# Patient Record
Sex: Female | Born: 1975 | Race: Black or African American | Hispanic: No | Marital: Single
Health system: Southern US, Community
[De-identification: ages and names within clinical notes are randomized; demographics above are authoritative.]

## PROBLEM LIST (undated history)

## (undated) ENCOUNTER — Emergency Department (HOSPITAL_COMMUNITY): Admission: EM | Payer: Medicaid Other | Source: Home / Self Care

## (undated) ENCOUNTER — Emergency Department (HOSPITAL_COMMUNITY): Admission: EM | Discharge status: Absent without leave | Payer: Medicaid Other

## (undated) DIAGNOSIS — I639 Cerebral infarction, unspecified: Secondary | ICD-10-CM

## (undated) DIAGNOSIS — E1100 Type 2 diabetes mellitus with hyperosmolarity without nonketotic hyperglycemic-hyperosmolar coma (NKHHC): Secondary | ICD-10-CM

## (undated) DIAGNOSIS — I69322 Dysarthria following cerebral infarction: Secondary | ICD-10-CM

## (undated) DIAGNOSIS — I10 Essential (primary) hypertension: Secondary | ICD-10-CM

## (undated) DIAGNOSIS — Z0279 Encounter for issue of other medical certificate: Secondary | ICD-10-CM

## (undated) DIAGNOSIS — F319 Bipolar disorder, unspecified: Secondary | ICD-10-CM

## (undated) DIAGNOSIS — Z91148 Patient's other noncompliance with medication regimen for other reason: Secondary | ICD-10-CM

## (undated) DIAGNOSIS — F1190 Opioid use, unspecified, uncomplicated: Secondary | ICD-10-CM

## (undated) DIAGNOSIS — D649 Anemia, unspecified: Secondary | ICD-10-CM

---

## 2008-12-15 ENCOUNTER — Emergency Department (HOSPITAL_COMMUNITY): Admission: EM | Admit: 2008-12-15 | Discharge: 2008-12-15 | Payer: Self-pay | Admitting: Emergency Medicine

## 2008-12-15 IMAGING — US US OB TRANSVAGINAL MODIFY
1 series · 14 of 28 positions shown · non-contrast
Comparison: None

CLINICAL DATA: Abdominal pain.  Pregnant.

OBSTETRIC <14 WK US AND TRANSVAGINAL OB US
TECHNIQUE: Both transabdominal and transvaginal ultrasound
examinations were performed for complete evaluation of the
gestation as well as the maternal uterus, adnexal regions, and
pelvic cul-de-sac.

[Series 1: us ob transvaginal modify · 0.28mm/px · 14 of 40 slices shown]
[im 2/40]
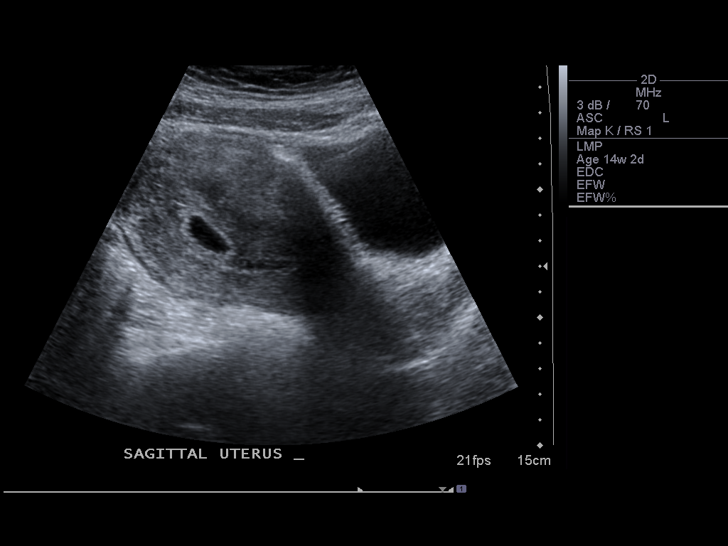
[im 5/40]
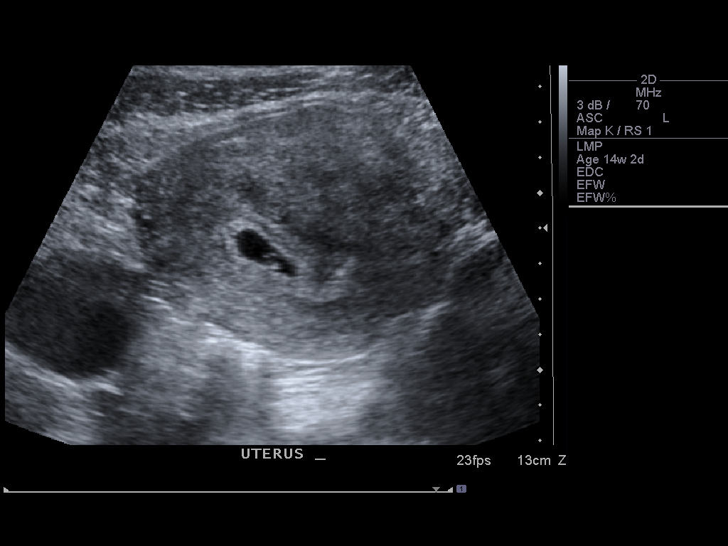
[im 8/40]
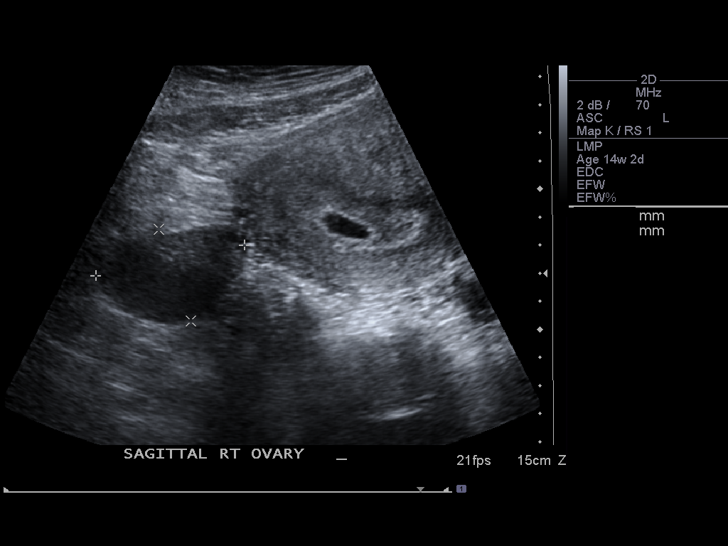
[im 11/40]
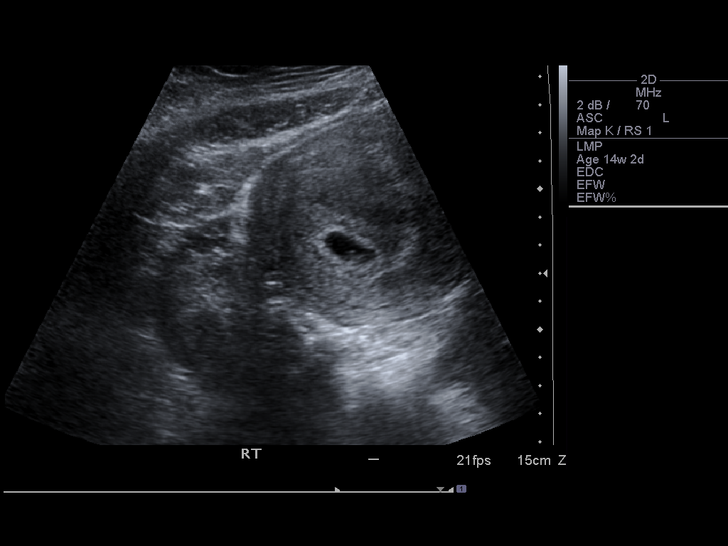
[im 14/40]
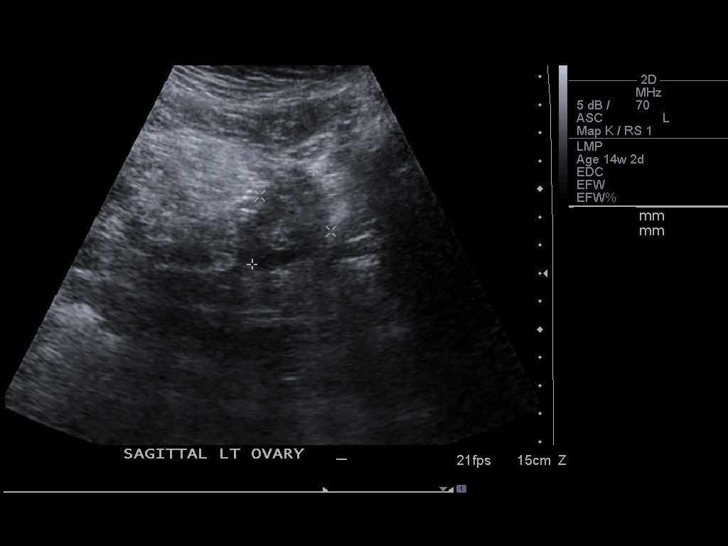
[im 16/40]
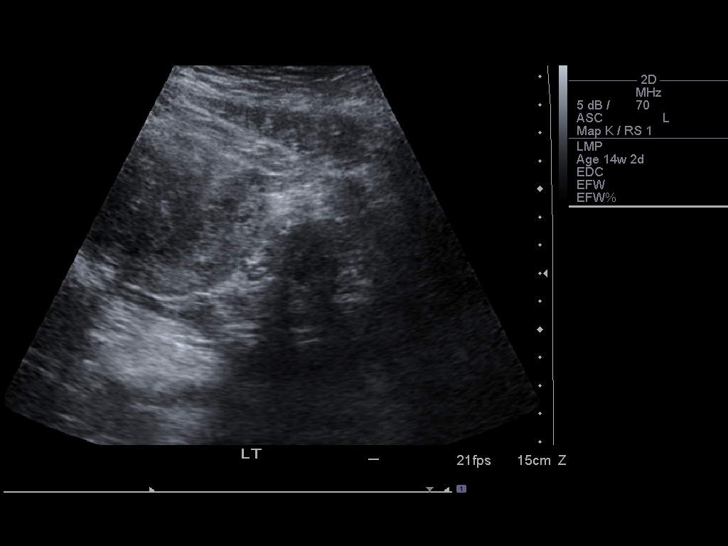
[im 19/40]
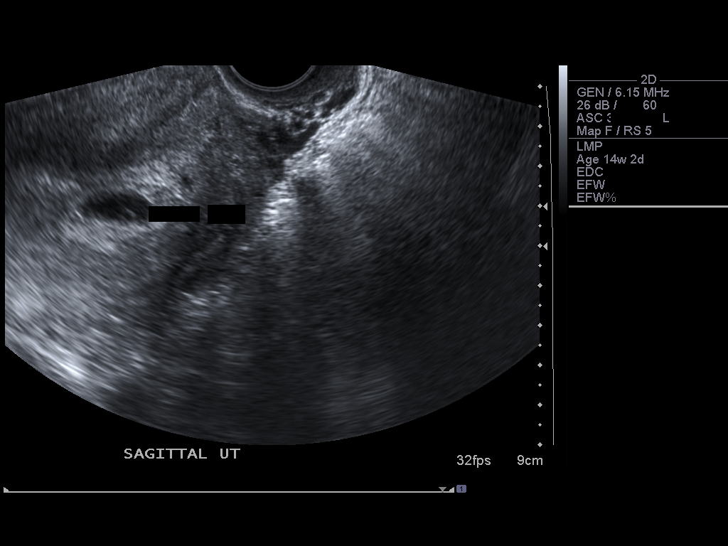
[im 22/40]
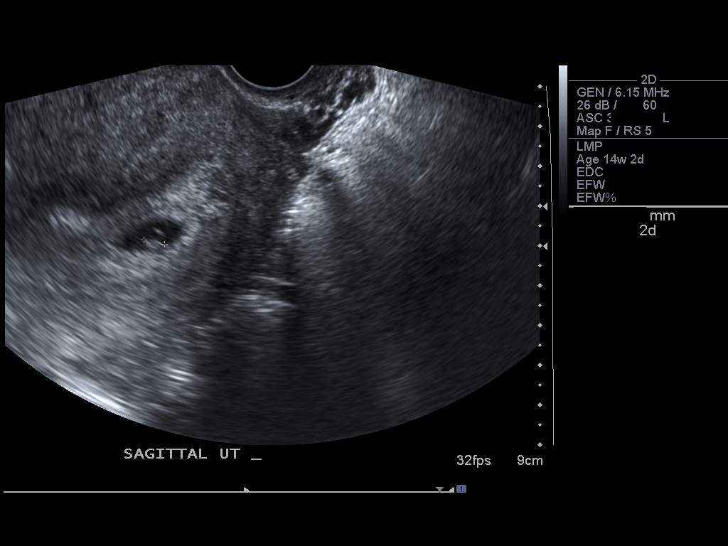
[im 25/40]
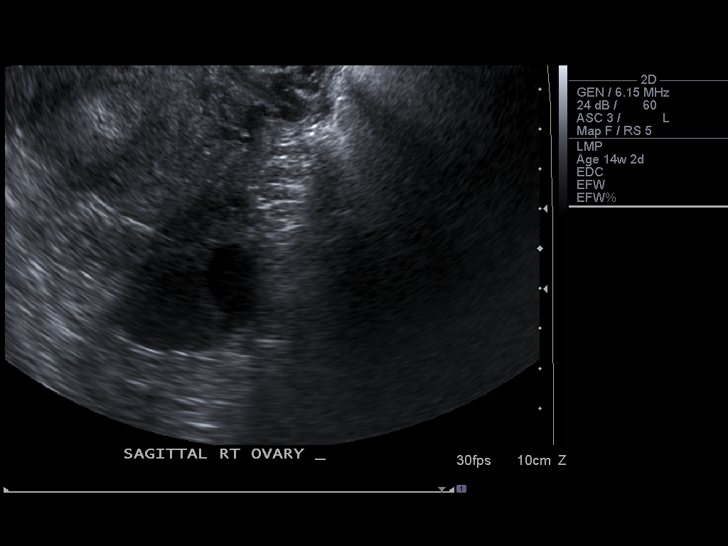
[im 28/40]
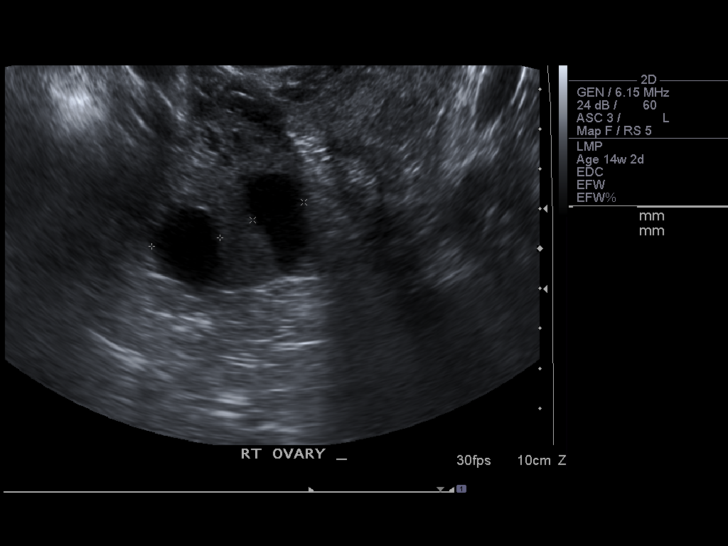
[im 31/40]
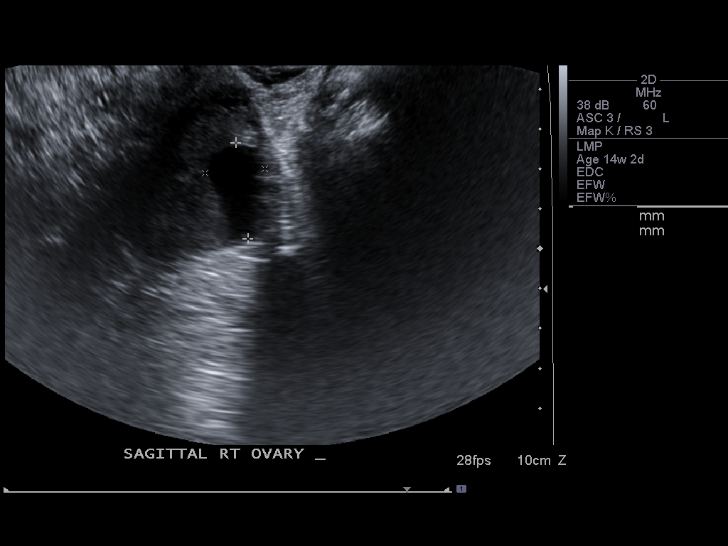
[im 34/40]
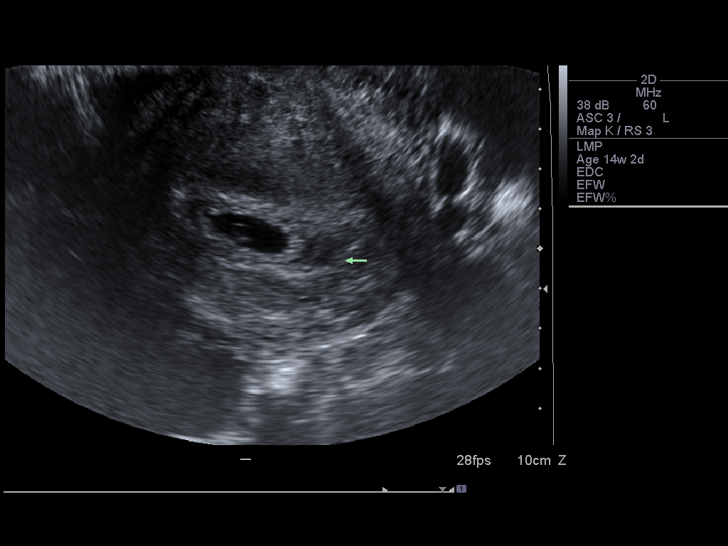
[im 37/40]
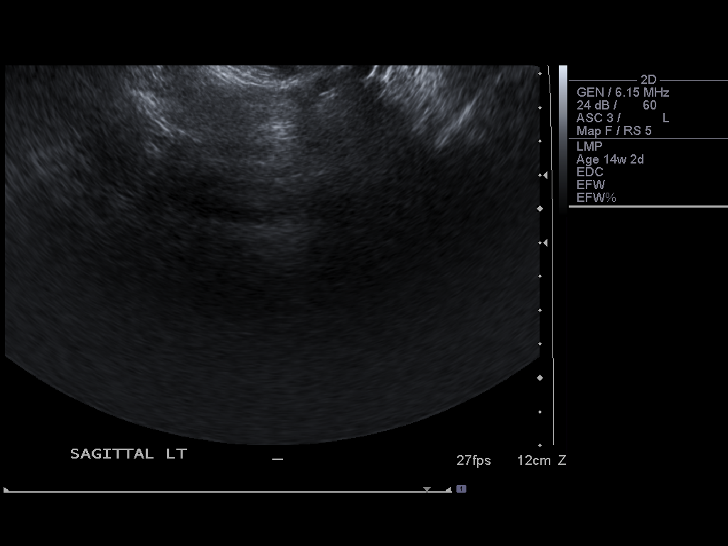
[im 40/40]
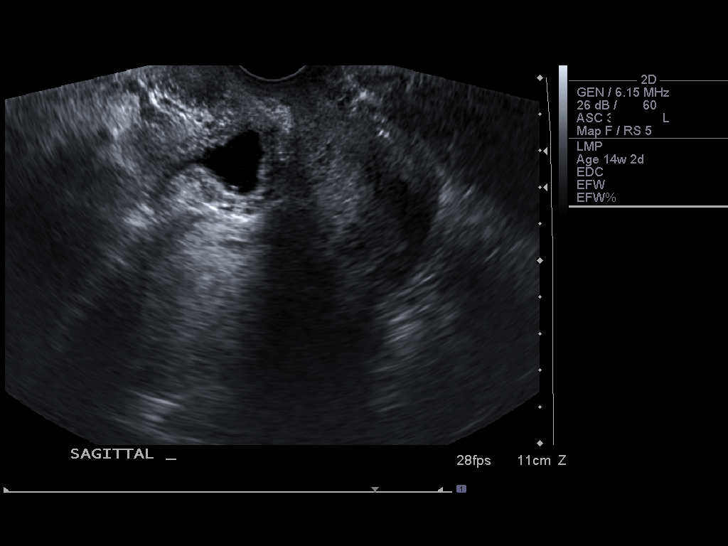

[14 of 28 positions shown; findings below may reference images not displayed]

Intrauterine gestational sac: Present
Yolk sac: Present
Embryo: Present
Cardiac Activity: Present
Heart Rate: 122 bpm

CRL: 5.6 mm           6   w  2   d        US EDC: [DATE]

Maternal uterus/adnexae:
2 cm right ovarian cyst is noted.  There is no free fluid.

Small subchorionic hemorrhage is noted.  Follow-up ultrasound is
suggested to evaluate for stability.
IMPRESSION: Intrauterine pregnancy 6-[RD]-day.

Small subchorionic hemorrhage.  Follow-up ultrasound suggested.

## 2008-12-26 ENCOUNTER — Inpatient Hospital Stay (HOSPITAL_COMMUNITY): Admission: AD | Admit: 2008-12-26 | Discharge: 2008-12-26 | Payer: Self-pay | Admitting: Obstetrics & Gynecology

## 2009-02-20 ENCOUNTER — Other Ambulatory Visit: Payer: Self-pay | Admitting: Emergency Medicine

## 2009-02-21 ENCOUNTER — Ambulatory Visit: Payer: Self-pay | Admitting: Psychiatry

## 2009-02-21 ENCOUNTER — Inpatient Hospital Stay (HOSPITAL_COMMUNITY): Admission: EM | Admit: 2009-02-21 | Discharge: 2009-02-26 | Payer: Self-pay | Admitting: Psychiatry

## 2009-02-28 ENCOUNTER — Encounter: Payer: Self-pay | Admitting: Advanced Practice Midwife

## 2009-02-28 ENCOUNTER — Ambulatory Visit: Payer: Self-pay | Admitting: Obstetrics and Gynecology

## 2009-02-28 ENCOUNTER — Encounter: Payer: Self-pay | Admitting: Obstetrics and Gynecology

## 2009-02-28 ENCOUNTER — Encounter: Payer: Self-pay | Admitting: Family Medicine

## 2009-02-28 LAB — CONVERTED CEMR LAB
Antibody Screen: NEGATIVE
Basophils Absolute: 0 10*3/uL (ref 0.0–0.1)
Basophils Relative: 0 % (ref 0–1)
Eosinophils Absolute: 0.1 10*3/uL (ref 0.0–0.7)
Eosinophils Relative: 2 % (ref 0–5)
HCT: 35.7 % — ABNORMAL LOW (ref 36.0–46.0)
Hgb A: 96.2 % — ABNORMAL LOW (ref 96.8–97.8)
Hgb S Quant: 0 % (ref 0.0–0.0)
Lymphocytes Relative: 22 % (ref 12–46)
Lymphs Abs: 1.4 10*3/uL (ref 0.7–4.0)
MCHC: 32.5 g/dL (ref 30.0–36.0)
MCV: 87.3 fL (ref 78.0–100.0)
Monocytes Absolute: 0.5 10*3/uL (ref 0.1–1.0)
RBC: 4.09 M/uL (ref 3.87–5.11)
RDW: 14.8 % (ref 11.5–15.5)
Rubella: 29.5 intl units/mL — ABNORMAL HIGH
WBC: 6.6 10*3/uL (ref 4.0–10.5)

## 2009-03-02 ENCOUNTER — Emergency Department (HOSPITAL_COMMUNITY): Admission: EM | Admit: 2009-03-02 | Discharge: 2009-03-03 | Payer: Self-pay | Admitting: Emergency Medicine

## 2009-03-03 ENCOUNTER — Inpatient Hospital Stay (HOSPITAL_COMMUNITY): Admission: RE | Admit: 2009-03-03 | Discharge: 2009-03-05 | Payer: Self-pay | Admitting: *Deleted

## 2009-03-10 ENCOUNTER — Emergency Department (HOSPITAL_COMMUNITY): Admission: EM | Admit: 2009-03-10 | Discharge: 2009-03-10 | Payer: Self-pay | Admitting: Emergency Medicine

## 2009-03-12 ENCOUNTER — Ambulatory Visit: Payer: Self-pay | Admitting: Obstetrics & Gynecology

## 2009-03-16 ENCOUNTER — Ambulatory Visit (HOSPITAL_COMMUNITY): Admission: RE | Admit: 2009-03-16 | Discharge: 2009-03-16 | Payer: Self-pay | Admitting: Obstetrics & Gynecology

## 2009-03-16 IMAGING — US US OB COMP +14 WK
1 series · 14 of 28 positions shown · non-contrast
Comparison: none

OBSTETRICAL ULTRASOUND:
 This ultrasound exam was performed in the [HOSPITAL] Ultrasound Department.  The OB US report was generated in the AS system, and faxed to the ordering physician.  This report is also available in [HOSPITAL]?s [employer] and in [REDACTED] PACS.

[Series 1: us ob comp +14 wk · 0.27mm/px · 14 of 65 slices shown]
[im 3/65]
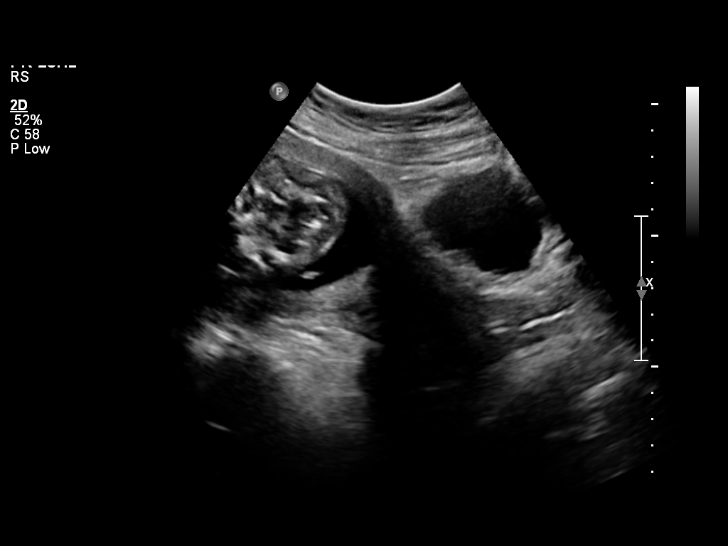
[im 8/65]
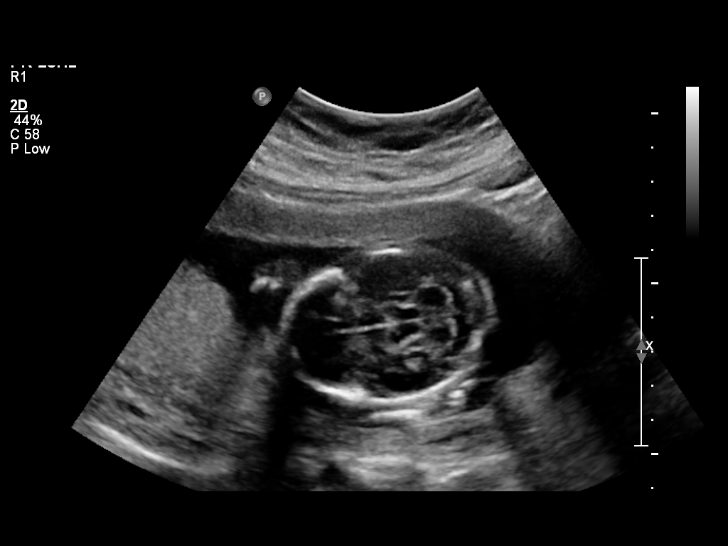
[im 12/65]
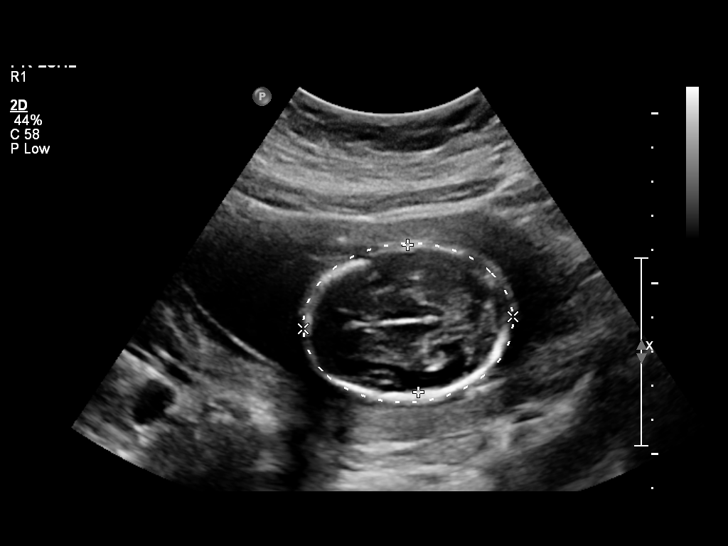
[im 17/65]
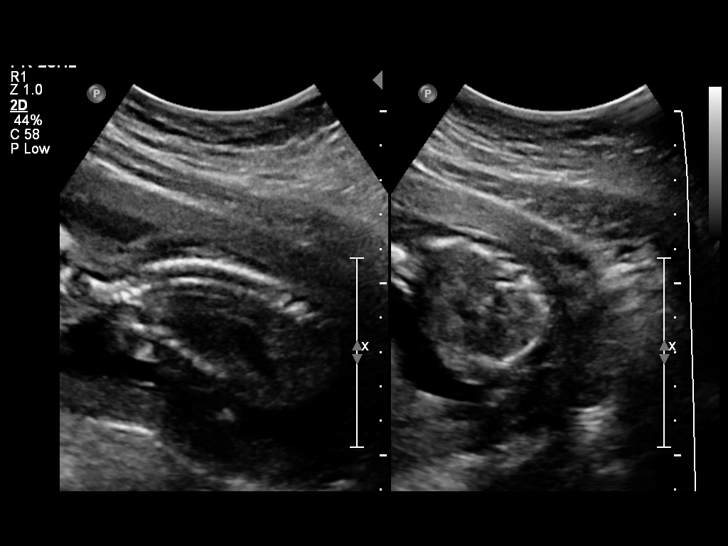
[im 22/65]
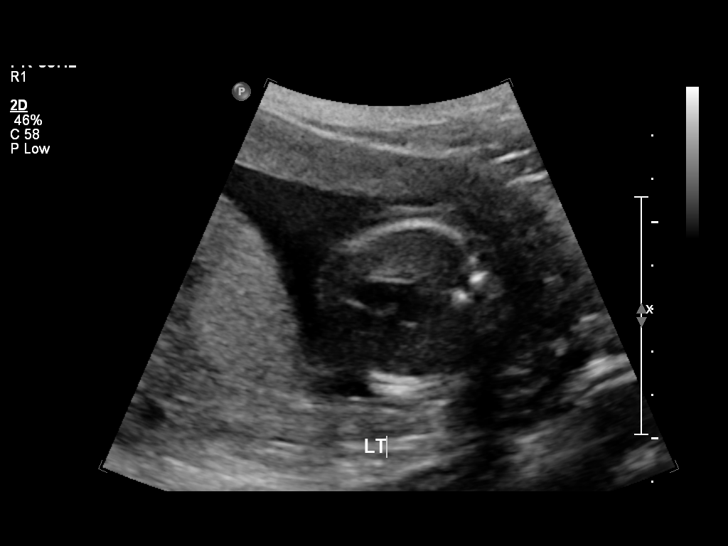
[im 27/65]
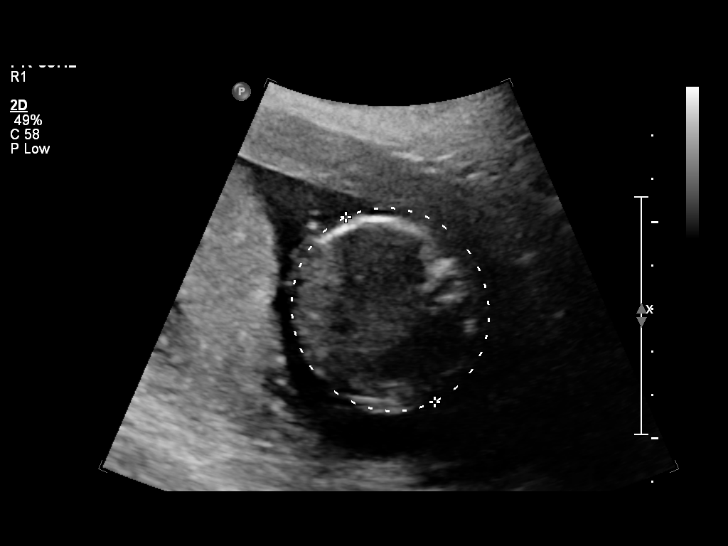
[im 31/65]
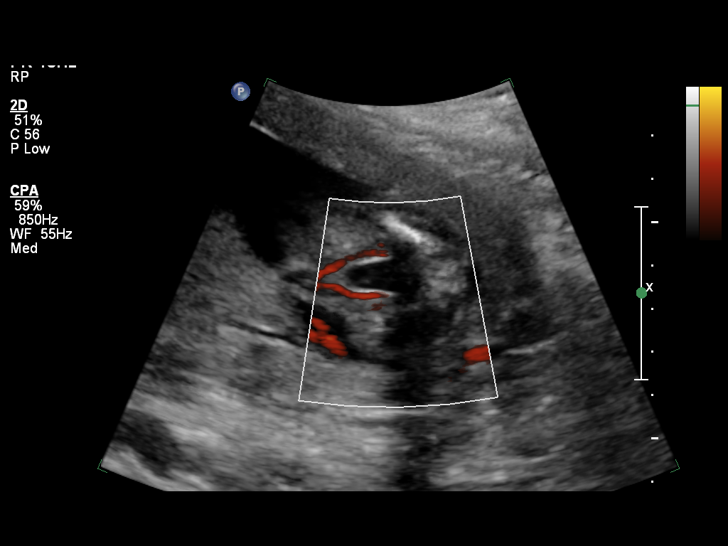
[im 36/65]
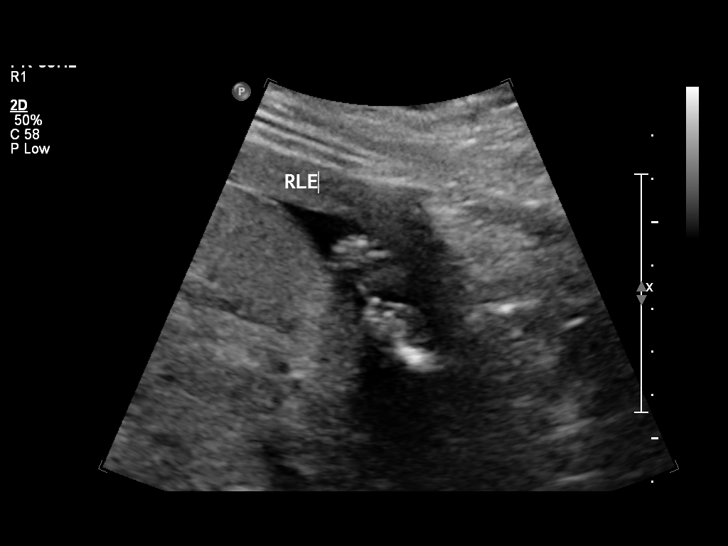
[im 41/65]
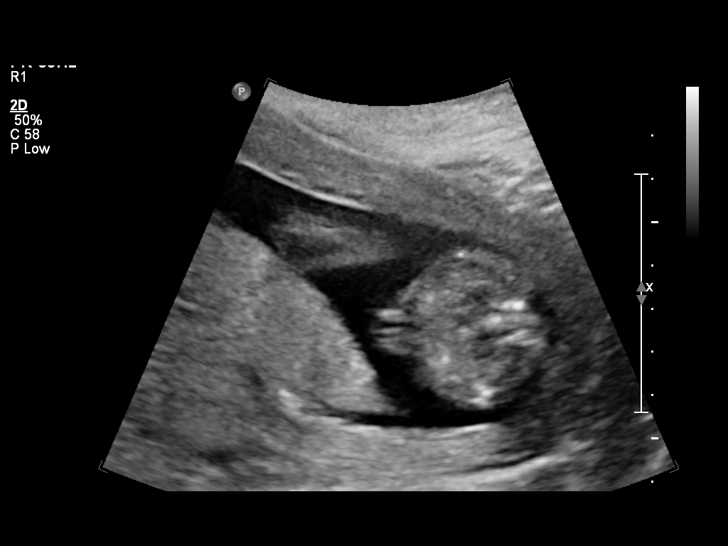
[im 46/65]
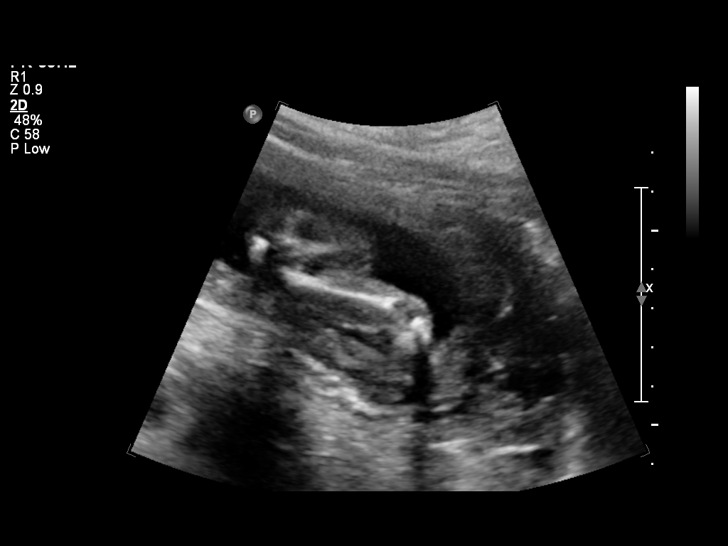
[im 50/65]
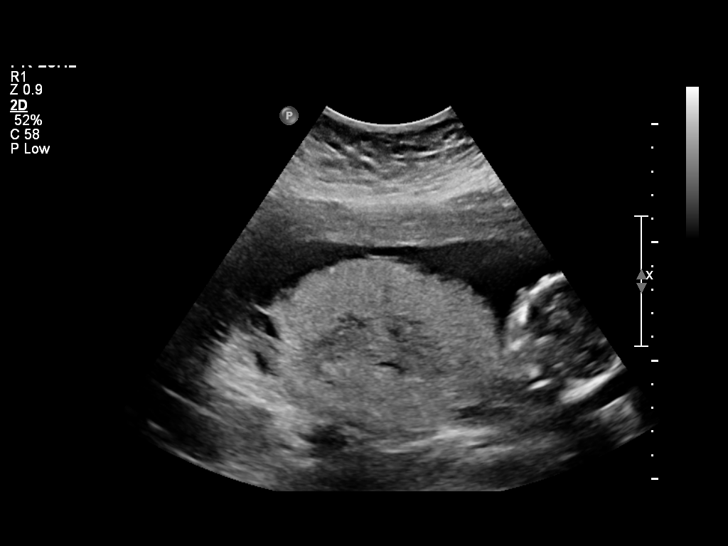
[im 55/65]
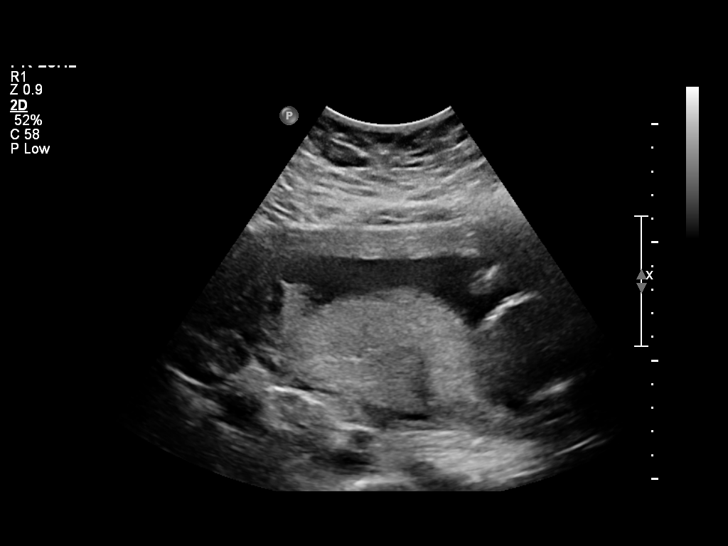
[im 60/65]
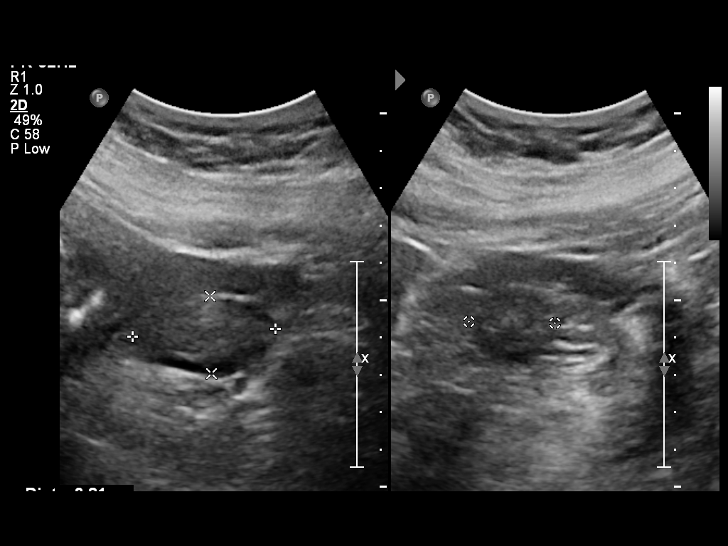
[im 65/65]
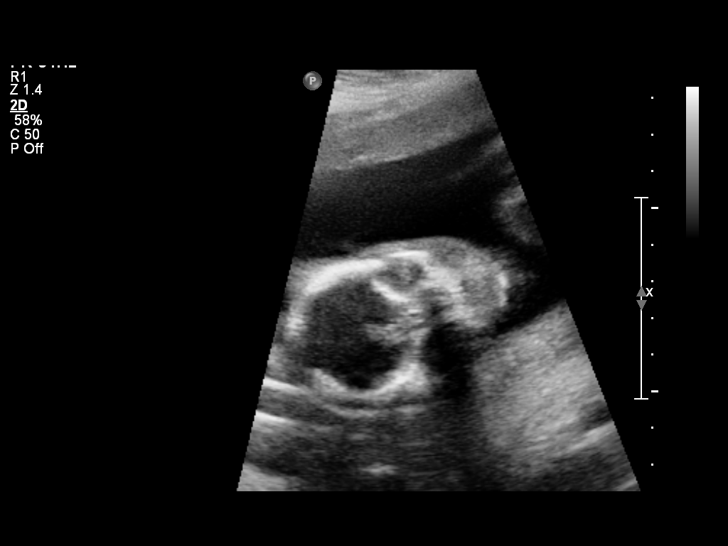

[14 of 28 positions shown; findings below may reference images not displayed]

IMPRESSION: See AS Obstetric US report.

## 2009-03-19 ENCOUNTER — Ambulatory Visit: Payer: Self-pay | Admitting: Obstetrics & Gynecology

## 2009-03-19 ENCOUNTER — Encounter: Admission: RE | Admit: 2009-03-19 | Discharge: 2009-06-13 | Payer: Self-pay | Admitting: Obstetrics and Gynecology

## 2009-03-26 ENCOUNTER — Emergency Department (HOSPITAL_COMMUNITY): Admission: EM | Admit: 2009-03-26 | Discharge: 2009-03-27 | Payer: Self-pay | Admitting: Emergency Medicine

## 2009-03-29 ENCOUNTER — Ambulatory Visit: Payer: Self-pay | Admitting: Obstetrics & Gynecology

## 2009-04-02 ENCOUNTER — Ambulatory Visit: Payer: Self-pay | Admitting: Obstetrics & Gynecology

## 2010-07-07 ENCOUNTER — Encounter: Payer: Self-pay | Admitting: *Deleted

## 2010-09-19 LAB — DIFFERENTIAL
Basophils Absolute: 0.1 10*3/uL (ref 0.0–0.1)
Eosinophils Absolute: 0.2 10*3/uL (ref 0.0–0.7)
Lymphocytes Relative: 34 % (ref 12–46)
Monocytes Absolute: 0.6 10*3/uL (ref 0.1–1.0)
Neutro Abs: 3.6 10*3/uL (ref 1.7–7.7)

## 2010-09-19 LAB — COMPREHENSIVE METABOLIC PANEL
AST: 15 U/L (ref 0–37)
Albumin: 2.9 g/dL — ABNORMAL LOW (ref 3.5–5.2)
Alkaline Phosphatase: 62 U/L (ref 39–117)
BUN: 5 mg/dL — ABNORMAL LOW (ref 6–23)
Chloride: 107 mEq/L (ref 96–112)
Creatinine, Ser: 0.53 mg/dL (ref 0.4–1.2)
GFR calc Af Amer: >60 mL/min (ref >60)
Potassium: 3.4 mEq/L — ABNORMAL LOW (ref 3.5–5.1)
Sodium: 135 mEq/L (ref 135–145)
Total Protein: 6.4 g/dL (ref 6.0–8.3)

## 2010-09-19 LAB — URINALYSIS, ROUTINE W REFLEX MICROSCOPIC
Bilirubin Urine: NEGATIVE
Protein, ur: NEGATIVE mg/dL
Specific Gravity, Urine: 1.033 — ABNORMAL HIGH (ref 1.005–1.030)
Urobilinogen, UA: 0.2 mg/dL (ref 0.0–1.0)

## 2010-09-19 LAB — CBC
HCT: 32.4 % — ABNORMAL LOW (ref 36.0–46.0)
Hemoglobin: 10.8 g/dL — ABNORMAL LOW (ref 12.0–15.0)
MCHC: 33.3 g/dL (ref 30.0–36.0)
MCV: 87.9 fL (ref 78.0–100.0)
Platelets: 220 10*3/uL (ref 150–400)
RBC: 3.69 MIL/uL — ABNORMAL LOW (ref 3.87–5.11)
RDW: 14.7 % (ref 11.5–15.5)
WBC: 6.8 10*3/uL (ref 4.0–10.5)

## 2010-09-19 LAB — POCT URINALYSIS DIP (DEVICE)
Bilirubin Urine: NEGATIVE
Hgb urine dipstick: NEGATIVE
Hgb urine dipstick: NEGATIVE
Ketones, ur: NEGATIVE mg/dL
Ketones, ur: NEGATIVE mg/dL
Nitrite: NEGATIVE
Protein, ur: NEGATIVE mg/dL
Urobilinogen, UA: 0.2 mg/dL (ref 0.0–1.0)
pH: 5.5 (ref 5.0–8.0)

## 2010-09-19 LAB — RAPID URINE DRUG SCREEN, HOSP PERFORMED
Opiates: NOT DETECTED
Tetrahydrocannabinol: POSITIVE — AB

## 2010-09-19 LAB — ETHANOL: Alcohol, Ethyl (B): <5 mg/dL (ref 0–10)

## 2010-09-20 LAB — CBC
Hemoglobin: 11 g/dL — ABNORMAL LOW (ref 12.0–15.0)
Hemoglobin: 10.3 g/dL — ABNORMAL LOW (ref 12.0–15.0)
MCHC: 33.2 g/dL (ref 30.0–36.0)
MCHC: 33.5 g/dL (ref 30.0–36.0)
MCV: 87.1 fL (ref 78.0–100.0)
MCV: 87.5 fL (ref 78.0–100.0)
Platelets: 228 10*3/uL (ref 150–400)
Platelets: 223 10*3/uL (ref 150–400)
RBC: 3.81 MIL/uL — ABNORMAL LOW (ref 3.87–5.11)
RBC: 3.52 MIL/uL — ABNORMAL LOW (ref 3.87–5.11)
RDW: 14.7 % (ref 11.5–15.5)
RDW: 14.8 % (ref 11.5–15.5)
WBC: 5.9 10*3/uL (ref 4.0–10.5)
WBC: 6.3 10*3/uL (ref 4.0–10.5)

## 2010-09-20 LAB — DIFFERENTIAL
Basophils Absolute: 0.1 10*3/uL (ref 0.0–0.1)
Basophils Absolute: 0 10*3/uL (ref 0.0–0.1)
Eosinophils Absolute: 0.2 10*3/uL (ref 0.0–0.7)
Eosinophils Relative: 3 % (ref 0–5)
Lymphs Abs: 1.4 10*3/uL (ref 0.7–4.0)
Lymphs Abs: 1.7 10*3/uL (ref 0.7–4.0)
Monocytes Absolute: 0.5 10*3/uL (ref 0.1–1.0)
Monocytes Relative: 8 % (ref 3–12)
Neutro Abs: 3.9 10*3/uL (ref 1.7–7.7)
Neutrophils Relative %: 66 % (ref 43–77)
Neutrophils Relative %: 61 % (ref 43–77)

## 2010-09-20 LAB — BASIC METABOLIC PANEL
Chloride: 106 mEq/L (ref 96–112)
Creatinine, Ser: 0.57 mg/dL (ref 0.4–1.2)
GFR calc non Af Amer: >60 mL/min (ref >60)
Glucose, Bld: 105 mg/dL — ABNORMAL HIGH (ref 70–99)
Potassium: 3.7 mEq/L (ref 3.5–5.1)
Sodium: 134 mEq/L — ABNORMAL LOW (ref 135–145)

## 2010-09-20 LAB — RAPID URINE DRUG SCREEN, HOSP PERFORMED
Amphetamines: NOT DETECTED
Amphetamines: NOT DETECTED
Barbiturates: NOT DETECTED
Tetrahydrocannabinol: NOT DETECTED
Tetrahydrocannabinol: NOT DETECTED

## 2010-09-20 LAB — URINALYSIS, ROUTINE W REFLEX MICROSCOPIC
Bilirubin Urine: NEGATIVE
Ketones, ur: >80 mg/dL — AB
Nitrite: NEGATIVE
Protein, ur: NEGATIVE mg/dL
Specific Gravity, Urine: 1.029 (ref 1.005–1.030)
Urobilinogen, UA: 1 mg/dL (ref 0.0–1.0)

## 2010-09-20 LAB — POCT URINALYSIS DIP (DEVICE)
Nitrite: NEGATIVE
Protein, ur: 30 mg/dL — AB
Specific Gravity, Urine: >=1.03 (ref 1.005–1.030)
Urobilinogen, UA: 0.2 mg/dL (ref 0.0–1.0)
Urobilinogen, UA: 0.2 mg/dL (ref 0.0–1.0)
pH: 5 (ref 5.0–8.0)
pH: 5 (ref 5.0–8.0)

## 2010-09-20 LAB — POCT PREGNANCY, URINE: Preg Test, Ur: POSITIVE

## 2010-09-20 LAB — ETHANOL: Alcohol, Ethyl (B): <5 mg/dL (ref 0–10)

## 2010-09-22 LAB — COMPREHENSIVE METABOLIC PANEL
ALT: 21 U/L (ref 0–35)
AST: 19 U/L (ref 0–37)
Albumin: 3.3 g/dL — ABNORMAL LOW (ref 3.5–5.2)
CO2: 25 mEq/L (ref 19–32)
Calcium: 8.7 mg/dL (ref 8.4–10.5)
Chloride: 105 mEq/L (ref 96–112)
GFR calc Af Amer: >60 mL/min (ref >60)
GFR calc non Af Amer: >60 mL/min (ref >60)
Sodium: 134 mEq/L — ABNORMAL LOW (ref 135–145)
Total Bilirubin: 0.5 mg/dL (ref 0.3–1.2)

## 2010-09-22 LAB — URINALYSIS, ROUTINE W REFLEX MICROSCOPIC
Bilirubin Urine: NEGATIVE
Ketones, ur: NEGATIVE mg/dL
Nitrite: NEGATIVE
Protein, ur: NEGATIVE mg/dL
Specific Gravity, Urine: 1.02 (ref 1.005–1.030)
Urobilinogen, UA: 0.2 mg/dL (ref 0.0–1.0)

## 2010-09-22 LAB — CBC
Platelets: 224 10*3/uL (ref 150–400)
RBC: 3.99 MIL/uL (ref 3.87–5.11)
WBC: 5.7 10*3/uL (ref 4.0–10.5)

## 2010-09-22 LAB — DIFFERENTIAL
Eosinophils Absolute: 0.1 10*3/uL (ref 0.0–0.7)
Eosinophils Relative: 2 % (ref 0–5)
Lymphocytes Relative: 32 % (ref 12–46)
Lymphs Abs: 1.8 10*3/uL (ref 0.7–4.0)
Monocytes Absolute: 0.5 10*3/uL (ref 0.1–1.0)

## 2010-09-23 LAB — URINALYSIS, ROUTINE W REFLEX MICROSCOPIC
Bilirubin Urine: NEGATIVE
Hgb urine dipstick: NEGATIVE
Ketones, ur: NEGATIVE mg/dL
Protein, ur: NEGATIVE mg/dL
Urobilinogen, UA: 0.2 mg/dL (ref 0.0–1.0)

## 2010-09-23 LAB — GC/CHLAMYDIA PROBE AMP, GENITAL
Chlamydia, DNA Probe: NEGATIVE
GC Probe Amp, Genital: NEGATIVE

## 2010-09-23 LAB — WET PREP, GENITAL: Trich, Wet Prep: NONE SEEN

## 2010-09-23 LAB — RPR: RPR Ser Ql: NONREACTIVE

## 2012-12-25 ENCOUNTER — Encounter (HOSPITAL_COMMUNITY): Payer: Self-pay | Admitting: *Deleted

## 2012-12-25 ENCOUNTER — Emergency Department (HOSPITAL_COMMUNITY): Payer: Medicaid - Out of State

## 2012-12-25 ENCOUNTER — Emergency Department (HOSPITAL_COMMUNITY)
Admission: EM | Admit: 2012-12-25 | Discharge: 2012-12-25 | Disposition: A | Discharge status: Home / Self Care | Payer: Medicaid - Out of State | Attending: Emergency Medicine | Admitting: Emergency Medicine

## 2012-12-25 DIAGNOSIS — Z79899 Other long term (current) drug therapy: Secondary | ICD-10-CM | POA: Insufficient documentation

## 2012-12-25 DIAGNOSIS — X500XXA Overexertion from strenuous movement or load, initial encounter: Secondary | ICD-10-CM | POA: Insufficient documentation

## 2012-12-25 DIAGNOSIS — R071 Chest pain on breathing: Secondary | ICD-10-CM | POA: Insufficient documentation

## 2012-12-25 DIAGNOSIS — Y9389 Activity, other specified: Secondary | ICD-10-CM | POA: Insufficient documentation

## 2012-12-25 DIAGNOSIS — Z91040 Latex allergy status: Secondary | ICD-10-CM | POA: Insufficient documentation

## 2012-12-25 DIAGNOSIS — E669 Obesity, unspecified: Secondary | ICD-10-CM | POA: Insufficient documentation

## 2012-12-25 DIAGNOSIS — M549 Dorsalgia, unspecified: Secondary | ICD-10-CM

## 2012-12-25 DIAGNOSIS — Y929 Unspecified place or not applicable: Secondary | ICD-10-CM | POA: Insufficient documentation

## 2012-12-25 DIAGNOSIS — Z3202 Encounter for pregnancy test, result negative: Secondary | ICD-10-CM | POA: Insufficient documentation

## 2012-12-25 DIAGNOSIS — F17200 Nicotine dependence, unspecified, uncomplicated: Secondary | ICD-10-CM | POA: Insufficient documentation

## 2012-12-25 DIAGNOSIS — M62838 Other muscle spasm: Secondary | ICD-10-CM

## 2012-12-25 LAB — URINALYSIS, ROUTINE W REFLEX MICROSCOPIC
Bilirubin Urine: NEGATIVE
Glucose, UA: NEGATIVE mg/dL
Hgb urine dipstick: NEGATIVE
Ketones, ur: NEGATIVE mg/dL
Leukocytes, UA: NEGATIVE
Nitrite: NEGATIVE
Protein, ur: NEGATIVE mg/dL
Specific Gravity, Urine: 1.017 (ref 1.005–1.030)
Urobilinogen, UA: 0.2 mg/dL (ref 0.0–1.0)
pH: 7.5 (ref 5.0–8.0)

## 2012-12-25 LAB — PREGNANCY, URINE: Preg Test, Ur: NEGATIVE

## 2012-12-25 IMAGING — CR CHEST - 2 VIEW
2 series · 2 of 2 positions shown · non-contrast
Comparison: Two view chest radiograph
COMPARISON: Two view chest radiograph

Addendum:
CLINICAL DATA: Shortness of breath

EXAM:
CHEST - 2 VIEW

[w chest pa]
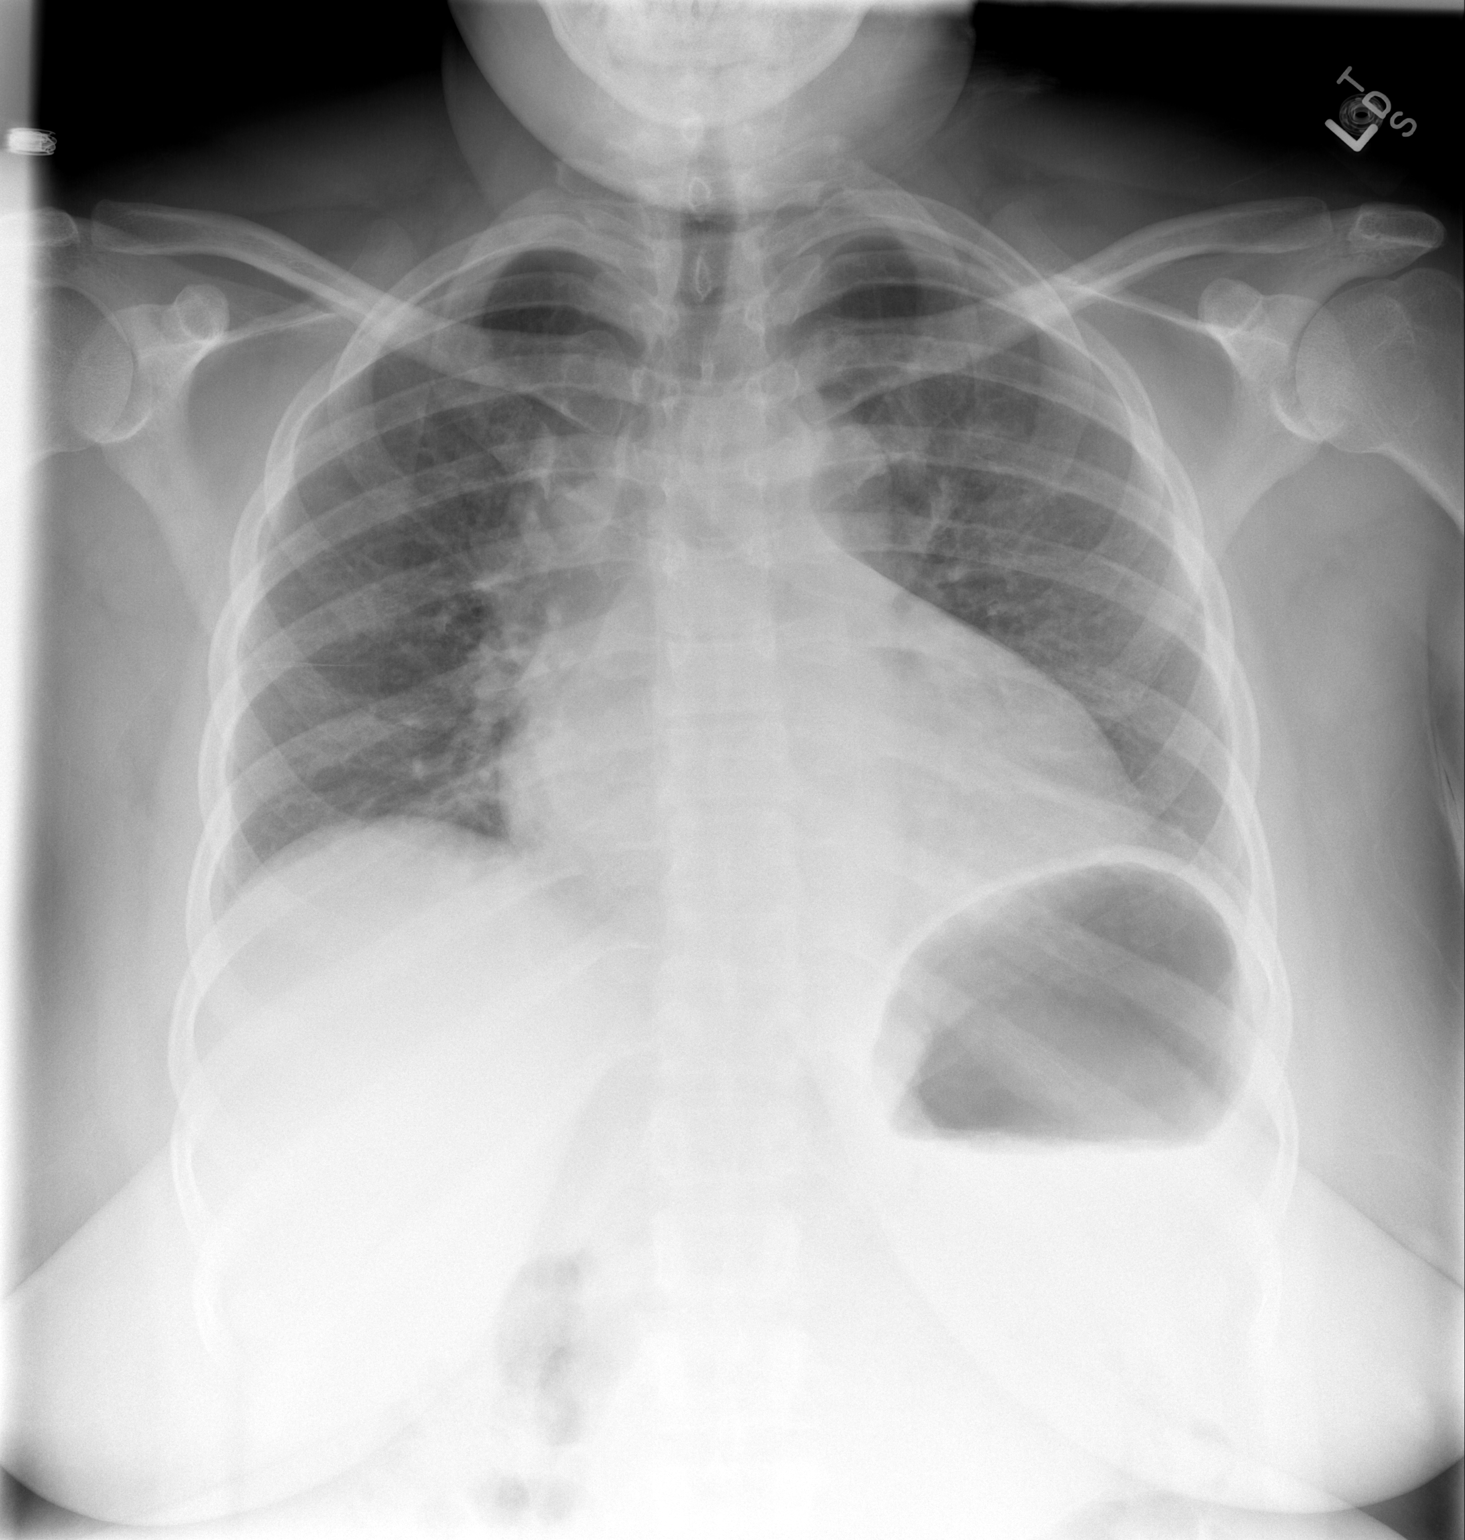

[w chest lat]
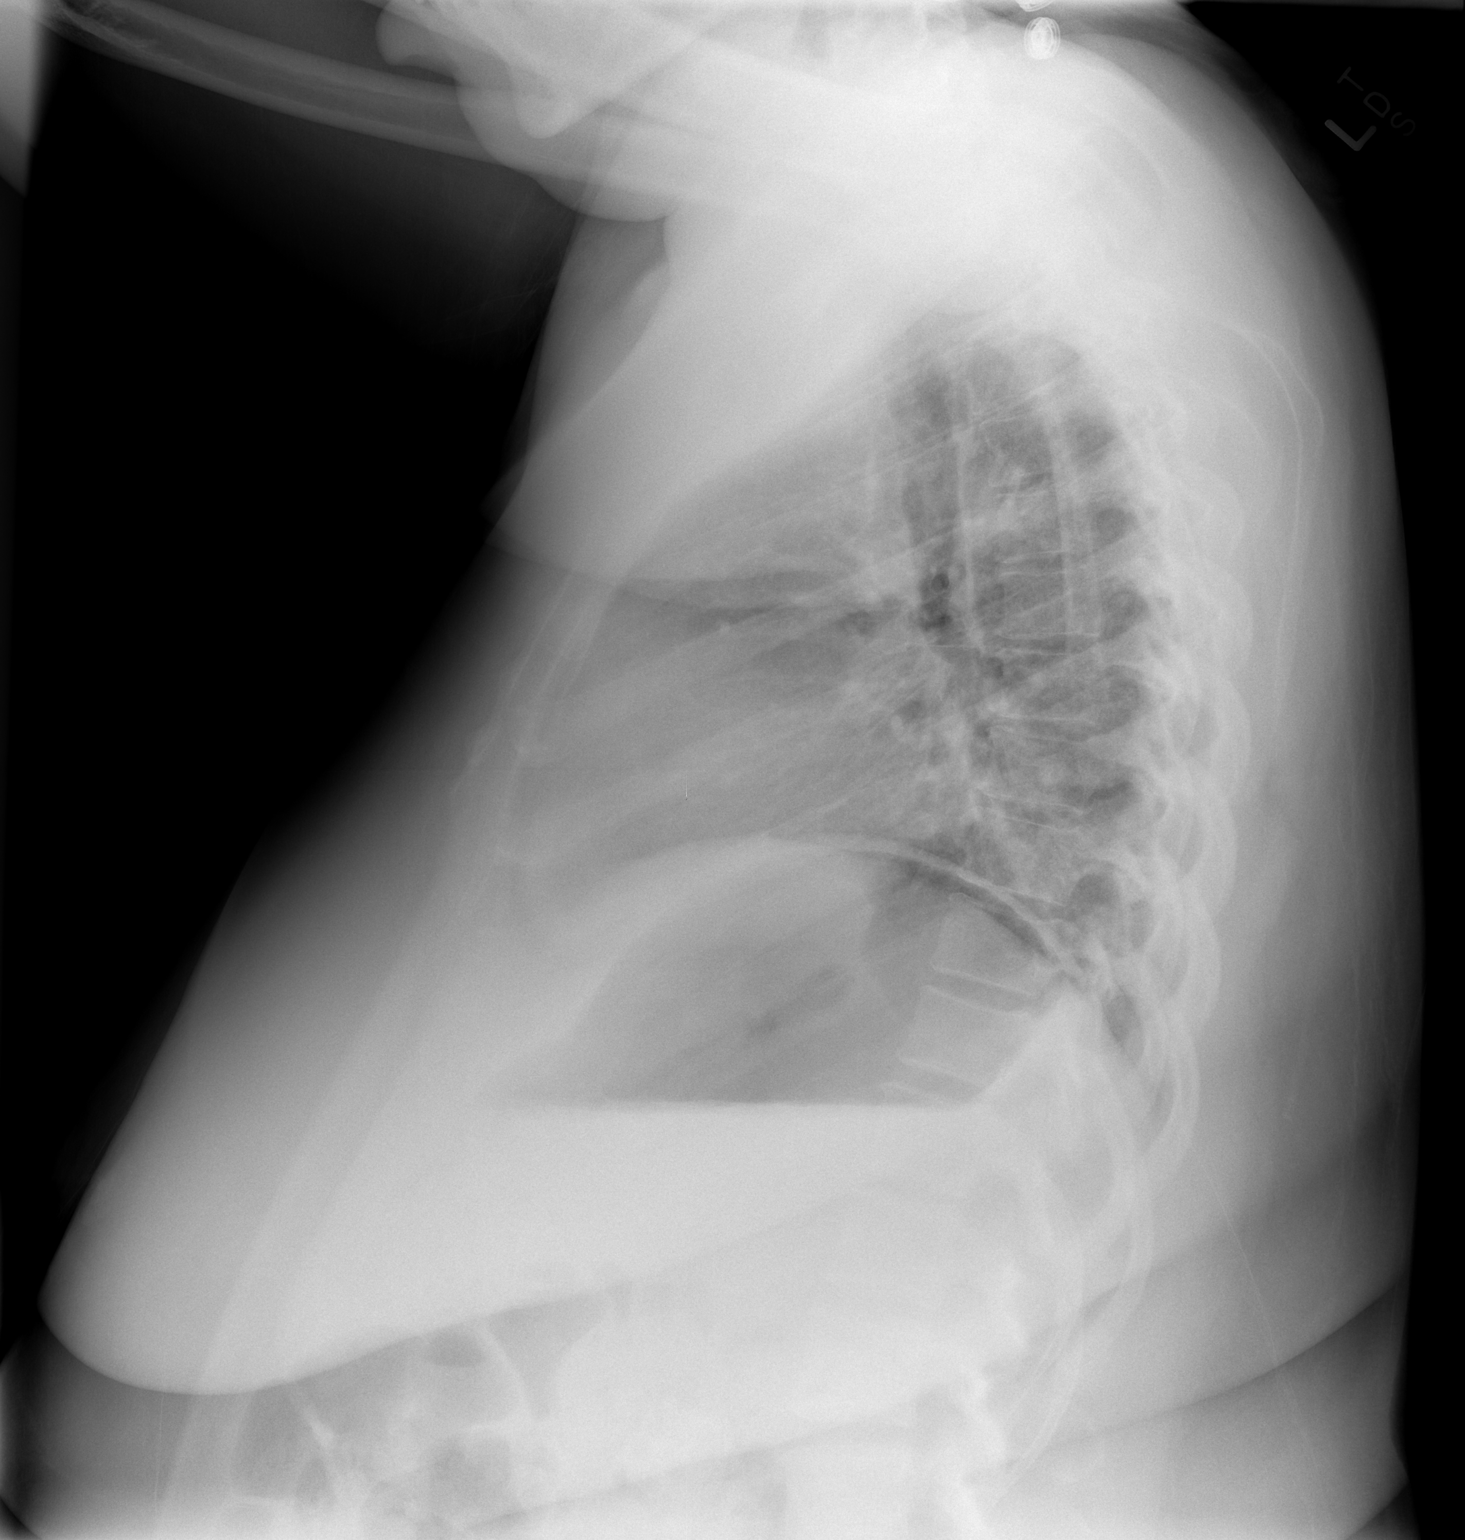

[2 of 2 positions shown; findings below may reference images not displayed]

FINDINGS: Study dictated during downtime

Cardiomegaly and aortic tortuosity. Low volume chest with
interstitial crowding. There is interstitial prominence which is
likely bronchitic. Airway thickening and air trapping was seen on
prior chest CT. No Kerley lines or effusion. No pneumothorax.
IMPRESSION: Cardiomegaly and bronchitic markings.

ADDENDUM:
Please disregard this report for this date of service. Was attached
in error.

*** End of Addendum ***
FINDINGS: Study dictated during downtime

Cardiomegaly and aortic tortuosity. Low volume chest with
interstitial crowding. There is interstitial prominence which is
likely bronchitic. Airway thickening and air trapping was seen on
prior chest CT. No Kerley lines or effusion. No pneumothorax.
IMPRESSION: Cardiomegaly and bronchitic markings.

## 2012-12-25 IMAGING — CT CT ABD-PELV W/O CM
2 of 4 series · 17 of 46 positions shown, 19 images · non-contrast
Comparison: None.

CLINICAL DATA: Right flank pain and back pain.  Previous tubal
ligation.

CT ABDOMEN AND PELVIS WITHOUT CONTRAST
TECHNIQUE: Multidetector CT imaging of the abdomen and pelvis was
performed following the standard protocol without intravenous
contrast.

[Series 2: stone study 5.0 i30f 1 · axial · 0.95mm/px · z∈[-653,-213]mm · 14 of 96 slices shown, 16 images]
[im 4/96  soft-tissue]
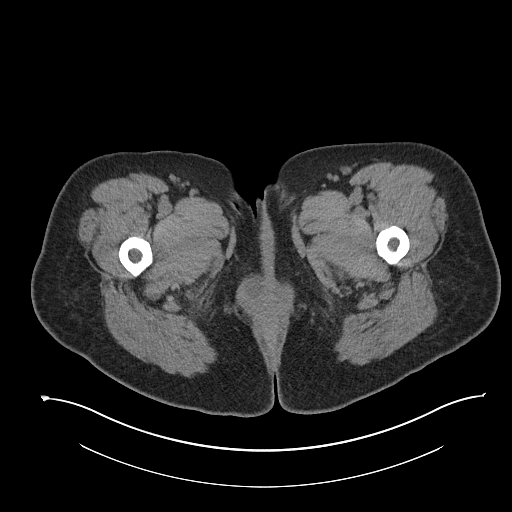
[im 4/96  bone]
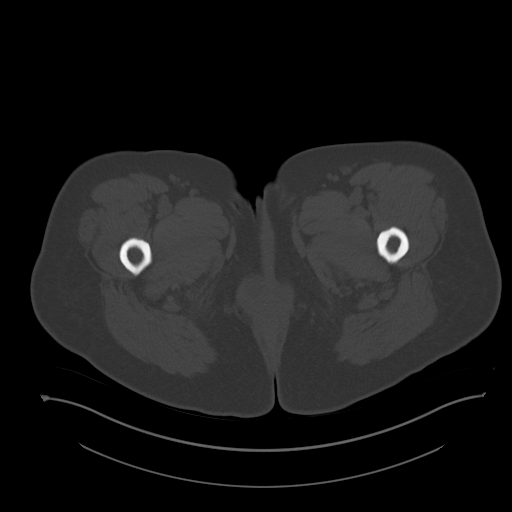
[im 11/96  soft-tissue]
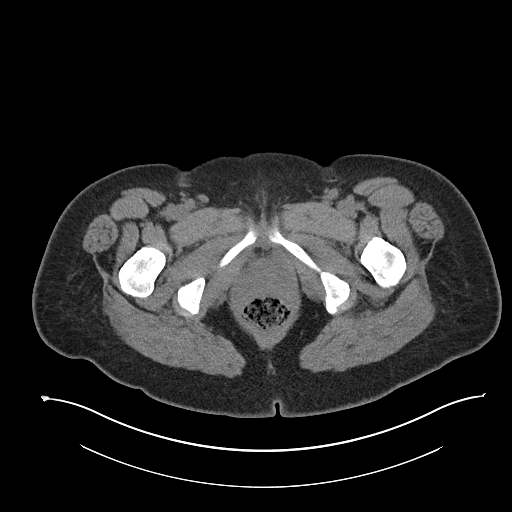
[im 19/96  soft-tissue]
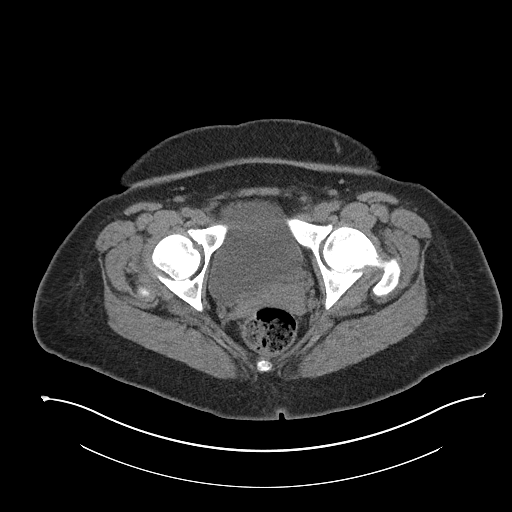
[im 26/96  soft-tissue]
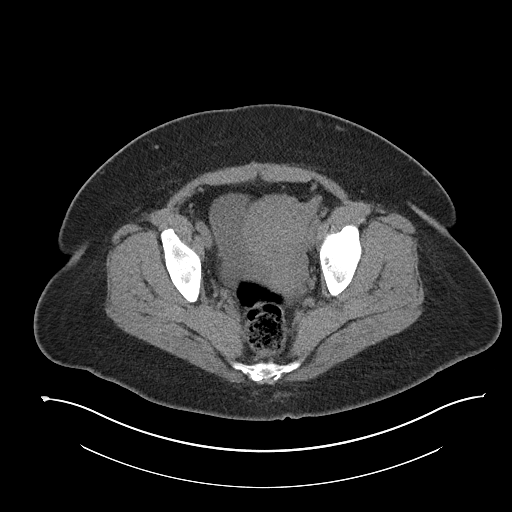
[im 33/96  soft-tissue]
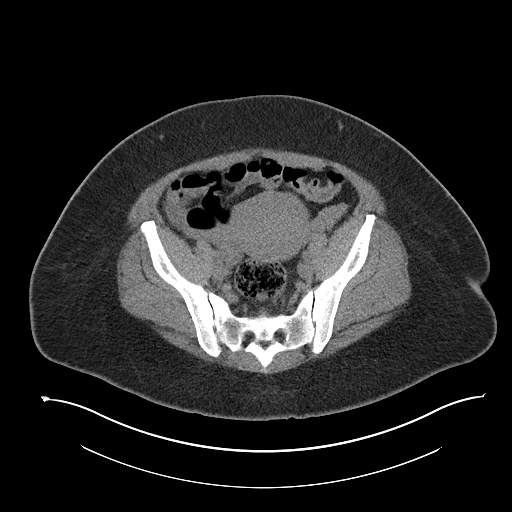
[im 37/96  soft-tissue]
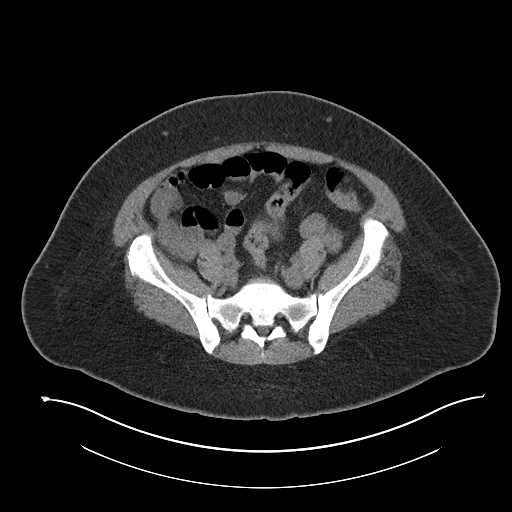
[im 44/96  soft-tissue]
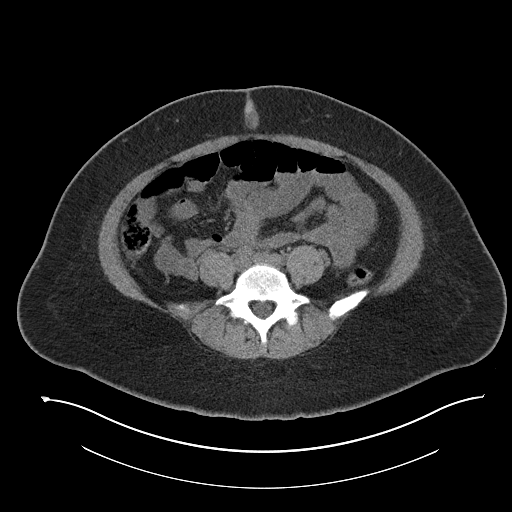
[im 52/96  soft-tissue]
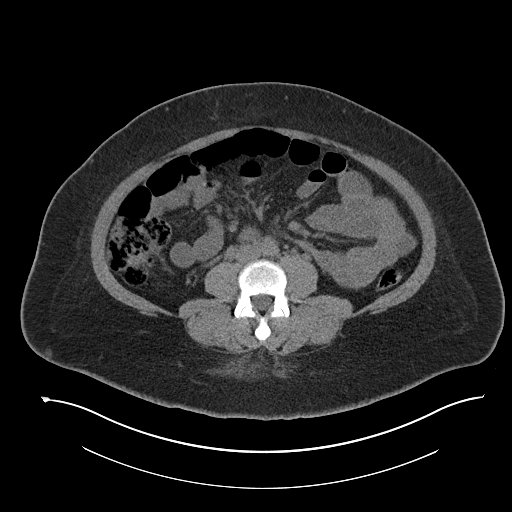
[im 59/96  soft-tissue]
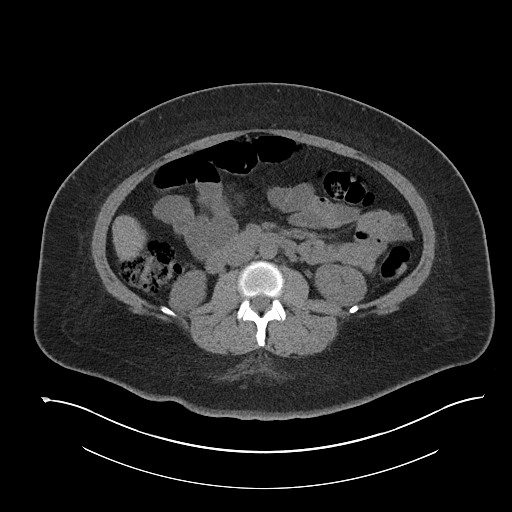
[im 59/96  bone]
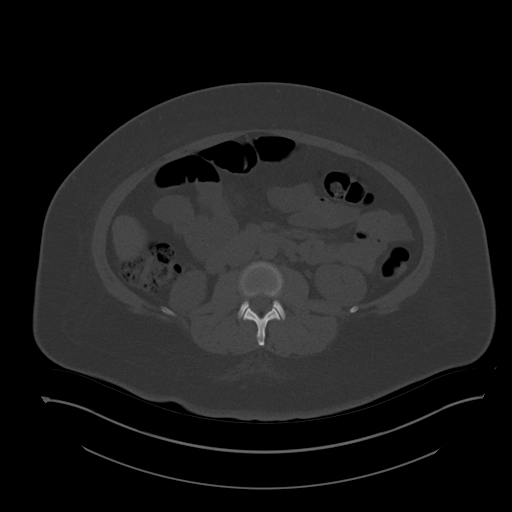
[im 63/96  soft-tissue]
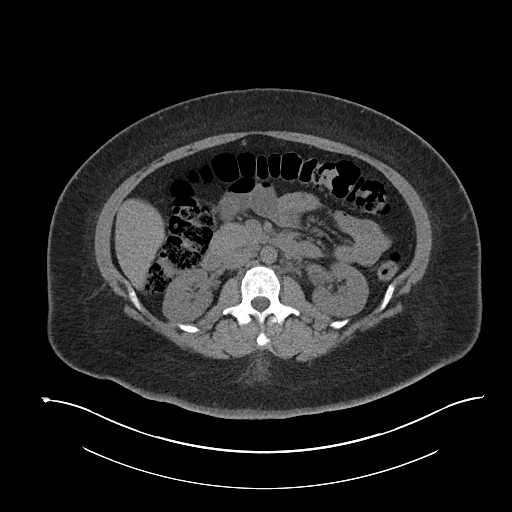
[im 70/96  soft-tissue]
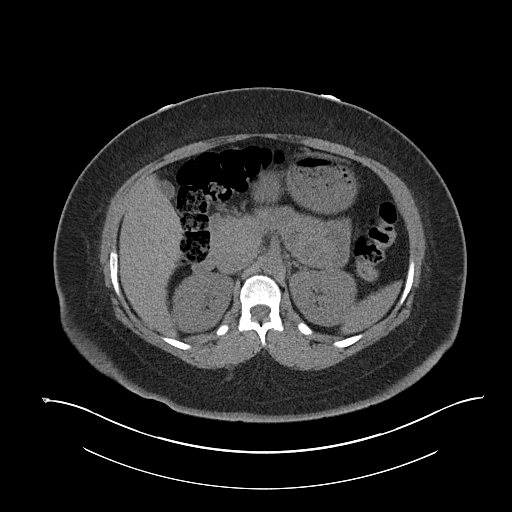
[im 77/96  soft-tissue]
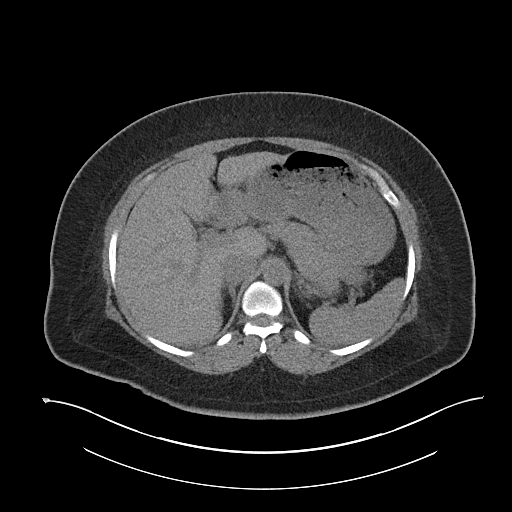
[im 85/96  soft-tissue]
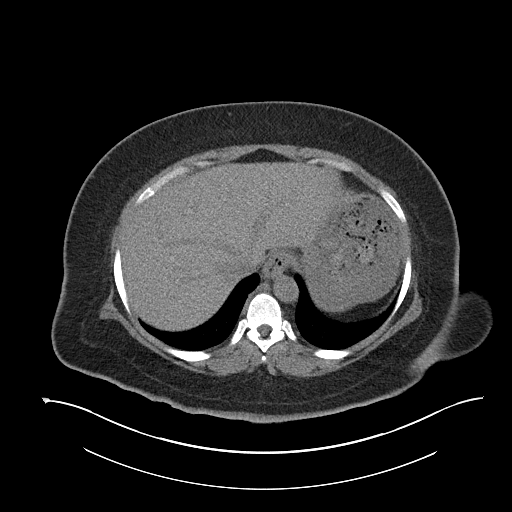
[im 92/96  soft-tissue]
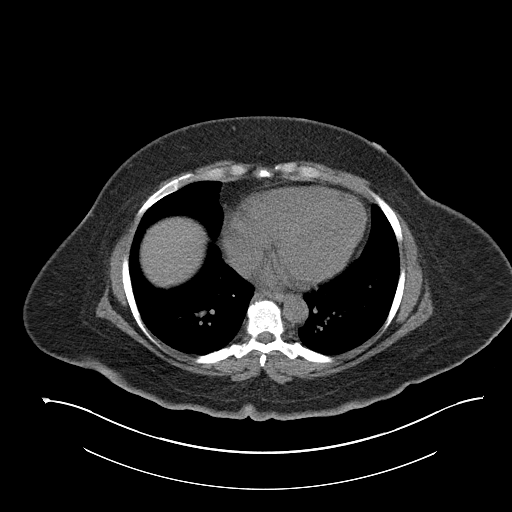

[Series 5: coronal soft tissue · coronal · 0.90mm/px · 3 of 109 slices shown]
[im 37/109  soft-tissue]
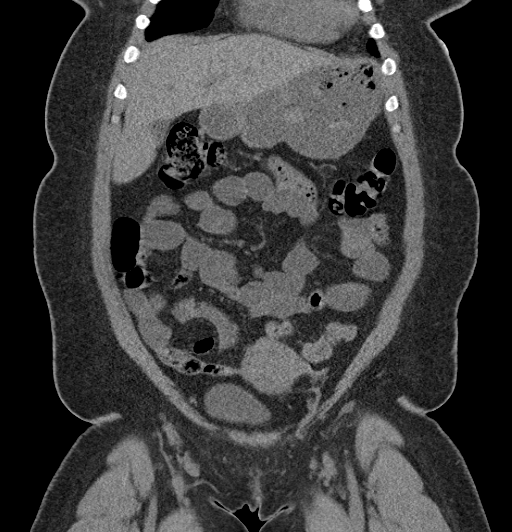
[im 49/109  soft-tissue]
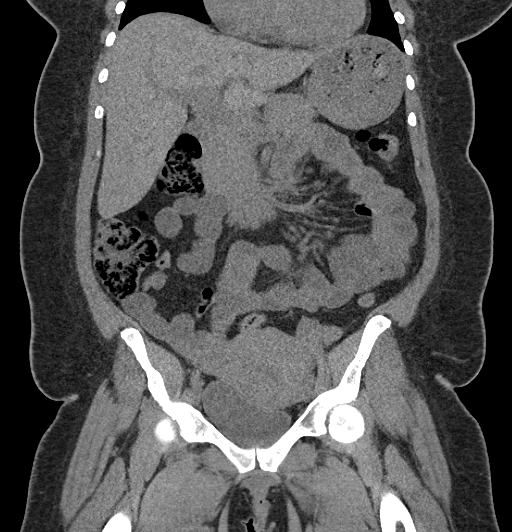
[im 61/109  soft-tissue]
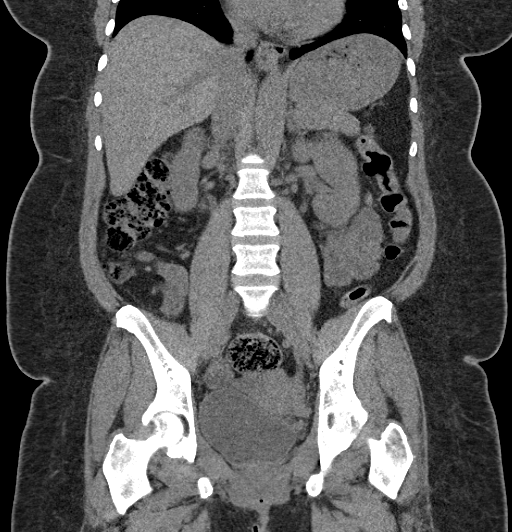

[17 of 46 positions shown; findings below may reference images not displayed]

FINDINGS: Normal noncontrasted appearance of the kidneys, ureters
and urinary bladder.  No urinary tract calculi or hydronephrosis.

The uterus appears mildly diffusely enlarged with no visible mass.
Unremarkable noncontrasted appearance of the ovaries, liver,
spleen, pancreas, gallbladder and adrenal glands.  No
gastrointestinal abnormalities or enlarged lymph nodes are seen.
No evidence of appendicitis.  Clear lung bases.  Mild lumbar and
lower thoracic spine degenerative changes.
IMPRESSION: No acute abnormality.

## 2012-12-25 MED ORDER — DIAZEPAM 5 MG PO TABS: 5.0000 mg | ORAL_TABLET | Freq: Two times a day (BID) | ORAL | Status: DC

## 2012-12-25 MED ORDER — DIAZEPAM 5 MG PO TABS
10.0000 mg | ORAL_TABLET | Freq: Once | ORAL | Status: AC
  Administered 2012-12-25: 10 mg via ORAL
  Filled 2012-12-25: qty 2

## 2012-12-25 MED ORDER — DIAZEPAM 5 MG PO TABS
5.0000 mg | ORAL_TABLET | Freq: Once | ORAL | Status: AC
  Administered 2012-12-25: 5 mg via ORAL
  Filled 2012-12-25: qty 1

## 2012-12-25 NOTE — Progress Notes (Signed)
Case manager met with patient at bedside,CM role explained.Case manager provided resource sheet for the [facility]/ PCP resources.Patient verbalized her  Understanding of  Case manager education.

## 2012-12-25 NOTE — ED Notes (Signed)
Heat pack applied to back-- sleeping but arouses easliy

## 2012-12-25 NOTE — ED Provider Notes (Signed)
Care assumed from [facility] [place] at shift change. Patient with back pain, possible kidney stone. Awaiting CT abd/pelvis without contrast, U/A. If no stone present, will be treated for muscle spasm, pain control, f/u with PCP. 6:07 PM CT scan unremarkable. Patient is laying on exam bed talking on the phone in no apparent distress. Pain relieved with Valium in the emergency department. She'll be discharged with a prescription for Valium, resources given for PCP followup. Return precautions discussed. Patient states understanding of plan and is agreeable.  Ct Abdomen Pelvis Wo Contrast  12/25/2012   *RADIOLOGY REPORT*  Clinical Data: Right flank pain and back pain.  Previous tubal ligation.  CT ABDOMEN AND PELVIS WITHOUT CONTRAST  Technique:  Multidetector CT imaging of the abdomen and pelvis was performed following the standard protocol without intravenous contrast.  Comparison: None.  Findings: Normal noncontrasted appearance of the kidneys, ureters and urinary bladder.  No urinary tract calculi or hydronephrosis.  The uterus appears mildly diffusely enlarged with no visible mass. Unremarkable noncontrasted appearance of the ovaries, liver, spleen, pancreas, gallbladder and adrenal glands.  No gastrointestinal abnormalities or enlarged lymph nodes are seen. No evidence of appendicitis.  Clear lung bases.  Mild lumbar and lower thoracic spine degenerative changes.  IMPRESSION: No acute abnormality.   Original Report Authenticated By: Beckie Salts, M.D.   Dg Chest 2 View  12/25/2012   *RADIOLOGY REPORT*  Clinical Data: Right-sided chest pain.  Associated shortness of breath/pleuritic chest pain.  CHEST - 2 VIEW  Comparison: None.  Findings: Lung volumes are relatively low.  There is no evidence of pulmonary infiltrate, edema or mass.  No pleural fluid is identified.  Heart size is at the upper limits of normal.  The visualized bony thorax is unremarkable.  IMPRESSION: Low lung volumes.  No active disease.    Original Report Authenticated By: Irish Lack, M.D.    Trevor Mace, PA-C 12/25/12 1807

## 2012-12-25 NOTE — ED Notes (Signed)
Also has hx of "bulging discs" in low back--

## 2012-12-25 NOTE — ED Notes (Signed)
Pt asking for as hot meal and pain med

## 2012-12-25 NOTE — ED Notes (Signed)
c-t has been called once  No  preg test resulted then.  Results for the past 45 minutes.

## 2012-12-25 NOTE — ED Notes (Signed)
The pt is on the telephone telling someone she has not had a hot meal only a cold sandwich.  Unable to take a temp she just had a drink of cold beverage.

## 2012-12-25 NOTE — ED Notes (Signed)
Pt states got up and bent over and developed upper back pain that radiated around to chest.  Pt states the pain has increased.  Pt reports sob and has had recent travel to [place] one week ago.  Pain with breathing on inspiration.

## 2012-12-25 NOTE — ED Notes (Signed)
States just moved here last week from [place] been under stress.

## 2012-12-25 NOTE — ED Notes (Signed)
The pt just returned from c-t med given

## 2012-12-25 NOTE — ED Provider Notes (Signed)
She developed chest pain, today, when bending. Pain is persistent, despite treatment in the emergency department. She denies dysuria, urinary frequency, fever, chills, nausea, vomiting. Recently, she traveled and has spent some time sleeping on the floor.  She denies any specific trauma.  Exam obese, alert, calm. Somewhat sedated from analgesics. Heart regular rate and rhythm. No murmur. Lungs clear to auscultation. Flank mild tenderness right medial. Abdomen nontender to palpation.  Assessment:  nonspecific pain. Doubt PE, pneumonia, ACS. Patient was required to have evaluation for renal stones. She most likely has musculoskeletal pain related to recent traveling and sleeping on the floor.    Medical screening examination/treatment/procedure(s) were conducted as a shared visit with non-physician practitioner(s) and myself.  I personally evaluated the patient during the encounter  Flint Melter, MD 12/25/12 [phone]
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
## 2012-12-25 NOTE — ED Notes (Signed)
The pt a[ppears comfortable   texting on her phone

## 2012-12-26 NOTE — ED Provider Notes (Signed)
Medical screening examination/treatment/procedure(s) were performed by non-physician practitioner and as supervising physician I was immediately available for consultation/collaboration.   Ananiah Maciolek H Eustacio Ellen, MD 12/26/12 0006 

## 2012-12-27 NOTE — ED Provider Notes (Signed)
Medical screening examination/treatment/procedure(s) were performed by non-physician practitioner and as supervising physician I was immediately available for consultation/collaboration.  Flint Melter, MD 12/27/12 [phone]

## 2013-03-01 ENCOUNTER — Encounter (HOSPITAL_COMMUNITY): Payer: Self-pay | Admitting: *Deleted

## 2013-03-01 ENCOUNTER — Emergency Department (HOSPITAL_COMMUNITY)
Admission: EM | Admit: 2013-03-01 | Discharge: 2013-03-02 | Disposition: A | Discharge status: Home / Self Care | Payer: Medicaid Other | Attending: Emergency Medicine | Admitting: Emergency Medicine

## 2013-03-01 DIAGNOSIS — E669 Obesity, unspecified: Secondary | ICD-10-CM | POA: Insufficient documentation

## 2013-03-01 DIAGNOSIS — M549 Dorsalgia, unspecified: Secondary | ICD-10-CM

## 2013-03-01 DIAGNOSIS — G8929 Other chronic pain: Secondary | ICD-10-CM | POA: Insufficient documentation

## 2013-03-01 DIAGNOSIS — F3289 Other specified depressive episodes: Secondary | ICD-10-CM | POA: Insufficient documentation

## 2013-03-01 DIAGNOSIS — F329 Major depressive disorder, single episode, unspecified: Secondary | ICD-10-CM | POA: Insufficient documentation

## 2013-03-01 DIAGNOSIS — F17200 Nicotine dependence, unspecified, uncomplicated: Secondary | ICD-10-CM | POA: Insufficient documentation

## 2013-03-01 DIAGNOSIS — M545 Low back pain, unspecified: Secondary | ICD-10-CM | POA: Insufficient documentation

## 2013-03-01 DIAGNOSIS — R45851 Suicidal ideations: Secondary | ICD-10-CM | POA: Insufficient documentation

## 2013-03-01 DIAGNOSIS — E119 Type 2 diabetes mellitus without complications: Secondary | ICD-10-CM | POA: Insufficient documentation

## 2013-03-01 DIAGNOSIS — Z91040 Latex allergy status: Secondary | ICD-10-CM | POA: Insufficient documentation

## 2013-03-01 LAB — ACETAMINOPHEN LEVEL: Acetaminophen (Tylenol), Serum: <15 ug/mL (ref 10–30)

## 2013-03-01 LAB — COMPREHENSIVE METABOLIC PANEL
ALT: 18 U/L (ref 0–35)
Alkaline Phosphatase: 76 U/L (ref 39–117)
BUN: 12 mg/dL (ref 6–23)
CO2: 28 mEq/L (ref 19–32)
GFR calc Af Amer: >90 mL/min (ref >90)
GFR calc non Af Amer: >90 mL/min (ref >90)
Glucose, Bld: 142 mg/dL — ABNORMAL HIGH (ref 70–99)
Potassium: 3.9 mEq/L (ref 3.5–5.1)
Sodium: 139 mEq/L (ref 135–145)
Total Bilirubin: 0.2 mg/dL — ABNORMAL LOW (ref 0.3–1.2)
Total Protein: 7.4 g/dL (ref 6.0–8.3)

## 2013-03-01 LAB — CBC
HCT: 33.9 % — ABNORMAL LOW (ref 36.0–46.0)
Hemoglobin: 11 g/dL — ABNORMAL LOW (ref 12.0–15.0)
MCHC: 32.4 g/dL (ref 30.0–36.0)
RBC: 4.05 MIL/uL (ref 3.87–5.11)

## 2013-03-01 LAB — ETHANOL: Alcohol, Ethyl (B): <11 mg/dL (ref 0–11)

## 2013-03-01 LAB — SALICYLATE LEVEL: Salicylate Lvl: <2 mg/dL — ABNORMAL LOW (ref 2.8–20.0)

## 2013-03-01 MED ORDER — LORAZEPAM 2 MG/ML IJ SOLN: 2.0000 mg | Freq: Once | INTRAMUSCULAR | Status: DC

## 2013-03-01 MED ORDER — NICOTINE 21 MG/24HR TD PT24
21.0000 mg | MEDICATED_PATCH | Freq: Every day | TRANSDERMAL | Status: DC
  Filled 2013-03-01: qty 1

## 2013-03-01 MED ORDER — IBUPROFEN 400 MG PO TABS: 600.0000 mg | ORAL_TABLET | Freq: Three times a day (TID) | ORAL | Status: DC | PRN

## 2013-03-01 MED ORDER — ONDANSETRON HCL 4 MG PO TABS: 4.0000 mg | ORAL_TABLET | Freq: Three times a day (TID) | ORAL | Status: DC | PRN

## 2013-03-01 MED ORDER — IBUPROFEN 800 MG PO TABS
800.0000 mg | ORAL_TABLET | Freq: Once | ORAL | Status: AC
  Administered 2013-03-01: 800 mg via ORAL
  Filled 2013-03-01: qty 1

## 2013-03-01 MED ORDER — OXYCODONE-ACETAMINOPHEN 5-325 MG PO TABS
1.0000 | ORAL_TABLET | Freq: Once | ORAL | Status: AC
  Administered 2013-03-01: 1 via ORAL
  Filled 2013-03-01: qty 1

## 2013-03-01 MED ORDER — LORAZEPAM 2 MG/ML IJ SOLN: 2.0000 mg | Freq: Once | INTRAMUSCULAR | Status: AC | PRN

## 2013-03-01 MED ORDER — ALUM & MAG HYDROXIDE-SIMETH 200-200-20 MG/5ML PO SUSP: 30.0000 mL | ORAL | Status: DC | PRN

## 2013-03-01 NOTE — ED Notes (Signed)
Team member from [facility] called to make [place] aware of time change for tele psych. Time had now been changed to midnight.

## 2013-03-01 NOTE — ED Provider Notes (Signed)
CSN: [id number]     Arrival date & time 03/01/13  1954 History   None    Chief Complaint  Patient presents with  . Medical Clearance  . Back Pain   (Consider location/radiation/quality/duration/timing/severity/associated sxs/prior Treatment) HPI History provided by pt.   Pt presents voluntarily for SI w/ plan to OD.  Has h/o depression and is non-compliant w/ her seroquel.  Reports past suicide attempts by overdose w/ seroquel, her aunt who she lives with now controls her medications, and she "misplaced them".  Has been feeling very depressed recently.  Doesn't want to get out of bed in the morning.  Denies HI and drug/alcohol abuse.  Also c/o non-traumatic, acute on chronic low back pain w/ radiation down both legs since this morning.  No associated fever, LE weakness/paresthesias or bowel/bladder dysfunction.  Has taken hydrocodone for this pain in the past w/out relief and it makes her itch.   Past Medical History  Diagnosis Date  . 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 Cesarean section     History reviewed. No pertinent family history. History  Substance Use Topics  . Smoking status: Current Every Day Smoker  . Smokeless tobacco: Not on file  . Alcohol Use: No   OB History   Grav Para Term Preterm Abortions TAB SAB Ect Mult Living                 Review of Systems  All other systems reviewed and are negative.    Allergies  Latex  Home Medications   Current Outpatient Rx  Name  Route  Sig  Dispense  Refill  . QUEtiapine (SEROQUEL XR) 300 MG 24 hr tablet   Oral   Take 300 mg by mouth at bedtime.         . diazepam (VALIUM) 5 MG tablet   Oral   Take 1 tablet (5 mg total) by mouth 2 (two) times daily.   10 tablet   0    BP 132/92  Pulse 72  Temp(Src) 98.4 F (36.9 C) (Oral)  Resp 16  SpO2 98% Physical Exam  Nursing note and vitals reviewed. Constitutional: She is oriented to person,  place, and time. She appears well-developed and well-nourished.  obese  HENT:  Head: Normocephalic and atraumatic.  Eyes:  Normal appearance  Neck: Normal range of motion.  Cardiovascular: Normal rate and regular rhythm.   Pulmonary/Chest: Effort normal and breath sounds normal.  Genitourinary:  No CVA ttp  Musculoskeletal:  Lumbar spinal and paraspinal ttp. Full active ROM of LE.  Diminished but equal patellar reflexes.  No saddle anesthesia. Distal sensation intact.  2+ DP pulses.  Ambulates w/out diffulty.   Neurological: She is alert and oriented to person, place, and time.  Skin: Skin is warm and dry. No rash noted.  Psychiatric: She has a normal mood and affect. Her behavior is normal.  Depressed mood     ED Course  Procedures (including critical care time) Labs Review Labs Reviewed  CBC - Abnormal; Notable for the following:    Hemoglobin 11.0 (*)    HCT 33.9 (*)    All other components within normal limits  COMPREHENSIVE METABOLIC PANEL - Abnormal; Notable for the following:    Glucose, Bld 142 (*)    Total Bilirubin 0.2 (*)    All other components within normal limits  SALICYLATE LEVEL - Abnormal; Notable for the following:  Salicylate Lvl <2.0 (*)    All other components within normal limits  ACETAMINOPHEN LEVEL  ETHANOL  URINE RAPID DRUG SCREEN (HOSP PERFORMED)   Imaging Review No results found.  MDM   1. Suicidal ideation   2. Back pain    37yo F presents w/ depression and SI w/ plan.  Has been non-compliant w/ seroquel because Aunt who controls her meds misplaced them.  I do feel that this patient is a danger to herself and even though she came voluntarily, she should not be allowed to leave at this time.  Also c/o acute on chronic, non-traumatic low back pain.  No lumbar spinal tenderness and no NV deficits of extremities.  She is ambulatory.  Will treat symptomatically w/ percocet.  Labs pending to medically clear.  Holding orders written.  9:20 PM    Pt stated that she wanted to leave the ER because she has children at home, she's not really suicidal, she must have been under the influence of seroquel when I talked to her (she told me on initial encounter that she has not been taking this medication) and that all she wanted to was pain medicine for her back.  She is being argumentative and combative.  I still feel strongly that patient is a danger to herself and she has two small children at home.  Involuntary Commitment papers signed by Dr. Blinda Leatherwood.  11:16 PM   Spoke with ACT team.  Berna Spare reports that patient is calm and cooperative and has stated that she is not a harm to herself and she would like to be discharged home.  I requested that the psych NP see her first.  6:27 AM     Otilio Miu, PA-C 03/02/13 [phone]

## 2013-03-01 NOTE — ED Notes (Addendum)
Pt stated she was unable to walk from room E47 to C20 due to severe back pain.  Pt stated she has been having pain in her back "for a minute."  Pt could not explain how long she had been having pain.  Pt stated she did not see a physician for the pain in her back.  Pt initially falling asleep intermittently throughout interview.  Pt admitted to taking her Seroquel during the day even though she is only supposed to take them at night. Pt stated she took 2 tabs, 300 mg each around 4pm. Pt stated her intent was to sleep through the pain. Pt states she has two small children.  Security entered the room to wand the pt.  Pt refused to stand up to be wanded, stating that she was in too much pain to stand. Pt stated she wanted to leave.  Pt informed that she could not leave in the condition she was in, having taken Seroquel to "help her sleep through the pain."  At that time, pt became angry, and stated she needed to call her aunt. Pt informed that she could use the phone at the nurses' station.  Pt then jumped out of bed and walked quickly to the nurses' station. Pt began yelling at the staff members that were at the nurses' station. Pt also yelled at [employer] and [employer] officers. Pt ambulating independently back and forth in front of the nurses' station.

## 2013-03-01 NOTE — ED Notes (Signed)
Security called to wand patient. [name], RN placing pt in paper scrubs at this time.

## 2013-03-01 NOTE — ED Notes (Signed)
Pt in c/o lower back pain and depression, states she has had thoughts of taking a hand full of pills today to kill herself, states she is depressed because her aunt yells at her all the time, states she has had issues with the lower back pain in the past but it has increased lately, pt denies substance abuse

## 2013-03-02 DIAGNOSIS — F3130 Bipolar disorder, current episode depressed, mild or moderate severity, unspecified: Secondary | ICD-10-CM

## 2013-03-02 NOTE — ED Provider Notes (Signed)
Patient examined in ED.  Patient was becoming agitated as she wanted to go home and take care of her children. She denies SI or HI, she was frustrated with her chronic lower back pain and wanted pills to help the pain. Discussed plan for psychiatry assessment. Spoke with psychiatrist post tele-psych interview, patient is clear to go home at this time.  Follow up discussed. Return reasons discussed.   Blane Ohara M 9:02 AM   Enid Skeens, MD 03/02/13 [phone]

## 2013-03-02 NOTE — ED Notes (Signed)
Kaitlyn [name] from [facility] called and I advised that the patient was on best behavior when she did telepsych with Dr. Christell Constant.  This behavior is not consistent with the behavior exhibited by patient throughout the morning.

## 2013-03-02 NOTE — ED Notes (Signed)
Housekeeping at bedside cleaning up urine from where pt urination onto floor.

## 2013-03-02 NOTE — ED Notes (Signed)
Patient asked to place blue scrubs on. Pt refused and began to yell that she wanted to go home. Pt told that she could not go home right now and updated on plan of care. Pt still refusing to place blue scrubs on.
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
## 2013-03-02 NOTE — ED Notes (Addendum)
Pt flat affect upon discharge and unpleasant to staff, pt reporting "I don't know why I should sign anything for you."

## 2013-03-02 NOTE — ED Notes (Signed)
Patient currently speaking with a NP over tele psych.

## 2013-03-02 NOTE — ED Notes (Signed)
Dr. Christell Constant called in to telepsych patient.  Patient calm and cooperative during telepsych session.

## 2013-03-02 NOTE — ED Notes (Signed)
All of belongings provided back to pt and pt reported all of belongings were in the bag.

## 2013-03-02 NOTE — BH Assessment (Signed)
Spoke w/ Dr. Christell Constant who sts she will call EDP Zavitz to discuss pt. Dr. Christell Constant will be doing telepsych. Writer called Amy RN to give her update and Clinical research associate will call Amy RN back when Clinical research associate has info re: time to schedule telepsychs.   Evette Cristal, [place]  Assessment Counselor

## 2013-03-02 NOTE — ED Notes (Addendum)
Patient was asked to get into scrubs.  Patient very irate and verbally abusive to staff.  Patient advised that she needed to get into scrubs and we needed to put her belongings up.   Patient replied repeatedly "It's because I'm black".  [employer] and security at bedside to be on standby.   They asked patient if she would please get into scrubs and patient started screaming and scratching her arms.   She then started taking clothes off, throwing the clothes in the floor.  Belongings were collected and inventoried.   Patient squatted in floor and urinated on the floor.  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  Patient was asked to clean up mess, and she refused.   Dr. Jodi Mourning was called to come and speak with patient.   He came and spoke with the patient.   Lequita Halt, Consulting civil engineer, was also notified and came to POD C for assistance.   Patient was not restrained.  Patient did calm down after numerous pleas for her to do so.
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
## 2013-03-04 NOTE — ED Provider Notes (Signed)
Medical screening examination/treatment/procedure(s) were performed by non-physician practitioner and as supervising physician I was immediately available for consultation/collaboration.    Gilda Crease, MD 03/04/13 [phone]

## 2013-03-21 ENCOUNTER — Encounter (HOSPITAL_COMMUNITY): Payer: Self-pay | Admitting: Adult Health

## 2013-03-21 ENCOUNTER — Emergency Department (HOSPITAL_COMMUNITY)
Admission: EM | Admit: 2013-03-21 | Discharge: 2013-03-23 | Disposition: A | Discharge status: Other Acute Inpatient Hospital | Payer: Medicaid Other | Attending: Emergency Medicine | Admitting: Emergency Medicine

## 2013-03-21 DIAGNOSIS — E119 Type 2 diabetes mellitus without complications: Secondary | ICD-10-CM | POA: Insufficient documentation

## 2013-03-21 DIAGNOSIS — T43591A Poisoning by other antipsychotics and neuroleptics, accidental (unintentional), initial encounter: Secondary | ICD-10-CM | POA: Insufficient documentation

## 2013-03-21 DIAGNOSIS — F17200 Nicotine dependence, unspecified, uncomplicated: Secondary | ICD-10-CM | POA: Insufficient documentation

## 2013-03-21 DIAGNOSIS — T43501A Poisoning by unspecified antipsychotics and neuroleptics, accidental (unintentional), initial encounter: Secondary | ICD-10-CM | POA: Insufficient documentation

## 2013-03-21 DIAGNOSIS — Z3202 Encounter for pregnancy test, result negative: Secondary | ICD-10-CM | POA: Insufficient documentation

## 2013-03-21 DIAGNOSIS — IMO0002 Reserved for concepts with insufficient information to code with codable children: Secondary | ICD-10-CM | POA: Insufficient documentation

## 2013-03-21 DIAGNOSIS — Y929 Unspecified place or not applicable: Secondary | ICD-10-CM | POA: Insufficient documentation

## 2013-03-21 DIAGNOSIS — Z91040 Latex allergy status: Secondary | ICD-10-CM | POA: Insufficient documentation

## 2013-03-21 DIAGNOSIS — R45851 Suicidal ideations: Secondary | ICD-10-CM

## 2013-03-21 DIAGNOSIS — E669 Obesity, unspecified: Secondary | ICD-10-CM | POA: Insufficient documentation

## 2013-03-21 DIAGNOSIS — Y9389 Activity, other specified: Secondary | ICD-10-CM | POA: Insufficient documentation

## 2013-03-21 LAB — COMPREHENSIVE METABOLIC PANEL
AST: 28 U/L (ref 0–37)
Albumin: 3.3 g/dL — ABNORMAL LOW (ref 3.5–5.2)
Alkaline Phosphatase: 58 U/L (ref 39–117)
Calcium: 8.4 mg/dL (ref 8.4–10.5)
Creatinine, Ser: 0.83 mg/dL (ref 0.50–1.10)
GFR calc non Af Amer: 88 mL/min — ABNORMAL LOW (ref >90)
Total Protein: 7 g/dL (ref 6.0–8.3)

## 2013-03-21 LAB — RAPID URINE DRUG SCREEN, HOSP PERFORMED
Amphetamines: NOT DETECTED
Barbiturates: NOT DETECTED
Benzodiazepines: NOT DETECTED
Cocaine: NOT DETECTED

## 2013-03-21 LAB — CBC
MCH: 28.1 pg (ref 26.0–34.0)
MCHC: 33.3 g/dL (ref 30.0–36.0)
MCV: 84.2 fL (ref 78.0–100.0)
Platelets: 247 10*3/uL (ref 150–400)
RDW: 14.9 % (ref 11.5–15.5)

## 2013-03-21 LAB — GLUCOSE, CAPILLARY: Glucose-Capillary: 186 mg/dL — ABNORMAL HIGH (ref 70–99)

## 2013-03-21 LAB — ACETAMINOPHEN LEVEL: Acetaminophen (Tylenol), Serum: <15 ug/mL (ref 10–30)

## 2013-03-21 LAB — SALICYLATE LEVEL: Salicylate Lvl: <2 mg/dL — ABNORMAL LOW (ref 2.8–20.0)

## 2013-03-21 MED ORDER — SODIUM CHLORIDE 0.9 % IV BOLUS (SEPSIS)
1000.0000 mL | Freq: Once | INTRAVENOUS | Status: AC
  Administered 2013-03-21: 1000 mL via INTRAVENOUS

## 2013-03-21 MED ORDER — LORAZEPAM 1 MG PO TABS: 1.0000 mg | ORAL_TABLET | Freq: Three times a day (TID) | ORAL | Status: DC | PRN

## 2013-03-21 NOTE — Progress Notes (Signed)
ED CM received consult from [facility]. Pt  Presents to ED  with seroquel drug ingestion, ingested after argument with aunt. In to meet with patient. Pt very somnolent. Unable to speak with patient her eyes closed after every word. Let crisis  Resource material with Janelle RN on Pod C to give to patient when fully awake. No further CM needs identified

## 2013-03-21 NOTE — ED Notes (Addendum)
Presents with seroquel drug ingestion, ingestedafter argument with aunt. Pt states, "I just want to go to sleep. I can't take it. I took a handful of pills, seroquel because I just want to sleep. When I am awake I just want to cry. I don't want to be here. I didn't want to come here. I took the pills earlier but I don't when" pt has flat affect, drowsy, alert.

## 2013-03-21 NOTE — ED Notes (Signed)
Spoke with Case management and Social work about pt and living situation. Pt denies thoughts of harming self and denies suicidal ideation. Reports that she just wanted to fall asleep and get away from the stress for a little while.

## 2013-03-21 NOTE — ED Notes (Signed)
Patient is resting comfortably. 

## 2013-03-21 NOTE — ED Provider Notes (Signed)
CSN: [id number]     Arrival date & time 03/21/13  1648 History   First MD Initiated Contact with Patient 03/21/13 1648     Chief Complaint  Patient presents with  . Ingestion  . Medical Clearance   (Consider location/radiation/quality/duration/timing/severity/associated sxs/prior Treatment) Patient is a 37 y.o. female presenting with Ingested Medication. The history is provided by the patient and the EMS personnel.  Ingestion This is a new problem. The current episode started today. The problem occurs constantly. The problem has been unchanged. Pertinent negatives include no abdominal pain, anorexia, arthralgias, change in bowel habit, chest pain, chills, congestion, coughing, fatigue, fever, headaches, myalgias, nausea, numbness, rash, sore throat, swollen glands, visual change, vomiting or weakness. Nothing aggravates the symptoms. She has tried nothing for the symptoms. The treatment provided no relief.    Past Medical History  Diagnosis Date  . 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 Cesarean section     History reviewed. No pertinent family history. History  Substance Use Topics  . Smoking status: Current Every Day Smoker  . Smokeless tobacco: Not on file  . Alcohol Use: No   OB History   Grav Para Term Preterm Abortions TAB SAB Ect Mult Living                 Review of Systems  Constitutional: Negative for fever, chills, activity change and fatigue.  HENT: Negative for congestion and sore throat.   Respiratory: Negative for cough and chest tightness.   Cardiovascular: Negative for chest pain.  Gastrointestinal: Negative for nausea, vomiting, abdominal pain, diarrhea, constipation, anorexia and change in bowel habit.  Genitourinary: Negative for dysuria and difficulty urinating.  Musculoskeletal: Negative for myalgias and arthralgias.  Skin: Negative for color change, pallor, rash and wound.   Neurological: Negative for dizziness, tremors, facial asymmetry, speech difficulty, weakness, numbness and headaches.  Psychiatric/Behavioral: Positive for agitation. Negative for suicidal ideas, hallucinations, confusion, self-injury and decreased concentration. The patient is not hyperactive.   All other systems reviewed and are negative.    Allergies  Latex  Home Medications   Current Outpatient Rx  Name  Route  Sig  Dispense  Refill  . QUEtiapine (SEROQUEL XR) 300 MG 24 hr tablet   Oral   Take 300 mg by mouth at bedtime.          BP 112/61  Pulse 73  Temp(Src) 98.2 F (36.8 C) (Oral)  Resp 16  SpO2 97%  LMP 03/21/2013 Physical Exam  Nursing note and vitals reviewed. Constitutional: She is oriented to person, place, and time. She appears well-developed and well-nourished. No distress.  Drowsy, obese female who arouses to questioning and responds appropriately.  HENT:  Head: Normocephalic and atraumatic.  Mouth/Throat: Oropharynx is clear and moist. No oropharyngeal exudate.  Droopy eyelids.  Eyes: Conjunctivae and EOM are normal. Pupils are equal, round, and reactive to light.  Neck: Normal range of motion. Neck supple.  Cardiovascular: Normal rate, regular rhythm and normal heart sounds.  Exam reveals no gallop and no friction rub.   No murmur heard. Pulmonary/Chest: Effort normal and breath sounds normal. No respiratory distress. She has no wheezes. She has no rales. She exhibits no tenderness.  Abdominal: Soft. She exhibits no distension. There is no tenderness.  Musculoskeletal: Normal range of motion. She exhibits no edema and no tenderness.  Lymphadenopathy:    She has no cervical adenopathy.  Neurological: She is alert and  oriented to person, place, and time.  Skin: Skin is warm and dry. No rash noted. She is not diaphoretic.  Psychiatric: She has a normal mood and affect. Judgment and thought content normal. Her speech is delayed and slurred. She is slowed.  She expresses no homicidal and no suicidal ideation. She expresses no suicidal plans and no homicidal plans.  Denies SI, HI, intent to harm.     ED Course  Procedures (including critical care time) Labs Review Labs Reviewed  CBC - Abnormal; Notable for the following:    Hemoglobin 11.2 (*)    HCT 33.6 (*)    All other components within normal limits  COMPREHENSIVE METABOLIC PANEL - Abnormal; Notable for the following:    Glucose, Bld 192 (*)    Albumin 3.3 (*)    Total Bilirubin 0.1 (*)    GFR calc non Af Amer 88 (*)    All other components within normal limits  SALICYLATE LEVEL - Abnormal; Notable for the following:    Salicylate Lvl <2.0 (*)    All other components within normal limits  URINE RAPID DRUG SCREEN (HOSP PERFORMED) - Abnormal; Notable for the following:    Tetrahydrocannabinol POSITIVE (*)    All other components within normal limits  GLUCOSE, CAPILLARY - Abnormal; Notable for the following:    Glucose-Capillary 186 (*)    All other components within normal limits  ETHANOL  ACETAMINOPHEN LEVEL  POCT PREGNANCY, URINE   Imaging Review No results found.  MDM   1. Drug ingestion, initial encounter     The patient is a 37 year old female with a history of bipolar disorder who presents with ingestion of 2 Seroquel as well as drowsiness. She states that she was in an argument with her and at which point she took 2 Seroquel because she "wanted to go to sleep". She denies intention to harm, suicidality, homicidality. She states that when she gets agitated, in the past she has had episodes where she would hurt herself, however she did not want to get to that state today, so she took the pills to go to sleep. She denies any coingestions including alcohol, illicit drugs, prescription drugs.  On arrival patient is afebrile and hemodynamically stable. She is obviously drowsy arouses and is appropriate in her responses. States not suicidal but states she just wants to sleep  and get away from everything. Will obtain psych labs, overdose labs. Will allowed to metabolize and reexamined.   Date: 03/21/2013  Rate: 72  Rhythm: normal sinus rhythm  QRS Axis: normal  Intervals: normal  ST/T Wave abnormalities: nonspecific T wave changes  Conduction Disutrbances:none  Narrative Interpretation:   Old EKG Reviewed: diffuse t-wave flattening new from previous   Patient remains drowsy and minimally arroused following more than five hours of observation. Labs show marijuana use but no other significant abnormality. Still unable to ambulate. Intent of overdose and means of overdose remain unclear. Story is remarkably similar to previous stay in behavioral health last month when she overdosed. Will consult psychiatry for evaluation based on these concerns.  Patient was discussed with my attending, Dr. Jeraldine Loots.  Dorna Leitz, MD 03/21/13 2330

## 2013-03-22 ENCOUNTER — Encounter (HOSPITAL_COMMUNITY): Payer: Self-pay | Admitting: Licensed Clinical Social Worker

## 2013-03-22 DIAGNOSIS — T43501A Poisoning by unspecified antipsychotics and neuroleptics, accidental (unintentional), initial encounter: Secondary | ICD-10-CM

## 2013-03-22 DIAGNOSIS — T43502A Poisoning by unspecified antipsychotics and neuroleptics, intentional self-harm, initial encounter: Secondary | ICD-10-CM

## 2013-03-22 DIAGNOSIS — F332 Major depressive disorder, recurrent severe without psychotic features: Secondary | ICD-10-CM

## 2013-03-22 DIAGNOSIS — F1910 Other psychoactive substance abuse, uncomplicated: Secondary | ICD-10-CM

## 2013-03-22 MED ORDER — IBUPROFEN 400 MG PO TABS
600.0000 mg | ORAL_TABLET | Freq: Once | ORAL | Status: AC
  Administered 2013-03-22: 16:00:00 600 mg via ORAL
  Filled 2013-03-22 (×2): qty 1

## 2013-03-22 MED ORDER — ZIPRASIDONE MESYLATE 20 MG IM SOLR
20.0000 mg | Freq: Once | INTRAMUSCULAR | Status: AC
  Administered 2013-03-22: 20 mg via INTRAMUSCULAR
  Filled 2013-03-22: qty 20

## 2013-03-22 MED ORDER — STERILE WATER FOR INJECTION IJ SOLN
INTRAMUSCULAR | Status: AC
  Administered 2013-03-22: 10 mL
  Filled 2013-03-22: qty 10

## 2013-03-22 NOTE — ED Provider Notes (Signed)
Assuming care of patient this AM.  Patient in the ED for SI. Awaiting psych reassessment. Possible d/c. Workup thus far is negative. Patient had no complains, no concerns from the nursing side.  Will continue to monitor.  Filed Vitals:   03/22/13 1128  BP: 150/105  Pulse: 68  Temp: 98.7 F (37.1 C)  Resp: 16     Kaitlyn Bekker, MD 03/22/13 1344

## 2013-03-22 NOTE — ED Notes (Signed)
Pt sleeping; resps e/u; arousable.  Still unable to ambulate.

## 2013-03-22 NOTE — BHH Counselor (Signed)
Writer ran patient by Dr. Janann Colonel. This writer recommended patient to be discharged based on the TA. Patient denies SI, HI, and AVH's.  Per his Dr. Brandy Hale request, this patient will need to be evaluated by a physician or PA/NP. EDP also requesting a note in EPIC by psychiatry or PA/NP (with physician signing off on PA/NP's note).  Writer has attempted to notify the the on call NP-Laura Earlene Plater but she is currently seeing patients on the unit. Writer has therefore notified Fransisca Kaufmann via typed note (left on her work area) and left a message for her with the nursing secretary/tech Tiara asking that she see's this patient.   Patient will continue to remain in the ED until psychiatry or midlevel makes further recommendations.

## 2013-03-22 NOTE — ED Notes (Signed)
Pt standing at nurses' desk calling her aunt. Sitter and [employer] w/pt.

## 2013-03-22 NOTE — Progress Notes (Signed)
Met patient at Bedside. Role of Case Manager explained.Patient verbalizes her understanding .Patient resides with her Aunt and two small children. This Clinical research associate asked the patient if she had a PCP  And patient reports she did not.Education provided on the importance of a PCP and having follow up PCP Visit.Patient provided with a Medicaid packet and education provided on PCP providers. This Clinical research associate asked the patient if she had any needs / questions.Patient states if would be nice to have a nurse check oh her at home.Spoke with patient about services and this Clinical research associate with the Consent of the patient I placed a call to [employer] RE-Patients request.Patient has MEDICAID insurance, and no skilled needs at this time so would not qualify for [facility] home health. This Writer spoke with Becky Patients RN she has liaised with the ACT team re this patient.Patient provided with a [address]- Card and was provided with education on the [facility] packet.
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
## 2013-03-22 NOTE — Progress Notes (Signed)
B.Tamaj Jurgens, MHT completed placement search for patient by contacting the following facilities listed below;  [facility] per [employer] fax for review [facility] per [place] at capacity [facility] per [place] fax for review [facility] per [place] can fax for review [facility] per [place] at [employer] no response [facility] [employer] per [place] at [employer] per [place] can fax for review [facility] per [employer] for review [facility] per [place] E[facility] at capacity [facility] no response

## 2013-03-22 NOTE — ED Provider Notes (Signed)
This patient was seen in conjunction with the Resident Physician, Dr. Durward Fortes.  I have seen the relevant studies (and ECG as performed) and agree with the interpretation.  The documentation is accurate, and reflects my interpretation of the encounter with the following addition (s).  This patient presents approximately 2 weeks after similar presentation.  Notably, the patient was somnolent throughout her emergency department course, though she was hemodynamically stable.  With the patient's inconsistent story, prior attempts at suicide, history of depression, she was medically clear for behavioral health evaluation, when more awake  Gerhard Munch, MD 03/22/13 [phone]
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
## 2013-03-22 NOTE — ED Notes (Signed)
Breakfast tray ordered 

## 2013-03-22 NOTE — ED Provider Notes (Addendum)
Seen with NP - d/w Dr. Lucianne Muss - they want to admit the pt to the psychiatric ward due to ongoing suicidal plans.  Transfer orders completed.  Vida Roller, MD 03/22/13 [id number]  Vida Roller, MD 03/22/13 1754

## 2013-03-22 NOTE — Progress Notes (Signed)
Kaitlyn Gray, MHT received report from [place], RN that patients behaviors were escalating and non compliance with directives as patient continues to shout uncontrollably. Kriste Basque, RN requested assistance with IVC process from Clinical research associate.  Dr. Hyacinth Meeker supports IVC which was completed. Writer was requested by Jessie Foot, TTS counselor to offer patient resource for ACTT provider. Writer obtained signed consent to release form from patient. Patient will be referred to [facility] for ACTT services after receiving recommended in patient treatment.

## 2013-03-23 ENCOUNTER — Inpatient Hospital Stay (HOSPITAL_COMMUNITY)
Admission: AD | Admit: 2013-03-23 | Discharge: 2013-03-25 | DRG: 885 | Disposition: A | Discharge status: Home / Self Care | Payer: Medicaid Other | Source: Intra-hospital | Attending: Psychiatry | Admitting: Psychiatry

## 2013-03-23 ENCOUNTER — Encounter (HOSPITAL_COMMUNITY): Payer: Self-pay | Admitting: *Deleted

## 2013-03-23 DIAGNOSIS — Z79899 Other long term (current) drug therapy: Secondary | ICD-10-CM

## 2013-03-23 DIAGNOSIS — F39 Unspecified mood [affective] disorder: Secondary | ICD-10-CM

## 2013-03-23 DIAGNOSIS — F1210 Cannabis abuse, uncomplicated: Secondary | ICD-10-CM

## 2013-03-23 DIAGNOSIS — E119 Type 2 diabetes mellitus without complications: Secondary | ICD-10-CM | POA: Diagnosis present

## 2013-03-23 DIAGNOSIS — F332 Major depressive disorder, recurrent severe without psychotic features: Principal | ICD-10-CM

## 2013-03-23 MED ORDER — FLUOXETINE HCL 20 MG PO CAPS
20.0000 mg | ORAL_CAPSULE | Freq: Every day | ORAL | Status: DC
  Administered 2013-03-24 – 2013-03-25 (×2): 20 mg via ORAL
  Filled 2013-03-23: qty 1
  Filled 2013-03-23: qty 3
  Filled 2013-03-23 (×3): qty 1

## 2013-03-23 MED ORDER — OLANZAPINE 10 MG PO TBDP
10.0000 mg | ORAL_TABLET | Freq: Three times a day (TID) | ORAL | Status: DC | PRN
  Administered 2013-03-24: 10 mg via ORAL
  Filled 2013-03-23: qty 1

## 2013-03-23 MED ORDER — ZIPRASIDONE HCL 20 MG PO CAPS
ORAL_CAPSULE | ORAL | Status: AC
  Administered 2013-03-23: 20 mg via ORAL
  Filled 2013-03-23: qty 1

## 2013-03-23 MED ORDER — ACETAMINOPHEN 325 MG PO TABS
650.0000 mg | ORAL_TABLET | Freq: Four times a day (QID) | ORAL | Status: DC | PRN
  Administered 2013-03-23 – 2013-03-24 (×2): 650 mg via ORAL

## 2013-03-23 MED ORDER — DIPHENHYDRAMINE HCL 50 MG/ML IJ SOLN
25.0000 mg | Freq: Once | INTRAMUSCULAR | Status: DC
  Filled 2013-03-23: qty 0.5

## 2013-03-23 MED ORDER — DIPHENHYDRAMINE HCL 50 MG PO CAPS
50.0000 mg | ORAL_CAPSULE | Freq: Once | ORAL | Status: AC
  Administered 2013-03-23: 50 mg via ORAL

## 2013-03-23 MED ORDER — DIPHENHYDRAMINE HCL 25 MG PO CAPS
ORAL_CAPSULE | ORAL | Status: AC
  Administered 2013-03-23: 50 mg via ORAL
  Filled 2013-03-23: qty 2

## 2013-03-23 MED ORDER — DIPHENHYDRAMINE HCL 50 MG/ML IJ SOLN
INTRAMUSCULAR | Status: AC
  Filled 2013-03-23: qty 1

## 2013-03-23 MED ORDER — TRAZODONE HCL 100 MG PO TABS
100.0000 mg | ORAL_TABLET | Freq: Every evening | ORAL | Status: DC | PRN
  Administered 2013-03-23 – 2013-03-24 (×3): 100 mg via ORAL
  Filled 2013-03-23: qty 1
  Filled 2013-03-23: qty 6
  Filled 2013-03-23 (×2): qty 1

## 2013-03-23 MED ORDER — ZIPRASIDONE MESYLATE 20 MG IM SOLR
INTRAMUSCULAR | Status: AC
  Filled 2013-03-23: qty 20

## 2013-03-23 MED ORDER — ZIPRASIDONE HCL 20 MG PO CAPS
20.0000 mg | ORAL_CAPSULE | Freq: Once | ORAL | Status: AC
  Administered 2013-03-23: 20 mg via ORAL

## 2013-03-23 MED ORDER — MAGNESIUM HYDROXIDE 400 MG/5ML PO SUSP: 30.0000 mL | Freq: Every day | ORAL | Status: DC | PRN

## 2013-03-23 MED ORDER — ZIPRASIDONE MESYLATE 20 MG IM SOLR
20.0000 mg | Freq: Once | INTRAMUSCULAR | Status: DC
  Filled 2013-03-23: qty 20

## 2013-03-23 MED ORDER — ALUM & MAG HYDROXIDE-SIMETH 200-200-20 MG/5ML PO SUSP
30.0000 mL | ORAL | Status: DC | PRN
  Administered 2013-03-25: 30 mL via ORAL

## 2013-03-23 MED ORDER — TRAZODONE HCL 50 MG PO TABS: 50.0000 mg | ORAL_TABLET | Freq: Every evening | ORAL | Status: DC | PRN

## 2013-03-23 NOTE — ED Provider Notes (Signed)
Patient accepted for inpatient admission at [facility] for ongoing suicidal plans. Accepting physician Dr. Jannifer Franklin. EMTALA completed. Patient hemodynamically stable and appropriate for transfer.  Antony Madura, [place] 03/23/13 [phone]

## 2013-03-23 NOTE — ED Notes (Signed)
PT cooperative with [facility] for transport to [facility].

## 2013-03-23 NOTE — [facility] Suicide Risk Assessment (Signed)
Suicide Risk Assessment  Admission Assessment     Nursing information obtained from:    Demographic factors:    Current Mental Status:    Loss Factors:    Historical Factors:    Risk Reduction Factors:     CLINICAL FACTORS:   Severe Anxiety and/or Agitation Depression:   Anhedonia Comorbid alcohol abuse/dependence Hopelessness Impulsivity Insomnia Alcohol/Substance Abuse/Dependencies  COGNITIVE FEATURES THAT CONTRIBUTE TO RISK:  Closed-mindedness Polarized thinking    SUICIDE RISK:   Mild:  Suicidal ideation of limited frequency, intensity, duration, and specificity.  There are no identifiable plans, no associated intent, mild dysphoria and related symptoms, good self-control (both objective and subjective assessment), few other risk factors, and identifiable protective factors, including available and accessible social support.  PLAN OF CARE:1. Admit for crisis management and stabilization. 2. Medication management to reduce current symptoms to base line and improve the     patient's overall level of functioning 3. Treat health problems as indicated. 4. Develop treatment plan to decrease risk of relapse upon discharge and the need for     readmission. 5. Psycho-social education regarding relapse prevention and self care. 6. Health care follow up as needed for medical problems. 7. Restart home medications where appropriate.   I certify that inpatient services furnished can reasonably be expected to improve the patient's condition.  Trina Asch,MD 03/23/2013, 11:06 AM

## 2013-03-23 NOTE — ED Notes (Signed)
Pt ambulatory to [facility] ,tol well. Sitter with PT.

## 2013-03-23 NOTE — ED Notes (Signed)
PT agitated and crying while walking in hall . Pt redirected to her room and security notified for Pt safety.. Pt crying and stating she wants to go home. Pt reports wanting to see her child .

## 2013-03-23 NOTE — [facility] Group Notes (Signed)
[facility] Group Notes:  (Counselor/Nursing/MHT/Case Management/Adjunct)  03/23/2013 1:15PM  Type of Therapy:  Group Therapy  Participation Level:  Did not attend    Summary of Progress/Problems: The topic for group was balance in life.  Pt participated in the discussion about when their life was in balance and out of balance and how this feels.  Pt discussed ways to get back in balance and short term goals they can work on to get where they want to be.    Daryel Gerald B 03/23/2013 3:09 PM

## 2013-03-23 NOTE — ED Notes (Signed)
Report called to [facility] ,Kaitlyn Gray. Accepting .

## 2013-03-23 NOTE — Progress Notes (Signed)
Patient ID: Kaitlyn Gray, female   DOB: 1975-11-08, 37 y.o.   MRN: [id number] Nursing admission note:  Patient is a 37 yo female admitted from [facility] for overdose of Seroquel.  Patient arrived via EMS after 2 seroquel tablets.  She reports that she walked to the bus stop to pick up her daughter and she passed out.  Patient reports waking up in the ED.  She denies any SI, however, states that she was overdosing on sleeping pills.she stated in the ED that she wanted to sleep and not wake up.  Patient has history of depression and suicide attempts.  Patient lives with her aunt and patient's 2 children and is unemployed.  She reported that she does not get along with her aunt.  Patient does not like her current living situation and states that she cannot find affordable housing that will allow her to have children.  Patient denies any alcohol or drug use, however, she is positive for THC.  Patient has been agitated and screaming since her arrival.  She is refusing any medication and a 2nd opinion for forced meds has been ordered.  Patient is denying all symptoms and states, "I just want to go home to my children!"  Patient sees July Dowliss with [facility] for her medications.

## 2013-03-23 NOTE — Tx Team (Signed)
Initial Interdisciplinary Treatment Plan  PATIENT STRENGTHS: (choose at least two) Average or above average intelligence Physical Health Supportive family/friends  PATIENT STRESSORS: Financial difficulties Marital or family conflict Occupational concerns Substance abuse   PROBLEM LIST: Problem List/Patient Goals Date to be addressed Date deferred Reason deferred Estimated date of resolution  Family conflict 03/23/2013     Suicidal Ideation 03/23/2013     THC abuse 03/23/2013     Living arrangements not acceptable to patient 03/23/2013                                    DISCHARGE CRITERIA:  Ability to meet basic life and health needs Improved stabilization in mood, thinking, and/or behavior Motivation to continue treatment in a less acute level of care Need for constant or close observation no longer present Reduction of life-threatening or endangering symptoms to within safe limits Verbal commitment to aftercare and medication compliance  PRELIMINARY DISCHARGE PLAN: Attend aftercare/continuing care group Outpatient therapy Return to previous living arrangement  PATIENT/FAMIILY INVOLVEMENT: This treatment plan has been presented to and reviewed with the patient, Kaitlyn Gray.  The patient and family have been given the opportunity to ask questions and make suggestions.  Cranford Mon 03/23/2013, 4:21 PM
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
## 2013-03-23 NOTE — Consult Note (Signed)
Patient needs inpatient admission 

## 2013-03-23 NOTE — ED Notes (Signed)
PT returned to room waiting transport to [facility] with [employer].

## 2013-03-23 NOTE — Progress Notes (Signed)
Patient ID: Kaitlyn Gray, female   DOB: August 02, 1975, 37 y.o.   MRN: [id number] D: pt. Reports "doing better since I seen my babies" "I been in here since yesterday, well at [facility], I went to pick up my daughter and I was dizzy so I laid on the ground, my aunt called the ambulance" "instead of her waiting to let the medicine wear off she called the ambulance" " I take 300mg  of Seroquel, one bottle got 150mg  in it and the other has 300mg , so I took 150mg  and it didn't make me sleepy so I took the 300mg  and it was to much made me too sleepy" "I want to go home, my daughter got an appointment with disability and it took me a long time to get this appointment and if she misses this appointment I don't know when we will get another one" "I been hollering and stuff because I need to go home and they think something wrong with me and making me stay, but they don't understand I got to get my daughter to this appointment." A: Writer introduced self to client and provide emotional support, encouraged client to call aunt, give her the information and ask her to take daughter to the appointment. Staff will monitor q52min for safety. Staff encouraged group. R: pt. Is safe on the unit, but did not attend group. Pt. Called and arranged for aunt and cousin to take daughter to appointment.

## 2013-03-23 NOTE — Progress Notes (Signed)
Patient ID: Kaitlyn Gray, female   DOB: 1975/10/03, 37 y.o.   MRN: [id number] Patient has been irritable and extremely argumentative today.  Patient starts to yell, sob and scream when staff tries to reason with her.  She refuses any medications.  She is now on forced medications.  Patient agreed to take Geodon 20 and Benedryl 50 po from staff.  She has been complaining of pain in her neck and arm from a previous geodon injection in the ED last night.  She also has had somatic complains from back and leg pain injunction with the neck and arm pain.  Patient was given tylenol for her pain.  Patient is not receptive to staff and consistently talks over anyone who tries to reason with her.  She is verbally abuse, however, has not exhibited any physical aggression.

## 2013-03-23 NOTE — ED Notes (Addendum)
Pt at desk- asking to go home states "I have an appt with social services for my daughter because she is ADHD tomorrow and no one else can take her" crying, stating "I miss my kids" escorted back to room, security and [employer] in hallway- pt threw coffee and part of breakfast on the floor- yelling "I want to go home!! I am not crazy- I want to take care of my son- I don't want him with anyone else"  Explained that she was going to [facility]- and that she could have medicine to calm down, pt yelled "You are not fucking shooting me up anymore! I am not crazy!"

## 2013-03-24 DIAGNOSIS — F332 Major depressive disorder, recurrent severe without psychotic features: Principal | ICD-10-CM

## 2013-03-24 MED ORDER — IBUPROFEN 600 MG PO TABS
600.0000 mg | ORAL_TABLET | Freq: Four times a day (QID) | ORAL | Status: DC | PRN
  Administered 2013-03-24 – 2013-03-25 (×2): 600 mg via ORAL
  Filled 2013-03-24 (×2): qty 1

## 2013-03-24 MED ORDER — DIVALPROEX SODIUM ER 500 MG PO TB24
500.0000 mg | ORAL_TABLET | Freq: Two times a day (BID) | ORAL | Status: DC
  Administered 2013-03-24 – 2013-03-25 (×2): 500 mg via ORAL
  Filled 2013-03-24: qty 1
  Filled 2013-03-24: qty 6
  Filled 2013-03-24 (×2): qty 1
  Filled 2013-03-24: qty 6
  Filled 2013-03-24: qty 1

## 2013-03-24 MED ORDER — HYDROXYZINE HCL 50 MG PO TABS
50.0000 mg | ORAL_TABLET | Freq: Every evening | ORAL | Status: DC | PRN
  Administered 2013-03-24: 50 mg via ORAL
  Filled 2013-03-24: qty 1

## 2013-03-24 NOTE — Progress Notes (Signed)
[facility] MD Progress Note  03/24/2013 2:00 PM Kaitlyn Gray  MRN:  [id number] Subjective:   Patient states "I just want to go home. My kids need me and my Aunt does not know how to take care of them. Please let me go home. I need something for my neck because it is a little sore."   Objective:  Patient became very agitated today after speaking with the MD.  The patient remains labile and easily agitated. She could be heard screaming loudly and making many rude remarks after finding out that she would not be leaving today. The patient required verbal de-escalation from several staff members to regain control. Patient calmer later when speaking to Clinical research associate but focused on showing writer an appointment letter that her daughter had for today. Patient complaining of right neck pain she reports started after receiving an injection of geodon in the ED. On physical exam the patient has full range of motion of neck and shoulders with no obvious deformity.   Diagnosis:   DSM5:  Axis I: Major Depression, Recurrent severe, without mention of psychotic behavior Axis II: Deferred Axis III:  Past Medical History  Diagnosis Date  . Diabetes mellitus without complication     gestational   Axis IV: economic problems, housing problems, occupational problems, other psychosocial or environmental problems and problems with primary support group Axis V: 41-50 serious symptoms  ADL's:  Intact  Sleep: Fair  Appetite:  Fair  Suicidal Ideation:  Denies Homicidal Ideation:  Denies AEB (as evidenced by):  Psychiatric Specialty Exam: Review of Systems  Constitutional: Negative.   Eyes: Negative.   Respiratory: Negative.   Cardiovascular: Negative.   Gastrointestinal: Negative.   Genitourinary: Negative.   Musculoskeletal: Positive for neck pain.  Skin: Negative.   Neurological: Negative.   Endo/Heme/Allergies: Negative.   Psychiatric/Behavioral: Positive for depression and substance abuse. Negative for  suicidal ideas, hallucinations and memory loss. The patient is nervous/anxious. The patient does not have insomnia.     Blood pressure 129/94, pulse 84, temperature 98.3 F (36.8 C), temperature source Oral, resp. rate 16, height 5\' 4"  (1.626 m), weight 101.606 kg (224 lb), last menstrual period 03/21/2013.Body mass index is 38.43 kg/(m^2).  General Appearance: Disheveled  Eye Contact::  Good  Speech:  Clear and Coherent  Volume:  Increased  Mood:  Angry, Anxious and Irritable  Affect:  Depressed and Labile  Thought Process:  Goal Directed and Intact  Orientation:  Full (Time, Place, and Person)  Thought Content:  Rumination  Suicidal Thoughts:  No  Homicidal Thoughts:  No  Memory:  Immediate;   Good Recent;   Good Remote;   Good  Judgement:  Poor  Insight:  Lacking  Psychomotor Activity:  Increased  Concentration:  Fair  Recall:  Good  Akathisia:  No  Handed:  Right  AIMS (if indicated):     Assets:  Communication Skills Desire for Improvement Housing Intimacy Leisure Time Physical Health Resilience  Sleep:  Number of Hours: 3.75   Current Medications: Current Facility-Administered Medications  Medication Dose Route Frequency Provider Last Rate Last Dose  . acetaminophen (TYLENOL) tablet 650 mg  650 mg Oral Q6H PRN Audrea Muscat, NP   650 mg at 03/24/13 0113  . alum & mag hydroxide-simeth (MAALOX/MYLANTA) 200-200-20 MG/5ML suspension 30 mL  30 mL Oral Q4H PRN Evanna [name], NP      . divalproex (DEPAKOTE ER) 24 hr tablet 500 mg  500 mg Oral BID PC Mojeed Akintayo      .  FLUoxetine (PROZAC) capsule 20 mg  20 mg Oral Daily Mojeed Akintayo   20 mg at 03/24/13 0750  . ibuprofen (ADVIL,MOTRIN) tablet 600 mg  600 mg Oral Q6H PRN Fransisca Kaufmann, NP      . magnesium hydroxide (MILK OF MAGNESIA) suspension 30 mL  30 mL Oral Daily PRN Evanna [name], NP      . OLANZapine zydis (ZYPREXA) disintegrating tablet 10 mg  10 mg Oral Q8H PRN Mojeed Akintayo   10 mg at  03/24/13 0113  . traZODone (DESYREL) tablet 100 mg  100 mg Oral QHS PRN,MR X 1 Mojeed Akintayo   100 mg at 03/23/13 2236    Lab Results: No results found for this or any previous visit (from the past 48 hour(s)).  Physical Findings: AIMS: Facial and Oral Movements Muscles of Facial Expression: None, normal Lips and Perioral Area: None, normal Jaw: None, normal Tongue: None, normal,Extremity Movements Upper (arms, wrists, hands, fingers): None, normal Lower (legs, knees, ankles, toes): None, normal, Trunk Movements Neck, shoulders, hips: None, normal, Overall Severity Severity of abnormal movements (highest score from questions above): None, normal Incapacitation due to abnormal movements: None, normal Patient's awareness of abnormal movements (rate only patient's report): No Awareness, Dental Status Current problems with teeth and/or dentures?: No Does patient usually wear dentures?: No  CIWA:    COWS:     Treatment Plan Summary: Daily contact with patient to assess and evaluate symptoms and progress in treatment Medication management  Plan: Continue crisis management and stabilization.  Medication management: Continue Prozac 20 mg for depressive symptoms. Initiate Depakote 500 mg BID to improve the stability of patient's mood. Continue Trazodone prn for sleep. Add Vistaril 50 mg hs prn insomnia.  Encouraged patient to attend groups and participate in group counseling sessions and activities.  Discharge plan in progress. ELOS 1-2 days. Will monitor behavior for continued outbursts on the unit.  Continue current treatment plan.  Address health issues: Vitals reviewed and stable. Order motrin prn for complaints of right neck pain.   Medical Decision Making Problem Points:  Established problem, worsening (2) and Review of psycho-social stressors (1) Data Points:  Review of medication regiment & side effects (2)  I certify that inpatient services furnished can reasonably be  expected to improve the patient's condition.   Keturah Yerby NP-C 03/24/2013, 2:00 PM

## 2013-03-24 NOTE — Tx Team (Signed)
  Interdisciplinary Treatment Plan Update   Date Reviewed:  03/24/2013  Time Reviewed:  8:01 AM  Progress in Treatment:   Attending groups: Yes Participating in groups: Yes Taking medication as prescribed: Yes  Tolerating medication: Yes Family/Significant other contact made: No Patient understands diagnosis: Yes  As evidenced by asking for help with depression and stress Discussing patient identified problems/goals with staff: Yes  See initial care plan Medical problems stabilized or resolved: Yes Denies suicidal/homicidal ideation: Yes  In tx team Patient has not harmed self or others: Yes  For review of initial/current patient goals, please see plan of care.  Estimated Length of Stay:  4-5 days  Reason for Continuation of Hospitalization: Depression Medication stabilization  New Problems/Goals identified:  N/A  Discharge Plan or Barriers:   return home, follow up outpt  Additional Comments:  "I been in here since yesterday, well at [facility], I went to pick up my daughter and I was dizzy so I laid on the ground, my aunt called the ambulance" "instead of her waiting to let the medicine wear off she called the ambulance" " I take 300mg  of Seroquel, one bottle got 150mg  in it and the other has 300mg , so I took 150mg  and it didn't make me sleepy so I took the 300mg  and it was to much made me too sleepy" "I want to go home, my daughter got an appointment with disability and it took me a long time to get this appointment and if she misses this appointment I don't know when we will get another one" "I been hollering and stuff because I need to go home and they think something wrong with me and making me stay, but they don't understand I got to get my daughter to this appointment."    Attendees:  Signature: Thedore Mins, MD 03/24/2013 8:01 AM   Signature: Richelle Ito, LCSW 03/24/2013 8:01 AM  Signature: Fransisca Kaufmann, NP 03/24/2013 8:01 AM  Signature: Joslyn Devon, RN 03/24/2013 8:01 AM   Signature: Liborio Nixon, RN 03/24/2013 8:01 AM  Signature:  03/24/2013 8:01 AM  Signature:   03/24/2013 8:01 AM  Signature:    Signature:    Signature:    Signature:    Signature:    Signature:      Scribe for Treatment Team:   Richelle Ito, LCSW  03/24/2013 8:01 AM

## 2013-03-24 NOTE — [facility] Counselor (Signed)
Adult Comprehensive Assessment  Patient ID: Kaitlyn Gray, female   DOB: 05/06/1976, 37 y.o.   MRN: [id number]  Information Source:    Current Stressors:  Educational / Learning stressors: N/A Employment / Job issues: Yes, unemployment Family Relationships: N/A Surveyor, quantity / Lack of resources (include bankruptcy): Yes, no income  Housing / Lack of housing: N/A Physical health (include injuries & life threatening diseases): N/A Social relationships: N/A Substance abuse: N/A Bereavement / Loss: N/A  Living/Environment/Situation:  Living Arrangements: Other relatives (aunt) Living conditions (as described by patient or guardian): "It's good."   How long has patient lived in current situation?: 3 months What is atmosphere in current home: Comfortable  Family History:  Marital status: Single Does patient have children?: Yes How many children?: 2 How is patient's relationship with their children?: 1 son and 1 daughter.  "It's good."    Childhood History:  By whom was/is the patient raised?: Mother Description of patient's relationship with caregiver when they were a child: "Good."   Patient's description of current relationship with people who raised him/her: Mother passed away 11 years.  Father passed away, did not indicated when.   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  )  Alcohol/Substance Abuse:   What has been your use of drugs/alcohol within the last 12 months?: Denies  If attempted suicide, did drugs/alcohol play a role in this?: No Alcohol/Substance Abuse Treatment Hx: Denies past history Has alcohol/substance abuse ever caused legal problems?: No  Social Support System:   Conservation officer, nature Support System: Good Describe Community Support System: Family  Type of faith/religion: None  How does patient's faith help to cope with current illness?: None   Leisure/Recreation:   Leisure and Hobbies: "I don't know."    Strengths/Needs:   What things does the patient do well?: Being a mom.   In what areas does patient struggle / problems for patient: Transporation   Discharge Plan:   Does patient have access to transportation?: Yes Will patient be returning to same living situation after discharge?: Yes Currently receiving community mental health services: Yes (From Whom) [name] ) If no, would patient like referral for services when discharged?: No Does patient have financial barriers related to discharge medications?: No  Summary/Recommendations:   Summary and Recommendations (to be completed by the evaluator): Kaitlyn Gray is a 36 YO AA female who is here after an attempted suicide.  Presents as very vague, guarded, and flat.  Was not very open to discuss questions and wanted to know when she will D/C.  She is supported by her family members.  She is currently seeing [facility] for her mental  health services.  She can benefit from crisis stabalization, medication management, therapeutic milieu and referral for services.    Daryel Gerald B. 03/24/2013

## 2013-03-24 NOTE — ED Provider Notes (Signed)
Medical screening examination/treatment/procedure(s) were performed by non-physician practitioner and as supervising physician I was immediately available for consultation/collaboration.   Roney Marion, MD 03/24/13 [phone]

## 2013-03-24 NOTE — [facility] Group Notes (Signed)
[facility] Group Notes:  (Nursing/MHT/Case Management/Adjunct)  Date:  03/24/2013 Time:  1:14 PM  Type of Therapy:  Group Therapy   Participation Level:  Active  Participation Quality:  Appropriate  Affect:  Appropriate  Cognitive:  Appropriate  Insight:  Limited  Engagement in Group:  Engaged  Modes of Intervention: Discussion, Exploration and Socialization   Summary of Progress/Problems:  The topic for group was balance in life. Pt participated in the discussion about when their life was in balance and out of balance and how this feels. Pt discussed ways to get back in balance and short term goals they can work on to get where they want to be.  In the beginning of group, pt had hands on her face and neck in a guarded position.  Asked about 72-hr hold on IVC.    Stated that she feels balance because she is not as angry today and was able to see her children yesterday. She identified that she was able to stay calm by thinking about her actions and consequences.  She stated that she is able to get into a more balanced place by reading, listening to music, going outside, and calling someone.   Understood that she has to stay calm in order to D/C.  Described suicide attempt experience, does not believe she should be here.  Stated that she was not trying to hurt herself.  Has limited insight.    Towards the end of group her presentation switched to appropriate, calm and talkative.       [name] MSW Intern  03/24/2013 1:14 PM

## 2013-03-24 NOTE — Progress Notes (Signed)
D:Pt is anxious and agitated wanting to leave the hospital. Pt is focused on seeing her children. When writer asked pt about hallucinations pt responded "I have not had any since I have been here." Pt is guarded when interacting with staff. A:Offered support, encouragement and 15 minute checks. Gave medications as ordered. R:Pt denies si and hi. Safety maintained on the unit.

## 2013-03-25 DIAGNOSIS — F39 Unspecified mood [affective] disorder: Secondary | ICD-10-CM

## 2013-03-25 MED ORDER — TRAZODONE HCL 100 MG PO TABS: 100.0000 mg | ORAL_TABLET | Freq: Every evening | ORAL | Status: DC | PRN

## 2013-03-25 MED ORDER — FLUOXETINE HCL 20 MG PO CAPS: 20.0000 mg | ORAL_CAPSULE | Freq: Every day | ORAL | Status: DC

## 2013-03-25 MED ORDER — DIVALPROEX SODIUM ER 500 MG PO TB24: 500.0000 mg | ORAL_TABLET | Freq: Two times a day (BID) | ORAL | Status: DC

## 2013-03-25 NOTE — Progress Notes (Signed)
[facility] Adult Case Management Discharge Plan :  Will you be returning to the same living situation after discharge: Yes,  home with aunt At discharge, do you have transportation home?:Yes,  family Do you have the ability to pay for your medications:Yes,  mental health  Release of information consent forms completed and in the chart;  Patient's signature needed at discharge.  Patient to Follow up at: Follow-up Information   Follow up with [facility]. (Go to the walk in clinic M-F between 8 and 9AM for your follow up appointment.)    Contact information:   [address]  [place]  [336] [phone]      Patient denies SI/HI:   Yes,  yes    Safety Planning and Suicide Prevention discussed:  Yes,  yes  Ida Rogue 03/25/2013, 9:51 AM

## 2013-03-25 NOTE — [facility] Suicide Risk Assessment (Cosign Needed)
[facility] INPATIENT:  Family/Significant Other Suicide Prevention Education  Suicide Prevention Education:  Education Completed; Kaitlyn Gray, [phone] has been identified by the patient as the family member/significant other with whom the patient will be residing, and identified as the person(s) who will aid the patient in the event of a mental health crisis (suicidal ideations/suicide attempt).  With written consent from the patient, the family member/significant other has been provided the following suicide prevention education, prior to the and/or following the discharge of the patient.  The suicide prevention education provided includes the following:  Suicide risk factors  Suicide prevention and interventions  National Suicide Hotline telephone number  [facility] assessment telephone number  [facility] Emergency Assistance 911  [facility] and/or Residential Mobile Crisis Unit telephone number  Request made of family/significant other to:  Remove weapons (e.g., guns, rifles, knives), all items previously/currently identified as safety concern.    Remove drugs/medications (over-the-counter, prescriptions, illicit drugs), all items previously/currently identified as a safety concern.  The family member/significant other verbalizes understanding of the suicide prevention education information provided.  The family member/significant other agrees to remove the items of safety concern listed above.  Kaitlyn Gray 03/25/2013, 11:05 AM

## 2013-03-25 NOTE — Progress Notes (Signed)
Seen and agreed. Emmalynne Courtney, MD 

## 2013-03-25 NOTE — Tx Team (Signed)
  Interdisciplinary Treatment Plan Update   Date Reviewed:  03/25/2013  Time Reviewed:  9:46 AM  Progress in Treatment:   Attending groups: Yes Participating in groups: Yes Taking medication as prescribed: Yes  Tolerating medication: Yes Family/Significant other contact made: Yes  Patient understands diagnosis: Yes  Discussing patient identified problems/goals with staff: Yes Medical problems stabilized or resolved: Yes Denies suicidal/homicidal ideation: Yes Patient has not harmed self or others: Yes  For review of initial/current patient goals, please see plan of care.  Estimated Length of Stay:  D/C today  Reason for Continuation of Hospitalization:   New Problems/Goals identified:  N/A  Discharge Plan or Barriers:   return home, follow up outpt  Additional Comments:  Attendees:  Signature: Thedore Mins, MD 03/25/2013 9:46 AM   Signature: Richelle Ito, LCSW 03/25/2013 9:46 AM  Signature: Fransisca Kaufmann, NP 03/25/2013 9:46 AM  Signature: Joslyn Devon, RN 03/25/2013 9:46 AM  Signature: Liborio Nixon, RN 03/25/2013 9:46 AM  Signature:  03/25/2013 9:46 AM  Signature:   03/25/2013 9:46 AM  Signature:    Signature:    Signature:    Signature:    Signature:    Signature:      Scribe for Treatment Team:   Richelle Ito, LCSW  03/25/2013 9:46 AM

## 2013-03-25 NOTE — Progress Notes (Signed)
Recreation Therapy Notes  Date: 10.10.2014 Time: 9:30am Location: [address] Dayroom  Group Topic: Leisure Education  Goal Area(s) Addresses:  Patient will verbalize activity of interest by end of group session. Patient will verbalize the ability to use positive leisure/recreation as a coping mechanism.  Behavioral Response: Did not attend  Jearl Klinefelter, LRT/CTRS  Autry Droege L 03/25/2013 10:28 AM

## 2013-03-25 NOTE — Discharge Summary (Signed)
Physician Discharge Summary Note  Patient:  Kaitlyn Gray is an 37 y.o., female MRN:  [id number] DOB:  31-Jan-1976 Patient phone:  [phone] (home)  Patient address:   [address] [place] [address]   Date of Admission:  03/23/2013 Date of Discharge: 03/25/13  Discharge Diagnoses: Principal Problem:   Major depressive disorder, recurrent episode, severe, without mention of psychotic behavior Active Problems:   Cannabis abuse  Axis Diagnosis:  AXIS I: Major depressive disorder, recurrent episode, severe, without mention of psychotic behavior  Unspecified episodic mood disorder  AXIS II: Deferred  AXIS III:  Past Medical History   Diagnosis  Date   .  Diabetes mellitus without complication      gestational    AXIS IV: economic problems, other psychosocial or environmental problems and problems related to social environment  AXIS V: 61-70 mild symptoms  Level of Care:  OP  Hospital Course:   Kaitlyn Gray is a 37 year old female who admitted voluntarily to [facility] after overdosing on seroquel in a suicide attempt after an argument with her Aunt. The patient had recently been in the ED in September for suicidal ideation with plan to overdose and was discharged home. After having a tele-psych assessment the patient was found to meet criteria for inpatient admission. She minimizes her suicide attempt and is only focused on getting back home to take care of her children. The patient admitted to misusing her seroquel taking it "To sleep during the day when I can't control my emotions and I'm really anxious." On arrival to the unit today the patient was very angry and upset. She could be heard screaming loudly over and over that "I just want to go home. Help me Jesus!" Patient on initial contact only would glare angrily at this Clinical research associate. When patient asked to answer basic admission questions became agitated stating "Why should I answer stupid questions when I need to go home. My  children need me. They are acting out. I can't be in here." The patient began to escalate to the point she was screaming very loudly and the interview was terminated. Patient was able to answer few basic questions but was very guarded and hostile.   While a patient in this hospital, Kaitlyn Gray was enrolled in group counseling and activities as well as received the following medication Current facility-administered medications:acetaminophen (TYLENOL) tablet 650 mg, 650 mg, Oral, Q6H PRN, Audrea Muscat, NP, 650 mg at 03/24/13 0113;  alum & mag hydroxide-simeth (MAALOX/MYLANTA) 200-200-20 MG/5ML suspension 30 mL, 30 mL, Oral, Q4H PRN, Audrea Muscat, NP, 30 mL at 03/25/13 0940;  divalproex (DEPAKOTE ER) 24 hr tablet 500 mg, 500 mg, Oral, BID PC, Mojeed Akintayo, 500 mg at 03/25/13 0939 FLUoxetine (PROZAC) capsule 20 mg, 20 mg, Oral, Daily, Mojeed Akintayo, 20 mg at 03/25/13 0757;  hydrOXYzine (ATARAX/VISTARIL) tablet 50 mg, 50 mg, Oral, QHS PRN, Fransisca Kaufmann, NP, 50 mg at 03/24/13 2220;  ibuprofen (ADVIL,MOTRIN) tablet 600 mg, 600 mg, Oral, Q6H PRN, Fransisca Kaufmann, NP, 600 mg at 03/25/13 0942;  magnesium hydroxide (MILK OF MAGNESIA) suspension 30 mL, 30 mL, Oral, Daily PRN, Audrea Muscat, NP OLANZapine zydis (ZYPREXA) disintegrating tablet 10 mg, 10 mg, Oral, Q8H PRN, Mojeed Akintayo, 10 mg at 03/24/13 0113;  traZODone (DESYREL) tablet 100 mg, 100 mg, Oral, QHS PRN,MR X 1, Mojeed Akintayo, 100 mg at 03/24/13 2326 The patient was initially very agitated on angry on admission. She was noted to scream when staff attempted to talk with her  only being focused on being discharged to take care of her children. After talking with staff the patient was able remain calm and stated "I see now I was making it hard for myself. I needed some help." Patient requested to have her seroquel reordered but this was not done due to the fact that she had been abusing it and had overdosed on that medication. Kaitlyn Gray  was prescribed Prozac 20 mg daily for depressive symptoms. Patient was also started on Depakote 500 mg BID for mood stability. The patient reported that she had taken this before in the past. Patient was able to stay calm for a period of twenty four hours and participate appropriately on the unit. The patient was found stable for discharge.  Patient attended treatment team meeting this am and met with treatment team members. Pt symptoms, treatment plan and response to treatment discussed. Kaitlyn Gray endorsed that their symptoms have improved. Pt also stated that they are stable for discharge.  In other to control Principal Problem:   Major depressive disorder, recurrent episode, severe, without mention of psychotic behavior Active Problems:   Cannabis abuse , they will continue psychiatric care on outpatient basis. They will follow-up at  Follow-up Information   Follow up with [facility]. (Go to the walk in clinic M-F between 8 and 9AM for your follow up appointment.)    Contact information:   [address]  [place]  [phone]    .  In addition they were instructed to take all your medications as prescribed by your mental healthcare provider, to report any adverse effects and or reactions from your medicines to your outpatient provider promptly, patient is instructed and cautioned to not engage in alcohol and or illegal drug use while on prescription medicines, in the event of worsening symptoms, patient is instructed to call the crisis hotline, 911 and or go to the nearest ED for appropriate evaluation and treatment of symptoms.   Upon discharge, patient adamantly denies suicidal, homicidal ideations, auditory, visual hallucinations and or delusional thinking. They left [facility] with all personal belongings in no apparent distress.  Consults:  See electronic record for details  Significant Diagnostic Studies:  See electronic record for details  Discharge Vitals:   Blood pressure 139/98,  pulse 82, temperature 97.8 F (36.6 C), temperature source Oral, resp. rate 16, height 5\' 4"  (1.626 m), weight 101.606 kg (224 lb), last menstrual period 03/21/2013..  Mental Status Exam: See Mental Status Examination and Suicide Risk Assessment completed by Attending Physician prior to discharge.  Discharge destination:  Home  Is patient on multiple antipsychotic therapies at discharge:  No  Has Patient had three or more failed trials of antipsychotic monotherapy by history: N/A Recommended Plan for Multiple Antipsychotic Therapies: N/A    Medication List    STOP taking these medications       QUEtiapine 300 MG 24 hr tablet  Commonly known as:  SEROQUEL XR      TAKE these medications     Indication   divalproex 500 MG 24 hr tablet  Commonly known as:  DEPAKOTE ER  Take 1 tablet (500 mg total) by mouth 2 (two) times daily after a meal. For improved mood stability.   Indication:  mood lability     FLUoxetine 20 MG capsule  Commonly known as:  PROZAC  Take 1 capsule (20 mg total) by mouth daily. For depression.   Indication:  Major Depressive Disorder     traZODone 100 MG tablet  Commonly known as:  DESYREL  Take 1 tablet (100 mg total) by mouth at bedtime as needed and may repeat dose one time if needed for sleep.   Indication:  Trouble Sleeping           Follow-up Information   Follow up with [facility]. (Go to the walk in clinic M-F between 8 and 9AM for your follow up appointment.)    Contact information:   [address]  [place]  [phone]     Follow-up recommendations:   Activities: Resume typical activities Diet: Resume typical diet Tests: none Other: Follow up with outpatient provider and report any side effects to out patient prescriber.  Comments:  Take all your medications as prescribed by your mental healthcare provider. Report any adverse effects and or reactions from your medicines to your outpatient provider promptly. Patient is instructed  and cautioned to not engage in alcohol and or illegal drug use while on prescription medicines. In the event of worsening symptoms, patient is instructed to call the crisis hotline, 911 and or go to the nearest ED for appropriate evaluation and treatment of symptoms. Follow-up with your primary care provider for your other medical issues, concerns and or health care needs.  SignedFransisca Kaufmann NP-C 03/25/2013 10:01 AM

## 2013-03-25 NOTE — [facility] Suicide Risk Assessment (Signed)
Suicide Risk Assessment  Discharge Assessment     Demographic Factors:  Low socioeconomic status, Unemployed and female  Mental Status Per Nursing Assessment::   On Admission:     Current Mental Status by Physician: patient denies suicicdal ideation, intent or plan  Loss Factors: Financial problems/change in socioeconomic status  Historical Factors: NA  Risk Reduction Factors:   Responsible for children under 38 years of age, Sense of responsibility to family and Living with another person, especially a relative  Continued Clinical Symptoms:  Resolving depression and mood symptoms  Cognitive Features That Contribute To Risk:  Closed-mindedness Polarized thinking    Suicide Risk:  Minimal: No identifiable suicidal ideation.  Patients presenting with no risk factors but with morbid ruminations; may be classified as minimal risk based on the severity of the depressive symptoms  Discharge Diagnoses:   AXIS I:  Major depressive disorder, recurrent episode, severe, without mention of psychotic behavior               Unspecified episodic mood disorder  AXIS II:  Deferred AXIS III:   Past Medical History  Diagnosis Date  . Diabetes mellitus without complication     gestational   AXIS IV:  economic problems, other psychosocial or environmental problems and problems related to social environment AXIS V:  61-70 mild symptoms  Plan Of Care/Follow-up recommendations:  Activity:  as tolerated Diet:  healthy Tests:  Depakote level Other:  patient to keep her after care appointment  Is patient on multiple antipsychotic therapies at discharge:  No   Has Patient had three or more failed trials of antipsychotic monotherapy by history:  No  Recommended Plan for Multiple Antipsychotic Therapies: N/A  Thedore Mins, MD 03/25/2013, 10:03 AM

## 2013-03-26 ENCOUNTER — Emergency Department (HOSPITAL_COMMUNITY)
Admission: EM | Admit: 2013-03-26 | Discharge: 2013-03-26 | Disposition: A | Discharge status: Home / Self Care | Payer: Medicaid Other | Attending: Emergency Medicine | Admitting: Emergency Medicine

## 2013-03-26 ENCOUNTER — Encounter (HOSPITAL_COMMUNITY): Payer: Self-pay | Admitting: Emergency Medicine

## 2013-03-26 DIAGNOSIS — J029 Acute pharyngitis, unspecified: Secondary | ICD-10-CM | POA: Insufficient documentation

## 2013-03-26 DIAGNOSIS — Z91040 Latex allergy status: Secondary | ICD-10-CM | POA: Insufficient documentation

## 2013-03-26 DIAGNOSIS — H9209 Otalgia, unspecified ear: Secondary | ICD-10-CM | POA: Insufficient documentation

## 2013-03-26 DIAGNOSIS — Z79899 Other long term (current) drug therapy: Secondary | ICD-10-CM | POA: Insufficient documentation

## 2013-03-26 DIAGNOSIS — F17200 Nicotine dependence, unspecified, uncomplicated: Secondary | ICD-10-CM | POA: Insufficient documentation

## 2013-03-26 DIAGNOSIS — Z8632 Personal history of gestational diabetes: Secondary | ICD-10-CM | POA: Insufficient documentation

## 2013-03-26 MED ORDER — CETIRIZINE HCL 10 MG PO CAPS: 10.0000 mg | ORAL_CAPSULE | Freq: Every day | ORAL | Status: DC

## 2013-03-26 MED ORDER — NAPROXEN 500 MG PO TABS
500.0000 mg | ORAL_TABLET | Freq: Two times a day (BID) | ORAL | Status: DC
Start: 2013-03-26 — End: 2014-05-15

## 2013-03-26 MED ORDER — AMOXICILLIN 500 MG PO CAPS: 500.0000 mg | ORAL_CAPSULE | Freq: Three times a day (TID) | ORAL | Status: DC

## 2013-03-26 NOTE — ED Provider Notes (Signed)
CSN: [id number]     Arrival date & time 03/26/13  1232 History  This chart was scribed for non-physician practitioner, Westley Gambles, working with Flint Melter, MD by Clydene Laming, ED Scribe. This patient was seen in room TR04C/TR04C and the patient's care was started at 3:05 PM.   Chief Complaint  Patient presents with  . Otalgia  . Sore Throat    The history is provided by the patient. No language interpreter was used.   HPI Comments: Kaitlyn Gray is a 37 y.o. female who presents to the Emergency Department complaining of otalgia of the right ear and sore throat on the right side onset three days ago. Pt denies a hx of lung problems and asthma. Pt has a hx of gestational diabetes. Pt was given ibuprofen without relief. Pt is unsure of being exposed to any sick individuals. Pt was admitted into hospital on October 6th followed by [facility] on October 8th and was discharged yesterday. The onset of this condition was acute. The course is constant. Aggravating factors: none. Alleviating factors: none.    Past Medical History  Diagnosis Date  . 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 Cesarean section     No family history on file. History  Substance Use Topics  . Smoking status: Current Every Day Smoker  . Smokeless tobacco: Not on file  . Alcohol Use: No   OB History   Grav Para Term Preterm Abortions TAB SAB Ect Mult Living                 Review of Systems  Constitutional: Negative for fever, chills and fatigue.  HENT: Positive for ear pain and sore throat. Negative for congestion, rhinorrhea and sinus pressure.        Otalgia  Eyes: Negative for redness.  Respiratory: Negative for cough and wheezing.   Gastrointestinal: Negative for nausea, vomiting, abdominal pain and diarrhea.  Genitourinary: Negative for dysuria.  Musculoskeletal: Negative for myalgias and neck stiffness.  Skin: Negative for  rash.  Neurological: Negative for headaches.  Hematological: Negative for adenopathy.    Allergies  Latex  Home Medications   Current Outpatient Rx  Name  Route  Sig  Dispense  Refill  . divalproex (DEPAKOTE ER) 500 MG 24 hr tablet   Oral   Take 1 tablet (500 mg total) by mouth 2 (two) times daily after a meal. For improved mood stability.   60 tablet   0   . FLUoxetine (PROZAC) 20 MG capsule   Oral   Take 1 capsule (20 mg total) by mouth daily. For depression.   30 capsule   0   . traZODone (DESYREL) 100 MG tablet   Oral   Take 1 tablet (100 mg total) by mouth at bedtime as needed and may repeat dose one time if needed for sleep.   60 tablet   0    Triage Vitals:BP 130/70  Pulse 70  Temp(Src) 98.5 F (36.9 C) (Oral)  Resp 16  SpO2 98%  LMP 03/21/2013 Physical Exam  Nursing note and vitals reviewed. Constitutional: She appears well-developed and well-nourished.  HENT:  Head: Normocephalic and atraumatic.  Right Ear: Tympanic membrane, external ear and ear canal normal.  Left Ear: Tympanic membrane, external ear and ear canal normal.  Nose: Nose normal. No mucosal edema or rhinorrhea.  Mouth/Throat: Uvula is midline and mucous membranes are normal. Mucous membranes are not dry.  No oral lesions. No trismus in the jaw. No uvula swelling. Posterior oropharyngeal erythema present. No oropharyngeal exudate, posterior oropharyngeal edema or tonsillar abscesses.  Eyes: Conjunctivae are normal. Right eye exhibits no discharge. Left eye exhibits no discharge.  Neck: Normal range of motion. Neck supple.  Cardiovascular: Normal rate, regular rhythm and normal heart sounds.   Pulmonary/Chest: Effort normal and breath sounds normal. No respiratory distress. She has no wheezes. She has no rales.  Abdominal: Soft. There is no tenderness.  Lymphadenopathy:    She has no cervical adenopathy.  Neurological: She is alert.  Skin: Skin is warm and dry.  Psychiatric: She has a normal  mood and affect.    ED Course  Procedures (including critical care time) DIAGNOSTIC STUDIES: Oxygen Saturation is 98% on RA, normal by my interpretation.    COORDINATION OF CARE: 3:10 PM- Discussed treatment plan with pt at bedside. Pt verbalized understanding and agreement with plan.   Labs Review Labs Reviewed - No data to display Imaging Review No results found.  EKG Interpretation   None      Patient counseled on supportive care for viral URI and s/s to return including worsening symptoms, persistent fever, persistent vomiting, or if they have any other concerns.  Urged to see PCP if symptoms persist for more than 3 days. Patient verbalizes understanding and agrees with plan.   Patient is insistent on receiving antibiotics for tonsillitis. Pt counseled on want and see approach. Encouraged symptomatic care before empiric abx tx.    MDM   1. Pharyngitis    Patient with symptoms consistent with a viral syndrome.  Vitals are stable, no fever.  No signs of dehydration.  Lung exam normal, no signs of pneumonia.  Supportive therapy indicated with return if symptoms worsen.     I personally performed the services described in this documentation, which was scribed in my presence. The recorded information has been reviewed and is accurate.     Renne Crigler, PA-C 03/26/13 1642

## 2013-03-26 NOTE — ED Notes (Signed)
Pt. Stated, I was admitted to [facility] and I was discharged yesterday and I keep c/o of ear pain, throat pain and nobody would treat it.

## 2013-03-27 NOTE — ED Provider Notes (Signed)
Medical screening examination/treatment/procedure(s) were performed by non-physician practitioner and as supervising physician I was immediately available for consultation/collaboration.  Flint Melter, MD 03/27/13 [phone]

## 2013-03-30 NOTE — Progress Notes (Signed)
Patient Discharge Instructions:  After Visit Summary (AVS):   Faxed to:  03/30/13 Discharge Summary Note:   Faxed to:  03/30/13 Psychiatric Admission Assessment Note:   Faxed to:  03/30/13 Suicide Risk Assessment - Discharge Assessment:   Faxed to:  03/30/13 Faxed/Sent to the Next Level Care provider:  03/30/13 Faxed to [facility] of Care @ [phone] Faxed to [facility] @ [phone]  Jerelene Redden, 03/30/2013, 1:40 PM

## 2013-03-30 NOTE — Discharge Summary (Signed)
Seen and agreed. Mao Lockner, MD 

## 2014-05-14 ENCOUNTER — Encounter (HOSPITAL_COMMUNITY): Payer: Self-pay | Admitting: Emergency Medicine

## 2014-05-14 ENCOUNTER — Emergency Department (HOSPITAL_COMMUNITY): Payer: Medicaid Other

## 2014-05-14 ENCOUNTER — Emergency Department (HOSPITAL_COMMUNITY)
Admission: EM | Admit: 2014-05-14 | Discharge: 2014-05-15 | Disposition: A | Discharge status: Home / Self Care | Payer: Medicaid Other | Attending: Emergency Medicine | Admitting: Emergency Medicine

## 2014-05-14 DIAGNOSIS — Z720 Tobacco use: Secondary | ICD-10-CM | POA: Insufficient documentation

## 2014-05-14 DIAGNOSIS — S3992XA Unspecified injury of lower back, initial encounter: Secondary | ICD-10-CM | POA: Diagnosis present

## 2014-05-14 DIAGNOSIS — Y9289 Other specified places as the place of occurrence of the external cause: Secondary | ICD-10-CM | POA: Insufficient documentation

## 2014-05-14 DIAGNOSIS — M25521 Pain in right elbow: Secondary | ICD-10-CM

## 2014-05-14 DIAGNOSIS — Z91040 Latex allergy status: Secondary | ICD-10-CM | POA: Diagnosis not present

## 2014-05-14 DIAGNOSIS — Z8632 Personal history of gestational diabetes: Secondary | ICD-10-CM | POA: Insufficient documentation

## 2014-05-14 DIAGNOSIS — W108XXA Fall (on) (from) other stairs and steps, initial encounter: Secondary | ICD-10-CM | POA: Insufficient documentation

## 2014-05-14 DIAGNOSIS — M545 Low back pain, unspecified: Secondary | ICD-10-CM

## 2014-05-14 DIAGNOSIS — Y9389 Activity, other specified: Secondary | ICD-10-CM | POA: Insufficient documentation

## 2014-05-14 DIAGNOSIS — Y998 Other external cause status: Secondary | ICD-10-CM | POA: Insufficient documentation

## 2014-05-14 DIAGNOSIS — S24109A Unspecified injury at unspecified level of thoracic spinal cord, initial encounter: Secondary | ICD-10-CM | POA: Insufficient documentation

## 2014-05-14 DIAGNOSIS — S4991XA Unspecified injury of right shoulder and upper arm, initial encounter: Secondary | ICD-10-CM | POA: Diagnosis not present

## 2014-05-14 DIAGNOSIS — S59901A Unspecified injury of right elbow, initial encounter: Secondary | ICD-10-CM | POA: Diagnosis not present

## 2014-05-14 DIAGNOSIS — W19XXXA Unspecified fall, initial encounter: Secondary | ICD-10-CM

## 2014-05-14 IMAGING — CR DG LUMBAR SPINE COMPLETE 4+V
5 series · 5 of 5 positions shown · non-contrast
Comparison: CT of the abdomen and pelvis performed [DATE]

CLINICAL DATA: Status post fall down 5-6 steps. Hit back on steps,
with acute onset of lower back pain. Initial encounter.

EXAM:
LUMBAR SPINE - COMPLETE 4+ VIEW

[l-spine ap]
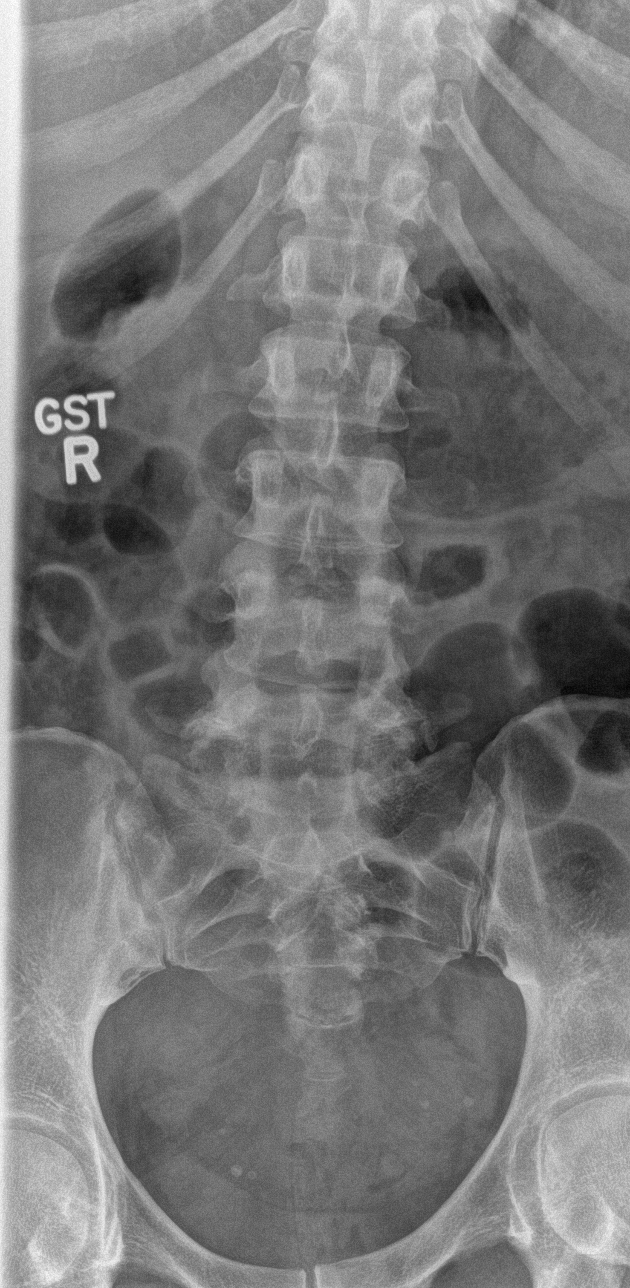

[l-spine obl (1 of 2)]
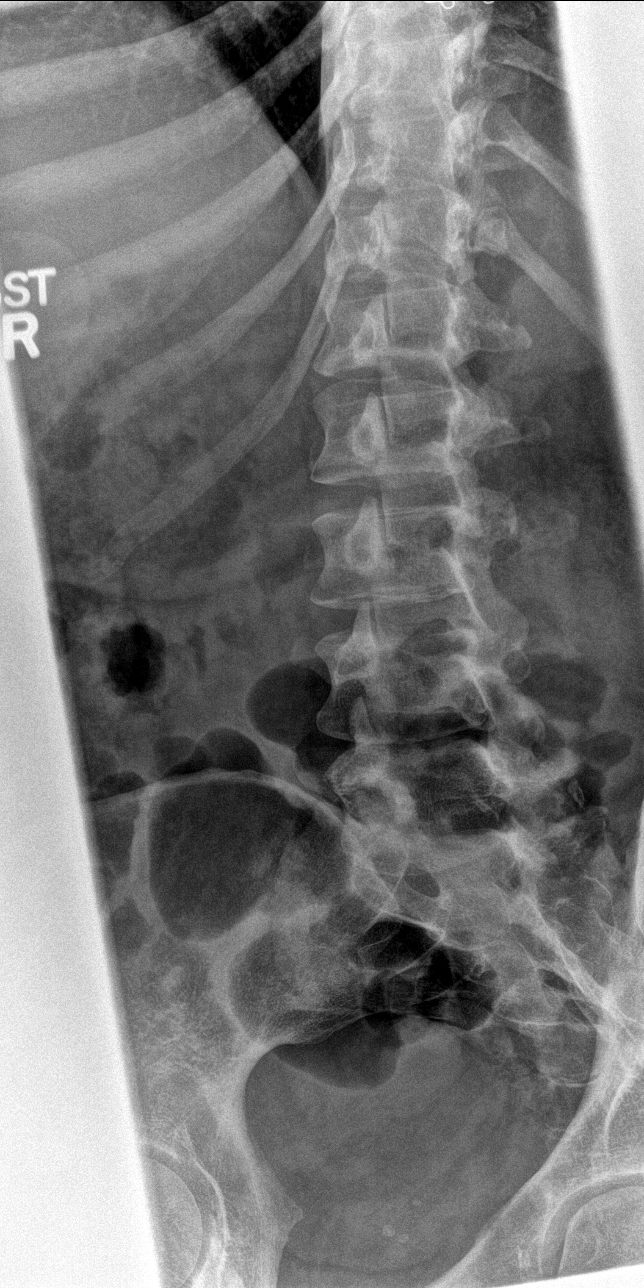

[l-spine obl (2 of 2)]
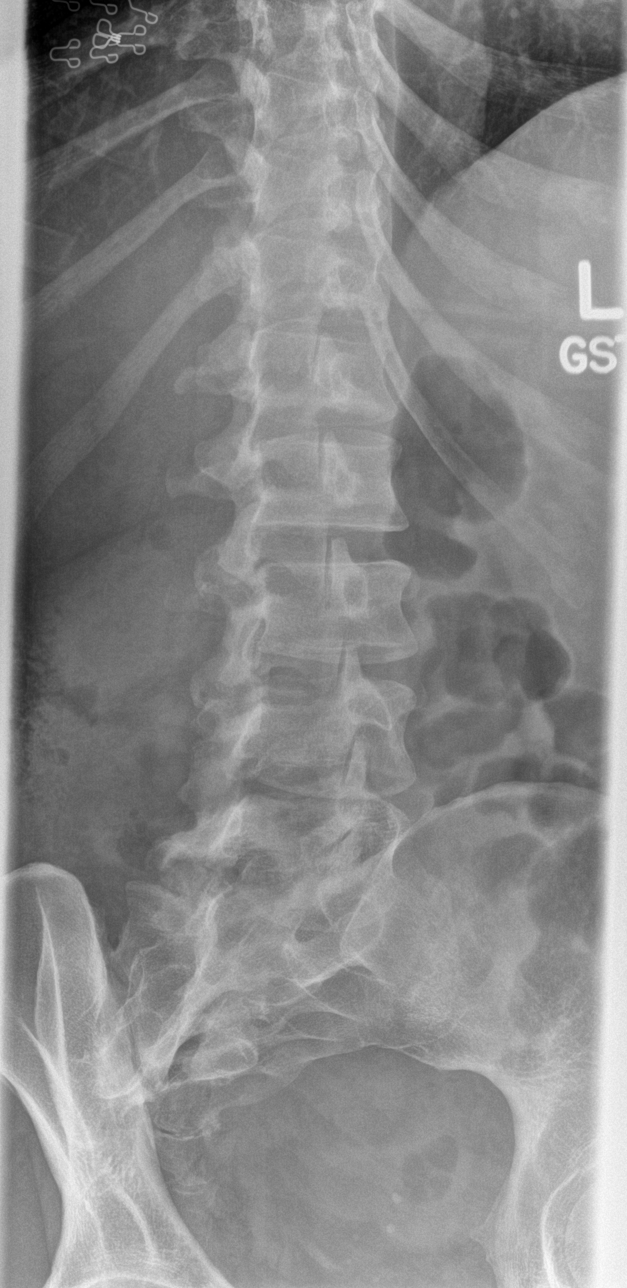

[l-spine lat]
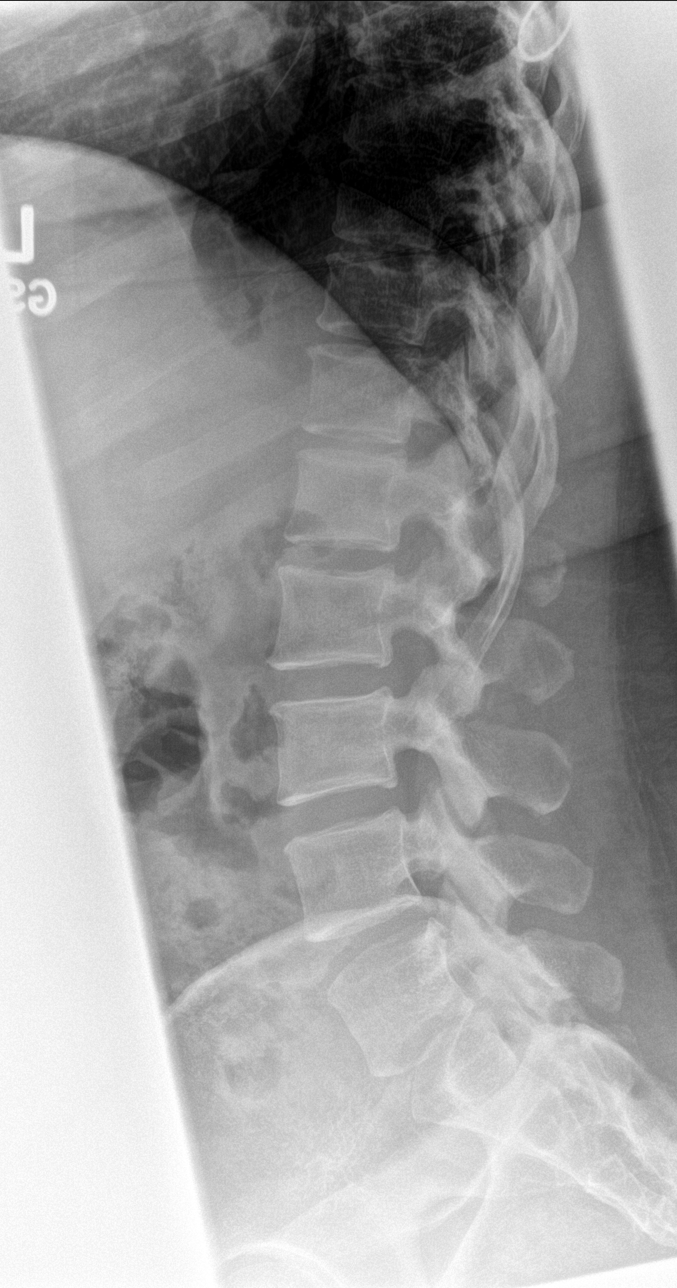

[l-spine spot]
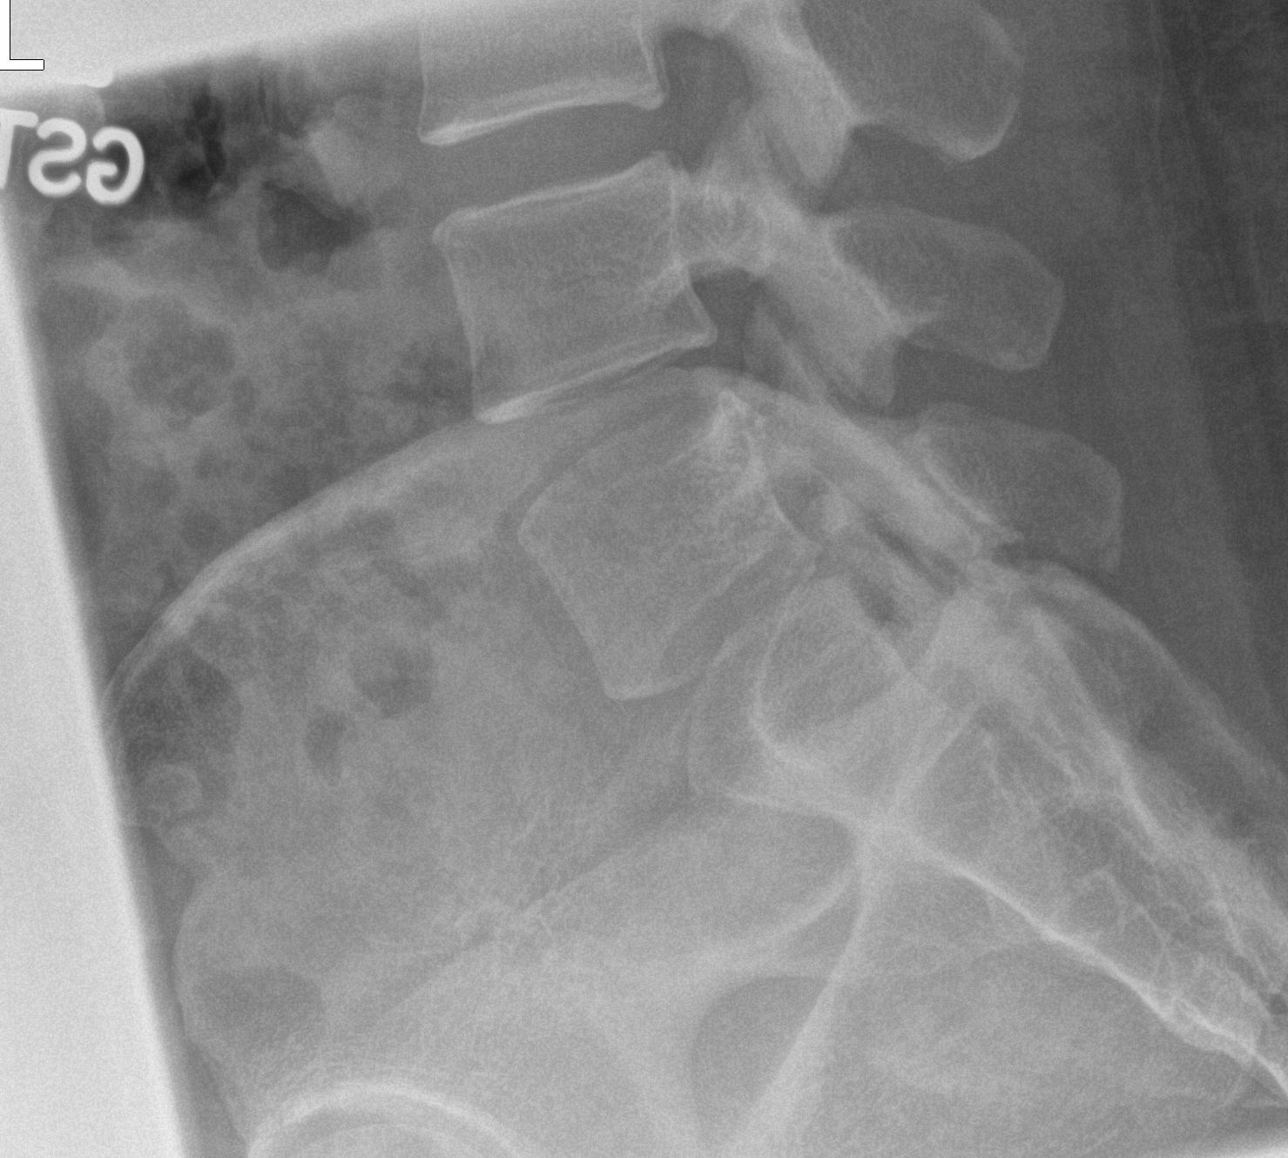

[5 of 5 positions shown; findings below may reference images not displayed]

FINDINGS: There is no evidence of fracture or subluxation. Vertebral bodies
demonstrate normal height and alignment. Intervertebral disc spaces
are preserved. The visualized neural foramina are grossly
unremarkable in appearance.

The visualized bowel gas pattern is unremarkable in appearance; air
and stool are noted within the colon. The sacroiliac joints are
within normal limits.
IMPRESSION: No evidence of fracture or subluxation along the lumbar spine.

## 2014-05-14 IMAGING — CR DG FOREARM 2V*R*
2 series · 2 of 2 positions shown · non-contrast
Comparison: None.

CLINICAL DATA: Fall with right forearm pain.  Initial encounter.

EXAM:
RIGHT FOREARM - 2 VIEW

[forearm ap]
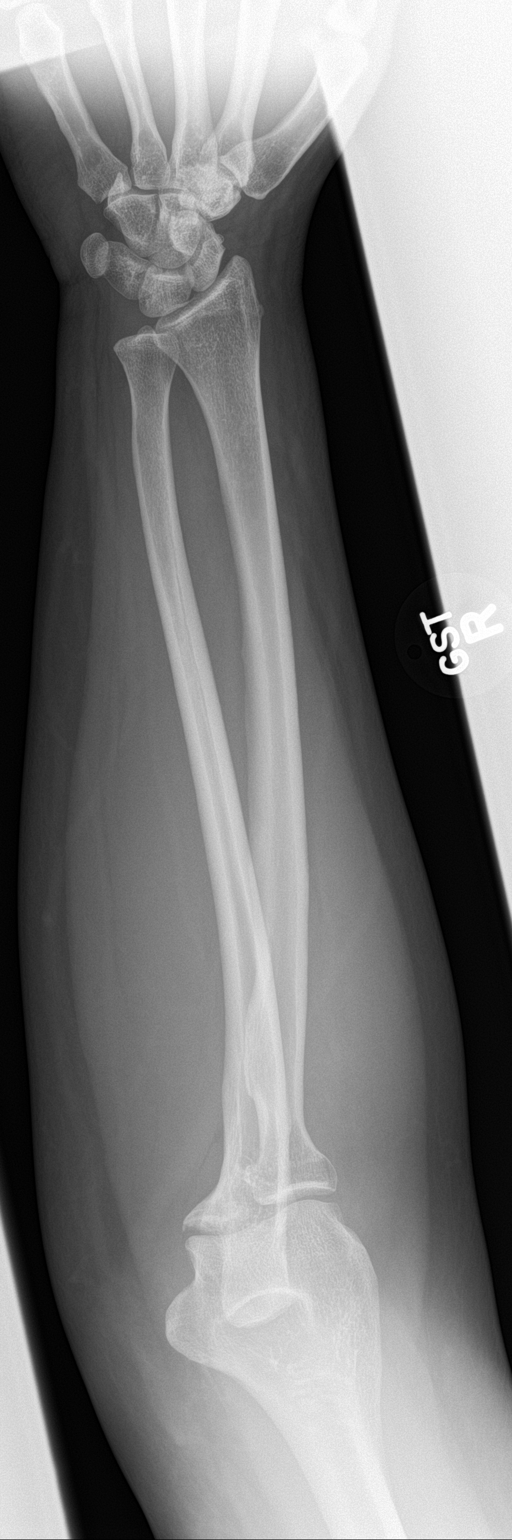

[forearm lat]
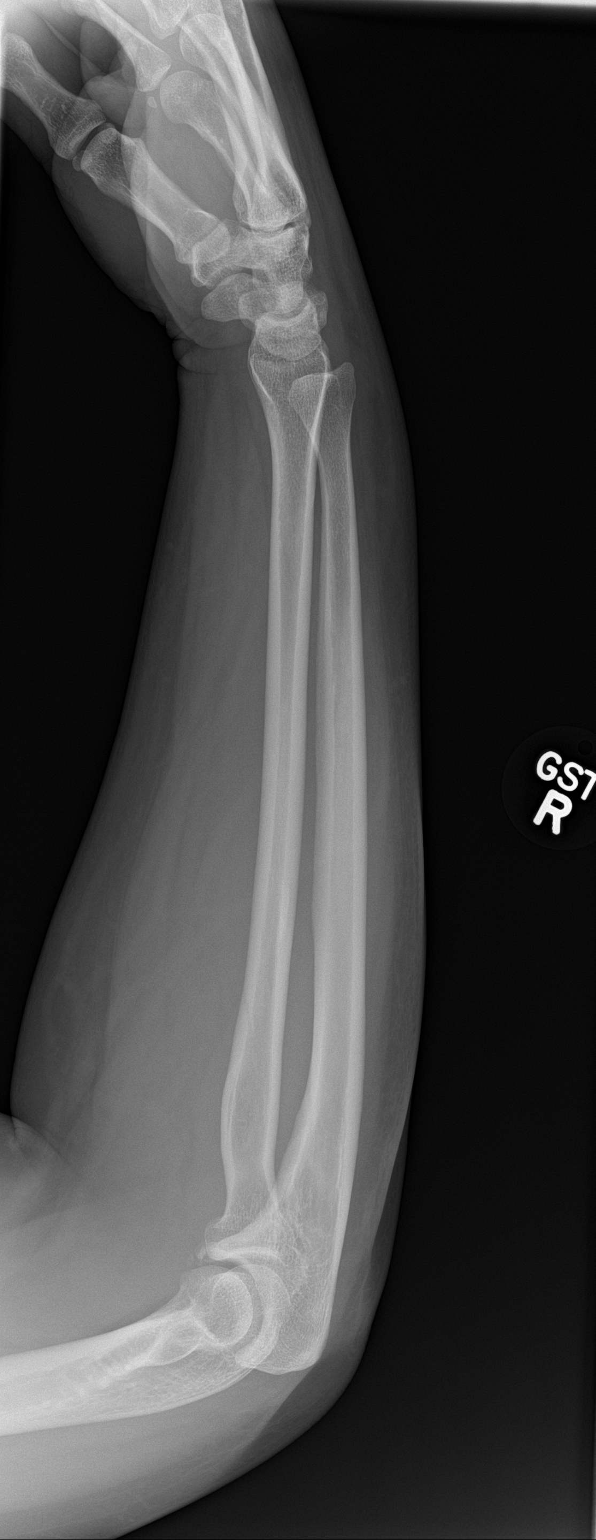

[2 of 2 positions shown; findings below may reference images not displayed]

FINDINGS: No acute fracture, subluxation or dislocation identified.

Mild degenerative changes at the humeral ulnar joint noted.

No radiopaque foreign bodies identified.
IMPRESSION: No acute bony abnormality.

## 2014-05-14 IMAGING — CR DG ELBOW COMPLETE 3+V*R*
4 series · 4 of 4 positions shown · non-contrast
Comparison: None.

CLINICAL DATA: Fall with elbow pain.  Initial encounter.

EXAM:
RIGHT ELBOW - COMPLETE 3+ VIEW

[elbow ap]
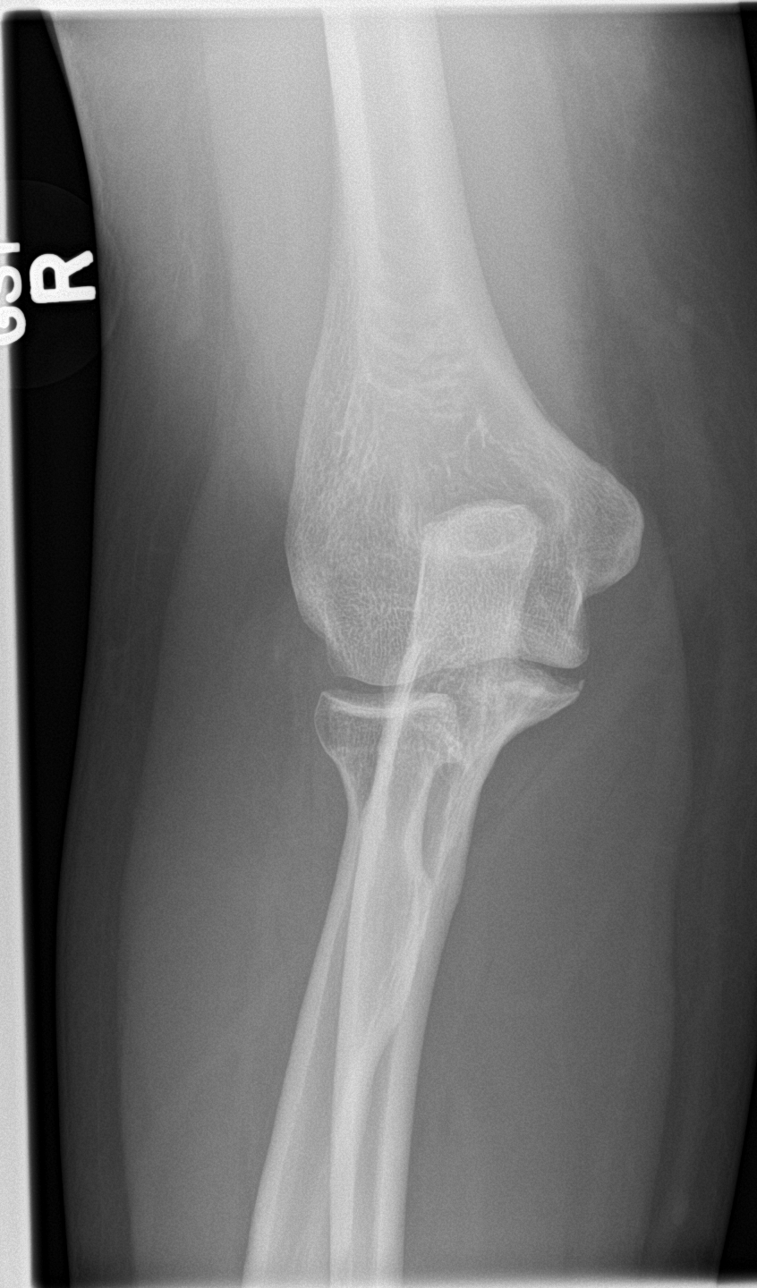

[elbow obl (1 of 2)]
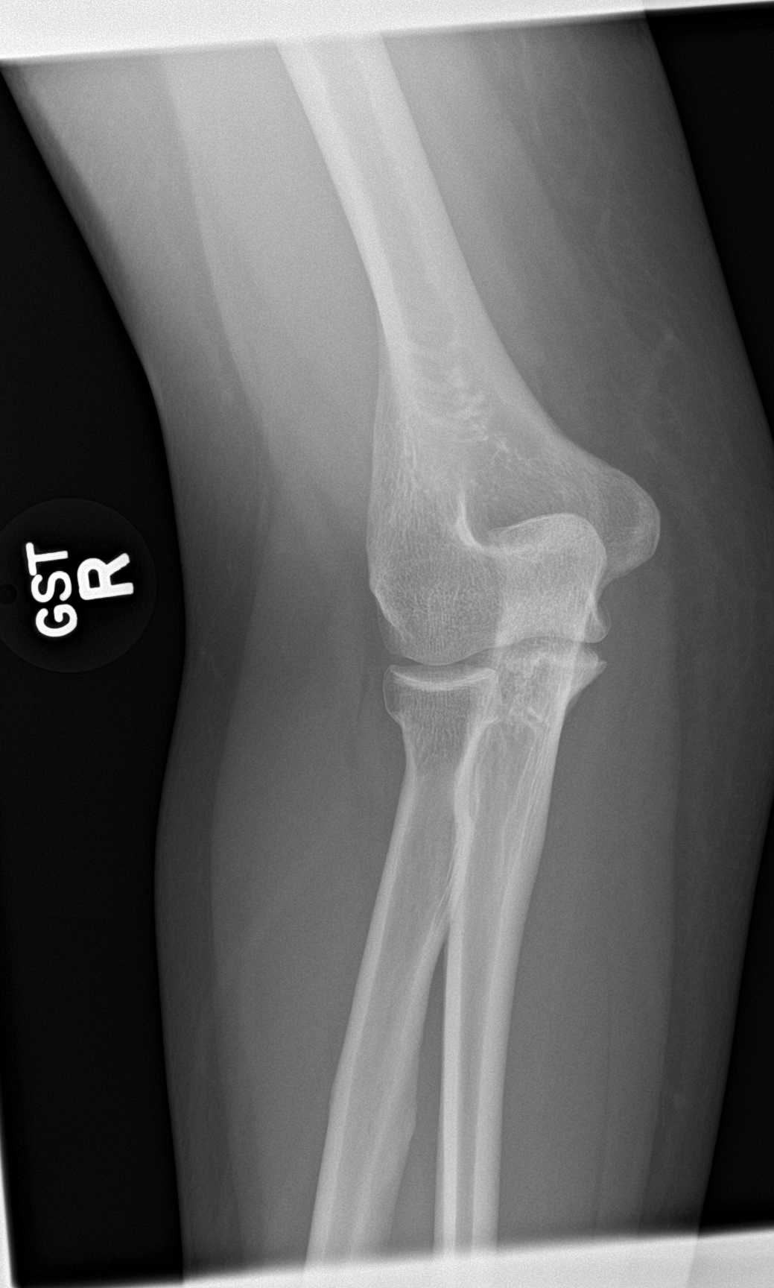

[elbow obl (2 of 2)]
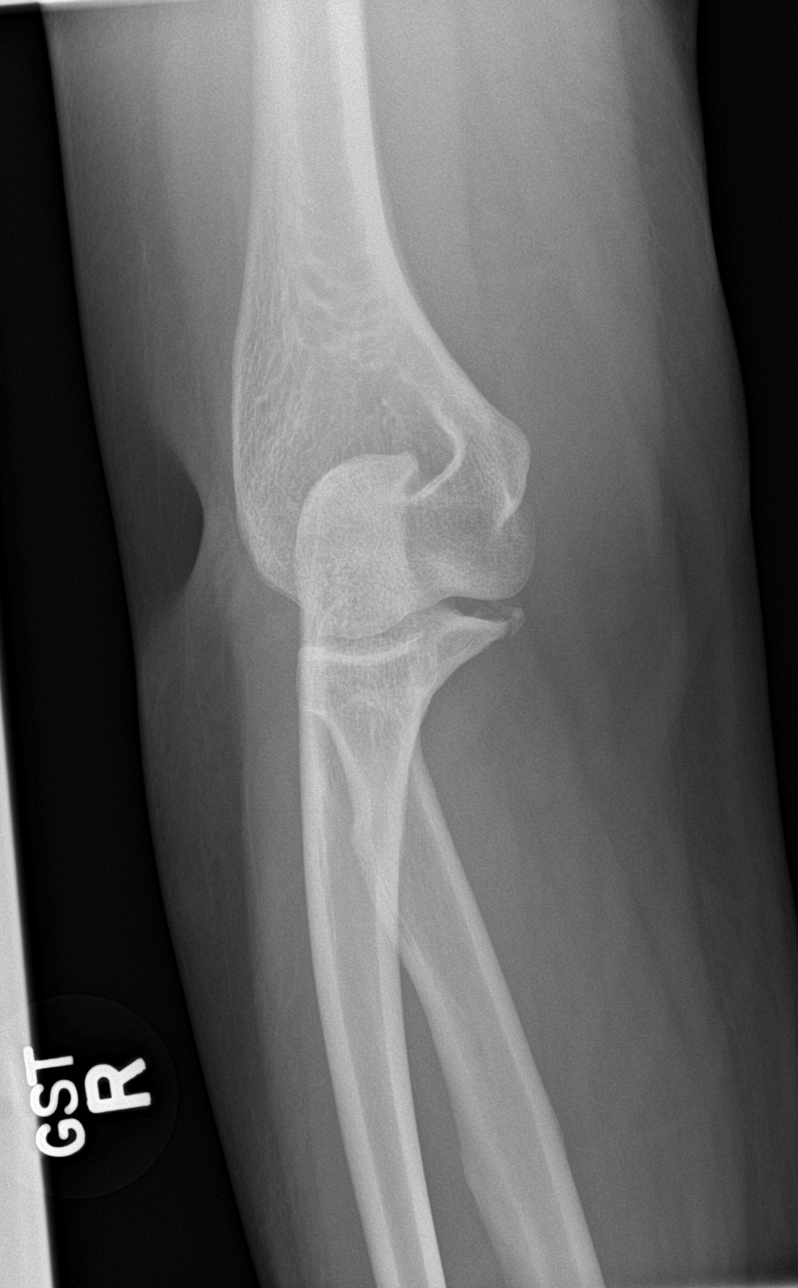

[elbow lat]
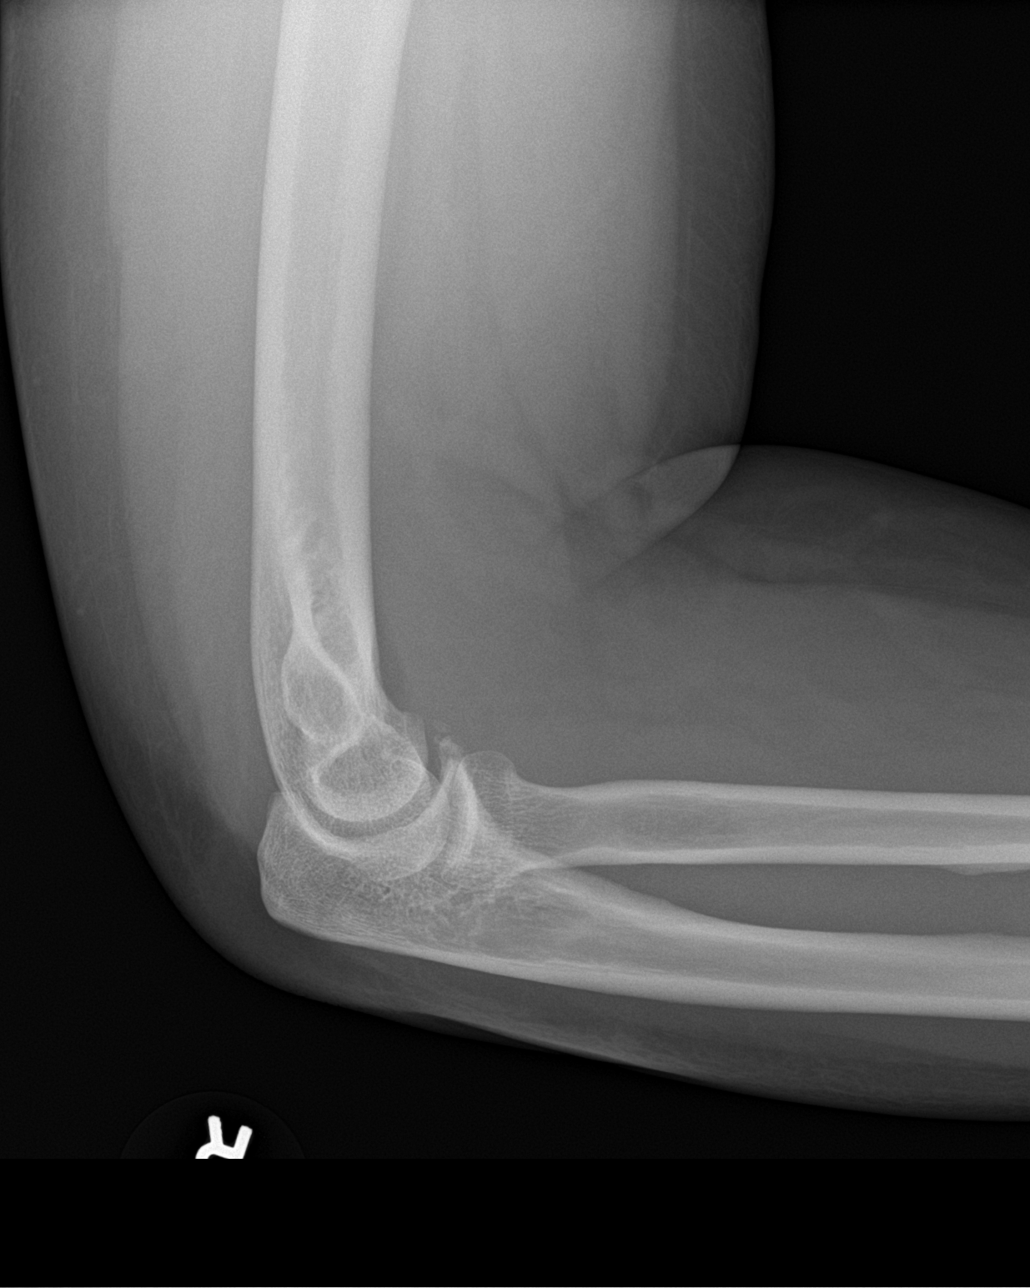

[4 of 4 positions shown; findings below may reference images not displayed]

FINDINGS: Mild irregularity of the coronoid process is identified, appears
remote and likely degenerative.

There is no evidence of joint effusion.

There is no evidence of acute fracture, subluxation or dislocation
identified.
IMPRESSION: No evidence of acute abnormality.

## 2014-05-14 MED ORDER — IBUPROFEN 800 MG PO TABS
800.0000 mg | ORAL_TABLET | Freq: Once | ORAL | Status: AC
  Administered 2014-05-15: 800 mg via ORAL
  Filled 2014-05-14: qty 1

## 2014-05-14 MED ORDER — OXYCODONE-ACETAMINOPHEN 5-325 MG PO TABS
2.0000 | ORAL_TABLET | Freq: Once | ORAL | Status: AC
  Administered 2014-05-15: 2 via ORAL
  Filled 2014-05-14: qty 2

## 2014-05-14 NOTE — ED Notes (Signed)
Pt states she slipped and fell down approx 6-7 steps at 5pm today.  Denies LOC.  C/o pain R forearm and lower back. Denies neck pain.

## 2014-05-15 MED ORDER — IBUPROFEN 800 MG PO TABS: 800.0000 mg | ORAL_TABLET | Freq: Four times a day (QID) | ORAL | Status: DC | PRN

## 2014-05-15 MED ORDER — OXYCODONE-ACETAMINOPHEN 5-325 MG PO TABS: 2.0000 | ORAL_TABLET | Freq: Four times a day (QID) | ORAL | Status: DC | PRN
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
## 2014-05-15 NOTE — Discharge Instructions (Signed)
Your x-rays did not show any broken bones.  Take medication as needed for pain.  Use ice for the first 24 hours, then heating pad as needed for muscle pain.  Return to the emergency department for worsening pain, loss of bowel or bladder control, or weakness.    Back Exercises Back exercises help treat and prevent back injuries. The goal is to increase your strength in your belly (abdominal) and back muscles. These exercises can also help with flexibility. Start these exercises when told by your doctor. HOME CARE Back exercises include: Pelvic Tilt.  Lie on your back with your knees bent. Tilt your pelvis until the lower part of your back is against the floor. Hold this position 5 to 10 sec. Repeat this exercise 5 to 10 times. Knee to Chest.  Pull 1 knee up against your chest and hold for 20 to 30 seconds. Repeat this with the other knee. This may be done with the other leg straight or bent, whichever feels better. Then, pull both knees up against your chest. Sit-Ups or Curl-Ups.  Bend your knees 90 degrees. Start with tilting your pelvis, and do a partial, slow sit-up. Only lift your upper half 30 to 45 degrees off the floor. Take at least 2 to 3 seonds for each sit-up. Do not do sit-ups with your knees out straight. If partial sit-ups are difficult, simply do the above but with only tightening your belly (abdominal) muscles and holding it as told. Hip-Lift.  Lie on your back with your knees flexed 90 degrees. Push down with your feet and shoulders as you raise your hips 2 inches off the floor. Hold for 10 seconds, repeat 5 to 10 times. Back Arches.  Lie on your stomach. Prop yourself up on bent elbows. Slowly press on your hands, causing an arch in your low back. Repeat 3 to 5 times. Shoulder-Lifts.  Lie face down with arms beside your body. Keep hips and belly pressed to floor as you slowly lift your head and shoulders off the floor. Do not overdo your exercises. Be careful in the  beginning. Exercises may cause you some mild back discomfort. If the pain lasts for more than 15 minutes, stop the exercises until you see your doctor. Improvement with exercise for back problems is slow.  Document Released: 07/05/2010 Document Revised: 08/25/2011 Document Reviewed: 04/03/2011 [facility] Patient Information 2015 [place], [place]. This information is not intended to replace advice given to you by your health care provider. Make sure you discuss any questions you have with your health care provider.  Back Pain, Adult Low back pain is very common. About 1 in 5 people have back pain.The cause of low back pain is rarely dangerous. The pain often gets better over time.About half of people with a sudden onset of back pain feel better in just 2 weeks. About 8 in 10 people feel better by 6 weeks.  CAUSES Some common causes of back pain include:  Strain of the muscles or ligaments supporting the spine.  Wear and tear (degeneration) of the spinal discs.  Arthritis.  Direct injury to the back. DIAGNOSIS Most of the time, the direct cause of low back pain is not known.However, back pain can be treated effectively even when the exact cause of the pain is unknown.Answering your caregiver's questions about your overall health and symptoms is one of the most accurate ways to make sure the cause of your pain is not dangerous. If your caregiver needs more information, he or she  may order lab work or imaging tests (X-rays or MRIs).However, even if imaging tests show changes in your back, this usually does not require surgery. HOME CARE INSTRUCTIONS For many people, back pain returns.Since low back pain is rarely dangerous, it is often a condition that people can learn to [facility] their own.   Remain active. It is stressful on the back to sit or stand in one place. Do not sit, drive, or stand in one place for more than 30 minutes at a time. Take short walks on level surfaces as soon as pain  allows.Try to increase the length of time you walk each day.  Do not stay in bed.Resting more than 1 or 2 days can delay your recovery.  Do not avoid exercise or work.Your body is made to move.It is not dangerous to be active, even though your back may hurt.Your back will likely heal faster if you return to being active before your pain is gone.  Pay attention to your body when you bend and lift. Many people have less discomfortwhen lifting if they bend their knees, keep the load close to their bodies,and avoid twisting. Often, the most comfortable positions are those that put less stress on your recovering back.  Find a comfortable position to sleep. Use a firm mattress and lie on your side with your knees slightly bent. If you lie on your back, put a pillow under your knees.  Only take over-the-counter or prescription medicines as directed by your caregiver. Over-the-counter medicines to reduce pain and inflammation are often the most helpful.Your caregiver may prescribe muscle relaxant drugs.These medicines help dull your pain so you can more quickly return to your normal activities and healthy exercise.  Put ice on the injured area.  Put ice in a plastic bag.  Place a towel between your skin and the bag.  Leave the ice on for 15-20 minutes, 03-04 times a day for the first 2 to 3 days. After that, ice and heat may be alternated to reduce pain and spasms.  Ask your caregiver about trying back exercises and gentle massage. This may be of some benefit.  Avoid feeling anxious or stressed.Stress increases muscle tension and can worsen back pain.It is important to recognize when you are anxious or stressed and learn ways to manage it.Exercise is a great option. SEEK MEDICAL CARE IF:  You have pain that is not relieved with rest or medicine.  You have pain that does not improve in 1 week.  You have new symptoms.  You are generally not feeling well. SEEK IMMEDIATE MEDICAL CARE  IF:   You have pain that radiates from your back into your legs.  You develop new bowel or bladder control problems.  You have unusual weakness or numbness in your arms or legs.  You develop nausea or vomiting.  You develop abdominal pain.  You feel faint. Document Released: 06/02/2005 Document Revised: 12/02/2011 Document Reviewed: 10/04/2013 [facility] Patient Information 2015 [place], [place]. This information is not intended to replace advice given to you by your health care provider. Make sure you discuss any questions you have with your health care provider.  Cryotherapy Cryotherapy is when you put ice on your injury. Ice helps lessen pain and puffiness (swelling) after an injury. Ice works the best when you start using it in the first 24 to 48 hours after an injury. HOME CARE  Put a dry or damp towel between the ice pack and your skin.  You may press gently on the  ice pack.  Leave the ice on for no more than 10 to 20 minutes at a time.  Check your skin after 5 minutes to make sure your skin is okay.  Rest at least 20 minutes between ice pack uses.  Stop using ice when your skin loses feeling (numbness).  Do not use ice on someone who cannot tell you when it hurts. This includes small children and people with memory problems (dementia). GET HELP RIGHT AWAY IF:  You have white spots on your skin.  Your skin turns blue or pale.  Your skin feels waxy or hard.  Your puffiness gets worse. MAKE SURE YOU:   Understand these instructions.  Will watch your condition.  Will get help right away if you are not doing well or get worse. Document Released: 11/19/2007 Document Revised: 08/25/2011 Document Reviewed: 01/23/2011 [facility] Patient Information 2015 [place], [place]. This information is not intended to replace advice given to you by your health care provider. Make sure you discuss any questions you have with your health care provider.  Fall Prevention and Home  Safety Falls cause injuries and can affect all age groups. It is possible to use preventive measures to significantly decrease the likelihood of falls. There are many simple measures which can make your home safer and prevent falls. OUTDOORS  Repair cracks and edges of walkways and driveways.  Remove high doorway thresholds.  Trim shrubbery on the main path into your home.  Have good outside lighting.  Clear walkways of tools, rocks, debris, and clutter.  Check that handrails are not broken and are securely fastened. Both sides of steps should have handrails.  Have leaves, snow, and ice cleared regularly.  Use sand or salt on walkways during winter months.  In the garage, clean up grease or oil spills. BATHROOM  Install night lights.  Install grab bars by the toilet and in the tub and shower.  Use non-skid mats or decals in the tub or shower.  Place a plastic non-slip stool in the shower to sit on, if needed.  Keep floors dry and clean up all water on the floor immediately.  Remove soap buildup in the tub or shower on a regular basis.  Secure bath mats with non-slip, double-sided rug tape.  Remove throw rugs and tripping hazards from the floors. BEDROOMS  Install night lights.  Make sure a bedside light is easy to reach.  Do not use oversized bedding.  Keep a telephone by your bedside.  Have a firm chair with side arms to use for getting dressed.  Remove throw rugs and tripping hazards from the floor. KITCHEN  Keep handles on pots and pans turned toward the center of the stove. Use back burners when possible.  Clean up spills quickly and allow time for drying.  Avoid walking on wet floors.  Avoid hot utensils and knives.  Position shelves so they are not too high or low.  Place commonly used objects within easy reach.  If necessary, use a sturdy step stool with a grab bar when reaching.  Keep electrical cables out of the way.  Do not use floor  polish or wax that makes floors slippery. If you must use wax, use non-skid floor wax.  Remove throw rugs and tripping hazards from the floor. STAIRWAYS  Never leave objects on stairs.  Place handrails on both sides of stairways and use them. Fix any loose handrails. Make sure handrails on both sides of the stairways are as long as the stairs.  Check carpeting to make sure it is firmly attached along stairs. Make repairs to worn or loose carpet promptly.  Avoid placing throw rugs at the top or bottom of stairways, or properly secure the rug with carpet tape to prevent slippage. Get rid of throw rugs, if possible.  Have an electrician put in a light switch at the top and bottom of the stairs. OTHER FALL PREVENTION TIPS  Wear low-heel or rubber-soled shoes that are supportive and fit well. Wear closed toe shoes.  When using a stepladder, make sure it is fully opened and both spreaders are firmly locked. Do not climb a closed stepladder.  Add color or contrast paint or tape to grab bars and handrails in your home. Place contrasting color strips on first and last steps.  Learn and use mobility aids as needed. Install an electrical emergency response system.  Turn on lights to avoid dark areas. Replace light bulbs that burn out immediately. Get light switches that glow.  Arrange furniture to create clear pathways. Keep furniture in the same place.  Firmly attach carpet with non-skid or double-sided tape.  Eliminate uneven floor surfaces.  Select a carpet pattern that does not visually hide the edge of steps.  Be aware of all pets. OTHER HOME SAFETY TIPS  Set the water temperature for 120 F (48.8 C).  Keep emergency numbers on or near the telephone.  Keep smoke detectors on every level of the home and near sleeping areas. Document Released: 05/23/2002 Document Revised: 12/02/2011 Document Reviewed: 08/22/2011 [facility] Patient Information 2015 [place]. This information  is not intended to replace advice given to you by your health care provider. Make sure you discuss any questions you have with your health care provider.  Heat Therapy Heat therapy can help make painful, stiff muscles and joints feel better. Do not use heat on new injuries. Wait at least 48 hours after an injury to use heat. Do not use heat when you have aches or pains right after an activity. If you still have pain 3 hours after stopping the activity, then you may use heat. HOME CARE Wet heat pack  Soak a clean towel in warm water. Squeeze out the extra water.  Put the warm, wet towel in a plastic bag.  Place a thin, dry towel between your skin and the bag.  Put the heat pack on the area for 5 minutes, and check your skin. Your skin may be pink, but it should not be red.  Leave the heat pack on the area for 15 to 30 minutes.  Repeat this every 2 to 4 hours while awake. Do not use heat while you are sleeping. Warm water bath  Fill a tub with warm water.  Place the affected body part in the tub.  Soak the area for 20 to 40 minutes.  Repeat as needed. Hot water bottle  Fill the water bottle half full with hot water.  Press out the extra air. Close the cap tightly.  Place a dry towel between your skin and the bottle.  Put the bottle on the area for 5 minutes, and check your skin. Your skin may be pink, but it should not be red.  Leave the bottle on the area for 15 to 30 minutes.  Repeat this every 2 to 4 hours while awake. Electric heating pad  Place a dry towel between your skin and the heating pad.  Set the heating pad on low heat.  Put the heating pad on the  area for 10 minutes, and check your skin. Your skin may be pink, but it should not be red.  Leave the heating pad on the area for 20 to 40 minutes.  Repeat this every 2 to 4 hours while awake.  Do not lie on the heating pad.  Do not fall asleep while using the heating pad.  Do not use the heating pad near  water. GET HELP RIGHT AWAY IF:  You get blisters or red skin.  Your skin is puffy (swollen), or you lose feeling (numbness) in the affected area.  You have any new problems.  Your problems are getting worse.  You have any questions or concerns. If you have any problems, stop using heat therapy until you see your doctor. MAKE SURE YOU:  Understand these instructions.  Will watch your condition.  Will get help right away if you are not doing well or get worse. Document Released: 08/25/2011 Document Reviewed: 07/26/2013 [facility] Patient Information 2015 [place], [place]. This information is not intended to replace advice given to you by your health care provider. Make sure you discuss any questions you have with your health care provider.  Musculoskeletal Pain Musculoskeletal pain is muscle and boney aches and pains. These pains can occur in any part of the body. Your caregiver may treat you without knowing the cause of the pain. They may treat you if blood or urine tests, X-rays, and other tests were normal.  CAUSES There is often not a definite cause or reason for these pains. These pains may be caused by a type of germ (virus). The discomfort may also come from overuse. Overuse includes working out too hard when your body is not fit. Boney aches also come from weather changes. Bone is sensitive to atmospheric pressure changes. HOME CARE INSTRUCTIONS   Ask when your test results will be ready. Make sure you get your test results.  Only take over-the-counter or prescription medicines for pain, discomfort, or fever as directed by your caregiver. If you were given medications for your condition, do not drive, operate machinery or power tools, or sign legal documents for 24 hours. Do not drink alcohol. Do not take sleeping pills or other medications that may interfere with treatment.  Continue all activities unless the activities cause more pain. When the pain lessens, slowly resume normal  activities. Gradually increase the intensity and duration of the activities or exercise.  During periods of severe pain, bed rest may be helpful. Lay or sit in any position that is comfortable.  Putting ice on the injured area.  Put ice in a bag.  Place a towel between your skin and the bag.  Leave the ice on for 15 to 20 minutes, 3 to 4 times a day.  Follow up with your caregiver for continued problems and no reason can be found for the pain. If the pain becomes worse or does not go away, it may be necessary to repeat tests or do additional testing. Your caregiver may need to look further for a possible cause. SEEK IMMEDIATE MEDICAL CARE IF:  You have pain that is getting worse and is not relieved by medications.  You develop chest pain that is associated with shortness or breath, sweating, feeling sick to your stomach (nauseous), or throw up (vomit).  Your pain becomes localized to the abdomen.  You develop any new symptoms that seem different or that concern you. MAKE SURE YOU:   Understand these instructions.  Will watch your condition.  Will get  help right away if you are not doing well or get worse. Document Released: 06/02/2005 Document Revised: 08/25/2011 Document Reviewed: 02/04/2013 [facility] Patient Information 2015 [place], [place]. This information is not intended to replace advice given to you by your health care provider. Make sure you discuss any questions you have with your health care provider.

## 2014-05-15 NOTE — ED Notes (Signed)
Pt. Refused wheelchair 

## 2014-10-06 ENCOUNTER — Emergency Department (HOSPITAL_COMMUNITY): Payer: Medicaid Other

## 2014-10-06 ENCOUNTER — Encounter (HOSPITAL_COMMUNITY): Payer: Self-pay

## 2014-10-06 ENCOUNTER — Emergency Department (HOSPITAL_COMMUNITY)
Admission: EM | Admit: 2014-10-06 | Discharge: 2014-10-06 | Disposition: A | Discharge status: Home / Self Care | Payer: Medicaid Other | Attending: Emergency Medicine | Admitting: Emergency Medicine

## 2014-10-06 DIAGNOSIS — Z91040 Latex allergy status: Secondary | ICD-10-CM | POA: Insufficient documentation

## 2014-10-06 DIAGNOSIS — S8001XA Contusion of right knee, initial encounter: Secondary | ICD-10-CM | POA: Diagnosis not present

## 2014-10-06 DIAGNOSIS — Y9389 Activity, other specified: Secondary | ICD-10-CM | POA: Diagnosis not present

## 2014-10-06 DIAGNOSIS — S1093XA Contusion of unspecified part of neck, initial encounter: Secondary | ICD-10-CM | POA: Insufficient documentation

## 2014-10-06 DIAGNOSIS — Z79899 Other long term (current) drug therapy: Secondary | ICD-10-CM | POA: Insufficient documentation

## 2014-10-06 DIAGNOSIS — Y9289 Other specified places as the place of occurrence of the external cause: Secondary | ICD-10-CM | POA: Insufficient documentation

## 2014-10-06 DIAGNOSIS — M25561 Pain in right knee: Secondary | ICD-10-CM

## 2014-10-06 DIAGNOSIS — Y998 Other external cause status: Secondary | ICD-10-CM | POA: Diagnosis not present

## 2014-10-06 DIAGNOSIS — Z720 Tobacco use: Secondary | ICD-10-CM | POA: Diagnosis not present

## 2014-10-06 DIAGNOSIS — Z3202 Encounter for pregnancy test, result negative: Secondary | ICD-10-CM | POA: Diagnosis not present

## 2014-10-06 DIAGNOSIS — S0003XA Contusion of scalp, initial encounter: Secondary | ICD-10-CM | POA: Diagnosis not present

## 2014-10-06 DIAGNOSIS — S0990XA Unspecified injury of head, initial encounter: Secondary | ICD-10-CM | POA: Diagnosis present

## 2014-10-06 DIAGNOSIS — Z8632 Personal history of gestational diabetes: Secondary | ICD-10-CM | POA: Insufficient documentation

## 2014-10-06 DIAGNOSIS — S0083XA Contusion of other part of head, initial encounter: Secondary | ICD-10-CM

## 2014-10-06 LAB — URINALYSIS, ROUTINE W REFLEX MICROSCOPIC
Bilirubin Urine: NEGATIVE
Glucose, UA: NEGATIVE mg/dL
KETONES UR: 15 mg/dL — AB
Nitrite: NEGATIVE
Specific Gravity, Urine: 1.025 (ref 1.005–1.030)
Urobilinogen, UA: 0.2 mg/dL (ref 0.0–1.0)
pH: 5.5 (ref 5.0–8.0)

## 2014-10-06 LAB — URINE MICROSCOPIC-ADD ON

## 2014-10-06 LAB — BASIC METABOLIC PANEL
Anion gap: 9 (ref 5–15)
BUN: 7 mg/dL (ref 6–23)
CALCIUM: 9.6 mg/dL (ref 8.4–10.5)
CO2: 27 mmol/L (ref 19–32)
CREATININE: 1.1 mg/dL (ref 0.50–1.10)
Chloride: 104 mmol/L (ref 96–112)
GFR calc Af Amer: 72 mL/min — ABNORMAL LOW (ref >90)
GFR calc non Af Amer: 62 mL/min — ABNORMAL LOW (ref >90)
Glucose, Bld: 130 mg/dL — ABNORMAL HIGH (ref 70–99)
Potassium: 3.3 mmol/L — ABNORMAL LOW (ref 3.5–5.1)
SODIUM: 140 mmol/L (ref 135–145)

## 2014-10-06 LAB — RAPID URINE DRUG SCREEN, HOSP PERFORMED
Amphetamines: NOT DETECTED
BENZODIAZEPINES: NOT DETECTED
Barbiturates: NOT DETECTED
COCAINE: POSITIVE — AB
OPIATES: NOT DETECTED
Tetrahydrocannabinol: POSITIVE — AB

## 2014-10-06 LAB — PREGNANCY, URINE: Preg Test, Ur: NEGATIVE

## 2014-10-06 LAB — CBC
HEMATOCRIT: 36.3 % (ref 36.0–46.0)
HEMOGLOBIN: 11.5 g/dL — AB (ref 12.0–15.0)
MCH: 26.3 pg (ref 26.0–34.0)
MCHC: 31.7 g/dL (ref 30.0–36.0)
MCV: 82.9 fL (ref 78.0–100.0)
Platelets: 253 10*3/uL (ref 150–400)
RBC: 4.38 MIL/uL (ref 3.87–5.11)
RDW: 16 % — ABNORMAL HIGH (ref 11.5–15.5)
WBC: 7.9 10*3/uL (ref 4.0–10.5)

## 2014-10-06 LAB — ETHANOL
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
## 2014-10-06 MED ORDER — NAPROXEN 500 MG PO TABS: 500.0000 mg | ORAL_TABLET | Freq: Two times a day (BID) | ORAL | Status: DC

## 2014-10-06 MED ORDER — POTASSIUM CHLORIDE CRYS ER 20 MEQ PO TBCR
40.0000 meq | EXTENDED_RELEASE_TABLET | Freq: Once | ORAL | Status: AC
  Administered 2014-10-06: 40 meq via ORAL
  Filled 2014-10-06: qty 2

## 2014-10-06 MED ORDER — ACETAMINOPHEN 325 MG PO TABS
650.0000 mg | ORAL_TABLET | Freq: Once | ORAL | Status: AC
  Administered 2014-10-06: 650 mg via ORAL
  Filled 2014-10-06: qty 2

## 2014-10-06 MED ORDER — CYCLOBENZAPRINE HCL 5 MG PO TABS: 5.0000 mg | ORAL_TABLET | Freq: Three times a day (TID) | ORAL | Status: DC | PRN

## 2014-10-06 NOTE — ED Notes (Signed)
Requested urine and placed on bedpan.

## 2014-10-06 NOTE — ED Notes (Signed)
Vista collar applied.

## 2014-10-06 NOTE — ED Notes (Signed)
Dr Linker in room 

## 2014-10-06 NOTE — ED Notes (Signed)
Patient transported to CT 
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
## 2014-10-06 NOTE — Discharge Instructions (Signed)
Return to the ED with any concerns including vomiting, seizure activity, weakness of arms or legs, fainting, difficulty breathing, decreased level of alertness/lethargy, or any other alarming symptoms

## 2014-10-08 LAB — URINE CULTURE

## 2014-10-10 ENCOUNTER — Emergency Department (HOSPITAL_COMMUNITY)
Admission: EM | Admit: 2014-10-10 | Discharge: 2014-10-11 | Disposition: A | Discharge status: Home / Self Care | Payer: Medicaid Other | Attending: Emergency Medicine | Admitting: Emergency Medicine

## 2014-10-10 ENCOUNTER — Encounter (HOSPITAL_COMMUNITY): Payer: Self-pay | Admitting: *Deleted

## 2014-10-10 DIAGNOSIS — F1910 Other psychoactive substance abuse, uncomplicated: Secondary | ICD-10-CM

## 2014-10-10 DIAGNOSIS — F1410 Cocaine abuse, uncomplicated: Secondary | ICD-10-CM | POA: Diagnosis not present

## 2014-10-10 DIAGNOSIS — F32A Depression, unspecified: Secondary | ICD-10-CM

## 2014-10-10 DIAGNOSIS — G479 Sleep disorder, unspecified: Secondary | ICD-10-CM | POA: Insufficient documentation

## 2014-10-10 DIAGNOSIS — Z720 Tobacco use: Secondary | ICD-10-CM | POA: Diagnosis not present

## 2014-10-10 DIAGNOSIS — F1210 Cannabis abuse, uncomplicated: Secondary | ICD-10-CM | POA: Diagnosis not present

## 2014-10-10 DIAGNOSIS — F329 Major depressive disorder, single episode, unspecified: Secondary | ICD-10-CM | POA: Insufficient documentation

## 2014-10-10 DIAGNOSIS — Z91040 Latex allergy status: Secondary | ICD-10-CM | POA: Diagnosis not present

## 2014-10-10 DIAGNOSIS — Z8632 Personal history of gestational diabetes: Secondary | ICD-10-CM | POA: Insufficient documentation

## 2014-10-10 DIAGNOSIS — Z791 Long term (current) use of non-steroidal anti-inflammatories (NSAID): Secondary | ICD-10-CM | POA: Diagnosis not present

## 2014-10-10 DIAGNOSIS — Z79899 Other long term (current) drug therapy: Secondary | ICD-10-CM | POA: Diagnosis not present

## 2014-10-10 LAB — CBC
HEMATOCRIT: 36.4 % (ref 36.0–46.0)
Hemoglobin: 11.2 g/dL — ABNORMAL LOW (ref 12.0–15.0)
MCH: 26 pg (ref 26.0–34.0)
MCHC: 30.8 g/dL (ref 30.0–36.0)
MCV: 84.7 fL (ref 78.0–100.0)
PLATELETS: 306 10*3/uL (ref 150–400)
RBC: 4.3 MIL/uL (ref 3.87–5.11)
RDW: 16 % — ABNORMAL HIGH (ref 11.5–15.5)
WBC: 6.5 10*3/uL (ref 4.0–10.5)

## 2014-10-10 LAB — ETHANOL: Alcohol, Ethyl (B): <5 mg/dL (ref 0–9)

## 2014-10-10 LAB — COMPREHENSIVE METABOLIC PANEL
ALT: 16 U/L (ref 0–35)
ANION GAP: 12 (ref 5–15)
AST: 21 U/L (ref 0–37)
Albumin: 3.6 g/dL (ref 3.5–5.2)
Alkaline Phosphatase: 67 U/L (ref 39–117)
BILIRUBIN TOTAL: 0.5 mg/dL (ref 0.3–1.2)
BUN: 7 mg/dL (ref 6–23)
CALCIUM: 8.7 mg/dL (ref 8.4–10.5)
CHLORIDE: 103 mmol/L (ref 96–112)
CO2: 23 mmol/L (ref 19–32)
CREATININE: 0.99 mg/dL (ref 0.50–1.10)
GFR calc Af Amer: 82 mL/min — ABNORMAL LOW (ref >90)
GFR, EST NON AFRICAN AMERICAN: 71 mL/min — AB (ref >90)
Glucose, Bld: 130 mg/dL — ABNORMAL HIGH (ref 70–99)
Potassium: 3.3 mmol/L — ABNORMAL LOW (ref 3.5–5.1)
SODIUM: 138 mmol/L (ref 135–145)
Total Protein: 7.2 g/dL (ref 6.0–8.3)

## 2014-10-10 LAB — RAPID URINE DRUG SCREEN, HOSP PERFORMED
Amphetamines: NOT DETECTED
BENZODIAZEPINES: NOT DETECTED
Barbiturates: NOT DETECTED
Cocaine: POSITIVE — AB
Opiates: NOT DETECTED
TETRAHYDROCANNABINOL: POSITIVE — AB

## 2014-10-10 LAB — SALICYLATE LEVEL: Salicylate Lvl: <4 mg/dL (ref 2.8–20.0)

## 2014-10-10 LAB — ACETAMINOPHEN LEVEL: Acetaminophen (Tylenol), Serum: <10 ug/mL — ABNORMAL LOW (ref 10–30)

## 2014-10-10 MED ORDER — ONDANSETRON HCL 4 MG PO TABS: 4.0000 mg | ORAL_TABLET | Freq: Three times a day (TID) | ORAL | Status: DC | PRN

## 2014-10-10 MED ORDER — LORAZEPAM 1 MG PO TABS: 1.0000 mg | ORAL_TABLET | Freq: Three times a day (TID) | ORAL | Status: DC | PRN

## 2014-10-10 MED ORDER — ACETAMINOPHEN 325 MG PO TABS
650.0000 mg | ORAL_TABLET | ORAL | Status: DC | PRN
  Administered 2014-10-10: 650 mg via ORAL
  Filled 2014-10-10: qty 2

## 2014-10-10 NOTE — ED Notes (Signed)
Pt denies SI/HI at this time.  

## 2014-10-10 NOTE — ED Notes (Signed)
TTS monitor placed at bedside 
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
## 2014-10-10 NOTE — ED Notes (Signed)
MD at bedside. 

## 2014-10-10 NOTE — ED Notes (Signed)
Patient placed in maroon scrubs, belongings at nurses station
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
## 2014-10-10 NOTE — ED Notes (Signed)
Pt c/o headache, states she takes tylenol/motrin at home. Dr. Rhunette Croft made aware.

## 2014-10-10 NOTE — ED Notes (Addendum)
Explained to patient about TTS evaluation, and visitation rules when she is moved to [facility]. Pt verbalized understanding of both.
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
## 2014-10-11 NOTE — ED Notes (Signed)
Pt denies SI/HI at this time.  

## 2014-10-11 NOTE — Discharge Instructions (Signed)
We saw you in the ER for your mental health concerns and had our behavioral health team evaluate you. The team feels comfortable sending you home, please follow the recommendations given to you by them and the follow-up and medications they have prescribed to you. Please refrain from substance abuse. Please see the [facility] doctors tomorrow, to see if you can be restarted on your meds. Return to the ER if your symptoms worsen.   Depression Depression refers to feeling sad, low, down in the dumps, blue, gloomy, or empty. In general, there are two kinds of depression: 1. Normal sadness or normal grief. This kind of depression is one that we all feel from time to time after upsetting life experiences, such as the loss of a job or the ending of a relationship. This kind of depression is considered normal, is short lived, and resolves within a few days to 2 weeks. Depression experienced after the loss of a loved one (bereavement) often lasts longer than 2 weeks but normally gets better with time. 2. Clinical depression. This kind of depression lasts longer than normal sadness or normal grief or interferes with your ability to function at home, at work, and in school. It also interferes with your personal relationships. It affects almost every aspect of your life. Clinical depression is an illness. Symptoms of depression can also be caused by conditions other than those mentioned above, such as:  Physical illness. Some physical illnesses, including underactive thyroid gland (hypothyroidism), severe anemia, specific types of cancer, diabetes, uncontrolled seizures, heart and lung problems, strokes, and chronic pain are commonly associated with symptoms of depression.  Side effects of some prescription medicine. In some people, certain types of medicine can cause symptoms of depression.  Substance abuse. Abuse of alcohol and illicit drugs can cause symptoms of depression. SYMPTOMS Symptoms of normal sadness  and normal grief include the following:  Feeling sad or crying for short periods of time.  Not caring about anything (apathy).  Difficulty sleeping or sleeping too much.  No longer able to enjoy the things you used to enjoy.  Desire to be by oneself all the time (social isolation).  Lack of energy or motivation.  Difficulty concentrating or remembering.  Change in appetite or weight.  Restlessness or agitation. Symptoms of clinical depression include the same symptoms of normal sadness or normal grief and also the following symptoms:  Feeling sad or crying all the time.  Feelings of guilt or worthlessness.  Feelings of hopelessness or helplessness.  Thoughts of suicide or the desire to harm yourself (suicidal ideation).  Loss of touch with reality (psychotic symptoms). Seeing or hearing things that are not real (hallucinations) or having false beliefs about your life or the people around you (delusions and paranoia). DIAGNOSIS  The diagnosis of clinical depression is usually based on how bad the symptoms are and how long they have lasted. Your health care provider will also ask you questions about your medical history and substance use to find out if physical illness, use of prescription medicine, or substance abuse is causing your depression. Your health care provider may also order blood tests. TREATMENT  Often, normal sadness and normal grief do not require treatment. However, sometimes antidepressant medicine is given for bereavement to ease the depressive symptoms until they resolve. The treatment for clinical depression depends on how bad the symptoms are but often includes antidepressant medicine, counseling with a mental health professional, or both. Your health care provider will help to determine what treatment is  best for you. Depression caused by physical illness usually goes away with appropriate medical treatment of the illness. If prescription medicine is causing  depression, talk with your health care provider about stopping the medicine, decreasing the dose, or changing to another medicine. Depression caused by the abuse of alcohol or illicit drugs goes away when you stop using these substances. Some adults need professional help in order to stop drinking or using drugs. SEEK IMMEDIATE MEDICAL CARE IF:  You have thoughts about hurting yourself or others.  You lose touch with reality (have psychotic symptoms).  You are taking medicine for depression and have a serious side effect. FOR MORE INFORMATION  National Alliance on Mental Illness: www.nami.[employer] of Mental Health: http://www.maynard.net/ Document Released: 05/30/2000 Document Revised: 10/17/2013 Document Reviewed: 09/01/2011 [facility] Patient Information 2015 [place], [place]. This information is not intended to replace advice given to you by your health care provider. Make sure you discuss any questions you have with your health care provider.

## 2014-10-11 NOTE — BHH Counselor (Signed)
Per Donell Sievert, PA, pt does not meet inpt tx criteria and can be d/c with outpatient resources.  Outpatient resources were faxed to [facility] at 12:00am 10/11/14.   Cyndie Mull, [facility] Triage Specialist  [phone]

## 2014-10-11 NOTE — BHH Counselor (Addendum)
TTS Counselor informed attending RN [place] Constable) of disposition and verified that outpatient resources were received. Counselor also informed EDP, Dr Rhunette Croft, of disposition. Plan is for pt to go to [facility] in the morning to get re-started on psych medications.   Cyndie Mull, [facility] Triage Specialist

## 2014-12-14 ENCOUNTER — Emergency Department (HOSPITAL_COMMUNITY)
Admission: EM | Admit: 2014-12-14 | Discharge: 2014-12-14 | Discharge status: Against Medical Advice | Payer: Medicaid Other | Attending: Emergency Medicine | Admitting: Emergency Medicine

## 2014-12-14 ENCOUNTER — Encounter (HOSPITAL_COMMUNITY): Payer: Self-pay | Admitting: Emergency Medicine

## 2014-12-14 DIAGNOSIS — S3992XA Unspecified injury of lower back, initial encounter: Secondary | ICD-10-CM | POA: Diagnosis not present

## 2014-12-14 DIAGNOSIS — Y939 Activity, unspecified: Secondary | ICD-10-CM | POA: Diagnosis not present

## 2014-12-14 DIAGNOSIS — E119 Type 2 diabetes mellitus without complications: Secondary | ICD-10-CM | POA: Insufficient documentation

## 2014-12-14 DIAGNOSIS — Y999 Unspecified external cause status: Secondary | ICD-10-CM | POA: Insufficient documentation

## 2014-12-14 DIAGNOSIS — W2209XA Striking against other stationary object, initial encounter: Secondary | ICD-10-CM | POA: Insufficient documentation

## 2014-12-14 DIAGNOSIS — Y929 Unspecified place or not applicable: Secondary | ICD-10-CM | POA: Diagnosis not present

## 2014-12-14 DIAGNOSIS — Z720 Tobacco use: Secondary | ICD-10-CM | POA: Diagnosis not present
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
## 2014-12-14 NOTE — ED Notes (Signed)
Pt states that she needs to leave to go home to her child and will return in the morning. RN explained reason for delay and risks/benefits of leaving. Pt calm/cooperative, verbalized understanding, no further questions/concerns. Ambulatory w/ steady gait.

## 2015-01-22 ENCOUNTER — Encounter (HOSPITAL_COMMUNITY): Payer: Self-pay | Admitting: *Deleted

## 2015-01-22 ENCOUNTER — Emergency Department (HOSPITAL_COMMUNITY)
Admission: EM | Admit: 2015-01-22 | Discharge: 2015-01-22 | Disposition: A | Discharge status: Home / Self Care | Payer: Medicaid Other | Attending: Emergency Medicine | Admitting: Emergency Medicine

## 2015-01-22 ENCOUNTER — Emergency Department (HOSPITAL_COMMUNITY): Payer: Medicaid Other

## 2015-01-22 DIAGNOSIS — W109XXA Fall (on) (from) unspecified stairs and steps, initial encounter: Secondary | ICD-10-CM | POA: Diagnosis not present

## 2015-01-22 DIAGNOSIS — L02411 Cutaneous abscess of right axilla: Secondary | ICD-10-CM | POA: Diagnosis not present

## 2015-01-22 DIAGNOSIS — S39012A Strain of muscle, fascia and tendon of lower back, initial encounter: Secondary | ICD-10-CM | POA: Diagnosis not present

## 2015-01-22 DIAGNOSIS — Z8632 Personal history of gestational diabetes: Secondary | ICD-10-CM | POA: Insufficient documentation

## 2015-01-22 DIAGNOSIS — Z791 Long term (current) use of non-steroidal anti-inflammatories (NSAID): Secondary | ICD-10-CM | POA: Insufficient documentation

## 2015-01-22 DIAGNOSIS — Y939 Activity, unspecified: Secondary | ICD-10-CM | POA: Insufficient documentation

## 2015-01-22 DIAGNOSIS — Y929 Unspecified place or not applicable: Secondary | ICD-10-CM | POA: Insufficient documentation

## 2015-01-22 DIAGNOSIS — Z720 Tobacco use: Secondary | ICD-10-CM | POA: Diagnosis not present

## 2015-01-22 DIAGNOSIS — Y999 Unspecified external cause status: Secondary | ICD-10-CM | POA: Insufficient documentation

## 2015-01-22 DIAGNOSIS — S3992XA Unspecified injury of lower back, initial encounter: Secondary | ICD-10-CM | POA: Diagnosis present

## 2015-01-22 IMAGING — CR DG LUMBAR SPINE COMPLETE 4+V
5 series · 5 of 5 positions shown · non-contrast
Comparison: Lumbar spine radiographs performed [DATE]

CLINICAL DATA: Status post fall, with generalized lower back pain,
radiating to the right thigh. Initial encounter.

EXAM:
LUMBAR SPINE - COMPLETE 4+ VIEW

[l-spine ap]
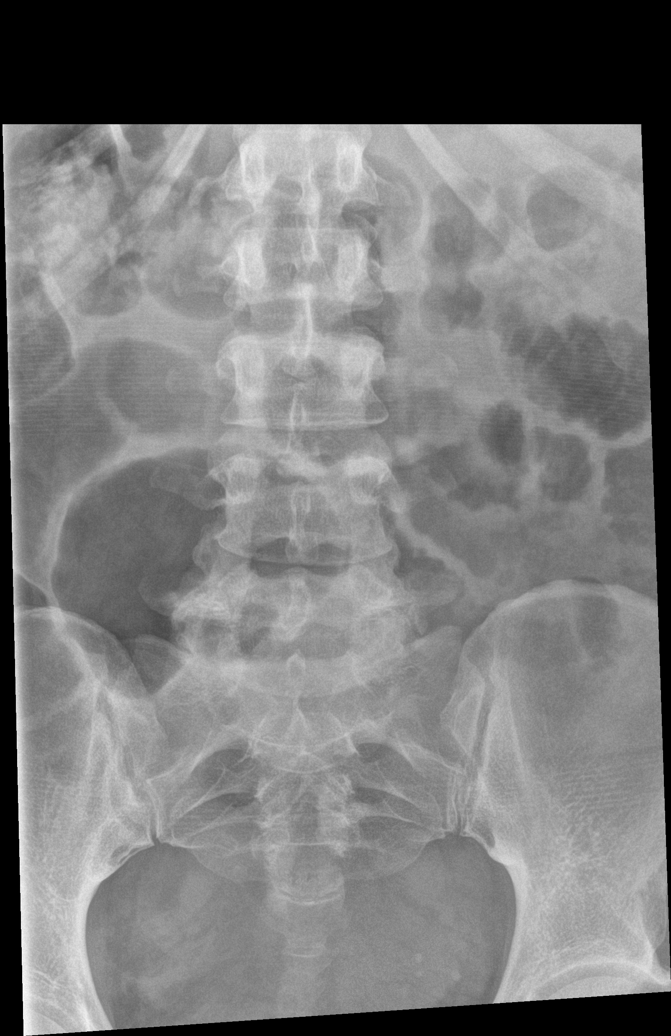

[l-spine obl (1 of 2)]
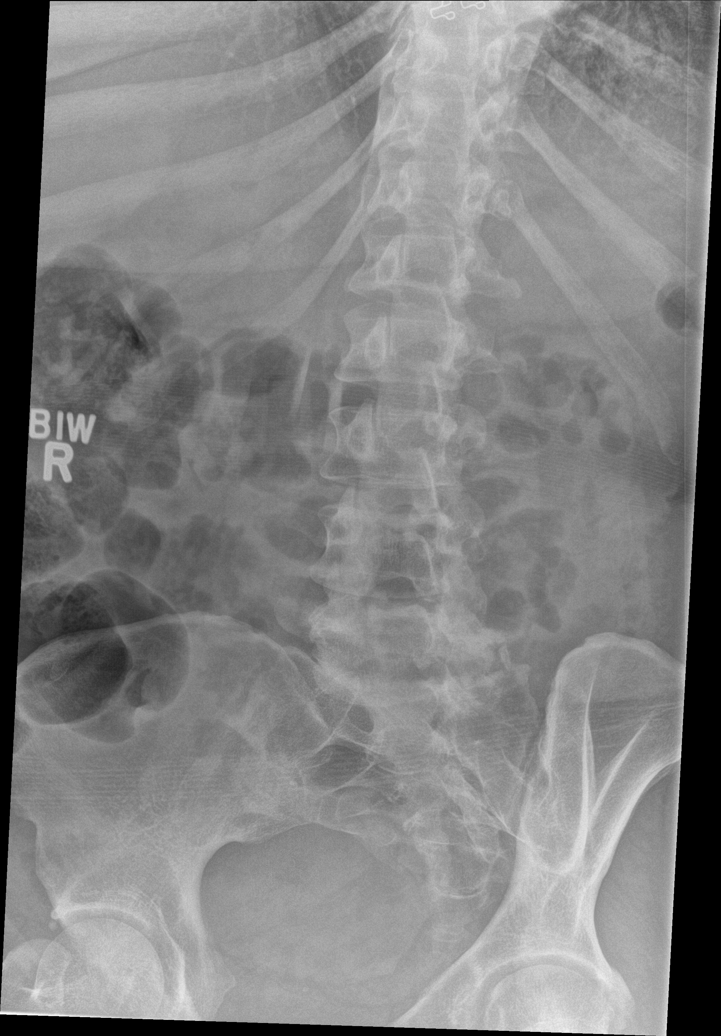

[l-spine obl (2 of 2)]
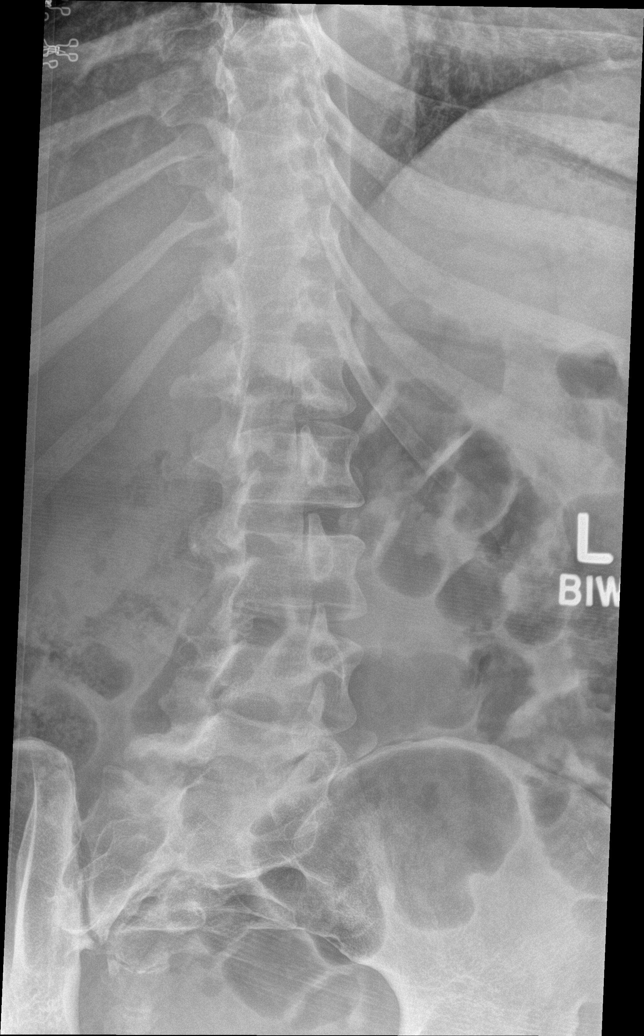

[l-spine lat]
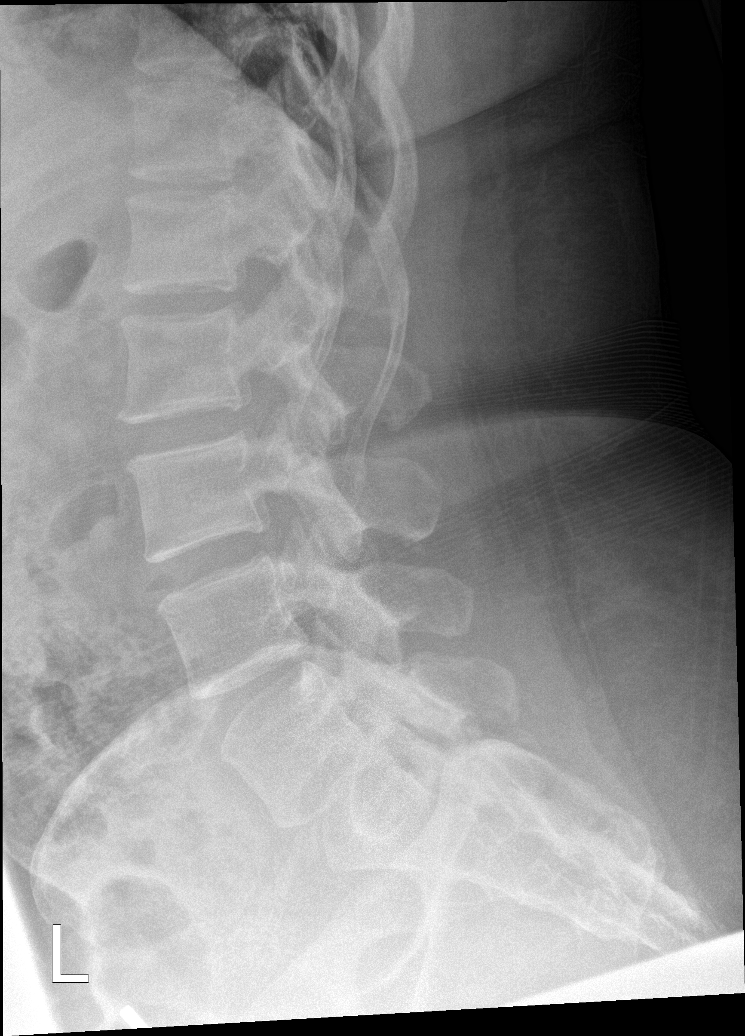

[l-spine spot]
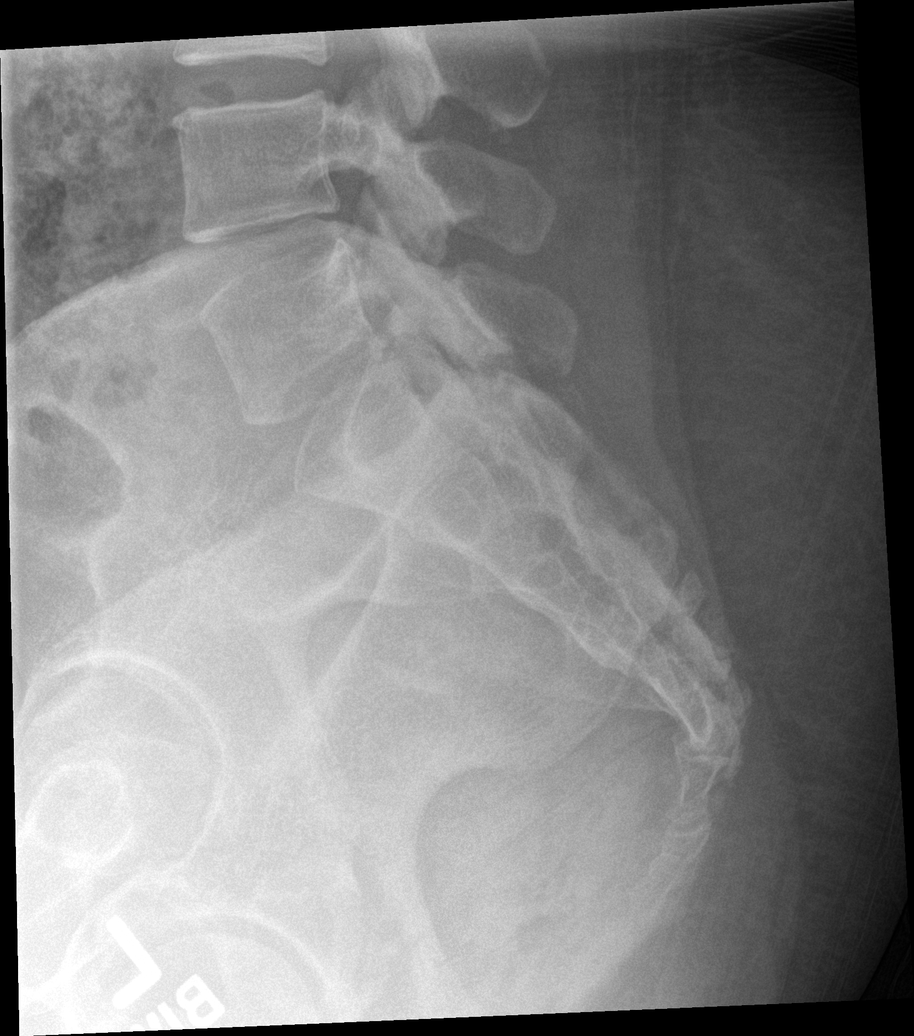

[5 of 5 positions shown; findings below may reference images not displayed]

FINDINGS: There is no evidence of fracture or subluxation. Vertebral bodies
demonstrate normal height and alignment. Intervertebral disc spaces
are preserved. The visualized neural foramina are grossly
unremarkable in appearance.

The visualized bowel gas pattern is unremarkable in appearance; air
and stool are noted within the colon. The sacroiliac joints are
within normal limits.
IMPRESSION: No evidence of fracture or subluxation along the lumbar spine.

## 2015-01-22 MED ORDER — KETOROLAC TROMETHAMINE 60 MG/2ML IM SOLN
60.0000 mg | Freq: Once | INTRAMUSCULAR | Status: AC
  Administered 2015-01-22: 60 mg via INTRAMUSCULAR
  Filled 2015-01-22: qty 2

## 2015-01-22 MED ORDER — HYDROCODONE-ACETAMINOPHEN 5-325 MG PO TABS
2.0000 | ORAL_TABLET | Freq: Once | ORAL | Status: DC
  Filled 2015-01-22: qty 2

## 2015-01-22 MED ORDER — SULFAMETHOXAZOLE-TRIMETHOPRIM 800-160 MG PO TABS: 1.0000 | ORAL_TABLET | Freq: Two times a day (BID) | ORAL | Status: AC

## 2015-01-22 MED ORDER — OXYCODONE-ACETAMINOPHEN 5-325 MG PO TABS
2.0000 | ORAL_TABLET | Freq: Once | ORAL | Status: AC
  Administered 2015-01-22: 2 via ORAL
  Filled 2015-01-22: qty 2

## 2015-01-22 MED ORDER — OXYCODONE-ACETAMINOPHEN 5-325 MG PO TABS: 1.0000 | ORAL_TABLET | Freq: Four times a day (QID) | ORAL | Status: DC | PRN

## 2015-01-22 NOTE — Discharge Instructions (Signed)
Ibuprofen 600 mg 3 times daily for the next 5 days. Percocet as prescribed as needed for pain not relieved with ibuprofen.  Bactrim as prescribed. Apply warm soaks to the area as frequently as possible for the next several days.  Follow-up with your primary Dr. if not improving in the next week.   Abscess An abscess is an infected area that contains a collection of pus and debris.It can occur in almost any part of the body. An abscess is also known as a furuncle or boil. CAUSES  An abscess occurs when tissue gets infected. This can occur from blockage of oil or sweat glands, infection of hair follicles, or a minor injury to the skin. As the body tries to fight the infection, pus collects in the area and creates pressure under the skin. This pressure causes pain. People with weakened immune systems have difficulty fighting infections and get certain abscesses more often.  SYMPTOMS Usually an abscess develops on the skin and becomes a painful mass that is red, warm, and tender. If the abscess forms under the skin, you may feel a moveable soft area under the skin. Some abscesses break open (rupture) on their own, but most will continue to get worse without care. The infection can spread deeper into the body and eventually into the bloodstream, causing you to feel ill.  DIAGNOSIS  Your caregiver will take your medical history and perform a physical exam. A sample of fluid may also be taken from the abscess to determine what is causing your infection. TREATMENT  Your caregiver may prescribe antibiotic medicines to fight the infection. However, taking antibiotics alone usually does not cure an abscess. Your caregiver may need to make a small cut (incision) in the abscess to drain the pus. In some cases, gauze is packed into the abscess to reduce pain and to continue draining the area. HOME CARE INSTRUCTIONS   Only take over-the-counter or prescription medicines for pain, discomfort, or fever as  directed by your caregiver.  If you were prescribed antibiotics, take them as directed. Finish them even if you start to feel better.  If gauze is used, follow your caregiver's directions for changing the gauze.  To avoid spreading the infection:  Keep your draining abscess covered with a bandage.  Wash your hands well.  Do not share personal care items, towels, or whirlpools with others.  Avoid skin contact with others.  Keep your skin and clothes clean around the abscess.  Keep all follow-up appointments as directed by your caregiver. SEEK MEDICAL CARE IF:   You have increased pain, swelling, redness, fluid drainage, or bleeding.  You have muscle aches, chills, or a general ill feeling.  You have a fever. MAKE SURE YOU:   Understand these instructions.  Will watch your condition.  Will get help right away if you are not doing well or get worse. Document Released: 03/12/2005 Document Revised: 12/02/2011 Document Reviewed: 08/15/2011 [facility] Patient Information 2015 [place], [place]. This information is not intended to replace advice given to you by your health care provider. Make sure you discuss any questions you have with your health care provider.  Back Pain, Adult Low back pain is very common. About 1 in 5 people have back pain.The cause of low back pain is rarely dangerous. The pain often gets better over time.About half of people with a sudden onset of back pain feel better in just 2 weeks. About 8 in 10 people feel better by 6 weeks.  CAUSES Some common causes  of back pain include:  Strain of the muscles or ligaments supporting the spine.  Wear and tear (degeneration) of the spinal discs.  Arthritis.  Direct injury to the back. DIAGNOSIS Most of the time, the direct cause of low back pain is not known.However, back pain can be treated effectively even when the exact cause of the pain is unknown.Answering your caregiver's questions about your overall health  and symptoms is one of the most accurate ways to make sure the cause of your pain is not dangerous. If your caregiver needs more information, he or she may order lab work or imaging tests (X-rays or MRIs).However, even if imaging tests show changes in your back, this usually does not require surgery. HOME CARE INSTRUCTIONS For many people, back pain returns.Since low back pain is rarely dangerous, it is often a condition that people can learn to [facility] their own.   Remain active. It is stressful on the back to sit or stand in one place. Do not sit, drive, or stand in one place for more than 30 minutes at a time. Take short walks on level surfaces as soon as pain allows.Try to increase the length of time you walk each day.  Do not stay in bed.Resting more than 1 or 2 days can delay your recovery.  Do not avoid exercise or work.Your body is made to move.It is not dangerous to be active, even though your back may hurt.Your back will likely heal faster if you return to being active before your pain is gone.  Pay attention to your body when you bend and lift. Many people have less discomfortwhen lifting if they bend their knees, keep the load close to their bodies,and avoid twisting. Often, the most comfortable positions are those that put less stress on your recovering back.  Find a comfortable position to sleep. Use a firm mattress and lie on your side with your knees slightly bent. If you lie on your back, put a pillow under your knees.  Only take over-the-counter or prescription medicines as directed by your caregiver. Over-the-counter medicines to reduce pain and inflammation are often the most helpful.Your caregiver may prescribe muscle relaxant drugs.These medicines help dull your pain so you can more quickly return to your normal activities and healthy exercise.  Put ice on the injured area.  Put ice in a plastic bag.  Place a towel between your skin and the bag.  Leave the  ice on for 15-20 minutes, 03-04 times a day for the first 2 to 3 days. After that, ice and heat may be alternated to reduce pain and spasms.  Ask your caregiver about trying back exercises and gentle massage. This may be of some benefit.  Avoid feeling anxious or stressed.Stress increases muscle tension and can worsen back pain.It is important to recognize when you are anxious or stressed and learn ways to manage it.Exercise is a great option. SEEK MEDICAL CARE IF:  You have pain that is not relieved with rest or medicine.  You have pain that does not improve in 1 week.  You have new symptoms.  You are generally not feeling well. SEEK IMMEDIATE MEDICAL CARE IF:   You have pain that radiates from your back into your legs.  You develop new bowel or bladder control problems.  You have unusual weakness or numbness in your arms or legs.  You develop nausea or vomiting.  You develop abdominal pain.  You feel faint. Document Released: 06/02/2005 Document Revised: 12/02/2011 Document Reviewed: 10/04/2013  [employer]® Patient Information ©2015 [employer]. This information is not intended to replace advice given to you by your health care provider. Make sure you discuss any questions you have with your health care provider. ° °

## 2015-01-22 NOTE — ED Provider Notes (Signed)
CSN: [id number]     Arrival date & time 01/22/15  0125 History  This chart was scribed for Geoffery Lyons, MD by Evon Slack, ED Scribe. This patient was seen in room A04C/A04C and the patient's care was started at 2:37 AM.    Chief Complaint  Patient presents with  . 2 problems    Patient is a 39 y.o. female presenting with back pain. The history is provided by the patient. No language interpreter was used.  Back Pain Location:  Lumbar spine Quality:  Shooting Radiates to:  R thigh Pain severity:  Mild Onset quality:  Sudden Timing:  Constant Chronicity:  New Context: falling   Relieved by:  Lying down Worsened by:  Ambulation Associated symptoms: no bladder incontinence, no bowel incontinence, no dysuria and no fever    HPI Comments: Kaitlyn Gray is a 39 y.o. female who presents to the Emergency Department complaining of fall 1 day prior. Pt states she fell down about 6-7 steps. Pt is complaining of throbbing low right sided back pain. Pt states that the pain radiates down her right leg. She states that she took a hot shower with no relief. Pt rates the severity of her pain 10/10. Pt states that the pain is slightly better when laying down. Pt denies dysuria or bowel/bladder incontinence.   Pt is also complaining of abscess in her right axilla. She states that she has a HX of abscess under her arm but states that they usually resolve on there ow.   Pt denies fever, nausea, vomiting or drainage.   Past Medical History  Diagnosis Date  . 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 Cesarean section     No family history on file. History  Substance Use Topics  . Smoking status: Current Every Day Smoker  . Smokeless tobacco: Not on file  . Alcohol Use: No   OB History    No data available     Review of Systems  Constitutional: Negative for fever.  Gastrointestinal: Negative for bowel incontinence.   Genitourinary: Negative for bladder incontinence and dysuria.  Musculoskeletal: Positive for back pain.  Skin:       Right axilla abscess.   All other systems reviewed and are negative.     Allergies  Latex  Home Medications   Prior to Admission medications   Medication Sig Start Date End Date Taking? Authorizing Provider  cyclobenzaprine (FLEXERIL) 5 MG tablet Take 1 tablet (5 mg total) by mouth 3 (three) times daily as needed for muscle spasms. 10/06/14   Jerelyn Scott, MD  divalproex (DEPAKOTE ER) 500 MG 24 hr tablet Take 1 tablet (500 mg total) by mouth 2 (two) times daily after a meal. For improved mood stability. Patient not taking: Reported on 05/14/2014 03/25/13   Thermon Leyland, NP  FLUoxetine (PROZAC) 20 MG capsule Take 1 capsule (20 mg total) by mouth daily. For depression. Patient not taking: Reported on 05/14/2014 03/25/13   Thermon Leyland, NP  ibuprofen (ADVIL,MOTRIN) 800 MG tablet Take 1 tablet (800 mg total) by mouth every 6 (six) hours as needed for moderate pain. 05/15/14   Marisa Severin, MD  naproxen (NAPROSYN) 500 MG tablet Take 1 tablet (500 mg total) by mouth 2 (two) times daily. 10/06/14   Jerelyn Scott, MD  oxyCODONE-acetaminophen (PERCOCET/ROXICET) 5-325 MG per tablet Take 2 tablets by mouth every 6 (six) hours as needed for moderate pain or severe pain.  Patient not taking: Reported on 10/06/2014 05/15/14   Marisa Severin, MD  traZODone (DESYREL) 100 MG tablet Take 1 tablet (100 mg total) by mouth at bedtime as needed and may repeat dose one time if needed for sleep. Patient not taking: Reported on 05/14/2014 03/25/13   Thermon Leyland, NP   BP 143/90 mmHg  Pulse 88  Temp(Src) 97.8 F (36.6 C) (Oral)  Resp 16  SpO2 96%  LMP 12/23/2014   Physical Exam  Constitutional: She is oriented to person, place, and time. She appears well-developed and well-nourished. No distress.  HENT:  Head: Normocephalic and atraumatic.  Eyes: Conjunctivae and EOM are normal.  Neck: Neck  supple. No tracheal deviation present.  Cardiovascular: Normal rate.   Pulmonary/Chest: Effort normal. No respiratory distress.  Musculoskeletal: Normal range of motion. She exhibits tenderness.  Tenderness to palpation in the soft tissue of the right lumbar region.   Neurological: She is alert and oriented to person, place, and time.  DTR are trace and equal in the BLE. Strength is 5/5 in BLE. able to ambulates without assistance.   Skin: Skin is warm and dry.  Psychiatric: She has a normal mood and affect. Her behavior is normal.  Nursing note and vitals reviewed.   ED Course  Procedures (including critical care time) DIAGNOSTIC STUDIES: Oxygen Saturation is 96% on RA, adequate by my interpretation.    COORDINATION OF CARE: 2:54 AM-Discussed treatment plan with pt at bedside and pt agreed to plan.     Labs Review Labs Reviewed - No data to display  Imaging Review No results found.   EKG Interpretation None      MDM   Final diagnoses:  None   X-rays are negative for fracture and there is nothing on physical exam to suggest an emergent situation. I will treat her back pain with anti-inflammatory and Percocet. She also has a draining abscess in the right axilla that will be treated with warm soaks, antibiotics, and when necessary return.   I personally performed the services described in this documentation, which was scribed in my presence. The recorded information has been reviewed and is accurate.        Geoffery Lyons, MD 01/22/15 [phone]

## 2015-01-22 NOTE — ED Notes (Signed)
The pt fell down some steps earlier today c/o pain in her back, she also wants to be seen for an abscess under her rt arm that she just noticed tonight also.  Hx of the same

## 2015-03-22 ENCOUNTER — Encounter (HOSPITAL_COMMUNITY): Payer: Self-pay | Admitting: Emergency Medicine

## 2015-03-22 ENCOUNTER — Emergency Department (HOSPITAL_COMMUNITY)
Admission: EM | Admit: 2015-03-22 | Discharge: 2015-03-23 | Payer: Medicaid Other | Attending: Emergency Medicine | Admitting: Emergency Medicine

## 2015-03-22 DIAGNOSIS — F32A Depression, unspecified: Secondary | ICD-10-CM

## 2015-03-22 DIAGNOSIS — E119 Type 2 diabetes mellitus without complications: Secondary | ICD-10-CM | POA: Diagnosis not present

## 2015-03-22 DIAGNOSIS — Z3202 Encounter for pregnancy test, result negative: Secondary | ICD-10-CM | POA: Insufficient documentation

## 2015-03-22 DIAGNOSIS — Z791 Long term (current) use of non-steroidal anti-inflammatories (NSAID): Secondary | ICD-10-CM | POA: Insufficient documentation

## 2015-03-22 DIAGNOSIS — Z91040 Latex allergy status: Secondary | ICD-10-CM | POA: Insufficient documentation

## 2015-03-22 DIAGNOSIS — F1410 Cocaine abuse, uncomplicated: Secondary | ICD-10-CM | POA: Diagnosis not present

## 2015-03-22 DIAGNOSIS — F1210 Cannabis abuse, uncomplicated: Secondary | ICD-10-CM | POA: Insufficient documentation

## 2015-03-22 DIAGNOSIS — F329 Major depressive disorder, single episode, unspecified: Secondary | ICD-10-CM | POA: Diagnosis not present

## 2015-03-22 DIAGNOSIS — Z720 Tobacco use: Secondary | ICD-10-CM | POA: Diagnosis not present

## 2015-03-22 DIAGNOSIS — R45851 Suicidal ideations: Secondary | ICD-10-CM | POA: Diagnosis present

## 2015-03-22 HISTORY — DX: Bipolar disorder, unspecified: F31.9

## 2015-03-22 LAB — COMPREHENSIVE METABOLIC PANEL
ALT: 12 U/L — ABNORMAL LOW (ref 14–54)
ANION GAP: 8 (ref 5–15)
AST: 17 U/L (ref 15–41)
Albumin: 4.2 g/dL (ref 3.5–5.0)
Alkaline Phosphatase: 70 U/L (ref 38–126)
BILIRUBIN TOTAL: 0.3 mg/dL (ref 0.3–1.2)
BUN: 9 mg/dL (ref 6–20)
CO2: 25 mmol/L (ref 22–32)
Calcium: 9 mg/dL (ref 8.9–10.3)
Chloride: 106 mmol/L (ref 101–111)
Creatinine, Ser: 0.94 mg/dL (ref 0.44–1.00)
GFR calc Af Amer: >60 mL/min (ref >60)
Glucose, Bld: 155 mg/dL — ABNORMAL HIGH (ref 65–99)
POTASSIUM: 3.4 mmol/L — AB (ref 3.5–5.1)
Sodium: 139 mmol/L (ref 135–145)
TOTAL PROTEIN: 8 g/dL (ref 6.5–8.1)

## 2015-03-22 LAB — CBC
HCT: 38.4 % (ref 36.0–46.0)
Hemoglobin: 12.6 g/dL (ref 12.0–15.0)
MCH: 28.1 pg (ref 26.0–34.0)
MCHC: 32.8 g/dL (ref 30.0–36.0)
MCV: 85.5 fL (ref 78.0–100.0)
PLATELETS: 320 10*3/uL (ref 150–400)
RBC: 4.49 MIL/uL (ref 3.87–5.11)
RDW: 14.7 % (ref 11.5–15.5)
WBC: 5.4 10*3/uL (ref 4.0–10.5)

## 2015-03-22 LAB — SALICYLATE LEVEL: Salicylate Lvl: <4 mg/dL (ref 2.8–30.0)

## 2015-03-22 LAB — VALPROIC ACID LEVEL

## 2015-03-22 LAB — I-STAT BETA HCG BLOOD, ED (MC, WL, AP ONLY)

## 2015-03-22 LAB — ETHANOL: Alcohol, Ethyl (B): <5 mg/dL (ref <5)

## 2015-03-22 LAB — RAPID URINE DRUG SCREEN, HOSP PERFORMED
Amphetamines: NOT DETECTED
BENZODIAZEPINES: NOT DETECTED
Barbiturates: NOT DETECTED
Cocaine: POSITIVE — AB
Opiates: NOT DETECTED
Tetrahydrocannabinol: POSITIVE — AB

## 2015-03-22 LAB — ACETAMINOPHEN LEVEL

## 2015-03-22 MED ORDER — HYDROXYZINE HCL 25 MG PO TABS
50.0000 mg | ORAL_TABLET | Freq: Three times a day (TID) | ORAL | Status: DC | PRN
  Administered 2015-03-22: 50 mg via ORAL
  Filled 2015-03-22: qty 2

## 2015-03-22 MED ORDER — TRAZODONE HCL 100 MG PO TABS: 100.0000 mg | ORAL_TABLET | Freq: Every evening | ORAL | Status: DC | PRN

## 2015-03-22 MED ORDER — ACETAMINOPHEN 325 MG PO TABS: 650.0000 mg | ORAL_TABLET | ORAL | Status: DC | PRN

## 2015-03-22 MED ORDER — FLUOXETINE HCL 20 MG PO CAPS
20.0000 mg | ORAL_CAPSULE | Freq: Every day | ORAL | Status: DC
  Filled 2015-03-22 (×2): qty 1

## 2015-03-22 MED ORDER — IBUPROFEN 800 MG PO TABS: 800.0000 mg | ORAL_TABLET | Freq: Four times a day (QID) | ORAL | Status: DC | PRN

## 2015-03-22 MED ORDER — DIVALPROEX SODIUM ER 500 MG PO TB24
500.0000 mg | ORAL_TABLET | Freq: Two times a day (BID) | ORAL | Status: DC
  Administered 2015-03-22 – 2015-03-23 (×2): 500 mg via ORAL
  Filled 2015-03-22 (×4): qty 1

## 2015-03-22 MED ORDER — ALUM & MAG HYDROXIDE-SIMETH 200-200-20 MG/5ML PO SUSP: 30.0000 mL | ORAL | Status: DC | PRN

## 2015-03-22 NOTE — ED Notes (Signed)
Pt requested to change rooms because she was scared of another patient.  Pt moved from 36 to 41.

## 2015-03-22 NOTE — ED Notes (Signed)
Patient denies SI, HI and AVh at this time. Patient calm and cooperative at this time. Plan of care discussed with patient. Patient voices no complaints or concerns at this time. Encouragement and support provided and safety maintain. Q 15 min safety checks remain in place.

## 2015-03-22 NOTE — ED Notes (Signed)
1 bag pt belongings and 1 purse with belongings placed in [address]. Valuables locked in lock box with security and medications sent to pharmacy for storage. Paper valuables inventory in chart.

## 2015-03-22 NOTE — ED Provider Notes (Signed)
CSN: [id number]     Arrival date & time 03/22/15  1354 History   First MD Initiated Contact with Patient 03/22/15 1516     Chief Complaint  Patient presents with  . Suicidal   HPI Patient presents to the emergency room with complaints of suicidal ideation. Patient has a history of depression, bipolar disorder and substance abuse.  Patient has history of cocaine abuse as well. She recently using cocaine but knows that she wants to stop using.  Patient has been thinking of overdosing on pills. She also tried to cut herself with a knife. Patient has had some auditory hallucinations that include demons, and people talking to her. Past Medical History  Diagnosis Date  . Diabetes mellitus without complication (HCC)     gestational  . Bipolar 1 disorder (HCC)   . Gestational diabetes    Past Surgical History  Procedure Laterality Date  . Cesarean section     History reviewed. No pertinent family history. Social History  Substance Use Topics  . Smoking status: Current Every Day Smoker  . Smokeless tobacco: None  . Alcohol Use: No   OB History    No data available     Review of Systems  All other systems reviewed and are negative.     Allergies  Hydrocodone and Latex  Home Medications   Prior to Admission medications   Medication Sig Start Date End Date Taking? Authorizing Provider  cyclobenzaprine (FLEXERIL) 5 MG tablet Take 1 tablet (5 mg total) by mouth 3 (three) times daily as needed for muscle spasms. 10/06/14   Jerelyn Scott, MD  divalproex (DEPAKOTE ER) 500 MG 24 hr tablet Take 1 tablet (500 mg total) by mouth 2 (two) times daily after a meal. For improved mood stability. Patient not taking: Reported on 05/14/2014 03/25/13   Thermon Leyland, NP  FLUoxetine (PROZAC) 20 MG capsule Take 1 capsule (20 mg total) by mouth daily. For depression. Patient not taking: Reported on 05/14/2014 03/25/13   Thermon Leyland, NP  ibuprofen (ADVIL,MOTRIN) 800 MG tablet Take 1 tablet (800 mg  total) by mouth every 6 (six) hours as needed for moderate pain. 05/15/14   Marisa Severin, MD  naproxen (NAPROSYN) 500 MG tablet Take 1 tablet (500 mg total) by mouth 2 (two) times daily. 10/06/14   Jerelyn Scott, MD  oxyCODONE-acetaminophen (PERCOCET) 5-325 MG per tablet Take 1-2 tablets by mouth every 6 (six) hours as needed. 01/22/15   Geoffery Lyons, MD  traZODone (DESYREL) 100 MG tablet Take 1 tablet (100 mg total) by mouth at bedtime as needed and may repeat dose one time if needed for sleep. Patient not taking: Reported on 05/14/2014 03/25/13   Thermon Leyland, NP   BP 150/91 mmHg  Pulse 76  Temp(Src) 98.4 F (36.9 C) (Oral)  Resp 20  SpO2 93% Physical Exam  Constitutional: She appears well-developed and well-nourished. No distress.  HENT:  Head: Normocephalic and atraumatic.  Right Ear: External ear normal.  Left Ear: External ear normal.  Eyes: Conjunctivae are normal. Right eye exhibits no discharge. Left eye exhibits no discharge. No scleral icterus.  Neck: Neck supple. No tracheal deviation present.  Cardiovascular: Normal rate, regular rhythm and intact distal pulses.   Pulmonary/Chest: Effort normal and breath sounds normal. No stridor. No respiratory distress. She has no wheezes. She has no rales.  Abdominal: Soft. Bowel sounds are normal. She exhibits no distension. There is no tenderness. There is no rebound and no guarding.  Musculoskeletal: She exhibits  no edema or tenderness.  Neurological: She is alert. She has normal strength. No cranial nerve deficit (no facial droop, extraocular movements intact, no slurred speech) or sensory deficit. She exhibits normal muscle tone. She displays no seizure activity. Coordination normal.  Skin: Skin is warm and dry. No rash noted.  Psychiatric: She has a normal mood and affect. Her affect is not labile. Her speech is not rapid and/or pressured, not delayed and not tangential. She expresses suicidal ideation.  Nursing note and vitals  reviewed.   ED Course  Procedures (including critical care time) Labs Review Labs Reviewed  COMPREHENSIVE METABOLIC PANEL - Abnormal; Notable for the following:    Potassium 3.4 (*)    Glucose, Bld 155 (*)    ALT 12 (*)    All other components within normal limits  ACETAMINOPHEN LEVEL - Abnormal; Notable for the following:    Acetaminophen (Tylenol), Serum <10 (*)    All other components within normal limits  ETHANOL  SALICYLATE LEVEL  CBC  URINE RAPID DRUG SCREEN, HOSP PERFORMED  VALPROIC ACID LEVEL  I-STAT BETA HCG BLOOD, ED ([facility], [facility], [facility] ONLY)    Medications  alum & mag hydroxide-simeth (MAALOX/MYLANTA) 200-200-20 MG/5ML suspension 30 mL (not administered)  acetaminophen (TYLENOL) tablet 650 mg (not administered)  divalproex (DEPAKOTE ER) 24 hr tablet 500 mg (not administered)  FLUoxetine (PROZAC) capsule 20 mg (not administered)  traZODone (DESYREL) tablet 100 mg (not administered)  ibuprofen (ADVIL,MOTRIN) tablet 800 mg (not administered)     MDM   Patient has history of depression and substance abuse. She has had recent suicidal thoughts. Patient wants to stop using drugs. She appears medically stable. I'll consult the psychiatry team for assessment.   Linwood Dibbles, MD 03/22/15 [phone]
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
## 2015-03-22 NOTE — ED Notes (Signed)
TTS into see 

## 2015-03-22 NOTE — ED Notes (Signed)
Pt ambulatory w/o difficulty from tcu 

## 2015-03-22 NOTE — Progress Notes (Signed)
Patient was referred for inpatient psych treatment at: [facility] - per intake, fax referral for review. [facility] - per Casimiro Needle, fax referral, beds open. [facility] - per Maryelizabeth Kaufmann, fax referral, couple beds open. [facility] - left voicemail with contact number regarding bed availability. [facility] - per Britta Mccreedy, adult female beds open. [facility] - per intake, fax referral for review.  [facility] and [facility] - don't take adult medicaid.  At capacity: [facility] Disposition staff 03/22/2015 8:04 PM
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
## 2015-03-23 ENCOUNTER — Inpatient Hospital Stay (HOSPITAL_COMMUNITY)
Admission: EM | Admit: 2015-03-23 | Discharge: 2015-03-24 | DRG: 885 | Disposition: A | Discharge status: Home / Self Care | Payer: Medicaid Other | Source: Intra-hospital | Attending: Psychiatry | Admitting: Psychiatry

## 2015-03-23 ENCOUNTER — Encounter (HOSPITAL_COMMUNITY): Payer: Self-pay

## 2015-03-23 DIAGNOSIS — F332 Major depressive disorder, recurrent severe without psychotic features: Secondary | ICD-10-CM | POA: Diagnosis not present

## 2015-03-23 DIAGNOSIS — E119 Type 2 diabetes mellitus without complications: Secondary | ICD-10-CM | POA: Diagnosis present

## 2015-03-23 DIAGNOSIS — F1420 Cocaine dependence, uncomplicated: Secondary | ICD-10-CM | POA: Diagnosis not present

## 2015-03-23 DIAGNOSIS — F1220 Cannabis dependence, uncomplicated: Secondary | ICD-10-CM | POA: Diagnosis not present

## 2015-03-23 DIAGNOSIS — F32A Depression, unspecified: Secondary | ICD-10-CM | POA: Diagnosis present

## 2015-03-23 DIAGNOSIS — F329 Major depressive disorder, single episode, unspecified: Secondary | ICD-10-CM | POA: Diagnosis not present

## 2015-03-23 DIAGNOSIS — F17200 Nicotine dependence, unspecified, uncomplicated: Secondary | ICD-10-CM | POA: Diagnosis present

## 2015-03-23 DIAGNOSIS — F333 Major depressive disorder, recurrent, severe with psychotic symptoms: Secondary | ICD-10-CM | POA: Diagnosis present

## 2015-03-23 MED ORDER — PNEUMOCOCCAL VAC POLYVALENT 25 MCG/0.5ML IJ INJ: 0.5000 mL | INJECTION | INTRAMUSCULAR | Status: DC

## 2015-03-23 MED ORDER — FLUOXETINE HCL 20 MG PO CAPS
20.0000 mg | ORAL_CAPSULE | Freq: Every day | ORAL | Status: DC
  Filled 2015-03-23 (×3): qty 1

## 2015-03-23 MED ORDER — MAGNESIUM HYDROXIDE 400 MG/5ML PO SUSP: 30.0000 mL | Freq: Every day | ORAL | Status: DC | PRN

## 2015-03-23 MED ORDER — HYDROXYZINE HCL 50 MG PO TABS
50.0000 mg | ORAL_TABLET | Freq: Three times a day (TID) | ORAL | Status: DC | PRN
  Filled 2015-03-23: qty 1

## 2015-03-23 MED ORDER — INFLUENZA VAC SPLIT QUAD 0.5 ML IM SUSY
0.5000 mL | PREFILLED_SYRINGE | INTRAMUSCULAR | Status: DC
  Filled 2015-03-23: qty 0.5

## 2015-03-23 MED ORDER — DIVALPROEX SODIUM ER 500 MG PO TB24
500.0000 mg | ORAL_TABLET | Freq: Two times a day (BID) | ORAL | Status: DC
  Administered 2015-03-23 – 2015-03-24 (×2): 500 mg via ORAL
  Filled 2015-03-23 (×6): qty 1

## 2015-03-23 MED ORDER — TRAZODONE HCL 100 MG PO TABS
100.0000 mg | ORAL_TABLET | Freq: Every evening | ORAL | Status: DC | PRN
  Filled 2015-03-23: qty 1

## 2015-03-23 NOTE — Progress Notes (Signed)
Patient accepted to [facility] room 305/1 in the morning. Rosey Bath, RN

## 2015-03-23 NOTE — Plan of Care (Signed)
Problem: Alteration in mood & ability to function due to Goal: LTG-Pt reports reduction in suicidal thoughts (Patient reports reduction in suicidal thoughts and is able to verbalize a safety plan for whenever patient is feeling suicidal)  Outcome: Progressing Pt denies SI at this time Goal: LTG-Pt verbalizes understanding of importance of med regimen (Patient verbalizes understanding of importance of medication regimen and need to continue outpatient care and support groups)  Outcome: Progressing Pt stated she wanted to get back on her meds ao she could start feeling better

## 2015-03-23 NOTE — ED Notes (Signed)
Patient refused to take her Prozac.  She states that it upsets her stomach.

## 2015-03-23 NOTE — ED Notes (Signed)
Pt. Ready for discharge/transfer to [facility] RM 305-1, [facility] called for transport and report given to [place], [place]

## 2015-03-23 NOTE — ED Notes (Signed)
Pt. Leaving the ED at this time via [facility] transportation to [facility] for admission.

## 2015-03-23 NOTE — BH Assessment (Signed)
[facility] Assessment Progress Note  Per Kathryne Sharper, MD, this pt requires psychiatric hospitalization at this time.  Per shift report, pt has been assigned to Rm 305-1.  Pt has signed Voluntary Admission and Consent for Treatment, as well as Consent to Release Information to her boyfriend and her aunt, and signed forms have been faxed to [facility].  Pt's nurse has been notified, and agrees to send original paperwork along with pt via [facility], and to call report to [phone].  Doylene Canning, MA Triage Specialist [phone]

## 2015-03-23 NOTE — Progress Notes (Signed)
D: Pt denies SI/HI/AVH. Pt is pleasant and cooperative. Pt stated when they took her off her Seroquel that's when she started decompensating and eventually started self medicating with drugs. Pt appears to have good insight and appears to want to get better and be there for her kids.   A: Pt was offered support and encouragement. Pt was given scheduled medications. Pt was encourage to attend groups. Q 15 minute checks were done for safety.   R:Pt attends groups and interacts well with peers and staff. Pt is taking medication. Pt has no complaints at this time .Pt receptive to treatment and safety maintained on unit.
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
## 2015-03-23 NOTE — Progress Notes (Signed)
Pt continues to present as suicidal and meets inpatient criteria. She has been accepted at [facility] and has signed voluntary paperwork.   Beau Fanny, FNP 03/23/2015 10:13 AM

## 2015-03-24 ENCOUNTER — Encounter (HOSPITAL_COMMUNITY): Payer: Self-pay | Admitting: Psychiatry

## 2015-03-24 DIAGNOSIS — F1420 Cocaine dependence, uncomplicated: Secondary | ICD-10-CM | POA: Diagnosis present

## 2015-03-24 DIAGNOSIS — F332 Major depressive disorder, recurrent severe without psychotic features: Principal | ICD-10-CM

## 2015-03-24 DIAGNOSIS — F1220 Cannabis dependence, uncomplicated: Secondary | ICD-10-CM | POA: Diagnosis present

## 2015-03-24 DIAGNOSIS — F32A Depression, unspecified: Secondary | ICD-10-CM | POA: Diagnosis present

## 2015-03-24 HISTORY — DX: Major depressive disorder, recurrent severe without psychotic features: F33.2

## 2015-03-24 HISTORY — DX: Cocaine dependence, uncomplicated: F14.20

## 2015-03-24 HISTORY — DX: Cannabis dependence, uncomplicated: F12.20

## 2015-03-24 MED ORDER — HYDROXYZINE HCL 50 MG PO TABS: 50.0000 mg | ORAL_TABLET | Freq: Three times a day (TID) | ORAL | Status: DC | PRN

## 2015-03-24 MED ORDER — CITALOPRAM HYDROBROMIDE 10 MG PO TABS: 10.0000 mg | ORAL_TABLET | Freq: Every day | ORAL | Status: DC

## 2015-03-24 MED ORDER — DIVALPROEX SODIUM 500 MG PO DR TAB
500.0000 mg | DELAYED_RELEASE_TABLET | Freq: Two times a day (BID) | ORAL | Status: DC
  Filled 2015-03-24 (×4): qty 1

## 2015-03-24 MED ORDER — DIVALPROEX SODIUM 500 MG PO DR TAB: 500.0000 mg | DELAYED_RELEASE_TABLET | Freq: Two times a day (BID) | ORAL | Status: DC

## 2015-03-24 MED ORDER — TRAZODONE HCL 100 MG PO TABS: 100.0000 mg | ORAL_TABLET | Freq: Every evening | ORAL | Status: DC | PRN

## 2015-03-24 MED ORDER — CITALOPRAM HYDROBROMIDE 10 MG PO TABS
10.0000 mg | ORAL_TABLET | Freq: Every day | ORAL | Status: DC
  Administered 2015-03-24: 10 mg via ORAL
  Filled 2015-03-24 (×3): qty 1

## 2015-03-24 NOTE — Progress Notes (Signed)
Patient ID: Kaitlyn Gray, female   DOB: 10-24-1975, 39 y.o.   MRN: [id number]   Pt was discharged home per her request, pt reported that her children were at home with her boyfriend and that he could not take care of them. Pt reported that she came to the hospital wanting help, but could never get what she needed at [facility] when her children needed her. Pt reported that her depression was a 0, and her helplessness was a 0. Pt reported being negative SI/HI, no AH/VH noted. Pt was discharged with a cab voucher.

## 2015-03-24 NOTE — BHH Group Notes (Signed)
B[facility] Group Notes:  (Clinical Social Work)  03/24/2015     10-11AM  Summary of Progress/Problems:   The main focus of today's process group was to learn how to use a decisional balance exercise to move forward in the Stages of Change.  Patients listed needs on the whiteboard and unhealthy coping techniques often used to fill needs.  Motivational Interviewing and the whiteboard were utilized to help patients explore in depth the perceived benefits and costs of unhealthy coping techniques, as well as the  benefits and costs of replacing that with a healthy coping skills.   The patient expressed that her own unhealthy coping involves using crack cocaine.  She was very interactive throughout group.  She talked about how she has never previously wanted to stay sober as bad as she does now.  She stated she has stayed sober for as long as 6 years previously, but only in her home state of [place].  She is somewhat nervous about doing so in [place] but feels she has a lot of support.  She looks forward to going to a SAIOP program.  Type of Therapy:  Group Therapy - Process   Participation Level:  Active  Participation Quality:  Appropriate, Attentive, Sharing and Supportive  Affect:  Appropriate  Cognitive:  Alert, Appropriate and Oriented  Insight:  Engaged  Engagement in Therapy:  Engaged  Modes of Intervention:  Education, Motivational Interviewing  Ambrose Mantle, LCSW 03/24/2015, 12:14 PM

## 2015-03-24 NOTE — BHH Suicide Risk Assessment (Signed)
[facility] Admission Suicide Risk Assessment   Nursing information obtained from:  Patient Demographic factors:  Low socioeconomic status Current Mental Status:  NA Loss Factors:  Loss of significant relationship, Decline in physical health, Legal issues, Financial problems / change in socioeconomic status Historical Factors:  Prior suicide attempts, Family history of mental illness or substance abuse, Impulsivity Risk Reduction Factors:  Responsible for children under 37 years of age, Sense of responsibility to family, Religious beliefs about death, Positive coping skills or problem solving skills Total Time spent with patient: 30 minutes Principal Problem: Severe episode of recurrent major depressive disorder (HCC) Diagnosis:   Patient Active Problem List   Diagnosis Date Noted  . Cocaine use disorder, severe, dependence (HCC) [F14.20] 03/24/2015  . Cannabis use disorder, moderate, dependence (HCC) [F12.20] 03/24/2015  . Severe episode of recurrent major depressive disorder (HCC) [F33.2] 03/23/2013     Continued Clinical Symptoms:  Alcohol Use Disorder Identification Test Final Score (AUDIT): 0 The "Alcohol Use Disorders Identification Test", Guidelines for Use in Primary Care, Second Edition.  World Science writer [facility]). Score between 0-7:  no or low risk or alcohol related problems. Score between 8-15:  moderate risk of alcohol related problems. Score between 16-19:  high risk of alcohol related problems. Score 20 or above:  warrants further diagnostic evaluation for alcohol dependence and treatment.   CLINICAL FACTORS:   Alcohol/Substance Abuse/Dependencies Previous Psychiatric Diagnoses and Treatments   Musculoskeletal: Strength & Muscle Tone: within normal limits Gait & Station: normal Patient leans: N/A  Psychiatric Specialty Exam: Physical Exam  Review of Systems  Psychiatric/Behavioral: Positive for substance abuse. Negative for suicidal ideas.  All other systems  reviewed and are negative.   Blood pressure 161/96, pulse 75, temperature 98.5 F (36.9 C), temperature source Oral, resp. rate 18, height 5\' 4"  (1.626 m), weight 97.07 kg (214 lb), last menstrual period 03/23/2015, SpO2 98 %.Body mass index is 36.72 kg/(m^2).                        Please see H&P.                                  COGNITIVE FEATURES THAT CONTRIBUTE TO RISK:  Polarized thinking    SUICIDE RISK:   Mild:  Suicidal ideation of limited frequency, intensity, duration, and specificity.  There are no identifiable plans, no associated intent, mild dysphoria and related symptoms, good self-control (both objective and subjective assessment), few other risk factors, and identifiable protective factors, including available and accessible social support.  PLAN OF CARE: Please see H&P.   Medical Decision Making:  Review of Psycho-Social Stressors (1), Review and summation of old records (2), Review of Last Therapy Session (1), Review of Medication Regimen & Side Effects (2) and Review of New Medication or Change in Dosage (2)  I certify that inpatient services furnished can reasonably be expected to improve the patient's condition.   Harshita Bernales MD 03/24/2015, 10:13 AM
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
## 2015-03-24 NOTE — [facility] Suicide Risk Assessment (Signed)
[facility] INPATIENT:  Family/Significant Other Suicide Prevention Education  Suicide Prevention Education:  Education Completed; Boyfriend Kaitlyn Gray [phone] has been identified by the patient as the family member/significant other with whom the patient will be residing, and identified as the person(s) who will aid the patient in the event of a mental health crisis (suicidal ideations/suicide attempt).  With written consent from the patient, the family member/significant other has been provided the following suicide prevention education, prior to the and/or following the discharge of the patient.  The suicide prevention education provided includes the following:  Suicide risk factors  Suicide prevention and interventions  National Suicide Hotline telephone number  [facility] assessment telephone number  [facility] Emergency Assistance 911  [facility] and/or Residential Mobile Crisis Unit telephone number  Request made of family/significant other to:  Remove weapons (e.g., guns, rifles, knives), all items previously/currently identified as safety concern.    Remove drugs/medications (over-the-counter, prescriptions, illicit drugs), all items previously/currently identified as a safety concern.  The family member/significant other verbalizes understanding of the suicide prevention education information provided.  The family member/significant other agrees to remove the items of safety concern listed above.  Boyfriend stated there are no guns in the home, and with the information provided about suicide risk, he will start to walk with her when she goes out at night for a walk to calm down.  He feels she is ready to discharge home.  Kaitlyn Gray 03/24/2015, 12:44 PM

## 2015-03-24 NOTE — [facility] Suicide Risk Assessment (Signed)
[facility] Discharge Suicide Risk Assessment   Demographic Factors:  Unemployed  Total Time spent with patient: 30 minutes  Musculoskeletal: Strength & Muscle Tone: within normal limits Gait & Station: normal Patient leans: N/A  Psychiatric Specialty Exam: Physical Exam  Review of Systems  Psychiatric/Behavioral: Positive for depression and substance abuse.  All other systems reviewed and are negative.   Blood pressure 161/96, pulse 75, temperature 98.5 F (36.9 C), temperature source Oral, resp. rate 18, height 5\' 4"  (1.626 m), weight 97.07 kg (214 lb), last menstrual period 03/23/2015, SpO2 98 %.Body mass index is 36.72 kg/(m^2).  General Appearance: Casual  Eye Contact::  Fair  Speech:  Clear and Coherent409  Volume:  Normal  Mood:  Depressed  Affect:  Appropriate  Thought Process:  Coherent  Orientation:  Full (Time, Place, and Person)  Thought Content:  WDL  Suicidal Thoughts:  No  Homicidal Thoughts:  No  Memory:  Immediate;   Fair Recent;   Fair Remote;   Fair  Judgement:  Impaired  Insight:  Fair  Psychomotor Activity:  Normal  Concentration:  Fair  Recall:  [employer] of Knowledge:Fair  Language: Fair  Akathisia:  No  Handed:  Right  AIMS (if indicated):     Assets:  Communication Skills Desire for Improvement  Sleep:     Cognition: WNL  ADL's:  Intact      Has this patient used any form of tobacco in the last 30 days? (Cigarettes, Smokeless Tobacco, Cigars, and/or Pipes) Yes, A prescription for nicotine patch provided .   Mental Status Per Nursing Assessment::   On Admission:  NA  Current Mental Status by Physician: pt denies SI/HI/AH/VH  Loss Factors: Decrease in vocational status  Historical Factors: Impulsivity  Risk Reduction Factors:   Responsible for children under 42 years of age, Sense of responsibility to family, Religious beliefs about death and Positive social support  Continued Clinical Symptoms:  Alcohol/Substance  Abuse/Dependencies Previous Psychiatric Diagnoses and Treatments  Cognitive Features That Contribute To Risk:  None    Suicide Risk:  Minimal: No identifiable suicidal ideation.  Patients presenting with no risk factors but with morbid ruminations; may be classified as minimal risk based on the severity of the depressive symptoms  Principal Problem: Severe episode of recurrent major depressive disorder [facility]) Discharge Diagnoses:  Patient Active Problem List   Diagnosis Date Noted  . Cocaine use disorder, severe, dependence (HCC) [F14.20] 03/24/2015  . Cannabis use disorder, moderate, dependence (HCC) [F12.20] 03/24/2015  . Severe episode of recurrent major depressive disorder (HCC) [F33.2] 03/23/2013    Follow-up Information    Follow up with [facility] Schedule an appointment as soon as possible for a visit in 2 days.   Why:  Call and make appointment to start Substance Abuse Intensive Outpatient Program (SAIOP) and medication management.   Contact information:   [address] [place]  [address] [phone]       Follow up with [facility]. Go in 2 days.   Specialty:  Behavioral Health   Why:  As needed - If [facility] is not able to see you immediately for medication management, go to [facility] any day Monday through Friday 8am-3pm for same-day medication management.  It is a long process, so plan to stay for 4-5 hours.   Contact information:   [address] [place] [address] [phone]       Plan Of Care/Follow-up recommendations:  Activity:  No restrictions Diet:  regular Tests:  as needed- depakote level as per out patient recommendations Other:  follow up with after care   Is patient on multiple antipsychotic therapies at discharge:  No   Has Patient had three or more failed trials of antipsychotic monotherapy by history:  No  Recommended Plan for Multiple Antipsychotic Therapies: NA    Jagjit Riner  MD 03/24/2015, 11:27 AM

## 2015-03-24 NOTE — Progress Notes (Signed)
  [facility] Adult Case Management Discharge Plan :  Will you be returning to the same living situation after discharge:  Yes,  with boyfriend and childrne At discharge, do you have transportation home?: Yes,  provided a bus pass Do you have the ability to pay for your medications: Yes,  no issues raised  Release of information consent forms completed and in the chart;  Patient's signature needed at discharge.  Patient to Follow up at: Follow-up Information    Schedule an appointment as soon as possible for a visit with [facility]   Why:  To start Substance Abuse Intensive Outpatient Program (SAIOP) and medication management.   Contact information:   [address] [place]  [address] [phone]       Follow up with [facility]. Go in 2 days.   Specialty:  Behavioral Health   Why:  If [facility] is not able to see you immediately for medication management, go to [facility] any day Monday through Friday 8am-3pm for same-day medication management.  It is a long process, so plan to stay for 4-5 hours.   Contact information:   [address] [place] [address] [phone]       Patient denies SI/HI: Yes,  see doctor SRA    Safety Planning and Suicide Prevention discussed: Yes,  with patient and with boyfriend     Has patient been referred to the Quitline?: Patient refused referral  [name] 03/24/2015, 12:46 PM
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
## 2016-02-12 ENCOUNTER — Emergency Department (HOSPITAL_COMMUNITY)
Admission: EM | Admit: 2016-02-12 | Discharge: 2016-02-12 | Disposition: A | Discharge status: Home / Self Care | Payer: Medicaid Other | Attending: Emergency Medicine | Admitting: Emergency Medicine

## 2016-02-12 ENCOUNTER — Encounter (HOSPITAL_COMMUNITY): Payer: Self-pay

## 2016-02-12 DIAGNOSIS — G8929 Other chronic pain: Secondary | ICD-10-CM | POA: Diagnosis not present

## 2016-02-12 DIAGNOSIS — F17200 Nicotine dependence, unspecified, uncomplicated: Secondary | ICD-10-CM | POA: Diagnosis not present

## 2016-02-12 DIAGNOSIS — M5440 Lumbago with sciatica, unspecified side: Secondary | ICD-10-CM | POA: Insufficient documentation

## 2016-02-12 DIAGNOSIS — M549 Dorsalgia, unspecified: Secondary | ICD-10-CM

## 2016-02-12 DIAGNOSIS — M546 Pain in thoracic spine: Secondary | ICD-10-CM

## 2016-02-12 DIAGNOSIS — M5442 Lumbago with sciatica, left side: Secondary | ICD-10-CM

## 2016-02-12 DIAGNOSIS — M5441 Lumbago with sciatica, right side: Secondary | ICD-10-CM

## 2016-02-12 DIAGNOSIS — Z91040 Latex allergy status: Secondary | ICD-10-CM | POA: Diagnosis not present

## 2016-02-12 MED ORDER — IBUPROFEN 400 MG PO TABS
800.0000 mg | ORAL_TABLET | Freq: Once | ORAL | Status: AC
  Administered 2016-02-12: 800 mg via ORAL
  Filled 2016-02-12: qty 2

## 2016-02-12 MED ORDER — METHOCARBAMOL 500 MG PO TABS
500.0000 mg | ORAL_TABLET | Freq: Once | ORAL | Status: AC
  Administered 2016-02-12: 500 mg via ORAL
  Filled 2016-02-12: qty 1

## 2016-02-12 MED ORDER — METHOCARBAMOL 500 MG PO TABS: 500.0000 mg | ORAL_TABLET | Freq: Two times a day (BID) | ORAL | 0 refills | Status: DC

## 2016-02-12 MED ORDER — NAPROXEN 500 MG PO TABS: 500.0000 mg | ORAL_TABLET | Freq: Two times a day (BID) | ORAL | 0 refills | Status: DC

## 2016-02-12 NOTE — ED Notes (Addendum)
C/o mid and lower back pain radiating to both legs x "years". States Dr. Ronne BinningMcKenzie usually gives her Oxycodone but she can't get in to see him for 3 weeks. Pt has hx of substance abuse.

## 2016-02-12 NOTE — ED Triage Notes (Signed)
Patient complains of lower back pain with radiation down legs since last pm, unsure if she injured while lifting something. Ambulatory on arrival, NAD
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
## 2016-02-12 NOTE — Discharge Instructions (Signed)
1. Medications: robaxin, naproxyn, usual home medications °2. Treatment: rest, drink plenty of fluids, gentle stretching as discussed, alternate ice and heat °3. Follow Up: Please followup with your primary doctor in 3 days for discussion of your diagnoses and further evaluation after today's visit; if you do not have a primary care doctor use the resource guide provided to find one;  Return to the ER for worsening back pain, difficulty walking, loss of bowel or bladder control or other concerning symptoms ° ° ° °

## 2016-03-04 ENCOUNTER — Emergency Department (HOSPITAL_COMMUNITY)
Admission: EM | Admit: 2016-03-04 | Discharge: 2016-03-04 | Discharge status: Against Medical Advice | Payer: Medicaid - Out of State

## 2016-03-04 NOTE — ED Triage Notes (Signed)
Pt had to leave so she wouldn't lose her housing tonight, pt states she will come back in the morning

## 2016-03-27 ENCOUNTER — Emergency Department (HOSPITAL_COMMUNITY)
Admission: EM | Admit: 2016-03-27 | Discharge: 2016-03-27 | Disposition: A | Discharge status: Home / Self Care | Payer: Medicaid Other | Attending: Emergency Medicine | Admitting: Emergency Medicine

## 2016-03-27 ENCOUNTER — Encounter (HOSPITAL_COMMUNITY): Payer: Self-pay

## 2016-03-27 DIAGNOSIS — F17200 Nicotine dependence, unspecified, uncomplicated: Secondary | ICD-10-CM | POA: Insufficient documentation

## 2016-03-27 DIAGNOSIS — M791 Myalgia, unspecified site: Secondary | ICD-10-CM

## 2016-03-27 DIAGNOSIS — M549 Dorsalgia, unspecified: Secondary | ICD-10-CM | POA: Diagnosis present

## 2016-03-27 DIAGNOSIS — M545 Low back pain: Secondary | ICD-10-CM | POA: Diagnosis not present

## 2016-03-27 DIAGNOSIS — G8929 Other chronic pain: Secondary | ICD-10-CM

## 2016-03-27 DIAGNOSIS — Z91040 Latex allergy status: Secondary | ICD-10-CM | POA: Insufficient documentation

## 2016-03-27 LAB — CBC WITH DIFFERENTIAL/PLATELET
Basophils Absolute: 0.1 10*3/uL (ref 0.0–0.1)
Basophils Relative: 2 %
Eosinophils Absolute: 0.1 10*3/uL (ref 0.0–0.7)
Eosinophils Relative: 3 %
HCT: 34.1 % — ABNORMAL LOW (ref 36.0–46.0)
Hemoglobin: 10.7 g/dL — ABNORMAL LOW (ref 12.0–15.0)
Lymphocytes Relative: 35 %
Lymphs Abs: 1.8 10*3/uL (ref 0.7–4.0)
MCH: 26.2 pg (ref 26.0–34.0)
MCHC: 31.4 g/dL (ref 30.0–36.0)
MCV: 83.4 fL (ref 78.0–100.0)
Monocytes Absolute: 0.4 10*3/uL (ref 0.1–1.0)
Monocytes Relative: 7 %
Neutro Abs: 2.9 10*3/uL (ref 1.7–7.7)
Neutrophils Relative %: 53 %
Platelets: 292 10*3/uL (ref 150–400)
RBC: 4.09 MIL/uL (ref 3.87–5.11)
RDW: 15.3 % (ref 11.5–15.5)
WBC: 5.3 10*3/uL (ref 4.0–10.5)

## 2016-03-27 LAB — URINALYSIS, ROUTINE W REFLEX MICROSCOPIC
Bilirubin Urine: NEGATIVE
Glucose, UA: NEGATIVE mg/dL
Hgb urine dipstick: NEGATIVE
Ketones, ur: NEGATIVE mg/dL
Leukocytes, UA: NEGATIVE
Nitrite: NEGATIVE
Protein, ur: NEGATIVE mg/dL
Specific Gravity, Urine: 1.012 (ref 1.005–1.030)
pH: 5.5 (ref 5.0–8.0)

## 2016-03-27 LAB — BASIC METABOLIC PANEL
Anion gap: 5 (ref 5–15)
BUN: 6 mg/dL (ref 6–20)
CO2: 27 mmol/L (ref 22–32)
Calcium: 9.1 mg/dL (ref 8.9–10.3)
Chloride: 108 mmol/L (ref 101–111)
Creatinine, Ser: 0.91 mg/dL (ref 0.44–1.00)
GFR calc Af Amer: >60 mL/min (ref >60)
GFR calc non Af Amer: >60 mL/min (ref >60)
Glucose, Bld: 122 mg/dL — ABNORMAL HIGH (ref 65–99)
Potassium: 3.4 mmol/L — ABNORMAL LOW (ref 3.5–5.1)
Sodium: 140 mmol/L (ref 135–145)

## 2016-03-27 MED ORDER — OXYCODONE-ACETAMINOPHEN 5-325 MG PO TABS
1.0000 | ORAL_TABLET | Freq: Once | ORAL | Status: AC
  Administered 2016-03-27: 1 via ORAL
  Filled 2016-03-27: qty 1

## 2016-03-27 MED ORDER — OXYCODONE-ACETAMINOPHEN 5-325 MG PO TABS: 1.0000 | ORAL_TABLET | Freq: Four times a day (QID) | ORAL | 0 refills | Status: DC | PRN

## 2016-03-27 NOTE — ED Notes (Signed)
Pt sitting up listening to music asking for a gingerale appears in no distress family at bedside eating and conversing with pt.,

## 2016-03-27 NOTE — ED Triage Notes (Signed)
Pt c/o pain that started yesterday

## 2016-03-27 NOTE — ED Notes (Signed)
Pt is sleeping during my initial assessment though wakes up to answer questions

## 2016-03-27 NOTE — ED Notes (Signed)
PT removed her bp cuff stating it hurt and her back hurt and she was not going to wear it.

## 2016-03-27 NOTE — Discharge Instructions (Signed)
Follow-up with your primary care doctor.  Return here as needed °

## 2016-03-31 ENCOUNTER — Encounter (HOSPITAL_COMMUNITY): Payer: Self-pay | Admitting: Family Medicine

## 2016-03-31 ENCOUNTER — Ambulatory Visit (HOSPITAL_COMMUNITY)
Admission: EM | Admit: 2016-03-31 | Discharge: 2016-03-31 | Disposition: A | Discharge status: Home / Self Care | Payer: Medicaid Other | Attending: Family Medicine | Admitting: Family Medicine

## 2016-03-31 DIAGNOSIS — F1193 Opioid use, unspecified with withdrawal: Secondary | ICD-10-CM

## 2016-03-31 DIAGNOSIS — F1123 Opioid dependence with withdrawal: Secondary | ICD-10-CM

## 2016-03-31 MED ORDER — OXYCODONE-ACETAMINOPHEN 5-325 MG PO TABS: 1.0000 | ORAL_TABLET | Freq: Three times a day (TID) | ORAL | 0 refills | Status: DC | PRN

## 2016-03-31 NOTE — ED Triage Notes (Signed)
The patient presented to the [facility] with a complaint of N/V/D and generalized body aches and chills that started 4 days ago.

## 2016-03-31 NOTE — ED Provider Notes (Signed)
[facility]    CSN: [id number] Arrival date & time: 03/31/16  1309     History   Chief Complaint Chief Complaint  Patient presents with  . Nausea    HPI Kaitlyn Gray is a 40 y.o. female.   This 40 year old woman who presents with 4 days of nausea, vomiting, and diarrhea.  She's been seeing Dr. Ronne BinningMcKenzie for the past several years and he's been treating her for chronic pain with oxycodone 5 mg every 6 hours. She ran out last week and has not been able to get in a point with him until November.  Patient also is seeing Dr. Sharen HonesPavlik who works with her bipolar problem. She gets Abilify injections.      Past Medical History:  Diagnosis Date  . Bipolar 1 disorder [facility](HCC)     Patient Active Problem List   Diagnosis Date Noted  . Cocaine use disorder, severe, dependence (HCC) 03/24/2015  . Cannabis use disorder, moderate, dependence (HCC) 03/24/2015  . MDD (major depressive disorder), recurrent severe, 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    Past Surgical History:  Procedure Laterality Date  . CESAREAN SECTION      OB History    No data available       Home Medications    Prior to Admission medications   Medication Sig Start Date End Date Taking? Authorizing Provider  ARIPiprazole (ABILIFY IM) Inject 400 mg into the muscle every 30 (thirty) days. [facility].   Yes Historical Provider, MD  citalopram (CELEXA) 10 MG tablet Take 1 tablet (10 mg total) by mouth daily. Patient not taking: Reported on 03/27/2016 03/24/15   Beau FannyJohn C Withrow, FNP  divalproex (DEPAKOTE) 500 MG DR tablet Take 1 tablet (500 mg total) by mouth 2 (two) times daily. Patient not taking: Reported on 03/27/2016 03/24/15   Beau FannyJohn C Withrow, FNP  hydrOXYzine (ATARAX/VISTARIL) 50 MG tablet Take 1 tablet (50 mg total) by mouth 3 (three) times daily as needed for anxiety. Patient not taking: Reported on 03/27/2016 03/24/15   Beau FannyJohn C Withrow, FNP    lisinopril (PRINIVIL,ZESTRIL) 10 MG tablet Take 10 mg by mouth daily.    Historical Provider, MD  methocarbamol (ROBAXIN) 500 MG tablet Take 1 tablet (500 mg total) by mouth 2 (two) times daily. Patient not taking: Reported on 03/27/2016 02/12/16   Dahlia ClientHannah Muthersbaugh, PA-C  naproxen (NAPROSYN) 500 MG tablet Take 1 tablet (500 mg total) by mouth 2 (two) times daily with a meal. Patient not taking: Reported on 03/27/2016 02/12/16   Dahlia ClientHannah Muthersbaugh, PA-C  oxyCODONE-acetaminophen (PERCOCET/ROXICET) 5-325 MG tablet Take 1 tablet by mouth every 8 (eight) hours as needed for severe pain. 03/31/16   Elvina SidleKurt Johnnye Sandford, MD  promethazine (PHENERGAN) 25 MG tablet Take 25 mg by mouth every 6 (six) hours as needed for nausea or vomiting.    Historical Provider, MD  traZODone (DESYREL) 100 MG tablet Take 1 tablet (100 mg total) by mouth at bedtime as needed for sleep. Patient not taking: Reported on 03/27/2016 03/24/15   Beau FannyJohn C Withrow, FNP  zolpidem (AMBIEN) 10 MG tablet Take 10 mg by mouth at bedtime as needed for sleep.    Historical Provider, MD    Family History History reviewed. No pertinent family history.  Social History Social History  Substance Use Topics  . Smoking status: Current Every Day Smoker  . Smokeless tobacco: Never Used  . Alcohol use No     Allergies   Hydrocodone and Latex  Review of Systems Review of Systems  Constitutional: Positive for appetite change, chills and diaphoresis. Negative for fatigue, fever and unexpected weight change.  Eyes: Negative.   Respiratory: Negative.   Cardiovascular: Negative.   Gastrointestinal: Positive for diarrhea, nausea and vomiting.  Genitourinary: Negative.   Musculoskeletal: Positive for myalgias.  Neurological: Positive for light-headedness and headaches. Negative for dizziness, seizures, syncope, speech difficulty and weakness.     Physical Exam Triage Vital Signs ED Triage Vitals  Enc Vitals Group     BP 03/31/16 1435 (!)  162/110     Pulse Rate 03/31/16 1435 66     Resp 03/31/16 1435 14     Temp 03/31/16 1435 97.7 F (36.5 C)     Temp Source 03/31/16 1435 Oral     SpO2 03/31/16 1435 100 %     Weight --      Height --      Head Circumference --      Peak Flow --      Pain Score 03/31/16 1500 10     Pain Loc --      Pain Edu? --      Excl. in GC? --    No data found.   Updated Vital Signs BP (!) 162/110 (BP Location: Left Arm) Comment: rn notified  Pulse 66   Temp 97.7 F (36.5 C) (Oral)   Resp 14   LMP 03/04/2016 (Exact Date)   SpO2 100%   Visual Acuity Right Eye Distance:   Left Eye Distance:   Bilateral Distance:    Right Eye Near:   Left Eye Near:    Bilateral Near:     Physical Exam  Constitutional: She is oriented to person, place, and time. She appears well-developed and well-nourished.  HENT:  Head: Normocephalic.  Right Ear: External ear normal.  Left Ear: External ear normal.  Nose: Nose normal.  Mouth/Throat: Oropharynx is clear and moist.  Eyes: Conjunctivae and EOM are normal. Pupils are equal, round, and reactive to light.  Neck: Normal range of motion. Neck supple.  Cardiovascular: Normal rate, regular rhythm and normal heart sounds.   Pulmonary/Chest: Effort normal and breath sounds normal.  Abdominal: Soft. Bowel sounds are normal.  Neurological: She is alert and oriented to person, place, and time.  Skin: Skin is warm and dry.  Nursing note and vitals reviewed.    UC Treatments / Results  Labs (all labs ordered are listed, but only abnormal results are displayed) Labs Reviewed - No data to display  EKG  EKG Interpretation None       Radiology No results found.  Procedures Procedures (including critical care time)  Medications Ordered in UC Medications - No data to display   Initial Impression / Assessment and Plan / UC Course  I have reviewed the triage vital signs and the nursing notes.  Pertinent labs & imaging results that were  available during my care of the patient were reviewed by me and considered in my medical decision making (see chart for details).  Clinical Course    Final Clinical Impressions(s) / UC Diagnoses   Final diagnoses:  Opiate withdrawal [facility])    New Prescriptions Current Discharge Medication List    I suggested the patient get her doctors to refer her to a pain clinic. I pointed out that she is going to be withdrawn that's the source of her pain, nausea, vomiting, and diarrhea. As seen in the prescription section, I gave patient 30 oxycodone and asked her to  take them every 8 hours instead of every 6 hours to control withdrawal. It's her responsibility to discuss with her primary care doctors how to get into a pain clinic.   Elvina Sidle, MD 03/31/16 [phone]

## 2016-03-31 NOTE — Discharge Instructions (Signed)
You need to be treated by a pain clinic.  Dr. Jeri Lager may be able to get you set up with a pain clinic.

## 2016-04-01 ENCOUNTER — Emergency Department (HOSPITAL_COMMUNITY)
Admission: EM | Admit: 2016-04-01 | Discharge: 2016-04-01 | Discharge status: Against Medical Advice | Payer: Medicaid Other | Attending: Emergency Medicine | Admitting: Emergency Medicine

## 2016-04-01 ENCOUNTER — Encounter (HOSPITAL_COMMUNITY): Payer: Self-pay | Admitting: Emergency Medicine

## 2016-04-01 DIAGNOSIS — R55 Syncope and collapse: Secondary | ICD-10-CM | POA: Diagnosis present

## 2016-04-01 DIAGNOSIS — F17200 Nicotine dependence, unspecified, uncomplicated: Secondary | ICD-10-CM | POA: Diagnosis not present

## 2016-04-01 DIAGNOSIS — Z91040 Latex allergy status: Secondary | ICD-10-CM | POA: Insufficient documentation

## 2016-04-01 LAB — COMPREHENSIVE METABOLIC PANEL
ALBUMIN: 3.5 g/dL (ref 3.5–5.0)
ALT: 12 U/L — AB (ref 14–54)
ANION GAP: 8 (ref 5–15)
AST: 16 U/L (ref 15–41)
Alkaline Phosphatase: 63 U/L (ref 38–126)
BUN: 8 mg/dL (ref 6–20)
CHLORIDE: 106 mmol/L (ref 101–111)
CO2: 29 mmol/L (ref 22–32)
CREATININE: 1.15 mg/dL — AB (ref 0.44–1.00)
Calcium: 9.6 mg/dL (ref 8.9–10.3)
GFR calc non Af Amer: 58 mL/min — ABNORMAL LOW (ref >60)
GLUCOSE: 122 mg/dL — AB (ref 65–99)
Potassium: 3.4 mmol/L — ABNORMAL LOW (ref 3.5–5.1)
SODIUM: 143 mmol/L (ref 135–145)
Total Bilirubin: 0.3 mg/dL (ref 0.3–1.2)
Total Protein: 6.6 g/dL (ref 6.5–8.1)

## 2016-04-01 LAB — CBC WITH DIFFERENTIAL/PLATELET
BASOS ABS: 0 10*3/uL (ref 0.0–0.1)
BASOS PCT: 0 %
EOS ABS: 0.1 10*3/uL (ref 0.0–0.7)
EOS PCT: 2 %
HCT: 34.4 % — ABNORMAL LOW (ref 36.0–46.0)
HEMOGLOBIN: 10.9 g/dL — AB (ref 12.0–15.0)
LYMPHS ABS: 2 10*3/uL (ref 0.7–4.0)
Lymphocytes Relative: 31 %
MCH: 26.3 pg (ref 26.0–34.0)
MCHC: 31.7 g/dL (ref 30.0–36.0)
MCV: 82.9 fL (ref 78.0–100.0)
Monocytes Absolute: 0.4 10*3/uL (ref 0.1–1.0)
Monocytes Relative: 6 %
NEUTROS PCT: 61 %
Neutro Abs: 3.8 10*3/uL (ref 1.7–7.7)
PLATELETS: 305 10*3/uL (ref 150–400)
RBC: 4.15 MIL/uL (ref 3.87–5.11)
RDW: 15.3 % (ref 11.5–15.5)
WBC: 6.4 10*3/uL (ref 4.0–10.5)

## 2016-04-01 LAB — I-STAT TROPONIN, ED: Troponin i, poc: 0 ng/mL (ref 0.00–0.08)

## 2016-04-01 LAB — I-STAT BETA HCG BLOOD, ED (MC, WL, AP ONLY)

## 2016-04-01 LAB — ETHANOL: Alcohol, Ethyl (B): <5 mg/dL (ref <5)

## 2016-04-01 LAB — CBG MONITORING, ED: GLUCOSE-CAPILLARY: 180 mg/dL — AB (ref 65–99)

## 2016-04-01 MED ORDER — SODIUM CHLORIDE 0.9 % IV BOLUS (SEPSIS)
1000.0000 mL | Freq: Once | INTRAVENOUS | Status: AC
  Administered 2016-04-01: 1000 mL via INTRAVENOUS

## 2016-04-01 NOTE — ED Notes (Signed)
Pt stated she needed to use bathroom. Pt taken by stretcher. 2 person assist when ambulating.  Pt unsteady and sleepy. Pt went to sleep as soon as she laid down.

## 2016-04-01 NOTE — ED Notes (Signed)
Pt getting off of stretcher and stating "I need to leave." Pt refusing EKG and orthostatic vitals. Lemont Fillersatyana, PA notified. Tayana, PA spoke to pt about leaving and pt adamant about leaving. Pt leaving AMA.

## 2016-04-01 NOTE — ED Notes (Signed)
Pt refused EKG. Lemont Fillersatyana, PA notified.

## 2016-04-01 NOTE — ED Triage Notes (Signed)
Pt here with daughter and sts felt weak and stood up with syncope; pt on floor; pt placed on back board and transferred to stretcher for eval

## 2016-04-01 NOTE — ED Provider Notes (Signed)
[facility] Provider Note   CSN: [id number] Arrival date & time: 03/27/16  [id number]     History   Chief Complaint Chief Complaint  Patient presents with  . Back Pain    pt c/o pain all over though increased in her back area    HPI Kaitlyn Gray LevelsFrazier is a 40 y.o. female.  HPI Patient presents to the emergency department with Back pain.  Patient will not answer all my questions.  She will arouse to answer some of them.  She states that she has not feel like answering all my questions.  The patient states she has not had any fever, nausea, vomiting, abdominal pain, chest pain, shortness of breath, headache, blurred vision, weakness, numbness, gait disturbance or syncope.  She states that when she has back pain like this.  She normally takes pain medications and they relieve her symptoms.  She states that she normally gets pain medications from her primary doctor Past Medical History:  Diagnosis Date  . Bipolar 1 disorder [facility](HCC)     Patient Active Problem List   Diagnosis Date Noted  . Cocaine use disorder, severe, dependence (HCC) 03/24/2015  . Cannabis use disorder, moderate, dependence (HCC) 03/24/2015  . MDD (major depressive disorder), recurrent severe, 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    Past Surgical History:  Procedure Laterality Date  . CESAREAN SECTION      OB History    No data available       Home Medications    Prior to Admission medications   Medication Sig Start Date End Date Taking? Authorizing Provider  ARIPiprazole (ABILIFY IM) Inject 400 mg into the muscle every 30 (thirty) days. [facility].   Yes Historical Provider, MD  lisinopril (PRINIVIL,ZESTRIL) 10 MG tablet Take 10 mg by mouth daily.   Yes Historical Provider, MD  promethazine (PHENERGAN) 25 MG tablet Take 25 mg by mouth every 6 (six) hours as needed for nausea or vomiting.   Yes Historical Provider, MD  zolpidem (AMBIEN) 10 MG  tablet Take 10 mg by mouth at bedtime as needed for sleep.   Yes Historical Provider, MD  citalopram (CELEXA) 10 MG tablet Take 1 tablet (10 mg total) by mouth daily. Patient not taking: Reported on 03/27/2016 03/24/15   Beau FannyJohn C Withrow, FNP  divalproex (DEPAKOTE) 500 MG DR tablet Take 1 tablet (500 mg total) by mouth 2 (two) times daily. Patient not taking: Reported on 03/27/2016 03/24/15   Beau FannyJohn C Withrow, FNP  hydrOXYzine (ATARAX/VISTARIL) 50 MG tablet Take 1 tablet (50 mg total) by mouth 3 (three) times daily as needed for anxiety. Patient not taking: Reported on 03/27/2016 03/24/15   Beau FannyJohn C Withrow, FNP  methocarbamol (ROBAXIN) 500 MG tablet Take 1 tablet (500 mg total) by mouth 2 (two) times daily. Patient not taking: Reported on 03/27/2016 02/12/16   Dahlia ClientHannah Muthersbaugh, PA-C  naproxen (NAPROSYN) 500 MG tablet Take 1 tablet (500 mg total) by mouth 2 (two) times daily with a meal. Patient not taking: Reported on 03/27/2016 02/12/16   Dahlia ClientHannah Muthersbaugh, PA-C  oxyCODONE-acetaminophen (PERCOCET/ROXICET) 5-325 MG tablet Take 1 tablet by mouth every 8 (eight) hours as needed for severe pain. 03/31/16   Elvina SidleKurt Lauenstein, MD  traZODone (DESYREL) 100 MG tablet Take 1 tablet (100 mg total) by mouth at bedtime as needed for sleep. Patient not taking: Reported on 03/27/2016 03/24/15   Beau FannyJohn C Withrow, FNP    Family History No family history on file.  Social History  Social History  Substance Use Topics  . Smoking status: Current Every Day Smoker  . Smokeless tobacco: Never Used  . Alcohol use No     Allergies   Hydrocodone and Latex   Review of Systems Review of Systems All other systems negative except as documented in the HPI. All pertinent positives and negatives as reviewed in the HPI.  Physical Exam Updated Vital Signs BP (!) 157/106 (BP Location: Left Arm)   Pulse 70   Temp 98.3 F (36.8 C) (Oral)   Resp 14   Ht 5\' 7"  (1.702 m)   Wt 95.3 kg   SpO2 98%   BMI 32.89 kg/m    Physical Exam  Constitutional: She is oriented to person, place, and time. She appears well-developed and well-nourished. No distress.  HENT:  Head: Normocephalic and atraumatic.  Mouth/Throat: Oropharynx is clear and moist.  Eyes: Pupils are equal, round, and reactive to light.  Neck: Normal range of motion. Neck supple.  Cardiovascular: Normal rate, regular rhythm and normal heart sounds.  Exam reveals no gallop and no friction rub.   No murmur heard. Pulmonary/Chest: Effort normal and breath sounds normal. No respiratory distress. She has no wheezes.  Abdominal: Soft. Bowel sounds are normal. She exhibits no distension. There is no tenderness.  Musculoskeletal:       Back:  Neurological: She is alert and oriented to person, place, and time. She exhibits normal muscle tone. Coordination normal.  Skin: Skin is warm and dry. No rash noted. No erythema.  Psychiatric: She has a normal mood and affect. Her behavior is normal.  Nursing note and vitals reviewed.    ED Treatments / Results  Labs (all labs ordered are listed, but only abnormal results are displayed) Labs Reviewed  BASIC METABOLIC PANEL - Abnormal; Notable for the following:       Result Value   Potassium 3.4 (*)    Glucose, Bld 122 (*)    All other components within normal limits  CBC WITH DIFFERENTIAL/PLATELET - Abnormal; Notable for the following:    Hemoglobin 10.7 (*)    HCT 34.1 (*)    All other components within normal limits  URINALYSIS, ROUTINE W REFLEX MICROSCOPIC (NOT AT [facility])    EKG  EKG Interpretation None       Radiology No results found.  Procedures Procedures (including critical care time)  Medications Ordered in ED Medications  oxyCODONE-acetaminophen (PERCOCET/ROXICET) 5-325 MG per tablet 1 tablet (1 tablet Oral Given 03/27/16 1019)     Initial Impression / Assessment and Plan / ED Course  I have reviewed the triage vital signs and the nursing notes.  Pertinent labs & imaging  results that were available during my care of the patient were reviewed by me and considered in my medical decision making (see chart for details).  Clinical Course    Patient is treated for the pain in her back.  Basic laboratory testing does not show any significant abnormality.  Patient does not have any neurological deficits noted on exam.  She has normal reflexes.  Patient is feeling better and will be discharged home  Final Clinical Impressions(s) / ED Diagnoses   Final diagnoses:  Chronic low back pain without sciatica, unspecified back pain laterality  Myalgia    New Prescriptions Discharge Medication List as of 03/27/2016 11:52 AM    START taking these medications   Details  oxyCODONE-acetaminophen (PERCOCET/ROXICET) 5-325 MG tablet Take 1 tablet by mouth every 6 (six) hours as needed for severe pain.,  Starting Thu 03/27/2016, Print         Charlestine Night, PA-C 04/01/16 1453    Benjiman Core, MD 04/01/16 [phone]

## 2016-04-01 NOTE — ED Provider Notes (Signed)
[facility]-EMER[facility]Y DEPT Provider Note   CSN: [id number] Arrival date & time: 04/01/16  1045     History   Chief Complaint Chief Complaint  Patient presents with  . Loss of Consciousness    HPI Kaitlyn Gray is a 40 y.o. female.  HPI Kaitlyn Gray is a 40 y.o. female with history of bipolar disorder, cocaine abuse, depression, presents to emergency department after syncopal episode. Patient states she was here in the hospital with her daughter, who has had nausea, vomiting, diarrhea, and states while in the room with her, she stood up to throw something in a trash can, remembers feeling dizzy, and next thing she remembers is waking up on the floor. She reports prior history of syncopal episodes. She does complain of chronic back pain but states it is not any worse than normal. She denies any headache. No chest pain or shortness of breath. No nausea or vomiting. Denies being pregnant. She states that she has had diarrhea, and reports being up all that with her daughter, getting no sleep. She states that she might have just gotten very tired. She reports no other associated symptoms.  Past Medical History:  Diagnosis Date  . Bipolar 1 disorder [facility])     Patient Active Problem List   Diagnosis Date Noted  . Cocaine use disorder, severe, dependence (HCC) 03/24/2015  . Cannabis use disorder, moderate, dependence (HCC) 03/24/2015  . MDD (major depressive disorder), recurrent severe, 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    Past Surgical History:  Procedure Laterality Date  . CESAREAN SECTION      OB History    No data available       Home Medications    Prior to Admission medications   Medication Sig Start Date End Date Taking? Authorizing Provider  ARIPiprazole (ABILIFY IM) Inject 400 mg into the muscle every 30 (thirty) days. patient gets this injection at [facility] center.    Historical Provider, MD  citalopram (CELEXA) 10 MG tablet Take 1 tablet (10  mg total) by mouth daily. Patient not taking: Reported on 03/27/2016 03/24/15   Beau Fanny, FNP  divalproex (DEPAKOTE) 500 MG DR tablet Take 1 tablet (500 mg total) by mouth 2 (two) times daily. Patient not taking: Reported on 03/27/2016 03/24/15   Beau Fanny, FNP  hydrOXYzine (ATARAX/VISTARIL) 50 MG tablet Take 1 tablet (50 mg total) by mouth 3 (three) times daily as needed for anxiety. Patient not taking: Reported on 03/27/2016 03/24/15   Beau Fanny, FNP  lisinopril (PRINIVIL,ZESTRIL) 10 MG tablet Take 10 mg by mouth daily.    Historical Provider, MD  methocarbamol (ROBAXIN) 500 MG tablet Take 1 tablet (500 mg total) by mouth 2 (two) times daily. Patient not taking: Reported on 03/27/2016 02/12/16   Dahlia Client Muthersbaugh, PA-C  naproxen (NAPROSYN) 500 MG tablet Take 1 tablet (500 mg total) by mouth 2 (two) times daily with a meal. Patient not taking: Reported on 03/27/2016 02/12/16   Dahlia Client Muthersbaugh, PA-C  oxyCODONE-acetaminophen (PERCOCET/ROXICET) 5-325 MG tablet Take 1 tablet by mouth every 8 (eight) hours as needed for severe pain. 03/31/16   Elvina Sidle, MD  promethazine (PHENERGAN) 25 MG tablet Take 25 mg by mouth every 6 (six) hours as needed for nausea or vomiting.    Historical Provider, MD  traZODone (DESYREL) 100 MG tablet Take 1 tablet (100 mg total) by mouth at bedtime as needed for sleep. Patient not taking: Reported on 03/27/2016 03/24/15   Beau Fanny, FNP  zolpidem Remus Loffler)  10 MG tablet Take 10 mg by mouth at bedtime as needed for sleep.    Historical Provider, MD    Family History History reviewed. No pertinent family history.  Social History Social History  Substance Use Topics  . Smoking status: Current Every Day Smoker  . Smokeless tobacco: Never Used  . Alcohol use No     Allergies   Hydrocodone and Latex   Review of Systems Review of Systems  Constitutional: Positive for fatigue. Negative for chills and fever.  Respiratory: Negative for  cough, chest tightness and shortness of breath.   Cardiovascular: Negative for chest pain, palpitations and leg swelling.  Gastrointestinal: Positive for diarrhea. Negative for abdominal pain, nausea and vomiting.  Genitourinary: Negative for dysuria, flank pain, pelvic pain, vaginal bleeding, vaginal discharge and vaginal pain.  Musculoskeletal: Positive for arthralgias, back pain and myalgias. Negative for neck pain and neck stiffness.  Skin: Negative for rash.  Neurological: Positive for syncope. Negative for dizziness, weakness and headaches.  All other systems reviewed and are negative.    Physical Exam Updated Vital Signs BP 146/89 (BP Location: Right Arm)   Pulse 72   Temp 98.4 F (36.9 C)   Resp 24   LMP 03/04/2016 (Exact Date)   SpO2 97%   Physical Exam  Constitutional: She is oriented to person, place, and time. She appears well-developed and well-nourished. No distress.  Appears somnolent  HENT:  Head: Normocephalic.  Eyes: Conjunctivae are normal.  Neck: Neck supple.  Cardiovascular: Normal rate, regular rhythm and normal heart sounds.   Pulmonary/Chest: Effort normal and breath sounds normal. No respiratory distress. She has no wheezes. She has no rales.  Abdominal: Soft. Bowel sounds are normal. She exhibits no distension. There is no tenderness. There is no rebound.  Musculoskeletal: She exhibits no edema.  Neurological: She is alert and oriented to person, place, and time. No cranial nerve deficit.  Skin: Skin is warm and dry.  Psychiatric: She has a normal mood and affect. Her behavior is normal.  Nursing note and vitals reviewed.    ED Treatments / Results  Labs (all labs ordered are listed, but only abnormal results are displayed) Labs Reviewed  CBC WITH DIFFERENTIAL/PLATELET - Abnormal; Notable for the following:       Result Value   Hemoglobin 10.9 (*)    HCT 34.4 (*)    All other components within normal limits  COMPREHENSIVE METABOLIC PANEL -  Abnormal; Notable for the following:    Potassium 3.4 (*)    Glucose, Bld 122 (*)    Creatinine, Ser 1.15 (*)    ALT 12 (*)    GFR calc non Af Amer 58 (*)    All other components within normal limits  CBG MONITORING, ED - Abnormal; Notable for the following:    Glucose-Capillary 180 (*)    All other components within normal limits  ETHANOL  URINALYSIS, ROUTINE W REFLEX MICROSCOPIC (NOT AT [facility])  RAPID URINE DRUG SCREEN, HOSP PERFORMED  I-STAT BETA HCG BLOOD, ED ([facility], [facility], [facility] ONLY)  I-STAT TROPOININ, ED    EKG  EKG Interpretation None       Radiology No results found.  Procedures Procedures (including critical care time)  Medications Ordered in ED Medications  sodium chloride 0.9 % bolus 1,000 mL (not administered)     Initial Impression / Assessment and Plan / ED Course  I have reviewed the triage vital signs and the nursing notes.  Pertinent labs & imaging results that were available  during my care of the patient were reviewed by me and considered in my medical decision making (see chart for details).  Clinical Course   Pt with episode of syncope, while in ED with his daughter. Pt is now alert but appears to be somnolent. States it is because she was up all night with her daughter who has a stomach bug. VS normal other than mild hypertension. Will check ECG, labs, urine.   Pt refused ECG. She is not pregnant. Refused UA. She did receive 1L of NS through IV. Pt was just seen here overnight for vomiting but states that improved. Tried to leave AMA several times while here today. I was able to talk to her into staying twice to finish work up, each time, she was willing to stay, however nurse just informed me pt decided to leave again and left. RN informed pt of risks of leaving.    Vitals:   04/01/16 1055 04/01/16 1112  BP: 152/88 146/89  Pulse: 77 72  Resp: (!) 28 24  Temp:  98.4 F (36.9 C)  SpO2: 100% 97%     Final Clinical Impressions(s) / ED Diagnoses    Final diagnoses:  Syncope, unspecified syncope type    New Prescriptions New Prescriptions   No medications on file     Jaynie Crumbleatyana Elohim Brune, PA-C 04/01/16 1440    Heide Scaleshristopher J Tegeler, MD 04/01/16 1857

## 2016-04-01 NOTE — ED Notes (Signed)
Green top sent down to main lab for ethanol level.

## 2016-04-15 ENCOUNTER — Encounter (HOSPITAL_COMMUNITY): Payer: Self-pay | Admitting: Emergency Medicine

## 2016-04-15 ENCOUNTER — Emergency Department (HOSPITAL_COMMUNITY): Payer: Medicaid Other

## 2016-04-15 ENCOUNTER — Emergency Department (HOSPITAL_COMMUNITY)
Admission: EM | Admit: 2016-04-15 | Discharge: 2016-04-15 | Disposition: A | Discharge status: Home / Self Care | Payer: Medicaid Other | Attending: Emergency Medicine | Admitting: Emergency Medicine

## 2016-04-15 DIAGNOSIS — H6691 Otitis media, unspecified, right ear: Secondary | ICD-10-CM | POA: Diagnosis not present

## 2016-04-15 DIAGNOSIS — R0789 Other chest pain: Secondary | ICD-10-CM | POA: Diagnosis not present

## 2016-04-15 DIAGNOSIS — W19XXXA Unspecified fall, initial encounter: Secondary | ICD-10-CM | POA: Diagnosis not present

## 2016-04-15 DIAGNOSIS — D500 Iron deficiency anemia secondary to blood loss (chronic): Secondary | ICD-10-CM | POA: Diagnosis not present

## 2016-04-15 DIAGNOSIS — Y999 Unspecified external cause status: Secondary | ICD-10-CM | POA: Insufficient documentation

## 2016-04-15 DIAGNOSIS — Y9301 Activity, walking, marching and hiking: Secondary | ICD-10-CM | POA: Insufficient documentation

## 2016-04-15 DIAGNOSIS — F17200 Nicotine dependence, unspecified, uncomplicated: Secondary | ICD-10-CM | POA: Diagnosis not present

## 2016-04-15 DIAGNOSIS — R55 Syncope and collapse: Secondary | ICD-10-CM | POA: Diagnosis present

## 2016-04-15 DIAGNOSIS — I10 Essential (primary) hypertension: Secondary | ICD-10-CM | POA: Diagnosis not present

## 2016-04-15 DIAGNOSIS — Z79899 Other long term (current) drug therapy: Secondary | ICD-10-CM | POA: Insufficient documentation

## 2016-04-15 DIAGNOSIS — Z91040 Latex allergy status: Secondary | ICD-10-CM | POA: Insufficient documentation

## 2016-04-15 DIAGNOSIS — Y929 Unspecified place or not applicable: Secondary | ICD-10-CM | POA: Diagnosis not present

## 2016-04-15 DIAGNOSIS — H6690 Otitis media, unspecified, unspecified ear: Secondary | ICD-10-CM

## 2016-04-15 HISTORY — DX: Essential (primary) hypertension: I10

## 2016-04-15 LAB — RAPID URINE DRUG SCREEN, HOSP PERFORMED
AMPHETAMINES: NOT DETECTED
Barbiturates: NOT DETECTED
Benzodiazepines: NOT DETECTED
COCAINE: POSITIVE — AB
OPIATES: NOT DETECTED
TETRAHYDROCANNABINOL: POSITIVE — AB

## 2016-04-15 LAB — CBC
HEMATOCRIT: 31.8 % — AB (ref 36.0–46.0)
HEMOGLOBIN: 9.9 g/dL — AB (ref 12.0–15.0)
MCH: 25.9 pg — ABNORMAL LOW (ref 26.0–34.0)
MCHC: 31.1 g/dL (ref 30.0–36.0)
MCV: 83.2 fL (ref 78.0–100.0)
Platelets: 285 10*3/uL (ref 150–400)
RBC: 3.82 MIL/uL — ABNORMAL LOW (ref 3.87–5.11)
RDW: 15.7 % — AB (ref 11.5–15.5)
WBC: 5.1 10*3/uL (ref 4.0–10.5)

## 2016-04-15 LAB — I-STAT TROPONIN, ED: Troponin i, poc: 0 ng/mL (ref 0.00–0.08)

## 2016-04-15 LAB — CBG MONITORING, ED: Glucose-Capillary: 107 mg/dL — ABNORMAL HIGH (ref 65–99)

## 2016-04-15 LAB — URINALYSIS, ROUTINE W REFLEX MICROSCOPIC
BILIRUBIN URINE: NEGATIVE
GLUCOSE, UA: NEGATIVE mg/dL
KETONES UR: NEGATIVE mg/dL
Leukocytes, UA: NEGATIVE
Nitrite: NEGATIVE
PH: 6 (ref 5.0–8.0)
Protein, ur: 30 mg/dL — AB
SPECIFIC GRAVITY, URINE: 1.015 (ref 1.005–1.030)

## 2016-04-15 LAB — BASIC METABOLIC PANEL
ANION GAP: 4 — AB (ref 5–15)
BUN: 14 mg/dL (ref 6–20)
CHLORIDE: 109 mmol/L (ref 101–111)
CO2: 28 mmol/L (ref 22–32)
Calcium: 8.9 mg/dL (ref 8.9–10.3)
Creatinine, Ser: 0.97 mg/dL (ref 0.44–1.00)
GFR calc Af Amer: >60 mL/min (ref >60)
GFR calc non Af Amer: >60 mL/min (ref >60)
GLUCOSE: 104 mg/dL — AB (ref 65–99)
POTASSIUM: 3.7 mmol/L (ref 3.5–5.1)
Sodium: 141 mmol/L (ref 135–145)

## 2016-04-15 LAB — URINE MICROSCOPIC-ADD ON: WBC UA: NONE SEEN WBC/hpf (ref 0–5)

## 2016-04-15 IMAGING — DX DG CHEST 2V
2 series · 2 of 2 positions shown · non-contrast
Comparison: [DATE]

CLINICAL DATA: Recent fall with chest pain, initial encounter

EXAM:
CHEST  2 VIEW

[chest pa]
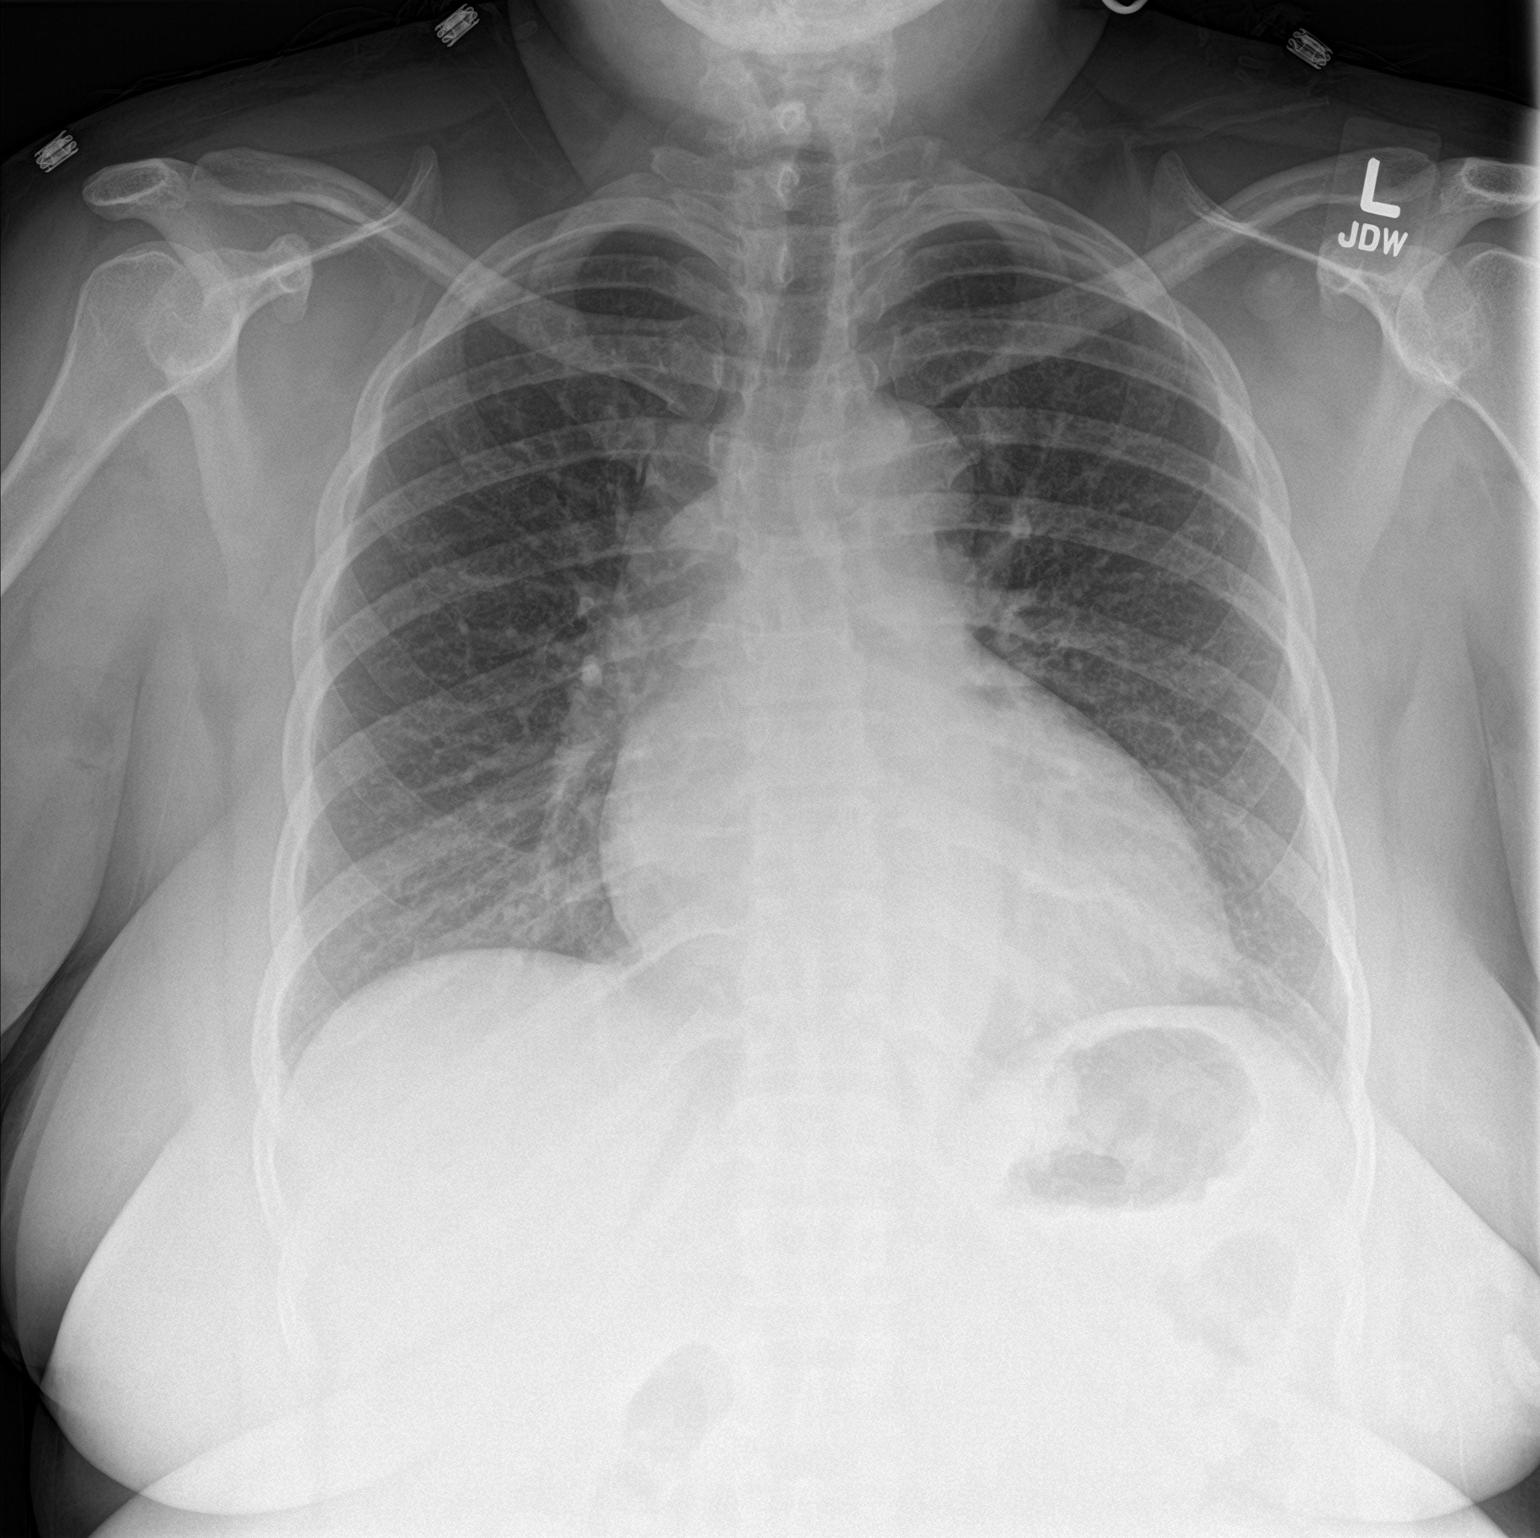

[chest lat]
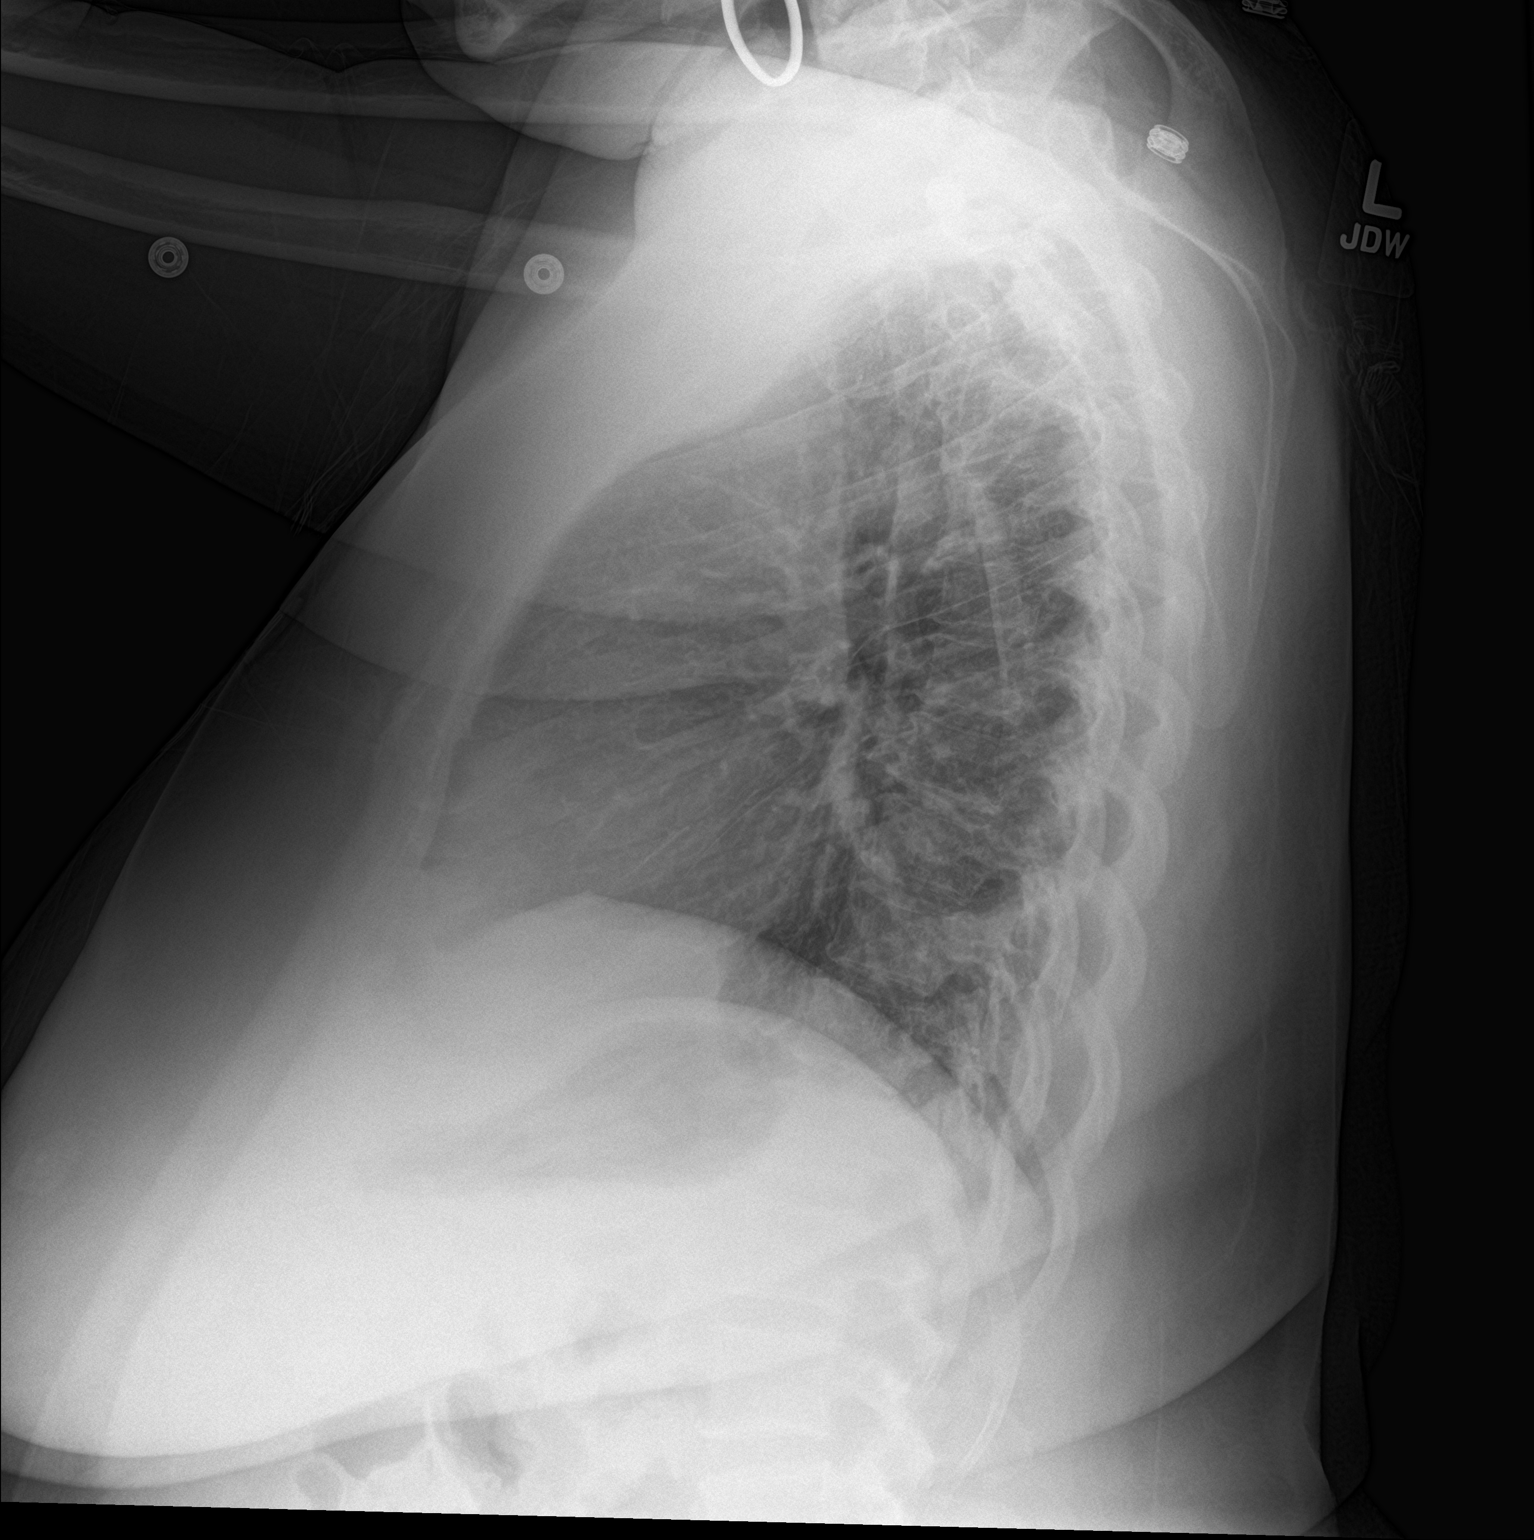

[2 of 2 positions shown; findings below may reference images not displayed]

FINDINGS: Cardiac shadow is mildly enlarged. The lungs are clear bilaterally.
No acute bony abnormality is seen.
IMPRESSION: No acute abnormality noted.

## 2016-04-15 IMAGING — DX DG KNEE COMPLETE 4+V*L*
4 series · 4 of 4 positions shown · non-contrast
Comparison: None.

CLINICAL DATA: Recent fall with knee pain, initial encounter

EXAM:
LEFT KNEE - COMPLETE 4+ VIEW

[knee ap]
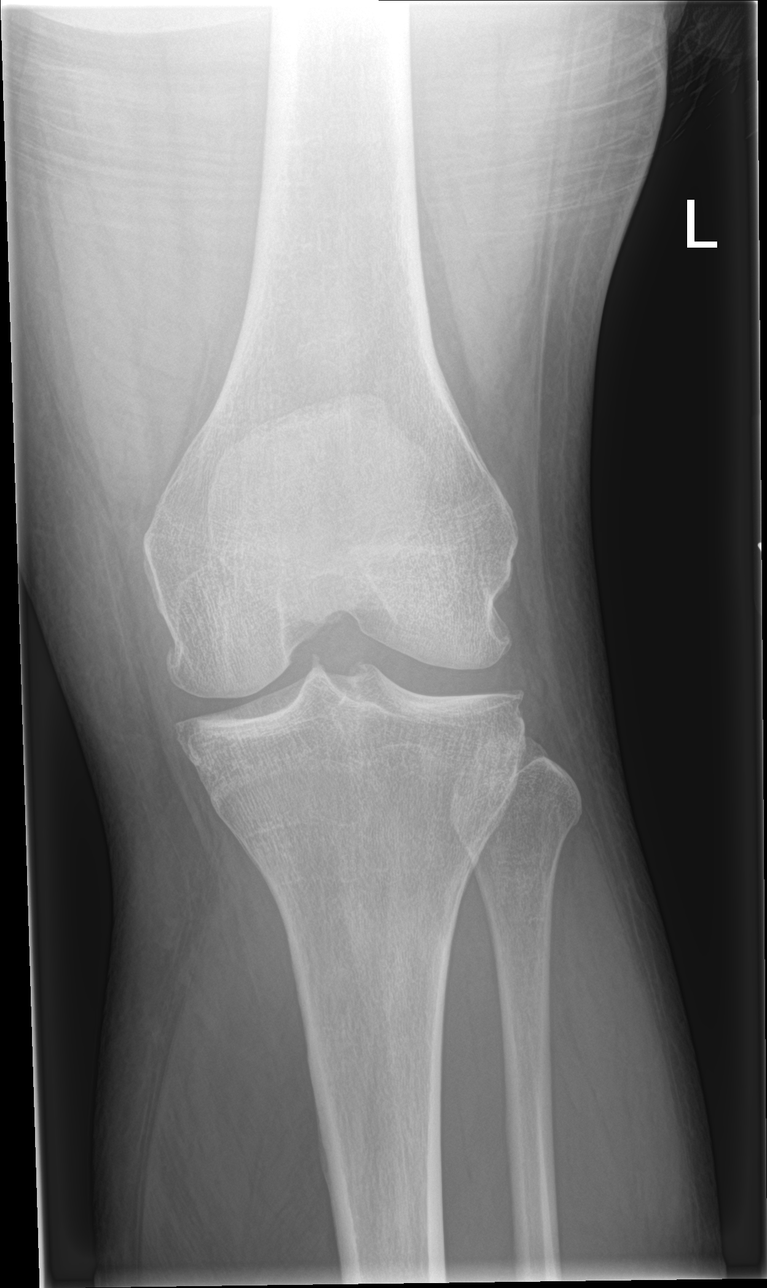

[knee lat]
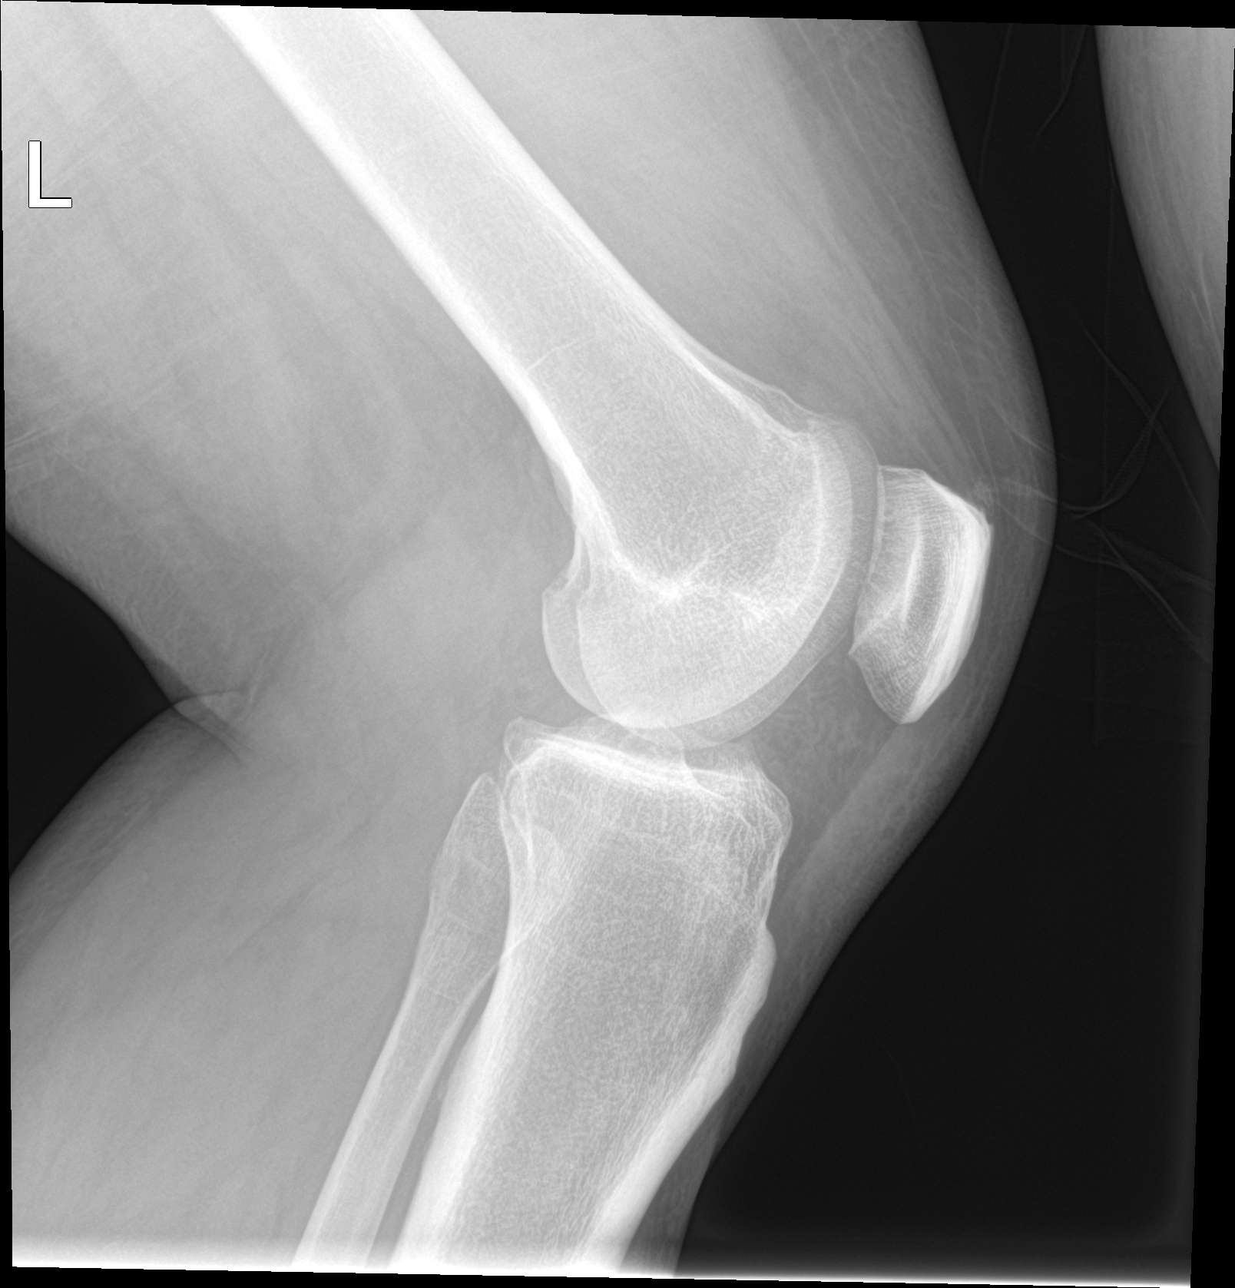

[knee obl (1 of 2)]
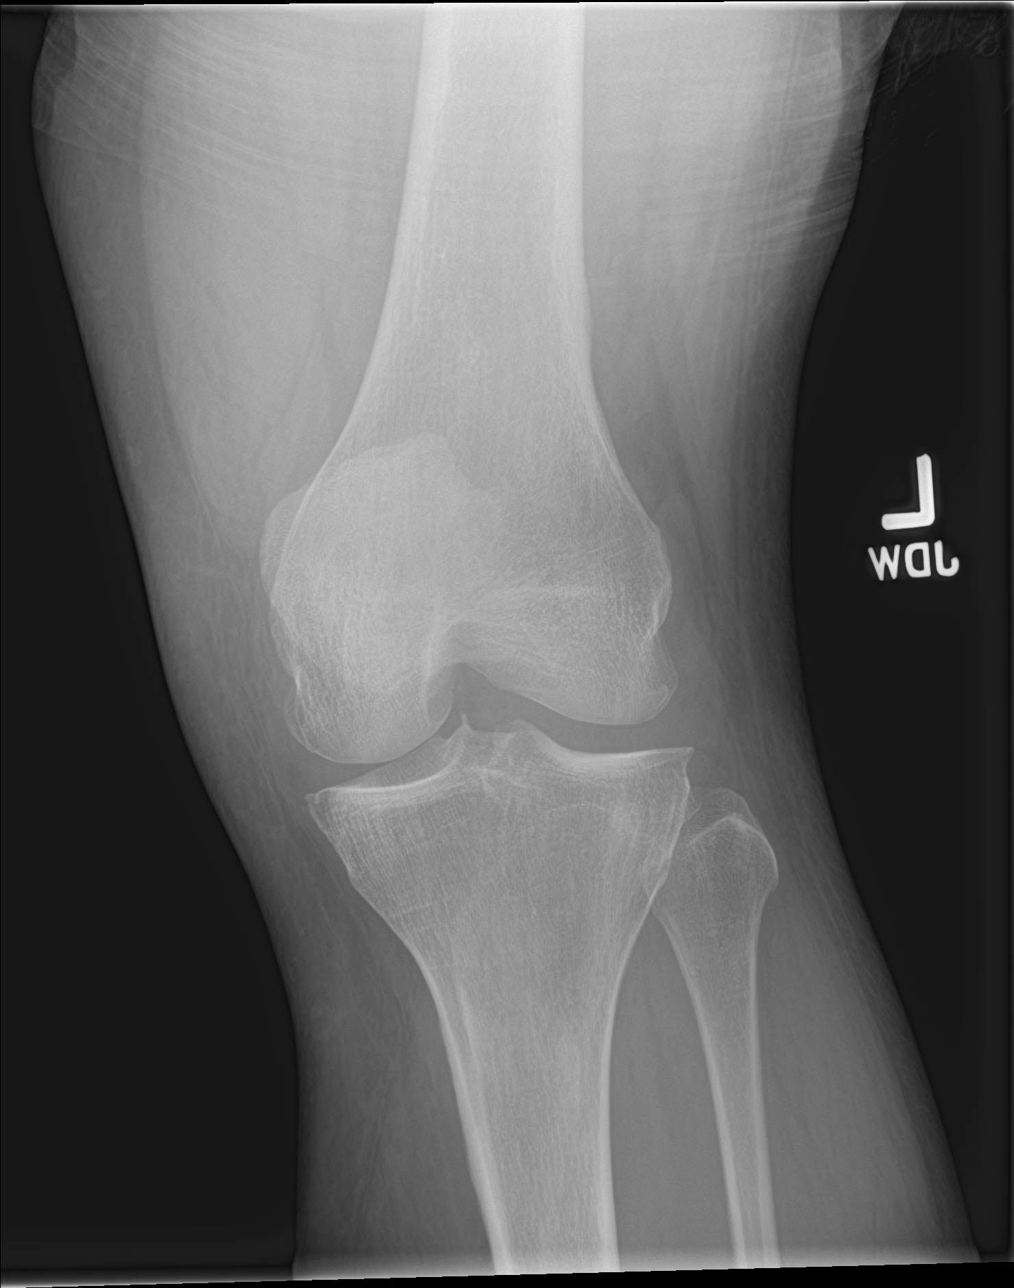

[knee obl (2 of 2)]
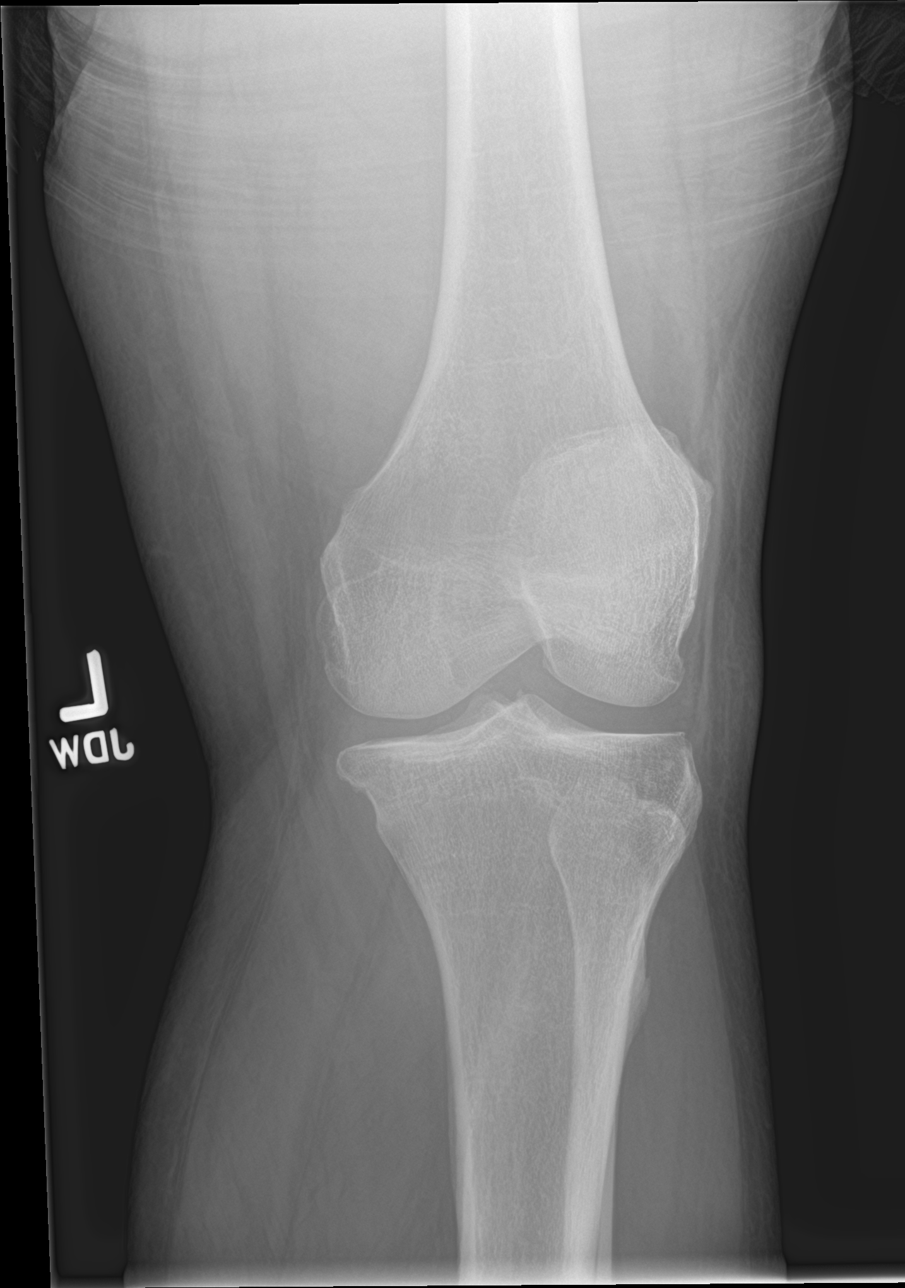

[4 of 4 positions shown; findings below may reference images not displayed]

FINDINGS: Mild osteophytic changes are noted in the medial and lateral joint
spaces. No acute fracture or dislocation is noted. No joint effusion
is seen.
IMPRESSION: Mild degenerative changes without acute abnormality.

## 2016-04-15 MED ORDER — FERROUS SULFATE 325 (65 FE) MG PO TABS: 325.0000 mg | ORAL_TABLET | Freq: Every day | ORAL | 0 refills | Status: DC

## 2016-04-15 MED ORDER — IBUPROFEN 200 MG PO TABS
600.0000 mg | ORAL_TABLET | Freq: Once | ORAL | Status: AC
  Administered 2016-04-15: 600 mg via ORAL
  Filled 2016-04-15: qty 1

## 2016-04-15 MED ORDER — AMOXICILLIN 500 MG PO TABS: 500.0000 mg | ORAL_TABLET | Freq: Two times a day (BID) | ORAL | 0 refills | Status: DC

## 2016-04-15 NOTE — Discharge Instructions (Signed)
Please make an appointment to follow up with your primary care provider.  Please take iron (ferrous sulfate) daily with breakfast. Please take your antibiotic for your ear infection twice a day for 5 days.

## 2016-04-15 NOTE — ED Triage Notes (Signed)
Per EMS pt was out trick-or-treating with her kids and began to feel dizzy.  Laid down.  Pt has been out of her BP meds for a few days, Lisinopril.  Also pt stated to EMS she normally takes Abilify but has not had that for a month approximately.

## 2016-04-15 NOTE — ED Provider Notes (Signed)
[facility]PT Provider Note   CSN: [id number] Arrival date & time: 04/15/16  2022     History   Chief Complaint Chief Complaint  Patient presents with  . Dizziness  . Hypertension    HPI Kaitlyn Gray is a 40 year old woman with history of bipolar disorder, hypertension, cocaine abuse.  HPI  She presents with lightheadedness. She felt tired and sleepy today. She was walking tonight. She felt lightheadedness and pre-syncopal. Her legs gave out. Her left knee hurts. She felt to her knees. She felt too weak to get back up.  She has right ear and right sided neck pain. She has a sore throat. She has been coughing for the past week.  She has been out of her BP medications for the last week.   She was in the ED on 10/17 for evaluation after syncopal event. She left against medical advice.   Last used cocaine 1 week ago.   Past Medical History:  Diagnosis Date  . Bipolar 1 disorder (HCC)   . Hypertension     Patient Active Problem List   Diagnosis Date Noted  . Cocaine use disorder, severe, dependence (HCC) 03/24/2015  . Cannabis use disorder, moderate, dependence (HCC) 03/24/2015  . MDD (major depressive disorder), recurrent severe, 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    Past Surgical History:  Procedure Laterality Date  . CESAREAN SECTION      OB History    No data available       Home Medications    Prior to Admission medications   Medication Sig Start Date End Date Taking? Authorizing Provider  albuterol (PROAIR HFA) 108 (90 Base) MCG/ACT inhaler Inhale 1-2 puffs into the lungs every 6 (six) hours as needed for wheezing or shortness of breath.   Yes Historical Provider, MD  ARIPiprazole (ABILIFY IM) Inject 400 mg into the muscle every 30 (thirty) days. [facility]   Yes Historical Provider, MD  lisinopril (PRINIVIL,ZESTRIL) 10 MG tablet Take 10 mg by mouth daily.   Yes Historical Provider, MD    oxyCODONE-acetaminophen (PERCOCET/ROXICET) 5-325 MG tablet Take 1 tablet by mouth every 8 (eight) hours as needed for severe pain. Patient taking differently: Take 1 tablet by mouth every 6 (six) hours as needed for severe pain.  03/31/16  Yes Elvina SidleKurt Lauenstein, MD  promethazine (PHENERGAN) 25 MG tablet Take 25 mg by mouth every 6 (six) hours as needed for nausea or vomiting.   Yes Historical Provider, MD  zolpidem (AMBIEN) 10 MG tablet Take 10 mg by mouth at bedtime as needed for sleep.   Yes Historical Provider, MD  amoxicillin (AMOXIL) 500 MG tablet Take 1 tablet (500 mg total) by mouth 2 (two) times daily. 04/15/16   Lora PaulaJennifer T Areil Ottey, MD  citalopram (CELEXA) 10 MG tablet Take 1 tablet (10 mg total) by mouth daily. Patient not taking: Reported on 04/15/2016 03/24/15   Beau FannyJohn C Withrow, FNP  divalproex (DEPAKOTE) 500 MG DR tablet Take 1 tablet (500 mg total) by mouth 2 (two) times daily. Patient not taking: Reported on 04/15/2016 03/24/15   Beau FannyJohn C Withrow, FNP  ferrous sulfate 325 (65 FE) MG tablet Take 1 tablet (325 mg total) by mouth daily with breakfast. 04/15/16   Lora PaulaJennifer T Yvonnie Schinke, MD  hydrOXYzine (ATARAX/VISTARIL) 50 MG tablet Take 1 tablet (50 mg total) by mouth 3 (three) times daily as needed for anxiety. Patient not taking: Reported on 04/15/2016 03/24/15   Beau FannyJohn C Withrow, FNP  methocarbamol (ROBAXIN)  500 MG tablet Take 1 tablet (500 mg total) by mouth 2 (two) times daily. Patient not taking: Reported on 04/15/2016 02/12/16   Dahlia Client Muthersbaugh, PA-C  naproxen (NAPROSYN) 500 MG tablet Take 1 tablet (500 mg total) by mouth 2 (two) times daily with a meal. Patient not taking: Reported on 04/15/2016 02/12/16   Dahlia Client Muthersbaugh, PA-C  traZODone (DESYREL) 100 MG tablet Take 1 tablet (100 mg total) by mouth at bedtime as needed for sleep. Patient not taking: Reported on 04/15/2016 03/24/15   Beau Fanny, FNP    Family History No family history on file.  Social History Social History   Substance Use Topics  . Smoking status: Current Every Day Smoker  . Smokeless tobacco: Never Used  . Alcohol use No     Allergies   Hydrocodone and Latex   Review of Systems Review of Systems Constitutional: no fevers, +chills Eyes: no vision changes Ears, nose, mouth, throat, and face: +cough, productive of black sputum for last week Respiratory: +shortness of breath at night Cardiovascular: +chest pain at night Gastrointestinal: no nausea/vomiting, no abdominal pain, no constipation, no diarrhea Genitourinary: no dysuria, no hematuria Integument: no rash Hematologic/lymphatic: no bleeding/bruising, no edema Musculoskeletal: no new myalgias Neurological: no new paresthesias, no focal weakness   Physical Exam Updated Vital Signs BP 147/89 (BP Location: Right Arm)   Pulse 76   Temp 98.1 F (36.7 C) (Oral)   Resp 16   LMP 03/04/2016 (Exact Date)   SpO2 100%   Physical Exam General Apperance: NAD Head: Normocephalic, atraumatic Eyes: PERRL, EOMI, anicteric sclera Ears: Normal external ear canal, right ear with mildly erythematous TM with some cloudiness. Left TM could not be visualized due to cerumen.  Nose: Nares normal, septum midline, mucosa normal Throat: Lips, mucosa and tongue normal  Neck: Supple, trachea midline Back: No tenderness or bony abnormality  Lungs: Clear to auscultation bilaterally. No wheezes, rhonchi or rales. Breathing comfortably Chest Wall: Tender centimeter nodule mid upper chest without overlying erythema. No drainage.  Heart: Regular rate and rhythm, no murmur/rub/gallop Abdomen: Soft, nontender, nondistended, no rebound/guarding Extremities: Normal, atraumatic, warm and well perfused, no edema Pulses: 2+ throughout Skin: No rashes or lesions Neurologic: Alert and oriented x 3. CNII-XII intact. Normal strength and sensation   ED Treatments / Results  Labs (all labs ordered are listed, but only abnormal results are displayed) Labs  Reviewed  BASIC METABOLIC PANEL - Abnormal; Notable for the following:       Result Value   Glucose, Bld 104 (*)    Anion gap 4 (*)    All other components within normal limits  CBC - Abnormal; Notable for the following:    RBC 3.82 (*)    Hemoglobin 9.9 (*)    HCT 31.8 (*)    MCH 25.9 (*)    RDW 15.7 (*)    All other components within normal limits  URINALYSIS, ROUTINE W REFLEX MICROSCOPIC (NOT AT [facility]) - Abnormal; Notable for the following:    Hgb urine dipstick LARGE (*)    Protein, ur 30 (*)    All other components within normal limits  URINE MICROSCOPIC-ADD ON - Abnormal; Notable for the following:    Squamous Epithelial / LPF 0-5 (*)    Bacteria, UA RARE (*)    All other components within normal limits  CBG MONITORING, ED - Abnormal; Notable for the following:    Glucose-Capillary 107 (*)    All other components within normal limits  RAPID URINE DRUG  SCREEN, HOSP PERFORMED  I-STAT TROPOININ, ED    EKG  EKG Interpretation  Date/Time:  Tuesday April 15 2016 20:30:57 EDT Ventricular Rate:  74 PR Interval:    QRS Duration: 114 QT Interval:  416 QTC Calculation: 462 R Axis:   29 Text Interpretation:  Sinus rhythm Borderline intraventricular conduction delay Borderline T wave abnormalities No significant change since last tracing Confirmed by BEATON  MD, ROBERT ([id number]) on 04/15/2016 8:56:52 PM       Radiology Dg Chest 2 View  Result Date: 04/15/2016 CLINICAL DATA:  Recent fall with chest pain, initial encounter EXAM: CHEST  2 VIEW COMPARISON:  12/25/2012 FINDINGS: Cardiac shadow is mildly enlarged. The lungs are clear bilaterally. No acute bony abnormality is seen. IMPRESSION: No acute abnormality noted. Electronically Signed   By: Alcide Clever M.D.   On: 04/15/2016 21:50   Dg Knee Complete 4 Views Left  Result Date: 04/15/2016 CLINICAL DATA:  Recent fall with knee pain, initial encounter EXAM: LEFT KNEE - COMPLETE 4+ VIEW COMPARISON:  None. FINDINGS: Mild  osteophytic changes are noted in the medial and lateral joint spaces. No acute fracture or dislocation is noted. No joint effusion is seen. IMPRESSION: Mild degenerative changes without acute abnormality. Electronically Signed   By: Alcide Clever M.D.   On: 04/15/2016 21:48    Procedures   Medications Ordered in ED Medications  ibuprofen (ADVIL,MOTRIN) tablet 600 mg (not administered)     Initial Impression / Assessment and Plan / ED Course  I have reviewed the triage vital signs and the nursing notes.  Pertinent labs & imaging results that were available during my care of the patient were reviewed by me and considered in my medical decision making (see chart for details).  Clinical Course  40 year old woman with history of bipolar disorder, hypertension, cocaine abuse presents with lightheadedness, right ear and right sided neck pain. CBC with Hgb 9.9. Previously 10.9 on 10/17. No leukocytosis. BMP unremarkable. Troponin POC 0. UA with negative leukocytes, nitrite, and no WBC. No infection. Large hgb. She denies melena or hematochezia. She is currently on her period and she reports she has heavy menstruation. CXR with no acute abnormality and XR left knee unremarkable.   Final Clinical Impressions(s) / ED Diagnoses   Final diagnoses:  Near syncope  Fall, initial encounter  Iron deficiency anemia due to chronic blood loss  Acute otitis media, unspecified otitis media type  Will start her on ferrous sulfate for her acute on chronic blood loss anemia.  Will treat her otitis media with amoxicillin 500mg  BID for 5 day course.  Follow up with PCP.  New Prescriptions New Prescriptions   AMOXICILLIN (AMOXIL) 500 MG TABLET    Take 1 tablet (500 mg total) by mouth 2 (two) times daily.   FERROUS SULFATE 325 (65 FE) MG TABLET    Take 1 tablet (325 mg total) by mouth daily with breakfast.     Lora Paula, MD 04/15/16 2230    Nelva Nay, MD 05/14/16 [phone]

## 2016-04-15 NOTE — ED Notes (Signed)
Patient denies pain and is resting comfortably.  

## 2016-04-15 NOTE — ED Triage Notes (Signed)
Pt states she fell when she became dizzy. Does not recall hitting her head.  Complaints of right neck pain and left knee pain.

## 2016-06-20 ENCOUNTER — Ambulatory Visit (HOSPITAL_COMMUNITY)
Admission: EM | Admit: 2016-06-20 | Discharge: 2016-06-20 | Disposition: A | Discharge status: Home / Self Care | Payer: Medicaid Other | Attending: Family Medicine | Admitting: Family Medicine

## 2016-06-20 ENCOUNTER — Encounter (HOSPITAL_COMMUNITY): Payer: Self-pay | Admitting: Family Medicine

## 2016-06-20 DIAGNOSIS — R197 Diarrhea, unspecified: Secondary | ICD-10-CM | POA: Diagnosis not present

## 2016-06-20 DIAGNOSIS — R112 Nausea with vomiting, unspecified: Secondary | ICD-10-CM

## 2016-06-20 MED ORDER — ONDANSETRON HCL 4 MG PO TABS: 4.0000 mg | ORAL_TABLET | Freq: Four times a day (QID) | ORAL | 0 refills | Status: DC

## 2016-06-20 MED ORDER — ONDANSETRON 4 MG PO TBDP
ORAL_TABLET | ORAL | Status: AC
  Filled 2016-06-20: qty 2

## 2016-06-20 MED ORDER — ONDANSETRON 4 MG PO TBDP
8.0000 mg | ORAL_TABLET | Freq: Once | ORAL | Status: AC
  Administered 2016-06-20: 8 mg via ORAL

## 2016-06-20 MED ORDER — SODIUM CHLORIDE 0.9 % IV SOLN
Freq: Once | INTRAVENOUS | Status: AC
Start: 2016-06-20 — End: 2016-06-20
  Administered 2016-06-20: 13:00:00 via INTRAVENOUS

## 2016-06-20 NOTE — ED Triage Notes (Signed)
Pt here for N,V,D. sts that it started yesterday and her son was sick with the same.

## 2016-06-20 NOTE — ED Provider Notes (Signed)
[facility]    CSN: [id number] Arrival date & time: 06/20/16  1152     History   Chief Complaint Chief Complaint  Patient presents with  . Abdominal Pain  . Emesis    HPI Kaitlyn Gray is a 41 y.o. female.   The history is provided by the patient.  Abdominal Pain  Pain location:  Generalized Pain quality: cramping   Pain severity:  Mild Onset quality:  Sudden Duration:  2 days Progression:  Unchanged Chronicity:  New Context: sick contacts   Context comment:  Son with same Relieved by:  None tried Ineffective treatments:  None tried Associated symptoms: chills, nausea and vomiting   Associated symptoms: no fever and no sore throat   Emesis  Associated symptoms: abdominal pain and chills   Associated symptoms: no fever and no sore throat     Past Medical History:  Diagnosis Date  . Bipolar 1 disorder (HCC)   . Hypertension     Patient Active Problem List   Diagnosis Date Noted  . Cocaine use disorder, severe, dependence (HCC) 03/24/2015  . Cannabis use disorder, moderate, dependence (HCC) 03/24/2015  . MDD (major depressive disorder), recurrent severe, 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    Past Surgical History:  Procedure Laterality Date  . CESAREAN SECTION      OB History    No data available       Home Medications    Prior to Admission medications   Medication Sig Start Date End Date Taking? Authorizing Provider  albuterol (PROAIR HFA) 108 (90 Base) MCG/ACT inhaler Inhale 1-2 puffs into the lungs every 6 (six) hours as needed for wheezing or shortness of breath.    Historical Provider, MD  amoxicillin (AMOXIL) 500 MG tablet Take 1 tablet (500 mg total) by mouth 2 (two) times daily. 04/15/16   Lora Paula, MD  ARIPiprazole (ABILIFY IM) Inject 400 mg into the muscle every 30 (thirty) days. patient gets this injection at [facility] center.    Historical Provider, MD  citalopram (CELEXA) 10 MG tablet Take 1  tablet (10 mg total) by mouth daily. Patient not taking: Reported on 04/15/2016 03/24/15   Beau Fanny, FNP  divalproex (DEPAKOTE) 500 MG DR tablet Take 1 tablet (500 mg total) by mouth 2 (two) times daily. Patient not taking: Reported on 04/15/2016 03/24/15   Beau Fanny, FNP  ferrous sulfate 325 (65 FE) MG tablet Take 1 tablet (325 mg total) by mouth daily with breakfast. 04/15/16   Lora Paula, MD  hydrOXYzine (ATARAX/VISTARIL) 50 MG tablet Take 1 tablet (50 mg total) by mouth 3 (three) times daily as needed for anxiety. Patient not taking: Reported on 04/15/2016 03/24/15   Beau Fanny, FNP  lisinopril (PRINIVIL,ZESTRIL) 10 MG tablet Take 10 mg by mouth daily.    Historical Provider, MD  methocarbamol (ROBAXIN) 500 MG tablet Take 1 tablet (500 mg total) by mouth 2 (two) times daily. Patient not taking: Reported on 04/15/2016 02/12/16   Dahlia Client Muthersbaugh, PA-C  naproxen (NAPROSYN) 500 MG tablet Take 1 tablet (500 mg total) by mouth 2 (two) times daily with a meal. Patient not taking: Reported on 04/15/2016 02/12/16   Dahlia Client Muthersbaugh, PA-C  oxyCODONE-acetaminophen (PERCOCET/ROXICET) 5-325 MG tablet Take 1 tablet by mouth every 8 (eight) hours as needed for severe pain. Patient taking differently: Take 1 tablet by mouth every 6 (six) hours as needed for severe pain.  03/31/16   Elvina Sidle, MD  promethazine (PHENERGAN) 25  MG tablet Take 25 mg by mouth every 6 (six) hours as needed for nausea or vomiting.    Historical Provider, MD  traZODone (DESYREL) 100 MG tablet Take 1 tablet (100 mg total) by mouth at bedtime as needed for sleep. Patient not taking: Reported on 04/15/2016 03/24/15   Beau Fanny, FNP  zolpidem (AMBIEN) 10 MG tablet Take 10 mg by mouth at bedtime as needed for sleep.    Historical Provider, MD    Family History History reviewed. No pertinent family history.  Social History Social History  Substance Use Topics  . Smoking status: Current Every Day  Smoker  . Smokeless tobacco: Never Used  . Alcohol use No     Allergies   Hydrocodone and Latex   Review of Systems Review of Systems  Constitutional: Positive for chills. Negative for fever.  HENT: Negative for sore throat.   Respiratory: Negative.   Cardiovascular: Negative.   Gastrointestinal: Positive for abdominal pain, nausea and vomiting. Negative for blood in stool.  Genitourinary: Negative.   All other systems reviewed and are negative.    Physical Exam Triage Vital Signs ED Triage Vitals [06/20/16 1209]  Enc Vitals Group     BP 124/76     Pulse Rate 83     Resp 18     Temp 98.1 F (36.7 C)     Temp src      SpO2 98 %     Weight      Height      Head Circumference      Peak Flow      Pain Score 10     Pain Loc      Pain Edu?      Excl. in GC?    No data found.   Updated Vital Signs BP 124/76   Pulse 83   Temp 98.1 F (36.7 C)   Resp 18   LMP 05/28/2016   SpO2 98%   Visual Acuity Right Eye Distance:   Left Eye Distance:   Bilateral Distance:    Right Eye Near:   Left Eye Near:    Bilateral Near:     Physical Exam  Constitutional: She is oriented to person, place, and time. She appears well-developed and well-nourished. No distress.  HENT:  Mouth/Throat: Oropharynx is clear and moist.  Neck: Normal range of motion. Neck supple.  Cardiovascular: Normal rate, regular rhythm, normal heart sounds and intact distal pulses.   Pulmonary/Chest: Effort normal and breath sounds normal.  Abdominal: Soft. Bowel sounds are normal. She exhibits no distension and no mass. There is tenderness. There is no guarding.  Lymphadenopathy:    She has no cervical adenopathy.  Neurological: She is alert and oriented to person, place, and time.  Skin: Skin is warm and dry. Rash noted. There is erythema.  Hydradenitis right axilla flare.  Nursing note and vitals reviewed.    UC Treatments / Results  Labs (all labs ordered are listed, but only abnormal  results are displayed) Labs Reviewed - No data to display  EKG  EKG Interpretation None       Radiology No results found.  Procedures Procedures (including critical care time)  Medications Ordered in UC Medications - No data to display   Initial Impression / Assessment and Plan / UC Course  I have reviewed the triage vital signs and the nursing notes.  Pertinent labs & imaging results that were available during my care of the patient were reviewed by me and  considered in my medical decision making (see chart for details).  Clinical Course     Sx improved after iv at d/c.  Final Clinical Impressions(s) / UC Diagnoses   Final diagnoses:  None    New Prescriptions New Prescriptions   No medications on file     Linna Hoff, MD 07/04/16 1432

## 2016-06-20 NOTE — ED Notes (Signed)
Iv  Ns  20  Angio  l  Arm  1  Att  Site patent

## 2016-06-20 NOTE — Discharge Instructions (Signed)
Clear liquid , bland diet tonight as tolerated, advance on sat as improved, use medicine as needed, return or see your doctor if any problems. Imodium for diarrhea as needed

## 2016-06-26 ENCOUNTER — Encounter (HOSPITAL_COMMUNITY): Payer: Self-pay

## 2016-06-26 ENCOUNTER — Emergency Department (HOSPITAL_COMMUNITY)
Admission: EM | Admit: 2016-06-26 | Discharge: 2016-06-26 | Disposition: A | Discharge status: Home / Self Care | Payer: Medicaid Other | Attending: Emergency Medicine | Admitting: Emergency Medicine

## 2016-06-26 DIAGNOSIS — I10 Essential (primary) hypertension: Secondary | ICD-10-CM | POA: Diagnosis not present

## 2016-06-26 DIAGNOSIS — M79606 Pain in leg, unspecified: Secondary | ICD-10-CM | POA: Diagnosis not present

## 2016-06-26 DIAGNOSIS — Z91040 Latex allergy status: Secondary | ICD-10-CM | POA: Insufficient documentation

## 2016-06-26 DIAGNOSIS — Z79899 Other long term (current) drug therapy: Secondary | ICD-10-CM | POA: Diagnosis not present

## 2016-06-26 DIAGNOSIS — F17210 Nicotine dependence, cigarettes, uncomplicated: Secondary | ICD-10-CM | POA: Diagnosis not present

## 2016-06-26 DIAGNOSIS — M545 Low back pain: Secondary | ICD-10-CM | POA: Diagnosis present

## 2016-06-26 NOTE — ED Triage Notes (Addendum)
Per EMS, [facility], c/o bilateral low back and BLE pain and cramping starting today.  Pain score 10/10.  Sts she was "walking around" when pain began.  Pt reports that she has been seen previously for similar.  Pt was ambulatory at Ctr and slept during transport.  Pt began moaning upon arrival to ED.

## 2016-06-26 NOTE — ED Triage Notes (Signed)
Pt requested pain medicine prior to discharge. Pt reminded that she was sleeping, and difficult to arouse during her visit today. Pt was address by multiple caregivers. Pt currently sitting in room eating chips

## 2016-06-26 NOTE — ED Provider Notes (Signed)
[facility] Provider Note   CSN: [id number] Arrival date & time: 06/26/16  1308  By signing my name below, I, Vista Mink, attest that this documentation has been prepared under the direction and in the presence of Langston Masker PA-C.  Electronically Signed: Vista Mink, ED Scribe. 06/26/16. 2:50 PM.  History   Chief Complaint Chief Complaint  Patient presents with  . Back Pain  . Leg Pain   HPI HPI Comments: Kaitlyn Gray is a 41 y.o. female, with Hx of BP1 disorder, who presents to the Emergency Department complaining of intermittent bilateral lower back pain that radiates to her bilateral lower extremities, onset today. Pt states that these episodes of pain have become more constant today. Pt reports "it feels like someone is hitting my legs with a hammer". Pt also states that she is experiencing this sensation in both of her hands. She rates her pain 10/10 currently. Pt is able to ambulate without significant difficulty. No dysuria.  The history is provided by the patient. No language interpreter was used.   Past Medical History:  Diagnosis Date  . Bipolar 1 disorder (HCC)   . Hypertension    Patient Active Problem List   Diagnosis Date Noted  . Cocaine use disorder, severe, dependence (HCC) 03/24/2015  . Cannabis use disorder, moderate, dependence (HCC) 03/24/2015  . MDD (major depressive disorder), recurrent severe, 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   Past Surgical History:  Procedure Laterality Date  . CESAREAN SECTION     OB History    No data available     Home Medications    Prior to Admission medications   Medication Sig Start Date End Date Taking? Authorizing Provider  albuterol (PROAIR HFA) 108 (90 Base) MCG/ACT inhaler Inhale 1-2 puffs into the lungs every 6 (six) hours as needed for wheezing or shortness of breath.    Historical Provider, MD  amoxicillin (AMOXIL) 500 MG tablet Take 1 tablet (500 mg total) by mouth 2 (two) times daily. 04/15/16    Lora Paula, MD  ARIPiprazole (ABILIFY IM) Inject 400 mg into the muscle every 30 (thirty) days. patient gets this injection at [facility] center.    Historical Provider, MD  citalopram (CELEXA) 10 MG tablet Take 1 tablet (10 mg total) by mouth daily. Patient not taking: Reported on 04/15/2016 03/24/15   Beau Fanny, FNP  divalproex (DEPAKOTE) 500 MG DR tablet Take 1 tablet (500 mg total) by mouth 2 (two) times daily. Patient not taking: Reported on 04/15/2016 03/24/15   Beau Fanny, FNP  ferrous sulfate 325 (65 FE) MG tablet Take 1 tablet (325 mg total) by mouth daily with breakfast. 04/15/16   Lora Paula, MD  hydrOXYzine (ATARAX/VISTARIL) 50 MG tablet Take 1 tablet (50 mg total) by mouth 3 (three) times daily as needed for anxiety. Patient not taking: Reported on 04/15/2016 03/24/15   Beau Fanny, FNP  lisinopril (PRINIVIL,ZESTRIL) 10 MG tablet Take 10 mg by mouth daily.    Historical Provider, MD  methocarbamol (ROBAXIN) 500 MG tablet Take 1 tablet (500 mg total) by mouth 2 (two) times daily. Patient not taking: Reported on 04/15/2016 02/12/16   Dahlia Client Muthersbaugh, PA-C  naproxen (NAPROSYN) 500 MG tablet Take 1 tablet (500 mg total) by mouth 2 (two) times daily with a meal. Patient not taking: Reported on 04/15/2016 02/12/16   Dahlia Client Muthersbaugh, PA-C  ondansetron (ZOFRAN) 4 MG tablet Take 1 tablet (4 mg total) by mouth every 6 (six) hours. Prn n/v. 06/20/16  Linna Hoff, MD  oxyCODONE-acetaminophen (PERCOCET/ROXICET) 5-325 MG tablet Take 1 tablet by mouth every 8 (eight) hours as needed for severe pain. Patient taking differently: Take 1 tablet by mouth every 6 (six) hours as needed for severe pain.  03/31/16   Elvina Sidle, MD  promethazine (PHENERGAN) 25 MG tablet Take 25 mg by mouth every 6 (six) hours as needed for nausea or vomiting.    Historical Provider, MD  traZODone (DESYREL) 100 MG tablet Take 1 tablet (100 mg total) by mouth at bedtime as  needed for sleep. Patient not taking: Reported on 04/15/2016 03/24/15   Beau Fanny, FNP  zolpidem (AMBIEN) 10 MG tablet Take 10 mg by mouth at bedtime as needed for sleep.    Historical Provider, MD   Family History History reviewed. No pertinent family history.  Social History Social History  Substance Use Topics  . Smoking status: Current Every Day Smoker    Types: Cigarettes  . Smokeless tobacco: Never Used  . Alcohol use No    Allergies   Hydrocodone and Latex   Review of Systems Review of Systems  Genitourinary: Negative for dysuria.  Musculoskeletal: Positive for back pain (lower back) and myalgias (Bilateral LE, Bilateral hands).    Physical Exam Updated Vital Signs BP (!) 149/101 (BP Location: Right Arm)   Pulse 93   Temp 98.5 F (36.9 C) (Oral)   Resp 20   LMP 06/26/2016   SpO2 100%   Physical Exam  Constitutional: She is oriented to person, place, and time. She appears well-developed and well-nourished. No distress.  HENT:  Head: Normocephalic and atraumatic.  Neck: Normal range of motion.  Pulmonary/Chest: Effort normal.  Neurological: She is alert and oriented to person, place, and time.  Somulent. Pt drifts asleep and then awakens. Pt says that she is tired.  Skin: Skin is warm and dry. She is not diaphoretic.  Psychiatric: She has a normal mood and affect. Judgment normal.  Nursing note and vitals reviewed.   ED Treatments / Results  DIAGNOSTIC STUDIES: Oxygen Saturation is 100% on RA, normal by my interpretation.  COORDINATION OF CARE: 2:48 PM-Discussed treatment plan with pt at bedside and pt agreed to plan.   Labs (all labs ordered are listed, but only abnormal results are displayed) Labs Reviewed - No data to display  EKG  EKG Interpretation None       Radiology No results found.  Procedures Procedures (including critical care time)  Medications Ordered in ED Medications - No data to display   Initial Impression /  Assessment and Plan / ED Course  I have reviewed the triage vital signs and the nursing notes.  Pertinent labs & imaging results that were available during my care of the patient were reviewed by me and considered in my medical decision making (see chart for details).  Clinical Course     Pt decided to leave.  She decided she did not need ay further evaluation  Final Clinical Impressions(s) / ED Diagnoses   Final diagnoses:  Pain of lower extremity, unspecified laterality    New Prescriptions Discharge Medication List as of 06/26/2016  3:06 PM     I personally performed the services in this documentation, which was scribed in my presence.  The recorded information has been reviewed and considered.   Barnet Pall.    Lonia Skinner [place], PA-C 06/26/16 1851    Loren Racer, MD 07/05/16 1136

## 2016-06-26 NOTE — Discharge Instructions (Signed)
Return if any problems.

## 2016-07-14 ENCOUNTER — Encounter (HOSPITAL_COMMUNITY): Payer: Self-pay | Admitting: *Deleted

## 2016-07-14 ENCOUNTER — Emergency Department (HOSPITAL_COMMUNITY)
Admission: EM | Admit: 2016-07-14 | Discharge: 2016-07-14 | Disposition: A | Discharge status: Home / Self Care | Payer: Medicaid Other | Attending: Emergency Medicine | Admitting: Emergency Medicine

## 2016-07-14 DIAGNOSIS — M545 Low back pain: Secondary | ICD-10-CM | POA: Insufficient documentation

## 2016-07-14 DIAGNOSIS — Z91040 Latex allergy status: Secondary | ICD-10-CM | POA: Insufficient documentation

## 2016-07-14 DIAGNOSIS — G8929 Other chronic pain: Secondary | ICD-10-CM | POA: Diagnosis present

## 2016-07-14 DIAGNOSIS — F17210 Nicotine dependence, cigarettes, uncomplicated: Secondary | ICD-10-CM | POA: Diagnosis not present

## 2016-07-14 DIAGNOSIS — I10 Essential (primary) hypertension: Secondary | ICD-10-CM | POA: Insufficient documentation

## 2016-07-14 DIAGNOSIS — Z79899 Other long term (current) drug therapy: Secondary | ICD-10-CM | POA: Insufficient documentation

## 2016-07-14 MED ORDER — IBUPROFEN 800 MG PO TABS
800.0000 mg | ORAL_TABLET | Freq: Once | ORAL | Status: DC
  Filled 2016-07-14: qty 1

## 2016-07-14 MED ORDER — NAPROXEN 500 MG PO TABS: 500.0000 mg | ORAL_TABLET | Freq: Two times a day (BID) | ORAL | 0 refills | Status: DC

## 2016-07-14 MED ORDER — OXYCODONE-ACETAMINOPHEN 5-325 MG PO TABS: 2.0000 | ORAL_TABLET | ORAL | 0 refills | Status: DC | PRN
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
## 2016-07-14 NOTE — Discharge Instructions (Signed)
Your symptoms are probably due to a flare of your chronic back pain. As we discussed chronic back pain is best managed by a primary care provider or by a pain specialist, not in the emergency department. You have been given a prescription for 6 tabs of Percocet for your severe, acute back pain. You also have a prescription for naproxen which is an anti-inflammatory and anti-pain medicine. You can take 500 mg naproxen + 650 mg of Tylenol for pain three times a day. Mixing naproxen and Tylenol provides the same amount of pain relief as one Percocet with much less adverse effects. Please call your primary care doctor to make an appointment to discuss your chronic back pain.
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
## 2017-01-07 ENCOUNTER — Emergency Department (HOSPITAL_COMMUNITY)
Admission: EM | Admit: 2017-01-07 | Discharge: 2017-01-07 | Disposition: A | Discharge status: Home / Self Care | Payer: Medicaid Other | Attending: Emergency Medicine | Admitting: Emergency Medicine

## 2017-01-07 ENCOUNTER — Encounter (HOSPITAL_COMMUNITY): Payer: Self-pay | Admitting: *Deleted

## 2017-01-07 ENCOUNTER — Emergency Department (HOSPITAL_COMMUNITY): Payer: Medicaid Other

## 2017-01-07 DIAGNOSIS — Z79899 Other long term (current) drug therapy: Secondary | ICD-10-CM | POA: Insufficient documentation

## 2017-01-07 DIAGNOSIS — I10 Essential (primary) hypertension: Secondary | ICD-10-CM | POA: Insufficient documentation

## 2017-01-07 DIAGNOSIS — Z91040 Latex allergy status: Secondary | ICD-10-CM | POA: Diagnosis not present

## 2017-01-07 DIAGNOSIS — Y9389 Activity, other specified: Secondary | ICD-10-CM | POA: Diagnosis not present

## 2017-01-07 DIAGNOSIS — Y998 Other external cause status: Secondary | ICD-10-CM | POA: Insufficient documentation

## 2017-01-07 DIAGNOSIS — F17210 Nicotine dependence, cigarettes, uncomplicated: Secondary | ICD-10-CM | POA: Insufficient documentation

## 2017-01-07 DIAGNOSIS — S3992XA Unspecified injury of lower back, initial encounter: Secondary | ICD-10-CM | POA: Diagnosis present

## 2017-01-07 DIAGNOSIS — M25561 Pain in right knee: Secondary | ICD-10-CM | POA: Diagnosis not present

## 2017-01-07 DIAGNOSIS — M25562 Pain in left knee: Secondary | ICD-10-CM | POA: Insufficient documentation

## 2017-01-07 DIAGNOSIS — Y92410 Unspecified street and highway as the place of occurrence of the external cause: Secondary | ICD-10-CM | POA: Diagnosis not present

## 2017-01-07 DIAGNOSIS — R0789 Other chest pain: Secondary | ICD-10-CM | POA: Diagnosis not present

## 2017-01-07 DIAGNOSIS — S39012A Strain of muscle, fascia and tendon of lower back, initial encounter: Secondary | ICD-10-CM

## 2017-01-07 LAB — I-STAT CHEM 8, ED
BUN: 8 mg/dL (ref 6–20)
Calcium, Ion: 1.26 mmol/L (ref 1.15–1.40)
Chloride: 105 mmol/L (ref 101–111)
Creatinine, Ser: 0.9 mg/dL (ref 0.44–1.00)
Glucose, Bld: 78 mg/dL (ref 65–99)
HEMATOCRIT: 31 % — AB (ref 36.0–46.0)
Hemoglobin: 10.5 g/dL — ABNORMAL LOW (ref 12.0–15.0)
Potassium: 3.9 mmol/L (ref 3.5–5.1)
SODIUM: 141 mmol/L (ref 135–145)
TCO2: 28 mmol/L (ref 0–100)

## 2017-01-07 LAB — I-STAT BETA HCG BLOOD, ED (MC, WL, AP ONLY)
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
## 2017-01-07 MED ORDER — KETOROLAC TROMETHAMINE 30 MG/ML IJ SOLN
30.0000 mg | Freq: Once | INTRAMUSCULAR | Status: AC
  Administered 2017-01-07: 30 mg via INTRAMUSCULAR
  Filled 2017-01-07: qty 1

## 2017-01-07 MED ORDER — IBUPROFEN 600 MG PO TABS: 600.0000 mg | ORAL_TABLET | Freq: Four times a day (QID) | ORAL | 0 refills | Status: DC | PRN
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
## 2017-01-07 NOTE — ED Notes (Signed)
MD at bedside evaluating pt.
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
## 2017-01-07 NOTE — Discharge Instructions (Signed)
Use heat packs, and take tylenol and ibuprofen for pain. Please see your PCP for ongoing pain management. Return without fail for worsening symptoms, including confusion, intractable vomiting, inability to walk or any other symptoms concerning ot you.
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
## 2017-01-07 NOTE — ED Notes (Signed)
Pt states, "I have a weak bladder and I have to go to the bathroom now." pt encouraged to remain in the bed until this RN could assist, pt agitated, pt yelling in hallway, pts visitor reports the pt had to go to the bathroom and could not wait so he helped her go, pt back in stretcher upon assessement

## 2017-03-04 ENCOUNTER — Emergency Department (HOSPITAL_COMMUNITY): Admission: EM | Admit: 2017-03-04 | Discharge: 2017-03-04 | Discharge status: Absent without leave | Payer: Self-pay

## 2017-03-04 NOTE — ED Notes (Signed)
Pt does not answer when called 
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
## 2017-07-19 ENCOUNTER — Other Ambulatory Visit: Payer: Self-pay

## 2017-07-19 ENCOUNTER — Encounter (HOSPITAL_COMMUNITY): Payer: Self-pay | Admitting: Emergency Medicine

## 2017-07-19 ENCOUNTER — Inpatient Hospital Stay (HOSPITAL_COMMUNITY): Payer: Medicaid Other

## 2017-07-19 ENCOUNTER — Emergency Department (HOSPITAL_COMMUNITY): Payer: Medicaid Other

## 2017-07-19 ENCOUNTER — Inpatient Hospital Stay (HOSPITAL_COMMUNITY)
Admission: EM | Admit: 2017-07-19 | Discharge: 2017-07-22 | DRG: 638 | Disposition: A | Discharge status: Home / Self Care | Payer: Medicaid Other | Attending: Internal Medicine | Admitting: Internal Medicine

## 2017-07-19 DIAGNOSIS — E119 Type 2 diabetes mellitus without complications: Secondary | ICD-10-CM | POA: Diagnosis present

## 2017-07-19 DIAGNOSIS — F1210 Cannabis abuse, uncomplicated: Secondary | ICD-10-CM | POA: Diagnosis present

## 2017-07-19 DIAGNOSIS — F319 Bipolar disorder, unspecified: Secondary | ICD-10-CM | POA: Diagnosis present

## 2017-07-19 DIAGNOSIS — F1420 Cocaine dependence, uncomplicated: Secondary | ICD-10-CM | POA: Diagnosis present

## 2017-07-19 DIAGNOSIS — L02415 Cutaneous abscess of right lower limb: Secondary | ICD-10-CM | POA: Diagnosis present

## 2017-07-19 DIAGNOSIS — D638 Anemia in other chronic diseases classified elsewhere: Secondary | ICD-10-CM | POA: Diagnosis present

## 2017-07-19 DIAGNOSIS — I119 Hypertensive heart disease without heart failure: Secondary | ICD-10-CM | POA: Diagnosis present

## 2017-07-19 DIAGNOSIS — F17210 Nicotine dependence, cigarettes, uncomplicated: Secondary | ICD-10-CM | POA: Diagnosis present

## 2017-07-19 DIAGNOSIS — Z590 Homelessness: Secondary | ICD-10-CM

## 2017-07-19 DIAGNOSIS — R079 Chest pain, unspecified: Secondary | ICD-10-CM | POA: Diagnosis not present

## 2017-07-19 DIAGNOSIS — F250 Schizoaffective disorder, bipolar type: Secondary | ICD-10-CM | POA: Diagnosis present

## 2017-07-19 DIAGNOSIS — Z791 Long term (current) use of non-steroidal anti-inflammatories (NSAID): Secondary | ICD-10-CM

## 2017-07-19 DIAGNOSIS — I351 Nonrheumatic aortic (valve) insufficiency: Secondary | ICD-10-CM | POA: Diagnosis not present

## 2017-07-19 DIAGNOSIS — F1910 Other psychoactive substance abuse, uncomplicated: Secondary | ICD-10-CM | POA: Diagnosis present

## 2017-07-19 DIAGNOSIS — Z79899 Other long term (current) drug therapy: Secondary | ICD-10-CM | POA: Diagnosis not present

## 2017-07-19 DIAGNOSIS — I10 Essential (primary) hypertension: Secondary | ICD-10-CM | POA: Diagnosis not present

## 2017-07-19 DIAGNOSIS — R739 Hyperglycemia, unspecified: Secondary | ICD-10-CM

## 2017-07-19 DIAGNOSIS — E1100 Type 2 diabetes mellitus with hyperosmolarity without nonketotic hyperglycemic-hyperosmolar coma (NKHHC): Principal | ICD-10-CM | POA: Diagnosis present

## 2017-07-19 DIAGNOSIS — E1101 Type 2 diabetes mellitus with hyperosmolarity with coma: Secondary | ICD-10-CM | POA: Diagnosis not present

## 2017-07-19 HISTORY — DX: Bipolar disorder, unspecified: F31.9

## 2017-07-19 HISTORY — DX: Type 2 diabetes mellitus with hyperosmolarity without nonketotic hyperglycemic-hyperosmolar coma (NKHHC): E11.00

## 2017-07-19 HISTORY — DX: Other psychoactive substance abuse, uncomplicated: F19.10

## 2017-07-19 LAB — TSH: TSH: 0.366 u[IU]/mL (ref 0.350–4.500)

## 2017-07-19 LAB — CBG MONITORING, ED
GLUCOSE-CAPILLARY: 571 mg/dL — AB (ref 65–99)
Glucose-Capillary: 371 mg/dL — ABNORMAL HIGH (ref 65–99)
Glucose-Capillary: 415 mg/dL — ABNORMAL HIGH (ref 65–99)
Glucose-Capillary: 321 mg/dL — ABNORMAL HIGH (ref 65–99)

## 2017-07-19 LAB — HEPATIC FUNCTION PANEL
ALBUMIN: 1.1 g/dL — AB (ref 3.5–5.0)
ALT: 6 U/L — ABNORMAL LOW (ref 14–54)
AST: 6 U/L — ABNORMAL LOW (ref 15–41)
Alkaline Phosphatase: 27 U/L — ABNORMAL LOW (ref 38–126)
BILIRUBIN TOTAL: 0.3 mg/dL (ref 0.3–1.2)
Bilirubin, Direct: <0.1 mg/dL — ABNORMAL LOW (ref 0.1–0.5)

## 2017-07-19 LAB — BASIC METABOLIC PANEL
ANION GAP: 9 (ref 5–15)
Anion gap: 10 (ref 5–15)
BUN: 10 mg/dL (ref 6–20)
BUN: 7 mg/dL (ref 6–20)
CALCIUM: 8.5 mg/dL — AB (ref 8.9–10.3)
CHLORIDE: 90 mmol/L — AB (ref 101–111)
CO2: 24 mmol/L (ref 22–32)
CO2: 23 mmol/L (ref 22–32)
Calcium: 8.8 mg/dL — ABNORMAL LOW (ref 8.9–10.3)
Chloride: 103 mmol/L (ref 101–111)
Creatinine, Ser: 1.04 mg/dL — ABNORMAL HIGH (ref 0.44–1.00)
Creatinine, Ser: 0.74 mg/dL (ref 0.44–1.00)
GFR calc Af Amer: >60 mL/min (ref >60)
GFR calc Af Amer: >60 mL/min (ref >60)
GLUCOSE: 766 mg/dL — AB (ref 65–99)
GLUCOSE: 162 mg/dL — AB (ref 65–99)
POTASSIUM: 3.2 mmol/L — AB (ref 3.5–5.1)
Potassium: 4.2 mmol/L (ref 3.5–5.1)
SODIUM: 123 mmol/L — AB (ref 135–145)
SODIUM: 136 mmol/L (ref 135–145)

## 2017-07-19 LAB — ECHOCARDIOGRAM COMPLETE
AVLVOTPG: 9 mmHg
E/e' ratio: 7.8
EWDT: 232 ms
FS: 38 % (ref 28–44)
Height: 64 in
IV/PV OW: 1
LA diam index: 1.84 cm/m2
LASIZE: 38 mm
LAVOL: 56.9 mL
LAVOLA4C: 49.5 mL
LAVOLIN: 27.5 mL/m2
LDCA: 3.8 cm2
LEFT ATRIUM END SYS DIAM: 38 mm
LV E/e' medial: 7.8
LV E/e'average: 7.8
LV TDI E'MEDIAL: 10.3
LV e' LATERAL: 12 cm/s
LVOT SV: 105 mL
LVOT VTI: 27.6 cm
LVOT peak vel: 150 cm/s
LVOTD: 22 mm
Lateral S' vel: 17.8 cm/s
MV Dec: 232
MV Peak grad: 4 mmHg
MVPKAVEL: 91.6 m/s
MVPKEVEL: 93.6 m/s
PW: 11 mm — AB (ref 0.6–1.1)
TDI e' lateral: 12
Weight: 3661.4 oz

## 2017-07-19 LAB — CBC
HCT: 36.2 % (ref 36.0–46.0)
HEMOGLOBIN: 11.3 g/dL — AB (ref 12.0–15.0)
MCH: 26.2 pg (ref 26.0–34.0)
MCHC: 31.2 g/dL (ref 30.0–36.0)
MCV: 83.8 fL (ref 78.0–100.0)
Platelets: 307 10*3/uL (ref 150–400)
RBC: 4.32 MIL/uL (ref 3.87–5.11)
RDW: 15.5 % (ref 11.5–15.5)
WBC: 7.6 10*3/uL (ref 4.0–10.5)

## 2017-07-19 LAB — GLUCOSE, CAPILLARY
GLUCOSE-CAPILLARY: 211 mg/dL — AB (ref 65–99)
GLUCOSE-CAPILLARY: 352 mg/dL — AB (ref 65–99)
GLUCOSE-CAPILLARY: 330 mg/dL — AB (ref 65–99)
GLUCOSE-CAPILLARY: 282 mg/dL — AB (ref 65–99)
Glucose-Capillary: 360 mg/dL — ABNORMAL HIGH (ref 65–99)
Glucose-Capillary: 207 mg/dL — ABNORMAL HIGH (ref 65–99)
Glucose-Capillary: 153 mg/dL — ABNORMAL HIGH (ref 65–99)

## 2017-07-19 LAB — URINALYSIS, ROUTINE W REFLEX MICROSCOPIC
BILIRUBIN URINE: NEGATIVE
Bacteria, UA: NONE SEEN
HGB URINE DIPSTICK: NEGATIVE
Ketones, ur: NEGATIVE mg/dL
Leukocytes, UA: NEGATIVE
Nitrite: NEGATIVE
PH: 7 (ref 5.0–8.0)
Protein, ur: NEGATIVE mg/dL
SPECIFIC GRAVITY, URINE: 1.024 (ref 1.005–1.030)

## 2017-07-19 LAB — I-STAT BETA HCG BLOOD, ED (MC, WL, AP ONLY): I-stat hCG, quantitative: <5 m[IU]/mL (ref <5)

## 2017-07-19 LAB — I-STAT CHEM 8, ED
BUN: 9 mg/dL (ref 6–20)
CREATININE: 0.7 mg/dL (ref 0.44–1.00)
Calcium, Ion: 1.15 mmol/L (ref 1.15–1.40)
Chloride: 100 mmol/L — ABNORMAL LOW (ref 101–111)
Glucose, Bld: 383 mg/dL — ABNORMAL HIGH (ref 65–99)
HEMATOCRIT: 30 % — AB (ref 36.0–46.0)
Hemoglobin: 10.2 g/dL — ABNORMAL LOW (ref 12.0–15.0)
Potassium: 3.6 mmol/L (ref 3.5–5.1)
Sodium: 136 mmol/L (ref 135–145)
TCO2: 23 mmol/L (ref 22–32)

## 2017-07-19 LAB — I-STAT TROPONIN, ED: Troponin i, poc: 0.01 ng/mL (ref 0.00–0.08)

## 2017-07-19 LAB — TROPONIN I: Troponin I: <0.03 ng/mL (ref <0.03)

## 2017-07-19 LAB — ETHANOL

## 2017-07-19 LAB — VALPROIC ACID LEVEL: Valproic Acid Lvl: <10 ug/mL — ABNORMAL LOW (ref 50.0–100.0)

## 2017-07-19 LAB — HIV ANTIBODY (ROUTINE TESTING W REFLEX): HIV SCREEN 4TH GENERATION: NONREACTIVE

## 2017-07-19 LAB — RAPID URINE DRUG SCREEN, HOSP PERFORMED
Amphetamines: NOT DETECTED
Barbiturates: NOT DETECTED
Benzodiazepines: NOT DETECTED
Cocaine: POSITIVE — AB
Opiates: NOT DETECTED
Tetrahydrocannabinol: POSITIVE — AB

## 2017-07-19 LAB — MRSA PCR SCREENING: MRSA BY PCR: NEGATIVE

## 2017-07-19 LAB — HEMOGLOBIN A1C
Hgb A1c MFr Bld: 12.6 % — ABNORMAL HIGH (ref 4.8–5.6)
MEAN PLASMA GLUCOSE: 314.92 mg/dL

## 2017-07-19 IMAGING — DX DG CHEST 2V
2 series · 2 of 2 positions shown · non-contrast
Comparison: Chest radiograph [DATE]

CLINICAL DATA: Chest pain, now resolved.  History of diabetes.

EXAM:
CHEST  2 VIEW

[chest pa]
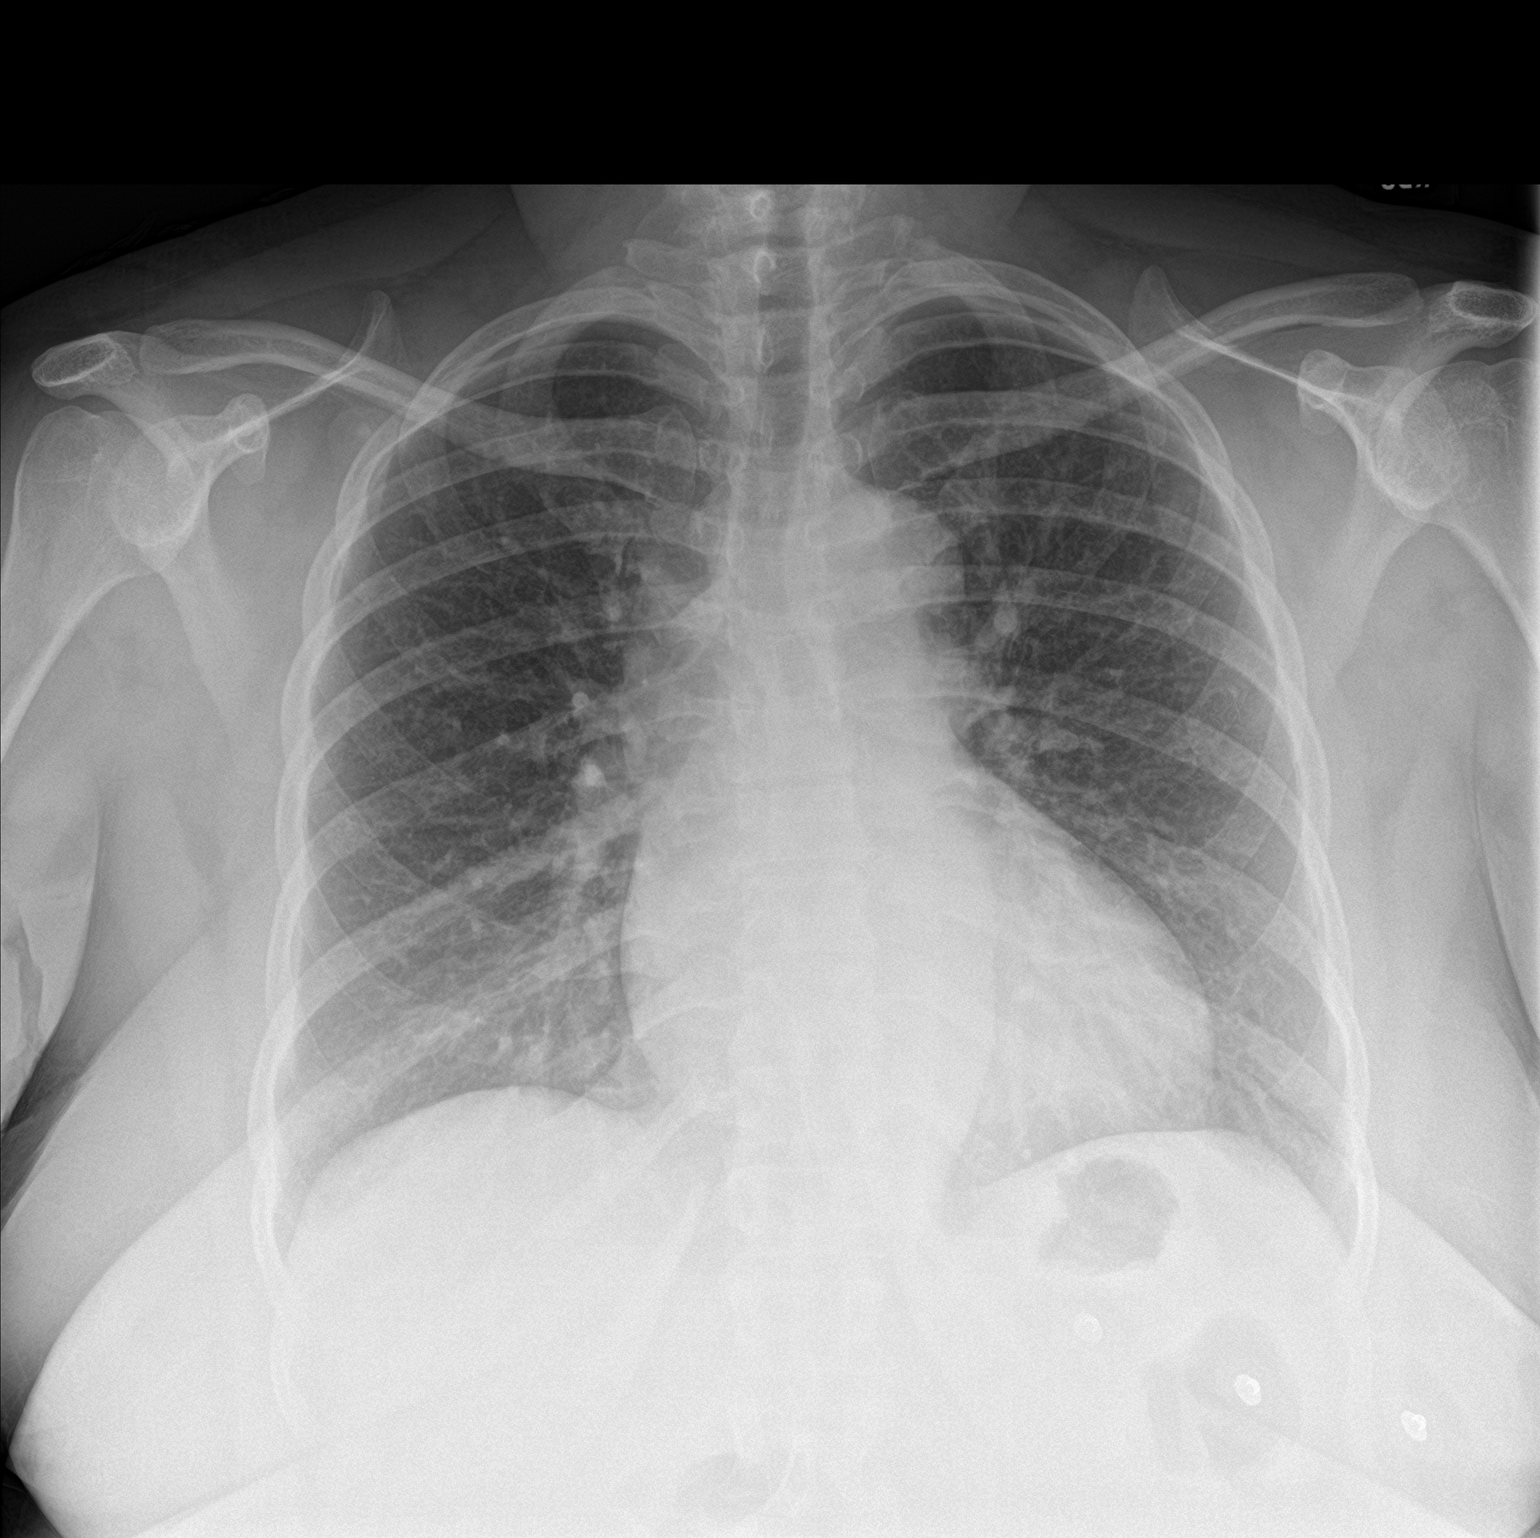

[chest lat]
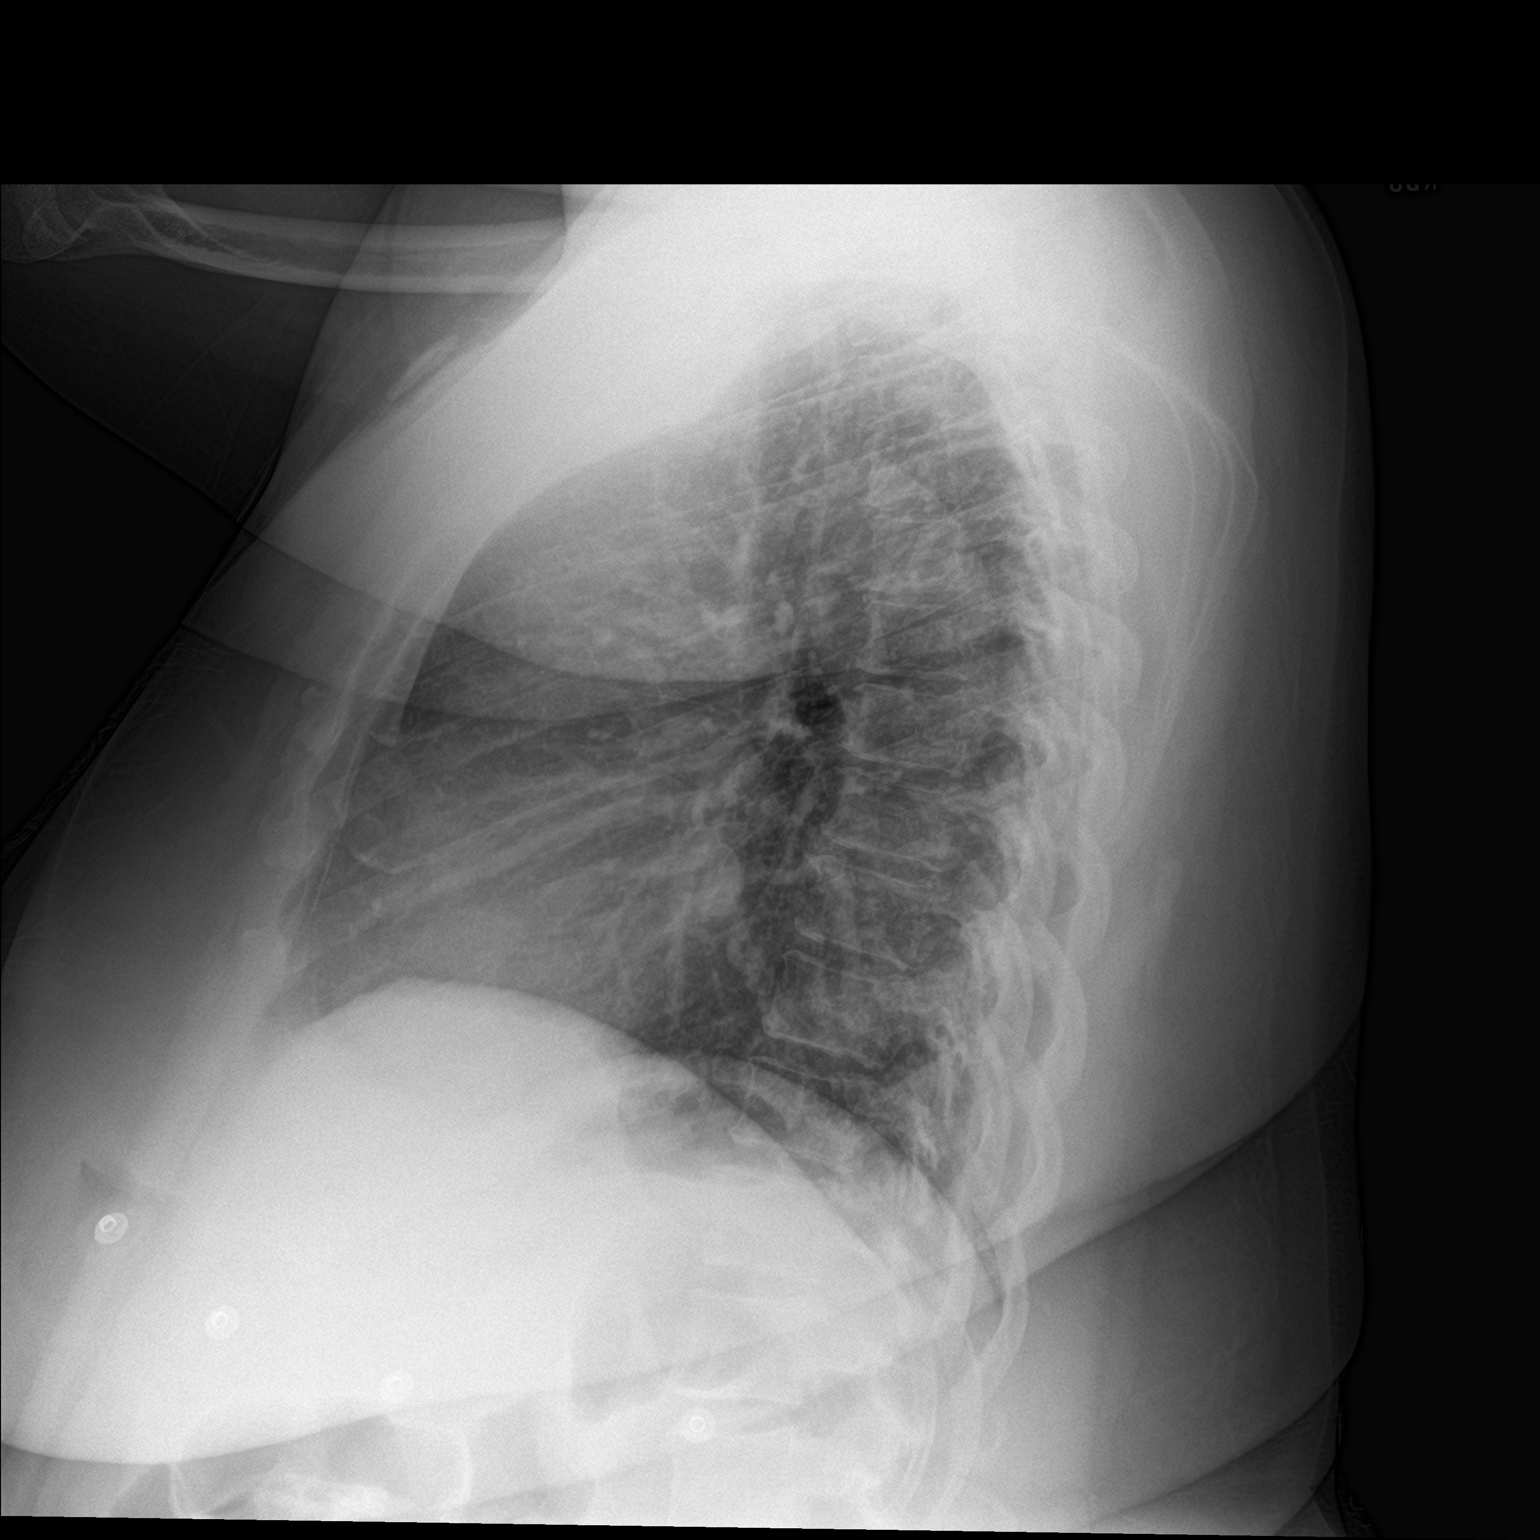

[2 of 2 positions shown; findings below may reference images not displayed]

FINDINGS: Cardiac silhouette is mildly enlarged and unchanged. Mediastinal
silhouette is normal. Mild bronchitic changes without pleural
effusion or focal consolidation. No pneumothorax. Soft tissue planes
and included osseous structures are non suspicious.
IMPRESSION: Mild cardiomegaly. Mild bronchitic changes without focal
consolidation.

## 2017-07-19 MED ORDER — LISINOPRIL 10 MG PO TABS
10.0000 mg | ORAL_TABLET | Freq: Every day | ORAL | Status: DC
  Administered 2017-07-19 – 2017-07-22 (×4): 10 mg via ORAL
  Filled 2017-07-19 (×4): qty 1

## 2017-07-19 MED ORDER — ONDANSETRON HCL 4 MG PO TABS: 4.0000 mg | ORAL_TABLET | Freq: Four times a day (QID) | ORAL | Status: DC | PRN

## 2017-07-19 MED ORDER — ACETAMINOPHEN 325 MG PO TABS: 650.0000 mg | ORAL_TABLET | Freq: Four times a day (QID) | ORAL | Status: DC | PRN

## 2017-07-19 MED ORDER — ENOXAPARIN SODIUM 40 MG/0.4ML ~~LOC~~ SOLN
40.0000 mg | Freq: Every day | SUBCUTANEOUS | Status: DC
  Administered 2017-07-21 – 2017-07-22 (×2): 40 mg via SUBCUTANEOUS
  Filled 2017-07-19 (×3): qty 0.4

## 2017-07-19 MED ORDER — SODIUM CHLORIDE 0.9 % IV SOLN
INTRAVENOUS | Status: DC
  Administered 2017-07-19: 5.1 [IU]/h via INTRAVENOUS
  Filled 2017-07-19: qty 1

## 2017-07-19 MED ORDER — SODIUM CHLORIDE 0.9 % IV SOLN
INTRAVENOUS | Status: DC
  Administered 2017-07-19 (×2): via INTRAVENOUS

## 2017-07-19 MED ORDER — DEXTROSE 50 % IV SOLN: 25.0000 mL | INTRAVENOUS | Status: DC | PRN

## 2017-07-19 MED ORDER — HYDRALAZINE HCL 20 MG/ML IJ SOLN: 10.0000 mg | INTRAMUSCULAR | Status: DC | PRN

## 2017-07-19 MED ORDER — OXYCODONE-ACETAMINOPHEN 5-325 MG PO TABS
2.0000 | ORAL_TABLET | ORAL | Status: DC | PRN
  Administered 2017-07-22: 2 via ORAL
  Filled 2017-07-19: qty 2

## 2017-07-19 MED ORDER — INSULIN GLARGINE 100 UNIT/ML ~~LOC~~ SOLN
35.0000 [IU] | Freq: Every day | SUBCUTANEOUS | Status: DC
  Administered 2017-07-19 – 2017-07-21 (×3): 35 [IU] via SUBCUTANEOUS
  Filled 2017-07-19 (×3): qty 0.35

## 2017-07-19 MED ORDER — MELOXICAM 15 MG PO TABS: 15.0000 mg | ORAL_TABLET | Freq: Every day | ORAL | Status: DC | PRN

## 2017-07-19 MED ORDER — ACETAMINOPHEN 650 MG RE SUPP: 650.0000 mg | Freq: Four times a day (QID) | RECTAL | Status: DC | PRN

## 2017-07-19 MED ORDER — INSULIN ASPART 100 UNIT/ML ~~LOC~~ SOLN
6.0000 [IU] | Freq: Three times a day (TID) | SUBCUTANEOUS | Status: DC
  Administered 2017-07-20 – 2017-07-21 (×4): 6 [IU] via SUBCUTANEOUS

## 2017-07-19 MED ORDER — POTASSIUM CHLORIDE 10 MEQ/100ML IV SOLN
10.0000 meq | INTRAVENOUS | Status: AC
  Administered 2017-07-19 (×2): 10 meq via INTRAVENOUS
  Filled 2017-07-19 (×2): qty 100

## 2017-07-19 MED ORDER — SODIUM CHLORIDE 0.9 % IV SOLN
INTRAVENOUS | Status: DC
  Administered 2017-07-19: 05:00:00 via INTRAVENOUS

## 2017-07-19 MED ORDER — ALBUTEROL SULFATE (2.5 MG/3ML) 0.083% IN NEBU
2.5000 mg | INHALATION_SOLUTION | Freq: Four times a day (QID) | RESPIRATORY_TRACT | Status: DC | PRN
Start: 2017-07-19 — End: 2017-07-21

## 2017-07-19 MED ORDER — MELOXICAM 7.5 MG PO TABS: 15.0000 mg | ORAL_TABLET | Freq: Every day | ORAL | Status: DC | PRN

## 2017-07-19 MED ORDER — INSULIN ASPART 100 UNIT/ML ~~LOC~~ SOLN
0.0000 [IU] | Freq: Three times a day (TID) | SUBCUTANEOUS | Status: DC
  Administered 2017-07-19: 15 [IU] via SUBCUTANEOUS

## 2017-07-19 MED ORDER — NICOTINE 21 MG/24HR TD PT24
21.0000 mg | MEDICATED_PATCH | Freq: Every day | TRANSDERMAL | Status: DC
  Administered 2017-07-19 – 2017-07-21 (×3): 21 mg via TRANSDERMAL
  Filled 2017-07-19 (×4): qty 1

## 2017-07-19 MED ORDER — POTASSIUM CHLORIDE CRYS ER 20 MEQ PO TBCR
40.0000 meq | EXTENDED_RELEASE_TABLET | Freq: Once | ORAL | Status: AC
  Administered 2017-07-19: 40 meq via ORAL
  Filled 2017-07-19: qty 2

## 2017-07-19 MED ORDER — DEXTROSE-NACL 5-0.45 % IV SOLN: INTRAVENOUS | Status: DC

## 2017-07-19 MED ORDER — INSULIN REGULAR BOLUS VIA INFUSION
0.0000 [IU] | Freq: Three times a day (TID) | INTRAVENOUS | Status: DC
  Administered 2017-07-19: 7 [IU] via INTRAVENOUS
  Filled 2017-07-19: qty 10

## 2017-07-19 MED ORDER — IPRATROPIUM-ALBUTEROL 0.5-2.5 (3) MG/3ML IN SOLN: 3.0000 mL | Freq: Four times a day (QID) | RESPIRATORY_TRACT | Status: DC | PRN

## 2017-07-19 MED ORDER — INSULIN ASPART 100 UNIT/ML ~~LOC~~ SOLN
0.0000 [IU] | Freq: Every day | SUBCUTANEOUS | Status: DC
Start: 2017-07-19 — End: 2017-07-22
  Administered 2017-07-19: 2 [IU] via SUBCUTANEOUS
  Administered 2017-07-20: 4 [IU] via SUBCUTANEOUS
  Administered 2017-07-21: 2 [IU] via SUBCUTANEOUS

## 2017-07-19 MED ORDER — ARIPIPRAZOLE 5 MG PO TABS
10.0000 mg | ORAL_TABLET | Freq: Every day | ORAL | Status: DC
  Administered 2017-07-19 – 2017-07-22 (×4): 10 mg via ORAL
  Filled 2017-07-19: qty 1
  Filled 2017-07-19 (×3): qty 2

## 2017-07-19 MED ORDER — INSULIN ASPART 100 UNIT/ML ~~LOC~~ SOLN: 0.0000 [IU] | Freq: Every day | SUBCUTANEOUS | Status: DC

## 2017-07-19 MED ORDER — LACTATED RINGERS IV BOLUS (SEPSIS)
2000.0000 mL | Freq: Once | INTRAVENOUS | Status: AC
  Administered 2017-07-19: 2000 mL via INTRAVENOUS

## 2017-07-19 MED ORDER — ASPIRIN 325 MG PO TABS
325.0000 mg | ORAL_TABLET | Freq: Every day | ORAL | Status: DC
  Administered 2017-07-19 – 2017-07-22 (×4): 325 mg via ORAL
  Filled 2017-07-19 (×4): qty 1

## 2017-07-19 MED ORDER — DEXTROSE-NACL 5-0.45 % IV SOLN
INTRAVENOUS | Status: DC
  Administered 2017-07-19: 09:00:00 via INTRAVENOUS

## 2017-07-19 MED ORDER — ONDANSETRON HCL 4 MG/2ML IJ SOLN: 4.0000 mg | Freq: Four times a day (QID) | INTRAMUSCULAR | Status: DC | PRN

## 2017-07-19 MED ORDER — INSULIN REGULAR HUMAN 100 UNIT/ML IJ SOLN
INTRAMUSCULAR | Status: DC
  Administered 2017-07-19: 3.6 [IU]/h via INTRAVENOUS

## 2017-07-19 MED ORDER — LACTATED RINGERS IV SOLN: INTRAVENOUS | Status: DC

## 2017-07-19 MED ORDER — INSULIN ASPART 100 UNIT/ML ~~LOC~~ SOLN
0.0000 [IU] | Freq: Three times a day (TID) | SUBCUTANEOUS | Status: DC
Start: 2017-07-20 — End: 2017-07-22
  Administered 2017-07-20: 11 [IU] via SUBCUTANEOUS
  Administered 2017-07-20: 15 [IU] via SUBCUTANEOUS
  Administered 2017-07-21 (×2): 7 [IU] via SUBCUTANEOUS
  Administered 2017-07-21: 20 [IU] via SUBCUTANEOUS
  Administered 2017-07-22: 15 [IU] via SUBCUTANEOUS
  Administered 2017-07-22: 7 [IU] via SUBCUTANEOUS

## 2017-07-19 NOTE — ED Notes (Signed)
Pt refusing to keep cardiac monitor on

## 2017-07-19 NOTE — H&P (Signed)
History and Physical    Kaitlyn Gray ZDG:[id number] DOB: February 26, 1976 DOA: 07/19/2017  Referring MD/NP/PA: Dr. Rhunette Croft PCP: Billee Cashing, MD  Patient coming from: Homeless shelter via EMS  Chief Complaint: Dizzines  I have personally briefly reviewed patient's old medical records in [facility]   HPI: Kaitlyn Gray is a 42 y.o. female with medical history significant of HTN and bipolar disorder; who presents for complaints of dizziness.  Yesterday she reports feeling as though she may pass out with blurred vision and complaints of left-sided chest pain.  She reports that the chest pain symptoms resolved on there own. people at the homeless shelter where she is currently staying with her children called EMS due to her complaints.  She states that for the last couple weeks she has had these intermittent left-sided chest pain that occurred that she describes as sharp and sometimes radiating to her back.  Notes associated symptoms of urinary frequency for which she is forced to wear depends, polydipsia, dry mouth, vaginal itchiness, leg swelling, and muscle cramps.  Denies any vomiting, loss of consciousness, seizure-like activity.  Due to the multiple social stressors in her life she reports smoking cocaine, marijuana, and utilizes tobacco.  ED Course: Admission into the emergency department patient was noted to be afebrile, pulse 75-18, respiration 15-25, blood pressure 122/82-160/104, O2 saturations 95-100% on room air.  Labs revealed WBC 7.6, hemoglobin 11.3, sodium 123, chloride 99, CO2 24, BUN 10, creatinine 1.04, glucose 766, anion gap 9, and troponin 0 0.01. UDS positive for cocaine and marijuana.  Urinalysis was positive for glucose, and negative for ketones or signs of infection chest x-ray showing mild cardiomegaly and bronchiectasis changes without focal consolidation.  Patient was given 2 L of lactated Ringer's and started on an insulin drip.  Review of Systems    Constitutional: Positive for malaise/fatigue. Negative for chills and fever.  HENT: Negative for ear discharge and nosebleeds.   Eyes: Negative for pain and discharge.  Respiratory: Negative for cough and shortness of breath.   Cardiovascular: Positive for chest pain and leg swelling.  Gastrointestinal: Positive for diarrhea. Negative for abdominal pain, nausea and vomiting.  Genitourinary: Positive for frequency. Negative for dysuria.  Musculoskeletal: Positive for myalgias. Negative for falls.  Skin: Negative for itching.  Neurological: Positive for dizziness and sensory change. Negative for focal weakness.  Endo/Heme/Allergies: Positive for polydipsia.  Psychiatric/Behavioral: Positive for substance abuse. The patient is not nervous/anxious.     Past Medical History:  Diagnosis Date  . Bipolar 1 disorder (HCC)   . Hypertension     Past Surgical History:  Procedure Laterality Date  . CESAREAN SECTION       reports that she has been smoking cigarettes.  She has been smoking about 0.10 packs per day. she has never used smokeless tobacco. She reports that she does not drink alcohol or use drugs.  Allergies  Allergen Reactions  . Hydrocodone Itching  . Latex Rash    Family History significant for diabetes  Prior to Admission medications   Medication Sig Start Date End Date Taking? Authorizing Provider  albuterol (PROAIR HFA) 108 (90 Base) MCG/ACT inhaler Inhale 1-2 puffs into the lungs every 6 (six) hours as needed for wheezing or shortness of breath.    [provider]  amoxicillin (AMOXIL) 500 MG tablet Take 1 tablet (500 mg total) by mouth 2 (two) times daily. 04/15/16   Lora Paula, MD  ARIPiprazole (ABILIFY IM) Inject 400 mg into the muscle every 30 (thirty)  days. patient gets this injection at [facility].    [provider]  citalopram (CELEXA) 10 MG tablet Take 1 tablet (10 mg total) by mouth daily. Patient not  taking: Reported on 04/15/2016 03/24/15   Beau Fanny, FNP  divalproex (DEPAKOTE) 500 MG DR tablet Take 1 tablet (500 mg total) by mouth 2 (two) times daily. Patient not taking: Reported on 04/15/2016 03/24/15   Beau Fanny, FNP  ferrous sulfate 325 (65 FE) MG tablet Take 1 tablet (325 mg total) by mouth daily with breakfast. 04/15/16   Lora Paula, MD  hydrOXYzine (ATARAX/VISTARIL) 50 MG tablet Take 1 tablet (50 mg total) by mouth 3 (three) times daily as needed for anxiety. Patient not taking: Reported on 04/15/2016 03/24/15   Withrow, Everardo All, FNP  ibuprofen (ADVIL,MOTRIN) 600 MG tablet Take 1 tablet (600 mg total) by mouth every 6 (six) hours as needed. 01/07/17   Lavera Guise, MD  lisinopril (PRINIVIL,ZESTRIL) 10 MG tablet Take 10 mg by mouth daily.    [provider]  methocarbamol (ROBAXIN) 500 MG tablet Take 1 tablet (500 mg total) by mouth 2 (two) times daily. Patient not taking: Reported on 04/15/2016 02/12/16   Muthersbaugh, Dahlia Client, PA-C  naproxen (NAPROSYN) 500 MG tablet Take 1 tablet (500 mg total) by mouth 2 (two) times daily. 07/14/16   Liberty Handy, PA-C  ondansetron (ZOFRAN) 4 MG tablet Take 1 tablet (4 mg total) by mouth every 6 (six) hours. Prn n/v. 06/20/16   Linna Hoff, MD  oxyCODONE-acetaminophen (PERCOCET/ROXICET) 5-325 MG tablet Take 2 tablets by mouth every 4 (four) hours as needed for severe pain. 07/14/16   Liberty Handy, PA-C  promethazine (PHENERGAN) 25 MG tablet Take 25 mg by mouth every 6 (six) hours as needed for nausea or vomiting.    [provider]  traZODone (DESYREL) 100 MG tablet Take 1 tablet (100 mg total) by mouth at bedtime as needed for sleep. Patient not taking: Reported on 04/15/2016 03/24/15   Withrow, Everardo All, FNP  zolpidem (AMBIEN) 10 MG tablet Take 10 mg by mouth at bedtime as needed for sleep.    [provider]  Cetirizine HCl 10 MG CAPS Take 1 capsule (10 mg total) by mouth daily. Patient not taking:  Reported on 05/14/2014 03/26/13 05/15/14  Renne Crigler, PA-C    Physical Exam:  Constitutional: Obese female who is ill appearing Vitals:   07/19/17 0143 07/19/17 0300 07/19/17 0315 07/19/17 0330  BP: (!) 160/104 (!) 157/96 122/80 (!) 152/90  Pulse: 80 78 75 76  Resp: 15 (!) 23 16 (!) 25  Temp: 98.4 F (36.9 C)     TempSrc: Oral     SpO2: 100% 95% 100% 99%   Eyes: PERRL, lids and conjunctivae normal ENMT: Mucous membranes are dry. Posterior pharynx clear of any exudate or lesions.  Neck: normal, supple, no masses, no thyromegaly Respiratory: clear to auscultation bilaterally, no wheezing, no crackles. Normal respiratory effort. No accessory muscle use.  Cardiovascular: Regular rate and rhythm, no murmurs / rubs / gallops.  1+ lower extremity edema. 2+ pedal pulses. No carotid bruits.  Abdomen: no tenderness, no masses palpated. No hepatosplenomegaly. Bowel sounds positive.  Musculoskeletal: no clubbing / cyanosis. No joint deformity upper and lower extremities. Good ROM, no contractures. Normal muscle tone.  Skin: no rashes, lesions, ulcers. No induration Neurologic: CN 2-12 grossly intact. Sensation intact, DTR normal. Strength 5/5 in all 4.  Psychiatric: Normal judgment and insight.  Alert and oriented x 3. Normal mood.     Labs on Admission: I have personally reviewed following labs and imaging studies  CBC: Recent Labs  Lab 07/19/17 0159  WBC 7.6  HGB 11.3*  HCT 36.2  MCV 83.8  PLT 307   Basic Metabolic Panel: Recent Labs  Lab 07/19/17 0159  NA 123*  K 4.2  CL 90*  CO2 24  GLUCOSE 766*  BUN 10  CREATININE 1.04*  CALCIUM 8.8*   GFR: CrCl cannot be calculated (Unknown ideal weight.). Liver Function Tests: No results for input(s): AST, ALT, ALKPHOS, BILITOT, PROT, ALBUMIN in the last 168 hours. No results for input(s): LIPASE, AMYLASE in the last 168 hours. No results for input(s): AMMONIA in the last 168 hours. Coagulation Profile: No results for  input(s): INR, PROTIME in the last 168 hours. Cardiac Enzymes: No results for input(s): CKTOTAL, CKMB, CKMBINDEX, TROPONINI in the last 168 hours. BNP (last 3 results) No results for input(s): PROBNP in the last 8760 hours. HbA1C: No results for input(s): HGBA1C in the last 72 hours. CBG: Recent Labs  Lab 07/19/17 0139 07/19/17 0356  GLUCAP >600* 571*   Lipid Profile: No results for input(s): CHOL, HDL, LDLCALC, TRIG, CHOLHDL, LDLDIRECT in the last 72 hours. Thyroid Function Tests: No results for input(s): TSH, T4TOTAL, FREET4, T3FREE, THYROIDAB in the last 72 hours. Anemia Panel: No results for input(s): VITAMINB12, FOLATE, FERRITIN, TIBC, IRON, RETICCTPCT in the last 72 hours. Urine analysis:    Component Value Date/Time   COLORURINE COLORLESS (A) 07/19/2017 0246   APPEARANCEUR CLEAR 07/19/2017 0246   LABSPEC 1.024 07/19/2017 0246   PHURINE 7.0 07/19/2017 0246   GLUCOSEU >=500 (A) 07/19/2017 0246   HGBUR NEGATIVE 07/19/2017 0246   BILIRUBINUR NEGATIVE 07/19/2017 0246   KETONESUR NEGATIVE 07/19/2017 0246   PROTEINUR NEGATIVE 07/19/2017 0246   UROBILINOGEN 0.2 10/06/2014 0856   NITRITE NEGATIVE 07/19/2017 0246   LEUKOCYTESUR NEGATIVE 07/19/2017 0246   Sepsis Labs: No results found for this or any previous visit (from the past 240 hour(s)).   Radiological Exams on Admission: Dg Chest 2 View  Result Date: 07/19/2017 CLINICAL DATA:  Chest pain, now resolved.  History of diabetes. EXAM: CHEST  2 VIEW COMPARISON:  Chest radiograph January 07, 2017 FINDINGS: Cardiac silhouette is mildly enlarged and unchanged. Mediastinal silhouette is normal. Mild bronchitic changes without pleural effusion or focal consolidation. No pneumothorax. Soft tissue planes and included osseous structures are non suspicious. IMPRESSION: Mild cardiomegaly. Mild bronchitic changes without focal consolidation. Electronically Signed   By: Awilda Metro M.D.   On: 07/19/2017 02:15    EKG: Independently  reviewed.  Normal sinus rhythm at 79 bpm no significant ST wave changes.  Assessment/Plan Hyperosmolar nonketotic state and new onset diabetes mellitus type 2: Patient found to  have blood sugars elevated to 766 without ketones in urine and normal anion gap.  Patient started on glucose stabilizer protocol with 20 mEq of potassium chloride and 2 L of lactated Ringer bolus given initially. - Admit to stepdown unit  - Glucose stabilizer protocol initiated  - Monitor BMPs, hemoglobin A1c, TSH in a.m.  - Correct electrolytes as needed - Social work and care management consult for barriers of treatment - Diabetes education for new onset diabetes  Chest pain: Now resolved.   Initial troponin and EKG unremarkable. Chest x-ray shows cardiomegaly.  Could be related with recent cocaine use.  - Trend cardiac enzymes - Check echocardiogram in a.m.    Polysubstance abuse: Patient positive for  marijuana and cocaine - Counseled on the need of cessation of illicit drugs - Check HIV, acute hepatitis panel - Nicotine patch  Normocytic normochromic anemia: Hemoglobin 11.3 and patient has no reported complaints of bleeding. - Continue to monitor  Bipolar disorder: Patient reportedly had not been on her medications   Homeless - Social work consult  DVT prophylaxis: lovenox  Code Status: full  Family Communication: none Disposition Plan: tbd  Consults called: none Admission status: inpatient   Clydie Braun MD [facility] Pager [phone]   If 7PM-7AM, please contact night-coverage [email or web address] Password [email or web address]  07/19/2017, 4:21 AM

## 2017-07-19 NOTE — ED Notes (Addendum)
When asked what brought pt to hospital she began listing off sx not associated w/ CP.  Pt c/o polydipsia, fatigue, blurred vision, dry mouth and intermittent HA.  Pt is difficult to redirect.

## 2017-07-19 NOTE — ED Notes (Signed)
Need another gold top tube,  Admitting MD at bedside.

## 2017-07-19 NOTE — ED Notes (Signed)
Delay in lab draw,  Pt in xray. 

## 2017-07-19 NOTE — ED Provider Notes (Addendum)
[facility] EMERGENCY DEPARTMENT Provider Note   CSN: [id number] Arrival date & time: 07/19/17  0133     History   Chief Complaint Chief Complaint  Patient presents with  . Chest Pain    HPI Kaitlyn Gray is a 42 y.o. female.  HPI  42 year old female with history of cocaine abuse, depression, bipolar disorder, hypertension comes in with chief complaint of elevated blood sugars and chest pain.  Patient had a singular episode of chest pain prior to ED arrival.  Chest pain was midsternal, not exertional, and has resolved after a few minutes on its own.  Patient does not have any history of chest pains, and there was no associated shortness of breath.   Patient is noted to have elevated blood sugars.  She states that over the past several days she has been having polydipsia and urinary frequency.  Patient is also having blurry vision through both of her eyes and episodes of dizziness.  Patient has history of gestational diabetes.  She denies any recent illnesses..  Past Medical History:  Diagnosis Date  . Bipolar 1 disorder (HCC)   . 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 Hypertension     Patient Active Problem List   Diagnosis Date Noted  . 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 Cocaine use disorder, severe, dependence (HCC) 03/24/2015  . Cannabis use disorder, moderate, dependence (HCC) 03/24/2015  . MDD (major depressive disorder), recurrent severe, 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    Past Surgical History:  Procedure Laterality Date  . CESAREAN SECTION      OB History    No data available       Home Medications    Prior to Admission medications   Medication Sig Start Date End Date Taking? Authorizing Provider  albuterol (PROAIR HFA) 108 (90 Base) MCG/ACT inhaler Inhale 1-2 puffs into the lungs every 6 (six) hours as needed for wheezing  or shortness of breath.    [provider]  amoxicillin (AMOXIL) 500 MG tablet Take 1 tablet (500 mg total) by mouth 2 (two) times daily. 04/15/16   Lora Paula, MD  ARIPiprazole (ABILIFY IM) Inject 400 mg into the muscle every 30 (thirty) days. patient gets this injection at [facility].    [provider]  citalopram (CELEXA) 10 MG tablet Take 1 tablet (10 mg total) by mouth daily. Patient not taking: Reported on 04/15/2016 03/24/15   Beau Fanny, FNP  divalproex (DEPAKOTE) 500 MG DR tablet Take 1 tablet (500 mg total) by mouth 2 (two) times daily. Patient not taking: Reported on 04/15/2016 03/24/15   Beau Fanny, FNP  ferrous sulfate 325 (65 FE) MG tablet Take 1 tablet (325 mg total) by mouth daily with breakfast. 04/15/16   Lora Paula, MD  hydrOXYzine (ATARAX/VISTARIL) 50 MG tablet Take 1 tablet (50 mg total) by mouth 3 (three) times daily as needed for anxiety. Patient not taking: Reported on 04/15/2016 03/24/15   Withrow, Everardo All, FNP  ibuprofen (ADVIL,MOTRIN) 600 MG tablet Take 1 tablet (600 mg total) by mouth every 6 (six) hours as needed. 01/07/17   Lavera Guise, MD  lisinopril (PRINIVIL,ZESTRIL) 10 MG tablet Take 10 mg by mouth daily.    [provider]  methocarbamol (ROBAXIN) 500 MG tablet Take 1 tablet (500 mg total) by mouth 2 (two) times daily. Patient not taking: Reported on 04/15/2016 02/12/16  Muthersbaugh, Dahlia Client, PA-C  naproxen (NAPROSYN) 500 MG tablet Take 1 tablet (500 mg total) by mouth 2 (two) times daily. 07/14/16   Liberty Handy, PA-C  ondansetron (ZOFRAN) 4 MG tablet Take 1 tablet (4 mg total) by mouth every 6 (six) hours. Prn n/v. 06/20/16   Linna Hoff, MD  oxyCODONE-acetaminophen (PERCOCET/ROXICET) 5-325 MG tablet Take 2 tablets by mouth every 4 (four) hours as needed for severe pain. 07/14/16   Liberty Handy, PA-C  promethazine (PHENERGAN) 25 MG tablet Take 25 mg by mouth every 6 (six)  hours as needed for nausea or vomiting.    [provider]  traZODone (DESYREL) 100 MG tablet Take 1 tablet (100 mg total) by mouth at bedtime as needed for sleep. Patient not taking: Reported on 04/15/2016 03/24/15   Withrow, Everardo All, FNP  zolpidem (AMBIEN) 10 MG tablet Take 10 mg by mouth at bedtime as needed for sleep.    [provider]  Cetirizine HCl 10 MG CAPS Take 1 capsule (10 mg total) by mouth daily. Patient not taking: Reported on 05/14/2014 03/26/13 05/15/14  Renne Crigler, PA-C    Family History No family history on file.  Social History Social History   Tobacco Use  . Smoking status: Current Every Day Smoker    Packs/day: 0.10    Types: Cigarettes  . Smokeless tobacco: Never Used  Substance Use Topics  . Alcohol use: No  . Drug use: No     Allergies   Hydrocodone and Latex   Review of Systems Review of Systems  Constitutional: Positive for activity change.  Eyes: Positive for visual disturbance.  Respiratory: Negative for shortness of breath.   Cardiovascular: Positive for chest pain.  Endocrine: Positive for polydipsia and polyuria.  All other systems reviewed and are negative.    Physical Exam Updated Vital Signs BP (!) 161/80   Pulse 76   Temp 98.4 F (36.9 C) (Oral)   Resp 19   SpO2 98%   Physical Exam  Constitutional: She is oriented to person, place, and time. She appears well-developed.  HENT:  Head: Normocephalic and atraumatic.  Eyes: EOM are normal.  Neck: Normal range of motion. Neck supple.  Cardiovascular: Normal rate and normal pulses.  Pulmonary/Chest: Effort normal.  Abdominal: Bowel sounds are normal.  Neurological: She is alert and oriented to person, place, and time.  Skin: Skin is warm and dry.  Nursing note and vitals reviewed.    ED Treatments / Results  Labs (all labs ordered are listed, but only abnormal results are displayed) Labs Reviewed  BASIC METABOLIC PANEL - Abnormal; Notable for the  following components:      Result Value   Sodium 123 (*)    Chloride 90 (*)    Glucose, Bld 766 (*)    Creatinine, Ser 1.04 (*)    Calcium 8.8 (*)    All other components within normal limits  CBC - Abnormal; Notable for the following components:   Hemoglobin 11.3 (*)    All other components within normal limits  RAPID URINE DRUG SCREEN, HOSP PERFORMED - Abnormal; Notable for the following components:   Cocaine POSITIVE (*)    Tetrahydrocannabinol POSITIVE (*)    All other components within normal limits  URINALYSIS, ROUTINE W REFLEX MICROSCOPIC - Abnormal; Notable for the following components:   Color, Urine COLORLESS (*)    Glucose, UA >=500 (*)    Squamous Epithelial / LPF 0-5 (*)    All other components within normal  limits  CBG MONITORING, ED - Abnormal; Notable for the following components:   Glucose-Capillary >600 (*)    All other components within normal limits  CBG MONITORING, ED - Abnormal; Notable for the following components:   Glucose-Capillary 571 (*)    All other components within normal limits  ETHANOL  VALPROIC ACID LEVEL  HEMOGLOBIN A1C  TROPONIN I  TROPONIN I  TROPONIN I  HEPATIC FUNCTION PANEL  TSH  BASIC METABOLIC PANEL  HEPATITIS PANEL, ACUTE  HIV ANTIBODY (ROUTINE TESTING)  I-STAT TROPONIN, ED  I-STAT BETA HCG BLOOD, ED ([facility], [facility], [facility] ONLY)  I-STAT BETA HCG BLOOD, ED ([facility], [facility], [facility] ONLY)  I-STAT CHEM 8, ED  CBG MONITORING, ED  CBG MONITORING, ED    EKG  EKG Interpretation  Date/Time:  Sunday July 19 2017 01:39:41 EST Ventricular Rate:  79 PR Interval:  146 QRS Duration: 96 QT Interval:  384 QTC Calculation: 440 R Axis:   36 Text Interpretation:  Normal sinus rhythm Normal ECG No acute changes No significant change since last tracing Confirmed by Derwood Kaplan [phone]) on 07/19/2017 2:23:01 AM       Radiology Dg Chest 2 View  Result Date: 07/19/2017 CLINICAL DATA:  Chest pain, now resolved.  History of diabetes. EXAM: CHEST  2 VIEW  COMPARISON:  Chest radiograph January 07, 2017 FINDINGS: Cardiac silhouette is mildly enlarged and unchanged. Mediastinal silhouette is normal. Mild bronchitic changes without pleural effusion or focal consolidation. No pneumothorax. Soft tissue planes and included osseous structures are non suspicious. IMPRESSION: Mild cardiomegaly. Mild bronchitic changes without focal consolidation. Electronically Signed   By: Awilda Metro M.D.   On: 07/19/2017 02:15    Procedures Procedures (including critical care time)  CRITICAL CARE Performed by: Delyla Sandeen   Total critical care time: 41 minutes for sevee hyperglycemia  Critical care time was exclusive of separately billable procedures and treating other patients.  Critical care was necessary to treat or prevent imminent or life-threatening deterioration.  Critical care was time spent personally by me on the following activities: development of treatment plan with patient and/or surrogate as well as nursing, discussions with consultants, evaluation of patient's response to treatment, examination of patient, obtaining history from patient or surrogate, ordering and performing treatments and interventions, ordering and review of laboratory studies, ordering and review of radiographic studies, pulse oximetry and re-evaluation of patient's condition.   Medications Ordered in ED Medications  dextrose 5 %-0.45 % sodium chloride infusion (not administered)  insulin regular bolus via infusion 0-10 Units (not administered)  insulin regular (NOVOLIN R,HUMULIN R) 100 Units in sodium chloride 0.9 % 100 mL (1 Units/mL) infusion (3.6 Units/hr Intravenous New Bag/Given 07/19/17 0458)  dextrose 50 % solution 25 mL (not administered)  enoxaparin (LOVENOX) injection 40 mg (not administered)  acetaminophen (TYLENOL) tablet 650 mg (not administered)    Or  acetaminophen (TYLENOL) suppository 650 mg (not administered)  ondansetron (ZOFRAN) tablet 4 mg (not  administered)    Or  ondansetron (ZOFRAN) injection 4 mg (not administered)  hydrALAZINE (APRESOLINE) injection 10 mg (not administered)  ipratropium-albuterol (DUONEB) 0.5-2.5 (3) MG/3ML nebulizer solution 3 mL (not administered)  0.9 %  sodium chloride infusion ( Intravenous New Bag/Given 07/19/17 0504)  lactated ringers bolus 2,000 mL (0 mLs Intravenous Stopped 07/19/17 0504)  potassium chloride 10 mEq in 100 mL IVPB (0 mEq Intravenous Stopped 07/19/17 0502)     Initial Impression / Assessment and Plan / ED Course  I have reviewed the triage vital signs and the  nursing notes.  Pertinent labs & imaging results that were available during my care of the patient were reviewed by me and considered in my medical decision making (see chart for details).  Clinical Course as of Jul 19 510  Wynelle Link Jul 19, 2017  0510 Patients children are at the shelter. Pt is homeless. I have paged social work to see if they can help.  [AN]    Clinical Course User Index [AN] Derwood Kaplan, MD     Final Clinical Impressions(s) / ED Diagnoses   Final diagnoses:  Acute hyperglycemia  Diabetes mellitus, new onset [facility])    ED Discharge Orders    None       Derwood Kaplan, MD 07/19/17 7564    Derwood Kaplan, MD 07/19/17 [phone]

## 2017-07-19 NOTE — ED Triage Notes (Signed)
Pt transported from homeless for c/o chest pain which resolved after walking around.  Pt reports no hx of DM but CBG high. IV est by EMS.  Pt A & O NAD

## 2017-07-19 NOTE — Progress Notes (Addendum)
Patient admitted to the hospital earlier this morning by Dr. Katrinka Blazing  Patient seen and examined. She denies any nausea or vomiting. No shortness of breath or chest pain. She does have pain in her feet bilaterally which she describes as pins and needs and has been present chronically  Patient admitted to the hospital for chest pain. She has ruled out for ACS with negative cardiac markers and no acute changes on EKG. She was found to be cocaine positive which was likely contributing. Echo has been ordered  Patient was noted to have elevated blood sugars and evidence of hyperosmolar hyperglycemic state. Anion gap was in normal range on admission. She was started on insulin infusion and blood sugars have since improved. A1c is in process. This is a new diagnosis of diabetes for her. Diabetes coordinator has been consulted. She will be transitioned to lantus. Will continue to monitor blood sugars once insulin infusion has been discontinued. She will need further diabetes education. Other social issues include the fact that she is homeless which may make getting her medications challenging. Social work has been consulted to provide resources. Anticipate discharge in the next 24 hours if blood sugars remain stable.  She has a history of bipolar and will be restarted on abilify  [employer]

## 2017-07-19 NOTE — ED Notes (Signed)
Per nurse collect blood at 6am and run repeat istat chem 8

## 2017-07-19 NOTE — Progress Notes (Signed)
  Echocardiogram 2D Echocardiogram has been performed.  Kaitlyn Gray 07/19/2017, 3:08 PM

## 2017-07-19 NOTE — ED Notes (Signed)
Nurse collected green top,  Due to previous collection tube expired.

## 2017-07-19 NOTE — ED Notes (Signed)
Patient yelling hysterically over blood pressure cuff and "cramp in foot".

## 2017-07-19 NOTE — ED Notes (Signed)
Nurse drawing labs. 

## 2017-07-20 DIAGNOSIS — I10 Essential (primary) hypertension: Secondary | ICD-10-CM

## 2017-07-20 LAB — CBC WITH DIFFERENTIAL/PLATELET
BASOS ABS: 0 10*3/uL (ref 0.0–0.1)
Basophils Relative: 0 %
Eosinophils Absolute: 0.1 10*3/uL (ref 0.0–0.7)
Eosinophils Relative: 2 %
HEMATOCRIT: 33.7 % — AB (ref 36.0–46.0)
HEMOGLOBIN: 10.6 g/dL — AB (ref 12.0–15.0)
LYMPHS PCT: 35 %
Lymphs Abs: 2.2 10*3/uL (ref 0.7–4.0)
MCH: 25.9 pg — ABNORMAL LOW (ref 26.0–34.0)
MCHC: 31.5 g/dL (ref 30.0–36.0)
MCV: 82.4 fL (ref 78.0–100.0)
Monocytes Absolute: 0.3 10*3/uL (ref 0.1–1.0)
Monocytes Relative: 4 %
NEUTROS ABS: 3.6 10*3/uL (ref 1.7–7.7)
NEUTROS PCT: 59 %
PLATELETS: 296 10*3/uL (ref 150–400)
RBC: 4.09 MIL/uL (ref 3.87–5.11)
RDW: 15.4 % (ref 11.5–15.5)
WBC: 6.2 10*3/uL (ref 4.0–10.5)

## 2017-07-20 LAB — BASIC METABOLIC PANEL
ANION GAP: 9 (ref 5–15)
BUN: <5 mg/dL — ABNORMAL LOW (ref 6–20)
CHLORIDE: 102 mmol/L (ref 101–111)
CO2: 25 mmol/L (ref 22–32)
Calcium: 8.9 mg/dL (ref 8.9–10.3)
Creatinine, Ser: 0.87 mg/dL (ref 0.44–1.00)
GFR calc Af Amer: >60 mL/min (ref >60)
Glucose, Bld: 293 mg/dL — ABNORMAL HIGH (ref 65–99)
POTASSIUM: 3.7 mmol/L (ref 3.5–5.1)
Sodium: 136 mmol/L (ref 135–145)

## 2017-07-20 LAB — GLUCOSE, CAPILLARY
GLUCOSE-CAPILLARY: 280 mg/dL — AB (ref 65–99)
GLUCOSE-CAPILLARY: 230 mg/dL — AB (ref 65–99)
GLUCOSE-CAPILLARY: 318 mg/dL — AB (ref 65–99)
Glucose-Capillary: 348 mg/dL — ABNORMAL HIGH (ref 65–99)
Glucose-Capillary: 326 mg/dL — ABNORMAL HIGH (ref 65–99)

## 2017-07-20 MED ORDER — MAGNESIUM SULFATE 2 GM/50ML IV SOLN
2.0000 g | Freq: Once | INTRAVENOUS | Status: AC
  Administered 2017-07-20: 2 g via INTRAVENOUS
  Filled 2017-07-20: qty 50

## 2017-07-20 MED ORDER — INSULIN STARTER KIT- PEN NEEDLES (ENGLISH)
1.0000 | Freq: Once | Status: DC
  Filled 2017-07-20: qty 1

## 2017-07-20 MED ORDER — LIVING WELL WITH DIABETES BOOK
Freq: Once | Status: DC
  Filled 2017-07-20: qty 1

## 2017-07-20 MED ORDER — POTASSIUM CHLORIDE CRYS ER 20 MEQ PO TBCR
40.0000 meq | EXTENDED_RELEASE_TABLET | Freq: Once | ORAL | Status: AC
  Administered 2017-07-20: 40 meq via ORAL
  Filled 2017-07-20: qty 2

## 2017-07-20 MED ORDER — AMLODIPINE BESYLATE 5 MG PO TABS
5.0000 mg | ORAL_TABLET | Freq: Every day | ORAL | Status: DC
  Administered 2017-07-21 – 2017-07-22 (×2): 5 mg via ORAL
  Filled 2017-07-20 (×2): qty 1

## 2017-07-20 NOTE — Progress Notes (Signed)
RN notified [facility] of patients arrival to [facility] from [facility].

## 2017-07-20 NOTE — Progress Notes (Signed)
PROGRESS NOTE  Kaitlyn Gray OEC:[id number] DOB: 09-Jul-1975 DOA: 07/19/2017 PCP: Billee Cashing, MD  HPI/Recap of past 24 hours: Kaitlyn Gray is a 42 y.o. female with medical history significant of HTN and bipolar disorder; who presents for complaints of dizziness.  Also  states that for the last couple weeks she has had these intermittent left-sided chest pain sharp and sometimes radiating to her back. Due to the multiple social stressors in her life she reports smoking cocaine, marijuana, and utilizes tobacco.  07/20/17: seen and examined with her significant other at her bedside. Reports left sided chest pain which is reproducible on palpation. Denies dyspnea or palpitations. A1C 12.5. New diabetes diagnosis.  Assessment/Plan: Principal Problem:   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) Active Problems:   Cocaine use disorder, severe, dependence (HCC)   Bipolar disorder (HCC)   Polysubstance abuse (HCC)   Chest pain   Hyperosmolar nonketotic state and new onset diabetes mellitus type 2:  -BS 766 on admission without ketones in urine and normal anion gap. -started on glucose stabilizer protocol with 20 mEq of potassium chloride and 2 L of lactated Ringer bolus given initially. - A1C 12.5 -will need insulin on discharge and education on use of insulin -diabetes coordinator consulted -conitnue Diabetes education for new onset diabetes  Chest pain r/o ACS:  -ACS ruled out-most likely musculoskeletal as it is reproducible on palpation -Initial troponin and EKG unremarkable.  -Chest x-ray shows cardiomegaly.   -Could be related to cocaine use.  -cardiac enzymes flat troponin <0.03 x 3 -2D echocardiogram  LVEF 60-65 % (07/19/17)  Polysubstance abuse: Patient positive for marijuana and cocaine - Counseled on the need of cessation of illicit drugs - Check HIV non reactive - Nicotine patch  Normocytic normochromic anemia:  -Hemoglobin  11.3 - patient has no reported complaints of bleeding. - Continue to monitor  Bipolar disorder:  -Patient reportedly had not been on her medications  -abilify 10 mg daily  Homelessness - Social work consult  Hypertension -not on antihypertensive meds at home -amlodipine 5mg  daily started -IV hydralazine prn  Code Status: full  Family Communication: none at bedside  Disposition Plan: Home when mores stable   Consultants:  Diabetes coordinator  Procedures:  none  Antimicrobials:  none  DVT prophylaxis:  SCds   Objective: Vitals:   07/19/17 2318 07/20/17 0000 07/20/17 0122 07/20/17 0605  BP: (!) 160/92  (!) 166/96 (!) 163/88  Pulse: 64 71 75 71  Resp: (!) 23 (!) 22 20 18   Temp: 98.5 F (36.9 C)  98.1 F (36.7 C) 98.5 F (36.9 C)  TempSrc: Oral  Oral Oral  SpO2: 98% 96% 98% 100%  Weight:      Height:        Intake/Output Summary (Last 24 hours) at 07/20/2017 0518 Last data filed at 07/20/2017 0000 Gross per 24 hour  Intake 2212.67 ml  Output -  Net 2212.67 ml   Filed Weights   07/19/17 0634  Weight: 103.8 kg (228 lb 13.4 oz)    Exam:   General:  42 yo AAF WD WN NAD A&O x 3  Cardiovascular: RRR no rubs or gallops   Respiratory: CTA no wheezes or rales  Abdomen: obese soft NT ND NBS x4   Musculoskeletal: moves all 4; non focal  Skin: no rash  Psychiatry: mood is appropriate for condition and setting   Data Reviewed: CBC: Recent Labs  Lab 07/19/17 0159 07/19/17 0601  WBC 7.6  --  HGB 11.3* 10.2*  HCT 36.2 30.0*  MCV 83.8  --   PLT 307  --    Basic Metabolic Panel: Recent Labs  Lab 07/19/17 0159 07/19/17 0601 07/19/17 1110  NA 123* 136 136  K 4.2 3.6 3.2*  CL 90* 100* 103  CO2 24  --  23  GLUCOSE 766* 383* 162*  BUN 10 9 7   CREATININE 1.04* 0.70 0.74  CALCIUM 8.8*  --  8.5*   GFR: Estimated Creatinine Clearance: 107.5 mL/min (by C-G formula based on SCr of 0.74 mg/dL). Liver Function Tests: Recent Labs  Lab  07/19/17 0450  AST 6*  ALT 6*  ALKPHOS 27*  BILITOT 0.3  PROT <3.0*  ALBUMIN 1.1*   No results for input(s): LIPASE, AMYLASE in the last 168 hours. No results for input(s): AMMONIA in the last 168 hours. Coagulation Profile: No results for input(s): INR, PROTIME in the last 168 hours. Cardiac Enzymes: Recent Labs  Lab 07/19/17 0450 07/19/17 1110 07/19/17 1657  TROPONINI <0.03 <0.03 <0.03   BNP (last 3 results) No results for input(s): PROBNP in the last 8760 hours. HbA1C: Recent Labs    07/19/17 1657  HGBA1C 12.6*   CBG: Recent Labs  Lab 07/19/17 1633 07/19/17 1944 07/19/17 2150 07/20/17 0119 07/20/17 0620  GLUCAP 352* 330* 282* 348* 326*   Lipid Profile: No results for input(s): CHOL, HDL, LDLCALC, TRIG, CHOLHDL, LDLDIRECT in the last 72 hours. Thyroid Function Tests: Recent Labs    07/19/17 0450  TSH 0.366   Anemia Panel: No results for input(s): VITAMINB12, FOLATE, FERRITIN, TIBC, IRON, RETICCTPCT in the last 72 hours. Urine analysis:    Component Value Date/Time   COLORURINE COLORLESS (A) 07/19/2017 0246   APPEARANCEUR CLEAR 07/19/2017 0246   LABSPEC 1.024 07/19/2017 0246   PHURINE 7.0 07/19/2017 0246   GLUCOSEU >=500 (A) 07/19/2017 0246   HGBUR NEGATIVE 07/19/2017 0246   BILIRUBINUR NEGATIVE 07/19/2017 0246   KETONESUR NEGATIVE 07/19/2017 0246   PROTEINUR NEGATIVE 07/19/2017 0246   UROBILINOGEN 0.2 10/06/2014 0856   NITRITE NEGATIVE 07/19/2017 0246   LEUKOCYTESUR NEGATIVE 07/19/2017 0246   Sepsis Labs: @LABRCNTIP (procalcitonin:4,lacticidven:4)  ) Recent Results (from the past 240 hour(s))  MRSA PCR Screening     Status: None   Collection Time: 07/19/17  6:36 AM  Result Value Ref Range Status   MRSA by PCR NEGATIVE NEGATIVE Final    Comment:        The GeneXpert MRSA Assay (FDA approved for NASAL specimens only), is one component of a comprehensive MRSA colonization surveillance program. It is not intended to diagnose MRSA infection  nor to guide or monitor treatment for MRSA infections. Performed at [facility], [address], [place], [place] [address]       Studies: No results found.  Scheduled Meds: . ARIPiprazole  10 mg Oral Daily  . aspirin  325 mg Oral Daily  . enoxaparin (LOVENOX) injection  40 mg Subcutaneous Daily  . insulin aspart  0-20 Units Subcutaneous TID WC  . insulin aspart  0-5 Units Subcutaneous QHS  . insulin aspart  6 Units Subcutaneous TID WC  . insulin glargine  35 Units Subcutaneous Daily  . lisinopril  10 mg Oral Daily  . nicotine  21 mg Transdermal Daily    Continuous Infusions: . sodium chloride 100 mL/hr at 07/19/17 2059     LOS: 1 day     Darlin Drop, MD [facility] Pager [phone]  If 7PM-7AM, please contact night-coverage [email or web address] Password  [email or web address] 07/20/2017, 8:07 AM

## 2017-07-20 NOTE — Progress Notes (Signed)
Visited with patient regarding Advanced Directives.  Patient states that she didn't request paperwork and went right back to sleep snoring and etc.  Friend at bedside asleep also.  Left paperwork for patient and shared with her to look through it and if it something she desires more instructions on to please let the nurse know.      07/20/17 1100  Clinical Encounter Type  Visited With Patient (friend at bedside)  Visit Type Initial;Spiritual support

## 2017-07-20 NOTE — Progress Notes (Signed)
Inpatient Diabetes Program Recommendations  AACE/ADA: New Consensus Statement on Inpatient Glycemic Control (2015)  Target Ranges:  Prepandial:   less than 140 mg/dL      Peak postprandial:   less than 180 mg/dL (1-2 hours)      Critically ill patients:  140 - 180 mg/dL   Lab Results  Component Value Date   GLUCAP 280 (H) 07/20/2017   HGBA1C 12.6 (H) 07/19/2017    Review of Glycemic Control  Diabetes history: New onset DM2 Outpatient Diabetes medications: None Current orders for Inpatient glycemic control: Lantus 35 units QD, Novolog 0-20 units tidwc and hs + 6 units tidwc  HgbA1C of 12.6% at diagnosis.  Inpatient Diabetes Program Recommendations:     Increase Lantus to 40 units QD Increase Novolog to 8 units tidwc for meal coverage insulin   Spoke with pt about diagnosis of DM. Pt was lying in bed, and finally did make eye contact. States she has PCP, but may want to change. Discussed importance of making lifestyle changes with diet and exercise to control blood sugars.Pt is homeless which may make getting her insulin challenging. Ordered Living Well book, insulin pen starter kit and OP Diabetes Education consult. RN to provide bedside education for new-onset DM.   Will follow daily.  Thank you. Lorenda Peck, RD, LDN, CDE Inpatient Diabetes Coordinator [phone]

## 2017-07-20 NOTE — Congregational Nurse Program (Signed)
Congregational Nurse Program Note  Date of Encounter: 07/20/2017  Past Medical History: Past Medical History:  Diagnosis Date  . Bipolar 1 disorder (HCC)   . 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 Hypertension     Encounter Details: CNP Questionnaire - 07/20/17 1356      Questionnaire   Patient Status  Not Applicable    Race  Black or African American    Location Patient Served At  [employer]    Uninsured  Not Applicable    Food  No food insecurities    Housing/Utilities  No permanent housing    Transportation  Yes, need transportation assistance    Interpersonal Safety  Yes, feel physically and emotionally safe where you currently live    Medical Provider  Yes    Referrals  Other    ED Visit Averted  Not Applicable    Life-Saving Intervention Made  Not Applicable      Client  was admitted to hospital  For her high CBG. Visited her today and she is doing much better was glad that I encouraged her go to [facility] if did not feel better.She also was thankful for visitt.

## 2017-07-21 LAB — HEPATITIS PANEL, ACUTE
HEP B C IGM: NEGATIVE
HEP B S AG: NEGATIVE
Hep A IgM: NEGATIVE

## 2017-07-21 LAB — BASIC METABOLIC PANEL
Anion gap: 10 (ref 5–15)
BUN: 9 mg/dL (ref 6–20)
CHLORIDE: 101 mmol/L (ref 101–111)
CO2: 23 mmol/L (ref 22–32)
CREATININE: 0.94 mg/dL (ref 0.44–1.00)
Calcium: 8.8 mg/dL — ABNORMAL LOW (ref 8.9–10.3)
GFR calc Af Amer: >60 mL/min (ref >60)
GLUCOSE: 392 mg/dL — AB (ref 65–99)
POTASSIUM: 3.9 mmol/L (ref 3.5–5.1)
SODIUM: 134 mmol/L — AB (ref 135–145)

## 2017-07-21 LAB — GLUCOSE, CAPILLARY
GLUCOSE-CAPILLARY: 223 mg/dL — AB (ref 65–99)
GLUCOSE-CAPILLARY: 237 mg/dL — AB (ref 65–99)
GLUCOSE-CAPILLARY: 205 mg/dL — AB (ref 65–99)
Glucose-Capillary: 390 mg/dL — ABNORMAL HIGH (ref 65–99)
Glucose-Capillary: 212 mg/dL — ABNORMAL HIGH (ref 65–99)
Glucose-Capillary: 211 mg/dL — ABNORMAL HIGH (ref 65–99)

## 2017-07-21 MED ORDER — INSULIN GLARGINE 100 UNIT/ML ~~LOC~~ SOLN
5.0000 [IU] | Freq: Once | SUBCUTANEOUS | Status: AC
  Administered 2017-07-21: 5 [IU] via SUBCUTANEOUS
  Filled 2017-07-21: qty 0.05

## 2017-07-21 MED ORDER — ALBUTEROL SULFATE (2.5 MG/3ML) 0.083% IN NEBU: 2.5000 mg | INHALATION_SOLUTION | Freq: Four times a day (QID) | RESPIRATORY_TRACT | Status: DC | PRN

## 2017-07-21 MED ORDER — INSULIN GLARGINE 100 UNIT/ML ~~LOC~~ SOLN
40.0000 [IU] | Freq: Every day | SUBCUTANEOUS | Status: DC
  Filled 2017-07-21: qty 0.4

## 2017-07-21 MED ORDER — IPRATROPIUM-ALBUTEROL 0.5-2.5 (3) MG/3ML IN SOLN: 3.0000 mL | Freq: Four times a day (QID) | RESPIRATORY_TRACT | Status: DC | PRN

## 2017-07-21 MED ORDER — INSULIN ASPART 100 UNIT/ML ~~LOC~~ SOLN
8.0000 [IU] | Freq: Three times a day (TID) | SUBCUTANEOUS | Status: DC
  Administered 2017-07-21 – 2017-07-22 (×4): 8 [IU] via SUBCUTANEOUS

## 2017-07-21 NOTE — Social Work (Signed)
CSW was referred to patient again for support by RN.  CSW met with patient at bedside to discuss concerns. Pt indicated that she was ready to discharge from hospital and she is tired of being in hospital. CSW used active listening skills and validated patients feelings. CSW discussed reason for hospitalization and focused on her strengths and self-determination. CSW reiterated the recommendations for continued medical treatment to manage diabetes and the clinical teams role. CSW discussed readmission if her diabetes is not managed appropriately. CSW continued to used positive reinforcement when engaging with patient and validated desires to get home.  CSW f/u with RN on same.  CSW signing off.  Kaitlyn Hefty, LCSW Clinical Social Worker [phone]

## 2017-07-21 NOTE — Progress Notes (Signed)
The Patient has become anxious and insist on leaving the hospital AMA.  Patient states that she feels like she is in prison. Dr Sallee Provencal paged and is aware of the patient's behavior. Presently- 1556 patient has now decided to stay. Patient was placed back on continuous cardiac monitor and her needs are being attended to.

## 2017-07-21 NOTE — Progress Notes (Signed)
Patient called complaining IV is hurting and she wants it out. IV taken out upon patients request.

## 2017-07-21 NOTE — Clinical Social Work Note (Signed)
Clinical Social Work Assessment  Patient Details  Name: Kaitlyn Gray MRN: [id number] Date of Birth: 10-08-1975  Date of referral:  07/21/17               Reason for consult:  Housing Concerns/Homelessness, Financial Concerns, Emotional/Coping/Adjustment to Illness                Permission sought to share information with:    Permission granted to share information::  No  Name::        Agency::     Relationship::     Contact Information:     Housing/Transportation Living arrangements for the past 2 months:  Barrister's clerk of Information:  Patient Patient Interpreter Needed:  None Criminal Activity/Legal Involvement Pertinent to Current Situation/Hospitalization:  Yes Significant Relationships:  Significant Other, Dependent Children, Other Family Members Lives with:  Minor Children, Significant Other Do you feel safe going back to the place where you live?  Yes Need for family participation in patient care:  No (Coment)  Care giving concerns:  CSW met with patient at bedside. Pt was engaging and willing to discuss psychosocial issues. Pt newly diagnosed with diabetes and reports hx of HTN and mental health history. Pt resumed psych meds in January and is getting adjusted to those medications and now a new health issue is being introduced into her life.  Pt sad about having to manage more health needs on top of financial and housing insecurity. Pt was residing in a motel with significant other and her dependent children ages 28,10 until they could no longer afford the stay. Pt then went to [facility] and they are providing shelter for her for 30 days and they will more then likely extend her stay after that time. Pt has communicated with [facility] and they confirmed that they will hold her bed until she returns.  She has already signed up for stable housing through the [employer] and is on the waitlist for permanent housing. Pt confirmed [employer].  CSW validated  her efforts in managing her physical, mental and housing situation. CSW encouraged patient to stay connected to her mental health provider as she is new on medications and she confirmed that she will do that. When questioned about her children's whereabouts, pt confirmed that her "cousin"(family friend) has her children with her and the children are good and going to school daily. Pt desires to spend her child's birthday with him 2/7 and hopes that if she is still in hospital, her boyfriend will be able to bring children to hospital. In addition, patient indicated that her son is acting out in school since she is not with him, so she desires to get out of hospital and get back home with her children. CSW validated her concerns and worry about her children. CSW encouraged patient in her health process and working with diabetes coordinator and her medical team to stay on track with her diabetes. Pt agreed to same and thanked CSW for coming to visit.    CSW indicated that when patient is medically ready to dc, CSW will provide transportation assistance back to [facility]. Pt agreed to same.  Pt has temporary housing in [place] and has already began the process for permanent housing through housing authority. Pt has medicaid and has been taking medications for mental health. Pt has a supportive partner and cousin/friend support when she is in the community. Pt has two dependent children. CSW has no other interventions for patient at this time  as she is connected to community resources already.  Social Worker assessment / plan:  CSW will provide transportation assistance when ready to discharge.   Employment status:  Unemployed Forensic scientist:  Medicaid In [place] PT Recommendations:  Not assessed at this time Information / Referral to community resources:  Other (Comment Required)(No resources needed)  Patient/Family's Response to care:  Patient appreciative of CSW meeting with her to discuss her life  stressors. Pt desires to leave hospital when she is medically stable and get back to children. No issues or concerns at this time.  Patient/Family's Understanding of and Emotional Response to Diagnosis, Current Treatment, and Prognosis:  Pt appears to have good understanding of her physical condition. She is newly diagnosed with diabetes and are adjusting to the new condition. Pt has many life stressors that are impacting her mood and coping skills. Pt identified the stress of life and hoping for some stability in housing soon as she is on the waitlist for stable housing. CSW validated her stressful situations and commended her efforts in managing everything. CSW will provide assistance with transport at appropriate time as patient appears to be connected to community resources at this time.   Emotional Assessment Appearance:  Appears stated age Attitude/Demeanor/Rapport:  (Cooperative) Affect (typically observed):  Accepting, Appropriate Orientation:  Oriented to Self, Oriented to Place, Oriented to  Time, Oriented to Situation Alcohol / Substance use:  Illicit Drugs Psych involvement (Current and /or in the community):  Outpatient Provider  Discharge Needs  Concerns to be addressed:  No discharge needs identified, Homelessness, Other (Comment Required Readmission within the last 30 days:  No Current discharge risk:  Inadequate Financial Supports, Homeless, Substance Abuse Barriers to Discharge:  No Barriers Identified   Normajean Baxter, LCSW 07/21/2017, 2:22 PM

## 2017-07-21 NOTE — Progress Notes (Signed)
PROGRESS NOTE  Kaitlyn Gray WOE:[id number] DOB: June 23, 1975 DOA: 07/19/2017 PCP: Ricke Hey, MD  HPI/Recap of past 24 hours: Kaitlyn Gray is a 42 y.o. female with medical history significant of HTN and bipolar disorder; who presents for complaints of dizziness.  Also  states that for the last couple weeks she has had these intermittent left-sided chest pain sharp and sometimes radiating to her back. Due to the multiple social stressors in her life she reports smoking cocaine, marijuana, and utilizes tobacco.  07/21/17: Patient seen alongside her partner.  Patient's nurse, nursing student and the diabetic educator were present during the consultation.  Signs and symptoms of hypoglycemia, and immediate remedy discussed with the patient and the patient's partner extensively and they voiced understanding.  Educated patient about diabetes mellitus and need to comply with management.  Patient's social situation is complicated and rather challenging.  Patient is homeless and lives in a shelter.  Patient's blood sugar control is not optimized.  Will continue diabetes education and optimization of patient's blood sugar for now.  Will continue to need input from diabetes educator, case management and social worker.  Patient discharged will need proper planning.  Patient will likely need home health nursing to monitor patient's medication management on discharge if possible.  I was informed that the patient was requesting to leave the hospital to [place].  The patient later changed her mind, and has decided to stay.  Assessment/Plan: Principal Problem:   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 ([place]) Active Problems:   Cocaine use disorder, severe, dependence ([place])   Bipolar disorder ([place])   Polysubstance abuse ([place])   Chest pain   Hyperosmolar nonketotic state and new onset diabetes mellitus type 2:  -BS 766 on admission without ketones in urine  and normal anion gap. -started on glucose stabilizer protocol with 20 mEq of potassium chloride and 2 L of lactated Ringer bolus given initially. - A1C 12.5 -will need insulin on discharge and education on use of insulin -diabetes coordinator consulted -conitnue Diabetes education for new onset diabetes -Currently see above.  Chest pain r/o ACS:  -ACS ruled out-most likely musculoskeletal as it is reproducible on palpation -Initial troponin and EKG unremarkable.  -Chest x-ray shows cardiomegaly.   -Could be related to cocaine use.  -cardiac enzymes flat troponin <0.03 x 3 -2D echocardiogram  LVEF 60-65 % (07/19/17) -No further chest pain reported.  Polysubstance abuse: Patient positive for marijuana and cocaine - Counseled on the need of cessation of illicit drugs - Check HIV non reactive - Nicotine patch -Continue to counsel patient.  Normocytic normochromic anemia:  -Hemoglobin 11.3 - patient has no reported complaints of bleeding. - Continue to monitor  Bipolar disorder:  -Patient reportedly had not been on her medications  -abilify 10 mg daily  Homelessness - Social work consult  Hypertension -not on antihypertensive meds at home -amlodipine 97m daily started -IV hydralazine prn  Code Status: full  Family Communication: Patient's partner.  Disposition Plan: Patient is reported to be homeless.  Patient is said to live in a shelter.  Patient's discharge will entail improved from case management and social worker.  Ideally, patient would benefit from home health nursing.    Consultants:  Diabetes coordinator  Procedures:  none  Antimicrobials:  none  DVT prophylaxis:  SCds   Objective: Vitals:   07/21/17 0632 07/21/17 0930 07/21/17 0949 07/21/17 0950  BP: (!) 144/76 (!) 146/74 (!) 144/76 (!) 144/76  Pulse: 66 75  Resp: 16 18    Temp: 98.1 F (36.7 C) 97.8 F (36.6 C)    TempSrc: Oral Oral    SpO2: 99% 99%    Weight:      Height:         Intake/Output Summary (Last 24 hours) at 07/21/2017 1728 Last data filed at 07/21/2017 0900 Gross per 24 hour  Intake 480 ml  Output 2000 ml  Net -1520 ml   Filed Weights   07/19/17 0634  Weight: 103.8 kg (228 lb 13.4 oz)    Exam:   General:  42 yo AAF WD WN NAD A&O x 3  Cardiovascular: RRR no rubs or gallops   Respiratory: CTA no wheezes or rales  Abdomen: obese soft NT ND NBS x4   Musculoskeletal: moves all 4; non focal  Skin: no rash  Psychiatry: mood is appropriate for condition and setting   Data Reviewed: CBC: Recent Labs  Lab 07/19/17 0159 07/19/17 0601 07/20/17 0922  WBC 7.6  --  6.2  NEUTROABS  --   --  3.6  HGB 11.3* 10.2* 10.6*  HCT 36.2 30.0* 33.7*  MCV 83.8  --  82.4  PLT 307  --  193   Basic Metabolic Panel: Recent Labs  Lab 07/19/17 0159 07/19/17 0601 07/19/17 1110 07/20/17 0922 07/21/17 0617  NA 123* 136 136 136 134*  K 4.2 3.6 3.2* 3.7 3.9  CL 90* 100* 103 102 101  CO2 24  --  '23 25 23  ' GLUCOSE 766* 383* 162* 293* 392*  BUN '10 9 7 ' <5* 9  CREATININE 1.04* 0.70 0.74 0.87 0.94  CALCIUM 8.8*  --  8.5* 8.9 8.8*   GFR: Estimated Creatinine Clearance: 91.4 mL/min (by C-G formula based on SCr of 0.94 mg/dL). Liver Function Tests: Recent Labs  Lab 07/19/17 0450  AST 6*  ALT 6*  ALKPHOS 27*  BILITOT 0.3  PROT <3.0*  ALBUMIN 1.1*   No results for input(s): LIPASE, AMYLASE in the last 168 hours. No results for input(s): AMMONIA in the last 168 hours. Coagulation Profile: No results for input(s): INR, PROTIME in the last 168 hours. Cardiac Enzymes: Recent Labs  Lab 07/19/17 0450 07/19/17 1110 07/19/17 1657  TROPONINI <0.03 <0.03 <0.03   BNP (last 3 results) No results for input(s): PROBNP in the last 8760 hours. HbA1C: Recent Labs    07/19/17 1657  HGBA1C 12.6*   CBG: Recent Labs  Lab 07/21/17 0627 07/21/17 1105 07/21/17 1447 07/21/17 1618 07/21/17 1700  GLUCAP 390* 223* 237* 212* 211*   Lipid Profile: No  results for input(s): CHOL, HDL, LDLCALC, TRIG, CHOLHDL, LDLDIRECT in the last 72 hours. Thyroid Function Tests: Recent Labs    07/19/17 0450  TSH 0.366   Anemia Panel: No results for input(s): VITAMINB12, FOLATE, FERRITIN, TIBC, IRON, RETICCTPCT in the last 72 hours. Urine analysis:    Component Value Date/Time   COLORURINE COLORLESS (A) 07/19/2017 0246   APPEARANCEUR CLEAR 07/19/2017 0246   LABSPEC 1.024 07/19/2017 0246   PHURINE 7.0 07/19/2017 0246   GLUCOSEU >=500 (A) 07/19/2017 0246   HGBUR NEGATIVE 07/19/2017 0246   BILIRUBINUR NEGATIVE 07/19/2017 0246   KETONESUR NEGATIVE 07/19/2017 0246   PROTEINUR NEGATIVE 07/19/2017 0246   UROBILINOGEN 0.2 10/06/2014 0856   NITRITE NEGATIVE 07/19/2017 0246   LEUKOCYTESUR NEGATIVE 07/19/2017 0246   Sepsis Labs: '@LABRCNTIP' (procalcitonin:4,lacticidven:4)  ) Recent Results (from the past 240 hour(s))  MRSA PCR Screening     Status: None   Collection Time: 07/19/17  6:36 AM  Result Value Ref Range Status   MRSA by PCR NEGATIVE NEGATIVE Final    Comment:        The GeneXpert MRSA Assay (FDA approved for NASAL specimens only), is one component of a comprehensive MRSA colonization surveillance program. It is not intended to diagnose MRSA infection nor to guide or monitor treatment for MRSA infections. Performed at [facility], [place] [address] [place], [place] [address]       Studies: No results found.  Scheduled Meds: . amLODipine  5 mg Oral Daily  . ARIPiprazole  10 mg Oral Daily  . aspirin  325 mg Oral Daily  . enoxaparin (LOVENOX) injection  40 mg Subcutaneous Daily  . insulin aspart  0-20 Units Subcutaneous TID WC  . insulin aspart  0-5 Units Subcutaneous QHS  . insulin aspart  8 Units Subcutaneous TID WC  . [START ON 07/22/2017] insulin glargine  40 Units Subcutaneous Daily  . insulin starter kit- pen needles  1 kit Other Once  . lisinopril  10 mg Oral Daily  . living well with diabetes book    Does not apply Once  . nicotine  21 mg Transdermal Daily    Continuous Infusions: . sodium chloride 100 mL/hr at 07/19/17 2059     LOS: 2 days     Bonnell Public, MD [facility] Pager [phone]  If 7PM-7AM, please contact night-coverage [email or web address] Password [facility] 07/21/2017, 5:28 PM

## 2017-07-22 DIAGNOSIS — E119 Type 2 diabetes mellitus without complications: Secondary | ICD-10-CM

## 2017-07-22 DIAGNOSIS — F1910 Other psychoactive substance abuse, uncomplicated: Secondary | ICD-10-CM

## 2017-07-22 DIAGNOSIS — R079 Chest pain, unspecified: Secondary | ICD-10-CM

## 2017-07-22 DIAGNOSIS — R739 Hyperglycemia, unspecified: Secondary | ICD-10-CM

## 2017-07-22 DIAGNOSIS — F1420 Cocaine dependence, uncomplicated: Secondary | ICD-10-CM

## 2017-07-22 DIAGNOSIS — F319 Bipolar disorder, unspecified: Secondary | ICD-10-CM

## 2017-07-22 DIAGNOSIS — E1101 Type 2 diabetes mellitus with hyperosmolarity with coma: Secondary | ICD-10-CM

## 2017-07-22 LAB — BASIC METABOLIC PANEL
Anion gap: 11 (ref 5–15)
BUN: 11 mg/dL (ref 6–20)
CHLORIDE: 101 mmol/L (ref 101–111)
CO2: 22 mmol/L (ref 22–32)
CREATININE: 0.88 mg/dL (ref 0.44–1.00)
Calcium: 8.7 mg/dL — ABNORMAL LOW (ref 8.9–10.3)
GFR calc non Af Amer: >60 mL/min (ref >60)
Glucose, Bld: 332 mg/dL — ABNORMAL HIGH (ref 65–99)
Potassium: 3.8 mmol/L (ref 3.5–5.1)
SODIUM: 134 mmol/L — AB (ref 135–145)

## 2017-07-22 LAB — GLUCOSE, CAPILLARY
GLUCOSE-CAPILLARY: 325 mg/dL — AB (ref 65–99)
Glucose-Capillary: 242 mg/dL — ABNORMAL HIGH (ref 65–99)

## 2017-07-22 MED ORDER — LISINOPRIL 10 MG PO TABS: 10.0000 mg | ORAL_TABLET | Freq: Every day | ORAL | 0 refills | Status: DC

## 2017-07-22 MED ORDER — INSULIN GLARGINE 100 UNIT/ML ~~LOC~~ SOLN
45.0000 [IU] | Freq: Every day | SUBCUTANEOUS | Status: DC
  Administered 2017-07-22: 45 [IU] via SUBCUTANEOUS
  Filled 2017-07-22: qty 0.45

## 2017-07-22 MED ORDER — INSULIN GLARGINE 100 UNIT/ML SOLOSTAR PEN: 45.0000 [IU] | PEN_INJECTOR | Freq: Every morning | SUBCUTANEOUS | 11 refills | Status: DC

## 2017-07-22 MED ORDER — INSULIN ASPART 100 UNIT/ML FLEXPEN: 8.0000 [IU] | PEN_INJECTOR | Freq: Three times a day (TID) | SUBCUTANEOUS | 11 refills | Status: DC

## 2017-07-22 MED ORDER — METFORMIN HCL 500 MG PO TABS: 500.0000 mg | ORAL_TABLET | Freq: Two times a day (BID) | ORAL | 11 refills | Status: DC

## 2017-07-22 MED ORDER — AMLODIPINE BESYLATE 5 MG PO TABS: 5.0000 mg | ORAL_TABLET | Freq: Every day | ORAL | 0 refills | Status: DC

## 2017-07-22 MED ORDER — INSULIN ASPART 100 UNIT/ML ~~LOC~~ SOLN: 0.0000 [IU] | Freq: Three times a day (TID) | SUBCUTANEOUS | 11 refills | Status: DC

## 2017-07-22 MED ORDER — DOXYCYCLINE MONOHYDRATE 100 MG PO TABS: 100.0000 mg | ORAL_TABLET | Freq: Two times a day (BID) | ORAL | 0 refills | Status: DC

## 2017-07-22 MED ORDER — INSULIN ASPART 100 UNIT/ML ~~LOC~~ SOLN: 0.0000 [IU] | Freq: Every day | SUBCUTANEOUS | 11 refills | Status: DC

## 2017-07-22 MED ORDER — BLOOD GLUCOSE MONITOR KIT: PACK | 0 refills | Status: DC

## 2017-07-22 NOTE — Discharge Summary (Signed)
Physician Discharge Summary  Xavier Fournier VVO:[id number] DOB: [birth year] DOA: 07/19/2017  PCP: Ricke Hey, MD  Admit date: 07/19/2017 Discharge date: 07/22/2017  Admitted From: Home  1 minute speed noting today Disposition: Home  Recommendations for Outpatient Follow-up:  1. Follow-up with PCP in 1 week-I have asked her to record all of her blood sugars and the amount of insulin she gives herself in a log and take this to her PCP next week to see if dosages of insulin need to be adjusted Discharge Condition: Stable CODE STATUS: Full code Consultations:  None   Discharge Diagnoses:  Principal Problem:   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  Active Problems:   Polysubstance abuse    Chest pain   Cocaine use disorder, severe, dependence    Bipolar disorder    Subjective: Has noted a swelling on her right inner thigh which is quite painful.  It has been there for about 2 days and is growing.  No other complaints  Brief Summary: 42 year old female with a history of hypertension and bipolar disorder who presented to the hospital for dizziness and near syncope.  Initial blood work revealed a blood glucose of 766, sodium 123.  Urine drug screen revealed her to be positive for cocaine and marijuana. She was admitted for hyperosmolar nonketotic state secondary to diabetes.  She states that she was not aware that she had diabetes.  Hospital Course:   Type 2 diabetes mellitus uncontrolled -Now adequately controlled on insulin infusion has been discontinued -A1c found to be 12.5 -She has been started on Lantus and NovoLog and has received teaching in regards to diabetes, insulin administration and monitoring blood sugars -Metformin has also been added to decrease insulin resistance -I have advised her in detail to record all blood sugars and amount of insulin given and take this log to her PCP next week -I have further advised her to try to lose  weight which will help better control her diabetes  Right thigh abscess  - she has an area which is about 2 inches in diameter which is tender and appears to be a small abscess/furuncle - I have placed her on doxycycline and recommend that she follow-up with her PCP or return to the ER if it continues to grow despite appropriately taking antibiotics  Chest pain on admission -Noted to be reproducible and suspected to be musculoskeletal  -Unremarkable EKG and troponin -2D echo notes normal EF-see report below  Polysubstance abuse -UDS positive for marijuana and cocaine -Advised to stop using illegal drugs -Advised to stop smoking cigarettes hypertension  Hypertension -Continue lisinopril-amlodipine added  Bipolar disorder -Continue Abilify  Normocytic normochromic anemia -Likely anemia of chronic disease-can be followed as outpatient  Homeless -Resides in a shelter   Discharge Exam: Vitals:   07/21/17 2038 07/22/17 0725  BP: (!) 140/91 (!) 144/78  Pulse: 72 76  Resp: 18 18  Temp: 97.8 F (36.6 C) 98 F (36.7 C)  SpO2: 100% 100%   Vitals:   07/21/17 0949 07/21/17 0950 07/21/17 2038 07/22/17 0725  BP: (!) 144/76 (!) 144/76 (!) 140/91 (!) 144/78  Pulse:   72 76  Resp:   18 18  Temp:   97.8 F (36.6 C) 98 F (36.7 C)  TempSrc:   Oral Oral  SpO2:   100% 100%  Weight:      Height:        General: Pt is alert, awake, not in acute distress Cardiovascular: RRR, S1/S2 +, no  rubs, no gallops Respiratory: CTA bilaterally, no wheezing, no rhonchi Abdominal: Soft, NT, ND, bowel sounds + Extremities: no edema, no cyanosis-small area of swelling on the right inner thigh which is indurated tender and mildly erythematous   Discharge Instructions  Discharge Instructions    Ambulatory referral to Nutrition and Diabetic Education   Complete by:  As directed    Diet - low sodium heart healthy   Complete by:  As directed    Diet Carb Modified   Complete by:  As directed     Increase activity slowly   Complete by:  As directed      Allergies as of 07/22/2017      Reactions   Hydrocodone Itching   Latex Rash      Medication List    TAKE these medications   amLODipine 5 MG tablet Commonly known as:  NORVASC Take 1 tablet (5 mg total) by mouth daily.   ARIPiprazole 10 MG tablet Commonly known as:  ABILIFY Take 10 mg by mouth daily.   blood glucose meter kit and supplies Kit Dispense based on patient and insurance preference. Use up to four times daily as directed. (FOR ICD-9 250.00, 250.01). Please give 3 months supply of lancets and test strips to allow her to check QID.   insulin aspart 100 UNIT/ML injection Commonly known as:  novoLOG Inject 0-20 Units into the skin 3 (three) times daily with meals. CBG 70 - 120: 0 units  CBG 121 - 150: 3 units  CBG 151 - 200: 4 units  CBG 201 - 250: 7 units  CBG 251 - 300: 11 units  CBG 301 - 350: 15 units  CBG 351 - 400: 20 units   insulin aspart 100 UNIT/ML injection Commonly known as:  novoLOG Inject 0-5 Units into the skin at bedtime. CBG 70 - 120: 0 units  CBG 121 - 150: 0 units  CBG 151 - 200: 0 units  CBG 201 - 250: 2 units  CBG 251 - 300: 3 units  CBG 301 - 350: 4 units  CBG 351 - 400: 5 units   insulin aspart 100 UNIT/ML FlexPen Commonly known as:  NOVOLOG FLEXPEN Inject 8 Units into the skin 3 (three) times daily with meals.   Insulin Glargine 100 UNIT/ML Solostar Pen Commonly known as:  LANTUS SOLOSTAR Inject 45 Units into the skin every morning.   lisinopril 10 MG tablet Commonly known as:  PRINIVIL,ZESTRIL Take 1 tablet (10 mg total) by mouth daily.   meloxicam 15 MG tablet Commonly known as:  MOBIC Take 15 mg by mouth daily as needed for pain.   metFORMIN 500 MG tablet Commonly known as:  GLUCOPHAGE Take 1 tablet (500 mg total) by mouth 2 (two) times daily with a meal.   ondansetron 4 MG tablet Commonly known as:  ZOFRAN Take 1 tablet (4 mg total) by mouth every 6 (six)  hours. Prn n/v.   oxyCODONE-acetaminophen 5-325 MG tablet Commonly known as:  PERCOCET/ROXICET Take 2 tablets by mouth every 4 (four) hours as needed for severe pain.   PROAIR HFA 108 (90 Base) MCG/ACT inhaler Generic drug:  albuterol Inhale 1-2 puffs into the lungs every 6 (six) hours as needed for wheezing or shortness of breath.   promethazine 25 MG tablet Commonly known as:  PHENERGAN Take 25 mg by mouth every 6 (six) hours as needed for nausea or vomiting.   zolpidem 10 MG tablet Commonly known as:  AMBIEN Take 10 mg by mouth at  bedtime as needed for sleep.       Allergies  Allergen Reactions  . Hydrocodone Itching  . Latex Rash     Procedures/Studies: 2D echo Left ventricle: The cavity size was normal. Wall thickness was   increased in a pattern of mild LVH. Systolic function was normal.   The estimated ejection fraction was in the range of 60% to 65%.   Wall motion was normal; there were no regional wall motion   abnormalities. Left ventricular diastolic function parameters   were normal. - Aortic valve: Transvalvular velocity was within the normal range.   There was no stenosis. There was mild regurgitation. - Mitral valve: Transvalvular velocity was within the normal range.   There was no evidence for stenosis. There was no regurgitation. - Right ventricle: The cavity size was normal. Wall thickness was   normal. Systolic function was normal. - Atrial septum: No defect or patent foramen ovale was identified   by color flow Doppler. - Tricuspid valve: There was physiologic regurgitation.  Dg Chest 2 View  Result Date: 07/19/2017 CLINICAL DATA:  Chest pain, now resolved.  History of diabetes. EXAM: CHEST  2 VIEW COMPARISON:  Chest radiograph January 07, 2017 FINDINGS: Cardiac silhouette is mildly enlarged and unchanged. Mediastinal silhouette is normal. Mild bronchitic changes without pleural effusion or focal consolidation. No pneumothorax. Soft tissue planes and  included osseous structures are non suspicious. IMPRESSION: Mild cardiomegaly. Mild bronchitic changes without focal consolidation. Electronically Signed   By: Elon Alas M.D.   On: 07/19/2017 02:15      The results of significant diagnostics from this hospitalization (including imaging, microbiology, ancillary and laboratory) are listed below for reference.     Microbiology: Recent Results (from the past 240 hour(s))  MRSA PCR Screening     Status: None   Collection Time: 07/19/17  6:36 AM  Result Value Ref Range Status   MRSA by PCR NEGATIVE NEGATIVE Final    Comment:        The GeneXpert MRSA Assay (FDA approved for NASAL specimens only), is one component of a comprehensive MRSA colonization surveillance program. It is not intended to diagnose MRSA infection nor to guide or monitor treatment for MRSA infections. Performed at [facility], [place] [address], [place], [place] [address]      Labs: BNP (last 3 results) No results for input(s): BNP in the last 8760 hours. Basic Metabolic Panel: Recent Labs  Lab 07/19/17 0159 07/19/17 0601 07/19/17 1110 07/20/17 0922 07/21/17 0617 07/22/17 0652  NA 123* 136 136 136 134* 134*  K 4.2 3.6 3.2* 3.7 3.9 3.8  CL 90* 100* 103 102 101 101  CO2 24  --  '23 25 23 22  ' GLUCOSE 766* 383* 162* 293* 392* 332*  BUN '10 9 7 ' <5* 9 11  CREATININE 1.04* 0.70 0.74 0.87 0.94 0.88  CALCIUM 8.8*  --  8.5* 8.9 8.8* 8.7*   Liver Function Tests: Recent Labs  Lab 07/19/17 0450  AST 6*  ALT 6*  ALKPHOS 27*  BILITOT 0.3  PROT <3.0*  ALBUMIN 1.1*   No results for input(s): LIPASE, AMYLASE in the last 168 hours. No results for input(s): AMMONIA in the last 168 hours. CBC: Recent Labs  Lab 07/19/17 0159 07/19/17 0601 07/20/17 0922  WBC 7.6  --  6.2  NEUTROABS  --   --  3.6  HGB 11.3* 10.2* 10.6*  HCT 36.2 30.0* 33.7*  MCV 83.8  --  82.4  PLT 307  --  296   Cardiac Enzymes: Recent Labs  Lab 07/19/17 0450  07/19/17 1110 07/19/17 1657  TROPONINI <0.03 <0.03 <0.03   BNP: Invalid input(s): POCBNP CBG: Recent Labs  Lab 07/21/17 1447 07/21/17 1618 07/21/17 1700 07/21/17 2042 07/22/17 0736  GLUCAP 237* 212* 211* 205* 325*   D-Dimer No results for input(s): DDIMER in the last 72 hours. Hgb A1c Recent Labs    07/19/17 1657  HGBA1C 12.6*   Lipid Profile No results for input(s): CHOL, HDL, LDLCALC, TRIG, CHOLHDL, LDLDIRECT in the last 72 hours. Thyroid function studies No results for input(s): TSH, T4TOTAL, T3FREE, THYROIDAB in the last 72 hours.  Invalid input(s): FREET3 Anemia work up No results for input(s): VITAMINB12, FOLATE, FERRITIN, TIBC, IRON, RETICCTPCT in the last 72 hours. Urinalysis    Component Value Date/Time   COLORURINE COLORLESS (A) 07/19/2017 0246   APPEARANCEUR CLEAR 07/19/2017 0246   LABSPEC 1.024 07/19/2017 0246   PHURINE 7.0 07/19/2017 0246   GLUCOSEU >=500 (A) 07/19/2017 0246   HGBUR NEGATIVE 07/19/2017 0246   BILIRUBINUR NEGATIVE 07/19/2017 0246   KETONESUR NEGATIVE 07/19/2017 0246   PROTEINUR NEGATIVE 07/19/2017 0246   UROBILINOGEN 0.2 10/06/2014 0856   NITRITE NEGATIVE 07/19/2017 0246   LEUKOCYTESUR NEGATIVE 07/19/2017 0246   Sepsis Labs Invalid input(s): PROCALCITONIN,  WBC,  LACTICIDVEN Microbiology Recent Results (from the past 240 hour(s))  MRSA PCR Screening     Status: None   Collection Time: 07/19/17  6:36 AM  Result Value Ref Range Status   MRSA by PCR NEGATIVE NEGATIVE Final    Comment:        The GeneXpert MRSA Assay (FDA approved for NASAL specimens only), is one component of a comprehensive MRSA colonization surveillance program. It is not intended to diagnose MRSA infection nor to guide or monitor treatment for MRSA infections. Performed at [facility], [place] [address] [place], [place] [address]      Time coordinating discharge: Over 30 minutes  SIGNED:   Debbe Odea, MD  [facility] 07/22/2017, 10:01 AM Pager   If 7PM-7AM, please contact night-coverage [email or web address] Password [email or web address]

## 2017-07-22 NOTE — Progress Notes (Signed)
RN Gave pt discharge instructions Pt stated understanding. Pt not in any distress or pain Waiting for ride.

## 2017-07-22 NOTE — Social Work (Signed)
CSW provided transportation assistance via cab voucher.  CSW will sign off.  Keene Breath, LCSW Clinical Social Worker [phone]

## 2017-07-22 NOTE — Discharge Instructions (Signed)
Avoid foods with a high sugar content.  Try to lose weight.  Check your sugar 3 times a day before each meal and at bedtime and keep a log of these sugars and the amount of insulin you administered.    Blood Glucose Monitoring, Adult Monitoring your blood sugar (glucose) helps you manage your diabetes. It also helps you and your health care provider determine how well your diabetes management plan is working. Blood glucose monitoring involves checking your blood glucose as often as directed, and keeping a record (log) of your results over time. Why should I monitor my blood glucose? Checking your blood glucose regularly can:  Help you understand how food, exercise, illnesses, and medicines affect your blood glucose.  Let you know what your blood glucose is at any time. You can quickly tell if you are having low blood glucose (hypoglycemia) or high blood glucose (hyperglycemia).  Help you and your health care provider adjust your medicines as needed.  When should I check my blood glucose? Follow instructions from your health care provider about how often to check your blood glucose. This may depend on:  The type of diabetes you have.  How well-controlled your diabetes is.  Medicines you are taking.  If you have type 1 diabetes:  Check your blood glucose at least 2 times a day.  Also check your blood glucose: ? Before every insulin injection. ? Before and after exercise. ? Between meals. ? 2 hours after a meal. ? Occasionally between 2:00 a.m. and 3:00 a.m., as directed. ? Before potentially dangerous tasks, like driving or using heavy machinery. ? At bedtime.  You may need to check your blood glucose more often, up to 6-10 times a day: ? If you use an insulin pump. ? If you need multiple daily injections (MDI). ? If your diabetes is not well-controlled. ? If you are ill. ? If you have a history of severe hypoglycemia. ? If you have a history of not knowing when your blood  glucose is getting low (hypoglycemia unawareness). If you have type 2 diabetes:  If you take insulin or other diabetes medicines, check your blood glucose at least 2 times a day.  If you are on intensive insulin therapy, check your blood glucose at least 4 times a day. Occasionally, you may also need to check between 2:00 a.m. and 3:00 a.m., as directed.  Also check your blood glucose: ? Before and after exercise. ? Before potentially dangerous tasks, like driving or using heavy machinery.  You may need to check your blood glucose more often if: ? Your medicine is being adjusted. ? Your diabetes is not well-controlled. ? You are ill. What is a blood glucose log?  A blood glucose log is a record of your blood glucose readings. It helps you and your health care provider: ? Look for patterns in your blood glucose over time. ? Adjust your diabetes management plan as needed.  Every time you check your blood glucose, write down your result and notes about things that may be affecting your blood glucose, such as your diet and exercise for the day.  Most glucose meters store a record of glucose readings in the meter. Some meters allow you to download your records to a computer. How do I check my blood glucose? Follow these steps to get accurate readings of your blood glucose: Supplies needed   Blood glucose meter.  Test strips for your meter. Each meter has its own strips. You must use the  strips that come with your meter.  A needle to prick your finger (lancet). Do not use lancets more than once.  A device that holds the lancet (lancing device).  A journal or log book to write down your results. Procedure  Wash your hands with soap and water.  Prick the side of your finger (not the tip) with the lancet. Use a different finger each time.  Gently rub the finger until a small drop of blood appears.  Follow instructions that come with your meter for inserting the test strip,  applying blood to the strip, and using your blood glucose meter.  Write down your result and any notes. Alternative testing sites  Some meters allow you to use areas of your body other than your finger (alternative sites) to test your blood.  If you think you may have hypoglycemia, or if you have hypoglycemia unawareness, do not use alternative sites. Use your finger instead.  Alternative sites may not be as accurate as the fingers, because blood flow is slower in these areas. This means that the result you get may be delayed, and it may be different from the result that you would get from your finger.  The most common alternative sites are: ? Forearm. ? Thigh. ? Palm of the hand. Additional tips  Always keep your supplies with you.  If you have questions or need help, all blood glucose meters have a 24-hour hotline number that you can call. You may also contact your health care provider.  After you use a few boxes of test strips, adjust (calibrate) your blood glucose meter by following instructions that came with your meter. This information is not intended to replace advice given to you by your health care provider. Make sure you discuss any questions you have with your health care provider. Document Released: 06/05/2003 Document Revised: 12/21/2015 Document Reviewed: 11/12/2015 Elsevier Interactive Patient Education  2017 Elsevier Inc.    Diabetes Mellitus and Nutrition When you have diabetes (diabetes mellitus), it is very important to have healthy eating habits because your blood sugar (glucose) levels are greatly affected by what you eat and drink. Eating healthy foods in the appropriate amounts, at about the same times every day, can help you:  Control your blood glucose.  Lower your risk of heart disease.  Improve your blood pressure.  Reach or maintain a healthy weight.  Every person with diabetes is different, and each person has different needs for a meal plan.  Your health care provider may recommend that you work with a diet and nutrition specialist (dietitian) to make a meal plan that is best for you. Your meal plan may vary depending on factors such as:  The calories you need.  The medicines you take.  Your weight.  Your blood glucose, blood pressure, and cholesterol levels.  Your activity level.  Other health conditions you have, such as heart or kidney disease.  How do carbohydrates affect me? Carbohydrates affect your blood glucose level more than any other type of food. Eating carbohydrates naturally increases the amount of glucose in your blood. Carbohydrate counting is a method for keeping track of how many carbohydrates you eat. Counting carbohydrates is important to keep your blood glucose at a healthy level, especially if you use insulin or take certain oral diabetes medicines. It is important to know how many carbohydrates you can safely have in each meal. This is different for every person. Your dietitian can help you calculate how many carbohydrates you should have  at each meal and for snack. Foods that contain carbohydrates include:  Bread, cereal, rice, pasta, and crackers.  Potatoes and corn.  Peas, beans, and lentils.  Milk and yogurt.  Fruit and juice.  Desserts, such as cakes, cookies, ice cream, and candy.  How does alcohol affect me? Alcohol can cause a sudden decrease in blood glucose (hypoglycemia), especially if you use insulin or take certain oral diabetes medicines. Hypoglycemia can be a life-threatening condition. Symptoms of hypoglycemia (sleepiness, dizziness, and confusion) are similar to symptoms of having too much alcohol. If your health care provider says that alcohol is safe for you, follow these guidelines:  Limit alcohol intake to no more than 1 drink per day for nonpregnant women and 2 drinks per day for men. One drink equals 12 oz of beer, 5 oz of wine, or 1 oz of hard liquor.  Do not drink on an  empty stomach.  Keep yourself hydrated with water, diet soda, or unsweetened iced tea.  Keep in mind that regular soda, juice, and other mixers may contain a lot of sugar and must be counted as carbohydrates.  What are tips for following this plan? Reading food labels  Start by checking the serving size on the label. The amount of calories, carbohydrates, fats, and other nutrients listed on the label are based on one serving of the food. Many foods contain more than one serving per package.  Check the total grams (g) of carbohydrates in one serving. You can calculate the number of servings of carbohydrates in one serving by dividing the total carbohydrates by 15. For example, if a food has 30 g of total carbohydrates, it would be equal to 2 servings of carbohydrates.  Check the number of grams (g) of saturated and trans fats in one serving. Choose foods that have low or no amount of these fats.  Check the number of milligrams (mg) of sodium in one serving. Most people should limit total sodium intake to less than 2,300 mg per day.  Always check the nutrition information of foods labeled as "low-fat" or "nonfat". These foods may be higher in added sugar or refined carbohydrates and should be avoided.  Talk to your dietitian to identify your daily goals for nutrients listed on the label. Shopping  Avoid buying canned, premade, or processed foods. These foods tend to be high in fat, sodium, and added sugar.  Shop around the outside edge of the grocery store. This includes fresh fruits and vegetables, bulk grains, fresh meats, and fresh dairy. Cooking  Use low-heat cooking methods, such as baking, instead of high-heat cooking methods like deep frying.  Cook using healthy oils, such as olive, canola, or sunflower oil.  Avoid cooking with butter, cream, or high-fat meats. Meal planning  Eat meals and snacks regularly, preferably at the same times every day. Avoid going long periods of  time without eating.  Eat foods high in fiber, such as fresh fruits, vegetables, beans, and whole grains. Talk to your dietitian about how many servings of carbohydrates you can eat at each meal.  Eat 4-6 ounces of lean protein each day, such as lean meat, chicken, fish, eggs, or tofu. 1 ounce is equal to 1 ounce of meat, chicken, or fish, 1 egg, or 1/4 cup of tofu.  Eat some foods each day that contain healthy fats, such as avocado, nuts, seeds, and fish. Lifestyle   Check your blood glucose regularly.  Exercise at least 30 minutes 5 or more days each week,  or as told by your health care provider.  Take medicines as told by your health care provider.  Do not use any products that contain nicotine or tobacco, such as cigarettes and e-cigarettes. If you need help quitting, ask your health care provider.  Work with a Veterinary surgeon or diabetes educator to identify strategies to manage stress and any emotional and social challenges. What are some questions to ask my health care provider?  Do I need to meet with a diabetes educator?  Do I need to meet with a dietitian?  What number can I call if I have questions?  When are the best times to check my blood glucose? Where to find more information:  American Diabetes Association: diabetes.org/food-and-fitness/food  Academy of Nutrition and Dietetics: https://www.vargas.com/  [employer] of Diabetes and Digestive and Kidney Diseases (NIH): FindJewelers.cz Summary  A healthy meal plan will help you control your blood glucose and maintain a healthy lifestyle.  Working with a diet and nutrition specialist (dietitian) can help you make a meal plan that is best for you.  Keep in mind that carbohydrates and alcohol have immediate effects on your blood glucose levels. It is important to count carbohydrates and to use alcohol  carefully. This information is not intended to replace advice given to you by your health care provider. Make sure you discuss any questions you have with your health care provider. Document Released: 02/27/2005 Document Revised: 07/07/2016 Document Reviewed: 07/07/2016 Elsevier Interactive Patient Education  [employer].

## 2017-07-22 NOTE — Progress Notes (Signed)
Educated patient about insulin administration for discharge. Patient was able to teach back verbally and demonstrated by administering insulin to self.

## 2017-08-22 ENCOUNTER — Emergency Department (HOSPITAL_COMMUNITY)
Admission: EM | Admit: 2017-08-22 | Discharge: 2017-08-22 | Disposition: A | Discharge status: Home / Self Care | Payer: Medicaid Other | Attending: Emergency Medicine | Admitting: Emergency Medicine

## 2017-08-22 ENCOUNTER — Other Ambulatory Visit: Payer: Self-pay

## 2017-08-22 DIAGNOSIS — R739 Hyperglycemia, unspecified: Secondary | ICD-10-CM

## 2017-08-22 DIAGNOSIS — Z79899 Other long term (current) drug therapy: Secondary | ICD-10-CM | POA: Diagnosis not present

## 2017-08-22 DIAGNOSIS — I10 Essential (primary) hypertension: Secondary | ICD-10-CM | POA: Insufficient documentation

## 2017-08-22 DIAGNOSIS — F17210 Nicotine dependence, cigarettes, uncomplicated: Secondary | ICD-10-CM | POA: Diagnosis not present

## 2017-08-22 DIAGNOSIS — M7918 Myalgia, other site: Secondary | ICD-10-CM | POA: Diagnosis present

## 2017-08-22 DIAGNOSIS — E1165 Type 2 diabetes mellitus with hyperglycemia: Secondary | ICD-10-CM | POA: Insufficient documentation

## 2017-08-22 DIAGNOSIS — Z794 Long term (current) use of insulin: Secondary | ICD-10-CM | POA: Insufficient documentation

## 2017-08-22 LAB — BASIC METABOLIC PANEL
Anion gap: 12 (ref 5–15)
BUN: 9 mg/dL (ref 6–20)
CALCIUM: 8.7 mg/dL — AB (ref 8.9–10.3)
CO2: 23 mmol/L (ref 22–32)
Chloride: 100 mmol/L — ABNORMAL LOW (ref 101–111)
Creatinine, Ser: 0.86 mg/dL (ref 0.44–1.00)
GFR calc Af Amer: >60 mL/min (ref >60)
GLUCOSE: 337 mg/dL — AB (ref 65–99)
Potassium: 3.4 mmol/L — ABNORMAL LOW (ref 3.5–5.1)
Sodium: 135 mmol/L (ref 135–145)

## 2017-08-22 LAB — CBC WITH DIFFERENTIAL/PLATELET
Basophils Absolute: 0 10*3/uL (ref 0.0–0.1)
Basophils Relative: 0 %
EOS PCT: 3 %
Eosinophils Absolute: 0.2 10*3/uL (ref 0.0–0.7)
HEMATOCRIT: 33.2 % — AB (ref 36.0–46.0)
Hemoglobin: 10.3 g/dL — ABNORMAL LOW (ref 12.0–15.0)
LYMPHS ABS: 2.7 10*3/uL (ref 0.7–4.0)
LYMPHS PCT: 40 %
MCH: 25.9 pg — AB (ref 26.0–34.0)
MCHC: 31 g/dL (ref 30.0–36.0)
MCV: 83.4 fL (ref 78.0–100.0)
MONOS PCT: 6 %
Monocytes Absolute: 0.4 10*3/uL (ref 0.1–1.0)
Neutro Abs: 3.5 10*3/uL (ref 1.7–7.7)
Neutrophils Relative %: 51 %
PLATELETS: 370 10*3/uL (ref 150–400)
RBC: 3.98 MIL/uL (ref 3.87–5.11)
RDW: 15.4 % (ref 11.5–15.5)
WBC: 6.9 10*3/uL (ref 4.0–10.5)

## 2017-08-22 LAB — CBG MONITORING, ED
GLUCOSE-CAPILLARY: 356 mg/dL — AB (ref 65–99)
Glucose-Capillary: 317 mg/dL — ABNORMAL HIGH (ref 65–99)

## 2017-08-22 MED ORDER — SODIUM CHLORIDE 0.9 % IV BOLUS (SEPSIS)
1000.0000 mL | Freq: Once | INTRAVENOUS | Status: AC
  Administered 2017-08-22: 1000 mL via INTRAVENOUS

## 2017-08-22 NOTE — Discharge Instructions (Signed)
You need to take your medication! You will not get any better if you do not take it. Follow-up with your primary care doctor. Return here for new concerns.

## 2017-08-22 NOTE — ED Notes (Signed)
Patient refused to sign d/c. States that she only wants to go to the bathroom; was screaming and yelling at staff. Patient was assisted to bathroom and then wheeled to lobby.

## 2017-08-22 NOTE — ED Notes (Addendum)
EMS states that they assisted patient in taking her (8) units of Insulin from her Novolog Pen.

## 2017-08-22 NOTE — ED Triage Notes (Signed)
Patient c/o "feeling bad". Has been non-compliant with medications x2 months per EMS. EMS also states that she has two Novolog pens and a full bottle of metformin at her friend's house (EMS saw these in the refrigerator). Patient states that she cannot take her meds because he is homeless.

## 2017-08-22 NOTE — ED Provider Notes (Signed)
[facility] EMERGENCY DEPARTMENT Provider Note   CSN: [id number] Arrival date & time: 08/22/17  0305     History   Chief Complaint Chief Complaint  Patient presents with  . Generalized Body Aches    HPI Kaitlyn Gray is a 42 y.o. female.  The history is provided by the patient and medical records.    42 y.o. F with hx of bipolar disorder, HTN, DM2, polysubstance abuse, presenting to the ED for "feeling bad".  Patient states she has felt bad over the past several days.  She was recently diagnosed with diabetes.  Reports she is not taking her medication because she is homeless.  When EMS picked her up from friend's house, she had 2 full pens of NovoLog and a full bottle of metformin in the kitchen.  They help her administer some of her own insulin prior to transport here.  When confronted about this she states "it is new to me, I do not like it".  Patient is intermittently falling asleep during exam, she admits to smoking crack last evening.  Past Medical History:  Diagnosis Date  . Bipolar 1 disorder ([place])   . 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 ([place]) 07/19/2017  . Hypertension     Patient Active Problem List   Diagnosis Date Noted  . 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 ([place]) 07/19/2017  . Bipolar disorder ([place]) 07/19/2017  . Polysubstance abuse ([place]) 07/19/2017  . Chest pain 07/19/2017  . Cocaine use disorder, severe, dependence ([place]) 03/24/2015  . Cannabis use disorder, moderate, dependence ([place]) 03/24/2015  . 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 CESAREAN SECTION      OB History    No data available       Home Medications    Prior to Admission medications   Medication Sig Start Date End Date Taking? Authorizing Provider  albuterol (PROAIR HFA) 108 (90 Base) MCG/ACT  inhaler Inhale 1-2 puffs into the lungs every 6 (six) hours as needed for wheezing or shortness of breath.    [provider]  amLODipine (NORVASC) 5 MG tablet Take 1 tablet (5 mg total) by mouth daily. 07/22/17   Debbe Odea, MD  ARIPiprazole (ABILIFY) 10 MG tablet Take 10 mg by mouth daily.    [provider]  blood glucose meter kit and supplies KIT Dispense based on patient and insurance preference. Use up to four times daily as directed. (FOR ICD-9 250.00, 250.01). Please give 3 months supply of lancets and test strips to allow her to check QID. 07/22/17   Debbe Odea, MD  doxycycline (ADOXA) 100 MG tablet Take 1 tablet (100 mg total) by mouth 2 (two) times daily. 07/22/17   Debbe Odea, MD  insulin aspart (NOVOLOG FLEXPEN) 100 UNIT/ML FlexPen Inject 8 Units into the skin 3 (three) times daily with meals. 07/22/17   Debbe Odea, MD  insulin aspart (NOVOLOG) 100 UNIT/ML injection Inject 0-20 Units into the skin 3 (three) times daily with meals. CBG 70 - 120: 0 units  CBG 121 - 150: 3 units  CBG 151 - 200: 4 units  CBG 201 - 250: 7 units  CBG 251 - 300: 11 units  CBG 301 - 350: 15 units  CBG 351 - 400: 20 units 07/22/17   Rizwan, Eunice Blase, MD  insulin aspart (NOVOLOG) 100 UNIT/ML injection Inject 0-5 Units into the  skin at bedtime. CBG 70 - 120: 0 units  CBG 121 - 150: 0 units  CBG 151 - 200: 0 units  CBG 201 - 250: 2 units  CBG 251 - 300: 3 units  CBG 301 - 350: 4 units  CBG 351 - 400: 5 units 07/22/17   Debbe Odea, MD  Insulin Glargine (LANTUS SOLOSTAR) 100 UNIT/ML Solostar Pen Inject 45 Units into the skin every morning. 07/22/17   Debbe Odea, MD  lisinopril (PRINIVIL,ZESTRIL) 10 MG tablet Take 1 tablet (10 mg total) by mouth daily. 07/22/17   Debbe Odea, MD  meloxicam (MOBIC) 15 MG tablet Take 15 mg by mouth daily as needed for pain.    [provider]  metFORMIN (GLUCOPHAGE) 500 MG tablet Take 1 tablet (500 mg total) by mouth 2 (two) times daily with a meal.  07/22/17 07/22/18  Debbe Odea, MD  ondansetron (ZOFRAN) 4 MG tablet Take 1 tablet (4 mg total) by mouth every 6 (six) hours. Prn n/v. 06/20/16   Billy Fischer, MD  oxyCODONE-acetaminophen (PERCOCET/ROXICET) 5-325 MG tablet Take 2 tablets by mouth every 4 (four) hours as needed for severe pain. 07/14/16   Kinnie Feil, PA-C  promethazine (PHENERGAN) 25 MG tablet Take 25 mg by mouth every 6 (six) hours as needed for nausea or vomiting.    [provider]  zolpidem (AMBIEN) 10 MG tablet Take 10 mg by mouth at bedtime as needed for sleep.    [provider]    Family History No family history on file.  Social History Social History   Tobacco Use  . Smoking status: Current Every Day Smoker    Packs/day: 0.50    Types: Cigarettes  . Smokeless tobacco: Never Used  Substance Use Topics  . Alcohol use: No  . Drug use: Yes    Types: Marijuana, Cocaine     Allergies   Hydrocodone and Latex   Review of Systems Review of Systems  Musculoskeletal: Positive for arthralgias and myalgias.  All other systems reviewed and are negative.    Physical Exam Updated Vital Signs BP (!) 157/102 (BP Location: Right Arm)   Pulse 76   Temp 98.1 F (36.7 C) (Oral)   Resp 18   Ht '5\' 4"'  (1.626 m)   Wt 99.8 kg (220 lb)   SpO2 97%   BMI 37.76 kg/m   Physical Exam  Constitutional: She appears well-developed and well-nourished.  Intermittently falling asleep during exam, during awake periods is able to answer questions and follow commands normally  HENT:  Head: Normocephalic and atraumatic.  Mouth/Throat: Oropharynx is clear and moist.  Eyes: Conjunctivae and EOM are normal. Pupils are equal, round, and reactive to light.  Neck: Normal range of motion.  Cardiovascular: Normal rate, regular rhythm and normal heart sounds.  Pulmonary/Chest: Effort normal and breath sounds normal.  Abdominal: Soft. Bowel sounds are normal.  Musculoskeletal: Normal range of motion.  Skin:  Skin is warm and dry.  Psychiatric: She has a normal mood and affect.  Nursing note and vitals reviewed.    ED Treatments / Results  Labs (all labs ordered are listed, but only abnormal results are displayed) Labs Reviewed  CBC WITH DIFFERENTIAL/PLATELET - Abnormal; Notable for the following components:      Result Value   Hemoglobin 10.3 (*)    HCT 33.2 (*)    MCH 25.9 (*)    All other components within normal limits  BASIC METABOLIC PANEL - Abnormal; Notable for the following components:  Potassium 3.4 (*)    Chloride 100 (*)    Glucose, Bld 337 (*)    Calcium 8.7 (*)    All other components within normal limits  CBG MONITORING, ED - Abnormal; Notable for the following components:   Glucose-Capillary 356 (*)    All other components within normal limits  CBG MONITORING, ED - Abnormal; Notable for the following components:   Glucose-Capillary 317 (*)    All other components within normal limits  URINALYSIS, ROUTINE W REFLEX MICROSCOPIC    EKG  EKG Interpretation None       Radiology No results found.  Procedures Procedures (including critical care time)  Medications Ordered in ED Medications - No data to display   Initial Impression / Assessment and Plan / ED Course  I have reviewed the triage vital signs and the nursing notes.  Pertinent labs & imaging results that were available during my care of the patient were reviewed by me and considered in my medical decision making (see chart for details).  42 year old female presenting to the ED "not feeling well" for a while.  Recently hospitalized with new onset diabetes.  She has not been taking her medications, she reports she is homeless.  EMS found 2, unopened NovoLog pins in her friend's refrigerator as well as a full bottle of metformin.  When confronted about this she states "it is all new for me and I do not want to take it".  I had a long discussion with her that she will not get any better regards to her  diabetes if she is not compliant with her medications.  She was given IV fluids here.  Labs do not reveal any signs or symptoms concerning for DKA.  Again discussed with her importance of medication compliance.  She can follow-up with her primary care doctor.  Discussed plan with patient, she acknowledged understanding and agreed with plan of care.  Return precautions given for new or worsening symptoms.  Final Clinical Impressions(s) / ED Diagnoses   Final diagnoses:  Hyperglycemia    ED Discharge Orders    None       Larene Pickett, PA-C 08/22/17 0559    Orpah Greek, MD 08/22/17 [phone]

## 2017-09-28 ENCOUNTER — Emergency Department (HOSPITAL_COMMUNITY)
Admission: EM | Admit: 2017-09-28 | Discharge: 2017-09-28 | Disposition: A | Discharge status: Home / Self Care | Payer: Medicaid Other | Attending: Emergency Medicine | Admitting: Emergency Medicine

## 2017-09-28 ENCOUNTER — Encounter (HOSPITAL_COMMUNITY): Payer: Self-pay | Admitting: Emergency Medicine

## 2017-09-28 DIAGNOSIS — Z760 Encounter for issue of repeat prescription: Secondary | ICD-10-CM | POA: Insufficient documentation

## 2017-09-28 DIAGNOSIS — Z794 Long term (current) use of insulin: Secondary | ICD-10-CM | POA: Insufficient documentation

## 2017-09-28 DIAGNOSIS — R5383 Other fatigue: Secondary | ICD-10-CM

## 2017-09-28 DIAGNOSIS — R739 Hyperglycemia, unspecified: Secondary | ICD-10-CM

## 2017-09-28 DIAGNOSIS — Z79899 Other long term (current) drug therapy: Secondary | ICD-10-CM | POA: Insufficient documentation

## 2017-09-28 DIAGNOSIS — F17210 Nicotine dependence, cigarettes, uncomplicated: Secondary | ICD-10-CM | POA: Insufficient documentation

## 2017-09-28 DIAGNOSIS — E1165 Type 2 diabetes mellitus with hyperglycemia: Secondary | ICD-10-CM | POA: Insufficient documentation

## 2017-09-28 DIAGNOSIS — I10 Essential (primary) hypertension: Secondary | ICD-10-CM | POA: Insufficient documentation

## 2017-09-28 DIAGNOSIS — Z91040 Latex allergy status: Secondary | ICD-10-CM | POA: Insufficient documentation

## 2017-09-28 LAB — CBG MONITORING, ED: Glucose-Capillary: 267 mg/dL — ABNORMAL HIGH (ref 65–99)

## 2017-09-28 MED ORDER — INSULIN GLARGINE 100 UNIT/ML SOLOSTAR PEN: 45.0000 [IU] | PEN_INJECTOR | Freq: Every morning | SUBCUTANEOUS | 0 refills | Status: DC

## 2017-09-28 NOTE — ED Notes (Signed)
Called again for Pt to be triaged. No answer

## 2017-09-28 NOTE — ED Notes (Signed)
Pt understood dc material. NAD noted. Script and doctors note given at [employer] [employer]

## 2017-09-28 NOTE — ED Provider Notes (Signed)
[facility] EMERGENCY DEPARTMENT Provider Note   CSN: [id number] Arrival date & time: 09/28/17  0048     History   Chief Complaint Chief Complaint  Patient presents with  . Fatigue    HPI Kaitlyn Gray is a 42 y.o. female.  The patient presents for complaint of difficulty sleeping due to restlessness, but also complains of fatigue and HA. No fever, congestion, sore throat, cough, SOB. She reports nausea but states that she has nausea frequently. She is requesting a refill of her insulin and reports she has been out of it for 2 days.   The history is provided by the patient. No language interpreter was used.    Past Medical History:  Diagnosis Date  . Bipolar 1 disorder ([place])   . 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 ([place]) 07/19/2017  . Hypertension     Patient Active Problem List   Diagnosis Date Noted  . 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 ([place]) 07/19/2017  . Bipolar disorder ([place]) 07/19/2017  . Polysubstance abuse ([place]) 07/19/2017  . Chest pain 07/19/2017  . Cocaine use disorder, severe, dependence ([place]) 03/24/2015  . Cannabis use disorder, moderate, dependence ([place]) 03/24/2015  . 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 CESAREAN SECTION       OB History   None      Home Medications    Prior to Admission medications   Medication Sig Start Date End Date Taking? Authorizing Provider  albuterol (PROAIR HFA) 108 (90 Base) MCG/ACT inhaler Inhale 1-2 puffs into the lungs every 6 (six) hours as needed for wheezing or shortness of breath.    [provider]  amLODipine (NORVASC) 5 MG tablet Take 1 tablet (5 mg total) by mouth daily. 07/22/17   Debbe Odea, MD  ARIPiprazole (ABILIFY) 10 MG tablet Take 10 mg by mouth daily.    [provider]  blood glucose  meter kit and supplies KIT Dispense based on patient and insurance preference. Use up to four times daily as directed. (FOR ICD-9 250.00, 250.01). Please give 3 months supply of lancets and test strips to allow her to check QID. 07/22/17   Debbe Odea, MD  doxycycline (ADOXA) 100 MG tablet Take 1 tablet (100 mg total) by mouth 2 (two) times daily. 07/22/17   Debbe Odea, MD  insulin aspart (NOVOLOG FLEXPEN) 100 UNIT/ML FlexPen Inject 8 Units into the skin 3 (three) times daily with meals. 07/22/17   Debbe Odea, MD  insulin aspart (NOVOLOG) 100 UNIT/ML injection Inject 0-20 Units into the skin 3 (three) times daily with meals. CBG 70 - 120: 0 units  CBG 121 - 150: 3 units  CBG 151 - 200: 4 units  CBG 201 - 250: 7 units  CBG 251 - 300: 11 units  CBG 301 - 350: 15 units  CBG 351 - 400: 20 units 07/22/17   Rizwan, Eunice Blase, MD  insulin aspart (NOVOLOG) 100 UNIT/ML injection Inject 0-5 Units into the skin at bedtime. CBG 70 - 120: 0 units  CBG 121 - 150: 0 units  CBG 151 - 200: 0 units  CBG 201 - 250: 2 units  CBG 251 - 300: 3 units  CBG 301 - 350: 4 units  CBG 351 - 400: 5 units 07/22/17   Debbe Odea, MD  Insulin Glargine (LANTUS SOLOSTAR) 100 UNIT/ML Solostar Pen Inject 45 Units  into the skin every morning. 07/22/17   Debbe Odea, MD  lisinopril (PRINIVIL,ZESTRIL) 10 MG tablet Take 1 tablet (10 mg total) by mouth daily. 07/22/17   Debbe Odea, MD  meloxicam (MOBIC) 15 MG tablet Take 15 mg by mouth daily as needed for pain.    [provider]  metFORMIN (GLUCOPHAGE) 500 MG tablet Take 1 tablet (500 mg total) by mouth 2 (two) times daily with a meal. 07/22/17 07/22/18  Debbe Odea, MD  ondansetron (ZOFRAN) 4 MG tablet Take 1 tablet (4 mg total) by mouth every 6 (six) hours. Prn n/v. 06/20/16   Billy Fischer, MD  oxyCODONE-acetaminophen (PERCOCET/ROXICET) 5-325 MG tablet Take 2 tablets by mouth every 4 (four) hours as needed for severe pain. 07/14/16   Kinnie Feil, PA-C  promethazine  (PHENERGAN) 25 MG tablet Take 25 mg by mouth every 6 (six) hours as needed for nausea or vomiting.    [provider]  zolpidem (AMBIEN) 10 MG tablet Take 10 mg by mouth at bedtime as needed for sleep.    [provider]    Family History No family history on file.  Social History Social History   Tobacco Use  . Smoking status: Current Every Day Smoker    Packs/day: 0.50    Types: Cigarettes  . Smokeless tobacco: Never Used  Substance Use Topics  . Alcohol use: No  . Drug use: Yes    Types: Marijuana, Cocaine     Allergies   Hydrocodone and Latex   Review of Systems Review of Systems  Constitutional: Positive for fatigue. Negative for chills and fever.  HENT: Negative.   Respiratory: Negative.   Cardiovascular: Negative.   Gastrointestinal: Positive for nausea.  Musculoskeletal: Negative.   Skin: Negative.   Neurological: Positive for headaches.  Psychiatric/Behavioral: Positive for sleep disturbance.     Physical Exam Updated Vital Signs BP (!) 161/101 (BP Location: Right Arm)   Pulse 72   Temp 98.1 F (36.7 C) (Oral)   Resp 18   Ht '5\' 3"'  (1.6 m)   Wt 99.8 kg (220 lb)   LMP 09/21/2017   SpO2 99%   BMI 38.97 kg/m   Physical Exam  Constitutional: She is oriented to person, place, and time. She appears well-developed and well-nourished.  HENT:  Head: Normocephalic.  Neck: Normal range of motion. Neck supple.  Cardiovascular: Normal rate and regular rhythm.  No murmur heard. Pulmonary/Chest: Effort normal and breath sounds normal. No stridor. She has no wheezes.  Abdominal: Soft. Bowel sounds are normal. There is no tenderness. There is no rebound and no guarding.  Musculoskeletal: Normal range of motion.  Neurological: She is alert and oriented to person, place, and time.  Skin: Skin is warm and dry. No rash noted.  Psychiatric: She has a normal mood and affect.     ED Treatments / Results  Labs (all labs ordered are listed, but  only abnormal results are displayed) Labs Reviewed  CBG MONITORING, ED - Abnormal; Notable for the following components:      Result Value   Glucose-Capillary 267 (*)    All other components within normal limits  BASIC METABOLIC PANEL  CBC  URINALYSIS, ROUTINE W REFLEX MICROSCOPIC  BLOOD GAS, VENOUS  RAPID URINE DRUG SCREEN, HOSP PERFORMED  CBG MONITORING, ED  I-STAT BETA HCG BLOOD, ED ([facility], [facility], [facility] ONLY)    EKG None  Radiology No results found.  Procedures Procedures (including critical care time)  Medications Ordered in ED Medications -  No data to display   Initial Impression / Assessment and Plan / ED Course  I have reviewed the triage vital signs and the nursing notes.  Pertinent labs & imaging results that were available during my care of the patient were reviewed by me and considered in my medical decision making (see chart for details).     Patient is sleeping on stretcher, in NAD. At this time she reports nausea but this is unchanged from frequent nausea. She reports headache that has resolved since arrival. She is having excessive fatigue but also trouble sleeping due to restlessness.   The patient has mild hyperglycemia with CBG of 267. VSS, mildly hypertension. She appears well. Will refill Rx for Lantus and provide resource for PCP clinics.   Final Clinical Impressions(s) / ED Diagnoses   Final diagnoses:  None   1. Fatigue 2. Mild hyperglycemia  ED Discharge Orders    None       Charlann Lange, PA-C 09/28/17 8921    Veryl Speak, MD 09/28/17 [phone]

## 2017-09-28 NOTE — ED Notes (Signed)
2 young children running from pt room to WR, pt falling asleep during triage interview.  After triage pt states she does not want any labs drawn, pt states she needs Lantus pens.

## 2017-09-28 NOTE — ED Notes (Signed)
Pt continues to refuse all labs. Pt to WR

## 2017-09-28 NOTE — ED Triage Notes (Signed)
Pt reports HA and fatigue onset yesterday, +nausea, pt states she is having restless episodes when she attempts to go to sleep

## 2017-11-06 ENCOUNTER — Encounter (HOSPITAL_COMMUNITY): Payer: Self-pay | Admitting: *Deleted

## 2017-11-06 ENCOUNTER — Emergency Department (HOSPITAL_COMMUNITY)
Admission: EM | Admit: 2017-11-06 | Discharge: 2017-11-06 | Disposition: A | Discharge status: Home / Self Care | Payer: Self-pay | Attending: Emergency Medicine | Admitting: Emergency Medicine

## 2017-11-06 DIAGNOSIS — Z794 Long term (current) use of insulin: Secondary | ICD-10-CM | POA: Insufficient documentation

## 2017-11-06 DIAGNOSIS — B3731 Acute candidiasis of vulva and vagina: Secondary | ICD-10-CM

## 2017-11-06 DIAGNOSIS — Z91040 Latex allergy status: Secondary | ICD-10-CM | POA: Insufficient documentation

## 2017-11-06 DIAGNOSIS — R739 Hyperglycemia, unspecified: Secondary | ICD-10-CM

## 2017-11-06 DIAGNOSIS — E1165 Type 2 diabetes mellitus with hyperglycemia: Secondary | ICD-10-CM | POA: Insufficient documentation

## 2017-11-06 DIAGNOSIS — N3000 Acute cystitis without hematuria: Secondary | ICD-10-CM | POA: Insufficient documentation

## 2017-11-06 DIAGNOSIS — B373 Candidiasis of vulva and vagina: Secondary | ICD-10-CM | POA: Insufficient documentation

## 2017-11-06 DIAGNOSIS — I10 Essential (primary) hypertension: Secondary | ICD-10-CM | POA: Insufficient documentation

## 2017-11-06 DIAGNOSIS — Z79899 Other long term (current) drug therapy: Secondary | ICD-10-CM | POA: Insufficient documentation

## 2017-11-06 DIAGNOSIS — F17210 Nicotine dependence, cigarettes, uncomplicated: Secondary | ICD-10-CM | POA: Insufficient documentation

## 2017-11-06 LAB — URINALYSIS, ROUTINE W REFLEX MICROSCOPIC
Bilirubin Urine: NEGATIVE
Glucose, UA: >=500 mg/dL — AB
Hgb urine dipstick: NEGATIVE
Ketones, ur: NEGATIVE mg/dL
Nitrite: NEGATIVE
Protein, ur: 30 mg/dL — AB
Specific Gravity, Urine: 1.027 (ref 1.005–1.030)
pH: 5 (ref 5.0–8.0)

## 2017-11-06 LAB — CBC WITH DIFFERENTIAL/PLATELET
Abs Immature Granulocytes: 0 10*3/uL (ref 0.0–0.1)
Basophils Absolute: 0 10*3/uL (ref 0.0–0.1)
Basophils Relative: 0 %
Eosinophils Absolute: 0.1 10*3/uL (ref 0.0–0.7)
Eosinophils Relative: 1 %
HCT: 34.9 % — ABNORMAL LOW (ref 36.0–46.0)
Hemoglobin: 10.6 g/dL — ABNORMAL LOW (ref 12.0–15.0)
Immature Granulocytes: 0 %
Lymphocytes Relative: 31 %
Lymphs Abs: 2.1 10*3/uL (ref 0.7–4.0)
MCH: 25.1 pg — ABNORMAL LOW (ref 26.0–34.0)
MCHC: 30.4 g/dL (ref 30.0–36.0)
MCV: 82.7 fL (ref 78.0–100.0)
Monocytes Absolute: 0.5 10*3/uL (ref 0.1–1.0)
Monocytes Relative: 7 %
Neutro Abs: 4 10*3/uL (ref 1.7–7.7)
Neutrophils Relative %: 61 %
Platelets: 252 10*3/uL (ref 150–400)
RBC: 4.22 MIL/uL (ref 3.87–5.11)
RDW: 15.2 % (ref 11.5–15.5)
WBC: 6.6 10*3/uL (ref 4.0–10.5)

## 2017-11-06 LAB — COMPREHENSIVE METABOLIC PANEL
ALK PHOS: 80 U/L (ref 38–126)
ALT: 11 U/L — AB (ref 14–54)
AST: 11 U/L — ABNORMAL LOW (ref 15–41)
Albumin: 3.4 g/dL — ABNORMAL LOW (ref 3.5–5.0)
Anion gap: 9 (ref 5–15)
BUN: 6 mg/dL (ref 6–20)
CO2: 26 mmol/L (ref 22–32)
Calcium: 9 mg/dL (ref 8.9–10.3)
Chloride: 102 mmol/L (ref 101–111)
Creatinine, Ser: 0.88 mg/dL (ref 0.44–1.00)
GFR calc Af Amer: >60 mL/min (ref >60)
GFR calc non Af Amer: >60 mL/min (ref >60)
Glucose, Bld: 316 mg/dL — ABNORMAL HIGH (ref 65–99)
Potassium: 3.7 mmol/L (ref 3.5–5.1)
SODIUM: 137 mmol/L (ref 135–145)
Total Bilirubin: 0.5 mg/dL (ref 0.3–1.2)
Total Protein: 6.6 g/dL (ref 6.5–8.1)

## 2017-11-06 LAB — POC URINE PREG, ED: Preg Test, Ur: NEGATIVE

## 2017-11-06 LAB — WET PREP, GENITAL
CLUE CELLS WET PREP: NONE SEEN
SPERM: NONE SEEN
TRICH WET PREP: NONE SEEN

## 2017-11-06 LAB — I-STAT VENOUS BLOOD GAS, ED
ACID-BASE EXCESS: 2 mmol/L (ref 0.0–2.0)
Bicarbonate: 27.1 mmol/L (ref 20.0–28.0)
O2 Saturation: 68 %
PO2 VEN: 35 mmHg (ref 32.0–45.0)
TCO2: 28 mmol/L (ref 22–32)
pCO2, Ven: 43.4 mmHg — ABNORMAL LOW (ref 44.0–60.0)
pH, Ven: 7.403 (ref 7.250–7.430)

## 2017-11-06 LAB — CBG MONITORING, ED: Glucose-Capillary: 351 mg/dL — ABNORMAL HIGH (ref 65–99)

## 2017-11-06 LAB — HIV ANTIBODY (ROUTINE TESTING W REFLEX): HIV Screen 4th Generation wRfx: NONREACTIVE

## 2017-11-06 LAB — RPR: RPR: NONREACTIVE

## 2017-11-06 MED ORDER — FLUCONAZOLE 100 MG PO TABS
200.0000 mg | ORAL_TABLET | Freq: Once | ORAL | Status: AC
Start: 2017-11-06 — End: 2017-11-06
  Administered 2017-11-06: 200 mg via ORAL
  Filled 2017-11-06: qty 2

## 2017-11-06 MED ORDER — FLUCONAZOLE 200 MG PO TABS: 200.0000 mg | ORAL_TABLET | Freq: Every day | ORAL | 0 refills | Status: DC

## 2017-11-06 MED ORDER — SODIUM CHLORIDE 0.9 % IV BOLUS
1000.0000 mL | Freq: Once | INTRAVENOUS | Status: AC
  Administered 2017-11-06: 1000 mL via INTRAVENOUS

## 2017-11-06 MED ORDER — SODIUM CHLORIDE 0.9 % IV SOLN
1.0000 g | Freq: Once | INTRAVENOUS | Status: AC
  Administered 2017-11-06: 1 g via INTRAVENOUS
  Filled 2017-11-06: qty 10

## 2017-11-06 NOTE — ED Notes (Signed)
Provider at bedside

## 2017-11-06 NOTE — Discharge Instructions (Addendum)
1. Medications: Diflucan, please fill your insulin prescriptions, usual home medications 2. Treatment: rest, drink plenty of fluids,  3. Follow Up: Please followup with your primary doctor in 1-2 days for discussion of your diagnoses and further evaluation after today's visit; if you do not have a primary care doctor use the resource guide provided to find one; Please return to the ER for worsening symptoms, development abdominal pain, persistent vomiting, continued frequent urination or other concerns.

## 2017-11-06 NOTE — ED Provider Notes (Signed)
[facility] EMERGENCY DEPARTMENT Provider Note   CSN: [id number] Arrival date & time: 11/06/17  0128     History   Chief Complaint Chief Complaint  Patient presents with  . Urinary Frequency    HPI Kaitlyn Gray is a 42 y.o. female with a hx of polio disorder, hypertension, insulin dependent diabetes, polysubstance abuse presents to the Emergency Department complaining of gradual, persistent, progressively worsening polydipsia, polyuria, urinary frequency, urinary urgency onset several weeks ago.  Patient reports associated vaginal itching and dysuria onset approximately 1 week ago.  Patient reports she has been out of her insulin for greater than 1 month.  She reports she was recently diagnosed with diabetes in February 2019.  Patient also reports she is out of her lancets and has not been checking her blood sugar at home.  She denies a history of yeast infection.  She denies fever, chills, headache, neck pain, chest pain, shortness of breath, abdominal pain, nausea, vomiting, diarrhea, weakness, dizziness, syncope.  Patient has not followed up with her primary care provider.  She does report she is sexually active with 2 female partners and is concerned about STDs.  She is not currently using birth control or a condom.  The history is provided by the patient and medical records.    Past Medical History:  Diagnosis Date  . Bipolar 1 disorder ([place])   . 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 ([place]) 07/19/2017  . Hypertension     Patient Active Problem List   Diagnosis Date Noted  . 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 ([place]) 07/19/2017  . Bipolar disorder ([place]) 07/19/2017  . Polysubstance abuse ([place]) 07/19/2017  . Chest pain 07/19/2017  . Cocaine use disorder, severe, dependence ([place]) 03/24/2015  . Cannabis use disorder, moderate, dependence ([place]) 03/24/2015  . MDD (major depressive disorder),  recurrent severe, without psychosis ([place]) 03/24/2015    Past Surgical History:  Procedure Laterality Date  . CESAREAN SECTION       OB History   None      Home Medications    Prior to Admission medications   Medication Sig Start Date End Date Taking? Authorizing Provider  albuterol (PROAIR HFA) 108 (90 Base) MCG/ACT inhaler Inhale 1-2 puffs into the lungs every 6 (six) hours as needed for wheezing or shortness of breath.    [provider]  amLODipine (NORVASC) 5 MG tablet Take 1 tablet (5 mg total) by mouth daily. 07/22/17   Debbe Odea, MD  ARIPiprazole (ABILIFY) 10 MG tablet Take 10 mg by mouth daily.    [provider]  blood glucose meter kit and supplies KIT Dispense based on patient and insurance preference. Use up to four times daily as directed. (FOR ICD-9 250.00, 250.01). Please give 3 months supply of lancets and test strips to allow her to check QID. 07/22/17   Debbe Odea, MD  doxycycline (ADOXA) 100 MG tablet Take 1 tablet (100 mg total) by mouth 2 (two) times daily. 07/22/17   Debbe Odea, MD  fluconazole (DIFLUCAN) 200 MG tablet Take 1 tablet (200 mg total) by mouth daily. 11/06/17   , Jarrett Soho, PA-C  insulin aspart (NOVOLOG FLEXPEN) 100 UNIT/ML FlexPen Inject 8 Units into the skin 3 (three) times daily with meals. 07/22/17   Debbe Odea, MD  insulin aspart (NOVOLOG) 100 UNIT/ML injection Inject 0-20 Units into the skin 3 (three) times daily with meals. CBG 70 - 120: 0 units  CBG 121 -  150: 3 units  CBG 151 - 200: 4 units  CBG 201 - 250: 7 units  CBG 251 - 300: 11 units  CBG 301 - 350: 15 units  CBG 351 - 400: 20 units 07/22/17   Rizwan, Eunice Blase, MD  insulin aspart (NOVOLOG) 100 UNIT/ML injection Inject 0-5 Units into the skin at bedtime. CBG 70 - 120: 0 units  CBG 121 - 150: 0 units  CBG 151 - 200: 0 units  CBG 201 - 250: 2 units  CBG 251 - 300: 3 units  CBG 301 - 350: 4 units  CBG 351 - 400: 5 units 07/22/17   Debbe Odea, MD  Insulin  Glargine (LANTUS SOLOSTAR) 100 UNIT/ML Solostar Pen Inject 45 Units into the skin every morning. 09/28/17   Charlann Lange, PA-C  lisinopril (PRINIVIL,ZESTRIL) 10 MG tablet Take 1 tablet (10 mg total) by mouth daily. 07/22/17   Debbe Odea, MD  meloxicam (MOBIC) 15 MG tablet Take 15 mg by mouth daily as needed for pain.    [provider]  metFORMIN (GLUCOPHAGE) 500 MG tablet Take 1 tablet (500 mg total) by mouth 2 (two) times daily with a meal. 07/22/17 07/22/18  Debbe Odea, MD  ondansetron (ZOFRAN) 4 MG tablet Take 1 tablet (4 mg total) by mouth every 6 (six) hours. Prn n/v. 06/20/16   Billy Fischer, MD  oxyCODONE-acetaminophen (PERCOCET/ROXICET) 5-325 MG tablet Take 2 tablets by mouth every 4 (four) hours as needed for severe pain. 07/14/16   Kinnie Feil, PA-C  promethazine (PHENERGAN) 25 MG tablet Take 25 mg by mouth every 6 (six) hours as needed for nausea or vomiting.    [provider]  zolpidem (AMBIEN) 10 MG tablet Take 10 mg by mouth at bedtime as needed for sleep.    [provider]    Family History No family history on file.  Social History Social History   Tobacco Use  . Smoking status: Current Every Day Smoker    Packs/day: 0.50    Types: Cigarettes  . Smokeless tobacco: Never Used  Substance Use Topics  . Alcohol use: No  . Drug use: Yes    Types: Marijuana, Cocaine     Allergies   Hydrocodone and Latex   Review of Systems Review of Systems  Constitutional: Negative for appetite change, diaphoresis, fatigue, fever and unexpected weight change.  HENT: Negative for mouth sores.   Eyes: Negative for visual disturbance.  Respiratory: Negative for cough, chest tightness, shortness of breath and wheezing.   Cardiovascular: Negative for chest pain.  Gastrointestinal: Negative for abdominal pain, constipation, diarrhea, nausea and vomiting.  Endocrine: Positive for polydipsia and polyuria. Negative for polyphagia.  Genitourinary: Positive  for dysuria, frequency and vaginal pain. Negative for hematuria and urgency.  Musculoskeletal: Negative for back pain and neck stiffness.  Skin: Negative for rash.  Allergic/Immunologic: Negative for immunocompromised state.  Neurological: Negative for syncope, light-headedness and headaches.  Hematological: Does not bruise/bleed easily.  Psychiatric/Behavioral: Negative for sleep disturbance. The patient is not nervous/anxious.      Physical Exam Updated Vital Signs BP (!) 164/102 (BP Location: Left Arm)   Pulse 73   Temp 98.1 F (36.7 C) (Oral)   Resp 18   Wt 99.8 kg (220 lb)   SpO2 100%   BMI 38.97 kg/m   Physical Exam  Constitutional: She appears well-developed and well-nourished. No distress.  Awake, alert, nontoxic appearance  HENT:  Head: Normocephalic and atraumatic.  Mouth/Throat: Oropharynx is clear and moist. Mucous  membranes are dry. No oropharyngeal exudate.  Eyes: Conjunctivae are normal. No scleral icterus.  Neck: Normal range of motion. Neck supple.  Cardiovascular: Normal rate, regular rhythm, normal heart sounds and intact distal pulses.  No murmur heard. Pulmonary/Chest: Effort normal and breath sounds normal. No respiratory distress. She has no wheezes.  Equal chest expansion  Abdominal: Soft. Bowel sounds are normal. She exhibits no mass. There is no tenderness. There is no rebound and no guarding. Hernia confirmed negative in the right inguinal area and confirmed negative in the left inguinal area.  Genitourinary: Uterus normal. No labial fusion. There is rash on the right labia. There is no tenderness or lesion on the right labia. There is rash on the left labia. There is no tenderness or lesion on the left labia. Uterus is not deviated, not enlarged, not fixed and not tender. Cervix exhibits no motion tenderness, no discharge and no friability. Right adnexum displays no mass, no tenderness and no fullness. Left adnexum displays no mass, no tenderness and no  fullness. No erythema, tenderness or bleeding in the vagina. No foreign body in the vagina. No signs of injury around the vagina. Vaginal discharge found.  Genitourinary Comments: Thin, white vaginal discharge pooling in the vault with copious amounts of thick, clumped discharge clinging to the vaginal walls.  Labia are with evidence of significant erythema and Candida with excoriations.  No open wounds.  Musculoskeletal: Normal range of motion. She exhibits no edema.  Lymphadenopathy:       Right: No inguinal adenopathy present.       Left: No inguinal adenopathy present.  Neurological: She is alert.  Speech is clear and goal oriented Moves extremities without ataxia  Skin: Skin is warm and dry. She is not diaphoretic. No erythema.  Psychiatric: She has a normal mood and affect.  Nursing note and vitals reviewed.    ED Treatments / Results  Labs (all labs ordered are listed, but only abnormal results are displayed) Labs Reviewed  WET PREP, GENITAL - Abnormal; Notable for the following components:      Result Value   Yeast Wet Prep HPF POC PRESENT (*)    WBC, Wet Prep HPF POC MANY (*)    All other components within normal limits  URINALYSIS, ROUTINE W REFLEX MICROSCOPIC - Abnormal; Notable for the following components:   APPearance CLOUDY (*)    Glucose, UA >=500 (*)    Protein, ur 30 (*)    Leukocytes, UA LARGE (*)    Bacteria, UA FEW (*)    All other components within normal limits  CBC WITH DIFFERENTIAL/PLATELET - Abnormal; Notable for the following components:   Hemoglobin 10.6 (*)    HCT 34.9 (*)    MCH 25.1 (*)    All other components within normal limits  COMPREHENSIVE METABOLIC PANEL - Abnormal; Notable for the following components:   Glucose, Bld 316 (*)    Albumin 3.4 (*)    AST 11 (*)    ALT 11 (*)    All other components within normal limits  CBG MONITORING, ED - Abnormal; Notable for the following components:   Glucose-Capillary 351 (*)    All other components  within normal limits  I-STAT VENOUS BLOOD GAS, ED - Abnormal; Notable for the following components:   pCO2, Ven 43.4 (*)    All other components within normal limits  URINE CULTURE  BLOOD GAS, VENOUS  RPR  HIV ANTIBODY (ROUTINE TESTING)  POC URINE PREG, ED  GC/CHLAMYDIA PROBE AMP (  [place]) NOT AT [facility]     Procedures Procedures (including critical care time)  Medications Ordered in ED Medications  sodium chloride 0.9 % bolus 1,000 mL (0 mLs Intravenous Stopped 11/06/17 0516)  cefTRIAXone (ROCEPHIN) 1 g in sodium chloride 0.9 % 100 mL IVPB (0 g Intravenous Stopped 11/06/17 0516)  fluconazole (DIFLUCAN) tablet 200 mg (200 mg Oral Given 11/06/17 0520)     Initial Impression / Assessment and Plan / ED Course  I have reviewed the triage vital signs and the nursing notes.  Pertinent labs & imaging results that were available during my care of the patient were reviewed by me and considered in my medical decision making (see chart for details).     Patient presents with multiple complaints including dysuria, polyuria and polydipsia.  Her glucose is 316.  She has a normal anion gap no ketones in her urine.  Venous blood gas shows pH of 7.4.  No evidence of DKA.  Patient's pregnancy test is negative.  She is without leukocytosis, fever, tachycardia or hypotension.  No evidence of sepsis.  Patient does have some anemia but this appears to be baseline.  Pelvic exam shows extensive and widespread vulvovaginal candidiasis.  Patient will be given 7-day course of Diflucan.  Wet prep does show yeast and white blood cells.  Will send gonorrhea and Chlamydia cultures.  Long discussion with patient about the importance of managing her blood sugar.  She reports it is been more than 1 month since she had insulin.  She reports that she has refills however she has not filled them because her insurance will not cover them.  I have encouraged patient to follow-up with both her primary care and the [facility] for further discussion of her medications and assistance with affording them.  I have stressed the importance of managing her blood sugar to prevent DKA and other organ damage.  Patient states understanding.  Discussed reasons to return immediately to the emergency department.  Patient states understanding of this and is in agreement with the plan for discharge home.  Final Clinical Impressions(s) / ED Diagnoses   Final diagnoses:  Hyperglycemia  Acute cystitis without hematuria  Vulvovaginal candidiasis    ED Discharge Orders        Ordered    fluconazole (DIFLUCAN) 200 MG tablet  Daily     11/06/17 0539       Eltha Tingley, Jarrett Soho, PA-C 11/06/17 0543    Palumbo, April, MD 11/06/17 2947

## 2017-11-06 NOTE — ED Triage Notes (Signed)
Pt arrives by ambulance for pain and itching in the vagina as well as painful urination.  Pt is diabetic and her CBG was 357 for ems.  Pt is alert and oriented.

## 2017-11-07 LAB — URINE CULTURE

## 2017-11-10 LAB — GC/CHLAMYDIA PROBE AMP (~~LOC~~) NOT AT ARMC
CHLAMYDIA, DNA PROBE: NEGATIVE
Neisseria Gonorrhea: NEGATIVE

## 2017-12-06 ENCOUNTER — Observation Stay (HOSPITAL_COMMUNITY)
Admission: EM | Admit: 2017-12-06 | Discharge: 2017-12-07 | Disposition: A | Discharge status: Home / Self Care | Payer: Medicaid Other | Attending: Family Medicine | Admitting: Family Medicine

## 2017-12-06 ENCOUNTER — Encounter (HOSPITAL_COMMUNITY): Payer: Self-pay | Admitting: Emergency Medicine

## 2017-12-06 ENCOUNTER — Emergency Department (HOSPITAL_COMMUNITY): Payer: Medicaid Other

## 2017-12-06 DIAGNOSIS — F1910 Other psychoactive substance abuse, uncomplicated: Secondary | ICD-10-CM | POA: Insufficient documentation

## 2017-12-06 DIAGNOSIS — Z794 Long term (current) use of insulin: Secondary | ICD-10-CM | POA: Insufficient documentation

## 2017-12-06 DIAGNOSIS — R072 Precordial pain: Secondary | ICD-10-CM | POA: Diagnosis not present

## 2017-12-06 DIAGNOSIS — Z79899 Other long term (current) drug therapy: Secondary | ICD-10-CM | POA: Insufficient documentation

## 2017-12-06 DIAGNOSIS — I152 Hypertension secondary to endocrine disorders: Secondary | ICD-10-CM

## 2017-12-06 DIAGNOSIS — E1165 Type 2 diabetes mellitus with hyperglycemia: Secondary | ICD-10-CM | POA: Insufficient documentation

## 2017-12-06 DIAGNOSIS — R079 Chest pain, unspecified: Secondary | ICD-10-CM | POA: Diagnosis present

## 2017-12-06 DIAGNOSIS — I10 Essential (primary) hypertension: Secondary | ICD-10-CM | POA: Diagnosis not present

## 2017-12-06 DIAGNOSIS — F17210 Nicotine dependence, cigarettes, uncomplicated: Secondary | ICD-10-CM | POA: Insufficient documentation

## 2017-12-06 DIAGNOSIS — Z91040 Latex allergy status: Secondary | ICD-10-CM | POA: Diagnosis not present

## 2017-12-06 DIAGNOSIS — E119 Type 2 diabetes mellitus without complications: Secondary | ICD-10-CM

## 2017-12-06 DIAGNOSIS — R739 Hyperglycemia, unspecified: Secondary | ICD-10-CM

## 2017-12-06 DIAGNOSIS — E876 Hypokalemia: Secondary | ICD-10-CM | POA: Diagnosis present

## 2017-12-06 LAB — LIPASE, BLOOD: LIPASE: 28 U/L (ref 11–51)

## 2017-12-06 LAB — URINALYSIS, ROUTINE W REFLEX MICROSCOPIC
Bacteria, UA: NONE SEEN
Bilirubin Urine: NEGATIVE
KETONES UR: NEGATIVE mg/dL
NITRITE: POSITIVE — AB
PH: 8 (ref 5.0–8.0)
Protein, ur: NEGATIVE mg/dL
Specific Gravity, Urine: 1.031 — ABNORMAL HIGH (ref 1.005–1.030)

## 2017-12-06 LAB — I-STAT TROPONIN, ED
Troponin i, poc: 0 ng/mL (ref 0.00–0.08)
Troponin i, poc: 0 ng/mL (ref 0.00–0.08)

## 2017-12-06 LAB — MRSA PCR SCREENING: MRSA BY PCR: NEGATIVE

## 2017-12-06 LAB — CBC
HEMATOCRIT: 33.9 % — AB (ref 36.0–46.0)
HEMOGLOBIN: 10.1 g/dL — AB (ref 12.0–15.0)
MCH: 25.1 pg — ABNORMAL LOW (ref 26.0–34.0)
MCHC: 29.8 g/dL — ABNORMAL LOW (ref 30.0–36.0)
MCV: 84.3 fL (ref 78.0–100.0)
Platelets: 255 10*3/uL (ref 150–400)
RBC: 4.02 MIL/uL (ref 3.87–5.11)
RDW: 15.1 % (ref 11.5–15.5)
WBC: 7.1 10*3/uL (ref 4.0–10.5)

## 2017-12-06 LAB — HEPATIC FUNCTION PANEL
ALBUMIN: 3.2 g/dL — AB (ref 3.5–5.0)
ALT: 10 U/L — ABNORMAL LOW (ref 14–54)
AST: 15 U/L (ref 15–41)
Alkaline Phosphatase: 69 U/L (ref 38–126)
Bilirubin, Direct: <0.1 mg/dL — ABNORMAL LOW (ref 0.1–0.5)
TOTAL PROTEIN: 6.3 g/dL — AB (ref 6.5–8.1)
Total Bilirubin: 0.3 mg/dL (ref 0.3–1.2)

## 2017-12-06 LAB — BASIC METABOLIC PANEL
ANION GAP: 8 (ref 5–15)
CHLORIDE: 101 mmol/L (ref 101–111)
CO2: 27 mmol/L (ref 22–32)
Calcium: 8.9 mg/dL (ref 8.9–10.3)
Creatinine, Ser: 0.95 mg/dL (ref 0.44–1.00)
GFR calc Af Amer: >60 mL/min (ref >60)
Glucose, Bld: 377 mg/dL — ABNORMAL HIGH (ref 65–99)
POTASSIUM: 3.4 mmol/L — AB (ref 3.5–5.1)
SODIUM: 136 mmol/L (ref 135–145)

## 2017-12-06 LAB — I-STAT BETA HCG BLOOD, ED (MC, WL, AP ONLY)

## 2017-12-06 LAB — TROPONIN I: Troponin I: <0.03 ng/mL (ref <0.03)

## 2017-12-06 LAB — RAPID URINE DRUG SCREEN, HOSP PERFORMED
AMPHETAMINES: NOT DETECTED
Benzodiazepines: NOT DETECTED
Cocaine: POSITIVE — AB
OPIATES: NOT DETECTED
Tetrahydrocannabinol: POSITIVE — AB

## 2017-12-06 LAB — GLUCOSE, CAPILLARY: GLUCOSE-CAPILLARY: 185 mg/dL — AB (ref 65–99)

## 2017-12-06 LAB — CBG MONITORING, ED: GLUCOSE-CAPILLARY: 322 mg/dL — AB (ref 65–99)

## 2017-12-06 LAB — D-DIMER, QUANTITATIVE: D-Dimer, Quant: 0.79 ug/mL-FEU — ABNORMAL HIGH (ref 0.00–0.50)

## 2017-12-06 IMAGING — DX DG CHEST 2V
2 series · 2 of 2 positions shown · non-contrast
Comparison: [DATE]

CLINICAL DATA: Pt reports she has had right sided chest pain
radiating into the right arm that started at church. Pt states she
is currently homeless and is not taking any of her medications for
diabetes, hypertension or bipolar disorder.

EXAM:
CHEST - 2 VIEW

[w chest lat]
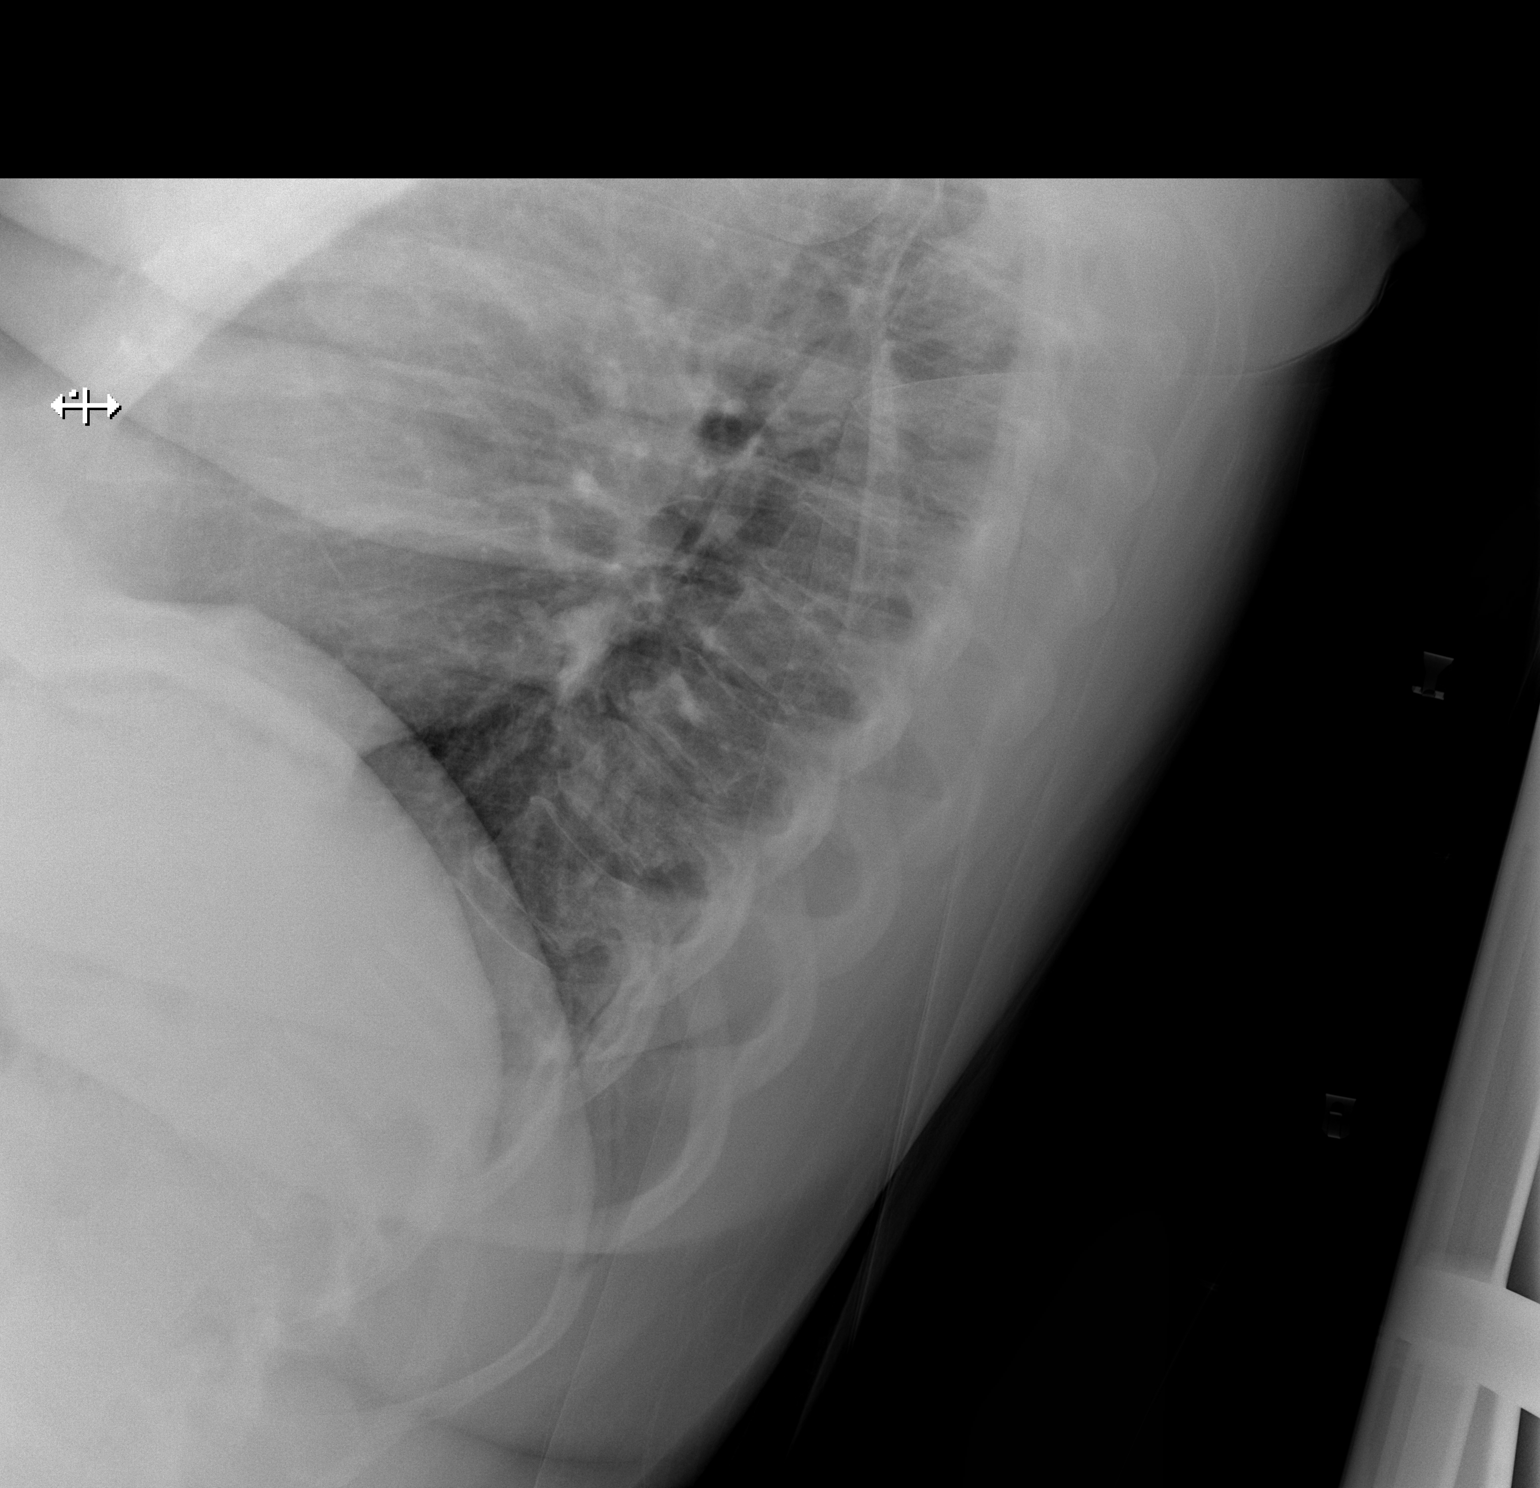

[x chest ap]
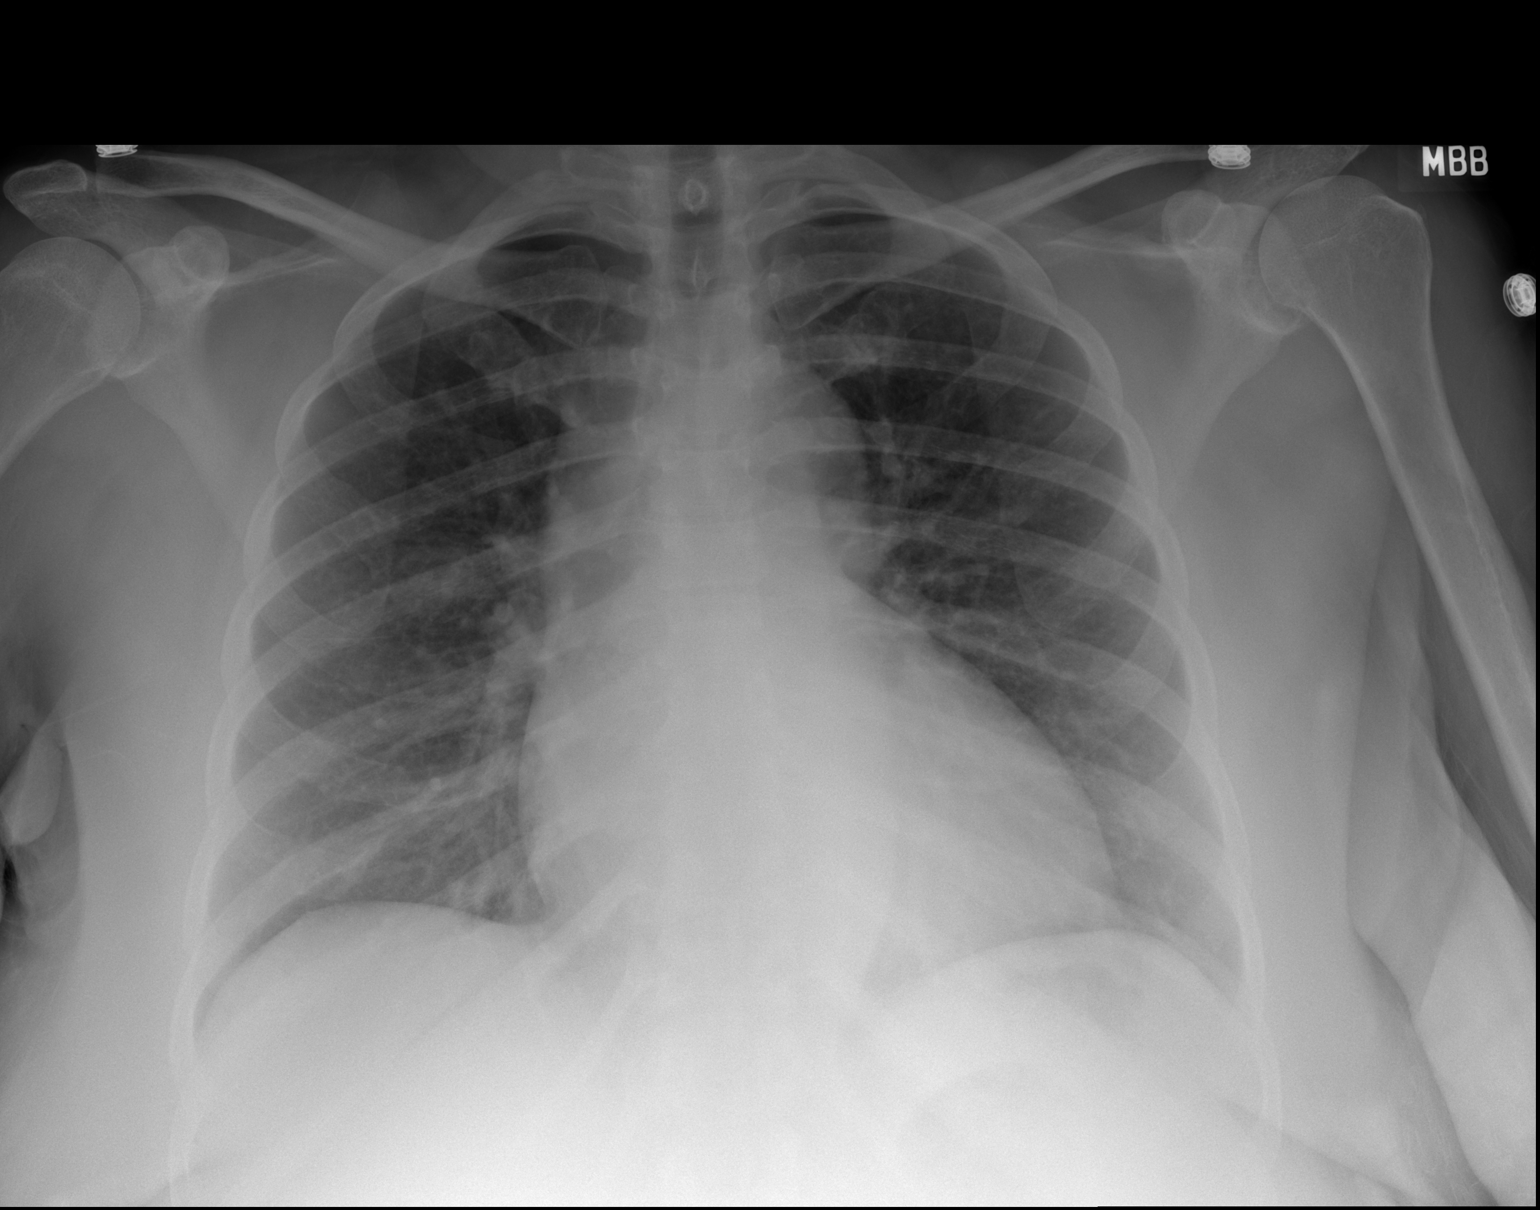

[2 of 2 positions shown; findings below may reference images not displayed]

FINDINGS: Cardiac silhouette is mildly enlarged. No mediastinal or hilar
masses. No evidence of adenopathy.

Clear lungs.  No pleural effusion or pneumothorax.

Skeletal structures are intact.
IMPRESSION: No active cardiopulmonary disease.

## 2017-12-06 IMAGING — CT CT ANGIO CHEST
2 of 8 series · 19 of 46 positions shown · IV contrast (OMNI)
Comparison: Chest x-ray from same day.

CLINICAL DATA: Right-sided chest pain.

EXAM:
CT ANGIOGRAPHY CHEST WITH CONTRAST
TECHNIQUE: Multidetector CT imaging of the chest was performed using the
standard protocol during bolus administration of intravenous
contrast. Multiplanar CT image reconstructions and MIPs were
obtained to evaluate the vascular anatomy.
CONTRAST:  100mL [9C] IOPAMIDOL ([9C]) INJECTION 76%

[Series 6: thins · axial · 0.62mm/px · z∈[+1206,+1424]mm · 16 of 240 slices shown]
[im 11/240  lung]
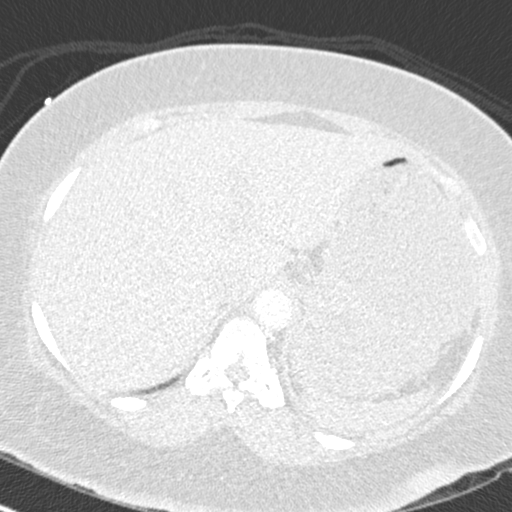
[im 22/240  soft-tissue]
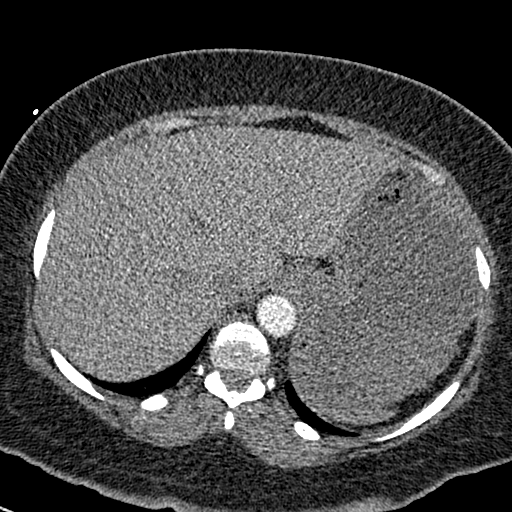
[im 44/240  lung]
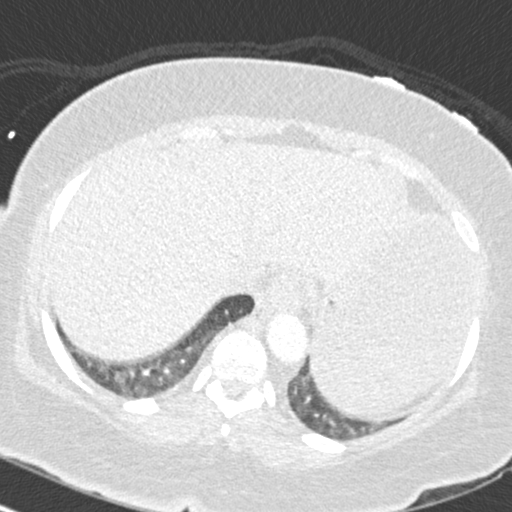
[im 55/240  soft-tissue]
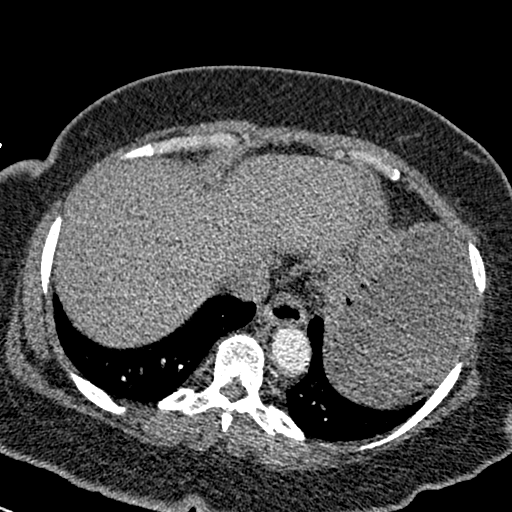
[im 66/240  lung]
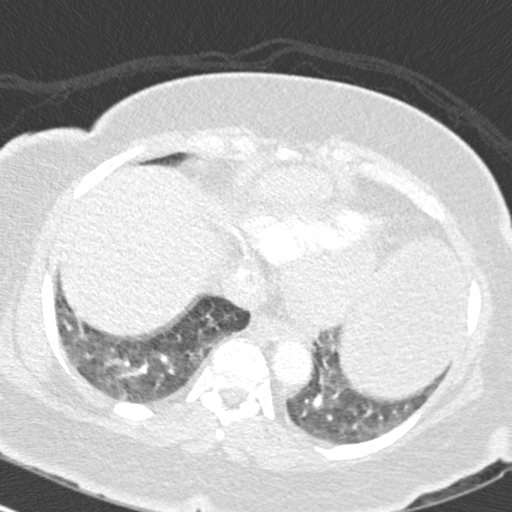
[im 87/240  soft-tissue]
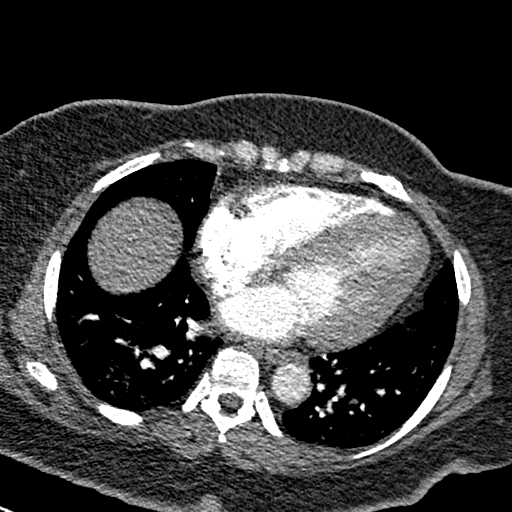
[im 98/240  lung]
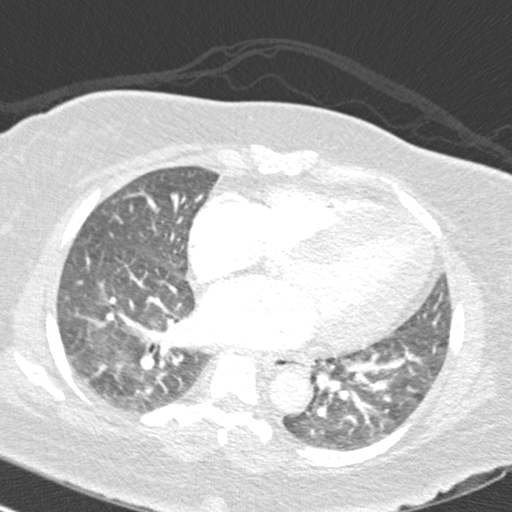
[im 109/240  soft-tissue]
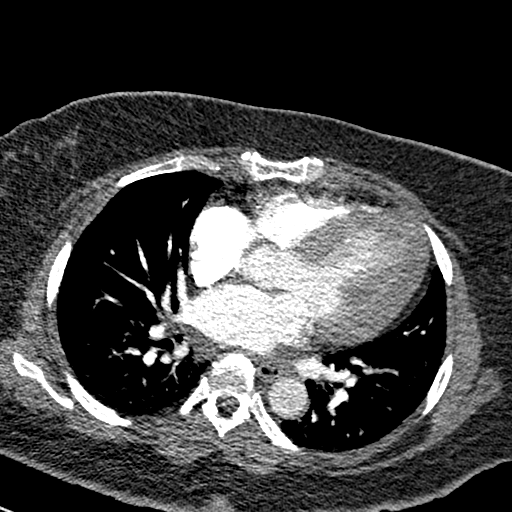
[im 131/240  lung]
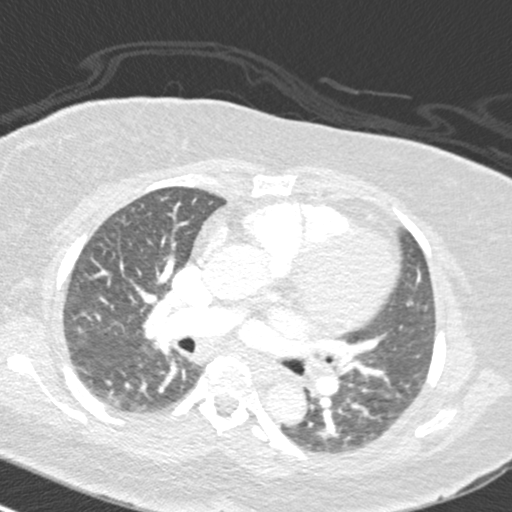
[im 142/240  soft-tissue]
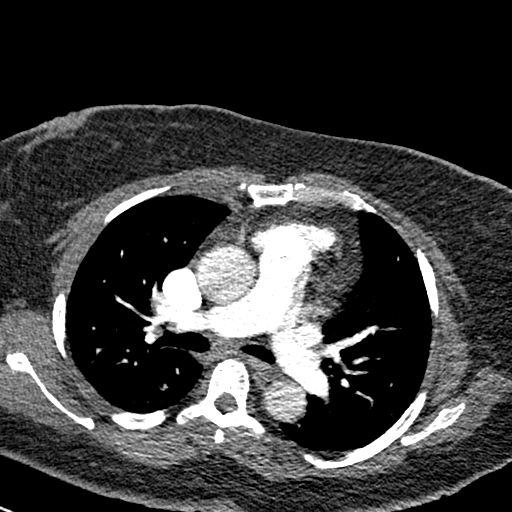
[im 153/240  lung]
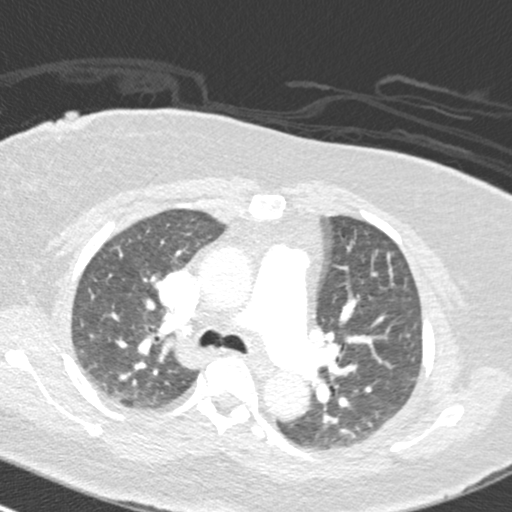
[im 174/240  soft-tissue]
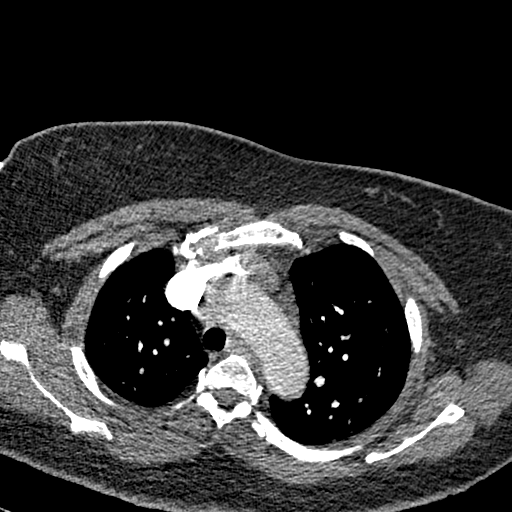
[im 185/240  lung]
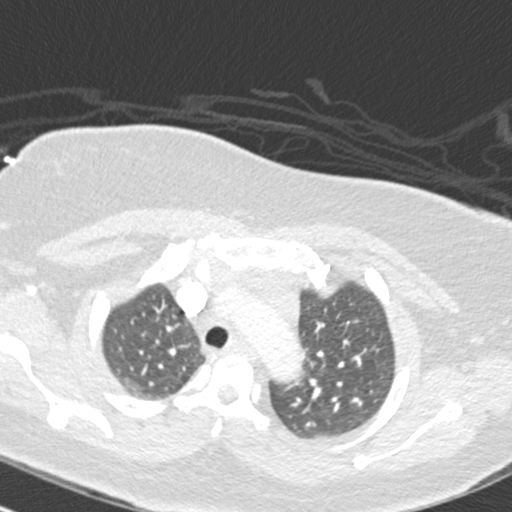
[im 196/240  soft-tissue]
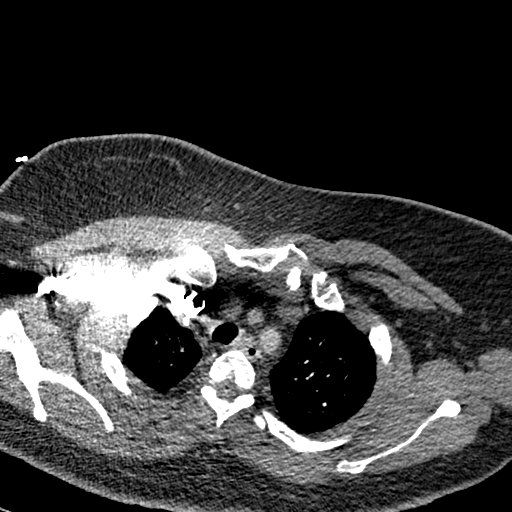
[im 218/240  lung]
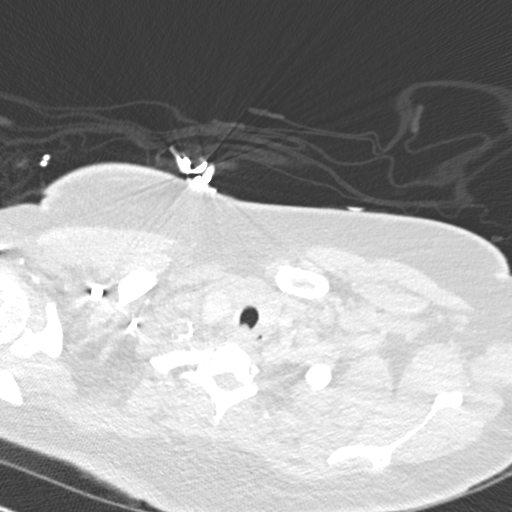
[im 229/240  soft-tissue]
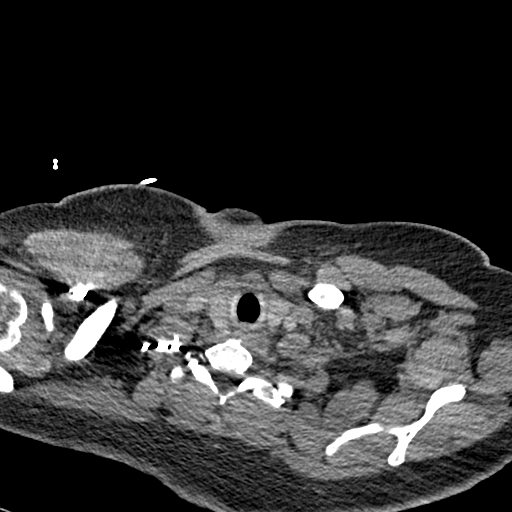

[Series 8: coronal mpr · coronal · 0.50mm/px · 3 of 151 slices shown]
[im 38/151  soft-tissue]
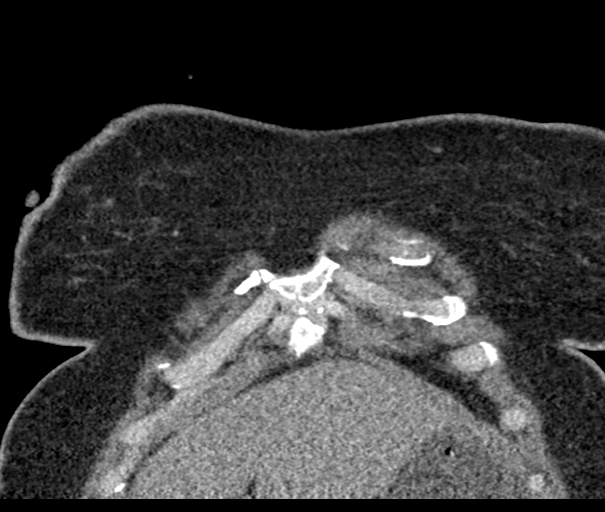
[im 76/151  soft-tissue]
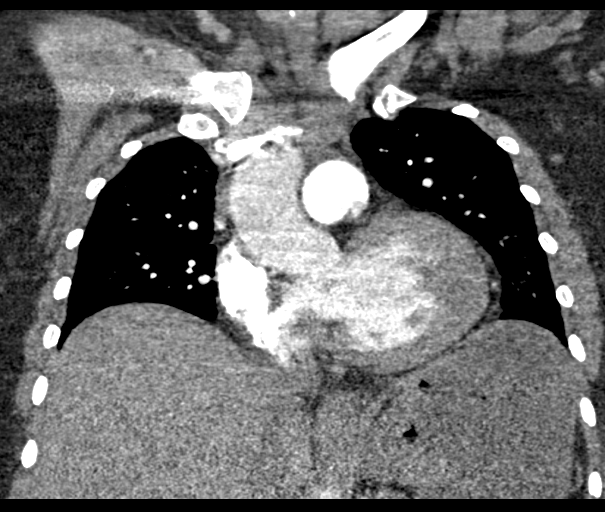
[im 113/151  soft-tissue]
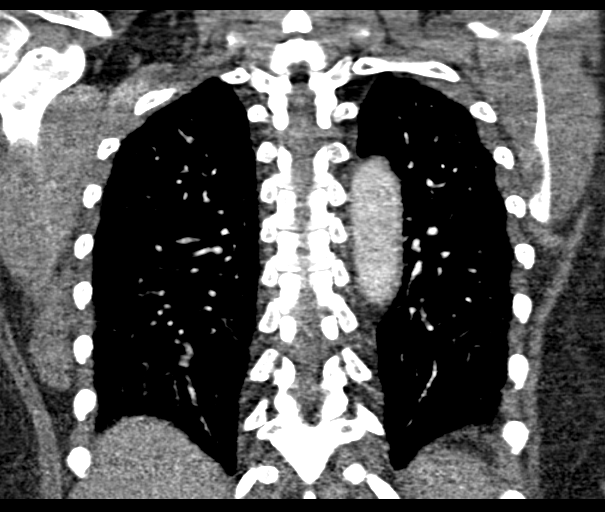

[19 of 46 positions shown; findings below may reference images not displayed]

FINDINGS: Cardiovascular: Satisfactory opacification of the pulmonary arteries
to the segmental level. No evidence of pulmonary embolism. Mild
cardiomegaly. No pericardial effusion. Normal caliber thoracic
aorta. No aortic dissection.

Mediastinum/Nodes: No enlarged mediastinal, hilar, or axillary lymph
nodes. Thyroid gland, trachea, and esophagus demonstrate no
significant findings.

Lungs/Pleura: Mild bilateral lower lobe subsegmental atelectasis. No
focal consolidation, pleural effusion, or pneumothorax. No
suspicious pulmonary nodule.

Upper Abdomen: No acute abnormality.

Musculoskeletal: No chest wall abnormality. No acute or significant
osseous findings.

Review of the MIP images confirms the above findings.
IMPRESSION: 1. No evidence of pulmonary embolism. No acute intrathoracic
process.
2. Mild cardiomegaly.

## 2017-12-06 MED ORDER — ALBUTEROL SULFATE (2.5 MG/3ML) 0.083% IN NEBU: 2.5000 mg | INHALATION_SOLUTION | Freq: Four times a day (QID) | RESPIRATORY_TRACT | Status: DC | PRN

## 2017-12-06 MED ORDER — ENOXAPARIN SODIUM 40 MG/0.4ML ~~LOC~~ SOLN
40.0000 mg | SUBCUTANEOUS | Status: DC
  Administered 2017-12-06: 40 mg via SUBCUTANEOUS
  Filled 2017-12-06 (×2): qty 0.4

## 2017-12-06 MED ORDER — ALBUTEROL SULFATE HFA 108 (90 BASE) MCG/ACT IN AERS: 1.0000 | INHALATION_SPRAY | Freq: Four times a day (QID) | RESPIRATORY_TRACT | Status: DC | PRN

## 2017-12-06 MED ORDER — IOPAMIDOL (ISOVUE-370) INJECTION 76%
INTRAVENOUS | Status: AC
  Filled 2017-12-06: qty 100

## 2017-12-06 MED ORDER — FLUCONAZOLE 200 MG PO TABS
200.0000 mg | ORAL_TABLET | Freq: Every day | ORAL | Status: DC
  Administered 2017-12-07: 200 mg via ORAL
  Filled 2017-12-06: qty 1

## 2017-12-06 MED ORDER — CLONIDINE HCL 0.1 MG PO TABS: 0.1000 mg | ORAL_TABLET | Freq: Four times a day (QID) | ORAL | Status: DC | PRN

## 2017-12-06 MED ORDER — CLONIDINE HCL 0.1 MG PO TABS: 0.1000 mg | ORAL_TABLET | Freq: Three times a day (TID) | ORAL | Status: DC

## 2017-12-06 MED ORDER — IOPAMIDOL (ISOVUE-370) INJECTION 76%
100.0000 mL | Freq: Once | INTRAVENOUS | Status: AC | PRN
  Administered 2017-12-06: 100 mL via INTRAVENOUS

## 2017-12-06 MED ORDER — ONDANSETRON HCL 4 MG/2ML IJ SOLN
4.0000 mg | Freq: Once | INTRAMUSCULAR | Status: AC
  Administered 2017-12-06: 4 mg via INTRAVENOUS
  Filled 2017-12-06: qty 2

## 2017-12-06 MED ORDER — NITROGLYCERIN 0.4 MG SL SUBL
0.4000 mg | SUBLINGUAL_TABLET | SUBLINGUAL | Status: DC | PRN
  Administered 2017-12-06 (×2): 0.4 mg via SUBLINGUAL
  Filled 2017-12-06 (×2): qty 1

## 2017-12-06 MED ORDER — INSULIN ASPART 100 UNIT/ML ~~LOC~~ SOLN
0.0000 [IU] | SUBCUTANEOUS | Status: DC
  Administered 2017-12-06 – 2017-12-07 (×2): 2 [IU] via SUBCUTANEOUS
  Administered 2017-12-07 (×2): 3 [IU] via SUBCUTANEOUS

## 2017-12-06 MED ORDER — ONDANSETRON HCL 4 MG PO TABS
4.0000 mg | ORAL_TABLET | Freq: Four times a day (QID) | ORAL | Status: DC
  Filled 2017-12-06: qty 1

## 2017-12-06 MED ORDER — AMLODIPINE BESYLATE 5 MG PO TABS
5.0000 mg | ORAL_TABLET | Freq: Every day | ORAL | Status: DC
  Administered 2017-12-07: 5 mg via ORAL
  Filled 2017-12-06: qty 1

## 2017-12-06 MED ORDER — ASPIRIN 81 MG PO CHEW
324.0000 mg | CHEWABLE_TABLET | Freq: Once | ORAL | Status: AC
Start: 2017-12-06 — End: 2017-12-06
  Administered 2017-12-06: 324 mg via ORAL
  Filled 2017-12-06: qty 4

## 2017-12-06 MED ORDER — LISINOPRIL 10 MG PO TABS
10.0000 mg | ORAL_TABLET | Freq: Every day | ORAL | Status: DC
  Administered 2017-12-07: 10 mg via ORAL
  Filled 2017-12-06: qty 1

## 2017-12-06 MED ORDER — ARIPIPRAZOLE 5 MG PO TABS
10.0000 mg | ORAL_TABLET | Freq: Every day | ORAL | Status: DC
  Administered 2017-12-07: 10 mg via ORAL
  Filled 2017-12-06: qty 2

## 2017-12-06 MED ORDER — SODIUM CHLORIDE 0.9 % IV SOLN
INTRAVENOUS | Status: DC
  Administered 2017-12-06 – 2017-12-07 (×2): via INTRAVENOUS

## 2017-12-06 MED ORDER — NITROGLYCERIN 2 % TD OINT
0.5000 [in_us] | TOPICAL_OINTMENT | Freq: Three times a day (TID) | TRANSDERMAL | Status: DC
  Administered 2017-12-06 – 2017-12-07 (×3): 0.5 [in_us] via TOPICAL
  Filled 2017-12-06: qty 30

## 2017-12-06 MED ORDER — OXYCODONE-ACETAMINOPHEN 5-325 MG PO TABS
2.0000 | ORAL_TABLET | ORAL | Status: DC | PRN
  Administered 2017-12-06: 2 via ORAL
  Filled 2017-12-06: qty 2

## 2017-12-06 MED ORDER — ZOLPIDEM TARTRATE 5 MG PO TABS: 5.0000 mg | ORAL_TABLET | Freq: Every evening | ORAL | Status: DC | PRN

## 2017-12-06 MED ORDER — INSULIN ASPART 100 UNIT/ML ~~LOC~~ SOLN
10.0000 [IU] | Freq: Once | SUBCUTANEOUS | Status: AC
  Administered 2017-12-06: 10 [IU] via INTRAVENOUS
  Filled 2017-12-06: qty 1

## 2017-12-06 MED ORDER — MORPHINE SULFATE (PF) 4 MG/ML IV SOLN
2.0000 mg | Freq: Once | INTRAVENOUS | Status: AC
  Administered 2017-12-06: 2 mg via INTRAVENOUS
  Filled 2017-12-06: qty 1

## 2017-12-06 MED ORDER — INSULIN GLARGINE 100 UNIT/ML ~~LOC~~ SOLN
30.0000 [IU] | Freq: Once | SUBCUTANEOUS | Status: AC
  Administered 2017-12-06: 30 [IU] via SUBCUTANEOUS
  Filled 2017-12-06: qty 0.3

## 2017-12-06 MED ORDER — SODIUM CHLORIDE 0.9 % IV BOLUS
1000.0000 mL | Freq: Once | INTRAVENOUS | Status: AC
Start: 2017-12-06 — End: 2017-12-06
  Administered 2017-12-06: 1000 mL via INTRAVENOUS

## 2017-12-06 NOTE — H&P (Addendum)
TRH H&P   Patient Demographics:    Kaitlyn Gray, is a 42 y.o. female  MRN: [id number]   DOB - Nov 10, 1975  Admit Date - 12/06/2017  Outpatient Primary MD for the patient is Placey, Audrea Muscat, NP  Referring MD/NP/PA: Aundria Rud  Outpatient Specialists:   Patient coming from: home  Chief Complaint  Patient presents with  . Chest Pain      HPI:    Kaitlyn Gray  is a 42 y.o. female, w dm1, hypertension, bipolar, Nicotine dep, + family hx of CAD (mother died at age 56 from MI), polysubstance abuse (coccaine, marijuana, last use 6/22) apparently was at church at 10 am and developed chest pain. The chest pain was sharp, substernal and right sided.  Pt continues to have some pain, no radiation. Pt denies fever, chills, cough, palp, sob, n/v, diarrhea, brbpr.  Pt presented to ED for evaluation.   In ED,  EKG nsr at 70 nl axis, poor R progression, no st-t changed c/w ischemia  CTA chest IMPRESSION: 1. No evidence of pulmonary embolism. No acute intrathoracic process. 2. Mild cardiomegaly.  Trop 0.00 D dimer 0.79 Na 136, K 3.4  Glucose 377 Bun <5, creatinine 0.95 Wbc 7.1, Hgb 10.1, Plt 255  Ast 15, Alt 10 Lipase 28  Pt will be admitted for chest pain w recent coccaine use yesterday      Review of systems:    In addition to the HPI above,  No Fever-chills, No Headache, No changes with Vision or hearing, No problems swallowing food or Liquids, No Cough or Shortness of Breath, No Abdominal pain, No Nausea or Vommitting, Bowel movements are regular, No Blood in stool or Urine, No dysuria, No new skin rashes or bruises, No new joints pains-aches,  No new weakness, tingling, numbness in any extremity, No recent weight gain or loss, No polyuria, polydypsia or polyphagia, No significant Mental Stressors.  A full 10 point Review of Systems was done, except  as stated above, all other Review of Systems were negative.   With Past History of the following :    Past Medical History:  Diagnosis Date  . Bipolar 1 disorder ([place])   . 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 ([place]) 07/19/2017  . Hypertension       Past Surgical History:  Procedure Laterality Date  . CESAREAN SECTION        Social History:     Social History   Tobacco Use  . Smoking status: Current Every Day Smoker    Packs/day: 0.50    Types: Cigarettes  . Smokeless tobacco: Never Used  Substance Use Topics  . Alcohol use: No     Lives - at home with 2 children  Mobility - walks by self   Family History :     Family History  Problem Relation Age of Onset  .  Hypertension Mother   . CAD Mother 67       died of MI at age 49  . Hypertension Father        Home Medications:   Prior to Admission medications   Medication Sig Start Date End Date Taking? Authorizing Provider  albuterol (PROAIR HFA) 108 (90 Base) MCG/ACT inhaler Inhale 1-2 puffs into the lungs every 6 (six) hours as needed for wheezing or shortness of breath.    [provider]  amLODipine (NORVASC) 5 MG tablet Take 1 tablet (5 mg total) by mouth daily. 07/22/17   Debbe Odea, MD  ARIPiprazole (ABILIFY) 10 MG tablet Take 10 mg by mouth daily.    [provider]  blood glucose meter kit and supplies KIT Dispense based on patient and insurance preference. Use up to four times daily as directed. (FOR ICD-9 250.00, 250.01). Please give 3 months supply of lancets and test strips to allow her to check QID. 07/22/17   Debbe Odea, MD  doxycycline (ADOXA) 100 MG tablet Take 1 tablet (100 mg total) by mouth 2 (two) times daily. 07/22/17   Debbe Odea, MD  fluconazole (DIFLUCAN) 200 MG tablet Take 1 tablet (200 mg total) by mouth daily. 11/06/17   Muthersbaugh, Jarrett Soho, PA-C  insulin aspart (NOVOLOG FLEXPEN) 100 UNIT/ML FlexPen Inject 8 Units into the skin 3  (three) times daily with meals. 07/22/17   Debbe Odea, MD  insulin aspart (NOVOLOG) 100 UNIT/ML injection Inject 0-20 Units into the skin 3 (three) times daily with meals. CBG 70 - 120: 0 units  CBG 121 - 150: 3 units  CBG 151 - 200: 4 units  CBG 201 - 250: 7 units  CBG 251 - 300: 11 units  CBG 301 - 350: 15 units  CBG 351 - 400: 20 units 07/22/17   Rizwan, Eunice Blase, MD  insulin aspart (NOVOLOG) 100 UNIT/ML injection Inject 0-5 Units into the skin at bedtime. CBG 70 - 120: 0 units  CBG 121 - 150: 0 units  CBG 151 - 200: 0 units  CBG 201 - 250: 2 units  CBG 251 - 300: 3 units  CBG 301 - 350: 4 units  CBG 351 - 400: 5 units 07/22/17   Debbe Odea, MD  Insulin Glargine (LANTUS SOLOSTAR) 100 UNIT/ML Solostar Pen Inject 45 Units into the skin every morning. 09/28/17   Charlann Lange, PA-C  lisinopril (PRINIVIL,ZESTRIL) 10 MG tablet Take 1 tablet (10 mg total) by mouth daily. 07/22/17   Debbe Odea, MD  meloxicam (MOBIC) 15 MG tablet Take 15 mg by mouth daily as needed for pain.    [provider]  metFORMIN (GLUCOPHAGE) 500 MG tablet Take 1 tablet (500 mg total) by mouth 2 (two) times daily with a meal. 07/22/17 07/22/18  Debbe Odea, MD  ondansetron (ZOFRAN) 4 MG tablet Take 1 tablet (4 mg total) by mouth every 6 (six) hours. Prn n/v. 06/20/16   Billy Fischer, MD  oxyCODONE-acetaminophen (PERCOCET/ROXICET) 5-325 MG tablet Take 2 tablets by mouth every 4 (four) hours as needed for severe pain. 07/14/16   Kinnie Feil, PA-C  promethazine (PHENERGAN) 25 MG tablet Take 25 mg by mouth every 6 (six) hours as needed for nausea or vomiting.    [provider]  zolpidem (AMBIEN) 10 MG tablet Take 10 mg by mouth at bedtime as needed for sleep.    [provider]     Allergies:     Allergies  Allergen Reactions  . Hydrocodone Itching  .  Latex Rash     Physical Exam:   Vitals  Blood pressure (!) 160/93, pulse 76, temperature 97.9 F (36.6 C), resp. rate (!) 22, SpO2 99  %.   1. General   lying in bed in NAD,   2. Normal affect and insight, Not Suicidal or Homicidal, Awake Alert, Oriented X 3.  3. No F.N deficits, ALL C.Nerves Intact, Strength 5/5 all 4 extremities, Sensation intact all 4 extremities, Plantars down going.  4. Ears and Eyes appear Normal, Conjunctivae clear, PERRLA. Moist Oral Mucosa.  5. Supple Neck, No JVD, No cervical lymphadenopathy appriciated, No Carotid Bruits.  6. Symmetrical Chest wall movement, Good air movement bilaterally, CTAB.  7. RRR, No Gallops, Rubs or Murmurs, No Parasternal Heave.  8. Positive Bowel Sounds, Abdomen Soft, No tenderness, No organomegaly appriciated,No rebound -guarding or rigidity.  9.  No Cyanosis, Normal Skin Turgor, No Skin Rash or Bruise.  10. Good muscle tone,  joints appear normal , no effusions, Normal ROM.  11. No Palpable Lymph Nodes in Neck or Axillae      Data Review:    CBC Recent Labs  Lab 12/06/17 1135  WBC 7.1  HGB 10.1*  HCT 33.9*  PLT 255  MCV 84.3  MCH 25.1*  MCHC 29.8*  RDW 15.1   ------------------------------------------------------------------------------------------------------------------  Chemistries  Recent Labs  Lab 12/06/17 1135  NA 136  K 3.4*  CL 101  CO2 27  GLUCOSE 377*  BUN <5*  CREATININE 0.95  CALCIUM 8.9  AST 15  ALT 10*  ALKPHOS 69  BILITOT 0.3   ------------------------------------------------------------------------------------------------------------------ CrCl cannot be calculated (Unknown ideal weight.). ------------------------------------------------------------------------------------------------------------------ No results for input(s): TSH, T4TOTAL, T3FREE, THYROIDAB in the last 72 hours.  Invalid input(s): FREET3  Coagulation profile No results for input(s): INR, PROTIME in the last 168 hours. ------------------------------------------------------------------------------------------------------------------- Recent  Labs    12/06/17 1135  DDIMER 0.79*   -------------------------------------------------------------------------------------------------------------------  Cardiac Enzymes No results for input(s): CKMB, TROPONINI, MYOGLOBIN in the last 168 hours.  Invalid input(s): CK ------------------------------------------------------------------------------------------------------------------ No results found for: BNP   ---------------------------------------------------------------------------------------------------------------  Urinalysis    Component Value Date/Time   COLORURINE YELLOW 11/06/2017 0218   APPEARANCEUR CLOUDY (A) 11/06/2017 0218   LABSPEC 1.027 11/06/2017 0218   PHURINE 5.0 11/06/2017 0218   GLUCOSEU >=500 (A) 11/06/2017 0218   HGBUR NEGATIVE 11/06/2017 0218   BILIRUBINUR NEGATIVE 11/06/2017 0218   KETONESUR NEGATIVE 11/06/2017 0218   PROTEINUR 30 (A) 11/06/2017 0218   UROBILINOGEN 0.2 10/06/2014 0856   NITRITE NEGATIVE 11/06/2017 0218   LEUKOCYTESUR LARGE (A) 11/06/2017 0218    ----------------------------------------------------------------------------------------------------------------   Imaging Results:    Dg Chest 2 View  Result Date: 12/06/2017 CLINICAL DATA:  Pt reports she has had right sided chest pain radiating into the right arm that started at church. Pt states she is currently homeless and is not taking any of her medications for diabetes, hypertension or bipolar disorder. EXAM: CHEST - 2 VIEW COMPARISON:  07/19/2017 FINDINGS: Cardiac silhouette is mildly enlarged. No mediastinal or hilar masses. No evidence of adenopathy. Clear lungs.  No pleural effusion or pneumothorax. Skeletal structures are intact. IMPRESSION: No active cardiopulmonary disease. Electronically Signed   By: Lajean Manes M.D.   On: 12/06/2017 12:36   Ct Angio Chest Pe W/cm &/or Wo Cm  Result Date: 12/06/2017 CLINICAL DATA:  Right-sided chest pain. EXAM: CT ANGIOGRAPHY CHEST WITH  CONTRAST TECHNIQUE: Multidetector CT imaging of the chest was performed using the standard protocol during bolus administration of intravenous contrast. Multiplanar CT  image reconstructions and MIPs were obtained to evaluate the vascular anatomy. CONTRAST:  146m ISOVUE-370 IOPAMIDOL (ISOVUE-370) INJECTION 76% COMPARISON:  Chest x-ray from same day. FINDINGS: Cardiovascular: Satisfactory opacification of the pulmonary arteries to the segmental level. No evidence of pulmonary embolism. Mild cardiomegaly. No pericardial effusion. Normal caliber thoracic aorta. No aortic dissection. Mediastinum/Nodes: No enlarged mediastinal, hilar, or axillary lymph nodes. Thyroid gland, trachea, and esophagus demonstrate no significant findings. Lungs/Pleura: Mild bilateral lower lobe subsegmental atelectasis. No focal consolidation, pleural effusion, or pneumothorax. No suspicious pulmonary nodule. Upper Abdomen: No acute abnormality. Musculoskeletal: No chest wall abnormality. No acute or significant osseous findings. Review of the MIP images confirms the above findings. IMPRESSION: 1. No evidence of pulmonary embolism. No acute intrathoracic process. 2. Mild cardiomegaly. Electronically Signed   By: WTitus DubinM.D.   On: 12/06/2017 16:12      Assessment & Plan:    Principal Problem:   Chest pain Active Problems:   Diabetes mellitus without complication (HCC)   Hypokalemia   Hypertension   Chest pain Tele Trop I q6h x3 Check hga1c, lipid Check cardiac echo  Start Lipitor 844mpo qhs Start aspirin 32574mo qday No beta blocker due to coccaine use NTP 1/2 inch tid Cardiology consult by email sent NPO after midite  Hypokalemia Replete Check cmp in am  Hypertension Start clonidine 0.1mg30m q6h prn sbp >160 Cont lisinopril 10mg45mqday Cont amlodipine 5mg p74mday  Dm1 Hold Metformin in case needs catheterization fsbs q4h, ISS Typically on Lantus, hold overnite since will be NPO  Anemia Check  cbc in am Consider further testing ferritin, iron, tibc, b12, folate, spep, upep, tsh  Polysubstance abuse/ nicotine dep counselled on cessation   Mood disorder Cont Abilify 10mg p47may  ? Screen for verneal disease requested by patient Urine chlamydia, urine gonorrhea Hiv, acute hepatitis panel   DVT Prophylaxis Lovenox - SCDs  AM Labs Ordered, also please review Full Orders  Family Communication: Admission, patients condition and plan of care including tests being ordered have been discussed with the patient  who indicate understanding and agree with the plan and Code Status.  Code Status  FULL CODE  Likely DC to  home  Condition GUARDED   Consults called:  Cardiology by email  Admission status: observation  Time spent in minutes : 60   Dayton Sherr KJani Gravel 12/06/2017 at 4:58 PM  Between 7am to 7pm - Pager - [phone] 7pm go to [email or web address] - password [email or web address]  [facility] - Office  [phone]

## 2017-12-06 NOTE — ED Notes (Signed)
Pt left for xray

## 2017-12-06 NOTE — ED Triage Notes (Addendum)
Pt exhibiting stranger behaviors at triage. Pt reports she has had right sided chest pain radiating into the right arm that started at church. Pt states she is currently homeless and is not taking any of her medications for diabetes, hypertension or bipolar disorder. Denies SI. Rates chest pain a 9/10. Unsure of last menstrual cycle. Pt states currently homeless, 2 children with her at bedside.

## 2017-12-06 NOTE — ED Provider Notes (Signed)
[facility] EMERGENCY DEPARTMENT Provider Note   CSN: [id number] Arrival date & time: 12/06/17  1127     History   Chief Complaint Chief Complaint  Patient presents with  . Chest Pain    HPI Kaitlyn Gray is a 42 y.o. female.  Patient with a history of diabetes per the controlled.  Hypertension and bipolar polysubstance abuse.  Patient states she had acute onset of left anterior chest pain while at church today at 10 AM.  Nonradiating no other symptoms no shortness of breath no nausea or vomiting.  Pain is persistent.  Patient very sleepy will follow commands but not very talkative.  Lot of history came from her female friend that was with her.     Past Medical History:  Diagnosis Date  . Bipolar 1 disorder ([place])   . 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 ([place]) 07/19/2017  . Hypertension     Patient Active Problem List   Diagnosis Date Noted  . 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 ([place]) 07/19/2017  . Bipolar disorder ([place]) 07/19/2017  . Polysubstance abuse ([place]) 07/19/2017  . Chest pain 07/19/2017  . Cocaine use disorder, severe, dependence ([place]) 03/24/2015  . Cannabis use disorder, moderate, dependence ([place]) 03/24/2015  . MDD (major depressive disorder), recurrent severe, without psychosis ([place]) 03/24/2015    Past Surgical History:  Procedure Laterality Date  . CESAREAN SECTION       OB History   None      Home Medications    Prior to Admission medications   Medication Sig Start Date End Date Taking? Authorizing Provider  albuterol (PROAIR HFA) 108 (90 Base) MCG/ACT inhaler Inhale 1-2 puffs into the lungs every 6 (six) hours as needed for wheezing or shortness of breath.    [provider]  amLODipine (NORVASC) 5 MG tablet Take 1 tablet (5 mg total) by mouth daily. 07/22/17   Debbe Odea, MD  ARIPiprazole (ABILIFY) 10 MG tablet Take 10 mg by mouth daily.     [provider]  blood glucose meter kit and supplies KIT Dispense based on patient and insurance preference. Use up to four times daily as directed. (FOR ICD-9 250.00, 250.01). Please give 3 months supply of lancets and test strips to allow her to check QID. 07/22/17   Debbe Odea, MD  doxycycline (ADOXA) 100 MG tablet Take 1 tablet (100 mg total) by mouth 2 (two) times daily. 07/22/17   Debbe Odea, MD  fluconazole (DIFLUCAN) 200 MG tablet Take 1 tablet (200 mg total) by mouth daily. 11/06/17   Muthersbaugh, Jarrett Soho, PA-C  insulin aspart (NOVOLOG FLEXPEN) 100 UNIT/ML FlexPen Inject 8 Units into the skin 3 (three) times daily with meals. 07/22/17   Debbe Odea, MD  insulin aspart (NOVOLOG) 100 UNIT/ML injection Inject 0-20 Units into the skin 3 (three) times daily with meals. CBG 70 - 120: 0 units  CBG 121 - 150: 3 units  CBG 151 - 200: 4 units  CBG 201 - 250: 7 units  CBG 251 - 300: 11 units  CBG 301 - 350: 15 units  CBG 351 - 400: 20 units 07/22/17   Rizwan, Eunice Blase, MD  insulin aspart (NOVOLOG) 100 UNIT/ML injection Inject 0-5 Units into the skin at bedtime. CBG 70 - 120: 0 units  CBG 121 - 150: 0 units  CBG 151 - 200: 0 units  CBG 201 - 250: 2 units  CBG 251 - 300: 3 units  CBG  301 - 350: 4 units  CBG 351 - 400: 5 units 07/22/17   Debbe Odea, MD  Insulin Glargine (LANTUS SOLOSTAR) 100 UNIT/ML Solostar Pen Inject 45 Units into the skin every morning. 09/28/17   Charlann Lange, PA-C  lisinopril (PRINIVIL,ZESTRIL) 10 MG tablet Take 1 tablet (10 mg total) by mouth daily. 07/22/17   Debbe Odea, MD  meloxicam (MOBIC) 15 MG tablet Take 15 mg by mouth daily as needed for pain.    [provider]  metFORMIN (GLUCOPHAGE) 500 MG tablet Take 1 tablet (500 mg total) by mouth 2 (two) times daily with a meal. 07/22/17 07/22/18  Debbe Odea, MD  ondansetron (ZOFRAN) 4 MG tablet Take 1 tablet (4 mg total) by mouth every 6 (six) hours. Prn n/v. 06/20/16   Billy Fischer, MD    oxyCODONE-acetaminophen (PERCOCET/ROXICET) 5-325 MG tablet Take 2 tablets by mouth every 4 (four) hours as needed for severe pain. 07/14/16   Kinnie Feil, PA-C  promethazine (PHENERGAN) 25 MG tablet Take 25 mg by mouth every 6 (six) hours as needed for nausea or vomiting.    [provider]  zolpidem (AMBIEN) 10 MG tablet Take 10 mg by mouth at bedtime as needed for sleep.    [provider]    Family History No family history on file.  Social History Social History   Tobacco Use  . Smoking status: Current Every Day Smoker    Packs/day: 0.50    Types: Cigarettes  . Smokeless tobacco: Never Used  Substance Use Topics  . Alcohol use: No  . Drug use: Yes    Types: Marijuana, Cocaine     Allergies   Hydrocodone and Latex   Review of Systems Review of Systems  Constitutional: Negative for fever.  HENT: Negative for congestion.   Eyes: Positive for redness.  Respiratory: Negative for shortness of breath.   Cardiovascular: Positive for chest pain.  Gastrointestinal: Negative for abdominal pain, nausea and vomiting.  Genitourinary: Negative for dysuria.  Musculoskeletal: Negative for back pain.  Skin: Negative for rash.  Neurological: Positive for dizziness.  Hematological: Does not bruise/bleed easily.  Psychiatric/Behavioral: Negative for confusion.     Physical Exam Updated Vital Signs BP (!) 160/93   Pulse 76   Temp 97.9 F (36.6 C)   Resp (!) 22   SpO2 99%   Physical Exam  Constitutional: She appears well-developed and well-nourished. No distress.  HENT:  Head: Normocephalic and atraumatic.  Mouth/Throat: Oropharynx is clear and moist.  Eyes: Pupils are equal, round, and reactive to light. Conjunctivae and EOM are normal.  Neck: Neck supple.  Cardiovascular: Normal rate, regular rhythm and normal heart sounds.  Pulmonary/Chest: Effort normal and breath sounds normal. No respiratory distress. She exhibits no tenderness.  Abdominal:  Soft. Bowel sounds are normal. There is no tenderness.  Musculoskeletal: Normal range of motion.  Neurological: She is alert. No cranial nerve deficit or sensory deficit. She exhibits normal muscle tone. Coordination normal.  Skin: Skin is warm.  Nursing note and vitals reviewed.    ED Treatments / Results  Labs (all labs ordered are listed, but only abnormal results are displayed) Labs Reviewed  BASIC METABOLIC PANEL - Abnormal; Notable for the following components:      Result Value   Potassium 3.4 (*)    Glucose, Bld 377 (*)    BUN <5 (*)    All other components within normal limits  CBC - Abnormal; Notable for the following components:   Hemoglobin 10.1 (*)  HCT 33.9 (*)    MCH 25.1 (*)    MCHC 29.8 (*)    All other components within normal limits  HEPATIC FUNCTION PANEL - Abnormal; Notable for the following components:   Total Protein 6.3 (*)    Albumin 3.2 (*)    ALT 10 (*)    Bilirubin, Direct <0.1 (*)    All other components within normal limits  D-DIMER, QUANTITATIVE (NOT AT [facility]) - Abnormal; Notable for the following components:   D-Dimer, Quant 0.79 (*)    All other components within normal limits  CBG MONITORING, ED - Abnormal; Notable for the following components:   Glucose-Capillary 322 (*)    All other components within normal limits  LIPASE, BLOOD  I-STAT TROPONIN, ED  I-STAT BETA HCG BLOOD, ED ([facility], [facility], [facility] ONLY)  CBG MONITORING, ED  I-STAT TROPONIN, ED    EKG EKG Interpretation  Date/Time:  Sunday December 06 2017 11:32:20 EDT Ventricular Rate:  71 PR Interval:  144 QRS Duration: 100 QT Interval:  416 QTC Calculation: 452 R Axis:   20 Text Interpretation:  Normal sinus rhythm Cannot rule out Anterior infarct , age undetermined Abnormal ECG Interpretation limited secondary to artifact Confirmed by Fredia Sorrow [phone]) on 12/06/2017 12:22:12 PM   Radiology Dg Chest 2 View  Result Date: 12/06/2017 CLINICAL DATA:  Pt reports she has had right  sided chest pain radiating into the right arm that started at church. Pt states she is currently homeless and is not taking any of her medications for diabetes, hypertension or bipolar disorder. EXAM: CHEST - 2 VIEW COMPARISON:  07/19/2017 FINDINGS: Cardiac silhouette is mildly enlarged. No mediastinal or hilar masses. No evidence of adenopathy. Clear lungs.  No pleural effusion or pneumothorax. Skeletal structures are intact. IMPRESSION: No active cardiopulmonary disease. Electronically Signed   By: Lajean Manes M.D.   On: 12/06/2017 12:36   Ct Angio Chest Pe W/cm &/or Wo Cm  Result Date: 12/06/2017 CLINICAL DATA:  Right-sided chest pain. EXAM: CT ANGIOGRAPHY CHEST WITH CONTRAST TECHNIQUE: Multidetector CT imaging of the chest was performed using the standard protocol during bolus administration of intravenous contrast. Multiplanar CT image reconstructions and MIPs were obtained to evaluate the vascular anatomy. CONTRAST:  115m ISOVUE-370 IOPAMIDOL (ISOVUE-370) INJECTION 76% COMPARISON:  Chest x-ray from same day. FINDINGS: Cardiovascular: Satisfactory opacification of the pulmonary arteries to the segmental level. No evidence of pulmonary embolism. Mild cardiomegaly. No pericardial effusion. Normal caliber thoracic aorta. No aortic dissection. Mediastinum/Nodes: No enlarged mediastinal, hilar, or axillary lymph nodes. Thyroid gland, trachea, and esophagus demonstrate no significant findings. Lungs/Pleura: Mild bilateral lower lobe subsegmental atelectasis. No focal consolidation, pleural effusion, or pneumothorax. No suspicious pulmonary nodule. Upper Abdomen: No acute abnormality. Musculoskeletal: No chest wall abnormality. No acute or significant osseous findings. Review of the MIP images confirms the above findings. IMPRESSION: 1. No evidence of pulmonary embolism. No acute intrathoracic process. 2. Mild cardiomegaly. Electronically Signed   By: WTitus DubinM.D.   On: 12/06/2017 16:12     Procedures Procedures (including critical care time)  Medications Ordered in ED Medications  0.9 %  sodium chloride infusion ( Intravenous IV Pump Association 12/06/17 1421)  nitroGLYCERIN (NITROSTAT) SL tablet 0.4 mg (has no administration in time range)  iopamidol (ISOVUE-370) 76 % injection (has no administration in time range)  insulin aspart (novoLOG) injection 10 Units (has no administration in time range)  sodium chloride 0.9 % bolus 1,000 mL (0 mLs Intravenous Stopped 12/06/17 1447)  aspirin chewable tablet 324  mg (324 mg Oral Given 12/06/17 1408)  insulin aspart (novoLOG) injection 10 Units (10 Units Intravenous Given 12/06/17 1413)  iopamidol (ISOVUE-370) 76 % injection 100 mL (100 mLs Intravenous Contrast Given 12/06/17 1524)     Initial Impression / Assessment and Plan / ED Course  I have reviewed the triage vital signs and the nursing notes.  Pertinent labs & imaging results that were available during my care of the patient were reviewed by me and considered in my medical decision making (see chart for details).    Patient's blood sugar here elevated.  Patient given 10 units of insulin which brought blood sugar only down into the 300s.  Give another 10 units.  Patient is supposed to be on insulin sliding scale at home as well as oral hypoglycemics.  Appears blood sugars are difficult to control.  Frequent ED visits for hyperglycemia without admissions patient showing no evidence of DKA here today.  For the chest pain left anterior chest pain I ordered aspirin and nitroglycerin apparently nurse only gave aspirin and pain improved significantly on was went entirely away.  Patient given 1 dose of morphine pain completely resolved.  Initial troponin negative chest x-ray without acute findings.  However patient d-dimer was elevated and she is at risk for pulmonary embolus as well so CT angios was done but negative for PE.  EKG without any acute findings however anterior infarct age  undetermined was noted.   Due to patient's polysubstance abuse urine drug screen is ordered.  Not back yet.   Discussed with hospitalist they will admit for the chest pain.    Final Clinical Impressions(s) / ED Diagnoses   Final diagnoses:  Precordial pain  Hyperglycemia  Essential hypertension    ED Discharge Orders    None       Fredia Sorrow, MD 12/06/17 1751

## 2017-12-07 ENCOUNTER — Observation Stay (HOSPITAL_BASED_OUTPATIENT_CLINIC_OR_DEPARTMENT_OTHER): Payer: Medicaid Other

## 2017-12-07 ENCOUNTER — Telehealth: Payer: Self-pay

## 2017-12-07 DIAGNOSIS — R072 Precordial pain: Secondary | ICD-10-CM

## 2017-12-07 DIAGNOSIS — I10 Essential (primary) hypertension: Secondary | ICD-10-CM

## 2017-12-07 LAB — CBC
HCT: 29.7 % — ABNORMAL LOW (ref 36.0–46.0)
Hemoglobin: 9 g/dL — ABNORMAL LOW (ref 12.0–15.0)
MCH: 25.6 pg — ABNORMAL LOW (ref 26.0–34.0)
MCHC: 30.3 g/dL (ref 30.0–36.0)
MCV: 84.6 fL (ref 78.0–100.0)
PLATELETS: 219 10*3/uL (ref 150–400)
RBC: 3.51 MIL/uL — ABNORMAL LOW (ref 3.87–5.11)
RDW: 15.2 % (ref 11.5–15.5)
WBC: 6.6 10*3/uL (ref 4.0–10.5)

## 2017-12-07 LAB — COMPREHENSIVE METABOLIC PANEL
ALBUMIN: 2.8 g/dL — AB (ref 3.5–5.0)
ALT: 8 U/L — ABNORMAL LOW (ref 14–54)
AST: 12 U/L — AB (ref 15–41)
Alkaline Phosphatase: 63 U/L (ref 38–126)
Anion gap: 7 (ref 5–15)
BUN: 5 mg/dL — ABNORMAL LOW (ref 6–20)
CALCIUM: 8.2 mg/dL — AB (ref 8.9–10.3)
CO2: 26 mmol/L (ref 22–32)
CREATININE: 0.86 mg/dL (ref 0.44–1.00)
Chloride: 104 mmol/L (ref 101–111)
Glucose, Bld: 210 mg/dL — ABNORMAL HIGH (ref 65–99)
Potassium: 3.4 mmol/L — ABNORMAL LOW (ref 3.5–5.1)
Sodium: 137 mmol/L (ref 135–145)
Total Bilirubin: 0.5 mg/dL (ref 0.3–1.2)
Total Protein: 5.6 g/dL — ABNORMAL LOW (ref 6.5–8.1)

## 2017-12-07 LAB — LIPID PANEL
CHOL/HDL RATIO: 3.8 ratio
CHOLESTEROL: 164 mg/dL (ref 0–200)
HDL: 43 mg/dL (ref >40)
LDL Cholesterol: 101 mg/dL — ABNORMAL HIGH (ref 0–99)
TRIGLYCERIDES: 98 mg/dL (ref <150)
VLDL: 20 mg/dL (ref 0–40)

## 2017-12-07 LAB — GLUCOSE, CAPILLARY
GLUCOSE-CAPILLARY: 210 mg/dL — AB (ref 65–99)
GLUCOSE-CAPILLARY: 235 mg/dL — AB (ref 65–99)
Glucose-Capillary: 157 mg/dL — ABNORMAL HIGH (ref 65–99)
Glucose-Capillary: 255 mg/dL — ABNORMAL HIGH (ref 65–99)

## 2017-12-07 LAB — ECHOCARDIOGRAM COMPLETE
HEIGHTINCHES: 64 in
Weight: 3222.4 oz

## 2017-12-07 LAB — HEMOGLOBIN A1C
Hgb A1c MFr Bld: 11.6 % — ABNORMAL HIGH (ref 4.8–5.6)
MEAN PLASMA GLUCOSE: 286.22 mg/dL

## 2017-12-07 LAB — HIV ANTIBODY (ROUTINE TESTING W REFLEX): HIV SCREEN 4TH GENERATION: NONREACTIVE

## 2017-12-07 LAB — TROPONIN I: Troponin I: <0.03 ng/mL (ref <0.03)

## 2017-12-07 MED ORDER — INSULIN LISPRO 100 UNIT/ML (KWIKPEN): 0.0000 [IU] | PEN_INJECTOR | Freq: Three times a day (TID) | SUBCUTANEOUS | 11 refills | Status: DC

## 2017-12-07 MED ORDER — INSULIN GLARGINE 100 UNIT/ML SOLOSTAR PEN: 45.0000 [IU] | PEN_INJECTOR | Freq: Every morning | SUBCUTANEOUS | 1 refills | Status: DC

## 2017-12-07 MED ORDER — BLOOD GLUCOSE METER KIT: PACK | 3 refills | Status: DC

## 2017-12-07 MED ORDER — INSULIN LISPRO 100 UNIT/ML (KWIKPEN): 0.0000 [IU] | PEN_INJECTOR | Freq: Every day | SUBCUTANEOUS | 3 refills | Status: DC

## 2017-12-07 MED ORDER — METFORMIN HCL 1000 MG PO TABS: 1000.0000 mg | ORAL_TABLET | Freq: Two times a day (BID) | ORAL | 2 refills | Status: DC

## 2017-12-07 MED ORDER — INSULIN PEN NEEDLE 31G X 5 MM MISC: 2 refills | Status: DC

## 2017-12-07 MED ORDER — INSULIN LISPRO 100 UNIT/ML (KWIKPEN): 8.0000 [IU] | PEN_INJECTOR | Freq: Three times a day (TID) | SUBCUTANEOUS | 3 refills | Status: DC

## 2017-12-07 MED ORDER — INSULIN ASPART 100 UNIT/ML FLEXPEN: 8.0000 [IU] | PEN_INJECTOR | Freq: Three times a day (TID) | SUBCUTANEOUS | 11 refills | Status: DC

## 2017-12-07 MED ORDER — AMLODIPINE BESYLATE 10 MG PO TABS: 10.0000 mg | ORAL_TABLET | Freq: Every day | ORAL | 2 refills | Status: DC

## 2017-12-07 MED ORDER — INSULIN ASPART 100 UNIT/ML ~~LOC~~ SOLN: 0.0000 [IU] | Freq: Every day | SUBCUTANEOUS | 11 refills | Status: DC

## 2017-12-07 MED ORDER — ALBUTEROL SULFATE HFA 108 (90 BASE) MCG/ACT IN AERS: 2.0000 | INHALATION_SPRAY | Freq: Four times a day (QID) | RESPIRATORY_TRACT | 1 refills | Status: DC | PRN

## 2017-12-07 MED ORDER — RANITIDINE HCL 300 MG PO TABS: 300.0000 mg | ORAL_TABLET | Freq: Every day | ORAL | 1 refills | Status: DC

## 2017-12-07 MED ORDER — BLOOD GLUCOSE MONITOR KIT: PACK | 0 refills | Status: DC

## 2017-12-07 MED ORDER — INSULIN ASPART 100 UNIT/ML ~~LOC~~ SOLN: 0.0000 [IU] | Freq: Three times a day (TID) | SUBCUTANEOUS | 11 refills | Status: DC

## 2017-12-07 MED ORDER — ONDANSETRON HCL 4 MG PO TABS: 4.0000 mg | ORAL_TABLET | Freq: Four times a day (QID) | ORAL | 1 refills | Status: DC

## 2017-12-07 MED ORDER — ISOSORBIDE MONONITRATE ER 30 MG PO TB24: 30.0000 mg | ORAL_TABLET | Freq: Every day | ORAL | 2 refills | Status: DC

## 2017-12-07 MED ORDER — ARIPIPRAZOLE 10 MG PO TABS: 10.0000 mg | ORAL_TABLET | Freq: Every day | ORAL | 2 refills | Status: DC

## 2017-12-07 NOTE — Progress Notes (Signed)
VS stable. No acute distress noted. Bed locked and low. Call bell within reach.

## 2017-12-07 NOTE — Clinical Social Work Note (Signed)
Clinical Social Work Assessment  Patient Details  Name: Kaitlyn Gray MRN: [id number] Date of Birth: [birth year]  Date of referral:  12/07/17               Reason for consult:  Discharge Planning, [employer], Housing Concerns/Homelessness, Substance Use/ETOH Abuse                Permission sought to share information with:  Other(shelters) Permission granted to share information::     Name::        Agency::     Relationship::     Contact Information:     Housing/Transportation Living arrangements for the past 2 months:  Single Family Home, No permanent address, Homeless Source of Information:  Patient Patient Interpreter Needed:  None Criminal Activity/Legal Involvement Pertinent to Current Situation/Hospitalization:  No - Comment as needed Significant Relationships:  Dependent Children, Other Family Members, Friend Lives with:  Friends Do you feel safe going back to the place where you live?  Yes Need for family participation in patient care:  No (Coment)  Care giving concerns: Patient and her two minor children are homeless. Patient also tested positive for cocaine on admission.  Social Worker assessment / plan: CSW met with patient and children at bedside. Two of patient's friends also present. Patient consented for friends to remain in room for assessment. Patient reported she and her children have been staying at patient's friend's house. Patient stated she did not want to return to her friend's house. Patient is not employed and does not have any income. Patient is also uninsured.   CSW did call area family shelters: [employer] did not answer or return call, patient reported she has checked with them in the past and they do not serve families at this time; [facility] [employer]) has a Management consultant; [facility] did not answer or return call, CSW left a voicemail; [employer] did not answer or return call, CSW left a message. CSW advised patient to call [facility] and place  herself and family on waiting list. CSW also provided contact information for the shelters for patient to continue following up. Shelter contact information also provided on patient's AVS.  CSW did not assess patient's substance use as her young children were present at bedside. CSW provided list of substance use treatment resources for RN to include with AVS.  Later in the afternoon, patient reported she and her children would return to her friend's house after all. CSW did inform patient CSW would make a referral to department of social services.   CSW called [facility] and made report given patient's social situation and substance use. CSW did not have cause to believe children to be in immediate physical danger. CPS to follow up with letter to CSW on whether they have accepted the report in seven days.  CSW signing off, as patient discharged today and resources provided.  Employment status:  Unemployed Forensic scientist:  Self Pay (Medicaid Pending) PT Recommendations:  Not assessed at this time Information / Referral to community resources:  Shelter, CPS (Comment Required: [place], Name & Number of worker spoken with)  Patient/Family's Response to care: Patient lethargic, irritable, though appreciative of efforts to locate shelter.  Patient/Family's Understanding of and Emotional Response to Diagnosis, Current Treatment, and Prognosis: Unable to determine if patient understanding of her medical conditions. Patient and her children will return to her friend's house.  Emotional Assessment Appearance:  Appears stated age Attitude/Demeanor/Rapport:  Engaged, Lethargic Affect (typically observed):  Irritable, Guarded Orientation:  Oriented to Self, Oriented to Place, Oriented to  Time, Oriented to Situation Alcohol / Substance use:  Not Applicable Psych involvement (Current and /or in the community):  No (Comment)  Discharge Needs  Concerns to be  addressed:  Discharge Planning Concerns, Substance Abuse Concerns, Homelessness Readmission within the last 30 days:  No Current discharge risk:  Substance Abuse, Homeless Barriers to Discharge:  Homeless with medical needs   Estanislado Emms, LCSW 12/07/2017, 2:54 PM

## 2017-12-07 NOTE — Care Management Note (Addendum)
Case Management Note  Patient Details  Name: Kaitlyn Gray MRN: [id number] Date of Birth: 12/07/1975  Subjective/Objective:  Pt presented for Chest Pain- Plan to transition out of Hospital today. Pt has 2 children in the room- pt states she has no where to go when she leaves the hospital. PTA she was staying with a friend and states she can not go back. CM assisting with medications- Pt usually uses the [facility]-Mary Jacqulyn Bath NP is PCP- office closed until Wed. CM did call Erskine Squibb with [facility] to see if patient could get an appointment at the [facility] in order to utilize the Pharmacy at the [facility].                     Action/Plan: CM did speak with CSW in regards to assist with Shelter needs for patient and children. CM will continue to monitor.    Expected Discharge Date:  12/07/17               Expected Discharge Plan:  Homeless Shelter  In-House Referral:  Clinical Social Work  Discharge planning Services  CM Consult, Indigent [facility] Clinic, Medication Assistance, Follow Up Appointment Scheduled, MATCH Program  Post Acute Care Choice:  NA Choice offered to:  NA  DME Arranged:  N/A DME Agency:  NA  HH Arranged:  NA HH Agency:  NA  Status of Service:  Completed, signed off  If discussed at [employer], dates discussed:    Additional Comments: 1447 12-07-17 Tomi Bamberger, RN,BSN [phone] MATCH Completed. Letter provided to patient. Raynelle Fanning CM will override for co pays. Patient aware that she can only utilize 1 x a year. No further needs from CM at this time.     1312 12-07-17 Tomi Bamberger, RN,BSN [phone] Pt is [facility] for [facility]- Appointment to be placed on AVS. The clinic can assist with transportation to appointments.  Working with Erskine Squibb in regards to medications. CM called the Diabetes Coordinator to see if insulins could be more streamlined for patient. CM will try to get True  Metrics Meter from [facility]. Checking on additional cost for medications.  . [facility] Number for Diabetics.  . Rx for True Metrix glucose meter -[id number] . Lancets  28 gauge 112059 $2.00 . Strips [id number] . Syringes True Plus syringes 100 in a box.  . Meter is free and 50 strips for $10.00 or 100 strips for $20.00  . Solostar flex pen Lantus samples available.  . Humalog flex pen samples available.  . True plus pen needles [id number]      Gala Lewandowsky, RN 12/07/2017, 12:50 PM

## 2017-12-07 NOTE — Discharge Instructions (Signed)
1) try to get help with drug rehab specifically help to help you quit cocaine use 2) you are advised to quit smoking tobacco, marijuana and cocaine 3) take your blood pressure medications as advised to prevent increased risk for heart attack, vision loss, stroke, heart failure and kidney failure 4) low-salt diet advised 5) follow-up with your primary care doctor for reevaluation of your diabetes and blood pressure

## 2017-12-07 NOTE — Progress Notes (Signed)
*   Echocardiogram 2D Echocardiogram has been performed.  Badr Piedra T Arden Tinoco 12/07/2017, 11:57 AM

## 2017-12-07 NOTE — Progress Notes (Addendum)
Inpatient Diabetes Program Recommendations  AACE/ADA: New Consensus Statement on Inpatient Glycemic Control (2019)  Target Ranges:  Prepandial:   less than 140 mg/dL      Peak postprandial:   less than 180 mg/dL (1-2 hours)      Critically ill patients:  140 - 180 mg/dL   Results for Kaitlyn Gray, Kaitlyn Gray (MRN [id number]) as of 12/07/2017 11:15  Ref. Range 12/06/2017 15:05 12/06/2017 20:07 12/07/2017 00:19 12/07/2017 04:21 12/07/2017 08:10  Glucose-Capillary Latest Ref Range: 65 - 99 mg/dL 409 (H) 811 (H) 914 (H) 210 (H) 157 (H)   Review of Glycemic Control  Diabetes history: DM2 Outpatient Diabetes medications: Lantus 45 units QAM, Novolog 8 units TID with meals, Novolog 0-20 units TID with meals, Novolog 0-5 units QHS Current orders for Inpatient glycemic control: Novolog 0-9 units Q4H  Inpatient Diabetes Program Recommendations: Insulin - Basal: Patient received one time dose of Lantus 30 units last night but no other Lantus ordered at this time. Please consider ordering Lantus 33 units QHS. Correction (SSI): Please consider changing frequency of CBGs and Novolog to Novolog 0-9 units TID with meals and Novolog 0-5 units QHS. Insulin - Meal Coverage: Please consider ordering Novolog 5 units TID with meals for meal coverage if patient eats at least 50% of meals. A1C: A1C 11.6% on 12/07/17 indicating an average glucose of 286 mg/dl over the past 2-3 months.  Addendum 12/07/17@15 :00-Spoke with patient about diabetes and home regimen for diabetes control. Patient reports that she does not have insurance nor money to buy any medications. Patient reports that she was living with a friend which she can not go back to and she states that she is unsure where her, her children, and her boyfriend will go when they are discharged. Patient states that once she was discharged from the hospital in February 2019 that she was discharged on Lantus and Novolog insulin (pens). Patient states that she was taking Lantus 45  units daily and Novolog 8 units TID with meals when she had the insulin.  Patient states that she was checking glucose 2 times per day when she had a glucometer (has lost it and not checked glucose in several weeks) her glucose was in the 200-300's mg/dl despite taking the Lantus and Novolog as prescribed. Patient reports that she does not have a glucometer, testing supplies, insulin, nor insulin pen needles. Discussed A1C results (11.6% on 12/07/17) and explained that her current A1C indicates an average glucose of 286 mg/dl over the past 2-3 months.  Discussed glucose and A1C goals. Discussed importance of checking CBGs and maintaining good CBG control to prevent long-term and short-term complications. Explained how hyperglycemia leads to damage within blood vessels which lead to the common complications seen with uncontrolled diabetes. Stressed to the patient the importance of improving glycemic control to prevent further complications from uncontrolled diabetes. Discussed impact of nutrition, exercise, stress, sickness, and medications on diabetes control. Discussed discharge plan to go to the [facility] to get discharge prescriptions filled and that Case Manager is working on trying to get follow up appointment and [facility] voucher. Stressed to patient that she was young and that if she does not get DM under control she is at increased risk of development DM complications that were discussed in detail.  Encouraged patient to check her glucose 3-4 times per day, to keep a log book of glucose readings and insulin taken, and be sure to follow up with doctor appointments.   Patient verbalized understanding of information  discussed and she states that she has no further questions at this time related to diabetes.  Thanks, Orlando Penner, RN, MSN, CDE Diabetes Coordinator Inpatient Diabetes Program [phone] (Team Pager from 8am to 5pm)

## 2017-12-07 NOTE — Consult Note (Addendum)
Cardiology Consultation:   Patient ID: Mackynzie Woolford; [id number]; January 16, 1976   Admit date: 12/06/2017 Date of Consult: 12/07/2017  Primary Care Provider: Marliss Coots, NP Primary Cardiologist: New-Dr. Kirk Ruths, MD  Patient Profile:   Teshia Mahone is a 42 y.o. female with a hx of uncontrolled DM2, hypertension, bipolar disorder, tobacco and polysubstance abuse and family history of premature CAD (mother died at age 24 years old from [place]) who is being seen today for the evaluation of chest pain at the request of Dr. Maudie Mercury.  History of Present Illness:   Ms. Shortridge is an unfortunate 42 year old female with a history stated above who presented to [facility] on 12/06/2017 with complaints of right-sided chest pain rated a 10/10 in severity with no radiation which began on 12/06/2017 at approximately 10 AM while sitting at church. She denies prior episode of chest pain however, reports being under tremendous amount of stress lately in relation to being homeless with her two children ages 73 and 87 yo.  She has not taken her medications in approximately 3 months secondary to no income or insurance. She denies palpitations, LE swelling, SOB, orthopnea, recent fevers, chills, cough, dizziness or syncope. She reports ongoing tobacco use and occasional cocaine use.  UDS positive for cocaine and THC on admission.  She has a strong family history of CAD in her mother who died at age 78 of an MI.  She states that her father had an MI as well however still living.  In the ED, EKG revealed NSR with HR 70 bpm and no acute ischemic ST-T wave changes.  Initial i-STAT troponin negative at 0.00 with subsequent negative readings at <0.03, <0.03.  CXR with no active acute cardiopulmonary disease.  CTA performed 12/06/2017 with no evidence of PE, mild cardiomegaly with no pericardial effusion and no aortic dissection.  Cardiology has been consulted given her presenting symptoms and risk factors.  Past Medical  History:  Diagnosis Date  . Bipolar 1 disorder ([place])   . 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 ([place]) 07/19/2017  . Hypertension     Past Surgical History:  Procedure Laterality Date  . CESAREAN SECTION       Prior to Admission medications   Medication Sig Start Date End Date Taking? Authorizing Provider  albuterol (PROAIR HFA) 108 (90 Base) MCG/ACT inhaler Inhale 1-2 puffs into the lungs every 6 (six) hours as needed for wheezing or shortness of breath.    [provider]  amLODipine (NORVASC) 5 MG tablet Take 1 tablet (5 mg total) by mouth daily. 07/22/17   Debbe Odea, MD  ARIPiprazole (ABILIFY) 10 MG tablet Take 10 mg by mouth daily.    [provider]  blood glucose meter kit and supplies KIT Dispense based on patient and insurance preference. Use up to four times daily as directed. (FOR ICD-9 250.00, 250.01). Please give 3 months supply of lancets and test strips to allow her to check QID. 07/22/17   Debbe Odea, MD  doxycycline (ADOXA) 100 MG tablet Take 1 tablet (100 mg total) by mouth 2 (two) times daily. 07/22/17   Debbe Odea, MD  fluconazole (DIFLUCAN) 200 MG tablet Take 1 tablet (200 mg total) by mouth daily. 11/06/17   Muthersbaugh, Jarrett Soho, PA-C  insulin aspart (NOVOLOG FLEXPEN) 100 UNIT/ML FlexPen Inject 8 Units into the skin 3 (three) times daily with meals. 07/22/17   Debbe Odea, MD  insulin aspart (NOVOLOG) 100 UNIT/ML injection Inject 0-20 Units into the skin  3 (three) times daily with meals. CBG 70 - 120: 0 units  CBG 121 - 150: 3 units  CBG 151 - 200: 4 units  CBG 201 - 250: 7 units  CBG 251 - 300: 11 units  CBG 301 - 350: 15 units  CBG 351 - 400: 20 units 07/22/17   Rizwan, Eunice Blase, MD  insulin aspart (NOVOLOG) 100 UNIT/ML injection Inject 0-5 Units into the skin at bedtime. CBG 70 - 120: 0 units  CBG 121 - 150: 0 units  CBG 151 - 200: 0 units  CBG 201 - 250: 2 units  CBG 251 - 300: 3 units  CBG 301 - 350: 4  units  CBG 351 - 400: 5 units 07/22/17   Debbe Odea, MD  Insulin Glargine (LANTUS SOLOSTAR) 100 UNIT/ML Solostar Pen Inject 45 Units into the skin every morning. 09/28/17   Charlann Lange, PA-C  lisinopril (PRINIVIL,ZESTRIL) 10 MG tablet Take 1 tablet (10 mg total) by mouth daily. 07/22/17   Debbe Odea, MD  meloxicam (MOBIC) 15 MG tablet Take 15 mg by mouth daily as needed for pain.    [provider]  metFORMIN (GLUCOPHAGE) 500 MG tablet Take 1 tablet (500 mg total) by mouth 2 (two) times daily with a meal. 07/22/17 07/22/18  Debbe Odea, MD  ondansetron (ZOFRAN) 4 MG tablet Take 1 tablet (4 mg total) by mouth every 6 (six) hours. Prn n/v. 06/20/16   Billy Fischer, MD  oxyCODONE-acetaminophen (PERCOCET/ROXICET) 5-325 MG tablet Take 2 tablets by mouth every 4 (four) hours as needed for severe pain. 07/14/16   Kinnie Feil, PA-C  promethazine (PHENERGAN) 25 MG tablet Take 25 mg by mouth every 6 (six) hours as needed for nausea or vomiting.    [provider]  zolpidem (AMBIEN) 10 MG tablet Take 10 mg by mouth at bedtime as needed for sleep.    [provider]    Inpatient Medications: Scheduled Meds: . amLODipine  5 mg Oral Daily  . ARIPiprazole  10 mg Oral Daily  . enoxaparin (LOVENOX) injection  40 mg Subcutaneous Q24H  . fluconazole  200 mg Oral QHS  . insulin aspart  0-9 Units Subcutaneous Q4H  . lisinopril  10 mg Oral Daily  . nitroGLYCERIN  0.5 inch Topical Q8H  . ondansetron  4 mg Oral Q6H   Continuous Infusions: . sodium chloride 100 mL/hr at 12/07/17 0022   PRN Meds: albuterol, cloNIDine, nitroGLYCERIN, oxyCODONE-acetaminophen, zolpidem  Allergies:    Allergies  Allergen Reactions  . Hydrocodone Itching  . Latex Rash    Social History:   Social History   Socioeconomic History  . Marital status: Single    Spouse name: Not on file  . Number of children: Not on file  . Years of education: Not on file  . Highest education level: Not on  file  Occupational History  . Not on file  Social Needs  . Financial resource strain: Not on file  . Food insecurity:    Worry: Not on file    Inability: Not on file  . Transportation needs:    Medical: Not on file    Non-medical: Not on file  Tobacco Use  . Smoking status: Current Every Day Smoker    Packs/day: 0.50    Types: Cigarettes  . Smokeless tobacco: Never Used  Substance and Sexual Activity  . Alcohol use: No  . Drug use: Yes    Types: Marijuana, Cocaine  . Sexual activity: Not on file  Lifestyle  . Physical activity:    Days per week: Not on file    Minutes per session: Not on file  . Stress: Not on file  Relationships  . Social connections:    Talks on phone: Not on file    Gets together: Not on file    Attends religious service: Not on file    Active member of club or organization: Not on file    Attends meetings of clubs or organizations: Not on file    Relationship status: Not on file  . Intimate partner violence:    Fear of current or ex partner: Not on file    Emotionally abused: Not on file    Physically abused: Not on file    Forced sexual activity: Not on file  Other Topics Concern  . Not on file  Social History Narrative  . Not on file    Family History:   Family History  Problem Relation Age of Onset  . Hypertension Mother   . CAD Mother 56       died of MI at age 54  . Hypertension Father    Family Status:  Family Status  Relation Name Status  . Mother  Deceased  . Father  Deceased    ROS:  Please see the history of present illness.  All other ROS reviewed and negative.     Physical Exam/Data:   Vitals:   12/06/17 1836 12/06/17 2009 12/07/17 0023 12/07/17 0424  BP: (!) 189/118 (!) 138/92 130/82 (!) 150/86  Pulse: 63 80 72 74  Resp:   20   Temp: 98.3 F (36.8 C) 98.1 F (36.7 C) 98.3 F (36.8 C) 98.4 F (36.9 C)  TempSrc: Oral Oral Oral Oral  SpO2: 100% 100% 98% 98%  Weight:    201 lb 6.4 oz (91.4 kg)  Height:         Intake/Output Summary (Last 24 hours) at 12/07/2017 0733 Last data filed at 12/07/2017 0427 Gross per 24 hour  Intake 1000 ml  Output 300 ml  Net 700 ml   Filed Weights   12/06/17 1830 12/07/17 0424  Weight: 199 lb 11.2 oz (90.6 kg) 201 lb 6.4 oz (91.4 kg)   Body mass index is 34.57 kg/m.   General: Obese, well developed, well nourished, NAD Skin: Warm, dry, intact  Head: Normocephalic, atraumatic, clear, moist mucus membranes. Neck: Negative for carotid bruits. No JVD Lungs:Clear to ausculation bilaterally. No wheezes, rales, or rhonchi. Breathing is unlabored. Cardiovascular: RRR with S1 S2. No murmurs, rubs or gallops Abdomen: Soft, non-tender, non-distended with normoactive bowel sounds. No obvious abdominal masses. MSK: Strength and tone appear normal for age. 5/5 in all extremities Extremities: No edema. No clubbing or cyanosis. DP/PT pulses 2+ bilaterally Neuro: Alert and oriented. No focal deficits. No facial asymmetry. MAE spontaneously. Psych: Responds to questions appropriately with normal affect.    EKG:  The EKG was personally reviewed and demonstrates:  12/06/17 NSR HR 71 with no acute ischemic changes Telemetry:  Telemetry was personally reviewed and demonstrates: 12/07/17 NSR HR 77  Relevant CV Studies:  ECHO: 07/19/2017 Study Conclusions  - Left ventricle: The cavity size was normal. Wall thickness was   increased in a pattern of mild LVH. Systolic function was normal.   The estimated ejection fraction was in the range of 60% to 65%.   Wall motion was normal; there were no regional wall motion   abnormalities. Left ventricular diastolic function parameters   were normal. -  Aortic valve: Transvalvular velocity was within the normal range.   There was no stenosis. There was mild regurgitation. - Mitral valve: Transvalvular velocity was within the normal range.   There was no evidence for stenosis. There was no regurgitation. - Right ventricle: The cavity  size was normal. Wall thickness was   normal. Systolic function was normal. - Atrial septum: No defect or patent foramen ovale was identified   by color flow Doppler. - Tricuspid valve: There was physiologic regurgitation.  Echocardiogram: Scheduled for 12/07/2017  CATH: None  Laboratory Data:  Chemistry Recent Labs  Lab 12/06/17 1135 12/07/17 0512  NA 136 137  K 3.4* 3.4*  CL 101 104  CO2 27 26  GLUCOSE 377* 210*  BUN <5* 5*  CREATININE 0.95 0.86  CALCIUM 8.9 8.2*  GFRNONAA >60 >60  GFRAA >60 >60  ANIONGAP 8 7    Total Protein  Date Value Ref Range Status  12/07/2017 5.6 (L) 6.5 - 8.1 g/dL Final   Albumin  Date Value Ref Range Status  12/07/2017 2.8 (L) 3.5 - 5.0 g/dL Final   AST  Date Value Ref Range Status  12/07/2017 12 (L) 15 - 41 U/L Final   ALT  Date Value Ref Range Status  12/07/2017 8 (L) 14 - 54 U/L Final   Alkaline Phosphatase  Date Value Ref Range Status  12/07/2017 63 38 - 126 U/L Final   Total Bilirubin  Date Value Ref Range Status  12/07/2017 0.5 0.3 - 1.2 mg/dL Final   Hematology Recent Labs  Lab 12/06/17 1135 12/07/17 0512  WBC 7.1 6.6  RBC 4.02 3.51*  HGB 10.1* 9.0*  HCT 33.9* 29.7*  MCV 84.3 84.6  MCH 25.1* 25.6*  MCHC 29.8* 30.3  RDW 15.1 15.2  PLT 255 219   Cardiac Enzymes Recent Labs  Lab 12/06/17 1714 12/07/17 0512  TROPONINI <0.03 <0.03    Recent Labs  Lab 12/06/17 1142 12/06/17 1623  TROPIPOC 0.00 0.00    BNPNo results for input(s): BNP, PROBNP in the last 168 hours.  DDimer  Recent Labs  Lab 12/06/17 1135  DDIMER 0.79*   TSH:  Lab Results  Component Value Date   TSH 0.366 07/19/2017   Lipids: Lab Results  Component Value Date   CHOL 164 12/07/2017   HDL 43 12/07/2017   LDLCALC 101 (H) 12/07/2017   TRIG 98 12/07/2017   CHOLHDL 3.8 12/07/2017   HgbA1c: Lab Results  Component Value Date   HGBA1C 11.6 (H) 12/07/2017    Radiology/Studies:  Dg Chest 2 View  Result Date:  12/06/2017 CLINICAL DATA:  Pt reports she has had right sided chest pain radiating into the right arm that started at church. Pt states she is currently homeless and is not taking any of her medications for diabetes, hypertension or bipolar disorder. EXAM: CHEST - 2 VIEW COMPARISON:  07/19/2017 FINDINGS: Cardiac silhouette is mildly enlarged. No mediastinal or hilar masses. No evidence of adenopathy. Clear lungs.  No pleural effusion or pneumothorax. Skeletal structures are intact. IMPRESSION: No active cardiopulmonary disease. Electronically Signed   By: Lajean Manes M.D.   On: 12/06/2017 12:36   Ct Angio Chest Pe W/cm &/or Wo Cm  Result Date: 12/06/2017 CLINICAL DATA:  Right-sided chest pain. EXAM: CT ANGIOGRAPHY CHEST WITH CONTRAST TECHNIQUE: Multidetector CT imaging of the chest was performed using the standard protocol during bolus administration of intravenous contrast. Multiplanar CT image reconstructions and MIPs were obtained to evaluate the vascular anatomy. CONTRAST:  163m ISOVUE-370 IOPAMIDOL (  ISOVUE-370) INJECTION 76% COMPARISON:  Chest x-ray from same day. FINDINGS: Cardiovascular: Satisfactory opacification of the pulmonary arteries to the segmental level. No evidence of pulmonary embolism. Mild cardiomegaly. No pericardial effusion. Normal caliber thoracic aorta. No aortic dissection. Mediastinum/Nodes: No enlarged mediastinal, hilar, or axillary lymph nodes. Thyroid gland, trachea, and esophagus demonstrate no significant findings. Lungs/Pleura: Mild bilateral lower lobe subsegmental atelectasis. No focal consolidation, pleural effusion, or pneumothorax. No suspicious pulmonary nodule. Upper Abdomen: No acute abnormality. Musculoskeletal: No chest wall abnormality. No acute or significant osseous findings. Review of the MIP images confirms the above findings. IMPRESSION: 1. No evidence of pulmonary embolism. No acute intrathoracic process. 2. Mild cardiomegaly. Electronically Signed   By:  Titus Dubin M.D.   On: 12/06/2017 16:12   Assessment and Plan:   1.  Chest pain with no prior CAD history: -Patient presented to [facility] on 12/06/2017 with complaints of one episode right-sided, non radiating chest pain which began approximately 10 am on 12/06/17 while at church (no associated symptoms). She has no prior CAD however, has strong family history of premature CAD in her mother who died at 80yo of MI. Her father has hx of MI, however is still living. She reports being under tremendous amounts of stress in relation to being homeless with her two children for several months. She has many risk factors including family hx of CAD, personal hx of uncontrolled DM2 and HTN.  -Last echocardiogram performed 07/19/2017 with normal LV function at 60-65% with no wall motion abnormalities -No prior cardiac cath -Troponin, negative at <0.03, <0.03 -Reports ongoing chest pain, rated a 7/10 in severity -EKG with no acute ischemic changes -Chest pain likely in the setting of recent cocaine use? Stress?  -Given her risk factors, consider stress test or coronary CT for further ischemic evaluation>>> creatinine stable at 0.86. HR in the 70's  -No beta-blocker in the setting of recent cocaine use -Currently with nitro paste in place -Will start ASA if ACS suspected ACS  2.  HTN: -Elevated, 150/86, 130/82, 138/92>> currently improved from admission BPs -Continue amlodipine 5, lisinopril 10 -Pt reports not taking her medications for "months" -Will place case management referral to help with OP resources   3.  Substance abuse: -Patient reports ongoing tobacco and cocaine use -UDS positive for cocaine and THC -No BB in the setting of recent cocaine use -Chest pain likely in the setting of cocaine use  4.  DM 2: -Uncontrolled, hemoglobin A1c 12/07/2017, 11.6 -SSI for glucose control while inpatient -Per primary team  5. Bipolar disorder: -Continue Abilify -Per primary team   For questions or  updates, please contact [place] Please consult [email or web address] for contact info under Cardiology/STEMI.   SignedKathyrn Drown NP-C [facility] Pager: [phone] 12/07/2017 7:33 AM  As above, patient seen and examined.  Briefly she is a 42 year old female with past medical history of diabetes mellitus, hypertension, bipolar disorder, substance abuse for evaluation of chest pain.  She has had occasional chest pain in the past but history is somewhat difficult.  She developed recurrent right-sided chest pain at approximately 11 AM yesterday.  The pain radiated to her back.  It increased with certain movements.  It has been persistent since initiation without completely resolving.  She was admitted and cardiology now asked to evaluate.  Electrocardiogram shows sinus rhythm with nonspecific ST changes.  Enzymes are negative.  Drug screen is positive for cocaine.  CTA shows no pulmonary embolus.  1 chest pain-symptoms extremely atypical.  They have been continuous  for 22 hours without completely resolving.  Electrocardiogram shows no significant ST changes and enzymes are negative.  Some increased with certain movements.  Would not pursue further ischemia evaluation.  2 noncompliance-patient has not been taking medications.  I discussed the importance of compliance.  If she ever indeed is diagnosed with coronary disease she would need to demonstrate compliance with medications before intervention could be performed.  3 substance abuse-patient counseled on discontinuing cocaine use.    4 tobacco abuse-patient counseled on discontinuing.  5 hypertension-would continue lisinopril and amlodipine.  Advance as necessary.  Would avoid beta blockade given cocaine use.  6 diabetes mellitus-uncontrolled.  Patient not taking medications.  Needs follow-up with primary care.    We will sign off.  Please call with questions.   Kirk Ruths, MD

## 2017-12-07 NOTE — Plan of Care (Signed)

## 2017-12-07 NOTE — Telephone Encounter (Signed)
Call received from Tomi Bamberger, RN CM requesting a hospital follow up appointment for the patient @ [facility].  She has been scheduled for a [facility]itional Care [facility] (T[facility]) appointment with Dr Alvis Lemmings on 12/14/17 @ 1410 and the information was placed on the AVS. The patient is currently homeless and is seeking shelter for herself and her 2 children. This CM called [facility]. Spoke to [employer] regarding shelter options for women with children.  She suggested the [employer], [employer], [email or web address], [employer] and [employer].  This information was shared with Tomi Bamberger, RN CM.   This CM also called [facility] # [phone] with  [facility] but there was informed that they have no openings and suggested the [facility].    he  TCC, the [facility] can assist with providing transportation as long as the patient is residing in [place]. The patient can obtain her medication at [facility] and will be able to charge her medications ( there are some exceptions)  to an account if she is not able to pay the co-pays. She currently has no insurance.  She can also schedule an appointment to meet with the [facility] Financial Counselor to apply for a Blue Card/Orange Card and [employer].

## 2017-12-07 NOTE — Discharge Summary (Signed)
Kaitlyn Gray, is a 42 y.o. female  DOB 1976-04-27  MRN [id number].  Admission date:  12/06/2017  Admitting Physician  Jani Gravel, MD  Discharge Date:  12/07/2017   Primary MD  Placey, Audrea Muscat, NP  Recommendations for primary care physician for things to follow:  1) try to get help with drug rehab specifically help to help you quit cocaine use 2) you are advised to quit smoking tobacco, marijuana and cocaine 3) take your blood pressure medications as advised to prevent increased risk for heart attack, vision loss, stroke, heart failure and kidney failure 4) low-salt diet advised 5) follow-up with your primary care doctor for reevaluation of your diabetes and blood pressure   Admission Diagnosis  Precordial pain [R07.2] Hyperglycemia [R73.9] Essential hypertension [I10]   Discharge Diagnosis  Precordial pain [R07.2] Hyperglycemia [R73.9] Essential hypertension [I10]    Principal Problem:   Chest pain Active Problems:   Diabetes mellitus without complication ([place])   Hypokalemia   Hypertension      Past Medical History:  Diagnosis Date  . Bipolar 1 disorder ([place])   . 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 ([place]) 07/19/2017  . Hypertension     Past Surgical History:  Procedure Laterality Date  . CESAREAN SECTION         HPI  from the history and physical done on the day of admission:    Kaitlyn Gray  is a 42 y.o. female, w dm1, hypertension, bipolar, Nicotine dep, + family hx of CAD (mother died at age 36 from MI), polysubstance abuse (coccaine, marijuana, last use 6/22) apparently was at church at 10 am and developed chest pain. The chest pain was sharp, substernal and right sided.  Pt continues to have some pain, no radiation. Pt denies fever, chills, cough, palp, sob, n/v, diarrhea, brbpr.  Pt presented to ED for evaluation.   In ED,  EKG nsr at 70  nl axis, poor R progression, no st-t changed c/w ischemia  CTA chest IMPRESSION: 1. No evidence of pulmonary embolism. No acute intrathoracic process. 2. Mild cardiomegaly.  Trop 0.00 D dimer 0.79 Na 136, K 3.4  Glucose 377 Bun <5, creatinine 0.95 Wbc 7.1, Hgb 10.1, Plt 255  Ast 15, Alt 10 Lipase 28  Pt will be admitted for chest pain w recent coccaine use yesterday     Hospital Course:   1) atypical chest pain--ruled out for ACS by cardiac enzymes and EKG, cardiology consult appreciated, atypical chest pain in the setting of positive cocaine test,  2)HTN--avoid beta-blockers given ongoing cocaine use, may use vasodilators including amlodipine 10 mg daily and isosorbide 30 mg daily  3)DM-uncontrolled with A1c of 11.6, patient was noncompliant with insulin regimen, may resume home insulin regimen, .  4)Polysubstance Abuse-patient is not sure she is ready to quit drugs, UDS positive for marijuana and cocaine, abstinence from drugs advised  5)Tobacco Abuse-smoking cessation strongly advised, may use OTC nicotine patch  6)Mood DO- stable, continue Abilify  7)Dispo--- social worker trying to  help patient and he keeps getting to a shelter, case manager trying to have patient with prescription/medication assistance  Discharge Condition: stable  Follow UP  Follow-up Information    [facility] Follow up on 12/14/2017.   Why:  at 2:10pm for an appointment with Dr Denita Lung information: [place] [id number] [phone]       [facility]. Call.   Why:  this location for assistance with family shelter Contact information: [address] [place] [address] [phone]       [facility]. Call.   Why:  this location for assistance with family shelter Contact information: [place]. Call.   Why:  Please call this location  for assistance with family shelter.  Contact information: [phone]           Consults obtained - cardiology  Diet and Activity recommendation:  As advised  Discharge Instructions    Discharge Instructions    Call MD for:  difficulty breathing, headache or visual disturbances   Complete by:  As directed    Call MD for:  persistant dizziness or light-headedness   Complete by:  As directed    Call MD for:  persistant nausea and vomiting   Complete by:  As directed    Call MD for:  severe uncontrolled pain   Complete by:  As directed    Call MD for:  temperature >100.4   Complete by:  As directed    Diet - low sodium heart healthy   Complete by:  As directed    Discharge instructions   Complete by:  As directed    1) try to get help with drug rehab specifically help to help you quit cocaine use 2) you are advised to quit smoking tobacco, marijuana and cocaine 3) take your blood pressure medications as advised to prevent increased risk for heart attack, vision loss, stroke, heart failure and kidney failure 4) low-salt diet advised 5) follow-up with your primary care doctor for reevaluation of your diabetes and blood pressure   Increase activity slowly   Complete by:  As directed         Discharge Medications     Allergies as of 12/07/2017      Reactions   Hydrocodone Itching   Latex Rash      Medication List    STOP taking these medications   insulin aspart 100 UNIT/ML FlexPen Commonly known as:  NOVOLOG FLEXPEN   insulin aspart 100 UNIT/ML injection Commonly known as:  novoLOG   lisinopril 10 MG tablet Commonly known as:  PRINIVIL,ZESTRIL   promethazine 25 MG tablet Commonly known as:  PHENERGAN     TAKE these medications   albuterol 108 (90 Base) MCG/ACT inhaler Commonly known as:  PROAIR HFA Inhale 2 puffs into the lungs every 6 (six) hours as needed for wheezing or shortness of breath. What changed:  how much to take   amLODipine 10 MG  tablet Commonly known as:  NORVASC Take 1 tablet (10 mg total) by mouth daily. What changed:    medication strength  how much to take   ARIPiprazole 10 MG tablet Commonly known as:  ABILIFY Take 1 tablet (10 mg total) by mouth daily.   blood glucose meter kit and supplies Relion Prime or Dispense other brand based on patient and insurance preference. Use up to four times daily as directed. (FOR  ICD-9 250.00, 250.01).   blood glucose meter kit and supplies Kit Dispense based on patient and insurance preference. Use up to four times daily as directed. (FOR ICD-9 250.00, 250.01). Please give 3 months supply of lancets and test strips to allow her to check QID.   doxycycline 100 MG tablet Commonly known as:  ADOXA Take 1 tablet (100 mg total) by mouth 2 (two) times daily.   fluconazole 200 MG tablet Commonly known as:  DIFLUCAN Take 1 tablet (200 mg total) by mouth daily.   Insulin Glargine 100 UNIT/ML Solostar Pen Commonly known as:  LANTUS SOLOSTAR Inject 45 Units into the skin every morning.   insulin lispro 100 UNIT/ML KiwkPen Commonly known as:  HUMALOG KWIKPEN Inject 0-0.2 mLs (0-20 Units total) into the skin 4 (four) times daily -  before meals and at bedtime. Inject 0-20 Units into the skin 3 (three) times daily with meals. CBG 70 - 120: 0 units  CBG 121 - 150: 3 units  CBG 151 - 200: 4 units  CBG 201 - 250: 7 units  CBG 251 - 300: 11 units  CBG 301 - 350: 15 units  CBG 351 - 400: 20 units   insulin lispro 100 UNIT/ML KiwkPen Commonly known as:  HUMALOG KWIKPEN Inject 0.08 mLs (8 Units total) into the skin 3 (three) times daily. With Meals   insulin lispro 100 UNIT/ML KiwkPen Commonly known as:  HUMALOG KWIKPEN Inject 0-0.05 mLs (0-5 Units total) into the skin at bedtime. Inject 0-5 Units into the skin at Bedtime. CBG 70 - 120: 0 units  CBG 121 - 150: 0 units  CBG 151 - 200: 0 units  CBG 201 - 250: 2 units  CBG 251 - 300: 3 units  CBG 301 - 350: 4 units  CBG  351 - 400: 5 units   Insulin Pen Needle 31G X 5 MM Misc Use as directed   isosorbide mononitrate 30 MG 24 hr tablet Commonly known as:  IMDUR Take 1 tablet (30 mg total) by mouth daily.   meloxicam 15 MG tablet Commonly known as:  MOBIC Take 15 mg by mouth daily as needed for pain.   metFORMIN 1000 MG tablet Commonly known as:  GLUCOPHAGE Take 1 tablet (1,000 mg total) by mouth 2 (two) times daily with a meal. What changed:    medication strength  how much to take   ondansetron 4 MG tablet Commonly known as:  ZOFRAN Take 1 tablet (4 mg total) by mouth every 6 (six) hours. Prn n/v.   oxyCODONE-acetaminophen 5-325 MG tablet Commonly known as:  PERCOCET/ROXICET Take 2 tablets by mouth every 4 (four) hours as needed for severe pain.   ranitidine 300 MG tablet Commonly known as:  ZANTAC Take 1 tablet (300 mg total) by mouth at bedtime. For stomach   zolpidem 10 MG tablet Commonly known as:  AMBIEN Take 10 mg by mouth at bedtime as needed for sleep.       Major procedures and Radiology Reports - PLEASE review detailed and final reports for all details, in brief  Dg Chest 2 View  Result Date: 12/06/2017 CLINICAL DATA:  Pt reports she has had right sided chest pain radiating into the right arm that started at church. Pt states she is currently homeless and is not taking any of her medications for diabetes, hypertension or bipolar disorder. EXAM: CHEST - 2 VIEW COMPARISON:  07/19/2017 FINDINGS: Cardiac silhouette is mildly enlarged. No mediastinal or hilar masses. No evidence of adenopathy.  Clear lungs.  No pleural effusion or pneumothorax. Skeletal structures are intact. IMPRESSION: No active cardiopulmonary disease. Electronically Signed   By: Lajean Manes M.D.   On: 12/06/2017 12:36   Ct Angio Chest Pe W/cm &/or Wo Cm  Result Date: 12/06/2017 CLINICAL DATA:  Right-sided chest pain. EXAM: CT ANGIOGRAPHY CHEST WITH CONTRAST TECHNIQUE: Multidetector CT imaging of the chest was  performed using the standard protocol during bolus administration of intravenous contrast. Multiplanar CT image reconstructions and MIPs were obtained to evaluate the vascular anatomy. CONTRAST:  153m ISOVUE-370 IOPAMIDOL (ISOVUE-370) INJECTION 76% COMPARISON:  Chest x-ray from same day. FINDINGS: Cardiovascular: Satisfactory opacification of the pulmonary arteries to the segmental level. No evidence of pulmonary embolism. Mild cardiomegaly. No pericardial effusion. Normal caliber thoracic aorta. No aortic dissection. Mediastinum/Nodes: No enlarged mediastinal, hilar, or axillary lymph nodes. Thyroid gland, trachea, and esophagus demonstrate no significant findings. Lungs/Pleura: Mild bilateral lower lobe subsegmental atelectasis. No focal consolidation, pleural effusion, or pneumothorax. No suspicious pulmonary nodule. Upper Abdomen: No acute abnormality. Musculoskeletal: No chest wall abnormality. No acute or significant osseous findings. Review of the MIP images confirms the above findings. IMPRESSION: 1. No evidence of pulmonary embolism. No acute intrathoracic process. 2. Mild cardiomegaly. Electronically Signed   By: WTitus DubinM.D.   On: 12/06/2017 16:12    Micro Results    Recent Results (from the past 240 hour(s))  MRSA PCR Screening     Status: None   Collection Time: 12/06/17  6:38 PM  Result Value Ref Range Status   MRSA by PCR NEGATIVE NEGATIVE Final    Comment:        The GeneXpert MRSA Assay (FDA approved for NASAL specimens only), is one component of a comprehensive MRSA colonization surveillance program. It is not intended to diagnose MRSA infection nor to guide or monitor treatment for MRSA infections. Performed at [facility] [address] [address] [place] [address]       Today   Subjective    Elvena FKantnertoday has no new complaints, No fever  Or chills , No nausea, vomiting, diarrhea           Patient has been seen and examined prior to  discharge   Objective   Blood pressure (!) 145/85, pulse 68, temperature 98.4 F (36.9 C), temperature source Oral, resp. rate 20, height _0  (1.626 m), weight 91.4 kg (201 lb 6.4 oz), SpO2 100 %.   Intake/Output Summary (Last 24 hours) at 12/07/2017 1430 Last data filed at 12/07/2017 0427 Gross per 24 hour  Intake 1000 ml  Output 300 ml  Net 700 ml    Exam Gen:- Awake Alert,  In no apparent distress  HEENT:- [place].AT, No sclera icterus Neck-Supple Neck,No JVD,.  Lungs-  CTAB , good air movement CV- S1, S2 normal Abd-  +ve B.Sounds, Abd Soft, No tenderness,    Extremity/Skin:- No  edema,   good pulses Psych-affect is appropriate, oriented x3 Neuro-no new focal deficits, no tremors   Data Review   CBC w Diff:  Lab Results  Component Value Date   WBC 6.6 12/07/2017   HGB 9.0 (L) 12/07/2017   HCT 29.7 (L) 12/07/2017   PLT 219 12/07/2017   LYMPHOPCT 31 11/06/2017   MONOPCT 7 11/06/2017   EOSPCT 1 11/06/2017   BASOPCT 0 11/06/2017    CMP:  Lab Results  Component Value Date   NA 137 12/07/2017   K 3.4 (L) 12/07/2017   CL 104 12/07/2017  CO2 26 12/07/2017   BUN 5 (L) 12/07/2017   CREATININE 0.86 12/07/2017   PROT 5.6 (L) 12/07/2017   ALBUMIN 2.8 (L) 12/07/2017   BILITOT 0.5 12/07/2017   ALKPHOS 63 12/07/2017   AST 12 (L) 12/07/2017   ALT 8 (L) 12/07/2017  .   Total Discharge time is about 33 minutes  Roxan Hockey M.D on 12/07/2017 at 2:30 PM  [facility]   Office  [phone]  Voice Recognition Viviann Spare dictation system was used to create this note, attempts have been made to correct errors. Please contact the author with questions and/or clarifications.

## 2017-12-07 NOTE — Progress Notes (Signed)
Pt. resting in bed with eyes close. Responds to verbal stimuli. Even and unlabored breathing on room air. Telemetry monitoring in place. Pt. Refused lab draws for Troponin. States "if you can't take it out of the IV, don't stick me" educated that we do not draw blood from IV lines on the floor and educated on importance of lab draws. Pt. continuous to refuse. NPO since midnight. Verbalizes understanding. VS stable. Bed locked and low. Call bell within reach.

## 2017-12-08 ENCOUNTER — Telehealth: Payer: Self-pay

## 2017-12-08 LAB — HEPATITIS PANEL, ACUTE
HCV Ab: <0.1 s/co ratio (ref 0.0–0.9)
HEP A IGM: NEGATIVE
HEP B S AG: NEGATIVE
Hep B C IgM: NEGATIVE

## 2017-12-08 NOTE — Telephone Encounter (Signed)
Transition Care Management Follow-up Telephone Call  Patient was discharged 12/07/2017.    Attempted to contact the patient.  Call placed to # [phone] (M) and a HIPAA compliant voicemail message was left requesting a call back to # [phone].

## 2017-12-09 ENCOUNTER — Telehealth: Payer: Self-pay

## 2017-12-09 NOTE — Telephone Encounter (Signed)
Transition Care Management Follow-up Telephone Call  Attempt # 2 -    Attempted to contact patient.  Call placed to # [phone] and the phone stopped ringing after 7 rings.  It did not disconnect but there was no option to leave a message and no one was on the line. Call placed again to the same # and the person who answered stated that the patient was not there and she would give her the message to call this CM back at # [phone] @ [facility]. When asked who was speaking, the person said, " her cousin" but di did not want to give her name.

## 2017-12-11 ENCOUNTER — Telehealth: Payer: Self-pay | Admitting: *Deleted

## 2017-12-11 NOTE — Telephone Encounter (Signed)
Call placed to patient #[phone] to check on her status following hospital discharge and to remind her of appointment and medication. Spoke with patient's aunt and she informed me that patient doesn't live with her and she hasn't seen her in a while. She didn't understand why we keep calling her and would like her number to be removed from Epic. Apologized and nformed Myriam Jacobson that that was the number that we had on patient's chart and that I would remove it. Informed her that patient had an appointment at our office and that was the reason for my call. Myriam Jacobson understood and stated that she wasn't sure if she will see patient any time soon to give her the message.   Phone number has been removed.

## 2017-12-11 NOTE — Telephone Encounter (Signed)
Also, spoke with Butch Penny, [facility] clinical pharmacist, regarding patient's medication. Luke informed me that medications are ready for pick up, but patient hasn't picked them up.

## 2017-12-12 ENCOUNTER — Encounter (HOSPITAL_COMMUNITY): Payer: Self-pay | Admitting: Emergency Medicine

## 2017-12-12 ENCOUNTER — Emergency Department (HOSPITAL_COMMUNITY)
Admission: EM | Admit: 2017-12-12 | Discharge: 2017-12-12 | Disposition: A | Discharge status: Home / Self Care | Payer: Medicaid Other | Attending: Emergency Medicine | Admitting: Emergency Medicine

## 2017-12-12 DIAGNOSIS — Z79899 Other long term (current) drug therapy: Secondary | ICD-10-CM | POA: Diagnosis not present

## 2017-12-12 DIAGNOSIS — F17210 Nicotine dependence, cigarettes, uncomplicated: Secondary | ICD-10-CM | POA: Insufficient documentation

## 2017-12-12 DIAGNOSIS — I10 Essential (primary) hypertension: Secondary | ICD-10-CM | POA: Diagnosis not present

## 2017-12-12 DIAGNOSIS — Z9119 Patient's noncompliance with other medical treatment and regimen: Secondary | ICD-10-CM | POA: Diagnosis not present

## 2017-12-12 DIAGNOSIS — Z794 Long term (current) use of insulin: Secondary | ICD-10-CM | POA: Diagnosis not present

## 2017-12-12 DIAGNOSIS — R739 Hyperglycemia, unspecified: Secondary | ICD-10-CM

## 2017-12-12 DIAGNOSIS — E1165 Type 2 diabetes mellitus with hyperglycemia: Secondary | ICD-10-CM | POA: Insufficient documentation

## 2017-12-12 DIAGNOSIS — Z590 Homelessness: Secondary | ICD-10-CM | POA: Diagnosis not present

## 2017-12-12 LAB — CBC WITH DIFFERENTIAL/PLATELET
BASOS PCT: 0 %
Basophils Absolute: 0 10*3/uL (ref 0.0–0.1)
EOS ABS: 0.1 10*3/uL (ref 0.0–0.7)
EOS PCT: 2 %
HCT: 32 % — ABNORMAL LOW (ref 36.0–46.0)
HEMOGLOBIN: 10.1 g/dL — AB (ref 12.0–15.0)
LYMPHS PCT: 36 %
Lymphs Abs: 2.2 10*3/uL (ref 0.7–4.0)
MCH: 26.3 pg (ref 26.0–34.0)
MCHC: 31.6 g/dL (ref 30.0–36.0)
MCV: 83.3 fL (ref 78.0–100.0)
MONOS PCT: 7 %
Monocytes Absolute: 0.4 10*3/uL (ref 0.1–1.0)
NEUTROS PCT: 55 %
Neutro Abs: 3.3 10*3/uL (ref 1.7–7.7)
Platelets: 311 10*3/uL (ref 150–400)
RBC: 3.84 MIL/uL — ABNORMAL LOW (ref 3.87–5.11)
RDW: 15.1 % (ref 11.5–15.5)
WBC: 6 10*3/uL (ref 4.0–10.5)

## 2017-12-12 LAB — BASIC METABOLIC PANEL
Anion gap: 9 (ref 5–15)
BUN: 8 mg/dL (ref 6–20)
CHLORIDE: 103 mmol/L (ref 98–111)
CO2: 26 mmol/L (ref 22–32)
Calcium: 8.8 mg/dL — ABNORMAL LOW (ref 8.9–10.3)
Creatinine, Ser: 0.91 mg/dL (ref 0.44–1.00)
GFR calc Af Amer: >60 mL/min (ref >60)
GFR calc non Af Amer: >60 mL/min (ref >60)
Glucose, Bld: 249 mg/dL — ABNORMAL HIGH (ref 70–99)
POTASSIUM: 3.5 mmol/L (ref 3.5–5.1)
SODIUM: 138 mmol/L (ref 135–145)

## 2017-12-12 MED ORDER — SODIUM CHLORIDE 0.9 % IV BOLUS
2000.0000 mL | Freq: Once | INTRAVENOUS | Status: AC
  Administered 2017-12-12: 2000 mL via INTRAVENOUS

## 2017-12-12 NOTE — ED Provider Notes (Signed)
[facility] DEPT Provider Note   CSN: [id number] Arrival date & time: 12/12/17  1013     History   Chief Complaint Chief Complaint  Patient presents with  . Hyperglycemia    HPI Kaitlyn Gray is a 42 y.o. female.  HPI   Patient presents by EMS reportedly for hyperglycemia.  She was at [facility], prior to transport.  Patient was recently admitted for chest pain, complicated by use of cocaine and medication noncompliance.  She had screening testing done and was discharged after 24 hours to follow-up with her PCP for ongoing management of her multiple medical problems.  Patient states she does not know why she is here or how she got here.  When I reminded her that she came by EMS she recalled that that actually happened.  She also now remembers that her blood sugar was high when EMS checked.  She states that she has not taken her insulin since she was discharged from the hospital.  She is homeless.  She last ate yesterday.  She denies nausea, vomiting, fever or chills.  She states that she "has anxiety and is nervous."  There are no other known modifying factors.  Past Medical History:  Diagnosis Date  . Bipolar 1 disorder ([place])   . 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 ([place]) 07/19/2017  . Hypertension     Patient Active Problem List   Diagnosis Date Noted  . Diabetes mellitus without complication ([place]) 74/01/1447  . Hypokalemia 12/06/2017  . Hypertension 12/06/2017  . 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 ([place]) 07/19/2017  . Bipolar disorder ([place]) 07/19/2017  . Polysubstance abuse ([place]) 07/19/2017  . Chest pain 07/19/2017  . Cocaine use disorder, severe, dependence ([place]) 03/24/2015  . Cannabis use disorder, moderate, dependence ([place]) 03/24/2015  . 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 CESAREAN SECTION       OB History   None      Home Medications    Prior to Admission medications   Medication Sig Start Date End Date Taking? Authorizing Provider  albuterol (PROAIR HFA) 108 (90 Base) MCG/ACT inhaler Inhale 2 puffs into the lungs every 6 (six) hours as needed for wheezing or shortness of breath. 12/07/17   Emokpae, Courage, MD  amLODipine (NORVASC) 10 MG tablet Take 1 tablet (10 mg total) by mouth daily. 12/07/17   Roxan Hockey, MD  ARIPiprazole (ABILIFY) 10 MG tablet Take 1 tablet (10 mg total) by mouth daily. 12/07/17   Roxan Hockey, MD  blood glucose meter kit and supplies KIT Dispense based on patient and insurance preference. Use up to four times daily as directed. (FOR ICD-9 250.00, 250.01). Please give 3 months supply of lancets and test strips to allow her to check QID. 12/07/17   Roxan Hockey, MD  blood glucose meter kit and supplies Relion Prime or Dispense other brand based on patient and insurance preference. Use up to four times daily as directed. (FOR ICD-9 250.00, 250.01). 12/07/17   Roxan Hockey, MD  doxycycline (ADOXA) 100 MG tablet Take 1 tablet (100 mg total) by mouth 2 (two) times daily. 07/22/17   Debbe Odea, MD  fluconazole (DIFLUCAN) 200 MG tablet Take 1 tablet (200 mg total) by mouth daily. 11/06/17   Muthersbaugh, Jarrett Soho, PA-C  Insulin Glargine (LANTUS SOLOSTAR) 100 UNIT/ML Solostar Pen Inject 45 Units into the skin every morning.  12/07/17   Roxan Hockey, MD  insulin lispro (HUMALOG KWIKPEN) 100 UNIT/ML KiwkPen Inject 0-0.2 mLs (0-20 Units total) into the skin 4 (four) times daily -  before meals and at bedtime. Inject 0-20 Units into the skin 3 (three) times daily with meals. CBG 70 - 120: 0 units  CBG 121 - 150: 3 units  CBG 151 - 200: 4 units  CBG 201 - 250: 7 units  CBG 251 - 300: 11 units  CBG 301 - 350: 15 units  CBG 351 - 400: 20 units 12/07/17   Emokpae, Courage, MD  insulin lispro (HUMALOG KWIKPEN) 100  UNIT/ML KiwkPen Inject 0.08 mLs (8 Units total) into the skin 3 (three) times daily. With Meals 12/07/17   Roxan Hockey, MD  insulin lispro (HUMALOG KWIKPEN) 100 UNIT/ML KiwkPen Inject 0-0.05 mLs (0-5 Units total) into the skin at bedtime. Inject 0-5 Units into the skin at Bedtime. CBG 70 - 120: 0 units  CBG 121 - 150: 0 units  CBG 151 - 200: 0 units  CBG 201 - 250: 2 units  CBG 251 - 300: 3 units  CBG 301 - 350: 4 units  CBG 351 - 400: 5 units 12/07/17   Emokpae, Courage, MD  Insulin Pen Needle 31G X 5 MM MISC Use as directed 12/07/17   Roxan Hockey, MD  isosorbide mononitrate (IMDUR) 30 MG 24 hr tablet Take 1 tablet (30 mg total) by mouth daily. 12/07/17 12/07/18  Roxan Hockey, MD  meloxicam (MOBIC) 15 MG tablet Take 15 mg by mouth daily as needed for pain.    [provider]  metFORMIN (GLUCOPHAGE) 1000 MG tablet Take 1 tablet (1,000 mg total) by mouth 2 (two) times daily with a meal. 12/07/17 12/07/18  Emokpae, Courage, MD  ondansetron (ZOFRAN) 4 MG tablet Take 1 tablet (4 mg total) by mouth every 6 (six) hours. Prn n/v. 12/07/17   Roxan Hockey, MD  oxyCODONE-acetaminophen (PERCOCET/ROXICET) 5-325 MG tablet Take 2 tablets by mouth every 4 (four) hours as needed for severe pain. 07/14/16   Kinnie Feil, PA-C  ranitidine (ZANTAC) 300 MG tablet Take 1 tablet (300 mg total) by mouth at bedtime. For stomach 12/07/17 12/07/18  Roxan Hockey, MD  zolpidem (AMBIEN) 10 MG tablet Take 10 mg by mouth at bedtime as needed for sleep.    [provider]    Family History Family History  Problem Relation Age of Onset  . Hypertension Mother   . CAD Mother 61       died of MI at age 36  . Hypertension Father     Social History Social History   Tobacco Use  . Smoking status: Current Every Day Smoker    Packs/day: 0.50    Types: Cigarettes  . Smokeless tobacco: Never Used  Substance Use Topics  . Alcohol use: No  . Drug use: Yes    Types: Marijuana, Cocaine      Allergies   Hydrocodone and Latex   Review of Systems Review of Systems  All other systems reviewed and are negative.    Physical Exam Updated Vital Signs BP 136/78 (BP Location: Right Arm)   Pulse 76   Temp 98.5 F (36.9 C) (Oral)   Resp 18   SpO2 99%   Physical Exam  Constitutional: She is oriented to person, place, and time. She appears well-developed and well-nourished.  HENT:  Head: Normocephalic and atraumatic.  Eyes: Pupils are equal, round, and reactive to light. Conjunctivae and EOM are normal.  Neck: Normal range of motion and phonation normal. Neck supple.  Cardiovascular: Normal rate.  Pulmonary/Chest: Effort normal.  Musculoskeletal: Normal range of motion.  Neurological: She is alert and oriented to person, place, and time. She exhibits normal muscle tone.  Skin: Skin is warm and dry.  Psychiatric: Her behavior is normal. Judgment and thought content normal.  Anxious  Nursing note and vitals reviewed.    ED Treatments / Results  Labs (all labs ordered are listed, but only abnormal results are displayed) Labs Reviewed  CBC WITH DIFFERENTIAL/PLATELET  BASIC METABOLIC PANEL  CBG MONITORING, ED    EKG None  Radiology No results found.  Procedures Procedures (including critical care time)  Medications Ordered in ED Medications  sodium chloride 0.9 % bolus 2,000 mL (has no administration in time range)     Initial Impression / Assessment and Plan / ED Course  I have reviewed the triage vital signs and the nursing notes.  Pertinent labs & imaging results that were available during my care of the patient were reviewed by me and considered in my medical decision making (see chart for details).      Patient Vitals for the past 24 hrs:  BP Temp Temp src Pulse Resp SpO2  12/12/17 1040 136/78 98.5 F (36.9 C) Oral 76 18 99 %    2:49 PM Reevaluation with update and discussion. After initial assessment and treatment, an updated  evaluation reveals patient has been sleeping most of the time, is awake now and alert.  She states that she does not have her medicines, currently.  She has no further complaints.  I offered to have case management see the patient to help with medication concerns.  Findings discussed with the patient and all questions were answered. Daleen Bo   Medical Decision Making: Hyperglycemia with medication noncompliance.  No evidence for DKA, severe bacterial infection or metabolic instability.  CRITICAL CARE-no Performed by: Daleen Bo   Nursing Notes Reviewed/ Care Coordinated Applicable Imaging Reviewed Interpretation of Laboratory Data incorporated into ED treatment  The patient appears reasonably screened and/or stabilized for discharge and I doubt any other medical condition or other [facility] requiring further screening, evaluation, or treatment in the ED at this time prior to discharge.  Plan: Home Medications-take usual medications; Home Treatments-rest, fluids, avoid illegal drugs.; return here if the recommended treatment, does not improve the symptoms; Recommended follow up-to be follow-up as needed.   Final Clinical Impressions(s) / ED Diagnoses   Final diagnoses:  None    ED Discharge Orders    None       Daleen Bo, MD 12/12/17 1450

## 2017-12-12 NOTE — ED Notes (Signed)
Bed: PJ09 Expected date:  Expected time:  Means of arrival:  Comments: 42 yo from [facility]- vision changes, uncooperative w/ EMS

## 2017-12-12 NOTE — ED Triage Notes (Addendum)
Patient at [facility] called EMS for hyperglycemia. States that she is out of insulin. Uncooperative, refuses to answer questions. Bipolar disorder. CBG 365

## 2017-12-12 NOTE — Discharge Instructions (Addendum)
Is important to take all of your medicine as prescribed.  Go see your doctor as soon as possible for further care and treatment.  Make sure you are drinking plenty of water.  Do not use cocaine or other illegal drugs.

## 2017-12-14 ENCOUNTER — Ambulatory Visit: Payer: Self-pay | Admitting: Family Medicine

## 2017-12-15 LAB — CBG MONITORING, ED
GLUCOSE-CAPILLARY: 307 mg/dL — AB (ref 70–99)
Glucose-Capillary: 237 mg/dL — ABNORMAL HIGH (ref 70–99)

## 2017-12-17 ENCOUNTER — Encounter (HOSPITAL_COMMUNITY): Payer: Self-pay | Admitting: *Deleted

## 2017-12-17 ENCOUNTER — Other Ambulatory Visit: Payer: Self-pay

## 2017-12-17 ENCOUNTER — Emergency Department (HOSPITAL_COMMUNITY)
Admission: EM | Admit: 2017-12-17 | Discharge: 2017-12-17 | Disposition: A | Discharge status: Home / Self Care | Payer: Medicaid Other | Attending: Emergency Medicine | Admitting: Emergency Medicine

## 2017-12-17 DIAGNOSIS — F17210 Nicotine dependence, cigarettes, uncomplicated: Secondary | ICD-10-CM | POA: Insufficient documentation

## 2017-12-17 DIAGNOSIS — F1420 Cocaine dependence, uncomplicated: Secondary | ICD-10-CM | POA: Insufficient documentation

## 2017-12-17 DIAGNOSIS — F319 Bipolar disorder, unspecified: Secondary | ICD-10-CM | POA: Diagnosis not present

## 2017-12-17 DIAGNOSIS — E876 Hypokalemia: Secondary | ICD-10-CM | POA: Insufficient documentation

## 2017-12-17 DIAGNOSIS — R739 Hyperglycemia, unspecified: Secondary | ICD-10-CM

## 2017-12-17 DIAGNOSIS — Z79899 Other long term (current) drug therapy: Secondary | ICD-10-CM | POA: Insufficient documentation

## 2017-12-17 DIAGNOSIS — Z91040 Latex allergy status: Secondary | ICD-10-CM | POA: Diagnosis not present

## 2017-12-17 DIAGNOSIS — Z794 Long term (current) use of insulin: Secondary | ICD-10-CM | POA: Diagnosis not present

## 2017-12-17 DIAGNOSIS — E1165 Type 2 diabetes mellitus with hyperglycemia: Secondary | ICD-10-CM | POA: Diagnosis not present

## 2017-12-17 DIAGNOSIS — R5383 Other fatigue: Secondary | ICD-10-CM | POA: Insufficient documentation

## 2017-12-17 DIAGNOSIS — I10 Essential (primary) hypertension: Secondary | ICD-10-CM | POA: Insufficient documentation

## 2017-12-17 DIAGNOSIS — F1220 Cannabis dependence, uncomplicated: Secondary | ICD-10-CM | POA: Diagnosis not present

## 2017-12-17 DIAGNOSIS — R5381 Other malaise: Secondary | ICD-10-CM | POA: Diagnosis present

## 2017-12-17 DIAGNOSIS — F1910 Other psychoactive substance abuse, uncomplicated: Secondary | ICD-10-CM

## 2017-12-17 LAB — CBC
HCT: 31.3 % — ABNORMAL LOW (ref 36.0–46.0)
Hemoglobin: 9.7 g/dL — ABNORMAL LOW (ref 12.0–15.0)
MCH: 25.9 pg — ABNORMAL LOW (ref 26.0–34.0)
MCHC: 31 g/dL (ref 30.0–36.0)
MCV: 83.5 fL (ref 78.0–100.0)
PLATELETS: 360 10*3/uL (ref 150–400)
RBC: 3.75 MIL/uL — ABNORMAL LOW (ref 3.87–5.11)
RDW: 15.3 % (ref 11.5–15.5)
WBC: 6.5 10*3/uL (ref 4.0–10.5)

## 2017-12-17 LAB — RAPID URINE DRUG SCREEN, HOSP PERFORMED
Amphetamines: NOT DETECTED
Benzodiazepines: NOT DETECTED
COCAINE: POSITIVE — AB
OPIATES: NOT DETECTED
Tetrahydrocannabinol: POSITIVE — AB

## 2017-12-17 LAB — BASIC METABOLIC PANEL
Anion gap: 6 (ref 5–15)
BUN: 9 mg/dL (ref 6–20)
CALCIUM: 8.6 mg/dL — AB (ref 8.9–10.3)
CO2: 26 mmol/L (ref 22–32)
CREATININE: 0.89 mg/dL (ref 0.44–1.00)
Chloride: 111 mmol/L (ref 98–111)
Glucose, Bld: 173 mg/dL — ABNORMAL HIGH (ref 70–99)
Potassium: 3.3 mmol/L — ABNORMAL LOW (ref 3.5–5.1)
SODIUM: 143 mmol/L (ref 135–145)

## 2017-12-17 LAB — CBG MONITORING, ED: GLUCOSE-CAPILLARY: 167 mg/dL — AB (ref 70–99)

## 2017-12-17 NOTE — ED Notes (Signed)
Bed: WC37 Expected date: 12/17/17 Expected time: 10:34 AM Means of arrival: Ambulance Comments: Generalized weakness

## 2017-12-17 NOTE — ED Provider Notes (Signed)
[facility] DEPT Provider Note   CSN: [id number] Arrival date & time: 12/17/17  1036     History   Chief Complaint Chief Complaint  Patient presents with  . Fatigue    HPI Kaitlyn Gray is a 42 y.o. female.  Patient brought in by EMS.  Patient's complaints fatigue general malaise.  Patient did voice to me that she does have some generalized body aches.  Patient with recent evaluation on June 29 for hyperglycemia.  Patient apparently has a history of bipolar disorder known to have substance abuse.  Normally cocaine and THC.  Patient stating she wants food.     Past Medical History:  Diagnosis Date  . Bipolar 1 disorder ([place])   . 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 Hypertension     Patient Active Problem List   Diagnosis Date Noted  . Diabetes mellitus without complication ([place]) 27/51/7001  . Hypokalemia 12/06/2017  . Hypertension 12/06/2017  . 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 Bipolar disorder ([place]) 07/19/2017  . Polysubstance abuse ([place]) 07/19/2017  . Chest pain 07/19/2017  . Cocaine use disorder, severe, dependence ([place]) 03/24/2015  . Cannabis use disorder, moderate, dependence ([place]) 03/24/2015  . 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 CESAREAN SECTION       OB History   None      Home Medications    Prior to Admission medications   Medication Sig Start Date End Date Taking? Authorizing Provider  albuterol (PROAIR HFA) 108 (90 Base) MCG/ACT inhaler Inhale 2 puffs into the lungs every 6 (six) hours as needed for wheezing or shortness of breath. 12/07/17   Emokpae, Courage, MD  amLODipine (NORVASC) 10 MG tablet Take 1 tablet (10 mg total) by mouth daily. 12/07/17   Roxan Hockey, MD  ARIPiprazole (ABILIFY) 10 MG  tablet Take 1 tablet (10 mg total) by mouth daily. 12/07/17   Roxan Hockey, MD  blood glucose meter kit and supplies KIT Dispense based on patient and insurance preference. Use up to four times daily as directed. (FOR ICD-9 250.00, 250.01). Please give 3 months supply of lancets and test strips to allow her to check QID. 12/07/17   Roxan Hockey, MD  blood glucose meter kit and supplies Relion Prime or Dispense other brand based on patient and insurance preference. Use up to four times daily as directed. (FOR ICD-9 250.00, 250.01). 12/07/17   Roxan Hockey, MD  doxycycline (ADOXA) 100 MG tablet Take 1 tablet (100 mg total) by mouth 2 (two) times daily. 07/22/17   Debbe Odea, MD  fluconazole (DIFLUCAN) 200 MG tablet Take 1 tablet (200 mg total) by mouth daily. 11/06/17   Muthersbaugh, Jarrett Soho, PA-C  Insulin Glargine (LANTUS SOLOSTAR) 100 UNIT/ML Solostar Pen Inject 45 Units into the skin every morning. 12/07/17   Emokpae, Courage, MD  insulin lispro (HUMALOG KWIKPEN) 100 UNIT/ML KiwkPen Inject 0-0.2 mLs (0-20 Units total) into the skin 4 (four) times daily -  before meals and at bedtime. Inject 0-20 Units into the skin 3 (three) times daily with meals. CBG 70 - 120: 0 units  CBG 121 - 150: 3 units  CBG 151 - 200: 4 units  CBG 201 - 250: 7 units  CBG 251 - 300: 11 units  CBG 301 - 350: 15 units  CBG 351 - 400: 20 units  12/07/17   Emokpae, Courage, MD  insulin lispro (HUMALOG KWIKPEN) 100 UNIT/ML KiwkPen Inject 0.08 mLs (8 Units total) into the skin 3 (three) times daily. With Meals 12/07/17   Roxan Hockey, MD  insulin lispro (HUMALOG KWIKPEN) 100 UNIT/ML KiwkPen Inject 0-0.05 mLs (0-5 Units total) into the skin at bedtime. Inject 0-5 Units into the skin at Bedtime. CBG 70 - 120: 0 units  CBG 121 - 150: 0 units  CBG 151 - 200: 0 units  CBG 201 - 250: 2 units  CBG 251 - 300: 3 units  CBG 301 - 350: 4 units  CBG 351 - 400: 5 units 12/07/17   Emokpae, Courage, MD  Insulin Pen Needle 31G X 5 MM  MISC Use as directed 12/07/17   Roxan Hockey, MD  isosorbide mononitrate (IMDUR) 30 MG 24 hr tablet Take 1 tablet (30 mg total) by mouth daily. 12/07/17 12/07/18  Roxan Hockey, MD  meloxicam (MOBIC) 15 MG tablet Take 15 mg by mouth daily as needed for pain.    [provider]  metFORMIN (GLUCOPHAGE) 1000 MG tablet Take 1 tablet (1,000 mg total) by mouth 2 (two) times daily with a meal. 12/07/17 12/07/18  Emokpae, Courage, MD  ondansetron (ZOFRAN) 4 MG tablet Take 1 tablet (4 mg total) by mouth every 6 (six) hours. Prn n/v. 12/07/17   Roxan Hockey, MD  oxyCODONE-acetaminophen (PERCOCET/ROXICET) 5-325 MG tablet Take 2 tablets by mouth every 4 (four) hours as needed for severe pain. 07/14/16   Kinnie Feil, PA-C  ranitidine (ZANTAC) 300 MG tablet Take 1 tablet (300 mg total) by mouth at bedtime. For stomach 12/07/17 12/07/18  Roxan Hockey, MD  zolpidem (AMBIEN) 10 MG tablet Take 10 mg by mouth at bedtime as needed for sleep.    [provider]    Family History Family History  Problem Relation Age of Onset  . Hypertension Mother   . CAD Mother 76       died of MI at age 20  . Hypertension Father     Social History Social History   Tobacco Use  . Smoking status: Current Every Day Smoker    Packs/day: 0.50    Types: Cigarettes  . Smokeless tobacco: Never Used  Substance Use Topics  . Alcohol use: No  . Drug use: Yes    Types: Marijuana, Cocaine     Allergies   Hydrocodone and Latex   Review of Systems Review of Systems  Constitutional: Positive for fatigue.  HENT: Negative for congestion.   Eyes: Positive for redness.  Respiratory: Negative for shortness of breath.   Cardiovascular: Negative for chest pain.  Gastrointestinal: Negative for abdominal pain.  Genitourinary: Negative for dysuria.  Musculoskeletal: Positive for myalgias.  Skin: Negative for rash.  Neurological: Negative for headaches.  Hematological: Does not bruise/bleed easily.    Psychiatric/Behavioral: Negative for confusion.     Physical Exam Updated Vital Signs BP (!) 141/90   Pulse 63   Temp 97.6 F (36.4 C) (Oral)   Resp (!) 23   Ht 1.626 m (_0 )   Wt 91.2 kg (201 lb)   SpO2 99%   BMI 34.50 kg/m   Physical Exam  Constitutional: She appears well-developed and well-nourished. No distress.  HENT:  Head: Normocephalic and atraumatic.  Mouth/Throat: Oropharynx is clear and moist.  Eyes: Pupils are equal, round, and reactive to light. Conjunctivae and EOM are normal.  Neck: Neck supple.  Cardiovascular: Normal rate, regular rhythm and normal heart sounds.  Pulmonary/Chest:  Effort normal and breath sounds normal. No respiratory distress.  Abdominal: Soft. Bowel sounds are normal. There is no tenderness.  Musculoskeletal: Normal range of motion.  Neurological: She is alert. No cranial nerve deficit or sensory deficit. She exhibits normal muscle tone. Coordination normal.  Skin: Skin is warm. No rash noted.  Nursing note and vitals reviewed.    ED Treatments / Results  Labs (all labs ordered are listed, but only abnormal results are displayed) Labs Reviewed  CBC - Abnormal; Notable for the following components:      Result Value   RBC 3.75 (*)    Hemoglobin 9.7 (*)    HCT 31.3 (*)    MCH 25.9 (*)    All other components within normal limits  BASIC METABOLIC PANEL - Abnormal; Notable for the following components:   Potassium 3.3 (*)    Glucose, Bld 173 (*)    Calcium 8.6 (*)    All other components within normal limits  RAPID URINE DRUG SCREEN, HOSP PERFORMED - Abnormal; Notable for the following components:   Cocaine POSITIVE (*)    Tetrahydrocannabinol POSITIVE (*)    Barbiturates   (*)    Value: Result not available. Reagent lot number recalled by manufacturer.   All other components within normal limits  CBG MONITORING, ED - Abnormal; Notable for the following components:   Glucose-Capillary 167 (*)    All other components within  normal limits    EKG None  Radiology No results found.  Procedures Procedures (including critical care time)  Medications Ordered in ED Medications - No data to display   Initial Impression / Assessment and Plan / ED Course  I have reviewed the triage vital signs and the nursing notes.  Pertinent labs & imaging results that were available during my care of the patient were reviewed by me and considered in my medical decision making (see chart for details).     Work-up here without any significant abnormalities.  Patient still testing positive for cocaine and THC.  Actually her blood sugars are elevated but pretty reasonable for her.  Electrolytes without significant abnormalities other than some mild hypokalemia.  Patient was given food here.  Patient's been sleeping.  Patient nontoxic no acute distress.  Patient stable for discharge home.  Final Clinical Impressions(s) / ED Diagnoses   Final diagnoses:  Fatigue, unspecified type  Polysubstance abuse ([place])  Hypokalemia  Hyperglycemia    ED Discharge Orders    None       Fredia Sorrow, MD 12/17/17 1422

## 2017-12-17 NOTE — ED Notes (Signed)
Pt ambulatory to restroom

## 2017-12-17 NOTE — ED Triage Notes (Signed)
Pt is homeless reported by EMS, no specific complaints, general malaise.

## 2017-12-17 NOTE — ED Notes (Addendum)
Upon entering pt room, pt asking for food and drink. Pt informed she will need to wait to see  Provider first. Pt on cell phone throughout assessment.

## 2017-12-17 NOTE — ED Notes (Signed)
Pt provided ginger ale, saltines, and graham crackers, as well as warm blanket.

## 2017-12-17 NOTE — ED Notes (Signed)
Informed pt that urine is needed for lab work.  Pt stated that she was not able.  Gave pt beverage.  Will try in 30 minutes

## 2017-12-17 NOTE — Discharge Instructions (Addendum)
Blood sugar actually today for you fairly well controlled.  Potassium still slightly low.  Information with foods high in potassium provided.  Rest of work-up without significant abnormalities.  Make an appointment to follow-up with your doctor.

## 2017-12-17 NOTE — ED Notes (Signed)
Pt stated that she wanted to see a psychiatrist because of depression.  Informed RN.

## 2017-12-21 ENCOUNTER — Emergency Department (HOSPITAL_COMMUNITY): Payer: Medicaid Other

## 2017-12-21 ENCOUNTER — Emergency Department (HOSPITAL_COMMUNITY)
Admission: EM | Admit: 2017-12-21 | Discharge: 2017-12-21 | Disposition: A | Discharge status: Home / Self Care | Payer: Medicaid Other | Attending: Emergency Medicine | Admitting: Emergency Medicine

## 2017-12-21 ENCOUNTER — Encounter (HOSPITAL_COMMUNITY): Payer: Self-pay | Admitting: Emergency Medicine

## 2017-12-21 ENCOUNTER — Other Ambulatory Visit: Payer: Self-pay

## 2017-12-21 DIAGNOSIS — F17210 Nicotine dependence, cigarettes, uncomplicated: Secondary | ICD-10-CM | POA: Insufficient documentation

## 2017-12-21 DIAGNOSIS — Y939 Activity, unspecified: Secondary | ICD-10-CM | POA: Diagnosis not present

## 2017-12-21 DIAGNOSIS — Y998 Other external cause status: Secondary | ICD-10-CM | POA: Insufficient documentation

## 2017-12-21 DIAGNOSIS — S022XXA Fracture of nasal bones, initial encounter for closed fracture: Secondary | ICD-10-CM | POA: Insufficient documentation

## 2017-12-21 DIAGNOSIS — S0990XA Unspecified injury of head, initial encounter: Secondary | ICD-10-CM | POA: Diagnosis present

## 2017-12-21 DIAGNOSIS — Z794 Long term (current) use of insulin: Secondary | ICD-10-CM | POA: Diagnosis not present

## 2017-12-21 DIAGNOSIS — Z91040 Latex allergy status: Secondary | ICD-10-CM | POA: Insufficient documentation

## 2017-12-21 DIAGNOSIS — T07XXXA Unspecified multiple injuries, initial encounter: Secondary | ICD-10-CM

## 2017-12-21 DIAGNOSIS — E119 Type 2 diabetes mellitus without complications: Secondary | ICD-10-CM | POA: Insufficient documentation

## 2017-12-21 DIAGNOSIS — T148XXA Other injury of unspecified body region, initial encounter: Secondary | ICD-10-CM | POA: Diagnosis not present

## 2017-12-21 DIAGNOSIS — Z79899 Other long term (current) drug therapy: Secondary | ICD-10-CM | POA: Diagnosis not present

## 2017-12-21 DIAGNOSIS — I10 Essential (primary) hypertension: Secondary | ICD-10-CM | POA: Diagnosis not present

## 2017-12-21 DIAGNOSIS — Y929 Unspecified place or not applicable: Secondary | ICD-10-CM | POA: Diagnosis not present

## 2017-12-21 IMAGING — CT CT HEAD W/O CM
4 of 10 series · 15 of 47 positions shown, 17 images · non-contrast
Comparison: CT head and CT maxillofacial examinations [DATE]

CLINICAL DATA: Pain following trauma

EXAM:
CT HEAD WITHOUT CONTRAST
CT MAXILLOFACIAL WITHOUT CONTRAST
TECHNIQUE: Multidetector CT imaging of the head and maxillofacial structures
were performed using the standard protocol without intravenous
contrast. Multiplanar CT image reconstructions of the maxillofacial
structures were also generated.

[Series 7: maxilllofacial 2.0 hr40 3 · axial · 0.36mm/px · z∈[-194,-86]mm · 7 of 73 slices shown, 9 images]
[im 10/73  brain]
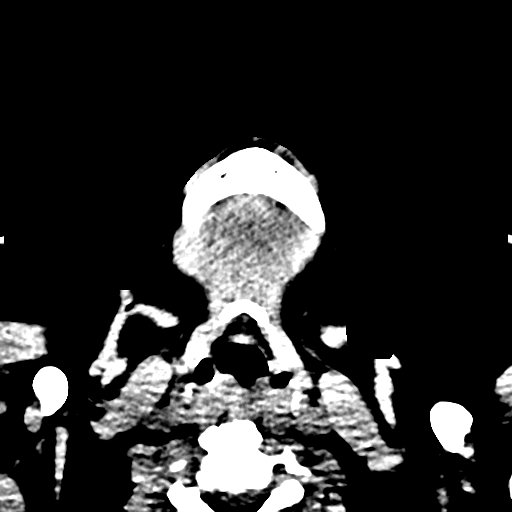
[im 10/73  bone]
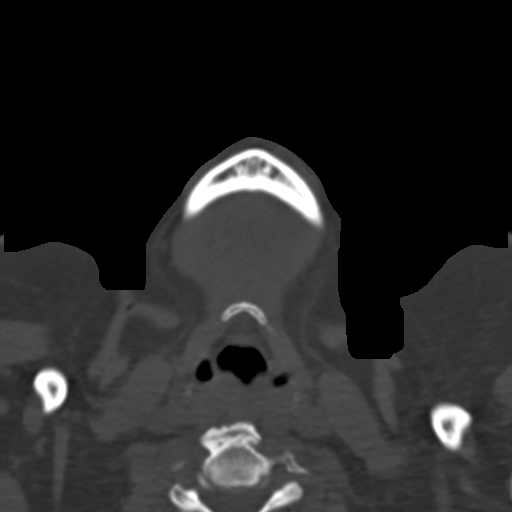
[im 19/73  brain]
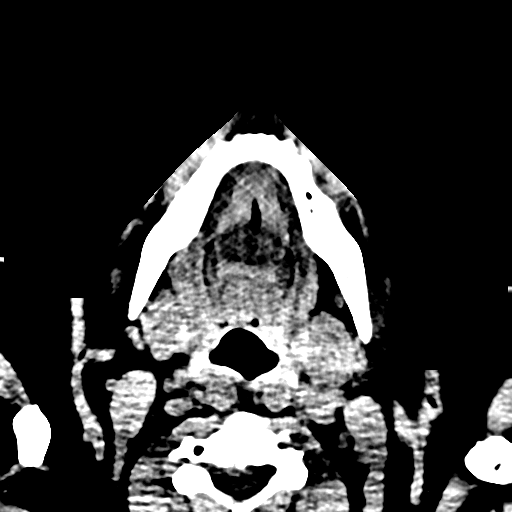
[im 28/73  brain]
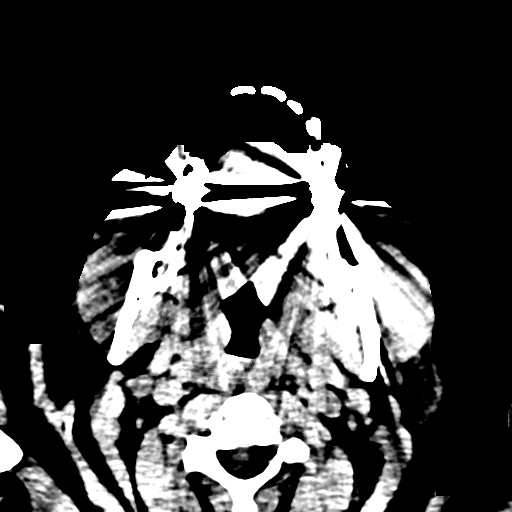
[im 37/73  brain]
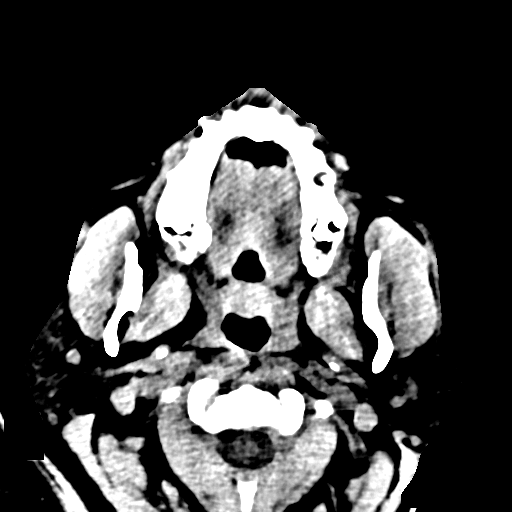
[im 46/73  brain]
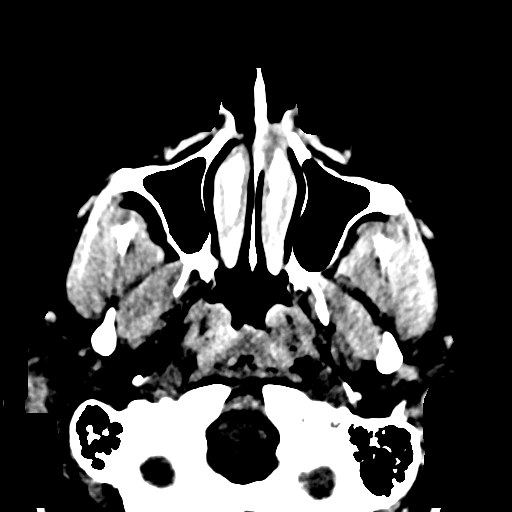
[im 46/73  bone]
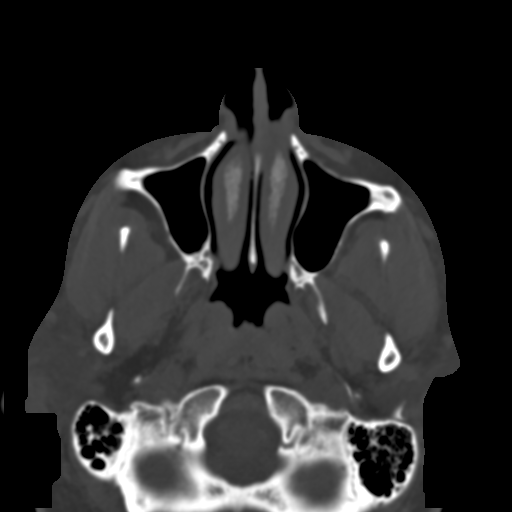
[im 55/73  brain]
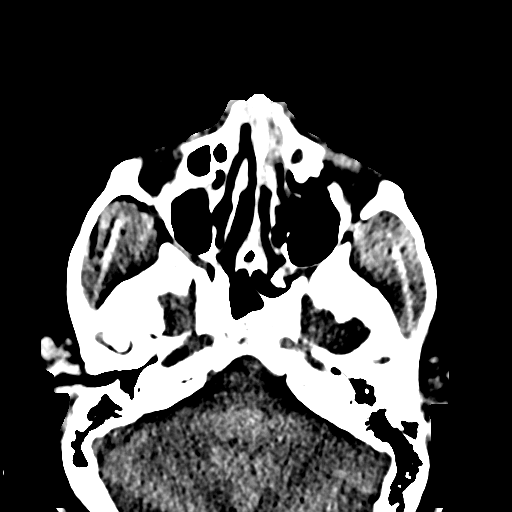
[im 64/73  brain]
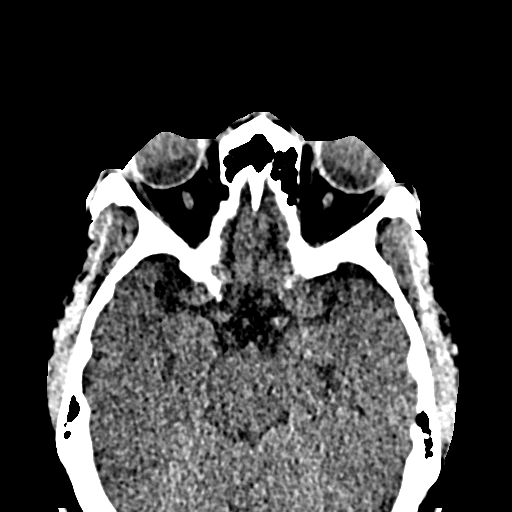

[Series 9: maxilllofacial 2.0 hr59 3 · axial · 0.36mm/px · z∈[-194,-122]mm · 5 of 73 slices shown]
[im 10/73  brain]
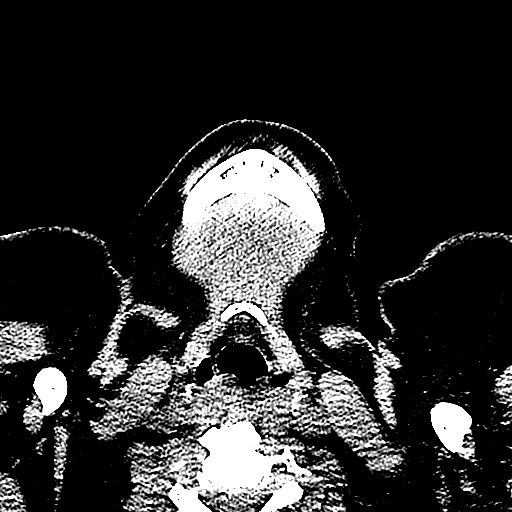
[im 19/73  brain]
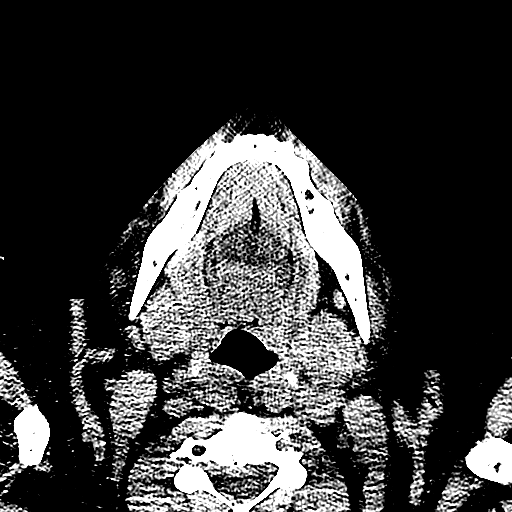
[im 28/73  brain]
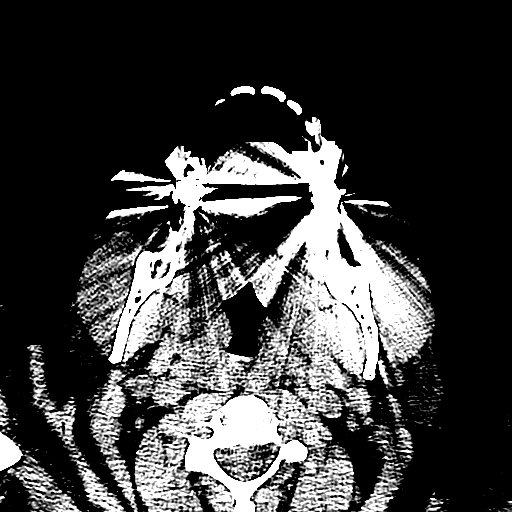
[im 37/73  brain]
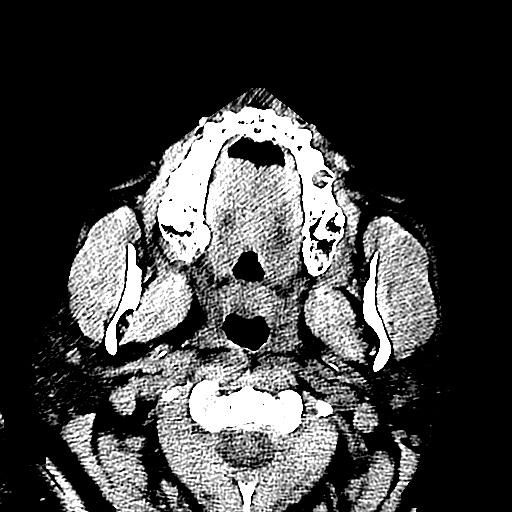
[im 46/73  brain]
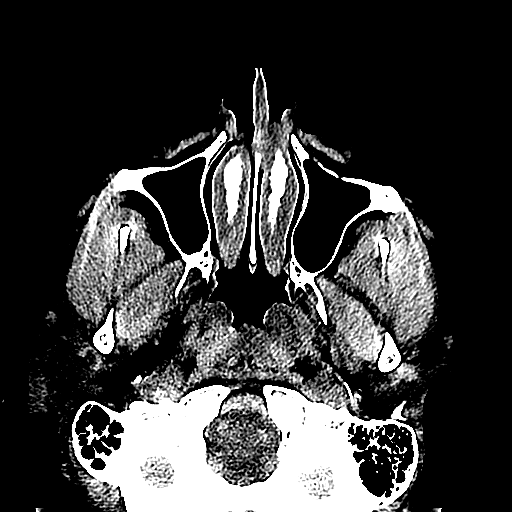

[Series 11: st cor · coronal · 0.28mm/px · 2 of 76 slices shown]
[im 26/76  brain]
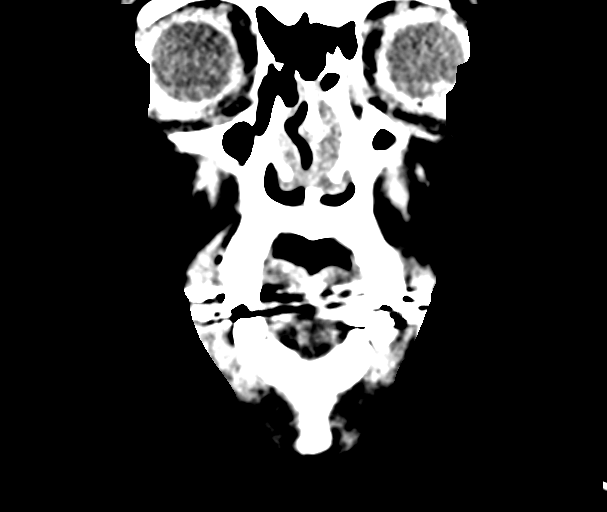
[im 51/76  brain]
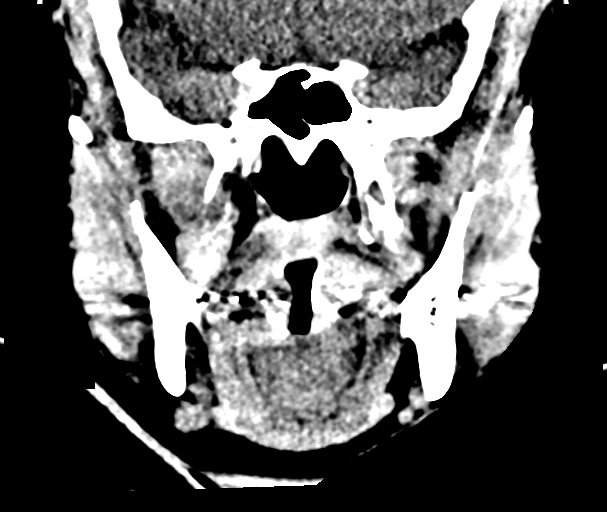

[Series 12: st sag · sagittal · 0.28mm/px · 1 of 76 slices shown]
[im 38/76  brain]
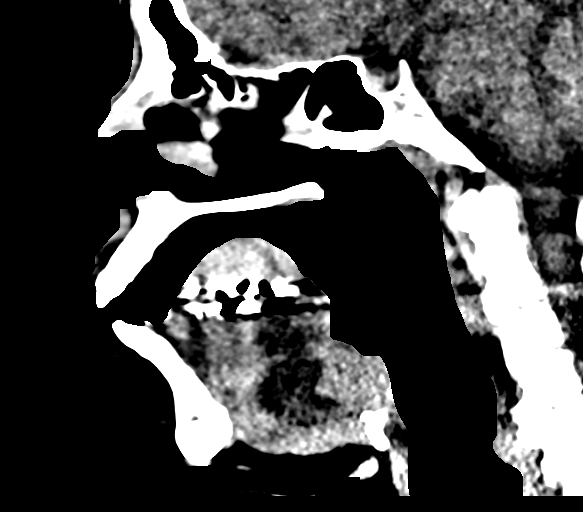

[15 of 47 positions shown; findings below may reference images not displayed]

FINDINGS: CT HEAD FINDINGS

Brain: Ventricles are upper normal in size for age with stable size
and configuration compared to prior study. Sulci appear normal.
There is no intracranial mass, hemorrhage, extra-axial fluid
collection, or midline shift. There is minimal small vessel disease
in the centra semiovale bilaterally. Elsewhere gray-white
compartments appear normal. No evident acute infarct.

Vascular: There is no hyperdense vessel. There is minimal
calcification in the carotid siphon regions.

Skull: The bony calvarium appears intact.

Other: Mastoid air cells are clear.

CT MAXILLOFACIAL FINDINGS

Osseous: There are age uncertain fractures of the mid and distal
left nasal bone regions. No other evident fracture. No dislocation.
No blastic or lytic bone lesions. There is a stable defect in the
right medial orbital wall which may represent residua of old trauma
or may be congenital.

Orbits: Orbits appear symmetric bilaterally. No intraorbital lesions
are evident. Note that there is a stable bony defect in the medial
right orbital wall. Extraocular muscles do not extend into this
defect.

Sinuses: There is a small retention cyst in the inferior left
maxillary antrum. There is a small retention cyst along the medial
right maxillary antrum. There is mucosal thickening in multiple
ethmoid air cells bilaterally. There is mucosal thickening in the
anterior left sphenoid sinus. Other paranasal sinuses are clear.
There is no air-fluid level. No bony destruction or expansion.

There is obstruction of the ostiomeatal unit complex on the right
due to a retention cyst. The ostiomeatal unit complex on the left is
narrowed but patent. There is probable polypoid change in the nasal
cavity with obstruction of most of the nasal cavity on the left and
partial obstruction on the right. Nasal septum is midline.

Soft tissues: There is soft tissue swelling over the nose and
anterior left mid face. No abscess or well-defined hematomas.

Salivary glands appear symmetric bilaterally. No evident adenopathy.
Tongue and tongue base regions appear normal. Visualized pharynx
appears normal.

There are foci of degenerative change in the visualized cervical
spine.
IMPRESSION: CT head: Minimal periventricular small vessel disease. No mass or
hemorrhage. No extra-axial fluid collection. Gray-white compartments
appear normal.

Minimal vascular calcification noted.

CT maxillofacial:

1. Fractures of the mid and anterior left nasal bones of uncertain
age. No other evident fracture. No dislocation.

2. Stable bony defect in the medial right orbital wall, a finding
that may be congenital or represent residua of old trauma.

3. Multifocal paranasal sinus disease. Obstruction of the
ostiomeatal unit complex on the right. Narrowing of the ostiomeatal
unit complex on the left. Polypoid change in the nasal cavities with
obstruction on the left and partial obstruction on the right. There
is a degree of superimposed hemorrhage within the nasal cavity
regions cannot be excluded.

4.  Soft tissue swelling over the nose and left mid face.

## 2017-12-21 MED ORDER — IBUPROFEN 600 MG PO TABS: 600.0000 mg | ORAL_TABLET | Freq: Four times a day (QID) | ORAL | 0 refills | Status: DC | PRN

## 2017-12-21 NOTE — ED Notes (Signed)
ED Provider at bedside. 
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
## 2017-12-21 NOTE — Discharge Instructions (Signed)
Return if any problems.

## 2017-12-21 NOTE — ED Notes (Signed)
Patient transported to CT 
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
## 2017-12-24 ENCOUNTER — Emergency Department (HOSPITAL_COMMUNITY): Payer: Medicaid Other

## 2017-12-24 ENCOUNTER — Encounter (HOSPITAL_COMMUNITY): Payer: Self-pay | Admitting: Emergency Medicine

## 2017-12-24 ENCOUNTER — Other Ambulatory Visit: Payer: Self-pay

## 2017-12-24 ENCOUNTER — Emergency Department (HOSPITAL_COMMUNITY): Admission: EM | Admit: 2017-12-24 | Discharge: 2017-12-24 | Discharge status: Against Medical Advice | Payer: MEDICAID

## 2017-12-24 ENCOUNTER — Emergency Department (HOSPITAL_COMMUNITY)
Admission: EM | Admit: 2017-12-24 | Discharge: 2017-12-24 | Disposition: A | Discharge status: Home / Self Care | Payer: Medicaid Other | Attending: Emergency Medicine | Admitting: Emergency Medicine

## 2017-12-24 DIAGNOSIS — E119 Type 2 diabetes mellitus without complications: Secondary | ICD-10-CM | POA: Diagnosis not present

## 2017-12-24 DIAGNOSIS — Z794 Long term (current) use of insulin: Secondary | ICD-10-CM | POA: Insufficient documentation

## 2017-12-24 DIAGNOSIS — Z8781 Personal history of (healed) traumatic fracture: Secondary | ICD-10-CM

## 2017-12-24 DIAGNOSIS — Y939 Activity, unspecified: Secondary | ICD-10-CM | POA: Insufficient documentation

## 2017-12-24 DIAGNOSIS — I10 Essential (primary) hypertension: Secondary | ICD-10-CM | POA: Insufficient documentation

## 2017-12-24 DIAGNOSIS — R51 Headache: Secondary | ICD-10-CM

## 2017-12-24 DIAGNOSIS — Y999 Unspecified external cause status: Secondary | ICD-10-CM | POA: Insufficient documentation

## 2017-12-24 DIAGNOSIS — S022XXD Fracture of nasal bones, subsequent encounter for fracture with routine healing: Secondary | ICD-10-CM | POA: Insufficient documentation

## 2017-12-24 DIAGNOSIS — F17210 Nicotine dependence, cigarettes, uncomplicated: Secondary | ICD-10-CM | POA: Diagnosis not present

## 2017-12-24 DIAGNOSIS — S12041A Nondisplaced lateral mass fracture of first cervical vertebra, initial encounter for closed fracture: Secondary | ICD-10-CM | POA: Insufficient documentation

## 2017-12-24 DIAGNOSIS — Y929 Unspecified place or not applicable: Secondary | ICD-10-CM | POA: Diagnosis not present

## 2017-12-24 DIAGNOSIS — R519 Headache, unspecified: Secondary | ICD-10-CM

## 2017-12-24 HISTORY — DX: Personal history of (healed) traumatic fracture: Z87.81

## 2017-12-24 LAB — CBG MONITORING, ED: Glucose-Capillary: 183 mg/dL — ABNORMAL HIGH (ref 70–99)
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
## 2017-12-24 MED ORDER — SODIUM CHLORIDE 0.9 % IV BOLUS
500.0000 mL | Freq: Once | INTRAVENOUS | Status: AC
  Administered 2017-12-24: 500 mL via INTRAVENOUS

## 2017-12-24 MED ORDER — SALINE SPRAY 0.65 % NA SOLN: 1.0000 | NASAL | 0 refills | Status: DC | PRN

## 2017-12-24 MED ORDER — METOCLOPRAMIDE HCL 5 MG/ML IJ SOLN
10.0000 mg | Freq: Once | INTRAMUSCULAR | Status: AC
  Administered 2017-12-24: 10 mg via INTRAVENOUS
  Filled 2017-12-24: qty 2

## 2017-12-24 MED ORDER — KETOROLAC TROMETHAMINE 30 MG/ML IJ SOLN
15.0000 mg | Freq: Once | INTRAMUSCULAR | Status: AC
Start: 2017-12-24 — End: 2017-12-24
  Administered 2017-12-24: 15 mg via INTRAVENOUS
  Filled 2017-12-24: qty 1

## 2017-12-24 MED ORDER — METHOCARBAMOL 500 MG PO TABS: 500.0000 mg | ORAL_TABLET | Freq: Two times a day (BID) | ORAL | 0 refills | Status: DC

## 2017-12-24 MED ORDER — OXYCODONE-ACETAMINOPHEN 5-325 MG PO TABS: 1.0000 | ORAL_TABLET | Freq: Four times a day (QID) | ORAL | 0 refills | Status: DC | PRN

## 2017-12-24 NOTE — ED Notes (Signed)
Patient transported to X-ray 

## 2017-12-24 NOTE — ED Notes (Signed)
Pt called for triage with no response. 

## 2017-12-24 NOTE — ED Notes (Signed)
Pt discharged from ED; instructions provided and scripts given; Pt encouraged to return to ED if symptoms worsen and to f/u with PCP; Pt verbalized understanding of all instructions 
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
## 2017-12-24 NOTE — Discharge Instructions (Signed)
Medications: Percocet, Robaxin  Treatment: Continue taking ibuprofen as prescribed.  For breakthrough pain, take 1 Percocet every 6 hours.  Take Robaxin twice daily as needed for muscle pain or spasms.  Use ice to your face 3-4 times daily alternating 20 minutes on, 20 minutes off.  Use nasal saline as needed for nasal congestion.  Do not drink alcohol, drive, operate machinery or participate in any other potentially dangerous activities while taking opiate pain medication as it may make you sleepy. Do not take this medication with any other sedating medications, either prescription or over-the-counter. If you were prescribed Percocet or Vicodin, do not take these with acetaminophen (Tylenol) as it is already contained within these medications and overdose of Tylenol is dangerous.   This medication is an opiate (or narcotic) pain medication and can be habit forming.  Use it as little as possible to achieve adequate pain control.  Do not use or use it with extreme caution if you have a history of opiate abuse or dependence. This medication is intended for your use only - do not give any to anyone else and keep it in a secure place where nobody else, especially children, have access to it. It will also cause or worsen constipation, so you may want to consider taking an over-the-counter stool softener while you are taking this medication.  Follow-up: Please follow-up with the ear nose and throat doctor, Dr. Ezzard Standing, for further evaluation and treatment of your nasal bone fractures.  Please follow-up with Dr. Venetia Maxon, the neurosurgeon, in 10 to 14 days for recheck of your cervical bone fracture.  Please follow-up with your doctor for recheck  and management of your high blood pressure.  Continue taking her medications as prescribed.  Please return to the emergency department if you develop any new or worsening symptoms.

## 2017-12-24 NOTE — ED Notes (Signed)
Pt called for triage with no response x2 
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
## 2017-12-29 ENCOUNTER — Encounter (HOSPITAL_COMMUNITY): Payer: Self-pay | Admitting: Radiology

## 2017-12-29 ENCOUNTER — Emergency Department (HOSPITAL_COMMUNITY): Payer: Medicaid Other

## 2017-12-29 ENCOUNTER — Emergency Department (HOSPITAL_COMMUNITY)
Admission: EM | Admit: 2017-12-29 | Discharge: 2017-12-29 | Disposition: A | Discharge status: Home / Self Care | Payer: Medicaid Other | Attending: Emergency Medicine | Admitting: Emergency Medicine

## 2017-12-29 DIAGNOSIS — R52 Pain, unspecified: Secondary | ICD-10-CM | POA: Diagnosis not present

## 2017-12-29 DIAGNOSIS — Z794 Long term (current) use of insulin: Secondary | ICD-10-CM | POA: Diagnosis not present

## 2017-12-29 DIAGNOSIS — Z79899 Other long term (current) drug therapy: Secondary | ICD-10-CM | POA: Diagnosis not present

## 2017-12-29 DIAGNOSIS — I10 Essential (primary) hypertension: Secondary | ICD-10-CM | POA: Diagnosis not present

## 2017-12-29 DIAGNOSIS — R202 Paresthesia of skin: Secondary | ICD-10-CM | POA: Insufficient documentation

## 2017-12-29 DIAGNOSIS — E119 Type 2 diabetes mellitus without complications: Secondary | ICD-10-CM | POA: Diagnosis not present

## 2017-12-29 DIAGNOSIS — F17210 Nicotine dependence, cigarettes, uncomplicated: Secondary | ICD-10-CM | POA: Diagnosis not present

## 2017-12-29 DIAGNOSIS — M542 Cervicalgia: Secondary | ICD-10-CM | POA: Diagnosis present

## 2017-12-29 LAB — BASIC METABOLIC PANEL
Anion gap: 7 (ref 5–15)
BUN: 10 mg/dL (ref 6–20)
CHLORIDE: 104 mmol/L (ref 98–111)
CO2: 28 mmol/L (ref 22–32)
CREATININE: 0.77 mg/dL (ref 0.44–1.00)
Calcium: 9 mg/dL (ref 8.9–10.3)
GFR calc non Af Amer: >60 mL/min (ref >60)
Glucose, Bld: 225 mg/dL — ABNORMAL HIGH (ref 70–99)
Potassium: 3.5 mmol/L (ref 3.5–5.1)
Sodium: 139 mmol/L (ref 135–145)

## 2017-12-29 LAB — CBC WITH DIFFERENTIAL/PLATELET
BASOS PCT: 0 %
Basophils Absolute: 0 10*3/uL (ref 0.0–0.1)
EOS ABS: 0.1 10*3/uL (ref 0.0–0.7)
Eosinophils Relative: 2 %
HCT: 33.4 % — ABNORMAL LOW (ref 36.0–46.0)
HEMOGLOBIN: 10.4 g/dL — AB (ref 12.0–15.0)
Lymphocytes Relative: 36 %
Lymphs Abs: 1.9 10*3/uL (ref 0.7–4.0)
MCH: 26.1 pg (ref 26.0–34.0)
MCHC: 31.1 g/dL (ref 30.0–36.0)
MCV: 83.7 fL (ref 78.0–100.0)
MONOS PCT: 5 %
Monocytes Absolute: 0.3 10*3/uL (ref 0.1–1.0)
NEUTROS PCT: 57 %
Neutro Abs: 3 10*3/uL (ref 1.7–7.7)
Platelets: 308 10*3/uL (ref 150–400)
RBC: 3.99 MIL/uL (ref 3.87–5.11)
RDW: 15.6 % — ABNORMAL HIGH (ref 11.5–15.5)
WBC: 5.3 10*3/uL (ref 4.0–10.5)

## 2017-12-29 LAB — CBG MONITORING, ED: Glucose-Capillary: 196 mg/dL — ABNORMAL HIGH (ref 70–99)

## 2017-12-29 LAB — I-STAT BETA HCG BLOOD, ED (MC, WL, AP ONLY): I-stat hCG, quantitative: <5 m[IU]/mL (ref <5)
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
## 2017-12-29 MED ORDER — IBUPROFEN 200 MG PO TABS
600.0000 mg | ORAL_TABLET | Freq: Once | ORAL | Status: AC
  Administered 2017-12-29: 600 mg via ORAL
  Filled 2017-12-29: qty 3

## 2017-12-29 MED ORDER — ACETAMINOPHEN 325 MG PO TABS
650.0000 mg | ORAL_TABLET | Freq: Once | ORAL | Status: AC
  Administered 2017-12-29: 650 mg via ORAL
  Filled 2017-12-29: qty 2

## 2017-12-29 MED ORDER — MELOXICAM 7.5 MG PO TABS: 7.5000 mg | ORAL_TABLET | Freq: Every day | ORAL | 0 refills | Status: DC

## 2017-12-29 NOTE — Progress Notes (Addendum)
1:01pm- CSW has attempted to call social worker, [place], regarding open CPS case and has left two voicemails. CSW attempted to reach out to [facility] supervisor and left voicemail. CSW has also reached out to CPS direct hotline twice. CSW has informed PA of these efforts. CSW will continue to reach out and wait for call back.   CSW reached out to CPS direct hotline to file CPS report. CSW was informed that there is already an active CPS case for this family. CSW was redirected to social worker, [place] at [phone] regarding case, CSW left voicemail for return call. CSW awaiting return call at this time.  Archie Balboa, LCSWA  Clinical Social Work Department  [employer]  [phone]
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
## 2017-12-29 NOTE — ED Notes (Signed)
Patient refusing to sign signature pad.
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
## 2017-12-29 NOTE — ED Notes (Signed)
Pt woke when RN came to get blood work. Pt was angry that she needed to get stuck for labs. Pt yelled at this RN "do you even know what the fuck you are doing?" Labs were obtained. Pt rolled around in bed with no difficulty.

## 2017-12-29 NOTE — Discharge Instructions (Signed)
Today you were given a cervical (neck) collar to wear.  This is designed to keep you from moving your head and help protect your neck.  Please do not take this collar off.  You have been given 2 sets of pads for the collar today.  Please wear this when you shower, and after the shower and lay down flat in the bed remove your collar without moving her head/neck (I suggest getting someone to help you) and change out the wet pads for the clean dry set and replace your collar.  If you take it off before following up with neurosurgery or your PCP you may have complications including, but not limited to severe disability or death.     I have given you a prescription for Mobic (meloxicam) today.  Mobic is a NSAID medication and you should not take it with other NSAIDs.  Examples of other NSAIDS include motrin, ibuprofen, aleve, naproxen, and Voltaren.  Please monitor your bowel movements for dark, tarry, sticky stools. If you have any bowel movements like this you need to stop taking mobic and call your doctor as this may represent a stomach ulcer from taking NSAIDS.

## 2017-12-29 NOTE — ED Notes (Signed)
Gave patients children sandwiches and drinks.
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
## 2017-12-29 NOTE — ED Notes (Signed)
Bed: WA18 Expected date:  Expected time:  Means of arrival:  Comments: 

## 2017-12-29 NOTE — ED Notes (Addendum)
Patient refusing blood pressure
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
## 2017-12-29 NOTE — ED Notes (Signed)
Patient refusing blood pressure at this time.

## 2017-12-29 NOTE — ED Notes (Signed)
PA aware patient in pain.

## 2018-01-01 ENCOUNTER — Emergency Department (HOSPITAL_COMMUNITY)
Admission: EM | Admit: 2018-01-01 | Discharge: 2018-01-01 | Disposition: A | Discharge status: Home / Self Care | Payer: Medicaid Other | Attending: Emergency Medicine | Admitting: Emergency Medicine

## 2018-01-01 ENCOUNTER — Encounter (HOSPITAL_COMMUNITY): Payer: Self-pay | Admitting: Emergency Medicine

## 2018-01-01 DIAGNOSIS — Z5321 Procedure and treatment not carried out due to patient leaving prior to being seen by health care provider: Secondary | ICD-10-CM | POA: Diagnosis not present

## 2018-01-01 DIAGNOSIS — M549 Dorsalgia, unspecified: Secondary | ICD-10-CM | POA: Insufficient documentation

## 2018-01-01 NOTE — ED Notes (Signed)
Pt did not respond when called for vitals 1X

## 2018-01-01 NOTE — ED Triage Notes (Signed)
Pt reports ongoing back pain and neck pain, chronic in nature. Seen at [facility] earlier this week for same. No meds at home, noncompliant with DM and HTN meds for months. No bowel or bladder incontinence, some tingling but no numbness. "Just pain" per patient.

## 2018-01-01 NOTE — ED Notes (Signed)
Called Pt. for room with no answer.

## 2018-01-01 NOTE — ED Notes (Signed)
Called pt to room. No answer.

## 2018-01-18 ENCOUNTER — Emergency Department (HOSPITAL_COMMUNITY)
Admission: EM | Admit: 2018-01-18 | Discharge: 2018-01-19 | Disposition: A | Discharge status: Home / Self Care | Payer: Self-pay | Attending: Emergency Medicine | Admitting: Emergency Medicine

## 2018-01-18 ENCOUNTER — Encounter (HOSPITAL_COMMUNITY): Payer: Self-pay | Admitting: Emergency Medicine

## 2018-01-18 DIAGNOSIS — Z79899 Other long term (current) drug therapy: Secondary | ICD-10-CM | POA: Insufficient documentation

## 2018-01-18 DIAGNOSIS — Z794 Long term (current) use of insulin: Secondary | ICD-10-CM | POA: Insufficient documentation

## 2018-01-18 DIAGNOSIS — M542 Cervicalgia: Secondary | ICD-10-CM

## 2018-01-18 DIAGNOSIS — S129XXD Fracture of neck, unspecified, subsequent encounter: Secondary | ICD-10-CM | POA: Insufficient documentation

## 2018-01-18 DIAGNOSIS — E119 Type 2 diabetes mellitus without complications: Secondary | ICD-10-CM | POA: Insufficient documentation

## 2018-01-18 DIAGNOSIS — X58XXXD Exposure to other specified factors, subsequent encounter: Secondary | ICD-10-CM | POA: Insufficient documentation

## 2018-01-18 DIAGNOSIS — I10 Essential (primary) hypertension: Secondary | ICD-10-CM | POA: Insufficient documentation

## 2018-01-18 DIAGNOSIS — F17210 Nicotine dependence, cigarettes, uncomplicated: Secondary | ICD-10-CM | POA: Insufficient documentation
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
## 2018-01-19 ENCOUNTER — Encounter (HOSPITAL_COMMUNITY): Payer: Self-pay

## 2018-01-19 ENCOUNTER — Other Ambulatory Visit: Payer: Self-pay

## 2018-01-19 ENCOUNTER — Ambulatory Visit (HOSPITAL_COMMUNITY)
Admission: EM | Admit: 2018-01-19 | Discharge: 2018-01-19 | Disposition: A | Discharge status: Home / Self Care | Payer: Self-pay | Attending: Family Medicine | Admitting: Family Medicine

## 2018-01-19 DIAGNOSIS — M542 Cervicalgia: Secondary | ICD-10-CM

## 2018-01-19 MED ORDER — OXYCODONE-ACETAMINOPHEN 5-325 MG PO TABS
1.0000 | ORAL_TABLET | Freq: Once | ORAL | Status: AC
  Administered 2018-01-19: 1 via ORAL
  Filled 2018-01-19: qty 1
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
## 2018-01-19 NOTE — Congregational Nurse Program (Signed)
Congregational Nurse Program Note  Date of Encounter: 01/19/2018  Past Medical History: Past Medical History:  Diagnosis Date  . Bipolar 1 disorder ([facility])   . 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 Hypertension     Encounter Details: CNP Questionnaire - 01/19/18 1720      Questionnaire   Patient Status  Not Applicable    Race  Black or African American    Location Patient Served At  Not Applicable    Insurance  Medicaid    Uninsured  Uninsured (NEW 1x/quarter)    Food  No food insecurities    Housing/Utilities  No permanent housing    Transportation  No transportation needs    Interpersonal Safety  Yes, feel physically and emotionally safe where you currently live    Medication  Yes, have medication insecurities    Medical Provider  No    Referrals  Primary Care Provider/Clinic;Medicaid    ED Visit Averted  Not Applicable    Life-Saving Intervention Made  Not Applicable     Initial visit to see nurse this pm ,states she was in the Ed last night. States she is ina lot of pain on a scale 0-10 states he neck pain is a 10. Wasn't given any pain medication she states but was referred to a neurosurgeon that she hasn't been able to get an appointment with . Nurse called the [facility] to get an appointment left clients name and number r for new patient coordinator to call client and nurse to schedule appointment. Client to return to see me tomorrow if she hasn't heard from the clinic . Clinic states she is a diabetic and has high blood  pressure. Has been off her blood pressure med's since April ,needs CP appointment ,referral to  [facility] to get appointment to get her back on her blood pressure med's. Client states her medicaid is only good for Mental health visits.  Client is a smoker ,smokes 4-6 per day ,counseled ,encouraged to decrease them at least 1 per month until she can stop. Client has a smokers cough. States she  takes her diabetes medications but doesn't always check her blood sugars. Visit was interrupted by another worker ,will stop by tomorrow to follow up on referral and appointments and recheck blood pressure.
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
## 2018-01-19 NOTE — Discharge Instructions (Addendum)
Continue to wear your cervical collar.  Do not remove it for any reason.  Call the office of [facility] to schedule close follow-up.  Continue use of anti-inflammatories for pain control.  You will need to follow-up with a specialist to be prescribed any narcotic pain medication.
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
## 2018-01-19 NOTE — Discharge Instructions (Addendum)
Follow up with neurosurgery

## 2018-01-19 NOTE — ED Notes (Signed)
Pt ambulated from lobby to stretcher unassisted, upon assessment pt

## 2018-01-19 NOTE — ED Notes (Signed)
Pt ambulated out of treatment area with normal gait yelling at staff on her way

## 2018-01-19 NOTE — ED Notes (Signed)
Called for Pt to recheck vitals. No answer x2.
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
## 2018-01-21 ENCOUNTER — Emergency Department (HOSPITAL_COMMUNITY): Payer: Self-pay

## 2018-01-21 ENCOUNTER — Other Ambulatory Visit: Payer: Self-pay

## 2018-01-21 ENCOUNTER — Emergency Department (HOSPITAL_COMMUNITY)
Admission: EM | Admit: 2018-01-21 | Discharge: 2018-01-22 | Discharge status: Against Medical Advice | Payer: Self-pay | Attending: Emergency Medicine | Admitting: Emergency Medicine

## 2018-01-21 ENCOUNTER — Encounter (HOSPITAL_COMMUNITY): Payer: Self-pay | Admitting: Emergency Medicine

## 2018-01-21 DIAGNOSIS — Z5321 Procedure and treatment not carried out due to patient leaving prior to being seen by health care provider: Secondary | ICD-10-CM | POA: Insufficient documentation

## 2018-01-21 DIAGNOSIS — R079 Chest pain, unspecified: Secondary | ICD-10-CM | POA: Insufficient documentation

## 2018-01-21 LAB — I-STAT BETA HCG BLOOD, ED (MC, WL, AP ONLY)

## 2018-01-21 LAB — I-STAT TROPONIN, ED: Troponin i, poc: 0 ng/mL (ref 0.00–0.08)
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
## 2018-01-21 NOTE — ED Triage Notes (Signed)
Pt BIB EMS. Complaints of chest pain. Recent C1 fx. Pt was not wearing C collar on arrival of EMS but did put it on.  Pt had 10/10 chest pain at 2000 that has since completely resolved en route.  324 ASA given. Pt was seen and treated here on 8/5 for C1 fx.

## 2018-01-22 LAB — BASIC METABOLIC PANEL
Anion gap: 8 (ref 5–15)
BUN: 17 mg/dL (ref 6–20)
CHLORIDE: 107 mmol/L (ref 98–111)
CO2: 23 mmol/L (ref 22–32)
Calcium: 8.6 mg/dL — ABNORMAL LOW (ref 8.9–10.3)
Creatinine, Ser: 1.04 mg/dL — ABNORMAL HIGH (ref 0.44–1.00)
GFR calc Af Amer: >60 mL/min (ref >60)
Glucose, Bld: 98 mg/dL (ref 70–99)
POTASSIUM: 3.9 mmol/L (ref 3.5–5.1)
Sodium: 138 mmol/L (ref 135–145)

## 2018-01-22 LAB — CBC
HEMATOCRIT: 33.9 % — AB (ref 36.0–46.0)
Hemoglobin: 10.2 g/dL — ABNORMAL LOW (ref 12.0–15.0)
MCH: 25.8 pg — ABNORMAL LOW (ref 26.0–34.0)
MCHC: 30.1 g/dL (ref 30.0–36.0)
MCV: 85.6 fL (ref 78.0–100.0)
Platelets: 306 10*3/uL (ref 150–400)
RBC: 3.96 MIL/uL (ref 3.87–5.11)
RDW: 16.7 % — ABNORMAL HIGH (ref 11.5–15.5)
WBC: 5.6 10*3/uL (ref 4.0–10.5)

## 2018-01-22 NOTE — ED Notes (Signed)
Sue Lush, RN attempted to call pt back with no answer

## 2018-01-25 NOTE — ED Notes (Signed)
Follow up call made  No answer  01/25/18  0939  s Nycholas Rayner rn

## 2018-01-27 NOTE — Congregational Nurse Program (Signed)
See nursing note written 01-27-18  Under case management !!

## 2018-01-27 NOTE — Clinical Documentation Improvement (Signed)
Client in to see nurse to have her blood pressure checked and counseling of diabetes. She states her blood sugars have averaged 118 mg,120  Mg, watches what she  is eating and has stopped drinking lots of sodas ,  Recognizes what foods she should avoid and has been given a list of foods to avoid. States her medicaid is only for Mental Health and that she cant make an ENT or neurosurgery appointment .Client  Was referred to [facility] to re establish PCP and get back on her blood pressure medications as client 's  blood pressure is very high. Counseled regarding blood pressure ,stroke and heart attack . States she was seen in ED for her high blood pressure again last week and still has no script for blood pressure medication . Counseled regarding smoking and high blood pressure To recheck tomorrow and see if client follows through with [facility] appointment.

## 2018-02-02 DIAGNOSIS — E0800 Diabetes mellitus due to underlying condition with hyperosmolarity without nonketotic hyperglycemic-hyperosmolar coma (NKHHC): Secondary | ICD-10-CM

## 2018-02-02 NOTE — Congregational Nurse Program (Signed)
Congregational Nurse Program Note  Date of Encounter: 02/02/2018  Past Medical History: Past Medical History:  Diagnosis Date  . Bipolar 1 disorder (HCC)   . 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 Hypertension     Encounter Details: CNP Questionnaire - 02/02/18 1748      Questionnaire   Patient Status  Not Applicable    Race  Black or African American    Location Patient Served At  Not Applicable    Insurance  Not Applicable    Uninsured  Uninsured (NEW 1x/quarter)    Food  No food insecurities    Housing/Utilities  No permanent housing    Transportation  No transportation needs;Provided transportation assistance (bus pass, taxi voucher, etc.);Within past 12 months, lack of transportation negatively impacted life    Interpersonal Safety  Yes, feel physically and emotionally safe where you currently live    Medication  Yes, have medication insecurities    Medical Provider  No    Referrals  Primary Care Provider/Clinic;Behavioral/Mental Health Provider    ED Visit Averted  Not Applicable    Life-Saving Intervention Made  Not Applicable     client in today stating she hasn't followed through with referral to [facility] because she didn't feel good and was in pain States she has transportation problems ,nurse gave client bus tickets to get to the [facility] for her and children. Client needs to also address issues with school placement and hasn't followed through on those things. Nurse tried to encourage and emphasized how important taking care of her blood pressure and getting situated with a PCP was to her general health. States she stopped taking her medication for her diabetes ;last week and has taken only the metformin several times ,states she feels like she doesn't need it !!! Blood sugar today at 4:07 pm was 99 mg . Last meal was at 12 noon and only had a hot dog she states! Another referral to see NP at  [facility] made and a note sent  To NP  regarding client.  To follow up on tomorrow to see if client follows through.

## 2018-02-16 NOTE — Congregational Nurse Program (Signed)
Congregational Nurse Program Note  Date of Encounter: 02/03/2018  Past Medical History: Past Medical History:  Diagnosis Date  . Bipolar 1 disorder (HCC)   . 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 Hypertension     Encounter Details:  Client was seen at  [facility] today and given her prescriptions and encouraged to take her medications and go back on her diabetes medications. Client called the health dept to fill scripts and counseled regarding the importance of taking her medications .will monitor her blood pressure and blood sugars . Commended  Client on her follow through.

## 2018-02-23 DIAGNOSIS — E0800 Diabetes mellitus due to underlying condition with hyperosmolarity without nonketotic hyperglycemic-hyperosmolar coma (NKHHC): Secondary | ICD-10-CM

## 2018-02-23 LAB — GLUCOSE, POCT (MANUAL RESULT ENTRY)

## 2018-02-23 NOTE — Congregational Nurse Program (Signed)
Kaitlyn Gray in  Today states she is just stopping by after dinner. Things going okay . States she has been taking her blood pressure medication and most of the time taking her metformin has missed taking her insulin like today . When ask why she didn't take her insulin she states she got busy with the children and never took it. Hasn't  thad any lancets to check blood sugar  But feels it isn't up ,she states . Client just had dinner 40 minutes ago but we will check blood sugar understanding she has just eaten . Counseled regarding the need to check her blood sugar in order for [place] to monitor her medication needs better. Client also had cigarette in her hand. Blood pressure was good and blood sugar also 117/78 ,128 mg blood sugar @ 6 pm after dinner meal .Client has gained some weight ,today 220 lbs States at night she has problems holding her urine ,given adult pampers and draw pads .  Will monitor blood sugar and blood pressure. To return to see NP at  [facility] 03-11-18 she states.

## 2018-02-24 NOTE — Congregational Nurse Program (Signed)
Client in for her blood pressure and blood sugar check. Client not taking her insulin states ,I slept today ,took a muscle relaxant and just slept ,states her hemoglobin is low .Wanted to give plasma and was told her hemoglobin was too low. Counseled regarding iron rich foods and gave her a list . Checked her blood pressure ,good range and blood sugar 114 mg before dinner ,last meal was at 11 am . States she has not taken any insulin today ask why made an excuse . Client isn't taking insulin as prescribed  But blood sugars are fairly good.  Will need PCP to look at amount of medication and blood sugar averages . Will send a note to provider. Not sure what clients A!-C was and how her blood work looks but will to consider all. Counseled client regarding blood sugars and the importance of checking in am . Client is somewhat non compliant with following through. Will continue to monitor and encourage to keep appointments. Client not ready to stop smoking

## 2018-03-14 ENCOUNTER — Emergency Department (HOSPITAL_COMMUNITY)
Admission: EM | Admit: 2018-03-14 | Discharge: 2018-03-15 | Disposition: A | Discharge status: Home / Self Care | Payer: Medicaid Other | Attending: Emergency Medicine | Admitting: Emergency Medicine

## 2018-03-14 ENCOUNTER — Other Ambulatory Visit: Payer: Self-pay

## 2018-03-14 ENCOUNTER — Emergency Department (HOSPITAL_COMMUNITY): Payer: Medicaid Other

## 2018-03-14 ENCOUNTER — Encounter (HOSPITAL_COMMUNITY): Payer: Self-pay

## 2018-03-14 DIAGNOSIS — Z5321 Procedure and treatment not carried out due to patient leaving prior to being seen by health care provider: Secondary | ICD-10-CM | POA: Diagnosis not present

## 2018-03-14 DIAGNOSIS — F419 Anxiety disorder, unspecified: Secondary | ICD-10-CM | POA: Insufficient documentation

## 2018-03-14 DIAGNOSIS — R079 Chest pain, unspecified: Secondary | ICD-10-CM | POA: Insufficient documentation

## 2018-03-14 LAB — CBC
HEMATOCRIT: 35.2 % — AB (ref 36.0–46.0)
HEMOGLOBIN: 10.6 g/dL — AB (ref 12.0–15.0)
MCH: 25.2 pg — AB (ref 26.0–34.0)
MCHC: 30.1 g/dL (ref 30.0–36.0)
MCV: 83.6 fL (ref 78.0–100.0)
PLATELETS: 264 10*3/uL (ref 150–400)
RBC: 4.21 MIL/uL (ref 3.87–5.11)
RDW: 16.2 % — ABNORMAL HIGH (ref 11.5–15.5)
WBC: 6.5 10*3/uL (ref 4.0–10.5)

## 2018-03-14 LAB — BASIC METABOLIC PANEL
ANION GAP: 9 (ref 5–15)
BUN: 10 mg/dL (ref 6–20)
CALCIUM: 9.1 mg/dL (ref 8.9–10.3)
CO2: 24 mmol/L (ref 22–32)
Chloride: 105 mmol/L (ref 98–111)
Creatinine, Ser: 0.98 mg/dL (ref 0.44–1.00)
Glucose, Bld: 143 mg/dL — ABNORMAL HIGH (ref 70–99)
Potassium: 3.2 mmol/L — ABNORMAL LOW (ref 3.5–5.1)
SODIUM: 138 mmol/L (ref 135–145)

## 2018-03-14 LAB — I-STAT TROPONIN, ED: Troponin i, poc: 0 ng/mL (ref 0.00–0.08)

## 2018-03-14 LAB — I-STAT BETA HCG BLOOD, ED (MC, WL, AP ONLY): I-stat hCG, quantitative: <5 m[IU]/mL (ref <5)

## 2018-03-14 NOTE — ED Notes (Signed)
Pt outside with young daughter. Young son remains in ED waiting room.

## 2018-03-14 NOTE — ED Notes (Addendum)
Pt refused transport to xray. This EMT was informed by the transport tech.

## 2018-03-14 NOTE — ED Triage Notes (Signed)
[facility] from [facility] after complaining of chest pain and anxiety.  Given 324 ASA by EMS. 18ga LAC. No N/V. CBG 145. Hx of HTN 159/110 for EMS.

## 2018-03-15 NOTE — ED Notes (Signed)
EMT notified this RN that patient told staff she was leaving.  EMT went to get materials to take out patient's IV, in that time patient left.  Unknown if IV remains. Charge RN aware.  This RN called the phone numbers listed in the Snapshot, no answers to all.

## 2018-03-16 NOTE — Congregational Nurse Program (Signed)
Congregational Nurse Program Note  Date of Encounter: 03/03/2018  Past Medical History: Past Medical History:  Diagnosis Date  . Bipolar 1 disorder (HCC)   . 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 Hypertension     Encounter Details: CNP Questionnaire - 03/16/18 1921      Questionnaire   Patient Status  Not Applicable    Race  Black or African American    Location Patient Served At  Not Applicable    Insurance  Not Applicable    Uninsured  Uninsured (Subsequent visits/quarter)    Food  No food insecurities    Housing/Utilities  No permanent housing    Transportation  Yes, need transportation assistance;Within past 12 months, lack of transportation negatively impacted life    Interpersonal Safety  Yes, feel physically and emotionally safe where you currently live    Medication  Yes, have medication insecurities    Medical Provider  Yes    Referrals  Primary Care Provider/Clinic;Area Agency    ED Visit Averted  Not Applicable    Life-Saving Intervention Made  Not Applicable     Brief  Encounter client stopped nurse in hallway states she thinks she felt a lump in her breast . Nurse ask that she attend [facility] sponsored by [employer] on 03-06-18 at  [employer] as there would be an MD there doing breast exams and we could have her examined and then take the next steps if there was a need. She agreed ,undeestands where to come ,flier placed in clients box .Will look for client there and follow up

## 2018-03-24 ENCOUNTER — Encounter (HOSPITAL_COMMUNITY): Payer: Self-pay | Admitting: Emergency Medicine

## 2018-03-24 ENCOUNTER — Emergency Department (HOSPITAL_COMMUNITY): Payer: Medicaid Other

## 2018-03-24 ENCOUNTER — Other Ambulatory Visit: Payer: Self-pay

## 2018-03-24 ENCOUNTER — Emergency Department (HOSPITAL_COMMUNITY)
Admission: EM | Admit: 2018-03-24 | Discharge: 2018-03-24 | Disposition: A | Discharge status: Home / Self Care | Payer: Medicaid Other | Attending: Emergency Medicine | Admitting: Emergency Medicine

## 2018-03-24 DIAGNOSIS — R079 Chest pain, unspecified: Secondary | ICD-10-CM | POA: Diagnosis present

## 2018-03-24 DIAGNOSIS — I10 Essential (primary) hypertension: Secondary | ICD-10-CM | POA: Insufficient documentation

## 2018-03-24 DIAGNOSIS — E119 Type 2 diabetes mellitus without complications: Secondary | ICD-10-CM | POA: Diagnosis not present

## 2018-03-24 DIAGNOSIS — Z91040 Latex allergy status: Secondary | ICD-10-CM | POA: Insufficient documentation

## 2018-03-24 DIAGNOSIS — Z79899 Other long term (current) drug therapy: Secondary | ICD-10-CM | POA: Insufficient documentation

## 2018-03-24 DIAGNOSIS — J069 Acute upper respiratory infection, unspecified: Secondary | ICD-10-CM | POA: Diagnosis not present

## 2018-03-24 DIAGNOSIS — Z794 Long term (current) use of insulin: Secondary | ICD-10-CM | POA: Diagnosis not present

## 2018-03-24 DIAGNOSIS — F17210 Nicotine dependence, cigarettes, uncomplicated: Secondary | ICD-10-CM | POA: Diagnosis not present

## 2018-03-24 LAB — CBC
HCT: 36.7 % (ref 36.0–46.0)
Hemoglobin: 10.8 g/dL — ABNORMAL LOW (ref 12.0–15.0)
MCH: 24.7 pg — AB (ref 26.0–34.0)
MCHC: 29.4 g/dL — ABNORMAL LOW (ref 30.0–36.0)
MCV: 83.8 fL (ref 80.0–100.0)
Platelets: 347 10*3/uL (ref 150–400)
RBC: 4.38 MIL/uL (ref 3.87–5.11)
RDW: 16 % — AB (ref 11.5–15.5)
WBC: 6.3 10*3/uL (ref 4.0–10.5)
nRBC: 0 % (ref 0.0–0.2)

## 2018-03-24 LAB — BASIC METABOLIC PANEL
Anion gap: 9 (ref 5–15)
BUN: 12 mg/dL (ref 6–20)
CO2: 22 mmol/L (ref 22–32)
CREATININE: 1.09 mg/dL — AB (ref 0.44–1.00)
Calcium: 9.3 mg/dL (ref 8.9–10.3)
Chloride: 105 mmol/L (ref 98–111)
GLUCOSE: 204 mg/dL — AB (ref 70–99)
Potassium: 4 mmol/L (ref 3.5–5.1)
Sodium: 136 mmol/L (ref 135–145)

## 2018-03-24 LAB — D-DIMER, QUANTITATIVE: D-Dimer, Quant: 0.73 ug/mL-FEU — ABNORMAL HIGH (ref 0.00–0.50)

## 2018-03-24 LAB — I-STAT TROPONIN, ED
Troponin i, poc: 0 ng/mL (ref 0.00–0.08)
Troponin i, poc: 0 ng/mL (ref 0.00–0.08)

## 2018-03-24 LAB — I-STAT BETA HCG BLOOD, ED (MC, WL, AP ONLY): I-stat hCG, quantitative: <5 m[IU]/mL (ref <5)

## 2018-03-24 IMAGING — CR DG CHEST 2V
2 series · 2 of 2 positions shown · non-contrast
Comparison: [DATE]

CLINICAL DATA: Chest pain, congestion, shortness of breath, and
cough for 1 day.

EXAM:
CHEST - 2 VIEW

[chest pa]
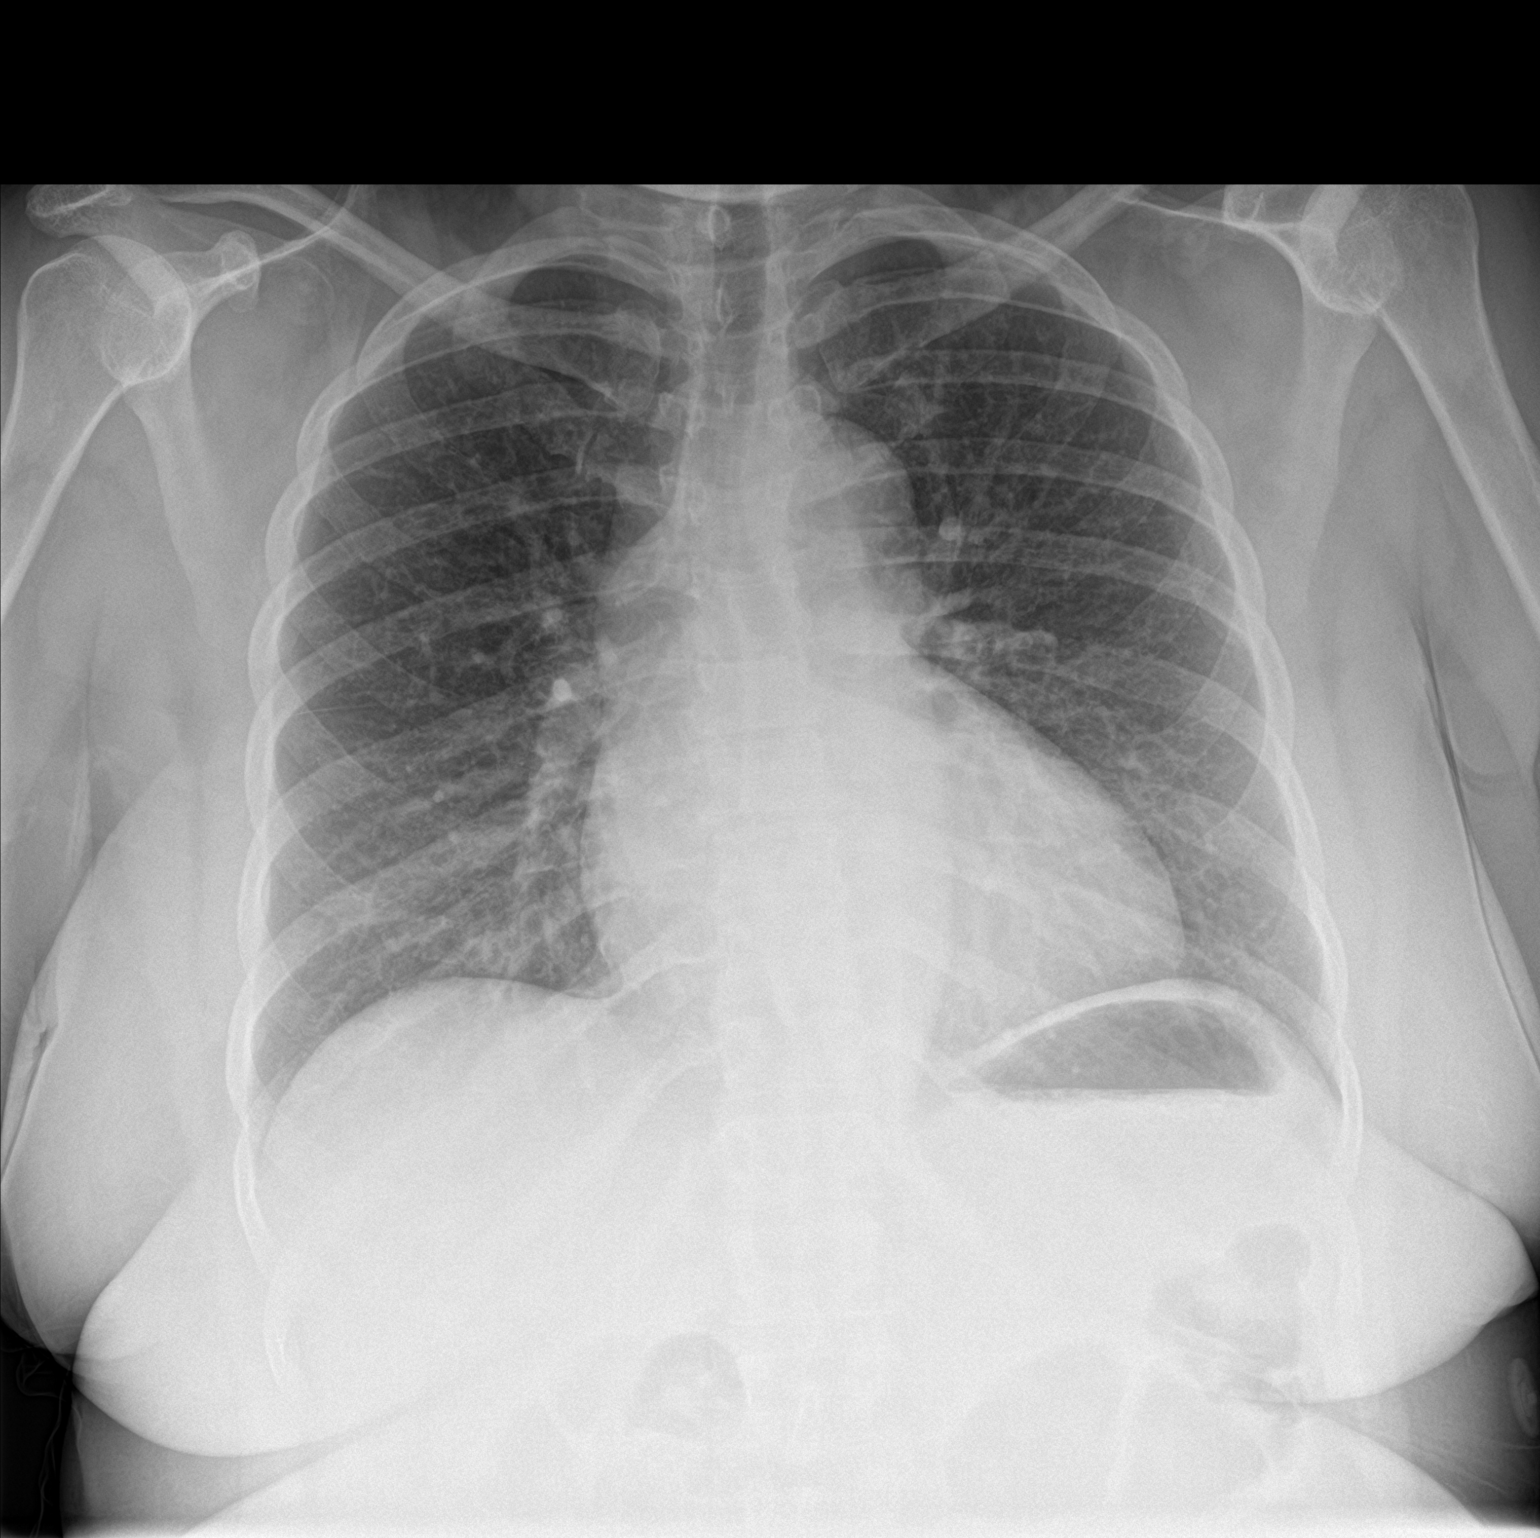

[chest lat]
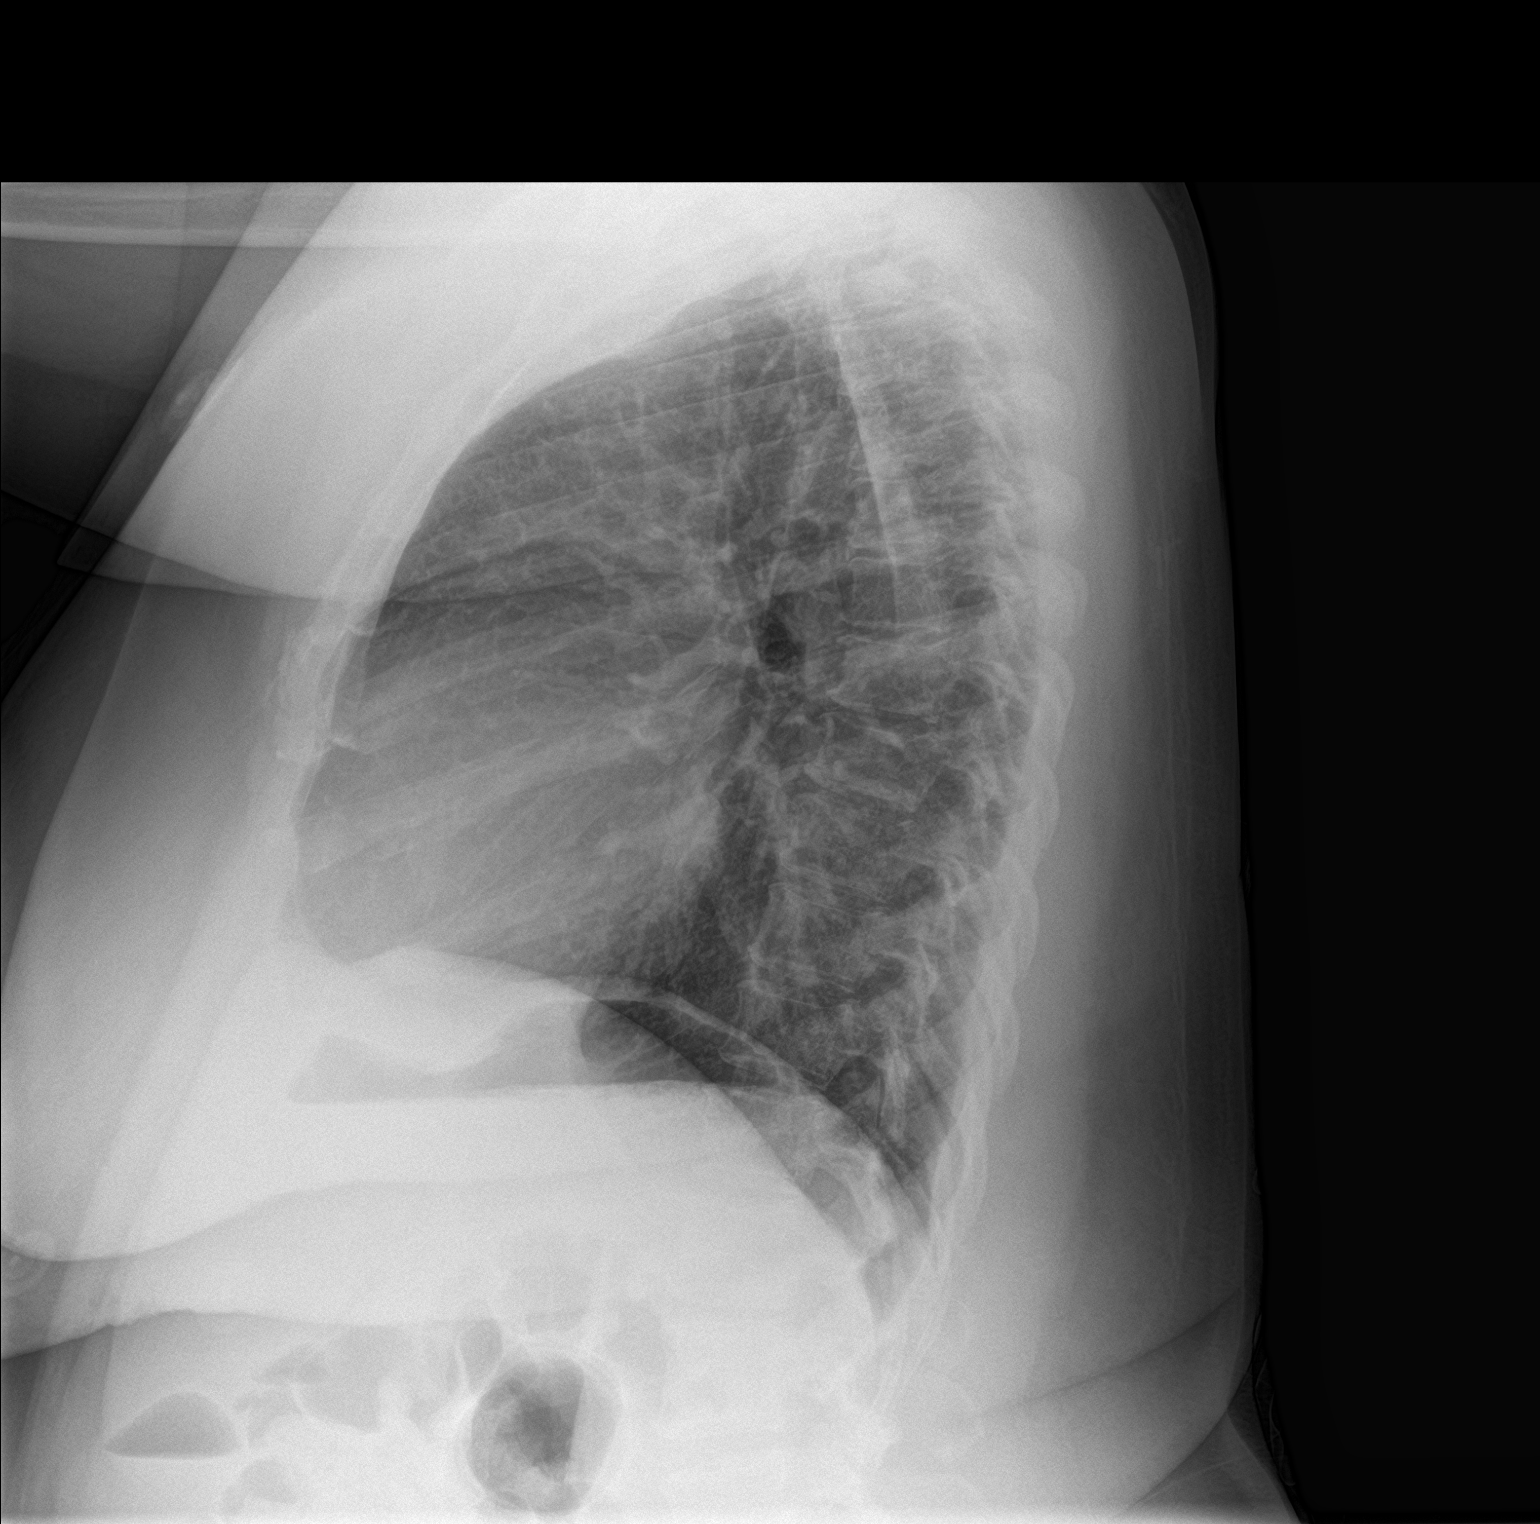

[2 of 2 positions shown; findings below may reference images not displayed]

FINDINGS: Normal heart size and pulmonary vascularity. No focal airspace
disease or consolidation in the lungs. No blunting of costophrenic
angles. No pneumothorax. Mediastinal contours appear intact. Mild
degenerative changes in the spine.
IMPRESSION: No active cardiopulmonary disease.

## 2018-03-24 IMAGING — CT CT ANGIO CHEST
2 of 6 series · 18 of 36 positions shown · IV contrast (iopamidol)
Comparison: [DATE]

CLINICAL DATA: Central chest pain, productive cough, and chest
congestion. History of hypertension and diabetes. Positive D-dimer.

EXAM:
CT ANGIOGRAPHY CHEST WITH CONTRAST
TECHNIQUE: Multidetector CT imaging of the chest was performed using the
standard protocol during bolus administration of intravenous
contrast. Multiplanar CT image reconstructions and MIPs were
obtained to evaluate the vascular anatomy.
CONTRAST:  100mL [6H] IOPAMIDOL ([6H]) INJECTION 76%

[Series 8: pe thins · axial · 0.76mm/px · z∈[-218,-17]mm · 17 of 322 slices shown]
[im 17/322  lung]
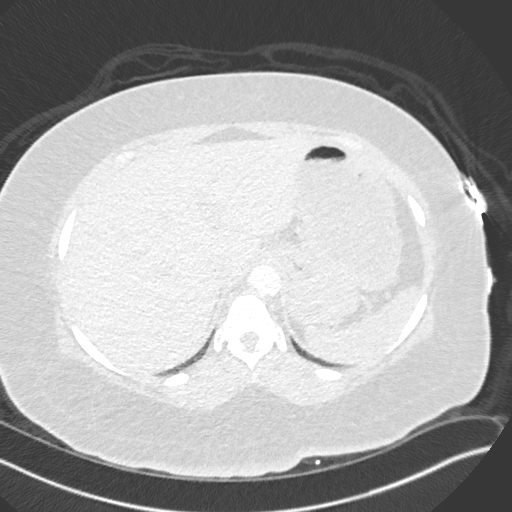
[im 33/322  mediastinal]
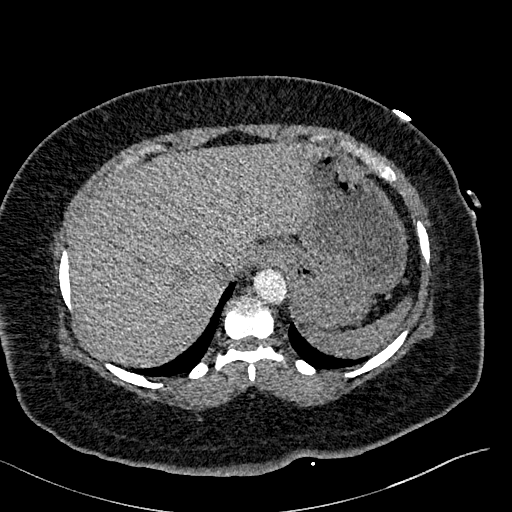
[im 49/322  lung]
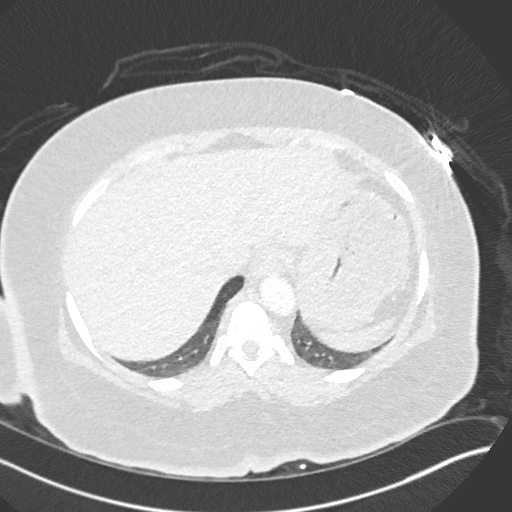
[im 65/322  mediastinal]
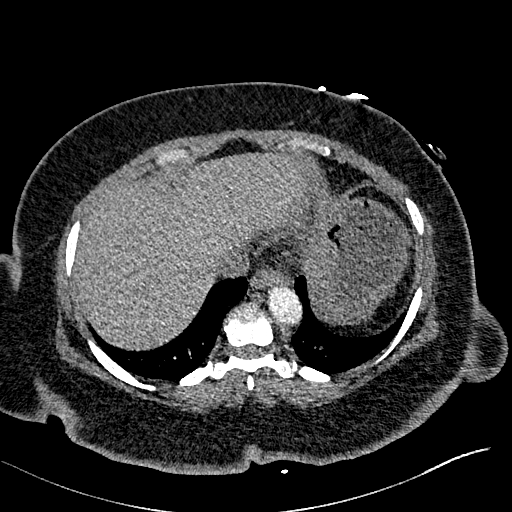
[im 97/322  lung]
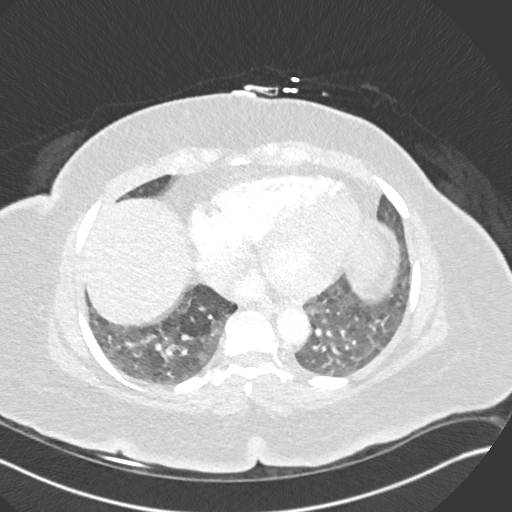
[im 113/322  mediastinal]
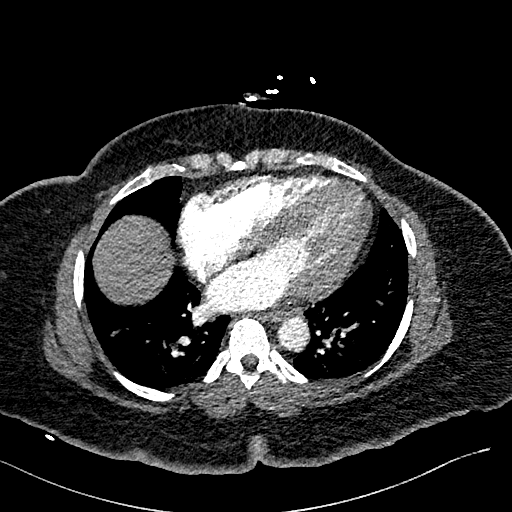
[im 129/322  lung]
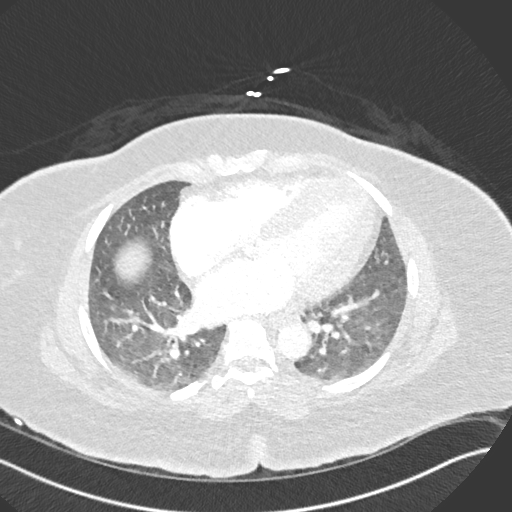
[im 145/322  mediastinal]
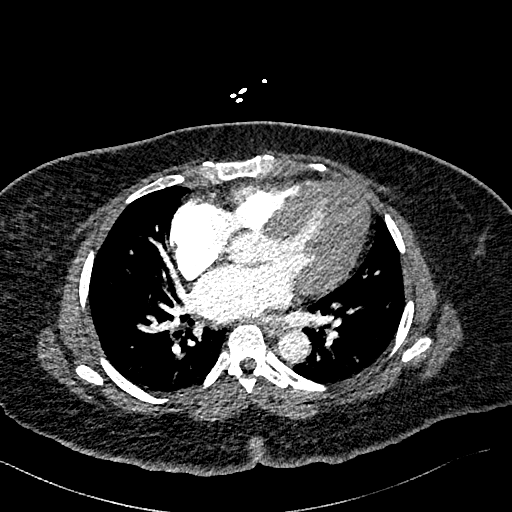
[im 161/322  lung]
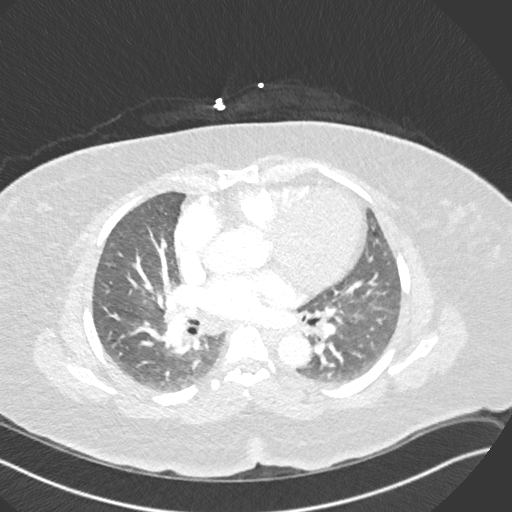
[im 177/322  mediastinal]
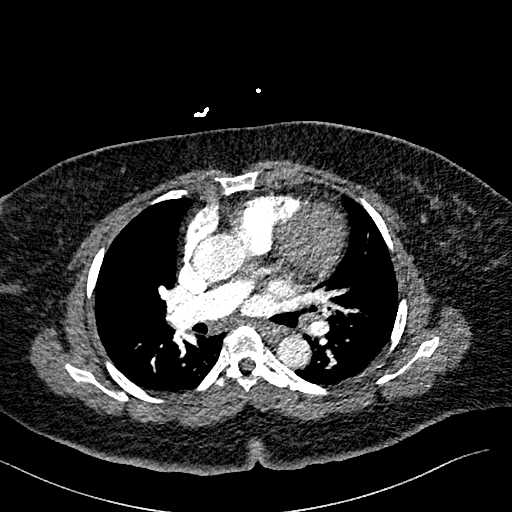
[im 193/322  lung]
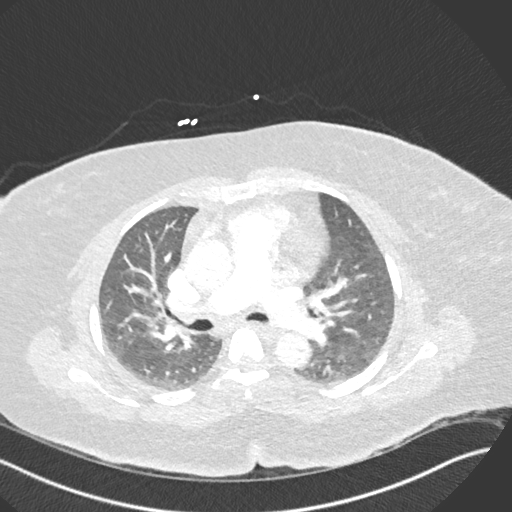
[im 209/322  mediastinal]
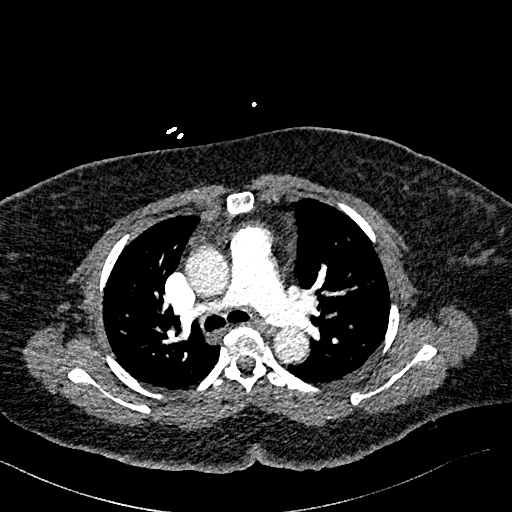
[im 225/322  lung]
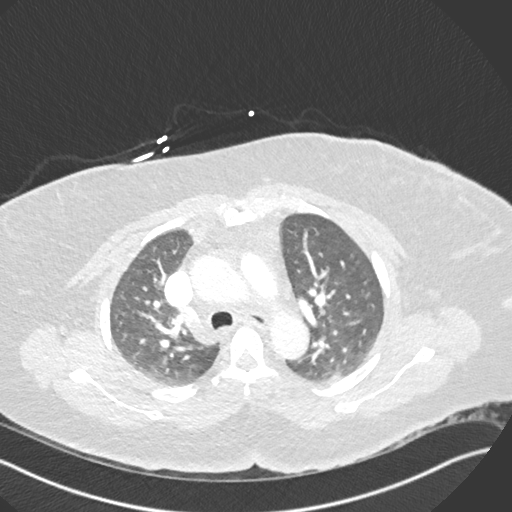
[im 257/322  mediastinal]
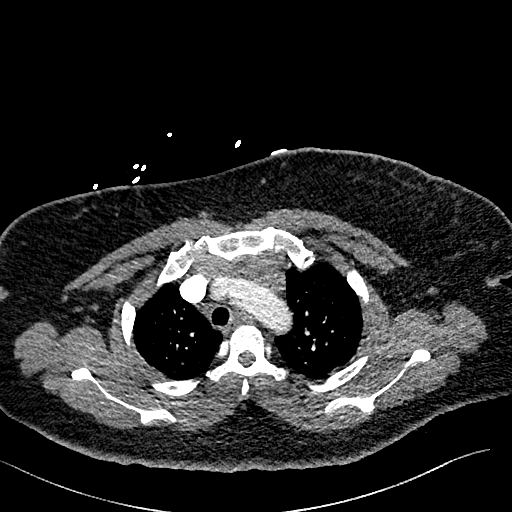
[im 273/322  lung]
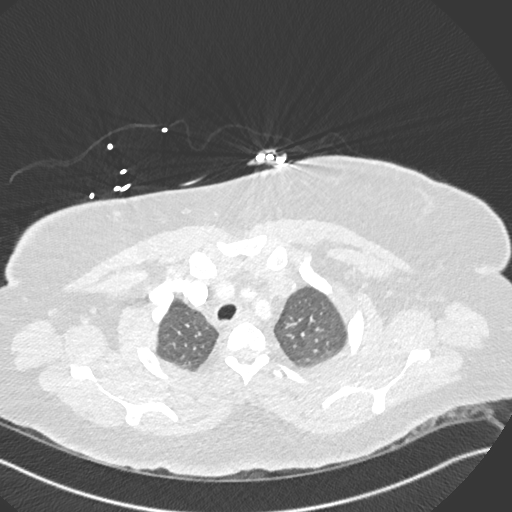
[im 289/322  mediastinal]
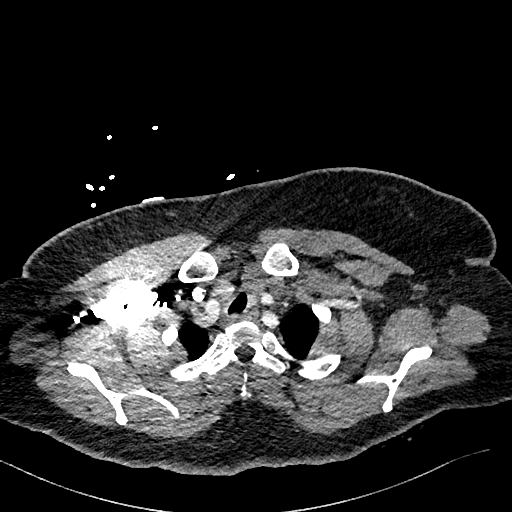
[im 305/322  lung]
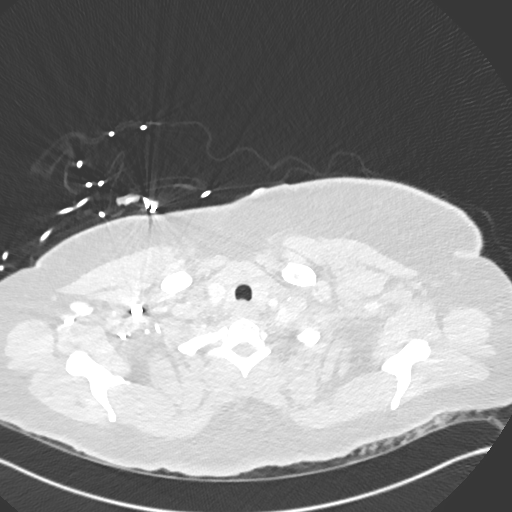

[Series 9: pe 2mm cor · coronal · 0.43mm/px · 1 of 125 slices shown]
[im 63/125  mediastinal]
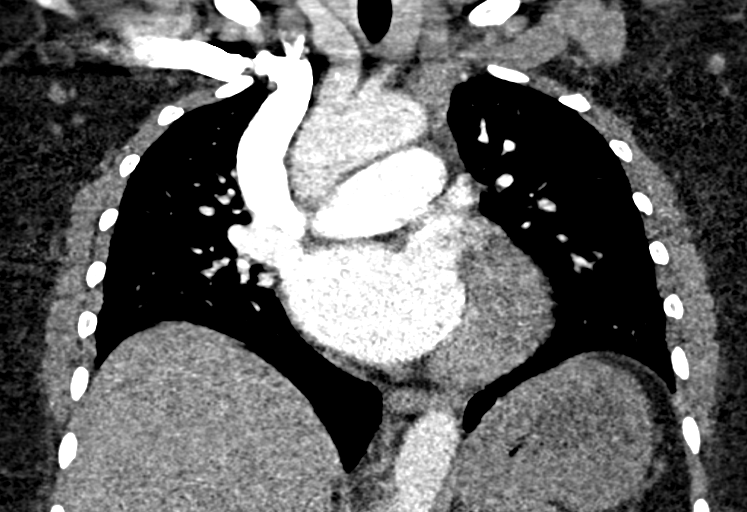

[18 of 36 positions shown; findings below may reference images not displayed]

FINDINGS: Cardiovascular: Technical quality of the examination is limited by
body habitus. There is good apparent opacification of the central
and segmental pulmonary arteries. No focal filling defects. No
evidence of significant pulmonary embolus. Normal heart size. No
pericardial effusion. Normal caliber thoracic aorta. No evidence of
dissection. Great vessel origins are patent.

Mediastinum/Nodes: No significant lymphadenopathy in the chest.
Esophagus is decompressed.

Lungs/Pleura: Evaluation is limited by motion artifact. There is
suggestion of mosaic attenuation pattern to the lungs. This could be
due to motion artifact or could indicate air trapping, edema, or
airways disease. No pleural effusions. No pneumothorax. Airways are
patent.

Upper Abdomen: No acute abnormality.

Musculoskeletal: No chest wall abnormality. No acute or significant
osseous findings.

Review of the MIP images confirms the above findings.
IMPRESSION: No evidence of significant pulmonary embolus. No evidence of active
pulmonary disease. Mosaic attenuation pattern to the lungs could be
due to motion artifact, edema, or airways disease.

## 2018-03-24 MED ORDER — IOPAMIDOL (ISOVUE-370) INJECTION 76%
INTRAVENOUS | Status: AC
  Filled 2018-03-24: qty 100

## 2018-03-24 MED ORDER — GUAIFENESIN-CODEINE 100-10 MG/5ML PO SYRP: 5.0000 mL | ORAL_SOLUTION | Freq: Three times a day (TID) | ORAL | 0 refills | Status: DC | PRN

## 2018-03-24 MED ORDER — KETOROLAC TROMETHAMINE 30 MG/ML IJ SOLN
30.0000 mg | Freq: Once | INTRAMUSCULAR | Status: AC
  Administered 2018-03-24: 30 mg via INTRAVENOUS
  Filled 2018-03-24: qty 1

## 2018-03-24 MED ORDER — IOPAMIDOL (ISOVUE-370) INJECTION 76%
100.0000 mL | Freq: Once | INTRAVENOUS | Status: AC | PRN
  Administered 2018-03-24: 100 mL via INTRAVENOUS

## 2018-03-24 NOTE — Discharge Instructions (Signed)
Your blood work, EKG, chest CT scan were reassuring today.   Please follow-up with [facility], I have given you the information below to establish care.  Please call and make an appointment.  I written you prescription for cough medicine, can make you drowsy so please do not drive, work or drink alcohol while taking it.  Come back to the ER if you have any new or concerning symptoms like worsening chest pain, chest pain that radiates your left shoulder or jaw, chest pain with sweating, feeling as if you are going to pass out.

## 2018-03-24 NOTE — ED Provider Notes (Signed)
[facility] EMERGENCY DEPARTMENT Provider Note   CSN: [id number] Arrival date & time: 03/24/18  0000     History   Chief Complaint Chief Complaint  Patient presents with  . Chest Pain  . Cough    HPI Verle Muradyan is a 42 y.o. female.  HPI   Jessyka Marner is a 42yo female with a history of insulin-dependent diabetes, hypertension, tobacco use, polysubstance abuse, bipolar disorder who presents to the emergency department for evaluation of cough, congestion and chest pain.  Patient reports that she developed a productive cough of white sputum yesterday as well as nasal congestion.  Today starting at 12 PM she began to have left-sided 10/10 severity constant sharp chest pain which radiates to her back.  Pain is triggered with coughing and taking a deep breath.  She did not try taking any over-the-counter medications for her symptoms.  She has associated chills, wheezing and some lightheadedness.  She denies diaphoresis, syncope, nausea/vomiting, fever, palpitations, abdominal pain, numbness, weakness, headache.  She reports that her mom had a heart attack at age 82.  No history of exertional chest pain.  No prior history of DVT/PE, she denies unilateral leg swelling or calf tenderness, denies recent surgery or immobilization, denies exogenous estrogen, no active cancer, no hemoptysis.  She has 2 children at home who recently had URI symptoms.  Past Medical History:  Diagnosis Date  . Bipolar 1 disorder ([place])   . 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 ([place]) 07/19/2017  . Hypertension     Patient Active Problem List   Diagnosis Date Noted  . Diabetes mellitus without complication ([place]) 35/36/1443  . Hypokalemia 12/06/2017  . Hypertension 12/06/2017  . 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 ([place]) 07/19/2017  . Bipolar disorder ([place]) 07/19/2017  . Polysubstance abuse ([place]) 07/19/2017  . Chest pain  07/19/2017  . Cocaine use disorder, severe, dependence ([place]) 03/24/2015  . Cannabis use disorder, moderate, dependence ([place]) 03/24/2015  . MDD (major depressive disorder), recurrent severe, without psychosis ([place]) 03/24/2015    Past Surgical History:  Procedure Laterality Date  . CESAREAN SECTION       OB History   None      Home Medications    Prior to Admission medications   Medication Sig Start Date End Date Taking? Authorizing Provider  albuterol (PROAIR HFA) 108 (90 Base) MCG/ACT inhaler Inhale 2 puffs into the lungs every 6 (six) hours as needed for wheezing or shortness of breath. 12/07/17   Emokpae, Courage, MD  amLODipine (NORVASC) 10 MG tablet Take 1 tablet (10 mg total) by mouth daily. 12/07/17   Roxan Hockey, MD  ARIPiprazole (ABILIFY) 10 MG tablet Take 1 tablet (10 mg total) by mouth daily. 12/07/17   Roxan Hockey, MD  blood glucose meter kit and supplies KIT Dispense based on patient and insurance preference. Use up to four times daily as directed. (FOR ICD-9 250.00, 250.01). Please give 3 months supply of lancets and test strips to allow her to check QID. 12/07/17   Roxan Hockey, MD  blood glucose meter kit and supplies Relion Prime or Dispense other brand based on patient and insurance preference. Use up to four times daily as directed. (FOR ICD-9 250.00, 250.01). 12/07/17   Roxan Hockey, MD  ibuprofen (ADVIL,MOTRIN) 600 MG tablet Take 1 tablet (600 mg total) by mouth every 6 (six) hours as needed. 12/21/17   Fransico Meadow, PA-C  Insulin Glargine (LANTUS SOLOSTAR) 100 UNIT/ML  Solostar Pen Inject 45 Units into the skin every morning. 12/07/17   Emokpae, Courage, MD  insulin lispro (HUMALOG KWIKPEN) 100 UNIT/ML KiwkPen Inject 0-0.2 mLs (0-20 Units total) into the skin 4 (four) times daily -  before meals and at bedtime. Inject 0-20 Units into the skin 3 (three) times daily with meals. CBG 70 - 120: 0 units  CBG 121 - 150: 3 units  CBG 151 - 200: 4 units  CBG  201 - 250: 7 units  CBG 251 - 300: 11 units  CBG 301 - 350: 15 units  CBG 351 - 400: 20 units 12/07/17   Emokpae, Courage, MD  insulin lispro (HUMALOG KWIKPEN) 100 UNIT/ML KiwkPen Inject 0.08 mLs (8 Units total) into the skin 3 (three) times daily. With Meals 12/07/17   Roxan Hockey, MD  insulin lispro (HUMALOG KWIKPEN) 100 UNIT/ML KiwkPen Inject 0-0.05 mLs (0-5 Units total) into the skin at bedtime. Inject 0-5 Units into the skin at Bedtime. CBG 70 - 120: 0 units  CBG 121 - 150: 0 units  CBG 151 - 200: 0 units  CBG 201 - 250: 2 units  CBG 251 - 300: 3 units  CBG 301 - 350: 4 units  CBG 351 - 400: 5 units 12/07/17   Emokpae, Courage, MD  Insulin Pen Needle 31G X 5 MM MISC Use as directed 12/07/17   Roxan Hockey, MD  isosorbide mononitrate (IMDUR) 30 MG 24 hr tablet Take 1 tablet (30 mg total) by mouth daily. 12/07/17 12/07/18  Roxan Hockey, MD  metFORMIN (GLUCOPHAGE) 1000 MG tablet Take 1 tablet (1,000 mg total) by mouth 2 (two) times daily with a meal. 12/07/17 12/07/18  Emokpae, Courage, MD  ondansetron (ZOFRAN) 4 MG tablet Take 1 tablet (4 mg total) by mouth every 6 (six) hours. Prn n/v. 12/07/17   Roxan Hockey, MD    Family History Family History  Problem Relation Age of Onset  . Hypertension Mother   . CAD Mother 42       died of MI at age 38  . Hypertension Father     Social History Social History   Tobacco Use  . Smoking status: Current Every Day Smoker    Packs/day: 0.50    Types: Cigarettes  . Smokeless tobacco: Never Used  Substance Use Topics  . Alcohol use: No  . Drug use: Yes    Types: Marijuana, Cocaine     Allergies   Hydrocodone and Latex   Review of Systems Review of Systems  Constitutional: Positive for chills. Negative for fever.  HENT: Positive for congestion. Negative for sore throat.   Eyes: Negative for visual disturbance.  Respiratory: Positive for cough, shortness of breath and wheezing.   Cardiovascular: Positive for chest pain.  Negative for leg swelling.  Gastrointestinal: Negative for abdominal pain, nausea and vomiting.  Genitourinary: Negative for difficulty urinating.  Musculoskeletal: Negative for back pain.  Skin: Negative for color change.  Neurological: Positive for light-headedness. Negative for syncope, weakness, numbness and headaches.     Physical Exam Updated Vital Signs BP (!) 169/92 (BP Location: Right Arm)   Pulse 73   Temp 98.8 F (37.1 C) (Oral)   Resp 18   LMP 02/18/2018 (Approximate)   SpO2 96%   Physical Exam  Constitutional: She is oriented to person, place, and time. She appears well-developed and well-nourished. No distress.  No acute distress, nontoxic-appearing.  HENT:  Head: Normocephalic and atraumatic.  Mouth/Throat: Oropharynx is clear and moist.  Eyes: Pupils are equal,  round, and reactive to light. Conjunctivae are normal. Right eye exhibits no discharge. Left eye exhibits no discharge.  Neck: Normal range of motion. No JVD present. No tracheal deviation present.  Cardiovascular:  Regular rate and rhythm.  Grade 3/6 systolic murmur.  Pulmonary/Chest: Effort normal and breath sounds normal. No stridor. No respiratory distress. She has no wheezes. She has no rales.  Left anterior chest wall tender to palpation, no overlying rash or bruising on the skin.  Lungs clear to auscultation.  Abdominal: Soft. Bowel sounds are normal. There is no tenderness.  Musculoskeletal:  No leg swelling or calf tenderness.  DP pulses 2+ and symmetric.  Neurological: She is alert and oriented to person, place, and time. Coordination normal.  Skin: Skin is warm and dry. Capillary refill takes less than 2 seconds. She is not diaphoretic.  Psychiatric: She has a normal mood and affect. Her behavior is normal.  Nursing note and vitals reviewed.    ED Treatments / Results  Labs (all labs ordered are listed, but only abnormal results are displayed) Labs Reviewed  BASIC METABOLIC PANEL -  Abnormal; Notable for the following components:      Result Value   Glucose, Bld 204 (*)    Creatinine, Ser 1.09 (*)    All other components within normal limits  CBC - Abnormal; Notable for the following components:   Hemoglobin 10.8 (*)    MCH 24.7 (*)    MCHC 29.4 (*)    RDW 16.0 (*)    All other components within normal limits  D-DIMER, QUANTITATIVE (NOT AT [facility]) - Abnormal; Notable for the following components:   D-Dimer, Quant 0.73 (*)    All other components within normal limits  I-STAT TROPONIN, ED  I-STAT BETA HCG BLOOD, ED ([facility], [facility], [facility] ONLY)  I-STAT TROPONIN, ED    EKG EKG Interpretation  Date/Time:  Wednesday March 24 2018 02:13:39 EDT Ventricular Rate:  83 PR Interval:    QRS Duration: 111 QT Interval:  405 QTC Calculation: 476 R Axis:   39 Text Interpretation:  Normal sinus rhythm No significant change since last tracing Confirmed by Addison Lank [phone]) on 03/24/2018 2:20:13 AM   Radiology Dg Chest 2 View  Result Date: 03/24/2018 CLINICAL DATA:  Chest pain, congestion, shortness of breath, and cough for 1 day. EXAM: CHEST - 2 VIEW COMPARISON:  01/21/2018 FINDINGS: Normal heart size and pulmonary vascularity. No focal airspace disease or consolidation in the lungs. No blunting of costophrenic angles. No pneumothorax. Mediastinal contours appear intact. Mild degenerative changes in the spine. IMPRESSION: No active cardiopulmonary disease. Electronically Signed   By: Lucienne Capers M.D.   On: 03/24/2018 00:44   Ct Angio Chest Pe W And/or Wo Contrast  Result Date: 03/24/2018 CLINICAL DATA:  Central chest pain, productive cough, and chest congestion. History of hypertension and diabetes. Positive D-dimer. EXAM: CT ANGIOGRAPHY CHEST WITH CONTRAST TECHNIQUE: Multidetector CT imaging of the chest was performed using the standard protocol during bolus administration of intravenous contrast. Multiplanar CT image reconstructions and MIPs were obtained to evaluate the  vascular anatomy. CONTRAST:  19m ISOVUE-370 IOPAMIDOL (ISOVUE-370) INJECTION 76% COMPARISON:  12/06/2017 FINDINGS: Cardiovascular: Technical quality of the examination is limited by body habitus. There is good apparent opacification of the central and segmental pulmonary arteries. No focal filling defects. No evidence of significant pulmonary embolus. Normal heart size. No pericardial effusion. Normal caliber thoracic aorta. No evidence of dissection. Great vessel origins are patent. Mediastinum/Nodes: No significant lymphadenopathy in the chest. Esophagus  is decompressed. Lungs/Pleura: Evaluation is limited by motion artifact. There is suggestion of mosaic attenuation pattern to the lungs. This could be due to motion artifact or could indicate air trapping, edema, or airways disease. No pleural effusions. No pneumothorax. Airways are patent. Upper Abdomen: No acute abnormality. Musculoskeletal: No chest wall abnormality. No acute or significant osseous findings. Review of the MIP images confirms the above findings. IMPRESSION: No evidence of significant pulmonary embolus. No evidence of active pulmonary disease. Mosaic attenuation pattern to the lungs could be due to motion artifact, edema, or airways disease. Electronically Signed   By: Lucienne Capers M.D.   On: 03/24/2018 03:38    Procedures Procedures (including critical care time)  Medications Ordered in ED Medications  iopamidol (ISOVUE-370) 76 % injection (has no administration in time range)  ketorolac (TORADOL) 30 MG/ML injection 30 mg (30 mg Intravenous Given 03/24/18 0150)  iopamidol (ISOVUE-370) 76 % injection 100 mL (100 mLs Intravenous Contrast Given 03/24/18 0323)     Initial Impression / Assessment and Plan / ED Course  I have reviewed the triage vital signs and the nursing notes.  Pertinent labs & imaging results that were available during my care of the patient were reviewed by me and considered in my medical decision making (see  chart for details).     Patient presents with cough, congestion and constant left sided chest pain radiating to the left back which started today. On exam she is tachycardic to 108bpm. No appreciable leg swelling or calf tenderness. Anterior chest wall tender to palpation.   Results reviewed. CXR without acute abnormality, no pneumonia, pneumothorax or pneumomediastinum.  Initial troponin negative and EKG without acute ischemic change from previous. CBC and BMP unremarkable. Will order Ddimer to further evaluate for PE given tachycardia and report that pain is pleuritic.   Ddimer positive, CT angio without pulmonary embolus. Delta troponin negative. I do not suspect aortic dissection given no widening of mediastinum, no pulse deficits, no neurological symptoms. No fever, white count and patient non-toxic therefore doubt esophageal perforation. No diffuse ST-elevation, positional changes or fever to suggest pericarditis. Her symptoms are likely musculoskeletal given recent cough and worsening with palpation of the anterior chest wall. Plan to discharge with anti-tussive and NSAIDs as needed for pain. Discussed follow up with PCP regarding today's ER and have given her information to [place] and wellness. We also discussed strict return precautions. Patient agrees and appears reliable.   Final Clinical Impressions(s) / ED Diagnoses   Final diagnoses:  Viral upper respiratory tract infection    ED Discharge Orders         Ordered    guaiFENesin-codeine [facility] AC) 100-10 MG/5ML syrup  3 times daily PRN     03/24/18 0451           Glyn Ade, PA-C 03/24/18 0451    Fatima Blank, MD 03/24/18 [phone]

## 2018-03-24 NOTE — ED Triage Notes (Signed)
Patient reports central chest pain with productive cough and chest congestion onset today , pain radiating to upper back , no emesis or diaphoresis, denies fever or chills .

## 2018-04-05 ENCOUNTER — Emergency Department (HOSPITAL_COMMUNITY)
Admission: EM | Admit: 2018-04-05 | Discharge: 2018-04-05 | Disposition: A | Discharge status: Home / Self Care | Payer: Medicaid Other | Attending: Emergency Medicine | Admitting: Emergency Medicine

## 2018-04-05 ENCOUNTER — Emergency Department (HOSPITAL_COMMUNITY): Payer: Medicaid Other

## 2018-04-05 DIAGNOSIS — R079 Chest pain, unspecified: Secondary | ICD-10-CM

## 2018-04-05 DIAGNOSIS — E119 Type 2 diabetes mellitus without complications: Secondary | ICD-10-CM | POA: Insufficient documentation

## 2018-04-05 DIAGNOSIS — F319 Bipolar disorder, unspecified: Secondary | ICD-10-CM | POA: Insufficient documentation

## 2018-04-05 DIAGNOSIS — I10 Essential (primary) hypertension: Secondary | ICD-10-CM | POA: Insufficient documentation

## 2018-04-05 DIAGNOSIS — F17210 Nicotine dependence, cigarettes, uncomplicated: Secondary | ICD-10-CM | POA: Diagnosis not present

## 2018-04-05 LAB — BASIC METABOLIC PANEL
Anion gap: 7 (ref 5–15)
BUN: 13 mg/dL (ref 6–20)
CALCIUM: 8.7 mg/dL — AB (ref 8.9–10.3)
CO2: 26 mmol/L (ref 22–32)
CREATININE: 0.89 mg/dL (ref 0.44–1.00)
Chloride: 105 mmol/L (ref 98–111)
GFR calc Af Amer: >60 mL/min (ref >60)
GFR calc non Af Amer: >60 mL/min (ref >60)
Glucose, Bld: 155 mg/dL — ABNORMAL HIGH (ref 70–99)
Potassium: 3.4 mmol/L — ABNORMAL LOW (ref 3.5–5.1)
Sodium: 138 mmol/L (ref 135–145)

## 2018-04-05 LAB — CBC
HCT: 34.6 % — ABNORMAL LOW (ref 36.0–46.0)
Hemoglobin: 10.2 g/dL — ABNORMAL LOW (ref 12.0–15.0)
MCH: 24.7 pg — AB (ref 26.0–34.0)
MCHC: 29.5 g/dL — AB (ref 30.0–36.0)
MCV: 83.8 fL (ref 80.0–100.0)
PLATELETS: 332 10*3/uL (ref 150–400)
RBC: 4.13 MIL/uL (ref 3.87–5.11)
RDW: 15.8 % — ABNORMAL HIGH (ref 11.5–15.5)
WBC: 6.1 10*3/uL (ref 4.0–10.5)
nRBC: 0 % (ref 0.0–0.2)

## 2018-04-05 LAB — I-STAT BETA HCG BLOOD, ED (MC, WL, AP ONLY): I-stat hCG, quantitative: <5 m[IU]/mL (ref <5)

## 2018-04-05 LAB — I-STAT TROPONIN, ED: TROPONIN I, POC: 0.01 ng/mL (ref 0.00–0.08)

## 2018-04-05 IMAGING — CR DG CHEST 2V
2 series · 2 of 2 positions shown · non-contrast
Comparison: Radiograph and CT [DATE]

CLINICAL DATA: Left-sided chest pain.

EXAM:
CHEST - 2 VIEW

[w chest pa]
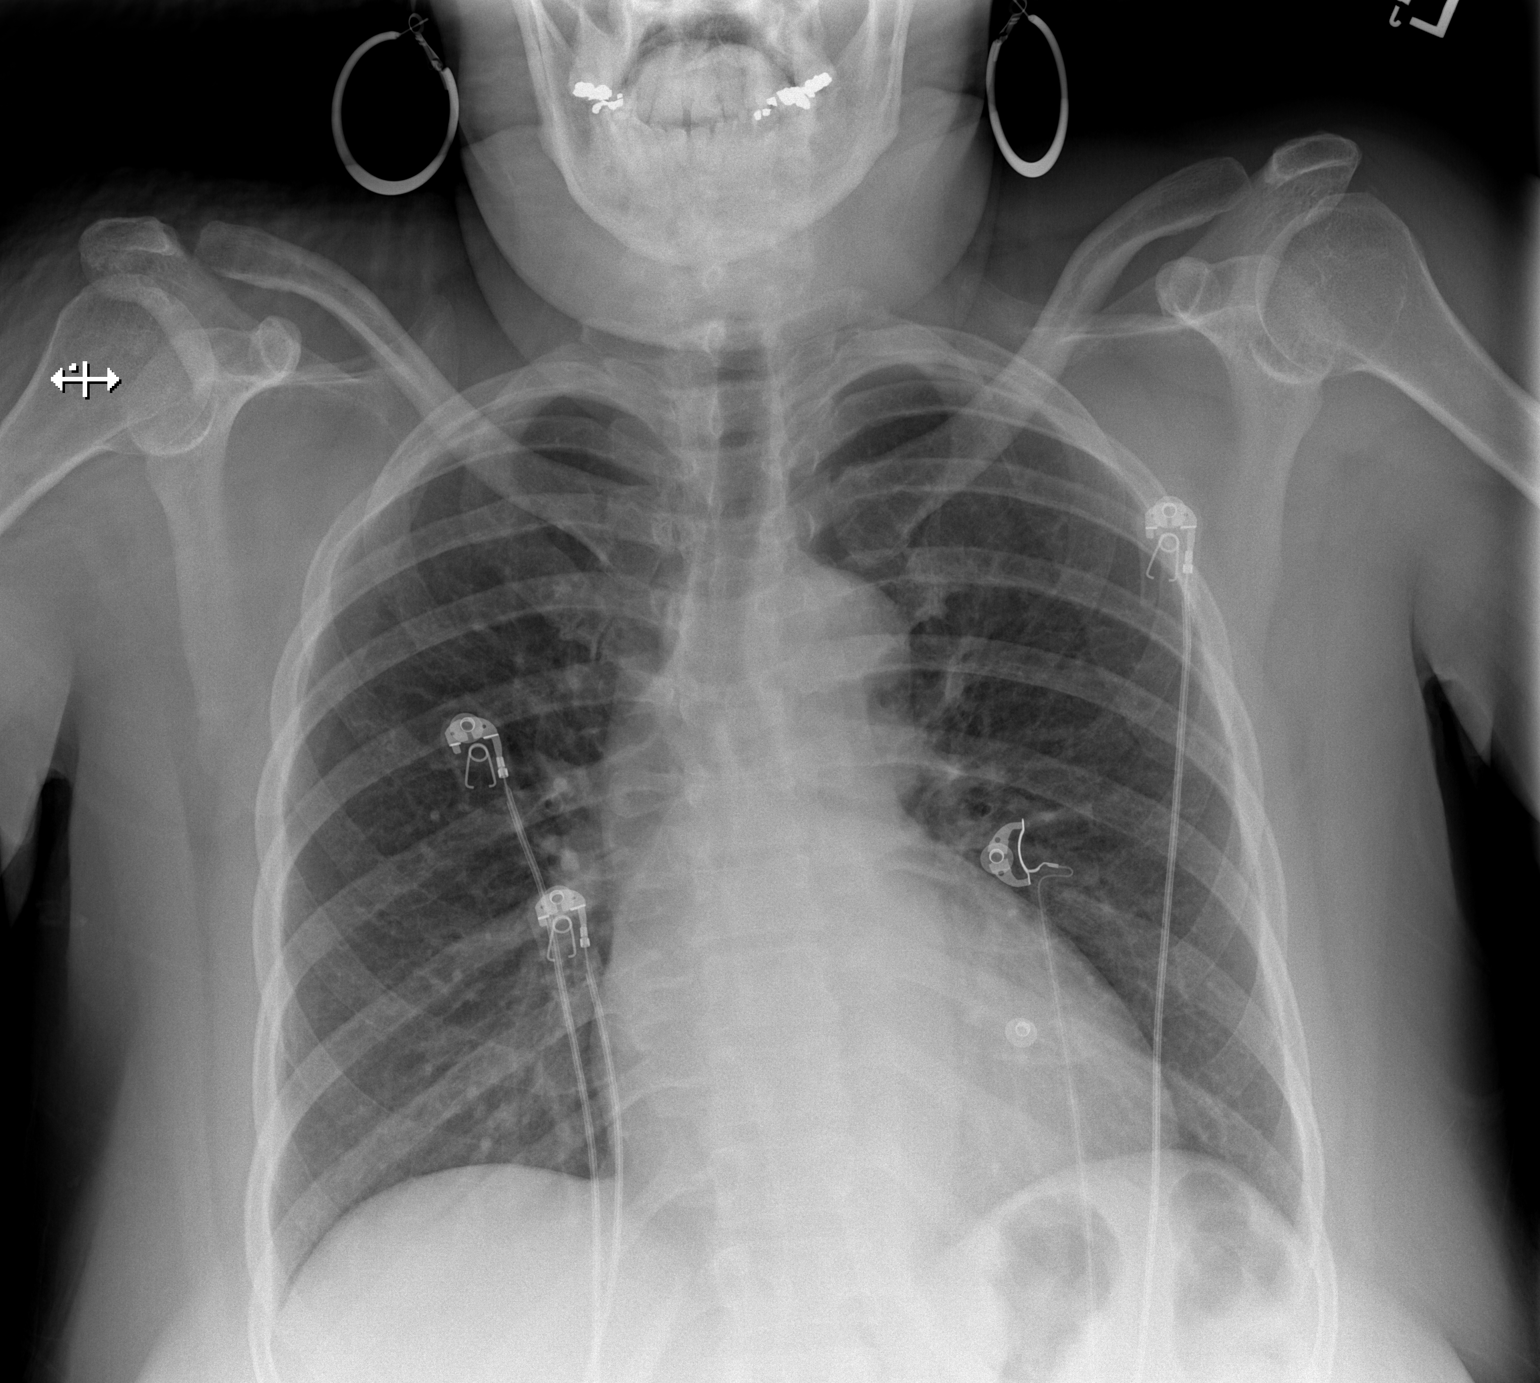

[w chest lat]
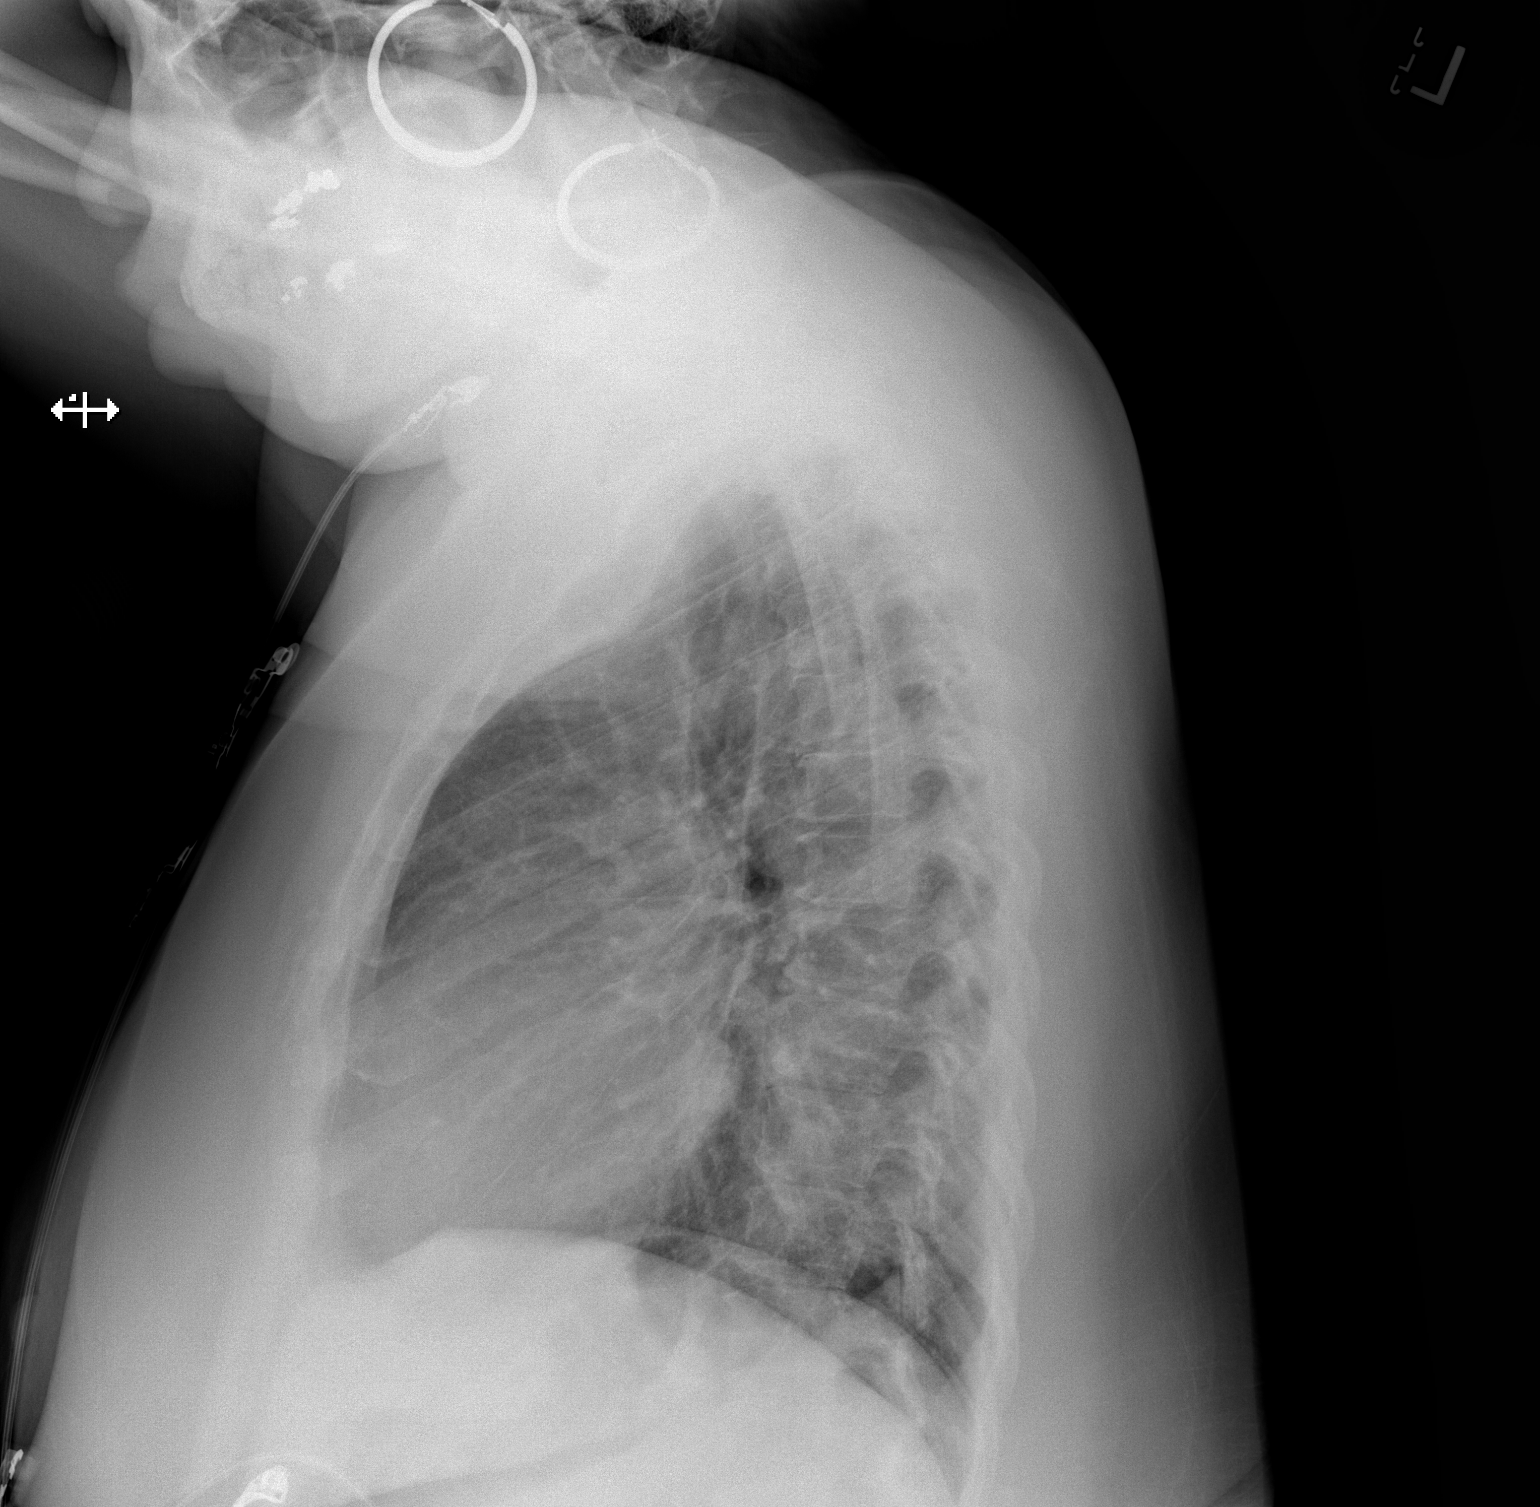

[2 of 2 positions shown; findings below may reference images not displayed]

FINDINGS: The cardiomediastinal contours are unchanged, mild cardiomegaly. The
lungs are clear. Pulmonary vasculature is normal. No consolidation,
pleural effusion, or pneumothorax. No acute osseous abnormalities
are seen.
IMPRESSION: No acute chest finding.

## 2018-04-05 NOTE — ED Notes (Signed)
Upon entering room to review resources with pt she states she only feels safe here but will not give any details as to why.

## 2018-04-05 NOTE — ED Notes (Signed)
Bed: NM07 Expected date:  Expected time:  Means of arrival:  Comments: EMS 41 F chest wall pain

## 2018-04-05 NOTE — ED Notes (Addendum)
Pt refusing to leave at present time, will speak with EDP regarding.pt requesting to speak with someone, denies any attempt to harm self or others.

## 2018-04-05 NOTE — ED Triage Notes (Signed)
Patient arrived with EMS c/o Chest pain. Per pt pain get worsen when moving. Pt also complain of n/v x2 days. Pt very anxious and tearful when talking to patient.

## 2018-04-05 NOTE — Discharge Instructions (Signed)
Your cardiac tests today were normal. We recommend that you follow-up with your primary care doctor. Return here for any new/acute changes.

## 2018-04-05 NOTE — ED Provider Notes (Signed)
[facility] DEPT Provider Note   CSN: [id number] Arrival date & time: 04/05/18  0050     History   Chief Complaint Chief Complaint  Patient presents with  . Chest Pain    HPI Kaitlyn Gray is a 42 y.o. female.  The history is provided by the patient and medical records.    42 year old female with history of bipolar disorder, hypertension, diabetes, polysubstance abuse, presenting to the ED with chest pain.  Patient is sleeping and had to be awoken for exam.  She is very vague in her answers.  When asked about her pain she states "I do not know, it just hurts".  When asked her how long this is been going on she shrugged her shoulders.  Patient falling asleep multiple times during exam, refusing to answer any further questions.  She apparent told RN she has had pain for 2 days.  She does not have any apparent cardiac history based on chart review.  She is a daily smoker.     Past Medical History:  Diagnosis Date  . Bipolar 1 disorder ([place])   . 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 ([place]) 07/19/2017  . Hypertension     Patient Active Problem List   Diagnosis Date Noted  . Diabetes mellitus without complication ([place]) 37/16/9678  . Hypokalemia 12/06/2017  . Hypertension 12/06/2017  . 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 ([place]) 07/19/2017  . Bipolar disorder ([place]) 07/19/2017  . Polysubstance abuse ([place]) 07/19/2017  . Chest pain 07/19/2017  . Cocaine use disorder, severe, dependence ([place]) 03/24/2015  . Cannabis use disorder, moderate, dependence ([place]) 03/24/2015  . MDD (major depressive disorder), recurrent severe, without psychosis ([place]) 03/24/2015    Past Surgical History:  Procedure Laterality Date  . CESAREAN SECTION       OB History   None      Home Medications    Prior to Admission medications   Medication Sig Start Date End Date Taking? Authorizing Provider    blood glucose meter kit and supplies KIT Dispense based on patient and insurance preference. Use up to four times daily as directed. (FOR ICD-9 250.00, 250.01). Please give 3 months supply of lancets and test strips to allow her to check QID. 12/07/17  Yes Emokpae, Courage, MD  blood glucose meter kit and supplies Relion Prime or Dispense other brand based on patient and insurance preference. Use up to four times daily as directed. (FOR ICD-9 250.00, 250.01). 12/07/17  Yes Emokpae, Courage, MD  Insulin Glargine (LANTUS SOLOSTAR) 100 UNIT/ML Solostar Pen Inject 45 Units into the skin every morning. 12/07/17  Yes Emokpae, Courage, MD  insulin lispro (HUMALOG KWIKPEN) 100 UNIT/ML KiwkPen Inject 0-0.2 mLs (0-20 Units total) into the skin 4 (four) times daily -  before meals and at bedtime. Inject 0-20 Units into the skin 3 (three) times daily with meals. CBG 70 - 120: 0 units  CBG 121 - 150: 3 units  CBG 151 - 200: 4 units  CBG 201 - 250: 7 units  CBG 251 - 300: 11 units  CBG 301 - 350: 15 units  CBG 351 - 400: 20 units 12/07/17  Yes Emokpae, Courage, MD  Insulin Pen Needle 31G X 5 MM MISC Use as directed 12/07/17  Yes Emokpae, Courage, MD  metFORMIN (GLUCOPHAGE) 1000 MG tablet Take 1 tablet (1,000 mg total) by mouth 2 (two) times daily with a meal. 12/07/17 12/07/18 Yes Roxan Hockey, MD  albuterol (  PROAIR HFA) 108 (90 Base) MCG/ACT inhaler Inhale 2 puffs into the lungs every 6 (six) hours as needed for wheezing or shortness of breath. Patient not taking: Reported on 03/24/2018 12/07/17   Roxan Hockey, MD  amLODipine (NORVASC) 10 MG tablet Take 1 tablet (10 mg total) by mouth daily. Patient not taking: Reported on 03/24/2018 12/07/17   Roxan Hockey, MD  ARIPiprazole (ABILIFY) 10 MG tablet Take 1 tablet (10 mg total) by mouth daily. Patient not taking: Reported on 03/24/2018 12/07/17   Roxan Hockey, MD  guaiFENesin-codeine (ROBITUSSIN AC) 100-10 MG/5ML syrup Take 5 mLs by mouth 3 (three) times  daily as needed for cough. Patient not taking: Reported on 04/05/2018 03/24/18   Glyn Ade, PA-C  ibuprofen (ADVIL,MOTRIN) 600 MG tablet Take 1 tablet (600 mg total) by mouth every 6 (six) hours as needed. Patient not taking: Reported on 03/24/2018 12/21/17   Fransico Meadow, PA-C  insulin lispro (HUMALOG KWIKPEN) 100 UNIT/ML KiwkPen Inject 0.08 mLs (8 Units total) into the skin 3 (three) times daily. With Meals Patient not taking: Reported on 04/05/2018 12/07/17   Roxan Hockey, MD  insulin lispro (HUMALOG KWIKPEN) 100 UNIT/ML KiwkPen Inject 0-0.05 mLs (0-5 Units total) into the skin at bedtime. Inject 0-5 Units into the skin at Bedtime. CBG 70 - 120: 0 units  CBG 121 - 150: 0 units  CBG 151 - 200: 0 units  CBG 201 - 250: 2 units  CBG 251 - 300: 3 units  CBG 301 - 350: 4 units  CBG 351 - 400: 5 units Patient not taking: Reported on 03/24/2018 12/07/17   Roxan Hockey, MD  isosorbide mononitrate (IMDUR) 30 MG 24 hr tablet Take 1 tablet (30 mg total) by mouth daily. Patient not taking: Reported on 03/24/2018 12/07/17 12/07/18  Roxan Hockey, MD  ondansetron (ZOFRAN) 4 MG tablet Take 1 tablet (4 mg total) by mouth every 6 (six) hours. Prn n/v. Patient not taking: Reported on 03/24/2018 12/07/17   Roxan Hockey, MD    Family History Family History  Problem Relation Age of Onset  . Hypertension Mother   . CAD Mother 50       died of MI at age 5  . Hypertension Father     Social History Social History   Tobacco Use  . Smoking status: Current Every Day Smoker    Packs/day: 0.50    Types: Cigarettes  . Smokeless tobacco: Never Used  Substance Use Topics  . Alcohol use: No  . Drug use: Yes    Types: Marijuana, Cocaine     Allergies   Hydrocodone and Latex   Review of Systems Review of Systems  Cardiovascular: Positive for chest pain.  All other systems reviewed and are negative.    Physical Exam Updated Vital Signs BP 139/80   Pulse 78   Temp 98.5 F (36.9  C)   Resp (!) 24   SpO2 93%   Physical Exam  Constitutional: She is oriented to person, place, and time. She appears well-developed and well-nourished.  Sleeping, had to be awoken for exam  HENT:  Head: Normocephalic and atraumatic.  Mouth/Throat: Oropharynx is clear and moist.  Eyes: Pupils are equal, round, and reactive to light. Conjunctivae and EOM are normal.  Neck: Normal range of motion.  Cardiovascular: Normal rate, regular rhythm and normal heart sounds.  Pulmonary/Chest: Effort normal and breath sounds normal. No stridor. No respiratory distress.  Abdominal: Soft. Bowel sounds are normal. There is no tenderness. There is no rebound.  Musculoskeletal: Normal range of motion.  Neurological: She is alert and oriented to person, place, and time.  Skin: Skin is warm and dry.  Psychiatric: She has a normal mood and affect.  Nursing note and vitals reviewed.    ED Treatments / Results  Labs (all labs ordered are listed, but only abnormal results are displayed) Labs Reviewed  BASIC METABOLIC PANEL - Abnormal; Notable for the following components:      Result Value   Potassium 3.4 (*)    Glucose, Bld 155 (*)    Calcium 8.7 (*)    All other components within normal limits  CBC - Abnormal; Notable for the following components:   Hemoglobin 10.2 (*)    HCT 34.6 (*)    MCH 24.7 (*)    MCHC 29.5 (*)    RDW 15.8 (*)    All other components within normal limits  I-STAT TROPONIN, ED  I-STAT BETA HCG BLOOD, ED ([facility], [facility], [facility] ONLY)    EKG None  Radiology Dg Chest 2 View  Result Date: 04/05/2018 CLINICAL DATA:  Left-sided chest pain. EXAM: CHEST - 2 VIEW COMPARISON:  Radiograph and CT 03/24/2018 FINDINGS: The cardiomediastinal contours are unchanged, mild cardiomegaly. The lungs are clear. Pulmonary vasculature is normal. No consolidation, pleural effusion, or pneumothorax. No acute osseous abnormalities are seen. IMPRESSION: No acute chest finding. Electronically Signed   By:  Keith Rake M.D.   On: 04/05/2018 04:43    Procedures Procedures (including critical care time)  Medications Ordered in ED Medications - No data to display   Initial Impression / Assessment and Plan / ED Course  I have reviewed the triage vital signs and the nursing notes.  Pertinent labs & imaging results that were available during my care of the patient were reviewed by me and considered in my medical decision making (see chart for details).  42 y.o. F here with chest pain.  Patient sleeping, had to be awoken for exam.  Once awoken, she is somewhat uncooperative exam and only answering limited questions.  When asked about her pain she states "I do not know, it just hurts".  Cannot tell me how long this is been going on-- apparently told RN 2 days.  Patient falling asleep multiple times during exam.  EKG is nonischemic.  Labs overall reassuring.  Chest x-ray is clear.  Patient with negative cardiac work-up recently on 03/24/2018 with CTA that was negative for PE.  She does not have any tachycardia or hypoxia here.  Vitals are stable.  At this point, doubt  ACS, PE, dissection, or other acute cardiac event.  Patient will be discharged home to follow-up with PCP.  She can return here for any new/acute changes.  Final Clinical Impressions(s) / ED Diagnoses   Final diagnoses:  Chest pain, unspecified type    ED Discharge Orders    None       Larene Pickett, PA-C 04/05/18 [id number]    Varney Biles, MD 04/05/18 [phone]

## 2018-04-05 NOTE — ED Notes (Signed)
Pt provided discharge instructions but did not sign

## 2018-04-06 DIAGNOSIS — E0800 Diabetes mellitus due to underlying condition with hyperosmolarity without nonketotic hyperglycemic-hyperosmolar coma (NKHHC): Secondary | ICD-10-CM

## 2018-04-07 DIAGNOSIS — E0800 Diabetes mellitus due to underlying condition with hyperosmolarity without nonketotic hyperglycemic-hyperosmolar coma (NKHHC): Secondary | ICD-10-CM

## 2018-04-07 LAB — GLUCOSE, POCT (MANUAL RESULT ENTRY): POC Glucose: 236 mg/dl (ref 70–99)

## 2018-04-07 NOTE — Congregational Nurse Program (Signed)
  Dept: [phone]   Congregational Nurse Program Note  Date of Encounter: 04/06/2018  Past Medical History: Past Medical History:  Diagnosis Date  . Bipolar 1 disorder (HCC)   . 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 Hypertension     Encounter Details: CNP Questionnaire - 04/07/18 1608      Questionnaire   Patient Status  Not Applicable    Race  Black or African American    Location Patient Served At  Not Applicable    Insurance  Not Applicable    Uninsured  Uninsured (Subsequent visits/quarter)    Food  No food insecurities    Housing/Utilities  No permanent housing    Transportation  Within past 12 months, lack of transportation negatively impacted life    Interpersonal Safety  Yes, feel physically and emotionally safe where you currently live    Medication  No medication insecurities    Medical Provider  Yes    Referrals  Primary Care Provider/Clinic;Behavioral/Mental Health Provider    ED Visit Averted  Not Applicable    Life-Saving Intervention Made  Not Applicable     Client stopped by stating she got out of the hospital on yesterday went in early Monday am had chest pains wasn't sure if it was a stroke,nurse counseled regarding signs of a stroke reviewed chart of FAST signs with client.states her blood sugar and blood pressure was up. Client had eaten dinner and smoked a cigarette.will check blood sugar just to gage where it is after dinner was consumed . Client had not been taking her diabetes medication and off and on taking her blood pressure medication. Couldn't really say why she isn't taking medications. States her family ha sa history of kidney disease and  Dialysis. Ask her if that was her intention since she is not taking her medications. Referred her to [facility] to get an appointment for mammogram and pap smear. Client to call and follow up with nurse tomorrow. [name]

## 2018-04-07 NOTE — Congregational Nurse Program (Signed)
  Dept: [phone]   Congregational Nurse Program Note  Date of Encounter: 04/07/2018  Past Medical History: Past Medical History:  Diagnosis Date  . Bipolar 1 disorder (HCC)   . 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 Hypertension     Encounter Details: CNP Questionnaire - 04/07/18 2254      Questionnaire   Patient Status  Not Applicable    Race  Black or African American    Location Patient Served At  Not Applicable    Insurance  Medicaid    Uninsured  Not Applicable    Food  No food insecurities    Housing/Utilities  No permanent housing    Transportation  Yes, need transportation assistance;Provided transportation assistance (bus pass, taxi voucher, etc.)    Interpersonal Safety  Yes, feel physically and emotionally safe where you currently live    Medication  Yes, have medication insecurities    Medical Provider  No    Referrals  Primary Care Provider/Clinic    ED Visit Averted  Not Applicable    Life-Saving Intervention Made  Not Applicable      Client in before dinner today to have her blood sugar checked states she cant find her glucometer,client had breakfast but no lunch blood sugar was 112 mg  And had only taken her metformin but no insulin today ,felt weak ,nurse counseled again regarding taking medications but also eating meals ,because it was low ask her to eat first ,take her insulin then come back for nurse to recheck her blood sugar and blood pressure. Client informed nurse that she received her medicaid last week cant return to [facility] after she was questioned about a follow up visit to see NP at [facility]. Nurse called new clinic @ [facility] . Appointment given for 04-08-18 @11 :10 am. Client made aware of appointment and counseled regarding need to have her medication evaluated in regards to her diabetes.client went to dinner and never returned to get bus pass and referral for appointment ,case manager and  nurse put referral in her mail box and case manager will make contact with client in am to assure she keeps appointment. Unable to recheck blood sugar and blood pressure no return. Will follow up next week

## 2018-04-08 ENCOUNTER — Ambulatory Visit: Payer: Self-pay | Admitting: Family Medicine

## 2018-04-25 ENCOUNTER — Emergency Department (HOSPITAL_COMMUNITY)
Admission: EM | Admit: 2018-04-25 | Discharge: 2018-04-26 | Disposition: A | Discharge status: Home / Self Care | Payer: Medicaid Other | Attending: Emergency Medicine | Admitting: Emergency Medicine

## 2018-04-25 ENCOUNTER — Other Ambulatory Visit: Payer: Self-pay

## 2018-04-25 ENCOUNTER — Encounter (HOSPITAL_COMMUNITY): Payer: Self-pay | Admitting: Pharmacy Technician

## 2018-04-25 DIAGNOSIS — Z794 Long term (current) use of insulin: Secondary | ICD-10-CM | POA: Diagnosis not present

## 2018-04-25 DIAGNOSIS — I10 Essential (primary) hypertension: Secondary | ICD-10-CM | POA: Diagnosis not present

## 2018-04-25 DIAGNOSIS — Z91040 Latex allergy status: Secondary | ICD-10-CM | POA: Diagnosis not present

## 2018-04-25 DIAGNOSIS — F17210 Nicotine dependence, cigarettes, uncomplicated: Secondary | ICD-10-CM | POA: Diagnosis not present

## 2018-04-25 DIAGNOSIS — M79604 Pain in right leg: Secondary | ICD-10-CM | POA: Diagnosis present

## 2018-04-25 DIAGNOSIS — M79605 Pain in left leg: Secondary | ICD-10-CM | POA: Insufficient documentation

## 2018-04-25 DIAGNOSIS — Z79899 Other long term (current) drug therapy: Secondary | ICD-10-CM | POA: Diagnosis not present

## 2018-04-25 DIAGNOSIS — E119 Type 2 diabetes mellitus without complications: Secondary | ICD-10-CM | POA: Diagnosis not present

## 2018-04-25 LAB — CBC WITH DIFFERENTIAL/PLATELET
ABS IMMATURE GRANULOCYTES: 0.02 10*3/uL (ref 0.00–0.07)
BASOS PCT: 0 %
Basophils Absolute: 0 10*3/uL (ref 0.0–0.1)
EOS PCT: 0 %
Eosinophils Absolute: 0 10*3/uL (ref 0.0–0.5)
HCT: 35.8 % — ABNORMAL LOW (ref 36.0–46.0)
HEMOGLOBIN: 10.5 g/dL — AB (ref 12.0–15.0)
Immature Granulocytes: 0 %
LYMPHS PCT: 18 %
Lymphs Abs: 1.3 10*3/uL (ref 0.7–4.0)
MCH: 24.2 pg — ABNORMAL LOW (ref 26.0–34.0)
MCHC: 29.3 g/dL — ABNORMAL LOW (ref 30.0–36.0)
MCV: 82.7 fL (ref 80.0–100.0)
MONOS PCT: 5 %
Monocytes Absolute: 0.3 10*3/uL (ref 0.1–1.0)
Neutro Abs: 5.3 10*3/uL (ref 1.7–7.7)
Neutrophils Relative %: 77 %
PLATELETS: 338 10*3/uL (ref 150–400)
RBC: 4.33 MIL/uL (ref 3.87–5.11)
RDW: 15.9 % — AB (ref 11.5–15.5)
WBC: 7 10*3/uL (ref 4.0–10.5)
nRBC: 0 % (ref 0.0–0.2)

## 2018-04-25 LAB — I-STAT BETA HCG BLOOD, ED (MC, WL, AP ONLY)

## 2018-04-25 LAB — BASIC METABOLIC PANEL
ANION GAP: 8 (ref 5–15)
BUN: 15 mg/dL (ref 6–20)
CALCIUM: 9.2 mg/dL (ref 8.9–10.3)
CO2: 22 mmol/L (ref 22–32)
Chloride: 105 mmol/L (ref 98–111)
Creatinine, Ser: 0.95 mg/dL (ref 0.44–1.00)
Glucose, Bld: 164 mg/dL — ABNORMAL HIGH (ref 70–99)
Potassium: 3.7 mmol/L (ref 3.5–5.1)
Sodium: 135 mmol/L (ref 135–145)

## 2018-04-25 LAB — CK: Total CK: 108 U/L (ref 38–234)

## 2018-04-25 MED ORDER — KETOROLAC TROMETHAMINE 15 MG/ML IJ SOLN
15.0000 mg | Freq: Once | INTRAMUSCULAR | Status: AC
  Administered 2018-04-26: 15 mg via INTRAMUSCULAR
  Filled 2018-04-25: qty 1

## 2018-04-25 NOTE — ED Triage Notes (Signed)
Pt arrives via POV with bil leg pain starting at the thigh and extending down into both feet. Pain started last night.

## 2018-04-25 NOTE — ED Provider Notes (Signed)
[facility] EMERGENCY DEPARTMENT Provider Note   CSN: [id number] Arrival date & time: 04/25/18  2221     History   Chief Complaint Chief Complaint  Patient presents with  . Leg Pain    HPI Kaitlyn Gray is a 42 y.o. female.  HPI   Patient is a 42 year old female with a history of bipolar 1 disorder, substance abuse, hyperosmolar nonketotic state, T2DM, hypertension, who presents emergency department today for evaluation of bilateral lower extremity pain that she states is been present since yesterday.  States pain comes and goes.  Can happen when she is sitting or moving.  Pain improves with ambulation.  Pain "feels like someone is hammering on my legs ".  States pain starts in her bilateral feet and radiates up to her bilateral thighs.  No lower cavity swelling.  No recent admissions to the hospital.  No recent surgeries or trauma.  No recent extended periods of travel.  Not on estrogen.  No history of DVT PE or cancer.  Patient states she did not take her blood pressure medications today.  She denies chest pain, shortness of breath, abdominal pain, headaches, lightheadedness or dizziness.  No numbness or weakness to the arms or legs.  No vision changes.  Past Medical History:  Diagnosis Date  . Bipolar 1 disorder ([place])   . 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 ([place]) 07/19/2017  . Hypertension     Patient Active Problem List   Diagnosis Date Noted  . Diabetes mellitus without complication ([place]) 40/98/1191  . Hypokalemia 12/06/2017  . Hypertension 12/06/2017  . 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 ([place]) 07/19/2017  . Bipolar disorder ([place]) 07/19/2017  . Polysubstance abuse ([place]) 07/19/2017  . Chest pain 07/19/2017  . Cocaine use disorder, severe, dependence ([place]) 03/24/2015  . Cannabis use disorder, moderate, dependence ([place]) 03/24/2015  . MDD (major depressive disorder), recurrent  severe, without psychosis ([place]) 03/24/2015    Past Surgical History:  Procedure Laterality Date  . CESAREAN SECTION       OB History   None      Home Medications    Prior to Admission medications   Medication Sig Start Date End Date Taking? Authorizing Provider  blood glucose meter kit and supplies KIT Dispense based on patient and insurance preference. Use up to four times daily as directed. (FOR ICD-9 250.00, 250.01). Please give 3 months supply of lancets and test strips to allow her to check QID. 12/07/17   Roxan Hockey, MD  blood glucose meter kit and supplies Relion Prime or Dispense other brand based on patient and insurance preference. Use up to four times daily as directed. (FOR ICD-9 250.00, 250.01). 12/07/17   Roxan Hockey, MD  Insulin Glargine (LANTUS SOLOSTAR) 100 UNIT/ML Solostar Pen Inject 45 Units into the skin every morning. 12/07/17   Emokpae, Courage, MD  insulin lispro (HUMALOG KWIKPEN) 100 UNIT/ML KiwkPen Inject 0-0.2 mLs (0-20 Units total) into the skin 4 (four) times daily -  before meals and at bedtime. Inject 0-20 Units into the skin 3 (three) times daily with meals. CBG 70 - 120: 0 units  CBG 121 - 150: 3 units  CBG 151 - 200: 4 units  CBG 201 - 250: 7 units  CBG 251 - 300: 11 units  CBG 301 - 350: 15 units  CBG 351 - 400: 20 units 12/07/17   Emokpae, Courage, MD  Insulin Pen Needle 31G X 5 MM MISC Use  as directed 12/07/17   Roxan Hockey, MD  metFORMIN (GLUCOPHAGE) 1000 MG tablet Take 1 tablet (1,000 mg total) by mouth 2 (two) times daily with a meal. 12/07/17 12/07/18  Roxan Hockey, MD  methocarbamol (ROBAXIN) 500 MG tablet Take 1 tablet (500 mg total) by mouth 2 (two) times daily for 5 days. 04/26/18 05/01/18  Lorenna Lurry S, PA-C  naproxen (NAPROSYN) 375 MG tablet Take 1 tablet (375 mg total) by mouth 2 (two) times daily with a meal for 5 days. 04/26/18 05/01/18  Ashtin Rosner S, PA-C    Family History Family History  Problem Relation  Age of Onset  . Hypertension Mother   . CAD Mother 70       died of MI at age 47  . Hypertension Father     Social History Social History   Tobacco Use  . Smoking status: Current Every Day Smoker    Packs/day: 0.50    Types: Cigarettes  . Smokeless tobacco: Never Used  Substance Use Topics  . Alcohol use: No  . Drug use: Yes    Types: Marijuana, Cocaine     Allergies   Hydrocodone and Latex   Review of Systems Review of Systems  Constitutional: Negative for chills and fever.  HENT: Negative for congestion.   Eyes: Negative for visual disturbance.  Respiratory: Negative for cough and shortness of breath.   Cardiovascular: Negative for chest pain and leg swelling.  Gastrointestinal: Positive for diarrhea. Negative for abdominal pain, constipation, nausea and vomiting.  Genitourinary: Negative for dysuria and urgency.  Musculoskeletal: Negative for back pain.       BLE pain, chronic back pain  Skin: Negative for rash.  Neurological: Negative for weakness, numbness and headaches.     Physical Exam Updated Vital Signs BP (!) 168/117   Pulse 94   Temp 98.6 F (37 C) (Oral)   Resp (!) 25   Ht '5\' 3"'  (1.6 m)   Wt 90.7 kg   SpO2 99%   BMI 35.43 kg/m   Physical Exam  Constitutional: She appears well-developed and well-nourished. No distress.  HENT:  Head: Normocephalic and atraumatic.  Eyes: Conjunctivae are normal.  Neck: Neck supple.  Cardiovascular: Normal rate and regular rhythm.  No murmur heard. Pulmonary/Chest: Effort normal and breath sounds normal. No respiratory distress.  Abdominal: Soft. There is no tenderness.  Musculoskeletal: She exhibits no edema.  Tenderness to the posterior aspect of the upper and lower legs bilaterally.  No significant tenderness anteriorly.  No lower extremity edema.  Distal pulses intact.  Normal sensation.  Strength is grossly normal and intact bilaterally though there is decreased effort from the patient.  She is  ambulatory with steady gait.  Neurological: She is alert.  Skin: Skin is warm and dry.  Psychiatric: She has a normal mood and affect.  Nursing note and vitals reviewed.  ED Treatments / Results  Labs (all labs ordered are listed, but only abnormal results are displayed) Labs Reviewed  CBC WITH DIFFERENTIAL/PLATELET - Abnormal; Notable for the following components:      Result Value   Hemoglobin 10.5 (*)    HCT 35.8 (*)    MCH 24.2 (*)    MCHC 29.3 (*)    RDW 15.9 (*)    All other components within normal limits  BASIC METABOLIC PANEL - Abnormal; Notable for the following components:   Glucose, Bld 164 (*)    All other components within normal limits  CK  I-STAT BETA HCG BLOOD, ED (  [facility], [facility], [facility] ONLY)    EKG None  Radiology No results found.  Procedures Procedures (including critical care time)  Medications Ordered in ED Medications  ketorolac (TORADOL) 15 MG/ML injection 15 mg (has no administration in time range)     Initial Impression / Assessment and Plan / ED Course  I have reviewed the triage vital signs and the nursing notes.  Pertinent labs & imaging results that were available during my care of the patient were reviewed by me and considered in my medical decision making (see chart for details).    Final Clinical Impressions(s) / ED Diagnoses   Final diagnoses:  Bilateral leg pain   Pt presenting with bilat lower extremity pain that has been ongoing for 24 hours. Pain to palpation to posterior aspect of ble. No swelling to suggest dvt. Pt does not have risk factors for dvt pe and I have very low suspicion for this. No obvious signs of trauma on exam. Normal strength and sensation. Ambulatory with steady gait. Has chronic back pain that is not worse today. Doubt that this is related and she has no red flag signs/sxs of back pain. Basic labs obtained which are reassuring and at baseline for patient. Ck is negative and therefore doubt rhabdomyolysis. Pt given  pain medication in the ED with some improvement of sxs. Will give rx for muscle relaxers and naproxen and have her f/u with pcp. Advised her to return if worse. All questions answered.  ED Discharge Orders         Ordered    methocarbamol (ROBAXIN) 500 MG tablet  2 times daily     04/26/18 0023    naproxen (NAPROSYN) 375 MG tablet  2 times daily with meals     04/26/18 0023           Kenyen Candy S, PA-C 04/26/18 0023    Duffy Bruce, MD 04/27/18 0502

## 2018-04-26 MED ORDER — METHOCARBAMOL 500 MG PO TABS: 500.0000 mg | ORAL_TABLET | Freq: Two times a day (BID) | ORAL | 0 refills | Status: AC

## 2018-04-26 MED ORDER — NAPROXEN 375 MG PO TABS: 375.0000 mg | ORAL_TABLET | Freq: Two times a day (BID) | ORAL | 0 refills | Status: AC

## 2018-04-26 NOTE — Discharge Instructions (Signed)

## 2018-04-27 DIAGNOSIS — E0800 Diabetes mellitus due to underlying condition with hyperosmolarity without nonketotic hyperglycemic-hyperosmolar coma (NKHHC): Secondary | ICD-10-CM

## 2018-04-27 LAB — GLUCOSE, POCT (MANUAL RESULT ENTRY): POC Glucose: 225 mg/dl (ref 70–99)

## 2018-04-27 NOTE — Congregational Nurse Program (Signed)
  Dept: [phone]   Congregational Nurse Program Note  Date of Encounter: 04/27/2018  Past Medical History: Past Medical History:  Diagnosis Date  . Bipolar 1 disorder (HCC)   . 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 Hypertension     Encounter Details: CNP Questionnaire - 04/27/18 1807      Questionnaire   Patient Status  Not Applicable    Race  Black or African American    Location Patient Served At  Not Applicable    Insurance  Medicaid    Uninsured  Not Applicable    Food  No food insecurities    Housing/Utilities  No permanent housing    Transportation  Yes, need transportation assistance;Within past 12 months, lack of transportation negatively impacted life    Interpersonal Safety  Yes, feel physically and emotionally safe where you currently live    Medication  No medication insecurities    Medical Provider  No    Referrals  Primary Care Provider/Clinic;Behavioral/Mental Health Provider;Area Agency    ED Visit Averted  Not Applicable    Life-Saving Intervention Made  Not Applicable     Client in today upset that school has called her and is demanding she take her son to behavior health for his behavior at school after talking to her and listening child has been acting out for over 1 month ,states he doesn't like reading starts to hell and run about the class room ,has child been tested any resources available to him ,nurse will  contact school nurse ,mother states her daughter does well in school ,counseled regarding differences in children and signs of a child needing help in dealing with his own stresses ,mother not compliant with her care ,not sure she is taking her medications ,blood pressure up ask if she was taking her medications ,response not clear ,states she is depressed, didn't keep appointment at clinic ,hasnt rescheduled ,gave mother the referral to call again , on the phone while trying to talk to  her,distracted , blood sugar up , not sure she has taken medications ,ask her to get medication and allow me to observe her take it she states I will take it. Case manager to take mother and son to behavior health this pm , Spoke with case managers and states at school son threaten to harm himself  ,immediate follow up needed ,mother did not tell nurse that . [name] Follow up with family tomorrow ask mother to come in and follow up on behavior health and her vital signs and blood sugar.   States she was in hospital Sunday and Monday for chest pains . Will review record.

## 2018-04-28 ENCOUNTER — Telehealth: Payer: Self-pay

## 2018-04-28 NOTE — Telephone Encounter (Signed)
TC to school nurse to determine if resources for child have been identified, in return school assistant  Principal called and we discussed issues and a release of information form was sent to school in order to share information in order to assist family. Mother was suppose to follow up with nurse today no return ,case manager states child was hospitalized .

## 2018-05-06 ENCOUNTER — Encounter (HOSPITAL_COMMUNITY): Payer: Self-pay | Admitting: Emergency Medicine

## 2018-05-06 ENCOUNTER — Other Ambulatory Visit: Payer: Self-pay

## 2018-05-06 ENCOUNTER — Emergency Department (HOSPITAL_COMMUNITY)
Admission: EM | Admit: 2018-05-06 | Discharge: 2018-05-06 | Discharge status: Against Medical Advice | Payer: Medicaid Other | Attending: Emergency Medicine | Admitting: Emergency Medicine

## 2018-05-06 DIAGNOSIS — E119 Type 2 diabetes mellitus without complications: Secondary | ICD-10-CM | POA: Insufficient documentation

## 2018-05-06 DIAGNOSIS — F17210 Nicotine dependence, cigarettes, uncomplicated: Secondary | ICD-10-CM | POA: Insufficient documentation

## 2018-05-06 DIAGNOSIS — Z794 Long term (current) use of insulin: Secondary | ICD-10-CM | POA: Insufficient documentation

## 2018-05-06 DIAGNOSIS — R42 Dizziness and giddiness: Secondary | ICD-10-CM | POA: Diagnosis not present

## 2018-05-06 DIAGNOSIS — Z5329 Procedure and treatment not carried out because of patient's decision for other reasons: Secondary | ICD-10-CM | POA: Diagnosis not present

## 2018-05-06 DIAGNOSIS — I10 Essential (primary) hypertension: Secondary | ICD-10-CM | POA: Insufficient documentation

## 2018-05-06 DIAGNOSIS — M542 Cervicalgia: Secondary | ICD-10-CM

## 2018-05-06 LAB — CBC WITH DIFFERENTIAL/PLATELET
Abs Immature Granulocytes: 0.01 10*3/uL (ref 0.00–0.07)
BASOS ABS: 0 10*3/uL (ref 0.0–0.1)
Basophils Relative: 1 %
EOS PCT: 2 %
Eosinophils Absolute: 0.1 10*3/uL (ref 0.0–0.5)
HEMATOCRIT: 35.4 % — AB (ref 36.0–46.0)
Hemoglobin: 10.1 g/dL — ABNORMAL LOW (ref 12.0–15.0)
IMMATURE GRANULOCYTES: 0 %
LYMPHS ABS: 2.4 10*3/uL (ref 0.7–4.0)
LYMPHS PCT: 45 %
MCH: 24.2 pg — ABNORMAL LOW (ref 26.0–34.0)
MCHC: 28.5 g/dL — ABNORMAL LOW (ref 30.0–36.0)
MCV: 84.7 fL (ref 80.0–100.0)
MONOS PCT: 6 %
Monocytes Absolute: 0.3 10*3/uL (ref 0.1–1.0)
NEUTROS PCT: 46 %
NRBC: 0 % (ref 0.0–0.2)
Neutro Abs: 2.4 10*3/uL (ref 1.7–7.7)
Platelets: 328 10*3/uL (ref 150–400)
RBC: 4.18 MIL/uL (ref 3.87–5.11)
RDW: 16.1 % — ABNORMAL HIGH (ref 11.5–15.5)
WBC: 5.3 10*3/uL (ref 4.0–10.5)

## 2018-05-06 LAB — BASIC METABOLIC PANEL
Anion gap: 7 (ref 5–15)
BUN: 11 mg/dL (ref 6–20)
CO2: 26 mmol/L (ref 22–32)
Calcium: 9.1 mg/dL (ref 8.9–10.3)
Chloride: 106 mmol/L (ref 98–111)
Creatinine, Ser: 0.91 mg/dL (ref 0.44–1.00)
GFR calc Af Amer: >60 mL/min (ref >60)
GFR calc non Af Amer: >60 mL/min (ref >60)
Glucose, Bld: 140 mg/dL — ABNORMAL HIGH (ref 70–99)
Potassium: 3.8 mmol/L (ref 3.5–5.1)
Sodium: 139 mmol/L (ref 135–145)

## 2018-05-06 LAB — I-STAT CHEM 8, ED
BUN: 12 mg/dL (ref 6–20)
CREATININE: 0.9 mg/dL (ref 0.44–1.00)
Calcium, Ion: 1.12 mmol/L — ABNORMAL LOW (ref 1.15–1.40)
Chloride: 105 mmol/L (ref 98–111)
Glucose, Bld: 136 mg/dL — ABNORMAL HIGH (ref 70–99)
HEMATOCRIT: 33 % — AB (ref 36.0–46.0)
Hemoglobin: 11.2 g/dL — ABNORMAL LOW (ref 12.0–15.0)
POTASSIUM: 3.8 mmol/L (ref 3.5–5.1)
Sodium: 141 mmol/L (ref 135–145)
TCO2: 28 mmol/L (ref 22–32)

## 2018-05-06 LAB — CBG MONITORING, ED: Glucose-Capillary: 134 mg/dL — ABNORMAL HIGH (ref 70–99)

## 2018-05-06 NOTE — ED Notes (Signed)
Pt adamant about leaving. Pt stated "I am not sitting in no hallway bed." Staff members explained to pt about being moved into hallway and pt still refused to be moved and stating she wants to leave. Provider notified. Pt signed AMA form. Pt ambulatory to waiting room.

## 2018-05-06 NOTE — ED Provider Notes (Signed)
[facility] EMERGENCY DEPARTMENT Provider Note   CSN: [id number] Arrival date & time: 05/06/18  1221     History   Chief Complaint Chief Complaint  Patient presents with  . Dizziness    HPI Kaitlyn Gray is a 42 y.o. female who presents with lightheadedness and near syncope. PMH significant for bipolar d/o, IDDM, HTN, polysubstance abuse, chronic neck pain. She states that she was at her son's school and she stood up and had severe posterior neck pain and felt dizzy and lightheaded like she was going to pass out.  People at the school tried to help her and called EMS.  She told EMS that she was having chest pain and shortness of breath and when they tried to give her an IV she refused.  She states that she does not want an IV just hanging out in her arm unless she was getting treatment.  Her blood sugar was normal.  Her blood pressure was elevated.  She reports taking her blood pressure medicine today.  She states that she is concerned about her neck because it was fractured back in July and she did not wear the neck brace as long as she was supposed to and never followed up with her provider regarding her neck.  She denies any additional trauma to the neck.  No numbness or tingling or arm weakness.  She states that she has had similar episodes of chest pain multiple times in the past and has been seen in the ED for this and told it was not her heart.  She reports ongoing leg cramping which she was seen for on 11/11 and prescribed muscle relaxers.  She states that this sometimes helps.  She also has urinary frequency.  She denies dysuria.  HPI  Past Medical History:  Diagnosis Date  . Bipolar 1 disorder ([place])   . 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 ([place]) 07/19/2017  . Hypertension     Patient Active Problem List   Diagnosis Date Noted  . Diabetes mellitus without complication ([place]) 93/71/6967  . Hypokalemia 12/06/2017  .  Hypertension 12/06/2017  . 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 ([place]) 07/19/2017  . Bipolar disorder ([place]) 07/19/2017  . Polysubstance abuse ([place]) 07/19/2017  . Chest pain 07/19/2017  . Cocaine use disorder, severe, dependence ([place]) 03/24/2015  . Cannabis use disorder, moderate, dependence ([place]) 03/24/2015  . MDD (major depressive disorder), recurrent severe, without psychosis ([place]) 03/24/2015    Past Surgical History:  Procedure Laterality Date  . CESAREAN SECTION       OB History   None      Home Medications    Prior to Admission medications   Medication Sig Start Date End Date Taking? Authorizing Provider  blood glucose meter kit and supplies KIT Dispense based on patient and insurance preference. Use up to four times daily as directed. (FOR ICD-9 250.00, 250.01). Please give 3 months supply of lancets and test strips to allow her to check QID. 12/07/17   Roxan Hockey, MD  blood glucose meter kit and supplies Relion Prime or Dispense other brand based on patient and insurance preference. Use up to four times daily as directed. (FOR ICD-9 250.00, 250.01). 12/07/17   Roxan Hockey, MD  Insulin Glargine (LANTUS SOLOSTAR) 100 UNIT/ML Solostar Pen Inject 45 Units into the skin every morning. 12/07/17   Emokpae, Courage, MD  insulin lispro (HUMALOG KWIKPEN) 100 UNIT/ML KiwkPen Inject 0-0.2 mLs (0-20 Units total)  into the skin 4 (four) times daily -  before meals and at bedtime. Inject 0-20 Units into the skin 3 (three) times daily with meals. CBG 70 - 120: 0 units  CBG 121 - 150: 3 units  CBG 151 - 200: 4 units  CBG 201 - 250: 7 units  CBG 251 - 300: 11 units  CBG 301 - 350: 15 units  CBG 351 - 400: 20 units 12/07/17   Emokpae, Courage, MD  Insulin Pen Needle 31G X 5 MM MISC Use as directed 12/07/17   Roxan Hockey, MD  metFORMIN (GLUCOPHAGE) 1000 MG tablet Take 1 tablet (1,000 mg total) by mouth 2 (two) times daily with a meal. 12/07/17  12/07/18  Roxan Hockey, MD    Family History Family History  Problem Relation Age of Onset  . Hypertension Mother   . CAD Mother 35       died of MI at age 47  . Hypertension Father     Social History Social History   Tobacco Use  . Smoking status: Current Every Day Smoker    Packs/day: 0.50    Types: Cigarettes  . Smokeless tobacco: Never Used  Substance Use Topics  . Alcohol use: No  . Drug use: Yes    Types: Marijuana, Cocaine     Allergies   Hydrocodone and Latex   Review of Systems Review of Systems  Constitutional: Positive for fatigue. Negative for chills and fever.  Respiratory: Negative for shortness of breath.   Cardiovascular: Positive for chest pain (Resolved).  Gastrointestinal: Negative for abdominal pain, nausea and vomiting.  Endocrine: Positive for polyuria.  Genitourinary: Positive for frequency. Negative for dysuria.  Musculoskeletal: Positive for myalgias and neck pain.  Neurological: Positive for dizziness and light-headedness. Negative for syncope, weakness and headaches.  All other systems reviewed and are negative.    Physical Exam Updated Vital Signs BP (!) 142/90   Pulse 65   Resp 20   Ht _0  (1.6 m)   Wt 93 kg   LMP 04/29/2018 (Within Days)   SpO2 100%   BMI 36.31 kg/m   Physical Exam  Constitutional: She is oriented to person, place, and time. She appears well-developed and well-nourished. No distress.  Calm and cooperative  HENT:  Head: Normocephalic and atraumatic.  Eyes: Pupils are equal, round, and reactive to light. Conjunctivae are normal. Right eye exhibits no discharge. Left eye exhibits no discharge. No scleral icterus.  Neck: Normal range of motion.  Tenderness over C6-C7 with associated paraspinal muscle tenderness and trapezius tenderness  Cardiovascular: Normal rate and regular rhythm.  Pulmonary/Chest: Effort normal and breath sounds normal. No respiratory distress.  Abdominal: Soft. Bowel sounds are  normal. She exhibits no distension. There is no tenderness.  Musculoskeletal:  No peripheral edema  Neurological: She is alert and oriented to person, place, and time.  Skin: Skin is warm and dry.  Psychiatric: She has a normal mood and affect. Her behavior is normal.  Nursing note and vitals reviewed.    ED Treatments / Results  Labs (all labs ordered are listed, but only abnormal results are displayed) Labs Reviewed  CBG MONITORING, ED - Abnormal; Notable for the following components:      Result Value   Glucose-Capillary 134 (*)    All other components within normal limits  I-STAT CHEM 8, ED - Abnormal; Notable for the following components:   Glucose, Bld 136 (*)    Calcium, Ion 1.12 (*)    Hemoglobin 11.2 (*)  HCT 33.0 (*)    All other components within normal limits  BASIC METABOLIC PANEL  CBC WITH DIFFERENTIAL/PLATELET  I-STAT BETA HCG BLOOD, ED ([facility], [facility], [facility] ONLY)    EKG None  Radiology No results found.  Procedures Procedures (including critical care time)  Medications Ordered in ED Medications - No data to display   Initial Impression / Assessment and Plan / ED Course  I have reviewed the triage vital signs and the nursing notes.  Pertinent labs & imaging results that were available during my care of the patient were reviewed by me and considered in my medical decision making (see chart for details).  42 year old female presents with near syncopal episode after severe episode of neck pain and standing up while at her son's school.  She is mildly hypertensive but otherwise vital signs are normal here.  She is calm and cooperative on initial exam and in no acute distress.  She has mild tenderness of the neck.  Heart is normal rate and rhythm.  Lungs are clear to auscultation.  Abdomen is soft and nontender.  EKG is normal sinus rhythm.  Because she is still feeling lightheaded recommended IV fluid bolus and check of basic lab work.  Also agreed to CT of the  cervical spine to reevaluate her fracture since she does not have close follow-up.  The patient agreed to this plan.  I was informed by nursing that the patient became angry once she was moved to a progression bed and decided to leave the hospital.  She left prior to reevaluation. I-stat chem 8 shows mild hyperglycemia and mild anemia.  Final Clinical Impressions(s) / ED Diagnoses   Final diagnoses:  Lightheaded  Neck pain    ED Discharge Orders    None       Recardo Evangelist, PA-C 05/06/18 [place], MD 05/08/18 [phone]

## 2018-05-06 NOTE — ED Triage Notes (Signed)
Pt reports dizziness and blurred vision starting at 9am. Reports R sided shoulder and neck pain. Pt called 911 from son's school "because I was gonna fall out." Pt reported chest pain and shortness of breath with EMS but refused their treatment, uncooperative. EMS gave 324mg  aspirin, no nitro as pt refused IV access.

## 2018-05-12 ENCOUNTER — Other Ambulatory Visit: Payer: Self-pay

## 2018-05-12 ENCOUNTER — Observation Stay (HOSPITAL_COMMUNITY)
Admission: EM | Admit: 2018-05-12 | Discharge: 2018-05-13 | Disposition: A | Discharge status: Home / Self Care | Payer: Medicaid Other | Attending: Internal Medicine | Admitting: Internal Medicine

## 2018-05-12 ENCOUNTER — Encounter (HOSPITAL_COMMUNITY): Payer: Self-pay | Admitting: Emergency Medicine

## 2018-05-12 ENCOUNTER — Emergency Department (HOSPITAL_COMMUNITY): Payer: Medicaid Other

## 2018-05-12 DIAGNOSIS — G8929 Other chronic pain: Secondary | ICD-10-CM

## 2018-05-12 DIAGNOSIS — F319 Bipolar disorder, unspecified: Secondary | ICD-10-CM | POA: Insufficient documentation

## 2018-05-12 DIAGNOSIS — Z885 Allergy status to narcotic agent status: Secondary | ICD-10-CM

## 2018-05-12 DIAGNOSIS — E119 Type 2 diabetes mellitus without complications: Secondary | ICD-10-CM | POA: Diagnosis not present

## 2018-05-12 DIAGNOSIS — Z590 Homelessness: Secondary | ICD-10-CM | POA: Diagnosis not present

## 2018-05-12 DIAGNOSIS — F1910 Other psychoactive substance abuse, uncomplicated: Secondary | ICD-10-CM

## 2018-05-12 DIAGNOSIS — R55 Syncope and collapse: Secondary | ICD-10-CM | POA: Diagnosis present

## 2018-05-12 DIAGNOSIS — T43591A Poisoning by other antipsychotics and neuroleptics, accidental (unintentional), initial encounter: Secondary | ICD-10-CM | POA: Diagnosis not present

## 2018-05-12 DIAGNOSIS — Z79899 Other long term (current) drug therapy: Secondary | ICD-10-CM

## 2018-05-12 DIAGNOSIS — Z794 Long term (current) use of insulin: Secondary | ICD-10-CM | POA: Diagnosis not present

## 2018-05-12 DIAGNOSIS — I10 Essential (primary) hypertension: Secondary | ICD-10-CM | POA: Diagnosis not present

## 2018-05-12 DIAGNOSIS — F17210 Nicotine dependence, cigarettes, uncomplicated: Secondary | ICD-10-CM | POA: Insufficient documentation

## 2018-05-12 DIAGNOSIS — Z91040 Latex allergy status: Secondary | ICD-10-CM | POA: Insufficient documentation

## 2018-05-12 DIAGNOSIS — G92 Toxic encephalopathy: Secondary | ICD-10-CM

## 2018-05-12 DIAGNOSIS — F17200 Nicotine dependence, unspecified, uncomplicated: Secondary | ICD-10-CM

## 2018-05-12 DIAGNOSIS — G9340 Encephalopathy, unspecified: Secondary | ICD-10-CM | POA: Diagnosis not present

## 2018-05-12 DIAGNOSIS — R4182 Altered mental status, unspecified: Secondary | ICD-10-CM

## 2018-05-12 DIAGNOSIS — M542 Cervicalgia: Secondary | ICD-10-CM

## 2018-05-12 HISTORY — DX: Encephalopathy, unspecified: G93.40

## 2018-05-12 LAB — CBC WITH DIFFERENTIAL/PLATELET
Abs Immature Granulocytes: 0.01 10*3/uL (ref 0.00–0.07)
BASOS ABS: 0 10*3/uL (ref 0.0–0.1)
BASOS PCT: 1 %
EOS ABS: 0.1 10*3/uL (ref 0.0–0.5)
Eosinophils Relative: 2 %
HCT: 37.3 % (ref 36.0–46.0)
Hemoglobin: 10.8 g/dL — ABNORMAL LOW (ref 12.0–15.0)
IMMATURE GRANULOCYTES: 0 %
Lymphocytes Relative: 28 %
Lymphs Abs: 1.5 10*3/uL (ref 0.7–4.0)
MCH: 24.4 pg — ABNORMAL LOW (ref 26.0–34.0)
MCHC: 29 g/dL — ABNORMAL LOW (ref 30.0–36.0)
MCV: 84.2 fL (ref 80.0–100.0)
Monocytes Absolute: 0.5 10*3/uL (ref 0.1–1.0)
Monocytes Relative: 9 %
NEUTROS PCT: 60 %
NRBC: 0 % (ref 0.0–0.2)
Neutro Abs: 3.3 10*3/uL (ref 1.7–7.7)
PLATELETS: 307 10*3/uL (ref 150–400)
RBC: 4.43 MIL/uL (ref 3.87–5.11)
RDW: 16.3 % — AB (ref 11.5–15.5)
WBC: 5.5 10*3/uL (ref 4.0–10.5)

## 2018-05-12 LAB — COMPREHENSIVE METABOLIC PANEL
ALBUMIN: 3.6 g/dL (ref 3.5–5.0)
ALBUMIN: 3.4 g/dL — AB (ref 3.5–5.0)
ALT: 15 U/L (ref 0–44)
ALT: 12 U/L (ref 0–44)
ANION GAP: 9 (ref 5–15)
AST: 14 U/L — AB (ref 15–41)
AST: 16 U/L (ref 15–41)
Alkaline Phosphatase: 65 U/L (ref 38–126)
Alkaline Phosphatase: 64 U/L (ref 38–126)
Anion gap: 9 (ref 5–15)
BILIRUBIN TOTAL: 0.2 mg/dL — AB (ref 0.3–1.2)
BILIRUBIN TOTAL: 0.4 mg/dL (ref 0.3–1.2)
BUN: 13 mg/dL (ref 6–20)
BUN: 10 mg/dL (ref 6–20)
CHLORIDE: 102 mmol/L (ref 98–111)
CO2: 25 mmol/L (ref 22–32)
CO2: 27 mmol/L (ref 22–32)
CREATININE: 1 mg/dL (ref 0.44–1.00)
Calcium: 9.4 mg/dL (ref 8.9–10.3)
Calcium: 9.1 mg/dL (ref 8.9–10.3)
Chloride: 103 mmol/L (ref 98–111)
Creatinine, Ser: 0.98 mg/dL (ref 0.44–1.00)
GFR calc Af Amer: >60 mL/min (ref >60)
GFR calc non Af Amer: >60 mL/min (ref >60)
GLUCOSE: 150 mg/dL — AB (ref 70–99)
Glucose, Bld: 303 mg/dL — ABNORMAL HIGH (ref 70–99)
POTASSIUM: 4 mmol/L (ref 3.5–5.1)
Potassium: 3.5 mmol/L (ref 3.5–5.1)
SODIUM: 138 mmol/L (ref 135–145)
Sodium: 137 mmol/L (ref 135–145)
TOTAL PROTEIN: 7 g/dL (ref 6.5–8.1)
Total Protein: 6.7 g/dL (ref 6.5–8.1)

## 2018-05-12 LAB — HEMOGLOBIN A1C
Hgb A1c MFr Bld: 7.1 % — ABNORMAL HIGH (ref 4.8–5.6)
Mean Plasma Glucose: 157.07 mg/dL

## 2018-05-12 LAB — I-STAT BETA HCG BLOOD, ED (MC, WL, AP ONLY): I-stat hCG, quantitative: <5 m[IU]/mL (ref <5)

## 2018-05-12 LAB — ETHANOL

## 2018-05-12 LAB — MAGNESIUM
MAGNESIUM: 2.1 mg/dL (ref 1.7–2.4)
MAGNESIUM: 2 mg/dL (ref 1.7–2.4)

## 2018-05-12 LAB — SALICYLATE LEVEL: Salicylate Lvl: <7 mg/dL (ref 2.8–30.0)

## 2018-05-12 LAB — GLUCOSE, CAPILLARY: Glucose-Capillary: 240 mg/dL — ABNORMAL HIGH (ref 70–99)

## 2018-05-12 LAB — ACETAMINOPHEN LEVEL: Acetaminophen (Tylenol), Serum: <10 ug/mL — ABNORMAL LOW (ref 10–30)

## 2018-05-12 IMAGING — CT CT HEAD W/O CM
4 series · 16 of 47 positions shown, 18 images · non-contrast
Comparison: CT head [DATE]

CLINICAL DATA: Altered level of consciousness

EXAM:
CT HEAD WITHOUT CONTRAST
TECHNIQUE: Contiguous axial images were obtained from the base of the skull
through the vertex without intravenous contrast.

[Series 3: head wo · axial · 0.42mm/px · z∈[-188,-58]mm · 7 of 36 slices shown, 9 images]
[im 5/36  brain]
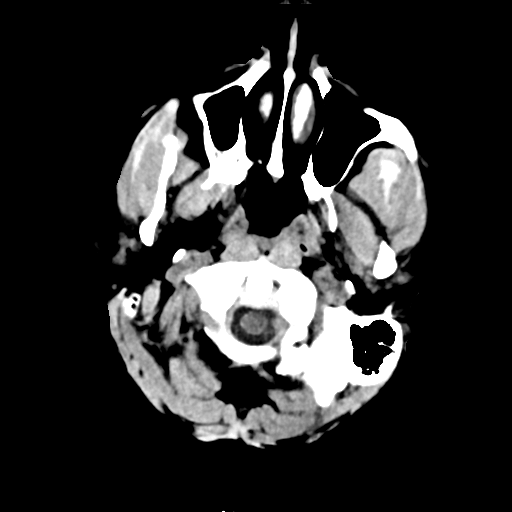
[im 5/36  bone]
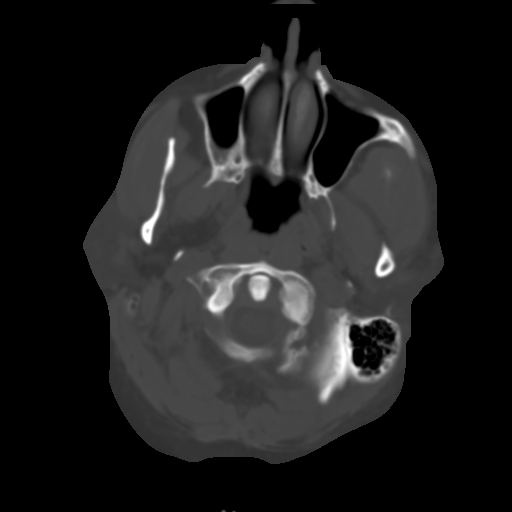
[im 9/36  brain]
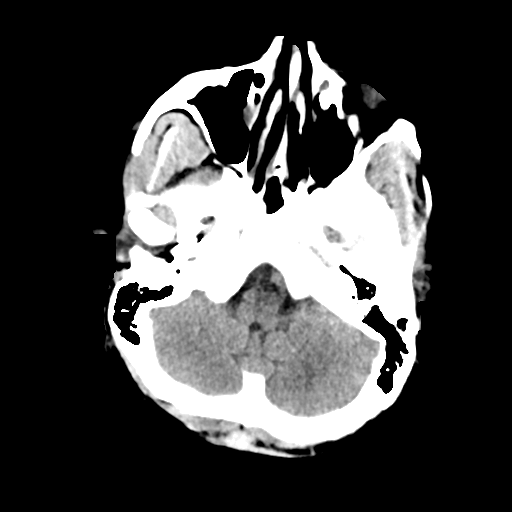
[im 14/36  brain]
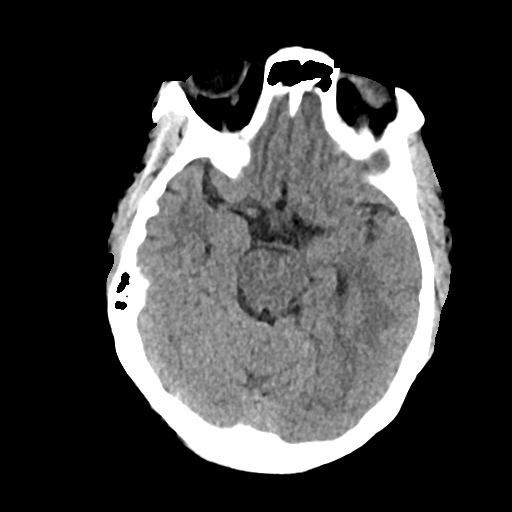
[im 18/36  brain]
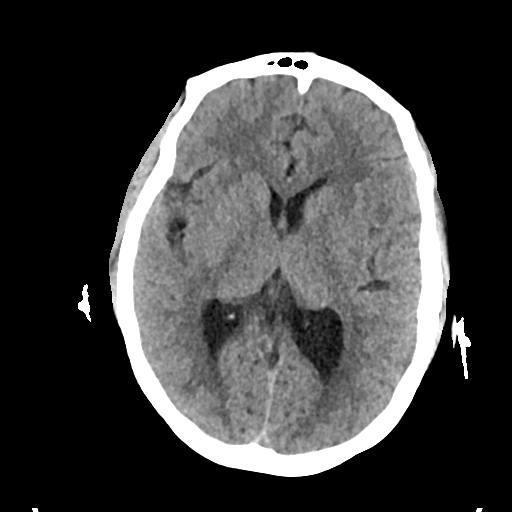
[im 22/36  brain]
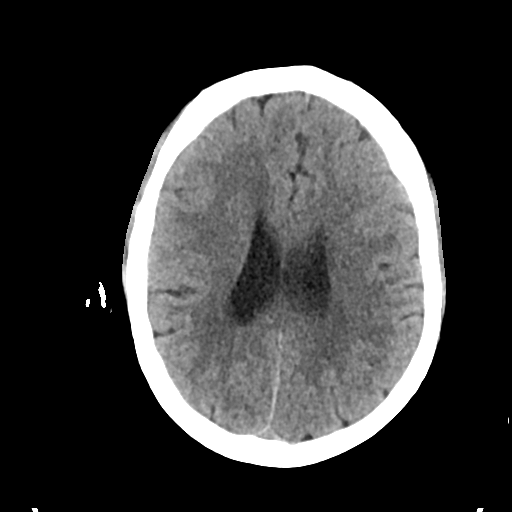
[im 22/36  bone]
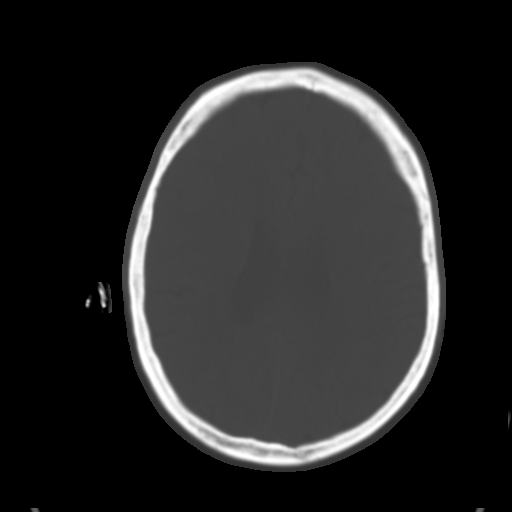
[im 27/36  brain]
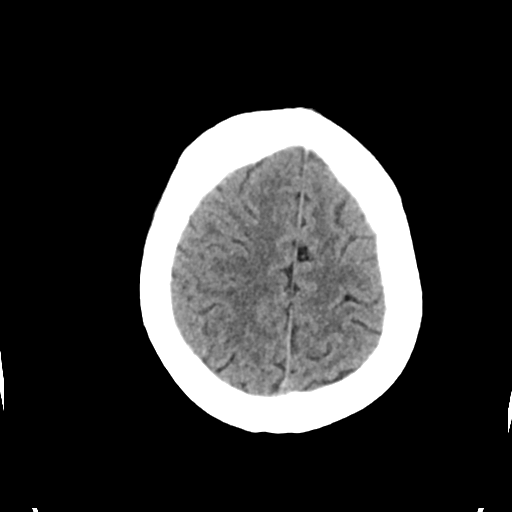
[im 31/36  brain]
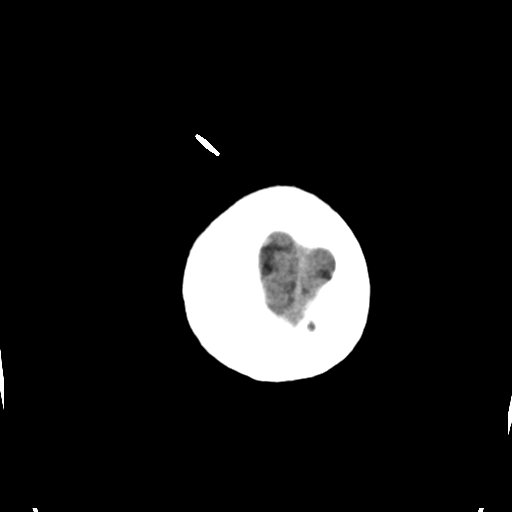

[Series 4: head bone · axial · 0.42mm/px · z∈[-192,-156]mm · 3 of 89 slices shown]
[im 9/89  bone]
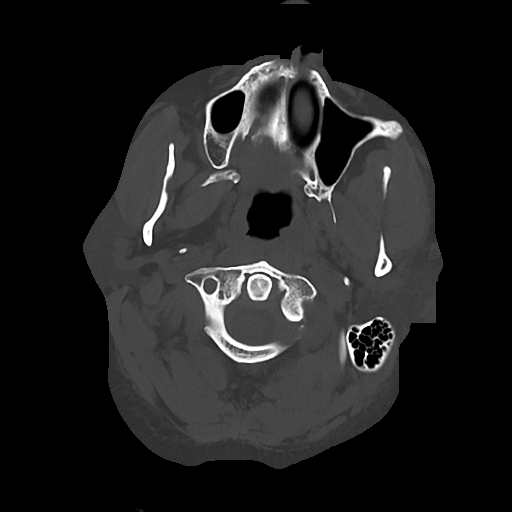
[im 18/89  bone]
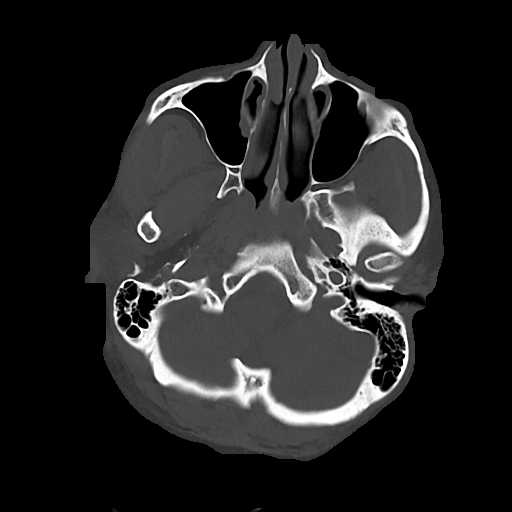
[im 27/89  bone]
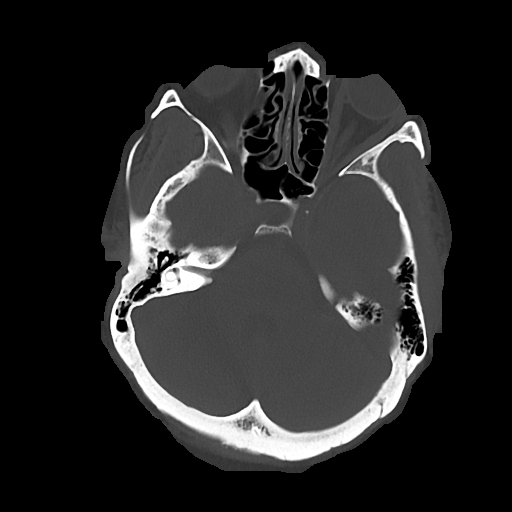

[Series 5: cor soft · coronal · 0.35mm/px · 3 of 67 slices shown]
[im 23/67  brain]
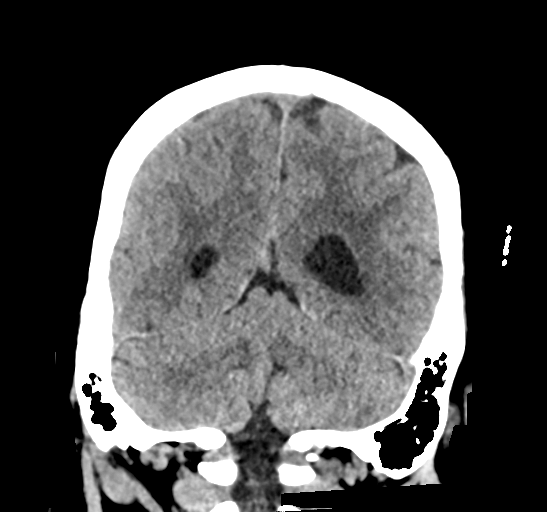
[im 30/67  brain]
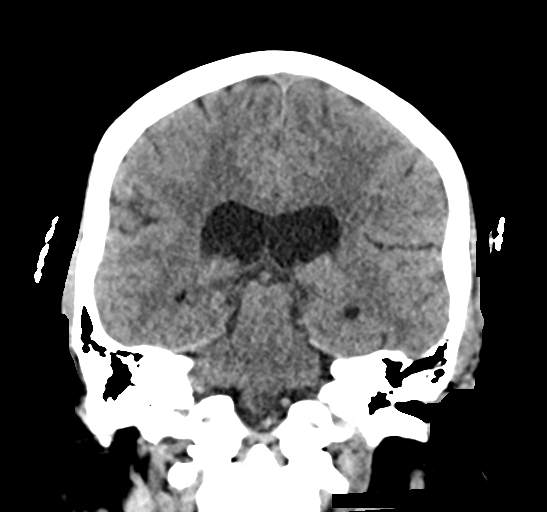
[im 37/67  brain]
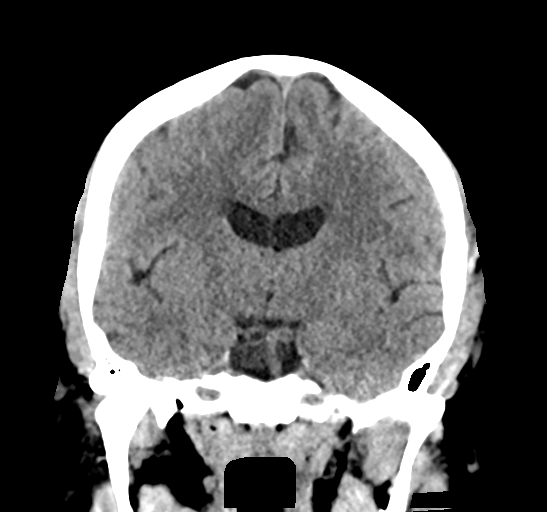

[Series 6: sag soft · sagittal · 0.35mm/px · 3 of 67 slices shown]
[im 24/67  brain]
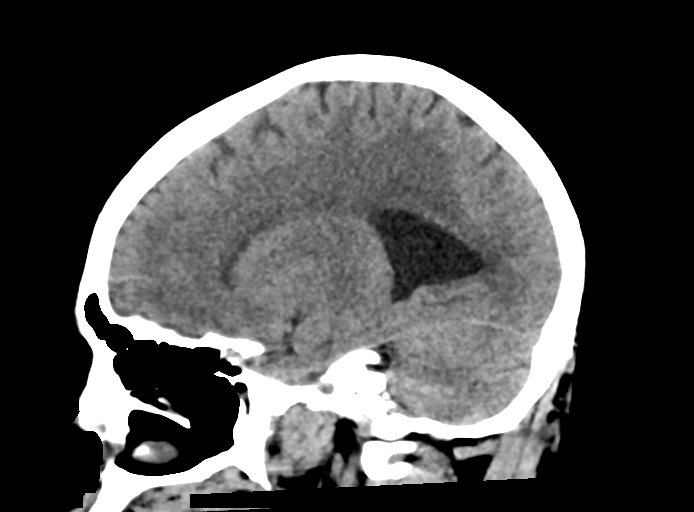
[im 34/67  brain]
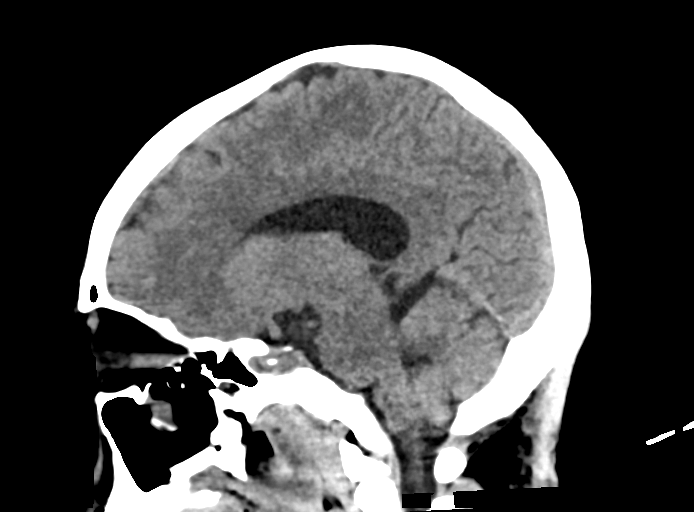
[im 44/67  brain]
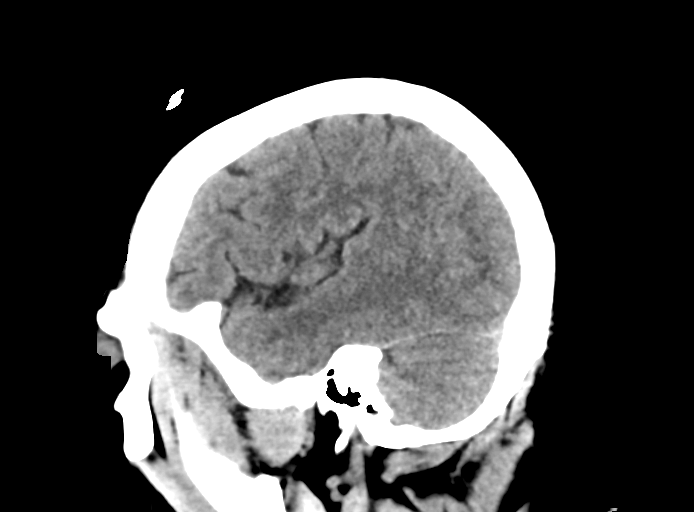

[16 of 47 positions shown; findings below may reference images not displayed]

FINDINGS: Brain: Mild dilatation of the atria of the lateral ventricles
bilaterally, chronic and unchanged. Negative for acute infarct,
hemorrhage, or mass.

Vascular: Negative for hyperdense vessel

Skull: Negative

Sinuses/Orbits: Chronic fracture right medial orbit unchanged.
Paranasal sinuses clear.

Other: None
IMPRESSION: No acute abnormality and no change from prior studies.

## 2018-05-12 MED ORDER — INSULIN ASPART 100 UNIT/ML ~~LOC~~ SOLN
0.0000 [IU] | Freq: Every day | SUBCUTANEOUS | Status: DC
  Administered 2018-05-12: 2 [IU] via SUBCUTANEOUS

## 2018-05-12 MED ORDER — ENOXAPARIN SODIUM 40 MG/0.4ML ~~LOC~~ SOLN
40.0000 mg | SUBCUTANEOUS | Status: DC
  Filled 2018-05-12: qty 0.4

## 2018-05-12 MED ORDER — ACETAMINOPHEN 650 MG RE SUPP: 650.0000 mg | Freq: Four times a day (QID) | RECTAL | Status: DC | PRN

## 2018-05-12 MED ORDER — SODIUM CHLORIDE 0.9 % IV BOLUS
700.0000 mL | Freq: Once | INTRAVENOUS | Status: AC
  Administered 2018-05-12: 700 mL via INTRAVENOUS

## 2018-05-12 MED ORDER — INSULIN ASPART 100 UNIT/ML ~~LOC~~ SOLN: 0.0000 [IU] | Freq: Three times a day (TID) | SUBCUTANEOUS | Status: DC

## 2018-05-12 MED ORDER — ACETAMINOPHEN 325 MG PO TABS: 650.0000 mg | ORAL_TABLET | Freq: Four times a day (QID) | ORAL | Status: DC | PRN

## 2018-05-12 MED ORDER — SODIUM CHLORIDE 0.9 % IV BOLUS
500.0000 mL | Freq: Once | INTRAVENOUS | Status: AC
  Administered 2018-05-12: 500 mL via INTRAVENOUS

## 2018-05-12 MED ORDER — SODIUM CHLORIDE 0.9% FLUSH: 3.0000 mL | Freq: Two times a day (BID) | INTRAVENOUS | Status: DC

## 2018-05-12 MED ORDER — INSULIN GLARGINE 100 UNIT/ML ~~LOC~~ SOLN
45.0000 [IU] | Freq: Every day | SUBCUTANEOUS | Status: DC
  Administered 2018-05-13: 45 [IU] via SUBCUTANEOUS
  Filled 2018-05-12: qty 0.45

## 2018-05-12 NOTE — ED Notes (Signed)
Patient transported to CT 

## 2018-05-12 NOTE — Progress Notes (Signed)
Paged to see patient 8:59 PM. Patient had become alert and evidently was unaware why she need to be in the hospital. She had gotten dressed, removed her IV and telemetry, and had reportedly left the floor. She was brought back by security and wanted to speak with a physician about why she was here and why she could not eat.  Patient seen at bedside. She was alert and oriented and ordering a meal. She stated she was feeling better. It was explained to her that we are monitoring her heart rhythm and correcting her magnesium levels to ensure she does not have any dangerous arrhythmias. Patient stated she understood and is agreeable to staying as well as having IV and cardiac monitoring replaced.  It was also explained her diet was held due to her initial confusion and she would be receiving her home medications in the morning unless there was a reason not to.  Vitals:   05/12/18 1945 05/12/18 2026  BP: 133/83 (!) 151/94  Pulse: 62 70  Resp:    Temp:  98 F (36.7 C)  SpO2: 99% 96%   Patient agreeable to continued monitoring following presumed accidental Seroquel overdose and hypomagnesemia. - Resume Cardiac Monitoring, EKGs, and scheduled Labs - Replace IV - Tele Sitter okay (nursing states they do not have an in person sitter at this time) - CM diet - Lantus 45U qAM

## 2018-05-12 NOTE — ED Triage Notes (Signed)
Pt brought in by [facility] from [facility]. Pt went outside to smoke and when coming back inside she had a witnessed fall by the security guard. Pt was unresponsive. EMS reports pt has purposeful movements. Responds to touch. Pt has hx of anxiety and bipolar. BP 106/70 HR 60 CBG 182 97% RA

## 2018-05-12 NOTE — H&P (Signed)
Date: 05/12/2018               Patient Name:  Kaitlyn Gray MRN: [id number]  DOB: October 09, 1975 Age / Sex: 42 y.o., female   PCP: Hilbert Corrigan Chales Abrahams, NP         Medical Service: Internal Medicine Teaching Service         Attending Physician: Dr. Earl Lagos, MD    First Contact: Dr. Petra Kuba Pager: [phone]  Second Contact: Dr. Evelene Croon Pager: [phone]       After Hours (After 5p/  First Contact Pager: [phone]  weekends / holidays): Second Contact Pager: [phone]   Chief Complaint: somnolence   History of Present Illness: 42yo female with bipolar disorder, insulin dependent type 2 diabetes, HTN, polysubstance abuse, chronic neck pain presented via EMS after being found with decreased responsiveness. Per chart review, [employer] staff member heard the patient say she was going to take her medications and then the next time he saw her, she was sleeping leaned over a desk and only arousable with stimulation. The staff member denied fall or head trauma. Per report, she was unresponsive but responds to touch, BP 106/70, HR 60, CBG 182, O2 97% RA. Found with empty naproxen and seroquel bottles in her pocket. Per chart review, the patient was being discharged from the facility early due to aggressive behavior and medication noncompliance.  During my encounter, the patient was somnolent but arousable to verbal and physical stimulation. Answered most questions but dosed off several times during my encounter. She endorsed taking 2-3 pills of seroquel and naproxen so she could sleep. She denies SI or HI and reports she has 2 kids she needs to take care of.   ROS + dysuria, cough, back pain     Meds:  Current Meds  Medication Sig  . ABILIFY MAINTENA 400 MG PRSY prefilled syringe Inject 400 mg into the muscle every 30 (thirty) days.  [name] escitalopram (LEXAPRO) 10 MG tablet Take 10 mg by mouth daily.  . Insulin Glargine (LANTUS SOLOSTAR) 100 UNIT/ML Solostar Pen Inject 45 Units into the skin  every morning.  . metFORMIN (GLUCOPHAGE) 1000 MG tablet Take 1 tablet (1,000 mg total) by mouth 2 (two) times daily with a meal. (Patient taking differently: Take 500 mg by mouth 2 (two) times daily with a meal. )  . methocarbamol (ROBAXIN) 500 MG tablet Take 500 mg by mouth 2 (two) times daily.  [name] NOVOLOG FLEXPEN 100 UNIT/ML FlexPen Inject 8 Units into the skin 3 (three) times daily.  . QUEtiapine (SEROQUEL) 50 MG tablet Take 50 mg by mouth at bedtime.     Allergies: Allergies as of 05/12/2018 - Review Complete 05/12/2018  Allergen Reaction Noted  . Hydrocodone Itching 01/22/2015  . Latex Rash 12/25/2012   Past Medical History:  Diagnosis Date  . Bipolar 1 disorder ([facility])   . 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 Hypertension     Family History: Mother HTN, CAD. Father HTN  Social History: Per chart review, current smoker, no alcohol, +marijuana, cocaine use.   Review of Systems: Unable to complete a full ROS due to decreased mental status   Physical Exam: Blood pressure (!) 147/90, pulse 63, temperature 97.6 F (36.4 C), temperature source Oral, resp. rate 13, height 5\' 3"  (1.6 m), weight 93 kg, last menstrual period 04/29/2018, SpO2 100 %. Vitals:   05/12/18 1251 05/12/18 1305 05/12/18 1715  BP: 117/71  (!) 147/90  Pulse: 68  63  Resp: 20  13  Temp: 97.6 F (36.4 C)    TempSrc: Oral    SpO2: 100%  100%  Weight:  93 kg   Height:  5\' 3"  (1.6 m)    General: Vital signs reviewed.  Patient is somnolent but arousable. Well-developed and well-nourished, in no acute distress and cooperative with exam.  Head: Normocephalic and atraumatic. Eyes: EOMI, +purulent discharge from left eye, conjunctivae normal, no scleral icterus. PERRL Neck: Supple, trachea midline, normal ROM, no JVD, masses, thyromegaly Cardiovascular: RRR, S1 normal, S2 normal, no murmurs, gallops, or rubs. Pulmonary/Chest: Clear to auscultation bilaterally,  no wheezes, rales, or rhonchi. Abdominal: Soft, non-tender, non-distended, BS +, no masses, organomegaly, or guarding present.  Musculoskeletal: No joint deformities, erythema, or stiffness, ROM full and nontender. Extremities: No lower extremity edema bilaterally,  pulses symmetric and intact bilaterally. No cyanosis or clubbing. Neurological: A&O x3, Strength is normal and symmetric bilaterally, unable to assess cranial nervest, no focal motor deficit but exam limited due to patient's decreased mental status, sensory intact to light touch bilaterally.  Skin: Warm, dry and intact. No rashes or erythema. Psychiatric: Somnolent mood and affect. Cognition and memory are normal.    Labs: WBC 5.5 Hemoglobin 10.8 MCV 84.2  CMP unremarkable  Mag 2.1  Acetaminophen <10 Salicylate < 7 Alcohol < 10 b-hcg < 5   EKG: personally reviewed my interpretation is sinus rhythm, HR 67, QTc 446, no ischemic changes  CT head: no acute abnormality   Assessment & Plan by Problem: Active Problems:   Acute encephalopathy  42yo female with bipolar disorder, insulin dependent type 2 diabetes, HTN, polysubstance abuse, and chronic neck pain who presented with acute encephalopathy after ingesting an unknown amount of seroquel and naproxen.   Acute encephalopathy: Most likely 2/2 increased use of Seroquel. Will monitor QTc closely. Does report cough and dysuria. Afebrile. Normal vital signs. Lungs are CTAB. Mild purulent discharge from left eye. No leukocytosis. No significant electrolyte abnormalities. Initial drug toxicities negative including acetaminophen, salicylate, alcohol. Initial EKG stable with QTc 446.   - UDS  - UA to evaluate dysuria  - trend EKGs q4-6h. If 2-3 normal, can go q6-8h.  - cardiac monitoring  - NPO until mental status improves  T2DM: unable to verify home regiment with patient. Will start novolog sliding scale and monitor CBGs. Can add Lantus if needed. Will check a1c.     Dispo: Admit patient to Inpatient with expected length of stay greater than 2 midnights.  Signed: Ali Lowe, MD 05/12/2018, 6:44 PM  Pager: [phone]

## 2018-05-12 NOTE — ED Notes (Signed)
Main lab called about labs that are pending. Main lab is running them now

## 2018-05-12 NOTE — ED Notes (Signed)
Staffing office called and notified of the need for a sitter

## 2018-05-12 NOTE — ED Notes (Signed)
ED TO INPATIENT HANDOFF REPORT  Name/Age/Gender Kaitlyn Gray 42 y.o. female  Code Status    Code Status Orders  (From admission, onward)         Start     Ordered   05/12/18 1808  Full code  Continuous     05/12/18 1813        Code Status History    Date Active Date Inactive Code Status Order ID Comments User Context   12/06/2017 1650 12/07/2017 1838 Full Code [id number]  Pearson Grippe, MD ED   07/19/2017 0443 07/22/2017 1540 Full Code [id number]  Clydie Braun, MD ED   03/23/2015 1743 03/24/2015 1726 Full Code [id number]  Beau Fanny, FNP Inpatient   03/22/2015 1527 03/23/2015 1700 Full Code [id number]  Linwood Dibbles, MD ED   10/10/2014 2126 10/11/2014 0335 Full Code [id number]  Derwood Kaplan, MD ED   03/21/2013 2317 03/23/2013 1251 Full Code [id number]  Gerhard Munch, MD ED   03/01/2013 2104 03/02/2013 1316 Full Code [id number]  Schinlever, Haydee Salter ED      Home/SNF/Other Home  Chief Complaint Unresponsive  Level of Care/Admitting Diagnosis ED Disposition    ED Disposition Condition Comment   Admit  Hospital Area: [facility] [[id number]]  Level of Care: Telemetry [5]  Diagnosis: Acute encephalopathy [035009]  Admitting Physician: Earl Lagos [[id number]]  Attending Physician: Earl Lagos [phone]  Estimated length of stay: past midnight tomorrow  Certification:: I certify this patient will need inpatient services for at least 2 midnights  PT Class (Do Not Modify): Inpatient [101]  PT Acc Code (Do Not Modify): Private [1]       Medical History Past Medical History:  Diagnosis Date  . Bipolar 1 disorder (HCC)   . 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 Hypertension     Allergies Allergies  Allergen Reactions  . Hydrocodone Itching  . Latex Rash    IV Location/Drains/Wounds Patient Lines/Drains/Airways Status   Active Line/Drains/Airways    Name:   Placement date:   Placement  time:   Site:   Days:   Peripheral IV 05/12/18 Right Antecubital   05/12/18    1419    Antecubital   less than 1          Labs/Imaging Results for orders placed or performed during the hospital encounter of 05/12/18 (from the past 48 hour(s))  CBC with Differential     Status: Abnormal   Collection Time: 05/12/18  1:13 PM  Result Value Ref Range   WBC 5.5 4.0 - 10.5 K/uL   RBC 4.43 3.87 - 5.11 MIL/uL   Hemoglobin 10.8 (L) 12.0 - 15.0 g/dL   HCT 89.3 81.0 - 17.5 %   MCV 84.2 80.0 - 100.0 fL   MCH 24.4 (L) 26.0 - 34.0 pg   MCHC 29.0 (L) 30.0 - 36.0 g/dL   RDW 10.2 (H) 58.5 - 27.7 %   Platelets 307 150 - 400 K/uL   nRBC 0.0 0.0 - 0.2 %   Neutrophils Relative % 60 %   Neutro Abs 3.3 1.7 - 7.7 K/uL   Lymphocytes Relative 28 %   Lymphs Abs 1.5 0.7 - 4.0 K/uL   Monocytes Relative 9 %   Monocytes Absolute 0.5 0.1 - 1.0 K/uL   Eosinophils Relative 2 %   Eosinophils Absolute 0.1 0.0 - 0.5 K/uL   Basophils Relative 1 %   Basophils Absolute 0.0 0.0 - 0.1 K/uL   Immature  Granulocytes 0 %   Abs Immature Granulocytes 0.01 0.00 - 0.07 K/uL    Comment: Performed at [facility], [address], [place], [place] [address]  Comprehensive metabolic panel     Status: Abnormal   Collection Time: 05/12/18  1:13 PM  Result Value Ref Range   Sodium 137 135 - 145 mmol/L   Potassium 4.0 3.5 - 5.1 mmol/L   Chloride 103 98 - 111 mmol/L   CO2 25 22 - 32 mmol/L   Glucose, Bld 150 (H) 70 - 99 mg/dL   BUN 13 6 - 20 mg/dL   Creatinine, Ser 7.82 0.44 - 1.00 mg/dL   Calcium 9.4 8.9 - 95.6 mg/dL   Total Protein 7.0 6.5 - 8.1 g/dL   Albumin 3.6 3.5 - 5.0 g/dL   AST 14 (L) 15 - 41 U/L   ALT 15 0 - 44 U/L   Alkaline Phosphatase 65 38 - 126 U/L   Total Bilirubin 0.2 (L) 0.3 - 1.2 mg/dL   GFR calc non Af Amer >60 >60 mL/min   GFR calc Af Amer >60 >60 mL/min   Anion gap 9 5 - 15    Comment: Performed at [facility], [address], [place], [place] [address]  Ethanol     Status: None    Collection Time: 05/12/18  1:13 PM  Result Value Ref Range   Alcohol, Ethyl (B) <10 <10 mg/dL    Comment: (NOTE) Lowest detectable limit for serum alcohol is 10 mg/dL. For medical purposes only. Performed at [facility], [address] [place], [place] [address]   Magnesium     Status: None   Collection Time: 05/12/18  1:13 PM  Result Value Ref Range   Magnesium 2.1 1.7 - 2.4 mg/dL    Comment: Performed at [facility], [address] [place], [place] [address]  Acetaminophen level     Status: Abnormal   Collection Time: 05/12/18  1:38 PM  Result Value Ref Range   Acetaminophen (Tylenol), Serum <10 (L) 10 - 30 ug/mL    Comment: (NOTE) Therapeutic concentrations vary significantly. A range of 10-30 ug/mL  may be an effective concentration for many patients. However, some  are best treated at concentrations outside of this range. Acetaminophen concentrations >150 ug/mL at 4 hours after ingestion  and >50 ug/mL at 12 hours after ingestion are often associated with  toxic reactions. Performed at [facility], [address] [place], [place] [address]   Salicylate level     Status: None   Collection Time: 05/12/18  1:38 PM  Result Value Ref Range   Salicylate Lvl <7.0 2.8 - 30.0 mg/dL    Comment: Performed at [facility], [address], [place], [place] [address]  I-Stat Beta hCG blood, ED ([facility], [facility], [facility] only)     Status: None   Collection Time: 05/12/18  2:21 PM  Result Value Ref Range   I-stat hCG, quantitative <5.0 <5 mIU/mL   Comment 3            Comment:   GEST. AGE      CONC.  (mIU/mL)   <=1 WEEK        5 - 50     2 WEEKS       50 - 500     3 WEEKS       100 - 10,000     4 WEEKS     1,000 -  30,000        FEMALE AND NON-PREGNANT FEMALE:     LESS THAN 5 mIU/mL    Ct Head Wo Contrast  Result Date: 05/12/2018 CLINICAL DATA:  Altered level of consciousness EXAM: CT HEAD WITHOUT CONTRAST TECHNIQUE: Contiguous axial images were obtained from the  base of the skull through the vertex without intravenous contrast. COMPARISON:  CT head 12/29/2017 FINDINGS: Brain: Mild dilatation of the atria of the lateral ventricles bilaterally, chronic and unchanged. Negative for acute infarct, hemorrhage, or mass. Vascular: Negative for hyperdense vessel Skull: Negative Sinuses/Orbits: Chronic fracture right medial orbit unchanged. Paranasal sinuses clear. Other: None IMPRESSION: No acute abnormality and no change from prior studies. Electronically Signed   By: Marlan Palau M.D.   On: 05/12/2018 15:13    Pending Labs Unresulted Labs (From admission, onward)    Start     Ordered   05/13/18 0500  CBC  Tomorrow morning,   R     05/12/18 1813   05/13/18 0500  Magnesium  Tomorrow morning,   R     05/12/18 1813   05/13/18 0500  Comprehensive metabolic panel  Tomorrow morning,   R     05/12/18 1905   05/12/18 2100  Magnesium  Once,   R     05/12/18 1819   05/12/18 2100  Comprehensive metabolic panel  Once,   R     16/10/96 1905   05/12/18 1812  Hemoglobin A1c  Add-on,   R     05/12/18 1813   05/12/18 1811  Urinalysis, Routine w reflex microscopic  Once,   R     05/12/18 1813   05/12/18 1313  Rapid urine drug screen (hospital performed)  ONCE - STAT,   R     05/12/18 1313          Vitals/Pain Today's Vitals   05/12/18 1251 05/12/18 1305 05/12/18 1715  BP: 117/71  (!) 147/90  Pulse: 68  63  Resp: 20  13  Temp: 97.6 F (36.4 C)    TempSrc: Oral    SpO2: 100%  100%  Weight:  93 kg   Height:  5\' 3"  (1.6 m)   PainSc:  10-Worst pain ever     Isolation Precautions No active isolations  Medications Medications  enoxaparin (LOVENOX) injection 40 mg (has no administration in time range)  sodium chloride flush (NS) 0.9 % injection 3 mL (has no administration in time range)  acetaminophen (TYLENOL) tablet 650 mg (has no administration in time range)    Or  acetaminophen (TYLENOL) suppository 650 mg (has no administration in time range)   insulin aspart (novoLOG) injection 0-20 Units (has no administration in time range)  insulin aspart (novoLOG) injection 0-5 Units (has no administration in time range)  sodium chloride 0.9 % bolus 500 mL (0 mLs Intravenous Stopped 05/12/18 1554)  sodium chloride 0.9 % bolus 700 mL (0 mLs Intravenous Stopped 05/12/18 1635)    Mobility walks

## 2018-05-12 NOTE — ED Provider Notes (Signed)
[facility] EMERGENCY DEPARTMENT Provider Note   CSN: [id number] Arrival date & time: 05/12/18  1242     History   Chief Complaint No chief complaint on file.   HPI Kaitlyn Gray is a 42 y.o. female presenting for evaluation of syncopal event.  Level 5 caveat, patient refusing to answer questions.  Per EMS, patient coming from [facility].  Patient went outside to smoke and when coming back inside had a witnessed syncopal event. Since then, pt has refused to respond to questions, but will follow basic commands.   Additional history obtained from chart review. Pt with past medical history significant for bipolar disorder, diabetes, and polysubstance abuse including cocaine and marijuana. Pt was seen 6 days ago for lightheadedness during which she left AMA.   Per [employer] member who witnessed events, pt stated she was going to take her medication. When he next saw her she was sleeping leaned over a desk and could only be woken up with stimulation. Per staff member, there was no fall, pt did not hit her head. Pt was found with wmpty naproxen 500 mg and Seroquel 50 mg bottles in her pocket. Per staff member, pt was being d/c from the facility early due to aggressive behavior and medication noncompliance.    HPI  Past Medical History:  Diagnosis Date  . Bipolar 1 disorder ([place])   . 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 ([place]) 07/19/2017  . Hypertension     Patient Active Problem List   Diagnosis Date Noted  . Diabetes mellitus without complication ([place]) 88/89/1694  . Hypokalemia 12/06/2017  . Hypertension 12/06/2017  . 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 ([place]) 07/19/2017  . Bipolar disorder ([place]) 07/19/2017  . Polysubstance abuse ([place]) 07/19/2017  . Chest pain 07/19/2017  . Cocaine use disorder, severe, dependence ([place]) 03/24/2015  . Cannabis use disorder, moderate,  dependence ([place]) 03/24/2015  . MDD (major depressive disorder), recurrent severe, without psychosis ([place]) 03/24/2015    Past Surgical History:  Procedure Laterality Date  . CESAREAN SECTION       OB History   None      Home Medications    Prior to Admission medications   Medication Sig Start Date End Date Taking? Authorizing Provider  blood glucose meter kit and supplies KIT Dispense based on patient and insurance preference. Use up to four times daily as directed. (FOR ICD-9 250.00, 250.01). Please give 3 months supply of lancets and test strips to allow her to check QID. 12/07/17   Roxan Hockey, MD  blood glucose meter kit and supplies Relion Prime or Dispense other brand based on patient and insurance preference. Use up to four times daily as directed. (FOR ICD-9 250.00, 250.01). 12/07/17   Roxan Hockey, MD  Insulin Glargine (LANTUS SOLOSTAR) 100 UNIT/ML Solostar Pen Inject 45 Units into the skin every morning. 12/07/17   Emokpae, Courage, MD  insulin lispro (HUMALOG KWIKPEN) 100 UNIT/ML KiwkPen Inject 0-0.2 mLs (0-20 Units total) into the skin 4 (four) times daily -  before meals and at bedtime. Inject 0-20 Units into the skin 3 (three) times daily with meals. CBG 70 - 120: 0 units  CBG 121 - 150: 3 units  CBG 151 - 200: 4 units  CBG 201 - 250: 7 units  CBG 251 - 300: 11 units  CBG 301 - 350: 15 units  CBG 351 - 400: 20 units 12/07/17   Roxan Hockey, MD  Insulin Pen Needle 31G X 5 MM MISC Use as directed 12/07/17   Roxan Hockey, MD  metFORMIN (GLUCOPHAGE) 1000 MG tablet Take 1 tablet (1,000 mg total) by mouth 2 (two) times daily with a meal. 12/07/17 12/07/18  Roxan Hockey, MD    Family History Family History  Problem Relation Age of Onset  . Hypertension Mother   . CAD Mother 74       died of MI at age 44  . Hypertension Father     Social History Social History   Tobacco Use  . Smoking status: Current Every Day Smoker    Packs/day: 0.50    Types:  Cigarettes  . Smokeless tobacco: Never Used  Substance Use Topics  . Alcohol use: No  . Drug use: Yes    Types: Marijuana, Cocaine     Allergies   Hydrocodone and Latex   Review of Systems Review of Systems  Unable to perform ROS: Mental status change     Physical Exam Updated Vital Signs BP (!) 147/90   Pulse 63   Temp 97.6 F (36.4 C) (Oral)   Resp 13   Ht '5\' 3"'  (1.6 m)   Wt 93 kg   LMP 04/29/2018 (Within Days)   SpO2 100%   BMI 36.31 kg/m   Physical Exam  Constitutional: She appears lethargic.  Pt responding to voice and touch, but keeping eyes closed  HENT:  Head: Normocephalic and atraumatic.  Eyes: Pupils are equal, round, and reactive to light. Conjunctivae are normal.  Neck: Normal range of motion. Neck supple.  Cardiovascular: Normal rate, regular rhythm and intact distal pulses.  Pulmonary/Chest: Effort normal and breath sounds normal. No respiratory distress. She has no wheezes.  Abdominal: Soft. She exhibits no distension and no mass. There is no tenderness. There is no guarding.  Musculoskeletal: Normal range of motion.  Moves all extremities with purpose. Radial and pedal pulses intact bilaterally  Neurological: She appears lethargic. GCS eye subscore is 3. GCS verbal subscore is 2. GCS motor subscore is 6.  Responsive to voice and touch. Will yell out and immediately fall back asleep.   Skin: Skin is warm and dry. Capillary refill takes less than 2 seconds.  Nursing note and vitals reviewed.    ED Treatments / Results  Labs (all labs ordered are listed, but only abnormal results are displayed) Labs Reviewed  CBC WITH DIFFERENTIAL/PLATELET - Abnormal; Notable for the following components:      Result Value   Hemoglobin 10.8 (*)    MCH 24.4 (*)    MCHC 29.0 (*)    RDW 16.3 (*)    All other components within normal limits  COMPREHENSIVE METABOLIC PANEL - Abnormal; Notable for the following components:   Glucose, Bld 150 (*)    AST 14 (*)      Total Bilirubin 0.2 (*)    All other components within normal limits  ACETAMINOPHEN LEVEL - Abnormal; Notable for the following components:   Acetaminophen (Tylenol), Serum <10 (*)    All other components within normal limits  ETHANOL  SALICYLATE LEVEL  MAGNESIUM  RAPID URINE DRUG SCREEN, HOSP PERFORMED  I-STAT BETA HCG BLOOD, ED ([facility], [facility], [facility] ONLY)  CBG MONITORING, ED    EKG EKG Interpretation  Date/Time:  Wednesday May 12 2018 13:05:44 EST Ventricular Rate:  67 PR Interval:    QRS Duration: 102 QT Interval:  458 QTC Calculation: 484 R Axis:   30 Text Interpretation:  Sinus rhythm Probable left atrial enlargement  ST elev, probable normal early repol pattern Confirmed by Julianne Rice [phone]) on 05/12/2018 5:03:15 PM   Radiology Ct Head Wo Contrast  Result Date: 05/12/2018 CLINICAL DATA:  Altered level of consciousness EXAM: CT HEAD WITHOUT CONTRAST TECHNIQUE: Contiguous axial images were obtained from the base of the skull through the vertex without intravenous contrast. COMPARISON:  CT head 12/29/2017 FINDINGS: Brain: Mild dilatation of the atria of the lateral ventricles bilaterally, chronic and unchanged. Negative for acute infarct, hemorrhage, or mass. Vascular: Negative for hyperdense vessel Skull: Negative Sinuses/Orbits: Chronic fracture right medial orbit unchanged. Paranasal sinuses clear. Other: None IMPRESSION: No acute abnormality and no change from prior studies. Electronically Signed   By: Franchot Gallo M.D.   On: 05/12/2018 15:13    Procedures Procedures (including critical care time)  Medications Ordered in ED Medications  sodium chloride 0.9 % bolus 500 mL (0 mLs Intravenous Stopped 05/12/18 1554)  sodium chloride 0.9 % bolus 700 mL (0 mLs Intravenous Stopped 05/12/18 1635)     Initial Impression / Assessment and Plan / ED Course  I have reviewed the triage vital signs and the nursing notes.  Pertinent labs & imaging results that were  available during my care of the patient were reviewed by me and considered in my medical decision making (see chart for details).     Pt presenting for evaluation of altered mental status.  On exam, patient is sleeping.  Will arouse to voice and physical stimuli, and immediately fell back asleep.  Per [employer] member, concern for possible overdose of naproxen and Seroquel.  Also, considering patient's history of drug use.  Will obtain labs, UDS, EKG, CT head for further evaluation.  Will call poison control for further management.  Discussed with poison control, who recommends mostly monitoring.  Recommends EKG every 4 to 6 hours.  At the first 2-3 are normal, can go every 6-8 hours for the first 24 hours.  If QTC is 500 or greater, optimize potassium and magnesium to high levels of normal and repeat EKG after several hours to see if QTC narrows.  Signs of mild overdose would include dry mouth, dizziness, sinus tach, and altered mental status.  Signs of serious overdose include CNS depression, seizure/jerking, qt prolongation, high sinus tach.   Initial ekg reassuring, qtc 484, no stemi. Labs reassuring. hgb low, but at baseline. Acetaminophen, ethanol, and salycylate levels negative. uds pending collection. CT head negative.  Repeat EKG without signs of increasing qtc, is improved to 644. Pt remains asleep, will wake to stimuli. HR and BP stable. Will call for admission.   Discussed with Dr. Frederico Hamman, pt to be admitted.  Consider need for [facility] eval when pt is medically cleared and more responsive.  Final Clinical Impressions(s) / ED Diagnoses   Final diagnoses:  Altered mental status, unspecified altered mental status type    ED Discharge Orders    None       Franchot Heidelberg, PA-C 05/12/18 1733    Daleen Bo, MD 05/12/18 2011

## 2018-05-13 DIAGNOSIS — T43591A Poisoning by other antipsychotics and neuroleptics, accidental (unintentional), initial encounter: Secondary | ICD-10-CM | POA: Diagnosis not present

## 2018-05-13 DIAGNOSIS — M79662 Pain in left lower leg: Secondary | ICD-10-CM

## 2018-05-13 DIAGNOSIS — G92 Toxic encephalopathy: Secondary | ICD-10-CM | POA: Diagnosis not present

## 2018-05-13 DIAGNOSIS — I10 Essential (primary) hypertension: Secondary | ICD-10-CM | POA: Diagnosis not present

## 2018-05-13 DIAGNOSIS — G9340 Encephalopathy, unspecified: Secondary | ICD-10-CM | POA: Diagnosis not present

## 2018-05-13 DIAGNOSIS — F17210 Nicotine dependence, cigarettes, uncomplicated: Secondary | ICD-10-CM | POA: Diagnosis not present

## 2018-05-13 DIAGNOSIS — E119 Type 2 diabetes mellitus without complications: Secondary | ICD-10-CM | POA: Diagnosis not present

## 2018-05-13 LAB — GLUCOSE, CAPILLARY
GLUCOSE-CAPILLARY: 155 mg/dL — AB (ref 70–99)
GLUCOSE-CAPILLARY: 174 mg/dL — AB (ref 70–99)

## 2018-05-13 LAB — COMPREHENSIVE METABOLIC PANEL
ALBUMIN: 3 g/dL — AB (ref 3.5–5.0)
ALT: 10 U/L (ref 0–44)
ANION GAP: 6 (ref 5–15)
AST: 12 U/L — AB (ref 15–41)
Alkaline Phosphatase: 57 U/L (ref 38–126)
BUN: 10 mg/dL (ref 6–20)
CHLORIDE: 106 mmol/L (ref 98–111)
CO2: 27 mmol/L (ref 22–32)
CREATININE: 1 mg/dL (ref 0.44–1.00)
Calcium: 9 mg/dL (ref 8.9–10.3)
GFR calc Af Amer: >60 mL/min (ref >60)
GFR calc non Af Amer: >60 mL/min (ref >60)
GLUCOSE: 109 mg/dL — AB (ref 70–99)
Potassium: 3.8 mmol/L (ref 3.5–5.1)
Sodium: 139 mmol/L (ref 135–145)
Total Bilirubin: 0.4 mg/dL (ref 0.3–1.2)
Total Protein: 6.3 g/dL — ABNORMAL LOW (ref 6.5–8.1)

## 2018-05-13 LAB — CBC
HCT: 34 % — ABNORMAL LOW (ref 36.0–46.0)
Hemoglobin: 9.8 g/dL — ABNORMAL LOW (ref 12.0–15.0)
MCH: 23.8 pg — ABNORMAL LOW (ref 26.0–34.0)
MCHC: 28.8 g/dL — ABNORMAL LOW (ref 30.0–36.0)
MCV: 82.7 fL (ref 80.0–100.0)
NRBC: 0 % (ref 0.0–0.2)
Platelets: 301 10*3/uL (ref 150–400)
RBC: 4.11 MIL/uL (ref 3.87–5.11)
RDW: 16.4 % — ABNORMAL HIGH (ref 11.5–15.5)
WBC: 5.8 10*3/uL (ref 4.0–10.5)

## 2018-05-13 LAB — MAGNESIUM: MAGNESIUM: 2 mg/dL (ref 1.7–2.4)

## 2018-05-13 LAB — MRSA PCR SCREENING: MRSA by PCR: NEGATIVE

## 2018-05-13 NOTE — Discharge Summary (Signed)
Name: Kaitlyn Gray MRN: [id number] DOB: August 27, 1975 42 y.o. PCP: Kaitlyn Coots, NP  Date of Admission: 05/12/2018 12:42 PM Date of Discharge: 05/13/18 Attending Physician: Kaitlyn Contes, MD  Discharge Diagnosis: 1. Acute Encephalopathy   Discharge Medications: Allergies as of 05/13/2018      Reactions   Hydrocodone Itching   Latex Rash      Medication List    TAKE these medications   ABILIFY MAINTENA 400 MG Prsy prefilled syringe Generic drug:  ARIPiprazole ER Inject 400 mg into the muscle every 30 (thirty) days.   blood glucose meter kit and supplies Relion Prime or Dispense other brand based on patient and insurance preference. Use up to four times daily as directed. (FOR ICD-9 250.00, 250.01).   blood glucose meter kit and supplies Kit Dispense based on patient and insurance preference. Use up to four times daily as directed. (FOR ICD-9 250.00, 250.01). Please give 3 months supply of lancets and test strips to allow her to check QID.   escitalopram 10 MG tablet Commonly known as:  LEXAPRO Take 10 mg by mouth daily.   Insulin Glargine 100 UNIT/ML Solostar Pen Commonly known as:  LANTUS Inject 45 Units into the skin every morning.   insulin lispro 100 UNIT/ML KiwkPen Commonly known as:  HUMALOG Inject 0-0.2 mLs (0-20 Units total) into the skin 4 (four) times daily -  before meals and at bedtime. Inject 0-20 Units into the skin 3 (three) times daily with meals. CBG 70 - 120: 0 units  CBG 121 - 150: 3 units  CBG 151 - 200: 4 units  CBG 201 - 250: 7 units  CBG 251 - 300: 11 units  CBG 301 - 350: 15 units  CBG 351 - 400: 20 units   Insulin Pen Needle 31G X 5 MM Misc Use as directed   metFORMIN 1000 MG tablet Commonly known as:  GLUCOPHAGE Take 1 tablet (1,000 mg total) by mouth 2 (two) times daily with a meal. What changed:  how much to take   methocarbamol 500 MG tablet Commonly known as:  ROBAXIN Take 500 mg by mouth 2 (two) times daily.     NOVOLOG FLEXPEN 100 UNIT/ML FlexPen Generic drug:  insulin aspart Inject 8 Units into the skin 3 (three) times daily.   QUEtiapine 50 MG tablet Commonly known as:  SEROQUEL Take 50 mg by mouth at bedtime.       Disposition and follow-up:   KaitlynKaitlyn Gray was discharged from [facility] in Good condition.  At the hospital follow up visit please address:  1.  Review compliance with seroquel. Address any sleep concerns.   2.  Labs / imaging needed at time of follow-up: none  3.  Pending labs/ test needing follow-up: none  Follow-up Appointments:   Hospital Gray by problem list: 77.  42 year old woman with type 2 insulin-dependent diabetes, hypertension, polysubstance abuse, chronic neck pain, bipolar disorder on Seroquel, Lexapro, and Abilify who presented from a shelter after being found asleep with decreased responsiveness.  Her vital signs were stable and she did not require supplemental oxygen or airway assistance.  She was somnolent on presentation but arousable to verbal and physical stimulation.  She admitted to taking extra Seroquel because she wanted to sleep.  Denies SI and HI, and states that she has 2 kids to live for.  Upon arrival to the ED, poison control was called.  She was observed overnight.  Serial EKGs were obtained without evidence of arrhythmia or  QTC prolongation.  No electrolyte abnormalities on blood work.  Her mental status returned to baseline.  Social work discussed available homeless shelters.  Discharge Vitals:   BP (!) 157/78 (BP Location: Left Arm)   Pulse 79   Temp 98.3 F (36.8 C) (Oral)   Resp 18   Ht '5\' 3"'  (1.6 m)   Wt 92.1 kg   LMP 04/29/2018 (Within Days)   SpO2 95%   BMI 35.96 kg/m   Pertinent Labs, Studies, and Procedures:  QTc 484 on admission, 449 on discharge  BMP Latest Ref Rng & Units 05/13/2018 05/12/2018 05/12/2018  Glucose 70 - 99 mg/dL 109(H) 303(H) 150(H)  BUN 6 - 20 mg/dL '10 10 13  ' Creatinine 0.44 -  1.00 mg/dL 1.00 1.00 0.98  Sodium 135 - 145 mmol/L 139 138 137  Potassium 3.5 - 5.1 mmol/L 3.8 3.5 4.0  Chloride 98 - 111 mmol/L 106 102 103  CO2 22 - 32 mmol/L '27 27 25  ' Calcium 8.9 - 10.3 mg/dL 9.0 9.1 9.4     Discharge Instructions: Discharge Instructions    Diet - low sodium heart healthy   Complete by:  As directed    Discharge instructions   Complete by:  As directed    Kaitlyn Gray, Gray were admitted to the hospital after you took too much Seroquel.  Taking too much of this medicine can cause life-threatening heart issues, such as arrhythmias.  Be monitored your heart rhythm and electrolyte levels closely overnight and things remained normal.  Please only take this medication as prescribed.  If you need help sleeping, you can try over-the-counter melatonin 3 mg nightly.  This is a natural sleep hormone that is safe use.  If you experience chest pain, palpitations, shortness of breath please come to the emergency room immediately.   Increase activity slowly   Complete by:  As directed       Signed: Isabelle Course, MD 05/13/2018, 12:21 PM   Pager: [phone]

## 2018-05-13 NOTE — Progress Notes (Signed)
   Subjective: Patient is alert and oriented and calm this morning.  There was an incident overnight after the patient got to the floor and level of consciousness increased.  She did not understand why she was in the hospital and tried to leave.  Please see Dr. [name] note for full details.  Patient was educated on reasons for her hospital stay at no point was she suicidal and homicidal.  She has 2 kids ages 36 and 50 and she did not know where they were last night.  She wanted to make sure they were with someone safe.  This morning she is feeling well.  Denies chest pain, palpitations, shortness of breath.  Does note slight dizziness with sitting up or standing.  Eating and drinking well.  Does complain of left calf pain.  Objective:  Vital signs in last 24 hours: Vitals:   05/12/18 2026 05/13/18 0006 05/13/18 0348 05/13/18 0800  BP: (!) 151/94 135/86 (!) 152/97 (!) 157/78  Pulse: 70 97 81 79  Resp:  14 13 18   Temp: 98 F (36.7 C) 98.7 F (37.1 C) 98.6 F (37 C) 98.3 F (36.8 C)  TempSrc: Oral Oral Oral Oral  SpO2: 96% 94% 99% 95%  Weight: 92.1 kg     Height: 5\' 3"  (1.6 m)      General: Laying in bed, alert and oriented, no acute distress, sitter at bedside Cardiac: Regular rate and rhythm, no murmurs rubs or gallops Pulmonary: Clear to auscultation bilaterally Abdomen: Soft, nontender Extremities:Left lower calf is tender to palpation and the muscle feels tight, but no swelling, erythema, or warmth.  DP pulses are strong and symmetric bilaterally  Assessment/Plan:  Active Problems:   Acute encephalopathy  1.  Inadvertent Seroquel overdose: Patient's mental status is back to baseline this morning.  EKGs were monitored overnight and QTC remains within normal limits.  Telemetry was personally reviewed and there is no evidence of arrhythmias.  Repeat liver functions this morning are within normal limits.  No other electrolyte abnormalities.  Denies SI or HI.  Patient is safe to  discharge  2.  Homelessness: Social work consulted.  Patient given list of resources.  3.  Type 2 diabetes: Continue home Lantus 45 units, NovoLog sliding scale  4.  Left calf pain: Patient did decline Lovenox yesterday.  However, exam is NOT concerning for DVT.  No further work-up is indicated at this time.  Dispo: Anticipated discharge today.  Ali Lowe, MD 05/13/2018, 10:55 AM [phone]

## 2018-05-13 NOTE — Progress Notes (Signed)
Clinical Social Worker received consult to assist patient with resending her IVC. Per MD patient is being discharge and is no longer needing to be IVC'd. CSW fax over paperwork to the magistrate and placed paperwork on patients chart. CSW also spoke with patient about homeless shelters and encouraged patient to utilize family as a Theatre manager. Patient stated she has young kids that her cousin are keeping until patient can find stable housing. CSW signing off as social work needs have been met.   Kaitlyn Gray, MSW,  [place]

## 2018-05-13 NOTE — Progress Notes (Signed)
Per Dr. Maryelizabeth Kaufmann pt is not suicidal or homicidal and does not require IVC or sitter at bedside. CSW notified to revoke IVC status.

## 2018-05-13 NOTE — Progress Notes (Signed)
Patient awoke when arrived to the unit. She was crying and yelling and wanted something to eat and drink. The writer explain to her that she has an order not to eat, but assured her that she will page the Doctor to get a diet order.  All attempts to calm the patient down failed. She  Removed her IV, took her belongs and left the unit. Security paged, they where able to locate her and brought her back to the unit. IM MD in to assess patient and placed a diet order. Resident ate, fell asleep and is currently in bed with eyes closed. Safety sitter at bedside. Will continue to monitor.

## 2018-06-23 ENCOUNTER — Emergency Department (HOSPITAL_COMMUNITY)
Admission: EM | Admit: 2018-06-23 | Discharge: 2018-06-23 | Disposition: A | Discharge status: Home / Self Care | Payer: Medicaid Other | Attending: Emergency Medicine | Admitting: Emergency Medicine

## 2018-06-23 ENCOUNTER — Encounter (HOSPITAL_COMMUNITY): Payer: Self-pay | Admitting: *Deleted

## 2018-06-23 DIAGNOSIS — Y999 Unspecified external cause status: Secondary | ICD-10-CM | POA: Diagnosis not present

## 2018-06-23 DIAGNOSIS — S0012XA Contusion of left eyelid and periocular area, initial encounter: Secondary | ICD-10-CM | POA: Insufficient documentation

## 2018-06-23 DIAGNOSIS — E119 Type 2 diabetes mellitus without complications: Secondary | ICD-10-CM | POA: Insufficient documentation

## 2018-06-23 DIAGNOSIS — Y939 Activity, unspecified: Secondary | ICD-10-CM | POA: Diagnosis not present

## 2018-06-23 DIAGNOSIS — F17210 Nicotine dependence, cigarettes, uncomplicated: Secondary | ICD-10-CM | POA: Diagnosis not present

## 2018-06-23 DIAGNOSIS — Y929 Unspecified place or not applicable: Secondary | ICD-10-CM | POA: Diagnosis not present

## 2018-06-23 DIAGNOSIS — Z91040 Latex allergy status: Secondary | ICD-10-CM | POA: Diagnosis not present

## 2018-06-23 DIAGNOSIS — T07XXXA Unspecified multiple injuries, initial encounter: Secondary | ICD-10-CM | POA: Diagnosis not present

## 2018-06-23 DIAGNOSIS — Z79899 Other long term (current) drug therapy: Secondary | ICD-10-CM | POA: Insufficient documentation

## 2018-06-23 DIAGNOSIS — Z794 Long term (current) use of insulin: Secondary | ICD-10-CM | POA: Insufficient documentation

## 2018-06-23 DIAGNOSIS — I10 Essential (primary) hypertension: Secondary | ICD-10-CM | POA: Insufficient documentation

## 2018-06-23 DIAGNOSIS — S058X2A Other injuries of left eye and orbit, initial encounter: Secondary | ICD-10-CM | POA: Diagnosis present

## 2018-06-23 MED ORDER — ACETAMINOPHEN 325 MG PO TABS: 650.0000 mg | ORAL_TABLET | Freq: Four times a day (QID) | ORAL | 0 refills | Status: DC | PRN

## 2018-06-23 NOTE — Discharge Instructions (Addendum)
Please read attached information. If you experience any new or worsening signs or symptoms please return to the emergency room for evaluation. Please follow-up with your primary care provider or specialist as discussed. Please use tylenol as needed for pain.  °
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
## 2018-06-23 NOTE — ED Notes (Signed)
Patient is A&Ox4 at this time.  Patient in no signs of distress.  Please see providers note for complete history and physical exam.  
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
## 2018-06-24 ENCOUNTER — Other Ambulatory Visit: Payer: Self-pay

## 2018-06-24 ENCOUNTER — Encounter (HOSPITAL_COMMUNITY): Payer: Self-pay | Admitting: Emergency Medicine

## 2018-06-24 ENCOUNTER — Emergency Department (HOSPITAL_COMMUNITY)
Admission: EM | Admit: 2018-06-24 | Discharge: 2018-06-24 | Disposition: A | Discharge status: Home / Self Care | Payer: Medicaid Other | Attending: Emergency Medicine | Admitting: Emergency Medicine

## 2018-06-24 DIAGNOSIS — Z5321 Procedure and treatment not carried out due to patient leaving prior to being seen by health care provider: Secondary | ICD-10-CM | POA: Diagnosis not present

## 2018-06-24 DIAGNOSIS — F419 Anxiety disorder, unspecified: Secondary | ICD-10-CM | POA: Diagnosis not present

## 2018-06-24 NOTE — ED Notes (Signed)
Pt wanted to go the bathroom. Pt taken in wheelchair.

## 2018-06-24 NOTE — ED Notes (Signed)
Pt came up to desk, asking for a bus pass, this RN informed the pt that she needs to wait to be seen before we can give her a pass, pt seen walking out of ED

## 2018-06-24 NOTE — ED Notes (Signed)
Unable to locate patient. RN in lobby states pt walked out and left.

## 2018-06-24 NOTE — ED Triage Notes (Signed)
Pt states she is having anxiety

## 2018-08-26 ENCOUNTER — Other Ambulatory Visit: Payer: Self-pay

## 2018-08-26 ENCOUNTER — Emergency Department (HOSPITAL_COMMUNITY)
Admission: EM | Admit: 2018-08-26 | Discharge: 2018-08-26 | Disposition: A | Discharge status: Home / Self Care | Payer: Medicaid Other | Attending: Emergency Medicine | Admitting: Emergency Medicine

## 2018-08-26 ENCOUNTER — Emergency Department (HOSPITAL_COMMUNITY): Payer: Medicaid Other

## 2018-08-26 DIAGNOSIS — R079 Chest pain, unspecified: Secondary | ICD-10-CM | POA: Diagnosis present

## 2018-08-26 DIAGNOSIS — Z79899 Other long term (current) drug therapy: Secondary | ICD-10-CM | POA: Diagnosis not present

## 2018-08-26 DIAGNOSIS — Z91040 Latex allergy status: Secondary | ICD-10-CM | POA: Diagnosis not present

## 2018-08-26 DIAGNOSIS — E119 Type 2 diabetes mellitus without complications: Secondary | ICD-10-CM | POA: Diagnosis not present

## 2018-08-26 DIAGNOSIS — Z794 Long term (current) use of insulin: Secondary | ICD-10-CM | POA: Insufficient documentation

## 2018-08-26 DIAGNOSIS — I10 Essential (primary) hypertension: Secondary | ICD-10-CM | POA: Diagnosis not present

## 2018-08-26 DIAGNOSIS — F17210 Nicotine dependence, cigarettes, uncomplicated: Secondary | ICD-10-CM | POA: Insufficient documentation

## 2018-08-26 DIAGNOSIS — R0602 Shortness of breath: Secondary | ICD-10-CM

## 2018-08-26 DIAGNOSIS — R4182 Altered mental status, unspecified: Secondary | ICD-10-CM | POA: Insufficient documentation

## 2018-08-26 LAB — COMPREHENSIVE METABOLIC PANEL
ALK PHOS: 72 U/L (ref 38–126)
ALT: 13 U/L (ref 0–44)
AST: 13 U/L — AB (ref 15–41)
Albumin: 3.2 g/dL — ABNORMAL LOW (ref 3.5–5.0)
Anion gap: 5 (ref 5–15)
BUN: 10 mg/dL (ref 6–20)
CALCIUM: 8.6 mg/dL — AB (ref 8.9–10.3)
CHLORIDE: 107 mmol/L (ref 98–111)
CO2: 26 mmol/L (ref 22–32)
CREATININE: 0.88 mg/dL (ref 0.44–1.00)
GFR calc non Af Amer: >60 mL/min (ref >60)
GLUCOSE: 150 mg/dL — AB (ref 70–99)
Potassium: 3.1 mmol/L — ABNORMAL LOW (ref 3.5–5.1)
SODIUM: 138 mmol/L (ref 135–145)
Total Bilirubin: 0.5 mg/dL (ref 0.3–1.2)
Total Protein: 6.1 g/dL — ABNORMAL LOW (ref 6.5–8.1)

## 2018-08-26 LAB — CBC
HCT: 31.3 % — ABNORMAL LOW (ref 36.0–46.0)
HEMOGLOBIN: 9.2 g/dL — AB (ref 12.0–15.0)
MCH: 24 pg — ABNORMAL LOW (ref 26.0–34.0)
MCHC: 29.4 g/dL — ABNORMAL LOW (ref 30.0–36.0)
MCV: 81.5 fL (ref 80.0–100.0)
NRBC: 0 % (ref 0.0–0.2)
Platelets: 306 10*3/uL (ref 150–400)
RBC: 3.84 MIL/uL — AB (ref 3.87–5.11)
RDW: 16.4 % — ABNORMAL HIGH (ref 11.5–15.5)
WBC: 7.3 10*3/uL (ref 4.0–10.5)

## 2018-08-26 LAB — ETHANOL: Alcohol, Ethyl (B): <10 mg/dL (ref <10)

## 2018-08-26 LAB — I-STAT TROPONIN, ED: Troponin i, poc: 0.01 ng/mL (ref 0.00–0.08)

## 2018-08-26 IMAGING — DX CHEST - 2 VIEW
2 series · 2 of 2 positions shown · non-contrast
Comparison: [DATE] and prior radiograph

CLINICAL DATA: Acute shortness of breath

EXAM:
CHEST - 2 VIEW

[chest pa]
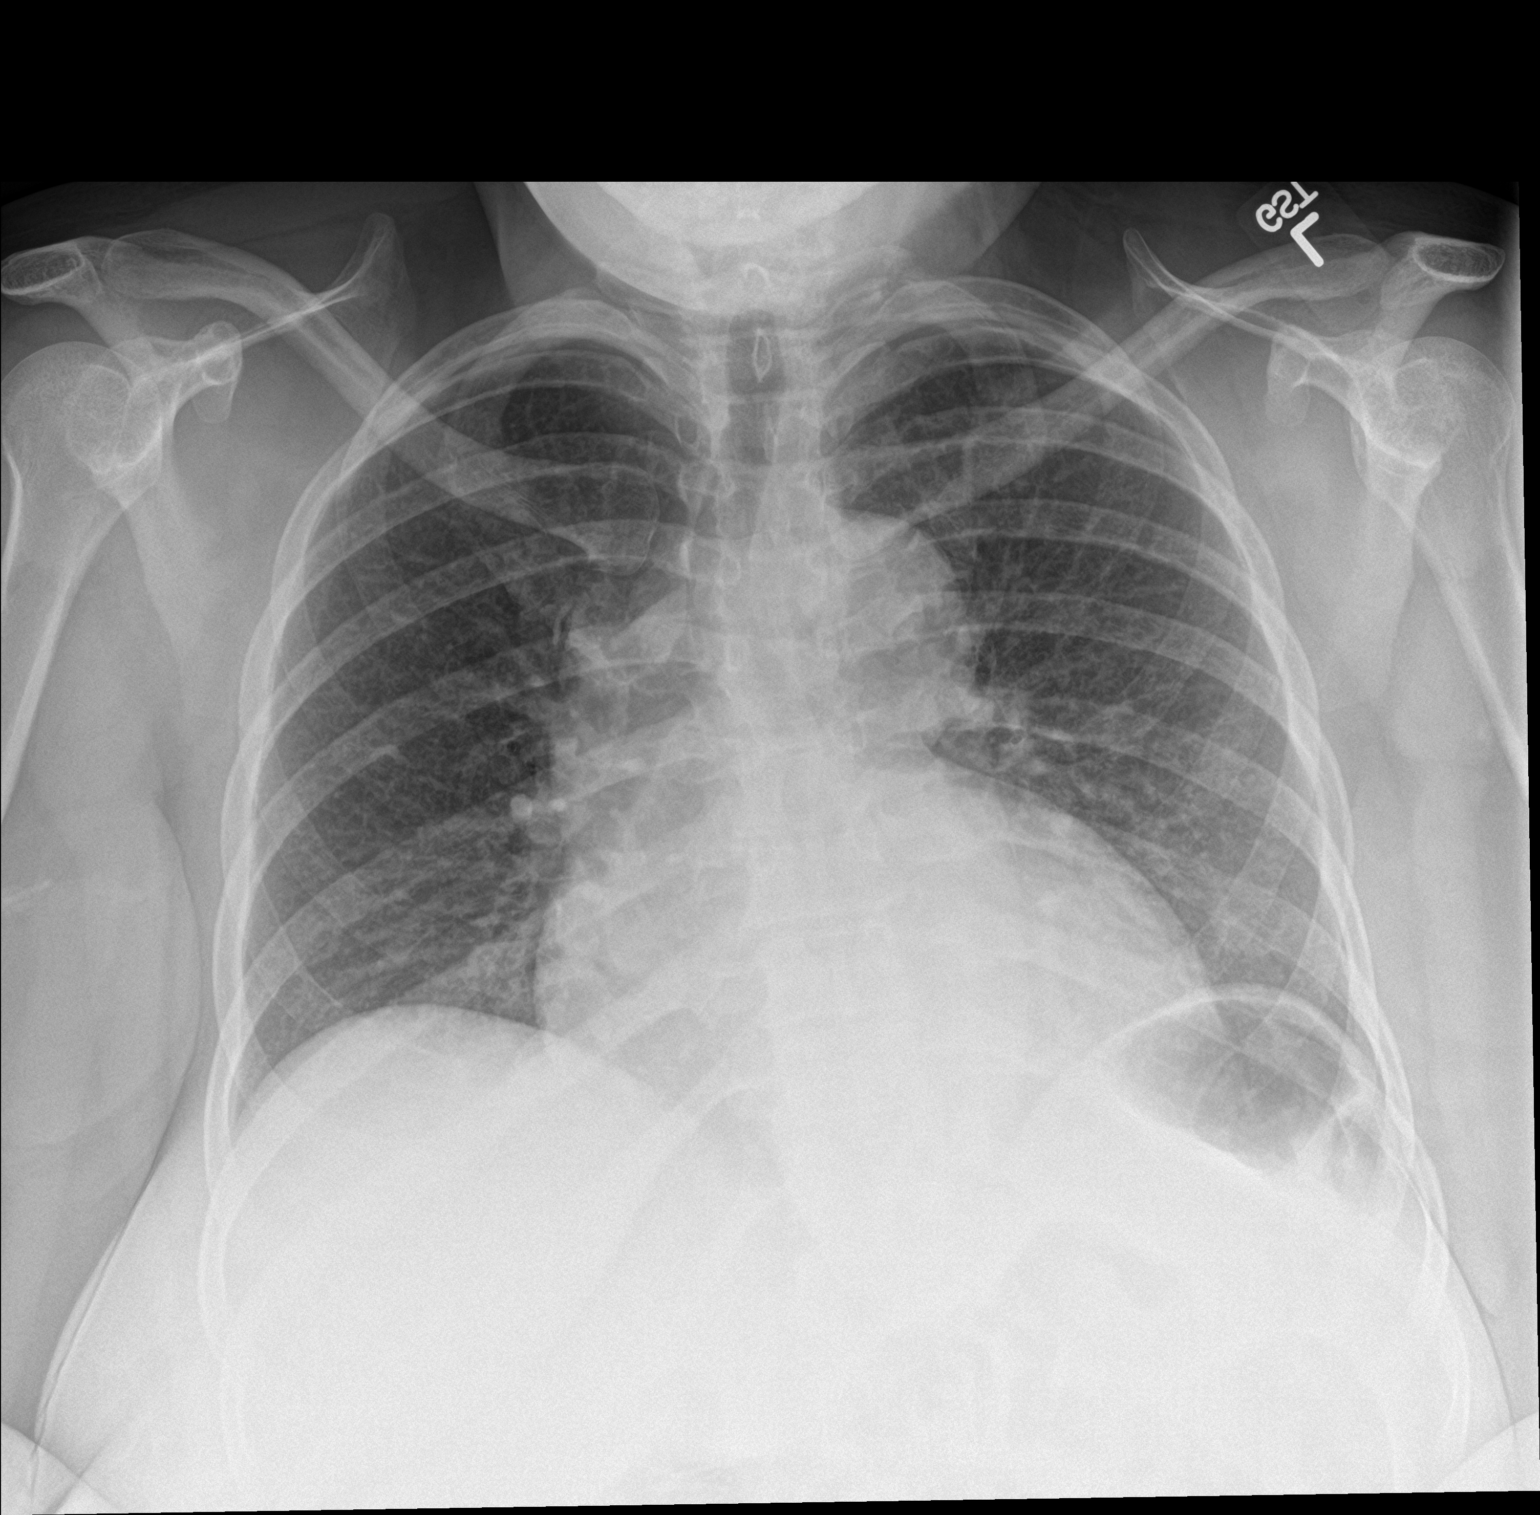

[chest lat]
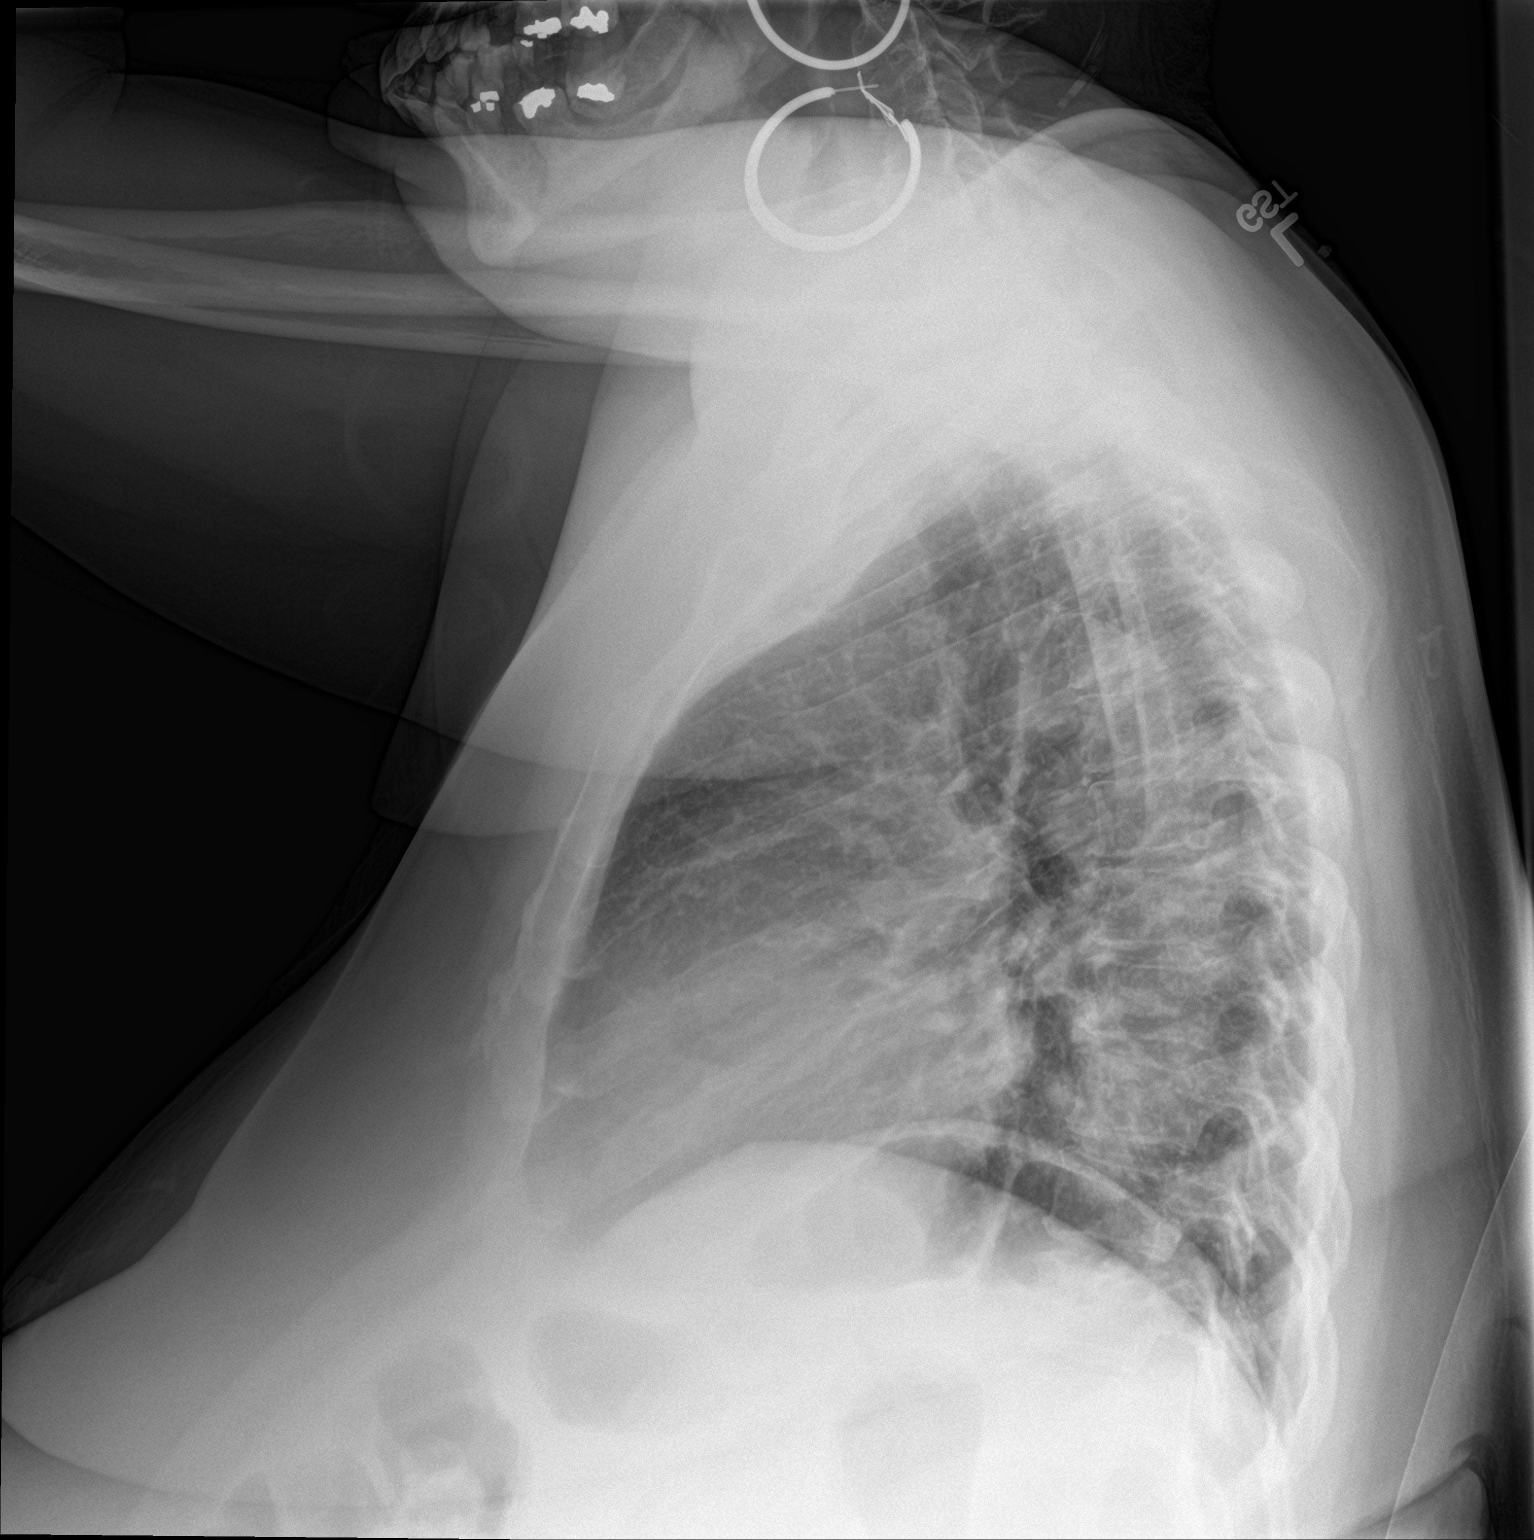

[2 of 2 positions shown; findings below may reference images not displayed]

FINDINGS: Cardiomegaly noted.

Mild peribronchial thickening again noted.

There is no evidence of focal airspace disease, pulmonary edema,
suspicious pulmonary nodule/mass, pleural effusion, or pneumothorax.

No acute bony abnormalities are identified.
IMPRESSION: Cardiomegaly without evidence of acute cardiopulmonary disease.

## 2018-08-26 NOTE — Discharge Instructions (Signed)
Work-up for the chest pain without any acute findings.  Follow-up with your doctors.  Return for any new or worse symptoms.  Take your current medications as directed.

## 2018-08-26 NOTE — ED Notes (Signed)
Patient notified by MD that the plan was to discharge her home. Patient then told MD that she was suicidal and yelled to this RN that she wants a [place] sandwich and ginger ale.

## 2018-08-26 NOTE — ED Notes (Signed)
Patient verbalizes understanding of discharge instructions. Opportunity for questioning and answers were provided. Armband removed by staff, pt discharged from ED.  

## 2018-08-26 NOTE — ED Notes (Signed)
See downtime paperwork.

## 2018-08-26 NOTE — ED Provider Notes (Signed)
[facility] EMERGENCY DEPARTMENT Provider Note   CSN: [id number] Arrival date & time: 08/26/18  7858    History   Chief Complaint No chief complaint on file.   HPI Kaitlyn Gray is a 43 y.o. female.     Patient very drowsy will arouse and wake up.  Patient's complaint was chest pain.   Patient was checked on frequently but remained somnolent but was arousable.  Patient denied any ingestion.     Past Medical History:  Diagnosis Date  . Bipolar 1 disorder ([place])   . 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 ([place]) 07/19/2017  . Hypertension     Patient Active Problem List   Diagnosis Date Noted  . Acute encephalopathy 05/12/2018  . Diabetes mellitus without complication ([place]) 85/07/7739  . Hypokalemia 12/06/2017  . Hypertension 12/06/2017  . 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 ([place]) 07/19/2017  . Bipolar disorder ([place]) 07/19/2017  . Polysubstance abuse ([place]) 07/19/2017  . Chest pain 07/19/2017  . Cocaine use disorder, severe, dependence ([place]) 03/24/2015  . Cannabis use disorder, moderate, dependence ([place]) 03/24/2015  . 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 CESAREAN SECTION       OB History   No obstetric history on file.      Home Medications    Prior to Admission medications   Medication Sig Start Date End Date Taking? Authorizing Provider  ABILIFY MAINTENA 400 MG PRSY prefilled syringe Inject 400 mg into the muscle every 30 (thirty) days. 04/29/18   [provider]  acetaminophen (TYLENOL) 325 MG tablet Take 2 tablets (650 mg total) by mouth every 6 (six) hours as needed. 06/23/18   Hedges, Dellis Filbert, PA-C  blood glucose meter kit and supplies KIT Dispense based on patient and insurance preference. Use up to four times daily as directed. (FOR ICD-9 250.00, 250.01).  Please give 3 months supply of lancets and test strips to allow her to check QID. 12/07/17   Roxan Hockey, MD  blood glucose meter kit and supplies Relion Prime or Dispense other brand based on patient and insurance preference. Use up to four times daily as directed. (FOR ICD-9 250.00, 250.01). 12/07/17   Emokpae, Courage, MD  escitalopram (LEXAPRO) 10 MG tablet Take 10 mg by mouth daily. 04/29/18   [provider]  Insulin Glargine (LANTUS SOLOSTAR) 100 UNIT/ML Solostar Pen Inject 45 Units into the skin every morning. 12/07/17   Emokpae, Courage, MD  insulin lispro (HUMALOG KWIKPEN) 100 UNIT/ML KiwkPen Inject 0-0.2 mLs (0-20 Units total) into the skin 4 (four) times daily -  before meals and at bedtime. Inject 0-20 Units into the skin 3 (three) times daily with meals. CBG 70 - 120: 0 units  CBG 121 - 150: 3 units  CBG 151 - 200: 4 units  CBG 201 - 250: 7 units  CBG 251 - 300: 11 units  CBG 301 - 350: 15 units  CBG 351 - 400: 20 units Patient not taking: Reported on 05/12/2018 12/07/17   Roxan Hockey, MD  Insulin Pen Needle 31G X 5 MM MISC Use as directed 12/07/17   Roxan Hockey, MD  metFORMIN (GLUCOPHAGE) 1000 MG tablet Take 1 tablet (1,000 mg total) by mouth 2 (two) times daily with a meal. Patient taking differently: Take 500 mg by mouth 2 (two) times daily with a meal.  12/07/17 12/07/18  Roxan Hockey, MD  methocarbamol (ROBAXIN) 500 MG tablet Take 500 mg by mouth 2 (two) times daily.    [provider]  NOVOLOG FLEXPEN 100 UNIT/ML FlexPen Inject 8 Units into the skin 3 (three) times daily. 04/28/18   [provider]  QUEtiapine (SEROQUEL) 50 MG tablet Take 50 mg by mouth at bedtime. 04/29/18   [provider]    Family History Family History  Problem Relation Age of Onset  . Hypertension Mother   . CAD Mother 48       died of MI at age 77  . Hypertension Father     Social History Social History   Tobacco Use  . Smoking status:  Current Every Day Smoker    Packs/day: 0.50    Types: Cigarettes  . Smokeless tobacco: Never Used  Substance Use Topics  . Alcohol use: No  . Drug use: Yes    Types: Marijuana, Cocaine     Allergies   Hydrocodone and Latex   Review of Systems Review of Systems  Constitutional: Negative for chills and fever.  HENT: Negative for rhinorrhea and sore throat.   Eyes: Negative for visual disturbance.  Respiratory: Negative for cough and shortness of breath.   Cardiovascular: Positive for chest pain. Negative for leg swelling.  Gastrointestinal: Negative for abdominal pain, diarrhea, nausea and vomiting.  Genitourinary: Negative for dysuria.  Musculoskeletal: Negative for back pain and neck pain.  Skin: Negative for rash.  Neurological: Negative for dizziness, light-headedness and headaches.  Hematological: Does not bruise/bleed easily.  Psychiatric/Behavioral: Negative for confusion.     Physical Exam Updated Vital Signs BP (!) 132/92   Pulse 71   Temp 98.7 F (37.1 C) (Oral)   Resp 15   SpO2 97%   Physical Exam Vitals signs and nursing note reviewed.  Constitutional:      General: She is not in acute distress.    Appearance: She is well-developed.  HENT:     Head: Normocephalic and atraumatic.     Mouth/Throat:     Mouth: Mucous membranes are moist.  Eyes:     Conjunctiva/sclera: Conjunctivae normal.  Neck:     Musculoskeletal: Normal range of motion and neck supple. No neck rigidity.  Cardiovascular:     Rate and Rhythm: Normal rate and regular rhythm.     Heart sounds: No murmur.  Pulmonary:     Effort: Pulmonary effort is normal. No respiratory distress.     Breath sounds: Normal breath sounds.  Abdominal:     General: Bowel sounds are normal.     Palpations: Abdomen is soft.     Tenderness: There is no abdominal tenderness.  Skin:    General: Skin is warm and dry.  Neurological:     General: No focal deficit present.     Mental Status: She is alert  and oriented to person, place, and time.     Comments: Arousable answer questions but then will go back to sleep.  Patient will follow commands.      ED Treatments / Results  Labs (all labs ordered are listed, but only abnormal results are displayed) Labs Reviewed  CBC - Abnormal; Notable for the following components:      Result Value   RBC 3.84 (*)    Hemoglobin 9.2 (*)    HCT 31.3 (*)    MCH 24.0 (*)    MCHC 29.4 (*)    RDW 16.4 (*)    All other components within normal limits  COMPREHENSIVE METABOLIC PANEL -  Abnormal; Notable for the following components:   Potassium 3.1 (*)    Glucose, Bld 150 (*)    Calcium 8.6 (*)    Total Protein 6.1 (*)    Albumin 3.2 (*)    AST 13 (*)    All other components within normal limits  ETHANOL  RAPID URINE DRUG SCREEN, HOSP PERFORMED  I-STAT TROPONIN, ED    EKG EKG Interpretation  Date/Time:  Thursday August 26 2018 07:41:05 EDT Ventricular Rate:  76 PR Interval:    QRS Duration: 110 QT Interval:  433 QTC Calculation: 487 R Axis:   41 Text Interpretation:  Sinus rhythm Borderline prolonged QT interval Confirmed by Fredia Sorrow [id number]) on 08/26/2018 11:08:42 AM   Radiology Dg Chest 2 View  Result Date: 08/26/2018 CLINICAL DATA:  Acute shortness of breath EXAM: CHEST - 2 VIEW COMPARISON:  04/05/2018 and prior radiograph FINDINGS: Cardiomegaly noted. Mild peribronchial thickening again noted. There is no evidence of focal airspace disease, pulmonary edema, suspicious pulmonary nodule/mass, pleural effusion, or pneumothorax. No acute bony abnormalities are identified. IMPRESSION: Cardiomegaly without evidence of acute cardiopulmonary disease. Electronically Signed   By: Margarette Canada M.D.   On: 08/26/2018 12:23    Procedures Procedures (including critical care time)  Medications Ordered in ED Medications - No data to display   Initial Impression / Assessment and Plan / ED Course  I have reviewed the triage vital signs and  the nursing notes.  Pertinent labs & imaging results that were available during my care of the patient were reviewed by me and considered in my medical decision making (see chart for details).       Patient finally did wake up.  Went into go ahead and say that she was cleared for discharge from the chest pain standpoint.  Chest x-ray without any acute findings.  Troponin was negative.  Labs without any significant abnormalities other than slight hypokalemia with a potassium of 3.1.  Patient EKG did have a borderline prolonged QT.  But no other significant abnormalities.  When went to talk to patient she said that she had taken 7 Seroquel yesterday denied it being's suicide attempt.  But stating that she has been depressed and that she wanted to talk to behavioral health.  Based on this protocol and to behavioral health.  Wanted to repeat her EKG but patient refused.  Patient also refused to get urine drug screen.  I said that behavioral health probably will interview her without the sedation need to be medically cleared.  I stated there was some concern about the Seroquel she took and that she may be suicidal.  Patient denied being suicidal on sort of brushed off taking the Seroquel.  Insisted that she wanted to leave.  Patient is very functional.  Patient had mentioned anything about suicidal ideation earlier.  She is now denying any of that.  Patient hemodynamically stable from the Seroquel overdose.  No evidence of any other significant overdose.  Tylenol level was not obtained because patient refused any additional labs.  However most likely patient is okay for discharge home.  I do encourage patient to stay to be interviewed by behavioral health and to allow [place] to do a repeat EKG at least.  But she refused.  Wanted to go home.  Final Clinical Impressions(s) / ED Diagnoses   Final diagnoses:  Chest pain, unspecified type  Altered mental status, unspecified altered mental status type     ED Discharge Orders    None  Fredia Sorrow, MD 08/26/18 [phone]

## 2018-08-26 NOTE — ED Notes (Signed)
Informed pt urine sample is needed; pt reports she "does not need to go" at this time.

## 2018-08-26 NOTE — ED Notes (Signed)
Phlebotomy @ bedside  

## 2018-08-26 NOTE — ED Notes (Signed)
Bus pass given to pt

## 2018-08-26 NOTE — ED Notes (Signed)
ED Provider at bedside. 

## 2019-02-09 ENCOUNTER — Encounter (HOSPITAL_COMMUNITY): Payer: Self-pay | Admitting: Emergency Medicine

## 2019-02-09 ENCOUNTER — Emergency Department (HOSPITAL_COMMUNITY)
Admission: EM | Admit: 2019-02-09 | Discharge: 2019-02-09 | Disposition: A | Discharge status: Home / Self Care | Payer: Medicaid Other | Attending: Emergency Medicine | Admitting: Emergency Medicine

## 2019-02-09 DIAGNOSIS — F17210 Nicotine dependence, cigarettes, uncomplicated: Secondary | ICD-10-CM | POA: Insufficient documentation

## 2019-02-09 DIAGNOSIS — I10 Essential (primary) hypertension: Secondary | ICD-10-CM | POA: Insufficient documentation

## 2019-02-09 DIAGNOSIS — F319 Bipolar disorder, unspecified: Secondary | ICD-10-CM | POA: Diagnosis not present

## 2019-02-09 DIAGNOSIS — E119 Type 2 diabetes mellitus without complications: Secondary | ICD-10-CM | POA: Diagnosis not present

## 2019-02-09 DIAGNOSIS — Z79899 Other long term (current) drug therapy: Secondary | ICD-10-CM | POA: Insufficient documentation

## 2019-02-09 DIAGNOSIS — F419 Anxiety disorder, unspecified: Secondary | ICD-10-CM | POA: Diagnosis present

## 2019-02-09 LAB — CBC WITH DIFFERENTIAL/PLATELET
Abs Immature Granulocytes: 0 10*3/uL (ref 0.00–0.07)
Basophils Absolute: 0.1 10*3/uL (ref 0.0–0.1)
Basophils Relative: 3 %
Eosinophils Absolute: 0.2 10*3/uL (ref 0.0–0.5)
Eosinophils Relative: 4 %
HCT: 30.4 % — ABNORMAL LOW (ref 36.0–46.0)
Hemoglobin: 8.9 g/dL — ABNORMAL LOW (ref 12.0–15.0)
Lymphocytes Relative: 48 %
Lymphs Abs: 2.2 10*3/uL (ref 0.7–4.0)
MCH: 23.2 pg — ABNORMAL LOW (ref 26.0–34.0)
MCHC: 29.3 g/dL — ABNORMAL LOW (ref 30.0–36.0)
MCV: 79.2 fL — ABNORMAL LOW (ref 80.0–100.0)
Monocytes Absolute: 0.3 10*3/uL (ref 0.1–1.0)
Monocytes Relative: 7 %
Neutro Abs: 1.7 10*3/uL (ref 1.7–7.7)
Neutrophils Relative %: 38 %
Platelets: 381 10*3/uL (ref 150–400)
RBC: 3.84 MIL/uL — ABNORMAL LOW (ref 3.87–5.11)
RDW: 17.2 % — ABNORMAL HIGH (ref 11.5–15.5)
WBC: 4.6 10*3/uL (ref 4.0–10.5)
nRBC: 0 % (ref 0.0–0.2)
nRBC: 0 /100 WBC

## 2019-02-09 LAB — BASIC METABOLIC PANEL
Anion gap: 8 (ref 5–15)
BUN: 11 mg/dL (ref 6–20)
CO2: 26 mmol/L (ref 22–32)
Calcium: 8.8 mg/dL — ABNORMAL LOW (ref 8.9–10.3)
Chloride: 107 mmol/L (ref 98–111)
Creatinine, Ser: 0.9 mg/dL (ref 0.44–1.00)
GFR calc Af Amer: >60 mL/min (ref >60)
GFR calc non Af Amer: >60 mL/min (ref >60)
Glucose, Bld: 128 mg/dL — ABNORMAL HIGH (ref 70–99)
Potassium: 3.8 mmol/L (ref 3.5–5.1)
Sodium: 141 mmol/L (ref 135–145)

## 2019-02-09 LAB — URINALYSIS, ROUTINE W REFLEX MICROSCOPIC
Bilirubin Urine: NEGATIVE
Glucose, UA: NEGATIVE mg/dL
Hgb urine dipstick: NEGATIVE
Ketones, ur: NEGATIVE mg/dL
Leukocytes,Ua: NEGATIVE
Nitrite: NEGATIVE
Protein, ur: NEGATIVE mg/dL
Specific Gravity, Urine: 1.019 (ref 1.005–1.030)
pH: 7 (ref 5.0–8.0)

## 2019-02-09 LAB — I-STAT BETA HCG BLOOD, ED (MC, WL, AP ONLY): I-stat hCG, quantitative: <5 m[IU]/mL (ref <5)

## 2019-02-09 LAB — CBG MONITORING, ED: Glucose-Capillary: 173 mg/dL — ABNORMAL HIGH (ref 70–99)

## 2019-02-09 LAB — RAPID URINE DRUG SCREEN, HOSP PERFORMED
Amphetamines: NOT DETECTED
Barbiturates: NOT DETECTED
Benzodiazepines: NOT DETECTED
Cocaine: POSITIVE — AB
Opiates: NOT DETECTED
Tetrahydrocannabinol: POSITIVE — AB

## 2019-02-09 LAB — MAGNESIUM: Magnesium: 2 mg/dL (ref 1.7–2.4)

## 2019-02-09 MED ORDER — LISINOPRIL 10 MG PO TABS
10.0000 mg | ORAL_TABLET | Freq: Once | ORAL | Status: AC
  Administered 2019-02-09: 10 mg via ORAL
  Filled 2019-02-09: qty 1

## 2019-02-09 MED ORDER — HYDROXYZINE HCL 25 MG PO TABS: 25.0000 mg | ORAL_TABLET | Freq: Four times a day (QID) | ORAL | 0 refills | Status: DC

## 2019-02-09 MED ORDER — LISINOPRIL 10 MG PO TABS: 10.0000 mg | ORAL_TABLET | Freq: Every day | ORAL | 0 refills | Status: DC

## 2019-02-09 MED ORDER — SODIUM CHLORIDE 0.9 % IV BOLUS
1000.0000 mL | Freq: Once | INTRAVENOUS | Status: AC
  Administered 2019-02-09: 1000 mL via INTRAVENOUS

## 2019-02-09 NOTE — Discharge Instructions (Addendum)
Please take your lisinopril, as prescribed. Follow-up with a primary care provider for any further management of this issue.

## 2019-02-09 NOTE — ED Notes (Signed)
Patient verbalizes understanding of discharge instructions . Opportunity for questions and answers were provided . Armband removed by staff ,Pt discharged from ED. W/C  offered at D/C  and Declined W/C at D/C and was escorted to lobby by RN.  

## 2019-02-09 NOTE — ED Triage Notes (Signed)
Pt here with c/o anxiety and htn , both of which pt has , not of which she has taken her meds for which she has, c/o sob , no chest pain

## 2019-02-09 NOTE — ED Provider Notes (Signed)
[facility]PART[facility] Provider Note   CSN: [id number] Arrival date & time: 02/09/19  1141     History   Chief Complaint No chief complaint on file.   HPI Kaitlyn Gray is a 43 y.o. female.     HPI   Kaitlyn Gray is a 43 y.o. female, with a history of bipolar, anxiety, DM, HTN, polysubstance abuse, tobacco use, presenting to the ED with feelings of anxiety.  She states she has had anxiousness for well over a year.  Over the last couple weeks she has had increased restlessness and insomnia.  She has not taken her anxiety and depression medications in over a year.   Her blood pressure has been high.  She has not taken her hypertension medications in over a year.  She adds that she eats pretty much whatever she wants and adds a lot of salt and seasoning to her food. She suspects her blood sugar is high.  She urinates frequently and sometimes has overflow incontinence.  She stopped taking her Lantus and NovoLog over a year ago.  She states, "my blood sugars were good so I thought I did not have diabetes anymore so I stopped taking the insulin."  She had cut out sodas and sweet drinks, but has more recently started to drink these again.  She admits to frequent cocaine and marijuana use.  Denies fever/chills, N/V/D, syncope, SI/HI, chest pain, shortness of breath, acute cough, abdominal pain, confusion, or any other complaints.   Past Medical History:  Diagnosis Date  . Bipolar 1 disorder ([place])   . 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 ([place]) 07/19/2017  . Hypertension     Patient Active Problem List   Diagnosis Date Noted  . Acute encephalopathy 05/12/2018  . Diabetes mellitus without complication ([place]) 46/80/3212  . Hypokalemia 12/06/2017  . Hypertension 12/06/2017  . 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 ([place]) 07/19/2017  . Bipolar disorder ([place]) 07/19/2017  . Polysubstance abuse  ([place]) 07/19/2017  . Chest pain 07/19/2017  . Cocaine use disorder, severe, dependence ([place]) 03/24/2015  . Cannabis use disorder, moderate, dependence ([place]) 03/24/2015  . 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 CESAREAN SECTION       OB History   No obstetric history on file.      Home Medications    Prior to Admission medications   Medication Sig Start Date End Date Taking? Authorizing Provider  ABILIFY MAINTENA 400 MG PRSY prefilled syringe Inject 400 mg into the muscle every 30 (thirty) days. 04/29/18   [provider]  acetaminophen (TYLENOL) 325 MG tablet Take 2 tablets (650 mg total) by mouth every 6 (six) hours as needed. 06/23/18   Hedges, Dellis Filbert, PA-C  blood glucose meter kit and supplies KIT Dispense based on patient and insurance preference. Use up to four times daily as directed. (FOR ICD-9 250.00, 250.01). Please give 3 months supply of lancets and test strips to allow her to check QID. 12/07/17   Roxan Hockey, MD  blood glucose meter kit and supplies Relion Prime or Dispense other brand based on patient and insurance preference. Use up to four times daily as directed. (FOR ICD-9 250.00, 250.01). 12/07/17   Emokpae, Courage, MD  escitalopram (LEXAPRO) 10 MG tablet Take 10 mg by mouth daily. 04/29/18   [provider]  hydrOXYzine (ATARAX/VISTARIL) 25 MG tablet Take 1  tablet (25 mg total) by mouth every 6 (six) hours. 02/09/19   ,  C, PA-C  Insulin Glargine (LANTUS SOLOSTAR) 100 UNIT/ML Solostar Pen Inject 45 Units into the skin every morning. 12/07/17   Emokpae, Courage, MD  insulin lispro (HUMALOG KWIKPEN) 100 UNIT/ML KiwkPen Inject 0-0.2 mLs (0-20 Units total) into the skin 4 (four) times daily -  before meals and at bedtime. Inject 0-20 Units into the skin 3 (three) times daily with meals. CBG 70 - 120: 0 units  CBG 121 - 150: 3 units  CBG 151 -  200: 4 units  CBG 201 - 250: 7 units  CBG 251 - 300: 11 units  CBG 301 - 350: 15 units  CBG 351 - 400: 20 units Patient not taking: Reported on 05/12/2018 12/07/17   Roxan Hockey, MD  Insulin Pen Needle 31G X 5 MM MISC Use as directed 12/07/17   Roxan Hockey, MD  lisinopril (ZESTRIL) 10 MG tablet Take 1 tablet (10 mg total) by mouth daily. 02/09/19 03/11/19  ,  C, PA-C  metFORMIN (GLUCOPHAGE) 1000 MG tablet Take 1 tablet (1,000 mg total) by mouth 2 (two) times daily with a meal. Patient taking differently: Take 500 mg by mouth 2 (two) times daily with a meal.  12/07/17 12/07/18  Roxan Hockey, MD  methocarbamol (ROBAXIN) 500 MG tablet Take 500 mg by mouth 2 (two) times daily.    [provider]  NOVOLOG FLEXPEN 100 UNIT/ML FlexPen Inject 8 Units into the skin 3 (three) times daily. 04/28/18   [provider]  QUEtiapine (SEROQUEL) 50 MG tablet Take 50 mg by mouth at bedtime. 04/29/18   [provider]    Family History Family History  Problem Relation Age of Onset  . Hypertension Mother   . CAD Mother 71       died of MI at age 26  . Hypertension Father     Social History Social History   Tobacco Use  . Smoking status: Current Every Day Smoker    Packs/day: 0.50    Types: Cigarettes  . Smokeless tobacco: Never Used  Substance Use Topics  . Alcohol use: No  . Drug use: Yes    Types: Marijuana, Cocaine     Allergies   Hydrocodone and Latex   Review of Systems Review of Systems  Constitutional: Negative for chills, diaphoresis and fever.  Eyes: Negative for visual disturbance.  Respiratory: Negative for cough and shortness of breath.   Cardiovascular: Negative for chest pain.  Gastrointestinal: Negative for abdominal pain, diarrhea, nausea and vomiting.  Endocrine: Positive for polyuria.  Neurological: Negative for dizziness, syncope, weakness, light-headedness, numbness and headaches.  Psychiatric/Behavioral: Positive for sleep  disturbance. Negative for confusion and suicidal ideas. The patient is nervous/anxious.   All other systems reviewed and are negative.    Physical Exam Updated Vital Signs BP (!) 172/95 (BP Location: Right Arm)   Pulse 77   Temp 98.2 F (36.8 C) (Oral)   Resp 20   SpO2 96%   Physical Exam Vitals signs and nursing note reviewed.  Constitutional:      General: She is not in acute distress.    Appearance: She is well-developed. She is not diaphoretic.  HENT:     Head: Normocephalic and atraumatic.     Mouth/Throat:     Mouth: Mucous membranes are moist.     Pharynx: Oropharynx is clear.  Eyes:     Conjunctiva/sclera: Conjunctivae normal.  Neck:     Musculoskeletal: Neck  supple.  Cardiovascular:     Rate and Rhythm: Normal rate and regular rhythm.     Pulses: Normal pulses.          Radial pulses are 2+ on the right side and 2+ on the left side.       Posterior tibial pulses are 2+ on the right side and 2+ on the left side.     Heart sounds: Normal heart sounds.     Comments: Tactile temperature in the extremities appropriate and equal bilaterally. Pulmonary:     Effort: Pulmonary effort is normal. No respiratory distress.     Breath sounds: Normal breath sounds.  Abdominal:     Palpations: Abdomen is soft.     Tenderness: There is no abdominal tenderness. There is no guarding.  Musculoskeletal:     Right lower leg: No edema.     Left lower leg: No edema.  Lymphadenopathy:     Cervical: No cervical adenopathy.  Skin:    General: Skin is warm and dry.  Neurological:     Mental Status: She is alert and oriented to person, place, and time.     Comments: Sensation grossly intact to light touch in the extremities.  Grip strengths equal bilaterally.  Strength 5/5 in all extremities. No gait disturbance. Coordination intact. Cranial nerves III-XII grossly intact. No facial droop.   Psychiatric:        Mood and Affect: Mood and affect normal.        Speech: Speech normal.         Behavior: Behavior normal.      ED Treatments / Results  Labs (all labs ordered are listed, but only abnormal results are displayed) Labs Reviewed  BASIC METABOLIC PANEL - Abnormal; Notable for the following components:      Result Value   Glucose, Bld 128 (*)    Calcium 8.8 (*)    All other components within normal limits  CBC WITH DIFFERENTIAL/PLATELET - Abnormal; Notable for the following components:   RBC 3.84 (*)    Hemoglobin 8.9 (*)    HCT 30.4 (*)    MCV 79.2 (*)    MCH 23.2 (*)    MCHC 29.3 (*)    RDW 17.2 (*)    All other components within normal limits  RAPID URINE DRUG SCREEN, HOSP PERFORMED - Abnormal; Notable for the following components:   Cocaine POSITIVE (*)    Tetrahydrocannabinol POSITIVE (*)    All other components within normal limits  CBG MONITORING, ED - Abnormal; Notable for the following components:   Glucose-Capillary 173 (*)    All other components within normal limits  URINALYSIS, ROUTINE W REFLEX MICROSCOPIC  MAGNESIUM  I-STAT BETA HCG BLOOD, ED ([facility], [facility], [facility] ONLY)   Hemoglobin  Date Value Ref Range Status  02/09/2019 8.9 (L) 12.0 - 15.0 g/dL Final  08/26/2018 9.2 (L) 12.0 - 15.0 g/dL Final  05/13/2018 9.8 (L) 12.0 - 15.0 g/dL Final  05/12/2018 10.8 (L) 12.0 - 15.0 g/dL Final    EKG EKG Interpretation  Date/Time:  Wednesday February 09 2019 15:04:39 EDT Ventricular Rate:  70 PR Interval:    QRS Duration: 107 QT Interval:  440 QTC Calculation: 475 R Axis:   34 Text Interpretation:  Sinus rhythm Minimal ST elevation, anterior leads similar to prior 3/20 Confirmed by Aletta Edouard [phone]) on 02/09/2019 3:07:36 PM   Radiology No results found.  Procedures Procedures (including critical care time)  Medications Ordered in ED Medications  sodium chloride  0.9 % bolus 1,000 mL (0 mLs Intravenous Stopped 02/09/19 1701)  lisinopril (ZESTRIL) tablet 10 mg (10 mg Oral Given 02/09/19 1642)     Initial Impression / Assessment and Plan /  ED Course  I have reviewed the triage vital signs and the nursing notes.  Pertinent labs & imaging results that were available during my care of the patient were reviewed by me and considered in my medical decision making (see chart for details).  Clinical Course as of Feb 08 1722  Wed Feb 09, 2019  1620 Patient states she feels much better.  She is smiling and sitting up in bed.   [SJ]    Clinical Course User Index [SJ] Tuwana Kapaun C, PA-C       Patient presents with a few chronic complaints.  We evaluated her blood glucose and noted no signs of DKA. She did have hypertension, however, my suspicion for hypertensive emergency is low.  Lab work overall stable. Hemoglobin consistent with previous. We discussed dietary changes as well as avoiding illicit drugs.  We discussed options for control of her blood pressure.  We discussed that typically we do not start hypertension medications in the ED.  However, since patient was previously prescribed lisinopril and the patient and her significant other at the bedside are so worried about the blood pressure, we had a shared decision-making conversation regarding restarting the lisinopril.  She was previously taking 20 mg daily.  We will start her on a more conservative 10 mg daily. PCP follow-up.  Resources given. The patient was given instructions for home care as well as return precautions. Patient voices understanding of these instructions, accepts the plan, and is comfortable with discharge.    Vitals:   02/09/19 1545 02/09/19 1600 02/09/19 1615 02/09/19 1630  BP: (!) 152/98 (!) 167/99    Pulse:      Resp: 14 18 (!) 25 (!) 26  Temp:      TempSrc:      SpO2:         Final Clinical Impressions(s) / ED Diagnoses   Final diagnoses:  Hypertension, unspecified type    ED Discharge Orders         Ordered    lisinopril (ZESTRIL) 10 MG tablet  Daily     02/09/19 1639    hydrOXYzine (ATARAX/VISTARIL) 25 MG tablet  Every 6 hours      02/09/19 1639           Lorayne Bender, PA-C 02/09/19 1725    Hayden Rasmussen, MD 02/10/19 1327

## 2019-02-09 NOTE — ED Triage Notes (Signed)
Pt reports current use of Cannabis and cocaine .

## 2019-03-30 ENCOUNTER — Emergency Department (HOSPITAL_COMMUNITY)
Admission: EM | Admit: 2019-03-30 | Discharge: 2019-03-30 | Discharge status: Against Medical Advice | Payer: Medicaid Other

## 2019-03-30 ENCOUNTER — Other Ambulatory Visit: Payer: Self-pay

## 2019-03-30 NOTE — ED Notes (Signed)
Called for triage x2, no answer.

## 2019-03-30 NOTE — ED Notes (Signed)
Called for triage x1, no answer 

## 2019-09-04 ENCOUNTER — Emergency Department (HOSPITAL_COMMUNITY)
Admission: EM | Admit: 2019-09-04 | Discharge: 2019-09-04 | Disposition: A | Discharge status: Home / Self Care | Payer: Medicaid Other | Attending: Emergency Medicine | Admitting: Emergency Medicine

## 2019-09-04 ENCOUNTER — Encounter (HOSPITAL_COMMUNITY): Payer: Self-pay | Admitting: Emergency Medicine

## 2019-09-04 ENCOUNTER — Other Ambulatory Visit: Payer: Self-pay

## 2019-09-04 DIAGNOSIS — F17210 Nicotine dependence, cigarettes, uncomplicated: Secondary | ICD-10-CM | POA: Insufficient documentation

## 2019-09-04 DIAGNOSIS — Z794 Long term (current) use of insulin: Secondary | ICD-10-CM | POA: Diagnosis not present

## 2019-09-04 DIAGNOSIS — Z79899 Other long term (current) drug therapy: Secondary | ICD-10-CM | POA: Diagnosis not present

## 2019-09-04 DIAGNOSIS — E1165 Type 2 diabetes mellitus with hyperglycemia: Secondary | ICD-10-CM | POA: Diagnosis not present

## 2019-09-04 DIAGNOSIS — I10 Essential (primary) hypertension: Secondary | ICD-10-CM | POA: Insufficient documentation

## 2019-09-04 DIAGNOSIS — Z91040 Latex allergy status: Secondary | ICD-10-CM | POA: Insufficient documentation

## 2019-09-04 DIAGNOSIS — R358 Other polyuria: Secondary | ICD-10-CM | POA: Diagnosis present

## 2019-09-04 DIAGNOSIS — R739 Hyperglycemia, unspecified: Secondary | ICD-10-CM

## 2019-09-04 LAB — I-STAT BETA HCG BLOOD, ED (MC, WL, AP ONLY): I-stat hCG, quantitative: <5 m[IU]/mL (ref <5)

## 2019-09-04 LAB — BASIC METABOLIC PANEL
Anion gap: 8 (ref 5–15)
BUN: 12 mg/dL (ref 6–20)
CO2: 23 mmol/L (ref 22–32)
Calcium: 9.1 mg/dL (ref 8.9–10.3)
Chloride: 105 mmol/L (ref 98–111)
Creatinine, Ser: 0.81 mg/dL (ref 0.44–1.00)
GFR calc Af Amer: >60 mL/min (ref >60)
GFR calc non Af Amer: >60 mL/min (ref >60)
Glucose, Bld: 303 mg/dL — ABNORMAL HIGH (ref 70–99)
Potassium: 4 mmol/L (ref 3.5–5.1)
Sodium: 136 mmol/L (ref 135–145)

## 2019-09-04 LAB — CBG MONITORING, ED: Glucose-Capillary: 281 mg/dL — ABNORMAL HIGH (ref 70–99)

## 2019-09-04 LAB — CBC
HCT: 31.4 % — ABNORMAL LOW (ref 36.0–46.0)
Hemoglobin: 9.1 g/dL — ABNORMAL LOW (ref 12.0–15.0)
MCH: 23 pg — ABNORMAL LOW (ref 26.0–34.0)
MCHC: 29 g/dL — ABNORMAL LOW (ref 30.0–36.0)
MCV: 79.3 fL — ABNORMAL LOW (ref 80.0–100.0)
Platelets: 349 10*3/uL (ref 150–400)
RBC: 3.96 MIL/uL (ref 3.87–5.11)
RDW: 17.5 % — ABNORMAL HIGH (ref 11.5–15.5)
WBC: 7 10*3/uL (ref 4.0–10.5)
nRBC: 0 % (ref 0.0–0.2)

## 2019-09-04 MED ORDER — METFORMIN HCL 1000 MG PO TABS: 1000.0000 mg | ORAL_TABLET | Freq: Two times a day (BID) | ORAL | 1 refills | Status: DC

## 2019-09-04 MED ORDER — SODIUM CHLORIDE 0.9 % IV BOLUS
500.0000 mL | Freq: Once | INTRAVENOUS | Status: AC
  Administered 2019-09-04: 500 mL via INTRAVENOUS

## 2019-09-04 MED ORDER — SODIUM CHLORIDE 0.9 % IV BOLUS: 1000.0000 mL | Freq: Once | INTRAVENOUS | Status: DC

## 2019-09-04 MED ORDER — FLUCONAZOLE 150 MG PO TABS: 150.0000 mg | ORAL_TABLET | Freq: Every day | ORAL | 0 refills | Status: DC

## 2019-09-04 MED ORDER — LISINOPRIL 10 MG PO TABS: 10.0000 mg | ORAL_TABLET | Freq: Every day | ORAL | 1 refills | Status: DC

## 2019-09-04 MED ORDER — FLUCONAZOLE 150 MG PO TABS: 150.0000 mg | ORAL_TABLET | Freq: Every day | ORAL | 0 refills | Status: AC

## 2019-09-04 NOTE — ED Triage Notes (Signed)
Pt reports hx of diabetes which she is no longer on medications for (pcp took her off meds about 1 year ago) she states she began having urinary frequency and polydipsia last night. She also reports blurred vision. Decided to check her blood sugar due to symptoms and cbg read "high" a/ox4.

## 2019-09-04 NOTE — Discharge Instructions (Signed)
Return for any problem.  Follow-up with your regular care provider as instructed.  Resume taking lisinopril and metformin as prescribed.  It is important for you to have routine primary medical care -- please take the time to make sure that this occurs.

## 2019-09-04 NOTE — ED Provider Notes (Signed)
[facility] EMERGENCY DEPARTMENT Provider Note   CSN: [id number] Arrival date & time: 09/04/19  1150     History Chief Complaint  Patient presents with  . Hyperglycemia    Kaitlyn Gray is a 44 y.o. female.  44 year old female with prior medical history as detailed below presents for evaluation of polyuria, polydipsia, and elevated blood sugar.  Patient reports that she had been on medications to control her sugars up until about a year ago.  She reports that her PCP stop these medications at that time.  Patient reports that over the last several days she has noticed polyuria, polydipsia, and this morning she checked her sugar and it was reading "high".    She denies associated fever, chest pain, shortness of breath, abdominal pain, vomiting, nausea, or other specific complaint.  The history is provided by the patient and medical records.  Hyperglycemia Blood sugar level PTA:  "high" Severity:  Moderate Onset quality:  Unable to specify Timing:  Unable to specify Progression:  Unchanged Chronicity:  New Relieved by:  Nothing Ineffective treatments:  None tried      Past Medical History:  Diagnosis Date  . Bipolar 1 disorder ([place])   . 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 ([place]) 07/19/2017  . Hypertension     Patient Active Problem List   Diagnosis Date Noted  . Acute encephalopathy 05/12/2018  . Diabetes mellitus without complication ([place]) 01/75/1025  . Hypokalemia 12/06/2017  . Hypertension 12/06/2017  . 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 ([place]) 07/19/2017  . Bipolar disorder ([place]) 07/19/2017  . Polysubstance abuse ([place]) 07/19/2017  . Chest pain 07/19/2017  . Cocaine use disorder, severe, dependence ([place]) 03/24/2015  . Cannabis use disorder, moderate, dependence ([place]) 03/24/2015  . 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 CESAREAN SECTION       OB History   No obstetric history on file.     Family History  Problem Relation Age of Onset  . Hypertension Mother   . CAD Mother 96       died of MI at age 68  . Hypertension Father     Social History   Tobacco Use  . Smoking status: Current Every Day Smoker    Packs/day: 0.50    Types: Cigarettes  . Smokeless tobacco: Never Used  Substance Use Topics  . Alcohol use: No  . Drug use: Yes    Types: Marijuana, Cocaine    Home Medications Prior to Admission medications   Medication Sig Start Date End Date Taking? Authorizing Provider  ABILIFY MAINTENA 400 MG PRSY prefilled syringe Inject 400 mg into the muscle every 30 (thirty) days. 04/29/18   [provider]  acetaminophen (TYLENOL) 325 MG tablet Take 2 tablets (650 mg total) by mouth every 6 (six) hours as needed. 06/23/18   Hedges, Dellis Filbert, PA-C  blood glucose meter kit and supplies KIT Dispense based on patient and insurance preference. Use up to four times daily as directed. (FOR ICD-9 250.00, 250.01). Please give 3 months supply of lancets and test strips to allow her to check QID. 12/07/17   Roxan Hockey, MD  blood glucose meter kit and supplies Relion Prime or Dispense other brand based on patient and insurance preference. Use up to four times daily as directed. (FOR ICD-9 250.00, 250.01). 12/07/17   Roxan Hockey, MD  escitalopram (LEXAPRO)  10 MG tablet Take 10 mg by mouth daily. 04/29/18   [provider]  hydrOXYzine (ATARAX/VISTARIL) 25 MG tablet Take 1 tablet (25 mg total) by mouth every 6 (six) hours. 02/09/19   Joy, Shawn C, PA-C  Insulin Glargine (LANTUS SOLOSTAR) 100 UNIT/ML Solostar Pen Inject 45 Units into the skin every morning. 12/07/17   Emokpae, Courage, MD  insulin lispro (HUMALOG KWIKPEN) 100 UNIT/ML KiwkPen Inject 0-0.2 mLs (0-20 Units total) into the skin 4 (four) times daily -  before meals and at  bedtime. Inject 0-20 Units into the skin 3 (three) times daily with meals. CBG 70 - 120: 0 units  CBG 121 - 150: 3 units  CBG 151 - 200: 4 units  CBG 201 - 250: 7 units  CBG 251 - 300: 11 units  CBG 301 - 350: 15 units  CBG 351 - 400: 20 units Patient not taking: Reported on 05/12/2018 12/07/17   Roxan Hockey, MD  Insulin Pen Needle 31G X 5 MM MISC Use as directed 12/07/17   Roxan Hockey, MD  lisinopril (ZESTRIL) 10 MG tablet Take 1 tablet (10 mg total) by mouth daily. 02/09/19 03/11/19  Joy, Shawn C, PA-C  metFORMIN (GLUCOPHAGE) 1000 MG tablet Take 1 tablet (1,000 mg total) by mouth 2 (two) times daily with a meal. Patient taking differently: Take 500 mg by mouth 2 (two) times daily with a meal.  12/07/17 12/07/18  Roxan Hockey, MD  methocarbamol (ROBAXIN) 500 MG tablet Take 500 mg by mouth 2 (two) times daily.    [provider]  NOVOLOG FLEXPEN 100 UNIT/ML FlexPen Inject 8 Units into the skin 3 (three) times daily. 04/28/18   [provider]  QUEtiapine (SEROQUEL) 50 MG tablet Take 50 mg by mouth at bedtime. 04/29/18   [provider]    Allergies    Hydrocodone and Latex  Review of Systems   Review of Systems  All other systems reviewed and are negative.   Physical Exam Updated Vital Signs BP (!) 153/75   Pulse 80   Temp 97.7 F (36.5 C) (Oral)   Resp 20   SpO2 98%   Physical Exam Vitals and nursing note reviewed.  Constitutional:      General: She is not in acute distress.    Appearance: Normal appearance. She is well-developed.  HENT:     Head: Normocephalic and atraumatic.  Eyes:     Conjunctiva/sclera: Conjunctivae normal.     Pupils: Pupils are equal, round, and reactive to light.  Cardiovascular:     Rate and Rhythm: Normal rate and regular rhythm.     Heart sounds: Normal heart sounds.  Pulmonary:     Effort: Pulmonary effort is normal. No respiratory distress.     Breath sounds: Normal breath sounds.  Abdominal:      General: There is no distension.     Palpations: Abdomen is soft.     Tenderness: There is no abdominal tenderness.  Musculoskeletal:        General: No deformity. Normal range of motion.     Cervical back: Normal range of motion and neck supple.  Skin:    General: Skin is warm and dry.  Neurological:     General: No focal deficit present.     Mental Status: She is alert and oriented to person, place, and time.     ED Results / Procedures / Treatments   Labs (all labs ordered are listed, but only abnormal results are displayed) Labs Reviewed  BASIC METABOLIC PANEL - Abnormal; Notable for the following components:      Result Value   Glucose, Bld 303 (*)    All other components within normal limits  CBC - Abnormal; Notable for the following components:   Hemoglobin 9.1 (*)    HCT 31.4 (*)    MCV 79.3 (*)    MCH 23.0 (*)    MCHC 29.0 (*)    RDW 17.5 (*)    All other components within normal limits  CBG MONITORING, ED - Abnormal; Notable for the following components:   Glucose-Capillary 281 (*)    All other components within normal limits  URINALYSIS, ROUTINE W REFLEX MICROSCOPIC  I-STAT BETA HCG BLOOD, ED ([facility], [facility], [facility] ONLY)    EKG None  Radiology No results found.  Procedures Procedures (including critical care time)  Medications Ordered in ED Medications  sodium chloride 0.9 % bolus 500 mL (500 mLs Intravenous New Bag/Given 09/04/19 1239)    ED Course  I have reviewed the triage vital signs and the nursing notes.  Pertinent labs & imaging results that were available during my care of the patient were reviewed by me and considered in my medical decision making (see chart for details).    MDM Rules/Calculators/A&P                      MDM  Screen complete  Kaitlyn Gray was evaluated in Emergency Department on 09/04/2019 for the symptoms described in the history of present illness. She was evaluated in the context of the global COVID-19 pandemic, which  necessitated consideration that the patient might be at risk for infection with the SARS-CoV-2 virus that causes COVID-19. Institutional protocols and algorithms that pertain to the evaluation of patients at risk for COVID-19 are in a state of rapid change based on information released by regulatory bodies including the CDC and federal and state organizations. These policies and algorithms were followed during the patient's care in the ED.   Patient is presenting for evaluation of reported hyperglycemia.  Patient appears to be out of most of her medications.  I suspect that her hyperglycemia is more chronic than acute.  Screening labs obtained are without significant abnormality.  Following her ED evaluation the patient does desire discharge.  She does understand the need for close follow-up.  Strict return precautions given and understood.    Patient will be prescribed both lisinopril and metformin as she has requested refills.  She is advised that very close follow-up with a primary care physician is needed to appropriately manage her blood pressure and diabetes.   Final Clinical Impression(s) / ED Diagnoses Final diagnoses:  Hyperglycemia    Rx / DC Orders ED Discharge Orders         Ordered    lisinopril (ZESTRIL) 10 MG tablet  Daily     09/04/19 1436    metFORMIN (GLUCOPHAGE) 1000 MG tablet  2 times daily     09/04/19 1436    fluconazole (DIFLUCAN) 150 MG tablet  Daily     09/04/19 1436           Valarie Merino, MD 09/04/19 1438

## 2019-11-28 ENCOUNTER — Emergency Department (HOSPITAL_COMMUNITY)
Admission: EM | Admit: 2019-11-28 | Discharge: 2019-11-29 | Disposition: A | Discharge status: Home / Self Care | Payer: Medicaid Other | Attending: Emergency Medicine | Admitting: Emergency Medicine

## 2019-11-28 ENCOUNTER — Emergency Department (HOSPITAL_COMMUNITY): Payer: Medicaid Other

## 2019-11-28 ENCOUNTER — Encounter (HOSPITAL_COMMUNITY): Payer: Self-pay | Admitting: Emergency Medicine

## 2019-11-28 ENCOUNTER — Other Ambulatory Visit: Payer: Self-pay

## 2019-11-28 DIAGNOSIS — Z5321 Procedure and treatment not carried out due to patient leaving prior to being seen by health care provider: Secondary | ICD-10-CM | POA: Insufficient documentation

## 2019-11-28 DIAGNOSIS — R0789 Other chest pain: Secondary | ICD-10-CM | POA: Insufficient documentation

## 2019-11-28 DIAGNOSIS — H538 Other visual disturbances: Secondary | ICD-10-CM | POA: Diagnosis not present

## 2019-11-28 LAB — I-STAT BETA HCG BLOOD, ED (MC, WL, AP ONLY): I-stat hCG, quantitative: <5 m[IU]/mL (ref <5)

## 2019-11-28 LAB — CBC
HCT: 31.1 % — ABNORMAL LOW (ref 36.0–46.0)
Hemoglobin: 9.2 g/dL — ABNORMAL LOW (ref 12.0–15.0)
MCH: 23.6 pg — ABNORMAL LOW (ref 26.0–34.0)
MCHC: 29.6 g/dL — ABNORMAL LOW (ref 30.0–36.0)
MCV: 79.7 fL — ABNORMAL LOW (ref 80.0–100.0)
Platelets: 295 10*3/uL (ref 150–400)
RBC: 3.9 MIL/uL (ref 3.87–5.11)
RDW: 18.6 % — ABNORMAL HIGH (ref 11.5–15.5)
WBC: 5.2 10*3/uL (ref 4.0–10.5)
nRBC: 0 % (ref 0.0–0.2)

## 2019-11-28 LAB — BASIC METABOLIC PANEL
Anion gap: 8 (ref 5–15)
BUN: 11 mg/dL (ref 6–20)
CO2: 27 mmol/L (ref 22–32)
Calcium: 9.2 mg/dL (ref 8.9–10.3)
Chloride: 105 mmol/L (ref 98–111)
Creatinine, Ser: 0.98 mg/dL (ref 0.44–1.00)
GFR calc Af Amer: >60 mL/min (ref >60)
GFR calc non Af Amer: >60 mL/min (ref >60)
Glucose, Bld: 179 mg/dL — ABNORMAL HIGH (ref 70–99)
Potassium: 3.4 mmol/L — ABNORMAL LOW (ref 3.5–5.1)
Sodium: 140 mmol/L (ref 135–145)

## 2019-11-28 LAB — TROPONIN I (HIGH SENSITIVITY): Troponin I (High Sensitivity): 8 ng/L (ref <18)

## 2019-11-28 LAB — CBG MONITORING, ED: Glucose-Capillary: 177 mg/dL — ABNORMAL HIGH (ref 70–99)

## 2019-11-28 IMAGING — CR DG CHEST 2V
2 series · 2 of 2 positions shown · non-contrast
Comparison: [DATE]

CLINICAL DATA: Chest pain

EXAM:
CHEST - 2 VIEW

[chest lat]
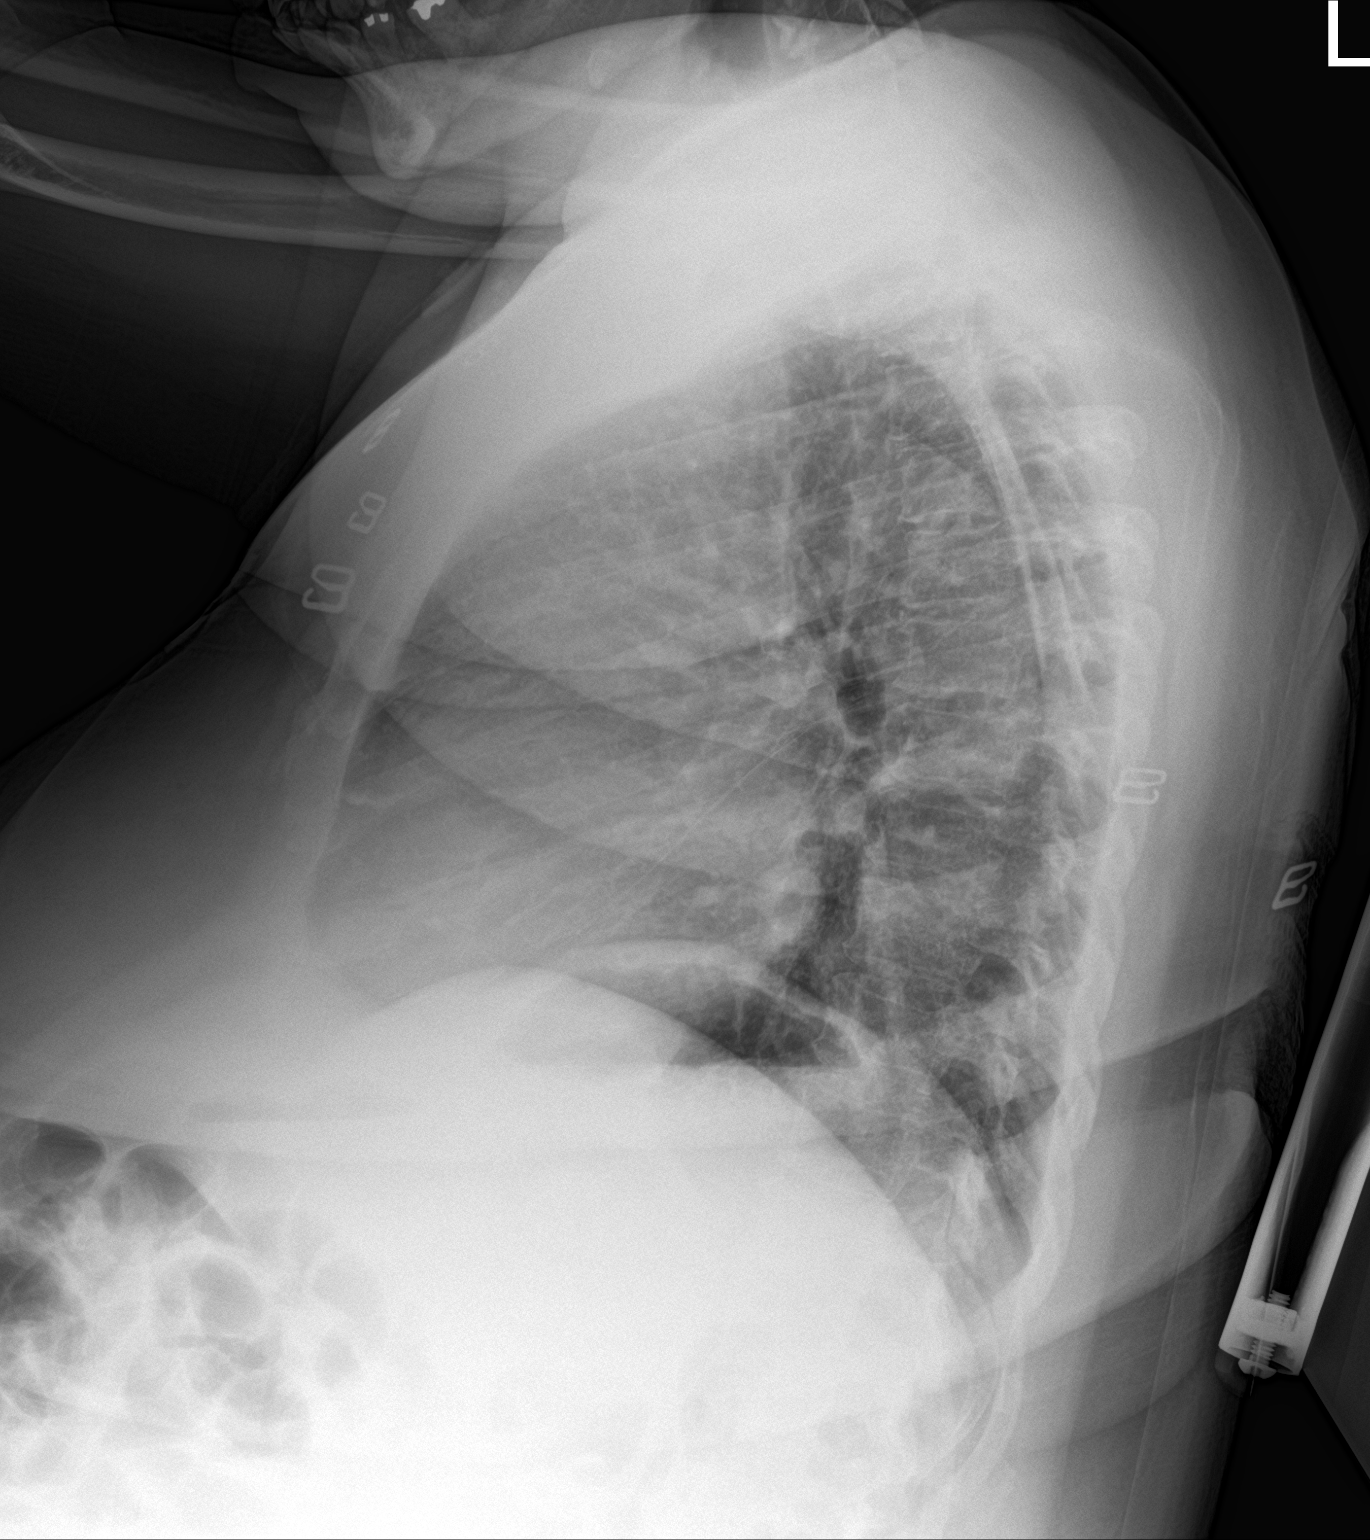

[chest ap]
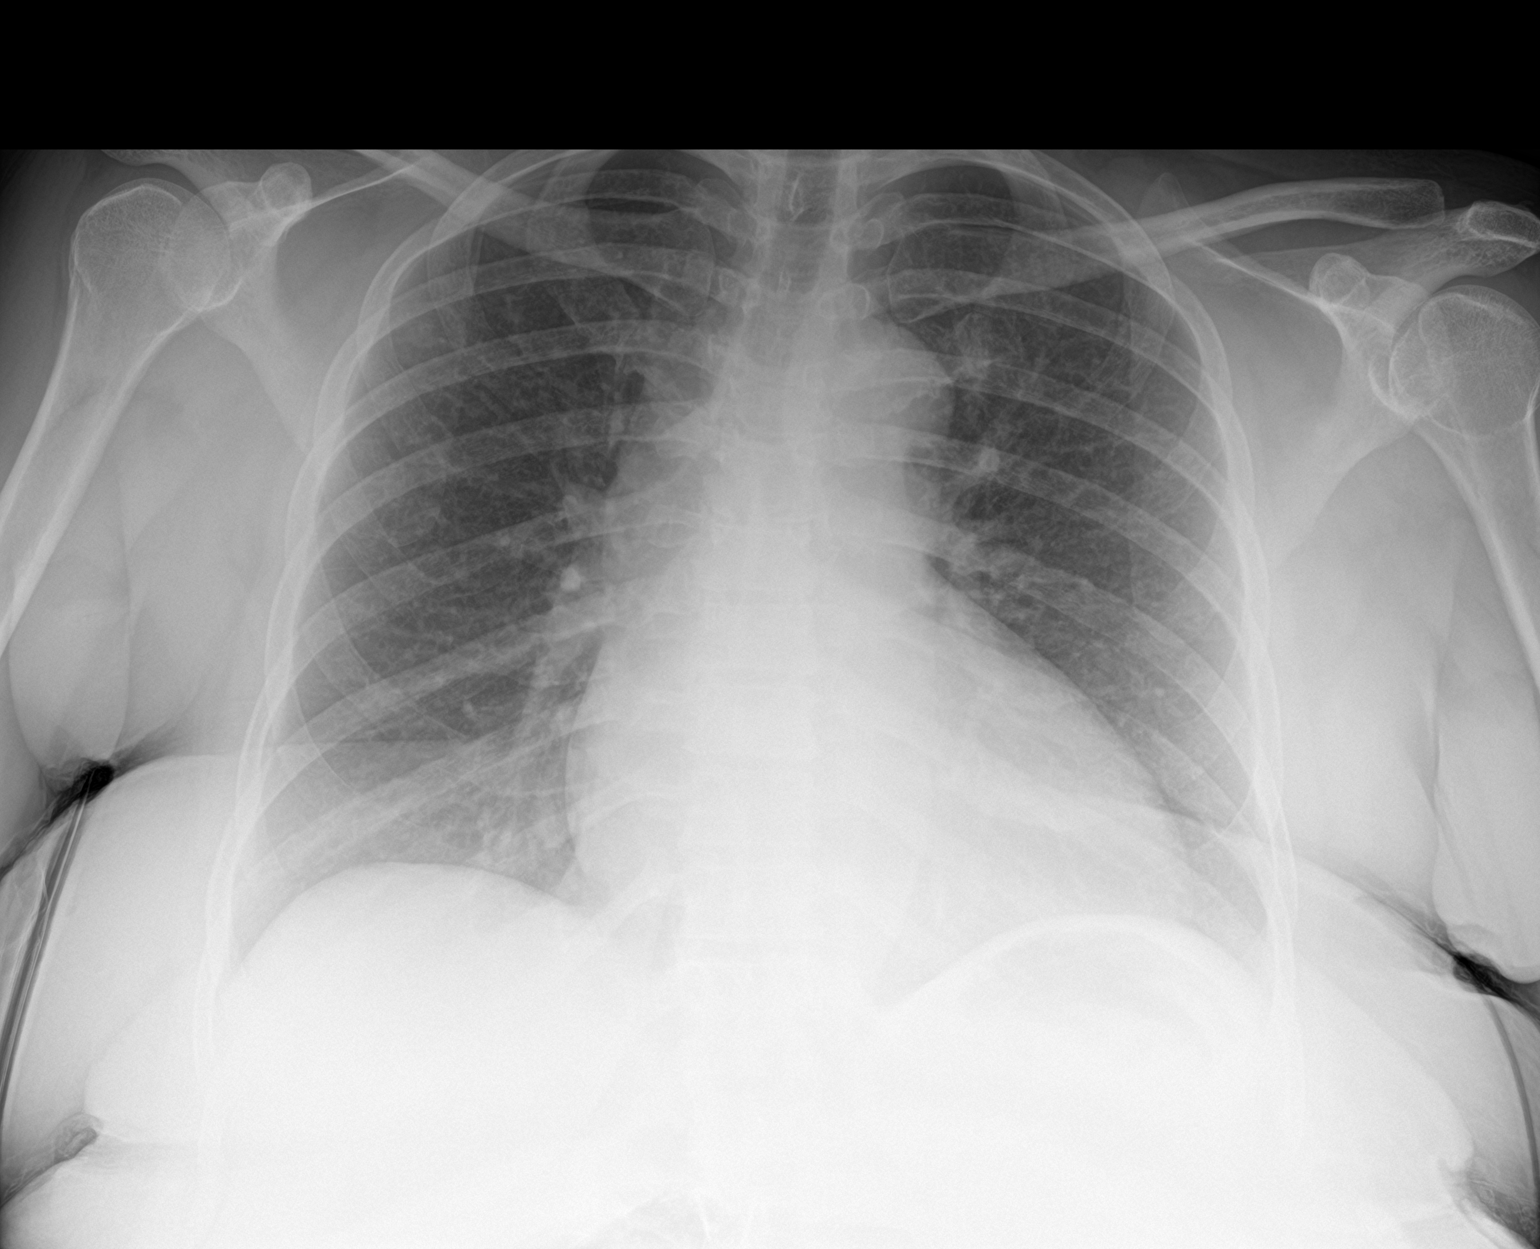

[2 of 2 positions shown; findings below may reference images not displayed]

FINDINGS: There is unchanged mild cardiomegaly. Both lungs are clear. The
visualized skeletal structures are unremarkable.
IMPRESSION: No active cardiopulmonary disease.

## 2019-11-28 MED ORDER — SODIUM CHLORIDE 0.9% FLUSH: 3.0000 mL | Freq: Once | INTRAVENOUS | Status: DC

## 2019-11-28 NOTE — ED Triage Notes (Signed)
Pt c/o chest pain that started x 2 hours ago. Pt also c/o right arm numbness and blurred vision that started when she woke up this morning.

## 2019-11-29 LAB — TROPONIN I (HIGH SENSITIVITY): Troponin I (High Sensitivity): 7 ng/L (ref <18)

## 2019-12-16 ENCOUNTER — Emergency Department (HOSPITAL_COMMUNITY): Payer: Medicaid Other

## 2019-12-16 ENCOUNTER — Encounter (HOSPITAL_COMMUNITY): Payer: Self-pay

## 2019-12-16 ENCOUNTER — Other Ambulatory Visit: Payer: Self-pay

## 2019-12-16 ENCOUNTER — Emergency Department (HOSPITAL_COMMUNITY)
Admission: EM | Admit: 2019-12-16 | Discharge: 2019-12-16 | Disposition: A | Discharge status: Home / Self Care | Payer: Medicaid Other | Attending: Emergency Medicine | Admitting: Emergency Medicine

## 2019-12-16 DIAGNOSIS — Z20822 Contact with and (suspected) exposure to covid-19: Secondary | ICD-10-CM | POA: Diagnosis not present

## 2019-12-16 DIAGNOSIS — R4182 Altered mental status, unspecified: Secondary | ICD-10-CM | POA: Diagnosis present

## 2019-12-16 DIAGNOSIS — Z794 Long term (current) use of insulin: Secondary | ICD-10-CM | POA: Insufficient documentation

## 2019-12-16 DIAGNOSIS — Z91040 Latex allergy status: Secondary | ICD-10-CM | POA: Insufficient documentation

## 2019-12-16 DIAGNOSIS — F17210 Nicotine dependence, cigarettes, uncomplicated: Secondary | ICD-10-CM | POA: Insufficient documentation

## 2019-12-16 DIAGNOSIS — E119 Type 2 diabetes mellitus without complications: Secondary | ICD-10-CM | POA: Insufficient documentation

## 2019-12-16 DIAGNOSIS — I10 Essential (primary) hypertension: Secondary | ICD-10-CM | POA: Diagnosis not present

## 2019-12-16 DIAGNOSIS — Z79899 Other long term (current) drug therapy: Secondary | ICD-10-CM | POA: Insufficient documentation

## 2019-12-16 DIAGNOSIS — R404 Transient alteration of awareness: Secondary | ICD-10-CM | POA: Diagnosis not present

## 2019-12-16 LAB — SARS CORONAVIRUS 2 BY RT PCR (HOSPITAL ORDER, PERFORMED IN ~~LOC~~ HOSPITAL LAB): SARS Coronavirus 2: NEGATIVE

## 2019-12-16 LAB — COMPREHENSIVE METABOLIC PANEL
ALT: 14 U/L (ref 0–44)
AST: 18 U/L (ref 15–41)
Albumin: 3.7 g/dL (ref 3.5–5.0)
Alkaline Phosphatase: 70 U/L (ref 38–126)
Anion gap: 11 (ref 5–15)
BUN: 8 mg/dL (ref 6–20)
CO2: 20 mmol/L — ABNORMAL LOW (ref 22–32)
Calcium: 8.9 mg/dL (ref 8.9–10.3)
Chloride: 107 mmol/L (ref 98–111)
Creatinine, Ser: 1.02 mg/dL — ABNORMAL HIGH (ref 0.44–1.00)
GFR calc Af Amer: >60 mL/min (ref >60)
GFR calc non Af Amer: >60 mL/min (ref >60)
Glucose, Bld: 144 mg/dL — ABNORMAL HIGH (ref 70–99)
Potassium: 3.7 mmol/L (ref 3.5–5.1)
Sodium: 138 mmol/L (ref 135–145)
Total Bilirubin: 0.7 mg/dL (ref 0.3–1.2)
Total Protein: 7.2 g/dL (ref 6.5–8.1)

## 2019-12-16 LAB — CBC WITH DIFFERENTIAL/PLATELET
Abs Immature Granulocytes: 0.02 10*3/uL (ref 0.00–0.07)
Basophils Absolute: 0 10*3/uL (ref 0.0–0.1)
Basophils Relative: 1 %
Eosinophils Absolute: 0.1 10*3/uL (ref 0.0–0.5)
Eosinophils Relative: 1 %
HCT: 31.7 % — ABNORMAL LOW (ref 36.0–46.0)
Hemoglobin: 9.4 g/dL — ABNORMAL LOW (ref 12.0–15.0)
Immature Granulocytes: 0 %
Lymphocytes Relative: 24 %
Lymphs Abs: 1.4 10*3/uL (ref 0.7–4.0)
MCH: 23.5 pg — ABNORMAL LOW (ref 26.0–34.0)
MCHC: 29.7 g/dL — ABNORMAL LOW (ref 30.0–36.0)
MCV: 79.3 fL — ABNORMAL LOW (ref 80.0–100.0)
Monocytes Absolute: 0.3 10*3/uL (ref 0.1–1.0)
Monocytes Relative: 6 %
Neutro Abs: 3.9 10*3/uL (ref 1.7–7.7)
Neutrophils Relative %: 68 %
Platelets: 372 10*3/uL (ref 150–400)
RBC: 4 MIL/uL (ref 3.87–5.11)
RDW: 18.1 % — ABNORMAL HIGH (ref 11.5–15.5)
WBC: 5.7 10*3/uL (ref 4.0–10.5)
nRBC: 0 % (ref 0.0–0.2)

## 2019-12-16 LAB — MAGNESIUM: Magnesium: 2 mg/dL (ref 1.7–2.4)

## 2019-12-16 LAB — CBG MONITORING, ED: Glucose-Capillary: 149 mg/dL — ABNORMAL HIGH (ref 70–99)

## 2019-12-16 LAB — ACETAMINOPHEN LEVEL: Acetaminophen (Tylenol), Serum: <10 ug/mL — ABNORMAL LOW (ref 10–30)

## 2019-12-16 LAB — ETHANOL: Alcohol, Ethyl (B): <10 mg/dL (ref <10)

## 2019-12-16 LAB — I-STAT BETA HCG BLOOD, ED (MC, WL, AP ONLY): I-stat hCG, quantitative: <5 m[IU]/mL (ref <5)

## 2019-12-16 LAB — SALICYLATE LEVEL: Salicylate Lvl: <7 mg/dL — ABNORMAL LOW (ref 7.0–30.0)

## 2019-12-16 IMAGING — DX DG CHEST 1V PORT
1 series · 1 of 1 positions shown · non-contrast
Comparison: [DATE] chest radiograph.

CLINICAL DATA: Altered mental status.

EXAM:
PORTABLE CHEST 1 VIEW

[chest]
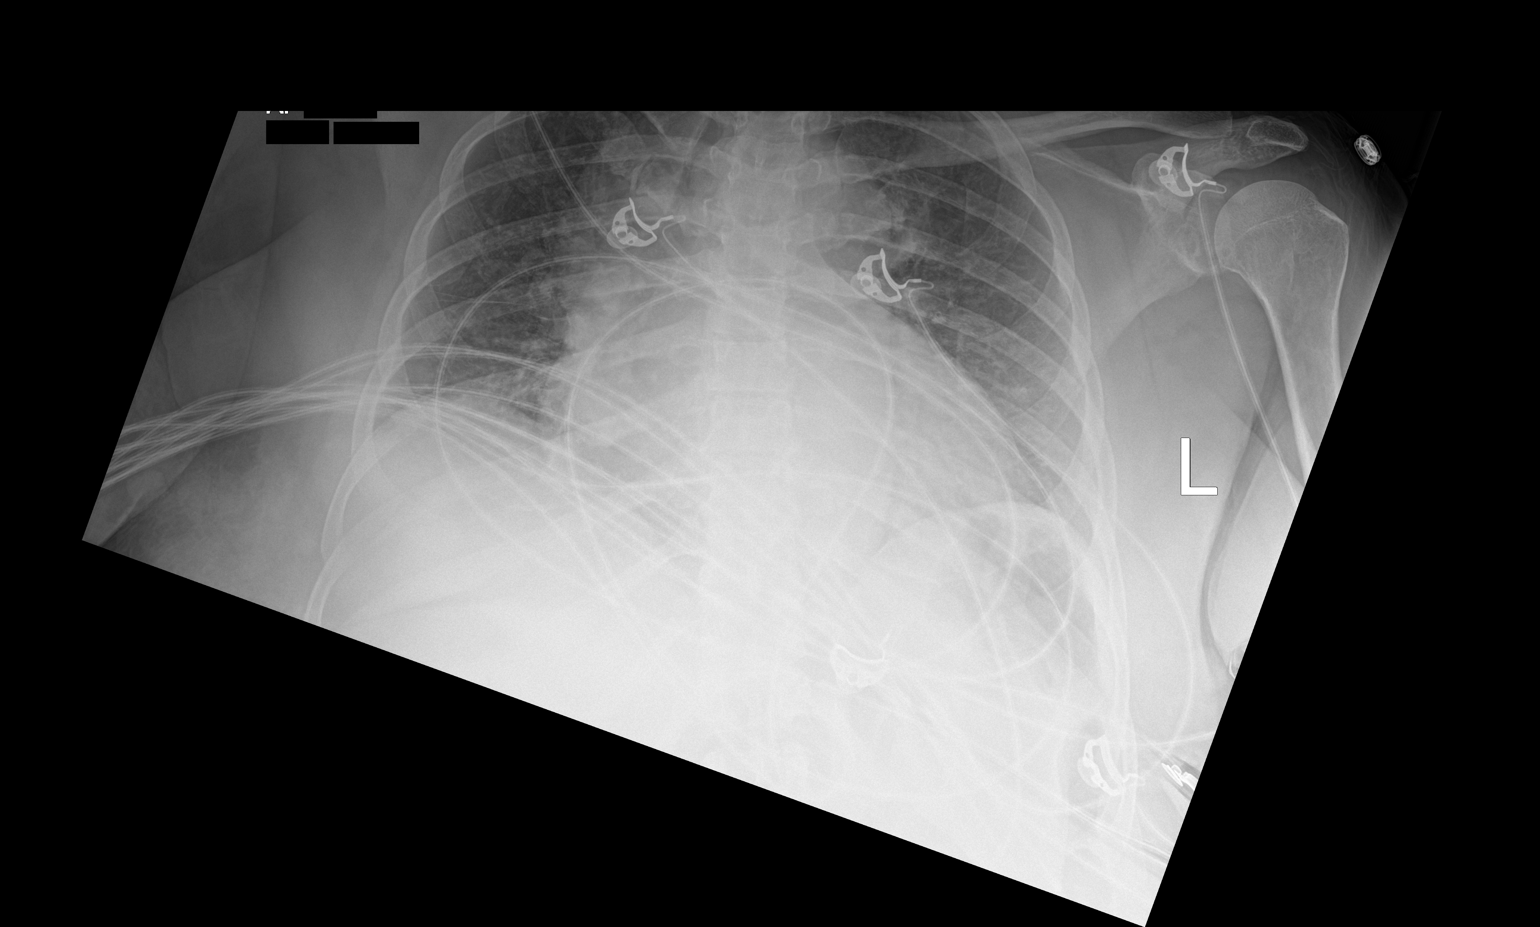

[1 of 1 positions shown; findings below may reference images not displayed]

FINDINGS: Hypoinflated lungs. Cardiomegaly, unchanged. Central pulmonary
vascular congestion.

New perihilar and bibasilar patchy opacities. No pneumothorax or
pleural effusion.

Osseous structures are unchanged.
IMPRESSION: New central pulmonary vascular congestion. Minimal perihilar and
bibasilar opacities, atelectasis versus minimal pulmonary edema.

Cardiomegaly, unchanged.

## 2019-12-16 IMAGING — CT CT HEAD W/O CM
5 of 6 series · 18 of 47 positions shown, 19 images · non-contrast
Comparison: [DATE]

CLINICAL DATA: Recent seizure activity

EXAM:
CT HEAD WITHOUT CONTRAST
TECHNIQUE: Contiguous axial images were obtained from the base of the skull
through the vertex without intravenous contrast.

[Series 3: head bone · axial · 0.45mm/px · z∈[-144,-16]mm · 8 of 81 slices shown]
[im 9/81  bone]
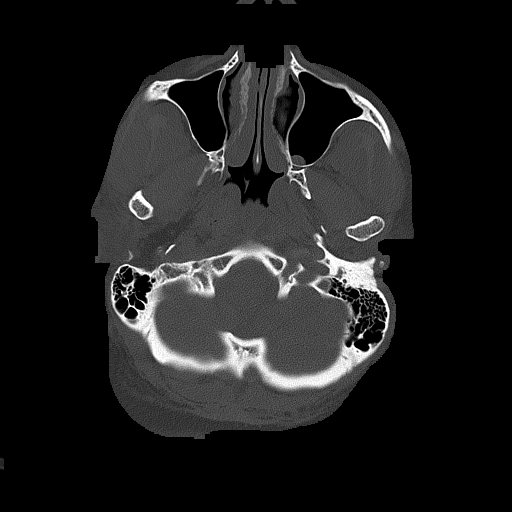
[im 17/81  bone]
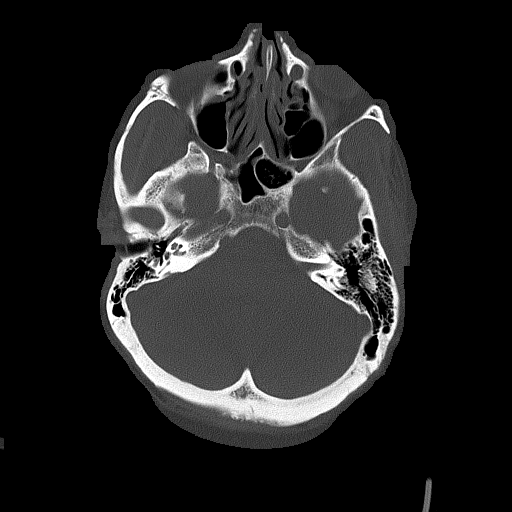
[im 25/81  bone]
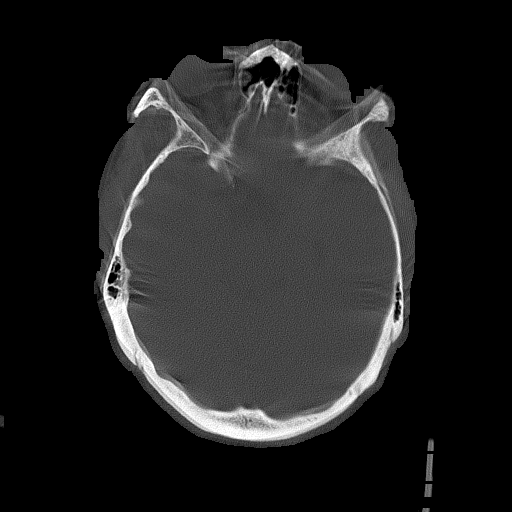
[im 33/81  bone]
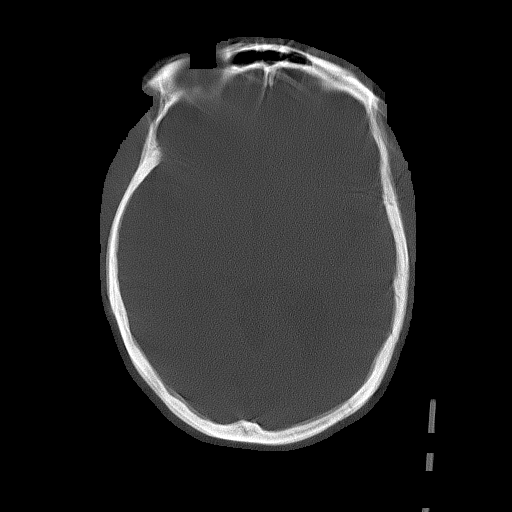
[im 49/81  bone]
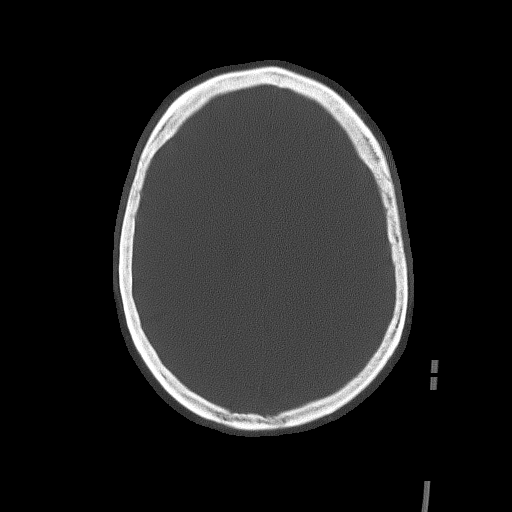
[im 57/81  bone]
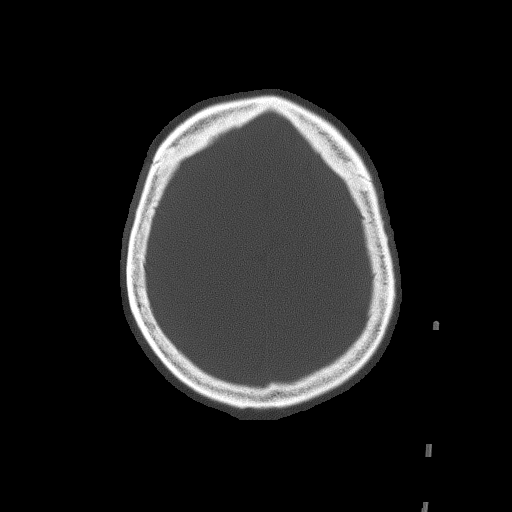
[im 65/81  bone]
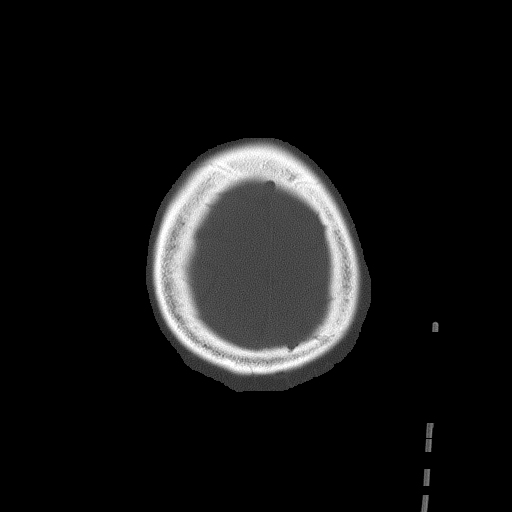
[im 73/81  bone]
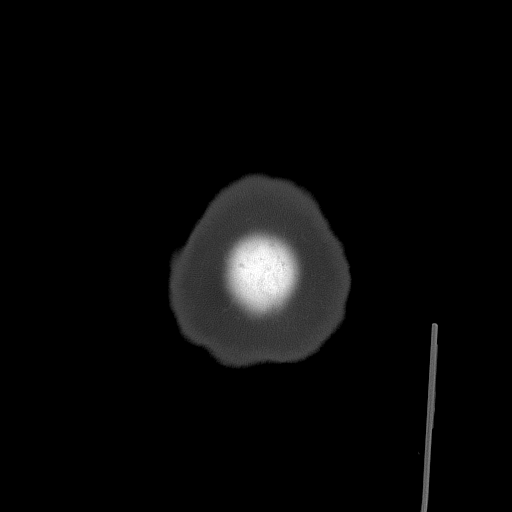

[Series 4: head without · axial · non-contrast · 0.45mm/px · z∈[-110,-54]mm · 2 of 33 slices shown, 3 images (1 of 2)]
[im 11/33  brain]
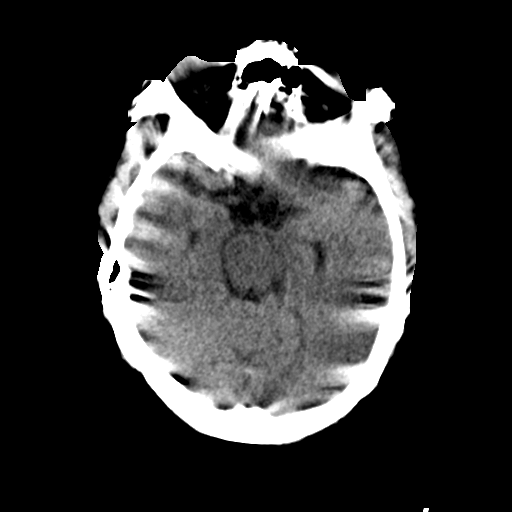
[im 11/33  bone]
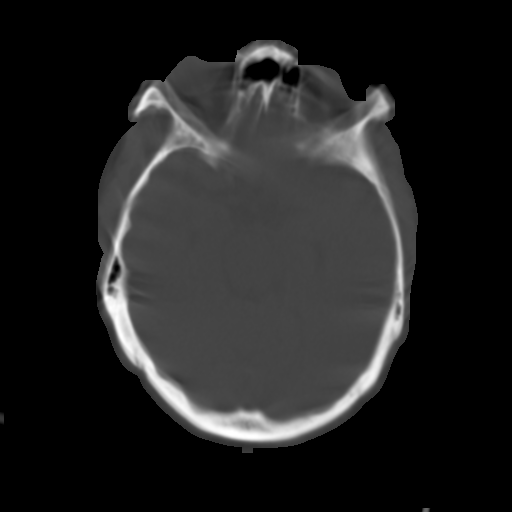
[im 22/33  brain]
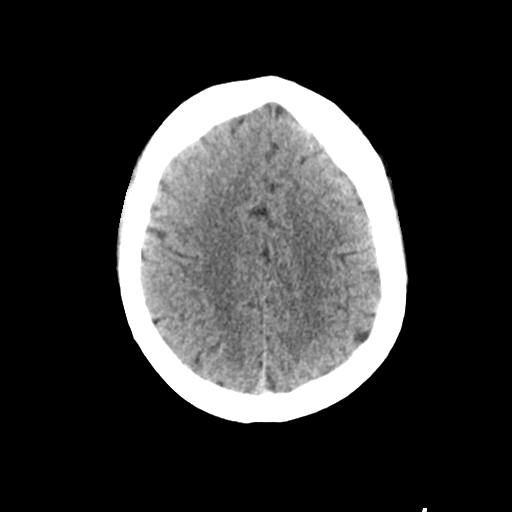

[Series 5: head without · axial · non-contrast · 0.45mm/px · z∈[-110,-60]mm · 2 of 32 slices shown (2 of 2)]
[im 11/32  brain]
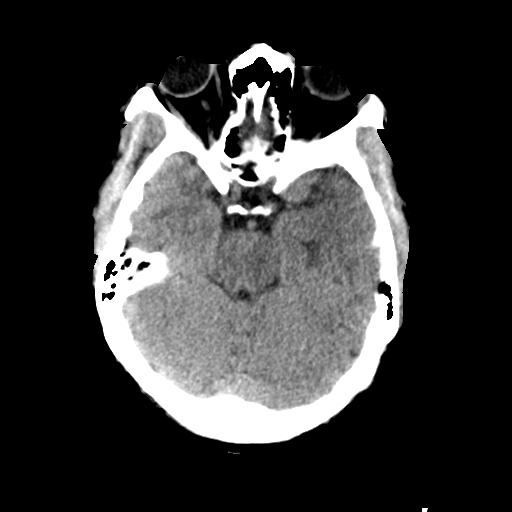
[im 21/32  brain]
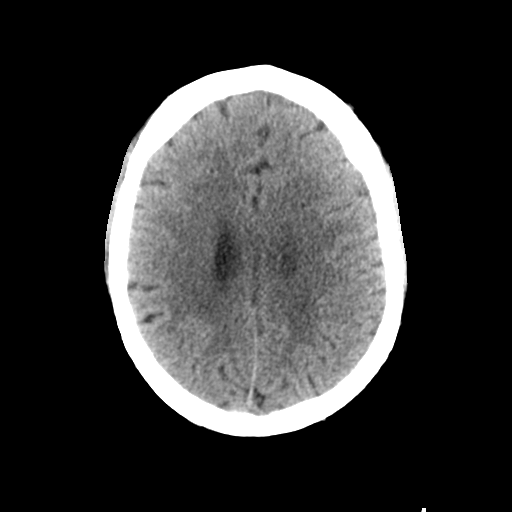

[Series 7: head without cor · coronal · non-contrast · 0.31mm/px · 3 of 67 slices shown]
[im 23/67  brain]
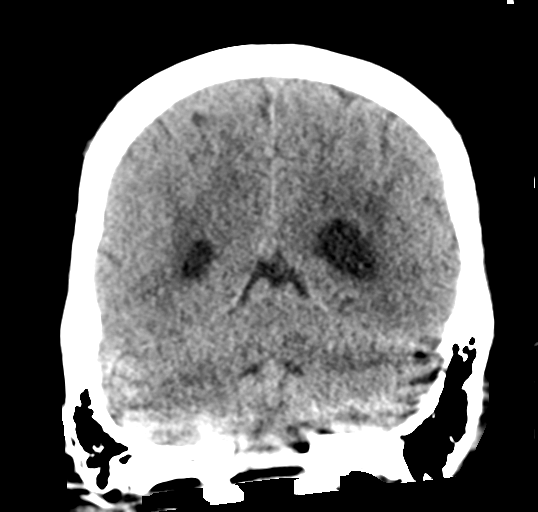
[im 30/67  brain]
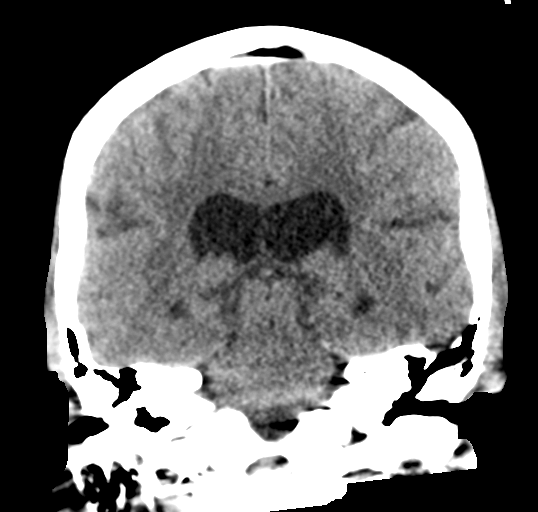
[im 37/67  brain]
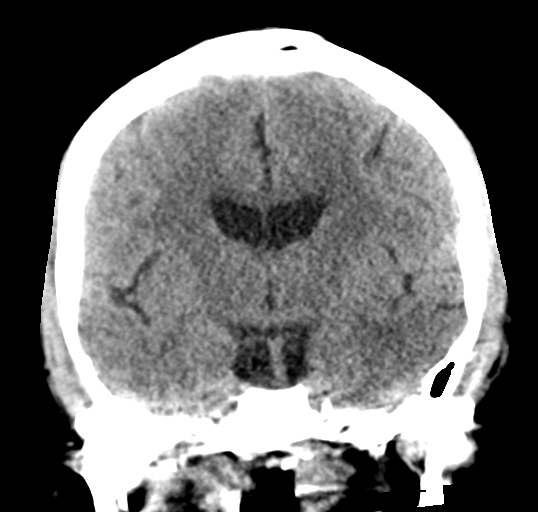

[Series 8: head without sag · sagittal · non-contrast · 0.31mm/px · 3 of 57 slices shown]
[im 19/57  brain]
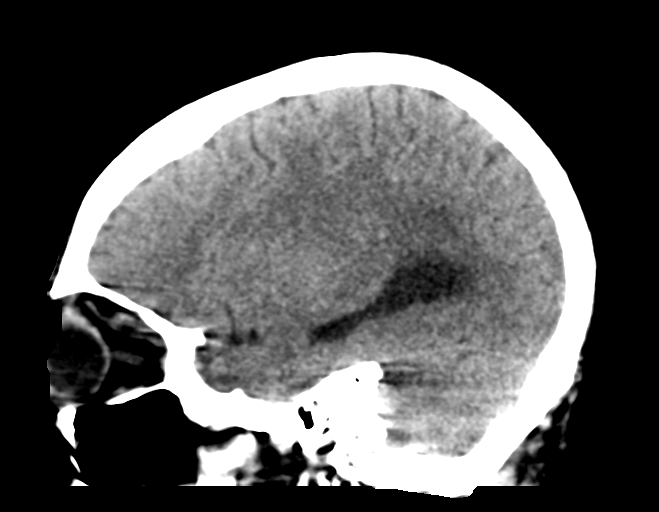
[im 29/57  brain]
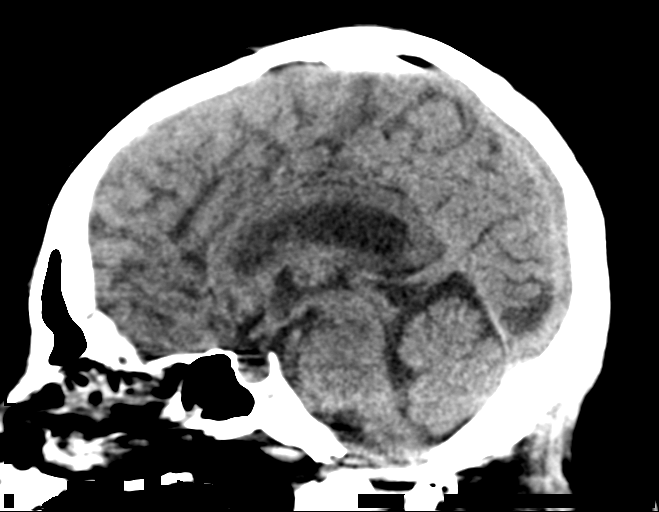
[im 38/57  brain]
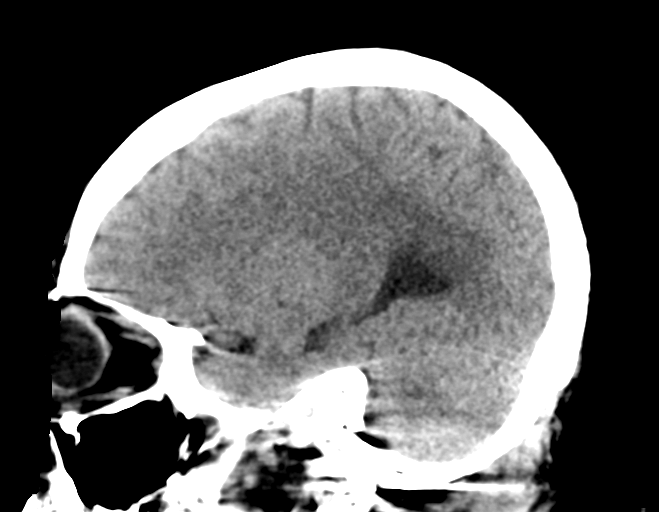

[18 of 47 positions shown; findings below may reference images not displayed]

FINDINGS: Brain: Somewhat limited by patient motion artifact. There are
changes of prior right occipital infarct. This is new from the prior
exam from [VM]. No acute infarct, acute hemorrhage or findings of
space-occupying mass lesion are noted.

Vascular: No hyperdense vessel or unexpected calcification.

Skull: Normal. Negative for fracture or focal lesion.

Sinuses/Orbits: No acute finding.

Other: None.
IMPRESSION: Old right occipital infarct.  No acute abnormality noted.

## 2019-12-16 MED ORDER — HYDROXYZINE HCL 25 MG PO TABS: 25.0000 mg | ORAL_TABLET | Freq: Four times a day (QID) | ORAL | Status: DC | PRN

## 2019-12-16 MED ORDER — LISINOPRIL 10 MG PO TABS: 10.0000 mg | ORAL_TABLET | Freq: Every day | ORAL | Status: DC

## 2019-12-16 MED ORDER — METFORMIN HCL 1000 MG PO TABS: 1000.0000 mg | ORAL_TABLET | Freq: Two times a day (BID) | ORAL | 0 refills | Status: DC

## 2019-12-16 MED ORDER — METFORMIN HCL 500 MG PO TABS: 1000.0000 mg | ORAL_TABLET | Freq: Two times a day (BID) | ORAL | Status: DC

## 2019-12-16 MED ORDER — HYDROXYZINE HCL 25 MG PO TABS
25.0000 mg | ORAL_TABLET | Freq: Four times a day (QID) | ORAL | 0 refills | Status: DC | PRN
Start: 2019-12-16 — End: 2020-09-14

## 2019-12-16 MED ORDER — LISINOPRIL 10 MG PO TABS: 10.0000 mg | ORAL_TABLET | Freq: Every day | ORAL | 0 refills | Status: DC

## 2019-12-16 NOTE — ED Notes (Signed)
Pt pulled IV out 

## 2019-12-16 NOTE — ED Notes (Signed)
Gave pt 4 packs of crackers and orange juice

## 2019-12-16 NOTE — ED Provider Notes (Signed)
[facility] EMERGENCY DEPARTMENT Provider Note   CSN: [id number] Arrival date & time: 12/16/19  4010  LEVEL 5 CAVEAT - ALTERED MENTAL STATUS  History Chief Complaint  Patient presents with  . Altered Mental Status    Kaitlyn Gray is a 44 y.o. female.  HPI 44 year old female presents with seizure/altered mental status.  History is by the friend at the bedside.  He last saw her last night around 9 PM and she was fine.  He is are coming in from the rain this morning around 7 and when she was in the room she started to have her face twist to the right and then was shaking.  He states it lasted quite a while but could not give me a time.  EMS gave 5 mg IM midazolam for shaking-like activity.  When transferring from the EMS stretcher to the ED stretcher, the nurse notes that the patient had a brief episode of shaking that was more like nonrhythmic flailing of her extremities.  She has been mumbling/singing ever since. No reported history of seizures. Friend does report she has psychiatric disease.   Past Medical History:  Diagnosis Date  . Bipolar 1 disorder ([place])   . 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 ([place]) 07/19/2017  . Hypertension     Patient Active Problem List   Diagnosis Date Noted  . Acute encephalopathy 05/12/2018  . Diabetes mellitus without complication ([place]) 27/25/3664  . Hypokalemia 12/06/2017  . Hypertension 12/06/2017  . 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 ([place]) 07/19/2017  . Bipolar disorder ([place]) 07/19/2017  . Polysubstance abuse ([place]) 07/19/2017  . Chest pain 07/19/2017  . Cocaine use disorder, severe, dependence ([place]) 03/24/2015  . Cannabis use disorder, moderate, dependence ([place]) 03/24/2015  . 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 CESAREAN SECTION       OB History    No obstetric history on file.     Family History  Problem Relation Age of Onset  . Hypertension Mother   . CAD Mother 44       died of MI at age 44  . Hypertension Father     Social History   Tobacco Use  . Smoking status: Current Every Day Smoker    Packs/day: 0.50    Types: Cigarettes  . Smokeless tobacco: Never Used  Vaping Use  . Vaping Use: Never used  Substance Use Topics  . Alcohol use: No  . Drug use: Yes    Types: Marijuana, Cocaine    Home Medications Prior to Admission medications   Medication Sig Start Date End Date Taking? Authorizing Provider  blood glucose meter kit and supplies KIT Dispense based on patient and insurance preference. Use up to four times daily as directed. (FOR ICD-9 250.00, 250.01). Please give 3 months supply of lancets and test strips to allow her to check QID. 12/07/17   Roxan Hockey, MD  blood glucose meter kit and supplies Relion Prime or Dispense other brand based on patient and insurance preference. Use up to four times daily as directed. (FOR ICD-9 250.00, 250.01). 12/07/17   Roxan Hockey, MD  hydrOXYzine (ATARAX/VISTARIL) 25 MG tablet Take 1 tablet (25 mg total) by mouth every 6 (six) hours as needed. 12/16/19   Sherwood Gambler, MD  Insulin Glargine (LANTUS SOLOSTAR) 100 UNIT/ML Solostar Pen Inject 45 Units into the skin every morning.  Patient not taking: Reported on 12/16/2019 12/07/17   Roxan Hockey, MD  insulin lispro (HUMALOG KWIKPEN) 100 UNIT/ML KiwkPen Inject 0-0.2 mLs (0-20 Units total) into the skin 4 (four) times daily -  before meals and at bedtime. Inject 0-20 Units into the skin 3 (three) times daily with meals. CBG 70 - 120: 0 units  CBG 121 - 150: 3 units  CBG 151 - 200: 4 units  CBG 201 - 250: 7 units  CBG 251 - 300: 11 units  CBG 301 - 350: 15 units  CBG 351 - 400: 20 units Patient not taking: Reported on 12/16/2019 12/07/17   Roxan Hockey, MD  Insulin Pen Needle 31G X 5 MM MISC Use as directed 12/07/17    Roxan Hockey, MD  lisinopril (ZESTRIL) 10 MG tablet Take 1 tablet (10 mg total) by mouth daily. 12/16/19 01/15/20  Sherwood Gambler, MD  metFORMIN (GLUCOPHAGE) 1000 MG tablet Take 1 tablet (1,000 mg total) by mouth 2 (two) times daily with a meal. 12/16/19   Sherwood Gambler, MD    Allergies    Hydrocodone and Latex  Review of Systems   Review of Systems  Unable to perform ROS: Mental status change    Physical Exam Updated Vital Signs BP (!) 158/94   Pulse 66   Temp 97.9 F (36.6 C) (Oral)   Resp (!) 22   Ht '5\' 3"'  (1.6 m)   Wt 92.1 kg   SpO2 98%   BMI 35.97 kg/m   Physical Exam Vitals and nursing note reviewed.  Constitutional:      Appearance: She is well-developed. She is obese.  HENT:     Head: Normocephalic and atraumatic.     Right Ear: External ear normal.     Left Ear: External ear normal.     Nose: Nose normal.  Eyes:     General:        Right eye: No discharge.        Left eye: No discharge.     Comments: Hard to get a good pupil exam. At one point they seem dilated, but whenever I open her closed eyelids she immediately moves her eyes downward and I cannot get a good pupillary exam  Cardiovascular:     Rate and Rhythm: Normal rate and regular rhythm.     Heart sounds: Normal heart sounds.  Pulmonary:     Effort: Pulmonary effort is normal.     Breath sounds: Normal breath sounds.  Abdominal:     Palpations: Abdomen is soft.     Tenderness: There is no abdominal tenderness.  Skin:    General: Skin is warm and dry.  Neurological:     Mental Status: She is alert.     Comments: Patient does not open her eyes or follow commands. She is continuously speaking/singing in a low voice, no comprehensible words.   Psychiatric:        Mood and Affect: Mood is not anxious.     ED Results / Procedures / Treatments   Labs (all labs ordered are listed, but only abnormal results are displayed) Labs Reviewed  COMPREHENSIVE METABOLIC PANEL - Abnormal; Notable for the  following components:      Result Value   CO2 20 (*)    Glucose, Bld 144 (*)    Creatinine, Ser 1.02 (*)    All other components within normal limits  ACETAMINOPHEN LEVEL - Abnormal; Notable for the following components:   Acetaminophen (Tylenol), Serum <10 (*)  All other components within normal limits  SALICYLATE LEVEL - Abnormal; Notable for the following components:   Salicylate Lvl <6.3 (*)    All other components within normal limits  CBC WITH DIFFERENTIAL/PLATELET - Abnormal; Notable for the following components:   Hemoglobin 9.4 (*)    HCT 31.7 (*)    MCV 79.3 (*)    MCH 23.5 (*)    MCHC 29.7 (*)    RDW 18.1 (*)    All other components within normal limits  CBG MONITORING, ED - Abnormal; Notable for the following components:   Glucose-Capillary 149 (*)    All other components within normal limits  SARS CORONAVIRUS 2 BY RT PCR (HOSPITAL ORDER, [facility])  ETHANOL  MAGNESIUM  URINALYSIS, ROUTINE W REFLEX MICROSCOPIC  RAPID URINE DRUG SCREEN, HOSP PERFORMED  BRAIN NATRIURETIC PEPTIDE  I-STAT BETA HCG BLOOD, ED ([facility], [facility], [facility] ONLY)    EKG EKG Interpretation  Date/Time:  Friday December 16 2019 07:35:55 EDT Ventricular Rate:  74 PR Interval:    QRS Duration: 108 QT Interval:  431 QTC Calculation: 479 R Axis:   21 Text Interpretation: Sinus rhythm RSR' in V1 or V2, probably normal variant Probable left ventricular hypertrophy similar to November 28 2019 Confirmed by Sherwood Gambler [phone]) on 12/16/2019 8:57:19 AM   Radiology CT Head Wo Contrast  Result Date: 12/16/2019 CLINICAL DATA:  Recent seizure activity EXAM: CT HEAD WITHOUT CONTRAST TECHNIQUE: Contiguous axial images were obtained from the base of the skull through the vertex without intravenous contrast. COMPARISON:  05/12/2018 FINDINGS: Brain: Somewhat limited by patient motion artifact. There are changes of prior right occipital infarct. This is new from the prior exam from 2019. No acute  infarct, acute hemorrhage or findings of space-occupying mass lesion are noted. Vascular: No hyperdense vessel or unexpected calcification. Skull: Normal. Negative for fracture or focal lesion. Sinuses/Orbits: No acute finding. Other: None. IMPRESSION: Old right occipital infarct.  No acute abnormality noted. Electronically Signed   By: Inez Catalina M.D.   On: 12/16/2019 08:45   DG Chest Portable 1 View  Result Date: 12/16/2019 CLINICAL DATA:  Altered mental status. EXAM: PORTABLE CHEST 1 VIEW COMPARISON:  11/28/2019 chest radiograph. FINDINGS: Hypoinflated lungs. Cardiomegaly, unchanged. Central pulmonary vascular congestion. New perihilar and bibasilar patchy opacities. No pneumothorax or pleural effusion. Osseous structures are unchanged. IMPRESSION: New central pulmonary vascular congestion. Minimal perihilar and bibasilar opacities, atelectasis versus minimal pulmonary edema. Cardiomegaly, unchanged. Electronically Signed   By: Primitivo Gauze M.D.   On: 12/16/2019 08:09    Procedures Procedures (including critical care time)  Medications Ordered in ED Medications  lisinopril (ZESTRIL) tablet 10 mg (has no administration in time range)  metFORMIN (GLUCOPHAGE) tablet 1,000 mg (has no administration in time range)  hydrOXYzine (ATARAX/VISTARIL) tablet 25 mg (has no administration in time range)    ED Course  I have reviewed the triage vital signs and the nursing notes.  Pertinent labs & imaging results that were available during my care of the patient were reviewed by me and considered in my medical decision making (see chart for details).    MDM Rules/Calculators/A&P                          Patient's labs have been reviewed and are fairly unremarkable.  Chest x-ray personally reviewed and I think a lot of this is poor inspiratory effort.  She is not hypoxic, does not have increased work of  breathing, or peripheral edema.  Will send BNP.  She seems to be acting out a little bit more  and when I was in the room seem to be muttering/singing even louder.  I do not think this is seizure or CNS pathology but rather psychosis.  We will consult TTS.  2:48 PM Update: Patient is awake, alert and eating food. She seems to be at baseline per boyfriend. He endorses she needs her abilify shot. After discussion with her, she is willing to stay for TTS evaluation.  The patient has been placed in psychiatric observation due to the need to provide a safe environment for the patient while obtaining psychiatric consultation and evaluation, as well as ongoing medical and medication management to treat the patient's condition.  The patient has not been placed under full IVC at this time.   While awaiting TTS, patient indicates she wants to leave.  Originally she was willing to stay but does not want to be admitted to behavioral health.  She is not suicidal or homicidal.  Boyfriend indicates she is back to mental baseline.  I have no reason to emergently keep her here against her will, though I did discuss that it would be best in her medical interest to be admitted or at least see TTS/psychiatry.  She seems understand this but does not want to stay.  Will discharge and refill her lisinopril, metformin, and hydroxyzine. Final Clinical Impression(s) / ED Diagnoses Final diagnoses:  Transient alteration of awareness    Rx / DC Orders ED Discharge Orders         Ordered    hydrOXYzine (ATARAX/VISTARIL) 25 MG tablet  Every 6 hours PRN     Discontinue  Reprint     12/16/19 1430    lisinopril (ZESTRIL) 10 MG tablet  Daily     Discontinue  Reprint     12/16/19 1430    metFORMIN (GLUCOPHAGE) 1000 MG tablet  2 times daily with meals     Discontinue  Reprint     12/16/19 1430           Sherwood Gambler, MD 12/16/19 1449

## 2019-12-16 NOTE — BH Assessment (Signed)
TTS will see pt after medical clearance.

## 2019-12-16 NOTE — ED Notes (Signed)
Pt given breakfast tray

## 2019-12-16 NOTE — ED Triage Notes (Signed)
Pt bib [facility] from a friends house, pt was lying in a shed, friend called EMS for seizure like activity. No hx of seizures. Pt given 5mg  midazolam IM en route. BP 220/140.  Pt arrives with eyes closed, mumbling incomprehensible words. When transferred to stretcher pt starting jerking in the bed. Lasting about 10 seconds

## 2019-12-19 ENCOUNTER — Emergency Department (HOSPITAL_COMMUNITY)
Admission: EM | Admit: 2019-12-19 | Discharge: 2019-12-19 | Disposition: A | Discharge status: Home / Self Care | Payer: Medicaid Other | Attending: Emergency Medicine | Admitting: Emergency Medicine

## 2019-12-19 ENCOUNTER — Encounter (HOSPITAL_COMMUNITY): Payer: Self-pay | Admitting: Emergency Medicine

## 2019-12-19 DIAGNOSIS — I10 Essential (primary) hypertension: Secondary | ICD-10-CM | POA: Diagnosis not present

## 2019-12-19 DIAGNOSIS — E669 Obesity, unspecified: Secondary | ICD-10-CM | POA: Insufficient documentation

## 2019-12-19 DIAGNOSIS — Z6829 Body mass index (BMI) 29.0-29.9, adult: Secondary | ICD-10-CM | POA: Insufficient documentation

## 2019-12-19 DIAGNOSIS — Z794 Long term (current) use of insulin: Secondary | ICD-10-CM | POA: Diagnosis not present

## 2019-12-19 DIAGNOSIS — D649 Anemia, unspecified: Secondary | ICD-10-CM | POA: Diagnosis not present

## 2019-12-19 DIAGNOSIS — E119 Type 2 diabetes mellitus without complications: Secondary | ICD-10-CM | POA: Diagnosis not present

## 2019-12-19 DIAGNOSIS — Z79899 Other long term (current) drug therapy: Secondary | ICD-10-CM | POA: Insufficient documentation

## 2019-12-19 DIAGNOSIS — F1910 Other psychoactive substance abuse, uncomplicated: Secondary | ICD-10-CM | POA: Insufficient documentation

## 2019-12-19 DIAGNOSIS — R531 Weakness: Secondary | ICD-10-CM

## 2019-12-19 DIAGNOSIS — F319 Bipolar disorder, unspecified: Secondary | ICD-10-CM | POA: Diagnosis not present

## 2019-12-19 LAB — URINALYSIS, ROUTINE W REFLEX MICROSCOPIC
Bacteria, UA: NONE SEEN
Bilirubin Urine: NEGATIVE
Glucose, UA: 50 mg/dL — AB
Ketones, ur: NEGATIVE mg/dL
Leukocytes,Ua: NEGATIVE
Nitrite: NEGATIVE
Protein, ur: NEGATIVE mg/dL
Specific Gravity, Urine: 1.01 (ref 1.005–1.030)
pH: 8 (ref 5.0–8.0)

## 2019-12-19 LAB — COMPREHENSIVE METABOLIC PANEL
ALT: 11 U/L (ref 0–44)
AST: 13 U/L — ABNORMAL LOW (ref 15–41)
Albumin: 3.3 g/dL — ABNORMAL LOW (ref 3.5–5.0)
Alkaline Phosphatase: 70 U/L (ref 38–126)
Anion gap: 9 (ref 5–15)
BUN: 12 mg/dL (ref 6–20)
CO2: 27 mmol/L (ref 22–32)
Calcium: 8.8 mg/dL — ABNORMAL LOW (ref 8.9–10.3)
Chloride: 104 mmol/L (ref 98–111)
Creatinine, Ser: 1.11 mg/dL — ABNORMAL HIGH (ref 0.44–1.00)
GFR calc Af Amer: >60 mL/min (ref >60)
GFR calc non Af Amer: 60 mL/min — ABNORMAL LOW (ref >60)
Glucose, Bld: 111 mg/dL — ABNORMAL HIGH (ref 70–99)
Potassium: 3.2 mmol/L — ABNORMAL LOW (ref 3.5–5.1)
Sodium: 140 mmol/L (ref 135–145)
Total Bilirubin: 0.4 mg/dL (ref 0.3–1.2)
Total Protein: 6.7 g/dL (ref 6.5–8.1)

## 2019-12-19 LAB — CBC
HCT: 30.2 % — ABNORMAL LOW (ref 36.0–46.0)
Hemoglobin: 8.9 g/dL — ABNORMAL LOW (ref 12.0–15.0)
MCH: 23.1 pg — ABNORMAL LOW (ref 26.0–34.0)
MCHC: 29.5 g/dL — ABNORMAL LOW (ref 30.0–36.0)
MCV: 78.2 fL — ABNORMAL LOW (ref 80.0–100.0)
Platelets: 363 10*3/uL (ref 150–400)
RBC: 3.86 MIL/uL — ABNORMAL LOW (ref 3.87–5.11)
RDW: 18.6 % — ABNORMAL HIGH (ref 11.5–15.5)
WBC: 6.8 10*3/uL (ref 4.0–10.5)
nRBC: 0 % (ref 0.0–0.2)

## 2019-12-19 LAB — RAPID URINE DRUG SCREEN, HOSP PERFORMED
Amphetamines: NOT DETECTED
Barbiturates: NOT DETECTED
Benzodiazepines: NOT DETECTED
Cocaine: POSITIVE — AB
Opiates: NOT DETECTED
Tetrahydrocannabinol: POSITIVE — AB

## 2019-12-19 LAB — CBG MONITORING, ED: Glucose-Capillary: 112 mg/dL — ABNORMAL HIGH (ref 70–99)

## 2019-12-19 MED ORDER — SODIUM CHLORIDE 0.9 % IV BOLUS
1000.0000 mL | Freq: Once | INTRAVENOUS | Status: AC
  Administered 2019-12-19: 1000 mL via INTRAVENOUS

## 2019-12-19 MED ORDER — FERROUS SULFATE 325 (65 FE) MG PO TABS
325.0000 mg | ORAL_TABLET | Freq: Every day | ORAL | 0 refills | Status: DC
Start: 2019-12-19 — End: 2020-01-20

## 2019-12-19 MED ORDER — POTASSIUM CHLORIDE CRYS ER 20 MEQ PO TBCR
20.0000 meq | EXTENDED_RELEASE_TABLET | Freq: Once | ORAL | Status: AC
  Administered 2019-12-19: 20 meq via ORAL
  Filled 2019-12-19: qty 1

## 2019-12-19 NOTE — ED Notes (Signed)
Patient verbalizes understanding of discharge instructions. Opportunity for questioning and answers were provided. Armband removed by staff, pt discharged from ED.  Pt refused discharge vitals. 

## 2019-12-19 NOTE — ED Notes (Signed)
Pt refused last set of vitals

## 2019-12-19 NOTE — ED Notes (Signed)
This RN assisted pt while ambulating in room. Pt was able to ambulate ~15 feet with assistance. Pt reported feeling lightheaded and dizzy. Assisted pt into chair and checked blood pressure. BP 185/100. Will continue to monitor.

## 2019-12-19 NOTE — Social Work (Signed)
CSW connected Pt with [employer] services for a ride home via [place].

## 2019-12-19 NOTE — ED Triage Notes (Addendum)
Pt arrives with [facility]; pt was staying with a friend and was picked up on a street corner by EMS c/o not being able to walk, blurry vision, and feeling lightheaded. Pt was hypertensive 176/97; currently not taking medication "because she ran out." Pt reports her symptoms started last night.   EMS vitals:  BP 176/97 CBG 126 Temp 97.2

## 2019-12-19 NOTE — Discharge Instructions (Addendum)
Please do not use any substances especially cocaine. Please drink plenty of fluids You need to have your blood count rechecked in 1 to 2 weeks.  A prescription for iron has been called in for you. Please take your prescription blood pressure medicines and have your blood pressure rechecked by your primary care doctor

## 2019-12-19 NOTE — ED Notes (Signed)
Social worker aware patient would like to speak with them

## 2019-12-19 NOTE — ED Provider Notes (Signed)
[facility] EMERGENCY DEPARTMENT Provider Note   CSN: [id number] Arrival date & time: 12/19/19  4098     History Chief Complaint  Patient presents with  . Weakness    Kaitlyn Gray is a 44 y.o. female.  HPI    44 year old female presents via EMS with chief complaint of weakness.  History obtained from nurse via EMS with some history obtained from patient.  EMS reports the patient was picked up on the sidewalk with generalized weakness.  Patient states that she was at a cookout last night, fourth July, and spent the night.  Today she feels very weak.  Is unclear from speaking to her how she got to the sidewalk.  She states that she called EMS herself.  The weakness is generalized.  She denies any trauma.  She denies any similar symptoms in the past.  Review of recent records reveal that she was here 4 days ago with seizure and altered mental status.  During that evaluation, labs were reported as unremarkable and chest x-Gibran Veselka with poor inspiratory effort.  She was not hypoxic.  During evaluation, did not think that she had had a seizure.  TTS was consulted.  Patient became more awake and alert and her boyfriend saying that she needed her Abilify shot.  She decided to leave while waiting TTS.  I felt that she was a candidate for IVC.  She was discharged with refill of her lisinopril, Metformin, and hydroxyzine. Past Medical History:  Diagnosis Date  . Bipolar 1 disorder ([place])   . 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 Hypertension     Patient Active Problem List   Diagnosis Date Noted  . Acute encephalopathy 05/12/2018  . Diabetes mellitus without complication ([place]) 11/91/4782  . Hypokalemia 12/06/2017  . Hypertension 12/06/2017  . 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 Bipolar disorder ([place]) 07/19/2017  . Polysubstance abuse ([place]) 07/19/2017  . Chest pain  07/19/2017  . Cocaine use disorder, severe, dependence ([place]) 03/24/2015  . Cannabis use disorder, moderate, dependence ([place]) 03/24/2015  . 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 CESAREAN SECTION       OB History   No obstetric history on file.     Family History  Problem Relation Age of Onset  . Hypertension Mother   . CAD Mother 52       died of MI at age 39  . Hypertension Father     Social History   Tobacco Use  . Smoking status: Current Every Day Smoker    Packs/day: 0.50    Types: Cigarettes  . Smokeless tobacco: Never Used  Vaping Use  . Vaping Use: Never used  Substance Use Topics  . Alcohol use: No  . Drug use: Yes    Types: Marijuana, Cocaine    Home Medications Prior to Admission medications   Medication Sig Start Date End Date Taking? Authorizing Provider  blood glucose meter kit and supplies KIT Dispense based on patient and insurance preference. Use up to four times daily as directed. (FOR ICD-9 250.00, 250.01). Please give 3 months supply of lancets and test strips to allow her to check QID. 12/07/17   Roxan Hockey, MD  blood glucose meter kit and supplies Relion Prime or Dispense other brand based on patient and insurance preference. Use up to four  times daily as directed. (FOR ICD-9 250.00, 250.01). 12/07/17   Roxan Hockey, MD  hydrOXYzine (ATARAX/VISTARIL) 25 MG tablet Take 1 tablet (25 mg total) by mouth every 6 (six) hours as needed. 12/16/19   Sherwood Gambler, MD  Insulin Glargine (LANTUS SOLOSTAR) 100 UNIT/ML Solostar Pen Inject 45 Units into the skin every morning. Patient not taking: Reported on 12/16/2019 12/07/17   Roxan Hockey, MD  insulin lispro (HUMALOG KWIKPEN) 100 UNIT/ML KiwkPen Inject 0-0.2 mLs (0-20 Units total) into the skin 4 (four) times daily -  before meals and at bedtime. Inject 0-20 Units into the skin 3 (three) times daily  with meals. CBG 70 - 120: 0 units  CBG 121 - 150: 3 units  CBG 151 - 200: 4 units  CBG 201 - 250: 7 units  CBG 251 - 300: 11 units  CBG 301 - 350: 15 units  CBG 351 - 400: 20 units Patient not taking: Reported on 12/16/2019 12/07/17   Roxan Hockey, MD  Insulin Pen Needle 31G X 5 MM MISC Use as directed 12/07/17   Roxan Hockey, MD  lisinopril (ZESTRIL) 10 MG tablet Take 1 tablet (10 mg total) by mouth daily. 12/16/19 01/15/20  Sherwood Gambler, MD  metFORMIN (GLUCOPHAGE) 1000 MG tablet Take 1 tablet (1,000 mg total) by mouth 2 (two) times daily with a meal. 12/16/19   Sherwood Gambler, MD    Allergies    Hydrocodone and Latex  Review of Systems   Review of Systems  Unable to perform ROS: Mental status change    Physical Exam Updated Vital Signs BP (!) 104/91 (BP Location: Right Arm)   Pulse 70   Temp 98 F (36.7 C) (Oral)   Resp (!) 22   Ht 1.753 m ('5\' 9"' )   SpO2 98%   BMI 29.98 kg/m   Physical Exam Vitals reviewed.  Constitutional:      General: She is not in acute distress.    Appearance: She is obese. She is not ill-appearing.  HENT:     Head: Normocephalic.     Right Ear: External ear normal.     Left Ear: External ear normal.     Mouth/Throat:     Mouth: Mucous membranes are moist.     Pharynx: Oropharynx is clear.  Eyes:     Extraocular Movements: Extraocular movements intact.     Pupils: Pupils are equal, round, and reactive to light.  Cardiovascular:     Rate and Rhythm: Normal rate and regular rhythm.     Pulses: Normal pulses.     Heart sounds: Normal heart sounds.  Pulmonary:     Effort: Pulmonary effort is normal.     Breath sounds: Normal breath sounds.  Abdominal:     General: Abdomen is flat. Bowel sounds are normal.     Palpations: Abdomen is soft.  Musculoskeletal:        General: Normal range of motion.     Cervical back: Normal range of motion.  Skin:    General: Skin is warm.  Neurological:     General: No focal deficit present.      Mental Status: She is alert.     Comments: Patient moves her legs and arms side to side.,  However upon request to extend her leg at the knee or hold her arms up, she states that she cannot.     ED Results / Procedures / Treatments   Labs (all labs ordered are listed, but only abnormal results are displayed) Labs  Reviewed  RAPID URINE DRUG SCREEN, HOSP PERFORMED  URINALYSIS, ROUTINE W REFLEX MICROSCOPIC  CBC  COMPREHENSIVE METABOLIC PANEL    EKG EKG Interpretation  Date/Time:  Monday December 19 2019 07:59:53 EDT Ventricular Rate:  67 PR Interval:    QRS Duration: 100 QT Interval:  462 QTC Calculation: 488 R Axis:   43 Text Interpretation: Sinus rhythm Probable left ventricular hypertrophy Borderline prolonged QT interval Confirmed by Pattricia Boss [phone]) on 12/19/2019 8:02:50 AM   Radiology No results found.  Procedures Procedures (including critical care time)  Medications Ordered in ED Medications  sodium chloride 0.9 % bolus 1,000 mL (has no administration in time range)    ED Course  I have reviewed the triage vital signs and the nursing notes.  Pertinent labs & imaging results that were available during my care of the patient were reviewed by me and considered in my medical decision making (see chart for details).  Clinical Course as of Dec 19 1238  Mon Dec 19, 2019  1238 Cocaine and THC noted in urinePotassium low at 3.2 and given   [DR]    Clinical Course User Index [DR] Pattricia Boss, MD   MDM Rules/Calculators/A&P                         44 year old female with known history of polysubstance abuse presents today after being at a cookout all night and feeling very weak.  She was found on a street corner.  She states that she called EMS due to generalized weakness.  On my initial exam patient was given poor effort with neurological exam seen diffusely weak everywhere.  Since that time, she has been up in bed and asking for food.  She has been ambulated.  Labs  done reveal mild hypokalemia and patient given 1 dose of potassium here in department.  She has also had anemia with hemoglobin trending down now 8.9.  She reports no active bleeding.  Plan iron and follow-up.  She is hypertensive does not take her medications today.  Urine is positive for cocaine and THC. Final Clinical Impression(s) / ED Diagnoses Final diagnoses:  Weakness  Polysubstance abuse (HCC)  Anemia, unspecified type    Rx / DC Orders ED Discharge Orders    None       Pattricia Boss, MD 12/19/19 1242

## 2019-12-22 ENCOUNTER — Emergency Department (HOSPITAL_COMMUNITY)
Admission: EM | Admit: 2019-12-22 | Discharge: 2019-12-23 | Disposition: A | Discharge status: Home / Self Care | Payer: Medicaid Other | Attending: Emergency Medicine | Admitting: Emergency Medicine

## 2019-12-22 DIAGNOSIS — H538 Other visual disturbances: Secondary | ICD-10-CM | POA: Diagnosis not present

## 2019-12-22 DIAGNOSIS — Z5321 Procedure and treatment not carried out due to patient leaving prior to being seen by health care provider: Secondary | ICD-10-CM | POA: Diagnosis not present

## 2019-12-23 ENCOUNTER — Encounter (HOSPITAL_COMMUNITY): Payer: Self-pay | Admitting: Emergency Medicine

## 2019-12-23 ENCOUNTER — Other Ambulatory Visit: Payer: Self-pay

## 2019-12-23 NOTE — ED Notes (Signed)
lwbs

## 2019-12-23 NOTE — ED Triage Notes (Signed)
Patient arrived with EMS from a gas station reports blurred vision for several days and unable to walk for several days , ambulatory per EMS , refused blood tests at triage .

## 2020-01-01 ENCOUNTER — Other Ambulatory Visit: Payer: Self-pay

## 2020-01-01 ENCOUNTER — Encounter (HOSPITAL_COMMUNITY): Payer: Self-pay | Admitting: Emergency Medicine

## 2020-01-01 ENCOUNTER — Inpatient Hospital Stay (HOSPITAL_COMMUNITY): Payer: Medicaid Other

## 2020-01-01 ENCOUNTER — Inpatient Hospital Stay (HOSPITAL_COMMUNITY)
Admission: EM | Admit: 2020-01-01 | Discharge: 2020-01-03 | DRG: 064 | Discharge status: Against Medical Advice | Payer: Medicaid Other | Attending: Family Medicine | Admitting: Family Medicine

## 2020-01-01 ENCOUNTER — Emergency Department (HOSPITAL_COMMUNITY): Payer: Medicaid Other

## 2020-01-01 DIAGNOSIS — G9340 Encephalopathy, unspecified: Secondary | ICD-10-CM

## 2020-01-01 DIAGNOSIS — Z885 Allergy status to narcotic agent status: Secondary | ICD-10-CM

## 2020-01-01 DIAGNOSIS — Z5329 Procedure and treatment not carried out because of patient's decision for other reasons: Secondary | ICD-10-CM | POA: Diagnosis not present

## 2020-01-01 DIAGNOSIS — F1220 Cannabis dependence, uncomplicated: Secondary | ICD-10-CM | POA: Diagnosis present

## 2020-01-01 DIAGNOSIS — I639 Cerebral infarction, unspecified: Secondary | ICD-10-CM | POA: Diagnosis present

## 2020-01-01 DIAGNOSIS — Z20822 Contact with and (suspected) exposure to covid-19: Secondary | ICD-10-CM | POA: Diagnosis present

## 2020-01-01 DIAGNOSIS — F1420 Cocaine dependence, uncomplicated: Secondary | ICD-10-CM | POA: Diagnosis present

## 2020-01-01 DIAGNOSIS — F17210 Nicotine dependence, cigarettes, uncomplicated: Secondary | ICD-10-CM | POA: Diagnosis present

## 2020-01-01 DIAGNOSIS — F332 Major depressive disorder, recurrent severe without psychotic features: Secondary | ICD-10-CM | POA: Diagnosis present

## 2020-01-01 DIAGNOSIS — I10 Essential (primary) hypertension: Secondary | ICD-10-CM | POA: Diagnosis present

## 2020-01-01 DIAGNOSIS — Z9114 Patient's other noncompliance with medication regimen: Secondary | ICD-10-CM

## 2020-01-01 DIAGNOSIS — D509 Iron deficiency anemia, unspecified: Secondary | ICD-10-CM | POA: Diagnosis present

## 2020-01-01 DIAGNOSIS — R29700 NIHSS score 0: Secondary | ICD-10-CM | POA: Diagnosis present

## 2020-01-01 DIAGNOSIS — R29810 Facial weakness: Secondary | ICD-10-CM | POA: Diagnosis present

## 2020-01-01 DIAGNOSIS — R0600 Dyspnea, unspecified: Secondary | ICD-10-CM | POA: Diagnosis present

## 2020-01-01 DIAGNOSIS — F250 Schizoaffective disorder, bipolar type: Secondary | ICD-10-CM | POA: Diagnosis present

## 2020-01-01 DIAGNOSIS — G9341 Metabolic encephalopathy: Secondary | ICD-10-CM | POA: Diagnosis present

## 2020-01-01 DIAGNOSIS — G8191 Hemiplegia, unspecified affecting right dominant side: Secondary | ICD-10-CM | POA: Diagnosis present

## 2020-01-01 DIAGNOSIS — Z79899 Other long term (current) drug therapy: Secondary | ICD-10-CM | POA: Diagnosis not present

## 2020-01-01 DIAGNOSIS — I351 Nonrheumatic aortic (valve) insufficiency: Secondary | ICD-10-CM | POA: Diagnosis not present

## 2020-01-01 DIAGNOSIS — E119 Type 2 diabetes mellitus without complications: Secondary | ICD-10-CM | POA: Diagnosis not present

## 2020-01-01 DIAGNOSIS — F319 Bipolar disorder, unspecified: Secondary | ICD-10-CM | POA: Diagnosis not present

## 2020-01-01 DIAGNOSIS — E876 Hypokalemia: Secondary | ICD-10-CM | POA: Diagnosis present

## 2020-01-01 DIAGNOSIS — Z8673 Personal history of transient ischemic attack (TIA), and cerebral infarction without residual deficits: Secondary | ICD-10-CM | POA: Diagnosis not present

## 2020-01-01 DIAGNOSIS — G379 Demyelinating disease of central nervous system, unspecified: Secondary | ICD-10-CM | POA: Diagnosis not present

## 2020-01-01 DIAGNOSIS — F32A Depression, unspecified: Secondary | ICD-10-CM | POA: Diagnosis present

## 2020-01-01 DIAGNOSIS — R471 Dysarthria and anarthria: Secondary | ICD-10-CM | POA: Diagnosis present

## 2020-01-01 DIAGNOSIS — Z6833 Body mass index (BMI) 33.0-33.9, adult: Secondary | ICD-10-CM | POA: Diagnosis not present

## 2020-01-01 DIAGNOSIS — R260 Ataxic gait: Secondary | ICD-10-CM | POA: Diagnosis present

## 2020-01-01 DIAGNOSIS — E1165 Type 2 diabetes mellitus with hyperglycemia: Secondary | ICD-10-CM | POA: Diagnosis present

## 2020-01-01 DIAGNOSIS — Z8249 Family history of ischemic heart disease and other diseases of the circulatory system: Secondary | ICD-10-CM

## 2020-01-01 DIAGNOSIS — Z794 Long term (current) use of insulin: Secondary | ICD-10-CM

## 2020-01-01 DIAGNOSIS — D638 Anemia in other chronic diseases classified elsewhere: Secondary | ICD-10-CM | POA: Diagnosis present

## 2020-01-01 DIAGNOSIS — I152 Hypertension secondary to endocrine disorders: Secondary | ICD-10-CM | POA: Diagnosis present

## 2020-01-01 DIAGNOSIS — Z91040 Latex allergy status: Secondary | ICD-10-CM

## 2020-01-01 HISTORY — DX: Cerebral infarction, unspecified: I63.9

## 2020-01-01 LAB — RAPID URINE DRUG SCREEN, HOSP PERFORMED
Amphetamines: NOT DETECTED
Barbiturates: NOT DETECTED
Benzodiazepines: NOT DETECTED
Cocaine: POSITIVE — AB
Opiates: NOT DETECTED
Tetrahydrocannabinol: NOT DETECTED

## 2020-01-01 LAB — CBC WITH DIFFERENTIAL/PLATELET
Abs Immature Granulocytes: 0.02 10*3/uL (ref 0.00–0.07)
Basophils Absolute: 0 10*3/uL (ref 0.0–0.1)
Basophils Relative: 1 %
Eosinophils Absolute: 0.3 10*3/uL (ref 0.0–0.5)
Eosinophils Relative: 5 %
HCT: 29.3 % — ABNORMAL LOW (ref 36.0–46.0)
Hemoglobin: 8.5 g/dL — ABNORMAL LOW (ref 12.0–15.0)
Immature Granulocytes: 0 %
Lymphocytes Relative: 31 %
Lymphs Abs: 1.9 10*3/uL (ref 0.7–4.0)
MCH: 23.2 pg — ABNORMAL LOW (ref 26.0–34.0)
MCHC: 29 g/dL — ABNORMAL LOW (ref 30.0–36.0)
MCV: 79.8 fL — ABNORMAL LOW (ref 80.0–100.0)
Monocytes Absolute: 0.3 10*3/uL (ref 0.1–1.0)
Monocytes Relative: 5 %
Neutro Abs: 3.5 10*3/uL (ref 1.7–7.7)
Neutrophils Relative %: 58 %
Platelets: 280 10*3/uL (ref 150–400)
RBC: 3.67 MIL/uL — ABNORMAL LOW (ref 3.87–5.11)
RDW: 17.6 % — ABNORMAL HIGH (ref 11.5–15.5)
WBC: 6.1 10*3/uL (ref 4.0–10.5)
nRBC: 0 % (ref 0.0–0.2)

## 2020-01-01 LAB — URINALYSIS, ROUTINE W REFLEX MICROSCOPIC
Bilirubin Urine: NEGATIVE
Glucose, UA: 150 mg/dL — AB
Hgb urine dipstick: NEGATIVE
Ketones, ur: NEGATIVE mg/dL
Leukocytes,Ua: NEGATIVE
Nitrite: NEGATIVE
Protein, ur: NEGATIVE mg/dL
Specific Gravity, Urine: 1.016 (ref 1.005–1.030)
pH: 8 (ref 5.0–8.0)

## 2020-01-01 LAB — COMPREHENSIVE METABOLIC PANEL
ALT: 13 U/L (ref 0–44)
AST: 15 U/L (ref 15–41)
Albumin: 3.2 g/dL — ABNORMAL LOW (ref 3.5–5.0)
Alkaline Phosphatase: 63 U/L (ref 38–126)
Anion gap: 10 (ref 5–15)
BUN: 13 mg/dL (ref 6–20)
CO2: 27 mmol/L (ref 22–32)
Calcium: 8.9 mg/dL (ref 8.9–10.3)
Chloride: 106 mmol/L (ref 98–111)
Creatinine, Ser: 1.03 mg/dL — ABNORMAL HIGH (ref 0.44–1.00)
GFR calc Af Amer: >60 mL/min (ref >60)
GFR calc non Af Amer: >60 mL/min (ref >60)
Glucose, Bld: 181 mg/dL — ABNORMAL HIGH (ref 70–99)
Potassium: 3.2 mmol/L — ABNORMAL LOW (ref 3.5–5.1)
Sodium: 143 mmol/L (ref 135–145)
Total Bilirubin: 0.5 mg/dL (ref 0.3–1.2)
Total Protein: 6.4 g/dL — ABNORMAL LOW (ref 6.5–8.1)

## 2020-01-01 LAB — SARS CORONAVIRUS 2 BY RT PCR (HOSPITAL ORDER, PERFORMED IN ~~LOC~~ HOSPITAL LAB): SARS Coronavirus 2: NEGATIVE

## 2020-01-01 LAB — ETHANOL: Alcohol, Ethyl (B): <10 mg/dL (ref <10)

## 2020-01-01 LAB — CBG MONITORING, ED: Glucose-Capillary: 241 mg/dL — ABNORMAL HIGH (ref 70–99)

## 2020-01-01 IMAGING — MR MR HEAD W/O CM
9 of 11 series · 33 of 48 positions shown · non-contrast
Comparison: None.

CLINICAL DATA: Possible stroke

EXAM:
MRI HEAD WITHOUT CONTRAST
TECHNIQUE: Multiplanar, multiecho pulse sequences of the brain and surrounding
structures were obtained without intravenous contrast.

[Series 3: DWI · axial · 3.0mm · 0.94mm/px · z∈[-90,+49]mm · 8 of 104 slices shown (1 of 2)]
[im 1/104]
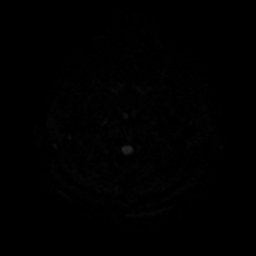
[im 15/104]
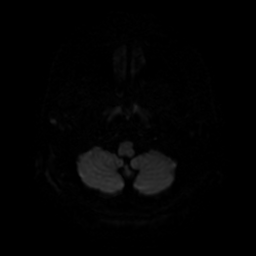
[im 30/104]
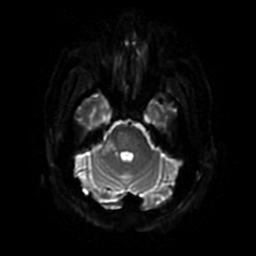
[im 45/104]
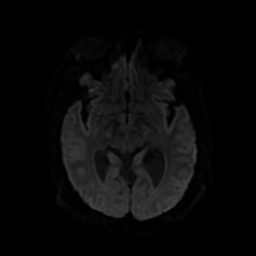
[im 59/104]
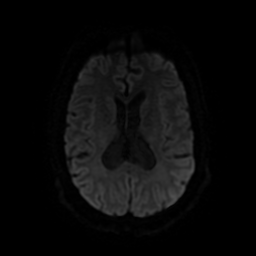
[im 74/104]
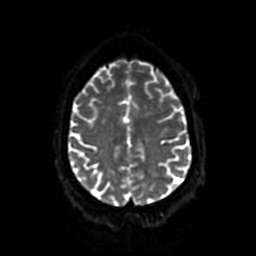
[im 89/104]
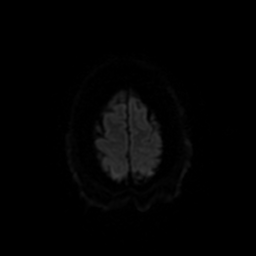
[im 104/104]
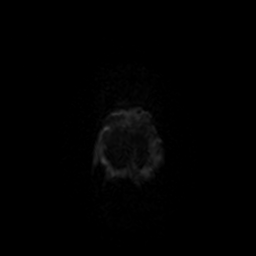

[Series 4: DWI · coronal · 4.0mm · 0.94mm/px · 5 of 72 slices shown (2 of 2)]
[im 1/72]
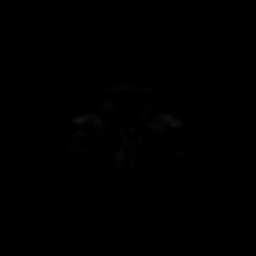
[im 18/72]
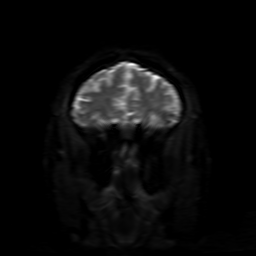
[im 36/72]
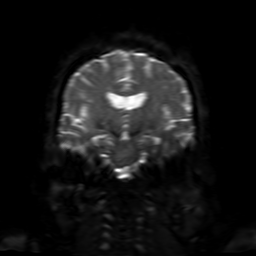
[im 54/72]
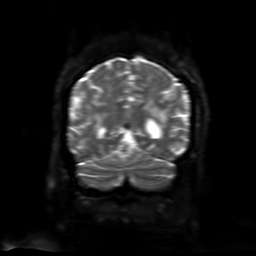
[im 72/72]
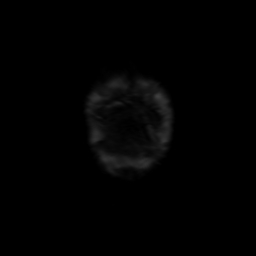

[Series 5: FLAIR · sagittal · 5.0mm · 0.47mm/px · 2 of 25 slices shown (1 of 2)]
[im 1/25]
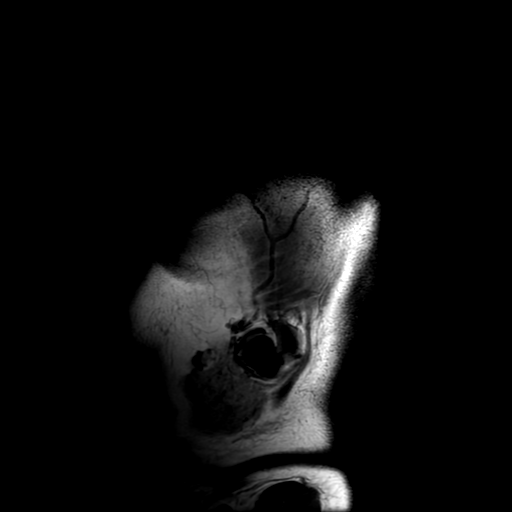
[im 25/25]
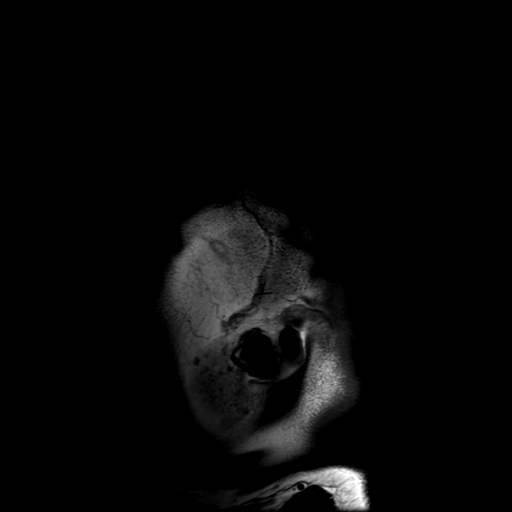

[Series 6: FLAIR · axial · 3.0mm · 0.43mm/px · z∈[-83,+54]mm · 2 of 26 slices shown (2 of 2)]
[im 1/26]
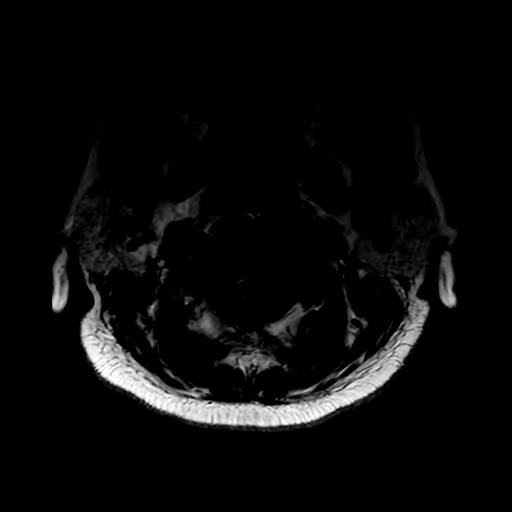
[im 26/26]
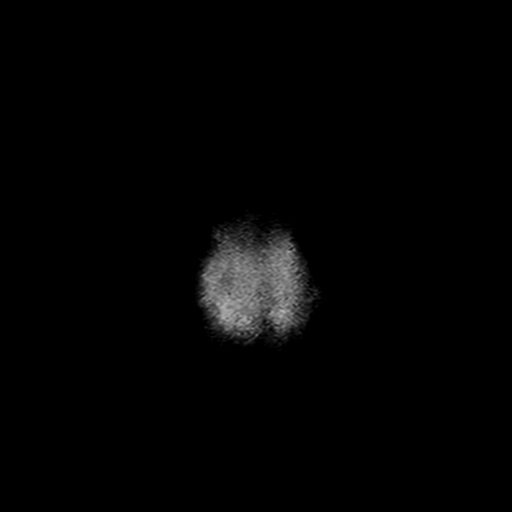

[Series 7: (person_name) · axial · 3.0mm · 0.47mm/px · z∈[-93,-14]mm · 5 of 104 slices shown]
[im 1/104]
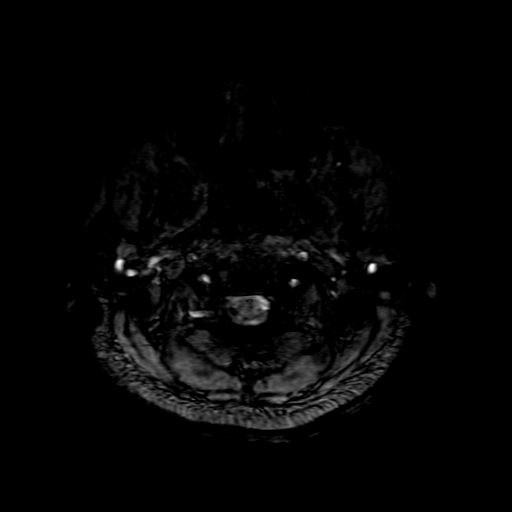
[im 15/104]
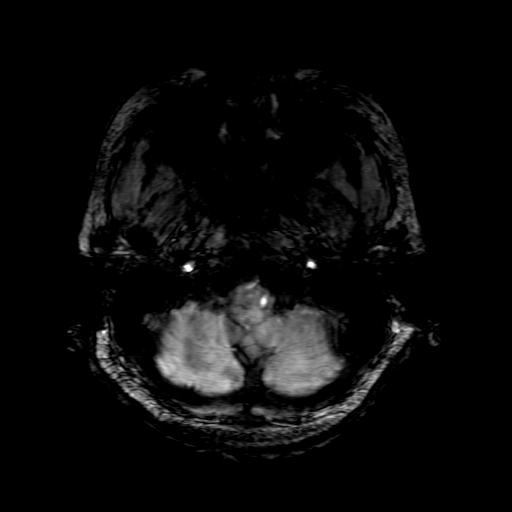
[im 30/104]
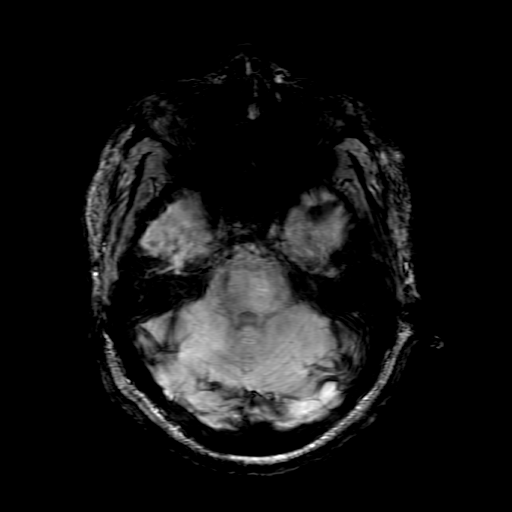
[im 45/104]
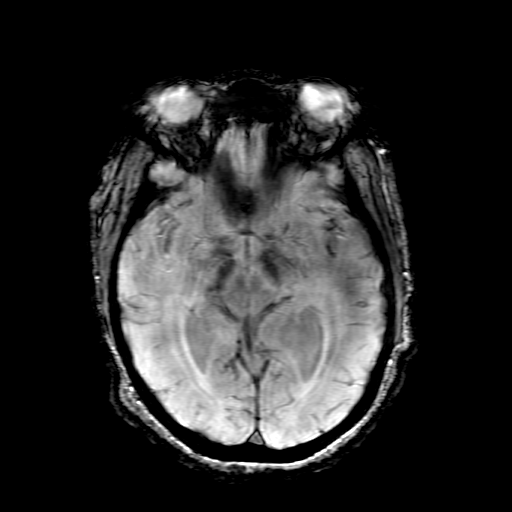
[im 59/104]
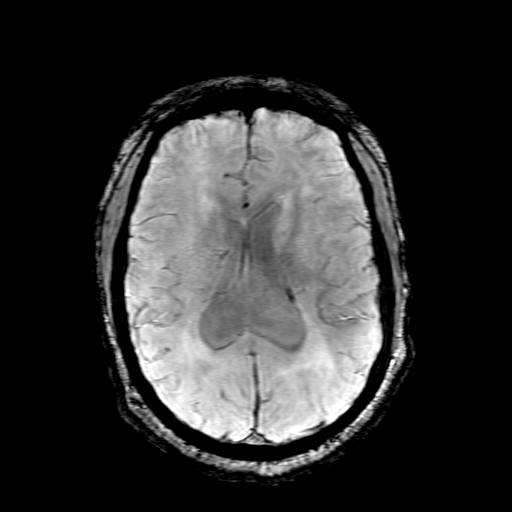

[Series 8: T2 · axial · 5.0mm · 0.47mm/px · z∈[-94,+43]mm · 2 of 26 slices shown]
[im 1/26]
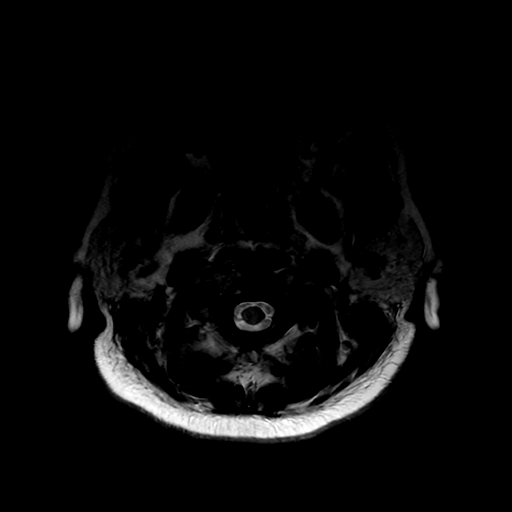
[im 26/26]
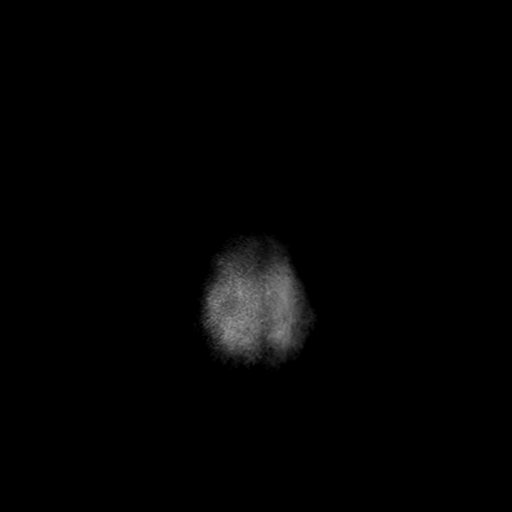

[Series 10: T2 post-contrast · coronal · 5.0mm · 0.39mm/px · 2 of 30 slices shown]
[im 1/30]
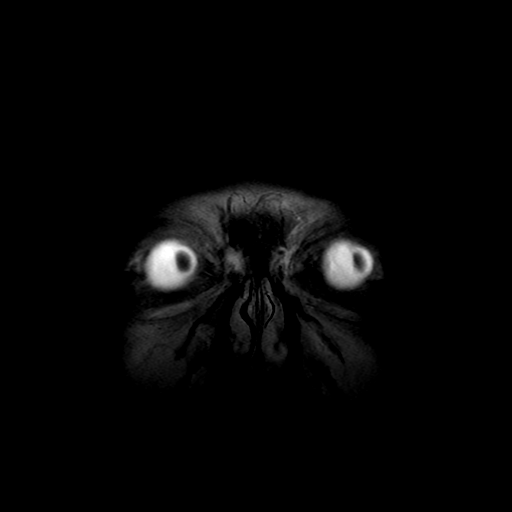
[im 30/30]
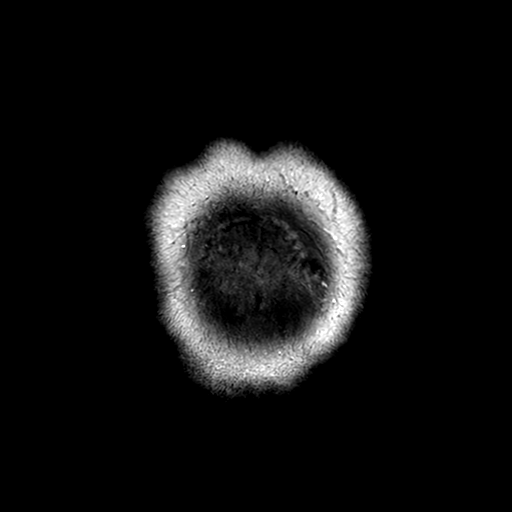

[Series 350: ADC · axial · 3.0mm · 0.94mm/px · z∈[-90,+49]mm · 4 of 52 slices shown (1 of 2)]
[im 1/52]
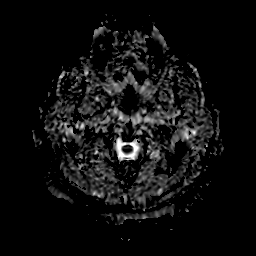
[im 18/52]
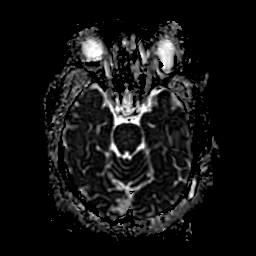
[im 35/52]
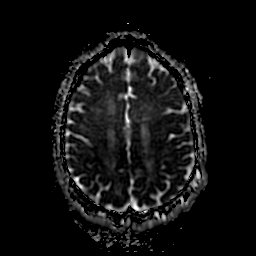
[im 52/52]
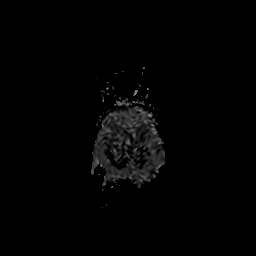

[Series 450: ADC · coronal · 4.0mm · 0.94mm/px · 3 of 36 slices shown (2 of 2)]
[im 1/36]
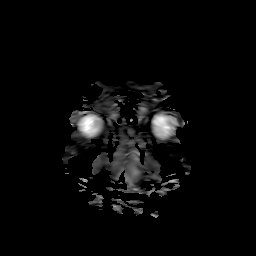
[im 18/36]
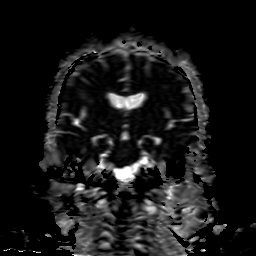
[im 36/36]
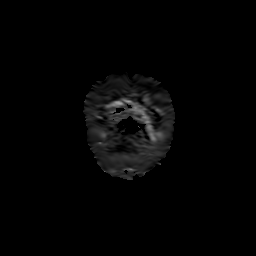

[33 of 48 positions shown; findings below may reference images not displayed]

FINDINGS: Brain: There is mildly reduced diffusion along the right middle
cerebellar peduncle and adjacent pons including the trigeminal root
entry zone.

Foci of susceptibility are present in the thalamus bilaterally
compatible with chronic microhemorrhages or less likely
mineralization. Lateral ventricles are mildly prominent likely on an
ex vacuo basis. There is patchy and confluent T2 hyperintensity in
the supratentorial and pontine white matter, which is nonspecific
but may reflect moderate chronic microvascular ischemic changes,
greater than expected for age.

There is no intracranial mass or significant mass effect. There is
no hydrocephalus or extra-axial fluid collection.

Vascular: Major vessel flow voids at the skull base are preserved.

Skull and upper cervical spine: Normal marrow signal is preserved.

Sinuses/Orbits: Minor mucosal thickening. There is chronic deformity
of the right medial orbital wall, which may be posttraumatic.

Other: Sella is unremarkable.  Mastoid air cells are clear.
IMPRESSION: Acute to subacute infarction involving the right middle cerebellar
peduncle and adjacent pons including the trigeminal root entry zone
(AICA distribution). A demyelinating lesion commonly involves this
region but is considered less likely given appearance of white
matter disease.

Age advanced chronic microvascular ischemic changes.

## 2020-01-01 IMAGING — CR DG CHEST 2V
2 series · 2 of 2 positions shown · non-contrast
Comparison: Radiograph [DATE], CT [DATE]

CLINICAL DATA: Altered mental status, weakness and facial droop,
possible CVA

EXAM:
CHEST - 2 VIEW

[chest lat]
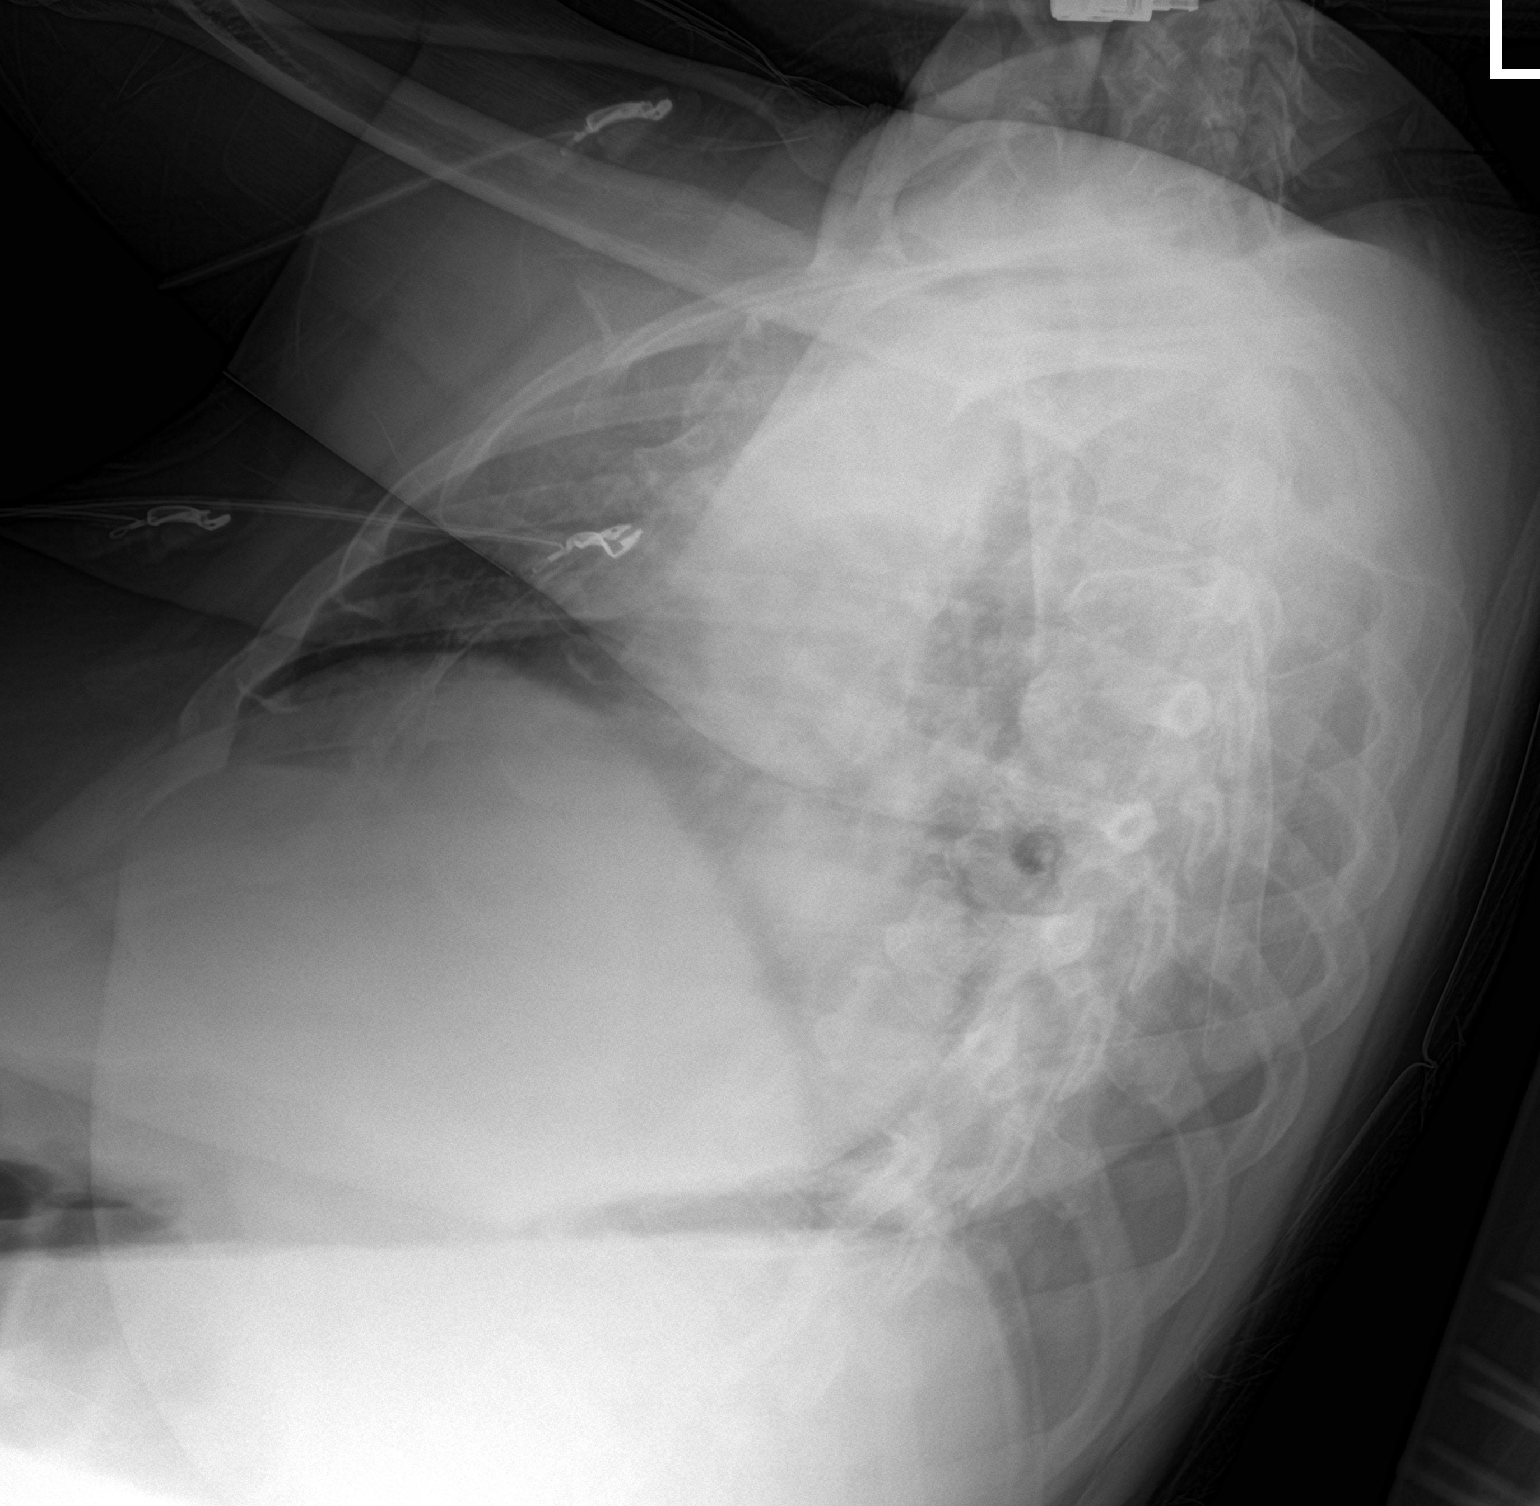

[chest ap]
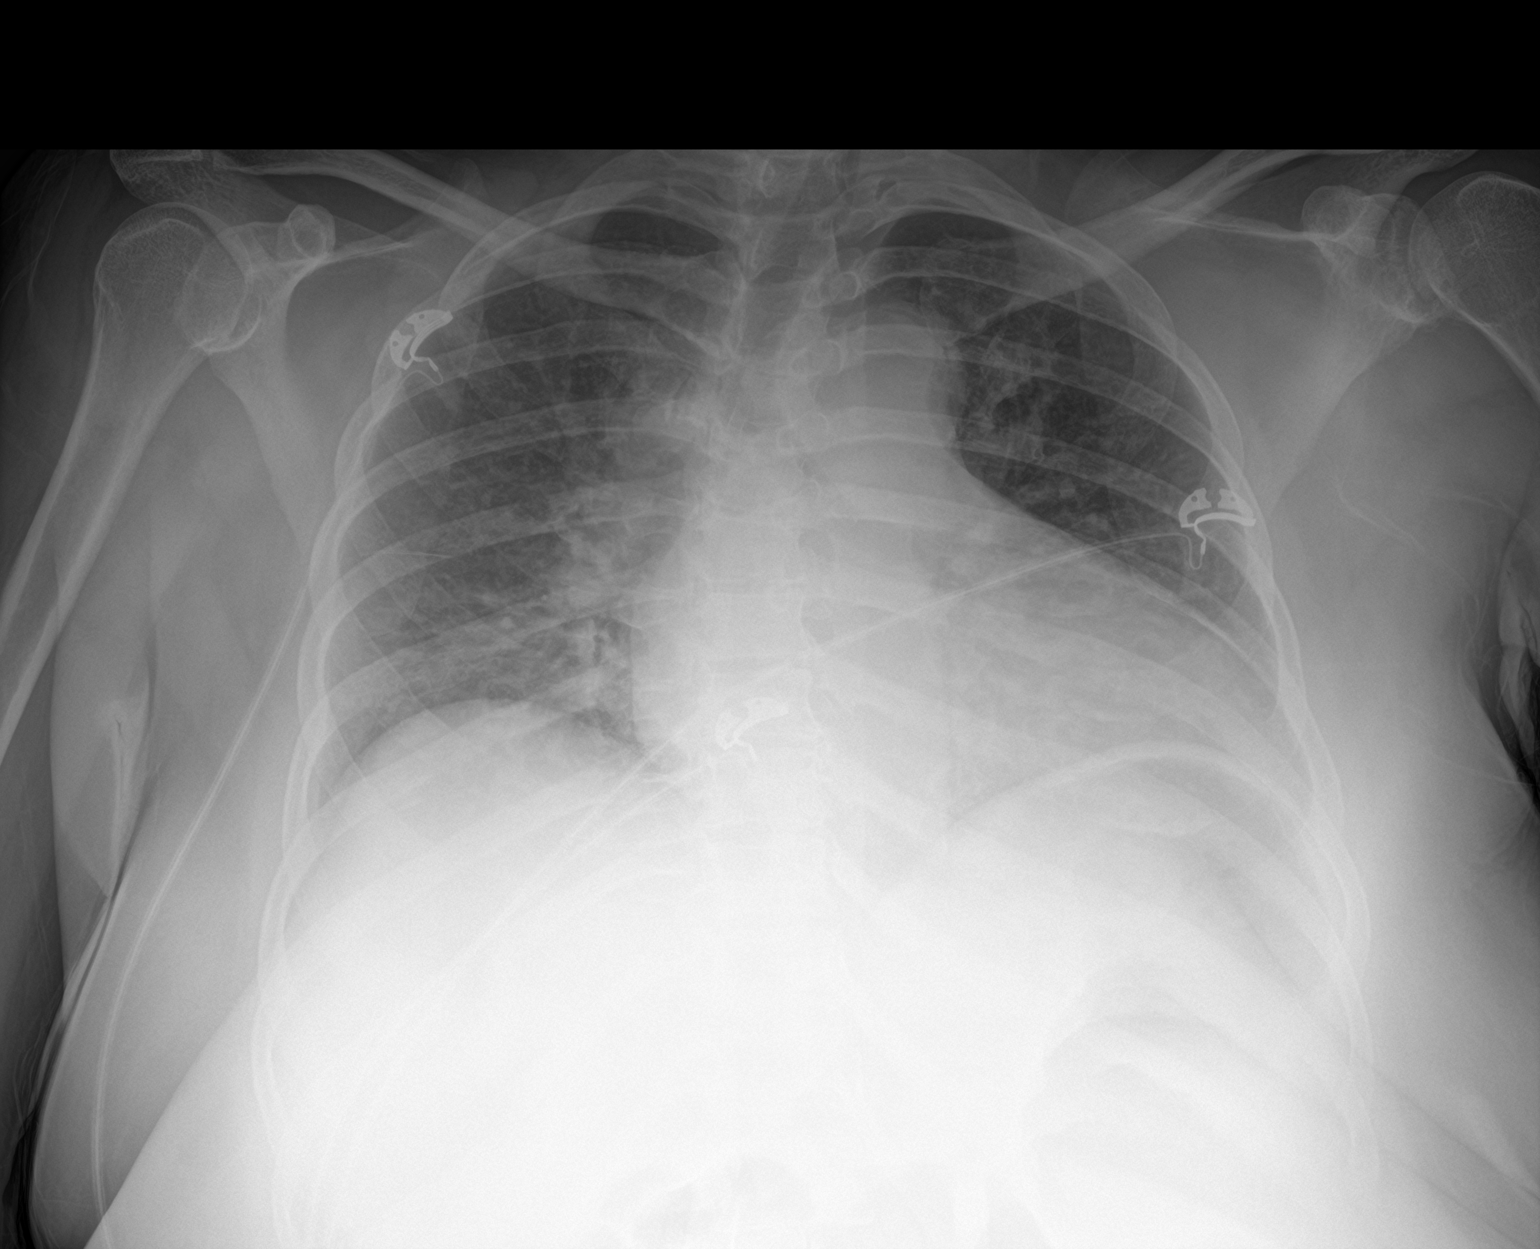

[2 of 2 positions shown; findings below may reference images not displayed]

FINDINGS: Low lung volumes with ill-defined opacities in the lung bases
possibly reflecting atelectasis though consolidation or sequela of
aspiration could have a similar appearance in the setting of altered
mental status given some mild basilar airways thickening as well.
Mild cardiomegaly is similar to prior counting for differences in
technique. No pneumothorax or visible effusion. No acute osseous or
soft tissue abnormality. Telemetry leads overlie the chest.
IMPRESSION: Low lung volumes with ill-defined opacities in the lung bases, favor
atelectasis though consolidation or aspiration could present
similarly.

## 2020-01-01 MED ORDER — SENNOSIDES-DOCUSATE SODIUM 8.6-50 MG PO TABS
1.0000 | ORAL_TABLET | Freq: Every evening | ORAL | Status: DC | PRN
  Filled 2020-01-01: qty 1

## 2020-01-01 MED ORDER — STROKE: EARLY STAGES OF RECOVERY BOOK
Freq: Once | Status: DC
  Filled 2020-01-01: qty 1

## 2020-01-01 MED ORDER — ENOXAPARIN SODIUM 40 MG/0.4ML ~~LOC~~ SOLN: 40.0000 mg | SUBCUTANEOUS | Status: DC

## 2020-01-01 MED ORDER — ACETAMINOPHEN 160 MG/5ML PO SOLN: 650.0000 mg | ORAL | Status: DC | PRN

## 2020-01-01 MED ORDER — ACETAMINOPHEN 650 MG RE SUPP: 650.0000 mg | RECTAL | Status: DC | PRN

## 2020-01-01 MED ORDER — ASPIRIN 300 MG RE SUPP: 300.0000 mg | Freq: Every day | RECTAL | Status: DC

## 2020-01-01 MED ORDER — INSULIN ASPART 100 UNIT/ML ~~LOC~~ SOLN
0.0000 [IU] | Freq: Every day | SUBCUTANEOUS | Status: DC
  Administered 2020-01-01: 2 [IU] via SUBCUTANEOUS

## 2020-01-01 MED ORDER — ASPIRIN 325 MG PO TABS
325.0000 mg | ORAL_TABLET | Freq: Every day | ORAL | Status: DC
  Administered 2020-01-01 – 2020-01-03 (×3): 325 mg via ORAL
  Filled 2020-01-01 (×3): qty 1

## 2020-01-01 MED ORDER — ACETAMINOPHEN 325 MG PO TABS
650.0000 mg | ORAL_TABLET | ORAL | Status: DC | PRN
  Administered 2020-01-03: 650 mg via ORAL
  Filled 2020-01-01: qty 2

## 2020-01-01 MED ORDER — INSULIN ASPART 100 UNIT/ML ~~LOC~~ SOLN
0.0000 [IU] | Freq: Three times a day (TID) | SUBCUTANEOUS | Status: DC
  Administered 2020-01-02 (×2): 2 [IU] via SUBCUTANEOUS
  Administered 2020-01-02 – 2020-01-03 (×2): 3 [IU] via SUBCUTANEOUS
  Administered 2020-01-03 (×2): 2 [IU] via SUBCUTANEOUS

## 2020-01-01 NOTE — ED Notes (Signed)
Transported to MRI

## 2020-01-01 NOTE — ED Notes (Signed)
Tele Dinner Ordered

## 2020-01-01 NOTE — ED Triage Notes (Addendum)
Pt arrived to triage wanting to wait outside.  States she doesn't want to have to wait in lobby.  Reports headache, "face twisted", R arm weakness, and unsteady gait x 1 week.  States her boyfriend has been trying to get her to come to the hospital.  Reports that she came today because she is now having R ear pain.  Pt has alteration of arm drift when trying to assess. When asked her to hold arms up she says she doesn't want to and initially R arm fell straight down and then she raised R arm without a drift and L arm fell down.  Notified EDP of pt to see if stroke work up needs to be completed due to inconsistency with neuro exam.

## 2020-01-01 NOTE — ED Notes (Signed)
Pt on her phone when PA entered room.  Difficulty getting pt to follow commands and participate in neuro assessment. Pt alert.  Pt to lobby.  Started screaming that she was going outside.

## 2020-01-01 NOTE — ED Provider Notes (Signed)
MSE was initiated and I personally evaluated the patient and placed orders (if any) at  8:52 AM on January 01, 2020.  I saw the patient in triage given concern for possible stroke.  On my examination, she does have an asymmetric smile, however it seems to be intentional.  She was on her phone completing a disability/unemployment application when I first entered the room and she initially refused to stop filling out her application so that needs to be performed.  She also has multiple complaints, most notably her right ear discomfort.  However she states that she feels as though she is "talking funny", but her voice and impairment seems to be fluctuating.  She was able to move all her extremities on my examination, but is endorsing an unsteady gait x1 week.  Given her duration of symptoms, do not feel a stroke code stroke is warranted.  I also have higher suspicion that her collection of stroke-like symptoms are psychogenic in etiology.  She has a history of bipolar 1 disorder and was seen earlier this month and diagnosed with transient alteration of awareness.  Cocaine and THC noted in her labs obtained 12/19/2019 where she was also demonstrating similar symptoms of weakness.  Do not feel as though emergent imaging is warranted.  The patient appears stable so that the remainder of the MSE may be completed by another provider.   Lorelee New, PA-C 01/01/20 [id number]    Eber Hong, MD 01/01/20 [phone]

## 2020-01-01 NOTE — ED Notes (Signed)
PT walked back in. Ask her where has she been as we have called her for room assignment.

## 2020-01-01 NOTE — ED Notes (Signed)
Pt transported to XR.  

## 2020-01-01 NOTE — Consult Note (Signed)
NEURO HOSPITALIST CONSULT NOTE   Requestig physician: Dr. Starla Link  Reason for Consult: Dizziness, unsteady gait and weakness in right arm and leg.   History obtained from:   Patient and Chart     HPI:                                                                                                                                          Kaitlyn SPRUIELL is an 44 y.o. female with a PMHx of bipolar disorder, DM2, prior episode of hyperosmolar non-ketotic state, polysubstance abuse, medication noncompliance and HTN who presented to the ED with headache, dizziness, unsteady gait, weakness in her right arm and leg, facial droop and slurred speech, which have been ongoing and worsening for the past 3 weeks. She had been seen twice in the ED without a diagnosis. She has had no symptoms of infection, including no fevers, chills, abdominal pain, dysuria or diarrhea. Also no SOB, CP or palpitations. She denied neck pain.   In the ED, her UDS was positive for cocaine and tetrahydrocannabinol.  MRI was obtained, revealing patchy restricted diffusion with a smaller corresponding area of FLAIR hyperintensity involving the right MCP and right lateral aspect of the pons.   Neurology was consulted for possible stroke versus MS versus other inflammatory lesion.   Past Medical History:  Diagnosis Date  . Bipolar 1 disorder ([place])   . 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 Hypertension     Past Surgical History:  Procedure Laterality Date  . CESAREAN SECTION      Family History  Problem Relation Age of Onset  . Hypertension Mother   . CAD Mother 33       died of MI at age 66  . Hypertension Father              Social History:  reports that she has been smoking cigarettes. She has been smoking about 0.50 packs per day. She has never used smokeless tobacco. She reports current drug use. Drugs: Marijuana and Cocaine. She reports that  she does not drink alcohol.  Allergies  Allergen Reactions  . Hydrocodone Itching  . Latex Rash    HOME MEDICATIONS:  No current facility-administered medications on file prior to encounter.   Current Outpatient Medications on File Prior to Encounter  Medication Sig Dispense Refill  . blood glucose meter kit and supplies KIT Dispense based on patient and insurance preference. Use up to four times daily as directed. (FOR ICD-9 250.00, 250.01). Please give 3 months supply of lancets and test strips to allow her to check QID. 1 each 0  . blood glucose meter kit and supplies Relion Prime or Dispense other brand based on patient and insurance preference. Use up to four times daily as directed. (FOR ICD-9 250.00, 250.01). 1 each 3  . ferrous sulfate 325 (65 FE) MG tablet Take 1 tablet (325 mg total) by mouth daily. (Patient not taking: Reported on 01/01/2020) 30 tablet 0  . hydrOXYzine (ATARAX/VISTARIL) 25 MG tablet Take 1 tablet (25 mg total) by mouth every 6 (six) hours as needed. (Patient not taking: Reported on 01/01/2020) 30 tablet 0  . Insulin Glargine (LANTUS SOLOSTAR) 100 UNIT/ML Solostar Pen Inject 45 Units into the skin every morning. (Patient not taking: Reported on 12/16/2019) 15 mL 1  . insulin lispro (HUMALOG KWIKPEN) 100 UNIT/ML KiwkPen Inject 0-0.2 mLs (0-20 Units total) into the skin 4 (four) times daily -  before meals and at bedtime. Inject 0-20 Units into the skin 3 (three) times daily with meals. CBG 70 - 120: 0 units  CBG 121 - 150: 3 units  CBG 151 - 200: 4 units  CBG 201 - 250: 7 units  CBG 251 - 300: 11 units  CBG 301 - 350: 15 units  CBG 351 - 400: 20 units (Patient not taking: Reported on 12/16/2019) 15 mL 11  . Insulin Pen Needle 31G X 5 MM MISC Use as directed 100 each 2  . lisinopril (ZESTRIL) 10 MG tablet Take 1 tablet (10 mg total) by mouth daily.  (Patient not taking: Reported on 01/01/2020) 30 tablet 0  . metFORMIN (GLUCOPHAGE) 1000 MG tablet Take 1 tablet (1,000 mg total) by mouth 2 (two) times daily with a meal. (Patient not taking: Reported on 01/01/2020) 60 tablet 0     ROS:                                                                                                                                       The patient is a poor historian. In this context, she has no additional symptoms other than as documented in the HPI.    Blood pressure (!) 183/97, pulse 71, temperature 98.7 F (37.1 C), temperature source Oral, resp. rate 17, height '5\' 3"'  (1.6 m), weight 86.2 kg, SpO2 100 %.   General Examination:  Physical Exam  HEENT-  [place]/AT   Lungs- Respirations unlabored Extremities- No edema  Neurological Examination Mental Status: Awake with decreased level of alertness. Poor attention. Non-agitated. Speech is slow and dysarthric with scanning quality (flat intonation). In the context of the short sentences that she uses when answering questions, speech is fluent. Able to demonstrate comprehension of all questions and simple commands, but was not able to follow a 3 step directional command. Naming intact for 2/3 common items. Cranial Nerves: II: Right sided visual field cut. PERRL.  III,IV, VI: No ptosis. Esotropia noted. Decreased motility of right eye relative to left (lags left). Subtle nystagmus in mid-gaze. Has difficulty with volitional eye movements to the right of midline, both with tracking and saccades, but was able to cross the midline briefly when tracking.  V: Decreased temp sensation to right side of face.  VII: Prominent right facial droop. There is also increased contraction of her left perioral musculature and intermittent chewing motions of her jaw in conjunction with drawing up of the left side of her face. No twitching  noted.  VIII: hearing intact to voice.  IX,X: No hypophonia XI: Decreased movement on the right. Head preferentially rotated to the left.  XII: Will not protrude tongue, but with mouth open tongue is deviated to the left.  Motor: RUE with decreased tone and decreased spontaneous movement. Able to briefly raise antigravity - 3/5 proximal and distal strength RLE unable to lift antigravity at thigh or knee but with a possible component of effort dependence. Will wiggle ankle and toes, but this is of lower amplitude than on the left.  LUE 5/5 LLE 4/5 with some effort dependence.  Muscle bulk equal x 4.  Sensory: Severely diminished FT and pressure sensation to RUE. Decreased temp sensation to RUE and RLE.  Deep Tendon Reflexes:  1+ right biceps, brachioradialis and patella 2+ left biceps, brachioradialis and patella 1+ left achilles, 0 right achilles Toes mute bilaterally  Cerebellar: Mild ataxia with FNF on the left, moderate to severe ataxia with FNF on the right  Gait: Deferred due to falls risk concerns   Lab Results: Basic Metabolic Panel: Recent Labs  Lab 01/01/20 1332  NA 143  K 3.2*  CL 106  CO2 27  GLUCOSE 181*  BUN 13  CREATININE 1.03*  CALCIUM 8.9    CBC: Recent Labs  Lab 01/01/20 1332  WBC 6.1  NEUTROABS 3.5  HGB 8.5*  HCT 29.3*  MCV 79.8*  PLT 280    Cardiac Enzymes: No results for input(s): CKTOTAL, CKMB, CKMBINDEX, TROPONINI in the last 168 hours.  Lipid Panel: No results for input(s): CHOL, TRIG, HDL, CHOLHDL, VLDL, LDLCALC in the last 168 hours.  Imaging: DG Chest 2 View  Result Date: 01/01/2020 CLINICAL DATA:  Altered mental status, weakness and facial droop, possible CVA EXAM: CHEST - 2 VIEW COMPARISON:  Radiograph 12/16/2019, CT 03/24/2018 FINDINGS: Low lung volumes with ill-defined opacities in the lung bases possibly reflecting atelectasis though consolidation or sequela of aspiration could have a similar appearance in the setting of altered  mental status given some mild basilar airways thickening as well. Mild cardiomegaly is similar to prior counting for differences in technique. No pneumothorax or visible effusion. No acute osseous or soft tissue abnormality. Telemetry leads overlie the chest. IMPRESSION: Low lung volumes with ill-defined opacities in the lung bases, favor atelectasis though consolidation or aspiration could present similarly. Electronically Signed   By: Lovena Le M.D.   On: 01/01/2020 19:18  MR BRAIN WO CONTRAST  Result Date: 01/01/2020 CLINICAL DATA:  Possible stroke EXAM: MRI HEAD WITHOUT CONTRAST TECHNIQUE: Multiplanar, multiecho pulse sequences of the brain and surrounding structures were obtained without intravenous contrast. COMPARISON:  None. FINDINGS: Brain: There is mildly reduced diffusion along the right middle cerebellar peduncle and adjacent pons including the trigeminal root entry zone. Foci of susceptibility are present in the thalamus bilaterally compatible with chronic microhemorrhages or less likely mineralization. Lateral ventricles are mildly prominent likely on an ex vacuo basis. There is patchy and confluent T2 hyperintensity in the supratentorial and pontine white matter, which is nonspecific but may reflect moderate chronic microvascular ischemic changes, greater than expected for age. There is no intracranial mass or significant mass effect. There is no hydrocephalus or extra-axial fluid collection. Vascular: Major vessel flow voids at the skull base are preserved. Skull and upper cervical spine: Normal marrow signal is preserved. Sinuses/Orbits: Minor mucosal thickening. There is chronic deformity of the right medial orbital wall, which may be posttraumatic. Other: Sella is unremarkable.  Mastoid air cells are clear. 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 A demyelinating  lesion commonly involves this region but is considered less likely given appearance of white matter disease. Age advanced chronic microvascular ischemic changes. Electronically Signed   By: Macy Mis M.D.   On: 01/01/2020 16:58    Assessment: 44 year old female with a 3-week history of worsening headache, dizziness, unsteady gait, weakness in her right arm and leg, facial droop and slurred speech. 1. Exam reveals findings best localizable to the right brainstem and cerebellum, as well as the left occipital lobe.  2. 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 A demyelinating lesion commonly involves this region but is considered less likely given appearance of white matter disease. Age advanced chronic microvascular ischemic changes. 3. DDx for her clinical and imaging presentation includes stroke (cardioembolic vs in-situ thrombosis vs vasospasm secondary to RCVS vs vasculitis) and multiple sclerosis.   Recommendations: 1. Add-on MRI study with contrast has been ordered to assess for possible demyelinating lesion (ordered).  2. MRI of the cervical spine with and without contrast (ordered) 3. CTA of head and neck to assess for vasculitis or severe atherosclerosis. 4. If cervical MRI shows lesions or if post-contrast brain reveals that the right cerebellar lesion is enhancing, will need lumbar puncture for the following: Cell count with differential, glucose, protein, IgG index, oligoclonal bands, cytology.    Electronically signed: Dr. Kerney Elbe 01/01/2020, 7:29 PM

## 2020-01-01 NOTE — ED Notes (Signed)
Pt came back into lobby and demanded a blanket. Told PT you can have a blank if you stay inside. PT stated that I was being mean. Told PT I am not being mean that I am stating a fact.

## 2020-01-01 NOTE — ED Notes (Signed)
EDP notified of pt.  PA in triage for MSE.

## 2020-01-01 NOTE — ED Notes (Signed)
Pt sitting outside smoking, requested PT that she needs to smoke outside [facility] area. PT walked off throwing blood pressure cuff on floor.

## 2020-01-01 NOTE — ED Provider Notes (Signed)
[facility] EMERGENCY DEPARTMENT Provider Note   CSN: [id number] Arrival date & time: 01/01/20  6203     History Chief Complaint  Patient presents with  . Weakness  . Facial Droop    Kaitlyn Gray is a 44 y.o. female.  Patient is a 44 year old female with past medical history of bipolar disorder, diabetes, hypertension, polysubstance abuse.  She presents today for evaluation of multiple complaints.  Patient states that she has headache, dizziness, unsteady gait, weakness in her right arm and leg, facial droop, slurred speech.  These symptoms have been present for the past 3 weeks and are worsening.  Patient has been seen here on 2 prior occasions, however no definitive cause has been found.  She denies any aggravating or alleviating factors.  She denies any fevers, chills, or stiff neck.  The history is provided by the patient.       Past Medical History:  Diagnosis Date  . Bipolar 1 disorder ([place])   . 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 ([place]) 07/19/2017  . Hypertension     Patient Active Problem List   Diagnosis Date Noted  . Acute encephalopathy 05/12/2018  . Diabetes mellitus without complication ([place]) 55/97/4163  . Hypokalemia 12/06/2017  . Hypertension 12/06/2017  . 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 ([place]) 07/19/2017  . Bipolar disorder ([place]) 07/19/2017  . Polysubstance abuse ([place]) 07/19/2017  . Chest pain 07/19/2017  . Cocaine use disorder, severe, dependence ([place]) 03/24/2015  . Cannabis use disorder, moderate, dependence ([place]) 03/24/2015  . MDD (major depressive disorder), recurrent severe, without psychosis ([place]) 03/24/2015    Past Surgical History:  Procedure Laterality Date  . CESAREAN SECTION       OB History   No obstetric history on file.     Family History  Problem Relation Age of Onset  . Hypertension Mother   . CAD Mother 82       died of MI at age  24  . Hypertension Father     Social History   Tobacco Use  . Smoking status: Current Every Day Smoker    Packs/day: 0.50    Types: Cigarettes  . Smokeless tobacco: Never Used  Vaping Use  . Vaping Use: Never used  Substance Use Topics  . Alcohol use: No  . Drug use: Yes    Types: Marijuana, Cocaine    Home Medications Prior to Admission medications   Medication Sig Start Date End Date Taking? Authorizing Provider  blood glucose meter kit and supplies KIT Dispense based on patient and insurance preference. Use up to four times daily as directed. (FOR ICD-9 250.00, 250.01). Please give 3 months supply of lancets and test strips to allow her to check QID. 12/07/17   Roxan Hockey, MD  blood glucose meter kit and supplies Relion Prime or Dispense other brand based on patient and insurance preference. Use up to four times daily as directed. (FOR ICD-9 250.00, 250.01). 12/07/17   Roxan Hockey, MD  ferrous sulfate 325 (65 FE) MG tablet Take 1 tablet (325 mg total) by mouth daily. 12/19/19   Pattricia Boss, MD  hydrOXYzine (ATARAX/VISTARIL) 25 MG tablet Take 1 tablet (25 mg total) by mouth every 6 (six) hours as needed. 12/16/19   Sherwood Gambler, MD  Insulin Glargine (LANTUS SOLOSTAR) 100 UNIT/ML Solostar Pen Inject 45 Units into the skin every morning. Patient not taking: Reported on 12/16/2019 12/07/17   Roxan Hockey, MD  insulin lispro (  HUMALOG KWIKPEN) 100 UNIT/ML KiwkPen Inject 0-0.2 mLs (0-20 Units total) into the skin 4 (four) times daily -  before meals and at bedtime. Inject 0-20 Units into the skin 3 (three) times daily with meals. CBG 70 - 120: 0 units  CBG 121 - 150: 3 units  CBG 151 - 200: 4 units  CBG 201 - 250: 7 units  CBG 251 - 300: 11 units  CBG 301 - 350: 15 units  CBG 351 - 400: 20 units Patient not taking: Reported on 12/16/2019 12/07/17   Roxan Hockey, MD  Insulin Pen Needle 31G X 5 MM MISC Use as directed 12/07/17   Roxan Hockey, MD  lisinopril (ZESTRIL) 10  MG tablet Take 1 tablet (10 mg total) by mouth daily. 12/16/19 01/15/20  Sherwood Gambler, MD  metFORMIN (GLUCOPHAGE) 1000 MG tablet Take 1 tablet (1,000 mg total) by mouth 2 (two) times daily with a meal. 12/16/19   Sherwood Gambler, MD    Allergies    Hydrocodone and Latex  Review of Systems   Review of Systems  All other systems reviewed and are negative.   Physical Exam Updated Vital Signs BP (!) 160/95 (BP Location: Right Arm)   Pulse 63   Temp 98.7 F (37.1 C) (Oral)   Resp 17   Ht '5\' 3"'  (1.6 m)   Wt 86.2 kg   SpO2 100%   BMI 33.66 kg/m   Physical Exam Vitals and nursing note reviewed.  Constitutional:      General: She is not in acute distress.    Appearance: She is well-developed. She is not diaphoretic.  HENT:     Head: Normocephalic and atraumatic.  Cardiovascular:     Rate and Rhythm: Normal rate and regular rhythm.     Heart sounds: No murmur heard.  No friction rub. No gallop.   Pulmonary:     Effort: Pulmonary effort is normal. No respiratory distress.     Breath sounds: Normal breath sounds. No wheezing.  Abdominal:     General: Bowel sounds are normal. There is no distension.     Palpations: Abdomen is soft.     Tenderness: There is no abdominal tenderness.  Musculoskeletal:        General: Normal range of motion.     Cervical back: Normal range of motion and neck supple.  Skin:    General: Skin is warm and dry.  Neurological:     General: No focal deficit present.     Mental Status: She is alert and oriented to person, place, and time.     Cranial Nerves: No cranial nerve deficit.     Motor: Weakness present.     Comments: There is weakness of handgrip of the right arm and flexion and extension right leg at the hip and knee.     ED Results / Procedures / Treatments   Labs (all labs ordered are listed, but only abnormal results are displayed) Labs Reviewed  COMPREHENSIVE METABOLIC PANEL  ETHANOL  CBC WITH DIFFERENTIAL/PLATELET  RAPID URINE DRUG  SCREEN, HOSP PERFORMED  URINALYSIS, ROUTINE W REFLEX MICROSCOPIC    EKG None  Radiology No results found.  Procedures Procedures (including critical care time)  Medications Ordered in ED Medications - No data to display  ED Course  I have reviewed the triage vital signs and the nursing notes.  Pertinent labs & imaging results that were available during my care of the patient were reviewed by me and considered in my medical decision making (  see chart for details).    MDM Rules/Calculators/A&P  Patient presenting here with complaints of strokelike symptoms.  She has a history of bipolar disorder and her history is somewhat difficult to follow.  Work-up initiated including laboratory studies and I have elected to obtain an MRI of the brain as she has experienced a stroke in the past.  This showed an area of acute to subacute infarct involving the right middle cerebellar pedicle and adjacent pons.  This finding was discussed with Dr. Rory Percy from neurology.  He would like the patient to be admitted to the hospitalist service for stroke work-up.  I have spoken with the hospitalist who agrees to admit.  CRITICAL CARE Performed by: Veryl Speak Total critical care time: 35 minutes Critical care time was exclusive of separately billable procedures and treating other patients. Critical care was necessary to treat or prevent imminent or life-threatening deterioration. Critical care was time spent personally by me on the following activities: development of treatment plan with patient and/or surrogate as well as nursing, discussions with consultants, evaluation of patient's response to treatment, examination of patient, obtaining history from patient or surrogate, ordering and performing treatments and interventions, ordering and review of laboratory studies, ordering and review of radiographic studies, pulse oximetry and re-evaluation of patient's condition.   Final Clinical Impression(s) /  ED Diagnoses Final diagnoses:  None    Rx / DC Orders ED Discharge Orders    None       Veryl Speak, MD 01/01/20 Lurena Nida

## 2020-01-01 NOTE — ED Notes (Signed)
Pt states she cannot urinate at this time.

## 2020-01-01 NOTE — ED Notes (Signed)
Called PT in [place] and Outside, No Answer

## 2020-01-01 NOTE — H&P (Signed)
History and Physical    LYA HOLBEN PVV:[id number] DOB: 1976-01-31 DOA: 01/01/2020  PCP: Marliss Coots, NP   Patient coming from: Home  I have personally briefly reviewed patient's old medical records in [place]  Chief Complaint: Weakness and facial droop  HPI: BORGHILD THAKER is a 44 y.o. female with medical history significant of bipolar disorder, diabetes mellitus type 2, hypertension, polysubstance abuse, noncompliance to medications presented with multiple complaints including headache, dizziness, unsteady gait, weakness in her right arm and leg, facial droop and slurred speech.  Patient states that the symptoms have been present for the past 3 weeks and are worsening.  Patient has been seen in the ED twice, however no definitive cause was found and she was sent home.  Patient denies any fevers, chills, neck stiffness, loss of consciousness, seizures, abdominal pain, diarrhea, dysuria, shortness of breath, chest pain, palpitations.  Patient is a poor historian.  ED Course: MRI of the brain was positive for acute to subacute infarction.  Neurology was consulted by ED provider.  Urine drug screen was positive for cocaine and tetrahydrocannabinol.  Hospitalist service was called to evaluate the patient.  Review of Systems: As per HPI otherwise all other systems were reviewed and are negative.   Past Medical History:  Diagnosis Date  . Bipolar 1 disorder ([place])   . 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 ([place]) 07/19/2017  . Hypertension     Past Surgical History:  Procedure Laterality Date  . CESAREAN SECTION     Social history  reports that she has been smoking cigarettes. She has been smoking about 0.50 packs per day. She has never used smokeless tobacco. She reports current drug use. Drugs: Marijuana and Cocaine. She reports that she does not drink alcohol.  Allergies  Allergen Reactions  . Hydrocodone Itching  . Latex Rash     Family History  Problem Relation Age of Onset  . Hypertension Mother   . CAD Mother 46       died of MI at age 47  . Hypertension Father     Prior to Admission medications   Medication Sig Start Date End Date Taking? Authorizing Provider  blood glucose meter kit and supplies KIT Dispense based on patient and insurance preference. Use up to four times daily as directed. (FOR ICD-9 250.00, 250.01). Please give 3 months supply of lancets and test strips to allow her to check QID. 12/07/17   Roxan Hockey, MD  blood glucose meter kit and supplies Relion Prime or Dispense other brand based on patient and insurance preference. Use up to four times daily as directed. (FOR ICD-9 250.00, 250.01). 12/07/17   Roxan Hockey, MD  ferrous sulfate 325 (65 FE) MG tablet Take 1 tablet (325 mg total) by mouth daily. Patient not taking: Reported on 01/01/2020 12/19/19   Pattricia Boss, MD  hydrOXYzine (ATARAX/VISTARIL) 25 MG tablet Take 1 tablet (25 mg total) by mouth every 6 (six) hours as needed. Patient not taking: Reported on 01/01/2020 12/16/19   Sherwood Gambler, MD  Insulin Glargine (LANTUS SOLOSTAR) 100 UNIT/ML Solostar Pen Inject 45 Units into the skin every morning. Patient not taking: Reported on 12/16/2019 12/07/17   Roxan Hockey, MD  insulin lispro (HUMALOG KWIKPEN) 100 UNIT/ML KiwkPen Inject 0-0.2 mLs (0-20 Units total) into the skin 4 (four) times daily -  before meals and at bedtime. Inject 0-20 Units into the skin 3 (three) times daily with meals. CBG 70 - 120: 0  units  CBG 121 - 150: 3 units  CBG 151 - 200: 4 units  CBG 201 - 250: 7 units  CBG 251 - 300: 11 units  CBG 301 - 350: 15 units  CBG 351 - 400: 20 units Patient not taking: Reported on 12/16/2019 12/07/17   Roxan Hockey, MD  Insulin Pen Needle 31G X 5 MM MISC Use as directed 12/07/17   Roxan Hockey, MD  lisinopril (ZESTRIL) 10 MG tablet Take 1 tablet (10 mg total) by mouth daily. Patient not taking: Reported on 01/01/2020  12/16/19 01/15/20  Sherwood Gambler, MD  metFORMIN (GLUCOPHAGE) 1000 MG tablet Take 1 tablet (1,000 mg total) by mouth 2 (two) times daily with a meal. Patient not taking: Reported on 01/01/2020 12/16/19   Sherwood Gambler, MD    Physical Exam: Vitals:   01/01/20 0822 01/01/20 1224 01/01/20 1228  BP: (!) 167/92 (!) 160/95 (!) 160/95  Pulse: 76 66 63  Resp: _0 Temp: 98.7 F (37.1 C)    TempSrc: Oral    SpO2: 98% 100% 100%  Weight: 86.2 kg    Height: 5' 3" (1.6 m)      Constitutional: NAD, calm, comfortable.  Looks older than stated age.  Poor historian. Vitals:   01/01/20 0822 01/01/20 1224 01/01/20 1228  BP: (!) 167/92 (!) 160/95 (!) 160/95  Pulse: 76 66 63  Resp: _1 Temp: 98.7 F (37.1 C)    TempSrc: Oral    SpO2: 98% 100% 100%  Weight: 86.2 kg    Height: 5' 3" (1.6 m)     Eyes: PERRL, lids and conjunctivae normal ENMT: Mucous membranes are moist. Posterior pharynx clear of any exudate or lesions. Neck: normal, supple, no masses, no thyromegaly Respiratory: bilateral decreased breath sounds at bases, no wheezing; some scattered crackles.. Normal respiratory effort. No accessory muscle use.  Cardiovascular: S1 S2 positive, rate controlled. No extremity edema. 2+ pedal pulses.  Abdomen: no tenderness, no masses palpated. No hepatosplenomegaly. Bowel sounds positive.  Musculoskeletal: no clubbing / cyanosis. No joint deformity upper and lower extremities.  Skin: no rashes, lesions, ulcers. No induration Neurologic: Facial droop present.  Right upper and lower extremity significantly weaker than the left.  Alert and awake  psychiatric: Poor historian.  Does not participate in conversation much.  Flat affect.  Labs on Admission: I have personally reviewed following labs and imaging studies  CBC: Recent Labs  Lab 01/01/20 1332  WBC 6.1  NEUTROABS 3.5  HGB 8.5*  HCT 29.3*  MCV 79.8*  PLT 428   Basic Metabolic Panel: Recent Labs  Lab 01/01/20 1332  NA 143  K  3.2*  CL 106  CO2 27  GLUCOSE 181*  BUN 13  CREATININE 1.03*  CALCIUM 8.9   GFR: Estimated Creatinine Clearance: 71.8 mL/min (A) (by C-G formula based on SCr of 1.03 mg/dL (H)). Liver Function Tests: Recent Labs  Lab 01/01/20 1332  AST 15  ALT 13  ALKPHOS 63  BILITOT 0.5  PROT 6.4*  ALBUMIN 3.2*   No results for input(s): LIPASE, AMYLASE in the last 168 hours. No results for input(s): AMMONIA in the last 168 hours. Coagulation Profile: No results for input(s): INR, PROTIME in the last 168 hours. Cardiac Enzymes: No results for input(s): CKTOTAL, CKMB, CKMBINDEX, TROPONINI in the last 168 hours. BNP (last 3 results) No results for input(s): PROBNP in the last 8760 hours. HbA1C: No results for input(s): HGBA1C in the last 72 hours. CBG: No results for  input(s): GLUCAP in the last 168 hours. Lipid Profile: No results for input(s): CHOL, HDL, LDLCALC, TRIG, CHOLHDL, LDLDIRECT in the last 72 hours. Thyroid Function Tests: No results for input(s): TSH, T4TOTAL, FREET4, T3FREE, THYROIDAB in the last 72 hours. Anemia Panel: No results for input(s): VITAMINB12, FOLATE, FERRITIN, TIBC, IRON, RETICCTPCT in the last 72 hours. Urine analysis:    Component Value Date/Time   COLORURINE STRAW (A) 12/19/2019 1106   APPEARANCEUR CLEAR 12/19/2019 1106   LABSPEC 1.010 12/19/2019 1106   PHURINE 8.0 12/19/2019 1106   GLUCOSEU 50 (A) 12/19/2019 1106   HGBUR SMALL (A) 12/19/2019 1106   BILIRUBINUR NEGATIVE 12/19/2019 1106   KETONESUR NEGATIVE 12/19/2019 1106   PROTEINUR NEGATIVE 12/19/2019 1106   UROBILINOGEN 0.2 10/06/2014 0856   NITRITE NEGATIVE 12/19/2019 1106   LEUKOCYTESUR NEGATIVE 12/19/2019 1106    Radiological Exams on Admission: MR BRAIN WO CONTRAST  Result Date: 01/01/2020 CLINICAL DATA:  Possible stroke EXAM: MRI HEAD WITHOUT CONTRAST TECHNIQUE: Multiplanar, multiecho pulse sequences of the brain and surrounding structures were obtained without intravenous contrast.  COMPARISON:  None. FINDINGS: Brain: There is mildly reduced diffusion along the right middle cerebellar peduncle and adjacent pons including the trigeminal root entry zone. Foci of susceptibility are present in the thalamus bilaterally compatible with chronic microhemorrhages or less likely mineralization. Lateral ventricles are mildly prominent likely on an ex vacuo basis. There is patchy and confluent T2 hyperintensity in the supratentorial and pontine white matter, which is nonspecific but may reflect moderate chronic microvascular ischemic changes, greater than expected for age. There is no intracranial mass or significant mass effect. There is no hydrocephalus or extra-axial fluid collection. Vascular: Major vessel flow voids at the skull base are preserved. Skull and upper cervical spine: Normal marrow signal is preserved. Sinuses/Orbits: Minor mucosal thickening. There is chronic deformity of the right medial orbital wall, which may be posttraumatic. Other: Sella is unremarkable.  Mastoid air cells are clear. 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 A demyelinating lesion commonly involves this region but is considered less likely given appearance of white matter disease. Age advanced chronic microvascular ischemic changes. Electronically Signed   By: Macy Mis M.D.   On: 01/01/2020 16:58    EKG: Independently reviewed.  Normal sinus rhythm with no ST elevations or depressions.  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  Neurology has been consulted.  Follow recommendations. -2D echo/ultrasound carotid.  Telemetry monitoring. -PT/OT/SLP evaluation.  Bedside swallow eval: N.p.o. till then. -Allow for permissive hypertension -Aspirin -Hemoglobin A1c/lipid profile in  a.m.  Might need statin depending on lipid profile  Polysubstance abuse -Urine drug screen positive for cocaine and tetrahydrocannabinol.  Counseled patient regarding abstinence.  Social worker consult.  Anemia of chronic disease -Questionable cause.  Hemoglobin stable.  Hypokalemia -will replace intravenously.  Repeat a.m. labs  Diabetes mellitus type 2 -Patient has been noncompliant with her meds.  A1c in a.m.  CBGs with SSI  Hypertension  -Monitor blood pressure.  Allow for permissive hypertension  Noncompliance -Patient needs to be compliant with her medications and follow-up  History of bipolar disorder -Outpatient follow-up.  DVT prophylaxis: Lovenox Code Status: Full Family Communication: Spoke to patient and boyfriend at bedside Disposition Plan: Home in 1 to 2 days once clinically improved Consults called: Neurology by ED provider Admission status: Inpatient/telemetry  Severity of Illness:  The appropriate patient status for this patient is INPATIENT. Inpatient status is judged to be reasonable and necessary in order to provide the required intensity of service to ensure the patient's safety. The patient's presenting symptoms, physical exam findings, and initial radiographic and laboratory data in the context of their chronic comorbidities is felt to place them at high risk for further clinical deterioration. Furthermore, it is not anticipated that the patient will be medically stable for discharge from the hospital within 2 midnights of admission. The following factors support the patient status of inpatient.   " The patient's presenting symptoms include right-sided weakness and facial droop. " The worrisome physical exam findings include right-sided weakness/facial droop. " The initial radiographic and laboratory data are worrisome because of acute to subacute infarction. " The chronic co-morbidities include hypertension/diabetes mellitus type 2/polysubstance  abuse/noncompliance.   * I certify that at the point of admission it is my clinical judgment that the patient will require inpatient hospital care spanning beyond 2 midnights from the point of admission due to high intensity of service, high risk for further deterioration and high frequency of surveillance required.Aline August MD [facility]  01/01/2020, 5:55 PM

## 2020-01-02 ENCOUNTER — Inpatient Hospital Stay (HOSPITAL_COMMUNITY): Payer: Medicaid Other

## 2020-01-02 DIAGNOSIS — G379 Demyelinating disease of central nervous system, unspecified: Secondary | ICD-10-CM

## 2020-01-02 DIAGNOSIS — I639 Cerebral infarction, unspecified: Secondary | ICD-10-CM

## 2020-01-02 DIAGNOSIS — I351 Nonrheumatic aortic (valve) insufficiency: Secondary | ICD-10-CM

## 2020-01-02 LAB — CBC WITH DIFFERENTIAL/PLATELET
Abs Immature Granulocytes: 0.01 10*3/uL (ref 0.00–0.07)
Basophils Absolute: 0 10*3/uL (ref 0.0–0.1)
Basophils Relative: 1 %
Eosinophils Absolute: 0.3 10*3/uL (ref 0.0–0.5)
Eosinophils Relative: 6 %
HCT: 27.4 % — ABNORMAL LOW (ref 36.0–46.0)
Hemoglobin: 8 g/dL — ABNORMAL LOW (ref 12.0–15.0)
Immature Granulocytes: 0 %
Lymphocytes Relative: 38 %
Lymphs Abs: 1.8 10*3/uL (ref 0.7–4.0)
MCH: 23.1 pg — ABNORMAL LOW (ref 26.0–34.0)
MCHC: 29.2 g/dL — ABNORMAL LOW (ref 30.0–36.0)
MCV: 79 fL — ABNORMAL LOW (ref 80.0–100.0)
Monocytes Absolute: 0.3 10*3/uL (ref 0.1–1.0)
Monocytes Relative: 6 %
Neutro Abs: 2.4 10*3/uL (ref 1.7–7.7)
Neutrophils Relative %: 49 %
Platelets: 278 10*3/uL (ref 150–400)
RBC: 3.47 MIL/uL — ABNORMAL LOW (ref 3.87–5.11)
RDW: 17.8 % — ABNORMAL HIGH (ref 11.5–15.5)
WBC: 4.8 10*3/uL (ref 4.0–10.5)
nRBC: 0 % (ref 0.0–0.2)

## 2020-01-02 LAB — SEDIMENTATION RATE: Sed Rate: 13 mm/hr (ref 0–22)

## 2020-01-02 LAB — COMPREHENSIVE METABOLIC PANEL
ALT: 11 U/L (ref 0–44)
AST: 12 U/L — ABNORMAL LOW (ref 15–41)
Albumin: 2.9 g/dL — ABNORMAL LOW (ref 3.5–5.0)
Alkaline Phosphatase: 58 U/L (ref 38–126)
Anion gap: 7 (ref 5–15)
BUN: 9 mg/dL (ref 6–20)
CO2: 26 mmol/L (ref 22–32)
Calcium: 8.4 mg/dL — ABNORMAL LOW (ref 8.9–10.3)
Chloride: 106 mmol/L (ref 98–111)
Creatinine, Ser: 0.96 mg/dL (ref 0.44–1.00)
GFR calc Af Amer: >60 mL/min (ref >60)
GFR calc non Af Amer: >60 mL/min (ref >60)
Glucose, Bld: 130 mg/dL — ABNORMAL HIGH (ref 70–99)
Potassium: 3.2 mmol/L — ABNORMAL LOW (ref 3.5–5.1)
Sodium: 139 mmol/L (ref 135–145)
Total Bilirubin: 0.5 mg/dL (ref 0.3–1.2)
Total Protein: 6.1 g/dL — ABNORMAL LOW (ref 6.5–8.1)

## 2020-01-02 LAB — GLUCOSE, CAPILLARY
Glucose-Capillary: 141 mg/dL — ABNORMAL HIGH (ref 70–99)
Glucose-Capillary: 154 mg/dL — ABNORMAL HIGH (ref 70–99)
Glucose-Capillary: 128 mg/dL — ABNORMAL HIGH (ref 70–99)
Glucose-Capillary: 162 mg/dL — ABNORMAL HIGH (ref 70–99)

## 2020-01-02 LAB — ECHOCARDIOGRAM COMPLETE
Area-P 1/2: 3.6 cm2
Height: 63 in
S' Lateral: 3.3 cm
Weight: 3040 oz

## 2020-01-02 LAB — LIPID PANEL
Cholesterol: 181 mg/dL (ref 0–200)
HDL: 48 mg/dL (ref >40)
LDL Cholesterol: 122 mg/dL — ABNORMAL HIGH (ref 0–99)
Total CHOL/HDL Ratio: 3.8 RATIO
Triglycerides: 53 mg/dL (ref <150)
VLDL: 11 mg/dL (ref 0–40)

## 2020-01-02 LAB — MAGNESIUM: Magnesium: 1.7 mg/dL (ref 1.7–2.4)

## 2020-01-02 LAB — HEMOGLOBIN A1C
Hgb A1c MFr Bld: 7.5 % — ABNORMAL HIGH (ref 4.8–5.6)
Mean Plasma Glucose: 168.55 mg/dL

## 2020-01-02 LAB — HIV ANTIBODY (ROUTINE TESTING W REFLEX): HIV Screen 4th Generation wRfx: NONREACTIVE

## 2020-01-02 LAB — HEPATITIS C ANTIBODY: HCV Ab: NONREACTIVE

## 2020-01-02 LAB — C-REACTIVE PROTEIN: CRP: 0.7 mg/dL (ref <1.0)

## 2020-01-02 IMAGING — MR MR HEAD W/ CM
3 series · 27 of 48 positions shown · IV contrast (g GAD)
Comparison: [DATE] MRI head.

CLINICAL DATA: Stroke, follow-up.

EXAM:
MRI HEAD WITH CONTRAST
TECHNIQUE: Multiplanar, multiecho pulse sequences of the brain and surrounding
structures were obtained with intravenous contrast.
CONTRAST:  7.5mL GADAVIST GADOBUTROL 1 MMOL/ML IV SOLN

[Series 2: T1 · axial · 3.0mm · 0.94mm/px · z∈[-108,+15]mm · 11 of 48 slices shown (1 of 2)]
[im 3/48]
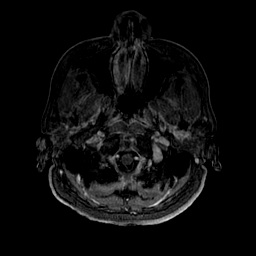
[im 7/48]
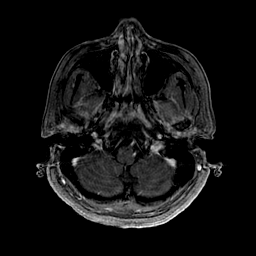
[im 9/48]
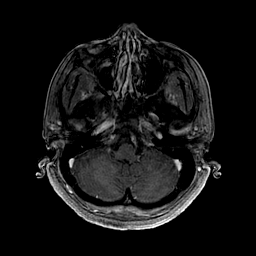
[im 15/48]
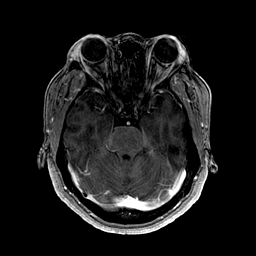
[im 22/48]
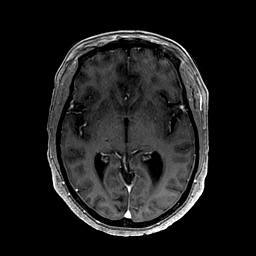
[im 24/48]
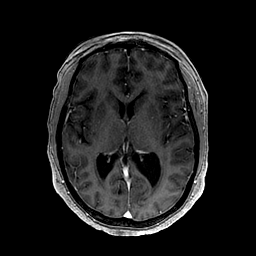
[im 26/48]
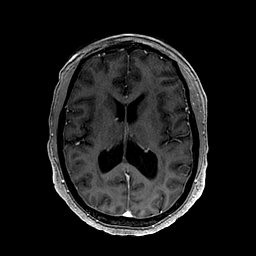
[im 33/48]
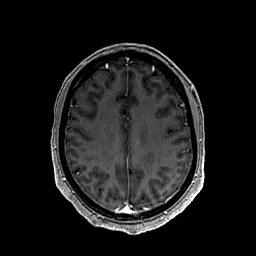
[im 39/48]
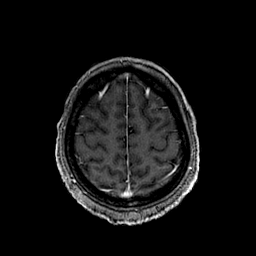
[im 41/48]
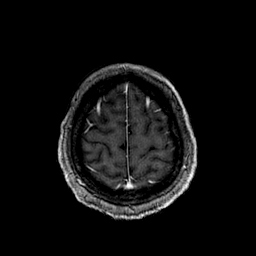
[im 45/48]
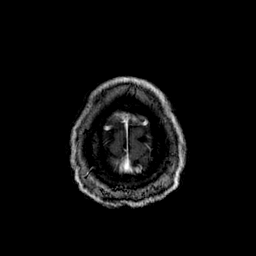

[Series 3: T1 · coronal · 5.0mm · 0.43mm/px · 5 of 28 slices shown (2 of 2)]
[im 1/28]
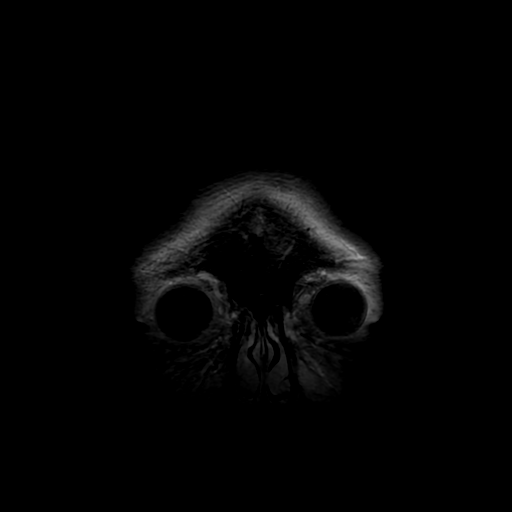
[im 5/28]
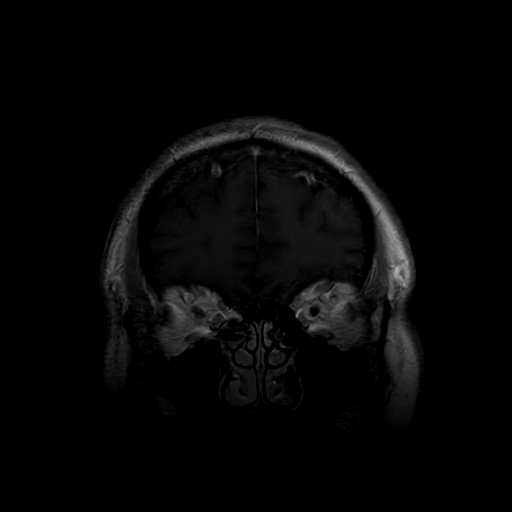
[im 9/28]
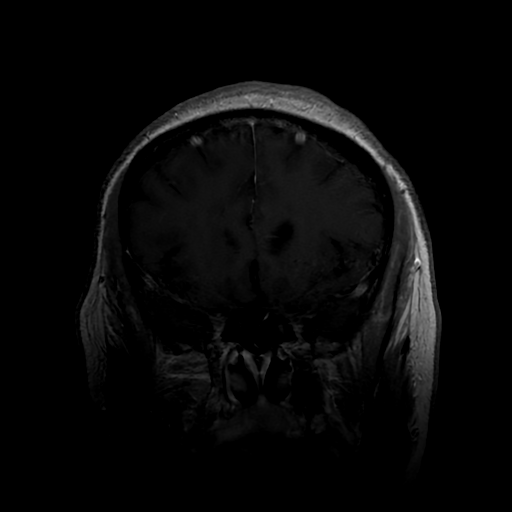
[im 15/28]
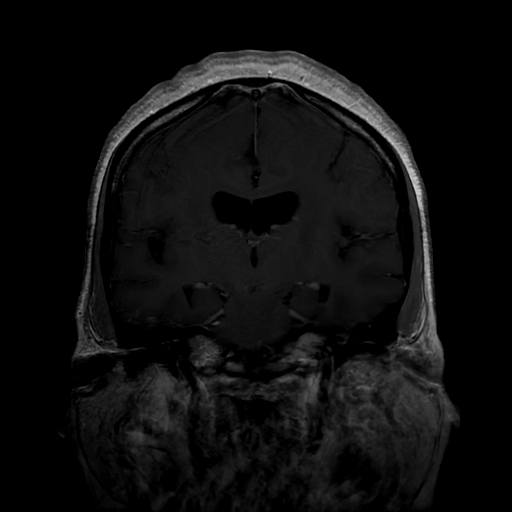
[im 23/28]
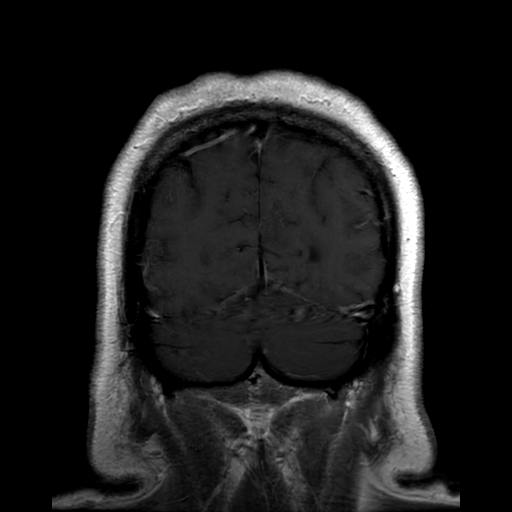

[Series 4: FLAIR post-contrast · sagittal · 5.0mm · 0.47mm/px · 11 of 23 slices shown]
[im 1/23]
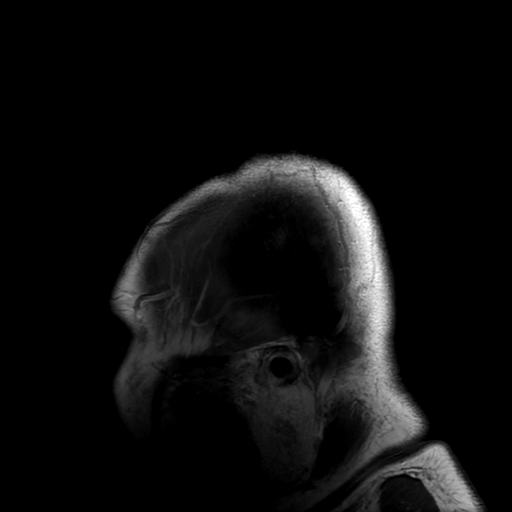
[im 3/23]
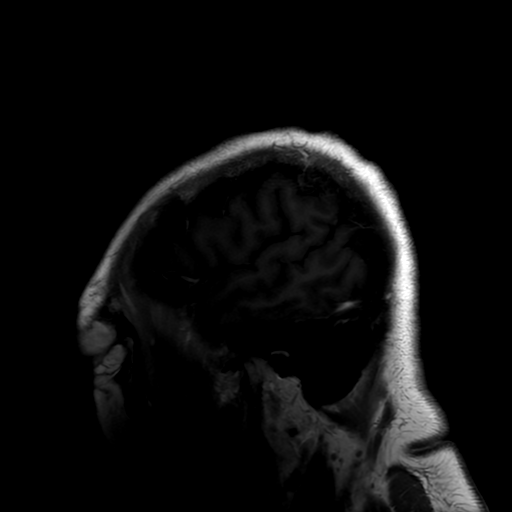
[im 5/23]
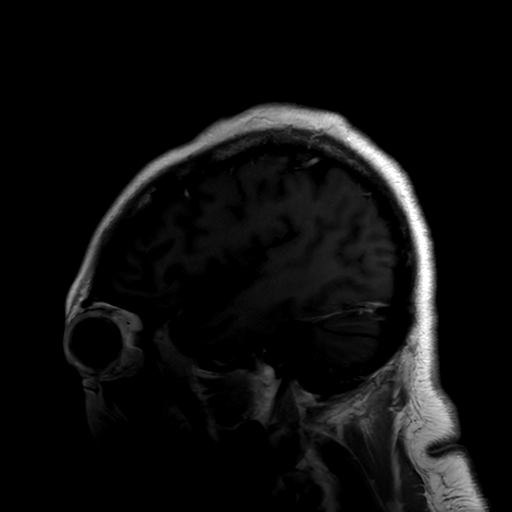
[im 7/23]
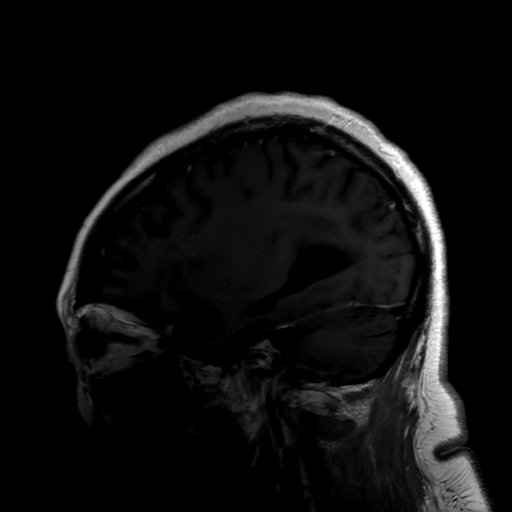
[im 9/23]
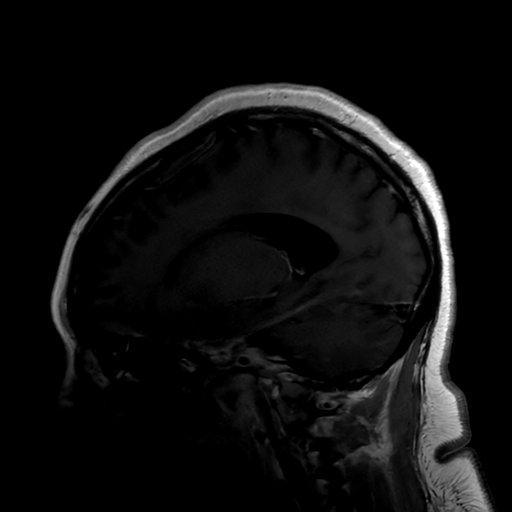
[im 12/23]
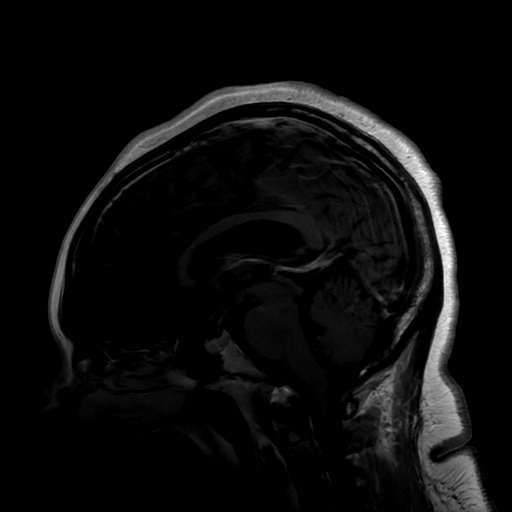
[im 14/23]
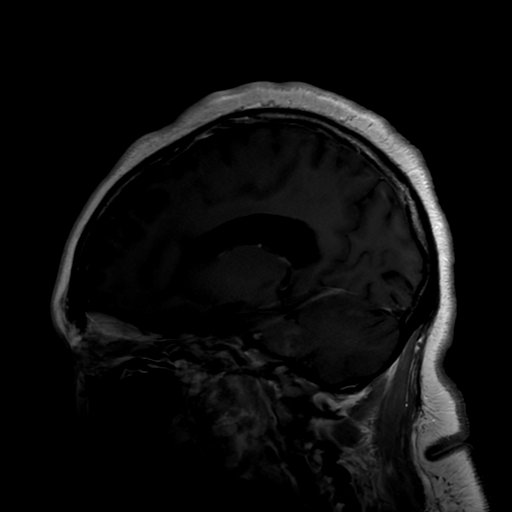
[im 16/23]
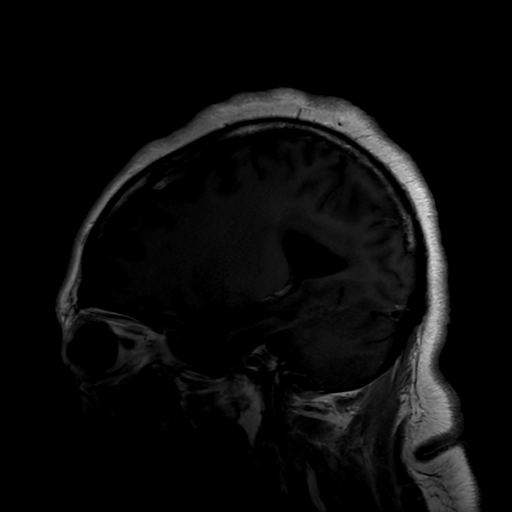
[im 18/23]
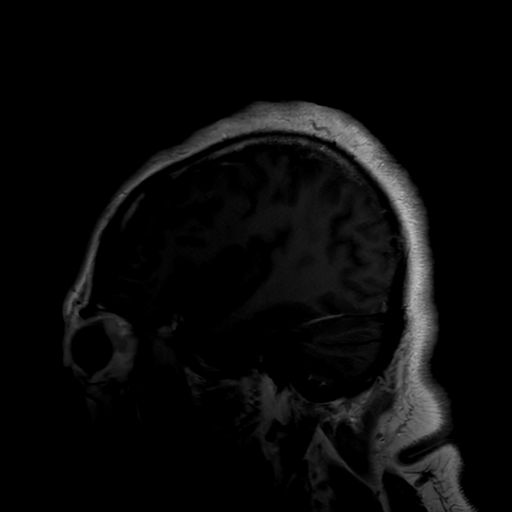
[im 20/23]
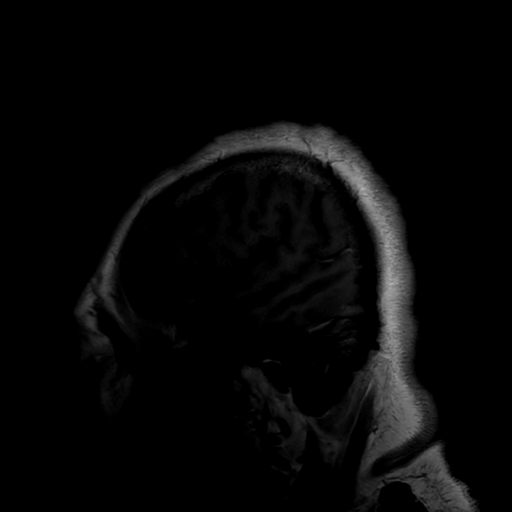
[im 23/23]
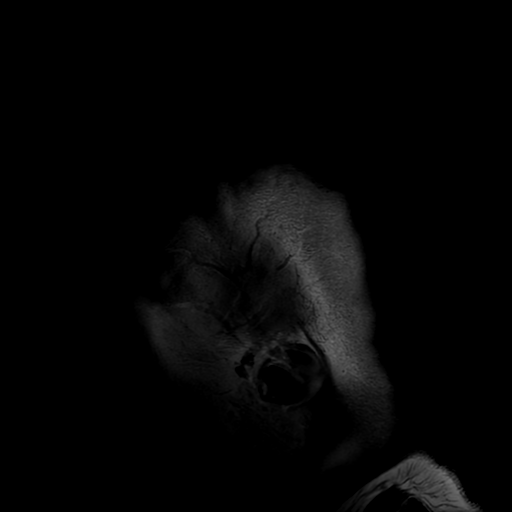

[27 of 48 positions shown; findings below may reference images not displayed]

FINDINGS: Brain: Mild diffuse parenchymal volume loss with ex vacuo
dilatation. Site of prior restricted diffusion involving the right
middle cerebellar peduncle, pons and right trigeminal root entry
zone demonstrates incomplete peripheral enhancement. Chronic
microvascular ischemic changes. There are no other foci of abnormal
intracranial enhancement. No midline shift or extra-axial fluid
collection. No mass lesion.

Vascular: Normal flow voids.

Skull and upper cervical spine: Normal marrow signal.

Sinuses/Orbits: Chronic medial right orbital wall posttraumatic
deformity. Clear paranasal sinuses. No mastoid effusion.

Other: None.
IMPRESSION: Incomplete peripheral enhancement at site of prior right middle
cerebellar peduncle/pontine restricted diffusion involving the
trigeminal root entry zone is highly suspicious for demyelinating
lesion however cannot exclude acute to subacute infarct.

No additional sites of abnormal intracranial enhancement.

## 2020-01-02 IMAGING — MR MR CERVICAL SPINE W/O CM
6 series · 31 of 48 positions shown · non-contrast
Comparison: Prior brain MRI from [DATE].

CLINICAL DATA: Initial evaluation for multiple sclerosis, new
event.

EXAM:
MRI CERVICAL SPINE WITHOUT CONTRAST
TECHNIQUE: Multiplanar, multisequence MR imaging of the cervical spine was
performed. No intravenous contrast was administered.

[Series 9: T2 · sagittal · 3.0mm · 0.69mm/px · 4 of 13 slices shown (1 of 2)]
[im 1/13]
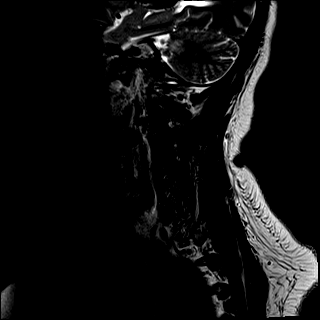
[im 5/13]
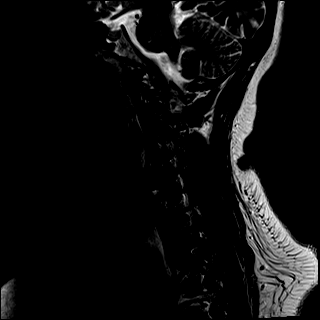
[im 9/13]
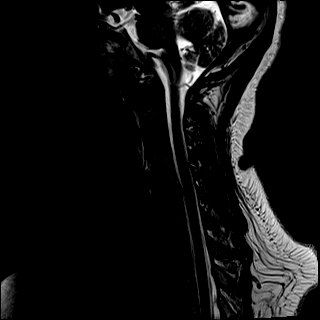
[im 13/13]
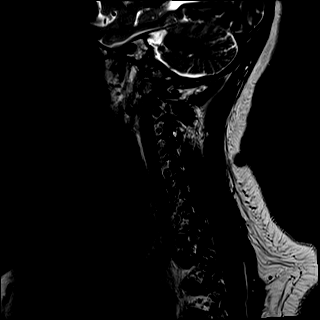

[Series 10: T1 · sagittal · 3.0mm · 0.69mm/px · 4 of 13 slices shown (1 of 2)]
[im 1/13]
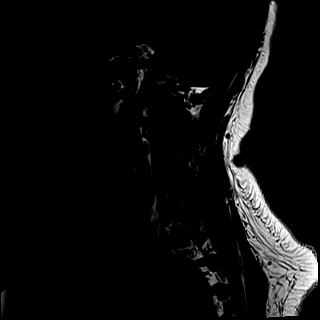
[im 5/13]
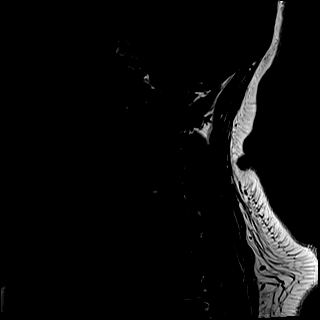
[im 9/13]
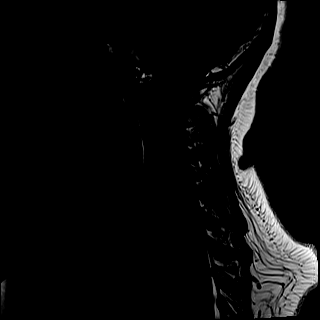
[im 13/13]
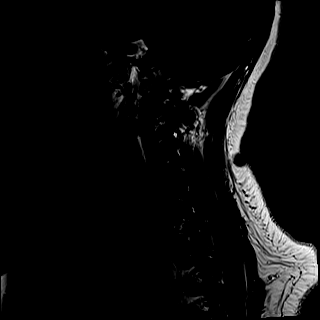

[Series 11: STIR · sagittal · 3.0mm · 0.86mm/px · 4 of 13 slices shown]
[im 1/13]
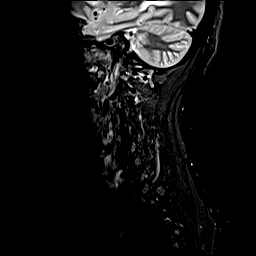
[im 5/13]
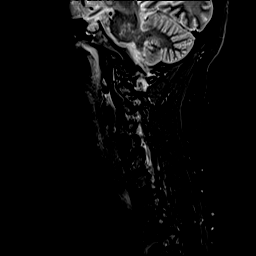
[im 9/13]
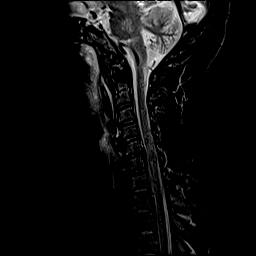
[im 13/13]
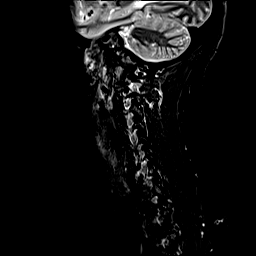

[Series 12: T2 · axial · 3.0mm · 0.66mm/px · z∈[-238,-127]mm · 9 of 36 slices shown (2 of 2)]
[im 1/36]
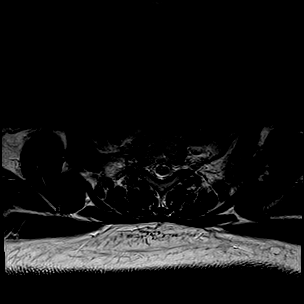
[im 7/36]
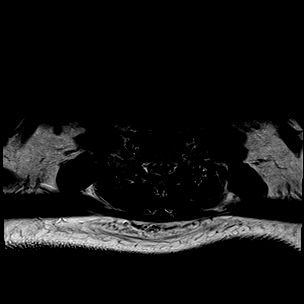
[im 10/36]
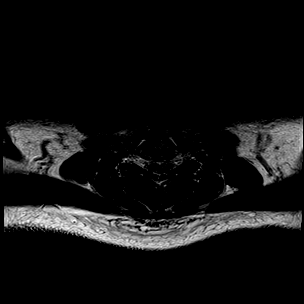
[im 16/36]
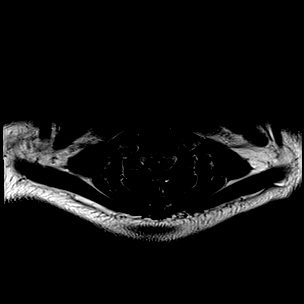
[im 20/36]
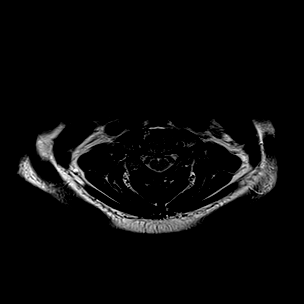
[im 26/36]
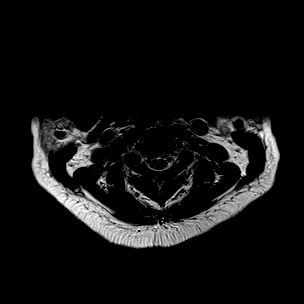
[im 29/36]
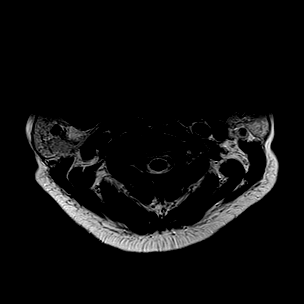
[im 32/36]
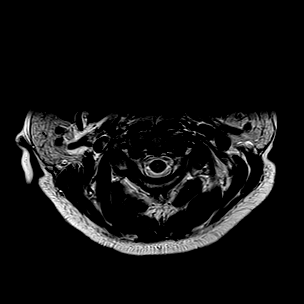
[im 36/36]
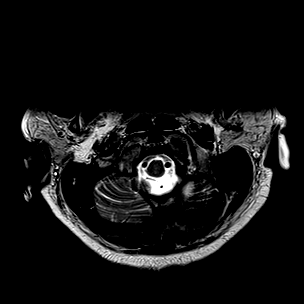

[Series 13: GRE · axial · 3.0mm · 0.39mm/px · 1 of 36 slices shown]
[im 1/36]
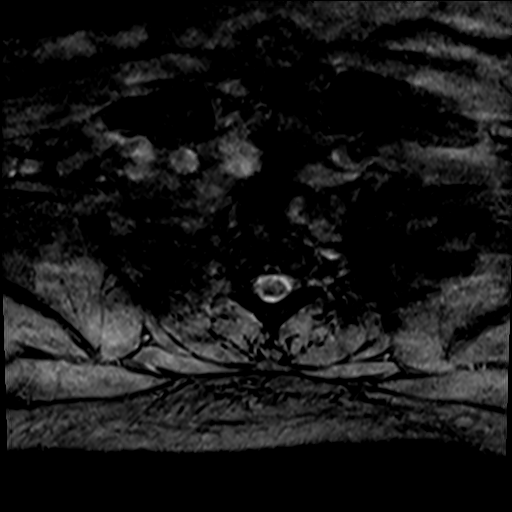

[Series 14: T1 · axial · 3.0mm · 0.39mm/px · z∈[-238,-127]mm · 9 of 36 slices shown (2 of 2)]
[im 1/36]
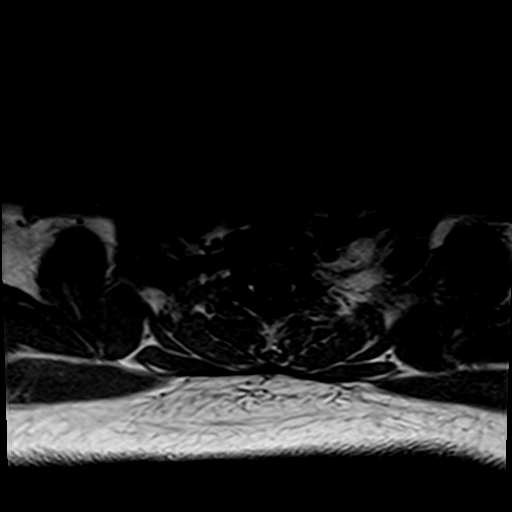
[im 7/36]
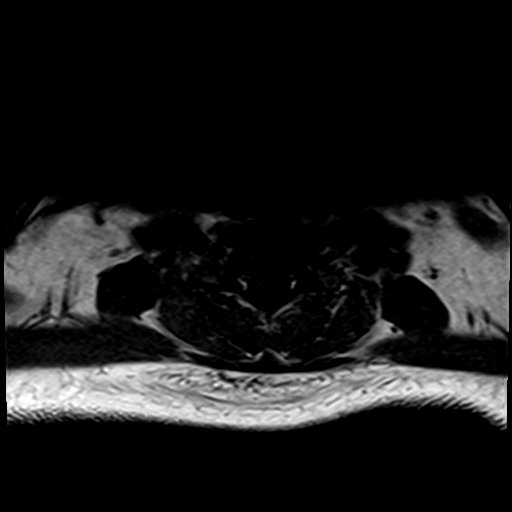
[im 10/36]
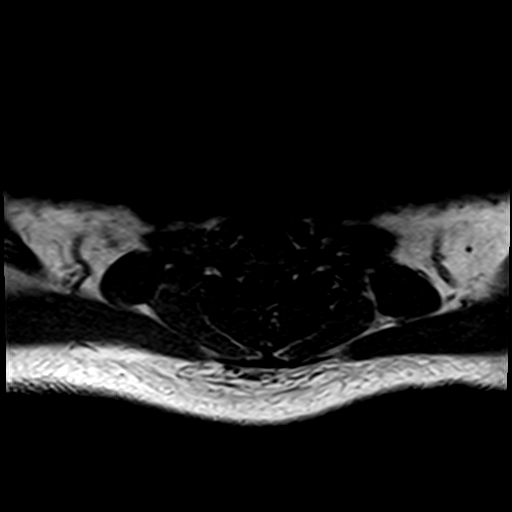
[im 16/36]
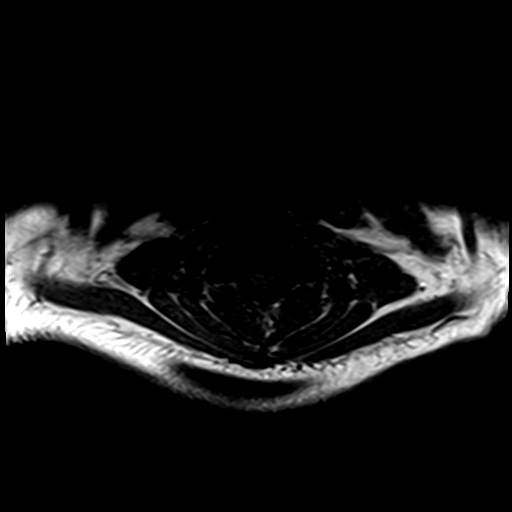
[im 20/36]
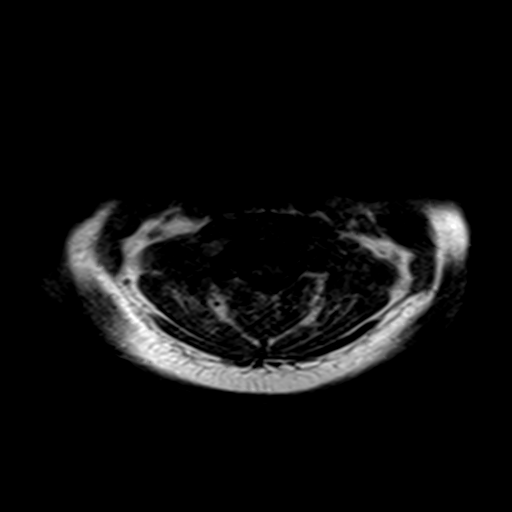
[im 26/36]
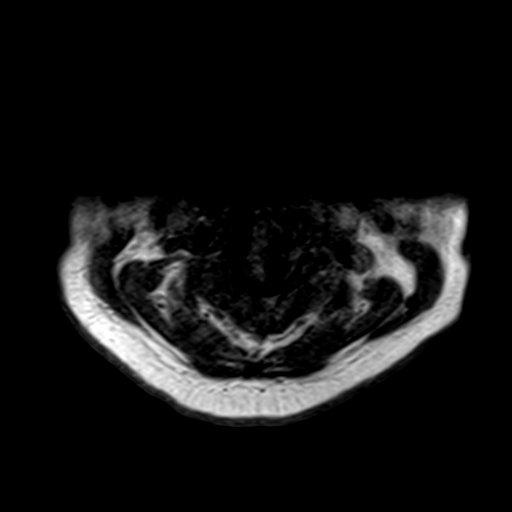
[im 29/36]
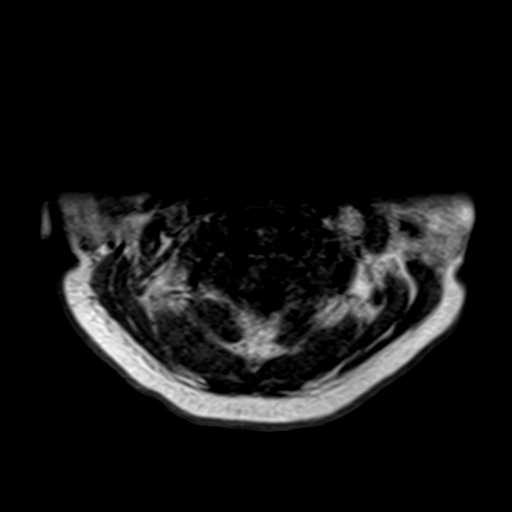
[im 32/36]
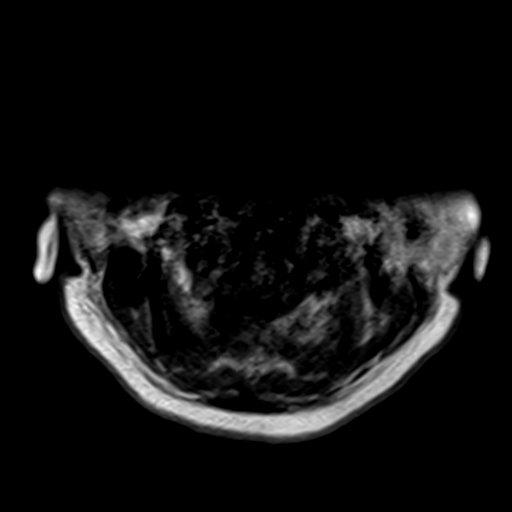
[im 36/36]
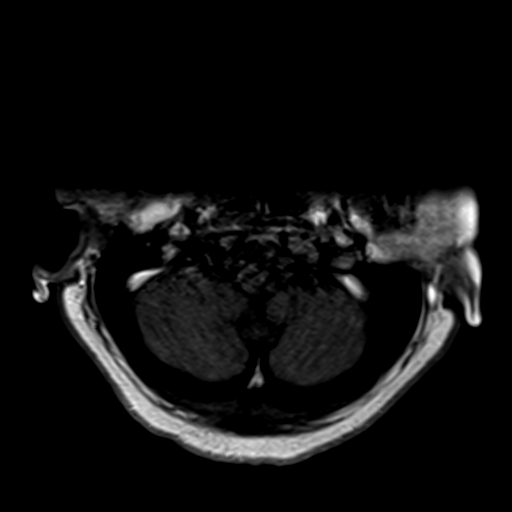

[31 of 48 positions shown; findings below may reference images not displayed]

FINDINGS: Alignment: Straightening of the normal cervical lordosis. No
listhesis or subluxation.

Vertebrae: Vertebral body height maintained without evidence for
acute or chronic fracture. Bone marrow signal intensity within
normal limits. No discrete or worrisome osseous lesions. No abnormal
marrow edema.

Cord: Evaluation of the cervical spinal cord mildly limited by
motion. No convincing cord signal abnormality to suggest
demyelinating disease. Cord caliber and morphology within normal
limits.

Posterior Fossa, vertebral arteries, paraspinal tissues: T2 signal
abnormality involving the right middle cerebellar peduncle again
noted, better seen on prior brain MRI. Additional nonspecific
T2/STIR signal intensity noted within the pons. Craniocervical
junction within normal limits. Paraspinous and prevertebral soft
tissues are normal. Normal flow voids seen within the vertebral
arteries bilaterally.

Disc levels:

C2-C3: Mild right-sided uncovertebral hypertrophy without
significant disc bulge. No significant spinal stenosis. Mild right
C3 foraminal stenosis. No left foraminal encroachment.

C3-C4: Mild bilateral uncovertebral hypertrophy without significant
disc bulge. No canal or foraminal stenosis.

C4-C5:  Unremarkable.

C5-C6: Diffuse disc bulge with bilateral uncovertebral hypertrophy.
Flattening and partial effacement of the ventral thecal sac with
resultant mild spinal stenosis. No significant cord deformity.
Foramina are remain patent.

C6-C7: Disc desiccation with minimal disc bulge. No canal or
foraminal stenosis.

C7-T1: Normal interspace. Mild facet hypertrophy. No canal or
foraminal stenosis.

Visualized upper thoracic spine demonstrates no significant finding.
IMPRESSION: 1. Normal MRI appearance of the cervical spinal cord, with no
convincing cord signal abnormality to suggest demyelinating disease.
2. Degenerative disc bulge at C5-6 with resultant mild spinal
stenosis.
3. Right-sided uncovertebral hypertrophy at C2-3 with resultant mild
right C3 foraminal stenosis. No other significant foraminal
encroachment within the cervical spine.

## 2020-01-02 MED ORDER — AMLODIPINE BESYLATE 5 MG PO TABS
5.0000 mg | ORAL_TABLET | Freq: Every day | ORAL | Status: DC
  Administered 2020-01-02 – 2020-01-03 (×2): 5 mg via ORAL
  Filled 2020-01-02 (×2): qty 1

## 2020-01-02 MED ORDER — DIAZEPAM 5 MG PO TABS
5.0000 mg | ORAL_TABLET | Freq: Once | ORAL | Status: DC | PRN
  Filled 2020-01-02 (×2): qty 1

## 2020-01-02 MED ORDER — POTASSIUM CHLORIDE CRYS ER 20 MEQ PO TBCR
40.0000 meq | EXTENDED_RELEASE_TABLET | Freq: Two times a day (BID) | ORAL | Status: AC
  Administered 2020-01-02: 40 meq via ORAL
  Filled 2020-01-02 (×2): qty 2

## 2020-01-02 MED ORDER — ENOXAPARIN SODIUM 40 MG/0.4ML ~~LOC~~ SOLN
40.0000 mg | SUBCUTANEOUS | Status: DC
  Administered 2020-01-03: 40 mg via SUBCUTANEOUS
  Filled 2020-01-02 (×2): qty 0.4

## 2020-01-02 MED ORDER — GADOBUTROL 1 MMOL/ML IV SOLN
7.5000 mL | Freq: Once | INTRAVENOUS | Status: AC | PRN
  Administered 2020-01-02: 7.5 mL via INTRAVENOUS

## 2020-01-02 MED ORDER — IOHEXOL 350 MG/ML SOLN
75.0000 mL | Freq: Once | INTRAVENOUS | Status: AC | PRN
  Administered 2020-01-02: 75 mL via INTRAVENOUS

## 2020-01-02 MED ORDER — ATORVASTATIN CALCIUM 40 MG PO TABS
40.0000 mg | ORAL_TABLET | Freq: Every day | ORAL | Status: DC
  Filled 2020-01-02: qty 1

## 2020-01-02 MED ORDER — MAGNESIUM CHLORIDE 64 MG PO TBEC
2.0000 | DELAYED_RELEASE_TABLET | Freq: Once | ORAL | Status: AC
  Administered 2020-01-02: 128 mg via ORAL
  Filled 2020-01-02: qty 2

## 2020-01-02 NOTE — Progress Notes (Addendum)
NEUROLOGY PROGRESS NOTE  Subjective: Patient is awake.  At times will take part in exam at times does not desire to.  Has no other complaints at this point time.  Exam: Vitals:   01/01/20 2320 01/01/20 2350  BP: (!) 168/91 (!) 169/83  Pulse: 67 65  Resp: (!) 27 16  Temp: 98.7 F (37.1 C) 98.7 F (37.1 C)  SpO2: 99% 98%     Neuro:  Mental Status: Alert, oriented, however at times does not want to answer questions.  Continues to show poor attention.  Speech is dysarthric.  Patient uses short answers to questions.  Able to follow simple commands but not complex.  At times will get agitated when asked questions that have been asked of her multiple times.   Cranial Nerves: II: Right-sided visual field cut. III,IV, VI: ptosis not present, continues to have difficulty with volitional eye movement to the right of midline, both are tracking and has mild saccades.  It is noted that she is able to briefly track finger cross midline.   Right facial droop  VIII: hearing normal bilaterally XII: midline tongue extension Motor: Right upper extremity is 3/5 and only briefly lifts her right arm off the bed.  Right lower extremity unable to lift antigravity.  Left upper extremity 5/5 and left lower extremity 4/5 Sensory: Diminished sensation on the right upper and lower extremity. Deep Tendon Reflexes: 1+ on right upper and lower extremity, 2+ left upper and lower extremity Cerebellar: Would not take part in this exam     Medications:  Scheduled: .  stroke: mapping our early stages of recovery book   Does not apply Once  . aspirin  300 mg Rectal Daily   Or  . aspirin  325 mg Oral Daily  . insulin aspart  0-15 Units Subcutaneous TID WC  . insulin aspart  0-5 Units Subcutaneous QHS  . 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 A demyelinating lesion commonly involves this region but is considered less likely given appearance of white matter disease. Age advanced chronic microvascular ischemic changes. Electronically Signed   By: Macy Mis M.D.   On: 01/01/2020 16:58   MR CERVICAL SPINE WO CONTRAST  Result Date: 01/02/2020 . IMPRESSION: 1. Normal MRI appearance of the cervical spinal cord, with no convincing cord signal abnormality to suggest demyelinating disease. 2. Degenerative disc bulge at C5-6 with resultant mild spinal stenosis. 3. Right-sided uncovertebral hypertrophy at C2-3 with resultant mild right C3 foraminal stenosis. No other significant foraminal encroachment within the cervical spine. Electronically Signed   By: Jeannine Boga M.D.   On: 01/02/2020 01:29     Etta Quill PA-C [facility] [phone]     NEUROHOSPITALIST ADDENDUM Performed a face to face diagnostic evaluation.   I have reviewed the contents of history and physical exam as documented by PA/ARNP/Resident and agree with above documentation.  I have discussed and formulated the above plan as documented. Edits to the note have been made as needed.  44 year old female with polysubstance abuse including cocaine abuse and THC, hypertension, diabetes mellitus presents with 3 week history of generalized weakness, difficulty walking  as well as dizziness, blurry vision-has been seen in  the ED multiple times without clear diagnosis.  Approximately 1 week ago patient endorses sudden onset right arm and leg weakness and numbness and difficulty walking, slurred speech.  Patient denies IV drug abuse and states that she has had a single sexual partner since the last 1 year-denies history of STDs.  MRI brain was performed  this time which showed restriction diffusion in the right middle cerebral peduncle-suggestive of demyelination versus subacute stroke.  Patient diffuse white matter disease throughout that appears nonspecific and not the periventricular or juxtacortical lesions typical for MS. cervical spine also shows no evidence of demyelination. MRI Brain w contrast shows peripheral enhancement-often seen in MS, but can also be present with subacute infarcts.   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  Recommendations -LP to check for CSF protein, glucose, oligoclonal bands, IgG index, cell count, Gram stain CSF VDRL -Check ESR, CRP, hepatitis C, RPR -Check SSA and SSB, ANA, c-ANCA, p-ANCA, RF, antiphospholipid antibodies -Echocardiogram - blood cultures -AIC, lipid profile - q4 neurochecks -We will strongly consider empiric steroids for 5 days -PT OT evaluation -DVT prophylaxis held for lumbar puncture tomorrow.  Neurology will continue to follow.    Karena Addison Mamie Diiorio MD [facility] [phone]   If 7pm to 7am, please call on call as listed on AMION.

## 2020-01-02 NOTE — Progress Notes (Signed)
Patient requested  Permission for her signigicant other to stay overnight with her.  RN and charge nurse explained about the hospital policy and  RN assured that patient will be fine by herself and the hospital staff will be rounding every hour on patient.  Patient got upset and refused her night medication.  Patient stated ' I dont need any medications,  Just leave me alone.'

## 2020-01-02 NOTE — Progress Notes (Signed)
Carotid artery duplex completed. Refer to "CV Proc" under chart review to view preliminary results.  01/02/2020 11:53 AM Eula Fried., MHA, RVT, RDCS, RDMS

## 2020-01-02 NOTE — Progress Notes (Signed)
Got patient to MRI from vascular. Patient was somewhat agreeable in vascular but completely scanning once in MRI department. Patient is stating that she is going to her room and will not finish scan. RN notified.

## 2020-01-02 NOTE — Progress Notes (Signed)
PROGRESS NOTE    Kaitlyn Gray  WTU:[id number] DOB: [birth year] DOA: 01/01/2020 PCP: Lavinia Sharps, NP      Brief Narrative:  Kaitlyn Gray is a 44 y.o. F with Bipolar disorder, DM, polysubstance abuse, medication nonadherence, and HTN who presented with ataxia and right sided hemiparesis for over a week.  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      Assessment & Plan:  Right hemiparesis and ataxia MRI showed patchy enhancement of the cerebellum and adjacent pons, consistent with stroke versus multiple sclerosis.  Neurology were consulted, who recommended MRI brain with contrast and MR cervical spine.  MRI c-spine unremarkable.  MR brain with contrast showed incomplete peripheral enhancement of right middle cerebellar peduncle/pontine lesion. -Follow-up CT angiogram head and neck -Consult to neurology, appreciate recommendations -LP if directed by neurology  -Continue aspirin -If ischemic stroke is cause, event is >1 week out, will normalize BP -Start statin   Acute metabolic encephalopathy Mild, patient is somewhat disoriented to place, but easily redirectable, cooperative.  I suspect this is from her stroke, superimposed on some possible baseline cognitive impairment (no collateral is available, despite efforts to reach family).  Bipolar disorder Stable  Diabetes, type 2, poorly controlled wtihout complication A1c close to rnomal.  Glucoses here controlled with little insulin correction -Continue SS correction insulin  Hypertension Not on meds at home, BP slightly elevated. -Start amlodipine  Morbid obesity BMI 33 with DM and HTN  Anemia, microcytic No baseline Hgb stable, without clinical bleeding -Follow up with PCP -Check iron stores        Disposition: Status is: Inpatient  Remains inpatient appropriate because:Altered mental status   Dispo: The patient is from: Home               Anticipated d/c is to: SNF              Anticipated d/c date is: 2 days              Patient currently is not medically stable to d/c.    Patietn admitted with ataxia, hemiparesis and confusion.  She remains confused pleasantly.  Imaging today indeterminate, will defer to Neurology re: LP and further work up.          MDM: The below labs and imaging reports were reviewed and summarized above.  Medication management as above.  The patient has acute neurological deficits (encephalopathy)   DVT prophylaxis: enoxaparin (LOVENOX) injection 40 mg Start: 01/02/20 1400  Code Status: FULL Family Communication: Attempted call to aunt, no answer, no other family listed in chart, patient unable to provide other contact infromation    Consultants:   Neurology    Procedures:   7/18 MRI brain -- patchy restricted diffusion in right middle cerebellar peduncle and adjacent pons  7/18 MRI cervical spine -- normal c-spine  7/19 carotid ultrasound - no stenosis  7/19 echocardiogram -- pending  7/19 MRI brain with contrast --peripheral enhancement of restricted diffusion area noted above  7/19 CT angiogram of the head and neck --pending  Antimicrobials:      Culture data:              Subjective: The patient is still somewhat confused, she still has right-sided weakness and ataxia.  She has no headache, fever, vomiting, loss of consciousness, seizure.     Objective: Vitals:   01/01/20 1930 01/01/20 1945 01/01/20 2320 01/01/20 2350  BP: (!) 143/122 [name])  157/99 (!) 168/91 (!) 169/83  Pulse: 69 67 67 65  Resp: (!) 22 12 (!) 27 16  Temp:   98.7 F (37.1 C) 98.7 F (37.1 C)  TempSrc:   Oral Oral  SpO2:   99% 98%  Weight:      Height:       No intake or output data in the 24 hours ending 01/02/20 1515 Filed Weights   01/01/20 0822  Weight: 86.2 kg    Examination: General appearance:  adult female, alert and in no acute distress.  Trying to eat her lunch  with her left hand, nondominant. HEENT: Anicteric, conjunctiva pink, lids and lashes normal. No nasal deformity, discharge, epistaxis.  Lips moist.   Skin: Warm and dry.   No suspicious rashes or lesions. Cardiac: RRR, nl S1-S2, no murmurs appreciated.  Capillary refill is brisk.  JVP normal.  No LE edema.  Radial pulses 2+ and symmetric. Respiratory: Normal respiratory rate and rhythm.  CTAB without rales or wheezes. Abdomen: Abdomen soft.  No TTP or guarding. No ascites, distension, hepatosplenomegaly.   MSK: No deformities or effusions.  Normal muscle bulk and tone. Neuro: Awake and alert.  EOMI, right-sided weakness and ataxia, left-sided strength and coordination seem close to normal. Speech fluent.    Psych: Sensorium intact and responding to questions, attention somewhat blunted, affect restricted, judgment insight appear impaired.  SLUMS 10/30 and patient states she "lives in [place]" and she is currently at the "[facility]" and "no, I'm not in [place]"     Data Reviewed: I have personally reviewed following labs and imaging studies:  CBC: Recent Labs  Lab 01/01/20 1332 01/02/20 0504  WBC 6.1 4.8  NEUTROABS 3.5 2.4  HGB 8.5* 8.0*  HCT 29.3* 27.4*  MCV 79.8* 79.0*  PLT 280 278   Basic Metabolic Panel: Recent Labs  Lab 01/01/20 1332 01/02/20 0504  NA 143 139  K 3.2* 3.2*  CL 106 106  CO2 27 26  GLUCOSE 181* 130*  BUN 13 9  CREATININE 1.03* 0.96  CALCIUM 8.9 8.4*  MG  --  1.7   GFR: Estimated Creatinine Clearance: 77 mL/min (by C-G formula based on SCr of 0.96 mg/dL). Liver Function Tests: Recent Labs  Lab 01/01/20 1332 01/02/20 0504  AST 15 12*  ALT 13 11  ALKPHOS 63 58  BILITOT 0.5 0.5  PROT 6.4* 6.1*  ALBUMIN 3.2* 2.9*   No results for input(s): LIPASE, AMYLASE in the last 168 hours. No results for input(s): AMMONIA in the last 168 hours. Coagulation Profile: No results for input(s): INR, PROTIME in the last 168 hours. Cardiac  Enzymes: No results for input(s): CKTOTAL, CKMB, CKMBINDEX, TROPONINI in the last 168 hours. BNP (last 3 results) No results for input(s): PROBNP in the last 8760 hours. HbA1C: Recent Labs    01/02/20 0504  HGBA1C 7.5*   CBG: Recent Labs  Lab 01/01/20 2153 01/02/20 0701 01/02/20 1340  GLUCAP 241* 141* 154*   Lipid Profile: Recent Labs    01/02/20 0504  CHOL 181  HDL 48  LDLCALC 122*  TRIG 53  CHOLHDL 3.8   Thyroid Function Tests: No results for input(s): TSH, T4TOTAL, FREET4, T3FREE, THYROIDAB in the last 72 hours. Anemia Panel: No results for input(s): VITAMINB12, FOLATE, FERRITIN, TIBC, IRON, RETICCTPCT in the last 72 hours. Urine analysis:    Component Value Date/Time   COLORURINE YELLOW 01/01/2020 1803   APPEARANCEUR CLOUDY (A) 01/01/2020 1803   LABSPEC 1.016 01/01/2020 1803  PHURINE 8.0 01/01/2020 1803   GLUCOSEU 150 (A) 01/01/2020 1803   HGBUR NEGATIVE 01/01/2020 1803   BILIRUBINUR NEGATIVE 01/01/2020 1803   KETONESUR NEGATIVE 01/01/2020 1803   PROTEINUR NEGATIVE 01/01/2020 1803   UROBILINOGEN 0.2 10/06/2014 0856   NITRITE NEGATIVE 01/01/2020 1803   LEUKOCYTESUR NEGATIVE 01/01/2020 1803   Sepsis Labs: @LABRCNTIP (procalcitonin:4,lacticacidven:4)  ) Recent Results (from the past 240 hour(s))  SARS Coronavirus 2 by RT PCR (hospital order, performed in [facility] hospital lab) Nasopharyngeal Nasopharyngeal Swab     Status: None   Collection Time: 01/01/20  6:03 PM   Specimen: Nasopharyngeal Swab  Result Value Ref Range Status   SARS Coronavirus 2 NEGATIVE NEGATIVE Final    Comment: (NOTE) SARS-CoV-2 target nucleic acids are NOT DETECTED.  The SARS-CoV-2 RNA is generally detectable in upper and lower respiratory specimens during the acute phase of infection. The lowest concentration of SARS-CoV-2 viral copies this assay can detect is 250 copies / mL. A negative result does not preclude SARS-CoV-2 infection and should not be used as the sole basis  for treatment or other patient management decisions.  A negative result may occur with improper specimen collection / handling, submission of specimen other than nasopharyngeal swab, presence of viral mutation(s) within the areas targeted by this assay, and inadequate number of viral copies (<250 copies / mL). A negative result must be combined with clinical observations, patient history, and epidemiological information.  Fact Sheet for Patients:   01/03/20  Fact Sheet for Healthcare Providers: BoilerBrush.com.cy  This test is not yet approved or  cleared by the https://pope.com/ FDA and has been authorized for detection and/or diagnosis of SARS-CoV-2 by FDA under an Emergency Use Authorization (EUA).  This EUA will remain in effect (meaning this test can be used) for the duration of the COVID-19 declaration under Section 564(b)(1) of the Act, 21 U.S.C. section 360bbb-3(b)(1), unless the authorization is terminated or revoked sooner.  Performed at [facility], [address] [place], [place]          Radiology Studies: DG Chest 2 View  Result Date: 01/01/2020 CLINICAL DATA:  Altered mental status, weakness and facial droop, possible CVA EXAM: CHEST - 2 VIEW COMPARISON:  Radiograph 12/16/2019, CT 03/24/2018 FINDINGS: Low lung volumes with ill-defined opacities in the lung bases possibly reflecting atelectasis though consolidation or sequela of aspiration could have a similar appearance in the setting of altered mental status given some mild basilar airways thickening as well. Mild cardiomegaly is similar to prior counting for differences in technique. No pneumothorax or visible effusion. No acute osseous or soft tissue abnormality. Telemetry leads overlie the chest. IMPRESSION: Low lung volumes with ill-defined opacities in the lung bases, favor atelectasis though consolidation or aspiration could present  similarly. Electronically Signed   By: 05/24/2018 M.D.   On: 01/01/2020 19:18   MR BRAIN WO CONTRAST  Result Date: 01/01/2020 CLINICAL DATA:  Possible stroke EXAM: MRI HEAD WITHOUT CONTRAST TECHNIQUE: Multiplanar, multiecho pulse sequences of the brain and surrounding structures were obtained without intravenous contrast. COMPARISON:  None. FINDINGS: Brain: There is mildly reduced diffusion along the right middle cerebellar peduncle and adjacent pons including the trigeminal root entry zone. Foci of susceptibility are present in the thalamus bilaterally compatible with chronic microhemorrhages or less likely mineralization. Lateral ventricles are mildly prominent likely on an ex vacuo basis. There is patchy and confluent T2 hyperintensity in the supratentorial and pontine white matter, which is nonspecific but may reflect moderate chronic  microvascular ischemic changes, greater than expected for age. There is no intracranial mass or significant mass effect. There is no hydrocephalus or extra-axial fluid collection. Vascular: Major vessel flow voids at the skull base are preserved. Skull and upper cervical spine: Normal marrow signal is preserved. Sinuses/Orbits: Minor mucosal thickening. There is chronic deformity of the right medial orbital wall, which may be posttraumatic. Other: Sella is unremarkable.  Mastoid air cells are clear. 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 A demyelinating lesion commonly involves this region but is considered less likely given appearance of white matter disease. Age advanced chronic microvascular ischemic changes. Electronically Signed   By: Guadlupe Spanish M.D.   On: 01/01/2020 16:58   MR BRAIN W CONTRAST  Result Date: 01/02/2020 CLINICAL DATA:  Stroke, follow-up. EXAM: MRI HEAD WITH CONTRAST TECHNIQUE: Multiplanar, multiecho pulse sequences of  the brain and surrounding structures were obtained with intravenous contrast. CONTRAST:  7.91mL GADAVIST GADOBUTROL 1 MMOL/ML IV SOLN COMPARISON:  01/01/2020 MRI head. FINDINGS: Brain: Mild diffuse parenchymal volume loss with ex vacuo dilatation. Site of prior restricted diffusion involving the right middle cerebellar peduncle, pons and right trigeminal root entry zone demonstrates incomplete peripheral enhancement. Chronic microvascular ischemic changes. There are no other foci of abnormal intracranial enhancement. No midline shift or extra-axial fluid collection. No mass lesion. Vascular: Normal flow voids. Skull and upper cervical spine: Normal marrow signal. Sinuses/Orbits: Chronic medial right orbital wall posttraumatic deformity. Clear paranasal sinuses. No mastoid effusion. Other: None. IMPRESSION: Incomplete peripheral enhancement at site of prior right middle cerebellar peduncle/pontine restricted diffusion involving the trigeminal root entry zone is highly suspicious for demyelinating lesion however cannot exclude acute to subacute infarct. No additional sites of abnormal intracranial enhancement. Electronically Signed   By: Stana Bunting M.D.   On: 01/02/2020 14:47   MR CERVICAL SPINE WO CONTRAST  Result Date: 01/02/2020 CLINICAL DATA:  Initial evaluation for multiple sclerosis, new event. EXAM: MRI CERVICAL SPINE WITHOUT CONTRAST TECHNIQUE: Multiplanar, multisequence MR imaging of the cervical spine was performed. No intravenous contrast was administered. COMPARISON:  Prior brain MRI from 01/01/2020. FINDINGS: Alignment: Straightening of the normal cervical lordosis. No listhesis or subluxation. Vertebrae: Vertebral body height maintained without evidence for acute or chronic fracture. Bone marrow signal intensity within normal limits. No discrete or worrisome osseous lesions. No abnormal marrow edema. Cord: Evaluation of the cervical spinal cord mildly limited by motion. No convincing cord  signal abnormality to suggest demyelinating disease. Cord caliber and morphology within normal limits. Posterior Fossa, vertebral arteries, paraspinal tissues: T2 signal abnormality involving the right middle cerebellar peduncle again noted, better seen on prior brain MRI. Additional nonspecific T2/STIR signal intensity noted within the pons. Craniocervical junction within normal limits. Paraspinous and prevertebral soft tissues are normal. Normal flow voids seen within the vertebral arteries bilaterally. Disc levels: C2-C3: Mild right-sided uncovertebral hypertrophy without significant disc bulge. No significant spinal stenosis. Mild right C3 foraminal stenosis. No left foraminal encroachment. C3-C4: Mild bilateral uncovertebral hypertrophy without significant disc bulge. No canal or foraminal stenosis. C4-C5:  Unremarkable. C5-C6: Diffuse disc bulge with bilateral uncovertebral hypertrophy. Flattening and partial effacement of the ventral thecal sac with resultant mild spinal stenosis. No significant cord deformity. Foramina are remain patent. C6-C7: Disc desiccation with minimal disc bulge. No canal or foraminal stenosis. C7-T1: Normal interspace. Mild facet hypertrophy. No canal or foraminal stenosis. Visualized upper thoracic spine demonstrates no significant finding. IMPRESSION: 1. Normal  MRI appearance of the cervical spinal cord, with no convincing cord signal abnormality to suggest demyelinating disease. 2. Degenerative disc bulge at C5-6 with resultant mild spinal stenosis. 3. Right-sided uncovertebral hypertrophy at C2-3 with resultant mild right C3 foraminal stenosis. No other significant foraminal encroachment within the cervical spine. Electronically Signed   By: Rise Mu M.D.   On: 01/02/2020 01:29   VAS US CAROTID (at [facility] and [facility] only)  Result Date: 01/02/2020 Carotid Arterial Duplex Study Indications:       Acute to subacute infarction involving the right middle                     cerebellar peduncle and adjacent pons including the                    trigenminal root entry zone. Comparison Study:  No prior study Performing Technologist: Gertie Fey MHA, RDMS, RVT, RDCS  Examination Guidelines: A complete evaluation includes B-mode imaging, spectral Doppler, color Doppler, and power Doppler as needed of all accessible portions of each vessel. Bilateral testing is considered an integral part of a complete examination. Limited examinations for reoccurring indications may be performed as noted.  Right Carotid Findings: +----------+--------+--------+--------+-----------------------+--------+           PSV cm/sEDV cm/sStenosisPlaque Description     Comments +----------+--------+--------+--------+-----------------------+--------+ CCA Prox  95      19              smooth and heterogenous         +----------+--------+--------+--------+-----------------------+--------+ CCA Distal57      13                                              +----------+--------+--------+--------+-----------------------+--------+ ICA Prox  37      10                                              +----------+--------+--------+--------+-----------------------+--------+ ICA Distal110     33                                     tortuous +----------+--------+--------+--------+-----------------------+--------+ ECA       50      8                                               +----------+--------+--------+--------+-----------------------+--------+ +----------+--------+-------+----------------+-------------------+           PSV cm/sEDV cmsDescribe        Arm Pressure (mmHG) +----------+--------+-------+----------------+-------------------+ Subclavian110            Multiphasic, WNL                    +----------+--------+-------+----------------+-------------------+ +---------+--------+--+--------+--+---------+ VertebralPSV cm/s38EDV cm/s11Antegrade  +---------+--------+--+--------+--+---------+  Left Carotid Findings: +----------+--------+--------+--------+-----------------------+--------+           PSV cm/sEDV cm/sStenosisPlaque Description     Comments +----------+--------+--------+--------+-----------------------+--------+ CCA Prox  92      19                                              +----------+--------+--------+--------+-----------------------+--------+  CCA Distal58      17              smooth and heterogenous         +----------+--------+--------+--------+-----------------------+--------+ ICA Prox  38      14                                              +----------+--------+--------+--------+-----------------------+--------+ ICA Distal56      20                                              +----------+--------+--------+--------+-----------------------+--------+ ECA       66      13                                              +----------+--------+--------+--------+-----------------------+--------+ +----------+--------+--------+----------------+-------------------+           PSV cm/sEDV cm/sDescribe        Arm Pressure (mmHG) +----------+--------+--------+----------------+-------------------+ Subclavian101             Multiphasic, WNL                    +----------+--------+--------+----------------+-------------------+ +---------+--------+--+--------+--+---------+ VertebralPSV cm/s64EDV cm/s17Antegrade +---------+--------+--+--------+--+---------+   Summary: Right Carotid: Velocities in the right ICA are consistent with a 1-39% stenosis. Left Carotid: Velocities in the left ICA are consistent with a 1-39% stenosis. Vertebrals:  Bilateral vertebral arteries demonstrate antegrade flow. Subclavians: Normal flow hemodynamics were seen in bilateral subclavian              arteries. *See table(s) above for measurements and observations.  Electronically signed by Sherald Hess MD on 01/02/2020  at 2:45:26 PM.    Final         Scheduled Meds: .  stroke: mapping our early stages of recovery book   Does not apply Once  . aspirin  300 mg Rectal Daily   Or  . aspirin  325 mg Oral Daily  . enoxaparin (LOVENOX) injection  40 mg Subcutaneous Q24H  . insulin aspart  0-15 Units Subcutaneous TID WC  . insulin aspart  0-5 Units Subcutaneous QHS  . potassium chloride  40 mEq Oral BID   Continuous Infusions:   LOS: 1 day    Time spent: 35 minutes    Kaitlyn Sam, MD [facility] 01/02/2020, 3:15 PM     Please page though AMION or Epic secure chat:  For [employer], Higher education careers adviser

## 2020-01-02 NOTE — Evaluation (Signed)
Physical Therapy Evaluation Patient Details Name: Kaitlyn Gray MRN: [id number] DOB: 10/17/1975 Today's Date: 01/02/2020   History of Present Illness  Pt is a 44 y.o. female with PMH of HTN, type 2 DM, polysubstance abuse, presenting to ED with headache, "face twisted", R arm weakness, change in speech, and unsteady gait. 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 Further stroke workup still in progress.   Clinical Impression  Pt admitted with above diagnosis. At the time of PT eval pt was alert and participatory, however physical examination was inconsistent throughout session. With formal examination of strength, pt appears very limited in RUE/RLE, however with functional task, pt was able to ambulate around the room, reach up for the soap at the sink with the RUE, and utilize RUE during ADL task. She demonstrates good problem solving, however with overall significant cognitive deficits (unclear what is baseline vs new deficits). Because a stroke was confirmed, I would like to see the patient get follow-up therapies to maximize functional return and safety at d/c. Pt reports she lives alone, and overall I do not feel she is appropriate for higher intensity therapy which is available at [facility]. I ultimately feel a group home setting may be the most appropriate, however after discussion with the CSW and OT, it was decided to pursue SNF level services first. Pt currently with functional limitations due to the deficits listed below (see PT Problem List). Pt will benefit from skilled PT to increase their independence and safety with mobility to allow discharge to the venue listed below.       Follow Up Recommendations SNF;Supervision/Assistance - 24 hour    Equipment Recommendations  Other (comment) (TBD by next venue of care. )    Recommendations for Other Services       Precautions / Restrictions Precautions Precautions:  Fall Restrictions Weight Bearing Restrictions: No      Mobility  Bed Mobility Overal bed mobility: Needs Assistance Bed Mobility: Supine to Sit;Sit to Supine     Supine to sit: Supervision Sit to supine: Min guard   General bed mobility comments: Increased time and cues for compensatory techniques. Pt letting RLE hang off EOB upon return to supine, and required multimodal cues for pt to bring it back up in the bed.   Transfers Overall transfer level: Needs assistance Equipment used: 1 person hand held assist Transfers: Sit to/from Stand Sit to Stand: +2 safety/equipment;Min assist         General transfer comment: Assist for power-up to full stand and gain/maintain balance  Ambulation/Gait Ambulation/Gait assistance: Min assist;+2 safety/equipment Gait Distance (Feet): 15 Feet Assistive device: 1 person hand held assist Gait Pattern/deviations: Decreased stride length;Shuffle;Narrow base of support Gait velocity: Decreased Gait velocity interpretation: <1.31 ft/sec, indicative of household ambulator General Gait Details: Pt ambulated in room only. Initially reports "I can't walk" but when cued to go to the sink for hand hygiene she was able to ambulate there and back with min assist - +2 available for safety.   Stairs            Wheelchair Mobility    Modified Rankin (Stroke Patients Only)       Balance Overall balance assessment: Mild deficits observed, not formally tested  Pertinent Vitals/Pain Pain Assessment: No/denies pain    Home Living Family/patient expects to be discharged to:: Private residence Living Arrangements: Alone Available Help at Discharge: Friend(s);Available PRN/intermittently Type of Home: Apartment Home Access: Stairs to enter   Entrance Stairs-Number of Steps: 5 Home Layout: Two level Home Equipment: None Additional Comments: 4 cats at home    Prior Function Level  of Independence: Independent         Comments: pt limited historian     Hand Dominance   Dominant Hand: Right    Extremity/Trunk Assessment   Upper Extremity Assessment Upper Extremity Assessment: RUE deficits/detail RUE Deficits / Details: decreased strength and coordination in R UE. Not formally tested due to pt cognition. RUE Coordination: decreased fine motor;decreased gross motor    Lower Extremity Assessment Lower Extremity Assessment: RLE deficits/detail RLE Deficits / Details: Decreased strength. Pt barely moving RLE during formal assessment however was able to stand, walk to the sink, and maintain standing for ~10 minutes. Attempted to assess RLE again at end of session and pt again barely moving it.  RLE Coordination: decreased gross motor;decreased fine motor    Cervical / Trunk Assessment Cervical / Trunk Assessment: Normal  Communication   Communication: Expressive difficulties (slurred speech)  Cognition Arousal/Alertness: Awake/alert Behavior During Therapy: Flat affect Overall Cognitive Status: Impaired/Different from baseline Area of Impairment: Safety/judgement;Following commands;Memory;Attention;Awareness                   Current Attention Level: Sustained Memory: Decreased recall of precautions;Decreased short-term memory Following Commands: Follows one step commands consistently;Follows one step commands with increased time Safety/Judgement: Decreased awareness of safety Awareness: Intellectual   General Comments: Unsure of pt's baseline - no family or friends present during eval. Slow processing and decreased awareness with soiled linens upon arrival. Pt did show good problem solving during ADL task however intermittently using R hand and with overall decreased safety awareness.      General Comments General comments (skin integrity, edema, etc.): Upon arrival pt in soiled linens - cleaned and return to bed with clean sheets    Exercises      Assessment/Plan    PT Assessment Patient needs continued PT services  PT Problem List Decreased strength;Decreased activity tolerance;Decreased balance;Decreased mobility;Decreased coordination;Decreased cognition;Decreased knowledge of use of DME;Decreased safety awareness;Decreased knowledge of precautions       PT Treatment Interventions DME instruction;Gait training;Functional mobility training;Therapeutic activities;Therapeutic exercise;Balance training;Neuromuscular re-education;Cognitive remediation;Patient/family education    PT Goals (Current goals can be found in the Care Plan section)  Acute Rehab PT Goals Patient Stated Goal: none stated PT Goal Formulation: With patient Time For Goal Achievement: 01/16/20 Potential to Achieve Goals: Fair    Frequency Min 3X/week   Barriers to discharge Decreased caregiver support Lives alone without consistent support    Co-evaluation PT/OT/SLP Co-Evaluation/Treatment: Yes Reason for Co-Treatment: Complexity of the patient's impairments (multi-system involvement);Necessary to address cognition/behavior during functional activity;For patient/therapist safety;To address functional/ADL transfers PT goals addressed during session: Mobility/safety with mobility;Balance OT goals addressed during session: ADL's and self-care;Proper use of Adaptive equipment and DME       AM-PAC PT "6 Clicks" Mobility  Outcome Measure Help needed turning from your back to your side while in a flat bed without using bedrails?: None Help needed moving from lying on your back to sitting on the side of a flat bed without using bedrails?: A Little Help needed moving to and from a bed to a chair (including a wheelchair)?: A Little Help needed standing  up from a chair using your arms (e.g., wheelchair or bedside chair)?: A Little Help needed to walk in hospital room?: A Little Help needed climbing 3-5 steps with a railing? : A Lot 6 Click Score: 18    End  of Session Equipment Utilized During Treatment: Gait belt Activity Tolerance: Other (comment) (Limited by behavior/cognition) Patient left: in bed;with call bell/phone within reach;with bed alarm set Nurse Communication: Mobility status PT Visit Diagnosis: Unsteadiness on feet (R26.81);Hemiplegia and hemiparesis Hemiplegia - Right/Left: Right Hemiplegia - dominant/non-dominant: Dominant Hemiplegia - caused by: Cerebral infarction    Time: 1308-6578 PT Time Calculation (min) (ACUTE ONLY): 31 min   Charges:   PT Evaluation $PT Eval Moderate Complexity: 1 Mod          Conni Slipper, PT, DPT Acute Rehabilitation Services Pager: [phone] Office: [phone]   Marylynn Pearson 01/02/2020, 2:50 PM

## 2020-01-02 NOTE — Progress Notes (Signed)
  Echocardiogram 2D echo has been performed.  Pieter Partridge 01/02/2020, 11:59 AM

## 2020-01-02 NOTE — Evaluation (Signed)
Occupational Therapy Evaluation Patient Details Name: Kaitlyn Gray MRN: [id number] DOB: [birth year] Today's Date: 01/02/2020    History of Present Illness Pt is a 44 y.o. female with PMH of HTN, type 2 DM, MDD, BPD, polysubstance abuse, acute encephalopathy, and hypokalemia presenting to ED with headache, "face twisted", R arm weakness, change in speech, and unsteady gait x 1 week. Stroke work up still in progress.    Clinical Impression   PTA pt reports being independent and living alone in her apartment. Pt was admitted for above and treated for problem list below (see OT Problem List). Pt presents with decreased cognition but unsure of baseline. Slow processing and decreased awareness with soiled linens upon arrival and decreased safety awareness. Requires Min A - Mod A +2 for ADLs with cues for safety and sequencing due to R sided weakness/incoordination, decreased balance, and decreased cognition. Requires Mod A +2 with transfers and ambulation with multimodal cues for safety and sequencing. Pt presents with R sided (dominant) weakness and incoordination and L-sided gaze preference - to be assessed further next session. Potential inattention of RUE with spontaneous use for stabilization but relying on L UE for functional tasks. Believe pt would benefit from skilled OT services acutely and at the SNF level with 24/7 assist to increase safety and independence with ADLs.    Follow Up Recommendations  SNF;Supervision/Assistance - 24 hour    Equipment Recommendations  Other (comment) (TBD at next venue of care)       Precautions / Restrictions Precautions Precautions: Fall      Mobility Bed Mobility Overal bed mobility: Needs Assistance Bed Mobility: Supine to Sit;Sit to Supine     Supine to sit: Supervision Sit to supine: Supervision   General bed mobility comments: supervision for safety and cueing for compensatory techniques  Transfers Overall transfer level: Needs  assistance Equipment used: 1 person hand held assist Transfers: Sit to/from Stand Sit to Stand: Mod assist;+2 safety/equipment         General transfer comment: Mod A for safety with balance    Balance Overall balance assessment: Mild deficits observed, not formally tested                                         ADL either performed or assessed with clinical judgement   ADL Overall ADL's : Needs assistance/impaired     Grooming: Standing;Minimal assistance Grooming Details (indicate cue type and reason): Min A for steady with balance Upper Body Bathing: Moderate assistance;Sitting;Cueing for sequencing;Cueing for compensatory techniques Upper Body Bathing Details (indicate cue type and reason): Mod A for decreased functional use of R UE Lower Body Bathing: Sitting/lateral leans;Sit to/from stand;Cueing for compensatory techniques;Cueing for sequencing;Minimal assistance Lower Body Bathing Details (indicate cue type and reason): Min A with sit to stand Upper Body Dressing : Sitting;Moderate assistance Upper Body Dressing Details (indicate cue type and reason): Mod A for donning new gown Lower Body Dressing: Moderate assistance;Sit to/from stand Lower Body Dressing Details (indicate cue type and reason): Mod A with decreased coordination on R side Toilet Transfer: Moderate assistance;+2 for safety/equipment;Ambulation;Regular Teacher, adult education Details (indicate cue type and reason): Mod A +2 for sit<>stand Toileting- Clothing Manipulation and Hygiene: Moderate assistance;Sit to/from stand;+2 for safety/equipment Toileting - Clothing Manipulation Details (indicate cue type and reason): Mod A +2 for sit<>stand   Tub/Shower Transfer Details (indicate cue type and reason): deferred due to  pt safety Functional mobility during ADLs: Moderate assistance;+2 for safety/equipment General ADL Comments: Decreased strength, coordination, and cognition     Vision    Vision Assessment?: Vision impaired- to be further tested in functional context Additional Comments: Noted L sided gaze preference - able to scan for objects during functional activities            Pertinent Vitals/Pain Pain Assessment: No/denies pain     Hand Dominance Right   Extremity/Trunk Assessment Upper Extremity Assessment Upper Extremity Assessment: RUE deficits/detail RUE Deficits / Details: decreased strength and coordination in R UE. Not formally tested due to pt cognition. 2/5 grip strength and 2-/5 shoulder flexion RUE Coordination: decreased fine motor;decreased gross motor   Lower Extremity Assessment Lower Extremity Assessment: Defer to PT evaluation   Cervical / Trunk Assessment Cervical / Trunk Assessment: Normal   Communication Communication Communication: Expressive difficulties (slurred speech)   Cognition Arousal/Alertness: Awake/alert Behavior During Therapy: Flat affect Overall Cognitive Status: Impaired/Different from baseline Area of Impairment: Safety/judgement;Following commands;Memory;Attention;Awareness                   Current Attention Level: Sustained Memory: Decreased recall of precautions;Decreased short-term memory Following Commands: Follows one step commands consistently Safety/Judgement: Decreased awareness of safety Awareness: Intellectual   General Comments: Unsure of pt's baseline - no family or friends present during eval. Slow processing and decreased awareness with soiled linens upon arrival and decreased safety awareness   General Comments  Upon arrival pt in soiled linens - cleaned and return to bed with clean sheets            Home Living Family/patient expects to be discharged to:: Private residence Living Arrangements: Alone Available Help at Discharge: Available PRN/intermittently;Friend(s);Family Type of Home: Apartment Home Access: Stairs to enter Secretary/administrator of Steps: 5   Home Layout: Two  level Alternate Level Stairs-Number of Steps: flight   Bathroom Shower/Tub: Chief Strategy Officer: Standard     Home Equipment: None   Additional Comments: 4 cats at home      Prior Functioning/Environment Level of Independence: Independent        Comments: pt limited historian        OT Problem List: Decreased strength;Decreased range of motion;Decreased activity tolerance;Impaired balance (sitting and/or standing);Impaired vision/perception;Decreased coordination;Decreased cognition;Decreased safety awareness;Decreased knowledge of use of DME or AE;Decreased knowledge of precautions;Impaired sensation;Impaired UE functional use      OT Treatment/Interventions: Self-care/ADL training;Neuromuscular education;Energy conservation;DME and/or AE instruction;Therapeutic activities;Manual therapy;Cognitive remediation/compensation;Visual/perceptual remediation/compensation;Patient/family education;Balance training    OT Goals(Current goals can be found in the care plan section) Acute Rehab OT Goals Patient Stated Goal: none stated OT Goal Formulation: With patient Time For Goal Achievement: 01/16/20 Potential to Achieve Goals: Good  OT Frequency: Min 2X/week   Barriers to D/C: Decreased caregiver support  Pt reports living alone with boyfriend and family that visits       Co-evaluation PT/OT/SLP Co-Evaluation/Treatment: Yes Reason for Co-Treatment: Complexity of the patient's impairments (multi-system involvement);Necessary to address cognition/behavior during functional activity;For patient/therapist safety;To address functional/ADL transfers   OT goals addressed during session: ADL's and self-care;Proper use of Adaptive equipment and DME      AM-PAC OT "6 Clicks" Daily Activity     Outcome Measure Help from another person eating meals?: A Little Help from another person taking care of personal grooming?: A Little Help from another person toileting, which  includes using toliet, bedpan, or urinal?: A Lot Help from another person bathing (including washing, rinsing,  drying)?: A Lot Help from another person to put on and taking off regular upper body clothing?: A Lot Help from another person to put on and taking off regular lower body clothing?: A Lot 6 Click Score: 14   End of Session Equipment Utilized During Treatment: Gait belt Nurse Communication: Mobility status  Activity Tolerance: Patient tolerated treatment well Patient left: in bed;with call bell/phone within reach;with bed alarm set  OT Visit Diagnosis: Unsteadiness on feet (R26.81);Other abnormalities of gait and mobility (R26.89);Muscle weakness (generalized) (M62.81);Other symptoms and signs involving the nervous system (R29.898);Other symptoms and signs involving cognitive function;Low vision, both eyes (H54.2)                Time: 8182-9937 OT Time Calculation (min): 30 min Charges:  OT General Charges $OT Visit: 1 Visit OT Evaluation $OT Eval Moderate Complexity: 1 Mod  Keyly Baldonado/OTS  Reana Chacko 01/02/2020, 12:46 PM

## 2020-01-02 NOTE — Progress Notes (Signed)
SLP Cancellation Note  Patient Details Name: Kaitlyn Gray MRN: [id number] DOB: 1976/05/04   Cancelled treatment:       Reason Eval/Treat Not Completed: Patient at procedure or test/unavailable.  Pt was OTF at this time.  SLP will f/u for cognitive-linguistic evaluation as schedule allows.    Shanon Rosser Teyton Pattillo 01/02/2020, 12:12 PM

## 2020-01-02 NOTE — Evaluation (Signed)
Speech Language Pathology Evaluation Patient Details Name: Kaitlyn Gray MRN: [id number] DOB: 1976/02/23 Today's Date: 01/02/2020 Time: 6606-3016 SLP Time Calculation (min) (ACUTE ONLY): 24 min  Problem List:  Patient Active Problem List   Diagnosis Date Noted  . Stroke (HCC) 01/01/2020  . Acute encephalopathy 05/12/2018  . Diabetes mellitus without complication (HCC) 12/06/2017  . Hypokalemia 12/06/2017  . Hypertension 12/06/2017  . 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 Bipolar disorder (HCC) 07/19/2017  . Polysubstance abuse (HCC) 07/19/2017  . Chest pain 07/19/2017  . Cocaine use disorder, severe, dependence (HCC) 03/24/2015  . Cannabis use disorder, moderate, dependence (HCC) 03/24/2015  . MDD (major depressive disorder), recurrent severe, 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   Past Medical History:  Past Medical History:  Diagnosis Date  . Bipolar 1 disorder (HCC)   . 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 Hypertension    Past Surgical History:  Past Surgical History:  Procedure Laterality Date  . CESAREAN SECTION     HPI:  Kaitlyn Gray is a 44 y.o. female with medical history significant of bipolar disorder, diabetes mellitus type 2, hypertension, polysubstance abuse, noncompliance to medications presented with multiple complaints including headache, dizziness, unsteady gait, weakness in her right arm and leg, facial droop and slurred speech.  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  She was encountered awake/alert with meal tray  in front of her.  Pt was oriented to herself, but she was not oriented to year, date, day, month, place, state, or city.  It was difficult to establish a baseline for the pt secondary to her reporting conflicting information and no family or friends present to assist with providing information.  She initially stated that she lives alone and that she was independent with IADLs including medication and financial management.  She later stated that she lived with a friend.  She completed the [facility] SLUMS examination and she scored overall 10/30, indicating a moderate-severe cognitive deficit.  She exhibited deficits in orientation, short-term memory, numeric problem solving, safety judgement, and attention.  Expressive and receptive language appeared to be grossly functional, but speech intelligibility was noted to be decreased.  Her speech was approximately 90% intelligible at the word level and 80% intelligible in conversation.  Oral mechanism examination revealed a R facial droop, reduced facial ROM on the R, and a mild lingual deviation to the R, possibly indicative of lingual weakness.  Recommend 24/7 supervision for safety and assistance with all IADLs at time of discharge.  SLP will f/u acutely targeting cognitive-linguistic deficits.      SLP Assessment  SLP Recommendation/Assessment: Patient needs continued Speech Lanaguage Pathology Services SLP Visit Diagnosis: Cognitive communication deficit (R41.841);Dysarthria and anarthria (R47.1)    Follow Up Recommendations  Skilled Nursing facility;24 hour supervision/assistance    Frequency and Duration min 2x/week  2 weeks      SLP Evaluation Cognition  Overall Cognitive Status: Impaired/Different from baseline Arousal/Alertness: Awake/alert Orientation Level: Oriented to person;Disoriented to place;Disoriented to time;Disoriented to situation Attention: Focused;Sustained Focused Attention: Impaired Focused Attention Impairment: Verbal  basic Sustained Attention: Impaired Sustained Attention Impairment: Verbal basic Memory: Impaired  Memory Impairment: Decreased short term memory Decreased Short Term Memory: Verbal basic Awareness: Impaired Awareness Impairment: Emergent impairment Problem Solving: Impaired Problem Solving Impairment: Verbal basic;Verbal complex Executive Function: Organizing Organizing: Appears intact Behaviors: Impulsive Safety/Judgment: Impaired       Comprehension  Auditory Comprehension Overall Auditory Comprehension: Appears within functional limits for tasks assessed    Expression Expression Primary Mode of Expression: Verbal Verbal Expression Overall Verbal Expression: Appears within functional limits for tasks assessed Written Expression Dominant Hand: Right Written Expression: Unable to assess (comment) (Pt reported that she is unable to write )   Oral / Motor  Oral Motor/Sensory Function Overall Oral Motor/Sensory Function: Moderate impairment Facial ROM: Reduced right Facial Symmetry: Abnormal symmetry right Lingual ROM: Within Functional Limits Lingual Symmetry: Abnormal symmetry right Motor Speech Overall Motor Speech: Impaired Respiration: Within functional limits Phonation: Normal Resonance: Within functional limits Articulation: Impaired Level of Impairment: Word Intelligibility: Intelligibility reduced Word: 75-100% accurate Phrase: 75-100% accurate Sentence: 75-100% accurate Conversation: 75-100% accurate  GO                   Villa Herb M.S., CCC-SLP Acute Rehabilitation Services Office: [phone]   Shanon Rosser Heloise Gordan 01/02/2020, 1:49 PM

## 2020-01-03 ENCOUNTER — Inpatient Hospital Stay (HOSPITAL_COMMUNITY): Payer: Medicaid Other

## 2020-01-03 LAB — PROTIME-INR
INR: 1 (ref 0.8–1.2)
Prothrombin Time: 12.5 seconds (ref 11.4–15.2)

## 2020-01-03 LAB — GLUCOSE, CAPILLARY
Glucose-Capillary: 158 mg/dL — ABNORMAL HIGH (ref 70–99)
Glucose-Capillary: 124 mg/dL — ABNORMAL HIGH (ref 70–99)
Glucose-Capillary: 132 mg/dL — ABNORMAL HIGH (ref 70–99)

## 2020-01-03 LAB — CSF CELL COUNT WITH DIFFERENTIAL
RBC Count, CSF: 1 /mm3 — ABNORMAL HIGH
Tube #: 3
WBC, CSF: 4 /mm3 (ref 0–5)

## 2020-01-03 LAB — IRON AND TIBC
Iron: 37 ug/dL (ref 28–170)
Saturation Ratios: 11 % (ref 10.4–31.8)
TIBC: 340 ug/dL (ref 250–450)
UIBC: 303 ug/dL

## 2020-01-03 LAB — GLUCOSE, CSF: Glucose, CSF: 88 mg/dL — ABNORMAL HIGH (ref 40–70)

## 2020-01-03 LAB — FERRITIN: Ferritin: 6 ng/mL — ABNORMAL LOW (ref 11–307)

## 2020-01-03 LAB — APTT: aPTT: 28 seconds (ref 24–36)

## 2020-01-03 LAB — RPR: RPR Ser Ql: NONREACTIVE

## 2020-01-03 LAB — PROTEIN, CSF: Total  Protein, CSF: 60 mg/dL — ABNORMAL HIGH (ref 15–45)

## 2020-01-03 IMAGING — RF DG SPINAL PUNCT LUMBAR DIAG WITH FL CT GUIDANCE
1 series · 1 of 1 positions shown · non-contrast
Comparison: none

CLINICAL DATA: Encephalopathy

[Series 1: cp_standard · 0.19mm/px · 1 of 1 slices shown]
[im 1/1]
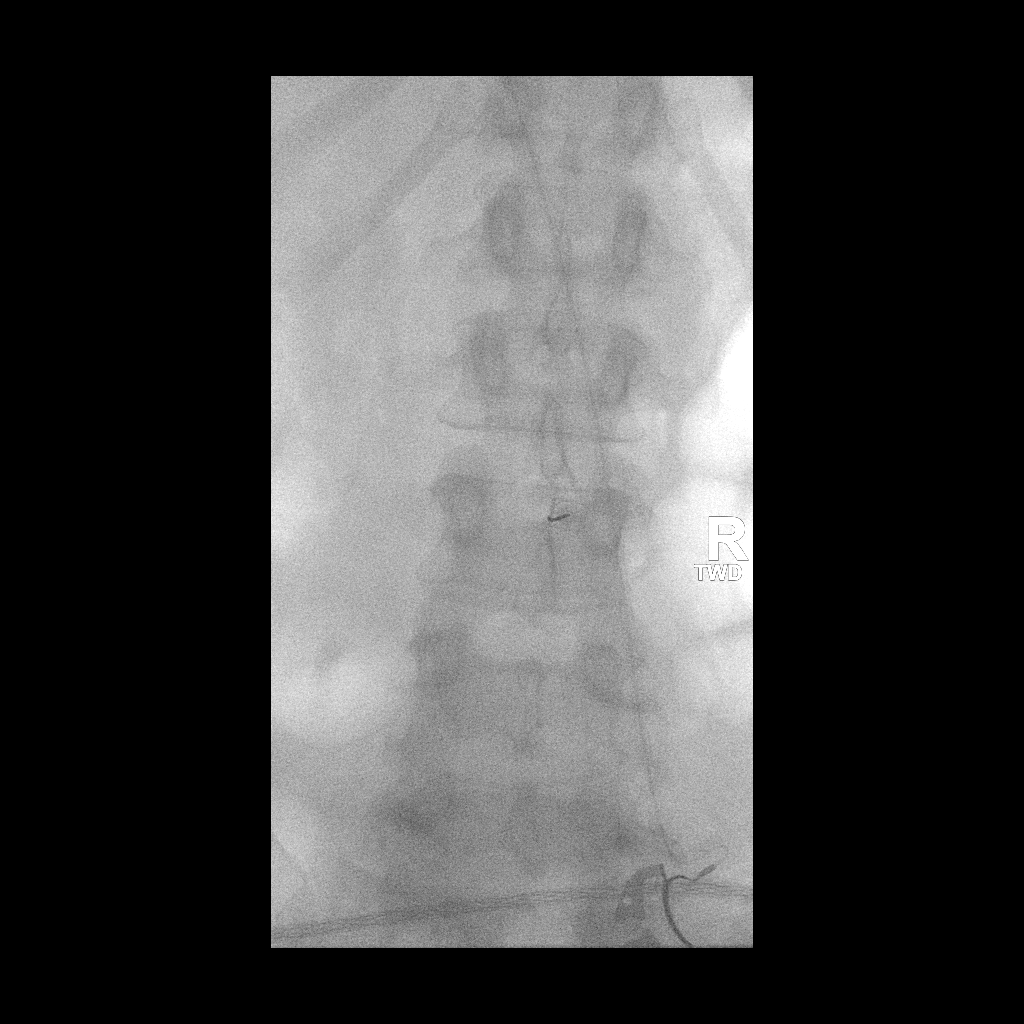

[1 of 1 positions shown; findings below may reference images not displayed]

EXAM:
DIAGNOSTIC LUMBAR PUNCTURE UNDER FLUOROSCOPIC GUIDANCE

FLUOROSCOPY TIME:  Fluoroscopy Time:  6 seconds

Number of Acquired Spot Images: 1

PROCEDURE:
Informed consent was obtained from the patient prior to the
procedure, including potential complications of headache, allergy,
and pain. With the patient prone, the lower back was prepped with
Betadine. 1% Lidocaine was used for local anesthesia. Lumbar
puncture was performed at the L2-L3 level using a 22 gauge needle
with return of clear CSF. 9 ml of CSF were obtained for laboratory
studies. The patient tolerated the procedure well and there were no
apparent complications.
IMPRESSION: Technically successful fluoroscopic guided lumbar puncture.

## 2020-01-03 MED ORDER — FERROUS SULFATE 325 (65 FE) MG PO TABS: 325.0000 mg | ORAL_TABLET | ORAL | Status: DC

## 2020-01-03 MED ORDER — LIDOCAINE HCL (PF) 1 % IJ SOLN
5.0000 mL | Freq: Once | INTRAMUSCULAR | Status: AC
  Administered 2020-01-03: 5 mL via INTRADERMAL

## 2020-01-03 MED ORDER — DIAZEPAM 5 MG PO TABS
5.0000 mg | ORAL_TABLET | Freq: Once | ORAL | Status: AC
  Administered 2020-01-03: 5 mg via ORAL

## 2020-01-03 MED ORDER — SODIUM CHLORIDE 0.9 % IV SOLN
510.0000 mg | Freq: Once | INTRAVENOUS | Status: AC
  Administered 2020-01-03: 510 mg via INTRAVENOUS
  Filled 2020-01-03: qty 17

## 2020-01-03 MED ORDER — HYDRALAZINE HCL 20 MG/ML IJ SOLN
10.0000 mg | Freq: Four times a day (QID) | INTRAMUSCULAR | Status: DC | PRN
  Administered 2020-01-03: 10 mg via INTRAVENOUS
  Filled 2020-01-03: qty 1

## 2020-01-03 NOTE — Progress Notes (Signed)
Responded to consult for PIV. Pt currently out of room. 

## 2020-01-03 NOTE — Procedures (Addendum)
.  Lumbar Puncture Procedure Note  Kaitlyn BALEY  [id number]  06-Sep-1975  Date:01/03/20  Time:11:14 AM   Provider Performing:Ijeoma Loor Allena Katz   Procedure: Lumbar Puncture (40347)  Indication(s) Rule out meningitis   Consent Unable to obtain consent due to inability to find a medical decision maker for patient.  All reasonable efforts were made.  Another independent medical provider, Dr. Maryfrances Bunnell, confirmed the benefits of this procedure outweigh the risks.  Anesthesia Topical only with 1% lidocaine    Time Out Verified patient identification, verified procedure, site/side was marked, verified correct patient position, special equipment/implants available, medications/allergies/relevant history reviewed, required imaging and test results available.   Sterile Technique Maximal sterile technique including sterile barrier drape, hand hygiene, sterile gown, sterile gloves, mask, hair covering.    Procedure Description Using palpation, approximate location of L2-L3 space identified.   Lidocaine used to anesthetize skin and subcutaneous tissue overlying this area.  A 20g spinal needle was then used to access the subarachnoid space. Opening pressure:Not obtained. Closing pressure:Not obtained. 9 mL CSF obtained.  Complications/Tolerance  None; patient tolerated the procedure well.   EBL Minimal   Specimen(s) CSF

## 2020-01-03 NOTE — NC FL2 (Signed)
[place] MEDICAID FL2 LEVEL OF CARE SCREENING TOOL     IDENTIFICATION  Patient Name: Kaitlyn Gray Birthdate: [birth year] Sex: female Admission Date (Current Location): 01/01/2020  [facility] and [place] Number:  Producer, television/film/video and Address:  [facility], [address], [place], [place] [address]      Provider Number: [id number]  Attending Physician Name and Address:  Alberteen Sam, *  Relative Name and Phone Number:       Current Level of Care: Hospital Recommended Level of Care: Skilled Nursing Facility Prior Approval Number:    Date Approved/Denied:   PASRR Number: pending  Discharge Plan: SNF    Current Diagnoses: Patient Active Problem List   Diagnosis Date Noted  . Stroke (HCC) 01/01/2020  . Acute encephalopathy 05/12/2018  . Diabetes mellitus without complication (HCC) 12/06/2017  . Hypokalemia 12/06/2017  . Hypertension 12/06/2017  . 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 Bipolar disorder (HCC) 07/19/2017  . Polysubstance abuse (HCC) 07/19/2017  . Chest pain 07/19/2017  . Cocaine use disorder, severe, dependence (HCC) 03/24/2015  . Cannabis use disorder, moderate, dependence (HCC) 03/24/2015  . MDD (major depressive disorder), recurrent severe, 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    Orientation RESPIRATION BLADDER Height & Weight     Time, Self, Situation, Place  Normal Incontinent Weight: 190 lb (86.2 kg) Height:  5\' 3"  (160 cm)  BEHAVIORAL SYMPTOMS/MOOD NEUROLOGICAL BOWEL NUTRITION STATUS      Incontinent Diet (See discharge summary)  AMBULATORY STATUS COMMUNICATION OF NEEDS Skin   Extensive Assist Verbally Normal                       Personal Care Assistance Level of Assistance  Bathing, Feeding, Dressing Bathing Assistance: Maximum assistance Feeding assistance: Limited assistance Dressing Assistance: Maximum assistance     Functional  Limitations Info  Sight, Hearing, Speech Sight Info: Adequate Hearing Info: Adequate Speech Info: Adequate    SPECIAL CARE FACTORS FREQUENCY  PT (By licensed PT), OT (By licensed OT)     PT Frequency: 5x a week OT Frequency: 5x a week            Contractures Contractures Info: Not present    Additional Factors Info  Code Status, Allergies Code Status Info: Full Allergies Info: latex, hydrocodone           Current Medications (01/03/2020):  This is the current hospital active medication list Current Facility-Administered Medications  Medication Dose Route Frequency Provider Last Rate Last Admin  .  stroke: mapping our early stages of recovery book   Does not apply Once 01/05/2020, MD      . acetaminophen (TYLENOL) tablet 650 mg  650 mg Oral Q4H PRN Glade Lloyd, MD   650 mg at 01/03/20 01/05/20   Or  . acetaminophen (TYLENOL) 160 MG/5ML solution 650 mg  650 mg Per Tube Q4H PRN 2841, MD       Or  . acetaminophen (TYLENOL) suppository 650 mg  650 mg Rectal Q4H PRN Alekh, Kshitiz, MD      . amLODipine (NORVASC) tablet 5 mg  5 mg Oral Daily Danford, Glade Lloyd, MD   5 mg at 01/03/20 0928  . aspirin suppository 300 mg  300 mg Rectal Daily Alekh, Kshitiz, MD       Or  . aspirin tablet 325 mg  325 mg Oral Daily 01/05/20, Kshitiz, MD   325 mg at  01/03/20 4496  . atorvastatin (LIPITOR) tablet 40 mg  40 mg Oral QHS Danford, Christopher P, MD      . diazepam (VALIUM) tablet 5 mg  5 mg Oral Once PRN Danford, Earl Lites, MD      . enoxaparin (LOVENOX) injection 40 mg  40 mg Subcutaneous Q24H Danford, Earl Lites, MD      . Melene Muller ON 01/05/2020] ferrous sulfate tablet 325 mg  325 mg Oral QODAY Danford, Christopher P, MD      . hydrALAZINE (APRESOLINE) injection 10 mg  10 mg Intravenous Q6H PRN Shalhoub, Deno Lunger, MD   10 mg at 01/03/20 0537  . insulin aspart (novoLOG) injection 0-15 Units  0-15 Units Subcutaneous TID WC Glade Lloyd, MD   2 Units at 01/03/20 1252  .  insulin aspart (novoLOG) injection 0-5 Units  0-5 Units Subcutaneous QHS Glade Lloyd, MD   2 Units at 01/01/20 2203  . senna-docusate (Senokot-S) tablet 1 tablet  1 tablet Oral QHS PRN Glade Lloyd, MD         Discharge Medications: Please see discharge summary for a list of discharge medications.  Relevant Imaging Results:  Relevant Lab Results:   Additional Information SSN: [id number]  Jimmy Picket, [place]

## 2020-01-03 NOTE — Progress Notes (Signed)
PROGRESS NOTE    Kaitlyn Gray  ZOX:[id number] DOB: Jul 06, 1975 DOA: 01/01/2020 PCP: Lavinia Sharps, NP      Brief Narrative:  Kaitlyn Gray is a 44 y.o. F with Bipolar disorder, DM, polysubstance abuse, medication nonadherence, and HTN who presented with ataxia and right sided hemiparesis for over a week.  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        Assessment & Plan:  Right hemiparesis and ataxia MRI showed patchy enhancement of the cerebellum and adjacent pons, consistent with stroke versus multiple sclerosis.  Neurology were consulted.    MRI c-spine unremarkable.  MR brain with contrast showed incomplete peripheral enhancement of right middle cerebellar peduncle/pontine lesion.  CTA head and neck showed diffuse atherosclerosis.  Echo unremarkable.  LP recommended and pending -Consult to neurology, appreciate recommendations  -Follow LP -Continue aspirin -Continue new statin, high intensity -Continue new antihypertensive   Acute metabolic encephalopathy Mild, patient is somewhat disoriented to place, but easily redirectable, cooperative.  I suspect this is from her stroke, superimposed on some possible baseline cognitive impairment (no collateral is available, despite efforts to reach family).  Bipolar disorder Not active, not currently on treatment  Type 2 diabetes, poorly controlled wtihout complication A1c close to rnomal.  Glucoses here controlled with little insulin correction -Continue SS correction insulin  Hypertension BP still elevated -Continue amlodipine  Morbid obesity BMI 33 with DM and HTN  Iron deficiency anemia No baseline Hgb stable -Give Feraheme -Start oral iron      Disposition: Status is: Inpatient  Remains inpatient appropriate because:Altered mental status   Dispo:  Patient From: Home  Planned Disposition: To be determined  Expected discharge date:  01/04/20  Medically stable for discharge: No         Patietn admitted with ataxia, hemiparesis and confusion.  She remains confused pleasantly.  Imaging today indeterminate, will need LP.  Pending results, will need Neurology follow up and rehab placement.          MDM: The below labs and imaging reports reviewed and summarized above.  Medication management as above.    DVT prophylaxis: enoxaparin (LOVENOX) injection 40 mg Start: 01/02/20 1400  Code Status: FULL Family Communication: Attempted to aunt, no answer.  Multiple calls to family without success.       Consultants:   Neurology    Procedures:   7/18 MRI brain -- patchy restricted diffusion in right middle cerebellar peduncle and adjacent pons  7/18 MRI cervical spine -- normal c-spine  7/19 carotid ultrasound - no stenosis  7/19 echocardiogram -- normal EF, no cardiac source  7/19 MRI brain with contrast --peripheral enhancement of restricted diffusion area noted above  7/19 CT angiogram of the head and neck -- diffuse atherosclersosi  Antimicrobials:      Culture data:              Subjective: Patient remains confused, with right-sided weakness and ataxia.  No headache fever, vomiting, loss of consciousness, seizure.     Objective: Vitals:   01/02/20 2017 01/02/20 2329 01/03/20 0350 01/03/20 0627  BP: (!) 172/105 (!) 169/96 (!) 181/95 (!) 166/79  Pulse: 68 84 63   Resp: (!) 22 17 19 20   Temp: 97.7 F (36.5 C) 98.4 F (36.9 C) 98.5 F (36.9 C)   TempSrc: Oral Oral Oral   SpO2: 100% 99% 100%   Weight:      Height:  Intake/Output Summary (Last 24 hours) at 01/03/2020 1115 Last data filed at 01/03/2020 0350 Gross per 24 hour  Intake --  Output 900 ml  Net -900 ml   Filed Weights   01/01/20 0822  Weight: 86.2 kg    Examination: General appearance: Obese adult female, lying in bed, pleasant.     HEENT:    Skin:  Cardiac: RRR, no murmurs, no lower extremity  edema Respiratory: Respiratory rate normal, lungs clear without rales or wheezes Abdomen: Abdomen soft no tenderness palpation or guarding. MSK:  Neuro: Awake and alert, extraocular movements intact, right sided weakness and ataxia persist, speech fluent. Psych: Sensorium intact and responding to questions, attention blunted, affect normal.  Is calm but confused.      Data Reviewed: I have personally reviewed following labs and imaging studies:  CBC: Recent Labs  Lab 01/01/20 1332 01/02/20 0504  WBC 6.1 4.8  NEUTROABS 3.5 2.4  HGB 8.5* 8.0*  HCT 29.3* 27.4*  MCV 79.8* 79.0*  PLT 280 278   Basic Metabolic Panel: Recent Labs  Lab 01/01/20 1332 01/02/20 0504  NA 143 139  K 3.2* 3.2*  CL 106 106  CO2 27 26  GLUCOSE 181* 130*  BUN 13 9  CREATININE 1.03* 0.96  CALCIUM 8.9 8.4*  MG  --  1.7   GFR: Estimated Creatinine Clearance: 77 mL/min (by C-G formula based on SCr of 0.96 mg/dL). Liver Function Tests: Recent Labs  Lab 01/01/20 1332 01/02/20 0504  AST 15 12*  ALT 13 11  ALKPHOS 63 58  BILITOT 0.5 0.5  PROT 6.4* 6.1*  ALBUMIN 3.2* 2.9*   No results for input(s): LIPASE, AMYLASE in the last 168 hours. No results for input(s): AMMONIA in the last 168 hours. Coagulation Profile: Recent Labs  Lab 01/03/20 0355  INR 1.0   Cardiac Enzymes: No results for input(s): CKTOTAL, CKMB, CKMBINDEX, TROPONINI in the last 168 hours. BNP (last 3 results) No results for input(s): PROBNP in the last 8760 hours. HbA1C: Recent Labs    01/02/20 0504  HGBA1C 7.5*   CBG: Recent Labs  Lab 01/02/20 0701 01/02/20 1340 01/02/20 1745 01/02/20 2125 01/03/20 0625  GLUCAP 141* 154* 128* 162* 158*   Lipid Profile: Recent Labs    01/02/20 0504  CHOL 181  HDL 48  LDLCALC 122*  TRIG 53  CHOLHDL 3.8   Thyroid Function Tests: No results for input(s): TSH, T4TOTAL, FREET4, T3FREE, THYROIDAB in the last 72 hours. Anemia Panel: Recent Labs    01/03/20 0355  FERRITIN  6*  TIBC 340  IRON 37   Urine analysis:    Component Value Date/Time   COLORURINE YELLOW 01/01/2020 1803   APPEARANCEUR CLOUDY (A) 01/01/2020 1803   LABSPEC 1.016 01/01/2020 1803   PHURINE 8.0 01/01/2020 1803   GLUCOSEU 150 (A) 01/01/2020 1803   HGBUR NEGATIVE 01/01/2020 1803   BILIRUBINUR NEGATIVE 01/01/2020 1803   KETONESUR NEGATIVE 01/01/2020 1803   PROTEINUR NEGATIVE 01/01/2020 1803   UROBILINOGEN 0.2 10/06/2014 0856   NITRITE NEGATIVE 01/01/2020 1803   LEUKOCYTESUR NEGATIVE 01/01/2020 1803   Sepsis Labs: @LABRCNTIP (procalcitonin:4,lacticacidven:4)  ) Recent Results (from the past 240 hour(s))  SARS Coronavirus 2 by RT PCR (hospital order, performed in [facility] hospital lab) Nasopharyngeal Nasopharyngeal Swab     Status: None   Collection Time: 01/01/20  6:03 PM   Specimen: Nasopharyngeal Swab  Result Value Ref Range Status   SARS Coronavirus 2 NEGATIVE NEGATIVE Final    Comment: (NOTE) SARS-CoV-2 target nucleic acids are  NOT DETECTED.  The SARS-CoV-2 RNA is generally detectable in upper and lower respiratory specimens during the acute phase of infection. The lowest concentration of SARS-CoV-2 viral copies this assay can detect is 250 copies / mL. A negative result does not preclude SARS-CoV-2 infection and should not be used as the sole basis for treatment or other patient management decisions.  A negative result may occur with improper specimen collection / handling, submission of specimen other than nasopharyngeal swab, presence of viral mutation(s) within the areas targeted by this assay, and inadequate number of viral copies (<250 copies / mL). A negative result must be combined with clinical observations, patient history, and epidemiological information.  Fact Sheet for Patients:   BoilerBrush.com.cy  Fact Sheet for Healthcare Providers: https://pope.com/  This test is not yet approved or  cleared by the  [place] FDA and has been authorized for detection and/or diagnosis of SARS-CoV-2 by FDA under an Emergency Use Authorization (EUA).  This EUA will remain in effect (meaning this test can be used) for the duration of the COVID-19 declaration under Section 564(b)(1) of the Act, 21 U.S.C. section 360bbb-3(b)(1), unless the authorization is terminated or revoked sooner.  Performed at [facility], [address], [place], [place] [address]   Culture, blood (Routine X 2) w Reflex to ID Panel     Status: None (Preliminary result)   Collection Time: 01/02/20  9:33 PM   Specimen: BLOOD  Result Value Ref Range Status   Specimen Description BLOOD LEFT ANTECUBITAL  Final   Special Requests   Final    BOTTLES DRAWN AEROBIC AND ANAEROBIC Blood Culture results may not be optimal due to an inadequate volume of blood received in culture bottles   Culture   Final    NO GROWTH < 12 HOURS Performed at [facility], [address] [place], [place] [address]    Report Status PENDING  Incomplete  Culture, blood (Routine X 2) w Reflex to ID Panel     Status: None (Preliminary result)   Collection Time: 01/02/20  9:41 PM   Specimen: BLOOD RIGHT HAND  Result Value Ref Range Status   Specimen Description BLOOD RIGHT HAND  Final   Special Requests   Final    BOTTLES DRAWN AEROBIC ONLY Blood Culture results may not be optimal due to an inadequate volume of blood received in culture bottles   Culture   Final    NO GROWTH < 12 HOURS Performed at [facility], [address], [place], [place] [address]    Report Status PENDING  Incomplete         Radiology Studies: CT ANGIO HEAD W OR WO CONTRAST  Result Date: 01/02/2020 CLINICAL DATA:  Vasculitis, CNS additional provided: History of diabetes mellitus type 2, hypertension. Patient reports headache, dizziness, unsteady gait, weakness in right arm. EXAM: CT ANGIOGRAPHY HEAD AND NECK TECHNIQUE: Multidetector CT imaging of the head  and neck was performed using the standard protocol during bolus administration of intravenous contrast. Multiplanar CT image reconstructions and MIPs were obtained to evaluate the vascular anatomy. Carotid stenosis measurements (when applicable) are obtained utilizing NASCET criteria, using the distal internal carotid diameter as the denominator. CONTRAST:  75mL OMNIPAQUE IOHEXOL 350 MG/ML SOLN COMPARISON:  Contrast-enhanced brain MRI 01/02/2020, noncontrast brain MRI 01/01/2020, noncontrast head CT 12/16/2019. FINDINGS: CT HEAD FINDINGS Brain: Cerebral volume is normal for age. A lesion within the posterolateral right pons/right middle cerebellar peduncle is better appreciated on same-day brain  MRI and MRI performed 01/01/2020. Redemonstrated chronic cortically based infarct within the right occipital lobe. Stable patchy hypoattenuation within the cerebral white matter which is advanced for age and nonspecific, but most commonly seen on the basis of chronic small vessel ischemia. There is no acute intracranial hemorrhage. No acute demarcated cortical infarct is identified. No extra-axial fluid collection. No evidence of intracranial mass. No midline shift. Partially empty sella turcica. Vascular: Reported below. Skull: Normal. Negative for fracture or focal lesion. Sinuses: Chronic deformity of the left nasal bone. Small left maxillary sinus mucous retention cyst. No significant mastoid effusion. Orbits: Visualized orbits show no acute finding. Chronic deformity of the right lamina papyracea. Review of the MIP images confirms the above findings CTA NECK FINDINGS Aortic arch: Standard aortic branching. The visualized aortic arch is unremarkable. No hemodynamically significant innominate or proximal subclavian artery stenosis. Right carotid system: CCA and ICA patent within the neck without significant stenosis (50% or greater). Tortuosity and partially retropharyngeal course of the cervical right ICA. Mild focal  narrowing of the proximal to mid right ICA (series 10, image 159). Left carotid system: CCA and ICA patent within the neck without stenosis. Retropharyngeal course of the survey ICA. No significant atherosclerotic disease. Vertebral arteries: Codominant and patent within the neck bilaterally without significant stenosis. Skeleton: No acute bony abnormality or aggressive osseous lesion. Cervical spondylosis with prominent multilevel ventral osteophytes. Other neck: No neck mass or cervical lymphadenopathy. Thyroid unremarkable. Upper chest: No consolidation within the imaged lung apices. Review of the MIP images confirms the above findings CTA HEAD FINDINGS Anterior circulation: The intracranial internal carotid arteries are patent. Mixed plaque within both vessels. Moderate stenosis of the cavernous segment on the left. No significant stenosis of the intracranial right ICA. The M1 middle cerebral arteries are patent without significant stenosis. No M2 proximal branch occlusion is identified. Moderate/severe focal stenosis within a proximal M2 left MCA branch (series 15, image 45). There is irregularity of the M2 and more distal MCA branch vessels bilaterally. The left anterior cerebral artery is developmentally diminutive, but patent. The dominant right anterior cerebral artery is patent without significant proximal stenosis. No intracranial aneurysm is identified. Posterior circulation: The intracranial vertebral arteries are patent without significant stenosis, as is the basilar artery. There is a high-grade focal stenosis within the proximal P2 right posterior cerebral artery (series 15, image 25). Additionally, the distal right posterior cerebral artery is poorly delineated suggestive of high-grade stenosis or occlusion. The left posterior cerebral artery is patent without high-grade proximal stenosis. High-grade focal stenosis within the P4 left posterior cerebral artery (series 14, image 28). Posterior  communicating arteries are hypoplastic or absent bilaterally. Venous sinuses: Within limitations of contrast timing, no convincing thrombus. Anatomic variants: As described Review of the MIP images confirms the above findings IMPRESSION: CT head: 1. A lesion within the posterolateral right pons/right middle cerebellar peduncle was better appreciated on MRI examinations performed 01/02/2020 and 01/01/2020. Please refer to these prior studies for further description. 2. No evidence of interval intracranial abnormality. 3. Redemonstrated small chronic cortically based right occipital lobe infarct 4. Stable age advanced chronic cerebral white matter disease which is nonspecific, but most commonly seen on the basis of chronic small vessel ischemia. Sequela of demyelinating disease is also considered. 5. Tiny left maxillary sinus mucous retention cyst. CTA neck: The common carotid, internal carotid and vertebral arteries are patent within the neck without hemodynamically significant stenosis. Mild nonspecific focal narrowing of the proximal to mid cervical right ICA. CTA head:  1. Mixed plaque within both intracranial internal carotid arteries. Moderate stenosis of the cavernous left ICA. 2. Additional intracranial stenoses as follows, which may be secondary to atherosclerotic disease. It is difficult to exclude vasculitis on the basis of this examination. 3. Moderate/severe focal stenosis within a proximal M2 left MCA branch vessel. Additional irregularity of M2 and more distal MCA branches bilaterally. 4. High-grade focal stenosis within the proximal P2 right posterior cerebral artery. Additionally, the distal right posterior cerebral artery is poorly delineated suggestive of high-grade stenosis or occlusion. 5. High-grade focal stenosis within the P4 left posterior cerebral artery. Electronically Signed   By: Jackey Loge DO   On: 01/02/2020 16:03   DG Chest 2 View  Result Date: 01/01/2020 CLINICAL DATA:  Altered  mental status, weakness and facial droop, possible CVA EXAM: CHEST - 2 VIEW COMPARISON:  Radiograph 12/16/2019, CT 03/24/2018 FINDINGS: Low lung volumes with ill-defined opacities in the lung bases possibly reflecting atelectasis though consolidation or sequela of aspiration could have a similar appearance in the setting of altered mental status given some mild basilar airways thickening as well. Mild cardiomegaly is similar to prior counting for differences in technique. No pneumothorax or visible effusion. No acute osseous or soft tissue abnormality. Telemetry leads overlie the chest. IMPRESSION: Low lung volumes with ill-defined opacities in the lung bases, favor atelectasis though consolidation or aspiration could present similarly. Electronically Signed   By: Kreg Shropshire M.D.   On: 01/01/2020 19:18   CT ANGIO NECK W OR WO CONTRAST  Result Date: 01/02/2020 CLINICAL DATA:  Vasculitis, CNS additional provided: History of diabetes mellitus type 2, hypertension. Patient reports headache, dizziness, unsteady gait, weakness in right arm. EXAM: CT ANGIOGRAPHY HEAD AND NECK TECHNIQUE: Multidetector CT imaging of the head and neck was performed using the standard protocol during bolus administration of intravenous contrast. Multiplanar CT image reconstructions and MIPs were obtained to evaluate the vascular anatomy. Carotid stenosis measurements (when applicable) are obtained utilizing NASCET criteria, using the distal internal carotid diameter as the denominator. CONTRAST:  75mL OMNIPAQUE IOHEXOL 350 MG/ML SOLN COMPARISON:  Contrast-enhanced brain MRI 01/02/2020, noncontrast brain MRI 01/01/2020, noncontrast head CT 12/16/2019. FINDINGS: CT HEAD FINDINGS Brain: Cerebral volume is normal for age. A lesion within the posterolateral right pons/right middle cerebellar peduncle is better appreciated on same-day brain MRI and MRI performed 01/01/2020. Redemonstrated chronic cortically based infarct within the right  occipital lobe. Stable patchy hypoattenuation within the cerebral white matter which is advanced for age and nonspecific, but most commonly seen on the basis of chronic small vessel ischemia. There is no acute intracranial hemorrhage. No acute demarcated cortical infarct is identified. No extra-axial fluid collection. No evidence of intracranial mass. No midline shift. Partially empty sella turcica. Vascular: Reported below. Skull: Normal. Negative for fracture or focal lesion. Sinuses: Chronic deformity of the left nasal bone. Small left maxillary sinus mucous retention cyst. No significant mastoid effusion. Orbits: Visualized orbits show no acute finding. Chronic deformity of the right lamina papyracea. Review of the MIP images confirms the above findings CTA NECK FINDINGS Aortic arch: Standard aortic branching. The visualized aortic arch is unremarkable. No hemodynamically significant innominate or proximal subclavian artery stenosis. Right carotid system: CCA and ICA patent within the neck without significant stenosis (50% or greater). Tortuosity and partially retropharyngeal course of the cervical right ICA. Mild focal narrowing of the proximal to mid right ICA (series 10, image 159). Left carotid system: CCA and ICA patent within the neck without stenosis. Retropharyngeal course of  the survey ICA. No significant atherosclerotic disease. Vertebral arteries: Codominant and patent within the neck bilaterally without significant stenosis. Skeleton: No acute bony abnormality or aggressive osseous lesion. Cervical spondylosis with prominent multilevel ventral osteophytes. Other neck: No neck mass or cervical lymphadenopathy. Thyroid unremarkable. Upper chest: No consolidation within the imaged lung apices. Review of the MIP images confirms the above findings CTA HEAD FINDINGS Anterior circulation: The intracranial internal carotid arteries are patent. Mixed plaque within both vessels. Moderate stenosis of the  cavernous segment on the left. No significant stenosis of the intracranial right ICA. The M1 middle cerebral arteries are patent without significant stenosis. No M2 proximal branch occlusion is identified. Moderate/severe focal stenosis within a proximal M2 left MCA branch (series 15, image 45). There is irregularity of the M2 and more distal MCA branch vessels bilaterally. The left anterior cerebral artery is developmentally diminutive, but patent. The dominant right anterior cerebral artery is patent without significant proximal stenosis. No intracranial aneurysm is identified. Posterior circulation: The intracranial vertebral arteries are patent without significant stenosis, as is the basilar artery. There is a high-grade focal stenosis within the proximal P2 right posterior cerebral artery (series 15, image 25). Additionally, the distal right posterior cerebral artery is poorly delineated suggestive of high-grade stenosis or occlusion. The left posterior cerebral artery is patent without high-grade proximal stenosis. High-grade focal stenosis within the P4 left posterior cerebral artery (series 14, image 28). Posterior communicating arteries are hypoplastic or absent bilaterally. Venous sinuses: Within limitations of contrast timing, no convincing thrombus. Anatomic variants: As described Review of the MIP images confirms the above findings IMPRESSION: CT head: 1. A lesion within the posterolateral right pons/right middle cerebellar peduncle was better appreciated on MRI examinations performed 01/02/2020 and 01/01/2020. Please refer to these prior studies for further description. 2. No evidence of interval intracranial abnormality. 3. Redemonstrated small chronic cortically based right occipital lobe infarct 4. Stable age advanced chronic cerebral white matter disease which is nonspecific, but most commonly seen on the basis of chronic small vessel ischemia. Sequela of demyelinating disease is also considered.  5. Tiny left maxillary sinus mucous retention cyst. CTA neck: The common carotid, internal carotid and vertebral arteries are patent within the neck without hemodynamically significant stenosis. Mild nonspecific focal narrowing of the proximal to mid cervical right ICA. CTA head: 1. Mixed plaque within both intracranial internal carotid arteries. Moderate stenosis of the cavernous left ICA. 2. Additional intracranial stenoses as follows, which may be secondary to atherosclerotic disease. It is difficult to exclude vasculitis on the basis of this examination. 3. Moderate/severe focal stenosis within a proximal M2 left MCA branch vessel. Additional irregularity of M2 and more distal MCA branches bilaterally. 4. High-grade focal stenosis within the proximal P2 right posterior cerebral artery. Additionally, the distal right posterior cerebral artery is poorly delineated suggestive of high-grade stenosis or occlusion. 5. High-grade focal stenosis within the P4 left posterior cerebral artery. Electronically Signed   By: Jackey Loge DO   On: 01/02/2020 16:03   MR BRAIN WO CONTRAST  Result Date: 01/01/2020 CLINICAL DATA:  Possible stroke EXAM: MRI HEAD WITHOUT CONTRAST TECHNIQUE: Multiplanar, multiecho pulse sequences of the brain and surrounding structures were obtained without intravenous contrast. COMPARISON:  None. FINDINGS: Brain: There is mildly reduced diffusion along the right middle cerebellar peduncle and adjacent pons including the trigeminal root entry zone. Foci of susceptibility are present in the thalamus bilaterally compatible with chronic microhemorrhages or less likely mineralization. Lateral ventricles are mildly prominent likely on an  ex vacuo basis. There is patchy and confluent T2 hyperintensity in the supratentorial and pontine white matter, which is nonspecific but may reflect moderate chronic microvascular ischemic changes, greater than expected for age. There is no intracranial mass or  significant mass effect. There is no hydrocephalus or extra-axial fluid collection. Vascular: Major vessel flow voids at the skull base are preserved. Skull and upper cervical spine: Normal marrow signal is preserved. Sinuses/Orbits: Minor mucosal thickening. There is chronic deformity of the right medial orbital wall, which may be posttraumatic. Other: Sella is unremarkable.  Mastoid air cells are clear. 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 A demyelinating lesion commonly involves this region but is considered less likely given appearance of white matter disease. Age advanced chronic microvascular ischemic changes. Electronically Signed   By: Guadlupe Spanish M.D.   On: 01/01/2020 16:58   MR BRAIN W CONTRAST  Result Date: 01/02/2020 CLINICAL DATA:  Stroke, follow-up. EXAM: MRI HEAD WITH CONTRAST TECHNIQUE: Multiplanar, multiecho pulse sequences of the brain and surrounding structures were obtained with intravenous contrast. CONTRAST:  7.74mL GADAVIST GADOBUTROL 1 MMOL/ML IV SOLN COMPARISON:  01/01/2020 MRI head. FINDINGS: Brain: Mild diffuse parenchymal volume loss with ex vacuo dilatation. Site of prior restricted diffusion involving the right middle cerebellar peduncle, pons and right trigeminal root entry zone demonstrates incomplete peripheral enhancement. Chronic microvascular ischemic changes. There are no other foci of abnormal intracranial enhancement. No midline shift or extra-axial fluid collection. No mass lesion. Vascular: Normal flow voids. Skull and upper cervical spine: Normal marrow signal. Sinuses/Orbits: Chronic medial right orbital wall posttraumatic deformity. Clear paranasal sinuses. No mastoid effusion. Other: None. IMPRESSION: Incomplete peripheral enhancement at site of prior right middle cerebellar peduncle/pontine restricted diffusion involving the trigeminal  root entry zone is highly suspicious for demyelinating lesion however cannot exclude acute to subacute infarct. No additional sites of abnormal intracranial enhancement. Electronically Signed   By: Stana Bunting M.D.   On: 01/02/2020 14:47   MR CERVICAL SPINE WO CONTRAST  Result Date: 01/02/2020 CLINICAL DATA:  Initial evaluation for multiple sclerosis, new event. EXAM: MRI CERVICAL SPINE WITHOUT CONTRAST TECHNIQUE: Multiplanar, multisequence MR imaging of the cervical spine was performed. No intravenous contrast was administered. COMPARISON:  Prior brain MRI from 01/01/2020. FINDINGS: Alignment: Straightening of the normal cervical lordosis. No listhesis or subluxation. Vertebrae: Vertebral body height maintained without evidence for acute or chronic fracture. Bone marrow signal intensity within normal limits. No discrete or worrisome osseous lesions. No abnormal marrow edema. Cord: Evaluation of the cervical spinal cord mildly limited by motion. No convincing cord signal abnormality to suggest demyelinating disease. Cord caliber and morphology within normal limits. Posterior Fossa, vertebral arteries, paraspinal tissues: T2 signal abnormality involving the right middle cerebellar peduncle again noted, better seen on prior brain MRI. Additional nonspecific T2/STIR signal intensity noted within the pons. Craniocervical junction within normal limits. Paraspinous and prevertebral soft tissues are normal. Normal flow voids seen within the vertebral arteries bilaterally. Disc levels: C2-C3: Mild right-sided uncovertebral hypertrophy without significant disc bulge. No significant spinal stenosis. Mild right C3 foraminal stenosis. No left foraminal encroachment. C3-C4: Mild bilateral uncovertebral hypertrophy without significant disc bulge. No canal or foraminal stenosis. C4-C5:  Unremarkable. C5-C6: Diffuse disc bulge with bilateral uncovertebral hypertrophy. Flattening and partial effacement of the ventral  thecal sac with resultant mild spinal stenosis. No significant cord deformity. Foramina are remain patent. C6-C7: Disc desiccation with minimal disc bulge. No  canal or foraminal stenosis. C7-T1: Normal interspace. Mild facet hypertrophy. No canal or foraminal stenosis. Visualized upper thoracic spine demonstrates no significant finding. IMPRESSION: 1. Normal MRI appearance of the cervical spinal cord, with no convincing cord signal abnormality to suggest demyelinating disease. 2. Degenerative disc bulge at C5-6 with resultant mild spinal stenosis. 3. Right-sided uncovertebral hypertrophy at C2-3 with resultant mild right C3 foraminal stenosis. No other significant foraminal encroachment within the cervical spine. Electronically Signed   By: Rise Mu M.D.   On: 01/02/2020 01:29   ECHOCARDIOGRAM COMPLETE  Result Date: 01/02/2020    ECHOCARDIOGRAM REPORT   Patient Name:   Kaitlyn Gray Date of Exam: 01/02/2020 Medical Rec #:  [id number]         Height:       63.0 in Accession #:    [id number]        Weight:       190.0 lb Date of Birth:  07/13/1975         BSA:          1.892 m Patient Age:    44 years          BP:           169/83 mmHg Patient Gender: F                 HR:           65 bpm. Exam Location:  Inpatient Procedure: 2D Echo, Cardiac Doppler and Color Doppler Indications:    CVA  History:        Patient has prior history of Echocardiogram examinations, most                 recent 12/07/2017. Risk Factors:Hypertension, Diabetes and                 Current Smoker. Polysubstance abuse, Noncompliance.  Sonographer:    Lavenia Atlas Referring Phys: [id number] [facility] IMPRESSIONS  1. Left ventricular ejection fraction, by estimation, is 55 to 60%. The left ventricle has normal function. The left ventricle has no regional wall motion abnormalities. There is moderate concentric left ventricular hypertrophy. Left ventricular diastolic parameters are indeterminate.  2. Right ventricular  systolic function is normal. The right ventricular size is normal. There is normal pulmonary artery systolic pressure.  3. Left atrial size was moderately dilated.  4. The mitral valve is normal in structure. No evidence of mitral valve regurgitation. No evidence of mitral stenosis.  5. The aortic valve is grossly normal. Aortic valve regurgitation is mild.  6. The inferior vena cava is normal in size with greater than 50% respiratory variability, suggesting right atrial pressure of 3 mmHg. Comparison(s): No significant change from prior study. Conclusion(s)/Recommendation(s): No intracardiac source of embolism detected on this transthoracic study. A transesophageal echocardiogram is recommended to exclude cardiac source of embolism if clinically indicated. FINDINGS  Left Ventricle: Left ventricular ejection fraction, by estimation, is 55 to 60%. The left ventricle has normal function. The left ventricle has no regional wall motion abnormalities. The left ventricular internal cavity size was normal in size. There is  moderate concentric left ventricular hypertrophy. Left ventricular diastolic parameters are indeterminate. Right Ventricle: The right ventricular size is normal. No increase in right ventricular wall thickness. Right ventricular systolic function is normal. There is normal pulmonary artery systolic pressure. The tricuspid regurgitant velocity is 2.47 m/s, and  with an assumed right atrial pressure of 3 mmHg, the estimated right ventricular systolic pressure is  27.4 mmHg. Left Atrium: Left atrial size was moderately dilated. Right Atrium: Right atrial size was normal in size. Pericardium: There is no evidence of pericardial effusion. Mitral Valve: The mitral valve is normal in structure. No evidence of mitral valve regurgitation. No evidence of mitral valve stenosis. Tricuspid Valve: The tricuspid valve is normal in structure. Tricuspid valve regurgitation is trivial. No evidence of tricuspid stenosis.  Aortic Valve: The aortic valve is grossly normal. Aortic valve regurgitation is mild. Pulmonic Valve: The pulmonic valve was grossly normal. Pulmonic valve regurgitation is not visualized. No evidence of pulmonic stenosis. Aorta: The aortic root, ascending aorta and aortic arch are all structurally normal, with no evidence of dilitation or obstruction. Venous: The inferior vena cava is normal in size with greater than 50% respiratory variability, suggesting right atrial pressure of 3 mmHg. IAS/Shunts: The atrial septum is grossly normal.  LEFT VENTRICLE PLAX 2D LVIDd:         4.70 cm  Diastology LVIDs:         3.30 cm  LV e' lateral:   7.07 cm/s LV PW:         1.40 cm  LV E/e' lateral: 8.0 LV IVS:        1.40 cm  LV e' medial:    6.64 cm/s LVOT diam:     2.20 cm  LV E/e' medial:  8.5 LV SV:         99 LV SV Index:   52 LVOT Area:     3.80 cm  RIGHT VENTRICLE RV Basal diam:  2.80 cm RV S prime:     10.40 cm/s TAPSE (M-mode): 3.1 cm LEFT ATRIUM              Index       RIGHT ATRIUM           Index LA diam:        3.60 cm  1.90 cm/m  RA Area:     13.50 cm LA Vol (A2C):   73.9 ml  39.06 ml/m RA Volume:   30.90 ml  16.33 ml/m LA Vol (A4C):   101.0 ml 53.38 ml/m LA Biplane Vol: 91.5 ml  48.36 ml/m  AORTIC VALVE LVOT Vmax:   105.00 cm/s LVOT Vmean:  80.400 cm/s LVOT VTI:    0.260 m  AORTA Ao Root diam: 2.70 cm MITRAL VALVE               TRICUSPID VALVE MV Area (PHT): 3.60 cm    TR Peak grad:   24.4 mmHg MV Decel Time: 211 msec    TR Vmax:        247.00 cm/s MV E velocity: 56.60 cm/s MV A velocity: 69.00 cm/s  SHUNTS MV E/A ratio:  0.82        Systemic VTI:  0.26 m                            Systemic Diam: 2.20 cm Jodelle Red MD Electronically signed by Jodelle Red MD Signature Date/Time: 01/02/2020/4:51:44 PM    Final    VAS US CAROTID (at [facility] and [facility] only)  Result Date: 01/02/2020 Carotid Arterial Duplex Study Indications:       Acute to subacute infarction involving the right middle                     cerebellar peduncle and adjacent pons including the  trigenminal root entry zone. Comparison Study:  No prior study Performing Technologist: Gertie Fey MHA, RDMS, RVT, RDCS  Examination Guidelines: A complete evaluation includes B-mode imaging, spectral Doppler, color Doppler, and power Doppler as needed of all accessible portions of each vessel. Bilateral testing is considered an integral part of a complete examination. Limited examinations for reoccurring indications may be performed as noted.  Right Carotid Findings: +----------+--------+--------+--------+-----------------------+--------+           PSV cm/sEDV cm/sStenosisPlaque Description     Comments +----------+--------+--------+--------+-----------------------+--------+ CCA Prox  95      19              smooth and heterogenous         +----------+--------+--------+--------+-----------------------+--------+ CCA Distal57      13                                              +----------+--------+--------+--------+-----------------------+--------+ ICA Prox  37      10                                              +----------+--------+--------+--------+-----------------------+--------+ ICA Distal110     33                                     tortuous +----------+--------+--------+--------+-----------------------+--------+ ECA       50      8                                               +----------+--------+--------+--------+-----------------------+--------+ +----------+--------+-------+----------------+-------------------+           PSV cm/sEDV cmsDescribe        Arm Pressure (mmHG) +----------+--------+-------+----------------+-------------------+ Subclavian110            Multiphasic, WNL                    +----------+--------+-------+----------------+-------------------+ +---------+--------+--+--------+--+---------+ VertebralPSV cm/s38EDV cm/s11Antegrade  +---------+--------+--+--------+--+---------+  Left Carotid Findings: +----------+--------+--------+--------+-----------------------+--------+           PSV cm/sEDV cm/sStenosisPlaque Description     Comments +----------+--------+--------+--------+-----------------------+--------+ CCA Prox  92      19                                              +----------+--------+--------+--------+-----------------------+--------+ CCA Distal58      17              smooth and heterogenous         +----------+--------+--------+--------+-----------------------+--------+ ICA Prox  38      14                                              +----------+--------+--------+--------+-----------------------+--------+ ICA Distal56      20                                              +----------+--------+--------+--------+-----------------------+--------+  ECA       66      13                                              +----------+--------+--------+--------+-----------------------+--------+ +----------+--------+--------+----------------+-------------------+           PSV cm/sEDV cm/sDescribe        Arm Pressure (mmHG) +----------+--------+--------+----------------+-------------------+ Subclavian101             Multiphasic, WNL                    +----------+--------+--------+----------------+-------------------+ +---------+--------+--+--------+--+---------+ VertebralPSV cm/s64EDV cm/s17Antegrade +---------+--------+--+--------+--+---------+   Summary: Right Carotid: Velocities in the right ICA are consistent with a 1-39% stenosis. Left Carotid: Velocities in the left ICA are consistent with a 1-39% stenosis. Vertebrals:  Bilateral vertebral arteries demonstrate antegrade flow. Subclavians: Normal flow hemodynamics were seen in bilateral subclavian              arteries. *See table(s) above for measurements and observations.  Electronically signed by Sherald Hess MD on 01/02/2020  at 2:45:26 PM.    Final         Scheduled Meds: .  stroke: mapping our early stages of recovery book   Does not apply Once  . amLODipine  5 mg Oral Daily  . aspirin  300 mg Rectal Daily   Or  . aspirin  325 mg Oral Daily  . atorvastatin  40 mg Oral QHS  . enoxaparin (LOVENOX) injection  40 mg Subcutaneous Q24H  . [START ON 01/05/2020] ferrous sulfate  325 mg Oral QODAY  . insulin aspart  0-15 Units Subcutaneous TID WC  . insulin aspart  0-5 Units Subcutaneous QHS  . lidocaine (PF)  5 mL Intradermal Once   Continuous Infusions:   LOS: 2 days    Time spent: 35 minutes    Alberteen Sam, MD [facility] 01/03/2020, 11:15 AM     Please page though AMION or Epic secure chat:  For [employer], Higher education careers adviser

## 2020-01-03 NOTE — TOC Initial Note (Addendum)
Transition of Care [facility]) - Initial/Assessment Note    Patient Details  Name: Kaitlyn Gray MRN: [id number] Date of Birth: 03/16/1976  [facility]) CM/SW Contact:    Emeterio Reeve, [place] Phone Number: 01/03/2020, 2:51 PM  Clinical Narrative:                  CSW met with pt at bedside. CSW introduced self and explained her role at the hospital.  PT stated that PTA she was living at home alone with her cats. Pt states she was independent and completed all ADL's.   CSW reviewed PT/OT reccs of SNF. PT agreed to snf and stated she hasn't been to a snf in the past. Pt stated that she is concerned about her who will feed her cats. CSW encouraged pt to keep making phone calls to car for animals. CSW also explained that she would not be able to use cocaine at any facility, pt states shes not using anymore.   Pts Passr number is pending and clinicals have been faxed.   CSW will continue to follow.   Expected Discharge Plan: Skilled Nursing Facility Barriers to Discharge: Continued Medical Work up, SNF Pending bed offer   Patient Goals and CMS Choice Patient states their goals for this hospitalization and ongoing recovery are:: To go back home CMS Medicare.gov Compare Post Acute Care list provided to:: Patient Choice offered to / list presented to : Patient  Expected Discharge Plan and Services Expected Discharge Plan: [place]       Living arrangements for the past 2 months: Apartment                                      Prior Living Arrangements/Services Living arrangements for the past 2 months: Apartment Lives with:: Self Patient language and need for interpreter reviewed:: Yes Do you feel safe going back to the place where you live?: Yes      Need for Family Participation in Patient Care: Yes (Comment) Care giver support system in place?: Yes (comment) Current home services: DME Criminal Activity/Legal Involvement Pertinent to Current  Situation/Hospitalization: No - Comment as needed  Activities of Daily Living Home Assistive Devices/Equipment: None ADL Screening (condition at time of admission) Patient's cognitive ability adequate to safely complete daily activities?: Yes Is the patient deaf or have difficulty hearing?: No Does the patient have difficulty seeing, even when wearing glasses/contacts?: No Does the patient have difficulty concentrating, remembering, or making decisions?: No Patient able to express need for assistance with ADLs?: Yes Does the patient have difficulty dressing or bathing?: No Independently performs ADLs?: Yes (appropriate for developmental age) Does the patient have difficulty walking or climbing stairs?: No Weakness of Legs: Right Weakness of Arms/Hands: Right  Permission Sought/Granted Permission sought to share information with : Facility Art therapist granted to share information with : Yes, Verbal Permission Granted     Permission granted to share info w AGENCY: snf        Emotional Assessment Appearance:: Appears older than stated age Attitude/Demeanor/Rapport: Engaged, Lethargic Affect (typically observed): Stable Orientation: : Oriented to Self, Oriented to Place, Oriented to  Time, Oriented to Situation Alcohol / Substance Use: Illicit Drugs Psych Involvement: No (comment)  Admission diagnosis:  Dyspnea [R06.00] Stroke [facility]) [I63.9] Patient Active Problem List   Diagnosis Date Noted  . Stroke ([place]) 01/01/2020  . Acute encephalopathy 05/12/2018  .  Diabetes mellitus without complication ([place]) 16/03/9603  . Hypokalemia 12/06/2017  . Hypertension 12/06/2017  . 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 Bipolar disorder ([place]) 07/19/2017  . Polysubstance abuse ([place]) 07/19/2017  . Chest pain 07/19/2017  . Cocaine use disorder, severe, dependence ([place]) 03/24/2015  . Cannabis use disorder, moderate,  dependence ([place]) 03/24/2015  . MDD (major depressive disorder), recurrent severe, without psychosis ([place]) 03/24/2015   PCP:  Marliss Coots, NP Pharmacy:   [facility] [place], [place] [address] Phone: [phone] Fax: [phone]     Social Determinants of Health (SDOH) Interventions    Readmission Risk Interventions No flowsheet data found.  Emeterio Reeve, Latanya Presser, [place] Social Worker [phone]

## 2020-01-03 NOTE — Plan of Care (Signed)
  Problem: Education: Goal: Knowledge of General Education information will improve Description Including pain rating scale, medication(s)/side effects and non-pharmacologic comfort measures Outcome: Progressing   Problem: Clinical Measurements: Goal: Will remain free from infection Outcome: Progressing Goal: Respiratory complications will improve Outcome: Progressing Goal: Cardiovascular complication will be avoided Outcome: Progressing   
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
## 2020-01-03 NOTE — Progress Notes (Addendum)
NEUROLOGY PROGRESS NOTE  Subjective: Patient is awake, sitting up eating lunch.  Having no complaints.  Exam: Vitals:   01/03/20 0820 01/03/20 1142  BP: (!) 175/89 (!) 165/93  Pulse: 71 72  Resp: 20 20  Temp: 98.3 F (36.8 C) 98.5 F (36.9 C)  SpO2: 100% 100%     Neuro:  Mental Status: Patient is alert, oriented, follows instructions.  Slightly dysarthric voice.  No aphasia. Cranial Nerves: II: Right-sided field cut III,IV, VI: ptosis not present, still having some difficulty moving eyes right of midline but again will briefly be able to track and cross midline.   V,VII: Continues to have a right facial droop Motor: Moving right upper extremity 3/5.  Right lower extremity 2/5.  Left upper extremity 5/5 and left lower extremity 4/5 Sensory: Sensation is decreased on the right upper and lower extremity Deep Tendon Reflexes: 1+ on right upper and lower extremity, 2+ left upper and lower extremity Plantars: Right: downgoing   Left: downgoing Cerebellar: Normal finger-to-nose and heel-to-shin on the left  Medications:  Scheduled: .  stroke: mapping our early stages of recovery book   Does not apply Once  . amLODipine  5 mg Oral Daily  . aspirin  300 mg Rectal Daily   Or  . aspirin  325 mg Oral Daily  . atorvastatin  40 mg Oral QHS  . enoxaparin (LOVENOX) injection  40 mg Subcutaneous Q24H  . [START ON 01/05/2020] ferrous sulfate  325 mg Oral QODAY  . insulin aspart  0-15 Units Subcutaneous TID WC  . insulin aspart  0-5 Units Subcutaneous QHS   Continuous:   Pertinent Labs/Diagnostics: -ESR 13 -CRP 0.7 -Hepatitis C negative -RPR negative -SSA, SSB, ANA, c-ANCA, p-ANCA, rheumatoid factor, antiphospholipid antibody pending -A1c 7.5 -Fasting lipid LDL 122 -CSF protein 60 -CSF glucose 88 -CSF culture no growth -CSF IgG, oligoclonal bands, cell count with differential, VR DL pending   MR BRAIN W CONTRAST  Result Date: 01/02/2020 CLINICAL DATA:  Stroke, follow-up.  EXAM: MRI HEAD WITH CONTRAST TECHNIQUE: Multiplanar, multiecho pulse sequences of the brain and surrounding structures were obtained with intravenous contrast. CONTRAST:  7.57m GADAVIST GADOBUTROL 1 MMOL/ML IV SOLN COMPARISON:  01/01/2020 MRI head. FINDINGS: Brain: Mild diffuse parenchymal volume loss with ex vacuo dilatation. Site of prior restricted diffusion involving the right middle cerebellar peduncle, pons and right trigeminal root entry zone demonstrates incomplete peripheral enhancement. Chronic microvascular ischemic changes. There are no other foci of abnormal intracranial enhancement. No midline shift or extra-axial fluid collection. No mass lesion. Vascular: Normal flow voids. Skull and upper cervical spine: Normal marrow signal. Sinuses/Orbits: Chronic medial right orbital wall posttraumatic deformity. Clear paranasal sinuses. No mastoid effusion. Other: None. IMPRESSION: Incomplete peripheral enhancement at site of prior right middle cerebellar peduncle/pontine restricted diffusion involving the trigeminal root entry zone is highly suspicious for demyelinating lesion however cannot exclude acute to subacute infarct. No additional sites of abnormal intracranial enhancement. Electronically Signed   By: CPrimitivo GauzeM.D.   On: 01/02/2020 14:47   MR CERVICAL SPINE WO CONTRAST  Result Date: 01/02/2020 . IMPRESSION: 1. Normal MRI appearance of the cervical spinal cord, with no convincing cord signal abnormality to suggest demyelinating disease. 2. Degenerative disc bulge at C5-6 with resultant mild spinal stenosis. 3. Right-sided uncovertebral hypertrophy at C2-3 with resultant mild right C3 foraminal stenosis. No other significant foraminal encroachment within the cervical spine. Electronically Signed   By: BJeannine BogaM.D.   On: 01/02/2020 01:29   ECHOCARDIOGRAM COMPLETE  Result Date: 01/02/2020    ECHOCARDIOGRAM REPORT   Patient Name:   Kaitlyn Gray Date of Exam: 01/02/2020  Medical Rec #:    Sonographer:    Dustin Flock Referring Phys: [id number] [place]  1. Left ventricular ejection fraction, by estimation, is 55 to 60%. The left ventricle has normal function. The left ventricle has no regional wall motion abnormalities. There is moderate concentric left ventricular hypertrophy. Left ventricular diastolic parameters are indeterminate.  2. Right ventricular systolic function is normal. The right ventricular size is normal. There is normal pulmonary artery systolic pressure.  3. Left atrial size was moderately dilated.  4. The mitral valve is normal in structure. No evidence of mitral valve regurgitation. No evidence of mitral stenosis.  5. The aortic valve is grossly normal. Aortic valve regurgitation is mild.  6. The inferior vena cava is normal in size with greater than 50% respiratory variability, suggesting right atrial pressure of 3 mmHg. Comparison(s): No significant change from prior study. Conclusion(s)/Recommendation(s): No intracardiac source of embolism detected on this transthoracic study. A transesophageal echocardiogram is recommended to exclude cardiac source of embolism if clinically indicated. FINDINGS  Left Ventricle: Left ventricular ejection fraction, by estimation, is 55 to 60%.   VAS US CAROTID (at [facility] and [facility] only)  Result Date: 01/02/2020   Summary: Right Carotid: Velocities in the right ICA are consistent with a 1-39% stenosis. Left Carotid: Velocities in the left ICA are consistent with a 1-39% stenosis. Vertebrals:  Bilateral vertebral arteries demonstrate antegrade flow. Subclavians: Normal flow hemodynamics were seen in bilateral subclavian              arteries. *See table(s) above for measurements and observations.  Electronically signed by Monica Martinez MD on 01/02/2020 at 2:45:26 PM.    Final    DG FL GUIDED LUMBAR PUNCTURE  Result Date: 01/03/2020 Macy Mis, MD     01/03/2020 11:28 AM .Lumbar Puncture Procedure Note  Kaitlyn Gray [id number] 1976-04-24 Date:01/03/20 Time:11:14 AM Provider Performing:Praneil Posey Pronto Procedure: Lumbar Puncture (70263) Indication(s) Rule out meningitis  Consent Unable to obtain consent due to inability to find a medical decision maker for patient.  All reasonable efforts were made.  Another independent medical provider, Dr. Loleta Books, confirmed the benefits of this procedure outweigh the risks. Anesthesia Topical only with 1% lidocaine Time Out Verified patient identification, verified procedure, site/side was marked, verified correct patient position, special equipment/implants available, medications/allergies/relevant history reviewed, required imaging and test results available. Sterile Technique Maximal sterile technique including sterile barrier drape, hand hygiene, sterile gown, sterile gloves, mask, hair covering. Procedure Description Using palpation, approximate location of L2-L3 space identified.   Lidocaine used to anesthetize skin and subcutaneous tissue overlying this area.  A 20g spinal needle was then used to access the subarachnoid space. Opening pressure:Not obtained. Closing pressure:Not obtained. 9 mL CSF obtained. Complications/Tolerance None; patient tolerated the procedure well. EBL Minimal Specimen(s) CSF     Etta Quill PA-C [facility] [phone]   NEUROHOSPITALIST ADDENDUM Performed a face to face diagnostic evaluation.   I have reviewed the contents of history and physical exam as documented by PA/ARNP/Resident and agree with above documentation.  I have discussed and formulated the above plan as documented. Edits to the note have been made as needed.   44 year old female with polysubstance abuse including cocaine abuse and THC, hypertension, diabetes mellitus presents with 3 week history of generalized weakness, difficulty walking  as well as dizziness, blurry vision-has been seen in the ED multiple  times without clear diagnosis.  Approximately  1 week ago patient endorses sudden onset right arm and leg weakness and numbness and difficulty walking, slurred speech.  Patient denies IV drug abuse and states that she has had a single sexual partner since the last 1 year-denies history of STDs.  MRI brain was performed this time which showed restriction diffusion in the right middle cerebral peduncle-suggestive of demyelination versus subacute stroke.  Patient diffuse white matter disease throughout that appears nonspecific and not the periventricular or juxtacortical lesions typical for MS. cervical spine also shows no evidence of demyelination. MRI Brain w contrast shows peripheral enhancement-often seen in MS, but can also be present with subacute infarcts.   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  LP has been performed-protein slightly elevated at 60.  CSF cell count normal at 4, glucose 88.  Oligoclonal bands IgG index pending.   Recommendations -Echogram completed: limited study -PT OT -Start patient on aspirin, statin -BP goal 140 systolic -We will hold off on starting steroids for now   01/03/2020, 12:55 PM     MD [facility] [phone]   If 7pm to 7am, please call on call as listed on AMION.  

## 2020-01-03 NOTE — TOC CAGE-AID Note (Signed)
Transition of Care [facility]) - CAGE-AID Screening   Patient Details  Name: Kaitlyn Gray MRN: [id number] Date of Birth: 03/09/1976  [facility] CM/SW Contact:    Emeterio Reeve, [place] Phone Number: 01/03/2020, 2:43 PM   Clinical Narrative:  CSW met with pt at bedside. CSW introduced self and explained her role at the hospital.  Pt denies alcohol use. Pt reports cocaine use. Pt reports that shes going to stop. Pt declined resources.   CAGE-AID Screening:    Have You Ever Felt You Ought to Cut Down on Your Drinking or Drug Use?: Yes Have People Annoyed You By Critizing Your Drinking Or Drug Use?: No Have You Felt Bad Or Guilty About Your Drinking Or Drug Use?: No Have You Ever Had a Drink or Used Drugs First Thing In The Morning to Steady Your Nerves or to Get Rid of a Hangover?: No CAGE-AID Score: 1  Substance Abuse Education Offered: Yes (Pt denied)    [place] Clinical Social Worker [phone]

## 2020-01-03 NOTE — Social Work (Addendum)
RE: Aysa Larivee Date of Birth:  [birth year]    Date:  01/03/1976      To Whom It May Concern:  Please be advised that the above-named patient will require a short-term nursing home stay - anticipated 30 days or less for rehabilitation and strengthening.  The plan is for return home.     Jimmy Picket, Theresia Majors, [place] Clinical Social Worker [phone]

## 2020-01-03 NOTE — Progress Notes (Signed)
Patient signed leaving against medical advice paper. Talked to Dr. Leafy Half, MD on call earlier and notified again before patient signed paper. Donnamae Jude, RN took out IV saline lock and  patient got dressed and walked out the unit taking  the elevator down om own at 2040.

## 2020-01-04 ENCOUNTER — Emergency Department (HOSPITAL_COMMUNITY): Payer: Medicaid Other

## 2020-01-04 ENCOUNTER — Emergency Department (HOSPITAL_COMMUNITY)
Admission: EM | Admit: 2020-01-04 | Discharge: 2020-01-04 | Disposition: A | Discharge status: Home / Self Care | Payer: Medicaid Other | Attending: Emergency Medicine | Admitting: Emergency Medicine

## 2020-01-04 DIAGNOSIS — Z8673 Personal history of transient ischemic attack (TIA), and cerebral infarction without residual deficits: Secondary | ICD-10-CM | POA: Insufficient documentation

## 2020-01-04 DIAGNOSIS — R531 Weakness: Secondary | ICD-10-CM | POA: Insufficient documentation

## 2020-01-04 DIAGNOSIS — Z5321 Procedure and treatment not carried out due to patient leaving prior to being seen by health care provider: Secondary | ICD-10-CM | POA: Insufficient documentation

## 2020-01-04 LAB — VDRL, CSF: VDRL Quant, CSF: NONREACTIVE

## 2020-01-04 LAB — SJOGRENS SYNDROME-A EXTRACTABLE NUCLEAR ANTIBODY: SSA (Ro) (ENA) Antibody, IgG: <0.2 AI (ref 0.0–0.9)

## 2020-01-04 LAB — ANCA TITERS
Atypical P-ANCA titer: <1:20 {titer}
C-ANCA: <1:20 {titer}
P-ANCA: <1:20 {titer}

## 2020-01-04 LAB — IGG CSF INDEX
Albumin CSF-mCnc: 38 mg/dL — ABNORMAL HIGH (ref 8–37)
Albumin: 3.7 g/dL — ABNORMAL LOW (ref 3.8–4.8)
CSF IgG Index: 0.6 (ref 0.0–0.7)
IgG (Immunoglobin G), Serum: 1441 mg/dL (ref 586–1602)
IgG, CSF: 8.5 mg/dL — ABNORMAL HIGH (ref 0.0–6.7)
IgG/Alb Ratio, CSF: 0.22 (ref 0.00–0.25)

## 2020-01-04 LAB — HEPATITIS B SURFACE ANTIBODY, QUANTITATIVE: Hep B S AB Quant (Post): <3.1 m[IU]/mL — ABNORMAL LOW (ref >9.9)

## 2020-01-04 LAB — SJOGRENS SYNDROME-B EXTRACTABLE NUCLEAR ANTIBODY: SSB (La) (ENA) Antibody, IgG: <0.2 AI (ref 0.0–0.9)

## 2020-01-04 LAB — ANA: Anti Nuclear Antibody (ANA): POSITIVE — AB

## 2020-01-04 IMAGING — CR DG KNEE 1-2V*L*
2 series · 2 of 2 positions shown · non-contrast
Comparison: None.

CLINICAL DATA: Acute left knee pain after fall today.

EXAM:
LEFT KNEE - 1-2 VIEW

[knee ap]
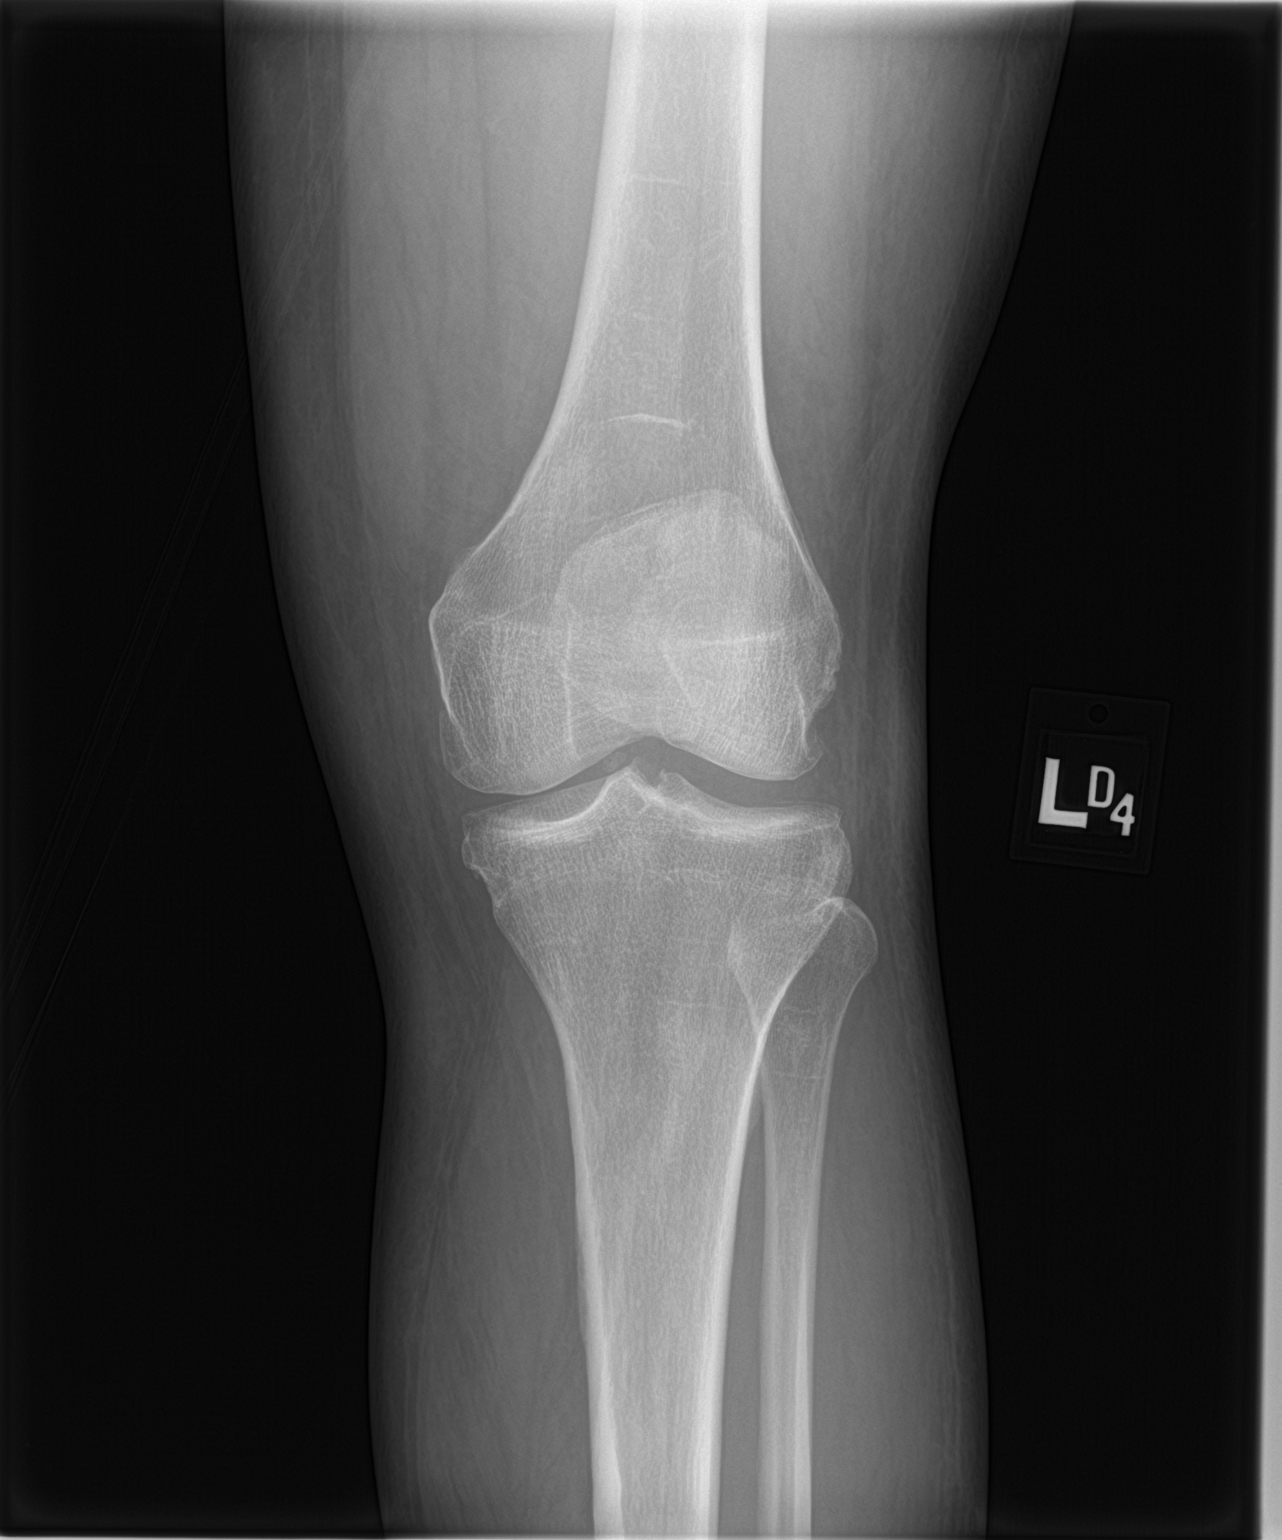

[knee lat]
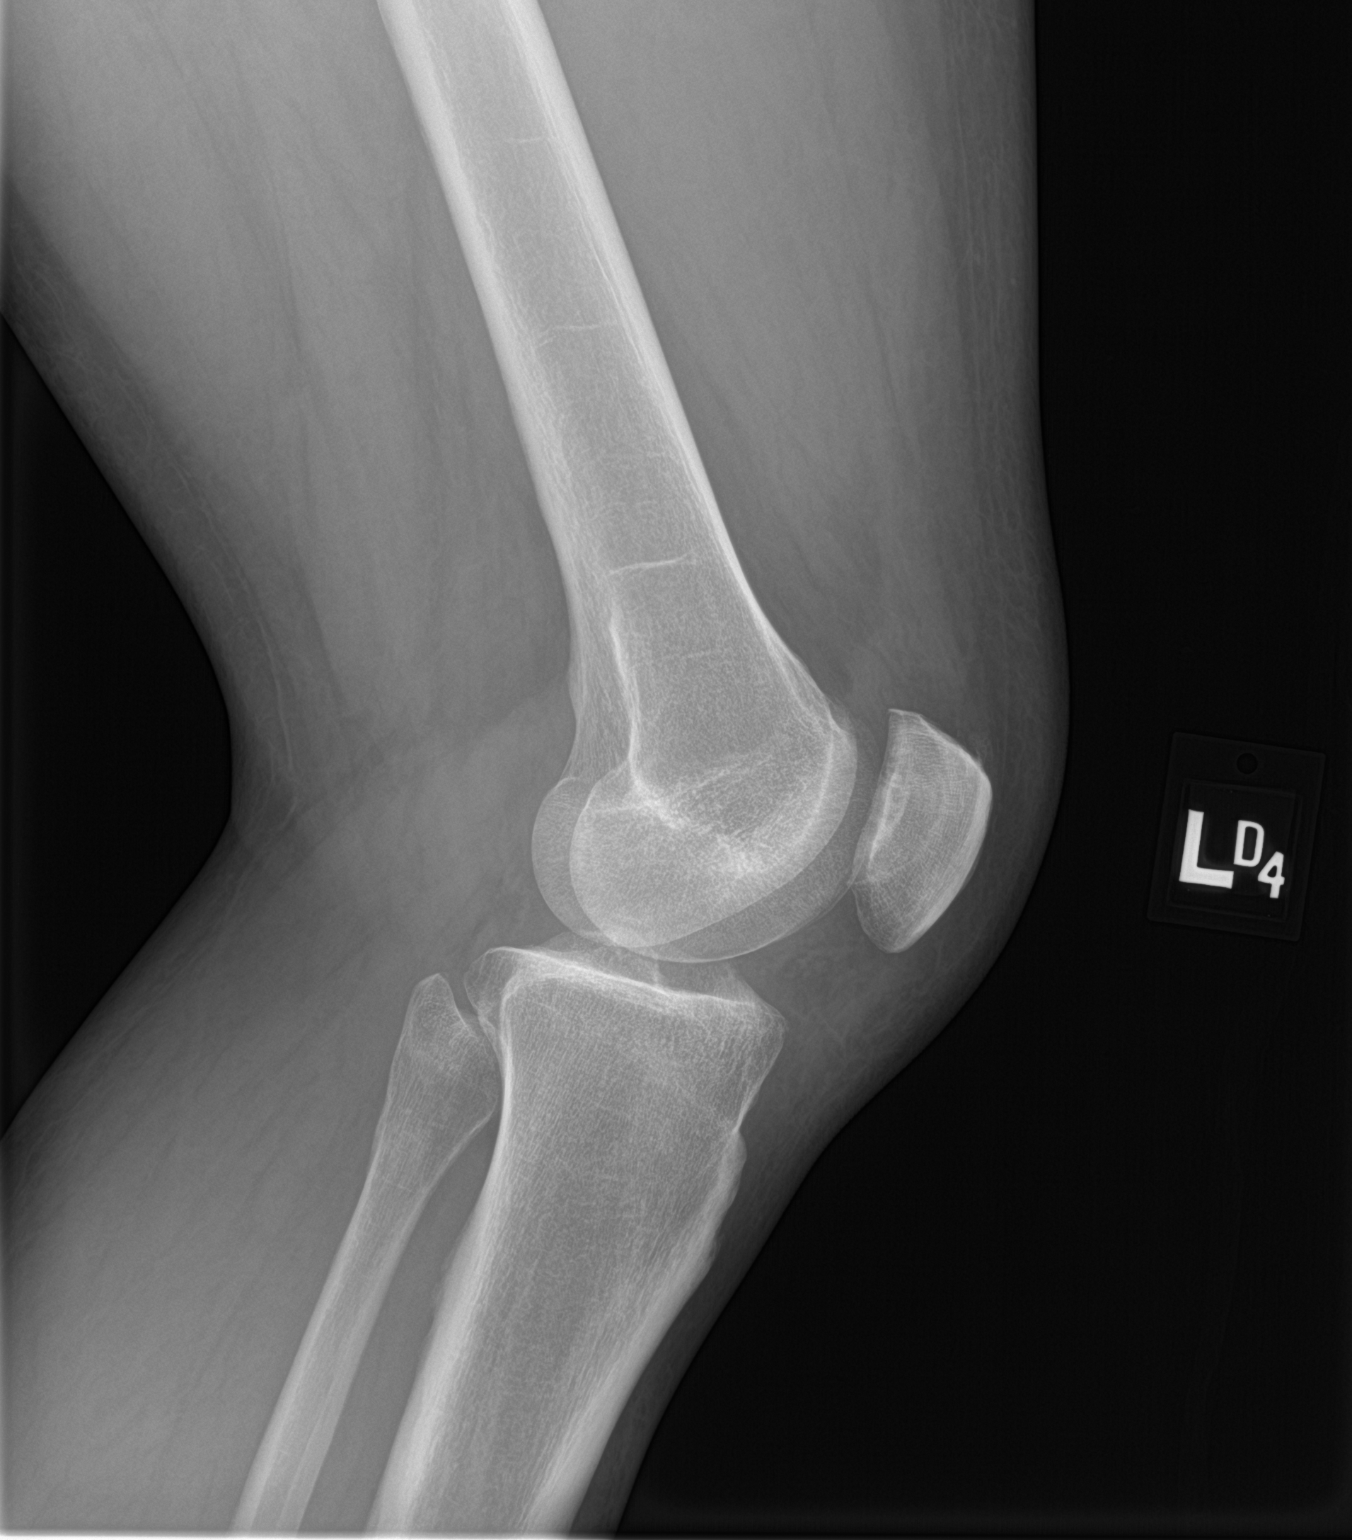

[2 of 2 positions shown; findings below may reference images not displayed]

FINDINGS: No evidence of fracture, dislocation, or joint effusion. No evidence
of arthropathy or other focal bone abnormality. Soft tissues are
unremarkable.
IMPRESSION: Negative.

## 2020-01-04 NOTE — ED Notes (Signed)
Pt called for vitals x3. No answer 

## 2020-01-04 NOTE — ED Triage Notes (Signed)
Pt here from home with c/o weakness from a stroke , pt has been having multiple falls from the prior stroke s, pt signed out ama from the hospital on the 18th of this month

## 2020-01-05 ENCOUNTER — Encounter (HOSPITAL_COMMUNITY): Payer: Self-pay

## 2020-01-05 ENCOUNTER — Emergency Department (HOSPITAL_COMMUNITY)
Admission: EM | Admit: 2020-01-05 | Discharge: 2020-01-05 | Disposition: A | Discharge status: Home / Self Care | Payer: Medicaid Other | Attending: Emergency Medicine | Admitting: Emergency Medicine

## 2020-01-05 DIAGNOSIS — Z5321 Procedure and treatment not carried out due to patient leaving prior to being seen by health care provider: Secondary | ICD-10-CM | POA: Insufficient documentation

## 2020-01-05 DIAGNOSIS — R531 Weakness: Secondary | ICD-10-CM | POA: Diagnosis not present

## 2020-01-05 LAB — ANTIPHOSPHOLIPID SYNDROME EVAL, BLD
Anticardiolipin IgA: <9 APL U/mL (ref 0–11)
Anticardiolipin IgG: 12 GPL U/mL (ref 0–14)
Anticardiolipin IgM: <9 MPL U/mL (ref 0–12)
DRVVT: 33.8 s (ref 0.0–47.0)
PTT Lupus Anticoagulant: 34.9 s (ref 0.0–51.9)
Phosphatydalserine, IgA: 2 APS IgA (ref 0–20)
Phosphatydalserine, IgG: 8 GPS IgG (ref 0–11)
Phosphatydalserine, IgM: 6 MPS IgM (ref 0–25)

## 2020-01-05 NOTE — ED Notes (Signed)
Pt called for reassessment x3 with no response 

## 2020-01-05 NOTE — ED Triage Notes (Addendum)
Pt arrives to ED w/ c/o R sided weakness and slurred speech since Sunday. Pt had previous stroke and was admitted and left AMA. Pt seen here yesterday for the same. No change in symptoms. Pt stating she is ready to be admitted again.

## 2020-01-06 LAB — CSF CULTURE W GRAM STAIN
Culture: NO GROWTH
Gram Stain: NONE SEEN

## 2020-01-06 LAB — OLIGOCLONAL BANDS, CSF + SERM

## 2020-01-07 LAB — CULTURE, BLOOD (ROUTINE X 2)
Culture: NO GROWTH
Culture: NO GROWTH

## 2020-01-09 NOTE — Discharge Summary (Signed)
[facility] Discharge Summary   Patient: Kaitlyn Gray JJK:[id number]   PCP: Lavinia Sharps, NP DOB: 1976/01/13   Date of admission: 01/01/2020   Date of discharge: 01/03/2020      Discharge Diagnoses:  Principal Problem:   Stroke [facility]) Active Problems:   Cocaine use disorder, severe, dependence (HCC)   Cannabis use disorder, moderate, dependence (HCC)   MDD (major depressive disorder), recurrent severe, without psychosis (HCC)   Bipolar disorder (HCC)   Diabetes mellitus without complication (HCC)   Hypertension    Discharge Condition: poor    History of present illness:  Kaitlyn Gray is a 44 y.o. F with Bipolar disorder, DM, polysubstance abuse, medication nonadherence, and HTN who presented with ataxia and right sided hemiparesis for over a week.  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     Principal discharge diagnosis Acute stroke    Hospital Course:  Patient admitted and MRI showed patchy enhancement of the cerebellum and adjacent pons, consistent with stroke versus multiple sclerosis.  Neurology were consulted.    MRI c-spine unremarkable.  MR brain with contrast showed incomplete peripheral enhancement of right middle cerebellar peduncle/pontine lesion, but CTA head and neck showed diffuse atherosclerosis and it was felt more likely her symptoms were the result of acute ischemic stroke.  Echo unremarkable.  The patient was NOT medically ready for discharge, when she eloped from the hospital.  I was not notified of her plan to leave against medical advice and had no opportunity to provide follow up arrangements.  Patient left the hospital AMA.      Acute metabolic encephalopathy  Bipolar disorder  Type 2 diabetes, poorly controlled wtihout complication  Hypertension  Morbid obesity BMI 33 with DM and HTN  Iron deficiency anemia Received Feraheme in the  hospital.            Consultations:  Neurology  The results of significant diagnostics from this hospitalization (including imaging, microbiology, ancillary and laboratory) are listed below for reference.    Significant Diagnostic Studies: CT ANGIO HEAD W OR WO CONTRAST  Result Date: 01/02/2020 CLINICAL DATA:  Vasculitis, CNS additional provided: History of diabetes mellitus type 2, hypertension. Patient reports headache, dizziness, unsteady gait, weakness in right arm. EXAM: CT ANGIOGRAPHY HEAD AND NECK TECHNIQUE: Multidetector CT imaging of the head and neck was performed using the standard protocol during bolus administration of intravenous contrast. Multiplanar CT image reconstructions and MIPs were obtained to evaluate the vascular anatomy. Carotid stenosis measurements (when applicable) are obtained utilizing NASCET criteria, using the distal internal carotid diameter as the denominator. CONTRAST:  59mL OMNIPAQUE IOHEXOL 350 MG/ML SOLN COMPARISON:  Contrast-enhanced brain MRI 01/02/2020, noncontrast brain MRI 01/01/2020, noncontrast head CT 12/16/2019. FINDINGS: CT HEAD FINDINGS Brain: Cerebral volume is normal for age. A lesion within the posterolateral right pons/right middle cerebellar peduncle is better appreciated on same-day brain MRI and MRI performed 01/01/2020. Redemonstrated chronic cortically based infarct within the right occipital lobe. Stable patchy hypoattenuation within the cerebral white matter which is advanced for age and nonspecific, but most commonly seen on the basis of chronic small vessel ischemia. There is no acute intracranial hemorrhage. No acute demarcated cortical infarct is identified. No extra-axial fluid collection. No evidence of intracranial mass. No midline shift. Partially empty sella turcica. Vascular: Reported below. Skull: Normal. Negative for fracture or focal lesion. Sinuses: Chronic deformity of the left nasal bone. Small left maxillary sinus  mucous retention cyst. No  significant mastoid effusion. Orbits: Visualized orbits show no acute finding. Chronic deformity of the right lamina papyracea. Review of the MIP images confirms the above findings CTA NECK FINDINGS Aortic arch: Standard aortic branching. The visualized aortic arch is unremarkable. No hemodynamically significant innominate or proximal subclavian artery stenosis. Right carotid system: CCA and ICA patent within the neck without significant stenosis (50% or greater). Tortuosity and partially retropharyngeal course of the cervical right ICA. Mild focal narrowing of the proximal to mid right ICA (series 10, image 159). Left carotid system: CCA and ICA patent within the neck without stenosis. Retropharyngeal course of the survey ICA. No significant atherosclerotic disease. Vertebral arteries: Codominant and patent within the neck bilaterally without significant stenosis. Skeleton: No acute bony abnormality or aggressive osseous lesion. Cervical spondylosis with prominent multilevel ventral osteophytes. Other neck: No neck mass or cervical lymphadenopathy. Thyroid unremarkable. Upper chest: No consolidation within the imaged lung apices. Review of the MIP images confirms the above findings CTA HEAD FINDINGS Anterior circulation: The intracranial internal carotid arteries are patent. Mixed plaque within both vessels. Moderate stenosis of the cavernous segment on the left. No significant stenosis of the intracranial right ICA. The M1 middle cerebral arteries are patent without significant stenosis. No M2 proximal branch occlusion is identified. Moderate/severe focal stenosis within a proximal M2 left MCA branch (series 15, image 45). There is irregularity of the M2 and more distal MCA branch vessels bilaterally. The left anterior cerebral artery is developmentally diminutive, but patent. The dominant right anterior cerebral artery is patent without significant proximal stenosis. No intracranial  aneurysm is identified. Posterior circulation: The intracranial vertebral arteries are patent without significant stenosis, as is the basilar artery. There is a high-grade focal stenosis within the proximal P2 right posterior cerebral artery (series 15, image 25). Additionally, the distal right posterior cerebral artery is poorly delineated suggestive of high-grade stenosis or occlusion. The left posterior cerebral artery is patent without high-grade proximal stenosis. High-grade focal stenosis within the P4 left posterior cerebral artery (series 14, image 28). Posterior communicating arteries are hypoplastic or absent bilaterally. Venous sinuses: Within limitations of contrast timing, no convincing thrombus. Anatomic variants: As described Review of the MIP images confirms the above findings IMPRESSION: CT head: 1. A lesion within the posterolateral right pons/right middle cerebellar peduncle was better appreciated on MRI examinations performed 01/02/2020 and 01/01/2020. Please refer to these prior studies for further description. 2. No evidence of interval intracranial abnormality. 3. Redemonstrated small chronic cortically based right occipital lobe infarct 4. Stable age advanced chronic cerebral white matter disease which is nonspecific, but most commonly seen on the basis of chronic small vessel ischemia. Sequela of demyelinating disease is also considered. 5. Tiny left maxillary sinus mucous retention cyst. CTA neck: The common carotid, internal carotid and vertebral arteries are patent within the neck without hemodynamically significant stenosis. Mild nonspecific focal narrowing of the proximal to mid cervical right ICA. CTA head: 1. Mixed plaque within both intracranial internal carotid arteries. Moderate stenosis of the cavernous left ICA. 2. Additional intracranial stenoses as follows, which may be secondary to atherosclerotic disease. It is difficult to exclude vasculitis on the basis of this examination.  3. Moderate/severe focal stenosis within a proximal M2 left MCA branch vessel. Additional irregularity of M2 and more distal MCA branches bilaterally. 4. High-grade focal stenosis within the proximal P2 right posterior cerebral artery. Additionally, the distal right posterior cerebral artery is poorly delineated suggestive of high-grade stenosis or occlusion. 5. High-grade focal stenosis within the P4  left posterior cerebral artery. Electronically Signed   By: Jackey Loge DO   On: 01/02/2020 16:03   DG Chest 2 View  Result Date: 01/01/2020 CLINICAL DATA:  Altered mental status, weakness and facial droop, possible CVA EXAM: CHEST - 2 VIEW COMPARISON:  Radiograph 12/16/2019, CT 03/24/2018 FINDINGS: Low lung volumes with ill-defined opacities in the lung bases possibly reflecting atelectasis though consolidation or sequela of aspiration could have a similar appearance in the setting of altered mental status given some mild basilar airways thickening as well. Mild cardiomegaly is similar to prior counting for differences in technique. No pneumothorax or visible effusion. No acute osseous or soft tissue abnormality. Telemetry leads overlie the chest. IMPRESSION: Low lung volumes with ill-defined opacities in the lung bases, favor atelectasis though consolidation or aspiration could present similarly. Electronically Signed   By: Kreg Shropshire M.D.   On: 01/01/2020 19:18   DG Knee 2 Views Left  Result Date: 01/04/2020 CLINICAL DATA:  Acute left knee pain after fall today. EXAM: LEFT KNEE - 1-2 VIEW COMPARISON:  None. FINDINGS: No evidence of fracture, dislocation, or joint effusion. No evidence of arthropathy or other focal bone abnormality. Soft tissues are unremarkable. IMPRESSION: Negative. Electronically Signed   By: Lupita Raider M.D.   On: 01/04/2020 15:15   CT Head Wo Contrast  Result Date: 12/16/2019 CLINICAL DATA:  Recent seizure activity EXAM: CT HEAD WITHOUT CONTRAST TECHNIQUE: Contiguous axial  images were obtained from the base of the skull through the vertex without intravenous contrast. COMPARISON:  05/12/2018 FINDINGS: Brain: Somewhat limited by patient motion artifact. There are changes of prior right occipital infarct. This is new from the prior exam from 2019. No acute infarct, acute hemorrhage or findings of space-occupying mass lesion are noted. Vascular: No hyperdense vessel or unexpected calcification. Skull: Normal. Negative for fracture or focal lesion. Sinuses/Orbits: No acute finding. Other: None. IMPRESSION: Old right occipital infarct.  No acute abnormality noted. Electronically Signed   By: Alcide Clever M.D.   On: 12/16/2019 08:45   CT ANGIO NECK W OR WO CONTRAST  Result Date: 01/02/2020 CLINICAL DATA:  Vasculitis, CNS additional provided: History of diabetes mellitus type 2, hypertension. Patient reports headache, dizziness, unsteady gait, weakness in right arm. EXAM: CT ANGIOGRAPHY HEAD AND NECK TECHNIQUE: Multidetector CT imaging of the head and neck was performed using the standard protocol during bolus administration of intravenous contrast. Multiplanar CT image reconstructions and MIPs were obtained to evaluate the vascular anatomy. Carotid stenosis measurements (when applicable) are obtained utilizing NASCET criteria, using the distal internal carotid diameter as the denominator. CONTRAST:  75mL OMNIPAQUE IOHEXOL 350 MG/ML SOLN COMPARISON:  Contrast-enhanced brain MRI 01/02/2020, noncontrast brain MRI 01/01/2020, noncontrast head CT 12/16/2019. FINDINGS: CT HEAD FINDINGS Brain: Cerebral volume is normal for age. A lesion within the posterolateral right pons/right middle cerebellar peduncle is better appreciated on same-day brain MRI and MRI performed 01/01/2020. Redemonstrated chronic cortically based infarct within the right occipital lobe. Stable patchy hypoattenuation within the cerebral white matter which is advanced for age and nonspecific, but most commonly seen on the  basis of chronic small vessel ischemia. There is no acute intracranial hemorrhage. No acute demarcated cortical infarct is identified. No extra-axial fluid collection. No evidence of intracranial mass. No midline shift. Partially empty sella turcica. Vascular: Reported below. Skull: Normal. Negative for fracture or focal lesion. Sinuses: Chronic deformity of the left nasal bone. Small left maxillary sinus mucous retention cyst. No significant mastoid effusion. Orbits: Visualized orbits show no  acute finding. Chronic deformity of the right lamina papyracea. Review of the MIP images confirms the above findings CTA NECK FINDINGS Aortic arch: Standard aortic branching. The visualized aortic arch is unremarkable. No hemodynamically significant innominate or proximal subclavian artery stenosis. Right carotid system: CCA and ICA patent within the neck without significant stenosis (50% or greater). Tortuosity and partially retropharyngeal course of the cervical right ICA. Mild focal narrowing of the proximal to mid right ICA (series 10, image 159). Left carotid system: CCA and ICA patent within the neck without stenosis. Retropharyngeal course of the survey ICA. No significant atherosclerotic disease. Vertebral arteries: Codominant and patent within the neck bilaterally without significant stenosis. Skeleton: No acute bony abnormality or aggressive osseous lesion. Cervical spondylosis with prominent multilevel ventral osteophytes. Other neck: No neck mass or cervical lymphadenopathy. Thyroid unremarkable. Upper chest: No consolidation within the imaged lung apices. Review of the MIP images confirms the above findings CTA HEAD FINDINGS Anterior circulation: The intracranial internal carotid arteries are patent. Mixed plaque within both vessels. Moderate stenosis of the cavernous segment on the left. No significant stenosis of the intracranial right ICA. The M1 middle cerebral arteries are patent without significant stenosis.  No M2 proximal branch occlusion is identified. Moderate/severe focal stenosis within a proximal M2 left MCA branch (series 15, image 45). There is irregularity of the M2 and more distal MCA branch vessels bilaterally. The left anterior cerebral artery is developmentally diminutive, but patent. The dominant right anterior cerebral artery is patent without significant proximal stenosis. No intracranial aneurysm is identified. Posterior circulation: The intracranial vertebral arteries are patent without significant stenosis, as is the basilar artery. There is a high-grade focal stenosis within the proximal P2 right posterior cerebral artery (series 15, image 25). Additionally, the distal right posterior cerebral artery is poorly delineated suggestive of high-grade stenosis or occlusion. The left posterior cerebral artery is patent without high-grade proximal stenosis. High-grade focal stenosis within the P4 left posterior cerebral artery (series 14, image 28). Posterior communicating arteries are hypoplastic or absent bilaterally. Venous sinuses: Within limitations of contrast timing, no convincing thrombus. Anatomic variants: As described Review of the MIP images confirms the above findings IMPRESSION: CT head: 1. A lesion within the posterolateral right pons/right middle cerebellar peduncle was better appreciated on MRI examinations performed 01/02/2020 and 01/01/2020. Please refer to these prior studies for further description. 2. No evidence of interval intracranial abnormality. 3. Redemonstrated small chronic cortically based right occipital lobe infarct 4. Stable age advanced chronic cerebral white matter disease which is nonspecific, but most commonly seen on the basis of chronic small vessel ischemia. Sequela of demyelinating disease is also considered. 5. Tiny left maxillary sinus mucous retention cyst. CTA neck: The common carotid, internal carotid and vertebral arteries are patent within the neck without  hemodynamically significant stenosis. Mild nonspecific focal narrowing of the proximal to mid cervical right ICA. CTA head: 1. Mixed plaque within both intracranial internal carotid arteries. Moderate stenosis of the cavernous left ICA. 2. Additional intracranial stenoses as follows, which may be secondary to atherosclerotic disease. It is difficult to exclude vasculitis on the basis of this examination. 3. Moderate/severe focal stenosis within a proximal M2 left MCA branch vessel. Additional irregularity of M2 and more distal MCA branches bilaterally. 4. High-grade focal stenosis within the proximal P2 right posterior cerebral artery. Additionally, the distal right posterior cerebral artery is poorly delineated suggestive of high-grade stenosis or occlusion. 5. High-grade focal stenosis within the P4 left posterior cerebral artery. Electronically Signed  By: Jackey Loge DO   On: 01/02/2020 16:03   MR BRAIN WO CONTRAST  Result Date: 01/01/2020 CLINICAL DATA:  Possible stroke EXAM: MRI HEAD WITHOUT CONTRAST TECHNIQUE: Multiplanar, multiecho pulse sequences of the brain and surrounding structures were obtained without intravenous contrast. COMPARISON:  None. FINDINGS: Brain: There is mildly reduced diffusion along the right middle cerebellar peduncle and adjacent pons including the trigeminal root entry zone. Foci of susceptibility are present in the thalamus bilaterally compatible with chronic microhemorrhages or less likely mineralization. Lateral ventricles are mildly prominent likely on an ex vacuo basis. There is patchy and confluent T2 hyperintensity in the supratentorial and pontine white matter, which is nonspecific but may reflect moderate chronic microvascular ischemic changes, greater than expected for age. There is no intracranial mass or significant mass effect. There is no hydrocephalus or extra-axial fluid collection. Vascular: Major vessel flow voids at the skull base are preserved. Skull and  upper cervical spine: Normal marrow signal is preserved. Sinuses/Orbits: Minor mucosal thickening. There is chronic deformity of the right medial orbital wall, which may be posttraumatic. Other: Sella is unremarkable.  Mastoid air cells are clear. 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 A demyelinating lesion commonly involves this region but is considered less likely given appearance of white matter disease. Age advanced chronic microvascular ischemic changes. Electronically Signed   By: Guadlupe Spanish M.D.   On: 01/01/2020 16:58   MR BRAIN W CONTRAST  Result Date: 01/02/2020 CLINICAL DATA:  Stroke, follow-up. EXAM: MRI HEAD WITH CONTRAST TECHNIQUE: Multiplanar, multiecho pulse sequences of the brain and surrounding structures were obtained with intravenous contrast. CONTRAST:  7.53mL GADAVIST GADOBUTROL 1 MMOL/ML IV SOLN COMPARISON:  01/01/2020 MRI head. FINDINGS: Brain: Mild diffuse parenchymal volume loss with ex vacuo dilatation. Site of prior restricted diffusion involving the right middle cerebellar peduncle, pons and right trigeminal root entry zone demonstrates incomplete peripheral enhancement. Chronic microvascular ischemic changes. There are no other foci of abnormal intracranial enhancement. No midline shift or extra-axial fluid collection. No mass lesion. Vascular: Normal flow voids. Skull and upper cervical spine: Normal marrow signal. Sinuses/Orbits: Chronic medial right orbital wall posttraumatic deformity. Clear paranasal sinuses. No mastoid effusion. Other: None. IMPRESSION: Incomplete peripheral enhancement at site of prior right middle cerebellar peduncle/pontine restricted diffusion involving the trigeminal root entry zone is highly suspicious for demyelinating lesion however cannot exclude acute to subacute infarct. No additional sites of abnormal intracranial  enhancement. Electronically Signed   By: Stana Bunting M.D.   On: 01/02/2020 14:47   MR CERVICAL SPINE WO CONTRAST  Result Date: 01/02/2020 CLINICAL DATA:  Initial evaluation for multiple sclerosis, new event. EXAM: MRI CERVICAL SPINE WITHOUT CONTRAST TECHNIQUE: Multiplanar, multisequence MR imaging of the cervical spine was performed. No intravenous contrast was administered. COMPARISON:  Prior brain MRI from 01/01/2020. FINDINGS: Alignment: Straightening of the normal cervical lordosis. No listhesis or subluxation. Vertebrae: Vertebral body height maintained without evidence for acute or chronic fracture. Bone marrow signal intensity within normal limits. No discrete or worrisome osseous lesions. No abnormal marrow edema. Cord: Evaluation of the cervical spinal cord mildly limited by motion. No convincing cord signal abnormality to suggest demyelinating disease. Cord caliber and morphology within normal limits. Posterior Fossa, vertebral arteries, paraspinal tissues: T2 signal abnormality involving the right middle cerebellar peduncle again noted, better seen on prior brain MRI. Additional nonspecific T2/STIR signal intensity noted within the pons. Craniocervical junction within normal limits.  Paraspinous and prevertebral soft tissues are normal. Normal flow voids seen within the vertebral arteries bilaterally. Disc levels: C2-C3: Mild right-sided uncovertebral hypertrophy without significant disc bulge. No significant spinal stenosis. Mild right C3 foraminal stenosis. No left foraminal encroachment. C3-C4: Mild bilateral uncovertebral hypertrophy without significant disc bulge. No canal or foraminal stenosis. C4-C5:  Unremarkable. C5-C6: Diffuse disc bulge with bilateral uncovertebral hypertrophy. Flattening and partial effacement of the ventral thecal sac with resultant mild spinal stenosis. No significant cord deformity. Foramina are remain patent. C6-C7: Disc desiccation with minimal disc bulge. No  canal or foraminal stenosis. C7-T1: Normal interspace. Mild facet hypertrophy. No canal or foraminal stenosis. Visualized upper thoracic spine demonstrates no significant finding. IMPRESSION: 1. Normal MRI appearance of the cervical spinal cord, with no convincing cord signal abnormality to suggest demyelinating disease. 2. Degenerative disc bulge at C5-6 with resultant mild spinal stenosis. 3. Right-sided uncovertebral hypertrophy at C2-3 with resultant mild right C3 foraminal stenosis. No other significant foraminal encroachment within the cervical spine. Electronically Signed   By: Rise Mu M.D.   On: 01/02/2020 01:29   DG Chest Portable 1 View  Result Date: 12/16/2019 CLINICAL DATA:  Altered mental status. EXAM: PORTABLE CHEST 1 VIEW COMPARISON:  11/28/2019 chest radiograph. FINDINGS: Hypoinflated lungs. Cardiomegaly, unchanged. Central pulmonary vascular congestion. New perihilar and bibasilar patchy opacities. No pneumothorax or pleural effusion. Osseous structures are unchanged. IMPRESSION: New central pulmonary vascular congestion. Minimal perihilar and bibasilar opacities, atelectasis versus minimal pulmonary edema. Cardiomegaly, unchanged. Electronically Signed   By: Stana Bunting M.D.   On: 12/16/2019 08:09   ECHOCARDIOGRAM COMPLETE  Result Date: 01/02/2020    ECHOCARDIOGRAM REPORT   Patient Name:   DARCI LYKINS Date of Exam: 01/02/2020 Medical Rec #:  [id number]         Height:       63.0 in Accession #:    [id number]        Weight:       190.0 lb Date of Birth:  08-08-1975         BSA:          1.892 m Patient Age:    44 years          BP:           169/83 mmHg Patient Gender: F                 HR:           65 bpm. Exam Location:  Inpatient Procedure: 2D Echo, Cardiac Doppler and Color Doppler Indications:    CVA  History:        Patient has prior history of Echocardiogram examinations, most                 recent 12/07/2017. Risk Factors:Hypertension, Diabetes and                  Current Smoker. Polysubstance abuse, Noncompliance.  Sonographer:    Lavenia Atlas Referring Phys: [id number] [facility] IMPRESSIONS  1. Left ventricular ejection fraction, by estimation, is 55 to 60%. The left ventricle has normal function. The left ventricle has no regional wall motion abnormalities. There is moderate concentric left ventricular hypertrophy. Left ventricular diastolic parameters are indeterminate.  2. Right ventricular systolic function is normal. The right ventricular size is normal. There is normal pulmonary artery systolic pressure.  3. Left atrial size was moderately dilated.  4. The mitral valve is normal in structure. No evidence of mitral valve regurgitation.  No evidence of mitral stenosis.  5. The aortic valve is grossly normal. Aortic valve regurgitation is mild.  6. The inferior vena cava is normal in size with greater than 50% respiratory variability, suggesting right atrial pressure of 3 mmHg. Comparison(s): No significant change from prior study. Conclusion(s)/Recommendation(s): No intracardiac source of embolism detected on this transthoracic study. A transesophageal echocardiogram is recommended to exclude cardiac source of embolism if clinically indicated. FINDINGS  Left Ventricle: Left ventricular ejection fraction, by estimation, is 55 to 60%. The left ventricle has normal function. The left ventricle has no regional wall motion abnormalities. The left ventricular internal cavity size was normal in size. There is  moderate concentric left ventricular hypertrophy. Left ventricular diastolic parameters are indeterminate. Right Ventricle: The right ventricular size is normal. No increase in right ventricular wall thickness. Right ventricular systolic function is normal. There is normal pulmonary artery systolic pressure. The tricuspid regurgitant velocity is 2.47 m/s, and  with an assumed right atrial pressure of 3 mmHg, the estimated right ventricular systolic pressure is  27.4 mmHg. Left Atrium: Left atrial size was moderately dilated. Right Atrium: Right atrial size was normal in size. Pericardium: There is no evidence of pericardial effusion. Mitral Valve: The mitral valve is normal in structure. No evidence of mitral valve regurgitation. No evidence of mitral valve stenosis. Tricuspid Valve: The tricuspid valve is normal in structure. Tricuspid valve regurgitation is trivial. No evidence of tricuspid stenosis. Aortic Valve: The aortic valve is grossly normal. Aortic valve regurgitation is mild. Pulmonic Valve: The pulmonic valve was grossly normal. Pulmonic valve regurgitation is not visualized. No evidence of pulmonic stenosis. Aorta: The aortic root, ascending aorta and aortic arch are all structurally normal, with no evidence of dilitation or obstruction. Venous: The inferior vena cava is normal in size with greater than 50% respiratory variability, suggesting right atrial pressure of 3 mmHg. IAS/Shunts: The atrial septum is grossly normal.  LEFT VENTRICLE PLAX 2D LVIDd:         4.70 cm  Diastology LVIDs:         3.30 cm  LV e' lateral:   7.07 cm/s LV PW:         1.40 cm  LV E/e' lateral: 8.0 LV IVS:        1.40 cm  LV e' medial:    6.64 cm/s LVOT diam:     2.20 cm  LV E/e' medial:  8.5 LV SV:         99 LV SV Index:   52 LVOT Area:     3.80 cm  RIGHT VENTRICLE RV Basal diam:  2.80 cm RV S prime:     10.40 cm/s TAPSE (M-mode): 3.1 cm LEFT ATRIUM              Index       RIGHT ATRIUM           Index LA diam:        3.60 cm  1.90 cm/m  RA Area:     13.50 cm LA Vol (A2C):   73.9 ml  39.06 ml/m RA Volume:   30.90 ml  16.33 ml/m LA Vol (A4C):   101.0 ml 53.38 ml/m LA Biplane Vol: 91.5 ml  48.36 ml/m  AORTIC VALVE LVOT Vmax:   105.00 cm/s LVOT Vmean:  80.400 cm/s LVOT VTI:    0.260 m  AORTA Ao Root diam: 2.70 cm MITRAL VALVE  TRICUSPID VALVE MV Area (PHT): 3.60 cm    TR Peak grad:   24.4 mmHg MV Decel Time: 211 msec    TR Vmax:        247.00 cm/s MV E velocity:  56.60 cm/s MV A velocity: 69.00 cm/s  SHUNTS MV E/A ratio:  0.82        Systemic VTI:  0.26 m                            Systemic Diam: 2.20 cm Jodelle Red MD Electronically signed by Jodelle Red MD Signature Date/Time: 01/02/2020/4:51:44 PM    Final    VAS US CAROTID (at [facility] and [facility] only)  Result Date: 01/02/2020 Carotid Arterial Duplex Study Indications:       Acute to subacute infarction involving the right middle                    cerebellar peduncle and adjacent pons including the                    trigenminal root entry zone. Comparison Study:  No prior study Performing Technologist: Gertie Fey MHA, RDMS, RVT, RDCS  Examination Guidelines: A complete evaluation includes B-mode imaging, spectral Doppler, color Doppler, and power Doppler as needed of all accessible portions of each vessel. Bilateral testing is considered an integral part of a complete examination. Limited examinations for reoccurring indications may be performed as noted.  Right Carotid Findings: +----------+--------+--------+--------+-----------------------+--------+           PSV cm/sEDV cm/sStenosisPlaque Description     Comments +----------+--------+--------+--------+-----------------------+--------+ CCA Prox  95      19              smooth and heterogenous         +----------+--------+--------+--------+-----------------------+--------+ CCA Distal57      13                                              +----------+--------+--------+--------+-----------------------+--------+ ICA Prox  37      10                                              +----------+--------+--------+--------+-----------------------+--------+ ICA Distal110     33                                     tortuous +----------+--------+--------+--------+-----------------------+--------+ ECA       50      8                                                +----------+--------+--------+--------+-----------------------+--------+ +----------+--------+-------+----------------+-------------------+           PSV cm/sEDV cmsDescribe        Arm Pressure (mmHG) +----------+--------+-------+----------------+-------------------+ Subclavian110            Multiphasic, WNL                    +----------+--------+-------+----------------+-------------------+ +---------+--------+--+--------+--+---------+ VertebralPSV cm/s38EDV cm/s11Antegrade +---------+--------+--+--------+--+---------+  Left  Carotid Findings: +----------+--------+--------+--------+-----------------------+--------+           PSV cm/sEDV cm/sStenosisPlaque Description     Comments +----------+--------+--------+--------+-----------------------+--------+ CCA Prox  92      19                                              +----------+--------+--------+--------+-----------------------+--------+ CCA Distal58      17              smooth and heterogenous         +----------+--------+--------+--------+-----------------------+--------+ ICA Prox  38      14                                              +----------+--------+--------+--------+-----------------------+--------+ ICA Distal56      20                                              +----------+--------+--------+--------+-----------------------+--------+ ECA       66      13                                              +----------+--------+--------+--------+-----------------------+--------+ +----------+--------+--------+----------------+-------------------+           PSV cm/sEDV cm/sDescribe        Arm Pressure (mmHG) +----------+--------+--------+----------------+-------------------+ Subclavian101             Multiphasic, WNL                    +----------+--------+--------+----------------+-------------------+ +---------+--------+--+--------+--+---------+ VertebralPSV cm/s64EDV  cm/s17Antegrade +---------+--------+--+--------+--+---------+   Summary: Right Carotid: Velocities in the right ICA are consistent with a 1-39% stenosis. Left Carotid: Velocities in the left ICA are consistent with a 1-39% stenosis. Vertebrals:  Bilateral vertebral arteries demonstrate antegrade flow. Subclavians: Normal flow hemodynamics were seen in bilateral subclavian              arteries. *See table(s) above for measurements and observations.  Electronically signed by Sherald Hess MD on 01/02/2020 at 2:45:26 PM.    Final    DG FL GUIDED LUMBAR PUNCTURE  Result Date: 01/03/2020 CLINICAL DATA:  Encephalopathy EXAM: DIAGNOSTIC LUMBAR PUNCTURE UNDER FLUOROSCOPIC GUIDANCE FLUOROSCOPY TIME:  Fluoroscopy Time:  6 seconds Number of Acquired Spot Images: 1 PROCEDURE: Informed consent was obtained from the patient prior to the procedure, including potential complications of headache, allergy, and pain. With the patient prone, the lower back was prepped with Betadine. 1% Lidocaine was used for local anesthesia. Lumbar puncture was performed at the L2-L3 level using a 22 gauge needle with return of clear CSF. 9 ml of CSF were obtained for laboratory studies. The patient tolerated the procedure well and there were no apparent complications. IMPRESSION: Technically successful fluoroscopic guided lumbar puncture. Electronically Signed   By: Guadlupe Spanish M.D.   On: 01/03/2020 12:35    Microbiology: Recent Results (from the past 240 hour(s))  SARS Coronavirus 2 by RT PCR (hospital order, performed in [facility] hospital lab) Nasopharyngeal Nasopharyngeal Swab     Status:  None   Collection Time: 01/01/20  6:03 PM   Specimen: Nasopharyngeal Swab  Result Value Ref Range Status   SARS Coronavirus 2 NEGATIVE NEGATIVE Final    Comment: (NOTE) SARS-CoV-2 target nucleic acids are NOT DETECTED.  The SARS-CoV-2 RNA is generally detectable in upper and lower respiratory specimens during the acute phase of  infection. The lowest concentration of SARS-CoV-2 viral copies this assay can detect is 250 copies / mL. A negative result does not preclude SARS-CoV-2 infection and should not be used as the sole basis for treatment or other patient management decisions.  A negative result may occur with improper specimen collection / handling, submission of specimen other than nasopharyngeal swab, presence of viral mutation(s) within the areas targeted by this assay, and inadequate number of viral copies (<250 copies / mL). A negative result must be combined with clinical observations, patient history, and epidemiological information.  Fact Sheet for Patients:   BoilerBrush.com.cy  Fact Sheet for Healthcare Providers: https://pope.com/  This test is not yet approved or  cleared by the [place] FDA and has been authorized for detection and/or diagnosis of SARS-CoV-2 by FDA under an Emergency Use Authorization (EUA).  This EUA will remain in effect (meaning this test can be used) for the duration of the COVID-19 declaration under Section 564(b)(1) of the Act, 21 U.S.C. section 360bbb-3(b)(1), unless the authorization is terminated or revoked sooner.  Performed at [facility], [address], [place], [place] [address]   Culture, blood (Routine X 2) w Reflex to ID Panel     Status: None   Collection Time: 01/02/20  9:33 PM   Specimen: BLOOD  Result Value Ref Range Status   Specimen Description BLOOD LEFT ANTECUBITAL  Final   Special Requests   Final    BOTTLES DRAWN AEROBIC AND ANAEROBIC Blood Culture results may not be optimal due to an inadequate volume of blood received in culture bottles   Culture   Final    NO GROWTH 5 DAYS Performed at [facility], [address], [place], [place] [address]    Report Status 01/07/2020 FINAL  Final  Culture, blood (Routine X 2) w Reflex to ID Panel     Status: None   Collection Time:  01/02/20  9:41 PM   Specimen: BLOOD RIGHT HAND  Result Value Ref Range Status   Specimen Description BLOOD RIGHT HAND  Final   Special Requests   Final    BOTTLES DRAWN AEROBIC ONLY Blood Culture results may not be optimal due to an inadequate volume of blood received in culture bottles   Culture   Final    NO GROWTH 5 DAYS Performed at [facility], [address] [place], [place] [address]    Report Status 01/07/2020 FINAL  Final  CSF culture     Status: None   Collection Time: 01/03/20 11:22 AM   Specimen: PATH Cytology CSF; Cerebrospinal Fluid  Result Value Ref Range Status   Specimen Description CSF  Final   Special Requests NONE  Final   Gram Stain NO WBC SEEN NO ORGANISMS SEEN CYTOSPIN SMEAR   Final   Culture   Final    NO GROWTH 3 DAYS Performed at [facility], [address] [place], [place] [address]    Report Status 01/06/2020 FINAL  Final     Labs: CBC: No results for input(s): WBC, NEUTROABS, HGB, HCT, MCV, PLT in the last 168 hours. Basic Metabolic Panel: No  results for input(s): NA, K, CL, CO2, GLUCOSE, BUN, CREATININE, CALCIUM, MG, PHOS in the last 168 hours. Liver Function Tests: Recent Labs  Lab 01/03/20 1122  ALBUMIN 3.7*   No results for input(s): LIPASE, AMYLASE in the last 168 hours. No results for input(s): AMMONIA in the last 168 hours. Cardiac Enzymes: No results for input(s): CKTOTAL, CKMB, CKMBINDEX, TROPONINI in the last 168 hours. BNP (last 3 results) No results for input(s): BNP in the last 8760 hours. CBG: Recent Labs  Lab 01/02/20 1745 01/02/20 2125 01/03/20 0625 01/03/20 1140 01/03/20 1636  GLUCAP 128* 162* 158* 124* 132*   Time spent: 2 minutes  Signed:  Earl Lites Margaretann Abate  [facility] 01/09/2020, 8:25 AM

## 2020-01-20 ENCOUNTER — Other Ambulatory Visit: Payer: Self-pay

## 2020-01-20 ENCOUNTER — Emergency Department (HOSPITAL_COMMUNITY)
Admission: EM | Admit: 2020-01-20 | Discharge: 2020-01-20 | Disposition: A | Discharge status: Home / Self Care | Payer: Medicaid Other | Attending: Emergency Medicine | Admitting: Emergency Medicine

## 2020-01-20 ENCOUNTER — Encounter (HOSPITAL_COMMUNITY): Payer: Self-pay | Admitting: Emergency Medicine

## 2020-01-20 ENCOUNTER — Emergency Department (HOSPITAL_COMMUNITY): Payer: Medicaid Other

## 2020-01-20 DIAGNOSIS — F1410 Cocaine abuse, uncomplicated: Secondary | ICD-10-CM | POA: Diagnosis not present

## 2020-01-20 DIAGNOSIS — Z79899 Other long term (current) drug therapy: Secondary | ICD-10-CM | POA: Diagnosis not present

## 2020-01-20 DIAGNOSIS — E1100 Type 2 diabetes mellitus with hyperosmolarity without nonketotic hyperglycemic-hyperosmolar coma (NKHHC): Secondary | ICD-10-CM | POA: Insufficient documentation

## 2020-01-20 DIAGNOSIS — Z91040 Latex allergy status: Secondary | ICD-10-CM | POA: Diagnosis not present

## 2020-01-20 DIAGNOSIS — I10 Essential (primary) hypertension: Secondary | ICD-10-CM | POA: Diagnosis not present

## 2020-01-20 DIAGNOSIS — Z789 Other specified health status: Secondary | ICD-10-CM

## 2020-01-20 DIAGNOSIS — I639 Cerebral infarction, unspecified: Secondary | ICD-10-CM | POA: Insufficient documentation

## 2020-01-20 DIAGNOSIS — F17210 Nicotine dependence, cigarettes, uncomplicated: Secondary | ICD-10-CM | POA: Insufficient documentation

## 2020-01-20 DIAGNOSIS — R079 Chest pain, unspecified: Secondary | ICD-10-CM | POA: Diagnosis present

## 2020-01-20 DIAGNOSIS — Z794 Long term (current) use of insulin: Secondary | ICD-10-CM | POA: Diagnosis not present

## 2020-01-20 DIAGNOSIS — Z20822 Contact with and (suspected) exposure to covid-19: Secondary | ICD-10-CM | POA: Diagnosis not present

## 2020-01-20 LAB — CBG MONITORING, ED
Glucose-Capillary: 156 mg/dL — ABNORMAL HIGH (ref 70–99)
Glucose-Capillary: 117 mg/dL — ABNORMAL HIGH (ref 70–99)

## 2020-01-20 LAB — CBC
HCT: 33.4 % — ABNORMAL LOW (ref 36.0–46.0)
Hemoglobin: 9.9 g/dL — ABNORMAL LOW (ref 12.0–15.0)
MCH: 24.5 pg — ABNORMAL LOW (ref 26.0–34.0)
MCHC: 29.6 g/dL — ABNORMAL LOW (ref 30.0–36.0)
MCV: 82.7 fL (ref 80.0–100.0)
Platelets: 313 10*3/uL (ref 150–400)
RBC: 4.04 MIL/uL (ref 3.87–5.11)
RDW: 22.1 % — ABNORMAL HIGH (ref 11.5–15.5)
WBC: 6.4 10*3/uL (ref 4.0–10.5)
nRBC: 0 % (ref 0.0–0.2)

## 2020-01-20 LAB — BASIC METABOLIC PANEL
Anion gap: 11 (ref 5–15)
BUN: 10 mg/dL (ref 6–20)
CO2: 25 mmol/L (ref 22–32)
Calcium: 9.3 mg/dL (ref 8.9–10.3)
Chloride: 105 mmol/L (ref 98–111)
Creatinine, Ser: 1.04 mg/dL — ABNORMAL HIGH (ref 0.44–1.00)
GFR calc Af Amer: >60 mL/min (ref >60)
GFR calc non Af Amer: >60 mL/min (ref >60)
Glucose, Bld: 153 mg/dL — ABNORMAL HIGH (ref 70–99)
Potassium: 3.5 mmol/L (ref 3.5–5.1)
Sodium: 141 mmol/L (ref 135–145)

## 2020-01-20 LAB — I-STAT CHEM 8, ED
BUN: 11 mg/dL (ref 6–20)
Calcium, Ion: 1.18 mmol/L (ref 1.15–1.40)
Chloride: 104 mmol/L (ref 98–111)
Creatinine, Ser: 1.1 mg/dL — ABNORMAL HIGH (ref 0.44–1.00)
Glucose, Bld: 154 mg/dL — ABNORMAL HIGH (ref 70–99)
HCT: 34 % — ABNORMAL LOW (ref 36.0–46.0)
Hemoglobin: 11.6 g/dL — ABNORMAL LOW (ref 12.0–15.0)
Potassium: 3.4 mmol/L — ABNORMAL LOW (ref 3.5–5.1)
Sodium: 143 mmol/L (ref 135–145)
TCO2: 24 mmol/L (ref 22–32)

## 2020-01-20 LAB — APTT: aPTT: 28 seconds (ref 24–36)

## 2020-01-20 LAB — I-STAT BETA HCG BLOOD, ED (MC, WL, AP ONLY): I-stat hCG, quantitative: <5 m[IU]/mL (ref <5)

## 2020-01-20 LAB — SARS CORONAVIRUS 2 BY RT PCR (HOSPITAL ORDER, PERFORMED IN ~~LOC~~ HOSPITAL LAB): SARS Coronavirus 2: NEGATIVE

## 2020-01-20 LAB — PROTIME-INR
INR: 1.1 (ref 0.8–1.2)
Prothrombin Time: 13.6 seconds (ref 11.4–15.2)

## 2020-01-20 LAB — TROPONIN I (HIGH SENSITIVITY): Troponin I (High Sensitivity): 16 ng/L (ref <18)

## 2020-01-20 IMAGING — CT CT HEAD W/O CM
4 series · 16 of 47 positions shown, 18 images · non-contrast
Comparison: [DATE] brain MRI

CLINICAL DATA: Stroke follow-up.

EXAM:
CT HEAD WITHOUT CONTRAST
TECHNIQUE: Contiguous axial images were obtained from the base of the skull
through the vertex without intravenous contrast.

[Series 3: head wo · axial · 0.39mm/px · z∈[+1265,+1385]mm · 7 of 33 slices shown, 9 images]
[im 5/33  brain]
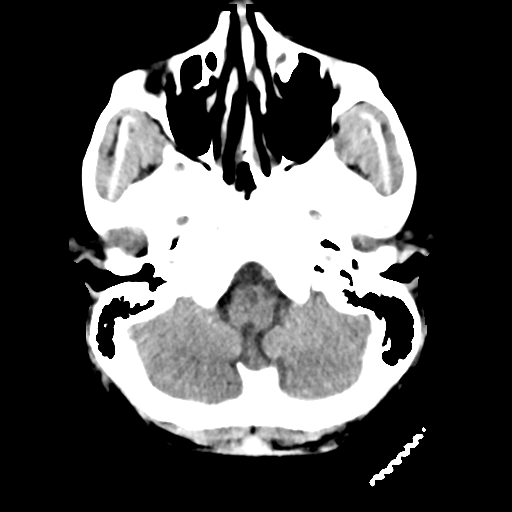
[im 5/33  bone]
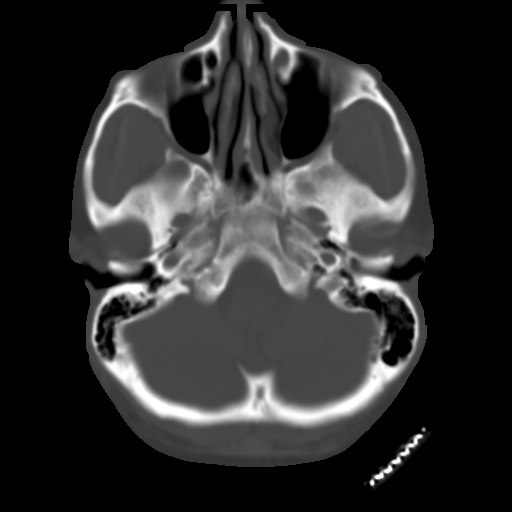
[im 9/33  brain]
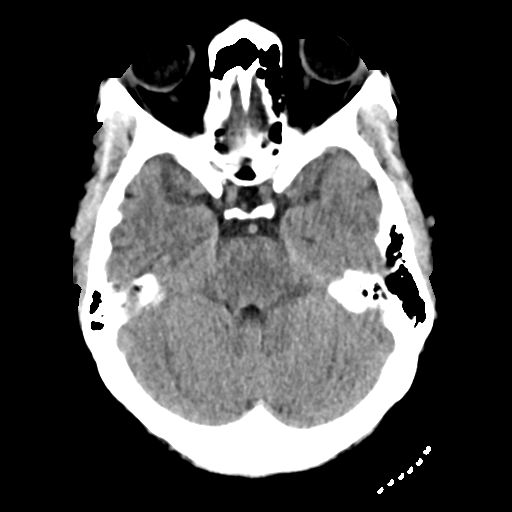
[im 13/33  brain]
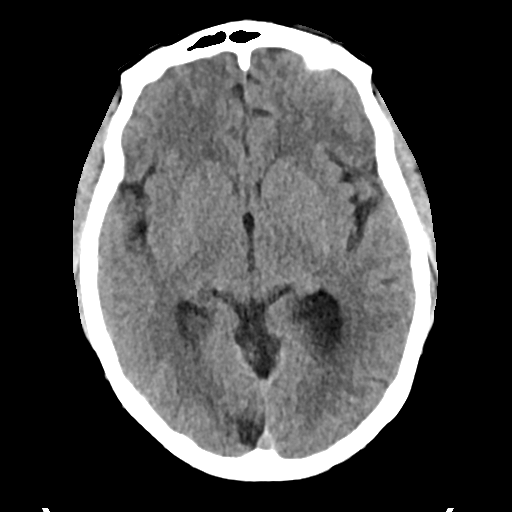
[im 17/33  brain]
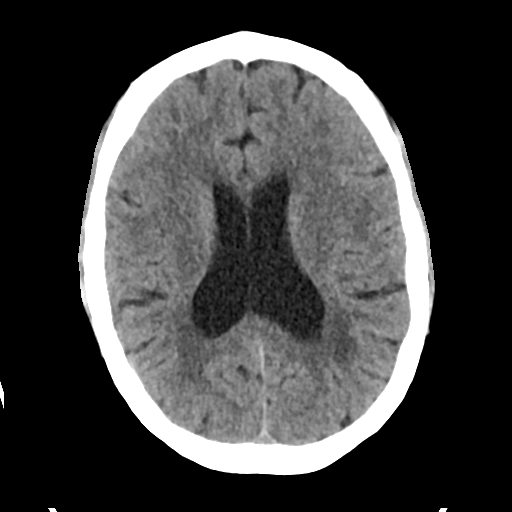
[im 21/33  brain]
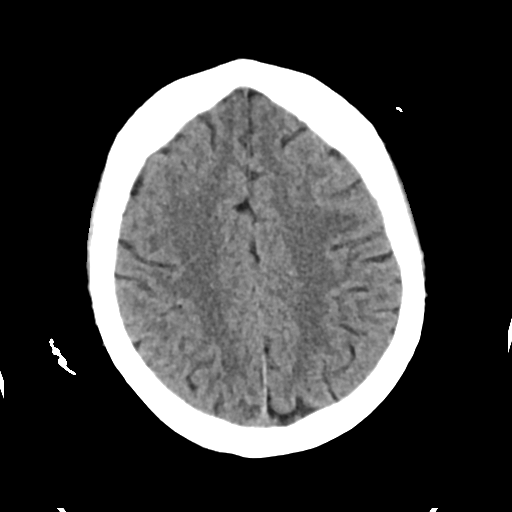
[im 21/33  bone]
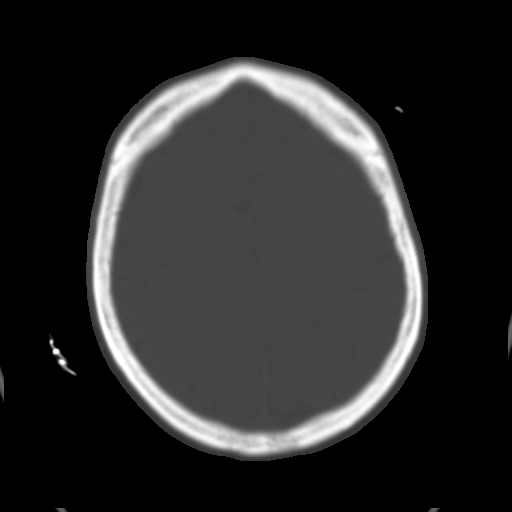
[im 25/33  brain]
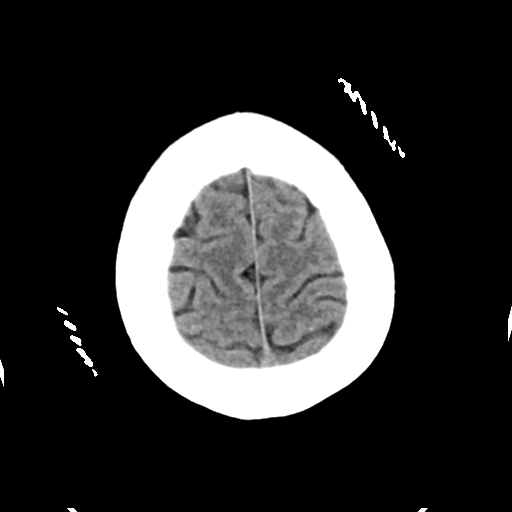
[im 29/33  brain]
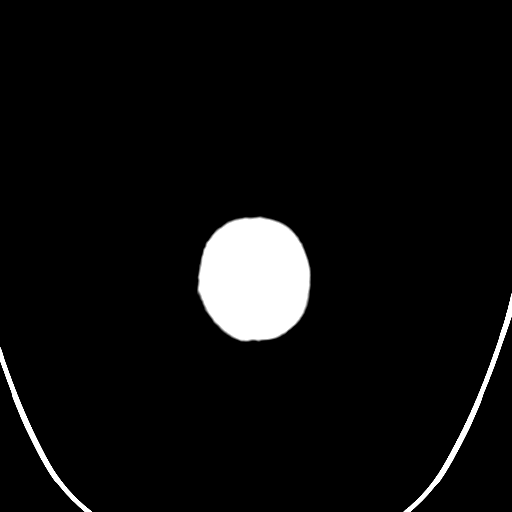

[Series 4: head bone · axial · 0.39mm/px · z∈[+1261,+1293]mm · 3 of 81 slices shown]
[im 9/81  bone]
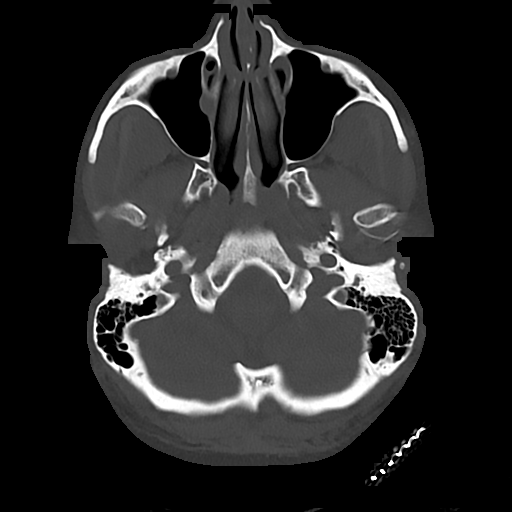
[im 17/81  bone]
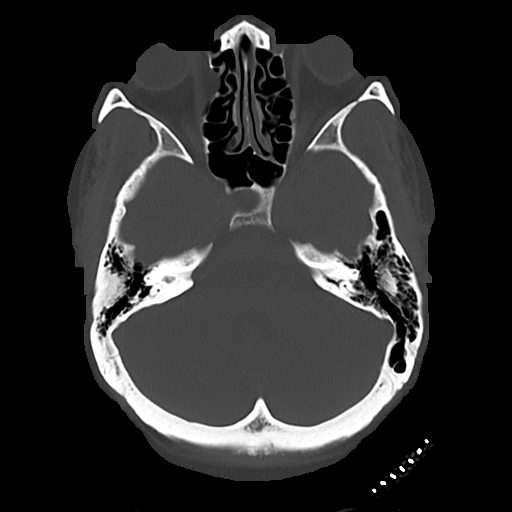
[im 25/81  bone]
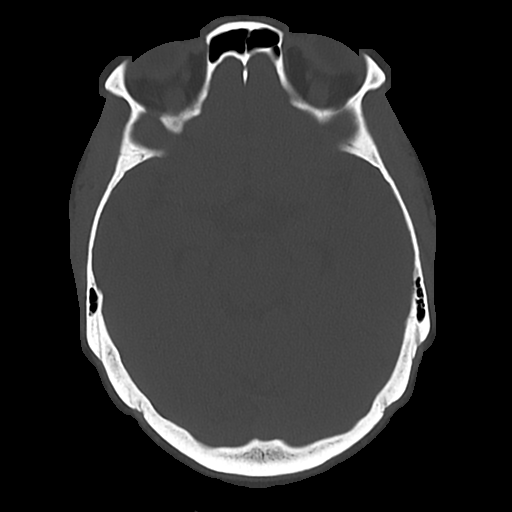

[Series 5: cor soft · coronal · 0.30mm/px · 3 of 70 slices shown]
[im 24/70  brain]
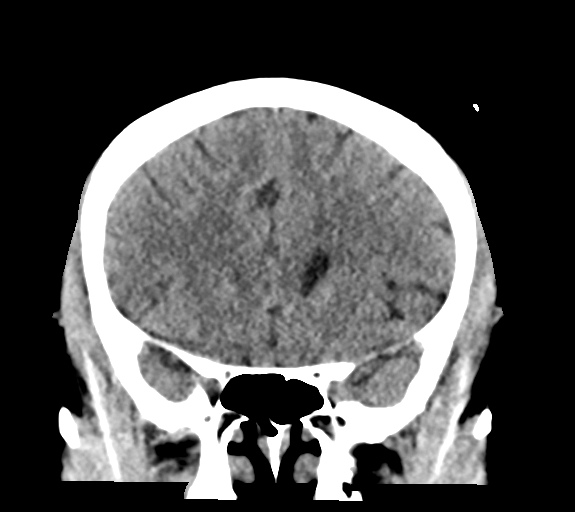
[im 31/70  brain]
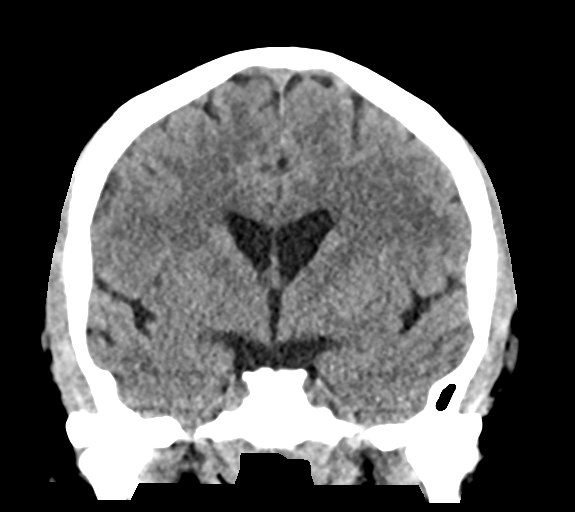
[im 39/70  brain]
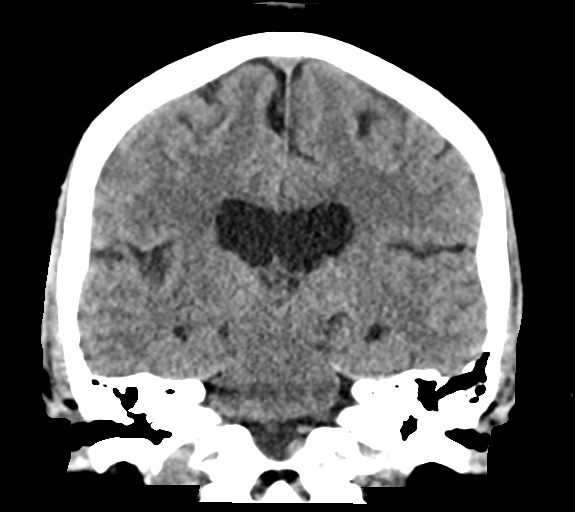

[Series 6: sag soft · sagittal · 0.28mm/px · 3 of 59 slices shown]
[im 20/59  brain]
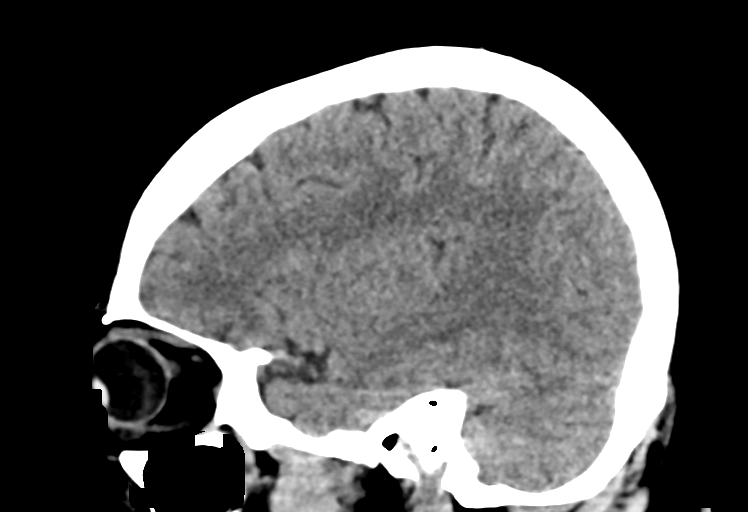
[im 30/59  brain]
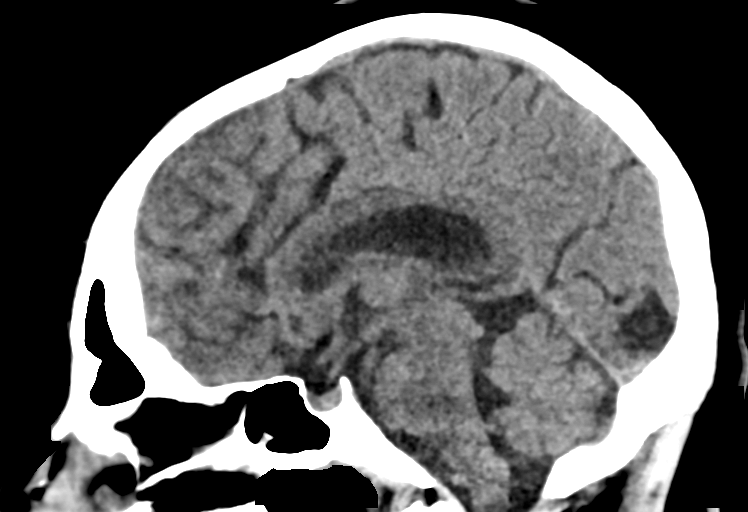
[im 39/59  brain]
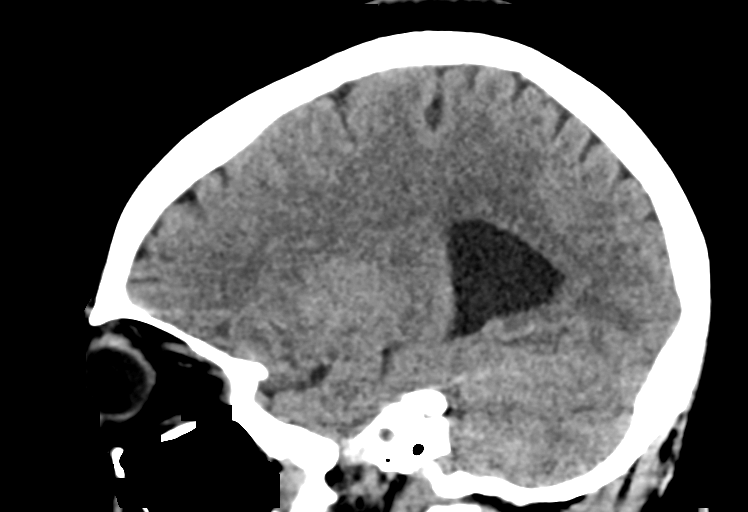

[16 of 47 positions shown; findings below may reference images not displayed]

FINDINGS: Brain: No evidence of acute infarction, hemorrhage, hydrocephalus,
extra-axial collection or mass lesion/mass effect. The low-density
right pontine and middle cerebellar peduncle lesion on prior brain
MRI is not detectable on this study. Small remote right occipital
cortex infarct.

Vascular: Negative.  There was recent CTA.

Skull: Normal. Negative for fracture or focal lesion.

Sinuses/Orbits: No acute finding.
IMPRESSION: 1. No acute finding.
2. Right brainstem and middle cerebellar peduncle abnormality on
recent brain MRI has not been readily detectable by CT.
3. Small remote right occipital cortex infarct.

## 2020-01-20 IMAGING — CR DG CHEST 2V
2 series · 2 of 2 positions shown · non-contrast
Comparison: [DATE]

CLINICAL DATA: Chest pain

EXAM:
CHEST - 2 VIEW

[chest lat]
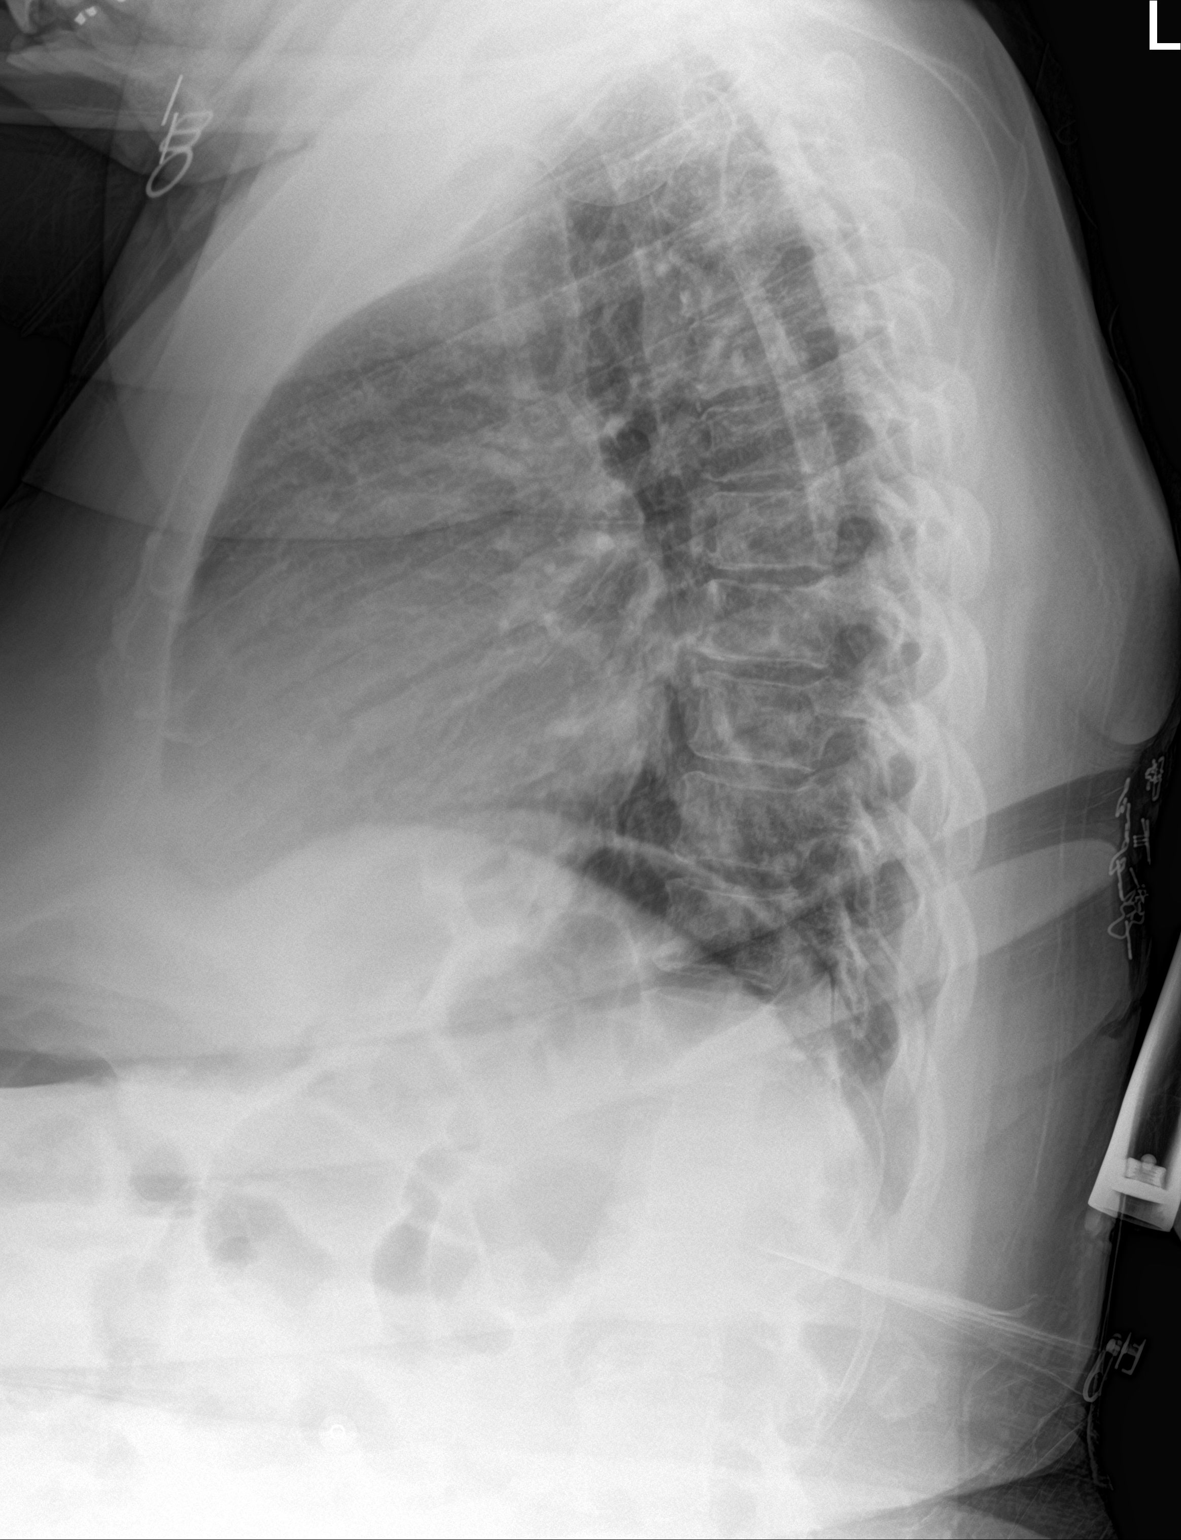

[chest ap]
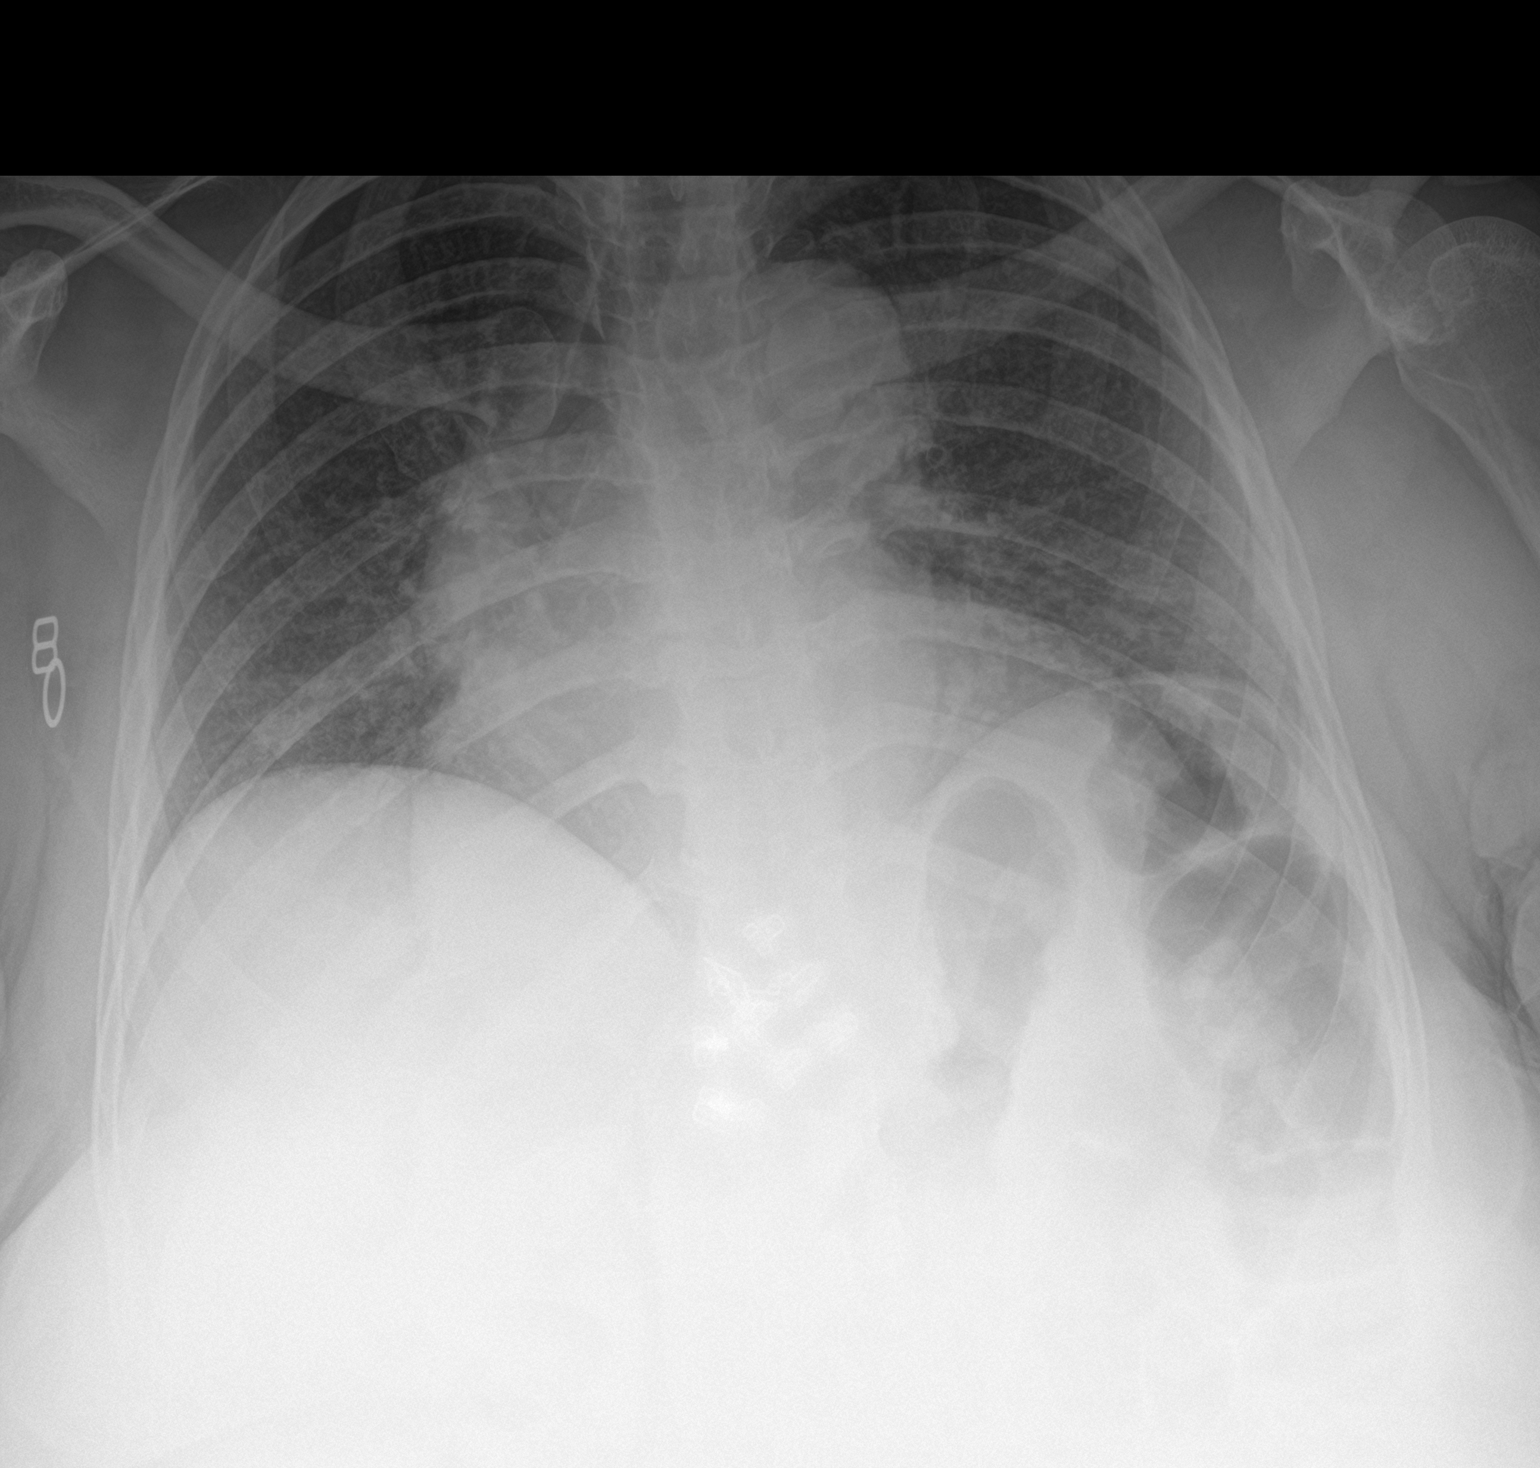

[2 of 2 positions shown; findings below may reference images not displayed]

FINDINGS: Shallow inspiration. Cardiac enlargement. No vascular congestion or
edema suggested. No focal consolidation. No pleural effusions. No
pneumothorax.
IMPRESSION: No active cardiopulmonary disease.

## 2020-01-20 MED ORDER — FERROUS SULFATE 325 (65 FE) MG PO TABS
325.0000 mg | ORAL_TABLET | Freq: Every day | ORAL | 0 refills | Status: DC
Start: 2020-01-20 — End: 2022-04-04

## 2020-01-20 MED ORDER — INSULIN ASPART 100 UNIT/ML ~~LOC~~ SOLN: 0.0000 [IU] | Freq: Three times a day (TID) | SUBCUTANEOUS | Status: DC

## 2020-01-20 MED ORDER — METFORMIN HCL 1000 MG PO TABS: 1000.0000 mg | ORAL_TABLET | Freq: Two times a day (BID) | ORAL | 0 refills | Status: DC

## 2020-01-20 MED ORDER — METFORMIN HCL 500 MG PO TABS
1000.0000 mg | ORAL_TABLET | Freq: Two times a day (BID) | ORAL | Status: DC
  Administered 2020-01-20: 1000 mg via ORAL
  Filled 2020-01-20: qty 2

## 2020-01-20 MED ORDER — SODIUM CHLORIDE 0.9% FLUSH: 3.0000 mL | Freq: Once | INTRAVENOUS | Status: DC

## 2020-01-20 MED ORDER — LISINOPRIL 10 MG PO TABS: 10.0000 mg | ORAL_TABLET | Freq: Every day | ORAL | 0 refills | Status: DC

## 2020-01-20 MED ORDER — INSULIN ASPART 100 UNIT/ML ~~LOC~~ SOLN: 0.0000 [IU] | Freq: Every day | SUBCUTANEOUS | Status: DC

## 2020-01-20 MED ORDER — INSULIN GLARGINE 100 UNIT/ML SOLOSTAR PEN: 45.0000 [IU] | PEN_INJECTOR | Freq: Every morning | SUBCUTANEOUS | Status: DC

## 2020-01-20 MED ORDER — INSULIN LISPRO 100 UNIT/ML (KWIKPEN): 0.0000 [IU] | PEN_INJECTOR | Freq: Three times a day (TID) | SUBCUTANEOUS | Status: DC

## 2020-01-20 MED ORDER — LISINOPRIL 10 MG PO TABS
10.0000 mg | ORAL_TABLET | Freq: Every day | ORAL | Status: DC
  Administered 2020-01-20: 10 mg via ORAL
  Filled 2020-01-20: qty 1

## 2020-01-20 NOTE — ED Notes (Signed)
Pt ambulated to bathroom independently

## 2020-01-20 NOTE — Progress Notes (Signed)
CSW met with Pt and boyfriend at bedside. CSW explained the issues that create barriers to SNF placement. CSW informed Pt and boyfriend that [facility] team would be setting up outpatient PT. Pt's boyfriend's mother's telephone will be the primary contact # for outpatient PT [phone] CSW confirmed with BFs mother CSW will put # in chart CSW provided transportation for d/c

## 2020-01-20 NOTE — ED Notes (Signed)
Dinner Tray Ordered @ [phone].

## 2020-01-20 NOTE — Care Management (Signed)
    Durable Medical Equipment  (From admission, onward)         Start     Ordered   01/20/20 2017  For home use only DME standard manual wheelchair with seat cushion  Once       Comments: Patient suffers from stroke which impairs their ability to perform daily activities like walking in the home.  A walker will not resolve issue with performing activities of daily living. A wheelchair will allow patient to safely perform daily activities. Patient can safely propel the wheelchair in the home or has a caregiver who can provide assistance. Length of need to be determined. Accessories: elevating leg rests (ELRs), wheel locks, extensions and anti-tippers.   01/20/20 2018

## 2020-01-20 NOTE — Evaluation (Signed)
Physical Therapy Evaluation Patient Details Name: Kaitlyn Gray MRN: [id number] DOB: 05-28-1976 Today's Date: 01/20/2020   History of Present Illness  Pt is a 44 y/o female presenting to ED following multiple falls. PMH includes CVA, DM, HTN, polysubstance abuse.   Clinical Impression  Pt presenting with problem above with deficits below. Pt lethargic, so limited to bed mobility. Was able to come to long sitting with mod A. Required min A for assist with rolling for bed pan placement. Pt reports multiple falls. Feel pt would benefit from SNF level therapies at d/c to increase independence and safety. Will continue to follow acutely to maximize functional mobility independence and safety.     Follow Up Recommendations SNF;Supervision/Assistance - 24 hour    Equipment Recommendations  Other (comment) (TBD)    Recommendations for Other Services       Precautions / Restrictions Precautions Precautions: Fall Restrictions Weight Bearing Restrictions: No      Mobility  Bed Mobility Overal bed mobility: Needs Assistance Bed Mobility: Supine to Sit;Rolling Rolling: Min assist   Supine to sit: Mod assist     General bed mobility comments: Pt lethargic, so unsafe to attempt OOB mobility with +1. Was able to come to long sitting with Mod A and use of L bed rail on stretcher. Required min A to roll for placement of bed pan. Cues for sequencing throughout.   Transfers                    Ambulation/Gait                Stairs            Wheelchair Mobility    Modified Rankin (Stroke Patients Only)       Balance                                             Pertinent Vitals/Pain Pain Assessment: No/denies pain    Home Living Family/patient expects to be discharged to:: Private residence Living Arrangements: Alone Available Help at Discharge: Friend(s);Available PRN/intermittently Type of Home: Apartment Home Access: Stairs to  enter Entrance Stairs-Rails: Left Entrance Stairs-Number of Steps: flight Home Layout: One level Home Equipment: None      Prior Function Level of Independence: Independent         Comments: REports she had been falling      Hand Dominance   Dominant Hand: Right    Extremity/Trunk Assessment   Upper Extremity Assessment Upper Extremity Assessment: RUE deficits/detail RUE Deficits / Details: RUE weakness at baseline secondary to CVA    Lower Extremity Assessment Lower Extremity Assessment: RLE deficits/detail RLE Deficits / Details: RLE deficits at baseline secondary to CVA.     Cervical / Trunk Assessment Cervical / Trunk Assessment: Normal  Communication   Communication: Expressive difficulties  Cognition Arousal/Alertness: Lethargic Behavior During Therapy: Flat affect Overall Cognitive Status: History of cognitive impairments - at baseline                                 General Comments: Cognitive deficits at baseline. Required safety cues throughout.       General Comments General comments (skin integrity, edema, etc.): Pt's boyfriend present     Exercises     Assessment/Plan    PT Assessment Patient  needs continued PT services  PT Problem List Decreased strength;Decreased activity tolerance;Decreased balance;Decreased mobility;Decreased coordination;Decreased cognition;Decreased knowledge of use of DME;Decreased safety awareness;Decreased knowledge of precautions       PT Treatment Interventions DME instruction;Gait training;Functional mobility training;Therapeutic activities;Therapeutic exercise;Balance training;Neuromuscular re-education;Cognitive remediation;Patient/family education    PT Goals (Current goals can be found in the Care Plan section)  Acute Rehab PT Goals Patient Stated Goal: to stop falling PT Goal Formulation: With patient Time For Goal Achievement: 02/03/20 Potential to Achieve Goals: Good    Frequency Min  2X/week   Barriers to discharge Decreased caregiver support      Co-evaluation               AM-PAC PT "6 Clicks" Mobility  Outcome Measure Help needed turning from your back to your side while in a flat bed without using bedrails?: A Little Help needed moving from lying on your back to sitting on the side of a flat bed without using bedrails?: A Lot Help needed moving to and from a bed to a chair (including a wheelchair)?: A Lot Help needed standing up from a chair using your arms (e.g., wheelchair or bedside chair)?: A Lot Help needed to walk in hospital room?: Total Help needed climbing 3-5 steps with a railing? : Total 6 Click Score: 11    End of Session Equipment Utilized During Treatment: Gait belt Activity Tolerance: Patient limited by lethargy Patient left: in bed;with call bell/phone within reach;with family/visitor present (on stretcher) Nurse Communication: Mobility status PT Visit Diagnosis: Unsteadiness on feet (R26.81);Muscle weakness (generalized) (M62.81);History of falling (Z91.81);Repeated falls (R29.6) Hemiplegia - Right/Left: Right Hemiplegia - dominant/non-dominant: Dominant Hemiplegia - caused by: Cerebral infarction    Time: 0454-0981 PT Time Calculation (min) (ACUTE ONLY): 16 min   Charges:   PT Evaluation $PT Eval Moderate Complexity: 1 Mod          Farley Ly, PT, DPT  Acute Rehabilitation Services  Pager: [phone] Office: [phone]   Lehman Prom 01/20/2020, 3:06 PM

## 2020-01-20 NOTE — NC FL2 (Addendum)
[place] LEVEL OF CARE SCREENING TOOL     IDENTIFICATION  Patient Name: Kaitlyn Gray Birthdate: 05-30-1976 Sex: female Admission Date (Current Location): 01/20/2020  [place] and [place] Number:  Kathleen Argue [id number] [place] and Address:  [facility] [place], [place] [address], [place], [place] [address]      Provider Number: [id number]  Attending Physician Name and Address:  Default, Provider, MD  Relative Name and Phone Number:  Milas Kocher Boyfriend [phone]    Current Level of Care: Hospital Recommended Level of Care: [place] Prior Approval Number:    Date Approved/Denied:   PASRR Number: Pending  Discharge Plan: SNF    Current Diagnoses: Patient Active Problem List   Diagnosis Date Noted  . Stroke ([place]) 01/01/2020  . Acute encephalopathy 05/12/2018  . Diabetes mellitus without complication ([place]) 48/25/0037  . Hypokalemia 12/06/2017  . Hypertension 12/06/2017  . 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 ([place]) 07/19/2017  . Bipolar disorder ([place]) 07/19/2017  . Polysubstance abuse ([place]) 07/19/2017  . Chest pain 07/19/2017  . Cocaine use disorder, severe, dependence ([place]) 03/24/2015  . Cannabis use disorder, moderate, dependence ([place]) 03/24/2015  . MDD (major depressive disorder), recurrent severe, without psychosis ([place]) 03/24/2015    Orientation RESPIRATION BLADDER Height & Weight     Self, Situation, Place  Normal Incontinent Weight: 191 lb (86.6 kg) Height:  '5\' 4"'  (162.6 cm)  BEHAVIORAL SYMPTOMS/MOOD NEUROLOGICAL BOWEL NUTRITION STATUS      Continent Diet (heart healthy)  AMBULATORY STATUS COMMUNICATION OF NEEDS Skin   Extensive Assist Verbally Normal                       Personal Care Assistance Level of Assistance  Bathing, Feeding, Dressing Bathing Assistance: Maximum assistance Feeding assistance: Limited assistance Dressing Assistance: Maximum  assistance     Functional Limitations Info  Sight, Hearing, Speech Sight Info: Adequate Hearing Info: Adequate Speech Info: Adequate    SPECIAL CARE FACTORS FREQUENCY  PT (By licensed PT), OT (By licensed OT)     PT Frequency: 5x weekly OT Frequency: 5x weekly            Contractures Contractures Info: Not present    Additional Factors Info  Code Status, Allergies Code Status Info: full Allergies Info: latex, hydrocodone           Current Medications (01/20/2020):  This is the current hospital active medication list Current Facility-Administered Medications  Medication Dose Route Frequency Provider Last Rate Last Admin  . insulin aspart (novoLOG) injection 0-5 Units  0-5 Units Subcutaneous QHS Daleen Bo, MD      . insulin aspart (novoLOG) injection 0-9 Units  0-9 Units Subcutaneous TID WC Daleen Bo, MD      . lisinopril (ZESTRIL) tablet 10 mg  10 mg Oral Daily Daleen Bo, MD   10 mg at 01/20/20 1348  . metFORMIN (GLUCOPHAGE) tablet 1,000 mg  1,000 mg Oral BID WC Daleen Bo, MD      . sodium chloride flush (NS) 0.9 % injection 3 mL  3 mL Intravenous Once Daleen Bo, MD       Current Outpatient Medications  Medication Sig Dispense Refill  . blood glucose meter kit and supplies KIT Dispense based on patient and insurance preference. Use up to four times daily as directed. (FOR ICD-9 250.00, 250.01). Please give 3 months supply of lancets and test strips to allow her to check QID. 1 each  0  . blood glucose meter kit and supplies Relion Prime or Dispense other brand based on patient and insurance preference. Use up to four times daily as directed. (FOR ICD-9 250.00, 250.01). 1 each 3  . ferrous sulfate 325 (65 FE) MG tablet Take 1 tablet (325 mg total) by mouth daily. (Patient not taking: Reported on 01/01/2020) 30 tablet 0  . hydrOXYzine (ATARAX/VISTARIL) 25 MG tablet Take 1 tablet (25 mg total) by mouth every 6 (six) hours as needed. (Patient not taking:  Reported on 01/01/2020) 30 tablet 0  . Insulin Glargine (LANTUS SOLOSTAR) 100 UNIT/ML Solostar Pen Inject 45 Units into the skin every morning. (Patient not taking: Reported on 12/16/2019) 15 mL 1  . insulin lispro (HUMALOG KWIKPEN) 100 UNIT/ML KiwkPen Inject 0-0.2 mLs (0-20 Units total) into the skin 4 (four) times daily -  before meals and at bedtime. Inject 0-20 Units into the skin 3 (three) times daily with meals. CBG 70 - 120: 0 units  CBG 121 - 150: 3 units  CBG 151 - 200: 4 units  CBG 201 - 250: 7 units  CBG 251 - 300: 11 units  CBG 301 - 350: 15 units  CBG 351 - 400: 20 units (Patient not taking: Reported on 12/16/2019) 15 mL 11  . Insulin Pen Needle 31G X 5 MM MISC Use as directed 100 each 2  . lisinopril (ZESTRIL) 10 MG tablet Take 1 tablet (10 mg total) by mouth daily. (Patient not taking: Reported on 01/01/2020) 30 tablet 0  . metFORMIN (GLUCOPHAGE) 1000 MG tablet Take 1 tablet (1,000 mg total) by mouth 2 (two) times daily with a meal. (Patient not taking: Reported on 01/01/2020) 60 tablet 0     Discharge Medications: Please see discharge summary for a list of discharge medications.  Relevant Imaging Results:  Relevant Lab Results:   Additional Information SSN: [id number]  Vergie Living, LCSW

## 2020-01-20 NOTE — Discharge Planning (Signed)
RNCM notes consult for skilled nursing facility placement (SNF) and  [place] with PT recommends SNF placement as disposition.  Licensed Clinical Social Worker is seeking post-discharge placement for this patient at the following level of care: SNF

## 2020-01-20 NOTE — ED Notes (Signed)
Pt given food and beverage per Dr. Effie Shy

## 2020-01-20 NOTE — Care Management (Signed)
ED CM forwarded  DME referral to [employer] for wheelchair. Explained to patient someone will contact her at the verified number present in chart.  OP Rehab referral also placed to [facility] street  Location.  Explained to patient by the ED CSW, CM unable to find [facility] agency to accept referral

## 2020-01-20 NOTE — ED Provider Notes (Signed)
Patient has been evaluated by physical therapy and social work.  Social worker informed him that the patient will not meet qualifications for nursing home placement from the emergency department.  At this time, available resources are a wheelchair delivered to the home to assist with reports of falls at home and difficulty with mobility.  Also, they will arrange outpatient physical therapy post CVA.  Patient is here with her boyfriend who has good mobility.  At this time patient is being discharged with social work assisting in Information systems manager. Physical Exam  BP (!) 154/97 (BP Location: Right Leg)   Pulse (!) 58   Temp (!) 97.5 F (36.4 C) (Oral)   Resp 16   Ht 5\' 4"  (1.626 m)   Wt 86.6 kg   SpO2 100%   BMI 32.79 kg/m   Physical Exam  ED Course/Procedures   Clinical Course as of Jan 19 2025  Fri Jan 20, 2020  0848 Normal  Troponin I (High Sensitivity) [EW]  [phone] Normal  I-Stat beta hCG blood, ED [EW]  0848 Normal except glucose high, creatinine high  Basic metabolic panel(!) [EW]  0848 Normal except hemoglobin low, RBC indices low  CBC(!) [EW]  0848 No except erythema, creatinine high, glucose high, hemoglobin low   [EW]  1250 I discussed the case with neuro hospitalist, Dr. [id number].  He reviewed the chart and it appears that the patient has had been required hospital work-up for stroke.  Since the patient's symptoms are unchanged, she could theoretically be discharged.  Unfortunately she does not have any of her current medications, and states that she has no one to help her at home, therefore will require nursing home placement.   [EW]    Clinical Course User Index [EW] Amada Jupiter, MD    Procedures  MDM        Mancel Bale, MD 01/20/20 2028

## 2020-01-20 NOTE — ED Notes (Signed)
Discharge instructions discussed with pt. Pt verbalized understanding. Pt stable and ambulatory. No signature pad available. 

## 2020-01-20 NOTE — ED Notes (Signed)
PTAR canceled for pt d/t pt ambulating independently. Pt and friend given bus passes. Pt states that if we send her home she's going to fall. Friend states that she lives on second floor and they are concerned about getting her up the stairs. RN asked how pt was getting up stairs before this ED admission. Friend states that she was ambulating. Pt states, "they don't care about me. I'm going to fall." EDP notified.

## 2020-01-20 NOTE — ED Notes (Signed)
Called to cancel ptar per nurse called by dee

## 2020-01-20 NOTE — ED Notes (Signed)
Now pt and visitor state that they cannot take bus home. They state that pt will fall walking from bus stop to home. Pt still ambulating in hospital independently. Social worker notified. Social worker giving cab voucher to pt

## 2020-01-20 NOTE — ED Triage Notes (Addendum)
Pt presents to ED POV. Pt c/o central CP. Pt cannot verbalize whether it is radiating and exact site. Per friend that is with pt she has been falling a lot. Pt admitted 3w ago for stroke and left AMA. Per friend w/ pt she has not been taking any of her medications since leaving AMA. Pt to ED multiple times for same since leaving AMA, LWBS

## 2020-01-20 NOTE — ED Provider Notes (Signed)
[facility] EMERGENCY DEPARTMENT Provider Note   CSN: [id number] Arrival date & time: 01/20/20  1191     History Chief Complaint  Patient presents with  . Chest Pain  . Stroke Symptoms    Kaitlyn Gray is a 44 y.o. female.  HPI She presents for evaluation of ongoing weakness, right side of the body making difficulty walking causing falling. She was hospitalized several weeks ago with an apparent stroke and left AMA prior to completion of treatment.  She was returned to the ED, twice but was not seen, leaving before evaluation both times.  She states that she lives alone.  She admitted to nursing that she ingested cocaine, yesterday.  She is unable to give additional history.  Level 5 caveat-altered mental status  Past Medical History:  Diagnosis Date  . Bipolar 1 disorder ([place])   . 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 Hypertension     Patient Active Problem List   Diagnosis Date Noted  . Stroke ([place]) 01/01/2020  . Acute encephalopathy 05/12/2018  . Diabetes mellitus without complication ([place]) 47/82/9562  . Hypokalemia 12/06/2017  . Hypertension 12/06/2017  . 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 ([place]) 07/19/2017  . Bipolar disorder ([place]) 07/19/2017  . Polysubstance abuse ([place]) 07/19/2017  . Chest pain 07/19/2017  . Cocaine use disorder, severe, dependence ([place]) 03/24/2015  . Cannabis use disorder, moderate, dependence ([place]) 03/24/2015  . MDD (major depressive disorder), recurrent severe, without psychosis ([place]) 03/24/2015    Past Surgical History:  Procedure Laterality Date  . CESAREAN SECTION       OB History   No obstetric history on file.     Family History  Problem Relation Age of Onset  . Hypertension Mother   . CAD Mother 35       died of MI at age 67  . Hypertension Father     Social History   Tobacco Use  . Smoking status: Current Every  Day Smoker    Packs/day: 0.50    Types: Cigarettes  . Smokeless tobacco: Never Used  Vaping Use  . Vaping Use: Never used  Substance Use Topics  . Alcohol use: No  . Drug use: Yes    Types: Marijuana, Cocaine    Home Medications Prior to Admission medications   Medication Sig Start Date End Date Taking? Authorizing Provider  blood glucose meter kit and supplies KIT Dispense based on patient and insurance preference. Use up to four times daily as directed. (FOR ICD-9 250.00, 250.01). Please give 3 months supply of lancets and test strips to allow her to check QID. 12/07/17   Roxan Hockey, MD  blood glucose meter kit and supplies Relion Prime or Dispense other brand based on patient and insurance preference. Use up to four times daily as directed. (FOR ICD-9 250.00, 250.01). 12/07/17   Roxan Hockey, MD  ferrous sulfate 325 (65 FE) MG tablet Take 1 tablet (325 mg total) by mouth daily. Patient not taking: Reported on 01/01/2020 12/19/19   Pattricia Boss, MD  hydrOXYzine (ATARAX/VISTARIL) 25 MG tablet Take 1 tablet (25 mg total) by mouth every 6 (six) hours as needed. Patient not taking: Reported on 01/01/2020 12/16/19   Sherwood Gambler, MD  Insulin Glargine (LANTUS SOLOSTAR) 100 UNIT/ML Solostar Pen Inject 45 Units into the skin every morning. Patient not taking: Reported on 12/16/2019 12/07/17   Roxan Hockey, MD  insulin lispro (HUMALOG KWIKPEN) 100 UNIT/ML KiwkPen  Inject 0-0.2 mLs (0-20 Units total) into the skin 4 (four) times daily -  before meals and at bedtime. Inject 0-20 Units into the skin 3 (three) times daily with meals. CBG 70 - 120: 0 units  CBG 121 - 150: 3 units  CBG 151 - 200: 4 units  CBG 201 - 250: 7 units  CBG 251 - 300: 11 units  CBG 301 - 350: 15 units  CBG 351 - 400: 20 units Patient not taking: Reported on 12/16/2019 12/07/17   Roxan Hockey, MD  Insulin Pen Needle 31G X 5 MM MISC Use as directed 12/07/17   Roxan Hockey, MD  lisinopril (ZESTRIL) 10 MG tablet  Take 1 tablet (10 mg total) by mouth daily. Patient not taking: Reported on 01/01/2020 12/16/19 01/15/20  Sherwood Gambler, MD  metFORMIN (GLUCOPHAGE) 1000 MG tablet Take 1 tablet (1,000 mg total) by mouth 2 (two) times daily with a meal. Patient not taking: Reported on 01/01/2020 12/16/19   Sherwood Gambler, MD    Allergies    Hydrocodone and Latex  Review of Systems   Review of Systems  All other systems reviewed and are negative.   Physical Exam Updated Vital Signs BP (!) 164/93   Pulse 65   Temp (!) 97.5 F (36.4 C) (Oral)   Resp 18   Ht '5\' 4"'  (1.626 m)   Wt 86.6 kg   SpO2 97%   BMI 32.79 kg/m   Physical Exam Vitals and nursing note reviewed.  Constitutional:      General: She is not in acute distress.    Appearance: She is well-developed. She is not ill-appearing, toxic-appearing or diaphoretic.  HENT:     Head: Normocephalic and atraumatic.     Right Ear: External ear normal.     Left Ear: External ear normal.  Eyes:     Conjunctiva/sclera: Conjunctivae normal.     Pupils: Pupils are equal, round, and reactive to light.  Neck:     Trachea: Phonation normal.  Cardiovascular:     Rate and Rhythm: Normal rate and regular rhythm.     Heart sounds: Normal heart sounds.  Pulmonary:     Effort: Pulmonary effort is normal. No respiratory distress.     Breath sounds: Normal breath sounds. No stridor.  Abdominal:     General: There is no distension.     Palpations: Abdomen is soft.     Tenderness: There is no abdominal tenderness.  Musculoskeletal:        General: Normal range of motion.     Cervical back: Normal range of motion and neck supple.     Comments: Right arm strength 3/5, right leg strength 2/5.  Skin:    General: Skin is warm and dry.  Neurological:     Mental Status: She is alert.     Cranial Nerves: No cranial nerve deficit.     Sensory: No sensory deficit.     Motor: No abnormal muscle tone.     Comments: Dysarthria is present.  Probable mild expressive  aphasia.  No nystagmus.  Weakness right face, right arm, right leg.  Psychiatric:        Attention and Perception: She is inattentive.        Mood and Affect: Mood is anxious.        Speech: She is communicative.        Cognition and Memory: Cognition is impaired.        Judgment: Judgment is inappropriate.  Comments: Mildly anxious, childlike affect with peculiar speech pattern for an adult.     ED Results / Procedures / Treatments   Labs (all labs ordered are listed, but only abnormal results are displayed) Labs Reviewed  BASIC METABOLIC PANEL - Abnormal; Notable for the following components:      Result Value   Glucose, Bld 153 (*)    Creatinine, Ser 1.04 (*)    All other components within normal limits  CBC - Abnormal; Notable for the following components:   Hemoglobin 9.9 (*)    HCT 33.4 (*)    MCH 24.5 (*)    MCHC 29.6 (*)    RDW 22.1 (*)    All other components within normal limits  I-STAT CHEM 8, ED - Abnormal; Notable for the following components:   Potassium 3.4 (*)    Creatinine, Ser 1.10 (*)    Glucose, Bld 154 (*)    Hemoglobin 11.6 (*)    HCT 34.0 (*)    All other components within normal limits  CBG MONITORING, ED - Abnormal; Notable for the following components:   Glucose-Capillary 156 (*)    All other components within normal limits  SARS CORONAVIRUS 2 BY RT PCR (HOSPITAL ORDER, [facility])  PROTIME-INR  APTT  I-STAT BETA HCG BLOOD, ED ([facility], [facility], [facility] ONLY)  TROPONIN I (HIGH SENSITIVITY)    EKG EKG Interpretation  Date/Time:  Friday January 20 2020 03:20:37 EDT Ventricular Rate:  74 PR Interval:  130 QRS Duration: 100 QT Interval:  436 QTC Calculation: 483 R Axis:   12 Text Interpretation: Normal sinus rhythm Moderate voltage criteria for LVH, may be normal variant ( R in aVL , Cornell product ) Possible Anterior infarct , age undetermined Abnormal ECG Nonspecific ST abnormality Inferior leads Otherwise no significant  change Confirmed by Daleen Bo [phone]) on 01/20/2020 8:50:21 AM   Radiology DG Chest 2 View  Result Date: 01/20/2020 CLINICAL DATA:  Chest pain EXAM: CHEST - 2 VIEW COMPARISON:  01/01/2020 FINDINGS: Shallow inspiration. Cardiac enlargement. No vascular congestion or edema suggested. No focal consolidation. No pleural effusions. No pneumothorax. IMPRESSION: No active cardiopulmonary disease. Electronically Signed   By: Lucienne Capers M.D.   On: 01/20/2020 03:37   CT Head Wo Contrast  Result Date: 01/20/2020 CLINICAL DATA:  Stroke follow-up. EXAM: CT HEAD WITHOUT CONTRAST TECHNIQUE: Contiguous axial images were obtained from the base of the skull through the vertex without intravenous contrast. COMPARISON:  01/02/2020 brain MRI FINDINGS: Brain: No evidence of acute infarction, hemorrhage, hydrocephalus, extra-axial collection or mass lesion/mass effect. The low-density right pontine and middle cerebellar peduncle lesion on prior brain MRI is not detectable on this study. Small remote right occipital cortex infarct. Vascular: Negative.  There was recent CTA. Skull: Normal. Negative for fracture or focal lesion. Sinuses/Orbits: No acute finding. IMPRESSION: 1. No acute finding. 2. Right brainstem and middle cerebellar peduncle abnormality on recent brain MRI has not been readily detectable by CT. 3. Small remote right occipital cortex infarct. Electronically Signed   By: Monte Fantasia M.D.   On: 01/20/2020 09:36    Procedures Procedures (including critical care time)  Medications Ordered in ED Medications  sodium chloride flush (NS) 0.9 % injection 3 mL (has no administration in time range)  lisinopril (ZESTRIL) tablet 10 mg (10 mg Oral Given 01/20/20 1348)  metFORMIN (GLUCOPHAGE) tablet 1,000 mg (has no administration in time range)  insulin aspart (novoLOG) injection 0-5 Units (has no administration in time range)  insulin aspart (novoLOG) injection 0-9 Units (has no administration in time  range)    ED Course  I have reviewed the triage vital signs and the nursing notes.  Pertinent labs & imaging results that were available during my care of the patient were reviewed by me and considered in my medical decision making (see chart for details).  Clinical Course as of Jan 20 1611  Fri Jan 20, 2020  0848 Normal  Troponin I (High Sensitivity) [EW]  [phone] Normal  I-Stat beta hCG blood, ED [EW]  0848 Normal except glucose high, creatinine high  Basic metabolic panel(!) [EW]  9390 Normal except hemoglobin low, RBC indices low  CBC(!) [EW]  0848 No except erythema, creatinine high, glucose high, hemoglobin low   [EW]  1250 I discussed the case with neuro hospitalist, Dr. Leonel Ramsay.  He reviewed the chart and it appears that the patient has had been required hospital work-up for stroke.  Since the patient's symptoms are unchanged, she could theoretically be discharged.  Unfortunately she does not have any of her current medications, and states that she has no one to help her at home, therefore will require nursing home placement.   [EW]    Clinical Course User Index [EW] Daleen Bo, MD   MDM Rules/Calculators/A&P                           Patient Vitals for the past 24 hrs:  BP Temp Temp src Pulse Resp SpO2 Height Weight  01/20/20 1502 -- -- -- 65 -- 97 % -- --  01/20/20 1500 (!) 164/93 -- -- 61 -- 100 % -- --  01/20/20 1443 -- -- -- 64 -- 96 % -- --  01/20/20 1430 (!) 163/95 -- -- -- -- -- -- --  01/20/20 1348 (!) 177/94 -- -- 64 18 100 % -- --  01/20/20 1232 (!) 152/94 -- -- 74 18 95 % -- --  01/20/20 1215 (!) 166/92 -- -- 80 18 95 % -- --  01/20/20 1108 -- -- -- 68 20 98 % -- --  01/20/20 1058 (!) 156/87 -- -- (!) 58 20 99 % -- --  01/20/20 1045 (!) 137/94 -- -- 73 (!) 25 99 % -- --  01/20/20 1001 (!) 157/99 -- -- 75 17 98 % -- --  01/20/20 0847 (!) 166/94 -- -- (!) 52 17 100 % -- --  01/20/20 0624 (!) 136/100 (!) 97.5 F (36.4 C) Oral 67 17 100 % -- --   01/20/20 0325 (!) 167/89 97.6 F (36.4 C) Oral 78 19 98 % '5\' 4"'  (1.626 m) 86.6 kg    12:21 PM Reevaluation with update and discussion. After initial assessment and treatment, an updated evaluation reveals no change in clinical status at this time.  She is hungry. Daleen Bo   Medical Decision Making:  This patient is presenting for evaluation of weakness and falling, which does require a range of treatment options, and is a complaint that involves a moderate risk of morbidity and mortality. The differential diagnoses include stable CVA symptoms, new CVA, acute illness. I decided to review old records, and in summary patient diagnosed with left brain stroke, 3 weeks ago, left AMA prior to being placed into a skilled nursing facility..  I did not require additional historical information from anyone Clinical Laboratory Tests Ordered, included CBC, Metabolic panel and Troponin. Review indicates normal except for mild elevation of creatinine and glucose. Radiologic Tests Ordered,  included head CT, chest x-ray.  I independently Visualized: Radiographic images, which show stable right brain stroke no acute pulmonary infections or edema  Cardiac Monitor Tracing which shows normal sinus rhythm   Critical Interventions-clinical evaluation, laboratory testing, CT imaging, chest x-ray, observation reassessment  After These Interventions, the Patient was reevaluated and was found to be stable.  She requires placement in skilled nursing facility for ongoing symptoms of weakness and difficulty walking without falling.  Patient's neurological status is unchanged from that of when she was discharged, doubt extended stroke or new stroke.  Have requested PT consultation for assessment of current functional level, and social work consultation for placement in a skilled nursing facility/rehab unit  CRITICAL CARE-no Performed by: Daleen Bo  Nursing Notes Reviewed/ Care Coordinated Applicable Imaging  Reviewed Interpretation of Laboratory Data incorporated into ED treatment   Plan-patient will reside in the ED as a border until she can be placed following PT evaluation.    Final Clinical Impression(s) / ED Diagnoses Final diagnoses:  Cerebrovascular accident (CVA), unspecified mechanism ([place])    Rx / [place] Orders ED Discharge Orders    None       Daleen Bo, MD 01/20/20 [id number]

## 2020-01-20 NOTE — Discharge Instructions (Signed)
1.  The social worker is helping you get set up for outpatient physical therapy for your stroke symptoms. 2.  A wheelchair will be delivered to your home to help with mobility at home. 3.  You must follow-up with a family doctor as soon as possible.  If you do not have a family doctor or a identified medical provider after your hospitalization, you may go to [facility] community health and wellness and get established as a patient, you may also use the resource guide included in your discharge instructions for community resources and clinics.

## 2020-02-01 ENCOUNTER — Emergency Department (HOSPITAL_COMMUNITY): Payer: Medicaid Other

## 2020-02-01 ENCOUNTER — Emergency Department (HOSPITAL_COMMUNITY)
Admission: EM | Admit: 2020-02-01 | Discharge: 2020-02-02 | Disposition: A | Discharge status: Home / Self Care | Payer: Medicaid Other | Attending: Emergency Medicine | Admitting: Emergency Medicine

## 2020-02-01 DIAGNOSIS — R569 Unspecified convulsions: Secondary | ICD-10-CM

## 2020-02-01 DIAGNOSIS — F445 Conversion disorder with seizures or convulsions: Secondary | ICD-10-CM

## 2020-02-01 DIAGNOSIS — R55 Syncope and collapse: Secondary | ICD-10-CM

## 2020-02-01 LAB — COMPREHENSIVE METABOLIC PANEL
ALT: 18 U/L (ref 0–44)
AST: 18 U/L (ref 15–41)
Albumin: 3.7 g/dL (ref 3.5–5.0)
Alkaline Phosphatase: 72 U/L (ref 38–126)
Anion gap: 7 (ref 5–15)
BUN: 7 mg/dL (ref 6–20)
CO2: 26 mmol/L (ref 22–32)
Calcium: 9.2 mg/dL (ref 8.9–10.3)
Chloride: 107 mmol/L (ref 98–111)
Creatinine, Ser: 1.12 mg/dL — ABNORMAL HIGH (ref 0.44–1.00)
GFR calc Af Amer: >60 mL/min (ref >60)
GFR calc non Af Amer: 59 mL/min — ABNORMAL LOW (ref >60)
Glucose, Bld: 114 mg/dL — ABNORMAL HIGH (ref 70–99)
Potassium: 3.4 mmol/L — ABNORMAL LOW (ref 3.5–5.1)
Sodium: 140 mmol/L (ref 135–145)
Total Bilirubin: 0.7 mg/dL (ref 0.3–1.2)
Total Protein: 7.2 g/dL (ref 6.5–8.1)

## 2020-02-01 LAB — CBC WITH DIFFERENTIAL/PLATELET
Abs Immature Granulocytes: 0.02 10*3/uL (ref 0.00–0.07)
Basophils Absolute: 0 10*3/uL (ref 0.0–0.1)
Basophils Relative: 1 %
Eosinophils Absolute: 0 10*3/uL (ref 0.0–0.5)
Eosinophils Relative: 1 %
HCT: 37.4 % (ref 36.0–46.0)
Hemoglobin: 11.2 g/dL — ABNORMAL LOW (ref 12.0–15.0)
Immature Granulocytes: 0 %
Lymphocytes Relative: 28 %
Lymphs Abs: 1.5 10*3/uL (ref 0.7–4.0)
MCH: 25.5 pg — ABNORMAL LOW (ref 26.0–34.0)
MCHC: 29.9 g/dL — ABNORMAL LOW (ref 30.0–36.0)
MCV: 85 fL (ref 80.0–100.0)
Monocytes Absolute: 0.4 10*3/uL (ref 0.1–1.0)
Monocytes Relative: 6 %
Neutro Abs: 3.5 10*3/uL (ref 1.7–7.7)
Neutrophils Relative %: 64 %
Platelets: 306 10*3/uL (ref 150–400)
RBC: 4.4 MIL/uL (ref 3.87–5.11)
RDW: 21 % — ABNORMAL HIGH (ref 11.5–15.5)
WBC: 5.5 10*3/uL (ref 4.0–10.5)
nRBC: 0 % (ref 0.0–0.2)

## 2020-02-01 LAB — URINALYSIS, ROUTINE W REFLEX MICROSCOPIC
Bilirubin Urine: NEGATIVE
Glucose, UA: NEGATIVE mg/dL
Hgb urine dipstick: NEGATIVE
Ketones, ur: NEGATIVE mg/dL
Leukocytes,Ua: NEGATIVE
Nitrite: NEGATIVE
Protein, ur: NEGATIVE mg/dL
Specific Gravity, Urine: 1.016 (ref 1.005–1.030)
pH: 6 (ref 5.0–8.0)

## 2020-02-01 LAB — ETHANOL: Alcohol, Ethyl (B): <10 mg/dL (ref <10)

## 2020-02-01 LAB — RAPID URINE DRUG SCREEN, HOSP PERFORMED
Amphetamines: NOT DETECTED
Barbiturates: NOT DETECTED
Benzodiazepines: NOT DETECTED
Cocaine: POSITIVE — AB
Opiates: NOT DETECTED
Tetrahydrocannabinol: NOT DETECTED

## 2020-02-01 LAB — CBG MONITORING, ED: Glucose-Capillary: 105 mg/dL — ABNORMAL HIGH (ref 70–99)

## 2020-02-01 LAB — AMMONIA: Ammonia: 24 umol/L (ref 9–35)

## 2020-02-01 IMAGING — CT CT CERVICAL SPINE W/O CM
3 of 4 series · 13 of 33 positions shown, 16 images · non-contrast
Comparison: [DATE]

CLINICAL DATA: Syncope, status post fall.

EXAM:
CT CERVICAL SPINE WITHOUT CONTRAST
TECHNIQUE: Multidetector CT imaging of the cervical spine was performed without
intravenous contrast. Multiplanar CT image reconstructions were also
generated.

[Series 4: c_spine 2.0 st · axial · 0.24mm/px · z∈[-323,-207]mm · 5 of 88 slices shown, 7 images]
[im 15/88  soft-tissue]
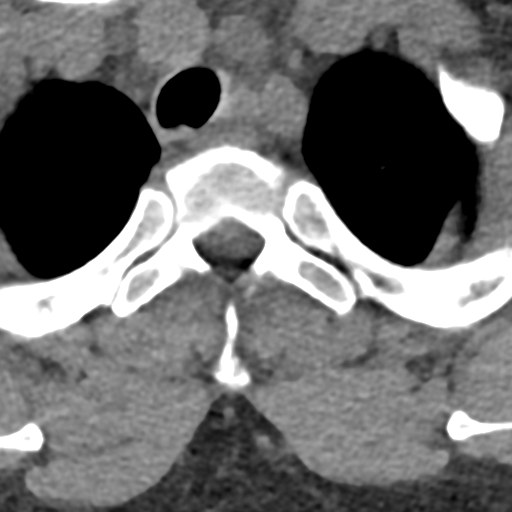
[im 15/88  bone]
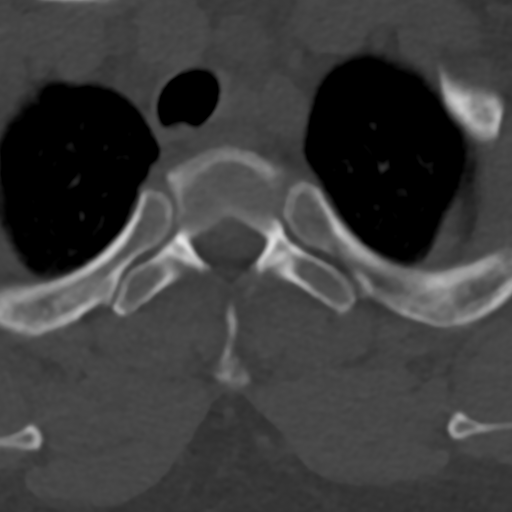
[im 30/88  bone]
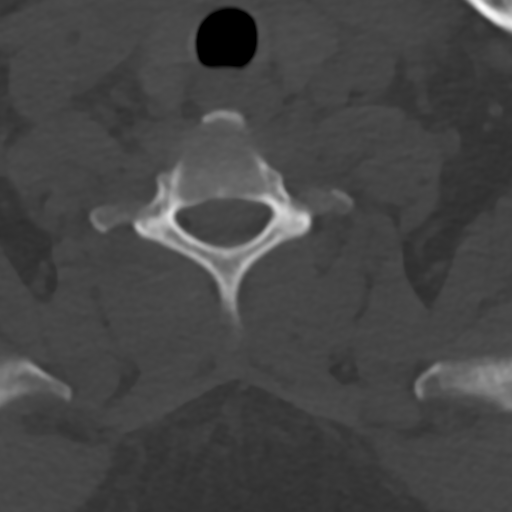
[im 44/88  bone]
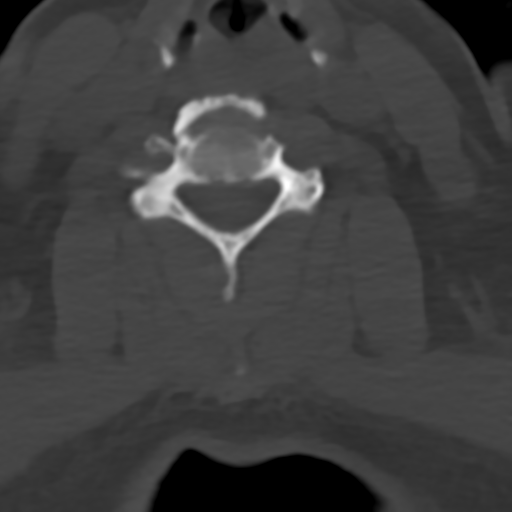
[im 59/88  bone]
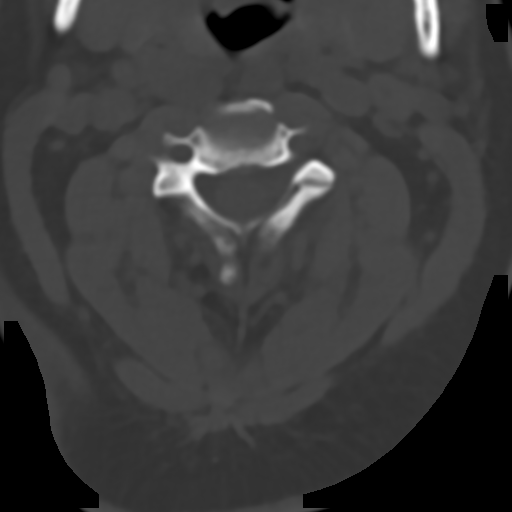
[im 73/88  soft-tissue]
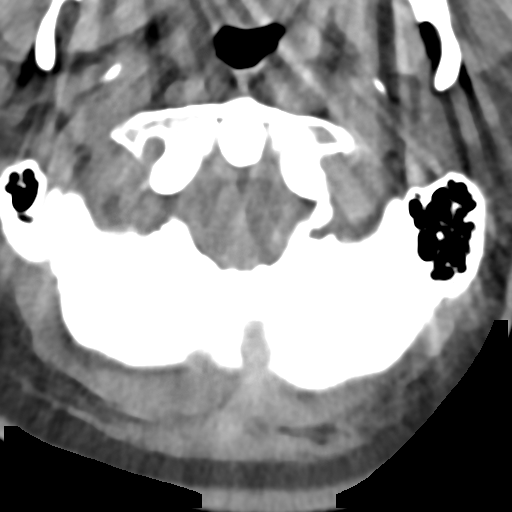
[im 73/88  bone]
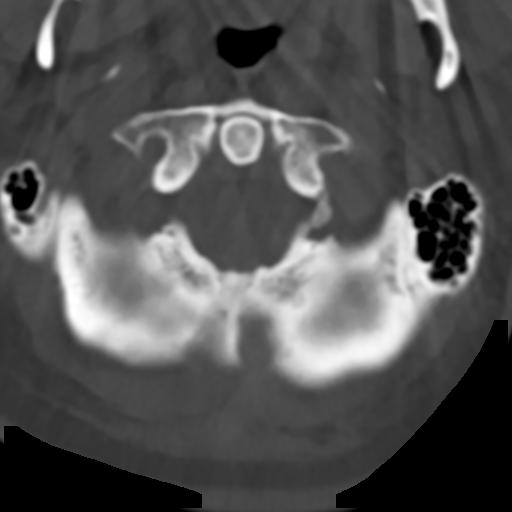

[Series 9: c_spine 2.0 sag bone · sagittal · 0.26mm/px · 5 of 61 slices shown, 6 images]
[im 21/61  bone]
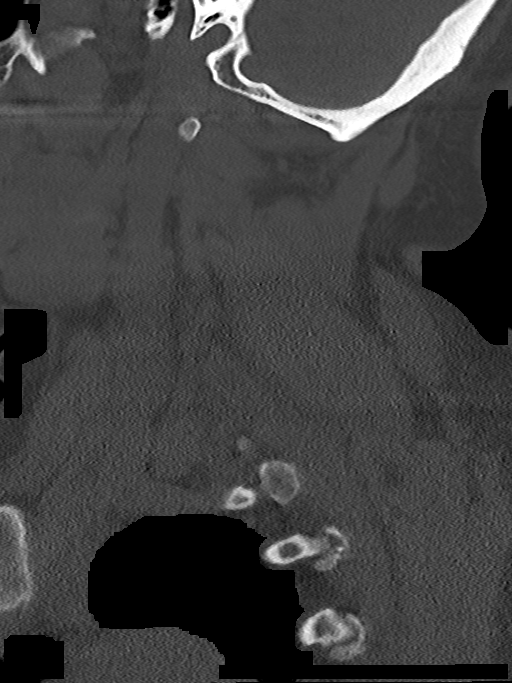
[im 26/61  bone]
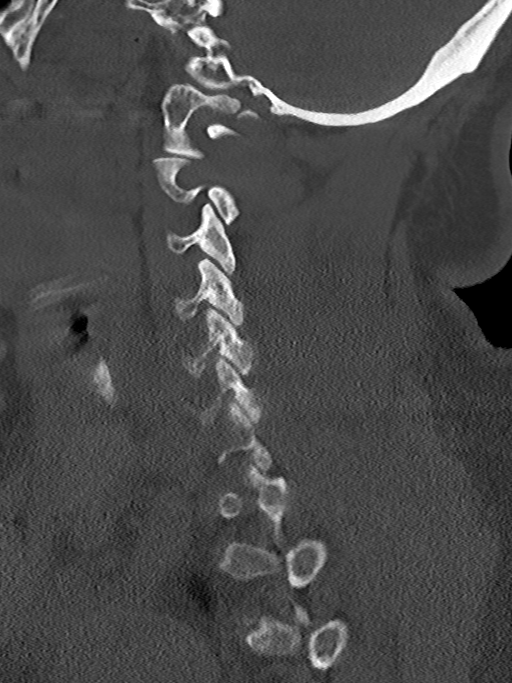
[im 31/61  soft-tissue]
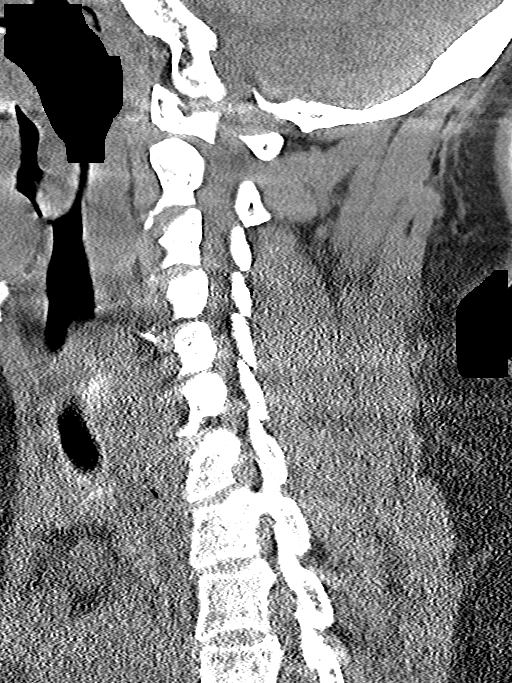
[im 31/61  bone]
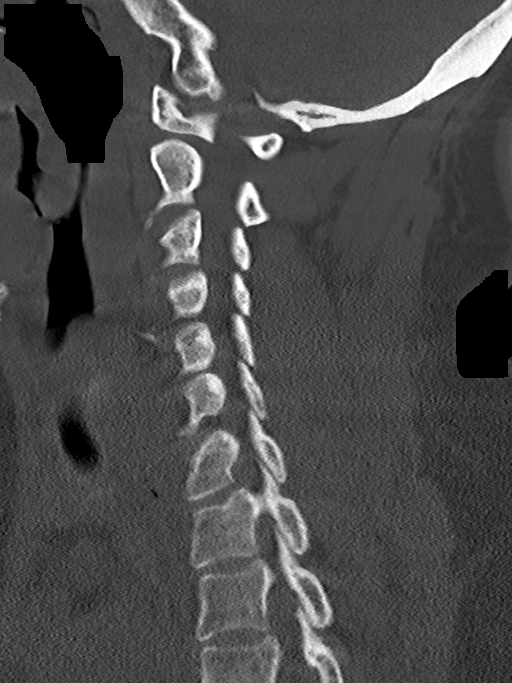
[im 36/61  bone]
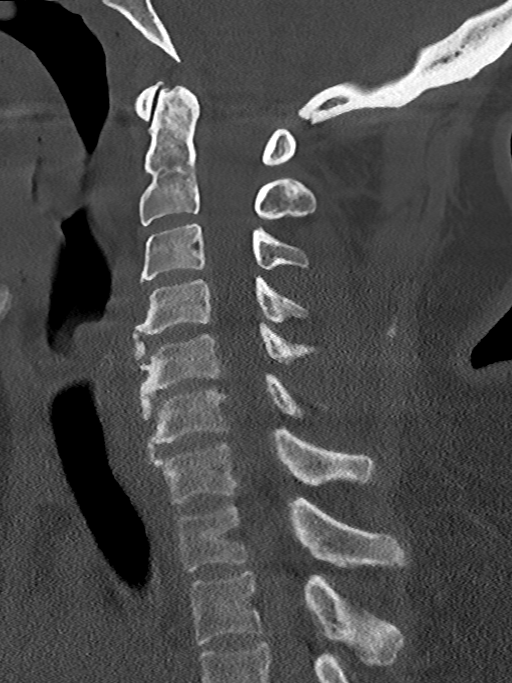
[im 41/61  bone]
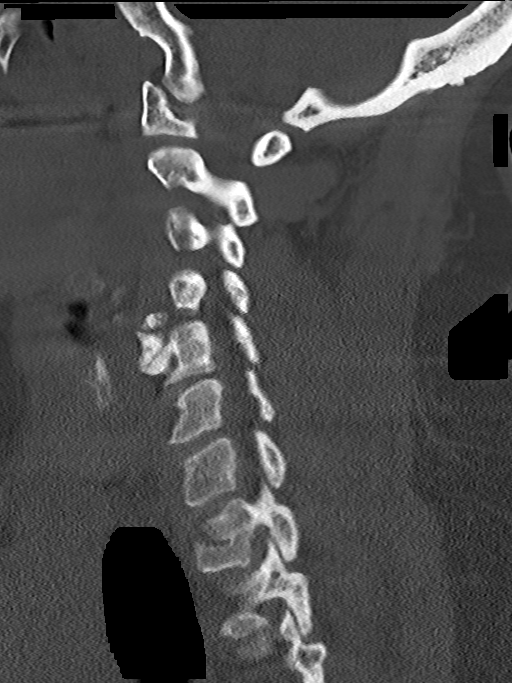

[Series 10: c_spine 2.0 cor bone · coronal · 0.22mm/px · 3 of 61 slices shown]
[im 13/61  bone]
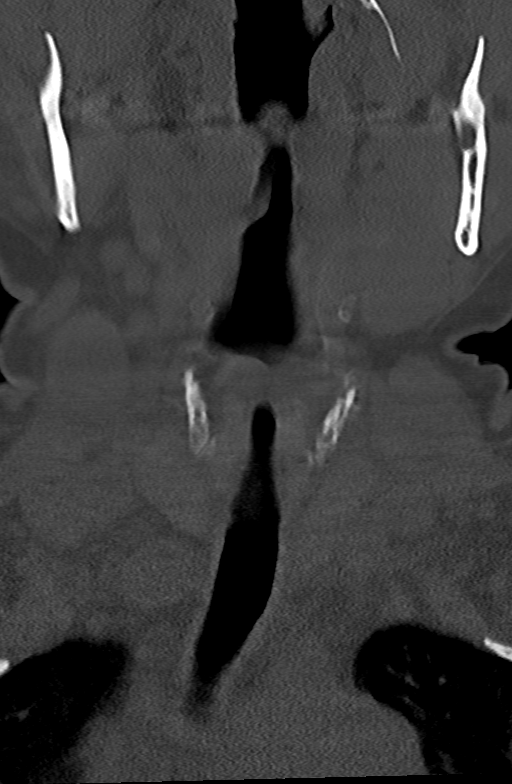
[im 25/61  bone]
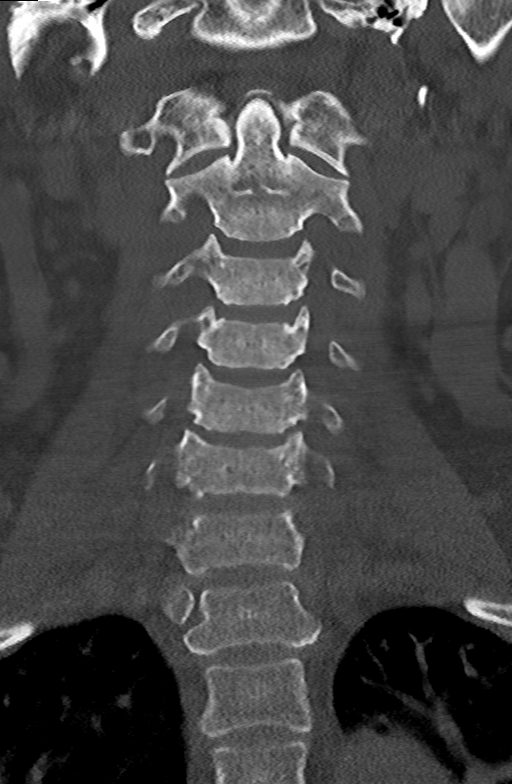
[im 37/61  bone]
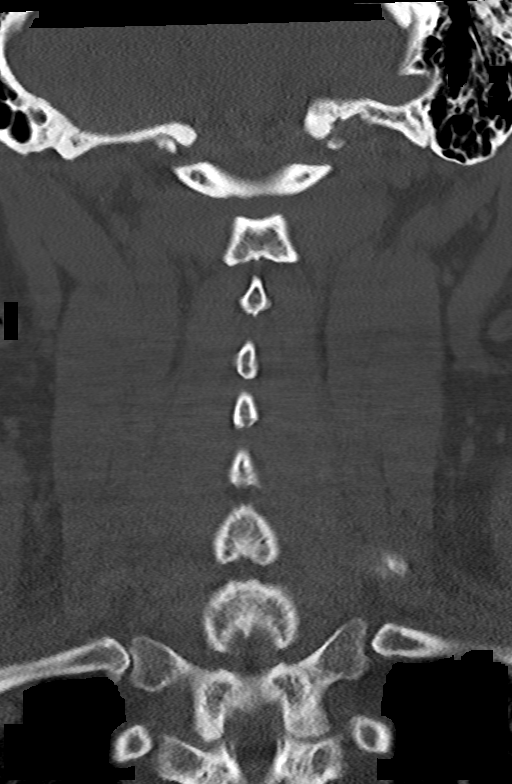

[13 of 33 positions shown; findings below may reference images not displayed]

FINDINGS: Alignment: Normal.

Skull base and vertebrae: No acute fracture. No primary bone lesion
or focal pathologic process.

Soft tissues and spinal canal: No prevertebral fluid or swelling. No
visible canal hematoma.

Disc levels: Moderate to marked severity anterior osteophyte
formation is seen at the levels of C4-C5, C5-C6 and C6-C7. Mild
anterior osteophyte formation is seen at the levels of C2-C3 and
C3-C4.

Very mild multilevel intervertebral disc space narrowing is seen.

Normal multilevel bilateral facet joints are noted.

Upper chest: Negative.

Other: None.
IMPRESSION: 1. No acute fracture within the cervical spine.
2. Moderate to marked severity degenerative changes at the levels of
C4-C5, C5-C6 and C6-C7.
3. Very mild multilevel intervertebral disc space narrowing is seen
at the levels of C2-C3 and C3-C4.

## 2020-02-01 IMAGING — CT CT HEAD W/O CM
4 series · 16 of 47 positions shown, 18 images · non-contrast
Comparison: [DATE]

CLINICAL DATA: Syncope with fall.

EXAM:
CT HEAD WITHOUT CONTRAST
TECHNIQUE: Contiguous axial images were obtained from the base of the skull
through the vertex without intravenous contrast.

[Series 3: head without · axial · non-contrast · 0.43mm/px · z∈[-169,-49]mm · 7 of 34 slices shown, 9 images]
[im 5/34  brain]
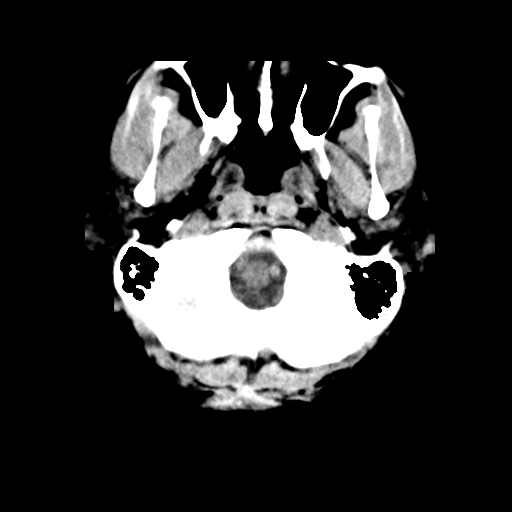
[im 5/34  bone]
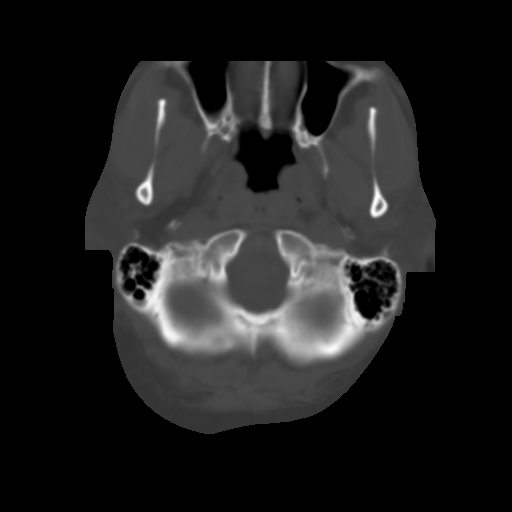
[im 9/34  brain]
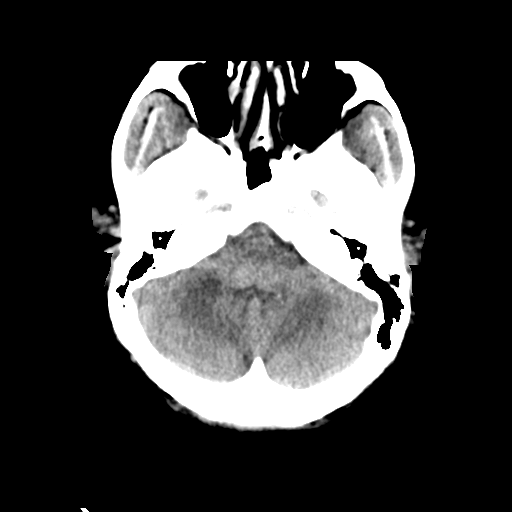
[im 13/34  brain]
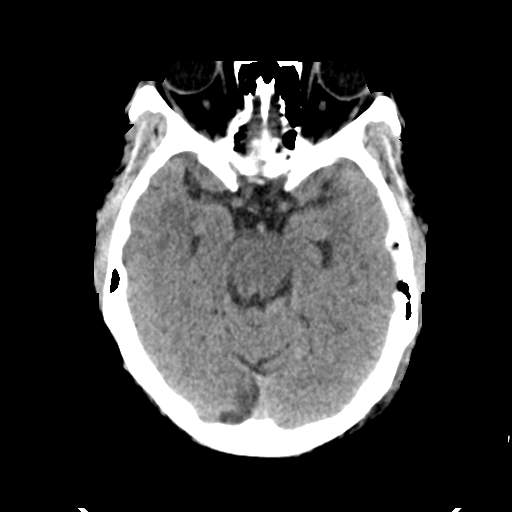
[im 17/34  brain]
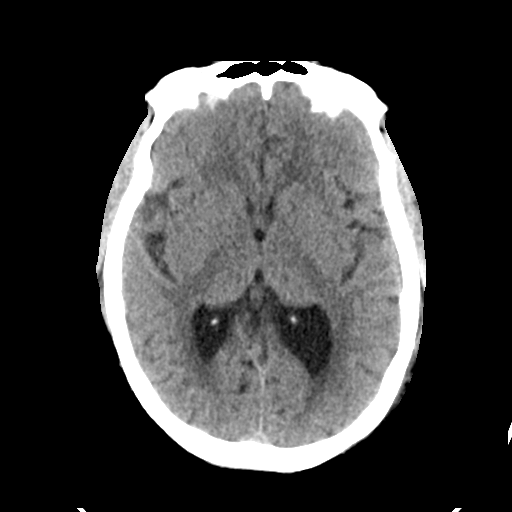
[im 21/34  brain]
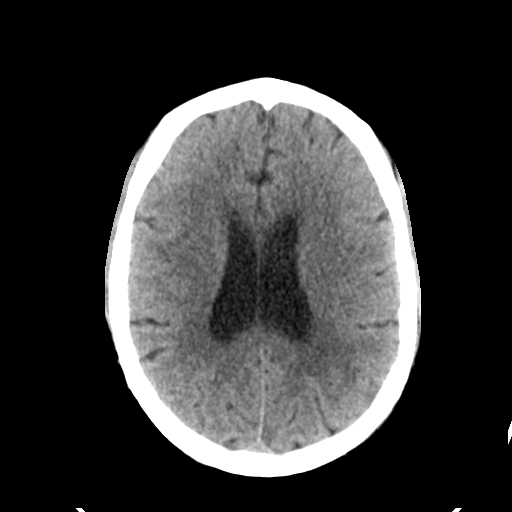
[im 21/34  bone]
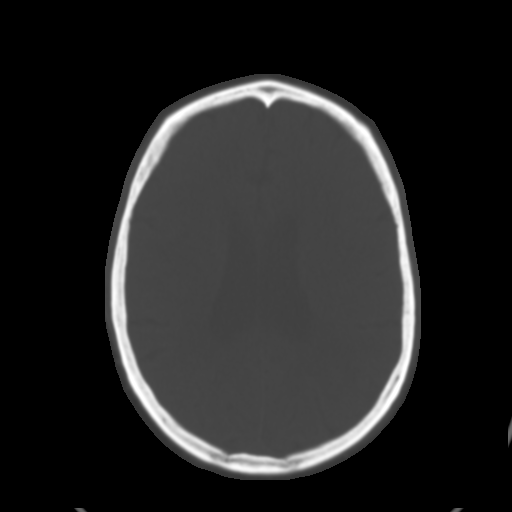
[im 25/34  brain]
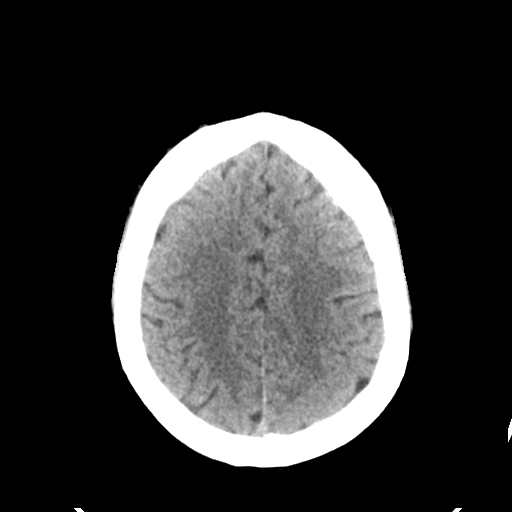
[im 29/34  brain]
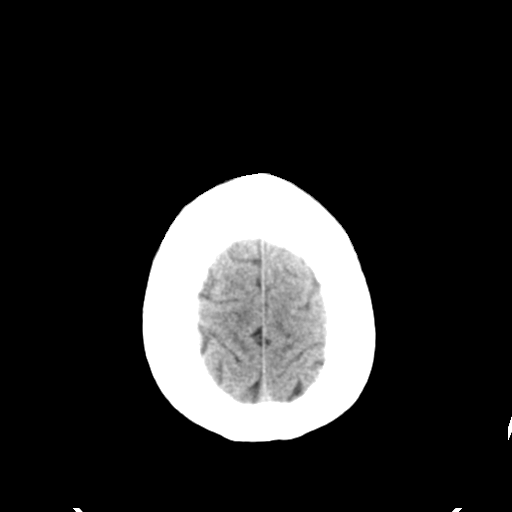

[Series 4: head bone · axial · 0.43mm/px · z∈[-173,-141]mm · 3 of 84 slices shown]
[im 9/84  bone]
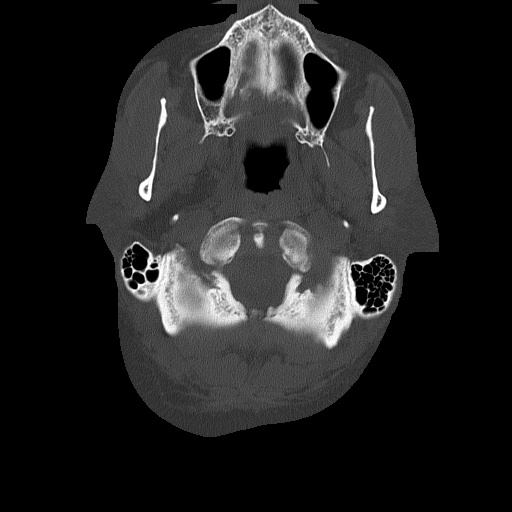
[im 17/84  bone]
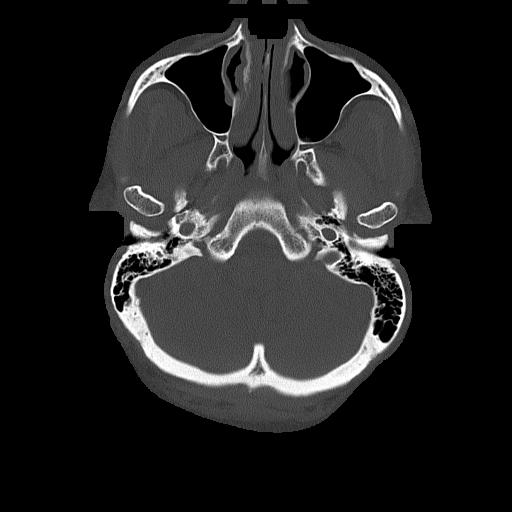
[im 25/84  bone]
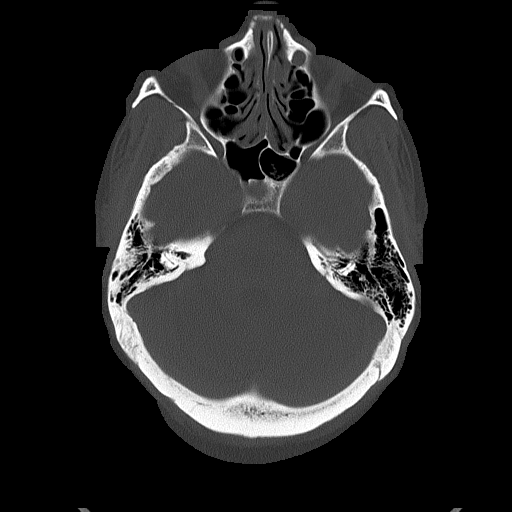

[Series 5: head without cor · coronal · non-contrast · 0.33mm/px · 3 of 67 slices shown]
[im 23/67  brain]
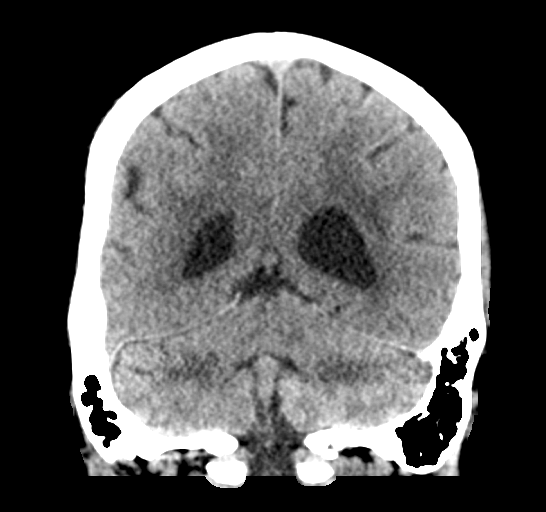
[im 30/67  brain]
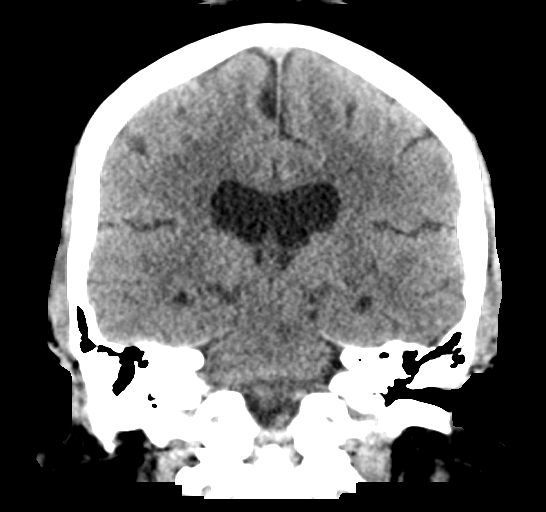
[im 37/67  brain]
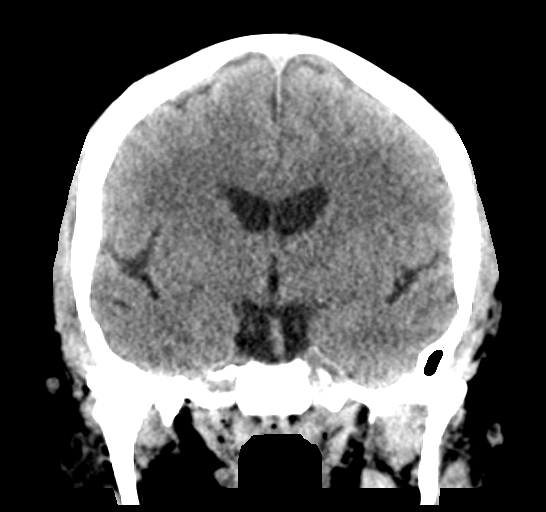

[Series 6: head without sag · sagittal · non-contrast · 0.33mm/px · 3 of 55 slices shown]
[im 19/55  brain]
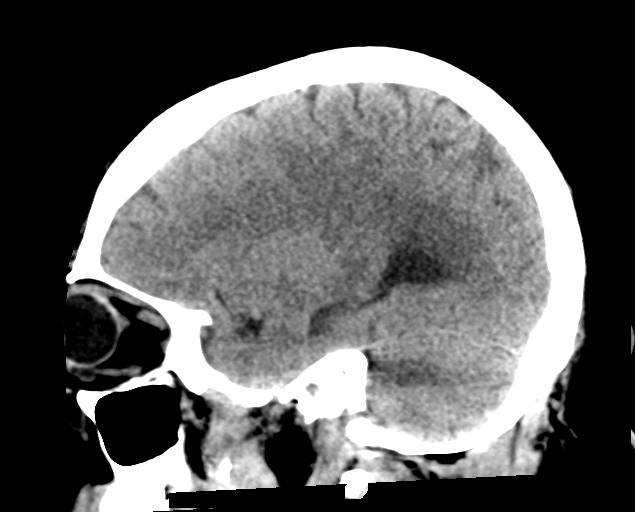
[im 28/55  brain]
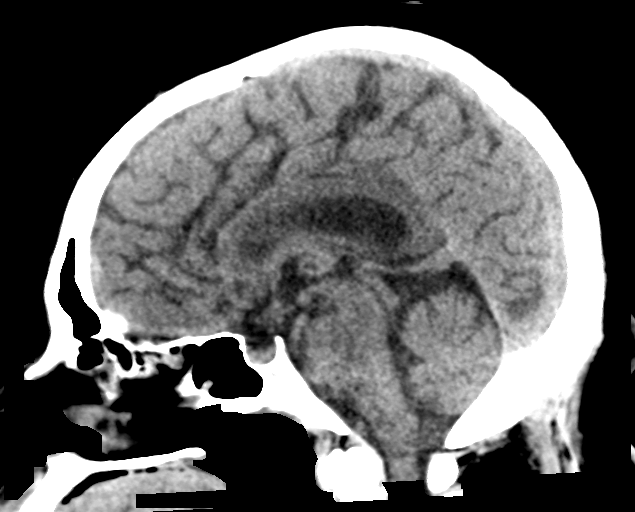
[im 37/55  brain]
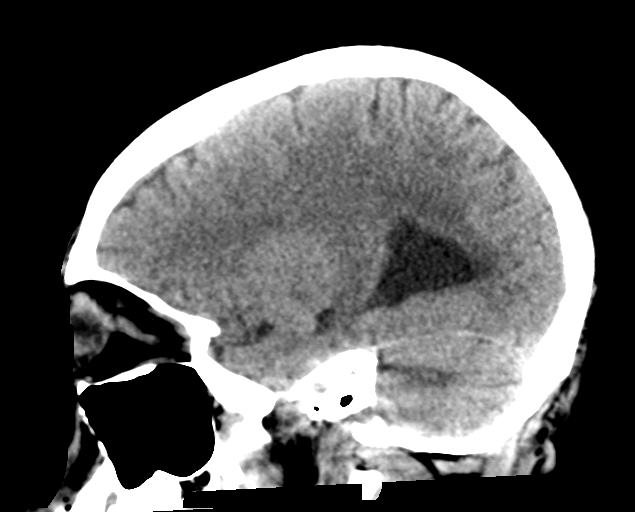

[16 of 47 positions shown; findings below may reference images not displayed]

FINDINGS: Brain: There is mild cerebral atrophy with widening of the
extra-axial spaces and ventricular dilatation.
There are areas of decreased attenuation within the white matter
tracts of the supratentorial brain, consistent with microvascular
disease changes.

A small area of cortical encephalomalacia, with adjacent chronic
white matter low attenuation is seen along the parasagittal region
of the right occipital lobe.

An additional area of chronic white matter low attenuation is again
seen within the anterior aspect of the cerebellum on the right
(axial CT image 9, CT series number 3).

Vascular: No hyperdense vessel or unexpected calcification.

Skull: Normal. Negative for fracture or focal lesion.

Sinuses/Orbits: No acute finding.

Other: None.
IMPRESSION: 1. Generalized cerebral atrophy.
2. Small, chronic right occipital lobe infarct.
3. No acute intracranial abnormality.

## 2020-02-01 MED ORDER — HALOPERIDOL LACTATE 5 MG/ML IJ SOLN
5.0000 mg | Freq: Once | INTRAMUSCULAR | Status: AC
  Administered 2020-02-01: 2.5 mg via INTRAMUSCULAR
  Filled 2020-02-01: qty 1

## 2020-02-01 MED ORDER — AMMONIA AROMATIC IN INHA
RESPIRATORY_TRACT | Status: AC
  Filled 2020-02-01: qty 10

## 2020-02-01 NOTE — ED Triage Notes (Signed)
Pt BIB [facility] for eval of unresponsiveness. Pt had witnessed collapse from across the st, bystander called 911. EMS reports ?seizure activity. GCS5-7 en route. C-collar applied d/t fall. Arousable to painful stimuli for MD McNeil on arrival, states she wants to go home, then becomes unresponsive again.

## 2020-02-01 NOTE — Progress Notes (Signed)
EEG complete - results pending 

## 2020-02-01 NOTE — ED Provider Notes (Signed)
I assumed care in signout.  Patient had extensive work-up for altered mental status.  Strong suspicion this is substance related.  Patient is now sleeping comfortably.  Once awake she can be discharged   Zadie Rhine, MD 02/01/20 2351

## 2020-02-01 NOTE — Procedures (Signed)
History: 44 year old female being evaluated for unresponsiveness  Sedation: None  Technique: This is a 21 channel routine scalp EEG performed at the bedside with bipolar and monopolar montages arranged in accordance to the international 10/20 system of electrode placement. One channel was dedicated to EKG recording.    Background: The background consists of intermixed alpha and beta activities. There is a well defined posterior dominant rhythm of 9-10 hz that attenuates with eye opening. Sleep is not recorded.  During the recording there is an episode of generalized thrashing, as well as holding her arms out in front of her and then shaking side-to-side.  The EEG throughout this event was normal.  Photic stimulation: Physiologic driving is not performed  EEG Abnormalities: None  Clinical Interpretation: This normal EEG is recorded in the waking state. There was no seizure or seizure predisposition recorded on this study.   There was an event captured, which though unclear if it was the same as the event seen by witnesses, had no EEG change and is most consistent with a nonepileptic event.  Ritta Slot, MD [facility] [phone]  If 7pm- 7am, please page neurology on call as listed in AMION.

## 2020-02-01 NOTE — ED Provider Notes (Signed)
[facility] EMERGENCY DEPARTMENT Provider Note   CSN: [id number] Arrival date & time:        History Chief Complaint  Patient presents with  . Unresponsive    Kaitlyn Gray is a 44 y.o. female.  The history is provided by the EMS personnel and medical records. No language interpreter was used.   Kaitlyn Gray is a 44 y.o. female who presents to the Emergency Department complaining of unresponsive.  Level V caveat due to unresponsiveness. History is provided by EMS. Per EMS report she was witness by bystanders that were across the street to collapse and have possible seizure like activity. On EMS arrival she was unresponsive with a GCS of 5 to 7. EMS reports that she recently left the hospital AMA following admission for stroke about three weeks ago. No drugs administered prior to ED arrival. No witnessed seizure activity by EMS.    No past medical history on file. history of CVA. There are no problems to display for this patient.      OB History   No obstetric history on file.     No family history on file.  Social History   Tobacco Use  . Smoking status: Not on file  Substance Use Topics  . Alcohol use: Not on file  . Drug use: Not on file    Home Medications Prior to Admission medications   Not on File    Allergies    Patient has no allergy information on record.  Review of Systems   Review of Systems  All other systems reviewed and are negative.   Physical Exam Updated Vital Signs BP (!) 159/101   Pulse 64   Resp (!) 0   SpO2 98%   Physical Exam Vitals and nursing note reviewed.  Constitutional:      Appearance: She is well-developed.  HENT:     Head: Normocephalic and atraumatic.  Cardiovascular:     Rate and Rhythm: Normal rate and regular rhythm.     Heart sounds: No murmur heard.   Pulmonary:     Effort: Pulmonary effort is normal. No respiratory distress.     Breath sounds: Normal breath sounds.   Abdominal:     Palpations: Abdomen is soft.     Tenderness: There is no abdominal tenderness. There is no guarding or rebound.  Musculoskeletal:        General: No tenderness.  Skin:    General: Skin is warm and dry.  Neurological:     Comments: Eyes open to painful stimuli. Blinks to threat. No spontaneous movement to all four extremities.  Psychiatric:     Comments: Unable to assess     ED Results / Procedures / Treatments   Labs (all labs ordered are listed, but only abnormal results are displayed) Labs Reviewed  COMPREHENSIVE METABOLIC PANEL - Abnormal; Notable for the following components:      Result Value   Potassium 3.4 (*)    Glucose, Bld 114 (*)    Creatinine, Ser 1.12 (*)    GFR calc non Af Amer 59 (*)    All other components within normal limits  CBC WITH DIFFERENTIAL/PLATELET - Abnormal; Notable for the following components:   Hemoglobin 11.2 (*)    MCH 25.5 (*)    MCHC 29.9 (*)    RDW 21.0 (*)    All other components within normal limits  CBG MONITORING, ED - Abnormal; Notable for the following components:   Glucose-Capillary 105 (*)  All other components within normal limits  CULTURE, BLOOD (ROUTINE X 2)  ETHANOL  URINALYSIS, ROUTINE W REFLEX MICROSCOPIC  AMMONIA  RAPID URINE DRUG SCREEN, HOSP PERFORMED    EKG EKG Interpretation  Date/Time:  Wednesday February 01 2020 17:17:12 EDT Ventricular Rate:  85 PR Interval:    QRS Duration: 109 QT Interval:  405 QTC Calculation: 482 R Axis:   35 Text Interpretation: Sinus rhythm Probable left atrial enlargement Left ventricular hypertrophy Confirmed by Tilden Fossa [phone]) on 02/01/2020 5:19:55 PM   Radiology CT Head Wo Contrast  Result Date: 02/01/2020 CLINICAL DATA:  Syncope with fall. EXAM: CT HEAD WITHOUT CONTRAST TECHNIQUE: Contiguous axial images were obtained from the base of the skull through the vertex without intravenous contrast. COMPARISON:  January 20, 2020 FINDINGS: Brain: There is mild  cerebral atrophy with widening of the extra-axial spaces and ventricular dilatation. There are areas of decreased attenuation within the white matter tracts of the supratentorial brain, consistent with microvascular disease changes. A small area of cortical encephalomalacia, with adjacent chronic white matter low attenuation is seen along the parasagittal region of the right occipital lobe. An additional area of chronic white matter low attenuation is again seen within the anterior aspect of the cerebellum on the right (axial CT image 9, CT series number 3). Vascular: No hyperdense vessel or unexpected calcification. Skull: Normal. Negative for fracture or focal lesion. Sinuses/Orbits: No acute finding. Other: None. IMPRESSION: 1. Generalized cerebral atrophy. 2. Small, chronic right occipital lobe infarct. 3. No acute intracranial abnormality. Electronically Signed   By: Aram Candela M.D.   On: 02/01/2020 20:32   CT Cervical Spine Wo Contrast  Result Date: 02/01/2020 CLINICAL DATA:  Syncope, status post fall. EXAM: CT CERVICAL SPINE WITHOUT CONTRAST TECHNIQUE: Multidetector CT imaging of the cervical spine was performed without intravenous contrast. Multiplanar CT image reconstructions were also generated. COMPARISON:  December 24, 2017 FINDINGS: Alignment: Normal. Skull base and vertebrae: No acute fracture. No primary bone lesion or focal pathologic process. Soft tissues and spinal canal: No prevertebral fluid or swelling. No visible canal hematoma. Disc levels: Moderate to marked severity anterior osteophyte formation is seen at the levels of C4-C5, C5-C6 and C6-C7. Mild anterior osteophyte formation is seen at the levels of C2-C3 and C3-C4. Very mild multilevel intervertebral disc space narrowing is seen. Normal multilevel bilateral facet joints are noted. Upper chest: Negative. Other: None. IMPRESSION: 1. No acute fracture within the cervical spine. 2. Moderate to marked severity degenerative changes at  the levels of C4-C5, C5-C6 and C6-C7. 3. Very mild multilevel intervertebral disc space narrowing is seen at the levels of C2-C3 and C3-C4. Electronically Signed   By: Aram Candela M.D.   On: 02/01/2020 20:36   EEG adult  Result Date: 02/01/2020 Rejeana Brock, MD     02/01/2020  8:31 PM History: 44 year old female being evaluated for unresponsiveness Sedation: None Technique: This is a 21 channel routine scalp EEG performed at the bedside with bipolar and monopolar montages arranged in accordance to the international 10/20 system of electrode placement. One channel was dedicated to EKG recording. Background: The background consists of intermixed alpha and beta activities. There is a well defined posterior dominant rhythm of 9-10 hz that attenuates with eye opening. Sleep is not recorded. During the recording there is an episode of generalized thrashing, as well as holding her arms out in front of her and then shaking side-to-side.  The EEG throughout this event was normal. Photic stimulation: Physiologic driving is not  performed EEG Abnormalities: None Clinical Interpretation: This normal EEG is recorded in the waking state. There was no seizure or seizure predisposition recorded on this study. There was an event captured, which though unclear if it was the same as the event seen by witnesses, had no EEG change and is most consistent with a nonepileptic event. Ritta Slot, MD [facility] [phone] If 7pm- 7am, please page neurology on call as listed in AMION.    Procedures Procedures (including critical care time)  Medications Ordered in ED Medications  ammonia inhalant (has no administration in time range)  haloperidol lactate (HALDOL) injection 5 mg (2.5 mg Intramuscular Given 02/01/20 1852)    ED Course  I have reviewed the triage vital signs and the nursing notes.  Pertinent labs & imaging results that were available during my care of the patient were  reviewed by me and considered in my medical decision making (see chart for details).    MDM Rules/Calculators/A&P                         Patient brought in for her unresponsiveness following syncopal episode with possible seizure like activity. Patient unresponsive on ED arrival but blinks to thread. She was evaluated by neurology and appeared to have some functional component to her neurologic symptoms. Patient would withdrawal to ammonia inhalant and withdrawal to painful stimuli. Stat EEG was performed by neurology that was not consistent with seizure activity. Patient did become more awake during her ED stay but was agitated and refusing a full workup but not study to leave the department. She was given a dose of Haldol for her agitation. CT scan is negative for acute abnormality but does demonstrate a remote right cerebellar infarct. On repeat assessment patient is persistently drowsy, possibly secondary to recent medication administration. Patient care transferred pending metabolize nation of medications and reassessment. Anticipate discharge when patient is more awake.  Presentation is not c/w acute CVA.    Final Clinical Impression(s) / ED Diagnoses Final diagnoses:  None    Rx / DC Orders ED Discharge Orders    None       Tilden Fossa, MD 02/01/20 2343

## 2020-02-01 NOTE — ED Notes (Addendum)
Pt and visitor sleeping in room.  

## 2020-02-01 NOTE — ED Notes (Signed)
RN & HCT removed soiled linens from bed and put clean linens on bed

## 2020-02-01 NOTE — Consult Note (Signed)
Neurology Consultation Reason for Consult: Unresponsiveness Referring Physician: Peterson Lombard  CC: Unresponsiveness  History is obtained from: Referring physician  HPI: Kaitlyn Gray is a 44 y.o. female with unknown past medical history due to presenting as a Kaitlyn Gray who was seen to be unresponsive after being seen to collapse with questionable seizure activity.  She has continued to be altered, she arrived with eyes closed, but open eyes to noxious stimulation.  Due to concern for nonconvulsive status epilepticus, neurology was consulted emergently.   ROS: Unable to obtain due to altered mental status.   Past medical history: Unable to obtain due to altered mental status.   Family history: Unable to obtain due to altered mental status.   Social History: Unable to obtain due to altered mental status.  Exam: Current vital signs: SpO2 97%  Vital signs in last 24 hours: SpO2:  [97 %] 97 % (08/18 1712)   Physical Exam  Constitutional: Appears well-developed and well-nourished.  Psych: Does not answer questions Eyes: No scleral injection HENT: No OP obstruction MSK: no joint deformities.  Cardiovascular: Normal rate and regular rhythm.  Respiratory: Effort normal, non-labored breathing GI: Soft.  No distension. There is no tenderness.  Skin: WDI  Neuro: Mental Status: Initially, she was unresponsive, however she would avoid her face with her arms.  Patient responds to noxious stimulation. After repeated nox stim, she begins repeatedly saying I want to go home.  Cranial Nerves: II: Blinks to threat bilaterally, has saccades with OKNs Pupils are equal, round, and reactive to light.   III,IV, VI: EOMI to dolls eye V: Facial sensation is symmetric to temperature VII: Facial movement is symmetric.  Motor: Moves all extremities well after noxious stimulation.  Sensory: Responds to nox stim x 4.  Cerebellar: Does not perform.   I have reviewed labs in epic and the results  pertinent to this consultation are:   I have reviewed the images obtained: CT head - pending.   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 Given the report of seizure activity, will get STAT EEG to rule out status epilepticus.   Recommendations: 1) STAT EEG 2) ammonia, BMP, CBC, UDS 3) head CT 4) If above are negative, would favor treating as psychogenic presentation.   This patient is critically ill and at significant risk of neurological worsening, death and care requires constant monitoring of vital signs, hemodynamics,respiratory and cardiac monitoring, neurological assessment, discussion with family, other specialists and medical decision making of high complexity. I spent 35 minutes of neurocritical care time  in the care of  this patient. This was time spent independent of any time provided by nurse practitioner or PA.  Ritta Slot, MD [facility] [phone]  If 7pm- 7am, please page neurology on call as listed in AMION. 02/01/2020  5:31 PM

## 2020-02-01 NOTE — ED Notes (Signed)
Attempted to ambulate pt w 2nd RN, pt unable to stand without assistance, RN's holding pt up, MD made aware

## 2020-02-02 NOTE — Evaluation (Signed)
Physical Therapy Evaluation Patient Details Name: Kaitlyn Gray MRN: [id number] DOB: 10-Nov-1975 Today's Date: 02/02/2020   History of Present Illness  Pt is a 44 y/o female presenting to the ED after being unresponsive. PMH includes CVA with R sided weakness.   Clinical Impression  Pt admitted secondary to problem above with deficits below. Pt requiring min A to sit at edge of stretcher this session. Pt with 1/5 strength in RLE, so unsafe to attempt further mobility with +1 assist from higher stretcher height. Pt reports multiple falls at home while ambulating, performing steps, and while in the tub. Feel she would benefit from SNF level therapies at d/c. Will continue to follow acutely to maximize functional mobility independence and safety.     Follow Up Recommendations SNF;Supervision/Assistance - 24 hour    Equipment Recommendations  Rolling walker with 5" wheels;Wheelchair cushion (measurements PT);Wheelchair (measurements PT)    Recommendations for Other Services       Precautions / Restrictions Precautions Precautions: Fall Restrictions Weight Bearing Restrictions: No      Mobility  Bed Mobility Overal bed mobility: Needs Assistance Bed Mobility: Supine to Sit;Sit to Supine     Supine to sit: Min assist Sit to supine: Min assist   General bed mobility comments: Min A for trunk and LE assist to perform bed mobility.   Transfers                 General transfer comment: Deferred as pt with RLE weakness and unsafe to stand with +1 from higher stretcher height.   Ambulation/Gait                Stairs            Wheelchair Mobility    Modified Rankin (Stroke Patients Only)       Balance Overall balance assessment: Needs assistance Sitting-balance support: No upper extremity supported;Feet supported Sitting balance-Leahy Scale: Fair                                       Pertinent Vitals/Pain Pain Assessment: No/denies  pain    Home Living Family/patient expects to be discharged to:: Private residence Living Arrangements: Alone Available Help at Discharge: Available PRN/intermittently;Friend(s) Type of Home: Apartment Home Access: Stairs to enter Entrance Stairs-Rails: Left Entrance Stairs-Number of Steps: flight Home Layout: One level Home Equipment: None      Prior Function Level of Independence: Independent         Comments: Has had multiple falls at home     Hand Dominance        Extremity/Trunk Assessment   Upper Extremity Assessment Upper Extremity Assessment: RUE deficits/detail RUE Deficits / Details: RUE weakness at baseline     Lower Extremity Assessment Lower Extremity Assessment: RLE deficits/detail RLE Deficits / Details: Grossly 1/5 throughout.  RLE Sensation: decreased light touch    Cervical / Trunk Assessment Cervical / Trunk Assessment: Normal  Communication   Communication: Expressive difficulties  Cognition Arousal/Alertness: Awake/alert Behavior During Therapy: WFL for tasks assessed/performed Overall Cognitive Status: History of cognitive impairments - at baseline                                        General Comments General comments (skin integrity, edema, etc.): Pts boyfriend present throughout    Exercises  Assessment/Plan    PT Assessment Patient needs continued PT services  PT Problem List Decreased strength;Decreased balance;Decreased mobility;Decreased cognition;Decreased coordination;Decreased knowledge of use of DME;Decreased safety awareness;Decreased knowledge of precautions       PT Treatment Interventions Gait training;DME instruction;Functional mobility training;Therapeutic exercise;Therapeutic activities;Balance training;Cognitive remediation;Neuromuscular re-education;Patient/family education    PT Goals (Current goals can be found in the Care Plan section)  Acute Rehab PT Goals Patient Stated Goal: to go  home PT Goal Formulation: With patient Time For Goal Achievement: 02/16/20 Potential to Achieve Goals: Fair    Frequency Min 2X/week   Barriers to discharge Inaccessible home environment;Decreased caregiver support      Co-evaluation               AM-PAC PT "6 Clicks" Mobility  Outcome Measure Help needed turning from your back to your side while in a flat bed without using bedrails?: A Little Help needed moving from lying on your back to sitting on the side of a flat bed without using bedrails?: A Little Help needed moving to and from a bed to a chair (including a wheelchair)?: A Lot Help needed standing up from a chair using your arms (e.g., wheelchair or bedside chair)?: A Lot Help needed to walk in hospital room?: Total Help needed climbing 3-5 steps with a railing? : Total 6 Click Score: 12    End of Session   Activity Tolerance: Patient tolerated treatment well Patient left: in bed;with call bell/phone within reach;with family/visitor present (on stretcher in ED) Nurse Communication: Mobility status PT Visit Diagnosis: Other abnormalities of gait and mobility (R26.89);History of falling (Z91.81);Repeated falls (R29.6);Muscle weakness (generalized) (M62.81)    Time: 8299-3716 PT Time Calculation (min) (ACUTE ONLY): 10 min   Charges:   PT Evaluation $PT Eval Moderate Complexity: 1 Mod          Farley Ly, PT, DPT  Acute Rehabilitation Services  Pager: [phone] Office: [phone]   Lehman Prom 02/02/2020, 2:11 PM

## 2020-02-02 NOTE — Discharge Planning (Signed)
Oletta Cohn, RN, BSN, [place] [phone] Pt qualifies for DME rolling walker.  DME  ordered through [facility].  PT evaluation supports SNF placement but due to pt recent cocaine use, pt will not be accepted in SNF at this time.

## 2020-02-02 NOTE — ED Notes (Signed)
Downtime documentation: Pt moved to hw d/t critical pt coming in. Pt sleeping in bed, friend at bedside sleeping. Attempted to wake pt & friend up several times, both would not wake up and in deep slumber. 3 hours later, spoke with MD and charge- was told to provide pt with taxi voucher and dc home as long as pt could ambulate independently and was A&Ox4. Pt needed assistance walking and stated she lives on the second floor and unable to get up stairs to apt. Alone and has had several falls while living alone. Pt friend unable to help her because he is "banned from the projects". Spoke with MD and charge,  Spoke with pt and she agreed to wait and speak with social work in the morning. Provided pt with snack and drink, blanket and pt and friend are resting in bed. Pt in NAD.

## 2020-02-02 NOTE — Discharge Planning (Signed)
Pt requested "happy meal."   RNCM verified with bedside staff that pt can have food before giving her requested bagged meal.

## 2020-02-02 NOTE — ED Provider Notes (Signed)
Patient awake alert, no acute distress. She is taking p.o.   Kaitlyn Rhine, MD 02/02/20 [phone]

## 2020-02-02 NOTE — Discharge Planning (Signed)
Transitions of Care consulted regarding evaluation for skilled nursing facility placement.  RNCM met with pt at bedside who states she is unable to care for herself at home as she lives alone.  Pt states she has had a stroke in the past and still has trouble moving around and keeping her balance.  RNCM consulted PT for an evaluation and will utilize their recommendations for discharge planning.

## 2020-02-02 NOTE — ED Notes (Signed)
Discharge teaching reviewed with patient and family. Patient and family verbalized understanding.

## 2020-02-02 NOTE — ED Provider Notes (Signed)
Patient now awake and alert.  She reports she is unable to go home she has had a stroke previously and cannot care for herself at home.  She has difficulty walking at baseline.  No new complaints reported.  Will have patient see social work and case management in the morning   Zadie Rhine, MD 02/02/20 [phone]

## 2020-02-06 LAB — CULTURE, BLOOD (ROUTINE X 2): Culture: NO GROWTH

## 2020-02-08 ENCOUNTER — Inpatient Hospital Stay (HOSPITAL_COMMUNITY)
Admission: EM | Admit: 2020-02-08 | Discharge: 2020-02-14 | DRG: 065 | Discharge status: Against Medical Advice | Payer: Medicaid Other | Attending: Internal Medicine | Admitting: Internal Medicine

## 2020-02-08 DIAGNOSIS — R2681 Unsteadiness on feet: Secondary | ICD-10-CM | POA: Diagnosis present

## 2020-02-08 DIAGNOSIS — Z7902 Long term (current) use of antithrombotics/antiplatelets: Secondary | ICD-10-CM

## 2020-02-08 DIAGNOSIS — R471 Dysarthria and anarthria: Secondary | ICD-10-CM | POA: Diagnosis present

## 2020-02-08 DIAGNOSIS — F17210 Nicotine dependence, cigarettes, uncomplicated: Secondary | ICD-10-CM | POA: Diagnosis present

## 2020-02-08 DIAGNOSIS — F4324 Adjustment disorder with disturbance of conduct: Secondary | ICD-10-CM

## 2020-02-08 DIAGNOSIS — E785 Hyperlipidemia, unspecified: Secondary | ICD-10-CM | POA: Diagnosis present

## 2020-02-08 DIAGNOSIS — I63531 Cerebral infarction due to unspecified occlusion or stenosis of right posterior cerebral artery: Principal | ICD-10-CM | POA: Diagnosis present

## 2020-02-08 DIAGNOSIS — Z91040 Latex allergy status: Secondary | ICD-10-CM

## 2020-02-08 DIAGNOSIS — Z8249 Family history of ischemic heart disease and other diseases of the circulatory system: Secondary | ICD-10-CM

## 2020-02-08 DIAGNOSIS — I69351 Hemiplegia and hemiparesis following cerebral infarction affecting right dominant side: Secondary | ICD-10-CM

## 2020-02-08 DIAGNOSIS — Z794 Long term (current) use of insulin: Secondary | ICD-10-CM

## 2020-02-08 DIAGNOSIS — R200 Anesthesia of skin: Secondary | ICD-10-CM

## 2020-02-08 DIAGNOSIS — Z885 Allergy status to narcotic agent status: Secondary | ICD-10-CM

## 2020-02-08 DIAGNOSIS — I6381 Other cerebral infarction due to occlusion or stenosis of small artery: Secondary | ICD-10-CM | POA: Diagnosis present

## 2020-02-08 DIAGNOSIS — D649 Anemia, unspecified: Secondary | ICD-10-CM | POA: Diagnosis present

## 2020-02-08 DIAGNOSIS — F1420 Cocaine dependence, uncomplicated: Secondary | ICD-10-CM | POA: Diagnosis present

## 2020-02-08 DIAGNOSIS — F1220 Cannabis dependence, uncomplicated: Secondary | ICD-10-CM | POA: Diagnosis present

## 2020-02-08 DIAGNOSIS — Z79899 Other long term (current) drug therapy: Secondary | ICD-10-CM

## 2020-02-08 DIAGNOSIS — Z9114 Patient's other noncompliance with medication regimen: Secondary | ICD-10-CM

## 2020-02-08 DIAGNOSIS — R531 Weakness: Secondary | ICD-10-CM | POA: Diagnosis present

## 2020-02-08 DIAGNOSIS — R079 Chest pain, unspecified: Secondary | ICD-10-CM

## 2020-02-08 DIAGNOSIS — I152 Hypertension secondary to endocrine disorders: Secondary | ICD-10-CM | POA: Diagnosis present

## 2020-02-08 DIAGNOSIS — E669 Obesity, unspecified: Secondary | ICD-10-CM | POA: Diagnosis present

## 2020-02-08 DIAGNOSIS — I10 Essential (primary) hypertension: Secondary | ICD-10-CM | POA: Diagnosis present

## 2020-02-08 DIAGNOSIS — I119 Hypertensive heart disease without heart failure: Secondary | ICD-10-CM | POA: Diagnosis present

## 2020-02-08 DIAGNOSIS — F3130 Bipolar disorder, current episode depressed, mild or moderate severity, unspecified: Secondary | ICD-10-CM | POA: Diagnosis present

## 2020-02-08 DIAGNOSIS — I6350 Cerebral infarction due to unspecified occlusion or stenosis of unspecified cerebral artery: Secondary | ICD-10-CM

## 2020-02-08 DIAGNOSIS — Z6832 Body mass index (BMI) 32.0-32.9, adult: Secondary | ICD-10-CM

## 2020-02-08 DIAGNOSIS — Z7982 Long term (current) use of aspirin: Secondary | ICD-10-CM

## 2020-02-08 DIAGNOSIS — E119 Type 2 diabetes mellitus without complications: Secondary | ICD-10-CM

## 2020-02-08 DIAGNOSIS — Z7984 Long term (current) use of oral hypoglycemic drugs: Secondary | ICD-10-CM

## 2020-02-08 DIAGNOSIS — G8194 Hemiplegia, unspecified affecting left nondominant side: Secondary | ICD-10-CM | POA: Diagnosis present

## 2020-02-08 DIAGNOSIS — R400 Somnolence: Secondary | ICD-10-CM | POA: Diagnosis present

## 2020-02-08 DIAGNOSIS — Z20822 Contact with and (suspected) exposure to covid-19: Secondary | ICD-10-CM | POA: Diagnosis present

## 2020-02-08 DIAGNOSIS — I639 Cerebral infarction, unspecified: Secondary | ICD-10-CM | POA: Diagnosis present

## 2020-02-08 DIAGNOSIS — Z5329 Procedure and treatment not carried out because of patient's decision for other reasons: Secondary | ICD-10-CM | POA: Diagnosis present

## 2020-02-08 DIAGNOSIS — H538 Other visual disturbances: Secondary | ICD-10-CM | POA: Diagnosis present

## 2020-02-09 ENCOUNTER — Emergency Department (HOSPITAL_COMMUNITY): Payer: Medicaid Other

## 2020-02-09 ENCOUNTER — Other Ambulatory Visit: Payer: Self-pay

## 2020-02-09 ENCOUNTER — Encounter (HOSPITAL_COMMUNITY): Payer: Self-pay | Admitting: Emergency Medicine

## 2020-02-09 DIAGNOSIS — Z8249 Family history of ischemic heart disease and other diseases of the circulatory system: Secondary | ICD-10-CM | POA: Diagnosis not present

## 2020-02-09 DIAGNOSIS — F1420 Cocaine dependence, uncomplicated: Secondary | ICD-10-CM | POA: Diagnosis present

## 2020-02-09 DIAGNOSIS — Z79899 Other long term (current) drug therapy: Secondary | ICD-10-CM | POA: Diagnosis not present

## 2020-02-09 DIAGNOSIS — I639 Cerebral infarction, unspecified: Secondary | ICD-10-CM

## 2020-02-09 DIAGNOSIS — Z91040 Latex allergy status: Secondary | ICD-10-CM | POA: Diagnosis not present

## 2020-02-09 DIAGNOSIS — Z20822 Contact with and (suspected) exposure to covid-19: Secondary | ICD-10-CM | POA: Diagnosis present

## 2020-02-09 DIAGNOSIS — R079 Chest pain, unspecified: Secondary | ICD-10-CM | POA: Diagnosis not present

## 2020-02-09 DIAGNOSIS — I63531 Cerebral infarction due to unspecified occlusion or stenosis of right posterior cerebral artery: Secondary | ICD-10-CM | POA: Diagnosis present

## 2020-02-09 DIAGNOSIS — I6381 Other cerebral infarction due to occlusion or stenosis of small artery: Secondary | ICD-10-CM | POA: Diagnosis present

## 2020-02-09 DIAGNOSIS — Z8669 Personal history of other diseases of the nervous system and sense organs: Secondary | ICD-10-CM | POA: Diagnosis not present

## 2020-02-09 DIAGNOSIS — F3130 Bipolar disorder, current episode depressed, mild or moderate severity, unspecified: Secondary | ICD-10-CM | POA: Diagnosis present

## 2020-02-09 DIAGNOSIS — E119 Type 2 diabetes mellitus without complications: Secondary | ICD-10-CM | POA: Diagnosis present

## 2020-02-09 DIAGNOSIS — Z794 Long term (current) use of insulin: Secondary | ICD-10-CM | POA: Diagnosis not present

## 2020-02-09 DIAGNOSIS — D649 Anemia, unspecified: Secondary | ICD-10-CM | POA: Diagnosis present

## 2020-02-09 DIAGNOSIS — F1220 Cannabis dependence, uncomplicated: Secondary | ICD-10-CM | POA: Diagnosis present

## 2020-02-09 DIAGNOSIS — E785 Hyperlipidemia, unspecified: Secondary | ICD-10-CM | POA: Diagnosis present

## 2020-02-09 DIAGNOSIS — Z9114 Patient's other noncompliance with medication regimen: Secondary | ICD-10-CM | POA: Diagnosis not present

## 2020-02-09 DIAGNOSIS — Z8673 Personal history of transient ischemic attack (TIA), and cerebral infarction without residual deficits: Secondary | ICD-10-CM | POA: Diagnosis not present

## 2020-02-09 DIAGNOSIS — Z885 Allergy status to narcotic agent status: Secondary | ICD-10-CM | POA: Diagnosis not present

## 2020-02-09 DIAGNOSIS — R471 Dysarthria and anarthria: Secondary | ICD-10-CM | POA: Diagnosis present

## 2020-02-09 DIAGNOSIS — I69351 Hemiplegia and hemiparesis following cerebral infarction affecting right dominant side: Secondary | ICD-10-CM | POA: Diagnosis not present

## 2020-02-09 DIAGNOSIS — F17210 Nicotine dependence, cigarettes, uncomplicated: Secondary | ICD-10-CM | POA: Diagnosis present

## 2020-02-09 DIAGNOSIS — I10 Essential (primary) hypertension: Secondary | ICD-10-CM | POA: Diagnosis not present

## 2020-02-09 DIAGNOSIS — F4324 Adjustment disorder with disturbance of conduct: Secondary | ICD-10-CM | POA: Diagnosis not present

## 2020-02-09 DIAGNOSIS — Z7984 Long term (current) use of oral hypoglycemic drugs: Secondary | ICD-10-CM | POA: Diagnosis not present

## 2020-02-09 DIAGNOSIS — R569 Unspecified convulsions: Secondary | ICD-10-CM | POA: Diagnosis not present

## 2020-02-09 DIAGNOSIS — H538 Other visual disturbances: Secondary | ICD-10-CM | POA: Diagnosis present

## 2020-02-09 DIAGNOSIS — I119 Hypertensive heart disease without heart failure: Secondary | ICD-10-CM | POA: Diagnosis present

## 2020-02-09 DIAGNOSIS — G8194 Hemiplegia, unspecified affecting left nondominant side: Secondary | ICD-10-CM | POA: Diagnosis present

## 2020-02-09 DIAGNOSIS — R531 Weakness: Secondary | ICD-10-CM | POA: Diagnosis present

## 2020-02-09 HISTORY — DX: Anemia, unspecified: D64.9

## 2020-02-09 LAB — COMPREHENSIVE METABOLIC PANEL
ALT: 13 U/L (ref 0–44)
AST: 15 U/L (ref 15–41)
Albumin: 3.5 g/dL (ref 3.5–5.0)
Alkaline Phosphatase: 65 U/L (ref 38–126)
Anion gap: 10 (ref 5–15)
BUN: 10 mg/dL (ref 6–20)
CO2: 23 mmol/L (ref 22–32)
Calcium: 9 mg/dL (ref 8.9–10.3)
Chloride: 106 mmol/L (ref 98–111)
Creatinine, Ser: 1.11 mg/dL — ABNORMAL HIGH (ref 0.44–1.00)
GFR calc Af Amer: >60 mL/min (ref >60)
GFR calc non Af Amer: 60 mL/min — ABNORMAL LOW (ref >60)
Glucose, Bld: 159 mg/dL — ABNORMAL HIGH (ref 70–99)
Potassium: 3.7 mmol/L (ref 3.5–5.1)
Sodium: 139 mmol/L (ref 135–145)
Total Bilirubin: 0.2 mg/dL — ABNORMAL LOW (ref 0.3–1.2)
Total Protein: 6.8 g/dL (ref 6.5–8.1)

## 2020-02-09 LAB — CBC
HCT: 34 % — ABNORMAL LOW (ref 36.0–46.0)
HCT: 35.2 % — ABNORMAL LOW (ref 36.0–46.0)
Hemoglobin: 10.2 g/dL — ABNORMAL LOW (ref 12.0–15.0)
Hemoglobin: 10.6 g/dL — ABNORMAL LOW (ref 12.0–15.0)
MCH: 25.7 pg — ABNORMAL LOW (ref 26.0–34.0)
MCH: 25.7 pg — ABNORMAL LOW (ref 26.0–34.0)
MCHC: 30 g/dL (ref 30.0–36.0)
MCHC: 30.1 g/dL (ref 30.0–36.0)
MCV: 85.2 fL (ref 80.0–100.0)
MCV: 85.6 fL (ref 80.0–100.0)
Platelets: 276 10*3/uL (ref 150–400)
Platelets: 302 10*3/uL (ref 150–400)
RBC: 3.97 MIL/uL (ref 3.87–5.11)
RBC: 4.13 MIL/uL (ref 3.87–5.11)
RDW: 20.1 % — ABNORMAL HIGH (ref 11.5–15.5)
RDW: 20.2 % — ABNORMAL HIGH (ref 11.5–15.5)
WBC: 4.8 10*3/uL (ref 4.0–10.5)
WBC: 4.9 10*3/uL (ref 4.0–10.5)
nRBC: 0 % (ref 0.0–0.2)
nRBC: 0 % (ref 0.0–0.2)

## 2020-02-09 LAB — DIFFERENTIAL
Abs Immature Granulocytes: 0.01 10*3/uL (ref 0.00–0.07)
Basophils Absolute: 0 10*3/uL (ref 0.0–0.1)
Basophils Relative: 1 %
Eosinophils Absolute: 0.2 10*3/uL (ref 0.0–0.5)
Eosinophils Relative: 4 %
Immature Granulocytes: 0 %
Lymphocytes Relative: 31 %
Lymphs Abs: 1.5 10*3/uL (ref 0.7–4.0)
Monocytes Absolute: 0.3 10*3/uL (ref 0.1–1.0)
Monocytes Relative: 5 %
Neutro Abs: 2.9 10*3/uL (ref 1.7–7.7)
Neutrophils Relative %: 59 %

## 2020-02-09 LAB — I-STAT CHEM 8, ED
BUN: 11 mg/dL (ref 6–20)
Calcium, Ion: 1.22 mmol/L (ref 1.15–1.40)
Chloride: 106 mmol/L (ref 98–111)
Creatinine, Ser: 1.1 mg/dL — ABNORMAL HIGH (ref 0.44–1.00)
Glucose, Bld: 154 mg/dL — ABNORMAL HIGH (ref 70–99)
HCT: 34 % — ABNORMAL LOW (ref 36.0–46.0)
Hemoglobin: 11.6 g/dL — ABNORMAL LOW (ref 12.0–15.0)
Potassium: 3.5 mmol/L (ref 3.5–5.1)
Sodium: 143 mmol/L (ref 135–145)
TCO2: 25 mmol/L (ref 22–32)

## 2020-02-09 LAB — RAPID URINE DRUG SCREEN, HOSP PERFORMED
Amphetamines: NOT DETECTED
Barbiturates: NOT DETECTED
Benzodiazepines: NOT DETECTED
Cocaine: POSITIVE — AB
Opiates: NOT DETECTED
Tetrahydrocannabinol: POSITIVE — AB

## 2020-02-09 LAB — PROTIME-INR
INR: 1 (ref 0.8–1.2)
Prothrombin Time: 12.6 seconds (ref 11.4–15.2)

## 2020-02-09 LAB — CBG MONITORING, ED
Glucose-Capillary: 142 mg/dL — ABNORMAL HIGH (ref 70–99)
Glucose-Capillary: 97 mg/dL (ref 70–99)

## 2020-02-09 LAB — I-STAT BETA HCG BLOOD, ED (MC, WL, AP ONLY): I-stat hCG, quantitative: <5 m[IU]/mL (ref <5)

## 2020-02-09 LAB — CREATININE, SERUM
Creatinine, Ser: 0.91 mg/dL (ref 0.44–1.00)
GFR calc Af Amer: >60 mL/min (ref >60)
GFR calc non Af Amer: >60 mL/min (ref >60)

## 2020-02-09 LAB — APTT: aPTT: 27 seconds (ref 24–36)

## 2020-02-09 LAB — GLUCOSE, CAPILLARY: Glucose-Capillary: 145 mg/dL — ABNORMAL HIGH (ref 70–99)

## 2020-02-09 LAB — SARS CORONAVIRUS 2 BY RT PCR (HOSPITAL ORDER, PERFORMED IN ~~LOC~~ HOSPITAL LAB): SARS Coronavirus 2: NEGATIVE

## 2020-02-09 LAB — ETHANOL: Alcohol, Ethyl (B): <10 mg/dL (ref <10)

## 2020-02-09 IMAGING — MR MR HEAD WO/W CM
7 of 13 series · 27 of 48 positions shown · IV contrast (Yes GAD)
Comparison: [DATE]

CLINICAL DATA: Left-sided numbness

EXAM:
MRI HEAD WITHOUT AND WITH CONTRAST
TECHNIQUE: Multiplanar, multiecho pulse sequences of the brain and surrounding
structures were obtained without and with intravenous contrast.
CONTRAST:  8mL GADAVIST GADOBUTROL 1 MMOL/ML IV SOLN

[Series 2: DWI · axial · 3.0mm · 0.94mm/px · z∈[-146,-7]mm · 6 of 100 slices shown (1 of 3)]
[im 1/100]
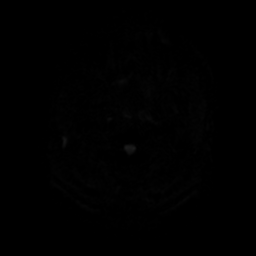
[im 20/100]
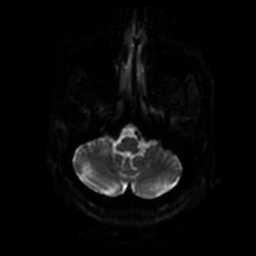
[im 40/100]
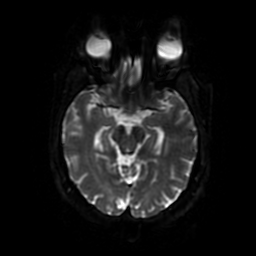
[im 60/100]
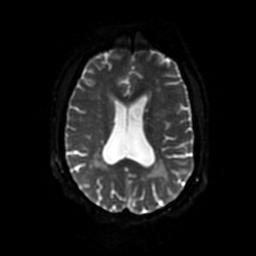
[im 80/100]
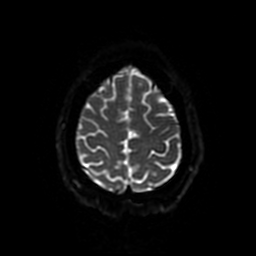
[im 100/100]
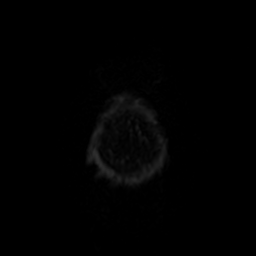

[Series 3: DWI · coronal · 4.0mm · 0.94mm/px · 4 of 74 slices shown (2 of 3)]
[im 1/74]
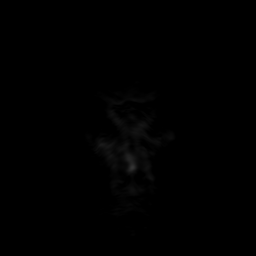
[im 25/74]
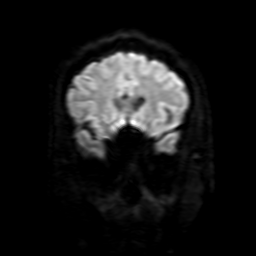
[im 49/74]
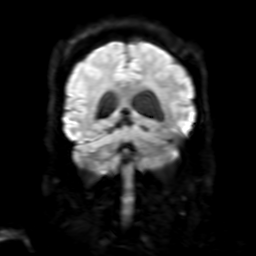
[im 74/74]
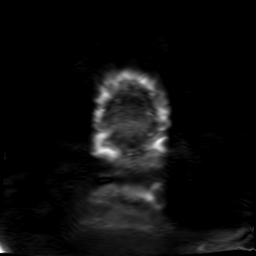

[Series 4: FLAIR · sagittal · 5.0mm · 0.23mm/px · 2 of 23 slices shown (1 of 2)]
[im 1/23]
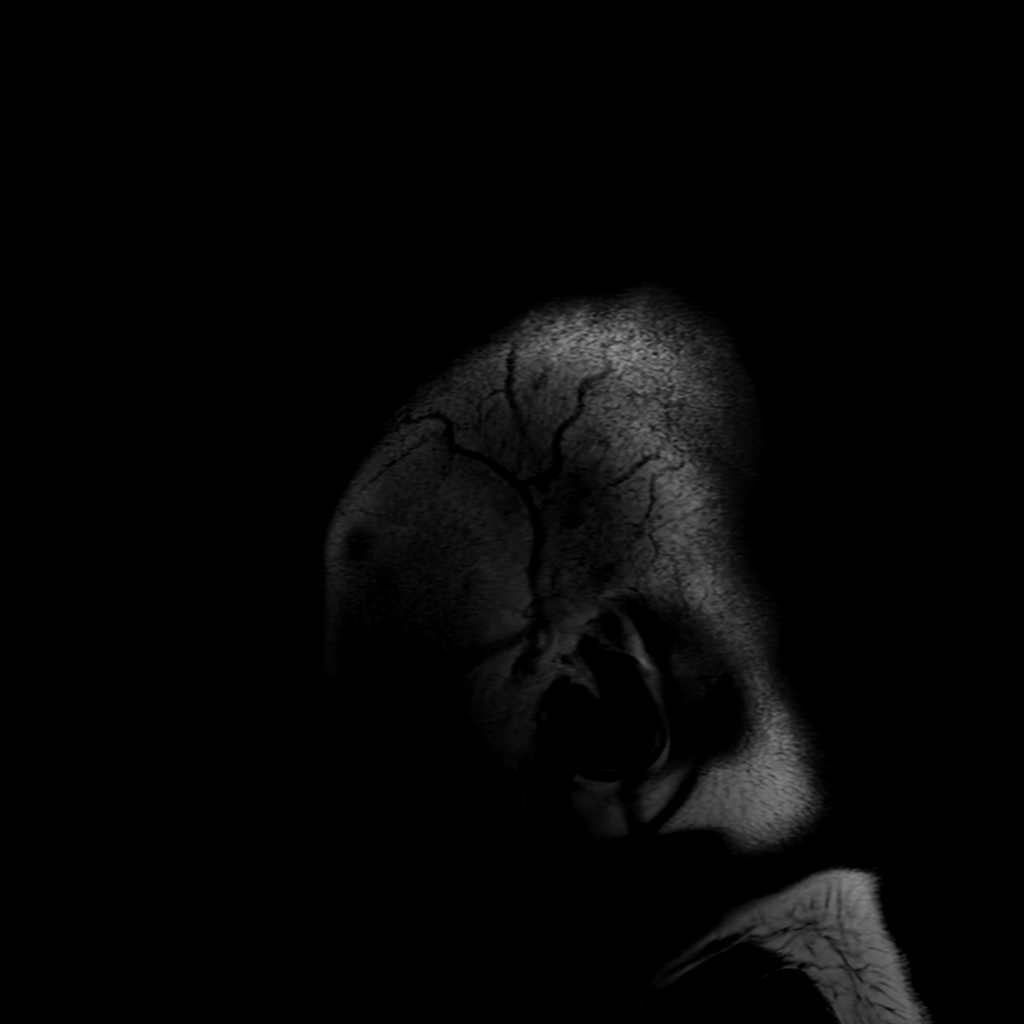
[im 23/23]
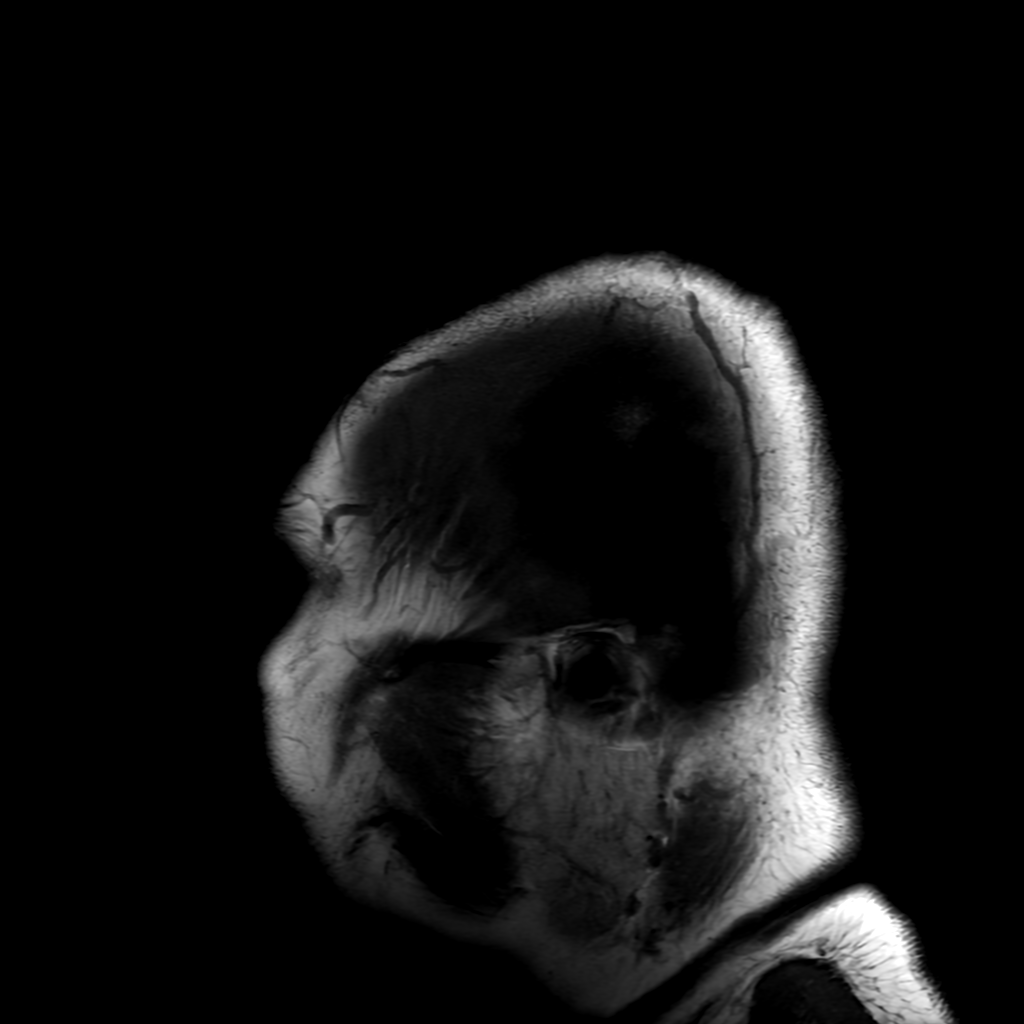

[Series 6: FLAIR · axial · 3.0mm · 0.47mm/px · z∈[-134,+14]mm · 2 of 26 slices shown (2 of 2)]
[im 1/26]
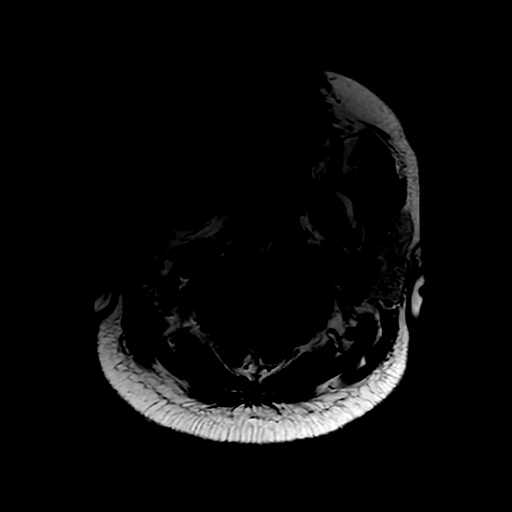
[im 26/26]
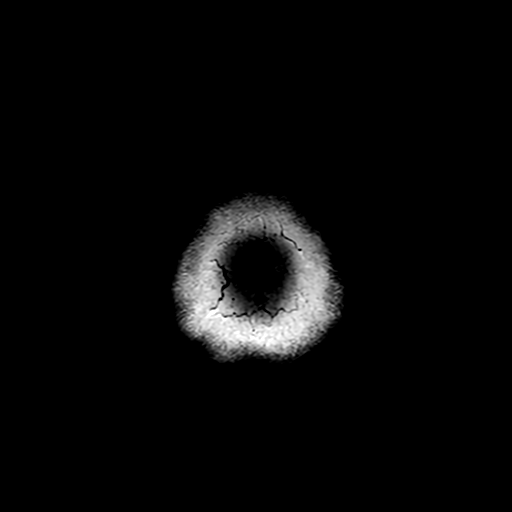

[Series 210: DWI · axial · 3.0mm · 0.94mm/px · z∈[-146,-7]mm · 7 of 100 slices shown (3 of 3)]
[im 1/100]
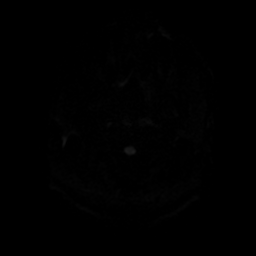
[im 17/100]
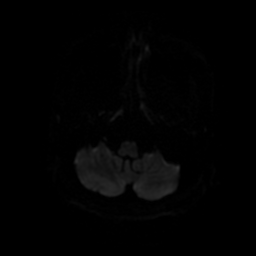
[im 34/100]
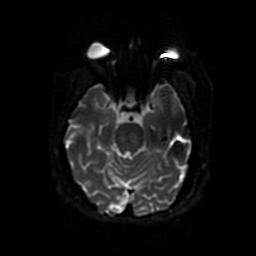
[im 50/100]
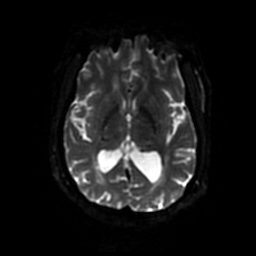
[im 67/100]
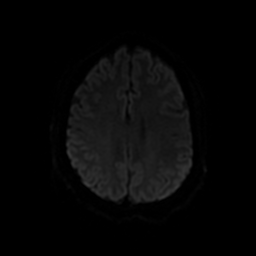
[im 83/100]
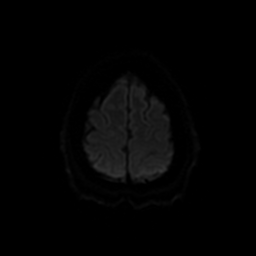
[im 100/100]
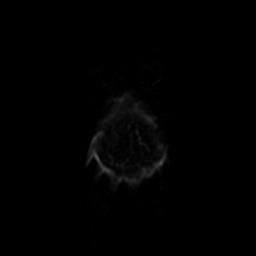

[Series 250: ADC · axial · 3.0mm · 0.94mm/px · z∈[-146,-7]mm · 3 of 50 slices shown (1 of 2)]
[im 1/50]
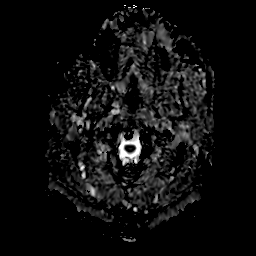
[im 25/50]
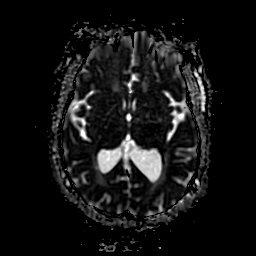
[im 50/50]
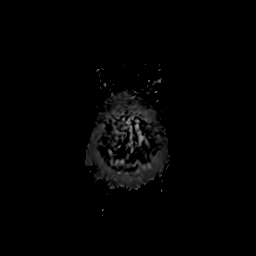

[Series 350: ADC · coronal · 4.0mm · 0.94mm/px · 3 of 37 slices shown (2 of 2)]
[im 1/37]
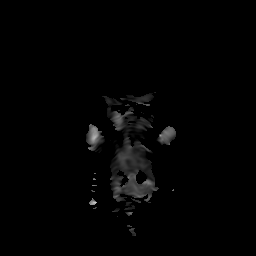
[im 19/37]
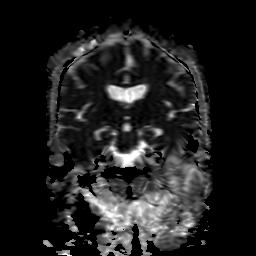
[im 37/37]
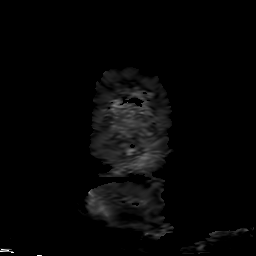

[27 of 48 positions shown; findings below may reference images not displayed]

FINDINGS: Brain: There is new restricted diffusion at the right aspect of the
pons. No associated enhancement.

There has been evolution of the abnormal diffusion and T2
hyperintensity and resolution of enhancement centered within the
right middle cerebellar peduncle.

Foci of susceptibility are again identified within the thalamus
bilaterally likely reflecting chronic microhemorrhages. A few other
punctate foci are present in the cerebral white matter and
cerebellum.

Patchy and confluent areas of T2 hyperintensity in the
supratentorial and pontine white matter are nonspecific but may
reflect moderate chronic microvascular ischemic changes. Ventricles
are stable in size. No intracranial mass or significant mass effect.

Vascular: Major vessel flow voids at the skull base are preserved.

Skull and upper cervical spine: Normal marrow signal is preserved.

Sinuses/Orbits: Mild mucosal thickening. Chronic deformity of the
right medial orbital wall.

Other: Sella is unremarkable.  Mastoid air cells are clear.
IMPRESSION: Acute right pontine infarct.

Evolution of previous abnormality centered within the right middle
cerebellar peduncle, which was likely also ischemic in etiology.

Stable additional findings detailed above.

## 2020-02-09 IMAGING — CT CT HEAD W/O CM
4 series · 16 of 47 positions shown, 18 images · non-contrast
Comparison: [DATE]

CLINICAL DATA: Left-sided numbness, ataxia, dysarthria

EXAM:
CT HEAD WITHOUT CONTRAST
TECHNIQUE: Contiguous axial images were obtained from the base of the skull
through the vertex without intravenous contrast.

[Series 3: head without · axial · non-contrast · 0.43mm/px · z∈[-174,-69]mm · 6 of 31 slices shown, 8 images]
[im 5/31  brain]
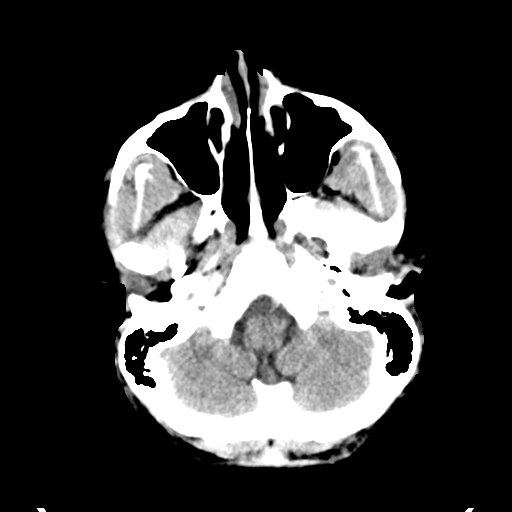
[im 5/31  bone]
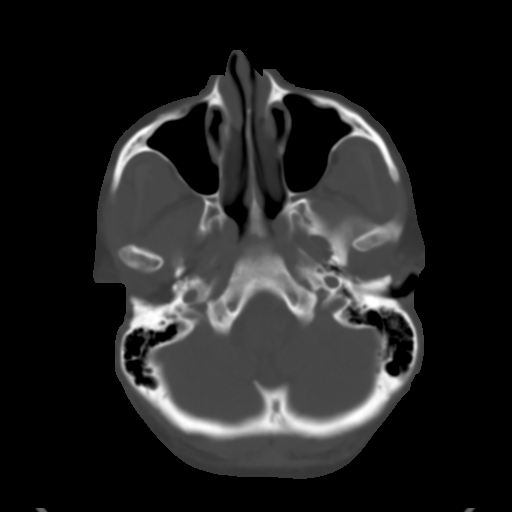
[im 9/31  brain]
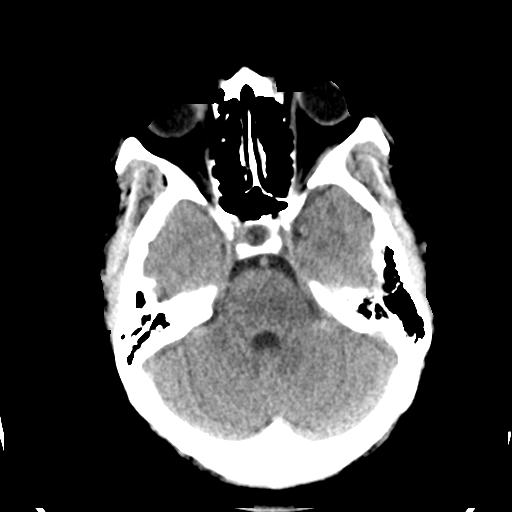
[im 13/31  brain]
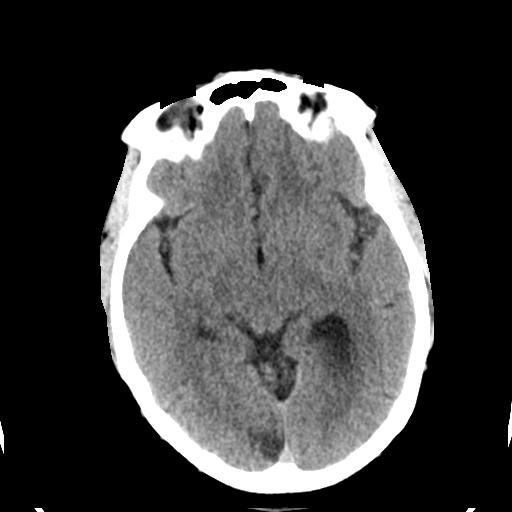
[im 18/31  brain]
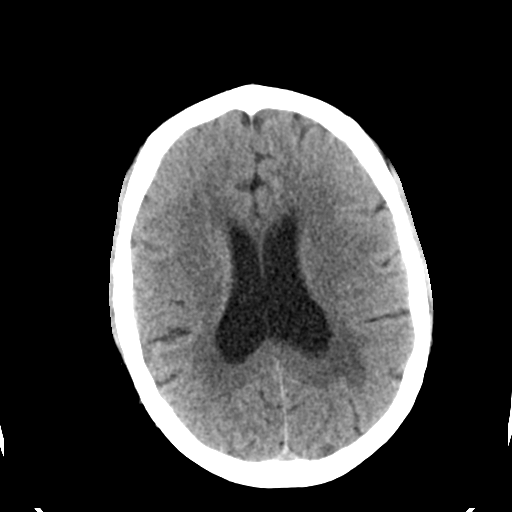
[im 22/31  brain]
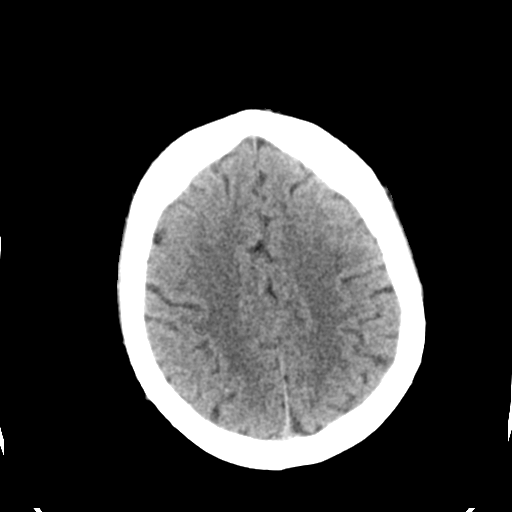
[im 22/31  bone]
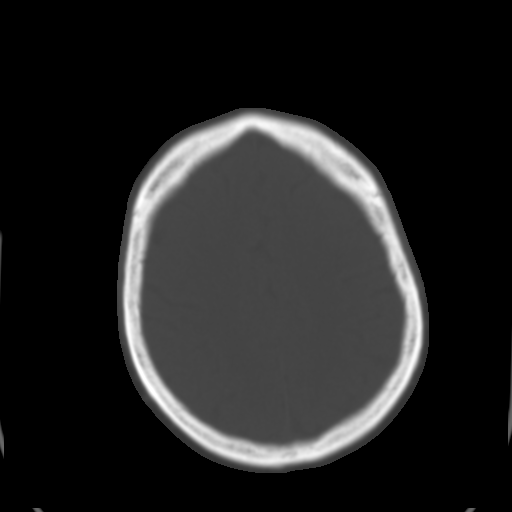
[im 26/31  brain]
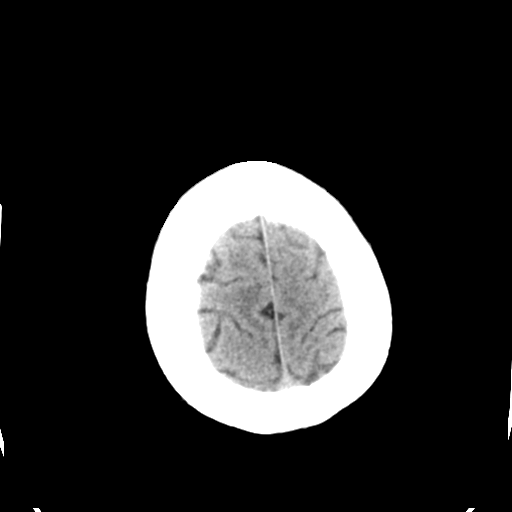

[Series 4: head bone · axial · 0.43mm/px · z∈[-180,-124]mm · 4 of 83 slices shown]
[im 8/83  bone]
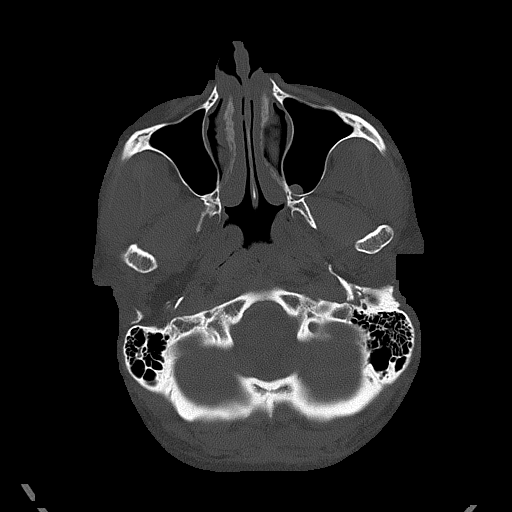
[im 16/83  bone]
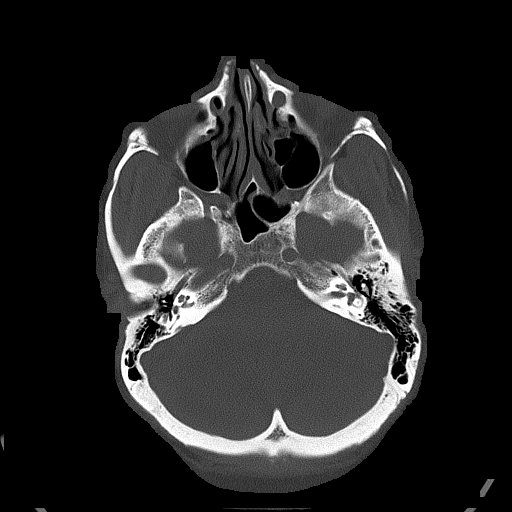
[im 28/83  bone]
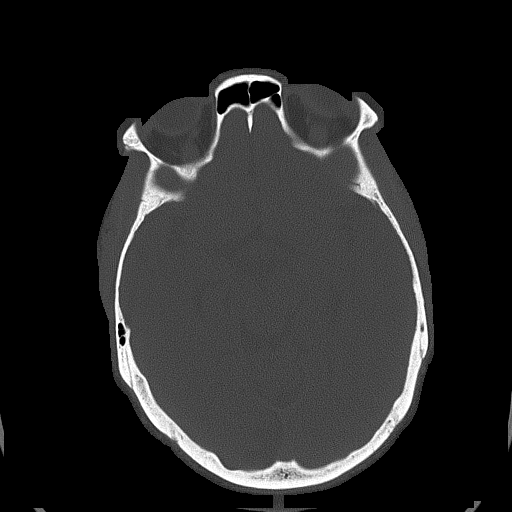
[im 36/83  bone]
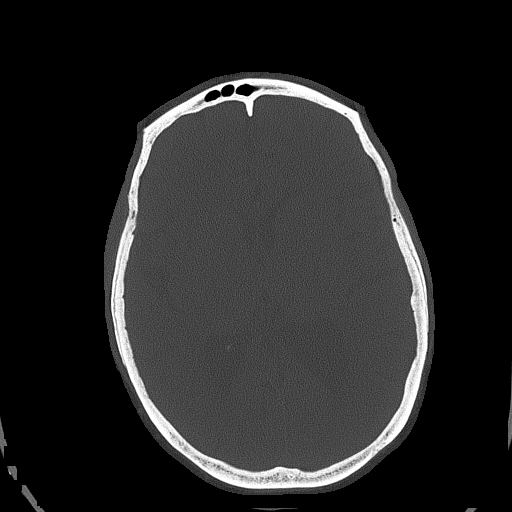

[Series 5: head without cor · coronal · non-contrast · 0.32mm/px · 3 of 67 slices shown]
[im 23/67  brain]
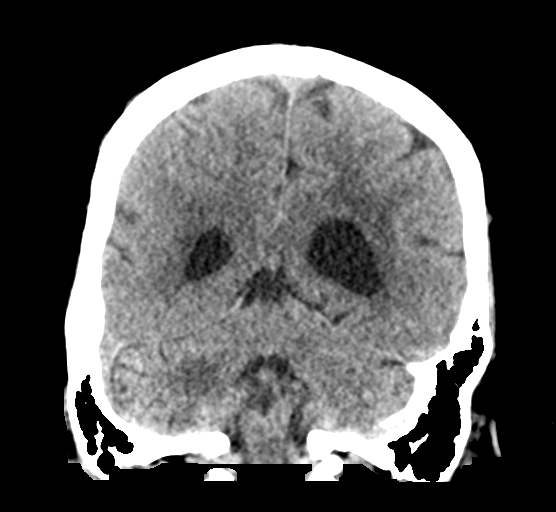
[im 30/67  brain]
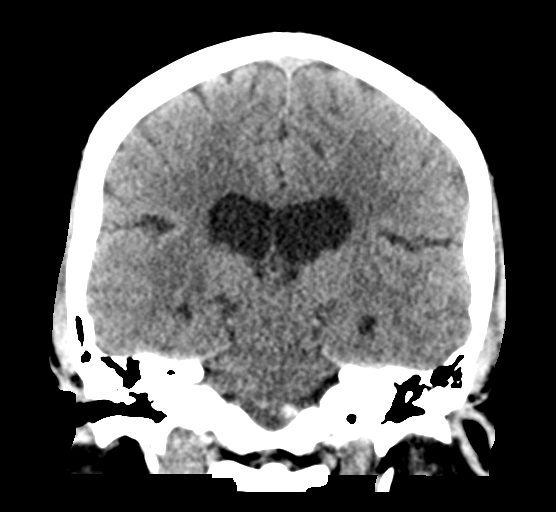
[im 37/67  brain]
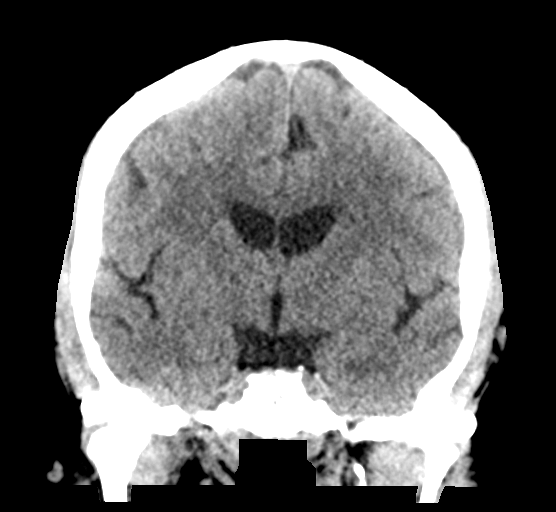

[Series 6: head without sag · sagittal · non-contrast · 0.32mm/px · 3 of 56 slices shown]
[im 19/56  brain]
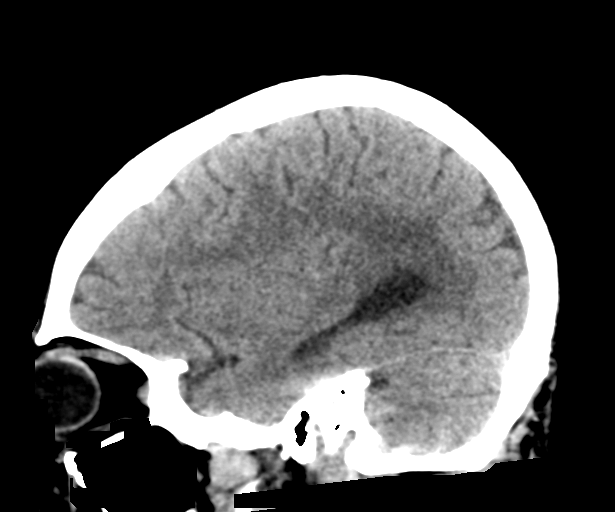
[im 28/56  brain]
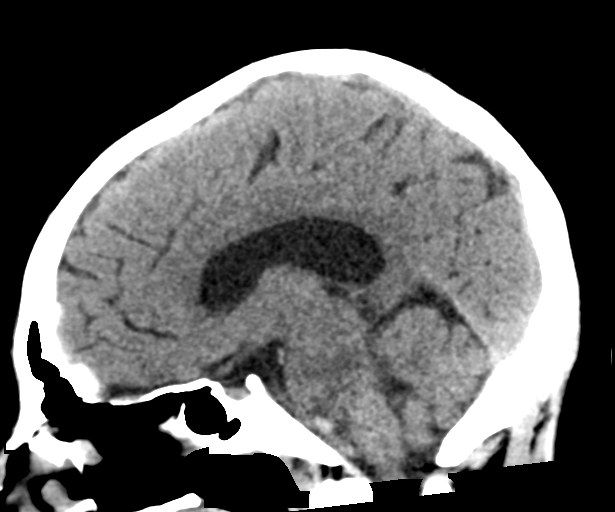
[im 37/56  brain]
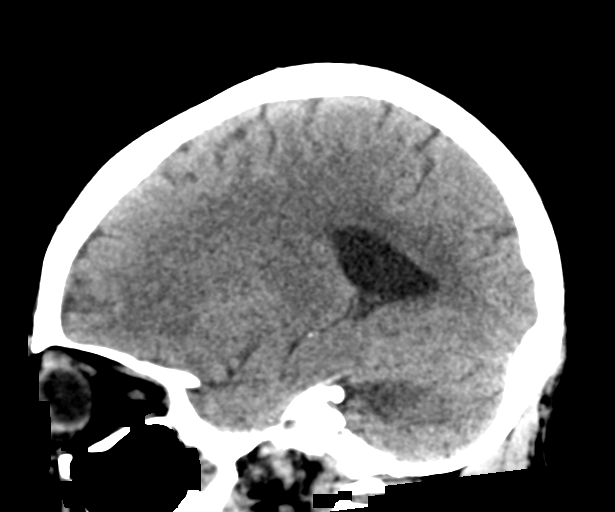

[16 of 47 positions shown; findings below may reference images not displayed]

FINDINGS: Brain: There is developing low-attenuation within the right middle
cerebral peduncle in keeping with an involving infarct or focus of
demyelination better seen on MRI of [DATE]. Remote right
cortical infarct again noted. Lacunar infarct within the right basal
ganglia again noted. Moderate periventricular white matter changes
are present more focal in the region of the atria of the lateral
ventricles bilaterally, possibly the result of small vessel
ischemia. No abnormal intra or extra-axial mass lesion or fluid
collection. No abnormal mass effect or midline shift. No evidence of
acute intracranial hemorrhage or infarct. Ventricular size is
normal. Cerebellum unremarkable.

Vascular: Unremarkable

Skull: Intact

Sinuses/Orbits: Paranasal sinuses are clear. Orbits are
unremarkable.

Other: Mastoid air cells and middle ear cavities are clear.
IMPRESSION: 1. Developing low-attenuation within the right middle cerebral
peduncle in keeping with an evolving infarct or focus of
demyelination better seen on MRI of [DATE].
[DATE]. No evidence of acute intracranial hemorrhage or infarct.
3. Moderate periventricular white matter changes are present more
focal in the region of the atria of the lateral ventricles
bilaterally, possibly the result of small vessel ischemia.

## 2020-02-09 MED ORDER — INSULIN ASPART 100 UNIT/ML ~~LOC~~ SOLN
0.0000 [IU] | Freq: Three times a day (TID) | SUBCUTANEOUS | Status: DC
  Administered 2020-02-10 (×2): 3 [IU] via SUBCUTANEOUS
  Administered 2020-02-11 (×2): 2 [IU] via SUBCUTANEOUS
  Administered 2020-02-11: 5 [IU] via SUBCUTANEOUS
  Administered 2020-02-14: 2 [IU] via SUBCUTANEOUS

## 2020-02-09 MED ORDER — SODIUM CHLORIDE 0.9% FLUSH
3.0000 mL | Freq: Once | INTRAVENOUS | Status: DC
Start: 2020-02-09 — End: 2020-02-14

## 2020-02-09 MED ORDER — ACETAMINOPHEN 160 MG/5ML PO SOLN: 650.0000 mg | ORAL | Status: DC | PRN

## 2020-02-09 MED ORDER — INSULIN ASPART 100 UNIT/ML ~~LOC~~ SOLN: 0.0000 [IU] | Freq: Every day | SUBCUTANEOUS | Status: DC

## 2020-02-09 MED ORDER — GADOBUTROL 1 MMOL/ML IV SOLN
8.0000 mL | Freq: Once | INTRAVENOUS | Status: AC | PRN
  Administered 2020-02-09: 8 mL via INTRAVENOUS

## 2020-02-09 MED ORDER — ATORVASTATIN CALCIUM 80 MG PO TABS
80.0000 mg | ORAL_TABLET | Freq: Every day | ORAL | Status: DC
  Administered 2020-02-09 – 2020-02-14 (×6): 80 mg via ORAL
  Filled 2020-02-09 (×6): qty 1

## 2020-02-09 MED ORDER — ASPIRIN 300 MG RE SUPP: 300.0000 mg | Freq: Every day | RECTAL | Status: DC

## 2020-02-09 MED ORDER — ASPIRIN 325 MG PO TABS
325.0000 mg | ORAL_TABLET | Freq: Every day | ORAL | Status: DC
  Administered 2020-02-09 – 2020-02-10 (×2): 325 mg via ORAL
  Filled 2020-02-09 (×3): qty 1

## 2020-02-09 MED ORDER — STROKE: EARLY STAGES OF RECOVERY BOOK
Freq: Once | Status: DC
  Filled 2020-02-09: qty 1

## 2020-02-09 MED ORDER — ACETAMINOPHEN 325 MG PO TABS
650.0000 mg | ORAL_TABLET | ORAL | Status: DC | PRN
  Administered 2020-02-11 – 2020-02-12 (×3): 650 mg via ORAL
  Filled 2020-02-09 (×3): qty 2

## 2020-02-09 MED ORDER — ENOXAPARIN SODIUM 40 MG/0.4ML ~~LOC~~ SOLN
40.0000 mg | SUBCUTANEOUS | Status: DC
  Administered 2020-02-11: 40 mg via SUBCUTANEOUS
  Filled 2020-02-09 (×4): qty 0.4

## 2020-02-09 MED ORDER — ACETAMINOPHEN 650 MG RE SUPP: 650.0000 mg | RECTAL | Status: DC | PRN

## 2020-02-09 NOTE — ED Notes (Signed)
Pt able to move all four extremities when asked to self transfer from wheelchair to recliner. During NIH assessment, pt was drowsy and wanted to sleep. Unable to complete a full assessment because pt was stating she could not move her left side.

## 2020-02-09 NOTE — ED Notes (Signed)
Tele

## 2020-02-09 NOTE — ED Notes (Signed)
Pt transported to MRI 

## 2020-02-09 NOTE — ED Triage Notes (Signed)
Pt c/o left sided numbness that started this morning (8/25) at 11:30am, reports her speech feels seems worse and her balance is off. Per Dr. Eudelia Bunch, no code stroke at this time.

## 2020-02-09 NOTE — ED Notes (Signed)
Pt refused vitals. Will attempt again in 30 mins.

## 2020-02-09 NOTE — ED Provider Notes (Signed)
[facility] EMERGENCY DEPARTMENT Provider Note   CSN: [id number] Arrival date & time: 02/08/20  2355     History Chief Complaint  Patient presents with  . Numbness    Kaitlyn Gray is a 44 y.o. female with PMHx HTN, Diabetes, Bipolar disorder, and polysubstance abuse who presents to the ED today with complaint of sudden onset, constant, entire left sided weakness and numbness that began at 11:30 AM yesterday (08/25). Pt reports she normally ambulates at home alone without assistive devices however yesterday began dragging her foot. She also complains of speech difficulties and blurry vision in her L eye.   Of note patient diagnosed with right sided CVA in July after presenting for RIGHT sided weakness/numbness, headache, dizziness, unsteady gait, facial droop and slurred speech. Pt had been seen in the ED twice prior without definitive cause. MRI of the brain was positive for acute to subacute infarct at the MCP and adjacent pons; unable to rule out demyelinating region at that time. Neurology consulted with MR brain and C spine with contrast; no signs of demyelination within the C spine. CTA head and neck with plaque within both ICAs, moderate stenosis of the cavernous left ICA, and moderate/severe focal stenosis within a proximal M2 left MCA and more distal MCA branches bilaterally, high grade focal stenosis within the proximal P2 right posterior cerebral artery, and high grade focal stenosis with in the P4 left posterior cerebral artery - thought to be more consistent with ischemic stroke vs MS. It does appear pt ELOPED during hospital stay at that time however.   She presented again earlier this morning for unresponsiveness s/2 drug use. Pt was evaluated by PT at that time after stating she was unable to take care of herself at home however was not a candidate for SNF due to recent cocaine use.   Per friend pt has not been compliant with her medications.   The history  is provided by the patient, medical records and a friend.       Past Medical History:  Diagnosis Date  . Bipolar 1 disorder ([place])   . 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 ([place]) 07/19/2017  . Hypertension     Patient Active Problem List   Diagnosis Date Noted  . Normocytic anemia 02/09/2020  . Ischemic stroke ([place]) 01/01/2020  . Acute encephalopathy 05/12/2018  . Diabetes mellitus without complication ([place]) 93/26/7124  . Hypokalemia 12/06/2017  . Hypertension 12/06/2017  . 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 ([place]) 07/19/2017  . Bipolar disorder ([place]) 07/19/2017  . Polysubstance abuse ([place]) 07/19/2017  . Chest pain 07/19/2017  . Cocaine use disorder, severe, dependence ([place]) 03/24/2015  . Cannabis use disorder, moderate, dependence ([place]) 03/24/2015  . 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 CESAREAN SECTION       OB History   No obstetric history on file.     Family History  Problem Relation Age of Onset  . Hypertension Mother   . CAD Mother 43       died of MI at age 64  . Hypertension Father     Social History   Tobacco Use  . Smoking status: Current Every Day Smoker    Packs/day: 0.50    Types: Cigarettes  . Smokeless tobacco: Never Used  Vaping Use  . Vaping Use: Never used  Substance Use Topics  .  Alcohol use: No  . Drug use: Yes    Types: Marijuana, Cocaine    Home Medications Prior to Admission medications   Medication Sig Start Date End Date Taking? Authorizing Provider  lisinopril (ZESTRIL) 10 MG tablet Take 1 tablet (10 mg total) by mouth daily. 01/20/20 02/19/20 Yes Charlesetta Shanks, MD  metFORMIN (GLUCOPHAGE) 1000 MG tablet Take 1 tablet (1,000 mg total) by mouth 2 (two) times daily with a meal. 01/20/20  Yes Charlesetta Shanks, MD  blood glucose meter kit and supplies KIT Dispense  based on patient and insurance preference. Use up to four times daily as directed. (FOR ICD-9 250.00, 250.01). Please give 3 months supply of lancets and test strips to allow her to check QID. Patient not taking: Reported on 02/09/2020 12/07/17   Roxan Hockey, MD  blood glucose meter kit and supplies Relion Prime or Dispense other brand based on patient and insurance preference. Use up to four times daily as directed. (FOR ICD-9 250.00, 250.01). Patient not taking: Reported on 02/09/2020 12/07/17   Roxan Hockey, MD  ferrous sulfate 325 (65 FE) MG tablet Take 1 tablet (325 mg total) by mouth daily. Patient not taking: Reported on 02/09/2020 01/20/20   Charlesetta Shanks, MD  hydrOXYzine (ATARAX/VISTARIL) 25 MG tablet Take 1 tablet (25 mg total) by mouth every 6 (six) hours as needed. Patient not taking: Reported on 01/01/2020 12/16/19   Sherwood Gambler, MD  Insulin Glargine (LANTUS SOLOSTAR) 100 UNIT/ML Solostar Pen Inject 45 Units into the skin every morning. Patient not taking: Reported on 12/16/2019 12/07/17   Roxan Hockey, MD  insulin lispro (HUMALOG KWIKPEN) 100 UNIT/ML KiwkPen Inject 0-0.2 mLs (0-20 Units total) into the skin 4 (four) times daily -  before meals and at bedtime. Inject 0-20 Units into the skin 3 (three) times daily with meals. CBG 70 - 120: 0 units  CBG 121 - 150: 3 units  CBG 151 - 200: 4 units  CBG 201 - 250: 7 units  CBG 251 - 300: 11 units  CBG 301 - 350: 15 units  CBG 351 - 400: 20 units Patient not taking: Reported on 12/16/2019 12/07/17   Roxan Hockey, MD  Insulin Pen Needle 31G X 5 MM MISC Use as directed Patient not taking: Reported on 02/09/2020 12/07/17   Roxan Hockey, MD    Allergies    Hydrocodone and Latex  Review of Systems   Review of Systems  Constitutional: Negative for chills and fever.  Eyes: Positive for visual disturbance (blurry vision to L eye).  Neurological: Positive for weakness and numbness. Negative for headaches.  All other systems  reviewed and are negative.   Physical Exam Updated Vital Signs BP (!) 160/93 (BP Location: Left Arm)   Pulse 63   Temp 98.1 F (36.7 C) (Oral)   Resp (!) 22   SpO2 100%   Physical Exam Vitals and nursing note reviewed.  Constitutional:      Appearance: She is not ill-appearing or diaphoretic.     Comments: Disheveled appearance  HENT:     Head: Normocephalic and atraumatic.  Eyes:     Extraocular Movements: Extraocular movements intact.     Conjunctiva/sclera: Conjunctivae normal.     Pupils: Pupils are equal, round, and reactive to light.  Cardiovascular:     Rate and Rhythm: Normal rate and regular rhythm.     Pulses: Normal pulses.  Pulmonary:     Effort: Pulmonary effort is normal.     Breath sounds: Normal breath sounds. No wheezing,  rhonchi or rales.  Abdominal:     Palpations: Abdomen is soft.     Tenderness: There is no abdominal tenderness. There is no guarding or rebound.  Musculoskeletal:     Cervical back: Neck supple.  Skin:    General: Skin is warm and dry.  Neurological:     Mental Status: She is alert and oriented to person, place, and time.     Comments: Alert and oriented x 4 Residual right sided facial droop.  Dysarthric speech.  Pt moving her left arm while speaking however immediately upon exam her LUE becomes flaccid. With arm drop over head the arm moves to the side of patient's body several times.  Unable to assess for strength to LUE and LLE. Reports decreased sensation to this side.  Strength 4/5 to RUE and RLE.       ED Results / Procedures / Treatments   Labs (all labs ordered are listed, but only abnormal results are displayed) Labs Reviewed  CBC - Abnormal; Notable for the following components:      Result Value   Hemoglobin 10.6 (*)    HCT 35.2 (*)    MCH 25.7 (*)    RDW 20.1 (*)    All other components within normal limits  COMPREHENSIVE METABOLIC PANEL - Abnormal; Notable for the following components:   Glucose, Bld 159 (*)     Creatinine, Ser 1.11 (*)    Total Bilirubin 0.2 (*)    GFR calc non Af Amer 60 (*)    All other components within normal limits  RAPID URINE DRUG SCREEN, HOSP PERFORMED - Abnormal; Notable for the following components:   Cocaine POSITIVE (*)    Tetrahydrocannabinol POSITIVE (*)    All other components within normal limits  I-STAT CHEM 8, ED - Abnormal; Notable for the following components:   Creatinine, Ser 1.10 (*)    Glucose, Bld 154 (*)    Hemoglobin 11.6 (*)    HCT 34.0 (*)    All other components within normal limits  CBG MONITORING, ED - Abnormal; Notable for the following components:   Glucose-Capillary 142 (*)    All other components within normal limits  SARS CORONAVIRUS 2 BY RT PCR (HOSPITAL ORDER, [facility])  PROTIME-INR  APTT  DIFFERENTIAL  ETHANOL  I-STAT BETA HCG BLOOD, ED ([facility], [facility], [facility] ONLY)  CBG MONITORING, ED    EKG EKG Interpretation  Date/Time:  Thursday February 09 2020 00:05:47 EDT Ventricular Rate:  72 PR Interval:  142 QRS Duration: 102 QT Interval:  412 QTC Calculation: 451 R Axis:   41 Text Interpretation: Normal sinus rhythm Normal ECG No STEMI Confirmed by Octaviano Glow [phone]) on 02/09/2020 2:32:55 PM   Radiology CT HEAD WO CONTRAST  Result Date: 02/09/2020 CLINICAL DATA:  Left-sided numbness, ataxia, dysarthria EXAM: CT HEAD WITHOUT CONTRAST TECHNIQUE: Contiguous axial images were obtained from the base of the skull through the vertex without intravenous contrast. COMPARISON:  02/01/2020 FINDINGS: Brain: There is developing low-attenuation within the right middle cerebral peduncle in keeping with an involving infarct or focus of demyelination better seen on MRI of 01/01/2020. Remote right cortical infarct again noted. Lacunar infarct within the right basal ganglia again noted. Moderate periventricular white matter changes are present more focal in the region of the atria of the lateral ventricles bilaterally, possibly  the result of small vessel ischemia. No abnormal intra or extra-axial mass lesion or fluid collection. No abnormal mass effect or midline shift. No evidence  of acute intracranial hemorrhage or infarct. Ventricular size is normal. Cerebellum unremarkable. Vascular: Unremarkable Skull: Intact Sinuses/Orbits: Paranasal sinuses are clear. Orbits are unremarkable. Other: Mastoid air cells and middle ear cavities are clear. IMPRESSION: 1. Developing low-attenuation within the right middle cerebral peduncle in keeping with an evolving infarct or focus of demyelination better seen on MRI of 01/01/2020. 2. No evidence of acute intracranial hemorrhage or infarct. 3. Moderate periventricular white matter changes are present more focal in the region of the atria of the lateral ventricles bilaterally, possibly the result of small vessel ischemia. Electronically Signed   By: Fidela Salisbury MD   On: 02/09/2020 01:17   MR Brain W and Wo Contrast  Result Date: 02/09/2020 CLINICAL DATA:  Left-sided numbness EXAM: MRI HEAD WITHOUT AND WITH CONTRAST TECHNIQUE: Multiplanar, multiecho pulse sequences of the brain and surrounding structures were obtained without and with intravenous contrast. CONTRAST:  54m GADAVIST GADOBUTROL 1 MMOL/ML IV SOLN COMPARISON:  01/02/2020 FINDINGS: Brain: There is new restricted diffusion at the right aspect of the pons. No associated enhancement. There has been evolution of the abnormal diffusion and T2 hyperintensity and resolution of enhancement centered within the right middle cerebellar peduncle. Foci of susceptibility are again identified within the thalamus bilaterally likely reflecting chronic microhemorrhages. A few other punctate foci are present in the cerebral white matter and cerebellum. Patchy and confluent areas of T2 hyperintensity in the supratentorial and pontine white matter are nonspecific but may reflect moderate chronic microvascular ischemic changes. Ventricles are stable in size.  No intracranial mass or significant mass effect. Vascular: Major vessel flow voids at the skull base are preserved. Skull and upper cervical spine: Normal marrow signal is preserved. Sinuses/Orbits: Mild mucosal thickening. Chronic deformity of the right medial orbital wall. Other: Sella is unremarkable.  Mastoid air cells are clear. IMPRESSION: Acute right pontine infarct. Evolution of previous abnormality centered within the right middle cerebellar peduncle, which was likely also ischemic in etiology. Stable additional findings detailed above. Electronically Signed   By: PMacy MisM.D.   On: 02/09/2020 15:39    Procedures Procedures (including critical care time)  Medications Ordered in ED Medications  sodium chloride flush (NS) 0.9 % injection 3 mL (has no administration in time range)  gadobutrol (GADAVIST) 1 MMOL/ML injection 8 mL (8 mLs Intravenous Contrast Given 02/09/20 1519)    ED Course  I have reviewed the triage vital signs and the nursing notes.  Pertinent labs & imaging results that were available during my care of the patient were reviewed by me and considered in my medical decision making (see chart for details).    MDM Rules/Calculators/A&P                          44year old female with history of right-sided CVA who presents to the ED today complaining of sudden onset left-sided weakness/numbness.  She was evaluated in triage 12 hours ago and noted no code stroke was called, symptoms started 12 hours prior to arrival.  CT head was done while patient was in the waiting room with findings of developing low attenuation in the right MCP better seen on MRI on 07/18.  On arrival to the ED patient is afebrile, nontachycardic and nontachypneic.  Noted to have high blood pressure of 191/102 however does appear to be decreasing.  Repeat prior to coming back to room 160/93.  Lab work reassuring.  On exam patient is noted to have dysarthric speech.  She is  moving her left upper  extremity while speaking with me however during examination her left arm becomes flaccid.  I have placed her left arm over her head several times and dropped and patient was able to avoid her face multiple times.  She states that she cannot move her left leg whatsoever and therefore I am unable to assess strength.  Strength 4 out of 5 to right upper and right lower extremity.  Patient has a residual right-sided facial droop from a previous stroke in July.   Discussed with neurologist Dr. Cheral Marker who recommends MRI brain with and without contrast and if any acute findings to reconsult.  MRI brain with findings of acute pontine infarction on the right side. Will reconsult neurology at this time.   Dr. Cheral Marker has evaluated patient and recommends hospitalist admission. He will place recommends in his consult note.   Discussed case with Dr. Doristine Bosworth with [facility] who agrees to accept patient for admission.   This note was prepared using Dragon voice recognition software and may include unintentional dictation errors due to the inherent limitations of voice recognition software.  Final Clinical Impression(s) / ED Diagnoses Final diagnoses:  Cerebrovascular accident (CVA) due to occlusion of cerebral artery ([place])  Left sided numbness    Rx / DC Orders ED Discharge Orders    None       Eustaquio Maize, PA-C 02/09/20 1655    Wyvonnia Dusky, MD 02/09/20 1827

## 2020-02-09 NOTE — ED Provider Notes (Signed)
44 yo female presenting to ED with left sided weakness and facial numbness, onset 12 hours prior to arrival.  Patient had been admitted a few months ago for acute stroke with residual RIGHT sided weakness and had left AMA.  She came back last night with these acute onset of symptoms.  She had a CTH ordered at 0100 which showed a possible right middle cerebral peduncle suggestive of infarct vs demyelinating lesion.  I was not on duty at the time of this triage and CT imaging.  She did unfortunately spend 12 hours in the waiting room overnight prior to being evaluate by myself and my PA at noon.  On our exam she did demonstrate inconsistencies claiming she could not move her left arm at all, but when distracted was lifting and moving her left arm freely.  She had some slow speech and poor motor effort in LLE.    She had slow speech but no aphasia, no gaze deviation preference, no neglect on exam to suggest LVO.    We obtained an MRI brain which was concerning for an acute infarct.  Neurology has been consulted, and patient will need admission back to the hospital.   Terald Sleeper, MD 02/09/20 [phone]

## 2020-02-09 NOTE — ED Notes (Signed)
Chase Picket [phone] would like an update on the pt

## 2020-02-09 NOTE — ED Notes (Signed)
Pt refusing to follow commands when this RN attempted to assess neuro function, refusing medications at this time.

## 2020-02-09 NOTE — H&P (Addendum)
History and Physical    Kaitlyn Gray Kaitlyn Gray DOB: [birth year] DOA: 02/08/2020  PCP: Patient, No Pcp Per  Patient coming from: Home  I have personally briefly reviewed patient's old medical records in [place]  Chief Complaint: Left-sided weakness and facial numbness  HPI: Kaitlyn Gray is a 44 y.o. 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  She is not really good historian.  She answered yes to almost all of my questions including headache, blurry vision, lightheadedness, dizziness, chest pain, shortness of breath, palpitation, nausea, vomiting, abdominal pain.  She denies fever, chills, cough, congestion.  She tells me that she received 1 dose of COVID-19 vaccine.  Of note: Patient admitted in July 2021 with ischemic stroke however she left AMA.  EEG was done which came back negative for acute findings, transthoracic echo showed ejection fraction of 55 to 60%, no PFO.  She is noncompliant with her medications.   She continues to smoke 1 pack of cigarettes per day, uses marijuana and cocaine however denies alcohol use.  She uses walker for ambulation.  ED Course: Upon arrival to ED: Patient's blood pressure was noted to be elevated, afebrile with no leukocytosis, CBC shows normocytic anemia, UDS positive for cocaine and marijuana, ethanol: Pending, COVID-19: Pending.  CT head shows developing low-attenuation with in right middle cerebral peduncle in keeping with an evolving infarct or focus of demyelination.  No hemorrhage.  MRI brain shows acute right pontine infarct.  EDP consulted neurology.  [facility] hospitalist consulted for admission for  ischemic stroke.  Review of Systems: As per HPI otherwise negative.    Past Medical History:  Diagnosis Date  . Bipolar 1 disorder ([place])   . 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 ([place]) 07/19/2017  . Hypertension     Past Surgical History:  Procedure Laterality Date  . CESAREAN SECTION       reports that she has been smoking cigarettes. She has been smoking about 0.50 packs per day. She has never used smokeless tobacco. She reports current drug use. Drugs: Marijuana and Cocaine. She reports that she does not drink alcohol.  Allergies  Allergen Reactions  . Hydrocodone Itching  . Latex Rash    Family History  Problem Relation Age of Onset  . Hypertension Mother   . CAD Mother 49       died of MI at age 48  . Hypertension Father     Prior to Admission medications   Medication Sig Start Date End Date Taking? Authorizing Provider  lisinopril (ZESTRIL) 10 MG tablet Take 1 tablet (10 mg total) by mouth daily. 01/20/20 02/19/20 Yes Charlesetta Shanks, MD  metFORMIN (GLUCOPHAGE) 1000 MG tablet Take 1 tablet (1,000 mg total) by mouth 2 (two) times daily with a meal. 01/20/20  Yes Charlesetta Shanks, MD  blood glucose meter kit and supplies KIT Dispense based on patient and insurance preference. Use up to four times daily as directed. (FOR ICD-9 250.00, 250.01). Please give 3 months supply of lancets and test strips to allow her to check QID. Patient not taking: Reported on 02/09/2020 12/07/17   Roxan Hockey, MD  blood glucose meter kit and supplies Relion Prime or Dispense other brand based on patient and insurance preference. Use up to  four times daily as directed. (FOR ICD-9 250.00, 250.01). Patient not taking: Reported on 02/09/2020 12/07/17   Roxan Hockey, MD  ferrous sulfate 325 (65 FE) MG tablet Take 1 tablet (325 mg total) by mouth daily. Patient not taking: Reported on 02/09/2020 01/20/20   Charlesetta Shanks, MD  hydrOXYzine (ATARAX/VISTARIL) 25 MG tablet  Take 1 tablet (25 mg total) by mouth every 6 (six) hours as needed. Patient not taking: Reported on 01/01/2020 12/16/19   Sherwood Gambler, MD  Insulin Glargine (LANTUS SOLOSTAR) 100 UNIT/ML Solostar Pen Inject 45 Units into the skin every morning. Patient not taking: Reported on 12/16/2019 12/07/17   Roxan Hockey, MD  insulin lispro (HUMALOG KWIKPEN) 100 UNIT/ML KiwkPen Inject 0-0.2 mLs (0-20 Units total) into the skin 4 (four) times daily -  before meals and at bedtime. Inject 0-20 Units into the skin 3 (three) times daily with meals. CBG 70 - 120: 0 units  CBG 121 - 150: 3 units  CBG 151 - 200: 4 units  CBG 201 - 250: 7 units  CBG 251 - 300: 11 units  CBG 301 - 350: 15 units  CBG 351 - 400: 20 units Patient not taking: Reported on 12/16/2019 12/07/17   Roxan Hockey, MD  Insulin Pen Needle 31G X 5 MM MISC Use as directed Patient not taking: Reported on 02/09/2020 12/07/17   Roxan Hockey, MD    Physical Exam: Vitals:   02/09/20 1048 02/09/20 1219 02/09/20 1222 02/09/20 1637  BP: (!) 160/93 (!) 162/89  (!) 176/100  Pulse: 63 62  62  Resp: (!) 22 20  (!) 22  Temp:      TempSrc:      SpO2: 100% 99% 100% 100%  Height:  _0  (1.626 m)      Constitutional: NAD, calm, comfortable, on room air, morbidly obese Eyes: PERRL, lids and conjunctivae normal ENMT: Mucous membranes are moist. Posterior pharynx clear of any exudate or lesions.Normal dentition.  Neck: normal, supple, no masses, no thyromegaly Respiratory: clear to auscultation bilaterally, no wheezing, no crackles. Normal respiratory effort. No accessory muscle use.  Cardiovascular: Regular rate and rhythm, no murmurs / rubs / gallops. No extremity edema. 2+ pedal pulses. No carotid bruits.  Abdomen: no tenderness, no masses palpated. No hepatosplenomegaly. Bowel sounds positive.  Musculoskeletal: no clubbing / cyanosis. No joint deformity upper and lower extremities. Good ROM, no contractures. Normal muscle tone.  Skin: no  rashes, lesions, ulcers. No induration Neurologic: Slurred speech, slight left-sided facial droop, power 1 out of 5 in left upper and lower extremity, 3 out of 5 in right upper and 2 out of 5 in right lower extremity. psychiatric: Normal judgment and insight. Alert and oriented x 3. Normal mood.    Labs on Admission: I have personally reviewed following labs and imaging studies  CBC: Recent Labs  Lab 02/09/20 0017 02/09/20 0029  WBC 4.9  --   NEUTROABS 2.9  --   HGB 10.6* 11.6*  HCT 35.2* 34.0*  MCV 85.2  --   PLT 302  --    Basic Metabolic Panel: Recent Labs  Lab 02/09/20 0017 02/09/20 0029  NA 139 143  K 3.7 3.5  CL 106 106  CO2 23  --   GLUCOSE 159* 154*  BUN 10 11  CREATININE 1.11* 1.10*  CALCIUM 9.0  --    GFR: CrCl cannot be calculated (Unknown ideal weight.). Liver Function Tests: Recent Labs  Lab 02/09/20 0017  AST 15  ALT 13  ALKPHOS 65  BILITOT 0.2*  PROT 6.8  ALBUMIN 3.5   No results for input(s): LIPASE, AMYLASE in the last 168 hours. No results for input(s): AMMONIA in the last 168 hours. Coagulation Profile: Recent Labs  Lab 02/09/20 0017  INR 1.0   Cardiac Enzymes: No results for input(s): CKTOTAL, CKMB, CKMBINDEX, TROPONINI in the last 168 hours. BNP (last 3 results) No results for input(s): PROBNP in the last 8760 hours. HbA1C: No results for input(s): HGBA1C in the last 72 hours. CBG: Recent Labs  Lab 02/09/20 1239  GLUCAP 142*   Lipid Profile: No results for input(s): CHOL, HDL, LDLCALC, TRIG, CHOLHDL, LDLDIRECT in the last 72 hours. Thyroid Function Tests: No results for input(s): TSH, T4TOTAL, FREET4, T3FREE, THYROIDAB in the last 72 hours. Anemia Panel: No results for input(s): VITAMINB12, FOLATE, FERRITIN, TIBC, IRON, RETICCTPCT in the last 72 hours. Urine analysis:    Component Value Date/Time   COLORURINE YELLOW 02/01/2020 2240   APPEARANCEUR CLEAR 02/01/2020 2240   LABSPEC 1.016 02/01/2020 2240   PHURINE 6.0  02/01/2020 2240   GLUCOSEU NEGATIVE 02/01/2020 2240   HGBUR NEGATIVE 02/01/2020 2240   BILIRUBINUR NEGATIVE 02/01/2020 2240   KETONESUR NEGATIVE 02/01/2020 2240   PROTEINUR NEGATIVE 02/01/2020 2240   UROBILINOGEN 0.2 10/06/2014 0856   NITRITE NEGATIVE 02/01/2020 2240   LEUKOCYTESUR NEGATIVE 02/01/2020 2240    Radiological Exams on Admission: CT HEAD WO CONTRAST  Result Date: 02/09/2020 CLINICAL DATA:  Left-sided numbness, ataxia, dysarthria EXAM: CT HEAD WITHOUT CONTRAST TECHNIQUE: Contiguous axial images were obtained from the base of the skull through the vertex without intravenous contrast. COMPARISON:  02/01/2020 FINDINGS: Brain: There is developing low-attenuation within the right middle cerebral peduncle in keeping with an involving infarct or focus of demyelination better seen on MRI of 01/01/2020. Remote right cortical infarct again noted. Lacunar infarct within the right basal ganglia again noted. Moderate periventricular white matter changes are present more focal in the region of the atria of the lateral ventricles bilaterally, possibly the result of small vessel ischemia. No abnormal intra or extra-axial mass lesion or fluid collection. No abnormal mass effect or midline shift. No evidence of acute intracranial hemorrhage or infarct. Ventricular size is normal. Cerebellum unremarkable. Vascular: Unremarkable Skull: Intact Sinuses/Orbits: Paranasal sinuses are clear. Orbits are unremarkable. Other: Mastoid air cells and middle ear cavities are clear. IMPRESSION: 1. Developing low-attenuation within the right middle cerebral peduncle in keeping with an evolving infarct or focus of demyelination better seen on MRI of 01/01/2020. 2. No evidence of acute intracranial hemorrhage or infarct. 3. Moderate periventricular white matter changes are present more focal in the region of the atria of the lateral ventricles bilaterally, possibly the result of small vessel ischemia. Electronically Signed    By: Fidela Salisbury MD   On: 02/09/2020 01:17   MR Brain W and Wo Contrast  Result Date: 02/09/2020 CLINICAL DATA:  Left-sided numbness EXAM: MRI HEAD WITHOUT AND WITH CONTRAST TECHNIQUE: Multiplanar, multiecho pulse sequences of the brain and surrounding structures were obtained without and with intravenous contrast. CONTRAST:  71m GADAVIST GADOBUTROL 1 MMOL/ML IV SOLN COMPARISON:  01/02/2020 FINDINGS: Brain: There is new restricted diffusion at the right aspect of the pons. No associated enhancement. There has been evolution of the abnormal diffusion and T2 hyperintensity and resolution of enhancement centered within the right middle cerebellar peduncle. Foci of susceptibility are again identified within the thalamus bilaterally likely reflecting chronic microhemorrhages. A few other punctate foci are present in the cerebral white matter and cerebellum. Patchy and  confluent areas of T2 hyperintensity in the supratentorial and pontine white matter are nonspecific but may reflect moderate chronic microvascular ischemic changes. Ventricles are stable in size. No intracranial mass or significant mass effect. Vascular: Major vessel flow voids at the skull base are preserved. Skull and upper cervical spine: Normal marrow signal is preserved. Sinuses/Orbits: Mild mucosal thickening. Chronic deformity of the right medial orbital wall. Other: Sella is unremarkable.  Mastoid air cells are clear. IMPRESSION: Acute right pontine infarct. Evolution of previous abnormality centered within the right middle cerebellar peduncle, which was likely also ischemic in etiology. Stable additional findings detailed above. Electronically Signed   By: Macy Mis M.D.   On: 02/09/2020 15:39    EKG: Independently reviewed.  Normal sinus rhythm.  No ST elevation or depression noted.  Assessment/Plan Principal Problem:   Ischemic stroke [facility]) Active Problems:   Cocaine use disorder, severe, dependence ([facility])   Cannabis use  disorder, moderate, dependence ([place])   Diabetes mellitus without complication ([place])   Hypertension   Normocytic anemia   Ischemic stroke: -Risk factors including hypertension, diabetes, previous history of stroke, noncompliance with medications.  Polysubstance abuse.  Reviewed CT head and MRI brain. -admit forTelemetry monitoring -Stroke protocol -Allow for permissive hypertension for the first 24-48h - only treat PRN if SBP >564 mmHg or diastolic blood pressure >332. Blood pressures can be gradually normalized to SBP<140 upon discharge. -Start on aspirin and high intensity statin -Frequent neuro checks -Check A1c, lipid panel -Risk factor modification -Consult Neurology -PT/OT eval, Speech consult  Hypertension: Blood pressure is elevated upon arrival -Hold home BP meds lisinopril to allow permissive hypertension.  Monitor blood pressure closely  Type 2 diabetes mellitus: Check A1c -Noncompliant with home medication.  Hold Metformin.  Started patient on sliding scale insulin.  Last A1c 7.5%.  Normocytic anemia: H&H is stable.  Continue to monitor.  Polysubstance abuse, UDS is positive for marijuana and cocaine.  Counseled about cessation.  Tobacco abuse: Counseled about cessation however she is not ready to quit yet.  Morbid obesity: -Diet modification/exercise/weight loss recommended  Bipolar disorder: Reviewed home meds.  Does not take any medications for bipolar at home. -Outpatient psych follow-up recommended.  DVT prophylaxis: Lovenox/SCD Code Status: Full code  family Communication: None present at bedside.  Plan of care discussed with patient in length and she verbalized understanding and agreed with it. Disposition Plan: Home/SNF in 2 to 3 days Consults called: Neurology by EDP Admission status: Inpatient   Mckinley Jewel MD [facility]  If 7PM-7AM, please contact night-coverage [email or web address] Password [facility]  02/09/2020, 4:56 PM

## 2020-02-09 NOTE — Consult Note (Addendum)
Neurology Consultation  Reason for Consult: Stroke  Referring Physician: Wyvonnia Dusky, MD  CC: stroke  History is obtained from:chart and patient  HPI: Kaitlyn Gray is a 44 y.o. female with history of stroke back in July 2021, hypertension, diabetes and bipolar 1 disorder.  Patient presented to the ED with left-sided weakness and facial numbness, onset 12 hours prior to arrival.  Patient had been admitted a few months ago for acute stroke with residual right-sided weakness, and at that time had left AMA.  Patient did come back last night with acute onset of symptoms on the left.  CT of head was ordered at 1 AM today which showed a possible right middle cerebral peduncle suggestive of infarct versus demyelinating disease.  MRI did show an acute right pontine infarct and evolving right MCP stroke.  Thus neurology was called to see patient.  It should be noted that patient and boyfriend state that she has not been taking her medications.  She states that she was prescribed ASA. She states she could not get her prescriptions filled due to inability to get transportation.  Previous work-up: Patient was seen on July 2021 during which imaging did show a stroke.  Work-up included: -EEG which showed no epileptiform activity -Echocardiogram which showed 55 to 60% ejection fraction with left ventricle normal function.  No PFO. -CTA of head and neck showed the common carotid, internal carotid and vertebral arteries are patent within the neck without hemodynamically significant stenosis.  Mixed plaque within both intracranial internal carotid arteries.  Moderate stenosis of the cavernous left ICA.  Moderate to severe focal stenosis within a proximal M2 left MCA branch vessel.  Additional irregularity of the M2 and more distal MCA branch bilaterally.  High-grade focal stenosis of the proximal P2 right posterior cerebral artery.  Additionally, the distal right posterior cerebral artery is poorly  delineated if suggestive of high-grade stenosis. -Hemoglobin A1c 7.5 -LDL 122  LKW: 11 am 8/25 tpa given?: no, out of window Premorbid modified Rankin scale (mRS): 0  NIHSS 1a Level of Conscious.: 0 1b LOC Questions: 1 1c LOC Commands: 0 2 Best Gaze: 0 3 Visual: 0 4 Facial Palsy: 1 5a Motor Arm - left: 0 5b Motor Arm - Right: 0 6a Motor Leg - Left: 0 6b Motor Leg - Right: 0 7 Limb Ataxia: 2 8 Sensory: 2 9 Best Language: 0 10 Dysarthria: 1 11 Extinct. and Inatten.: 1 TOTAL: 8   Past Medical History:  Diagnosis Date  . Bipolar 1 disorder ([place])   . 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 Hypertension    Family History  Problem Relation Age of Onset  . Hypertension Mother   . CAD Mother 3       died of MI at age 78  . Hypertension Father    Social History:   reports that she has been smoking cigarettes. She has been smoking about 0.50 packs per day. She has never used smokeless tobacco. She reports current drug use. Drugs: Marijuana and Cocaine. She reports that she does not drink alcohol.  Medications  Current Facility-Administered Medications:  .  sodium chloride flush (NS) 0.9 % injection 3 mL, 3 mL, Intravenous, Once, Trifan, Carola Rhine, MD  Current Outpatient Medications:  .  lisinopril (ZESTRIL) 10 MG tablet, Take 1 tablet (10 mg total) by mouth daily., Disp: 30 tablet, Rfl: 0 .  metFORMIN (GLUCOPHAGE) 1000 MG tablet, Take 1 tablet (1,000 mg total)  by mouth 2 (two) times daily with a meal., Disp: 60 tablet, Rfl: 0 .  blood glucose meter kit and supplies KIT, Dispense based on patient and insurance preference. Use up to four times daily as directed. (FOR ICD-9 250.00, 250.01). Please give 3 months supply of lancets and test strips to allow her to check QID. (Patient not taking: Reported on 02/09/2020), Disp: 1 each, Rfl: 0 .  blood glucose meter kit and supplies, [employer] or Dispense other brand based on patient  and insurance preference. Use up to four times daily as directed. (FOR ICD-9 250.00, 250.01). (Patient not taking: Reported on 02/09/2020), Disp: 1 each, Rfl: 3 .  ferrous sulfate 325 (65 FE) MG tablet, Take 1 tablet (325 mg total) by mouth daily. (Patient not taking: Reported on 02/09/2020), Disp: 30 tablet, Rfl: 0 .  hydrOXYzine (ATARAX/VISTARIL) 25 MG tablet, Take 1 tablet (25 mg total) by mouth every 6 (six) hours as needed. (Patient not taking: Reported on 01/01/2020), Disp: 30 tablet, Rfl: 0 .  Insulin Glargine (LANTUS SOLOSTAR) 100 UNIT/ML Solostar Pen, Inject 45 Units into the skin every morning. (Patient not taking: Reported on 12/16/2019), Disp: 15 mL, Rfl: 1 .  insulin lispro (HUMALOG KWIKPEN) 100 UNIT/ML KiwkPen, Inject 0-0.2 mLs (0-20 Units total) into the skin 4 (four) times daily -  before meals and at bedtime. Inject 0-20 Units into the skin 3 (three) times daily with meals. CBG 70 - 120: 0 units  CBG 121 - 150: 3 units  CBG 151 - 200: 4 units  CBG 201 - 250: 7 units  CBG 251 - 300: 11 units  CBG 301 - 350: 15 units  CBG 351 - 400: 20 units (Patient not taking: Reported on 12/16/2019), Disp: 15 mL, Rfl: 11 .  Insulin Pen Needle 31G X 5 MM MISC, Use as directed (Patient not taking: Reported on 02/09/2020), Disp: 100 each, Rfl: 2 The patient states that she was prescribed ASA at last discharge but did not have her medications filled  ROS: Unable to obtain a detailed ROS due to patient partial compliance with interview. Other ROS as per HPI.   Exam: Current vital signs: BP (!) 176/100 (BP Location: Right Arm)   Pulse 62   Temp 98.1 F (36.7 C) (Oral)   Resp (!) 22   Ht '5\' 4"'  (1.626 m)   SpO2 100%   BMI 32.79 kg/m  Vital signs in last 24 hours: Temp:  [98 F (36.7 C)-98.3 F (36.8 C)] 98.1 F (36.7 C) (08/26 0659) Pulse Rate:  [51-118] 62 (08/26 1637) Resp:  [14-22] 22 (08/26 1637) BP: (134-191)/(81-123) 176/100 (08/26 1637) SpO2:  [96 %-100 %] 100 % (08/26 1637)   Constitutional:  Appears well-developed and well-nourished.  Eyes: No scleral injection HENT: No OP obstrucion Head: Normocephalic.  Respiratory: Effort normal, non-labored breathing GI: Soft.  No distension. There is no tenderness.  Skin: WDI  Neuro: Mental Status: Patient is awake, alert, oriented to the day, however thinks it is 50, initially stated June but corrected herself to August, aware that she is in [place], [place], naming is intact, repetition is intact and speech shows dysarthria with decreased fluency.  There is also embellishment of some of her speech which is of a baby-like/child-like quality. Cranial Nerves: II: Visual Fields are full. PERRL III,IV, VI: EOMI without ptosis or diploplia.  V: Facial sensation is symmetric to temperature VII: Left facial droop VIII: hearing is intact to voice X: Dysarthric speech XI: Shoulder shrug is symmetric.  XII: tongue is difficult to assess as patient does not completely protrude.  Motor: Poor effort with maximum elicitable strength of 4/5 in all four extremities.  Patient does show some effort dependency and giveaway weakness Sensory: Decreased to temperature to left arm and leg Decreased to light touch to left arm and leg Extinction on the left Deep Tendon Reflexes: 2+ and symmetric in the biceps and patellae.  Plantars: Toes are downgoing bilaterally.  Cerebellar: Finger-nose-finger shows dysmetria bilaterally  Labs I have reviewed labs in epic and the results pertinent to this consultation are:   CBC    Component Value Date/Time   WBC 4.9 02/09/2020 0017   RBC 4.13 02/09/2020 0017   HGB 11.6 (L) 02/09/2020 0029   HCT 34.0 (L) 02/09/2020 0029   PLT 302 02/09/2020 0017   MCV 85.2 02/09/2020 0017   MCH 25.7 (L) 02/09/2020 0017   MCHC 30.1 02/09/2020 0017   RDW 20.1 (H) 02/09/2020 0017   LYMPHSABS 1.5 02/09/2020 0017   MONOABS 0.3 02/09/2020 0017   EOSABS 0.2 02/09/2020 0017   BASOSABS 0.0 02/09/2020 0017     CMP     Component Value Date/Time   NA 143 02/09/2020 0029   K 3.5 02/09/2020 0029   CL 106 02/09/2020 0029   CO2 23 02/09/2020 0017   GLUCOSE 154 (H) 02/09/2020 0029   BUN 11 02/09/2020 0029   CREATININE 1.10 (H) 02/09/2020 0029   CALCIUM 9.0 02/09/2020 0017   PROT 6.8 02/09/2020 0017   ALBUMIN 3.5 02/09/2020 0017   ALBUMIN 3.7 (L) 01/03/2020 1122   AST 15 02/09/2020 0017   ALT 13 02/09/2020 0017   ALKPHOS 65 02/09/2020 0017   BILITOT 0.2 (L) 02/09/2020 0017   GFRNONAA 60 (L) 02/09/2020 0017   GFRAA >60 02/09/2020 0017    Lipid Panel     Component Value Date/Time   CHOL 181 01/02/2020 0504   TRIG 53 01/02/2020 0504   HDL 48 01/02/2020 0504   CHOLHDL 3.8 01/02/2020 0504   VLDL 11 01/02/2020 0504   LDLCALC 122 (H) 01/02/2020 0504     Imaging I have reviewed the images obtained:  CT-scan of the brain-developing low-attenuation within the right middle cerebral peduncle in keeping with an evolving infarct or focus of demyelination.  No evidence of acute intracranial hemorrhage or infarct.  MRI examination of the brain-acute right pontine infarct.  Evolution of previous abnormality centered within the right middle cerebellar peduncle, which is likely also ischemic in etiology.  Etta Quill PA-C [facility] [phone] 02/09/2020, 4:45 PM    Assessment: 44 year old female who presents to the hospital with left-sided weakness and dysarthria.  Patient obtained MRI which did show acute right pontine infarct.  On exam she does have decreased sensation to cold and light touch on the left arm and leg along with bilateral dysmetria finger-to-nose. She also shows a left facial droop.   -- Patient has in the past had a hypercoagulable work-up which was negative.  -- Recent imaging studies from stroke work-up which was done July 2021 as described in the HPI. -- Not classifiable as having failed ASA as she was noncompliant.    Impression: -Stroke  Recommendations: -Start aspirin 325 daily -Frequent neuro checks --PT/OT/Speech --May need inpatient rehab --Glycemic control --Prevent fever with Tylenol PRN --IVF --Encourage compliance with medication regimen and healthy habits --Atorvastatin 40 mg po qd --Obtain baseline CK level --BP management.  --May need Case Manager consult due to transportation issues preventing her from obtaining medications --Cardiac  telemetry  I have seen and examined the patient. I have formulated the assessment and recommendations. 44 year old female who presents to the hospital with left-sided weakness and dysarthria.  Patient obtained MRI which did show acute right pontine infarct.  On exam she does have decreased sensation to cold and light touch on the left arm and leg along with bilateral dysmetria finger-to-nose. She also shows a left facial droop.  Stroke management as above.  Electronically signed: Dr. Kerney Elbe

## 2020-02-09 NOTE — ED Notes (Signed)
Pt not in waiting room

## 2020-02-10 DIAGNOSIS — F1220 Cannabis dependence, uncomplicated: Secondary | ICD-10-CM

## 2020-02-10 DIAGNOSIS — Z8673 Personal history of transient ischemic attack (TIA), and cerebral infarction without residual deficits: Secondary | ICD-10-CM

## 2020-02-10 DIAGNOSIS — D649 Anemia, unspecified: Secondary | ICD-10-CM

## 2020-02-10 DIAGNOSIS — I10 Essential (primary) hypertension: Secondary | ICD-10-CM

## 2020-02-10 DIAGNOSIS — E119 Type 2 diabetes mellitus without complications: Secondary | ICD-10-CM

## 2020-02-10 DIAGNOSIS — F1420 Cocaine dependence, uncomplicated: Secondary | ICD-10-CM

## 2020-02-10 LAB — BASIC METABOLIC PANEL
Anion gap: 8 (ref 5–15)
BUN: 11 mg/dL (ref 6–20)
CO2: 22 mmol/L (ref 22–32)
Calcium: 8.6 mg/dL — ABNORMAL LOW (ref 8.9–10.3)
Chloride: 108 mmol/L (ref 98–111)
Creatinine, Ser: 0.96 mg/dL (ref 0.44–1.00)
GFR calc Af Amer: >60 mL/min (ref >60)
GFR calc non Af Amer: >60 mL/min (ref >60)
Glucose, Bld: 132 mg/dL — ABNORMAL HIGH (ref 70–99)
Potassium: 3.5 mmol/L (ref 3.5–5.1)
Sodium: 138 mmol/L (ref 135–145)

## 2020-02-10 LAB — GLUCOSE, CAPILLARY
Glucose-Capillary: 158 mg/dL — ABNORMAL HIGH (ref 70–99)
Glucose-Capillary: 157 mg/dL — ABNORMAL HIGH (ref 70–99)
Glucose-Capillary: 117 mg/dL — ABNORMAL HIGH (ref 70–99)
Glucose-Capillary: 188 mg/dL — ABNORMAL HIGH (ref 70–99)
Glucose-Capillary: 110 mg/dL — ABNORMAL HIGH (ref 70–99)

## 2020-02-10 LAB — LIPID PANEL
Cholesterol: 147 mg/dL (ref 0–200)
HDL: 52 mg/dL (ref >40)
LDL Cholesterol: 75 mg/dL (ref 0–99)
Total CHOL/HDL Ratio: 2.8 RATIO
Triglycerides: 101 mg/dL (ref <150)
VLDL: 20 mg/dL (ref 0–40)

## 2020-02-10 LAB — CBC WITH DIFFERENTIAL/PLATELET
Abs Immature Granulocytes: 0.01 10*3/uL (ref 0.00–0.07)
Basophils Absolute: 0 10*3/uL (ref 0.0–0.1)
Basophils Relative: 1 %
Eosinophils Absolute: 0.2 10*3/uL (ref 0.0–0.5)
Eosinophils Relative: 3 %
HCT: 32.9 % — ABNORMAL LOW (ref 36.0–46.0)
Hemoglobin: 9.8 g/dL — ABNORMAL LOW (ref 12.0–15.0)
Immature Granulocytes: 0 %
Lymphocytes Relative: 44 %
Lymphs Abs: 2.3 10*3/uL (ref 0.7–4.0)
MCH: 25.4 pg — ABNORMAL LOW (ref 26.0–34.0)
MCHC: 29.8 g/dL — ABNORMAL LOW (ref 30.0–36.0)
MCV: 85.2 fL (ref 80.0–100.0)
Monocytes Absolute: 0.3 10*3/uL (ref 0.1–1.0)
Monocytes Relative: 6 %
Neutro Abs: 2.5 10*3/uL (ref 1.7–7.7)
Neutrophils Relative %: 46 %
Platelets: 248 10*3/uL (ref 150–400)
RBC: 3.86 MIL/uL — ABNORMAL LOW (ref 3.87–5.11)
RDW: 20 % — ABNORMAL HIGH (ref 11.5–15.5)
WBC: 5.3 10*3/uL (ref 4.0–10.5)
nRBC: 0 % (ref 0.0–0.2)

## 2020-02-10 LAB — HEMOGLOBIN A1C
Hgb A1c MFr Bld: 6.2 % — ABNORMAL HIGH (ref 4.8–5.6)
Mean Plasma Glucose: 131.24 mg/dL

## 2020-02-10 MED ORDER — BISACODYL 5 MG PO TBEC: 5.0000 mg | DELAYED_RELEASE_TABLET | Freq: Every day | ORAL | Status: DC | PRN

## 2020-02-10 MED ORDER — POLYETHYLENE GLYCOL 3350 17 G PO PACK: 17.0000 g | PACK | Freq: Every day | ORAL | Status: DC | PRN

## 2020-02-10 MED ORDER — ASPIRIN EC 81 MG PO TBEC
81.0000 mg | DELAYED_RELEASE_TABLET | Freq: Every day | ORAL | Status: DC
  Administered 2020-02-10 – 2020-02-14 (×5): 81 mg via ORAL
  Filled 2020-02-10 (×5): qty 1

## 2020-02-10 MED ORDER — CLOPIDOGREL BISULFATE 75 MG PO TABS
75.0000 mg | ORAL_TABLET | Freq: Every day | ORAL | Status: DC
  Administered 2020-02-10 – 2020-02-14 (×5): 75 mg via ORAL
  Filled 2020-02-10 (×5): qty 1

## 2020-02-10 NOTE — Plan of Care (Signed)
  Problem: Education: Goal: Knowledge of secondary prevention will improve Outcome: Progressing Goal: Knowledge of patient specific risk factors addressed and post discharge goals established will improve Outcome: Progressing   

## 2020-02-10 NOTE — Progress Notes (Signed)
CSW asked to contact patient's aunt in [place], Victorino Dike [phone]). Patient provided permission to contact her. Per MD, wants to move patient to SNF in [place]. CSW attempted to contact her to discuss, left her a voicemail and awaiting call back.   Blenda Nicely, [place] Clinical Social Worker [phone]

## 2020-02-10 NOTE — Evaluation (Signed)
Physical Therapy Evaluation Patient Details Name: Kaitlyn Gray MRN: [id number] DOB: [birth year] Today's Date: 02/10/2020   History of Present Illness  Kaitlyn Gray is a 44 y.o. female with history of stroke back in July 2021, hypertension, diabetes and bipolar 1 disorder.  Patient presented to the ED with left-sided weakness and facial numbness, onset 12 hours prior to arrival. MRI:MRI did show an acute right pontine infarct and evolving right MCP stroke.    Clinical Impression  Patient is received in bed lying on side. Restless and impulsive with mobility. Boyfriend, Kaitlyn Gray present in room for session. Patient states she wants to get up. Patient requires min assist for supine to sit to raise trunk to seated position. Flaccid left arm and leg. Reports numbness on entire left side. Patient requires mod +2 assist for sit to stand from bed. Fair standing balance with mod assist, no left knee buckling with weight bearing. She is unable to move left LE when in standing therefore she was assisted to recliner with +2 mod assist via pivot transfer to her right side.  Patient will continue to benefit from skilled PT while here to improve functional mobility, strength and independence.         Follow Up Recommendations SNF;Supervision/Assistance - 24 hour    Equipment Recommendations  Other (comment) (TBD)    Recommendations for Other Services       Precautions / Restrictions Precautions Precautions: Fall Restrictions Weight Bearing Restrictions: No      Mobility  Bed Mobility Overal bed mobility: Needs Assistance Bed Mobility: Supine to Sit Rolling: Min assist   Supine to sit: Min assist     General bed mobility comments: min assist to raise trunk and guarding for safety at edge of bed.  Transfers Overall transfer level: Needs assistance Equipment used: 2 person hand held assist Transfers: Sit to/from [employer] Sit to Stand: +2 physical assistance;Mod  assist;+2 safety/equipment Stand pivot transfers: Mod assist;+2 physical assistance;+2 safety/equipment       General transfer comment: Patient able to static stand with mod assist +2, no left knee buckling noted, but patient unable to move L LE to take steps  Ambulation/Gait             General Gait Details: unable to take a step with L LE  Stairs            Wheelchair Mobility    Modified Rankin (Stroke Patients Only) Modified Rankin (Stroke Patients Only) Pre-Morbid Rankin Score: Moderately severe disability Modified Rankin: Moderately severe disability     Balance Overall balance assessment: Needs assistance Sitting-balance support: Feet supported Sitting balance-Leahy Scale: Fair Sitting balance - Comments: requires guarding for safety   Standing balance support: Bilateral upper extremity supported;During functional activity Standing balance-Leahy Scale: Poor Standing balance comment: requires mod assist for balance in standing                             Pertinent Vitals/Pain Pain Assessment: Faces Faces Pain Scale: Hurts a little bit Pain Location: mouth--reports she bit her lip/cheek Pain Descriptors / Indicators: Sore    Home Living Family/patient expects to be discharged to:: Private residence Living Arrangements: Alone Available Help at Discharge: Friend(s);Available PRN/intermittently Type of Home: Apartment Home Access: Stairs to enter Entrance Stairs-Rails: Left Entrance Stairs-Number of Steps: flight Home Layout: One level Home Equipment: None      Prior Function Level of Independence: Needs assistance   Gait /  Transfers Assistance Needed: patient's boyfriend "Kaitlyn Gray" is with her most of the time, but not all of the time. He was assisting her to walk and reports she has had falls.  ADL's / Homemaking Assistance Needed: needs assistance  Comments: Has had multiple falls at home     Hand Dominance   Dominant Hand:  Right    Extremity/Trunk Assessment   Upper Extremity Assessment Upper Extremity Assessment: LUE deficits/detail;RUE deficits/detail RUE Deficits / Details: RUE weakness at baseline from old CVA LUE Deficits / Details: When asked to squeeze hand, raise arm, push arm down from flexed shoulder position (no movement seen other than trunk movement to try and lower arm); however when she sat down in recliner and we asked her to scoot back she automatically raised LUE with increased effort and time to place on arm rest of chair and she raised it again when I asked her to raise it so I could place a pillow under it . LUE Coordination: decreased fine motor;decreased gross motor    Lower Extremity Assessment Lower Extremity Assessment: LLE deficits/detail;RLE deficits/detail RLE Deficits / Details: RLE strength appears normal this visit. LLE Deficits / Details: 0/5 strength in L LE at this time LLE Sensation: decreased proprioception;decreased light touch LLE Coordination: decreased gross motor    Cervical / Trunk Assessment Cervical / Trunk Assessment: Normal  Communication   Communication: Expressive difficulties  Cognition Arousal/Alertness: Awake/alert Behavior During Therapy: WFL for tasks assessed/performed Overall Cognitive Status: History of cognitive impairments - at baseline Area of Impairment: Safety/judgement;Following commands;Memory;Attention;Awareness;Problem solving                   Current Attention Level: Sustained Memory: Decreased recall of precautions;Decreased short-term memory Following Commands: Follows one step commands with increased time Safety/Judgement: Decreased awareness of safety Awareness: Intellectual Problem Solving: Difficulty sequencing;Requires verbal cues;Requires tactile cues General Comments: Cognitive deficits at baseline. Required safety cues throughout.       General Comments      Exercises     Assessment/Plan    PT Assessment  Patient needs continued PT services  PT Problem List Decreased strength;Decreased balance;Decreased mobility;Decreased cognition;Decreased coordination;Decreased knowledge of use of DME;Decreased safety awareness;Decreased knowledge of precautions       PT Treatment Interventions Gait training;DME instruction;Functional mobility training;Therapeutic exercise;Therapeutic activities;Balance training;Cognitive remediation;Neuromuscular re-education;Patient/family education    PT Goals (Current goals can be found in the Care Plan section)  Acute Rehab PT Goals Patient Stated Goal: boyfirend understands need for Rehab PT Goal Formulation: Patient unable to participate in goal setting Time For Goal Achievement: 02/23/20 Potential to Achieve Goals: Fair    Frequency Min 3X/week   Barriers to discharge Inaccessible home environment;Decreased caregiver support lives alone    Co-evaluation PT/OT/SLP Co-Evaluation/Treatment: Yes Reason for Co-Treatment: Necessary to address cognition/behavior during functional activity;For patient/therapist safety PT goals addressed during session: Mobility/safety with mobility;Balance OT goals addressed during session: ADL's and self-care;Strengthening/ROM       AM-PAC PT "6 Clicks" Mobility  Outcome Measure   Help needed moving from lying on your back to sitting on the side of a flat bed without using bedrails?: A Little Help needed moving to and from a bed to a chair (including a wheelchair)?: A Lot Help needed standing up from a chair using your arms (e.g., wheelchair or bedside chair)?: A Lot Help needed to walk in hospital room?: Total Help needed climbing 3-5 steps with a railing? : Total 6 Click Score: 9    End of Session Equipment Utilized  During Treatment: Gait belt Activity Tolerance: Patient tolerated treatment well Patient left: in chair;with chair alarm set;with family/visitor present Nurse Communication: Mobility status PT Visit  Diagnosis: Other abnormalities of gait and mobility (R26.89);History of falling (Z91.81);Repeated falls (R29.6);Muscle weakness (generalized) (M62.81);Hemiplegia and hemiparesis Hemiplegia - Right/Left: Left Hemiplegia - dominant/non-dominant: Non-dominant Hemiplegia - caused by: Cerebral infarction    Time: 1000-1013 PT Time Calculation (min) (ACUTE ONLY): 13 min   Charges:   PT Evaluation $PT Eval Moderate Complexity: 1 Mod          Kaitlyn Gray, PT, GCS 02/10/20,10:51 AM

## 2020-02-10 NOTE — TOC CAGE-AID Note (Signed)
Transition of Care [facility]) - CAGE-AID Screening   Patient Details  Name: Kaitlyn Gray MRN: [id number] Date of Birth: 07/17/1975  [facility]) CM/SW Contact:    Emeterio Reeve, [place] Phone Number: 02/10/2020, 4:02 PM   Clinical Narrative:  CSW met with pt at bedside. CSW introduced self and explained her role at the hospital. Pts significant other was present.  Pt denies alcohol use. Pt denies substance use. CSW informed pt she was positive for cocaine at admission . Pt responded by saying she doesn't get high anymore. Pt denied resources.   Pts significant other was in the room and stated pt lives in [place] and needs a first floor apartment. CSW gave him [place] housing authority's phone number and explained they they would have to call them for information.   CAGE-AID Screening:    Have You Ever Felt You Ought to Cut Down on Your Drinking or Drug Use?: No Have People Annoyed You By Critizing Your Drinking Or Drug Use?: No Have You Felt Bad Or Guilty About Your Drinking Or Drug Use?: No Have You Ever Had a Drink or Used Drugs First Thing In The Morning to Steady Your Nerves or to Get Rid of a Hangover?: No CAGE-AID Score: 0  Substance Abuse Education Offered: Yes (pt denied)    [place] Clinical Social Worker [phone]

## 2020-02-10 NOTE — Progress Notes (Signed)
STROKE TEAM PROGRESS NOTE   INTERVAL HISTORY Husband at bedside sleeping in chair. Pt is sleepy drowsy and not cooperative on exam. On observation, pt still has dense left hemiplegia, left facial droop and dysarthria.   Vitals:   02/09/20 2002 02/09/20 2353 02/10/20 0349 02/10/20 0804  BP: (!) 156/98 (!) 144/91 (!) 149/93 (!) 171/87  Pulse: 63 66 69 61  Resp: 18 18 18 18   Temp: 97.9 F (36.6 C) 98 F (36.7 C) 98.3 F (36.8 C) 97.6 F (36.4 C)  TempSrc: Oral Oral Oral Oral  SpO2: 99% 98% 99% 98%  Height:       CBC:  Recent Labs  Lab 02/09/20 0017 02/09/20 0029 02/09/20 1806 02/10/20 0328  WBC 4.9   < > 4.8 5.3  NEUTROABS 2.9  --   --  2.5  HGB 10.6*   < > 10.2* 9.8*  HCT 35.2*   < > 34.0* 32.9*  MCV 85.2   < > 85.6 85.2  PLT 302   < > 276 248   < > = values in this interval not displayed.   Basic Metabolic Panel:  Recent Labs  Lab 02/09/20 0017 02/09/20 0017 02/09/20 0029 02/09/20 0029 02/09/20 1806 02/10/20 0328  NA 139   < > 143  --   --  138  K 3.7   < > 3.5  --   --  3.5  CL 106   < > 106  --   --  108  CO2 23  --   --   --   --  22  GLUCOSE 159*   < > 154*  --   --  132*  BUN 10   < > 11  --   --  11  CREATININE 1.11*   < > 1.10*   < > 0.91 0.96  CALCIUM 9.0  --   --   --   --  8.6*   < > = values in this interval not displayed.   Lipid Panel:  Recent Labs  Lab 02/10/20 0328  CHOL 147  TRIG 101  HDL 52  CHOLHDL 2.8  VLDL 20  LDLCALC 75   HgbA1c:  Recent Labs  Lab 02/10/20 0328  HGBA1C 6.2*   Urine Drug Screen:  Recent Labs  Lab 02/09/20 1438  LABOPIA NONE DETECTED  COCAINSCRNUR POSITIVE*  LABBENZ NONE DETECTED  AMPHETMU NONE DETECTED  THCU POSITIVE*  LABBARB NONE DETECTED    Alcohol Level  Recent Labs  Lab 02/09/20 1806  ETH <10    IMAGING past 24 hours MR Brain W and Wo Contrast  Result Date: 02/09/2020 CLINICAL DATA:  Left-sided numbness EXAM: MRI HEAD WITHOUT AND WITH CONTRAST TECHNIQUE: Multiplanar, multiecho pulse  sequences of the brain and surrounding structures were obtained without and with intravenous contrast. CONTRAST:  47mL GADAVIST GADOBUTROL 1 MMOL/ML IV SOLN COMPARISON:  01/02/2020 FINDINGS: Brain: There is new restricted diffusion at the right aspect of the pons. No associated enhancement. There has been evolution of the abnormal diffusion and T2 hyperintensity and resolution of enhancement centered within the right middle cerebellar peduncle. Foci of susceptibility are again identified within the thalamus bilaterally likely reflecting chronic microhemorrhages. A few other punctate foci are present in the cerebral white matter and cerebellum. Patchy and confluent areas of T2 hyperintensity in the supratentorial and pontine white matter are nonspecific but may reflect moderate chronic microvascular ischemic changes. Ventricles are stable in size. No intracranial mass or significant mass effect. Vascular: Major vessel  flow voids at the skull base are preserved. Skull and upper cervical spine: Normal marrow signal is preserved. Sinuses/Orbits: Mild mucosal thickening. Chronic deformity of the right medial orbital wall. Other: Sella is unremarkable.  Mastoid air cells are clear. IMPRESSION: Acute right pontine infarct. Evolution of previous abnormality centered within the right middle cerebellar peduncle, which was likely also ischemic in etiology. Stable additional findings detailed above. Electronically Signed   By: Guadlupe Spanish M.D.   On: 02/09/2020 15:39    PHYSICAL EXAM  Temp:  [97.6 F (36.4 C)-98.3 F (36.8 C)] (P) 98 F (36.7 C) (08/27 1155) Pulse Rate:  [54-69] (P) 63 (08/27 1155) Resp:  [18-22] (P) 18 (08/27 1155) BP: (144-176)/(74-100) (P) 182/98 (08/27 1155) SpO2:  [98 %-100 %] (P) 100 % (08/27 1155)  General - Well nourished, well developed, drowsy sleepy.  Ophthalmologic - fundi not visualized due to noncooperation.  Cardiovascular - Regular rhythm and rate.  Neuro - drowsy sleepy not  cooperative on exam. Eyes closed but briefly open on voice, moderate to severe dysarthria, able to tell me in hospital but not answering other orientation questions. Very limited language output, not cooperative on naming or repetition, did not follow simple commands. On forced eye opening, eyes mid position, not blinking to visual threat bilaterally, left facial droop, tongue protrusion not cooperative. Spontaneous movement on the right UE and LE to positioning herself, but no movement of LUE and LLE. Sensation, coordination not cooperative and gait not tested.   ASSESSMENT/PLAN Ms. CHYANN AMBROCIO is a 44 y.o. female with history of hypertension, diabetes and bipolar 1 disorder, admitted 12/2019 for likely stroke with residual right-sided weakness but left AMA, now presenting with 12hrs of left-sided weakness and facial numbness.   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 Atrophy. Old R occipital infarct   CT head 8/26 developing R MCA peduncle low attenuation w/ evolving infarct vs demyelination. No acute infarct.   MRI  Acute R pontine infarct. Evolution of R middl cerebellar peduncle abnormality, likely ischemic.  Carotid Doppler 7/19 B ICA 1-39% stenosis, VAs antegrade   2D Echo 7/19 EF 55-60%. No source of embolus   LDL 75  HgbA1c 6.2  VTE prophylaxis - Lovenox 40 mg sq daily   No antithrombotic prior to admission, now on aspirin 81 mg daily and clopidogrel 75 mg daily. Continue DAPT x 3 weeks then aspirin alone  Therapy recommendations:  SNF  Disposition:  pending (used walker at baseline PTA)  Hx stroke and pseudoseizure  12/2019 -admitted for right-sided weakness, numbness and slurred speech.  MRI showed acute/subacute demyelinationvs Subacute infarction R middle cerebral peduncle.  MRA showed Multifocal intracranial atheroscleroticdisease  vsvasculitisvs cocaine induced vasculopathy, including left ICA siphon moderate stenosis, left M2, right P2 and left P4 moderate to severe stenosis.  Carotid Doppler negative.  EF 55 to 60%.  LP w/ slightly elevated protein 69, WBC 4, negative oligoclonol bands.  UDS positive for cocaine.  A1c 7.5, LDL 122.  ANA positive.  Patient left AMA at that time.  Patient diagnosis most likely due to ischemic infarction.  02/01/2020 ED visit for unresponsiveness.  However exam concerning for psychogenic source.  EEG negative.  Discharged from ED.  Hypertension  Stable . Permissive hypertension (OK if < 220/120) but gradually normalize in 5-7 days . Long-term BP goal normotensive  Hyperlipidemia  Home meds:  No statin  now on lipitor 80  LDL 75, goal < 70  Continue statin at discharge  Diabetes type II Controlled  Home meds:  Metformin, insulin  HgbA1c 6.2, goal < 7.0  CBGs  SSI  PCP follow-up  Cocaine abuse  Cocaine positive in all recent admission and ED visits  Caucasian education need to be provided this admission again  Tobacco abuse  Current smoker  Smoking cessation counseling provided  Other Stroke Risk Factors  THC abuse. Patient advised to stop using due to stroke risk.  Obesity, Body mass index is 32.79 kg/m., recommend weight loss, diet and exercise as appropriate   Other Active Problems  Bipolar d/o 1  Hospital day # 1  Neurology will sign off. Please call with questions. Pt will follow up with stroke clinic NP at [facility] in about 4 weeks. Thanks for the consult.  Marvel Plan, MD PhD Stroke Neurology 02/10/2020 4:07 PM   To contact Stroke Continuity provider, please refer to [email or web address]. After hours, contact General Neurology

## 2020-02-10 NOTE — Progress Notes (Signed)
PROGRESS NOTE    Kaitlyn Gray  NWG:[id number] DOB: 1975-12-23 DOA: 02/08/2020 PCP: Patient, No Pcp Per   Brief Narrative: HPI per Dr. Ardean Larsen on 02/09/20 Kaitlyn Gray is a 44 y.o. 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  She is not really good historian.  She answered yes to almost all of my questions including headache, blurry vision, lightheadedness, dizziness, chest pain, shortness of breath, palpitation, nausea, vomiting, abdominal pain.  She denies fever, chills, cough, congestion.  She tells me that she received 1 dose of COVID-19 vaccine.  Of note: Patient admitted in July 2021 with ischemic stroke however she left AMA.  EEG was done which came back negative for acute findings, transthoracic echo showed ejection fraction of 55 to 60%, no PFO.  She is noncompliant with her medications.   She continues to smoke 1 pack of cigarettes per day, uses marijuana and cocaine however denies alcohol use.  She uses walker for ambulation.  ED Course: Upon arrival to ED: Patient's blood pressure was noted to be elevated, afebrile with no leukocytosis, CBC shows normocytic anemia, UDS positive for cocaine and marijuana, ethanol: Pending, COVID-19: Pending.  CT head shows developing low-attenuation with in right middle cerebral peduncle in keeping with an evolving infarct or focus of demyelination.  No hemorrhage.  MRI brain shows acute right pontine infarct.  EDP consulted neurology.  [facility] consulted for admission for ischemic stroke.  **Interim History  Neurology evaluated and she is not very cooperative with the exam.  Continues to have a  paraplegia left facial droop and dysarthria as well as left leg weakness.  Neurology recommending no further work-up given that she had most of her work-up done last week and recommending aspirin 81 mg p.o. daily as well as clopidogrel 75 mg p.o. daily and continue dual antiplatelet therapy for 3 weeks and then just aspirin alone.  PT OT recommending SNF.  I have notified social work team about assistance with placement.  Assessment & Plan:   Principal Problem:   Ischemic stroke (HCC) Active Problems:   Cocaine use disorder, severe, dependence (HCC)   Cannabis use disorder, moderate, dependence (HCC)   Diabetes mellitus without complication (HCC)   Hypertension   Normocytic anemia  Ischemic Stroke: Right Pontine Infarct -Risk factors including hypertension, diabetes, previous history of stroke, noncompliance with medications.  Polysubstance abuse with THC and Cocaine.   -Reviewed CT head and MRI brain. -CT head showed "Developing low-attenuation within the right middle cerebral peduncle in keeping with an evolving infarct or focus of demyelination better seen on MRI of 01/01/2020. No evidence of acute intracranial hemorrhage or infarct. Moderate periventricular white matter changes are present more focal in the region of the atria of the lateral ventricles bilaterally, possibly the result of small vessel ischemia." -MRI Brain Showed "Acute right pontine infarct. Evolution of previous abnormality centered within the right middle cerebellar peduncle, which was likely also ischemic in etiology. Stable additional findings detailed above." -She had most of the work-up within the last month and had carotid Dopplers and 2D echo on 01/02/2020 -Continued Telemetry monitoring -Stroke protocol initiated  -Allow for permissive hypertension for the first 24-48h - only treat PRN if SBP >220 mmHg or diastolic blood pressure >120. Blood pressures can be gradually  normalized to SBP<140 upon discharge. -Start on  aspirin and high intensity statin; Neurology added Clopidogrel -Neurology recommending dual antiplatelet therapy for 3 weeks and then just aspirin alone -Frequent neuro checks -Checked A1c and was 6.2,  -Lipid panel done and showed total cholesterol/HDL ratio of 2.8, cholesterol level 147, HDL of 52, LDL of 75, triglycerides of 101, VLDL of 20 -Risk factor modification -PT/OT eval, Speech consult; PT OT recommending skilled nursing facility  Hypertension -Blood pressure was elevated upon arrival and still remains somewhat elevated and blood pressure 171/87 but we are allowing for permissive hypertension and gradually normalizing in 5 to 7 days -Hold home BP meds lisinopril to allow permissive hypertension.   -Avoid beta-blockers in the setting of cocaine use -Monitor blood pressure closely and treat only if greater than 220/120 for now with long-term blood pressure goal being normotensive  Hyperlipidemia -Lipid panel as above -Started on atorvastatin 80 mg daily given her acute stroke  Type 2 Diabetes Mellitus -Noncompliant with home medication.   -Hold Metformin.   -Started patient on sliding scale insulin.  Last A1c 7.5% and repeat this visit is 6.2 -CBGs ranging from 97-158  Normocytic Anemia -Patient's hemoglobin/hematocrit went from 10.2/34.0 is now 9.8/32.9 -Check anemia panel in the a.m.  -Continue to monitor for signs and symptoms of bleeding; currently no overt bleeding noted  -Repeat CBC in a.m.  Polysubstance Abuse -UDS is positive for marijuana and cocaine and she has been positive in recent admission and ED visits -Counseled about cessation. -Teaching laboratory technician for [employer]  Tobacco Abuse -Counseled about cessation however she is not ready to quit yet.  Obesity -Estimated body mass index is 32.79 kg/m as calculated from the following:   Height as of this encounter:  (1.626 m).   Weight as of 01/20/20: 86.6 kg. -Diet modification/exercise/weight  loss recommended  Bipolar Disorder -Reviewed home meds.  Does not take any medications for bipolar at home. -Outpatient psych follow-up recommended.  DVT prophylaxis: Enoxaparin 40 mg subcu every 24 Code Status: FULL CODE  Family Communication: Significant other was at bedside and updated on via the telephone Disposition Plan: SNF  Status is: Inpatient  Remains inpatient appropriate because:Inpatient level of care appropriate due to severity of illness   Dispo: The patient is from: Home              Anticipated d/c is to: SNF              Anticipated d/c date is: 1 day              Patient currently is medically stable to d/c.  Consultants:   Neurology   Procedures: Head CT and MRI  Antimicrobials:  Anti-infectives (From admission, onward)   None     Subjective: Seen and examined at bedside is resting and wanting to sleep and be left alone.  Complaining of pain in her teeth.  Difficult to understand and states that she cannot move her left arm at all.  No chest pain, lightheadedness or dizziness.  No other concerns or complaint at this time.  Objective: Vitals:   02/09/20 1840 02/09/20 2002 02/09/20 2353 02/10/20 0349  BP: (!) 170/74 (!) 156/98 (!) 144/91 (!) 149/93  Pulse: (!) 54 63 66 69  Resp: Temp: 98.3 F (36.8 C) 97.9 F (36.6 C) 98 F (36.7 C) 98.3 F (36.8 C)  TempSrc: Oral Oral Oral Oral  SpO2: 100% 99% 98% 99%  Height:  Intake/Output Summary (Last 24 hours) at 02/10/2020 0802 Last data filed at 02/09/2020 2130 Gross per 24 hour  Intake 240 ml  Output --  Net 240 ml   There were no vitals filed for this visit.  Examination: Physical Exam:  Constitutional: WN/WD obese African-American female currently in no real acute distress appears calm and resting Eyes: Lids and conjunctivae normal, sclerae anicteric  ENMT: External Ears, Nose appear normal. Grossly normal hearing.  Neck: Appears normal, supple, no cervical masses,  normal ROM, no appreciable thyromegaly; no JVD Respiratory: Diminished to auscultation bilaterally, no wheezing, rales, rhonchi or crackles. Normal respiratory effort and patient is not tachypenic. No accessory muscle use.  Unlabored breathing Cardiovascular: RRR, no murmurs / rubs / gallops. S1 and S2 auscultated.  Mild lower extremity edema Abdomen: Soft, non-tender, distended secondary by habitus. Bowel sounds positive.  GU: Deferred. Musculoskeletal: No clubbing / cyanosis of digits/nails. No joint deformity upper and lower extremities.  Skin: No rashes, lesions, ulcers on a limited skin evaluation. No induration; Warm and dry.  Neurologic: CN 2-12 grossly intact with no focal deficits. Romberg sign and cerebellar reflexes not assessed.  Psychiatric: Normal judgment and insight. Alert and oriented x 3. Normal mood and appropriate affect.   Data Reviewed: I have personally reviewed following labs and imaging studies  CBC: Recent Labs  Lab 02/09/20 0017 02/09/20 0029 02/09/20 1806 02/10/20 0328  WBC 4.9  --  4.8 5.3  NEUTROABS 2.9  --   --  2.5  HGB 10.6* 11.6* 10.2* 9.8*  HCT 35.2* 34.0* 34.0* 32.9*  MCV 85.2  --  85.6 85.2  PLT 302  --  276 248   Basic Metabolic Panel: Recent Labs  Lab 02/09/20 0017 02/09/20 0029 02/09/20 1806 02/10/20 0328  NA 139 143  --  138  K 3.7 3.5  --  3.5  CL 106 106  --  108  CO2 23  --   --  22  GLUCOSE 159* 154*  --  132*  BUN 10 11  --  11  CREATININE 1.11* 1.10* 0.91 0.96  CALCIUM 9.0  --   --  8.6*   GFR: CrCl cannot be calculated (Unknown ideal weight.). Liver Function Tests: Recent Labs  Lab 02/09/20 0017  AST 15  ALT 13  ALKPHOS 65  BILITOT 0.2*  PROT 6.8  ALBUMIN 3.5   No results for input(s): LIPASE, AMYLASE in the last 168 hours. No results for input(s): AMMONIA in the last 168 hours. Coagulation Profile: Recent Labs  Lab 02/09/20 0017  INR 1.0   Cardiac Enzymes: No results for input(s): CKTOTAL, CKMB,  CKMBINDEX, TROPONINI in the last 168 hours. BNP (last 3 results) No results for input(s): PROBNP in the last 8760 hours. HbA1C: Recent Labs    02/10/20 0328  HGBA1C 6.2*   CBG: Recent Labs  Lab 02/09/20 1239 02/09/20 1801 02/09/20 2118 02/10/20 0607  GLUCAP 142* 97 145* 158*   Lipid Profile: Recent Labs    02/10/20 0328  CHOL 147  HDL 52  LDLCALC 75  TRIG 101  CHOLHDL 2.8   Thyroid Function Tests: No results for input(s): TSH, T4TOTAL, FREET4, T3FREE, THYROIDAB in the last 72 hours. Anemia Panel: No results for input(s): VITAMINB12, FOLATE, FERRITIN, TIBC, IRON, RETICCTPCT in the last 72 hours. Sepsis Labs: No results for input(s): PROCALCITON, LATICACIDVEN in the last 168 hours.  Recent Results (from the past 240 hour(s))  Culture, blood (routine x 2)     Status: None   Collection  Time: 02/01/20  5:55 PM   Specimen: BLOOD  Result Value Ref Range Status   Specimen Description BLOOD SITE NOT SPECIFIED  Final   Special Requests   Final    BOTTLES DRAWN AEROBIC AND ANAEROBIC Blood Culture results may not be optimal due to an excessive volume of blood received in culture bottles   Culture   Final    NO GROWTH 5 DAYS Performed at [facility], [address] [place], [place] [address]    Report Status 02/06/2020 FINAL  Final  SARS Coronavirus 2 by RT PCR (hospital order, performed in [facility] hospital lab) Nasopharyngeal Nasopharyngeal Swab     Status: None   Collection Time: 02/09/20  3:47 PM   Specimen: Nasopharyngeal Swab  Result Value Ref Range Status   SARS Coronavirus 2 NEGATIVE NEGATIVE Final    Comment: (NOTE) SARS-CoV-2 target nucleic acids are NOT DETECTED.  The SARS-CoV-2 RNA is generally detectable in upper and lower respiratory specimens during the acute phase of infection. The lowest concentration of SARS-CoV-2 viral copies this assay can detect is 250 copies / mL. A negative result does not preclude SARS-CoV-2 infection and should not be  used as the sole basis for treatment or other patient management decisions.  A negative result may occur with improper specimen collection / handling, submission of specimen other than nasopharyngeal swab, presence of viral mutation(s) within the areas targeted by this assay, and inadequate number of viral copies (<250 copies / mL). A negative result must be combined with clinical observations, patient history, and epidemiological information.  Fact Sheet for Patients:   BoilerBrush.com.cy  Fact Sheet for Healthcare Providers: https://pope.com/  This test is not yet approved or  cleared by the [place] FDA and has been authorized for detection and/or diagnosis of SARS-CoV-2 by FDA under an Emergency Use Authorization (EUA).  This EUA will remain in effect (meaning this test can be used) for the duration of the COVID-19 declaration under Section 564(b)(1) of the Act, 21 U.S.C. section 360bbb-3(b)(1), unless the authorization is terminated or revoked sooner.  Performed at [facility], [address], [place], [place] [address]      RN Pressure Injury Documentation:     Estimated body mass index is 32.79 kg/m as calculated from the following:   Height as of this encounter: 5\' 4"  (1.626 m).   Weight as of 01/20/20: 86.6 kg.  Malnutrition Type:      Malnutrition Characteristics:      Nutrition Interventions:     Radiology Studies: CT HEAD WO CONTRAST  Result Date: 02/09/2020 CLINICAL DATA:  Left-sided numbness, ataxia, dysarthria EXAM: CT HEAD WITHOUT CONTRAST TECHNIQUE: Contiguous axial images were obtained from the base of the skull through the vertex without intravenous contrast. COMPARISON:  02/01/2020 FINDINGS: Brain: There is developing low-attenuation within the right middle cerebral peduncle in keeping with an involving infarct or focus of demyelination better seen on MRI of 01/01/2020. Remote right  cortical infarct again noted. Lacunar infarct within the right basal ganglia again noted. Moderate periventricular white matter changes are present more focal in the region of the atria of the lateral ventricles bilaterally, possibly the result of small vessel ischemia. No abnormal intra or extra-axial mass lesion or fluid collection. No abnormal mass effect or midline shift. No evidence of acute intracranial hemorrhage or infarct. Ventricular size is normal. Cerebellum unremarkable. Vascular: Unremarkable Skull: Intact Sinuses/Orbits: Paranasal sinuses are clear. Orbits are unremarkable. Other: Mastoid air cells and middle ear cavities  are clear. IMPRESSION: 1. Developing low-attenuation within the right middle cerebral peduncle in keeping with an evolving infarct or focus of demyelination better seen on MRI of 01/01/2020. 2. No evidence of acute intracranial hemorrhage or infarct. 3. Moderate periventricular white matter changes are present more focal in the region of the atria of the lateral ventricles bilaterally, possibly the result of small vessel ischemia. Electronically Signed   By: Helyn Numbers MD   On: 02/09/2020 01:17   MR Brain W and Wo Contrast  Result Date: 02/09/2020 CLINICAL DATA:  Left-sided numbness EXAM: MRI HEAD WITHOUT AND WITH CONTRAST TECHNIQUE: Multiplanar, multiecho pulse sequences of the brain and surrounding structures were obtained without and with intravenous contrast. CONTRAST:  22mL GADAVIST GADOBUTROL 1 MMOL/ML IV SOLN COMPARISON:  01/02/2020 FINDINGS: Brain: There is new restricted diffusion at the right aspect of the pons. No associated enhancement. There has been evolution of the abnormal diffusion and T2 hyperintensity and resolution of enhancement centered within the right middle cerebellar peduncle. Foci of susceptibility are again identified within the thalamus bilaterally likely reflecting chronic microhemorrhages. A few other punctate foci are present in the cerebral  white matter and cerebellum. Patchy and confluent areas of T2 hyperintensity in the supratentorial and pontine white matter are nonspecific but may reflect moderate chronic microvascular ischemic changes. Ventricles are stable in size. No intracranial mass or significant mass effect. Vascular: Major vessel flow voids at the skull base are preserved. Skull and upper cervical spine: Normal marrow signal is preserved. Sinuses/Orbits: Mild mucosal thickening. Chronic deformity of the right medial orbital wall. Other: Sella is unremarkable.  Mastoid air cells are clear. IMPRESSION: Acute right pontine infarct. Evolution of previous abnormality centered within the right middle cerebellar peduncle, which was likely also ischemic in etiology. Stable additional findings detailed above. Electronically Signed   By: Guadlupe Spanish M.D.   On: 02/09/2020 15:39   Scheduled Meds: .  stroke: mapping our early stages of recovery book   Does not apply Once  . aspirin  300 mg Rectal Daily   Or  . aspirin  325 mg Oral Daily  . atorvastatin  80 mg Oral Daily  . enoxaparin (LOVENOX) injection  40 mg Subcutaneous Q24H  . insulin aspart  0-15 Units Subcutaneous TID WC  . insulin aspart  0-5 Units Subcutaneous QHS  . sodium chloride flush  3 mL Intravenous Once   Continuous Infusions:   LOS: 1 day   Merlene Laughter, DO [facility] PAGER is on AMION  If 7PM-7AM, please contact night-coverage [email or web address]

## 2020-02-10 NOTE — Evaluation (Signed)
Occupational Therapy Evaluation Patient Details Name: Kaitlyn Gray MRN: [id number] DOB: 1976/05/28 Today's Date: 02/10/2020    History of Present Illness Kaitlyn Gray is a 44 y.o. female with history of stroke back in July 2021, hypertension, diabetes and bipolar 1 disorder.  Patient presented to the ED with left-sided weakness and facial numbness, onset 12 hours prior to arrival. MRI:MRI did show an acute right pontine infarct and evolving right MCP stroke.   Clinical Impression   This 44 yo female admitted with above presents to acute OT with PLOF per boyfriend pretty "iffy" since recent previous stroke ("pt has fallen"). Currently pt is +2 Mod A sit<>stand and stand pivot and Mod-Max A for basic ADLs. She does have a boyfriend but he is not with her all the time. She will benefit from acute OT with follow up at SNF.    Follow Up Recommendations  SNF;Supervision/Assistance - 24 hour    Equipment Recommendations  Other (comment) (TBD next venue)       Precautions / Restrictions Precautions Precautions: Fall Restrictions Weight Bearing Restrictions: No      Mobility Bed Mobility Overal bed mobility: Needs Assistance Bed Mobility: Supine to Sit Rolling: Min assist   Supine to sit: Min assist     General bed mobility comments: min assist to raise trunk and guarding for safety at edge of bed.  Transfers Overall transfer level: Needs assistance Equipment used: 2 person hand held assist Transfers: Sit to/from [employer] Sit to Stand: +2 physical assistance;Mod assist;+2 safety/equipment Stand pivot transfers: Mod assist;+2 physical assistance;+2 safety/equipment       General transfer comment: Patient able to static stand with mod assist +2, no left knee buckling noted, but patient unable to move L LE to take steps    Balance Overall balance assessment: Needs assistance Sitting-balance support: Feet supported Sitting balance-Leahy Scale:  Fair Sitting balance - Comments: requires guarding for safety   Standing balance support: Bilateral upper extremity supported;During functional activity Standing balance-Leahy Scale: Poor Standing balance comment: requires mod assist for balance in standing                           ADL either performed or assessed with clinical judgement   ADL Overall ADL's : Needs assistance/impaired Eating/Feeding: Supervision/ safety;Set up;Sitting   Grooming: Moderate assistance;Sitting   Upper Body Bathing: Moderate assistance;Sitting   Lower Body Bathing: Maximal assistance Lower Body Bathing Details (indicate cue type and reason): Mod A +2 sit<>stand Upper Body Dressing : Maximal assistance;Sitting   Lower Body Dressing: Maximal assistance Lower Body Dressing Details (indicate cue type and reason): Mod A +2 sit<>stand Toilet Transfer: Moderate assistance;+2 for physical assistance;Stand-pivot Toilet Transfer Details (indicate cue type and reason): bed>recliner on her right (dragged her LLE) Toileting- Clothing Manipulation and Hygiene: Maximal assistance Toileting - Clothing Manipulation Details (indicate cue type and reason): Mod A +2 sit<>stand             Vision   Vision Assessment?: Vision impaired- to be further tested in functional context Additional Comments: Noted right sided gaze preference, left inattention with possible field cut            Pertinent Vitals/Pain Pain Assessment: Faces Faces Pain Scale: Hurts a little bit Pain Location: mouth--reports she bit her lip/cheek Pain Descriptors / Indicators: Sore     Hand Dominance Right   Extremity/Trunk Assessment Upper Extremity Assessment Upper Extremity Assessment: LUE deficits/detail;RUE deficits/detail RUE Deficits / Details:  RUE weakness at baseline from old CVA LUE Deficits / Details: When asked to squeeze hand, raise arm, push arm down from flexed shoulder position (no movement seen other than  trunk movement to try and lower arm); however when she sat down in recliner and we asked her to scoot back she automatically raised LUE with increased effort and time to place on arm rest of chair and she raised it again when I asked her to raise it so I could place a pillow under it . LUE Coordination: decreased fine motor;decreased gross motor   Lower Extremity Assessment Lower Extremity Assessment: LLE deficits/detail;RLE deficits/detail RLE Deficits / Details: RLE strength appears normal this visit. LLE Deficits / Details: 0/5 strength in L LE at this time LLE Sensation: decreased proprioception;decreased light touch LLE Coordination: decreased gross motor   Cervical / Trunk Assessment Cervical / Trunk Assessment: Normal   Communication Communication Communication: Expressive difficulties   Cognition Arousal/Alertness: Awake/alert Behavior During Therapy: WFL for tasks assessed/performed Overall Cognitive Status: History of cognitive impairments - at baseline Area of Impairment: Safety/judgement;Following commands;Memory;Attention;Awareness;Problem solving                   Current Attention Level: Sustained Memory: Decreased recall of precautions;Decreased short-term memory Following Commands: Follows one step commands with increased time Safety/Judgement: Decreased awareness of safety Awareness: Intellectual Problem Solving: Difficulty sequencing;Requires verbal cues;Requires tactile cues General Comments: Cognitive deficits at baseline. Required safety cues throughout.               Home Living Family/patient expects to be discharged to:: Private residence Living Arrangements: Alone Available Help at Discharge: Friend(s);Available PRN/intermittently Type of Home: Apartment Home Access: Stairs to enter Entrance Stairs-Number of Steps: flight Entrance Stairs-Rails: Left Home Layout: One level Alternate Level Stairs-Number of Steps: flight   Bathroom  Shower/Tub: Chief Strategy Officer: Standard     Home Equipment: None      Lives With: Alone    Prior Functioning/Environment Level of Independence: Needs assistance  Gait / Transfers Assistance Needed: patient's boyfriend "Alinda Money" is with her most of the time, but not all of the time. He was assisting her to walk and reports she has had falls. ADL's / Homemaking Assistance Needed: needs assistance Communication / Swallowing Assistance Needed: garbled speech Comments: Has had multiple falls at home        OT Problem List: Decreased strength;Decreased range of motion;Impaired balance (sitting and/or standing);Impaired vision/perception;Impaired UE functional use;Decreased safety awareness;Decreased cognition;Pain;Decreased coordination      OT Treatment/Interventions: Self-care/ADL training;Therapeutic exercise;Therapeutic activities;Neuromuscular education;Visual/perceptual remediation/compensation;DME and/or AE instruction;Patient/family education;Balance training    OT Goals(Current goals can be found in the care plan section) Acute Rehab OT Goals Patient Stated Goal: boyfirend understands need for Rehab OT Goal Formulation: With patient Time For Goal Achievement: 02/24/20 Potential to Achieve Goals: Good  OT Frequency: Min 2X/week   Barriers to D/C: Decreased caregiver support          Co-evaluation PT/OT/SLP Co-Evaluation/Treatment: Yes Reason for Co-Treatment: Necessary to address cognition/behavior during functional activity;For patient/therapist safety PT goals addressed during session: Mobility/safety with mobility;Balance OT goals addressed during session: ADL's and self-care;Strengthening/ROM      AM-PAC OT "6 Clicks" Daily Activity     Outcome Measure Help from another person eating meals?: A Little Help from another person taking care of personal grooming?: A Lot Help from another person toileting, which includes using toliet, bedpan, or urinal?: A  Lot Help from another person bathing (including washing, rinsing,  drying)?: A Lot Help from another person to put on and taking off regular upper body clothing?: A Lot Help from another person to put on and taking off regular lower body clothing?: A Lot 6 Click Score: 13   End of Session Equipment Utilized During Treatment: Gait belt Nurse Communication: Mobility status (in person and written on board)  Activity Tolerance: Patient tolerated treatment well Patient left: in chair;with call bell/phone within reach;with chair alarm set  OT Visit Diagnosis: Unsteadiness on feet (R26.81);Other abnormalities of gait and mobility (R26.89);Repeated falls (R29.6);History of falling (Z91.81);Muscle weakness (generalized) (M62.81);Low vision, both eyes (H54.2);Other symptoms and signs involving cognitive function;Pain;Hemiplegia and hemiparesis Hemiplegia - Right/Left: Left (but prior CVA recently that also affected right side) Hemiplegia - dominant/non-dominant: Non-Dominant Hemiplegia - caused by: Cerebral infarction Pain - part of body:  (mouth)                Time: 0102-7253 OT Time Calculation (min): 18 min Charges:  OT General Charges $OT Visit: 1 Visit OT Evaluation $OT Eval Moderate Complexity: 1 Mod  Ignacia Palma, OTR/L Acute [employer] Pager [phone] Office [phone]     Evette Georges 02/10/2020, 10:50 AM

## 2020-02-11 DIAGNOSIS — F17200 Nicotine dependence, unspecified, uncomplicated: Secondary | ICD-10-CM

## 2020-02-11 DIAGNOSIS — Z8669 Personal history of other diseases of the nervous system and sense organs: Secondary | ICD-10-CM

## 2020-02-11 LAB — PHOSPHORUS: Phosphorus: 3.3 mg/dL (ref 2.5–4.6)

## 2020-02-11 LAB — COMPREHENSIVE METABOLIC PANEL
ALT: 12 U/L (ref 0–44)
AST: 12 U/L — ABNORMAL LOW (ref 15–41)
Albumin: 2.9 g/dL — ABNORMAL LOW (ref 3.5–5.0)
Alkaline Phosphatase: 57 U/L (ref 38–126)
Anion gap: 7 (ref 5–15)
BUN: 12 mg/dL (ref 6–20)
CO2: 24 mmol/L (ref 22–32)
Calcium: 8.8 mg/dL — ABNORMAL LOW (ref 8.9–10.3)
Chloride: 106 mmol/L (ref 98–111)
Creatinine, Ser: 0.87 mg/dL (ref 0.44–1.00)
GFR calc Af Amer: >60 mL/min (ref >60)
GFR calc non Af Amer: >60 mL/min (ref >60)
Glucose, Bld: 135 mg/dL — ABNORMAL HIGH (ref 70–99)
Potassium: 3.5 mmol/L (ref 3.5–5.1)
Sodium: 137 mmol/L (ref 135–145)
Total Bilirubin: 0.3 mg/dL (ref 0.3–1.2)
Total Protein: 5.7 g/dL — ABNORMAL LOW (ref 6.5–8.1)

## 2020-02-11 LAB — CBC WITH DIFFERENTIAL/PLATELET
Abs Immature Granulocytes: 0.01 10*3/uL (ref 0.00–0.07)
Basophils Absolute: 0 10*3/uL (ref 0.0–0.1)
Basophils Relative: 1 %
Eosinophils Absolute: 0.1 10*3/uL (ref 0.0–0.5)
Eosinophils Relative: 3 %
HCT: 32.2 % — ABNORMAL LOW (ref 36.0–46.0)
Hemoglobin: 9.8 g/dL — ABNORMAL LOW (ref 12.0–15.0)
Immature Granulocytes: 0 %
Lymphocytes Relative: 43 %
Lymphs Abs: 2.2 10*3/uL (ref 0.7–4.0)
MCH: 25.8 pg — ABNORMAL LOW (ref 26.0–34.0)
MCHC: 30.4 g/dL (ref 30.0–36.0)
MCV: 84.7 fL (ref 80.0–100.0)
Monocytes Absolute: 0.4 10*3/uL (ref 0.1–1.0)
Monocytes Relative: 7 %
Neutro Abs: 2.4 10*3/uL (ref 1.7–7.7)
Neutrophils Relative %: 46 %
Platelets: 248 10*3/uL (ref 150–400)
RBC: 3.8 MIL/uL — ABNORMAL LOW (ref 3.87–5.11)
RDW: 19.9 % — ABNORMAL HIGH (ref 11.5–15.5)
WBC: 5.2 10*3/uL (ref 4.0–10.5)
nRBC: 0 % (ref 0.0–0.2)

## 2020-02-11 LAB — GLUCOSE, CAPILLARY
Glucose-Capillary: 137 mg/dL — ABNORMAL HIGH (ref 70–99)
Glucose-Capillary: 248 mg/dL — ABNORMAL HIGH (ref 70–99)
Glucose-Capillary: 149 mg/dL — ABNORMAL HIGH (ref 70–99)
Glucose-Capillary: 151 mg/dL — ABNORMAL HIGH (ref 70–99)

## 2020-02-11 LAB — ANA W/REFLEX IF POSITIVE: Anti Nuclear Antibody (ANA): NEGATIVE

## 2020-02-11 LAB — MAGNESIUM: Magnesium: 1.6 mg/dL — ABNORMAL LOW (ref 1.7–2.4)

## 2020-02-11 MED ORDER — LABETALOL HCL 5 MG/ML IV SOLN: 10.0000 mg | INTRAVENOUS | Status: DC | PRN

## 2020-02-11 MED ORDER — MAGNESIUM SULFATE 2 GM/50ML IV SOLN
2.0000 g | Freq: Once | INTRAVENOUS | Status: AC
  Administered 2020-02-11: 2 g via INTRAVENOUS
  Filled 2020-02-11: qty 50

## 2020-02-11 NOTE — NC FL2 (Signed)
[place] MEDICAID FL2 LEVEL OF CARE SCREENING TOOL     IDENTIFICATION  Patient Name: Kaitlyn Gray Birthdate: August 31, 1975 Sex: female Admission Date (Current Location): 02/08/2020  [facility] and [place] Number:  Producer, television/film/video and Address:  [facility], [address] [place], [place] [address]      Provider Number: [id number]  Attending Physician Name and Address:  Merlene Laughter, DO  Relative Name and Phone Number:  Victorino Dike, aunt    Current Level of Care: Hospital Recommended Level of Care: Skilled Nursing Facility Prior Approval Number:    Date Approved/Denied:   PASRR Number: PENDING  Discharge Plan: SNF    Current Diagnoses: Patient Active Problem List   Diagnosis Date Noted  . Normocytic anemia 02/09/2020  . Ischemic stroke (HCC) 01/01/2020  . Acute encephalopathy 05/12/2018  . Diabetes mellitus without complication (HCC) 12/06/2017  . Hypokalemia 12/06/2017  . Hypertension 12/06/2017  . 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 Bipolar disorder (HCC) 07/19/2017  . Polysubstance abuse (HCC) 07/19/2017  . Chest pain 07/19/2017  . Cocaine use disorder, severe, dependence (HCC) 03/24/2015  . Cannabis use disorder, moderate, dependence (HCC) 03/24/2015  . MDD (major depressive disorder), recurrent severe, 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    Orientation RESPIRATION BLADDER Height & Weight     Self, Time, Situation, Place  Normal Incontinent, External catheter Weight:   Height:  5\' 4"  (162.6 cm)  BEHAVIORAL SYMPTOMS/MOOD NEUROLOGICAL BOWEL NUTRITION STATUS      Continent Diet (See discharge summary)  AMBULATORY STATUS COMMUNICATION OF NEEDS Skin   Extensive Assist Verbally Normal                       Personal Care Assistance Level of Assistance  Bathing, Feeding, Dressing Bathing Assistance: Limited assistance Feeding assistance: Independent Dressing  Assistance: Limited assistance     Functional Limitations Info  Sight, Speech, Hearing Sight Info: Adequate Hearing Info: Adequate Speech Info: Impaired (slurred, dysarthria)    SPECIAL CARE FACTORS FREQUENCY  PT (By licensed PT), OT (By licensed OT)     PT Frequency: 5x a week OT Frequency: 5x a week            Contractures Contractures Info: Not present    Additional Factors Info  Code Status, Allergies Code Status Info: FULL Allergies Info: Hydrocodone; Latex           Current Medications (02/11/2020):  This is the current hospital active medication list Current Facility-Administered Medications  Medication Dose Route Frequency Provider Last Rate Last Admin  .  stroke: mapping our early stages of recovery book   Does not apply Once Pahwani, Rinka R, MD      . acetaminophen (TYLENOL) tablet 650 mg  650 mg Oral Q4H PRN Pahwani, Rinka R, MD       Or  . acetaminophen (TYLENOL) 160 MG/5ML solution 650 mg  650 mg Per Tube Q4H PRN Pahwani, Rinka R, MD       Or  . acetaminophen (TYLENOL) suppository 650 mg  650 mg Rectal Q4H PRN Pahwani, Rinka R, MD      . aspirin EC tablet 81 mg  81 mg Oral Daily 02/13/2020 Latif, DO   81 mg at 02/10/20 1545  . atorvastatin (LIPITOR) tablet 80 mg  80 mg Oral Daily Pahwani, Rinka R, MD   80 mg at 02/10/20 1013  . bisacodyl (DULCOLAX) EC tablet 5 mg  5 mg Oral Daily PRN Opyd, Lavone Neri, MD      . clopidogrel (PLAVIX) tablet 75 mg  75 mg Oral Daily Marguerita Merles [place], DO   75 mg at 02/10/20 1545  . enoxaparin (LOVENOX) injection 40 mg  40 mg Subcutaneous Q24H Pahwani, Rinka R, MD      . insulin aspart (novoLOG) injection 0-15 Units  0-15 Units Subcutaneous TID WC Pahwani, Rinka R, MD   2 Units at 02/11/20 0647  . insulin aspart (novoLOG) injection 0-5 Units  0-5 Units Subcutaneous QHS Pahwani, Rinka R, MD      . labetalol (NORMODYNE) injection 10 mg  10 mg Intravenous Q2H PRN Opyd, Lavone Neri, MD      . magnesium sulfate IVPB 2 g 50 mL  2 g  Intravenous Once Sheikh, Omair Latif, DO      . polyethylene glycol (MIRALAX / GLYCOLAX) packet 17 g  17 g Oral Daily PRN Opyd, Lavone Neri, MD      . sodium chloride flush (NS) 0.9 % injection 3 mL  3 mL Intravenous Once Trifan, Kermit Balo, MD         Discharge Medications: Please see discharge summary for a list of discharge medications.  Relevant Imaging Results:  Relevant Lab Results:   Additional Information SSN [id number]  Cleda Clarks, [place]

## 2020-02-11 NOTE — TOC Progression Note (Signed)
Transition of Care [facility]) - Progression Note    Patient Details  Name: Kaitlyn Gray MRN: [id number] Date of Birth: [birth year]  Transition of Care [facility]) CM/SW Contact  Cleda Clarks, [place] Phone Number: 02/11/2020, 10:43 AM  Clinical Narrative:    CSW spoke with patient's aunt Victorino Dike over the phone regarding PT recommendation for SNF placement. Patient's aunt expressed she wants patient to move to [place] with her after SNF placement and would like patient to get a hospital bed, hoyer lift, wheelchair and other needed DME. CSW explained placement depends on what SNFs are able to take patient's Medicaid, medical needs, etc. CSW agreed to keep patient's aunt updated.    Expected Discharge Plan: Skilled Nursing Facility Barriers to Discharge: SNF Pending bed offer  Expected Discharge Plan and Services Expected Discharge Plan: Skilled Nursing Facility In-house Referral: Clinical Social Work   Post Acute Care Choice: Skilled Nursing Facility Living arrangements for the past 2 months: Single Family Home                                       Social Determinants of Health (SDOH) Interventions    Readmission Risk Interventions No flowsheet data found.

## 2020-02-11 NOTE — Evaluation (Signed)
Clinical/Bedside Swallow Evaluation Patient Details  Name: Kaitlyn Gray MRN: [id number] Date of Birth: Feb 21, 1976  Today's Date: 02/11/2020 Time: SLP Start Time (ACUTE ONLY): 1011 SLP Stop Time (ACUTE ONLY): 1025 SLP Time Calculation (min) (ACUTE ONLY): 14 min  Past Medical History:  Past Medical History:  Diagnosis Date  . Bipolar 1 disorder (HCC)   . 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 Hypertension    Past Surgical History:  Past Surgical History:  Procedure Laterality Date  . CESAREAN SECTION     HPI:  Kaitlyn Gray is a 44 y.o. female with history of stroke back in July 2021, hypertension, diabetes and bipolar 1 disorder.  Patient presented to the ED with left-sided weakness and facial numbness, onset 12 hours prior to arrival. MRI: did show an acute right pontine infarct and evolving right MCP stroke.   Assessment / Plan / Recommendation Clinical Impression  Pt encountered on edge of bed with boyfriend present and copious, masticated food in buccal cavity, spilling anteriorally, with open mouth mastication and vocalizing. Although left facial/buccal is weak and desensate, pocketing occured on right this assessment. Max cues fading to moderate needed for single bites, oral clearance and smaller volumes which she return demonstrated x 2 at end of evaluation. Mild and intermittent throat clears. She did not have coughing with thin or solids but risk is very high. Boyfriend states he cues her not to lie down when eating and decrease rate. He reported pt "packed in the food" prior to having her strokes. Reiterated purpose and potential consequences of swallow precautions and possible need to downgrade texture to puree given location of stroke and clinical observations. For now, will modify to Dys 3, continue thin, pills whole in puree and precautions (masticate on right, single small bites, check for pocketing). She will need  continued therapy on acute and post acute services.       Full supervision with meals.  SLP Visit Diagnosis: Dysphagia, unspecified (R13.10)    Aspiration Risk  Mild aspiration risk;Moderate aspiration risk    Diet Recommendation Dysphagia 3 (Mech soft);Thin liquid   Liquid Administration via: Straw;Cup Medication Administration: Whole meds with puree Supervision: Patient able to self feed;Full supervision/cueing for compensatory strategies Compensations: Minimize environmental distractions;Slow rate;Small sips/bites;Lingual sweep for clearance of pocketing;Other (Comment);Clear throat intermittently (masticate on right side) Postural Changes: Seated upright at 90 degrees    Other  Recommendations Oral Care Recommendations: Oral care BID   Follow up Recommendations 24 hour supervision/assistance;Inpatient Rehab      Frequency and Duration min 2x/week  2 weeks       Prognosis Prognosis for Safe Diet Advancement: Good Barriers to Reach Goals: Cognitive deficits;Severity of deficits      Swallow Study   General Date of Onset: 02/08/20 HPI: Kaitlyn Gray is a 44 y.o. female with history of stroke back in July 2021, hypertension, diabetes and bipolar 1 disorder.  Patient presented to the ED with left-sided weakness and facial numbness, onset 12 hours prior to arrival. MRI: did show an acute right pontine infarct and evolving right MCP stroke. Type of Study: Bedside Swallow Evaluation Previous Swallow Assessment:  (no prior swallow) Diet Prior to this Study: Regular;Thin liquids Temperature Spikes Noted: No Respiratory Status: Room air History of Recent Intubation: No Behavior/Cognition: Alert;Cooperative;Pleasant mood;Impulsive;Distractible;Requires cueing Oral Cavity Assessment: Within Functional Limits Oral Care Completed by SLP: No Oral Cavity - Dentition: Missing dentition (missing posterior) Vision: Functional for self-feeding Self-Feeding Abilities: Needs  set up;Needs  assist;Able to feed self Patient Positioning: Other (comment) (edge of bed) Baseline Vocal Quality: Normal Volitional Cough: Strong Volitional Swallow: Able to elicit    Oral/Motor/Sensory Function Overall Oral Motor/Sensory Function: Moderate impairment Facial ROM: Reduced left;Suspected CN VII (facial) dysfunction Facial Symmetry: Abnormal symmetry left;Suspected CN VII (facial) dysfunction Facial Strength: Reduced left;Suspected CN VII (facial) dysfunction Facial Sensation: Reduced left;Suspected CN V (Trigeminal) dysfunction Lingual ROM: Within Functional Limits Lingual Symmetry: Within Functional Limits Lingual Strength: Within Functional Limits   Ice Chips Ice chips: Not tested   Thin Liquid Thin Liquid: Impaired Presentation: Straw Oral Phase Impairments: Reduced labial seal Oral Phase Functional Implications: Left anterior spillage Pharyngeal  Phase Impairments: Throat Clearing - Immediate;Throat Clearing - Delayed    Nectar Thick Nectar Thick Liquid: Not tested   Honey Thick Honey Thick Liquid: Not tested   Puree Puree: Impaired Oral Phase Impairments: Reduced labial seal Oral Phase Functional Implications:  (left facial residue) Pharyngeal Phase Impairments:  (none)   Solid     Solid: Impaired Presentation: Self Fed Oral Phase Impairments: Reduced labial seal;Reduced lingual movement/coordination;Impaired mastication;Poor awareness of bolus Oral Phase Functional Implications: Right anterior spillage;Left anterior spillage;Left lateral sulci pocketing;Right lateral sulci pocketing;Impaired mastication;Oral residue Pharyngeal Phase Impairments: Other (comments) (none)      Roque Cash, Breck Coons 02/11/2020,11:07 AM   Breck Coons Lonell Face.Ed Nurse, children's [phone] Office [phone]

## 2020-02-11 NOTE — Progress Notes (Signed)
Physical Therapy Treatment Patient Details Name: Kaitlyn Gray MRN: [id number] DOB: 11/13/1975 Today's Date: 02/11/2020    History of Present Illness Kaitlyn Gray is a 44 y.o. female with history of stroke back in July 2021, hypertension, diabetes and bipolar 1 disorder.  Patient presented to the ED with left-sided weakness and facial numbness, onset 12 hours prior to arrival. MRI:MRI did show an acute right pontine infarct and evolving right MCP stroke.    PT Comments    Pt ambulating to bathroom with assist from RN and boyfriend.  Received sitting on commode.  Focused on gt and transfer training.  Continue to recommend snf placement for continued recovery.     Follow Up Recommendations  SNF;Supervision/Assistance - 24 hour     Equipment Recommendations       Recommendations for Other Services       Precautions / Restrictions Precautions Precautions: Fall Restrictions Weight Bearing Restrictions: No    Mobility  Bed Mobility               General bed mobility comments: Pt seated on commode on arrival.  Transfers Overall transfer level: Needs assistance Equipment used: Rolling walker (2 wheeled) Transfers: Sit to/from Stand Sit to Stand: Min assist (from commode with heavy reliance on rail in bathroom to pull into standing.) Stand pivot transfers: Min assist       General transfer comment: Min assistance to pull into standing with grab bar in bathroom.  Ambulation/Gait Ambulation/Gait assistance: Mod assist Gait Distance (Feet): 45 Feet Assistive device: Rolling walker (2 wheeled) Gait Pattern/deviations: Decreased stride length;Shuffle;Decreased step length - left;Decreased dorsiflexion - left Gait velocity: Decreased   General Gait Details: Pt able to progress to gt with L sided weakness.  Cues for L stride length and foot clearance.  Pt required hand over hand assistance to keep L hand and wrist on RW.  Pt required assistance to steer  RW.   Stairs             Wheelchair Mobility    Modified Rankin (Stroke Patients Only) Modified Rankin (Stroke Patients Only) Pre-Morbid Rankin Score: Moderately severe disability Modified Rankin: Moderately severe disability     Balance Overall balance assessment: Needs assistance Sitting-balance support: Feet supported Sitting balance-Leahy Scale: Fair Sitting balance - Comments: requires guarding for safety     Standing balance-Leahy Scale: Poor Standing balance comment: requires mod assist for balance in standing                            Cognition Arousal/Alertness: Awake/alert Behavior During Therapy: WFL for tasks assessed/performed Overall Cognitive Status: History of cognitive impairments - at baseline Area of Impairment: Safety/judgement;Following commands;Memory;Attention;Awareness;Problem solving                   Current Attention Level: Sustained Memory: Decreased recall of precautions;Decreased short-term memory Following Commands: Follows one step commands with increased time Safety/Judgement: Decreased awareness of safety Awareness: Intellectual Problem Solving: Difficulty sequencing;Requires verbal cues;Requires tactile cues General Comments: Cognitive deficits at baseline. Required safety cues throughout.       Exercises      General Comments        Pertinent Vitals/Pain Pain Assessment: No/denies pain Faces Pain Scale: No hurt    Home Living       Type of Home: Apartment              Prior Function  PT Goals (current goals can now be found in the care plan section) Acute Rehab PT Goals Patient Stated Goal: none stated Potential to Achieve Goals: Fair Progress towards PT goals: Progressing toward goals    Frequency    Min 3X/week      PT Plan Current plan remains appropriate    Co-evaluation              AM-PAC PT "6 Clicks" Mobility   Outcome Measure  Help needed  turning from your back to your side while in a flat bed without using bedrails?: A Little Help needed moving from lying on your back to sitting on the side of a flat bed without using bedrails?: A Little Help needed moving to and from a bed to a chair (including a wheelchair)?: A Little Help needed standing up from a chair using your arms (e.g., wheelchair or bedside chair)?: A Little Help needed to walk in hospital room?: A Lot Help needed climbing 3-5 steps with a railing? : Total 6 Click Score: 15    End of Session Equipment Utilized During Treatment: Gait belt Activity Tolerance: Patient tolerated treatment well Patient left: in chair;with chair alarm set;with family/visitor present Nurse Communication: Mobility status PT Visit Diagnosis: Other abnormalities of gait and mobility (R26.89);History of falling (Z91.81);Repeated falls (R29.6);Muscle weakness (generalized) (M62.81);Hemiplegia and hemiparesis Hemiplegia - Right/Left: Left Hemiplegia - dominant/non-dominant: Non-dominant Hemiplegia - caused by: Cerebral infarction     Time: 8563-1497 PT Time Calculation (min) (ACUTE ONLY): 29 min  Charges:  $Gait Training: 8-22 mins $Therapeutic Activity: 8-22 mins                     Bonney Leitz , PTA Acute Rehabilitation Services Pager [phone] Office [phone]     Saleha Kalp Artis Delay 02/11/2020, 11:44 AM

## 2020-02-11 NOTE — Evaluation (Addendum)
Speech Language Pathology Evaluation Patient Details Name: Kaitlyn Gray MRN: [id number] DOB: Nov 07, 1975 Today's Date: 02/11/2020 Time: 2202-5427 SLP Time Calculation (min) (ACUTE ONLY): 14 min  Problem List:  Patient Active Problem List   Diagnosis Date Noted  . Normocytic anemia 02/09/2020  . Ischemic stroke (HCC) 01/01/2020  . Acute encephalopathy 05/12/2018  . Diabetes mellitus without complication (HCC) 12/06/2017  . Hypokalemia 12/06/2017  . Hypertension 12/06/2017  . 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 Bipolar disorder (HCC) 07/19/2017  . Polysubstance abuse (HCC) 07/19/2017  . Chest pain 07/19/2017  . Cocaine use disorder, severe, dependence (HCC) 03/24/2015  . Cannabis use disorder, moderate, dependence (HCC) 03/24/2015  . MDD (major depressive disorder), recurrent severe, 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   Past Medical History:  Past Medical History:  Diagnosis Date  . Bipolar 1 disorder (HCC)   . 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 Hypertension    Past Surgical History:  Past Surgical History:  Procedure Laterality Date  . CESAREAN SECTION     HPI:  Kaitlyn Gray is a 44 y.o. female with history of stroke back in July 2021, hypertension, diabetes and bipolar 1 disorder.  Patient presented to the ED with left-sided weakness and facial numbness, onset 12 hours prior to arrival. MRI: did show an acute right pontine infarct and evolving right MCP stroke.   Assessment / Plan / Recommendation Clinical Impression  Pt demonstrates impairments in cognitive ability and speech intelligibility. Pt seen by ST services during 12/2019 admission. From assessment and pt report her cognition has somewhat improved, speech intelligibility reduces and reports her physical impairments "are worse this time." Last admission SLUMS administered and she received a 10/30  compared to 22/30 on same evaluation today. Areas of challenge were word retrieval given a delay, simple calculation (with additional time was accurate), attention/memory for details from paragraph and divergent naming  (scored 2 out of 3). Keary exhibits impulsivity and difficulty monitoring and self correcting behaviors. ST will follow pt to increase independence re: intelligibility, cognition/safety awareness.     SLP Assessment  SLP Recommendation/Assessment: Patient needs continued Speech Lanaguage Pathology Services SLP Visit Diagnosis: Cognitive communication deficit (R41.841)    Follow Up Recommendations  Inpatient Rehab;24 hour supervision/assistance    Frequency and Duration min 2x/week  2 weeks      SLP Evaluation Cognition  Overall Cognitive Status: History of cognitive impairments - at baseline Arousal/Alertness: Awake/alert Orientation Level: Oriented to person;Oriented to place;Oriented to time Attention: Sustained Focused Attention: Appears intact Sustained Attention: Impaired Sustained Attention Impairment: Verbal basic;Functional basic Memory: Impaired Memory Impairment: Retrieval deficit;Decreased recall of new information Awareness: Impaired Awareness Impairment: Emergent impairment;Anticipatory impairment Problem Solving: Impaired Problem Solving Impairment: Verbal basic;Functional basic Executive Function: Self Monitoring;Self Correcting;Organizing Behaviors: Impulsive Safety/Judgment: Impaired       Comprehension  Auditory Comprehension Overall Auditory Comprehension: Appears within functional limits for tasks assessed Visual Recognition/Discrimination Discrimination: Not tested Reading Comprehension Reading Status:  (TBA)    Expression Expression Primary Mode of Expression: Verbal Verbal Expression Overall Verbal Expression: Appears within functional limits for tasks assessed Written Expression Dominant Hand: Right   Oral / Motor  Oral  Motor/Sensory Function Overall Oral Motor/Sensory Function: Moderate impairment Facial ROM: Reduced left;Suspected CN VII (facial) dysfunction Facial Symmetry: Abnormal symmetry left;Suspected CN VII (facial) dysfunction Facial Strength: Reduced left;Suspected CN VII (facial) dysfunction Facial Sensation: Reduced left;Suspected CN V (Trigeminal) dysfunction Lingual ROM: Within Functional Limits  Lingual Symmetry: Within Functional Limits Lingual Strength: Within Functional Limits Motor Speech Overall Motor Speech: Impaired Respiration: Within functional limits Phonation: Normal Resonance:  (? hyponasal (slight) will cont to assess) Articulation: Impaired Level of Impairment: Word Intelligibility: Intelligibility reduced Word: 75-100% accurate Phrase: 75-100% accurate Sentence: 75-100% accurate Conversation: 50-74% accurate Motor Planning: Witnin functional limits   GO                    Royce Macadamia 02/11/2020, 11:27 AM  Breck Coons Lonell Face.Ed Nurse, children's [phone] Office [phone]

## 2020-02-11 NOTE — Progress Notes (Signed)
PROGRESS NOTE    Kaitlyn Gray  QJJ:[id number] DOB: 1976/05/19 DOA: 02/08/2020 PCP: Patient, No Pcp Per   Brief Narrative: HPI per Dr. Ardean Larsen on 02/09/20 Kaitlyn Gray is a 44 y.o. 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  She is not really good historian.  She answered yes to almost all of my questions including headache, blurry vision, lightheadedness, dizziness, chest pain, shortness of breath, palpitation, nausea, vomiting, abdominal pain.  She denies fever, chills, cough, congestion.  She tells me that she received 1 dose of COVID-19 vaccine.  Of note: Patient admitted in July 2021 with ischemic stroke however she left AMA.  EEG was done which came back negative for acute findings, transthoracic echo showed ejection fraction of 55 to 60%, no PFO.  She is noncompliant with her medications.   She continues to smoke 1 pack of cigarettes per day, uses marijuana and cocaine however denies alcohol use.  She uses walker for ambulation.  ED Course: Upon arrival to ED: Patient's blood pressure was noted to be elevated, afebrile with no leukocytosis, CBC shows normocytic anemia, UDS positive for cocaine and marijuana, ethanol: Pending, COVID-19: Pending.  CT head shows developing low-attenuation with in right middle cerebral peduncle in keeping with an evolving infarct or focus of demyelination.  No hemorrhage.  MRI brain shows acute right pontine infarct.  EDP consulted neurology.  [facility] hospitalist consulted for admission for ischemic stroke.  **Interim History  Neurology evaluated and she was not very cooperative with the exam.  Continued to have a  paraplegia left facial droop and dysarthria as well as left leg weakness.  Neurology recommending no further work-up given that she had most of her work-up done last week and recommending aspirin 81 mg p.o. daily as well as clopidogrel 75 mg p.o. daily and continue dual antiplatelet therapy for 3 weeks and then just aspirin alone.  PT OT recommending SNF.  I have notified social work team about assistance with placement and she is being worked up  SLP downgraded her diet to Dysphagia 3 Thin Liquid given that she was pocketing the food. SLP recommends continued SLP at next facility and PT/OT still recommending SNF.   Assessment & Plan:   Principal Problem:   Ischemic stroke (HCC) Active Problems:   Cocaine use disorder, severe, dependence (HCC)   Cannabis use disorder, moderate, dependence (HCC)   Diabetes mellitus without complication (HCC)   Hypertension   Normocytic anemia  Ischemic Stroke: Right Pontine Infarct -Risk factors including hypertension, diabetes, previous history of stroke, noncompliance with medications.  Polysubstance abuse with THC and Cocaine.   -Reviewed CT head and MRI brain. -CT head showed "Developing low-attenuation within the right middle cerebral peduncle in keeping with an evolving infarct or focus of demyelination better seen on MRI of 01/01/2020. No evidence of acute intracranial hemorrhage or infarct. Moderate periventricular white matter changes are present more focal in the region of the atria of the lateral ventricles bilaterally, possibly the result of small vessel ischemia." -MRI Brain Showed "Acute right pontine infarct. Evolution of previous abnormality centered within the right middle cerebellar peduncle, which was likely also ischemic in etiology. Stable additional findings detailed above." -She had most of the work-up within the last month and had carotid Dopplers and 2D  echo on 01/02/2020 -Continued Telemetry monitoring -Stroke protocol initiated  -Allow  for permissive hypertension for the first 24-48h - only treat PRN if SBP >220 mmHg or diastolic blood pressure >120. Blood pressures can be gradually normalized to SBP<140 upon discharge. -Start on aspirin and high intensity statin; Neurology added Clopidogrel -Neurology recommending dual antiplatelet therapy for 3 weeks and then just aspirin alone -Frequent neuro checks -Checked A1c and was 6.2,  -Lipid panel done and showed total cholesterol/HDL ratio of 2.8, cholesterol level 147, HDL of 52, LDL of 75, triglycerides of 101, VLDL of 20 -Risk factor modification -PT/OT eval, Speech consult; PT OT recommending skilled nursing facility; SLP downgraded diet to Dysphagia 3 Today with Thin Liquids -Will need SNF placement and Social work consulted for assistance with Placement  Hypertension -Blood pressure was elevated upon arrival and still remains somewhat elevate; blood pressure today was 161/98 but we are allowing for permissive hypertension and gradually normalizing in 5 to 7 days -Hold home BP meds lisinopril to allow permissive hypertension.   -Avoid beta-blockers in the setting of cocaine use -Monitor blood pressure closely and treat only if greater than 220/120 for now with long-term blood pressure goal being normotensive  Hyperlipidemia -Lipid panel as above -Started on atorvastatin 80 mg daily given her acute stroke  Type 2 Diabetes Mellitus -Noncompliant with home medication.   -Hold Metformin.   -Started patient on sliding scale insulin.  Last A1c 7.5% and repeat this visit is 6.2 -CBGs ranging from 110-248  Normocytic Anemia -Patient's hemoglobin/hematocrit went from 10.2/34.0 is now 9.8/32.2 -Check anemia panel in the a.m.  -Continue to monitor for signs and symptoms of bleeding; currently no overt bleeding noted  -Repeat CBC in a.m.  Hypomagnesemia -Patient's Mag Level was 1.6 -Replete with IV Mag Sulfate 2 grams -Continue to Monitor and Replete as Necessary -Repeat  Mag Level in the AM   Polysubstance Abuse -UDS is positive for marijuana and cocaine and she has been positive in recent admission and ED visits -Counseled about cessation. -Teaching laboratory technician for [employer]  Tobacco Abuse -Counseled about cessation however she is not ready to quit yet.  Obesity -Estimated body mass index is 32.79 kg/m as calculated from the following:   Height as of this encounter: 5\' 4"  (1.626 m).   Weight as of 01/20/20: 86.6 kg. -Diet modification/exercise/weight loss recommended  Bipolar Disorder -Reviewed home meds.  Does not take any medications for bipolar at home. -Outpatient psych follow-up recommended.  DVT prophylaxis: Enoxaparin 40 mg subcu every 24 Code Status: FULL CODE  Family Communication: Boyfriend was at bedside  Disposition Plan: SNF  Status is: Inpatient  Remains inpatient appropriate because:Inpatient level of care appropriate due to severity of illness   Dispo: The patient is from: Home              Anticipated d/c is to: SNF              Anticipated d/c date is: 1 day              Patient currently is medically stable to d/c.  Consultants:   Neurology   Procedures: Head CT and MRI  Antimicrobials:  Anti-infectives (From admission, onward)   None     Subjective: Seen and examined at bedside and sitting in the chair wanting to sleep and not really wanting to interact with me. Would not really answer questions but would shake her head yes and no. I asked if she was having any pain and  she shook her yes but remains calm and also asked about abdominal pain and she again nodded yes but when palpating her abdomen she did not flinch or grimace. No other concerns or complaints at this time and significant other at bedside feels she did a little better walking today with Physical Therapy.   Objective: Vitals:   02/11/20 0006 02/11/20 0415 02/11/20 0802 02/11/20 1312  BP: (!) 195/92 (!) 185/91 (!) 155/85 (!) 161/98  Pulse: 71  62 (!) 58 69  Resp: Temp: 98.7 F (37.1 C) 98.6 F (37 C) 98.1 F (36.7 C) 97.9 F (36.6 C)  TempSrc:   Oral Oral  SpO2: 100% 100% 100% 100%  Height:        Intake/Output Summary (Last 24 hours) at 02/11/2020 1537 Last data filed at 02/11/2020 0606 Gross per 24 hour  Intake 120 ml  Output 1100 ml  Net -980 ml   There were no vitals filed for this visit.  Examination: Physical Exam:  Constitutional: WN/WD obese African-American female sitting in a chair at bedside in no distress appears calm and resting and did not really want to speak as she wanted to sleep Eyes: Lids and conjunctivae normal, sclerae anicteric  ENMT: External Ears, Nose appear normal. Grossly normal hearing.  Neck: Appears normal, supple, no cervical masses, normal ROM, no appreciable thyromegaly; no JVD Respiratory: Diminished to auscultation bilaterally, no wheezing, rales, rhonchi or crackles. Normal respiratory effort and patient is not tachypenic. No accessory muscle use.  Unlabored breathing Cardiovascular: RRR, no murmurs / rubs / gallops. S1 and S2 auscultated.  Trace lower extremity edema Abdomen: Soft, non-tender, distended secondary to body habitus. Bowel sounds positive.  GU: Deferred. Musculoskeletal: No clubbing / cyanosis of digits/nails. No joint deformity upper and lower extremities.  Skin: No rashes, lesions, ulcers on limited skin evaluation. No induration; Warm and dry.  Neurologic: CN 2-12 grossly intact with no focal deficits but she does have a little bit of a facial droop and also has some hemiplegia and cannot really move her left arm.  Leg is also significantly weak compared to the right Psychiatric: Normal judgment and insight. Alert and oriented x 3. Sleepy mood and appropriate affect.   Data Reviewed: I have personally reviewed following labs and imaging studies  CBC: Recent Labs  Lab 02/09/20 0017 02/09/20 0029 02/09/20 1806 02/10/20 0328 02/11/20 0144  WBC 4.9   --  4.8 5.3 5.2  NEUTROABS 2.9  --   --  2.5 2.4  HGB 10.6* 11.6* 10.2* 9.8* 9.8*  HCT 35.2* 34.0* 34.0* 32.9* 32.2*  MCV 85.2  --  85.6 85.2 84.7  PLT 302  --  276 248 248   Basic Metabolic Panel: Recent Labs  Lab 02/09/20 0017 02/09/20 0029 02/09/20 1806 02/10/20 0328 02/11/20 0144  NA 139 143  --  138 137  K 3.7 3.5  --  3.5 3.5  CL 106 106  --  108 106  CO2 23  --   --  22 24  GLUCOSE 159* 154*  --  132* 135*  BUN 10 11  --  11 12  CREATININE 1.11* 1.10* 0.91 0.96 0.87  CALCIUM 9.0  --   --  8.6* 8.8*  MG  --   --   --   --  1.6*  PHOS  --   --   --   --  3.3   GFR: CrCl cannot be calculated (Unknown ideal weight.). Liver Function Tests: Recent  Labs  Lab 02/09/20 0017 02/11/20 0144  AST 15 12*  ALT 13 12  ALKPHOS 65 57  BILITOT 0.2* 0.3  PROT 6.8 5.7*  ALBUMIN 3.5 2.9*   No results for input(s): LIPASE, AMYLASE in the last 168 hours. No results for input(s): AMMONIA in the last 168 hours. Coagulation Profile: Recent Labs  Lab 02/09/20 0017  INR 1.0   Cardiac Enzymes: No results for input(s): CKTOTAL, CKMB, CKMBINDEX, TROPONINI in the last 168 hours. BNP (last 3 results) No results for input(s): PROBNP in the last 8760 hours. HbA1C: Recent Labs    02/10/20 0328  HGBA1C 6.2*   CBG: Recent Labs  Lab 02/10/20 1153 02/10/20 1633 02/10/20 2031 02/11/20 0557 02/11/20 1221  GLUCAP 117* 188* 110* 137* 248*   Lipid Profile: Recent Labs    02/10/20 0328  CHOL 147  HDL 52  LDLCALC 75  TRIG 101  CHOLHDL 2.8   Thyroid Function Tests: No results for input(s): TSH, T4TOTAL, FREET4, T3FREE, THYROIDAB in the last 72 hours. Anemia Panel: No results for input(s): VITAMINB12, FOLATE, FERRITIN, TIBC, IRON, RETICCTPCT in the last 72 hours. Sepsis Labs: No results for input(s): PROCALCITON, LATICACIDVEN in the last 168 hours.  Recent Results (from the past 240 hour(s))  Culture, blood (routine x 2)     Status: None   Collection Time: 02/01/20  5:55  PM   Specimen: BLOOD  Result Value Ref Range Status   Specimen Description BLOOD SITE NOT SPECIFIED  Final   Special Requests   Final    BOTTLES DRAWN AEROBIC AND ANAEROBIC Blood Culture results may not be optimal due to an excessive volume of blood received in culture bottles   Culture   Final    NO GROWTH 5 DAYS Performed at [facility], [address], [place], [place] [address]    Report Status 02/06/2020 FINAL  Final  SARS Coronavirus 2 by RT PCR (hospital order, performed in [facility] hospital lab) Nasopharyngeal Nasopharyngeal Swab     Status: None   Collection Time: 02/09/20  3:47 PM   Specimen: Nasopharyngeal Swab  Result Value Ref Range Status   SARS Coronavirus 2 NEGATIVE NEGATIVE Final    Comment: (NOTE) SARS-CoV-2 target nucleic acids are NOT DETECTED.  The SARS-CoV-2 RNA is generally detectable in upper and lower respiratory specimens during the acute phase of infection. The lowest concentration of SARS-CoV-2 viral copies this assay can detect is 250 copies / mL. A negative result does not preclude SARS-CoV-2 infection and should not be used as the sole basis for treatment or other patient management decisions.  A negative result may occur with improper specimen collection / handling, submission of specimen other than nasopharyngeal swab, presence of viral mutation(s) within the areas targeted by this assay, and inadequate number of viral copies (<250 copies / mL). A negative result must be combined with clinical observations, patient history, and epidemiological information.  Fact Sheet for Patients:   BoilerBrush.com.cy  Fact Sheet for Healthcare Providers: https://pope.com/  This test is not yet approved or  cleared by the [place] FDA and has been authorized for detection and/or diagnosis of SARS-CoV-2 by FDA under an Emergency Use Authorization (EUA).  This EUA will remain in effect (meaning this  test can be used) for the duration of the COVID-19 declaration under Section 564(b)(1) of the Act, 21 U.S.C. section 360bbb-3(b)(1), unless the authorization is terminated or revoked sooner.  Performed at [facility], [address] [place], [place] [address]  RN Pressure Injury Documentation:     Estimated body mass index is 32.79 kg/m as calculated from the following:   Height as of this encounter: 5\' 4"  (1.626 m).   Weight as of 01/20/20: 86.6 kg.  Malnutrition Type:      Malnutrition Characteristics:      Nutrition Interventions:     Radiology Studies: No results found. Scheduled Meds: .  stroke: mapping our early stages of recovery book   Does not apply Once  . aspirin EC  81 mg Oral Daily  . atorvastatin  80 mg Oral Daily  . clopidogrel  75 mg Oral Daily  . enoxaparin (LOVENOX) injection  40 mg Subcutaneous Q24H  . insulin aspart  0-15 Units Subcutaneous TID WC  . insulin aspart  0-5 Units Subcutaneous QHS  . sodium chloride flush  3 mL Intravenous Once   Continuous Infusions:   LOS: 2 days   03/21/20, DO [facility] PAGER is on AMION  If 7PM-7AM, please contact night-coverage [email or web address]

## 2020-02-12 ENCOUNTER — Inpatient Hospital Stay (HOSPITAL_COMMUNITY): Payer: Medicaid Other

## 2020-02-12 DIAGNOSIS — R569 Unspecified convulsions: Secondary | ICD-10-CM

## 2020-02-12 DIAGNOSIS — R072 Precordial pain: Secondary | ICD-10-CM

## 2020-02-12 DIAGNOSIS — F4324 Adjustment disorder with disturbance of conduct: Secondary | ICD-10-CM

## 2020-02-12 DIAGNOSIS — R451 Restlessness and agitation: Secondary | ICD-10-CM

## 2020-02-12 HISTORY — DX: Adjustment disorder with disturbance of conduct: F43.24

## 2020-02-12 LAB — CBC WITH DIFFERENTIAL/PLATELET
Abs Immature Granulocytes: 0.01 10*3/uL (ref 0.00–0.07)
Basophils Absolute: 0 10*3/uL (ref 0.0–0.1)
Basophils Relative: 1 %
Eosinophils Absolute: 0.1 10*3/uL (ref 0.0–0.5)
Eosinophils Relative: 3 %
HCT: 31.3 % — ABNORMAL LOW (ref 36.0–46.0)
Hemoglobin: 9.5 g/dL — ABNORMAL LOW (ref 12.0–15.0)
Immature Granulocytes: 0 %
Lymphocytes Relative: 41 %
Lymphs Abs: 2 10*3/uL (ref 0.7–4.0)
MCH: 25.7 pg — ABNORMAL LOW (ref 26.0–34.0)
MCHC: 30.4 g/dL (ref 30.0–36.0)
MCV: 84.8 fL (ref 80.0–100.0)
Monocytes Absolute: 0.3 10*3/uL (ref 0.1–1.0)
Monocytes Relative: 6 %
Neutro Abs: 2.4 10*3/uL (ref 1.7–7.7)
Neutrophils Relative %: 49 %
Platelets: 240 10*3/uL (ref 150–400)
RBC: 3.69 MIL/uL — ABNORMAL LOW (ref 3.87–5.11)
RDW: 19.9 % — ABNORMAL HIGH (ref 11.5–15.5)
WBC: 4.8 10*3/uL (ref 4.0–10.5)
nRBC: 0 % (ref 0.0–0.2)

## 2020-02-12 LAB — PHOSPHORUS: Phosphorus: 3.6 mg/dL (ref 2.5–4.6)

## 2020-02-12 LAB — COMPREHENSIVE METABOLIC PANEL
ALT: 13 U/L (ref 0–44)
AST: 15 U/L (ref 15–41)
Albumin: 2.9 g/dL — ABNORMAL LOW (ref 3.5–5.0)
Alkaline Phosphatase: 54 U/L (ref 38–126)
Anion gap: 4 — ABNORMAL LOW (ref 5–15)
BUN: 15 mg/dL (ref 6–20)
CO2: 24 mmol/L (ref 22–32)
Calcium: 8.5 mg/dL — ABNORMAL LOW (ref 8.9–10.3)
Chloride: 110 mmol/L (ref 98–111)
Creatinine, Ser: 0.91 mg/dL (ref 0.44–1.00)
GFR calc Af Amer: >60 mL/min (ref >60)
GFR calc non Af Amer: >60 mL/min (ref >60)
Glucose, Bld: 169 mg/dL — ABNORMAL HIGH (ref 70–99)
Potassium: 3.8 mmol/L (ref 3.5–5.1)
Sodium: 138 mmol/L (ref 135–145)
Total Bilirubin: 0.4 mg/dL (ref 0.3–1.2)
Total Protein: 5.6 g/dL — ABNORMAL LOW (ref 6.5–8.1)

## 2020-02-12 LAB — RETICULOCYTES
Immature Retic Fract: 17.4 % — ABNORMAL HIGH (ref 2.3–15.9)
RBC.: 3.74 MIL/uL — ABNORMAL LOW (ref 3.87–5.11)
Retic Count, Absolute: 36.7 10*3/uL (ref 19.0–186.0)
Retic Ct Pct: 1 % (ref 0.4–3.1)

## 2020-02-12 LAB — FERRITIN: Ferritin: 12 ng/mL (ref 11–307)

## 2020-02-12 LAB — IRON AND TIBC
Iron: 50 ug/dL (ref 28–170)
Saturation Ratios: 15 % (ref 10.4–31.8)
TIBC: 329 ug/dL (ref 250–450)
UIBC: 279 ug/dL

## 2020-02-12 LAB — FOLATE: Folate: 5.4 ng/mL — ABNORMAL LOW (ref >5.9)

## 2020-02-12 LAB — TROPONIN I (HIGH SENSITIVITY)
Troponin I (High Sensitivity): 7 ng/L (ref <18)
Troponin I (High Sensitivity): 8 ng/L (ref <18)

## 2020-02-12 LAB — GLUCOSE, CAPILLARY: Glucose-Capillary: 125 mg/dL — ABNORMAL HIGH (ref 70–99)

## 2020-02-12 LAB — MAGNESIUM: Magnesium: 1.7 mg/dL (ref 1.7–2.4)

## 2020-02-12 LAB — VITAMIN B12: Vitamin B-12: 237 pg/mL (ref 180–914)

## 2020-02-12 IMAGING — DX DG CHEST 1V PORT
1 series · 1 of 1 positions shown · non-contrast
Comparison: [DATE], CT [DATE], chest x-ray [DATE]

CLINICAL DATA: Chest pain

EXAM:
PORTABLE CHEST 1 VIEW

[chest ap]
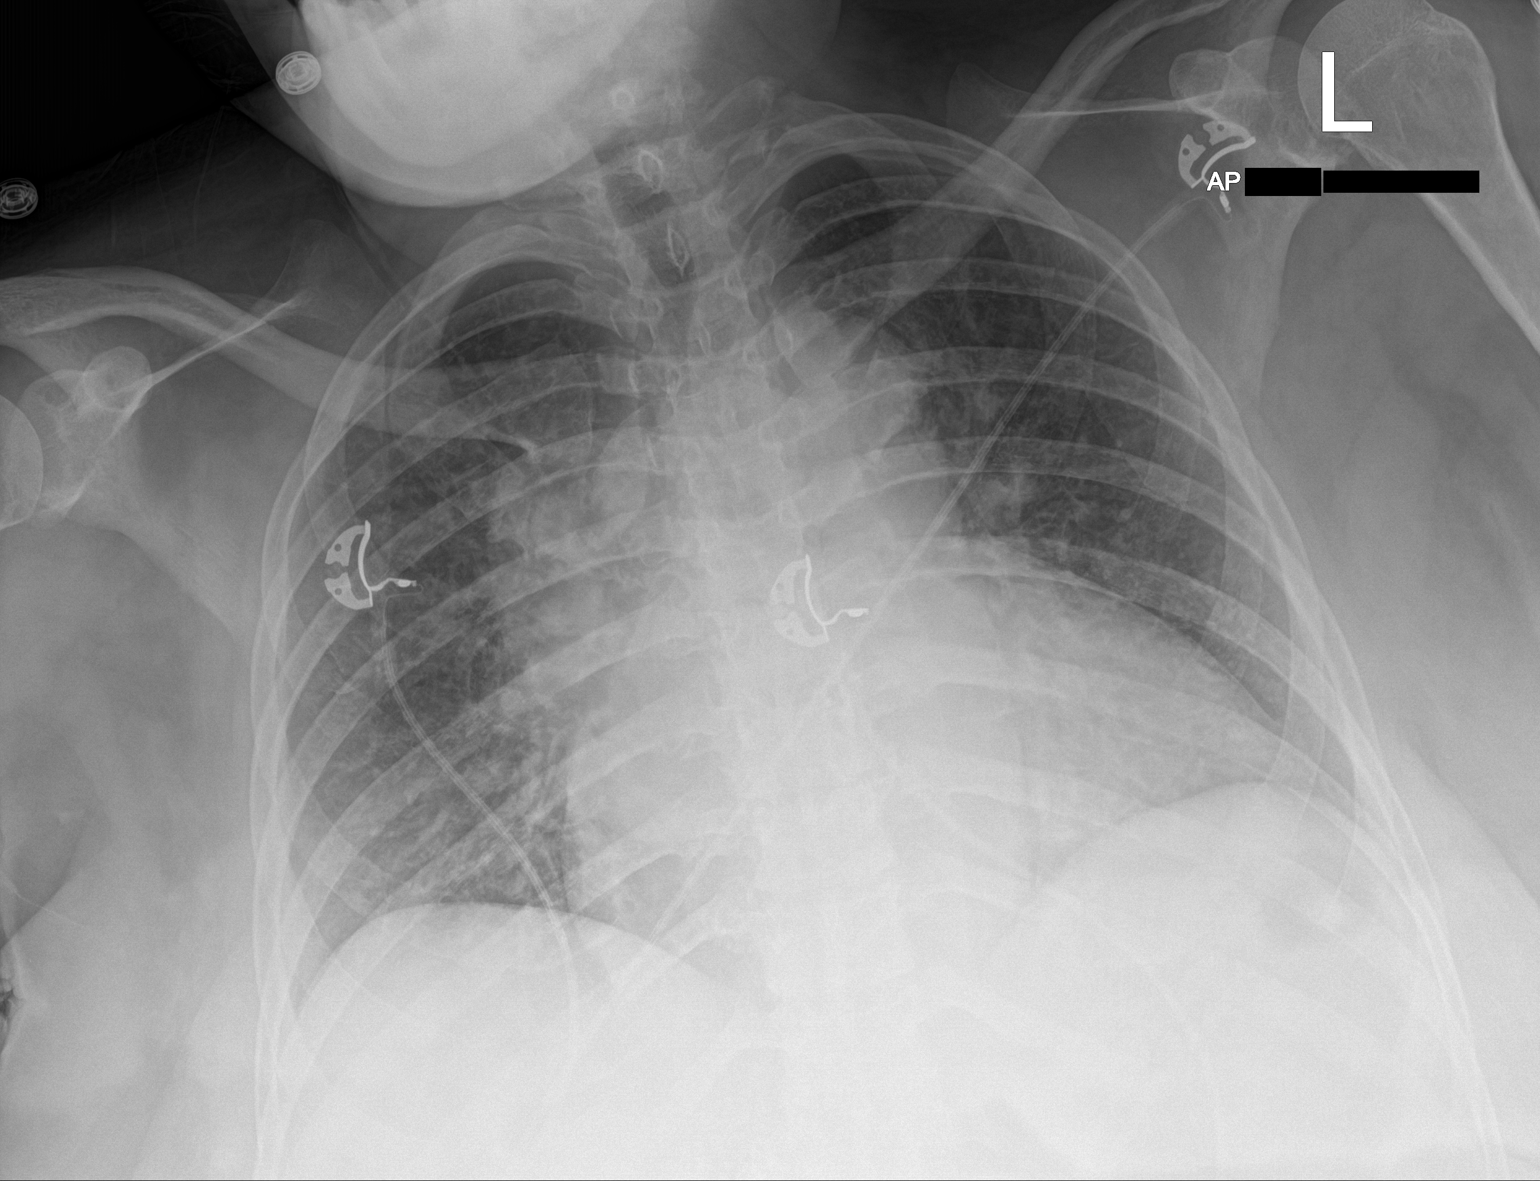

[1 of 1 positions shown; findings below may reference images not displayed]

FINDINGS: Cardiomegaly with central vascular congestion. No focal
consolidation or effusion. No pneumothorax.
IMPRESSION: Cardiomegaly with central vascular congestion.

## 2020-02-12 IMAGING — CT CT HEAD CODE STROKE
3 series · 14 of 47 positions shown, 16 images · non-contrast
Comparison: Prior MRI from [DATE].

CLINICAL DATA: Code stroke. Initial evaluation for acute
unresponsiveness.

EXAM:
CT HEAD WITHOUT CONTRAST
TECHNIQUE: Contiguous axial images were obtained from the base of the skull
through the vertex without intravenous contrast.

[Series 3: head 5.0 st · axial · 0.43mm/px · z∈[-151,-6]mm · 8 of 35 slices shown, 10 images]
[im 3/35  brain]
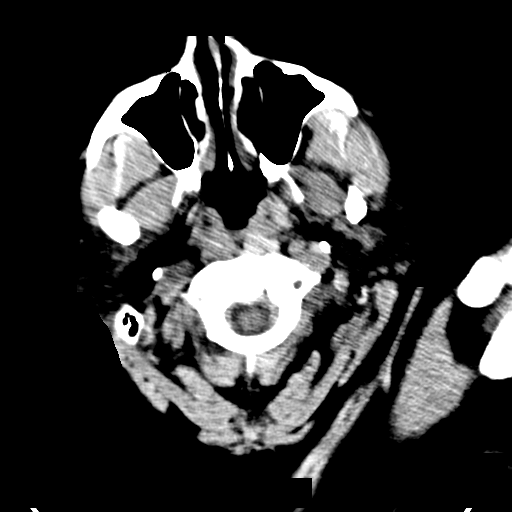
[im 3/35  bone]
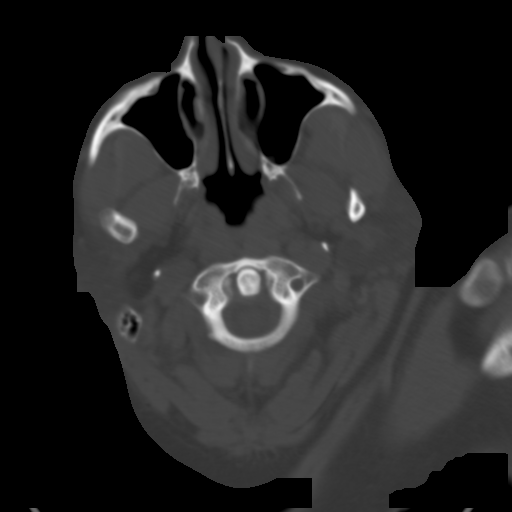
[im 8/35  brain]
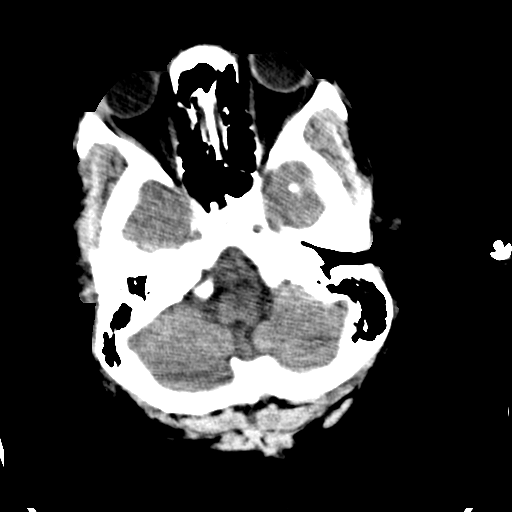
[im 11/35  brain]
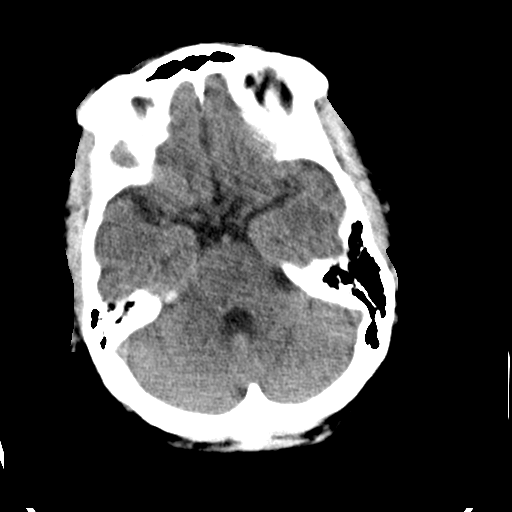
[im 16/35  brain]
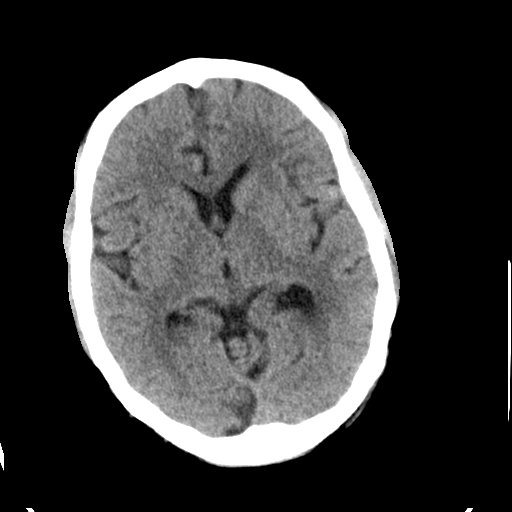
[im 19/35  brain]
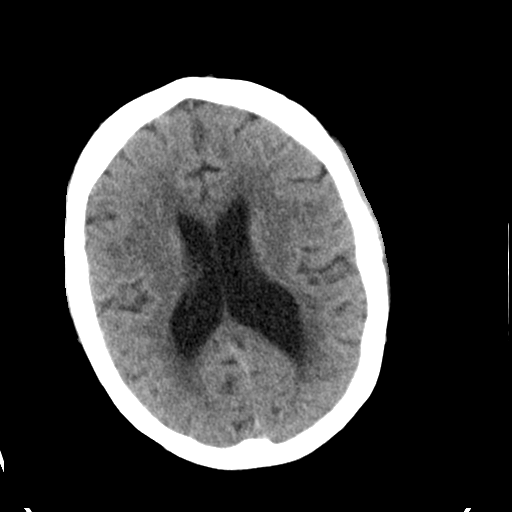
[im 19/35  bone]
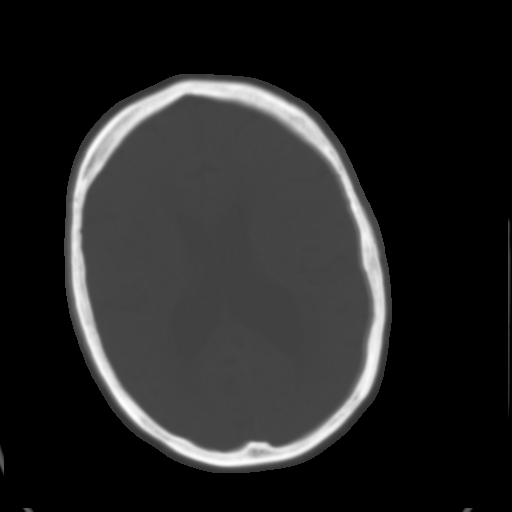
[im 24/35  brain]
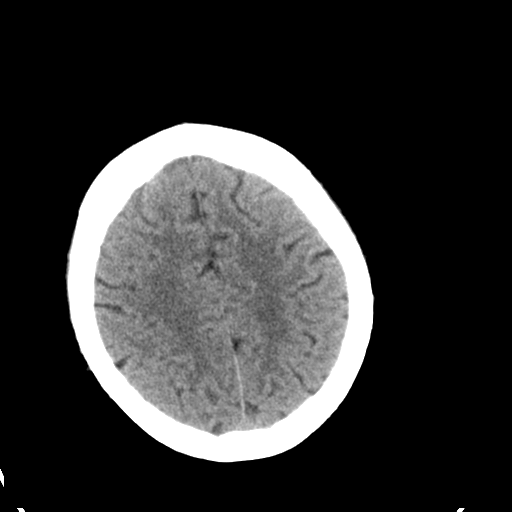
[im 27/35  brain]
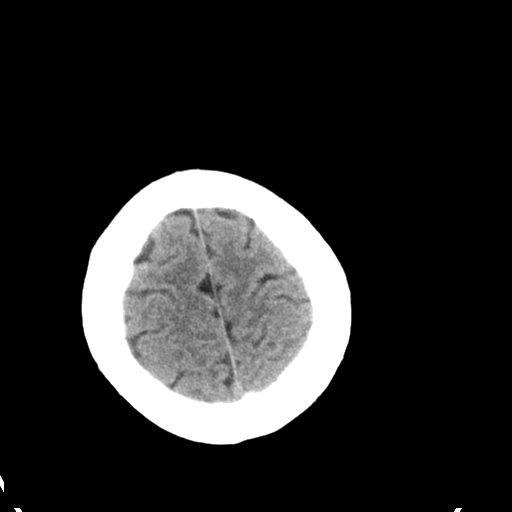
[im 32/35  brain]
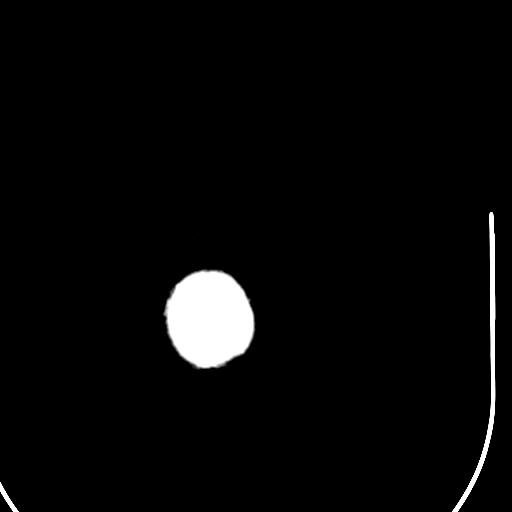

[Series 5: head 3.0 cor st · coronal · 0.34mm/px · 3 of 75 slices shown]
[im 25/75  brain]
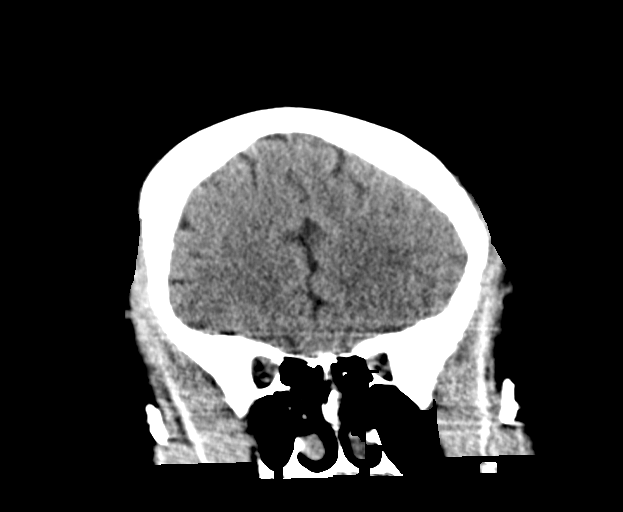
[im 33/75  brain]
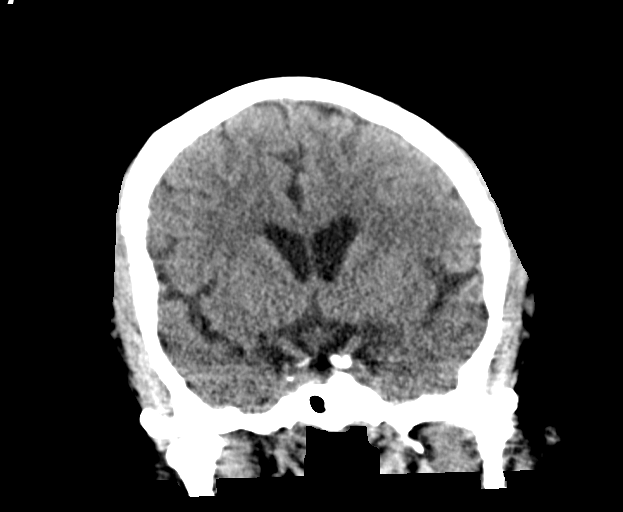
[im 42/75  brain]
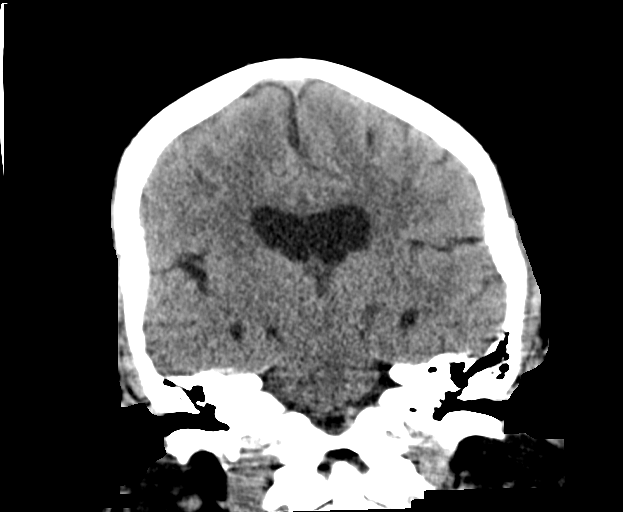

[Series 6: head 3.0 sag st · sagittal · 0.34mm/px · 3 of 66 slices shown]
[im 22/66  brain]
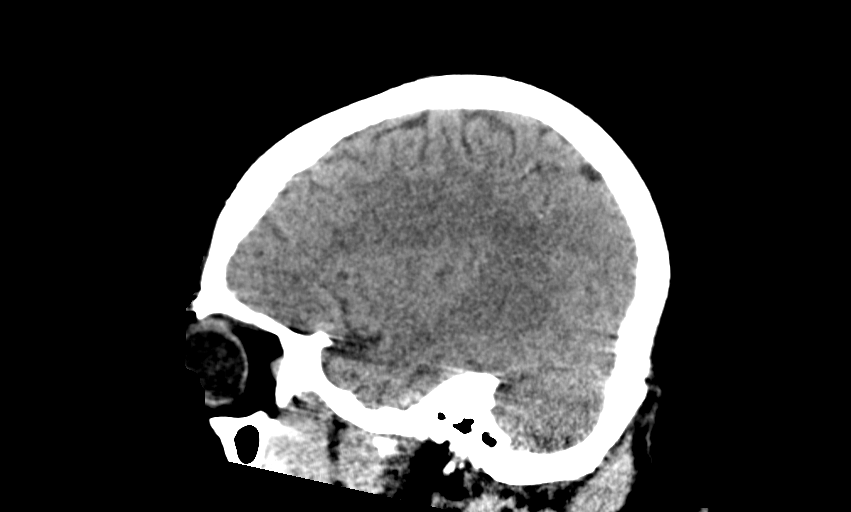
[im 33/66  brain]
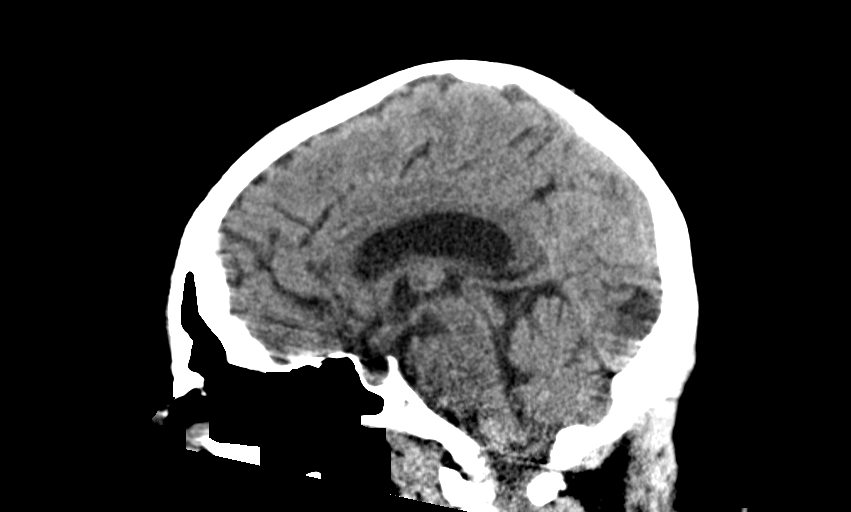
[im 44/66  brain]
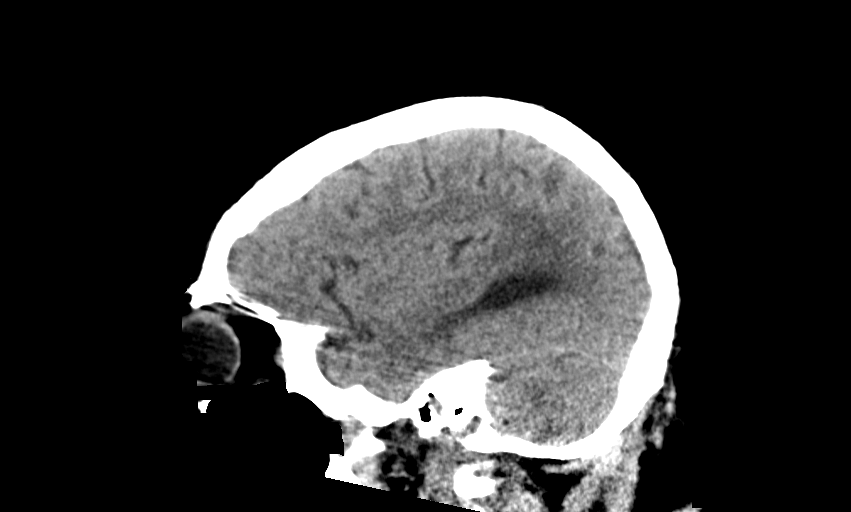

[14 of 47 positions shown; findings below may reference images not displayed]

FINDINGS: Brain: Examination mildly degraded by motion.

Age-related cerebral atrophy with chronic microvascular ischemic
disease. Recently identified right pontine infarct difficult to
visualize, but grossly stable in size from previous. Additional
ill-defined hypodensity at the right middle cerebellar peduncle also
grossly similar. No evidence for hemorrhagic transformation or
significant mass effect. Chronic right PCA territory infarct noted
as well.

No acute intracranial hemorrhage. No other acute large vessel
territory infarct. No mass lesion or midline shift. No hydrocephalus
or extra-axial fluid collection.

Vascular: No hyperdense vessel.

Skull: Scalp soft tissues and calvarium demonstrate no acute
finding.

Sinuses/Orbits: Globes and orbital soft tissues demonstrate no acute
finding. Remote fracture defect at the right lamina papyracea noted.
Small amount of pneumatized secretions noted within the left
sphenoid sinus. Tiny left maxillary sinus retention cyst. No mastoid
effusion.

Other: None.

ASPECTS (Alberta Stroke Program Early CT Score)

- Ganglionic level infarction (caudate, lentiform nuclei, internal
capsule, insula, M1-M3 cortex): 7

- Supraganglionic infarction (M4-M6 cortex): 3

Total score (0-10 with 10 being normal): 10
IMPRESSION: 1. No acute intracranial abnormality.
2. ASPECTS is 10.
3. Recently identified ischemic changes involving the right pons and
right middle cerebellar peduncle are grossly stable. No evidence for
hemorrhagic transformation, significant regional mass effect, or
other complication.

These results were communicated to Dr. TORANOSUKE at [DATE] TORANOSUKE
[DATE]by text page via the AMION messaging system.

## 2020-02-12 MED ORDER — FOLIC ACID 1 MG PO TABS
1.0000 mg | ORAL_TABLET | Freq: Every day | ORAL | Status: DC
  Administered 2020-02-13 – 2020-02-14 (×2): 1 mg via ORAL
  Filled 2020-02-12 (×2): qty 1

## 2020-02-12 MED ORDER — MAGNESIUM SULFATE 2 GM/50ML IV SOLN
2.0000 g | Freq: Once | INTRAVENOUS | Status: AC
  Administered 2020-02-12: 2 g via INTRAVENOUS
  Filled 2020-02-12: qty 50

## 2020-02-12 MED ORDER — FUROSEMIDE 10 MG/ML IJ SOLN
40.0000 mg | Freq: Once | INTRAMUSCULAR | Status: AC
  Administered 2020-02-12: 40 mg via INTRAVENOUS
  Filled 2020-02-12: qty 4

## 2020-02-12 MED ORDER — METOPROLOL TARTRATE 25 MG PO TABS: 25.0000 mg | ORAL_TABLET | Freq: Two times a day (BID) | ORAL | Status: DC

## 2020-02-12 MED ORDER — FLUOXETINE HCL 10 MG PO CAPS
10.0000 mg | ORAL_CAPSULE | Freq: Every day | ORAL | Status: DC
  Administered 2020-02-13 – 2020-02-14 (×2): 10 mg via ORAL
  Filled 2020-02-12 (×3): qty 1

## 2020-02-12 NOTE — Progress Notes (Signed)
Pt c/o chest pain, becoming increasingly lethargic. Not following commands, cannot answer question, "How old are you?"; pt stated, "64". Patient would not answer questions such as "What is your name?". BP high, MD notified and code stroke was called. Stroke team assessed patient. Boyfriend at bedside.

## 2020-02-12 NOTE — Progress Notes (Addendum)
Neurology Progress Note  Chief complaint: Code stroke  S:// Recalled for emergent stroke evaluation evaluation-decreased level of consciousness. 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  According to the nurse, she had been seen getting out of the bed using a walker to walk to the bathroom by herself when she was instructed not to at some point in the night and later on on another check, was nonresponsive to voice.  She only responded to sternal rub and responded with yes or no answers.  Also complained of chest pain.   Due to her recent stroke and multiple risk factors along with the sudden change in neurological status, code stroke was called. On my evaluation, the patient was coming around, open her eyes to noxious stimulation, on my noxious stimulation-responded by saying stop pinching me.  She also had what seemed like automatisms-chewing-like movements of her mouth. She was taken in for stat CT head that did not show any acute changes. Due to the recent stroke, not a candidate for IV thrombolysis. Exam improved within a few minutes, back to baseline as compared to the last note documented by the stroke service on 02/10/2020.  Unclear last known well.  During last hospitalization, had an EEG where an event was captured and deemed to be nonepileptic.  O:// Current  vital signs: BP (!) 182/98 (BP Location: Left Arm)   Pulse 67   Temp 98.7 F (37.1 C)   Resp 17   Ht 5\' 4"  (1.626 m)   SpO2 100%   BMI 32.79 kg/m  Vital signs in last 24 hours: Temp:  [97.3 F (36.3 C)-98.7 F (37.1 C)] 98.7 F (37.1 C) (08/28 2353) Pulse Rate:  [58-74] 67 (08/28 2353) Resp:  [16-18] 17 (08/28 2353) BP: (127-185)/(80-98) 182/98 (08/28 2353) SpO2:  [100 %] 100 % (08/28 2353) Initial exam: Drowsy, only responding to noxious stimulation by saying do not pinch me. Does not follow commands Pupils equal round reactive to light with eyes with possible downward gaze but no forced gaze deviation to the left or right. Not blinking to threat from either side. Left facial droop with extremely dysarthric speech. Not moving any of the 4 extremities but on noxious stimulation did have strong withdrawal of right upper extremity.  Initially did not move the right lower extremity but then spontaneously started moving it. To noxious stimulation did not move left lower extremity at all but was able to perceive pain.  Been minimal movement of the left upper extremity to noxious stim NIHSS 1a Level of Conscious.: 1 1b LOC Questions: 2 1c LOC Commands: 0 2 Best Gaze: 0 3 Visual: 0 4 Facial Palsy: 2 5a Motor Arm - left: 3 5b Motor Arm - Right: 1 6a Motor Leg - Left: 4 6b Motor Leg - Right: 1 7 Limb Ataxia: 0 8 Sensory: 1 9 Best Language: 1 10 Dysarthria: 2 11 Extinct. and Inatten.: 0 TOTAL: 18  Repeat exam as she was  being brought to the CT scanner: Awake, alert, telling me her name, got her age wrong. Exhibited automatisms like chewing-like movements  Speech moderate to severely dysarthric at times very difficult to comprehend. No gross aphasia Followed commands Cranial nerves: Pupils equal round reactive light, extraocular movements intact, visual fields full, significant left lower facial weakness. Motor exam: Able to move right upper and lower extremity to command  antigravity.  Moved left upper extremity some to command but not antigravity.  Left lower extremity nearly flaccid. Sensory exam: Unable to perceive touch on the right but not on the left.  Able to perceive noxious stimulation on the left arm and leg. Coordination: Difficult to perform  Medications  Current Facility-Administered Medications:  .   stroke: mapping our early stages of recovery book, , Does not apply, Once, Pahwani, Rinka R, MD .  acetaminophen (TYLENOL) tablet 650 mg, 650 mg, Oral, Q4H PRN, 650 mg at 02/11/20 2157 **OR** acetaminophen (TYLENOL) 160 MG/5ML solution 650 mg, 650 mg, Per Tube, Q4H PRN **OR** acetaminophen (TYLENOL) suppository 650 mg, 650 mg, Rectal, Q4H PRN, Pahwani, Rinka R, MD .  aspirin EC tablet 81 mg, 81 mg, Oral, Daily, Sheikh, Omair Latif, DO, 81 mg at 02/11/20 1045 .  atorvastatin (LIPITOR) tablet 80 mg, 80 mg, Oral, Daily, Pahwani, Rinka R, MD, 80 mg at 02/11/20 1047 .  bisacodyl (DULCOLAX) EC tablet 5 mg, 5 mg, Oral, Daily PRN, Opyd, Lavone Neri, MD .  clopidogrel (PLAVIX) tablet 75 mg, 75 mg, Oral, Daily, Sheikh, Omair Latif, DO, 75 mg at 02/11/20 1046 .  enoxaparin (LOVENOX) injection 40 mg, 40 mg, Subcutaneous, Q24H, Pahwani, Rinka R, MD, 40 mg at 02/11/20 1644 .  insulin aspart (novoLOG) injection 0-15 Units, 0-15 Units, Subcutaneous, TID WC, Pahwani, Rinka R, MD, 2 Units at 02/11/20 1643 .  insulin aspart (novoLOG) injection 0-5 Units, 0-5 Units, Subcutaneous, QHS, Pahwani, Rinka R, MD .  labetalol (NORMODYNE) injection 10 mg, 10 mg, Intravenous, Q2H PRN, Opyd, Timothy S, MD .  polyethylene glycol (MIRALAX / GLYCOLAX) packet 17 g, 17 g, Oral, Daily PRN, Opyd, Timothy S, MD .  sodium chloride flush (NS) 0.9 % injection 3 mL, 3 mL, Intravenous, Once, Terald Sleeper, MD Labs CBC    Component Value Date/Time   WBC 5.2 02/11/2020 0144   RBC 3.80 (L) 02/11/2020 0144   HGB 9.8 (L) 02/11/2020 0144   HCT 32.2 (L) 02/11/2020 0144   PLT 248 02/11/2020 0144    MCV 84.7 02/11/2020 0144   MCH 25.8 (L) 02/11/2020 0144   MCHC 30.4 02/11/2020 0144   RDW 19.9 (H) 02/11/2020 0144   LYMPHSABS 2.2 02/11/2020 0144   MONOABS 0.4 02/11/2020 0144   EOSABS 0.1 02/11/2020 0144   BASOSABS 0.0 02/11/2020 0144    CMP     Component Value Date/Time   NA 137 02/11/2020 0144   K 3.5 02/11/2020 0144   CL 106 02/11/2020 0144   CO2 24 02/11/2020 0144   GLUCOSE 135 (H) 02/11/2020 0144   BUN 12 02/11/2020 0144   CREATININE 0.87 02/11/2020 0144   CALCIUM 8.8 (L) 02/11/2020 0144   PROT 5.7 (L) 02/11/2020 0144   ALBUMIN 2.9 (L) 02/11/2020 0144   ALBUMIN 3.7 (L) 01/03/2020 1122   AST 12 (L) 02/11/2020 0144   ALT 12 02/11/2020 0144   ALKPHOS 57 02/11/2020 0144   BILITOT 0.3 02/11/2020 0144   GFRNONAA >60 02/11/2020 0144   GFRAA >60 02/11/2020 0144    glycosylated hemoglobin  Lipid Panel  Component Value Date/Time   CHOL 147 02/10/2020 0328   TRIG 101 02/10/2020 0328   HDL 52 02/10/2020 0328   CHOLHDL 2.8 02/10/2020 0328   VLDL 20 02/10/2020 0328   LDLCALC 75 02/10/2020 0328     Imaging I have reviewed images in epic and the results pertinent to this consultation are: Today's stat code stroke noncontrast head CT unremarkable for acute process. MRI with and without contrast on 02/09/2020 with acute right pontine infarct.  Evolution of previous abnormality centered within the right middle cerebellar peduncle, whose differentials were demyelination versus stroke likely stroke given risk factors, and other appearance of the brain.  Assessment: 44 year old woman with a history of hypertension, diabetes bipolar disorder admitted for a right pontine stroke with left-sided flaccidity, in July had right-sided weakness but left AMA at that time. Continues to have significant dysarthria and left-sided hemiplegia. Had an episode of unresponsiveness taking some time to come around, also complaining of chest pain during this time. Prior admission-an EEG  revealed a nonepileptic event concerning for pseudoseizures. She was exhibiting some automatisms in terms of chewing movements today, which is concerning for seizure activity. Although her stroke work-up has been completed, I would recommend obtaining a routine EEG in the morning.  Impression: -Episode of unresponsiveness in a patient with recent stroke. -Not a candidate for IV thrombolysis due to recent stroke -Exam not consistent with large vessel occlusion hence not a candidate for thrombectomy -Evaluate for underlying seizure-1 prior EEG had some sort of a clinical event which had no EEG correlate making a nonepileptic event likely but she has not had a full LTM study or any EMU study to prove so.   Recommendations: Continue dual antiplatelets as recommended by stroke evaluation earlier in the week. Therapy assessments as being done Would not use antiepileptics-has had episodes of unresponsiveness and strong psychiatric history make nonepileptic events likely but given the multiplicity of events, will do another routine EEG. If the routine EEG is abnormal, please call stroke neurology service back. Work-up for chest pain initiated by Dr. Gentry Fitz be followed by primary team. Continue counseling for smoking and cocaine cessation.  Stroke team will follow patient peripherally. -- Milon Dikes, MD [facility] Pager: [phone] If 7pm to 7am, please call on call as listed on [facility].   CRITICAL CARE ATTESTATION Performed by: Milon Dikes, MD Total critical care time: 54 minutes Critical care time was exclusive of separately billable procedures and treating other patients and/or supervising APPs/Residents/Students Critical care was necessary to treat or prevent imminent or life-threatening deterioration due to acute onset of unresponsiveness, emergent stroke evaluation, concern for seizure. This patient is critically ill and at significant risk for neurological worsening  and/or death and care requires constant monitoring. Critical care was time spent personally by me on the following activities: development of treatment plan with patient and/or surrogate as well as nursing, discussions with consultants, evaluation of patient's response to treatment, examination of patient, obtaining history from patient or surrogate, ordering and performing treatments and interventions, ordering and review of laboratory studies, ordering and review of radiographic studies, pulse oximetry, re-evaluation of patient's condition, participation in multidisciplinary rounds and medical decision making of high complexity in the care of this patient.

## 2020-02-12 NOTE — Significant Event (Addendum)
Patient was seen for chest pain and decreased LOC.   Kaitlyn Gray is a 39 yof with hx of bipolar disorder, HTN, HLD, polysusbstance abuse, and CVA in July 2021 w/ residual right-sided motor deficits who presented on 02/09/20 with with left-sided weakness, dysarthria, and facial numbness, was admitted by the hospitalist service with neurology consulting, and was found to have a right pontine infarct.    Tonight, she complained of chest pain. EKG, CXR, and cardiac enzymes were ordered and patient evaluated. Shortly after the chest pain complaint, she was noted by RN to become less responsive and no longer answering questions. She responded to sternal rub and resumed answering questions, reported new right-sided numbness and there was concern that the left sided weakness had also worsened.   Objectively, she is modestly hypertensive with normal RR, HR, and O2 sat. She is awake answering questions. There is no diaphoresis or pallor. Respirations unlabored. Chest pain is reproducible. Neuro exam notable for dysarthria, flaccid LUE and LLE. She reports diminished sensation involving extremities bilaterally.   Neurology was notified, responded to bedside immediately, evaluated the patient, and she is now going for CT head. Regarding the chest pain, it is reproducible and suspicion for ACS is low; will obtain EKG and enzymes to rule-out once the stat neuroimaging complete.
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
## 2020-02-12 NOTE — Progress Notes (Signed)
Pt back to baseline at this time. Will continue MEWS Q 2 monitoring.

## 2020-02-12 NOTE — Progress Notes (Signed)
EEG complete - results pending 

## 2020-02-12 NOTE — Consult Note (Signed)
[facility] Face-to-Face Psychiatry Consult   Reason for Consult:  ''Behavioral Disturbances, Agitation, Bipolar Disorder Patient bit the Nurse on the Hand. Throwing things across the room being belligerent.'' Referring Physician: Merlene Laughter, DO Patient Identification: Kaitlyn Gray MRN:  [id number] Principal Diagnosis: Ischemic stroke [facility]) Diagnosis:  Principal Problem:   Ischemic stroke (HCC) Active Problems:   Cocaine use disorder, severe, dependence (HCC)   Cannabis use disorder, moderate, dependence (HCC)   Diabetes mellitus without complication (HCC)   Hypertension   Normocytic anemia   Adjustment disorder with disturbance of conduct   Total Time spent with patient: 45 minutes  Subjective:   Kaitlyn Gray is a 43 y.o. female patient admitted for Stroke.  HPI:  Patient reports history of hypertension, hyperlipidemia, morbid obesity, tobacco abuse, noncompliance with medications, CVA in July/21 with residual right-sided weakness,  Bipolar depression, Cocaine and Cannabis use disorder. She was admitted to the hospital after she presented to the ER  with left-sided weakness, slurred speech and facial numbness. Psychiatric consult was called due to ''agitation, patient bit the Nurse on the hand''. Currently, patient is calm, cooperative and states that she bit the nurse in an attempt to free herself but regret her action. Patient reports that she was non-compliant with her Bipolar medications and has been self medicating with Cocaine. She states that she was receiving monthly Abilify Maintenna injection at ([facility] in [place]) but stopped few months ago. She is alert, oriented denies psychosis, delusions, self harming thoughts but reports being depressed due to her current medical condition. She is requesting for anti-depressant to help her deal with her symptoms.  Past Psychiatric History: as above  Risk to Self:  denies Risk to Others:  denies Prior Inpatient  Therapy:  [facility] Prior Outpatient Therapy:  [facility], [facility]  Past Medical History:  Past Medical History:  Diagnosis Date  . Bipolar 1 disorder (HCC)   . 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 Hypertension     Past Surgical History:  Procedure Laterality Date  . CESAREAN SECTION     Family History:  Family History  Problem Relation Age of Onset  . Hypertension Mother   . CAD Mother 58       died of MI at age 1  . Hypertension Father    Family Psychiatric  History:  Social History:  Social History   Substance and Sexual Activity  Alcohol Use No     Social History   Substance and Sexual Activity  Drug Use Yes  . Types: Marijuana, Cocaine    Social History   Socioeconomic History  . Marital status: Single    Spouse name: Not on file  . Number of children: Not on file  . Years of education: Not on file  . Highest education level: Not on file  Occupational History  . Not on file  Tobacco Use  . Smoking status: Current Every Day Smoker    Packs/day: 0.50    Types: Cigarettes  . Smokeless tobacco: Never Used  Vaping Use  . Vaping Use: Never used  Substance and Sexual Activity  . Alcohol use: No  . Drug use: Yes    Types: Marijuana, Cocaine  . Sexual activity: Not on file  Other Topics Concern  . Not on file  Social History Narrative  . Not on file   Social Determinants of Health   Financial Resource Strain:   .  Difficulty of Paying Living Expenses: Not on file  Food Insecurity:   . Worried About Programme researcher, broadcasting/film/video in the Last Year: Not on file  . Ran Out of Food in the Last Year: Not on file  Transportation Needs:   . Lack of Transportation (Medical): Not on file  . Lack of Transportation (Non-Medical): Not on file  Physical Activity:   . Days of Exercise per Week: Not on file  . Minutes of Exercise per Session: Not on file  Stress:   . Feeling of Stress : Not on file  Social  Connections:   . Frequency of Communication with Friends and Family: Not on file  . Frequency of Social Gatherings with Friends and Family: Not on file  . Attends Religious Services: Not on file  . Active Member of Clubs or Organizations: Not on file  . Attends Banker Meetings: Not on file  . Marital Status: Not on file   Additional Social History:    Allergies:   Allergies  Allergen Reactions  . Hydrocodone Itching  . Latex Rash    Labs:  Results for orders placed or performed during the hospital encounter of 02/08/20 (from the past 48 hour(s))  Glucose, capillary     Status: Abnormal   Collection Time: 02/10/20 11:53 AM  Result Value Ref Range   Glucose-Capillary 117 (H) 70 - 99 mg/dL    Comment: Glucose reference range applies only to samples taken after fasting for at least 8 hours.  ANA w/Reflex if Positive     Status: None   Collection Time: 02/10/20  3:00 PM  Result Value Ref Range   Anti Nuclear Antibody (ANA) Negative Negative    Comment: (NOTE) Performed At: [facility] [address] [place], [place] [id number] Jolene Schimke MD [id number]   Glucose, capillary     Status: Abnormal   Collection Time: 02/10/20  4:33 PM  Result Value Ref Range   Glucose-Capillary 188 (H) 70 - 99 mg/dL    Comment: Glucose reference range applies only to samples taken after fasting for at least 8 hours.  Glucose, capillary     Status: Abnormal   Collection Time: 02/10/20  8:31 PM  Result Value Ref Range   Glucose-Capillary 110 (H) 70 - 99 mg/dL    Comment: Glucose reference range applies only to samples taken after fasting for at least 8 hours.  CBC with Differential/Platelet     Status: Abnormal   Collection Time: 02/11/20  1:44 AM  Result Value Ref Range   WBC 5.2 4.0 - 10.5 K/uL   RBC 3.80 (L) 3.87 - 5.11 MIL/uL   Hemoglobin 9.8 (L) 12.0 - 15.0 g/dL   HCT 09.3 (L) 36 - 46 %   MCV 84.7 80.0 - 100.0 fL   MCH 25.8 (L) 26.0 - 34.0 pg   MCHC 30.4 30.0  - 36.0 g/dL   RDW 81.8 (H) 29.9 - 37.1 %   Platelets 248 150 - 400 K/uL   nRBC 0.0 0.0 - 0.2 %   Neutrophils Relative % 46 %   Neutro Abs 2.4 1.7 - 7.7 K/uL   Lymphocytes Relative 43 %   Lymphs Abs 2.2 0.7 - 4.0 K/uL   Monocytes Relative 7 %   Monocytes Absolute 0.4 0 - 1 K/uL   Eosinophils Relative 3 %   Eosinophils Absolute 0.1 0 - 0 K/uL   Basophils Relative 1 %   Basophils Absolute 0.0 0 - 0 K/uL   Immature Granulocytes  0 %   Abs Immature Granulocytes 0.01 0.00 - 0.07 K/uL    Comment: Performed at [facility], [address], [place], [place] [address]  Comprehensive metabolic panel     Status: Abnormal   Collection Time: 02/11/20  1:44 AM  Result Value Ref Range   Sodium 137 135 - 145 mmol/L   Potassium 3.5 3.5 - 5.1 mmol/L   Chloride 106 98 - 111 mmol/L   CO2 24 22 - 32 mmol/L   Glucose, Bld 135 (H) 70 - 99 mg/dL    Comment: Glucose reference range applies only to samples taken after fasting for at least 8 hours.   BUN 12 6 - 20 mg/dL   Creatinine, Ser 0.99 0.44 - 1.00 mg/dL   Calcium 8.8 (L) 8.9 - 10.3 mg/dL   Total Protein 5.7 (L) 6.5 - 8.1 g/dL   Albumin 2.9 (L) 3.5 - 5.0 g/dL   AST 12 (L) 15 - 41 U/L   ALT 12 0 - 44 U/L   Alkaline Phosphatase 57 38 - 126 U/L   Total Bilirubin 0.3 0.3 - 1.2 mg/dL   GFR calc non Af Amer >60 >60 mL/min   GFR calc Af Amer >60 >60 mL/min   Anion gap 7 5 - 15    Comment: Performed at [facility], [address] [place], [place] [address]  Magnesium     Status: Abnormal   Collection Time: 02/11/20  1:44 AM  Result Value Ref Range   Magnesium 1.6 (L) 1.7 - 2.4 mg/dL    Comment: Performed at [facility], [address] [place], [place] [address]  Phosphorus     Status: None   Collection Time: 02/11/20  1:44 AM  Result Value Ref Range   Phosphorus 3.3 2.5 - 4.6 mg/dL    Comment: Performed at [facility], [address] [place], [place] [address]  Glucose, capillary     Status: Abnormal   Collection  Time: 02/11/20  5:57 AM  Result Value Ref Range   Glucose-Capillary 137 (H) 70 - 99 mg/dL    Comment: Glucose reference range applies only to samples taken after fasting for at least 8 hours.  Glucose, capillary     Status: Abnormal   Collection Time: 02/11/20 12:21 PM  Result Value Ref Range   Glucose-Capillary 248 (H) 70 - 99 mg/dL    Comment: Glucose reference range applies only to samples taken after fasting for at least 8 hours.  Glucose, capillary     Status: Abnormal   Collection Time: 02/11/20  4:13 PM  Result Value Ref Range   Glucose-Capillary 149 (H) 70 - 99 mg/dL    Comment: Glucose reference range applies only to samples taken after fasting for at least 8 hours.  Glucose, capillary     Status: Abnormal   Collection Time: 02/11/20  8:30 PM  Result Value Ref Range   Glucose-Capillary 151 (H) 70 - 99 mg/dL    Comment: Glucose reference range applies only to samples taken after fasting for at least 8 hours.  Comprehensive metabolic panel     Status: Abnormal   Collection Time: 02/12/20  1:07 AM  Result Value Ref Range   Sodium 138 135 - 145 mmol/L   Potassium 3.8 3.5 - 5.1 mmol/L   Chloride 110 98 - 111 mmol/L   CO2 24 22 - 32 mmol/L   Glucose, Bld 169 (H) 70 - 99 mg/dL    Comment: Glucose  reference range applies only to samples taken after fasting for at least 8 hours.   BUN 15 6 - 20 mg/dL   Creatinine, Ser 8.54 0.44 - 1.00 mg/dL   Calcium 8.5 (L) 8.9 - 10.3 mg/dL   Total Protein 5.6 (L) 6.5 - 8.1 g/dL   Albumin 2.9 (L) 3.5 - 5.0 g/dL   AST 15 15 - 41 U/L   ALT 13 0 - 44 U/L   Alkaline Phosphatase 54 38 - 126 U/L   Total Bilirubin 0.4 0.3 - 1.2 mg/dL   GFR calc non Af Amer >60 >60 mL/min   GFR calc Af Amer >60 >60 mL/min   Anion gap 4 (L) 5 - 15    Comment: Performed at [facility], [address], [place], [place] [address]  CBC with Differential/Platelet     Status: Abnormal   Collection Time: 02/12/20  1:07 AM  Result Value Ref Range   WBC 4.8 4.0 -  10.5 K/uL   RBC 3.69 (L) 3.87 - 5.11 MIL/uL   Hemoglobin 9.5 (L) 12.0 - 15.0 g/dL   HCT 50.0 (L) 36 - 46 %   MCV 84.8 80.0 - 100.0 fL   MCH 25.7 (L) 26.0 - 34.0 pg   MCHC 30.4 30.0 - 36.0 g/dL   RDW 93.8 (H) 18.2 - 99.3 %   Platelets 240 150 - 400 K/uL   nRBC 0.0 0.0 - 0.2 %   Neutrophils Relative % 49 %   Neutro Abs 2.4 1.7 - 7.7 K/uL   Lymphocytes Relative 41 %   Lymphs Abs 2.0 0.7 - 4.0 K/uL   Monocytes Relative 6 %   Monocytes Absolute 0.3 0 - 1 K/uL   Eosinophils Relative 3 %   Eosinophils Absolute 0.1 0 - 0 K/uL   Basophils Relative 1 %   Basophils Absolute 0.0 0 - 0 K/uL   Immature Granulocytes 0 %   Abs Immature Granulocytes 0.01 0.00 - 0.07 K/uL    Comment: Performed at [facility], [address] [place], [place] [address]  Phosphorus     Status: None   Collection Time: 02/12/20  1:07 AM  Result Value Ref Range   Phosphorus 3.6 2.5 - 4.6 mg/dL    Comment: Performed at [facility], [address] [place], [place] [address]  Magnesium     Status: None   Collection Time: 02/12/20  1:07 AM  Result Value Ref Range   Magnesium 1.7 1.7 - 2.4 mg/dL    Comment: Performed at [facility], [address] [place], [place] [address]  Vitamin B12     Status: None   Collection Time: 02/12/20  1:07 AM  Result Value Ref Range   Vitamin B-12 237 180 - 914 pg/mL    Comment: (NOTE) This assay is not validated for testing neonatal or myeloproliferative syndrome specimens for Vitamin B12 levels. Performed at [facility], [address] [place], [place] [address]   Folate     Status: Abnormal   Collection Time: 02/12/20  1:07 AM  Result Value Ref Range   Folate 5.4 (L) >5.9 ng/mL    Comment: Performed at [facility], [address], [place], [place] [address]  Iron and TIBC     Status: None   Collection Time: 02/12/20  1:07 AM  Result Value Ref Range   Iron 50 28 - 170 ug/dL   TIBC 242 353 - 614 ug/dL  Saturation Ratios 15 10.4 - 31.8 %    UIBC 279 ug/dL    Comment: Performed at [facility], [address], [place], [place] [address]  Ferritin     Status: None   Collection Time: 02/12/20  1:07 AM  Result Value Ref Range   Ferritin 12 11 - 307 ng/mL    Comment: Performed at [facility], [address] [place], [place] [address]  Reticulocytes     Status: Abnormal   Collection Time: 02/12/20  1:07 AM  Result Value Ref Range   Retic Ct Pct 1.0 0.4 - 3.1 %   RBC. 3.74 (L) 3.87 - 5.11 MIL/uL   Retic Count, Absolute 36.7 19.0 - 186.0 K/uL   Immature Retic Fract 17.4 (H) 2.3 - 15.9 %    Comment: Performed at [facility], [address] [place], [place] [address]  Troponin I (High Sensitivity)     Status: None   Collection Time: 02/12/20  1:07 AM  Result Value Ref Range   Troponin I (High Sensitivity) 7 <18 ng/L    Comment: (NOTE) Elevated high sensitivity troponin I (hsTnI) values and significant  changes across serial measurements may suggest ACS but many other  chronic and acute conditions are known to elevate hsTnI results.  Refer to the "Links" section for chest pain algorithms and additional  guidance. Performed at [facility], [address], [place], [place] [address]   Troponin I (High Sensitivity)     Status: None   Collection Time: 02/12/20  5:57 AM  Result Value Ref Range   Troponin I (High Sensitivity) 8 <18 ng/L    Comment: (NOTE) Elevated high sensitivity troponin I (hsTnI) values and significant  changes across serial measurements may suggest ACS but many other  chronic and acute conditions are known to elevate hsTnI results.  Refer to the "Links" section for chest pain algorithms and additional  guidance. Performed at [facility], [address] [place], [place] [address]     Current Facility-Administered Medications  Medication Dose Route Frequency Provider Last Rate Last Admin  .  stroke: mapping our early stages of recovery book   Does not apply Once  Pahwani, Rinka R, MD      . acetaminophen (TYLENOL) tablet 650 mg  650 mg Oral Q4H PRN Pahwani, Rinka R, MD   650 mg at 02/11/20 2157   Or  . acetaminophen (TYLENOL) 160 MG/5ML solution 650 mg  650 mg Per Tube Q4H PRN Pahwani, Rinka R, MD       Or  . acetaminophen (TYLENOL) suppository 650 mg  650 mg Rectal Q4H PRN Pahwani, Rinka R, MD      . aspirin EC tablet 81 mg  81 mg Oral Daily Marguerita Merles Parral, DO   81 mg at 02/12/20 0957  . atorvastatin (LIPITOR) tablet 80 mg  80 mg Oral Daily Pahwani, Rinka R, MD   80 mg at 02/12/20 0957  . bisacodyl (DULCOLAX) EC tablet 5 mg  5 mg Oral Daily PRN Opyd, Lavone Neri, MD      . clopidogrel (PLAVIX) tablet 75 mg  75 mg Oral Daily Marguerita Merles Wilson, DO   75 mg at 02/12/20 0957  . enoxaparin (LOVENOX) injection 40 mg  40 mg Subcutaneous Q24H Pahwani, Rinka R, MD   40 mg at 02/11/20 1644  . insulin aspart (novoLOG) injection 0-15 Units  0-15 Units Subcutaneous TID WC Pahwani, Rinka R, MD   2  Units at 02/11/20 1643  . insulin aspart (novoLOG) injection 0-5 Units  0-5 Units Subcutaneous QHS Pahwani, Rinka R, MD      . labetalol (NORMODYNE) injection 10 mg  10 mg Intravenous Q2H PRN Opyd, Lavone Neri, MD      . polyethylene glycol (MIRALAX / GLYCOLAX) packet 17 g  17 g Oral Daily PRN Opyd, Lavone Neri, MD      . sodium chloride flush (NS) 0.9 % injection 3 mL  3 mL Intravenous Once Trifan, Kermit Balo, MD        Musculoskeletal: Strength & Muscle Tone: not tested Gait & Station: not tested Patient leans: N/A  Psychiatric Specialty Exam: Physical Exam Psychiatric:        Attention and Perception: Attention normal.        Mood and Affect: Mood normal.        Speech: Speech is delayed and slurred.        Behavior: Behavior normal. Behavior is cooperative.        Thought Content: Thought content normal.        Cognition and Memory: Cognition and memory normal.        Judgment: Judgment normal.     Review of Systems  Psychiatric/Behavioral: Negative.      Blood pressure (!) 182/96, pulse 72, temperature 98 F (36.7 C), temperature source Oral, resp. rate 18, height 5\' 4"  (1.626 m), SpO2 98 %.Body mass index is 32.79 kg/m.  General Appearance: Casual  Eye Contact:  Good  Speech:  Slow and Slurred  Volume:  Decreased  Mood:  Dysphoric  Affect:  Constricted  Thought Process:  Coherent  Orientation:  Full (Time, Place, and Person)  Thought Content:  Logical  Suicidal Thoughts:  No  Homicidal Thoughts:  No  Memory:  Immediate;   Fair Recent;   Fair Remote;   Good  Judgement:  Intact  Insight:  Fair  Psychomotor Activity:  Psychomotor Retardation  Concentration:  Concentration: Fair and Attention Span: Fair  Recall:  of Knowledge:  Fair  Language:  Fair  Akathisia:  No  Handed:  Right  AIMS (if indicated):     Assets:  Desire for Improvement  ADL's:  Marginal  Cognition:  WNL  Sleep:    fair     Treatment Plan Summary: 44 year old female with history of Bipolar depression, Cannabis/Cocaine use disorder who was admitted with Stroke symptoms most likely related to Cocaine use. Patient is alert, cooperate, denies self harming thoughts but endorsing depressive symptoms due to her medical issues.  Recommendations: -Consider starting patient on Prozac 10 mg daily for post stroke depression/aggression if Neurologist agree. - Consider social worker consult for evaluation of home environment and to refer patient back to [facility] in [place] upon discharge for psychiatric/substance abuse outpatient care. -Nursing staff to consider minimizing over stimulation in patient with stroke to reduce anger outburst.   Disposition: No evidence of imminent risk to self or others at present.   Patient does not meet criteria for psychiatric inpatient admission. Supportive therapy provided about ongoing stressors. Psychiatric service signing out. Re-consult as needed  [place], MD 02/12/2020 11:43 AM

## 2020-02-12 NOTE — Progress Notes (Signed)
Pt refusing troponin lab draw at this time. RN educated patient explaining it was to see if she had a heart attack. Patient stated, "I ain't had no fucking heart attack." RN attempted X 3 to educated patient on importance of this lab.

## 2020-02-12 NOTE — Procedures (Signed)
Patient Name: Kaitlyn Gray  MRN: [id number]  Epilepsy Attending: Charlsie Quest  Referring Physician/Provider: Dr Odie Sera Date: 02/12/2020 Duration: 23.31 mins  Patient history: 43 year old woman with a history of hypertension, diabetes bipolar disorder admitted for a right pontine stroke with left-sided flaccidity, in July had right-sided weakness but left AMA at that time. Continues to have significant dysarthria and left-sided hemiplegia. Had an episode of unresponsiveness taking some time to come around, also complaining of chest pain during this time. 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 She was exhibiting some automatisms in terms of chewing movements today, which is concerning for seizure activity. EEG to evaluate for seizure  Level of alertness: Awake  AEDs during EEG study: None  Technical aspects: This EEG study was done with scalp electrodes positioned according to the 10-20 International system of electrode placement. Electrical activity was acquired at a sampling rate of 500Hz  and reviewed with a high frequency filter of 70Hz  and a low frequency filter of 1Hz . EEG data were recorded continuously and digitally stored.   Description: The posterior dominant rhythm consists of 8-9 Hz activity of moderate voltage (25-35 uV) seen predominantly in posterior head regions, symmetric and reactive to eye opening and eye closing. Hyperventilation and photic stimulation were not performed.     IMPRESSION: This study is within normal limits. No seizures or epileptiform discharges were seen throughout the recording.  Ladd Cen 

## 2020-02-12 NOTE — Progress Notes (Signed)
Patient became upset when boyfriend was asked to leave due to too many visitors in room. While repositioning patient from side of bed back into bed patient intentionally bit the nurse right hand. Patient was reposition by charge nurse and bed alarm was reset to the on position.

## 2020-02-13 LAB — GLUCOSE, CAPILLARY
Glucose-Capillary: 129 mg/dL — ABNORMAL HIGH (ref 70–99)
Glucose-Capillary: 103 mg/dL — ABNORMAL HIGH (ref 70–99)
Glucose-Capillary: 155 mg/dL — ABNORMAL HIGH (ref 70–99)

## 2020-02-13 MED ORDER — HALOPERIDOL LACTATE 5 MG/ML IJ SOLN
2.0000 mg | Freq: Four times a day (QID) | INTRAMUSCULAR | Status: DC | PRN
  Administered 2020-02-14: 2 mg via INTRAVENOUS
  Filled 2020-02-13 (×2): qty 1

## 2020-02-13 NOTE — Progress Notes (Signed)
Patient agreeable to take scheduled medications. Patient refusing to allow this RN to complete a shift assessment. Patient stated, "Leave me alone."
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
## 2020-02-13 NOTE — TOC Progression Note (Addendum)
Transition of Care [facility]) - Progression Note    Patient Details  Name: Kaitlyn Gray MRN: [id number] Date of Birth: July 24, 1975  [facility] CM/SW Contact  Kaitlyn Friar, RN Phone Number: 02/13/2020, 1:04 PM  Clinical Narrative:    CM met with the patient. She asked that CM fax a note to the DA's office that she is admitted to the hospital. CM has done this. She also requested CM reach out to her lawyer so they are aware she is in the hospital. CM has also spoken to her lawyers assistant.  Pt requesting her boyfriend be able to visit. CM has updated the charge RN.  Pt requested CM call her boyfriend about him coming to visit. CM called the number she provided. The number went to boyfriends mother. She states she doesn't know where pts boyfriend is. Pt updated.  Pt only has family planning medicaid that wont cover any rehab. CM has sent an email to financial counseling to see if we can get a medicaid app/ disability started. CM has updated the patient. CM attempted to call her aunt, Kaitlyn Gray (with pt permission) and no answer and unable to leave voicemail.  TOC following.  1400: Financial counseling is saying pt has full medicaid. CM will update facility looking into her information.  Expected Discharge Plan: Skilled Nursing Facility Barriers to Discharge: SNF Pending bed offer  Expected Discharge Plan and Services Expected Discharge Plan: [place] In-house Referral: Clinical Social Work   Post Acute Care Choice: [place] Living arrangements for the past 2 months: Single Family Home                                       Social Determinants of Health (SDOH) Interventions    Readmission Risk Interventions No flowsheet data found.

## 2020-02-13 NOTE — Progress Notes (Signed)
OT Cancellation Note  Patient Details Name: Kaitlyn Gray MRN: [id number] DOB: 1976/03/20   Cancelled Treatment:    Reason Eval/Treat Not Completed: Other (comment) Per RN, pt is getting progressively more agitated and leaning towards combative, asking to hold at this time.   Pollyann Glen E. Quetzally Callas, COTA/L Acute Rehabilitation Services [phone]  Cherlyn Cushing 02/13/2020, 1:06 PM

## 2020-02-13 NOTE — Progress Notes (Signed)
PT Cancellation Note  Patient Details Name: CAYCE PASCHAL MRN: [id number] DOB: 1976-05-02   Cancelled Treatment:    Reason Eval/Treat Not Completed: Patient not medically ready  Spoke with patient's RN Kenyon Ana) and informed that pt has been getting more frustrated (not yet to agitation) as the day has gone on. She is fixated on her boyfriend, Alinda Money, being able to come visit her. RN recommended defer activity at this time with hopes of letting pt calm down.    Jerolyn Center, PT Pager [phone]   Zena Amos 02/13/2020, 1:49 PM

## 2020-02-13 NOTE — Progress Notes (Signed)
  Speech Language Pathology Treatment: Dysphagia;Cognitive-Linquistic  Patient Details Name: Kaitlyn Gray MRN: [id number] DOB: 07/10/1975 Today's Date: 02/13/2020 Time: 8875-7972 SLP Time Calculation (min) (ACUTE ONLY): 21 min  Assessment / Plan / Recommendation Clinical Impression  Pt on the edge of bed in small space between side rail and end of bed when therapist arrived. Pt crying, upset that boyfriend Alinda Money was told he had to leave the hospital over the weekend. Therapist able to calm pt and direct attention for brief instances to peaches and straw sips thin liquids. Pt with mild hypersalivation and oral pooling. No s/s aspiration with thin. Masticated peach but became distracted and unable to focus on po's. Speech is dysarthric and today marked by sound omissions with blends ("prite" instead of Sprite). Given visual and auditory cueing she was unable to imitate /sp/ blend.  Nursing reported she had called Alinda Money without reaching him. Pt able to able to recall information from hours prior (possibly even day prior) confirmed by 2 nurse techs. Pt left in bed, calm, sitting up drinking Sprite with bed alarm on and call bell at bedside.    HPI HPI: Kaitlyn Gray is a 44 y.o. female with history of stroke back in July 2021, hypertension, diabetes and bipolar 1 disorder.  Patient presented to the ED with left-sided weakness and facial numbness, onset 12 hours prior to arrival. MRI: did show an acute right pontine infarct and evolving right MCP stroke.      SLP Plan  Continue with current plan of care       Recommendations  Diet recommendations: Dysphagia 3 (mechanical soft);Thin liquid Liquids provided via: Cup;Straw Medication Administration: Whole meds with puree Supervision: Patient able to self feed;Staff to assist with self feeding;Full supervision/cueing for compensatory strategies Compensations: Minimize environmental distractions;Slow rate;Small sips/bites;Lingual sweep for  clearance of pocketing;Other (Comment);Clear throat intermittently Postural Changes and/or Swallow Maneuvers: Seated upright 90 degrees                Oral Care Recommendations: Oral care BID Follow up Recommendations: 24 hour supervision/assistance;Skilled Nursing facility SLP Visit Diagnosis: Dysphagia, unspecified (R13.10);Cognitive communication deficit (R41.841);Dysarthria and anarthria (R47.1) Plan: Continue with current plan of care                      Royce Macadamia 02/13/2020, 12:45 PM  Breck Coons Lonell Face.Ed Nurse, children's [phone] Office [phone]

## 2020-02-14 DIAGNOSIS — F4324 Adjustment disorder with disturbance of conduct: Secondary | ICD-10-CM

## 2020-02-14 LAB — GLUCOSE, CAPILLARY
Glucose-Capillary: 116 mg/dL — ABNORMAL HIGH (ref 70–99)
Glucose-Capillary: 130 mg/dL — ABNORMAL HIGH (ref 70–99)

## 2020-02-14 MED ORDER — QUETIAPINE FUMARATE 200 MG PO TABS: 200.0000 mg | ORAL_TABLET | Freq: Every day | ORAL | Status: DC

## 2020-02-14 NOTE — Progress Notes (Signed)
Physical Therapy Treatment Patient Details Name: Kaitlyn Gray MRN: [id number] DOB: Jun 30, 1975 Today's Date: 02/14/2020    History of Present Illness Kaitlyn Gray is a 44 y.o. female with history of stroke back in July 2021, hypertension, diabetes and bipolar 1 disorder.  Patient presented to the ED with left-sided weakness and facial numbness, onset 12 hours prior to arrival. MRI:MRI did show an acute right pontine infarct and evolving right MCP stroke.    PT Comments    Patient eager to walk and wants to go home. Impulsive with movements and required frequent cues to slow down and take her time. Loss of balance x 1 requiring assist to recover (when stepping backwards). Unsafe descent to sitting x 2 (not fully turning/backing up to surface, not reaching back to hold furniture with at least RUE, fast/uncontrolled descent). MD in to discuss discharge plan and pt now stating she has friends who can provide 24/7 assist and she would stay with them in 1st floor apartment (she reports her apartment is second floor and has fallen twice on the steps). MD to discuss with SW.     Follow Up Recommendations  SNF;Supervision/Assistance - 24 hour (if has 24/7 assist, first level apt, could dc home with HHPT)     Equipment Recommendations  Other (comment) (?tub bench--pt now reports she can't use BSC in tub)    Recommendations for Other Services       Precautions / Restrictions Precautions Precautions: Fall    Mobility  Bed Mobility Overal bed mobility: Needs Assistance Bed Mobility: Supine to Sit;Sit to Supine     Supine to sit: Min assist Sit to supine: Supervision   General bed mobility comments: Impulsively and quickly sat up to left EOB with loss of balance forward and to her right with assist to prevent fall;   Transfers Overall transfer level: Needs assistance Equipment used: Rolling walker (2 wheeled) Transfers: Sit to/from Stand Sit to Stand: Min assist Stand pivot  transfers: Min assist       General transfer comment: vc with each transfer (x 3) for safe hand placement and to stabilize RW as coming to stand; vc each time approaching surface to sit, requiring vc to slow down, fully back up to surface and to reach back to control descent  Ambulation/Gait Ambulation/Gait assistance: Min assist Gait Distance (Feet): 30 Feet (50, 120 seated rests between) Assistive device: Rolling walker (2 wheeled) Gait Pattern/deviations: Decreased stride length;Decreased step length - left;Decreased dorsiflexion - left;Step-to pattern;Step-through pattern;Steppage Gait velocity: Decreased   General Gait Details: Patient with better control and balance with step-to pattern leading with LLE; when she tries to move more quickly and progresses to step-through she has left knee partial buckling, imbalance, and left foot then drags as it lags behind   Psychologist, counselling mobility:  (pt reports she has w/c but can't self-propel; someone pushes)  Modified Rankin (Stroke Patients Only) Modified Rankin (Stroke Patients Only) Pre-Morbid Rankin Score: Moderately severe disability Modified Rankin: Moderately severe disability     Balance Overall balance assessment: Needs assistance Sitting-balance support: Feet supported Sitting balance-Leahy Scale: Fair Sitting balance - Comments: requires guarding for safety due to impulsivity   Standing balance support: Bilateral upper extremity supported;During functional activity Standing balance-Leahy Scale: Poor Standing balance comment: requires min assist for balance in static standing  Cognition Arousal/Alertness: Awake/alert Behavior During Therapy: Impulsive Overall Cognitive Status: History of cognitive impairments - at baseline Area of Impairment: Safety/judgement;Memory;Attention;Awareness;Problem solving                    Current Attention Level: Sustained Memory: Decreased recall of precautions;Decreased short-term memory Following Commands: Follows one step commands with increased time Safety/Judgement: Decreased awareness of safety Awareness: Intellectual Problem Solving: Difficulty sequencing;Requires verbal cues;Requires tactile cues General Comments: Cognitive deficits at baseline. Required safety cues throughout--especially to slow down       Exercises      General Comments General comments (skin integrity, edema, etc.): MD in during session to discuss dc plan; see updates re: home set-up (based on going to a friend's home) and equipment she owns       Pertinent Vitals/Pain Pain Assessment: No/denies pain    Home Living Family/patient expects to be discharged to:: Private residence Living Arrangements: Non-relatives/Friends (plans to go to [facility] apt or [facility] Available Help at Discharge: Available 24 hours/day;Friend(s) (pt reports 24/7 with SW to follow-up) Type of Home: Apartment Home Access: Stairs to enter   Home Layout: One level Home Equipment: Environmental consultant - 2 wheels;Bedside commode;Wheelchair - manual Additional Comments: 8/31 states she has above equipment    Prior Function            PT Goals (current goals can now be found in the care plan section) Acute Rehab PT Goals Patient Stated Goal: none stated Time For Goal Achievement: 02/23/20 Potential to Achieve Goals: Fair Progress towards PT goals: Progressing toward goals    Frequency    Min 3X/week      PT Plan Current plan remains appropriate (if has 24/7 assist at home, could dc home with HHPT)    Co-evaluation PT/OT/SLP Co-Evaluation/Treatment: Yes Reason for Co-Treatment: For patient/therapist safety (due to behavior issues)          AM-PAC PT "6 Clicks" Mobility   Outcome Measure  Help needed turning from your back to your side while in a flat bed without using bedrails?: None Help  needed moving from lying on your back to sitting on the side of a flat bed without using bedrails?: A Little Help needed moving to and from a bed to a chair (including a wheelchair)?: A Little Help needed standing up from a chair using your arms (e.g., wheelchair or bedside chair)?: A Little Help needed to walk in hospital room?: A Little Help needed climbing 3-5 steps with a railing? : A Lot 6 Click Score: 18    End of Session Equipment Utilized During Treatment: Gait belt Activity Tolerance: Patient tolerated treatment well Patient left: in bed;with call bell/phone within reach;with nursing/sitter in room Nurse Communication: Mobility status PT Visit Diagnosis: Other abnormalities of gait and mobility (R26.89);History of falling (Z91.81);Repeated falls (R29.6);Muscle weakness (generalized) (M62.81);Hemiplegia and hemiparesis Hemiplegia - Right/Left: Left Hemiplegia - dominant/non-dominant: Non-dominant Hemiplegia - caused by: Cerebral infarction     Time: 1117-1200 PT Time Calculation (min) (ACUTE ONLY): 43 min  Charges:  $Gait Training: 23-37 mins                      Jerolyn Center, PT Pager [phone]    Zena Amos 02/14/2020, 12:18 PM

## 2020-02-14 NOTE — Consult Note (Signed)
[facility] Face-to-Face Psychiatry Consult   Reason for Consult:  ''Behavioral Disturbances, Agitation, Bipolar Disorder Patient bit the Nurse on the Hand. Throwing things across the room being belligerent.'' Referring Physician: Merlene Laughter, DO Patient Identification: Kaitlyn Gray MRN:  [id number] Principal Diagnosis: Ischemic stroke [facility]) Diagnosis:  Principal Problem:   Ischemic stroke (HCC) Active Problems:   Cocaine use disorder, severe, dependence (HCC)   Cannabis use disorder, moderate, dependence (HCC)   Diabetes mellitus without complication (HCC)   Hypertension   Normocytic anemia   Adjustment disorder with disturbance of conduct   Total Time spent with patient: 45 minutes  Subjective:   Kaitlyn Gray is a 44 y.o. female patient admitted for Stroke. Psych consult placed for behavioral disturbance, throwing herself in the floor, biting a nurse on the hand, and capacity. This practitioner contacted Dr. Marland Mcalpine to obtain more information about what specific components of capacity were we assessing for. Patient denies all allegations above,despite documentation. As per patient she is doing well, and has not been displaying any disruptive behaviors. She is able to provide the name of her home address, who resides in the home and how long she has live there. When assessing for capacity she states "my sister takes care of people, and knows what she is doing. She comes by the house everyday to check on me. My mother also can help. [name] (roommate) can help me. He cooks. " When assessing for mobility and assistance with ADLS she states " my biggest problem is I need help with walking. I can feed myself and bath myself just fine. " She reports compliance with her medications at this time. She denies any suicidal thoughts.   HPI:  Patient reports history of hypertension, hyperlipidemia, morbid obesity, tobacco abuse, noncompliance with medications, CVA in July/21 with residual  right-sided weakness,  Bipolar depression, Cocaine and Cannabis use disorder. She was admitted to the hospital after she presented to the ER  with left-sided weakness, slurred speech and facial numbness. Psychiatric consult was called due to ''agitation, patient bit the Nurse on the hand''. Currently, patient is calm, cooperative and states that she bit the nurse in an attempt to free herself but regret her action. Patient reports that she was non-compliant with her Bipolar medications and has been self medicating with Cocaine. She states that she was receiving monthly Abilify Maintenna injection at ([facility] in [place]) but stopped few months ago. She is alert, oriented denies psychosis, delusions, self harming thoughts but reports being depressed due to her current medical condition. She is requesting for anti-depressant to help her deal with her symptoms.  Updated:   The night before last she had worsening strokelike symptoms and became unresponsive and had chest pain.  Chest pain is now resolved and neurology was reconsulted overnight and she went for a stat head CT scan and went for an EEG this morning.  Neurology also recommended continuing dual antiplatelet therapy and felt that antiepileptics were not indicated at this point.  Routine EEG was normal.  This morning she became extremely upset when she is more awake and her boyfriend was asked to leave and she became extremely agitated and subsequently started throwing things and bit one of the nurses on his hand.  Psychiatry was consulted who recommended 10 mg of fluoxetine but felt that she was not a danger and signed off.  02/13/20 Patient continue to plan to leave AMA and called her sister to come pick her up.  She  became extremely agitated and tried to dorsal from the floor.  Haldol was started as well as a Recruitment consultant and psychiatry was reconsulted given her behavioral disturbances.  Subsequently patient did not leave AMA as her  family convinced her not to.    Past Psychiatric History: Bipolar 1 disorder and polysubstance abuse. She is taking Seroquel 200mg  po daily for symptoms management.   Risk to Self:  denies Risk to Others:  denies Prior Inpatient Therapy:  [facility] Prior Outpatient Therapy:  [facility], [facility]  Past Medical History:  Past Medical History:  Diagnosis Date  . Bipolar 1 disorder (HCC)   . 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 Hypertension     Past Surgical History:  Procedure Laterality Date  . CESAREAN SECTION     Family History:  Family History  Problem Relation Age of Onset  . Hypertension Mother   . CAD Mother 69       died of MI at age 53  . Hypertension Father    Family Psychiatric  History:  Social History:  Social History   Substance and Sexual Activity  Alcohol Use No     Social History   Substance and Sexual Activity  Drug Use Yes  . Types: Marijuana, Cocaine    Social History   Socioeconomic History  . Marital status: Single    Spouse name: Not on file  . Number of children: Not on file  . Years of education: Not on file  . Highest education level: Not on file  Occupational History  . Not on file  Tobacco Use  . Smoking status: Current Every Day Smoker    Packs/day: 0.50    Types: Cigarettes  . Smokeless tobacco: Never Used  Vaping Use  . Vaping Use: Never used  Substance and Sexual Activity  . Alcohol use: No  . Drug use: Yes    Types: Marijuana, Cocaine  . Sexual activity: Not on file  Other Topics Concern  . Not on file  Social History Narrative  . Not on file   Social Determinants of Health   Financial Resource Strain:   . Difficulty of Paying Living Expenses: Not on file  Food Insecurity:   . Worried About 59 in the Last Year: Not on file  . Ran Out of Food in the Last Year: Not on file  Transportation Needs:   . Lack of Transportation (Medical): Not  on file  . Lack of Transportation (Non-Medical): Not on file  Physical Activity:   . Days of Exercise per Week: Not on file  . Minutes of Exercise per Session: Not on file  Stress:   . Feeling of Stress : Not on file  Social Connections:   . Frequency of Communication with Friends and Family: Not on file  . Frequency of Social Gatherings with Friends and Family: Not on file  . Attends Religious Services: Not on file  . Active Member of Clubs or Organizations: Not on file  . Attends Programme researcher, broadcasting/film/video Meetings: Not on file  . Marital Status: Not on file   Additional Social History:    Allergies:   Allergies  Allergen Reactions  . Hydrocodone Itching  . Latex Rash    Labs:  Results for orders placed or performed during the hospital encounter of 02/08/20 (from the past 48 hour(s))  Glucose, capillary     Status: Abnormal   Collection Time: 02/12/20  9:04 PM  Result Value Ref Range   Glucose-Capillary 125 (H) 70 - 99 mg/dL    Comment: Glucose reference range applies only to samples taken after fasting for at least 8 hours.  Glucose, capillary     Status: Abnormal   Collection Time: 02/13/20  6:08 AM  Result Value Ref Range   Glucose-Capillary 129 (H) 70 - 99 mg/dL    Comment: Glucose reference range applies only to samples taken after fasting for at least 8 hours.  Glucose, capillary     Status: Abnormal   Collection Time: 02/13/20  4:09 PM  Result Value Ref Range   Glucose-Capillary 103 (H) 70 - 99 mg/dL    Comment: Glucose reference range applies only to samples taken after fasting for at least 8 hours.  Glucose, capillary     Status: Abnormal   Collection Time: 02/13/20  9:07 PM  Result Value Ref Range   Glucose-Capillary 155 (H) 70 - 99 mg/dL    Comment: Glucose reference range applies only to samples taken after fasting for at least 8 hours.   Comment 1 Notify RN    Comment 2 Document in Chart   Glucose, capillary     Status: Abnormal   Collection Time: 02/14/20   6:10 AM  Result Value Ref Range   Glucose-Capillary 116 (H) 70 - 99 mg/dL    Comment: Glucose reference range applies only to samples taken after fasting for at least 8 hours.   Comment 1 Notify RN    Comment 2 Document in Chart     Current Facility-Administered Medications  Medication Dose Route Frequency Provider Last Rate Last Admin  .  stroke: mapping our early stages of recovery book   Does not apply Once Pahwani, Rinka R, MD      . acetaminophen (TYLENOL) tablet 650 mg  650 mg Oral Q4H PRN Pahwani, Rinka R, MD   650 mg at 02/12/20 2038   Or  . acetaminophen (TYLENOL) 160 MG/5ML solution 650 mg  650 mg Per Tube Q4H PRN Pahwani, Rinka R, MD       Or  . acetaminophen (TYLENOL) suppository 650 mg  650 mg Rectal Q4H PRN Pahwani, Rinka R, MD      . aspirin EC tablet 81 mg  81 mg Oral Daily Marguerita Merles [place], DO   81 mg at 02/14/20 0850  . atorvastatin (LIPITOR) tablet 80 mg  80 mg Oral Daily Pahwani, Rinka R, MD   80 mg at 02/14/20 0850  . bisacodyl (DULCOLAX) EC tablet 5 mg  5 mg Oral Daily PRN Opyd, Lavone Neri, MD      . clopidogrel (PLAVIX) tablet 75 mg  75 mg Oral Daily Marguerita Merles [place], DO   75 mg at 02/14/20 0850  . enoxaparin (LOVENOX) injection 40 mg  40 mg Subcutaneous Q24H Pahwani, Rinka R, MD   40 mg at 02/11/20 1644  . FLUoxetine (PROZAC) capsule 10 mg  10 mg Oral Daily Marguerita Merles [place], DO   10 mg at 02/14/20 0849  . folic acid (FOLVITE) tablet 1 mg  1 mg Oral Daily Marguerita Merles [place], DO   1 mg at 02/14/20 1610  . haloperidol lactate (HALDOL) injection 2 mg  2 mg Intravenous Q6H PRN Marguerita Merles [place], DO   2 mg at 02/14/20 9604  . insulin aspart (novoLOG) injection 0-15 Units  0-15 Units Subcutaneous TID WC Pahwani, Rinka R, MD   2 Units at 02/11/20 1643  . insulin aspart (novoLOG) injection 0-5  Units  0-5 Units Subcutaneous QHS Pahwani, Rinka R, MD      . labetalol (NORMODYNE) injection 10 mg  10 mg Intravenous Q2H PRN Opyd, Lavone Neri, MD      . polyethylene glycol  (MIRALAX / GLYCOLAX) packet 17 g  17 g Oral Daily PRN Opyd, Lavone Neri, MD      . QUEtiapine (SEROQUEL) tablet 200 mg  200 mg Oral QHS Starkes-Perry, Kalany Diekmann S, FNP      . sodium chloride flush (NS) 0.9 % injection 3 mL  3 mL Intravenous Once Trifan, Kermit Balo, MD        Musculoskeletal: Strength & Muscle Tone: not tested Gait & Station: not tested Patient leans: N/A  Psychiatric Specialty Exam: Physical Exam Psychiatric:        Attention and Perception: Attention normal.        Mood and Affect: Mood normal.        Speech: Speech is delayed and slurred.        Behavior: Behavior normal. Behavior is cooperative.        Thought Content: Thought content normal.        Cognition and Memory: Cognition and memory normal.        Judgment: Judgment normal.     Review of Systems  Psychiatric/Behavioral: Negative.     Blood pressure (!) 145/74, pulse 60, temperature 98.6 F (37 C), temperature source Oral, resp. rate 18, height 5\' 4"  (1.626 m), SpO2 100 %.Body mass index is 32.79 kg/m.  General Appearance: Casual  Eye Contact:  Good  Speech:  Slow and Slurred 2/t cva  Volume:  Decreased  Mood:  Dysphoric  Affect:  Constricted  Thought Process:  Coherent  Orientation:  Full (Time, Place, and Person)  Thought Content:  Logical  Suicidal Thoughts:  No  Homicidal Thoughts:  No  Memory:  Immediate;   Fair Recent;   Fair Remote;   Good  Judgement:  Intact  Insight:  Fair  Psychomotor Activity:  Psychomotor Retardation  Concentration:  Concentration: Fair and Attention Span: Fair  Recall:  [employer] of Knowledge:  Fair  Language:  Fair  Akathisia:  No  Handed:  Right  AIMS (if indicated):     Assets:  Desire for Improvement  ADL's:  Marginal  Cognition:  WNL  Sleep:    fair     Treatment Plan Summary: 44 year old female with history of Bipolar depression, Cannabis/Cocaine use disorder who was admitted with Stroke symptoms most likely related to Cocaine use. Patient is alert,  cooperate, denies self harming thoughts but endorsing depressive symptoms due to her medical issues.  Recommendations: -Started patient on home medications of Seroquel 200mg  po daily to help with mood stabilization.  -Patient with post CVA agitation and aggression, may benefit from antidepressant Prozac or Citalopram.  -Will recommend she follow up with outpatient psychiatrist, and ensure they are able to continue following her even if it is virtual.  -Patient does appear to have the capacity to make her decision to go home. It is with reservation that she stay in the hospital to complete her rehabilitation.  - Consider social worker consult for evaluation of home health services if she continues to desire to go home.  -Nursing staff to consider minimizing over stimulation in patient with stroke to reduce anger outburst.   Disposition: No evidence of imminent risk to self or others at present.   Patient does not meet criteria for psychiatric inpatient admission. Supportive therapy provided  about ongoing stressors. Psychiatric service signing out. Re-consult as needed  Maryagnes Amos, FNP 02/14/2020 10:41 AM
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
## 2020-02-14 NOTE — TOC Transition Note (Addendum)
Transition of Care [facility]) - CM/SW Discharge Note   Patient Details  Name: Kaitlyn Gray MRN: [id number] Date of Birth: [birth year]  Transition of Care [facility] CM/SW Contact:  Kermit Balo, RN Phone Number: 02/14/2020, 4:33 PM   Clinical Narrative:    Pt is choosing to leave the hospital AMA. Recommendations are for SNF rehab. She has signed the paperwork and has her belongings packed. CM asked CSW to reach out to her aunt and see if she could convince her to stay. CM attempted to convince her to stay for rehab post stroke and that she wont have the support she needs at home. Pt continues to leave AMA Pt doesn't have 24 hour care at home and CM attempted to have her friend, [name] provide transport home and supervision at home but he is refusing.  Pt has tub bench at the bedside for home. [employer] delivered the DME to the room. Per management cab voucher provided for transport home.    Final next level of care: Against Medical Advice Barriers to Discharge: SNF Pending bed offer   Patient Goals and CMS Choice   CMS Medicare.gov Compare Post Acute Care list provided to:: Patient Represenative (must comment) Victorino Dike, aunt) Choice offered to / list presented to :  Midwife)  Discharge Placement                       Discharge Plan and Services In-house Referral: Clinical Social Work   Post Acute Care Choice: Skilled Nursing Facility          DME Arranged: Tub bench DME Agency: [employer] Date DME Agency Contacted: 02/14/20   Representative spoke with at DME Agency: [employer]            Social Determinants of Health (SDOH) Interventions     Readmission Risk Interventions No flowsheet data found.

## 2020-02-14 NOTE — Progress Notes (Signed)
Occupational Therapy Treatment Patient Details Name: Kaitlyn Gray MRN: [id number] DOB: [birth year] Today's Date: 02/14/2020    History of present illness Kaitlyn Gray is a 44 y.o. female with history of stroke back in July 2021, hypertension, diabetes and bipolar 1 disorder.  Patient presented to the ED with left-sided weakness and facial numbness, onset 12 hours prior to arrival. MRI:MRI did show an acute right pontine infarct and evolving right MCP stroke.   OT comments  Patient supine in bed and agreeable to OT/PT session today.  Patient requires min assist for bed mobility for safety, impulsively transitioning to EOB towards L side.  Completing transfers with min assist using RW, grooming at sink with min assist given increased time and support required for bimanual tasks due to L hand weakness, 1 LOB stepping backwards at sink.  Requires increased time for L hand management on RW, but mobility with min assist in room.  MD in room to educate on need for 24/7 support, in which patient reports she could stay with a friend on a 1 level home. Patient engaged in L UE exercises x 10 reps 1 set shoulder flexion, elbow flexion, x 5 reps hand flexion/extension--requires max cueing to fully engage and attend to task.  Will follow acutely.    Follow Up Recommendations  SNF;Supervision/Assistance - 24 hour (HHOT if declines )    Equipment Recommendations  None recommended by OT    Recommendations for Other Services      Precautions / Restrictions Precautions Precautions: Fall Restrictions Weight Bearing Restrictions: No       Mobility Bed Mobility Overal bed mobility: Needs Assistance Bed Mobility: Supine to Sit     Supine to sit: Min assist Sit to supine: Supervision   General bed mobility comments: Impulsively and quickly sat up to left EOB with loss of balance forward and to her right with assist to prevent fall;   Transfers Overall transfer level: Needs  assistance Equipment used: Rolling walker (2 wheeled) Transfers: Sit to/from Stand Sit to Stand: Min assist Stand pivot transfers: Min assist       General transfer comment: min assist to power up and steady with cueing for hand placement and safety; poor carryover of technique and remains impulsive     Balance Overall balance assessment: Needs assistance Sitting-balance support: Feet supported Sitting balance-Leahy Scale: Fair Sitting balance - Comments: requires guarding for safety due to impulsivity   Standing balance support: Bilateral upper extremity supported;No upper extremity supported;During functional activity Standing balance-Leahy Scale: Poor Standing balance comment: requires min assist for balance in static standing                           ADL either performed or assessed with clinical judgement   ADL Overall ADL's : Needs assistance/impaired     Grooming: Minimal assistance;Standing Grooming Details (indicate cue type and reason): standing at sink to complete washing hands and oral care, min assist for bimanual task mgmt and balance with cueing to utilize L hand as much as possible              Lower Body Dressing: Maximal assistance;Sit to/from stand Lower Body Dressing Details (indicate cue type and reason): for clothing mgmt in standing, able to adjust socks in sitting but due to L hand weakness unable to fully manage, min assist sit to stand  Toilet Transfer: Minimal assistance;Ambulation;RW Toilet Transfer Details (indicate cue type and reason): simulated to recliner  Functional mobility during ADLs: Minimal assistance;Rolling walker;Cueing for safety;Cueing for sequencing General ADL Comments: pt limited by decreased strength/coordination of L side, impaired balance and decreased cognition      Vision   Additional Comments: pt denies visual loss, able to read clock and locate items at sink with increased time    Perception      Praxis      Cognition Arousal/Alertness: Awake/alert Behavior During Therapy: Impulsive Overall Cognitive Status: History of cognitive impairments - at baseline Area of Impairment: Safety/judgement;Memory;Attention;Awareness;Problem solving                   Current Attention Level: Sustained Memory: Decreased recall of precautions;Decreased short-term memory Following Commands: Follows one step commands with increased time Safety/Judgement: Decreased awareness of safety Awareness: Intellectual Problem Solving: Difficulty sequencing;Requires verbal cues;Requires tactile cues;Slow processing;Decreased initiation General Comments: cognitive deficits at baseline, poor awareness to deficits and need for 24/7 support; requires cueing for pacing         Exercises     Shoulder Instructions       General Comments MD in during session to discuss dc plan; see updates re: home set-up (based on going to a friend's home) and equipment she owns    Pertinent Vitals/ Pain       Pain Assessment: No/denies pain  Home Living Family/patient expects to be discharged to:: Private residence Living Arrangements: Non-relatives/Friends (plans to go to [employer] apt or [facility] Available Help at Discharge: Available 24 hours/day;Friend(s) (pt reports 24/7 with SW to follow-up) Type of Home: Apartment Home Access: Stairs to enter [employer] of Steps: 1   Home Layout: One level     Bathroom Shower/Tub: Tub/shower unit;Curtain         Home Equipment: Environmental consultant - 2 wheels;Bedside commode;Wheelchair - manual   Additional Comments: 8/31 states she has above equipment      Prior Functioning/Environment              Frequency  Min 2X/week        Progress Toward Goals  OT Goals(current goals can now be found in the care plan section)  Progress towards OT goals: Progressing toward goals  Acute Rehab OT Goals Patient Stated Goal: to get home  OT Goal  Formulation: With patient  Plan Discharge plan remains appropriate;Frequency remains appropriate    Co-evaluation    PT/OT/SLP Co-Evaluation/Treatment: Yes Reason for Co-Treatment: For patient/therapist safety (due to behavior issues)   OT goals addressed during session: ADL's and self-care      AM-PAC OT "6 Clicks" Daily Activity     Outcome Measure   Help from another person eating meals?: A Little Help from another person taking care of personal grooming?: A Little Help from another person toileting, which includes using toliet, bedpan, or urinal?: A Lot Help from another person bathing (including washing, rinsing, drying)?: A Lot Help from another person to put on and taking off regular upper body clothing?: A Lot Help from another person to put on and taking off regular lower body clothing?: A Lot 6 Click Score: 14    End of Session Equipment Utilized During Treatment: Gait belt;Rolling walker  OT Visit Diagnosis: Unsteadiness on feet (R26.81);Other abnormalities of gait and mobility (R26.89);Repeated falls (R29.6);History of falling (Z91.81);Muscle weakness (generalized) (M62.81);Low vision, both eyes (H54.2);Other symptoms and signs involving cognitive function;Pain;Hemiplegia and hemiparesis Hemiplegia - Right/Left: Left Hemiplegia - dominant/non-dominant: Non-Dominant Hemiplegia - caused by: Cerebral infarction   Activity Tolerance Patient tolerated treatment well  Patient Left in chair;with call bell/phone within reach;Other (comment) (with PT )   Nurse Communication Mobility status        Time: 4970-2637 OT Time Calculation (min): 26 min  Charges: OT General Charges $OT Visit: 1 Visit OT Treatments $Self Care/Home Management : 8-22 mins  Barry Brunner, OT Acute Rehabilitation Services Pager [phone] Office [phone]    Chancy Milroy 02/14/2020, 12:44 PM

## 2020-03-14 ENCOUNTER — Emergency Department (HOSPITAL_COMMUNITY)
Admission: EM | Admit: 2020-03-14 | Discharge: 2020-03-15 | Disposition: A | Discharge status: Home / Self Care | Payer: Medicaid Other | Attending: Emergency Medicine | Admitting: Emergency Medicine

## 2020-03-14 ENCOUNTER — Ambulatory Visit (HOSPITAL_COMMUNITY)
Admission: EM | Admit: 2020-03-14 | Discharge: 2020-03-14 | Disposition: A | Discharge status: Home / Self Care | Payer: Medicaid Other

## 2020-03-14 ENCOUNTER — Other Ambulatory Visit: Payer: Self-pay

## 2020-03-14 ENCOUNTER — Encounter (HOSPITAL_COMMUNITY): Payer: Self-pay | Admitting: Emergency Medicine

## 2020-03-14 ENCOUNTER — Emergency Department (HOSPITAL_COMMUNITY): Payer: Medicaid Other

## 2020-03-14 DIAGNOSIS — G8194 Hemiplegia, unspecified affecting left nondominant side: Secondary | ICD-10-CM

## 2020-03-14 DIAGNOSIS — R519 Headache, unspecified: Secondary | ICD-10-CM | POA: Diagnosis not present

## 2020-03-14 DIAGNOSIS — E1100 Type 2 diabetes mellitus with hyperosmolarity without nonketotic hyperglycemic-hyperosmolar coma (NKHHC): Secondary | ICD-10-CM | POA: Insufficient documentation

## 2020-03-14 DIAGNOSIS — Z91040 Latex allergy status: Secondary | ICD-10-CM | POA: Insufficient documentation

## 2020-03-14 DIAGNOSIS — Z794 Long term (current) use of insulin: Secondary | ICD-10-CM | POA: Insufficient documentation

## 2020-03-14 DIAGNOSIS — R471 Dysarthria and anarthria: Secondary | ICD-10-CM | POA: Insufficient documentation

## 2020-03-14 DIAGNOSIS — Z79899 Other long term (current) drug therapy: Secondary | ICD-10-CM | POA: Insufficient documentation

## 2020-03-14 DIAGNOSIS — F17210 Nicotine dependence, cigarettes, uncomplicated: Secondary | ICD-10-CM | POA: Insufficient documentation

## 2020-03-14 DIAGNOSIS — I10 Essential (primary) hypertension: Secondary | ICD-10-CM | POA: Insufficient documentation

## 2020-03-14 DIAGNOSIS — R531 Weakness: Secondary | ICD-10-CM

## 2020-03-14 LAB — CBC
HCT: 31.5 % — ABNORMAL LOW (ref 36.0–46.0)
Hemoglobin: 9.4 g/dL — ABNORMAL LOW (ref 12.0–15.0)
MCH: 24.7 pg — ABNORMAL LOW (ref 26.0–34.0)
MCHC: 29.8 g/dL — ABNORMAL LOW (ref 30.0–36.0)
MCV: 82.7 fL (ref 80.0–100.0)
Platelets: 287 10*3/uL (ref 150–400)
RBC: 3.81 MIL/uL — ABNORMAL LOW (ref 3.87–5.11)
RDW: 18.4 % — ABNORMAL HIGH (ref 11.5–15.5)
WBC: 4.3 10*3/uL (ref 4.0–10.5)
nRBC: 0 % (ref 0.0–0.2)

## 2020-03-14 LAB — COMPREHENSIVE METABOLIC PANEL
ALT: 13 U/L (ref 0–44)
AST: 15 U/L (ref 15–41)
Albumin: 3.3 g/dL — ABNORMAL LOW (ref 3.5–5.0)
Alkaline Phosphatase: 60 U/L (ref 38–126)
Anion gap: 7 (ref 5–15)
BUN: 16 mg/dL (ref 6–20)
CO2: 25 mmol/L (ref 22–32)
Calcium: 8.9 mg/dL (ref 8.9–10.3)
Chloride: 108 mmol/L (ref 98–111)
Creatinine, Ser: 0.88 mg/dL (ref 0.44–1.00)
GFR calc Af Amer: >60 mL/min (ref >60)
GFR calc non Af Amer: >60 mL/min (ref >60)
Glucose, Bld: 145 mg/dL — ABNORMAL HIGH (ref 70–99)
Potassium: 3.8 mmol/L (ref 3.5–5.1)
Sodium: 140 mmol/L (ref 135–145)
Total Bilirubin: 0.7 mg/dL (ref 0.3–1.2)
Total Protein: 6.4 g/dL — ABNORMAL LOW (ref 6.5–8.1)

## 2020-03-14 LAB — PROTIME-INR
INR: 1 (ref 0.8–1.2)
Prothrombin Time: 12.6 seconds (ref 11.4–15.2)

## 2020-03-14 LAB — DIFFERENTIAL
Abs Immature Granulocytes: 0.01 10*3/uL (ref 0.00–0.07)
Basophils Absolute: 0 10*3/uL (ref 0.0–0.1)
Basophils Relative: 1 %
Eosinophils Absolute: 0.1 10*3/uL (ref 0.0–0.5)
Eosinophils Relative: 3 %
Immature Granulocytes: 0 %
Lymphocytes Relative: 38 %
Lymphs Abs: 1.6 10*3/uL (ref 0.7–4.0)
Monocytes Absolute: 0.3 10*3/uL (ref 0.1–1.0)
Monocytes Relative: 6 %
Neutro Abs: 2.2 10*3/uL (ref 1.7–7.7)
Neutrophils Relative %: 52 %

## 2020-03-14 LAB — I-STAT CHEM 8, ED
BUN: 18 mg/dL (ref 6–20)
Calcium, Ion: 1.18 mmol/L (ref 1.15–1.40)
Chloride: 107 mmol/L (ref 98–111)
Creatinine, Ser: 0.9 mg/dL (ref 0.44–1.00)
Glucose, Bld: 142 mg/dL — ABNORMAL HIGH (ref 70–99)
HCT: 30 % — ABNORMAL LOW (ref 36.0–46.0)
Hemoglobin: 10.2 g/dL — ABNORMAL LOW (ref 12.0–15.0)
Potassium: 3.8 mmol/L (ref 3.5–5.1)
Sodium: 141 mmol/L (ref 135–145)
TCO2: 26 mmol/L (ref 22–32)

## 2020-03-14 LAB — CBG MONITORING, ED
Glucose-Capillary: 150 mg/dL — ABNORMAL HIGH (ref 70–99)
Glucose-Capillary: 161 mg/dL — ABNORMAL HIGH (ref 70–99)

## 2020-03-14 LAB — I-STAT BETA HCG BLOOD, ED (MC, WL, AP ONLY): I-stat hCG, quantitative: <5 m[IU]/mL (ref <5)

## 2020-03-14 LAB — APTT: aPTT: 28 seconds (ref 24–36)

## 2020-03-14 IMAGING — CT CT HEAD W/O CM
4 series · 16 of 47 positions shown, 18 images · non-contrast
Comparison: CT [DATE]

CLINICAL DATA: Headache. Concern for intracranial hemorrhage.
Numbness.

EXAM:
CT HEAD WITHOUT CONTRAST
TECHNIQUE: Contiguous axial images were obtained from the base of the skull
through the vertex without intravenous contrast.

[Series 3: head wo · axial · 0.46mm/px · z∈[+1294,+1414]mm · 7 of 32 slices shown, 9 images]
[im 4/32  brain]
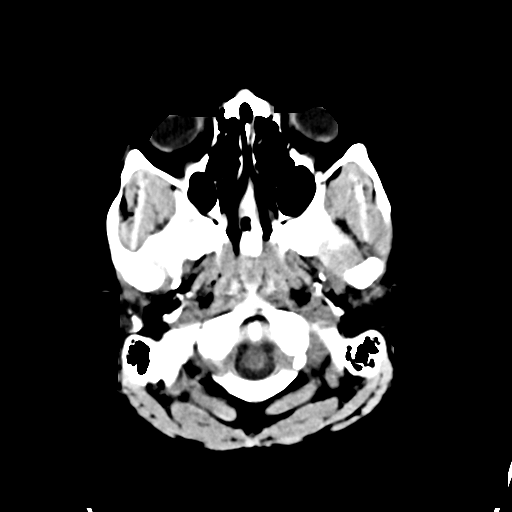
[im 4/32  bone]
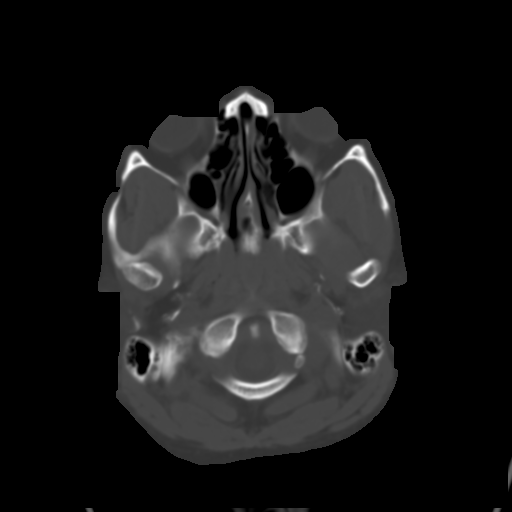
[im 8/32  brain]
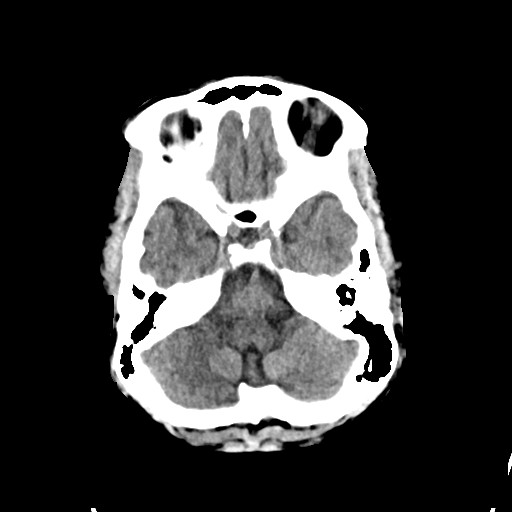
[im 12/32  brain]
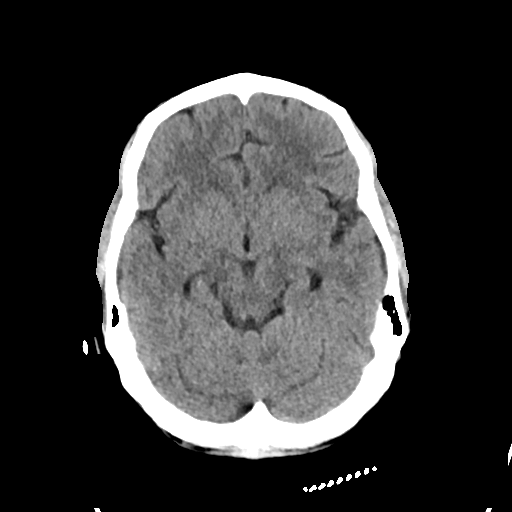
[im 16/32  brain]
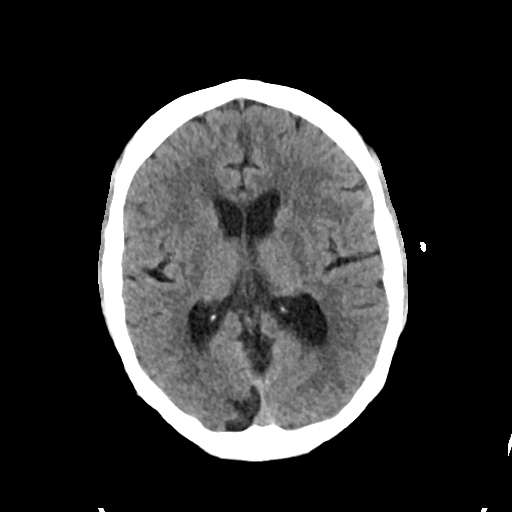
[im 20/32  brain]
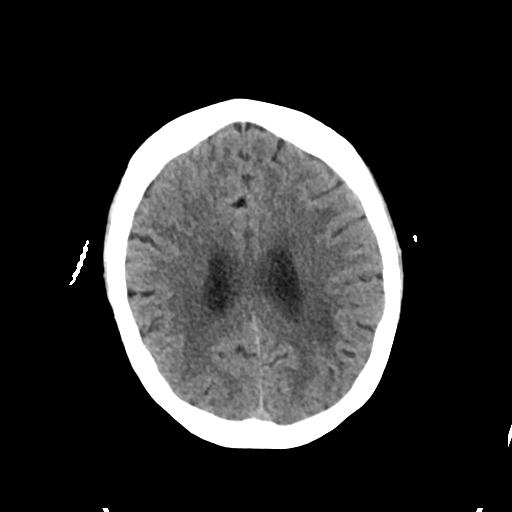
[im 20/32  bone]
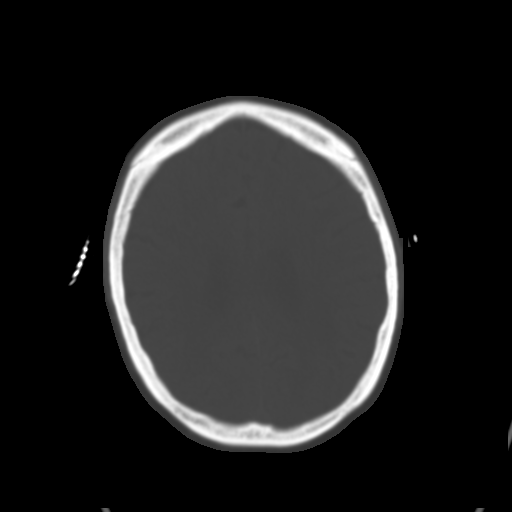
[im 24/32  brain]
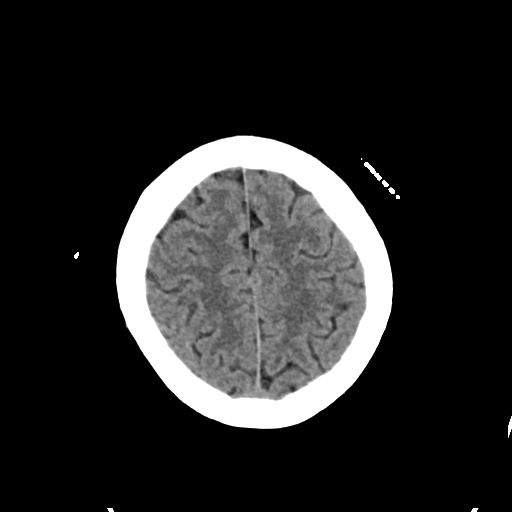
[im 28/32  brain]
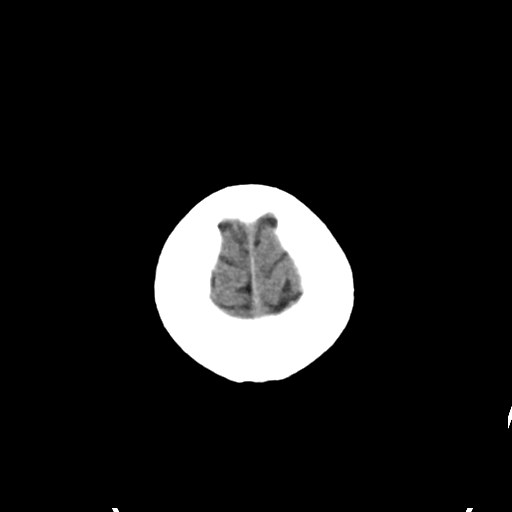

[Series 4: head bone · axial · 0.46mm/px · z∈[+1293,+1325]mm · 3 of 79 slices shown]
[im 8/79  bone]
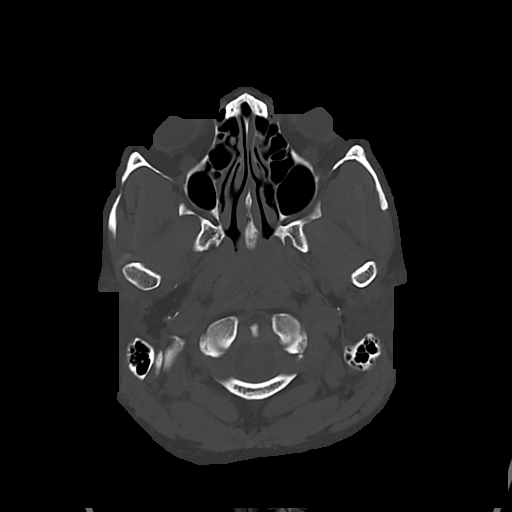
[im 16/79  bone]
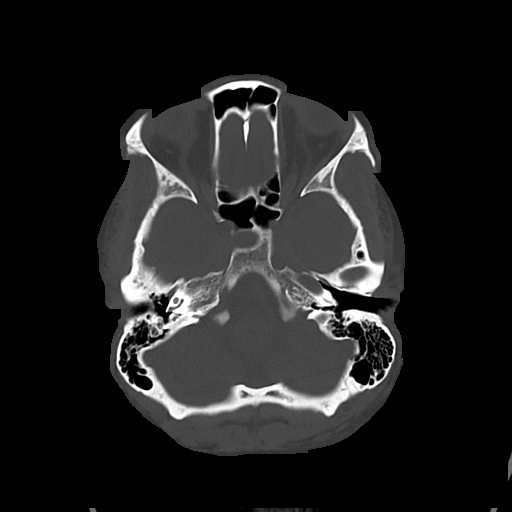
[im 24/79  bone]
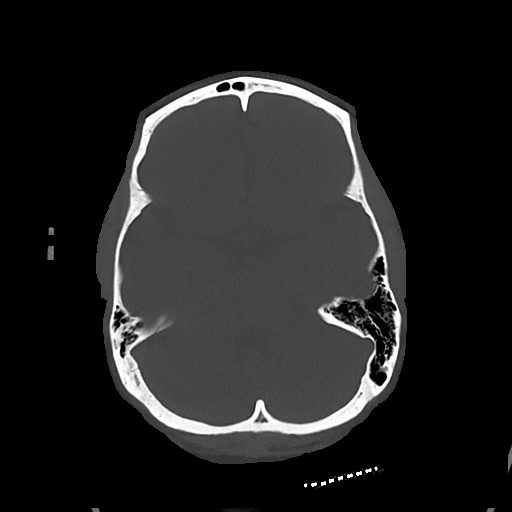

[Series 5: cor soft · coronal · 0.34mm/px · 3 of 86 slices shown]
[im 29/86  brain]
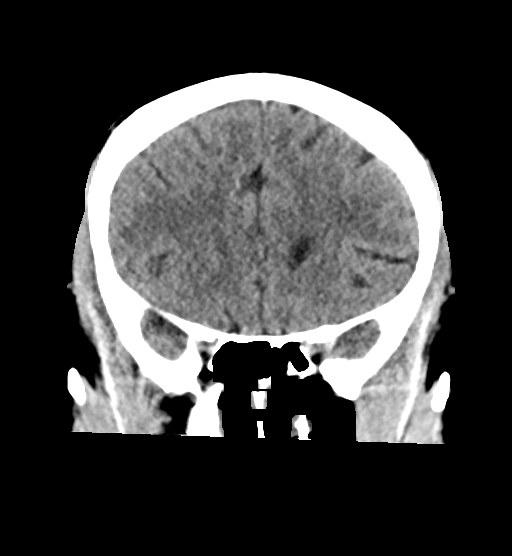
[im 38/86  brain]
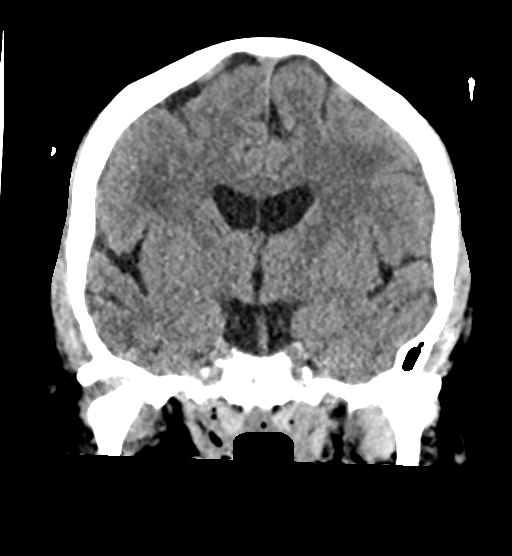
[im 48/86  brain]
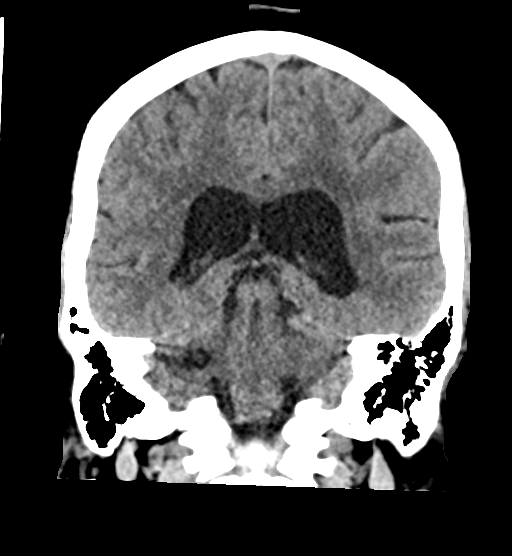

[Series 6: sag soft · sagittal · 0.37mm/px · 3 of 59 slices shown]
[im 20/59  brain]
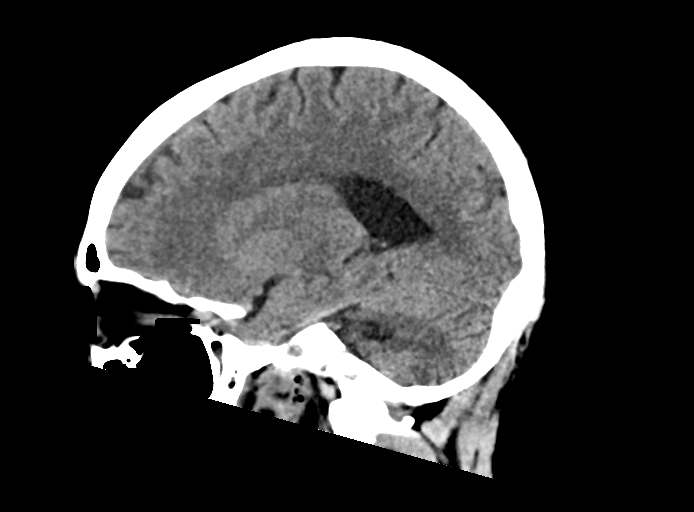
[im 30/59  brain]
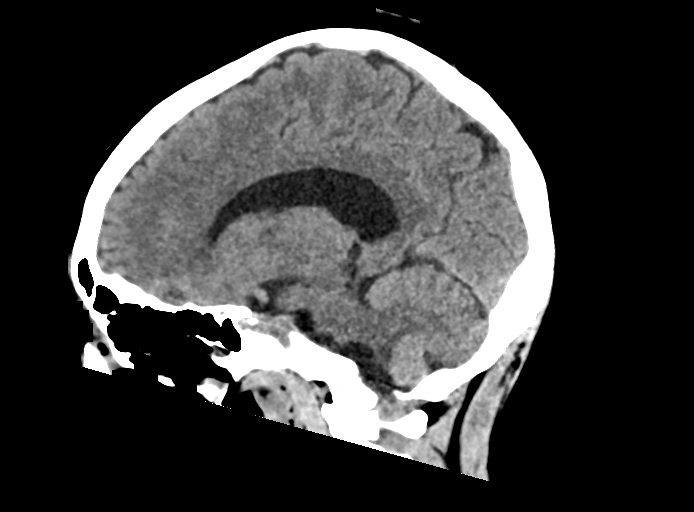
[im 39/59  brain]
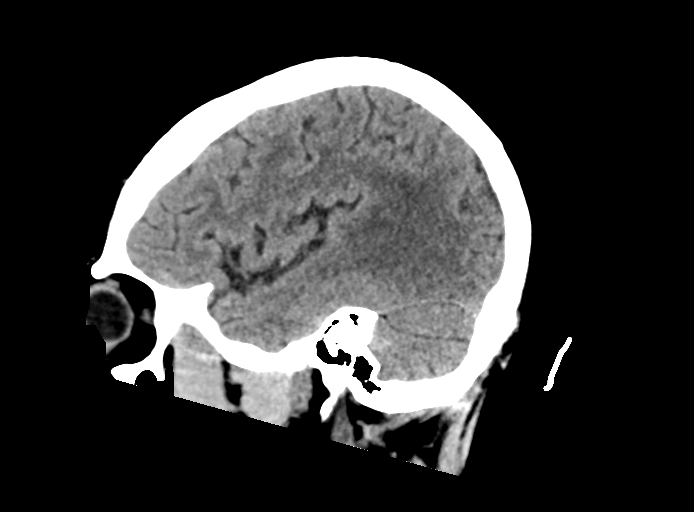

[16 of 47 positions shown; findings below may reference images not displayed]

FINDINGS: Brain: Similar appearance of a remote right PCA territory infarct.
Ill-defined hypodensity within the right middle cerebellar peduncle
and subtle hypodensities within the right pons, compatible with
residual edema associated with recent infarcts better characterized
on prior MRI. The fourth ventricle is patent. No progressive
ventriculomegaly. No evidence of new/acute large vascular territory
infarct. Patchy periventricular hypoattenuation, compatible with
chronic microvascular ischemic change. No acute hemorrhage. No
midline shift. Basal cisterns are patent.

Vascular: Calcific atherosclerosis.

Skull: Normal. Negative for fracture or focal lesion.

Sinuses/Orbits: Remote right medial orbital wall fracture. Mild
scattered paranasal sinus mucosal thickening. Small amount of frothy
secretions left sphenoid sinus. No air-fluid levels.

Other: No mastoid effusions.
IMPRESSION: 1. No evidence of acute intracranial abnormality. No acute
hemorrhage.
2. Sequela of recent infarcts involving the right middle cerebellar
peduncle and right pons. If there is concern for
extension/peri-infarct ischemia of these infarcts, MRI could better
evaluate.
3. Similar remote right PCA territory infarct.
4. Chronic microvascular ischemic change.

## 2020-03-14 MED ORDER — SODIUM CHLORIDE 0.9% FLUSH: 3.0000 mL | Freq: Once | INTRAVENOUS | Status: DC

## 2020-03-14 NOTE — Discharge Instructions (Addendum)
Please go to the ER for worsening symptoms.

## 2020-03-14 NOTE — ED Provider Notes (Signed)
[facility]    CSN: [id number] Arrival date & time: 03/14/20  1307      History   Chief Complaint No chief complaint on file.   HPI Kaitlyn Gray is a 44 y.o. female.   Patient is a 44-year-old female with past medical history bipolar, hypertension, ischemic stroke, pseudoseizure, diabetes, acute encephalopathy, cocaine abuse.  She presents today with 2 days of headache, worsening slurred speech, worsening left side weakness, chest pain and left arm tightness.  Patient with history of ischemic stroke recently back in July.  Per family her deficits from this CVA have started to worsen this week.  He felt as if her symptoms were improving.  She has had some confusion.  Denies any falls.      Past Medical History:  Diagnosis Date  . Bipolar 1 disorder (HCC)   . 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 Hypertension     Patient Active Problem List   Diagnosis Date Noted  . 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  . Normocytic anemia 02/09/2020  . Ischemic stroke (HCC) 01/01/2020  . Acute encephalopathy 05/12/2018  . Diabetes mellitus without complication (HCC) 12/06/2017  . Hypokalemia 12/06/2017  . Hypertension 12/06/2017  . 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 Bipolar disorder (HCC) 07/19/2017  . Polysubstance abuse (HCC) 07/19/2017  . Chest pain 07/19/2017  . Cocaine use disorder, severe, dependence (HCC) 03/24/2015  . Cannabis use disorder, moderate, dependence (HCC) 03/24/2015  . MDD (major depressive disorder), recurrent severe, 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    Past Surgical History:  Procedure Laterality Date  . CESAREAN SECTION      OB History   No obstetric history on file.      Home Medications    Prior to Admission medications   Medication Sig Start Date End Date Taking? Authorizing Provider  tiZANidine  (ZANAFLEX) 4 MG tablet Take 4 mg by mouth every 6 (six) hours as needed for muscle spasms.   Yes [provider]  blood glucose meter kit and supplies KIT Dispense based on patient and insurance preference. Use up to four times daily as directed. (FOR ICD-9 250.00, 250.01). Please give 3 months supply of lancets and test strips to allow her to check QID. Patient not taking: Reported on 02/09/2020 12/07/17   Emokpae, Courage, MD  blood glucose meter kit and supplies Relion Prime or Dispense other brand based on patient and insurance preference. Use up to four times daily as directed. (FOR ICD-9 250.00, 250.01). Patient not taking: Reported on 02/09/2020 12/07/17   Emokpae, Courage, MD  ferrous sulfate 325 (65 FE) MG tablet Take 1 tablet (325 mg total) by mouth daily. Patient not taking: Reported on 02/09/2020 01/20/20   Pfeiffer, Marcy, MD  hydrOXYzine (ATARAX/VISTARIL) 25 MG tablet Take 1 tablet (25 mg total) by mouth every 6 (six) hours as needed. Patient not taking: Reported on 01/01/2020 12/16/19   Goldston, Scott, MD  Insulin Glargine (LANTUS SOLOSTAR) 100 UNIT/ML Solostar Pen Inject 45 Units into the skin every morning. Patient not taking: Reported on 12/16/2019 12/07/17   Emokpae, Courage, MD  insulin lispro (HUMALOG KWIKPEN) 100 UNIT/ML KiwkPen Inject 0-0.2 mLs (0-20 Units total) into the skin 4 (four) times daily -  before meals and at bedtime. Inject 0-20 Units into the skin 3 (three) times daily with meals. CBG 70 - 120: 0 units  CBG 121 - 150: 3 units    CBG 151 - 200: 4 units  CBG 201 - 250: 7 units  CBG 251 - 300: 11 units  CBG 301 - 350: 15 units  CBG 351 - 400: 20 units Patient not taking: Reported on 12/16/2019 12/07/17   Emokpae, Courage, MD  Insulin Pen Needle 31G X 5 MM MISC Use as directed Patient not taking: Reported on 02/09/2020 12/07/17   Emokpae, Courage, MD  lisinopril (ZESTRIL) 10 MG tablet Take 1 tablet (10 mg total) by mouth daily. 01/20/20 02/19/20  Pfeiffer, Marcy, MD   metFORMIN (GLUCOPHAGE) 1000 MG tablet Take 1 tablet (1,000 mg total) by mouth 2 (two) times daily with a meal. 01/20/20   Pfeiffer, Marcy, MD    Family History Family History  Problem Relation Age of Onset  . Hypertension Mother   . CAD Mother 44       died of MI at age 44  . Hypertension Father     Social History Social History   Tobacco Use  . Smoking status: Current Every Day Smoker    Packs/day: 0.50    Types: Cigarettes  . Smokeless tobacco: Never Used  Vaping Use  . Vaping Use: Never used  Substance Use Topics  . Alcohol use: No  . Drug use: Yes    Types: Marijuana, Cocaine     Allergies   Hydrocodone and Latex   Review of Systems Review of Systems   Physical Exam Triage Vital Signs ED Triage Vitals  Enc Vitals Group     BP 03/14/20 1314 119/86     Pulse Rate 03/14/20 1314 71     Resp 03/14/20 1314 15     Temp 03/14/20 1314 98.1 F (36.7 C)     Temp Source 03/14/20 1314 Oral     SpO2 03/14/20 1314 99 %     Weight --      Height --      Head Circumference --      Peak Flow --      Pain Score 03/14/20 1317 7     Pain Loc --      Pain Edu? --      Excl. in GC? --    No data found.  Updated Vital Signs BP 119/86 (BP Location: Left Arm)   Pulse 71   Temp 98.1 F (36.7 C) (Oral)   Resp 15   SpO2 99%   Visual Acuity Right Eye Distance:   Left Eye Distance:   Bilateral Distance:    Right Eye Near:   Left Eye Near:    Bilateral Near:     Physical Exam Vitals and nursing note reviewed.  Constitutional:      General: She is not in acute distress.    Appearance: Normal appearance. She is not ill-appearing, toxic-appearing or diaphoretic.  HENT:     Head: Normocephalic.     Nose: Nose normal.  Eyes:     Conjunctiva/sclera: Conjunctivae normal.  Pulmonary:     Effort: Pulmonary effort is normal.  Musculoskeletal:        General: Normal range of motion.     Cervical back: Normal range of motion.  Skin:    General: Skin is warm and  dry.     Findings: No rash.  Neurological:     Mental Status: She is alert.     Motor: Weakness present.     Gait: Gait abnormal.     Comments: Confused  Left-sided facial droop, left arm drift and left lower extremity drift Left   grip strength less than right. Slurred speech. Lethargic   Psychiatric:        Mood and Affect: Mood normal.      UC Treatments / Results  Labs (all labs ordered are listed, but only abnormal results are displayed) Labs Reviewed  CBG MONITORING, ED - Abnormal; Notable for the following components:      Result Value   Glucose-Capillary 150 (*)    All other components within normal limits    EKG   Radiology No results found.  Procedures Procedures (including critical care time)  Medications Ordered in UC Medications - No data to display  Initial Impression / Assessment and Plan / UC Course  I have reviewed the triage vital signs and the nursing notes.  Pertinent labs & imaging results that were available during my care of the patient were reviewed by me and considered in my medical decision making (see chart for details).     Left-sided weakness, slurred speech, confusion, headache. Patient with previous CVA  and per family patient has worsened over the last week with worsening left-sided deficits. Concern for another CVA versus intracranial hemorrhage versus cocaine induced Sending to the ER for further evaluation management. Recommended go by EMS but significant other preferred to take her down himself via wheelchair.  Final Clinical Impressions(s) / UC Diagnoses   Final diagnoses:  Left-sided weakness     Discharge Instructions     Please go to the ER for worsening symptoms     ED Prescriptions    None     PDMP not reviewed this encounter.   ,  A, NP 03/14/20 1418  

## 2020-03-14 NOTE — ED Triage Notes (Signed)
Patient presents to [facility] for assessment of headache x 2 days, with hx of multiple strokes in the past 12 months.  Patient has slurred speech, left sided pronator drift, upon assessment at triage.  Patient also c/o intermittent upper mid chest pain, none at this time.  Traci, APP made aware of chief complaint, current findings and EKG ordered.

## 2020-03-14 NOTE — ED Notes (Signed)
Pt referred to ED. Pt's  Family taking Pt to ED now.

## 2020-03-14 NOTE — ED Triage Notes (Addendum)
Pt sent here from [facility] with hx of multiple strokes and two day hx of headache. Pt with slurred speech and headache today. Prior to triage, pt got out of chair, walked out to car to speak to husband and then returned to ED. Pt continually saying "where's Alinda Money" and will not provide other info.  Further hx obtained from husband after he was let back to triage area: around midnight this morning, pt had L sided arm and leg numbness and difficulty balancing, frequent falls, and difficulty swallowing. Pt and her husband are both falling asleep during the triage and refusing to answer questions.

## 2020-03-15 ENCOUNTER — Emergency Department (HOSPITAL_COMMUNITY): Payer: Medicaid Other

## 2020-03-15 IMAGING — MR MR HEAD W/O CM
12 of 13 series · 43 of 48 positions shown · non-contrast
Comparison: Prior head CT examinations [DATE] and earlier.
Prior brain MRI examinations [DATE] and earlier.

CLINICAL DATA: Neuro deficit, acute, stroke suspected; worsening
stroke symptoms, left arm weakness, slurred speech.

EXAM:
MRI HEAD WITHOUT CONTRAST
TECHNIQUE: Multiplanar, multiecho pulse sequences of the brain and surrounding
structures were obtained without intravenous contrast.

[Series 5: DWI · axial · 3.0mm · 0.88mm/px · z∈[-153,-11]mm · 7 of 100 slices shown (1 of 4)]
[im 1/100]
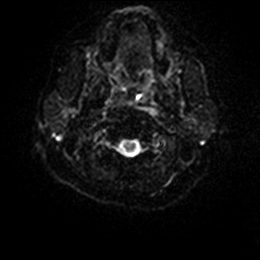
[im 17/100]
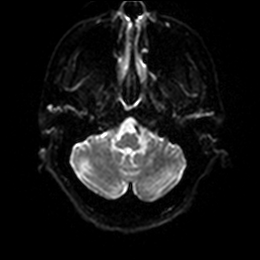
[im 34/100]
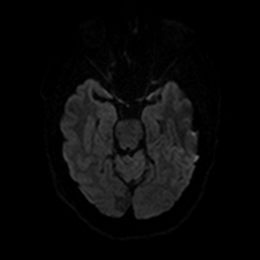
[im 50/100]
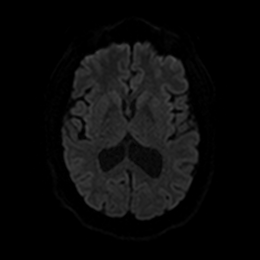
[im 67/100]
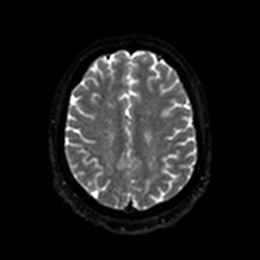
[im 83/100]
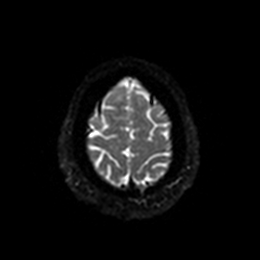
[im 100/100]
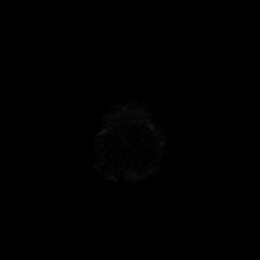

[Series 6: DWI · axial · 3.0mm · 0.88mm/px · z∈[-153,-11]mm · 3 of 50 slices shown (2 of 4)]
[im 1/50]
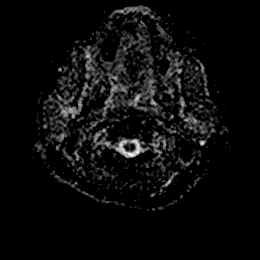
[im 25/50]
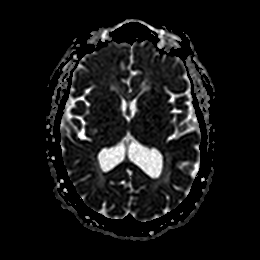
[im 50/50]
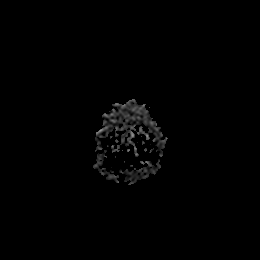

[Series 7: DWI · coronal · 4.0mm · 0.88mm/px · 5 of 64 slices shown (3 of 4)]
[im 1/64]
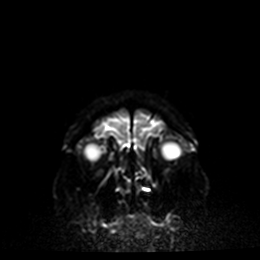
[im 16/64]
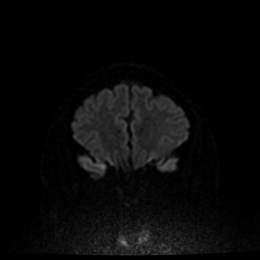
[im 32/64]
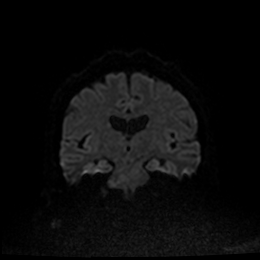
[im 48/64]
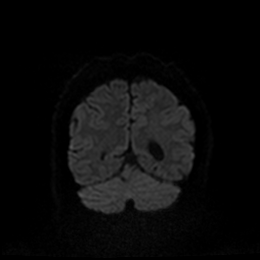
[im 64/64]
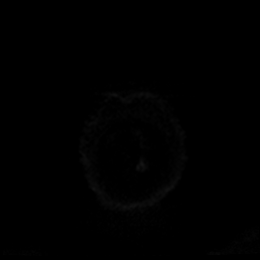

[Series 8: DWI · coronal · 4.0mm · 0.88mm/px · 2 of 32 slices shown (4 of 4)]
[im 1/32]
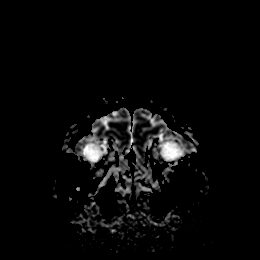
[im 32/32]
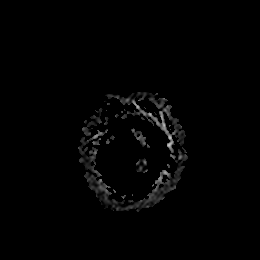

[Series 9: T1 · sagittal · 5.0mm · 0.75mm/px · 2 of 23 slices shown]
[im 1/23]
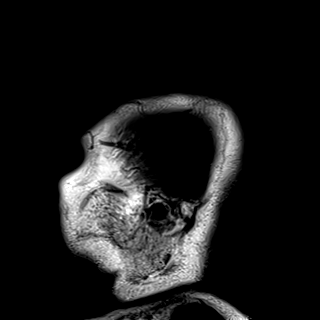
[im 23/23]
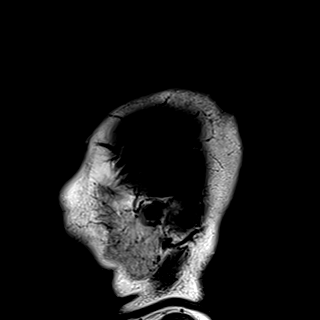

[Series 10: T2 · axial · 5.0mm · 0.72mm/px · z∈[-146,-6]mm · 2 of 25 slices shown (1 of 2)]
[im 1/25]
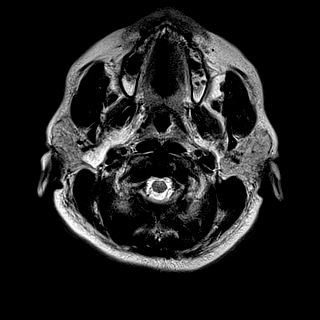
[im 25/25]
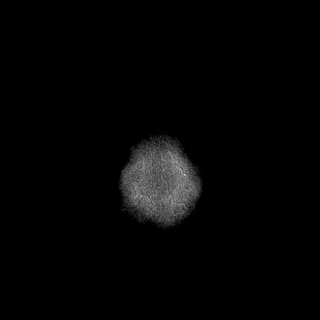

[Series 11: FLAIR · axial · 5.0mm · 0.45mm/px · z∈[-148,-8]mm · 2 of 25 slices shown]
[im 1/25]
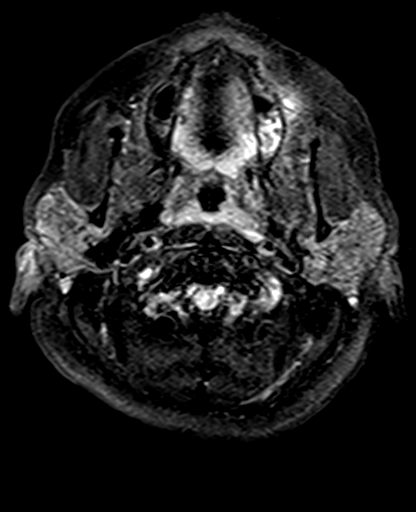
[im 25/25]
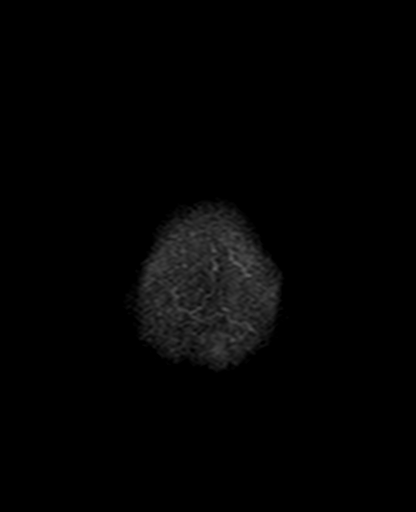

[Series 12: mag_images · axial · 3.0mm · 0.90mm/px · z∈[-164,+8]mm · 5 of 60 slices shown]
[im 1/60]
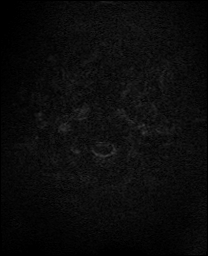
[im 15/60]
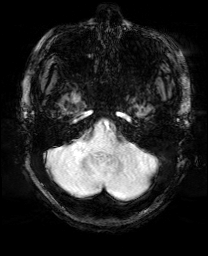
[im 30/60]
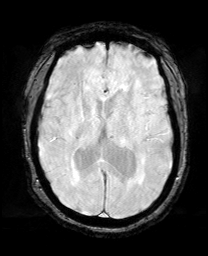
[im 45/60]
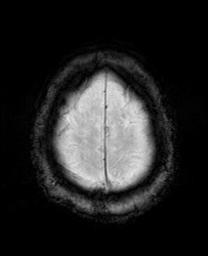
[im 60/60]
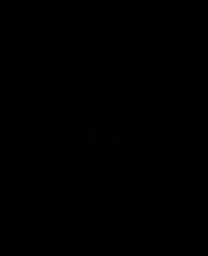

[Series 13: pha_images · axial · 3.0mm · 0.90mm/px · z∈[-158,-1]mm · 4 of 55 slices shown]
[im 1/55]
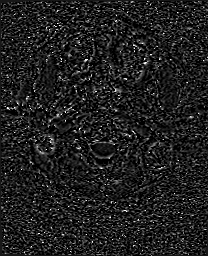
[im 19/55]
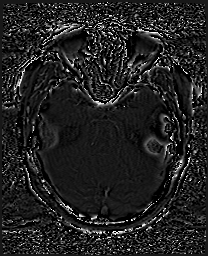
[im 37/55]
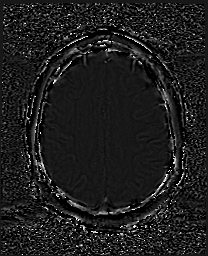
[im 55/55]
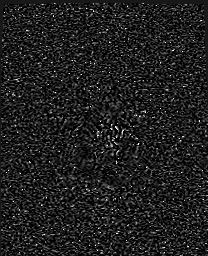

[Series 14: swi_images · axial · 3.0mm · 0.90mm/px · z∈[-164,+8]mm · 5 of 60 slices shown]
[im 1/60]
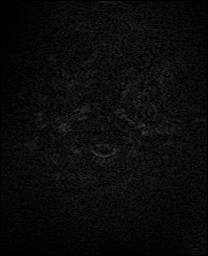
[im 15/60]
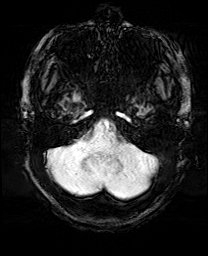
[im 30/60]
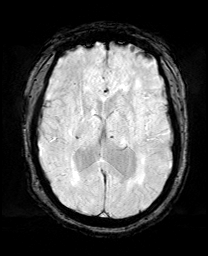
[im 45/60]
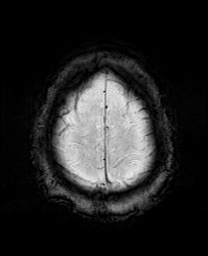
[im 60/60]
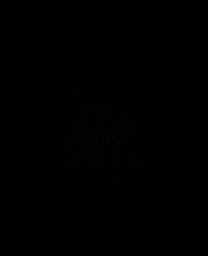

[Series 15: mip_images(sw) · axial · 24.0mm · 0.90mm/px · z∈[-154,-2]mm · 4 of 53 slices shown]
[im 1/53]
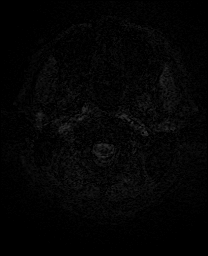
[im 18/53]
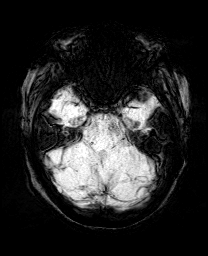
[im 35/53]
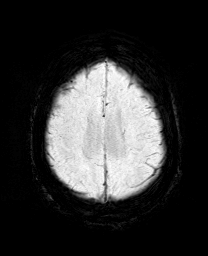
[im 53/53]
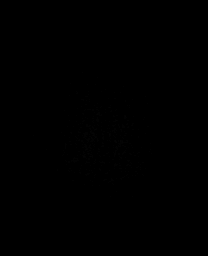

[Series 17: T2 · coronal · 5.0mm · 0.34mm/px · 2 of 29 slices shown (2 of 2)]
[im 1/29]
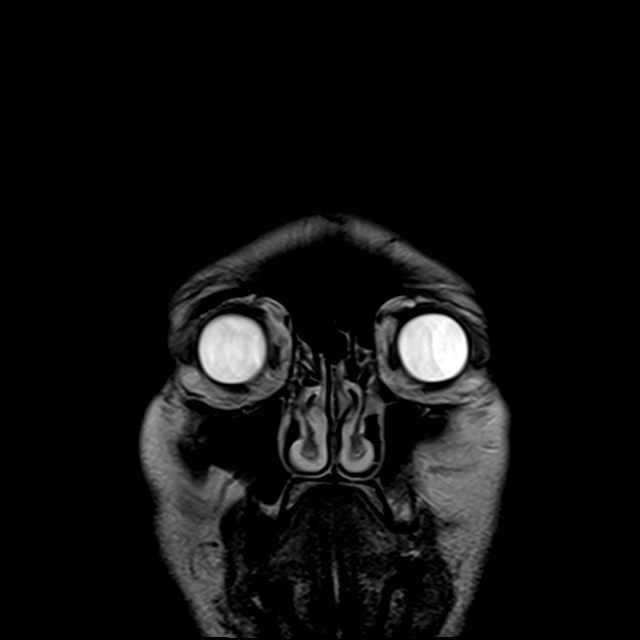
[im 29/29]
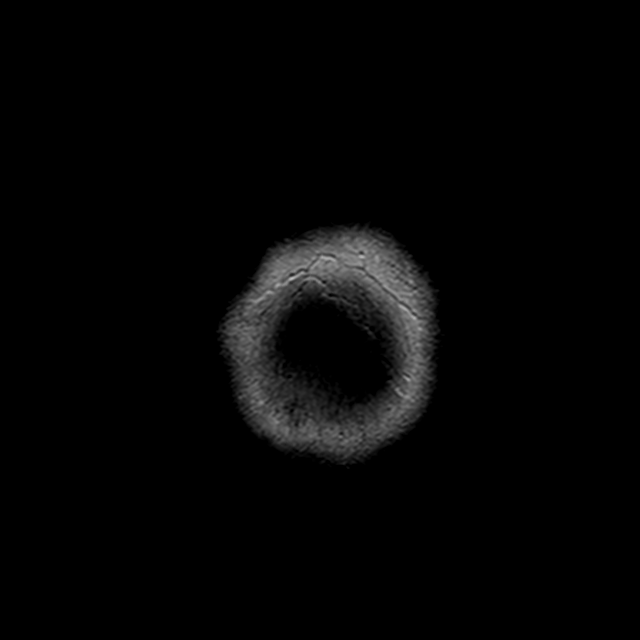

[43 of 48 positions shown; findings below may reference images not displayed]

FINDINGS: Brain:

Cerebral volume is normal for age.

Small chronic cortically based infarct within the right occipital
lobe.

Remote insult within the right middle cerebellar peduncle measuring
1.6 cm, likely a chronic infarct.

Remote infarct within the paramedian right pons (series 10, image
7).

Tiny chronic infarcts within the right cerebellar hemisphere.

Stable moderate multifocal T2/FLAIR hyperintensity within the
cerebral white matter and pons which is nonspecific, but likely
reflect sequela of chronic microvascular ischemia given the
patient's history of hypertension and diabetes.

Redemonstrated chronic microhemorrhages within the thalami and
cerebellum likely reflecting sequela of hypertensive
microangiopathy.

There is no acute infarct.

No evidence of intracranial mass.

No extra-axial fluid collection.

No midline shift.

Vascular: Expected proximal arterial flow voids.

Skull and upper cervical spine: No focal marrow lesion.

Sinuses/Orbits: Visualized orbits show no acute finding. Frothy
secretions within the left sphenoid sinus. Mild ethmoid sinus
mucosal thickening. No significant mastoid effusion.
IMPRESSION: 1. No evidence of acute intracranial abnormality, including acute
infarction.
2. Unchanged non-contrast MRI appearance of the brain as compared to
[DATE].
3. Chronic right occipital lobe cortical infarct.
4. Chronic right pontine infarct.
5. Tiny chronic infarcts within the right cerebellar hemisphere.
6. Remote insult within the right middle cerebellar peduncle, likely
a chronic infarct.
7. Stable moderate multifocal T2 hyperintense signal changes within
the cerebral white matter and pons, nonspecific but likely reflect
chronic small vessel ischemic disease given the patient's history of
hypertension and diabetes.
8. Chronic microhemorrhages within the thalami and cerebellum
consistent with sequela of hypertensive microangiopathy.
9. Left sphenoid sinusitis.  Mild ethmoid sinus mucosal thickening.

## 2020-03-15 MED ORDER — ATORVASTATIN CALCIUM 80 MG PO TABS: 80.0000 mg | ORAL_TABLET | Freq: Every day | ORAL | 1 refills | Status: DC

## 2020-03-15 MED ORDER — ASPIRIN 81 MG PO CHEW: 81.0000 mg | CHEWABLE_TABLET | Freq: Every day | ORAL | 1 refills | Status: DC

## 2020-03-15 MED ORDER — ASPIRIN 81 MG PO CHEW
324.0000 mg | CHEWABLE_TABLET | Freq: Once | ORAL | Status: AC
  Administered 2020-03-15: 324 mg via ORAL
  Filled 2020-03-15: qty 4

## 2020-03-15 NOTE — Discharge Planning (Signed)
RNCM consulted in regards to medication assistance.  Pt has insurance coverage and is not eligible for Medication Assistance Through [place] [facility]) program.  No further CM needs communicated at this time.

## 2020-03-15 NOTE — ED Notes (Signed)
Pt transported to MRI 

## 2020-03-15 NOTE — Discharge Planning (Signed)
Cirilo Canner J. Lucretia Roers, RN, BSN, [place] [phone]  RNCM set up appointment with [facility] on 03/26/20 @ 0930.  Spoke with pt at bedside and advised to please arrive 15 min early and take a picture ID and your current medications.  Pt verbalizes understanding of keeping appointment.

## 2020-03-15 NOTE — ED Notes (Signed)
Pt transported back from MRI

## 2020-03-15 NOTE — ED Provider Notes (Signed)
[facility] EMERGENCY DEPARTMENT Provider Note   CSN: [id number] Arrival date & time: 03/14/20  1516     History No chief complaint on file.   Kaitlyn Gray is a 44 y.o. female.  Patient with history of stroke, admitted in July 2021 for right-sided symptoms, August 2021 for left-sided symptoms, both times left AMA, prescribed aspirin but not compliant, history of bipolar disorder and diabetes --presents the emergency department for evaluation of worsening left arm, left leg weakness, right-sided facial weakness and numbness, slurred speech.  Patient has also had a headache.  She states that her symptoms started over the weekend, 4 to 5 days ago.  She presented to urgent care last night and was sent to the emergency department.  She states that she is off balance with walking but has not fallen.  She denies head injury.  Unfortunately, she had a 17-hour wait in the waiting room prior to being seen.  She denies symptoms of illness including fever, chest pain, cough, URI symptoms, nausea, vomiting, diarrhea.  No urinary symptoms.        Past Medical History:  Diagnosis Date  . Bipolar 1 disorder ([place])   . 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 ([place]) 07/19/2017  . Hypertension     Patient Active Problem List   Diagnosis Date Noted  . 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  . Normocytic anemia 02/09/2020  . Ischemic stroke ([place]) 01/01/2020  . Acute encephalopathy 05/12/2018  . Diabetes mellitus without complication ([place]) 69/67/8938  . Hypokalemia 12/06/2017  . Hypertension 12/06/2017  . 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 ([place]) 07/19/2017  . Bipolar disorder ([place]) 07/19/2017  . Polysubstance abuse ([place]) 07/19/2017  . Chest pain 07/19/2017  . Cocaine use disorder, severe, dependence ([place]) 03/24/2015  . Cannabis use disorder, moderate, dependence ([place]) 03/24/2015  . MDD  (major depressive disorder), recurrent severe, without psychosis ([place]) 03/24/2015    Past Surgical History:  Procedure Laterality Date  . CESAREAN SECTION       OB History   No obstetric history on file.     Family History  Problem Relation Age of Onset  . Hypertension Mother   . CAD Mother 68       died of MI at age 62  . Hypertension Father     Social History   Tobacco Use  . Smoking status: Current Every Day Smoker    Packs/day: 0.50    Types: Cigarettes  . Smokeless tobacco: Never Used  Vaping Use  . Vaping Use: Never used  Substance Use Topics  . Alcohol use: No  . Drug use: Yes    Types: Marijuana, Cocaine    Home Medications Prior to Admission medications   Medication Sig Start Date End Date Taking? Authorizing Provider  blood glucose meter kit and supplies KIT Dispense based on patient and insurance preference. Use up to four times daily as directed. (FOR ICD-9 250.00, 250.01). Please give 3 months supply of lancets and test strips to allow her to check QID. Patient not taking: Reported on 02/09/2020 12/07/17   Roxan Hockey, MD  blood glucose meter kit and supplies Relion Prime or Dispense other brand based on patient and insurance preference. Use up to four times daily as directed. (FOR ICD-9 250.00, 250.01). Patient not taking: Reported on 02/09/2020 12/07/17   Roxan Hockey, MD  ferrous sulfate 325 (65 FE) MG tablet Take 1 tablet (325 mg total) by mouth  daily. Patient not taking: Reported on 02/09/2020 01/20/20   Charlesetta Shanks, MD  hydrOXYzine (ATARAX/VISTARIL) 25 MG tablet Take 1 tablet (25 mg total) by mouth every 6 (six) hours as needed. Patient not taking: Reported on 01/01/2020 12/16/19   Sherwood Gambler, MD  Insulin Glargine (LANTUS SOLOSTAR) 100 UNIT/ML Solostar Pen Inject 45 Units into the skin every morning. Patient not taking: Reported on 12/16/2019 12/07/17   Roxan Hockey, MD  insulin lispro (HUMALOG KWIKPEN) 100 UNIT/ML KiwkPen Inject 0-0.2 mLs  (0-20 Units total) into the skin 4 (four) times daily -  before meals and at bedtime. Inject 0-20 Units into the skin 3 (three) times daily with meals. CBG 70 - 120: 0 units  CBG 121 - 150: 3 units  CBG 151 - 200: 4 units  CBG 201 - 250: 7 units  CBG 251 - 300: 11 units  CBG 301 - 350: 15 units  CBG 351 - 400: 20 units Patient not taking: Reported on 12/16/2019 12/07/17   Roxan Hockey, MD  Insulin Pen Needle 31G X 5 MM MISC Use as directed Patient not taking: Reported on 02/09/2020 12/07/17   Roxan Hockey, MD  lisinopril (ZESTRIL) 10 MG tablet Take 1 tablet (10 mg total) by mouth daily. 01/20/20 02/19/20  Charlesetta Shanks, MD  metFORMIN (GLUCOPHAGE) 1000 MG tablet Take 1 tablet (1,000 mg total) by mouth 2 (two) times daily with a meal. 01/20/20   Charlesetta Shanks, MD  tiZANidine (ZANAFLEX) 4 MG tablet Take 4 mg by mouth every 6 (six) hours as needed for muscle spasms.    [provider]    Allergies    Hydrocodone and Latex  Review of Systems   Review of Systems  Constitutional: Negative for fever.  HENT: Negative for rhinorrhea and sore throat.   Eyes: Negative for redness.  Respiratory: Negative for cough and shortness of breath.   Cardiovascular: Negative for chest pain.  Gastrointestinal: Negative for abdominal pain, diarrhea, nausea and vomiting.  Genitourinary: Negative for dysuria, frequency, hematuria and urgency.  Musculoskeletal: Negative for myalgias.  Skin: Negative for rash.  Neurological: Positive for facial asymmetry, speech difficulty, weakness and headaches. Negative for seizures.    Physical Exam Updated Vital Signs BP 102/61   Pulse 73   Temp 98.5 F (36.9 C) (Oral)   Resp 18   SpO2 96%   Physical Exam Vitals and nursing note reviewed.  Constitutional:      General: She is not in acute distress.    Appearance: She is well-developed.  HENT:     Head: Normocephalic and atraumatic.     Right Ear: Tympanic membrane, ear canal and external ear normal.      Left Ear: Tympanic membrane, ear canal and external ear normal.     Nose: Nose normal.     Mouth/Throat:     Pharynx: Uvula midline.  Eyes:     General: Lids are normal.     Extraocular Movements:     Right eye: No nystagmus.     Left eye: No nystagmus.     Conjunctiva/sclera: Conjunctivae normal.     Pupils: Pupils are equal, round, and reactive to light.  Cardiovascular:     Rate and Rhythm: Normal rate and regular rhythm.     Heart sounds: No murmur heard.   Pulmonary:     Effort: Pulmonary effort is normal. No respiratory distress.     Breath sounds: Normal breath sounds. No wheezing, rhonchi or rales.  Abdominal:     Palpations: Abdomen  is soft.     Tenderness: There is no abdominal tenderness. There is no guarding or rebound.  Musculoskeletal:     Cervical back: Normal range of motion and neck supple. No tenderness or bony tenderness.     Right lower leg: No edema.     Left lower leg: No edema.  Skin:    General: Skin is warm and dry.     Findings: No rash.  Neurological:     General: No focal deficit present.     Mental Status: She is alert and oriented to person, place, and time. Mental status is at baseline.     GCS: GCS eye subscore is 4. GCS verbal subscore is 5. GCS motor subscore is 6.     Cranial Nerves: Cranial nerve deficit, dysarthria and facial asymmetry present.     Sensory: No sensory deficit.     Motor: Weakness present.     Comments: Slight R facial droop with smile, normal tongue protrusion, + dysarthria and difficult to understand speech  3/5 strength flexors and extensors L upper and lower extremities. Unable to lift arm against gravity. Poor grip on left. Gait not tested.   Psychiatric:        Mood and Affect: Mood normal.     ED Results / Procedures / Treatments   Labs (all labs ordered are listed, but only abnormal results are displayed) Labs Reviewed  CBC - Abnormal; Notable for the following components:      Result Value   RBC 3.81  (*)    Hemoglobin 9.4 (*)    HCT 31.5 (*)    MCH 24.7 (*)    MCHC 29.8 (*)    RDW 18.4 (*)    All other components within normal limits  COMPREHENSIVE METABOLIC PANEL - Abnormal; Notable for the following components:   Glucose, Bld 145 (*)    Total Protein 6.4 (*)    Albumin 3.3 (*)    All other components within normal limits  I-STAT CHEM 8, ED - Abnormal; Notable for the following components:   Glucose, Bld 142 (*)    Hemoglobin 10.2 (*)    HCT 30.0 (*)    All other components within normal limits  CBG MONITORING, ED - Abnormal; Notable for the following components:   Glucose-Capillary 161 (*)    All other components within normal limits  PROTIME-INR  APTT  DIFFERENTIAL  RAPID URINE DRUG SCREEN, HOSP PERFORMED  URINALYSIS, ROUTINE W REFLEX MICROSCOPIC  I-STAT BETA HCG BLOOD, ED ([facility], [facility], [facility] ONLY)    EKG EKG Interpretation  Date/Time:  Wednesday March 14 2020 15:39:56 EDT Ventricular Rate:  72 PR Interval:  142 QRS Duration: 98 QT Interval:  422 QTC Calculation: 462 R Axis:   16 Text Interpretation: Normal sinus rhythm Minimal voltage criteria for LVH, may be normal variant ( R in aVL ) Cannot rule out Anterior infarct , age undetermined Abnormal ECG Confirmed by Madalyn Rob [phone]) on 03/15/2020 7:54:13 AM   Radiology CT HEAD WO CONTRAST  Result Date: 03/14/2020 CLINICAL DATA:  Headache. Concern for intracranial hemorrhage. Numbness. EXAM: CT HEAD WITHOUT CONTRAST TECHNIQUE: Contiguous axial images were obtained from the base of the skull through the vertex without intravenous contrast. COMPARISON:  CT 02/12/2020 FINDINGS: Brain: Similar appearance of a remote right PCA territory infarct. Ill-defined hypodensity within the right middle cerebellar peduncle and subtle hypodensities within the right pons, compatible with residual edema associated with recent infarcts better characterized on prior MRI. The fourth ventricle is  patent. No progressive ventriculomegaly.  No evidence of new/acute large vascular territory infarct. Patchy periventricular hypoattenuation, compatible with chronic microvascular ischemic change. No acute hemorrhage. No midline shift. Basal cisterns are patent. Vascular: Calcific atherosclerosis. Skull: Normal. Negative for fracture or focal lesion. Sinuses/Orbits: Remote right medial orbital wall fracture. Mild scattered paranasal sinus mucosal thickening. Small amount of frothy secretions left sphenoid sinus. No air-fluid levels. Other: No mastoid effusions. IMPRESSION: 1. No evidence of acute intracranial abnormality. No acute hemorrhage. 2. Sequela of recent infarcts involving the right middle cerebellar peduncle and right pons. If there is concern for extension/peri-infarct ischemia of these infarcts, MRI could better evaluate. 3. Similar remote right PCA territory infarct. 4. Chronic microvascular ischemic change. Electronically Signed   By: Margaretha Sheffield MD   On: 03/14/2020 18:06    Procedures Procedures (including critical care time)  Medications Ordered in ED Medications  sodium chloride flush (NS) 0.9 % injection 3 mL (has no administration in time range)  aspirin chewable tablet 324 mg (324 mg Oral Given 03/15/20 1119)    ED Course  I have reviewed the triage vital signs and the nursing notes.  Pertinent labs & imaging results that were available during my care of the patient were reviewed by me and considered in my medical decision making (see chart for details).  Patient seen and examined.  Concern for new or worsening ischemic stroke.  Reviewed head CT last night without evidence of hemorrhage.  Will need repeat MRI given acute worsening of symptoms over the past week.  Vital signs reviewed and are as follows: BP 102/61   Pulse 73   Temp 98.5 F (36.9 C) (Oral)   Resp 18   SpO2 96%   MRI performed -- neg for new CVA.   Pt discussed with and seen by Dr. Roslynn Amble.   Discussed follow-up with [facility] team. They have  scheduled a primary care appointment for the patient.  Patient was updated.  Will prescribe aspirin and atorvastatin per previous hospitalization.  Patient strongly encouraged to follow-up with PCP as scheduled.  Patient counseled to return if they have weakness in their arms or legs, slurred speech, trouble walking or talking, confusion, trouble with their balance, or if they have any other concerns. Patient verbalizes understanding and agrees with plan.     MDM Rules/Calculators/A&P                          Patient with ongoing, but reportedly worsening, left sided weakness, dysarthria, left-sided facial weakness.  Repeat MRI today does not demonstrate any new strokes.  Patient has been noncompliant with her medications, leaving AMA at last hospitalization.  No indications for further admission today.  PCP appointment obtained patient encouraged to follow-up.  Prescriptions represcribed as above.  Strict return instructions as above.   Final Clinical Impression(s) / ED Diagnoses Final diagnoses:  Left hemiparesis ([place])  Dysarthria    Rx / DC Orders ED Discharge Orders         Ordered    atorvastatin (LIPITOR) 80 MG tablet  Daily        03/15/20 1206    aspirin 81 MG chewable tablet  Daily        03/15/20 1206           Carlisle Cater, PA-C 03/15/20 1210    Lucrezia Starch, MD 03/15/20 1409

## 2020-03-15 NOTE — Discharge Instructions (Signed)
Please read and follow all provided instructions.  Your diagnoses today include:  1. Left hemiparesis (HCC)   2. Dysarthria     Tests performed today include: Blood cell counts - were normal Electrolytes and kidney function - were normal  MRI/CT - do not show any new strokes today  Vital signs. See below for your results today.   Medications prescribed:  Aspirin and Atorvastatin - these are medications to help prevent stroke   Take any prescribed medications only as directed.  Home care instructions:  Follow any educational materials contained in this packet.  BE VERY CAREFUL not to take multiple medicines containing Tylenol (also called acetaminophen). Doing so can lead to an overdose which can damage your liver and cause liver failure and possibly death.   Follow-up instructions: Please follow-up with the primary care appointment as scheduled. This is VERY IMPORTANT.    Return instructions:   Please return to the Emergency Department if you experience worsening symptoms.   Return if you have weakness in your arms or legs, slurred speech, trouble walking or talking, confusion, or trouble with your balance.   Please return if you have any other emergent concerns.  Additional Information:  Your vital signs today were: BP (!) 146/77 (BP Location: Right Arm)   Pulse 62   Temp 98.5 F (36.9 C) (Oral)   Resp 17   Ht 5\' 4"  (1.626 m)   Wt 90.7 kg   SpO2 96%   BMI 34.33 kg/m  If your blood pressure (BP) was elevated above 135/85 this visit, please have this repeated by your doctor within one month. --------------
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
## 2020-03-15 NOTE — ED Notes (Signed)
Went into room to d/c pt. Pt was ambulating back to room from restroom. Pt apologizing for using the bathroom and pointed to corner. Pt had BM on wall and floor stating, " she could not hold it and needed to use the phone"  Pt was instructed to get dressed in order to be d/c. Pt verbalized understanding of the importance of using the bathroom and not the floor as well as the importance of picking up her medicine and following care with a PCP

## 2020-03-23 ENCOUNTER — Ambulatory Visit: Payer: Medicaid Other | Attending: Emergency Medicine

## 2020-03-24 ENCOUNTER — Other Ambulatory Visit: Payer: Self-pay

## 2020-03-24 ENCOUNTER — Emergency Department (HOSPITAL_COMMUNITY)
Admission: EM | Admit: 2020-03-24 | Discharge: 2020-03-24 | Disposition: A | Discharge status: Home / Self Care | Payer: Medicaid Other | Attending: Emergency Medicine | Admitting: Emergency Medicine

## 2020-03-24 ENCOUNTER — Encounter (HOSPITAL_COMMUNITY): Payer: Self-pay

## 2020-03-24 DIAGNOSIS — W19XXXA Unspecified fall, initial encounter: Secondary | ICD-10-CM

## 2020-03-24 DIAGNOSIS — Z91040 Latex allergy status: Secondary | ICD-10-CM | POA: Diagnosis not present

## 2020-03-24 DIAGNOSIS — I10 Essential (primary) hypertension: Secondary | ICD-10-CM | POA: Diagnosis not present

## 2020-03-24 DIAGNOSIS — Y9355 Activity, bike riding: Secondary | ICD-10-CM | POA: Insufficient documentation

## 2020-03-24 DIAGNOSIS — F17210 Nicotine dependence, cigarettes, uncomplicated: Secondary | ICD-10-CM | POA: Insufficient documentation

## 2020-03-24 DIAGNOSIS — M7918 Myalgia, other site: Secondary | ICD-10-CM | POA: Insufficient documentation

## 2020-03-24 DIAGNOSIS — Z79899 Other long term (current) drug therapy: Secondary | ICD-10-CM | POA: Insufficient documentation

## 2020-03-24 DIAGNOSIS — Z7982 Long term (current) use of aspirin: Secondary | ICD-10-CM | POA: Diagnosis not present

## 2020-03-24 DIAGNOSIS — Z7984 Long term (current) use of oral hypoglycemic drugs: Secondary | ICD-10-CM | POA: Diagnosis not present

## 2020-03-24 DIAGNOSIS — E1100 Type 2 diabetes mellitus with hyperosmolarity without nonketotic hyperglycemic-hyperosmolar coma (NKHHC): Secondary | ICD-10-CM | POA: Diagnosis not present

## 2020-03-24 DIAGNOSIS — M549 Dorsalgia, unspecified: Secondary | ICD-10-CM | POA: Diagnosis present

## 2020-03-24 HISTORY — DX: Cerebral infarction, unspecified: I63.9

## 2020-03-24 MED ORDER — IBUPROFEN 400 MG PO TABS
600.0000 mg | ORAL_TABLET | Freq: Once | ORAL | Status: AC
  Administered 2020-03-24: 600 mg via ORAL
  Filled 2020-03-24: qty 1
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
## 2020-03-26 ENCOUNTER — Inpatient Hospital Stay: Payer: Self-pay | Admitting: Critical Care Medicine
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
## 2020-03-29 ENCOUNTER — Inpatient Hospital Stay: Payer: Medicaid Other | Admitting: Internal Medicine

## 2020-04-04 ENCOUNTER — Telehealth (INDEPENDENT_AMBULATORY_CARE_PROVIDER_SITE_OTHER): Payer: Self-pay | Admitting: Primary Care

## 2020-04-04 ENCOUNTER — Ambulatory Visit (INDEPENDENT_AMBULATORY_CARE_PROVIDER_SITE_OTHER): Payer: Medicaid Other | Admitting: Primary Care

## 2020-04-04 NOTE — Telephone Encounter (Signed)
Called patient to reschedule missed appointment this morning at [phone], the phone never rang and I got a message of "call rejected"

## 2020-07-10 ENCOUNTER — Other Ambulatory Visit: Payer: Self-pay

## 2020-07-10 ENCOUNTER — Emergency Department (HOSPITAL_COMMUNITY): Payer: Medicaid Other

## 2020-07-10 ENCOUNTER — Encounter (HOSPITAL_COMMUNITY): Payer: Self-pay | Admitting: *Deleted

## 2020-07-10 ENCOUNTER — Emergency Department (HOSPITAL_COMMUNITY)
Admission: EM | Admit: 2020-07-10 | Discharge: 2020-07-10 | Disposition: A | Discharge status: Home / Self Care | Payer: Medicaid Other | Attending: Emergency Medicine | Admitting: Emergency Medicine

## 2020-07-10 DIAGNOSIS — R202 Paresthesia of skin: Secondary | ICD-10-CM | POA: Insufficient documentation

## 2020-07-10 DIAGNOSIS — R059 Cough, unspecified: Secondary | ICD-10-CM | POA: Insufficient documentation

## 2020-07-10 DIAGNOSIS — Z20822 Contact with and (suspected) exposure to covid-19: Secondary | ICD-10-CM | POA: Diagnosis not present

## 2020-07-10 DIAGNOSIS — R079 Chest pain, unspecified: Secondary | ICD-10-CM | POA: Diagnosis not present

## 2020-07-10 DIAGNOSIS — Z5321 Procedure and treatment not carried out due to patient leaving prior to being seen by health care provider: Secondary | ICD-10-CM | POA: Insufficient documentation

## 2020-07-10 LAB — I-STAT CHEM 8, ED
BUN: 14 mg/dL (ref 6–20)
Calcium, Ion: 1.19 mmol/L (ref 1.15–1.40)
Chloride: 107 mmol/L (ref 98–111)
Creatinine, Ser: 0.9 mg/dL (ref 0.44–1.00)
Glucose, Bld: 107 mg/dL — ABNORMAL HIGH (ref 70–99)
HCT: 30 % — ABNORMAL LOW (ref 36.0–46.0)
Hemoglobin: 10.2 g/dL — ABNORMAL LOW (ref 12.0–15.0)
Potassium: 3.8 mmol/L (ref 3.5–5.1)
Sodium: 139 mmol/L (ref 135–145)
TCO2: 22 mmol/L (ref 22–32)

## 2020-07-10 LAB — DIFFERENTIAL
Abs Immature Granulocytes: 0 10*3/uL (ref 0.00–0.07)
Basophils Absolute: 0 10*3/uL (ref 0.0–0.1)
Basophils Relative: 1 %
Eosinophils Absolute: 0.1 10*3/uL (ref 0.0–0.5)
Eosinophils Relative: 2 %
Immature Granulocytes: 0 %
Lymphocytes Relative: 35 %
Lymphs Abs: 1.7 10*3/uL (ref 0.7–4.0)
Monocytes Absolute: 0.3 10*3/uL (ref 0.1–1.0)
Monocytes Relative: 7 %
Neutro Abs: 2.7 10*3/uL (ref 1.7–7.7)
Neutrophils Relative %: 55 %

## 2020-07-10 LAB — CBC
HCT: 32 % — ABNORMAL LOW (ref 36.0–46.0)
Hemoglobin: 9.3 g/dL — ABNORMAL LOW (ref 12.0–15.0)
MCH: 22.6 pg — ABNORMAL LOW (ref 26.0–34.0)
MCHC: 29.1 g/dL — ABNORMAL LOW (ref 30.0–36.0)
MCV: 77.9 fL — ABNORMAL LOW (ref 80.0–100.0)
Platelets: 288 10*3/uL (ref 150–400)
RBC: 4.11 MIL/uL (ref 3.87–5.11)
RDW: 17.2 % — ABNORMAL HIGH (ref 11.5–15.5)
WBC: 4.9 10*3/uL (ref 4.0–10.5)
nRBC: 0 % (ref 0.0–0.2)

## 2020-07-10 LAB — PROTIME-INR
INR: 1 (ref 0.8–1.2)
Prothrombin Time: 13 seconds (ref 11.4–15.2)

## 2020-07-10 LAB — SARS CORONAVIRUS 2 (TAT 6-24 HRS): SARS Coronavirus 2: NEGATIVE

## 2020-07-10 LAB — COMPREHENSIVE METABOLIC PANEL
ALT: 11 U/L (ref 0–44)
AST: 12 U/L — ABNORMAL LOW (ref 15–41)
Albumin: 3.3 g/dL — ABNORMAL LOW (ref 3.5–5.0)
Alkaline Phosphatase: 53 U/L (ref 38–126)
Anion gap: 9 (ref 5–15)
BUN: 13 mg/dL (ref 6–20)
CO2: 21 mmol/L — ABNORMAL LOW (ref 22–32)
Calcium: 8.7 mg/dL — ABNORMAL LOW (ref 8.9–10.3)
Chloride: 106 mmol/L (ref 98–111)
Creatinine, Ser: 0.98 mg/dL (ref 0.44–1.00)
GFR, Estimated: >60 mL/min (ref >60)
Glucose, Bld: 109 mg/dL — ABNORMAL HIGH (ref 70–99)
Potassium: 3.9 mmol/L (ref 3.5–5.1)
Sodium: 136 mmol/L (ref 135–145)
Total Bilirubin: 0.5 mg/dL (ref 0.3–1.2)
Total Protein: 6.4 g/dL — ABNORMAL LOW (ref 6.5–8.1)

## 2020-07-10 LAB — APTT: aPTT: 29 seconds (ref 24–36)

## 2020-07-10 LAB — I-STAT BETA HCG BLOOD, ED (MC, WL, AP ONLY): I-stat hCG, quantitative: <5 m[IU]/mL (ref <5)

## 2020-07-10 LAB — CBG MONITORING, ED: Glucose-Capillary: 107 mg/dL — ABNORMAL HIGH (ref 70–99)

## 2020-07-10 IMAGING — CT CT HEAD W/O CM
4 series · 16 of 47 positions shown, 18 images · non-contrast
Comparison: None.

CLINICAL DATA: 44-year-old female with stroke suspected

EXAM:
CT HEAD WITHOUT CONTRAST
TECHNIQUE: Contiguous axial images were obtained from the base of the skull
through the vertex without intravenous contrast.

[Series 3: head wo · axial · 0.39mm/px · z∈[-148,-42]mm · 7 of 29 slices shown, 9 images]
[im 4/29  brain]
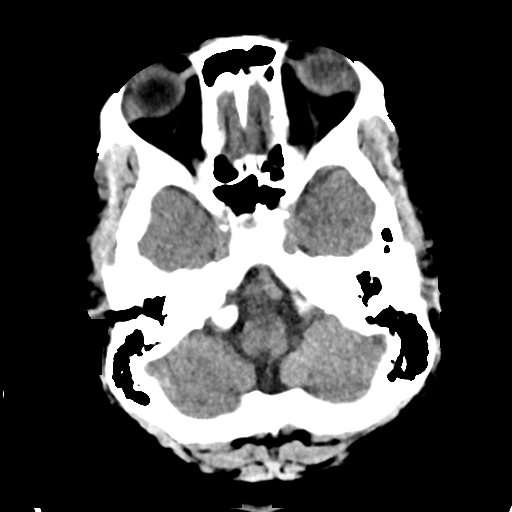
[im 4/29  bone]
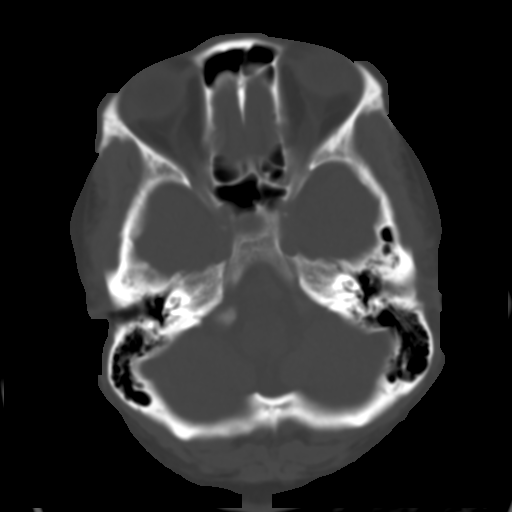
[im 8/29  brain]
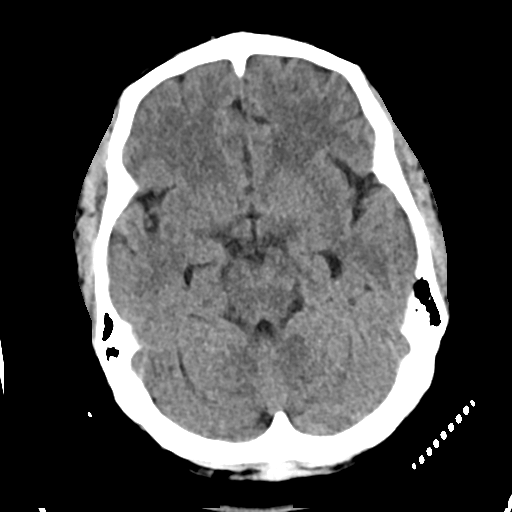
[im 11/29  brain]
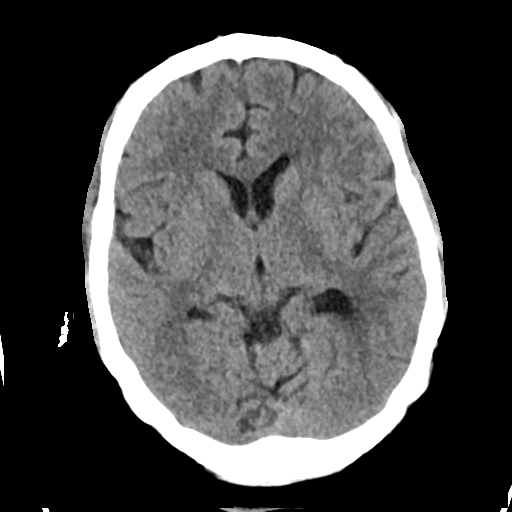
[im 15/29  brain]
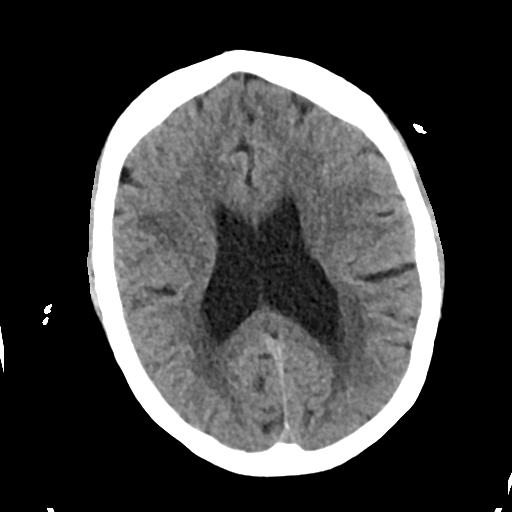
[im 18/29  brain]
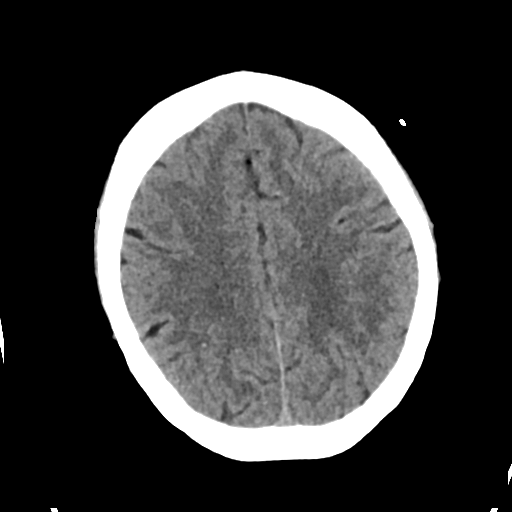
[im 18/29  bone]
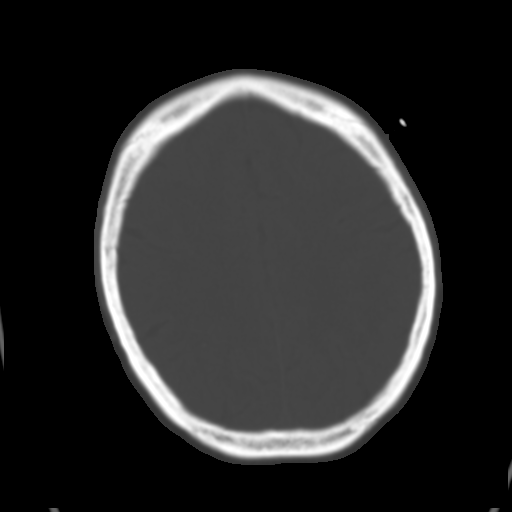
[im 22/29  brain]
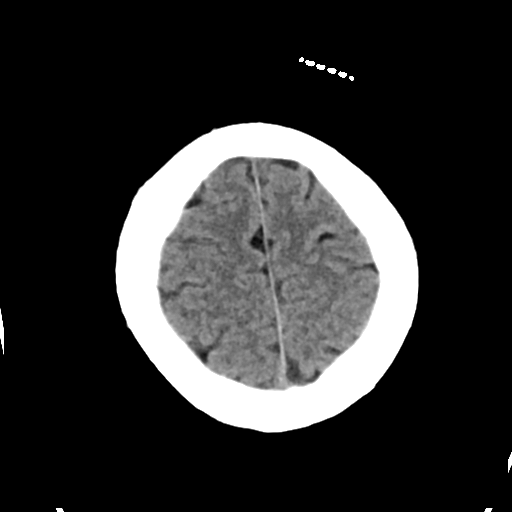
[im 25/29  brain]
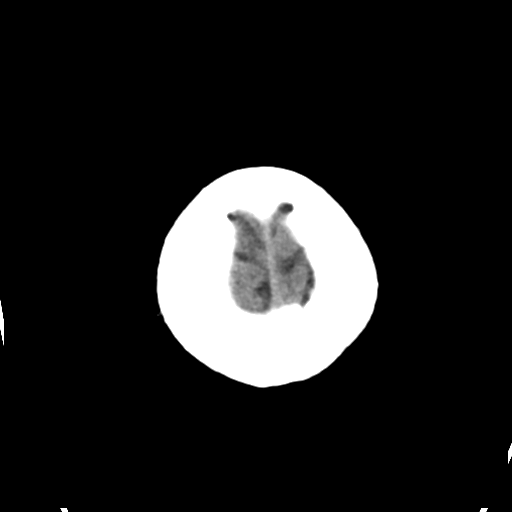

[Series 4: head bone · axial · 0.39mm/px · z∈[-148,-120]mm · 3 of 72 slices shown]
[im 8/72  bone]
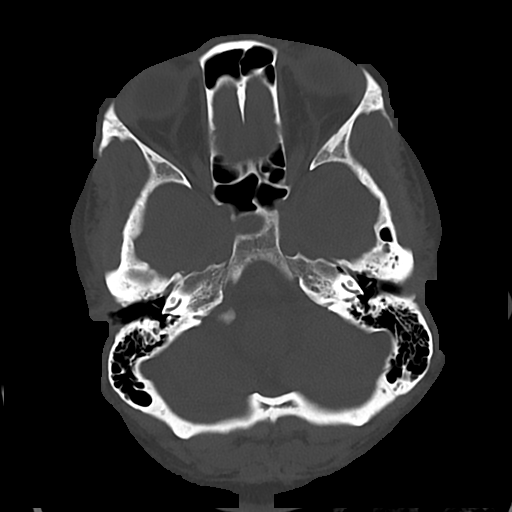
[im 15/72  bone]
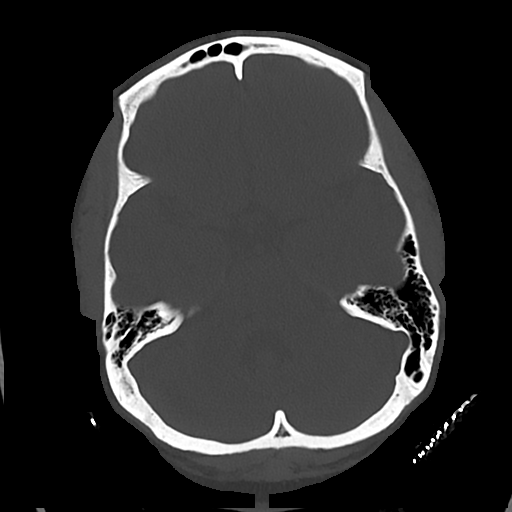
[im 22/72  bone]
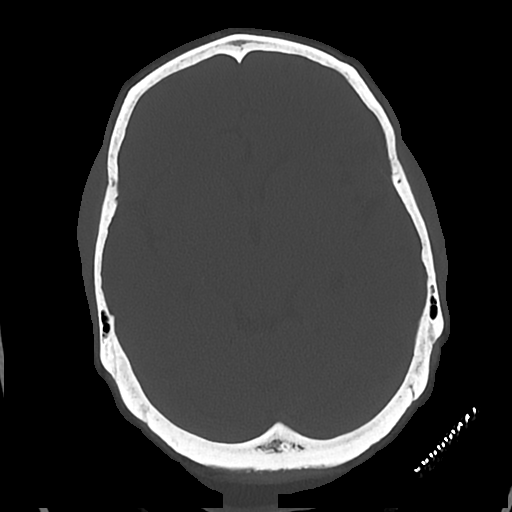

[Series 5: cor soft · coronal · 0.31mm/px · 3 of 65 slices shown]
[im 22/65  brain]
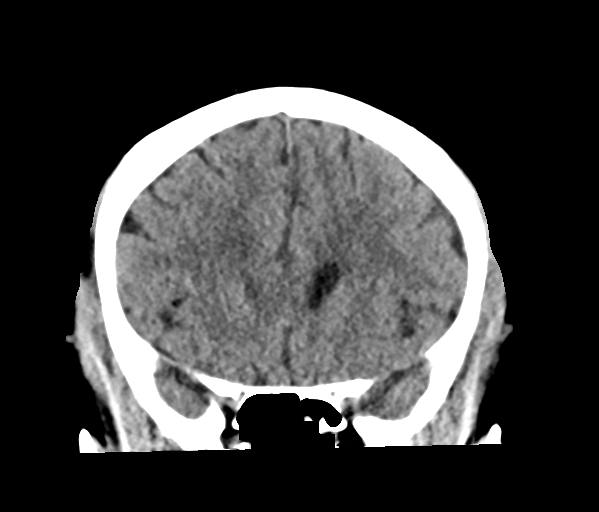
[im 29/65  brain]
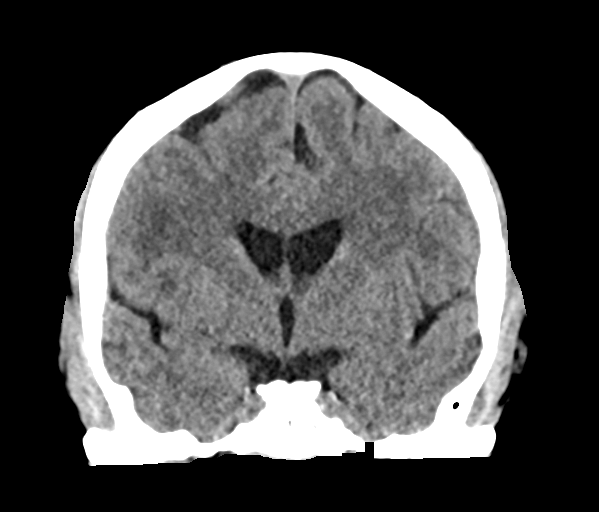
[im 36/65  brain]
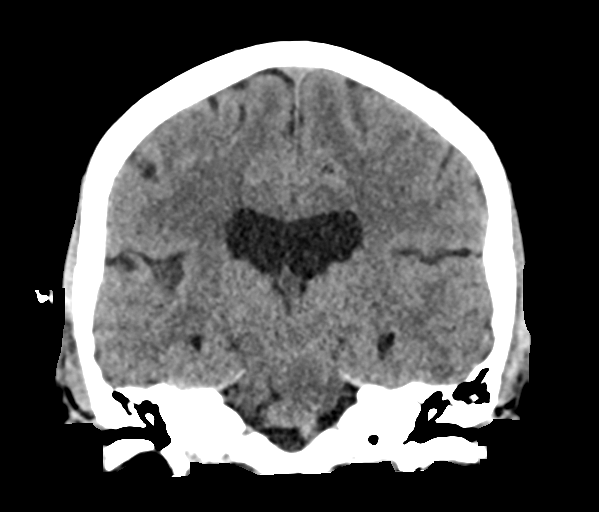

[Series 6: sag soft · sagittal · 0.31mm/px · 3 of 62 slices shown]
[im 21/62  brain]
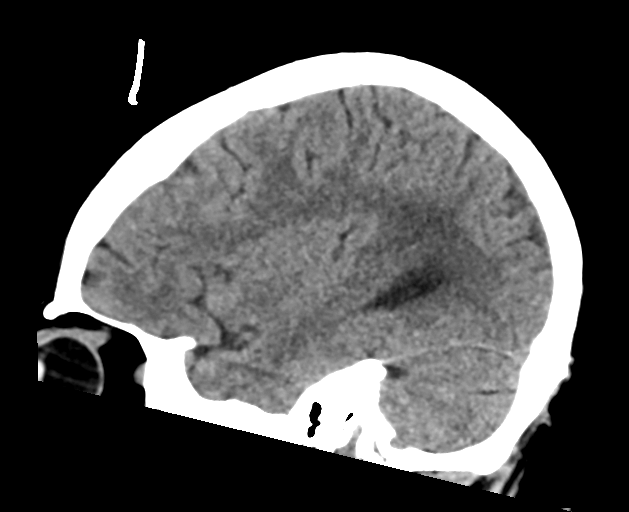
[im 31/62  brain]
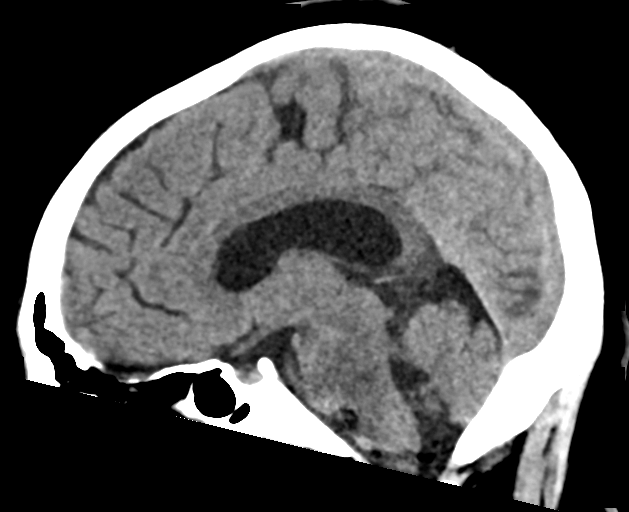
[im 41/62  brain]
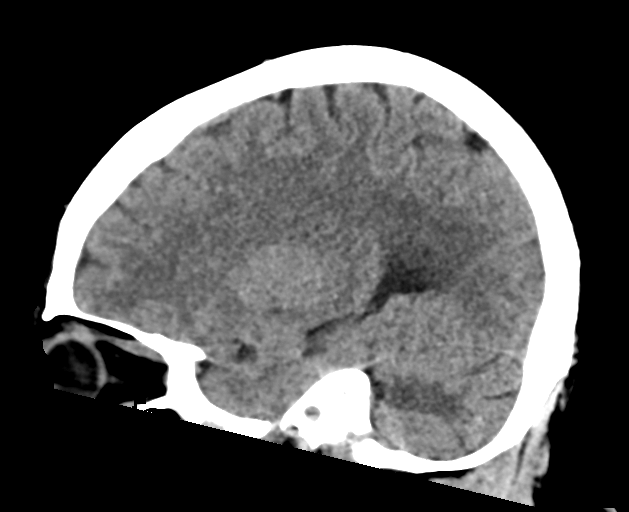

[16 of 47 positions shown; findings below may reference images not displayed]

FINDINGS: Brain: No acute intracranial hemorrhage. No midline shift or mass
effect. Gray-white differentiation maintained. Unremarkable
appearance of the ventricular system. Hypodensity in the right
cerebellar peduncle again demonstrated, compatible with prior
infarct. Sequela of prior infarct in the right occipital region
again demonstrated.

Vascular: Minimal intracranial atherosclerosis.

Skull: No acute fracture.  No aggressive bone lesion identified.

Sinuses/Orbits: Similar appearance of chronic right medial orbital
wall fracture with herniation of extraconal/orbital fat. No acute
bony abnormality. Unremarkable appearance of the paranasal sinuses.
Otherwise unremarkable appearance of the orbits.

Other: None
IMPRESSION: Negative for acute intracranial abnormality.

## 2020-07-10 MED ORDER — SODIUM CHLORIDE 0.9% FLUSH: 3.0000 mL | Freq: Once | INTRAVENOUS | Status: DC

## 2020-07-10 NOTE — ED Triage Notes (Signed)
Pt arrived via ems and here with new left sided numbness to entire left side for last 3 days (Had previous stroke which left her with left facial droop, slurred speech, and left sided weakness).  Pt also reports new chest pain since last night that radiates to back and under breast.  Pt has cough too and chest hurts with deep breath.

## 2020-07-10 NOTE — ED Notes (Signed)
Patient called by registration and clinical staff with no response

## 2020-08-28 IMAGING — CR DG ABDOMEN 2V
3 series · 3 of 3 positions shown · non-contrast
Comparison: None.

CLINICAL DATA: Constipation.

EXAM:
ABDOMEN - 2 VIEW

[t abdomen supine (1 of 2)]
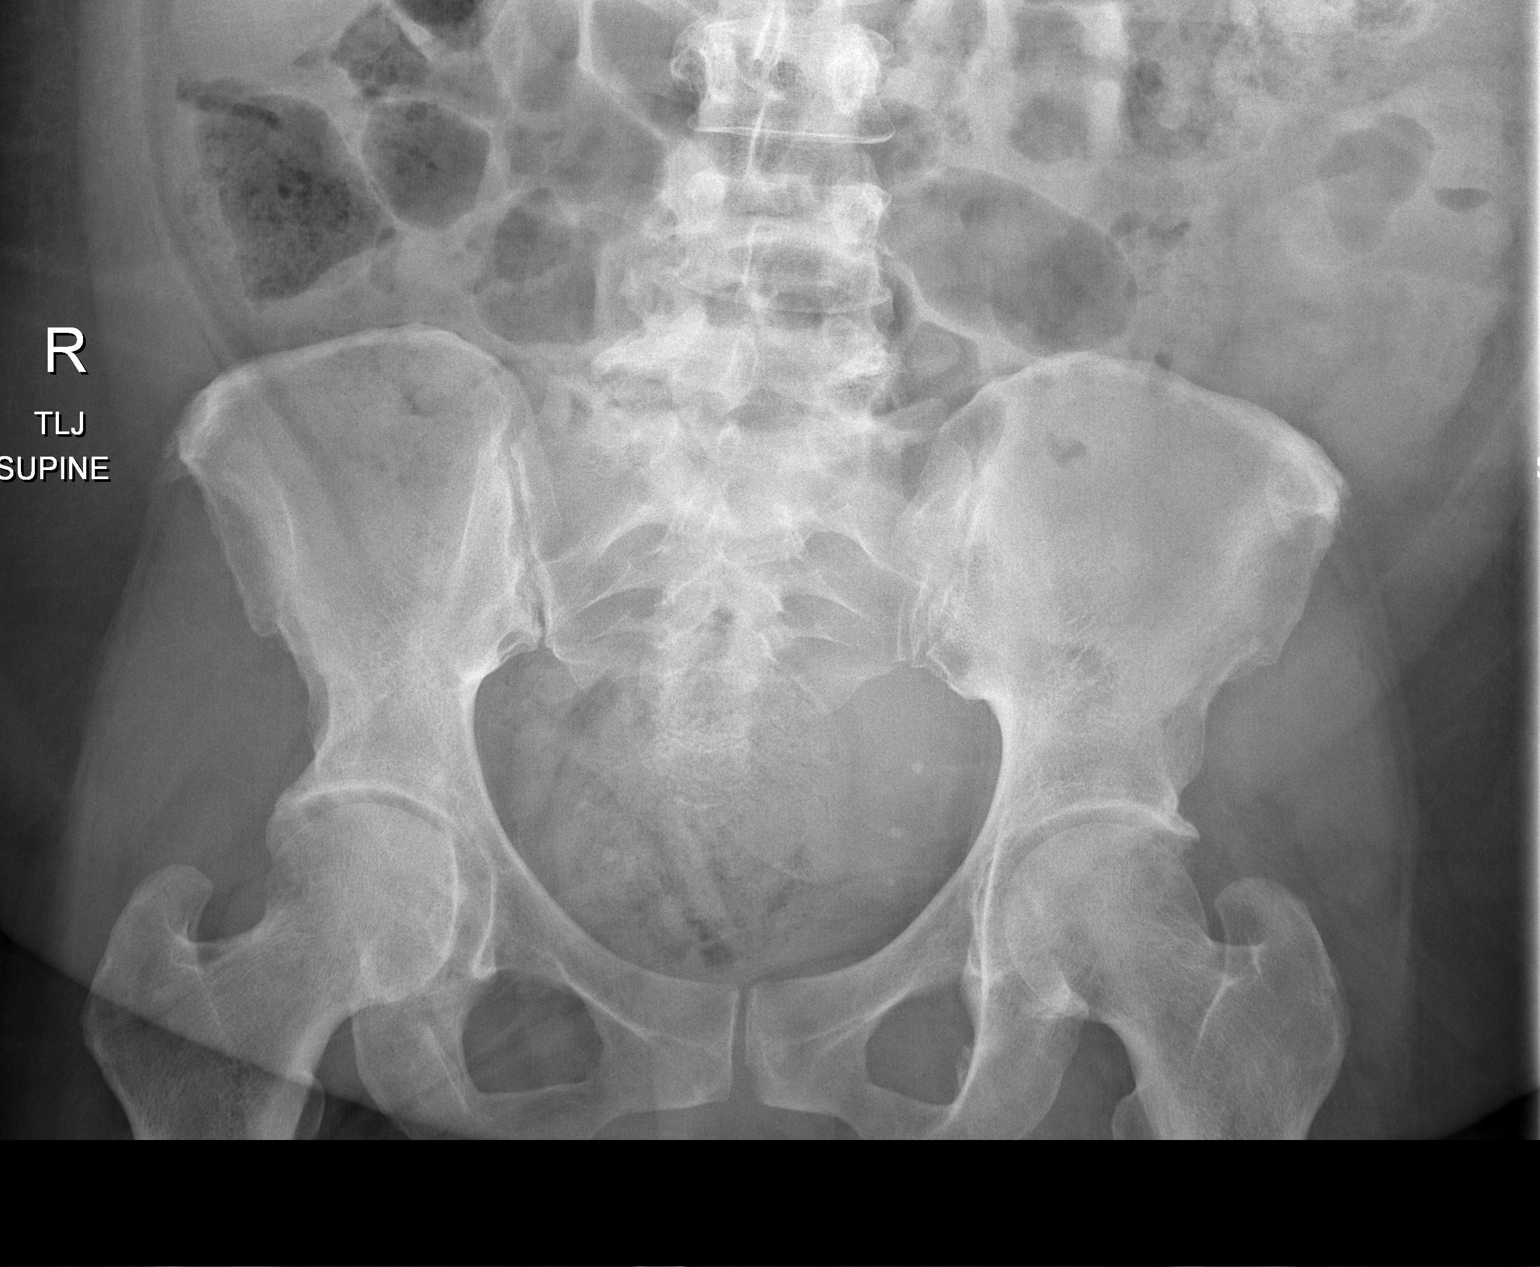

[t abdomen supine (2 of 2)]
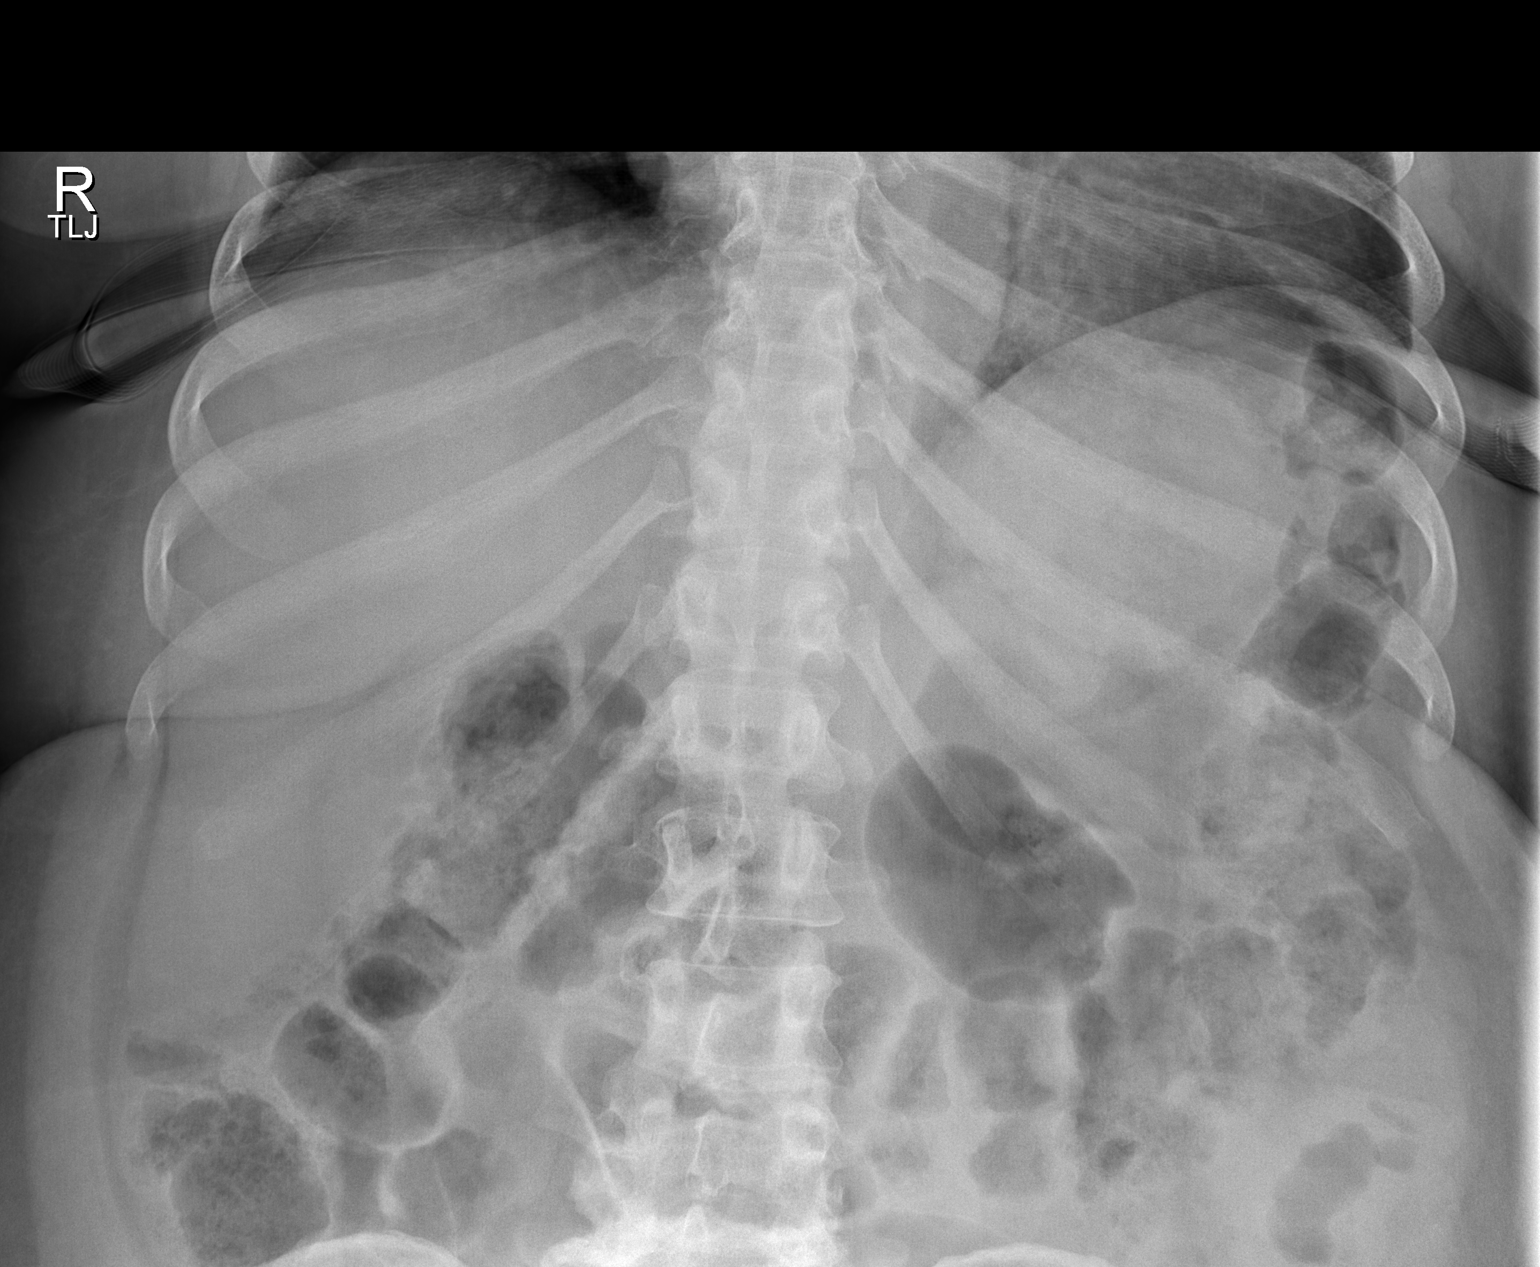

[x abdomen erect]
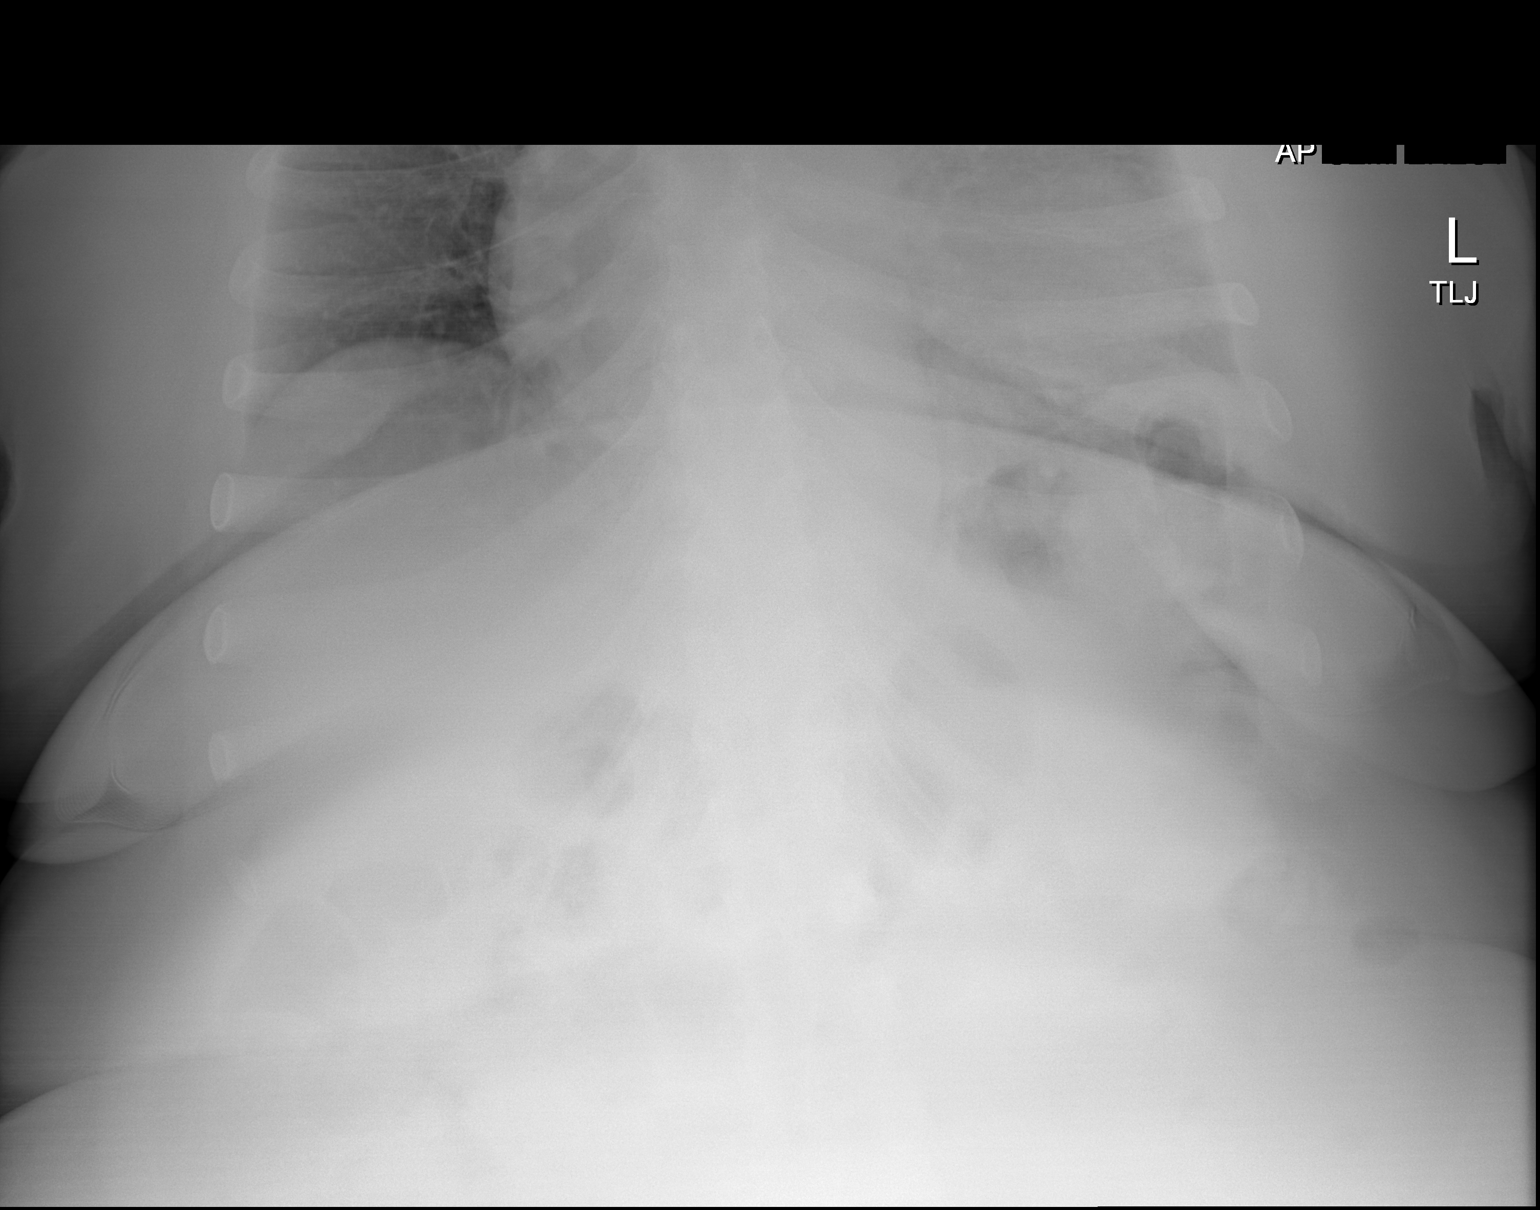

[3 of 3 positions shown; findings below may reference images not displayed]

FINDINGS: The bowel gas pattern is normal. Moderate amount of retained
colorectal stool suggesting constipation. There is no evidence of
free air. No radio-opaque calculi or other significant radiographic
abnormality is seen.
IMPRESSION: Moderate amount of retained colorectal suggesting constipation.

## 2020-09-06 ENCOUNTER — Emergency Department (HOSPITAL_COMMUNITY): Payer: Medicaid Other

## 2020-09-06 ENCOUNTER — Inpatient Hospital Stay (HOSPITAL_COMMUNITY)
Admission: EM | Admit: 2020-09-06 | Discharge: 2020-09-14 | DRG: 948 | Disposition: A | Discharge status: Home / Self Care | Payer: Medicaid Other | Attending: Family Medicine | Admitting: Family Medicine

## 2020-09-06 ENCOUNTER — Inpatient Hospital Stay (HOSPITAL_COMMUNITY): Payer: Medicaid Other

## 2020-09-06 DIAGNOSIS — I69392 Facial weakness following cerebral infarction: Secondary | ICD-10-CM

## 2020-09-06 DIAGNOSIS — Z20822 Contact with and (suspected) exposure to covid-19: Secondary | ICD-10-CM | POA: Diagnosis present

## 2020-09-06 DIAGNOSIS — I639 Cerebral infarction, unspecified: Secondary | ICD-10-CM | POA: Diagnosis present

## 2020-09-06 DIAGNOSIS — F17210 Nicotine dependence, cigarettes, uncomplicated: Secondary | ICD-10-CM | POA: Diagnosis present

## 2020-09-06 DIAGNOSIS — D649 Anemia, unspecified: Secondary | ICD-10-CM | POA: Diagnosis present

## 2020-09-06 DIAGNOSIS — Z885 Allergy status to narcotic agent status: Secondary | ICD-10-CM | POA: Diagnosis not present

## 2020-09-06 DIAGNOSIS — Z6837 Body mass index (BMI) 37.0-37.9, adult: Secondary | ICD-10-CM

## 2020-09-06 DIAGNOSIS — Z5900 Homelessness unspecified: Secondary | ICD-10-CM

## 2020-09-06 DIAGNOSIS — D509 Iron deficiency anemia, unspecified: Secondary | ICD-10-CM | POA: Diagnosis present

## 2020-09-06 DIAGNOSIS — Z8673 Personal history of transient ischemic attack (TIA), and cerebral infarction without residual deficits: Secondary | ICD-10-CM | POA: Diagnosis not present

## 2020-09-06 DIAGNOSIS — Z9119 Patient's noncompliance with other medical treatment and regimen: Secondary | ICD-10-CM

## 2020-09-06 DIAGNOSIS — I10 Essential (primary) hypertension: Secondary | ICD-10-CM | POA: Diagnosis present

## 2020-09-06 DIAGNOSIS — Y929 Unspecified place or not applicable: Secondary | ICD-10-CM

## 2020-09-06 DIAGNOSIS — R200 Anesthesia of skin: Secondary | ICD-10-CM | POA: Diagnosis present

## 2020-09-06 DIAGNOSIS — G35 Multiple sclerosis: Secondary | ICD-10-CM | POA: Diagnosis present

## 2020-09-06 DIAGNOSIS — F1210 Cannabis abuse, uncomplicated: Secondary | ICD-10-CM | POA: Diagnosis present

## 2020-09-06 DIAGNOSIS — F445 Conversion disorder with seizures or convulsions: Secondary | ICD-10-CM | POA: Diagnosis not present

## 2020-09-06 DIAGNOSIS — Z7982 Long term (current) use of aspirin: Secondary | ICD-10-CM | POA: Diagnosis not present

## 2020-09-06 DIAGNOSIS — Z79899 Other long term (current) drug therapy: Secondary | ICD-10-CM

## 2020-09-06 DIAGNOSIS — F319 Bipolar disorder, unspecified: Secondary | ICD-10-CM | POA: Diagnosis present

## 2020-09-06 DIAGNOSIS — Z91040 Latex allergy status: Secondary | ICD-10-CM

## 2020-09-06 DIAGNOSIS — E119 Type 2 diabetes mellitus without complications: Secondary | ICD-10-CM | POA: Diagnosis present

## 2020-09-06 DIAGNOSIS — T43226A Underdosing of selective serotonin reuptake inhibitors, initial encounter: Secondary | ICD-10-CM | POA: Diagnosis present

## 2020-09-06 DIAGNOSIS — Z91128 Patient's intentional underdosing of medication regimen for other reason: Secondary | ICD-10-CM | POA: Diagnosis not present

## 2020-09-06 DIAGNOSIS — I152 Hypertension secondary to endocrine disorders: Secondary | ICD-10-CM | POA: Diagnosis present

## 2020-09-06 DIAGNOSIS — I69354 Hemiplegia and hemiparesis following cerebral infarction affecting left non-dominant side: Secondary | ICD-10-CM | POA: Diagnosis not present

## 2020-09-06 DIAGNOSIS — Z8249 Family history of ischemic heart disease and other diseases of the circulatory system: Secondary | ICD-10-CM

## 2020-09-06 DIAGNOSIS — R531 Weakness: Principal | ICD-10-CM | POA: Diagnosis present

## 2020-09-06 DIAGNOSIS — E785 Hyperlipidemia, unspecified: Secondary | ICD-10-CM | POA: Diagnosis present

## 2020-09-06 DIAGNOSIS — T43596A Underdosing of other antipsychotics and neuroleptics, initial encounter: Secondary | ICD-10-CM | POA: Diagnosis present

## 2020-09-06 DIAGNOSIS — Z794 Long term (current) use of insulin: Secondary | ICD-10-CM

## 2020-09-06 DIAGNOSIS — F1410 Cocaine abuse, uncomplicated: Secondary | ICD-10-CM | POA: Diagnosis present

## 2020-09-06 DIAGNOSIS — F250 Schizoaffective disorder, bipolar type: Secondary | ICD-10-CM | POA: Diagnosis present

## 2020-09-06 DIAGNOSIS — I6389 Other cerebral infarction: Secondary | ICD-10-CM | POA: Diagnosis not present

## 2020-09-06 LAB — URINALYSIS, ROUTINE W REFLEX MICROSCOPIC
Glucose, UA: NEGATIVE mg/dL
Hgb urine dipstick: NEGATIVE
Ketones, ur: NEGATIVE mg/dL
Leukocytes,Ua: NEGATIVE
Nitrite: NEGATIVE
Protein, ur: >=300 mg/dL — AB
Specific Gravity, Urine: 1.025 (ref 1.005–1.030)
pH: 9 — ABNORMAL HIGH (ref 5.0–8.0)

## 2020-09-06 LAB — I-STAT CHEM 8, ED
BUN: 11 mg/dL (ref 6–20)
Calcium, Ion: 1.13 mmol/L — ABNORMAL LOW (ref 1.15–1.40)
Chloride: 105 mmol/L (ref 98–111)
Creatinine, Ser: 0.9 mg/dL (ref 0.44–1.00)
Glucose, Bld: 139 mg/dL — ABNORMAL HIGH (ref 70–99)
HCT: 31 % — ABNORMAL LOW (ref 36.0–46.0)
Hemoglobin: 10.5 g/dL — ABNORMAL LOW (ref 12.0–15.0)
Potassium: 3.6 mmol/L (ref 3.5–5.1)
Sodium: 142 mmol/L (ref 135–145)
TCO2: 27 mmol/L (ref 22–32)

## 2020-09-06 LAB — ETHANOL: Alcohol, Ethyl (B): <10 mg/dL (ref <10)

## 2020-09-06 LAB — DIFFERENTIAL
Abs Immature Granulocytes: 0 10*3/uL (ref 0.00–0.07)
Basophils Absolute: 0 10*3/uL (ref 0.0–0.1)
Basophils Relative: 1 %
Eosinophils Absolute: 0.1 10*3/uL (ref 0.0–0.5)
Eosinophils Relative: 2 %
Immature Granulocytes: 0 %
Lymphocytes Relative: 37 %
Lymphs Abs: 1.5 10*3/uL (ref 0.7–4.0)
Monocytes Absolute: 0.3 10*3/uL (ref 0.1–1.0)
Monocytes Relative: 7 %
Neutro Abs: 2.1 10*3/uL (ref 1.7–7.7)
Neutrophils Relative %: 53 %

## 2020-09-06 LAB — COMPREHENSIVE METABOLIC PANEL
ALT: 9 U/L (ref 0–44)
AST: 17 U/L (ref 15–41)
Albumin: 3.4 g/dL — ABNORMAL LOW (ref 3.5–5.0)
Alkaline Phosphatase: 59 U/L (ref 38–126)
Anion gap: 8 (ref 5–15)
BUN: 10 mg/dL (ref 6–20)
CO2: 25 mmol/L (ref 22–32)
Calcium: 8.6 mg/dL — ABNORMAL LOW (ref 8.9–10.3)
Chloride: 107 mmol/L (ref 98–111)
Creatinine, Ser: 0.93 mg/dL (ref 0.44–1.00)
GFR, Estimated: >60 mL/min (ref >60)
Glucose, Bld: 137 mg/dL — ABNORMAL HIGH (ref 70–99)
Potassium: 3.3 mmol/L — ABNORMAL LOW (ref 3.5–5.1)
Sodium: 140 mmol/L (ref 135–145)
Total Bilirubin: 0.4 mg/dL (ref 0.3–1.2)
Total Protein: 6.7 g/dL (ref 6.5–8.1)

## 2020-09-06 LAB — RESP PANEL BY RT-PCR (FLU A&B, COVID) ARPGX2
Influenza A by PCR: NEGATIVE
Influenza B by PCR: NEGATIVE
SARS Coronavirus 2 by RT PCR: NEGATIVE

## 2020-09-06 LAB — PROTIME-INR
INR: 1.1 (ref 0.8–1.2)
Prothrombin Time: 13.3 seconds (ref 11.4–15.2)

## 2020-09-06 LAB — CBC
HCT: 32.8 % — ABNORMAL LOW (ref 36.0–46.0)
Hemoglobin: 9.5 g/dL — ABNORMAL LOW (ref 12.0–15.0)
MCH: 22.7 pg — ABNORMAL LOW (ref 26.0–34.0)
MCHC: 29 g/dL — ABNORMAL LOW (ref 30.0–36.0)
MCV: 78.5 fL — ABNORMAL LOW (ref 80.0–100.0)
Platelets: 321 10*3/uL (ref 150–400)
RBC: 4.18 MIL/uL (ref 3.87–5.11)
RDW: 18 % — ABNORMAL HIGH (ref 11.5–15.5)
WBC: 4 10*3/uL (ref 4.0–10.5)
nRBC: 0 % (ref 0.0–0.2)

## 2020-09-06 LAB — GLUCOSE, CAPILLARY
Glucose-Capillary: 128 mg/dL — ABNORMAL HIGH (ref 70–99)
Glucose-Capillary: 146 mg/dL — ABNORMAL HIGH (ref 70–99)

## 2020-09-06 LAB — APTT: aPTT: 29 seconds (ref 24–36)

## 2020-09-06 LAB — RAPID URINE DRUG SCREEN, HOSP PERFORMED
Amphetamines: NOT DETECTED
Barbiturates: NOT DETECTED
Benzodiazepines: POSITIVE — AB
Cocaine: POSITIVE — AB
Opiates: NOT DETECTED
Tetrahydrocannabinol: POSITIVE — AB

## 2020-09-06 LAB — I-STAT BETA HCG BLOOD, ED (MC, WL, AP ONLY): I-stat hCG, quantitative: <5 m[IU]/mL (ref <5)

## 2020-09-06 LAB — HEMOGLOBIN A1C
Hgb A1c MFr Bld: 7.5 % — ABNORMAL HIGH (ref 4.8–5.6)
Mean Plasma Glucose: 168.55 mg/dL

## 2020-09-06 LAB — TROPONIN I (HIGH SENSITIVITY)
Troponin I (High Sensitivity): 7 ng/L (ref <18)
Troponin I (High Sensitivity): 6 ng/L (ref <18)

## 2020-09-06 LAB — CBG MONITORING, ED: Glucose-Capillary: 129 mg/dL — ABNORMAL HIGH (ref 70–99)

## 2020-09-06 IMAGING — MR MR HEAD W/O CM
2 series · 48 of 48 positions shown · non-contrast
Comparison: Prior CTs from earlier the same day.

CLINICAL DATA: Initial evaluation for possible stroke.

EXAM:
MRI HEAD WITHOUT CONTRAST
TECHNIQUE: Multiplanar, multiecho pulse sequences of the brain and surrounding
structures were obtained without intravenous contrast.

[Series 4: DWI · axial · 3.0mm · 1.09mm/px · z∈[-151,-22]mm · 32 of 94 slices shown (1 of 2)]
[im 1/94]
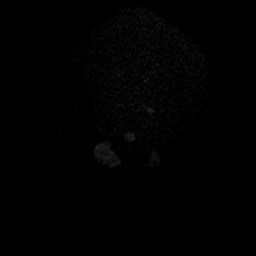
[im 4/94]
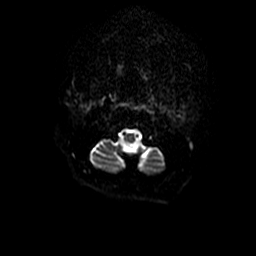
[im 7/94]
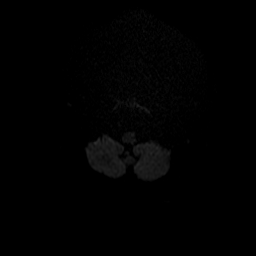
[im 10/94]
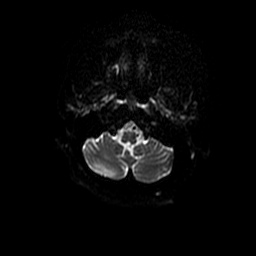
[im 13/94]
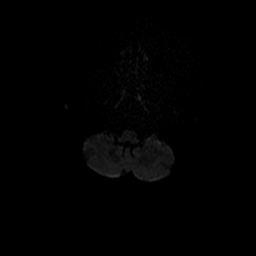
[im 16/94]
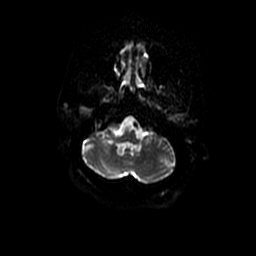
[im 19/94]
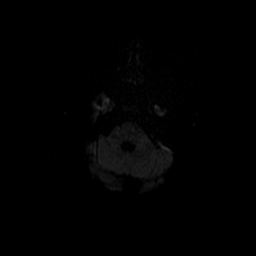
[im 22/94]
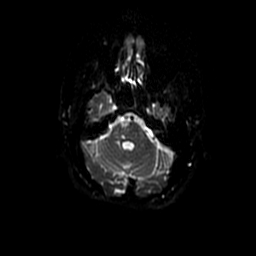
[im 25/94]
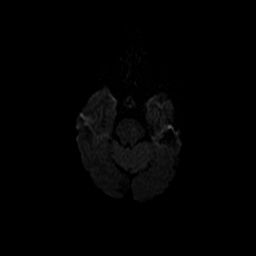
[im 28/94]
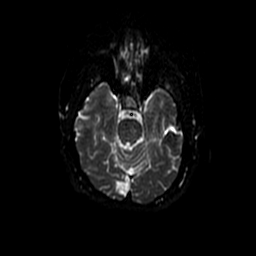
[im 31/94]
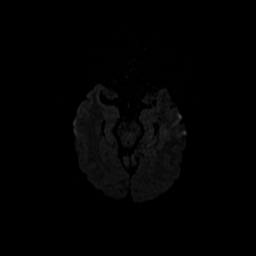
[im 34/94]
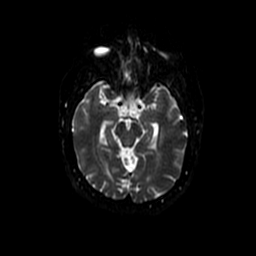
[im 37/94]
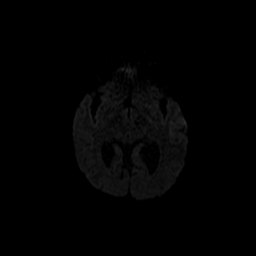
[im 40/94]
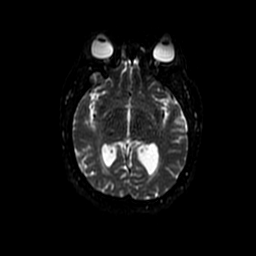
[im 43/94]
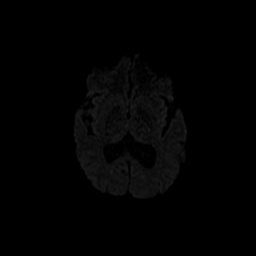
[im 46/94]
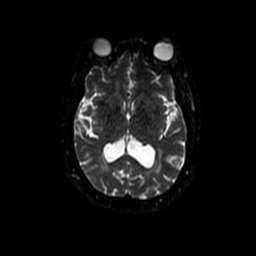
[im 49/94]
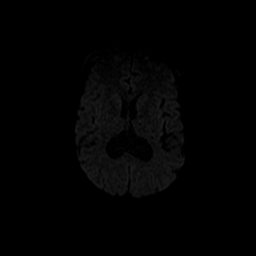
[im 52/94]
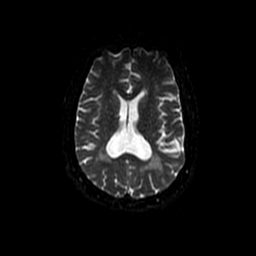
[im 55/94]
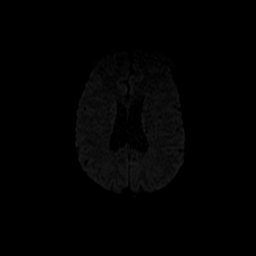
[im 58/94]
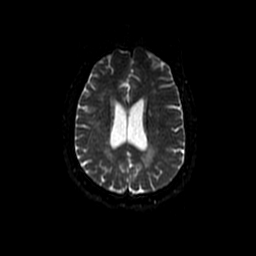
[im 61/94]
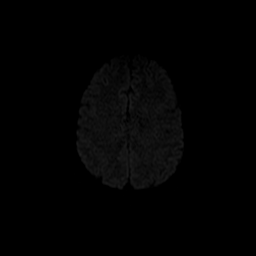
[im 64/94]
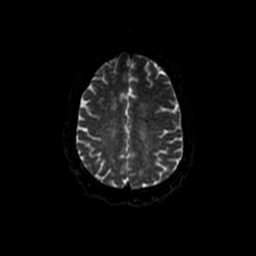
[im 67/94]
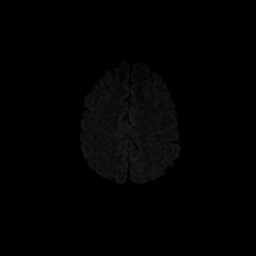
[im 70/94]
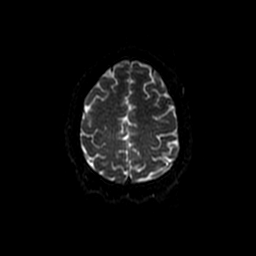
[im 73/94]
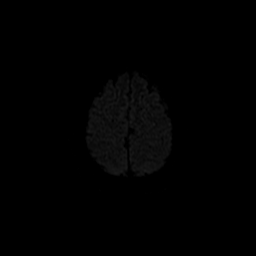
[im 76/94]
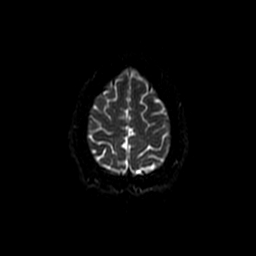
[im 79/94]
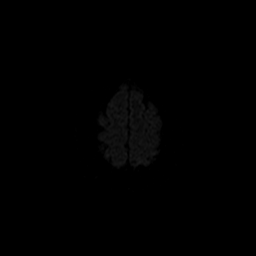
[im 82/94]
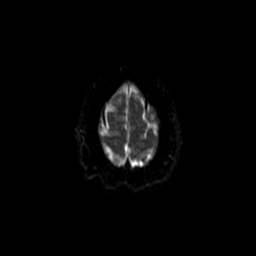
[im 85/94]
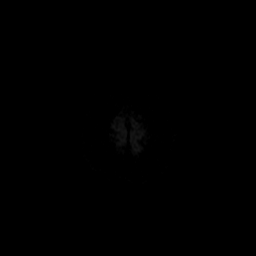
[im 88/94]
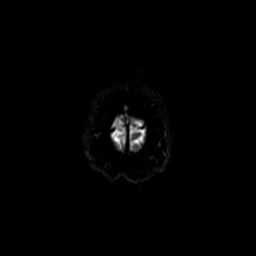
[im 91/94]
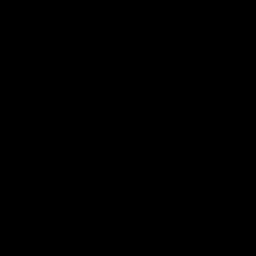
[im 94/94]
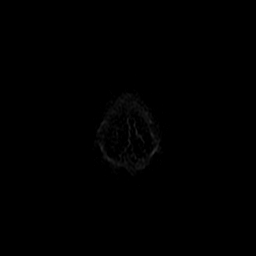

[Series 400: DWI · axial · 3.0mm · 1.09mm/px · z∈[-151,-22]mm · 16 of 47 slices shown (2 of 2)]
[im 1/47]
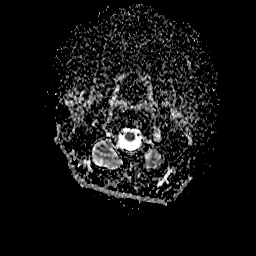
[im 4/47]
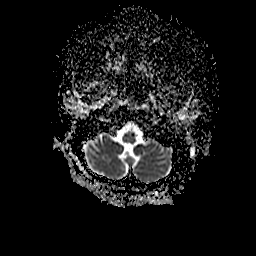
[im 7/47]
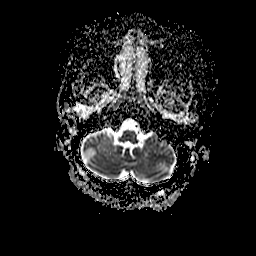
[im 10/47]
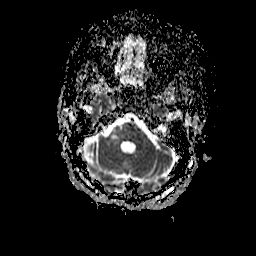
[im 13/47]
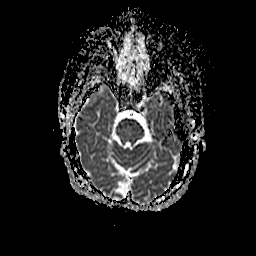
[im 16/47]
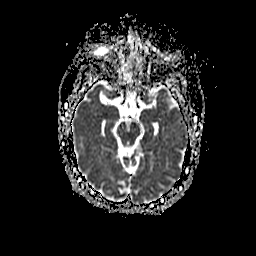
[im 19/47]
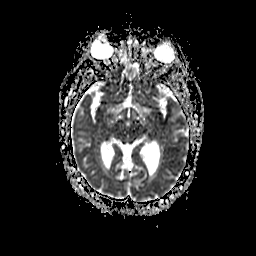
[im 22/47]
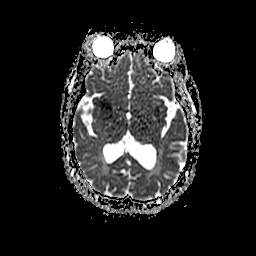
[im 25/47]
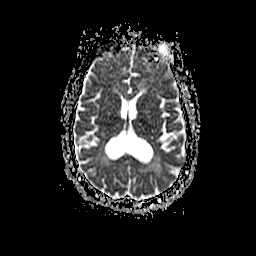
[im 28/47]
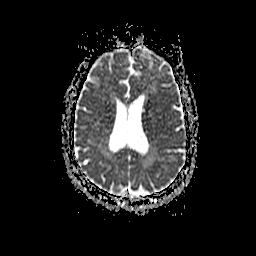
[im 31/47]
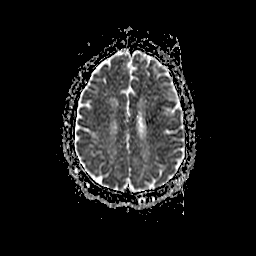
[im 34/47]
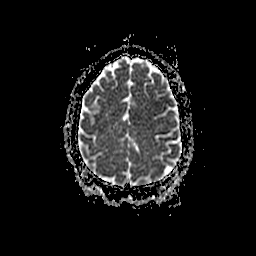
[im 37/47]
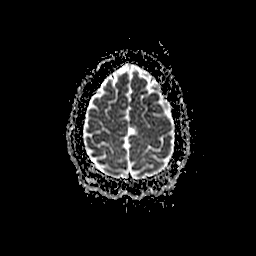
[im 40/47]
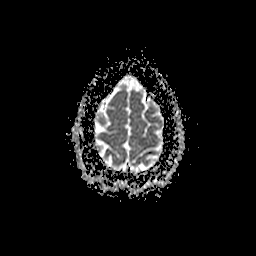
[im 43/47]
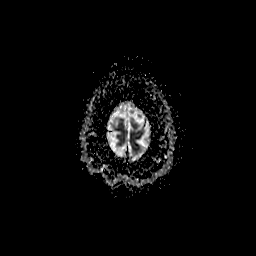
[im 47/47]
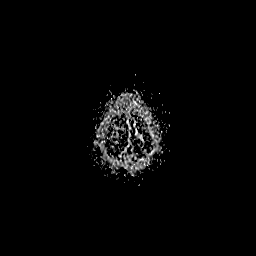

[48 of 48 positions shown; findings below may reference images not displayed]

FINDINGS: Brain: Examination technically limited as the patient was unable to
tolerate the full length of the study. Axial DWI sequence only was
performed.

Diffusion-weighted sequence demonstrates no evidence for acute or
subacute ischemia. Chronic right PCA distribution infarct noted,
stable. Gray-white matter differentiation otherwise grossly
maintained. No visible mass lesion, mass effect or midline shift. No
hydrocephalus or extra-axial fluid collection.

Vascular: Not assessed on this limited exam.

Skull and upper cervical spine: Not assessed on this limited exam.

Sinuses/Orbits: Not assessed on this limited exam.

Other: Negative.
IMPRESSION: 1. Technically limited exam due to the patient's inability to
tolerate the full length of the study. Axial DWI sequence only was
performed.
2. No evidence for acute or subacute ischemia.
3. Chronic right PCA distribution infarct, stable.

## 2020-09-06 IMAGING — CT CT HEAD CODE STROKE
3 series · 15 of 47 positions shown, 18 images · non-contrast
Comparison: CT head [DATE]

CLINICAL DATA: Code stroke. Acute neuro deficit. Slurred speech and
left arm weakness and numbness

EXAM:
CT HEAD WITHOUT CONTRAST
TECHNIQUE: Contiguous axial images were obtained from the base of the skull
through the vertex without intravenous contrast.

[Series 4: head 5.0 st · axial · 0.43mm/px · z∈[+1158,+1282]mm · 9 of 31 slices shown, 12 images]
[im 3/31  brain]
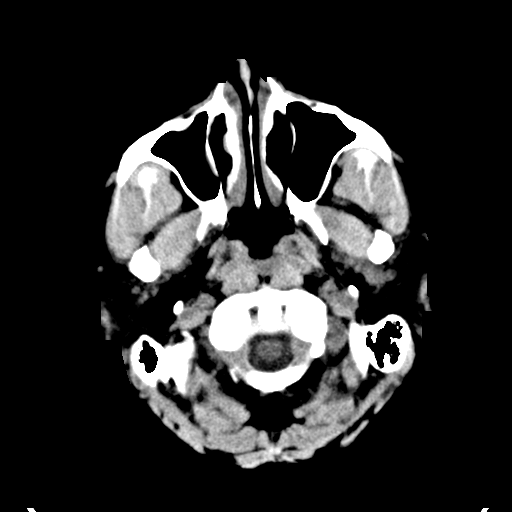
[im 3/31  bone]
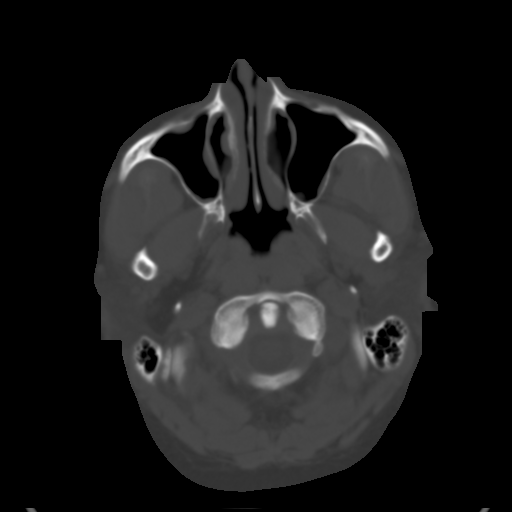
[im 6/31  brain]
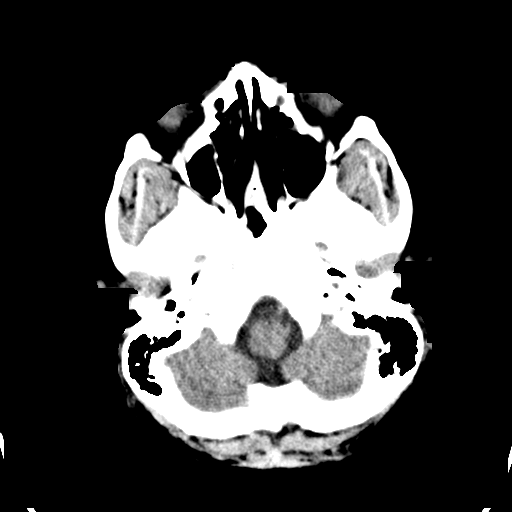
[im 9/31  brain]
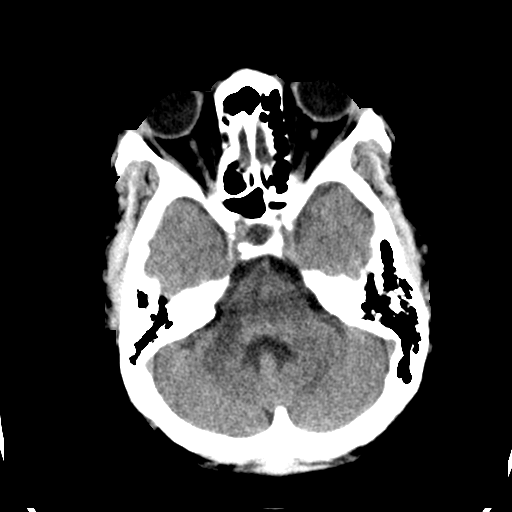
[im 12/31  brain]
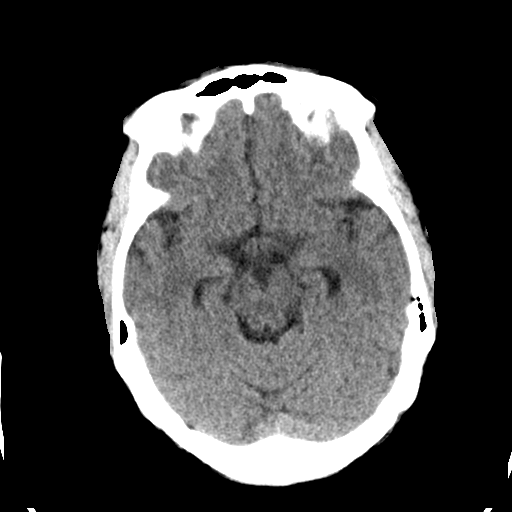
[im 16/31  brain]
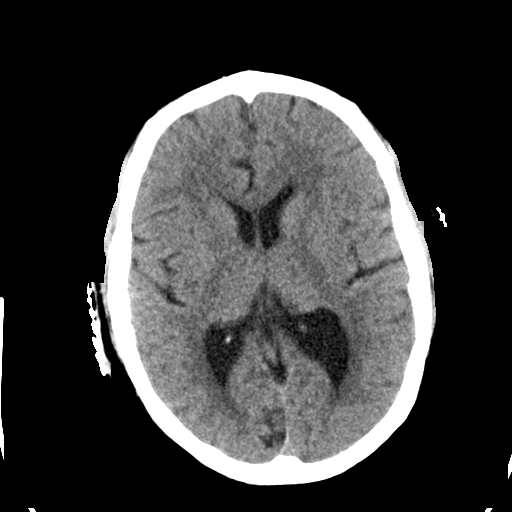
[im 16/31  bone]
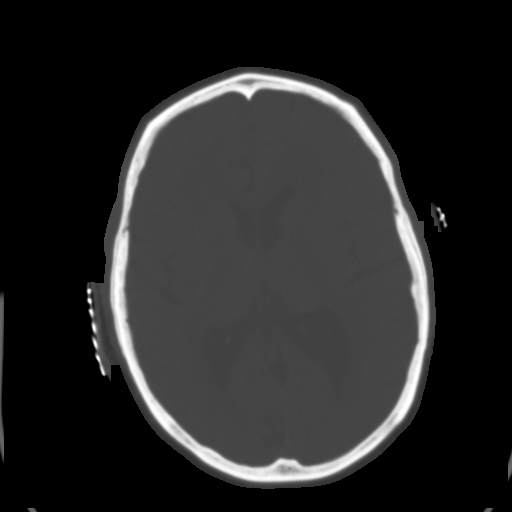
[im 19/31  brain]
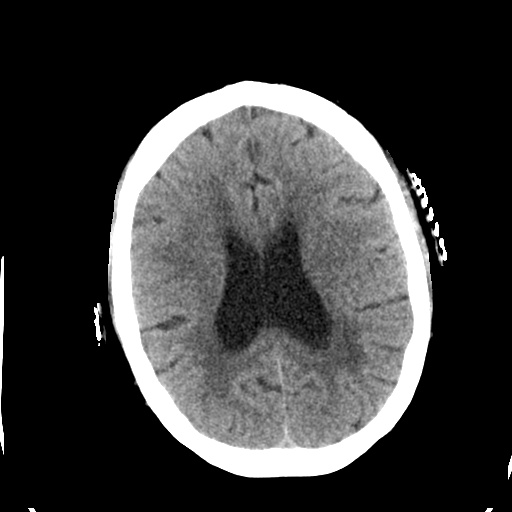
[im 22/31  brain]
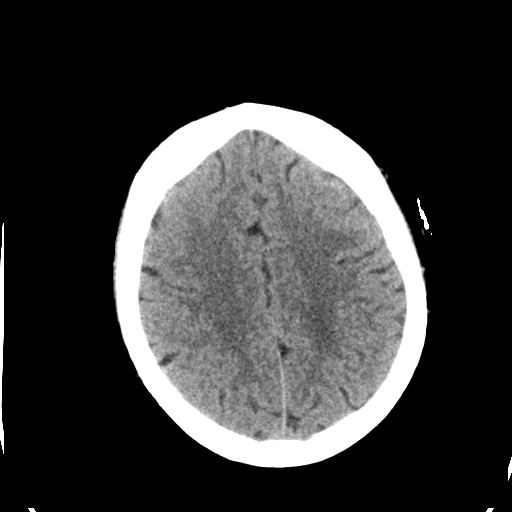
[im 25/31  brain]
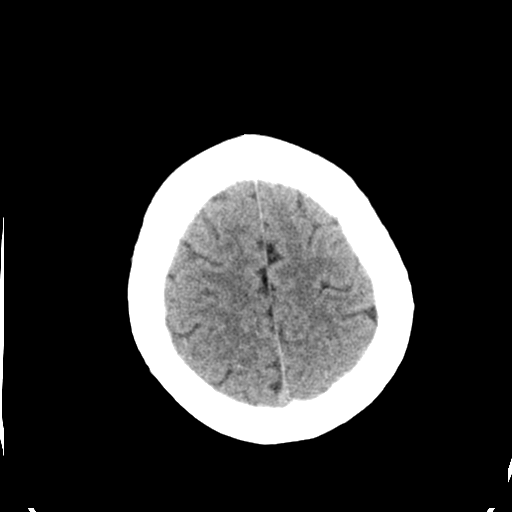
[im 28/31  brain]
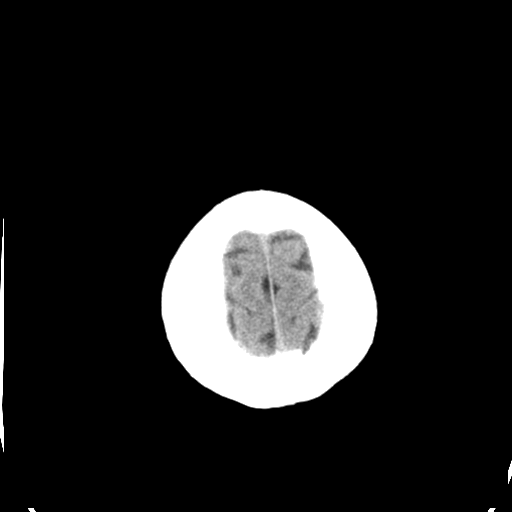
[im 28/31  bone]
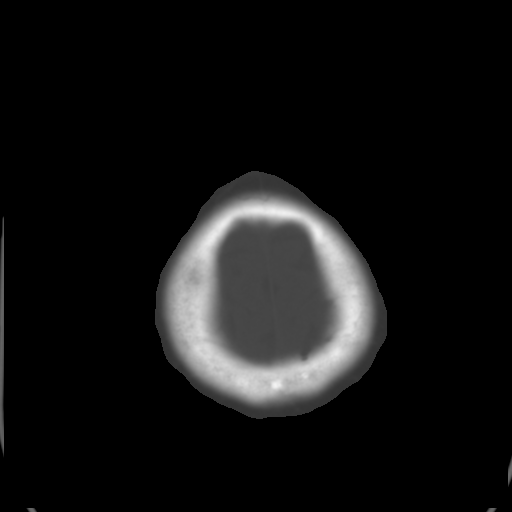

[Series 5: head 3.0 cor st · coronal · 0.32mm/px · 3 of 72 slices shown]
[im 24/72  brain]
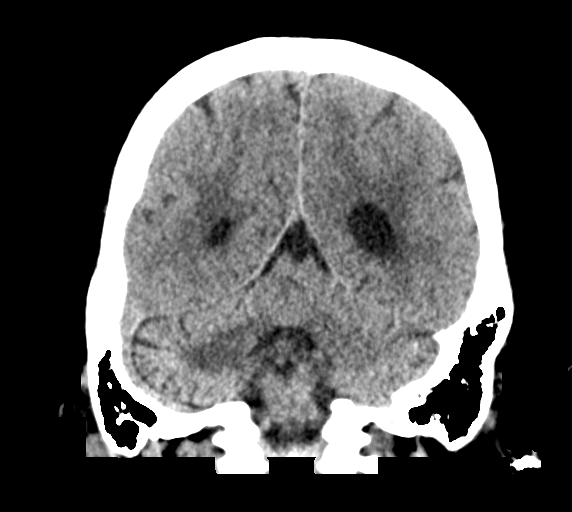
[im 32/72  brain]
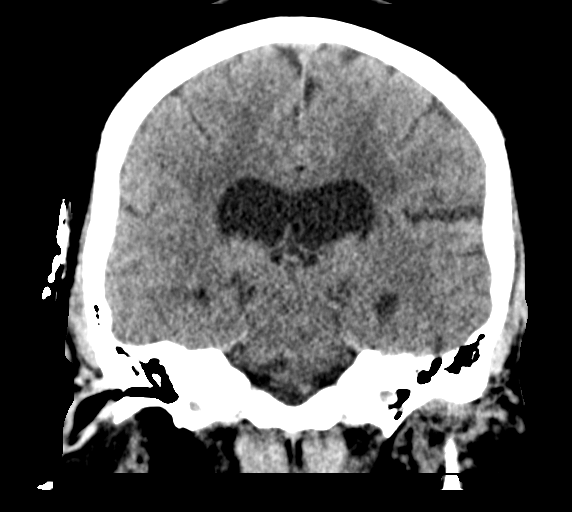
[im 40/72  brain]
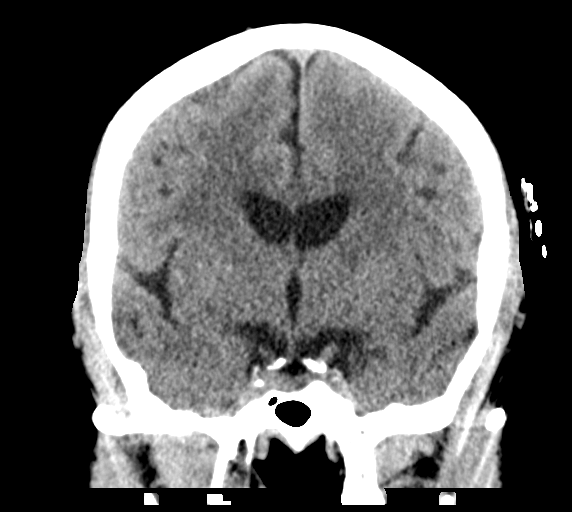

[Series 6: head 3.0 sag st · sagittal · 0.35mm/px · 3 of 66 slices shown]
[im 22/66  brain]
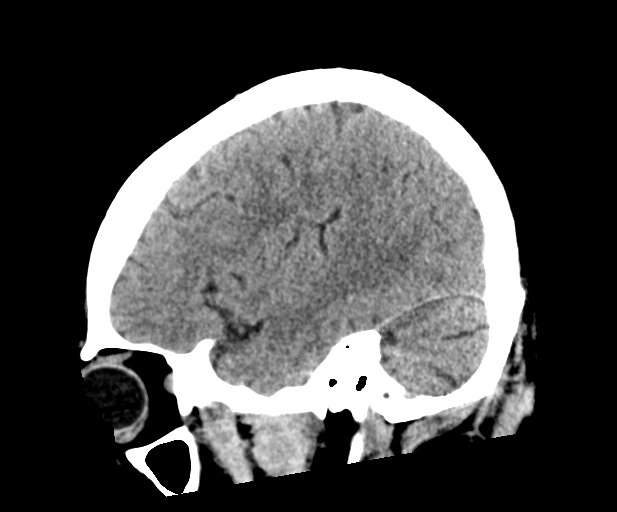
[im 33/66  brain]
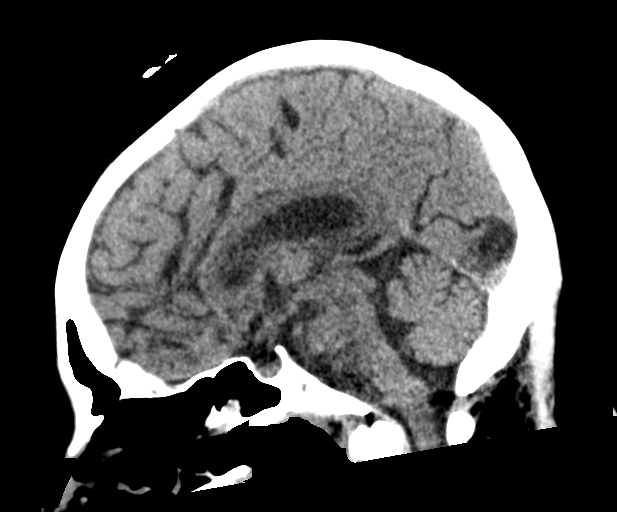
[im 44/66  brain]
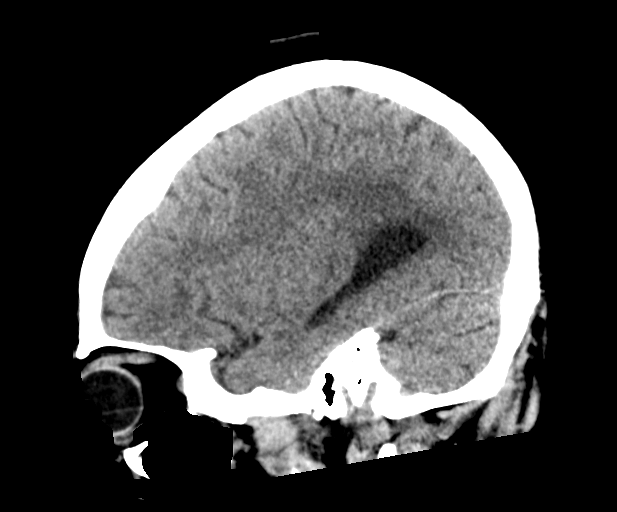

[15 of 47 positions shown; findings below may reference images not displayed]

FINDINGS: Brain: Patchy white matter hypodensity bilaterally unchanged.
Chronic infarct right occipital pole unchanged. Ventricle size
normal.

Negative for acute infarct, hemorrhage, mass

Vascular: Negative for hyperdense vessel

Skull: Negative

Sinuses/Orbits: Mucosal edema paranasal sinuses.  Negative orbit

Other: None

ASPECTS (Alberta Stroke Program Early CT Score)

- Ganglionic level infarction (caudate, lentiform nuclei, internal
capsule, insula, M1-M3 cortex): 7

- Supraganglionic infarction (M4-M6 cortex): 3

Total score (0-10 with 10 being normal): 10
IMPRESSION: 1. No acute abnormality. Chronic ischemic changes stable from the
prior study.
2. ASPECTS is 10
3. Code stroke imaging results were communicated on [DATE] at
[DATE] to provider GEDEI via text page

## 2020-09-06 MED ORDER — ACETAMINOPHEN 325 MG PO TABS
650.0000 mg | ORAL_TABLET | ORAL | Status: DC | PRN
  Administered 2020-09-07 – 2020-09-12 (×4): 650 mg via ORAL
  Filled 2020-09-06 (×6): qty 2

## 2020-09-06 MED ORDER — ASPIRIN 81 MG PO CHEW
81.0000 mg | CHEWABLE_TABLET | Freq: Every day | ORAL | Status: DC
  Administered 2020-09-06 – 2020-09-14 (×9): 81 mg via ORAL
  Filled 2020-09-06 (×9): qty 1

## 2020-09-06 MED ORDER — ATORVASTATIN CALCIUM 80 MG PO TABS
80.0000 mg | ORAL_TABLET | Freq: Every day | ORAL | Status: DC
  Administered 2020-09-06 – 2020-09-14 (×9): 80 mg via ORAL
  Filled 2020-09-06 (×9): qty 1

## 2020-09-06 MED ORDER — GABAPENTIN 400 MG PO CAPS
400.0000 mg | ORAL_CAPSULE | Freq: Two times a day (BID) | ORAL | Status: DC
  Administered 2020-09-06 – 2020-09-14 (×15): 400 mg via ORAL
  Filled 2020-09-06 (×16): qty 1

## 2020-09-06 MED ORDER — SENNOSIDES-DOCUSATE SODIUM 8.6-50 MG PO TABS: 1.0000 | ORAL_TABLET | Freq: Every evening | ORAL | Status: DC | PRN

## 2020-09-06 MED ORDER — STROKE: EARLY STAGES OF RECOVERY BOOK
Status: AC
  Filled 2020-09-06: qty 1

## 2020-09-06 MED ORDER — FERROUS SULFATE 325 (65 FE) MG PO TABS
325.0000 mg | ORAL_TABLET | Freq: Every day | ORAL | Status: DC
  Administered 2020-09-06 – 2020-09-13 (×8): 325 mg via ORAL
  Filled 2020-09-06 (×9): qty 1

## 2020-09-06 MED ORDER — LORAZEPAM 2 MG/ML IJ SOLN
1.0000 mg | Freq: Once | INTRAMUSCULAR | Status: AC | PRN
  Administered 2020-09-11: 1 mg via INTRAVENOUS
  Filled 2020-09-06: qty 1

## 2020-09-06 MED ORDER — ACETAMINOPHEN 650 MG RE SUPP: 650.0000 mg | RECTAL | Status: DC | PRN

## 2020-09-06 MED ORDER — SODIUM CHLORIDE 0.9 % IV SOLN: INTRAVENOUS | Status: DC

## 2020-09-06 MED ORDER — IOHEXOL 350 MG/ML SOLN
100.0000 mL | Freq: Once | INTRAVENOUS | Status: AC | PRN
  Administered 2020-09-06: 100 mL via INTRAVENOUS

## 2020-09-06 MED ORDER — BUPRENORPHINE HCL-NALOXONE HCL 8-2 MG SL SUBL
1.0000 | SUBLINGUAL_TABLET | Freq: Three times a day (TID) | SUBLINGUAL | Status: DC
Start: 2020-09-06 — End: 2020-09-14
  Administered 2020-09-06 – 2020-09-13 (×20): 1 via SUBLINGUAL
  Filled 2020-09-06 (×23): qty 1

## 2020-09-06 MED ORDER — ENOXAPARIN SODIUM 40 MG/0.4ML ~~LOC~~ SOLN
40.0000 mg | SUBCUTANEOUS | Status: DC
  Administered 2020-09-06 – 2020-09-12 (×7): 40 mg via SUBCUTANEOUS
  Filled 2020-09-06 (×8): qty 0.4

## 2020-09-06 MED ORDER — QUETIAPINE FUMARATE 200 MG PO TABS
200.0000 mg | ORAL_TABLET | Freq: Every day | ORAL | Status: DC
  Administered 2020-09-06 – 2020-09-12 (×7): 200 mg via ORAL
  Filled 2020-09-06 (×9): qty 1

## 2020-09-06 MED ORDER — ACETAMINOPHEN 160 MG/5ML PO SOLN: 650.0000 mg | ORAL | Status: DC | PRN

## 2020-09-06 MED ORDER — STROKE: EARLY STAGES OF RECOVERY BOOK: Freq: Once | Status: AC

## 2020-09-06 MED ORDER — INSULIN ASPART 100 UNIT/ML ~~LOC~~ SOLN
0.0000 [IU] | Freq: Three times a day (TID) | SUBCUTANEOUS | Status: DC
  Administered 2020-09-06: 2 [IU] via SUBCUTANEOUS
  Administered 2020-09-07 – 2020-09-08 (×3): 3 [IU] via SUBCUTANEOUS
  Administered 2020-09-08 (×2): 2 [IU] via SUBCUTANEOUS
  Administered 2020-09-09 (×2): 3 [IU] via SUBCUTANEOUS
  Administered 2020-09-09: 2 [IU] via SUBCUTANEOUS
  Administered 2020-09-10: 5 [IU] via SUBCUTANEOUS

## 2020-09-06 MED ORDER — HYDRALAZINE HCL 25 MG PO TABS: 25.0000 mg | ORAL_TABLET | Freq: Four times a day (QID) | ORAL | Status: DC | PRN

## 2020-09-06 NOTE — ED Notes (Signed)
Pt found standing in room twisted in her sheets demanding her bed linens to be remade with a fresh sheet and warmer blankets. Pt attempted to self remove IV and removed all monitoring equipment. Educated patient to stay in bed and use the call bell and not to remove monitoring equipment. Pt in NAD. Stated understanding. Bed in low position with side rails up. Call bell remains within reach. Warm blankets provided to patient.

## 2020-09-06 NOTE — Progress Notes (Signed)
Pt asked to put down phone for neuro assessment and upon placing her phone down. She became mute and did not respond to neuro assessment. She did not answer any questions. There is purposeful movement in BLE and strength is appropriate; however, when drift assessment was performed, there was no resistance to gravity. Patient highly encouraged to participate in neuro checks.

## 2020-09-06 NOTE — ED Notes (Signed)
Attempted to give report at this time 

## 2020-09-06 NOTE — Code Documentation (Signed)
Pt is a 45 yr old female with history of prior stroke with left sided deficits, diabetes, substance abuse and bipolar disorder. She was last known well last night at 2300. Today she has a worsening of her left-sided deficits, as well as dizziness. Pt arrived at 1232. She was cleared by EDP at bridge and proceeded to CT at 1235. Pt exam showed left sided weakness and sensory loss and dysarthria. In the CT scanner she was unable to look to the right. See flowsheet for full NIHSS and timeline. CT head negative for acute hemorrhage per Dr Otelia Limes. CTA and CTP also obtained. Pt returned to room 29 at 1258. She was noted to have increased left arm strength when undressing, and was also able to look to her right at that time. Per Dr Otelia Limes, CTA did not show an LVO. As her workup continues, she will need q 2 hr VS and mNIHSS. Plan discussed with Vicente Masson. Pt not given TPA as LKW was OOW. Not offered NIR as LVO negative.

## 2020-09-06 NOTE — Consult Note (Signed)
Referring Physician: Dr. Johnney Killian    Chief Complaint: Acute onset of worsened left sided weakness and facial droop relative to her chronic post-stroke baseline  HPI: Kaitlyn Gray is an 45 y.o. female with a PMHx of bipolar disorder, HONK, DM, HTN and a prior stroke in Fall of 2021 who presents with acute onset of  worsened left sided weakness and facial droop relative to her chronic post-stroke baseline. She has chronic left sided weakness and facial droop from her prior stroke. All of the current deficits are pre-existing - the patient states that they are just acutely worse. She had gone to bed last night at 2300 right after her symptoms started. When she woke up they were still there and had worsened even more, which is why she called EMS.   Vitals per EMS: 180/110, HR 74, CBG 133. Sats were normal on RA.   LSN: [id number] tPA Given: No: Out of the time window  Past Medical History:  Diagnosis Date  . Bipolar 1 disorder ([place])   . 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 ([place]) 07/19/2017  . Hypertension   . Stroke [facility])   DM on home insulin  Past Surgical History:  Procedure Laterality Date  . CESAREAN SECTION      Family History  Problem Relation Age of Onset  . Hypertension Mother   . CAD Mother 58       died of MI at age 35  . Hypertension Father    Social History:  reports that she has been smoking cigarettes. She has been smoking about 0.50 packs per day. She has never used smokeless tobacco. She reports current drug use. Drugs: Marijuana and Cocaine. She reports that she does not drink alcohol.  Allergies:  Allergies  Allergen Reactions  . Hydrocodone Itching  . Latex Rash    Home Medications:  No current facility-administered medications on file prior to encounter.   Current Outpatient Medications on File Prior to Encounter  Medication Sig Dispense Refill  . aspirin 81 MG chewable tablet Chew 1 tablet (81 mg total) by mouth daily. 30  tablet 1  . atorvastatin (LIPITOR) 80 MG tablet Take 1 tablet (80 mg total) by mouth daily. 30 tablet 1  . blood glucose meter kit and supplies KIT Dispense based on patient and insurance preference. Use up to four times daily as directed. (FOR ICD-9 250.00, 250.01). Please give 3 months supply of lancets and test strips to allow her to check QID. 1 each 0  . blood glucose meter kit and supplies Relion Prime or Dispense other brand based on patient and insurance preference. Use up to four times daily as directed. (FOR ICD-9 250.00, 250.01). 1 each 3  . ferrous sulfate 325 (65 FE) MG tablet Take 1 tablet (325 mg total) by mouth daily. (Patient not taking: Reported on 02/09/2020) 30 tablet 0  . hydrOXYzine (ATARAX/VISTARIL) 25 MG tablet Take 1 tablet (25 mg total) by mouth every 6 (six) hours as needed. (Patient not taking: Reported on 01/01/2020) 30 tablet 0  . Insulin Glargine (LANTUS SOLOSTAR) 100 UNIT/ML Solostar Pen Inject 45 Units into the skin every morning. (Patient not taking: Reported on 12/16/2019) 15 mL 1  . insulin lispro (HUMALOG KWIKPEN) 100 UNIT/ML KiwkPen Inject 0-0.2 mLs (0-20 Units total) into the skin 4 (four) times daily -  before meals and at bedtime. Inject 0-20 Units into the skin 3 (three) times daily with meals. CBG 70 - 120: 0 units  CBG 121 -  150: 3 units  CBG 151 - 200: 4 units  CBG 201 - 250: 7 units  CBG 251 - 300: 11 units  CBG 301 - 350: 15 units  CBG 351 - 400: 20 units (Patient not taking: Reported on 12/16/2019) 15 mL 11  . Insulin Pen Needle 31G X 5 MM MISC Use as directed 100 each 2  . lisinopril (ZESTRIL) 10 MG tablet Take 1 tablet (10 mg total) by mouth daily. 30 tablet 0  . metFORMIN (GLUCOPHAGE) 1000 MG tablet Take 1 tablet (1,000 mg total) by mouth 2 (two) times daily with a meal. 60 tablet 0    ROS:  Knee tenderness bilaterally. Other ROS as per HPI. The patient does not endorse any additional complaints.   Physical Examination: There were no vitals taken  for this visit.  HEENT: [place]/AT Lungs: Respirations unlabored Ext: No edema  Neurologic Examination: Mental Status: Awake and alert. Speech is dysarthric but with overall seems to be  embellished/nonphysiologic. There is a "baby talk" quality to the intonations/vowels/consonants of her speech as well. States "I don't know" when asked the day of the week but does know the month and year. Also correctly identifies the city and state.  Cranial Nerves: II:  Visual fields with apparent severe constriction in all 4 quadrants bilaterally with spared central vision, but will note stimuli in her periphery when distracted. PERRL.  III,IV, VI: No ptosis. EOMI.  V,VII: Abnormal-appearing spontaneous movements of her lips and jaw intermittently that do not appear to be lesional and are more consistent with embellishment.  VIII: hearing intact to voice IX,X: Palate rises symmetrically XI: Head is midline.  XII: midline tongue extension Motor: Poor effort with inconsistent strength and giveway in all 4 extremities, more pronounced on the left.  Initially will not move LLE. When examiner lets her know that he will be testing a "spinal cord reflex" by tickling her left foot, she moves it away purposefully, rapidly and with normal strength after an initial delay.  RLE with normal maximum strength output in the context of giveway and poor effort on initial trials.  Sensory: Subjectively decreased temp sensation on the left.  Deep Tendon Reflexes: Normoactive Plantars: No Babinski noted bilaterally  Cerebellar: No physiological appearing ataxia with FNF bilaterally. However, there are chaotic/erratic mixed large and small amplitude wavering movements bilaterally that appear nonphysiological and are not present when distracted.   Gait: Deferred  Results for orders placed or performed during the hospital encounter of 09/06/20 (from the past 48 hour(s))  CBG monitoring, ED     Status: Abnormal   Collection Time:  09/06/20 12:36 PM  Result Value Ref Range   Glucose-Capillary 129 (H) 70 - 99 mg/dL    Comment: Glucose reference range applies only to samples taken after fasting for at least 8 hours.   No results found.  Assessment: 45 y.o. female presenting with acute onset of worsened left sided weakness and facial droop relative to her chronic post-stroke baseline. 1. Exam reveals multiple functional attributes suggestive of a psychogenic etiology, but will need to fully rule out stroke with MRI.  2. CT head: No acute abnormality. Chronic ischemic changes stable from the prior study. ASPECTS is 10 3. CTA of head and neck: No large vessel occlusion. Circumferential plaque along the proximal right cervical ICA causes nearly 50% stenosis. Multifocal mild intracranial atherosclerosis. Moderate to marked focal stenosis of the right P2 PCA. 4. CTP: Negative 5. Stroke Risk Factors - Prior stroke, HTN and DM  Recommendations: 1. HgbA1c, fasting lipid panel 2. MRI of the brain without contrast. If positive for acute stroke, obtain TTE 3. PT consult, OT consult, Speech consult 4. Continue ASA and atorvastatin 5. Telemetry monitoring 6. Frequent neuro checks 7. Permissive HTN x 24 hours 8. Psychology consult to assess for possible life stressors  '@Electronically'  signed: Dr. Kerney Elbe  09/06/2020, 12:39 PM

## 2020-09-06 NOTE — ED Provider Notes (Signed)
[facility] EMERGENCY DEPARTMENT Provider Note   CSN: [id number] Arrival date & time: 09/06/20  1234  An emergency department physician performed an initial assessment on this suspected stroke patient at 1234.  History Chief Complaint  Patient presents with  . Code Stroke    Kaitlyn Gray is a 45 y.o. female.  HPI Patient reports that she already has left-sided weakness from her prior stroke.  However yesterday evening she reports that it seemed like her numbness and weakness were worse.  She reports she went to bed and when she woke up this morning it was not any better so she called EMS.  She reports she feels like her arm also hurts as well and is somewhat harder to move them previously.  No headache, no new visual changes.  She also notes that the left leg seems a little bit heavier to move as well.  She has not been ill recently no fever no chills.  No nausea no vomiting.  No cough.  Patient reports that she takes her medications as prescribed    Past Medical History:  Diagnosis Date  . Bipolar 1 disorder ([place])   . 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 ([place]) 07/19/2017  . Hypertension   . Stroke [facility])     Patient Active Problem List   Diagnosis Date Noted  . 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  . Normocytic anemia 02/09/2020  . Ischemic stroke ([place]) 01/01/2020  . Acute encephalopathy 05/12/2018  . Diabetes mellitus without complication ([place]) 17/51/0258  . Hypokalemia 12/06/2017  . Hypertension 12/06/2017  . 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 ([place]) 07/19/2017  . Bipolar disorder ([place]) 07/19/2017  . Polysubstance abuse ([place]) 07/19/2017  . Chest pain 07/19/2017  . Cocaine use disorder, severe, dependence ([place]) 03/24/2015  . Cannabis use disorder, moderate, dependence ([place]) 03/24/2015  . MDD (major depressive disorder), recurrent severe, without psychosis ([place])  03/24/2015    Past Surgical History:  Procedure Laterality Date  . CESAREAN SECTION       OB History   No obstetric history on file.     Family History  Problem Relation Age of Onset  . Hypertension Mother   . CAD Mother 40       died of MI at age 69  . Hypertension Father     Social History   Tobacco Use  . Smoking status: Current Every Day Smoker    Packs/day: 0.50    Types: Cigarettes  . Smokeless tobacco: Never Used  Vaping Use  . Vaping Use: Never used  Substance Use Topics  . Alcohol use: No  . Drug use: Yes    Types: Marijuana, Cocaine    Home Medications Prior to Admission medications   Medication Sig Start Date End Date Taking? Authorizing Provider  aspirin 81 MG chewable tablet Chew 1 tablet (81 mg total) by mouth daily. 03/15/20  Yes Carlisle Cater, PA-C  gabapentin (NEURONTIN) 400 MG capsule Take 400 mg by mouth 2 (two) times daily. 08/31/20  Yes [provider]  QUEtiapine (SEROQUEL) 200 MG tablet Take 200 mg by mouth at bedtime. 08/31/20  Yes [provider]  SUBOXONE 8-2 MG FILM Place 1 Film under the tongue in the morning, at noon, and at bedtime. 09/03/20  Yes [provider]  atorvastatin (LIPITOR) 80 MG tablet Take 1 tablet (80 mg total) by mouth daily. Patient not taking: Reported on 09/06/2020 03/15/20   Carlisle Cater,  PA-C  blood glucose meter kit and supplies KIT Dispense based on patient and insurance preference. Use up to four times daily as directed. (FOR ICD-9 250.00, 250.01). Please give 3 months supply of lancets and test strips to allow her to check QID. 12/07/17   Roxan Hockey, MD  blood glucose meter kit and supplies Relion Prime or Dispense other brand based on patient and insurance preference. Use up to four times daily as directed. (FOR ICD-9 250.00, 250.01). 12/07/17   Roxan Hockey, MD  ferrous sulfate 325 (65 FE) MG tablet Take 1 tablet (325 mg total) by mouth daily. 01/20/20   Charlesetta Shanks, MD   hydrOXYzine (ATARAX/VISTARIL) 25 MG tablet Take 1 tablet (25 mg total) by mouth every 6 (six) hours as needed. Patient not taking: No sig reported 12/16/19   Sherwood Gambler, MD  Insulin Glargine (LANTUS SOLOSTAR) 100 UNIT/ML Solostar Pen Inject 45 Units into the skin every morning. Patient not taking: No sig reported 12/07/17   Roxan Hockey, MD  insulin lispro (HUMALOG KWIKPEN) 100 UNIT/ML KiwkPen Inject 0-0.2 mLs (0-20 Units total) into the skin 4 (four) times daily -  before meals and at bedtime. Inject 0-20 Units into the skin 3 (three) times daily with meals. CBG 70 - 120: 0 units  CBG 121 - 150: 3 units  CBG 151 - 200: 4 units  CBG 201 - 250: 7 units  CBG 251 - 300: 11 units  CBG 301 - 350: 15 units  CBG 351 - 400: 20 units Patient not taking: No sig reported 12/07/17   Roxan Hockey, MD  Insulin Pen Needle 31G X 5 MM MISC Use as directed 12/07/17   Roxan Hockey, MD  lisinopril (ZESTRIL) 10 MG tablet Take 1 tablet (10 mg total) by mouth daily. Patient not taking: Reported on 09/06/2020 01/20/20 03/15/29  Charlesetta Shanks, MD  metFORMIN (GLUCOPHAGE) 1000 MG tablet Take 1 tablet (1,000 mg total) by mouth 2 (two) times daily with a meal. Patient not taking: Reported on 09/06/2020 01/20/20   Charlesetta Shanks, MD    Allergies    Hydrocodone and Latex  Review of Systems   Review of Systems 10 systems reviewed and negative except as per HPI Physical Exam Updated Vital Signs BP (!) 161/102   Pulse 62   Temp 97.7 F (36.5 C) (Oral)   Resp 11   Ht '5\' 3"'  (1.6 m)   Wt 96.5 kg   SpO2 98%   BMI 37.69 kg/m   Physical Exam Constitutional:      Comments: Patient is alert.  No distress.  Respirations nonlabored.  HENT:     Head: Normocephalic and atraumatic.     Mouth/Throat:     Mouth: Mucous membranes are moist.     Pharynx: Oropharynx is clear.  Eyes:     Extraocular Movements: Extraocular movements intact.     Pupils: Pupils are equal, round, and reactive to light.   Cardiovascular:     Rate and Rhythm: Normal rate and regular rhythm.  Pulmonary:     Effort: Pulmonary effort is normal.     Breath sounds: Normal breath sounds.  Abdominal:     General: There is no distension.     Palpations: Abdomen is soft.     Tenderness: There is no abdominal tenderness.  Musculoskeletal:     Comments: No acute deformities.  Some 1+ swelling bilateral lower extremities.  Skin:    General: Skin is warm and dry.  Neurological:     Comments: Patient is  alert and cheerful.  Speech is clear.  Speech pattern indicates some cognitive delay.  He follows commands appropriately.  Grip strength slightly less on left than right.  3 out of 5 left 5\5 right.  Patient can lift the right lower extremity from the bed without difficulty.  Left lower extremity slightly delayed 3\5 for elevation.  Patient can dorsiflex and extend bilateral feet however weakness on the left 3\5.  Psychiatric:        Mood and Affect: Mood normal.     ED Results / Procedures / Treatments   Labs (all labs ordered are listed, but only abnormal results are displayed) Labs Reviewed  CBC - Abnormal; Notable for the following components:      Result Value   Hemoglobin 9.5 (*)    HCT 32.8 (*)    MCV 78.5 (*)    MCH 22.7 (*)    MCHC 29.0 (*)    RDW 18.0 (*)    All other components within normal limits  COMPREHENSIVE METABOLIC PANEL - Abnormal; Notable for the following components:   Potassium 3.3 (*)    Glucose, Bld 137 (*)    Calcium 8.6 (*)    Albumin 3.4 (*)    All other components within normal limits  I-STAT CHEM 8, ED - Abnormal; Notable for the following components:   Glucose, Bld 139 (*)    Calcium, Ion 1.13 (*)    Hemoglobin 10.5 (*)    HCT 31.0 (*)    All other components within normal limits  CBG MONITORING, ED - Abnormal; Notable for the following components:   Glucose-Capillary 129 (*)    All other components within normal limits  RESP PANEL BY RT-PCR (FLU A&B, COVID) ARPGX2   ETHANOL  PROTIME-INR  APTT  DIFFERENTIAL  RAPID URINE DRUG SCREEN, HOSP PERFORMED  URINALYSIS, ROUTINE W REFLEX MICROSCOPIC  I-STAT BETA HCG BLOOD, ED (MC, WL, AP ONLY)  TROPONIN I (HIGH SENSITIVITY)    EKG EKG Interpretation  Date/Time:  Thursday September 06 2020 13:00:55 EDT Ventricular Rate:  70 PR Interval:    QRS Duration: 115 QT Interval:  430 QTC Calculation: 464 R Axis:   33 Text Interpretation: Sinus rhythm Nonspecific intraventricular conduction delay Borderline ST elevation, anterior leads since last tracing no significant change Confirmed by Noemi Chapel [phone]) on 09/06/2020 3:40:25 PM   Radiology CT ANGIO HEAD W OR WO CONTRAST  Result Date: 09/06/2020 CLINICAL DATA:  Left-sided weakness EXAM: CT ANGIOGRAPHY HEAD AND NECK CT PERFUSION BRAIN TECHNIQUE: Multidetector CT imaging of the head and neck was performed using the standard protocol during bolus administration of intravenous contrast. Multiplanar CT image reconstructions and MIPs were obtained to evaluate the vascular anatomy. Carotid stenosis measurements (when applicable) are obtained utilizing NASCET criteria, using the distal internal carotid diameter as the denominator. Multiphase CT imaging of the brain was performed following IV bolus contrast injection. Subsequent parametric perfusion maps were calculated using RAPID software. CONTRAST:  11m OMNIPAQUE IOHEXOL 350 MG/ML SOLN COMPARISON:  None. FINDINGS: CTA NECK Aortic arch: Great vessel origins are patent. Right carotid system: Patent. Circumferential plaque along the cervical ICA approximately 1.5 cm from the origin causes nearly 50% stenosis. Partially retropharyngeal course. Left carotid system: Patent. No stenosis at the ICA origin. Partially retropharyngeal course. Vertebral arteries: Patent. Right vertebral artery slightly dominant. No stenosis. Skeleton: Mild degenerative changes of the cervical spine. Other neck: Negative. Upper chest: No apical lung mass.  Review of the MIP images confirms the above findings CTA HEAD Anterior  circulation: Intracranial internal carotid arteries are patent with mild stenosis on the left. Anterior cerebral arteries are patent. Middle cerebral arteries are patent. Mild atherosclerotic irregularity of distal ACA and MCA branches. Posterior circulation: Intracranial vertebral arteries, basilar artery, and posterior cerebral arteries are patent. Major cerebellar artery origins are patent. Atherosclerotic irregularity of the basilar and posterior cerebral arteries. Moderate to marked focal stenosis of the right P2 PCA. Mild stenoses on the left. Venous sinuses: Patent as allowed by contrast bolus timing. Review of the MIP images confirms the above findings CT Brain Perfusion Findings: CBF (<30%) Volume: 57m Perfusion (Tmax>6.0s) volume: 043mMismatch Volume: 18m66mnfarction Location: None. IMPRESSION: No large vessel occlusion. Circumferential plaque along the proximal right cervical ICA causes nearly 50% stenosis. Multifocal mild intracranial atherosclerosis. Moderate to marked focal stenosis of the right P2 PCA. Initial results were communicated to Dr. LinCheral Marker 1:04 pm on 09/06/2020 by text page via the [facility] system. Electronically Signed   By: PraMacy MisD.   On: 09/06/2020 13:10   CT ANGIO NECK W OR WO CONTRAST  Result Date: 09/06/2020 CLINICAL DATA:  Left-sided weakness EXAM: CT ANGIOGRAPHY HEAD AND NECK CT PERFUSION BRAIN TECHNIQUE: Multidetector CT imaging of the head and neck was performed using the standard protocol during bolus administration of intravenous contrast. Multiplanar CT image reconstructions and MIPs were obtained to evaluate the vascular anatomy. Carotid stenosis measurements (when applicable) are obtained utilizing NASCET criteria, using the distal internal carotid diameter as the denominator. Multiphase CT imaging of the brain was performed following IV bolus contrast injection. Subsequent  parametric perfusion maps were calculated using RAPID software. CONTRAST:  1018m76mNIPAQUE IOHEXOL 350 MG/ML SOLN COMPARISON:  None. FINDINGS: CTA NECK Aortic arch: Great vessel origins are patent. Right carotid system: Patent. Circumferential plaque along the cervical ICA approximately 1.5 cm from the origin causes nearly 50% stenosis. Partially retropharyngeal course. Left carotid system: Patent. No stenosis at the ICA origin. Partially retropharyngeal course. Vertebral arteries: Patent. Right vertebral artery slightly dominant. No stenosis. Skeleton: Mild degenerative changes of the cervical spine. Other neck: Negative. Upper chest: No apical lung mass. Review of the MIP images confirms the above findings CTA HEAD Anterior circulation: Intracranial internal carotid arteries are patent with mild stenosis on the left. Anterior cerebral arteries are patent. Middle cerebral arteries are patent. Mild atherosclerotic irregularity of distal ACA and MCA branches. Posterior circulation: Intracranial vertebral arteries, basilar artery, and posterior cerebral arteries are patent. Major cerebellar artery origins are patent. Atherosclerotic irregularity of the basilar and posterior cerebral arteries. Moderate to marked focal stenosis of the right P2 PCA. Mild stenoses on the left. Venous sinuses: Patent as allowed by contrast bolus timing. Review of the MIP images confirms the above findings CT Brain Perfusion Findings: CBF (<30%) Volume: 18mL 33mfusion (Tmax>6.0s) volume: 18mL M76match Volume: 18mL In58mction Location: None. IMPRESSION: No large vessel occlusion. Circumferential plaque along the proximal right cervical ICA causes nearly 50% stenosis. Multifocal mild intracranial atherosclerosis. Moderate to marked focal stenosis of the right P2 PCA. Initial results were communicated to Dr. LindzenCheral Marker4 pm on 09/06/2020 by text page via the [facility] system. Electronically Signed   By: PraneilMacy Mis On:  09/06/2020 13:10   CT CEREBRAL PERFUSION W CONTRAST  Result Date: 09/06/2020 CLINICAL DATA:  Left-sided weakness EXAM: CT ANGIOGRAPHY HEAD AND NECK CT PERFUSION BRAIN TECHNIQUE: Multidetector CT imaging of the head and neck was performed using the standard protocol during bolus administration of intravenous contrast. Multiplanar CT  image reconstructions and MIPs were obtained to evaluate the vascular anatomy. Carotid stenosis measurements (when applicable) are obtained utilizing NASCET criteria, using the distal internal carotid diameter as the denominator. Multiphase CT imaging of the brain was performed following IV bolus contrast injection. Subsequent parametric perfusion maps were calculated using RAPID software. CONTRAST:  159m OMNIPAQUE IOHEXOL 350 MG/ML SOLN COMPARISON:  None. FINDINGS: CTA NECK Aortic arch: Great vessel origins are patent. Right carotid system: Patent. Circumferential plaque along the cervical ICA approximately 1.5 cm from the origin causes nearly 50% stenosis. Partially retropharyngeal course. Left carotid system: Patent. No stenosis at the ICA origin. Partially retropharyngeal course. Vertebral arteries: Patent. Right vertebral artery slightly dominant. No stenosis. Skeleton: Mild degenerative changes of the cervical spine. Other neck: Negative. Upper chest: No apical lung mass. Review of the MIP images confirms the above findings CTA HEAD Anterior circulation: Intracranial internal carotid arteries are patent with mild stenosis on the left. Anterior cerebral arteries are patent. Middle cerebral arteries are patent. Mild atherosclerotic irregularity of distal ACA and MCA branches. Posterior circulation: Intracranial vertebral arteries, basilar artery, and posterior cerebral arteries are patent. Major cerebellar artery origins are patent. Atherosclerotic irregularity of the basilar and posterior cerebral arteries. Moderate to marked focal stenosis of the right P2 PCA. Mild stenoses on  the left. Venous sinuses: Patent as allowed by contrast bolus timing. Review of the MIP images confirms the above findings CT Brain Perfusion Findings: CBF (<30%) Volume: 071mPerfusion (Tmax>6.0s) volume: 17m25mismatch Volume: 17mL29mfarction Location: None. IMPRESSION: No large vessel occlusion. Circumferential plaque along the proximal right cervical ICA causes nearly 50% stenosis. Multifocal mild intracranial atherosclerosis. Moderate to marked focal stenosis of the right P2 PCA. Initial results were communicated to Dr. LindCheral Marker1:04 pm on 09/06/2020 by text page via the [facility] system. Electronically Signed   By: PranMacy Mis.   On: 09/06/2020 13:10   CT HEAD CODE STROKE WO CONTRAST  Result Date: 09/06/2020 CLINICAL DATA:  Code stroke. Acute neuro deficit. Slurred speech and left arm weakness and numbness EXAM: CT HEAD WITHOUT CONTRAST TECHNIQUE: Contiguous axial images were obtained from the base of the skull through the vertex without intravenous contrast. COMPARISON:  CT head 07/10/2020 FINDINGS: Brain: Patchy white matter hypodensity bilaterally unchanged. Chronic infarct right occipital pole unchanged. Ventricle size normal. Negative for acute infarct, hemorrhage, mass Vascular: Negative for hyperdense vessel Skull: Negative Sinuses/Orbits: Mucosal edema paranasal sinuses.  Negative orbit Other: None ASPECTS ([place] [facility] Early CT Score) - Ganglionic level infarction (caudate, lentiform nuclei, internal capsule, insula, M1-M3 cortex): 7 - Supraganglionic infarction (M4-M6 cortex): 3 Total score (0-10 with 10 being normal): 10 IMPRESSION: 1. No acute abnormality. Chronic ischemic changes stable from the prior study. 2. ASPECTS is 10 3. Code stroke imaging results were communicated on 09/06/2020 at 12:47 pm to provider Lindzen via text page Electronically Signed   By: CharFranchot Gallo.   On: 09/06/2020 12:47    Procedures Procedures   Medications Ordered in ED Medications   LORazepam (ATIVAN) injection 1 mg (has no administration in time range)  iohexol (OMNIPAQUE) 350 MG/ML injection 100 mL (100 mLs Intravenous Contrast Given 09/06/20 1255)    ED Course  I have reviewed the triage vital signs and the nursing notes.  Pertinent labs & imaging results that were available during my care of the patient were reviewed by me and considered in my medical decision making (see chart for details).    MDM Rules/Calculators/A&P  Consult: Patient evaluated as code stroke.  Dr. Cheral Marker has reviewed initial angiograms no large vessel occlusion.  Recommends admission to medical service with MRI and stroke work-up.  Also recommends consultation to psychology.   Patient presents with history of prior stroke.  She identified worsening symptoms of left-sided weakness as opposed to chronic baseline.  Patient was brought in as a code stroke.  At this time she is alert and in no acute distress.  No headache.  She does have some weakness left-sided, no LVO identified.  Patient speaks pattern somewhat suggestive of cognitive delay.  This is reviewed with Dr. Cheral Marker.  At this time unclear if this is possibly stroke related versus associated with underlying psychiatric diagnoses.  In addition to stroke work-up and MRI, he recommends consultation to psychology while hospitalized if possible.  Patient is alert and nontoxic.  No respiratory distress.  Stable for ongoing diagnostic evaluation and work-up. Final Clinical Impression(s) / ED Diagnoses Final diagnoses:  Left-sided weakness  H/O: CVA (cerebrovascular accident)    Rx / DC Orders ED Discharge Orders    None       Charlesetta Shanks, MD 09/06/20 1553

## 2020-09-06 NOTE — ED Triage Notes (Signed)
Pt arrived via [facility] with c/c of possible stroke. Per EMS pt woke up this morning and was having worsening symptoms. Pt stated symptoms originally started last night. Pt has hx of stroke with L side numbness. Per pt weakness has worsened causing her to call EMS. Pt states "it feels diffeernt".   180/110, 74HR, 98%, 133BS

## 2020-09-06 NOTE — H&P (Addendum)
History and Physical    Kaitlyn Gray:[id number] DOB: 1975-12-09 DOA: 09/06/2020  PCP: Patient, No Pcp Per (Confirm with patient/family/NH records and if not entered, this has to be entered at [facility] point of entry) Patient coming from: Home  I have personally briefly reviewed patient's old medical records in [place]  Chief Complaint: Left sided weakness  HPI: Kaitlyn Gray is a 45 y.o. female with medical history significant of Stroke x2 in 2021 with residual bilateral arm weakness (right-sided weakness after for stroke and then left-sided weakness after the second stroke) noncompliant with aspirin and statin, HTN, IDDM noncompliant with insulin, bipolar disorder, chronic iron deficiency anemia, presented with worsening of the residue left-sided weakness.  Symptoms happened yesterday evening, suddenly developed left-sided weakness involving the left arm and left leg along with numbness, denied any weakness numbness of any other limbs, and she feels her speech is the same as baseline, denies any headache blurry vision or hearing changes.  Patient reported she has not been able to refill her insulin and starting after she ran them out sometime after her second stroke last year.  She lives by herself she said there is no family member visiting her recently, denied feeling depressed.  She started Suboxone treatment about 2 months ago otherwise there is no new medications recently.  ED Course: Left sided weakness. CTA showed noncritical narrowing of right ICA.  Review of Systems: As per HPI otherwise 14 point review of systems negative.    Past Medical History:  Diagnosis Date  . Bipolar 1 disorder ([place])   . 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 ([place]) 07/19/2017  . Hypertension   . Stroke [facility])     Past Surgical History:  Procedure Laterality Date  . CESAREAN SECTION       reports that she has been smoking cigarettes. She has been smoking  about 0.50 packs per day. She has never used smokeless tobacco. She reports current drug use. Drugs: Marijuana and Cocaine. She reports that she does not drink alcohol.  Allergies  Allergen Reactions  . Hydrocodone Itching  . Latex Rash    Family History  Problem Relation Age of Onset  . Hypertension Mother   . CAD Mother 61       died of MI at age 29  . Hypertension Father      Prior to Admission medications   Medication Sig Start Date End Date Taking? Authorizing Provider  aspirin 81 MG chewable tablet Chew 1 tablet (81 mg total) by mouth daily. 03/15/20  Yes Carlisle Cater, PA-C  gabapentin (NEURONTIN) 400 MG capsule Take 400 mg by mouth 2 (two) times daily. 08/31/20  Yes [provider]  QUEtiapine (SEROQUEL) 200 MG tablet Take 200 mg by mouth at bedtime. 08/31/20  Yes [provider]  SUBOXONE 8-2 MG FILM Place 1 Film under the tongue in the morning, at noon, and at bedtime. 09/03/20  Yes [provider]  atorvastatin (LIPITOR) 80 MG tablet Take 1 tablet (80 mg total) by mouth daily. Patient not taking: Reported on 09/06/2020 03/15/20   Carlisle Cater, PA-C  blood glucose meter kit and supplies KIT Dispense based on patient and insurance preference. Use up to four times daily as directed. (FOR ICD-9 250.00, 250.01). Please give 3 months supply of lancets and test strips to allow her to check QID. 12/07/17   Roxan Hockey, MD  blood glucose meter kit and supplies Relion Prime or Dispense other brand based  on patient and insurance preference. Use up to four times daily as directed. (FOR ICD-9 250.00, 250.01). 12/07/17   Roxan Hockey, MD  ferrous sulfate 325 (65 FE) MG tablet Take 1 tablet (325 mg total) by mouth daily. 01/20/20   Charlesetta Shanks, MD  hydrOXYzine (ATARAX/VISTARIL) 25 MG tablet Take 1 tablet (25 mg total) by mouth every 6 (six) hours as needed. Patient not taking: No sig reported 12/16/19   Sherwood Gambler, MD  Insulin Glargine (LANTUS SOLOSTAR)  100 UNIT/ML Solostar Pen Inject 45 Units into the skin every morning. Patient not taking: No sig reported 12/07/17   Roxan Hockey, MD  insulin lispro (HUMALOG KWIKPEN) 100 UNIT/ML KiwkPen Inject 0-0.2 mLs (0-20 Units total) into the skin 4 (four) times daily -  before meals and at bedtime. Inject 0-20 Units into the skin 3 (three) times daily with meals. CBG 70 - 120: 0 units  CBG 121 - 150: 3 units  CBG 151 - 200: 4 units  CBG 201 - 250: 7 units  CBG 251 - 300: 11 units  CBG 301 - 350: 15 units  CBG 351 - 400: 20 units Patient not taking: No sig reported 12/07/17   Roxan Hockey, MD  Insulin Pen Needle 31G X 5 MM MISC Use as directed 12/07/17   Roxan Hockey, MD  lisinopril (ZESTRIL) 10 MG tablet Take 1 tablet (10 mg total) by mouth daily. Patient not taking: Reported on 09/06/2020 01/20/20 03/15/29  Charlesetta Shanks, MD  metFORMIN (GLUCOPHAGE) 1000 MG tablet Take 1 tablet (1,000 mg total) by mouth 2 (two) times daily with a meal. Patient not taking: Reported on 09/06/2020 01/20/20   Charlesetta Shanks, MD    Physical Exam: Vitals:   09/06/20 1400 09/06/20 1415 09/06/20 1430 09/06/20 1500  BP: (!) 176/98 125/81 (!) 134/118 (!) 161/102  Pulse: 67 67 60 62  Resp: '20 13 18 11  ' Temp:      TempSrc:      SpO2: 100% 100% 99% 98%  Weight:      Height:        Constitutional: NAD, calm, comfortable Vitals:   09/06/20 1400 09/06/20 1415 09/06/20 1430 09/06/20 1500  BP: (!) 176/98 125/81 (!) 134/118 (!) 161/102  Pulse: 67 67 60 62  Resp: '20 13 18 11  ' Temp:      TempSrc:      SpO2: 100% 100% 99% 98%  Weight:      Height:       Eyes: PERRL, lids and conjunctivae normal ENMT: Mucous membranes are moist. Posterior pharynx clear of any exudate or lesions.Normal dentition.  Neck: normal, supple, no masses, no thyromegaly Respiratory: clear to auscultation bilaterally, no wheezing, no crackles. Normal respiratory effort. No accessory muscle use.  Cardiovascular: Regular rate and rhythm, no  murmurs / rubs / gallops. No extremity edema. 2+ pedal pulses. No carotid bruits.  Abdomen: no tenderness, no masses palpated. No hepatosplenomegaly. Bowel sounds positive.  Musculoskeletal: no clubbing / cyanosis. No joint deformity upper and lower extremities. Good ROM, no contractures. Normal muscle tone.  Skin: no rashes, lesions, ulcers. No induration Neurologic: No facial droop or tongue deviation, left-sided arm wrist and hand strength 4/5 compared to 5/5 on the right side.   Psychiatric: Normal judgment and insight. Alert and oriented x 3. Normal mood.     Labs on Admission: I have personally reviewed following labs and imaging studies  CBC: Recent Labs  Lab 09/06/20 1238 09/06/20 1245  WBC 4.0  --   NEUTROABS 2.1  --  HGB 9.5* 10.5*  HCT 32.8* 31.0*  MCV 78.5*  --   PLT 321  --    Basic Metabolic Panel: Recent Labs  Lab 09/06/20 1238 09/06/20 1245  NA 140 142  K 3.3* 3.6  CL 107 105  CO2 25  --   GLUCOSE 137* 139*  BUN 10 11  CREATININE 0.93 0.90  CALCIUM 8.6*  --    GFR: Estimated Creatinine Clearance: 87.2 mL/min (by C-G formula based on SCr of 0.9 mg/dL). Liver Function Tests: Recent Labs  Lab 09/06/20 1238  AST 17  ALT 9  ALKPHOS 59  BILITOT 0.4  PROT 6.7  ALBUMIN 3.4*   No results for input(s): LIPASE, AMYLASE in the last 168 hours. No results for input(s): AMMONIA in the last 168 hours. Coagulation Profile: Recent Labs  Lab 09/06/20 1238  INR 1.1   Cardiac Enzymes: No results for input(s): CKTOTAL, CKMB, CKMBINDEX, TROPONINI in the last 168 hours. BNP (last 3 results) No results for input(s): PROBNP in the last 8760 hours. HbA1C: No results for input(s): HGBA1C in the last 72 hours. CBG: Recent Labs  Lab 09/06/20 1236  GLUCAP 129*   Lipid Profile: No results for input(s): CHOL, HDL, LDLCALC, TRIG, CHOLHDL, LDLDIRECT in the last 72 hours. Thyroid Function Tests: No results for input(s): TSH, T4TOTAL, FREET4, T3FREE, THYROIDAB in  the last 72 hours. Anemia Panel: No results for input(s): VITAMINB12, FOLATE, FERRITIN, TIBC, IRON, RETICCTPCT in the last 72 hours. Urine analysis:    Component Value Date/Time   COLORURINE YELLOW 02/01/2020 2240   APPEARANCEUR CLEAR 02/01/2020 2240   LABSPEC 1.016 02/01/2020 2240   PHURINE 6.0 02/01/2020 2240   GLUCOSEU NEGATIVE 02/01/2020 2240   HGBUR NEGATIVE 02/01/2020 2240   BILIRUBINUR NEGATIVE 02/01/2020 2240   KETONESUR NEGATIVE 02/01/2020 2240   PROTEINUR NEGATIVE 02/01/2020 2240   UROBILINOGEN 0.2 10/06/2014 0856   NITRITE NEGATIVE 02/01/2020 2240   LEUKOCYTESUR NEGATIVE 02/01/2020 2240    Radiological Exams on Admission: CT ANGIO HEAD W OR WO CONTRAST  Result Date: 09/06/2020 CLINICAL DATA:  Left-sided weakness EXAM: CT ANGIOGRAPHY HEAD AND NECK CT PERFUSION BRAIN TECHNIQUE: Multidetector CT imaging of the head and neck was performed using the standard protocol during bolus administration of intravenous contrast. Multiplanar CT image reconstructions and MIPs were obtained to evaluate the vascular anatomy. Carotid stenosis measurements (when applicable) are obtained utilizing NASCET criteria, using the distal internal carotid diameter as the denominator. Multiphase CT imaging of the brain was performed following IV bolus contrast injection. Subsequent parametric perfusion maps were calculated using RAPID software. CONTRAST:  152m OMNIPAQUE IOHEXOL 350 MG/ML SOLN COMPARISON:  None. FINDINGS: CTA NECK Aortic arch: Great vessel origins are patent. Right carotid system: Patent. Circumferential plaque along the cervical ICA approximately 1.5 cm from the origin causes nearly 50% stenosis. Partially retropharyngeal course. Left carotid system: Patent. No stenosis at the ICA origin. Partially retropharyngeal course. Vertebral arteries: Patent. Right vertebral artery slightly dominant. No stenosis. Skeleton: Mild degenerative changes of the cervical spine. Other neck: Negative. Upper chest:  No apical lung mass. Review of the MIP images confirms the above findings CTA HEAD Anterior circulation: Intracranial internal carotid arteries are patent with mild stenosis on the left. Anterior cerebral arteries are patent. Middle cerebral arteries are patent. Mild atherosclerotic irregularity of distal ACA and MCA branches. Posterior circulation: Intracranial vertebral arteries, basilar artery, and posterior cerebral arteries are patent. Major cerebellar artery origins are patent. Atherosclerotic irregularity of the basilar and posterior cerebral arteries. Moderate to marked focal  stenosis of the right P2 PCA. Mild stenoses on the left. Venous sinuses: Patent as allowed by contrast bolus timing. Review of the MIP images confirms the above findings CT Brain Perfusion Findings: CBF (<30%) Volume: 28m Perfusion (Tmax>6.0s) volume: 057mMismatch Volume: 9m52mnfarction Location: None. IMPRESSION: No large vessel occlusion. Circumferential plaque along the proximal right cervical ICA causes nearly 50% stenosis. Multifocal mild intracranial atherosclerosis. Moderate to marked focal stenosis of the right P2 PCA. Initial results were communicated to Dr. LinCheral Marker 1:04 pm on 09/06/2020 by text page via the [facility] system. Electronically Signed   By: PraMacy MisD.   On: 09/06/2020 13:10   CT ANGIO NECK W OR WO CONTRAST  Result Date: 09/06/2020 CLINICAL DATA:  Left-sided weakness EXAM: CT ANGIOGRAPHY HEAD AND NECK CT PERFUSION BRAIN TECHNIQUE: Multidetector CT imaging of the head and neck was performed using the standard protocol during bolus administration of intravenous contrast. Multiplanar CT image reconstructions and MIPs were obtained to evaluate the vascular anatomy. Carotid stenosis measurements (when applicable) are obtained utilizing NASCET criteria, using the distal internal carotid diameter as the denominator. Multiphase CT imaging of the brain was performed following IV bolus contrast injection.  Subsequent parametric perfusion maps were calculated using RAPID software. CONTRAST:  109m6mNIPAQUE IOHEXOL 350 MG/ML SOLN COMPARISON:  None. FINDINGS: CTA NECK Aortic arch: Great vessel origins are patent. Right carotid system: Patent. Circumferential plaque along the cervical ICA approximately 1.5 cm from the origin causes nearly 50% stenosis. Partially retropharyngeal course. Left carotid system: Patent. No stenosis at the ICA origin. Partially retropharyngeal course. Vertebral arteries: Patent. Right vertebral artery slightly dominant. No stenosis. Skeleton: Mild degenerative changes of the cervical spine. Other neck: Negative. Upper chest: No apical lung mass. Review of the MIP images confirms the above findings CTA HEAD Anterior circulation: Intracranial internal carotid arteries are patent with mild stenosis on the left. Anterior cerebral arteries are patent. Middle cerebral arteries are patent. Mild atherosclerotic irregularity of distal ACA and MCA branches. Posterior circulation: Intracranial vertebral arteries, basilar artery, and posterior cerebral arteries are patent. Major cerebellar artery origins are patent. Atherosclerotic irregularity of the basilar and posterior cerebral arteries. Moderate to marked focal stenosis of the right P2 PCA. Mild stenoses on the left. Venous sinuses: Patent as allowed by contrast bolus timing. Review of the MIP images confirms the above findings CT Brain Perfusion Findings: CBF (<30%) Volume: 9mL 17mfusion (Tmax>6.0s) volume: 9mL M43match Volume: 9mL In87mction Location: None. IMPRESSION: No large vessel occlusion. Circumferential plaque along the proximal right cervical ICA causes nearly 50% stenosis. Multifocal mild intracranial atherosclerosis. Moderate to marked focal stenosis of the right P2 PCA. Initial results were communicated to Dr. LindzenCheral Marker4 pm on 09/06/2020 by text page via the [facility] system. Electronically Signed   By: PraneilMacy Mis  On: 09/06/2020 13:10   CT CEREBRAL PERFUSION W CONTRAST  Result Date: 09/06/2020 CLINICAL DATA:  Left-sided weakness EXAM: CT ANGIOGRAPHY HEAD AND NECK CT PERFUSION BRAIN TECHNIQUE: Multidetector CT imaging of the head and neck was performed using the standard protocol during bolus administration of intravenous contrast. Multiplanar CT image reconstructions and MIPs were obtained to evaluate the vascular anatomy. Carotid stenosis measurements (when applicable) are obtained utilizing NASCET criteria, using the distal internal carotid diameter as the denominator. Multiphase CT imaging of the brain was performed following IV bolus contrast injection. Subsequent parametric perfusion maps were calculated using RAPID software. CONTRAST:  109mL OM57mQUE IOHEXOL 350 MG/ML SOLN COMPARISON:  None. FINDINGS:  CTA NECK Aortic arch: Great vessel origins are patent. Right carotid system: Patent. Circumferential plaque along the cervical ICA approximately 1.5 cm from the origin causes nearly 50% stenosis. Partially retropharyngeal course. Left carotid system: Patent. No stenosis at the ICA origin. Partially retropharyngeal course. Vertebral arteries: Patent. Right vertebral artery slightly dominant. No stenosis. Skeleton: Mild degenerative changes of the cervical spine. Other neck: Negative. Upper chest: No apical lung mass. Review of the MIP images confirms the above findings CTA HEAD Anterior circulation: Intracranial internal carotid arteries are patent with mild stenosis on the left. Anterior cerebral arteries are patent. Middle cerebral arteries are patent. Mild atherosclerotic irregularity of distal ACA and MCA branches. Posterior circulation: Intracranial vertebral arteries, basilar artery, and posterior cerebral arteries are patent. Major cerebellar artery origins are patent. Atherosclerotic irregularity of the basilar and posterior cerebral arteries. Moderate to marked focal stenosis of the right P2 PCA. Mild stenoses  on the left. Venous sinuses: Patent as allowed by contrast bolus timing. Review of the MIP images confirms the above findings CT Brain Perfusion Findings: CBF (<30%) Volume: 16m Perfusion (Tmax>6.0s) volume: 03mMismatch Volume: 36m37mnfarction Location: None. IMPRESSION: No large vessel occlusion. Circumferential plaque along the proximal right cervical ICA causes nearly 50% stenosis. Multifocal mild intracranial atherosclerosis. Moderate to marked focal stenosis of the right P2 PCA. Initial results were communicated to Dr. LinCheral Marker 1:04 pm on 09/06/2020 by text page via the [facility] Passaging system. Electronically Signed   By: PraMacy MisD.   On: 09/06/2020 13:10   CT HEAD CODE STROKE WO CONTRAST  Result Date: 09/06/2020 CLINICAL DATA:  Code stroke. Acute neuro deficit. Slurred speech and left arm weakness and numbness EXAM: CT HEAD WITHOUT CONTRAST TECHNIQUE: Contiguous axial images were obtained from the base of the skull through the vertex without intravenous contrast. COMPARISON:  CT head 07/10/2020 FINDINGS: Brain: Patchy white matter hypodensity bilaterally unchanged. Chronic infarct right occipital pole unchanged. Ventricle size normal. Negative for acute infarct, hemorrhage, mass Vascular: Negative for hyperdense vessel Skull: Negative Sinuses/Orbits: Mucosal edema paranasal sinuses.  Negative orbit Other: None ASPECTS ([place] Early CT Score) - Ganglionic level infarction (caudate, lentiform nuclei, internal capsule, insula, M1-M3 cortex): 7 - Supraganglionic infarction (M4-M6 cortex): 3 Total score (0-10 with 10 being normal): 10 IMPRESSION: 1. No acute abnormality. Chronic ischemic changes stable from the prior study. 2. ASPECTS is 10 3. Code stroke imaging results were communicated on 09/06/2020 at 12:47 pm to provider Lindzen via text page Electronically Signed   By: ChaFranchot GalloD.   On: 09/06/2020 12:47    EKG: Independently reviewed. Sinus, no ST-T  changes  Assessment/Plan Active Problems:   Ischemic stroke (HCC)   CVA (cerebral vascular accident) ([place](please populate well all problems here in Problem List. (For example, if patient is on BP meds at home and you resume or decide to hold them, it is a problem that needs to be her. Same for CAD, COPD, HLD and so on)  Left sided paresis and paresthesia -Suspect CVA, might be related to non-compliance with her ASA and Statin -MRI ordered. Neuro consulted. -Hold lisinopril, add PRN hydralazine for permissive HTN.  IDDM -Hold Metformin for 72 hours -Sliding scale and check A1C.  Bipolar disorder -Continue Seroquel  Hx of polysubstance abuse -Continue Suboxone.  Morbid obesity -Diet, calorie control.  DVT prophylaxis: Lovenox Code Status: Full Code Family Communication: None at bedside. Tried to reach significant other via phone, but no one picked up. Disposition Plan: Expect  more than 2 midnight hospital stay for stroke workup and PT evaluation, expect discharge to Rehab. Consults called: Neurology Admission status: Tele admit   Lequita Halt MD [facility] Pager [phone]  09/06/2020, 4:15 PM

## 2020-09-07 ENCOUNTER — Inpatient Hospital Stay (HOSPITAL_COMMUNITY): Payer: Medicaid Other

## 2020-09-07 DIAGNOSIS — F445 Conversion disorder with seizures or convulsions: Secondary | ICD-10-CM | POA: Diagnosis not present

## 2020-09-07 DIAGNOSIS — F319 Bipolar disorder, unspecified: Secondary | ICD-10-CM | POA: Diagnosis not present

## 2020-09-07 DIAGNOSIS — E119 Type 2 diabetes mellitus without complications: Secondary | ICD-10-CM | POA: Diagnosis not present

## 2020-09-07 DIAGNOSIS — D649 Anemia, unspecified: Secondary | ICD-10-CM

## 2020-09-07 DIAGNOSIS — I6389 Other cerebral infarction: Secondary | ICD-10-CM | POA: Diagnosis not present

## 2020-09-07 DIAGNOSIS — Z8673 Personal history of transient ischemic attack (TIA), and cerebral infarction without residual deficits: Secondary | ICD-10-CM | POA: Diagnosis not present

## 2020-09-07 LAB — LIPID PANEL
Cholesterol: 168 mg/dL (ref 0–200)
HDL: 46 mg/dL (ref >40)
LDL Cholesterol: 102 mg/dL — ABNORMAL HIGH (ref 0–99)
Total CHOL/HDL Ratio: 3.7 RATIO
Triglycerides: 99 mg/dL (ref <150)
VLDL: 20 mg/dL (ref 0–40)

## 2020-09-07 LAB — ECHOCARDIOGRAM COMPLETE
Area-P 1/2: 2.91 cm2
Calc EF: 53.4 %
Height: 63 in
S' Lateral: 3.3 cm
Single Plane A2C EF: 56.7 %
Single Plane A4C EF: 49.6 %
Weight: 3227.53 oz

## 2020-09-07 LAB — GLUCOSE, CAPILLARY
Glucose-Capillary: 140 mg/dL — ABNORMAL HIGH (ref 70–99)
Glucose-Capillary: 171 mg/dL — ABNORMAL HIGH (ref 70–99)
Glucose-Capillary: 102 mg/dL — ABNORMAL HIGH (ref 70–99)
Glucose-Capillary: 119 mg/dL — ABNORMAL HIGH (ref 70–99)

## 2020-09-07 LAB — HEMOGLOBIN A1C
Hgb A1c MFr Bld: 7.3 % — ABNORMAL HIGH (ref 4.8–5.6)
Mean Plasma Glucose: 162.81 mg/dL

## 2020-09-07 MED ORDER — ONDANSETRON HCL 4 MG/2ML IJ SOLN
4.0000 mg | Freq: Four times a day (QID) | INTRAMUSCULAR | Status: DC | PRN
  Administered 2020-09-08: 4 mg via INTRAVENOUS
  Filled 2020-09-07 (×2): qty 2

## 2020-09-07 NOTE — Evaluation (Signed)
Occupational Therapy Evaluation Patient Details Name: Kaitlyn Gray MRN: [id number] DOB: Oct 23, 1975 Today's Date: 09/07/2020    History of Present Illness Pt is a 45 y.o. female with PMH of HTN, type 2 DM, MDD, BPD, polysubstance abuse, acute encephalopathy, and hypokalemia presenting to ED with headache, "face twisted", R arm weakness, change in speech, and unsteady gait x 1 week. Stroke work up pending.   Clinical Impression   PTA patient was living alone in a 2-level home with bedroom/bathroom on 2nd floor. Patient reports history of falls. Patient states that she completes ADLs with use of RW and is able to make small meals for herself. Patient does not cook or drive. She reports going to a behavioral health class MWF. The program provides transportation and lunch. Patient also reports having a friend assist with IADLs including meal prep and housekeeping. Patient currently presents with deficits including increased L-sided numbness/weakness and decreased functional use. OT eval limited 2/2 elevated BP. Functional mobility deferred. Patient would benefit from continued acute OT services to maximize safety and independence with self-care tasks and decrease caregiver burden. Given patient CLOF and limited support from family/friends, recommendation for SNF rehab prior to return home.     Follow Up Recommendations  SNF;Supervision/Assistance - 24 hour    Equipment Recommendations  None recommended by OT    Recommendations for Other Services       Precautions / Restrictions Precautions Precautions: Fall Precaution Comments: Behaviors Restrictions Weight Bearing Restrictions: No      Mobility Bed Mobility               General bed mobility comments: Patient seated in recliner upon entry.    Transfers Overall transfer level: Needs assistance Equipment used: 1 person hand held assist Transfers: Sit to/from Stand Sit to Stand: Mod assist         General transfer  comment: Mod A from low recliner with HHA +1. Patient stands with trunk flexed. Does not come to full standing position. Would benefit from +2 assist for further mobility. Reports dizziness upon standing.    Balance Overall balance assessment: Needs assistance Sitting-balance support: No upper extremity supported;Feet supported Sitting balance-Leahy Scale: Good     Standing balance support: Single extremity supported;During functional activity Standing balance-Leahy Scale: Poor Standing balance comment: Reliant on external assist.                           ADL either performed or assessed with clinical judgement   ADL Overall ADL's : Needs assistance/impaired                 Upper Body Dressing : Minimal assistance;Sitting Upper Body Dressing Details (indicate cue type and reason): Patient able to thread BUE through anterior and posterior hospital gowns seated in recliner. Noted increased movement of LUE than during assessment of AROM. Lower Body Dressing: Moderate assistance Lower Body Dressing Details (indicate cue type and reason): With use of RUE, patient able to pull R/L LE over opposite knee to doff/don socks. Noted decreased use of LUE during task.               General ADL Comments: Patient limited by LUE weakness/numbness, dizziness with change in position and need for external assist from others with ADLs.     Vision Baseline Vision/History:  (States needing to wear glassess but doesn't have any.) Patient Visual Report: No change from baseline Vision Assessment?: No apparent visual deficits Additional Comments: Patient  able to correctly identify time on wall clock and able to read large print on white board in room. Difficulty reading fine print far away (baseline).     Perception     Praxis      Pertinent Vitals/Pain Pain Assessment: Faces Pain Score: 10-Worst pain ever Faces Pain Scale: Hurts even more Pain Location: L arm (when taking BP).  Reports tightness in LUE/LLE at rest. Pain Descriptors / Indicators: Aching;Sore Pain Intervention(s): Limited activity within patient's tolerance;Monitored during session;Premedicated before session;Repositioned     Hand Dominance Right   Extremity/Trunk Assessment Upper Extremity Assessment Upper Extremity Assessment: RUE deficits/detail;LUE deficits/detail RUE Deficits / Details: AROM WFL. MMT grossly 4-/5. Good functional use of RUE. RUE Sensation: WNL LUE Deficits / Details: Limited AROM at shoulder. Difficulty lifting arm off lap with assessment of AROM. Unable to maintain position of LUE in space. When asked to don socks and anterior/posterior hospital gown, patient able to flex shoulder ~80-90 degrees. Some functional use of L hand while donning socks. Patient notes numbness/tingling in LUE. LUE Sensation: decreased light touch LUE Coordination: decreased fine motor;decreased gross motor   Lower Extremity Assessment Lower Extremity Assessment: Defer to PT evaluation   Cervical / Trunk Assessment Cervical / Trunk Assessment: Normal   Communication Communication Communication: Expressive difficulties (unchanged from previous CVA)   Cognition Arousal/Alertness: Awake/alert Behavior During Therapy: WFL for tasks assessed/performed Overall Cognitive Status: No family/caregiver present to determine baseline cognitive functioning                                 General Comments: Patient able to state name and DOB. Not able to recall name of hospital. States she came to the ED with pain in L side. Patient follows 1-step verbal commands accurately and can hold casual conversation.   General Comments  Patient reporting dizziness upon standing. BP assessed with BP of 126/102. RN notified.    Exercises     Shoulder Instructions      Home Living Family/patient expects to be discharged to:: Private residence Living Arrangements: Alone Available Help at Discharge:  Friend(s);Available PRN/intermittently Type of Home: Apartment (vs. townhome) Home Access: Stairs to enter Entrance Stairs-Number of Steps: 5-6 Entrance Stairs-Rails: Right Home Layout: Two level Alternate Level Stairs-Number of Steps: flight   Bathroom Shower/Tub: Tub/shower unit;Curtain   Firefighter: Standard     Home Equipment: Environmental consultant - 2 wheels;Bedside commode;Wheelchair - manual          Prior Functioning/Environment Level of Independence: Independent with assistive device(s)        Comments: pt states she has been independent in her house with her RW. pt states she falls a lot        OT Problem List: Decreased strength;Decreased range of motion;Decreased activity tolerance;Impaired balance (sitting and/or standing);Decreased coordination;Decreased knowledge of use of DME or AE;Impaired sensation;Impaired UE functional use;Pain      OT Treatment/Interventions: Self-care/ADL training;Therapeutic exercise;Neuromuscular education;Energy conservation;DME and/or AE instruction;Therapeutic activities;Patient/family education;Balance training    OT Goals(Current goals can be found in the care plan section) Acute Rehab OT Goals Patient Stated Goal: To get better. OT Goal Formulation: With patient Time For Goal Achievement: 09/21/20 Potential to Achieve Goals: Good ADL Goals Additional ADL Goal #1: Patient will complete a.m. ADLs with Mod I, LRAD and good safety awareness. Additional ADL Goal #2: Patient will recall 3 fall prevention techniques to reduce risk of falls in prep for safe return home.  OT Frequency: Min 2X/week   Barriers to D/C: Decreased caregiver support  Lives alone.       Co-evaluation              AM-PAC OT "6 Clicks" Daily Activity     Outcome Measure Help from another person eating meals?: A Little Help from another person taking care of personal grooming?: A Little Help from another person toileting, which includes using toliet,  bedpan, or urinal?: A Lot Help from another person bathing (including washing, rinsing, drying)?: A Lot Help from another person to put on and taking off regular upper body clothing?: A Little Help from another person to put on and taking off regular lower body clothing?: A Lot 6 Click Score: 15   End of Session Equipment Utilized During Treatment: Gait belt Nurse Communication: Mobility status;Other (comment) (Elevated BP)  Activity Tolerance: Treatment limited secondary to medical complications (Comment) (Further mobility deferred 2/2 elevated BP. RN notified.) Patient left: in chair;with call bell/phone within reach;with chair alarm set  OT Visit Diagnosis: Unsteadiness on feet (R26.81);Muscle weakness (generalized) (M62.81);History of falling (Z91.81);Hemiplegia and hemiparesis Hemiplegia - Right/Left: Left Hemiplegia - caused by: Unspecified                Time: 2751-7001 OT Time Calculation (min): 28 min Charges:  OT General Charges $OT Visit: 1 Visit OT Evaluation $OT Eval Moderate Complexity: 1 Mod OT Treatments $Self Care/Home Management : 8-22 mins  Kaitlyn Gray Supplemental OT, Department of rehab services [phone]  Kaitlyn Sampedro R H. 09/07/2020, 11:30 AM

## 2020-09-07 NOTE — NC FL2 (Signed)
[place] MEDICAID FL2 LEVEL OF CARE SCREENING TOOL     IDENTIFICATION  Patient Name: Kaitlyn Gray Birthdate: 07-30-1975 Sex: female Admission Date (Current Location): 09/06/2020  [facility] and [place] Number:  Producer, television/film/video and Address:  [facility], [address], [place], [place] [address]      Provider Number: [id number]  Attending Physician Name and Address:  Marinda Elk, MD  Relative Name and Phone Number:       Current Level of Care: Hospital Recommended Level of Care: Skilled Nursing Facility Prior Approval Number:    Date Approved/Denied:   PASRR Number: Pending  Discharge Plan: SNF    Current Diagnoses: Patient Active Problem List   Diagnosis Date Noted  . CVA (cerebral vascular accident) (HCC) 09/06/2020  . Left-sided weakness   . 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  . Normocytic anemia 02/09/2020  . Ischemic stroke (HCC) 01/01/2020  . Acute encephalopathy 05/12/2018  . Diabetes mellitus without complication (HCC) 12/06/2017  . Hypokalemia 12/06/2017  . Hypertension 12/06/2017  . 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 Bipolar disorder (HCC) 07/19/2017  . Polysubstance abuse (HCC) 07/19/2017  . Chest pain 07/19/2017  . Cocaine use disorder, severe, dependence (HCC) 03/24/2015  . Cannabis use disorder, moderate, dependence (HCC) 03/24/2015  . MDD (major depressive disorder), recurrent severe, 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    Orientation RESPIRATION BLADDER Height & Weight     Self,Time,Situation,Place  Normal Continent Weight: 201 lb 11.5 oz (91.5 kg) Height:  5\' 3"  (160 cm)  BEHAVIORAL SYMPTOMS/MOOD NEUROLOGICAL BOWEL NUTRITION STATUS      Continent Diet (heart healthy/carb modified)  AMBULATORY STATUS COMMUNICATION OF NEEDS Skin   Limited Assist Verbally Normal                       Personal Care Assistance Level of  Assistance  Bathing,Feeding,Dressing Bathing Assistance: Limited assistance Feeding assistance: Independent Dressing Assistance: Limited assistance     Functional Limitations Info  Speech     Speech Info: Impaired (dysarthria)    SPECIAL CARE FACTORS FREQUENCY  PT (By licensed PT),OT (By licensed OT),Speech therapy     PT Frequency: 5x/wk OT Frequency: 5x/wk     Speech Therapy Frequency: 5x/wk      Contractures Contractures Info: Not present    Additional Factors Info  Code Status,Allergies,Psychotropic Code Status Info: Full Allergies Info: Hydrocodone, Latex Psychotropic Info: Seroquel 200mg  daily at bed         Current Medications (09/07/2020):  This is the current hospital active medication list Current Facility-Administered Medications  Medication Dose Route Frequency Provider Last Rate Last Admin  . acetaminophen (TYLENOL) tablet 650 mg  650 mg Oral Q4H PRN T, MD   650 mg at 09/07/20 1104   Or  . acetaminophen (TYLENOL) 160 MG/5ML solution 650 mg  650 mg Per Tube Q4H PRN Mikey College T, MD       Or  . acetaminophen (TYLENOL) suppository 650 mg  650 mg Rectal Q4H PRN 09/09/20 T, MD      . aspirin chewable tablet 81 mg  81 mg Oral Daily Mikey College T, MD   81 mg at 09/07/20 1102  . atorvastatin (LIPITOR) tablet 80 mg  80 mg Oral Daily Mikey College T, MD   80 mg at 09/07/20 1104  . buprenorphine-naloxone (SUBOXONE) 8-2 mg per SL tablet 1 tablet  1 tablet Sublingual  TID Emeline General, MD   1 tablet at 09/07/20 1103  . enoxaparin (LOVENOX) injection 40 mg  40 mg Subcutaneous Q24H Mikey College T, MD   40 mg at 09/06/20 1720  . ferrous sulfate tablet 325 mg  325 mg Oral Daily Mikey College T, MD   325 mg at 09/07/20 1103  . gabapentin (NEURONTIN) capsule 400 mg  400 mg Oral BID Mikey College T, MD   400 mg at 09/07/20 1104  . hydrALAZINE (APRESOLINE) tablet 25 mg  25 mg Oral Q6H PRN Mikey College T, MD      . insulin aspart (novoLOG) injection 0-15 Units  0-15  Units Subcutaneous TID WC Emeline General, MD   3 Units at 09/07/20 1239  . LORazepam (ATIVAN) injection 1 mg  1 mg Intravenous Once PRN Arby Barrette, MD      . ondansetron (ZOFRAN) injection 4 mg  4 mg Intravenous Q6H PRN Marinda Elk, MD      . QUEtiapine (SEROQUEL) tablet 200 mg  200 mg Oral QHS Mikey College T, MD   200 mg at 09/06/20 2253  . senna-docusate (Senokot-S) tablet 1 tablet  1 tablet Oral QHS PRN Emeline General, MD         Discharge Medications: Please see discharge summary for a list of discharge medications.  Relevant Imaging Results:  Relevant Lab Results:   Additional Information SS#: [id number]  Baldemar Lenis, LCSW

## 2020-09-07 NOTE — Progress Notes (Signed)
STROKE TEAM PROGRESS NOTE   SUBJECTIVE (INTERVAL HISTORY) No family is at the bedside.  Overall her condition is stable. Pt is sitting at the edge of bed for lunch, she complains of left shoulder and arm pain, back pain, left ankle, knee pain and she is asking for pain medication.   She stated that she has left sided weakness from prior stroke, and she can not tell me if left side weakness getting worse or when it was getting worse. She felt the left side always weak and on top of it she has left arm and leg and back pain. Her UDS again showed cocaine and THC, she said last time cocaine was one month ago and she does not know if the [facility] she took contained cocaine or not.    OBJECTIVE Temp:  [97.6 F (36.4 C)-99.1 F (37.3 C)] 99.1 F (37.3 C) (03/25 1218) Pulse Rate:  [60-75] 75 (03/25 1218) Cardiac Rhythm: Normal sinus rhythm (03/25 0700) Resp:  [10-20] 16 (03/25 1218) BP: (125-176)/(80-127) 125/90 (03/25 1218) SpO2:  [94 %-100 %] 98 % (03/25 1218) Weight:  [91.5 kg-96.5 kg] 91.5 kg (03/24 1816)  Recent Labs  Lab 09/06/20 1236 09/06/20 1836 09/06/20 2105 09/07/20 0622 09/07/20 1221  GLUCAP 129* 128* 146* 140* 171*   Recent Labs  Lab 09/06/20 1238 09/06/20 1245  NA 140 142  K 3.3* 3.6  CL 107 105  CO2 25  --   GLUCOSE 137* 139*  BUN 10 11  CREATININE 0.93 0.90  CALCIUM 8.6*  --    Recent Labs  Lab 09/06/20 1238  AST 17  ALT 9  ALKPHOS 59  BILITOT 0.4  PROT 6.7  ALBUMIN 3.4*   Recent Labs  Lab 09/06/20 1238 09/06/20 1245  WBC 4.0  --   NEUTROABS 2.1  --   HGB 9.5* 10.5*  HCT 32.8* 31.0*  MCV 78.5*  --   PLT 321  --    No results for input(s): CKTOTAL, CKMB, CKMBINDEX, TROPONINI in the last 168 hours. Recent Labs    09/06/20 1238  LABPROT 13.3  INR 1.1   Recent Labs    09/06/20 1713  COLORURINE AMBER*  LABSPEC 1.025  PHURINE 9.0*  GLUCOSEU NEGATIVE  HGBUR NEGATIVE  BILIRUBINUR MODERATE*  KETONESUR NEGATIVE  PROTEINUR >=300*  NITRITE  NEGATIVE  LEUKOCYTESUR NEGATIVE       Component Value Date/Time   CHOL 168 09/07/2020 0352   TRIG 99 09/07/2020 0352   HDL 46 09/07/2020 0352   CHOLHDL 3.7 09/07/2020 0352   VLDL 20 09/07/2020 0352   LDLCALC 102 (H) 09/07/2020 0352   Lab Results  Component Value Date   HGBA1C 7.3 (H) 09/07/2020      Component Value Date/Time   LABOPIA NONE DETECTED 09/06/2020 1714   COCAINSCRNUR POSITIVE (A) 09/06/2020 1714   LABBENZ POSITIVE (A) 09/06/2020 1714   AMPHETMU NONE DETECTED 09/06/2020 1714   THCU POSITIVE (A) 09/06/2020 1714   LABBARB NONE DETECTED 09/06/2020 1714    Recent Labs  Lab 09/06/20 1232  ETH <10    I have personally reviewed the radiological images below and agree with the radiology interpretations.  CT ANGIO HEAD W OR WO CONTRAST  Result Date: 09/06/2020 CLINICAL DATA:  Left-sided weakness EXAM: CT ANGIOGRAPHY HEAD AND NECK CT PERFUSION BRAIN TECHNIQUE: Multidetector CT imaging of the head and neck was performed using the standard protocol during bolus administration of intravenous contrast. Multiplanar CT image reconstructions and MIPs were obtained to evaluate the vascular anatomy. Carotid  stenosis measurements (when applicable) are obtained utilizing NASCET criteria, using the distal internal carotid diameter as the denominator. Multiphase CT imaging of the brain was performed following IV bolus contrast injection. Subsequent parametric perfusion maps were calculated using RAPID software. CONTRAST:  OMNIPAQUE IOHEXOL 350 MG/ML SOLN COMPARISON:  None. FINDINGS: CTA NECK Aortic arch: Great vessel origins are patent. Right carotid system: Patent. Circumferential plaque along the cervical ICA approximately 1.5 cm from the origin causes nearly 50% stenosis. Partially retropharyngeal course. Left carotid system: Patent. No stenosis at the ICA origin. Partially retropharyngeal course. Vertebral arteries: Patent. Right vertebral artery slightly dominant. No stenosis.  Skeleton: Mild degenerative changes of the cervical spine. Other neck: Negative. Upper chest: No apical lung mass. Review of the MIP images confirms the above findings CTA HEAD Anterior circulation: Intracranial internal carotid arteries are patent with mild stenosis on the left. Anterior cerebral arteries are patent. Middle cerebral arteries are patent. Mild atherosclerotic irregularity of distal ACA and MCA branches. Posterior circulation: Intracranial vertebral arteries, basilar artery, and posterior cerebral arteries are patent. Major cerebellar artery origins are patent. Atherosclerotic irregularity of the basilar and posterior cerebral arteries. Moderate to marked focal stenosis of the right P2 PCA. Mild stenoses on the left. Venous sinuses: Patent as allowed by contrast bolus timing. Review of the MIP images confirms the above findings CT Brain Perfusion Findings: CBF (<30%) Volume: 63mL Perfusion (Tmax>6.0s) volume: 71mL Mismatch Volume: 35mL Infarction Location: None. IMPRESSION: No large vessel occlusion. Circumferential plaque along the proximal right cervical ICA causes nearly 50% stenosis. Multifocal mild intracranial atherosclerosis. Moderate to marked focal stenosis of the right P2 PCA. Initial results were communicated to Dr. Otelia Limes at 1:04 pm on 09/06/2020 by text page via the [facility] messaging system. Electronically Signed   By: Guadlupe Spanish M.D.   On: 09/06/2020 13:10   CT ANGIO NECK W OR WO CONTRAST  Result Date: 09/06/2020 CLINICAL DATA:  Left-sided weakness EXAM: CT ANGIOGRAPHY HEAD AND NECK CT PERFUSION BRAIN TECHNIQUE: Multidetector CT imaging of the head and neck was performed using the standard protocol during bolus administration of intravenous contrast. Multiplanar CT image reconstructions and MIPs were obtained to evaluate the vascular anatomy. Carotid stenosis measurements (when applicable) are obtained utilizing NASCET criteria, using the distal internal carotid diameter as the  denominator. Multiphase CT imaging of the brain was performed following IV bolus contrast injection. Subsequent parametric perfusion maps were calculated using RAPID software. CONTRAST:  OMNIPAQUE IOHEXOL 350 MG/ML SOLN COMPARISON:  None. FINDINGS: CTA NECK Aortic arch: Great vessel origins are patent. Right carotid system: Patent. Circumferential plaque along the cervical ICA approximately 1.5 cm from the origin causes nearly 50% stenosis. Partially retropharyngeal course. Left carotid system: Patent. No stenosis at the ICA origin. Partially retropharyngeal course. Vertebral arteries: Patent. Right vertebral artery slightly dominant. No stenosis. Skeleton: Mild degenerative changes of the cervical spine. Other neck: Negative. Upper chest: No apical lung mass. Review of the MIP images confirms the above findings CTA HEAD Anterior circulation: Intracranial internal carotid arteries are patent with mild stenosis on the left. Anterior cerebral arteries are patent. Middle cerebral arteries are patent. Mild atherosclerotic irregularity of distal ACA and MCA branches. Posterior circulation: Intracranial vertebral arteries, basilar artery, and posterior cerebral arteries are patent. Major cerebellar artery origins are patent. Atherosclerotic irregularity of the basilar and posterior cerebral arteries. Moderate to marked focal stenosis of the right P2 PCA. Mild stenoses on the left. Venous sinuses: Patent as allowed by contrast bolus timing. Review of the MIP  images confirms the above findings CT Brain Perfusion Findings: CBF (<30%) Volume: 36mL Perfusion (Tmax>6.0s) volume: 39mL Mismatch Volume: 42mL Infarction Location: None. IMPRESSION: No large vessel occlusion. Circumferential plaque along the proximal right cervical ICA causes nearly 50% stenosis. Multifocal mild intracranial atherosclerosis. Moderate to marked focal stenosis of the right P2 PCA. Initial results were communicated to Dr. Otelia Limes at 1:04 pm on  09/06/2020 by text page via the [facility] messaging system. Electronically Signed   By: Guadlupe Spanish M.D.   On: 09/06/2020 13:10   MR BRAIN WO CONTRAST  Result Date: 09/06/2020 CLINICAL DATA:  Initial evaluation for possible stroke. EXAM: MRI HEAD WITHOUT CONTRAST TECHNIQUE: Multiplanar, multiecho pulse sequences of the brain and surrounding structures were obtained without intravenous contrast. COMPARISON:  Prior CTs from earlier the same day. FINDINGS: Brain: Examination technically limited as the patient was unable to tolerate the full length of the study. Axial DWI sequence only was performed. Diffusion-weighted sequence demonstrates no evidence for acute or subacute ischemia. Chronic right PCA distribution infarct noted, stable. Gray-white matter differentiation otherwise grossly maintained. No visible mass lesion, mass effect or midline shift. No hydrocephalus or extra-axial fluid collection. Vascular: Not assessed on this limited exam. Skull and upper cervical spine: Not assessed on this limited exam. Sinuses/Orbits: Not assessed on this limited exam. Other: Negative. IMPRESSION: 1. Technically limited exam due to the patient's inability to tolerate the full length of the study. Axial DWI sequence only was performed. 2. No evidence for acute or subacute ischemia. 3. Chronic right PCA distribution infarct, stable. Electronically Signed   By: Rise Mu M.D.   On: 09/06/2020 22:24   CT CEREBRAL PERFUSION W CONTRAST  Result Date: 09/06/2020 CLINICAL DATA:  Left-sided weakness EXAM: CT ANGIOGRAPHY HEAD AND NECK CT PERFUSION BRAIN TECHNIQUE: Multidetector CT imaging of the head and neck was performed using the standard protocol during bolus administration of intravenous contrast. Multiplanar CT image reconstructions and MIPs were obtained to evaluate the vascular anatomy. Carotid stenosis measurements (when applicable) are obtained utilizing NASCET criteria, using the distal internal carotid  diameter as the denominator. Multiphase CT imaging of the brain was performed following IV bolus contrast injection. Subsequent parametric perfusion maps were calculated using RAPID software. CONTRAST:  OMNIPAQUE IOHEXOL 350 MG/ML SOLN COMPARISON:  None. FINDINGS: CTA NECK Aortic arch: Great vessel origins are patent. Right carotid system: Patent. Circumferential plaque along the cervical ICA approximately 1.5 cm from the origin causes nearly 50% stenosis. Partially retropharyngeal course. Left carotid system: Patent. No stenosis at the ICA origin. Partially retropharyngeal course. Vertebral arteries: Patent. Right vertebral artery slightly dominant. No stenosis. Skeleton: Mild degenerative changes of the cervical spine. Other neck: Negative. Upper chest: No apical lung mass. Review of the MIP images confirms the above findings CTA HEAD Anterior circulation: Intracranial internal carotid arteries are patent with mild stenosis on the left. Anterior cerebral arteries are patent. Middle cerebral arteries are patent. Mild atherosclerotic irregularity of distal ACA and MCA branches. Posterior circulation: Intracranial vertebral arteries, basilar artery, and posterior cerebral arteries are patent. Major cerebellar artery origins are patent. Atherosclerotic irregularity of the basilar and posterior cerebral arteries. Moderate to marked focal stenosis of the right P2 PCA. Mild stenoses on the left. Venous sinuses: Patent as allowed by contrast bolus timing. Review of the MIP images confirms the above findings CT Brain Perfusion Findings: CBF (<30%) Volume: 23mL Perfusion (Tmax>6.0s) volume: 61mL Mismatch Volume: 19mL Infarction Location: None. IMPRESSION: No large vessel occlusion. Circumferential plaque along the proximal right cervical ICA  causes nearly 50% stenosis. Multifocal mild intracranial atherosclerosis. Moderate to marked focal stenosis of the right P2 PCA. Initial results were communicated to Dr. Otelia LimesLindzen at  1:04 pm on 09/06/2020 by text page [facility]. Electronically Signed   By: Guadlupe SpanishPraneil  Patel M.D.   On: 09/06/2020 13:10   CT HEAD CODE STROKE WO CONTRAST  Result Date: 09/06/2020 CLINICAL DATA:  Code stroke. Acute neuro deficit. Slurred speech and left arm weakness and numbness EXAM: CT HEAD WITHOUT CONTRAST TECHNIQUE: Contiguous axial images were obtained from the base of the skull through the vertex without intravenous contrast. COMPARISON:  CT head 07/10/2020 FINDINGS: Brain: Patchy white matter hypodensity bilaterally unchanged. Chronic infarct right occipital pole unchanged. Ventricle size normal. Negative for acute infarct, hemorrhage, mass Vascular: Negative for hyperdense vessel Skull: Negative Sinuses/Orbits: Mucosal edema paranasal sinuses.  Negative orbit Other: None ASPECTS ([place] Stroke Program Early CT Score) - Ganglionic level infarction (caudate, lentiform nuclei, internal capsule, insula, M1-M3 cortex): 7 - Supraganglionic infarction (M4-M6 cortex): 3 Total score (0-10 with 10 being normal): 10 IMPRESSION: 1. No acute abnormality. Chronic ischemic changes stable from the prior study. 2. ASPECTS is 10 3. Code stroke imaging results were communicated on 09/06/2020 at 12:47 pm to provider Lindzen via text page Electronically Signed   By: Marlan Palauharles  Clark M.D.   On: 09/06/2020 12:47     PHYSICAL EXAM  Temp:  [97.6 F (36.4 C)-99.1 F (37.3 C)] 99.1 F (37.3 C) (03/25 1218) Pulse Rate:  [60-75] 75 (03/25 1218) Resp:  [10-20] 16 (03/25 1218) BP: (125-176)/(80-127) 125/90 (03/25 1218) SpO2:  [94 %-100 %] 98 % (03/25 1218) Weight:  [91.5 kg-96.5 kg] 91.5 kg (03/24 1816)  General - Well nourished, well developed, in no apparent distress.  Ophthalmologic - fundi not visualized due to noncooperation.  Cardiovascular - Regular rhythm and rate.  Neuro - awake, alert, eyes open, orientated to age, place and people, but not sure about time. No aphasia, talks in a baby  voice, mild dysarthria, fluent language, following all simple commands. Able to name and repeat. No gaze palsy, tracking bilaterally, visual field full, PERRL. No significant facial droop. Tongue midline. RUE and RLE 5/5, left UE 3/5 with pain and giveaway weakness, LLE 3-/5 with lack of effort on exam. Sensation decreased on the left subjectively. Right FTN intact, gait not tested.    ASSESSMENT/PLAN Kaitlyn Gray is a 45 y.o. female with history of HTN, DM, bipolar disorder, pseudoseizure, stroke, substance use admitted for worsening weakness on the left. No tPA given due to outside window.    Recrudescence of left side weakness due to left sided pain vs. Conversion disorder  Resultant left arm and leg pain with chronic left hemiparesis  MRI  Negative for acute infarct  CT no acute finding but chronic ischemic changes  CTA head and neck right ICA nearly 50%, right P2 stenosis, multifocal intracranial stenosis  2D Echo  pending  LDL 102  HgbA1c 7.3  lovenox for VTE prophylaxis  aspirin 81 mg daily prior to admission, now on aspirin 81 mg daily. Continue ASA 81 on discharge  Patient counseled to be compliant with her antithrombotic medications  Ongoing aggressive stroke risk factor management  Therapy recommendations:  pending  Disposition:  pending  Hx of pseudoseizure  02/01/2020 ED visit for unresponsiveness.  However exam concerning for psychogenic source.  EEG negative.  Discharged from ED.  02/12/2020 during admission again for unresponsiveness. EEG normal.   Hx stroke  12/2019 -admitted  for right-sided weakness, numbness and slurred speech.  MRI showed acute/subacute demyelinationvs Subacute infarction R middle cerebral peduncle.  MRA showed Multifocal intracranial atheroscleroticdisease vsvasculitisvs cocaine induced vasculopathy, including left ICA siphon moderate stenosis, left M2, right P2 and left P4 moderate to severe stenosis.  Carotid Doppler  negative.  EF 55 to 60%.  LP w/ slightly elevated protein 69, WBC 4, negative oligoclonol bands.  UDS positive for cocaine.  A1c 7.5, LDL 122.  ANA positive.  Patient left AMA at that time.  Patient diagnosis most likely due to ischemic infarction.  02/09/2020 admitted for right pontine infarct. CT showed right middle cerebellar peduncle old infarct. MRI acute R pontine infarct with evolving right cerebellar peduncle ischemic abnormality. LDL 75 and A1C 6.2. put on DAPT for 3 week then ASA alone.   Hypertension  Stable  Permissive hypertension (OK if < 220/120) but gradually normalize in 5-7 days  Long-term BP goal normotensive  Hyperlipidemia  Home meds: lipitor 80, not sure compliance  LDL 102, goal < 70  Now on lipitor 80  Continue statin at discharge  Diabetes type II uncontrolled  Home meds:  Metformin, insulin  HgbA1c 7.3, goal < 7.0  CBGs  SSI  PCP follow-up for better DM control  Cocaine abuse  Cocaine positive in all previous admission and ED visits  Cocaine again positive this admission  Caucasian education need to be provided this admission again  Tobacco abuse  Current smoker  Smoking cessation counseling provided  Other Stroke Risk Factors  THC abuse. Patient advised to stop using due to stroke risk.  Obesity, Body mass index is 35.73 kg/m., recommend weight loss, diet and exercise as appropriate   Other Active Problems  Bipolar d/o 1  Hospital day # 1  Neurology will sign off. Please call with questions. Pt will follow up with stroke clinic NP at [facility] in about 4 weeks. Thanks for the consult.  Marvel Plan, MD PhD Stroke Neurology 09/07/2020 12:58 PM    To contact Stroke Continuity provider, please refer to [email or web address]. After hours, contact General Neurology

## 2020-09-07 NOTE — Evaluation (Signed)
Physical Therapy Evaluation Patient Details Name: Kaitlyn Gray MRN: [id number] DOB: 07-24-1975 Today's Date: 09/07/2020   History of Present Illness  Pt is a 45 y.o. female admitted on 3/24 with headache, "face twisted", R arm weakness, change in speech, and unsteady gait x 1 week. Stroke work up pending.with PMH of HTN, type 2 DM, MDD, BPD, polysubstance abuse, acute encephalopathy, and hypokalemia   Clinical Impression  Pt fully participated in evaluation. Pt demonstrating weakness in LLE for MMT but able to use with functional mobility tasks. Pt states she was I prior to admission with use of AD but had multiple falls. Pt states she doesn't have anyone to assist her at discharge. Due to pt's deficits in strength, balance and coordination PT recommended OOB S or SNF to decrease fall risk and increase safety. Pt will benefit from skilled PT to address deficits in balance, strength, coordination, endurance, gait and safety to maximize independence with functional mobiltiy prior to discharge.      Follow Up Recommendations Supervision for mobility/OOB;SNF    Equipment Recommendations  None recommended by PT (pt has w/c and RW)    Recommendations for Other Services       Precautions / Restrictions Precautions Precautions: Fall Precaution Comments: Behaviors Restrictions Weight Bearing Restrictions: No      Mobility  Bed Mobility Overal bed mobility: Needs Assistance Bed Mobility: Supine to Sit     Supine to sit: Min assist;HOB elevated     General bed mobility comments: Patient seated in recliner upon entry.    Transfers Overall transfer level: Needs assistance Equipment used: 1 person hand held assist Transfers: Sit to/from [employer] Sit to Stand: Mod assist Stand pivot transfers: Min assist       General transfer comment: performed sit<>stand x 2 trials from EOB with mod A needed to power up, min a need when stepping for balance; would benefit  from +2 to progress ambulation  Ambulation/Gait                Stairs            Wheelchair Mobility    Modified Rankin (Stroke Patients Only)       Balance Overall balance assessment: Needs assistance Sitting-balance support: No upper extremity supported;Feet supported Sitting balance-Leahy Scale: Good     Standing balance support: Single extremity supported;During functional activity Standing balance-Leahy Scale: Poor Standing balance comment: Reliant on external assist.                             Pertinent Vitals/Pain Pain Assessment: Faces Pain Score: 10-Worst pain ever Faces Pain Scale: Hurts even more Pain Location: L arm (when taking BP). Reports tightness in LUE/LLE at rest. Pain Descriptors / Indicators: Aching;Sore Pain Intervention(s): Limited activity within patient's tolerance;Monitored during session;Premedicated before session;Repositioned    Home Living Family/patient expects to be discharged to:: Private residence Living Arrangements: Alone Available Help at Discharge: Friend(s);Available PRN/intermittently Type of Home: Apartment (vs. townhome) Home Access: Stairs to enter Entrance Stairs-Rails: Right Entrance Stairs-Number of Steps: 5-6 Home Layout: Two level Home Equipment: Walker - 2 wheels;Bedside commode;Wheelchair - manual      Prior Function Level of Independence: Independent with assistive device(s)         Comments: pt states she has been independent in her house with her RW. pt states she falls a lot     Hand Dominance   Dominant Hand: Right    Extremity/Trunk  Assessment   Upper Extremity Assessment Upper Extremity Assessment: RUE deficits/detail;LUE deficits/detail RUE Deficits / Details: AROM WFL. MMT grossly 4-/5. Good functional use of RUE. RUE Sensation: WNL LUE Deficits / Details: Limited AROM at shoulder. Difficulty lifting arm off lap with assessment of AROM. Unable to maintain position of LUE  in space. When asked to don socks and anterior/posterior hospital gown, patient able to flex shoulder ~80-90 degrees. Some functional use of L hand while donning socks. Patient notes numbness/tingling in LUE. LUE Sensation: decreased light touch LUE Coordination: decreased fine motor;decreased gross motor    Lower Extremity Assessment Lower Extremity Assessment: RLE deficits/detail;LLE deficits/detail RLE Deficits / Details: weakness from previous stroke RLE Sensation: decreased light touch RLE Coordination: decreased gross motor LLE Deficits / Details: unable to assess with MMT due to pt unable to move, pt able to perofrm sit<>stand with assist but with LLE engaged. hyperextension noted in standing LLE Sensation: decreased light touch LLE Coordination: decreased gross motor    Cervical / Trunk Assessment Cervical / Trunk Assessment: Normal  Communication   Communication: Expressive difficulties (unchanged from previous CVA)  Cognition Arousal/Alertness: Awake/alert Behavior During Therapy: WFL for tasks assessed/performed Overall Cognitive Status: No family/caregiver present to determine baseline cognitive functioning                                 General Comments: Patient able to state name and DOB. Not able to recall name of hospital. States she came to the ED with pain in L side. Patient follows 1-step verbal commands accurately and can hold casual conversation.      General Comments General comments (skin integrity, edema, etc.): Patient reporting dizziness upon standing. BP assessed with BP of 126/102. RN notified.    Exercises     Assessment/Plan    PT Assessment Patient needs continued PT services  PT Problem List Decreased strength;Decreased mobility;Decreased coordination;Decreased activity tolerance;Decreased balance;Decreased cognition;Decreased knowledge of use of DME       PT Treatment Interventions Therapeutic exercise;Gait training;Balance  training;Stair training;Neuromuscular re-education;Therapeutic activities;Patient/family education    PT Goals (Current goals can be found in the Care Plan section)  Acute Rehab PT Goals Patient Stated Goal: to get better PT Goal Formulation: With patient Time For Goal Achievement: 09/21/20 Potential to Achieve Goals: Fair    Frequency Min 3X/week   Barriers to discharge        Co-evaluation               AM-PAC PT "6 Clicks" Mobility  Outcome Measure Help needed turning from your back to your side while in a flat bed without using bedrails?: None Help needed moving from lying on your back to sitting on the side of a flat bed without using bedrails?: A Little Help needed moving to and from a bed to a chair (including a wheelchair)?: A Little Help needed standing up from a chair using your arms (e.g., wheelchair or bedside chair)?: A Lot Help needed to walk in hospital room?: A Lot Help needed climbing 3-5 steps with a railing? : A Lot 6 Click Score: 16    End of Session Equipment Utilized During Treatment: Gait belt Activity Tolerance: Patient tolerated treatment well Patient left: in chair;with chair alarm set;with call bell/phone within reach Nurse Communication: Mobility status PT Visit Diagnosis: Unsteadiness on feet (R26.81);Other abnormalities of gait and mobility (R26.89);Repeated falls (R29.6);Muscle weakness (generalized) (M62.81)    Time: 9767-3419 PT Time Calculation (  min) (ACUTE ONLY): 22 min   Charges:   PT Evaluation $PT Eval Low Complexity: 1 Low          Ginette Otto, DPT Acute Rehabilitation Services [phone]  Lucretia Field 09/07/2020, 12:15 PM

## 2020-09-07 NOTE — TOC Initial Note (Signed)
Transition of Care [facility]) - Initial/Assessment Note    Patient Details  Name: Kaitlyn Gray MRN: [id number] Date of Birth: February 21, 1976  [facility]) CM/SW Contact:    Geralynn Ochs, LCSW Phone Number: 09/07/2020, 3:54 PM  Clinical Narrative:      CSW met with patient to discuss discharge recommendation for SNF. Patient in agreement, concerned because she doesn't have anyone that can help her at home. CSW discussed how finding SNF will be difficult due to her payor source and drug use history, and patient indicated understanding but discussed how she has been getting help for her drug use and wants to quit. CSW to fax out referral and will follow up with any bed offers received. Patient also asked about what she has to do to get an electric wheelchair, that would make it much easier for her to navigate the community. CSW to follow back up with patient.             Expected Discharge Plan: Skilled Nursing Facility Barriers to Discharge: Continued Medical Work up,Inadequate or no insurance,Active Substance Use - Placement   Patient Goals and CMS Choice Patient states their goals for this hospitalization and ongoing recovery are:: to get an electric wheelchair CMS Medicare.gov Compare Post Acute Care list provided to:: Patient Choice offered to / list presented to : Patient  Expected Discharge Plan and Services Expected Discharge Plan: [place] Choice: [place] arrangements for the past 2 months: Single Family Home                                      Prior Living Arrangements/Services Living arrangements for the past 2 months: Single Family Home Lives with:: Self Patient language and need for interpreter reviewed:: No Do you feel safe going back to the place where you live?: Yes      Need for Family Participation in Patient Care: No (Comment) Care giver support system in place?: No (comment)    Criminal Activity/Legal Involvement Pertinent to Current Situation/Hospitalization: No - Comment as needed  Activities of Daily Living      Permission Sought/Granted Permission sought to share information with : Chartered certified accountant granted to share information with : Yes, Verbal Permission Granted     Permission granted to share info w AGENCY: SNF        Emotional Assessment Appearance:: Appears stated age Attitude/Demeanor/Rapport: Engaged Affect (typically observed): Appropriate Orientation: : Oriented to Self,Oriented to Place,Oriented to  Time,Oriented to Situation Alcohol / Substance Use: Illicit Drugs Psych Involvement: Outpatient Provider  Admission diagnosis:  CVA (cerebral vascular accident) ([place]) [I63.9] Left-sided weakness [R53.1] H/O: CVA (cerebrovascular accident) [Z86.73] Patient Active Problem List   Diagnosis Date Noted  . CVA (cerebral vascular accident) ([place]) 09/06/2020  . Left-sided weakness   . 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  . Normocytic anemia 02/09/2020  . Ischemic stroke ([place]) 01/01/2020  . Acute encephalopathy 05/12/2018  . Diabetes mellitus without complication ([place]) 40/98/1191  . Hypokalemia 12/06/2017  . Hypertension 12/06/2017  . 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 ([place]) 07/19/2017  . Bipolar disorder ([place]) 07/19/2017  . Polysubstance abuse ([place]) 07/19/2017  . Chest pain 07/19/2017  . Cocaine use disorder, severe, dependence ([place]) 03/24/2015  . Cannabis use disorder, moderate, dependence ([place]) 03/24/2015  . MDD (major depressive disorder), recurrent  severe, without psychosis ([place]) 03/24/2015   PCP:  Patient, No Pcp Per Pharmacy:   [facility] [place], [address] [place] [address] Phone: [phone] Fax: [phone]     Social Determinants of  Health (SDOH) Interventions    Readmission Risk Interventions No flowsheet data found.

## 2020-09-07 NOTE — TOC CAGE-AID Note (Signed)
Transition of Care [facility]) - CAGE-AID Screening   Patient Details  Name: Kaitlyn Gray MRN: [id number] Date of Birth: [birth year]  Transition of Care [facility]) CM/SW Contact:    Baldemar Lenis, LCSW Phone Number: 09/07/2020, 3:46 PM   Clinical Narrative:   Patient aware that she is an addict, has been involved in counseling for the past two months at [facility]. Patient is still using marijuana, but hasn't used cocaine in over a month, not sure why it's showing up in her system. Patient not interested in other resources as she goes to this program three days a week and finds it to be really helpful for her.    CAGE-AID Screening: Substance Abuse Screening unable to be completed due to: : Patient Refused  Have You Ever Felt You Ought to Cut Down on Your Drinking or Drug Use?: Yes Have People Annoyed You By Critizing Your Drinking Or Drug Use?: No Have You Felt Bad Or Guilty About Your Drinking Or Drug Use?: No Have You Ever Had a Drink or Used Drugs First Thing In The Morning to Steady Your Nerves or to Get Rid of a Hangover?: No CAGE-AID Score: 1  Substance Abuse Education Offered: Yes

## 2020-09-07 NOTE — Progress Notes (Addendum)
[facility] PROGRESS NOTE    Progress Note  Kaitlyn Gray  YCX:[id number] DOB: 16-Jun-1976 DOA: 09/06/2020 PCP: Patient, No Pcp Per     Brief Narrative:   Kaitlyn Gray is an 45 y.o. 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 CT angio of the head and neck showed no large vessel occlusions MRI of the brain was limited no evidence for acute or subacute ischemia, chronic right PCA distribution infarct stable.  Antibiotics: None  Microbiology data: Blood culture:  Procedures: None  Assessment/Plan:   Left-sided weakness: He has been noncompliant with his medication. MRI was negative. Holding lisinopril. Neurology was consulted recommended ischemic stroke work-up. No events on telemetry. HgbA1c 7.3, fasting lipid panel  HDL > 40, LDL 102 PT, OT,pending Start patient on ASA 81mg  daily and plavix 75mg  daily  Atorvastatin 80  BP goal: permissive HTN upto 220/120 mmHg  Insulin-dependent diabetes mellitus His A1c is 7.3, currently on sliding scale insulin.  Essential hypertension Blood pressure has been ranging 1 60-1 40.  Continue to monitor.  Bipolar disorder Tinea Seroquel.  History of polysubstance abuse: Continue Suboxone. UDS was positive for cocaine benzos and marijuana  DVT prophylaxis: lovenox Family Communication:none Status is: Inpatient  Remains inpatient appropriate because:Hemodynamically unstable   Dispo: The patient is from: Home              Anticipated d/c is to: Home              Patient currently is not medically stable to d/c.   Difficult to place patient No  Code Status:     Code Status Orders  (From admission, onward)         Start     Ordered   09/06/20 1610   Full code  Continuous        09/06/20 1610        Code Status History    Date Active Date Inactive Code Status Order ID Comments User Context   02/09/2020 1655 02/14/2020 2245 Full Code 02/11/2020  02/16/2020, MD ED   01/01/2020 1752 01/04/2020 0211 Full Code 01/03/2020  01/06/2020, MD ED   12/16/2019 0919 12/16/2019 1953 Full Code 02/16/2020  02/16/2020, MD ED   05/12/2018 1813 05/13/2018 1607 Full Code 05/14/2018  05/15/2018, MD ED   12/06/2017 1650 12/07/2017 1838 Full Code 12/08/2017  12/09/2017, MD ED   07/19/2017 0443 07/22/2017 1540 Full Code 09/16/2017  09/19/2017, MD ED   03/23/2015 1743 03/24/2015 1726 Full Code 05/23/2015  05/24/2015, FNP Inpatient   03/22/2015 1527 03/23/2015 1700 Full Code 05/22/2015  05/23/2015, MD ED   10/10/2014 2126 10/11/2014 0335 Full Code 2127  10/13/2014, MD ED   03/21/2013 2317 03/23/2013 1251 Full Code 05/21/2013  05/23/2013, MD ED   03/01/2013 2104 03/02/2013 1316 Full Code 2105  Schinlever, 03/04/2013, PA-C ED   Advance Care Planning Activity        IV Access:    Peripheral IV   Procedures and diagnostic studies:   CT ANGIO HEAD W OR WO CONTRAST  Result Date: 09/06/2020 CLINICAL DATA:  Left-sided weakness EXAM: CT ANGIOGRAPHY HEAD AND NECK CT PERFUSION BRAIN TECHNIQUE: Multidetector CT imaging of the head and neck was performed using the standard protocol  during bolus administration of intravenous contrast. Multiplanar CT image reconstructions and MIPs were obtained to evaluate the vascular anatomy. Carotid stenosis measurements (when applicable) are obtained utilizing NASCET criteria, using the distal internal carotid diameter as the denominator. Multiphase CT imaging of the brain was performed following IV bolus contrast injection. Subsequent parametric perfusion maps were calculated using RAPID software. CONTRAST:  OMNIPAQUE IOHEXOL 350 MG/ML SOLN COMPARISON:  None. FINDINGS: CTA NECK Aortic arch: Great  vessel origins are patent. Right carotid system: Patent. Circumferential plaque along the cervical ICA approximately 1.5 cm from the origin causes nearly 50% stenosis. Partially retropharyngeal course. Left carotid system: Patent. No stenosis at the ICA origin. Partially retropharyngeal course. Vertebral arteries: Patent. Right vertebral artery slightly dominant. No stenosis. Skeleton: Mild degenerative changes of the cervical spine. Other neck: Negative. Upper chest: No apical lung mass. Review of the MIP images confirms the above findings CTA HEAD Anterior circulation: Intracranial internal carotid arteries are patent with mild stenosis on the left. Anterior cerebral arteries are patent. Middle cerebral arteries are patent. Mild atherosclerotic irregularity of distal ACA and MCA branches. Posterior circulation: Intracranial vertebral arteries, basilar artery, and posterior cerebral arteries are patent. Major cerebellar artery origins are patent. Atherosclerotic irregularity of the basilar and posterior cerebral arteries. Moderate to marked focal stenosis of the right P2 PCA. Mild stenoses on the left. Venous sinuses: Patent as allowed by contrast bolus timing. Review of the MIP images confirms the above findings CT Brain Perfusion Findings: CBF (<30%) Volume: 44mL Perfusion (Tmax>6.0s) volume: 74mL Mismatch Volume: 56mL Infarction Location: None. IMPRESSION: No large vessel occlusion. Circumferential plaque along the proximal right cervical ICA causes nearly 50% stenosis. Multifocal mild intracranial atherosclerosis. Moderate to marked focal stenosis of the right P2 PCA. Initial results were communicated to Dr. Otelia Limes at 1:04 pm on 09/06/2020 by text page via the [facility] messaging system. Electronically Signed   By: Guadlupe Spanish M.D.   On: 09/06/2020 13:10   CT ANGIO NECK W OR WO CONTRAST  Result Date: 09/06/2020 CLINICAL DATA:  Left-sided weakness EXAM: CT ANGIOGRAPHY HEAD AND NECK CT PERFUSION BRAIN TECHNIQUE:  Multidetector CT imaging of the head and neck was performed using the standard protocol during bolus administration of intravenous contrast. Multiplanar CT image reconstructions and MIPs were obtained to evaluate the vascular anatomy. Carotid stenosis measurements (when applicable) are obtained utilizing NASCET criteria, using the distal internal carotid diameter as the denominator. Multiphase CT imaging of the brain was performed following IV bolus contrast injection. Subsequent parametric perfusion maps were calculated using RAPID software. CONTRAST:  OMNIPAQUE IOHEXOL 350 MG/ML SOLN COMPARISON:  None. FINDINGS: CTA NECK Aortic arch: Great vessel origins are patent. Right carotid system: Patent. Circumferential plaque along the cervical ICA approximately 1.5 cm from the origin causes nearly 50% stenosis. Partially retropharyngeal course. Left carotid system: Patent. No stenosis at the ICA origin. Partially retropharyngeal course. Vertebral arteries: Patent. Right vertebral artery slightly dominant. No stenosis. Skeleton: Mild degenerative changes of the cervical spine. Other neck: Negative. Upper chest: No apical lung mass. Review of the MIP images confirms the above findings CTA HEAD Anterior circulation: Intracranial internal carotid arteries are patent with mild stenosis on the left. Anterior cerebral arteries are patent. Middle cerebral arteries are patent. Mild atherosclerotic irregularity of distal ACA and MCA branches. Posterior circulation: Intracranial vertebral arteries, basilar artery, and posterior cerebral arteries are patent. Major cerebellar artery origins are patent. Atherosclerotic irregularity of the basilar and posterior cerebral arteries. Moderate to marked focal stenosis of the right  P2 PCA. Mild stenoses on the left. Venous sinuses: Patent as allowed by contrast bolus timing. Review of the MIP images confirms the above findings CT Brain Perfusion Findings: CBF (<30%) Volume: 0mL  Perfusion (Tmax>6.0s) volume: 0mL Mismatch Volume: 0mL Infarction Location: None. IMPRESSION: No large vessel occlusion. Circumferential plaque along the proximal right cervical ICA causes nearly 50% stenosis. Multifocal mild intracranial atherosclerosis. Moderate to marked focal stenosis of the right P2 PCA. Initial results were communicated to Dr. Otelia LimesLindzen at 1:04 pm on 09/06/2020 by text page via the [facility] messaging system. Electronically Signed   By: Guadlupe SpanishPraneil  Patel M.D.   On: 09/06/2020 13:10   MR BRAIN WO CONTRAST  Result Date: 09/06/2020 CLINICAL DATA:  Initial evaluation for possible stroke. EXAM: MRI HEAD WITHOUT CONTRAST TECHNIQUE: Multiplanar, multiecho pulse sequences of the brain and surrounding structures were obtained without intravenous contrast. COMPARISON:  Prior CTs from earlier the same day. FINDINGS: Brain: Examination technically limited as the patient was unable to tolerate the full length of the study. Axial DWI sequence only was performed. Diffusion-weighted sequence demonstrates no evidence for acute or subacute ischemia. Chronic right PCA distribution infarct noted, stable. Gray-white matter differentiation otherwise grossly maintained. No visible mass lesion, mass effect or midline shift. No hydrocephalus or extra-axial fluid collection. Vascular: Not assessed on this limited exam. Skull and upper cervical spine: Not assessed on this limited exam. Sinuses/Orbits: Not assessed on this limited exam. Other: Negative. IMPRESSION: 1. Technically limited exam due to the patient's inability to tolerate the full length of the study. Axial DWI sequence only was performed. 2. No evidence for acute or subacute ischemia. 3. Chronic right PCA distribution infarct, stable. Electronically Signed   By: Rise MuBenjamin  McClintock M.D.   On: 09/06/2020 22:24   CT CEREBRAL PERFUSION W CONTRAST  Result Date: 09/06/2020 CLINICAL DATA:  Left-sided weakness EXAM: CT ANGIOGRAPHY HEAD AND NECK CT PERFUSION BRAIN  TECHNIQUE: Multidetector CT imaging of the head and neck was performed using the standard protocol during bolus administration of intravenous contrast. Multiplanar CT image reconstructions and MIPs were obtained to evaluate the vascular anatomy. Carotid stenosis measurements (when applicable) are obtained utilizing NASCET criteria, using the distal internal carotid diameter as the denominator. Multiphase CT imaging of the brain was performed following IV bolus contrast injection. Subsequent parametric perfusion maps were calculated using RAPID software. CONTRAST:  100mL OMNIPAQUE IOHEXOL 350 MG/ML SOLN COMPARISON:  None. FINDINGS: CTA NECK Aortic arch: Great vessel origins are patent. Right carotid system: Patent. Circumferential plaque along the cervical ICA approximately 1.5 cm from the origin causes nearly 50% stenosis. Partially retropharyngeal course. Left carotid system: Patent. No stenosis at the ICA origin. Partially retropharyngeal course. Vertebral arteries: Patent. Right vertebral artery slightly dominant. No stenosis. Skeleton: Mild degenerative changes of the cervical spine. Other neck: Negative. Upper chest: No apical lung mass. Review of the MIP images confirms the above findings CTA HEAD Anterior circulation: Intracranial internal carotid arteries are patent with mild stenosis on the left. Anterior cerebral arteries are patent. Middle cerebral arteries are patent. Mild atherosclerotic irregularity of distal ACA and MCA branches. Posterior circulation: Intracranial vertebral arteries, basilar artery, and posterior cerebral arteries are patent. Major cerebellar artery origins are patent. Atherosclerotic irregularity of the basilar and posterior cerebral arteries. Moderate to marked focal stenosis of the right P2 PCA. Mild stenoses on the left. Venous sinuses: Patent as allowed by contrast bolus timing. Review of the MIP images confirms the above findings CT Brain Perfusion Findings: CBF (<30%) Volume:  0mL Perfusion (Tmax>6.0s)  volume: 71mL Mismatch Volume: 26mL Infarction Location: None. IMPRESSION: No large vessel occlusion. Circumferential plaque along the proximal right cervical ICA causes nearly 50% stenosis. Multifocal mild intracranial atherosclerosis. Moderate to marked focal stenosis of the right P2 PCA. Initial results were communicated to Dr. Otelia Limes at 1:04 pm on 09/06/2020 by text page via the [facility] messaging system. Electronically Signed   By: Guadlupe Spanish M.D.   On: 09/06/2020 13:10   CT HEAD CODE STROKE WO CONTRAST  Result Date: 09/06/2020 CLINICAL DATA:  Code stroke. Acute neuro deficit. Slurred speech and left arm weakness and numbness EXAM: CT HEAD WITHOUT CONTRAST TECHNIQUE: Contiguous axial images were obtained from the base of the skull through the vertex without intravenous contrast. COMPARISON:  CT head 07/10/2020 FINDINGS: Brain: Patchy white matter hypodensity bilaterally unchanged. Chronic infarct right occipital pole unchanged. Ventricle size normal. Negative for acute infarct, hemorrhage, mass Vascular: Negative for hyperdense vessel Skull: Negative Sinuses/Orbits: Mucosal edema paranasal sinuses.  Negative orbit Other: None ASPECTS ([place] Stroke Program Early CT Score) - Ganglionic level infarction (caudate, lentiform nuclei, internal capsule, insula, M1-M3 cortex): 7 - Supraganglionic infarction (M4-M6 cortex): 3 Total score (0-10 with 10 being normal): 10 IMPRESSION: 1. No acute abnormality. Chronic ischemic changes stable from the prior study. 2. ASPECTS is 10 3. Code stroke imaging results were communicated on 09/06/2020 at 12:47 pm to provider Lindzen via text page Electronically Signed   By: Marlan Palau M.D.   On: 09/06/2020 12:47     Medical Consultants:    None.   Subjective:    Kaitlyn Gray relates she feels better this morning.  Objective:    Vitals:   09/06/20 2121 09/07/20 0044 09/07/20 0600 09/07/20 0723  BP: (!) 157/105 (!) 143/80 (!)  150/93 (!) 144/99  Pulse: 65 73 65 63  Resp: 16 14 16 14   Temp: 97.9 F (36.6 C) 97.6 F (36.4 C) 98.2 F (36.8 C) 97.7 F (36.5 C)  TempSrc: Oral Axillary Oral Oral  SpO2: 100% 94% 98% 97%  Weight:      Height:       SpO2: 97 % O2 Flow Rate (L/min): 0 L/min   Intake/Output Summary (Last 24 hours) at 09/07/2020 0828 Last data filed at 09/06/2020 2200 Gross per 24 hour  Intake 480 ml  Output --  Net 480 ml   Filed Weights   09/06/20 1200 09/06/20 1304 09/06/20 1816  Weight: 96.5 kg 96.5 kg 91.5 kg    Exam: General exam: In no acute distress. Respiratory system: Good air movement and clear to auscultation. Cardiovascular system: S1 & S2 heard, RRR. Gastrointestinal system: Abdomen is nondistended, soft and nontender.  Extremities: No pedal edema. Skin: No rashes, lesions or ulcers  Data Reviewed:    Labs: Basic Metabolic Panel: Recent Labs  Lab 09/06/20 1238 09/06/20 1245  NA 140 142  K 3.3* 3.6  CL 107 105  CO2 25  --   GLUCOSE 137* 139*  BUN 10 11  CREATININE 0.93 0.90  CALCIUM 8.6*  --    GFR Estimated Creatinine Clearance: 84.7 mL/min (by C-G formula based on SCr of 0.9 mg/dL). Liver Function Tests: Recent Labs  Lab 09/06/20 1238  AST 17  ALT 9  ALKPHOS 59  BILITOT 0.4  PROT 6.7  ALBUMIN 3.4*   No results for input(s): LIPASE, AMYLASE in the last 168 hours. No results for input(s): AMMONIA in the last 168 hours. Coagulation profile Recent Labs  Lab 09/06/20 1238  INR 1.1   COVID-19  Labs  No results for input(s): DDIMER, FERRITIN, LDH, CRP in the last 72 hours.  Lab Results  Component Value Date   SARSCOV2NAA NEGATIVE 09/06/2020   SARSCOV2NAA NEGATIVE 07/10/2020   SARSCOV2NAA NEGATIVE 02/09/2020   SARSCOV2NAA NEGATIVE 01/20/2020    CBC: Recent Labs  Lab 09/06/20 1238 09/06/20 1245  WBC 4.0  --   NEUTROABS 2.1  --   HGB 9.5* 10.5*  HCT 32.8* 31.0*  MCV 78.5*  --   PLT 321  --    Cardiac Enzymes: No results for  input(s): CKTOTAL, CKMB, CKMBINDEX, TROPONINI in the last 168 hours. BNP (last 3 results) No results for input(s): PROBNP in the last 8760 hours. CBG: Recent Labs  Lab 09/06/20 1236 09/06/20 1836 09/06/20 2105 09/07/20 0622  GLUCAP 129* 128* 146* 140*   D-Dimer: No results for input(s): DDIMER in the last 72 hours. Hgb A1c: Recent Labs    09/06/20 1829 09/07/20 0352  HGBA1C 7.5* 7.3*   Lipid Profile: Recent Labs    09/07/20 0352  CHOL 168  HDL 46  LDLCALC 102*  TRIG 99  CHOLHDL 3.7   Thyroid function studies: No results for input(s): TSH, T4TOTAL, T3FREE, THYROIDAB in the last 72 hours.  Invalid input(s): FREET3 Anemia work up: No results for input(s): VITAMINB12, FOLATE, FERRITIN, TIBC, IRON, RETICCTPCT in the last 72 hours. Sepsis Labs: Recent Labs  Lab 09/06/20 1238  WBC 4.0   Microbiology Recent Results (from the past 240 hour(s))  Resp Panel by RT-PCR (Flu A&B, Covid) Nasopharyngeal Swab     Status: None   Collection Time: 09/06/20 12:37 PM   Specimen: Nasopharyngeal Swab; Nasopharyngeal(NP) swabs in vial transport medium  Result Value Ref Range Status   SARS Coronavirus 2 by RT PCR NEGATIVE NEGATIVE Final    Comment: (NOTE) SARS-CoV-2 target nucleic acids are NOT DETECTED.  The SARS-CoV-2 RNA is generally detectable in upper respiratory specimens during the acute phase of infection. The lowest concentration of SARS-CoV-2 viral copies this assay can detect is 138 copies/mL. A negative result does not preclude SARS-Cov-2 infection and should not be used as the sole basis for treatment or other patient management decisions. A negative result may occur with  improper specimen collection/handling, submission of specimen other than nasopharyngeal swab, presence of viral mutation(s) within the areas targeted by this assay, and inadequate number of viral copies(<138 copies/mL). A negative result must be combined with clinical observations, patient history,  and epidemiological information. The expected result is Negative.  Fact Sheet for Patients:  BloggerCourse.com  Fact Sheet for Healthcare Providers:  SeriousBroker.it  This test is no t yet approved or cleared by the [place] FDA and  has been authorized for detection and/or diagnosis of SARS-CoV-2 by FDA under an Emergency Use Authorization (EUA). This EUA will remain  in effect (meaning this test can be used) for the duration of the COVID-19 declaration under Section 564(b)(1) of the Act, 21 U.S.C.section 360bbb-3(b)(1), unless the authorization is terminated  or revoked sooner.       Influenza A by PCR NEGATIVE NEGATIVE Final   Influenza B by PCR NEGATIVE NEGATIVE Final    Comment: (NOTE) The Xpert Xpress SARS-CoV-2/FLU/RSV plus assay is intended as an aid in the diagnosis of influenza from Nasopharyngeal swab specimens and should not be used as a sole basis for treatment. Nasal washings and aspirates are unacceptable for Xpert Xpress SARS-CoV-2/FLU/RSV testing.  Fact Sheet for Patients: BloggerCourse.com  Fact Sheet for Healthcare Providers: SeriousBroker.it  This test is not yet  approved or cleared by the [place] and has been authorized for detection and/or diagnosis of SARS-CoV-2 by FDA under an Emergency Use Authorization (EUA). This EUA will remain in effect (meaning this test can be used) for the duration of the COVID-19 declaration under Section 564(b)(1) of the Act, 21 U.S.C. section 360bbb-3(b)(1), unless the authorization is terminated or revoked.  Performed at [facility], [address], [place], [place] [address]      Medications:   . aspirin  81 mg Oral Daily  . atorvastatin  80 mg Oral Daily  . buprenorphine-naloxone  1 tablet Sublingual TID  . enoxaparin (LOVENOX) injection  40 mg Subcutaneous Q24H  . ferrous sulfate  325 mg  Oral Daily  . gabapentin  400 mg Oral BID  . insulin aspart  0-15 Units Subcutaneous TID WC  . QUEtiapine  200 mg Oral QHS   Continuous Infusions:    LOS: 1 day   Marinda Elk  [facility]  09/07/2020, 8:28 AM

## 2020-09-07 NOTE — Progress Notes (Signed)
  Echocardiogram 2D Echocardiogram has been performed.  Kaitlyn Gray 09/07/2020, 3:07 PM

## 2020-09-07 NOTE — Progress Notes (Cosign Needed)
Name: Kaitlyn Gray  DOB: 04/14/1976  Please be advised that the above-named patient will require a short-term nursing home stay -- anticipated 30 days or less for rehabilitation and strengthening. The plan is for return home.  

## 2020-09-07 NOTE — Progress Notes (Signed)
Reviewed this patient's chart regarding PIV placement. The patient has pulled 2 PIV's out since admission (1day). Instructed RN to contact MD to discuss POC and changing IV PRN meds to PO. Tomasita Morrow, RN VAST

## 2020-09-08 ENCOUNTER — Other Ambulatory Visit: Payer: Self-pay

## 2020-09-08 DIAGNOSIS — F319 Bipolar disorder, unspecified: Secondary | ICD-10-CM | POA: Diagnosis not present

## 2020-09-08 DIAGNOSIS — E119 Type 2 diabetes mellitus without complications: Secondary | ICD-10-CM | POA: Diagnosis not present

## 2020-09-08 LAB — GLUCOSE, CAPILLARY
Glucose-Capillary: 148 mg/dL — ABNORMAL HIGH (ref 70–99)
Glucose-Capillary: 124 mg/dL — ABNORMAL HIGH (ref 70–99)
Glucose-Capillary: 160 mg/dL — ABNORMAL HIGH (ref 70–99)
Glucose-Capillary: 127 mg/dL — ABNORMAL HIGH (ref 70–99)
Glucose-Capillary: 229 mg/dL — ABNORMAL HIGH (ref 70–99)

## 2020-09-08 MED ORDER — HYDROXYZINE HCL 25 MG PO TABS
25.0000 mg | ORAL_TABLET | Freq: Four times a day (QID) | ORAL | Status: DC | PRN
  Administered 2020-09-10: 25 mg via ORAL
  Filled 2020-09-08 (×2): qty 1

## 2020-09-08 MED ORDER — LISINOPRIL 10 MG PO TABS
10.0000 mg | ORAL_TABLET | Freq: Every day | ORAL | Status: DC
  Administered 2020-09-08 – 2020-09-14 (×7): 10 mg via ORAL
  Filled 2020-09-08 (×7): qty 1

## 2020-09-08 MED ORDER — ATORVASTATIN CALCIUM 80 MG PO TABS
80.0000 mg | ORAL_TABLET | Freq: Every day | ORAL | Status: DC
Start: 2020-09-08 — End: 2020-09-08

## 2020-09-08 NOTE — Progress Notes (Signed)
[facility] PROGRESS NOTE    Progress Note  SERENA PETTERSON  YQI:[id number] DOB: July 18, 1975 DOA: 09/06/2020 PCP: Patient, No Pcp Per     Brief Narrative:   DEBY ADGER is an 45 y.o. 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 CT angio of the head and neck showed no large vessel occlusions MRI of the brain was limited no evidence for acute or subacute ischemia, chronic right PCA distribution infarct stable.  Antibiotics: None  Microbiology data: Blood culture:  Procedures: None  Assessment/Plan:   Left-sided weakness: He has been noncompliant with his medication. Neurology was consulted no events on telemetry stroke work-up negative till date. Likely due to chronic left hemiparesis No events on telemetry. HgbA1c 7.3, fasting lipid panel  HDL > 40, LDL 102 PT, OT,pending  Continue aspirin 81 mg. Physical therapy evaluated the patient recommended skilled nursing facility. Atorvastatin 80 . Discontinue cardiac monitoring.  Insulin-dependent diabetes mellitus His A1c is 7.3, continue sliding scale insulin blood glucose well controlled.  Essential hypertension Blood pressure is elevated resume lisinopril.  Bipolar disorder Continue Seroquel.  History of polysubstance abuse: Continue Suboxone. UDS was positive for cocaine benzos and marijuana  DVT prophylaxis: lovenox Family Communication:none Status is: Inpatient  Remains inpatient appropriate because:Hemodynamically unstable   Dispo: The patient is from: Home              Anticipated d/c is to: Home              Patient currently is not medically stable to d/c.   Difficult to place patient No  Code Status:     Code Status Orders   (From admission, onward)         Start     Ordered   09/06/20 1610  Full code  Continuous        09/06/20 1610        Code Status History    Date Active Date Inactive Code Status Order ID Comments User Context   02/09/2020 1655 02/14/2020 2245 Full Code [id number]  Ollen Bowl, MD ED   01/01/2020 1752 01/04/2020 0211 Full Code [id number]  Glade Lloyd, MD ED   12/16/2019 0919 12/16/2019 1953 Full Code [id number]  Pricilla Loveless, MD ED   05/12/2018 1813 05/13/2018 1607 Full Code [id number]  Burna Cash, MD ED   12/06/2017 1650 12/07/2017 1838 Full Code [id number]  Pearson Grippe, MD ED   07/19/2017 0443 07/22/2017 1540 Full Code [id number]  Clydie Braun, MD ED   03/23/2015 1743 03/24/2015 1726 Full Code [id number]  Beau Fanny, FNP Inpatient   03/22/2015 1527 03/23/2015 1700 Full Code [id number]  Linwood Dibbles, MD ED   10/10/2014 2126 10/11/2014 0335 Full Code [id number]  Derwood Kaplan, MD ED   03/21/2013 2317 03/23/2013 1251 Full Code [id number]  Gerhard Munch, MD ED   03/01/2013 2104 03/02/2013 1316 Full Code [id number]  Schinlever, Arie Sabina, PA-C ED   Advance Care Planning Activity        IV Access:    Peripheral IV   Procedures and diagnostic studies:   CT ANGIO HEAD W OR WO CONTRAST  Result Date: 09/06/2020 CLINICAL DATA:  Left-sided weakness EXAM: CT ANGIOGRAPHY HEAD AND NECK CT PERFUSION BRAIN TECHNIQUE: Multidetector CT imaging of the head and neck was performed using the  standard protocol during bolus administration of intravenous contrast. Multiplanar CT image reconstructions and MIPs were obtained to evaluate the vascular anatomy. Carotid stenosis measurements (when applicable) are obtained utilizing NASCET criteria, using the distal internal carotid diameter as the denominator. Multiphase CT imaging of the brain was performed following IV bolus contrast injection. Subsequent parametric perfusion maps were calculated using RAPID software. CONTRAST:  OMNIPAQUE IOHEXOL  350 MG/ML SOLN COMPARISON:  None. FINDINGS: CTA NECK Aortic arch: Great vessel origins are patent. Right carotid system: Patent. Circumferential plaque along the cervical ICA approximately 1.5 cm from the origin causes nearly 50% stenosis. Partially retropharyngeal course. Left carotid system: Patent. No stenosis at the ICA origin. Partially retropharyngeal course. Vertebral arteries: Patent. Right vertebral artery slightly dominant. No stenosis. Skeleton: Mild degenerative changes of the cervical spine. Other neck: Negative. Upper chest: No apical lung mass. Review of the MIP images confirms the above findings CTA HEAD Anterior circulation: Intracranial internal carotid arteries are patent with mild stenosis on the left. Anterior cerebral arteries are patent. Middle cerebral arteries are patent. Mild atherosclerotic irregularity of distal ACA and MCA branches. Posterior circulation: Intracranial vertebral arteries, basilar artery, and posterior cerebral arteries are patent. Major cerebellar artery origins are patent. Atherosclerotic irregularity of the basilar and posterior cerebral arteries. Moderate to marked focal stenosis of the right P2 PCA. Mild stenoses on the left. Venous sinuses: Patent as allowed by contrast bolus timing. Review of the MIP images confirms the above findings CT Brain Perfusion Findings: CBF (<30%) Volume: 41mL Perfusion (Tmax>6.0s) volume: 36mL Mismatch Volume: 78mL Infarction Location: None. IMPRESSION: No large vessel occlusion. Circumferential plaque along the proximal right cervical ICA causes nearly 50% stenosis. Multifocal mild intracranial atherosclerosis. Moderate to marked focal stenosis of the right P2 PCA. Initial results were communicated to Dr. Otelia Limes at 1:04 pm on 09/06/2020 by text page via the [facility] messaging system. Electronically Signed   By: Guadlupe Spanish M.D.   On: 09/06/2020 13:10   CT ANGIO NECK W OR WO CONTRAST  Result Date: 09/06/2020 CLINICAL DATA:  Left-sided  weakness EXAM: CT ANGIOGRAPHY HEAD AND NECK CT PERFUSION BRAIN TECHNIQUE: Multidetector CT imaging of the head and neck was performed using the standard protocol during bolus administration of intravenous contrast. Multiplanar CT image reconstructions and MIPs were obtained to evaluate the vascular anatomy. Carotid stenosis measurements (when applicable) are obtained utilizing NASCET criteria, using the distal internal carotid diameter as the denominator. Multiphase CT imaging of the brain was performed following IV bolus contrast injection. Subsequent parametric perfusion maps were calculated using RAPID software. CONTRAST:  OMNIPAQUE IOHEXOL 350 MG/ML SOLN COMPARISON:  None. FINDINGS: CTA NECK Aortic arch: Great vessel origins are patent. Right carotid system: Patent. Circumferential plaque along the cervical ICA approximately 1.5 cm from the origin causes nearly 50% stenosis. Partially retropharyngeal course. Left carotid system: Patent. No stenosis at the ICA origin. Partially retropharyngeal course. Vertebral arteries: Patent. Right vertebral artery slightly dominant. No stenosis. Skeleton: Mild degenerative changes of the cervical spine. Other neck: Negative. Upper chest: No apical lung mass. Review of the MIP images confirms the above findings CTA HEAD Anterior circulation: Intracranial internal carotid arteries are patent with mild stenosis on the left. Anterior cerebral arteries are patent. Middle cerebral arteries are patent. Mild atherosclerotic irregularity of distal ACA and MCA branches. Posterior circulation: Intracranial vertebral arteries, basilar artery, and posterior cerebral arteries are patent. Major cerebellar artery origins are patent. Atherosclerotic irregularity of the basilar and posterior cerebral arteries. Moderate to marked focal stenosis of  the right P2 PCA. Mild stenoses on the left. Venous sinuses: Patent as allowed by contrast bolus timing. Review of the MIP images confirms the  above findings CT Brain Perfusion Findings: CBF (<30%) Volume: 0mL Perfusion (Tmax>6.0s) volume: 0mL Mismatch Volume: 0mL Infarction Location: None. IMPRESSION: No large vessel occlusion. Circumferential plaque along the proximal right cervical ICA causes nearly 50% stenosis. Multifocal mild intracranial atherosclerosis. Moderate to marked focal stenosis of the right P2 PCA. Initial results were communicated to Dr. Otelia Limes at 1:04 pm on 09/06/2020 by text page via the [facility] messaging system. Electronically Signed   By: Guadlupe Spanish M.D.   On: 09/06/2020 13:10   MR BRAIN WO CONTRAST  Result Date: 09/06/2020 CLINICAL DATA:  Initial evaluation for possible stroke. EXAM: MRI HEAD WITHOUT CONTRAST TECHNIQUE: Multiplanar, multiecho pulse sequences of the brain and surrounding structures were obtained without intravenous contrast. COMPARISON:  Prior CTs from earlier the same day. FINDINGS: Brain: Examination technically limited as the patient was unable to tolerate the full length of the study. Axial DWI sequence only was performed. Diffusion-weighted sequence demonstrates no evidence for acute or subacute ischemia. Chronic right PCA distribution infarct noted, stable. Gray-white matter differentiation otherwise grossly maintained. No visible mass lesion, mass effect or midline shift. No hydrocephalus or extra-axial fluid collection. Vascular: Not assessed on this limited exam. Skull and upper cervical spine: Not assessed on this limited exam. Sinuses/Orbits: Not assessed on this limited exam. Other: Negative. IMPRESSION: 1. Technically limited exam due to the patient's inability to tolerate the full length of the study. Axial DWI sequence only was performed. 2. No evidence for acute or subacute ischemia. 3. Chronic right PCA distribution infarct, stable. Electronically Signed   By: Rise Mu M.D.   On: 09/06/2020 22:24   CT CEREBRAL PERFUSION W CONTRAST  Result Date: 09/06/2020 CLINICAL DATA:   Left-sided weakness EXAM: CT ANGIOGRAPHY HEAD AND NECK CT PERFUSION BRAIN TECHNIQUE: Multidetector CT imaging of the head and neck was performed using the standard protocol during bolus administration of intravenous contrast. Multiplanar CT image reconstructions and MIPs were obtained to evaluate the vascular anatomy. Carotid stenosis measurements (when applicable) are obtained utilizing NASCET criteria, using the distal internal carotid diameter as the denominator. Multiphase CT imaging of the brain was performed following IV bolus contrast injection. Subsequent parametric perfusion maps were calculated using RAPID software. CONTRAST:  OMNIPAQUE IOHEXOL 350 MG/ML SOLN COMPARISON:  None. FINDINGS: CTA NECK Aortic arch: Great vessel origins are patent. Right carotid system: Patent. Circumferential plaque along the cervical ICA approximately 1.5 cm from the origin causes nearly 50% stenosis. Partially retropharyngeal course. Left carotid system: Patent. No stenosis at the ICA origin. Partially retropharyngeal course. Vertebral arteries: Patent. Right vertebral artery slightly dominant. No stenosis. Skeleton: Mild degenerative changes of the cervical spine. Other neck: Negative. Upper chest: No apical lung mass. Review of the MIP images confirms the above findings CTA HEAD Anterior circulation: Intracranial internal carotid arteries are patent with mild stenosis on the left. Anterior cerebral arteries are patent. Middle cerebral arteries are patent. Mild atherosclerotic irregularity of distal ACA and MCA branches. Posterior circulation: Intracranial vertebral arteries, basilar artery, and posterior cerebral arteries are patent. Major cerebellar artery origins are patent. Atherosclerotic irregularity of the basilar and posterior cerebral arteries. Moderate to marked focal stenosis of the right P2 PCA. Mild stenoses on the left. Venous sinuses: Patent as allowed by contrast bolus timing. Review of the MIP images  confirms the above findings CT Brain Perfusion Findings: CBF (<30%) Volume: 0mL  Perfusion (Tmax>6.0s) volume: 68mL Mismatch Volume: 77mL Infarction Location: None. IMPRESSION: No large vessel occlusion. Circumferential plaque along the proximal right cervical ICA causes nearly 50% stenosis. Multifocal mild intracranial atherosclerosis. Moderate to marked focal stenosis of the right P2 PCA. Initial results were communicated to Dr. Otelia Limes at 1:04 pm on 09/06/2020 by text page via the [facility] messaging system. Electronically Signed   By: Guadlupe Spanish M.D.   On: 09/06/2020 13:10   ECHOCARDIOGRAM COMPLETE  Result Date: 09/07/2020    ECHOCARDIOGRAM REPORT   Patient Name:   VERDELL KINCANNON Date of Exam: 09/07/2020 Medical Rec #:  [id number]         Height:       63.0 in Accession #:    [id number]        Weight:       201.7 lb Date of Birth:  04/19/1976         BSA:          1.941 m Patient Age:    44 years          BP:           144/99 mmHg Patient Gender: F                 HR:           64 bpm. Exam Location:  Inpatient Procedure: 2D Echo, Cardiac Doppler and Color Doppler Indications:    TIA G45.9  History:        Patient has prior history of Echocardiogram examinations, most                 recent 01/02/2020. Stroke; Risk Factors:Hypertension and Current                 Smoker.  Sonographer:    Renella Cunas RDCS Referring Phys: [id number] [facility]  Sonographer Comments: Image acquisition challenging due to respiratory motion. IMPRESSIONS  1. Left ventricular ejection fraction, by estimation, is 65 to 70%. The left ventricle has normal function. The left ventricle has no regional wall motion abnormalities. There is moderate concentric left ventricular hypertrophy. Left ventricular diastolic parameters are consistent with Grade II diastolic dysfunction (pseudonormalization). Elevated left atrial pressure.  2. Right ventricular systolic function is normal. The right ventricular size is normal.  3. Left atrial size was  mildly dilated.  4. The mitral valve is normal in structure. No evidence of mitral valve regurgitation. No evidence of mitral stenosis.  5. The aortic valve is normal in structure. Aortic valve regurgitation is trivial. No aortic stenosis is present.  6. The inferior vena cava is normal in size with greater than 50% respiratory variability, suggesting right atrial pressure of 3 mmHg. Comparison(s): No significant change from prior study. Prior images reviewed side by side. FINDINGS  Left Ventricle: Left ventricular ejection fraction, by estimation, is 65 to 70%. The left ventricle has normal function. The left ventricle has no regional wall motion abnormalities. The left ventricular internal cavity size was normal in size. There is  moderate concentric left ventricular hypertrophy. Left ventricular diastolic parameters are consistent with Grade II diastolic dysfunction (pseudonormalization). Elevated left atrial pressure. Right Ventricle: The right ventricular size is normal. No increase in right ventricular wall thickness. Right ventricular systolic function is normal. Left Atrium: Left atrial size was mildly dilated. Right Atrium: Right atrial size was normal in size. Pericardium: There is no evidence of pericardial effusion. Mitral Valve: The mitral valve is normal in structure. No evidence of  mitral valve regurgitation. No evidence of mitral valve stenosis. Tricuspid Valve: The tricuspid valve is normal in structure. Tricuspid valve regurgitation is not demonstrated. No evidence of tricuspid stenosis. Aortic Valve: The aortic valve is normal in structure. Aortic valve regurgitation is trivial. No aortic stenosis is present. Pulmonic Valve: The pulmonic valve was normal in structure. Pulmonic valve regurgitation is not visualized. No evidence of pulmonic stenosis. Aorta: The aortic root is normal in size and structure. Venous: The inferior vena cava is normal in size with greater than 50% respiratory variability,  suggesting right atrial pressure of 3 mmHg. IAS/Shunts: No atrial level shunt detected by color flow Doppler.  LEFT VENTRICLE PLAX 2D LVIDd:         4.60 cm      Diastology LVIDs:         3.30 cm      LV e' medial:    4.05 cm/s LV PW:         1.60 cm      LV E/e' medial:  18.2 LV IVS:        1.30 cm      LV e' lateral:   5.11 cm/s LVOT diam:     2.20 cm      LV E/e' lateral: 14.4 LV SV:         67 LV SV Index:   34 LVOT Area:     3.80 cm  LV Volumes (MOD) LV vol d, MOD A2C: 104.0 ml LV vol d, MOD A4C: 133.0 ml LV vol s, MOD A2C: 45.0 ml LV vol s, MOD A4C: 67.0 ml LV SV MOD A2C:     59.0 ml LV SV MOD A4C:     133.0 ml LV SV MOD BP:      62.9 ml RIGHT VENTRICLE RV S prime:     11.10 cm/s TAPSE (M-mode): 2.0 cm LEFT ATRIUM           Index       RIGHT ATRIUM           Index LA diam:      3.80 cm 1.96 cm/m  RA Area:     12.30 cm LA Vol (A2C): 24.5 ml 12.62 ml/m RA Volume:   24.00 ml  12.37 ml/m LA Vol (A4C): 38.2 ml 19.68 ml/m  AORTIC VALVE LVOT Vmax:   93.30 cm/s LVOT Vmean:  61.400 cm/s LVOT VTI:    0.176 m  AORTA Ao Root diam: 3.30 cm Ao Asc diam:  3.50 cm MITRAL VALVE MV Area (PHT): 2.91 cm    SHUNTS MV Decel Time: 261 msec    Systemic VTI:  0.18 m MV E velocity: 73.70 cm/s  Systemic Diam: 2.20 cm MV A velocity: 48.00 cm/s MV E/A ratio:  1.54 Mihai Croitoru MD Electronically signed by Thurmon Fair MD Signature Date/Time: 09/07/2020/4:30:56 PM    Final    CT HEAD CODE STROKE WO CONTRAST  Result Date: 09/06/2020 CLINICAL DATA:  Code stroke. Acute neuro deficit. Slurred speech and left arm weakness and numbness EXAM: CT HEAD WITHOUT CONTRAST TECHNIQUE: Contiguous axial images were obtained from the base of the skull through the vertex without intravenous contrast. COMPARISON:  CT head 07/10/2020 FINDINGS: Brain: Patchy white matter hypodensity bilaterally unchanged. Chronic infarct right occipital pole unchanged. Ventricle size normal. Negative for acute infarct, hemorrhage, mass Vascular: Negative for  hyperdense vessel Skull: Negative Sinuses/Orbits: Mucosal edema paranasal sinuses.  Negative orbit Other: None ASPECTS ([place] Stroke Program Early CT Score) - Ganglionic level  infarction (caudate, lentiform nuclei, internal capsule, insula, M1-M3 cortex): 7 - Supraganglionic infarction (M4-M6 cortex): 3 Total score (0-10 with 10 being normal): 10 IMPRESSION: 1. No acute abnormality. Chronic ischemic changes stable from the prior study. 2. ASPECTS is 10 3. Code stroke imaging results were communicated on 09/06/2020 at 12:47 pm to provider [place] via text page Electronically Signed   By: Marlan Palauharles  Clark M.D.   On: 09/06/2020 12:47     Medical Consultants:    None.   Subjective:    Aundra Milletiffany N Cwynar relates some shoulder pain.  Objective:    Vitals:   09/07/20 2357 09/08/20 0136 09/08/20 0347 09/08/20 0403  BP: (!) 151/99  (!) 143/96 132/83  Pulse:      Resp: 12 10 13 11   Temp: 98.7 F (37.1 C)  98.1 F (36.7 C)   TempSrc: Oral  Axillary   SpO2: 97% 99% 98% 96%  Weight:      Height:       SpO2: 96 % O2 Flow Rate (L/min): 0 L/min   Intake/Output Summary (Last 24 hours) at 09/08/2020 0838 Last data filed at 09/08/2020 0615 Gross per 24 hour  Intake --  Output 450 ml  Net -450 ml   Filed Weights   09/06/20 1200 09/06/20 1304 09/06/20 1816  Weight: 96.5 kg 96.5 kg 91.5 kg    Exam: General exam: In no acute distress. Respiratory system: Good air movement and clear to auscultation. Cardiovascular system: S1 & S2 heard, RRR. Gastrointestinal system: Abdomen is nondistended, soft and nontender.  Extremities: No pedal edema. Skin: No rashes, lesions or ulcers  Data Reviewed:    Labs: Basic Metabolic Panel: Recent Labs  Lab 09/06/20 1238 09/06/20 1245  NA 140 142  K 3.3* 3.6  CL 107 105  CO2 25  --   GLUCOSE 137* 139*  BUN 10 11  CREATININE 0.93 0.90  CALCIUM 8.6*  --    GFR Estimated Creatinine Clearance: 84.7 mL/min (by C-G formula based on SCr of 0.9  mg/dL). Liver Function Tests: Recent Labs  Lab 09/06/20 1238  AST 17  ALT 9  ALKPHOS 59  BILITOT 0.4  PROT 6.7  ALBUMIN 3.4*   No results for input(s): LIPASE, AMYLASE in the last 168 hours. No results for input(s): AMMONIA in the last 168 hours. Coagulation profile Recent Labs  Lab 09/06/20 1238  INR 1.1   COVID-19 Labs  No results for input(s): DDIMER, FERRITIN, LDH, CRP in the last 72 hours.  Lab Results  Component Value Date   SARSCOV2NAA NEGATIVE 09/06/2020   SARSCOV2NAA NEGATIVE 07/10/2020   SARSCOV2NAA NEGATIVE 02/09/2020   SARSCOV2NAA NEGATIVE 01/20/2020    CBC: Recent Labs  Lab 09/06/20 1238 09/06/20 1245  WBC 4.0  --   NEUTROABS 2.1  --   HGB 9.5* 10.5*  HCT 32.8* 31.0*  MCV 78.5*  --   PLT 321  --    Cardiac Enzymes: No results for input(s): CKTOTAL, CKMB, CKMBINDEX, TROPONINI in the last 168 hours. BNP (last 3 results) No results for input(s): PROBNP in the last 8760 hours. CBG: Recent Labs  Lab 09/07/20 0622 09/07/20 1221 09/07/20 1544 09/07/20 2146 09/08/20 0649  GLUCAP 140* 171* 102* 119* 148*   D-Dimer: No results for input(s): DDIMER in the last 72 hours. Hgb A1c: Recent Labs    09/06/20 1829 09/07/20 0352  HGBA1C 7.5* 7.3*   Lipid Profile: Recent Labs    09/07/20 0352  CHOL 168  HDL 46  LDLCALC 102*  TRIG  99  CHOLHDL 3.7   Thyroid function studies: No results for input(s): TSH, T4TOTAL, T3FREE, THYROIDAB in the last 72 hours.  Invalid input(s): FREET3 Anemia work up: No results for input(s): VITAMINB12, FOLATE, FERRITIN, TIBC, IRON, RETICCTPCT in the last 72 hours. Sepsis Labs: Recent Labs  Lab 09/06/20 1238  WBC 4.0   Microbiology Recent Results (from the past 240 hour(s))  Resp Panel by RT-PCR (Flu A&B, Covid) Nasopharyngeal Swab     Status: None   Collection Time: 09/06/20 12:37 PM   Specimen: Nasopharyngeal Swab; Nasopharyngeal(NP) swabs in vial transport medium  Result Value Ref Range Status   SARS  Coronavirus 2 by RT PCR NEGATIVE NEGATIVE Final    Comment: (NOTE) SARS-CoV-2 target nucleic acids are NOT DETECTED.  The SARS-CoV-2 RNA is generally detectable in upper respiratory specimens during the acute phase of infection. The lowest concentration of SARS-CoV-2 viral copies this assay can detect is 138 copies/mL. A negative result does not preclude SARS-Cov-2 infection and should not be used as the sole basis for treatment or other patient management decisions. A negative result may occur with  improper specimen collection/handling, submission of specimen other than nasopharyngeal swab, presence of viral mutation(s) within the areas targeted by this assay, and inadequate number of viral copies(<138 copies/mL). A negative result must be combined with clinical observations, patient history, and epidemiological information. The expected result is Negative.  Fact Sheet for Patients:  BloggerCourse.com  Fact Sheet for Healthcare Providers:  SeriousBroker.it  This test is no t yet approved or cleared by the [place] FDA and  has been authorized for detection and/or diagnosis of SARS-CoV-2 by FDA under an Emergency Use Authorization (EUA). This EUA will remain  in effect (meaning this test can be used) for the duration of the COVID-19 declaration under Section 564(b)(1) of the Act, 21 U.S.C.section 360bbb-3(b)(1), unless the authorization is terminated  or revoked sooner.       Influenza A by PCR NEGATIVE NEGATIVE Final   Influenza B by PCR NEGATIVE NEGATIVE Final    Comment: (NOTE) The Xpert Xpress SARS-CoV-2/FLU/RSV plus assay is intended as an aid in the diagnosis of influenza from Nasopharyngeal swab specimens and should not be used as a sole basis for treatment. Nasal washings and aspirates are unacceptable for Xpert Xpress SARS-CoV-2/FLU/RSV testing.  Fact Sheet for  Patients: BloggerCourse.com  Fact Sheet for Healthcare Providers: SeriousBroker.it  This test is not yet approved or cleared by the [place] FDA and has been authorized for detection and/or diagnosis of SARS-CoV-2 by FDA under an Emergency Use Authorization (EUA). This EUA will remain in effect (meaning this test can be used) for the duration of the COVID-19 declaration under Section 564(b)(1) of the Act, 21 U.S.C. section 360bbb-3(b)(1), unless the authorization is terminated or revoked.  Performed at [facility], [address], [place], [place] [address]      Medications:   . aspirin  81 mg Oral Daily  . atorvastatin  80 mg Oral Daily  . buprenorphine-naloxone  1 tablet Sublingual TID  . enoxaparin (LOVENOX) injection  40 mg Subcutaneous Q24H  . ferrous sulfate  325 mg Oral Daily  . gabapentin  400 mg Oral BID  . insulin aspart  0-15 Units Subcutaneous TID WC  . QUEtiapine  200 mg Oral QHS   Continuous Infusions:    LOS: 2 days   Marinda Elk  [facility]  09/08/2020, 8:38 AM

## 2020-09-08 NOTE — Plan of Care (Signed)

## 2020-09-08 NOTE — Progress Notes (Signed)
Physical Therapy Treatment Patient Details Name: Kaitlyn Gray MRN: [id number] DOB: Jul 02, 1975 Today's Date: 09/08/2020    History of Present Illness 45 y/o female presented to ED with acute onset of worsening L sided weakness and facial droop relative to her chronic post-stroke baseline. Current deficits are pre-existing but per patient, are acutely worsening. Head CT and MRI of brain are negative for acute abnormalities. PMH: stroke 2021, type 2 DM, BPD, MDD, polysubstance abuse, hypokalemia    PT Comments    Patient requires minA for OOB mobility with RW for balance and RW management as well as cues for sequencing. Benefits from close chair follow as patient has difficulty with activity pacing. Session limited by patient being fixated on getting phone turned on with carrier and uninterested in performing exercises. Continue to recommend SNF for ongoing Physical Therapy.       Follow Up Recommendations  Supervision for mobility/OOB;SNF     Equipment Recommendations  None recommended by PT (patient has w/c and RW)    Recommendations for Other Services       Precautions / Restrictions Precautions Precautions: Fall Precaution Comments: Behaviors Restrictions Weight Bearing Restrictions: No    Mobility  Bed Mobility Overal bed mobility: Needs Assistance Bed Mobility: Supine to Sit;Sit to Supine     Supine to sit: Min guard Sit to supine: Min guard   General bed mobility comments: min guard for safety    Transfers Overall transfer level: Needs assistance Equipment used: Rolling Jamarious Febo (2 wheeled) Transfers: Sit to/from [employer] Sit to Stand: Min assist Stand pivot transfers: Min assist       General transfer comment: minA for balance and power up into standing. MinA for stand pivot for RW management and cueing for sequencing  Ambulation/Gait Ambulation/Gait assistance: Min assist;+2 safety/equipment Gait Distance (Feet): 12 Feet Assistive  device: Rolling Shraddha Lebron (2 wheeled) Gait Pattern/deviations: Step-through pattern;Decreased stride length;Decreased weight shift to right;Decreased step length - left;Wide base of support Gait velocity: decreased   General Gait Details: difficulty advancing L LE except on command. Close chair follow. MinA for balance, RW management, and cues for sequencing.   Stairs             Wheelchair Mobility    Modified Rankin (Stroke Patients Only)       Balance Overall balance assessment: Needs assistance Sitting-balance support: No upper extremity supported;Feet supported Sitting balance-Leahy Scale: Good     Standing balance support: Bilateral upper extremity supported;During functional activity Standing balance-Leahy Scale: Poor Standing balance comment: Reliant on external assist.                            Cognition Arousal/Alertness: Awake/alert Behavior During Therapy: WFL for tasks assessed/performed Overall Cognitive Status: No family/caregiver present to determine baseline cognitive functioning                                 General Comments: Patient able to state name and DOB. States we are in the ED but unable to state name of hospital      Exercises      General Comments        Pertinent Vitals/Pain Pain Assessment: No/denies pain    Home Living                      Prior Function  PT Goals (current goals can now be found in the care plan section) Acute Rehab PT Goals Patient Stated Goal: to get better PT Goal Formulation: With patient Time For Goal Achievement: 09/21/20 Potential to Achieve Goals: Fair Progress towards PT goals: Progressing toward goals    Frequency    Min 3X/week      PT Plan Current plan remains appropriate    Co-evaluation              AM-PAC PT "6 Clicks" Mobility   Outcome Measure  Help needed turning from your back to your side while in a flat bed without  using bedrails?: A Little Help needed moving from lying on your back to sitting on the side of a flat bed without using bedrails?: A Little Help needed moving to and from a bed to a chair (including a wheelchair)?: A Little Help needed standing up from a chair using your arms (e.g., wheelchair or bedside chair)?: A Little Help needed to walk in hospital room?: A Little Help needed climbing 3-5 steps with a railing? : A Lot 6 Click Score: 17    End of Session Equipment Utilized During Treatment: Gait belt Activity Tolerance: Patient tolerated treatment well Patient left: in bed;with call bell/phone within reach;with bed alarm set Nurse Communication: Mobility status PT Visit Diagnosis: Unsteadiness on feet (R26.81);Other abnormalities of gait and mobility (R26.89);Repeated falls (R29.6);Muscle weakness (generalized) (M62.81)     Time: 4128-7867 PT Time Calculation (min) (ACUTE ONLY): 16 min  Charges:  $Gait Training: 8-22 mins                     Baya Lentz A. Dan Humphreys PT, DPT Acute Rehabilitation Services Pager [phone] Office [phone]    Viviann Spare 09/08/2020, 2:01 PM

## 2020-09-08 NOTE — TOC Progression Note (Signed)
Transition of Care [facility]) - Progression Note    Patient Details  Name: Kaitlyn Gray MRN: [id number] Date of Birth: 21-Feb-1976  Transition of [facility]) CM/SW Contact  Carley Hammed, [place] Phone Number: 09/08/2020, 1:35 PM  Clinical Narrative:    CSW following pt and noted no bed offers at this time. SW will continue to follow for DC planning needs.   Expected Discharge Plan: Skilled Nursing Facility Barriers to Discharge: Continued Medical Work up,Inadequate or no insurance,Active Substance Use - Placement  Expected Discharge Plan and Services Expected Discharge Plan: Skilled Nursing Facility     Post Acute Care Choice: Skilled Nursing Facility Living arrangements for the past 2 months: Single Family Home                                       Social Determinants of Health (SDOH) Interventions    Readmission Risk Interventions No flowsheet data found.

## 2020-09-09 DIAGNOSIS — I10 Essential (primary) hypertension: Secondary | ICD-10-CM | POA: Diagnosis not present

## 2020-09-09 DIAGNOSIS — D649 Anemia, unspecified: Secondary | ICD-10-CM | POA: Diagnosis not present

## 2020-09-09 DIAGNOSIS — E119 Type 2 diabetes mellitus without complications: Secondary | ICD-10-CM | POA: Diagnosis not present

## 2020-09-09 DIAGNOSIS — R531 Weakness: Secondary | ICD-10-CM | POA: Diagnosis not present

## 2020-09-09 LAB — GLUCOSE, CAPILLARY
Glucose-Capillary: 155 mg/dL — ABNORMAL HIGH (ref 70–99)
Glucose-Capillary: 126 mg/dL — ABNORMAL HIGH (ref 70–99)
Glucose-Capillary: 189 mg/dL — ABNORMAL HIGH (ref 70–99)
Glucose-Capillary: 123 mg/dL — ABNORMAL HIGH (ref 70–99)

## 2020-09-09 NOTE — Plan of Care (Signed)
  Problem: Education: Goal: Knowledge of General Education information will improve Description: Including pain rating scale, medication(s)/side effects and non-pharmacologic comfort measures Outcome: Progressing   Problem: Health Behavior/Discharge Planning: Goal: Ability to manage health-related needs will improve Outcome: Progressing   Problem: Clinical Measurements: Goal: Ability to maintain clinical measurements within normal limits will improve Outcome: Progressing Goal: Will remain free from infection Outcome: Progressing Goal: Diagnostic test results will improve Outcome: Progressing Goal: Respiratory complications will improve Outcome: Progressing Goal: Cardiovascular complication will be avoided Outcome: Progressing   Problem: Nutrition: Goal: Adequate nutrition will be maintained Outcome: Progressing   Problem: Coping: Goal: Level of anxiety will decrease Outcome: Progressing   Problem: Elimination: Goal: Will not experience complications related to bowel motility Outcome: Progressing Goal: Will not experience complications related to urinary retention Outcome: Progressing   Problem: Pain Managment: Goal: General experience of comfort will improve Outcome: Progressing   Problem: Safety: Goal: Ability to remain free from injury will improve Outcome: Progressing   Problem: Skin Integrity: Goal: Risk for impaired skin integrity will decrease Outcome: Progressing   Problem: Ischemic Stroke/TIA Tissue Perfusion: Goal: Complications of ischemic stroke/TIA will be minimized Outcome: Progressing

## 2020-09-09 NOTE — Progress Notes (Signed)
[facility] PROGRESS NOTE    Progress Note  Kaitlyn Gray  JXB:[id number] DOB: [birth year] DOA: 09/06/2020 PCP: Patient, No Pcp Per     Brief Narrative:   Kaitlyn Gray is an 45 y.o. 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 CT angio of the head and neck showed no large vessel occlusions MRI of the brain was limited no evidence for acute or subacute ischemia, chronic right PCA distribution infarct stable.  Antibiotics: None  Microbiology data: Blood culture:  Procedures: None  Assessment/Plan:   Left-sided weakness: He has been noncompliant with his medication. Neurology was consulted no events on telemetry stroke work-up negative till date. Likely due to chronic left hemiparesis No events on telemetry. HgbA1c 7.3, fasting lipid panel  HDL > 40, LDL 102 Physical therapy evaluated the patient recommended skilled nursing facility. Continue aspirin 81 mg. Atorvastatin 80 . Discontinue cardiac monitoring.  Insulin-dependent diabetes mellitus His A1c is 7.3, continue sliding scale insulin blood glucose well controlled.  Essential hypertension Blood pressure is elevated resume lisinopril.  Bipolar disorder Continue Seroquel.  History of polysubstance abuse: Continue Suboxone. UDS was positive for cocaine benzos and marijuana  DVT prophylaxis: lovenox Family Communication:none Status is: Inpatient  Remains inpatient appropriate because:Hemodynamically unstable   Dispo: The patient is from: Home              Anticipated d/c is to: Home              Patient currently is not medically stable to d/c.   Difficult to place patient No  Code Status:     Code Status Orders  (From admission,  onward)         Start     Ordered   09/06/20 1610  Full code  Continuous        09/06/20 1610        Code Status History    Date Active Date Inactive Code Status Order ID Comments User Context   02/09/2020 1655 02/14/2020 2245 Full Code [id number]  Ollen BowlPahwani, Rinka R, MD ED   01/01/2020 1752 01/04/2020 0211 Full Code [id number]  Glade LloydAlekh, Kshitiz, MD ED   12/16/2019 0919 12/16/2019 1953 Full Code [id number]  Pricilla LovelessGoldston, Scott, MD ED   05/12/2018 1813 05/13/2018 1607 Full Code [id number]  Burna CashSantos-Sanchez, Idalys, MD ED   12/06/2017 1650 12/07/2017 1838 Full Code [id number]  Pearson GrippeKim, James, MD ED   07/19/2017 0443 07/22/2017 1540 Full Code [id number]  Clydie BraunSmith, Rondell A, MD ED   03/23/2015 1743 03/24/2015 1726 Full Code [id number]  Beau FannyWithrow, John C, FNP Inpatient   03/22/2015 1527 03/23/2015 1700 Full Code [id number]  Linwood DibblesKnapp, Jon, MD ED   10/10/2014 2126 10/11/2014 0335 Full Code [id number]  Derwood KaplanNanavati, Ankit, MD ED   03/21/2013 2317 03/23/2013 1251 Full Code [id number]  Gerhard MunchLockwood, Robert, MD ED   03/01/2013 2104 03/02/2013 1316 Full Code [id number]  Schinlever, Arie Sabinaatherine E, PA-C ED   Advance Care Planning Activity        IV Access:    Peripheral IV   Procedures and diagnostic studies:   ECHOCARDIOGRAM COMPLETE  Result Date: 09/07/2020    ECHOCARDIOGRAM REPORT   Patient Name:   Kaitlyn Gray Date of Exam: 09/07/2020 Medical Rec #:  [id number]         Height:  63.0 in Accession #:    [id number]        Weight:       201.7 lb Date of Birth:  January 20, 1976         BSA:          1.941 m Patient Age:    44 years          BP:           144/99 mmHg Patient Gender: F                 HR:           64 bpm. Exam Location:  Inpatient Procedure: 2D Echo, Cardiac Doppler and Color Doppler Indications:    TIA G45.9  History:        Patient has prior history of Echocardiogram examinations, most                 recent 01/02/2020. Stroke; Risk Factors:Hypertension and Current                 Smoker.  Sonographer:    Renella Cunas RDCS Referring Phys:  [id number] [facility]  Sonographer Comments: Image acquisition challenging due to respiratory motion. IMPRESSIONS  1. Left ventricular ejection fraction, by estimation, is 65 to 70%. The left ventricle has normal function. The left ventricle has no regional wall motion abnormalities. There is moderate concentric left ventricular hypertrophy. Left ventricular diastolic parameters are consistent with Grade II diastolic dysfunction (pseudonormalization). Elevated left atrial pressure.  2. Right ventricular systolic function is normal. The right ventricular size is normal.  3. Left atrial size was mildly dilated.  4. The mitral valve is normal in structure. No evidence of mitral valve regurgitation. No evidence of mitral stenosis.  5. The aortic valve is normal in structure. Aortic valve regurgitation is trivial. No aortic stenosis is present.  6. The inferior vena cava is normal in size with greater than 50% respiratory variability, suggesting right atrial pressure of 3 mmHg. Comparison(s): No significant change from prior study. Prior images reviewed side by side. FINDINGS  Left Ventricle: Left ventricular ejection fraction, by estimation, is 65 to 70%. The left ventricle has normal function. The left ventricle has no regional wall motion abnormalities. The left ventricular internal cavity size was normal in size. There is  moderate concentric left ventricular hypertrophy. Left ventricular diastolic parameters are consistent with Grade II diastolic dysfunction (pseudonormalization). Elevated left atrial pressure. Right Ventricle: The right ventricular size is normal. No increase in right ventricular wall thickness. Right ventricular systolic function is normal. Left Atrium: Left atrial size was mildly dilated. Right Atrium: Right atrial size was normal in size. Pericardium: There is no evidence of pericardial effusion. Mitral Valve: The mitral valve is normal in structure. No evidence of mitral valve regurgitation. No  evidence of mitral valve stenosis. Tricuspid Valve: The tricuspid valve is normal in structure. Tricuspid valve regurgitation is not demonstrated. No evidence of tricuspid stenosis. Aortic Valve: The aortic valve is normal in structure. Aortic valve regurgitation is trivial. No aortic stenosis is present. Pulmonic Valve: The pulmonic valve was normal in structure. Pulmonic valve regurgitation is not visualized. No evidence of pulmonic stenosis. Aorta: The aortic root is normal in size and structure. Venous: The inferior vena cava is normal in size with greater than 50% respiratory variability, suggesting right atrial pressure of 3 mmHg. IAS/Shunts: No atrial level shunt detected by color flow Doppler.  LEFT VENTRICLE PLAX 2D LVIDd:  4.60 cm      Diastology LVIDs:         3.30 cm      LV e' medial:    4.05 cm/s LV PW:         1.60 cm      LV E/e' medial:  18.2 LV IVS:        1.30 cm      LV e' lateral:   5.11 cm/s LVOT diam:     2.20 cm      LV E/e' lateral: 14.4 LV SV:         67 LV SV Index:   34 LVOT Area:     3.80 cm  LV Volumes (MOD) LV vol d, MOD A2C: 104.0 ml LV vol d, MOD A4C: 133.0 ml LV vol s, MOD A2C: 45.0 ml LV vol s, MOD A4C: 67.0 ml LV SV MOD A2C:     59.0 ml LV SV MOD A4C:     133.0 ml LV SV MOD BP:      62.9 ml RIGHT VENTRICLE RV S prime:     11.10 cm/s TAPSE (M-mode): 2.0 cm LEFT ATRIUM           Index       RIGHT ATRIUM           Index LA diam:      3.80 cm 1.96 cm/m  RA Area:     12.30 cm LA Vol (A2C): 24.5 ml 12.62 ml/m RA Volume:   24.00 ml  12.37 ml/m LA Vol (A4C): 38.2 ml 19.68 ml/m  AORTIC VALVE LVOT Vmax:   93.30 cm/s LVOT Vmean:  61.400 cm/s LVOT VTI:    0.176 m  AORTA Ao Root diam: 3.30 cm Ao Asc diam:  3.50 cm MITRAL VALVE MV Area (PHT): 2.91 cm    SHUNTS MV Decel Time: 261 msec    Systemic VTI:  0.18 m MV E velocity: 73.70 cm/s  Systemic Diam: 2.20 cm MV A velocity: 48.00 cm/s MV E/A ratio:  1.54 Mihai Croitoru MD Electronically signed by Thurmon Fair MD Signature  Date/Time: 09/07/2020/4:30:56 PM    Final      Medical Consultants:    None.   Subjective:    Kaitlyn Millet besides shoulder pain otherwise feels okay.  Objective:    Vitals:   09/08/20 1551 09/08/20 2034 09/09/20 0020 09/09/20 0424  BP: (!) 146/85 130/80 (!) 141/85 (!) 155/95  Pulse: 61 69 60 61  Resp: 18 18  18   Temp: 99.2 F (37.3 C) 98.4 F (36.9 C) 98.1 F (36.7 C) 98.6 F (37 C)  TempSrc: Oral Oral Oral   SpO2: 97% 96% 97% 98%  Weight:      Height:       SpO2: 98 % O2 Flow Rate (L/min): 0 L/min   Intake/Output Summary (Last 24 hours) at 09/09/2020 0744 Last data filed at 09/09/2020 0000 Gross per 24 hour  Intake 1360 ml  Output 300 ml  Net 1060 ml   Filed Weights   09/06/20 1200 09/06/20 1304 09/06/20 1816  Weight: 96.5 kg 96.5 kg 91.5 kg    Exam: General exam: In no acute distress. Respiratory system: Good air movement and clear to auscultation. Cardiovascular system: S1 & S2 heard, RRR. Gastrointestinal system: Abdomen is nondistended, soft and nontender.  Extremities: No pedal edema. Skin: No rashes, lesions or ulcers  Data Reviewed:    Labs: Basic Metabolic Panel: Recent Labs  Lab 09/06/20 1238 09/06/20 1245  NA 140 142  K 3.3* 3.6  CL 107 105  CO2 25  --   GLUCOSE 137* 139*  BUN 10 11  CREATININE 0.93 0.90  CALCIUM 8.6*  --    GFR Estimated Creatinine Clearance: 84.7 mL/min (by C-G formula based on SCr of 0.9 mg/dL). Liver Function Tests: Recent Labs  Lab 09/06/20 1238  AST 17  ALT 9  ALKPHOS 59  BILITOT 0.4  PROT 6.7  ALBUMIN 3.4*   No results for input(s): LIPASE, AMYLASE in the last 168 hours. No results for input(s): AMMONIA in the last 168 hours. Coagulation profile Recent Labs  Lab 09/06/20 1238  INR 1.1   COVID-19 Labs  No results for input(s): DDIMER, FERRITIN, LDH, CRP in the last 72 hours.  Lab Results  Component Value Date   SARSCOV2NAA NEGATIVE 09/06/2020   SARSCOV2NAA NEGATIVE 07/10/2020    SARSCOV2NAA NEGATIVE 02/09/2020   SARSCOV2NAA NEGATIVE 01/20/2020    CBC: Recent Labs  Lab 09/06/20 1238 09/06/20 1245  WBC 4.0  --   NEUTROABS 2.1  --   HGB 9.5* 10.5*  HCT 32.8* 31.0*  MCV 78.5*  --   PLT 321  --    Cardiac Enzymes: No results for input(s): CKTOTAL, CKMB, CKMBINDEX, TROPONINI in the last 168 hours. BNP (last 3 results) No results for input(s): PROBNP in the last 8760 hours. CBG: Recent Labs  Lab 09/08/20 0839 09/08/20 1143 09/08/20 1641 09/08/20 2133 09/09/20 0630  GLUCAP 124* 160* 127* 229* 155*   D-Dimer: No results for input(s): DDIMER in the last 72 hours. Hgb A1c: Recent Labs    09/06/20 1829 09/07/20 0352  HGBA1C 7.5* 7.3*   Lipid Profile: Recent Labs    09/07/20 0352  CHOL 168  HDL 46  LDLCALC 102*  TRIG 99  CHOLHDL 3.7   Thyroid function studies: No results for input(s): TSH, T4TOTAL, T3FREE, THYROIDAB in the last 72 hours.  Invalid input(s): FREET3 Anemia work up: No results for input(s): VITAMINB12, FOLATE, FERRITIN, TIBC, IRON, RETICCTPCT in the last 72 hours. Sepsis Labs: Recent Labs  Lab 09/06/20 1238  WBC 4.0   Microbiology Recent Results (from the past 240 hour(s))  Resp Panel by RT-PCR (Flu A&B, Covid) Nasopharyngeal Swab     Status: None   Collection Time: 09/06/20 12:37 PM   Specimen: Nasopharyngeal Swab; Nasopharyngeal(NP) swabs in vial transport medium  Result Value Ref Range Status   SARS Coronavirus 2 by RT PCR NEGATIVE NEGATIVE Final    Comment: (NOTE) SARS-CoV-2 target nucleic acids are NOT DETECTED.  The SARS-CoV-2 RNA is generally detectable in upper respiratory specimens during the acute phase of infection. The lowest concentration of SARS-CoV-2 viral copies this assay can detect is 138 copies/mL. A negative result does not preclude SARS-Cov-2 infection and should not be used as the sole basis for treatment or other patient management decisions. A negative result may occur with  improper  specimen collection/handling, submission of specimen other than nasopharyngeal swab, presence of viral mutation(s) within the areas targeted by this assay, and inadequate number of viral copies(<138 copies/mL). A negative result must be combined with clinical observations, patient history, and epidemiological information. The expected result is Negative.  Fact Sheet for Patients:  BloggerCourse.com  Fact Sheet for Healthcare Providers:  SeriousBroker.it  This test is no t yet approved or cleared by the [place] FDA and  has been authorized for detection and/or diagnosis of SARS-CoV-2 by FDA under an Emergency Use Authorization (EUA). This EUA will remain  in effect (  meaning this test can be used) for the duration of the COVID-19 declaration under Section 564(b)(1) of the Act, 21 U.S.C.section 360bbb-3(b)(1), unless the authorization is terminated  or revoked sooner.       Influenza A by PCR NEGATIVE NEGATIVE Final   Influenza B by PCR NEGATIVE NEGATIVE Final    Comment: (NOTE) The Xpert Xpress SARS-CoV-2/FLU/RSV plus assay is intended as an aid in the diagnosis of influenza from Nasopharyngeal swab specimens and should not be used as a sole basis for treatment. Nasal washings and aspirates are unacceptable for Xpert Xpress SARS-CoV-2/FLU/RSV testing.  Fact Sheet for Patients: BloggerCourse.com  Fact Sheet for Healthcare Providers: SeriousBroker.it  This test is not yet approved or cleared by the [place] FDA and has been authorized for detection and/or diagnosis of SARS-CoV-2 by FDA under an Emergency Use Authorization (EUA). This EUA will remain in effect (meaning this test can be used) for the duration of the COVID-19 declaration under Section 564(b)(1) of the Act, 21 U.S.C. section 360bbb-3(b)(1), unless the authorization is terminated or revoked.  Performed at  [facility], [address] [place], [place] [address]      Medications:   . aspirin  81 mg Oral Daily  . atorvastatin  80 mg Oral Daily  . buprenorphine-naloxone  1 tablet Sublingual TID  . enoxaparin (LOVENOX) injection  40 mg Subcutaneous Q24H  . ferrous sulfate  325 mg Oral Daily  . gabapentin  400 mg Oral BID  . insulin aspart  0-15 Units Subcutaneous TID WC  . lisinopril  10 mg Oral Daily  . QUEtiapine  200 mg Oral QHS   Continuous Infusions:    LOS: 3 days   Marinda Elk  [facility]  09/09/2020, 7:44 AM

## 2020-09-09 NOTE — Plan of Care (Signed)

## 2020-09-10 DIAGNOSIS — E119 Type 2 diabetes mellitus without complications: Secondary | ICD-10-CM | POA: Diagnosis not present

## 2020-09-10 DIAGNOSIS — F319 Bipolar disorder, unspecified: Secondary | ICD-10-CM | POA: Diagnosis not present

## 2020-09-10 LAB — GLUCOSE, CAPILLARY
Glucose-Capillary: 249 mg/dL — ABNORMAL HIGH (ref 70–99)
Glucose-Capillary: 141 mg/dL — ABNORMAL HIGH (ref 70–99)
Glucose-Capillary: 119 mg/dL — ABNORMAL HIGH (ref 70–99)
Glucose-Capillary: 125 mg/dL — ABNORMAL HIGH (ref 70–99)
Glucose-Capillary: 261 mg/dL — ABNORMAL HIGH (ref 70–99)

## 2020-09-10 MED ORDER — INSULIN DETEMIR 100 UNIT/ML ~~LOC~~ SOLN
20.0000 [IU] | Freq: Two times a day (BID) | SUBCUTANEOUS | Status: DC
Start: 2020-09-10 — End: 2020-09-10

## 2020-09-10 MED ORDER — INSULIN ASPART 100 UNIT/ML ~~LOC~~ SOLN
0.0000 [IU] | Freq: Three times a day (TID) | SUBCUTANEOUS | Status: DC
  Administered 2020-09-10: 2 [IU] via SUBCUTANEOUS
  Administered 2020-09-12 – 2020-09-13 (×2): 3 [IU] via SUBCUTANEOUS
  Administered 2020-09-13: 2 [IU] via SUBCUTANEOUS
  Administered 2020-09-14: 3 [IU] via SUBCUTANEOUS

## 2020-09-10 MED ORDER — INSULIN ASPART 100 UNIT/ML ~~LOC~~ SOLN
3.0000 [IU] | Freq: Three times a day (TID) | SUBCUTANEOUS | Status: DC
  Administered 2020-09-10 – 2020-09-14 (×10): 3 [IU] via SUBCUTANEOUS

## 2020-09-10 MED ORDER — INSULIN DETEMIR 100 UNIT/ML ~~LOC~~ SOLN
5.0000 [IU] | Freq: Two times a day (BID) | SUBCUTANEOUS | Status: DC
  Administered 2020-09-10 – 2020-09-14 (×9): 5 [IU] via SUBCUTANEOUS
  Filled 2020-09-10 (×10): qty 0.05

## 2020-09-10 MED ORDER — INSULIN ASPART 100 UNIT/ML ~~LOC~~ SOLN
0.0000 [IU] | Freq: Every day | SUBCUTANEOUS | Status: DC
  Administered 2020-09-10: 3 [IU] via SUBCUTANEOUS

## 2020-09-10 NOTE — Progress Notes (Signed)
[facility] PROGRESS NOTE    Progress Note  JERAE IZARD  XTG:[id number] DOB: [birth year] DOA: 09/06/2020 PCP: Patient, No Pcp Per     Brief Narrative:   EVY LUTTERMAN is an 45 y.o. 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 CT angio of the head and neck showed no large vessel occlusions MRI of the brain was limited no evidence for acute or subacute ischemia, chronic right PCA distribution infarct stable.  Antibiotics: None  Microbiology data: Blood culture:  Procedures: None  Assessment/Plan:   Left-sided weakness: He has been noncompliant with his medication. Neurology was consulted no events on telemetry stroke work-up negative till date. Likely due to chronic left hemiparesis HgbA1c 7.3, fasting lipid panel  HDL > 40, LDL 102 Physical therapy evaluated the patient recommended skilled nursing facility. Continue aspirin 81 mg. Atorvastatin 80 . Discontinue cardiac monitoring.  Insulin-dependent diabetes mellitus A1c is 7.3, will start on long-acting insulin continue sliding scale.  Essential hypertension Blood pressure is elevated resume lisinopril.  Bipolar disorder Continue Seroquel.  History of polysubstance abuse: Continue Suboxone. UDS was positive for cocaine benzos and marijuana  DVT prophylaxis: lovenox Family Communication:none Status is: Inpatient  Remains inpatient appropriate because:Hemodynamically unstable   Dispo: The patient is from: Home              Anticipated d/c is to: Home              Patient currently is not medically stable to d/c.   Difficult to place patient No  Code Status:     Code Status Orders  (From admission, onward)         Start      Ordered   09/06/20 1610  Full code  Continuous        09/06/20 1610        Code Status History    Date Active Date Inactive Code Status Order ID Comments User Context   02/09/2020 1655 02/14/2020 2245 Full Code [id number]  Ollen Bowl, MD ED   01/01/2020 1752 01/04/2020 0211 Full Code [id number]  Glade Lloyd, MD ED   12/16/2019 0919 12/16/2019 1953 Full Code [id number]  Pricilla Loveless, MD ED   05/12/2018 1813 05/13/2018 1607 Full Code [id number]  Burna Cash, MD ED   12/06/2017 1650 12/07/2017 1838 Full Code [id number]  Pearson Grippe, MD ED   07/19/2017 0443 07/22/2017 1540 Full Code [id number]  Clydie Braun, MD ED   03/23/2015 1743 03/24/2015 1726 Full Code [id number]  Beau Fanny, FNP Inpatient   03/22/2015 1527 03/23/2015 1700 Full Code [id number]  Linwood Dibbles, MD ED   10/10/2014 2126 10/11/2014 0335 Full Code [id number]  Derwood Kaplan, MD ED   03/21/2013 2317 03/23/2013 1251 Full Code [id number]  Gerhard Munch, MD ED   03/01/2013 2104 03/02/2013 1316 Full Code [id number]  Schinlever, Haydee Salter ED   Advance Care Planning Activity        IV Access:    Peripheral IV   Procedures and diagnostic studies:   No results found.   Medical Consultants:    None.   Subjective:    NHUNG DANKO relates that shoulder pain is better.  Objective:    Vitals:   09/09/20 1125 09/09/20 1520 09/09/20 2000 09/10/20 0855  BP: (!) 159/92  121/81 (!) 150/84 (!) 147/73  Pulse: 72 68 67 67  Resp: 18  15 16   Temp: 98.9 F (37.2 C) 98.2 F (36.8 C) 99 F (37.2 C) 98.1 F (36.7 C)  TempSrc: Axillary Oral Oral Oral  SpO2: 98% 98% 97% 100%  Weight:      Height:       SpO2: 100 % O2 Flow Rate (L/min): 0 L/min   Intake/Output Summary (Last 24 hours) at 09/10/2020 0923 Last data filed at 09/09/2020 1521 Gross per 24 hour  Intake 711 ml  Output 600 ml  Net 111 ml   Filed Weights   09/06/20 1200 09/06/20 1304 09/06/20 1816  Weight: 96.5 kg 96.5 kg 91.5 kg     Exam: General exam: In no acute distress. Respiratory system: Good air movement and clear to auscultation. Cardiovascular system: S1 & S2 heard, RRR. No JVD. Gastrointestinal system: Abdomen is nondistended, soft and nontender.  Extremities: No pedal edema. Skin: No rashes, lesions or ulcers  Data Reviewed:    Labs: Basic Metabolic Panel: Recent Labs  Lab 09/06/20 1238 09/06/20 1245  NA 140 142  K 3.3* 3.6  CL 107 105  CO2 25  --   GLUCOSE 137* 139*  BUN 10 11  CREATININE 0.93 0.90  CALCIUM 8.6*  --    GFR Estimated Creatinine Clearance: 84.7 mL/min (by C-G formula based on SCr of 0.9 mg/dL). Liver Function Tests: Recent Labs  Lab 09/06/20 1238  AST 17  ALT 9  ALKPHOS 59  BILITOT 0.4  PROT 6.7  ALBUMIN 3.4*   No results for input(s): LIPASE, AMYLASE in the last 168 hours. No results for input(s): AMMONIA in the last 168 hours. Coagulation profile Recent Labs  Lab 09/06/20 1238  INR 1.1   COVID-19 Labs  No results for input(s): DDIMER, FERRITIN, LDH, CRP in the last 72 hours.  Lab Results  Component Value Date   SARSCOV2NAA NEGATIVE 09/06/2020   SARSCOV2NAA NEGATIVE 07/10/2020   SARSCOV2NAA NEGATIVE 02/09/2020   SARSCOV2NAA NEGATIVE 01/20/2020    CBC: Recent Labs  Lab 09/06/20 1238 09/06/20 1245  WBC 4.0  --   NEUTROABS 2.1  --   HGB 9.5* 10.5*  HCT 32.8* 31.0*  MCV 78.5*  --   PLT 321  --    Cardiac Enzymes: No results for input(s): CKTOTAL, CKMB, CKMBINDEX, TROPONINI in the last 168 hours. BNP (last 3 results) No results for input(s): PROBNP in the last 8760 hours. CBG: Recent Labs  Lab 09/09/20 0630 09/09/20 1120 09/09/20 1720 09/09/20 2212 09/10/20 0825  GLUCAP 155* 126* 189* 123* 249*   D-Dimer: No results for input(s): DDIMER in the last 72 hours. Hgb A1c: No results for input(s): HGBA1C in the last 72 hours. Lipid Profile: No results for input(s): CHOL, HDL, LDLCALC, TRIG, CHOLHDL, LDLDIRECT in the last 72  hours. Thyroid function studies: No results for input(s): TSH, T4TOTAL, T3FREE, THYROIDAB in the last 72 hours.  Invalid input(s): FREET3 Anemia work up: No results for input(s): VITAMINB12, FOLATE, FERRITIN, TIBC, IRON, RETICCTPCT in the last 72 hours. Sepsis Labs: Recent Labs  Lab 09/06/20 1238  WBC 4.0   Microbiology Recent Results (from the past 240 hour(s))  Resp Panel by RT-PCR (Flu A&B, Covid) Nasopharyngeal Swab     Status: None   Collection Time: 09/06/20 12:37 PM   Specimen: Nasopharyngeal Swab; Nasopharyngeal(NP) swabs in vial transport medium  Result Value Ref Range Status   SARS Coronavirus 2 by RT PCR NEGATIVE NEGATIVE Final  Comment: (NOTE) SARS-CoV-2 target nucleic acids are NOT DETECTED.  The SARS-CoV-2 RNA is generally detectable in upper respiratory specimens during the acute phase of infection. The lowest concentration of SARS-CoV-2 viral copies this assay can detect is 138 copies/mL. A negative result does not preclude SARS-Cov-2 infection and should not be used as the sole basis for treatment or other patient management decisions. A negative result may occur with  improper specimen collection/handling, submission of specimen other than nasopharyngeal swab, presence of viral mutation(s) within the areas targeted by this assay, and inadequate number of viral copies(<138 copies/mL). A negative result must be combined with clinical observations, patient history, and epidemiological information. The expected result is Negative.  Fact Sheet for Patients:  BloggerCourse.com  Fact Sheet for Healthcare Providers:  SeriousBroker.it  This test is no t yet approved or cleared by the [place] FDA and  has been authorized for detection and/or diagnosis of SARS-CoV-2 by FDA under an Emergency Use Authorization (EUA). This EUA will remain  in effect (meaning this test can be used) for the duration of  the COVID-19 declaration under Section 564(b)(1) of the Act, 21 U.S.C.section 360bbb-3(b)(1), unless the authorization is terminated  or revoked sooner.       Influenza A by PCR NEGATIVE NEGATIVE Final   Influenza B by PCR NEGATIVE NEGATIVE Final    Comment: (NOTE) The Xpert Xpress SARS-CoV-2/FLU/RSV plus assay is intended as an aid in the diagnosis of influenza from Nasopharyngeal swab specimens and should not be used as a sole basis for treatment. Nasal washings and aspirates are unacceptable for Xpert Xpress SARS-CoV-2/FLU/RSV testing.  Fact Sheet for Patients: BloggerCourse.com  Fact Sheet for Healthcare Providers: SeriousBroker.it  This test is not yet approved or cleared by the [place] FDA and has been authorized for detection and/or diagnosis of SARS-CoV-2 by FDA under an Emergency Use Authorization (EUA). This EUA will remain in effect (meaning this test can be used) for the duration of the COVID-19 declaration under Section 564(b)(1) of the Act, 21 U.S.C. section 360bbb-3(b)(1), unless the authorization is terminated or revoked.  Performed at [facility], [address], [place], [place] [address]      Medications:   . aspirin  81 mg Oral Daily  . atorvastatin  80 mg Oral Daily  . buprenorphine-naloxone  1 tablet Sublingual TID  . enoxaparin (LOVENOX) injection  40 mg Subcutaneous Q24H  . ferrous sulfate  325 mg Oral Daily  . gabapentin  400 mg Oral BID  . insulin aspart  0-15 Units Subcutaneous TID WC  . lisinopril  10 mg Oral Daily  . QUEtiapine  200 mg Oral QHS   Continuous Infusions:    LOS: 4 days   Marinda Elk  [facility]  09/10/2020, 9:23 AM

## 2020-09-10 NOTE — Progress Notes (Signed)
Physical Therapy Treatment Patient Details Name: Kaitlyn Gray MRN: [id number] DOB: 1976-05-20 Today's Date: 09/10/2020    History of Present Illness 45 y/o female presented to ED with acute onset of worsening L sided weakness and facial droop relative to her chronic post-stroke baseline. Current deficits are pre-existing but per patient, are acutely worsening. Head CT and MRI of brain are negative for acute abnormalities. PMH: stroke 2021, type 2 DM, BPD, MDD, polysubstance abuse, hypokalemia    PT Comments    Pt resting upon arrival to room, wakes and nearly immediately begins complaining of chest and throat pain. Acute chest pain x4 during session, pt stating pain felt like someone punched her. RN in room for portion of session, performed pt assessment, and notified MD. Chest pain does not appear to be exertional. Pt with very inconsistent presentation during session, with periods of eye closing and rocking back and forth with no explanation. Pt overall requiring min-mod +2 for bed mobility, repeated transfers, and repeated short-distance gait, and has very little activity tolerance at this time. PT continuing to recommend ST-SNF at d/c, will continue to follow .   Follow Up Recommendations  Supervision for mobility/OOB;SNF     Equipment Recommendations  None recommended by PT (patient has w/c and RW)    Recommendations for Other Services       Precautions / Restrictions Precautions Precautions: Fall Precaution Comments: erratic behavior Restrictions Weight Bearing Restrictions: No    Mobility  Bed Mobility Overal bed mobility: Needs Assistance Bed Mobility: Supine to Sit;Sit to Supine     Supine to sit: Min guard Sit to supine: Mod assist   General bed mobility comments: mod assist to bring BLEs back to supine    Transfers Overall transfer level: Needs assistance Equipment used: Rolling walker (2 wheeled) Transfers: Sit to/from Stand Sit to Stand: Mod assist;+2  safety/equipment;+2 physical assistance         General transfer comment: mod assist +2 from EOB and arm chair at sink for power up, rise, and steadying upon standing. Additional assist for controlled descent when moving stand>sit.  Ambulation/Gait Ambulation/Gait assistance: Min assist;+2 safety/equipment;+2 physical assistance Gait Distance (Feet): 8 Feet (x2 - to and from sink) Assistive device: Rolling walker (2 wheeled) Gait Pattern/deviations: Step-through pattern;Decreased stride length;Wide base of support;Trunk flexed Gait velocity: decr   General Gait Details: min assist +2 to steady, navigate RW, and guide pt via truncal assist   Stairs             Wheelchair Mobility    Modified Rankin (Stroke Patients Only)       Balance Overall balance assessment: Needs assistance Sitting-balance support: No upper extremity supported;Feet supported Sitting balance-Leahy Scale: Fair     Standing balance support: Bilateral upper extremity supported;During functional activity Standing balance-Leahy Scale: Poor Standing balance comment: relies on external and BUE support                            Cognition Arousal/Alertness: Awake/alert Behavior During Therapy: Restless Overall Cognitive Status: No family/caregiver present to determine baseline cognitive functioning                                 General Comments: difficult to assess, pt focused on pain today but able to follow simple commands.  Difficulty problem solving, attending to tasks, and poor safety awareness.      Exercises  General Comments General comments (skin integrity, edema, etc.): BP, HR, SpO2, temp WFL      Pertinent Vitals/Pain Pain Assessment: Faces Faces Pain Scale: Hurts even more Pain Location: L arm, chest/throat Pain Descriptors / Indicators: Discomfort;Sore;Other (Comment) ("feels like someone punched me") Pain Intervention(s): Limited activity within  patient's tolerance;Monitored during session;Repositioned;Other (comment) (RN called to room for assessment, RN informed MD)    Home Living                      Prior Function            PT Goals (current goals can now be found in the care plan section) Acute Rehab PT Goals Patient Stated Goal: to get better PT Goal Formulation: With patient Time For Goal Achievement: 09/21/20 Potential to Achieve Goals: Fair Progress towards PT goals: Progressing toward goals    Frequency    Min 2X/week      PT Plan Current plan remains appropriate    Co-evaluation PT/OT/SLP Co-Evaluation/Treatment: Yes Reason for Co-Treatment: Necessary to address cognition/behavior during functional activity;For patient/therapist safety;To address functional/ADL transfers PT goals addressed during session: Mobility/safety with mobility;Balance OT goals addressed during session: ADL's and self-care      AM-PAC PT "6 Clicks" Mobility   Outcome Measure  Help needed turning from your back to your side while in a flat bed without using bedrails?: A Little Help needed moving from lying on your back to sitting on the side of a flat bed without using bedrails?: A Little Help needed moving to and from a bed to a chair (including a wheelchair)?: A Lot Help needed standing up from a chair using your arms (e.g., wheelchair or bedside chair)?: A Lot Help needed to walk in hospital room?: A Little Help needed climbing 3-5 steps with a railing? : A Lot 6 Click Score: 15    End of Session Equipment Utilized During Treatment: Gait belt Activity Tolerance: Patient limited by fatigue;Patient limited by pain Patient left: in bed;with call bell/phone within reach;with bed alarm set Nurse Communication: Mobility status PT Visit Diagnosis: Unsteadiness on feet (R26.81);Other abnormalities of gait and mobility (R26.89);Repeated falls (R29.6);Muscle weakness (generalized) (M62.81)     Time: 3762-8315 PT Time  Calculation (min) (ACUTE ONLY): 30 min  Charges:  $Gait Training: 8-22 mins                    Marye Round, PT Acute Rehabilitation Services Pager [phone]  Office [phone]   Truddie Coco 09/10/2020, 12:25 PM

## 2020-09-10 NOTE — Progress Notes (Signed)
RN notified by PT/OT that pt. is c/o chest pain and sore throat. Nurse in to assess; VS WNL and CBG-141. Pt. also c/o left arm pain, which pt. states has been bothering her for a while now. Pt. declined Tylenol for second time. Notified MD; no new orders at this time. Will continue to monitor pt.

## 2020-09-10 NOTE — Evaluation (Signed)
Speech Language Pathology Evaluation Patient Details Name: Kaitlyn Gray MRN: [id number] DOB: 1976-03-26 Today's Date: 09/10/2020 Time: 2297-9892 SLP Time Calculation (min) (ACUTE ONLY): 25 min  Problem List:  Patient Active Problem List   Diagnosis Date Noted  . CVA (cerebral vascular accident) (HCC) 09/06/2020  . Left-sided weakness   . 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  . Normocytic anemia 02/09/2020  . Ischemic stroke (HCC) 01/01/2020  . Acute encephalopathy 05/12/2018  . Diabetes mellitus without complication (HCC) 12/06/2017  . Hypokalemia 12/06/2017  . Hypertension 12/06/2017  . 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 Bipolar disorder (HCC) 07/19/2017  . Polysubstance abuse (HCC) 07/19/2017  . Chest pain 07/19/2017  . Cocaine use disorder, severe, dependence (HCC) 03/24/2015  . Cannabis use disorder, moderate, dependence (HCC) 03/24/2015  . MDD (major depressive disorder), recurrent severe, 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   Past Medical History:  Past Medical History:  Diagnosis Date  . Bipolar 1 disorder (HCC)   . 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 Hypertension   . Stroke [facility])    Past Surgical History:  Past Surgical History:  Procedure Laterality Date  . CESAREAN SECTION     HPI:  45 y/o female presented to ED with acute onset of worsening L sided weakness and facial droop relative to her chronic post-stroke baseline. Current deficits are pre-existing but per patient, are acutely worsening. Head CT and MRI of brain are negative for acute abnormalities. PMH: stroke 2021, type 2 DM, BPD, MDD, polysubstance abuse, hypokalemia   Assessment / Plan / Recommendation Clinical Impression  Pt presents with a mild impairment as evidenced by her score of 22 out of 30 on the SLUMS (21-26 is considered mild). She also received this score on  during her previous admission on 01/22/2020. Her strengths included immediate recall, generative naming, following one-step commands, and sustained attention. However, she was disoriented to the time and location and demonstrated difficulty in areas such as word-finding, STM, basic problem solving, and working [employer] fast streamer. With verbal cues, pt showed some improvements in areas such as STM and problem solving. Pt demonstrates emergent awareness given that she reports subjective difficulty with her speech since her previous CVA in 2021 which she also states has gotten worse recently. She initiated spontaneous speech at the sentence level and was intelligible at the word level with intelligibility decreasing as length of utterances increased. Based on these findings, cognition appears to be at baseline but she would benefit from speech therapy at her next level of care to address motor speech.     SLP Assessment  SLP Recommendation/Assessment: All further Speech Lanaguage Pathology  needs can be addressed in the next venue of care SLP Visit Diagnosis: Cognitive communication deficit (R41.841)    Follow Up Recommendations  Skilled Nursing facility;24 hour supervision/assistance    Frequency and Duration           SLP Evaluation Cognition  Overall Cognitive Status: No family/caregiver present to determine baseline cognitive functioning Arousal/Alertness: Awake/alert Orientation Level: Disoriented to place;Disoriented to time Attention: Sustained Sustained Attention: Appears intact Memory: Impaired Memory Impairment: Retrieval deficit Awareness: Impaired Awareness Impairment: Anticipatory impairment Problem Solving: Impaired Problem Solving Impairment: Verbal basic Safety/Judgment: Impaired       Comprehension  Auditory Comprehension Overall Auditory Comprehension: Impaired Commands: Within Functional Limits Conversation: Simple Interfering Components: Other (comment) (Previous  CVA) EffectiveTechniques: Extra processing time;Increased volume;Pausing;Repetition;Slowed speech;Stressing words    Expression  Expression Primary Mode of Expression: Verbal Verbal Expression Overall Verbal Expression: Appears within functional limits for tasks assessed Initiation: No impairment Level of Generative/Spontaneous Verbalization: Sentence Written Expression Dominant Hand: Right Written Expression: Within Functional Limits   Oral / Motor  Motor Speech Overall Motor Speech: Other (comment) (Slightly slurred, reports subjective difficulty) Respiration: Within functional limits Phonation: Normal Resonance: Within functional limits Articulation: Impaired Level of Impairment: Sentence Intelligibility: Intelligibility reduced Sentence: 50-74% accurate Motor Planning: Witnin functional limits Motor Speech Errors: Not applicable Interfering Components:  (Previous stroke in 2021)   GO                    Zettie Cooley., SLP Student 09/10/2020, 3:04 PM

## 2020-09-10 NOTE — Progress Notes (Signed)
Occupational Therapy Treatment Patient Details Name: Kaitlyn Gray MRN: [id number] DOB: [birth year] Today's Date: 09/10/2020    History of present illness 45 y/o female presented to ED with acute onset of worsening L sided weakness and facial droop relative to her chronic post-stroke baseline. Current deficits are pre-existing but per patient, are acutely worsening. Head CT and MRI of brain are negative for acute abnormalities. PMH: stroke 2021, type 2 DM, BPD, MDD, polysubstance abuse, hypokalemia   OT comments  Patient supine in bed and agreeable to OT/PT session.  Reports throat, chest and L UE pain, RN notified and assessed patient-- VSS.  Patient requires mod assist +2 for transfer and in room mobility today, min assist for grooming tasks as sink with noted poor functional use of L UE requiring cueing to engage use of LUE during tasks. Pt remains limited by weakness, unsteadiness, and decreased safety awareness.  Pt asked multiple times "am I going home today" with no awareness to why this is not a good idea. Continued to recommend SNF. OT to follow.    Follow Up Recommendations  SNF;Supervision/Assistance - 24 hour    Equipment Recommendations  None recommended by OT    Recommendations for Other Services      Precautions / Restrictions Precautions Precautions: Fall Precaution Comments: Behaviors Restrictions Weight Bearing Restrictions: No       Mobility Bed Mobility Overal bed mobility: Needs Assistance Bed Mobility: Supine to Sit;Sit to Supine     Supine to sit: Min guard Sit to supine: Mod assist   General bed mobility comments: mod assist to bring BLEs back to supine    Transfers Overall transfer level: Needs assistance Equipment used: Rolling walker (2 wheeled) Transfers: Sit to/from Stand Sit to Stand: Mod assist;+2 safety/equipment         General transfer comment: mod assist +2 from EOB and arm chair at sink, patient pulling on RW.    Balance  Overall balance assessment: Needs assistance Sitting-balance support: No upper extremity supported;Feet supported Sitting balance-Leahy Scale: Fair     Standing balance support: Bilateral upper extremity supported;During functional activity Standing balance-Leahy Scale: Poor Standing balance comment: relies on external and BUE support                           ADL either performed or assessed with clinical judgement   ADL Overall ADL's : Needs assistance/impaired     Grooming: Minimal assistance;Wash/dry face;Applying deodorant;Sitting Grooming Details (indicate cue type and reason): requires assist to apply deodorant to R underarm due to L arm pain and decreased functional use                 Toilet Transfer: Moderate assistance;+2 for physical assistance;+2 for safety/equipment;Ambulation;RW Toilet Transfer Details (indicate cue type and reason): simulated in room         Functional mobility during ADLs: Moderate assistance;+2 for physical assistance;+2 for safety/equipment;Rolling walker;Cueing for sequencing;Cueing for safety General ADL Comments: pt remains limited by cognition, L sided weakness/UE pain.     Vision       Perception     Praxis      Cognition Arousal/Alertness: Awake/alert Behavior During Therapy: Restless Overall Cognitive Status: No family/caregiver present to determine baseline cognitive functioning                                 General Comments: difficult to assess, pt focused  on pain today but able to follow simple commands.  Difficulty problem solving, attending to tasks, and poor safety awareness.        Exercises     Shoulder Instructions       General Comments Vitals WFL.  RN present and assessing due to reports of chest pain.  MD notified as well.    Pertinent Vitals/ Pain       Pain Assessment: Faces Faces Pain Scale: Hurts even more Pain Location: L arm, chest/throat Pain Descriptors /  Indicators: Discomfort;Sore (reports "feels like someone punched me") Pain Intervention(s): Limited activity within patient's tolerance;Monitored during session;Repositioned;Utilized relaxation techniques;Other (comment) (RN present and aware)  Home Living                                          Prior Functioning/Environment              Frequency  Min 2X/week        Progress Toward Goals  OT Goals(current goals can now be found in the care plan section)  Progress towards OT goals: Not progressing toward goals - comment (pain, requires +2 for transfers today)  Acute Rehab OT Goals Patient Stated Goal: feel better OT Goal Formulation: With patient  Plan Discharge plan remains appropriate;Frequency remains appropriate    Co-evaluation    PT/OT/SLP Co-Evaluation/Treatment: Yes Reason for Co-Treatment: For patient/therapist safety;To address functional/ADL transfers;Necessary to address cognition/behavior during functional activity   OT goals addressed during session: ADL's and self-care      AM-PAC OT "6 Clicks" Daily Activity     Outcome Measure   Help from another person eating meals?: A Little Help from another person taking care of personal grooming?: A Little Help from another person toileting, which includes using toliet, bedpan, or urinal?: A Lot Help from another person bathing (including washing, rinsing, drying)?: A Lot Help from another person to put on and taking off regular upper body clothing?: A Lot Help from another person to put on and taking off regular lower body clothing?: A Lot 6 Click Score: 14    End of Session Equipment Utilized During Treatment: Gait belt;Rolling walker  OT Visit Diagnosis: Unsteadiness on feet (R26.81);Muscle weakness (generalized) (M62.81);History of falling (Z91.81);Hemiplegia and hemiparesis Hemiplegia - Right/Left: Left Hemiplegia - caused by: Unspecified   Activity Tolerance Patient tolerated  treatment well   Patient Left with call bell/phone within reach;in bed;with bed alarm set   Nurse Communication Mobility status;Other (comment) (pain)        Time: 1884-1660 OT Time Calculation (min): 30 min  Charges: OT General Charges $OT Visit: 1 Visit OT Treatments $Self Care/Home Management : 8-22 mins  Barry Brunner, OT Acute Rehabilitation Services Pager [phone] Office [phone]    Chancy Milroy 09/10/2020, 11:39 AM

## 2020-09-10 NOTE — Progress Notes (Signed)
Multiple education attempts on using call light for assistance. Unsuccessful. Continues to jump out of bed and bed alarm goes off. She verbalizes that she understands how to use call light to call for assistance, however 4 times she has already jumped up without utilizing call system. Will continue to provide education.

## 2020-09-11 DIAGNOSIS — E119 Type 2 diabetes mellitus without complications: Secondary | ICD-10-CM | POA: Diagnosis not present

## 2020-09-11 DIAGNOSIS — F319 Bipolar disorder, unspecified: Secondary | ICD-10-CM | POA: Diagnosis not present

## 2020-09-11 LAB — GLUCOSE, CAPILLARY
Glucose-Capillary: 133 mg/dL — ABNORMAL HIGH (ref 70–99)
Glucose-Capillary: 106 mg/dL — ABNORMAL HIGH (ref 70–99)
Glucose-Capillary: 93 mg/dL (ref 70–99)
Glucose-Capillary: 156 mg/dL — ABNORMAL HIGH (ref 70–99)

## 2020-09-11 NOTE — Consult Note (Signed)
  Patient appears sedated, and unable to participate in capacity evaluation. Will attempt to reassess tomorrow. Dr. Robb Matar notified.

## 2020-09-11 NOTE — Progress Notes (Signed)
[facility] PROGRESS NOTE    Progress Note  Kaitlyn Gray  IRJ:[id number] DOB: 30-Jul-1975 DOA: 09/06/2020 PCP: Patient, No Pcp Per     Brief Narrative:   Kaitlyn Gray is an 45 y.o. 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 CT angio of the head and neck showed no large vessel occlusions MRI of the brain was limited no evidence for acute or subacute ischemia, chronic right PCA distribution infarct stable.  Antibiotics: None  Microbiology data: Blood culture:  Procedures: None  Assessment/Plan:   Left-sided weakness: He has been noncompliant with his medication. Neurology was consulted no events on telemetry stroke work-up negative till date. Discontinue cardiac monitoring.  Insulin-dependent diabetes mellitus A1c is 7.3, will start on long-acting insulin continue sliding scale.  Essential hypertension Blood pressure is elevated resume lisinopril.  Bipolar disorder Continue Seroquel.  History of polysubstance abuse: Continue Suboxone. UDS was positive for cocaine benzos and marijuana  Goals of care: Patient is too unstable to go home by herself, I do believe she understand the risk of doing this as she has a high probability of falling and she cannot tell me the consequences of falling and having a fracture. We will consult psych for capacity. DVT prophylaxis: lovenox Family Communication:none Status is: Inpatient  Remains inpatient appropriate because:Hemodynamically unstable   Dispo: The patient is from: Home              Anticipated d/c is to: Home              Patient currently is not medically stable to d/c.   Difficult to place patient No  Code Status:     Code Status Orders   (From admission, onward)         Start     Ordered   09/06/20 1610  Full code  Continuous        09/06/20 1610        Code Status History    Date Active Date Inactive Code Status Order ID Comments User Context   02/09/2020 1655 02/14/2020 2245 Full Code [id number]  Ollen Bowl, MD ED   01/01/2020 1752 01/04/2020 0211 Full Code [id number]  Glade Lloyd, MD ED   12/16/2019 0919 12/16/2019 1953 Full Code [id number]  Pricilla Loveless, MD ED   05/12/2018 1813 05/13/2018 1607 Full Code [id number]  Burna Cash, MD ED   12/06/2017 1650 12/07/2017 1838 Full Code [id number]  Pearson Grippe, MD ED   07/19/2017 0443 07/22/2017 1540 Full Code [id number]  Clydie Braun, MD ED   03/23/2015 1743 03/24/2015 1726 Full Code [id number]  Beau Fanny, FNP Inpatient   03/22/2015 1527 03/23/2015 1700 Full Code [id number]  Linwood Dibbles, MD ED   10/10/2014 2126 10/11/2014 0335 Full Code [id number]  Derwood Kaplan, MD ED   03/21/2013 2317 03/23/2013 1251 Full Code [id number]  Gerhard Munch, MD ED   03/01/2013 2104 03/02/2013 1316 Full Code [id number]  Schinlever, Haydee Salter ED   Advance Care Planning Activity        IV Access:    Peripheral IV   Procedures and diagnostic studies:   No results found.   Medical Consultants:    None.   Subjective:    Kaitlyn Gray relates that shoulder pain is better.  Objective:  Vitals:   09/10/20 2022 09/10/20 2346 09/11/20 0403 09/11/20 0748  BP: (!) 150/79 123/69 121/64 137/83  Pulse: 73 79 77 77  Resp: 18 18 18 16   Temp: 98.6 F (37 C) 99.1 F (37.3 C) 98.7 F (37.1 C) 99.2 F (37.3 C)  TempSrc: Oral Oral Oral Oral  SpO2: 99% 100% 99% 96%  Weight:      Height:       SpO2: 96 % O2 Flow Rate (L/min): 0 L/min   Intake/Output Summary (Last 24 hours) at 09/11/2020 1018 Last data filed at 09/11/2020 0000 Gross per 24 hour  Intake --  Output 600 ml  Net -600 ml   Filed Weights   09/06/20 1200 09/06/20 1304 09/06/20 1816  Weight: 96.5  kg 96.5 kg 91.5 kg    Exam: General exam: In no acute distress. Respiratory system: Good air movement and clear to auscultation. Cardiovascular system: S1 & S2 heard, RRR. No JVD. Gastrointestinal system: Abdomen is nondistended, soft and nontender.  Extremities: No pedal edema. Skin: No rashes, lesions or ulcers  Data Reviewed:    Labs: Basic Metabolic Panel: Recent Labs  Lab 09/06/20 1238 09/06/20 1245  NA 140 142  K 3.3* 3.6  CL 107 105  CO2 25  --   GLUCOSE 137* 139*  BUN 10 11  CREATININE 0.93 0.90  CALCIUM 8.6*  --    GFR Estimated Creatinine Clearance: 84.7 mL/min (by C-G formula based on SCr of 0.9 mg/dL). Liver Function Tests: Recent Labs  Lab 09/06/20 1238  AST 17  ALT 9  ALKPHOS 59  BILITOT 0.4  PROT 6.7  ALBUMIN 3.4*   No results for input(s): LIPASE, AMYLASE in the last 168 hours. No results for input(s): AMMONIA in the last 168 hours. Coagulation profile Recent Labs  Lab 09/06/20 1238  INR 1.1   COVID-19 Labs  No results for input(s): DDIMER, FERRITIN, LDH, CRP in the last 72 hours.  Lab Results  Component Value Date   SARSCOV2NAA NEGATIVE 09/06/2020   SARSCOV2NAA NEGATIVE 07/10/2020   SARSCOV2NAA NEGATIVE 02/09/2020   SARSCOV2NAA NEGATIVE 01/20/2020    CBC: Recent Labs  Lab 09/06/20 1238 09/06/20 1245  WBC 4.0  --   NEUTROABS 2.1  --   HGB 9.5* 10.5*  HCT 32.8* 31.0*  MCV 78.5*  --   PLT 321  --    Cardiac Enzymes: No results for input(s): CKTOTAL, CKMB, CKMBINDEX, TROPONINI in the last 168 hours. BNP (last 3 results) No results for input(s): PROBNP in the last 8760 hours. CBG: Recent Labs  Lab 09/10/20 1027 09/10/20 1118 09/10/20 1600 09/10/20 2105 09/11/20 0606  GLUCAP 141* 119* 125* 261* 133*   D-Dimer: No results for input(s): DDIMER in the last 72 hours. Hgb A1c: No results for input(s): HGBA1C in the last 72 hours. Lipid Profile: No results for input(s): CHOL, HDL, LDLCALC, TRIG, CHOLHDL, LDLDIRECT in  the last 72 hours. Thyroid function studies: No results for input(s): TSH, T4TOTAL, T3FREE, THYROIDAB in the last 72 hours.  Invalid input(s): FREET3 Anemia work up: No results for input(s): VITAMINB12, FOLATE, FERRITIN, TIBC, IRON, RETICCTPCT in the last 72 hours. Sepsis Labs: Recent Labs  Lab 09/06/20 1238  WBC 4.0   Microbiology Recent Results (from the past 240 hour(s))  Resp Panel by RT-PCR (Flu A&B, Covid) Nasopharyngeal Swab     Status: None   Collection Time: 09/06/20 12:37 PM   Specimen: Nasopharyngeal Swab; Nasopharyngeal(NP) swabs in vial transport medium  Result Value Ref Range Status  SARS Coronavirus 2 by RT PCR NEGATIVE NEGATIVE Final    Comment: (NOTE) SARS-CoV-2 target nucleic acids are NOT DETECTED.  The SARS-CoV-2 RNA is generally detectable in upper respiratory specimens during the acute phase of infection. The lowest concentration of SARS-CoV-2 viral copies this assay can detect is 138 copies/mL. A negative result does not preclude SARS-Cov-2 infection and should not be used as the sole basis for treatment or other patient management decisions. A negative result may occur with  improper specimen collection/handling, submission of specimen other than nasopharyngeal swab, presence of viral mutation(s) within the areas targeted by this assay, and inadequate number of viral copies(<138 copies/mL). A negative result must be combined with clinical observations, patient history, and epidemiological information. The expected result is Negative.  Fact Sheet for Patients:  BloggerCourse.com  Fact Sheet for Healthcare Providers:  SeriousBroker.it  This test is no t yet approved or cleared by the [place] FDA and  has been authorized for detection and/or diagnosis of SARS-CoV-2 by FDA under an Emergency Use Authorization (EUA). This EUA will remain  in effect (meaning this test can be used) for the duration  of the COVID-19 declaration under Section 564(b)(1) of the Act, 21 U.S.C.section 360bbb-3(b)(1), unless the authorization is terminated  or revoked sooner.       Influenza A by PCR NEGATIVE NEGATIVE Final   Influenza B by PCR NEGATIVE NEGATIVE Final    Comment: (NOTE) The Xpert Xpress SARS-CoV-2/FLU/RSV plus assay is intended as an aid in the diagnosis of influenza from Nasopharyngeal swab specimens and should not be used as a sole basis for treatment. Nasal washings and aspirates are unacceptable for Xpert Xpress SARS-CoV-2/FLU/RSV testing.  Fact Sheet for Patients: BloggerCourse.com  Fact Sheet for Healthcare Providers: SeriousBroker.it  This test is not yet approved or cleared by the [place] FDA and has been authorized for detection and/or diagnosis of SARS-CoV-2 by FDA under an Emergency Use Authorization (EUA). This EUA will remain in effect (meaning this test can be used) for the duration of the COVID-19 declaration under Section 564(b)(1) of the Act, 21 U.S.C. section 360bbb-3(b)(1), unless the authorization is terminated or revoked.  Performed at [facility], [address], [place], [place] [address]      Medications:   . aspirin  81 mg Oral Daily  . atorvastatin  80 mg Oral Daily  . buprenorphine-naloxone  1 tablet Sublingual TID  . enoxaparin (LOVENOX) injection  40 mg Subcutaneous Q24H  . ferrous sulfate  325 mg Oral Daily  . gabapentin  400 mg Oral BID  . insulin aspart  0-15 Units Subcutaneous TID WC  . insulin aspart  0-5 Units Subcutaneous QHS  . insulin aspart  3 Units Subcutaneous TID WC  . insulin detemir  5 Units Subcutaneous BID  . lisinopril  10 mg Oral Daily  . QUEtiapine  200 mg Oral QHS   Continuous Infusions:    LOS: 5 days   Marinda Elk  [facility]  09/11/2020, 10:18 AM

## 2020-09-12 DIAGNOSIS — I639 Cerebral infarction, unspecified: Secondary | ICD-10-CM | POA: Diagnosis not present

## 2020-09-12 DIAGNOSIS — F319 Bipolar disorder, unspecified: Secondary | ICD-10-CM | POA: Diagnosis not present

## 2020-09-12 DIAGNOSIS — I10 Essential (primary) hypertension: Secondary | ICD-10-CM | POA: Diagnosis not present

## 2020-09-12 LAB — GLUCOSE, CAPILLARY
Glucose-Capillary: 142 mg/dL — ABNORMAL HIGH (ref 70–99)
Glucose-Capillary: 114 mg/dL — ABNORMAL HIGH (ref 70–99)
Glucose-Capillary: 182 mg/dL — ABNORMAL HIGH (ref 70–99)
Glucose-Capillary: 98 mg/dL (ref 70–99)
Glucose-Capillary: 146 mg/dL — ABNORMAL HIGH (ref 70–99)

## 2020-09-12 LAB — SARS CORONAVIRUS 2 (TAT 6-24 HRS): SARS Coronavirus 2: NEGATIVE

## 2020-09-12 MED ORDER — HYDROXYZINE HCL 25 MG PO TABS: 12.5000 mg | ORAL_TABLET | Freq: Four times a day (QID) | ORAL | Status: DC | PRN

## 2020-09-12 MED ORDER — METFORMIN HCL 500 MG PO TABS
500.0000 mg | ORAL_TABLET | Freq: Every day | ORAL | Status: DC
  Administered 2020-09-13: 500 mg via ORAL
  Filled 2020-09-12 (×2): qty 1

## 2020-09-12 NOTE — TOC Progression Note (Signed)
Transition of Care [facility]) - Progression Note    Patient Details  Name: Kaitlyn Gray MRN: [id number] Date of Birth: 1975/06/23  Transition of Care [facility]) CM/SW Contact  Baldemar Lenis, [place] Phone Number: 09/12/2020, 12:00 PM  Clinical Narrative:   CSW noting per chart review that patient is deemed to not have capacity, will not be able to discharge home alone. Patient has no bed offers for SNF at this time. CSW asked [place] to review for SNF placement. CSW faxed out referral to the surrounding areas to try to locate a bed for patient. CSW to continue to follow.    Expected Discharge Plan: Skilled Nursing Facility Barriers to Discharge: Inadequate or no insurance,Active Substance Use - Placement,SNF Pending bed offer  Expected Discharge Plan and Services Expected Discharge Plan: Skilled Nursing Facility     Post Acute Care Choice: Skilled Nursing Facility Living arrangements for the past 2 months: Single Family Home                                       Social Determinants of Health (SDOH) Interventions    Readmission Risk Interventions No flowsheet data found.
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
## 2020-09-12 NOTE — Plan of Care (Signed)
  Problem: Education: Goal: Knowledge of General Education information will improve Description: Including pain rating scale, medication(s)/side effects and non-pharmacologic comfort measures Outcome: Progressing   Problem: Health Behavior/Discharge Planning: Goal: Ability to manage health-related needs will improve Outcome: Progressing   Problem: Safety: Goal: Ability to remain free from injury will improve Outcome: Progressing   

## 2020-09-12 NOTE — Consult Note (Signed)
  Mental Capacity Assessment: I have evaluated the following areas to assess the Kaitlyn Gray's mental capacity regarding medical decision-making ability which pertains to competency to accept or refuse medical treatment.   The specific treatment or service in question is: discharging home.  Communication: The patient was unable to clearly state preferred treatment options for her medical care at this time.  Understanding: The patient was unable to recall information, link causal relationships, and process general probabilities regarding life situations and medical treatment scenarios.The patient did present with impairments in memory, attention span, or intelligence.   Appreciation: The patient was unable to identify and describe her various illnesses and treatment options with potential outcomes. The patient did present with concerns such as denial.      Rationalization: The patient was unable to weigh risks and benefits and come to a conclusion congruent with patient's perceived goals. Concerns regarding this category are: depression, acute/chronic encephalopathy, electrolyte imbalances, CVA.   Conclusion: At this time, there is sufficient evidence to warrant removal of the patient's rights for medical decision-making. She can not clearly determine mental capacity for decision-making due to presence of underlying brain injury.  For these reasons writer feels that patient has doesnt have capacity at this point in time to participate in treatment.   Psych consult was placed for mental capacity, as patient insight and judgement continue to fluctuate throughout this hospital admission. In terms of decision making capacity, patient lacks insight, and is unable to link causal relationships. This is evident by her inability to weigh her current physical limitations, environmental limitation and need for physical therapy services. Therefore she lacks capacity as she is unable to appreciate the effects  of treatment and her need for ongoing services.

## 2020-09-13 DIAGNOSIS — I10 Essential (primary) hypertension: Secondary | ICD-10-CM | POA: Diagnosis not present

## 2020-09-13 LAB — COMPREHENSIVE METABOLIC PANEL
ALT: 17 U/L (ref 0–44)
AST: 21 U/L (ref 15–41)
Albumin: 2.6 g/dL — ABNORMAL LOW (ref 3.5–5.0)
Alkaline Phosphatase: 52 U/L (ref 38–126)
Anion gap: 4 — ABNORMAL LOW (ref 5–15)
BUN: 11 mg/dL (ref 6–20)
CO2: 27 mmol/L (ref 22–32)
Calcium: 8.3 mg/dL — ABNORMAL LOW (ref 8.9–10.3)
Chloride: 106 mmol/L (ref 98–111)
Creatinine, Ser: 0.76 mg/dL (ref 0.44–1.00)
GFR, Estimated: >60 mL/min (ref >60)
Glucose, Bld: 120 mg/dL — ABNORMAL HIGH (ref 70–99)
Potassium: 3.8 mmol/L (ref 3.5–5.1)
Sodium: 137 mmol/L (ref 135–145)
Total Bilirubin: 0.2 mg/dL — ABNORMAL LOW (ref 0.3–1.2)
Total Protein: 5.9 g/dL — ABNORMAL LOW (ref 6.5–8.1)

## 2020-09-13 LAB — CBC WITH DIFFERENTIAL/PLATELET
Abs Immature Granulocytes: 0.01 10*3/uL (ref 0.00–0.07)
Basophils Absolute: 0 10*3/uL (ref 0.0–0.1)
Basophils Relative: 1 %
Eosinophils Absolute: 0.2 10*3/uL (ref 0.0–0.5)
Eosinophils Relative: 4 %
HCT: 28.7 % — ABNORMAL LOW (ref 36.0–46.0)
Hemoglobin: 8.4 g/dL — ABNORMAL LOW (ref 12.0–15.0)
Immature Granulocytes: 0 %
Lymphocytes Relative: 27 %
Lymphs Abs: 1.2 10*3/uL (ref 0.7–4.0)
MCH: 23.5 pg — ABNORMAL LOW (ref 26.0–34.0)
MCHC: 29.3 g/dL — ABNORMAL LOW (ref 30.0–36.0)
MCV: 80.4 fL (ref 80.0–100.0)
Monocytes Absolute: 0.4 10*3/uL (ref 0.1–1.0)
Monocytes Relative: 9 %
Neutro Abs: 2.6 10*3/uL (ref 1.7–7.7)
Neutrophils Relative %: 59 %
Platelets: 268 10*3/uL (ref 150–400)
RBC: 3.57 MIL/uL — ABNORMAL LOW (ref 3.87–5.11)
RDW: 18.7 % — ABNORMAL HIGH (ref 11.5–15.5)
WBC: 4.3 10*3/uL (ref 4.0–10.5)
nRBC: 0 % (ref 0.0–0.2)

## 2020-09-13 LAB — GLUCOSE, CAPILLARY
Glucose-Capillary: 126 mg/dL — ABNORMAL HIGH (ref 70–99)
Glucose-Capillary: 168 mg/dL — ABNORMAL HIGH (ref 70–99)
Glucose-Capillary: 121 mg/dL — ABNORMAL HIGH (ref 70–99)
Glucose-Capillary: 148 mg/dL — ABNORMAL HIGH (ref 70–99)

## 2020-09-13 MED ORDER — GABAPENTIN 400 MG PO CAPS: 400.0000 mg | ORAL_CAPSULE | Freq: Two times a day (BID) | ORAL | 0 refills | Status: DC

## 2020-09-13 MED ORDER — HALOPERIDOL LACTATE 5 MG/ML IJ SOLN
2.0000 mg | Freq: Four times a day (QID) | INTRAMUSCULAR | Status: DC | PRN
  Filled 2020-09-13: qty 1

## 2020-09-13 MED ORDER — ZIPRASIDONE MESYLATE 20 MG IM SOLR: 20.0000 mg | Freq: Once | INTRAMUSCULAR | Status: DC

## 2020-09-13 MED ORDER — BENZTROPINE MESYLATE 1 MG/ML IJ SOLN
0.5000 mg | Freq: Two times a day (BID) | INTRAMUSCULAR | Status: DC
  Filled 2020-09-13 (×2): qty 0.5

## 2020-09-13 MED ORDER — HYDROXYZINE HCL 25 MG PO TABS: 12.5000 mg | ORAL_TABLET | Freq: Four times a day (QID) | ORAL | 0 refills | Status: DC | PRN

## 2020-09-13 MED ORDER — BENZTROPINE MESYLATE 0.5 MG PO TABS
0.5000 mg | ORAL_TABLET | Freq: Two times a day (BID) | ORAL | Status: DC
  Filled 2020-09-13 (×2): qty 1

## 2020-09-13 MED ORDER — SUBOXONE 8-2 MG SL FILM: 1.0000 | ORAL_FILM | Freq: Three times a day (TID) | SUBLINGUAL | 0 refills | Status: DC

## 2020-09-13 MED ORDER — HALOPERIDOL LACTATE 5 MG/ML IJ SOLN
5.0000 mg | Freq: Two times a day (BID) | INTRAMUSCULAR | Status: DC
  Administered 2020-09-13: 5 mg via INTRAVENOUS
  Filled 2020-09-13: qty 1

## 2020-09-13 MED ORDER — ZIPRASIDONE MESYLATE 20 MG IM SOLR
20.0000 mg | Freq: Four times a day (QID) | INTRAMUSCULAR | Status: DC | PRN
  Filled 2020-09-13: qty 20

## 2020-09-13 MED ORDER — ZIPRASIDONE MESYLATE 20 MG IM SOLR
20.0000 mg | Freq: Once | INTRAMUSCULAR | Status: AC
  Administered 2020-09-13: 20 mg via INTRAMUSCULAR
  Filled 2020-09-13: qty 20

## 2020-09-13 NOTE — Progress Notes (Signed)
PT Cancellation Note  Patient Details Name: Kaitlyn Gray MRN: [id number] DOB: [birth year]   Cancelled Treatment:    Reason Eval/Treat Not Completed: Patient declined, no reason specified Patient refused therapy after 3 attempts of encouraging OOB mobility. Patient drifting in/out of sleep and only responds to name. Unable to follow commands when asked to visually track finger and touch finger. Patient refused final time when asked to get OOB. PT will re-attempt as time allows.   Teaira Croft A. Dan Humphreys PT, DPT Acute Rehabilitation Services Pager [phone] Office [phone]    Viviann Spare 09/13/2020, 11:17 AM

## 2020-09-13 NOTE — Progress Notes (Addendum)
Name: Kaitlyn Gray DOB: 05-Jul-1975  Please be advised that the above-named patient will require a short-term nursing home stay -- anticipated 30 days or less for rehabilitation and strengthening. The plan is for return home.  This patient is under my care and supervision  Pleas Koch, MD [facility] 10:40 AM

## 2020-09-13 NOTE — Progress Notes (Addendum)
At 1600 patient began to become restless and refused her medication stating" you guy are trying to give me stuff and I'm not taking the shit." This nurse tried to educate patient on each medication as ordered.   At 1736 patient yelling and screaming and using inappropriate language to staff as she ambulated into hallway and began to try and leave. This nurse and another nurse was able to talk patient back into her room. The doctor came in and tried to talk with patient but she began to yell and scream and not corporate again with staff. Security had to come to bed side as patient continued to yell and scream at staff. Another nurse tried to talk with patient and comfort and active listening given. PRN Haldol and Geodon given as ordered. Telebox placed  on patient to monitor.   Patient now calm in bed asleep and not a danger to self or others.

## 2020-09-13 NOTE — Progress Notes (Signed)
Responded to reports patient agitated wanting to leave  IVC paperwork filled out  Patient trying to leave unit security called  Given Haldol  D/w Dr. Lucianne Muss, Psychiatry recs Haldol 5 bid, Cogentin additionally [see orderd] Geodon doesn't work on her  Psych to follow in am Safety sitter requested  >45 min additional direct care time  Pleas Koch, MD [facility] 5:46 PM

## 2020-09-13 NOTE — TOC Progression Note (Signed)
Transition of Care [facility]) - Progression Note    Patient Details  Name: Kaitlyn Gray MRN: [id number] Date of Birth: [birth year]  [facility] CM/SW Contact  Kermit Balo, RN Phone Number: 09/13/2020, 11:56 AM  Clinical Narrative:    CM spoke to the patient and she is insisting on d/cing home. CM informed her that psychiatry felt she was not able to make her own medical decisions currently and she asked to see them again. CM asked about next of kin and she says her aunt. CM called Ms Amie Critchley and she states she is homebound and she it the great-aunt. She provided me with the number for Tishina Lown (aunt): [phone]. CM has left a voicemail for Diane.  TOC following.   Expected Discharge Plan: Skilled Nursing Facility Barriers to Discharge: Inadequate or no insurance,Active Substance Use - Placement,SNF Pending bed offer  Expected Discharge Plan and Services Expected Discharge Plan: Skilled Nursing Facility     Post Acute Care Choice: Skilled Nursing Facility Living arrangements for the past 2 months: Single Family Home Expected Discharge Date: 09/13/20                                     Social Determinants of Health (SDOH) Interventions    Readmission Risk Interventions No flowsheet data found.

## 2020-09-13 NOTE — Discharge Summary (Addendum)
Physician Discharge Summary  Kaitlyn Gray:[id number] DOB: February 02, 1976 DOA: 09/06/2020  PCP: Patient, No Pcp Per (Inactive)  Admit date: 09/06/2020 Discharge date: 09/13/2020  Time spent: 22 minutes  Recommendations for Outpatient Follow-up:  1. Patient represents extremely high risk for readmission given very poor social support in the outpatient setting--we will enlist DSS to look into her social situation and prevent readmissions with a safety net although this is still unlikely to completely work 2. Needs to conitnue OP therapy at [facility]" for Therapy and suboxone 3. We will attempt to set up with PCP 4. Patient will need Chem-12, CBC when able 5. Recommend outpatient psychiatry input  Discharge Diagnoses:  MAIN problem for hospitalization   Weakness on the left side stroke ruled out on discharge  Please see below for itemized issues addressed in [place]- refer to other progress notes for clarity if needed  Discharge Condition: Very guarded  Diet recommendation: What ever patient wishes to eat-she will not comply with diet  Filed Weights   09/06/20 1200 09/06/20 1304 09/06/20 1816  Weight: 96.5 kg 96.5 kg 91.5 kg    History of present illness:   black?  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 Prior right thigh abscess 2019 status post treatment with multiple attempts at discharge [place] with new onset left-sided weakness Work-up with CT head and MRI brain showed no ischemia She has been deemed unsafe to discharge home on oral   psychiatry saw her on 3/30 and deemed her as not having capacity to make medical decisions for herself--social worker aware and will discuss with supervisory committee in charge of planning for making her a ward of the state if there is no responsible party to take  care of her reliably  Hospital Course:   Left-sided weakness on admission Patient noncompliant on medication Stroke work-up negative-stroke team signed off Polysubstance abuse cocaine benzo marijuana History of Suboxone use Tells me she lives with a boyfriend who recently was incarcerated  [facility] Bipolar affective disorder Seroquel resumed this admission Psychiatry saw the patient and deemed that the patient DOES NOT have capacity for medical decision making Social worker myself and nursing have discussed with the patient that it is in the patient's best interest to go to skilled facility--she declined and became belligerent Patient was IVC this admission 4/1, I discussed with psychiatry dr. Nestor Ramp were called--she was medicated with Haldol and Cogentin psychiatry saw patient IVC reversed by psychiatry on 4/1 Homeless? Prior admissions - patient has been homeless No family is present-patient tells me that she has an aunt in town that is estranged from her Greatly appreciate efforts from social work DM TY 2 A1c 7.3 CBGs well-controlled during hospital stay-stop insulin Resume Metformin at lower dose 500 XL given her chronic noncompliance-I doubt she will comply but we will discharge her on Metformin  BMI 35 Outpatient discussion of this when she is stabilized mentally   Procedures: Multiple Consultations:  Psychiatry  Neurology  Discharge Exam: Vitals:   09/13/20 0319 09/13/20 0849  BP: 138/72 123/70  Pulse: 75 71  Resp: 17 16  Temp: 98.7 F (37.1 C) 98.9 F (37.2 C)  SpO2: 98% 100%    Subj on day of d/c   Awake pleasant in nad no focal deficit cta b no adde dsoudn no rales no rhonchi Smiling happy  General Exam on discharge  Obese alert female no distress EOMI NCAT thick neck Mallampati 4 CTA B S1-S2 no murmur no rub no gallop Abdomen soft No lower extremity edema Psych-happy smiling   Discharge Instructions   Discharge  Instructions    Diet - low sodium heart healthy   Complete by: As directed    Increase activity slowly   Complete by: As directed      Allergies as of 09/14/2020      Reactions   Hydrocodone Itching   Latex Rash      Medication List    STOP taking these medications   insulin glargine 100 UNIT/ML Solostar Pen Commonly known as: Lantus SoloStar   insulin lispro 100 UNIT/ML KiwkPen Commonly known as: HumaLOG KwikPen     TAKE these medications   aspirin 81 MG chewable tablet Chew 1 tablet (81 mg total) by mouth daily.   atorvastatin 80 MG tablet Commonly known as: LIPITOR Take 1 tablet (80 mg total) by mouth daily.   blood glucose meter kit and supplies Relion Prime or Dispense other brand based on patient and insurance preference. Use up to four times daily as directed. (FOR ICD-9 250.00, 250.01).   blood glucose meter kit and supplies Kit Dispense based on patient and insurance preference. Use up to four times daily as directed. (FOR ICD-9 250.00, 250.01). Please give 3 months supply of lancets and test strips to allow her to check QID.   ferrous sulfate 325 (65 FE) MG tablet Take 1 tablet (325 mg total) by mouth daily.   gabapentin 400 MG capsule Commonly known as: NEURONTIN Take 1 capsule (400 mg total) by mouth 2 (two) times daily.   hydrOXYzine 25 MG tablet Commonly known as: ATARAX/VISTARIL Take 0.5 tablets (12.5 mg total) by mouth every 6 (six) hours as needed for anxiety or itching. What changed:   how much to take  reasons to take this   Insulin Pen Needle 31G X 5 MM Misc Use as directed   lisinopril 10 MG tablet Commonly known as: ZESTRIL Take 1 tablet (10 mg total) by mouth daily.   metFORMIN 1000 MG tablet Commonly known as: GLUCOPHAGE Take 1 tablet (1,000 mg total) by mouth 2 (two) times daily with a meal.   QUEtiapine 200 MG tablet Commonly known as: SEROQUEL Take 200 mg by mouth at bedtime.   Suboxone 8-2 MG Film Generic drug:  Buprenorphine HCl-Naloxone HCl Place 1 Film under the tongue in the morning, at noon, and at bedtime.      Allergies  Allergen Reactions  . Hydrocodone Itching  . Latex Rash    Follow-up Information    [facility]. Schedule an appointment as soon as possible for a visit in 4 week(s).   Specialty: Neurology Contact information: [address] [place] [phone]               The results of significant diagnostics from this hospitalization (including imaging, microbiology, ancillary and laboratory) are listed below for reference.    Significant Diagnostic Studies: CT ANGIO HEAD W OR WO CONTRAST  Result Date: 09/06/2020 CLINICAL DATA:  Left-sided weakness EXAM: CT ANGIOGRAPHY HEAD AND NECK CT PERFUSION BRAIN TECHNIQUE: Multidetector CT imaging of the head and neck was performed using the standard protocol during bolus administration of intravenous contrast. Multiplanar CT image reconstructions and MIPs were obtained to evaluate the vascular anatomy. Carotid stenosis measurements (when applicable) are obtained utilizing NASCET criteria, using the distal internal carotid diameter as the denominator. Multiphase  CT imaging of the brain was performed following IV bolus contrast injection. Subsequent parametric perfusion maps were calculated using RAPID software. CONTRAST:  162m OMNIPAQUE IOHEXOL 350 MG/ML SOLN COMPARISON:  None. FINDINGS: CTA NECK Aortic arch: Great vessel origins are patent. Right carotid system: Patent. Circumferential plaque along the cervical ICA approximately 1.5 cm from the origin causes nearly 50% stenosis. Partially retropharyngeal course. Left carotid system: Patent. No stenosis at the ICA origin. Partially retropharyngeal course. Vertebral arteries: Patent. Right vertebral artery slightly dominant. No stenosis. Skeleton: Mild degenerative changes of the cervical spine. Other neck: Negative. Upper chest: No  apical lung mass. Review of the MIP images confirms the above findings CTA HEAD Anterior circulation: Intracranial internal carotid arteries are patent with mild stenosis on the left. Anterior cerebral arteries are patent. Middle cerebral arteries are patent. Mild atherosclerotic irregularity of distal ACA and MCA branches. Posterior circulation: Intracranial vertebral arteries, basilar artery, and posterior cerebral arteries are patent. Major cerebellar artery origins are patent. Atherosclerotic irregularity of the basilar and posterior cerebral arteries. Moderate to marked focal stenosis of the right P2 PCA. Mild stenoses on the left. Venous sinuses: Patent as allowed by contrast bolus timing. Review of the MIP images confirms the above findings CT Brain Perfusion Findings: CBF (<30%) Volume: 024mPerfusion (Tmax>6.0s) volume: 29m32mismatch Volume: 29mL81mfarction Location: None. IMPRESSION: No large vessel occlusion. Circumferential plaque along the proximal right cervical ICA causes nearly 50% stenosis. Multifocal mild intracranial atherosclerosis. Moderate to marked focal stenosis of the right P2 PCA. Initial results were communicated to Dr. LindCheral Marker1:04 pm on 09/06/2020 by text page via the [facility]aging system. Electronically Signed   By: PranMacy Mis.   On: 09/06/2020 13:10   CT ANGIO NECK W OR WO CONTRAST  Result Date: 09/06/2020 CLINICAL DATA:  Left-sided weakness EXAM: CT ANGIOGRAPHY HEAD AND NECK CT PERFUSION BRAIN TECHNIQUE: Multidetector CT imaging of the head and neck was performed using the standard protocol during bolus administration of intravenous contrast. Multiplanar CT image reconstructions and MIPs were obtained to evaluate the vascular anatomy. Carotid stenosis measurements (when applicable) are obtained utilizing NASCET criteria, using the distal internal carotid diameter as the denominator. Multiphase CT imaging of the brain was performed following IV bolus contrast injection.  Subsequent parametric perfusion maps were calculated using RAPID software. CONTRAST:  1029mL75mIPAQUE IOHEXOL 350 MG/ML SOLN COMPARISON:  None. FINDINGS: CTA NECK Aortic arch: Great vessel origins are patent. Right carotid system: Patent. Circumferential plaque along the cervical ICA approximately 1.5 cm from the origin causes nearly 50% stenosis. Partially retropharyngeal course. Left carotid system: Patent. No stenosis at the ICA origin. Partially retropharyngeal course. Vertebral arteries: Patent. Right vertebral artery slightly dominant. No stenosis. Skeleton: Mild degenerative changes of the cervical spine. Other neck: Negative. Upper chest: No apical lung mass. Review of the MIP images confirms the above findings CTA HEAD Anterior circulation: Intracranial internal carotid arteries are patent with mild stenosis on the left. Anterior cerebral arteries are patent. Middle cerebral arteries are patent. Mild atherosclerotic irregularity of distal ACA and MCA branches. Posterior circulation: Intracranial vertebral arteries, basilar artery, and posterior cerebral arteries are patent. Major cerebellar artery origins are patent. Atherosclerotic irregularity of the basilar and posterior cerebral arteries. Moderate to marked focal stenosis of the right P2 PCA. Mild stenoses on the left. Venous sinuses: Patent as allowed by contrast bolus timing. Review of the MIP images confirms the above findings CT Brain Perfusion Findings: CBF (<30%) Volume: 29mL P35musion (Tmax>6.0s) volume: 29mL Mi62mtch Volume:  774m Infarction Location: None. IMPRESSION: No large vessel occlusion. Circumferential plaque along the proximal right cervical ICA causes nearly 50% stenosis. Multifocal mild intracranial atherosclerosis. Moderate to marked focal stenosis of the right P2 PCA. Initial results were communicated to Dr. LCheral Markerat 1:04 pm on 09/06/2020 by text page via the [facility]messaging system. Electronically Signed   By: PMacy MisM.D.    On: 09/06/2020 13:10   MR BRAIN WO CONTRAST  Result Date: 09/06/2020 CLINICAL DATA:  Initial evaluation for possible stroke. EXAM: MRI HEAD WITHOUT CONTRAST TECHNIQUE: Multiplanar, multiecho pulse sequences of the brain and surrounding structures were obtained without intravenous contrast. COMPARISON:  Prior CTs from earlier the same day. FINDINGS: Brain: Examination technically limited as the patient was unable to tolerate the full length of the study. Axial DWI sequence only was performed. Diffusion-weighted sequence demonstrates no evidence for acute or subacute ischemia. Chronic right PCA distribution infarct noted, stable. Gray-white matter differentiation otherwise grossly maintained. No visible mass lesion, mass effect or midline shift. No hydrocephalus or extra-axial fluid collection. Vascular: Not assessed on this limited exam. Skull and upper cervical spine: Not assessed on this limited exam. Sinuses/Orbits: Not assessed on this limited exam. Other: Negative. IMPRESSION: 1. Technically limited exam due to the patient's inability to tolerate the full length of the study. Axial DWI sequence only was performed. 2. No evidence for acute or subacute ischemia. 3. Chronic right PCA distribution infarct, stable. Electronically Signed   By: BJeannine BogaM.D.   On: 09/06/2020 22:24   CT CEREBRAL PERFUSION W CONTRAST  Result Date: 09/06/2020 CLINICAL DATA:  Left-sided weakness EXAM: CT ANGIOGRAPHY HEAD AND NECK CT PERFUSION BRAIN TECHNIQUE: Multidetector CT imaging of the head and neck was performed using the standard protocol during bolus administration of intravenous contrast. Multiplanar CT image reconstructions and MIPs were obtained to evaluate the vascular anatomy. Carotid stenosis measurements (when applicable) are obtained utilizing NASCET criteria, using the distal internal carotid diameter as the denominator. Multiphase CT imaging of the brain was performed following IV bolus contrast  injection. Subsequent parametric perfusion maps were calculated using RAPID software. CONTRAST:  1064mOMNIPAQUE IOHEXOL 350 MG/ML SOLN COMPARISON:  None. FINDINGS: CTA NECK Aortic arch: Great vessel origins are patent. Right carotid system: Patent. Circumferential plaque along the cervical ICA approximately 1.5 cm from the origin causes nearly 50% stenosis. Partially retropharyngeal course. Left carotid system: Patent. No stenosis at the ICA origin. Partially retropharyngeal course. Vertebral arteries: Patent. Right vertebral artery slightly dominant. No stenosis. Skeleton: Mild degenerative changes of the cervical spine. Other neck: Negative. Upper chest: No apical lung mass. Review of the MIP images confirms the above findings CTA HEAD Anterior circulation: Intracranial internal carotid arteries are patent with mild stenosis on the left. Anterior cerebral arteries are patent. Middle cerebral arteries are patent. Mild atherosclerotic irregularity of distal ACA and MCA branches. Posterior circulation: Intracranial vertebral arteries, basilar artery, and posterior cerebral arteries are patent. Major cerebellar artery origins are patent. Atherosclerotic irregularity of the basilar and posterior cerebral arteries. Moderate to marked focal stenosis of the right P2 PCA. Mild stenoses on the left. Venous sinuses: Patent as allowed by contrast bolus timing. Review of the MIP images confirms the above findings CT Brain Perfusion Findings: CBF (<30%) Volume: 74m75merfusion (Tmax>6.0s) volume: 74mL51msmatch Volume: 74mL 82marction Location: None. IMPRESSION: No large vessel occlusion. Circumferential plaque along the proximal right cervical ICA causes nearly 50% stenosis. Multifocal mild intracranial atherosclerosis. Moderate to marked focal stenosis of the right P2 PCA. Initial  results were communicated to Dr. Cheral Marker at 1:04 pm on 09/06/2020 by text page via the [facility] messaging system. Electronically Signed   By: Macy Mis M.D.   On: 09/06/2020 13:10   ECHOCARDIOGRAM COMPLETE  Result Date: 09/07/2020    ECHOCARDIOGRAM REPORT   Patient Name:   JAMELAH SITZER Date of Exam: 09/07/2020 Medical Rec #:  [id number]         Height:       63.0 in Accession #:    [id number]        Weight:       201.7 lb Date of Birth:  Jun 05, 1976         BSA:          1.941 m Patient Age:    45 years          BP:           144/99 mmHg Patient Gender: F                 HR:           64 bpm. Exam Location:  Inpatient Procedure: 2D Echo, Cardiac Doppler and Color Doppler Indications:    TIA G45.9  History:        Patient has prior history of Echocardiogram examinations, most                 recent 01/02/2020. Stroke; Risk Factors:Hypertension and Current                 Smoker.  Sonographer:    Vickie Epley RDCS Referring Phys: [id number] [facility]  Sonographer Comments: Image acquisition challenging due to respiratory motion. IMPRESSIONS  1. Left ventricular ejection fraction, by estimation, is 65 to 70%. The left ventricle has normal function. The left ventricle has no regional wall motion abnormalities. There is moderate concentric left ventricular hypertrophy. Left ventricular diastolic parameters are consistent with Grade II diastolic dysfunction (pseudonormalization). Elevated left atrial pressure.  2. Right ventricular systolic function is normal. The right ventricular size is normal.  3. Left atrial size was mildly dilated.  4. The mitral valve is normal in structure. No evidence of mitral valve regurgitation. No evidence of mitral stenosis.  5. The aortic valve is normal in structure. Aortic valve regurgitation is trivial. No aortic stenosis is present.  6. The inferior vena cava is normal in size with greater than 50% respiratory variability, suggesting right atrial pressure of 3 mmHg. Comparison(s): No significant change from prior study. Prior images reviewed side by side. FINDINGS  Left Ventricle: Left ventricular ejection fraction, by  estimation, is 65 to 70%. The left ventricle has normal function. The left ventricle has no regional wall motion abnormalities. The left ventricular internal cavity size was normal in size. There is  moderate concentric left ventricular hypertrophy. Left ventricular diastolic parameters are consistent with Grade II diastolic dysfunction (pseudonormalization). Elevated left atrial pressure. Right Ventricle: The right ventricular size is normal. No increase in right ventricular wall thickness. Right ventricular systolic function is normal. Left Atrium: Left atrial size was mildly dilated. Right Atrium: Right atrial size was normal in size. Pericardium: There is no evidence of pericardial effusion. Mitral Valve: The mitral valve is normal in structure. No evidence of mitral valve regurgitation. No evidence of mitral valve stenosis. Tricuspid Valve: The tricuspid valve is normal in structure. Tricuspid valve regurgitation is not demonstrated. No evidence of tricuspid stenosis. Aortic Valve: The aortic valve is normal in structure. Aortic valve regurgitation  is trivial. No aortic stenosis is present. Pulmonic Valve: The pulmonic valve was normal in structure. Pulmonic valve regurgitation is not visualized. No evidence of pulmonic stenosis. Aorta: The aortic root is normal in size and structure. Venous: The inferior vena cava is normal in size with greater than 50% respiratory variability, suggesting right atrial pressure of 3 mmHg. IAS/Shunts: No atrial level shunt detected by color flow Doppler.  LEFT VENTRICLE PLAX 2D LVIDd:         4.60 cm      Diastology LVIDs:         3.30 cm      LV e' medial:    4.05 cm/s LV PW:         1.60 cm      LV E/e' medial:  18.2 LV IVS:        1.30 cm      LV e' lateral:   5.11 cm/s LVOT diam:     2.20 cm      LV E/e' lateral: 14.4 LV SV:         67 LV SV Index:   34 LVOT Area:     3.80 cm  LV Volumes (MOD) LV vol d, MOD A2C: 104.0 ml LV vol d, MOD A4C: 133.0 ml LV vol s, MOD A2C: 45.0  ml LV vol s, MOD A4C: 67.0 ml LV SV MOD A2C:     59.0 ml LV SV MOD A4C:     133.0 ml LV SV MOD BP:      62.9 ml RIGHT VENTRICLE RV S prime:     11.10 cm/s TAPSE (M-mode): 2.0 cm LEFT ATRIUM           Index       RIGHT ATRIUM           Index LA diam:      3.80 cm 1.96 cm/m  RA Area:     12.30 cm LA Vol (A2C): 24.5 ml 12.62 ml/m RA Volume:   24.00 ml  12.37 ml/m LA Vol (A4C): 38.2 ml 19.68 ml/m  AORTIC VALVE LVOT Vmax:   93.30 cm/s LVOT Vmean:  61.400 cm/s LVOT VTI:    0.176 m  AORTA Ao Root diam: 3.30 cm Ao Asc diam:  3.50 cm MITRAL VALVE MV Area (PHT): 2.91 cm    SHUNTS MV Decel Time: 261 msec    Systemic VTI:  0.18 m MV E velocity: 73.70 cm/s  Systemic Diam: 2.20 cm MV A velocity: 48.00 cm/s MV E/A ratio:  1.54 Mihai Croitoru MD Electronically signed by Sanda Klein MD Signature Date/Time: 09/07/2020/4:30:56 PM    Final    CT HEAD CODE STROKE WO CONTRAST  Result Date: 09/06/2020 CLINICAL DATA:  Code stroke. Acute neuro deficit. Slurred speech and left arm weakness and numbness EXAM: CT HEAD WITHOUT CONTRAST TECHNIQUE: Contiguous axial images were obtained from the base of the skull through the vertex without intravenous contrast. COMPARISON:  CT head 07/10/2020 FINDINGS: Brain: Patchy white matter hypodensity bilaterally unchanged. Chronic infarct right occipital pole unchanged. Ventricle size normal. Negative for acute infarct, hemorrhage, mass Vascular: Negative for hyperdense vessel Skull: Negative Sinuses/Orbits: Mucosal edema paranasal sinuses.  Negative orbit Other: None ASPECTS (Douglas Stroke Program Early CT Score) - Ganglionic level infarction (caudate, lentiform nuclei, internal capsule, insula, M1-M3 cortex): 7 - Supraganglionic infarction (M4-M6 cortex): 3 Total score (0-10 with 10 being normal): 10 IMPRESSION: 1. No acute abnormality. Chronic ischemic changes stable from the prior study. 2. ASPECTS is 10 3.  Code stroke imaging results were communicated on 09/06/2020 at 12:47 pm to provider  [place] via text page Electronically Signed   By: Franchot Gallo M.D.   On: 09/06/2020 12:47    Microbiology: Recent Results (from the past 240 hour(s))  Resp Panel by RT-PCR (Flu A&B, Covid) Nasopharyngeal Swab     Status: None   Collection Time: 09/06/20 12:37 PM   Specimen: Nasopharyngeal Swab; Nasopharyngeal(NP) swabs in vial transport medium  Result Value Ref Range Status   SARS Coronavirus 2 by RT PCR NEGATIVE NEGATIVE Final    Comment: (NOTE) SARS-CoV-2 target nucleic acids are NOT DETECTED.  The SARS-CoV-2 RNA is generally detectable in upper respiratory specimens during the acute phase of infection. The lowest concentration of SARS-CoV-2 viral copies this assay can detect is 138 copies/mL. A negative result does not preclude SARS-Cov-2 infection and should not be used as the sole basis for treatment or other patient management decisions. A negative result may occur with  improper specimen collection/handling, submission of specimen other than nasopharyngeal swab, presence of viral mutation(s) within the areas targeted by this assay, and inadequate number of viral copies(<138 copies/mL). A negative result must be combined with clinical observations, patient history, and epidemiological information. The expected result is Negative.  Fact Sheet for Patients:  EntrepreneurPulse.com.au  Fact Sheet for Healthcare Providers:  IncredibleEmployment.be  This test is no t yet approved or cleared by the [place] FDA and  has been authorized for detection and/or diagnosis of SARS-CoV-2 by FDA under an Emergency Use Authorization (EUA). This EUA will remain  in effect (meaning this test can be used) for the duration of the COVID-19 declaration under Section 564(b)(1) of the Act, 21 U.S.C.section 360bbb-3(b)(1), unless the authorization is terminated  or revoked sooner.       Influenza A by PCR NEGATIVE NEGATIVE Final   Influenza B by PCR  NEGATIVE NEGATIVE Final    Comment: (NOTE) The Xpert Xpress SARS-CoV-2/FLU/RSV plus assay is intended as an aid in the diagnosis of influenza from Nasopharyngeal swab specimens and should not be used as a sole basis for treatment. Nasal washings and aspirates are unacceptable for Xpert Xpress SARS-CoV-2/FLU/RSV testing.  Fact Sheet for Patients: EntrepreneurPulse.com.au  Fact Sheet for Healthcare Providers: IncredibleEmployment.be  This test is not yet approved or cleared by the [place] FDA and has been authorized for detection and/or diagnosis of SARS-CoV-2 by FDA under an Emergency Use Authorization (EUA). This EUA will remain in effect (meaning this test can be used) for the duration of the COVID-19 declaration under Section 564(b)(1) of the Act, 21 U.S.C. section 360bbb-3(b)(1), unless the authorization is terminated or revoked.  Performed at [facility], [place] [address] [place], [place] [address]   SARS CORONAVIRUS 2 (TAT 6-24 HRS) Nasopharyngeal Nasopharyngeal Swab     Status: None   Collection Time: 09/12/20  4:44 PM   Specimen: Nasopharyngeal Swab  Result Value Ref Range Status   SARS Coronavirus 2 NEGATIVE NEGATIVE Final    Comment: (NOTE) SARS-CoV-2 target nucleic acids are NOT DETECTED.  The SARS-CoV-2 RNA is generally detectable in upper and lower respiratory specimens during the acute phase of infection. Negative results do not preclude SARS-CoV-2 infection, do not rule out co-infections with other pathogens, and should not be used as the sole basis for treatment or other patient management decisions. Negative results must be combined with clinical observations, patient history, and epidemiological information. The expected result is Negative.  Fact Sheet for Patients: SugarRoll.be  Fact Sheet  for Healthcare Providers: https://www.woods-mathews.com/  This test is not  yet approved or cleared by the [place] and  has been authorized for detection and/or diagnosis of SARS-CoV-2 by FDA under an Emergency Use Authorization (EUA). This EUA will remain  in effect (meaning this test can be used) for the duration of the COVID-19 declaration under Se ction 564(b)(1) of the Act, 21 U.S.C. section 360bbb-3(b)(1), unless the authorization is terminated or revoked sooner.  Performed at [facility], [place] [address], [place], [place] [address]      Labs: Basic Metabolic Panel: Recent Labs  Lab 09/06/20 1238 09/06/20 1245 09/13/20 0750  NA 140 142 137  K 3.3* 3.6 3.8  CL 107 105 106  CO2 25  --  27  GLUCOSE 137* 139* 120*  BUN '10 11 11  ' CREATININE 0.93 0.90 0.76  CALCIUM 8.6*  --  8.3*   Liver Function Tests: Recent Labs  Lab 09/06/20 1238 09/13/20 0750  AST 17 21  ALT 9 17  ALKPHOS 59 52  BILITOT 0.4 0.2*  PROT 6.7 5.9*  ALBUMIN 3.4* 2.6*   No results for input(s): LIPASE, AMYLASE in the last 168 hours. No results for input(s): AMMONIA in the last 168 hours. CBC: Recent Labs  Lab 09/06/20 1238 09/06/20 1245 09/13/20 0750  WBC 4.0  --  4.3  NEUTROABS 2.1  --  2.6  HGB 9.5* 10.5* 8.4*  HCT 32.8* 31.0* 28.7*  MCV 78.5*  --  80.4  PLT 321  --  268   Cardiac Enzymes: No results for input(s): CKTOTAL, CKMB, CKMBINDEX, TROPONINI in the last 168 hours. BNP: BNP (last 3 results) No results for input(s): BNP in the last 8760 hours.  ProBNP (last 3 results) No results for input(s): PROBNP in the last 8760 hours.  CBG: Recent Labs  Lab 09/12/20 0847 09/12/20 1425 09/12/20 1703 09/12/20 2106 09/13/20 0611  GLUCAP 114* 182* 98 146* 126*       Signed:  Nita Sells MD   [facility] 09/13/2020, 9:49 AM

## 2020-09-13 NOTE — Plan of Care (Signed)
  Problem: Ischemic Stroke/TIA Tissue Perfusion: Goal: Complications of ischemic stroke/TIA will be minimized 09/13/2020 0300 by Ronalee Red, RN Outcome: Progressing 09/13/2020 0300 by Ronalee Red, RN Outcome: Progressing

## 2020-09-14 LAB — GLUCOSE, CAPILLARY
Glucose-Capillary: 135 mg/dL — ABNORMAL HIGH (ref 70–99)
Glucose-Capillary: 106 mg/dL — ABNORMAL HIGH (ref 70–99)

## 2020-09-14 NOTE — Progress Notes (Signed)
Discharge paperwork reviewed with patient and belongings given to her. Patient was ac ting impulsive and ready to leave and not listening to discharge instructions but has paperwork.

## 2020-09-14 NOTE — Progress Notes (Signed)
See updated d/c summary 4/1  Patient to be d/c home and will rescind IVC  Pleas Koch, MD [facility] 11:26 AM

## 2020-09-14 NOTE — Plan of Care (Signed)
  Problem: Health Behavior/Discharge Planning: Goal: Ability to manage health-related needs will improve Outcome: Adequate for Discharge   Problem: Clinical Measurements: Goal: Ability to maintain clinical measurements within normal limits will improve Outcome: Adequate for Discharge Goal: Will remain free from infection Outcome: Adequate for Discharge Goal: Diagnostic test results will improve Outcome: Adequate for Discharge Goal: Respiratory complications will improve Outcome: Adequate for Discharge Goal: Cardiovascular complication will be avoided Outcome: Adequate for Discharge   Problem: Activity: Goal: Risk for activity intolerance will decrease Outcome: Adequate for Discharge   Problem: Nutrition: Goal: Adequate nutrition will be maintained Outcome: Adequate for Discharge   Problem: Coping: Goal: Level of anxiety will decrease Outcome: Adequate for Discharge   Problem: Elimination: Goal: Will not experience complications related to bowel motility Outcome: Adequate for Discharge Goal: Will not experience complications related to urinary retention Outcome: Adequate for Discharge   Problem: Pain Managment: Goal: General experience of comfort will improve Outcome: Adequate for Discharge   Problem: Safety: Goal: Ability to remain free from injury will improve Outcome: Adequate for Discharge   Problem: Skin Integrity: Goal: Risk for impaired skin integrity will decrease Outcome: Adequate for Discharge   Problem: Ischemic Stroke/TIA Tissue Perfusion: Goal: Complications of ischemic stroke/TIA will be minimized Outcome: Adequate for Discharge   Problem: Education: Goal: Knowledge of General Education information will improve Description: Including pain rating scale, medication(s)/side effects and non-pharmacologic comfort measures Outcome: Completed/Met

## 2020-09-14 NOTE — Progress Notes (Signed)
Occupational Therapy Treatment Patient Details Name: Kaitlyn Gray MRN: [id number] DOB: 1976/01/06 Today's Date: 09/14/2020    History of present illness 45 y/o female presented to ED with acute onset of worsening L sided weakness and facial droop relative to her chronic post-stroke baseline. Current deficits are pre-existing but per patient, are acutely worsening. Head CT and MRI of brain are negative for acute abnormalities. PMH: stroke 2021, type 2 DM, BPD, MDD, polysubstance abuse, hypokalemia   OT comments  Pt progressing towards established OT goals. Pt very happy this morning and agreeable to perform grooming at sink. Pt performing grooming, toileting, UB dressing, and functional mobility with Min A for balance and safety. Pt continues to present with poor safety awareness, impulsivity, and decreased balance. Continue to recommend dc to SNF and will continue to follow acutely as admitted.    Follow Up Recommendations  SNF;Supervision/Assistance - 24 hour    Equipment Recommendations  None recommended by OT    Recommendations for Other Services      Precautions / Restrictions Precautions Precautions: Fall Precaution Comments: erratic behavior Restrictions Weight Bearing Restrictions: No       Mobility Bed Mobility Overal bed mobility: Needs Assistance Bed Mobility: Supine to Sit     Supine to sit: Min guard     General bed mobility comments: Min Guard A for safety    Transfers Overall transfer level: Needs assistance   Transfers: Sit to/from Stand Sit to Stand: Min assist         General transfer comment: Min A for balance    Balance Overall balance assessment: Needs assistance Sitting-balance support: No upper extremity supported;Feet supported Sitting balance-Leahy Scale: Fair     Standing balance support: During functional activity;Single extremity supported Standing balance-Leahy Scale: Fair Standing balance comment: Able to maintain static  standing balance. Benefiting from single hand held A for support                           ADL either performed or assessed with clinical judgement   ADL Overall ADL's : Needs assistance/impaired     Grooming: Min guard;Minimal assistance;Standing;Oral care;Wash/dry face;Wash/dry hands Grooming Details (indicate cue type and reason): requires assist to apply deodorant to R underarm due to L arm pain and decreased functional use         Upper Body Dressing : Minimal assistance;Sitting Upper Body Dressing Details (indicate cue type and reason): Min A for donning new gown     Toilet Transfer: Minimal assistance;Ambulation;BSC Toilet Transfer Details (indicate cue type and reason): Min A for safety and single hand held A Toileting- Clothing Manipulation and Hygiene: Minimal assistance;Sit to/from stand Toileting - Clothing Manipulation Details (indicate cue type and reason): Min A for balance in standing as pt performed peri care     Functional mobility during ADLs: Minimal assistance (single hand held A) General ADL Comments: Pt performing toileting, UB dressing,groomign at sink, and functional mobility with Min A. Pt continues to present with poor balance and safety awareness     Vision   Vision Assessment?: No apparent visual deficits   Perception     Praxis      Cognition Arousal/Alertness: Awake/alert Behavior During Therapy: Restless;Impulsive Overall Cognitive Status: No family/caregiver present to determine baseline cognitive functioning                                 General Comments:  Upon arrival, pt very gitty and happy to see therapist. Pt impulsive in movements and requiring cues for safety. Pt tangiental in conversation. Pt with poor problem solving and attention. Very poor safety awareness.        Exercises     Shoulder Instructions       General Comments      Pertinent Vitals/ Pain       Pain Assessment: Faces Faces Pain  Scale: No hurt Pain Intervention(s): Monitored during session  Home Living                                          Prior Functioning/Environment              Frequency  Min 2X/week        Progress Toward Goals  OT Goals(current goals can now be found in the care plan section)  Progress towards OT goals: Progressing toward goals  Acute Rehab OT Goals Patient Stated Goal: to get better OT Goal Formulation: With patient Time For Goal Achievement: 09/21/20 Potential to Achieve Goals: Good ADL Goals Additional ADL Goal #1: Patient will complete a.m. ADLs with Mod I, LRAD and good safety awareness. Additional ADL Goal #2: Patient will recall 3 fall prevention techniques to reduce risk of falls in prep for safe return home.  Plan Discharge plan remains appropriate;Frequency remains appropriate    Co-evaluation                 AM-PAC OT "6 Clicks" Daily Activity     Outcome Measure   Help from another person eating meals?: A Little Help from another person taking care of personal grooming?: A Little Help from another person toileting, which includes using toliet, bedpan, or urinal?: A Little Help from another person bathing (including washing, rinsing, drying)?: A Lot Help from another person to put on and taking off regular upper body clothing?: A Lot Help from another person to put on and taking off regular lower body clothing?: A Lot 6 Click Score: 15    End of Session Equipment Utilized During Treatment: Gait belt  OT Visit Diagnosis: Unsteadiness on feet (R26.81);Muscle weakness (generalized) (M62.81);History of falling (Z91.81);Hemiplegia and hemiparesis Hemiplegia - Right/Left: Left Hemiplegia - caused by: Unspecified   Activity Tolerance Patient tolerated treatment well   Patient Left with call bell/phone within reach;in bed;with nursing/sitter in room   Nurse Communication Mobility status        Time: 6967-8938 OT Time  Calculation (min): 16 min  Charges: OT General Charges $OT Visit: 1 Visit OT Treatments $Self Care/Home Management : 8-22 mins  Kaitlyn Gray MSOT, OTR/L Acute Rehab Pager: [phone] Office: [phone]   Kaitlyn Gray 09/14/2020, 11:18 AM

## 2020-09-14 NOTE — Consult Note (Signed)
[facility] Face-to-Face Psychiatry Consult   Reason for Consult:  capacity Referring Physician: Dr. Pleas Koch Patient Identification: Kaitlyn Gray MRN:  [id number] Principal Diagnosis: <principal problem not specified> Diagnosis:  Active Problems:   Bipolar disorder (HCC)   Diabetes mellitus without complication (HCC)   Hypertension   Ischemic stroke (HCC)   Normocytic anemia   CVA (cerebral vascular accident) (HCC)   Total Time spent with patient: 30 minutes  Subjective:   Kaitlyn Gray is a 45 y.o. female patient admitted for r/o stroke. Psych consult placed for capacity. This practitioner contacted Dr. Mahala Menghini to obtain more information about what specific components of capacity were we assessing for to include patient refusing SNF and expressing interest to go home. She states she was given medications on both days that impacted her memory and ability to participate. " I refused all medications because I knew you were coming this morning and I didn't want to miss you or my doctor. I dont need to go to therapy because I have a handicap rail, a shower chair, 2 walkers and a cane. I can do my own physical therapy. If I am weak I use my cane, for my safety. "  As per patient she is doing well, and has not been displaying any disruptive behaviors. She is able to provide the name of her home address, who resides in the home and how long she has live there. "She states last time she was admitted all 4 of my cats died. I have a kitten at home that I need to get to." Sicne my last stroke I talk to my family everyday, sometimes once or twice a day.  " She reports compliance with her medications at this time. She denies any suicidal thoughts or hallucinations at this time. [name]   HPI: black? Homeless female bipolar (not adherent on Seroquel Lexapro Abilify), polysubstance abuse cocaine, medication nonadherence Prior CVA/multiple sclerosis 01/03/2020 in addition to right pontine infarct  02/14/2020 Prior right thigh abscess 2019 status post treatment with multiple attempts at discharge AMA   Admit with new onset left-sided weakness Work-up with CT head and MRI brain showed no ischemia She has been deemed unsafe to discharge home on oral   psychiatry saw her on 3/30 and deemed her as not having capacity to make medical decisions for herself--social worker aware and will discuss with supervisory committee in charge of planning for making her a ward of the state if there is no responsible party to take care of her reliably   Past Psychiatric History: Bipolar 1 disorder and polysubstance abuse. She is taking Seroquel 200mg  po daily for symptoms management.   Risk to Self:  denies Risk to Others:  denies Prior Inpatient Therapy:  [facility] Prior Outpatient Therapy:  [facility], [facility]  Past Medical History:  Past Medical History:  Diagnosis Date  . Bipolar 1 disorder (HCC)   . 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 Hypertension   . Stroke [facility])     Past Surgical History:  Procedure Laterality Date  . CESAREAN SECTION     Family History:  Family History  Problem Relation Age of Onset  . Hypertension Mother   . CAD Mother 47       died of MI at age 70  . Hypertension Father    Family Psychiatric  History:  Social History:  Social History   Substance and Sexual Activity  Alcohol Use No  Social History   Substance and Sexual Activity  Drug Use Yes  . Types: Marijuana, Cocaine    Social History   Socioeconomic History  . Marital status: Single    Spouse name: Not on file  . Number of children: Not on file  . Years of education: Not on file  . Highest education level: Not on file  Occupational History  . Not on file  Tobacco Use  . Smoking status: Current Every Day Smoker    Packs/day: 0.50    Types: Cigarettes  . Smokeless tobacco: Never Used  Vaping Use  . Vaping Use: Never  used  Substance and Sexual Activity  . Alcohol use: No  . Drug use: Yes    Types: Marijuana, Cocaine  . Sexual activity: Not on file  Other Topics Concern  . Not on file  Social History Narrative  . Not on file   Social Determinants of Health   Financial Resource Strain: Not on file  Food Insecurity: Not on file  Transportation Needs: Not on file  Physical Activity: Not on file  Stress: Not on file  Social Connections: Not on file   Additional Social History:    Allergies:   Allergies  Allergen Reactions  . Hydrocodone Itching  . Latex Rash    Labs:  Results for orders placed or performed during the hospital encounter of 09/06/20 (from the past 48 hour(s))  Glucose, capillary     Status: Abnormal   Collection Time: 09/12/20  2:25 PM  Result Value Ref Range   Glucose-Capillary 182 (H) 70 - 99 mg/dL    Comment: Glucose reference range applies only to samples taken after fasting for at least 8 hours.  SARS CORONAVIRUS 2 (TAT 6-24 HRS) Nasopharyngeal Nasopharyngeal Swab     Status: None   Collection Time: 09/12/20  4:44 PM   Specimen: Nasopharyngeal Swab  Result Value Ref Range   SARS Coronavirus 2 NEGATIVE NEGATIVE    Comment: (NOTE) SARS-CoV-2 target nucleic acids are NOT DETECTED.  The SARS-CoV-2 RNA is generally detectable in upper and lower respiratory specimens during the acute phase of infection. Negative results do not preclude SARS-CoV-2 infection, do not rule out co-infections with other pathogens, and should not be used as the sole basis for treatment or other patient management decisions. Negative results must be combined with clinical observations, patient history, and epidemiological information. The expected result is Negative.  Fact Sheet for Patients: HairSlick.no  Fact Sheet for Healthcare Providers: quierodirigir.com  This test is not yet approved or cleared by the [place] FDA and   has been authorized for detection and/or diagnosis of SARS-CoV-2 by FDA under an Emergency Use Authorization (EUA). This EUA will remain  in effect (meaning this test can be used) for the duration of the COVID-19 declaration under Se ction 564(b)(1) of the Act, 21 U.S.C. section 360bbb-3(b)(1), unless the authorization is terminated or revoked sooner.  Performed at [facility], [address], [place], [place] [address]   Glucose, capillary     Status: None   Collection Time: 09/12/20  5:03 PM  Result Value Ref Range   Glucose-Capillary 98 70 - 99 mg/dL    Comment: Glucose reference range applies only to samples taken after fasting for at least 8 hours.  Glucose, capillary     Status: Abnormal   Collection Time: 09/12/20  9:06 PM  Result Value Ref Range   Glucose-Capillary 146 (H) 70 - 99 mg/dL    Comment: Glucose reference  range applies only to samples taken after fasting for at least 8 hours.   Comment 1 Notify RN    Comment 2 Document in Chart   Glucose, capillary     Status: Abnormal   Collection Time: 09/13/20  6:11 AM  Result Value Ref Range   Glucose-Capillary 126 (H) 70 - 99 mg/dL    Comment: Glucose reference range applies only to samples taken after fasting for at least 8 hours.   Comment 1 Notify RN    Comment 2 Document in Chart   Comprehensive metabolic panel     Status: Abnormal   Collection Time: 09/13/20  7:50 AM  Result Value Ref Range   Sodium 137 135 - 145 mmol/L   Potassium 3.8 3.5 - 5.1 mmol/L   Chloride 106 98 - 111 mmol/L   CO2 27 22 - 32 mmol/L   Glucose, Bld 120 (H) 70 - 99 mg/dL    Comment: Glucose reference range applies only to samples taken after fasting for at least 8 hours.   BUN 11 6 - 20 mg/dL   Creatinine, Ser 3.53 0.44 - 1.00 mg/dL   Calcium 8.3 (L) 8.9 - 10.3 mg/dL   Total Protein 5.9 (L) 6.5 - 8.1 g/dL   Albumin 2.6 (L) 3.5 - 5.0 g/dL   AST 21 15 - 41 U/L   ALT 17 0 - 44 U/L   Alkaline Phosphatase 52 38 - 126 U/L   Total  Bilirubin 0.2 (L) 0.3 - 1.2 mg/dL   GFR, Estimated >61 >44 mL/min    Comment: (NOTE) Calculated using the CKD-EPI Creatinine Equation (2021)    Anion gap 4 (L) 5 - 15    Comment: Performed at [facility], [address], [place], [place] [address]  CBC with Differential/Platelet     Status: Abnormal   Collection Time: 09/13/20  7:50 AM  Result Value Ref Range   WBC 4.3 4.0 - 10.5 K/uL   RBC 3.57 (L) 3.87 - 5.11 MIL/uL   Hemoglobin 8.4 (L) 12.0 - 15.0 g/dL   HCT 08.6 (L) 76.1 - 95.0 %   MCV 80.4 80.0 - 100.0 fL   MCH 23.5 (L) 26.0 - 34.0 pg   MCHC 29.3 (L) 30.0 - 36.0 g/dL   RDW 93.2 (H) 67.1 - 24.5 %   Platelets 268 150 - 400 K/uL   nRBC 0.0 0.0 - 0.2 %   Neutrophils Relative % 59 %   Neutro Abs 2.6 1.7 - 7.7 K/uL   Lymphocytes Relative 27 %   Lymphs Abs 1.2 0.7 - 4.0 K/uL   Monocytes Relative 9 %   Monocytes Absolute 0.4 0.1 - 1.0 K/uL   Eosinophils Relative 4 %   Eosinophils Absolute 0.2 0.0 - 0.5 K/uL   Basophils Relative 1 %   Basophils Absolute 0.0 0.0 - 0.1 K/uL   Immature Granulocytes 0 %   Abs Immature Granulocytes 0.01 0.00 - 0.07 K/uL    Comment: Performed at [facility], [address] [place], [place] [address]  Glucose, capillary     Status: Abnormal   Collection Time: 09/13/20 12:21 PM  Result Value Ref Range   Glucose-Capillary 168 (H) 70 - 99 mg/dL    Comment: Glucose reference range applies only to samples taken after fasting for at least 8 hours.  Glucose, capillary     Status: Abnormal   Collection Time: 09/13/20  4:23 PM  Result Value Ref Range   Glucose-Capillary 121 (H) 70 -  99 mg/dL    Comment: Glucose reference range applies only to samples taken after fasting for at least 8 hours.  Glucose, capillary     Status: Abnormal   Collection Time: 09/13/20  9:30 PM  Result Value Ref Range   Glucose-Capillary 148 (H) 70 - 99 mg/dL    Comment: Glucose reference range applies only to samples taken after fasting for at least 8 hours.    Comment 1 Notify RN    Comment 2 Document in Chart   Glucose, capillary     Status: Abnormal   Collection Time: 09/14/20  6:47 AM  Result Value Ref Range   Glucose-Capillary 135 (H) 70 - 99 mg/dL    Comment: Glucose reference range applies only to samples taken after fasting for at least 8 hours.    Current Facility-Administered Medications  Medication Dose Route Frequency Provider Last Rate Last Admin  . acetaminophen (TYLENOL) tablet 650 mg  650 mg Oral Q4H PRN Mikey College T, MD   650 mg at 09/12/20 2200   Or  . acetaminophen (TYLENOL) 160 MG/5ML solution 650 mg  650 mg Per Tube Q4H PRN Mikey College T, MD       Or  . acetaminophen (TYLENOL) suppository 650 mg  650 mg Rectal Q4H PRN Mikey College T, MD      . aspirin chewable tablet 81 mg  81 mg Oral Daily Mikey College T, MD   81 mg at 09/14/20 0910  . atorvastatin (LIPITOR) tablet 80 mg  80 mg Oral Daily Mikey College T, MD   80 mg at 09/14/20 0910  . buprenorphine-naloxone (SUBOXONE) 8-2 mg per SL tablet 1 tablet  1 tablet Sublingual TID Emeline General, MD   1 tablet at 09/13/20 0931  . enoxaparin (LOVENOX) injection 40 mg  40 mg Subcutaneous Q24H Mikey College T, MD   40 mg at 09/12/20 1456  . ferrous sulfate tablet 325 mg  325 mg Oral Daily Mikey College T, MD   325 mg at 09/13/20 0931  . gabapentin (NEURONTIN) capsule 400 mg  400 mg Oral BID Mikey College T, MD   400 mg at 09/14/20 2458  . hydrALAZINE (APRESOLINE) tablet 25 mg  25 mg Oral Q6H PRN Mikey College T, MD      . hydrOXYzine (ATARAX/VISTARIL) tablet 12.5 mg  12.5 mg Oral Q6H PRN Rhetta Mura, MD      . insulin aspart (novoLOG) injection 0-15 Units  0-15 Units Subcutaneous TID WC Marinda Elk, MD   3 Units at 09/14/20 0912  . insulin aspart (novoLOG) injection 0-5 Units  0-5 Units Subcutaneous QHS Marinda Elk, MD   3 Units at 09/10/20 2112  . insulin aspart (novoLOG) injection 3 Units  3 Units Subcutaneous TID WC Marinda Elk, MD   3 Units at 09/14/20 [phone]   . insulin detemir (LEVEMIR) injection 5 Units  5 Units Subcutaneous BID Marinda Elk, MD   5 Units at 09/14/20 [phone]  . lisinopril (ZESTRIL) tablet 10 mg  10 mg Oral Daily Marinda Elk, MD   10 mg at 09/14/20 0911  . metFORMIN (GLUCOPHAGE) tablet 500 mg  500 mg Oral Q breakfast Rhetta Mura, MD   500 mg at 09/13/20 0931  . ondansetron (ZOFRAN) injection 4 mg  4 mg Intravenous Q6H PRN Marinda Elk, MD   4 mg at 09/08/20 1723  . QUEtiapine (SEROQUEL) tablet 200 mg  200 mg Oral QHS Emeline General, MD   200 mg  at 09/12/20 2200  . senna-docusate (Senokot-S) tablet 1 tablet  1 tablet Oral QHS PRN Emeline GeneralZhang, Ping T, MD        Musculoskeletal: Strength & Muscle Tone: not tested Gait & Station: not tested Patient leans: N/A  Psychiatric Specialty Exam: Physical Exam Psychiatric:        Attention and Perception: Attention normal.        Mood and Affect: Mood normal.        Speech: Speech is delayed and slurred.        Behavior: Behavior normal. Behavior is cooperative.        Thought Content: Thought content normal.        Cognition and Memory: Cognition and memory normal.        Judgment: Judgment normal.     Review of Systems  Psychiatric/Behavioral: Negative.     Blood pressure (!) 148/99, pulse 83, temperature 98.8 F (37.1 C), temperature source Oral, resp. rate 18, height 5\' 3"  (1.6 m), weight 91.5 kg, SpO2 96 %.Body mass index is 35.73 kg/m.  General Appearance: Casual  Eye Contact:  Good  Speech:  Clear and Coherent and Pressured 2/t cva  Volume:  Normal  Mood:  Euphoric  Affect:  Appropriate and Congruent  Thought Process:  Coherent, Linear and Descriptions of Associations: Circumstantial  Orientation:  Full (Time, Place, and Person)  Thought Content:  Logical  Suicidal Thoughts:  No  Homicidal Thoughts:  No  Memory:  Immediate;   Fair Recent;   Fair Remote;   Good  Judgement:  Intact  Insight:  Fair  Psychomotor Activity:  Normal   Concentration:  Concentration: Fair and Attention Span: Fair  Recall:  FiservFair  Fund of Knowledge:  Fair  Language:  Fair  Akathisia:  No  Handed:  Right  AIMS (if indicated):     Assets:  Desire for Improvement  ADL's:  Marginal  Cognition:  WNL  Sleep:    fair     Treatment Plan Summary: 45 year old female with history of Bipolar depression, Cannabis/Cocaine use disorder who was admitted with Stroke symptoms most likely related to Cocaine use. Patient is alert, cooperate, denies self harming thoughts.  Recommendations: -Discharge patient on home medications of Seroquel 200mg  po daily to help with mood stabilization.  -Will recommend she follow up with outpatient psychiatrist, and ensure they are able to continue following her even if it is virtual.  -Patient does appear to have the capacity to make her decision to go home. It is with reservation that she stay in the hospital to complete her rehabilitation.    Disposition: No evidence of imminent risk to self or others at present.   Patient does not meet criteria for psychiatric inpatient admission. Supportive therapy provided about ongoing stressors. Psychiatric service signing out. Re-consult as needed  Maryagnes Amosakia S Starkes-Perry, FNP 09/14/2020 11:01 AM

## 2020-09-14 NOTE — TOC Transition Note (Addendum)
Transition of Care [facility]) - CM/SW Discharge Note   Patient Details  Name: Kaitlyn Gray MRN: [id number] Date of Birth: 09-Jun-1976  [facility] CM/SW Contact:  Kermit Balo, RN Phone Number: 09/14/2020, 11:50 AM   Clinical Narrative:    Psyche has cleared the patient and IVC rescinded. She is choosing to d/c home. CM unable to arrange any HH d/t her having Medicaid and being cocaine + on admission.  Pt is not active with ACT team but [facility] and suboxone treatments. Pt doesn't feel she needs further counseling for substance abuse. Pt needs transport home. CM will provide the bedside RN a cab voucher for when she is ready to leave.    Final next level of care: Home/Self Care Barriers to Discharge: No Barriers Identified   Patient Goals and CMS Choice Patient states their goals for this hospitalization and ongoing recovery are:: to get an electric wheelchair CMS Medicare.gov Compare Post Acute Care list provided to:: Patient Choice offered to / list presented to : Patient  Discharge Placement                       Discharge Plan and Services     Post Acute Care Choice: Skilled Nursing Facility                               Social Determinants of Health (SDOH) Interventions     Readmission Risk Interventions No flowsheet data found.

## 2020-09-14 NOTE — Progress Notes (Signed)
CSW rescinded patient's IVC, she is cleared for discharge.  Blenda Nicely, [place] Clinical Social Worker [phone]

## 2020-09-18 ENCOUNTER — Other Ambulatory Visit: Payer: Self-pay

## 2020-09-18 ENCOUNTER — Emergency Department (HOSPITAL_COMMUNITY)
Admission: EM | Admit: 2020-09-18 | Discharge: 2020-09-18 | Disposition: A | Discharge status: Home / Self Care | Payer: Medicaid Other | Attending: Emergency Medicine | Admitting: Emergency Medicine

## 2020-09-18 ENCOUNTER — Encounter (HOSPITAL_COMMUNITY): Payer: Self-pay | Admitting: Pharmacy Technician

## 2020-09-18 DIAGNOSIS — T148XXA Other injury of unspecified body region, initial encounter: Secondary | ICD-10-CM

## 2020-09-18 DIAGNOSIS — Z794 Long term (current) use of insulin: Secondary | ICD-10-CM | POA: Insufficient documentation

## 2020-09-18 DIAGNOSIS — Z8673 Personal history of transient ischemic attack (TIA), and cerebral infarction without residual deficits: Secondary | ICD-10-CM | POA: Diagnosis not present

## 2020-09-18 DIAGNOSIS — Z91040 Latex allergy status: Secondary | ICD-10-CM | POA: Insufficient documentation

## 2020-09-18 DIAGNOSIS — Z7982 Long term (current) use of aspirin: Secondary | ICD-10-CM | POA: Insufficient documentation

## 2020-09-18 DIAGNOSIS — Z79899 Other long term (current) drug therapy: Secondary | ICD-10-CM | POA: Insufficient documentation

## 2020-09-18 DIAGNOSIS — E119 Type 2 diabetes mellitus without complications: Secondary | ICD-10-CM | POA: Insufficient documentation

## 2020-09-18 DIAGNOSIS — Z7984 Long term (current) use of oral hypoglycemic drugs: Secondary | ICD-10-CM | POA: Diagnosis not present

## 2020-09-18 DIAGNOSIS — M25512 Pain in left shoulder: Secondary | ICD-10-CM | POA: Diagnosis present

## 2020-09-18 DIAGNOSIS — I10 Essential (primary) hypertension: Secondary | ICD-10-CM | POA: Insufficient documentation

## 2020-09-18 DIAGNOSIS — F17210 Nicotine dependence, cigarettes, uncomplicated: Secondary | ICD-10-CM | POA: Diagnosis not present

## 2020-09-18 NOTE — ED Triage Notes (Signed)
Pt bib ems from home with reports of L sided pain. Pain has been increasing since discharge on Friday. Pt with recent CVA, residual L sided deficits and slurred speech. VSS with ems.
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
## 2020-09-18 NOTE — ED Notes (Signed)
Patient left before discharge instructions could be given to patient.

## 2021-03-20 ENCOUNTER — Emergency Department (HOSPITAL_COMMUNITY): Payer: Medicaid Other

## 2021-03-20 ENCOUNTER — Emergency Department (HOSPITAL_COMMUNITY)
Admission: EM | Admit: 2021-03-20 | Discharge: 2021-03-20 | Disposition: A | Discharge status: Home / Self Care | Payer: Medicaid Other | Attending: Emergency Medicine | Admitting: Emergency Medicine

## 2021-03-20 ENCOUNTER — Other Ambulatory Visit: Payer: Self-pay

## 2021-03-20 ENCOUNTER — Encounter (HOSPITAL_COMMUNITY): Payer: Self-pay

## 2021-03-20 DIAGNOSIS — Z91040 Latex allergy status: Secondary | ICD-10-CM | POA: Diagnosis not present

## 2021-03-20 DIAGNOSIS — Z7982 Long term (current) use of aspirin: Secondary | ICD-10-CM | POA: Insufficient documentation

## 2021-03-20 DIAGNOSIS — Z20822 Contact with and (suspected) exposure to covid-19: Secondary | ICD-10-CM | POA: Diagnosis not present

## 2021-03-20 DIAGNOSIS — Z794 Long term (current) use of insulin: Secondary | ICD-10-CM | POA: Insufficient documentation

## 2021-03-20 DIAGNOSIS — F444 Conversion disorder with motor symptom or deficit: Secondary | ICD-10-CM

## 2021-03-20 DIAGNOSIS — I10 Essential (primary) hypertension: Secondary | ICD-10-CM | POA: Insufficient documentation

## 2021-03-20 DIAGNOSIS — F17210 Nicotine dependence, cigarettes, uncomplicated: Secondary | ICD-10-CM | POA: Diagnosis not present

## 2021-03-20 DIAGNOSIS — Z7984 Long term (current) use of oral hypoglycemic drugs: Secondary | ICD-10-CM | POA: Insufficient documentation

## 2021-03-20 DIAGNOSIS — Z79899 Other long term (current) drug therapy: Secondary | ICD-10-CM | POA: Insufficient documentation

## 2021-03-20 DIAGNOSIS — E119 Type 2 diabetes mellitus without complications: Secondary | ICD-10-CM | POA: Diagnosis not present

## 2021-03-20 DIAGNOSIS — R2990 Unspecified symptoms and signs involving the nervous system: Secondary | ICD-10-CM

## 2021-03-20 DIAGNOSIS — N9489 Other specified conditions associated with female genital organs and menstrual cycle: Secondary | ICD-10-CM | POA: Diagnosis not present

## 2021-03-20 DIAGNOSIS — R4781 Slurred speech: Secondary | ICD-10-CM | POA: Insufficient documentation

## 2021-03-20 DIAGNOSIS — R4789 Other speech disturbances: Secondary | ICD-10-CM

## 2021-03-20 DIAGNOSIS — R519 Headache, unspecified: Secondary | ICD-10-CM | POA: Diagnosis present

## 2021-03-20 LAB — DIFFERENTIAL
Abs Immature Granulocytes: 0.01 10*3/uL (ref 0.00–0.07)
Basophils Absolute: 0 10*3/uL (ref 0.0–0.1)
Basophils Relative: 1 %
Eosinophils Absolute: 0.1 10*3/uL (ref 0.0–0.5)
Eosinophils Relative: 2 %
Immature Granulocytes: 0 %
Lymphocytes Relative: 33 %
Lymphs Abs: 1.8 10*3/uL (ref 0.7–4.0)
Monocytes Absolute: 0.4 10*3/uL (ref 0.1–1.0)
Monocytes Relative: 7 %
Neutro Abs: 3.3 10*3/uL (ref 1.7–7.7)
Neutrophils Relative %: 57 %

## 2021-03-20 LAB — COMPREHENSIVE METABOLIC PANEL
ALT: 12 U/L (ref 0–44)
AST: 14 U/L — ABNORMAL LOW (ref 15–41)
Albumin: 3.3 g/dL — ABNORMAL LOW (ref 3.5–5.0)
Alkaline Phosphatase: 66 U/L (ref 38–126)
Anion gap: 10 (ref 5–15)
BUN: 13 mg/dL (ref 6–20)
CO2: 25 mmol/L (ref 22–32)
Calcium: 8.9 mg/dL (ref 8.9–10.3)
Chloride: 103 mmol/L (ref 98–111)
Creatinine, Ser: 0.86 mg/dL (ref 0.44–1.00)
GFR, Estimated: >60 mL/min (ref >60)
Glucose, Bld: 185 mg/dL — ABNORMAL HIGH (ref 70–99)
Potassium: 3.4 mmol/L — ABNORMAL LOW (ref 3.5–5.1)
Sodium: 138 mmol/L (ref 135–145)
Total Bilirubin: 0.3 mg/dL (ref 0.3–1.2)
Total Protein: 6.5 g/dL (ref 6.5–8.1)

## 2021-03-20 LAB — CBC
HCT: 31 % — ABNORMAL LOW (ref 36.0–46.0)
Hemoglobin: 9.1 g/dL — ABNORMAL LOW (ref 12.0–15.0)
MCH: 23.3 pg — ABNORMAL LOW (ref 26.0–34.0)
MCHC: 29.4 g/dL — ABNORMAL LOW (ref 30.0–36.0)
MCV: 79.3 fL — ABNORMAL LOW (ref 80.0–100.0)
Platelets: 347 10*3/uL (ref 150–400)
RBC: 3.91 MIL/uL (ref 3.87–5.11)
RDW: 17 % — ABNORMAL HIGH (ref 11.5–15.5)
WBC: 5.7 10*3/uL (ref 4.0–10.5)
nRBC: 0 % (ref 0.0–0.2)

## 2021-03-20 LAB — URINALYSIS, ROUTINE W REFLEX MICROSCOPIC
Bilirubin Urine: NEGATIVE
Glucose, UA: NEGATIVE mg/dL
Hgb urine dipstick: NEGATIVE
Ketones, ur: NEGATIVE mg/dL
Leukocytes,Ua: NEGATIVE
Nitrite: NEGATIVE
Protein, ur: NEGATIVE mg/dL
Specific Gravity, Urine: 1.006 (ref 1.005–1.030)
pH: 9 — ABNORMAL HIGH (ref 5.0–8.0)

## 2021-03-20 LAB — I-STAT BETA HCG BLOOD, ED (MC, WL, AP ONLY): I-stat hCG, quantitative: <5 m[IU]/mL (ref <5)

## 2021-03-20 LAB — I-STAT CHEM 8, ED
BUN: 14 mg/dL (ref 6–20)
Calcium, Ion: 1.09 mmol/L — ABNORMAL LOW (ref 1.15–1.40)
Chloride: 103 mmol/L (ref 98–111)
Creatinine, Ser: 0.8 mg/dL (ref 0.44–1.00)
Glucose, Bld: 182 mg/dL — ABNORMAL HIGH (ref 70–99)
HCT: 31 % — ABNORMAL LOW (ref 36.0–46.0)
Hemoglobin: 10.5 g/dL — ABNORMAL LOW (ref 12.0–15.0)
Potassium: 3.5 mmol/L (ref 3.5–5.1)
Sodium: 139 mmol/L (ref 135–145)
TCO2: 28 mmol/L (ref 22–32)

## 2021-03-20 LAB — RAPID URINE DRUG SCREEN, HOSP PERFORMED
Amphetamines: NOT DETECTED
Barbiturates: NOT DETECTED
Benzodiazepines: NOT DETECTED
Cocaine: POSITIVE — AB
Opiates: NOT DETECTED
Tetrahydrocannabinol: NOT DETECTED

## 2021-03-20 LAB — APTT: aPTT: 27 seconds (ref 24–36)

## 2021-03-20 LAB — CBG MONITORING, ED: Glucose-Capillary: 185 mg/dL — ABNORMAL HIGH (ref 70–99)

## 2021-03-20 LAB — RESP PANEL BY RT-PCR (FLU A&B, COVID) ARPGX2
Influenza A by PCR: NEGATIVE
Influenza B by PCR: NEGATIVE
SARS Coronavirus 2 by RT PCR: NEGATIVE

## 2021-03-20 LAB — PROTIME-INR
INR: 1 (ref 0.8–1.2)
Prothrombin Time: 13 seconds (ref 11.4–15.2)

## 2021-03-20 IMAGING — MR MR HEAD W/O CM
9 series · 48 of 48 positions shown · non-contrast
Comparison: Head CT same day.  MRI [DATE].

CLINICAL DATA: Code stroke protocol.  Slurred speech.

EXAM:
MRI HEAD WITHOUT CONTRAST
TECHNIQUE: Multiplanar, multiecho pulse sequences of the brain and surrounding
structures were obtained without intravenous contrast.

[Series 9: DWI · axial · 3.0mm · 0.88mm/px · z∈[-115,+35]mm · 11 of 104 slices shown (1 of 4)]
[im 1/104]
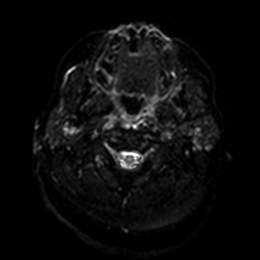
[im 11/104]
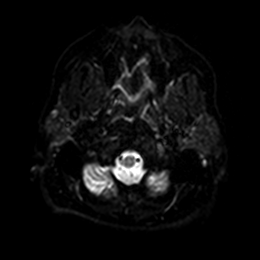
[im 21/104]
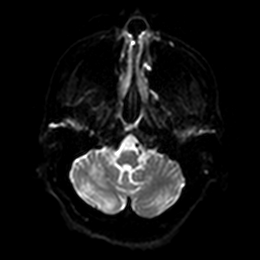
[im 31/104]
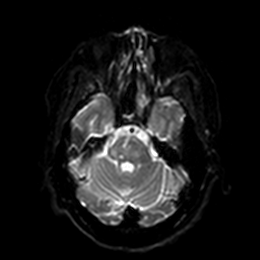
[im 42/104]
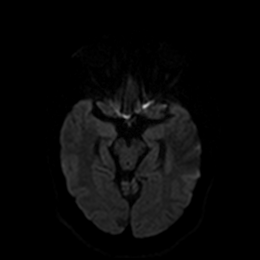
[im 52/104]
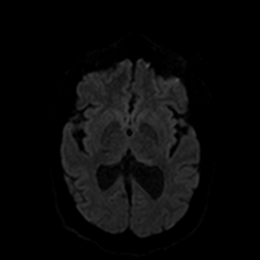
[im 62/104]
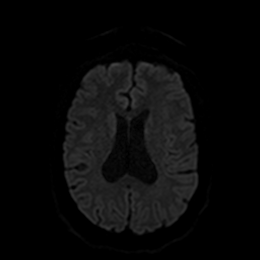
[im 73/104]
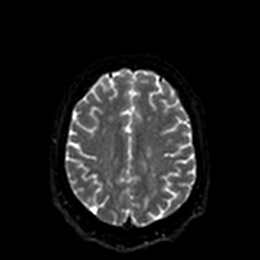
[im 83/104]
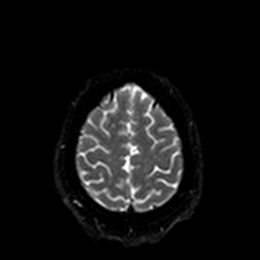
[im 93/104]
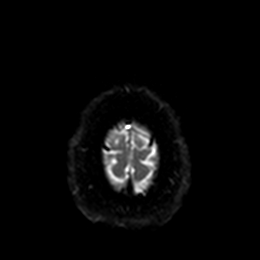
[im 104/104]
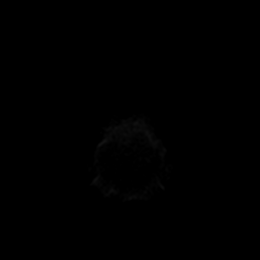

[Series 10: DWI · axial · 3.0mm · 0.88mm/px · z∈[-115,+35]mm · 5 of 52 slices shown (2 of 4)]
[im 1/52]
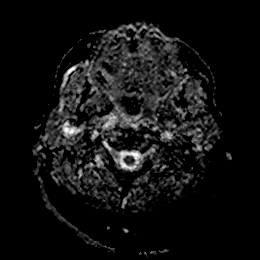
[im 13/52]
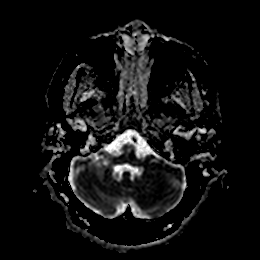
[im 26/52]
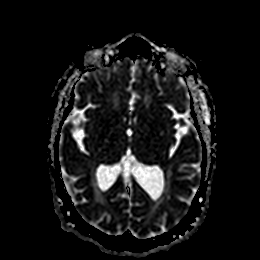
[im 39/52]
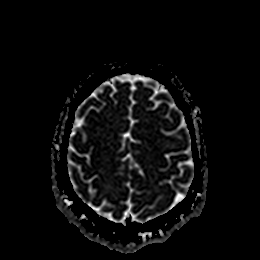
[im 52/52]
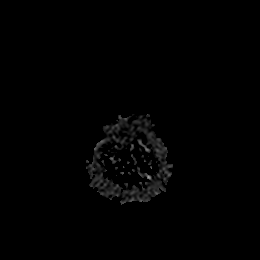

[Series 11: DWI · coronal · 4.0mm · 0.88mm/px · 6 of 64 slices shown (3 of 4)]
[im 1/64]
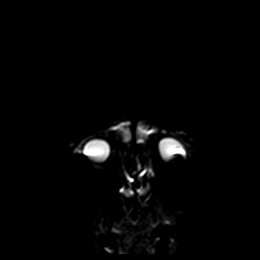
[im 13/64]
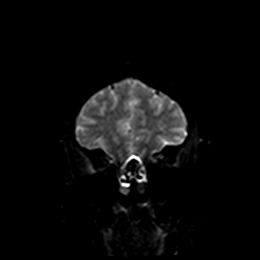
[im 26/64]
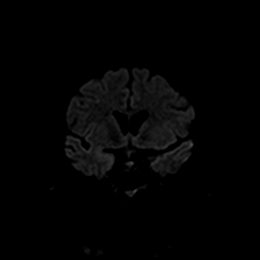
[im 38/64]
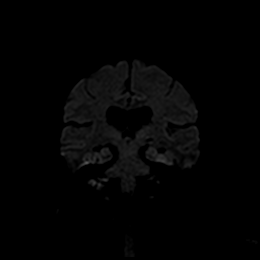
[im 51/64]
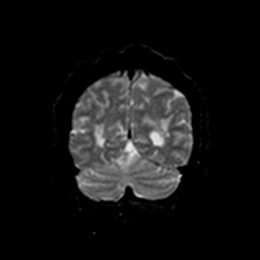
[im 64/64]
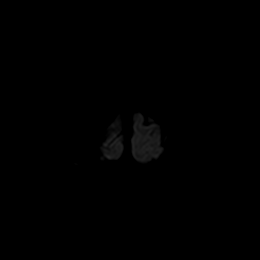

[Series 12: DWI · coronal · 4.0mm · 0.88mm/px · 3 of 32 slices shown (4 of 4)]
[im 1/32]
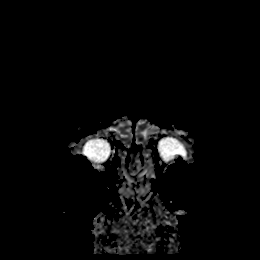
[im 16/32]
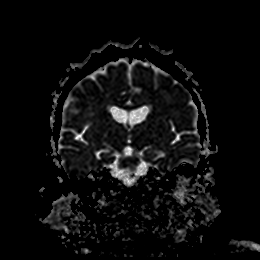
[im 32/32]
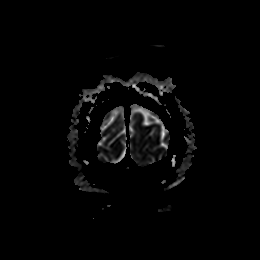

[Series 13: FLAIR · axial · 5.0mm · 0.45mm/px · z∈[-111,+31]mm · 3 of 25 slices shown]
[im 1/25]
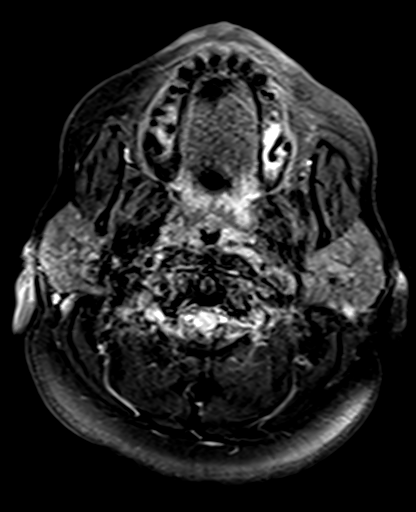
[im 13/25]
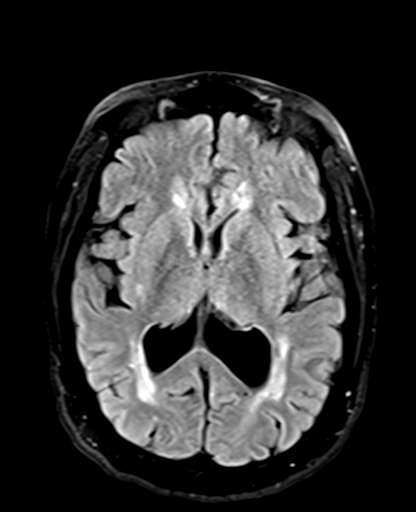
[im 25/25]
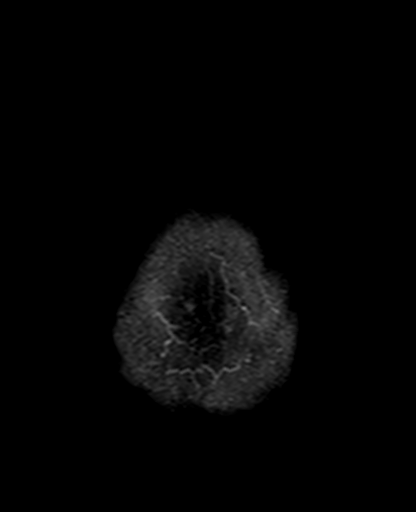

[Series 14: mag_images · axial · 3.0mm · 0.90mm/px · z∈[-116,+35]mm · 5 of 52 slices shown]
[im 1/52]
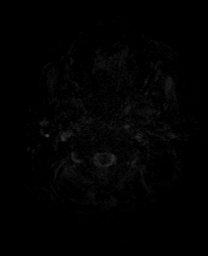
[im 13/52]
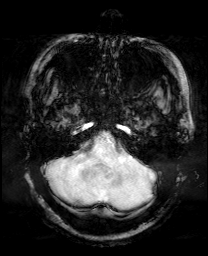
[im 26/52]
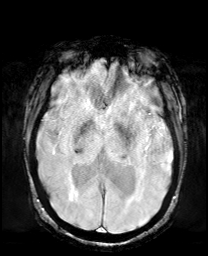
[im 39/52]
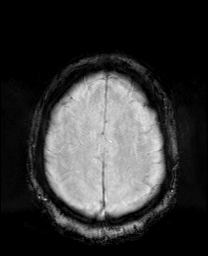
[im 52/52]
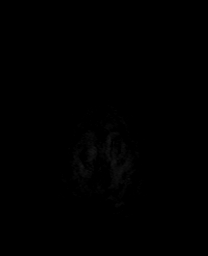

[Series 15: pha_images · axial · 3.0mm · 0.90mm/px · z∈[-116,+32]mm · 5 of 51 slices shown]
[im 1/51]
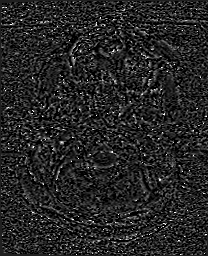
[im 13/51]
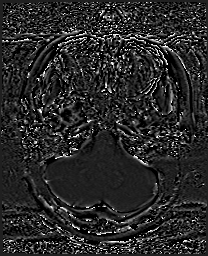
[im 26/51]
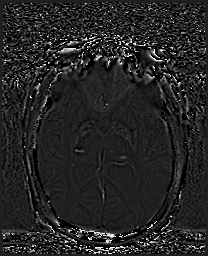
[im 38/51]
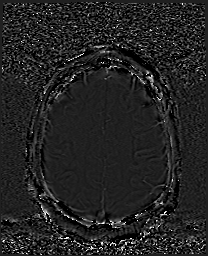
[im 51/51]
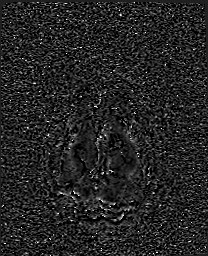

[Series 16: swi_images · axial · 3.0mm · 0.90mm/px · z∈[-116,+35]mm · 5 of 52 slices shown]
[im 1/52]
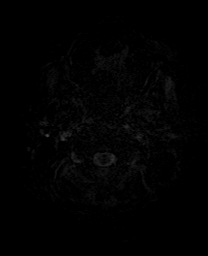
[im 13/52]
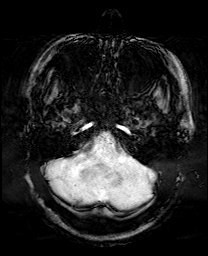
[im 26/52]
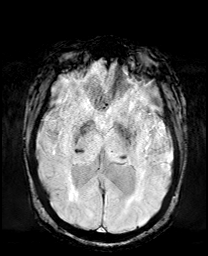
[im 39/52]
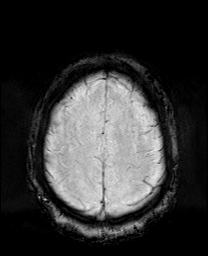
[im 52/52]
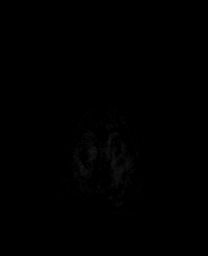

[Series 17: mip_images(sw) · axial · 24.0mm · 0.90mm/px · z∈[-105,+25]mm · 5 of 45 slices shown]
[im 1/45]
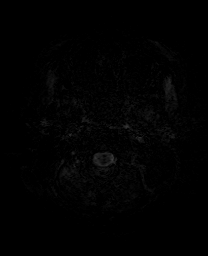
[im 12/45]
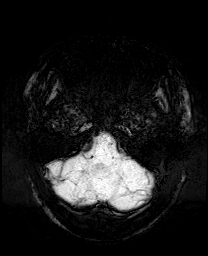
[im 23/45]
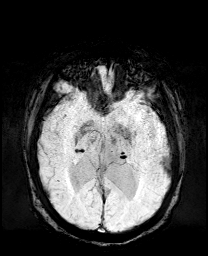
[im 34/45]
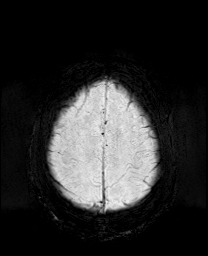
[im 45/45]
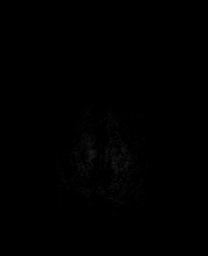

[48 of 48 positions shown; findings below may reference images not displayed]

FINDINGS: Brain: Diffusion imaging does not show any acute or subacute
infarction. Old infarction is seen affecting the right pons, right
middle cerebellar peduncle and right cerebellum. Old small vessel
infarctions of the thalami and within the cerebral hemispheric white
matter. Old infarction of the right occipital cortical and
subcortical brain. Foci of hemosiderin deposition related to the old
thalamic and posterior fossa insults. No evidence of mass lesion,
destructive hydrocephalus or extra-axial collection.

Vascular: Major vessels at the base of the brain show flow.

Skull and upper cervical spine: Negative

Sinuses/Orbits: Clear/normal

Other: None
IMPRESSION: No acute finding.

Old infarctions affecting the right pons, middle cerebellar peduncle
and right cerebellum. Old infarctions affecting the thalami. Old
infarction in the right occipital lobe. Chronic small-vessel
ischemic changes of the white matter.

## 2021-03-20 IMAGING — CT CT HEAD CODE STROKE
4 series · 16 of 47 positions shown, 18 images · non-contrast
Comparison: CT studies [DATE]

CLINICAL DATA: Code stroke. Slurred speech. Last seen normal [HB]
hours.

EXAM:
CT HEAD WITHOUT CONTRAST
TECHNIQUE: Contiguous axial images were obtained from the base of the skull
through the vertex without intravenous contrast.

[Series 3: head 5.0 st · axial · 0.41mm/px · z∈[-106,+14]mm · 7 of 32 slices shown, 9 images]
[im 4/32  brain]
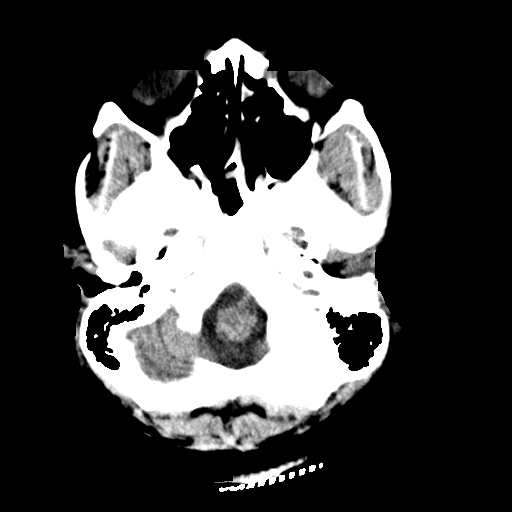
[im 4/32  bone]
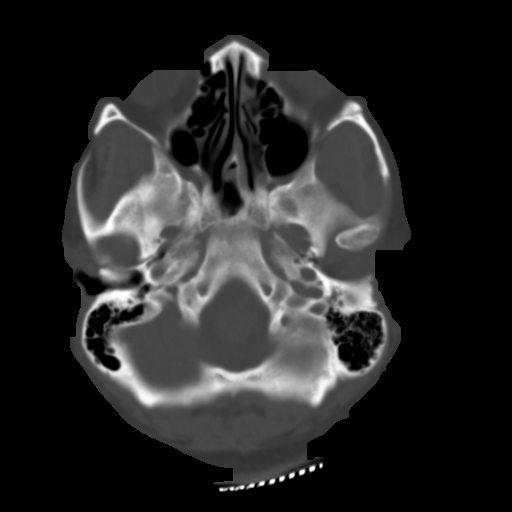
[im 8/32  brain]
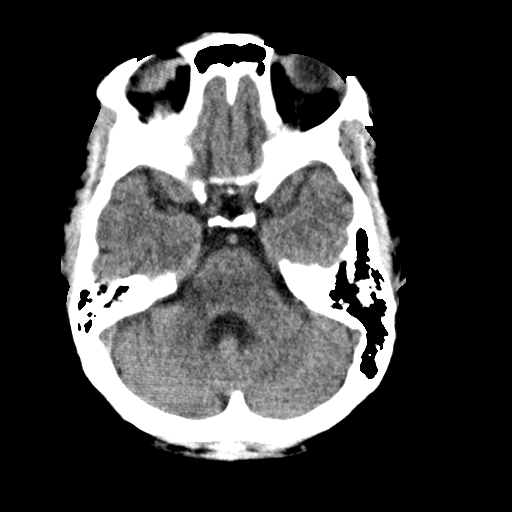
[im 12/32  brain]
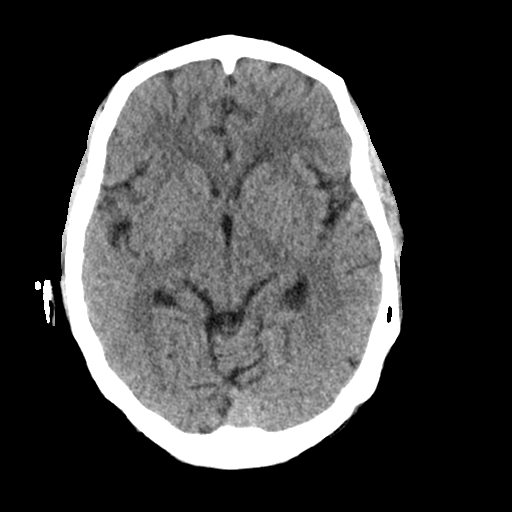
[im 16/32  brain]
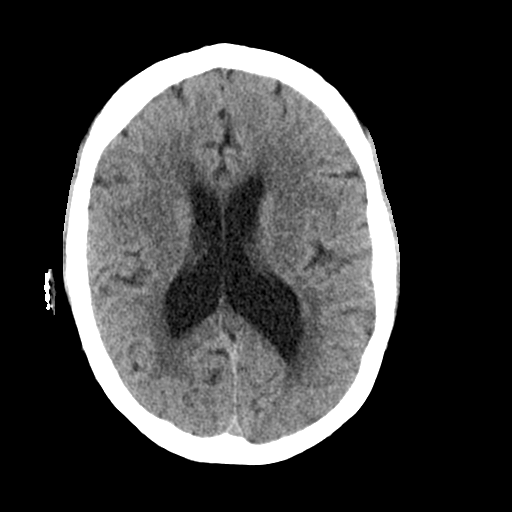
[im 20/32  brain]
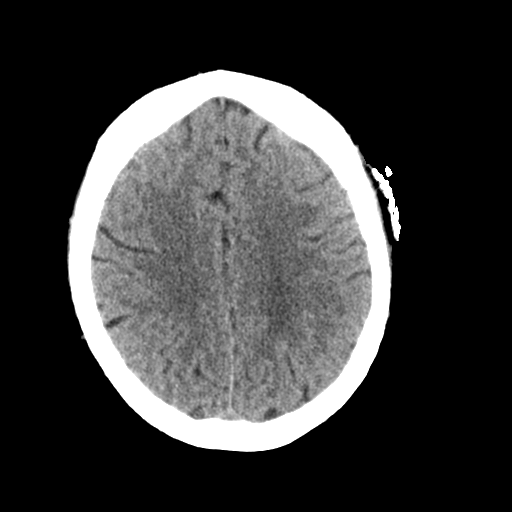
[im 20/32  bone]
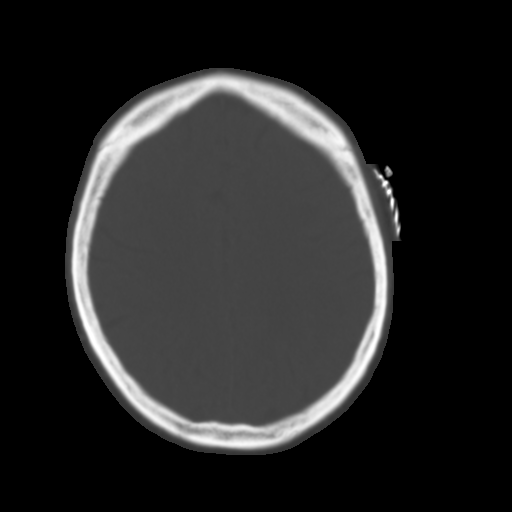
[im 24/32  brain]
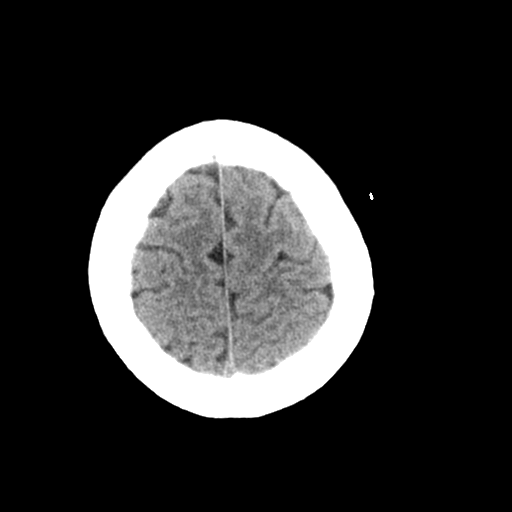
[im 28/32  brain]
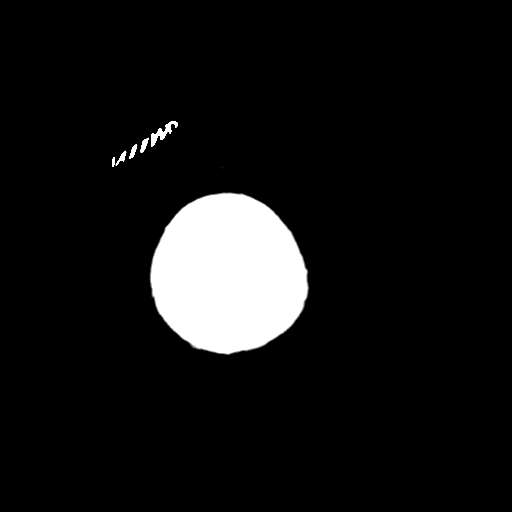

[Series 4: head 2.0 bone · axial · 0.41mm/px · z∈[-107,-75]mm · 3 of 80 slices shown]
[im 8/80  bone]
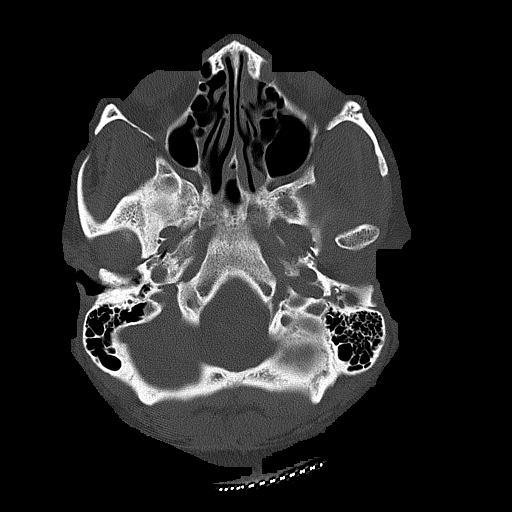
[im 16/80  bone]
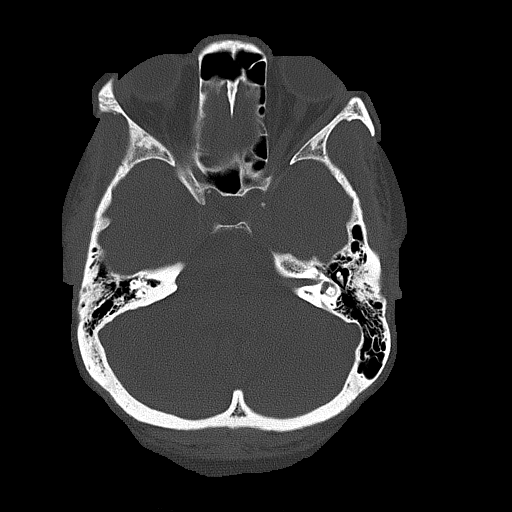
[im 24/80  bone]
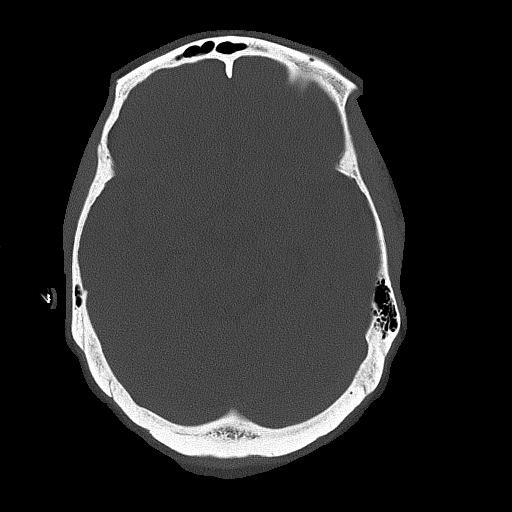

[Series 5: head 3.0 cor st · coronal · 0.32mm/px · 3 of 67 slices shown]
[im 23/67  brain]
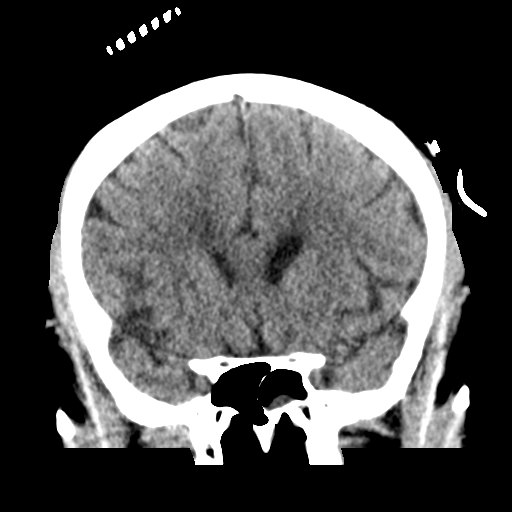
[im 30/67  brain]
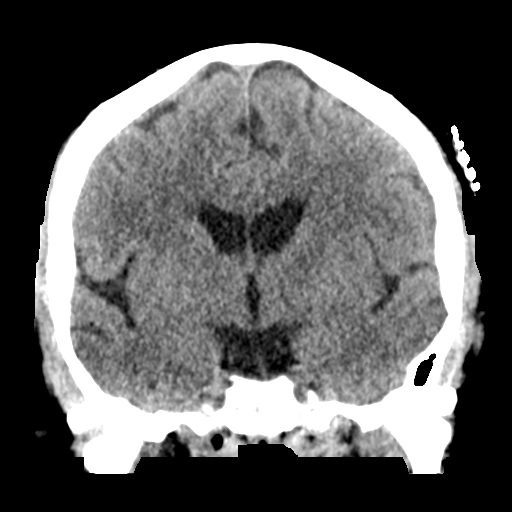
[im 37/67  brain]
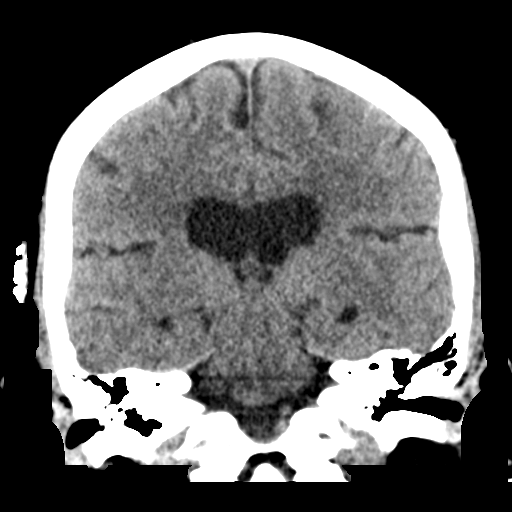

[Series 6: head 3.0 sag st · sagittal · 0.31mm/px · 3 of 59 slices shown]
[im 20/59  brain]
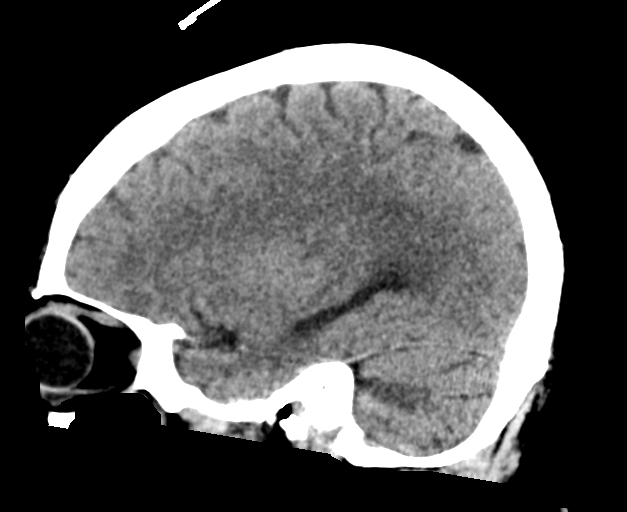
[im 30/59  brain]
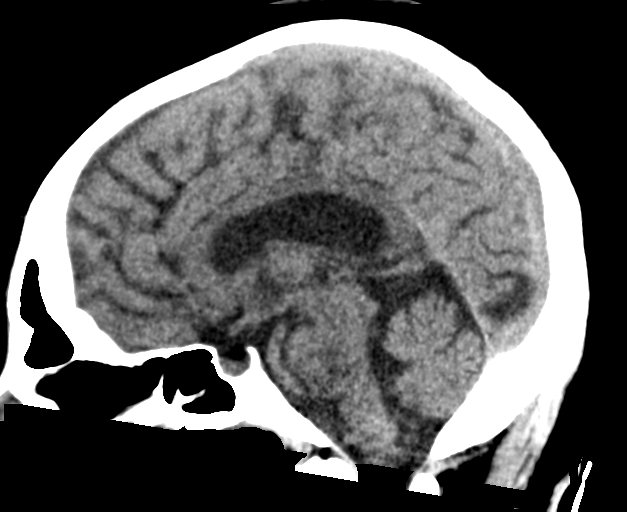
[im 39/59  brain]
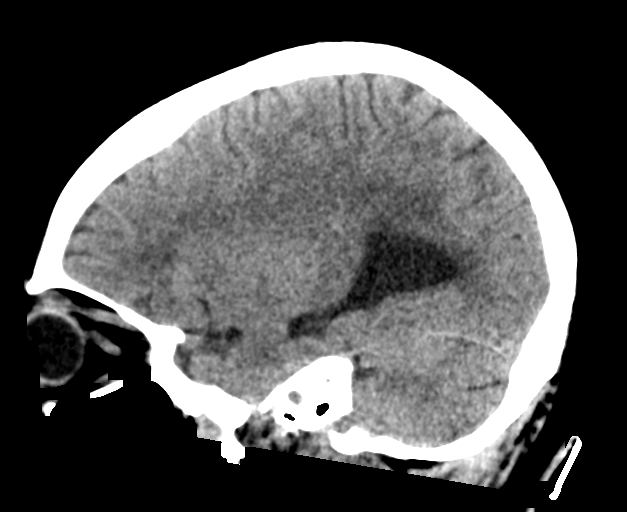

[16 of 47 positions shown; findings below may reference images not displayed]

FINDINGS: Brain: There is an old infarction affecting the right cerebellum,
middle cerebellar peduncle and right side of the pons. There is old
infarction affecting the right occipital cortical and subcortical
brain. No sign of acute infarction, mass lesion, hemorrhage,
hydrocephalus or extra-axial collection.

Vascular: No abnormal vascular finding.

Skull: Normal

Sinuses/Orbits: Mucosal thickening of the left division of the
sphenoid sinus. Old medial orbital blowout fracture on the right.

Other: None

ASPECTS (Alberta Stroke Program Early CT Score)

- Ganglionic level infarction (caudate, lentiform nuclei, internal
capsule, insula, M1-M3 cortex): 7

- Supraganglionic infarction (M4-M6 cortex): 3

Total score (0-10 with 10 being normal): 10
IMPRESSION: 1. No acute CT finding. Old infarction of the right pons, middle
cerebellar peduncle and cerebellum. Old infarction of the right
occipital cortical and subcortical brain.
2. ASPECTS is 10
3. These results were communicated to Dr. MPAPHIDZI at [DATE] on
[DATE] by text page via the [employer] messaging system.

## 2021-03-20 MED ORDER — LISINOPRIL 20 MG PO TABS
20.0000 mg | ORAL_TABLET | Freq: Once | ORAL | Status: AC
  Administered 2021-03-20: 20 mg via ORAL
  Filled 2021-03-20: qty 1

## 2021-03-20 MED ORDER — HYDRALAZINE HCL 25 MG PO TABS
50.0000 mg | ORAL_TABLET | Freq: Once | ORAL | Status: DC
  Filled 2021-03-20: qty 2

## 2021-03-20 MED ORDER — KETOROLAC TROMETHAMINE 30 MG/ML IJ SOLN
30.0000 mg | Freq: Once | INTRAMUSCULAR | Status: AC
  Administered 2021-03-20: 30 mg via INTRAVENOUS
  Filled 2021-03-20: qty 1

## 2021-03-20 MED ORDER — SODIUM CHLORIDE 0.9% FLUSH
3.0000 mL | Freq: Once | INTRAVENOUS | Status: AC
  Administered 2021-03-20: 3 mL via INTRAVENOUS

## 2021-03-20 MED ORDER — LISINOPRIL 10 MG PO TABS: 20.0000 mg | ORAL_TABLET | Freq: Every day | ORAL | 2 refills | Status: DC

## 2021-03-20 MED ORDER — HYDRALAZINE HCL 20 MG/ML IJ SOLN
20.0000 mg | Freq: Once | INTRAMUSCULAR | Status: AC
  Administered 2021-03-20: 20 mg via INTRAVENOUS
  Filled 2021-03-20: qty 1

## 2021-03-20 NOTE — ED Notes (Signed)
Sent down a 2nd UA and culture

## 2021-03-20 NOTE — ED Triage Notes (Signed)
Pt bib [facility] from home where she lives with her boyfriend. Boyfriend states he went to the store around 1515 and pt was fine and at her baseline. Once he got back from the store, he noticed her speech was slurred and she couldn't walk. Code stroke called EMS vitals: 190/80, 88HR, 92%RA, 218CBG

## 2021-03-20 NOTE — Consult Note (Signed)
Neurology consult   CC: code stroke.  History is obtained from: EMS.  HPI:  Kaitlyn Gray is a 45 yo female with a PMHx of Bipolar 1 disorder, HONK, DM II, HTN, homelessness, PNES, and stroke who presents today as a code stoke. Per EMS, her significant other saw 1515 hours then ran out to the store. When he came back about 20-30 mins later, he found Kaitlyn Gray to have "slurred speech" and a little confusion. She has had an old stroke and was left with some deficits of left sided weakness, but uses a cane to ambulate. No prior issues with speech or confusion, but 3/22 stroke notes states she was talking in a baby voice, which is what she is doing today.    After brief exam on the ED bridge and for airway clearance, patient was taken emergently to CT suite. CTH showed no acute finding. No TNK due to low suspicion for stroke. No suspicion for LVO, so patient taken straight to MRIb. MRI brain was negative for acute infarct, so code stroke cancelled.   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 In 08/2020, patient was here for a stroke. UDS was + at that time for Cocaine and THC. Diagnosis was recrudescence of left sided weakness  with possible conversion disorder. In 12/2019 per Dr. Phoebe Sharps progress note here,  "admitted for right-sided weakness, numbness and slurred speech.  MRI showed acute/subacute demyelination vs Subacute infarction R middle cerebral peduncle.  MRA showed Multifocal intracranial atherosclerotic disease vs vasculitis vs cocaine induced vasculopathy, including left ICA siphon moderate stenosis, left M2, right P2 and left P4 moderate to severe stenosis.  Carotid Doppler negative.  EF 55 to 60%.  LP w/ slightly elevated protein 69, WBC 4, negative oligoclonol bands.  UDS positive for cocaine.  A1c 7.5, LDL 122.  ANA positive.  Patient left AMA at that time.  Patient diagnosis most likely due to ischemic infarction."  09/07/20 "Neuro - awake, alert, eyes open, orientated to age, place  and people, but not sure about time. No aphasia, talks in a baby voice, mild dysarthria, fluent language, following all simple commands. Able to name and repeat. No gaze palsy, tracking bilaterally, visual field full, PERRL. No significant facial droop. Tongue midline. RUE and RLE 5/5, left UE 3/5 with pain and giveaway weakness, LLE 3-/5 with lack of effort on exam. Sensation decreased on the left subjectively. Right FTN intact, gait not tested."  LKW: 1515 hours.  TNK given?  Stroke ruled out by MRI IR Thrombectomy? No, no LVO.  MRS: 3  NIHSS:  1a Level of Consciousness: 0 1b LOC Questions: 0 1c LOC Commands: 0 2 Best Gaze: 0 3 Visual: 0 4 Facial Palsy: 0 5a Motor Arm - left: 1 5b Motor Arm - Right: 0 6a Motor Leg - Left: 1 6b Motor Leg - Right: 0 7 Limb Ataxia: 0 8 Sensory: 0 9 Best Language: 0 10 Dysarthria: 0 11 Extinction and Inattention: 0 TOTAL:  2  ROS: A robust ROS was unable to be performed due to emergent nature of event.   Past Medical History:  Diagnosis Date   Bipolar 1 disorder ([place])    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 ([place]) 07/19/2017   Hypertension    Stroke ([place])   OSA, Cocaine abuse, Tobacco abuse, HLD, DM II  Family History  Problem Relation Age of Onset   Hypertension Mother    CAD Mother 81  died of MI at age 17   Hypertension Father     Social History:  reports that she has been smoking cigarettes. She has been smoking an average of .5 packs per day. She has never used smokeless tobacco. She reports current drug use. Drugs: Marijuana and Cocaine. She reports that she does not drink alcohol.   Prior to Admission medications   Medication Sig Start Date End Date Taking? Authorizing Provider  aspirin 81 MG chewable tablet Chew 1 tablet (81 mg total) by mouth daily. 03/15/20   Carlisle Cater, PA-C  atorvastatin (LIPITOR) 80 MG tablet Take 1 tablet (80 mg total) by mouth daily. Patient not taking: Reported on  09/06/2020 03/15/20   Carlisle Cater, PA-C  blood glucose meter kit and supplies KIT Dispense based on patient and insurance preference. Use up to four times daily as directed. (FOR ICD-9 250.00, 250.01). Please give 3 months supply of lancets and test strips to allow her to check QID. 12/07/17   Roxan Hockey, MD  blood glucose meter kit and supplies Rel[employer] Prime or Dispense other brand based on patient and insurance preference. Use up to four times daily as directed. (FOR ICD-9 250.00, 250.01). 12/07/17   Roxan Hockey, MD  ferrous sulfate 325 (65 FE) MG tablet Take 1 tablet (325 mg total) by mouth daily. 01/20/20   Charlesetta Shanks, MD  gabapentin (NEURONTIN) 400 MG capsule Take 1 capsule (400 mg total) by mouth 2 (two) times daily. 09/13/20   Nita Sells, MD  hydrOXYzine (ATARAX/VISTARIL) 25 MG tablet Take 0.5 tablets (12.5 mg total) by mouth every 6 (six) hours as needed for anxiety or itching. 09/13/20   Nita Sells, MD  Insulin Pen Needle 31G X 5 MM MISC Use as directed 12/07/17   Roxan Hockey, MD  lisinopril (ZESTRIL) 10 MG tablet Take 1 tablet (10 mg total) by mouth daily. Patient not taking: Reported on 09/06/2020 01/20/20 03/15/29  Charlesetta Shanks, MD  metFORMIN (GLUCOPHAGE) 1000 MG tablet Take 1 tablet (1,000 mg total) by mouth 2 (two) times daily with a meal. Patient not taking: Reported on 09/06/2020 01/20/20   Charlesetta Shanks, MD  QUEtiapine (SEROQUEL) 200 MG tablet Take 200 mg by mouth at bedtime. 08/31/20   [provider]  SUBOXONE 8-2 MG FILM Place 1 Film under the tongue in the morning, at noon, and at bedtime. 09/13/20   Nita Sells, MD    Exam: Current vital signs: Wt 99.4 kg   BMI 38.82 kg/m   Physical Exam  Constitutional: Appears well-developed and well-nourished. Obese.  Psych: Affect appropriate to situation. Eyes: No scleral injection. HENT: No OP obstruction. Head: Normocephalic.  Cardiovascular: Normal rate and regular rhythm.   Respiratory: Effort normal.  GI: Abdomen soft.  No distension. There is no tenderness.  Skin: WDI.  Neuro: Mental Status: Patient is awake, alert, oriented to self, age, place, but not time.  Patient is unable to give a clear and coherent history. No signs of neglect. Speech/Language: Patient does not have dysarthria or aphasia,uses a "baby voice" intermittently and inconsistently slurs/stutters Cranial Nerves: II: Visual Fields are full. Pupils are equal, round, and reactive to light.  III,IV, VI: EOMI without ptosis or diploplia on casual observation although she does not follow all commands. Blinks to threat.   V: Facial sensation is symmetric to light touch in V1, V2, and V3. VII: Facial movement symmetrical. No facial droop.  VIII: hearing is intact to voice. X: Uvula elevates symmetrically. XI: Shoulder shrug is symmetric. XII: tongue is  midline without atrophy or fasciculations.  Motor: RUE: grips  5/5     biceps  5/5     triceps  5/5 LUE: Some effort against gravity.  Drifts when held up.  Effort is better outside of formal examination when moving spontaneously RLE: knee  5/5   thigh  5 /5    plantar flexion   5/5     dorsiflexion  5/5 LLE: knee  0/5     thigh   0/5      plantar flexion   0/5    dorsiflexion   0/5.  Again effort is seen when patient is not being formally examined but moving more casually Sensory: Patient reports loss of sensation on the left side but is able to react to stimuli on that side when not being formally tested Plantars: Toes are downgoing bilaterally.  Cerebellar: No ataxia noted with FNF and HKS bilaterally. Finger roll without ataxia.   I have reviewed labs in epic and the pertinent results are: INR  1        aPTT  27      MD reviewed the images obtained:  CTH: No acute CT finding. Old infarction of the right pons, middle cerebellar peduncle and cerebellum. Old infarction of the right occipital cortical and subcortical brain. -ASPECTS is  10  MRI brain: -No acute finding.  -Old infarctions affecting the right pons, middle cerebellar peduncle and right cerebellum. Old infarctions affecting the thalami. Old infarction in the right occipital lobe. Chronic small-vessel ischemic changes of the white matter.  Assessment: 45 yo female with stroke risk factors or non adherence with medications and treatment plans, old stroke, HTN, HLD, DM II, and Cocaine abuse. Her NIHSS was 2. She was giving little effort on the left. No TNK due to low suspicion for stroke with functional exam overlay. MRI negative for acute finding. 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 Adherence with medications is of concern and also her hx of Cocaine positive on multiple UDS here.   Code stroke recommendations: -MRI brain obtained to rule out acute intracranial process given her significant risk factors  Additional recommendations: -No further inpatient neurological work-up is needed, medical clearance per ED -Her A1c was high in 08/2020 as well as LDL.  -She needs to f/up with PCP or referred to someone.  -She needs psychiatry f/up given the significant functional overlay on her examination  Patient seen by Clance Boll, MSN, APN-BC, nurse practitioner and by MD. Note/plan to be edited by MD as needed.  Pager: [phone]  Attending Neurologist's note:  I personally saw this patient, gathering history, performing a full neurologic examination, reviewing relevant labs, personally reviewing relevant imaging including head CT and MRI brain, and formulated the assessment and plan, adding the note above for completeness and clarity to accurately reflect my thoughts  Lesleigh Noe MD-PhD [facility] [phone]   CRITICAL CARE Performed by: Lorenza Chick   Total critical care time: 35 minutes  Critical care time was exclusive of separately billable procedures and treating other patients.  Critical care was necessary to treat or prevent imminent or life-threatening deterioration, evaluation for acute neurological deficits with potential to treat with TNK or thrombectomy.  Critical care was time spent personally by me on the following activities: development of treatment plan with patient and/or surrogate as well as nursing,  discussions with consultants, evaluation of patient's response to treatment, examination of patient, obtaining history from patient or surrogate, ordering and performing treatments and interventions, ordering and review of laboratory studies, ordering and review of radiographic studies, pulse oximetry and re-evaluation of patient's condition.

## 2021-03-20 NOTE — Discharge Instructions (Signed)
Your workup and MRI scan today did NOT show signs of a new stroke.  We talked about your high blood pressure.  Very high blood pressure can cause your old stroke symptoms to come back.  You told me that you were not taking your blood pressure pills at home.  It is important that you take these medicines to keep your blood pressure under control.  High blood pressure can lead to heart attacks, kidney failure, and strokes.  For now, you should increase your lisinopril dose from 10 mg to 20 mg daily.   PLEASE TAKE THIS MEDICINE EVERY DAY.  [facility] to make an appointment with a new primary care provider if you do not have one.  Blood pressure should be managed by a PCP regularly as an outpatient.  If you are concerned about work disability, you will need to discuss this with a PCP.

## 2021-03-20 NOTE — ED Notes (Signed)
Pt found to be at the bottom of the stretcher yelling for Alinda Money. Pt told to slide herself back in the bed. Pt refusing to do swallow screen or answer questions at this time.  Pt refusing PO meds, MD made aware.

## 2021-03-20 NOTE — ED Provider Notes (Signed)
[place] EMERGENCY DEPARTMENT Provider Note   CSN: [id number] Arrival date & time: 03/20/21  1618     History CC: Slurred speech, slow speech   Kaitlyn Gray is a 45 y.o. female with history of prior strokes, hypertension and bipolar disorder, presenting to ED with concern for slurred speech.  EMS reports that the patient's fianc or partner had gone to the store and last seen the patient well around 315.  When he returned from the store the patient had slurred speech, slow speech, and appeared confused.  Code stroke was activated based on the slurred speech or confused speech.  The patient denied any substance use with EMS.  No medications were given by EMS.  No hypoglycemia with EMS check.  On arrival the patient is complaining of a mild headache and says her speech is slow.  She arrives as a CODE STROKE.  HPI     Past Medical History:  Diagnosis Date   Bipolar 1 disorder ([place])    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 ([place]) 07/19/2017   Hypertension    Stroke [facility])     Patient Active Problem List   Diagnosis Date Noted   CVA (cerebral vascular accident) ([place]) 09/06/2020   Left-sided weakness    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   Normocytic anemia 02/09/2020   Ischemic stroke ([place]) 01/01/2020   Acute encephalopathy 05/12/2018   Diabetes mellitus without complication ([place]) 03/55/9741   Hypokalemia 12/06/2017   Hypertension 12/06/2017   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     Family History  Problem Relation Age of Onset   Hypertension Mother    CAD Mother 56       died of MI at age 54   Hypertension Father     Social History   Tobacco Use   Smoking status: Every Day    Packs/day: 0.50    Types: Cigarettes   Smokeless tobacco: Never  Vaping Use   Vaping Use: Never used  Substance Use Topics   Alcohol use: No   Drug use: Yes    Types: Marijuana, Cocaine    Home Medications Prior to Admission medications   Medication Sig Start Date End Date Taking? Authorizing Provider  aspirin 81 MG chewable tablet Chew 1 tablet (81 mg total) by mouth daily. 03/15/20   Carlisle Cater, PA-C  atorvastatin (LIPITOR) 80 MG tablet Take 1 tablet (80 mg total) by mouth daily. Patient not taking: Reported on 09/06/2020 03/15/20   Carlisle Cater, PA-C  blood glucose meter kit and supplies KIT Dispense based on patient and insurance preference. Use up to four times daily as directed. (FOR ICD-9 250.00, 250.01). Please give 3 months supply of lancets and test strips to allow her to check QID. 12/07/17   Roxan Hockey, MD  blood glucose meter kit and supplies Relion Prime or Dispense other brand based on patient and insurance preference. Use up to four times daily as directed. (FOR ICD-9 250.00, 250.01). 12/07/17  Roxan Hockey, MD  ferrous sulfate 325 (65 FE) MG tablet Take 1 tablet (325 mg total) by mouth daily. 01/20/20   Charlesetta Shanks, MD  gabapentin (NEURONTIN) 400 MG capsule Take 1 capsule (400 mg total) by mouth 2 (two) times daily. 09/13/20   Nita Sells, MD  hydrOXYzine (ATARAX/VISTARIL) 25 MG tablet Take 0.5 tablets (12.5 mg total) by mouth every 6 (six) hours as needed for anxiety or itching. 09/13/20   Nita Sells, MD  Insulin Pen Needle 31G X 5 MM MISC Use as directed 12/07/17   Roxan Hockey, MD  lisinopril (ZESTRIL) 10 MG tablet Take 2 tablets (20 mg total) by mouth  daily. 03/20/21 04/19/21  Wyvonnia Dusky, MD  metFORMIN (GLUCOPHAGE) 1000 MG tablet Take 1 tablet (1,000 mg total) by mouth 2 (two) times daily with a meal. Patient not taking: Reported on 09/06/2020 01/20/20   Charlesetta Shanks, MD  QUEtiapine (SEROQUEL) 200 MG tablet Take 200 mg by mouth at bedtime. 08/31/20   [provider]  SUBOXONE 8-2 MG FILM Place 1 Film under the tongue in the morning, at noon, and at bedtime. 09/13/20   Nita Sells, MD    Allergies    Hydrocodone and Latex  Review of Systems   Review of Systems  Constitutional:  Negative for chills and fever.  Respiratory:  Negative for cough and shortness of breath.   Cardiovascular:  Negative for chest pain and palpitations.  Gastrointestinal:  Negative for abdominal pain and vomiting.  Musculoskeletal:  Negative for arthralgias and back pain.  Skin:  Negative for color change and rash.  Neurological:  Positive for speech difficulty, light-headedness and headaches. Negative for seizures, syncope, weakness and numbness.  All other systems reviewed and are negative.  Physical Exam Updated Vital Signs BP (!) 162/91   Pulse 96   Temp 98.1 F (36.7 C) (Oral)   Resp 16   Ht '5\' 3"'  (1.6 m)   Wt 92.5 kg   SpO2 100%   BMI 36.14 kg/m   Physical Exam Constitutional:      General: She is not in acute distress. HENT:     Head: Normocephalic and atraumatic.  Eyes:     General: No visual field deficit.    Conjunctiva/sclera: Conjunctivae normal.     Pupils: Pupils are equal, round, and reactive to light.  Cardiovascular:     Rate and Rhythm: Normal rate and regular rhythm.  Pulmonary:     Effort: Pulmonary effort is normal. No respiratory distress.  Abdominal:     General: There is no distension.     Tenderness: There is no abdominal tenderness.  Skin:    General: Skin is warm and dry.  Neurological:     Mental Status: She is alert.     GCS: GCS eye subscore is 4. GCS verbal subscore is 5. GCS motor  subscore is 6.     Cranial Nerves: No facial asymmetry.     Sensory: Sensation is intact.     Motor: Motor function is intact.     Comments: Slow speech, no slurring Motor function at baseline from prior CVA  per partner's report    ED Results / Procedures / Treatments   Labs (all labs ordered are listed, but only abnormal results are displayed) Labs Reviewed  CBC - Abnormal; Notable for the following components:      Result Value   Hemoglobin 9.1 (*)    HCT 31.0 (*)    MCV 79.3 (*)    MCH 23.3 (*)  MCHC 29.4 (*)    RDW 17.0 (*)    All other components within normal limits  COMPREHENSIVE METABOLIC PANEL - Abnormal; Notable for the following components:   Potassium 3.4 (*)    Glucose, Bld 185 (*)    Albumin 3.3 (*)    AST 14 (*)    All other components within normal limits  URINALYSIS, ROUTINE W REFLEX MICROSCOPIC - Abnormal; Notable for the following components:   Color, Urine STRAW (*)    pH 9.0 (*)    Bacteria, UA RARE (*)    All other components within normal limits  I-STAT CHEM 8, ED - Abnormal; Notable for the following components:   Glucose, Bld 182 (*)    Calcium, Ion 1.09 (*)    Hemoglobin 10.5 (*)    HCT 31.0 (*)    All other components within normal limits  CBG MONITORING, ED - Abnormal; Notable for the following components:   Glucose-Capillary 185 (*)    All other components within normal limits  RESP PANEL BY RT-PCR (FLU A&B, COVID) ARPGX2  PROTIME-INR  APTT  DIFFERENTIAL  RAPID URINE DRUG SCREEN, HOSP PERFORMED  ETHANOL  I-STAT BETA HCG BLOOD, ED ([facility], [facility], [facility] ONLY)    EKG EKG Interpretation  Date/Time:  Wednesday March 20 2021 17:06:44 EDT Ventricular Rate:  68 PR Interval:  155 QRS Duration: 100 QT Interval:  426 QTC Calculation: 093 R Axis:   47 Text Interpretation: Sinus rhythm Probable left atrial enlargement Confirmed by Octaviano Glow [phone]) on 03/20/2021 10:38:30 PM  Radiology MR BRAIN WO CONTRAST  Result Date:  03/20/2021 CLINICAL DATA:  Code stroke protocol.  Slurred speech. EXAM: MRI HEAD WITHOUT CONTRAST TECHNIQUE: Multiplanar, multiecho pulse sequences of the brain and surrounding structures were obtained without intravenous contrast. COMPARISON:  Head CT same day.  MRI 09/06/2020. FINDINGS: Brain: Diffusion imaging does not show any acute or subacute infarction. Old infarction is seen affecting the right pons, right middle cerebellar peduncle and right cerebellum. Old small vessel infarctions of the thalami and within the cerebral hemispheric white matter. Old infarction of the right occipital cortical and subcortical brain. Foci of hemosiderin deposition related to the old thalamic and posterior fossa insults. No evidence of mass lesion, destructive hydrocephalus or extra-axial collection. Vascular: Major vessels at the base of the brain show flow. Skull and upper cervical spine: Negative Sinuses/Orbits: Clear/normal Other: None IMPRESSION: No acute finding. Old infarctions affecting the right pons, middle cerebellar peduncle and right cerebellum. Old infarctions affecting the thalami. Old infarction in the right occipital lobe. Chronic small-vessel ischemic changes of the white matter. Electronically Signed   By: Nelson Chimes M.D.   On: 03/20/2021 17:00   CT HEAD CODE STROKE WO CONTRAST  Result Date: 03/20/2021 CLINICAL DATA:  Code stroke. Slurred speech. Last seen normal 1515 hours. EXAM: CT HEAD WITHOUT CONTRAST TECHNIQUE: Contiguous axial images were obtained from the base of the skull through the vertex without intravenous contrast. COMPARISON:  CT studies 09/06/2020 FINDINGS: Brain: There is an old infarction affecting the right cerebellum, middle cerebellar peduncle and right side of the pons. There is old infarction affecting the right occipital cortical and subcortical brain. No sign of acute infarction, mass lesion, hemorrhage, hydrocephalus or extra-axial collection. Vascular: No abnormal vascular  finding. Skull: Normal Sinuses/Orbits: Mucosal thickening of the left division of the sphenoid sinus. Old medial orbital blowout fracture on the right. Other: None ASPECTS ([place] Stroke Program Early CT Score) - Ganglionic level infarction (caudate, lentiform nuclei, internal capsule, insula,  M1-M3 cortex): 7 - Supraganglionic infarction (M4-M6 cortex): 3 Total score (0-10 with 10 being normal): 10 IMPRESSION: 1. No acute CT finding. Old infarction of the right pons, middle cerebellar peduncle and cerebellum. Old infarction of the right occipital cortical and subcortical brain. 2. ASPECTS is 10 3. These results were communicated to Dr. Curly Shores at 4:36 pm on 03/20/2021 by text page via the [employer] messaging system. Electronically Signed   By: Nelson Chimes M.D.   On: 03/20/2021 16:37    Procedures Procedures   Medications Ordered in ED Medications  sodium chloride flush (NS) 0.9 % injection 3 mL (3 mLs Intravenous Given 03/20/21 1803)  hydrALAZINE (APRESOLINE) injection 20 mg (20 mg Intravenous Given 03/20/21 1758)  ketorolac (TORADOL) 30 MG/ML injection 30 mg (30 mg Intravenous Given 03/20/21 2056)  lisinopril (ZESTRIL) tablet 20 mg (20 mg Oral Given 03/20/21 2056)    ED Course  I have reviewed the triage vital signs and the nursing notes.  Pertinent labs & imaging results that were available during my care of the patient were reviewed by me and considered in my medical decision making (see chart for details).  Code stroke slow speech, LKW 315 pm Pt seen by myself and neurology team on arrival, taken to CT.  Subsequently she was taken to MRI for more advanced imaging.  She does not have evidence of large vessel obstruction per Lucianne Lei score (no true aphasia, speech is slow).   Labs reviewed and are largely unremarkable, levels are baseline per my interpretation.  We also reviewed the patient's EKG which shows a sinus rhythm no acute ischemic findings per my interpretation.   Clinical Course as of  03/20/21 2300  Wed Mar 20, 2021  1656 Neurology cancelled code stroke, no acute infarct noted on MRI [MT]  1657 Per Dr Lyn Records recommendations: " Diffusion negative MRI, suspect recrudescence in the setting of hypertension w/ functional overlay on exam. Toxic metabolic workup including UA, UDS to rule out other etiologies. Outpatient neurology follow-up. Outpatient psychiatry referral." [MT]  6789 Pt refusing PO meds, states she doesn't like pills, IV hydralazine ordered instead.  Her partner Nicole Kindred is now present at bedside.  He tells me that the patient has chronic slow speech and left-sided weakness following her prior strokes.  Her speech just seemed more "off" today.  Patient does not appear to be fully comprehending what we are saying, although this appears to be her baseline mental status per his report.  I explained to Nicole Kindred that she may be experiencing recrudescence of her stroke symptoms in the setting of untreated hypertension or infection including urine infection.  He agrees with the plan for the work-up and for blood pressure control.  The patient also agrees for an IV injection of hydralazine that she does not want the p.o. meds [MT]  2020 My reassessment the patient mental status appears to have improved, she is less emotionally labile.  She is still hypertensive her blood pressure is also improving.  I was able to explain to her the dangers of continued high blood pressure.  It appears she has not been taking her lisinopril at home she does not like taking pills.  She understands the need for it now.  I have ordered her some lisinopril, will need to increase her dose to 20 mg.  She says she needs a new PCP and I provided a referral to the wellness clinic [MT]  2209 Urinalysis appears to been lost.  Patient is wanting to leave.  We will  send another urine sample, but I think she is reasonably safe for discharge [MT]    Clinical Course User Index [MT] Calixto Pavel, Carola Rhine, MD    Final Clinical  Impression(s) / ED Diagnoses Final diagnoses:  Hypertension, unspecified type  Slow rate of speech    Rx / DC Orders ED Discharge Orders          Ordered    lisinopril (ZESTRIL) 10 MG tablet  Daily        03/20/21 2025             Wyvonnia Dusky, MD 03/20/21 2301

## 2021-06-26 ENCOUNTER — Encounter (HOSPITAL_COMMUNITY): Payer: Self-pay

## 2021-06-26 ENCOUNTER — Emergency Department (HOSPITAL_COMMUNITY)
Admission: EM | Admit: 2021-06-26 | Discharge: 2021-06-26 | Disposition: A | Discharge status: Home / Self Care | Payer: Medicaid Other | Attending: Emergency Medicine | Admitting: Emergency Medicine

## 2021-06-26 ENCOUNTER — Emergency Department (HOSPITAL_COMMUNITY): Payer: Medicaid Other

## 2021-06-26 DIAGNOSIS — Z7982 Long term (current) use of aspirin: Secondary | ICD-10-CM | POA: Diagnosis not present

## 2021-06-26 DIAGNOSIS — K5900 Constipation, unspecified: Secondary | ICD-10-CM | POA: Insufficient documentation

## 2021-06-26 MED ORDER — FLEET ENEMA 7-19 GM/118ML RE ENEM
1.0000 | ENEMA | Freq: Once | RECTAL | Status: AC
  Administered 2021-06-26: 1 via RECTAL
  Filled 2021-06-26: qty 1

## 2021-06-26 NOTE — ED Notes (Signed)
Patient requested to hold off on the blood draw at this time.

## 2021-06-26 NOTE — ED Triage Notes (Addendum)
Pt arrived via EMS, from home, c/o constipation x2 days and not having medications. Stopped pain medication by providers last week due to misuse.

## 2021-06-26 NOTE — ED Provider Notes (Signed)
Patient signed out to me is pending a fleets enema.  After enema she was able to have a large bowel movement.  Subsequently symptoms have improved.  She states she never had abdominal pain but felt bloated which is now improved.  Patient discharged home in stable condition given instructions to take MiraLAX daily.  Advised outpatient follow-up with her doctor within the week.  Advised return if she has fevers pain or any additional concerns.   Luna Fuse, MD 06/26/21 [phone]

## 2021-06-26 NOTE — Discharge Instructions (Signed)
Call your primary care doctor or specialist as discussed in the next 2-3 days.   Return immediately back to the ER if:  Your symptoms worsen within the next 12-24 hours. You develop new symptoms such as new fevers, persistent vomiting, new pain, shortness of breath, or new weakness or numbness, or if you have any other concerns.  

## 2021-06-26 NOTE — ED Notes (Signed)
Attempted to collect a urine sample. Pt reports she had just gone to the bathroom. Will re-attempt at a later time.

## 2021-06-26 NOTE — ED Provider Notes (Signed)
[place] DEPT Provider Note   CSN: [id number] Arrival date & time: 06/26/21  1158     History  Chief Complaint  Patient presents with   Constipation    Fredricka MAGUIRE KILLMER is a 46 y.o. female.  46 year old female with prior medical history as detailed below presents for evaluation.  Patient complains of constipation.  She reports minimal to no BM for 2 days.  Patient denies abdominal pain.  She denies nausea or vomiting.  She reports that she is no longer taking Suboxone for the last 2 weeks.  She feels that not taking Suboxone may be related to her constipation.  She denies prior history of constipation.  She did not take anything at home for her symptoms.  The history is provided by the patient.  Constipation Severity:  Moderate Time since last bowel movement:  2 days Timing:  Constant Progression:  Waxing and waning Chronicity:  New Stool description:  None produced     Home Medications Prior to Admission medications   Medication Sig Start Date End Date Taking? Authorizing Provider  aspirin 81 MG chewable tablet Chew 1 tablet (81 mg total) by mouth daily. 03/15/20   Carlisle Cater, PA-C  atorvastatin (LIPITOR) 80 MG tablet Take 1 tablet (80 mg total) by mouth daily. Patient not taking: Reported on 09/06/2020 03/15/20   Carlisle Cater, PA-C  blood glucose meter kit and supplies KIT Dispense based on patient and insurance preference. Use up to four times daily as directed. (FOR ICD-9 250.00, 250.01). Please give 3 months supply of lancets and test strips to allow her to check QID. 12/07/17   Roxan Hockey, MD  blood glucose meter kit and supplies Relion Prime or Dispense other brand based on patient and insurance preference. Use up to four times daily as directed. (FOR ICD-9 250.00, 250.01). 12/07/17   Roxan Hockey, MD  ferrous sulfate 325 (65 FE) MG tablet Take 1 tablet (325 mg total) by mouth daily. 01/20/20   Charlesetta Shanks, MD  gabapentin  (NEURONTIN) 400 MG capsule Take 1 capsule (400 mg total) by mouth 2 (two) times daily. 09/13/20   Nita Sells, MD  hydrOXYzine (ATARAX/VISTARIL) 25 MG tablet Take 0.5 tablets (12.5 mg total) by mouth every 6 (six) hours as needed for anxiety or itching. 09/13/20   Nita Sells, MD  Insulin Pen Needle 31G X 5 MM MISC Use as directed 12/07/17   Roxan Hockey, MD  lisinopril (ZESTRIL) 10 MG tablet Take 2 tablets (20 mg total) by mouth daily. 03/20/21 04/19/21  Wyvonnia Dusky, MD  metFORMIN (GLUCOPHAGE) 1000 MG tablet Take 1 tablet (1,000 mg total) by mouth 2 (two) times daily with a meal. Patient not taking: Reported on 09/06/2020 01/20/20   Charlesetta Shanks, MD  QUEtiapine (SEROQUEL) 200 MG tablet Take 200 mg by mouth at bedtime. 08/31/20   [provider]  SUBOXONE 8-2 MG FILM Place 1 Film under the tongue in the morning, at noon, and at bedtime. 09/13/20   Nita Sells, MD      Allergies    Hydrocodone and Latex    Review of Systems   Review of Systems  Gastrointestinal:  Positive for constipation.  All other systems reviewed and are negative.  Physical Exam Updated Vital Signs BP (!) 185/93 (BP Location: Right Arm)    Pulse 66    Temp 97.9 F (36.6 C) (Oral)    Resp 16    Ht '5\' 4"'  (1.626 m)    Wt 97.5 kg  SpO2 97%    BMI 36.90 kg/m  Physical Exam Vitals and nursing note reviewed.  Constitutional:      General: She is not in acute distress.    Appearance: Normal appearance. She is well-developed.  HENT:     Head: Normocephalic and atraumatic.  Eyes:     Conjunctiva/sclera: Conjunctivae normal.     Pupils: Pupils are equal, round, and reactive to light.  Cardiovascular:     Rate and Rhythm: Normal rate and regular rhythm.     Heart sounds: Normal heart sounds.  Pulmonary:     Effort: Pulmonary effort is normal. No respiratory distress.     Breath sounds: Normal breath sounds.  Abdominal:     General: There is no distension.     Palpations:  Abdomen is soft.     Tenderness: There is no abdominal tenderness.  Musculoskeletal:        General: No deformity. Normal range of motion.     Cervical back: Normal range of motion and neck supple.  Skin:    General: Skin is warm and dry.  Neurological:     General: No focal deficit present.     Mental Status: She is alert and oriented to person, place, and time.    ED Results / Procedures / Treatments   Labs (all labs ordered are listed, but only abnormal results are displayed) Labs Reviewed  CBC WITH DIFFERENTIAL/PLATELET  COMPREHENSIVE METABOLIC PANEL  LIPASE, BLOOD  URINALYSIS, ROUTINE W REFLEX MICROSCOPIC  I-STAT BETA HCG BLOOD, ED ([facility], [facility], [facility] ONLY)    EKG None  Radiology DG Abd 2 Views  Result Date: 06/26/2021 CLINICAL DATA:  Constipation. EXAM: ABDOMEN - 2 VIEW COMPARISON:  None. FINDINGS: The bowel gas pattern is normal. Moderate amount of retained colorectal stool suggesting constipation. There is no evidence of free air. No radio-opaque calculi or other significant radiographic abnormality is seen. IMPRESSION: Moderate amount of retained colorectal suggesting constipation. Electronically Signed   By: Keane Police D.O.   On: 06/26/2021 12:58    Procedures Procedures    Medications Ordered in ED Medications - No data to display  ED Course/ Medical Decision Making/ A&P                           Medical Decision Making   Medical Screen Complete  This patient presented to the ED with complaint of constipation.  This complaint involves an extensive number of treatment options. The initial differential diagnosis includes, but is not limited to, constipation, metabolic abnormality, other intra-abdominal pathology  This presentation is: Acute, Self-Limited, Previously Undiagnosed, Uncertain Prognosis, Complicated, Systemic Symptoms, and Threat to Life/Bodily Function  Patient is presenting with complaint of constipation.  She reports minimal to no BM for the last  2 days.  She is requesting enema or other treatment for constipation.  Screening labs ordered given patient's significant comorbidities.  Plain film imaging of the abdomen shows significant stool burden.  Will trial enema and see if she has improvement.  Pending labs and reevaluation signed over to Dr. Almyra Free.    Co morbidities that complicated the patient's evaluation  Prior CVA, hypertension, bipolar, diabetes, substance abuse   Additional history obtained:   External records from outside sources obtained and reviewed including prior ED visits and prior Inpatient records.    Lab Tests:  I ordered and personally interpreted labs.  The pertinent results include: CBC, CMP, lipase, UA and hCG ordered.   Imaging Studies  ordered:  I ordered imaging studies including normal x-ray I independently visualized and interpreted obtained imaging which showed significant stool burden I agree with the radiologist interpretation.   Cardiac Monitoring:  The patient was maintained on a cardiac monitor.  I personally viewed and interpreted the cardiac monitor which showed an underlying rhythm of: Sinus rhythm   Medicines ordered:  I ordered medication including fleets enema for this patient    Test Considered:   CT abdomen pelvis -screening labs pending at this time.  Patient's initial abdominal exam is benign.  Patient's complaint is most consistent with likely constipation.  CT imaging does not appear to be warranted if screening labs are without significant abnormality.   Problem List / ED Course:  Constipation             Final Clinical Impression(s) / ED Diagnoses Final diagnoses:  Constipation, unspecified constipation type    Rx / DC Orders ED Discharge Orders     None         Valarie Merino, MD 06/27/21 1448

## 2021-06-26 NOTE — ED Provider Triage Note (Signed)
Emergency Medicine Provider Triage Evaluation Note  Kaitlyn Gray , a 46 y.o. female  was evaluated in triage.  Pt complains of the patient.  Movement for 3 days.  She normally has 1 every day.  She is experiencing some left lower quadrant pain as well.  She says this is constant and worse with movements.  She did endorse having 1 episode of emesis yesterday afternoon, however has not had any other episodes since then.  She has been had no fevers or diarrhea.  Review of Systems  Positive: Constipation, abd pain, vomiting Negative:   Physical Exam  BP (!) 185/93 (BP Location: Right Arm)    Pulse 66    Temp 97.9 F (36.6 C) (Oral)    Resp 16    Ht 5\' 4"  (1.626 m)    Wt 97.5 kg    SpO2 97%    BMI 36.90 kg/m  Gen:   Awake, no distress   Resp:  Normal effort  MSK:   Moves extremities without difficulty  Other:  Abdomen is soft.  Mild tenderness to the left umbilical and lower quadrant area.  No Guarding or rigidity  Medical Decision Making  Medically screening exam initiated at 12:18 PM.  Appropriate orders placed.  BENTLEE DRIER was informed that the remainder of the evaluation will be completed by another provider, this initial triage assessment does not replace that evaluation, and the importance of remaining in the ED until their evaluation is complete.  Likely constipation. Plain abd films and abd labs ordered.    [name], PA-C 06/26/21 1220

## 2021-10-16 ENCOUNTER — Emergency Department (HOSPITAL_COMMUNITY): Payer: Medicaid Other

## 2021-10-16 ENCOUNTER — Emergency Department (HOSPITAL_COMMUNITY)
Admission: EM | Admit: 2021-10-16 | Discharge: 2021-10-16 | Disposition: A | Discharge status: Home / Self Care | Payer: Medicaid Other | Attending: Emergency Medicine | Admitting: Emergency Medicine

## 2021-10-16 DIAGNOSIS — I10 Essential (primary) hypertension: Secondary | ICD-10-CM | POA: Insufficient documentation

## 2021-10-16 DIAGNOSIS — E119 Type 2 diabetes mellitus without complications: Secondary | ICD-10-CM | POA: Diagnosis not present

## 2021-10-16 DIAGNOSIS — R29810 Facial weakness: Secondary | ICD-10-CM | POA: Insufficient documentation

## 2021-10-16 DIAGNOSIS — Z7982 Long term (current) use of aspirin: Secondary | ICD-10-CM | POA: Insufficient documentation

## 2021-10-16 DIAGNOSIS — G8194 Hemiplegia, unspecified affecting left nondominant side: Secondary | ICD-10-CM | POA: Insufficient documentation

## 2021-10-16 DIAGNOSIS — R531 Weakness: Secondary | ICD-10-CM | POA: Diagnosis not present

## 2021-10-16 DIAGNOSIS — Z8673 Personal history of transient ischemic attack (TIA), and cerebral infarction without residual deficits: Secondary | ICD-10-CM

## 2021-10-16 DIAGNOSIS — Z794 Long term (current) use of insulin: Secondary | ICD-10-CM | POA: Insufficient documentation

## 2021-10-16 DIAGNOSIS — Z20822 Contact with and (suspected) exposure to covid-19: Secondary | ICD-10-CM | POA: Insufficient documentation

## 2021-10-16 DIAGNOSIS — Z7984 Long term (current) use of oral hypoglycemic drugs: Secondary | ICD-10-CM | POA: Insufficient documentation

## 2021-10-16 DIAGNOSIS — D649 Anemia, unspecified: Secondary | ICD-10-CM | POA: Diagnosis not present

## 2021-10-16 DIAGNOSIS — Z79899 Other long term (current) drug therapy: Secondary | ICD-10-CM | POA: Diagnosis not present

## 2021-10-16 DIAGNOSIS — Z91040 Latex allergy status: Secondary | ICD-10-CM | POA: Diagnosis not present

## 2021-10-16 DIAGNOSIS — Z5900 Homelessness unspecified: Secondary | ICD-10-CM | POA: Insufficient documentation

## 2021-10-16 DIAGNOSIS — R4781 Slurred speech: Secondary | ICD-10-CM | POA: Insufficient documentation

## 2021-10-16 LAB — DIFFERENTIAL
Abs Immature Granulocytes: 0.01 10*3/uL (ref 0.00–0.07)
Basophils Absolute: 0 10*3/uL (ref 0.0–0.1)
Basophils Relative: 1 %
Eosinophils Absolute: 0.1 10*3/uL (ref 0.0–0.5)
Eosinophils Relative: 1 %
Immature Granulocytes: 0 %
Lymphocytes Relative: 31 %
Lymphs Abs: 1.8 10*3/uL (ref 0.7–4.0)
Monocytes Absolute: 0.5 10*3/uL (ref 0.1–1.0)
Monocytes Relative: 9 %
Neutro Abs: 3.4 10*3/uL (ref 1.7–7.7)
Neutrophils Relative %: 58 %

## 2021-10-16 LAB — IRON AND TIBC
Iron: 31 ug/dL (ref 28–170)
Saturation Ratios: 9 % — ABNORMAL LOW (ref 10.4–31.8)
TIBC: 343 ug/dL (ref 250–450)
UIBC: 312 ug/dL

## 2021-10-16 LAB — CBC
HCT: 28.9 % — ABNORMAL LOW (ref 36.0–46.0)
Hemoglobin: 8.4 g/dL — ABNORMAL LOW (ref 12.0–15.0)
MCH: 21.9 pg — ABNORMAL LOW (ref 26.0–34.0)
MCHC: 29.1 g/dL — ABNORMAL LOW (ref 30.0–36.0)
MCV: 75.5 fL — ABNORMAL LOW (ref 80.0–100.0)
Platelets: 302 10*3/uL (ref 150–400)
RBC: 3.83 MIL/uL — ABNORMAL LOW (ref 3.87–5.11)
RDW: 17.9 % — ABNORMAL HIGH (ref 11.5–15.5)
WBC: 5.8 10*3/uL (ref 4.0–10.5)
nRBC: 0 % (ref 0.0–0.2)

## 2021-10-16 LAB — RETICULOCYTES
Immature Retic Fract: 11.4 % (ref 2.3–15.9)
RBC.: 3.45 MIL/uL — ABNORMAL LOW (ref 3.87–5.11)
Retic Count, Absolute: 43.5 10*3/uL (ref 19.0–186.0)
Retic Ct Pct: 1.3 % (ref 0.4–3.1)

## 2021-10-16 LAB — APTT: aPTT: 20 seconds — ABNORMAL LOW (ref 24–36)

## 2021-10-16 LAB — COMPREHENSIVE METABOLIC PANEL
ALT: 17 U/L (ref 0–44)
AST: 21 U/L (ref 15–41)
Albumin: 3.3 g/dL — ABNORMAL LOW (ref 3.5–5.0)
Alkaline Phosphatase: 66 U/L (ref 38–126)
Anion gap: 8 (ref 5–15)
BUN: 8 mg/dL (ref 6–20)
CO2: 26 mmol/L (ref 22–32)
Calcium: 8.9 mg/dL (ref 8.9–10.3)
Chloride: 107 mmol/L (ref 98–111)
Creatinine, Ser: 0.88 mg/dL (ref 0.44–1.00)
GFR, Estimated: >60 mL/min (ref >60)
Glucose, Bld: 136 mg/dL — ABNORMAL HIGH (ref 70–99)
Potassium: 3.6 mmol/L (ref 3.5–5.1)
Sodium: 141 mmol/L (ref 135–145)
Total Bilirubin: 0.7 mg/dL (ref 0.3–1.2)
Total Protein: 6.7 g/dL (ref 6.5–8.1)

## 2021-10-16 LAB — I-STAT CHEM 8, ED
BUN: 10 mg/dL (ref 6–20)
Calcium, Ion: 1.1 mmol/L — ABNORMAL LOW (ref 1.15–1.40)
Chloride: 106 mmol/L (ref 98–111)
Creatinine, Ser: 0.9 mg/dL (ref 0.44–1.00)
Glucose, Bld: 133 mg/dL — ABNORMAL HIGH (ref 70–99)
HCT: 29 % — ABNORMAL LOW (ref 36.0–46.0)
Hemoglobin: 9.9 g/dL — ABNORMAL LOW (ref 12.0–15.0)
Potassium: 3.9 mmol/L (ref 3.5–5.1)
Sodium: 141 mmol/L (ref 135–145)
TCO2: 27 mmol/L (ref 22–32)

## 2021-10-16 LAB — RESP PANEL BY RT-PCR (FLU A&B, COVID) ARPGX2
Influenza A by PCR: NEGATIVE
Influenza B by PCR: NEGATIVE
SARS Coronavirus 2 by RT PCR: NEGATIVE

## 2021-10-16 LAB — FERRITIN: Ferritin: 5 ng/mL — ABNORMAL LOW (ref 11–307)

## 2021-10-16 LAB — VITAMIN B12: Vitamin B-12: 253 pg/mL (ref 180–914)

## 2021-10-16 LAB — I-STAT BETA HCG BLOOD, ED (MC, WL, AP ONLY): I-stat hCG, quantitative: <5 m[IU]/mL (ref <5)

## 2021-10-16 LAB — PROTIME-INR
INR: 1 (ref 0.8–1.2)
Prothrombin Time: 13.5 seconds (ref 11.4–15.2)

## 2021-10-16 LAB — ETHANOL: Alcohol, Ethyl (B): <10 mg/dL (ref <10)

## 2021-10-16 LAB — CBG MONITORING, ED: Glucose-Capillary: 134 mg/dL — ABNORMAL HIGH (ref 70–99)

## 2021-10-16 LAB — FOLATE: Folate: 16.9 ng/mL (ref >5.9)

## 2021-10-16 LAB — MAGNESIUM: Magnesium: 1.7 mg/dL (ref 1.7–2.4)

## 2021-10-16 IMAGING — CT CT HEAD CODE STROKE
2 of 4 series · 15 of 37 positions shown, 18 images · non-contrast
Comparison: [DATE]

CLINICAL DATA: Code stroke.



[Series 4: head 2.0 h70h · axial · 0.47mm/px · z∈[-115,+25]mm · 12 of 80 slices shown, 15 images]
[im 5/80  brain]
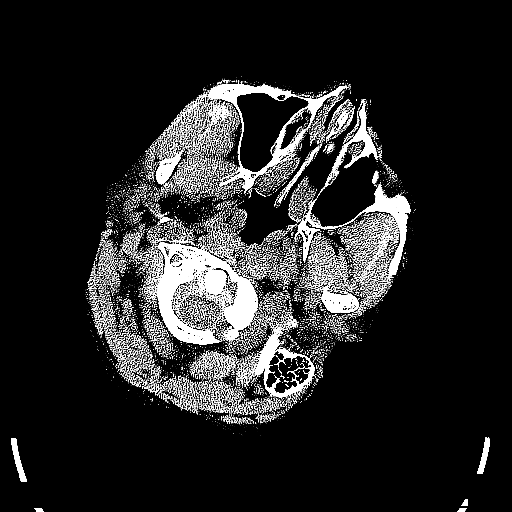
[im 5/80  bone]
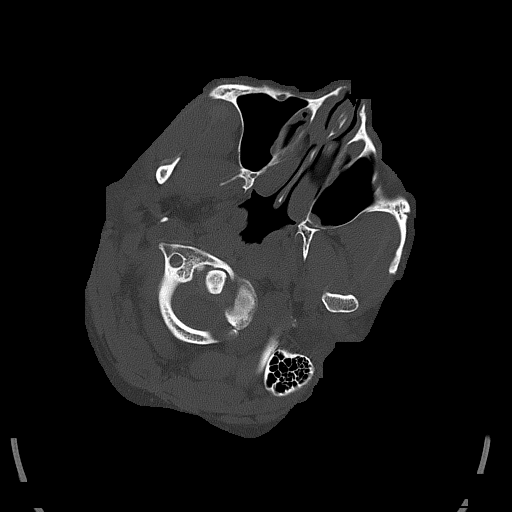
[im 10/80  brain]
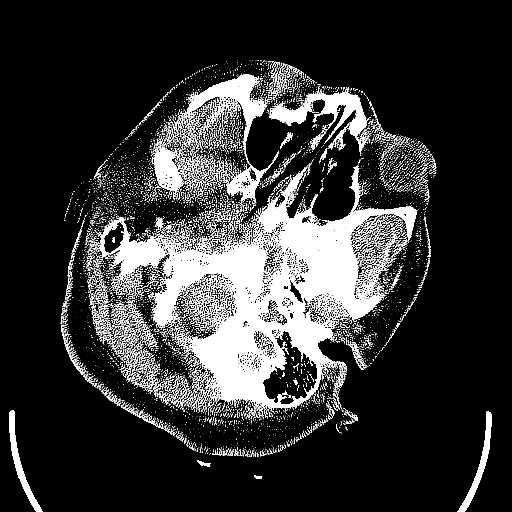
[im 20/80  brain]
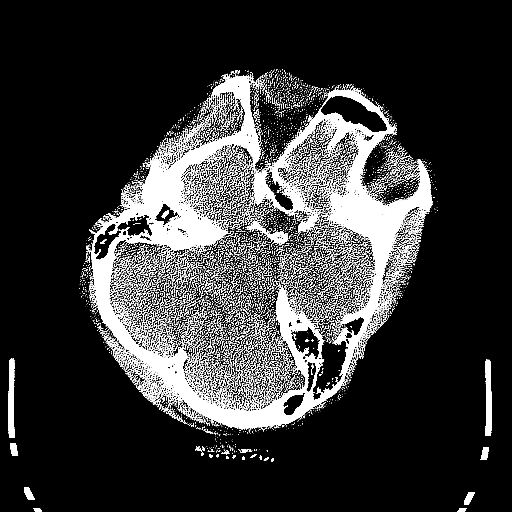
[im 25/80  brain]
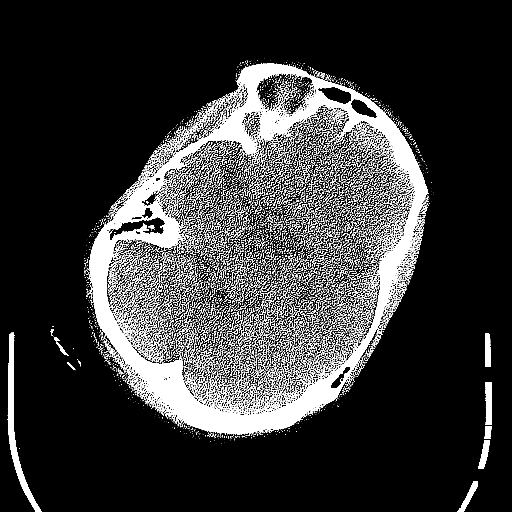
[im 30/80  brain]
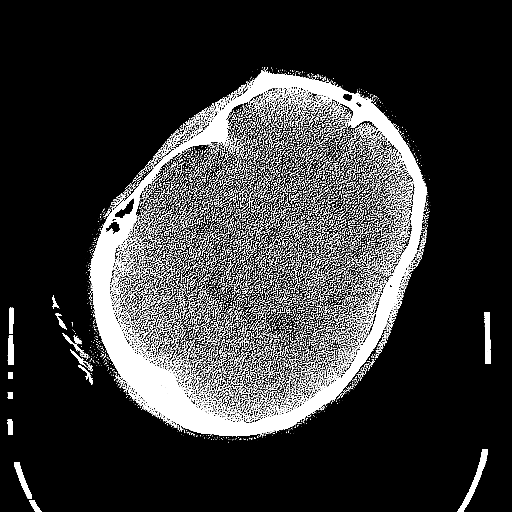
[im 30/80  bone]
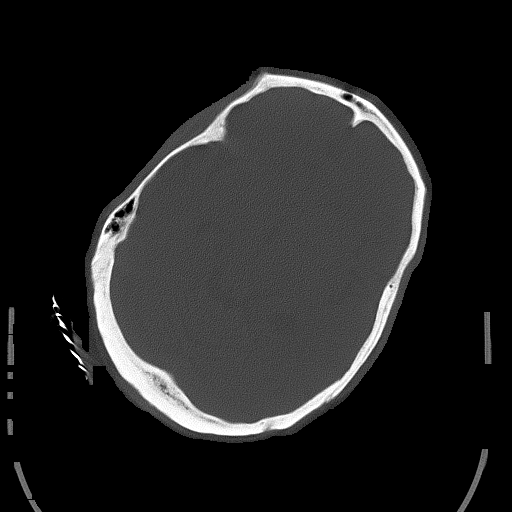
[im 35/80  brain]
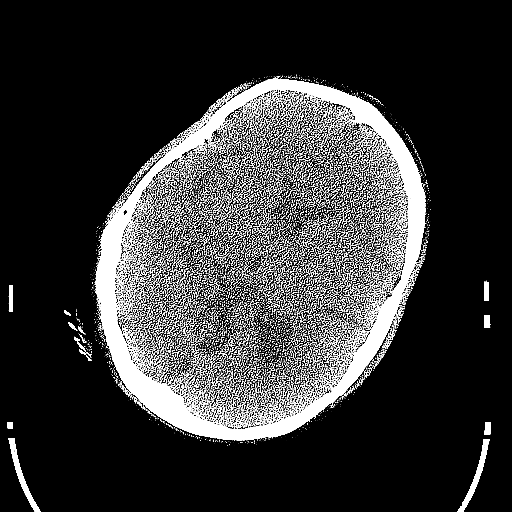
[im 45/80  brain]
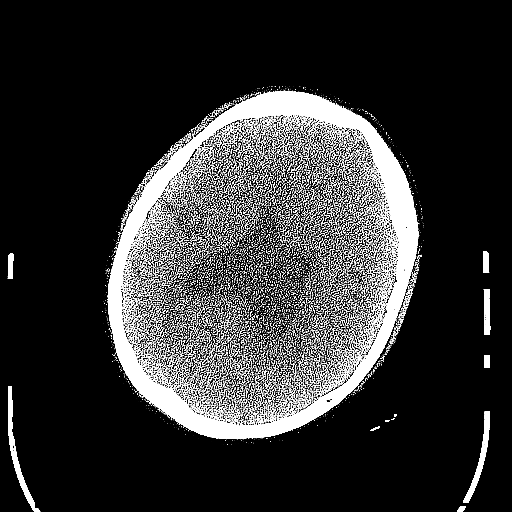
[im 50/80  brain]
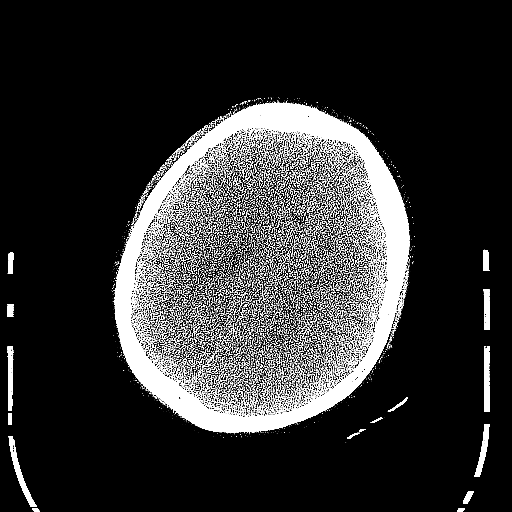
[im 55/80  brain]
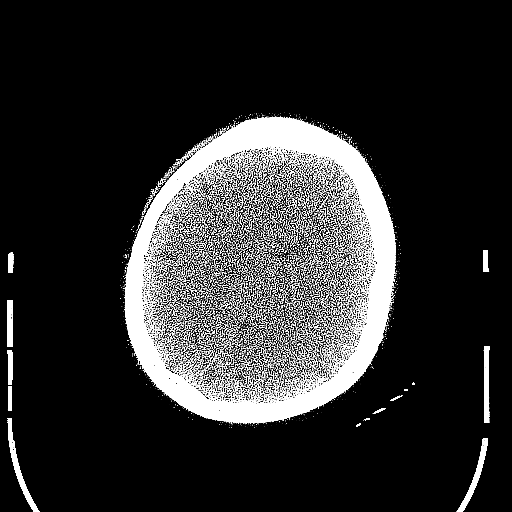
[im 55/80  bone]
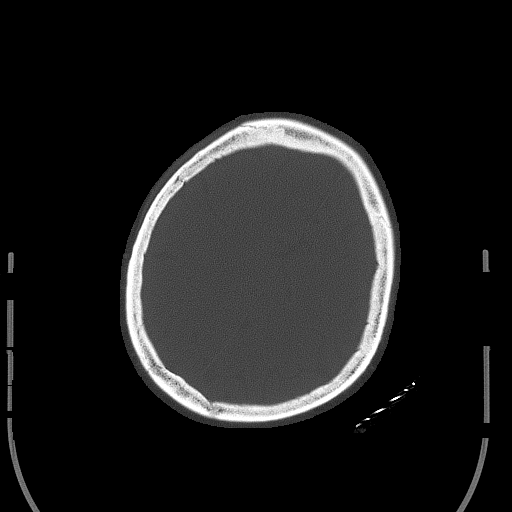
[im 60/80  brain]
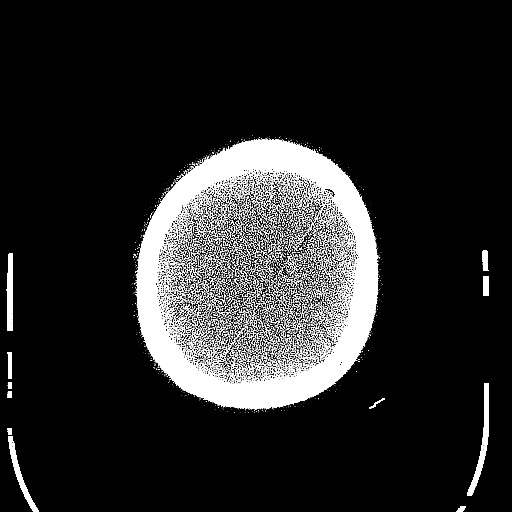
[im 70/80  brain]
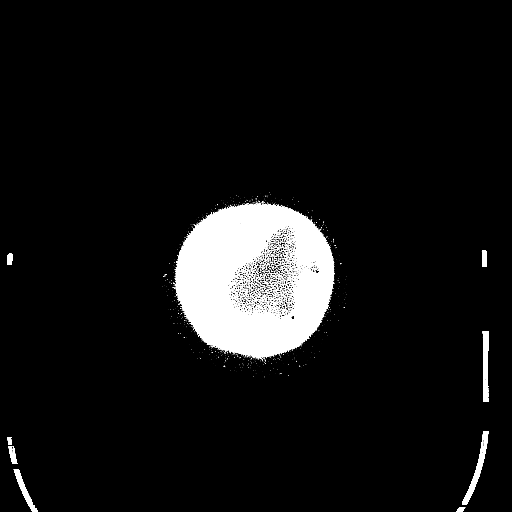
[im 75/80  brain]
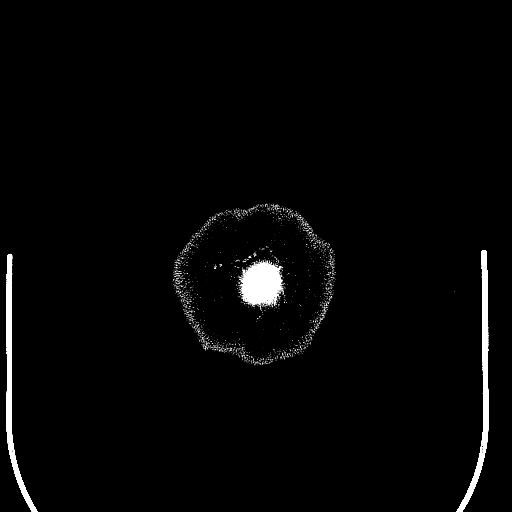

[Series 6: head 3.0 mpr sag · sagittal · 0.31mm/px · 3 of 67 slices shown]
[im 23/67  brain]
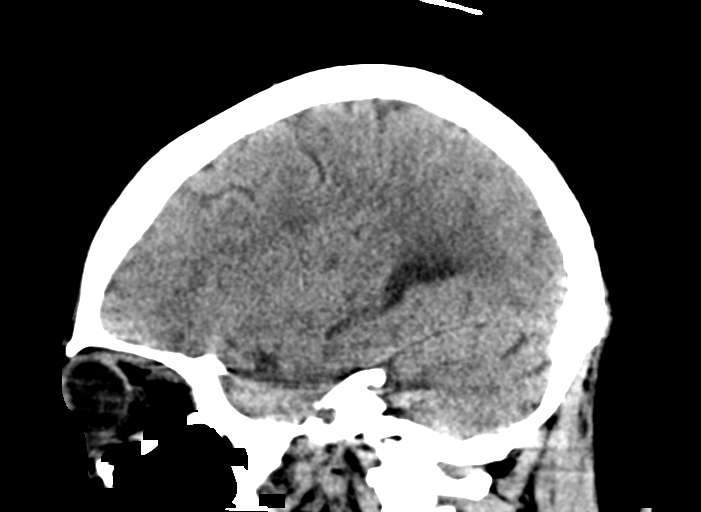
[im 34/67  brain]
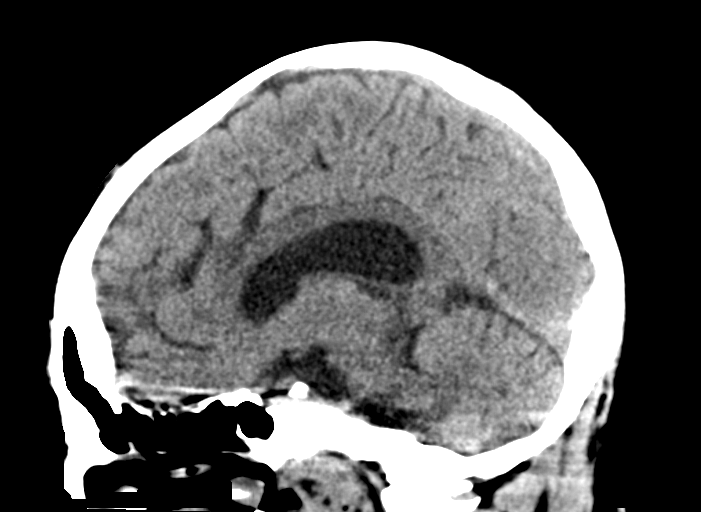
[im 45/67  brain]
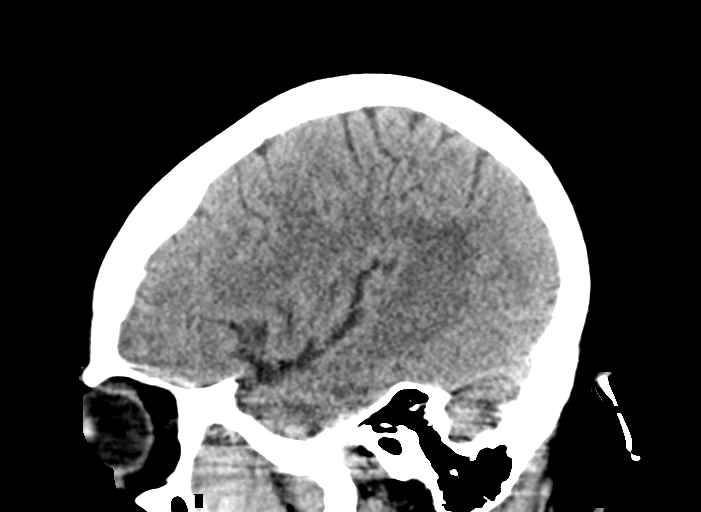

[15 of 37 positions shown; findings below may reference images not displayed]

FINDINGS: Brain: No acute intracranial hemorrhage, mass effect, or edema. No
new loss of gray-white differentiation. Chronic right occipital
infarct. Chronic infarct of right pons and brachium pontis is better
seen on MRI. Additional patchy hypoattenuation in the supratentorial
white matter is nonspecific but probably reflects similar chronic
microvascular ischemic changes. No hydrocephalus or extra-axial
collection.

Vascular: No hyperdense vessel. Mild intracranial atherosclerotic
calcification at the skull base.

Skull: Unremarkable.

Sinuses/Orbits: No acute abnormality.

Other: Mastoid air cells are clear.

ASPECTS (Alberta Stroke Program Early CT Score)

- Ganglionic level infarction (caudate, lentiform nuclei, internal
capsule, insula, M1-M3 cortex): 7

- Supraganglionic infarction (M4-M6 cortex): 3

Total score (0-10 with 10 being normal): 10
IMPRESSION: No acute intracranial hemorrhage or evidence of acute infarction.
Stable chronic/nonemergent findings detailed above.

These results were communicated to Dr. RIUFA at [DATE] on [DATE] by
text page via the [employer] messaging system.

## 2021-10-16 IMAGING — MR MR HEAD W/O CM
12 of 13 series · 44 of 48 positions shown · non-contrast
Comparison: Same-day CT/CTA head and neck

CLINICAL DATA: Left-sided weakness

EXAM:
MRI HEAD WITHOUT CONTRAST
TECHNIQUE: Multiplanar, multiecho pulse sequences of the brain and surrounding
structures were obtained without intravenous contrast.

[Series 5: DWI · axial · 3.0mm · 0.88mm/px · z∈[-117,+22]mm · 8 of 100 slices shown (1 of 4)]
[im 1/100]
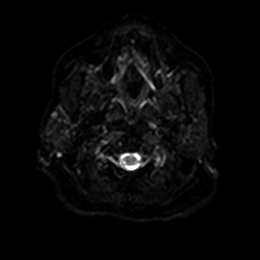
[im 15/100]
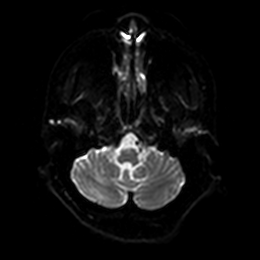
[im 29/100]
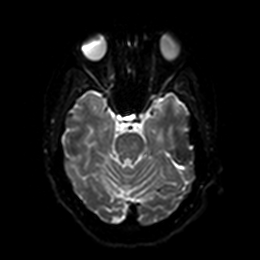
[im 43/100]
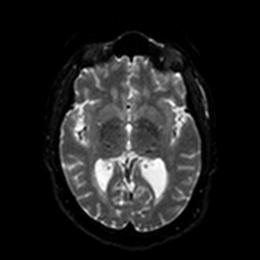
[im 57/100]
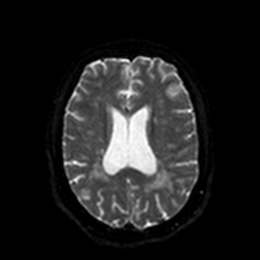
[im 71/100]
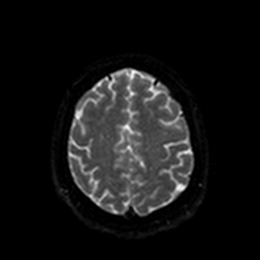
[im 85/100]
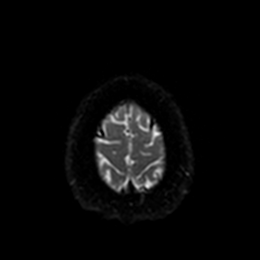
[im 100/100]
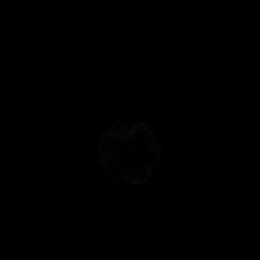

[Series 6: DWI · axial · 3.0mm · 0.88mm/px · z∈[-117,+22]mm · 4 of 50 slices shown (2 of 4)]
[im 1/50]
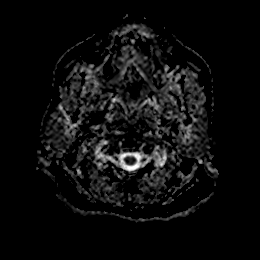
[im 17/50]
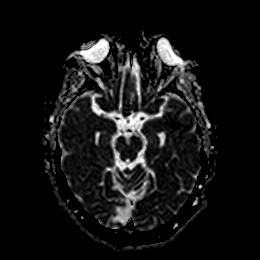
[im 33/50]
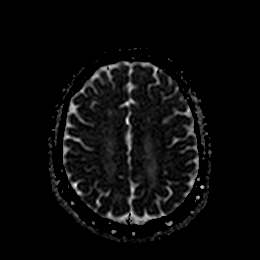
[im 50/50]
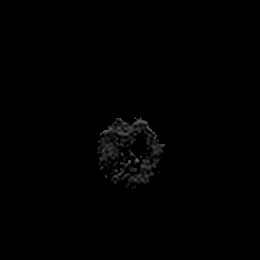

[Series 7: DWI · coronal · 4.0mm · 0.88mm/px · 5 of 68 slices shown (3 of 4)]
[im 1/68]
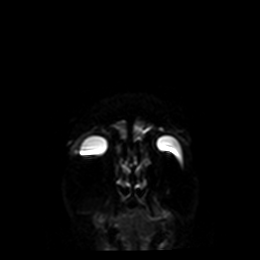
[im 17/68]
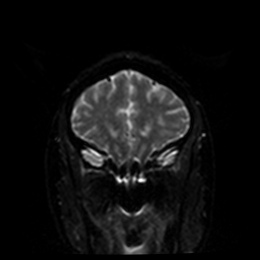
[im 34/68]
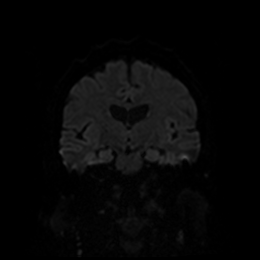
[im 51/68]
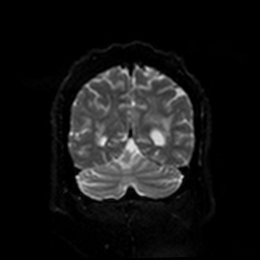
[im 68/68]
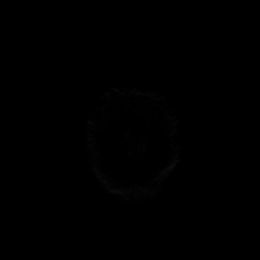

[Series 8: DWI · coronal · 4.0mm · 0.88mm/px · 3 of 34 slices shown (4 of 4)]
[im 1/34]
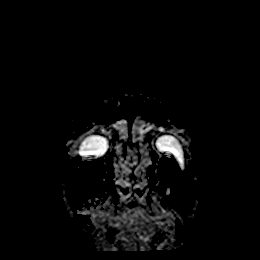
[im 17/34]
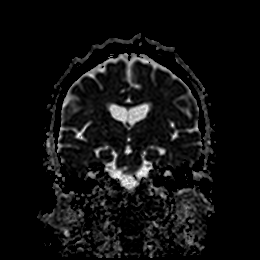
[im 34/34]
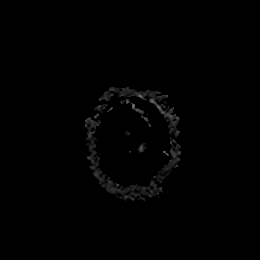

[Series 9: FLAIR · axial · 5.0mm · 0.45mm/px · z∈[-122,+26]mm · 2 of 27 slices shown]
[im 1/27]
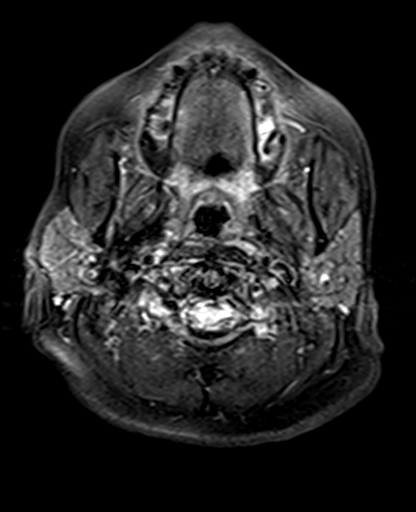
[im 27/27]
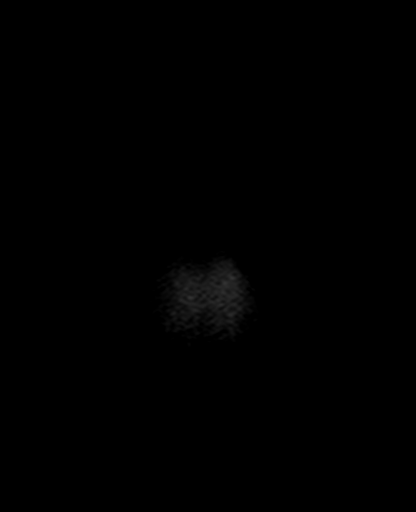

[Series 10: mag_images · axial · 3.0mm · 0.90mm/px · z∈[-120,+24]mm · 4 of 52 slices shown]
[im 1/52]
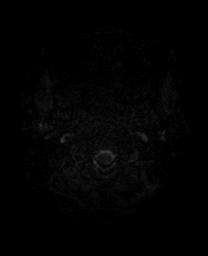
[im 18/52]
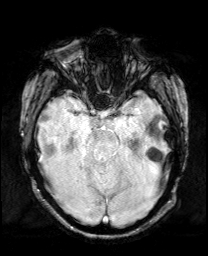
[im 35/52]
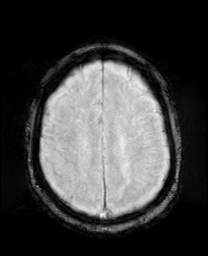
[im 52/52]
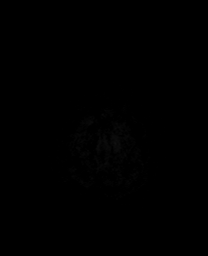

[Series 11: pha_images · axial · 3.0mm · 0.90mm/px · z∈[-120,+24]mm · 4 of 52 slices shown]
[im 1/52]
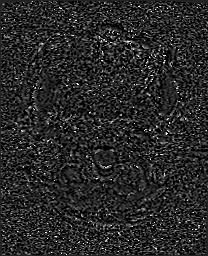
[im 18/52]
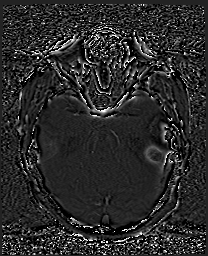
[im 35/52]
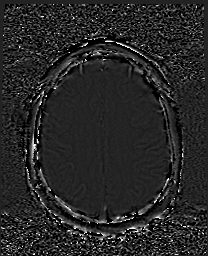
[im 52/52]
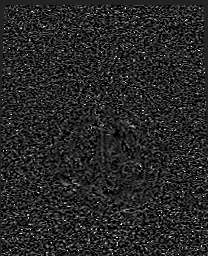

[Series 12: swi_images · axial · 3.0mm · 0.90mm/px · z∈[-120,+24]mm · 4 of 52 slices shown]
[im 1/52]
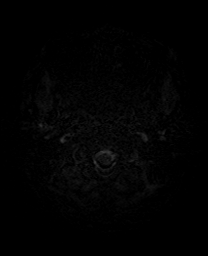
[im 18/52]
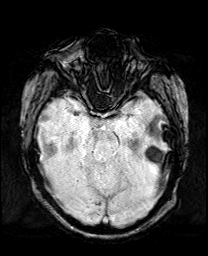
[im 35/52]
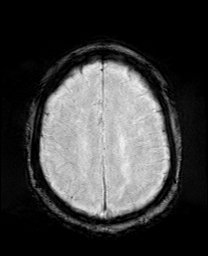
[im 52/52]
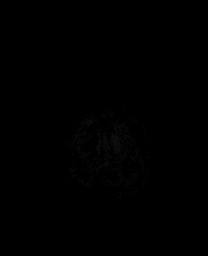

[Series 13: mip_images(sw) · axial · 24.0mm · 0.90mm/px · z∈[-110,+14]mm · 4 of 45 slices shown]
[im 1/45]
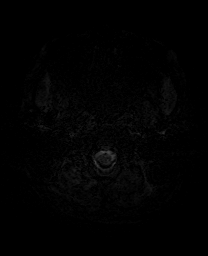
[im 15/45]
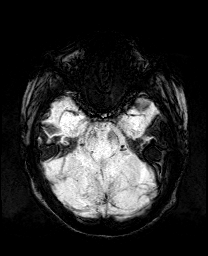
[im 30/45]
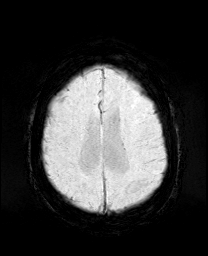
[im 45/45]
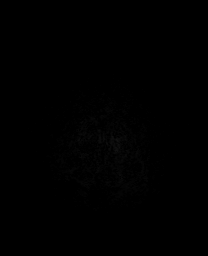

[Series 14: T2 · axial · 5.0mm · 0.72mm/px · z∈[-121,+26]mm · 2 of 27 slices shown (1 of 2)]
[im 1/27]
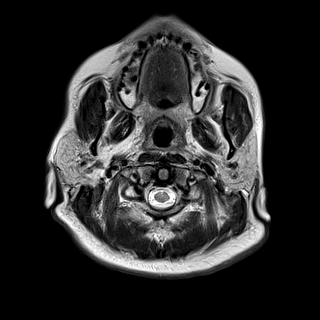
[im 27/27]
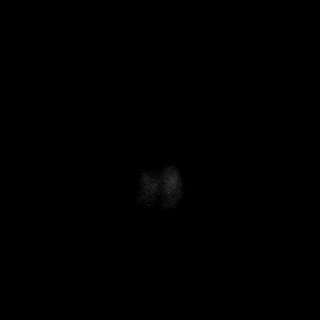

[Series 15: T1 · sagittal · 5.0mm · 0.75mm/px · 2 of 23 slices shown]
[im 1/23]
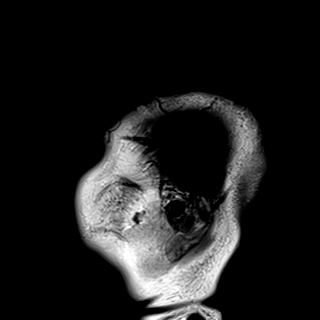
[im 23/23]
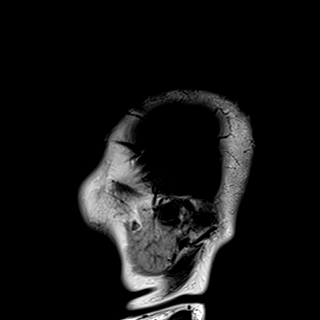

[Series 17: T2 · coronal · 5.0mm · 0.34mm/px · 2 of 29 slices shown (2 of 2)]
[im 1/29]
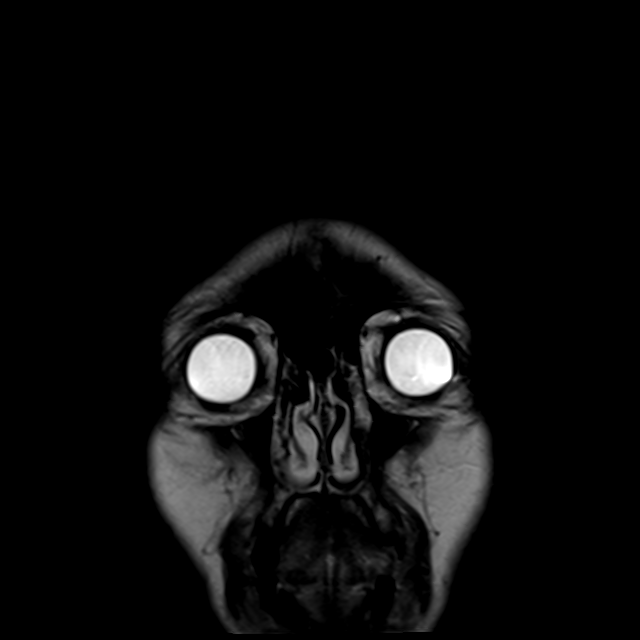
[im 29/29]
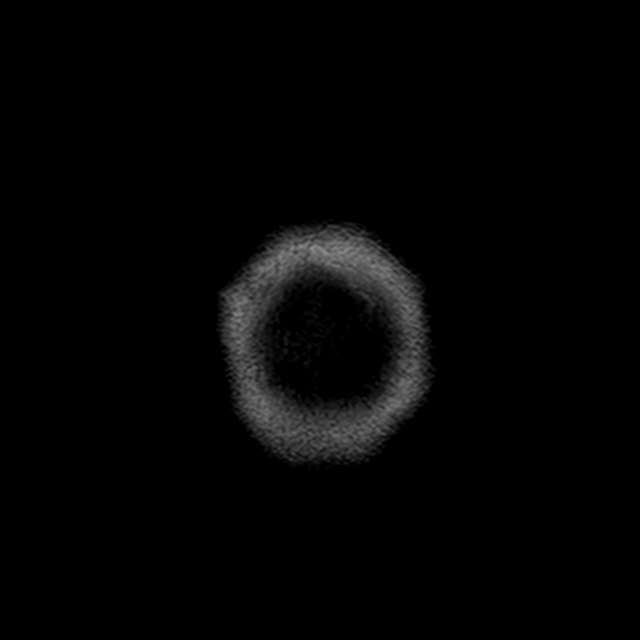

[44 of 48 positions shown; findings below may reference images not displayed]

FINDINGS: Brain: There is no acute intracranial hemorrhage, extra-axial fluid
collection, or acute infarct

Parenchymal volume is normal. The ventricles are normal in size.
There is extensive confluent FLAIR signal abnormality throughout the
subcortical and periventricular white matter likely reflecting
sequela of chronic white matter microangiopathy, significantly
advanced for age. Remote infarct in the right cerebellar hemisphere
extending to the middle cerebellar peduncle and pons is unchanged.
Additional prior infarcts are seen in the right occipital lobe and
left thalamus. Small foci of chronic hemorrhage are seen in the
right cerebellar hemisphere and bilateral thalami.

There is no mass lesion.  There is no mass effect or midline shift.

Vascular: Normal flow voids.

Skull and upper cervical spine: Normal marrow signal.

Sinuses/Orbits: The paranasal sinuses are clear. The globes and
orbits are unremarkable.

Other: None.
IMPRESSION: 1. No acute intracranial pathology.
2. Unchanged remote infarcts on a background of age advanced chronic
white matter microangiopathy as above.

## 2021-10-16 MED ORDER — LORAZEPAM 2 MG/ML IJ SOLN: 1.0000 mg | Freq: Once | INTRAMUSCULAR | Status: DC | PRN

## 2021-10-16 MED ORDER — LORAZEPAM 2 MG/ML IJ SOLN
2.0000 mg | Freq: Once | INTRAMUSCULAR | Status: AC
  Administered 2021-10-16: 2 mg via INTRAVENOUS
  Filled 2021-10-16: qty 1

## 2021-10-16 MED ORDER — IOHEXOL 350 MG/ML SOLN
100.0000 mL | Freq: Once | INTRAVENOUS | Status: AC | PRN
  Administered 2021-10-16: 100 mL via INTRAVENOUS

## 2021-10-16 MED ORDER — SODIUM CHLORIDE 0.9 % IV SOLN: 100.0000 mL/h | INTRAVENOUS | Status: DC

## 2021-10-16 MED ORDER — SODIUM CHLORIDE 0.9 % IV BOLUS
500.0000 mL | Freq: Once | INTRAVENOUS | Status: AC
  Administered 2021-10-16: 500 mL via INTRAVENOUS

## 2021-10-16 NOTE — Code Documentation (Signed)
Stroke Response Nurse Documentation ?Code Documentation ? ?Kaitlyn Gray is a 46 y.o. female arriving to [facility]  via [facility] on 10/16/2021 with past medical hx of stroke. Code stroke was activated by ED.  ? ?Patient from home where she was LKW at 2000 last night when she went to bed  and now complaining of left sided worsening weakness. Pt tried to get out of bed this morning and fell due to weakness. She crawled and was able to get herself back up  ? ?Stroke team at the bedside on patient activation. Labs drawn and patient cleared for CT by Dr. Gilford Raid. Patient to CT with team. NIHSS 10, see documentation for details and code stroke times. Patient with disoriented, left arm weakness, left leg weakness, left decreased sensation, Expressive aphasia , and dysarthria  along with left upper visual field loss on exam. The following imaging was completed:  CT Head, CTA, and CTP. Patient  a candidate for IV Thrombolytic due to being outside window. Pt is not candidate for IR due to no LVO on imaging per MD Erlinda Hong. ? ?Care Plan: q2 NIHSS/VS, MRI.  ? ?Bedside handoff with ED RN Lexine Baton.   ? ?Kathrin Greathouse  ?Stroke Response RN ? ? ?

## 2021-10-16 NOTE — ED Notes (Signed)
Patient transported to MRI 

## 2021-10-16 NOTE — ED Notes (Signed)
Pt refused to take departure vital signs. Stated" take all that sh*t off of me." Screamed and cursed at this Probation officer.  ?

## 2021-10-16 NOTE — Progress Notes (Signed)
Responded to page to support pt.that experienced a stoke.  Pt gone to CT for scam. Will follow as needed. ? ?Fae Pippin, [facility], Pager [phone]    ?

## 2021-10-16 NOTE — ED Provider Notes (Signed)
?[facility] ?Provider Note ? ? ?CSN: [id number] ?Arrival date & time: 10/16/21  0906 ? ?  ? ?History ? ?Chief Complaint  ?Patient presents with  ? Weakness  ? ? ?Kaitlyn Gray is a 46 y.o. female. ? ?Pt is a 46 yo female with a pmhx significant for CVA, high cholesterol, dm, htn, bipolar d/o, and polysubstance abuse.  Pt has been left with weakness to the left leg since her stroke in 2021.  Pt said she went to sleep her normal self and woke up this am and had much more weakness to the left leg.  She normally walks with a walker and was  unable to put any weight on her left leg.  She said she fell trying to walk.  She has some slurred speech that is worse than normal and left arm weakness.  No vision loss. The pt has not been taking any of her meds.  She denies any recent drug use.   ? ? ?  ? ?Home Medications ?Prior to Admission medications   ?Medication Sig Start Date End Date Taking? Authorizing Provider  ?aspirin 81 MG chewable tablet Chew 1 tablet (81 mg total) by mouth daily. 03/15/20   Carlisle Cater, PA-C  ?atorvastatin (LIPITOR) 80 MG tablet Take 1 tablet (80 mg total) by mouth daily. ?Patient not taking: Reported on 09/06/2020 03/15/20   Carlisle Cater, PA-C  ?blood glucose meter kit and supplies KIT Dispense based on patient and insurance preference. Use up to four times daily as directed. (FOR ICD-9 250.00, 250.01). ?Please give 3 months supply of lancets and test strips to allow her to check QID. 12/07/17   Roxan Hockey, MD  ?blood glucose meter kit and supplies [employer] Prime or Dispense other brand based on patient and insurance preference. Use up to four times daily as directed. (FOR ICD-9 250.00, 250.01). 12/07/17   Roxan Hockey, MD  ?ferrous sulfate 325 (65 FE) MG tablet Take 1 tablet (325 mg total) by mouth daily. 01/20/20   Charlesetta Shanks, MD  ?gabapentin (NEURONTIN) 400 MG capsule Take 1 capsule (400 mg total) by mouth 2 (two) times daily. 09/13/20    Nita Sells, MD  ?hydrOXYzine (ATARAX/VISTARIL) 25 MG tablet Take 0.5 tablets (12.5 mg total) by mouth every 6 (six) hours as needed for anxiety or itching. 09/13/20   Nita Sells, MD  ?Insulin Pen Needle 31G X 5 MM MISC Use as directed 12/07/17   Roxan Hockey, MD  ?lisinopril (ZESTRIL) 10 MG tablet Take 2 tablets (20 mg total) by mouth daily. 03/20/21 04/19/21  Wyvonnia Dusky, MD  ?metFORMIN (GLUCOPHAGE) 1000 MG tablet Take 1 tablet (1,000 mg total) by mouth 2 (two) times daily with a meal. ?Patient not taking: Reported on 09/06/2020 01/20/20   Charlesetta Shanks, MD  ?QUEtiapine (SEROQUEL) 200 MG tablet Take 200 mg by mouth at bedtime. 08/31/20   [provider]  ?SUBOXONE 8-2 MG FILM Place 1 Film under the tongue in the morning, at noon, and at bedtime. 09/13/20   Nita Sells, MD  ?   ? ?Allergies    ?Hydrocodone and Latex   ? ?Review of Systems   ?Review of Systems  ?Neurological:  Positive for speech difficulty, weakness and numbness.  ?All other systems reviewed and are negative. ? ?Physical Exam ?Updated Vital Signs ?BP (!) 166/95   Pulse 60   Temp 98 ?F (36.7 ?C) (Oral)   Resp 18   Ht 5' 4" (1.626 m)   Wt 91.6  kg   SpO2 98%   BMI 34.67 kg/m?  ?Physical Exam ?Vitals and nursing note reviewed.  ?Constitutional:   ?   Appearance: Normal appearance. She is obese.  ?HENT:  ?   Head: Normocephalic and atraumatic.  ?   Right Ear: External ear normal.  ?   Left Ear: External ear normal.  ?   Nose: Nose normal.  ?   Mouth/Throat:  ?   Mouth: Mucous membranes are moist.  ?   Pharynx: Oropharynx is clear.  ?Eyes:  ?   Extraocular Movements: Extraocular movements intact.  ?   Conjunctiva/sclera: Conjunctivae normal.  ?   Pupils: Pupils are equal, round, and reactive to light.  ?Cardiovascular:  ?   Rate and Rhythm: Normal rate and regular rhythm.  ?   Pulses: Normal pulses.  ?   Heart sounds: Normal heart sounds.  ?Pulmonary:  ?   Effort: Pulmonary effort is normal.  ?   Breath  sounds: Normal breath sounds.  ?Abdominal:  ?   General: Abdomen is flat. Bowel sounds are normal.  ?   Palpations: Abdomen is soft.  ?Musculoskeletal:  ?   Cervical back: Normal range of motion and neck supple.  ?Skin: ?   General: Skin is warm.  ?   Capillary Refill: Capillary refill takes less than 2 seconds.  ?Neurological:  ?   Mental Status: She is alert.  ?   Comments: Left arm and leg weakness and numbness; left facial droop.  Speech is slurred.  ?Psychiatric:     ?   Mood and Affect: Mood normal.  ? ? ?ED Results / Procedures / Treatments   ?Labs ?(all labs ordered are listed, but only abnormal results are displayed) ?Labs Reviewed  ?APTT - Abnormal; Notable for the following components:  ?    Result Value  ? aPTT 20 (*)   ? All other components within normal limits  ?CBC - Abnormal; Notable for the following components:  ? RBC 3.83 (*)   ? Hemoglobin 8.4 (*)   ? HCT 28.9 (*)   ? MCV 75.5 (*)   ? MCH 21.9 (*)   ? MCHC 29.1 (*)   ? RDW 17.9 (*)   ? All other components within normal limits  ?COMPREHENSIVE METABOLIC PANEL - Abnormal; Notable for the following components:  ? Glucose, Bld 136 (*)   ? Albumin 3.3 (*)   ? All other components within normal limits  ?I-STAT CHEM 8, ED - Abnormal; Notable for the following components:  ? Glucose, Bld 133 (*)   ? Calcium, Ion 1.10 (*)   ? Hemoglobin 9.9 (*)   ? HCT 29.0 (*)   ? All other components within normal limits  ?CBG MONITORING, ED - Abnormal; Notable for the following components:  ? Glucose-Capillary 134 (*)   ? All other components within normal limits  ?RESP PANEL BY RT-PCR (FLU A&B, COVID) ARPGX2  ?ETHANOL  ?PROTIME-INR  ?DIFFERENTIAL  ?RAPID URINE DRUG SCREEN, HOSP PERFORMED  ?URINALYSIS, ROUTINE W REFLEX MICROSCOPIC  ?MAGNESIUM  ?VITAMIN B12  ?FOLATE  ?IRON AND TIBC  ?FERRITIN  ?RETICULOCYTES  ?I-STAT BETA HCG BLOOD, ED ([facility], [facility], [facility] ONLY)  ? ? ?EKG ?EKG Interpretation ? ?Date/Time:  Wednesday Oct 16 2021 09:20:44 EDT ?Ventricular Rate:  54 ?PR  Interval:  127 ?QRS Duration: 87 ?QT Interval:  430 ?QTC Calculation: 408 ?R Axis:   10 ?Text Interpretation: Sinus rhythm Left ventricular hypertrophy No significant change since last tracing Confirmed by Isla Pence [phone])  on 10/16/2021 9:48:00 AM ? ?Radiology ?MR BRAIN WO CONTRAST ? ?Result Date: 10/16/2021 ?CLINICAL DATA:  Left-sided weakness EXAM: MRI HEAD WITHOUT CONTRAST TECHNIQUE: Multiplanar, multiecho pulse sequences of the brain and surrounding structures were obtained without intravenous contrast. COMPARISON:  Same-day CT/CTA head and neck FINDINGS: Brain: There is no acute intracranial hemorrhage, extra-axial fluid collection, or acute infarct Parenchymal volume is normal. The ventricles are normal in size. There is extensive confluent FLAIR signal abnormality throughout the subcortical and periventricular white matter likely reflecting sequela of chronic white matter microangiopathy, significantly advanced for age. Remote infarct in the right cerebellar hemisphere extending to the middle cerebellar peduncle and pons is unchanged. Additional prior infarcts are seen in the right occipital lobe and left thalamus. Small foci of chronic hemorrhage are seen in the right cerebellar hemisphere and bilateral thalami. There is no mass lesion.  There is no mass effect or midline shift. Vascular: Normal flow voids. Skull and upper cervical spine: Normal marrow signal. Sinuses/Orbits: The paranasal sinuses are clear. The globes and orbits are unremarkable. Other: None. IMPRESSION: 1. No acute intracranial pathology. 2. Unchanged remote infarcts on a background of age advanced chronic white matter microangiopathy as above. Electronically Signed   By: Valetta Mole M.D.   On: 10/16/2021 12:27  ? ?CT HEAD CODE STROKE WO CONTRAST ? ?Result Date: 10/16/2021 ?CLINICAL DATA:  Code stroke. EXAM: CT HEAD WITHOUT CONTRAST TECHNIQUE: Contiguous axial images were obtained from the base of the skull through the vertex without  intravenous contrast. RADIATION DOSE REDUCTION: This exam was performed according to the departmental dose-optimization program which includes automated exposure control, adjustment of the mA and/or kV according to patient size an

## 2021-10-16 NOTE — ED Notes (Signed)
Pt screaming in the room, stated that she needs to get up to stretch out the cramps in her leg. This RN and tech helped pt got back to bed. MD made aware. Ativan ordered.  ?

## 2021-10-16 NOTE — Consult Note (Signed)
Stroke Neurology Consultation Note ? ?Consult Requested by: Dr. Gilford Raid ? ?Reason for Consult: code stroke ? ?Consult Date: 10/16/21  ? ?The history was obtained from the pt.  During history and examination, all items were able to obtain unless otherwise noted. ? ?History of Present Illness:  Kaitlyn Gray is a 46 y.o. African American female with PMH of stroke, conversion disorder, pseudoseizure, pseudo stroke, HTN, HLD, DM, cocaine abuse, smoker, obesity presented to ER for left worsening left sided weakness and left leg/foot pain. Per pt, she was at her baseline last night before went to bed around 8 PM.  She woke up this morning, tried to get out of bed and she fell but did not hit head or neck.  She was able to manage to get herself up, but felt left-sided weakness and the pain.  She had a stroke before with residual left-sided weakness, but she felt her left-sided weakness worse than before this morning.  EMS was called and she was sent to ER for evaluation. Glucose 134 and BP 180s.  ? ?Hx of pseudoseizure ?02/01/2020 ED visit for unresponsiveness.  However exam concerning for psychogenic source.  EEG negative.  Discharged from ED. ?02/12/2020 during admission again for unresponsiveness. EEG normal.  ?  ?Hx stroke and pseudostroke ?12/2019 -admitted for right-sided weakness, numbness and slurred speech.  MRI showed acute/subacute demyelination vs Subacute infarction R middle cerebral peduncle.  MRA showed Multifocal intracranial atherosclerotic disease vs vasculitis vs cocaine induced vasculopathy, including left ICA siphon moderate stenosis, left M2, right P2 and left P4 moderate to severe stenosis.  Carotid Doppler negative.  EF 55 to 60%.  LP w/ slightly elevated protein 69, WBC 4, negative oligoclonol bands.  UDS positive for cocaine.  A1c 7.5, LDL 122.  ANA positive.  Patient left AMA at that time.  Patient diagnosis most likely due to ischemic infarction. ?02/09/2020 admitted for right pontine infarct.  CT showed right middle cerebellar peduncle old infarct. MRI acute R pontine infarct with evolving right cerebellar peduncle ischemic abnormality. LDL 75 and A1C 6.2. put on DAPT for 3 week then ASA alone. ?08/2020 admitted for worsening weakness on the left.  Found to have extensive left-sided pain also.  MRI no acute infarct.  CT head and neck right ICA 50% stenosis, right P2 stenosis, multifocal intracranial stenosis.  LDL 102, A1c 7.3.  Patient continued on aspirin 81 ?03/2021 admitted again for confusion and slurred speech, with "baby voice".  MRI again no acute infarct. ? ?LSN: 8pm last night ?tPA Given: No: Outside window ? ?Past Medical History:  ?Diagnosis Date  ? Bipolar 1 disorder ([place])   ? 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 Hypertension   ? Stroke [facility])   ? ? ?Past Surgical History:  ?Procedure Laterality Date  ? CESAREAN SECTION    ? ? ?Family History  ?Problem Relation Age of Onset  ? Hypertension Mother   ? CAD Mother 4  ?     died of MI at age 52  ? Hypertension Father   ? ? ?Social History:  reports that she has been smoking cigarettes. She has been smoking an average of .5 packs per day. She has never used smokeless tobacco. She reports current drug use. Drugs: Marijuana and Cocaine. She reports that she does not drink alcohol. ? ?Allergies:  ?Allergies  ?Allergen Reactions  ? Hydrocodone Itching  ? Latex Rash  ? ? ?No current facility-administered medications on file prior to  encounter.  ? ?Current Outpatient Medications on File Prior to Encounter  ?Medication Sig Dispense Refill  ? aspirin 81 MG chewable tablet Chew 1 tablet (81 mg total) by mouth daily. 30 tablet 1  ? atorvastatin (LIPITOR) 80 MG tablet Take 1 tablet (80 mg total) by mouth daily. (Patient not taking: Reported on 09/06/2020) 30 tablet 1  ? blood glucose meter kit and supplies KIT Dispense based on patient and insurance preference. Use up to four times daily as directed. (FOR  ICD-9 250.00, 250.01). ?Please give 3 months supply of lancets and test strips to allow her to check QID. 1 each 0  ? blood glucose meter kit and supplies Relion Prime or Dispense other brand based on patient and insurance preference. Use up to four times daily as directed. (FOR ICD-9 250.00, 250.01). 1 each 3  ? ferrous sulfate 325 (65 FE) MG tablet Take 1 tablet (325 mg total) by mouth daily. 30 tablet 0  ? gabapentin (NEURONTIN) 400 MG capsule Take 1 capsule (400 mg total) by mouth 2 (two) times daily. 12 capsule 0  ? hydrOXYzine (ATARAX/VISTARIL) 25 MG tablet Take 0.5 tablets (12.5 mg total) by mouth every 6 (six) hours as needed for anxiety or itching. 30 tablet 0  ? Insulin Pen Needle 31G X 5 MM MISC Use as directed 100 each 2  ? lisinopril (ZESTRIL) 10 MG tablet Take 2 tablets (20 mg total) by mouth daily. 60 tablet 2  ? metFORMIN (GLUCOPHAGE) 1000 MG tablet Take 1 tablet (1,000 mg total) by mouth 2 (two) times daily with a meal. (Patient not taking: Reported on 09/06/2020) 60 tablet 0  ? QUEtiapine (SEROQUEL) 200 MG tablet Take 200 mg by mouth at bedtime.    ? SUBOXONE 8-2 MG FILM Place 1 Film under the tongue in the morning, at noon, and at bedtime. 3 each 0  ? ? ?Review of Systems: A full ROS was attempted today and was able to be performed.  Systems assessed include - Constitutional, Eyes, HENT, Respiratory, Cardiovascular, Gastrointestinal, Genitourinary, Integument/breast, Hematologic/lymphatic, Musculoskeletal, Neurological, Behavioral/Psych, Endocrine, Allergic/Immunologic - with pertinent responses as per HPI. ? ?Physical Examination: ?Temp:  [98 ?F (36.7 ?C)] 98 ?F (36.7 ?C) (05/03 7591) ?Pulse Rate:  [50-60] 60 (05/03 1315) ?Resp:  [11-29] 18 (05/03 1430) ?BP: (131-187)/(88-144) 166/95 (05/03 1415) ?SpO2:  [98 %-99 %] 98 % (05/03 1415) ?Weight:  [91.6 kg] 91.6 kg (05/03 0952) ? ?General - well nourished, well developed, mild to moderate pain at left foot.   ? ?Ophthalmologic - fundi not visualized  due to noncooperation.   ? ?Cardiovascular - regular rhythm and rate ? ?Neuro - awake, alert, eyes open, orientated to age, place, but not to time. No aphasia, moderate dysarthria with "baby voice" and stuttering, following all simple commands. Able to name and repeat with " baby voice". No gaze palsy, tracking bilaterally, visual field exam not quite operative but blinking to visual threat bilaterally, PERRL.  Slight left nasolabial fold flattening. Tongue midline.  Right upper and lower extremity 5/5, left upper extremity lack of effort, drift to bed within 10 seconds, left lower extremity lack of effort on movement and extreme painful with passive movement. Sensation subjectively decreased on the left, b/l FTN intact but slow on the left, gait not tested.  ? ? ?Data Reviewed: ?MR BRAIN WO CONTRAST ? ?Result Date: 10/16/2021 ?CLINICAL DATA:  Left-sided weakness EXAM: MRI HEAD WITHOUT CONTRAST TECHNIQUE: Multiplanar, multiecho pulse sequences of the brain and surrounding structures were obtained without intravenous contrast.  COMPARISON:  Same-day CT/CTA head and neck FINDINGS: Brain: There is no acute intracranial hemorrhage, extra-axial fluid collection, or acute infarct Parenchymal volume is normal. The ventricles are normal in size. There is extensive confluent FLAIR signal abnormality throughout the subcortical and periventricular white matter likely reflecting sequela of chronic white matter microangiopathy, significantly advanced for age. Remote infarct in the right cerebellar hemisphere extending to the middle cerebellar peduncle and pons is unchanged. Additional prior infarcts are seen in the right occipital lobe and left thalamus. Small foci of chronic hemorrhage are seen in the right cerebellar hemisphere and bilateral thalami. There is no mass lesion.  There is no mass effect or midline shift. Vascular: Normal flow voids. Skull and upper cervical spine: Normal marrow signal. Sinuses/Orbits: The paranasal  sinuses are clear. The globes and orbits are unremarkable. Other: None. IMPRESSION: 1. No acute intracranial pathology. 2. Unchanged remote infarcts on a background of age advanced chronic white matter mi

## 2021-10-16 NOTE — ED Triage Notes (Signed)
Pt arrives via EMS from home, hx of prev stroke with left sided deficits. Pt called ems stating increased left sided weakness, slurred speech and aphasia. Pt was A&Ox1. Pt alert, asking for blankets, appropriate to baseline at present. Vitals 150/110, HR 54 cbg 150, sats 99, RR 16. EKg SB. Noncompliant with meds.  ?

## 2021-10-16 NOTE — TOC CAGE-AID Note (Signed)
Transition of Care (TOC) - CAGE-AID Screening ? ? ?Patient Details  ?Name: Kaitlyn Gray ?MRN: [id number] ?Date of Birth: 1975-08-30 ? ?Transition of Care (TOC) CM/SW Contact:    ?Jaz Mallick C Tarpley-Carter, LCSWA ?Phone Number: ?10/16/2021, 2:55 PM ? ? ?Clinical Narrative: ?Pt participated in Cage-Aid.  Pt stated she does use substance.  Pt was offered resources, due to usage of substance.   ? ?Insurance underwriter, MSW, LCSW-A ?Pronouns:  She/Her/Hers ?[facility] ?Clinical Social Worker ?Direct Number:  [phone] ?[email or web address] ? ? ?CAGE-AID Screening: ?  ? ?Have You Ever Felt You Ought to Cut Down on Your Drinking or Drug Use?: Yes ?Have People Annoyed You By Critizing Your Drinking Or Drug Use?: No ?Have You Felt Bad Or Guilty About Your Drinking Or Drug Use?: No ?Have You Ever Had a Drink or Used Drugs First Thing In The Morning to Steady Your Nerves or to Get Rid of a Hangover?: No ?CAGE-AID Score: 1 ? ?Substance Abuse Education Offered: Yes ? ?Substance abuse interventions: Educational Materials ? ? ? ? ? ? ?

## 2021-10-16 NOTE — ED Notes (Signed)
Pt returned from CT, within window for IR until 7:58pm ?

## 2021-11-24 ENCOUNTER — Emergency Department (HOSPITAL_COMMUNITY)
Admission: EM | Admit: 2021-11-24 | Discharge: 2021-11-24 | Discharge status: Against Medical Advice | Payer: Medicaid Other | Attending: Emergency Medicine | Admitting: Emergency Medicine

## 2021-11-24 ENCOUNTER — Other Ambulatory Visit: Payer: Self-pay

## 2021-11-24 ENCOUNTER — Emergency Department (HOSPITAL_COMMUNITY)
Admission: EM | Admit: 2021-11-24 | Discharge: 2021-11-24 | Discharge status: Against Medical Advice | Payer: Medicaid Other

## 2021-11-24 ENCOUNTER — Encounter (HOSPITAL_COMMUNITY): Payer: Self-pay

## 2021-11-24 DIAGNOSIS — R42 Dizziness and giddiness: Secondary | ICD-10-CM | POA: Insufficient documentation

## 2021-11-24 DIAGNOSIS — Z5321 Procedure and treatment not carried out due to patient leaving prior to being seen by health care provider: Secondary | ICD-10-CM | POA: Diagnosis not present

## 2021-11-24 LAB — COMPREHENSIVE METABOLIC PANEL
ALT: 23 U/L (ref 0–44)
AST: 15 U/L (ref 15–41)
Albumin: 3.3 g/dL — ABNORMAL LOW (ref 3.5–5.0)
Alkaline Phosphatase: 72 U/L (ref 38–126)
Anion gap: 8 (ref 5–15)
BUN: 16 mg/dL (ref 6–20)
CO2: 25 mmol/L (ref 22–32)
Calcium: 8.7 mg/dL — ABNORMAL LOW (ref 8.9–10.3)
Chloride: 104 mmol/L (ref 98–111)
Creatinine, Ser: 1.02 mg/dL — ABNORMAL HIGH (ref 0.44–1.00)
GFR, Estimated: >60 mL/min (ref >60)
Glucose, Bld: 101 mg/dL — ABNORMAL HIGH (ref 70–99)
Potassium: 3.3 mmol/L — ABNORMAL LOW (ref 3.5–5.1)
Sodium: 137 mmol/L (ref 135–145)
Total Bilirubin: 0.3 mg/dL (ref 0.3–1.2)
Total Protein: 6.9 g/dL (ref 6.5–8.1)

## 2021-11-24 LAB — MAGNESIUM: Magnesium: 1.9 mg/dL (ref 1.7–2.4)

## 2021-11-24 LAB — CBC WITH DIFFERENTIAL/PLATELET
Abs Immature Granulocytes: 0.01 10*3/uL (ref 0.00–0.07)
Basophils Absolute: 0 10*3/uL (ref 0.0–0.1)
Basophils Relative: 1 %
Eosinophils Absolute: 0.1 10*3/uL (ref 0.0–0.5)
Eosinophils Relative: 3 %
HCT: 28.5 % — ABNORMAL LOW (ref 36.0–46.0)
Hemoglobin: 8.3 g/dL — ABNORMAL LOW (ref 12.0–15.0)
Immature Granulocytes: 0 %
Lymphocytes Relative: 35 %
Lymphs Abs: 1.9 10*3/uL (ref 0.7–4.0)
MCH: 21.6 pg — ABNORMAL LOW (ref 26.0–34.0)
MCHC: 29.1 g/dL — ABNORMAL LOW (ref 30.0–36.0)
MCV: 74.2 fL — ABNORMAL LOW (ref 80.0–100.0)
Monocytes Absolute: 0.4 10*3/uL (ref 0.1–1.0)
Monocytes Relative: 7 %
Neutro Abs: 2.8 10*3/uL (ref 1.7–7.7)
Neutrophils Relative %: 54 %
Platelets: 308 10*3/uL (ref 150–400)
RBC: 3.84 MIL/uL — ABNORMAL LOW (ref 3.87–5.11)
RDW: 17.9 % — ABNORMAL HIGH (ref 11.5–15.5)
WBC: 5.3 10*3/uL (ref 4.0–10.5)
nRBC: 0 % (ref 0.0–0.2)

## 2021-11-24 LAB — CBG MONITORING, ED: Glucose-Capillary: 156 mg/dL — ABNORMAL HIGH (ref 70–99)

## 2021-11-24 NOTE — ED Provider Triage Note (Signed)
Emergency Medicine Provider Triage Evaluation Note  Kaitlyn Gray , a 46 y.o. female  was evaluated in triage.  Pt complains of dizziness x today.  She tells me that she has been very dizzy, that she had falls, that she is hurting all over, that she feels that her speech is changed.  Patient did arrive to the emergency department earlier via EMS, after not being given the room patient walked out with her walker out of the ED.  She now returns today as she states that she is still dizzy.  Patient is texting in no discomfort in the room. Tangential speech.  Review of Systems  Positive: dizzy Negative: Fever, headache  Physical Exam  BP (!) 157/97 (BP Location: Right Arm)   Pulse (!) 55   Temp 98.1 F (36.7 C) (Oral)   Resp 20   SpO2 96%  Gen:   Awake, no distress   Resp:  Normal effort  MSK:   Moves extremities without difficulty  Other:    Medical Decision Making  Medically screening exam initiated at 12:55 PM.  Appropriate orders placed.  Kaitlyn Gray was informed that the remainder of the evaluation will be completed by another provider, this initial triage assessment does not replace that evaluation, and the importance of remaining in the ED until their evaluation is complete.     Claude Manges, PA-C 11/24/21 1259

## 2021-11-24 NOTE — ED Notes (Signed)
Patient arrived by EMS cursing and yelling because she was brought to triage. Patient refuses to answer questions and doesn't want to be evaluated

## 2021-11-24 NOTE — ED Triage Notes (Signed)
Patient originally left because she did not want to come to triage and didn't want to wait to be seen. Now patient checking back in and states that she feels dizzy. Patient alert and oriented. Patient has slurred speech but reports this is normal

## 2021-11-24 NOTE — ED Notes (Signed)
Pt seen by this NT getting into visitors car and leaving.

## 2021-12-12 ENCOUNTER — Encounter (HOSPITAL_COMMUNITY): Payer: Self-pay | Admitting: Emergency Medicine

## 2021-12-12 ENCOUNTER — Other Ambulatory Visit: Payer: Self-pay

## 2021-12-12 ENCOUNTER — Emergency Department (HOSPITAL_COMMUNITY)
Admission: EM | Admit: 2021-12-12 | Discharge: 2021-12-13 | Disposition: A | Discharge status: Home / Self Care | Payer: Medicaid Other | Attending: Emergency Medicine | Admitting: Emergency Medicine

## 2021-12-12 DIAGNOSIS — E876 Hypokalemia: Secondary | ICD-10-CM | POA: Diagnosis not present

## 2021-12-12 DIAGNOSIS — F1420 Cocaine dependence, uncomplicated: Secondary | ICD-10-CM | POA: Diagnosis present

## 2021-12-12 DIAGNOSIS — Z91040 Latex allergy status: Secondary | ICD-10-CM | POA: Diagnosis not present

## 2021-12-12 DIAGNOSIS — F17210 Nicotine dependence, cigarettes, uncomplicated: Secondary | ICD-10-CM | POA: Insufficient documentation

## 2021-12-12 DIAGNOSIS — F1499 Cocaine use, unspecified with unspecified cocaine-induced disorder: Secondary | ICD-10-CM | POA: Insufficient documentation

## 2021-12-12 DIAGNOSIS — F332 Major depressive disorder, recurrent severe without psychotic features: Secondary | ICD-10-CM | POA: Diagnosis not present

## 2021-12-12 DIAGNOSIS — Y900 Blood alcohol level of less than 20 mg/100 ml: Secondary | ICD-10-CM | POA: Diagnosis not present

## 2021-12-12 DIAGNOSIS — R45851 Suicidal ideations: Secondary | ICD-10-CM | POA: Diagnosis not present

## 2021-12-12 DIAGNOSIS — Z046 Encounter for general psychiatric examination, requested by authority: Secondary | ICD-10-CM | POA: Diagnosis present

## 2021-12-12 DIAGNOSIS — Z7984 Long term (current) use of oral hypoglycemic drugs: Secondary | ICD-10-CM | POA: Diagnosis not present

## 2021-12-12 DIAGNOSIS — Z9189 Other specified personal risk factors, not elsewhere classified: Secondary | ICD-10-CM

## 2021-12-12 DIAGNOSIS — Z79899 Other long term (current) drug therapy: Secondary | ICD-10-CM | POA: Insufficient documentation

## 2021-12-12 DIAGNOSIS — F1910 Other psychoactive substance abuse, uncomplicated: Secondary | ICD-10-CM

## 2021-12-12 DIAGNOSIS — F4310 Post-traumatic stress disorder, unspecified: Secondary | ICD-10-CM | POA: Diagnosis not present

## 2021-12-12 DIAGNOSIS — F319 Bipolar disorder, unspecified: Secondary | ICD-10-CM | POA: Diagnosis not present

## 2021-12-12 DIAGNOSIS — I10 Essential (primary) hypertension: Secondary | ICD-10-CM | POA: Insufficient documentation

## 2021-12-12 DIAGNOSIS — F32A Depression, unspecified: Secondary | ICD-10-CM | POA: Diagnosis present

## 2021-12-12 DIAGNOSIS — Z794 Long term (current) use of insulin: Secondary | ICD-10-CM | POA: Insufficient documentation

## 2021-12-12 DIAGNOSIS — Z7982 Long term (current) use of aspirin: Secondary | ICD-10-CM | POA: Insufficient documentation

## 2021-12-12 DIAGNOSIS — E119 Type 2 diabetes mellitus without complications: Secondary | ICD-10-CM | POA: Insufficient documentation

## 2021-12-12 LAB — COMPREHENSIVE METABOLIC PANEL
ALT: 13 U/L (ref 0–44)
AST: 15 U/L (ref 15–41)
Albumin: 3.6 g/dL (ref 3.5–5.0)
Alkaline Phosphatase: 68 U/L (ref 38–126)
Anion gap: 10 (ref 5–15)
BUN: 10 mg/dL (ref 6–20)
CO2: 25 mmol/L (ref 22–32)
Calcium: 8.7 mg/dL — ABNORMAL LOW (ref 8.9–10.3)
Chloride: 105 mmol/L (ref 98–111)
Creatinine, Ser: 0.92 mg/dL (ref 0.44–1.00)
GFR, Estimated: >60 mL/min (ref >60)
Glucose, Bld: 130 mg/dL — ABNORMAL HIGH (ref 70–99)
Potassium: 3 mmol/L — ABNORMAL LOW (ref 3.5–5.1)
Sodium: 140 mmol/L (ref 135–145)
Total Bilirubin: 0.4 mg/dL (ref 0.3–1.2)
Total Protein: 7.5 g/dL (ref 6.5–8.1)

## 2021-12-12 LAB — CBC
HCT: 30 % — ABNORMAL LOW (ref 36.0–46.0)
Hemoglobin: 8.5 g/dL — ABNORMAL LOW (ref 12.0–15.0)
MCH: 20.5 pg — ABNORMAL LOW (ref 26.0–34.0)
MCHC: 28.3 g/dL — ABNORMAL LOW (ref 30.0–36.0)
MCV: 72.5 fL — ABNORMAL LOW (ref 80.0–100.0)
Platelets: 421 10*3/uL — ABNORMAL HIGH (ref 150–400)
RBC: 4.14 MIL/uL (ref 3.87–5.11)
RDW: 18.4 % — ABNORMAL HIGH (ref 11.5–15.5)
WBC: 5.8 10*3/uL (ref 4.0–10.5)
nRBC: 0 % (ref 0.0–0.2)

## 2021-12-12 LAB — SALICYLATE LEVEL: Salicylate Lvl: <7 mg/dL — ABNORMAL LOW (ref 7.0–30.0)

## 2021-12-12 LAB — ACETAMINOPHEN LEVEL: Acetaminophen (Tylenol), Serum: <10 ug/mL — ABNORMAL LOW (ref 10–30)

## 2021-12-12 LAB — I-STAT BETA HCG BLOOD, ED (MC, WL, AP ONLY): I-stat hCG, quantitative: <5 m[IU]/mL (ref <5)

## 2021-12-12 LAB — ETHANOL: Alcohol, Ethyl (B): <10 mg/dL (ref <10)

## 2021-12-12 MED ORDER — HYDROXYZINE HCL 25 MG PO TABS: 12.5000 mg | ORAL_TABLET | Freq: Four times a day (QID) | ORAL | Status: DC | PRN

## 2021-12-12 MED ORDER — ATORVASTATIN CALCIUM 80 MG PO TABS
80.0000 mg | ORAL_TABLET | Freq: Every day | ORAL | Status: DC
  Administered 2021-12-12 – 2021-12-13 (×2): 80 mg via ORAL
  Filled 2021-12-12 (×2): qty 1

## 2021-12-12 MED ORDER — POTASSIUM CHLORIDE CRYS ER 20 MEQ PO TBCR
40.0000 meq | EXTENDED_RELEASE_TABLET | Freq: Once | ORAL | Status: AC
  Administered 2021-12-12: 40 meq via ORAL
  Filled 2021-12-12 (×2): qty 2

## 2021-12-12 MED ORDER — AMLODIPINE BESYLATE 5 MG PO TABS
5.0000 mg | ORAL_TABLET | Freq: Every day | ORAL | Status: DC
  Administered 2021-12-12 – 2021-12-13 (×2): 5 mg via ORAL
  Filled 2021-12-12 (×2): qty 1

## 2021-12-12 MED ORDER — FERROUS SULFATE 325 (65 FE) MG PO TABS
325.0000 mg | ORAL_TABLET | Freq: Every day | ORAL | Status: DC
  Administered 2021-12-12 – 2021-12-13 (×2): 325 mg via ORAL
  Filled 2021-12-12 (×2): qty 1

## 2021-12-12 MED ORDER — GABAPENTIN 300 MG PO CAPS
300.0000 mg | ORAL_CAPSULE | Freq: Two times a day (BID) | ORAL | Status: DC
  Administered 2021-12-12 – 2021-12-13 (×2): 300 mg via ORAL
  Filled 2021-12-12 (×2): qty 1

## 2021-12-12 MED ORDER — METFORMIN HCL 500 MG PO TABS
1000.0000 mg | ORAL_TABLET | Freq: Two times a day (BID) | ORAL | Status: DC
  Administered 2021-12-12 – 2021-12-13 (×2): 1000 mg via ORAL
  Filled 2021-12-12 (×2): qty 2

## 2021-12-12 MED ORDER — GLIPIZIDE ER 10 MG PO TB24
10.0000 mg | ORAL_TABLET | Freq: Every day | ORAL | Status: DC
  Filled 2021-12-12: qty 1

## 2021-12-12 MED ORDER — QUETIAPINE FUMARATE 200 MG PO TABS
200.0000 mg | ORAL_TABLET | Freq: Every day | ORAL | Status: DC
  Administered 2021-12-12: 200 mg via ORAL
  Filled 2021-12-12: qty 1

## 2021-12-12 NOTE — Progress Notes (Signed)
TTS has been consulted to evaluate patient.

## 2021-12-12 NOTE — ED Triage Notes (Signed)
Patient here with complaint of depression and wanting to talk to a psychiatrist. Patient reports suicidal ideations and point to abrasion to left wrist. Patient reports that her utilities at her home were recently disconnected and that she also wants help to stop using crack cocaine. Paitent is alert, oriented, and in no apparent distress at this time.

## 2021-12-12 NOTE — BH Assessment (Addendum)
Comprehensive Clinical Assessment (CCA) Note  12/12/2021 Kaitlyn Gray [id number]  DISPOSITION: Per NP, [name] pt is recommended to be observed overnight and re-assessed tomorrow.   The patient demonstrates the following risk factors for suicide: Chronic risk factors for suicide include: psychiatric disorder of Bipolar d/o and substance use disorder. Acute risk factors for suicide include:  financial difficulties and relapse on drug use . Protective factors for this patient include: hope for the future. Considering these factors, the overall suicide risk at this point appears to be low. Patient is appropriate for outpatient follow up.  Flowsheet Row ED from 12/12/2021 in [facility] EMERGENCY DEPARTMENT ED from 11/24/2021 in [facility] EMERGENCY DEPARTMENT ED from 10/16/2021 in [facility] EMERGENCY DEPARTMENT  C-SSRS RISK CATEGORY No Risk No Risk No Risk      Pt is a 46 yo female who presented to the ED today voluntarily and unaccompanied c/o worsening depression and wanting help to stop using crack cocaine. Pt initially had reported SI but denied any SI at any time in the assessment. Pt also denied any past suicide attempts. Pt was oriented and answered identifying questions to that effect. Pt was increasingly having difficulty staying awake as the assessment continued. Pt state that she had been completely sober for about 8 months and relapsed on crack about 2-3 days ago and has been suing daily since that time. Pt's UDS is incomplete at this time. Her ETOH was 0. Pt denied SI or any attempts recently or in the past, HI, paranoia and any other drug or alcohol use beside the use of crack cocaine. Pt was vague when asked if she was experiencing AVH. She stated in garbled speech that she sometimes sees "people on the wall" and hears voices. She did not answer when asked if the AVH happen at time when she is not using/abusing substances. Pt  reported a hx of superficial cutting which she reported engaging in recently although she was vague as to when the most recent injury occurred. Pt had cuts on her arm although it is unclear how recent the cuts were. Pt stated that she engages in cutting about once a year. Hx of IP psychiatric admissions at [facility] in 2010, 2014 and 2016. She could not remember why she was admitted.   Pt stated that she was stressed over her utilities being disconnected and over her relapse on crack cocaine. Pt reported "occasional" use of cannabis but could not remember her last use. Pt stated that she had been receiving psychiatric services from [facility] and was getting a monthly Abilify shot which she stated "helped me a lot." Pt state she has not had a shot of Abilify in over a year. Pt voices interest in going back to [place] where she received services before [facility]. No current OP psych providers. Pt reported difficulty sleeping and stated that she has taken Seroquel in the past which helped her sleep.     Chief Complaint:  Chief Complaint  Patient presents with   Suicidal   Visit Diagnosis:  Bipolar d/o PTSD    CCA Screening, Triage and Referral (STR)  Patient Reported Information How did you hear about [place]? Self  What Is the Reason for Your Visit/Call Today? Pt is a 46 yo female who presented to the ED today voluntarily and unaccompanied c/o worsening depression and wanting help to stop using crack cocaine. Pt initially had reported SI but denied any SI at any time in the assessment.  Pt also denied any past suicide attempts. Pt was oriented and answered identifying questions to that effect. Pt was increasingly having difficulty staying awake as the assessment continued. Pt state that she had been completely sober for about 8 months and relapsed on crack about 2-3 days ago and has been suing daily since that time. Pt's UDS is incomplete at this time. Her ETOH was 0. Pt denied SI or any attempts  recently or in the past, HI, paranoia and any other drug or alcohol use beside the use of crack cocaine. Pt was vague when asked if she was experiencing AVH. She stated in garbled speech that she sometimes sees "people on the wall" and hears voices. She did not answer when asked if the AVH happen at time when she is not using/abusing substances. Pt reported a hx of superficial cutting which she reported engaging in recently although she was vague as to when the most recent injury occurred. Pt had cuts on her arm although it is unclear how recent the cuts were. Pt stated that she engages in cutting about once a year. Hx of IP psychiatric admissions at [facility] in 2010, 2014 and 2016. She could not remember why she was admitted. Pt stated that she was stressed over her utilities being disconnected and over her relapse on crack cocaine. Pt reported "occasional" use of cannabis but could not remember her last use. Pt stated that she had been receiving psychiatric services from [facility] and was getting a monthly Abilify shot which she stated "helped me a lot." Pt state she has not had a shot of Abilify in over a year. Pt voices interest in going back to [place] where she received services before [facility]. No current OP psych providers.  How Long Has This Been Causing You Problems? <Week  What Do You Feel Would Help You the Most Today? Alcohol or Drug Use Treatment   Have You Recently Had Any Thoughts About Hurting Yourself? No  Are You Planning to Commit Suicide/Harm Yourself At This time? No   Have you Recently Had Thoughts About Hurting Someone [name]? No  Are You Planning to Harm Someone at This Time? No  Explanation: No data recorded  Have You Used Any Alcohol or Drugs in the Past 24 Hours? Yes  How Long Ago Did You Use Drugs or Alcohol? No data recorded What Did You Use and How Much? crack cocaine over the last 2-3 days   Do You Currently Have a Therapist/Psychiatrist? No  Name of  Therapist/Psychiatrist: No data recorded  Have You Been Recently Discharged From Any Office Practice or Programs? No data recorded Explanation of Discharge From Practice/Program: No data recorded    CCA Screening Triage Referral Assessment Type of Contact: Tele-Assessment  Telemedicine Service Delivery:   Is this Initial or Reassessment? Initial Assessment  Date Telepsych consult ordered in [facility]:  12/12/21  Time Telepsych consult ordered in [facility]:  1700  Location of Assessment: [facility] ED  Provider Location: [facility] Assessment Services   Collateral Involvement: none allowed   Does Patient Have a Automotive engineer Guardian? No data recorded Name and Contact of Legal Guardian: No data recorded If Minor and Not Living with Parent(s), Who has Custody? No data recorded Is CPS involved or ever been involved? -- (uta)  Is APS involved or ever been involved? -- Rich Reining)   Patient Determined To Be At Risk for Harm To Self or Others Based on Review of Patient Reported Information or Presenting Complaint? No data recorded  Method: No data recorded Availability of Means: No data recorded Intent: No data recorded Notification Required: No data recorded Additional Information for Danger to Others Potential: No data recorded Additional Comments for Danger to Others Potential: No data recorded Are There Guns or Other Weapons in Your Home? No data recorded Types of Guns/Weapons: No data recorded Are These Weapons Safely Secured?                            No data recorded Who Could Verify You Are Able To Have These Secured: No data recorded Do You Have any Outstanding Charges, Pending Court Dates, Parole/Probation? No data recorded Contacted To Inform of Risk of Harm To Self or Others: No data recorded   Does Patient Present under Involuntary Commitment? No  IVC Papers Initial File Date: No data recorded  [place] of Residence: [place]   Patient Currently Receiving the Following Services:  No data recorded  Determination of Need: No data recorded  Options For Referral: No data recorded    CCA Biopsychosocial Patient Reported Schizophrenia/Schizoaffective Diagnosis in Past: No (None reported)   Strengths: uta   Mental Health Symptoms Depression:   Change in energy/activity; Difficulty Concentrating; Fatigue; Hopelessness; Irritability; Tearfulness   Duration of Depressive symptoms:  Duration of Depressive Symptoms: Less than two weeks   Mania:   None   Anxiety:    Worrying; Sleep; Restlessness   Psychosis:   Hallucinations (Some AVH reported in vague terms.)   Duration of Psychotic symptoms:  Duration of Psychotic Symptoms: Less than six months   Trauma:   None (None reported)   Obsessions:   None   Compulsions:   None   Inattention:   None   Hyperactivity/Impulsivity:   None   Oppositional/Defiant Behaviors:   None   Emotional Irregularity:   Mood lability; Potentially harmful impulsivity   Other Mood/Personality Symptoms:  No data recorded   Mental Status Exam Appearance and self-care  Stature:   Average   Weight:   Average weight   Clothing:   Disheveled   Grooming:   Neglected   Cosmetic use:   Age appropriate   Posture/gait:   Normal   Motor activity:   Restless; Slowed   Sensorium  Attention:   Distractible   Concentration:   Focuses on irrelevancies; Scattered   Orientation:   X5   Recall/memory:   -- (uta)   Affect and Mood  Affect:   Blunted   Mood:   Dysphoric; Pessimistic   Relating  Eye contact:   Fleeting (Pt continued to struggle to stay awake.)   Facial expression:   Depressed   Attitude toward examiner:   Cooperative; Dramatic   Thought and Language  Speech flow:  Garbled; Paucity; Slurred   Thought content:   Appropriate to Mood and Circumstances   Preoccupation:   None   Hallucinations:   Visual; Auditory (Vague report given)   Organization:  No data recorded   [employer] of Knowledge:   Average   Intelligence:   Average   Abstraction:   Functional   Judgement:   Poor   Reality Testing:   Adequate   Insight:   Flashes of insight; Poor   Decision Making:   Impulsive   Social Functioning  Social Maturity:   Impulsive   Social Judgement:   Heedless   Stress  Stressors:   Housing; Surveyor, quantity; Other (Comment) (Realpse into drug use)   Coping Ability:  Exhausted; Overwhelmed   Skill Deficits:   -- Rich Reining)   Supports:   Friends/Service system     Religion: Religion/Spirituality Are You A Religious Person?:  Industrial/product designer)  Leisure/Recreation: Leisure / Recreation Do You Have Hobbies?:  Rich Reining)  Exercise/Diet: Exercise/Diet Do You Exercise?:  (uta) Have You Gained or Lost A Significant Amount of Weight in the Past Six Months?:  (uta) Do You Follow a Special Diet?:  (uta) Do You Have Any Trouble Sleeping?:  (Pt reported difficulty sleeping and stated that she has taken Seroquel in the past which helped her sleep.)   CCA Employment/Education Employment/Work Situation: Employment / Work Situation Employment Situation: On disability (boyfriend supports, gets food stamps and wants disability) Has Patient ever Been in Equities trader?: No  Education: Education Is Patient Currently Attending School?: No Did You Have An Individualized Education Program (IIEP):  Rich Reining) Did You Have Any Difficulty At [employer]?:  (uta)   CCA Family/Childhood History Family and Relationship History: Family history Marital status: Single Does patient have children?: Yes How many children?: 3 (Ages 68, 29 and 35 yo)  Childhood History:  Childhood History By whom was/is the patient raised?: Grandparents Did patient suffer any verbal/emotional/physical/sexual abuse as a child?: No Has patient ever been sexually abused/assaulted/raped as an adolescent or adult?: No Witnessed domestic violence?: No Has patient been affected by  domestic violence as an adult?: No  Child/Adolescent Assessment:     CCA Substance Use Alcohol/Drug Use: Alcohol / Drug Use Pain Medications: SEE MAR Prescriptions: SEE MAR Over the Counter: SEE MAR History of alcohol / drug use?: Yes Longest period of sobriety (when/how long): 8 month per pt report Withdrawal Symptoms:  (Pt denies any) Substance #1 Name of Substance 1: crack cocaine 1 - Age of First Use: 2017 1 - Amount (size/oz): varies 1 - Frequency: daily for the past 2-3 days per pt report 1 - Duration: ongoing 1 - Last Use / Amount: yesterday 1 - Method of Aquiring: unknown 1- Route of Use: smoking Substance #2 Name of Substance 2: cannabis 2 - Age of First Use: teen 2 - Amount (size/oz): varies 2 - Frequency: "occasionally" 2 - Duration: ongoing 2 - Last Use / Amount: unknown 2 - Method of Aquiring: unknown 2 - Route of Substance Use: smoke                     ASAM's:  Six Dimensions of Multidimensional Assessment  Dimension 1:  Acute Intoxication and/or Withdrawal Potential:      Dimension 2:  Biomedical Conditions and Complications:      Dimension 3:  Emotional, Behavioral, or Cognitive Conditions and Complications:     Dimension 4:  Readiness to Change:     Dimension 5:  Relapse, Continued use, or Continued Problem Potential:     Dimension 6:  Recovery/Living Environment:     ASAM Severity Score:    ASAM Recommended Level of Treatment:     Substance use Disorder (SUD)    Recommendations for Services/Supports/Treatments:    Discharge Disposition:    DSM5 Diagnoses: Patient Active Problem List   Diagnosis Date Noted   CVA (cerebral vascular accident) (HCC) 09/06/2020   Left-sided weakness    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   Normocytic anemia 02/09/2020   Ischemic stroke (HCC) 01/01/2020   Acute encephalopathy 05/12/2018   Diabetes mellitus without complication (HCC) 12/06/2017   Hypokalemia 12/06/2017    Hypertension 12/06/2017   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 type 2 diabetes  mellitus (HCC) 07/19/2017   Bipolar disorder (HCC) 07/19/2017   Polysubstance abuse (HCC) 07/19/2017   Chest pain 07/19/2017   Cocaine use disorder, severe, dependence (HCC) 03/24/2015   Cannabis use disorder, moderate, dependence (HCC) 03/24/2015   MDD (major depressive disorder), recurrent severe, 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 Jimmye Norman, MS, [facility], [facility] Triage Specialist [facility]

## 2021-12-12 NOTE — ED Provider Notes (Signed)
[facility] EMERGENCY DEPARTMENT Provider Note   CSN: [id number] Arrival date & time: 12/12/21  1212     History  Chief Complaint  Patient presents with   Suicidal    Kaitlyn Gray is a 46 y.o. female.  HPI Patient is a 46 year old female with a past medical history significant for active cocaine use, major depressive disorder, polysubstance abuse, DM 2, HTN, hypokalemia, stroke with left-sided residual weakness.  Patient is tearful and states that she feels quite depressed and states that she wants to talk to a psychiatrist.  She states that she has had suicidal thoughts and has been cutting her wrists superficially.  She has no history of attempted suicide.  She endorses cocaine use but states she would like to stop using.  Denies any ingestion today.  No other issues today.    Home Medications Prior to Admission medications   Medication Sig Start Date End Date Taking? Authorizing Provider  amLODipine-benazepril (LOTREL) 5-10 MG capsule Take 1 capsule by mouth daily. 10/17/21   [provider]  aspirin 81 MG chewable tablet Chew 1 tablet (81 mg total) by mouth daily. 03/15/20   Carlisle Cater, PA-C  atorvastatin (LIPITOR) 80 MG tablet Take 1 tablet (80 mg total) by mouth daily. Patient not taking: Reported on 09/06/2020 03/15/20   Carlisle Cater, PA-C  blood glucose meter kit and supplies KIT Dispense based on patient and insurance preference. Use up to four times daily as directed. (FOR ICD-9 250.00, 250.01). Please give 3 months supply of lancets and test strips to allow her to check QID. 12/07/17   Roxan Hockey, MD  blood glucose meter kit and supplies Relion Prime or Dispense other brand based on patient and insurance preference. Use up to four times daily as directed. (FOR ICD-9 250.00, 250.01). 12/07/17   Roxan Hockey, MD  ferrous sulfate 325 (65 FE) MG tablet Take 1 tablet (325 mg total) by mouth daily. 01/20/20   Charlesetta Shanks, MD   gabapentin (NEURONTIN) 400 MG capsule Take 1 capsule (400 mg total) by mouth 2 (two) times daily. 09/13/20   Nita Sells, MD  glipiZIDE (GLUCOTROL XL) 10 MG 24 hr tablet Take 10 mg by mouth daily. 10/17/21   [provider]  hydrOXYzine (ATARAX/VISTARIL) 25 MG tablet Take 0.5 tablets (12.5 mg total) by mouth every 6 (six) hours as needed for anxiety or itching. 09/13/20   Nita Sells, MD  Insulin Pen Needle 31G X 5 MM MISC Use as directed 12/07/17   Roxan Hockey, MD  lisinopril (ZESTRIL) 10 MG tablet Take 2 tablets (20 mg total) by mouth daily. 03/20/21 04/19/21  Wyvonnia Dusky, MD  metFORMIN (GLUCOPHAGE) 1000 MG tablet Take 1 tablet (1,000 mg total) by mouth 2 (two) times daily with a meal. Patient not taking: Reported on 09/06/2020 01/20/20   Charlesetta Shanks, MD  QUEtiapine (SEROQUEL) 200 MG tablet Take 200 mg by mouth at bedtime. 08/31/20   [provider]  SUBOXONE 8-2 MG FILM Place 1 Film under the tongue in the morning, at noon, and at bedtime. 09/13/20   Nita Sells, MD      Allergies    Hydrocodone and Latex    Review of Systems   Review of Systems  Physical Exam Updated Vital Signs BP (!) 162/78 (BP Location: Left Arm)   Pulse 66   Temp 98.5 F (36.9 C) (Oral)   Resp 18   SpO2 97%  Physical Exam Vitals and nursing note reviewed.  Constitutional:  General: She is not in acute distress.    Appearance: She is obese.  HENT:     Head: Normocephalic and atraumatic.     Nose: Nose normal.  Eyes:     General: No scleral icterus. Cardiovascular:     Rate and Rhythm: Normal rate and regular rhythm.     Pulses: Normal pulses.     Heart sounds: Normal heart sounds.  Pulmonary:     Effort: Pulmonary effort is normal. No respiratory distress.     Breath sounds: No wheezing.  Abdominal:     Palpations: Abdomen is soft.     Tenderness: There is no abdominal tenderness.  Musculoskeletal:     Cervical back: Normal range of motion.      Right lower leg: No edema.     Left lower leg: No edema.  Skin:    General: Skin is warm and dry.     Capillary Refill: Capillary refill takes less than 2 seconds.  Neurological:     Mental Status: She is alert. Mental status is at baseline.  Psychiatric:        Mood and Affect: Mood normal.        Behavior: Behavior normal.     ED Results / Procedures / Treatments   Labs (all labs ordered are listed, but only abnormal results are displayed) Labs Reviewed  COMPREHENSIVE METABOLIC PANEL - Abnormal; Notable for the following components:      Result Value   Potassium 3.0 (*)    Glucose, Bld 130 (*)    Calcium 8.7 (*)    All other components within normal limits  SALICYLATE LEVEL - Abnormal; Notable for the following components:   Salicylate Lvl <4.7 (*)    All other components within normal limits  ACETAMINOPHEN LEVEL - Abnormal; Notable for the following components:   Acetaminophen (Tylenol), Serum <10 (*)    All other components within normal limits  CBC - Abnormal; Notable for the following components:   Hemoglobin 8.5 (*)    HCT 30.0 (*)    MCV 72.5 (*)    MCH 20.5 (*)    MCHC 28.3 (*)    RDW 18.4 (*)    Platelets 421 (*)    All other components within normal limits  ETHANOL  RAPID URINE DRUG SCREEN, HOSP PERFORMED  I-STAT BETA HCG BLOOD, ED ([facility], [facility], [facility] ONLY)    EKG None  Radiology No results found.  Procedures Procedures    Medications Ordered in ED Medications  potassium chloride SA (KLOR-CON M) CR tablet 40 mEq (has no administration in time range)    ED Course/ Medical Decision Making/ A&P Clinical Course as of 12/12/21 1540  Thu Dec 12, 2021  1404 Hemoglobin(!): 8.5 Chronic anemia no change from prior. [WF]  1404 Potassium(!): 3.0 Seems to have a history of hypokalemia.  Will replete here [WF]  1404 Patient medically cleared at this time.  Consult to TTS placed  [WF]  1517 Not IVC [BH]    Clinical Course User Index [BH] Henderly, Britni A,  PA-C [WF] Tedd Sias, PA                           Medical Decision Making Amount and/or Complexity of Data Reviewed Labs: ordered. Decision-making details documented in ED Course.  Risk Prescription drug management.   Patient is a 46 year old female with a past medical history significant for active cocaine use, major depressive disorder, polysubstance abuse, DM  2, HTN, hypokalemia, stroke with left-sided residual weakness.  Patient is tearful and states that she feels quite depressed and states that she wants to talk to a psychiatrist.  She states that she has had suicidal thoughts and has been cutting her wrists superficially.  She has no history of attempted suicide.  She endorses cocaine use but states she would like to stop using.  Denies any ingestion today.  No other issues today.    PE: Unremarkable.   Labs unremarkable/at baseline.  Patient is medically clear at this time.  Will need to be evaluated by TTS.  She is not under IVC and is voluntary at this time.    Final Clinical Impression(s) / ED Diagnoses Final diagnoses:  Suicidal ideation  At risk for intentional self-harm    Rx / DC Orders ED Discharge Orders     None         Tedd Sias, [place] 12/12/21 1552    Margette Fast, MD 12/13/21 [phone]

## 2021-12-12 NOTE — ED Notes (Signed)
Pt remains very sleepy and difficult to arouse after multiple attempts. Informed accepting nurse in purple zone that pt still needs to be changed and wanded.

## 2021-12-12 NOTE — ED Notes (Signed)
Attempted to awaken pt to give ordered medications but pt is sleeping soundly and was not easily arousable. Will try again later. Safety sitter present at bedside.

## 2021-12-13 DIAGNOSIS — F332 Major depressive disorder, recurrent severe without psychotic features: Secondary | ICD-10-CM

## 2021-12-13 NOTE — ED Notes (Signed)
TTS at bedside to assess.

## 2021-12-13 NOTE — BH Assessment (Signed)
[facility] Assessment Progress Note   Per Eligha Bridegroom, NP, this pt does not require psychiatric hospitalization at this time.  Pt is psychiatrically cleared.  Discharge instructions include referral information for [facility].  Pt's nurse, Phineas Douglas, has been notified.  Doylene Canning, MA Triage Specialist [phone]

## 2021-12-13 NOTE — Discharge Instructions (Addendum)
For your behavioral health needs you are advised to follow up with [facility] at your earliest opportunity:      [facility]      [address] SECOND FLOOR      [place], [place] [address]      [phone]      They offer psychiatry/medication management and therapy.  New patients are seen in their walk-in clinic.  Walk-in hours are Monday, Wednesday, Thursday and Friday from 8:00 am - 11:00 am for psychiatry, and Monday and Wednesday from 8:00 am - 11:00 am for therapy.  Walk-in patients are seen on a first come, first served basis, so try to arrive as early as possible for the best chance of being seen the same day.  BE SURE TO TAKE THE ELEVATOR TO THE SECOND FLOOR.  Please note that to be eligible for services you must bring an ID or a piece of mail with your name and a [place] address.  

## 2021-12-13 NOTE — ED Notes (Signed)
Cab voucher given at pt reports her gait is abnormal due to previous strokes
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
## 2021-12-13 NOTE — ED Provider Notes (Addendum)
Emergency Medicine Observation Re-evaluation Note  Kaitlyn Gray is a 46 y.o. female, seen on rounds today.  Pt initially presented to the ED for complaints of Suicidal Currently, the patient is sleeping.  Physical Exam  BP (!) 159/95 (BP Location: Right Arm)   Pulse 70   Temp 98 F (36.7 C) (Oral)   Resp 18   SpO2 98%  Physical Exam General: No distress Cardiac: Well-perfused Lungs: Clear lungs Psych: Deferred  ED Course / MDM  EKG:   I have reviewed the labs performed to date as well as medications administered while in observation.  Recent changes in the last 24 hours include awaiting placement.  Plan  Current plan is for awaiting placement.  Kaitlyn Gray is not under involuntary commitment.  Patient has been cleared for discharge by behavioral health.  She denies any suicidal or homicidal thoughts.  Resources are given    Glynn Octave, MD 12/13/21 8206    Glynn Octave, MD 12/13/21 1233

## 2021-12-13 NOTE — ED Notes (Signed)
Pt has showered  

## 2021-12-17 ENCOUNTER — Emergency Department (HOSPITAL_COMMUNITY): Payer: Medicaid Other

## 2021-12-17 ENCOUNTER — Encounter (HOSPITAL_COMMUNITY): Payer: Self-pay

## 2021-12-17 ENCOUNTER — Other Ambulatory Visit: Payer: Self-pay

## 2021-12-17 ENCOUNTER — Emergency Department (HOSPITAL_COMMUNITY)
Admission: EM | Admit: 2021-12-17 | Discharge: 2021-12-17 | Disposition: A | Discharge status: Home / Self Care | Payer: Medicaid Other | Attending: Emergency Medicine | Admitting: Emergency Medicine

## 2021-12-17 DIAGNOSIS — W19XXXA Unspecified fall, initial encounter: Secondary | ICD-10-CM | POA: Insufficient documentation

## 2021-12-17 DIAGNOSIS — Z79899 Other long term (current) drug therapy: Secondary | ICD-10-CM | POA: Diagnosis not present

## 2021-12-17 DIAGNOSIS — F32A Depression, unspecified: Secondary | ICD-10-CM | POA: Diagnosis not present

## 2021-12-17 DIAGNOSIS — S8991XA Unspecified injury of right lower leg, initial encounter: Secondary | ICD-10-CM | POA: Diagnosis present

## 2021-12-17 DIAGNOSIS — Z91040 Latex allergy status: Secondary | ICD-10-CM | POA: Diagnosis not present

## 2021-12-17 DIAGNOSIS — Z7982 Long term (current) use of aspirin: Secondary | ICD-10-CM | POA: Diagnosis not present

## 2021-12-17 DIAGNOSIS — M25561 Pain in right knee: Secondary | ICD-10-CM

## 2021-12-17 DIAGNOSIS — Z794 Long term (current) use of insulin: Secondary | ICD-10-CM | POA: Diagnosis not present

## 2021-12-17 MED ORDER — ACETAMINOPHEN 325 MG PO TABS
650.0000 mg | ORAL_TABLET | Freq: Once | ORAL | Status: AC
  Administered 2021-12-17: 650 mg via ORAL
  Filled 2021-12-17: qty 2
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
## 2021-12-17 NOTE — ED Notes (Signed)
Patient requesting transport at discharge. Offered patient a bus pass. She became tearful saying she was going to leave and hurt herself. Dr. Audley Hose made aware.

## 2021-12-17 NOTE — ED Notes (Signed)
Patient requesting discharge paperwork and food.

## 2021-12-17 NOTE — ED Notes (Addendum)
Patient ambulatory to nurse's station with steady gait with walker requesting food.

## 2021-12-17 NOTE — ED Notes (Addendum)
Upon asking patient what size scrubs she would like, patient noted to be on phone asking for person on the line to provide her a ride. Dr. Audley Hose called to speak with patient.
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
## 2021-12-17 NOTE — ED Notes (Signed)
Patient given crackers, cheese, and soda.

## 2021-12-26 ENCOUNTER — Emergency Department (HOSPITAL_COMMUNITY): Payer: Medicaid Other

## 2021-12-26 ENCOUNTER — Emergency Department (HOSPITAL_COMMUNITY)
Admission: EM | Admit: 2021-12-26 | Discharge: 2021-12-26 | Disposition: A | Discharge status: Home / Self Care | Payer: Medicaid Other | Attending: Emergency Medicine | Admitting: Emergency Medicine

## 2021-12-26 ENCOUNTER — Encounter (HOSPITAL_COMMUNITY): Payer: Self-pay | Admitting: Emergency Medicine

## 2021-12-26 ENCOUNTER — Other Ambulatory Visit: Payer: Self-pay

## 2021-12-26 DIAGNOSIS — W1839XA Other fall on same level, initial encounter: Secondary | ICD-10-CM | POA: Diagnosis not present

## 2021-12-26 DIAGNOSIS — Z794 Long term (current) use of insulin: Secondary | ICD-10-CM | POA: Insufficient documentation

## 2021-12-26 DIAGNOSIS — S2231XA Fracture of one rib, right side, initial encounter for closed fracture: Secondary | ICD-10-CM | POA: Diagnosis not present

## 2021-12-26 DIAGNOSIS — W19XXXA Unspecified fall, initial encounter: Secondary | ICD-10-CM

## 2021-12-26 DIAGNOSIS — M546 Pain in thoracic spine: Secondary | ICD-10-CM | POA: Diagnosis not present

## 2021-12-26 DIAGNOSIS — Z91040 Latex allergy status: Secondary | ICD-10-CM | POA: Diagnosis not present

## 2021-12-26 DIAGNOSIS — Z7982 Long term (current) use of aspirin: Secondary | ICD-10-CM | POA: Insufficient documentation

## 2021-12-26 DIAGNOSIS — S29001A Unspecified injury of muscle and tendon of front wall of thorax, initial encounter: Secondary | ICD-10-CM | POA: Diagnosis present

## 2021-12-26 DIAGNOSIS — E119 Type 2 diabetes mellitus without complications: Secondary | ICD-10-CM | POA: Diagnosis not present

## 2021-12-26 LAB — BASIC METABOLIC PANEL
Anion gap: 9 (ref 5–15)
BUN: 11 mg/dL (ref 6–20)
CO2: 25 mmol/L (ref 22–32)
Calcium: 8.6 mg/dL — ABNORMAL LOW (ref 8.9–10.3)
Chloride: 103 mmol/L (ref 98–111)
Creatinine, Ser: 0.72 mg/dL (ref 0.44–1.00)
GFR, Estimated: >60 mL/min (ref >60)
Glucose, Bld: 130 mg/dL — ABNORMAL HIGH (ref 70–99)
Potassium: 3.4 mmol/L — ABNORMAL LOW (ref 3.5–5.1)
Sodium: 137 mmol/L (ref 135–145)

## 2021-12-26 LAB — CBC
HCT: 28.7 % — ABNORMAL LOW (ref 36.0–46.0)
Hemoglobin: 8.1 g/dL — ABNORMAL LOW (ref 12.0–15.0)
MCH: 20.8 pg — ABNORMAL LOW (ref 26.0–34.0)
MCHC: 28.2 g/dL — ABNORMAL LOW (ref 30.0–36.0)
MCV: 73.8 fL — ABNORMAL LOW (ref 80.0–100.0)
Platelets: 360 10*3/uL (ref 150–400)
RBC: 3.89 MIL/uL (ref 3.87–5.11)
RDW: 19.3 % — ABNORMAL HIGH (ref 11.5–15.5)
WBC: 5 10*3/uL (ref 4.0–10.5)
nRBC: 0 % (ref 0.0–0.2)

## 2021-12-26 LAB — I-STAT BETA HCG BLOOD, ED (MC, WL, AP ONLY): I-stat hCG, quantitative: <5 m[IU]/mL (ref <5)

## 2021-12-26 LAB — TROPONIN I (HIGH SENSITIVITY): Troponin I (High Sensitivity): 5 ng/L (ref <18)

## 2021-12-26 MED ORDER — OXYCODONE-ACETAMINOPHEN 5-325 MG PO TABS
1.0000 | ORAL_TABLET | Freq: Once | ORAL | Status: AC
  Administered 2021-12-26: 1 via ORAL
  Filled 2021-12-26: qty 1

## 2021-12-26 MED ORDER — KETOROLAC TROMETHAMINE 30 MG/ML IJ SOLN: 30.0000 mg | Freq: Once | INTRAMUSCULAR | Status: DC

## 2021-12-26 NOTE — ED Notes (Signed)
Pt refuse EKG, nurse notified

## 2021-12-26 NOTE — ED Notes (Signed)
Pt refused vitals 
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
## 2021-12-26 NOTE — ED Notes (Signed)
Patient refused vitals prior to being discharged.

## 2021-12-26 NOTE — Discharge Instructions (Signed)
You have been diagnosed with a rib fracture. Typically, these heal on their own. The most important thing is pain control. You can use Ibuprofen and Tylenol at home as needed.   Please return if you start developing shortness of breath, confusion, or worsening pain

## 2021-12-26 NOTE — ED Notes (Signed)
Pt refused vitals. Pt stated "Im fine"
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
## 2021-12-26 NOTE — ED Notes (Signed)
Hospital security to escort patient out at this time due to patient refusing to leave. Bus pass provided to patient prior to leaving department.

## 2021-12-26 NOTE — ED Notes (Signed)
This RN discharging patient at this time and patient refusing to leave at this time. Patient states that she cannot take the bus due to being in so much pain. Patient ambulated to bathroom earlier with no issues. This RN verbalized to patient that she is medically cleared to be discharged and is stable to take the bus home. Provider made aware of same and states patient is stable to ride the bus.

## 2022-01-27 ENCOUNTER — Emergency Department (HOSPITAL_COMMUNITY): Payer: Medicaid Other

## 2022-01-27 ENCOUNTER — Emergency Department (HOSPITAL_COMMUNITY)
Admission: EM | Admit: 2022-01-27 | Discharge: 2022-01-27 | Disposition: A | Discharge status: Home / Self Care | Payer: Medicaid Other | Attending: Emergency Medicine | Admitting: Emergency Medicine

## 2022-01-27 DIAGNOSIS — Z8673 Personal history of transient ischemic attack (TIA), and cerebral infarction without residual deficits: Secondary | ICD-10-CM | POA: Insufficient documentation

## 2022-01-27 DIAGNOSIS — Z7984 Long term (current) use of oral hypoglycemic drugs: Secondary | ICD-10-CM | POA: Insufficient documentation

## 2022-01-27 DIAGNOSIS — I10 Essential (primary) hypertension: Secondary | ICD-10-CM | POA: Insufficient documentation

## 2022-01-27 DIAGNOSIS — D509 Iron deficiency anemia, unspecified: Secondary | ICD-10-CM | POA: Insufficient documentation

## 2022-01-27 DIAGNOSIS — N179 Acute kidney failure, unspecified: Secondary | ICD-10-CM | POA: Diagnosis not present

## 2022-01-27 DIAGNOSIS — R531 Weakness: Secondary | ICD-10-CM | POA: Insufficient documentation

## 2022-01-27 DIAGNOSIS — E119 Type 2 diabetes mellitus without complications: Secondary | ICD-10-CM | POA: Insufficient documentation

## 2022-01-27 DIAGNOSIS — R7989 Other specified abnormal findings of blood chemistry: Secondary | ICD-10-CM | POA: Insufficient documentation

## 2022-01-27 DIAGNOSIS — Z91040 Latex allergy status: Secondary | ICD-10-CM | POA: Diagnosis not present

## 2022-01-27 DIAGNOSIS — R29898 Other symptoms and signs involving the musculoskeletal system: Secondary | ICD-10-CM | POA: Diagnosis not present

## 2022-01-27 HISTORY — DX: Weakness: R53.1

## 2022-01-27 LAB — I-STAT CHEM 8, ED
BUN: 10 mg/dL (ref 6–20)
Calcium, Ion: 1.1 mmol/L — ABNORMAL LOW (ref 1.15–1.40)
Chloride: 103 mmol/L (ref 98–111)
Creatinine, Ser: 0.8 mg/dL (ref 0.44–1.00)
Glucose, Bld: 288 mg/dL — ABNORMAL HIGH (ref 70–99)
HCT: 27 % — ABNORMAL LOW (ref 36.0–46.0)
Hemoglobin: 9.2 g/dL — ABNORMAL LOW (ref 12.0–15.0)
Potassium: 3.6 mmol/L (ref 3.5–5.1)
Sodium: 140 mmol/L (ref 135–145)
TCO2: 20 mmol/L — ABNORMAL LOW (ref 22–32)

## 2022-01-27 LAB — RAPID URINE DRUG SCREEN, HOSP PERFORMED
Amphetamines: NOT DETECTED
Barbiturates: NOT DETECTED
Benzodiazepines: NOT DETECTED
Cocaine: POSITIVE — AB
Opiates: NOT DETECTED
Tetrahydrocannabinol: NOT DETECTED

## 2022-01-27 LAB — COMPREHENSIVE METABOLIC PANEL
ALT: 13 U/L (ref 0–44)
AST: 19 U/L (ref 15–41)
Albumin: 3.3 g/dL — ABNORMAL LOW (ref 3.5–5.0)
Alkaline Phosphatase: 71 U/L (ref 38–126)
Anion gap: 9 (ref 5–15)
BUN: 11 mg/dL (ref 6–20)
CO2: 22 mmol/L (ref 22–32)
Calcium: 8.7 mg/dL — ABNORMAL LOW (ref 8.9–10.3)
Chloride: 108 mmol/L (ref 98–111)
Creatinine, Ser: 1.05 mg/dL — ABNORMAL HIGH (ref 0.44–1.00)
GFR, Estimated: >60 mL/min (ref >60)
Glucose, Bld: 292 mg/dL — ABNORMAL HIGH (ref 70–99)
Potassium: 3.6 mmol/L (ref 3.5–5.1)
Sodium: 139 mmol/L (ref 135–145)
Total Bilirubin: 0.2 mg/dL — ABNORMAL LOW (ref 0.3–1.2)
Total Protein: 6.6 g/dL (ref 6.5–8.1)

## 2022-01-27 LAB — CBC
HCT: 27.5 % — ABNORMAL LOW (ref 36.0–46.0)
Hemoglobin: 8 g/dL — ABNORMAL LOW (ref 12.0–15.0)
MCH: 21.7 pg — ABNORMAL LOW (ref 26.0–34.0)
MCHC: 29.1 g/dL — ABNORMAL LOW (ref 30.0–36.0)
MCV: 74.5 fL — ABNORMAL LOW (ref 80.0–100.0)
Platelets: 308 10*3/uL (ref 150–400)
RBC: 3.69 MIL/uL — ABNORMAL LOW (ref 3.87–5.11)
RDW: 20 % — ABNORMAL HIGH (ref 11.5–15.5)
WBC: 4.6 10*3/uL (ref 4.0–10.5)
nRBC: 0 % (ref 0.0–0.2)

## 2022-01-27 LAB — DIFFERENTIAL
Abs Immature Granulocytes: 0.01 10*3/uL (ref 0.00–0.07)
Basophils Absolute: 0 10*3/uL (ref 0.0–0.1)
Basophils Relative: 1 %
Eosinophils Absolute: 0.1 10*3/uL (ref 0.0–0.5)
Eosinophils Relative: 1 %
Immature Granulocytes: 0 %
Lymphocytes Relative: 32 %
Lymphs Abs: 1.5 10*3/uL (ref 0.7–4.0)
Monocytes Absolute: 0.3 10*3/uL (ref 0.1–1.0)
Monocytes Relative: 7 %
Neutro Abs: 2.7 10*3/uL (ref 1.7–7.7)
Neutrophils Relative %: 59 %

## 2022-01-27 LAB — PROTIME-INR
INR: 1.1 (ref 0.8–1.2)
Prothrombin Time: 13.9 seconds (ref 11.4–15.2)

## 2022-01-27 LAB — URINALYSIS, ROUTINE W REFLEX MICROSCOPIC
Bilirubin Urine: NEGATIVE
Glucose, UA: NEGATIVE mg/dL
Hgb urine dipstick: NEGATIVE
Ketones, ur: NEGATIVE mg/dL
Leukocytes,Ua: NEGATIVE
Nitrite: NEGATIVE
Protein, ur: NEGATIVE mg/dL
Specific Gravity, Urine: 1.01 (ref 1.005–1.030)
pH: 7 (ref 5.0–8.0)

## 2022-01-27 LAB — TROPONIN I (HIGH SENSITIVITY): Troponin I (High Sensitivity): 7 ng/L (ref <18)

## 2022-01-27 LAB — I-STAT BETA HCG BLOOD, ED (MC, WL, AP ONLY): I-stat hCG, quantitative: <5 m[IU]/mL (ref <5)

## 2022-01-27 LAB — CBG MONITORING, ED: Glucose-Capillary: 278 mg/dL — ABNORMAL HIGH (ref 70–99)

## 2022-01-27 LAB — ETHANOL: Alcohol, Ethyl (B): <10 mg/dL (ref <10)

## 2022-01-27 LAB — APTT: aPTT: 27 seconds (ref 24–36)

## 2022-01-27 MED ORDER — LORAZEPAM 2 MG/ML IJ SOLN
1.0000 mg | Freq: Once | INTRAMUSCULAR | Status: AC
  Administered 2022-01-27: 1 mg via INTRAVENOUS
  Filled 2022-01-27: qty 1

## 2022-01-27 MED ORDER — ASPIRIN 81 MG PO CHEW
162.0000 mg | CHEWABLE_TABLET | Freq: Once | ORAL | Status: AC
Start: 2022-01-27 — End: 2022-01-27
  Administered 2022-01-27: 162 mg via ORAL
  Filled 2022-01-27: qty 2

## 2022-01-27 MED ORDER — SODIUM CHLORIDE 0.9% FLUSH: 3.0000 mL | Freq: Once | INTRAVENOUS | Status: DC

## 2022-01-27 MED ORDER — LACTATED RINGERS BOLUS PEDS
1000.0000 mL | Freq: Once | INTRAVENOUS | Status: AC
  Administered 2022-01-27: 1000 mL via INTRAVENOUS

## 2022-01-27 NOTE — ED Notes (Signed)
Patient transported to MRI 

## 2022-01-27 NOTE — Discharge Instructions (Signed)
You were seen in the emergency department for your weakness.  You had no signs of a new stroke.  You had no signs of worsening anemia, dehydration or abnormal electrolytes.  We did test her urine for UTI but you did not want to wait for your results.  You can follow-up these results on MyChart and follow-up in the primary care clinic.  You will receive a call if that is abnormal and you need to start antibiotics.  You should return to the emergency department if you have new or worsening weakness, change in speech, you pass out, or if you have any other new or concerning symptoms.

## 2022-01-27 NOTE — ED Notes (Signed)
RN reviewed discharge instructions with pt. Pt verbalized understanding and had no further questions 

## 2022-01-27 NOTE — ED Notes (Signed)
This RN attempted to assist pt to bathroom. Pt refused, stating she wanted her IV taken out first or she would not go. This RN educated pt on the use of the IV, pt continued to refuse, stating she wants to leave without obtaining the urine sample. Kneller DO made aware

## 2022-01-27 NOTE — ED Triage Notes (Signed)
Pt BIB [facility] as Code Stroke.  PT has hx of stroke with previous speech and L. Sided deficits.  PT endorses difference in speech and increased weakness, numbness, tingling on whole left side.  EMS endorses familiarity with pt and states speech seems similar to previous encounters.  Pt was anxious in CT and is known to have this issue at times.

## 2022-01-27 NOTE — ED Notes (Signed)
Pt given sandwich and water, this RN encouraged pt to go to the bathroom for urine sample, pt stated she doesn't have to go but will try in 15 minutes

## 2022-01-27 NOTE — Code Documentation (Signed)
Stroke Response Nurse Documentation Code Documentation  Kaitlyn Gray is a 46 y.o. female arriving to [facility]  via [facility] on 01/27/2022 with past medical hx of stroke, bipolar1 disorder, HTN, and Diabetes. Code stroke was activated by EMS.   Patient from home where she was LKW at 1300 and now complaining of worsening left sided weakness. Pt has history of stroke with left sided deficits. Reports that the symptoms became worse at 1300.   Stroke team at the bedside on patient arrival. Labs drawn and patient cleared for CT. Patient to CT with team. NIHSS 8, see documentation for details and code stroke times. Patient with disoriented, left arm weakness, left leg weakness, and left decreased sensation on exam. The following imaging was completed:  CT Head. Patient is not a candidate for IV Thrombolytic due to stroke not suspected. Patient is not a candidate for IR due to no LVO suspected.   Care Plan: MRI and q2 NIHSS/VS.   Bedside handoff with ED RN Josh.    Lucila Maine  Stroke Response RN

## 2022-01-27 NOTE — ED Provider Notes (Signed)
[facility] EMERGENCY DEPARTMENT Provider Note   CSN: [id number] Arrival date & time: 01/27/22  1459  An emergency department physician performed an initial assessment on this suspected stroke patient at 1459.  History  No chief complaint on file.   Kaitlyn Gray is a 46 y.o. female.  With PMH of DM 2, HTN, polysubstance abuse, bipolar disorder, depression, CVA with left-sided weakness presenting as code stroke for left-sided weakness and left facial droop. LKN 1:00 PM today.  Blood glucose 220 with EMS and blood pressure 190/90 with EMS.  She has had no recent injury, falls, headache, chest pain, shortness of breath.  No fevers or neck pain.  Not on anticoagulation.  HPI     Home Medications Prior to Admission medications   Medication Sig Start Date End Date Taking? Authorizing Provider  amLODipine-benazepril (LOTREL) 5-10 MG capsule Take 1 capsule by mouth daily. 10/17/21   [provider]  aspirin 81 MG chewable tablet Chew 1 tablet (81 mg total) by mouth daily. 03/15/20   Carlisle Cater, PA-C  atorvastatin (LIPITOR) 80 MG tablet Take 1 tablet (80 mg total) by mouth daily. Patient not taking: Reported on 09/06/2020 03/15/20   Carlisle Cater, PA-C  blood glucose meter kit and supplies KIT Dispense based on patient and insurance preference. Use up to four times daily as directed. (FOR ICD-9 250.00, 250.01). Please give 3 months supply of lancets and test strips to allow her to check QID. 12/07/17   Roxan Hockey, MD  blood glucose meter kit and supplies Relion Prime or Dispense other brand based on patient and insurance preference. Use up to four times daily as directed. (FOR ICD-9 250.00, 250.01). 12/07/17   Roxan Hockey, MD  ferrous sulfate 325 (65 FE) MG tablet Take 1 tablet (325 mg total) by mouth daily. 01/20/20   Charlesetta Shanks, MD  gabapentin (NEURONTIN) 400 MG capsule Take 1 capsule (400 mg total) by mouth 2 (two) times daily. 09/13/20   Nita Sells, MD  glipiZIDE (GLUCOTROL XL) 10 MG 24 hr tablet Take 10 mg by mouth daily. 10/17/21   [provider]  hydrOXYzine (ATARAX/VISTARIL) 25 MG tablet Take 0.5 tablets (12.5 mg total) by mouth every 6 (six) hours as needed for anxiety or itching. 09/13/20   Nita Sells, MD  Insulin Pen Needle 31G X 5 MM MISC Use as directed 12/07/17   Roxan Hockey, MD  lisinopril (ZESTRIL) 10 MG tablet Take 2 tablets (20 mg total) by mouth daily. 03/20/21 04/19/21  Wyvonnia Dusky, MD  metFORMIN (GLUCOPHAGE) 1000 MG tablet Take 1 tablet (1,000 mg total) by mouth 2 (two) times daily with a meal. Patient not taking: Reported on 09/06/2020 01/20/20   Charlesetta Shanks, MD  QUEtiapine (SEROQUEL) 200 MG tablet Take 200 mg by mouth at bedtime. 08/31/20   [provider]  SUBOXONE 8-2 MG FILM Place 1 Film under the tongue in the morning, at noon, and at bedtime. 09/13/20   Nita Sells, MD      Allergies    Hydrocodone and Latex    Review of Systems   Review of Systems  Physical Exam Updated Vital Signs BP (!) 163/84   Pulse 66   Temp 97.8 F (36.6 C) (Oral)   Resp 20   SpO2 100%  Physical Exam Constitutional: Alert and oriented.  Chronically ill in appearance and slightly disheveled but nontoxic, NAD eyes: Conjunctivae are normal. ENT      Head: Normocephalic and atraumatic.  Nose: No congestion.      Mouth/Throat: Mucous membranes are moist.      Neck: No stridor. Cardiovascular: S1, S2,  Normal and symmetric distal pulses are present in all extremities.Warm and well perfused. Respiratory: Normal respiratory effort.  O2 sat 100 on RA.  Gastrointestinal: Soft and nontender.  Musculoskeletal:       Right lower leg: No tenderness or edema.      Left lower leg: No tenderness or edema. Neurologic: Normal speech and language.  Will not lift left arm or left leg up against gravity.  Full strength of right arm and right lower extremity.  Sensation intact on right side,  reporting decrease sensation left side.  Mild left facial droop, forehead sparing present.   Skin: Skin is warm, dry and intact. No rash noted. Psychiatric: Mood and affect are normal. Speech and behavior are normal.  ED Results / Procedures / Treatments   Labs (all labs ordered are listed, but only abnormal results are displayed) Labs Reviewed  CBC - Abnormal; Notable for the following components:      Result Value   RBC 3.69 (*)    Hemoglobin 8.0 (*)    HCT 27.5 (*)    MCV 74.5 (*)    MCH 21.7 (*)    MCHC 29.1 (*)    RDW 20.0 (*)    All other components within normal limits  COMPREHENSIVE METABOLIC PANEL - Abnormal; Notable for the following components:   Glucose, Bld 292 (*)    Creatinine, Ser 1.05 (*)    Calcium 8.7 (*)    Albumin 3.3 (*)    Total Bilirubin 0.2 (*)    All other components within normal limits  I-STAT CHEM 8, ED - Abnormal; Notable for the following components:   Glucose, Bld 288 (*)    Calcium, Ion 1.10 (*)    TCO2 20 (*)    Hemoglobin 9.2 (*)    HCT 27.0 (*)    All other components within normal limits  CBG MONITORING, ED - Abnormal; Notable for the following components:   Glucose-Capillary 278 (*)    All other components within normal limits  PROTIME-INR  APTT  DIFFERENTIAL  ETHANOL  URINALYSIS, ROUTINE W REFLEX MICROSCOPIC  RAPID URINE DRUG SCREEN, HOSP PERFORMED  I-STAT BETA HCG BLOOD, ED (MC, [facility], [facility] ONLY)  TROPONIN I (HIGH SENSITIVITY)  TROPONIN I (HIGH SENSITIVITY)    EKG EKG Interpretation  Date/Time:  Monday January 27 2022 15:21:20 EDT Ventricular Rate:  82 PR Interval:  149 QRS Duration: 107 QT Interval:  413 QTC Calculation: 483 R Axis:   39 Text Interpretation: Sinus rhythm Probable left atrial enlargement Borderline T abnormalities, anterior leads Minimal ST elevation, anterior leads Confirmed by Georgina Snell [phone]) on 01/27/2022 3:26:25 PM  Radiology CT HEAD CODE STROKE WO CONTRAST  Result Date: 01/27/2022 CLINICAL  DATA:  Code stroke.  Neuro deficit, acute, stroke suspected EXAM: CT HEAD WITHOUT CONTRAST TECHNIQUE: Contiguous axial images were obtained from the base of the skull through the vertex without intravenous contrast. RADIATION DOSE REDUCTION: This exam was performed according to the departmental dose-optimization program which includes automated exposure control, adjustment of the mA and/or kV according to patient size and/or use of iterative reconstruction technique. COMPARISON:  CT head Oct 16, 2021. FINDINGS: Brain: No evidence of acute large vascular territory infarction, hemorrhage, hydrocephalus, extra-axial collection or mass lesion/mass effect. Remote right occipital infarct. Chronic infarct in the right pons and brachium pontis better seen on prior MRI. Similar chronic  microvascular ischemic disease and cerebral atrophy. Vascular: No hyperdense vessel identified. Skull: No acute fracture. Sinuses/Orbits: Remote right medial orbital wall fracture. Frothy secretions in the left sphenoid sinus. Other: No mastoid effusions. ASPECTS [facility] Stroke Program Early CT Score) total score (0-10 with 10 being normal): 10. IMPRESSION: 1. No evidence of acute intracranial abnormality. ASPECTS is 10. 2. Remote right occipital infarct. Code stroke imaging results were communicated on 01/27/2022 at 3:14 pm to provider [facility] via secure text paging. Electronically Signed   By: Margaretha Sheffield M.D.   On: 01/27/2022 15:15    Procedures Procedures  Remained on constant cardiac monitoring, normal sinus rhythm.  Medications Ordered in ED Medications  sodium chloride flush (NS) 0.9 % injection 3 mL (has no administration in time range)  aspirin chewable tablet 162 mg (has no administration in time range)  lactated ringers bolus PEDS (has no administration in time range)  LORazepam (ATIVAN) injection 1 mg (1 mg Intravenous Given 01/27/22 1635)    ED Course/ Medical Decision Making/ A&P Clinical Course as of  01/27/22 1704  Mon Jan 27, 2022  1625 Patient CT head shows evidence of remote right occipital infarct which is consistent with old left-sided weakness.  No ICH.  Alcohol negative.  CBC with stable microcytic anemia hemoglobin 8.0.  Normal white blood cell count 4.6.  Glucose 292 with no anion gap and normal bicarbonate, no concern for DKA.  Creatinine slightly elevated at 1.05.  Ordered for IV fluids for mild AKI.  EKG with mild ST change in V2 V3, no reciprocal changes or symptoms suggestive of ACS.  Signed out to Dr. Ernesto Rutherford.  Troponin, UA and MRI are pending.  If all negative, likely discharge with outpatient follow-up. [VB]    Clinical Course User Index [VB] Elgie Congo, MD                           Medical Decision Making Srinika EVALYNNE LOCURTO is a 46 y.o. female.  With PMH of DM 2, HTN, polysubstance abuse, bipolar disorder, depression, CVA with left-sided weakness presenting as code stroke for left-sided weakness and left facial droop. LKN 1:00 PM today.  Patient presenting to the ED with symptoms concerning for CVA. On arrival, the patient's airway is intact. Per EMS, glucose 220, BP 190/90. Reported symptoms include left facial droop and left sided weakness. Last known normal was13:00 on 01/27/22. Patient denies recent CVA or trauma, is not anticoagulated, no sudden onset headache or other symptoms concerning for [facility], no recent surgery.   Patient taken for emergent CT upon arrival . Patient is (within 3-4.5 hours after onset of symptoms without any contraindications and is thus a TnK candidate).  CT negative for hemorrhage. MI/dissection/seizure not suspected based on history.   Spoke with neurology. They are not recommending tnK administration.  They suspect this is functional in nature and recommend MRI brain and if negative can be discharged home.  We will also obtain basic blood work, EKG, UA to evaluate for other possible toxic/metabolic causes.    Amount and/or Complexity of  Data Reviewed Labs: ordered. Radiology: ordered.  Risk OTC drugs. Prescription drug management.    Final Clinical Impression(s) / ED Diagnoses Final diagnoses:  Left arm weakness    Rx / DC Orders ED Discharge Orders     None         Elgie Congo, MD 01/27/22 1704

## 2022-01-27 NOTE — Consult Note (Addendum)
NEUROLOGY CONSULTATION NOTE   Date of service: January 27, 2022 Patient Name: Kaitlyn Gray MRN:  [id number] DOB:  Oct 18, 1975 Reason for consult: "Stroke code for left-sided weakness" Requesting Provider: Mardene Sayer, MD _ _ _   _ __   _ __ _ _  __ __   _ __   __ _  History of Present Illness  Kaitlyn Gray is a 46 y.o. female with PMH significant for bipolar 1 disorder, poorly controlled diabetes, hypertension, smokes half a pack a day, prior history of strokes, history of conversion disorder, pseudoseizures, due to stroke, cocaine abuse who presents with sudden onset worsening of the left-sided weakness with a last known well of 1300 on 01/27/2022.  She has had multiple presentations in the past with acute worsening on the left side weakness along with numbness on the left side with multiple MRIs negative for acute strokes.  LKW: 1300 on 01/27/22. mRS: 0 tNKASE: not offered, felt more consistent with functional neurological weakness rather than an acute stroke. Thrombectomy: not offered, low suspicion for LVO. NIHSS components Score: Comment  1a Level of Conscious 0[x]  1[]  2[]  3[]      1b LOC Questions 0[]  1[x]  2[]       1c LOC Commands 0[x]  1[]  2[]       2 Best Gaze 0[x]  1[]  2[]       3 Visual 0[x]  1[]  2[]  3[]      4 Facial Palsy 0[x]  1[]  2[]  3[]      5a Motor Arm - left 0[]  1[x]  2[]  3[]  4[]  UN[]    5b Motor Arm - Right 0[x]  1[]  2[]  3[]  4[]  UN[]    6a Motor Leg - Left 0[]  1[]  2[]  3[x]  4[]  UN[]    6b Motor Leg - Right 0[x]  1[]  2[]  3[]  4[]  UN[]    7 Limb Ataxia 0[x]  1[]  2[]  3[]  UN[]     8 Sensory 0[]  1[]  2[x]  UN[]      9 Best Language 0[x]  1[]  2[]  3[]      10 Dysarthria 0[]  1[x]  2[]  UN[]      11 Extinct. and Inattention 0[x]  1[]  2[]       TOTAL: 8     ROS   Constitutional Denies weight loss, fever and chills.   HEENT Denies changes in vision and hearing.   Respiratory Denies SOB and cough.   CV Denies palpitations and CP   GI Denies abdominal pain, nausea, vomiting and  diarrhea.   GU Denies dysuria and urinary frequency.   MSK Denies myalgia and joint pain.   Skin Denies rash and pruritus.   Neurological Denies headache and syncope.   Psychiatric Denies recent changes in mood. Denies anxiety and depression.    Past History   Past Medical History:  Diagnosis Date   Bipolar 1 disorder (HCC)    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) 07/19/2017   Hypertension    Stroke [facility])    Past Surgical History:  Procedure Laterality Date   CESAREAN SECTION     Family History  Problem Relation Age of Onset   Hypertension Mother    CAD Mother 63       died of MI at age 60   Hypertension Father    Social History   Socioeconomic History   Marital status: Single    Spouse name: Not on file   Number of children: Not on file   Years of education: Not on file   Highest education level: Not on file  Occupational History   Not on  file  Tobacco Use   Smoking status: Every Day    Packs/day: 0.50    Types: Cigarettes   Smokeless tobacco: Never  Vaping Use   Vaping Use: Never used  Substance and Sexual Activity   Alcohol use: No   Drug use: Yes    Types: Marijuana, Cocaine   Sexual activity: Not on file  Other Topics Concern   Not on file  Social History Narrative   Not on file   Social Determinants of Health   Financial Resource Strain: Not on file  Food Insecurity: Not on file  Transportation Needs: Not on file  Physical Activity: Not on file  Stress: Not on file  Social Connections: Not on file   Allergies  Allergen Reactions   Hydrocodone Itching   Latex Rash    Medications  (Not in a hospital admission)    Vitals   There were no vitals filed for this visit.   There is no height or weight on file to calculate BMI.  Physical Exam   General: Laying comfortably in bed; in no acute distress.  HENT: Normal oropharynx and mucosa. Normal external appearance of ears and nose.  Neck: Supple, no  pain or tenderness  CV: No JVD. No peripheral edema.  Pulmonary: Symmetric Chest rise. Normal respiratory effort.  Abdomen: Soft to touch, non-tender.  Ext: No cyanosis, edema, or deformity  Skin: No rash. Normal palpation of skin.   Musculoskeletal: Normal digits and nails by inspection. No clubbing.   Neurologic Examination  Mental status/Cognition: Alert, oriented to self, place, month and year, good attention.  Speech/language: dysarthric speech but that is her baseline, fluent, comprehension intact, object naming intact, repetition intact. Cranial nerves:   CN II Pupils equal and reactive to light, no VF deficits    CN III,IV,VI EOM intact, no gaze preference or deviation, no nystagmus    CN V normal sensation in V1, V2, and V3 segments bilaterally    CN VII Mild L facial droop that goes away while she is distracted and talking.   CN VIII normal hearing to speech    CN IX & X normal palatal elevation, no uvular deviation    CN XI 5/5 head turn and 5/5 shoulder shrug bilaterally    CN XII midline tongue protrusion    Motor:  Muscle bulk: normal, tone normal.  Inconsistent L sided weakness. Improves when distracted.  Mvmt Root Nerve  Muscle Right Left Comments  SA C5/6 Ax Deltoid 5 3-4+   EF C5/6 Mc Biceps 5 4   EE C6/7/8 Rad Triceps 5 4   WF C6/7 Med FCR     WE C7/8 PIN ECU     F Ab C8/T1 U ADM/FDI 5 2   HF L1/2/3 Fem Illopsoas 5 1   KE L2/3/4 Fem Quad 5 1   DF L4/5 D Peron Tib Ant 5 1   PF S1/2 Tibial Grc/Sol 5 1    Reflexes:  Right Left Comments  Pectoralis      Biceps (C5/6) 1 1   Brachioradialis (C5/6) 1 1    Triceps (C6/7) 1 1    Patellar (L3/4) 1 1    Achilles (S1)      Hoffman      Plantar     Jaw jerk    Sensation:  Light touch Decreased on the left with midline splitting.   Pin prick    Temperature    Vibration   Proprioception    Coordination/Complex Motor:  -  Finger to Nose intact on the right, unable to do with LUE - Heel to shin unable to  do - Rapid alternating movement intact on the right. - Gait: deferred for patient safety.  Labs   CBC:  Recent Labs  Lab 01/27/22 1505  WBC 4.6  NEUTROABS 2.7  HGB 8.0*  9.2*  HCT 27.5*  27.0*  MCV 74.5*  PLT 308    Basic Metabolic Panel:  Lab Results  Component Value Date   NA 140 01/27/2022   K 3.6 01/27/2022   CO2 25 12/26/2021   GLUCOSE 288 (H) 01/27/2022   BUN 10 01/27/2022   CREATININE 0.80 01/27/2022   CALCIUM 8.6 (L) 12/26/2021   GFRNONAA >60 12/26/2021   GFRAA >60 03/14/2020   Lipid Panel:  Lab Results  Component Value Date   LDLCALC 102 (H) 09/07/2020   HgbA1c:  Lab Results  Component Value Date   HGBA1C 7.3 (H) 09/07/2020   Urine Drug Screen:     Component Value Date/Time   LABOPIA NONE DETECTED 03/20/2021 1655   COCAINSCRNUR POSITIVE (A) 03/20/2021 1655   LABBENZ NONE DETECTED 03/20/2021 1655   AMPHETMU NONE DETECTED 03/20/2021 1655   THCU NONE DETECTED 03/20/2021 1655   LABBARB NONE DETECTED 03/20/2021 1655    Alcohol Level     Component Value Date/Time   ETH <10 12/12/2021 1245    CT Head without contrast(Personally reviewed): CTH was negative for a large hypodensity concerning for a large territory infarct or hyperdensity concerning for an ICH  MRI Brain: pending  Impression   MEIRA WAHBA is a 46 y.o. female with PMH significant for bipolar 1 disorder, poorly controlled diabetes, hypertension, smokes half a pack a day, prior history of strokes, history of conversion disorder, pseudoseizures, due to stroke, cocaine abuse who presents with sudden onset worsening of the left-sided weakness with a last known well of 1300 on 01/27/2022. Clinical exam with inconsistent LUE weakness. Mildline splitting numbness. Presentation seems to be more consistent with functional neurological disorder presenting with weakness rather than an acute stroke. She was therefore not offered tnkase.  Given stroke risk factors, will recommend getting MRI  Brain without contrast. If negative for stroke, no further workup.  Impression: Functional neurological disorder with weakness.  Recommendations  Given stroke risk factors, will recommend getting MRI Brain without contrast. If negative for stroke, no further workup. ______________________________________________________________________  Plan discussed with Dr. Elpidio Anis over secure chat.   Update: Reviewed MRI Brain which was negative for an acute stroke. No further inpatient neurological workup at this time. We will signoff.  Thank you for the opportunity to take part in the care of this patient. If you have any further questions, please contact the neurology consultation attending.  Signed,  Erick Blinks [facility] Pager Number [phone] _ _ _   _ __   _ __ _ _  __ __   _ __   __ _

## 2022-01-27 NOTE — ED Provider Notes (Signed)
Patient was signed out to me by Dr. Elpidio Anis at 1600 pending MRI.  In short this is a 46 year old female with a past medical history of CVA with left-sided deficits, diabetes, hypertension and bipolar disorder presenting to the emergency department with acute left-sided weakness.  Patient's CT scan showed no acute occlusions.  She was evaluated by neurology who has lower suspicion for acute stroke.  She is pending MRI at this time as well as urine studies.  At the time of my evaluation, the patient was being wheeled down to MRI.   Phoebe Sharps, DO 01/27/22 2058

## 2022-02-28 ENCOUNTER — Emergency Department (HOSPITAL_COMMUNITY)
Admission: EM | Admit: 2022-02-28 | Discharge: 2022-02-28 | Disposition: A | Discharge status: Home / Self Care | Payer: Medicaid Other | Attending: Emergency Medicine | Admitting: Emergency Medicine

## 2022-02-28 ENCOUNTER — Emergency Department (HOSPITAL_COMMUNITY): Payer: Medicaid Other

## 2022-02-28 DIAGNOSIS — Z7982 Long term (current) use of aspirin: Secondary | ICD-10-CM | POA: Insufficient documentation

## 2022-02-28 DIAGNOSIS — Z91040 Latex allergy status: Secondary | ICD-10-CM | POA: Diagnosis not present

## 2022-02-28 DIAGNOSIS — Z79899 Other long term (current) drug therapy: Secondary | ICD-10-CM | POA: Diagnosis not present

## 2022-02-28 DIAGNOSIS — D649 Anemia, unspecified: Secondary | ICD-10-CM | POA: Diagnosis not present

## 2022-02-28 DIAGNOSIS — R0789 Other chest pain: Secondary | ICD-10-CM | POA: Insufficient documentation

## 2022-02-28 DIAGNOSIS — I10 Essential (primary) hypertension: Secondary | ICD-10-CM | POA: Insufficient documentation

## 2022-02-28 DIAGNOSIS — S0990XA Unspecified injury of head, initial encounter: Secondary | ICD-10-CM | POA: Diagnosis present

## 2022-02-28 DIAGNOSIS — W19XXXA Unspecified fall, initial encounter: Secondary | ICD-10-CM

## 2022-02-28 DIAGNOSIS — W0110XA Fall on same level from slipping, tripping and stumbling with subsequent striking against unspecified object, initial encounter: Secondary | ICD-10-CM | POA: Diagnosis not present

## 2022-02-28 DIAGNOSIS — Z794 Long term (current) use of insulin: Secondary | ICD-10-CM | POA: Diagnosis not present

## 2022-02-28 LAB — CBC WITH DIFFERENTIAL/PLATELET
Abs Immature Granulocytes: 0.01 10*3/uL (ref 0.00–0.07)
Basophils Absolute: 0 10*3/uL (ref 0.0–0.1)
Basophils Relative: 1 %
Eosinophils Absolute: 0.1 10*3/uL (ref 0.0–0.5)
Eosinophils Relative: 1 %
HCT: 27.1 % — ABNORMAL LOW (ref 36.0–46.0)
Hemoglobin: 7.8 g/dL — ABNORMAL LOW (ref 12.0–15.0)
Immature Granulocytes: 0 %
Lymphocytes Relative: 35 %
Lymphs Abs: 1.7 10*3/uL (ref 0.7–4.0)
MCH: 21.1 pg — ABNORMAL LOW (ref 26.0–34.0)
MCHC: 28.8 g/dL — ABNORMAL LOW (ref 30.0–36.0)
MCV: 73.4 fL — ABNORMAL LOW (ref 80.0–100.0)
Monocytes Absolute: 0.4 10*3/uL (ref 0.1–1.0)
Monocytes Relative: 8 %
Neutro Abs: 2.7 10*3/uL (ref 1.7–7.7)
Neutrophils Relative %: 55 %
Platelets: 294 10*3/uL (ref 150–400)
RBC: 3.69 MIL/uL — ABNORMAL LOW (ref 3.87–5.11)
RDW: 18.9 % — ABNORMAL HIGH (ref 11.5–15.5)
WBC: 4.9 10*3/uL (ref 4.0–10.5)
nRBC: 0 % (ref 0.0–0.2)

## 2022-02-28 LAB — TROPONIN I (HIGH SENSITIVITY)
Troponin I (High Sensitivity): 6 ng/L (ref <18)
Troponin I (High Sensitivity): 8 ng/L (ref <18)

## 2022-02-28 LAB — BASIC METABOLIC PANEL
Anion gap: 6 (ref 5–15)
BUN: 13 mg/dL (ref 6–20)
CO2: 26 mmol/L (ref 22–32)
Calcium: 9.1 mg/dL (ref 8.9–10.3)
Chloride: 107 mmol/L (ref 98–111)
Creatinine, Ser: 0.84 mg/dL (ref 0.44–1.00)
GFR, Estimated: >60 mL/min (ref >60)
Glucose, Bld: 101 mg/dL — ABNORMAL HIGH (ref 70–99)
Potassium: 3.5 mmol/L (ref 3.5–5.1)
Sodium: 139 mmol/L (ref 135–145)

## 2022-02-28 MED ORDER — ACETAMINOPHEN 500 MG PO TABS
1000.0000 mg | ORAL_TABLET | Freq: Once | ORAL | Status: AC
  Administered 2022-02-28: 1000 mg via ORAL
  Filled 2022-02-28: qty 2

## 2022-02-28 NOTE — Discharge Instructions (Signed)
Your labs and imaging today were overall reassuring.  You do have anemia but it is not significantly changed from your baseline.  Follow-up and have this rechecked with your regular doctor.  No signs of head bleeding or fracture or other injury from your fall today.  Return for new or worsening symptoms.

## 2022-02-28 NOTE — ED Triage Notes (Addendum)
Patient BIB [facility] from home, patient called 911 for chest pain that started at 0400 this morning. Patient arrives to ED with [facility] and [facility], patient shouting and uncooperative with EMS. Patient received 324mg  ASA from EMS PTA. Patient states she has baseline left sided weakness after a stroke.  Patient uncooperative with assessment in triage, answers questions simply by crying and shouting "I don't want to go to the lobby".  HR 116 BP 192/104 CBG 113 SpO2 98% on room air

## 2022-03-30 ENCOUNTER — Inpatient Hospital Stay (HOSPITAL_COMMUNITY)
Admission: EM | Admit: 2022-03-30 | Discharge: 2022-04-04 | DRG: 065 | Discharge status: Against Medical Advice | Payer: Medicaid Other | Attending: Family Medicine | Admitting: Family Medicine

## 2022-03-30 ENCOUNTER — Observation Stay (HOSPITAL_COMMUNITY): Payer: Medicaid Other

## 2022-03-30 ENCOUNTER — Emergency Department (HOSPITAL_COMMUNITY): Payer: Medicaid Other

## 2022-03-30 ENCOUNTER — Encounter (HOSPITAL_COMMUNITY): Payer: Self-pay | Admitting: Emergency Medicine

## 2022-03-30 ENCOUNTER — Other Ambulatory Visit: Payer: Self-pay

## 2022-03-30 DIAGNOSIS — F250 Schizoaffective disorder, bipolar type: Secondary | ICD-10-CM | POA: Diagnosis present

## 2022-03-30 DIAGNOSIS — F1420 Cocaine dependence, uncomplicated: Secondary | ICD-10-CM | POA: Diagnosis not present

## 2022-03-30 DIAGNOSIS — I63423 Cerebral infarction due to embolism of bilateral anterior cerebral arteries: Secondary | ICD-10-CM | POA: Diagnosis not present

## 2022-03-30 DIAGNOSIS — I6523 Occlusion and stenosis of bilateral carotid arteries: Secondary | ICD-10-CM | POA: Diagnosis present

## 2022-03-30 DIAGNOSIS — E1159 Type 2 diabetes mellitus with other circulatory complications: Secondary | ICD-10-CM | POA: Diagnosis present

## 2022-03-30 DIAGNOSIS — Z8249 Family history of ischemic heart disease and other diseases of the circulatory system: Secondary | ICD-10-CM

## 2022-03-30 DIAGNOSIS — Z716 Tobacco abuse counseling: Secondary | ICD-10-CM

## 2022-03-30 DIAGNOSIS — Z7984 Long term (current) use of oral hypoglycemic drugs: Secondary | ICD-10-CM

## 2022-03-30 DIAGNOSIS — I152 Hypertension secondary to endocrine disorders: Secondary | ICD-10-CM | POA: Diagnosis present

## 2022-03-30 DIAGNOSIS — E785 Hyperlipidemia, unspecified: Secondary | ICD-10-CM | POA: Diagnosis present

## 2022-03-30 DIAGNOSIS — Z8673 Personal history of transient ischemic attack (TIA), and cerebral infarction without residual deficits: Secondary | ICD-10-CM

## 2022-03-30 DIAGNOSIS — Z885 Allergy status to narcotic agent status: Secondary | ICD-10-CM

## 2022-03-30 DIAGNOSIS — Z823 Family history of stroke: Secondary | ICD-10-CM

## 2022-03-30 DIAGNOSIS — F319 Bipolar disorder, unspecified: Secondary | ICD-10-CM | POA: Diagnosis not present

## 2022-03-30 DIAGNOSIS — R112 Nausea with vomiting, unspecified: Secondary | ICD-10-CM | POA: Diagnosis not present

## 2022-03-30 DIAGNOSIS — F1210 Cannabis abuse, uncomplicated: Secondary | ICD-10-CM | POA: Diagnosis not present

## 2022-03-30 DIAGNOSIS — D649 Anemia, unspecified: Secondary | ICD-10-CM | POA: Diagnosis present

## 2022-03-30 DIAGNOSIS — Z6834 Body mass index (BMI) 34.0-34.9, adult: Secondary | ICD-10-CM

## 2022-03-30 DIAGNOSIS — Z7151 Drug abuse counseling and surveillance of drug abuser: Secondary | ICD-10-CM

## 2022-03-30 DIAGNOSIS — R471 Dysarthria and anarthria: Secondary | ICD-10-CM | POA: Diagnosis present

## 2022-03-30 DIAGNOSIS — E1169 Type 2 diabetes mellitus with other specified complication: Secondary | ICD-10-CM | POA: Diagnosis present

## 2022-03-30 DIAGNOSIS — Z91040 Latex allergy status: Secondary | ICD-10-CM

## 2022-03-30 DIAGNOSIS — R29711 NIHSS score 11: Secondary | ICD-10-CM | POA: Diagnosis present

## 2022-03-30 DIAGNOSIS — R109 Unspecified abdominal pain: Secondary | ICD-10-CM

## 2022-03-30 DIAGNOSIS — Z7982 Long term (current) use of aspirin: Secondary | ICD-10-CM

## 2022-03-30 DIAGNOSIS — G8311 Monoplegia of lower limb affecting right dominant side: Secondary | ICD-10-CM | POA: Diagnosis present

## 2022-03-30 DIAGNOSIS — I10 Essential (primary) hypertension: Secondary | ICD-10-CM | POA: Diagnosis present

## 2022-03-30 DIAGNOSIS — I69322 Dysarthria following cerebral infarction: Secondary | ICD-10-CM

## 2022-03-30 DIAGNOSIS — W1830XA Fall on same level, unspecified, initial encounter: Secondary | ICD-10-CM | POA: Diagnosis present

## 2022-03-30 DIAGNOSIS — Z91148 Patient's other noncompliance with medication regimen for other reason: Secondary | ICD-10-CM

## 2022-03-30 DIAGNOSIS — E669 Obesity, unspecified: Secondary | ICD-10-CM | POA: Diagnosis present

## 2022-03-30 DIAGNOSIS — I639 Cerebral infarction, unspecified: Secondary | ICD-10-CM | POA: Diagnosis present

## 2022-03-30 DIAGNOSIS — G8929 Other chronic pain: Secondary | ICD-10-CM | POA: Diagnosis present

## 2022-03-30 DIAGNOSIS — Z7902 Long term (current) use of antithrombotics/antiplatelets: Secondary | ICD-10-CM

## 2022-03-30 DIAGNOSIS — D509 Iron deficiency anemia, unspecified: Secondary | ICD-10-CM

## 2022-03-30 DIAGNOSIS — I69354 Hemiplegia and hemiparesis following cerebral infarction affecting left non-dominant side: Secondary | ICD-10-CM

## 2022-03-30 DIAGNOSIS — F1910 Other psychoactive substance abuse, uncomplicated: Secondary | ICD-10-CM | POA: Diagnosis present

## 2022-03-30 DIAGNOSIS — Z79899 Other long term (current) drug therapy: Secondary | ICD-10-CM

## 2022-03-30 DIAGNOSIS — E119 Type 2 diabetes mellitus without complications: Secondary | ICD-10-CM

## 2022-03-30 DIAGNOSIS — Z794 Long term (current) use of insulin: Secondary | ICD-10-CM

## 2022-03-30 DIAGNOSIS — E1165 Type 2 diabetes mellitus with hyperglycemia: Secondary | ICD-10-CM | POA: Insufficient documentation

## 2022-03-30 HISTORY — DX: Opioid use, unspecified, uncomplicated: F11.90

## 2022-03-30 HISTORY — DX: Dysarthria following cerebral infarction: I69.322

## 2022-03-30 HISTORY — DX: Patient's other noncompliance with medication regimen for other reason: Z91.148

## 2022-03-30 HISTORY — DX: Encounter for issue of other medical certificate: Z02.79

## 2022-03-30 HISTORY — DX: Anemia, unspecified: D64.9

## 2022-03-30 LAB — HEMOGLOBIN A1C
Hgb A1c MFr Bld: 6.6 % — ABNORMAL HIGH (ref 4.8–5.6)
Mean Plasma Glucose: 142.72 mg/dL

## 2022-03-30 LAB — RAPID URINE DRUG SCREEN, HOSP PERFORMED
Amphetamines: NOT DETECTED
Barbiturates: NOT DETECTED
Benzodiazepines: NOT DETECTED
Cocaine: POSITIVE — AB
Opiates: NOT DETECTED
Tetrahydrocannabinol: NOT DETECTED

## 2022-03-30 LAB — BASIC METABOLIC PANEL
Anion gap: 6 (ref 5–15)
BUN: 15 mg/dL (ref 6–20)
CO2: 23 mmol/L (ref 22–32)
Calcium: 8.6 mg/dL — ABNORMAL LOW (ref 8.9–10.3)
Chloride: 109 mmol/L (ref 98–111)
Creatinine, Ser: 0.87 mg/dL (ref 0.44–1.00)
GFR, Estimated: >60 mL/min (ref >60)
Glucose, Bld: 161 mg/dL — ABNORMAL HIGH (ref 70–99)
Potassium: 3.6 mmol/L (ref 3.5–5.1)
Sodium: 138 mmol/L (ref 135–145)

## 2022-03-30 LAB — CBC
HCT: 27.7 % — ABNORMAL LOW (ref 36.0–46.0)
Hemoglobin: 7.9 g/dL — ABNORMAL LOW (ref 12.0–15.0)
MCH: 21 pg — ABNORMAL LOW (ref 26.0–34.0)
MCHC: 28.5 g/dL — ABNORMAL LOW (ref 30.0–36.0)
MCV: 73.7 fL — ABNORMAL LOW (ref 80.0–100.0)
Platelets: 303 10*3/uL (ref 150–400)
RBC: 3.76 MIL/uL — ABNORMAL LOW (ref 3.87–5.11)
RDW: 18.7 % — ABNORMAL HIGH (ref 11.5–15.5)
WBC: 5.1 10*3/uL (ref 4.0–10.5)
nRBC: 0 % (ref 0.0–0.2)

## 2022-03-30 LAB — CBG MONITORING, ED: Glucose-Capillary: 123 mg/dL — ABNORMAL HIGH (ref 70–99)

## 2022-03-30 LAB — I-STAT BETA HCG BLOOD, ED (MC, WL, AP ONLY): I-stat hCG, quantitative: <5 m[IU]/mL (ref <5)

## 2022-03-30 LAB — URINALYSIS, ROUTINE W REFLEX MICROSCOPIC
Bilirubin Urine: NEGATIVE
Glucose, UA: 50 mg/dL — AB
Hgb urine dipstick: NEGATIVE
Ketones, ur: NEGATIVE mg/dL
Leukocytes,Ua: NEGATIVE
Nitrite: NEGATIVE
Protein, ur: NEGATIVE mg/dL
Specific Gravity, Urine: 1.023 (ref 1.005–1.030)
pH: 6 (ref 5.0–8.0)

## 2022-03-30 MED ORDER — ASPIRIN 325 MG PO TABS
325.0000 mg | ORAL_TABLET | Freq: Every day | ORAL | Status: DC
  Administered 2022-03-30: 325 mg via ORAL
  Filled 2022-03-30: qty 1

## 2022-03-30 MED ORDER — SENNOSIDES-DOCUSATE SODIUM 8.6-50 MG PO TABS: 1.0000 | ORAL_TABLET | Freq: Every evening | ORAL | Status: DC | PRN

## 2022-03-30 MED ORDER — IOHEXOL 350 MG/ML SOLN
75.0000 mL | Freq: Once | INTRAVENOUS | Status: AC | PRN
  Administered 2022-03-30: 75 mL via INTRAVENOUS

## 2022-03-30 MED ORDER — STROKE: EARLY STAGES OF RECOVERY BOOK
Freq: Once | Status: AC
  Filled 2022-03-30: qty 1

## 2022-03-30 MED ORDER — ACETAMINOPHEN 325 MG PO TABS: 650.0000 mg | ORAL_TABLET | ORAL | Status: DC | PRN

## 2022-03-30 MED ORDER — ENOXAPARIN SODIUM 40 MG/0.4ML IJ SOSY
40.0000 mg | PREFILLED_SYRINGE | INTRAMUSCULAR | Status: DC
  Administered 2022-03-30 – 2022-04-03 (×4): 40 mg via SUBCUTANEOUS
  Filled 2022-03-30 (×5): qty 0.4

## 2022-03-30 MED ORDER — LORAZEPAM 2 MG/ML IJ SOLN
1.0000 mg | Freq: Once | INTRAMUSCULAR | Status: AC | PRN
  Administered 2022-03-30: 1 mg via INTRAVENOUS
  Filled 2022-03-30: qty 1

## 2022-03-30 MED ORDER — METFORMIN HCL ER 500 MG PO TB24
1000.0000 mg | ORAL_TABLET | Freq: Two times a day (BID) | ORAL | Status: DC
  Administered 2022-04-02 – 2022-04-03 (×3): 1000 mg via ORAL
  Administered 2022-04-04: 500 mg via ORAL
  Filled 2022-03-30 (×5): qty 2

## 2022-03-30 MED ORDER — ACETAMINOPHEN 160 MG/5ML PO SOLN: 650.0000 mg | ORAL | Status: DC | PRN

## 2022-03-30 MED ORDER — QUETIAPINE FUMARATE 200 MG PO TABS
200.0000 mg | ORAL_TABLET | Freq: Every day | ORAL | Status: DC
  Administered 2022-03-30 – 2022-04-03 (×5): 200 mg via ORAL
  Filled 2022-03-30 (×4): qty 1
  Filled 2022-03-30: qty 2

## 2022-03-30 MED ORDER — INSULIN ASPART 100 UNIT/ML IJ SOLN: 0.0000 [IU] | Freq: Three times a day (TID) | INTRAMUSCULAR | Status: DC

## 2022-03-30 MED ORDER — ACETAMINOPHEN 650 MG RE SUPP: 650.0000 mg | RECTAL | Status: DC | PRN

## 2022-03-30 MED ORDER — CLOPIDOGREL BISULFATE 75 MG PO TABS
75.0000 mg | ORAL_TABLET | Freq: Every day | ORAL | Status: DC
  Administered 2022-03-31 – 2022-04-01 (×2): 75 mg via ORAL
  Filled 2022-03-30 (×2): qty 1

## 2022-03-30 NOTE — H&P (Cosign Needed Addendum)
Hospital Admission History and Physical Service Pager: [phone]  Patient name: Kaitlyn Gray Medical record number: [id number] Date of Birth: 02-08-1976 Age: 46 y.o. Gender: female  Primary Care Provider: Patient, No Pcp Per Consultants: Neurology (by ED) Code Status: FULL CODE  Preferred Emergency Contact:  Carime Dinkel [facility]): [phone]  Chief Complaint: Right leg weakness  Assessment and Plan: Kaitlyn Gray is a 46 y.o. female with hx of two prior CVA (2021, 2022), HTN, T2DM, bipolar who presented with right leg weakness since the night of 10/14 and was found to have multiple acute strokes on MRI imaging likely secondary to medication non-adherence given poor social situation.   * CVA (cerebral vascular accident) [facility]) Patient presented out of window for acute intervention. She has hx of prior CVA with some residual left-sided weakness from those. Now with worsened weakness and imaging findings of acute CVA. Neurology consulted in the ED. Will need to be monitored on telemetry and will order risk stratifying labs. Will also involve TOC to assist with barriers to healthcare. -Admit to progressive, attending Dr. McDiarmid -Nursing: Vitals per unit routine, out of bed with assistance only, neuro checks q2h -Neuro stroke team following, appreciate care and recs -A1c, Lipid panel  -Echo  -S/p ASA 325 mg -Anticipate DAPT with ASA and Plavix, will await Neurology recommendations -SLP, PT, OT eval and treat -Heart healthy/carb modified diet as she did pass [employer] Screen -Permissive HTN up to 220/120 x 24 hours -Notify MD for BP's > 220/120   Hypertension BP elevated 170-180s/90-130s. Not on any medications given difficult social situation. Given acute CVA, will allow permissive HTN for 24 hours. -Monitor BP -Consider IV labetalol if BP rises above threshold   Diabetes mellitus without complication (HCC) Last G6Y in 08/2020 7.3. Reports she takes Metformin and  Glipizide. Reports that she was previously taking 45U of Lantus and SSI but has not for some time. Appears that she may have ran out of all of her medications.  -Hgb A1c -Heart healthy/carb modified diet -CBGs AC/qHS -Continue Metformin -Hold glipizide  -mSSI; adjust as needed  Microcytic anemia Hgb 7.9 with MCV 73.7. Appears chronic over last couple of months. Has been prescribed iron in the past for low ferritin and iron saturation. She is due for colonoscopy, does not appear to have had one. Father passed from colon cancer.  -Daily CBCs -Ferritin, Iron panel -Consider IV iron during hospitalization  -F/u with GI outpatient for colonoscopy  Bipolar disorder ([place]) Continue Seroquel.   Cocaine use disorder, severe, dependence ([place]) Reports last use 2 days ago. -TOC consulted for substance use resources  Cannabis abuse Intermittent use.  -Encourage cessation   FEN/GI: Heart healthy/Carb modified  VTE Prophylaxis: Lovenox  Disposition: Progressive  History of Present Illness:  Kaitlyn Gray is a 46 y.o. female presenting with right leg weakness.  Patient is that she has a history of stroke but this time felt different.  Her symptoms started last night when she felt off balance and Falling into a wall.  States that when she was turning up the stairs her foot kept hitting the stairs.  She decided to go to bed and see if her symptoms resolved in the morning.  This morning when she attempted to get up out of bed she states that her legs felt weak and she could not stand.  She ended up falling and called 911. She reports her speech is also slightly slurred. She reports that her right side feels weak,  endorses numbness to her left side.  She has chronic vision changes from a prior stroke in 2022.  She has her medication bottles but has no refills.  Would like to get situated with a new PCP, social worker, and possibly an Charity fundraiser at home for some additional assistance.  She denies any nausea,  vomiting, diarrhea, dizziness, shortness of breath, chest pain, headaches.  Last bowel movement was yesterday.  In the ED, she was found to have chronic LUE and LLE weakness in addition to new RLE weakness. CT head without acute findings. MRI demonstrated multiple acute infarcts.  Review Of Systems: Per HPI.  Pertinent Past Medical History: Type 2 diabetes Hypertension Bipolar History of CVA x2 (2021, 2022) Remainder reviewed in history tab.   Pertinent Past Surgical History: C-section Remainder reviewed in history tab.   Pertinent Social History: Tobacco use: Yes, smoking 2 cigarettes daily, previously 1ppd Alcohol use: Denies  Other Substance use: Occasional marijuana use, cocaine "when I am hurting", last used 2 days ago Lives alone in a 3 bedroom apartment  Pertinent Family History: Parents with diabetes.  Father with CVA Remainder reviewed in history tab.   Important Outpatient Medications: Glipizide Lisinopril Metformin Aspirin Seroquel Reports that she was previously taking 45 units of Lantus and a sliding scale Remainder reviewed in medication history.   Objective: BP (!) 179/121   Pulse (!) 58   Temp 98.5 F (36.9 C)   Resp 20   SpO2 100%  Exam: General: Alert and oriented, disheveled, speech is slightly slurred but comprehendible, in NAD Skin: Warm, dry, and intact without lesions HEENT: NCAT, EOM grossly normal, midline nasal septum Cardiac: RRR, no m/r/g appreciated Respiratory: CTAB, breathing and speaking comfortably on RA Abdominal: Soft, nontender, nondistended, normoactive bowel sounds Extremities: No obvious deformities, no edema to b/l lower extremities, 2+ radial and DP pulses b/l Neurological:  Cranial Nerves: II: Visual Fields are full. PERRL.  III,IV, VI: EOMI without ptosis or diplopia.  V: Facial sensation is symmetric to temperature VII: Facial movement is symmetric.  VIII: hearing is intact to voice X: Palate elevates  symmetrically XI: Unable to shrug left shoulder, right shoulder shrug intact XII: tongue is midline without atrophy or fasciculations.  Motor: Tone is normal. Bulk is normal. 2/5 strength in LUE, 3/5 LLE. RUE strength is 4/5, RLE strength 3/5 Sensory: Sensation is symmetric to light touch and temperature in the arms and legs. Cerebellar: Rapid alternating hand movements are slow and uncoordinated b/l Psychiatric: Appropriate mood and affect   Labs:  CBC BMET  Recent Labs  Lab 03/30/22 1058  WBC 5.1  HGB 7.9*  HCT 27.7*  PLT 303   Recent Labs  Lab 03/30/22 1058  NA 138  K 3.6  CL 109  CO2 23  BUN 15  CREATININE 0.87  GLUCOSE 161*  CALCIUM 8.6*     EKG: NSR, no BBB or STEMI, Qtc 466  Imaging Studies Performed:  CT head without contrast IMPRESSION: 1. No acute intracranial abnormalities. 2. Remote right occipital infarct, unchanged.  MRI brain without contrast IMPRESSION: 1. Cluster of acute infarctions at the left frontoparietal vertex. Punctate acute infarctions in the right medial parietal lobe. Findings may represent anterior cerebral artery territory insults. No large confluent infarction. No hemorrhage or mass effect. 2. Old infarctions in the pons, right cerebellar peduncle and right occipital cortical and subcortical brain. Chronic small-vessel ischemic changes of the hemispheric white matter.  Sabino Dick, DO 03/30/2022, 9:27 PM PGY-3, [facility] FPTS Intern  pager: [phone], text pages welcome Secure chat group [facility] Teaching Service

## 2022-03-30 NOTE — Assessment & Plan Note (Signed)
>>  ASSESSMENT AND PLAN FOR BIPOLAR DISORDER (HCC) WRITTEN ON 03/30/2022  9:22 PM BY ESPINOZA, ALEJANDRA  Continue Seroquel .

## 2022-03-30 NOTE — ED Notes (Signed)
Again, the patient was attempting to climb out of the bed. The door was open so this RN was able to get to her bedside prior to her getting up. The patient stated she had to urinate. This RN and the NT pulled her back into a supine position onto the bed and placed a purewick. This RN again reminded her to not get out of the bed. Bed arm placed.

## 2022-03-30 NOTE — Consult Note (Signed)
NEUROLOGY CONSULTATION NOTE   Date of service: March 30, 2022 Patient Name: Kaitlyn Gray MRN:  [id number] DOB:  [birth year] Reason for consult: "R leg weakness" Requesting Provider: McDiarmid, Blane Ohara, MD _ _ _   _ __   _ __ _ _  __ __   _ __   __ _  History of Present Illness  Kaitlyn Gray is a 46 y.o. female with PMH significant for prior strokes, HTN, DM2, Bipolar, smoker, cocaine use who presents with R leg weakness and found to have scattered left frontoparietal region close to the vertex. Also has a punctate stroke in the R medial parietal lobe.  Neurology consulted for further evaluation.  Patient reports falling and having to hold onto the wall. Symptoms noted on 10/14 at night. She went to bed and woke up in AM and was unable to stand. She called 911 and was brought in. She has chronic LUE and LLE weakness but this time reports R L weakness.  LKW: 03/29/22 2000 mRS: *** tNKASE: *** Thrombectomy: *** NIHSS components Score: Comment  1a Level of Conscious 0[]  1[]  2[]  3[]      1b LOC Questions 0[]  1[]  2[]       1c LOC Commands 0[]  1[]  2[]       2 Best Gaze 0[]  1[]  2[]       3 Visual 0[]  1[]  2[]  3[]      4 Facial Palsy 0[]  1[]  2[]  3[]      5a Motor Arm - left 0[]  1[]  2[]  3[]  4[]  UN[]    5b Motor Arm - Right 0[]  1[]  2[]  3[]  4[]  UN[]    6a Motor Leg - Left 0[]  1[]  2[]  3[]  4[]  UN[]    6b Motor Leg - Right 0[]  1[]  2[]  3[]  4[]  UN[]    7 Limb Ataxia 0[]  1[]  2[]  3[]  UN[]     8 Sensory 0[]  1[]  2[]  UN[]      9 Best Language 0[]  1[]  2[]  3[]      10 Dysarthria 0[]  1[]  2[]  UN[]      11 Extinct. and Inattention 0[]  1[]  2[]       TOTAL:        ROS   Constitutional Denies weight loss, fever and chills. ***  HEENT Denies changes in vision and hearing. ***  Respiratory Denies SOB and cough. ***  CV Denies palpitations and CP ***  GI Denies abdominal pain, nausea, vomiting and diarrhea. ***  GU Denies dysuria and urinary frequency. ***  MSK Denies myalgia and joint pain. ***  Skin  Denies rash and pruritus. ***  Neurological Denies headache and syncope. ***  Psychiatric Denies recent changes in mood. Denies anxiety and depression. ***   Past History   Past Medical History:  Diagnosis Date   Bipolar 1 disorder ([place])    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 ([place]) 07/19/2017   Hypertension    Stroke [facility])    Past Surgical History:  Procedure Laterality Date   CESAREAN SECTION     Family History  Problem Relation Age of Onset   Hypertension Mother    CAD Mother 37       died of MI at age 6   Hypertension Father    Social History   Socioeconomic History   Marital status: Single    Spouse name: Not on file   Number of children: Not on file   Years of education: Not on file   Highest education level: Not on file  Occupational History   Not on file  Tobacco Use   Smoking status: Every Day    Packs/day: 0.50    Types: Cigarettes   Smokeless tobacco: Never  Vaping Use   Vaping Use: Never used  Substance and Sexual Activity   Alcohol use: No   Drug use: Yes    Types: Marijuana, Cocaine   Sexual activity: Not on file  Other Topics Concern   Not on file  Social History Narrative   Not on file   Social Determinants of Health   Financial Resource Strain: Not on file  Food Insecurity: Not on file  Transportation Needs: Not on file  Physical Activity: Not on file  Stress: Not on file  Social Connections: Not on file   Allergies  Allergen Reactions   Hydrocodone Itching   Latex Rash    Medications  (Not in a hospital admission)    Vitals   Vitals:   03/30/22 1336 03/30/22 1545 03/30/22 1556 03/30/22 1743  BP: (!) 181/133 (!) 179/121    Pulse: 65 (!) 58    Resp: 16 20    Temp:    98.5 F (36.9 C)  TempSrc:      SpO2: 98% 100% 100%      There is no height or weight on file to calculate BMI.  Physical Exam   General: Laying comfortably in bed; in no acute distress. *** HENT: Normal oropharynx  and mucosa. Normal external appearance of ears and nose. *** Neck: Supple, no pain or tenderness *** CV: No JVD. No peripheral edema. *** Pulmonary: Symmetric Chest rise. Normal respiratory effort. *** Abdomen: Soft to touch, non-tender. *** Ext: No cyanosis, edema, or deformity *** Skin: No rash. Normal palpation of skin.  *** Musculoskeletal: Normal digits and nails by inspection. No clubbing. ***  Neurologic Examination  Mental status/Cognition: Alert, oriented to self, place, month and year, good attention. *** Speech/language: Fluent, comprehension intact, object naming intact, repetition intact. *** Cranial nerves:   CN II Pupils equal and reactive to light, no VF deficits ***   CN III,IV,VI EOM intact, no gaze preference or deviation, no nystagmus ***   CN V normal sensation in V1, V2, and V3 segments bilaterally ***   CN VII no asymmetry, no nasolabial fold flattening ***   CN VIII normal hearing to speech ***   CN IX & X normal palatal elevation, no uvular deviation ***   CN XI 5/5 head turn and 5/5 shoulder shrug bilaterally ***   CN XII midline tongue protrusion ***   Motor:  Muscle bulk: ***, tone ***, pronator drift *** tremor *** Mvmt Root Nerve  Muscle Right Left Comments  SA C5/6 Ax Deltoid     EF C5/6 Mc Biceps     EE C6/7/8 Rad Triceps     WF C6/7 Med FCR     WE C7/8 PIN ECU     F Ab C8/T1 U ADM/FDI     HF L1/2/3 Fem Illopsoas     KE L2/3/4 Fem Quad     DF L4/5 D Peron Tib Ant     PF S1/2 Tibial Grc/Sol      Reflexes:  Right Left Comments  Pectoralis      Biceps (C5/6)     Brachioradialis (C5/6)      Triceps (C6/7)      Patellar (L3/4)      Achilles (S1)      Hoffman      Plantar     Jaw jerk    Sensation:  Light touch  Pin prick    Temperature    Vibration   Proprioception    Coordination/Complex Motor:  - Finger to Nose *** - Heel to shin *** - Rapid alternating movement *** - Gait: Stride length ***. Arm swing ***. Base width  ***  Labs   CBC:  Recent Labs  Lab 03/30/22 1058  WBC 5.1  HGB 7.9*  HCT 27.7*  MCV 73.7*  PLT 303    Basic Metabolic Panel:  Lab Results  Component Value Date   NA 138 03/30/2022   K 3.6 03/30/2022   CO2 23 03/30/2022   GLUCOSE 161 (H) 03/30/2022   BUN 15 03/30/2022   CREATININE 0.87 03/30/2022   CALCIUM 8.6 (L) 03/30/2022   GFRNONAA >60 03/30/2022   GFRAA >60 03/14/2020   Lipid Panel:  Lab Results  Component Value Date   LDLCALC 102 (H) 09/07/2020   HgbA1c:  Lab Results  Component Value Date   HGBA1C 7.3 (H) 09/07/2020   Urine Drug Screen:     Component Value Date/Time   LABOPIA NONE DETECTED 01/27/2022 2101   COCAINSCRNUR POSITIVE (A) 01/27/2022 2101   LABBENZ NONE DETECTED 01/27/2022 2101   AMPHETMU NONE DETECTED 01/27/2022 2101   THCU NONE DETECTED 01/27/2022 2101   LABBARB NONE DETECTED 01/27/2022 2101    Alcohol Level     Component Value Date/Time   ETH <10 01/27/2022 1505    CT Head without contrast(Personally reviewed): ***  CT angio Head and Neck with contrast(Personally reviewed): ***  MR Angio head without contrast and Carotid Duplex BL(Personally reviewed): ***  MRI Brain(Personally reviewed): ***  rEEG:  ***  Impression   Kaitlyn Gray is a 46 y.o. female with PMH significant for ***. Her neurologic examination is notable for ***.  Primary Diagnosis:  {Cerebral Infarction:22351}  Secondary Diagnosis: {Stroke Comorbidities:21266}  Recommendations  *** ______________________________________________________________________   Thank you for the opportunity to take part in the care of this patient. If you have any further questions, please contact the neurology consultation attending.  Signed,  Erick Blinks [facility] Pager Number [phone] _ _ _   _ __   _ __ _ _  __ __   _ __   __ _

## 2022-03-30 NOTE — Assessment & Plan Note (Addendum)
Previously not on any medications.  Started losartan 10/19. Pt requests micardis which we do not have in formulary. -Continue losartan 25 mg daily

## 2022-03-30 NOTE — ED Notes (Signed)
The patient continues to attempt to climb out of bed despite being unable to stand or walk. The patient was found standing at the edge of her bed. She did not fall. She was able to bear her weight while holding onto the bed itself. This RN and the NT assisted her back into her bed and explained to her the importance of remaining in bed, using her call bell and to not get up. The patient was assisted back into the bed.

## 2022-03-30 NOTE — ED Provider Triage Note (Signed)
Emergency Medicine Provider Triage Evaluation Note  Kaitlyn Gray , a 46 y.o. female  was evaluated in triage.  Pt complains of generalized weakness since last night.  History of CVA with residual left-sided deficits.  Denies chest pain, shortness of breath, headache.  Review of Systems  Positive: As above Negative: As above  Physical Exam  BP (!) 170/98 (BP Location: Right Arm)   Pulse 64   Temp 98.2 F (36.8 C) (Oral)   Resp 16   SpO2 99%  Gen:   Awake, no distress   Resp:  Normal effort  MSK:   Moves extremities without difficulty  Other:  Full range of motion bilateral upper extremities.  Refuses to move either of the lower extremities secondary to reported weakness.  When distracted patient does move her lower extremities.  Medical Decision Making  Medically screening exam initiated at 11:49 AM.  Appropriate orders placed.  Kaitlyn Gray was informed that the remainder of the evaluation will be completed by another provider, this initial triage assessment does not replace that evaluation, and the importance of remaining in the ED until their evaluation is complete.  Repeatedly requesting something to eat.   Evlyn Courier, PA-C 03/30/22 1150

## 2022-03-30 NOTE — ED Notes (Signed)
Dr. Haviland at bedside. 

## 2022-03-30 NOTE — Assessment & Plan Note (Signed)
Continue Seroquel. 

## 2022-03-30 NOTE — Assessment & Plan Note (Signed)
Intermittent use.  -Encourage cessation

## 2022-03-30 NOTE — ED Triage Notes (Addendum)
Patient BIB [facility] from patient's home for evaluation of generalized weakness and increased fall that started Thursday, per EMS history of baseline left sided weakness and dysarthria after a previous stroke. Patient is alert, oriented, and in no apparent distress. Patient continuously grinds her teeth when she is not speaking.  EMS Vitals BP 170/100 HR 90 RR 16 SpO2 97% on room air CBG 228

## 2022-03-30 NOTE — Progress Notes (Signed)
Spoke with Dr. Gilford Raid with ED. [facility]MTS will admit this patient for further stroke workup and management. H&P to follow.

## 2022-03-30 NOTE — Assessment & Plan Note (Addendum)
Hgb A1c returned as 6.6, will not initiate insulin at this time. Reports she is supposed to take Metformin and Glipizide. Appears that she may have ran out of all of her medications.  -Continue Metformin 500 BID -Hold glipizide

## 2022-03-30 NOTE — ED Notes (Signed)
Patient transported to MRI 

## 2022-03-30 NOTE — Assessment & Plan Note (Addendum)
Low iron, ferritin, and %sat. She is due for colonoscopy, does not appear to have had one. Father passed from colon cancer.  -oral iron supplementation -F/u with GI outpatient for colonoscopy

## 2022-03-30 NOTE — Assessment & Plan Note (Signed)
Reports last use 2 days prior to admission. -TOC consulted for substance use resources

## 2022-03-30 NOTE — ED Notes (Signed)
IV attempted by this RN x 1 without success. The patient became upset that I was not able to obtain IV access on the first try because "the phlebotomist is able to stick me just once" and demanded I get a phlebotomist to start her IV. I explained to her the phlebotomists use different needles and that they are not trained to start IVs. She then insulted me telling me that I am a bad nurse and I "am bad at IVs." I told her that she can tell me that I am a bad nurse as much as she wants to but it will not hurt my feelings. I informed her that I was not intending to hurt her and apologized. I asked if she would allow me to get her IV started on her other arm but she again refused and told me to get away from her and she would not let me attempt again. This RN then asked another nurse to attempt an IV.

## 2022-03-30 NOTE — ED Provider Notes (Signed)
[facility] EMERGENCY DEPARTMENT Provider Note   CSN: [id number] Arrival date & time: 03/30/22  1029     History  No chief complaint on file.   Kaitlyn Gray is a 46 y.o. female.  Pt is a 46 yo female with pmhx significant for cva, htn, dm, bipolar d/o, and polysubstance abuse.  Pt said she has residual weakness in the left arm and left leg.  She noticed that her right leg was weak last night before she went to bed.  She normally walks with a walker, but was unable to walk with her walker today because her right leg was too weak. Pt said her speech has been more slurred than usual.  Pt did not take any of her meds this am.  Later, however, she admits to not having any of her meds because she's not been able to see a doctor to get them filled.  Pt has come in multiple times for code strokes which have ended not to be stroke.  However, usually, she says her left leg is more weak than usual and today, she has new right sided sx.       Home Medications Prior to Admission medications   Medication Sig Start Date End Date Taking? Authorizing Provider  amLODipine-benazepril (LOTREL) 5-10 MG capsule Take 1 capsule by mouth daily. Patient not taking: Reported on 02/28/2022 10/17/21   [provider]  aspirin 81 MG chewable tablet Chew 1 tablet (81 mg total) by mouth daily. 03/15/20   Carlisle Cater, PA-C  blood glucose meter kit and supplies KIT Dispense based on patient and insurance preference. Use up to four times daily as directed. (FOR ICD-9 250.00, 250.01). Please give 3 months supply of lancets and test strips to allow her to check QID. 12/07/17   Roxan Hockey, MD  blood glucose meter kit and supplies Rel[employer] Prime or Dispense other brand based on patient and insurance preference. Use up to four times daily as directed. (FOR ICD-9 250.00, 250.01). 12/07/17   Roxan Hockey, MD  ferrous sulfate 325 (65 FE) MG tablet Take 1 tablet (325 mg total) by mouth  daily. Patient not taking: Reported on 02/28/2022 01/20/20   Charlesetta Shanks, MD  gabapentin (NEURONTIN) 400 MG capsule Take 1 capsule (400 mg total) by mouth 2 (two) times daily. 09/13/20   Nita Sells, MD  glipiZIDE (GLUCOTROL XL) 10 MG 24 hr tablet Take 10 mg by mouth daily. Patient not taking: Reported on 02/28/2022 10/17/21   [provider]  Insulin Pen Needle 31G X 5 MM MISC Use as directed 12/07/17   Roxan Hockey, MD  lisinopril (ZESTRIL) 10 MG tablet Take 2 tablets (20 mg total) by mouth daily. 03/20/21 02/28/22  Wyvonnia Dusky, MD  QUEtiapine (SEROQUEL) 200 MG tablet Take 200 mg by mouth at bedtime. Patient not taking: Reported on 02/28/2022 08/31/20   [provider]      Allergies    Hydrocodone and Latex    Review of Systems   Review of Systems  Neurological:  Positive for weakness.  All other systems reviewed and are negative.   Physical Exam Updated Vital Signs BP (!) 179/121   Pulse (!) 58   Temp 98.5 F (36.9 C)   Resp 20   SpO2 100%  Physical Exam Vitals and nursing note reviewed.  Constitutional:      Appearance: Normal appearance.  HENT:     Head: Normocephalic and atraumatic.     Right Ear: External ear normal.  Left Ear: External ear normal.     Nose: Nose normal.     Mouth/Throat:     Mouth: Mucous membranes are dry.  Eyes:     Extraocular Movements: Extraocular movements intact.     Conjunctiva/sclera: Conjunctivae normal.     Pupils: Pupils are equal, round, and reactive to light.  Cardiovascular:     Rate and Rhythm: Normal rate and regular rhythm.     Pulses: Normal pulses.     Heart sounds: Normal heart sounds.  Pulmonary:     Effort: Pulmonary effort is normal.     Breath sounds: Normal breath sounds.  Abdominal:     General: Abdomen is flat. Bowel sounds are normal.     Palpations: Abdomen is soft.  Musculoskeletal:        General: Normal range of motion.     Cervical back: Normal range of motion and neck  supple.  Skin:    General: Skin is warm.     Capillary Refill: Capillary refill takes less than 2 seconds.  Neurological:     Mental Status: She is alert and oriented to person, place, and time.     Comments: Left arm/leg weakness (chronic).  Right leg weakness (new); pt feels like her speech is worse than usual.  Vision is no worse than usual.  Psychiatric:        Mood and Affect: Mood normal.        Behavior: Behavior normal.     ED Results / Procedures / Treatments   Labs (all labs ordered are listed, but only abnormal results are displayed) Labs Reviewed  BASIC METABOLIC PANEL - Abnormal; Notable for the following components:      Result Value   Glucose, Bld 161 (*)    Calcium 8.6 (*)    All other components within normal limits  CBC - Abnormal; Notable for the following components:   RBC 3.76 (*)    Hemoglobin 7.9 (*)    HCT 27.7 (*)    MCV 73.7 (*)    MCH 21.0 (*)    MCHC 28.5 (*)    RDW 18.7 (*)    All other components within normal limits  URINALYSIS, ROUTINE W REFLEX MICROSCOPIC  RAPID URINE DRUG SCREEN, HOSP PERFORMED  CBG MONITORING, ED  I-STAT BETA HCG BLOOD, ED ([facility], [facility], [facility] ONLY)    EKG EKG Interpretation  Date/Time:  Sunday March 30 2022 10:26:27 EDT Ventricular Rate:  63 PR Interval:  138 QRS Duration: 84 QT Interval:  456 QTC Calculation: 466 R Axis:   37 Text Interpretation: Normal sinus rhythm Nonspecific T wave abnormality Prolonged QT Abnormal ECG When compared with ECG of 28-Feb-2022 10:11, PREVIOUS ECG IS PRESENT No significant change since last tracing Confirmed by Isla Pence [phone]) on 03/30/2022 3:50:17 PM  Radiology MR BRAIN WO CONTRAST  Result Date: 03/30/2022 CLINICAL DATA:  Neuro deficit, acute, stroke suspected. Generalized weakness on the left. EXAM: MRI HEAD WITHOUT CONTRAST TECHNIQUE: Multiplanar, multiecho pulse sequences of the brain and surrounding structures were obtained without intravenous contrast. COMPARISON:  Head CT  earlier same day.  MRI 01/27/2022. FINDINGS: Brain: Diffusion imaging shows a cluster of acute infarctions at the left frontoparietal vertex. There are a few punctate acute infarctions in the right medial parietal lobe. Findings may represent anterior cerebral artery territory insults. No large confluent infarction. Elsewhere, old infarctions in the pons and right cerebellar peduncle appear unchanged. Old infarction affecting the right occipital cortical and subcortical brain. Chronic small-vessel ischemic changes  of the white matter are unchanged. No evidence of acute hemorrhage, hydrocephalus or extra-axial collection. Vascular: Major vessels at the base of the brain show flow. Skull and upper cervical spine: Negative Sinuses/Orbits: No advanced inflammatory change.  Orbits negative. Other: None IMPRESSION: 1. Cluster of acute infarctions at the left frontoparietal vertex. Punctate acute infarctions in the right medial parietal lobe. Findings may represent anterior cerebral artery territory insults. No large confluent infarction. No hemorrhage or mass effect. 2. Old infarctions in the pons, right cerebellar peduncle and right occipital cortical and subcortical brain. Chronic small-vessel ischemic changes of the hemispheric white matter. Electronically Signed   By: Nelson Chimes M.D.   On: 03/30/2022 17:11   CT Head Wo Contrast  Result Date: 03/30/2022 CLINICAL DATA:  46 year old female history of generalized weakness. Fall. Dysarthria. EXAM: CT HEAD WITHOUT CONTRAST TECHNIQUE: Contiguous axial images were obtained from the base of the skull through the vertex without intravenous contrast. RADIATION DOSE REDUCTION: This exam was performed according to the departmental dose-optimization program which includes automated exposure control, adjustment of the mA and/or kV according to patient size and/or use of iterative reconstruction technique. COMPARISON:  Head CT 02/28/2022. FINDINGS: Brain: Encephalomalacia in  the medial aspect of the right occipital lobe, unchanged. No evidence of acute infarction, hemorrhage, hydrocephalus, extra-axial collection or mass lesion/mass effect. Vascular: No hyperdense vessel or unexpected calcification. Skull: Normal. Negative for fracture or focal lesion. Sinuses/Orbits: No acute finding. Other: None. IMPRESSION: 1. No acute intracranial abnormalities. 2. Remote right occipital infarct, unchanged. Electronically Signed   By: Vinnie Langton M.D.   On: 03/30/2022 12:22    Procedures Procedures    Medications Ordered in ED Medications  aspirin tablet 325 mg (has no administration in time range)  LORazepam (ATIVAN) injection 1 mg (1 mg Intravenous Given 03/30/22 1618)    ED Course/ Medical Decision Making/ A&P                           Medical Decision Making Amount and/or Complexity of Data Reviewed Labs: ordered. Radiology: ordered.  Risk OTC drugs. Prescription drug management. Decision regarding hospitalization.   This patient presents to the ED for concern of right sided weakness, this involves an extensive number of treatment options, and is a complaint that carries with it a high risk of complications and morbidity.  The differential diagnosis includes cva, tia, polysubstance abuse, malingering   Co morbidities that complicate the patient evaluation  cva, htn, dm, bipolar d/o, and polysubstance abuse   Additional history obtained:  Additional history obtained from epic chart review    Lab Tests:  I Ordered, and personally interpreted labs.  The pertinent results include:  cbc with hgb 7.9 (hgb chronically low.  Last hgb 7.8 on 9/15), preg neg, cmp nl other than bs elevated at 161   Imaging Studies ordered:  I ordered imaging studies including ct head/mri brain  I independently visualized and interpreted imaging which showed  CT head: IMPRESSION:  1. No acute intracranial abnormalities.  2. Remote right occipital infarct, unchanged.   MRI brain: IMPRESSION:  1. Cluster of acute infarctions at the left frontoparietal vertex.  Punctate acute infarctions in the right medial parietal lobe.  Findings may represent anterior cerebral artery territory insults.  No large confluent infarction. No hemorrhage or mass effect.  2. Old infarctions in the pons, right cerebellar peduncle and right  occipital cortical and subcortical brain. Chronic small-vessel  ischemic changes of the hemispheric white  matter.   I agree with the radiologist interpretation   Cardiac Monitoring:  The patient was maintained on a cardiac monitor.  I personally viewed and interpreted the cardiac monitored which showed an underlying rhythm of: nsr   Medicines ordered and prescription drug management:  I ordered medication including asa  for cva  Reevaluation of the patient after these medicines showed that the patient stayed the same I have reviewed the patients home medicines and have made adjustments as needed   Test Considered:  mri   Critical Interventions:  Neuro consult   Consultations Obtained:  I requested consultation with the neurologist (Dr. Curly Shores),  and discussed lab and imaging findings as well as pertinent plan - she will see pt in consult Pt d/w FP residents for admission.   Problem List / ED Course:  CVA:  pt is outside the window for TNK.  CT angio head/neck ordered.  She has not been taking any of her meds.  She will need admission.   Reevaluation:  After the interventions noted above, I reevaluated the patient and found that they have :improved   Social Determinants of Health:  Lives at home; hx of homelessness   Dispostion:  After consideration of the diagnostic results and the patients response to treatment, I feel that the patent would benefit from admission.    CRITICAL CARE Performed by: Isla Pence   Total critical care time: 30 minutes  Critical care time was exclusive of separately  billable procedures and treating other patients.  Critical care was necessary to treat or prevent imminent or life-threatening deterioration.  Critical care was time spent personally by me on the following activities: development of treatment plan with patient and/or surrogate as well as nursing, discussions with consultants, evaluation of patient's response to treatment, examination of patient, obtaining history from patient or surrogate, ordering and performing treatments and interventions, ordering and review of laboratory studies, ordering and review of radiographic studies, pulse oximetry and re-evaluation of patient's condition.         Final Clinical Impression(s) / ED Diagnoses Final diagnoses:  Cerebrovascular accident (CVA), unspecified mechanism ([place])  Noncompliance with medications  Chronic anemia    Rx / DC Orders ED Discharge Orders     None         Isla Pence, MD 03/30/22 1913

## 2022-03-30 NOTE — Assessment & Plan Note (Addendum)
Neuro exam stable from prior.  Patient is medically stable for SNF.  -Appreciate neurology recs  -DAPT with ASA and Plavix x3 months -> Plavix alone  -outpatient f/u with interventional neuroradiology  -rosuvastatin 40mg , goal LDL <70 - PT, OT eval and treat  -recommended SNF, TOC consulted and awaiting placement -Appreciate TOC involvement for healthcare barriers

## 2022-03-31 ENCOUNTER — Observation Stay (HOSPITAL_COMMUNITY): Payer: Medicaid Other

## 2022-03-31 ENCOUNTER — Encounter (HOSPITAL_COMMUNITY): Payer: Self-pay | Admitting: Student

## 2022-03-31 DIAGNOSIS — I6389 Other cerebral infarction: Secondary | ICD-10-CM | POA: Diagnosis not present

## 2022-03-31 DIAGNOSIS — I152 Hypertension secondary to endocrine disorders: Secondary | ICD-10-CM | POA: Diagnosis present

## 2022-03-31 DIAGNOSIS — I6523 Occlusion and stenosis of bilateral carotid arteries: Secondary | ICD-10-CM | POA: Diagnosis present

## 2022-03-31 DIAGNOSIS — E669 Obesity, unspecified: Secondary | ICD-10-CM | POA: Diagnosis present

## 2022-03-31 DIAGNOSIS — F1210 Cannabis abuse, uncomplicated: Secondary | ICD-10-CM | POA: Diagnosis present

## 2022-03-31 DIAGNOSIS — Z8673 Personal history of transient ischemic attack (TIA), and cerebral infarction without residual deficits: Secondary | ICD-10-CM

## 2022-03-31 DIAGNOSIS — Z91148 Patient's other noncompliance with medication regimen for other reason: Secondary | ICD-10-CM | POA: Diagnosis not present

## 2022-03-31 DIAGNOSIS — E1159 Type 2 diabetes mellitus with other circulatory complications: Secondary | ICD-10-CM | POA: Diagnosis not present

## 2022-03-31 DIAGNOSIS — I69354 Hemiplegia and hemiparesis following cerebral infarction affecting left non-dominant side: Secondary | ICD-10-CM | POA: Diagnosis not present

## 2022-03-31 DIAGNOSIS — I639 Cerebral infarction, unspecified: Secondary | ICD-10-CM | POA: Diagnosis present

## 2022-03-31 DIAGNOSIS — W1830XA Fall on same level, unspecified, initial encounter: Secondary | ICD-10-CM | POA: Diagnosis present

## 2022-03-31 DIAGNOSIS — Z7984 Long term (current) use of oral hypoglycemic drugs: Secondary | ICD-10-CM | POA: Diagnosis not present

## 2022-03-31 DIAGNOSIS — I63523 Cerebral infarction due to unspecified occlusion or stenosis of bilateral anterior cerebral arteries: Secondary | ICD-10-CM | POA: Diagnosis not present

## 2022-03-31 DIAGNOSIS — Z79899 Other long term (current) drug therapy: Secondary | ICD-10-CM | POA: Diagnosis not present

## 2022-03-31 DIAGNOSIS — Z6834 Body mass index (BMI) 34.0-34.9, adult: Secondary | ICD-10-CM | POA: Diagnosis not present

## 2022-03-31 DIAGNOSIS — F319 Bipolar disorder, unspecified: Secondary | ICD-10-CM | POA: Diagnosis present

## 2022-03-31 DIAGNOSIS — Z8249 Family history of ischemic heart disease and other diseases of the circulatory system: Secondary | ICD-10-CM | POA: Diagnosis not present

## 2022-03-31 DIAGNOSIS — G8929 Other chronic pain: Secondary | ICD-10-CM | POA: Diagnosis present

## 2022-03-31 DIAGNOSIS — R112 Nausea with vomiting, unspecified: Secondary | ICD-10-CM | POA: Diagnosis not present

## 2022-03-31 DIAGNOSIS — D509 Iron deficiency anemia, unspecified: Secondary | ICD-10-CM | POA: Diagnosis present

## 2022-03-31 DIAGNOSIS — I63423 Cerebral infarction due to embolism of bilateral anterior cerebral arteries: Secondary | ICD-10-CM | POA: Diagnosis present

## 2022-03-31 DIAGNOSIS — Z7982 Long term (current) use of aspirin: Secondary | ICD-10-CM | POA: Diagnosis not present

## 2022-03-31 DIAGNOSIS — R29711 NIHSS score 11: Secondary | ICD-10-CM | POA: Diagnosis present

## 2022-03-31 DIAGNOSIS — E1169 Type 2 diabetes mellitus with other specified complication: Secondary | ICD-10-CM | POA: Diagnosis present

## 2022-03-31 DIAGNOSIS — G8311 Monoplegia of lower limb affecting right dominant side: Secondary | ICD-10-CM | POA: Diagnosis present

## 2022-03-31 DIAGNOSIS — Z794 Long term (current) use of insulin: Secondary | ICD-10-CM | POA: Diagnosis not present

## 2022-03-31 DIAGNOSIS — F1910 Other psychoactive substance abuse, uncomplicated: Secondary | ICD-10-CM

## 2022-03-31 DIAGNOSIS — R109 Unspecified abdominal pain: Secondary | ICD-10-CM | POA: Diagnosis not present

## 2022-03-31 DIAGNOSIS — I69322 Dysarthria following cerebral infarction: Secondary | ICD-10-CM

## 2022-03-31 DIAGNOSIS — F1190 Opioid use, unspecified, uncomplicated: Secondary | ICD-10-CM | POA: Insufficient documentation

## 2022-03-31 DIAGNOSIS — E785 Hyperlipidemia, unspecified: Secondary | ICD-10-CM | POA: Diagnosis present

## 2022-03-31 DIAGNOSIS — D649 Anemia, unspecified: Secondary | ICD-10-CM | POA: Diagnosis not present

## 2022-03-31 DIAGNOSIS — F1420 Cocaine dependence, uncomplicated: Secondary | ICD-10-CM | POA: Diagnosis present

## 2022-03-31 DIAGNOSIS — E119 Type 2 diabetes mellitus without complications: Secondary | ICD-10-CM

## 2022-03-31 DIAGNOSIS — R471 Dysarthria and anarthria: Secondary | ICD-10-CM | POA: Diagnosis present

## 2022-03-31 DIAGNOSIS — Z7902 Long term (current) use of antithrombotics/antiplatelets: Secondary | ICD-10-CM | POA: Diagnosis not present

## 2022-03-31 LAB — ECHOCARDIOGRAM COMPLETE BUBBLE STUDY
Area-P 1/2: 2.9 cm2
P 1/2 time: 460 msec
S' Lateral: 2.7 cm

## 2022-03-31 LAB — CBG MONITORING, ED: Glucose-Capillary: 123 mg/dL — ABNORMAL HIGH (ref 70–99)

## 2022-03-31 LAB — HIV ANTIBODY (ROUTINE TESTING W REFLEX): HIV Screen 4th Generation wRfx: NONREACTIVE

## 2022-03-31 MED ORDER — ASPIRIN 81 MG PO TBEC
81.0000 mg | DELAYED_RELEASE_TABLET | Freq: Every day | ORAL | Status: DC
  Administered 2022-03-31 – 2022-04-04 (×5): 81 mg via ORAL
  Filled 2022-03-31 (×5): qty 1

## 2022-03-31 MED ORDER — ONDANSETRON 4 MG PO TBDP: 4.0000 mg | ORAL_TABLET | Freq: Three times a day (TID) | ORAL | Status: DC | PRN

## 2022-03-31 MED ORDER — ONDANSETRON HCL 4 MG/2ML IJ SOLN
4.0000 mg | Freq: Once | INTRAMUSCULAR | Status: AC
  Administered 2022-03-31: 4 mg via INTRAVENOUS
  Filled 2022-03-31: qty 2

## 2022-03-31 MED ORDER — ROSUVASTATIN CALCIUM 20 MG PO TABS
40.0000 mg | ORAL_TABLET | Freq: Every day | ORAL | Status: DC
  Administered 2022-03-31 – 2022-04-04 (×5): 40 mg via ORAL
  Filled 2022-03-31 (×5): qty 2

## 2022-03-31 MED ORDER — ONDANSETRON 4 MG PO TBDP
4.0000 mg | ORAL_TABLET | Freq: Three times a day (TID) | ORAL | Status: DC | PRN
  Administered 2022-04-02: 4 mg via ORAL
  Filled 2022-03-31: qty 1

## 2022-03-31 MED ORDER — ORAL CARE MOUTH RINSE: 15.0000 mL | OROMUCOSAL | Status: DC | PRN

## 2022-03-31 NOTE — ED Notes (Signed)
Attempted to wake patient up for NIH she refused to cooperate states she didn't want to do anything.

## 2022-03-31 NOTE — Evaluation (Signed)
Occupational Therapy Evaluation Patient Details Name: Kaitlyn Gray MRN: [id number] DOB: 19-Oct-1975 Today's Date: 03/31/2022   History of Present Illness Pt is a 46 y/o female who presented with RLE weakness. MRI brain showed multiple infarcts of L frontoparietal vertex. PMH:  two prior CVA (2021, 2022), HTN, T2DM, bipolar   Clinical Impression   PTA, pt lives alone, typically Modified Independent with ADLs, basic IADLs and mobility using Rollator. Due to 2 level home, pt reports primarily staying on couch downstairs and using BSC. Pt presents now with deficits in cognition, strength, coordination, proprioception and balance. Pt at high risk for falls, requiring Min/Mod A for mobility to sink in room. Pt requires Min A for UB ADL and up to Max A for LB ADLs d/t deficits. Recommend SNF rehab at DC. Will continue to follow acutely.       Recommendations for follow up therapy are one component of a multi-disciplinary discharge planning process, led by the attending physician.  Recommendations may be updated based on patient status, additional functional criteria and insurance authorization.   Follow Up Recommendations  Skilled nursing-short term rehab (<3 hours/day)    Assistance Recommended at Discharge Frequent or constant Supervision/Assistance  Patient can return home with the following A lot of help with walking and/or transfers;A lot of help with bathing/dressing/bathroom    Functional Status Assessment  Patient has had a recent decline in their functional status and demonstrates the ability to make significant improvements in function in a reasonable and predictable amount of time.  Equipment Recommendations  Other (comment) (RW)    Recommendations for Other Services       Precautions / Restrictions Precautions Precautions: Fall;Other (comment) Precaution Comments: high fall risk Restrictions Weight Bearing Restrictions: No      Mobility Bed Mobility Overal bed  mobility: Needs Assistance Bed Mobility: Supine to Sit, Sit to Supine     Supine to sit: Supervision, HOB elevated Sit to supine: Min assist   General bed mobility comments: assist to get BLE back into bed    Transfers Overall transfer level: Needs assistance Equipment used: Rollator (4 wheels) Transfers: Sit to/from Stand, Bed to chair/wheelchair/BSC Sit to Stand: Min assist     Step pivot transfers: Min assist     General transfer comment: stabilizing Rollator needed to stand, and Min A to maintain balance and cue for sequencing to pivot back to bed after ADLs at sink      Balance Overall balance assessment: Needs assistance Sitting-balance support: Feet supported, No upper extremity supported Sitting balance-Leahy Scale: Fair     Standing balance support: Bilateral upper extremity supported, During functional activity Standing balance-Leahy Scale: Poor                             ADL either performed or assessed with clinical judgement   ADL Overall ADL's : Needs assistance/impaired Eating/Feeding: Independent;Bed level   Grooming: Minimal assistance;Sitting;Oral care Grooming Details (indicate cue type and reason): performed task seated d/t poor standing balance and knee buckling. Min A needed to complete task and to minimize mess as pt with poor coordination, problem solving and attempted to spit in sink but ended up on counter rather than in sink Upper Body Bathing: Minimal assistance;Sitting   Lower Body Bathing: Moderate assistance;Sit to/from stand;Sitting/lateral leans   Upper Body Dressing : Minimal assistance;Sitting   Lower Body Dressing: Maximal assistance;Sitting/lateral leans;Sit to/from stand   Toilet Transfer: Minimal assistance;Stand-pivot;BSC/3in1   Toileting- Clothing  Manipulation and Hygiene: Moderate assistance;Sitting/lateral lean;Sit to/from stand       Functional mobility during ADLs: Minimal assistance;Moderate  assistance;Rollator (4 wheels) General ADL Comments: Pt's current rollator brakes broken so assisted to stabilize w/ transitional movements. Overall, poor coordination and proprioception of BUE and LE with pt at high risk for falls     Vision Ability to See in Adequate Light: 1 Impaired Patient Visual Report: No change from baseline Vision Assessment?: No apparent visual deficits     Perception     Praxis      Pertinent Vitals/Pain Pain Assessment Pain Assessment: No/denies pain     Hand Dominance Right   Extremity/Trunk Assessment Upper Extremity Assessment Upper Extremity Assessment: RUE deficits/detail;LUE deficits/detail RUE Deficits / Details: 3+/5 overall, slow coordination, ataxic movements at times RUE Coordination: decreased fine motor LUE Deficits / Details: weaker than R UE from prior CVA, 3/5, ataxic movements at times LUE Coordination: decreased fine motor   Lower Extremity Assessment Lower Extremity Assessment: Defer to PT evaluation   Cervical / Trunk Assessment Cervical / Trunk Assessment: Normal   Communication Communication Communication: Other (comment) (gaps in speech at times)   Cognition Arousal/Alertness: Awake/alert Behavior During Therapy: WFL for tasks assessed/performed Overall Cognitive Status: History of cognitive impairments - at baseline                                 General Comments: hx of psych diagnoses, likely close to baseline. poor insight into deficits, safety and health literacy. follows directions, pleasant during session     General Comments       Exercises     Shoulder Instructions      Home Living Family/patient expects to be discharged to:: Private residence Living Arrangements: Alone Available Help at Discharge: Other (Comment) (aunt lives in [place]) Type of Home: Other(Comment) (duplex) Home Access: Stairs to enter Entrance Stairs-Number of Steps: 5 steps down on sidewalk then 1 step up into duplex    Home Layout: Two level;Bed/bath upstairs Alternate Level Stairs-Number of Steps: 13 Alternate Level Stairs-Rails: Right           Home Equipment: Rollator (4 wheels);BSC/3in1 (brakes on rollator are broken)          Prior Functioning/Environment Prior Level of Function : Independent/Modified Independent             Mobility Comments: use of Rollator for mobility (brakes are broken) ADLs Comments: uses BSC and sleeps on couch primarily. does go upstairs for showers intermittently        OT Problem List: Decreased strength;Decreased activity tolerance;Impaired balance (sitting and/or standing);Decreased cognition;Decreased safety awareness;Decreased knowledge of use of DME or AE;Decreased coordination      OT Treatment/Interventions: Self-care/ADL training;Therapeutic exercise;DME and/or AE instruction;Energy conservation;Therapeutic activities;Patient/family education    OT Goals(Current goals can be found in the care plan section) Acute Rehab OT Goals Patient Stated Goal: eat some lunch, get a nurse to assist at home OT Goal Formulation: With patient Time For Goal Achievement: 04/14/22 Potential to Achieve Goals: Good ADL Goals Pt Will Perform Grooming: with supervision;standing Pt Will Perform Lower Body Bathing: with supervision;sit to/from stand;sitting/lateral leans Pt Will Perform Lower Body Dressing: with supervision;sitting/lateral leans;sit to/from stand Pt Will Transfer to Toilet: with min guard assist;ambulating  OT Frequency: Min 2X/week    Co-evaluation              AM-PAC OT "6 Clicks" Daily Activity  Outcome Measure Help from another person eating meals?: None Help from another person taking care of personal grooming?: A Little Help from another person toileting, which includes using toliet, bedpan, or urinal?: A Lot Help from another person bathing (including washing, rinsing, drying)?: A Lot Help from another person to put on and taking  off regular upper body clothing?: A Little Help from another person to put on and taking off regular lower body clothing?: A Lot 6 Click Score: 16   End of Session Equipment Utilized During Treatment: Rollator (4 wheels) Nurse Communication: Mobility status  Activity Tolerance: Patient tolerated treatment well Patient left: in bed;with call bell/phone within reach;with bed alarm set  OT Visit Diagnosis: Unsteadiness on feet (R26.81);Other abnormalities of gait and mobility (R26.89);Muscle weakness (generalized) (M62.81)                Time: 4010-2725 OT Time Calculation (min): 25 min Charges:  OT General Charges $OT Visit: 1 Visit OT Evaluation $OT Eval Moderate Complexity: 1 Mod OT Treatments $Self Care/Home Management : 8-22 mins  Malachy Chamber, OTR/L Acute Rehab Services Office: [phone]   Layla Maw 03/31/2022, 2:49 PM

## 2022-03-31 NOTE — Progress Notes (Signed)
PT Cancellation Note  Patient Details Name: Kaitlyn Gray MRN: [id number] DOB: Dec 17, 1975   Cancelled Treatment:    Reason Eval/Treat Not Completed: Other (comment) The pt is currently being transferred OTF. Will follow up as schedule allows.   Lou Miner, DPT  Acute Rehabilitation Services  Office: [phone]    Rudean Hitt 03/31/2022, 1:36 PM

## 2022-03-31 NOTE — ED Notes (Signed)
Pt is refusing to keep on the cardiac leads, BP cuff and pulse ox.

## 2022-03-31 NOTE — Progress Notes (Signed)
Attempted lower extremity venous duplex. Pt with OT.  Will try again when schedule permits.   Kaitlyn Gray 03/31/2022 2:30 PM

## 2022-03-31 NOTE — ED Notes (Signed)
Patient continues to pull monitor and blood pressure cuff off. Had extensive talk with patient on the importance of keeping the monitor on and that she was on a specific diet and we will be following her orders. Patient appears to understand and is calm at present.

## 2022-03-31 NOTE — Hospital Course (Addendum)
Kaitlyn Gray is a 46 y.o.female with a history of 2 prior CVAs (2021, 2022), HTN, T2DM, bipolar disorder, cocaine use who was admitted to the family medicine teaching Service at [facility] for stroke. Her hospital course is detailed below:  Of note, on the morning after her admission, the patient was disruptive to staff and was initially removed from the ED.  However, she did not sign AMA papers and stated that she wished to stay and receive care.  She was then readmitted and was not disruptive for the majority of her stay. On 10/20, the patient left AMA from the hospital.  CVA, likely embolic Presented with right-sided weakness as a code stroke, outside the window for thrombolytics.  Also has chronic left-sided weakness from prior stroke.  MRI with multiple acute infarcts.  CTA head and neck with stenosis of multiple branches as well as bilateral ICA.  Full imaging reports below.  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  Patient received PT/OT, recommended SNF placement after discharge.However, patient left AMA prior to bed placement. Sent Plavix and Crestor to pharmacy.  HTN Started losartan 25mg  daily after initial period of permissive hypertension.  T2DM A1c 6.6.  Continued on home metformin.  Held glipizide.  Patient reports that she ran out of her medications.  Microcytic anemia Chronic, stable.  Iron studies checked, noted to have low iron, ferritin, percent saturation.  Started oral iron supplementation during this stay.  Recommend that she receive a screening colonoscopy outpatient.  Polysubstance abuse Patient reports cocaine use 2 days prior to admission.  States that she mainly uses for pain control.  Avoided narcotics during her stay.  Other chronic conditions  Bipolar disorder: continued seroquel   PCP Follow-up Recommendations:  Patient left AMA so may not receive medications. Sent meds to home  pharmacy  Follow-up with outpatient interventional neuroradiology for progressive Right ICA stenosis (currently asymptomatic) DAPT x3 months (until 07/03/2021) followed by clopidogrel monotherapy Started on oral iron supplementation.  Patient is due for colonoscopy Patient may benefit from outpatient pain clinic Please f/u hypertension and diabetes regimen - started on losartan and continued metformin    PERTINENT IMAGING:  CT HEAD WO CONTRAST IMPRESSION: 1. No acute intracranial abnormalities. 2. Remote right occipital infarct, unchanged.   MR BRAIN WO CONTRAST IMPRESSION: 1. Cluster of acute infarctions at the left frontoparietal vertex. Punctate acute infarctions in the right medial parietal lobe. Findings may represent anterior cerebral artery territory insults. No large confluent infarction. No hemorrhage or mass effect. 2. Old infarctions in the pons, right cerebellar peduncle and right occipital cortical and subcortical brain. Chronic small-vessel ischemic changes of the hemispheric white matter.   CT ANGIO HEAD NECK IMPRESSION: Moderate stenosis of the right PCA distal P1 segment, severe stenosis of the proximal P2 segment and occlusion of proximal P3 segment.   ADDENDUM: Moderate to severe stenosis in the distal A2 or proximal A3 (series 6, image 255). The distal ACAs are somewhat irregular.   As described in the body of the report, there is approximately 70% stenosis in the mid right extracranial ICA.   KUB IMPRESSION: Gas-filled loops of colon at the upper limits of normal in caliber. No definite obstruction.

## 2022-03-31 NOTE — Progress Notes (Signed)
OT Cancellation Note  Patient Details Name: Kaitlyn Gray MRN: [id number] DOB: Dec 22, 1975   Cancelled Treatment:    Reason Eval/Treat Not Completed: Patient at procedure or test/ unavailable Echo at bedside  Layla Maw 03/31/2022, 9:07 AM

## 2022-03-31 NOTE — ED Notes (Addendum)
The patient was heard yelling, did not use her call bell although it was on the bed right next to her. She was yelling out that she had to urinate. This RN and Vera, NT went into her room and she appeared to have removed her purewick and had urine soaked sheets. Peri care performed, new sheets placed and new gown placed on the patient.

## 2022-03-31 NOTE — ED Notes (Signed)
Patient is yelling , got up with her walker and walked out of the room states she is leaving states yall are treating me like a "fxxxing slave , because I would not continue to give her sandwiches and saltine crackers, I explained as I had mutiple times she is on a special diet , she continued to cuss staff in the hallway , patient was assisted to wheelchair and taken to the lobby, because she said she was leaving. Admitting MD aware and came down to the department to see.

## 2022-03-31 NOTE — Progress Notes (Signed)
FMTS Interim Progress Note  S: Notified by nursing staff that patient had vomited.  Patient reports she feels nauseous, denies having difficulty swallowing her food.  She has epigastric pain, feels a little better after vomiting.  Says that she does not feel constipated, but has not had a bowel movement during admission.  O: BP (!) 169/112 (BP Location: Left Arm)   Pulse 79   Temp 98.1 F (36.7 C) (Oral)   Resp 20   SpO2 100%   General: Not in acute distress, chronically ill-appearing GI: No tenderness to palpation, no rebound tenderness, soft, not distended  A/P: Nausea/vomiting Patient is unsure if she can tolerate a p.o. medication.  We will use one-time IV. - Zofran IV one time - Zofran ODT starting tomorrow morning as needed  Leslie Dales, DO 03/31/2022, 8:47 PM PGY-1, [place] [facility] pager [phone]'

## 2022-03-31 NOTE — Progress Notes (Signed)
  Echocardiogram 2D Echocardiogram has been performed.  Kaitlyn Gray 03/31/2022, 9:34 AM

## 2022-03-31 NOTE — Progress Notes (Addendum)
STROKE TEAM PROGRESS NOTE   INTERVAL HISTORY Her RN is at the bedside.  Seen in ED, patient is impulsive and has tried to leave AMA today. She tells me that she is currently using cocaine for pain because she couldn't get her hydrocodone refilled and the gabapentin does not help her leg or back pain. She has chronic weakness in her left lower extremity.   Vitals:   03/31/22 0530 03/31/22 0600 03/31/22 0630 03/31/22 0752  BP: (!) 152/92 (!) 168/104 (!) 146/72   Pulse: 64 66 (!) 58   Resp: 19 17    Temp:    (!) 97.5 F (36.4 C)  TempSrc:    Oral  SpO2: 99% 100% 99%    CBC:  Recent Labs  Lab 03/30/22 1058  WBC 5.1  HGB 7.9*  HCT 27.7*  MCV 73.7*  PLT 303   Basic Metabolic Panel:  Recent Labs  Lab 03/30/22 1058  NA 138  K 3.6  CL 109  CO2 23  GLUCOSE 161*  BUN 15  CREATININE 0.87  CALCIUM 8.6*   Lipid Panel: No results for input(s): "CHOL", "TRIG", "HDL", "CHOLHDL", "VLDL", "LDLCALC" in the last 168 hours. HgbA1c:  Recent Labs  Lab 03/30/22 2234  HGBA1C 6.6*   Urine Drug Screen:  Recent Labs  Lab 03/30/22 1039  LABOPIA NONE DETECTED  COCAINSCRNUR POSITIVE*  LABBENZ NONE DETECTED  AMPHETMU NONE DETECTED  THCU NONE DETECTED  LABBARB NONE DETECTED    Alcohol Level No results for input(s): "ETH" in the last 168 hours.  IMAGING past 24 hours CT ANGIO HEAD NECK W WO CM  Result Date: 03/30/2022 CLINICAL DATA:  Weakness and falls EXAM: CT ANGIOGRAPHY HEAD AND NECK TECHNIQUE: Multidetector CT imaging of the head and neck was performed using the standard protocol during bolus administration of intravenous contrast. Multiplanar CT image reconstructions and MIPs were obtained to evaluate the vascular anatomy. Carotid stenosis measurements (when applicable) are obtained utilizing NASCET criteria, using the distal internal carotid diameter as the denominator. RADIATION DOSE REDUCTION: This exam was performed according to the departmental dose-optimization program which  includes automated exposure control, adjustment of the mA and/or kV according to patient size and/or use of iterative reconstruction technique. CONTRAST:  63mL OMNIPAQUE IOHEXOL 350 MG/ML SOLN COMPARISON:  None Available. FINDINGS: CTA NECK FINDINGS SKELETON: There is no bony spinal canal stenosis. No lytic or blastic lesion. OTHER NECK: Normal pharynx, larynx and major salivary glands. No cervical lymphadenopathy. Unremarkable thyroid gland. UPPER CHEST: No pneumothorax or pleural effusion. No nodules or masses. AORTIC ARCH: There is no calcific atherosclerosis of the aortic arch. There is no aneurysm, dissection or hemodynamically significant stenosis of the visualized portion of the aorta. Conventional 3 vessel aortic branching pattern. The visualized proximal subclavian arteries are widely patent. RIGHT CAROTID SYSTEM: There is an area of approximately 70% stenosis of the mid right ICA (5:17, at the location of marked curvature. LEFT CAROTID SYSTEM: Normal without aneurysm, dissection or stenosis. VERTEBRAL ARTERIES: Left dominant configuration. Both origins are clearly patent. There is no dissection, occlusion or flow-limiting stenosis to the skull base (V1-V3 segments). CTA HEAD FINDINGS POSTERIOR CIRCULATION: --Vertebral arteries: Normal V4 segments. --Inferior cerebellar arteries: Normal. --Basilar artery: Normal. --Superior cerebellar arteries: Normal. --Posterior cerebral arteries (PCA): There is moderate stenosis of the right PCA distal P1 segment, severe stenosis of the proximal P2 segment and occlusion of proximal P3 segment. Left PCA is unremarkable. ANTERIOR CIRCULATION: --Intracranial internal carotid arteries: Normal. --Anterior cerebral arteries (ACA): Normal. Both A1 segments  are present. Patent anterior communicating artery (a-comm). --Middle cerebral arteries (MCA): Normal. VENOUS SINUSES: As permitted by contrast timing, patent. ANATOMIC VARIANTS: None Review of the MIP images confirms the  above findings. 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 Electronically Signed   By: Deatra Robinson M.D.   On: 03/30/2022 20:22   MR BRAIN WO CONTRAST  Result Date: 03/30/2022 CLINICAL DATA:  Neuro deficit, acute, stroke suspected. Generalized weakness on the left. EXAM: MRI HEAD WITHOUT CONTRAST TECHNIQUE: Multiplanar, multiecho pulse sequences of the brain and surrounding structures were obtained without intravenous contrast. COMPARISON:  Head CT earlier same day.  MRI 01/27/2022. FINDINGS: Brain: Diffusion imaging shows a cluster of acute infarctions at the left frontoparietal vertex. There are a few punctate acute infarctions in the right medial parietal lobe. Findings may represent anterior cerebral artery territory insults. No large confluent infarction. Elsewhere, old infarctions in the pons and right cerebellar peduncle appear unchanged. Old infarction affecting the right occipital cortical and subcortical brain. Chronic small-vessel ischemic changes of the white matter are unchanged. No evidence of acute hemorrhage, hydrocephalus or extra-axial collection. Vascular: Major vessels at the base of the brain show flow. Skull and upper cervical spine: Negative Sinuses/Orbits: No advanced inflammatory change.  Orbits negative. Other: None IMPRESSION: 1. Cluster of acute infarctions at the left frontoparietal vertex. Punctate acute infarctions in the right medial parietal lobe. Findings may represent anterior cerebral artery territory insults. No large confluent infarction. No hemorrhage or mass effect. 2. Old infarctions in the pons, right cerebellar peduncle and right occipital cortical and subcortical brain. Chronic small-vessel ischemic changes of the hemispheric white matter. Electronically Signed   By: Paulina Fusi M.D.   On: 03/30/2022 17:11   CT Head Wo Contrast  Result Date:  03/30/2022 CLINICAL DATA:  46 year old female history of generalized weakness. Fall. Dysarthria. EXAM: CT HEAD WITHOUT CONTRAST TECHNIQUE: Contiguous axial images were obtained from the base of the skull through the vertex without intravenous contrast. RADIATION DOSE REDUCTION: This exam was performed according to the departmental dose-optimization program which includes automated exposure control, adjustment of the mA and/or kV according to patient size and/or use of iterative reconstruction technique. COMPARISON:  Head CT 02/28/2022. FINDINGS: Brain: Encephalomalacia in the medial aspect of the right occipital lobe, unchanged. No evidence of acute infarction, hemorrhage, hydrocephalus, extra-axial collection or mass lesion/mass effect. Vascular: No hyperdense vessel or unexpected calcification. Skull: Normal. Negative for fracture or focal lesion. Sinuses/Orbits: No acute finding. Other: None. IMPRESSION: 1. No acute intracranial abnormalities. 2. Remote right occipital infarct, unchanged. Electronically Signed   By: Trudie Reed M.D.   On: 03/30/2022 12:22    PHYSICAL EXAM  Physical Exam  Constitutional: Appears well-developed and well-nourished.   Cardiovascular: Normal rate and regular rhythm.  Respiratory: Effort normal, non-labored breathing  Neuro: Mental Status: Patient is awake, alert, oriented to person, place, month, year, and situation. Impulsive, tearful Patient is able to give a clear and coherent history. No signs of aphasia or neglect, speech is slightly dysarthric Cranial Nerves: II: Visual Fields are full. Pupils are equal, round, and reactive to light.   III,IV, VI: EOMI without ptosis or diploplia.  V: Facial sensation is symmetric to temperature VII: Facial movement is symmetric resting and smiling VIII: Hearing is intact to voice X: Palate elevates symmetrically XI: Shoulder shrug is symmetric. XII: Tongue protrudes midline without atrophy or fasciculations.   Motor: Tone is normal.  Bulk is normal.  RUE 5/5 LUE 4/5- palmar drift RLE 2/5 LLE 3/5 Sensory: Sensation is symmetric to light touch and temperature in the arms Diminished sensation noted in the right leg and right foot Deep Tendon Reflexes: Right patellar 1+ Left patellar 2+ Cerebellar: FNF intact bilaterally Unable to complete HKS due to weakness        ASSESSMENT/PLAN Ms. MALETA PACHA is a 46 y.o. female with history of cocaine use, HTN, DM2, bipolar disorder, and previous stroke  presenting with right leg weakness. She states that she woke up unable to stand and then called 911.    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 CTA head & neck Moderate stenosis of the right PCA distal P1 segment, severe stenosis of the proximal P2 segment and occlusion of proximal P3 segment. Right mid ICA 70% stenosis and b/l A2/A3 stenosis MRI Cluster of acute infarctions at the left frontoparietal vertex. Punctate acute infarctions in the right medial parietal lobe.  2D Echo EF 70-75%, grade 2 diastolic dysfunction, left atria dilated  Vas lower extremity duplex- pending LDL pending HgbA1c 6.6 VTE prophylaxis - lovenox aspirin 81 mg daily prior to admission, now on aspirin 81 mg daily and clopidogrel 75 mg daily DAPT for 3 months and then Plavix alone given severe intracranial stenosis.  Therapy recommendations: SNF Disposition:  pending   Hx stroke/pseudostroke 12/2019 -admitted for right-sided weakness, numbness and slurred speech.  MRI showed acute/subacute demyelination vs Subacute infarction R middle cerebral peduncle.  MRA showed Multifocal intracranial atherosclerotic disease vs vasculitis vs cocaine induced vasculopathy, including left ICA siphon moderate stenosis, left M2, right P2 and left P4 moderate to severe stenosis.  Carotid Doppler negative.  EF 55 to 60%.  LP w/  slightly elevated protein 69, WBC 4, negative oligoclonol bands.  UDS positive for cocaine.  A1c 7.5, LDL 122.  ANA positive.  Patient left AMA at that time.  Patient diagnosis most likely due to ischemic infarction. 02/09/2020 admitted for right pontine infarct. CT showed right middle cerebellar peduncle old infarct. MRI acute R pontine infarct with evolving right cerebellar peduncle ischemic abnormality. LDL 75 and A1C 6.2. put on DAPT for 3 week then ASA alone. 08/2020 admitted for worsening weakness on the left.  Found to have extensive left-sided pain also.  MRI no acute infarct.  CT head and neck right ICA 50% stenosis, right P2 stenosis, multifocal intracranial stenosis.  LDL 102, A1c 7.3.  Patient continued on aspirin 81 03/2021 admitted again for confusion and slurred speech, with "baby voice".  MRI again no acute infarct. 10/2021 -ED visit for left-sided worsening weakness and left leg/foot pain.  CT head and neck no LVO and right ICA similar 50% stenosis.  MRI no acute infarct.  UDS positive for cocaine again.  Continued on aspirin and Lipitor 80/123 ED visit for left-sided weakness.  MRI negative for acute infarct.  ICA stenosis CTA head and neck 08/2020 right ICA 50% stenosis CTA head and neck 10/2021 similar right ICA 50% stenosis This time CTA showed right mid ICA 70% stenosis Asymptomatic at this time Outpatient follow-up with interventional neuroradiology  Hypertension Home meds:  amlodipine-benazepril, lisinopril  Stable Permissive hypertension (OK if < 220/120) but gradually normalize in 5-7 days Long-term BP goal normotensive  Hyperlipidemia LDL pending, goal < 70 Add Rosuvastatin 40mg   Continue statin at discharge  Diabetes type II Controlled Home meds:  glipizide HgbA1c 6.6,  goal < 7.0 CBGs SSI  Tobacco abuse Current smoker Smoking cessation counseling provided Nicotine patch provided Pt is willing to quit  Cocaine abuse Serial UDS all positive for  cocaine Cocaine cessation education provided Patient is willing to quit  Other Stroke Risk Factors ETOH use, alcohol level <10, advised to drink no more than 1 drink(s) a day Obesity, There is no height or weight on file to calculate BMI., BMI >/= 30 associated with increased stroke risk, recommend weight loss, diet and exercise as appropriate   Other Active Problems Bipolar disorder Seroquel resumed Chronic microcytic anemia   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  However exam concerning for psychogenic source.  EEG negative.  Discharged from ED. 02/12/2020 during admission again for unresponsiveness. EEG normal.   Hospital day # 0  Patient seen and examined by NP/APP with MD. MD to update note as needed.   Janine Ores, DNP, FNP-BC [facility] Pager: [phone]  ATTENDING NOTE: I reviewed above note and agree with the assessment and plan. Pt was seen and examined.   46 year old female with history of stroke, conversion disorder, pseudoseizure, hypertension, hyperlipidemia, diabetes, cocaine user, smoker, obesity and bipolar disorder admitted for right leg weakness and falling at home.  CT showed old right occipital infarct.  MRI showed bilateral ACA territory infarct, left more than right, old pontine and right cerebellar peduncle and right occipital infarcts.  CTA head and neck right P1/P2 and P3 stenosis/occlusion, right mid ICA 70% stenosis and moderate to severe bilateral A2/A3 stenosis.  EF 70 to 75%, LE venous Doppler pending.  LDL pending, A1c 6.6, UDS positive cocaine.  Creatinine 0.87, BP elevated.  On exam, patient no family at bedside.  She still has mild dysarthria but no significant facial droop.  Left upper extremity 5 -/5, right upper extremity 5/5.  Bilateral lower extremity 2/5 with lack of effort.  Sensation symmetrical.  Finger-to-nose grossly intact.  Etiology for patient stroke likely due to bilateral ICA stenosis secondary  to cocaine induced vasculopathy.  Cocaine cessation and smoking cessation education provided.  Recommend aspirin 81 and Plavix 75 DAPT for 3 months and then Plavix alone given severe intracranial stenosis.  On Crestor 40.  Right ICA progressive stenosis but still asymptomatic at this time, will follow-up with outpatient interventional neuroradiology.  PT therapy recommend SNF.  Aggressive risk factor modification.  Will follow.  For detailed assessment and plan, please refer to above/below as I have made changes wherever appropriate.   [name] Hawking, MD PhD Stroke Neurology 03/31/2022 7:12 PM    To contact Stroke Continuity provider, please refer to [email or web address] After hours, contact General Neurology

## 2022-03-31 NOTE — ED Notes (Signed)
Went outside to get patient after she left AMA. Admitting providers really want her to stay. Patient was cooperative, remorseful, and wanted to still be admitted. Transported her to room 40 and placed into gown. VSS reassessed and patient states pain free.  Pt states she has had a stroke in past and she is unsteady on feet.  Assisted patient to the bed with call light in reach.  Warm blankets provided times 3. CN notified admitted providers the patient has come back to stay for admission. Provided primary RN with a modified report.

## 2022-03-31 NOTE — ED Notes (Signed)
Patient placed in hospital bed

## 2022-03-31 NOTE — Progress Notes (Signed)
Daily Progress Note Intern Pager: [phone]  Patient name: Kaitlyn Gray Medical record number: [id number] Date of birth: 1975-11-07 Age: 46 y.o. Gender: female  Primary Care Provider: Patient, No Pcp Per Consultants: Neuro Code Status: Full  Pt Overview and Major Events to Date:  10/16 - admitted  Assessment and Plan:  LUE SYKORA is a 46 y.o. female presenting with right leg weakness since 10/14, found to have multiple acute embolic strokes on MRI.  Past medical history includes 2 prior CVAs (2021, 2022), HTN, T2DM, bipolar disorder, cocaine use.  * CVA (cerebral vascular accident) [facility]) Patient presented out of window for acute intervention. She has hx of prior CVA with some residual left-sided weakness from those. Now with worsened weakness and imaging findings of acute CVA. Neurology consulted in the ED. Echo with no evidence of shunt. -Nursing: Vitals per unit routine, out of bed with assistance only, neuro checks q4h -Continue cardiac monitoring -Appreciate neurology recs -F/u lipid panel  -F/u lower extremity vascular U/S -DAPT with ASA and Plavix x21 days -> ASA alone - PT, OT eval and treat - passed [facility] Swallow Screen -Permissive HTN up to 220/120  -Notify MD for BP's > 220/120 -Appreciate TOC involvement for healthcare barriers    Hypertension associated with diabetes ([place]) BP elevated 170-180s/90-130s on admission. Not on any medications given difficult social situation. Given acute CVA, will allow permissive HTN for 24 hours. -Monitor BP -Consider IV labetalol if BP rises above threshold   Diabetes mellitus without complication (HCC) Hgb O9G returned as 6.6, will not initiate insulin at this time. Reports she is supposed to take Metformin and Glipizide. Appears that she may have ran out of all of her medications.  -Heart healthy/carb modified diet -Continue Metformin -Hold glipizide   Microcytic anemia Hgb 7.9 with MCV 73.7. Appears chronic  over last couple of months. Has been prescribed iron in the past for low ferritin and iron saturation. She is due for colonoscopy, does not appear to have had one. Father passed from colon cancer.  -Daily CBCs -f/u Ferritin, Iron panel -Consider IV iron during hospitalization  -F/u with GI outpatient for colonoscopy  Bipolar disorder ([place]) Continue Seroquel.   Cocaine use disorder, severe, dependence ([place]) Reports last use 2 days ago. -TOC consulted for substance use resources  Cannabis abuse Intermittent use.  -Encourage cessation       FEN/GI: reg diet PPx: Lovenox Dispo:Pending PT recommendations .  Barriers include weakness due to stroke.   Subjective:  Pt seen in the PM after she had been moved up to the floor. She feels much better now that she's in a more comfortable room. Endorses continued weakness especially in lower extremities. Endorses chronic pain. Denies current CP/SOB, abd pain. Denies headache, vision changes, N/V.   Objective: Temp:  [97.5 F (36.4 C)-98.5 F (36.9 C)] 98.2 F (36.8 C) (10/16 1357) Pulse Rate:  [58-83] 66 (10/16 1357) Resp:  [17-21] 18 (10/16 1357) BP: (136-195)/(72-124) 181/89 (10/16 1357) SpO2:  [99 %-100 %] 100 % (10/16 1357) Physical Exam: General: NAD, alert and responsive, speech slightly slurred Cardiovascular: RRR, faint murmur appreciated. 2+ DP and radial pulses b/l Respiratory: CTAB normal WOB on RA Abdomen: soft NT/ND, normal BS Extremities:  RUE: 4/5 strength, sensation intact and equal w/ RUE LUE: 3/5 strength, sensation intact and equal w/ RUE RLE: 1-2/5 strength, decreased sensation below knee LLE: 2-3/5 strength, sensation intact Neuro: No obvious deficits noted in CN II-XII  Laboratory: Most recent CBC Lab  Results  Component Value Date   WBC 5.1 03/30/2022   HGB 7.9 (L) 03/30/2022   HCT 27.7 (L) 03/30/2022   MCV 73.7 (L) 03/30/2022   PLT 303 03/30/2022   Most recent BMP    Latest Ref Rng & Units  03/30/2022   10:58 AM  BMP  Glucose 70 - 99 mg/dL 630   BUN 6 - 20 mg/dL 15   Creatinine 1.60 - 1.00 mg/dL 1.09   Sodium 323 - 557 mmol/L 138   Potassium 3.5 - 5.1 mmol/L 3.6   Chloride 98 - 111 mmol/L 109   CO2 22 - 32 mmol/L 23   Calcium 8.9 - 10.3 mg/dL 8.6      Vonna Drafts, MD 03/31/2022, 2:40 PM  PGY-1, [facility] FPTS Intern pager: [phone], text pages welcome Secure chat group [facility] Teaching Service

## 2022-03-31 NOTE — Plan of Care (Signed)
  Problem: Education: Goal: Ability to describe self-care measures that may prevent or decrease complications (Diabetes Survival Skills Education) will improve Outcome: Progressing Goal: Individualized Educational Video(s) Outcome: Progressing   Problem: Coping: Goal: Ability to adjust to condition or change in health will improve Outcome: Progressing   Problem: Fluid Volume: Goal: Ability to maintain a balanced intake and output will improve Outcome: Progressing   Problem: Health Behavior/Discharge Planning: Goal: Ability to identify and utilize available resources and services will improve Outcome: Progressing Goal: Ability to manage health-related needs will improve Outcome: Progressing   Problem: Metabolic: Goal: Ability to maintain appropriate glucose levels will improve Outcome: Progressing   Problem: Nutritional: Goal: Maintenance of adequate nutrition will improve Outcome: Progressing Goal: Progress toward achieving an optimal weight will improve Outcome: Progressing   Problem: Skin Integrity: Goal: Risk for impaired skin integrity will decrease Outcome: Progressing   Problem: Tissue Perfusion: Goal: Adequacy of tissue perfusion will improve Outcome: Progressing   Problem: Education: Goal: Knowledge of General Education information will improve Description: Including pain rating scale, medication(s)/side effects and non-pharmacologic comfort measures Outcome: Progressing   Problem: Health Behavior/Discharge Planning: Goal: Ability to manage health-related needs will improve Outcome: Progressing   Problem: Clinical Measurements: Goal: Ability to maintain clinical measurements within normal limits will improve Outcome: Progressing Goal: Will remain free from infection Outcome: Progressing Goal: Diagnostic test results will improve Outcome: Progressing Goal: Respiratory complications will improve Outcome: Progressing Goal: Cardiovascular complication will  be avoided Outcome: Progressing   Problem: Activity: Goal: Risk for activity intolerance will decrease Outcome: Progressing   Problem: Nutrition: Goal: Adequate nutrition will be maintained Outcome: Progressing   Problem: Coping: Goal: Level of anxiety will decrease Outcome: Progressing   Problem: Elimination: Goal: Will not experience complications related to bowel motility Outcome: Progressing Goal: Will not experience complications related to urinary retention Outcome: Progressing   Problem: Pain Managment: Goal: General experience of comfort will improve Outcome: Progressing   Problem: Safety: Goal: Ability to remain free from injury will improve Outcome: Progressing   Problem: Skin Integrity: Goal: Risk for impaired skin integrity will decrease Outcome: Progressing   Problem: Education: Goal: Knowledge of disease or condition will improve Outcome: Progressing Goal: Knowledge of secondary prevention will improve (SELECT ALL) Outcome: Progressing Goal: Knowledge of patient specific risk factors will improve (INDIVIDUALIZE FOR PATIENT) Outcome: Progressing Goal: Individualized Educational Video(s) Outcome: Progressing   Problem: Coping: Goal: Will verbalize positive feelings about self Outcome: Progressing Goal: Will identify appropriate support needs Outcome: Progressing   Problem: Health Behavior/Discharge Planning: Goal: Ability to manage health-related needs will improve Outcome: Progressing   Problem: Self-Care: Goal: Ability to participate in self-care as condition permits will improve Outcome: Progressing Goal: Verbalization of feelings and concerns over difficulty with self-care will improve Outcome: Progressing Goal: Ability to communicate needs accurately will improve Outcome: Progressing   Problem: Nutrition: Goal: Risk of aspiration will decrease Outcome: Progressing Goal: Dietary intake will improve Outcome: Progressing   Problem:  Ischemic Stroke/TIA Tissue Perfusion: Goal: Complications of ischemic stroke/TIA will be minimized Outcome: Progressing   

## 2022-03-31 NOTE — ED Notes (Signed)
Patient refused to let me get her temp. Continues to take her leads off and her b/p cuff off.

## 2022-03-31 NOTE — Progress Notes (Signed)
FMTS Interim Progress Note  Called by RN due to patient leaving her room and reportedly cursing at staff and causing disruption. When I arrived in the ED, the patient had been wheeled out and her room was being cleaned. I went out to the lobby and found her sitting in a wheelchair outside the ED lobby. The patient was very emotional and stated that she was just hungry and still wished to stay in the hospital. She did not sign AMA papers. She stated she did not have a ride to go anywhere. Discussed with patient that we would try and work this out but she would need to be respectful with all members of her care team. She is remorseful and agreeable to this.  Spoke with ED attending and successfully re-admitted patient.   August Albino, MD 03/31/2022, 12:36 PM PGY-1, [place] [facility] Service pager [phone]

## 2022-04-01 ENCOUNTER — Inpatient Hospital Stay (HOSPITAL_COMMUNITY): Payer: Medicaid Other

## 2022-04-01 DIAGNOSIS — D649 Anemia, unspecified: Secondary | ICD-10-CM | POA: Diagnosis not present

## 2022-04-01 DIAGNOSIS — I63423 Cerebral infarction due to embolism of bilateral anterior cerebral arteries: Secondary | ICD-10-CM | POA: Diagnosis not present

## 2022-04-01 DIAGNOSIS — I639 Cerebral infarction, unspecified: Secondary | ICD-10-CM | POA: Diagnosis not present

## 2022-04-01 DIAGNOSIS — I63523 Cerebral infarction due to unspecified occlusion or stenosis of bilateral anterior cerebral arteries: Secondary | ICD-10-CM | POA: Diagnosis not present

## 2022-04-01 DIAGNOSIS — F319 Bipolar disorder, unspecified: Secondary | ICD-10-CM | POA: Diagnosis not present

## 2022-04-01 DIAGNOSIS — F1210 Cannabis abuse, uncomplicated: Secondary | ICD-10-CM | POA: Diagnosis not present

## 2022-04-01 DIAGNOSIS — Z8673 Personal history of transient ischemic attack (TIA), and cerebral infarction without residual deficits: Secondary | ICD-10-CM

## 2022-04-01 DIAGNOSIS — F1420 Cocaine dependence, uncomplicated: Secondary | ICD-10-CM | POA: Diagnosis not present

## 2022-04-01 LAB — CBC
HCT: 26.6 % — ABNORMAL LOW (ref 36.0–46.0)
Hemoglobin: 7.7 g/dL — ABNORMAL LOW (ref 12.0–15.0)
MCH: 20.8 pg — ABNORMAL LOW (ref 26.0–34.0)
MCHC: 28.9 g/dL — ABNORMAL LOW (ref 30.0–36.0)
MCV: 71.7 fL — ABNORMAL LOW (ref 80.0–100.0)
Platelets: 299 10*3/uL (ref 150–400)
RBC: 3.71 MIL/uL — ABNORMAL LOW (ref 3.87–5.11)
RDW: 18.6 % — ABNORMAL HIGH (ref 11.5–15.5)
WBC: 4.6 10*3/uL (ref 4.0–10.5)
nRBC: 0 % (ref 0.0–0.2)

## 2022-04-01 LAB — BASIC METABOLIC PANEL
Anion gap: 7 (ref 5–15)
BUN: 19 mg/dL (ref 6–20)
CO2: 26 mmol/L (ref 22–32)
Calcium: 8.9 mg/dL (ref 8.9–10.3)
Chloride: 109 mmol/L (ref 98–111)
Creatinine, Ser: 0.91 mg/dL (ref 0.44–1.00)
GFR, Estimated: >60 mL/min (ref >60)
Glucose, Bld: 105 mg/dL — ABNORMAL HIGH (ref 70–99)
Potassium: 3.5 mmol/L (ref 3.5–5.1)
Sodium: 142 mmol/L (ref 135–145)

## 2022-04-01 LAB — LIPID PANEL
Cholesterol: 151 mg/dL (ref 0–200)
HDL: 44 mg/dL (ref >40)
LDL Cholesterol: 77 mg/dL (ref 0–99)
Total CHOL/HDL Ratio: 3.4 RATIO
Triglycerides: 151 mg/dL — ABNORMAL HIGH (ref <150)
VLDL: 30 mg/dL (ref 0–40)

## 2022-04-01 LAB — IRON AND TIBC
Iron: 22 ug/dL — ABNORMAL LOW (ref 28–170)
Saturation Ratios: 6 % — ABNORMAL LOW (ref 10.4–31.8)
TIBC: 354 ug/dL (ref 250–450)
UIBC: 332 ug/dL

## 2022-04-01 LAB — FERRITIN: Ferritin: 5 ng/mL — ABNORMAL LOW (ref 11–307)

## 2022-04-01 MED ORDER — LIDOCAINE 5 % EX PTCH
1.0000 | MEDICATED_PATCH | CUTANEOUS | Status: DC
  Administered 2022-04-01 – 2022-04-03 (×3): 1 via TRANSDERMAL
  Filled 2022-04-01 (×4): qty 1

## 2022-04-01 MED ORDER — POLYETHYLENE GLYCOL 3350 17 G PO PACK
17.0000 g | PACK | Freq: Every day | ORAL | Status: DC
  Administered 2022-04-01 – 2022-04-02 (×2): 17 g via ORAL
  Filled 2022-04-01 (×2): qty 1

## 2022-04-01 MED ORDER — FERROUS SULFATE 325 (65 FE) MG PO TABS
325.0000 mg | ORAL_TABLET | ORAL | Status: DC
  Administered 2022-04-01: 325 mg via ORAL
  Filled 2022-04-01 (×3): qty 1

## 2022-04-01 MED ORDER — SENNOSIDES-DOCUSATE SODIUM 8.6-50 MG PO TABS
1.0000 | ORAL_TABLET | Freq: Every day | ORAL | Status: DC
  Filled 2022-04-01: qty 1

## 2022-04-01 MED ORDER — BISACODYL 10 MG RE SUPP
10.0000 mg | Freq: Once | RECTAL | Status: AC
  Administered 2022-04-01: 10 mg via RECTAL
  Filled 2022-04-01: qty 1

## 2022-04-01 NOTE — Plan of Care (Signed)
  Problem: Education: Goal: Knowledge of General Education information will improve Description: Including pain rating scale, medication(s)/side effects and non-pharmacologic comfort measures Outcome: Progressing   Problem: Health Behavior/Discharge Planning: Goal: Ability to manage health-related needs will improve Outcome: Progressing   Problem: Activity: Goal: Risk for activity intolerance will decrease Outcome: Progressing   Problem: Nutrition: Goal: Adequate nutrition will be maintained Outcome: Progressing   Problem: Education: Goal: Knowledge of disease or condition will improve Outcome: Progressing Goal: Knowledge of secondary prevention will improve (SELECT ALL) Outcome: Progressing Goal: Knowledge of patient specific risk factors will improve (INDIVIDUALIZE FOR PATIENT) Outcome: Progressing Goal: Individualized Educational Video(s) Outcome: Progressing

## 2022-04-01 NOTE — Evaluation (Signed)
Physical Therapy Evaluation Patient Details Name: Kaitlyn Gray MRN: [id number] DOB: 1975-08-20 Today's Date: 04/01/2022  History of Present Illness  Pt is a 46 y/o female who presented with RLE weakness. MRI brain showed multiple infarcts of L frontoparietal vertex. PMH:  two prior CVA (2021, 2022), HTN, T2DM, bipolar  Clinical Impression  Received pt setting off bed alarm with LLE hanging off EOB. Pt agreeable to PT evaluation but demonstrating mild impulsivity and decreased insight into deficits. Pt required mod A overall for bed mobility with total A to advance RLE due to weakness. Pt stood from EOB with RW and mod A, but reported feeling like she was going to fall - therefore returned to sitting EOB. Pt reported her RLE felt weaker today > yesterday. Pt reports living alone and using rollator for mobility. Pt sleeps on the couch downstairs and occasionally goes upstairs to shower. Pt fixated on eating breakfast; therefore left in bed eating breakfast with all needs met. Recommend SNF at this time due to current impairments. Acute PT to cont to follow.      Recommendations for follow up therapy are one component of a multi-disciplinary discharge planning process, led by the attending physician.  Recommendations may be updated based on patient status, additional functional criteria and insurance authorization.  Follow Up Recommendations Skilled nursing-short term rehab (<3 hours/day) Can patient physically be transported by private vehicle: No    Assistance Recommended at Discharge Frequent or constant Supervision/Assistance  Patient can return home with the following  A lot of help with walking and/or transfers;A lot of help with bathing/dressing/bathroom;Assistance with cooking/housework;Direct supervision/assist for medications management;Direct supervision/assist for financial management;Assist for transportation;Help with stairs or ramp for entrance    Equipment Recommendations  Other (comment) (TBD)  Recommendations for Other Services       Functional Status Assessment Patient has had a recent decline in their functional status and demonstrates the ability to make significant improvements in function in a reasonable and predictable amount of time.     Precautions / Restrictions Precautions Precautions: Fall;Other (comment) Precaution Comments: high fall risk, R hemiparesis Restrictions Weight Bearing Restrictions: No      Mobility  Bed Mobility Overal bed mobility: Needs Assistance Bed Mobility: Supine to Sit, Sit to Supine     Supine to sit: Mod assist Sit to supine: Mod assist   General bed mobility comments: mod A to advance RLE off EOB but able to bring trunk upright without assist. Pt required mod A for BLE management for sit<>supine and required min/light mod A to scoot to [facility] Patient Response: Impulsive  Transfers Overall transfer level: Needs assistance Equipment used: Rolling walker (2 wheels) Transfers: Sit to/from Stand Sit to Stand: Mod assist           General transfer comment: stood from EOB with RW (rollator brakes broken) and light mod A. Pt reported feeling like she was going to fall (guarding R knee but no true buckling) and requested to return to bed.    Ambulation/Gait                  Stairs            Wheelchair Mobility    Modified Rankin (Stroke Patients Only) Modified Rankin (Stroke Patients Only) Pre-Morbid Rankin Score: Moderate disability Modified Rankin: Severe disability     Balance Overall balance assessment: Needs assistance Sitting-balance support: Feet supported, No upper extremity supported Sitting balance-Leahy Scale: Fair     Standing balance support: Bilateral upper  extremity supported, Reliant on assistive device for balance (RW) Standing balance-Leahy Scale: Poor Standing balance comment: required light min A for static standing balance                              Pertinent Vitals/Pain Pain Assessment Pain Assessment: 0-10 Pain Score: 8  Pain Location: stomach Pain Descriptors / Indicators: Discomfort, Other (Comment) (has been nauseous up until last night) Pain Intervention(s): Limited activity within patient's tolerance, Monitored during session, Repositioned    Home Living Family/patient expects to be discharged to:: Private residence Living Arrangements: Alone Available Help at Discharge: Other (Comment) (aunt lives in [place]) Type of Home: Other(Comment) (duplex) Home Access: Stairs to enter Entrance Stairs-Rails: Right Entrance Stairs-Number of Steps: 5 steps Alternate Level Stairs-Number of Steps: 13 Home Layout: Two level;Bed/bath upstairs Home Equipment: Rollator (4 wheels);BSC/3in1      Prior Function Prior Level of Function : Independent/Modified Independent             Mobility Comments: use of Rollator for mobility (brakes are broken) ADLs Comments: uses BSC and sleeps on couch primarily. does go upstairs for showers intermittently     Hand Dominance   Dominant Hand: Right    Extremity/Trunk Assessment   Upper Extremity Assessment Upper Extremity Assessment: Defer to OT evaluation    Lower Extremity Assessment Lower Extremity Assessment: RLE deficits/detail;LLE deficits/detail RLE Deficits / Details: RLE extremly weak, requiring assist from therapist to advance LE off EOB RLE Sensation: decreased light touch RLE Coordination: decreased gross motor LLE Sensation: decreased light touch    Cervical / Trunk Assessment Cervical / Trunk Assessment: Normal  Communication   Communication: Other (comment) (gaps in speech at times and difficulty with word production (slurred speech overall))  Cognition Arousal/Alertness: Awake/alert Behavior During Therapy: Impulsive Overall Cognitive Status: History of cognitive impairments - at baseline                                 General Comments: bed alarm  going off upon therapist's entry with pt hanging legs off EOB attempting to put on wig. Pt with poor insight into deficits, safety and health literacy.        General Comments General comments (skin integrity, edema, etc.): pt cooperative and able to follow cues    Exercises     Assessment/Plan    PT Assessment Patient needs continued PT services  PT Problem List Decreased strength;Decreased range of motion;Decreased activity tolerance;Decreased balance;Decreased mobility;Decreased coordination;Impaired sensation;Cardiopulmonary status limiting activity;Decreased safety awareness;Decreased knowledge of use of DME;Decreased cognition;Pain       PT Treatment Interventions DME instruction;Gait training;Stair training;Functional mobility training;Therapeutic activities;Therapeutic exercise;Wheelchair mobility training;Patient/family education;Cognitive remediation;Neuromuscular re-education;Balance training    PT Goals (Current goals can be found in the Care Plan section)  Acute Rehab PT Goals Patient Stated Goal: to get stronger PT Goal Formulation: With patient Time For Goal Achievement: 04/15/22 Potential to Achieve Goals: Good    Frequency Min 3X/week     Co-evaluation               AM-PAC PT "6 Clicks" Mobility  Outcome Measure Help needed turning from your back to your side while in a flat bed without using bedrails?: A Lot Help needed moving from lying on your back to sitting on the side of a flat bed without using bedrails?: A Lot Help needed moving to and from a  bed to a chair (including a wheelchair)?: A Lot Help needed standing up from a chair using your arms (e.g., wheelchair or bedside chair)?: A Lot Help needed to walk in hospital room?: Total Help needed climbing 3-5 steps with a railing? : Total 6 Click Score: 10    End of Session Equipment Utilized During Treatment: Gait belt Activity Tolerance: Patient tolerated treatment well Patient left: in bed;with  call bell/phone within reach;with bed alarm set Nurse Communication: Mobility status PT Visit Diagnosis: Unsteadiness on feet (R26.81);Other abnormalities of gait and mobility (R26.89);Muscle weakness (generalized) (M62.81);History of falling (Z91.81);Hemiplegia and hemiparesis;Pain Hemiplegia - Right/Left: Right Hemiplegia - dominant/non-dominant: Dominant Hemiplegia - caused by: Cerebral infarction Pain - Right/Left:  (stomach) Pain - part of body:  (stomach)    Time: 1040-4591 PT Time Calculation (min) (ACUTE ONLY): 24 min   Charges:   PT Evaluation $PT Eval Moderate Complexity: 1 Mod PT Treatments $Therapeutic Activity: 8-22 mins        Becky Sax PT, DPT  Blenda Nicely 04/01/2022, 8:34 AM

## 2022-04-01 NOTE — Progress Notes (Signed)
STROKE TEAM PROGRESS NOTE   INTERVAL HISTORY No family is at the bedside.  She was sleeping, easily arousable. Neuro stable. Still complains of b/l LE weakness but of note, she was able to walk to outside ED lobby with her walker tyring to sign AMA.   Vitals:   04/01/22 0745 04/01/22 1100 04/01/22 1144 04/01/22 1532  BP: (!) 168/81  (!) 185/144 (!) 157/101  Pulse: 73  71 70  Resp: 17  17 (!) 21  Temp: 98.1 F (36.7 C)  98.6 F (37 C) 97.8 F (36.6 C)  TempSrc: Oral  Oral Oral  SpO2: 100%  100% 100%  Weight:  90.6 kg    Height:  5\' 4"  (1.626 m)     CBC:  Recent Labs  Lab 03/30/22 1058 04/01/22 0345  WBC 5.1 4.6  HGB 7.9* 7.7*  HCT 27.7* 26.6*  MCV 73.7* 71.7*  PLT 303 956   Basic Metabolic Panel:  Recent Labs  Lab 03/30/22 1058 04/01/22 0345  NA 138 142  K 3.6 3.5  CL 109 109  CO2 23 26  GLUCOSE 161* 105*  BUN 15 19  CREATININE 0.87 0.91  CALCIUM 8.6* 8.9   Lipid Panel:  Recent Labs  Lab 04/01/22 0345  CHOL 151  TRIG 151*  HDL 44  CHOLHDL 3.4  VLDL 30  LDLCALC 77   HgbA1c:  Recent Labs  Lab 03/30/22 2234  HGBA1C 6.6*   Urine Drug Screen:  Recent Labs  Lab 03/30/22 1039  LABOPIA NONE DETECTED  COCAINSCRNUR POSITIVE*  LABBENZ NONE DETECTED  AMPHETMU NONE DETECTED  THCU NONE DETECTED  LABBARB NONE DETECTED    Alcohol Level No results for input(s): "ETH" in the last 168 hours.  IMAGING past 24 hours VAS [place] LOWER EXTREMITY VENOUS (DVT)  Result Date: 04/01/2022  Lower Venous DVT Study Patient Name:  Kaitlyn Gray  Date of Exam:   04/01/2022 Medical Rec #: [id number]          Accession #:    [id number] Date of Birth: 19-Oct-1975          Patient Gender: F Patient Age:   46 years Exam Location:  [facility] Procedure:      VAS [place] LOWER EXTREMITY VENOUS (DVT) Referring Phys: Cornelius Moras Mell Mellott --------------------------------------------------------------------------------  Indications: Stroke.  Comparison Study: No previous exams Performing  Technologist: Jody Hill RVT, RDMS  Examination Guidelines: A complete evaluation includes B-mode imaging, spectral Doppler, color Doppler, and power Doppler as needed of all accessible portions of each vessel. Bilateral testing is considered an integral part of a complete examination. Limited examinations for reoccurring indications may be performed as noted. The reflux portion of the exam is performed with the patient in reverse Trendelenburg.  +---------+---------------+---------+-----------+----------+--------------+ RIGHT    CompressibilityPhasicitySpontaneityPropertiesThrombus Aging +---------+---------------+---------+-----------+----------+--------------+ CFV      Full           Yes      Yes                                 +---------+---------------+---------+-----------+----------+--------------+ SFJ      Full                                                        +---------+---------------+---------+-----------+----------+--------------+ FV Prox  Full           Yes      Yes                                 +---------+---------------+---------+-----------+----------+--------------+ FV Mid   Full           Yes      Yes                                 +---------+---------------+---------+-----------+----------+--------------+ FV DistalFull           Yes      Yes                                 +---------+---------------+---------+-----------+----------+--------------+ PFV      Full                                                        +---------+---------------+---------+-----------+----------+--------------+ POP      Full           Yes      Yes                                 +---------+---------------+---------+-----------+----------+--------------+ PTV      Full                                                        +---------+---------------+---------+-----------+----------+--------------+ PERO     Full                                                         +---------+---------------+---------+-----------+----------+--------------+   +---------+---------------+---------+-----------+----------+--------------+ LEFT     CompressibilityPhasicitySpontaneityPropertiesThrombus Aging +---------+---------------+---------+-----------+----------+--------------+ CFV      Full           Yes      Yes                                 +---------+---------------+---------+-----------+----------+--------------+ SFJ      Full                                                        +---------+---------------+---------+-----------+----------+--------------+ FV Prox  Full           Yes      Yes                                 +---------+---------------+---------+-----------+----------+--------------+ FV Mid   Full  Yes      Yes                                 +---------+---------------+---------+-----------+----------+--------------+ FV DistalFull           Yes      Yes                                 +---------+---------------+---------+-----------+----------+--------------+ PFV      Full                                                        +---------+---------------+---------+-----------+----------+--------------+ POP      Full           Yes      Yes                                 +---------+---------------+---------+-----------+----------+--------------+ PTV      Full                                                        +---------+---------------+---------+-----------+----------+--------------+ PERO     Full                                                        +---------+---------------+---------+-----------+----------+--------------+     Summary: BILATERAL: - No evidence of deep vein thrombosis seen in the lower extremities, bilaterally. -No evidence of popliteal cyst, bilaterally.   *See table(s) above for measurements and observations. Electronically signed by Lemar Livings MD on  04/01/2022 at 3:27:04 PM.    Final     PHYSICAL EXAM  Physical Exam  Constitutional: Appears well-developed and well-nourished.   Cardiovascular: Normal rate and regular rhythm.  Respiratory: Effort normal, non-labored breathing  Neuro: Mental Status: Patient is awake, alert, oriented to person, place, month, year, and situation. Impulsive, tearful Patient is able to give a clear and coherent history. No signs of aphasia or neglect, speech is slightly dysarthric Cranial Nerves: II: Visual Fields are full. Pupils are equal, round, and reactive to light.   III,IV, VI: EOMI without ptosis or diploplia.  V: Facial sensation is symmetric to temperature VII: Facial movement is symmetric resting and smiling VIII: Hearing is intact to voice X: Palate elevates symmetrically XI: Shoulder shrug is symmetric. XII: Tongue protrudes midline without atrophy or fasciculations.  Motor: Tone is normal. Bulk is normal.  RUE 5/5    LUE 4/5- palmar drift RLE 2/5 lack of effort  LLE 3/5 lack of effort Sensory: Sensation is symmetric to light touch and temperature in the arms Diminished sensation noted in the right leg and right foot Deep Tendon Reflexes: Right patellar 1+ Left patellar 2+ Cerebellar: FNF intact bilaterally Unable to complete HKS due to weakness   ASSESSMENT/PLAN Kaitlyn Gray is a 46 y.o. female  with history of cocaine use, HTN, DM2, bipolar disorder, and previous stroke  presenting with right leg weakness. She states that she woke up unable to stand and then called 911.    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 CTA head & neck Moderate stenosis of the right PCA distal P1 segment, severe stenosis of the proximal P2 segment and occlusion of proximal P3 segment. Right mid ICA 70% stenosis and b/l A2/A3 stenosis MRI Cluster of acute  infarctions at the left frontoparietal vertex. Punctate acute infarctions in the right medial parietal lobe.  2D Echo EF 70-75%, grade 2 diastolic dysfunction, left atria dilated  Vas lower extremity duplex no DVT LDL 77 HgbA1c 6.6 VTE prophylaxis - lovenox aspirin 81 mg daily prior to admission, now on aspirin 81 mg daily and clopidogrel 75 mg daily DAPT for 3 months and then Plavix alone given severe intracranial stenosis.  Therapy recommendations: SNF Disposition:  pending   Hx stroke/pseudostroke 12/2019 -admitted for right-sided weakness, numbness and slurred speech.  MRI showed acute/subacute demyelination vs Subacute infarction R middle cerebral peduncle.  MRA showed Multifocal intracranial atherosclerotic disease vs vasculitis vs cocaine induced vasculopathy, including left ICA siphon moderate stenosis, left M2, right P2 and left P4 moderate to severe stenosis.  Carotid Doppler negative.  EF 55 to 60%.  LP w/ slightly elevated protein 69, WBC 4, negative oligoclonol bands.  UDS positive for cocaine.  A1c 7.5, LDL 122.  ANA positive.  Patient left AMA at that time.  Patient diagnosis most likely due to ischemic infarction. 02/09/2020 admitted for right pontine infarct. CT showed right middle cerebellar peduncle old infarct. MRI acute R pontine infarct with evolving right cerebellar peduncle ischemic abnormality. LDL 75 and A1C 6.2. put on DAPT for 3 week then ASA alone. 08/2020 admitted for worsening weakness on the left.  Found to have extensive left-sided pain also.  MRI no acute infarct.  CT head and neck right ICA 50% stenosis, right P2 stenosis, multifocal intracranial stenosis.  LDL 102, A1c 7.3.  Patient continued on aspirin 81 03/2021 admitted again for confusion and slurred speech, with "baby voice".  MRI again no acute infarct. 10/2021 -ED visit for left-sided worsening weakness and left leg/foot pain.  CT head and neck no LVO and right ICA similar 50% stenosis.  MRI no acute infarct.   UDS positive for cocaine again.  Continued on aspirin and Lipitor 80/123 ED visit for left-sided weakness.  MRI negative for acute infarct.  ICA stenosis CTA head and neck 08/2020 right ICA 50% stenosis CTA head and neck 10/2021 similar right ICA 50% stenosis This time CTA showed right mid ICA 70% stenosis Asymptomatic at this time Outpatient follow-up with interventional neuroradiology  Hypertension Home meds:  amlodipine-benazepril, lisinopril  Stable Gradually normalize in 5-7 days Long-term BP goal normotensive  Hyperlipidemia LDL 77, goal < 70 Add Rosuvastatin 40mg   Continue statin at discharge  Diabetes type II Controlled Home meds:  glipizide HgbA1c 6.6, goal < 7.0 CBGs SSI  Tobacco abuse Current smoker Smoking cessation counseling provided Nicotine patch provided Pt is willing to quit  Cocaine abuse Serial UDS all positive for cocaine Cocaine cessation education provided Patient is willing to quit  Other Stroke Risk Factors ETOH use, alcohol level <10, advised to drink no more than 1 drink(s) a day Obesity, Body mass index is 34.28 kg/m., BMI >/= 30 associated with increased stroke risk, recommend  weight loss, diet and exercise as appropriate   Other Active Problems Bipolar disorder Seroquel resumed Chronic microcytic anemia   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  However exam concerning for psychogenic source.  EEG negative.  Discharged from ED. 02/12/2020 during admission again for unresponsiveness. EEG normal.   Hospital day # 1  Neurology will sign off. Please call with questions. Pt will follow up with stroke clinic NP at [facility] in about 4 weeks. Thanks for the consult.   Marvel Plan, MD PhD Stroke Neurology 04/01/2022 5:55 PM    To contact Stroke Continuity provider, please refer to [email or web address]. After hours, contact General Neurology

## 2022-04-01 NOTE — Progress Notes (Signed)
Daily Progress Note Intern Pager: [phone]  Patient name: Kaitlyn Gray Medical record number: [id number] Date of birth: 09-27-1975 Age: 46 y.o. Gender: female  Primary Care Provider: Patient, No Pcp Per Consultants: Neuro Code Status: Full  Pt Overview and Major Events to Date:  10/16-admitted  Assessment and Plan:  SHALAWN STAVROPOULOS is a 46 y.o. female presenting with right leg weakness since 10/14, found to have multiple acute embolic strokes on MRI.  Past medical history includes 2 prior CVAs (2021, 2022), HTN, T2DM, bipolar disorder, cocaine use.    * CVA (cerebral vascular accident) ([place]) P/w right sided weakness with chronic left sided weakness from prior stroke. Neuro exam stable from yesterday. Per neuro, likely b/l ICA stenosis 2/2 cocaine induced vasculopathy. LDL 77 -Nursing: Vitals per unit routine, out of bed with assistance only, neuro checks q4h -Continue cardiac monitoring -Appreciate neurology recs  -F/u lower extremity vascular U/S  -DAPT with ASA and Plavix x3 months -> ASA alone  -outpatient f/u with interventional neuroradiology  -Add rosuvastatin 40mg , goal LDL <70 - PT, OT eval and treat  -recommend SNF, TOC consulted -Permissive HTN up to 220/120  -Notify MD for BP's > 220/120 -Appreciate TOC involvement for healthcare barriers    Hypertension associated with diabetes ([place]) BP elevated 170-180s/90-130s on admission. Not on any medications given difficult social situation. Given acute CVA, will allow permissive HTN. -Monitor BP -Consider IV labetalol if BP rises above threshold   Diabetes mellitus without complication (HCC) Hgb 123456 returned as 6.6, will not initiate insulin at this time. Reports she is supposed to take Metformin and Glipizide. Appears that she may have ran out of all of her medications.  -Heart healthy/carb modified diet -Continue Metformin -Hold glipizide   Microcytic anemia Low iron, ferritin, and %sat. She is due  for colonoscopy, does not appear to have had one. Father passed from colon cancer.  -Daily CBCs -Start oral iron supplementation -F/u with GI outpatient for colonoscopy  Polysubstance abuse ([place]) Avoiding narcotics -tylenol, lidocaine patch for chronic pain  Bipolar disorder (HCC) Continue Seroquel.   Cocaine use disorder, severe, dependence ([place]) Reports last use 2 days prior to admission. -TOC consulted for substance use resources  Cannabis abuse Intermittent use.  -Encourage cessation       FEN/GI: reg diet, sch miralax and senna PPx: Lovenox Dispo: SNF pending clinical improvement and placement  Subjective:  Patient reports feeling well this morning, just after PT session.  Says that her right leg weakness is a little bit worse than yesterday.  Having some abdominal pain which she thinks may be related to constipation.  Endorses chronic pain, amenable to trying Tylenol and lidocaine patch, but says that oxycodone is what would work best for her.  Denies CP/SOB, headaches, vision changes.  Some N/V yesterday improved with Zofran.  Objective: Temp:  [97.5 F (36.4 C)-99.2 F (37.3 C)] 98.1 F (36.7 C) (10/17 0745) Pulse Rate:  [66-80] 73 (10/17 0745) Resp:  [17-21] 17 (10/17 0745) BP: (136-195)/(73-124) 168/81 (10/17 0745) SpO2:  [95 %-100 %] 100 % (10/17 0745) Physical Exam: General: NAD, resting comfortably in bed Cardiovascular: RRR, systolic murmur appreciated Respiratory: CTAB normal WOB on RA Abdomen: Soft, mild discomfort to palpation in lower quadrants, nondistended, no R/G Neuro: Stable from prior exam. No new deficits.   Laboratory: Most recent CBC Lab Results  Component Value Date   WBC 4.6 04/01/2022   HGB 7.7 (L) 04/01/2022   HCT 26.6 (L) 04/01/2022   MCV  71.7 (L) 04/01/2022   PLT 299 04/01/2022   Most recent BMP    Latest Ref Rng & Units 04/01/2022    3:45 AM  BMP  Glucose 70 - 99 mg/dL 105   BUN 6 - 20 mg/dL 19   Creatinine 0.44 - 1.00  mg/dL 0.91   Sodium 135 - 145 mmol/L 142   Potassium 3.5 - 5.1 mmol/L 3.5   Chloride 98 - 111 mmol/L 109   CO2 22 - 32 mmol/L 26   Calcium 8.9 - 10.3 mg/dL 8.9    Iron 22, TIBC WNL Ferritin 5 Sat 6%  TC 151 LDL 77 HDL 44 Triglycerides 151  Imaging/Diagnostic Tests:  CT angio head neck IMPRESSION: Moderate stenosis of the right PCA distal P1 segment, severe stenosis of the proximal P2 segment and occlusion of proximal P3 segment.  ADDENDUM: Moderate to severe stenosis in the distal A2 or proximal A3 (series 6, image 255). The distal ACAs are somewhat irregular.   As described in the body of the report, there is approximately 70% stenosis in the mid right extracranial ICA.  August Albino, MD 04/01/2022, 10:44 AM  PGY-1, [place] Intern pager: [phone], text pages welcome Secure chat group [place]

## 2022-04-01 NOTE — Progress Notes (Signed)
FMTS Interim Progress Note  S: Was messaged by nursing about patient having a fall while she was going unattended to the bathroom.  I went to assess patient and she did not hit her head.  She says that her right leg gave out on her and that she fell forward onto her hands.  She denies any pain on her extremities.  She denies any headache or dizziness or chest pain prior to fall or currently.  Denies any palpitations.   O: BP (!) 157/101 (BP Location: Left Arm)   Pulse 70   Temp 97.8 F (36.6 C) (Oral)   Resp (!) 21   Ht 5\' 4"  (1.626 m)   Wt 90.6 kg   SpO2 100%   BMI 34.28 kg/m     Neuro: Alert and oriented x4  CN II: PERRL CN III, IV,VI: EOMI CV V: Decreased sensation on right side of face in comparison to left V1, V2, V3 CVII: Symmetric smile and brow raise CN VIII: Normal hearing CN IX,X: Symmetric palate raise  CN XI: shoulder shrug higher on right side than left CN XII: Symmetric tongue protrusion  BUE 4/5 in and RLE strength 2/5, LLE 2/5 Normal sensation in LE bilaterally  Pronator drift negative  A/P: S/p fall Likely in the setting of patient being unattended and has significant right lower knee weakness.  Similar to prior examinations.  No new focal neurologic deficits on examination.  Denies hitting head.  Less likely to be syncope as patient does not have dizziness or chest pain prior to fall. -Fall precautions -OOB with assistance only -PT/OT -Monitor neurologic examinations  Gerrit Heck, MD 04/01/2022, 4:22 PM PGY-2, [place] Medicine Service pager [phone]

## 2022-04-01 NOTE — Progress Notes (Signed)
Patient was found on the floor by nursing staff.  Patient stated she was trying to go to the bathroom and her right leg gave out.  She denies any pain, head trauma, LOC or dizziness.  Vital signs were stable. Patient placed back into the bed and bed alarm is on.  Provider has been notified.  Will continue to monitor.

## 2022-04-01 NOTE — Assessment & Plan Note (Signed)
Avoiding narcotics -tylenol, lidocaine patch for chronic pain

## 2022-04-01 NOTE — Progress Notes (Signed)
BLE venous duplex has been completed.  Results can be found under chart review under CV PROC. 04/01/2022 2:13 PM Levaeh Vice RVT, RDMS

## 2022-04-02 ENCOUNTER — Inpatient Hospital Stay (HOSPITAL_COMMUNITY): Payer: Medicaid Other

## 2022-04-02 DIAGNOSIS — I63523 Cerebral infarction due to unspecified occlusion or stenosis of bilateral anterior cerebral arteries: Secondary | ICD-10-CM | POA: Diagnosis not present

## 2022-04-02 DIAGNOSIS — F1420 Cocaine dependence, uncomplicated: Secondary | ICD-10-CM | POA: Diagnosis not present

## 2022-04-02 DIAGNOSIS — I639 Cerebral infarction, unspecified: Secondary | ICD-10-CM | POA: Diagnosis not present

## 2022-04-02 DIAGNOSIS — F319 Bipolar disorder, unspecified: Secondary | ICD-10-CM | POA: Diagnosis not present

## 2022-04-02 MED ORDER — SIMETHICONE 80 MG PO CHEW
80.0000 mg | CHEWABLE_TABLET | Freq: Once | ORAL | Status: AC
  Administered 2022-04-03: 80 mg via ORAL
  Filled 2022-04-02: qty 1

## 2022-04-02 MED ORDER — CLOPIDOGREL BISULFATE 75 MG PO TABS
75.0000 mg | ORAL_TABLET | Freq: Every day | ORAL | Status: DC
  Administered 2022-04-02 – 2022-04-04 (×3): 75 mg via ORAL
  Filled 2022-04-02 (×3): qty 1

## 2022-04-02 MED ORDER — POLYETHYLENE GLYCOL 3350 17 G PO PACK: 17.0000 g | PACK | Freq: Two times a day (BID) | ORAL | Status: DC

## 2022-04-02 MED ORDER — BISACODYL 10 MG RE SUPP
10.0000 mg | Freq: Once | RECTAL | Status: AC
  Administered 2022-04-02: 10 mg via RECTAL
  Filled 2022-04-02: qty 1

## 2022-04-02 NOTE — Progress Notes (Signed)
SLP Cancellation Note  Patient Details Name: Kaitlyn Gray MRN: [id number] DOB: [birth year]   Cancelled treatment:       Reason Eval/Treat Not Completed: Patient declined, no reason specified;Fatigue/lethargy limiting ability to participate (Pt approached for speech-language-cognition evaluation. Pt was lethargic and exhibited difficulty maintaining alertness, but stated that is not feeling well and requested that SLP "come back later". SLP will follow up, but considering her lethargy, this likely be on a subsequent date.)  Oni Dietzman I. Hardin Negus, [place], [place] Office number [phone]  Horton Marshall 04/02/2022, 10:49 AM

## 2022-04-02 NOTE — TOC Initial Note (Signed)
Transition of Care [facility]) - Initial/Assessment Note    Patient Details  Name: Kaitlyn Gray MRN: [id number] Date of Birth: [birth year]  [facility] CM/SW Contact:    Geralynn Ochs, LCSW Phone Number: 04/02/2022, 3:16 PM  Clinical Narrative:                CSW met with patient to discuss social concerns and recommendation for SNF. Patient in agreement to SNF, and CSW explained difficulty of SNF placement because of patient's payor and substance use. Patient discussed needing caregiver support if she goes home instead of SNF, and CSW discussed process for applying for PCS through Medicaid. CSW asked patient about assistance while waiting on approval, and patient says she has no one to help her at home. Patient also asked about a motorized wheelchair, but CSW unable to assist with that.   CSW discussed with patient about her cocaine use, and patient indicated that she uses it for pain control. Patient said she had been going to a suboxone clinic in the past but struggled with getting there consistently. CSW asked about pain clinic referral, and patient in agreement. CSW to mention to MD.   CSW also discussed with patient her medications and difficulty with taking them consistently. Patient says sometimes she struggles to afford them, but CSW unable to provide any assistance as patient has Medicaid so copays should be $4. Patient discussed how she's been trying to file for disability but keeps getting denied. CSW unable to assist with disability, encouraged patient to keep trying.  CSW faxed out referral for SNF, will follow for possible bed offers.   Expected Discharge Plan: Skilled Nursing Facility Barriers to Discharge: Continued Medical Work up, Programmer, applications (PASRR), Inadequate or no insurance, Active Substance Use - Placement   Patient Goals and CMS Choice Patient states their goals for this hospitalization and ongoing recovery are:: to get more help at  home CMS Medicare.gov Compare Post Acute Care list provided to:: Patient Choice offered to / list presented to : Patient  Expected Discharge Plan and Services Expected Discharge Plan: [place] Choice: [place] Living arrangements for the past 2 months: Single Family Home                                      Prior Living Arrangements/Services Living arrangements for the past 2 months: Single Family Home Lives with:: Self Patient language and need for interpreter reviewed:: No Do you feel safe going back to the place where you live?: Yes      Need for Family Participation in Patient Care: No (Comment) Care giver support system in place?: No (comment) Current home services: DME Criminal Activity/Legal Involvement Pertinent to Current Situation/Hospitalization: No - Comment as needed  Activities of Daily Living      Permission Sought/Granted Permission sought to share information with : Chartered certified accountant granted to share information with : Yes, Verbal Permission Granted     Permission granted to share info w AGENCY: SNF        Emotional Assessment Appearance:: Appears stated age Attitude/Demeanor/Rapport: Engaged Affect (typically observed): Appropriate Orientation: : Oriented to Self, Oriented to Place, Oriented to  Time, Oriented to Situation Alcohol / Substance Use: Illicit Drugs Psych Involvement: Outpatient Provider  Admission diagnosis:  CVA (cerebral vascular accident) ([place]) [I63.9] Chronic anemia [D64.9] Noncompliance with  medications [Z91.148] Cerebrovascular accident (CVA), unspecified mechanism ([place]) [I63.9] Patient Active Problem List   Diagnosis Date Noted   History of stroke 03/31/2022   Hemiparesis affecting left side as late effect of cerebrovascular accident (CVA) ([place]) 03/31/2022   Opiate use    Microcytic anemia 03/30/2022   Chronic anemia    Noncompliance with  medications    CVA (cerebral vascular accident) ([place]) 09/06/2020   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   History of cervical fracture 12/24/2017   Diabetes mellitus without complication ([place]) 29/52/8413   Hypertension associated with diabetes ([place]) 12/06/2017   Bipolar disorder ([place]) 07/19/2017   Polysubstance abuse ([place]) 07/19/2017   Cocaine use disorder, severe, dependence ([place]) 03/24/2015   MDD (major depressive disorder), recurrent severe, without psychosis ([place]) 03/24/2015   Cannabis abuse 03/23/2013   PCP:  Patient, No Pcp Per Pharmacy:   [facility] [place], [place] [address] Phone: [phone] Fax: [phone]     Social Determinants of Health (SDOH) Interventions    Readmission Risk Interventions     No data to display

## 2022-04-02 NOTE — [place] FL2 (Signed)
[place] MEDICAID FL2 LEVEL OF CARE SCREENING TOOL     IDENTIFICATION  Patient Name: Kaitlyn Gray Birthdate: [birth year] Sex: female Admission Date (Current Location): 03/30/2022  [facility] and [place] Number:  Producer, television/film/video and Address:  [facility] [place], [address] [place], [place] [address]      Provider Number: [id number]  Attending Physician Name and Address:  McDiarmid, Leighton Roach, MD  Relative Name and Phone Number:       Current Level of Care: Hospital Recommended Level of Care: Skilled Nursing Facility Prior Approval Number:    Date Approved/Denied:   PASRR Number: Manual review  Discharge Plan: SNF    Current Diagnoses: Patient Active Problem List   Diagnosis Date Noted   History of stroke 03/31/2022   Hemiparesis affecting left side as late effect of cerebrovascular accident (CVA) (HCC) 03/31/2022   Opiate use    Microcytic anemia 03/30/2022   Chronic anemia    Noncompliance with medications    CVA (cerebral vascular accident) (HCC) 09/06/2020   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   History of cervical fracture 12/24/2017   Diabetes mellitus without complication (HCC) 12/06/2017   Hypertension associated with diabetes (HCC) 12/06/2017   Bipolar disorder (HCC) 07/19/2017   Polysubstance abuse (HCC) 07/19/2017   Cocaine use disorder, severe, dependence (HCC) 03/24/2015   MDD (major depressive disorder), recurrent severe, 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   Cannabis abuse 03/23/2013    Orientation RESPIRATION BLADDER Height & Weight     Self, Time, Situation, Place  Normal Incontinent Weight: 199 lb 11.8 oz (90.6 kg) Height:  5\' 4"  (162.6 cm)  BEHAVIORAL SYMPTOMS/MOOD NEUROLOGICAL BOWEL NUTRITION STATUS      Continent Diet (regular)  AMBULATORY STATUS COMMUNICATION OF NEEDS Skin   Extensive Assist Verbally Normal                       Personal Care Assistance Level of Assistance   Bathing, Feeding, Dressing Bathing Assistance: Maximum assistance Feeding assistance: Limited assistance Dressing Assistance: Maximum assistance     Functional Limitations Info  Speech     Speech Info: Impaired (dysarthria)    SPECIAL CARE FACTORS FREQUENCY  PT (By licensed PT), OT (By licensed OT)     PT Frequency: 5x/wk OT Frequency: 5x/wk            Contractures Contractures Info: Not present    Additional Factors Info  Code Status, Allergies, Psychotropic Code Status Info: Full Allergies Info: Hydrocodone, Latex Psychotropic Info: Seroquel 200mg  daily         Current Medications (04/02/2022):  This is the current hospital active medication list Current Facility-Administered Medications  Medication Dose Route Frequency Provider Last Rate Last Admin   acetaminophen (TYLENOL) tablet 650 mg  650 mg Oral Q4H PRN , DO       Or   acetaminophen (TYLENOL) 160 MG/5ML solution 650 mg  650 mg Per Tube Q4H PRN 04/04/2022, DO       Or   acetaminophen (TYLENOL) suppository 650 mg  650 mg Rectal Q4H PRN Sabino Dick, DO       aspirin EC tablet 81 mg  81 mg Oral Daily Sabino Dick, MD   81 mg at 04/02/22 0908   clopidogrel (PLAVIX) tablet 75 mg  75 mg Oral Daily Vonna Drafts, MD   75 mg at 04/02/22 0908   enoxaparin (LOVENOX) injection 40 mg  40 mg Subcutaneous Q24H Vonna Drafts, DO  40 mg at 04/01/22 2054   ferrous sulfate tablet 325 mg  325 mg Oral Shepard General, Atif, MD   325 mg at 04/01/22 0947   lidocaine (LIDODERM) 5 % 1 patch  1 patch Transdermal Q24H August Albino, MD   1 patch at 04/02/22 0908   metFORMIN (GLUCOPHAGE-XR) 24 hr tablet 1,000 mg  1,000 mg Oral BID Sharion Settler, DO   1,000 mg at 04/02/22 0909   ondansetron (ZOFRAN-ODT) disintegrating tablet 4 mg  4 mg Oral Q8H PRN Leslie Dales, DO       Oral care mouth rinse  15 mL Mouth Rinse PRN McDiarmid, Blane Ohara, MD       polyethylene glycol (MIRALAX / GLYCOLAX)  packet 17 g  17 g Oral Daily August Albino, MD   17 g at 04/02/22 0908   QUEtiapine (SEROQUEL) tablet 200 mg  200 mg Oral QHS Espinoza, Alejandra, DO   200 mg at 04/01/22 2122   rosuvastatin (CRESTOR) tablet 40 mg  40 mg Oral Daily Espinoza, Alejandra, DO   40 mg at 04/02/22 0908   senna-docusate (Senokot-S) tablet 1 tablet  1 tablet Oral QHS August Albino, MD         Discharge Medications: Please see discharge summary for a list of discharge medications.  Relevant Imaging Results:  Relevant Lab Results:   Additional Information SS#: [id number]  Geralynn Ochs, LCSW

## 2022-04-02 NOTE — Progress Notes (Cosign Needed)
Name: Kaitlyn Gray  DOB: June 14, 1976  Please be advised that the above-named patient will require a short-term nursing home stay -- anticipated 30 days or less for rehabilitation and strengthening. The plan is for return home.

## 2022-04-02 NOTE — Progress Notes (Signed)
FMTS Interim Progress Note  S: I was paged by nurse that patient was having worsening lower abdominal pain after Dulcolax suppository.  Patient reports that she feels nauseous and her pain is 9 out of 10.  She denies blood in her stool. I verified with the nurse the patient has had normal bowel movement since suppository, and patient has not vomited tonight.  O: BP (!) 160/80 (BP Location: Left Arm)   Pulse 87   Temp 98.9 F (37.2 C) (Oral)   Resp 19   Ht 5\' 4"  (1.626 m)   Wt 90.6 kg   SpO2 100%   BMI 34.28 kg/m   General: Not in acute distress GI: Abdomen soft, not tender, no rebound tenderness.  Bowel sounds are present.  A/P: Lower abdominal pain Nausea Anticipate pain related to bowel movements s/p dulcolax suppository.  No tenderness when pushing on abdomen, no change in abdomen from admission. - As needed Zofran for nausea - Continue to monitor  If patient's status changes please alert family medicine team.  Leslie Dales, DO 04/02/2022, 9:23 PM PGY-1, [place] Medicine Service pager [phone]

## 2022-04-02 NOTE — Progress Notes (Signed)
Occupational Therapy Treatment Patient Details Name: Kaitlyn Gray MRN: [id number] DOB: 28-Feb-1976 Today's Date: 04/02/2022   History of present illness Pt is a 46 y/o female who presented with RLE weakness. MRI brain showed multiple infarcts of L frontoparietal vertex. PMH:  two prior CVA (2021, 2022), HTN, T2DM, bipolar   OT comments  Patient received in bed and appeared lethargic. Patient stated she felt sleepy but was willing to participate. Patient required verbal cues and mod assist to get to EOB. Patient was setup and min guard assist to perform grooming seated on EOB. LB dressing addressed with donning socks with patient requiring assistance to maintain figure 4 position and gown was changed seated on EOB. Patient performed standing from EOB to perform side stepping toward [facility] and required 2 attempts due to unsteady balance. Patient would benefit from further OT services to increase safety with functional transfers and self care. Acute OT to continue to follow.    Recommendations for follow up therapy are one component of a multi-disciplinary discharge planning process, led by the attending physician.  Recommendations may be updated based on patient status, additional functional criteria and insurance authorization.    Follow Up Recommendations  Skilled nursing-short term rehab (<3 hours/day)    Assistance Recommended at Discharge Frequent or constant Supervision/Assistance  Patient can return home with the following  A lot of help with walking and/or transfers;A lot of help with bathing/dressing/bathroom   Equipment Recommendations  Other (comment) (RW)    Recommendations for Other Services      Precautions / Restrictions Precautions Precautions: Fall;Other (comment) Precaution Comments: high fall risk, R hemiparesis Restrictions Weight Bearing Restrictions: No       Mobility Bed Mobility Overal bed mobility: Needs Assistance Bed Mobility: Supine to Sit, Sit to  Supine     Supine to sit: Mod assist Sit to supine: Mod assist   General bed mobility comments: increased time and verbal cues and assistance to raise trunk    Transfers Overall transfer level: Needs assistance Equipment used: Rollator (4 wheels) Transfers: Sit to/from Stand Sit to Stand: Mod assist           General transfer comment: performed 2 stands from EOB to side step towards [facility] with mod assist     Balance Overall balance assessment: Needs assistance Sitting-balance support: Feet supported, No upper extremity supported Sitting balance-Leahy Scale: Fair     Standing balance support: Bilateral upper extremity supported, Reliant on assistive device for balance Standing balance-Leahy Scale: Poor Standing balance comment: stood from EOB to rollator walker with mod assist and demonstrated "jerky' movement                           ADL either performed or assessed with clinical judgement   ADL Overall ADL's : Needs assistance/impaired     Grooming: Wash/dry hands;Wash/dry face;Oral care;Brushing hair;Min guard;Sitting Grooming Details (indicate cue type and reason): performed seated on EOB due to patient being lethargic and demonstrated unsafe staning         Upper Body Dressing : Minimal assistance;Sitting Upper Body Dressing Details (indicate cue type and reason): changed gown Lower Body Dressing: Moderate assistance;Maximal assistance;Sitting/lateral leans Lower Body Dressing Details (indicate cue type and reason): able to doff socks seated on EOB and donn left sock but unable to donn right. Assistance needed to keep legs in figure 4 position               General ADL Comments:  treated on EOB due to lethargic and poor standing balance    Extremity/Trunk Assessment Upper Extremity Assessment RUE Deficits / Details: 3+/5 overall, slow coordination, ataxic movements at times RUE Coordination: decreased fine motor LUE Deficits / Details: weaker  than R UE from prior CVA, 3/5, ataxic movements at times LUE Coordination: decreased fine motor            Vision       Perception     Praxis      Cognition Arousal/Alertness: Lethargic Behavior During Therapy: Flat affect Overall Cognitive Status: History of cognitive impairments - at baseline                                 General Comments: lethargic stating she was tired but agreeable to treatment        Exercises      Shoulder Instructions       General Comments      Pertinent Vitals/ Pain       Pain Assessment Pain Assessment: No/denies pain Pain Intervention(s): Monitored during session  Home Living                                          Prior Functioning/Environment              Frequency  Min 2X/week        Progress Toward Goals  OT Goals(current goals can now be found in the care plan section)  Progress towards OT goals: Progressing toward goals  Acute Rehab OT Goals Patient Stated Goal: get better OT Goal Formulation: With patient Time For Goal Achievement: 04/14/22 Potential to Achieve Goals: Good ADL Goals Pt Will Perform Grooming: with supervision;standing Pt Will Perform Lower Body Bathing: with supervision;sit to/from stand;sitting/lateral leans Pt Will Perform Lower Body Dressing: with supervision;sitting/lateral leans;sit to/from stand Pt Will Transfer to Toilet: with min guard assist;ambulating  Plan Discharge plan remains appropriate    Co-evaluation                 AM-PAC OT "6 Clicks" Daily Activity     Outcome Measure   Help from another person eating meals?: None Help from another person taking care of personal grooming?: A Little Help from another person toileting, which includes using toliet, bedpan, or urinal?: A Lot Help from another person bathing (including washing, rinsing, drying)?: A Lot Help from another person to put on and taking off regular upper body  clothing?: A Little Help from another person to put on and taking off regular lower body clothing?: A Lot 6 Click Score: 16    End of Session Equipment Utilized During Treatment: Rollator (4 wheels)  OT Visit Diagnosis: Unsteadiness on feet (R26.81);Other abnormalities of gait and mobility (R26.89);Muscle weakness (generalized) (M62.81)   Activity Tolerance Patient limited by lethargy   Patient Left in bed;with call bell/phone within reach;with bed alarm set   Nurse Communication Mobility status        Time: [id number] OT Time Calculation (min): 28 min  Charges: OT General Charges $OT Visit: 1 Visit OT Treatments $Self Care/Home Management : 23-37 mins  Lodema Hong, [place]  Office [place] 04/02/2022, 2:32 PM

## 2022-04-02 NOTE — Plan of Care (Signed)
  Problem: Education: Goal: Knowledge of disease or condition will improve Outcome: Progressing Goal: Knowledge of secondary prevention will improve (SELECT ALL) Outcome: Progressing Goal: Knowledge of patient specific risk factors will improve (INDIVIDUALIZE FOR PATIENT) Outcome: Progressing   Problem: Ischemic Stroke/TIA Tissue Perfusion: Goal: Complications of ischemic stroke/TIA will be minimized Outcome: Progressing   

## 2022-04-02 NOTE — Progress Notes (Signed)
Daily Progress Note Intern Pager: [phone]  Patient name: Kaitlyn Gray Medical record number: [id number] Date of birth: 08-31-1975 Age: 46 y.o. Gender: female  Primary Care Provider: Patient, No Pcp Per Consultants: Neurology (signed off) Code Status: Full  Pt Overview and Major Events to Date:  10/16-admitted  Assessment and Plan:  Kaitlyn Gray is a 46 y.o. female presenting with right leg weakness since 10/14, found to have multiple acute embolic strokes on MRI.  Past medical history includes 2 prior CVAs (2021, 2022), HTN, T2DM, bipolar disorder, cocaine use.  Patient is medically clear for SNF.  * CVA (cerebral vascular accident) [facility]) Neuro exam stable from yesterday.  Had a mechanical fall yesterday due to being OOB without assistance, no trauma.  Neurology signed off, appreciate their input.  Patient is medically stable for SNF.  DC cardiac monitoring and neurochecks, downgrade to medsurg. -Appreciate neurology recs  -DAPT with ASA and Plavix x3 months -> Plavix alone  -outpatient f/u with interventional neuroradiology  -rosuvastatin 40mg , goal LDL <70 - PT, OT eval and treat  -recommend SNF, TOC consulted -Permissive HTN up to 220/120  -Notify MD for BP's > 220/120 -Appreciate TOC involvement for healthcare barriers    Hypertension associated with diabetes ([place]) BP elevated 170-180s/90-130s on admission. Not on any medications given difficult social situation. Given acute CVA, will allow permissive HTN expecting gradual normalization within 5 to 7 days. -Monitor BP -Consider IV labetalol if BP rises above threshold   Diabetes mellitus without complication (HCC) Hgb M3N returned as 6.6, will not initiate insulin at this time. Reports she is supposed to take Metformin and Glipizide. Appears that she may have ran out of all of her medications.  -Continue Metformin -Hold glipizide   Microcytic anemia Low iron, ferritin, and %sat. She is due for  colonoscopy, does not appear to have had one. Father passed from colon cancer.  -oral iron supplementation -F/u with GI outpatient for colonoscopy  Polysubstance abuse ([place]) Avoiding narcotics -tylenol, lidocaine patch for chronic pain  Bipolar disorder (HCC) Continue Seroquel.   Cocaine use disorder, severe, dependence ([place]) Reports last use 2 days prior to admission. -TOC consulted for substance use resources  Cannabis abuse Intermittent use.  -Encourage cessation       FEN/GI: reg diet, sch miralax and senna PPx: Lovenox Dispo: SNF pending placement   Subjective:  Feeling well this morning, about the same as yesterday.  Just a little sleepy this morning.  Denies pain, SOB, N/V, headache.  Objective: Temp:  [97.8 F (36.6 C)-98.5 F (36.9 C)] 98.3 F (36.8 C) (10/18 0730) Pulse Rate:  [70-85] 84 (10/18 0730) Resp:  [16-21] 18 (10/18 0730) BP: (150-187)/(70-141) 160/80 (10/18 0730) SpO2:  [99 %-100 %] 99 % (10/18 0730) Physical Exam: General: NAD, sleepy but awakens and responds appropriately Cardiovascular: RRR, systolic murmur appreciated Respiratory: CTAB normal WOB on RA Abdomen: Soft, NT/ND Neuro: Unable to fully assess but grossly unchanged from prior exam.   Laboratory: Most recent CBC Lab Results  Component Value Date   WBC 4.6 04/01/2022   HGB 7.7 (L) 04/01/2022   HCT 26.6 (L) 04/01/2022   MCV 71.7 (L) 04/01/2022   PLT 299 04/01/2022   Most recent BMP    Latest Ref Rng & Units 04/01/2022    3:45 AM  BMP  Glucose 70 - 99 mg/dL 105   BUN 6 - 20 mg/dL 19   Creatinine 0.44 - 1.00 mg/dL 0.91   Sodium 135 - 145 mmol/L  142   Potassium 3.5 - 5.1 mmol/L 3.5   Chloride 98 - 111 mmol/L 109   CO2 22 - 32 mmol/L 26   Calcium 8.9 - 10.3 mg/dL 8.9     DVT ultrasound negative  August Albino, MD 04/02/2022, 12:04 PM  PGY-1, [place] Intern pager: [phone], text pages welcome Secure chat group [place]

## 2022-04-03 DIAGNOSIS — D649 Anemia, unspecified: Secondary | ICD-10-CM | POA: Diagnosis not present

## 2022-04-03 DIAGNOSIS — I639 Cerebral infarction, unspecified: Secondary | ICD-10-CM | POA: Diagnosis not present

## 2022-04-03 DIAGNOSIS — Z91148 Patient's other noncompliance with medication regimen for other reason: Secondary | ICD-10-CM | POA: Diagnosis not present

## 2022-04-03 DIAGNOSIS — I152 Hypertension secondary to endocrine disorders: Secondary | ICD-10-CM

## 2022-04-03 DIAGNOSIS — E1159 Type 2 diabetes mellitus with other circulatory complications: Secondary | ICD-10-CM | POA: Diagnosis not present

## 2022-04-03 MED ORDER — SENNOSIDES-DOCUSATE SODIUM 8.6-50 MG PO TABS: 1.0000 | ORAL_TABLET | Freq: Every evening | ORAL | Status: DC | PRN

## 2022-04-03 MED ORDER — SENNOSIDES-DOCUSATE SODIUM 8.6-50 MG PO TABS
1.0000 | ORAL_TABLET | Freq: Every day | ORAL | Status: DC
  Administered 2022-04-03: 1 via ORAL
  Filled 2022-04-03: qty 1

## 2022-04-03 MED ORDER — POLYETHYLENE GLYCOL 3350 17 G PO PACK: 17.0000 g | PACK | Freq: Every day | ORAL | Status: DC | PRN

## 2022-04-03 MED ORDER — LOSARTAN POTASSIUM 25 MG PO TABS
25.0000 mg | ORAL_TABLET | Freq: Every day | ORAL | Status: DC
  Administered 2022-04-03 – 2022-04-04 (×2): 25 mg via ORAL
  Filled 2022-04-03 (×2): qty 1

## 2022-04-03 MED ORDER — POLYETHYLENE GLYCOL 3350 17 G PO PACK
17.0000 g | PACK | Freq: Every day | ORAL | Status: DC
  Administered 2022-04-04: 17 g via ORAL
  Filled 2022-04-03: qty 1

## 2022-04-03 NOTE — Progress Notes (Signed)
Daily Progress Note Intern Pager: [phone]  Patient name: Kaitlyn Gray Medical record number: [id number] Date of birth: Sep 12, 1975 Age: 46 y.o. Gender: female  Primary Care Provider: Patient, No Pcp Per Consultants: Neurology (signed off) Code Status: Full  Pt Overview and Major Events to Date:  10/16-admitted  Assessment and Plan:  Kaitlyn Gray is a 46 y.o. female presenting with right leg weakness since 10/14, found to have multiple acute embolic strokes on MRI.  Past medical history includes 2 prior CVAs (2021, 2022), HTN, T2DM, bipolar disorder, cocaine use.  Patient is medically clear for SNF.   * CVA (cerebral vascular accident) [facility]) Neuro exam stable from prior.  Patient is medically stable for SNF.  -Appreciate neurology recs  -DAPT with ASA and Plavix x3 months -> Plavix alone  -outpatient f/u with interventional neuroradiology  -rosuvastatin 40mg , goal LDL <70 - PT, OT eval and treat  -recommended SNF, TOC consulted and awaiting placement -Appreciate TOC involvement for healthcare barriers    Hypertension associated with diabetes ([place]) Not on any medications given difficult social situation. Permissive HTN 5-7 days after CVA, now on day 5.  -Monitor BP, notify >220/120 -Consider starting ARB at discharge -Consider IV labetalol if BP rises above threshold   Diabetes mellitus without complication (HCC) Hgb W5Y returned as 6.6, will not initiate insulin at this time. Reports she is supposed to take Metformin and Glipizide. Appears that she may have ran out of all of her medications.  -Continue Metformin -Hold glipizide   Microcytic anemia Low iron, ferritin, and %sat. She is due for colonoscopy, does not appear to have had one. Father passed from colon cancer.  -oral iron supplementation -F/u with GI outpatient for colonoscopy  Polysubstance abuse ([place]) Avoiding narcotics -tylenol, lidocaine patch for chronic pain  Bipolar disorder  (HCC) Continue Seroquel.   Cocaine use disorder, severe, dependence ([place]) Reports last use 2 days prior to admission. -TOC consulted for substance use resources  Cannabis abuse Intermittent use.  -Encourage cessation         FEN/GI: reg diet, sch miralax and senna PPx: Lovenox Dispo: SNF pending placement   Subjective:  No acute concerns this morning. Pt had some belly pain overnight which resolved w/ simethicone and after having a BM. Pt had an episode of diarrhea this morning which she said improved her pain as well. Otherwise states she is feeling about the same in terms of her weakness.  Objective: Temp:  [98.9 F (37.2 C)] 98.9 F (37.2 C) (10/18 2015) Pulse Rate:  [77-87] 77 (10/19 0438) Resp:  [16-19] 16 (10/19 0438) BP: (160)/(80) 160/80 (10/18 2015) SpO2:  [100 %] 100 % (10/19 0438) Physical Exam: General: NAD, sitting on edge of bed Cardiovascular: RRR, systolic murmur appreciated Respiratory: CTAB normal WOB on RA Abdomen: soft, NT/ND MSK: Moves all extremities, able to lift herself up from bed briefly Neuro: Grossly unchanged from prior exam  Laboratory: Most recent CBC Lab Results  Component Value Date   WBC 4.6 04/01/2022   HGB 7.7 (L) 04/01/2022   HCT 26.6 (L) 04/01/2022   MCV 71.7 (L) 04/01/2022   PLT 299 04/01/2022   Most recent BMP    Latest Ref Rng & Units 04/01/2022    3:45 AM  BMP  Glucose 70 - 99 mg/dL 105   BUN 6 - 20 mg/dL 19   Creatinine 0.44 - 1.00 mg/dL 0.91   Sodium 135 - 145 mmol/L 142   Potassium 3.5 - 5.1 mmol/L 3.5  Chloride 98 - 111 mmol/L 109   CO2 22 - 32 mmol/L 26   Calcium 8.9 - 10.3 mg/dL 8.9      Imaging/Diagnostic Tests: KUB IMPRESSION: Gas-filled loops of colon at the upper limits of normal in caliber. No definite obstruction. Vonna Drafts, MD 04/03/2022, 12:04 PM  PGY-1, [facility] Family Medicine FPTS Intern pager: [phone], text pages welcome Secure chat group [facility]  Teaching Service

## 2022-04-03 NOTE — Progress Notes (Signed)
Physical Therapy Treatment Patient Details Name: Kaitlyn Gray MRN: [id number] DOB: Jul 04, 1975 Today's Date: 04/03/2022   History of Present Illness Pt is a 46 y/o female who presented with RLE weakness. MRI brain showed multiple infarcts of L frontoparietal vertex. PMH:  two prior CVA (2021, 2022), HTN, T2DM, bipolar    PT Comments    Pt asleep upon PT arrival and once awake was very fixated on getting lunch and frustrated that people keep bothering her. Pt with urinary urgency and assisted to [facility] with min A +2, needing facilitation of hips to [facility] to prevent sitting before she was all the way to [facility]. RW then used for sit>stand and pivot steps BSC to recliner. Min A +2 needed with cues for stepping RLE. No knee buckling during stepping today. Pt with good potential to progress ambulation today but pt not agreeable. Positioned in recliner with lunch tray. PT will continue to follow.   Recommendations for follow up therapy are one component of a multi-disciplinary discharge planning process, led by the attending physician.  Recommendations may be updated based on patient status, additional functional criteria and insurance authorization.  Follow Up Recommendations  Skilled nursing-short term rehab (<3 hours/day) Can patient physically be transported by private vehicle: No   Assistance Recommended at Discharge Frequent or constant Supervision/Assistance  Patient can return home with the following A lot of help with walking and/or transfers;A lot of help with bathing/dressing/bathroom;Assistance with cooking/housework;Direct supervision/assist for medications management;Direct supervision/assist for financial management;Assist for transportation;Help with stairs or ramp for entrance   Equipment Recommendations  Rolling walker (2 wheels)    Recommendations for Other Services       Precautions / Restrictions Precautions Precautions: Fall;Other (comment) Precaution Comments: high fall  risk, R hemiparesis Restrictions Weight Bearing Restrictions: No     Mobility  Bed Mobility Overal bed mobility: Needs Assistance Bed Mobility: Supine to Sit     Supine to sit: Mod assist     General bed mobility comments: needed assistance with RLE and HHA for elevation of trunk. Pt hurrying as she had to use toilet    Transfers Overall transfer level: Needs assistance Equipment used: Rolling walker (2 wheels), None Transfers: Sit to/from Stand, Bed to chair/wheelchair/BSC Sit to Stand: Min assist, +2 safety/equipment   Step pivot transfers: Min assist, +2 safety/equipment       General transfer comment: pivoted from bed to [facility] with min A +2, needing vc's and manual facilitation to turn hips fully to [facility]. Pt then stood and took pivot steps from [facility] to recliner with min A +2 with vc's for attending to mvmt of RLE. R knee not buckling but pt needed cues to pick it up and step    Ambulation/Gait               General Gait Details: pivot steps to recliner but pt otherwise Water engineer    Modified Rankin (Stroke Patients Only) Modified Rankin (Stroke Patients Only) Pre-Morbid Rankin Score: Moderate disability Modified Rankin: Moderately severe disability     Balance Overall balance assessment: Needs assistance Sitting-balance support: Feet supported, No upper extremity supported Sitting balance-Leahy Scale: Fair     Standing balance support: Bilateral upper extremity supported, Reliant on assistive device for balance Standing balance-Leahy Scale: Poor Standing balance comment: needs UE support to maintain standing balance  Cognition Arousal/Alertness: Lethargic Behavior During Therapy: Flat affect Overall Cognitive Status: No family/caregiver present to determine baseline cognitive functioning                                 General Comments: pt aggravated  by treatment, stating that everytime she tries to rest or food comes someone interrupts her. Session limited by her disposition today        Exercises      General Comments General comments (skin integrity, edema, etc.): pt assisted in sitting upright to get to lunch tray end of session. Pt did not recall having been in recliner before despite having transferred with PT on other days.      Pertinent Vitals/Pain Pain Assessment Pain Assessment: No/denies pain    Home Living     Available Help at Discharge: Other (Comment) (aunt lives in [place]) Type of Home: Other(Comment) (duplex)                  Prior Function            PT Goals (current goals can now be found in the care plan section) Acute Rehab PT Goals Patient Stated Goal: to get stronger PT Goal Formulation: With patient Time For Goal Achievement: 04/15/22 Potential to Achieve Goals: Good Progress towards PT goals: Not progressing toward goals - comment (irritation)    Frequency    Min 3X/week      PT Plan Current plan remains appropriate    Co-evaluation              AM-PAC PT "6 Clicks" Mobility   Outcome Measure  Help needed turning from your back to your side while in a flat bed without using bedrails?: A Lot Help needed moving from lying on your back to sitting on the side of a flat bed without using bedrails?: A Lot Help needed moving to and from a bed to a chair (including a wheelchair)?: A Lot Help needed standing up from a chair using your arms (e.g., wheelchair or bedside chair)?: A Lot Help needed to walk in hospital room?: Total Help needed climbing 3-5 steps with a railing? : Total 6 Click Score: 10    End of Session Equipment Utilized During Treatment: Gait belt Activity Tolerance: Treatment limited secondary to agitation Patient left: with call bell/phone within reach;in chair;with chair alarm set Nurse Communication: Mobility status PT Visit Diagnosis: Unsteadiness on feet  (R26.81);Other abnormalities of gait and mobility (R26.89);Muscle weakness (generalized) (M62.81);History of falling (Z91.81);Hemiplegia and hemiparesis;Pain Hemiplegia - Right/Left: Right Hemiplegia - dominant/non-dominant: Dominant Hemiplegia - caused by: Cerebral infarction     Time: 1215-1228 PT Time Calculation (min) (ACUTE ONLY): 13 min  Charges:  $Therapeutic Activity: 8-22 mins                     Leighton Roach, PT  Acute Rehab Services Secure chat preferred Office [place] 04/03/2022, 2:27 PM

## 2022-04-03 NOTE — Evaluation (Signed)
Speech Language Pathology Evaluation Patient Details Name: Kaitlyn Gray MRN: [id number] DOB: 10-04-1975 Today's Date: 04/03/2022 Time: 3734-2876 SLP Time Calculation (min) (ACUTE ONLY): 40 min  Problem List:  Patient Active Problem List   Diagnosis Date Noted   History of stroke 03/31/2022   Hemiparesis affecting left side as late effect of cerebrovascular accident (CVA) (HCC) 03/31/2022   Opiate use    Microcytic anemia 03/30/2022   Chronic anemia    Noncompliance with medications    CVA (cerebral vascular accident) (HCC) 09/06/2020   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   History of cervical fracture 12/24/2017   Diabetes mellitus without complication (HCC) 12/06/2017   Hypertension associated with diabetes (HCC) 12/06/2017   Bipolar disorder (HCC) 07/19/2017   Polysubstance abuse (HCC) 07/19/2017   Cocaine use disorder, severe, dependence (HCC) 03/24/2015   MDD (major depressive disorder), recurrent severe, 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   Cannabis abuse 03/23/2013   Past Medical History:  Past Medical History:  Diagnosis Date   Acute encephalopathy 05/12/2018   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   Bipolar 1 disorder (HCC)    Bipolar disorder (HCC) 07/19/2017   Cannabis use disorder, moderate, dependence (HCC) 03/24/2015   Chronic anemia    Cocaine use disorder, severe, dependence (HCC) 03/24/2015   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    Encounter for assessment of healthcare decision-making capacity    History of cervical fracture 12/24/2017   nondisplaced fracture lateral mass of C1 on the right on CT 12/24/17   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) 07/19/2017   Hypertension    Ischemic stroke (HCC) 01/01/2020   subacute right middle cerebellar peduncle and pons infarction   Left-sided weakness 01/27/2022   Head CT with remote right occipital infarct which is consistent with old  left-sided weakness   MDD (major depressive disorder), recurrent severe, 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 female presenting with right leg weakness. MRI shows Cluster of acute infarctions at the left frontoparietal vertex.  Punctate acute infarctions in the right medial parietal lobe.  Findings may represent anterior cerebral artery territory insults.  No large confluent infarction. No hemorrhage or mass effect. Pt has a history of cognitive impairment and oral dysphagia from prior CVA. In 2021 BSE recommended Dys 3 thin, but pt packing food in buccal cavity. Pt passed 3 ounce [place] and was placed on diet.  Cog eval ordered.   Assessment / Plan / Recommendation Clinical Impression  Patient greeted side lying in bed - willing to participate and in evaluation and friendly. She is aware of her CVA and admits to changes with speech, memory and walking ability- however RN reports she frequently tried to get up on her own without calling for help.   Mini-cog administered with pt scoring 4/5 possible points. Testing composed of clock drawing and word recall - she recalled 2/3 words independently - and did not identify the 3rd word correctly despite multiple choice - indicative of storage deficit. RN arrived and pt was distracted by speaking with her regarding her daughter having her same name.  She benefited  from minimal verbal cue to return to tasks.  In addition, pt currently demonstrates mild dysarthria evidenced by imprecise articulation resulting in impaired intelligiblity.  She advises that her speech is not as clear as prior to this event - but did have dysarthria from prior  CVA that she states nearly resolved.  Potential component of motor planning diffiuclty noted also as pt with pauses on occasion with verbalizations.  Thought organization taks of naming words in category was not compromised - pt named 12 words in 60 seconds.  SLP recommends follow up with SLP for cognitive linguistic and dysarthria treatment to maximize her rehab potential.  Treatment included reviewing compensation strategies effective for dysarthria with demonstration, return demonstration as well as reviewing acronymn for CVA signs - "BE FAST" as pt advised she was trying to go to the store despite her symptoms.  Pt is motivated to make progress.  Will follow acutely to decrease caregiver burden.    SLP Assessment  SLP Recommendation/Assessment: Patient needs continued Speech Lanaguage Pathology Services SLP Visit Diagnosis: Cognitive communication deficit (R41.841);Dysarthria and anarthria (R47.1)    Recommendations for follow up therapy are one component of a multi-disciplinary discharge planning process, led by the attending physician.  Recommendations may be updated based on patient status, additional functional criteria and insurance authorization.    Follow Up Recommendations  Home health SLP    Assistance Recommended at Discharge  Intermittent Supervision/Assistance  Functional Status Assessment Patient has had a recent decline in their functional status and demonstrates the ability to make significant improvements in function in a reasonable and predictable amount of time.  Frequency and Duration min 1 x/week  1 week      SLP Evaluation Cognition  Overall Cognitive Status: No family/caregiver present to determine baseline cognitive functioning Arousal/Alertness: Awake/alert Orientation Level: Oriented X4 Year: 2023 Attention: Sustained;Selective Sustained Attention: Impaired Selective Attention: Impaired Selective Attention Impairment: Verbal basic Memory: Impaired Memory  Impairment: Storage deficit;Retrieval deficit (pt recalled 2/3 words independently, did not recall or identify 1/3 words correctly with multiple choice) Awareness: Appears intact (able to verbalize but tries to get up without calling for assist per RN) Safety/Judgment: Impaired (tried to get up independently despite cues)       Comprehension  Auditory Comprehension Overall Auditory Comprehension: Appears within functional limits for tasks assessed Yes/No Questions: Within Functional Limits Commands: Within Functional Limits Conversation: Simple Interfering Components: Attention;Processing speed;Working memory EffectiveTechniques: Repetition;Extra processing time Visual Recognition/Discrimination Discrimination: Within Function Limits Reading Comprehension Reading Status: Within funtional limits (for single words while reviewing "BE FAST" for CVA symptoms)    Expression Expression Primary Mode of Expression: Verbal Verbal Expression Overall Verbal Expression: Appears within functional limits for tasks assessed Initiation: No impairment Repetition: No impairment Naming: Not tested Pragmatics: No impairment Written Expression Dominant Hand: Right Written Expression: Unable to assess (comment) (did not test due to pt's right sided weakness- but had pt direct SLP how to conduct clock drawing)   Oral / Motor  Oral Motor/Sensory Function Overall Oral Motor/Sensory Function: Other (comment) (impaired coordination) Motor Speech Overall Motor Speech: Impaired Respiration: Impaired Level of Impairment: Phrase Phonation: Low vocal intensity Resonance: Within functional limits Articulation: Impaired Level of Impairment: Phrase Intelligibility: Intelligibility reduced Word: 75-100% accurate Phrase: 50-74% accurate Sentence: 50-74% accurate Conversation: Not tested Motor Planning: Impaired Level of Impairment: Word (multisyllabic) Motor Speech Errors: Inconsistent (mild) Interfering  Components:  (premorbid status) Effective Techniques: Slow rate;Over-articulate            Macario Golds 04/03/2022, 11:55  AM Kathleen Lime, MS [facility] SLP Acute Rehab Services Office [phone] Pager [phone]

## 2022-04-03 NOTE — TOC Progression Note (Signed)
Transition of Care [facility]) - Progression Note    Patient Details  Name: Kaitlyn Gray MRN: [id number] Date of Birth: 09-Jun-1976  Transition of Care [facility]) CM/SW [place], [place] Phone Number: 04/03/2022, 11:38 AM  Clinical Narrative:   Patient has no bed offers for SNF. CSW to follow.    Expected Discharge Plan: [place] Barriers to Discharge: Continued Medical Work up, Programmer, applications (PASRR), Inadequate or no insurance, Active Substance Use - Placement  Expected Discharge Plan and Services Expected Discharge Plan: [place] Choice: [place] arrangements for the past 2 months: Single Family Home                                       Social Determinants of Health (SDOH) Interventions    Readmission Risk Interventions     No data to display

## 2022-04-04 DIAGNOSIS — I639 Cerebral infarction, unspecified: Secondary | ICD-10-CM | POA: Diagnosis not present

## 2022-04-04 DIAGNOSIS — F319 Bipolar disorder, unspecified: Secondary | ICD-10-CM | POA: Diagnosis not present

## 2022-04-04 DIAGNOSIS — F1420 Cocaine dependence, uncomplicated: Secondary | ICD-10-CM | POA: Diagnosis not present

## 2022-04-04 DIAGNOSIS — R109 Unspecified abdominal pain: Secondary | ICD-10-CM

## 2022-04-04 MED ORDER — ASPIRIN 81 MG PO TBEC: 81.0000 mg | DELAYED_RELEASE_TABLET | Freq: Every day | ORAL | 12 refills | Status: DC

## 2022-04-04 MED ORDER — SIMETHICONE 80 MG PO CHEW: 80.0000 mg | CHEWABLE_TABLET | Freq: Four times a day (QID) | ORAL | Status: DC | PRN

## 2022-04-04 MED ORDER — FERROUS SULFATE 325 (65 FE) MG PO TABS: 325.0000 mg | ORAL_TABLET | ORAL | 0 refills | Status: DC

## 2022-04-04 MED ORDER — SIMETHICONE 80 MG PO CHEW
80.0000 mg | CHEWABLE_TABLET | Freq: Once | ORAL | Status: AC
  Administered 2022-04-04: 80 mg via ORAL
  Filled 2022-04-04: qty 1

## 2022-04-04 MED ORDER — ROSUVASTATIN CALCIUM 40 MG PO TABS: 40.0000 mg | ORAL_TABLET | Freq: Every day | ORAL | 0 refills | Status: DC

## 2022-04-04 MED ORDER — METFORMIN HCL ER 500 MG PO TB24
500.0000 mg | ORAL_TABLET | Freq: Two times a day (BID) | ORAL | Status: DC
  Filled 2022-04-04: qty 1

## 2022-04-04 MED ORDER — METFORMIN HCL ER 500 MG PO TB24: 500.0000 mg | ORAL_TABLET | Freq: Two times a day (BID) | ORAL | 0 refills | Status: DC

## 2022-04-04 MED ORDER — LOSARTAN POTASSIUM 25 MG PO TABS: 25.0000 mg | ORAL_TABLET | Freq: Every day | ORAL | 0 refills | Status: DC

## 2022-04-04 MED ORDER — CLOPIDOGREL BISULFATE 75 MG PO TABS: 75.0000 mg | ORAL_TABLET | Freq: Every day | ORAL | 0 refills | Status: DC

## 2022-04-04 NOTE — TOC Progression Note (Signed)
Transition of Care [facility]) - Progression Note    Patient Details  Name: Kaitlyn Gray MRN: [id number] Date of Birth: 1976/06/01  Transition of Care [facility]) CM/SW [place], [place] Phone Number: 04/04/2022, 12:40 PM  Clinical Narrative:   Patient continues to have no bed offers for SNF. CSW discussed with MD how SNF placement is highly unlikely and barriers to SNF placement. CSW to follow.    Expected Discharge Plan: [place] Barriers to Discharge: Continued Medical Work up, Programmer, applications (PASRR), Inadequate or no insurance, Active Substance Use - Placement  Expected Discharge Plan and Services Expected Discharge Plan: [place] Choice: [place] arrangements for the past 2 months: Single Family Home                                       Social Determinants of Health (SDOH) Interventions    Readmission Risk Interventions     No data to display

## 2022-04-04 NOTE — TOC Transition Note (Signed)
Transition of Care [facility]) - CM/SW Discharge Note   Patient Details  Name: Kaitlyn Gray MRN: [id number] Date of Birth: Nov 18, 1975  Transition of Care [facility]) CM/SW Contact:  Pollie Friar, RN Phone Number: 04/04/2022, 1:24 PM   Clinical Narrative:    Pt left hospital AMA.    Final next level of care: Against Medical Advice Barriers to Discharge: Continued Medical Work up, Programmer, applications (PASRR), Inadequate or no insurance, Active Substance Use - Placement   Patient Goals and CMS Choice Patient states their goals for this hospitalization and ongoing recovery are:: to get more help at home CMS Medicare.gov Compare Post Acute Care list provided to:: Patient Choice offered to / list presented to : Patient  Discharge Placement                       Discharge Plan and Services     Post Acute Care Choice: Skilled Nursing Facility                               Social Determinants of Health (SDOH) Interventions     Readmission Risk Interventions     No data to display

## 2022-04-04 NOTE — Progress Notes (Signed)
Daily Progress Note Intern Pager: [phone]  Patient name: Kaitlyn Gray Medical record number: [id number] Date of birth: Feb 22, 1976 Age: 46 y.o. Gender: female  Primary Care Provider: Patient, No Pcp Per Consultants: Neurology (signed off) Code Status: Full  Pt Overview and Major Events to Date:  10/16-admitted  Assessment and Plan:  Kaitlyn Gray is a 46 y.o. female presenting with right leg weakness since 10/14, found to have multiple acute embolic strokes on MRI.  Past medical history includes 2 prior CVAs (2021, 2022), HTN, T2DM, bipolar disorder, cocaine use.  Patient is medically clear for SNF.    * CVA (cerebral vascular accident) [facility]) Neuro exam stable from prior.  Patient is medically stable for SNF.  -Appreciate neurology recs  -DAPT with ASA and Plavix x3 months -> Plavix alone  -outpatient f/u with interventional neuroradiology  -rosuvastatin 40mg , goal LDL <70 - PT, OT eval and treat  -recommended SNF, TOC consulted and awaiting placement -Appreciate TOC involvement for healthcare barriers    Abdominal pain Patient with intermittent abdominal pain, typically at night.  Resolves with BM and/or simethicone.  Suspect constipation/gas related pain.  Abdominal exam remains benign. -Continue scheduled MiraLAX and senna -Simethicone if needed -As needed Zofran -Consider CT abdomen if acutely worsening  Hypertension associated with diabetes (HCC) Previously not on any medications.  Started losartan 10/19. Pt requests micardis which we do not have in formulary. -Continue losartan 25 mg daily  Diabetes mellitus without complication (HCC) Hgb A1c returned as 6.6, will not initiate insulin at this time. Reports she is supposed to take Metformin and Glipizide. Appears that she may have ran out of all of her medications.  -Continue Metformin 500 BID -Hold glipizide   Microcytic anemia Low iron, ferritin, and %sat. She is due for colonoscopy, does not appear  to have had one. Father passed from colon cancer.  -oral iron supplementation -F/u with GI outpatient for colonoscopy  Polysubstance abuse (HCC) Avoiding narcotics -tylenol, lidocaine patch for chronic pain  Bipolar disorder (HCC) Continue Seroquel.   Cocaine use disorder, severe, dependence (HCC) Reports last use 2 days prior to admission. -TOC consulted for substance use resources  Cannabis abuse Intermittent use.  -Encourage cessation       FEN/GI: reg diet, sch miralax and senna PPx: Lovenox Dispo: SNF pending placement    Subjective:  Seen this morning, pt reports feeling okay this morning. Continues to have some abdominal discomfort and gassy pain. Reports that she took metformin 500mg  BID at home, not 1000mg . Also asking to take micardis instead of losartan because this worked well for her once. Understands plan for SNF. Pt received a phone call during our interview and she asked me to leave the room.  Objective: Temp:  [97.8 F (36.6 C)-98.7 F (37.1 C)] 97.8 F (36.6 C) (10/20 0843) Pulse Rate:  [77-83] 77 (10/20 0843) Resp:  [15-18] 18 (10/20 0843) BP: (138-160)/(70-90) 138/78 (10/20 0843) SpO2:  [99 %-100 %] 99 % (10/20 0843) Physical Exam: General: NAD,  sitting on edge of bed Cardiovascular: RRR systolic murmur appreciated Respiratory: CTAB normal WOB on RA Abdomen: soft, ND, mild discomfort to palpation of LUQ and LLQ Extremities: 5/5 strength RUE, 4/5 LUE  Laboratory: Most recent CBC Lab Results  Component Value Date   WBC 4.6 04/01/2022   HGB 7.7 (L) 04/01/2022   HCT 26.6 (L) 04/01/2022   MCV 71.7 (L) 04/01/2022   PLT 299 04/01/2022   Most recent BMP    Latest Ref Rng &  Units 04/01/2022    3:45 AM  BMP  Glucose 70 - 99 mg/dL 105   BUN 6 - 20 mg/dL 19   Creatinine 0.44 - 1.00 mg/dL 0.91   Sodium 135 - 145 mmol/L 142   Potassium 3.5 - 5.1 mmol/L 3.5   Chloride 98 - 111 mmol/L 109   CO2 22 - 32 mmol/L 26   Calcium 8.9 - 10.3 mg/dL 8.9       August Albino, MD 04/04/2022, 11:05 AM  PGY-1, [place] Intern pager: [phone], text pages welcome Secure chat group [place]

## 2022-04-04 NOTE — Progress Notes (Signed)
August Albino, MD came to bedside and had a talk with the patient and she still insisted to leave against medical advice.  AMA paper signed by patient.  Pt. A&Ox4.

## 2022-04-04 NOTE — Discharge Summary (Addendum)
[facility] AMA Summary  Patient name: Kaitlyn Gray Medical record number: [id number] Date of birth: April 12, 1976 Age: 46 y.o. Gender: female Date of Admission: 03/30/2022  Date of AMA: 04/04/22 Admitting Physician: August Albino, MD  Primary Care Provider: Patient, No Pcp Per Consultants: Neuro  Indication for Hospitalization: CVA  Diagnoses/Problem List:  Principal Problem for Admission: CVA Other Problems addressed during stay:  Principal Problem:   CVA (cerebral vascular accident) ([place]) Active Problems:   Diabetes mellitus without complication ([place])   Hypertension associated with diabetes ([place])   Abdominal pain   Cannabis abuse   Cocaine use disorder, severe, dependence ([place])   Bipolar disorder ([place])   Polysubstance abuse ([place])   Microcytic anemia   Chronic anemia   History of stroke   Hemiparesis affecting left side as late effect of cerebrovascular accident (CVA) [facility])    Brief Hospital Course:  Kaitlyn Gray is a 46 y.o.female with a history of 2 prior CVAs (2021, 2022), HTN, T2DM, bipolar disorder, cocaine use who was admitted to the family medicine teaching Service at [facility] for stroke. Her hospital course is detailed below:  Of note, on the morning after her admission, the patient was disruptive to staff and was initially removed from the ED.  However, she did not sign AMA papers and stated that she wished to stay and receive care.  She was then readmitted and was not disruptive for the majority of her stay. On 10/20, the patient left AMA from the hospital.  CVA, likely embolic Presented with right-sided weakness as a code stroke, outside the window for thrombolytics.  Also has chronic left-sided weakness from prior stroke.  MRI with multiple acute infarcts.  CTA head and neck with stenosis of multiple branches as well as bilateral ICA.  Full imaging reports below.  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  Patient received PT/OT, recommended SNF placement after discharge.However, patient left AMA prior to bed placement. Sent Plavix and Crestor to pharmacy.  HTN Started losartan 6m daily after initial period of permissive hypertension.  T2DM A1c 6.6.  Continued on home metformin.  Held glipizide.  Patient reports that she ran out of her medications.  Microcytic anemia Chronic, stable.  Iron studies checked, noted to have low iron, ferritin, percent saturation.  Started oral iron supplementation during this stay.  Recommend that she receive a screening colonoscopy outpatient.  Polysubstance abuse Patient reports cocaine use 2 days prior to admission.  States that she mainly uses for pain control.  Avoided narcotics during her stay.  Other chronic conditions  Bipolar disorder: continued seroquel   PCP Follow-up Recommendations:  Patient left AMA so may not receive medications. Sent meds to home pharmacy  Follow-up with outpatient interventional neuroradiology for progressive Right ICA stenosis (currently asymptomatic) DAPT x3 months (until 07/03/2021) followed by clopidogrel monotherapy Started on oral iron supplementation.  Patient is due for colonoscopy Patient may benefit from outpatient pain clinic Please f/u hypertension and diabetes regimen - started on losartan and continued metformin    PERTINENT IMAGING:  CT HEAD WO CONTRAST IMPRESSION: 1. No acute intracranial abnormalities. 2. Remote right occipital infarct, unchanged.   MR BRAIN WO CONTRAST IMPRESSION: 1. Cluster of acute infarctions at the left frontoparietal vertex. Punctate acute infarctions in the right medial parietal lobe. Findings may represent anterior cerebral artery territory insults. No large confluent infarction. No hemorrhage or mass effect. 2. Old infarctions in the  pons, right cerebellar peduncle and right occipital cortical and  subcortical brain. Chronic small-vessel ischemic changes of the hemispheric white matter.   CT ANGIO HEAD NECK IMPRESSION: Moderate stenosis of the right PCA distal P1 segment, severe stenosis of the proximal P2 segment and occlusion of proximal P3 segment.   ADDENDUM: Moderate to severe stenosis in the distal A2 or proximal A3 (series 6, image 255). The distal ACAs are somewhat irregular.   As described in the body of the report, there is approximately 70% stenosis in the mid right extracranial ICA.   KUB IMPRESSION: Gas-filled loops of colon at the upper limits of normal in caliber. No definite obstruction.   Disposition: home (left AMA)  Exam:  Vitals:   04/04/22 0843 04/04/22 1143  BP: 138/78 (!) 150/66  Pulse: 77 88  Resp: 18 18  Temp: 97.8 F (36.6 C) 98.3 F (36.8 C)  SpO2: 99% 99%   Gen: A&Ox4, alert CV: RRR, systolic murmur appreciated Pulm: CTAB on RA Abd: Soft, ND, mild discomfort to palpation of LUQ and LLQ Ext: 5/5 strength RUE, 4/5 LUE    Significant Labs and Imaging:  BMET    Component Value Date/Time   NA 142 04/01/2022 0345   K 3.5 04/01/2022 0345   CL 109 04/01/2022 0345   CO2 26 04/01/2022 0345   GLUCOSE 105 (H) 04/01/2022 0345   BUN 19 04/01/2022 0345   CREATININE 0.91 04/01/2022 0345   CALCIUM 8.9 04/01/2022 0345   GFRNONAA >60 04/01/2022 0345   CBC    Component Value Date/Time   WBC 4.6 04/01/2022 0345   RBC 3.71 (L) 04/01/2022 0345   HGB 7.7 (L) 04/01/2022 0345   HCT 26.6 (L) 04/01/2022 0345   PLT 299 04/01/2022 0345   MCV 71.7 (L) 04/01/2022 0345   MCH 20.8 (L) 04/01/2022 0345   MCHC 28.9 (L) 04/01/2022 0345   RDW 18.6 (H) 04/01/2022 0345   LYMPHSABS 1.7 02/28/2022 1013   MONOABS 0.4 02/28/2022 1013   EOSABS 0.1 02/28/2022 1013   BASOSABS 0.0 02/28/2022 1013        Medications:  Allergies as of 04/04/2022       Reactions   Hydrocodone Itching   Latex Rash        Medication List     STOP taking these  medications    amLODipine-benazepril 5-10 MG capsule Commonly known as: LOTREL   aspirin 81 MG chewable tablet Replaced by: aspirin EC 81 MG tablet   lisinopril 10 MG tablet Commonly known as: ZESTRIL       TAKE these medications    aspirin EC 81 MG tablet Take 1 tablet (81 mg total) by mouth daily. Swallow whole. Start taking on: April 05, 2022 Replaces: aspirin 81 MG chewable tablet   blood glucose meter kit and supplies Relion Prime or Dispense other brand based on patient and insurance preference. Use up to four times daily as directed. (FOR ICD-9 250.00, 250.01).   blood glucose meter kit and supplies Kit Dispense based on patient and insurance preference. Use up to four times daily as directed. (FOR ICD-9 250.00, 250.01). Please give 3 months supply of lancets and test strips to allow her to check QID.   clopidogrel 75 MG tablet Commonly known as: PLAVIX Take 1 tablet (75 mg total) by mouth daily. Start taking on: April 05, 2022   ferrous sulfate 325 (65 FE) MG tablet Take 1 tablet (325 mg total) by mouth every other day. Start taking on: April 05, 2022  What changed: when to take this   gabapentin 400 MG capsule Commonly known as: NEURONTIN Take 1 capsule (400 mg total) by mouth 2 (two) times daily.   glipiZIDE 10 MG 24 hr tablet Commonly known as: GLUCOTROL XL Take 10 mg by mouth daily.   Insulin Pen Needle 31G X 5 MM Misc Use as directed   losartan 25 MG tablet Commonly known as: COZAAR Take 1 tablet (25 mg total) by mouth daily. Start taking on: April 05, 2022   metFORMIN 500 MG 24 hr tablet Commonly known as: GLUCOPHAGE-XR Take 1 tablet (500 mg total) by mouth 2 (two) times daily.   QUEtiapine 200 MG tablet Commonly known as: SEROQUEL Take 200 mg by mouth at bedtime.   rosuvastatin 40 MG tablet Commonly known as: CRESTOR Take 1 tablet (40 mg total) by mouth daily. Start taking on: April 05, 2022         Follow-Up  Appointments:  Follow-up Information     [facility]. Schedule an appointment as soon as possible for a visit in 1 month(s).   Specialty: Neurology Why: stroke clinic Contact information: [address] [place] V[place], [place], MD 04/04/2022, 1:09 PM PGY-1, [place]  I was personally present and performed or re-performed the history, physical exam and medical decision making activities of this service and have verified that the service and findings are accurately documented in the resident's note.  Zola Button, MD                  04/04/2022, 1:58 PM

## 2022-04-04 NOTE — Progress Notes (Signed)
FMTS Interim Progress Note  Up to see patient after paged by RN for patient wanting to leave AMA.  Patient reports that she feels better, is now able to ambulate by herself using her walker, does not wish to stay in the hospital any longer and "lay in bed all day".  Patient is not very happy with her stay.  She expresses understanding of the risks associated with her leaving AMA, including potential for falls, bleeding/trauma, and worsening of her neurologic status.  She no longer wishes to participate in rehab.  States she will be okay at home.  Patient is willing to sign AMA paperwork.  August Albino, MD 04/04/2022, 12:51 PM PGY-1, [place] [facility] Service pager [phone]

## 2022-04-04 NOTE — Assessment & Plan Note (Signed)
Patient with intermittent abdominal pain, typically at night.  Resolves with BM and/or simethicone.  Suspect constipation/gas related pain.  Abdominal exam remains benign. -Continue scheduled MiraLAX and senna -Simethicone if needed -As needed Zofran -Consider CT abdomen if acutely worsening

## 2022-04-04 NOTE — Progress Notes (Signed)
FMTS Interim Progress Note  S: I went to evaluate the patient at bedside after nurse informed me that patient is still having abdominal pain.  Patient reports that she was able to eat dinner and her pain is only occurring at night.  She endorses increased flatulence. She denies nausea or vomiting. Nurse reports last bowel movement was 11 PM yesterday.  O: BP (!) 160/80 (BP Location: Left Arm)   Pulse 82   Temp 97.8 F (36.6 C) (Oral)   Resp 16   Ht 5\' 4"  (1.626 m)   Wt 90.6 kg   SpO2 99%   BMI 34.28 kg/m   General: Sleepy but arousable, not in acute distress Respiratory: Normal work of breathing on room air GI: Soft, no rebound tenderness, mild tenderness to palpation diffusely. Not tympanic. Neuro: Oriented to person place and time.  Sleepy s/p Seroquel.  A/P: Abdominal pain Abdomen soft, mild tenderness to palpation- however patient is yawning and falling asleep while pressing abdomen.  She is oriented, sleepiness 2/2 Seroquel-nursing staff reports this is her, baseline after taking Seroquel.  She is eating and drinking. Simethicone helped last night. - Simethicone - Continue to monitor - Please contact for medicine team if patient's symptoms worsen  Leslie Dales, DO 04/04/2022, 1:43 AM PGY-1, [place] [facility] Service pager [phone]

## 2022-04-06 ENCOUNTER — Emergency Department (HOSPITAL_COMMUNITY): Payer: Medicaid Other

## 2022-04-06 ENCOUNTER — Encounter (HOSPITAL_COMMUNITY): Payer: Self-pay | Admitting: Internal Medicine

## 2022-04-06 ENCOUNTER — Other Ambulatory Visit: Payer: Self-pay

## 2022-04-06 ENCOUNTER — Inpatient Hospital Stay (HOSPITAL_COMMUNITY)
Admission: EM | Admit: 2022-04-06 | Discharge: 2022-04-11 | DRG: 065 | Discharge status: Against Medical Advice | Payer: Medicaid Other | Attending: Student | Admitting: Student

## 2022-04-06 DIAGNOSIS — Z7984 Long term (current) use of oral hypoglycemic drugs: Secondary | ICD-10-CM

## 2022-04-06 DIAGNOSIS — R262 Difficulty in walking, not elsewhere classified: Secondary | ICD-10-CM

## 2022-04-06 DIAGNOSIS — I639 Cerebral infarction, unspecified: Secondary | ICD-10-CM | POA: Diagnosis not present

## 2022-04-06 DIAGNOSIS — E1169 Type 2 diabetes mellitus with other specified complication: Secondary | ICD-10-CM | POA: Diagnosis present

## 2022-04-06 DIAGNOSIS — Z7982 Long term (current) use of aspirin: Secondary | ICD-10-CM

## 2022-04-06 DIAGNOSIS — I152 Hypertension secondary to endocrine disorders: Secondary | ICD-10-CM | POA: Diagnosis present

## 2022-04-06 DIAGNOSIS — F319 Bipolar disorder, unspecified: Secondary | ICD-10-CM | POA: Diagnosis present

## 2022-04-06 DIAGNOSIS — W19XXXA Unspecified fall, initial encounter: Secondary | ICD-10-CM | POA: Diagnosis present

## 2022-04-06 DIAGNOSIS — E669 Obesity, unspecified: Secondary | ICD-10-CM | POA: Diagnosis not present

## 2022-04-06 DIAGNOSIS — E66811 Obesity, class 1: Secondary | ICD-10-CM | POA: Diagnosis present

## 2022-04-06 DIAGNOSIS — I69354 Hemiplegia and hemiparesis following cerebral infarction affecting left non-dominant side: Secondary | ICD-10-CM

## 2022-04-06 DIAGNOSIS — I69322 Dysarthria following cerebral infarction: Secondary | ICD-10-CM

## 2022-04-06 DIAGNOSIS — F17210 Nicotine dependence, cigarettes, uncomplicated: Secondary | ICD-10-CM | POA: Diagnosis present

## 2022-04-06 DIAGNOSIS — Z79899 Other long term (current) drug therapy: Secondary | ICD-10-CM

## 2022-04-06 DIAGNOSIS — F332 Major depressive disorder, recurrent severe without psychotic features: Secondary | ICD-10-CM | POA: Diagnosis present

## 2022-04-06 DIAGNOSIS — F1910 Other psychoactive substance abuse, uncomplicated: Secondary | ICD-10-CM | POA: Diagnosis present

## 2022-04-06 DIAGNOSIS — G8929 Other chronic pain: Secondary | ICD-10-CM | POA: Diagnosis present

## 2022-04-06 DIAGNOSIS — Z5329 Procedure and treatment not carried out because of patient's decision for other reasons: Secondary | ICD-10-CM | POA: Diagnosis present

## 2022-04-06 DIAGNOSIS — E1159 Type 2 diabetes mellitus with other circulatory complications: Secondary | ICD-10-CM | POA: Diagnosis present

## 2022-04-06 DIAGNOSIS — Z91040 Latex allergy status: Secondary | ICD-10-CM

## 2022-04-06 DIAGNOSIS — Z6834 Body mass index (BMI) 34.0-34.9, adult: Secondary | ICD-10-CM

## 2022-04-06 DIAGNOSIS — K5900 Constipation, unspecified: Secondary | ICD-10-CM | POA: Diagnosis present

## 2022-04-06 DIAGNOSIS — E119 Type 2 diabetes mellitus without complications: Secondary | ICD-10-CM

## 2022-04-06 DIAGNOSIS — Z885 Allergy status to narcotic agent status: Secondary | ICD-10-CM

## 2022-04-06 DIAGNOSIS — F1420 Cocaine dependence, uncomplicated: Secondary | ICD-10-CM | POA: Diagnosis present

## 2022-04-06 DIAGNOSIS — Y92009 Unspecified place in unspecified non-institutional (private) residence as the place of occurrence of the external cause: Secondary | ICD-10-CM

## 2022-04-06 DIAGNOSIS — F32A Depression, unspecified: Secondary | ICD-10-CM | POA: Diagnosis present

## 2022-04-06 DIAGNOSIS — Z7902 Long term (current) use of antithrombotics/antiplatelets: Secondary | ICD-10-CM

## 2022-04-06 DIAGNOSIS — Z794 Long term (current) use of insulin: Secondary | ICD-10-CM

## 2022-04-06 DIAGNOSIS — F4324 Adjustment disorder with disturbance of conduct: Secondary | ICD-10-CM | POA: Diagnosis present

## 2022-04-06 DIAGNOSIS — E11649 Type 2 diabetes mellitus with hypoglycemia without coma: Secondary | ICD-10-CM

## 2022-04-06 DIAGNOSIS — F250 Schizoaffective disorder, bipolar type: Secondary | ICD-10-CM | POA: Diagnosis present

## 2022-04-06 DIAGNOSIS — I63523 Cerebral infarction due to unspecified occlusion or stenosis of bilateral anterior cerebral arteries: Principal | ICD-10-CM | POA: Diagnosis present

## 2022-04-06 DIAGNOSIS — I771 Stricture of artery: Secondary | ICD-10-CM | POA: Diagnosis present

## 2022-04-06 DIAGNOSIS — I6521 Occlusion and stenosis of right carotid artery: Secondary | ICD-10-CM

## 2022-04-06 DIAGNOSIS — Z720 Tobacco use: Secondary | ICD-10-CM

## 2022-04-06 DIAGNOSIS — Z91148 Patient's other noncompliance with medication regimen for other reason: Secondary | ICD-10-CM

## 2022-04-06 DIAGNOSIS — I10 Essential (primary) hypertension: Secondary | ICD-10-CM | POA: Diagnosis present

## 2022-04-06 DIAGNOSIS — Z8249 Family history of ischemic heart disease and other diseases of the circulatory system: Secondary | ICD-10-CM

## 2022-04-06 DIAGNOSIS — D509 Iron deficiency anemia, unspecified: Secondary | ICD-10-CM | POA: Diagnosis present

## 2022-04-06 DIAGNOSIS — R531 Weakness: Secondary | ICD-10-CM | POA: Diagnosis present

## 2022-04-06 DIAGNOSIS — Z91119 Patient's noncompliance with dietary regimen due to unspecified reason: Secondary | ICD-10-CM

## 2022-04-06 LAB — RAPID URINE DRUG SCREEN, HOSP PERFORMED
Amphetamines: NOT DETECTED
Barbiturates: NOT DETECTED
Benzodiazepines: NOT DETECTED
Cocaine: POSITIVE — AB
Opiates: NOT DETECTED
Tetrahydrocannabinol: NOT DETECTED

## 2022-04-06 LAB — CBC WITH DIFFERENTIAL/PLATELET
Abs Immature Granulocytes: 0.01 10*3/uL (ref 0.00–0.07)
Basophils Absolute: 0 10*3/uL (ref 0.0–0.1)
Basophils Relative: 0 %
Eosinophils Absolute: 0.3 10*3/uL (ref 0.0–0.5)
Eosinophils Relative: 4 %
HCT: 26.2 % — ABNORMAL LOW (ref 36.0–46.0)
Hemoglobin: 7.4 g/dL — ABNORMAL LOW (ref 12.0–15.0)
Immature Granulocytes: 0 %
Lymphocytes Relative: 26 %
Lymphs Abs: 1.5 10*3/uL (ref 0.7–4.0)
MCH: 20.7 pg — ABNORMAL LOW (ref 26.0–34.0)
MCHC: 28.2 g/dL — ABNORMAL LOW (ref 30.0–36.0)
MCV: 73.2 fL — ABNORMAL LOW (ref 80.0–100.0)
Monocytes Absolute: 0.5 10*3/uL (ref 0.1–1.0)
Monocytes Relative: 9 %
Neutro Abs: 3.6 10*3/uL (ref 1.7–7.7)
Neutrophils Relative %: 61 %
Platelets: 271 10*3/uL (ref 150–400)
RBC: 3.58 MIL/uL — ABNORMAL LOW (ref 3.87–5.11)
RDW: 19.2 % — ABNORMAL HIGH (ref 11.5–15.5)
WBC: 5.9 10*3/uL (ref 4.0–10.5)
nRBC: 0 % (ref 0.0–0.2)

## 2022-04-06 LAB — COMPREHENSIVE METABOLIC PANEL
ALT: 34 U/L (ref 0–44)
AST: 30 U/L (ref 15–41)
Albumin: 3.5 g/dL (ref 3.5–5.0)
Alkaline Phosphatase: 64 U/L (ref 38–126)
Anion gap: 5 (ref 5–15)
BUN: 19 mg/dL (ref 6–20)
CO2: 22 mmol/L (ref 22–32)
Calcium: 8.5 mg/dL — ABNORMAL LOW (ref 8.9–10.3)
Chloride: 113 mmol/L — ABNORMAL HIGH (ref 98–111)
Creatinine, Ser: 0.72 mg/dL (ref 0.44–1.00)
GFR, Estimated: >60 mL/min (ref >60)
Glucose, Bld: 121 mg/dL — ABNORMAL HIGH (ref 70–99)
Potassium: 3.9 mmol/L (ref 3.5–5.1)
Sodium: 140 mmol/L (ref 135–145)
Total Bilirubin: 0.3 mg/dL (ref 0.3–1.2)
Total Protein: 7.1 g/dL (ref 6.5–8.1)

## 2022-04-06 LAB — GLUCOSE, CAPILLARY: Glucose-Capillary: 116 mg/dL — ABNORMAL HIGH (ref 70–99)

## 2022-04-06 LAB — ETHANOL: Alcohol, Ethyl (B): <10 mg/dL (ref <10)

## 2022-04-06 LAB — CBG MONITORING, ED
Glucose-Capillary: 141 mg/dL — ABNORMAL HIGH (ref 70–99)
Glucose-Capillary: 113 mg/dL — ABNORMAL HIGH (ref 70–99)

## 2022-04-06 MED ORDER — AMLODIPINE BESYLATE 5 MG PO TABS
2.5000 mg | ORAL_TABLET | Freq: Every day | ORAL | Status: DC
  Administered 2022-04-06 – 2022-04-08 (×3): 2.5 mg via ORAL
  Filled 2022-04-06 (×3): qty 1

## 2022-04-06 MED ORDER — NICOTINE 14 MG/24HR TD PT24: 14.0000 mg | MEDICATED_PATCH | Freq: Every day | TRANSDERMAL | Status: DC | PRN

## 2022-04-06 MED ORDER — ASPIRIN 81 MG PO TBEC
81.0000 mg | DELAYED_RELEASE_TABLET | Freq: Every day | ORAL | Status: DC
  Administered 2022-04-06 – 2022-04-11 (×6): 81 mg via ORAL
  Filled 2022-04-06 (×6): qty 1

## 2022-04-06 MED ORDER — QUETIAPINE FUMARATE 200 MG PO TABS
200.0000 mg | ORAL_TABLET | Freq: Every day | ORAL | Status: DC
  Administered 2022-04-06 – 2022-04-10 (×5): 200 mg via ORAL
  Filled 2022-04-06 (×5): qty 1

## 2022-04-06 MED ORDER — INSULIN ASPART 100 UNIT/ML IJ SOLN
0.0000 [IU] | Freq: Three times a day (TID) | INTRAMUSCULAR | Status: DC
  Administered 2022-04-06: 2 [IU] via SUBCUTANEOUS
  Administered 2022-04-08: 3 [IU] via SUBCUTANEOUS
  Administered 2022-04-09: 8 [IU] via SUBCUTANEOUS
  Administered 2022-04-09: 2 [IU] via SUBCUTANEOUS
  Administered 2022-04-10: 15 [IU] via SUBCUTANEOUS
  Administered 2022-04-10: 2 [IU] via SUBCUTANEOUS
  Filled 2022-04-06: qty 0.15

## 2022-04-06 MED ORDER — ORAL CARE MOUTH RINSE: 15.0000 mL | OROMUCOSAL | Status: DC | PRN

## 2022-04-06 MED ORDER — FERROUS SULFATE 325 (65 FE) MG PO TABS
325.0000 mg | ORAL_TABLET | ORAL | Status: DC
  Administered 2022-04-06 – 2022-04-10 (×3): 325 mg via ORAL
  Filled 2022-04-06 (×4): qty 1

## 2022-04-06 MED ORDER — CLOPIDOGREL BISULFATE 75 MG PO TABS
75.0000 mg | ORAL_TABLET | Freq: Every day | ORAL | Status: DC
  Administered 2022-04-06 – 2022-04-11 (×6): 75 mg via ORAL
  Filled 2022-04-06 (×7): qty 1

## 2022-04-06 MED ORDER — INFLUENZA VAC SPLIT QUAD 0.5 ML IM SUSY: 0.5000 mL | PREFILLED_SYRINGE | INTRAMUSCULAR | Status: DC

## 2022-04-06 MED ORDER — ROSUVASTATIN CALCIUM 20 MG PO TABS
40.0000 mg | ORAL_TABLET | Freq: Every day | ORAL | Status: DC
  Administered 2022-04-06 – 2022-04-11 (×6): 40 mg via ORAL
  Filled 2022-04-06 (×6): qty 2

## 2022-04-06 MED ORDER — ACETAMINOPHEN 650 MG RE SUPP: 650.0000 mg | Freq: Four times a day (QID) | RECTAL | Status: DC | PRN

## 2022-04-06 MED ORDER — ACETAMINOPHEN 325 MG PO TABS
650.0000 mg | ORAL_TABLET | Freq: Four times a day (QID) | ORAL | Status: DC | PRN
  Administered 2022-04-10: 650 mg via ORAL
  Filled 2022-04-06: qty 2

## 2022-04-06 MED ORDER — ONDANSETRON HCL 4 MG PO TABS: 4.0000 mg | ORAL_TABLET | Freq: Four times a day (QID) | ORAL | Status: DC | PRN

## 2022-04-06 MED ORDER — METFORMIN HCL ER 500 MG PO TB24
500.0000 mg | ORAL_TABLET | Freq: Two times a day (BID) | ORAL | Status: DC
  Administered 2022-04-07 – 2022-04-11 (×7): 500 mg via ORAL
  Filled 2022-04-06 (×11): qty 1

## 2022-04-06 MED ORDER — GLIPIZIDE ER 5 MG PO TB24
10.0000 mg | ORAL_TABLET | Freq: Every day | ORAL | Status: DC
  Administered 2022-04-06: 10 mg via ORAL
  Filled 2022-04-06 (×3): qty 1

## 2022-04-06 MED ORDER — LOSARTAN POTASSIUM 25 MG PO TABS
25.0000 mg | ORAL_TABLET | Freq: Every day | ORAL | Status: DC
  Administered 2022-04-06: 25 mg via ORAL
  Filled 2022-04-06: qty 1

## 2022-04-06 MED ORDER — ONDANSETRON HCL 4 MG/2ML IJ SOLN: 4.0000 mg | Freq: Four times a day (QID) | INTRAMUSCULAR | Status: DC | PRN

## 2022-04-06 MED ORDER — GABAPENTIN 400 MG PO CAPS
400.0000 mg | ORAL_CAPSULE | Freq: Two times a day (BID) | ORAL | Status: DC
  Administered 2022-04-06 – 2022-04-11 (×11): 400 mg via ORAL
  Filled 2022-04-06 (×11): qty 1

## 2022-04-06 MED ORDER — PNEUMOCOCCAL 20-VAL CONJ VACC 0.5 ML IM SUSY
0.5000 mL | PREFILLED_SYRINGE | INTRAMUSCULAR | Status: DC
  Filled 2022-04-06: qty 0.5

## 2022-04-06 NOTE — ED Notes (Signed)
Pt initially attempted to refuse updated vitals and CBG check. This paramedic made pt aware that this was vital to her care and would help insure that she was able to get up to her inpatient bed in a timely manner. Pt agreed after she uses restroom.

## 2022-04-06 NOTE — ED Notes (Signed)
Pt urinated in the collection canister and then flushed it down the toilet. Pt made aware for the need of another sample.

## 2022-04-06 NOTE — H&P (Signed)
History and Physical    Patient: Kaitlyn Gray DVV:[id number] DOB: [birth year] DOA: 04/06/2022 DOS: the patient was seen and examined on 04/06/2022 PCP: Patient, No Pcp Per  Patient coming from: Home  Chief Complaint: No chief complaint on file.  HPI: Kaitlyn Gray is a 46 y.o. female with medical history significant of acute encephalopathy adjustment disorder, bipolar 1 disorder, cannabis use disorder, chronic anemia, cocaine use disorder, history of cervical fracture, hyperosmolar nonketotic state, type 2 diabetes, hypertension, left-sided weakness, dysarthria due to previous stroke who was recently admitted due to ischemic stroke, was supposed to be discharged to rehab but signed AMA and went home instead.  She has not taking her DAPT recommended by neurology since then.  She has had 3 or 4 falls while trying to ambulate at home.  She has no one else with her and has significant difficulty getting off the floor.  Denied loss of consciousness or other injuries during falls.  No fever, chills or night sweats. No sore throat, rhinorrhea, dyspnea, wheezing or hemoptysis.  No chest pain, palpitations, diaphoresis, PND, orthopnea or pitting edema of the lower extremities.  No appetite changes, abdominal pain, diarrhea, constipation, melena or hematochezia.  No flank pain, dysuria, frequency or hematuria.  No polyuria, polydipsia, polyphagia or blurred vision.  ED course: Initial vital signs were temperature 97.9 F, pulse 65, respirations 16, BP 151/84 mmHg O2 sat 100% on room air.  Lab work: UDS was positive for cocaine.  CBC with a white count of 5.9, hemoglobin 7.4 g/dL platelets 271.  Alcohol level less than 10 mg deciliter.  CMP showed a chloride of 113 mmol/L, glucose of 121 mg/dL and all other labs are normal after calcium is corrected.  Imaging: CT head without contrast with no new acute intracranial abnormality.  There is a known cluster of acute infarcts in the left frontoparietal  vertex and right medial periocular lobe better seen on prior MRI.  There is a remote infarct of the medial right occipital lobe.   Review of Systems: As mentioned in the history of present illness. All other systems reviewed and are negative. 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    Encounter for assessment of healthcare decision-making capacity    History of cervical fracture 12/24/2017   nondisplaced fracture lateral mass of C1 on the right on CT 12/24/17   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 ([place]) 07/19/2017   Hypertension    Ischemic stroke ([place]) 01/01/2020   subacute right middle cerebellar peduncle and pons infarction   Left-sided weakness 01/27/2022   Head CT with remote right occipital infarct which is consistent with old left-sided weakness   MDD (major depressive disorder), recurrent severe, without psychosis ([place]) 03/24/2015   Normocytic anemia 02/09/2020   Opiate use    History of Suboxone Therapy until 05/2021   Polysubstance abuse ([place]) 07/19/2017   Prescription drug misadventures ([place])    Stroke [facility])    Past Surgical History:  Procedure Laterality Date   CESAREAN SECTION     Social History:  reports that she has been smoking cigarettes. She has been smoking an average of .5 packs per day.  She has never used smokeless tobacco. She reports current drug use. Drugs: Marijuana and Cocaine. She reports that she does not drink alcohol.  Allergies  Allergen Reactions   Hydrocodone Itching   Latex Rash    Family History  Problem Relation Age of Onset   Hypertension Mother    CAD Mother 61       died  of MI at age 71   Hypertension Father     Prior to Admission medications   Medication Sig Start Date End Date Taking? Authorizing Provider  aspirin EC 81 MG tablet Take 1 tablet (81 mg total) by mouth daily. Swallow whole. 04/05/22   August Albino, MD  blood glucose meter kit and supplies KIT Dispense based on patient and insurance preference. Use up to four times daily as directed. (FOR ICD-9 250.00, 250.01). Please give 3 months supply of lancets and test strips to allow her to check QID. 12/07/17   Roxan Hockey, MD  blood glucose meter kit and supplies Relion Prime or Dispense other brand based on patient and insurance preference. Use up to four times daily as directed. (FOR ICD-9 250.00, 250.01). 12/07/17   Roxan Hockey, MD  clopidogrel (PLAVIX) 75 MG tablet Take 1 tablet (75 mg total) by mouth daily. 04/05/22   August Albino, MD  ferrous sulfate 325 (65 FE) MG tablet Take 1 tablet (325 mg total) by mouth every other day. 04/05/22   August Albino, MD  gabapentin (NEURONTIN) 400 MG capsule Take 1 capsule (400 mg total) by mouth 2 (two) times daily. 09/13/20   Nita Sells, MD  glipiZIDE (GLUCOTROL XL) 10 MG 24 hr tablet Take 10 mg by mouth daily. 10/17/21   [provider]  Insulin Pen Needle 31G X 5 MM MISC Use as directed 12/07/17   Roxan Hockey, MD  losartan (COZAAR) 25 MG tablet Take 1 tablet (25 mg total) by mouth daily. 04/05/22   August Albino, MD  metFORMIN (GLUCOPHAGE-XR) 500 MG 24 hr tablet Take 1 tablet (500 mg total) by mouth 2 (two) times daily. 04/04/22   August Albino, MD  QUEtiapine (SEROQUEL) 200 MG tablet Take 200 mg by mouth at bedtime. Patient not taking: Reported on 02/28/2022 08/31/20   [provider]  rosuvastatin (CRESTOR) 40 MG tablet Take 1 tablet (40 mg total) by mouth daily. 04/05/22   August Albino, MD    Physical Exam: Vitals:   04/06/22 0712 04/06/22 0714 04/06/22 0745 04/06/22 1000  BP:  (!) 151/84 (!) 140/72 (!) 158/103   Pulse:  65 62 64  Resp:  '16 16 20  ' Temp:  97.9 F (36.6 C)  97.8 F (36.6 C)  TempSrc:  Oral  Oral  SpO2: 100% 100% 99% 100%   Physical Exam Vitals and nursing note reviewed.  Constitutional:      General: She is not in acute distress.    Appearance: Normal appearance. She is obese.  HENT:     Head: Normocephalic.     Nose: No rhinorrhea.     Mouth/Throat:     Mouth: Mucous membranes are moist.  Eyes:     General: No scleral icterus.    Pupils: Pupils are equal, round, and reactive to light.  Neck:     Vascular: No JVD.  Cardiovascular:     Rate and Rhythm: Normal rate and regular rhythm.     Heart sounds: S1 normal and S2 normal.  Pulmonary:     Effort: Pulmonary effort is normal.  Abdominal:  General: Bowel sounds are normal.     Palpations: Abdomen is soft.     Tenderness: There is no abdominal tenderness.  Musculoskeletal:     Cervical back: Neck supple.     Right lower leg: No edema.     Left lower leg: No edema.  Neurological:     Mental Status: She is alert and oriented to person, place, and time.     Motor: Weakness present.     Coordination: Coordination abnormal.     Comments: Left-sided 3.5/5 hemiparesis.  Decree sensation and impaired coordination on the left.  At baseline.  Psychiatric:        Mood and Affect: Mood normal.        Behavior: Behavior normal. Behavior is cooperative.    Data Reviewed:  There are no new results to review at this time.  Assessment and Plan: Principal Problem:   Stroke (cerebrum) ([place]) Associated with: Hemiparesis affecting left side as late effect  Observation/telemetry Neurochecks every 4 hours. Consult PT and OT. Consult TOC. Risk factors modifications. Needs more medication compliance. Resume DAPT, statin and antihypertensives.  Active Problems:   Type 2 diabetes mellitus ([place]) Patient stated she was not a diabetic. Reassured that she has diabetes. Hemoglobin A1c was 6.6%.  Carbohydrate modified  diet. Resume glipizide 10 mg p.o. daily. Resume metformin 500 mg p.o. twice daily.    Hypertension associated with diabetes ([place]) Resume losartan 25 mg p.o. daily. Begin amlodipine 2.5 mg p.o. daily.    Cocaine use disorder, severe, dependence ([place]) Advised ceasing this habit. Avoid beta-blockers for the next 48 hours.    Tobacco abuse Smoking cessation advised. Nicotine replacement therapy as needed ordered.    MDD (major depressive disorder), recurrent severe, without psychosis (HCC) Continue Seroquel 200 mg p.o. at bedtime    Microcytic anemia Monitor hematocrit and hemoglobin.    Noncompliance with medications Advised compliance to avoid complications of medical conditions.    Class 1 obesity Current BMI 34.28 kg/m.     Advance Care Planning:   Code Status: Prior   Consults:   Family Communication:   Severity of Illness: The appropriate patient status for this patient is OBSERVATION. Observation status is judged to be reasonable and necessary in order to provide the required intensity of service to ensure the patient's safety. The patient's presenting symptoms, physical exam findings, and initial radiographic and laboratory data in the context of their medical condition is felt to place them at decreased risk for further clinical deterioration. Furthermore, it is anticipated that the patient will be medically stable for discharge from the hospital within 2 midnights of admission.   Author: Reubin Milan, MD 04/06/2022 10:47 AM  For on call review [email or web address].   This document was prepared using Dragon voice recognition software and may contain some unintended transcription errors.

## 2022-04-06 NOTE — ED Triage Notes (Signed)
Patient is coming for left sided weakness that is not new from a previous stroke

## 2022-04-06 NOTE — ED Provider Notes (Signed)
[facility] DEPT Provider Note   CSN: [id number] Arrival date & time: 04/06/22  2831     History {Add pertinent medical, surgical, social history, OB history to HPI:1} No chief complaint on file.   Kaitlyn Gray is a 46 y.o. female.  Patient has a history of diabetes hypertension stroke anemia.  She was admitted 1 week ago with stroke symptoms she has significant weakness on the left side and some weakness on the right side.  She left AMA 2 days ago but states that she is unable to take care of herself because of severe weakness on the left side and weakness on the right side.  She states that she feels unsteady like she is going to fall  The history is provided by the patient and medical records. No language interpreter was used.  Weakness Severity:  Severe Onset quality:  Sudden Timing:  Constant Progression:  Unchanged Chronicity:  New Context: not alcohol use   Relieved by:  Nothing Worsened by:  Nothing Ineffective treatments:  None tried Associated symptoms: no abdominal pain, no chest pain, no cough, no diarrhea, no frequency, no headaches and no seizures        Home Medications Prior to Admission medications   Medication Sig Start Date End Date Taking? Authorizing Provider  aspirin EC 81 MG tablet Take 1 tablet (81 mg total) by mouth daily. Swallow whole. 04/05/22   August Albino, MD  blood glucose meter kit and supplies KIT Dispense based on patient and insurance preference. Use up to four times daily as directed. (FOR ICD-9 250.00, 250.01). Please give 3 months supply of lancets and test strips to allow her to check QID. 12/07/17   Roxan Hockey, MD  blood glucose meter kit and supplies Relion Prime or Dispense other brand based on patient and insurance preference. Use up to four times daily as directed. (FOR ICD-9 250.00, 250.01). 12/07/17   Roxan Hockey, MD  clopidogrel (PLAVIX) 75 MG tablet Take 1 tablet (75 mg total) by  mouth daily. 04/05/22   August Albino, MD  ferrous sulfate 325 (65 FE) MG tablet Take 1 tablet (325 mg total) by mouth every other day. 04/05/22   August Albino, MD  gabapentin (NEURONTIN) 400 MG capsule Take 1 capsule (400 mg total) by mouth 2 (two) times daily. 09/13/20   Nita Sells, MD  glipiZIDE (GLUCOTROL XL) 10 MG 24 hr tablet Take 10 mg by mouth daily. 10/17/21   [provider]  Insulin Pen Needle 31G X 5 MM MISC Use as directed 12/07/17   Roxan Hockey, MD  losartan (COZAAR) 25 MG tablet Take 1 tablet (25 mg total) by mouth daily. 04/05/22   August Albino, MD  metFORMIN (GLUCOPHAGE-XR) 500 MG 24 hr tablet Take 1 tablet (500 mg total) by mouth 2 (two) times daily. 04/04/22   August Albino, MD  QUEtiapine (SEROQUEL) 200 MG tablet Take 200 mg by mouth at bedtime. Patient not taking: Reported on 02/28/2022 08/31/20   [provider]  rosuvastatin (CRESTOR) 40 MG tablet Take 1 tablet (40 mg total) by mouth daily. 04/05/22   August Albino, MD      Allergies    Hydrocodone and Latex    Review of Systems   Review of Systems  Constitutional:  Negative for appetite change and fatigue.  HENT:  Negative for congestion, ear discharge and sinus pressure.   Eyes:  Negative for discharge.  Respiratory:  Negative for cough.   Cardiovascular:  Negative for chest pain.  Gastrointestinal:  Negative for abdominal pain and diarrhea.  Genitourinary:  Negative for frequency and hematuria.  Musculoskeletal:  Negative for back pain.  Skin:  Negative for rash.  Neurological:  Positive for weakness. Negative for seizures and headaches.  Psychiatric/Behavioral:  Negative for hallucinations.     Physical Exam Updated Vital Signs BP (!) 158/103 (BP Location: Left Arm)   Pulse 64   Temp 97.8 F (36.6 C) (Oral)   Resp 20   SpO2 100%  Physical Exam Vitals and nursing note reviewed.  Constitutional:      Appearance: She is well-developed.  HENT:     Head: Normocephalic.      Nose: Nose normal.  Eyes:     General: No scleral icterus.    Conjunctiva/sclera: Conjunctivae normal.  Neck:     Thyroid: No thyromegaly.  Cardiovascular:     Rate and Rhythm: Normal rate and regular rhythm.     Heart sounds: No murmur heard.    No friction rub. No gallop.  Pulmonary:     Breath sounds: No stridor. No wheezing or rales.  Chest:     Chest wall: No tenderness.  Abdominal:     General: There is no distension.     Tenderness: There is no abdominal tenderness. There is no rebound.  Musculoskeletal:     Cervical back: Neck supple.     Comments: Severe weakness in both lower legs left arm.  Lymphadenopathy:     Cervical: No cervical adenopathy.  Skin:    Findings: No erythema or rash.  Neurological:     Mental Status: She is alert and oriented to person, place, and time.     Motor: No abnormal muscle tone.     Coordination: Coordination normal.  Psychiatric:        Behavior: Behavior normal.     ED Results / Procedures / Treatments   Labs (all labs ordered are listed, but only abnormal results are displayed) Labs Reviewed  CBC WITH DIFFERENTIAL/PLATELET - Abnormal; Notable for the following components:      Result Value   RBC 3.58 (*)    Hemoglobin 7.4 (*)    HCT 26.2 (*)    MCV 73.2 (*)    MCH 20.7 (*)    MCHC 28.2 (*)    RDW 19.2 (*)    All other components within normal limits  COMPREHENSIVE METABOLIC PANEL - Abnormal; Notable for the following components:   Chloride 113 (*)    Glucose, Bld 121 (*)    Calcium 8.5 (*)    All other components within normal limits  URINALYSIS, ROUTINE W REFLEX MICROSCOPIC  RAPID URINE DRUG SCREEN, HOSP PERFORMED  ETHANOL    EKG None  Radiology CT Head Wo Contrast  Result Date: 04/06/2022 CLINICAL DATA:  Dizziness EXAM: CT HEAD WITHOUT CONTRAST TECHNIQUE: Contiguous axial images were obtained from the base of the skull through the vertex without intravenous contrast. RADIATION DOSE REDUCTION: This exam was  performed according to the departmental dose-optimization program which includes automated exposure control, adjustment of the mA and/or kV according to patient size and/or use of iterative reconstruction technique. COMPARISON:  Recent CT and MR imaging of the head 03/30/2022 FINDINGS: Brain: No evidence of acute infarction, hemorrhage, hydrocephalus, extra-axial collection or mass lesion/mass effect. Focal encephalomalacia in the medial right occipital lobe again noted. Known cluster of acute infarcts in the left frontoparietal vertex and right medial parietal lobe better demonstrated on prior MR imaging. Vascular: No hyperdense vessel or unexpected calcification. Skull:  Normal. Negative for fracture or focal lesion. Sinuses/Orbits: No acute finding. Other: None. IMPRESSION: 1. No new acute intracranial abnormality. 2. Cluster of known acute infarcts in the left frontoparietal vertex and right medial parietal lobe better demonstrated on recent prior MR imaging. 3. Sequelae of remote infarct to the medial right occipital lobe again noted. Electronically Signed   By: Jacqulynn Cadet M.D.   On: 04/06/2022 08:20    Procedures Procedures  {Document cardiac monitor, telemetry assessment procedure when appropriate:1}  Medications Ordered in ED Medications - No data to display  ED Course/ Medical Decision Making/ A&P                           Medical Decision Making Amount and/or Complexity of Data Reviewed Labs: ordered. Radiology: ordered.  Risk Decision regarding hospitalization.  Patient with a stroke 48 week old.  Patient also has poorly controlled hypertension and anemia.  She is unable to care for self.  She will be admitted to medicine to control her blood pressure and physical therapy with possible rehab  {Document critical care time when appropriate:1} {Document review of labs and clinical decision tools ie heart score, Chads2Vasc2 etc:1}  {Document your independent review of radiology  images, and any outside records:1} {Document your discussion with family members, caretakers, and with consultants:1} {Document social determinants of health affecting pt's care:1} {Document your decision making why or why not admission, treatments were needed:1} Final Clinical Impression(s) / ED Diagnoses Final diagnoses:  Stroke determined by clinical assessment ([facility])    Rx / DC Orders ED Discharge Orders     None

## 2022-04-07 DIAGNOSIS — F319 Bipolar disorder, unspecified: Secondary | ICD-10-CM

## 2022-04-07 DIAGNOSIS — I63523 Cerebral infarction due to unspecified occlusion or stenosis of bilateral anterior cerebral arteries: Secondary | ICD-10-CM

## 2022-04-07 DIAGNOSIS — Y92009 Unspecified place in unspecified non-institutional (private) residence as the place of occurrence of the external cause: Secondary | ICD-10-CM

## 2022-04-07 DIAGNOSIS — I639 Cerebral infarction, unspecified: Secondary | ICD-10-CM | POA: Diagnosis not present

## 2022-04-07 DIAGNOSIS — E11649 Type 2 diabetes mellitus with hypoglycemia without coma: Secondary | ICD-10-CM

## 2022-04-07 DIAGNOSIS — E669 Obesity, unspecified: Secondary | ICD-10-CM | POA: Diagnosis not present

## 2022-04-07 DIAGNOSIS — R262 Difficulty in walking, not elsewhere classified: Secondary | ICD-10-CM | POA: Diagnosis not present

## 2022-04-07 DIAGNOSIS — F1910 Other psychoactive substance abuse, uncomplicated: Secondary | ICD-10-CM

## 2022-04-07 DIAGNOSIS — W19XXXA Unspecified fall, initial encounter: Secondary | ICD-10-CM

## 2022-04-07 DIAGNOSIS — D509 Iron deficiency anemia, unspecified: Secondary | ICD-10-CM

## 2022-04-07 DIAGNOSIS — I6521 Occlusion and stenosis of right carotid artery: Secondary | ICD-10-CM

## 2022-04-07 DIAGNOSIS — F32A Depression, unspecified: Secondary | ICD-10-CM

## 2022-04-07 LAB — VITAMIN B12: Vitamin B-12: 298 pg/mL (ref 180–914)

## 2022-04-07 LAB — CBC
HCT: 25.1 % — ABNORMAL LOW (ref 36.0–46.0)
Hemoglobin: 7.3 g/dL — ABNORMAL LOW (ref 12.0–15.0)
MCH: 20.9 pg — ABNORMAL LOW (ref 26.0–34.0)
MCHC: 29.1 g/dL — ABNORMAL LOW (ref 30.0–36.0)
MCV: 71.7 fL — ABNORMAL LOW (ref 80.0–100.0)
Platelets: 282 10*3/uL (ref 150–400)
RBC: 3.5 MIL/uL — ABNORMAL LOW (ref 3.87–5.11)
RDW: 19 % — ABNORMAL HIGH (ref 11.5–15.5)
WBC: 5.6 10*3/uL (ref 4.0–10.5)
nRBC: 0 % (ref 0.0–0.2)

## 2022-04-07 LAB — GLUCOSE, CAPILLARY
Glucose-Capillary: 82 mg/dL (ref 70–99)
Glucose-Capillary: 54 mg/dL — ABNORMAL LOW (ref 70–99)
Glucose-Capillary: 124 mg/dL — ABNORMAL HIGH (ref 70–99)
Glucose-Capillary: 96 mg/dL (ref 70–99)

## 2022-04-07 MED ORDER — CYANOCOBALAMIN 1000 MCG/ML IJ SOLN
1000.0000 ug | Freq: Every day | INTRAMUSCULAR | Status: AC
  Administered 2022-04-07 – 2022-04-08 (×2): 1000 ug via INTRAMUSCULAR
  Filled 2022-04-07 (×2): qty 1

## 2022-04-07 MED ORDER — LOSARTAN POTASSIUM 50 MG PO TABS
50.0000 mg | ORAL_TABLET | Freq: Every day | ORAL | Status: DC
  Administered 2022-04-07 – 2022-04-11 (×5): 50 mg via ORAL
  Filled 2022-04-07 (×5): qty 1

## 2022-04-07 MED ORDER — BISACODYL 10 MG RE SUPP
10.0000 mg | Freq: Once | RECTAL | Status: AC
  Administered 2022-04-07: 10 mg via RECTAL
  Filled 2022-04-07: qty 1

## 2022-04-07 MED ORDER — SODIUM CHLORIDE 0.9 % IV SOLN
250.0000 mg | Freq: Every day | INTRAVENOUS | Status: AC
  Administered 2022-04-07 – 2022-04-08 (×2): 250 mg via INTRAVENOUS
  Filled 2022-04-07 (×2): qty 20

## 2022-04-07 MED ORDER — VITAMIN B-12 1000 MCG PO TABS
1000.0000 ug | ORAL_TABLET | Freq: Every day | ORAL | Status: DC
  Administered 2022-04-09 – 2022-04-11 (×3): 1000 ug via ORAL
  Filled 2022-04-07 (×3): qty 1

## 2022-04-07 MED ORDER — STROKE: EARLY STAGES OF RECOVERY BOOK
Freq: Once | Status: AC
  Filled 2022-04-07: qty 1

## 2022-04-07 NOTE — Evaluation (Signed)
Physical Therapy Evaluation Patient Details Name: Kaitlyn Gray MRN: [id number] DOB: Jul 09, 1975 Today's Date: 04/07/2022  History of Present Illness  Kaitlyn Gray is a 46 y.o. female with history of recent L frontoparietal infarcts,Came  back to ED  04/06/22 with weakness of left and right sides,unable to care forself..PMH: DM, strokes,.anemia, cervical fracture.  She was admitted 1 week ago with stroke symptoms,and  left AMA 2 days ago but states that she is unable to take care of herself because of severe weakness on the left side and weakness on the right side.  Clinical Impression  The patient admitted for above medical problems. Patient  initially very drowsy, poor effort to lift arms  to face, poor effort to flex the legs in supine. Patient then able to  roll and  move to sitting up on bed edge  with min assistance, patient noted to move the legs and use UE's. Patient slowly able to slowly return legs onto bed and assisted with scooting up in the bed.  Patient then "fell asleep." Patient stood x 2 from bed at Rw with mod assist, trunk tending to lean forward, patient able to reach both hands to grasp RW, Patient stood and stepped sideways x 3 steps. Patient declined to  transfer to recliner.  Pt admitted with above diagnosis.  Pt currently with functional limitations due to the deficits listed below (see PT Problem List). Pt will benefit from skilled PT to increase their independence and safety with mobility to allow discharge to the venue listed below.          Recommendations for follow up therapy are one component of a multi-disciplinary discharge planning process, led by the attending physician.  Recommendations may be updated based on patient status, additional functional criteria and insurance authorization.  Follow Up Recommendations Skilled nursing-short term rehab (<3 hours/day) Can patient physically be transported by private vehicle: No    Assistance Recommended at  Discharge Frequent or constant Supervision/Assistance  Patient can return home with the following  A lot of help with walking and/or transfers;A lot of help with bathing/dressing/bathroom;Assistance with cooking/housework;Direct supervision/assist for medications management;Direct supervision/assist for financial management;Assist for transportation;Help with stairs or ramp for entrance    Equipment Recommendations  (TBA)  Recommendations for Other Services       Functional Status Assessment Patient has had a recent decline in their functional status and demonstrates the ability to make significant improvements in function in a reasonable and predictable amount of time.     Precautions / Restrictions Precautions Precautions: Fall;Other (comment) Precaution Comments: high fall risk,      Mobility  Bed Mobility Overal bed mobility: Needs Assistance Bed Mobility: Supine to Sit, Sit to Supine     Supine to sit: Min assist Sit to supine: Min assist   General bed mobility comments: extra time  , rolls and pushed up with min assist from right side. able to  place legs back onto bed, slowly. able to scoot self up in bed, using legs and arms.    Transfers Overall transfer level: Needs assistance Equipment used: Rolling walker (2 wheels), None Transfers: Sit to/from Stand Sit to Stand: Mod assist, +2 physical assistance, +2 safety/equipment           General transfer comment: mod support, trunk tendency to lean forward. stepped along bed side x 4 steps. declined to get to recliner.    Ambulation/Gait  Stairs            Wheelchair Mobility    Modified Rankin (Stroke Patients Only)       Balance Overall balance assessment: Needs assistance Sitting-balance support: Feet supported, No upper extremity supported Sitting balance-Leahy Scale: Fair     Standing balance support: Bilateral upper extremity supported, Reliant on assistive device for  balance Standing balance-Leahy Scale: Poor Standing balance comment: needs UE support to maintain standing balance                             Pertinent Vitals/Pain Pain Assessment Pain Assessment: No/denies pain    Home Living Family/patient expects to be discharged to:: Skilled nursing facility Living Arrangements: Alone Available Help at Discharge:  (none) Type of Home: Apartment Home Access: Stairs to enter   Entrance Stairs-Number of Steps: 5 steps Alternate Level Stairs-Number of Steps: 13 Home Layout: Two level;Bed/bath upstairs Home Equipment: Rollator (4 wheels);BSC/3in1      Prior Function               Mobility Comments: use of Rollator for mobility (brakes are broken) ADLs Comments: reports left [facility], got to grocery store then could not get to apt. an aquaintance assisted her inside apartment, unable to manage and came back to ED.     Hand Dominance   Dominant Hand: Right    Extremity/Trunk Assessment   Upper Extremity Assessment Upper Extremity Assessment: Defer to OT evaluation (patient  demonstrated poor efforts to lift arms, more so on the Left,  yet after sitting up, patient able to place both hands onto RW,)    Lower Extremity Assessment Lower Extremity Assessment: RLE deficits/detail;LLE deficits/detail RLE Deficits / Details: poor effort in supine to flex the leg, able to extend the knee when sitting, poor dorsiflexionin standing able to bear weight, then lifted leg back onto bed RLE Coordination: decreased gross motor LLE Deficits / Details: demonstrated less effort with   hip flex and knee ext, able to bear weight to stand. poor dorsiflex LLE Coordination: decreased gross motor;decreased fine motor    Cervical / Trunk Assessment Cervical / Trunk Assessment: Other exceptions Cervical / Trunk Exceptions: initially unbalance, trunk titubating  Communication   Communication:  (slurred, childlike speach)  Cognition Arousal/Alertness:  Lethargic Behavior During Therapy: Flat affect Overall Cognitive Status: No family/caregiver present to determine baseline cognitive functioning Area of Impairment: Orientation, Safety/judgement                 Orientation Level: Time       Safety/Judgement: Decreased awareness of safety, Decreased awareness of deficits     General Comments: inconsistently follows directions, inconsistencies in motor, states she wants to nap.        General Comments      Exercises     Assessment/Plan    PT Assessment Patient needs continued PT services  PT Problem List Decreased strength;Decreased range of motion;Decreased activity tolerance;Decreased balance;Decreased mobility;Decreased coordination;Impaired sensation;Cardiopulmonary status limiting activity;Decreased safety awareness;Decreased knowledge of use of DME;Decreased cognition;Pain       PT Treatment Interventions Gait training;Stair training;Functional mobility training;Therapeutic activities;Wheelchair mobility training;Patient/family education;Cognitive remediation;Neuromuscular re-education;Balance training;DME instruction    PT Goals (Current goals can be found in the Care Plan section)  Acute Rehab PT Goals Patient Stated Goal: get rehab PT Goal Formulation: With patient Time For Goal Achievement: 04/21/22 Potential to Achieve Goals: Good    Frequency Min 3X/week     Co-evaluation  AM-PAC PT "6 Clicks" Mobility  Outcome Measure Help needed turning from your back to your side while in a flat bed without using bedrails?: A Lot Help needed moving from lying on your back to sitting on the side of a flat bed without using bedrails?: A Lot Help needed moving to and from a bed to a chair (including a wheelchair)?: Total Help needed standing up from a chair using your arms (e.g., wheelchair or bedside chair)?: Total Help needed to walk in hospital room?: Total Help needed climbing 3-5 steps with a  railing? : Total 6 Click Score: 8    End of Session Equipment Utilized During Treatment: Gait belt Activity Tolerance: Patient tolerated treatment well;Patient limited by lethargy;Patient limited by fatigue Patient left: in bed;with call bell/phone within reach;with bed alarm set Nurse Communication: Mobility status PT Visit Diagnosis: Unsteadiness on feet (R26.81);Other abnormalities of gait and mobility (R26.89);Muscle weakness (generalized) (M62.81);History of falling (Z91.81);Hemiplegia and hemiparesis;Pain Hemiplegia - Right/Left:  (? both) Hemiplegia - dominant/non-dominant: Dominant Hemiplegia - caused by: Cerebral infarction    Time: 1022-1045 PT Time Calculation (min) (ACUTE ONLY): 23 min   Charges:   PT Evaluation $PT Eval Low Complexity: 1 Low PT Treatments $Neuromuscular Re-education: 8-22 mins        Blanchard Kelch PT Acute Rehabilitation Services Office [phone] Weekend [phone]   Rada Hay 04/07/2022, 12:44 PM

## 2022-04-07 NOTE — Progress Notes (Signed)
FSBG 54. Presently eating chicken salad with grapes and drinking juice. A/Ox4. MD notified

## 2022-04-07 NOTE — Progress Notes (Signed)
PROGRESS NOTE  ALEXANDERIA GORBY NWG:[id number] DOB: 05-11-1976   PCP: Patient, No Pcp Per  Patient is from: Home.  Lives alone.  Uses walker at baseline.  DOA: 04/06/2022 LOS: 0  Chief complaints Chief Complaint  Patient presents with   Weakness    Left-side x 2 days     Brief Narrative / Interim history: 46 year old F with PMH of recent CVA, NIDDM-2, HTN, BPD 1, polysubstance use including marijuana, cocaine and tobacco, anemia and noncompliance returning with fall at home and difficulty ambulating.  Patient was hospitalized 10/15-10/20 for acute CVA.  At that time, MRI brain showed cluster of acute infarction at the left frontoparietal vertex, punctate acute infarct in right medial parietal lobe and old midbrain, cerebellar and cerebral infarcts.  CTA head and neck showed significant intracranial and extracranial stenosis.  UDS was positive for cocaine.  Neurology recommended DAPT for 3 months and then Plavix alone, and outpatient follow-up with neuro IR for right mid ICA stenosis.  Therapy recommended SNF but patient decided to leave AMA.  Patient did not continue his antiplatelet after leaving AMA.  On arrival to ED, vitals stable.  Hgb 7.4 (baseline about 8).  Otherwise, CMP and CBC without significant finding.  Glucose 121.  EtOH less than 10.  UDS positive for cocaine.  EKG without acute finding other than previously known CVA.   Subjective: Seen and examined earlier this morning.  No major events overnight or this morning.  Hypoglycemic to 54 this morning.  She received glipizide yesterday.  She reports bilateral leg and feet pain and weakness.  She is asking if she can be on regular diet since her blood glucose was low.  Objective: Vitals:   04/07/22 0014 04/07/22 0522 04/07/22 0756 04/07/22 1136  BP: (!) 170/81 (!) 154/84 (!) 143/93 (!) 143/78  Pulse: 80 (!) 58 68 76  Resp: 17 17 18 18   Temp: 99.1 F (37.3 C) 98.8 F (37.1 C) 98 F (36.7 C) 98.3 F (36.8 C)  TempSrc:  Oral Oral Oral Oral  SpO2: 99% 100% 100% 100%  Weight:      Height:        Examination:  GENERAL: No apparent distress.  Nontoxic. HEENT: MMM.  Vision and hearing grossly intact.  NECK: Supple.  No apparent JVD.  RESP:  No IWOB.  Fair aeration bilaterally. CVS:  RRR. Heart sounds normal.  ABD/GI/GU: BS+. Abd soft, NTND.  MSK/EXT:  Moves extremities but globally weak.  No edema. SKIN: no apparent skin lesion or wound NEURO: Awake, alert and oriented appropriately.  Some dysarthria.  Weakness in all extremities, more pronounced on the left (about 3/5).  No facial asymmetry.  PERRL.  Finger-to-nose seems to be intact. PSYCH: Calm. Normal affect.   Procedures:  None  Microbiology summarized: None  Assessment and plan: Principal Problem:   Stroke (cerebrum) (HCC) Active Problems:   CVA (cerebral vascular accident) (HCC)   Type 2 diabetes mellitus (HCC)   Hypertension associated with diabetes (HCC)   Cocaine use disorder, severe, dependence (HCC)   Depression   Bipolar disorder (HCC)   Polysubstance abuse (HCC)   Microcytic anemia   Noncompliance with medications   Hemiparesis affecting left side as late effect of cerebrovascular accident (CVA) (HCC)   Class 1 obesity   Tobacco abuse   Iron deficiency anemia   Fall at home, initial encounter  Fall at home/ambulatory dysfunction likely due to CVA and polysubstance use.  Patient is not able to care for herself.  He  lives alone.  No family members close by.  -PT/OT -Fall precaution -Needs placement.  Subacute CVA: hospitalized 10/15-10/20 MRI showed scattered infarcts in bilateral ACA territory with multifactorial intracranial and extracranial stenosis.  Neurology recommended Plavix and aspirin for 3 months followed by Plavix alone, and outpatient follow-up with neuro IR.  Therapy recommended SNF but patient left AMA.  CT head on admission without acute finding.  Ongoing cocaine use.  -Continue Plavix and aspirin for 3 months  followed by Plavix alone per previous neurology recommendation -Outpatient follow-up with neuro IR for right mid ICA 70% stenosis -Continue Crestor -Advised to refrain from cocaine, marijuana and tobacco. -PT/OT  Extracranial and intracranial arterial stenosis -Management as above  Controlled NIDDM-2 with hypoglycemia and neuropathy: A1c 6.6% on 10/15.  Hypoglycemia likely due to glipizide. Recent Labs  Lab 04/06/22 1349 04/06/22 1831 04/06/22 2157 04/07/22 0827 04/07/22 1134  GLUCAP 141* 113* 116* 82 54*  -Discontinue glipizide -Allow regular diet -Continue Crestor and gabapentin  Iron deficiency anemia: Patient denies melena or hematochezia.  Hgb down to 7.3 (7 to 8).  Anemia panel with iron deficiency previous hospitalization. Recent Labs    10/16/21 0928 11/24/21 1258 12/12/21 1245 12/26/21 0700 01/27/22 1505 02/28/22 1013 03/30/22 1058 04/01/22 0345 04/06/22 0922 04/07/22 0410  HGB 9.9* 8.3* 8.5* 8.1* 8.0*  9.2* 7.8* 7.9* 7.7* 7.4* 7.3*  -IV ferric gluconate 250 mg x 2 -May benefit from outpatient GI evaluation  Essential hypertension: Slightly elevated BP today -Increase losartan to 50 mg daily. -Avoid hypotension given intracranial and extracranial arterial stenosis.  Polysubstance use including cocaine, marijuana and tobacco -Counseled to refrain from substance use. -Not interested in nicotine patch.  Depression/bipolar disorder: Stable -Continue home Seroquel  Noncompliance with medication -Counseled.  Obesity Body mass index is 32.01 kg/m.          DVT prophylaxis:  SCDs Start: 04/06/22 1157  Code Status: Full code Family Communication: None at bedside Level of care: Telemetry Status is: Observation The patient will require care spanning > 2 midnights and should be moved to inpatient because: Subacute CVA, physical deconditioning   Final disposition: SNF Consultants:  None  Sch Meds:  Scheduled Meds:   stroke: early stages of  recovery book   Does not apply Once   amLODipine  2.5 mg Oral Daily   aspirin EC  81 mg Oral Daily   clopidogrel  75 mg Oral Daily   ferrous sulfate  325 mg Oral QODAY   gabapentin  400 mg Oral BID   influenza vac split quadrivalent PF  0.5 mL Intramuscular Tomorrow-1000   insulin aspart  0-15 Units Subcutaneous TID WC   losartan  50 mg Oral Daily   metFORMIN  500 mg Oral BID WC   pneumococcal 20-valent conjugate vaccine  0.5 mL Intramuscular Tomorrow-1000   QUEtiapine  200 mg Oral QHS   rosuvastatin  40 mg Oral Daily   Continuous Infusions:  ferric gluconate (FERRLECIT) IVPB 250 mg (04/07/22 0926)   PRN Meds:.acetaminophen **OR** acetaminophen, nicotine, ondansetron **OR** ondansetron (ZOFRAN) IV, mouth rinse  Antimicrobials: Anti-infectives (From admission, onward)    None        I have personally reviewed the following labs and images: CBC: Recent Labs  Lab 04/01/22 0345 04/06/22 0922 04/07/22 0410  WBC 4.6 5.9 5.6  NEUTROABS  --  3.6  --   HGB 7.7* 7.4* 7.3*  HCT 26.6* 26.2* 25.1*  MCV 71.7* 73.2* 71.7*  PLT 299 271 282   BMP &GFR Recent Labs  Lab 04/01/22 0345 04/06/22 0922  NA 142 140  K 3.5 3.9  CL 109 113*  CO2 26 22  GLUCOSE 105* 121*  BUN 19 19  CREATININE 0.91 0.72  CALCIUM 8.9 8.5*   Estimated Creatinine Clearance: 91.5 mL/min (by C-G formula based on SCr of 0.72 mg/dL). Liver & Pancreas: Recent Labs  Lab 04/06/22 0922  AST 30  ALT 34  ALKPHOS 64  BILITOT 0.3  PROT 7.1  ALBUMIN 3.5   No results for input(s): "LIPASE", "AMYLASE" in the last 168 hours. No results for input(s): "AMMONIA" in the last 168 hours. Diabetic: No results for input(s): "HGBA1C" in the last 72 hours. Recent Labs  Lab 04/06/22 1349 04/06/22 1831 04/06/22 2157 04/07/22 0827 04/07/22 1134  GLUCAP 141* 113* 116* 82 54*   Cardiac Enzymes: No results for input(s): "CKTOTAL", "CKMB", "CKMBINDEX", "TROPONINI" in the last 168 hours. No results for input(s):  "PROBNP" in the last 8760 hours. Coagulation Profile: No results for input(s): "INR", "PROTIME" in the last 168 hours. Thyroid Function Tests: No results for input(s): "TSH", "T4TOTAL", "FREET4", "T3FREE", "THYROIDAB" in the last 72 hours. Lipid Profile: No results for input(s): "CHOL", "HDL", "LDLCALC", "TRIG", "CHOLHDL", "LDLDIRECT" in the last 72 hours. Anemia Panel: Recent Labs    04/07/22 1001  VITAMINB12 298   Urine analysis:    Component Value Date/Time   COLORURINE YELLOW 03/30/2022 1039   APPEARANCEUR HAZY (A) 03/30/2022 1039   LABSPEC 1.023 03/30/2022 1039   PHURINE 6.0 03/30/2022 1039   GLUCOSEU 50 (A) 03/30/2022 1039   HGBUR NEGATIVE 03/30/2022 1039   BILIRUBINUR NEGATIVE 03/30/2022 1039   KETONESUR NEGATIVE 03/30/2022 1039   PROTEINUR NEGATIVE 03/30/2022 1039   UROBILINOGEN 0.2 10/06/2014 0856   NITRITE NEGATIVE 03/30/2022 1039   LEUKOCYTESUR NEGATIVE 03/30/2022 1039   Sepsis Labs: Invalid input(s): "PROCALCITONIN", "LACTICIDVEN"  Microbiology: No results found for this or any previous visit (from the past 240 hour(s)).  Radiology Studies: No results found.    Tangi Shroff T. Darick Fetters [facility]  If 7PM-7AM, please contact night-coverage [email or web address] 04/07/2022, 1:44 PM

## 2022-04-08 DIAGNOSIS — I6521 Occlusion and stenosis of right carotid artery: Secondary | ICD-10-CM | POA: Diagnosis present

## 2022-04-08 DIAGNOSIS — Y92009 Unspecified place in unspecified non-institutional (private) residence as the place of occurrence of the external cause: Secondary | ICD-10-CM | POA: Diagnosis not present

## 2022-04-08 DIAGNOSIS — F1420 Cocaine dependence, uncomplicated: Secondary | ICD-10-CM | POA: Diagnosis present

## 2022-04-08 DIAGNOSIS — K5900 Constipation, unspecified: Secondary | ICD-10-CM | POA: Diagnosis present

## 2022-04-08 DIAGNOSIS — I639 Cerebral infarction, unspecified: Secondary | ICD-10-CM | POA: Diagnosis present

## 2022-04-08 DIAGNOSIS — G8929 Other chronic pain: Secondary | ICD-10-CM | POA: Diagnosis present

## 2022-04-08 DIAGNOSIS — I771 Stricture of artery: Secondary | ICD-10-CM | POA: Diagnosis present

## 2022-04-08 DIAGNOSIS — E11649 Type 2 diabetes mellitus with hypoglycemia without coma: Secondary | ICD-10-CM | POA: Diagnosis not present

## 2022-04-08 DIAGNOSIS — I69354 Hemiplegia and hemiparesis following cerebral infarction affecting left non-dominant side: Secondary | ICD-10-CM | POA: Diagnosis not present

## 2022-04-08 DIAGNOSIS — I63523 Cerebral infarction due to unspecified occlusion or stenosis of bilateral anterior cerebral arteries: Secondary | ICD-10-CM | POA: Diagnosis present

## 2022-04-08 DIAGNOSIS — Z5329 Procedure and treatment not carried out because of patient's decision for other reasons: Secondary | ICD-10-CM | POA: Diagnosis present

## 2022-04-08 DIAGNOSIS — Z7982 Long term (current) use of aspirin: Secondary | ICD-10-CM | POA: Diagnosis not present

## 2022-04-08 DIAGNOSIS — F17210 Nicotine dependence, cigarettes, uncomplicated: Secondary | ICD-10-CM | POA: Diagnosis present

## 2022-04-08 DIAGNOSIS — W19XXXA Unspecified fall, initial encounter: Secondary | ICD-10-CM | POA: Diagnosis present

## 2022-04-08 DIAGNOSIS — Z885 Allergy status to narcotic agent status: Secondary | ICD-10-CM | POA: Diagnosis not present

## 2022-04-08 DIAGNOSIS — R262 Difficulty in walking, not elsewhere classified: Secondary | ICD-10-CM | POA: Diagnosis not present

## 2022-04-08 DIAGNOSIS — E1169 Type 2 diabetes mellitus with other specified complication: Secondary | ICD-10-CM | POA: Diagnosis present

## 2022-04-08 DIAGNOSIS — I152 Hypertension secondary to endocrine disorders: Secondary | ICD-10-CM | POA: Diagnosis present

## 2022-04-08 DIAGNOSIS — I69322 Dysarthria following cerebral infarction: Secondary | ICD-10-CM | POA: Diagnosis not present

## 2022-04-08 DIAGNOSIS — Z8249 Family history of ischemic heart disease and other diseases of the circulatory system: Secondary | ICD-10-CM | POA: Diagnosis not present

## 2022-04-08 DIAGNOSIS — Z91040 Latex allergy status: Secondary | ICD-10-CM | POA: Diagnosis not present

## 2022-04-08 DIAGNOSIS — E669 Obesity, unspecified: Secondary | ICD-10-CM | POA: Diagnosis present

## 2022-04-08 DIAGNOSIS — F319 Bipolar disorder, unspecified: Secondary | ICD-10-CM | POA: Diagnosis present

## 2022-04-08 DIAGNOSIS — D509 Iron deficiency anemia, unspecified: Secondary | ICD-10-CM | POA: Diagnosis present

## 2022-04-08 DIAGNOSIS — Z79899 Other long term (current) drug therapy: Secondary | ICD-10-CM | POA: Diagnosis not present

## 2022-04-08 DIAGNOSIS — F4324 Adjustment disorder with disturbance of conduct: Secondary | ICD-10-CM | POA: Diagnosis present

## 2022-04-08 DIAGNOSIS — Z794 Long term (current) use of insulin: Secondary | ICD-10-CM | POA: Diagnosis not present

## 2022-04-08 DIAGNOSIS — R531 Weakness: Secondary | ICD-10-CM | POA: Diagnosis present

## 2022-04-08 LAB — CBC
HCT: 25 % — ABNORMAL LOW (ref 36.0–46.0)
Hemoglobin: 7.1 g/dL — ABNORMAL LOW (ref 12.0–15.0)
MCH: 20.8 pg — ABNORMAL LOW (ref 26.0–34.0)
MCHC: 28.4 g/dL — ABNORMAL LOW (ref 30.0–36.0)
MCV: 73.1 fL — ABNORMAL LOW (ref 80.0–100.0)
Platelets: 292 10*3/uL (ref 150–400)
RBC: 3.42 MIL/uL — ABNORMAL LOW (ref 3.87–5.11)
RDW: 19.3 % — ABNORMAL HIGH (ref 11.5–15.5)
WBC: 5.7 10*3/uL (ref 4.0–10.5)
nRBC: 0 % (ref 0.0–0.2)

## 2022-04-08 LAB — GLUCOSE, CAPILLARY
Glucose-Capillary: 95 mg/dL (ref 70–99)
Glucose-Capillary: 105 mg/dL — ABNORMAL HIGH (ref 70–99)
Glucose-Capillary: 190 mg/dL — ABNORMAL HIGH (ref 70–99)
Glucose-Capillary: 120 mg/dL — ABNORMAL HIGH (ref 70–99)

## 2022-04-08 LAB — RENAL FUNCTION PANEL
Albumin: 2.7 g/dL — ABNORMAL LOW (ref 3.5–5.0)
Anion gap: 3 — ABNORMAL LOW (ref 5–15)
BUN: 23 mg/dL — ABNORMAL HIGH (ref 6–20)
CO2: 24 mmol/L (ref 22–32)
Calcium: 8.2 mg/dL — ABNORMAL LOW (ref 8.9–10.3)
Chloride: 108 mmol/L (ref 98–111)
Creatinine, Ser: 0.79 mg/dL (ref 0.44–1.00)
GFR, Estimated: >60 mL/min (ref >60)
Glucose, Bld: 105 mg/dL — ABNORMAL HIGH (ref 70–99)
Phosphorus: 3.2 mg/dL (ref 2.5–4.6)
Potassium: 3.7 mmol/L (ref 3.5–5.1)
Sodium: 135 mmol/L (ref 135–145)

## 2022-04-08 LAB — PREPARE RBC (CROSSMATCH)

## 2022-04-08 LAB — MAGNESIUM: Magnesium: 1.9 mg/dL (ref 1.7–2.4)

## 2022-04-08 MED ORDER — BISACODYL 10 MG RE SUPP
10.0000 mg | Freq: Once | RECTAL | Status: AC
  Administered 2022-04-08: 10 mg via RECTAL
  Filled 2022-04-08: qty 1

## 2022-04-08 MED ORDER — ALUM & MAG HYDROXIDE-SIMETH 200-200-20 MG/5ML PO SUSP
30.0000 mL | ORAL | Status: DC | PRN
  Administered 2022-04-08: 30 mL via ORAL

## 2022-04-08 MED ORDER — SODIUM CHLORIDE 0.9 % IV SOLN: INTRAVENOUS | Status: AC

## 2022-04-08 MED ORDER — ALUM & MAG HYDROXIDE-SIMETH 200-200-20 MG/5ML PO SUSP
ORAL | Status: AC
  Filled 2022-04-08: qty 30

## 2022-04-08 MED ORDER — SODIUM CHLORIDE 0.9% IV SOLUTION: Freq: Once | INTRAVENOUS | Status: DC

## 2022-04-08 NOTE — TOC Initial Note (Signed)
Transition of Care [facility]) - Initial/Assessment Note    Patient Details  Name: Kaitlyn Gray MRN: [id number] Date of Birth: [birth year]  Transition of Care [facility]) CM/SW Contact:    Lavenia Atlas, RN Phone Number: 04/08/2022, 10:21 PM  Clinical Narrative:       Late documentation: PT recommended short term SNF. Spoke with patient at bedside however receiving patient care. Patient does not have a SNF preference. This RNCM will initiate SNF process. Patient reports she will need a note for the [facility] court system due to court today.   TOC will continue to follow.            Expected Discharge Plan: Skilled Nursing Facility Barriers to Discharge: Continued Medical Work up   Patient Goals and CMS Choice        Expected Discharge Plan and Services Expected Discharge Plan: Skilled Nursing Facility In-house Referral: NA Discharge Planning Services: CM Consult                                          Prior Living Arrangements/Services                       Activities of Daily Living Home Assistive Devices/Equipment: Environmental consultant (specify type) (four wheel) ADL Screening (condition at time of admission) Patient's cognitive ability adequate to safely complete daily activities?: Yes Is the patient deaf or have difficulty hearing?: No Does the patient have difficulty seeing, even when wearing glasses/contacts?: No Does the patient have difficulty concentrating, remembering, or making decisions?: Yes Patient able to express need for assistance with ADLs?: Yes Does the patient have difficulty dressing or bathing?: Yes Independently performs ADLs?: No Communication: Independent Dressing (OT): Independent Feeding: Independent Bathing: Needs assistance Is this a change from baseline?: Pre-admission baseline Toileting: Needs assistance Is this a change from baseline?: Pre-admission baseline In/Out Bed: Needs assistance Is this a change from baseline?:  Pre-admission baseline Walks in Home: Independent with device (comment) (four wheel walker) Does the patient have difficulty walking or climbing stairs?: No Weakness of Legs: Both Weakness of Arms/Hands: Both  Permission Sought/Granted                  Emotional Assessment              Admission diagnosis:  Stroke (cerebrum) (HCC) [I63.9] Stroke determined by clinical assessment [facility]) [I63.9] Patient Active Problem List   Diagnosis Date Noted   Iron deficiency anemia 04/07/2022   Fall at home, initial encounter 04/07/2022   Ambulatory dysfunction 04/07/2022   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 04/07/2022   Stroke (cerebrum) (HCC) 04/06/2022   Class 1 obesity 04/06/2022   Tobacco abuse 04/06/2022   Abdominal pain 04/04/2022   History of stroke 03/31/2022   Hemiparesis affecting left side as late effect of cerebrovascular accident (CVA) (HCC) 03/31/2022   Opiate use    Microcytic anemia 03/30/2022   Chronic anemia    Noncompliance with medications    CVA (cerebral vascular accident) (HCC) 09/06/2020   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   History of cervical fracture 12/24/2017   Type 2 diabetes mellitus (HCC) 12/06/2017   Hypertension associated with diabetes (HCC) 12/06/2017   Bipolar disorder (HCC) 07/19/2017   Polysubstance abuse (HCC) 07/19/2017   Cocaine use disorder, severe, dependence (HCC) 03/24/2015   Depression 03/24/2015  Cannabis abuse 03/23/2013   PCP:  Patient, No Pcp Per Pharmacy:   [facility] [place], [address] [place] [address] Phone: [phone] Fax: [phone]     Social Determinants of Health (SDOH) Interventions    Readmission Risk Interventions     No data to display

## 2022-04-08 NOTE — Progress Notes (Signed)
Occupational Therapy Evaluation Patient Details Name: Kaitlyn Gray MRN: [id number] DOB: [birth year] Today's Date: 04/08/2022   History of Present Illness Kaitlyn Gray is a 46 y.o. female with medical history significant of acute encephalopathy adjustment disorder, bipolar 1 disorder, cannabis use disorder, chronic anemia, cocaine use disorder, history of cervical fracture, hyperosmolar nonketotic state, type 2 diabetes, hypertension, left-sided weakness, dysarthria due to previous stroke who was recently admitted due to ischemic stroke, was supposed to be discharged to rehab but signed AMA and went home instead.  She has not taking her DAPT recommended by neurology since then.  She has had 3 or 4 falls while trying to ambulate at home.  She has no one else with her and has significant difficulty getting off the floor.  Denied loss of consciousness or other injuries during falls.  No fever, chills or night sweats. No sore throat, rhinorrhea, dyspnea, wheezing or hemoptysis.  No chest pain, palpitations, diaphoresis, PND, orthopnea or pitting edema of the lower extremities.  No appetite changes, abdominal pain, diarrhea, constipation, melena or hematochezia.  No flank pain, dysuria, frequency or hematuria.  No polyuria, polydipsia, polyphagia or blurred vision.   Clinical Impression   Patient reported she has had 4 CVAs, dating back to 2021. She also stated she has had several falls at home, she normally has significant difficulty managing self-care tasks including bathing, dressing, cooking, and cleaning, and she has minimal to no social/family assistance & support. She was noted to be limited by reports of partial numbness of her L UE and B LE, suspected R LE motor control deficits, impaired B UE fine motor coordination (worse in L UE), impaired standing balance, impaired functional mobility, impaired ADL performance, and vision impairment (reports of blurriness and occasional floaters). She stood  with min assist from an elevated bed surface using a RW for support & required mod assist for lower body dressing. Without further OT services, she is at high risk for falls and restricted ADL participation.      Recommendations for follow up therapy are one component of a multi-disciplinary discharge planning process, led by the attending physician.  Recommendations may be updated based on patient status, additional functional criteria and insurance authorization.   Follow Up Recommendations  Skilled nursing-short term rehab (<3 hours/day)    Assistance Recommended at Discharge Frequent or constant Supervision/Assistance  Patient can return home with the following A lot of help with walking and/or transfers;A lot of help with bathing/dressing/bathroom;Assistance with cooking/housework;Help with stairs or ramp for entrance    Functional Status Assessment  Patient has had a recent decline in their functional status and demonstrates the ability to make significant improvements in function in a reasonable and predictable amount of time.  Equipment Recommendations  Other (comment)       Precautions / Restrictions Precautions Precautions: Fall;Other (comment) Precaution Comments: high fall risk, Restrictions Weight Bearing Restrictions: No Other Position/Activity Restrictions: L sided weakness, decreased motor control of R LE      Mobility Bed Mobility Overal bed mobility: Needs Assistance Bed Mobility: Supine to Sit, Sit to Supine     Supine to sit: Min assist Sit to supine: Supervision        Transfers Overall transfer level: Needs assistance Equipment used: Rolling walker (2 wheels) Transfers: Sit to/from Stand Sit to Stand: Min assist, From elevated surface, +2 safety/equipment           General transfer comment: She took a few forwards and backwards steps, as well as lateral  steps along the EOB using a RW, requiting min assist with cues for walker & R LE placement (R  LE impaired motor control)      Balance Overall balance assessment: Needs assistance     Sitting balance - Comments: static sitting-good  dynamic sitting-fair+     Standing balance-Leahy Scale: Poor           ADL either performed or assessed with clinical judgement   ADL Overall ADL's : Needs assistance/impaired Eating/Feeding: Independent;Sitting Eating/Feeding Details (indicate cue type and reason): based on clinical judgement Grooming: Set up;Sitting Grooming Details (indicate cue type and reason): simulated seated EOB         Upper Body Dressing : Minimal assistance;Sitting   Lower Body Dressing: Moderate assistance                    Vision Patient Visual Report: No change from baseline Vision Assessment?: Vision impaired- to be further tested in functional context Additional Comments: patient reported having chronic blurry vision & having occasional floaters, which she attributes to her prior CVAs            Pertinent Vitals/Pain Pain Assessment Pain Assessment: No/denies pain     Hand Dominance  Right   Extremity/Trunk Assessment Upper Extremity Assessment RUE Deficits / Details: AROM WFL, strength 4-/5 grossly, delayed coordination RUE Coordination: decreased fine motor LUE Deficits / Details: AROM WFL, weaker than R UE from prior CVA, grossly 3+/5, impaired gross and fine motor coordination LUE Coordination: decreased fine motor;decreased gross motor   Lower Extremity Assessment RLE Deficits / Details: impaired motor control RLE Sensation: decreased light touch RLE Coordination: decreased gross motor LLE Sensation: decreased light touch          Cognition Arousal/Alertness: Awake/alert Behavior During Therapy: WFL for tasks assessed/performed        General Comments: Oriented x4, able to follow simple commands without difficulty                Home Living Family/patient expects to be discharged to:: Other (Comment) Living  Arrangements: Alone   Type of Home: Apartment (duplex) Home Access: Stairs to enter Entrance Stairs-Number of Steps: 5 steps down Entrance Stairs-Rails: Right Home Layout: Two level;Bed/bath upstairs Alternate Level Stairs-Number of Steps: 13 Alternate Level Stairs-Rails: Right;Left Bathroom Shower/Tub: Tub/shower unit         Home Equipment: Rollator (4 wheels)      Lives With: Alone    Prior Functioning/Environment Prior Level of Function : Needs assist             Mobility Comments: use of rollator for ambulation (brakes are broken) ADLs Comments: She reported having increased difficulty managing self-care tasks, such as dressing, bathing,  toileting, cooking, and cleaning; she required increased time and effort to perform.        OT Problem List: Decreased strength;Decreased activity tolerance;Impaired balance (sitting and/or standing);Decreased safety awareness;Decreased knowledge of use of DME or AE;Decreased coordination;Impaired vision/perception;Impaired sensation      OT Treatment/Interventions: Self-care/ADL training;Therapeutic exercise;DME and/or AE instruction;Energy conservation;Therapeutic activities;Patient/family education;Neuromuscular education;Balance training;Visual/perceptual remediation/compensation;Cognitive remediation/compensation    OT Goals(Current goals can be found in the care plan section) Acute Rehab OT Goals Patient Stated Goal: to get better OT Goal Formulation: With patient Time For Goal Achievement: 04/22/22 Potential to Achieve Goals: Good  OT Frequency: Min 2X/week       AM-PAC OT "6 Clicks" Daily Activity     Outcome Measure Help from another person eating meals?: None Help  from another person taking care of personal grooming?: A Little Help from another person toileting, which includes using toliet, bedpan, or urinal?: A Lot Help from another person bathing (including washing, rinsing, drying)?: A Lot Help from another  person to put on and taking off regular upper body clothing?: A Little Help from another person to put on and taking off regular lower body clothing?: A Lot 6 Click Score: 16   End of Session Equipment Utilized During Treatment: Gait belt;Rolling walker (2 wheels) Nurse Communication: Mobility status  Activity Tolerance: Patient tolerated treatment well Patient left: in bed;with call bell/phone within reach;with bed alarm set  OT Visit Diagnosis: Unsteadiness on feet (R26.81);Repeated falls (R29.6);Muscle weakness (generalized) (M62.81)                Time: [id number] OT Time Calculation (min): 28 min Charges:  OT General Charges $OT Visit: 1 Visit OT Evaluation $OT Eval Moderate Complexity: 1 Mod OT Treatments $Therapeutic Activity: 8-22 mins    Leota Sauers, OTR/L 04/08/2022, 12:30 PM

## 2022-04-08 NOTE — Progress Notes (Signed)
PROGRESS NOTE  Kaitlyn Gray IPJ:[id number] DOB: 08-Jul-1975   PCP: Patient, No Pcp Per  Patient is from: Home.  Lives alone.  Uses walker at baseline.  DOA: 04/06/2022 LOS: 0  Chief complaints Chief Complaint  Patient presents with   Weakness    Left-side x 2 days     Brief Narrative / Interim history: 46 year old F with PMH of recent CVA, NIDDM-2, HTN, BPD 1, polysubstance use including marijuana, cocaine and tobacco, anemia and noncompliance returning with fall at home and difficulty ambulating.  Patient was hospitalized 10/15-10/20 for acute CVA.  At that time, MRI brain showed cluster of acute infarction at the left frontoparietal vertex, punctate acute infarct in right medial parietal lobe and old midbrain, cerebellar and cerebral infarcts.  CTA head and neck showed significant intracranial and extracranial stenosis.  UDS was positive for cocaine.  Neurology recommended DAPT for 3 months and then Plavix alone, and outpatient follow-up with neuro IR for right mid ICA stenosis.  Therapy recommended SNF but patient decided to leave AMA.  Patient did not continue his antiplatelet after leaving AMA.  On arrival to ED, vitals stable.  Hgb 7.4 (baseline about 8).  Otherwise, CMP and CBC without significant finding.  Glucose 121.  EtOH less than 10.  UDS positive for cocaine.  EKG without acute finding other than previously known CVA.   Therapy recommended SNF.  Subjective: Seen and examined earlier this morning.  No major events overnight of this morning.  No complaints.  Objective: Vitals:   04/07/22 2105 04/07/22 2348 04/08/22 0548 04/08/22 1436  BP: (!) 173/76 (!) 163/79 138/78 (!) 163/89  Pulse: 79  80 88  Resp: 18  18 18   Temp: 98.4 F (36.9 C)  98.6 F (37 C) 98 F (36.7 C)  TempSrc: Oral   Oral  SpO2: 98%  98% 100%  Weight:      Height:        Examination:   GENERAL: No apparent distress.  Nontoxic. HEENT: MMM.  Vision and hearing grossly intact.  NECK:  Supple.  No apparent JVD.  RESP:  No IWOB.  Fair aeration bilaterally. CVS:  RRR. Heart sounds normal.  ABD/GI/GU: BS+. Abd soft, NTND.  MSK/EXT:  Moves extremities. No apparent deformity. No edema.  SKIN: no apparent skin lesion or wound NEURO: Awake and alert. Oriented appropriately.  Some dysarthria.  Bilateral extremity weakness but inconsistent exam.  Motor strength is appears to be better than yesterday. PSYCH: Calm. Normal affect.   Procedures:  None  Microbiology summarized: None  Assessment and plan: Principal Problem:   Stroke (cerebrum) (HCC) Active Problems:   CVA (cerebral vascular accident) ([place])   Type 2 diabetes mellitus ([place])   Hypertension associated with diabetes ([place])   Cocaine use disorder, severe, dependence ([place])   Depression   Bipolar disorder ([place])   Polysubstance abuse ([place])   Microcytic anemia   Noncompliance with medications   Hemiparesis affecting left side as late effect of cerebrovascular accident (CVA) ([place])   Class 1 obesity   Tobacco abuse   Iron deficiency anemia   Fall at home, initial encounter   Ambulatory dysfunction   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  Patient is not able to care for herself.  He lives alone.  No family members close by.  -PT/OT -Fall precaution -Needs placement.  Subacute CVA: hospitalized 10/15-10/20 MRI showed scattered infarcts in bilateral ACA  territory with multifactorial intracranial and extracranial stenosis.  Neurology recommended Plavix and aspirin for 3 months followed by Plavix alone, and outpatient follow-up with neuro IR.  Therapy recommended SNF but patient left AMA.  CT head on admission without acute finding.  UDS suggests ongoing cocaine use.  Extremity weakness seems to be better but inconsistent exam. -Continue Plavix and aspirin for 3 months followed by Plavix alone per previous neurology  recommendation -Outpatient follow-up with neuro IR for right mid ICA 70% stenosis -Continue Crestor -Advised to refrain from cocaine, marijuana and tobacco. -PT/OT-recommended SNF  Extracranial and intracranial arterial stenosis -Management as above  Controlled NIDDM-2 with hypoglycemia and neuropathy: A1c 6.6% on 10/15.  Hypoglycemia likely due to glipizide. Recent Labs  Lab 04/07/22 1735 04/07/22 2102 04/08/22 0741 04/08/22 1132 04/08/22 1653  GLUCAP 124* 96 95 105* 190*  -Discontinued glipizide -Continue SSI-moderate -Add basal if CBG remains elevated -Continue Crestor and gabapentin  Iron deficiency anemia: Patient denies melena or hematochezia.  Hgb down to 7.3 (7 to 8).  Anemia panel with iron deficiency previous hospitalization. Recent Labs    11/24/21 1258 12/12/21 1245 12/26/21 0700 01/27/22 1505 02/28/22 1013 03/30/22 1058 04/01/22 0345 04/06/22 0922 04/07/22 0410 04/08/22 0409  HGB 8.3* 8.5* 8.1* 8.0*  9.2* 7.8* 7.9* 7.7* 7.4* 7.3* 7.1*  -IV ferric gluconate 250 mg x 2 -Transfuse 1 unit -Monitor H&H -May benefit from outpatient GI evaluation  Essential hypertension: BP elevated at times. -Increased losartan to 50 mg daily on 10/23. -Avoid hypotension given intracranial and extracranial arterial stenosis.  Polysubstance use including cocaine, marijuana and tobacco -Counseled to refrain from substance use. -Not interested in nicotine patch.  Depression/bipolar disorder: Stable -Continue home Seroquel  Noncompliance with medication -Counseled.  Obesity Body mass index is 32.01 kg/m.          DVT prophylaxis:  SCDs Start: 04/06/22 1157  Code Status: Full code Family Communication: None at bedside Level of care: Telemetry Status is: Inpatient The patient will remain inpatient because: Subacute CVA, physical deconditioning   Final disposition: SNF Consultants:  None  Sch Meds:  Scheduled Meds:  sodium chloride   Intravenous Once    amLODipine  2.5 mg Oral Daily   aspirin EC  81 mg Oral Daily   clopidogrel  75 mg Oral Daily   [START ON 04/09/2022] vitamin B-12  1,000 mcg Oral Daily   ferrous sulfate  325 mg Oral QODAY   gabapentin  400 mg Oral BID   influenza vac split quadrivalent PF  0.5 mL Intramuscular Tomorrow-1000   insulin aspart  0-15 Units Subcutaneous TID WC   losartan  50 mg Oral Daily   metFORMIN  500 mg Oral BID WC   pneumococcal 20-valent conjugate vaccine  0.5 mL Intramuscular Tomorrow-1000   QUEtiapine  200 mg Oral QHS   rosuvastatin  40 mg Oral Daily   Continuous Infusions:   PRN Meds:.acetaminophen **OR** acetaminophen, nicotine, ondansetron **OR** ondansetron (ZOFRAN) IV, mouth rinse  Antimicrobials: Anti-infectives (From admission, onward)    None        I have personally reviewed the following labs and images: CBC: Recent Labs  Lab 04/06/22 0922 04/07/22 0410 04/08/22 0409  WBC 5.9 5.6 5.7  NEUTROABS 3.6  --   --   HGB 7.4* 7.3* 7.1*  HCT 26.2* 25.1* 25.0*  MCV 73.2* 71.7* 73.1*  PLT 271 282 292   BMP &GFR Recent Labs  Lab 04/06/22 0922 04/08/22 0409  NA 140 135  K 3.9 3.7  CL  113* 108  CO2 22 24  GLUCOSE 121* 105*  BUN 19 23*  CREATININE 0.72 0.79  CALCIUM 8.5* 8.2*  MG  --  1.9  PHOS  --  3.2   Estimated Creatinine Clearance: 91.5 mL/min (by C-G formula based on SCr of 0.79 mg/dL). Liver & Pancreas: Recent Labs  Lab 04/06/22 0922 04/08/22 0409  AST 30  --   ALT 34  --   ALKPHOS 64  --   BILITOT 0.3  --   PROT 7.1  --   ALBUMIN 3.5 2.7*   No results for input(s): "LIPASE", "AMYLASE" in the last 168 hours. No results for input(s): "AMMONIA" in the last 168 hours. Diabetic: No results for input(s): "HGBA1C" in the last 72 hours. Recent Labs  Lab 04/07/22 1735 04/07/22 2102 04/08/22 0741 04/08/22 1132 04/08/22 1653  GLUCAP 124* 96 95 105* 190*   Cardiac Enzymes: No results for input(s): "CKTOTAL", "CKMB", "CKMBINDEX", "TROPONINI" in the last  168 hours. No results for input(s): "PROBNP" in the last 8760 hours. Coagulation Profile: No results for input(s): "INR", "PROTIME" in the last 168 hours. Thyroid Function Tests: No results for input(s): "TSH", "T4TOTAL", "FREET4", "T3FREE", "THYROIDAB" in the last 72 hours. Lipid Profile: No results for input(s): "CHOL", "HDL", "LDLCALC", "TRIG", "CHOLHDL", "LDLDIRECT" in the last 72 hours. Anemia Panel: Recent Labs    04/07/22 1001  VITAMINB12 298   Urine analysis:    Component Value Date/Time   COLORURINE YELLOW 03/30/2022 1039   APPEARANCEUR HAZY (A) 03/30/2022 1039   LABSPEC 1.023 03/30/2022 1039   PHURINE 6.0 03/30/2022 1039   GLUCOSEU 50 (A) 03/30/2022 1039   HGBUR NEGATIVE 03/30/2022 [place] 03/30/2022 [place] 03/30/2022 1039   PROTEINUR NEGATIVE 03/30/2022 1039   UROBILINOGEN 0.2 10/06/2014 0856   NITRITE NEGATIVE 03/30/2022 1039   LEUKOCYTESUR NEGATIVE 03/30/2022 1039   Sepsis Labs: Invalid input(s): "PROCALCITONIN", "LACTICIDVEN"  Microbiology: No results found for this or any previous visit (from the past 240 hour(s)).  Radiology Studies: No results found.    Tara Wich T. [place]  If 7PM-7AM, please contact night-coverage [email or web address] 04/08/2022, 5:09 PM

## 2022-04-08 NOTE — Plan of Care (Signed)
  Problem: Self-Care: Goal: Ability to communicate needs accurately will improve Outcome: Progressing   Problem: Education: Goal: Knowledge of General Education information will improve Description: Including pain rating scale, medication(s)/side effects and non-pharmacologic comfort measures Outcome: Progressing   Problem: Activity: Goal: Risk for activity intolerance will decrease Outcome: Progressing   Problem: Coping: Goal: Level of anxiety will decrease Outcome: Progressing   Problem: Pain Managment: Goal: General experience of comfort will improve Outcome: Progressing   Problem: Safety: Goal: Ability to remain free from injury will improve Outcome: Progressing   Problem: Skin Integrity: Goal: Risk for impaired skin integrity will decrease Outcome: Progressing

## 2022-04-08 NOTE — Congregational Nurse Program (Signed)
IV team to bedside per consult. Pt refused ultrasound use, and refused IV placement at this time. RN and MD aware.

## 2022-04-08 NOTE — Progress Notes (Signed)
Notified on call provider about patient's BP being 173/76. Rechecked patient's BP and it was 163/79. Made on call provider aware.

## 2022-04-09 DIAGNOSIS — E669 Obesity, unspecified: Secondary | ICD-10-CM | POA: Diagnosis not present

## 2022-04-09 DIAGNOSIS — R262 Difficulty in walking, not elsewhere classified: Secondary | ICD-10-CM | POA: Diagnosis not present

## 2022-04-09 DIAGNOSIS — I639 Cerebral infarction, unspecified: Secondary | ICD-10-CM | POA: Diagnosis not present

## 2022-04-09 DIAGNOSIS — F319 Bipolar disorder, unspecified: Secondary | ICD-10-CM | POA: Diagnosis not present

## 2022-04-09 LAB — URINALYSIS, COMPLETE (UACMP) WITH MICROSCOPIC
Bilirubin Urine: NEGATIVE
Glucose, UA: NEGATIVE mg/dL
Hgb urine dipstick: NEGATIVE
Ketones, ur: NEGATIVE mg/dL
Leukocytes,Ua: NEGATIVE
Nitrite: NEGATIVE
Protein, ur: NEGATIVE mg/dL
Specific Gravity, Urine: 1.018 (ref 1.005–1.030)
pH: 6 (ref 5.0–8.0)

## 2022-04-09 LAB — BPAM RBC
Blood Product Expiration Date: 202311232359
ISSUE DATE / TIME: 202310242157
Unit Type and Rh: 5100

## 2022-04-09 LAB — TYPE AND SCREEN
ABO/RH(D): O POS
Antibody Screen: NEGATIVE
Unit division: 0

## 2022-04-09 LAB — GLUCOSE, CAPILLARY
Glucose-Capillary: 113 mg/dL — ABNORMAL HIGH (ref 70–99)
Glucose-Capillary: 282 mg/dL — ABNORMAL HIGH (ref 70–99)
Glucose-Capillary: 137 mg/dL — ABNORMAL HIGH (ref 70–99)
Glucose-Capillary: 131 mg/dL — ABNORMAL HIGH (ref 70–99)

## 2022-04-09 MED ORDER — BISACODYL 10 MG RE SUPP
10.0000 mg | Freq: Once | RECTAL | Status: DC
  Filled 2022-04-09: qty 1

## 2022-04-09 MED ORDER — AMLODIPINE BESYLATE 5 MG PO TABS
5.0000 mg | ORAL_TABLET | Freq: Every day | ORAL | Status: DC
  Administered 2022-04-09 – 2022-04-11 (×3): 5 mg via ORAL
  Filled 2022-04-09 (×3): qty 1

## 2022-04-09 MED ORDER — DOCUSATE NICU RECTAL SYRINGE 10 MG/ML: 1.0000 mL | Freq: Once | RECTAL | Status: DC

## 2022-04-09 NOTE — [place] FL2 (Signed)
[place] MEDICAID FL2 LEVEL OF CARE SCREENING TOOL     IDENTIFICATION  Patient Name: Kaitlyn Gray Birthdate: July 18, 1975 Sex: female Admission Date (Current Location): 04/06/2022  [place] and [place] Number:  Haynes Bast [id number] K Facility and Address:  [facility]  [address], [place] [address]      Provider Number: [id number]  Attending Physician Name and Address:  Almon Hercules, MD  Relative Name and Phone Number:  Clayborn Bigness    Current Level of Care: Hospital Recommended Level of Care: Skilled Nursing Facility Prior Approval Number:    Date Approved/Denied:   PASRR Number: Pending review  Discharge Plan: SNF    Current Diagnoses: Patient Active Problem List   Diagnosis Date Noted   Iron deficiency anemia 04/07/2022   Fall at home, initial encounter 04/07/2022   Ambulatory dysfunction 04/07/2022   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 04/07/2022   Stroke (cerebrum) (HCC) 04/06/2022   Class 1 obesity 04/06/2022   Tobacco abuse 04/06/2022   Abdominal pain 04/04/2022   History of stroke 03/31/2022   Hemiparesis affecting left side as late effect of cerebrovascular accident (CVA) (HCC) 03/31/2022   Opiate use    Microcytic anemia 03/30/2022   Chronic anemia    Noncompliance with medications    CVA (cerebral vascular accident) (HCC) 09/06/2020   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   History of cervical fracture 12/24/2017   Type 2 diabetes mellitus (HCC) 12/06/2017   Hypertension associated with diabetes (HCC) 12/06/2017   Bipolar disorder (HCC) 07/19/2017   Polysubstance abuse (HCC) 07/19/2017   Cocaine use disorder, severe, dependence (HCC) 03/24/2015   Depression 03/24/2015   Cannabis abuse 03/23/2013    Orientation RESPIRATION BLADDER Height & Weight     Self, Time, Situation, Place  Normal Continent Weight: 84.6 kg Height:  5\' 4"  (162.6 cm)  BEHAVIORAL SYMPTOMS/MOOD  NEUROLOGICAL BOWEL NUTRITION STATUS      Continent Diet (regular diet, thin fluids)  AMBULATORY STATUS COMMUNICATION OF NEEDS Skin   Extensive Assist Verbally Normal                       Personal Care Assistance Level of Assistance  Bathing, Feeding, Dressing Bathing Assistance: Maximum assistance Feeding assistance: Limited assistance       Functional Limitations Info  Sight, Hearing, Speech Sight Info: Adequate Hearing Info: Adequate Speech Info: Impaired (dysarthria)    SPECIAL CARE FACTORS FREQUENCY  PT (By licensed PT), OT (By licensed OT)     PT Frequency: 5x per week OT Frequency: 5x per week            Contractures Contractures Info: Not present    Additional Factors Info  Code Status, Allergies Code Status Info: Full Allergies Info: Hydrocodone, Latex Psychotropic Info: Seroquel 200mg  daily at bedtime         Current Medications (04/09/2022):  This is the current hospital active medication list Current Facility-Administered Medications  Medication Dose Route Frequency Provider Last Rate Last Admin   0.9 %  sodium chloride infusion (Manually program via Guardrails IV Fluids)   Intravenous Once , MD       acetaminophen (TYLENOL) tablet 650 mg  650 mg Oral Q6H PRN 04/11/2022, MD       Or   acetaminophen (TYLENOL) suppository 650 mg  650 mg Rectal Q6H PRN Almon Hercules, MD       alum & mag hydroxide-simeth (MAALOX/MYLANTA) 200-200-20 MG/5ML  suspension 30 mL  30 mL Oral Q4H PRN Wendee Beavers T, MD   30 mL at 04/08/22 2240   amLODipine (NORVASC) tablet 5 mg  5 mg Oral Daily Wendee Beavers T, MD   5 mg at 04/09/22 0959   aspirin EC tablet 81 mg  81 mg Oral Daily Reubin Milan, MD   81 mg at 04/09/22 0959   clopidogrel (PLAVIX) tablet 75 mg  75 mg Oral Daily Reubin Milan, MD   75 mg at 04/09/22 7681   cyanocobalamin (VITAMIN B12) tablet 1,000 mcg  1,000 mcg Oral Daily Wendee Beavers T, MD   1,000 mcg at 04/09/22 1572    ferrous sulfate tablet 325 mg  325 mg Oral Glenna Durand, MD   325 mg at 04/08/22 1131   gabapentin (NEURONTIN) capsule 400 mg  400 mg Oral BID Reubin Milan, MD   400 mg at 04/09/22 6203   influenza vac split quadrivalent PF (FLUARIX) injection 0.5 mL  0.5 mL Intramuscular Tomorrow-1000 Reubin Milan, MD       insulin aspart (novoLOG) injection 0-15 Units  0-15 Units Subcutaneous TID WC Reubin Milan, MD   3 Units at 04/08/22 1735   losartan (COZAAR) tablet 50 mg  50 mg Oral Daily Wendee Beavers T, MD   50 mg at 04/09/22 0959   metFORMIN (GLUCOPHAGE-XR) 24 hr tablet 500 mg  500 mg Oral BID WC Reubin Milan, MD   500 mg at 04/09/22 1001   nicotine (NICODERM CQ - dosed in mg/24 hours) patch 14 mg  14 mg Transdermal Daily PRN Reubin Milan, MD       ondansetron [facility]) tablet 4 mg  4 mg Oral Q6H PRN Reubin Milan, MD       Or   ondansetron [facility]) injection 4 mg  4 mg Intravenous Q6H PRN Reubin Milan, MD       Oral care mouth rinse  15 mL Mouth Rinse PRN Reubin Milan, MD       pneumococcal 20-valent conjugate vaccine (PREVNAR 20) injection 0.5 mL  0.5 mL Intramuscular Tomorrow-1000 Reubin Milan, MD       QUEtiapine (SEROQUEL) tablet 200 mg  200 mg Oral QHS Reubin Milan, MD   200 mg at 04/08/22 2148   rosuvastatin (CRESTOR) tablet 40 mg  40 mg Oral Daily Reubin Milan, MD   40 mg at 04/09/22 5597     Discharge Medications: Please see discharge summary for a list of discharge medications.  Relevant Imaging Results:  Relevant Lab Results:   Additional Information SSN: [id number]  Roseanne Kaufman, RN

## 2022-04-09 NOTE — TOC Progression Note (Addendum)
Transition of Care [facility]) - Progression Note    Patient Details  Name: Kaitlyn Gray MRN: [id number] Date of Birth: 01/31/1976  Transition of [facility]) CM/SW [place], RN Phone Number: 04/09/2022, 11:10 AM  Clinical Narrative:   Received TOC consult for medication assistance, PCP, HH/DME, SA counseling. Patient has medical insurance and not eligible for [facility] program. Will discuss with patient PCP, based on her insurance a PCP is auto assigned.  PT recommended short term SNF, patient agrees to SNF placement and does not have any preferences. Patient's PASRR is pending, will need additional clinical documentation. Notified MD for review and signature of FL2.  This RNCM faxed letter for missed court date 04/08/22 to patient's attorney [facility] [phone], received successful transmission notice.   TOC will continue to follow - 11:35am faxed out for short term SNf placement. Faxed additional clinical to [place] [facility], awaiting outcome of PASRR review.   TOC will continue to follow.   Expected Discharge Plan: [place] Barriers to Discharge: Continued Medical Work up, Programmer, applications (PASRR), Inadequate or no insurance  Expected Discharge Plan and Services Expected Discharge Plan: [place] In-house Referral: NA Discharge Planning Services: CM Consult Post Acute Care Choice: [place] Living arrangements for the past 2 months: Single Family Home                           HH Arranged: NA [place] Agency: NA         Social Determinants of Health (SDOH) Interventions    Readmission Risk Interventions     No data to display

## 2022-04-09 NOTE — Progress Notes (Signed)
PROGRESS NOTE  Kaitlyn Gray HGD:[id number] DOB: 08-Sep-1975   PCP: Patient, No Pcp Per  Patient is from: Home.  Lives alone.  Uses walker at baseline.  DOA: 04/06/2022 LOS: 1  Chief complaints Chief Complaint  Patient presents with   Weakness    Left-side x 2 days     Brief Narrative / Interim history: 46 year old F with PMH of recent CVA, NIDDM-2, HTN, BPD 1, polysubstance use including marijuana, cocaine and tobacco, anemia and noncompliance returning with fall at home and difficulty ambulating.  Patient was hospitalized 10/15-10/20 for acute CVA.  At that time, MRI brain showed cluster of acute infarction at the left frontoparietal vertex, punctate acute infarct in right medial parietal lobe and old midbrain, cerebellar and cerebral infarcts.  CTA head and neck showed significant intracranial and extracranial stenosis.  UDS was positive for cocaine.  Neurology recommended DAPT for 3 months and then Plavix alone, and outpatient follow-up with neuro IR for right mid ICA stenosis.  Therapy recommended SNF but patient decided to leave AMA.  Patient did not continue his antiplatelet after leaving AMA.  On arrival to ED, vitals stable.  Hgb 7.4 (baseline about 8).  Otherwise, CMP and CBC without significant finding.  Glucose 121.  EtOH less than 10.  UDS positive for cocaine.  EKG without acute finding other than previously known CVA.   Therapy recommended SNF.  Waiting on insurance authorization and SNF bed  Subjective: Seen and examined earlier this morning.  Reportedly had some nausea and abdominal pain after she started blood transfusion.  Blood transfusion was discontinued.  She was also given Dulcolax suppository earlier in the night.  Now feels well.  No complaints.  Objective: Vitals:   04/08/22 2204 04/08/22 2221 04/09/22 0704 04/09/22 1139  BP: (!) 186/93 (!) 178/102 (!) 157/86 (!) 148/73  Pulse: 83 86 77 89  Resp: 20 20 19 19   Temp: 97.8 F (36.6 C) 98 F (36.7 C)  98.3 F (36.8 C) 97.6 F (36.4 C)  TempSrc: Oral Oral Oral Oral  SpO2: 100% 99% 98% 100%  Weight:      Height:        Examination:  GENERAL: No apparent distress.  Nontoxic. HEENT: MMM.  Vision and hearing grossly intact.  NECK: Supple.  No apparent JVD.  RESP:  No IWOB.  Fair aeration bilaterally. CVS:  RRR. Heart sounds normal.  ABD/GI/GU: BS+. Abd soft, NTND.  MSK/EXT:  Moves extremities. No apparent deformity. No edema.  SKIN: no apparent skin lesion or wound NEURO: Awake and alert. Oriented appropriately.  Some dysarthria.  Bilateral extremity weakness but inconsistent exam.  Motor strength seems to be better.  PSYCH: Calm. Normal affect.   Procedures:  None  Microbiology summarized: None  Assessment and plan: Principal Problem:   Stroke (cerebrum) (HCC) Active Problems:   CVA (cerebral vascular accident) (HCC)   Type 2 diabetes mellitus (HCC)   Hypertension associated with diabetes (HCC)   Cocaine use disorder, severe, dependence (HCC)   Depression   Bipolar disorder (HCC)   Polysubstance abuse (HCC)   Microcytic anemia   Noncompliance with medications   Hemiparesis affecting left side as late effect of cerebrovascular accident (CVA) (HCC)   Class 1 obesity   Tobacco abuse   Iron deficiency anemia   Fall at home, initial encounter   Ambulatory dysfunction   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  Patient is  not able to care for herself.  He lives alone.  No family members close by.  -PT/OT-rec and at [facility] -Fall precaution  Subacute CVA: hospitalized 10/15-10/20 MRI showed scattered infarcts in bilateral ACA territory with multifactorial intracranial and extracranial stenosis.  Neurology recommended Plavix and aspirin for 3 months followed by Plavix alone, and outpatient follow-up with neuro IR.  Therapy recommended SNF but patient left AMA.  CT head on  admission without acute finding.  UDS suggests ongoing cocaine use.  Extremity weakness seems to be better but inconsistent exam. -Continue Plavix and aspirin for 3 months followed by Plavix alone per previous neurology recommendation -Outpatient follow-up with neuro IR for right mid ICA 70% stenosis -Continue Crestor -Advised to refrain from cocaine, marijuana and tobacco. -PT/OT-recommended SNF  Extracranial and intracranial arterial stenosis -Management as above  Controlled NIDDM-2 with hypoglycemia and neuropathy: A1c 6.6% on 10/15.  Hypoglycemia likely due to glipizide. Recent Labs  Lab 04/08/22 1132 04/08/22 1653 04/08/22 2114 04/09/22 0732 04/09/22 1130  GLUCAP 105* 190* 120* 113* 282*  -Discontinued glipizide -Continue SSI-moderate -Add basal if CBG consistently elevated. -Continue Crestor and gabapentin  Iron deficiency anemia: Patient denies melena or hematochezia.  Hgb down to 7.3 (7 to 8).  Anemia panel with iron deficiency previous hospitalization.  Received IV ferric gluconate x2.  Some nausea and abdominal pain after she started blood transfusion.  Recent Labs    11/24/21 1258 12/12/21 1245 12/26/21 0700 01/27/22 1505 02/28/22 1013 03/30/22 1058 04/01/22 0345 04/06/22 0922 04/07/22 0410 04/08/22 0409  HGB 8.3* 8.5* 8.1* 8.0*  9.2* 7.8* 7.9* 7.7* 7.4* 7.3* 7.1*  -Received IV ferric gluconate 250 mg x 2 -Monitor H&H -May benefit from outpatient GI evaluation  Essential hypertension: BP elevated at times. -Increased losartan to 50 mg daily on 10/23. -Increase amlodipine to 5 mg daily -Avoid hypotension given intracranial and extracranial arterial stenosis.  Polysubstance use including cocaine, marijuana and tobacco -Counseled to refrain from substance use. -Not interested in nicotine patch.  Depression/bipolar disorder: Stable -Continue home Seroquel  Noncompliance with medication -Counseled.  Obesity Body mass index is 32.01 kg/m.           DVT prophylaxis:  SCDs Start: 04/06/22 1157  Code Status: Full code Family Communication: None at bedside Level of care: Telemetry Status is: Inpatient The patient will remain inpatient because: Subacute CVA, physical deconditioning and inability to care for herself at home   Final disposition: SNF Consultants:  None  Sch Meds:  Scheduled Meds:  sodium chloride   Intravenous Once   amLODipine  5 mg Oral Daily   aspirin EC  81 mg Oral Daily   clopidogrel  75 mg Oral Daily   vitamin B-12  1,000 mcg Oral Daily   ferrous sulfate  325 mg Oral QODAY   gabapentin  400 mg Oral BID   influenza vac split quadrivalent PF  0.5 mL Intramuscular Tomorrow-1000   insulin aspart  0-15 Units Subcutaneous TID WC   losartan  50 mg Oral Daily   metFORMIN  500 mg Oral BID WC   pneumococcal 20-valent conjugate vaccine  0.5 mL Intramuscular Tomorrow-1000   QUEtiapine  200 mg Oral QHS   rosuvastatin  40 mg Oral Daily   Continuous Infusions:   PRN Meds:.acetaminophen **OR** acetaminophen, alum & mag hydroxide-simeth, nicotine, ondansetron **OR** ondansetron (ZOFRAN) IV, mouth rinse  Antimicrobials: Anti-infectives (From admission, onward)    None        I have personally reviewed the following labs and images: CBC:  Recent Labs  Lab 04/06/22 0922 04/07/22 0410 04/08/22 0409  WBC 5.9 5.6 5.7  NEUTROABS 3.6  --   --   HGB 7.4* 7.3* 7.1*  HCT 26.2* 25.1* 25.0*  MCV 73.2* 71.7* 73.1*  PLT 271 282 292   BMP &GFR Recent Labs  Lab 04/06/22 0922 04/08/22 0409  NA 140 135  K 3.9 3.7  CL 113* 108  CO2 22 24  GLUCOSE 121* 105*  BUN 19 23*  CREATININE 0.72 0.79  CALCIUM 8.5* 8.2*  MG  --  1.9  PHOS  --  3.2   Estimated Creatinine Clearance: 91.5 mL/min (by C-G formula based on SCr of 0.79 mg/dL). Liver & Pancreas: Recent Labs  Lab 04/06/22 0922 04/08/22 0409  AST 30  --   ALT 34  --   ALKPHOS 64  --   BILITOT 0.3  --   PROT 7.1  --   ALBUMIN 3.5 2.7*   No results  for input(s): "LIPASE", "AMYLASE" in the last 168 hours. No results for input(s): "AMMONIA" in the last 168 hours. Diabetic: No results for input(s): "HGBA1C" in the last 72 hours. Recent Labs  Lab 04/08/22 1132 04/08/22 1653 04/08/22 2114 04/09/22 0732 04/09/22 1130  GLUCAP 105* 190* 120* 113* 282*   Cardiac Enzymes: No results for input(s): "CKTOTAL", "CKMB", "CKMBINDEX", "TROPONINI" in the last 168 hours. No results for input(s): "PROBNP" in the last 8760 hours. Coagulation Profile: No results for input(s): "INR", "PROTIME" in the last 168 hours. Thyroid Function Tests: No results for input(s): "TSH", "T4TOTAL", "FREET4", "T3FREE", "THYROIDAB" in the last 72 hours. Lipid Profile: No results for input(s): "CHOL", "HDL", "LDLCALC", "TRIG", "CHOLHDL", "LDLDIRECT" in the last 72 hours. Anemia Panel: Recent Labs    04/07/22 1001  VITAMINB12 298   Urine analysis:    Component Value Date/Time   COLORURINE YELLOW 03/30/2022 1039   APPEARANCEUR HAZY (A) 03/30/2022 1039   LABSPEC 1.023 03/30/2022 1039   PHURINE 6.0 03/30/2022 1039   GLUCOSEU 50 (A) 03/30/2022 1039   HGBUR NEGATIVE 03/30/2022 [place] 03/30/2022 [place] 03/30/2022 1039   PROTEINUR NEGATIVE 03/30/2022 1039   UROBILINOGEN 0.2 10/06/2014 0856   NITRITE NEGATIVE 03/30/2022 1039   LEUKOCYTESUR NEGATIVE 03/30/2022 1039   Sepsis Labs: Invalid input(s): "PROCALCITONIN", "LACTICIDVEN"  Microbiology: No results found for this or any previous visit (from the past 240 hour(s)).  Radiology Studies: No results found.    [facility]  If 7PM-7AM, please contact night-coverage [email or web address] 04/09/2022, 3:19 PM

## 2022-04-09 NOTE — Progress Notes (Signed)
Physical Therapy Treatment Patient Details Name: Kaitlyn Gray MRN: [id number] DOB: 07-Sep-1975 Today's Date: 04/09/2022   History of Present Illness Kaitlyn Gray is a 46 y.o. female with medical history significant of acute encephalopathy adjustment disorder, bipolar 1 disorder, cannabis use disorder, chronic anemia, cocaine use disorder, history of cervical fracture, hyperosmolar nonketotic state, type 2 diabetes, hypertension, left-sided weakness, dysarthria due to previous stroke who was recently admitted due to ischemic stroke, was supposed to be discharged to rehab but signed AMA and went home instead.  She has not taking her DAPT recommended by neurology since then.  She has had 3 or 4 falls while trying to ambulate at home.  She has no one else with her and has significant difficulty getting off the floor.  Denied loss of consciousness or other injuries during falls.  No fever, chills or night sweats. No sore throat, rhinorrhea, dyspnea, wheezing or hemoptysis.  No chest pain, palpitations, diaphoresis, PND, orthopnea or pitting edema of the lower extremities.  No appetite changes, abdominal pain, diarrhea, constipation, melena or hematochezia.  No flank pain, dysuria, frequency or hematuria.  No polyuria, polydipsia, polyphagia or blurred vision.    PT Comments    Pt AxO x 3 pleasant and cooperative.  Assisted OOB to [facility] then amb in hallway.  General transfer comment: 75% VC's on proper hand placement as well as safety with turns.  Assisted with [facility] transfer.  Unsteady with turns.  General Gait Details: Pt present with alternating gait with inconsistant stepping, excessive step length, requiring VC's to correct walker to self distance.  HIGH FALL RISK. Pt will need ST Rehab at SNF to address mobility and functional decline prior to safely returning home.   Recommendations for follow up therapy are one component of a multi-disciplinary discharge planning process, led by the attending  physician.  Recommendations may be updated based on patient status, additional functional criteria and insurance authorization.  Follow Up Recommendations  Skilled nursing-short term rehab (<3 hours/day) Can patient physically be transported by private vehicle: No   Assistance Recommended at Discharge Frequent or constant Supervision/Assistance  Patient can return home with the following A lot of help with walking and/or transfers;A lot of help with bathing/dressing/bathroom;Assistance with cooking/housework;Direct supervision/assist for medications management;Direct supervision/assist for financial management;Assist for transportation;Help with stairs or ramp for entrance   Equipment Recommendations       Recommendations for Other Services       Precautions / Restrictions Precautions Precautions: Fall Restrictions Weight Bearing Restrictions: No Other Position/Activity Restrictions: inconsistant L Hemiparesis     Mobility  Bed Mobility Overal bed mobility: Needs Assistance Bed Mobility: Supine to Sit, Sit to Supine     Supine to sit: Min assist Sit to supine: Min assist   General bed mobility comments: increased time and use of rails.    Transfers Overall transfer level: Needs assistance Equipment used: Rolling walker (2 wheels) Transfers: Sit to/from Stand Sit to Stand: Min assist, From elevated surface, +2 safety/equipment   Step pivot transfers: Min assist, +2 safety/equipment       General transfer comment: 75% VC's on proper hand placement as well as safety with turns.  Assisted with [facility] transfer.  Unsteady with turns.    Ambulation/Gait Ambulation/Gait assistance: Min assist, Mod assist Gait Distance (Feet): 50 Feet Assistive device: Rolling walker (2 wheels) Gait Pattern/deviations: Step-through pattern, Trunk flexed, Ataxic Gait velocity: decreased     General Gait Details: Pt present with alternating gait with inconsistant stepping, excessive step  length, requiring VC's to correct walker to self distance.  HIGH FALL RISK.   Stairs             Wheelchair Mobility    Modified Rankin (Stroke Patients Only)       Balance                                            Cognition Arousal/Alertness: Awake/alert Behavior During Therapy: WFL for tasks assessed/performed                                   General Comments: AxO x 3 pleasant/cooperative        Exercises      General Comments        Pertinent Vitals/Pain Pain Assessment Pain Assessment: No/denies pain    Home Living                          Prior Function            PT Goals (current goals can now be found in the care plan section) Progress towards PT goals: Progressing toward goals    Frequency    Min 3X/week      PT Plan Current plan remains appropriate    Co-evaluation              AM-PAC PT "6 Clicks" Mobility   Outcome Measure  Help needed turning from your back to your side while in a flat bed without using bedrails?: A Lot Help needed moving from lying on your back to sitting on the side of a flat bed without using bedrails?: A Lot Help needed moving to and from a bed to a chair (including a wheelchair)?: A Lot Help needed standing up from a chair using your arms (e.g., wheelchair or bedside chair)?: A Lot Help needed to walk in hospital room?: A Lot Help needed climbing 3-5 steps with a railing? : Total 6 Click Score: 11    End of Session Equipment Utilized During Treatment: Gait belt Activity Tolerance: Patient tolerated treatment well;Patient limited by lethargy;Patient limited by fatigue Patient left: in bed;with call bell/phone within reach;with bed alarm set Nurse Communication: Mobility status PT Visit Diagnosis: Unsteadiness on feet (R26.81);Other abnormalities of gait and mobility (R26.89);Muscle weakness (generalized) (M62.81);History of falling (Z91.81);Hemiplegia  and hemiparesis;Pain Hemiplegia - dominant/non-dominant: Dominant Hemiplegia - caused by: Cerebral infarction     Time: 1505-1530 PT Time Calculation (min) (ACUTE ONLY): 25 min  Charges:  $Gait Training: 8-22 mins $Therapeutic Activity: 8-22 mins                     Rica Koyanagi  PTA [place] Office M-F          [phone] Weekend pager [phone]

## 2022-04-09 NOTE — Plan of Care (Signed)
  Problem: Self-Care: Goal: Ability to communicate needs accurately will improve Outcome: Progressing   Problem: Education: Goal: Knowledge of General Education information will improve Description: Including pain rating scale, medication(s)/side effects and non-pharmacologic comfort measures Outcome: Progressing   Problem: Activity: Goal: Risk for activity intolerance will decrease Outcome: Progressing   Problem: Coping: Goal: Level of anxiety will decrease Outcome: Progressing   Problem: Pain Managment: Goal: General experience of comfort will improve Outcome: Progressing   Problem: Safety: Goal: Ability to remain free from injury will improve Outcome: Progressing   Problem: Skin Integrity: Goal: Risk for impaired skin integrity will decrease Outcome: Progressing   

## 2022-04-09 NOTE — Progress Notes (Signed)
Patient has had frequent loose stools throughout the night after RN gave patient Dulcolax Suppository for constipation. The urine for the blood transfusion reaction was still unable to obtain due to patient's loose stools. Will pass along to day shift.

## 2022-04-09 NOTE — Progress Notes (Signed)
After 15 minutes of administering the 1 unit of PRBC, the patient started to feel nauseous and was complaining of abdominal pain all over. RN stopped blood transfusion and notified Charge RN, Rapid Response RN, on call provider, and Blood Bank. Patient told RN after feeling nauseous and having abdominal pain that she did not want any more of the blood and felt she did not need it. On call provider was also made aware of patient's elevated BP of 186/93 prior to blood administration and BP of 178/102 15 minutes after blood administration, which was when patient starting having the blood transfusion reaction. On call did put in an order for IV 0.9% NaCl at 40 mL/hr for 5 hours. Phlebotomy came to collect labs due to patient having the transfusion reaction, but patient refused. On call provider was made aware. At this time, unable to collect urine for blood transfusion reaction due to patient having loose stool.

## 2022-04-10 DIAGNOSIS — I639 Cerebral infarction, unspecified: Secondary | ICD-10-CM | POA: Diagnosis not present

## 2022-04-10 DIAGNOSIS — R262 Difficulty in walking, not elsewhere classified: Secondary | ICD-10-CM | POA: Diagnosis not present

## 2022-04-10 DIAGNOSIS — F319 Bipolar disorder, unspecified: Secondary | ICD-10-CM | POA: Diagnosis not present

## 2022-04-10 DIAGNOSIS — E669 Obesity, unspecified: Secondary | ICD-10-CM | POA: Diagnosis not present

## 2022-04-10 LAB — GLUCOSE, CAPILLARY
Glucose-Capillary: 127 mg/dL — ABNORMAL HIGH (ref 70–99)
Glucose-Capillary: 439 mg/dL — ABNORMAL HIGH (ref 70–99)
Glucose-Capillary: 87 mg/dL (ref 70–99)
Glucose-Capillary: 97 mg/dL (ref 70–99)

## 2022-04-10 LAB — CBC
HCT: 26.7 % — ABNORMAL LOW (ref 36.0–46.0)
Hemoglobin: 7.6 g/dL — ABNORMAL LOW (ref 12.0–15.0)
MCH: 21.5 pg — ABNORMAL LOW (ref 26.0–34.0)
MCHC: 28.5 g/dL — ABNORMAL LOW (ref 30.0–36.0)
MCV: 75.4 fL — ABNORMAL LOW (ref 80.0–100.0)
Platelets: 322 10*3/uL (ref 150–400)
RBC: 3.54 MIL/uL — ABNORMAL LOW (ref 3.87–5.11)
RDW: 20.1 % — ABNORMAL HIGH (ref 11.5–15.5)
WBC: 7.1 10*3/uL (ref 4.0–10.5)
nRBC: 0 % (ref 0.0–0.2)

## 2022-04-10 MED ORDER — INSULIN GLARGINE-YFGN 100 UNIT/ML ~~LOC~~ SOLN
10.0000 [IU] | Freq: Every day | SUBCUTANEOUS | Status: DC
  Administered 2022-04-10 – 2022-04-11 (×2): 10 [IU] via SUBCUTANEOUS
  Filled 2022-04-10 (×2): qty 0.1

## 2022-04-10 MED ORDER — BISACODYL 5 MG PO TBEC
5.0000 mg | DELAYED_RELEASE_TABLET | Freq: Every day | ORAL | Status: DC | PRN
  Administered 2022-04-10: 5 mg via ORAL
  Filled 2022-04-10: qty 1

## 2022-04-10 MED ORDER — INSULIN ASPART 100 UNIT/ML IJ SOLN
4.0000 [IU] | Freq: Three times a day (TID) | INTRAMUSCULAR | Status: DC
  Administered 2022-04-10: 4 [IU] via SUBCUTANEOUS

## 2022-04-10 NOTE — Progress Notes (Signed)
PROGRESS NOTE  Kaitlyn Gray:[id number] DOB: 06/26/1975   PCP: Patient, No Pcp Per  Patient is from: Home.  Lives alone.  Uses walker at baseline.  DOA: 04/06/2022 LOS: 2  Chief complaints Chief Complaint  Patient presents with   Weakness    Left-side x 2 days     Brief Narrative / Interim history: 46 year old F with PMH of recent CVA, NIDDM-2, HTN, BPD 1, polysubstance use including marijuana, cocaine and tobacco, anemia and noncompliance returning with fall at home and difficulty ambulating.  Patient was hospitalized 10/15-10/20 for acute CVA.  At that time, MRI brain showed cluster of acute infarction at the left frontoparietal vertex, punctate acute infarct in right medial parietal lobe and old midbrain, cerebellar and cerebral infarcts.  CTA head and neck showed significant intracranial and extracranial stenosis.  UDS was positive for cocaine.  Neurology recommended DAPT for 3 months and then Plavix alone, and outpatient follow-up with neuro IR for right mid ICA stenosis.  Therapy recommended SNF but patient decided to leave AMA.  Patient did not continue his antiplatelet after leaving AMA.  On arrival to ED, vitals stable.  Hgb 7.4 (baseline about 8).  Otherwise, CMP and CBC without significant finding.  Glucose 121.  EtOH less than 10.  UDS positive for cocaine.  EKG without acute finding other than previously known CVA.   Therapy recommended SNF.  Waiting on insurance authorization and SNF bed  Subjective: Seen and examined earlier this morning.  No major events overnight of this morning.  Reports some lower back pain.  She has chronic back pain.  Asking for rectal suppositories for constipation.   Objective: Vitals:   04/09/22 2111 04/10/22 0554 04/10/22 0610 04/10/22 0726  BP: (!) 179/83 (!) 174/77 (!) 152/80 (!) 147/78  Pulse: 81  75 73  Resp: 18 19  18   Temp: 97.6 F (36.4 C) 97.8 F (36.6 C)  97.9 F (36.6 C)  TempSrc:  Oral  Axillary  SpO2: 99% 100%  100%   Weight:      Height:        Examination: GENERAL: Sitting on the side of the bed eating breakfast. HEENT: MMM.  Vision and hearing grossly intact.  NECK: Supple.  No apparent JVD.  RESP:  No IWOB.  Fair aeration bilaterally. CVS:  RRR. Heart sounds normal.  ABD/GI/GU: BS+. Abd soft, NTND.  MSK/EXT:  Moves extremities. No apparent deformity. No edema.  SKIN: no apparent skin lesion or wound NEURO: Awake and alert. Oriented appropriately.  No dysarthria.  Bilateral extremity weakness but inconsistent exam.  Motor strength seems to be better. PSYCH: Calm. Normal affect.   Procedures:  None  Microbiology summarized: None  Assessment and plan: Principal Problem:   Stroke (cerebrum) (HCC) Active Problems:   CVA (cerebral vascular accident) ([place])   Type 2 diabetes mellitus ([place])   Hypertension associated with diabetes ([place])   Cocaine use disorder, severe, dependence ([place])   Depression   Bipolar disorder ([place])   Polysubstance abuse ([place])   Microcytic anemia   Noncompliance with medications   Hemiparesis affecting left side as late effect of cerebrovascular accident (CVA) ([place])   Class 1 obesity   Tobacco abuse   Iron deficiency anemia   Fall at home, initial encounter   Ambulatory dysfunction   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  Patient is not able to care for herself.  She lives  alone.  No family members close by.  -PT/OT-rec and at [facility] -Fall precaution  Subacute CVA: hospitalized 10/15-10/20 MRI showed scattered infarcts in bilateral ACA territory with multifactorial intracranial and extracranial stenosis.  Neurology recommended Plavix and aspirin for 3 months followed by Plavix alone, and outpatient follow-up with neuro IR.  Therapy recommended SNF but patient left AMA.  CT head on admission without acute finding.  UDS suggests ongoing cocaine use.  Extremity weakness  seems to be better but inconsistent exam. -Continue Plavix and aspirin for 3 months followed by Plavix alone per previous neurology recommendation -Outpatient follow-up with neuro IR for right mid ICA 70% stenosis -Continue Crestor -Advised to refrain from cocaine, marijuana and tobacco. -PT/OT-recommended SNF  Extracranial and intracranial arterial stenosis -Management as above  Controlled NIDDM-2 with hypoglycemia, hyperglycemia and neuropathy: A1c 6.6% on 10/15.  Recent Labs  Lab 04/09/22 1130 04/09/22 1624 04/09/22 2107 04/10/22 0800 04/10/22 1221  GLUCAP 282* 137* 131* 127* 439*  -Discontinued glipizide due to hypoglycemia. -Continue SSI-moderate -Add basal insulin 10 units daily -Add NovoLog 4 units 3 times daily with meals -Change diet to carb modified -Continue Crestor and gabapentin  Iron deficiency anemia: Patient denies melena or hematochezia.  Hgb down to 7.3 (7 to 8).  Anemia panel with iron deficiency previous hospitalization.  Received IV ferric gluconate x2.  Some nausea and abdominal pain after she started blood transfusion.  Recent Labs    12/12/21 1245 12/26/21 0700 01/27/22 1505 02/28/22 1013 03/30/22 1058 04/01/22 0345 04/06/22 0922 04/07/22 0410 04/08/22 0409 04/10/22 0358  HGB 8.5* 8.1* 8.0*  9.2* 7.8* 7.9* 7.7* 7.4* 7.3* 7.1* 7.6*  -Received IV ferric gluconate 250 mg x 2 -Monitor H&H -May benefit from outpatient GI evaluation  Essential hypertension: BP elevated at times. -Increased losartan to 50 mg daily on 10/23. -Increase amlodipine to 5 mg daily on 10/25 -Avoid hypotension given intracranial and extracranial arterial stenosis.  Polysubstance use including cocaine, marijuana and tobacco -Counseled to refrain from substance use. -Not interested in nicotine patch.  Depression/bipolar disorder: Stable -Continue home Seroquel  Noncompliance with medication -Counseled.  Chronic back pain -Tylenol as needed  Obesity Body mass  index is 32.01 kg/m.          DVT prophylaxis:  SCDs Start: 04/06/22 1157  Code Status: Full code Family Communication: None at bedside Level of care: Telemetry Status is: Inpatient The patient will remain inpatient because: Safe disposition/SNF   Final disposition: SNF Consultants:  None  Sch Meds:  Scheduled Meds:  sodium chloride   Intravenous Once   amLODipine  5 mg Oral Daily   aspirin EC  81 mg Oral Daily   bisacodyl  10 mg Rectal Once   clopidogrel  75 mg Oral Daily   vitamin B-12  1,000 mcg Oral Daily   ferrous sulfate  325 mg Oral QODAY   gabapentin  400 mg Oral BID   influenza vac split quadrivalent PF  0.5 mL Intramuscular Tomorrow-1000   insulin aspart  0-15 Units Subcutaneous TID WC   insulin aspart  4 Units Subcutaneous TID WC   insulin glargine-yfgn  10 Units Subcutaneous Daily   losartan  50 mg Oral Daily   metFORMIN  500 mg Oral BID WC   pneumococcal 20-valent conjugate vaccine  0.5 mL Intramuscular Tomorrow-1000   QUEtiapine  200 mg Oral QHS   rosuvastatin  40 mg Oral Daily   Continuous Infusions:   PRN Meds:.acetaminophen **OR** acetaminophen, alum & mag hydroxide-simeth, nicotine, ondansetron **OR** ondansetron (ZOFRAN)  IV, mouth rinse  Antimicrobials: Anti-infectives (From admission, onward)    None        I have personally reviewed the following labs and images: CBC: Recent Labs  Lab 04/06/22 0922 04/07/22 0410 04/08/22 0409 04/10/22 0358  WBC 5.9 5.6 5.7 7.1  NEUTROABS 3.6  --   --   --   HGB 7.4* 7.3* 7.1* 7.6*  HCT 26.2* 25.1* 25.0* 26.7*  MCV 73.2* 71.7* 73.1* 75.4*  PLT 271 282 292 322   BMP &GFR Recent Labs  Lab 04/06/22 0922 04/08/22 0409  NA 140 135  K 3.9 3.7  CL 113* 108  CO2 22 24  GLUCOSE 121* 105*  BUN 19 23*  CREATININE 0.72 0.79  CALCIUM 8.5* 8.2*  MG  --  1.9  PHOS  --  3.2   Estimated Creatinine Clearance: 91.5 mL/min (by C-G formula based on SCr of 0.79 mg/dL). Liver & Pancreas: Recent Labs   Lab 04/06/22 0922 04/08/22 0409  AST 30  --   ALT 34  --   ALKPHOS 64  --   BILITOT 0.3  --   PROT 7.1  --   ALBUMIN 3.5 2.7*   No results for input(s): "LIPASE", "AMYLASE" in the last 168 hours. No results for input(s): "AMMONIA" in the last 168 hours. Diabetic: No results for input(s): "HGBA1C" in the last 72 hours. Recent Labs  Lab 04/09/22 1130 04/09/22 1624 04/09/22 2107 04/10/22 0800 04/10/22 1221  GLUCAP 282* 137* 131* 127* 439*   Cardiac Enzymes: No results for input(s): "CKTOTAL", "CKMB", "CKMBINDEX", "TROPONINI" in the last 168 hours. No results for input(s): "PROBNP" in the last 8760 hours. Coagulation Profile: No results for input(s): "INR", "PROTIME" in the last 168 hours. Thyroid Function Tests: No results for input(s): "TSH", "T4TOTAL", "FREET4", "T3FREE", "THYROIDAB" in the last 72 hours. Lipid Profile: No results for input(s): "CHOL", "HDL", "LDLCALC", "TRIG", "CHOLHDL", "LDLDIRECT" in the last 72 hours. Anemia Panel: No results for input(s): "VITAMINB12", "FOLATE", "FERRITIN", "TIBC", "IRON", "RETICCTPCT" in the last 72 hours.  Urine analysis:    Component Value Date/Time   COLORURINE YELLOW 04/09/2022 1630   APPEARANCEUR HAZY (A) 04/09/2022 1630   LABSPEC 1.018 04/09/2022 1630   PHURINE 6.0 04/09/2022 1630   GLUCOSEU NEGATIVE 04/09/2022 1630   HGBUR NEGATIVE 04/09/2022 1630   BILIRUBINUR NEGATIVE 04/09/2022 1630   KETONESUR NEGATIVE 04/09/2022 1630   PROTEINUR NEGATIVE 04/09/2022 1630   UROBILINOGEN 0.2 10/06/2014 0856   NITRITE NEGATIVE 04/09/2022 1630   LEUKOCYTESUR NEGATIVE 04/09/2022 1630   Sepsis Labs: Invalid input(s): "PROCALCITONIN", "LACTICIDVEN"  Microbiology: No results found for this or any previous visit (from the past 240 hour(s)).  Radiology Studies: No results found.    Avin Upperman T. Taurean Ju [facility]  If 7PM-7AM, please contact night-coverage [email or web address] 04/10/2022, 2:17 PM

## 2022-04-10 NOTE — Progress Notes (Signed)
Patient continues to be noncompliant with safety precautions that are in place for her. Floor mats & bedside commode are next to bed, and bed alarm is set.

## 2022-04-10 NOTE — TOC Progression Note (Addendum)
Transition of Care [facility]) - Progression Note    Patient Details  Name: Kaitlyn Gray MRN: [id number] Date of Birth: February 12, 1976  Transition of Care [facility] CM/SW [place], RN Phone Number: 04/10/2022, 11:46 AM  Clinical Narrative:   PT recommended short term SNF, faxed out however no current bed offers. Patient does not have insurance for SNF placement. PASRR pending. Spoke with patient at bedside who reports she has applied for disability and Medicaid previously, received denial 11/2021. This RNCM referred to financial counseling for assistance.    Patietn reports she has support from aunt however she's 49 years old and provides limited assistance.   TOC will continue to follow.    Expected Discharge Plan: [place] Barriers to Discharge: Continued Medical Work up  Expected Discharge Plan and Services Expected Discharge Plan: [place] In-house Referral: Development worker, community Discharge Planning Services: CM Consult Post Acute Care Choice: [place] Living arrangements for the past 2 months: Single Family Home                           HH Arranged: NA [place] Agency: NA         Social Determinants of Health (SDOH) Interventions    Readmission Risk Interventions    04/09/2022   11:37 AM  Readmission Risk Prevention Plan  Transportation Screening Complete  Medication Review Press photographer) Complete  PCP or Specialist appointment within 3-5 days of discharge Complete  HRI or Home Care Consult Complete  Palliative Care Screening Not [place] Complete

## 2022-04-11 ENCOUNTER — Other Ambulatory Visit: Payer: Self-pay | Admitting: Student

## 2022-04-11 DIAGNOSIS — E1159 Type 2 diabetes mellitus with other circulatory complications: Secondary | ICD-10-CM

## 2022-04-11 DIAGNOSIS — I639 Cerebral infarction, unspecified: Secondary | ICD-10-CM | POA: Diagnosis not present

## 2022-04-11 DIAGNOSIS — Z5329 Procedure and treatment not carried out because of patient's decision for other reasons: Secondary | ICD-10-CM

## 2022-04-11 LAB — GLUCOSE, CAPILLARY
Glucose-Capillary: 91 mg/dL (ref 70–99)
Glucose-Capillary: 351 mg/dL — ABNORMAL HIGH (ref 70–99)

## 2022-04-11 MED ORDER — AMLODIPINE BESYLATE 5 MG PO TABS: 5.0000 mg | ORAL_TABLET | Freq: Every day | ORAL | 11 refills | Status: DC

## 2022-04-11 MED ORDER — LOSARTAN POTASSIUM 50 MG PO TABS: 50.0000 mg | ORAL_TABLET | Freq: Every day | ORAL | 1 refills | Status: DC

## 2022-04-11 MED ORDER — INSULIN ASPART 100 UNIT/ML IJ SOLN
0.0000 [IU] | Freq: Three times a day (TID) | INTRAMUSCULAR | Status: DC
  Administered 2022-04-11: 9 [IU] via SUBCUTANEOUS

## 2022-04-11 MED ORDER — METFORMIN HCL ER 500 MG PO TB24: 500.0000 mg | ORAL_TABLET | Freq: Two times a day (BID) | ORAL | 0 refills | Status: DC

## 2022-04-11 NOTE — Discharge Summary (Signed)
Physician Discharge Summary  Kaitlyn Gray ZOX:[id number] DOB: [birth year] DOA: 04/06/2022  PCP: Patient, No Pcp Per  Admit date: 04/06/2022 Discharge date: 04/11/2022 Admitted From: Home. Disposition: Left AGAINST MEDICAL ADVICE   Discharge Condition: Stable CODE STATUS: Full code   Hospital course 46 year old F with PMH of recent CVA, NIDDM-2, HTN, BPD 1, polysubstance use including marijuana, cocaine and tobacco, anemia and noncompliance returning with fall at home and difficulty ambulating.   Patient was hospitalized 10/15-10/20 for acute CVA.  At that time, MRI brain showed cluster of acute infarction at the left frontoparietal vertex, punctate acute infarct in right medial parietal lobe and old midbrain, cerebellar and cerebral infarcts.  CTA head and neck showed significant intracranial and extracranial stenosis.  UDS was positive for cocaine.  Neurology recommended DAPT for 3 months and then Plavix alone, and outpatient follow-up with neuro IR for right mid ICA stenosis.  Therapy recommended SNF but patient decided to leave AMA.  Patient did not continue his antiplatelet after leaving AMA.   On arrival to ED, vitals stable.  Hgb 7.4 (baseline about 8).  Otherwise, CMP and CBC without significant finding.  Glucose 121.  EtOH less than 10.  UDS positive for cocaine.  EKG without acute finding other than previously known CVA.    Patient remained stable, Therapy recommended SNF.  While waiting on insurance authorization, she decided to leave AGAINST MEDICAL ADVICE.  Patient demonstrates capacity to make medical decision for involuntary commitment against her wish.  See individual problem list below for more.   Problems addressed during this hospitalization Principal Problem:   Stroke (cerebrum) (HCC) Active Problems:   CVA (cerebral vascular accident) (HCC)   Type 2 diabetes mellitus (HCC)   Hypertension associated with diabetes (HCC)   Cocaine use disorder, severe,  dependence (HCC)   Depression   Bipolar disorder (HCC)   Polysubstance abuse (HCC)   Microcytic anemia   Noncompliance with medications   Hemiparesis affecting left side as late effect of cerebrovascular accident (CVA) (HCC)   Class 1 obesity   Tobacco abuse   Iron deficiency anemia   Fall at home, initial encounter   Ambulatory dysfunction   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   Fall at home/ambulatory dysfunction likely due to CVA and polysubstance use.  Patient is not able to care for herself.  She lives alone.  No family members close by.  -PT/OT-rec and at SNF   Subacute CVA: hospitalized 10/15-10/20 MRI showed scattered infarcts in bilateral ACA territory with multifactorial intracranial and extracranial stenosis.  Neurology recommended Plavix and aspirin for 3 months followed by Plavix alone, and outpatient follow-up with neuro IR.  Therapy recommended SNF but patient left AMA.  CT head on admission without acute finding.  UDS suggests ongoing cocaine use.  Extremity weakness seems to be better but inconsistent exam. -Continue Plavix and aspirin for 3 months followed by Plavix alone per previous neurology recommendation -Outpatient follow-up with neuro IR for right mid ICA 70% stenosis -Continue Crestor -Advised to refrain from cocaine, marijuana and tobacco. -PT/OT-recommended SNF   Extracranial and intracranial arterial stenosis -Management as above   Controlled NIDDM-2 with hypoglycemia, hyperglycemia and neuropathy: A1c 6.6% on 10/15. Erratic CBG due to dietary noncompliance.  Patient is upset about changing the diet to carb modified.  Recent Labs  Lab 04/10/22 1221 04/10/22 1741 04/10/22 2009 04/11/22 0732 04/11/22 1120  GLUCAP 439* 87 97 91 351*  -SSI-sensitive -Semglee 10 units daily with holding parameters -  Discontinue mealtime coverage for now -Continue Crestor and gabapentin   Iron deficiency anemia: Patient denies melena or  hematochezia.  Hgb down to 7.3 (7 to 8).  Anemia panel with iron deficiency previous hospitalization.  Received IV ferric gluconate x2.  Some nausea and abdominal pain after she started blood transfusion.  Recent Labs (within last 365 days)              Recent Labs    12/12/21 1245 12/26/21 0700 01/27/22 1505 02/28/22 1013 03/30/22 1058 04/01/22 0345 04/06/22 0922 04/07/22 0410 04/08/22 0409 04/10/22 0358  HGB 8.5* 8.1* 8.0*  9.2* 7.8* 7.9* 7.7* 7.4* 7.3* 7.1* 7.6*    -Received IV ferric gluconate 250 mg x 2 -May benefit from outpatient GI evaluation   Essential hypertension: BP elevated at times. -Increased losartan to 50 mg daily on 10/23. -Increased amlodipine to 5 mg daily on 10/25 -Avoid hypotension given intracranial and extracranial arterial stenosis.   Polysubstance use including cocaine, marijuana and tobacco -Counseled to refrain from substance use. -Not interested in nicotine patch.   Depression/bipolar disorder: Stable -Continue home Seroquel   Noncompliance with medication -Counseled.   Chronic back pain -Tylenol as needed   Obesity Body mass index is 32.01 kg/m.            Vital signs Vitals:   04/10/22 0726 04/10/22 2017 04/11/22 0519 04/11/22 1349  BP: (!) 147/78 (!) 150/83 139/72 (!) 147/91  Pulse: 73 79 76 78  Temp: 97.9 F (36.6 C) 98.2 F (36.8 C) 98.6 F (37 C) 98.3 F (36.8 C)  Resp: Height:      Weight:      SpO2: 100% 100% 100% 100%  TempSrc: Axillary Oral Oral Oral  BMI (Calculated):         Discharge exam  GENERAL: No apparent distress.  Nontoxic. HEENT: MMM.  Vision and hearing grossly intact.  NECK: Supple.  No apparent JVD.  RESP:  No IWOB.  Fair aeration bilaterally. CVS:  RRR. Heart sounds normal.  ABD/GI/GU: BS+. Abd soft, NTND.  MSK/EXT:  Moves extremities. No apparent deformity. No edema.  SKIN: no apparent skin lesion or wound NEURO: Awake and alert. Oriented x4.  Notable dysarthria.   Bilateral lower extremity weakness but inconsistent exam.  Motor 4/5 in all extremities PSYCH: Calm. Normal affect.   Discharge Instructions  Allergies as of 04/11/2022       Reactions   Hydrocodone Itching   Latex Rash     Med Rec must be completed prior to using this SMARTLINK        Consultations: None  Procedures/Studies:   CT Head Wo Contrast  Result Date: 04/06/2022 CLINICAL DATA:  Dizziness EXAM: CT HEAD WITHOUT CONTRAST TECHNIQUE: Contiguous axial images were obtained from the base of the skull through the vertex without intravenous contrast. RADIATION DOSE REDUCTION: This exam was performed according to the departmental dose-optimization program which includes automated exposure control, adjustment of the mA and/or kV according to patient size and/or use of iterative reconstruction technique. COMPARISON:  Recent CT and MR imaging of the head 03/30/2022 FINDINGS: Brain: No evidence of acute infarction, hemorrhage, hydrocephalus, extra-axial collection or mass lesion/mass effect. Focal encephalomalacia in the medial right occipital lobe again noted. Known cluster of acute infarcts in the left frontoparietal vertex and right medial parietal lobe better demonstrated on prior MR imaging. Vascular: No hyperdense vessel or unexpected calcification. Skull: Normal. Negative for fracture or focal lesion. Sinuses/Orbits: No acute finding. Other: None. IMPRESSION:  1. No new acute intracranial abnormality. 2. Cluster of known acute infarcts in the left frontoparietal vertex and right medial parietal lobe better demonstrated on recent prior MR imaging. 3. Sequelae of remote infarct to the medial right occipital lobe again noted. Electronically Signed   By: Malachy Moan M.D.   On: 04/06/2022 08:20   DG Abd 1 View  Result Date: 04/02/2022 CLINICAL DATA:  Abdominal pain EXAM: ABDOMEN - 1 VIEW COMPARISON:  Abdominal radiographs 06/26/2021 FINDINGS: Gas-filled loops of colon at the upper  limits of normal in caliber. No definite obstruction. No acute osseous abnormality. No radiopaque calculi. IMPRESSION: Gas-filled loops of colon at the upper limits of normal in caliber. No definite obstruction. Electronically Signed   By: Minerva Fester M.D.   On: 04/02/2022 23:44   VAS [place] LOWER EXTREMITY VENOUS (DVT)  Result Date: 04/01/2022  Lower Venous DVT Study Patient Name:  Kaitlyn Gray  Date of Exam:   04/01/2022 Medical Rec #: [id number]          Accession #:    [id number] Date of Birth: 1976/03/16          Patient Gender: F Patient Age:   25 years Exam Location:  [facility] Procedure:      VAS [place] LOWER EXTREMITY VENOUS (DVT) Referring Phys: Scheryl Marten XU --------------------------------------------------------------------------------  Indications: Stroke.  Comparison Study: No previous exams Performing Technologist: Jody Hill RVT, RDMS  Examination Guidelines: A complete evaluation includes B-mode imaging, spectral Doppler, color Doppler, and power Doppler as needed of all accessible portions of each vessel. Bilateral testing is considered an integral part of a complete examination. Limited examinations for reoccurring indications may be performed as noted. The reflux portion of the exam is performed with the patient in reverse Trendelenburg.  +---------+---------------+---------+-----------+----------+--------------+ RIGHT    CompressibilityPhasicitySpontaneityPropertiesThrombus Aging +---------+---------------+---------+-----------+----------+--------------+ CFV      Full           Yes      Yes                                 +---------+---------------+---------+-----------+----------+--------------+ SFJ      Full                                                        +---------+---------------+---------+-----------+----------+--------------+ FV Prox  Full           Yes      Yes                                  +---------+---------------+---------+-----------+----------+--------------+ FV Mid   Full           Yes      Yes                                 +---------+---------------+---------+-----------+----------+--------------+ FV DistalFull           Yes      Yes                                 +---------+---------------+---------+-----------+----------+--------------+ PFV  Full                                                        +---------+---------------+---------+-----------+----------+--------------+ POP      Full           Yes      Yes                                 +---------+---------------+---------+-----------+----------+--------------+ PTV      Full                                                        +---------+---------------+---------+-----------+----------+--------------+ PERO     Full                                                        +---------+---------------+---------+-----------+----------+--------------+   +---------+---------------+---------+-----------+----------+--------------+ LEFT     CompressibilityPhasicitySpontaneityPropertiesThrombus Aging +---------+---------------+---------+-----------+----------+--------------+ CFV      Full           Yes      Yes                                 +---------+---------------+---------+-----------+----------+--------------+ SFJ      Full                                                        +---------+---------------+---------+-----------+----------+--------------+ FV Prox  Full           Yes      Yes                                 +---------+---------------+---------+-----------+----------+--------------+ FV Mid   Full           Yes      Yes                                 +---------+---------------+---------+-----------+----------+--------------+ FV DistalFull           Yes      Yes                                  +---------+---------------+---------+-----------+----------+--------------+ PFV      Full                                                        +---------+---------------+---------+-----------+----------+--------------+ POP  Full           Yes      Yes                                 +---------+---------------+---------+-----------+----------+--------------+ PTV      Full                                                        +---------+---------------+---------+-----------+----------+--------------+ PERO     Full                                                        +---------+---------------+---------+-----------+----------+--------------+     Summary: BILATERAL: - No evidence of deep vein thrombosis seen in the lower extremities, bilaterally. -No evidence of popliteal cyst, bilaterally.   *See table(s) above for measurements and observations. Electronically signed by Lemar Livings MD on 04/01/2022 at 3:27:04 PM.    Final    CT ANGIO HEAD NECK W WO CM  Addendum Date: 03/31/2022   ADDENDUM REPORT: 03/31/2022 19:06 ADDENDUM: Moderate to severe stenosis in the distal A2 or proximal A3 (series 6, image 255). The distal ACAs are somewhat irregular. As described in the body of the report, there is approximately 70% stenosis in the mid right extracranial ICA. These findings were discussed by telephone on 03/31/2022 at 7:00 pm with provider XU. Electronically Signed   By: Wiliam Ke M.D.   On: 03/31/2022 19:06   Result Date: 03/31/2022 CLINICAL DATA:  Weakness and falls EXAM: CT ANGIOGRAPHY HEAD AND NECK TECHNIQUE: Multidetector CT imaging of the head and neck was performed using the standard protocol during bolus administration of intravenous contrast. Multiplanar CT image reconstructions and MIPs were obtained to evaluate the vascular anatomy. Carotid stenosis measurements (when applicable) are obtained utilizing NASCET criteria, using the distal internal carotid diameter as  the denominator. RADIATION DOSE REDUCTION: This exam was performed according to the departmental dose-optimization program which includes automated exposure control, adjustment of the mA and/or kV according to patient size and/or use of iterative reconstruction technique. CONTRAST:  75mL OMNIPAQUE IOHEXOL 350 MG/ML SOLN COMPARISON:  None Available. FINDINGS: CTA NECK FINDINGS SKELETON: There is no bony spinal canal stenosis. No lytic or blastic lesion. OTHER NECK: Normal pharynx, larynx and major salivary glands. No cervical lymphadenopathy. Unremarkable thyroid gland. UPPER CHEST: No pneumothorax or pleural effusion. No nodules or masses. AORTIC ARCH: There is no calcific atherosclerosis of the aortic arch. There is no aneurysm, dissection or hemodynamically significant stenosis of the visualized portion of the aorta. Conventional 3 vessel aortic branching pattern. The visualized proximal subclavian arteries are widely patent. RIGHT CAROTID SYSTEM: There is an area of approximately 70% stenosis of the mid right ICA (5:17, at the location of marked curvature. LEFT CAROTID SYSTEM: Normal without aneurysm, dissection or stenosis. VERTEBRAL ARTERIES: Left dominant configuration. Both origins are clearly patent. There is no dissection, occlusion or flow-limiting stenosis to the skull base (V1-V3 segments). CTA HEAD FINDINGS POSTERIOR CIRCULATION: --Vertebral arteries: Normal V4 segments. --Inferior cerebellar arteries: Normal. --Basilar artery: Normal. --Superior cerebellar arteries: Normal. --Posterior cerebral arteries (PCA): There is moderate stenosis  of the right PCA distal P1 segment, severe stenosis of the proximal P2 segment and occlusion of proximal P3 segment. Left PCA is unremarkable. ANTERIOR CIRCULATION: --Intracranial internal carotid arteries: Normal. --Anterior cerebral arteries (ACA): Normal. Both A1 segments are present. Patent anterior communicating artery (a-comm). --Middle cerebral arteries (MCA):  Normal. VENOUS SINUSES: As permitted by contrast timing, patent. ANATOMIC VARIANTS: None Review of the MIP images confirms the above findings. 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 Electronically Signed: By: Deatra Robinson M.D. On: 03/30/2022 20:22   ECHOCARDIOGRAM COMPLETE BUBBLE STUDY  Result Date: 03/31/2022    ECHOCARDIOGRAM REPORT   Patient Name:   Kaitlyn Gray Date of Exam: 03/31/2022 Medical Rec #:  [id number]         Height:       64.0 in Accession #:    [id number]        Weight:       200.0 lb Date of Birth:  1976/03/03         BSA:          1.956 m Patient Age:    46 years          BP:           166/100 mmHg Patient Gender: F                 HR:           56 bpm. Exam Location:  Inpatient Procedure: 2D Echo, 3D Echo, Cardiac Doppler, Color Doppler and Saline Contrast            Bubble Study Indications:    Stroke  History:        Patient has prior history of Echocardiogram examinations, most                 recent 09/07/2020. TIA; Risk Factors:Hypertension, Diabetes and                 Current Smoker.  Sonographer:    Milda Smart Referring Phys: Erick Blinks  Sonographer Comments: Image acquisition challenging due to patient body habitus, Image acquisition challenging due to uncooperative patient and Image acquisition challenging due to respiratory motion. IMPRESSIONS  1. Left ventricular ejection fraction, by estimation, is 70 to 75%. The left ventricle has hyperdynamic function. The left ventricle has no regional wall motion abnormalities. There is moderate concentric left ventricular hypertrophy. Left ventricular diastolic parameters are consistent with Grade II diastolic dysfunction (pseudonormalization).  2. Right ventricular systolic function is normal. The right ventricular size is normal.  3. Left atrial size was mildly dilated.  4. The mitral valve is normal in structure.  Trivial mitral valve regurgitation. No evidence of mitral stenosis.  5. The aortic valve is tricuspid. Aortic valve regurgitation is mild. No aortic stenosis is present.  6. The inferior vena cava is normal in size with greater than 50% respiratory variability, suggesting right atrial pressure of 3 mmHg.  7. Agitated saline contrast bubble study was negative, with no evidence of any interatrial shunt. Comparison(s): No significant change from prior study. Prior images reviewed side by side. Conclusion(s)/Recommendation(s): No intracardiac source of embolism detected on this transthoracic study. Consider a transesophageal echocardiogram to exclude cardiac source of embolism if clinically indicated. FINDINGS  Left Ventricle: Left ventricular ejection fraction, by estimation, is 70 to 75%. The left ventricle has hyperdynamic function. The left ventricle has no regional wall  motion abnormalities. 3D left ventricular ejection fraction analysis performed but not  reported based on interpreter judgement due to suboptimal tracking. The left ventricular internal cavity size was normal in size. There is moderate concentric left ventricular hypertrophy. Left ventricular diastolic parameters are consistent with Grade II diastolic dysfunction (pseudonormalization). Right Ventricle: The right ventricular size is normal. No increase in right ventricular wall thickness. Right ventricular systolic function is normal. Left Atrium: Left atrial size was mildly dilated. Right Atrium: Right atrial size was normal in size. Pericardium: There is no evidence of pericardial effusion. Mitral Valve: The mitral valve is normal in structure. Trivial mitral valve regurgitation. No evidence of mitral valve stenosis. Tricuspid Valve: The tricuspid valve is normal in structure. Tricuspid valve regurgitation is trivial. Aortic Valve: The aortic valve is tricuspid. Aortic valve regurgitation is mild. Aortic regurgitation PHT measures 460 msec. No  aortic stenosis is present. Pulmonic Valve: The pulmonic valve was normal in structure. Pulmonic valve regurgitation is trivial. Aorta: The aortic root and ascending aorta are structurally normal, with no evidence of dilitation. Venous: The inferior vena cava is normal in size with greater than 50% respiratory variability, suggesting right atrial pressure of 3 mmHg. IAS/Shunts: The atrial septum is grossly normal. Agitated saline contrast was given intravenously to evaluate for intracardiac shunting. Agitated saline contrast bubble study was negative, with no evidence of any interatrial shunt.  LEFT VENTRICLE PLAX 2D LVIDd:         4.70 cm   Diastology LVIDs:         2.70 cm   LV e' medial:    6.85 cm/s LV PW:         1.40 cm   LV E/e' medial:  11.7 LV IVS:        1.10 cm   LV e' lateral:   7.29 cm/s LVOT diam:     2.10 cm   LV E/e' lateral: 11.0 LV SV:         94 LV SV Index:   48 LVOT Area:     3.46 cm                           3D Volume EF:                          3D EF:        52 %                          LV EDV:       141 ml                          LV ESV:       67 ml                          LV SV:        74 ml RIGHT VENTRICLE RV S prime:     14.90 cm/s TAPSE (M-mode): 2.6 cm LEFT ATRIUM           Index        RIGHT ATRIUM           Index LA diam:      4.10 cm 2.10 cm/m   RA Area:     14.30 cm LA Vol (A4C): 60.4 ml 30.87  ml/m  RA Volume:   32.20 ml  16.46 ml/m  AORTIC VALVE LVOT Vmax:   127.00 cm/s LVOT Vmean:  83.500 cm/s LVOT VTI:    0.272 m AI PHT:      460 msec  AORTA Ao Root diam: 2.80 cm Ao Asc diam:  3.40 cm MITRAL VALVE MV Area (PHT): 2.90 cm    SHUNTS MV Decel Time: 262 msec    Systemic VTI:  0.27 m MV E velocity: 80.20 cm/s  Systemic Diam: 2.10 cm MV A velocity: 57.60 cm/s MV E/A ratio:  1.39 Laurance Flatten MD Electronically signed by Laurance Flatten MD Signature Date/Time: 03/31/2022/11:06:16 AM    Final    MR BRAIN WO CONTRAST  Result Date: 03/30/2022 CLINICAL DATA:  Neuro  deficit, acute, stroke suspected. Generalized weakness on the left. EXAM: MRI HEAD WITHOUT CONTRAST TECHNIQUE: Multiplanar, multiecho pulse sequences of the brain and surrounding structures were obtained without intravenous contrast. COMPARISON:  Head CT earlier same day.  MRI 01/27/2022. FINDINGS: Brain: Diffusion imaging shows a cluster of acute infarctions at the left frontoparietal vertex. There are a few punctate acute infarctions in the right medial parietal lobe. Findings may represent anterior cerebral artery territory insults. No large confluent infarction. Elsewhere, old infarctions in the pons and right cerebellar peduncle appear unchanged. Old infarction affecting the right occipital cortical and subcortical brain. Chronic small-vessel ischemic changes of the white matter are unchanged. No evidence of acute hemorrhage, hydrocephalus or extra-axial collection. Vascular: Major vessels at the base of the brain show flow. Skull and upper cervical spine: Negative Sinuses/Orbits: No advanced inflammatory change.  Orbits negative. Other: None IMPRESSION: 1. Cluster of acute infarctions at the left frontoparietal vertex. Punctate acute infarctions in the right medial parietal lobe. Findings may represent anterior cerebral artery territory insults. No large confluent infarction. No hemorrhage or mass effect. 2. Old infarctions in the pons, right cerebellar peduncle and right occipital cortical and subcortical brain. Chronic small-vessel ischemic changes of the hemispheric white matter. Electronically Signed   By: Paulina Fusi M.D.   On: 03/30/2022 17:11   CT Head Wo Contrast  Result Date: 03/30/2022 CLINICAL DATA:  46 year old female history of generalized weakness. Fall. Dysarthria. EXAM: CT HEAD WITHOUT CONTRAST TECHNIQUE: Contiguous axial images were obtained from the base of the skull through the vertex without intravenous contrast. RADIATION DOSE REDUCTION: This exam was performed according to the  departmental dose-optimization program which includes automated exposure control, adjustment of the mA and/or kV according to patient size and/or use of iterative reconstruction technique. COMPARISON:  Head CT 02/28/2022. FINDINGS: Brain: Encephalomalacia in the medial aspect of the right occipital lobe, unchanged. No evidence of acute infarction, hemorrhage, hydrocephalus, extra-axial collection or mass lesion/mass effect. Vascular: No hyperdense vessel or unexpected calcification. Skull: Normal. Negative for fracture or focal lesion. Sinuses/Orbits: No acute finding. Other: None. IMPRESSION: 1. No acute intracranial abnormalities. 2. Remote right occipital infarct, unchanged. Electronically Signed   By: Trudie Reed M.D.   On: 03/30/2022 12:22       The results of significant diagnostics from this hospitalization (including imaging, microbiology, ancillary and laboratory) are listed below for reference.     Microbiology: No results found for this or any previous visit (from the past 240 hour(s)).   Labs:  CBC: Recent Labs  Lab 04/06/22 0922 04/07/22 0410 04/08/22 0409 04/10/22 0358  WBC 5.9 5.6 5.7 7.1  NEUTROABS 3.6  --   --   --   HGB 7.4* 7.3* 7.1* 7.6*  HCT 26.2*  25.1* 25.0* 26.7*  MCV 73.2* 71.7* 73.1* 75.4*  PLT 271 282 292 322   BMP &GFR Recent Labs  Lab 04/06/22 0922 04/08/22 0409  NA 140 135  K 3.9 3.7  CL 113* 108  CO2 22 24  GLUCOSE 121* 105*  BUN 19 23*  CREATININE 0.72 0.79  CALCIUM 8.5* 8.2*  MG  --  1.9  PHOS  --  3.2   Estimated Creatinine Clearance: 91.5 mL/min (by C-G formula based on SCr of 0.79 mg/dL). Liver & Pancreas: Recent Labs  Lab 04/06/22 0922 04/08/22 0409  AST 30  --   ALT 34  --   ALKPHOS 64  --   BILITOT 0.3  --   PROT 7.1  --   ALBUMIN 3.5 2.7*   No results for input(s): "LIPASE", "AMYLASE" in the last 168 hours. No results for input(s): "AMMONIA" in the last 168 hours. Diabetic: No results for input(s): "HGBA1C" in the  last 72 hours. Recent Labs  Lab 04/10/22 1221 04/10/22 1741 04/10/22 2009 04/11/22 0732 04/11/22 1120  GLUCAP 439* 87 97 91 351*   Cardiac Enzymes: No results for input(s): "CKTOTAL", "CKMB", "CKMBINDEX", "TROPONINI" in the last 168 hours. No results for input(s): "PROBNP" in the last 8760 hours. Coagulation Profile: No results for input(s): "INR", "PROTIME" in the last 168 hours. Thyroid Function Tests: No results for input(s): "TSH", "T4TOTAL", "FREET4", "T3FREE", "THYROIDAB" in the last 72 hours. Lipid Profile: No results for input(s): "CHOL", "HDL", "LDLCALC", "TRIG", "CHOLHDL", "LDLDIRECT" in the last 72 hours. Anemia Panel: No results for input(s): "VITAMINB12", "FOLATE", "FERRITIN", "TIBC", "IRON", "RETICCTPCT" in the last 72 hours. Urine analysis:    Component Value Date/Time   COLORURINE YELLOW 04/09/2022 1630   APPEARANCEUR HAZY (A) 04/09/2022 1630   LABSPEC 1.018 04/09/2022 1630   PHURINE 6.0 04/09/2022 1630   GLUCOSEU NEGATIVE 04/09/2022 1630   HGBUR NEGATIVE 04/09/2022 1630   BILIRUBINUR NEGATIVE 04/09/2022 1630   KETONESUR NEGATIVE 04/09/2022 1630   PROTEINUR NEGATIVE 04/09/2022 1630   UROBILINOGEN 0.2 10/06/2014 0856   NITRITE NEGATIVE 04/09/2022 1630   LEUKOCYTESUR NEGATIVE 04/09/2022 1630   Sepsis Labs: Invalid input(s): "PROCALCITONIN", "LACTICIDVEN"   SIGNED:  Almon Hercules, MD  [facility] 04/11/2022, 7:59 PM

## 2022-04-11 NOTE — Progress Notes (Signed)
Pt left the hospital AMA at 1530 this shift. The risks associated with leaving AMA have been explained with the pt still verbalizing their intent to leave. MD aware.

## 2022-04-11 NOTE — Plan of Care (Signed)
  Problem: Ischemic Stroke/TIA Tissue Perfusion: Goal: Complications of ischemic stroke/TIA will be minimized Outcome: Progressing   

## 2022-04-11 NOTE — Progress Notes (Signed)
Physical Therapy Treatment Patient Details Name: Kaitlyn Gray MRN: [id number] DOB: Nov 29, 1975 Today's Date: 04/11/2022   History of Present Illness Kaitlyn Gray is a 46 y.o. female with medical history significant of acute encephalopathy adjustment disorder, bipolar 1 disorder, cannabis use disorder, chronic anemia, cocaine use disorder, history of cervical fracture, hyperosmolar nonketotic state, type 2 diabetes, hypertension, left-sided weakness, dysarthria due to previous stroke who was recently admitted due to ischemic stroke, was supposed to be discharged to rehab but signed AMA and went home instead.  She has not taking her DAPT recommended by neurology since then.  She has had 3 or 4 falls while trying to ambulate at home.  She has no one else with her and has significant difficulty getting off the floor.  Denied loss of consciousness or other injuries during falls.  No fever, chills or night sweats. No sore throat, rhinorrhea, dyspnea, wheezing or hemoptysis.  No chest pain, palpitations, diaphoresis, PND, orthopnea or pitting edema of the lower extremities.  No appetite changes, abdominal pain, diarrhea, constipation, melena or hematochezia.  No flank pain, dysuria, frequency or hematuria.  No polyuria, polydipsia, polyphagia or blurred vision.    PT Comments    Pt received supine in bed and agreeable to be seen. Reports that her RUE is her only good extremity and that her L side is more affected than her R. Pt required supervision for bed mobility for safety and increased time. Required min assist for transfers and min assist +2 for ambulation in the hallway with RW and recliner follow; pt 's gait highly ataxic and demonstrates ?steppage gait though denies any foot drop, pt remains HIGH FALL RISK due to functional limitations combined with impulsivity. We will continue to follow acutely.    Recommendations for follow up therapy are one component of a multi-disciplinary discharge  planning process, led by the attending physician.  Recommendations may be updated based on patient status, additional functional criteria and insurance authorization.  Follow Up Recommendations  Skilled nursing-short term rehab (<3 hours/day) Can patient physically be transported by private vehicle: No   Assistance Recommended at Discharge Frequent or constant Supervision/Assistance  Patient can return home with the following A lot of help with walking and/or transfers;A lot of help with bathing/dressing/bathroom;Assistance with cooking/housework;Direct supervision/assist for medications management;Direct supervision/assist for financial management;Assist for transportation;Help with stairs or ramp for entrance   Equipment Recommendations   (TBA)    Recommendations for Other Services       Precautions / Restrictions Precautions Precautions: Fall Precaution Comments: high fall risk Restrictions Weight Bearing Restrictions: No Other Position/Activity Restrictions: inconsistant L Hemiparesis     Mobility  Bed Mobility Overal bed mobility: Needs Assistance Bed Mobility: Supine to Sit, Sit to Supine     Supine to sit: Supervision Sit to supine: Supervision   General bed mobility comments: Supervision for safety only, no physical assist required, increased time and use of bedrails.    Transfers Overall transfer level: Needs assistance Equipment used: Rolling walker (2 wheels) Transfers: Sit to/from Stand Sit to Stand: Min assist           General transfer comment: Min assist for steadying of RW, otherwise min guard.    Ambulation/Gait Ambulation/Gait assistance: Min assist, +2 safety/equipment Gait Distance (Feet): 50 Feet Assistive device: Rolling walker (2 wheels) Gait Pattern/deviations: Step-through pattern, Trunk flexed, Ataxic Gait velocity: decreased     General Gait Details: Pt ambualted with RW and min assist +2 for recliner follow for safety, no overt  LOB  noted. Pt demonstrated steppage gait bilaterally with excessive force during the heel down portion, high degree of speed despite gait deviations. [place] FALL RISK.   Stairs             Wheelchair Mobility    Modified Rankin (Stroke Patients Only) Modified Rankin (Stroke Patients Only) Pre-Morbid Rankin Score: Moderate disability Modified Rankin: Moderately severe disability     Balance Overall balance assessment: Needs assistance Sitting-balance support: Feet supported, No upper extremity supported Sitting balance-Leahy Scale: Fair Sitting balance - Comments: static sitting-good  dynamic sitting-fair+   Standing balance support: Bilateral upper extremity supported, Reliant on assistive device for balance Standing balance-Leahy Scale: Poor Standing balance comment: needs UE support to maintain standing balance                            Cognition Arousal/Alertness: Awake/alert Behavior During Therapy: WFL for tasks assessed/performed Overall Cognitive Status: No family/caregiver present to determine baseline cognitive functioning Area of Impairment: Orientation, Safety/judgement                 Orientation Level: Time       Safety/Judgement: Decreased awareness of safety, Decreased awareness of deficits     General Comments: AxO x 3 pleasant/cooperative        Exercises      General Comments        Pertinent Vitals/Pain Pain Assessment Pain Assessment: No/denies pain    Home Living                          Prior Function            PT Goals (current goals can now be found in the care plan section) Acute Rehab PT Goals Patient Stated Goal: get rehab PT Goal Formulation: With patient Time For Goal Achievement: 04/21/22 Potential to Achieve Goals: Good Progress towards PT goals: Progressing toward goals    Frequency    Min 3X/week      PT Plan Current plan remains appropriate    Co-evaluation               AM-PAC PT "6 Clicks" Mobility   Outcome Measure  Help needed turning from your back to your side while in a flat bed without using bedrails?: None Help needed moving from lying on your back to sitting on the side of a flat bed without using bedrails?: A Little Help needed moving to and from a bed to a chair (including a wheelchair)?: A Little Help needed standing up from a chair using your arms (e.g., wheelchair or bedside chair)?: A Lot Help needed to walk in hospital room?: A Lot Help needed climbing 3-5 steps with a railing? : Total 6 Click Score: 15    End of Session Equipment Utilized During Treatment: Gait belt Activity Tolerance: Patient tolerated treatment well;No increased pain Patient left: in bed;with call bell/phone within reach;with bed alarm set Nurse Communication: Mobility status PT Visit Diagnosis: Unsteadiness on feet (R26.81);Other abnormalities of gait and mobility (R26.89);Muscle weakness (generalized) (M62.81);History of falling (Z91.81);Hemiplegia and hemiparesis;Pain;Ataxic gait (R26.0) Hemiplegia - Right/Left:  (? both) Hemiplegia - dominant/non-dominant: Dominant Hemiplegia - caused by: Cerebral infarction     Time: 1247-1300 PT Time Calculation (min) (ACUTE ONLY): 13 min  Charges:  $Gait Training: 8-22 mins                     Gwyndolyn Saxon  Drema Pry, DPT [facility] Office: [phone] Weekend pager: [phone]   Jamesetta Geralds 04/11/2022, 3:10 PM

## 2022-04-11 NOTE — Progress Notes (Signed)
Patient left AMA earlier this afternoon.  Reviewed patient's medication list.  She filled aspirin, Plavix and Crestor on 04/04/2022.  I have sent new prescription for losartan with new dose, amlodipine (new) and refilled her metformin.  Sent memo to the pharmacist to call patient when prescriptions are ready for pickup

## 2022-04-12 ENCOUNTER — Encounter (HOSPITAL_COMMUNITY): Payer: Self-pay

## 2022-04-12 ENCOUNTER — Other Ambulatory Visit: Payer: Self-pay

## 2022-04-12 ENCOUNTER — Emergency Department (HOSPITAL_COMMUNITY)
Admission: EM | Admit: 2022-04-12 | Discharge: 2022-04-13 | Disposition: A | Discharge status: Home / Self Care | Payer: Medicaid Other | Attending: Emergency Medicine | Admitting: Emergency Medicine

## 2022-04-12 ENCOUNTER — Emergency Department (HOSPITAL_COMMUNITY): Payer: Medicaid Other

## 2022-04-12 DIAGNOSIS — Z8673 Personal history of transient ischemic attack (TIA), and cerebral infarction without residual deficits: Secondary | ICD-10-CM | POA: Diagnosis not present

## 2022-04-12 DIAGNOSIS — R269 Unspecified abnormalities of gait and mobility: Secondary | ICD-10-CM

## 2022-04-12 DIAGNOSIS — I10 Essential (primary) hypertension: Secondary | ICD-10-CM | POA: Diagnosis not present

## 2022-04-12 DIAGNOSIS — R072 Precordial pain: Secondary | ICD-10-CM | POA: Diagnosis not present

## 2022-04-12 DIAGNOSIS — R7309 Other abnormal glucose: Secondary | ICD-10-CM | POA: Diagnosis not present

## 2022-04-12 DIAGNOSIS — R2689 Other abnormalities of gait and mobility: Secondary | ICD-10-CM | POA: Diagnosis not present

## 2022-04-12 DIAGNOSIS — Z87898 Personal history of other specified conditions: Secondary | ICD-10-CM

## 2022-04-12 DIAGNOSIS — D649 Anemia, unspecified: Secondary | ICD-10-CM | POA: Diagnosis not present

## 2022-04-12 DIAGNOSIS — R079 Chest pain, unspecified: Secondary | ICD-10-CM | POA: Diagnosis present

## 2022-04-12 DIAGNOSIS — Z8659 Personal history of other mental and behavioral disorders: Secondary | ICD-10-CM | POA: Diagnosis not present

## 2022-04-12 LAB — TROPONIN I (HIGH SENSITIVITY): Troponin I (High Sensitivity): 7 ng/L (ref <18)

## 2022-04-12 LAB — CBC
HCT: 27.4 % — ABNORMAL LOW (ref 36.0–46.0)
Hemoglobin: 7.9 g/dL — ABNORMAL LOW (ref 12.0–15.0)
MCH: 21.4 pg — ABNORMAL LOW (ref 26.0–34.0)
MCHC: 28.8 g/dL — ABNORMAL LOW (ref 30.0–36.0)
MCV: 74.1 fL — ABNORMAL LOW (ref 80.0–100.0)
Platelets: 390 10*3/uL (ref 150–400)
RBC: 3.7 MIL/uL — ABNORMAL LOW (ref 3.87–5.11)
RDW: 22.2 % — ABNORMAL HIGH (ref 11.5–15.5)
WBC: 7.8 10*3/uL (ref 4.0–10.5)
nRBC: 0 % (ref 0.0–0.2)

## 2022-04-12 LAB — BASIC METABOLIC PANEL
Anion gap: 8 (ref 5–15)
BUN: 19 mg/dL (ref 6–20)
CO2: 22 mmol/L (ref 22–32)
Calcium: 8.5 mg/dL — ABNORMAL LOW (ref 8.9–10.3)
Chloride: 109 mmol/L (ref 98–111)
Creatinine, Ser: 0.77 mg/dL (ref 0.44–1.00)
GFR, Estimated: >60 mL/min (ref >60)
Glucose, Bld: 100 mg/dL — ABNORMAL HIGH (ref 70–99)
Potassium: 3.4 mmol/L — ABNORMAL LOW (ref 3.5–5.1)
Sodium: 139 mmol/L (ref 135–145)

## 2022-04-12 LAB — CBG MONITORING, ED
Glucose-Capillary: 116 mg/dL — ABNORMAL HIGH (ref 70–99)
Glucose-Capillary: 120 mg/dL — ABNORMAL HIGH (ref 70–99)

## 2022-04-12 MED ORDER — GLIPIZIDE ER 10 MG PO TB24: 10.0000 mg | ORAL_TABLET | Freq: Every day | ORAL | Status: DC

## 2022-04-12 MED ORDER — GABAPENTIN 400 MG PO CAPS
400.0000 mg | ORAL_CAPSULE | Freq: Two times a day (BID) | ORAL | Status: DC
  Administered 2022-04-12 – 2022-04-13 (×2): 400 mg via ORAL
  Filled 2022-04-12 (×3): qty 1

## 2022-04-12 MED ORDER — METFORMIN HCL ER 500 MG PO TB24
500.0000 mg | ORAL_TABLET | Freq: Two times a day (BID) | ORAL | Status: DC
  Administered 2022-04-12 – 2022-04-13 (×2): 500 mg via ORAL
  Filled 2022-04-12 (×3): qty 1

## 2022-04-12 MED ORDER — POTASSIUM CHLORIDE 20 MEQ PO PACK
40.0000 meq | PACK | Freq: Once | ORAL | Status: DC
  Filled 2022-04-12: qty 2

## 2022-04-12 MED ORDER — CLOPIDOGREL BISULFATE 75 MG PO TABS
75.0000 mg | ORAL_TABLET | Freq: Every day | ORAL | Status: DC
  Administered 2022-04-12 – 2022-04-13 (×2): 75 mg via ORAL
  Filled 2022-04-12 (×2): qty 1

## 2022-04-12 MED ORDER — FERROUS SULFATE 325 (65 FE) MG PO TABS
325.0000 mg | ORAL_TABLET | ORAL | Status: DC
  Administered 2022-04-12: 325 mg via ORAL
  Filled 2022-04-12: qty 1

## 2022-04-12 MED ORDER — QUETIAPINE FUMARATE 100 MG PO TABS
200.0000 mg | ORAL_TABLET | Freq: Every day | ORAL | Status: DC
  Administered 2022-04-12: 200 mg via ORAL
  Filled 2022-04-12: qty 2

## 2022-04-12 MED ORDER — ASPIRIN 81 MG PO TBEC
81.0000 mg | DELAYED_RELEASE_TABLET | Freq: Every day | ORAL | Status: DC
  Administered 2022-04-12 – 2022-04-13 (×2): 81 mg via ORAL
  Filled 2022-04-12 (×2): qty 1

## 2022-04-12 MED ORDER — FAMOTIDINE 20 MG PO TABS
20.0000 mg | ORAL_TABLET | Freq: Once | ORAL | Status: AC
  Administered 2022-04-12: 20 mg via ORAL
  Filled 2022-04-12: qty 1

## 2022-04-12 MED ORDER — LOSARTAN POTASSIUM 25 MG PO TABS
50.0000 mg | ORAL_TABLET | Freq: Every day | ORAL | Status: DC
  Administered 2022-04-12 – 2022-04-13 (×2): 50 mg via ORAL
  Filled 2022-04-12 (×2): qty 2

## 2022-04-12 MED ORDER — ALUM & MAG HYDROXIDE-SIMETH 200-200-20 MG/5ML PO SUSP
30.0000 mL | Freq: Once | ORAL | Status: DC
  Filled 2022-04-12: qty 30

## 2022-04-12 MED ORDER — ROSUVASTATIN CALCIUM 20 MG PO TABS
40.0000 mg | ORAL_TABLET | Freq: Every day | ORAL | Status: DC
  Administered 2022-04-12 – 2022-04-13 (×2): 40 mg via ORAL
  Filled 2022-04-12 (×2): qty 2

## 2022-04-12 MED ORDER — ACETAMINOPHEN 500 MG PO TABS
1000.0000 mg | ORAL_TABLET | Freq: Once | ORAL | Status: AC
  Administered 2022-04-12: 1000 mg via ORAL
  Filled 2022-04-12: qty 2

## 2022-04-12 NOTE — ED Notes (Signed)
Pt given sandwich and soda.  

## 2022-04-12 NOTE — ED Notes (Signed)
Pt witnessed fall in hall while ambulating to bed, caught by staff and lowered to ground. No head strike. Denies any pain/injury from incident. Pt helped back to bed by staff.   MD Darl Householder at bedside to assess.

## 2022-04-12 NOTE — ED Notes (Signed)
Pt given a sandwich and soda. 

## 2022-04-12 NOTE — ED Notes (Signed)
Snack given to pt with soda at this time

## 2022-04-12 NOTE — ED Notes (Signed)
Meal tray provided.

## 2022-04-12 NOTE — ED Triage Notes (Addendum)
Pt BIB EMS from home c/o chest pain that started at 11pm last night. Per EMS pt refused Aspirin.  168/88 80hr 100% room air Cbg 86

## 2022-04-12 NOTE — ED Provider Notes (Addendum)
[facility] DEPT Provider Note   CSN: [id number] Arrival date & time: 04/12/22  1638     History  Chief Complaint  Patient presents with   Chest Pain    Kaitlyn Gray is a 46 y.o. female.  Pt c/o mid chest pain since last night. Symptoms at rest, dull pain midline/mid chest, non radiating, worse w certain movements. No associated sob, nv or diaphoresis. No pleuritic pain. No leg pain or swelling. No cough or fever. Occasional heartburn/indigestion. Denies hx cad or fam hx cad. No exertional cp or discomfort. No unusual doe or fatigue. 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 Pt indicates she feels cannot care for self at home.   The history is provided by the patient, medical records and the EMS personnel. The history is limited by the condition of the patient.  Chest Pain Associated symptoms: no abdominal pain, no back pain, no cough, no fever, no headache, no nausea, no palpitations, no shortness of breath and no vomiting        Home Medications Prior to Admission medications   Medication Sig Start Date End Date Taking? Authorizing Provider  amLODipine (NORVASC) 5 MG tablet Take 1 tablet (5 mg total) by mouth daily. 04/11/22 04/11/23  Mercy Riding, MD  aspirin EC 81 MG tablet Take 1 tablet (81 mg total) by mouth daily. Swallow whole. 04/05/22   August Albino, MD  blood glucose meter kit and supplies KIT Dispense based on patient and insurance preference. Use up to four times daily as directed. (FOR ICD-9 250.00, 250.01). Please give 3 months supply of lancets and test strips to allow her to check QID. 12/07/17   Roxan Hockey, MD  blood glucose meter kit and supplies Relion Prime or Dispense other brand based on patient and insurance preference. Use up to four times daily as directed. (FOR ICD-9 250.00, 250.01). 12/07/17   Roxan Hockey, MD  clopidogrel (PLAVIX)  75 MG tablet Take 1 tablet (75 mg total) by mouth daily. 04/05/22   August Albino, MD  ferrous sulfate 325 (65 FE) MG tablet Take 1 tablet (325 mg total) by mouth every other day. 04/05/22   August Albino, MD  gabapentin (NEURONTIN) 400 MG capsule Take 1 capsule (400 mg total) by mouth 2 (two) times daily. 09/13/20   Nita Sells, MD  glipiZIDE (GLUCOTROL XL) 10 MG 24 hr tablet Take 10 mg by mouth daily. 10/17/21   [provider]  Insulin Pen Needle 31G X 5 MM MISC Use as directed 12/07/17   Roxan Hockey, MD  losartan (COZAAR) 50 MG tablet Take 1 tablet (50 mg total) by mouth daily. 04/11/22   Mercy Riding, MD  metFORMIN (GLUCOPHAGE-XR) 500 MG 24 hr tablet Take 1 tablet (500 mg total) by mouth 2 (two) times daily. 04/11/22   Mercy Riding, MD  QUEtiapine (SEROQUEL) 200 MG tablet Take 200 mg by mouth at bedtime. 08/31/20   [provider]  rosuvastatin (CRESTOR) 40 MG tablet Take 1 tablet (40 mg total) by mouth daily. 04/05/22   August Albino, MD      Allergies    Hydrocodone and Latex    Review of Systems   Review of Systems  Constitutional:  Negative for chills and fever.  HENT:  Negative for sore throat.   Eyes:  Negative for redness.  Respiratory:  Negative for cough and shortness of breath.   Cardiovascular:  Positive for chest pain. Negative for palpitations and leg swelling.  Gastrointestinal:  Negative for abdominal pain, nausea and vomiting.  Genitourinary:  Negative for flank pain.  Musculoskeletal:  Negative for back pain and neck pain.  Skin:  Negative for rash.  Neurological:  Negative for headaches.  Hematological:  Does not bruise/bleed easily.  Psychiatric/Behavioral:  Negative for confusion.     Physical Exam Updated Vital Signs BP (!) 162/79   Pulse 82   Temp 97.8 F (36.6 C) (Oral)   Resp 18   Ht 1.626 m (5' 4")   Wt 85 kg   SpO2 97%   BMI 32.17 kg/m  Physical Exam Vitals and nursing note reviewed.  Constitutional:       Appearance: Normal appearance. She is well-developed.  HENT:     Head: Atraumatic.     Nose: Nose normal.     Mouth/Throat:     Mouth: Mucous membranes are moist.  Eyes:     General: No scleral icterus.    Conjunctiva/sclera: Conjunctivae normal.     Pupils: Pupils are equal, round, and reactive to light.  Neck:     Trachea: No tracheal deviation.  Cardiovascular:     Rate and Rhythm: Normal rate and regular rhythm.     Pulses: Normal pulses.     Heart sounds: Normal heart sounds. No murmur heard.    No friction rub. No gallop.  Pulmonary:     Effort: Pulmonary effort is normal. No respiratory distress.     Breath sounds: Normal breath sounds.  Chest:     Chest wall: Tenderness present.  Abdominal:     General: Bowel sounds are normal. There is no distension.     Palpations: Abdomen is soft.     Tenderness: There is no abdominal tenderness.  Genitourinary:    Comments: No cva tenderness.  Musculoskeletal:        General: No swelling or tenderness.     Cervical back: Normal range of motion and neck supple. No rigidity or tenderness. No muscular tenderness.     Right lower leg: No edema.     Left lower leg: No edema.  Skin:    General: Skin is warm and dry.     Findings: No rash.  Neurological:     Mental Status: She is alert.     Comments: Alert, speech normal. Left weakness 4+/5.   Psychiatric:        Mood and Affect: Mood normal.     ED Results / Procedures / Treatments   Labs (all labs ordered are listed, but only abnormal results are displayed) Results for orders placed or performed during the hospital encounter of 04/12/22  CBC  Result Value Ref Range   WBC 7.8 4.0 - 10.5 K/uL   RBC 3.70 (L) 3.87 - 5.11 MIL/uL   Hemoglobin 7.9 (L) 12.0 - 15.0 g/dL   HCT 27.4 (L) 36.0 - 46.0 %   MCV 74.1 (L) 80.0 - 100.0 fL   MCH 21.4 (L) 26.0 - 34.0 pg   MCHC 28.8 (L) 30.0 - 36.0 g/dL   RDW 22.2 (H) 11.5 - 15.5 %   Platelets 390 150 - 400 K/uL   nRBC 0.0 0.0 - 0.2 %   Basic metabolic panel  Result Value Ref Range   Sodium 139 135 - 145 mmol/L   Potassium 3.4 (L) 3.5 - 5.1 mmol/L   Chloride 109 98 - 111 mmol/L   CO2 22 22 - 32 mmol/L   Glucose,  Bld 100 (H) 70 - 99 mg/dL   BUN 19 6 - 20 mg/dL   Creatinine, Ser 0.77 0.44 - 1.00 mg/dL   Calcium 8.5 (L) 8.9 - 10.3 mg/dL   GFR, Estimated >60 >60 mL/min   Anion gap 8 5 - 15  POC CBG, ED  Result Value Ref Range   Glucose-Capillary 116 (H) 70 - 99 mg/dL  Troponin I (High Sensitivity)  Result Value Ref Range   Troponin I (High Sensitivity) 7 <18 ng/L   DG Chest Port 1 View  Result Date: 04/12/2022 CLINICAL DATA:  46 year old female with chest pain and shortness of breath since last night. EXAM: PORTABLE CHEST 1 VIEW COMPARISON:  Portable chest 02/28/2022 and earlier. FINDINGS: Portable AP semi upright view at 0913 hours. Lower lung volumes. Stable mild cardiomegaly. Other mediastinal contours are within normal limits. Visualized tracheal air column is within normal limits. Allowing for portable technique the lungs are clear. No pneumothorax or pleural effusion. Negative visible bowel gas and osseous structures. IMPRESSION: Lower lung volumes.  No acute cardiopulmonary abnormality. Electronically Signed   By: Genevie Ann M.D.   On: 04/12/2022 09:24   CT Head Wo Contrast  Result Date: 04/06/2022 CLINICAL DATA:  Dizziness EXAM: CT HEAD WITHOUT CONTRAST TECHNIQUE: Contiguous axial images were obtained from the base of the skull through the vertex without intravenous contrast. RADIATION DOSE REDUCTION: This exam was performed according to the departmental dose-optimization program which includes automated exposure control, adjustment of the mA and/or kV according to patient size and/or use of iterative reconstruction technique. COMPARISON:  Recent CT and MR imaging of the head 03/30/2022 FINDINGS: Brain: No evidence of acute infarction, hemorrhage, hydrocephalus, extra-axial collection or mass lesion/mass effect. Focal  encephalomalacia in the medial right occipital lobe again noted. Known cluster of acute infarcts in the left frontoparietal vertex and right medial parietal lobe better demonstrated on prior MR imaging. Vascular: No hyperdense vessel or unexpected calcification. Skull: Normal. Negative for fracture or focal lesion. Sinuses/Orbits: No acute finding. Other: None. IMPRESSION: 1. No new acute intracranial abnormality. 2. Cluster of known acute infarcts in the left frontoparietal vertex and right medial parietal lobe better demonstrated on recent prior MR imaging. 3. Sequelae of remote infarct to the medial right occipital lobe again noted. Electronically Signed   By: Jacqulynn Cadet M.D.   On: 04/06/2022 08:20   DG Abd 1 View  Result Date: 04/02/2022 CLINICAL DATA:  Abdominal pain EXAM: ABDOMEN - 1 VIEW COMPARISON:  Abdominal radiographs 06/26/2021 FINDINGS: Gas-filled loops of colon at the upper limits of normal in caliber. No definite obstruction. No acute osseous abnormality. No radiopaque calculi. IMPRESSION: Gas-filled loops of colon at the upper limits of normal in caliber. No definite obstruction. Electronically Signed   By: Placido Sou M.D.   On: 04/02/2022 23:44   VAS [place] LOWER EXTREMITY VENOUS (DVT)  Result Date: 04/01/2022  Lower Venous DVT Study Patient Name:  JALICIA ROSZAK  Date of Exam:   04/01/2022 Medical Rec #: [id number]          Accession #:    [id number] Date of Birth: July 14, 1975          Patient Gender: F Patient Age:   52 years Exam Location:  [facility] Procedure:      VAS [place] LOWER EXTREMITY VENOUS (DVT) Referring Phys: Cornelius Moras XU --------------------------------------------------------------------------------  Indications: Stroke.  Comparison Study: No previous exams Performing Technologist: Jody Hill RVT, RDMS  Examination Guidelines: A complete evaluation includes B-mode imaging, spectral  Doppler, color Doppler, and power Doppler as needed of all accessible portions of  each vessel. Bilateral testing is considered an integral part of a complete examination. Limited examinations for reoccurring indications may be performed as noted. The reflux portion of the exam is performed with the patient in reverse Trendelenburg.  +---------+---------------+---------+-----------+----------+--------------+ RIGHT    CompressibilityPhasicitySpontaneityPropertiesThrombus Aging +---------+---------------+---------+-----------+----------+--------------+ CFV      Full           Yes      Yes                                 +---------+---------------+---------+-----------+----------+--------------+ SFJ      Full                                                        +---------+---------------+---------+-----------+----------+--------------+ FV Prox  Full           Yes      Yes                                 +---------+---------------+---------+-----------+----------+--------------+ FV Mid   Full           Yes      Yes                                 +---------+---------------+---------+-----------+----------+--------------+ FV DistalFull           Yes      Yes                                 +---------+---------------+---------+-----------+----------+--------------+ PFV      Full                                                        +---------+---------------+---------+-----------+----------+--------------+ POP      Full           Yes      Yes                                 +---------+---------------+---------+-----------+----------+--------------+ PTV      Full                                                        +---------+---------------+---------+-----------+----------+--------------+ PERO     Full                                                        +---------+---------------+---------+-----------+----------+--------------+   +---------+---------------+---------+-----------+----------+--------------+ LEFT      CompressibilityPhasicitySpontaneityPropertiesThrombus Aging +---------+---------------+---------+-----------+----------+--------------+ CFV  Full           Yes      Yes                                 +---------+---------------+---------+-----------+----------+--------------+ SFJ      Full                                                        +---------+---------------+---------+-----------+----------+--------------+ FV Prox  Full           Yes      Yes                                 +---------+---------------+---------+-----------+----------+--------------+ FV Mid   Full           Yes      Yes                                 +---------+---------------+---------+-----------+----------+--------------+ FV DistalFull           Yes      Yes                                 +---------+---------------+---------+-----------+----------+--------------+ PFV      Full                                                        +---------+---------------+---------+-----------+----------+--------------+ POP      Full           Yes      Yes                                 +---------+---------------+---------+-----------+----------+--------------+ PTV      Full                                                        +---------+---------------+---------+-----------+----------+--------------+ PERO     Full                                                        +---------+---------------+---------+-----------+----------+--------------+     Summary: BILATERAL: - No evidence of deep vein thrombosis seen in the lower extremities, bilaterally. -No evidence of popliteal cyst, bilaterally.   *See table(s) above for measurements and observations. Electronically signed by Servando Snare MD on 04/01/2022 at 3:27:04 PM.    Final    CT ANGIO HEAD NECK W WO CM  Addendum Date: 03/31/2022   ADDENDUM REPORT: 03/31/2022 19:06 ADDENDUM: Moderate to severe stenosis in the distal A2  or  proximal A3 (series 6, image 255). The distal ACAs are somewhat irregular. As described in the body of the report, there is approximately 70% stenosis in the mid right extracranial ICA. These findings were discussed by telephone on 03/31/2022 at 7:00 pm with provider XU. Electronically Signed   By: Merilyn Baba M.D.   On: 03/31/2022 19:06   Result Date: 03/31/2022 CLINICAL DATA:  Weakness and falls EXAM: CT ANGIOGRAPHY HEAD AND NECK TECHNIQUE: Multidetector CT imaging of the head and neck was performed using the standard protocol during bolus administration of intravenous contrast. Multiplanar CT image reconstructions and MIPs were obtained to evaluate the vascular anatomy. Carotid stenosis measurements (when applicable) are obtained utilizing NASCET criteria, using the distal internal carotid diameter as the denominator. RADIATION DOSE REDUCTION: This exam was performed according to the departmental dose-optimization program which includes automated exposure control, adjustment of the mA and/or kV according to patient size and/or use of iterative reconstruction technique. CONTRAST:  24m OMNIPAQUE IOHEXOL 350 MG/ML SOLN COMPARISON:  None Available. FINDINGS: CTA NECK FINDINGS SKELETON: There is no bony spinal canal stenosis. No lytic or blastic lesion. OTHER NECK: Normal pharynx, larynx and major salivary glands. No cervical lymphadenopathy. Unremarkable thyroid gland. UPPER CHEST: No pneumothorax or pleural effusion. No nodules or masses. AORTIC ARCH: There is no calcific atherosclerosis of the aortic arch. There is no aneurysm, dissection or hemodynamically significant stenosis of the visualized portion of the aorta. Conventional 3 vessel aortic branching pattern. The visualized proximal subclavian arteries are widely patent. RIGHT CAROTID SYSTEM: There is an area of approximately 70% stenosis of the mid right ICA (5:17, at the location of marked curvature. LEFT CAROTID SYSTEM: Normal without aneurysm,  dissection or stenosis. VERTEBRAL ARTERIES: Left dominant configuration. Both origins are clearly patent. There is no dissection, occlusion or flow-limiting stenosis to the skull base (V1-V3 segments). CTA HEAD FINDINGS POSTERIOR CIRCULATION: --Vertebral arteries: Normal V4 segments. --Inferior cerebellar arteries: Normal. --Basilar artery: Normal. --Superior cerebellar arteries: Normal. --Posterior cerebral arteries (PCA): There is moderate stenosis of the right PCA distal P1 segment, severe stenosis of the proximal P2 segment and occlusion of proximal P3 segment. Left PCA is unremarkable. ANTERIOR CIRCULATION: --Intracranial internal carotid arteries: Normal. --Anterior cerebral arteries (ACA): Normal. Both A1 segments are present. Patent anterior communicating artery (a-comm). --Middle cerebral arteries (MCA): Normal. VENOUS SINUSES: As permitted by contrast timing, patent. ANATOMIC VARIANTS: None Review of the MIP images confirms the above findings. 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 Electronically Signed: By: KUlyses JarredM.D. On: 03/30/2022 20:22   ECHOCARDIOGRAM COMPLETE BUBBLE STUDY  Result Date: 03/31/2022    ECHOCARDIOGRAM REPORT   Patient Name:   Kaitlyn DULADate of Exam: 03/31/2022 Medical Rec #:  [id number]        Height:       64.0 in Accession #:    [id number]       Weight:       200.0 lb Date of Birth:  [birth year]        BSA:          1.956 m Patient Age:    89years          BP:           166/100 mmHg Patient Gender: F                 HR:  56 bpm. Exam Location:  Inpatient Procedure: 2D Echo, 3D Echo, Cardiac Doppler, Color Doppler and Saline Contrast            Bubble Study Indications:    Stroke  History:        Patient has prior history of Echocardiogram examinations, most                 recent 09/07/2020. TIA; Risk Factors:Hypertension, Diabetes and                  Current Smoker.  Sonographer:    Eartha Inch Referring Phys: Donnetta Simpers  Sonographer Comments: Image acquisition challenging due to patient body habitus, Image acquisition challenging due to uncooperative patient and Image acquisition challenging due to respiratory motion. IMPRESSIONS  1. Left ventricular ejection fraction, by estimation, is 70 to 75%. The left ventricle has hyperdynamic function. The left ventricle has no regional wall motion abnormalities. There is moderate concentric left ventricular hypertrophy. Left ventricular diastolic parameters are consistent with Grade II diastolic dysfunction (pseudonormalization).  2. Right ventricular systolic function is normal. The right ventricular size is normal.  3. Left atrial size was mildly dilated.  4. The mitral valve is normal in structure. Trivial mitral valve regurgitation. No evidence of mitral stenosis.  5. The aortic valve is tricuspid. Aortic valve regurgitation is mild. No aortic stenosis is present.  6. The inferior vena cava is normal in size with greater than 50% respiratory variability, suggesting right atrial pressure of 3 mmHg.  7. Agitated saline contrast bubble study was negative, with no evidence of any interatrial shunt. Comparison(s): No significant change from prior study. Prior images reviewed side by side. Conclusion(s)/Recommendation(s): No intracardiac source of embolism detected on this transthoracic study. Consider a transesophageal echocardiogram to exclude cardiac source of embolism if clinically indicated. FINDINGS  Left Ventricle: Left ventricular ejection fraction, by estimation, is 70 to 75%. The left ventricle has hyperdynamic function. The left ventricle has no regional wall motion abnormalities. 3D left ventricular ejection fraction analysis performed but not  reported based on interpreter judgement due to suboptimal tracking. The left ventricular internal cavity size was normal in size. There is moderate  concentric left ventricular hypertrophy. Left ventricular diastolic parameters are consistent with Grade II diastolic dysfunction (pseudonormalization). Right Ventricle: The right ventricular size is normal. No increase in right ventricular wall thickness. Right ventricular systolic function is normal. Left Atrium: Left atrial size was mildly dilated. Right Atrium: Right atrial size was normal in size. Pericardium: There is no evidence of pericardial effusion. Mitral Valve: The mitral valve is normal in structure. Trivial mitral valve regurgitation. No evidence of mitral valve stenosis. Tricuspid Valve: The tricuspid valve is normal in structure. Tricuspid valve regurgitation is trivial. Aortic Valve: The aortic valve is tricuspid. Aortic valve regurgitation is mild. Aortic regurgitation PHT measures 460 msec. No aortic stenosis is present. Pulmonic Valve: The pulmonic valve was normal in structure. Pulmonic valve regurgitation is trivial. Aorta: The aortic root and ascending aorta are structurally normal, with no evidence of dilitation. Venous: The inferior vena cava is normal in size with greater than 50% respiratory variability, suggesting right atrial pressure of 3 mmHg. IAS/Shunts: The atrial septum is grossly normal. Agitated saline contrast was given intravenously to evaluate for intracardiac shunting. Agitated saline contrast bubble study was negative, with no evidence of any interatrial shunt.  LEFT VENTRICLE PLAX 2D LVIDd:         4.70 cm   Diastology LVIDs:  2.70 cm   LV e' medial:    6.85 cm/s LV PW:         1.40 cm   LV E/e' medial:  11.7 LV IVS:        1.10 cm   LV e' lateral:   7.29 cm/s LVOT diam:     2.10 cm   LV E/e' lateral: 11.0 LV SV:         94 LV SV Index:   48 LVOT Area:     3.46 cm                           3D Volume EF:                          3D EF:        52 %                          LV EDV:       141 ml                          LV ESV:       67 ml                          LV  SV:        74 ml RIGHT VENTRICLE RV S prime:     14.90 cm/s TAPSE (M-mode): 2.6 cm LEFT ATRIUM           Index        RIGHT ATRIUM           Index LA diam:      4.10 cm 2.10 cm/m   RA Area:     14.30 cm LA Vol (A4C): 60.4 ml 30.87 ml/m  RA Volume:   32.20 ml  16.46 ml/m  AORTIC VALVE LVOT Vmax:   127.00 cm/s LVOT Vmean:  83.500 cm/s LVOT VTI:    0.272 m AI PHT:      460 msec  AORTA Ao Root diam: 2.80 cm Ao Asc diam:  3.40 cm MITRAL VALVE MV Area (PHT): 2.90 cm    SHUNTS MV Decel Time: 262 msec    Systemic VTI:  0.27 m MV E velocity: 80.20 cm/s  Systemic Diam: 2.10 cm MV A velocity: 57.60 cm/s MV E/A ratio:  1.39 Gwyndolyn Kaufman MD Electronically signed by Gwyndolyn Kaufman MD Signature Date/Time: 03/31/2022/11:06:16 AM    Final    MR BRAIN WO CONTRAST  Result Date: 03/30/2022 CLINICAL DATA:  Neuro deficit, acute, stroke suspected. Generalized weakness on the left. EXAM: MRI HEAD WITHOUT CONTRAST TECHNIQUE: Multiplanar, multiecho pulse sequences of the brain and surrounding structures were obtained without intravenous contrast. COMPARISON:  Head CT earlier same day.  MRI 01/27/2022. FINDINGS: Brain: Diffusion imaging shows a cluster of acute infarctions at the left frontoparietal vertex. There are a few punctate acute infarctions in the right medial parietal lobe. Findings may represent anterior cerebral artery territory insults. No large confluent infarction. Elsewhere, old infarctions in the pons and right cerebellar peduncle appear unchanged. Old infarction affecting the right occipital cortical and subcortical brain. Chronic small-vessel ischemic changes of the white matter are unchanged. No evidence of acute hemorrhage, hydrocephalus or extra-axial collection. Vascular: Major vessels at the base of the brain show flow. Skull  and upper cervical spine: Negative Sinuses/Orbits: No advanced inflammatory change.  Orbits negative. Other: None IMPRESSION: 1. Cluster of acute infarctions at the left  frontoparietal vertex. Punctate acute infarctions in the right medial parietal lobe. Findings may represent anterior cerebral artery territory insults. No large confluent infarction. No hemorrhage or mass effect. 2. Old infarctions in the pons, right cerebellar peduncle and right occipital cortical and subcortical brain. Chronic small-vessel ischemic changes of the hemispheric white matter. Electronically Signed   By: Nelson Chimes M.D.   On: 03/30/2022 17:11   CT Head Wo Contrast  Result Date: 03/30/2022 CLINICAL DATA:  46 year old female history of generalized weakness. Fall. Dysarthria. EXAM: CT HEAD WITHOUT CONTRAST TECHNIQUE: Contiguous axial images were obtained from the base of the skull through the vertex without intravenous contrast. RADIATION DOSE REDUCTION: This exam was performed according to the departmental dose-optimization program which includes automated exposure control, adjustment of the mA and/or kV according to patient size and/or use of iterative reconstruction technique. COMPARISON:  Head CT 02/28/2022. FINDINGS: Brain: Encephalomalacia in the medial aspect of the right occipital lobe, unchanged. No evidence of acute infarction, hemorrhage, hydrocephalus, extra-axial collection or mass lesion/mass effect. Vascular: No hyperdense vessel or unexpected calcification. Skull: Normal. Negative for fracture or focal lesion. Sinuses/Orbits: No acute finding. Other: None. IMPRESSION: 1. No acute intracranial abnormalities. 2. Remote right occipital infarct, unchanged. Electronically Signed   By: Vinnie Langton M.D.   On: 03/30/2022 12:22    EKG EKG Interpretation  Date/Time:  Saturday April 12 2022 12:05:47 EDT Ventricular Rate:  74 PR Interval:  146 QRS Duration: 86 QT Interval:  441 QTC Calculation: 490 R Axis:   17 Text Interpretation: Sinus rhythm Borderline prolonged QT interval Nonspecific T wave abnormality Baseline wander Confirmed by Lajean Saver [phone]) on 04/12/2022  12:13:36 PM  Radiology DG Chest Port 1 View  Result Date: 04/12/2022 CLINICAL DATA:  46 year old female with chest pain and shortness of breath since last night. EXAM: PORTABLE CHEST 1 VIEW COMPARISON:  Portable chest 02/28/2022 and earlier. FINDINGS: Portable AP semi upright view at 0913 hours. Lower lung volumes. Stable mild cardiomegaly. Other mediastinal contours are within normal limits. Visualized tracheal air column is within normal limits. Allowing for portable technique the lungs are clear. No pneumothorax or pleural effusion. Negative visible bowel gas and osseous structures. IMPRESSION: Lower lung volumes.  No acute cardiopulmonary abnormality. Electronically Signed   By: Genevie Ann M.D.   On: 04/12/2022 09:24    Procedures Procedures    Medications Ordered in ED Medications  alum & mag hydroxide-simeth (MAALOX/MYLANTA) 200-200-20 MG/5ML suspension 30 mL (30 mLs Oral Patient Refused/Not Given 04/12/22 0921)  potassium chloride (KLOR-CON) packet 40 mEq (40 mEq Oral Patient Refused/Not Given 04/12/22 0921)  aspirin EC tablet 81 mg (81 mg Oral Given 04/12/22 1059)  clopidogrel (PLAVIX) tablet 75 mg (has no administration in time range)  ferrous sulfate tablet 325 mg (325 mg Oral Given 04/12/22 1059)  gabapentin (NEURONTIN) capsule 400 mg (400 mg Oral Given 04/12/22 1059)  glipiZIDE (GLUCOTROL XL) 24 hr tablet 10 mg (has no administration in time range)  losartan (COZAAR) tablet 50 mg (50 mg Oral Given 04/12/22 1057)  metFORMIN (GLUCOPHAGE-XR) 24 hr tablet 500 mg (has no administration in time range)  QUEtiapine (SEROQUEL) tablet 200 mg (has no administration in time range)  rosuvastatin (CRESTOR) tablet 40 mg (40 mg Oral Given 04/12/22 1100)  famotidine (PEPCID) tablet 20 mg (20 mg Oral Given 04/12/22 0921)  acetaminophen (TYLENOL) tablet 1,000 mg (1,000  mg Oral Given 04/12/22 6269)    ED Course/ Medical Decision Making/ A&P                           Medical Decision  Making Problems Addressed: Chronic anemia: chronic illness or injury that poses a threat to life or bodily functions Essential hypertension: chronic illness or injury with exacerbation, progression, or side effects of treatment that poses a threat to life or bodily functions Gait abnormality: acute illness or injury with systemic symptoms History of recent stroke: acute illness or injury with systemic symptoms that poses a threat to life or bodily functions History of substance use disorder: chronic illness or injury Precordial chest pain: acute illness or injury with systemic symptoms that poses a threat to life or bodily functions  Amount and/or Complexity of Data Reviewed Independent Historian: EMS    Details: hx External Data Reviewed: radiology and notes. Labs: ordered. Decision-making details documented in ED Course. Radiology: ordered and independent interpretation performed. Decision-making details documented in ED Course. ECG/medicine tests: ordered and independent interpretation performed. Decision-making details documented in ED Course. Discussion of management or test interpretation with external provider(s): Hospitalists, discussed pt.   Risk OTC drugs. Prescription drug management. Decision regarding hospitalization.   Iv ns. Continuous pulse ox and cardiac monitoring. Labs ordered/sent. Imaging ordered.   Diff dx includes acs, msk cp, gi cp, etc - dispo decision including potential need for admission considered - will get labs and imaging and reassess.  Reviewed nursing notes and prior charts for additional history. External reports reviewed. Additional history from: EMS.  Acetaminophen, pepcid, maalox po.   Cardiac monitor: sinus rhythm, rate 74.  Labs reviewed/interpreted by me - trop normal - after symptoms for past day, felt not c/w acs.  Chr anemia, hgb c/w baseline. K sl low, kcl po.   Xrays reviewed/interpreted by me - no pna.   Given pt recently left  hospital AMA, and feels needs SNF/rehab, will consult hospitalists for possible re-admission. Discussed pt with Dr Marylyn Ishihara and he checked with his inpatient team and utilization review - he indicates medically pt's workup was completed and that pt would just need SW/TOC consult re possible rehab placement.   TOC consulted for placement into facility/rehab. Will continue home meds.   Recheck pt, no chest pain or discomfort.          Final Clinical Impression(s) / ED Diagnoses Final diagnoses:  Precordial chest pain  History of recent stroke  Essential hypertension  History of substance use disorder  Gait abnormality  Chronic anemia    Rx / DC Orders ED Discharge Orders     None          Lajean Saver, MD 04/12/22 1214

## 2022-04-13 LAB — CBG MONITORING, ED
Glucose-Capillary: 105 mg/dL — ABNORMAL HIGH (ref 70–99)
Glucose-Capillary: 86 mg/dL (ref 70–99)

## 2022-04-13 MED ORDER — AMLODIPINE BESYLATE 5 MG PO TABS
5.0000 mg | ORAL_TABLET | Freq: Every day | ORAL | Status: DC
  Administered 2022-04-13: 5 mg via ORAL
  Filled 2022-04-13: qty 1

## 2022-04-13 NOTE — ED Notes (Addendum)
PT REFUSED VITALS INFORMED THE NURSE, AND TRYING TO LEAVE

## 2022-04-13 NOTE — Progress Notes (Signed)
CSW received consult for skilled nursing facility placement.  PT consult was initiated on 04/11/2022, recommending short-term skilled nursing facility placement for rehabilitative services.  Patient then left hospital AMA.  Undetermined as to whether or not new PT consult needs to be initiated, for insurance, reimbursement and placement purposes.  TOC CSW completed patient's FL-2 Form and faxed to skilled nursing facilities of interest on 04/10/2022.  No bed offers received.  CSW re-faxed patient's FL-2 Form to all skilled nursing facilities, within a 50 mile radius, on 04/12/2022.  Bed offers pending, upon admissions coordinators return to the office on 04/14/2022.  CSW will continue to follow.  Nat Christen, BSW, MSW, LCSW  Licensed Education officer, [facility]  Mailing [place] [address] [place], [place] [address] Physical Address-[address], [place], [place] [address] Toll Free Main # [phone] Fax # [phone] Cell # [phone] [email or web address]

## 2022-04-13 NOTE — ED Provider Notes (Addendum)
Emergency Medicine Observation Re-evaluation Note  Kaitlyn Gray is a 46 y.o. female, seen on rounds today.  Pt initially presented to the ED for complaints of Chest Pain Currently, the patient is holding the ED for possible skilled nursing facility.  Physical Exam  BP (!) 147/95   Pulse (!) 57   Temp 98 F (36.7 C) (Oral)   Resp 16   Ht 1.626 m (5\' 4" )   Wt 85 kg   SpO2 100%   BMI 32.17 kg/m  Physical Exam General: Resting no acute distress Cardiac: Regular rate Lungs: Normal effort Psych:   ED Course / MDM  EKG:EKG Interpretation  Date/Time:  Saturday April 12 2022 12:05:47 EDT Ventricular Rate:  74 PR Interval:  146 QRS Duration: 86 QT Interval:  441 QTC Calculation: 490 R Axis:   17 Text Interpretation: Sinus rhythm Borderline prolonged QT interval Nonspecific T wave abnormality Baseline wander Confirmed by Lajean Saver [phone]) on 04/12/2022 12:13:36 PM  I have reviewed the labs performed to date as well as medications administered while in observation.  Recent changes in the last 24 hours include No changes overnight.  Plan  Current plan is for [facility] assessment for possible SNF placement.    Dorie Rank, MD 04/13/22 [phone] I was notified at approximately 2 PM but the patient left the emergency department on her own.  Patient was not here under involuntary commitment.  She was holding for skilled nursing facility placement   Dorie Rank, MD 04/13/22 1358

## 2022-04-14 LAB — TRANSFUSION REACTION
DAT C3: NEGATIVE
Post RXN DAT IgG: NEGATIVE

## 2022-04-21 NOTE — ED Provider Notes (Signed)
[facility] EMERGENCY DEPARTMENT Provider Note   CSN: [id number] Arrival date & time: 02/28/22  0755     History  Chief Complaint  Patient presents with   Fall   Head Injury   Chest Pain    Kaitlyn Gray is a 46 y.o. female.  Kaitlyn Gray is a 46 y.o. female with hx of bipolar disorder, HTN, stroke, polysubstance abuse, who presents via EMS for evaluation of chest pain. Pain started at 4:00 AM. Pt received 324 mg of Aspirin with EMS. Pt was initially agitated on arrival to triage, but my evaluation pt is calm and able to answer questions. Pt reports she woke with chest pain described as left sided. Not worse with exertion, no pleuritic. Chest pain sometimes worse with movement. Pt also reports a mechanical fall this morning and that she hit her head. Hx of frequent falls due to instability. Pt reports chronic weakness in the left upper and lower extremity due to prior stroke. No new neuro deficits. No syncope or LOC. No cough, fever or shortness of breath. No LE swelling.  The history is provided by the patient and medical records.  Fall Associated symptoms include chest pain. Pertinent negatives include no abdominal pain, no headaches and no shortness of breath.  Head Injury Associated symptoms: no headaches, no nausea, no neck pain, no numbness and no vomiting   Chest Pain Associated symptoms: weakness (Chronic)   Associated symptoms: no abdominal pain, no back pain, no cough, no dizziness, no headache, no nausea, no numbness, no shortness of breath and no vomiting        Home Medications Prior to Admission medications   Medication Sig Start Date End Date Taking? Authorizing Provider  gabapentin (NEURONTIN) 400 MG capsule Take 1 capsule (400 mg total) by mouth 2 (two) times daily. 09/13/20  Yes Nita Sells, MD  amLODipine (NORVASC) 5 MG tablet Take 1 tablet (5 mg total) by mouth daily. 04/11/22 04/11/23  Mercy Riding, MD  aspirin EC 81 MG  tablet Take 1 tablet (81 mg total) by mouth daily. Swallow whole. 04/05/22   August Albino, MD  blood glucose meter kit and supplies Relion Prime or Dispense other brand based on patient and insurance preference. Use up to four times daily as directed. (FOR ICD-9 250.00, 250.01). 12/07/17   Roxan Hockey, MD  clopidogrel (PLAVIX) 75 MG tablet Take 1 tablet (75 mg total) by mouth daily. 04/05/22   August Albino, MD  ferrous sulfate 325 (65 FE) MG tablet Take 1 tablet (325 mg total) by mouth every other day. 04/05/22   August Albino, MD  glipiZIDE (GLUCOTROL XL) 10 MG 24 hr tablet Take 10 mg by mouth daily. 10/17/21   [provider]  Insulin Pen Needle 31G X 5 MM MISC Use as directed 12/07/17   Roxan Hockey, MD  losartan (COZAAR) 50 MG tablet Take 1 tablet (50 mg total) by mouth daily. 04/11/22   Mercy Riding, MD  metFORMIN (GLUCOPHAGE-XR) 500 MG 24 hr tablet Take 1 tablet (500 mg total) by mouth 2 (two) times daily. 04/11/22   Mercy Riding, MD  QUEtiapine (SEROQUEL) 200 MG tablet Take 200 mg by mouth at bedtime. 08/31/20   [provider]  rosuvastatin (CRESTOR) 40 MG tablet Take 1 tablet (40 mg total) by mouth daily. 04/05/22   August Albino, MD      Allergies    Hydrocodone and Latex    Review of Systems   Review of Systems  Respiratory:  Negative for cough and shortness of breath.   Cardiovascular:  Positive for chest pain.  Gastrointestinal:  Negative for abdominal pain, nausea and vomiting.  Genitourinary:  Negative for dysuria and frequency.  Musculoskeletal:  Negative for arthralgias, back pain, myalgias and neck pain.  Neurological:  Positive for weakness (Chronic). Negative for dizziness, syncope, light-headedness, numbness and headaches.    Physical Exam Updated Vital Signs BP (!) 141/59   Pulse 63   Temp 98.2 F (36.8 C) (Oral)   Resp 18   SpO2 100%  Physical Exam Vitals and nursing note reviewed.  Constitutional:      General: She is not in acute  distress.    Appearance: Normal appearance. She is well-developed. She is not diaphoretic.  HENT:     Head: Normocephalic and atraumatic.  Eyes:     General:        Right eye: No discharge.        Left eye: No discharge.     Pupils: Pupils are equal, round, and reactive to light.  Cardiovascular:     Rate and Rhythm: Normal rate and regular rhythm.     Pulses: Normal pulses.     Heart sounds: Normal heart sounds.  Pulmonary:     Effort: Pulmonary effort is normal. No respiratory distress.     Breath sounds: Normal breath sounds. No wheezing or rales.     Comments: Respirations equal and unlabored, patient able to speak in full sentences, lungs clear to auscultation bilaterally  Abdominal:     General: Bowel sounds are normal. There is no distension.     Palpations: Abdomen is soft. There is no mass.     Tenderness: There is no abdominal tenderness. There is no guarding.     Comments: Abdomen soft, nondistended, nontender to palpation in all quadrants without guarding or peritoneal signs  Musculoskeletal:        General: No deformity.     Cervical back: Neck supple.  Skin:    General: Skin is warm and dry.     Capillary Refill: Capillary refill takes less than 2 seconds.  Neurological:     Mental Status: She is alert and oriented to person, place, and time.     Coordination: Coordination normal.     Comments: Speech is clear, able to follow commands CN III-XII intact Chronic left sided weakness in upper and lower extremity, no right sided weakness Sensation normal to light and sharp touch Moves extremities without ataxia, coordination intact  Psychiatric:        Mood and Affect: Mood normal.        Behavior: Behavior normal.     ED Results / Procedures / Treatments   Labs (all labs ordered are listed, but only abnormal results are displayed) Labs Reviewed  BASIC METABOLIC PANEL - Abnormal; Notable for the following components:      Result Value   Glucose, Bld 101 (*)     All other components within normal limits  CBC WITH DIFFERENTIAL/PLATELET - Abnormal; Notable for the following components:   RBC 3.69 (*)    Hemoglobin 7.8 (*)    HCT 27.1 (*)    MCV 73.4 (*)    MCH 21.1 (*)    MCHC 28.8 (*)    RDW 18.9 (*)    All other components within normal limits  TROPONIN I (HIGH SENSITIVITY)  TROPONIN I (HIGH SENSITIVITY)    EKG EKG Interpretation  Date/Time:  Friday February 28 2022 10:11:49 EDT Ventricular  Rate:  58 PR Interval:  147 QRS Duration: 102 QT Interval:  459 QTC Calculation: 451 R Axis:   48 Text Interpretation: Sinus rhythm Borderline ST elevation, anterior leads Confirmed by Malvin Johns [phone]) on 02/28/2022 11:21:36 AM  Radiology  CT Head Wo Contrast  Result Date: 02/28/2022 CLINICAL DATA:  Fall. EXAM: CT HEAD WITHOUT CONTRAST CT CERVICAL SPINE WITHOUT CONTRAST TECHNIQUE: Multidetector CT imaging of the head and cervical spine was performed following the standard protocol without intravenous contrast. Multiplanar CT image reconstructions of the cervical spine were also generated. RADIATION DOSE REDUCTION: This exam was performed according to the departmental dose-optimization program which includes automated exposure control, adjustment of the mA and/or kV according to patient size and/or use of iterative reconstruction technique. COMPARISON:  MRI brain and CT head dated January 27, 2022. CTA neck dated Oct 16, 2021. FINDINGS: CT HEAD FINDINGS Brain: No evidence of acute infarction, hemorrhage, hydrocephalus, extra-axial collection or mass lesion/mass effect. Chronic small infarcts in the right occipital lobe and right pons again noted. Stable mild-to-moderate chronic microvascular ischemic changes. Vascular: No hyperdense vessel or unexpected calcification. Skull: Normal. Negative for fracture or focal lesion. Sinuses/Orbits: No acute finding. Old right medial orbital wall fracture again noted. Other: None. CT CERVICAL SPINE FINDINGS  Alignment: Straightening of the normal cervical lordosis. No traumatic malalignment. Skull base and vertebrae: No acute fracture. No primary bone lesion or focal pathologic process. Soft tissues and spinal canal: No prevertebral fluid or swelling. No visible canal hematoma. Retropharyngeal course of both internal carotid arteries again noted. Disc levels:  Unchanged mild multilevel disc height loss. Upper chest: Negative. Other: None. IMPRESSION: 1. No acute intracranial abnormality. Chronic small infarcts in the right occipital lobe and right pons. 2. No acute cervical spine fracture or traumatic malalignment. Electronically Signed   By: Titus Dubin M.D.   On: 02/28/2022 10:09   CT Cervical Spine Wo Contrast  Result Date: 02/28/2022 CLINICAL DATA:  Fall. EXAM: CT HEAD WITHOUT CONTRAST CT CERVICAL SPINE WITHOUT CONTRAST TECHNIQUE: Multidetector CT imaging of the head and cervical spine was performed following the standard protocol without intravenous contrast. Multiplanar CT image reconstructions of the cervical spine were also generated. RADIATION DOSE REDUCTION: This exam was performed according to the departmental dose-optimization program which includes automated exposure control, adjustment of the mA and/or kV according to patient size and/or use of iterative reconstruction technique. COMPARISON:  MRI brain and CT head dated January 27, 2022. CTA neck dated Oct 16, 2021. FINDINGS: CT HEAD FINDINGS Brain: No evidence of acute infarction, hemorrhage, hydrocephalus, extra-axial collection or mass lesion/mass effect. Chronic small infarcts in the right occipital lobe and right pons again noted. Stable mild-to-moderate chronic microvascular ischemic changes. Vascular: No hyperdense vessel or unexpected calcification. Skull: Normal. Negative for fracture or focal lesion. Sinuses/Orbits: No acute finding. Old right medial orbital wall fracture again noted. Other: None. CT CERVICAL SPINE FINDINGS Alignment:  Straightening of the normal cervical lordosis. No traumatic malalignment. Skull base and vertebrae: No acute fracture. No primary bone lesion or focal pathologic process. Soft tissues and spinal canal: No prevertebral fluid or swelling. No visible canal hematoma. Retropharyngeal course of both internal carotid arteries again noted. Disc levels:  Unchanged mild multilevel disc height loss. Upper chest: Negative. Other: None. IMPRESSION: 1. No acute intracranial abnormality. Chronic small infarcts in the right occipital lobe and right pons. 2. No acute cervical spine fracture or traumatic malalignment. Electronically Signed   By: Titus Dubin M.D.   On: 02/28/2022 10:09  DG Chest 1 View  Result Date: 02/28/2022 CLINICAL DATA:  Chest pain. EXAM: CHEST  1 VIEW COMPARISON:  December 26, 2021. FINDINGS: Stable cardiomediastinal silhouette. Both lungs are clear. The visualized skeletal structures are unremarkable. IMPRESSION: No active disease. Electronically Signed   By: Marijo Conception M.D.   On: 02/28/2022 09:44   DG Shoulder Right  Result Date: 02/28/2022 CLINICAL DATA:  Right shoulder pain after fall today. EXAM: RIGHT SHOULDER - 2+ VIEW COMPARISON:  None Available. FINDINGS: There is no evidence of fracture or dislocation. Mild degenerative changes seen involving the right glenohumeral joint. Soft tissues are unremarkable. 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 No acute abnormality seen. Electronically Signed   By: Marijo Conception M.D.   On: 02/28/2022 09:42    Procedures Procedures    Medications Ordered in ED Medications  acetaminophen (TYLENOL) tablet 1,000 mg (1,000 mg Oral Given 02/28/22 1026)    ED Course/ Medical Decision Making/ A&P                           Medical Decision Making Amount and/or Complexity of Data Reviewed Labs: ordered. Radiology: ordered.  Risk OTC drugs.   46 y.o. female presents to the ED with complaints of Chest pain, fall, this  involves an extensive number of treatment options, and is a complaint that carries with it a high risk of complications and morbidity.  The differential diagnosis includes ACS, dissection, PNA, URI, Musculoskeletal chest pain, GERD, PE  On arrival pt is nontoxic, vitals hypertensive, but vitals otherwise normal.   Additional history obtained from chart review. Previous records obtained and reviewed   I ordered medication including tylenol for pain  Lab Tests:  I Ordered, reviewed, and interpreted labs, which included: Stable anemia, no leukocytosis, no electrolyte derangements and normal renal function, trop neg x2  Imaging Studies ordered:  I ordered imaging studies which included x rays of chest and right shoulder, CT head and c-spine, I independently visualized and interpreted imaging which showed no active cardiopulmonary disease, shoulder without fracture or dislocation, CT without intracranial bleeding or fracture, no c-spine fracture or malalignment  ED Course:   EKG without obvious acute ischemia, delta troponin negative, doubt ACS. Patient is low risk wells, PERC negative, doubt pulmonary embolism. Pain is not a tearing sensation, symmetric pulses, no widening of mediastinum on CXR, doubt dissection. Cardiac monitor reviewed, no notable arrhythmias or tachycardia   Based on patient's chief complaint, I considered admission might be necessary, however after reassuring ED workup feel patient is reasonable for discharge.   I discussed results, treatment plan, need for PCP follow-up, and return precautions with the patient. Provided opportunity for questions, patient confirmed understanding and is in agreement with plan.    Social determinants of health: substance abuse, mental health   Portions of this note were generated with L[employer]. Dictation errors may occur despite best attempts at proofreading.         Final Clinical Impression(s) / ED Diagnoses Final  diagnoses:  Fall, initial encounter  Injury of head, initial encounter  Chest tightness    Rx / DC Orders ED Discharge Orders     None         Jacqlyn Larsen, [place] 04/21/22 2633    Malvin Johns, MD 04/25/22 0930

## 2022-04-22 ENCOUNTER — Other Ambulatory Visit: Payer: Self-pay

## 2022-04-22 ENCOUNTER — Encounter (HOSPITAL_COMMUNITY): Payer: Self-pay | Admitting: Emergency Medicine

## 2022-04-22 ENCOUNTER — Emergency Department (HOSPITAL_COMMUNITY)
Admission: EM | Admit: 2022-04-22 | Discharge: 2022-04-23 | Discharge status: Against Medical Advice | Payer: Medicaid Other | Attending: Emergency Medicine | Admitting: Emergency Medicine

## 2022-04-22 DIAGNOSIS — Z5321 Procedure and treatment not carried out due to patient leaving prior to being seen by health care provider: Secondary | ICD-10-CM | POA: Insufficient documentation

## 2022-04-22 DIAGNOSIS — R5383 Other fatigue: Secondary | ICD-10-CM | POA: Diagnosis not present

## 2022-04-22 DIAGNOSIS — R531 Weakness: Secondary | ICD-10-CM | POA: Diagnosis present

## 2022-04-22 NOTE — ED Notes (Signed)
Pt left triage and went to the lobby before being screened by the provider, when staff went to look for her, the pt was no longer in the lobby.

## 2022-04-22 NOTE — ED Triage Notes (Signed)
Patient arrived with EMS from home reports " doesn't feel well" , generalized weakness/fatigue this evening .

## 2022-04-23 ENCOUNTER — Telehealth: Payer: Self-pay

## 2022-04-23 NOTE — Telephone Encounter (Signed)
This RNCM reached out to [facility] financial assistance team for assistance with Medicaid and disability application on 04/10/22 for this patient. Patient has since left AMA, however needed assistance with Medicaid coverage for SNF placement. Here's the outcome of the financial navigators research:  We have reviewed the account below. Patient currently has [place]- full Medicaid.  Patient has SNF benefits under her current Medicaid program, it is unknown how much of this benefit this patient is entitled to under this program.   Patient can still pursue and apply for disability- which it is strongly advised should her condition worsen, and her needs exceed her current benefits.

## 2022-04-23 NOTE — ED Notes (Signed)
X2 no response °

## 2022-04-29 ENCOUNTER — Encounter (HOSPITAL_COMMUNITY): Payer: Self-pay

## 2022-04-29 ENCOUNTER — Emergency Department (HOSPITAL_COMMUNITY)
Admission: EM | Admit: 2022-04-29 | Discharge: 2022-04-30 | Discharge status: Against Medical Advice | Payer: Medicaid Other | Attending: Medical | Admitting: Medical

## 2022-04-29 ENCOUNTER — Other Ambulatory Visit: Payer: Self-pay

## 2022-04-29 ENCOUNTER — Emergency Department (HOSPITAL_COMMUNITY): Payer: Medicaid Other

## 2022-04-29 DIAGNOSIS — R531 Weakness: Secondary | ICD-10-CM | POA: Diagnosis present

## 2022-04-29 DIAGNOSIS — Z5321 Procedure and treatment not carried out due to patient leaving prior to being seen by health care provider: Secondary | ICD-10-CM | POA: Diagnosis not present

## 2022-04-29 DIAGNOSIS — Z7902 Long term (current) use of antithrombotics/antiplatelets: Secondary | ICD-10-CM | POA: Insufficient documentation

## 2022-04-29 LAB — COMPREHENSIVE METABOLIC PANEL
ALT: 11 U/L (ref 0–44)
AST: 14 U/L — ABNORMAL LOW (ref 15–41)
Albumin: 3.8 g/dL (ref 3.5–5.0)
Alkaline Phosphatase: 55 U/L (ref 38–126)
Anion gap: 11 (ref 5–15)
BUN: 12 mg/dL (ref 6–20)
CO2: 24 mmol/L (ref 22–32)
Calcium: 9.1 mg/dL (ref 8.9–10.3)
Chloride: 104 mmol/L (ref 98–111)
Creatinine, Ser: 0.81 mg/dL (ref 0.44–1.00)
GFR, Estimated: >60 mL/min (ref >60)
Glucose, Bld: 100 mg/dL — ABNORMAL HIGH (ref 70–99)
Potassium: 3.4 mmol/L — ABNORMAL LOW (ref 3.5–5.1)
Sodium: 139 mmol/L (ref 135–145)
Total Bilirubin: 0.3 mg/dL (ref 0.3–1.2)
Total Protein: 7.3 g/dL (ref 6.5–8.1)

## 2022-04-29 LAB — CBC WITH DIFFERENTIAL/PLATELET
Abs Immature Granulocytes: 0 10*3/uL (ref 0.00–0.07)
Basophils Absolute: 0 10*3/uL (ref 0.0–0.1)
Basophils Relative: 0 %
Eosinophils Absolute: 0.1 10*3/uL (ref 0.0–0.5)
Eosinophils Relative: 1 %
HCT: 35.1 % — ABNORMAL LOW (ref 36.0–46.0)
Hemoglobin: 10 g/dL — ABNORMAL LOW (ref 12.0–15.0)
Immature Granulocytes: 0 %
Lymphocytes Relative: 37 %
Lymphs Abs: 1.8 10*3/uL (ref 0.7–4.0)
MCH: 22.6 pg — ABNORMAL LOW (ref 26.0–34.0)
MCHC: 28.5 g/dL — ABNORMAL LOW (ref 30.0–36.0)
MCV: 79.2 fL — ABNORMAL LOW (ref 80.0–100.0)
Monocytes Absolute: 0.4 10*3/uL (ref 0.1–1.0)
Monocytes Relative: 7 %
Neutro Abs: 2.7 10*3/uL (ref 1.7–7.7)
Neutrophils Relative %: 55 %
Platelets: 315 10*3/uL (ref 150–400)
RBC: 4.43 MIL/uL (ref 3.87–5.11)
RDW: 24.1 % — ABNORMAL HIGH (ref 11.5–15.5)
WBC: 5 10*3/uL (ref 4.0–10.5)
nRBC: 0 % (ref 0.0–0.2)

## 2022-04-29 LAB — CBG MONITORING, ED: Glucose-Capillary: 106 mg/dL — ABNORMAL HIGH (ref 70–99)

## 2022-04-29 LAB — TROPONIN I (HIGH SENSITIVITY): Troponin I (High Sensitivity): 6 ng/L (ref <18)

## 2022-04-29 NOTE — ED Provider Triage Note (Signed)
Emergency Medicine Provider Triage Evaluation Note  Kaitlyn Gray , a 46 y.o. female  was evaluated in triage.  Pt complains of feeling weak and having multiple falls today. Denies head trauma. Uses plavix.  Review of Systems  Positive: Headache Negative: Vision changes  Physical Exam  BP (!) 170/93 (BP Location: Right Arm)   Pulse 60   Temp 97.6 F (36.4 C) (Oral)   Resp 18   SpO2 98%  Gen:   Awake, no distress   Resp:  Normal effort  MSK:   Moves extremities without difficulty    Medical Decision Making  Medically screening exam initiated at 8:37 PM.  Appropriate orders placed.  Kaitlyn Gray was informed that the remainder of the evaluation will be completed by another provider, this initial triage assessment does not replace that evaluation, and the importance of remaining in the ED until their evaluation is complete.    Pete Pelt, [place] 04/29/22 2039

## 2022-04-29 NOTE — ED Notes (Signed)
Assisted to restroom but pt says she was unable to collect urine.

## 2022-04-29 NOTE — ED Notes (Signed)
Informed by CT tech that pt refused CT

## 2022-04-29 NOTE — ED Notes (Signed)
Refused EKG

## 2022-04-29 NOTE — ED Triage Notes (Signed)
Arrives EMS from home after multiple falls today. HX CVA with impaired mobility at baseline.

## 2022-04-30 NOTE — ED Notes (Signed)
Unable to find pt in waiting room. 

## 2022-05-01 ENCOUNTER — Inpatient Hospital Stay (HOSPITAL_COMMUNITY)
Admission: EM | Admit: 2022-05-01 | Discharge: 2022-05-06 | DRG: 918 | Disposition: A | Discharge status: Home / Self Care | Payer: Medicaid Other | Attending: Internal Medicine | Admitting: Internal Medicine

## 2022-05-01 ENCOUNTER — Emergency Department (HOSPITAL_COMMUNITY): Payer: Medicaid Other

## 2022-05-01 ENCOUNTER — Other Ambulatory Visit: Payer: Self-pay

## 2022-05-01 ENCOUNTER — Encounter (HOSPITAL_COMMUNITY): Payer: Self-pay | Admitting: Internal Medicine

## 2022-05-01 DIAGNOSIS — Z91410 Personal history of adult physical and sexual abuse: Secondary | ICD-10-CM

## 2022-05-01 DIAGNOSIS — Z7902 Long term (current) use of antithrombotics/antiplatelets: Secondary | ICD-10-CM | POA: Diagnosis not present

## 2022-05-01 DIAGNOSIS — I69354 Hemiplegia and hemiparesis following cerebral infarction affecting left non-dominant side: Secondary | ICD-10-CM | POA: Diagnosis not present

## 2022-05-01 DIAGNOSIS — E1169 Type 2 diabetes mellitus with other specified complication: Secondary | ICD-10-CM | POA: Diagnosis present

## 2022-05-01 DIAGNOSIS — Z79899 Other long term (current) drug therapy: Secondary | ICD-10-CM | POA: Diagnosis not present

## 2022-05-01 DIAGNOSIS — Z91040 Latex allergy status: Secondary | ICD-10-CM | POA: Diagnosis not present

## 2022-05-01 DIAGNOSIS — F17210 Nicotine dependence, cigarettes, uncomplicated: Secondary | ICD-10-CM | POA: Diagnosis present

## 2022-05-01 DIAGNOSIS — I69398 Other sequelae of cerebral infarction: Secondary | ICD-10-CM

## 2022-05-01 DIAGNOSIS — F250 Schizoaffective disorder, bipolar type: Secondary | ICD-10-CM | POA: Diagnosis present

## 2022-05-01 DIAGNOSIS — Z885 Allergy status to narcotic agent status: Secondary | ICD-10-CM | POA: Diagnosis not present

## 2022-05-01 DIAGNOSIS — I69321 Dysphasia following cerebral infarction: Secondary | ICD-10-CM | POA: Diagnosis not present

## 2022-05-01 DIAGNOSIS — K30 Functional dyspepsia: Secondary | ICD-10-CM | POA: Diagnosis not present

## 2022-05-01 DIAGNOSIS — I69320 Aphasia following cerebral infarction: Secondary | ICD-10-CM | POA: Diagnosis not present

## 2022-05-01 DIAGNOSIS — I152 Hypertension secondary to endocrine disorders: Secondary | ICD-10-CM | POA: Diagnosis present

## 2022-05-01 DIAGNOSIS — Z91199 Patient's noncompliance with other medical treatment and regimen due to unspecified reason: Secondary | ICD-10-CM

## 2022-05-01 DIAGNOSIS — F1410 Cocaine abuse, uncomplicated: Secondary | ICD-10-CM | POA: Diagnosis present

## 2022-05-01 DIAGNOSIS — F319 Bipolar disorder, unspecified: Secondary | ICD-10-CM | POA: Diagnosis present

## 2022-05-01 DIAGNOSIS — Z8249 Family history of ischemic heart disease and other diseases of the circulatory system: Secondary | ICD-10-CM

## 2022-05-01 DIAGNOSIS — T405X1A Poisoning by cocaine, accidental (unintentional), initial encounter: Principal | ICD-10-CM | POA: Diagnosis present

## 2022-05-01 DIAGNOSIS — Z7982 Long term (current) use of aspirin: Secondary | ICD-10-CM

## 2022-05-01 DIAGNOSIS — R296 Repeated falls: Secondary | ICD-10-CM | POA: Diagnosis present

## 2022-05-01 DIAGNOSIS — R2689 Other abnormalities of gait and mobility: Secondary | ICD-10-CM | POA: Diagnosis present

## 2022-05-01 DIAGNOSIS — F1910 Other psychoactive substance abuse, uncomplicated: Secondary | ICD-10-CM | POA: Diagnosis present

## 2022-05-01 DIAGNOSIS — Z7984 Long term (current) use of oral hypoglycemic drugs: Secondary | ICD-10-CM | POA: Diagnosis not present

## 2022-05-01 DIAGNOSIS — E119 Type 2 diabetes mellitus without complications: Secondary | ICD-10-CM

## 2022-05-01 DIAGNOSIS — R262 Difficulty in walking, not elsewhere classified: Secondary | ICD-10-CM

## 2022-05-01 DIAGNOSIS — I63523 Cerebral infarction due to unspecified occlusion or stenosis of bilateral anterior cerebral arteries: Secondary | ICD-10-CM | POA: Diagnosis not present

## 2022-05-01 DIAGNOSIS — I69322 Dysarthria following cerebral infarction: Secondary | ICD-10-CM

## 2022-05-01 DIAGNOSIS — E1159 Type 2 diabetes mellitus with other circulatory complications: Secondary | ICD-10-CM | POA: Diagnosis present

## 2022-05-01 DIAGNOSIS — I10 Essential (primary) hypertension: Secondary | ICD-10-CM | POA: Diagnosis present

## 2022-05-01 DIAGNOSIS — I639 Cerebral infarction, unspecified: Secondary | ICD-10-CM

## 2022-05-01 LAB — CBC
HCT: 32.2 % — ABNORMAL LOW (ref 36.0–46.0)
Hemoglobin: 9.6 g/dL — ABNORMAL LOW (ref 12.0–15.0)
MCH: 23.1 pg — ABNORMAL LOW (ref 26.0–34.0)
MCHC: 29.8 g/dL — ABNORMAL LOW (ref 30.0–36.0)
MCV: 77.6 fL — ABNORMAL LOW (ref 80.0–100.0)
Platelets: 249 10*3/uL (ref 150–400)
RBC: 4.15 MIL/uL (ref 3.87–5.11)
RDW: 23.6 % — ABNORMAL HIGH (ref 11.5–15.5)
WBC: 4.3 10*3/uL (ref 4.0–10.5)
nRBC: 0 % (ref 0.0–0.2)

## 2022-05-01 LAB — COMPREHENSIVE METABOLIC PANEL
ALT: 12 U/L (ref 0–44)
AST: 15 U/L (ref 15–41)
Albumin: 3.5 g/dL (ref 3.5–5.0)
Alkaline Phosphatase: 56 U/L (ref 38–126)
Anion gap: 7 (ref 5–15)
BUN: 18 mg/dL (ref 6–20)
CO2: 27 mmol/L (ref 22–32)
Calcium: 9 mg/dL (ref 8.9–10.3)
Chloride: 108 mmol/L (ref 98–111)
Creatinine, Ser: 1 mg/dL (ref 0.44–1.00)
GFR, Estimated: >60 mL/min (ref >60)
Glucose, Bld: 111 mg/dL — ABNORMAL HIGH (ref 70–99)
Potassium: 3.4 mmol/L — ABNORMAL LOW (ref 3.5–5.1)
Sodium: 142 mmol/L (ref 135–145)
Total Bilirubin: 0.5 mg/dL (ref 0.3–1.2)
Total Protein: 6.8 g/dL (ref 6.5–8.1)

## 2022-05-01 LAB — DIFFERENTIAL
Abs Immature Granulocytes: 0 10*3/uL (ref 0.00–0.07)
Basophils Absolute: 0 10*3/uL (ref 0.0–0.1)
Basophils Relative: 1 %
Eosinophils Absolute: 0.2 10*3/uL (ref 0.0–0.5)
Eosinophils Relative: 4 %
Lymphocytes Relative: 34 %
Lymphs Abs: 1.5 10*3/uL (ref 0.7–4.0)
Monocytes Absolute: 0.3 10*3/uL (ref 0.1–1.0)
Monocytes Relative: 6 %
Neutro Abs: 2.4 10*3/uL (ref 1.7–7.7)
Neutrophils Relative %: 55 %
nRBC: 0 /100 WBC

## 2022-05-01 LAB — I-STAT CHEM 8, ED
BUN: 19 mg/dL (ref 6–20)
Calcium, Ion: 1.18 mmol/L (ref 1.15–1.40)
Chloride: 105 mmol/L (ref 98–111)
Creatinine, Ser: 0.9 mg/dL (ref 0.44–1.00)
Glucose, Bld: 109 mg/dL — ABNORMAL HIGH (ref 70–99)
HCT: 34 % — ABNORMAL LOW (ref 36.0–46.0)
Hemoglobin: 11.6 g/dL — ABNORMAL LOW (ref 12.0–15.0)
Potassium: 3.4 mmol/L — ABNORMAL LOW (ref 3.5–5.1)
Sodium: 142 mmol/L (ref 135–145)
TCO2: 29 mmol/L (ref 22–32)

## 2022-05-01 LAB — RAPID URINE DRUG SCREEN, HOSP PERFORMED
Amphetamines: NOT DETECTED
Barbiturates: NOT DETECTED
Benzodiazepines: NOT DETECTED
Cocaine: POSITIVE — AB
Opiates: NOT DETECTED
Tetrahydrocannabinol: NOT DETECTED

## 2022-05-01 LAB — I-STAT BETA HCG BLOOD, ED (MC, WL, AP ONLY): I-stat hCG, quantitative: <5 m[IU]/mL (ref <5)

## 2022-05-01 LAB — ETHANOL: Alcohol, Ethyl (B): <10 mg/dL (ref <10)

## 2022-05-01 LAB — APTT: aPTT: 31 seconds (ref 24–36)

## 2022-05-01 LAB — PROTIME-INR
INR: 1.1 (ref 0.8–1.2)
Prothrombin Time: 14.2 seconds (ref 11.4–15.2)

## 2022-05-01 LAB — GLUCOSE, CAPILLARY: Glucose-Capillary: 170 mg/dL — ABNORMAL HIGH (ref 70–99)

## 2022-05-01 MED ORDER — ASPIRIN 81 MG PO CHEW
324.0000 mg | CHEWABLE_TABLET | Freq: Once | ORAL | Status: AC
  Administered 2022-05-01: 324 mg via ORAL
  Filled 2022-05-01: qty 4

## 2022-05-01 MED ORDER — SODIUM CHLORIDE 0.9 % IV SOLN: 250.0000 mL | INTRAVENOUS | Status: DC | PRN

## 2022-05-01 MED ORDER — AMLODIPINE BESYLATE 5 MG PO TABS
5.0000 mg | ORAL_TABLET | Freq: Every day | ORAL | Status: DC
  Administered 2022-05-01 – 2022-05-04 (×4): 5 mg via ORAL
  Filled 2022-05-01 (×4): qty 1

## 2022-05-01 MED ORDER — ASPIRIN 81 MG PO TBEC
81.0000 mg | DELAYED_RELEASE_TABLET | Freq: Every day | ORAL | Status: DC
  Administered 2022-05-02 – 2022-05-06 (×5): 81 mg via ORAL
  Filled 2022-05-01 (×5): qty 1

## 2022-05-01 MED ORDER — ACETAMINOPHEN 325 MG PO TABS
650.0000 mg | ORAL_TABLET | Freq: Four times a day (QID) | ORAL | Status: DC | PRN
  Administered 2022-05-02: 650 mg via ORAL
  Filled 2022-05-01: qty 2

## 2022-05-01 MED ORDER — NICOTINE 14 MG/24HR TD PT24
14.0000 mg | MEDICATED_PATCH | Freq: Every day | TRANSDERMAL | Status: DC
  Filled 2022-05-01 (×4): qty 1

## 2022-05-01 MED ORDER — CLOPIDOGREL BISULFATE 75 MG PO TABS
75.0000 mg | ORAL_TABLET | Freq: Once | ORAL | Status: AC
  Administered 2022-05-01: 75 mg via ORAL
  Filled 2022-05-01: qty 1

## 2022-05-01 MED ORDER — ENOXAPARIN SODIUM 40 MG/0.4ML IJ SOSY
40.0000 mg | PREFILLED_SYRINGE | INTRAMUSCULAR | Status: DC
  Administered 2022-05-01 – 2022-05-05 (×5): 40 mg via SUBCUTANEOUS
  Filled 2022-05-01 (×5): qty 0.4

## 2022-05-01 MED ORDER — SODIUM CHLORIDE 0.9% FLUSH: 3.0000 mL | INTRAVENOUS | Status: DC | PRN

## 2022-05-01 MED ORDER — GABAPENTIN 400 MG PO CAPS
400.0000 mg | ORAL_CAPSULE | Freq: Two times a day (BID) | ORAL | Status: DC
  Administered 2022-05-01 – 2022-05-06 (×10): 400 mg via ORAL
  Filled 2022-05-01 (×10): qty 1

## 2022-05-01 MED ORDER — SODIUM CHLORIDE 0.9% FLUSH
3.0000 mL | Freq: Two times a day (BID) | INTRAVENOUS | Status: DC
  Administered 2022-05-01 – 2022-05-05 (×9): 3 mL via INTRAVENOUS

## 2022-05-01 MED ORDER — TRAZODONE HCL 50 MG PO TABS
25.0000 mg | ORAL_TABLET | Freq: Every evening | ORAL | Status: DC | PRN
  Administered 2022-05-01 – 2022-05-05 (×2): 25 mg via ORAL
  Filled 2022-05-01 (×3): qty 1

## 2022-05-01 MED ORDER — ACETAMINOPHEN 650 MG RE SUPP: 650.0000 mg | Freq: Four times a day (QID) | RECTAL | Status: DC | PRN

## 2022-05-01 MED ORDER — LORAZEPAM 2 MG/ML IJ SOLN
INTRAMUSCULAR | Status: AC
  Administered 2022-05-01: 1 mg via INTRAVENOUS
  Filled 2022-05-01: qty 1

## 2022-05-01 MED ORDER — SENNA 8.6 MG PO TABS
1.0000 | ORAL_TABLET | Freq: Two times a day (BID) | ORAL | Status: DC
  Administered 2022-05-01 – 2022-05-06 (×10): 8.6 mg via ORAL
  Filled 2022-05-01 (×10): qty 1

## 2022-05-01 MED ORDER — CLOPIDOGREL BISULFATE 75 MG PO TABS
75.0000 mg | ORAL_TABLET | Freq: Every day | ORAL | Status: DC
  Administered 2022-05-01 – 2022-05-06 (×6): 75 mg via ORAL
  Filled 2022-05-01 (×6): qty 1

## 2022-05-01 MED ORDER — QUETIAPINE FUMARATE 100 MG PO TABS
200.0000 mg | ORAL_TABLET | Freq: Every day | ORAL | Status: DC
  Administered 2022-05-01 – 2022-05-05 (×5): 200 mg via ORAL
  Filled 2022-05-01 (×5): qty 2

## 2022-05-01 MED ORDER — LOSARTAN POTASSIUM 50 MG PO TABS
50.0000 mg | ORAL_TABLET | Freq: Every day | ORAL | Status: DC
  Administered 2022-05-01 – 2022-05-05 (×5): 50 mg via ORAL
  Filled 2022-05-01 (×5): qty 1

## 2022-05-01 MED ORDER — ROSUVASTATIN CALCIUM 20 MG PO TABS
40.0000 mg | ORAL_TABLET | Freq: Every day | ORAL | Status: DC
  Administered 2022-05-01 – 2022-05-06 (×6): 40 mg via ORAL
  Filled 2022-05-01 (×6): qty 2

## 2022-05-01 MED ORDER — INSULIN ASPART 100 UNIT/ML IJ SOLN
0.0000 [IU] | Freq: Three times a day (TID) | INTRAMUSCULAR | Status: DC
  Administered 2022-05-02: 2 [IU] via SUBCUTANEOUS
  Administered 2022-05-02 – 2022-05-04 (×3): 3 [IU] via SUBCUTANEOUS
  Administered 2022-05-04: 2 [IU] via SUBCUTANEOUS
  Administered 2022-05-05 (×2): 3 [IU] via SUBCUTANEOUS

## 2022-05-01 MED ORDER — SODIUM CHLORIDE 0.9% FLUSH: 3.0000 mL | Freq: Once | INTRAVENOUS | Status: DC

## 2022-05-01 MED ORDER — FERROUS SULFATE 325 (65 FE) MG PO TABS
325.0000 mg | ORAL_TABLET | ORAL | Status: DC
  Administered 2022-05-02 – 2022-05-06 (×3): 325 mg via ORAL
  Filled 2022-05-01 (×4): qty 1

## 2022-05-01 MED ORDER — METFORMIN HCL ER 500 MG PO TB24
500.0000 mg | ORAL_TABLET | Freq: Two times a day (BID) | ORAL | Status: DC
  Administered 2022-05-02 – 2022-05-03 (×3): 500 mg via ORAL
  Filled 2022-05-01 (×5): qty 1

## 2022-05-01 MED ORDER — LORAZEPAM 2 MG/ML IJ SOLN: 1.0000 mg | Freq: Once | INTRAMUSCULAR | Status: AC

## 2022-05-01 NOTE — ED Notes (Signed)
Report given and care endorsed to [place] RN 

## 2022-05-01 NOTE — ED Notes (Signed)
Pt room ready on 2W at this time. Awaiting purple man

## 2022-05-01 NOTE — Subjective & Objective (Signed)
46 year old female with significant past medical history which includes recent CVA with MRI of the brain shows cluster of acute infarction of the left frontal parietal vertex, punctate acute infarct of the right medial parietal lobes and old midbrain, cerebellar and cerebral infarct, along with CTA of the head and neck show significant intracranial and extracranial stenosis. On 10/15 discharged. Returned  on 10/20and was awaiting SNF placement - left AMA. Additional history of diabetes, hypertension, polysubstance use including marijuana cocaine tobacco use, noncompliance. She is  brought to [facility] via EMS from home for complaints of increased speech difficulty.  History difficult to obtain due to patient's aphasia.  Patient mention for the past several days she has had difficulty ambulating despite using her walker.  She has fallen multiple times with her legs giving out.  She also noticed more difficulty with her speech since yesterday.  Patient does not think she has any significant injury from her falls

## 2022-05-01 NOTE — Assessment & Plan Note (Signed)
Long standing problem.  Plan Continue Seroquel

## 2022-05-01 NOTE — ED Triage Notes (Signed)
Pt BIB [facility] following a call [facility] follow pt calling for not feeling well. PTAR walked pt on scene and then called [facility] for pt becoming confused.

## 2022-05-01 NOTE — ED Notes (Signed)
Pt reports going to bed last night at 2100. Pt reports waking up this AM at 0700 and "my speech is funning and I fell on my knees when I tried to walk with my walked to my commode." EDP aware.

## 2022-05-01 NOTE — ED Notes (Signed)
Pt provided fork to eat dinner and assisted to sit up to eat dinner.

## 2022-05-01 NOTE — Assessment & Plan Note (Signed)
Hx of polysubstance abuse including THC, cocaine, tobacco.  Plan Prn Xanax for any withdrawal for stimulants  Nicotine patch
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
## 2022-05-01 NOTE — Assessment & Plan Note (Signed)
BP running a bit high.  Plan Continue home meds

## 2022-05-01 NOTE — ED Notes (Signed)
Teletracking placed for patient  

## 2022-05-01 NOTE — Assessment & Plan Note (Signed)
Last A1C 03/30/22 6.6%  Plan Continue metformin  Hold glipizide  ss

## 2022-05-01 NOTE — ED Notes (Signed)
Pt standing at doorway. PT c/o needing to urinate and unable to make it to the bathroom and reports urinating on self. PT assisted back onto stretcher. Urine cleaned up. Pt placed in clean gown and back on continuous monitoring returning from MRI. Pt call bell and belongings in reach. PT requesting something to eat. Pt educated on importance of waiting on MRI. PT noted to have empty can of chicken and spoon at bedside. PT reports eating the canned chicken. Fayrene Helper PA aware.

## 2022-05-01 NOTE — ED Provider Notes (Signed)
[facility] EMERGENCY DEPARTMENT Provider Note   CSN: [id number] Arrival date & time: 05/01/22  0856     History  Chief Complaint  Patient presents with   Altered Mental Status    Kaitlyn Gray is a 46 y.o. female.  The history is provided by the patient, the EMS personnel and medical records. No language interpreter was used.  Altered Mental Status    46 year old female with significant past medical history which includes recent CVA with MRI of the brain shows cluster of acute infarction of the left frontal parietal vertex, punctate acute infarct of the right medial parietal lobes and old midbrain, cerebellar and cerebral infarct, along with CTA of the head and neck show significant intracranial and extracranial stenosis on 10/15 discharged on 10/20, history of diabetes, hypertension, polysubstance use including marijuana cocaine tobacco use, noncompliance, brought here via EMS from home for complaints of speech difficulty.  History difficult to obtain due to patient's aphasia.  Patient mention for the past several days she has had difficulty ambulating despite using her walker.  She has fallen multiple times with her legs giving out.  She also noticed more difficulty with her speech since yesterday.  Patient does not think she has any significant injury from her falls.  She denies any active pain.  She does admits to cocaine use 5 days ago.  She mentions she did not receive any medication to go home when she was discharged several weeks prior for stroke.  She is currently denies having headache, chest pain, trouble breathing, abdominal pain but does endorse increased legs weakness and trouble with her gait.  Home Medications Prior to Admission medications   Medication Sig Start Date End Date Taking? Authorizing Provider  amLODipine (NORVASC) 5 MG tablet Take 1 tablet (5 mg total) by mouth daily. 04/11/22 04/11/23  Mercy Riding, MD  aspirin EC 81 MG tablet Take 1  tablet (81 mg total) by mouth daily. Swallow whole. 04/05/22   August Albino, MD  blood glucose meter kit and supplies Relion Prime or Dispense other brand based on patient and insurance preference. Use up to four times daily as directed. (FOR ICD-9 250.00, 250.01). 12/07/17   Roxan Hockey, MD  clopidogrel (PLAVIX) 75 MG tablet Take 1 tablet (75 mg total) by mouth daily. 04/05/22   August Albino, MD  ferrous sulfate 325 (65 FE) MG tablet Take 1 tablet (325 mg total) by mouth every other day. 04/05/22   August Albino, MD  gabapentin (NEURONTIN) 400 MG capsule Take 1 capsule (400 mg total) by mouth 2 (two) times daily. 09/13/20   Nita Sells, MD  glipiZIDE (GLUCOTROL XL) 10 MG 24 hr tablet Take 10 mg by mouth daily. 10/17/21   [provider]  Insulin Pen Needle 31G X 5 MM MISC Use as directed 12/07/17   Roxan Hockey, MD  losartan (COZAAR) 50 MG tablet Take 1 tablet (50 mg total) by mouth daily. 04/11/22   Mercy Riding, MD  metFORMIN (GLUCOPHAGE-XR) 500 MG 24 hr tablet Take 1 tablet (500 mg total) by mouth 2 (two) times daily. 04/11/22   Mercy Riding, MD  QUEtiapine (SEROQUEL) 200 MG tablet Take 200 mg by mouth at bedtime. 08/31/20   [provider]  rosuvastatin (CRESTOR) 40 MG tablet Take 1 tablet (40 mg total) by mouth daily. 04/05/22   August Albino, MD      Allergies    Hydrocodone and Latex    Review of Systems   Review  of Systems  All other systems reviewed and are negative.   Physical Exam Updated Vital Signs BP (!) 160/90 (BP Location: Right Arm)   Pulse 62   Temp 98.4 F (36.9 C) (Oral)   Resp 11   SpO2 98%  Physical Exam Vitals and nursing note reviewed.  Constitutional:      General: She is not in acute distress.    Appearance: She is well-developed.  HENT:     Head: Atraumatic.  Eyes:     Extraocular Movements: Extraocular movements intact.     Conjunctiva/sclera: Conjunctivae normal.     Pupils: Pupils are equal, round, and reactive to  light.  Cardiovascular:     Rate and Rhythm: Normal rate and regular rhythm.     Pulses: Normal pulses.     Heart sounds: Normal heart sounds.  Pulmonary:     Effort: Pulmonary effort is normal.  Abdominal:     Palpations: Abdomen is soft.  Musculoskeletal:     Cervical back: Neck supple.  Skin:    Findings: No rash.  Neurological:     Mental Status: She is alert.     GCS: GCS eye subscore is 4. GCS verbal subscore is 5. GCS motor subscore is 6.     Cranial Nerves: Dysarthria present.     Sensory: Sensation is intact.     Motor: Weakness, tremor and pronator drift present.     Coordination: Finger-Nose-Finger Test abnormal and Heel to [employer] abnormal.  Psychiatric:        Mood and Affect: Mood normal.     ED Results / Procedures / Treatments   Labs (all labs ordered are listed, but only abnormal results are displayed) Labs Reviewed  CBC - Abnormal; Notable for the following components:      Result Value   Hemoglobin 9.6 (*)    HCT 32.2 (*)    MCV 77.6 (*)    MCH 23.1 (*)    MCHC 29.8 (*)    RDW 23.6 (*)    All other components within normal limits  COMPREHENSIVE METABOLIC PANEL - Abnormal; Notable for the following components:   Potassium 3.4 (*)    Glucose, Bld 111 (*)    All other components within normal limits  I-STAT CHEM 8, ED - Abnormal; Notable for the following components:   Potassium 3.4 (*)    Glucose, Bld 109 (*)    Hemoglobin 11.6 (*)    HCT 34.0 (*)    All other components within normal limits  PROTIME-INR  APTT  DIFFERENTIAL  ETHANOL  RAPID URINE DRUG SCREEN, HOSP PERFORMED  I-STAT BETA HCG BLOOD, ED ([facility], [facility], [facility] ONLY)  CBG MONITORING, ED    EKG EKG Interpretation  Date/Time:  Thursday May 01 2022 09:04:06 EST Ventricular Rate:  57 PR Interval:  98 QRS Duration: 98 QT Interval:  487 QTC Calculation: 475 R Axis:   52 Text Interpretation: Sinus or ectopic atrial rhythm Atrial premature complex Short PR interval Since last tracing  rate slower Confirmed by Dorie Rank [phone]) on 05/01/2022 9:16:01 AM  Radiology MR BRAIN WO CONTRAST  Result Date: 05/01/2022 CLINICAL DATA:  Neuro deficit, stroke suspected. EXAM: MRI HEAD WITHOUT CONTRAST TECHNIQUE: Multiplanar, multiecho pulse sequences of the brain and surrounding structures were obtained without intravenous contrast. COMPARISON:  Same-day CT head, MR head 03/30/2022 FINDINGS: Brain: There is no acute intracranial hemorrhage, extra-axial fluid collection, or acute infarct. Background parenchymal volume is stable. The ventricles are stable in size. There are prior  infarcts in the right occipital lobe, high left frontoparietal cortex near the vertex, pons, and right cerebellar hemisphere/middle cerebellar peduncle. The high left frontoparietal cortical infarcts are late subacute to early chronic (acute on the MRI from 03/30/2022). There is additional patchy and confluence FLAIR signal abnormality in the supratentorial white matter likely reflecting sequela of underlying chronic small-vessel ischemic change, stable. Small foci of chronic blood products in the thalami are unchanged. There is no mass lesion.  There is no mass effect or midline shift. Vascular: Choose Skull and upper cervical spine: Normal marrow signal. Sinuses/Orbits: There is mild mucosal thickening in the paranasal sinuses. The globes and orbits are unremarkable. Other: None. IMPRESSION: 1. No acute intracranial pathology. 2. Late subacute to early chronic infarcts in the high left frontoparietal cortex. Additional remote infarcts and age advanced background chronic small-vessel ischemic change as above. Electronically Signed   By: Valetta Mole M.D.   On: 05/01/2022 14:51   CT HEAD WO CONTRAST  Result Date: 05/01/2022 CLINICAL DATA:  Acute neurologic deficit with altered speech and difficulty walking. EXAM: CT HEAD WITHOUT CONTRAST TECHNIQUE: Contiguous axial images were obtained from the base of the skull through the  vertex without intravenous contrast. RADIATION DOSE REDUCTION: This exam was performed according to the departmental dose-optimization program which includes automated exposure control, adjustment of the mA and/or kV according to patient size and/or use of iterative reconstruction technique. COMPARISON:  04/06/2022 FINDINGS: Brain: Remote right occipital infarct. More recent high left parietal lobe infarcts as shown on MRI from 03/30/2022. The regions of poor gray-white differentiation in the high left parietal region appear roughly similar to those demonstrated on the prior MRI. No new infarct is currently identified. Periventricular white matter and corona radiata hypodensities favor chronic ischemic microvascular white matter disease. Otherwise, the brainstem, cerebellum, cerebral peduncles, thalamus, basal ganglia, basilar cisterns, and ventricular system appear within normal limits. No intracranial hemorrhage, mass lesion, or acute CVA. Vascular: Unremarkable Skull: Unremarkable Sinuses/Orbits: Chronic bilateral maxillary, bilateral ethmoid, and left sphenoid sinusitis. Old healed right medial orbital wall fracture. Other: No supplemental non-categorized findings. IMPRESSION: 1. No acute intracranial findings. 2. Remote right occipital infarct. 3. More recent late subacute/early chronic high left parietal lobe infarcts as shown on MRI from 03/30/2022. 4. Chronic ischemic microvascular white matter disease. 5. Chronic paranasal sinusitis. 6. Old healed right medial orbital wall fracture. Electronically Signed   By: Van Clines M.D.   On: 05/01/2022 10:24    Procedures Procedures    Medications Ordered in ED Medications  sodium chloride flush (NS) 0.9 % injection 3 mL (3 mLs Intravenous Not Given 05/01/22 1006)  aspirin chewable tablet 324 mg (324 mg Oral Given 05/01/22 1058)  clopidogrel (PLAVIX) tablet 75 mg (75 mg Oral Given 05/01/22 1139)  LORazepam (ATIVAN) injection 1 mg (1 mg Intravenous  Given 05/01/22 1319)    ED Course/ Medical Decision Making/ A&P                           Medical Decision Making  BP (!) 160/90 (BP Location: Right Arm)   Pulse 62   Temp 98.4 F (36.9 C) (Oral)   Resp 11   SpO2 98%   74:64 AM 46 year old female with significant past medical history which includes recent CVA with MRI of the brain shows cluster of acute infarction of the left frontal parietal vertex, punctate acute infarct of the right medial parietal lobes and old midbrain, cerebellar and cerebral infarct, along with  CTA of the head and neck show significant intracranial and extracranial stenosis on 10/15 discharged on 10/20, history of diabetes, hypertension, polysubstance use including marijuana cocaine tobacco use, noncompliance, brought here via EMS from home for complaints of speech difficulty.  History difficult to obtain due to patient's aphasia.  Patient mention for the past several days she has had difficulty ambulating despite using her walker.  She has fallen multiple times with her legs giving out.  She also noticed more difficulty with her speech since yesterday.  Patient does not think she has any significant injury from her falls.  She denies any active pain.  She does admits to cocaine use 5 days ago.  She mentions she did not receive any medication to go home when she was discharged several weeks prior for stroke.  She is currently denies having headache, chest pain, trouble breathing, abdominal pain but does endorse increased legs weakness and trouble with her gait.  On exam, patient is laying in bed appears to be in no acute discomfort.  She is eating some crackers.  Speech is dysarthric.  Slight left facial droop noted.  She had very poor effort in moving her extremities with 3/5 strength to left upper and left lower extremity, 4 out of 5 strength to right upper and lower extremities.  No tongue deviation.  Very poor coordination and poor grip strength.  She is alert and  oriented x3.  Appreciate consultation from on-call neurologist Dr. Malen Gauze who recommend repeat brain MRI along with work-up for further assessment.  He agrees with giving patient Plavix and aspirin as patient should be on DAPT  -Labs ordered, independently viewed and interpreted by me.  Labs remarkable for Hgb 9.6 which isn't far off her baseline -The patient was maintained on a cardiac monitor.  I personally viewed and interpreted the cardiac monitored which showed an underlying rhythm of: NSR -Imaging independently viewed and interpreted by me and I agree with radiologist's interpretation.  Result remarkable for brain MRI showing no acute intracrainial pathology.  Late subacute to early chronic infarcts in the high left frontoparieral cortex.   -This patient presents to the ED for concern of neuro deficit, this involves an extensive number of treatment options, and is a complaint that carries with it a high risk of complications and morbidity.  The differential diagnosis includes recrudescence of prior stroke, hypoglycemia, anemia, electrolytes imbalance, drug induced stroke like symptoms -Co morbidities that complicate the patient evaluation includes DM, bipolar, polysubstance use, stroke -Treatment includes ASA, Plavix -Reevaluation of the patient after these medicines showed that the patient stayed the same -PCP office notes or outside notes reviewed -Discussion with specialist neurologist Dr. Rory Percy who recommend hospital admission for PT/OT DAPT, but no need for further management by stroke team -Escalation to admission/observation considered: patients feels comfortable with admission  3:26 PM Repeat brain MRI shows a subacute to chronic stroke involving left frontal parietal region but no new acute stroke.  Labs otherwise reassuring.  Since patient has trouble with dysarthria, with a gait and recurrent falls as well as not having any medical management at home, I appreciate consultation from  [facility] hospitalist and spoke with Dr. Linda Hedges who agrees to admit patient for further care.  I also discussed care with Dr. Rory Percy who reviews the MRI and felt additional management from stroke team is not warranted.         Final Clinical Impression(s) / ED Diagnoses Final diagnoses:  Dysarthria as late effect of stroke  Multiple falls  Rx / DC Orders ED Discharge Orders     None         Domenic Moras, PA-C 05/01/22 1528    Dorie Rank, MD 05/02/22 1524

## 2022-05-01 NOTE — Assessment & Plan Note (Signed)
Patient w/ h/o CVAs. Most recently 04/06/22 where MRI revealed acute infarct fronto-parietal area. She did not complete her evaluation and after discharge was not adherent to her Duel antiplatelet regimen. She had ambulatory difficulty but left hospital before placement could be arranged. Now she returns with progressive expressive dysphasia, gait instability that is worse and falls. MRI at admission with no new infarcts but subacute vs chronic infarcts high left fronto-parietal cortex c/w previous MRI. Her CVA related disability calls for SNF.  Plan Continue ASA and plavix  Risk modification  PT/OT eval  TOC consult re: placement

## 2022-05-01 NOTE — ED Notes (Signed)
Patient transported to CT 

## 2022-05-01 NOTE — ED Notes (Signed)
Pt bed rejected. Admitting provider aware of needed clarification for pt need to have cardiac monitoring. Per provider pt med -surg admission no cardiac monitoring needed.

## 2022-05-01 NOTE — Progress Notes (Signed)
Pt arrived to unit, assisted to bathroom, informed of POC, all needs addressed

## 2022-05-01 NOTE — Assessment & Plan Note (Signed)
>>  ASSESSMENT AND PLAN FOR BIPOLAR DISORDER (HCC) WRITTEN ON 05/01/2022  5:25 PM BY NORINS, MICHAEL E, MD  Long standing problem.  Plan Continue Seroquel

## 2022-05-01 NOTE — ED Notes (Signed)
Katha Hamming, RN said room was ready and ok to send pt up. Teletracking put in now.

## 2022-05-01 NOTE — ED Notes (Signed)
Called 2W for purple man at this time.

## 2022-05-02 ENCOUNTER — Encounter (HOSPITAL_COMMUNITY): Payer: Self-pay | Admitting: Internal Medicine

## 2022-05-02 DIAGNOSIS — I69321 Dysphasia following cerebral infarction: Secondary | ICD-10-CM | POA: Diagnosis not present

## 2022-05-02 DIAGNOSIS — I63523 Cerebral infarction due to unspecified occlusion or stenosis of bilateral anterior cerebral arteries: Secondary | ICD-10-CM

## 2022-05-02 LAB — GLUCOSE, CAPILLARY
Glucose-Capillary: 132 mg/dL — ABNORMAL HIGH (ref 70–99)
Glucose-Capillary: 120 mg/dL — ABNORMAL HIGH (ref 70–99)
Glucose-Capillary: 155 mg/dL — ABNORMAL HIGH (ref 70–99)
Glucose-Capillary: 196 mg/dL — ABNORMAL HIGH (ref 70–99)

## 2022-05-02 MED ORDER — ONDANSETRON HCL 4 MG/2ML IJ SOLN
4.0000 mg | INTRAMUSCULAR | Status: DC | PRN
  Administered 2022-05-02: 4 mg via INTRAVENOUS
  Filled 2022-05-02: qty 2

## 2022-05-02 MED ORDER — POTASSIUM CHLORIDE CRYS ER 20 MEQ PO TBCR
40.0000 meq | EXTENDED_RELEASE_TABLET | Freq: Once | ORAL | Status: AC
  Administered 2022-05-02: 40 meq via ORAL
  Filled 2022-05-02: qty 2

## 2022-05-02 NOTE — Progress Notes (Signed)
Re:  Kaitlyn Gray Date of Birth:  [birth year]  To whom it may concern,  Please be advised that the above-named patient will require a short-term nursing home stay - anticipated 30 days or less for rehabilitation and strengthening.  The plan is for return home.

## 2022-05-02 NOTE — Progress Notes (Signed)
Pt is lethargic and vomiting.. VS and BBS assessed and WNL. Provider made aware. Prn meds ordered for nausea given, bed alarm on, pt encourage to use call bell at all times

## 2022-05-02 NOTE — Progress Notes (Signed)
Mobility Specialist Progress Note:   05/02/22 1220  Mobility  Activity Ambulated with assistance in room;Transferred from chair to bed  Level of Assistance Standby assist, set-up cues, supervision of patient - no hands on  Assistive Device Front wheel walker  Distance Ambulated (ft) 10 ft  Activity Response Tolerated well  Mobility Referral Yes  $Mobility charge 1 Mobility   Pt received in chair and requesting transfer back to bed. No complaints of pain or discomfort. Pt left in bed with all needs met and call bell in reach.   Andrey Campanile Mobility Specialist Please contact via SecureChat or  Rehab office at [phone]
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
## 2022-05-02 NOTE — Evaluation (Signed)
Physical Therapy Evaluation Patient Details Name: Kaitlyn Gray MRN: [id number] DOB: [birth year] Today's Date: 05/02/2022  History of Present Illness  46 yo female admitted 11/16 with slurred speech and falls, cocaine (+). MRI without acute infarcts. PMhx: multiple CVAs with most recent 03/30/22, DM, HTN, polysubstance abuse  Clinical Impression  Pt with slurred speech and required encouragement to mobilize. Pt stating she lives alone in 2 story apartment, has frequent falls and has no family or friends to assist. Pt with ataxic gait and movement with history of falls and high fall risk with lack of awareness and processing who would greatly benefit from ST-sNF to maximize mobility, balance, safety and medication management prior to return home.        Recommendations for follow up therapy are one component of a multi-disciplinary discharge planning process, led by the attending physician.  Recommendations may be updated based on patient status, additional functional criteria and insurance authorization.  Follow Up Recommendations Skilled nursing-short term rehab (<3 hours/day) Can patient physically be transported by private vehicle: Yes    Assistance Recommended at Discharge Frequent or constant Supervision/Assistance  Patient can return home with the following  A lot of help with bathing/dressing/bathroom;Assistance with cooking/housework;Direct supervision/assist for medications management;Direct supervision/assist for financial management;Assist for transportation;Help with stairs or ramp for entrance;A little help with bathing/dressing/bathroom    Equipment Recommendations Rollator (4 wheels)  Recommendations for Other Services  OT consult    Functional Status Assessment Patient has had a recent decline in their functional status and/or demonstrates limited ability to make significant improvements in function in a reasonable and predictable amount of time     Precautions /  Restrictions Precautions Precautions: Fall Precaution Comments: pt reports >10 falls in 6 mo      Mobility  Bed Mobility Overal bed mobility: Needs Assistance Bed Mobility: Supine to Sit           General bed mobility comments: min assist to initiate and fully elevate trunk from surface    Transfers Overall transfer level: Needs assistance   Transfers: Sit to/from Stand Sit to Stand: Min guard           General transfer comment: minguard to rise from recliner and toilet with rail    Ambulation/Gait Ambulation/Gait assistance: Min assist Gait Distance (Feet): 130 Feet Assistive device: Rollator (4 wheels) Gait Pattern/deviations: Step-through pattern, Trunk flexed, Ataxic, Narrow base of support   Gait velocity interpretation: <1.8 ft/sec, indicate of risk for recurrent falls   General Gait Details: pt with ataxic gait throughout with partial knee buckling at times, impaired control of bil LE and trunk with gait, needing cues for direction. pt with limited tolerance due to fatigue  Stairs            Wheelchair Mobility    Modified Rankin (Stroke Patients Only)       Balance Overall balance assessment: Needs assistance   Sitting balance-Leahy Scale: Fair Sitting balance - Comments: static sitting at toilet and EOB   Standing balance support: Bilateral upper extremity supported, Reliant on assistive device for balance Standing balance-Leahy Scale: Poor Standing balance comment: needs UE support to maintain standing balance                             Pertinent Vitals/Pain Pain Assessment Pain Assessment: No/denies pain    Home Living Family/patient expects to be discharged to:: Private residence Living Arrangements: Alone   Type of Home: Apartment Home  Access: Stairs to enter   [employer] of Steps: 6 Alternate Level Stairs-Number of Steps: 14 Home Layout: Two level;Bed/bath upstairs Home Equipment: Rollator (4  wheels) Additional Comments: rollator with missing brakes    Prior Function Prior Level of Function : Needs assist             Mobility Comments: rollator for gait ADLs Comments: reports lack of transportation for shopping, unable to manage medication and reports difficulty with household management     Hand Dominance        Extremity/Trunk Assessment   Upper Extremity Assessment Upper Extremity Assessment: Generalized weakness    Lower Extremity Assessment Lower Extremity Assessment: Generalized weakness RLE Coordination: decreased gross motor LLE Coordination: decreased gross motor    Cervical / Trunk Assessment Cervical / Trunk Exceptions: pt with ataxic movement of trunk and bil LE with gait, grinding teeth  Communication   Communication: Expressive difficulties  Cognition Arousal/Alertness: Awake/alert Behavior During Therapy: Flat affect Overall Cognitive Status: No family/caregiver present to determine baseline cognitive functioning Area of Impairment: Orientation, Safety/judgement, Memory, Problem solving                 Orientation Level: Disoriented to, Time, Place   Memory: Decreased short-term memory   Safety/Judgement: Decreased awareness of safety, Decreased awareness of deficits   Problem Solving: Difficulty sequencing, Requires verbal cues, Requires tactile cues, Slow processing, Decreased initiation General Comments: pt not oriented to month or day, slow processing and lack of awareness for soiled linens with trash all over floor        General Comments      Exercises     Assessment/Plan    PT Assessment Patient needs continued PT services  PT Problem List Decreased strength;Decreased range of motion;Decreased activity tolerance;Decreased balance;Decreased mobility;Decreased coordination;Cardiopulmonary status limiting activity;Decreased safety awareness;Decreased knowledge of use of DME;Decreased cognition       PT Treatment  Interventions Gait training;Stair training;Functional mobility training;Therapeutic activities;Wheelchair mobility training;Patient/family education;Cognitive remediation;Neuromuscular re-education;Balance training;DME instruction;Therapeutic exercise    PT Goals (Current goals can be found in the Care Plan section)  Acute Rehab PT Goals Patient Stated Goal: go to rehab PT Goal Formulation: With patient Time For Goal Achievement: 05/16/22 Potential to Achieve Goals: Fair    Frequency Min 2X/week     Co-evaluation               AM-PAC PT "6 Clicks" Mobility  Outcome Measure Help needed turning from your back to your side while in a flat bed without using bedrails?: None Help needed moving from lying on your back to sitting on the side of a flat bed without using bedrails?: None Help needed moving to and from a bed to a chair (including a wheelchair)?: A Little Help needed standing up from a chair using your arms (e.g., wheelchair or bedside chair)?: A Little Help needed to walk in hospital room?: A Little Help needed climbing 3-5 steps with a railing? : Total 6 Click Score: 18    End of Session Equipment Utilized During Treatment: Gait belt Activity Tolerance: Patient tolerated treatment well Patient left: in chair;with call bell/phone within reach (no chair alarm pads on unit) Nurse Communication: Mobility status PT Visit Diagnosis: Unsteadiness on feet (R26.81);Other abnormalities of gait and mobility (R26.89);Muscle weakness (generalized) (M62.81);History of falling (Z91.81);Ataxic gait (R26.0)    Time: 5038-8828 PT Time Calculation (min) (ACUTE ONLY): 25 min   Charges:   PT Evaluation $PT Eval Moderate Complexity: 1 Mod  Merryl Hacker, PT Acute Rehabilitation Services Office: [phone]   Enedina Finner Amelianna Meller 05/02/2022, 1:03 PM

## 2022-05-02 NOTE — Progress Notes (Signed)
PROGRESS NOTE    Kaitlyn Gray  ZOX:[id number] DOB: [birth year] DOA: 05/01/2022 PCP: Patient, No Pcp Per    Chief Complaint  Patient presents with   Altered Mental Status    Brief Narrative:   46 year old female with significant past medical history which includes recent CVA with MRI of the brain shows cluster of acute infarction of the left frontal parietal vertex, punctate acute infarct of the right medial parietal lobes and old midbrain, cerebellar and cerebral infarct, along with CTA of the head and neck show significant intracranial and extracranial stenosis. On 10/15 discharged. Returned  on 10/20and was awaiting SNF placement - left AMA. Additional history of diabetes, hypertension, polysubstance use including marijuana cocaine tobacco use, noncompliance. She is  brought to [facility] via EMS from home for complaints of increased speech difficulty.  History difficult to obtain due to patient's aphasia.  Patient mention for the past several days she has had difficulty ambulating despite using her walker.  She has fallen multiple times with her legs giving out.  She also noticed more difficulty with her speech since yesterday.  Patient does not think she has any significant injury from her falls her MRI was significant for acute CVA.   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 -Is noncompliant with any of her medications. -Still with polysubstance abuse, positive for cocaine which likely contributed to this acute CVA. -She had recent work-up including CTA which was significant for sclerosis with a right mid ICA 70% stenosis. -No need to  repeat work-up, will start on aspirin and Plavix, to continue for 3 months then Plavix alone. -Started on statin. -PT/OT/SLP reconsulted, likely will need SNF placement  Hypertension associated with diabetes (HCC) - Continue home meds   Type 2 diabetes mellitus (HCC) - Last A1C 03/30/22 6.6% - Continue metformin, hold glipizide, continue with insulin sliding scale            Polysubstance abuse (HCC) - Hx of polysubstance abuse including THC, cocaine, tobacco. - Prn Xanax for any withdrawal for stimulants  - Nicotine patch   Bipolar disorder (HCC) - Long standing problem. - Continue Seroquel       DVT prophylaxis: Lovenox Code Status: Full Family Communication: none at bedside Disposition: likely will need SNF, awaiting PT/OT  Status is: Inpatient    Consultants:  None   Subjective:  Nausea, no vomiting, she denies any changing of her presenting neurological deficits Objective: Vitals:   05/01/22 1953 05/01/22 2031 05/01/22 2052 05/02/22 0421  BP: (!) 155/82 (!) 165/76 (!) 165/76 (!) 153/77  Pulse: 70 70 70 68  Resp: (!) 23 19 19 15   Temp:  98 F (36.7 C) 98 F (36.7 C) 97.9 F (36.6 C)  TempSrc:  Oral Oral   SpO2: 98% 99%  98%  Weight:   84.8 kg   Height:   5\' 4"  (1.626 m)     Intake/Output Summary (Last 24 hours) at 05/02/2022 1114 Last data filed at 05/02/2022 0744 Gross per 24 hour  Intake --  Output 200 ml  Net -200 ml   Filed Weights   05/01/22 2052  Weight: 84.8 kg  Examination:  Awake Alert, Oriented X 3,  Slurred speech, intelligible Symmetrical Chest wall movement, Good air movement bilaterally, CTAB RRR,No Gallops,Rubs or new Murmurs, No Parasternal Heave +ve B.Sounds, Abd Soft, No tenderness, No rebound - guarding or rigidity. No Cyanosis, Clubbing or edema, No new Rash or bruise      Data Reviewed: I have personally reviewed following labs and imaging studies  CBC: Recent Labs  Lab 04/29/22 2056 05/01/22 1028  05/01/22 1038  WBC 5.0 4.3  --   NEUTROABS 2.7 2.4  --   HGB 10.0* 9.6* 11.6*  HCT 35.1* 32.2* 34.0*  MCV 79.2* 77.6*  --   PLT 315 249  --     Basic Metabolic Panel: Recent Labs  Lab 04/29/22 2056 05/01/22 1028 05/01/22 1038  NA 139 142 142  K 3.4* 3.4* 3.4*  CL 104 108 105  CO2 24 27  --   GLUCOSE 100* 111* 109*  BUN 12 18 19   CREATININE 0.81 1.00 0.90  CALCIUM 9.1 9.0  --     GFR: Estimated Creatinine Clearance: 81.4 mL/min (by C-G formula based on SCr of 0.9 mg/dL).  Liver Function Tests: Recent Labs  Lab 04/29/22 2056 05/01/22 1028  AST 14* 15  ALT 11 12  ALKPHOS 55 56  BILITOT 0.3 0.5  PROT 7.3 6.8  ALBUMIN 3.8 3.5    CBG: Recent Labs  Lab 04/29/22 2056 05/01/22 2037 05/02/22 0739  GLUCAP 106* 170* 132*     No results found for this or any previous visit (from the past 240 hour(s)).       Radiology Studies: MR BRAIN WO CONTRAST  Result Date: 05/01/2022 CLINICAL DATA:  Neuro deficit, stroke suspected. EXAM: MRI HEAD WITHOUT CONTRAST TECHNIQUE: Multiplanar, multiecho pulse sequences of the brain and surrounding structures were obtained without intravenous contrast. COMPARISON:  Same-day CT head, MR head 03/30/2022 FINDINGS: Brain: There is no acute intracranial hemorrhage, extra-axial fluid collection, or acute infarct. Background parenchymal volume is stable. The ventricles are stable in size. There are prior infarcts in the right occipital lobe, high left frontoparietal cortex near the vertex, pons, and right cerebellar hemisphere/middle cerebellar peduncle. The high left frontoparietal cortical infarcts are late subacute to early chronic (acute on the MRI from 03/30/2022). There is additional patchy and confluence FLAIR signal abnormality in the supratentorial white matter likely reflecting sequela of underlying chronic small-vessel ischemic change, stable. Small foci of chronic blood products in the thalami are unchanged. There is no mass lesion.   There is no mass effect or midline shift. Vascular: Choose Skull and upper cervical spine: Normal marrow signal. Sinuses/Orbits: There is mild mucosal thickening in the paranasal sinuses. The globes and orbits are unremarkable. Other: None. IMPRESSION: 1. No acute intracranial pathology. 2. Late subacute to early chronic infarcts in the high left frontoparietal cortex. Additional remote infarcts and age advanced background chronic small-vessel ischemic change as above. Electronically Signed   By: 04/01/2022 M.D.   On: 05/01/2022 14:51   CT HEAD WO CONTRAST  Result Date: 05/01/2022 CLINICAL DATA:  Acute neurologic deficit with altered speech and difficulty walking. EXAM: CT HEAD WITHOUT CONTRAST TECHNIQUE: Contiguous axial images were obtained from the base of the skull through the vertex without intravenous contrast. RADIATION DOSE REDUCTION: This exam was performed according to the departmental dose-optimization program which includes automated exposure control, adjustment of the mA and/or kV according to patient size and/or use of iterative reconstruction technique. COMPARISON:  04/06/2022 FINDINGS: Brain: Remote right occipital infarct. More recent  high left parietal lobe infarcts as shown on MRI from 03/30/2022. The regions of poor gray-white differentiation in the high left parietal region appear roughly similar to those demonstrated on the prior MRI. No new infarct is currently identified. Periventricular white matter and corona radiata hypodensities favor chronic ischemic microvascular white matter disease. Otherwise, the brainstem, cerebellum, cerebral peduncles, thalamus, basal ganglia, basilar cisterns, and ventricular system appear within normal limits. No intracranial hemorrhage, mass lesion, or acute CVA. Vascular: Unremarkable Skull: Unremarkable Sinuses/Orbits: Chronic bilateral maxillary, bilateral ethmoid, and left sphenoid sinusitis. Old healed right medial orbital wall fracture. Other: No  supplemental non-categorized findings. IMPRESSION: 1. No acute intracranial findings. 2. Remote right occipital infarct. 3. More recent late subacute/early chronic high left parietal lobe infarcts as shown on MRI from 03/30/2022. 4. Chronic ischemic microvascular white matter disease. 5. Chronic paranasal sinusitis. 6. Old healed right medial orbital wall fracture. Electronically Signed   By: Gaylyn Rong M.D.   On: 05/01/2022 10:24        Scheduled Meds:  amLODipine  5 mg Oral Daily   aspirin EC  81 mg Oral Daily   clopidogrel  75 mg Oral Daily   enoxaparin (LOVENOX) injection  40 mg Subcutaneous Q24H   ferrous sulfate  325 mg Oral QODAY   gabapentin  400 mg Oral BID   insulin aspart  0-15 Units Subcutaneous TID WC   losartan  50 mg Oral Daily   metFORMIN  500 mg Oral BID WC   nicotine  14 mg Transdermal Daily   QUEtiapine  200 mg Oral QHS   rosuvastatin  40 mg Oral Daily   senna  1 tablet Oral BID   sodium chloride flush  3 mL Intravenous Once   sodium chloride flush  3 mL Intravenous Q12H   Continuous Infusions:  sodium chloride       LOS: 1 day        Huey Bienenstock, MD [facility]   To contact the attending provider between 7A-7P or the covering provider during after hours 7P-7A, please log into the web site [email or web address] and access using universal [place] password for that web site. If you do not have the password, please call the hospital operator.  05/02/2022, 11:14 AM

## 2022-05-02 NOTE — [place] FL2 (Signed)
[place] MEDICAID FL2 LEVEL OF CARE SCREENING TOOL     IDENTIFICATION  Patient Name: Kaitlyn Gray Birthdate: [birth year] Sex: female Admission Date (Current Location): 05/01/2022  [place] and [place] Number:  Haynes Bast [id number] K Facility and Address:  [facility], 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) 05/01/2022   Iron deficiency anemia 04/07/2022   Fall at home, initial encounter 04/07/2022   Ambulatory dysfunction 04/07/2022   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 04/07/2022   Stroke (cerebrum) (HCC) 04/06/2022   Class 1 obesity 04/06/2022   Tobacco abuse 04/06/2022   Abdominal pain 04/04/2022   History of stroke 03/31/2022   Hemiparesis affecting left side as late effect of cerebrovascular accident (CVA) (HCC) 03/31/2022   Opiate use    Microcytic anemia 03/30/2022   Chronic anemia    Noncompliance with medications    CVA (cerebral vascular accident) (HCC) 09/06/2020   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   History of cervical fracture 12/24/2017   Type 2 diabetes mellitus (HCC) 12/06/2017   Hypertension associated with diabetes (HCC) 12/06/2017   Bipolar disorder (HCC) 07/19/2017   Polysubstance abuse (HCC) 07/19/2017   Cocaine use disorder, severe, dependence (HCC) 03/24/2015   Depression 03/24/2015   Cannabis abuse 03/23/2013    Orientation RESPIRATION BLADDER Height & Weight     Self, Situation,  Place, Time  Normal External catheter Weight: 84.8 kg Height:  5\' 4"  (162.6 cm)  BEHAVIORAL SYMPTOMS/MOOD NEUROLOGICAL BOWEL NUTRITION STATUS      Continent Diet  AMBULATORY STATUS COMMUNICATION OF NEEDS Skin   Supervision Verbally Normal                       Personal Care Assistance Level of Assistance  Bathing, Feeding, Dressing, Total care Bathing Assistance: Limited assistance Feeding assistance: Independent Dressing Assistance: Limited assistance Total Care Assistance: Independent   Functional Limitations Info  Sight, Hearing, Speech Sight Info: Adequate Hearing Info: Adequate Speech Info: Impaired (Dysarthria)    SPECIAL CARE FACTORS FREQUENCY  PT (By licensed PT), OT (By licensed OT)     PT Frequency: 3-5 x per week OT Frequency: 3-5 x per week            Contractures Contractures Info: Not present    Additional Factors Info  Code Status, Allergies, Psychotropic, Insulin Sliding Scale Code Status Info: Full code Allergies Info: Hydrocodone, Latex Psychotropic Info: Seroquel Insulin Sliding Scale Info: Insulin - moderate sliding scale       Current Medications (05/02/2022):  This is the current hospital active medication list Current Facility-Administered Medications  Medication Dose Route Frequency Provider Last Rate Last Admin   0.9 %  sodium chloride infusion  250 mL Intravenous PRN Norins, 05/04/2022, MD       acetaminophen (TYLENOL) tablet 650 mg  650 mg Oral Q6H PRN Rosalyn Gess, MD   650 mg at 05/02/22 0816   Or   acetaminophen (TYLENOL) suppository  650 mg  650 mg Rectal Q6H PRN Norins, Rosalyn Gess, MD       amLODipine (NORVASC) tablet 5 mg  5 mg Oral Daily Norins, Rosalyn Gess, MD   5 mg at 05/02/22 0816   aspirin EC tablet 81 mg  81 mg Oral Daily Norins, Rosalyn Gess, MD   81 mg at 05/02/22 0816   clopidogrel (PLAVIX) tablet 75 mg  75 mg Oral Daily Norins, Rosalyn Gess, MD   75 mg at 05/02/22 0816   enoxaparin (LOVENOX) injection 40 mg  40 mg  Subcutaneous Q24H Norins, Rosalyn Gess, MD   40 mg at 05/01/22 1817   ferrous sulfate tablet 325 mg  325 mg Oral Shelia Media, MD   325 mg at 05/02/22 0817   gabapentin (NEURONTIN) capsule 400 mg  400 mg Oral BID Jacques Navy, MD   400 mg at 05/02/22 0817   insulin aspart (novoLOG) injection 0-15 Units  0-15 Units Subcutaneous TID WC Norins, Rosalyn Gess, MD   2 Units at 05/02/22 1117   losartan (COZAAR) tablet 50 mg  50 mg Oral Daily Jacques Navy, MD   50 mg at 05/02/22 0816   metFORMIN (GLUCOPHAGE-XR) 24 hr tablet 500 mg  500 mg Oral BID WC Norins, Rosalyn Gess, MD   500 mg at 05/02/22 3567   nicotine (NICODERM CQ - dosed in mg/24 hours) patch 14 mg  14 mg Transdermal Daily Norins, Rosalyn Gess, MD       QUEtiapine (SEROQUEL) tablet 200 mg  200 mg Oral QHS Norins, Rosalyn Gess, MD   200 mg at 05/01/22 2100   rosuvastatin (CRESTOR) tablet 40 mg  40 mg Oral Daily Jacques Navy, MD   40 mg at 05/02/22 0816   senna (SENOKOT) tablet 8.6 mg  1 tablet Oral BID Jacques Navy, MD   8.6 mg at 05/02/22 0817   sodium chloride flush (NS) 0.9 % injection 3 mL  3 mL Intravenous Once Fayrene Helper, PA-C       sodium chloride flush (NS) 0.9 % injection 3 mL  3 mL Intravenous Q12H Norins, Rosalyn Gess, MD   3 mL at 05/02/22 0818   sodium chloride flush (NS) 0.9 % injection 3 mL  3 mL Intravenous PRN Norins, Rosalyn Gess, MD       traZODone (DESYREL) tablet 25 mg  25 mg Oral QHS PRN Jacques Navy, MD   25 mg at 05/01/22 2047     Discharge Medications: Please see discharge summary for a list of discharge medications.  Relevant Imaging Results:  Relevant Lab Results:   Additional Information SS# [id number]  Janae Bridgeman, RN

## 2022-05-03 DIAGNOSIS — I69321 Dysphasia following cerebral infarction: Secondary | ICD-10-CM | POA: Diagnosis not present

## 2022-05-03 DIAGNOSIS — I63523 Cerebral infarction due to unspecified occlusion or stenosis of bilateral anterior cerebral arteries: Secondary | ICD-10-CM | POA: Diagnosis not present

## 2022-05-03 LAB — GLUCOSE, CAPILLARY
Glucose-Capillary: 161 mg/dL — ABNORMAL HIGH (ref 70–99)
Glucose-Capillary: 188 mg/dL — ABNORMAL HIGH (ref 70–99)
Glucose-Capillary: 104 mg/dL — ABNORMAL HIGH (ref 70–99)
Glucose-Capillary: 243 mg/dL — ABNORMAL HIGH (ref 70–99)

## 2022-05-03 MED ORDER — ALUM & MAG HYDROXIDE-SIMETH 200-200-20 MG/5ML PO SUSP
15.0000 mL | Freq: Four times a day (QID) | ORAL | Status: DC | PRN
  Administered 2022-05-03: 15 mL via ORAL
  Filled 2022-05-03: qty 30

## 2022-05-03 NOTE — Evaluation (Signed)
Occupational Therapy Evaluation Patient Details Name: Kaitlyn Gray MRN: [id number] DOB: 1975-10-23 Today's Date: 05/03/2022   History of Present Illness 46 yo female admitted 11/16 with slurred speech and falls, cocaine (+). MRI without acute infarcts. 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 Pt lives alone in an apartment, does not have any friends/family nearby to assist. Pt states she does not drive, walks to nearby convenient store and/or uses [employer] delivery for groceries. Pt currently needing min-mod A for ADLs, supervision for bed mobility, and min guard for transfers with Rollator. Pt needing mod cues for safety/task sequencing throughout session, generalized weakness and limited use of LUE with impaired coordination. Pt presenting with impairments listed below, will follow acutely. Recommend SNF at d/c.     Recommendations for follow up therapy are one component of a multi-disciplinary discharge planning process, led by the attending physician.  Recommendations may be updated based on patient status, additional functional criteria and insurance authorization.   Follow Up Recommendations  Skilled nursing-short term rehab (<3 hours/day)     Assistance Recommended at Discharge Frequent or constant Supervision/Assistance  Patient can return home with the following A lot of help with walking and/or transfers;A lot of help with bathing/dressing/bathroom;Assistance with cooking/housework;Help with stairs or ramp for entrance    Functional Status Assessment  Patient has had a recent decline in their functional status and demonstrates the ability to make significant improvements in function in a reasonable and predictable amount of time.  Equipment Recommendations  None recommended by OT (defer)    Recommendations  for Other Services PT consult     Precautions / Restrictions Precautions Precautions: Fall Precaution Comments: pt reports >10 falls in 6 mo Restrictions Weight Bearing Restrictions: No      Mobility Bed Mobility Overal bed mobility: Needs Assistance Bed Mobility: Supine to Sit, Sit to Supine     Supine to sit: Supervision Sit to supine: Supervision        Transfers Overall transfer level: Needs assistance Equipment used: Rollator (4 wheels) Transfers: Sit to/from Stand Sit to Stand: Min guard                  Balance Overall balance assessment: Needs assistance Sitting-balance support: Bilateral upper extremity supported, Feet supported Sitting balance-Leahy Scale: Fair Sitting balance - Comments: reaches down to pull up sock   Standing balance support: Bilateral upper extremity supported, Reliant on assistive device for balance Standing balance-Leahy Scale: Poor Standing balance comment: needs UE support to maintain standing balance                           ADL either performed or assessed with clinical judgement   ADL Overall ADL's : Needs assistance/impaired Eating/Feeding: Supervision/ safety Eating/Feeding Details (indicate cue type and reason): noted dropping spoon when self feeding with RUE Grooming: Min guard;Sitting;Cueing for sequencing Grooming Details (indicate cue type and reason): cues to turn off water/dry hands Upper Body Bathing: Minimal assistance   Lower Body Bathing: Moderate assistance   Upper Body Dressing : Minimal assistance   Lower Body Dressing: Moderate assistance   Toilet Transfer: Min guard;Ambulation;Regular Toilet;Rollator (4 wheels)   Toileting- Clothing Manipulation and Hygiene: Supervision/safety Toileting - Clothing Manipulation Details (indicate cue type and reason): pericare     Functional mobility during ADLs: Minimal  assistance;Rollator (4 wheels);Min guard       Vision Patient Visual Report:  No change from baseline Additional Comments: needs further assessment, able to read chart in room from bed distance accurately     Perception Perception Perception Tested?: No   Praxis Praxis Praxis tested?: Not tested    Pertinent Vitals/Pain Pain Assessment Pain Assessment: Faces Pain Score: 7  Faces Pain Scale: Hurts even more Pain Location: stomach Pain Descriptors / Indicators: Discomfort Pain Intervention(s): Limited activity within patient's tolerance, Monitored during session, Repositioned     Hand Dominance Right   Extremity/Trunk Assessment Upper Extremity Assessment Upper Extremity Assessment: Generalized weakness RUE Deficits / Details: 3+/5 shoulder ROM and grasp RUE Coordination: decreased fine motor;decreased gross motor LUE Deficits / Details: 2/5 shoulder ROM, 3/5 grasp LUE Coordination: decreased fine motor;decreased gross motor   Lower Extremity Assessment Lower Extremity Assessment: Defer to PT evaluation   Cervical / Trunk Assessment Cervical / Trunk Exceptions: pt with ataxic movement of trunk and bil LE with gait, grinding teeth   Communication Communication Communication: Expressive difficulties (slow, slurred speech)   Cognition Arousal/Alertness: Awake/alert Behavior During Therapy: Flat affect Overall Cognitive Status: Impaired/Different from baseline Area of Impairment: Orientation, Safety/judgement, Memory, Problem solving                 Orientation Level: Disoriented to, Time, Place   Memory: Decreased recall of precautions, Decreased short-term memory   Safety/Judgement: Decreased awareness of safety, Decreased awareness of deficits   Problem Solving: Difficulty sequencing, Requires verbal cues, Requires tactile cues, Slow processing, Decreased initiation General Comments: able to state date after reading it on the board, needing mod cues to identify upcoming holiday, thinking she is at [facility] hospital     General Comments  VSS  on RA    Exercises     Shoulder Instructions      Home Living Family/patient expects to be discharged to:: Private residence Living Arrangements: Alone   Type of Home: Apartment Home Access: Stairs to enter [employer] of Steps: 6   Home Layout: Two level;Bed/bath upstairs Alternate Level Stairs-Number of Steps: 14 Alternate Level Stairs-Rails: Right;Left Bathroom Shower/Tub: Chief Strategy Officer: Standard     Home Equipment: Rollator (4 wheels);Rolling Walker (2 wheels);BSC/3in1   Additional Comments: rollator with missing brakes      Prior Functioning/Environment Prior Level of Function : Needs assist             Mobility Comments: rollator for gait ADLs Comments: reports lack of transportation for shopping, unable to manage medication and reports difficulty with household management        OT Problem List: Decreased strength;Decreased activity tolerance;Impaired balance (sitting and/or standing);Decreased safety awareness;Decreased knowledge of use of DME or AE;Decreased coordination;Impaired vision/perception;Impaired sensation      OT Treatment/Interventions: Self-care/ADL training;Therapeutic exercise;DME and/or AE instruction;Energy conservation;Therapeutic activities;Patient/family education;Neuromuscular education;Balance training;Visual/perceptual remediation/compensation;Cognitive remediation/compensation    OT Goals(Current goals can be found in the care plan section) Acute Rehab OT Goals Patient Stated Goal: to shower OT Goal Formulation: With patient Time For Goal Achievement: 05/17/22 Potential to Achieve Goals: Good ADL Goals Pt Will Perform Grooming: Independently;standing Pt Will Perform Upper Body Dressing: with set-up;sitting;standing Pt Will Perform Lower Body Dressing: with supervision;sitting/lateral leans;sit to/from stand Pt Will Transfer to Toilet: with modified independence;ambulating;regular height  toilet Pt/caregiver will Perform Home Exercise Program: Left upper extremity;Increased ROM;Increased strength;With Supervision;With written HEP provided  OT Frequency: Min 2X/week    Co-evaluation  AM-PAC OT "6 Clicks" Daily Activity     Outcome Measure Help from another person eating meals?: A Little Help from another person taking care of personal grooming?: A Little Help from another person toileting, which includes using toliet, bedpan, or urinal?: A Little Help from another person bathing (including washing, rinsing, drying)?: A Lot Help from another person to put on and taking off regular upper body clothing?: A Lot Help from another person to put on and taking off regular lower body clothing?: A Lot 6 Click Score: 15   End of Session Equipment Utilized During Treatment: Gait belt;Rollator (4 wheels) Nurse Communication: Mobility status  Activity Tolerance: Patient tolerated treatment well Patient left: in bed;with call bell/phone within reach;with bed alarm set  OT Visit Diagnosis: Unsteadiness on feet (R26.81);Repeated falls (R29.6);Muscle weakness (generalized) (M62.81)                Time: [id number] OT Time Calculation (min): 19 min Charges:  OT General Charges $OT Visit: 1 Visit OT Evaluation $OT Eval Moderate Complexity: 1 Mod  Arlett Goold K, OTD, OTR/L SecureChat Preferred Acute Rehab [phone]   Laurice Iglesia K Koonce 05/03/2022, 1:15 PM

## 2022-05-03 NOTE — Progress Notes (Signed)
PROGRESS NOTE    Kaitlyn Gray  [id number] DOB: June 19, 1975 DOA: 05/01/2022 PCP: Patient, No Pcp Per    Chief Complaint  Patient presents with   Altered Mental Status    Brief Narrative:   46 year old female with significant past medical history which includes recent CVA with MRI of the brain shows cluster of acute infarction of the left frontal parietal vertex, punctate acute infarct of the right medial parietal lobes and old midbrain, cerebellar and cerebral infarct, along with CTA of the head and neck show significant intracranial and extracranial stenosis. On 10/15 discharged. Returned  on 10/20and was awaiting SNF placement - left AMA. Additional history of diabetes, hypertension, polysubstance use including marijuana cocaine tobacco use, noncompliance. She is  brought to [facility] via EMS from home for complaints of increased speech difficulty.  History difficult to obtain due to patient's aphasia.  Patient mention for the past several days she has had difficulty ambulating despite using her walker.  She has fallen multiple times with her legs giving out.  She also noticed more difficulty with her speech since yesterday.  Patient does not think she has any significant injury from her falls her MRI was significant for acute CVA.   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 -Is noncompliant with any of her medications. -Still with polysubstance abuse, positive for cocaine which likely contributed to this acute CVA. -She had recent work-up including CTA which was significant for sclerosis with a right mid ICA 70% stenosis. -No need to  repeat work-up, will start on aspirin and Plavix, to continue for 3 months then Plavix alone. -Started on statin. -PT/OT/SLP reconsulted,  will need SNF placement  Hypertension associated with diabetes (HCC) - Continue home meds   Type 2 diabetes mellitus (HCC) - Last A1C 03/30/22 6.6% - Continue metformin, hold glipizide, continue with insulin sliding scale            Polysubstance abuse (HCC) - Hx of polysubstance abuse including THC, cocaine, tobacco. - Prn Xanax for any withdrawal for stimulants  - Nicotine patch   Bipolar disorder (HCC) - Long standing problem. - Continue Seroquel       DVT prophylaxis: Lovenox Code Status: Full Family Communication: none at bedside Disposition: likely will need SNF, awaiting PT/OT  Status is: Inpatient    Consultants:  None   Subjective:  He had some vomiting overnight, resolved this morning, she still reports unsteady gait. Objective: Vitals:   05/02/22 1949 05/02/22 1950 05/03/22 0439 05/03/22 0829  BP: (!) 158/67 (!) 158/67 (!) 143/66 (!) 158/82  Pulse: 76 76 70 66  Resp: 17 17 17 18   Temp: 98.7 F (37.1 C) 98.7 F (37.1 C) 98.2 F (36.8 C) 98 F (36.7 C)  TempSrc: Oral Oral  Oral  SpO2: 97% 98% 100% 99%  Weight:      Height:        Intake/Output Summary (Last 24 hours) at 05/03/2022 0957 Last data filed at 05/02/2022 1403 Gross per 24 hour  Intake 237 ml  Output --  Net 237 ml   Filed Weights   05/01/22 2052  Weight: 84.8 kg    Examination:  Awake Alert, Oriented X 3, Symmetrical Chest wall movement, Good air movement bilaterally, CTAB RRR,No Gallops,Rubs or new Murmurs, No Parasternal Heave +ve B.Sounds, Abd Soft, No tenderness, No rebound - guarding or rigidity. No Cyanosis, Clubbing or edema, No new Rash or bruise       Data Reviewed: I have personally reviewed following labs and imaging studies  CBC: Recent Labs  Lab 04/29/22 2056 05/01/22 1028 05/01/22 1038  WBC 5.0 4.3  --    NEUTROABS 2.7 2.4  --   HGB 10.0* 9.6* 11.6*  HCT 35.1* 32.2* 34.0*  MCV 79.2* 77.6*  --   PLT 315 249  --     Basic Metabolic Panel: Recent Labs  Lab 04/29/22 2056 05/01/22 1028 05/01/22 1038  NA 139 142 142  K 3.4* 3.4* 3.4*  CL 104 108 105  CO2 24 27  --   GLUCOSE 100* 111* 109*  BUN 12 18 19   CREATININE 0.81 1.00 0.90  CALCIUM 9.1 9.0  --     GFR: Estimated Creatinine Clearance: 81.4 mL/min (by C-G formula based on SCr of 0.9 mg/dL).  Liver Function Tests: Recent Labs  Lab 04/29/22 2056 05/01/22 1028  AST 14* 15  ALT 11 12  ALKPHOS 55 56  BILITOT 0.3 0.5  PROT 7.3 6.8  ALBUMIN 3.8 3.5    CBG: Recent Labs  Lab 05/02/22 0739 05/02/22 1233 05/02/22 1636 05/02/22 1942 05/03/22 0833  GLUCAP 132* 120* 155* 196* 161*     No results found for this or any previous visit (from the past 240 hour(s)).       Radiology Studies: MR BRAIN WO CONTRAST  Result Date: 05/01/2022 CLINICAL DATA:  Neuro deficit, stroke suspected. EXAM: MRI HEAD WITHOUT CONTRAST TECHNIQUE: Multiplanar, multiecho pulse sequences of the brain and surrounding structures were obtained without intravenous contrast. COMPARISON:  Same-day CT head, MR head 03/30/2022 FINDINGS: Brain: There is no acute intracranial hemorrhage, extra-axial fluid collection, or acute infarct. Background parenchymal volume is stable. The ventricles are stable in size. There are prior infarcts in the right occipital lobe, high left frontoparietal cortex near the vertex, pons, and right cerebellar hemisphere/middle cerebellar peduncle. The high left frontoparietal cortical infarcts are late subacute to early chronic (acute on the MRI from 03/30/2022). There is additional patchy and confluence FLAIR signal abnormality in the supratentorial white matter likely reflecting sequela of underlying chronic small-vessel ischemic change, stable. Small foci of chronic blood products in the thalami are unchanged. There is no mass  lesion.  There is no mass effect or midline shift. Vascular: Choose Skull and upper cervical spine: Normal marrow signal. Sinuses/Orbits: There is mild mucosal thickening in the paranasal sinuses. The globes and orbits are unremarkable. Other: None. IMPRESSION: 1. No acute intracranial pathology. 2. Late subacute to early chronic infarcts in the high left frontoparietal cortex. Additional remote infarcts and age advanced background chronic small-vessel ischemic change as above. Electronically Signed   By: 04/01/2022 M.D.   On: 05/01/2022 14:51   CT HEAD WO CONTRAST  Result Date: 05/01/2022 CLINICAL DATA:  Acute neurologic deficit with altered speech and difficulty walking. EXAM: CT HEAD WITHOUT CONTRAST TECHNIQUE: Contiguous axial images were obtained from the base of the skull through the vertex without intravenous contrast. RADIATION DOSE REDUCTION: This exam was performed according to the departmental dose-optimization program which includes automated exposure control, adjustment of the mA and/or kV according to patient size and/or use of iterative reconstruction technique. COMPARISON:  04/06/2022 FINDINGS: Brain: Remote right occipital infarct. More  recent high left parietal lobe infarcts as shown on MRI from 03/30/2022. The regions of poor gray-white differentiation in the high left parietal region appear roughly similar to those demonstrated on the prior MRI. No new infarct is currently identified. Periventricular white matter and corona radiata hypodensities favor chronic ischemic microvascular white matter disease. Otherwise, the brainstem, cerebellum, cerebral peduncles, thalamus, basal ganglia, basilar cisterns, and ventricular system appear within normal limits. No intracranial hemorrhage, mass lesion, or acute CVA. Vascular: Unremarkable Skull: Unremarkable Sinuses/Orbits: Chronic bilateral maxillary, bilateral ethmoid, and left sphenoid sinusitis. Old healed right medial orbital wall fracture.  Other: No supplemental non-categorized findings. IMPRESSION: 1. No acute intracranial findings. 2. Remote right occipital infarct. 3. More recent late subacute/early chronic high left parietal lobe infarcts as shown on MRI from 03/30/2022. 4. Chronic ischemic microvascular white matter disease. 5. Chronic paranasal sinusitis. 6. Old healed right medial orbital wall fracture. Electronically Signed   By: Gaylyn Rong M.D.   On: 05/01/2022 10:24        Scheduled Meds:  amLODipine  5 mg Oral Daily   aspirin EC  81 mg Oral Daily   clopidogrel  75 mg Oral Daily   enoxaparin (LOVENOX) injection  40 mg Subcutaneous Q24H   ferrous sulfate  325 mg Oral QODAY   gabapentin  400 mg Oral BID   insulin aspart  0-15 Units Subcutaneous TID WC   losartan  50 mg Oral Daily   metFORMIN  500 mg Oral BID WC   nicotine  14 mg Transdermal Daily   QUEtiapine  200 mg Oral QHS   rosuvastatin  40 mg Oral Daily   senna  1 tablet Oral BID   sodium chloride flush  3 mL Intravenous Once   sodium chloride flush  3 mL Intravenous Q12H   Continuous Infusions:  sodium chloride       LOS: 2 days        Huey Bienenstock, MD [facility]   To contact the attending provider between 7A-7P or the covering provider during after hours 7P-7A, please log into the web site [email or web address] and access using universal [place] password for that web site. If you do not have the password, please call the hospital operator.  05/03/2022, 9:57 AM

## 2022-05-04 DIAGNOSIS — I69321 Dysphasia following cerebral infarction: Secondary | ICD-10-CM | POA: Diagnosis not present

## 2022-05-04 LAB — GLUCOSE, CAPILLARY
Glucose-Capillary: 146 mg/dL — ABNORMAL HIGH (ref 70–99)
Glucose-Capillary: 157 mg/dL — ABNORMAL HIGH (ref 70–99)
Glucose-Capillary: 109 mg/dL — ABNORMAL HIGH (ref 70–99)
Glucose-Capillary: 165 mg/dL — ABNORMAL HIGH (ref 70–99)

## 2022-05-04 MED ORDER — METFORMIN HCL 500 MG PO TABS
1000.0000 mg | ORAL_TABLET | Freq: Two times a day (BID) | ORAL | Status: DC
  Administered 2022-05-04: 1000 mg via ORAL
  Filled 2022-05-04: qty 2

## 2022-05-04 MED ORDER — PANTOPRAZOLE SODIUM 40 MG PO TBEC
40.0000 mg | DELAYED_RELEASE_TABLET | Freq: Every day | ORAL | Status: DC
  Administered 2022-05-04 – 2022-05-06 (×3): 40 mg via ORAL
  Filled 2022-05-04 (×3): qty 1

## 2022-05-04 MED ORDER — POLYETHYLENE GLYCOL 3350 17 G PO PACK
17.0000 g | PACK | Freq: Once | ORAL | Status: AC
  Administered 2022-05-04: 17 g via ORAL
  Filled 2022-05-04: qty 1

## 2022-05-04 MED ORDER — METFORMIN HCL ER 500 MG PO TB24
500.0000 mg | ORAL_TABLET | Freq: Two times a day (BID) | ORAL | Status: DC
  Administered 2022-05-04 – 2022-05-05 (×3): 500 mg via ORAL
  Filled 2022-05-04 (×5): qty 1

## 2022-05-04 MED ORDER — GLIPIZIDE 5 MG PO TABS
2.5000 mg | ORAL_TABLET | Freq: Every day | ORAL | Status: DC
  Administered 2022-05-04 – 2022-05-06 (×3): 2.5 mg via ORAL
  Filled 2022-05-04 (×3): qty 1

## 2022-05-04 NOTE — Progress Notes (Signed)
Mobility Specialist Progress Note:   05/04/22 0930  Mobility  Activity Dangled on edge of bed  Assistive Device None  Activity Response Tolerated well  Mobility Referral Yes  $Mobility charge 1 Mobility   Pt received in bed and agreeable with encouragement. No complaints of pain or SOB. Pt declined further mobility for today d/t fatigue and wanting to get back to sleep. Pt left in bed with all needs met and call bell in reach.   Andrey Campanile Mobility Specialist Please contact via SecureChat or  Rehab office at [phone]

## 2022-05-04 NOTE — Progress Notes (Signed)
PROGRESS NOTE    Kaitlyn Gray  NTI:[id number] DOB: 09/09/1975 DOA: 05/01/2022 PCP: Patient, No Pcp Per    Chief Complaint  Patient presents with   Altered Mental Status    Brief Narrative:   46 year old female with significant past medical history which includes recent CVA with MRI of the brain shows cluster of acute infarction of the left frontal parietal vertex, punctate acute infarct of the right medial parietal lobes and old midbrain, cerebellar and cerebral infarct, along with CTA of the head and neck show significant intracranial and extracranial stenosis. On 10/15 discharged. Returned  on 10/20and was awaiting SNF placement - left AMA. Additional history of diabetes, hypertension, polysubstance use including marijuana cocaine tobacco use, noncompliance. She is  brought to [facility] via EMS from home for complaints of increased speech difficulty.  History difficult to obtain due to patient's aphasia.  Patient mention for the past several days she has had difficulty ambulating despite using her walker.  She has fallen multiple times with her legs giving out.  She also noticed more difficulty with her speech since yesterday.  Patient does not think she has any significant injury from her falls her MRI was significant for acute CVA.   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 -Is noncompliant with any of her medications. -Still with polysubstance abuse, positive for cocaine which likely contributed to this acute CVA. -She had recent work-up including CTA which was significant for sclerosis with a right mid ICA 70% stenosis. -No need to  repeat work-up, will start on aspirin and Plavix, to continue for 3 months then Plavix alone. -Started on statin. -PT/OT/SLP reconsulted,  will need SNF placement  Hypertension associated with diabetes (HCC) - Continue home meds   Type 2 diabetes mellitus (HCC) - Last A1C 03/30/22 6.6% - Continue metformin, CBG are elevated, will start on low-dose glipizide.              Polysubstance abuse (HCC) - Hx of polysubstance abuse including THC, cocaine, tobacco. - Prn Xanax for any withdrawal for stimulants  - Nicotine patch   Bipolar disorder (HCC) - Long standing problem. - Continue Seroquel   Reports some indigestion, so we will start on PPI, and as needed Maalox   DVT prophylaxis: Lovenox Code Status: Full Family Communication: none at bedside Disposition: likely will need SNF, awaiting PT/OT  Status is: Inpatient    Consultants:  None   Subjective:  No further vomiting, she does report some stomach indigestion which has improved with Maalox. Objective: Vitals:   05/03/22 0829 05/03/22 1626 05/03/22 1924 05/04/22 0816  BP: (!) 158/82 135/76 (!) 153/77 (!) 155/80  Pulse: 66 74 69 77  Resp: 18 18 17 18   Temp: 98 F (36.7 C) 98.1 F (36.7 C) 98.1 F (36.7 C) 98.4 F (36.9 C)  TempSrc: Oral Oral Oral Oral  SpO2: 99% 99% 100% 100%  Weight:      Height:        Intake/Output Summary (Last 24 hours) at 05/04/2022 0932 Last data filed at 05/03/2022 2100 Gross per 24 hour  Intake 240 ml  Output --  Net 240 ml  Filed Weights   05/01/22 2052  Weight: 84.8 kg    Examination:  Awake Alert, Oriented X 3, she is appropriate, comprehendible, but with slow tone Symmetrical Chest wall movement, Good air movement bilaterally, CTAB RRR,No Gallops,Rubs or new Murmurs, No Parasternal Heave +ve B.Sounds, Abd Soft, No tenderness, No rebound - guarding or rigidity. No Cyanosis, Clubbing or edema, No new Rash or bruise       Data Reviewed: I have personally reviewed  following labs and imaging studies  CBC: Recent Labs  Lab 04/29/22 2056 05/01/22 1028 05/01/22 1038  WBC 5.0 4.3  --   NEUTROABS 2.7 2.4  --   HGB 10.0* 9.6* 11.6*  HCT 35.1* 32.2* 34.0*  MCV 79.2* 77.6*  --   PLT 315 249  --     Basic Metabolic Panel: Recent Labs  Lab 04/29/22 2056 05/01/22 1028 05/01/22 1038  NA 139 142 142  K 3.4* 3.4* 3.4*  CL 104 108 105  CO2 24 27  --   GLUCOSE 100* 111* 109*  BUN 12 18 19   CREATININE 0.81 1.00 0.90  CALCIUM 9.1 9.0  --     GFR: Estimated Creatinine Clearance: 81.4 mL/min (by C-G formula based on SCr of 0.9 mg/dL).  Liver Function Tests: Recent Labs  Lab 04/29/22 2056 05/01/22 1028  AST 14* 15  ALT 11 12  ALKPHOS 55 56  BILITOT 0.3 0.5  PROT 7.3 6.8  ALBUMIN 3.8 3.5    CBG: Recent Labs  Lab 05/03/22 0833 05/03/22 1155 05/03/22 1620 05/03/22 2129 05/04/22 0809  GLUCAP 161* 188* 104* 243* 146*     No results found for this or any previous visit (from the past 240 hour(s)).       Radiology Studies: No results found.      Scheduled Meds:  amLODipine  5 mg Oral Daily   aspirin EC  81 mg Oral Daily   clopidogrel  75 mg Oral Daily   enoxaparin (LOVENOX) injection  40 mg Subcutaneous Q24H   ferrous sulfate  325 mg Oral QODAY   gabapentin  400 mg Oral BID   insulin aspart  0-15 Units Subcutaneous TID WC   losartan  50 mg Oral Daily   metFORMIN  1,000 mg Oral BID WC   nicotine  14 mg Transdermal Daily   pantoprazole  40 mg Oral Daily   QUEtiapine  200 mg Oral QHS   rosuvastatin  40 mg Oral Daily   senna  1 tablet Oral BID   sodium chloride flush  3 mL Intravenous Once   sodium chloride flush  3 mL Intravenous Q12H   Continuous Infusions:  sodium chloride       LOS: 3 days        05/06/22, MD [facility]   To contact the attending provider between 7A-7P or the covering provider during after hours 7P-7A, please log into the web site [email or web address] and access using  universal [place] password for that web site. If you do not have the password, please call the hospital operator.  05/04/2022, 9:32 AM

## 2022-05-05 DIAGNOSIS — I63523 Cerebral infarction due to unspecified occlusion or stenosis of bilateral anterior cerebral arteries: Secondary | ICD-10-CM | POA: Diagnosis not present

## 2022-05-05 DIAGNOSIS — I69321 Dysphasia following cerebral infarction: Secondary | ICD-10-CM | POA: Diagnosis not present

## 2022-05-05 LAB — GLUCOSE, CAPILLARY
Glucose-Capillary: 108 mg/dL — ABNORMAL HIGH (ref 70–99)
Glucose-Capillary: 189 mg/dL — ABNORMAL HIGH (ref 70–99)
Glucose-Capillary: 198 mg/dL — ABNORMAL HIGH (ref 70–99)
Glucose-Capillary: 162 mg/dL — ABNORMAL HIGH (ref 70–99)

## 2022-05-05 MED ORDER — AMLODIPINE BESYLATE 10 MG PO TABS
10.0000 mg | ORAL_TABLET | Freq: Every day | ORAL | Status: DC
  Administered 2022-05-05 – 2022-05-06 (×2): 10 mg via ORAL
  Filled 2022-05-05 (×2): qty 1

## 2022-05-05 NOTE — TOC Progression Note (Addendum)
Transition of Care [facility] - Progression Note    Patient Details  Name: Kaitlyn Gray MRN: [id number] Date of Birth: Dec 18, 1975  [facility]) CM/SW Contact  Janae Bridgeman, RN Phone Number: 05/05/2022, 10:15 AM  Clinical Narrative:    CM noted patient with no bed offers at this time for SNF placement.  I called and spoke with Whitney, Admission CM with [facility] and she will review the patient for needed SNF placement.  CM sent a message to Jiles Crocker, [facility] Supervisor to inquire about availability to place patient on ST[facility] Team.  CM will continue to follow the patient for SNF placement.  05/05/2022 1551 - CM states that she spoke with her boyfriend, Markham Jordan [phone] and he is willing to speak with patient's family/ friends to provide supervision at the home.   At this time - the patient has not found available family/friends to assist.  The patient's boyfriend works 6 am to 2 pm and does not drive.  No SNF offers are present at this time.   Expected Discharge Plan: Skilled Nursing Facility Barriers to Discharge: Continued Medical Work up, Active Substance Use - Placement (awaiting PASRR approval)  Expected Discharge Plan and Services Expected Discharge Plan: Skilled Nursing Facility   Discharge Planning Services: CM Consult Post Acute Care Choice: Skilled Nursing Facility Living arrangements for the past 2 months: Single Family Home                                       Social Determinants of Health (SDOH) Interventions Transportation Interventions: Inpatient TOC, Other (Comment) (Provided with community resources in discharge instructions for Medicaid transportation) Utilities Interventions: Inpatient TOC (provided with communtiy resources in instructions)  Readmission Risk Interventions    05/02/2022    1:55 PM 04/09/2022   11:37 AM  Readmission Risk Prevention Plan  Transportation Screening Complete Complete  Medication  Review Oceanographer) Complete Complete  PCP or Specialist appointment within 3-5 days of discharge Complete Complete  HRI or Home Care Consult Complete Complete  SW Recovery Care/Counseling Consult Complete   Palliative Care Screening Not Applicable Not Applicable  Skilled Nursing Facility Complete Complete

## 2022-05-05 NOTE — Progress Notes (Addendum)
PROGRESS NOTE    Kaitlyn Gray  MEB:[id number] DOB: 07/05/1975 DOA: 05/01/2022 PCP: Patient, No Pcp Per    Chief Complaint  Patient presents with   Altered Mental Status    Brief Narrative:   46 year old female with significant past medical history which includes recent CVA with MRI of the brain shows cluster of acute infarction of the left frontal parietal vertex, punctate acute infarct of the right medial parietal lobes and old midbrain, cerebellar and cerebral infarct, along with CTA of the head and neck show significant intracranial and extracranial stenosis. On 10/15 discharged. Returned  on 10/20and was awaiting SNF placement - left AMA. Additional history of diabetes, hypertension, polysubstance use including marijuana cocaine tobacco use, noncompliance. She is  brought to [facility] via EMS from home for complaints of increased speech difficulty.  History difficult to obtain due to patient's aphasia.  Patient mention for the past several days she has had difficulty ambulating despite using her walker.  She has fallen multiple times with her legs giving out.  She also noticed more difficulty with her speech since yesterday.  Patient does not think she has any significant injury from her falls her MRI was significant for acute CVA.   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 -Is noncompliant with any of her medications. -Still with polysubstance abuse, positive for cocaine which likely contributed to this acute CVA. -She had recent work-up including CTA which was significant for sclerosis with a right mid ICA 70% stenosis. -No need to  repeat work-up, will start on aspirin and Plavix, to continue for 3 months then Plavix alone. -Started on statin. -PT/OT/SLP reconsulted,  will need SNF placement  Hypertension associated with diabetes (HCC) - Continue home meds, remains elevated, will increase Norvasc to 10 mg oral daily.   Type 2 diabetes mellitus (HCC) - Last A1C 03/30/22 6.6% - Continue metformin, CBG are elevated, will start on low-dose glipizide.              Polysubstance abuse (HCC) - Hx of polysubstance abuse including THC, cocaine, tobacco. - Prn Xanax for any withdrawal for stimulants  - Nicotine patch   Bipolar disorder (HCC) - Long standing problem. - Continue Seroquel   Reports some indigestion, so we will start on PPI, and as needed Maalox   DVT prophylaxis: Lovenox Code Status: Full Family Communication: none at bedside Disposition: Difficult disposition, awaiting for subacute rehab, but no bed offers, she will need extensive home health including PT/OT/RN decision is made to go home, so we will [facility] to [facility] program.  Status is: Inpatient    Consultants:  None   Subjective:  No nausea, no vomiting, indigestion has resolved, no significant events.    Objective: Vitals:   05/04/22 0816 05/04/22 1643 05/04/22 1944 05/05/22 0700  BP: (!) 155/80 (!) 165/75 (!) 171/73 (!) 155/74  Pulse: 77 78 85 75  Resp: 18 18 17 17   Temp: 98.4 F (36.9 C) 97.7 F (36.5 C) 97.8 F (36.6 C) 98.1 F (36.7 C)  TempSrc: Oral Oral  Oral  SpO2: 100% 99% 100% 100%  Weight:      Height:  Intake/Output Summary (Last 24 hours) at 05/05/2022 0950 Last data filed at 05/05/2022 0854 Gross per 24 hour  Intake 717 ml  Output --  Net 717 ml   Filed Weights   05/01/22 2052  Weight: 84.8 kg    Examination:  Awake Alert, Oriented X 3, she is appropriate, comprehendible, but with slow tone Symmetrical Chest wall movement, Good air movement bilaterally, CTAB RRR,No Gallops,Rubs or new  Murmurs, No Parasternal Heave +ve B.Sounds, Abd Soft, No tenderness, No rebound - guarding or rigidity. No Cyanosis, Clubbing or edema, No new Rash or bruise        Data Reviewed: I have personally reviewed following labs and imaging studies  CBC: Recent Labs  Lab 04/29/22 2056 05/01/22 1028 05/01/22 1038  WBC 5.0 4.3  --   NEUTROABS 2.7 2.4  --   HGB 10.0* 9.6* 11.6*  HCT 35.1* 32.2* 34.0*  MCV 79.2* 77.6*  --   PLT 315 249  --     Basic Metabolic Panel: Recent Labs  Lab 04/29/22 2056 05/01/22 1028 05/01/22 1038  NA 139 142 142  K 3.4* 3.4* 3.4*  CL 104 108 105  CO2 24 27  --   GLUCOSE 100* 111* 109*  BUN 12 18 19   CREATININE 0.81 1.00 0.90  CALCIUM 9.1 9.0  --     GFR: Estimated Creatinine Clearance: 81.4 mL/min (by C-G formula based on SCr of 0.9 mg/dL).  Liver Function Tests: Recent Labs  Lab 04/29/22 2056 05/01/22 1028  AST 14* 15  ALT 11 12  ALKPHOS 55 56  BILITOT 0.3 0.5  PROT 7.3 6.8  ALBUMIN 3.8 3.5    CBG: Recent Labs  Lab 05/04/22 0809 05/04/22 1205 05/04/22 1637 05/04/22 2019 05/05/22 0712  GLUCAP 146* 157* 109* 165* 108*     No results found for this or any previous visit (from the past 240 hour(s)).       Radiology Studies: No results found.      Scheduled Meds:  amLODipine  10 mg Oral Daily   aspirin EC  81 mg Oral Daily   clopidogrel  75 mg Oral Daily   enoxaparin (LOVENOX) injection  40 mg Subcutaneous Q24H   ferrous sulfate  325 mg Oral QODAY   gabapentin  400 mg Oral BID   glipiZIDE  2.5 mg Oral QAC breakfast   insulin aspart  0-15 Units Subcutaneous TID WC   losartan  50 mg Oral Daily   metFORMIN  500 mg Oral BID WC   nicotine  14 mg Transdermal Daily   pantoprazole  40 mg Oral Daily   QUEtiapine  200 mg Oral QHS   rosuvastatin  40 mg Oral Daily   senna  1 tablet Oral BID   sodium chloride flush  3 mL Intravenous Once   sodium chloride flush  3 mL Intravenous Q12H   Continuous Infusions:  sodium  chloride       LOS: 4 days        05/07/22, MD [facility]   To contact the attending provider between 7A-7P or the covering provider during after hours 7P-7A, please log into the web site [email or web address] and access using universal [place] password for that web site. If you do not have the password, please call the hospital operator.  05/05/2022, 9:50 AM

## 2022-05-05 NOTE — Progress Notes (Signed)
Pt refused all vitals, telling me to get out of her room.  RN notified

## 2022-05-05 NOTE — Progress Notes (Signed)
Mobility Specialist Progress Note:   05/05/22 1115  Mobility  Activity Refused mobility   Pt refused mobility d/t recently waking up and not having enough time to wake up before session. Pt educated on the importance of mobility. Will f/u as able.    Daine Gravel Mobility Specialist Please contact via SecureChat or  Rehab office at [phone]

## 2022-05-06 ENCOUNTER — Other Ambulatory Visit (HOSPITAL_COMMUNITY): Payer: Self-pay

## 2022-05-06 ENCOUNTER — Encounter (HOSPITAL_COMMUNITY): Payer: Self-pay | Admitting: Internal Medicine

## 2022-05-06 DIAGNOSIS — I63523 Cerebral infarction due to unspecified occlusion or stenosis of bilateral anterior cerebral arteries: Secondary | ICD-10-CM | POA: Diagnosis not present

## 2022-05-06 DIAGNOSIS — I69321 Dysphasia following cerebral infarction: Secondary | ICD-10-CM | POA: Diagnosis not present

## 2022-05-06 LAB — GLUCOSE, CAPILLARY: Glucose-Capillary: 117 mg/dL — ABNORMAL HIGH (ref 70–99)

## 2022-05-06 MED ORDER — METFORMIN HCL ER 500 MG PO TB24
500.0000 mg | ORAL_TABLET | Freq: Two times a day (BID) | ORAL | 1 refills | Status: DC
  Filled 2022-05-06: qty 60, 30d supply, fill #0

## 2022-05-06 MED ORDER — FERROUS SULFATE 325 (65 FE) MG PO TABS
325.0000 mg | ORAL_TABLET | ORAL | 1 refills | Status: DC
  Filled 2022-05-06: qty 30, 60d supply, fill #0

## 2022-05-06 MED ORDER — CLOPIDOGREL BISULFATE 75 MG PO TABS
75.0000 mg | ORAL_TABLET | Freq: Every day | ORAL | 1 refills | Status: DC
  Filled 2022-05-06: qty 30, 30d supply, fill #0

## 2022-05-06 MED ORDER — LOSARTAN POTASSIUM 100 MG PO TABS
100.0000 mg | ORAL_TABLET | Freq: Every day | ORAL | 1 refills | Status: DC
  Filled 2022-05-06: qty 30, 30d supply, fill #0

## 2022-05-06 MED ORDER — AMLODIPINE BESYLATE 10 MG PO TABS
10.0000 mg | ORAL_TABLET | Freq: Every day | ORAL | 1 refills | Status: DC
  Filled 2022-05-06: qty 30, 30d supply, fill #0

## 2022-05-06 MED ORDER — GLIPIZIDE 5 MG PO TABS
2.5000 mg | ORAL_TABLET | Freq: Every day | ORAL | 1 refills | Status: DC
  Filled 2022-05-06: qty 30, 60d supply, fill #0

## 2022-05-06 MED ORDER — ROSUVASTATIN CALCIUM 40 MG PO TABS
40.0000 mg | ORAL_TABLET | Freq: Every day | ORAL | 1 refills | Status: DC
  Filled 2022-05-06: qty 30, 30d supply, fill #0

## 2022-05-06 MED ORDER — ASPIRIN 81 MG PO TBEC
81.0000 mg | DELAYED_RELEASE_TABLET | Freq: Every day | ORAL | 1 refills | Status: DC
  Filled 2022-05-06: qty 30, 30d supply, fill #0

## 2022-05-06 MED ORDER — LOSARTAN POTASSIUM 50 MG PO TABS
100.0000 mg | ORAL_TABLET | Freq: Every day | ORAL | Status: DC
  Administered 2022-05-06: 100 mg via ORAL
  Filled 2022-05-06: qty 2

## 2022-05-06 MED ORDER — PANTOPRAZOLE SODIUM 40 MG PO TBEC
40.0000 mg | DELAYED_RELEASE_TABLET | Freq: Every day | ORAL | 0 refills | Status: DC
  Filled 2022-05-06: qty 30, 30d supply, fill #0

## 2022-05-06 NOTE — TOC Progression Note (Addendum)
Transition of Care [facility]) - Progression Note    Patient Details  Name: Kaitlyn Gray MRN: [id number] Date of Birth: 1975-09-14  Transition of Care [facility]) CM/SW Contact  Janae Bridgeman, RN Phone Number: 05/06/2022, 9:41 AM  Clinical Narrative:    [facility] was contacted and follow up PCP was coordinated for May 27, 2022 at 0930 am - noted in discharge instructions.  CM will follow up with admission coordinator at [facility] for possible bed offer at one of the facilities.  I called Whitney, CM with Admissions and the facility is reviewing her at [facility] at this time - no bed offer at this time but they are reviewing.  CM will continue to follow the patient for SNF placement or home with family if able - no family availability at this time.  05/06/2022 1354 - Patient asked to speak with CM.  The patient states that "I just want to go home".  She states that she has her own apartment and does not want to go to a nursing home for rehabilitation.  [facility] is reviewing the patient at this time but has not offered a bed at this time.  Patient refuses to go "to any nursing home" and just wants to go home.  Attending physician was updated.  Bedside nursing is aware.  I explained to the patient that PT has recommended SNF placement and that it is unsafe for her to leave AMA.  The patient states that she just wants to leave and that she is able to "take care of herself now".  Attending physician is aware that patient is demanding to go home where she lives alone.  Patient has 3 working cell phones and states that she will call her friend to come and check on her.  Rolator is located in the patient's room.  Patient gave verbal permission for taxi voucher.  Signed and placed in the chart.  I called the patient's friend, Markham Jordan [phone] and he plans to meet the patient at her apartment this afternoon and assist the patient.  The patient is  discharging with her Rolator present in the hospital room.   Expected Discharge Plan: Skilled Nursing Facility Barriers to Discharge: Continued Medical Work up, Active Substance Use - Placement (awaiting PASRR approval)  Expected Discharge Plan and Services Expected Discharge Plan: Skilled Nursing Facility   Discharge Planning Services: CM Consult Post Acute Care Choice: Skilled Nursing Facility Living arrangements for the past 2 months: Single Family Home                                       Social Determinants of Health (SDOH) Interventions Food Insecurity Interventions: Inpatient [facility] (Community Resources provided in discharge instructions) Transportation Interventions: Inpatient [facility] (Community Resources Provided in discharge instructions) Utilities Interventions: Inpatient TOC (provided with communtiy resources in instructions)  Readmission Risk Interventions    05/02/2022    1:55 PM 04/09/2022   11:37 AM  Readmission Risk Prevention Plan  Transportation Screening Complete Complete  Medication Review Oceanographer) Complete Complete  PCP or Specialist appointment within 3-5 days of discharge Complete Complete  HRI or Home Care Consult Complete Complete  SW Recovery Care/Counseling Consult Complete   Palliative Care Screening Not Applicable Not Applicable  Skilled Nursing Facility Complete Complete

## 2022-05-06 NOTE — Plan of Care (Signed)
  Problem: Education: Goal: Knowledge of General Education information will improve Description: Including pain rating scale, medication(s)/side effects and non-pharmacologic comfort measures Outcome: Adequate for Discharge   Problem: Health Behavior/Discharge Planning: Goal: Ability to manage health-related needs will improve Outcome: Adequate for Discharge   Problem: Clinical Measurements: Goal: Ability to maintain clinical measurements within normal limits will improve Outcome: Adequate for Discharge Goal: Will remain free from infection Outcome: Adequate for Discharge Goal: Diagnostic test results will improve Outcome: Adequate for Discharge Goal: Respiratory complications will improve Outcome: Adequate for Discharge Goal: Cardiovascular complication will be avoided Outcome: Adequate for Discharge   Problem: Activity: Goal: Risk for activity intolerance will decrease Outcome: Adequate for Discharge   Problem: Nutrition: Goal: Adequate nutrition will be maintained Outcome: Adequate for Discharge   Problem: Coping: Goal: Level of anxiety will decrease Outcome: Adequate for Discharge   Problem: Elimination: Goal: Will not experience complications related to bowel motility Outcome: Adequate for Discharge Goal: Will not experience complications related to urinary retention Outcome: Adequate for Discharge   Problem: Pain Managment: Goal: General experience of comfort will improve Outcome: Adequate for Discharge   Problem: Safety: Goal: Ability to remain free from injury will improve Outcome: Adequate for Discharge   Problem: Skin Integrity: Goal: Risk for impaired skin integrity will decrease Outcome: Adequate for Discharge   Problem: Acute Rehab PT Goals(only PT should resolve) Goal: Pt Will Go Supine/Side To Sit Outcome: Adequate for Discharge Goal: Patient Will Transfer Sit To/From Stand Outcome: Adequate for Discharge Goal: Pt Will Perform Standing Balance Or  Pre-Gait Outcome: Adequate for Discharge Goal: Pt Will Ambulate Outcome: Adequate for Discharge   Problem: Acute Rehab OT Goals (only OT should resolve) Goal: Pt. Will Perform Grooming Outcome: Adequate for Discharge Goal: Pt. Will Perform Upper Body Dressing Outcome: Adequate for Discharge Goal: Pt. Will Perform Lower Body Dressing Outcome: Adequate for Discharge Goal: Pt. Will Transfer To Toilet Outcome: Adequate for Discharge Goal: Pt/Caregiver Will Perform Home Exercise Program Outcome: Adequate for Discharge

## 2022-05-06 NOTE — Progress Notes (Signed)
Physical Therapy Treatment Patient Details Name: Kaitlyn Gray MRN: [id number] DOB: [birth year] Today's Date: 05/06/2022   History of Present Illness 46 yo female admitted 11/16 with slurred speech and falls, cocaine (+). MRI without acute infarcts. 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 She required min guard assist transfers, and min assist ambulation 125' with rollator. Unsteady, ataxic gait with occasional partial knee buckling. Cues needed to stay close to rollator. Pt returned to sitting EOB at end of session (declined recliner).    Recommendations for follow up therapy are one component of a multi-disciplinary discharge planning process, led by the attending physician.  Recommendations may be updated based on patient status, additional functional criteria and insurance authorization.  Follow Up Recommendations  Skilled nursing-short term rehab (<3 hours/day) Can patient physically be transported by private vehicle: Yes   Assistance Recommended at Discharge Frequent or constant Supervision/Assistance  Patient can return home with the following Assistance with cooking/housework;Direct supervision/assist for medications management;Direct supervision/assist for financial management;Assist for transportation;Help with stairs or ramp for entrance;A little help with bathing/dressing/bathroom;A little help with walking and/or transfers   Equipment Recommendations  Rollator (4 wheels)    Recommendations for Other Services       Precautions / Restrictions Precautions Precautions: Fall Precaution Comments: pt reports >10 falls in 6 mo     Mobility  Bed Mobility               General bed mobility comments: Pt sitting EOB    Transfers Overall transfer level: Needs assistance Equipment used: Rollator (4 wheels) Transfers: Sit to/from Stand Sit to Stand: Min guard                 Ambulation/Gait Ambulation/Gait assistance: Min Chemical engineer (Feet): 125 Feet Assistive device: Rollator (4 wheels) Gait Pattern/deviations: Step-through pattern, Ataxic Gait velocity: decreased Gait velocity interpretation: <1.8 ft/sec, indicate of risk for recurrent falls   General Gait Details: Unsteady. Partial knee buckling at times. Cues to stay close to rollator.   Stairs             Wheelchair Mobility    Modified Rankin (Stroke Patients Only)       Balance Overall balance assessment: Needs assistance Sitting-balance support: Feet supported, No upper extremity supported Sitting balance-Leahy Scale: Good     Standing balance support: Bilateral upper extremity supported, Reliant on assistive device for balance, During functional activity Standing balance-Leahy Scale: Poor                              Cognition Arousal/Alertness: Awake/alert Behavior During Therapy: Flat affect Overall Cognitive Status: No family/caregiver present to determine baseline cognitive functioning Area of Impairment: Safety/judgement, Problem solving                         Safety/Judgement: Decreased awareness of safety, Decreased awareness of deficits   Problem Solving: Difficulty sequencing, Requires verbal cues, Slow processing, Decreased initiation          Exercises      General Comments        Pertinent Vitals/Pain Pain Assessment Pain Assessment: Faces Faces Pain Scale: No hurt    Home Living  Prior Function            PT Goals (current goals can now be found in the care plan section) Acute Rehab PT Goals Patient Stated Goal: home Progress towards PT goals: Progressing toward goals    Frequency    Min 2X/week      PT Plan Current plan remains appropriate    Co-evaluation              AM-PAC PT "6 Clicks" Mobility   Outcome Measure  Help needed turning  from your back to your side while in a flat bed without using bedrails?: None Help needed moving from lying on your back to sitting on the side of a flat bed without using bedrails?: None Help needed moving to and from a bed to a chair (including a wheelchair)?: A Little Help needed standing up from a chair using your arms (e.g., wheelchair or bedside chair)?: A Little Help needed to walk in hospital room?: A Little Help needed climbing 3-5 steps with a railing? : Total 6 Click Score: 18    End of Session Equipment Utilized During Treatment: Gait belt Activity Tolerance: Patient tolerated treatment well Patient left: in bed;with bed alarm set;with call bell/phone within reach (sitting EOB) Nurse Communication: Mobility status PT Visit Diagnosis: Unsteadiness on feet (R26.81);Other abnormalities of gait and mobility (R26.89);Muscle weakness (generalized) (M62.81);History of falling (Z91.81);Ataxic gait (R26.0)     Time: 1004-1020 PT Time Calculation (min) (ACUTE ONLY): 16 min  Charges:  $Gait Training: 8-22 mins                     Aida Raider, [place]  Office # [phone] Pager [phone]    Ilda Foil 05/06/2022, 10:35 AM

## 2022-05-06 NOTE — Discharge Instructions (Signed)
Follow with Primary MD   Get CBC, CMP, checked  by Primary MD next visit.     Disposition Home    Diet: Heart Healthy  On your next visit with your primary care physician please Get Medicines reviewed and adjusted.   Please request your Prim.MD to go over all Hospital Tests and Procedure/Radiological results at the follow up, please get all Hospital records sent to your Prim MD by signing hospital release before you go home.   If you experience worsening of your admission symptoms, develop shortness of breath, life threatening emergency, suicidal or homicidal thoughts you must seek medical attention immediately by calling 911 or calling your MD immediately  if symptoms less severe.  You Must read complete instructions/literature along with all the possible adverse reactions/side effects for all the Medicines you take and that have been prescribed to you. Take any new Medicines after you have completely understood and accpet all the possible adverse reactions/side effects.   Do not drive, operating heavy machinery, perform activities at heights, swimming or participation in water activities or provide baby sitting services if your were admitted for syncope or siezures until you have seen by Primary MD or a Neurologist and advised to do so again.  Do not drive when taking Pain medications.    Do not take more than prescribed Pain, Sleep and Anxiety Medications  Special Instructions: If you have smoked or chewed Tobacco  in the last 2 yrs please stop smoking, stop any regular Alcohol  and or any Recreational drug use.  Wear Seat belts while driving.   Please note  You were cared for by a hospitalist during your hospital stay. If you have any questions about your discharge medications or the care you received while you were in the hospital after you are discharged, you can call the unit and asked to speak with the hospitalist on call if the hospitalist that took care of you is not  available. Once you are discharged, your primary care physician will handle any further medical issues. Please note that NO REFILLS for any discharge medications will be authorized once you are discharged, as it is imperative that you return to your primary care physician (or establish a relationship with a primary care physician if you do not have one) for your aftercare needs so that they can reassess your need for medications and monitor your lab values.

## 2022-05-06 NOTE — Progress Notes (Signed)
MD Elgergawy notified pt refusing blood sugar checks and insulin

## 2022-05-06 NOTE — Discharge Summary (Signed)
Physician Discharge Summary  Kaitlyn Gray BTD:[id number] DOB: 1976-02-14 DOA: 05/01/2022  PCP: Patient, No Pcp Per  Admit date: 05/01/2022 Discharge date: 05/06/2022  Admitted From: (Home) Disposition:  (Home )  Recommendations for Outpatient Follow-up:  Follow up with PCP, appointment scheduled for 12/12, new PCP has been established Please obtain BMP/CBC in one week Continue counseling about substance abuse .  Patient follow-up with outpatient PT/OT Ambulatory referral has been sent to neurology Continue aspirin and Plavix for 110-month then Plavix alone  Discharge recommendation has been made for SNF, but no bed is available, and patient is ready availability, she wants to leave today, even AMA, so we will go ahead and discharge the patient with maximum support could be provided including outpatient PT,/OT, [facility].  Discharge Condition: (Stable) CODE STATUS: (FULL) Diet recommendation: Heart Healthy / Carb Modified  Brief/Interim Summary:  46year old female with significant past medical history which includes recent CVA with MRI of the brain shows cluster of acute infarction of the left frontal parietal vertex, punctate acute infarct of the right medial parietal lobes and old midbrain, cerebellar and cerebral infarct, along with CTA of the head and neck show significant intracranial and extracranial stenosis. On 10/15 discharged. Returned  on 10/20and was awaiting SNF placement - left AMA. Additional history of diabetes, hypertension, polysubstance use including marijuana cocaine tobacco use, noncompliance. She is  brought to [facility] via EMS from home for complaints of increased speech difficulty.  History difficult to obtain due to patient's aphasia.  Patient mention for the past several days she has had difficulty ambulating despite using her walker.  She has fallen multiple times with her legs  giving out.  She also noticed more difficulty with her speech since yesterday.  Patient does not think she has any significant injury from her falls her MRI was significant for acute CVA.      Subacute CVA -Patient with recent hospitalization for acute CVA, for which she has signed AMA. -Is noncompliant with any of her medications. -Still with polysubstance abuse, positive for cocaine which likely contributed to this acute CVA. -She had recent work-up including CTA which was significant for sclerosis with a right mid ICA 70% stenosis. -No need to repeat work-up, resumed on aspirin and Plavix, to continue for 3 months then Plavix alone. -Started on statin. -PT/OT/SLP reconsulted,  will need SNF placement but no bed availability, so she will be discharged home upon her insistence.   Hypertension associated with diabetes ([facility] - Continue home meds, remains elevated, will increase Norvasc to 10 mg oral daily.   Type 2 diabetes mellitus ([facility] - Last A1C 03/30/22 6.6% -Resumed on metformin, started on glipizide            Polysubstance [place] ([facility] - Hx of polysubstance abuse including THC, cocaine, tobacco.  Bipolar disorder ([facility] - Long standing problem. 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 Unit Evaluation and Dispense as Indicated: No   Diet - low sodium heart healthy   Complete by: As directed     Discharge instructions   Complete by: As directed    Follow with Primary MD   Get CBC, CMP, checked  by Primary MD next visit.     Disposition Home    Diet: Heart Healthy  On your next visit with your primary care physician please Get Medicines reviewed and adjusted.   Please request your Prim.MD to go over all Hospital Tests and Procedure/Radiological results at the follow up, please get all Hospital records sent to your Prim MD by signing hospital release before you go home.   If you experience worsening of your admission symptoms, develop shortness of breath, life threatening emergency, suicidal or homicidal thoughts you must seek medical attention immediately by calling 911 or calling your MD immediately  if symptoms less severe.  You Must read complete instructions/literature along with all the possible adverse reactions/side effects for all the Medicines you take and that have been prescribed to you. Take any new Medicines after you have completely understood and accpet all the possible adverse reactions/side effects.   Do not drive, operating heavy machinery, perform activities at heights, swimming or participation in water activities or provide baby sitting services if your were admitted for syncope or siezures until you have seen by Primary MD or a Neurologist and advised to do so again.  Do not drive when taking Pain medications.    Do not take more than prescribed Pain, Sleep and Anxiety Medications  Special Instructions: If you have smoked or chewed Tobacco  in the last 2 yrs please stop smoking, stop any regular Alcohol  and or any Recreational drug use.  Wear Seat belts while driving.   Please note  You were cared for by a hospitalist during your hospital stay. If you have any questions about your discharge medications or the care you received while you were in the hospital after you are discharged, you can call the unit and asked to speak with the hospitalist on  call if the hospitalist that took care of you is not available. Once you are discharged, your primary care physician will handle any further medical issues. Please note that NO REFILLS for any discharge medications will be authorized once you are discharged, as it is imperative that you return to your primary care physician (or establish a relationship with a primary care physician if you do not have one) for your aftercare needs so that they can reassess your need for medications and monitor your lab values.   Increase activity slowly   Complete by: As directed       Allergies as of 05/06/2022       Reactions   Hydrocodone Itching   Latex Itching, Rash        Medication List     TAKE these medications    amLODipine 10 MG tablet Commonly known as: NORVASC Take 1 tablet (10 mg total) by mouth daily. Start taking on: May 07, 2022 What changed:  medication strength how much to take   Aspirin Low Dose 81 MG tablet Generic drug: aspirin EC Take 1 tablet (81 mg total) by mouth daily. Swallow whole.   blood glucose meter kit and supplies Rel[employer] Prime or Dispense other brand based on patient and insurance preference. Use up to four times daily as directed. (  FOR ICD-9 250.00, 250.01).   clopidogrel 75 MG tablet Commonly known as: PLAVIX Take 1 tablet (75 mg total) by mouth daily.   FeroSul 325 (65 FE) MG tablet Generic drug: ferrous sulfate Take 1 tablet (325 mg total) by mouth every other day.   gabapentin 400 MG capsule Commonly known as: NEURONTIN Take 1 capsule (400 mg total) by mouth 2 (two) times daily.   glipiZIDE 5 MG tablet Commonly known as: GLUCOTROL Take 0.5 tablets (2.5 mg total) by mouth daily before breakfast. Start taking on: May 07, 2022   Insulin Pen Needle 31G X 5 MM Misc Use as directed   losartan 100 MG tablet Commonly known as: COZAAR Take 1 tablet (100 mg total) by mouth daily. Start taking on: May 07, 2022 What changed:   medication strength how much to take   metFORMIN 500 MG 24 hr tablet Commonly known as: GLUCOPHAGE-XR Take 1 tablet (500 mg total) by mouth 2 (two) times daily.   pantoprazole 40 MG tablet Commonly known as: PROTONIX Take 1 tablet (40 mg total) by mouth daily. Start taking on: May 07, 2022   QUEtiapine 200 MG tablet Commonly known as: SEROQUEL Take 200 mg by mouth at bedtime.   rosuvastatin 40 MG tablet Commonly known as: CRESTOR Take 1 tablet (40 mg total) by mouth daily.        Follow-up Information     [facility] Follow up on 05/27/2022.   Specialty: Family Medicine Why: You are scheduled for PCP follow up with Juluis Mire, MD at the clinic. Contact information: [place] [id number] [phone]        [facility].   Specialty: Rehabilitation Why: Please call the Outpatient rehabilitation center for follow up therapy time.  Medicaid transportation will provide transportation for therapy - please call 24-48 hours prior to your therapy time to schedule transport. Contact information: [address] [id number] mc [place] [address] [phone]               Allergies  Allergen Reactions   Hydrocodone Itching   Latex Itching and Rash    Consultations: none   Procedures/Studies: MR BRAIN WO CONTRAST  Result Date: 05/01/2022 CLINICAL DATA:  Neuro deficit, stroke suspected. EXAM: MRI HEAD WITHOUT CONTRAST TECHNIQUE: Multiplanar, multiecho pulse sequences of the brain and surrounding structures were obtained without intravenous contrast. COMPARISON:  Same-day CT head, MR head 03/30/2022 FINDINGS: Brain: There is no acute intracranial hemorrhage, extra-axial fluid collection, or acute infarct. Background parenchymal volume is stable. The ventricles are stable in size. There are prior infarcts in the right occipital lobe,  high left frontoparietal cortex near the vertex, pons, and right cerebellar hemisphere/middle cerebellar peduncle. The high left frontoparietal cortical infarcts are late subacute to early chronic (acute on the MRI from 03/30/2022). There is additional patchy and confluence FLAIR signal abnormality in the supratentorial white matter likely reflecting sequela of underlying chronic small-vessel ischemic change, stable. Small foci of chronic blood products in the thalami are unchanged. There is no mass lesion.  There is no mass effect or midline shift. Vascular: Choose Skull and upper cervical spine: Normal marrow signal. Sinuses/Orbits: There is mild mucosal thickening in the paranasal sinuses. The globes and orbits are unremarkable. Other: None. IMPRESSION: 1. No acute intracranial pathology. 2. Late subacute to early chronic infarcts in the high left frontoparietal cortex. Additional remote infarcts and age advanced background chronic small-vessel ischemic change as above. Electronically Signed  By: Valetta Mole M.D.   On: 05/01/2022 14:51   CT HEAD WO CONTRAST  Result Date: 05/01/2022 CLINICAL DATA:  Acute neurologic deficit with altered speech and difficulty walking. EXAM: CT HEAD WITHOUT CONTRAST TECHNIQUE: Contiguous axial images were obtained from the base of the skull through the vertex without intravenous contrast. RADIATION DOSE REDUCTION: This exam was performed according to the departmental dose-optimization program which includes automated exposure control, adjustment of the mA and/or kV according to patient size and/or use of iterative reconstruction technique. COMPARISON:  04/06/2022 FINDINGS: Brain: Remote right occipital infarct. More recent high left parietal lobe infarcts as shown on MRI from 03/30/2022. The regions of poor gray-white differentiation in the high left parietal region appear roughly similar to those demonstrated on the prior MRI. No new infarct is currently identified.  Periventricular white matter and corona radiata hypodensities favor chronic ischemic microvascular white matter disease. Otherwise, the brainstem, cerebellum, cerebral peduncles, thalamus, basal ganglia, basilar cisterns, and ventricular system appear within normal limits. No intracranial hemorrhage, mass lesion, or acute CVA. Vascular: Unremarkable Skull: Unremarkable Sinuses/Orbits: Chronic bilateral maxillary, bilateral ethmoid, and left sphenoid sinusitis. Old healed right medial orbital wall fracture. Other: No supplemental non-categorized findings. IMPRESSION: 1. No acute intracranial findings. 2. Remote right occipital infarct. 3. More recent late subacute/early chronic high left parietal lobe infarcts as shown on MRI from 03/30/2022. 4. Chronic ischemic microvascular white matter disease. 5. Chronic paranasal sinusitis. 6. Old healed right medial orbital wall fracture. Electronically Signed   By: Van Clines M.D.   On: 05/01/2022 10:24   DG Chest Port 1 View  Result Date: 04/12/2022 CLINICAL DATA:  46 year old female with chest pain and shortness of breath since last night. EXAM: PORTABLE CHEST 1 VIEW COMPARISON:  Portable chest 02/28/2022 and earlier. FINDINGS: Portable AP semi upright view at 0913 hours. Lower lung volumes. Stable mild cardiomegaly. Other mediastinal contours are within normal limits. Visualized tracheal air column is within normal limits. Allowing for portable technique the lungs are clear. No pneumothorax or pleural effusion. Negative visible bowel gas and osseous structures. IMPRESSION: Lower lung volumes.  No acute cardiopulmonary abnormality. Electronically Signed   By: Genevie Ann M.D.   On: 04/12/2022 09:24   (Echo, Carotid, EGD, Colonoscopy, ERCP)    Subjective: No significant events overnight, she is asking to go home today, she is adamant about leaving today, does not want to wait for SNF, and unfortunately no home health can be arranged, but she reports she will  follow with outpatient PT/OT,  Discharge Exam: Vitals:   05/06/22 0423 05/06/22 0712  BP: (!) 149/83 (!) 162/80  Pulse: 75 86  Resp: 18 15  Temp: (!) 97.5 F (36.4 C)   SpO2: 100% 99%   Vitals:   05/05/22 1718 05/05/22 1933 05/06/22 0423 05/06/22 0712  BP: (!) 153/80 (!) 157/90 (!) 149/83 (!) 162/80  Pulse: 77 79 75 86  Resp: _0 Temp: 98.1 F (36.7 C) 97.8 F (36.6 C) (!) 97.5 F (36.4 C)   TempSrc: Oral Oral Oral   SpO2: 100% 100% 100% 99%  Weight:      Height:        General: Pt is alert, awake, not in acute distress, ill with dysarthria, speech is comprehendible, but at slow pace Cardiovascular: RRR, S1/S2 +, no rubs, no gallops Respiratory: CTA bilaterally, no wheezing, no rhonchi Abdominal: Soft, NT, ND, bowel sounds + Extremities: no edema, no cyanosis    The results of significant diagnostics from  this hospitalization (including imaging, microbiology, ancillary and laboratory) are listed below for reference.     Microbiology: No results found for this or any previous visit (from the past 240 hour(s)).   Labs: BNP (last 3 results) No results for input(s): "BNP" in the last 8760 hours. Basic Metabolic Panel: Recent Labs  Lab 04/29/22 2056 05/01/22 1028 05/01/22 1038  NA 139 142 142  K 3.4* 3.4* 3.4*  CL 104 108 105  CO2 24 27  --   GLUCOSE 100* 111* 109*  BUN _0 CREATININE 0.81 1.00 0.90  CALCIUM 9.1 9.0  --    Liver Function Tests: Recent Labs  Lab 04/29/22 2056 05/01/22 1028  AST 14* 15  ALT 11 12  ALKPHOS 55 56  BILITOT 0.3 0.5  PROT 7.3 6.8  ALBUMIN 3.8 3.5   No results for input(s): "LIPASE", "AMYLASE" in the last 168 hours. No results for input(s): "AMMONIA" in the last 168 hours. CBC: Recent Labs  Lab 04/29/22 2056 05/01/22 1028 05/01/22 1038  WBC 5.0 4.3  --   NEUTROABS 2.7 2.4  --   HGB 10.0* 9.6* 11.6*  HCT 35.1* 32.2* 34.0*  MCV 79.2* 77.6*  --   PLT 315 249  --    Cardiac Enzymes: No results for  input(s): "CKTOTAL", "CKMB", "CKMBINDEX", "TROPONINI" in the last 168 hours. BNP: Invalid input(s): "POCBNP" CBG: Recent Labs  Lab 05/05/22 0712 05/05/22 1130 05/05/22 1714 05/05/22 1934 05/06/22 0714  GLUCAP 108* 189* 198* 162* 117*   D-Dimer No results for input(s): "DDIMER" in the last 72 hours. Hgb A1c No results for input(s): "HGBA1C" in the last 72 hours. Lipid Profile No results for input(s): "CHOL", "HDL", "LDLCALC", "TRIG", "CHOLHDL", "LDLDIRECT" in the last 72 hours. Thyroid function studies No results for input(s): "TSH", "T4TOTAL", "T3FREE", "THYROIDAB" in the last 72 hours.  Invalid input(s): "FREET3" Anemia work up No results for input(s): "VITAMINB12", "FOLATE", "FERRITIN", "TIBC", "IRON", "RETICCTPCT" in the last 72 hours. Urinalysis    Component Value Date/Time   COLORURINE YELLOW 04/09/2022 1630   APPEARANCEUR HAZY (A) 04/09/2022 1630   LABSPEC 1.018 04/09/2022 1630   PHURINE 6.0 04/09/2022 1630   GLUCOSEU NEGATIVE 04/09/2022 1630   HGBUR NEGATIVE 04/09/2022 1630   BILIRUBINUR NEGATIVE 04/09/2022 1630   KETONESUR NEGATIVE 04/09/2022 1630   PROTEINUR NEGATIVE 04/09/2022 1630   UROBILINOGEN 0.2 10/06/2014 0856   NITRITE NEGATIVE 04/09/2022 1630   LEUKOCYTESUR NEGATIVE 04/09/2022 1630   Sepsis Labs Recent Labs  Lab 04/29/22 2056 05/01/22 1028  WBC 5.0 4.3   Microbiology No results found for this or any previous visit (from the past 240 hour(s)).   Time coordinating discharge: Over 30 minutes  SIGNED:   Phillips Climes, MD  [facility] 05/06/2022, 3:17 PM Pager   If 7PM-7AM, please contact night-coverage [email or web address]

## 2022-05-27 ENCOUNTER — Inpatient Hospital Stay (INDEPENDENT_AMBULATORY_CARE_PROVIDER_SITE_OTHER): Payer: Medicaid Other | Admitting: Primary Care

## 2022-05-27 ENCOUNTER — Ambulatory Visit (INDEPENDENT_AMBULATORY_CARE_PROVIDER_SITE_OTHER): Payer: Self-pay | Admitting: *Deleted

## 2022-05-27 NOTE — Telephone Encounter (Signed)
Scheduled apt for patient to go to [facility].  Patient advise of appt.  Advised to call office if she does not think she can make apt d/t transportation issues.   Made aware of falls. Advise to notify medical personnel at ED if she has falls d/t being on Plavix.

## 2022-05-27 NOTE — Telephone Encounter (Signed)
  Chief Complaint: multiple falls. Missed hospital f/u appt today  Symptoms: fell last night walking with walker per patient. Fell on knees and reports pain denies injury. Denies hitting head.  Slurred speech worsening throughout call. C/o not eating and no assistance at home. No money or access to get help buying food. Reports forgetting to take medications. Hx diabetes. Did not answer when asked last time checking blood sugar. Hx stroke left side weak Frequency: last night  Pertinent Negatives: Patient denies hitting head from multiple falls. Able to talk to NT and no difficulty breathing heard. No report of chest pain.  Disposition: [x] ED /[] Urgent Care (no appt availability in office) / [] Appointment(In office/virtual)/ []  [facility]/ [] Home Care/ [x] Refused Recommended Disposition /[] [facility]/ []  Follow-up with PCP Additional Notes:   NT call 911 due to unable to get response needed from patient regarding last time she ate , what blood sugar is today. Worsening slurred speech noted as conversation went on. Patient c/o she has not food no money no transportation to get food. Reports forgetting to take medication. Called 911 to come out to check blood sugar and do well visit check. Patient requesting assistance to get a caregiver. Recommended patient go back to hospital and patient became angry and refused. Please advise regarding patient safety issues caring for self.    Reason for Disposition  Patient sounds very sick or weak to the triager  Answer Assessment - Initial Assessment Questions 1. MECHANISM: "How did the fall happen?"     Fell off of the couch .  2. DOMESTIC VIOLENCE AND ELDER ABUSE SCREENING: "Did you fall because someone pushed you or tried to hurt you?" If Yes, ask: "Are you safe now?"     na 3. ONSET: "When did the fall happen?" (e.g., minutes, hours, or days ago)     Walking after sleeping  4. LOCATION: "What part of the body hit the ground?"  (e.g., back, buttocks, head, hips, knees, hands, head, stomach)     knees 5. INJURY: "Did you hurt (injure) yourself when you fell?" If Yes, ask: "What did you injure? Tell me more about this?" (e.g., body area; type of injury; pain severity)"     Denies  6. PAIN: "Is there any pain?" If Yes, ask: "How bad is the pain?" (e.g., Scale 1-10; or mild,  moderate, severe)   - NONE (0): No pain   - MILD (1-3): Doesn't interfere with normal activities    - MODERATE (4-7): Interferes with normal activities or awakens from sleep    - SEVERE (8-10): Excruciating pain, unable to do any normal activities      Pain in feet and legs feels like can not walk 7. SIZE: For cuts, bruises, or swelling, ask: "How large is it?" (e.g., inches or centimeters)      na 8. PREGNANCY: "Is there any chance you are pregnant?" "When was your last menstrual period?"     na 9. OTHER SYMPTOMS: "Do you have any other symptoms?" (e.g., dizz     Increased slurred speech unknown blood sugar. Reports not eating  10. CAUSE: "What do you think caused the fall (or falling)?" (e.g., tripped, dizzy spell)       Unknown. Hx stroke. Left side weakness. Poor safety awareness. Diabetic  Protocols used: Falls and [facility]

## 2022-05-30 NOTE — Therapy (Deleted)
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    Encounter for assessment of healthcare decision-making capacity    History of cervical fracture 12/24/2017   nondisplaced fracture lateral mass of C1 on the right on CT 12/24/17   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) 07/19/2017   Hypertension    Ischemic stroke (HCC) 01/01/2020   subacute right middle cerebellar peduncle and pons infarction   Left-sided weakness 01/27/2022   Head CT with remote right occipital infarct which is consistent with old left-sided weakness   MDD (major depressive disorder), recurrent severe, 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   Normocytic anemia 02/09/2020   Opiate use    History of Suboxone Therapy until 05/2021   Polysubstance abuse (HCC) 07/19/2017   Prescription drug misadventures (Seroquel)    Stroke [facility])    Past Surgical History:  Procedure Laterality Date   CESAREAN SECTION     Patient Active Problem List   Diagnosis Date Noted   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) 05/01/2022   Iron deficiency anemia 04/07/2022   Fall at home, initial encounter 04/07/2022   Ambulatory dysfunction 04/07/2022   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 04/07/2022   Stroke (cerebrum) (HCC) 04/06/2022   Class 1 obesity 04/06/2022   Tobacco abuse 04/06/2022   Abdominal  pain 04/04/2022   History of stroke 03/31/2022   Hemiparesis affecting left side as late effect of cerebrovascular accident (CVA) (HCC) 03/31/2022   Opiate use    Microcytic anemia 03/30/2022   Chronic anemia    Noncompliance with medications    CVA (cerebral vascular accident) (HCC) 09/06/2020   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   History of cervical fracture 12/24/2017   Type 2 diabetes mellitus (HCC) 12/06/2017   Hypertension associated with diabetes (HCC) 12/06/2017   Bipolar disorder (HCC) 07/19/2017   Polysubstance abuse (HCC) 07/19/2017   Cocaine use disorder, severe, dependence (HCC) 03/24/2015   Depression 03/24/2015   Cannabis abuse 03/23/2013    ONSET DATE: 05-01-22   REFERRING DIAG: W09.811 (ICD-10-CM) - 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  Rationale for Evaluation and Treatment: Rehabilitation  SUBJECTIVE:   SUBJECTIVE STATEMENT: *** Pt accompanied by: {accompnied:27141}  PERTINENT HISTORY: MRI shows Cluster of acute infarctions at the left frontoparietal vertex. Punctate acute infarctions in the right medial parietal lobe. Findings may represent anterior cerebral artery territory insults. No large confluent infarction. No hemorrhage or mass effect. Pt has a history of cognitive impairment and oral dysphagia from prior CVA.   PAIN:  Are you having pain? {OPRCPAIN:27236}  FALLS: Has patient fallen in last 6 months?  {BJYNWGNF:62130}  LIVING ENVIRONMENT: Lives with: {OPRC lives with:25569::"lives with their family"} Lives in: {Lives in:25570}  PLOF:  Level of assistance: {QMVHQIO:96295} Employment: {SLPemployment:25674}  PATIENT GOALS: ***  OBJECTIVE:   DIAGNOSTIC FINDINGS: 05-01-22 MRI IMPRESSION: 1. No acute  intracranial pathology. 2. Late subacute to early chronic infarcts in the high left frontoparietal cortex. Additional remote infarcts and age  advanced background chronic small-vessel ischemic change as above.  COGNITION: Overall cognitive status: {cognition:24006} Areas of impairment:  {cognitiveimpairmentslp:27409} Functional deficits: ***  COGNITIVE COMMUNICATION: Following directions: {commands:24018}  Auditory comprehension: {WFL-Impaired:25365} Verbal expression: {WFL-Impaired:25365} Functional communication: {WFL-Impaired:25365}  ORAL MOTOR EXAMINATION: Overall status: {OMESLP2:27645} Comments: ***  MOTOR SPEECH: assessed across variety of speech tasks: reading, word repetition, generative discourse sample Overall motor speech: {slpimpaired:27210} Level of impairment: {SLP level of impairment:25441} Rate of Speech: {slprateofspeech:28668} Dysfluencies: {SLP dysfluncies:28669} Phonation: {SLP phonation:25439} Oral reading loudness average: *** dB Conversational loudness average: *** dB Voice Quality: {VQL:27192} Respiration: {respbreathing:27195} Word and Phrasal Stress: {SLP stress:28670} Resonance: {SLP resonance:25440} Articulation: {SLParticulation:27218} Diadochokinetic Rate (DDK): {SLP HAL:93790} Intelligibility: {SLP Intelligible:25442} Motor planning: {slpmotorspeecherrors:27220} Interfering components: {SLP Interfering components (MS):25444} Effective technique: {SLP effective technique (MS):25445}   STANDARDIZED ASSESSMENTS: {SLPstandardizedassessment:27092}  PATIENT REPORTED OUTCOME MEASURES (PROM): {SLPPROM:27095}   TODAY'S TREATMENT: 06-02-22: ***   PATIENT EDUCATION: Education details: *** Person educated: {Person educated:25204} Education method: {Education Method:25205} Education comprehension: {Education Comprehension:25206}   GOALS: Goals reviewed with patient? {yes/no:20286}  SHORT TERM GOALS: Target date: ***  *** Baseline: Goal status: {GOALSTATUS:25110}  2.  *** Baseline:  Goal status: {GOALSTATUS:25110}  3.  *** Baseline:  Goal status:  {GOALSTATUS:25110}  4.  *** Baseline:  Goal status: {GOALSTATUS:25110}  5.  *** Baseline:  Goal status: {GOALSTATUS:25110}  6.  *** Baseline:  Goal status: {GOALSTATUS:25110}  LONG TERM GOALS: Target date: ***  *** Baseline:  Goal status: {GOALSTATUS:25110}  2.  *** Baseline:  Goal status: {GOALSTATUS:25110}  3.  *** Baseline:  Goal status: {GOALSTATUS:25110}  4.  *** Baseline:  Goal status: {GOALSTATUS:25110}  5.  *** Baseline:  Goal status: {GOALSTATUS:25110}  6.  *** Baseline:  Goal status: {GOALSTATUS:25110}  ASSESSMENT:  CLINICAL IMPRESSION: Patient is a *** y.o. *** who was seen today for ***.   OBJECTIVE IMPAIRMENTS: include {SLPOBJIMP:27107}. These impairments are limiting patient from {SLPLIMIT:27108}. Factors affecting potential to achieve goals and functional outcome are {SLP factors:25450}.. Patient will benefit from skilled SLP services to address above impairments and improve overall function.  REHAB POTENTIAL: {rehabpotential:25112}  PLAN:  SLP FREQUENCY: {rehab frequency:25116}  SLP DURATION: {rehab duration:25117}  PLANNED INTERVENTIONS: {SLP treatment/interventions:25449}    Maia Breslow, CCC-SLP 05/30/2022, 11:54 AM

## 2022-05-31 ENCOUNTER — Emergency Department (HOSPITAL_COMMUNITY): Payer: Medicaid Other

## 2022-05-31 ENCOUNTER — Encounter (HOSPITAL_COMMUNITY): Payer: Self-pay | Admitting: Pulmonary Disease

## 2022-05-31 ENCOUNTER — Observation Stay (HOSPITAL_COMMUNITY)
Admission: EM | Admit: 2022-05-31 | Discharge: 2022-06-04 | Disposition: A | Discharge status: Home / Self Care | Payer: Medicaid Other | Attending: Internal Medicine | Admitting: Internal Medicine

## 2022-05-31 DIAGNOSIS — Z7984 Long term (current) use of oral hypoglycemic drugs: Secondary | ICD-10-CM | POA: Diagnosis not present

## 2022-05-31 DIAGNOSIS — F17210 Nicotine dependence, cigarettes, uncomplicated: Secondary | ICD-10-CM | POA: Insufficient documentation

## 2022-05-31 DIAGNOSIS — Z7982 Long term (current) use of aspirin: Secondary | ICD-10-CM | POA: Insufficient documentation

## 2022-05-31 DIAGNOSIS — I161 Hypertensive emergency: Secondary | ICD-10-CM

## 2022-05-31 DIAGNOSIS — I674 Hypertensive encephalopathy: Secondary | ICD-10-CM | POA: Diagnosis not present

## 2022-05-31 DIAGNOSIS — F1410 Cocaine abuse, uncomplicated: Secondary | ICD-10-CM | POA: Diagnosis not present

## 2022-05-31 DIAGNOSIS — F1910 Other psychoactive substance abuse, uncomplicated: Secondary | ICD-10-CM | POA: Diagnosis present

## 2022-05-31 DIAGNOSIS — Z8673 Personal history of transient ischemic attack (TIA), and cerebral infarction without residual deficits: Secondary | ICD-10-CM | POA: Diagnosis not present

## 2022-05-31 DIAGNOSIS — I69354 Hemiplegia and hemiparesis following cerebral infarction affecting left non-dominant side: Secondary | ICD-10-CM

## 2022-05-31 DIAGNOSIS — E1165 Type 2 diabetes mellitus with hyperglycemia: Secondary | ICD-10-CM | POA: Diagnosis not present

## 2022-05-31 DIAGNOSIS — Z7902 Long term (current) use of antithrombotics/antiplatelets: Secondary | ICD-10-CM | POA: Diagnosis not present

## 2022-05-31 DIAGNOSIS — G929 Unspecified toxic encephalopathy: Secondary | ICD-10-CM | POA: Diagnosis not present

## 2022-05-31 DIAGNOSIS — I10 Essential (primary) hypertension: Secondary | ICD-10-CM | POA: Diagnosis not present

## 2022-05-31 DIAGNOSIS — D509 Iron deficiency anemia, unspecified: Secondary | ICD-10-CM | POA: Insufficient documentation

## 2022-05-31 DIAGNOSIS — R4182 Altered mental status, unspecified: Secondary | ICD-10-CM | POA: Diagnosis present

## 2022-05-31 DIAGNOSIS — E119 Type 2 diabetes mellitus without complications: Secondary | ICD-10-CM

## 2022-05-31 DIAGNOSIS — Z794 Long term (current) use of insulin: Secondary | ICD-10-CM | POA: Diagnosis not present

## 2022-05-31 DIAGNOSIS — I152 Hypertension secondary to endocrine disorders: Secondary | ICD-10-CM | POA: Diagnosis present

## 2022-05-31 DIAGNOSIS — F1420 Cocaine dependence, uncomplicated: Secondary | ICD-10-CM | POA: Diagnosis present

## 2022-05-31 DIAGNOSIS — G459 Transient cerebral ischemic attack, unspecified: Secondary | ICD-10-CM

## 2022-05-31 DIAGNOSIS — Z91040 Latex allergy status: Secondary | ICD-10-CM | POA: Diagnosis not present

## 2022-05-31 DIAGNOSIS — Z79899 Other long term (current) drug therapy: Secondary | ICD-10-CM | POA: Diagnosis not present

## 2022-05-31 DIAGNOSIS — E1159 Type 2 diabetes mellitus with other circulatory complications: Secondary | ICD-10-CM | POA: Diagnosis present

## 2022-05-31 HISTORY — DX: Hypertensive emergency: I16.1

## 2022-05-31 LAB — COMPREHENSIVE METABOLIC PANEL
ALT: 11 U/L (ref 0–44)
AST: 20 U/L (ref 15–41)
Albumin: 3.7 g/dL (ref 3.5–5.0)
Alkaline Phosphatase: 63 U/L (ref 38–126)
Anion gap: 9 (ref 5–15)
BUN: 13 mg/dL (ref 6–20)
CO2: 23 mmol/L (ref 22–32)
Calcium: 8.9 mg/dL (ref 8.9–10.3)
Chloride: 105 mmol/L (ref 98–111)
Creatinine, Ser: 0.76 mg/dL (ref 0.44–1.00)
GFR, Estimated: >60 mL/min (ref >60)
Glucose, Bld: 153 mg/dL — ABNORMAL HIGH (ref 70–99)
Potassium: 4.3 mmol/L (ref 3.5–5.1)
Sodium: 137 mmol/L (ref 135–145)
Total Bilirubin: 0.3 mg/dL (ref 0.3–1.2)
Total Protein: 7.1 g/dL (ref 6.5–8.1)

## 2022-05-31 LAB — ETHANOL: Alcohol, Ethyl (B): <10 mg/dL (ref <10)

## 2022-05-31 LAB — RAPID URINE DRUG SCREEN, HOSP PERFORMED
Amphetamines: NOT DETECTED
Barbiturates: NOT DETECTED
Benzodiazepines: NOT DETECTED
Cocaine: POSITIVE — AB
Opiates: NOT DETECTED
Tetrahydrocannabinol: NOT DETECTED

## 2022-05-31 LAB — I-STAT CHEM 8, ED
BUN: 15 mg/dL (ref 6–20)
Calcium, Ion: 1 mmol/L — ABNORMAL LOW (ref 1.15–1.40)
Chloride: 106 mmol/L (ref 98–111)
Creatinine, Ser: 0.7 mg/dL (ref 0.44–1.00)
Glucose, Bld: 154 mg/dL — ABNORMAL HIGH (ref 70–99)
HCT: 33 % — ABNORMAL LOW (ref 36.0–46.0)
Hemoglobin: 11.2 g/dL — ABNORMAL LOW (ref 12.0–15.0)
Potassium: 4.2 mmol/L (ref 3.5–5.1)
Sodium: 138 mmol/L (ref 135–145)
TCO2: 22 mmol/L (ref 22–32)

## 2022-05-31 LAB — DIFFERENTIAL
Abs Immature Granulocytes: 0.02 10*3/uL (ref 0.00–0.07)
Basophils Absolute: 0 10*3/uL (ref 0.0–0.1)
Basophils Relative: 1 %
Eosinophils Absolute: 0 10*3/uL (ref 0.0–0.5)
Eosinophils Relative: 1 %
Immature Granulocytes: 0 %
Lymphocytes Relative: 15 %
Lymphs Abs: 0.9 10*3/uL (ref 0.7–4.0)
Monocytes Absolute: 0.3 10*3/uL (ref 0.1–1.0)
Monocytes Relative: 5 %
Neutro Abs: 5.1 10*3/uL (ref 1.7–7.7)
Neutrophils Relative %: 78 %

## 2022-05-31 LAB — CBC
HCT: 32.4 % — ABNORMAL LOW (ref 36.0–46.0)
Hemoglobin: 9.7 g/dL — ABNORMAL LOW (ref 12.0–15.0)
MCH: 23.7 pg — ABNORMAL LOW (ref 26.0–34.0)
MCHC: 29.9 g/dL — ABNORMAL LOW (ref 30.0–36.0)
MCV: 79.2 fL — ABNORMAL LOW (ref 80.0–100.0)
Platelets: 256 10*3/uL (ref 150–400)
RBC: 4.09 MIL/uL (ref 3.87–5.11)
RDW: 21.7 % — ABNORMAL HIGH (ref 11.5–15.5)
WBC: 6.5 10*3/uL (ref 4.0–10.5)
nRBC: 0 % (ref 0.0–0.2)

## 2022-05-31 LAB — URINALYSIS, ROUTINE W REFLEX MICROSCOPIC
Bilirubin Urine: NEGATIVE
Glucose, UA: NEGATIVE mg/dL
Hgb urine dipstick: NEGATIVE
Ketones, ur: NEGATIVE mg/dL
Leukocytes,Ua: NEGATIVE
Nitrite: NEGATIVE
Protein, ur: NEGATIVE mg/dL
Specific Gravity, Urine: 1.027 (ref 1.005–1.030)
pH: 8 (ref 5.0–8.0)

## 2022-05-31 LAB — I-STAT BETA HCG BLOOD, ED (MC, WL, AP ONLY): I-stat hCG, quantitative: <5 m[IU]/mL (ref <5)

## 2022-05-31 LAB — PROTIME-INR
INR: 1.1 (ref 0.8–1.2)
Prothrombin Time: 13.7 seconds (ref 11.4–15.2)

## 2022-05-31 LAB — CBG MONITORING, ED: Glucose-Capillary: 151 mg/dL — ABNORMAL HIGH (ref 70–99)

## 2022-05-31 LAB — APTT: aPTT: 26 seconds (ref 24–36)

## 2022-05-31 MED ORDER — ROSUVASTATIN CALCIUM 20 MG PO TABS
40.0000 mg | ORAL_TABLET | Freq: Every day | ORAL | Status: DC
  Administered 2022-06-01 – 2022-06-04 (×4): 40 mg via ORAL
  Filled 2022-05-31 (×4): qty 2

## 2022-05-31 MED ORDER — HYDRALAZINE HCL 20 MG/ML IJ SOLN
20.0000 mg | Freq: Once | INTRAMUSCULAR | Status: AC
  Administered 2022-05-31: 20 mg via INTRAVENOUS
  Filled 2022-05-31: qty 1

## 2022-05-31 MED ORDER — ASPIRIN 81 MG PO TBEC
81.0000 mg | DELAYED_RELEASE_TABLET | Freq: Every day | ORAL | Status: DC
  Administered 2022-06-01 – 2022-06-04 (×4): 81 mg via ORAL
  Filled 2022-05-31 (×4): qty 1

## 2022-05-31 MED ORDER — LACTATED RINGERS IV BOLUS
1000.0000 mL | Freq: Once | INTRAVENOUS | Status: AC
  Administered 2022-05-31: 1000 mL via INTRAVENOUS

## 2022-05-31 MED ORDER — CLOPIDOGREL BISULFATE 75 MG PO TABS
75.0000 mg | ORAL_TABLET | Freq: Every day | ORAL | Status: DC
  Administered 2022-06-01 – 2022-06-04 (×4): 75 mg via ORAL
  Filled 2022-05-31 (×4): qty 1

## 2022-05-31 MED ORDER — IOHEXOL 350 MG/ML SOLN
100.0000 mL | Freq: Once | INTRAVENOUS | Status: AC | PRN
  Administered 2022-05-31: 100 mL via INTRAVENOUS

## 2022-05-31 MED ORDER — NALOXONE HCL 0.4 MG/ML IJ SOLN
0.4000 mg | Freq: Once | INTRAMUSCULAR | Status: AC
  Administered 2022-05-31: 0.4 mg via INTRAVENOUS
  Filled 2022-05-31: qty 1

## 2022-05-31 MED ORDER — HYDRALAZINE HCL 20 MG/ML IJ SOLN
10.0000 mg | Freq: Once | INTRAMUSCULAR | Status: AC
  Administered 2022-05-31: 10 mg via INTRAVENOUS
  Filled 2022-05-31: qty 1

## 2022-05-31 MED ORDER — HYDRALAZINE HCL 10 MG PO TABS: 10.0000 mg | ORAL_TABLET | Freq: Three times a day (TID) | ORAL | Status: DC

## 2022-05-31 MED ORDER — LOSARTAN POTASSIUM 50 MG PO TABS
100.0000 mg | ORAL_TABLET | Freq: Every day | ORAL | Status: DC
  Administered 2022-05-31 – 2022-06-04 (×5): 100 mg via ORAL
  Filled 2022-05-31 (×5): qty 2

## 2022-05-31 MED ORDER — AMLODIPINE BESYLATE 10 MG PO TABS
10.0000 mg | ORAL_TABLET | Freq: Every day | ORAL | Status: DC
  Administered 2022-05-31 – 2022-06-04 (×5): 10 mg via ORAL
  Filled 2022-05-31: qty 2
  Filled 2022-05-31: qty 1
  Filled 2022-05-31: qty 2
  Filled 2022-05-31 (×2): qty 1

## 2022-05-31 MED ORDER — CLEVIDIPINE BUTYRATE 0.5 MG/ML IV EMUL
0.0000 mg/h | INTRAVENOUS | Status: AC
  Administered 2022-05-31: 1 mg/h via INTRAVENOUS
  Administered 2022-05-31: 6 mg/h via INTRAVENOUS
  Filled 2022-05-31 (×2): qty 50

## 2022-05-31 NOTE — Code Documentation (Signed)
Stroke Response Nurse Documentation Code Documentation  Kaitlyn Gray is a 46 y.o. female admitted to [facility]  on 05-31-22 for difficulty walking with past medical hx of Stroke, hypertension, polysubstance abuse, Bipolar. On aspirin 81 mg daily and clopidogrel 75 mg daily. Code stroke was activated by ED MD.   Patient LKW in last 24 hours, unknown exact time. Last seen yesterday evening with friends. Patient woke up this AM "unable to use her legs." Per EMS, pt was altered though no focal deficits. In ED, pt noted to have left gaze and right weakness.   Stroke team at the bedside after patient activation. Patient to CT with team. NIHSS 9, see documentation for details and code stroke times. Patient with decreased LOC, disoriented, not following commands, left gaze preference , left facial droop, and left decreased sensation on exam.   The following imaging was completed:  CT Head, CTA, and CTP. Patient is not a candidate for IV Thrombolytic due to out of window. Patient is not a candidate for IR due to stroke not suspected.   Care/Plan: mNIH and VS q2h; SBP <160. Order to start clevidipine gtt.   Bedside handoff with RN Kaitlyn Gray.    Kaitlyn Gray  Stroke Response RN

## 2022-05-31 NOTE — Progress Notes (Signed)
RT responded to patient's room due to patient being brought in as unresponsive.  Upon arrival to room, patient was on ETCO2 cannule with ETCO2 reading at 33.  Patient also on room air with sats of 100%.  No respiratory distress noted.  Patient responded to painful stimuli.  MD present in room and consulting neurology.  No further RT interventions at this time.  Will continue to monitor.

## 2022-05-31 NOTE — ED Provider Notes (Signed)
[place] EMERGENCY DEPARTMENT Provider Note   CSN: [id number] Arrival date & time: 05/31/22  7169     History {Add pertinent medical, surgical, social history, OB history to HPI:1} Chief Complaint  Patient presents with   Altered Mental Status    Kaitlyn Gray is a 46 y.o. female.  HPI     Home Medications Prior to Admission medications   Medication Sig Start Date End Date Taking? Authorizing Provider  amLODipine (NORVASC) 10 MG tablet Take 1 tablet (10 mg total) by mouth daily. 05/07/22   Elgergawy, Silver Huguenin, MD  aspirin EC 81 MG tablet Take 1 tablet (81 mg total) by mouth daily. Swallow whole. 05/06/22   Elgergawy, Silver Huguenin, MD  blood glucose meter kit and supplies Rel[employer] Prime or Dispense other brand based on patient and insurance preference. Use up to four times daily as directed. (FOR ICD-9 250.00, 250.01). 12/07/17   Roxan Hockey, MD  clopidogrel (PLAVIX) 75 MG tablet Take 1 tablet (75 mg total) by mouth daily. 05/06/22   Elgergawy, Silver Huguenin, MD  ferrous sulfate 325 (65 FE) MG tablet Take 1 tablet (325 mg total) by mouth every other day. 05/06/22   Elgergawy, Silver Huguenin, MD  gabapentin (NEURONTIN) 400 MG capsule Take 1 capsule (400 mg total) by mouth 2 (two) times daily. Patient not taking: Reported on 05/02/2022 09/13/20   Nita Sells, MD  glipiZIDE (GLUCOTROL) 5 MG tablet Take 0.5 tablets (2.5 mg total) by mouth daily before breakfast. 05/07/22   Elgergawy, Silver Huguenin, MD  Insulin Pen Needle 31G X 5 MM MISC Use as directed 12/07/17   Roxan Hockey, MD  losartan (COZAAR) 100 MG tablet Take 1 tablet (100 mg total) by mouth daily. 05/07/22   Elgergawy, Silver Huguenin, MD  metFORMIN (GLUCOPHAGE-XR) 500 MG 24 hr tablet Take 1 tablet (500 mg total) by mouth 2 (two) times daily. 05/06/22   Elgergawy, Silver Huguenin, MD  pantoprazole (PROTONIX) 40 MG tablet Take 1 tablet (40 mg total) by mouth daily. 05/07/22   Elgergawy, Silver Huguenin, MD  QUEtiapine (SEROQUEL)  200 MG tablet Take 200 mg by mouth at bedtime. Patient not taking: Reported on 05/02/2022 08/31/20   [provider]  rosuvastatin (CRESTOR) 40 MG tablet Take 1 tablet (40 mg total) by mouth daily. 05/06/22   Elgergawy, Silver Huguenin, MD      Allergies    Hydrocodone and Latex    Review of Systems   Review of Systems  Physical Exam Updated Vital Signs Pulse 67   Resp 20   SpO2 100%  Physical Exam  ED Results / Procedures / Treatments   Labs (all labs ordered are listed, but only abnormal results are displayed) Labs Reviewed  CBG MONITORING, ED - Abnormal; Notable for the following components:      Result Value   Glucose-Capillary 151 (*)    All other components within normal limits    EKG None  Radiology No results found.  Procedures Procedures  {Document cardiac monitor, telemetry assessment procedure when appropriate:1}  Medications Ordered in ED Medications - No data to display  ED Course/ Medical Decision Making/ A&P                           Medical Decision Making Amount and/or Complexity of Data Reviewed Labs: ordered. Radiology: ordered.   ***  {Document critical care time when appropriate:1} {Document review of labs and clinical decision tools ie heart score, Chads2Vasc2 etc:1}  {  Document your independent review of radiology images, and any outside records:1} {Document your discussion with family members, caretakers, and with consultants:1} {Document social determinants of health affecting pt's care:1} {Document your decision making why or why not admission, treatments were needed:1} Final Clinical Impression(s) / ED Diagnoses Final diagnoses:  None    Rx / DC Orders ED Discharge Orders     None

## 2022-05-31 NOTE — H&P (Signed)
History and Physical    FRANCI OSHANA EUM:[id number] DOB: 1975-09-25 DOA: 05/31/2022  PCP: Patient, No Pcp Per   I have briefly reviewed patients previous medical reports in [facility].  Patient coming from: Home  Chief Complaint: Bilateral leg weakness and gait instability.  HPI: Kaitlyn Gray is a 46 year old female, lives alone, ambulates with the help of a walker, extensive past medical history including not limited to strokes with residual dysarthria, facial asymmetry and left-sided weakness, hypertension, diabetes, polysubstance abuse (severe cocaine use, cannabis, tobacco & opiate), adjustment disorder, bipolar disorder, chronic anemia, presented to the ED via EMS on 05/31/2022 due to acute onset of leg weakness and inability to walk.  She says that she called 911 at that time and on their arrival, she was altered but without focal deficits.  EMS reported BP of 200/110.  In the ED, she was not following commands, had fluctuating neurological symptoms, and at 1 point noted to have persistent left gaze deviation and right-sided weakness.  Code stroke was activated.  She was seen by neurology.  Initial exam revealed decreased LOC, disorientation, not following commands, left gaze preference, left facial droop and decreased sensation with NIHSS of 9.  After evaluation, neurology felt that she likely had hypertensive encephalopathy than stroke.  PCCM was consulted due to hypertensive emergency, briefly started on Cleviprex drip.  With these measures, her blood pressures improved and so did her mental status.  Drip was discontinued approximately 2 hours ago.  PCCM recommended admitting to progressive care unit.  Patient noted to be tolerating diet well in ED.  Patient confirms that she last did cocaine on 05/28/2022.  ED Course: As noted above.  Most recent BP 138/82.  Lab work significant for glucose of 153, hemoglobin of 9.7, MCV of 79.2.  UA not suggestive of UTI.  Blood alcohol  level <10.  UDS positive for cocaine.  Pregnancy test negative.  CT head negative for acute stroke.  Shows old strokes.  CTA head without large vessel occlusion.  CT perfusion study negative.  Review of Systems:  All other systems reviewed and apart from HPI, are negative.  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    Encounter for assessment of healthcare decision-making capacity    History of cervical fracture 12/24/2017   nondisplaced fracture lateral mass of C1 on the right on CT 12/24/17   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 ([place]) 07/19/2017   Hypertension    Ischemic stroke ([place]) 01/01/2020   subacute right middle cerebellar peduncle and pons infarction   Left-sided weakness 01/27/2022   Head CT with remote right occipital infarct which is consistent with old left-sided weakness   MDD (major depressive disorder), recurrent severe, without psychosis ([place]) 03/24/2015   Normocytic anemia 02/09/2020   Opiate use    History of Suboxone Therapy until 05/2021   Polysubstance abuse ([place]) 07/19/2017   Prescription drug misadventures ([place])     Past Surgical History:  Procedure Laterality Date   CESAREAN SECTION      Social History  reports that she has been smoking cigarettes. She has been smoking an average of .5 packs per day. She has never used smokeless tobacco. She reports current drug  use. Drugs: Marijuana and Cocaine. She reports that she does not drink alcohol.  Allergies  Allergen Reactions   Hydrocodone Itching   Latex Itching and Rash    Family History  Problem Relation Age of Onset   Hypertension Mother    CAD Mother 76       died of MI at age 68   Hypertension Father       Prior to Admission medications   Medication Sig Start Date End Date Taking? Authorizing Provider  amLODipine (NORVASC) 10 MG tablet Take 1 tablet (10 mg total) by mouth daily. 05/07/22   Elgergawy, Silver Huguenin, MD  aspirin EC 81 MG tablet Take 1 tablet (81 mg total) by mouth daily. Swallow whole. 05/06/22   Elgergawy, Silver Huguenin, MD  blood glucose meter kit and supplies Relion Prime or Dispense other brand based on patient and insurance preference. Use up to four times daily as directed. (FOR ICD-9 250.00, 250.01). 12/07/17   Roxan Hockey, MD  clopidogrel (PLAVIX) 75 MG tablet Take 1 tablet (75 mg total) by mouth daily. 05/06/22   Elgergawy, Silver Huguenin, MD  ferrous sulfate 325 (65 FE) MG tablet Take 1 tablet (325 mg total) by mouth every other day. 05/06/22   Elgergawy, Silver Huguenin, MD  gabapentin (NEURONTIN) 400 MG capsule Take 1 capsule (400 mg total) by mouth 2 (two) times daily. Patient not taking: Reported on 05/02/2022 09/13/20   Nita Sells, MD  glipiZIDE (GLUCOTROL) 5 MG tablet Take 0.5 tablets (2.5 mg total) by mouth daily before breakfast. 05/07/22   Elgergawy, Silver Huguenin, MD  Insulin Pen Needle 31G X 5 MM MISC Use as directed 12/07/17   Roxan Hockey, MD  losartan (COZAAR) 100 MG tablet Take 1 tablet (100 mg total) by mouth daily. 05/07/22   Elgergawy, Silver Huguenin, MD  metFORMIN (GLUCOPHAGE-XR) 500 MG 24 hr tablet Take 1 tablet (500 mg total) by mouth 2 (two) times daily. 05/06/22   Elgergawy, Silver Huguenin, MD  pantoprazole (PROTONIX) 40 MG tablet Take 1 tablet (40 mg total) by mouth daily. 05/07/22   Elgergawy, Silver Huguenin, MD  QUEtiapine (SEROQUEL) 200 MG tablet Take 200 mg by mouth at bedtime. Patient not taking: Reported on 05/02/2022 08/31/20   [provider]  rosuvastatin (CRESTOR) 40 MG tablet Take 1 tablet (40 mg total) by mouth daily. 05/06/22   Elgergawy, Silver Huguenin, MD    Physical Exam: Vitals:   05/31/22 1545 05/31/22 1600 05/31/22 1623 05/31/22 1630  BP: (!) 163/83  (!) 151/77  138/82  Pulse: 81   (!) 105  Resp: (!) 21 (!) 22  (!) 31  Temp:   98.2 F (36.8 C)   TempSrc:   Oral   SpO2: 100%   96%      Constitutional: Young female, moderately built and nourished lying comfortably propped up in bed without distress. Eyes: PERTLA, lids and conjunctivae normal.  Not fully cooperative with EOM checking.  Still appears that she is unable to extend her gaze beyond the midline to the right.  No nystagmus. ENMT: Mucous membranes are moist. Posterior pharynx clear of any exudate or lesions. Normal dentition.  Neck: supple, no masses, no thyromegaly Respiratory: Clear to auscultation without wheezing, rhonchi or crackles. No increased work of breathing. Cardiovascular: S1 & S2 heard, regular rate and rhythm. No JVD, murmurs, rubs or clicks. No pedal edema. Abdomen: Non distended. Non tender. Soft. No organomegaly or masses appreciated. No clinical Ascites. Normal bowel sounds heard. Musculoskeletal: no clubbing / cyanosis. No  joint deformity upper and lower extremities.  Right limbs at least grade 4 x 5 power.  Left upper extremity grade 2 x 5 power in left lower extremity grade 1 x 5 power. Skin: no rashes, lesions, ulcers. No induration Neurologic: Pupils equally reacting to light and accommodation.  Dysarthria, possibly chronic.  Diminished right nasolabial fold. Psychiatric: Normal judgment and insight. Alert and oriented x 3. Normal mood.     Labs on Admission: I have personally reviewed following labs and imaging studies  CBC: Recent Labs  Lab 05/31/22 0900 05/31/22 0920  WBC 6.5  --   NEUTROABS 5.1  --   HGB 9.7* 11.2*  HCT 32.4* 33.0*  MCV 79.2*  --   PLT 256  --     Basic Metabolic Panel: Recent Labs  Lab 05/31/22 0900 05/31/22 0920  NA 137 138  K 4.3 4.2  CL 105 106  CO2 23  --   GLUCOSE 153* 154*  BUN 13 15  CREATININE 0.76 0.70  CALCIUM 8.9  --     Liver Function Tests: Recent Labs  Lab 05/31/22 0900  AST 20  ALT 11   ALKPHOS 63  BILITOT 0.3  PROT 7.1  ALBUMIN 3.7    Urine analysis:    Component Value Date/Time   COLORURINE COLORLESS (A) 05/31/2022 0919   APPEARANCEUR CLEAR 05/31/2022 0919   LABSPEC 1.027 05/31/2022 0919   PHURINE 8.0 05/31/2022 0919   GLUCOSEU NEGATIVE 05/31/2022 0919   HGBUR NEGATIVE 05/31/2022 0919   BILIRUBINUR NEGATIVE 05/31/2022 0919   KETONESUR NEGATIVE 05/31/2022 0919   PROTEINUR NEGATIVE 05/31/2022 0919   UROBILINOGEN 0.2 10/06/2014 0856   NITRITE NEGATIVE 05/31/2022 0919   LEUKOCYTESUR NEGATIVE 05/31/2022 0919     Radiological Exams on Admission: CT ANGIO HEAD NECK W WO CM W PERF (CODE STROKE)  Result Date: 05/31/2022 CLINICAL DATA:  46 year old female code stroke EXAM: CT ANGIOGRAPHY HEAD AND NECK CT PERFUSION BRAIN TECHNIQUE: Multidetector CT imaging of the head and neck was performed using the standard protocol during bolus administration of intravenous contrast. Multiplanar CT image reconstructions and MIPs were obtained to evaluate the vascular anatomy. Carotid stenosis measurements (when applicable) are obtained utilizing NASCET criteria, using the distal internal carotid diameter as the denominator. Multiphase CT imaging of the brain was performed following IV bolus contrast injection. Subsequent parametric perfusion maps were calculated using RAPID software. RADIATION DOSE REDUCTION: This exam was performed according to the departmental dose-optimization program which includes automated exposure control, adjustment of the mA and/or kV according to patient size and/or use of iterative reconstruction technique. CONTRAST:  153m OMNIPAQUE IOHEXOL 350 MG/ML SOLN COMPARISON:  Plain head CT 0929 hours today. Brain MRI 05/01/2022 and earlier, including CTA head and neck 03/30/2022. FINDINGS: CT Brain Perfusion Findings: ASPECTS: 10 CBF (<30%) Volume: 072mPerfusion (Tmax>6.0s) volume: 51m22mismatch Volume: Not applicable Infarction Location:Not applicable CTA NECK Skeleton:  Stable visualized osseous structures. Upper chest: Stable, negative. Other neck: No acute neck soft tissue finding. Aortic arch: Stable 3 vessel arch configuration. Mild arch tortuosity, no arch atherosclerosis. Right carotid system: Stable including retropharyngeal course and segmental stenosis at the right ICA bulb, numerically estimated at 70% over a 3 mm segment. See series 9, image 24. Tortuosity distal to that stenosis again noted. Left carotid system: Stable tortuosity and retropharyngeal course without stenosis. Vertebral arteries: Proximal right subclavian artery and right vertebral artery origin remain within normal limits. Right vertebral artery remains patent to the skull base with no significant plaque or stenosis. Proximal  left subclavian artery and left vertebral artery origin remain negative. Non dominant left vertebral artery with tortuous V1 segment. Left vertebral remains patent to the skull base with no significant plaque or stenosis identified. CTA HEAD Posterior circulation: Patent distal vertebral arteries and vertebrobasilar junction with mild irregularity and stenosis. PICA origins appear patent. Patent basilar artery with mild irregularity, no significant basilar stenosis. Patent SCA and PCA origins. Posterior communicating arteries are diminutive or absent. Left P1 and P2 segments remain patent without significant stenosis. Up to moderate multifocal P3 segment irregularity now on series 11, image 24. Stable chronic irregularity and stenosis of the right P2 segment with attenuated right PCA branches. Anterior circulation: Both ICA siphons remain patent. On the right there is mild to moderate cavernous segment plaque with no significant stenosis. But on the left there is moderate stenosis at the distal cavernous segment on series 10, image 116 which appears atherosclerotic and stable since October. No supraclinoid segment stenosis and both carotid termini remain patent. Patent MCA and ACA  origins. Mild A1 segment irregularity is stable. Normal anterior communicating artery. A2 segment irregularity, stenosis, and occlusion. And the more dominant appearing distal right ACA is also occluded in the A3 segment with some reconstitution (series 11, image 21). This appearance is mildly progressed since October. Left MCA M1 segment and bifurcation are patent without stenosis. Left MCA branches are mildly irregular, most pronounced in posterior left M3 branches on series 11, image 28 and not significantly changed. Right MCA M1 segment is mildly to moderately irregular and stenotic on series 9, image 25 which is stable. Right MCA bifurcation remains patent. Right MCA branches appear stable within irregular and truncated M3 branch appearance somewhat diffusely (series 11, image 14). Venous sinuses: Early contrast timing, not well evaluated. Anatomic variants: Mildly dominant right vertebral artery. Affectively dominant right ACA. Review of the MIP images confirms the above findings IMPRESSION: 1. Negative for emergent large vessel occlusion, and negative CT Perfusion. 2. 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 - Chronically attenuated Right PCA. - Moderate stenosis Left ICA cavernous segment. - Moderate stenosis Right MCA M1 - Moderate bilateral MCA M3 and Left PCA P3 branch stenoses. Salient findings were communicated to Dr. Cheral Marker at 10:13 am on 05/31/2022 by text page via the [facility] messaging system. Electronically Signed   By: Genevie Ann M.D.   On: 05/31/2022 10:14   CT HEAD CODE STROKE WO CONTRAST  Result Date: 05/31/2022 CLINICAL DATA:  Code stroke. 46 year old female. History of previous cerebral infarcts, including left perirolandic infarction in October this year. EXAM: CT HEAD WITHOUT CONTRAST TECHNIQUE: Contiguous axial images were obtained from the  base of the skull through the vertex without intravenous contrast. RADIATION DOSE REDUCTION: This exam was performed according to the departmental dose-optimization program which includes automated exposure control, adjustment of the mA and/or kV according to patient size and/or use of iterative reconstruction technique. COMPARISON:  Brain MRI 05/01/2022 and earlier. FINDINGS: Brain: Superior left perirolandic developing encephalomalacia appears stable from last month. Contralateral small chronic right centrum semiovale infarcts better demonstrated by MRI. Chronic right occipital pole encephalomalacia. Chronic right cerebellar encephalomalacia appears progressed by CT since October (series 5, image 43) although seems not significantly changed from previous MRIs and associated with some chronic brainstem and cerebellar peduncle Wallerian degeneration there. Stable cerebral volume. No midline shift, ventriculomegaly, mass effect, evidence of mass  lesion, intracranial hemorrhage or evidence of cortically based acute infarction. Vascular: Mild Calcified atherosclerosis at the skull base. No suspicious intracranial vascular hyperdensity. Skull: Chronic right lamina papyracea fracture. No acute osseous abnormality identified. Sinuses/Orbits: Chronic right lamina papyracea fracture and scattered generally mild sinus disease is stable. Tympanic cavities and mastoids remain aerated. Other: Similar leftward gaze. No acute orbit or scalp soft tissue finding. ASPECTS [facility] Stroke Program Early CT Score) Total score (0-10 with 10 being normal): 10 IMPRESSION: 1. No acute intracranial hemorrhage or cortically based infarction identified. ASPECTS 10. 2. Chronic ischemia, most pronounced in the posterior left MCA and right PCA territories. Right cerebellar encephalomalacia/Wallerian degeneration more apparent by CT but seems stable from previous MRIs. 3. These results were communicated to Dr. Cheral Marker at 9:38 am on 05/31/2022 by  text page via the [facility] messaging system. Electronically Signed   By: Genevie Ann M.D.   On: 05/31/2022 09:39    EKG: Independently reviewed.  Sinus rhythm at 64 bpm, no acute findings and QTc of 456 ms.  Assessment/Plan Principal Problem:   Hypertensive emergency Active Problems:   Polysubstance abuse (HCC)   Type 2 diabetes mellitus ([place])   Hypertension associated with diabetes ([place])   Cocaine use disorder, severe, dependence ([place])   History of stroke   Hemiparesis affecting left side as late effect of cerebrovascular accident (CVA) ([place])     Hypertensive emergency: Initial BP 200/110 by EMS with strokelike symptoms and altered mental status.  Suspect multifactorial due to recent cocaine abuse and noncompliance with meds.  Briefly on Cleviprex drip which has been discontinued.  Resumed prior home meds including amlodipine 10 Mg daily, Cozaar 100 Mg daily, as needed IV hydralazine for SBP >160.  Avoid beta-blockers due to cocaine use.  Monitor closely.  Acute toxic encephalopathy/hypertensive encephalopathy: Secondary to cocaine abuse.  Imaging negative for acute stroke.  Mental status already improving.  Continue to monitor.  History of CVA with residual left hemiparesis: Neurology consultation appreciated.  Do not feel that she has new stroke.  Imaging findings as noted above.  Continue aspirin 81 Mg daily, Plavix 75 Mg daily and statins.  Tolerating diet.  PT and OT evaluation.  Type II DM with hyperglycemia: A1c 6.6 in October 2023.  Hold oral meds.  SSI and adjust as needed.  Essential hypertension: Management as noted above.  Polysubstance abuse (cocaine, cannabis, tobacco and opiates): Volunteers to last using cocaine on 12/13.  Cessation counseled.  Warned her that continued substance abuse puts her at grave danger for recurrent complications and even death.  Bipolar disorder: Appears to be not on any meds at home.  Awaiting pharmacy to complete home med  review.  GERD: Continue PPI  Chronic microcytic anemia: Stable.   DVT prophylaxis: Lovenox Code Status: Full, confirmed with patient in ED. Family Communication: None at bedside. Disposition Plan:   Patient is from:  Home  Anticipated DC to:  Home  Anticipated DC date:  06/01/2022  Anticipated DC barriers: None   Consults called: Neurology and PCCM have already seen Admission status: Observation, progressive bed  Severity of Illness: The appropriate patient status for this patient is OBSERVATION. Observation status is judged to be reasonable and necessary in order to provide the required intensity of service to ensure the patient's safety. The patient's presenting symptoms, physical exam findings, and initial radiographic and laboratory data in the context of their medical condition is felt to place them at decreased risk for further clinical deterioration. Furthermore, it is anticipated that  the patient will be medically stable for discharge from the hospital within 2 midnights of admission.     Vernell Leep MD FACP, [place], [place], [facility], [facility]   [facility] [place]    To contact the attending provider between 7A-7P or the covering provider during after hours 7P-7A, please log into the web site [email or web address] and access using universal [place] password for that web site. If you do not have the password, please call the hospital operator.  05/31/2022, 6:06 PM

## 2022-05-31 NOTE — ED Provider Notes (Signed)
  Physical Exam  BP 138/82   Pulse (!) 105   Temp 98.2 F (36.8 C) (Oral)   Resp (!) 31   SpO2 96%   Physical Exam  Procedures  Procedures  ED Course / MDM    Medical Decision Making Care assumed at 3 PM.  Patient is aphasic from previous stroke.  Patient came here with acute altered mental status.  Patient's blood pressure was 220 at that time.  Neurology saw patient as a code stroke.  CTA and CT perfusion were unremarkable.  She was thought to have hypertensive encephalopathy.  Patient was started on Cleviprex drip.  ICU saw patient and started patient on IV hydralazine as well as given home BP meds and recommend titrating off of the drip.  3:15 pm Patient blood pressure is down to the 150s.  I told the nurse to stop the drip for now.  5:38 PM Patient has been off the drip for about 2 hours now.  Blood pressure is now in the 130s.  At this point hospitalist to admit for hypertensive encephalopathy.  Patient is back to baseline now   CRITICAL CARE Performed by: Richardean Canal   Total critical care time: 30 minutes  Critical care time was exclusive of separately billable procedures and treating other patients.  Critical care was necessary to treat or prevent imminent or life-threatening deterioration.  Critical care was time spent personally by me on the following activities: development of treatment plan with patient and/or surrogate as well as nursing, discussions with consultants, evaluation of patient's response to treatment, examination of patient, obtaining history from patient or surrogate, ordering and performing treatments and interventions, ordering and review of laboratory studies, ordering and review of radiographic studies, pulse oximetry and re-evaluation of patient's condition.   Problems Addressed: Hypertensive encephalopathy: acute illness or injury  Amount and/or Complexity of Data Reviewed Labs: ordered. Decision-making details documented in ED  Course. Radiology: ordered and independent interpretation performed. Decision-making details documented in ED Course. ECG/medicine tests: ordered and independent interpretation performed. Decision-making details documented in ED Course.  Risk Prescription drug management. Decision regarding hospitalization.          Charlynne Pander, MD 05/31/22 1739

## 2022-05-31 NOTE — ED Notes (Signed)
Pt is refusing to participate in neuro exams. Speaks in full sentences to tell this RN that she wants to take off her BP cuff and that she would like additional beverages; however, she shuts her eyes and states "no" to neuro questions. Moving all 4 ext, tracks RN movements to L and R.

## 2022-05-31 NOTE — Consult Note (Addendum)
NAME:  Kaitlyn Gray, MRN:  [id number], DOB:  February 14, 1976, LOS: 0 ADMISSION DATE:  05/31/2022, CONSULTATION DATE:  12/16 REFERRING MD:  Dr. Billy Fischer, CHIEF COMPLAINT: Acute Inability to Walk   History of Present Illness:  46 y/o F who presented to [facility] ER on 12/16 with an acute inability to walk, AMS.   The patient presented with an acute inability to use her legs this am.  She was altered on EMS arrival but without focal deficits.  Her initial BP with EMS was 200/100, HR 60, room air sats 100%.  In ER she was not following commands, and had alternating neurologic symptoms on both sides.  She was felt to have a persistent left gaze and right sided wekaness resulting in a CODE STROKE.  LKN in last 24 hours, but exact time unknown. CT head evaluated and negative for LVO. She was started on cleviprex for hypertension.  UDS positive for cocaine. Initial labs - Na 138, K 4.2, Cl105, glucose 153, BUN 13, Sr Cr 0.76, WBC 6.5, Hgb 9.7, and platelets 256.   PCCM called for consultation.   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  Sneezing frequently.  Afebrile  IV team working on IV access   Objective   Blood pressure (!) 146/103, pulse 76, temperature 97.9 F (36.6 C), temperature source Oral, resp. rate 18, SpO2 100 %.       No intake or output data in the 24 hours ending 05/31/22 1346 There were no vitals filed for this visit.  Examination: General: adult female sitting up in bed, chronically ill  appearing  HENT: mild dysarthria at times, MM pink/moist, clear nasal drainage, productive sneeze, pupils =/reactive  Lungs: non-labored at rest, lungs bilaterally diminished but clear  Cardiovascular: s1s2 RRR, no m/r/g Abdomen: soft/non-tender, bsx4 active  Extremities: warm/dry, no edema  Neuro: Awake, alert, follows commands on right, does not follow commands on left / withdraws from pain, states "that's the weak side", does move spontaneously intermittently     Resolved Hospital Problem list     Assessment & Plan:   Cocaine Abuse with Hypertensive Emergency  Acute Toxic Encephalopathy CT head negative for LVO on presentation.  Imaging shows prior remote right occipital infarct, more recent late subacute / early chronic high left parietal lobe infarcts seen on MRI 03/30/22. UDS positive for cocaine, ETOH negative.  -now hydralazine 39m IV x1 -add oral agents > norvasc, cozaar are home meds -wean cleviprex to off by 4pm  -CT head negative for LVO  -no other end organ damage, mental status improving  -SBP goal <160 -continue ASA, plavix, rosuvastatin for secondary stroke prevention -if no improvement in mental status, consider MRI brain  -follow up BP trend this PM, anticipate he will be off cleviprex in the next hour to two -substance abuse counseling   Best Practice (right click and "Reselect all SmartList Selections" daily)  Pending admit  Labs   CBC: Recent Labs  Lab 05/31/22 0900 05/31/22 0920  WBC 6.5  --   NEUTROABS  5.1  --   HGB 9.7* 11.2*  HCT 32.4* 33.0*  MCV 79.2*  --   PLT 256  --     Basic Metabolic Panel: Recent Labs  Lab 05/31/22 0900 05/31/22 0920  NA 137 138  K 4.3 4.2  CL 105 106  CO2 23  --   GLUCOSE 153* 154*  BUN 13 15  CREATININE 0.76 0.70  CALCIUM 8.9  --    GFR: CrCl cannot be calculated (Unknown ideal weight.). Recent Labs  Lab 05/31/22 0900  WBC 6.5    Liver Function Tests: Recent Labs  Lab 05/31/22 0900  AST 20  ALT  11  ALKPHOS 63  BILITOT 0.3  PROT 7.1  ALBUMIN 3.7   No results for input(s): "LIPASE", "AMYLASE" in the last 168 hours. No results for input(s): "AMMONIA" in the last 168 hours.  ABG    Component Value Date/Time   HCO3 27.1 11/06/2017 0305   TCO2 22 05/31/2022 0920   O2SAT 68.0 11/06/2017 0305     Coagulation Profile: Recent Labs  Lab 05/31/22 0900  INR 1.1    Cardiac Enzymes: No results for input(s): "CKTOTAL", "CKMB", "CKMBINDEX", "TROPONINI" in the last 168 hours.  HbA1C: Hgb A1c MFr Bld  Date/Time Value Ref Range Status  03/30/2022 10:34 PM 6.6 (H) 4.8 - 5.6 % Final    Comment:    (NOTE) Pre diabetes:          5.7%-6.4%  Diabetes:              >6.4%  Glycemic control for   <7.0% adults with diabetes   09/07/2020 03:52 AM 7.3 (H) 4.8 - 5.6 % Final    Comment:    (NOTE) Pre diabetes:          5.7%-6.4%  Diabetes:              >6.4%  Glycemic control for   <7.0% adults with diabetes     CBG: Recent Labs  Lab 05/31/22 0912  GLUCAP 151*    Review of Systems:   Gen: Denies fever, chills, weight change, fatigue, night sweats HEENT: Denies blurred vision, double vision, hearing loss, tinnitus, sinus congestion, rhinorrhea, sore throat, neck stiffness, dysphagia PULM: Denies shortness of breath, cough, sputum production, hemoptysis, wheezing CV: Denies chest pain, edema, orthopnea, paroxysmal nocturnal dyspnea, palpitations GI: Denies abdominal pain, nausea, vomiting, diarrhea, hematochezia, melena, constipation, change in bowel habits GU: Denies dysuria, hematuria, polyuria, oliguria, urethral discharge Endocrine: Denies hot or cold intolerance, polyuria, polyphagia or appetite change Derm: Denies rash, dry skin, scaling or peeling skin change Heme: Denies easy bruising, bleeding, bleeding gums Neuro: Denies headache, numbness, weakness, altered mental status, slurred speech, loss of memory or consciousness   Past Medical History:  She,  has a  past medical history of Acute encephalopathy (05/12/2018), Adjustment disorder with disturbance of conduct (02/12/2020), Bipolar 1 disorder ([place]), Bipolar disorder ([place]) (07/19/2017), Cannabis use disorder, moderate, dependence ([place]) (03/24/2015), Chronic anemia, Cocaine use disorder, severe, dependence ([place]) (03/24/2015), 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   Surgical History:   Past Surgical History:  Procedure Laterality Date   CESAREAN SECTION  Social History:   reports that she has been smoking cigarettes. She has been smoking an average of .5 packs per day. She has never used smokeless tobacco. She reports current drug use. Drugs: Marijuana and Cocaine. She reports that she does not drink alcohol.   Family History:  Her family history includes CAD (age of onset: 39) in her mother; Hypertension in her father and mother.   Allergies Allergies  Allergen Reactions   Hydrocodone Itching   Latex Itching and Rash     Home Medications  Prior to Admission medications   Medication Sig Start Date End Date Taking? Authorizing Provider  amLODipine (NORVASC) 10 MG tablet Take 1 tablet (10 mg total) by mouth daily. 05/07/22   Elgergawy, Silver Huguenin, MD  aspirin EC 81 MG tablet Take 1 tablet (81 mg total) by mouth daily. Swallow whole. 05/06/22   Elgergawy, Silver Huguenin, MD  blood glucose meter kit and supplies Relion Prime or Dispense other brand based on patient and insurance preference. Use up  to four times daily as directed. (FOR ICD-9 250.00, 250.01). 12/07/17   Roxan Hockey, MD  clopidogrel (PLAVIX) 75 MG tablet Take 1 tablet (75 mg total) by mouth daily. 05/06/22   Elgergawy, Silver Huguenin, MD  ferrous sulfate 325 (65 FE) MG tablet Take 1 tablet (325 mg total) by mouth every other day. 05/06/22   Elgergawy, Silver Huguenin, MD  gabapentin (NEURONTIN) 400 MG capsule Take 1 capsule (400 mg total) by mouth 2 (two) times daily. Patient not taking: Reported on 05/02/2022 09/13/20   Nita Sells, MD  glipiZIDE (GLUCOTROL) 5 MG tablet Take 0.5 tablets (2.5 mg total) by mouth daily before breakfast. 05/07/22   Elgergawy, Silver Huguenin, MD  Insulin Pen Needle 31G X 5 MM MISC Use as directed 12/07/17   Roxan Hockey, MD  losartan (COZAAR) 100 MG tablet Take 1 tablet (100 mg total) by mouth daily. 05/07/22   Elgergawy, Silver Huguenin, MD  metFORMIN (GLUCOPHAGE-XR) 500 MG 24 hr tablet Take 1 tablet (500 mg total) by mouth 2 (two) times daily. 05/06/22   Elgergawy, Silver Huguenin, MD  pantoprazole (PROTONIX) 40 MG tablet Take 1 tablet (40 mg total) by mouth daily. 05/07/22   Elgergawy, Silver Huguenin, MD  QUEtiapine (SEROQUEL) 200 MG tablet Take 200 mg by mouth at bedtime. Patient not taking: Reported on 05/02/2022 08/31/20   [provider]  rosuvastatin (CRESTOR) 40 MG tablet Take 1 tablet (40 mg total) by mouth daily. 05/06/22   Elgergawy, Silver Huguenin, MD     Critical care time: 35 minutes    Noe Gens, MSN, APRN, NP-C, AGACNP-BC [facility] 05/31/2022, 1:46 PM   Please see [email or web address] for pager details.   From 7A-7P if no response, please call [phone] After hours, please call ELink [phone]

## 2022-05-31 NOTE — ED Triage Notes (Signed)
Pt BIB [facility] form home. EMS reported that patient came them not making much sense. She had generalized weakness her GCS was 8.Pt have prior stroke, hx of drug use. EMS report BP 200/110, HR 60, RR 20 and saturation 100.

## 2022-05-31 NOTE — ED Notes (Signed)
Per Dr Silverio Lay Stop Cleviprex

## 2022-05-31 NOTE — Consult Note (Signed)
NEURO HOSPITALIST CONSULT NOTE   Requestig physician: Dr. Billy Fischer  Reason for Consult: Acute onset of inability to walk  History obtained from:  EDP and Chart     HPI:                                                                                                                                          Kaitlyn Gray is an 46 y.o. female with a PMHx of strokes (including right middle cerebellar penduncle and pons, as well as left parietal lobe; has residual speech deficit and left sided weakness), intracranial atherosclerotic disease, adjustment disorder with disturbance of conduct, bipolar disorder, polysubstance abuse, cannabis use disorder, severe cocaine use disorder with dependence, opiate use, chronic anemia, DM2, prior HONK and HTN who presents to the ED with new onset of inability to use her legs this morning. She called EMS at that time and on their arrival she was altered but without focal deficits. EMS report BP 200/110, HR 60, RR 20 and saturation 100. In the ED she was not following commands and had neurological symptoms on alternating sides, but at one point what was felt to be persistent left gaze deviation and right sided weakness was noted, resulting in decision to call a Code Stroke. LKN was sometime within the last 24 hours, but unknown exact time; she was last seen yesterday evening with friends. Of note, she was admitted to [facility]  on 05-31-22 for difficulty walking.   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    Encounter for assessment of healthcare decision-making capacity    History of cervical fracture 12/24/2017    nondisplaced fracture lateral mass of C1 on the right on CT 12/24/17   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 ([place]) 07/19/2017   Hypertension    Ischemic stroke ([place]) 01/01/2020   subacute right middle cerebellar peduncle and pons infarction   Left-sided weakness 01/27/2022   Head CT with remote right occipital infarct which is consistent with old left-sided weakness   MDD (major depressive disorder), recurrent severe, without psychosis ([place]) 03/24/2015   Normocytic anemia 02/09/2020   Opiate use    History of Suboxone Therapy until 05/2021   Polysubstance abuse ([place]) 07/19/2017   Prescription drug misadventures ([place])    Stroke [employer])     Past Surgical History:  Procedure Laterality Date   CESAREAN SECTION      Family History  Problem Relation  Age of Onset   Hypertension Mother    CAD Mother 34       died of MI at age 41   Hypertension Father               Social History:  reports that she has been smoking cigarettes. She has been smoking an average of .5 packs per day. She has never used smokeless tobacco. She reports current drug use. Drugs: Marijuana and Cocaine. She reports that she does not drink alcohol.  Allergies  Allergen Reactions   Hydrocodone Itching   Latex Itching and Rash    HOME MEDICATIONS:                                                                                                                      No current facility-administered medications on file prior to encounter.   Current Outpatient Medications on File Prior to Encounter  Medication Sig Dispense Refill   amLODipine (NORVASC) 10 MG tablet Take 1 tablet (10 mg total) by mouth daily. 30 tablet 1   aspirin EC 81 MG tablet Take 1 tablet (81 mg total) by mouth daily. Swallow whole. 30 tablet 1   blood glucose meter kit and supplies [employer] Prime or Dispense other brand based on patient and insurance preference. Use up to four times daily as directed. (FOR ICD-9  250.00, 250.01). 1 each 3   clopidogrel (PLAVIX) 75 MG tablet Take 1 tablet (75 mg total) by mouth daily. 30 tablet 1   ferrous sulfate 325 (65 FE) MG tablet Take 1 tablet (325 mg total) by mouth every other day. 30 tablet 1   gabapentin (NEURONTIN) 400 MG capsule Take 1 capsule (400 mg total) by mouth 2 (two) times daily. (Patient not taking: Reported on 05/02/2022) 12 capsule 0   glipiZIDE (GLUCOTROL) 5 MG tablet Take 0.5 tablets (2.5 mg total) by mouth daily before breakfast. 30 tablet 1   Insulin Pen Needle 31G X 5 MM MISC Use as directed 100 each 2   losartan (COZAAR) 100 MG tablet Take 1 tablet (100 mg total) by mouth daily. 30 tablet 1   metFORMIN (GLUCOPHAGE-XR) 500 MG 24 hr tablet Take 1 tablet (500 mg total) by mouth 2 (two) times daily. 60 tablet 1   pantoprazole (PROTONIX) 40 MG tablet Take 1 tablet (40 mg total) by mouth daily. 30 tablet 0   QUEtiapine (SEROQUEL) 200 MG tablet Take 200 mg by mouth at bedtime. (Patient not taking: Reported on 05/02/2022)     rosuvastatin (CRESTOR) 40 MG tablet Take 1 tablet (40 mg total) by mouth daily. 30 tablet 1     ROS:  States "I'm cold" and "I can't walk". Otherwise non-communicative regarding her ROS.   Blood pressure (!) 197/107, pulse 66, temperature (!) 97.2 F (36.2 C), temperature source Oral, resp. rate 18, SpO2 98 %.   General Examination:                                                                                                       Physical Exam  HEENT-  [place]/AT  Lungs- Respirations unlabored Extremities- No edema   Neurological Examination Mental Status: Awake with decreased level of alertness and sedated affect on arrival, but becomes agitated with thrashing movements after light sternal rub and noxious plantar stimulation. Limited speech output consists of short fluent phrases and  exclamations. Aggressive behavior noted. Does not answer any orientation questions. Does not follow any commands but does respond volitionally to noxious stimuli, as noted above. No hemineglect.  Cranial Nerves: II: Will glance at examiners briefly. Otherwise noncooperative III,IV, VI: Bilateral mild ptosis in the context of sedated affect V: Noncooperative VII: Face is symmetric while grimacing and yelling at staff VIII: Hearing intact to voice IX,X: Phonation intact XI: Head is near the midline and will rotate to left and right in response to stimuli XII: Not cooperative.  Motor/Sensory: Withdraws and exclaims in response to noxious stimuli applied to BUE and BLE. There is a trend towards decreased movement on the right compared to the left.  Deep Tendon Reflexes: Not cooperative; will not relax sufficiently for assessment Plantars: Right: downgoing   Left: downgoing Cerebellar: No gross ataxia. Uncooperative.  Gait: Unable to assess   Lab Results: Basic Metabolic Panel: Recent Labs  Lab 05/31/22 0900 05/31/22 0920  NA 137 138  K 4.3 4.2  CL 105 106  CO2 23  --   GLUCOSE 153* 154*  BUN 13 15  CREATININE 0.76 0.70  CALCIUM 8.9  --     CBC: Recent Labs  Lab 05/31/22 0900 05/31/22 0920  WBC 6.5  --   NEUTROABS 5.1  --   HGB 9.7* 11.2*  HCT 32.4* 33.0*  MCV 79.2*  --   PLT 256  --     Cardiac Enzymes: No results for input(s): "CKTOTAL", "CKMB", "CKMBINDEX", "TROPONINI" in the last 168 hours.  Lipid Panel: No results for input(s): "CHOL", "TRIG", "HDL", "CHOLHDL", "VLDL", "LDLCALC" in the last 168 hours.  Imaging: CT ANGIO HEAD NECK W WO CM W PERF (CODE STROKE)  Result Date: 05/31/2022 CLINICAL DATA:  46 year old female code stroke EXAM: CT ANGIOGRAPHY HEAD AND NECK CT PERFUSION BRAIN TECHNIQUE: Multidetector CT imaging of the head and neck was performed using the standard protocol during bolus administration of intravenous contrast. Multiplanar CT image  reconstructions and MIPs were obtained to evaluate the vascular anatomy. Carotid stenosis measurements (when applicable) are obtained utilizing NASCET criteria, using the distal internal carotid diameter as the denominator. Multiphase CT imaging of the brain was performed following IV bolus contrast injection. Subsequent parametric perfusion maps were calculated using RAPID software. RADIATION DOSE REDUCTION: This exam was performed according to the departmental dose-optimization program which includes automated exposure control,  adjustment of the mA and/or kV according to patient size and/or use of iterative reconstruction technique. CONTRAST:  148m OMNIPAQUE IOHEXOL 350 MG/ML SOLN COMPARISON:  Plain head CT 0929 hours today. Brain MRI 05/01/2022 and earlier, including CTA head and neck 03/30/2022. FINDINGS: CT Brain Perfusion Findings: ASPECTS: 10 CBF (<30%) Volume: 023mPerfusion (Tmax>6.0s) volume: 46m44mismatch Volume: Not applicable Infarction Location:Not applicable CTA NECK Skeleton: Stable visualized osseous structures. Upper chest: Stable, negative. Other neck: No acute neck soft tissue finding. Aortic arch: Stable 3 vessel arch configuration. Mild arch tortuosity, no arch atherosclerosis. Right carotid system: Stable including retropharyngeal course and segmental stenosis at the right ICA bulb, numerically estimated at 70% over a 3 mm segment. See series 9, image 24. Tortuosity distal to that stenosis again noted. Left carotid system: Stable tortuosity and retropharyngeal course without stenosis. Vertebral arteries: Proximal right subclavian artery and right vertebral artery origin remain within normal limits. Right vertebral artery remains patent to the skull base with no significant plaque or stenosis. Proximal left subclavian artery and left vertebral artery origin remain negative. Non dominant left vertebral artery with tortuous V1 segment. Left vertebral remains patent to the skull base with no  significant plaque or stenosis identified. CTA HEAD Posterior circulation: Patent distal vertebral arteries and vertebrobasilar junction with mild irregularity and stenosis. PICA origins appear patent. Patent basilar artery with mild irregularity, no significant basilar stenosis. Patent SCA and PCA origins. Posterior communicating arteries are diminutive or absent. Left P1 and P2 segments remain patent without significant stenosis. Up to moderate multifocal P3 segment irregularity now on series 11, image 24. Stable chronic irregularity and stenosis of the right P2 segment with attenuated right PCA branches. Anterior circulation: Both ICA siphons remain patent. On the right there is mild to moderate cavernous segment plaque with no significant stenosis. But on the left there is moderate stenosis at the distal cavernous segment on series 10, image 116 which appears atherosclerotic and stable since October. No supraclinoid segment stenosis and both carotid termini remain patent. Patent MCA and ACA origins. Mild A1 segment irregularity is stable. Normal anterior communicating artery. A2 segment irregularity, stenosis, and occlusion. And the more dominant appearing distal right ACA is also occluded in the A3 segment with some reconstitution (series 11, image 21). This appearance is mildly progressed since October. Left MCA M1 segment and bifurcation are patent without stenosis. Left MCA branches are mildly irregular, most pronounced in posterior left M3 branches on series 11, image 28 and not significantly changed. Right MCA M1 segment is mildly to moderately irregular and stenotic on series 9, image 25 which is stable. Right MCA bifurcation remains patent. Right MCA branches appear stable within irregular and truncated M3 branch appearance somewhat diffusely (series 11, image 14). Venous sinuses: Early contrast timing, not well evaluated. Anatomic variants: Mildly dominant right vertebral artery. Affectively dominant  right ACA. Review of the MIP images confirms the above findings IMPRESSION: 1. Negative for emergent large vessel occlusion, and negative CT Perfusion. 2. 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 - Chronically attenuated Right PCA. - Moderate stenosis Left ICA cavernous segment. - Moderate stenosis Right MCA M1 - Moderate bilateral MCA M3 and Left PCA P3 branch stenoses. Salient findings were communicated to Dr. LinCheral Marker 10:13 am on 05/31/2022 by text page via the [facility]aging system. Electronically Signed   By: H  Herminio Heads  On: 05/31/2022 10:14   CT HEAD CODE STROKE WO CONTRAST  Result Date: 05/31/2022 CLINICAL DATA:  Code stroke. 46 year old female. History of previous cerebral infarcts, including left perirolandic infarction in October this year. EXAM: CT HEAD WITHOUT CONTRAST TECHNIQUE: Contiguous axial images were obtained from the base of the skull through the vertex without intravenous contrast. RADIATION DOSE REDUCTION: This exam was performed according to the departmental dose-optimization program which includes automated exposure control, adjustment of the mA and/or kV according to patient size and/or use of iterative reconstruction technique. COMPARISON:  Brain MRI 05/01/2022 and earlier. FINDINGS: Brain: Superior left perirolandic developing encephalomalacia appears stable from last month. Contralateral small chronic right centrum semiovale infarcts better demonstrated by MRI. Chronic right occipital pole encephalomalacia. Chronic right cerebellar encephalomalacia appears progressed by CT since October (series 5, image 43) although seems not significantly changed from previous MRIs and associated with some chronic brainstem and cerebellar peduncle Wallerian degeneration there. Stable cerebral volume. No midline shift, ventriculomegaly, mass  effect, evidence of mass lesion, intracranial hemorrhage or evidence of cortically based acute infarction. Vascular: Mild Calcified atherosclerosis at the skull base. No suspicious intracranial vascular hyperdensity. Skull: Chronic right lamina papyracea fracture. No acute osseous abnormality identified. Sinuses/Orbits: Chronic right lamina papyracea fracture and scattered generally mild sinus disease is stable. Tympanic cavities and mastoids remain aerated. Other: Similar leftward gaze. No acute orbit or scalp soft tissue finding. ASPECTS [facility] Stroke Program Early CT Score) Total score (0-10 with 10 being normal): 10 IMPRESSION: 1. No acute intracranial hemorrhage or cortically based infarction identified. ASPECTS 10. 2. Chronic ischemia, most pronounced in the posterior left MCA and right PCA territories. Right cerebellar encephalomalacia/Wallerian degeneration more apparent by CT but seems stable from previous MRIs. 3. These results were communicated to Dr. Cheral Marker at 9:38 am on 05/31/2022 by text page via the [facility] messaging system. Electronically Signed   By: Genevie Ann M.D.   On: 05/31/2022 09:39     Assessment: 46 y.o. female with a PMHx of strokes (including right middle cerebellar penduncle and pons, as well as left parietal lobe; has residual speech deficit and left sided weakness), intracranial atherosclerotic disease, adjustment disorder with disturbance of conduct, bipolar disorder, polysubstance abuse, cannabis use disorder, severe cocaine use disorder with dependence, opiate use, chronic anemia, DM2, prior HONK and HTN who presents to the ED with new onset of inability to use her legs this morning. She called EMS at that time and on their arrival she was altered but without focal deficits. EMS report BP 200/110, HR 60, RR 20 and saturation 100. In the ED she was not following commands and had neurological symptoms on alternating sides, but at one point what was felt to be persistent left gaze  deviation and right sided weakness was noted, resulting in decision to call a Code Stroke. LKN was sometime within the last 24 hours, but unknown exact time; she was last seen yesterday evening with friends. Of note, she was admitted to [facility]  on 05-31-22 for difficulty walking.  - Initial exam reveals decreased LOC, disoriented, not following commands, left gaze preference , left facial droop, and left decreased sensation with NIHSS 9. During CT follow up exam performed and with noxious stimuli the patient began thrashing all 4 extremities and swatting at examiner in addition to calling out repeatedly "I am cold" and yelling "I am not intoxicated, I don't drink" after she was asked if she was under the influence. There was somewhat decreased thrashing movement on her right  side relative to her left.  - CT Head: No acute intracranial findings. Remote right occipital infarct. More recent late subacute/early chronic high left parietal lobe infarcts as shown on MRI from 03/30/2022. Chronic ischemic microvascular white matter disease. Old healed right medial orbital wall fracture.   - CTA, and CTP. Negative for emergent large vessel occlusion, and negative CT perfusion. Positive for chronic stenosis of the right ICA bulb (70%) and advanced Intracranial Atherosclerosis not significantly changed from October CTA: Left ACA A2 and Right A3 segmental occlusions. Chronically attenuated Right PCA. Moderate stenosis Left ICA cavernous segment. Moderate stenosis Right MCA M1. Moderate bilateral MCA M3 and Left PCA P3 branch stenoses. - Patient is not a candidate for IV Thrombolytic due to out of time window. No friends or family could be contact to determine LKN and patient does not give her LKN despite numerous attempts by several providers to obtain this information.  - Patient is not a candidate for IR due to stroke not suspected and no LVO.  - DDx:  - Given severely elevated BP by EMS that continued after  arrival, hypertensive encephalopathy is felt to be significantly more likely than stroke.  - Opiate, EtOH or other substance intoxication is high on the DDx - No motor activity seen by EMS or on exam in the ED to suggest seizure   Recommendations: - mNIH and VS q2h  - SBP <160.  - Clevidipine gtt has been ordered - Avoid beta blockers due to suspected cocaine use - Continue ASA, Plavix and rosuvastatin for secondary stroke prevention - UDS - EtOH level - Metabolic encephalopathy lab work up.  - If above unrevealing, and/or not improved back to baseline, will consider MRI brain and spot EEG    Electronically signed: Dr. Kerney Elbe 05/31/2022, 10:24 AM

## 2022-05-31 NOTE — ED Notes (Signed)
Pt refusing NIHSS. Pt moving all extremities. And asking for blankets. Pt asked for RN to turn off lights. Call light within reach.

## 2022-06-01 DIAGNOSIS — F1410 Cocaine abuse, uncomplicated: Secondary | ICD-10-CM | POA: Diagnosis not present

## 2022-06-01 DIAGNOSIS — Z794 Long term (current) use of insulin: Secondary | ICD-10-CM | POA: Diagnosis not present

## 2022-06-01 DIAGNOSIS — Z79899 Other long term (current) drug therapy: Secondary | ICD-10-CM | POA: Diagnosis not present

## 2022-06-01 DIAGNOSIS — Z8673 Personal history of transient ischemic attack (TIA), and cerebral infarction without residual deficits: Secondary | ICD-10-CM | POA: Diagnosis not present

## 2022-06-01 DIAGNOSIS — Z7902 Long term (current) use of antithrombotics/antiplatelets: Secondary | ICD-10-CM | POA: Diagnosis not present

## 2022-06-01 DIAGNOSIS — I161 Hypertensive emergency: Secondary | ICD-10-CM | POA: Diagnosis not present

## 2022-06-01 DIAGNOSIS — R4182 Altered mental status, unspecified: Secondary | ICD-10-CM | POA: Diagnosis present

## 2022-06-01 DIAGNOSIS — Z91040 Latex allergy status: Secondary | ICD-10-CM | POA: Diagnosis not present

## 2022-06-01 DIAGNOSIS — F17210 Nicotine dependence, cigarettes, uncomplicated: Secondary | ICD-10-CM | POA: Diagnosis not present

## 2022-06-01 DIAGNOSIS — D509 Iron deficiency anemia, unspecified: Secondary | ICD-10-CM | POA: Diagnosis not present

## 2022-06-01 DIAGNOSIS — I10 Essential (primary) hypertension: Secondary | ICD-10-CM | POA: Diagnosis not present

## 2022-06-01 DIAGNOSIS — I674 Hypertensive encephalopathy: Secondary | ICD-10-CM | POA: Diagnosis not present

## 2022-06-01 DIAGNOSIS — G929 Unspecified toxic encephalopathy: Secondary | ICD-10-CM | POA: Diagnosis not present

## 2022-06-01 DIAGNOSIS — Z7984 Long term (current) use of oral hypoglycemic drugs: Secondary | ICD-10-CM | POA: Diagnosis not present

## 2022-06-01 DIAGNOSIS — Z7982 Long term (current) use of aspirin: Secondary | ICD-10-CM | POA: Diagnosis not present

## 2022-06-01 DIAGNOSIS — E1165 Type 2 diabetes mellitus with hyperglycemia: Secondary | ICD-10-CM | POA: Diagnosis not present

## 2022-06-01 LAB — BASIC METABOLIC PANEL
Anion gap: 6 (ref 5–15)
BUN: 22 mg/dL — ABNORMAL HIGH (ref 6–20)
CO2: 25 mmol/L (ref 22–32)
Calcium: 8.9 mg/dL (ref 8.9–10.3)
Chloride: 108 mmol/L (ref 98–111)
Creatinine, Ser: 0.87 mg/dL (ref 0.44–1.00)
GFR, Estimated: >60 mL/min (ref >60)
Glucose, Bld: 124 mg/dL — ABNORMAL HIGH (ref 70–99)
Potassium: 3.4 mmol/L — ABNORMAL LOW (ref 3.5–5.1)
Sodium: 139 mmol/L (ref 135–145)

## 2022-06-01 LAB — CBC
HCT: 30.7 % — ABNORMAL LOW (ref 36.0–46.0)
Hemoglobin: 9.5 g/dL — ABNORMAL LOW (ref 12.0–15.0)
MCH: 23.9 pg — ABNORMAL LOW (ref 26.0–34.0)
MCHC: 30.9 g/dL (ref 30.0–36.0)
MCV: 77.1 fL — ABNORMAL LOW (ref 80.0–100.0)
Platelets: 245 10*3/uL (ref 150–400)
RBC: 3.98 MIL/uL (ref 3.87–5.11)
RDW: 21.5 % — ABNORMAL HIGH (ref 11.5–15.5)
WBC: 4.1 10*3/uL (ref 4.0–10.5)
nRBC: 0 % (ref 0.0–0.2)

## 2022-06-01 LAB — GLUCOSE, CAPILLARY: Glucose-Capillary: 129 mg/dL — ABNORMAL HIGH (ref 70–99)

## 2022-06-01 LAB — CBG MONITORING, ED: Glucose-Capillary: 187 mg/dL — ABNORMAL HIGH (ref 70–99)

## 2022-06-01 MED ORDER — HYDRALAZINE HCL 20 MG/ML IJ SOLN: 10.0000 mg | INTRAMUSCULAR | Status: DC | PRN

## 2022-06-01 MED ORDER — ACETAMINOPHEN 650 MG RE SUPP: 650.0000 mg | Freq: Four times a day (QID) | RECTAL | Status: DC | PRN

## 2022-06-01 MED ORDER — PANTOPRAZOLE SODIUM 40 MG PO TBEC
40.0000 mg | DELAYED_RELEASE_TABLET | Freq: Every day | ORAL | Status: DC
  Administered 2022-06-01 – 2022-06-04 (×3): 40 mg via ORAL
  Filled 2022-06-01 (×4): qty 1

## 2022-06-01 MED ORDER — ACETAMINOPHEN 325 MG PO TABS
650.0000 mg | ORAL_TABLET | Freq: Four times a day (QID) | ORAL | Status: DC | PRN
  Administered 2022-06-02 – 2022-06-03 (×2): 650 mg via ORAL
  Filled 2022-06-01 (×2): qty 2

## 2022-06-01 MED ORDER — SODIUM CHLORIDE 0.9 % IV SOLN: 250.0000 mL | INTRAVENOUS | Status: DC | PRN

## 2022-06-01 MED ORDER — SODIUM CHLORIDE 0.9% FLUSH: 3.0000 mL | INTRAVENOUS | Status: DC | PRN

## 2022-06-01 MED ORDER — FERROUS SULFATE 325 (65 FE) MG PO TABS
325.0000 mg | ORAL_TABLET | ORAL | Status: DC
  Administered 2022-06-01 – 2022-06-03 (×2): 325 mg via ORAL
  Filled 2022-06-01 (×2): qty 1

## 2022-06-01 MED ORDER — SODIUM CHLORIDE 0.9% FLUSH
3.0000 mL | Freq: Two times a day (BID) | INTRAVENOUS | Status: DC
  Administered 2022-06-01 – 2022-06-04 (×5): 3 mL via INTRAVENOUS

## 2022-06-01 MED ORDER — INSULIN ASPART 100 UNIT/ML IJ SOLN
0.0000 [IU] | Freq: Every day | INTRAMUSCULAR | Status: DC
  Administered 2022-06-03: 2 [IU] via SUBCUTANEOUS

## 2022-06-01 MED ORDER — ALBUTEROL SULFATE (2.5 MG/3ML) 0.083% IN NEBU: 2.5000 mg | INHALATION_SOLUTION | RESPIRATORY_TRACT | Status: DC | PRN

## 2022-06-01 MED ORDER — ENOXAPARIN SODIUM 40 MG/0.4ML IJ SOSY
40.0000 mg | PREFILLED_SYRINGE | INTRAMUSCULAR | Status: DC
  Administered 2022-06-01 – 2022-06-04 (×4): 40 mg via SUBCUTANEOUS
  Filled 2022-06-01 (×4): qty 0.4

## 2022-06-01 MED ORDER — POLYETHYLENE GLYCOL 3350 17 G PO PACK
17.0000 g | PACK | Freq: Every day | ORAL | Status: DC | PRN
  Administered 2022-06-02: 17 g via ORAL
  Filled 2022-06-01: qty 1

## 2022-06-01 MED ORDER — INSULIN ASPART 100 UNIT/ML IJ SOLN
0.0000 [IU] | Freq: Three times a day (TID) | INTRAMUSCULAR | Status: DC
  Administered 2022-06-01: 1 [IU] via SUBCUTANEOUS
  Administered 2022-06-01 – 2022-06-02 (×2): 2 [IU] via SUBCUTANEOUS
  Administered 2022-06-02 (×2): 1 [IU] via SUBCUTANEOUS
  Administered 2022-06-03 – 2022-06-04 (×4): 2 [IU] via SUBCUTANEOUS

## 2022-06-01 NOTE — Evaluation (Signed)
Physical Therapy Evaluation Patient Details Name: Kaitlyn Gray MRN: [id number] DOB: [birth year] Today's Date: 06/01/2022  History of Present Illness  Pt is a 46 y.o. F who presents 05/31/2022 with acute onset of leg weakness and inability to walk. Initial BP by EMS 200/110. CT head without acute stroke. Neurology consulted in ED and felt this was due to hypertensive encephalopathy and not stroke. Significant PMH: strokes with residual dysarthria, facial asymmetry  Clinical Impression  PTA, pt lives alone in a two level apartment with 6 steps to enter and is ambulatory with a Rollator for gait. Pt reports inability to care for herself, frequent falls, and lack of transportation to get to medical appointments. Pt requiring min assist for bed mobility and transfers to standing. She is unable to weight shift to take steps, demonstrating increased truncal sway/ataxia and requesting to sit back down due to dizziness and fear of falling. Pt would benefit from SNF to address deficits listed below and maximize functional mobility.      Recommendations for follow up therapy are one component of a multi-disciplinary discharge planning process, led by the attending physician.  Recommendations may be updated based on patient status, additional functional criteria and insurance authorization.  Follow Up Recommendations Skilled nursing-short term rehab (<3 hours/day) Can patient physically be transported by private vehicle: No    Assistance Recommended at Discharge Frequent or constant Supervision/Assistance  Patient can return home with the following  A lot of help with walking and/or transfers;A lot of help with bathing/dressing/bathroom    Equipment Recommendations None recommended by PT  Recommendations for Other Services       Functional Status Assessment Patient has had a recent decline in their functional status and demonstrates the ability to make significant improvements in function in a  reasonable and predictable amount of time.     Precautions / Restrictions Precautions Precautions: Fall Restrictions Weight Bearing Restrictions: No      Mobility  Bed Mobility Overal bed mobility: Needs Assistance Bed Mobility: Sit to Supine       Sit to supine: Min assist   General bed mobility comments: Pt sitting at end of stretcher upon arrival. MinA for LE assist during transition back to supine    Transfers Overall transfer level: Needs assistance Equipment used: Rolling walker (2 wheels) Transfers: Sit to/from Stand Sit to Stand: Min assist           General transfer comment: MinA to rise to stand from end of stretcher. Pt with increased truncal sway/ataxia, unable to weight shift to take steps forward. Pt reports feeling weak and asking to sit back down. Able to scoot along stretcher back to head of bed    Ambulation/Gait               General Gait Details: unable  Stairs            Wheelchair Mobility    Modified Rankin (Stroke Patients Only)       Balance Overall balance assessment: Needs assistance Sitting-balance support: Feet supported Sitting balance-Leahy Scale: Fair     Standing balance support: Bilateral upper extremity supported Standing balance-Leahy Scale: Poor Standing balance comment: reliant on RW                             Pertinent Vitals/Pain Pain Assessment Pain Assessment: Faces Faces Pain Scale: Hurts a little bit Pain Location: "eyes" Pain Descriptors / Indicators: Sore Pain Intervention(s): Monitored during session  Home Living Family/patient expects to be discharged to:: Private residence Living Arrangements: Alone Available Help at Discharge: Family (rarely available) Type of Home: Apartment Home Access: Stairs to enter Entrance Stairs-Rails: Right Entrance Stairs-Number of Steps: 6 Alternate Level Stairs-Number of Steps: 14 Home Layout: Two level;Bed/bath upstairs Home Equipment:  Rollator (4 wheels);Rolling Walker (2 wheels);BSC/3in1      Prior Function Prior Level of Function : Needs assist             Mobility Comments: rollator for gait ADLs Comments: reports lack of transportation for shopping, unable to manage medication and reports difficulty with household management     Hand Dominance        Extremity/Trunk Assessment   Upper Extremity Assessment Upper Extremity Assessment: Defer to OT evaluation    Lower Extremity Assessment Lower Extremity Assessment: RLE deficits/detail;LLE deficits/detail RLE Deficits / Details: Poor effort. Grossly ~2/5 LLE Deficits / Details: Hx residual weakness from stroke. Pt with poor effort during examination, grossly ~2-/5    Cervical / Trunk Assessment Cervical / Trunk Assessment: Normal  Communication   Communication: Expressive difficulties (dysarthric)  Cognition Arousal/Alertness: Awake/alert Behavior During Therapy: WFL for tasks assessed/performed Overall Cognitive Status: No family/caregiver present to determine baseline cognitive functioning                                 General Comments: Poor historian, impulsive, with decreased awareness of safety/deficits        General Comments      Exercises     Assessment/Plan    PT Assessment Patient needs continued PT services  PT Problem List Decreased strength;Decreased activity tolerance;Decreased balance;Decreased mobility;Decreased safety awareness       PT Treatment Interventions DME instruction;Gait training;Therapeutic activities;Stair training;Functional mobility training;Therapeutic exercise;Balance training;Patient/family education    PT Goals (Current goals can be found in the Care Plan section)  Acute Rehab PT Goals Patient Stated Goal: get more help at home PT Goal Formulation: With patient Time For Goal Achievement: 06/15/22 Potential to Achieve Goals: Good    Frequency Min 3X/week     Co-evaluation                AM-PAC PT "6 Clicks" Mobility  Outcome Measure Help needed turning from your back to your side while in a flat bed without using bedrails?: A Little Help needed moving from lying on your back to sitting on the side of a flat bed without using bedrails?: A Little Help needed moving to and from a bed to a chair (including a wheelchair)?: A Little Help needed standing up from a chair using your arms (e.g., wheelchair or bedside chair)?: A Little Help needed to walk in hospital room?: Total Help needed climbing 3-5 steps with a railing? : Total 6 Click Score: 14    End of Session Equipment Utilized During Treatment: Gait belt Activity Tolerance: Patient tolerated treatment well Patient left: in bed;with call bell/phone within reach Nurse Communication: Mobility status PT Visit Diagnosis: Unsteadiness on feet (R26.81);Difficulty in walking, not elsewhere classified (R26.2);History of falling (Z91.81)    Time: 8413-2440 PT Time Calculation (min) (ACUTE ONLY): 27 min   Charges:   PT Evaluation $PT Eval Low Complexity: 1 Low PT Treatments $Therapeutic Activity: 8-22 mins        Lillia Pauls, PT, DPT Acute Rehabilitation Services Office [phone]   Norval Morton 06/01/2022, 12:55 PM

## 2022-06-01 NOTE — ED Notes (Signed)
PT at bedside.

## 2022-06-01 NOTE — ED Notes (Signed)
ED TO INPATIENT HANDOFF REPORT   Name/Age/Gender Kaitlyn Gray 46 y.o. female Room/Bed: 002C/002C  Code Status   Code Status: Full Code  Home/SNF/Other Home Patient oriented to: self and place Is this baseline? Yes   Triage Complete: Triage complete  Chief Complaint Hypertensive emergency [I16.1]  Triage Note Pt BIB [facility] form home. EMS reported that patient came them not making much sense. She had generalized weakness her GCS was 8.Pt have prior stroke, hx of drug use. EMS report BP 200/110, HR 60, RR 20 and saturation 100.    Allergies Allergies  Allergen Reactions   Hydrocodone Itching   Latex Itching and Rash    Level of Care/Admitting Diagnosis ED Disposition     ED Disposition  Admit   Condition  --   [place]: [place] [[id number]]  Level of Care: Progressive [102]  Admit to Progressive based on following criteria: NEUROLOGICAL AND NEUROSURGICAL complex patients with significant risk of instability, who do not meet ICU criteria, yet require close observation or frequent assessment (< / = every 2 - 4 hours) with medical / nursing intervention.  Admit to Progressive based on following criteria: CARDIOVASCULAR & THORACIC of moderate stability with acute coronary syndrome symptoms/low risk myocardial infarction/hypertensive urgency/arrhythmias/heart failure potentially compromising stability and stable post cardiovascular intervention patients.  May place patient in observation at [facility] or [place] if equivalent level of care is available:: No  Covid Evaluation: Asymptomatic - no recent exposure (last 10 days) testing not required  Diagnosis: Hypertensive emergency [id number]  Admitting Physician: Modena Jansky [[id number]]  Attending Physician: Vernell Leep D [[id number]]          B Medical/Surgery History Past Medical History:  Diagnosis Date   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   Bipolar 1  disorder ([place])    Cannabis use disorder, moderate, dependence ([place]) 03/24/2015   Chronic anemia    Cocaine use disorder, severe, dependence ([employer]) 03/24/2015   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    Encounter for assessment of healthcare decision-making capacity    History of cervical fracture 12/24/2017   nondisplaced fracture lateral mass of C1 on the right on CT 12/24/17   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 ([place]) 07/19/2017   Hypertension    Ischemic stroke ([place]) 01/01/2020   subacute right middle cerebellar peduncle and pons infarction   Left-sided weakness 01/27/2022   Head CT with remote right occipital infarct which is consistent with old left-sided weakness   MDD (major depressive disorder), recurrent severe, 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   Normocytic anemia 02/09/2020   Opiate use    History of Suboxone Therapy until 05/2021   Polysubstance abuse ([place]) 07/19/2017   Prescription drug misadventures ([place])    Past Surgical History:  Procedure Laterality Date   CESAREAN SECTION       A IV Location/Drains/Wounds Patient Lines/Drains/Airways Status     Active Line/Drains/Airways     Name Placement date Placement time Site Days   Peripheral IV 05/31/22 18 G Anterior;Proximal;Right Forearm 05/31/22  0910  Forearm  1   Peripheral IV 05/31/22 20 G 1" Right;Posterior Forearm 05/31/22  1336  Forearm  1            Intake/Output Last 24 hours No intake or output data in the 24 hours ending 06/01/22 1503  Labs/Imaging Results for orders placed or performed during the hospital encounter of 05/31/22 (from the past 48 hour(s))  Protime-INR  Status: None   Collection Time: 05/31/22  9:00 AM  Result Value Ref Range   Prothrombin Time 13.7 11.4 - 15.2 seconds   INR 1.1 0.8 - 1.2    Comment: (NOTE) INR goal varies based on device and disease states. Performed at [facility], [address], [place], [place] [address]   APTT      Status: None   Collection Time: 05/31/22  9:00 AM  Result Value Ref Range   aPTT 26 24 - 36 seconds    Comment: Performed at [facility], [address], [place], [place] [address]  CBC     Status: Abnormal   Collection Time: 05/31/22  9:00 AM  Result Value Ref Range   WBC 6.5 4.0 - 10.5 K/uL   RBC 4.09 3.87 - 5.11 MIL/uL   Hemoglobin 9.7 (L) 12.0 - 15.0 g/dL   HCT 41.3 (L) 24.4 - 01.0 %   MCV 79.2 (L) 80.0 - 100.0 fL   MCH 23.7 (L) 26.0 - 34.0 pg   MCHC 29.9 (L) 30.0 - 36.0 g/dL   RDW 27.2 (H) 53.6 - 64.4 %   Platelets 256 150 - 400 K/uL   nRBC 0.0 0.0 - 0.2 %    Comment: Performed at [facility], [address] [place], [place] [address]  Differential     Status: None   Collection Time: 05/31/22  9:00 AM  Result Value Ref Range   Neutrophils Relative % 78 %   Neutro Abs 5.1 1.7 - 7.7 K/uL   Lymphocytes Relative 15 %   Lymphs Abs 0.9 0.7 - 4.0 K/uL   Monocytes Relative 5 %   Monocytes Absolute 0.3 0.1 - 1.0 K/uL   Eosinophils Relative 1 %   Eosinophils Absolute 0.0 0.0 - 0.5 K/uL   Basophils Relative 1 %   Basophils Absolute 0.0 0.0 - 0.1 K/uL   Immature Granulocytes 0 %   Abs Immature Granulocytes 0.02 0.00 - 0.07 K/uL    Comment: Performed at [facility], [address], [place], [place] [address]  Comprehensive metabolic panel     Status: Abnormal   Collection Time: 05/31/22  9:00 AM  Result Value Ref Range   Sodium 137 135 - 145 mmol/L   Potassium 4.3 3.5 - 5.1 mmol/L   Chloride 105 98 - 111 mmol/L   CO2 23 22 - 32 mmol/L   Glucose, Bld 153 (H) 70 - 99 mg/dL    Comment: Glucose reference range applies only to samples taken after fasting for at least 8 hours.   BUN 13 6 - 20 mg/dL   Creatinine, Ser 3.87 0.44 - 1.00 mg/dL   Calcium 8.9 8.9 - 56.4 mg/dL   Total Protein 7.1 6.5 - 8.1 g/dL   Albumin 3.7 3.5 - 5.0 g/dL   AST 20 15 - 41 U/L   ALT 11 0 - 44 U/L   Alkaline Phosphatase 63 38 - 126 U/L   Total Bilirubin 0.3 0.3 - 1.2 mg/dL   GFR,  Estimated >33 >29 mL/min    Comment: (NOTE) Calculated using the CKD-EPI Creatinine Equation (2021)    Anion gap 9 5 - 15    Comment: Performed at [facility], [address] [place], [place] [address]  Ethanol     Status: None   Collection Time: 05/31/22  9:10 AM  Result Value Ref Range   Alcohol, Ethyl (B) <10 <10 mg/dL    Comment: (NOTE)  Lowest detectable limit for serum alcohol is 10 mg/dL.  For medical purposes only. Performed at [facility], [address], [place], [place] [address]   CBG monitoring, ED     Status: Abnormal   Collection Time: 05/31/22  9:12 AM  Result Value Ref Range   Glucose-Capillary 151 (H) 70 - 99 mg/dL    Comment: Glucose reference range applies only to samples taken after fasting for at least 8 hours.  Urine rapid drug screen (hosp performed)     Status: Abnormal   Collection Time: 05/31/22  9:19 AM  Result Value Ref Range   Opiates NONE DETECTED NONE DETECTED   Cocaine POSITIVE (A) NONE DETECTED   Benzodiazepines NONE DETECTED NONE DETECTED   Amphetamines NONE DETECTED NONE DETECTED   Tetrahydrocannabinol NONE DETECTED NONE DETECTED   Barbiturates NONE DETECTED NONE DETECTED    Comment: (NOTE) DRUG SCREEN FOR MEDICAL PURPOSES ONLY.  IF CONFIRMATION IS NEEDED FOR ANY PURPOSE, NOTIFY LAB WITHIN 5 DAYS.  LOWEST DETECTABLE LIMITS FOR URINE DRUG SCREEN Drug Class                     Cutoff (ng/mL) Amphetamine and metabolites    1000 Barbiturate and metabolites    200 Benzodiazepine                 200 Opiates and metabolites        300 Cocaine and metabolites        300 THC                            50 Performed at [facility], [address] [place], [place] [address]   Urinalysis, Routine w reflex microscopic     Status: Abnormal   Collection Time: 05/31/22  9:19 AM  Result Value Ref Range   Color, Urine COLORLESS (A) YELLOW   APPearance CLEAR CLEAR   Specific Gravity, Urine 1.027 1.005 - 1.030   pH 8.0 5.0 -  8.0   Glucose, UA NEGATIVE NEGATIVE mg/dL   Hgb urine dipstick NEGATIVE NEGATIVE   Bilirubin Urine NEGATIVE NEGATIVE   Ketones, ur NEGATIVE NEGATIVE mg/dL   Protein, ur NEGATIVE NEGATIVE mg/dL   Nitrite NEGATIVE NEGATIVE   Leukocytes,Ua NEGATIVE NEGATIVE    Comment: Performed at [facility], [address] [place], [place] [address]  I-stat chem 8, ED     Status: Abnormal   Collection Time: 05/31/22  9:20 AM  Result Value Ref Range   Sodium 138 135 - 145 mmol/L   Potassium 4.2 3.5 - 5.1 mmol/L   Chloride 106 98 - 111 mmol/L   BUN 15 6 - 20 mg/dL   Creatinine, Ser 9.38 0.44 - 1.00 mg/dL   Glucose, Bld 101 (H) 70 - 99 mg/dL    Comment: Glucose reference range applies only to samples taken after fasting for at least 8 hours.   Calcium, Ion 1.00 (L) 1.15 - 1.40 mmol/L   TCO2 22 22 - 32 mmol/L   Hemoglobin 11.2 (L) 12.0 - 15.0 g/dL   HCT 75.1 (L) 02.5 - 85.2 %  I-Stat beta hCG blood, ED     Status: None   Collection Time: 05/31/22  9:22 AM  Result Value Ref Range   I-stat hCG, quantitative <5.0 <5 mIU/mL   Comment 3            Comment:   GEST. AGE  CONC.  (mIU/mL)   <=1 WEEK        5 - 50     2 WEEKS       50 - 500     3 WEEKS       100 - 10,000     4 WEEKS     1,000 - 30,000        FEMALE AND NON-PREGNANT FEMALE:     LESS THAN 5 mIU/mL   CBG monitoring, ED     Status: Abnormal   Collection Time: 06/01/22  1:53 PM  Result Value Ref Range   Glucose-Capillary 187 (H) 70 - 99 mg/dL    Comment: Glucose reference range applies only to samples taken after fasting for at least 8 hours.   CT ANGIO HEAD NECK W WO CM W PERF (CODE STROKE)  Result Date: 05/31/2022 CLINICAL DATA:  46 year old female code stroke EXAM: CT ANGIOGRAPHY HEAD AND NECK CT PERFUSION BRAIN TECHNIQUE: Multidetector CT imaging of the head and neck was performed using the standard protocol during bolus administration of intravenous contrast. Multiplanar CT image reconstructions and MIPs were obtained to  evaluate the vascular anatomy. Carotid stenosis measurements (when applicable) are obtained utilizing NASCET criteria, using the distal internal carotid diameter as the denominator. Multiphase CT imaging of the brain was performed following IV bolus contrast injection. Subsequent parametric perfusion maps were calculated using RAPID software. RADIATION DOSE REDUCTION: This exam was performed according to the departmental dose-optimization program which includes automated exposure control, adjustment of the mA and/or kV according to patient size and/or use of iterative reconstruction technique. CONTRAST:  136mL OMNIPAQUE IOHEXOL 350 MG/ML SOLN COMPARISON:  Plain head CT 0929 hours today. Brain MRI 05/01/2022 and earlier, including CTA head and neck 03/30/2022. FINDINGS: CT Brain Perfusion Findings: ASPECTS: 10 CBF (<30%) Volume: 84mL Perfusion (Tmax>6.0s) volume: 30mL Mismatch Volume: Not applicable Infarction Location:Not applicable CTA NECK Skeleton: Stable visualized osseous structures. Upper chest: Stable, negative. Other neck: No acute neck soft tissue finding. Aortic arch: Stable 3 vessel arch configuration. Mild arch tortuosity, no arch atherosclerosis. Right carotid system: Stable including retropharyngeal course and segmental stenosis at the right ICA bulb, numerically estimated at 70% over a 3 mm segment. See series 9, image 24. Tortuosity distal to that stenosis again noted. Left carotid system: Stable tortuosity and retropharyngeal course without stenosis. Vertebral arteries: Proximal right subclavian artery and right vertebral artery origin remain within normal limits. Right vertebral artery remains patent to the skull base with no significant plaque or stenosis. Proximal left subclavian artery and left vertebral artery origin remain negative. Non dominant left vertebral artery with tortuous V1 segment. Left vertebral remains patent to the skull base with no significant plaque or stenosis identified. CTA  HEAD Posterior circulation: Patent distal vertebral arteries and vertebrobasilar junction with mild irregularity and stenosis. PICA origins appear patent. Patent basilar artery with mild irregularity, no significant basilar stenosis. Patent SCA and PCA origins. Posterior communicating arteries are diminutive or absent. Left P1 and P2 segments remain patent without significant stenosis. Up to moderate multifocal P3 segment irregularity now on series 11, image 24. Stable chronic irregularity and stenosis of the right P2 segment with attenuated right PCA branches. Anterior circulation: Both ICA siphons remain patent. On the right there is mild to moderate cavernous segment plaque with no significant stenosis. But on the left there is moderate stenosis at the distal cavernous segment on series 10, image 116 which appears atherosclerotic and stable since October. No supraclinoid segment stenosis and both  carotid termini remain patent. Patent MCA and ACA origins. Mild A1 segment irregularity is stable. Normal anterior communicating artery. A2 segment irregularity, stenosis, and occlusion. And the more dominant appearing distal right ACA is also occluded in the A3 segment with some reconstitution (series 11, image 21). This appearance is mildly progressed since October. Left MCA M1 segment and bifurcation are patent without stenosis. Left MCA branches are mildly irregular, most pronounced in posterior left M3 branches on series 11, image 28 and not significantly changed. Right MCA M1 segment is mildly to moderately irregular and stenotic on series 9, image 25 which is stable. Right MCA bifurcation remains patent. Right MCA branches appear stable within irregular and truncated M3 branch appearance somewhat diffusely (series 11, image 14). Venous sinuses: Early contrast timing, not well evaluated. Anatomic variants: Mildly dominant right vertebral artery. Affectively dominant right ACA. Review of the MIP images confirms the  above findings IMPRESSION: 1. Negative for emergent large vessel occlusion, and negative CT Perfusion. 2. 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 - Chronically attenuated Right PCA. - Moderate stenosis Left ICA cavernous segment. - Moderate stenosis Right MCA M1 - Moderate bilateral MCA M3 and Left PCA P3 branch stenoses. Salient findings were communicated to Dr. Cheral Marker at 10:13 am on 05/31/2022 by text page via the [facility] messaging system. Electronically Signed   By: Genevie Ann M.D.   On: 05/31/2022 10:14   CT HEAD CODE STROKE WO CONTRAST  Result Date: 05/31/2022 CLINICAL DATA:  Code stroke. 46 year old female. History of previous cerebral infarcts, including left perirolandic infarction in October this year. EXAM: CT HEAD WITHOUT CONTRAST TECHNIQUE: Contiguous axial images were obtained from the base of the skull through the vertex without intravenous contrast. RADIATION DOSE REDUCTION: This exam was performed according to the departmental dose-optimization program which includes automated exposure control, adjustment of the mA and/or kV according to patient size and/or use of iterative reconstruction technique. COMPARISON:  Brain MRI 05/01/2022 and earlier. FINDINGS: Brain: Superior left perirolandic developing encephalomalacia appears stable from last month. Contralateral small chronic right centrum semiovale infarcts better demonstrated by MRI. Chronic right occipital pole encephalomalacia. Chronic right cerebellar encephalomalacia appears progressed by CT since October (series 5, image 43) although seems not significantly changed from previous MRIs and associated with some chronic brainstem and cerebellar peduncle Wallerian degeneration there. Stable cerebral volume. No midline shift, ventriculomegaly, mass effect, evidence of mass lesion, intracranial  hemorrhage or evidence of cortically based acute infarction. Vascular: Mild Calcified atherosclerosis at the skull base. No suspicious intracranial vascular hyperdensity. Skull: Chronic right lamina papyracea fracture. No acute osseous abnormality identified. Sinuses/Orbits: Chronic right lamina papyracea fracture and scattered generally mild sinus disease is stable. Tympanic cavities and mastoids remain aerated. Other: Similar leftward gaze. No acute orbit or scalp soft tissue finding. ASPECTS [facility] Stroke Program Early CT Score) Total score (0-10 with 10 being normal): 10 IMPRESSION: 1. No acute intracranial hemorrhage or cortically based infarction identified. ASPECTS 10. 2. Chronic ischemia, most pronounced in the posterior left MCA and right PCA territories. Right cerebellar encephalomalacia/Wallerian degeneration more apparent by CT but seems stable from previous MRIs. 3. These results were communicated to Dr. Cheral Marker at 9:38 am on 05/31/2022 by text page via the [facility] messaging system. Electronically Signed   By: Genevie Ann M.D.   On: 05/31/2022 09:39    Pending Labs Unresulted Labs (From admission, onward)  Start     Ordered   06/07/22 0500  Creatinine, serum  (enoxaparin (LOVENOX)    CrCl >/= 30 ml/min)  Weekly,   R     Comments: while on enoxaparin therapy    06/01/22 1017   06/01/22 [id number]  Basic metabolic panel  Tomorrow morning,   R        06/01/22 1017   06/01/22 1018  CBC  Tomorrow morning,   R        06/01/22 1017            Vitals/Pain Today's Vitals   06/01/22 1030 06/01/22 1045 06/01/22 1100 06/01/22 1308  BP:      Pulse: 74 70  73  Resp: (!) 25 (!) 21 18 16   Temp:    98.3 F (36.8 C)  TempSrc:    Oral  SpO2: 99% 100%  100%  PainSc:        Isolation Precautions No active isolations  Medications Medications  clevidipine (CLEVIPREX) infusion 0.5 mg/mL (0 mg/hr Intravenous Stopped 05/31/22 1618)  amLODipine (NORVASC) tablet 10 mg (10 mg Oral Given 06/01/22  0918)  losartan (COZAAR) tablet 100 mg (100 mg Oral Given 06/01/22 0918)  clopidogrel (PLAVIX) tablet 75 mg (75 mg Oral Given 06/01/22 0917)  rosuvastatin (CRESTOR) tablet 40 mg (40 mg Oral Given 06/01/22 0917)  aspirin EC tablet 81 mg (81 mg Oral Given 06/01/22 0917)  pantoprazole (PROTONIX) EC tablet 40 mg (40 mg Oral Given 06/01/22 1313)  ferrous sulfate tablet 325 mg (325 mg Oral Given 06/01/22 1314)  enoxaparin (LOVENOX) injection 40 mg (40 mg Subcutaneous Given 06/01/22 1315)  sodium chloride flush (NS) 0.9 % injection 3 mL (3 mLs Intravenous Given 06/01/22 1316)  sodium chloride flush (NS) 0.9 % injection 3 mL (has no administration in time range)  0.9 %  sodium chloride infusion (has no administration in time range)  acetaminophen (TYLENOL) tablet 650 mg (has no administration in time range)    Or  acetaminophen (TYLENOL) suppository 650 mg (has no administration in time range)  polyethylene glycol (MIRALAX / GLYCOLAX) packet 17 g (has no administration in time range)  albuterol (PROVENTIL) (2.5 MG/3ML) 0.083% nebulizer solution 2.5 mg (has no administration in time range)  hydrALAZINE (APRESOLINE) injection 10 mg (has no administration in time range)  insulin aspart (novoLOG) injection 0-9 Units (2 Units Subcutaneous Given 06/01/22 1429)  insulin aspart (novoLOG) injection 0-5 Units (has no administration in time range)  iohexol (OMNIPAQUE) 350 MG/ML injection 100 mL (100 mLs Intravenous Contrast Given 05/31/22 0932)  lactated ringers bolus 1,000 mL (0 mLs Intravenous Stopped 05/31/22 1458)  naloxone (NARCAN) injection 0.4 mg (0.4 mg Intravenous Given 05/31/22 1015)  hydrALAZINE (APRESOLINE) injection 20 mg (20 mg Intravenous Given 05/31/22 1419)  hydrALAZINE (APRESOLINE) injection 10 mg (10 mg Intravenous Given 05/31/22 1832)    Mobility walks with device High fall risk   Focused Assessments Neuro Assessment Handoff:  Swallow screen pass? Yes  Cardiac Rhythm: Normal sinus  rhythm NIH Stroke Scale ( + Modified Stroke Scale Criteria)  Interval: Shift assessment Level of Consciousness (1a.)   : Not alert, but arousable by minor stimulation to obey, answer, or respond LOC Questions (1b. )   +: Answers one question correctly LOC Commands (1c. )   + : Performs one task correctly Best Gaze (2. )  +: Partial gaze palsy Visual (3. )  +: No visual loss Facial Palsy (4. )    : Normal symmetrical movements Motor Arm, Left (5a. )   +:  No drift Motor Arm, Right (5b. )   +: No drift Motor Leg, Left (6a. )   +: No drift Motor Leg, Right (6b. )   +: No drift Limb Ataxia (7. ): Absent Sensory (8. )   +: Mild-to-moderate sensory loss, patient feels pinprick is less sharp or is dull on the affected side, or there is a loss of superficial pain with pinprick, but patient is aware of being touched Best Language (9. )   +: No aphasia Dysarthria (10. ): Mild-to-moderate dysarthria, patient slurs at least some words and, at worst, can be understood with some difficulty Extinction/Inattention (11.)   +: No Abnormality Modified SS Total  +: 4 Complete NIHSS TOTAL: 7 Last date known well: 05/30/22   Neuro Assessment: Exceptions to WDL Neuro Checks:   Initial (05/31/22 0930)  Last Documented NIHSS Modified Score: 4 (06/01/22 1000) Has TPA been given? No If patient is a Neuro Trauma and patient is going to OR before floor call report to [place] nurse: [phone] or [phone]   R Recommendations: See Admitting Provider Note  Report given to:

## 2022-06-01 NOTE — Progress Notes (Signed)
PROGRESS NOTE   Kaitlyn Gray  TLX:[id number]    DOB: 1975-12-20    DOA: 05/31/2022  PCP: Patient, No Pcp Per   I have briefly reviewed patients previous medical records in [facility].  Chief Complaint  Patient presents with   Altered Mental Status    Brief Narrative:  46 year old female, lives alone, ambulates with the help of a walker, extensive past medical history including not limited to strokes with residual dysarthria, facial asymmetry and left-sided weakness, hypertension, diabetes, polysubstance abuse (severe cocaine use, cannabis, tobacco & opiate), adjustment disorder, bipolar disorder, chronic anemia, presented to the ED via EMS on 05/31/2022 due to acute onset of leg weakness and inability to walk.  Initial BP by EMS 200/110.  CT head without acute stroke.  Neurology consulted in the ED and felt that this was due to hypertensive encephalopathy and not stroke.  Briefly on Cleviprex drip, stopped on evening of admission.  Clinically has improved and stabilized.  Awaiting therapies evaluation to see if she can be discharged home.   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  Suspect multifactorial due to recent cocaine abuse and noncompliance with meds.  Briefly on Cleviprex drip which was discontinued on the evening of admission.  Resumed prior home meds including amlodipine 10 Mg daily, Cozaar 100 Mg daily, as needed IV hydralazine for SBP >160.  Avoid beta-blockers due to cocaine use.  Monitor closely.  Blood pressures reasonably controlled in the 140-150/70-80  range.  Patient reports that she missed her antihypertensives on day of admission.   Acute toxic encephalopathy/hypertensive encephalopathy: Secondary to cocaine abuse.  Imaging negative for acute stroke.  AMS seems to have resolved.  History of CVA with residual left hemiparesis: Neurology consultation appreciated.  Do not feel that she has new stroke.  Imaging findings as noted above.  Continue aspirin 81 Mg daily, Plavix 75 Mg daily and statins.  Tolerating diet.  PT and OT ordered and pending.  If home health recommended, she could be discharged later today for outpatient follow-up.   Type II DM with hyperglycemia: A1c 6.6 in October 2023.  Hold oral meds.  SSI and adjust as needed.  Good inpatient control.  Unclear if patient taking oral hypoglycemics or not.  She is a poor historian.   Essential hypertension: Management as noted above.   Polysubstance abuse (cocaine, cannabis, tobacco and opiates): Volunteers to last using cocaine on 12/13.  Cessation counseled.  Warned her that continued substance abuse puts her at grave danger for recurrent complications and even death.   Bipolar disorder: Appears to be not on any meds at home.    GERD: Continue PPI   Chronic microcytic anemia: Stable.   There is no height or weight on file to calculate BMI.   DVT prophylaxis: enoxaparin (LOVENOX) injection 40 mg Start: 06/01/22 1017  Lovenox   Code Status: Full Code:  Family Communication: None at bedside Disposition:  Status is: Observation The patient remains OBS appropriate and will d/c before 2 midnights.     Consultants:   Neurology PCCM  Procedures:     Antimicrobials:  Subjective:  Overnight events noted.  Patient noncooperative with several aspects of care.  Reports some generalized weakness but otherwise no complaints.  Tolerating diet well.  As per RN, no other acute issues.  Objective:   Vitals:   06/01/22 0915 06/01/22 0917 06/01/22 0930 06/01/22 0945   BP:      Pulse: 71  66 71  Resp: 20  (!) 23 17  Temp:  98.9 F (37.2 C)    TempSrc:  Oral    SpO2: 100%  (!) 85% 100%    General exam: Young female, moderately built and nourished lying comfortably propped up in bed without distress.  Looks improved compared to last evening. Respiratory system: Clear to auscultation. Respiratory effort normal. Cardiovascular system: S1 & S2 heard, RRR. No JVD, murmurs, rubs, gallops or clicks. No pedal edema.  No longer on telemetry,?  Self discontinued. Gastrointestinal system: Abdomen is nondistended, soft and nontender. No organomegaly or masses felt. Normal bowel sounds heard. Central nervous system: Alert and oriented x 3.  Minimal dysarthria, significantly improved compared to last evening.  Mildly diminished right nasolabial fold.  No focal neurological deficits. Extremities: Right side grade 4+ by 5 power.  Left upper extremities proximally 2 x 5 power, distally 3 x 5 power.  Left lower extremity grade 1 x 5 power.  However it appears that patient is not providing full effort. Skin: No rashes, lesions or ulcers Psychiatry: Judgement and insight appear normal. Mood & affect appropriate.     Data Reviewed:   I have personally reviewed following labs and imaging studies   CBC: Recent Labs  Lab 05/31/22 0900 05/31/22 0920  WBC 6.5  --   NEUTROABS 5.1  --   HGB 9.7* 11.2*  HCT 32.4* 33.0*  MCV 79.2*  --   PLT 256  --     Basic Metabolic Panel: Recent Labs  Lab 05/31/22 0900 05/31/22 0920  NA 137 138  K 4.3 4.2  CL 105 106  CO2 23  --   GLUCOSE 153* 154*  BUN 13 15  CREATININE 0.76 0.70  CALCIUM 8.9  --     Liver Function Tests: Recent Labs  Lab 05/31/22 0900  AST 20  ALT 11  ALKPHOS 63  BILITOT 0.3  PROT 7.1  ALBUMIN 3.7    CBG: Recent Labs  Lab 05/31/22 0912  GLUCAP 151*    Microbiology Studies:  No results found for this or any previous visit (from the past 240 hour(s)).  Radiology Studies:  CT ANGIO  HEAD NECK W WO CM W PERF (CODE STROKE)  Result Date: 05/31/2022 CLINICAL DATA:  46 year old female code stroke EXAM: CT ANGIOGRAPHY HEAD AND NECK CT PERFUSION BRAIN TECHNIQUE: Multidetector CT imaging of the head and neck was performed using the standard protocol during bolus administration of intravenous contrast. Multiplanar CT image reconstructions and MIPs were obtained to evaluate the vascular anatomy. Carotid stenosis measurements (when applicable) are obtained utilizing NASCET criteria, using the distal internal carotid diameter as the denominator. Multiphase CT imaging of the brain was performed following IV bolus contrast injection. Subsequent parametric perfusion maps were calculated using RAPID software. RADIATION DOSE REDUCTION: This exam was performed according to the departmental dose-optimization program which includes automated exposure control, adjustment of the mA and/or kV according to patient size and/or use of iterative reconstruction technique. CONTRAST:  OMNIPAQUE IOHEXOL 350 MG/ML SOLN COMPARISON:  Plain head CT 0929 hours today. Brain MRI 05/01/2022 and earlier, including CTA head and neck 03/30/2022. FINDINGS: CT  Brain Perfusion Findings: ASPECTS: 10 CBF (<30%) Volume: 31mL Perfusion (Tmax>6.0s) volume: 67mL Mismatch Volume: Not applicable Infarction Location:Not applicable CTA NECK Skeleton: Stable visualized osseous structures. Upper chest: Stable, negative. Other neck: No acute neck soft tissue finding. Aortic arch: Stable 3 vessel arch configuration. Mild arch tortuosity, no arch atherosclerosis. Right carotid system: Stable including retropharyngeal course and segmental stenosis at the right ICA bulb, numerically estimated at 70% over a 3 mm segment. See series 9, image 24. Tortuosity distal to that stenosis again noted. Left carotid system: Stable tortuosity and retropharyngeal course without stenosis. Vertebral arteries: Proximal right subclavian artery and right vertebral  artery origin remain within normal limits. Right vertebral artery remains patent to the skull base with no significant plaque or stenosis. Proximal left subclavian artery and left vertebral artery origin remain negative. Non dominant left vertebral artery with tortuous V1 segment. Left vertebral remains patent to the skull base with no significant plaque or stenosis identified. CTA HEAD Posterior circulation: Patent distal vertebral arteries and vertebrobasilar junction with mild irregularity and stenosis. PICA origins appear patent. Patent basilar artery with mild irregularity, no significant basilar stenosis. Patent SCA and PCA origins. Posterior communicating arteries are diminutive or absent. Left P1 and P2 segments remain patent without significant stenosis. Up to moderate multifocal P3 segment irregularity now on series 11, image 24. Stable chronic irregularity and stenosis of the right P2 segment with attenuated right PCA branches. Anterior circulation: Both ICA siphons remain patent. On the right there is mild to moderate cavernous segment plaque with no significant stenosis. But on the left there is moderate stenosis at the distal cavernous segment on series 10, image 116 which appears atherosclerotic and stable since October. No supraclinoid segment stenosis and both carotid termini remain patent. Patent MCA and ACA origins. Mild A1 segment irregularity is stable. Normal anterior communicating artery. A2 segment irregularity, stenosis, and occlusion. And the more dominant appearing distal right ACA is also occluded in the A3 segment with some reconstitution (series 11, image 21). This appearance is mildly progressed since October. Left MCA M1 segment and bifurcation are patent without stenosis. Left MCA branches are mildly irregular, most pronounced in posterior left M3 branches on series 11, image 28 and not significantly changed. Right MCA M1 segment is mildly to moderately irregular and stenotic on  series 9, image 25 which is stable. Right MCA bifurcation remains patent. Right MCA branches appear stable within irregular and truncated M3 branch appearance somewhat diffusely (series 11, image 14). Venous sinuses: Early contrast timing, not well evaluated. Anatomic variants: Mildly dominant right vertebral artery. Affectively dominant right ACA. Review of the MIP images confirms the above findings IMPRESSION: 1. Negative for emergent large vessel occlusion, and negative CT Perfusion. 2. 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 - Chronically attenuated Right PCA. - Moderate stenosis Left ICA cavernous segment. - Moderate stenosis Right MCA M1 - Moderate bilateral MCA M3 and Left PCA P3 branch stenoses. Salient findings were communicated to Dr. Otelia Limes at 10:13 am on 05/31/2022 by text page via the [facility] messaging system. Electronically Signed   By: Odessa Fleming M.D.   On: 05/31/2022 10:14   CT HEAD CODE STROKE WO CONTRAST  Result Date: 05/31/2022 CLINICAL DATA:  Code stroke. 46 year old female. History of previous cerebral infarcts, including left perirolandic infarction in October this year. EXAM: CT HEAD WITHOUT CONTRAST TECHNIQUE: Contiguous axial images  were obtained from the base of the skull through the vertex without intravenous contrast. RADIATION DOSE REDUCTION: This exam was performed according to the departmental dose-optimization program which includes automated exposure control, adjustment of the mA and/or kV according to patient size and/or use of iterative reconstruction technique. COMPARISON:  Brain MRI 05/01/2022 and earlier. FINDINGS: Brain: Superior left perirolandic developing encephalomalacia appears stable from last month. Contralateral small chronic right centrum semiovale infarcts better demonstrated by MRI. Chronic right occipital pole  encephalomalacia. Chronic right cerebellar encephalomalacia appears progressed by CT since October (series 5, image 43) although seems not significantly changed from previous MRIs and associated with some chronic brainstem and cerebellar peduncle Wallerian degeneration there. Stable cerebral volume. No midline shift, ventriculomegaly, mass effect, evidence of mass lesion, intracranial hemorrhage or evidence of cortically based acute infarction. Vascular: Mild Calcified atherosclerosis at the skull base. No suspicious intracranial vascular hyperdensity. Skull: Chronic right lamina papyracea fracture. No acute osseous abnormality identified. Sinuses/Orbits: Chronic right lamina papyracea fracture and scattered generally mild sinus disease is stable. Tympanic cavities and mastoids remain aerated. Other: Similar leftward gaze. No acute orbit or scalp soft tissue finding. ASPECTS [facility] Stroke Program Early CT Score) Total score (0-10 with 10 being normal): 10 IMPRESSION: 1. No acute intracranial hemorrhage or cortically based infarction identified. ASPECTS 10. 2. Chronic ischemia, most pronounced in the posterior left MCA and right PCA territories. Right cerebellar encephalomalacia/Wallerian degeneration more apparent by CT but seems stable from previous MRIs. 3. These results were communicated to Dr. Otelia Limes at 9:38 am on 05/31/2022 by text page via the [facility] messaging system. Electronically Signed   By: Odessa Fleming M.D.   On: 05/31/2022 09:39    Scheduled Meds:    amLODipine  10 mg Oral Daily   aspirin EC  81 mg Oral Daily   clopidogrel  75 mg Oral Daily   enoxaparin (LOVENOX) injection  40 mg Subcutaneous Q24H   ferrous sulfate  325 mg Oral QODAY   insulin aspart  0-5 Units Subcutaneous QHS   insulin aspart  0-9 Units Subcutaneous TID WC   losartan  100 mg Oral Daily   pantoprazole  40 mg Oral Daily   rosuvastatin  40 mg Oral Daily   sodium chloride flush  3 mL Intravenous Q12H    Continuous Infusions:     sodium chloride       LOS: 0 days     Marcellus Scott, MD,  FACP, FHM, SFHM, [facility], [facility]   [facility] Advisor [place]     To contact the attending provider between 7A-7P or the covering provider during after hours 7P-7A, please log into the web site [email or web address] and access using universal [place] password for that web site. If you do not have the password, please call the hospital operator.  06/01/2022, 10:17 AM

## 2022-06-01 NOTE — ED Notes (Signed)
Pt refused blood pressure being obtained after many attempts.

## 2022-06-02 ENCOUNTER — Ambulatory Visit: Payer: Medicaid Other | Admitting: Speech Pathology

## 2022-06-02 ENCOUNTER — Ambulatory Visit: Payer: Medicaid Other | Admitting: Physical Therapy

## 2022-06-02 DIAGNOSIS — I161 Hypertensive emergency: Secondary | ICD-10-CM | POA: Diagnosis not present

## 2022-06-02 LAB — GLUCOSE, CAPILLARY
Glucose-Capillary: 155 mg/dL — ABNORMAL HIGH (ref 70–99)
Glucose-Capillary: 141 mg/dL — ABNORMAL HIGH (ref 70–99)
Glucose-Capillary: 142 mg/dL — ABNORMAL HIGH (ref 70–99)
Glucose-Capillary: 156 mg/dL — ABNORMAL HIGH (ref 70–99)

## 2022-06-02 MED ORDER — BISACODYL 5 MG PO TBEC
5.0000 mg | DELAYED_RELEASE_TABLET | Freq: Every day | ORAL | Status: DC | PRN
  Administered 2022-06-02: 5 mg via ORAL
  Filled 2022-06-02 (×2): qty 1

## 2022-06-02 MED ORDER — SENNOSIDES-DOCUSATE SODIUM 8.6-50 MG PO TABS
2.0000 | ORAL_TABLET | Freq: Every day | ORAL | Status: DC
  Administered 2022-06-02 – 2022-06-03 (×2): 2 via ORAL
  Filled 2022-06-02 (×3): qty 2

## 2022-06-02 MED ORDER — POTASSIUM CHLORIDE CRYS ER 20 MEQ PO TBCR
40.0000 meq | EXTENDED_RELEASE_TABLET | Freq: Once | ORAL | Status: AC
  Administered 2022-06-02: 40 meq via ORAL
  Filled 2022-06-02: qty 2

## 2022-06-02 MED ORDER — POLYETHYLENE GLYCOL 3350 17 G PO PACK
17.0000 g | PACK | Freq: Every day | ORAL | Status: DC
  Administered 2022-06-02: 17 g via ORAL
  Filled 2022-06-02 (×3): qty 1

## 2022-06-02 MED ORDER — QUETIAPINE FUMARATE 200 MG PO TABS
200.0000 mg | ORAL_TABLET | Freq: Every day | ORAL | Status: DC
  Administered 2022-06-02 – 2022-06-03 (×2): 200 mg via ORAL
  Filled 2022-06-02 (×2): qty 1

## 2022-06-02 NOTE — Progress Notes (Cosign Needed)
Re: Kaitlyn Gray DOB:03-25-1976 Date:06/02/2022   To Whom It May Concern:  Please be advised that the above-named patient will require a short-term nursing home stay--anticipated 30 days or less for rehabilitation and strengthening. The plan is for home.

## 2022-06-02 NOTE — Progress Notes (Signed)
Patient refused to allow staff to do her CBG stating she was doing anything until her doctor changed her orders and gave her what she wanted.  Patient made aware it was not in her best interest to refuse care or ordered treatments and she still refused.

## 2022-06-02 NOTE — TOC Initial Note (Addendum)
Transition of Care [facility]) - Initial/Assessment Note    Patient Details  Name: Kaitlyn Gray MRN: [id number] Date of Birth: 1976-01-20  Transition of [facility]) CM/SW Contact:    Jinger Neighbors, LCSW Phone Number: 06/02/2022, 4:14 PM  Clinical Narrative:                 CSW met with patient to complete TOC assessment. Pt reports living alone at home with limited natural supports. Pt reports being motivated to go to SNF to eventually return home safely and independently. Pt reports smoking two cigarettes daily, but denies wanting any information on the quit line. CSW explained the work up/fax out process. Work up and fax out complete; awaiting PASRR number   Expected Discharge Plan: Skilled Nursing Facility Barriers to Discharge: SNF Pending bed offer   Patient Goals and CMS Choice Patient states their goals for this hospitalization and ongoing recovery are:: Pt reports she wants to get stronger and does not want to go home without help "this time". CMS Medicare.gov Compare Post Acute Care list provided to:: Patient Choice offered to / list presented to : Patient  Expected Discharge Plan and Services Expected Discharge Plan: [place] arrangements for the past 2 months: Single Family Home                                      Prior Living Arrangements/Services Living arrangements for the past 2 months: Single Family Home Lives with:: Self Patient language and need for interpreter reviewed:: Yes Do you feel safe going back to the place where you live?: Yes      Need for Family Participation in Patient Care: Yes (Comment) Care giver support system in place?: Yes (comment)   Criminal Activity/Legal Involvement Pertinent to Current Situation/Hospitalization: No - Comment as needed  Activities of Daily Living      Permission Sought/Granted                  Emotional Assessment Appearance:: Appears older than stated  age Attitude/Demeanor/Rapport: Ambitious Affect (typically observed): Adaptable Orientation: : Oriented to Self, Oriented to Place, Oriented to  Time, Oriented to Situation Alcohol / Substance Use: Tobacco Use (pt reports she is interested in quitting, but denies wanting information on the quit line.) Psych Involvement: No (comment)  Admission diagnosis:  Hypertensive encephalopathy [I67.4] Hypertensive emergency [I16.1] Patient Active Problem List   Diagnosis Date Noted   Hypertensive emergency 05/31/2022   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) 05/01/2022   Iron deficiency anemia 04/07/2022   Fall at home, initial encounter 04/07/2022   Ambulatory dysfunction 04/07/2022   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 04/07/2022   Stroke (cerebrum) ([place]) 04/06/2022   Class 1 obesity 04/06/2022   Tobacco abuse 04/06/2022   Abdominal pain 04/04/2022   History of stroke 03/31/2022   Hemiparesis affecting left side as late effect of cerebrovascular accident (CVA) ([place]) 03/31/2022   Opiate use    Microcytic anemia 03/30/2022   Chronic anemia    Noncompliance with medications    CVA (cerebral vascular accident) ([place]) 09/06/2020   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   History of cervical fracture 12/24/2017   Type 2 diabetes mellitus ([place]) 12/06/2017   Hypertension associated with diabetes ([place]) 12/06/2017   Bipolar disorder ([place]) 07/19/2017   Polysubstance abuse ([place]) 07/19/2017  Cocaine use disorder, severe, dependence ([place]) 03/24/2015   Depression 03/24/2015   Cannabis abuse 03/23/2013   PCP:  Patient, No Pcp Per Pharmacy:   [facility] [place], [place] [address] Phone: [phone] Fax: [phone]  [id number] - [place], [place] [address] [place] [address] Phone: [phone] Fax: [phone]  [facility] [address] [place] [address] Phone: [phone] Fax: [phone]     Social Determinants of Health (SDOH) Interventions    Readmission Risk Interventions    05/02/2022    1:55 PM 04/09/2022   11:37 AM  Readmission Risk Prevention Plan  Transportation Screening Complete Complete  Medication Review ([place]) Complete Complete  PCP or Specialist appointment within 3-5 days of discharge Complete Complete  HRI or Home Care Consult Complete Complete  SW Recovery Care/Counseling Consult Complete   Palliative Care Screening Not Applicable Not Applicable  Skilled Nursing Facility Complete Complete

## 2022-06-02 NOTE — [place] FL2 (Signed)
[place] MEDICAID FL2 LEVEL OF CARE FORM     IDENTIFICATION  Patient Name: Kaitlyn Gray Birthdate: 1976/02/05 Sex: female Admission Date (Current Location): 05/31/2022  [facility] and [place] Number:  Producer, television/film/video and Address:  [facility] [place], 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) 05/01/2022   Iron deficiency anemia 04/07/2022   Fall at home, initial encounter 04/07/2022   Ambulatory dysfunction 04/07/2022   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 04/07/2022   Stroke (cerebrum) (HCC) 04/06/2022   Class 1 obesity 04/06/2022   Tobacco abuse 04/06/2022   Abdominal pain 04/04/2022   History of stroke 03/31/2022   Hemiparesis affecting left side as late effect of cerebrovascular accident (CVA) (HCC) 03/31/2022   Opiate use    Microcytic anemia 03/30/2022   Chronic anemia    Noncompliance with medications    CVA (cerebral vascular accident) (HCC) 09/06/2020   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   History of cervical fracture 12/24/2017   Type 2 diabetes mellitus (HCC) 12/06/2017   Hypertension associated with diabetes (HCC) 12/06/2017   Bipolar disorder (HCC) 07/19/2017   Polysubstance abuse (HCC) 07/19/2017   Cocaine use disorder, severe, dependence (HCC) 03/24/2015   Depression 03/24/2015   Cannabis abuse 03/23/2013    Orientation RESPIRATION BLADDER Height & Weight     Self, Time, Situation, Place  Normal  External catheter, Continent Weight:   Height:     BEHAVIORAL SYMPTOMS/MOOD NEUROLOGICAL BOWEL NUTRITION STATUS      Continent Diet (see d/c summary)  AMBULATORY STATUS COMMUNICATION OF NEEDS Skin   Limited Assist Verbally Normal                       Personal Care Assistance Level of Assistance  Bathing, Feeding, Dressing Bathing Assistance: Limited assistance Feeding assistance: Limited assistance Dressing Assistance: Limited assistance     Functional Limitations Info  Sight, Hearing, Speech Sight Info: Adequate Hearing Info: Adequate Speech Info: Adequate    SPECIAL CARE FACTORS FREQUENCY  PT (By licensed PT), OT (By licensed OT)     PT Frequency: 5x/wk OT Frequency: 5x/wk            Contractures Contractures Info: Not present    Additional Factors Info  Code Status, Allergies Code Status Info: full Allergies Info: Hydrocodone; latex           Current Medications (06/02/2022):  This is the current hospital active medication list Current Facility-Administered Medications  Medication Dose Route Frequency Provider Last Rate Last Admin   0.9 %  sodium chloride infusion  250 mL Intravenous PRN Hongalgi, Maximino Greenland, MD       acetaminophen (TYLENOL) tablet 650 mg  650 mg Oral Q6H PRN Elease Etienne, MD   650 mg at 06/02/22 1056   Or   acetaminophen (TYLENOL) suppository 650 mg  650 mg Rectal Q6H PRN Elease Etienne, MD  albuterol (PROVENTIL) (2.5 MG/3ML) 0.083% nebulizer solution 2.5 mg  2.5 mg Nebulization Q2H PRN Hongalgi, Anand D, MD       amLODipine (NORVASC) tablet 10 mg  10 mg Oral Daily Marcellus Scott D, MD   10 mg at 06/02/22 1047   aspirin EC tablet 81 mg  81 mg Oral Daily Hongalgi, Theadora Rama D, MD   81 mg at 06/02/22 1047   bisacodyl (DULCOLAX) EC tablet 5 mg  5 mg Oral Daily PRN Elease Etienne, MD       clopidogrel (PLAVIX) tablet 75 mg  75 mg Oral Daily Hongalgi, Theadora Rama D, MD   75 mg at 06/02/22 1047   enoxaparin (LOVENOX) injection 40 mg  40  mg Subcutaneous Q24H Hongalgi, Anand D, MD   40 mg at 06/02/22 1047   ferrous sulfate tablet 325 mg  325 mg Oral QODAY Elease Etienne, MD   325 mg at 06/01/22 1314   hydrALAZINE (APRESOLINE) injection 10 mg  10 mg Intravenous Q4H PRN Hongalgi, Anand D, MD       insulin aspart (novoLOG) injection 0-5 Units  0-5 Units Subcutaneous QHS Hongalgi, Anand D, MD       insulin aspart (novoLOG) injection 0-9 Units  0-9 Units Subcutaneous TID WC Hongalgi, Maximino Greenland, MD   1 Units at 06/02/22 1331   losartan (COZAAR) tablet 100 mg  100 mg Oral Daily Marcellus Scott D, MD   100 mg at 06/02/22 1047   pantoprazole (PROTONIX) EC tablet 40 mg  40 mg Oral Daily Marcellus Scott D, MD   40 mg at 06/02/22 1047   polyethylene glycol (MIRALAX / GLYCOLAX) packet 17 g  17 g Oral Daily Hongalgi, Anand D, MD       QUEtiapine (SEROQUEL) tablet 200 mg  200 mg Oral QHS Hongalgi, Anand D, MD       rosuvastatin (CRESTOR) tablet 40 mg  40 mg Oral Daily Hongalgi, Theadora Rama D, MD   40 mg at 06/02/22 1047   senna-docusate (Senokot-S) tablet 2 tablet  2 tablet Oral Daily Hongalgi, Anand D, MD       sodium chloride flush (NS) 0.9 % injection 3 mL  3 mL Intravenous Q12H Hongalgi, Anand D, MD   3 mL at 06/01/22 2143   sodium chloride flush (NS) 0.9 % injection 3 mL  3 mL Intravenous PRN Elease Etienne, MD         Discharge Medications: Please see discharge summary for a list of discharge medications.  Relevant Imaging Results:  Relevant Lab Results:   Additional Information SS#: [id number]  Tory Emerald, LCSW

## 2022-06-02 NOTE — Progress Notes (Signed)
Patient awakened this am with a better disposition and was agreeable to allow me to get her vitals signs and she took her potassium replacement ordered.  Patient reminded not to take medications unless given by hospital staff and that she could retreive her bottle of pills from the pharmacy at time of discharge.

## 2022-06-02 NOTE — Progress Notes (Signed)
Patient refused to allow me to get her vital signs due for midnight stating leave me alone.  Patient made aware it was not in her best interest to refuse care or ordered treatments and she still refused.

## 2022-06-02 NOTE — Progress Notes (Signed)
PROGRESS NOTE   OTHELLA SLAPPEY  UYQ:[id number]    DOB: [birth year]    DOA: 05/31/2022  PCP: Patient, No Pcp Per   I have briefly reviewed patients previous medical records in [facility].  Chief Complaint  Patient presents with   Altered Mental Status    Brief Narrative:  46 year old female, lives alone, ambulates with the help of a walker, extensive past medical history including not limited to strokes with residual dysarthria, facial asymmetry and left-sided weakness, hypertension, diabetes, polysubstance abuse (severe cocaine use, cannabis, tobacco & opiate), adjustment disorder, bipolar disorder, chronic anemia, presented to the ED via EMS on 05/31/2022 due to acute onset of leg weakness and inability to walk.  Initial BP by EMS 200/110.  CT head without acute stroke.  Neurology consulted in the ED and felt that this was due to hypertensive encephalopathy and not stroke.  Briefly on Cleviprex drip, stopped on evening of admission.  Clinically has improved and stabilized.  PT recommended SNF, patient agreeable, awaiting TOC input.   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  Suspect multifactorial due to recent cocaine abuse and noncompliance with meds.  Briefly on Cleviprex drip which was discontinued on the evening of admission.  Resumed prior home meds including amlodipine 10 Mg daily, Cozaar 100 Mg daily, as needed IV hydralazine for SBP >160.  Avoid beta-blockers due to cocaine use.  Monitor closely.  Blood pressures controlled.  Patient reports that she missed her  antihypertensives on day of admission.   Acute toxic encephalopathy/hypertensive encephalopathy: Secondary to cocaine abuse.  Imaging negative for acute stroke.  AMS resolved.  History of CVA with residual left hemiparesis: Neurology consultation appreciated.  Do not feel that she has new stroke.  Imaging findings as noted above.  Continue aspirin 81 Mg daily, Plavix 75 Mg daily and statins.  Tolerating diet.  PT recommends SNF, patient agreeable.  OT at bedside and evaluating.  Patient is medically optimized for DC pending SNF bed.  TOC consulted.   Type II DM with hyperglycemia: A1c 6.6 in October 2023.  Hold oral meds.  SSI and adjust as needed.  Good inpatient control.  Unclear if patient taking oral hypoglycemics or not.  She is a poor historian.  Patient insists on changing to regular diet, done.   Essential hypertension: Management as noted above.   Polysubstance abuse (cocaine, cannabis, tobacco and opiates): Volunteers to last using cocaine on 12/13.  Cessation counseled.  Warned her that continued substance abuse puts her at grave danger for recurrent complications and even death.   Bipolar disorder: Although pharmacy home med rec indicates that she was not taking Seroquel, she insist that she is taking it and wants it started tonight.   GERD: Continue PPI   Chronic microcytic anemia: Stable.   There is no height or weight on file to calculate BMI.   DVT prophylaxis: enoxaparin (LOVENOX) injection 40 mg Start: 06/01/22 1017  Lovenox   Code Status: Full Code:  Family Communication: None at bedside Disposition:  Despite care having crossed 2 midnights, no medical intensity to recommend inpatient change.  She  has been medically ready for discharge since yesterday, awaiting disposition.     Consultants:   Neurology PCCM  Procedures:     Antimicrobials:      Subjective:  Seen along with OT at bedside.  Insisting on diet being changed to regular diet because she  has limited choices with the current diet.  This despite advising her that this is not in her best interest given her diabetes, stroke, hyperlipidemia etc.  Also asking to restart her Seroquel tonight which she insist that she was taking PTA.  Objective:   Vitals:   06/01/22 1621 06/01/22 1958 06/02/22 0602 06/02/22 0741  BP: (!) 160/80 (!) 170/89 (!) 150/83 (!) 148/79  Pulse: 75 78 62 68  Resp: 17 17 19 20   Temp: 97.9 F (36.6 C) 98.3 F (36.8 C) 98.2 F (36.8 C) (!) 97.5 F (36.4 C)  TempSrc: Oral Oral  Oral  SpO2: 97% 96% 99% 99%    General exam: Young female, moderately built and nourished sitting up comfortably at edge of bed independently Respiratory system: Clear to auscultation.  No increased work of breathing. Cardiovascular system: S1 & S2 heard, RRR. No JVD, murmurs, rubs, gallops or clicks. No pedal edema.  Telemetry personally reviewed: Sinus rhythm. Gastrointestinal system: Abdomen is nondistended, soft and nontender. No organomegaly or masses felt. Normal bowel sounds heard. Central nervous system: Alert and oriented x 3.  Minimal dysarthria.  Mildly diminished right nasolabial fold.  No other focal neurological deficits. Extremities: Right side grade 4+ by 5 power.  Left upper extremities proximally 2 x 5 power, distally 4 x 5 power.  Left lower extremity grade 2 x 5 power.   Skin: No rashes, lesions or ulcers Psychiatry: Judgement and insight appear impaired. Mood & affect appropriate.     Data Reviewed:   I have personally reviewed following labs and imaging studies   CBC: Recent Labs  Lab 05/31/22 0900 05/31/22 0920 06/01/22 1650  WBC 6.5  --  4.1  NEUTROABS 5.1  --   --   HGB 9.7* 11.2* 9.5*  HCT 32.4* 33.0* 30.7*  MCV 79.2*  --  77.1*  PLT 256  --  245    Basic Metabolic Panel: Recent Labs  Lab 05/31/22 0900 05/31/22 0920 06/01/22 1650  NA 137 138 139  K 4.3 4.2 3.4*  CL 105 106 108  CO2 23  --  25  GLUCOSE 153* 154* 124*  BUN 13 15 22*   CREATININE 0.76 0.70 0.87  CALCIUM 8.9  --  8.9    Liver Function Tests: Recent Labs  Lab 05/31/22 0900  AST 20  ALT 11  ALKPHOS 63  BILITOT 0.3  PROT 7.1  ALBUMIN 3.7    CBG: Recent Labs  Lab 06/01/22 1353 06/01/22 1622 06/02/22 0624  GLUCAP 187* 129* 155*    Microbiology Studies:  No results found for this or any previous visit (from the past 240 hour(s)).  Radiology Studies:  CT ANGIO HEAD NECK W WO CM W PERF (CODE STROKE)  Result Date: 05/31/2022 CLINICAL DATA:  46 year old female code stroke EXAM: CT ANGIOGRAPHY HEAD AND NECK CT PERFUSION BRAIN TECHNIQUE: Multidetector CT imaging of the head and neck was performed using the standard protocol during bolus administration of intravenous contrast. Multiplanar CT image reconstructions and MIPs were obtained to evaluate the vascular anatomy. Carotid stenosis measurements (when applicable) are obtained utilizing NASCET criteria, using the distal internal carotid diameter as the denominator. Multiphase CT imaging of the brain was performed following  IV bolus contrast injection. Subsequent parametric perfusion maps were calculated using RAPID software. RADIATION DOSE REDUCTION: This exam was performed according to the departmental dose-optimization program which includes automated exposure control, adjustment of the mA and/or kV according to patient size and/or use of iterative reconstruction technique. CONTRAST:  OMNIPAQUE IOHEXOL 350 MG/ML SOLN COMPARISON:  Plain head CT 0929 hours today. Brain MRI 05/01/2022 and earlier, including CTA head and neck 03/30/2022. FINDINGS: CT Brain Perfusion Findings: ASPECTS: 10 CBF (<30%) Volume: 46mL Perfusion (Tmax>6.0s) volume: 91mL Mismatch Volume: Not applicable Infarction Location:Not applicable CTA NECK Skeleton: Stable visualized osseous structures. Upper chest: Stable, negative. Other neck: No acute neck soft tissue finding. Aortic arch: Stable 3 vessel arch configuration. Mild arch  tortuosity, no arch atherosclerosis. Right carotid system: Stable including retropharyngeal course and segmental stenosis at the right ICA bulb, numerically estimated at 70% over a 3 mm segment. See series 9, image 24. Tortuosity distal to that stenosis again noted. Left carotid system: Stable tortuosity and retropharyngeal course without stenosis. Vertebral arteries: Proximal right subclavian artery and right vertebral artery origin remain within normal limits. Right vertebral artery remains patent to the skull base with no significant plaque or stenosis. Proximal left subclavian artery and left vertebral artery origin remain negative. Non dominant left vertebral artery with tortuous V1 segment. Left vertebral remains patent to the skull base with no significant plaque or stenosis identified. CTA HEAD Posterior circulation: Patent distal vertebral arteries and vertebrobasilar junction with mild irregularity and stenosis. PICA origins appear patent. Patent basilar artery with mild irregularity, no significant basilar stenosis. Patent SCA and PCA origins. Posterior communicating arteries are diminutive or absent. Left P1 and P2 segments remain patent without significant stenosis. Up to moderate multifocal P3 segment irregularity now on series 11, image 24. Stable chronic irregularity and stenosis of the right P2 segment with attenuated right PCA branches. Anterior circulation: Both ICA siphons remain patent. On the right there is mild to moderate cavernous segment plaque with no significant stenosis. But on the left there is moderate stenosis at the distal cavernous segment on series 10, image 116 which appears atherosclerotic and stable since October. No supraclinoid segment stenosis and both carotid termini remain patent. Patent MCA and ACA origins. Mild A1 segment irregularity is stable. Normal anterior communicating artery. A2 segment irregularity, stenosis, and occlusion. And the more dominant appearing distal  right ACA is also occluded in the A3 segment with some reconstitution (series 11, image 21). This appearance is mildly progressed since October. Left MCA M1 segment and bifurcation are patent without stenosis. Left MCA branches are mildly irregular, most pronounced in posterior left M3 branches on series 11, image 28 and not significantly changed. Right MCA M1 segment is mildly to moderately irregular and stenotic on series 9, image 25 which is stable. Right MCA bifurcation remains patent. Right MCA branches appear stable within irregular and truncated M3 branch appearance somewhat diffusely (series 11, image 14). Venous sinuses: Early contrast timing, not well evaluated. Anatomic variants: Mildly dominant right vertebral artery. Affectively dominant right ACA. Review of the MIP images confirms the above findings IMPRESSION: 1. Negative for emergent large vessel occlusion, and negative CT Perfusion. 2. 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 - Chronically attenuated Right PCA. - Moderate stenosis Left ICA cavernous segment. - Moderate stenosis Right MCA M1 - Moderate bilateral MCA M3 and Left PCA P3 branch  stenoses. Salient findings were communicated to Dr. Otelia Limes at 10:13 am on 05/31/2022 by text page via the [facility] messaging system. Electronically Signed   By: Odessa Fleming M.D.   On: 05/31/2022 10:14   CT HEAD CODE STROKE WO CONTRAST  Result Date: 05/31/2022 CLINICAL DATA:  Code stroke. 46 year old female. History of previous cerebral infarcts, including left perirolandic infarction in October this year. EXAM: CT HEAD WITHOUT CONTRAST TECHNIQUE: Contiguous axial images were obtained from the base of the skull through the vertex without intravenous contrast. RADIATION DOSE REDUCTION: This exam was performed according to the departmental dose-optimization program  which includes automated exposure control, adjustment of the mA and/or kV according to patient size and/or use of iterative reconstruction technique. COMPARISON:  Brain MRI 05/01/2022 and earlier. FINDINGS: Brain: Superior left perirolandic developing encephalomalacia appears stable from last month. Contralateral small chronic right centrum semiovale infarcts better demonstrated by MRI. Chronic right occipital pole encephalomalacia. Chronic right cerebellar encephalomalacia appears progressed by CT since October (series 5, image 43) although seems not significantly changed from previous MRIs and associated with some chronic brainstem and cerebellar peduncle Wallerian degeneration there. Stable cerebral volume. No midline shift, ventriculomegaly, mass effect, evidence of mass lesion, intracranial hemorrhage or evidence of cortically based acute infarction. Vascular: Mild Calcified atherosclerosis at the skull base. No suspicious intracranial vascular hyperdensity. Skull: Chronic right lamina papyracea fracture. No acute osseous abnormality identified. Sinuses/Orbits: Chronic right lamina papyracea fracture and scattered generally mild sinus disease is stable. Tympanic cavities and mastoids remain aerated. Other: Similar leftward gaze. No acute orbit or scalp soft tissue finding. ASPECTS [facility] Stroke Program Early CT Score) Total score (0-10 with 10 being normal): 10 IMPRESSION: 1. No acute intracranial hemorrhage or cortically based infarction identified. ASPECTS 10. 2. Chronic ischemia, most pronounced in the posterior left MCA and right PCA territories. Right cerebellar encephalomalacia/Wallerian degeneration more apparent by CT but seems stable from previous MRIs. 3. These results were communicated to Dr. Otelia Limes at 9:38 am on 05/31/2022 by text page via the [facility] messaging system. Electronically Signed   By: Odessa Fleming M.D.   On: 05/31/2022 09:39    Scheduled Meds:    amLODipine  10 mg Oral Daily   aspirin  EC  81 mg Oral Daily   clopidogrel  75 mg Oral Daily   enoxaparin (LOVENOX) injection  40 mg Subcutaneous Q24H   ferrous sulfate  325 mg Oral QODAY   insulin aspart  0-5 Units Subcutaneous QHS   insulin aspart  0-9 Units Subcutaneous TID WC   losartan  100 mg Oral Daily   pantoprazole  40 mg Oral Daily   QUEtiapine  200 mg Oral QHS   rosuvastatin  40 mg Oral Daily   sodium chloride flush  3 mL Intravenous Q12H    Continuous Infusions:    sodium chloride       LOS: 0 days     Marcellus Scott, MD,  FACP, FHM, SFHM, [facility], [facility]   [facility] [place]     To contact the attending provider between 7A-7P or the covering provider during after hours 7P-7A, please log into the web site [email or web address] and access using universal [place] password for that web site. If you do not have the password, please call the hospital operator.  06/02/2022, 8:42 AM

## 2022-06-02 NOTE — Evaluation (Signed)
Occupational Therapy Evaluation Patient Details Name: Kaitlyn Gray MRN: [id number] DOB: 03/15/1976 Today's Date: 06/02/2022   History of Present Illness Pt is a 46 y.o. F who presents 05/31/2022 with acute onset of leg weakness and inability to walk. Initial BP by EMS 200/110. CT head without acute stroke. Neurology consulted in ED and felt this was due to hypertensive encephalopathy and not stroke. Significant PMH: strokes with residual dysarthria, facial asymmetry   Clinical Impression   Prior to this admission, patient living alone, and reporting increased difficulty with ADLs and ambulation due to weakness. Currently, patient presenting with inconsistencies throughout evaluation, reporting dizziness, fatigue, and weakness. Patient unable to hold LUE up against gravity when testing MMT on LUE, not maintaining a grip with LUE, however using arm fully to feed self and complete FMC with utensils, also using LUE to push off of bed and come into standing,  with good grip on RW. Patient min guard for bed mobility and sit<>stand transfers, however patient refusing further ambulation. Patient min A for ADLs. OT recommending SNF due to current level as patient needs to return to living alone. OT will continue to follow.      Recommendations for follow up therapy are one component of a multi-disciplinary discharge planning process, led by the attending physician.  Recommendations may be updated based on patient status, additional functional criteria and insurance authorization.   Follow Up Recommendations  Skilled nursing-short term rehab (<3 hours/day)     Assistance Recommended at Discharge Set up Supervision/Assistance  Patient can return home with the following A little help with walking and/or transfers;A lot of help with bathing/dressing/bathroom;Direct supervision/assist for medications management;Direct supervision/assist for financial management;Assist for transportation;Help with stairs  or ramp for entrance    Functional Status Assessment  Patient has had a recent decline in their functional status and demonstrates the ability to make significant improvements in function in a reasonable and predictable amount of time.  Equipment Recommendations  Other (comment) (Defer to next venue)    Recommendations for Other Services       Precautions / Restrictions Precautions Precautions: Fall Restrictions Weight Bearing Restrictions: No      Mobility Bed Mobility Overal bed mobility: Needs Assistance Bed Mobility: Sit to Supine       Sit to supine: Min guard   General bed mobility comments: Able to complete without assist, extra time required, min gaurd due to increased sway at trunk with movement    Transfers Overall transfer level: Needs assistance Equipment used: Rolling walker (2 wheels) Transfers: Sit to/from Stand Sit to Stand: Min guard           General transfer comment: Min gaurd to stand, increased sway in standing, able to weight shift, march in place, and take a few steps forward and backward, declining OOB to the chair      Balance Overall balance assessment: Needs assistance Sitting-balance support: Feet supported Sitting balance-Leahy Scale: Fair     Standing balance support: Bilateral upper extremity supported Standing balance-Leahy Scale: Poor Standing balance comment: reliant on RW                           ADL either performed or assessed with clinical judgement   ADL Overall ADL's : Needs assistance/impaired Eating/Feeding: Set up;Sitting   Grooming: Set up;Sitting   Upper Body Bathing: Minimal assistance;Sitting   Lower Body Bathing: Moderate assistance;Sit to/from stand;Sitting/lateral leans   Upper Body Dressing : Minimal assistance;Standing Upper  Body Dressing Details (indicate cue type and reason): to don gown Lower Body Dressing: Moderate assistance;Sitting/lateral leans;Sit to/from stand   Toilet  Transfer: Min guard;Ambulation;Cueing for sequencing;Cueing for safety   Toileting- Clothing Manipulation and Hygiene: Minimal assistance;Sit to/from stand;Sitting/lateral lean       Functional mobility during ADLs: Minimal assistance;Cueing for safety;Cueing for sequencing;Rolling walker (2 wheels) General ADL Comments: Patient presenting with inconsistencies in presentation, poor activity tolerance, and reported dizziness when attempting movement     Vision Baseline Vision/History: 0 No visual deficits Patient Visual Report: Other (comment) (Patient stating she was dizzy and couldnt see, BP assessed in appropriate range, inconsistent performance when vision assessed during functional tasks)       Perception     Praxis      Pertinent Vitals/Pain Pain Assessment Pain Assessment: No/denies pain     Hand Dominance     Extremity/Trunk Assessment Upper Extremity Assessment Upper Extremity Assessment: Generalized weakness;Difficult to assess due to impaired cognition (Patient with poor effort, unable to hold LUE up against gravity when tested, not maintaining a grip with LUE, however using arm fully to feed self and complete FMC with utensils, also using LUE to push off of bed and come into standing, good grip on RW)   Lower Extremity Assessment Lower Extremity Assessment: Defer to PT evaluation   Cervical / Trunk Assessment Cervical / Trunk Assessment: Normal   Communication Communication Communication: Expressive difficulties (dysarthric)   Cognition Arousal/Alertness: Awake/alert Behavior During Therapy: Impulsive Overall Cognitive Status: Impaired/Different from baseline Area of Impairment: Safety/judgement, Awareness, Problem solving                         Safety/Judgement: Decreased awareness of deficits, Decreased awareness of safety Awareness: Emergent Problem Solving: Difficulty sequencing, Requires verbal cues, Slow processing, Decreased  initiation General Comments: Poor historian, impulsive, with decreased awareness of safety/deficits     General Comments       Exercises     Shoulder Instructions      Home Living Family/patient expects to be discharged to:: Private residence Living Arrangements: Alone Available Help at Discharge: Family (rarely available) Type of Home: Apartment Home Access: Stairs to enter [employer] of Steps: 6 Entrance Stairs-Rails: Right Home Layout: Two level;Bed/bath upstairs Alternate Level Stairs-Number of Steps: 14 Alternate Level Stairs-Rails: Right;Left Bathroom Shower/Tub: Chief Strategy Officer: Standard Bathroom Accessibility: No   Home Equipment: Rollator (4 wheels);Rolling Walker (2 wheels);BSC/3in1          Prior Functioning/Environment Prior Level of Function : Needs assist             Mobility Comments: rollator for gait ADLs Comments: reports lack of transportation for shopping, unable to manage medication and reports difficulty with household management        OT Problem List: Decreased strength;Decreased range of motion;Decreased activity tolerance;Impaired balance (sitting and/or standing);Decreased coordination;Decreased cognition;Decreased knowledge of use of DME or AE;Decreased safety awareness;Decreased knowledge of precautions;Obesity      OT Treatment/Interventions: Self-care/ADL training;Therapeutic exercise;Neuromuscular education;Energy conservation;DME and/or AE instruction;Manual therapy;Therapeutic activities;Cognitive remediation/compensation;Patient/family education;Balance training    OT Goals(Current goals can be found in the care plan section) Acute Rehab OT Goals Patient Stated Goal: to get to rehab OT Goal Formulation: With patient Time For Goal Achievement: 06/16/22 Potential to Achieve Goals: Fair  OT Frequency: Min 2X/week    Co-evaluation              AM-PAC OT "6 Clicks" Daily Activity  Outcome  Measure Help from another person eating meals?: A Little Help from another person taking care of personal grooming?: A Little Help from another person toileting, which includes using toliet, bedpan, or urinal?: A Lot Help from another person bathing (including washing, rinsing, drying)?: A Lot Help from another person to put on and taking off regular upper body clothing?: A Little Help from another person to put on and taking off regular lower body clothing?: A Lot 6 Click Score: 15   End of Session Equipment Utilized During Treatment: Gait belt;Rolling walker (2 wheels) Nurse Communication: Mobility status  Activity Tolerance: Patient limited by lethargy Patient left: in bed;with call bell/phone within reach (sitting EOB with breakfast tray)  OT Visit Diagnosis: Unsteadiness on feet (R26.81);Other abnormalities of gait and mobility (R26.89);Muscle weakness (generalized) (M62.81)                Time: 2703-5009 OT Time Calculation (min): 22 min Charges:  OT General Charges $OT Visit: 1 Visit OT Evaluation $OT Eval Moderate Complexity: 1 Mod  Pollyann Glen E. Curren Mohrmann, OTR/L Acute Rehabilitation Services [phone]   Cherlyn Cushing 06/02/2022, 9:11 AM

## 2022-06-02 NOTE — Progress Notes (Signed)
I was called to patient's room due to her female visitor being seen handing her a bottle of pills.  When asked about it he said she told him to bring it and the patient stated it was her gabapentin.  Patient was informed she could not take her home medication or have it in her room and she became belligerent.  I informed her would have to store the pills for her or contact security and she finally gave me the bottle of pills.  Upon inspection the patient name on the prescription label had been removed and the label on the bottle was for gabapentin.  The pills were placed in a secure belonging bag and sealed in front of the patient and another RN Bryon and taken to the pharmacy to be stored until the patient is discharged.  Patient made aware the pills were stored in pharmacy and informed she could collect them when she is discharged.

## 2022-06-03 DIAGNOSIS — F1410 Cocaine abuse, uncomplicated: Secondary | ICD-10-CM | POA: Diagnosis not present

## 2022-06-03 DIAGNOSIS — Z79899 Other long term (current) drug therapy: Secondary | ICD-10-CM | POA: Diagnosis not present

## 2022-06-03 DIAGNOSIS — Z794 Long term (current) use of insulin: Secondary | ICD-10-CM | POA: Diagnosis not present

## 2022-06-03 DIAGNOSIS — R4182 Altered mental status, unspecified: Secondary | ICD-10-CM | POA: Diagnosis present

## 2022-06-03 DIAGNOSIS — F17210 Nicotine dependence, cigarettes, uncomplicated: Secondary | ICD-10-CM | POA: Diagnosis not present

## 2022-06-03 DIAGNOSIS — Z8673 Personal history of transient ischemic attack (TIA), and cerebral infarction without residual deficits: Secondary | ICD-10-CM | POA: Diagnosis not present

## 2022-06-03 DIAGNOSIS — I161 Hypertensive emergency: Secondary | ICD-10-CM | POA: Diagnosis not present

## 2022-06-03 DIAGNOSIS — I674 Hypertensive encephalopathy: Secondary | ICD-10-CM | POA: Diagnosis not present

## 2022-06-03 DIAGNOSIS — Z7902 Long term (current) use of antithrombotics/antiplatelets: Secondary | ICD-10-CM | POA: Diagnosis not present

## 2022-06-03 DIAGNOSIS — E1165 Type 2 diabetes mellitus with hyperglycemia: Secondary | ICD-10-CM | POA: Diagnosis not present

## 2022-06-03 DIAGNOSIS — D509 Iron deficiency anemia, unspecified: Secondary | ICD-10-CM | POA: Diagnosis not present

## 2022-06-03 DIAGNOSIS — Z7982 Long term (current) use of aspirin: Secondary | ICD-10-CM | POA: Diagnosis not present

## 2022-06-03 DIAGNOSIS — Z91040 Latex allergy status: Secondary | ICD-10-CM | POA: Diagnosis not present

## 2022-06-03 DIAGNOSIS — G929 Unspecified toxic encephalopathy: Secondary | ICD-10-CM | POA: Diagnosis not present

## 2022-06-03 DIAGNOSIS — Z7984 Long term (current) use of oral hypoglycemic drugs: Secondary | ICD-10-CM | POA: Diagnosis not present

## 2022-06-03 DIAGNOSIS — I10 Essential (primary) hypertension: Secondary | ICD-10-CM | POA: Diagnosis not present

## 2022-06-03 LAB — GLUCOSE, CAPILLARY
Glucose-Capillary: 200 mg/dL — ABNORMAL HIGH (ref 70–99)
Glucose-Capillary: 187 mg/dL — ABNORMAL HIGH (ref 70–99)
Glucose-Capillary: 183 mg/dL — ABNORMAL HIGH (ref 70–99)
Glucose-Capillary: 216 mg/dL — ABNORMAL HIGH (ref 70–99)

## 2022-06-03 MED ORDER — BISACODYL 10 MG RE SUPP
10.0000 mg | Freq: Every day | RECTAL | Status: DC | PRN
  Administered 2022-06-03: 10 mg via RECTAL
  Filled 2022-06-03: qty 1

## 2022-06-03 NOTE — Progress Notes (Signed)
Patient refusing to take medications by herself, wanted them feed to her one at a time with applesauce, refusing purewick, refusing to go to the toilet, refusing to clean herself. States she just wants someone to wash her up and change her when she goes. Very liable mood. Refusing therapy.

## 2022-06-03 NOTE — Progress Notes (Signed)
PROGRESS NOTE   Kaitlyn Gray  ONG:[id number]    DOB: [birth year]    DOA: 05/31/2022  PCP: Patient, No Pcp Per   I have briefly reviewed patients previous medical records in [facility].  Chief Complaint  Patient presents with   Altered Mental Status    Brief Narrative:  46 year old female, lives alone, ambulates with the help of a walker, extensive past medical history including not limited to strokes with residual dysarthria, facial asymmetry and left-sided weakness, hypertension, diabetes, polysubstance abuse (severe cocaine use, cannabis, tobacco & opiate), adjustment disorder, bipolar disorder, chronic anemia, presented to the ED via EMS on 05/31/2022 due to acute onset of leg weakness and inability to walk.  Initial BP by EMS 200/110.  CT head without acute stroke.  Neurology consulted in the ED and felt that this was due to hypertensive encephalopathy and not stroke.  Briefly on Cleviprex drip, stopped on evening of admission.  Clinically has improved and stabilized.  PT recommended SNF.  Per TOC, extremely difficult to place and SNF due to her insurance and ongoing cocaine use.  Despite patient wanting to discharge, apparently ambulated only 6 feet with PT and complained of dizziness.   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  Suspect multifactorial due to recent cocaine abuse and noncompliance with meds.  Briefly on Cleviprex drip which was discontinued on the evening of admission.  Resumed prior home meds including amlodipine 10 Mg daily, Cozaar 100 Mg daily, as  needed IV hydralazine for SBP >160.  Avoid beta-blockers due to cocaine use.  Patient reports that she missed her antihypertensives on day of admission.  Blood pressure is controlled on prior home meds.   Acute toxic encephalopathy/hypertensive encephalopathy: Secondary to cocaine abuse.  Imaging negative for acute stroke.  AMS resolved.  History of CVA with residual left hemiparesis: Neurology consultation appreciated.  Do not feel that she has new stroke.  Imaging findings as noted above.  Continue aspirin 81 Mg daily, Plavix 75 Mg daily and statins.  Tolerating diet.  PT and OT recommend SNF but per discussion with TOC, unable to place in SNF due to reasons noted above.  Patient has been medically optimized for DC pending safe disposition.  May need to continue to work with therapy until she is stable enough to return home by herself versus getting an SNF bed.   Type II DM with hyperglycemia: A1c 6.6 in October 2023.  Hold oral meds.  SSI and adjust as needed.  Good inpatient control.  Unclear if patient taking oral hypoglycemics or not.  She is a poor historian.  Patient insists on changing to regular diet.  CBGs have mildly worsened.  No change in regimen.   Essential hypertension: Management as noted above.   Polysubstance abuse (cocaine, cannabis, tobacco and opiates): Volunteers to last using cocaine on 12/13.  Cessation counseled.  Warned her that continued substance abuse puts her at grave danger for recurrent complications and even death.   Bipolar disorder: Although pharmacy home med rec indicates that she was not taking Seroquel, she insist that she is taking it and  wants it started.  Continue Seroquel.   GERD: Continue PPI   Chronic microcytic anemia: Stable.   There is no height or weight on file to calculate BMI.   DVT prophylaxis: enoxaparin (LOVENOX) injection 40 mg Start: 06/01/22 1017  Lovenox   Code Status: Full Code:  Family Communication: None at  bedside Disposition:  Despite care having crossed 2 midnights, no medical intensity to recommend inpatient change.  She has been medically ready for discharge since yesterday, awaiting safe disposition.  Discussed with [facility] 12/19     Consultants:   Neurology PCCM  Procedures:     Antimicrobials:      Subjective:  Seen this morning.  Denied complaints.  Wanted to use the bathroom.  Subsequently RN reported that at any event, she wanted to be discharged today either to SNF or home.  As per discussion with TOC, no SNF options.  Also did not do well with PT as noted above. Objective:   Vitals:   06/02/22 2000 06/03/22 0000 06/03/22 0737 06/03/22 1124  BP: (!) 160/87 (!) 152/86 (!) 163/77 138/82  Pulse: 84 78 84 83  Resp: 20 20 20 17   Temp: 98.4 F (36.9 C) 98.3 F (36.8 C) 98.9 F (37.2 C) (!) 97.5 F (36.4 C)  TempSrc: Oral Oral Oral Oral  SpO2: 100% 100% 99% 100%    General exam: Young female, moderately built and nourished sitting up comfortably at edge of bed independently Respiratory system: Clear to auscultation.  No increased work of breathing. Cardiovascular system: S1 and S2 heard, RRR.  No JVD, murmurs or pedal edema.  Has been off of telemetry for 2 days. Gastrointestinal system: Abdomen is nondistended, soft and nontender. No organomegaly or masses felt. Normal bowel sounds heard. Central nervous system: Alert and oriented x 3.  Minimal dysarthria.  Mildly diminished right nasolabial fold.  No other focal neurological deficits. Extremities: Right side grade 4+ by 5 power.  Left upper extremities proximally 2 x 5 power, distally 4 x 5 power.  Left lower extremity grade 2 x 5 power.  Strongly suspect the patient does not provide full effort. Skin: No rashes, lesions or ulcers Psychiatry: Judgement and insight appear impaired. Mood & affect appropriate.     Data Reviewed:   I have personally reviewed following labs and imaging studies   CBC: Recent Labs  Lab  2022-06-02 0900 06/02/2022 0920 06/01/22 1650  WBC 6.5  --  4.1  NEUTROABS 5.1  --   --   HGB 9.7* 11.2* 9.5*  HCT 32.4* 33.0* 30.7*  MCV 79.2*  --  77.1*  PLT 256  --  245    Basic Metabolic Panel: Recent Labs  Lab 02-Jun-2022 0900 06/02/22 0920 06/01/22 1650  NA 137 138 139  K 4.3 4.2 3.4*  CL 105 106 108  CO2 23  --  25  GLUCOSE 153* 154* 124*  BUN 13 15 22*  CREATININE 0.76 0.70 0.87  CALCIUM 8.9  --  8.9    Liver Function Tests: Recent Labs  Lab 06-02-22 0900  AST 20  ALT 11  ALKPHOS 63  BILITOT 0.3  PROT 7.1  ALBUMIN 3.7    CBG: Recent Labs  Lab 06/02/22 2109 06/03/22 0645 06/03/22 1122  GLUCAP 156* 200* 187*    Microbiology Studies:  No results found for this or any previous visit (from the past 240 hour(s)).  Radiology Studies:  No results found.  Scheduled Meds:    amLODipine  10 mg Oral Daily  aspirin EC  81 mg Oral Daily   clopidogrel  75 mg Oral Daily   enoxaparin (LOVENOX) injection  40 mg Subcutaneous Q24H   ferrous sulfate  325 mg Oral QODAY   insulin aspart  0-5 Units Subcutaneous QHS   insulin aspart  0-9 Units Subcutaneous TID WC   losartan  100 mg Oral Daily   pantoprazole  40 mg Oral Daily   polyethylene glycol  17 g Oral Daily   QUEtiapine  200 mg Oral QHS   rosuvastatin  40 mg Oral Daily   senna-docusate  2 tablet Oral Daily   sodium chloride flush  3 mL Intravenous Q12H    Continuous Infusions:    sodium chloride       LOS: 0 days     Marcellus Scott, MD,  FACP, FHM, SFHM, [facility], [facility]   [facility] [place]     To contact the attending provider between 7A-7P or the covering provider during after hours 7P-7A, please log into the web site [email or web address] and access using universal [place] password for that web site. If you do not have the password, please call the hospital operator.  06/03/2022, 12:19 PM

## 2022-06-03 NOTE — TOC Progression Note (Signed)
Transition of Care [facility]) - Progression Note    Patient Details  Name: Kaitlyn Gray MRN: [id number] Date of Birth: 1976/02/17  [facility] CM/SW Contact  Tory Emerald, [place] Phone Number: 06/03/2022, 8:47 AM  Clinical Narrative:     CSW conversed with patient regarding barriers to placement at [facility]. CSW discussed SU concerns and Insurance barriers. CSW discussed potentially going to [facility] or back home. Patient reports she will go back home and further explained she wants to quit using substances and the hospital allows her to get away from negative influences and gather herself before returning home. CSW assessed motivation, which appears pt is in  pre-contemplation stage of change model. CSW asked if she would like clinical resources for community treatment regarding MI/SU; she agreed. CSW will provide information for [facility] due to pt not having insurance.   Expected Discharge Plan: Skilled Nursing Facility Barriers to Discharge: SNF Pending bed offer  Expected Discharge Plan and Services Expected Discharge Plan: Skilled Nursing Facility       Living arrangements for the past 2 months: Single Family Home                                       Social Determinants of Health (SDOH) Interventions    Readmission Risk Interventions    05/02/2022    1:55 PM 04/09/2022   11:37 AM  Readmission Risk Prevention Plan  Transportation Screening Complete Complete  Medication Review [employer]) Complete Complete  PCP or Specialist appointment within 3-5 days of discharge Complete Complete  HRI or Home Care Consult Complete Complete  SW Recovery Care/Counseling Consult Complete   Palliative Care Screening Not Applicable Not Applicable  Skilled Nursing Facility Complete Complete

## 2022-06-03 NOTE — Progress Notes (Signed)
Physical Therapy Treatment Patient Details Name: Kaitlyn Gray MRN: [id number] DOB: September 10, 1975 Today's Date: 06/03/2022   History of Present Illness Pt is a 46 y.o. F who presents 05/31/2022 with acute onset of leg weakness and inability to walk. Initial BP by EMS 200/110. CT head without acute stroke. Neurology consulted in ED and felt this was due to hypertensive encephalopathy and not stroke. Significant PMH: strokes with residual dysarthria, facial asymmetry    PT Comments    Patient seated EOB with Mobility Specialist on arrival. Agrees to ambulate. Only able to walk 6 ft with RW with minguard assist due to reported incr dizziness and needing to sit. Chair brought up behind her, returned to sitting and took BP=154/72. Pt then began to complain of blurry vision. Allowed seated rest break with pt reporting she could not walk again due to symptoms. Turned chair around and pt agreed to walk 6 ft back to bed (again with RW and minguard assist). Patient stating she wants MD to discharge her and confirmed that she has 6 steps to enter her home. She reports she'll try to get a friend to help her up steps using RW. Then states she'll crawl and even fall if she has to once she is home, she just wants to go home. Currently not demonstrating ability to safely climb steps, however notified by [facility] team that pt cannot go to SNF due to recent drug use (present on labs on admission).  Discharge plan updated to home health, however she is not yet ready to discharge from a mobility perspective. Will plan to see her a minimum of 4x/week (in addition to Mobility Specialist attempts) to try to prepare her for discharge home.    Recommendations for follow up therapy are one component of a multi-disciplinary discharge planning process, led by the attending physician.  Recommendations may be updated based on patient status, additional functional criteria and insurance authorization.  Follow Up Recommendations  Home  health PT (cannot go to SNF due to drug use; not yet moving well enough to go home with HHPT) Can patient physically be transported by private vehicle: No   Assistance Recommended at Discharge Frequent or constant Supervision/Assistance  Patient can return home with the following A lot of help with walking and/or transfers;A lot of help with bathing/dressing/bathroom;Assistance with cooking/housework;Direct supervision/assist for medications management;Direct supervision/assist for financial management;Assist for transportation;Help with stairs or ramp for entrance   Equipment Recommendations  Other (comment) (?medical transport to assist her into apartment (has 6 steps to enter))    Recommendations for Other Services       Precautions / Restrictions Precautions Precautions: Fall Restrictions Weight Bearing Restrictions: No     Mobility  Bed Mobility               General bed mobility comments: pt sitting EOB on arrival    Transfers Overall transfer level: Needs assistance Equipment used: Rolling walker (2 wheels) Transfers: Sit to/from Stand Sit to Stand: Min guard           General transfer comment: vc for safe hand placement with RW; Min gaurd to stand, increased sway in standing; on second transfer reports dizziness (BP assessed once seated and 154/72)    Ambulation/Gait Ambulation/Gait assistance: Min guard Gait Distance (Feet): 6 Feet (seated break due to dizziness; 6 ft back to bed) Assistive device: Rolling walker (2 wheels) Gait Pattern/deviations: Step-through pattern, Decreased stride length, Shuffle, Wide base of support       General Gait  Details: pt with shuffling gait with incr lateral sway; reported dizziness after 6 ft with blurry vision and chair brought to pt to sit down   Stairs Stairs:  (Discussed pt has 6 steps to enter her home and she usually carries RW up/down with her (not her rollator); thinks she has a friend who will help her get in  the house)           Wheelchair Mobility    Modified Rankin (Stroke Patients Only)       Balance Overall balance assessment: Needs assistance Sitting-balance support: Feet supported Sitting balance-Leahy Scale: Fair     Standing balance support: Bilateral upper extremity supported Standing balance-Leahy Scale: Poor Standing balance comment: reliant on RW                            Cognition Arousal/Alertness: Awake/alert Behavior During Therapy: Impulsive Overall Cognitive Status: No family/caregiver present to determine baseline cognitive functioning Area of Impairment: Safety/judgement, Awareness, Problem solving                         Safety/Judgement: Decreased awareness of deficits, Decreased awareness of safety Awareness: Emergent Problem Solving: Difficulty sequencing, Requires verbal cues, Slow processing, Decreased initiation General Comments: easily agitated and becomes impulsive; stating she will go home today and crawl around and fall but she's leaving today        Exercises      General Comments General comments (skin integrity, edema, etc.): Pt became agitated after asked PT to dial number for her to call her friend and see if he can help her get in the house. PT dialed number and handed her phone and she hung up and stated she couldn't talk because his mother might answer and she doesn't like patient      Pertinent Vitals/Pain Pain Assessment Pain Assessment: No/denies pain    Home Living                          Prior Function            PT Goals (current goals can now be found in the care plan section) Acute Rehab PT Goals Patient Stated Goal: get more help at home Time For Goal Achievement: 06/15/22 Potential to Achieve Goals: Good Progress towards PT goals: Progressing toward goals    Frequency    Min 4X/week      PT Plan Discharge plan needs to be updated    Co-evaluation               AM-PAC PT "6 Clicks" Mobility   Outcome Measure  Help needed turning from your back to your side while in a flat bed without using bedrails?: A Little Help needed moving from lying on your back to sitting on the side of a flat bed without using bedrails?: A Little Help needed moving to and from a bed to a chair (including a wheelchair)?: A Little Help needed standing up from a chair using your arms (e.g., wheelchair or bedside chair)?: A Little Help needed to walk in hospital room?: Total Help needed climbing 3-5 steps with a railing? : Total 6 Click Score: 14    End of Session Equipment Utilized During Treatment: Gait belt Activity Tolerance: Treatment limited secondary to medical complications (Comment) (limited by dizziness and blurry vision) Patient left: in bed;with call bell/phone within reach;with bed alarm set  Nurse Communication: Mobility status PT Visit Diagnosis: Unsteadiness on feet (R26.81);Difficulty in walking, not elsewhere classified (R26.2);History of falling (Z91.81)     Time: 7510-2585 PT Time Calculation (min) (ACUTE ONLY): 18 min  Charges:  $Gait Training: 8-22 mins                      Jerolyn Center, PT Acute Rehabilitation Services  Office [phone]    Zena Amos 06/03/2022, 12:24 PM

## 2022-06-04 ENCOUNTER — Ambulatory Visit: Payer: Medicaid Other | Admitting: Family Medicine

## 2022-06-04 ENCOUNTER — Other Ambulatory Visit (HOSPITAL_COMMUNITY): Payer: Self-pay

## 2022-06-04 DIAGNOSIS — I161 Hypertensive emergency: Secondary | ICD-10-CM | POA: Diagnosis not present

## 2022-06-04 LAB — GLUCOSE, CAPILLARY: Glucose-Capillary: 151 mg/dL — ABNORMAL HIGH (ref 70–99)

## 2022-06-04 MED ORDER — CLOPIDOGREL BISULFATE 75 MG PO TABS
75.0000 mg | ORAL_TABLET | Freq: Every day | ORAL | 2 refills | Status: AC
  Filled 2022-06-04 – 2022-06-22 (×2): qty 30, 30d supply, fill #0

## 2022-06-04 MED ORDER — QUETIAPINE FUMARATE 200 MG PO TABS
200.0000 mg | ORAL_TABLET | Freq: Every day | ORAL | 2 refills | Status: DC
  Filled 2022-06-04 – 2022-06-22 (×2): qty 30, 30d supply, fill #0
  Filled 2022-09-14: qty 30, 30d supply, fill #1
  Filled 2022-10-10: qty 30, 30d supply, fill #2

## 2022-06-04 MED ORDER — PANTOPRAZOLE SODIUM 40 MG PO TBEC
40.0000 mg | DELAYED_RELEASE_TABLET | Freq: Every day | ORAL | 2 refills | Status: DC
  Filled 2022-06-04 – 2022-06-22 (×2): qty 30, 30d supply, fill #0
  Filled 2022-09-14: qty 30, 30d supply, fill #1
  Filled 2022-10-10: qty 30, 30d supply, fill #2

## 2022-06-04 MED ORDER — METFORMIN HCL 500 MG PO TABS
500.0000 mg | ORAL_TABLET | Freq: Two times a day (BID) | ORAL | 2 refills | Status: DC
  Filled 2022-06-04 – 2022-06-22 (×2): qty 60, 30d supply, fill #0
  Filled 2022-09-14: qty 60, 30d supply, fill #1
  Filled 2022-10-10: qty 60, 30d supply, fill #2

## 2022-06-04 MED ORDER — AMLODIPINE BESYLATE 10 MG PO TABS
10.0000 mg | ORAL_TABLET | Freq: Every day | ORAL | 2 refills | Status: DC
  Filled 2022-06-04 – 2022-06-22 (×2): qty 30, 30d supply, fill #0
  Filled 2022-09-14: qty 30, 30d supply, fill #1
  Filled 2022-10-10: qty 30, 30d supply, fill #2

## 2022-06-04 MED ORDER — GABAPENTIN 300 MG PO CAPS
300.0000 mg | ORAL_CAPSULE | Freq: Every day | ORAL | 2 refills | Status: DC
  Filled 2022-06-04 – 2022-06-22 (×2): qty 30, 30d supply, fill #0
  Filled 2022-09-14: qty 30, 30d supply, fill #1
  Filled 2022-10-10: qty 30, 30d supply, fill #2

## 2022-06-04 MED ORDER — ASPIRIN 81 MG PO TBEC
81.0000 mg | DELAYED_RELEASE_TABLET | Freq: Every day | ORAL | 2 refills | Status: AC
  Filled 2022-06-04 – 2022-06-22 (×2): qty 30, 30d supply, fill #0

## 2022-06-04 MED ORDER — FERROUS SULFATE 325 (65 FE) MG PO TABS
325.0000 mg | ORAL_TABLET | ORAL | 2 refills | Status: DC
  Filled 2022-06-04 – 2022-10-10 (×3): qty 15, 30d supply, fill #0

## 2022-06-04 MED ORDER — ROSUVASTATIN CALCIUM 40 MG PO TABS
40.0000 mg | ORAL_TABLET | Freq: Every day | ORAL | 2 refills | Status: DC
  Filled 2022-06-04 – 2022-06-22 (×2): qty 30, 30d supply, fill #0
  Filled 2022-09-14: qty 30, 30d supply, fill #1
  Filled 2022-10-10: qty 30, 30d supply, fill #2

## 2022-06-04 MED ORDER — LOSARTAN POTASSIUM 100 MG PO TABS
100.0000 mg | ORAL_TABLET | Freq: Every day | ORAL | 2 refills | Status: DC
  Filled 2022-06-04 – 2022-06-22 (×2): qty 30, 30d supply, fill #0
  Filled 2022-09-14: qty 30, 30d supply, fill #1
  Filled 2022-10-10: qty 30, 30d supply, fill #2

## 2022-06-04 NOTE — TOC Transition Note (Signed)
Transition of Care [facility]) - CM/SW Discharge Note   Patient Details  Name: Kaitlyn Gray MRN: [id number] Date of Birth: 01/08/1976  Transition of Care [facility] CM/SW Contact:  Kermit Balo, RN Phone Number: 06/04/2022, 3:24 PM   Clinical Narrative:    Pt discharging home with self care. CM unable to arrange [facility] services as pt has family planning Medicaid and + for cocaine on admission. Pt asking for walker for home. Cm sent referral to [facility].  Pts home medications to be delivered to the room per [facility] pharmacy.  CM provided bedside RN with cab voucher for transport home.  Pt left the hospital before receiving d/c medications or walker per bedside RN.   Final next level of care: Home/Self Care Barriers to Discharge: No Barriers Identified   Patient Goals and CMS Choice Patient states their goals for this hospitalization and ongoing recovery are:: Pt reports she wants to get stronger and does not want to go home without help "this time". CMS Medicare.gov Compare Post Acute Care list provided to:: Patient Choice offered to / list presented to : Patient    Discharge Placement                       Discharge Plan and Services                                     Social Determinants of Health (SDOH) Interventions     Readmission Risk Interventions    05/02/2022    1:55 PM 04/09/2022   11:37 AM  Readmission Risk Prevention Plan  Transportation Screening Complete Complete  Medication Review [employer]) Complete Complete  PCP or Specialist appointment within 3-5 days of discharge Complete Complete  HRI or Home Care Consult Complete Complete  SW Recovery Care/Counseling Consult Complete   Palliative Care Screening Not Applicable Not Applicable  Skilled Nursing Facility Complete Complete

## 2022-06-04 NOTE — Discharge Summary (Signed)
Physician Discharge Summary  IRA BUSBIN NTI:[id number] DOB: 10/08/1975 DOA: 05/31/2022  PCP: Patient, No Pcp Per  Admit date: 05/31/2022 Discharge date: 06/04/2022  Admitted From: Home Disposition: Home  Recommendations for Outpatient Follow-up:  Follow up with PCP in 1-2 weeks Continue to encourage adherence to medication regimen and continued counseling on need for complete substance abuse cessation  Home Health: No Equipment/Devices: None  Discharge Condition: Stable CODE STATUS: Full code Diet recommendation: Heart healthy/consistent carbohydrate diet  History of present illness:  Kaitlyn Gray is a 46 year old female with 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  Initial BP by EMS 200/110.  CT head without acute stroke.  Neurology consulted in the ED and felt that this was due to hypertensive encephalopathy and not stroke.  Briefly on Cleviprex drip, stopped on evening of admission.  Clinically has improved and stabilized.  PT recommended SNF.  Per TOC, extremely difficult to place and SNF due to her insurance and ongoing cocaine use.   Hospital course:  Hypertensive emergency: Initial BP 200/110 by EMS with strokelike symptoms and altered mental status. Suspect multifactorial due to recent cocaine abuse and noncompliance with meds. Briefly on Cleviprex drip which was discontinued on the evening of admission. Resumed prior home meds including amlodipine 10 Mg daily, Cozaar 100 Mg daily. Avoid beta-blockers due to cocaine use.  Patient reports that she missed her antihypertensives on day of admission.  Blood pressure is controlled on prior home meds.   Acute toxic  encephalopathy/hypertensive encephalopathy: Secondary to cocaine abuse.  Imaging negative for acute stroke.  AMS resolved.   History of CVA with residual left hemiparesis: Neurology consultation appreciated.  Do not feel that she has new stroke.  Imaging findings as noted above.  Continue aspirin 81 Mg daily, Plavix 75 Mg daily and statins.  Tolerating diet.  PT and OT recommend SNF but per discussion with TOC, unable to place in SNF due to reasons noted above.  Patient has been medically optimized and discharging home.    Type II DM with hyperglycemia: Hemoglobin A1c 6.6 in October 2023.  Continue metformin 500 mg p.o. twice daily.    Essential hypertension: Amlodipine 10 mg p.o. daily, Cozaar 100 mg p.o. daily.   Polysubstance abuse (cocaine, cannabis, tobacco and opiates): Volunteers to last using cocaine on 12/13.  Cessation counseled.  Warned her that continued substance abuse puts her at grave danger for recurrent complications and even death.   Bipolar disorder: Continue Seroquel.   GERD: Continue PPI   Chronic microcytic anemia: Stable.  Continue ferrous sulfate.  Patient is high risk for readmission given her history of substance abuse, medical neglect.  Discharge Diagnoses:  Principal Problem:   Hypertensive emergency Active Problems:   Polysubstance abuse ([place])   Type 2 diabetes mellitus (HCC)   Hypertension associated with diabetes ([place])   Cocaine use disorder, severe, dependence ([place])   History of stroke   Hemiparesis affecting left side as late effect of cerebrovascular accident (CVA) [facility])    Discharge Instructions  Discharge Instructions     Call MD for:  difficulty breathing, headache or visual disturbances   Complete by: As directed    Call MD for:  extreme fatigue   Complete by: As directed    Call  MD for:  persistant dizziness or light-headedness   Complete by: As directed    Call MD for:  persistant nausea and vomiting   Complete by: As directed     Call MD for:  severe uncontrolled pain   Complete by: As directed    Call MD for:  temperature >100.4   Complete by: As directed    Diet - low sodium heart healthy   Complete by: As directed    Increase activity slowly   Complete by: As directed       Allergies as of 06/04/2022       Reactions   Hydrocodone Itching   Latex Itching, Rash        Medication List     STOP taking these medications    blood glucose meter kit and supplies   glipiZIDE 5 MG tablet Commonly known as: GLUCOTROL   Insulin Pen Needle 31G X 5 MM Misc   metFORMIN 500 MG 24 hr tablet Commonly known as: GLUCOPHAGE-XR Replaced by: metFORMIN 500 MG tablet       TAKE these medications    amLODipine 10 MG tablet Commonly known as: NORVASC Take 1 tablet (10 mg total) by mouth daily.   aspirin EC 81 MG tablet Take 1 tablet (81 mg total) by mouth daily. Swallow whole. Start taking on: June 05, 2022   clopidogrel 75 MG tablet Commonly known as: PLAVIX Take 1 tablet (75 mg total) by mouth daily.   ferrous sulfate 325 (65 FE) MG tablet Take 1 tablet (325 mg total) by mouth every other day.   gabapentin 300 MG capsule Commonly known as: Neurontin Take 1 capsule (300 mg total) by mouth at bedtime.   losartan 100 MG tablet Commonly known as: COZAAR Take 1 tablet (100 mg total) by mouth daily.   metFORMIN 500 MG tablet Commonly known as: GLUCOPHAGE Take 1 tablet (500 mg total) by mouth 2 (two) times daily with a meal. Replaces: metFORMIN 500 MG 24 hr tablet   pantoprazole 40 MG tablet Commonly known as: PROTONIX Take 1 tablet (40 mg total) by mouth daily.   QUEtiapine 200 MG tablet Commonly known as: SEROQUEL Take 1 tablet (200 mg total) by mouth at bedtime.   rosuvastatin 40 MG tablet Commonly known as: CRESTOR Take 1 tablet (40 mg total) by mouth daily.        Allergies  Allergen Reactions   Hydrocodone Itching   Latex Itching and Rash     Consultations: Neurology   Procedures/Studies: CT ANGIO HEAD NECK W WO CM W PERF (CODE STROKE)  Result Date: 05/31/2022 CLINICAL DATA:  46 year old female code stroke EXAM: CT ANGIOGRAPHY HEAD AND NECK CT PERFUSION BRAIN TECHNIQUE: Multidetector CT imaging of the head and neck was performed using the standard protocol during bolus administration of intravenous contrast. Multiplanar CT image reconstructions and MIPs were obtained to evaluate the vascular anatomy. Carotid stenosis measurements (when applicable) are obtained utilizing NASCET criteria, using the distal internal carotid diameter as the denominator. Multiphase CT imaging of the brain was performed following IV bolus contrast injection. Subsequent parametric perfusion maps were calculated using RAPID software. RADIATION DOSE REDUCTION: This exam was performed according to the departmental dose-optimization program which includes automated exposure control, adjustment of the mA and/or kV according to patient size and/or use of iterative reconstruction technique. CONTRAST:  15m OMNIPAQUE IOHEXOL 350 MG/ML SOLN COMPARISON:  Plain head CT 0929 hours today. Brain MRI 05/01/2022 and earlier, including CTA head and neck 03/30/2022. FINDINGS: CT Brain  Perfusion Findings: ASPECTS: 10 CBF (<30%) Volume: 46m Perfusion (Tmax>6.0s) volume: 057mMismatch Volume: Not applicable Infarction Location:Not applicable CTA NECK Skeleton: Stable visualized osseous structures. Upper chest: Stable, negative. Other neck: No acute neck soft tissue finding. Aortic arch: Stable 3 vessel arch configuration. Mild arch tortuosity, no arch atherosclerosis. Right carotid system: Stable including retropharyngeal course and segmental stenosis at the right ICA bulb, numerically estimated at 70% over a 3 mm segment. See series 9, image 24. Tortuosity distal to that stenosis again noted. Left carotid system: Stable tortuosity and retropharyngeal course without stenosis. Vertebral  arteries: Proximal right subclavian artery and right vertebral artery origin remain within normal limits. Right vertebral artery remains patent to the skull base with no significant plaque or stenosis. Proximal left subclavian artery and left vertebral artery origin remain negative. Non dominant left vertebral artery with tortuous V1 segment. Left vertebral remains patent to the skull base with no significant plaque or stenosis identified. CTA HEAD Posterior circulation: Patent distal vertebral arteries and vertebrobasilar junction with mild irregularity and stenosis. PICA origins appear patent. Patent basilar artery with mild irregularity, no significant basilar stenosis. Patent SCA and PCA origins. Posterior communicating arteries are diminutive or absent. Left P1 and P2 segments remain patent without significant stenosis. Up to moderate multifocal P3 segment irregularity now on series 11, image 24. Stable chronic irregularity and stenosis of the right P2 segment with attenuated right PCA branches. Anterior circulation: Both ICA siphons remain patent. On the right there is mild to moderate cavernous segment plaque with no significant stenosis. But on the left there is moderate stenosis at the distal cavernous segment on series 10, image 116 which appears atherosclerotic and stable since October. No supraclinoid segment stenosis and both carotid termini remain patent. Patent MCA and ACA origins. Mild A1 segment irregularity is stable. Normal anterior communicating artery. A2 segment irregularity, stenosis, and occlusion. And the more dominant appearing distal right ACA is also occluded in the A3 segment with some reconstitution (series 11, image 21). This appearance is mildly progressed since October. Left MCA M1 segment and bifurcation are patent without stenosis. Left MCA branches are mildly irregular, most pronounced in posterior left M3 branches on series 11, image 28 and not significantly changed. Right MCA M1  segment is mildly to moderately irregular and stenotic on series 9, image 25 which is stable. Right MCA bifurcation remains patent. Right MCA branches appear stable within irregular and truncated M3 branch appearance somewhat diffusely (series 11, image 14). Venous sinuses: Early contrast timing, not well evaluated. Anatomic variants: Mildly dominant right vertebral artery. Affectively dominant right ACA. Review of the MIP images confirms the above findings IMPRESSION: 1. Negative for emergent large vessel occlusion, and negative CT Perfusion. 2. 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 - Chronically attenuated Right PCA. - Moderate stenosis Left ICA cavernous segment. - Moderate stenosis Right MCA M1 - Moderate bilateral MCA M3 and Left PCA P3 branch stenoses. Salient findings were communicated to Dr. LiCheral Markert 10:13 am on 05/31/2022 by text page via the [facility]aging system. Electronically Signed   By: H Genevie Ann.D.   On: 05/31/2022 10:14   CT HEAD CODE STROKE WO CONTRAST  Result Date: 05/31/2022 CLINICAL DATA:  Code stroke. [age]ear old female. History of previous cerebral infarcts, including left perirolandic infarction in October this year. EXAM: CT HEAD WITHOUT CONTRAST TECHNIQUE: Contiguous axial images were  obtained from the base of the skull through the vertex without intravenous contrast. RADIATION DOSE REDUCTION: This exam was performed according to the departmental dose-optimization program which includes automated exposure control, adjustment of the mA and/or kV according to patient size and/or use of iterative reconstruction technique. COMPARISON:  Brain MRI 05/01/2022 and earlier. FINDINGS: Brain: Superior left perirolandic developing encephalomalacia appears stable from last month. Contralateral small chronic right centrum semiovale infarcts  better demonstrated by MRI. Chronic right occipital pole encephalomalacia. Chronic right cerebellar encephalomalacia appears progressed by CT since October (series 5, image 43) although seems not significantly changed from previous MRIs and associated with some chronic brainstem and cerebellar peduncle Wallerian degeneration there. Stable cerebral volume. No midline shift, ventriculomegaly, mass effect, evidence of mass lesion, intracranial hemorrhage or evidence of cortically based acute infarction. Vascular: Mild Calcified atherosclerosis at the skull base. No suspicious intracranial vascular hyperdensity. Skull: Chronic right lamina papyracea fracture. No acute osseous abnormality identified. Sinuses/Orbits: Chronic right lamina papyracea fracture and scattered generally mild sinus disease is stable. Tympanic cavities and mastoids remain aerated. Other: Similar leftward gaze. No acute orbit or scalp soft tissue finding. ASPECTS [facility] Stroke Program Early CT Score) Total score (0-10 with 10 being normal): 10 IMPRESSION: 1. No acute intracranial hemorrhage or cortically based infarction identified. ASPECTS 10. 2. Chronic ischemia, most pronounced in the posterior left MCA and right PCA territories. Right cerebellar encephalomalacia/Wallerian degeneration more apparent by CT but seems stable from previous MRIs. 3. These results were communicated to Dr. Cheral Marker at 9:38 am on 05/31/2022 by text page via the [facility] messaging system. Electronically Signed   By: Genevie Ann M.D.   On: 05/31/2022 09:39     Subjective: Patient seen examined at bedside, resting comfortably.  Lying in bed.  No specific complaints this morning and wishes to discharge home.  Discussed need for complete cessation from illicit substances and adherence to her medication regimen as this is likely etiology leading to her hospitalization.  No other specific questions or concerns at this time.  Denies headache, no dizziness, no chest pain, no  palpitations, no shortness of breath, no abdominal pain, no fever/chills/night sweats, no nausea/vomiting/diarrhea, no paresthesias.  No acute events overnight per nursing staff.  Discharge Exam: Vitals:   06/04/22 0405 06/04/22 0813  BP: (!) 149/73 (!) 141/75  Pulse: 75 79  Resp: 16 16  Temp: 98.5 F (36.9 C) 98.1 F (36.7 C)  SpO2: 99% 100%   Vitals:   06/03/22 2013 06/04/22 0008 06/04/22 0405 06/04/22 0813  BP: (!) 162/81 (!) 159/68 (!) 149/73 (!) 141/75  Pulse: 81 88 75 79  Resp: _0 Temp: 98 F (36.7 C) 98.6 F (37 C) 98.5 F (36.9 C) 98.1 F (36.7 C)  TempSrc: Oral Axillary Axillary Oral  SpO2: 100% 100% 99% 100%  Weight:   85.8 kg     Physical Exam: GEN: NAD, alert and oriented x 3, chronically ill in appearance, appears older than stated age HEENT: NCAT, PERRL, EOMI, sclera clear, MMM PULM: CTAB w/o wheezes/crackles, normal respiratory effort, on room air CV: RRR w/o M/G/R GI: abd soft, NTND, NABS, no R/G/M MSK: no peripheral edema, muscle strength globally intact 5/5 bilateral upper/lower extremities PSYCH: normal mood/affect; poor judgment/insight Integumentary: dry/intact, no rashes or wounds    The results of significant diagnostics from this hospitalization (including imaging, microbiology, ancillary and laboratory) are listed below for reference.     Microbiology: No results found for this or any previous visit (from the  past 240 hour(s)).   Labs: BNP (last 3 results) No results for input(s): "BNP" in the last 8760 hours. Basic Metabolic Panel: Recent Labs  Lab 05/31/22 0900 05/31/22 0920 06/01/22 1650  NA 137 138 139  K 4.3 4.2 3.4*  CL 105 106 108  CO2 23  --  25  GLUCOSE 153* 154* 124*  BUN 13 15 22*  CREATININE 0.76 0.70 0.87  CALCIUM 8.9  --  8.9   Liver Function Tests: Recent Labs  Lab 05/31/22 0900  AST 20  ALT 11  ALKPHOS 63  BILITOT 0.3  PROT 7.1  ALBUMIN 3.7   No results for input(s): "LIPASE", "AMYLASE" in  the last 168 hours. No results for input(s): "AMMONIA" in the last 168 hours. CBC: Recent Labs  Lab 05/31/22 0900 05/31/22 0920 06/01/22 1650  WBC 6.5  --  4.1  NEUTROABS 5.1  --   --   HGB 9.7* 11.2* 9.5*  HCT 32.4* 33.0* 30.7*  MCV 79.2*  --  77.1*  PLT 256  --  245   Cardiac Enzymes: No results for input(s): "CKTOTAL", "CKMB", "CKMBINDEX", "TROPONINI" in the last 168 hours. BNP: Invalid input(s): "POCBNP" CBG: Recent Labs  Lab 06/03/22 0645 06/03/22 1122 06/03/22 1655 06/03/22 2125 06/04/22 0606  GLUCAP 200* 187* 183* 216* 151*   D-Dimer No results for input(s): "DDIMER" in the last 72 hours. Hgb A1c No results for input(s): "HGBA1C" in the last 72 hours. Lipid Profile No results for input(s): "CHOL", "HDL", "LDLCALC", "TRIG", "CHOLHDL", "LDLDIRECT" in the last 72 hours. Thyroid function studies No results for input(s): "TSH", "T4TOTAL", "T3FREE", "THYROIDAB" in the last 72 hours.  Invalid input(s): "FREET3" Anemia work up No results for input(s): "VITAMINB12", "FOLATE", "FERRITIN", "TIBC", "IRON", "RETICCTPCT" in the last 72 hours. Urinalysis    Component Value Date/Time   COLORURINE COLORLESS (A) 05/31/2022 0919   APPEARANCEUR CLEAR 05/31/2022 0919   LABSPEC 1.027 05/31/2022 0919   PHURINE 8.0 05/31/2022 0919   GLUCOSEU NEGATIVE 05/31/2022 0919   HGBUR NEGATIVE 05/31/2022 0919   BILIRUBINUR NEGATIVE 05/31/2022 0919   KETONESUR NEGATIVE 05/31/2022 0919   PROTEINUR NEGATIVE 05/31/2022 0919   UROBILINOGEN 0.2 10/06/2014 0856   NITRITE NEGATIVE 05/31/2022 0919   LEUKOCYTESUR NEGATIVE 05/31/2022 0919   Sepsis Labs Recent Labs  Lab 05/31/22 0900 06/01/22 1650  WBC 6.5 4.1   Microbiology No results found for this or any previous visit (from the past 240 hour(s)).   Time coordinating discharge: Over 30 minutes  SIGNED:   Donnamarie Poag [place] DO  [facility] 06/04/2022, 10:30 AM

## 2022-06-06 ENCOUNTER — Other Ambulatory Visit (HOSPITAL_COMMUNITY): Payer: Self-pay

## 2022-06-23 ENCOUNTER — Other Ambulatory Visit: Payer: Self-pay

## 2022-06-23 ENCOUNTER — Other Ambulatory Visit (HOSPITAL_COMMUNITY): Payer: Self-pay

## 2022-06-25 ENCOUNTER — Ambulatory Visit (INDEPENDENT_AMBULATORY_CARE_PROVIDER_SITE_OTHER): Payer: Medicaid Other | Admitting: Neurology

## 2022-06-25 ENCOUNTER — Telehealth: Payer: Self-pay | Admitting: Neurology

## 2022-06-25 ENCOUNTER — Other Ambulatory Visit: Payer: Self-pay | Admitting: Neurology

## 2022-06-25 ENCOUNTER — Encounter: Payer: Self-pay | Admitting: Neurology

## 2022-06-25 VITALS — BP 168/96 | HR 63 | Ht 64.0 in | Wt 189.0 lb

## 2022-06-25 DIAGNOSIS — J383 Other diseases of vocal cords: Secondary | ICD-10-CM | POA: Diagnosis not present

## 2022-06-25 DIAGNOSIS — I6300 Cerebral infarction due to thrombosis of unspecified precerebral artery: Secondary | ICD-10-CM

## 2022-06-25 DIAGNOSIS — G8114 Spastic hemiplegia affecting left nondominant side: Secondary | ICD-10-CM | POA: Diagnosis not present

## 2022-06-25 DIAGNOSIS — Z8673 Personal history of transient ischemic attack (TIA), and cerebral infarction without residual deficits: Secondary | ICD-10-CM

## 2022-06-25 DIAGNOSIS — F1410 Cocaine abuse, uncomplicated: Secondary | ICD-10-CM

## 2022-06-25 NOTE — Patient Instructions (Addendum)
I had a long d/w patient about her recent strokes and cocaine abuse, risk for recurrent stroke/TIAs, personally independently reviewed imaging studies and stroke evaluation results and answered questions.Continue aspirin 81 mg daily  for secondary stroke prevention and stop Plavix now and maintain strict control of hypertension with blood pressure goal below 130/90, diabetes with hemoglobin A1c goal below 6.5% and lipids with LDL cholesterol goal below 70 mg/dL. I also advised the patient to eat a healthy diet with plenty of whole grains, cereals, fruits and vegetables, exercise regularly and maintain ideal body weight .referral to home health for physical and Occupational Therapy Home health aide..  Patient counseled to quit using marijuana, smoking and cocaine he is agreeable.  Clearly quite disabled and is unable to return to work at the present time.  I also advised the patient to find herself a primary care physician medical follow-up soon.  Followup in the future with my nurse practitioner in 6 months or call earlier if necessary.  Stroke Prevention Some medical conditions and behaviors can lead to a higher chance of having a stroke. You can help prevent a stroke by eating healthy, exercising, not smoking, and managing any medical conditions you have. Stroke is a leading cause of functional impairment. Primary prevention is particularly important because a majority of strokes are first-time events. Stroke changes the lives of not only those who experience a stroke but also their family and other caregivers. How can this condition affect me? A stroke is a medical emergency and should be treated right away. A stroke can lead to brain damage and can sometimes be life-threatening. If a person gets medical treatment right away, there is a better chance of surviving and recovering from a stroke. What can increase my risk? The following medical conditions may increase your risk of a stroke: Cardiovascular  disease. High blood pressure (hypertension). Diabetes. High cholesterol. Sickle cell disease. Blood clotting disorders (hypercoagulable state). Obesity. Sleep disorders (obstructive sleep apnea). Other risk factors include: Being older than age 64. Having a history of blood clots, stroke, or mini-stroke (transient ischemic attack, TIA). Genetic factors, such as race, ethnicity, or a family history of stroke. Smoking cigarettes or using other tobacco products. Taking birth control pills, especially if you also use tobacco. Heavy use of alcohol or drugs, especially cocaine and methamphetamine. Physical inactivity. What actions can I take to prevent this? Manage your health conditions High cholesterol levels. Eating a healthy diet is important for preventing high cholesterol. If cholesterol cannot be managed through diet alone, you may need to take medicines. Take any prescribed medicines to control your cholesterol as told by your health care provider. Hypertension. To reduce your risk of stroke, try to keep your blood pressure below 130/80. Eating a healthy diet and exercising regularly are important for controlling blood pressure. If these steps are not enough to manage your blood pressure, you may need to take medicines. Take any prescribed medicines to control hypertension as told by your health care provider. Ask your health care provider if you should monitor your blood pressure at home. Have your blood pressure checked every year, even if your blood pressure is normal. Blood pressure increases with age and some medical conditions. Diabetes. Eating a healthy diet and exercising regularly are important parts of managing your blood sugar (glucose). If your blood sugar cannot be managed through diet and exercise, you may need to take medicines. Take any prescribed medicines to control your diabetes as told by your health care provider. Get evaluated  for obstructive sleep apnea. Talk to  your health care provider about getting a sleep evaluation if you snore a lot or have excessive sleepiness. Make sure that any other medical conditions you have, such as atrial fibrillation or atherosclerosis, are managed. Nutrition Follow instructions from your health care provider about what to eat or drink to help manage your health condition. These instructions may include: Reducing your daily calorie intake. Limiting how much salt (sodium) you use to 1,500 milligrams (mg) each day. Using only healthy fats for cooking, such as olive oil, canola oil, or sunflower oil. Eating healthy foods. You can do this by: Choosing foods that are high in fiber, such as whole grains, and fresh fruits and vegetables. Eating at least 5 servings of fruits and vegetables a day. Try to fill one-half of your plate with fruits and vegetables at each meal. Choosing lean protein foods, such as lean cuts of meat, poultry without skin, fish, tofu, beans, and nuts. Eating low-fat dairy products. Avoiding foods that are high in sodium. This can help lower blood pressure. Avoiding foods that have saturated fat, trans fat, and cholesterol. This can help prevent high cholesterol. Avoiding processed and prepared foods. Counting your daily carbohydrate intake.  Lifestyle If you drink alcohol: Limit how much you have to: 0-1 drink a day for women who are not pregnant. 0-2 drinks a day for men. Know how much alcohol is in your drink. In the [place] one drink equals one 12 oz bottle of beer (31mL), one 5 oz glass of wine (141mL), or one 1 oz glass of hard liquor (31mL). Do not use any products that contain nicotine or tobacco. These products include cigarettes, chewing tobacco, and vaping devices, such as e-cigarettes. If you need help quitting, ask your health care provider. Avoid secondhand smoke. Do not use drugs. Activity  Try to stay at a healthy weight. Get at least 30 minutes of exercise on most days, such  as: Fast walking. Biking. Swimming. Medicines Take over-the-counter and prescription medicines only as told by your health care provider. Aspirin or blood thinners (antiplatelets or anticoagulants) may be recommended to reduce your risk of forming blood clots that can lead to stroke. Avoid taking birth control pills. Talk to your health care provider about the risks of taking birth control pills if: You are over 90 years old. You smoke. You get very bad headaches. You have had a blood clot. Where to find more information American Stroke Association: www.strokeassociation.org Get help right away if: You or a loved one has any symptoms of a stroke. "BE FAST" is an easy way to remember the main warning signs of a stroke: B - Balance. Signs are dizziness, sudden trouble walking, or loss of balance. E - Eyes. Signs are trouble seeing or a sudden change in vision. F - Face. Signs are sudden weakness or numbness of the face, or the face or eyelid drooping on one side. A - Arms. Signs are weakness or numbness in an arm. This happens suddenly and usually on one side of the body. S - Speech. Signs are sudden trouble speaking, slurred speech, or trouble understanding what people say. T - Time. Time to call emergency services. Write down what time symptoms started. You or a loved one has other signs of a stroke, such as: A sudden, severe headache with no known cause. Nausea or vomiting. Seizure. These symptoms may represent a serious problem that is an emergency. Do not wait to see if the symptoms will go  away. Get medical help right away. Call your local emergency services (911 in the [place] Do not drive yourself to the hospital. Summary You can help to prevent a stroke by eating healthy, exercising, not smoking, limiting alcohol intake, and managing any medical conditions you may have. Do not use any products that contain nicotine or tobacco. These include cigarettes, chewing tobacco, and vaping  devices, such as e-cigarettes. If you need help quitting, ask your health care provider. Remember "BE FAST" for warning signs of a stroke. Get help right away if you or a loved one has any of these signs. This information is not intended to replace advice given to you by your health care provider. Make sure you discuss any questions you have with your health care provider. Document Revised: 01/02/2020 Document Reviewed: 01/02/2020 Elsevier Patient Education  [place].

## 2022-06-25 NOTE — Telephone Encounter (Signed)
Pt has been declined by several home health agencies due to insurance [facility]) and hx of drug abuse. They are stating she would most likely have better luck with outpatient therapy.
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
## 2022-06-26 ENCOUNTER — Telehealth: Payer: Self-pay | Admitting: Neurology

## 2022-06-26 NOTE — Telephone Encounter (Signed)
Per other phone note "Pt has been declined by several home health agencies due to insurance [facility]) and hx of drug abuse. They are stating she would most likely have better luck with outpatient therapy. "  It appears the patient will need outpatient therapy at this time.

## 2022-06-26 NOTE — Telephone Encounter (Signed)
[id number] MRN hey this patient came over yesterday from you all she is a patient of doctor sethis she has a referral for home health and that is what she wants but it looks like she was sent two referrals here as well and she cannot do both can you pass this message on  Message sent from Rehab

## 2022-06-30 ENCOUNTER — Other Ambulatory Visit: Payer: Self-pay

## 2022-06-30 ENCOUNTER — Emergency Department (HOSPITAL_COMMUNITY): Payer: Medicaid Other

## 2022-06-30 ENCOUNTER — Emergency Department (HOSPITAL_COMMUNITY)
Admission: EM | Admit: 2022-06-30 | Discharge: 2022-06-30 | Disposition: A | Discharge status: Home / Self Care | Payer: Medicaid Other | Attending: Emergency Medicine | Admitting: Emergency Medicine

## 2022-06-30 DIAGNOSIS — R4182 Altered mental status, unspecified: Secondary | ICD-10-CM | POA: Diagnosis present

## 2022-06-30 DIAGNOSIS — Z79899 Other long term (current) drug therapy: Secondary | ICD-10-CM | POA: Insufficient documentation

## 2022-06-30 DIAGNOSIS — Z7982 Long term (current) use of aspirin: Secondary | ICD-10-CM | POA: Diagnosis not present

## 2022-06-30 DIAGNOSIS — I10 Essential (primary) hypertension: Secondary | ICD-10-CM | POA: Insufficient documentation

## 2022-06-30 DIAGNOSIS — Z7984 Long term (current) use of oral hypoglycemic drugs: Secondary | ICD-10-CM | POA: Diagnosis not present

## 2022-06-30 DIAGNOSIS — Z7902 Long term (current) use of antithrombotics/antiplatelets: Secondary | ICD-10-CM | POA: Insufficient documentation

## 2022-06-30 DIAGNOSIS — E119 Type 2 diabetes mellitus without complications: Secondary | ICD-10-CM | POA: Diagnosis not present

## 2022-06-30 LAB — COMPREHENSIVE METABOLIC PANEL
ALT: 15 U/L (ref 0–44)
AST: 14 U/L — ABNORMAL LOW (ref 15–41)
Albumin: 3.3 g/dL — ABNORMAL LOW (ref 3.5–5.0)
Alkaline Phosphatase: 63 U/L (ref 38–126)
Anion gap: 7 (ref 5–15)
BUN: 17 mg/dL (ref 6–20)
CO2: 25 mmol/L (ref 22–32)
Calcium: 8.6 mg/dL — ABNORMAL LOW (ref 8.9–10.3)
Chloride: 105 mmol/L (ref 98–111)
Creatinine, Ser: 0.74 mg/dL (ref 0.44–1.00)
GFR, Estimated: >60 mL/min (ref >60)
Glucose, Bld: 87 mg/dL (ref 70–99)
Potassium: 4 mmol/L (ref 3.5–5.1)
Sodium: 137 mmol/L (ref 135–145)
Total Bilirubin: 0.2 mg/dL — ABNORMAL LOW (ref 0.3–1.2)
Total Protein: 6.7 g/dL (ref 6.5–8.1)

## 2022-06-30 LAB — ETHANOL: Alcohol, Ethyl (B): <10 mg/dL (ref <10)

## 2022-06-30 LAB — URINALYSIS, ROUTINE W REFLEX MICROSCOPIC
Bilirubin Urine: NEGATIVE
Glucose, UA: NEGATIVE mg/dL
Hgb urine dipstick: NEGATIVE
Ketones, ur: NEGATIVE mg/dL
Leukocytes,Ua: NEGATIVE
Nitrite: NEGATIVE
Protein, ur: NEGATIVE mg/dL
Specific Gravity, Urine: 1.02 (ref 1.005–1.030)
pH: 5 (ref 5.0–8.0)

## 2022-06-30 LAB — MAGNESIUM: Magnesium: 1.8 mg/dL (ref 1.7–2.4)

## 2022-06-30 LAB — CBC WITH DIFFERENTIAL/PLATELET
Abs Immature Granulocytes: 0 10*3/uL (ref 0.00–0.07)
Basophils Absolute: 0 10*3/uL (ref 0.0–0.1)
Basophils Relative: 0 %
Eosinophils Absolute: 0.2 10*3/uL (ref 0.0–0.5)
Eosinophils Relative: 4 %
HCT: 33.9 % — ABNORMAL LOW (ref 36.0–46.0)
Hemoglobin: 9.8 g/dL — ABNORMAL LOW (ref 12.0–15.0)
Immature Granulocytes: 0 %
Lymphocytes Relative: 21 %
Lymphs Abs: 1.1 10*3/uL (ref 0.7–4.0)
MCH: 23.5 pg — ABNORMAL LOW (ref 26.0–34.0)
MCHC: 28.9 g/dL — ABNORMAL LOW (ref 30.0–36.0)
MCV: 81.3 fL (ref 80.0–100.0)
Monocytes Absolute: 0.3 10*3/uL (ref 0.1–1.0)
Monocytes Relative: 6 %
Neutro Abs: 3.8 10*3/uL (ref 1.7–7.7)
Neutrophils Relative %: 69 %
Platelets: 235 10*3/uL (ref 150–400)
RBC: 4.17 MIL/uL (ref 3.87–5.11)
RDW: 19.1 % — ABNORMAL HIGH (ref 11.5–15.5)
WBC: 5.5 10*3/uL (ref 4.0–10.5)
nRBC: 0 % (ref 0.0–0.2)

## 2022-06-30 LAB — AMMONIA: Ammonia: 19 umol/L (ref 9–35)

## 2022-06-30 LAB — RAPID URINE DRUG SCREEN, HOSP PERFORMED
Amphetamines: NOT DETECTED
Barbiturates: NOT DETECTED
Benzodiazepines: NOT DETECTED
Cocaine: POSITIVE — AB
Opiates: NOT DETECTED
Tetrahydrocannabinol: POSITIVE — AB

## 2022-06-30 NOTE — ED Triage Notes (Signed)
Pt from home via [facility] with reports of AMS. Per ems is oriented x 1. Pt will not state why she called EMS. Pt continues to repeat the same phases multiple times.

## 2022-06-30 NOTE — ED Provider Notes (Signed)
[facility] EMERGENCY DEPARTMENT Provider Note   CSN: [id number] Arrival date & time: 06/30/22  1231     History  Chief Complaint  Patient presents with   Altered Mental Status    Kaitlyn Gray is a 47 y.o. female presented to ED with concern for altered mental status.  The patient has a complicated medical history which includes bipolar disorder, polysubstance use, cocaine abuse and vasculopathy, cannabis use, hypertension, diabetes, hyperlipidemia, hospitalizations for strokelike symptoms, presenting to the emergency department today with concern for altered mental status.  EMS reports that the patient had called him out the scene, otherwise a poor story and repeating himself over and over when they arrived.  The patient is level 5 caveat.  On her arrival here, she is babbling nonsensically, not able to answer questions, stating "every time I come to tell me not to move, not the move from the bed, not to move."  When asked if she has a headache, she states no.  He was most recently discharged from almost 1 month ago, December 20, after hospitalization for altered mental status and left-sided weakness and dysarthria and facial asymmetry.  Neurology was consulted and felt that his presentation was more consistent with hypertensive encephalopathy.  PT had recommended SNF placement, but per social work, patient was extremely difficult to place due to insurance difficulties and ongoing cocaine use.  Patient was briefly on a Cleviprex drip in the hospital initially.  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  Patient is noted to have baseline left-sided weakness of the upper and lower extremity from the stroke.  HPI     Home Medications Prior to Admission medications   Medication Sig Start Date End Date Taking? Authorizing Provider  amLODipine (NORVASC) 10 MG tablet Take 1 tablet (10 mg total) by  mouth daily. 06/04/22 09/02/22  [place] Donnamarie Poag, DO  aspirin EC 81 MG tablet Take 1 tablet (81 mg total) by mouth daily. Swallow whole. 06/05/22 09/03/22  [place] Eric J, DO  clopidogrel (PLAVIX) 75 MG tablet Take 1 tablet (75 mg total) by mouth daily. 06/04/22 09/02/22  [place] Donnamarie Poag, DO  ferrous sulfate 325 (65 FE) MG tablet Take 1 tablet (325 mg total) by mouth every other day. 06/04/22 09/02/22  [place] Donnamarie Poag, DO  gabapentin (NEURONTIN) 300 MG capsule Take 1 capsule (300 mg total) by mouth at bedtime. 06/04/22 09/02/22  [place] Donnamarie Poag, DO  losartan (COZAAR) 100 MG tablet Take 1 tablet (100 mg total) by mouth daily. 06/04/22 09/02/22  [place] Donnamarie Poag, DO  metFORMIN (GLUCOPHAGE) 500 MG tablet Take 1 tablet (500 mg total) by mouth 2 (two) times daily with a meal. 06/04/22 09/02/22  [place] Eric J, DO  pantoprazole (PROTONIX) 40 MG tablet Take 1 tablet (40 mg total) by mouth daily. 06/04/22 09/02/22  [place] Donnamarie Poag, DO  QUEtiapine (SEROQUEL) 200 MG tablet Take 1 tablet (200 mg total) by mouth at bedtime. 06/04/22 09/02/22  [place] Donnamarie Poag, DO  rosuvastatin (CRESTOR) 40 MG tablet Take 1 tablet (40 mg total) by mouth daily. 06/04/22 09/02/22  [place] Eric J, DO      Allergies    Hydrocodone and Latex    Review of Systems   Review of Systems  Physical Exam Updated Vital Signs BP (!) 140/89   Pulse 69   Temp 98 F (36.7 C)   Resp 19   SpO2 100%  Physical Exam Constitutional:  General: She is not in acute distress. HENT:     Head: Normocephalic and atraumatic.  Eyes:     Conjunctiva/sclera: Conjunctivae normal.     Pupils: Pupils are equal, round, and reactive to light.  Cardiovascular:     Rate and Rhythm: Normal rate and regular rhythm.  Pulmonary:     Effort: Pulmonary effort is normal. No respiratory distress.  Abdominal:     General: There is no distension.     Tenderness: There is no abdominal tenderness.  Musculoskeletal:     Comments: 1/5 strength left upper and lower  extremity  Skin:    General: Skin is warm and dry.  Neurological:     Mental Status: She is alert.     Comments: Moving all extremities spontaneously, would not cooperate with remaining neurological exam; speaking slowly and nonsensically     ED Results / Procedures / Treatments   Labs (all labs ordered are listed, but only abnormal results are displayed) Labs Reviewed  CBC WITH DIFFERENTIAL/PLATELET - Abnormal; Notable for the following components:      Result Value   Hemoglobin 9.8 (*)    HCT 33.9 (*)    MCH 23.5 (*)    MCHC 28.9 (*)    RDW 19.1 (*)    All other components within normal limits  RAPID URINE DRUG SCREEN, HOSP PERFORMED - Abnormal; Notable for the following components:   Cocaine POSITIVE (*)    Tetrahydrocannabinol POSITIVE (*)    All other components within normal limits  URINALYSIS, ROUTINE W REFLEX MICROSCOPIC - Abnormal; Notable for the following components:   APPearance HAZY (*)    All other components within normal limits  COMPREHENSIVE METABOLIC PANEL - Abnormal; Notable for the following components:   Calcium 8.6 (*)    Albumin 3.3 (*)    AST 14 (*)    Total Bilirubin 0.2 (*)    All other components within normal limits  MAGNESIUM  AMMONIA  ETHANOL    EKG EKG Interpretation  Date/Time:  Monday June 30 2022 12:46:41 EST Ventricular Rate:  55 PR Interval:  124 QRS Duration: 96 QT Interval:  467 QTC Calculation: 447 R Axis:   66 Text Interpretation: Sinus rhythm Probable left ventricular hypertrophy Minimal ST elevation, lateral leads Confirmed by Alvester Chou [phone]) on 06/30/2022 12:52:40 PM  Radiology CT Head Wo Contrast  Result Date: 06/30/2022 CLINICAL DATA:  Altered mental status EXAM: CT HEAD WITHOUT CONTRAST TECHNIQUE: Contiguous axial images were obtained from the base of the skull through the vertex without intravenous contrast. RADIATION DOSE REDUCTION: This exam was performed according to the departmental dose-optimization  program which includes automated exposure control, adjustment of the mA and/or kV according to patient size and/or use of iterative reconstruction technique. COMPARISON:  CT/CTA head and neck 05/31/2022 FINDINGS: Brain: There is no acute intracranial hemorrhage, extra-axial fluid collection, or acute infarct Background parenchymal volume is within normal limits. The ventricles are normal in size. Remote infarcts in the left parietal lobe at the vertex, right occipital lobe, and right cerebellar hemisphere and middle cerebellar peduncle are unchanged. There is no mass lesion.  There is no mass effect or midline shift. Vascular: No hyperdense vessel or unexpected calcification. Skull: Normal. Negative for fracture or focal lesion. Sinuses/Orbits: There are small retention cysts in the maxillary sinus. There is remote fracture of the right lamina papyracea. The globes and orbits are otherwise unremarkable. Other: None. IMPRESSION: Stable noncontrast head CT with no acute intracranial pathology. Electronically Signed   By:  Valetta Mole M.D.   On: 06/30/2022 14:54    Procedures Procedures    Medications Ordered in ED Medications - No data to display  ED Course/ Medical Decision Making/ A&P Clinical Course as of 06/30/22 1559  Mon Jun 30, 2022  1554 UDS + cocaine, and [facility] [MT]  1558 Patient will be called to take the patient home.  Unfortunately, she does have known weakness of the left upper and lower extremity which limits her mobility, and is needing a walker to ground in the house, but this is her current baseline [MT]    Clinical Course User Index [MT] Kristyl Athens, Carola Rhine, MD                             Medical Decision Making Amount and/or Complexity of Data Reviewed Labs: ordered. Radiology: ordered.   This patient presents to the Emergency Department with complaint of altered mental status.  This involves an extensive number of treatment options, and is a complaint that carries with it a  high risk of complications and morbidity.  The differential diagnosis includes hypoglycemia vs metabolic encephalopathy vs infection (including cystitis) vs ICH vs stroke vs polypharmacy vs other  I ordered, reviewed, and interpreted labs, including no acute emergent findings.  UDS is positive for cocaine and THC I ordered imaging studies which included CT scan of the head I independently visualized and interpreted imaging which showed no acute infarct or abnormalities and the monitor tracing which showed normal sinus rhythm  Additional history was obtained from EMS  Previous records obtained and reviewed showing neurology office evaluation for 5 days ago where her neurologist knows that she has chronic hemiplegia of the left side as a residual stroke deficit, with 1 out of 5 strength, which is also consistent with her exam and presentation today.  Unfortunately due to her ongoing polysubstance use including cocaine use, social work is had difficulty and not been able to place her into a rehab facility.  She continues to use cocaine  I personally reviewed the patients ECG which showed sinus rhythm with no acute ischemic findings  After the interventions stated above, I reevaluated the patient and found improvement of cognitive function and status back to baseline         Final Clinical Impression(s) / ED Diagnoses Final diagnoses:  Altered mental status, unspecified altered mental status type    Rx / DC Orders ED Discharge Orders     None         Bentlie Catanzaro, Carola Rhine, MD 06/30/22 [phone]

## 2022-06-30 NOTE — Discharge Instructions (Signed)
Your workup did not show any emergencies.  I strongly recommend that you avoid using any types of drugs or alcohol that may make you confused at home.  Please continue taking your normal medications and drink plenty of water.  Follow-up with your primary care doctor

## 2022-07-17 ENCOUNTER — Ambulatory Visit (INDEPENDENT_AMBULATORY_CARE_PROVIDER_SITE_OTHER): Payer: Medicaid Other | Admitting: Family Medicine

## 2022-07-17 ENCOUNTER — Encounter: Payer: Self-pay | Admitting: Family Medicine

## 2022-07-17 ENCOUNTER — Other Ambulatory Visit: Payer: Self-pay

## 2022-07-17 VITALS — BP 151/83 | HR 71 | Temp 97.1°F | Resp 16 | Wt 182.4 lb

## 2022-07-17 DIAGNOSIS — I152 Hypertension secondary to endocrine disorders: Secondary | ICD-10-CM

## 2022-07-17 DIAGNOSIS — F1910 Other psychoactive substance abuse, uncomplicated: Secondary | ICD-10-CM

## 2022-07-17 DIAGNOSIS — F32A Depression, unspecified: Secondary | ICD-10-CM | POA: Diagnosis not present

## 2022-07-17 DIAGNOSIS — K219 Gastro-esophageal reflux disease without esophagitis: Secondary | ICD-10-CM

## 2022-07-17 DIAGNOSIS — I69354 Hemiplegia and hemiparesis following cerebral infarction affecting left non-dominant side: Secondary | ICD-10-CM

## 2022-07-17 DIAGNOSIS — D649 Anemia, unspecified: Secondary | ICD-10-CM

## 2022-07-17 DIAGNOSIS — E1159 Type 2 diabetes mellitus with other circulatory complications: Secondary | ICD-10-CM | POA: Diagnosis not present

## 2022-07-17 DIAGNOSIS — Z7689 Persons encountering health services in other specified circumstances: Secondary | ICD-10-CM

## 2022-07-17 DIAGNOSIS — E11649 Type 2 diabetes mellitus with hypoglycemia without coma: Secondary | ICD-10-CM

## 2022-07-17 MED ORDER — PAROXETINE HCL 20 MG PO TABS
20.0000 mg | ORAL_TABLET | Freq: Every day | ORAL | 1 refills | Status: DC
  Filled 2022-07-17: qty 30, 30d supply, fill #0
  Filled 2022-09-14: qty 30, 30d supply, fill #1
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
## 2022-07-18 ENCOUNTER — Encounter: Payer: Self-pay | Admitting: Family Medicine

## 2022-08-04 ENCOUNTER — Emergency Department (HOSPITAL_COMMUNITY): Payer: Medicaid Other

## 2022-08-04 ENCOUNTER — Observation Stay (HOSPITAL_COMMUNITY)
Admission: EM | Admit: 2022-08-04 | Discharge: 2022-08-05 | Disposition: A | Discharge status: Home / Self Care | Payer: Medicaid Other | Attending: Student | Admitting: Student

## 2022-08-04 ENCOUNTER — Other Ambulatory Visit: Payer: Self-pay

## 2022-08-04 ENCOUNTER — Encounter (HOSPITAL_COMMUNITY): Payer: Self-pay | Admitting: Internal Medicine

## 2022-08-04 DIAGNOSIS — I10 Essential (primary) hypertension: Secondary | ICD-10-CM | POA: Diagnosis not present

## 2022-08-04 DIAGNOSIS — F32A Depression, unspecified: Secondary | ICD-10-CM | POA: Diagnosis present

## 2022-08-04 DIAGNOSIS — Z91040 Latex allergy status: Secondary | ICD-10-CM | POA: Diagnosis not present

## 2022-08-04 DIAGNOSIS — Z7902 Long term (current) use of antithrombotics/antiplatelets: Secondary | ICD-10-CM | POA: Insufficient documentation

## 2022-08-04 DIAGNOSIS — R2990 Unspecified symptoms and signs involving the nervous system: Secondary | ICD-10-CM | POA: Insufficient documentation

## 2022-08-04 DIAGNOSIS — R4182 Altered mental status, unspecified: Secondary | ICD-10-CM

## 2022-08-04 DIAGNOSIS — I639 Cerebral infarction, unspecified: Secondary | ICD-10-CM | POA: Diagnosis present

## 2022-08-04 DIAGNOSIS — G9340 Encephalopathy, unspecified: Secondary | ICD-10-CM | POA: Diagnosis not present

## 2022-08-04 DIAGNOSIS — E66811 Obesity, class 1: Secondary | ICD-10-CM | POA: Diagnosis present

## 2022-08-04 DIAGNOSIS — Z8673 Personal history of transient ischemic attack (TIA), and cerebral infarction without residual deficits: Secondary | ICD-10-CM | POA: Diagnosis not present

## 2022-08-04 DIAGNOSIS — Z7982 Long term (current) use of aspirin: Secondary | ICD-10-CM | POA: Diagnosis not present

## 2022-08-04 DIAGNOSIS — F319 Bipolar disorder, unspecified: Secondary | ICD-10-CM | POA: Diagnosis not present

## 2022-08-04 DIAGNOSIS — R531 Weakness: Secondary | ICD-10-CM

## 2022-08-04 DIAGNOSIS — G929 Unspecified toxic encephalopathy: Secondary | ICD-10-CM | POA: Diagnosis present

## 2022-08-04 DIAGNOSIS — Z79899 Other long term (current) drug therapy: Secondary | ICD-10-CM | POA: Insufficient documentation

## 2022-08-04 DIAGNOSIS — Z7984 Long term (current) use of oral hypoglycemic drugs: Secondary | ICD-10-CM | POA: Diagnosis not present

## 2022-08-04 DIAGNOSIS — F17210 Nicotine dependence, cigarettes, uncomplicated: Secondary | ICD-10-CM | POA: Diagnosis not present

## 2022-08-04 DIAGNOSIS — D509 Iron deficiency anemia, unspecified: Secondary | ICD-10-CM | POA: Diagnosis not present

## 2022-08-04 DIAGNOSIS — E11649 Type 2 diabetes mellitus with hypoglycemia without coma: Secondary | ICD-10-CM

## 2022-08-04 DIAGNOSIS — E669 Obesity, unspecified: Secondary | ICD-10-CM

## 2022-08-04 DIAGNOSIS — F1910 Other psychoactive substance abuse, uncomplicated: Secondary | ICD-10-CM | POA: Diagnosis present

## 2022-08-04 DIAGNOSIS — F250 Schizoaffective disorder, bipolar type: Secondary | ICD-10-CM | POA: Diagnosis present

## 2022-08-04 DIAGNOSIS — I152 Hypertension secondary to endocrine disorders: Secondary | ICD-10-CM | POA: Diagnosis present

## 2022-08-04 DIAGNOSIS — E119 Type 2 diabetes mellitus without complications: Secondary | ICD-10-CM | POA: Diagnosis not present

## 2022-08-04 DIAGNOSIS — E1159 Type 2 diabetes mellitus with other circulatory complications: Secondary | ICD-10-CM

## 2022-08-04 LAB — COMPREHENSIVE METABOLIC PANEL
ALT: 13 U/L (ref 0–44)
AST: 17 U/L (ref 15–41)
Albumin: 3.1 g/dL — ABNORMAL LOW (ref 3.5–5.0)
Alkaline Phosphatase: 50 U/L (ref 38–126)
Anion gap: 9 (ref 5–15)
BUN: 13 mg/dL (ref 6–20)
CO2: 24 mmol/L (ref 22–32)
Calcium: 8.8 mg/dL — ABNORMAL LOW (ref 8.9–10.3)
Chloride: 106 mmol/L (ref 98–111)
Creatinine, Ser: 0.73 mg/dL (ref 0.44–1.00)
GFR, Estimated: >60 mL/min (ref >60)
Glucose, Bld: 105 mg/dL — ABNORMAL HIGH (ref 70–99)
Potassium: 3.8 mmol/L (ref 3.5–5.1)
Sodium: 139 mmol/L (ref 135–145)
Total Bilirubin: 0.2 mg/dL — ABNORMAL LOW (ref 0.3–1.2)
Total Protein: 6.3 g/dL — ABNORMAL LOW (ref 6.5–8.1)

## 2022-08-04 LAB — I-STAT CHEM 8, ED
BUN: 18 mg/dL (ref 6–20)
Calcium, Ion: 1.15 mmol/L (ref 1.15–1.40)
Chloride: 104 mmol/L (ref 98–111)
Creatinine, Ser: 0.8 mg/dL (ref 0.44–1.00)
Glucose, Bld: 100 mg/dL — ABNORMAL HIGH (ref 70–99)
HCT: 28 % — ABNORMAL LOW (ref 36.0–46.0)
Hemoglobin: 9.5 g/dL — ABNORMAL LOW (ref 12.0–15.0)
Potassium: 3.8 mmol/L (ref 3.5–5.1)
Sodium: 142 mmol/L (ref 135–145)
TCO2: 26 mmol/L (ref 22–32)

## 2022-08-04 LAB — CBG MONITORING, ED: Glucose-Capillary: 111 mg/dL — ABNORMAL HIGH (ref 70–99)

## 2022-08-04 LAB — CBC
HCT: 28.4 % — ABNORMAL LOW (ref 36.0–46.0)
Hemoglobin: 8.3 g/dL — ABNORMAL LOW (ref 12.0–15.0)
MCH: 22.7 pg — ABNORMAL LOW (ref 26.0–34.0)
MCHC: 29.2 g/dL — ABNORMAL LOW (ref 30.0–36.0)
MCV: 77.8 fL — ABNORMAL LOW (ref 80.0–100.0)
Platelets: 288 10*3/uL (ref 150–400)
RBC: 3.65 MIL/uL — ABNORMAL LOW (ref 3.87–5.11)
RDW: 17.9 % — ABNORMAL HIGH (ref 11.5–15.5)
WBC: 4.4 10*3/uL (ref 4.0–10.5)
nRBC: 0 % (ref 0.0–0.2)

## 2022-08-04 LAB — I-STAT VENOUS BLOOD GAS, ED
Acid-Base Excess: 4 mmol/L — ABNORMAL HIGH (ref 0.0–2.0)
Bicarbonate: 27.5 mmol/L (ref 20.0–28.0)
Calcium, Ion: 1.14 mmol/L — ABNORMAL LOW (ref 1.15–1.40)
HCT: 28 % — ABNORMAL LOW (ref 36.0–46.0)
Hemoglobin: 9.5 g/dL — ABNORMAL LOW (ref 12.0–15.0)
O2 Saturation: 81 %
Potassium: 3.8 mmol/L (ref 3.5–5.1)
Sodium: 141 mmol/L (ref 135–145)
TCO2: 29 mmol/L (ref 22–32)
pCO2, Ven: 37.1 mmHg — ABNORMAL LOW (ref 44–60)
pH, Ven: 7.479 — ABNORMAL HIGH (ref 7.25–7.43)
pO2, Ven: 41 mmHg (ref 32–45)

## 2022-08-04 LAB — URINALYSIS, W/ REFLEX TO CULTURE (INFECTION SUSPECTED)
Bilirubin Urine: NEGATIVE
Glucose, UA: NEGATIVE mg/dL
Ketones, ur: NEGATIVE mg/dL
Leukocytes,Ua: NEGATIVE
Nitrite: NEGATIVE
Protein, ur: NEGATIVE mg/dL
Specific Gravity, Urine: <1.005 — ABNORMAL LOW (ref 1.005–1.030)
pH: 6 (ref 5.0–8.0)

## 2022-08-04 LAB — RAPID URINE DRUG SCREEN, HOSP PERFORMED
Amphetamines: NOT DETECTED
Barbiturates: NOT DETECTED
Benzodiazepines: NOT DETECTED
Cocaine: POSITIVE — AB
Opiates: NOT DETECTED
Tetrahydrocannabinol: NOT DETECTED

## 2022-08-04 LAB — TSH: TSH: 0.955 u[IU]/mL (ref 0.350–4.500)

## 2022-08-04 LAB — ETHANOL: Alcohol, Ethyl (B): <10 mg/dL (ref <10)

## 2022-08-04 LAB — MAGNESIUM: Magnesium: 1.9 mg/dL (ref 1.7–2.4)

## 2022-08-04 MED ORDER — ACETAMINOPHEN 650 MG RE SUPP: 650.0000 mg | Freq: Four times a day (QID) | RECTAL | Status: DC | PRN

## 2022-08-04 MED ORDER — GABAPENTIN 300 MG PO CAPS
300.0000 mg | ORAL_CAPSULE | Freq: Every day | ORAL | Status: DC
  Administered 2022-08-04: 300 mg via ORAL
  Filled 2022-08-04: qty 1

## 2022-08-04 MED ORDER — QUETIAPINE FUMARATE 100 MG PO TABS
200.0000 mg | ORAL_TABLET | Freq: Every day | ORAL | Status: DC
  Administered 2022-08-04: 200 mg via ORAL
  Filled 2022-08-04: qty 2

## 2022-08-04 MED ORDER — ROSUVASTATIN CALCIUM 20 MG PO TABS
40.0000 mg | ORAL_TABLET | Freq: Every day | ORAL | Status: DC
  Administered 2022-08-05: 40 mg via ORAL
  Filled 2022-08-04: qty 2

## 2022-08-04 MED ORDER — INSULIN ASPART 100 UNIT/ML IJ SOLN
0.0000 [IU] | INTRAMUSCULAR | Status: DC
  Administered 2022-08-04: 2 [IU] via SUBCUTANEOUS
  Administered 2022-08-05: 1 [IU] via SUBCUTANEOUS

## 2022-08-04 MED ORDER — PAROXETINE HCL 20 MG PO TABS
20.0000 mg | ORAL_TABLET | Freq: Every day | ORAL | Status: DC
  Administered 2022-08-05: 20 mg via ORAL
  Filled 2022-08-04: qty 1

## 2022-08-04 MED ORDER — ACETAMINOPHEN 325 MG PO TABS: 650.0000 mg | ORAL_TABLET | Freq: Four times a day (QID) | ORAL | Status: DC | PRN

## 2022-08-04 MED ORDER — SODIUM CHLORIDE 0.9% FLUSH
3.0000 mL | Freq: Two times a day (BID) | INTRAVENOUS | Status: DC
  Administered 2022-08-04 (×2): 3 mL via INTRAVENOUS

## 2022-08-04 MED ORDER — ASPIRIN 81 MG PO TBEC
81.0000 mg | DELAYED_RELEASE_TABLET | Freq: Every day | ORAL | Status: DC
  Administered 2022-08-05: 81 mg via ORAL
  Filled 2022-08-04: qty 1

## 2022-08-04 MED ORDER — ENOXAPARIN SODIUM 40 MG/0.4ML IJ SOSY
40.0000 mg | PREFILLED_SYRINGE | INTRAMUSCULAR | Status: DC
  Administered 2022-08-04: 40 mg via SUBCUTANEOUS
  Filled 2022-08-04: qty 0.4

## 2022-08-04 MED ORDER — CLOPIDOGREL BISULFATE 75 MG PO TABS
75.0000 mg | ORAL_TABLET | Freq: Every day | ORAL | Status: DC
  Administered 2022-08-05: 75 mg via ORAL
  Filled 2022-08-04: qty 1

## 2022-08-04 MED ORDER — IOHEXOL 350 MG/ML SOLN
100.0000 mL | Freq: Once | INTRAVENOUS | Status: AC | PRN
  Administered 2022-08-04: 100 mL via INTRAVENOUS

## 2022-08-04 MED ORDER — POLYETHYLENE GLYCOL 3350 17 G PO PACK: 17.0000 g | PACK | Freq: Every day | ORAL | Status: DC | PRN

## 2022-08-04 MED ORDER — PANTOPRAZOLE SODIUM 40 MG PO TBEC
40.0000 mg | DELAYED_RELEASE_TABLET | Freq: Every day | ORAL | Status: DC
  Administered 2022-08-05: 40 mg via ORAL
  Filled 2022-08-04: qty 1

## 2022-08-04 NOTE — ED Notes (Signed)
Pt woke up and called out demanding food.  Before this writer could get into Pt's room to assess neuro status, Pt was back asleep.

## 2022-08-04 NOTE — ED Notes (Signed)
Pt alert to voice/touch.  Pt unable to stay awake for very long.  Pt aware we need a urine sample and aware we have a [place] in place.

## 2022-08-04 NOTE — ED Triage Notes (Signed)
Patient bib [facility] from home with complaints of left sided weakness. Patient has a history of stroke with left sided weakness but worsening symptoms today. Speech is slurred but this is normal. She does have a history of poly substance abuse. VSS

## 2022-08-04 NOTE — H&P (Signed)
History and Physical   Kaitlyn Gray [id number] DOB: April 20, 1976 DOA: 08/04/2022  PCP: Dorna Mai, MD   Patient coming from: Home  Chief Complaint: Weakness  HPI: Kaitlyn Gray is a 47 y.o. 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  History somewhat difficult to obtain due to patient's known dysarthria and possible altered mentation.  Reportedly had episode of weakness starting when she woke up at 8 AM.  She lives alone and called EMS.  Per reports, EMS initially noted she was diffusely weak and difficult to obtain history from.  Glucose level was 160.  Has continued issues with her dysarthria/mumbling in the ED.  Unable to obtain full review of systems due to altered mentation versus patient participation.  ED Course: Vital signs in the ED significant for blood pressure in the [id number] to [id number] systolic.  Lab workup included CMP with glucose 105, calcium 8.8, protein 6.3, albumin 3.1.  CBC with hemoglobin 8.3 which is down from baseline of 9-10.  Ethanol level negative.  UDS and urinalysis pending.  CT T head showed no acute normality.  CTA of the head and neck showed no emergent large vessel occlusion but did have a limited intracranial study, also noted was severe anterior and posterior circulation stenosis which could have been artifactual, stable right internal carotid stenosis of 70%.  MRI of the brain showed no acute normality but did demonstrate known remote infarcts.  MRI of the brain showed severe right M2 stenosis and occlusion of distal right PCA at site of known prior infarction.  Chest x-ray pending.  Neurology consulted in the ED and with negative MRI was considering EEG per EDP.  Do not see a note yet but reportedly they are likely following.  Review of Systems: Unable to obtain full review  of systems due to altered mentation versus patient participation.  Past Medical History:  Diagnosis Date   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   Bipolar 1 disorder (HCC)    Cannabis use disorder, moderate, dependence ([place]) 03/24/2015   Chronic anemia    Cocaine use disorder, severe, dependence ([place]) 03/24/2015   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    Encounter for assessment of healthcare decision-making capacity    History of cervical fracture 12/24/2017   nondisplaced fracture lateral mass of C1 on the right on CT 12/24/17   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 ([place]) 07/19/2017   Hypertension    Hypertensive emergency 05/31/2022   Ischemic stroke ([place]) 01/01/2020   subacute right middle cerebellar peduncle and pons infarction   Left-sided weakness 01/27/2022   Head CT with remote right occipital infarct which is consistent with old left-sided weakness   MDD (major depressive disorder), recurrent severe, without psychosis ([place]) 03/24/2015   Normocytic anemia 02/09/2020   Opiate use    History of Suboxone Therapy until 05/2021   Polysubstance abuse ([place]) 07/19/2017   Prescription drug misadventures ([place])     Past Surgical History:  Procedure Laterality Date   CESAREAN SECTION      Social History  reports that she has been smoking cigarettes. She has been smoking an average of .5 packs per day. She has never used smokeless tobacco. She reports current drug use. Drugs: Marijuana and Cocaine. She reports that she does not drink alcohol.  Allergies  Allergen Reactions   Hydrocodone Itching   Latex Itching  and Rash    Family History  Problem Relation Age of Onset   Hypertension Mother    CAD Mother 9       died of MI at age 53   Hypertension Father   Reviewed on admission  Prior to Admission medications   Medication Sig Start Date End Date Taking? Authorizing Provider  amLODipine (NORVASC) 10 MG tablet Take 1  tablet (10 mg total) by mouth daily. 06/04/22 09/02/22  [place] Donnamarie Poag, DO  aspirin EC 81 MG tablet Take 1 tablet (81 mg total) by mouth daily. Swallow whole. 06/05/22 09/03/22  [place] Eric J, DO  clopidogrel (PLAVIX) 75 MG tablet Take 1 tablet (75 mg total) by mouth daily. 06/04/22 09/02/22  [place] Donnamarie Poag, DO  ferrous sulfate 325 (65 FE) MG tablet Take 1 tablet (325 mg total) by mouth every other day. 06/04/22 09/02/22  [place] Donnamarie Poag, DO  gabapentin (NEURONTIN) 300 MG capsule Take 1 capsule (300 mg total) by mouth at bedtime. 06/04/22 09/02/22  [place] Donnamarie Poag, DO  losartan (COZAAR) 100 MG tablet Take 1 tablet (100 mg total) by mouth daily. 06/04/22 09/02/22  [place] Donnamarie Poag, DO  metFORMIN (GLUCOPHAGE) 500 MG tablet Take 1 tablet (500 mg total) by mouth 2 (two) times daily with a meal. 06/04/22 09/02/22  [place] Eric J, DO  pantoprazole (PROTONIX) 40 MG tablet Take 1 tablet (40 mg total) by mouth daily. 06/04/22 09/02/22  [place] Donnamarie Poag, DO  PARoxetine (PAXIL) 20 MG tablet Take 1 tablet (20 mg total) by mouth daily. 07/17/22   Dorna Mai, MD  QUEtiapine (SEROQUEL) 200 MG tablet Take 1 tablet (200 mg total) by mouth at bedtime. 06/04/22 09/02/22  [place] Donnamarie Poag, DO  rosuvastatin (CRESTOR) 40 MG tablet Take 1 tablet (40 mg total) by mouth daily. 06/04/22 09/02/22  [place] Eric J, DO    Physical Exam: Vitals:   08/04/22 0949 08/04/22 1042  BP: (!) 166/90 (!) 158/89  Pulse: 72 60  Resp: 18 16  Temp: (!) 97.4 F (36.3 C)   TempSrc: Oral   SpO2: 100% 100%  Weight:  82.7 kg    Physical Exam Constitutional:      General: She is not in acute distress.    Appearance: Normal appearance.  HENT:     Head: Normocephalic and atraumatic.     Mouth/Throat:     Mouth: Mucous membranes are moist.     Pharynx: Oropharynx is clear.  Eyes:     Extraocular Movements: Extraocular movements intact.     Pupils: Pupils are equal, round, and reactive to light.  Cardiovascular:     Rate and Rhythm:  Normal rate and regular rhythm.     Pulses: Normal pulses.     Heart sounds: Normal heart sounds.  Pulmonary:     Effort: Pulmonary effort is normal. No respiratory distress.     Breath sounds: Normal breath sounds.  Abdominal:     General: Bowel sounds are normal. There is no distension.     Palpations: Abdomen is soft.     Tenderness: There is no abdominal tenderness.  Musculoskeletal:        General: No swelling or deformity.  Skin:    General: Skin is warm and dry.  Neurological:     Comments: Patient is drowsy but arousable. Left-sided weakness and dysarthria present. Difficult to obtain full neurologic exam due to patient participation. Weakness bilateral upper extremities left greater than right right side appears to be largely participatory.   Patient  did extend at the ankle bilaterally with intact strength.    Labs on Admission: I have personally reviewed following labs and imaging studies  CBC: Recent Labs  Lab 08/04/22 1005 08/04/22 1012 08/04/22 1014  WBC 4.4  --   --   HGB 8.3* 9.5* 9.5*  HCT 28.4* 28.0* 28.0*  MCV 77.8*  --   --   PLT 288  --   --     Basic Metabolic Panel: Recent Labs  Lab 08/04/22 1005 08/04/22 1012 08/04/22 1014  NA 139 142 141  K 3.8 3.8 3.8  CL 106 104  --   CO2 24  --   --   GLUCOSE 105* 100*  --   BUN 13 18  --   CREATININE 0.73 0.80  --   CALCIUM 8.8*  --   --     GFR: Estimated Creatinine Clearance: 90.4 mL/min (by C-G formula based on SCr of 0.8 mg/dL).  Liver Function Tests: Recent Labs  Lab 08/04/22 1005  AST 17  ALT 13  ALKPHOS 50  BILITOT 0.2*  PROT 6.3*  ALBUMIN 3.1*    Urine analysis:    Component Value Date/Time   COLORURINE YELLOW 06/30/2022 1518   APPEARANCEUR HAZY (A) 06/30/2022 1518   LABSPEC 1.020 06/30/2022 1518   PHURINE 5.0 06/30/2022 1518   GLUCOSEU NEGATIVE 06/30/2022 1518   HGBUR NEGATIVE 06/30/2022 1518   BILIRUBINUR NEGATIVE 06/30/2022 1518   KETONESUR NEGATIVE 06/30/2022 1518    PROTEINUR NEGATIVE 06/30/2022 1518   UROBILINOGEN 0.2 10/06/2014 0856   NITRITE NEGATIVE 06/30/2022 1518   LEUKOCYTESUR NEGATIVE 06/30/2022 1518    Radiological Exams on Admission: DG Chest Port 1 View  Result Date: 08/04/2022 CLINICAL DATA:  Altered mental status.  Left-sided weakness EXAM: PORTABLE CHEST 1 VIEW COMPARISON:  04/12/2022 x-ray FINDINGS: Enlarged cardiopericardial silhouette with vascular congestion. Widening of the mediastinum is stable. Underinflation. No pneumothorax, effusion. Overlapping cardiac leads. Degenerative changes are noted along the spine and left shoulder IMPRESSION: Enlarged cardiopericardial silhouette with increasing vascular congestion. Underinflation. Electronically Signed   By: Jill Side M.D.   On: 08/04/2022 12:52   MR BRAIN WO CONTRAST  Result Date: 08/04/2022 CLINICAL DATA:  Stroke, follow up EXAM: MRI HEAD WITHOUT CONTRAST MRA HEAD WITHOUT CONTRAST TECHNIQUE: Multiplanar, multi-echo pulse sequences of the brain and surrounding structures were acquired without intravenous contrast. Angiographic images of the Circle of Willis were acquired using MRA technique without intravenous contrast. COMPARISON:  CT head from the same day. FINDINGS: MRI HEAD FINDINGS Brain: Remote right PCA territory infarct. Remote left parietal cortical infarct. Possible remote right pontine infarct. Patchy T2/FLAIR hyperintensities in the white matter, compatible with chronic microvascular ischemic disease. No evidence of acute infarct, acute hemorrhage, mass lesion, midline shift or hydrocephalus. Vascular: Major arterial flow voids are maintained at the skull base. Skull and upper cervical spine: Normal marrow signal. Sinuses/Orbits: Largely clear sinuses.  No acute orbital findings. Other: No mastoid effusions. MRA HEAD FINDINGS Motion limited MRA.  Within this limitation: Anterior circulation: Severe proximal right M2 MCA stenosis. Otherwise, bilateral intracranial ICAs, MCAs, and  ACAs are patent without proximal high-grade stenosis. Posterior circulation: Bilateral intradural vertebral arteries, basilar artery and bilateral posterior cerebral arteries are patent. Mild to moderate stenosis of the right intradural vertebral artery and right P2 PCA. Occluded distal right PCA in the region of known remote infarct. IMPRESSION: MRI: 1. No evidence of acute intracranial abnormality. 2. Remote infarcts, described above. MRA: 1. No emergent large vessel occlusion. 2. Severe  right proximal M2 MCA stenosis. 3. Occluded distal right PCA in the region of known remote infarct. Electronically Signed   By: Margaretha Sheffield M.D.   On: 08/04/2022 12:37   MR ANGIO HEAD WO CONTRAST  Result Date: 08/04/2022 CLINICAL DATA:  Stroke, follow up EXAM: MRI HEAD WITHOUT CONTRAST MRA HEAD WITHOUT CONTRAST TECHNIQUE: Multiplanar, multi-echo pulse sequences of the brain and surrounding structures were acquired without intravenous contrast. Angiographic images of the Circle of Willis were acquired using MRA technique without intravenous contrast. COMPARISON:  CT head from the same day. FINDINGS: MRI HEAD FINDINGS Brain: Remote right PCA territory infarct. Remote left parietal cortical infarct. Possible remote right pontine infarct. Patchy T2/FLAIR hyperintensities in the white matter, compatible with chronic microvascular ischemic disease. No evidence of acute infarct, acute hemorrhage, mass lesion, midline shift or hydrocephalus. Vascular: Major arterial flow voids are maintained at the skull base. Skull and upper cervical spine: Normal marrow signal. Sinuses/Orbits: Largely clear sinuses.  No acute orbital findings. Other: No mastoid effusions. MRA HEAD FINDINGS Motion limited MRA.  Within this limitation: Anterior circulation: Severe proximal right M2 MCA stenosis. Otherwise, bilateral intracranial ICAs, MCAs, and ACAs are patent without proximal high-grade stenosis. Posterior circulation: Bilateral intradural  vertebral arteries, basilar artery and bilateral posterior cerebral arteries are patent. Mild to moderate stenosis of the right intradural vertebral artery and right P2 PCA. Occluded distal right PCA in the region of known remote infarct. IMPRESSION: MRI: 1. No evidence of acute intracranial abnormality. 2. Remote infarcts, described above. MRA: 1. No emergent large vessel occlusion. 2. Severe right proximal M2 MCA stenosis. 3. Occluded distal right PCA in the region of known remote infarct. Electronically Signed   By: Margaretha Sheffield M.D.   On: 08/04/2022 12:37   CT ANGIO HEAD NECK W WO CM W PERF (CODE STROKE)  Result Date: 08/04/2022 CLINICAL DATA:  Neuro deficit, acute, stroke suspected. Increased left-sided weakness. History of stroke with baseline left-sided weakness and slurred speech. EXAM: CT ANGIOGRAPHY HEAD AND NECK CT PERFUSION BRAIN TECHNIQUE: Multidetector CT imaging of the head and neck was performed using the standard protocol during bolus administration of intravenous contrast. Multiplanar CT image reconstructions and MIPs were obtained to evaluate the vascular anatomy. Carotid stenosis measurements (when applicable) are obtained utilizing NASCET criteria, using the distal internal carotid diameter as the denominator. Multiphase CT imaging of the brain was performed following IV bolus contrast injection. Subsequent parametric perfusion maps were calculated using RAPID software. RADIATION DOSE REDUCTION: This exam was performed according to the departmental dose-optimization program which includes automated exposure control, adjustment of the mA and/or kV according to patient size and/or use of iterative reconstruction technique. CONTRAST:  174m OMNIPAQUE IOHEXOL 350 MG/ML SOLN COMPARISON:  Head and neck CTA and CT perfusion 05/31/2022 FINDINGS: CTA NECK FINDINGS Aortic arch: Standard 3 vessel aortic arch. Poor assessment of the arch vessel origins due to streak artifact from venous contrast  bolus. Right carotid system: Patent with similar appearance of an approximately 70% stenosis of the proximal ICA compared to the prior study. Retropharyngeal course of the proximal ICA. Left carotid system: Patent without evidence of a significant stenosis or dissection. Retropharyngeal course of the proximal ICA. Vertebral arteries: Patent without evidence of a dissection or high-grade stenosis in the neck. Mildly dominant right vertebral artery. Skeleton: No suspicious osseous lesion. Other neck: No evidence of cervical lymphadenopathy or mass. Upper chest: Clear lung apices. Review of the MIP images confirms the above findings CTA HEAD FINDINGS Assessment is limited by  early contrast timing with suboptimal arterial opacification. Anterior circulation: The internal carotid arteries are patent from skull base to carotid termini without evidence of a high-grade stenosis on the right. Apparent severe stenoses of the distal cavernous and proximal supraclinoid segments of the left ICA appear new/increased from the prior CTA and may be at least partly technical in nature although true interval progression is also possible. The MCAs are patent through the bifurcations, however MCA branch vessels are not well evaluated. An apparent severe stenosis of the distal right M1 segment may reflect true interval worsening of a previously moderate stenosis, or the apparent change may be technical on today's study. The A1 segments are grossly patent, however the A2 and more distal ACAs are poorly evaluated with chronic segmental occlusions and stenoses shown on the prior CTA. No aneurysm is identified. Posterior circulation: The intracranial vertebral arteries and basilar artery are patent to the basilar with apparent severe multifocal narrowing of the left greater than right V4 segments and proximal and mid basilar artery reflecting a change from the prior CTA and likely being at least partly technical in nature although new true  underlying stenoses are not excluded. The PCAs are poorly evaluated but are grossly patent proximally. No aneurysm is identified. Venous sinuses: Not evaluated due to early arterial contrast timing. Anatomic variants: None. Review of the MIP images confirms the above findings CT Brain Perfusion Findings: ASPECTS: 10 CBF (<30%) Volume: 0 mL Perfusion (Tmax>6.0s) volume: 0 mL Mismatch Volume: 0 mL Infarction Location: None These results were communicated to Dr. Erlinda Hong at 10:43 am on 08/04/2022 by text page via the [facility] messaging system with subsequent telephone discussion. IMPRESSION: 1. Limited assessment of the intracranial arteries. No emergent large vessel occlusion through the MCA bifurcations. 2. Chronically advanced intracranial atherosclerosis with apparent new or worsening severe anterior and posterior circulation stenoses as described above which are likely at least partly technical/artifactual. 3. Unchanged 70% proximal right ICA stenosis. 4. Negative CTP. Electronically Signed   By: Logan Bores M.D.   On: 08/04/2022 11:08   CT Head Wo Contrast  Result Date: 08/04/2022 CLINICAL DATA:  Mental status change, unknown cause. Increased left-sided weakness. History of stroke with baseline left-sided weakness and slurred speech. EXAM: CT HEAD WITHOUT CONTRAST TECHNIQUE: Contiguous axial images were obtained from the base of the skull through the vertex without intravenous contrast. RADIATION DOSE REDUCTION: This exam was performed according to the departmental dose-optimization program which includes automated exposure control, adjustment of the mA and/or kV according to patient size and/or use of iterative reconstruction technique. COMPARISON:  Head CT 06/30/2022 and MRI 05/01/2022 FINDINGS: Brain: There is no evidence of an acute infarct, intracranial hemorrhage, mass, midline shift, or extra-axial fluid collection. Chronic infarcts are again noted in the left parietal lobe, right occipital lobe, and right  cerebellum/middle cerebellar peduncle. Mild hypodensities elsewhere in the cerebral white matter bilaterally are unchanged and nonspecific but compatible with chronic small vessel ischemia. There is mild cerebral atrophy. Vascular: No hyperdense vessel. Skull: No acute fracture or suspicious osseous lesion. Sinuses/Orbits: Remote medial right orbital fracture. Small mucous retention cysts in the maxillary sinuses. Clear mastoid air cells. Unremarkable orbits. Other: None. These results were communicated to Dr. Erlinda Hong at 10:27 am on 08/04/2022 by text page via the [facility] messaging system. IMPRESSION: 1. No evidence of acute intracranial abnormality.  ASPECTS of 10. 2. Chronic infarcts as above. Electronically Signed   By: Logan Bores M.D.   On: 08/04/2022 10:27    EKG: Independently  reviewed.  Sinus rhythm at 75 bpm.  Nonspecific T wave flattening.  Assessment/Plan Active Problems:   Bipolar disorder (HCC)   Polysubstance abuse (HCC)   Type 2 diabetes mellitus ([place])   Hypertension associated with diabetes ([place])   Depression   Microcytic anemia   History of stroke   Class 1 obesity   Acute encephalopathy   Acute encephalopathy History of CVA > Patient with known history of stroke with residual left-sided weakness and dysarthria.  Presenting with worsening weakness and possible worsening mental status though somewhat difficult to determine due to her degree of dysarthria mumbling. > Reportedly woke up with worsening weakness on the left at 8 AM this morning.  Called EMS and they noted diffuse weakness and some difficulty obtaining history. > Workup in the ED thus far unrevealing with possible decreased flows on angiography but otherwise no emergent occlusion or stenosis that is new on CTA head and neck and or MRA.  CT head without acute normality MR brain without acute infarct. > Some interview findings and exam findings are consistent with nonorganic component.  Patient intermittently responsive and  for me was only when she was asking for something to eat.  Will answer some questions but not others. > Neurology was consulted in the ED and reportedly are considering EEG and will likely be following but this is not confirmed. - Monitor on telemetry - Appreciate neurology recommendations - Possibe EEG per neurology - Follow-up UDS and urinalysis - Check magnesium and TSH  Anemia > Acute on chronic anemia with hemoglobin 8.3 down from baseline of 9-10. > Continues to be microcytic. - Add on iron studies - Trend hemoglobin  Diabetes - SSI  Depression Bipolar disorder - Continue home Paxil and Seroquel  Polysubstance use > History of polysubstance use.  UDS pending could be playing a role in her presentation. - Follow-up UDS  Obesity - Noted  DVT prophylaxis: Lovenox Code Status:   Full Family Communication:  None on admission  Disposition Plan:   Patient is from:  Home  Anticipated DC to:  Home  Anticipated DC date:  1 to 3 days  Anticipated DC barriers: None  Consults called:  Neurology consulted in the ED Admission status:  Observation, telemetry  Severity of Illness: The appropriate patient status for this patient is OBSERVATION. Observation status is judged to be reasonable and necessary in order to provide the required intensity of service to ensure the patient's safety. The patient's presenting symptoms, physical exam findings, and initial radiographic and laboratory data in the context of their medical condition is felt to place them at decreased risk for further clinical deterioration. Furthermore, it is anticipated that the patient will be medically stable for discharge from the hospital within 2 midnights of admission.    Marcelyn Bruins MD [facility]  How to contact the [facility] Attending or Consulting provider [place] or covering provider during after hours [place], for this patient?   Check the care team in [facility] and look for a) attending/consulting [facility]  provider listed and b) the [facility] team listed Log into [email or web address] and use [place]'s universal password to access. If you do not have the password, please contact the hospital operator. Locate the [facility] provider you are looking for under [facility] and page to a number that you can be directly reached. If you still have difficulty reaching the provider, please page the [facility] (Director on Call) for the Hospitalists listed on amion for assistance.  08/04/2022, 1:27  PM    

## 2022-08-04 NOTE — ED Provider Notes (Signed)
[facility] Provider Note   CSN: [id number] Arrival date & time: 08/04/22  [id number]  An emergency department physician performed an initial assessment on this suspected stroke patient at 1000.  History  Chief Complaint  Patient presents with   Weakness    Kaitlyn Gray is a 47 y.o. female.  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  She lives alone and called EMS.  She reports that she had onset of left-sided weakness at 8 AM.  Prehospital, EMS reports that she had diffuse weakness and was very difficult to get history from.  Here in the ED, she states that she had onset of weakness at 8 AM.  She is mumbling and does not give clear indication of which side effect of Prehospital blood sugar noted to be 160 Reexam patient states that she awoke with symptoms at 8 AM and does not know when she went to bed Home Medications Prior to Admission medications   Medication Sig Start Date End Date Taking? Authorizing Provider  amLODipine (NORVASC) 10 MG tablet Take 1 tablet (10 mg total) by mouth daily. 06/04/22 09/02/22  [place] Donnamarie Poag, DO  aspirin EC 81 MG tablet Take 1 tablet (81 mg total) by mouth daily. Swallow whole. 06/05/22 09/03/22  [place] Eric J, DO  clopidogrel (PLAVIX) 75 MG tablet Take 1 tablet (75 mg total) by mouth daily. 06/04/22 09/02/22  [place] Donnamarie Poag, DO  ferrous sulfate 325 (65 FE) MG tablet Take 1 tablet (325 mg total) by mouth every other day. 06/04/22 09/02/22  [place] Donnamarie Poag, DO  gabapentin (NEURONTIN) 300 MG capsule Take 1 capsule (300 mg total) by mouth at bedtime. 06/04/22 09/02/22  [place] Donnamarie Poag, DO  losartan (COZAAR) 100 MG tablet Take 1 tablet (100 mg total) by mouth  daily. 06/04/22 09/02/22  [place] Donnamarie Poag, DO  metFORMIN (GLUCOPHAGE) 500 MG tablet Take 1 tablet (500 mg total) by mouth 2 (two) times daily with a meal. 06/04/22 09/02/22  [place] Eric J, DO  pantoprazole (PROTONIX) 40 MG tablet Take 1 tablet (40 mg total) by mouth daily. 06/04/22 09/02/22  [place] Donnamarie Poag, DO  PARoxetine (PAXIL) 20 MG tablet Take 1 tablet (20 mg total) by mouth daily. 07/17/22   Dorna Mai, MD  QUEtiapine (SEROQUEL) 200 MG tablet Take 1 tablet (200 mg total) by mouth at bedtime. 06/04/22 09/02/22  [place] Donnamarie Poag, DO  rosuvastatin (CRESTOR) 40 MG tablet Take 1 tablet (40 mg total) by mouth daily. 06/04/22 09/02/22  [place] Eric J, DO      Allergies    Hydrocodone and Latex    Review of Systems   Review of Systems  Physical Exam Updated Vital Signs BP (!) 158/89 (BP Location: Left Arm)   Pulse 60   Temp (!) 97.4 F (36.3 C) (Oral)   Resp 16   Wt 82.7 kg   LMP  (LMP Unknown) Comment: ams  SpO2 100%   BMI 31.30 kg/m  Physical Exam Vitals reviewed.  Constitutional:      Appearance: She is obese. She is ill-appearing.  HENT:     Head: Normocephalic.     Right Ear: External ear normal.     Left Ear: External ear normal.     Nose: Nose normal.  Mouth/Throat:     Pharynx: Oropharynx is clear.  Eyes:     Extraocular Movements: Extraocular movements intact.     Pupils: Pupils are equal, round, and reactive to light.  Cardiovascular:     Rate and Rhythm: Normal rate and regular rhythm.     Pulses: Normal pulses.  Pulmonary:     Effort: Pulmonary effort is normal.  Abdominal:     General: Bowel sounds are normal.     Palpations: Abdomen is soft.  Musculoskeletal:        General: No swelling, tenderness or signs of injury.     Cervical back: Normal range of motion.     Comments: Patient does not actively move any of her extremities and they appear flaccid on exam There is no point tenderness I am able to fully range all of her extremities  Skin:     General: Skin is warm and dry.     Capillary Refill: Capillary refill takes less than 2 seconds.  Neurological:     Comments: Patient does not lift any of her extremities against gravity.  I am able to lift her legs and they both fall back to the bed against gravity Able to lift both of her arms over her face and both fall back to the bed but did not hit her face Patient has some dysphagia with slurring of words     ED Results / Procedures / Treatments   Labs (all labs ordered are listed, but only abnormal results are displayed) Labs Reviewed  CBC - Abnormal; Notable for the following components:      Result Value   RBC 3.65 (*)    Hemoglobin 8.3 (*)    HCT 28.4 (*)    MCV 77.8 (*)    MCH 22.7 (*)    MCHC 29.2 (*)    RDW 17.9 (*)    All other components within normal limits  COMPREHENSIVE METABOLIC PANEL - Abnormal; Notable for the following components:   Glucose, Bld 105 (*)    Calcium 8.8 (*)    Total Protein 6.3 (*)    Albumin 3.1 (*)    Total Bilirubin 0.2 (*)    All other components within normal limits  I-STAT CHEM 8, ED - Abnormal; Notable for the following components:   Glucose, Bld 100 (*)    Hemoglobin 9.5 (*)    HCT 28.0 (*)    All other components within normal limits  I-STAT VENOUS BLOOD GAS, ED - Abnormal; Notable for the following components:   pH, Ven 7.479 (*)    pCO2, Ven 37.1 (*)    Acid-Base Excess 4.0 (*)    Calcium, Ion 1.14 (*)    HCT 28.0 (*)    Hemoglobin 9.5 (*)    All other components within normal limits  ETHANOL  RAPID URINE DRUG SCREEN, HOSP PERFORMED  URINALYSIS, W/ REFLEX TO CULTURE (INFECTION SUSPECTED)  BLOOD GAS, VENOUS    EKG EKG Interpretation  Date/Time:  Monday August 04 2022 09:50:04 EST Ventricular Rate:  75 PR Interval:  131 QRS Duration: 86 QT Interval:  466 QTC Calculation: 521 R Axis:   57 Text Interpretation: Sinus rhythm RSR' in V1 or V2, probably normal variant Confirmed by Pattricia Boss [phone]) on 08/04/2022  9:53:19 AM  Radiology MR BRAIN WO CONTRAST  Result Date: 08/04/2022 CLINICAL DATA:  Stroke, follow up EXAM: MRI HEAD WITHOUT CONTRAST MRA HEAD WITHOUT CONTRAST TECHNIQUE: Multiplanar, multi-echo pulse sequences of the brain and surrounding structures were acquired  without intravenous contrast. Angiographic images of the Circle of Willis were acquired using MRA technique without intravenous contrast. COMPARISON:  CT head from the same day. FINDINGS: MRI HEAD FINDINGS Brain: Remote right PCA territory infarct. Remote left parietal cortical infarct. Possible remote right pontine infarct. Patchy T2/FLAIR hyperintensities in the white matter, compatible with chronic microvascular ischemic disease. No evidence of acute infarct, acute hemorrhage, mass lesion, midline shift or hydrocephalus. Vascular: Major arterial flow voids are maintained at the skull base. Skull and upper cervical spine: Normal marrow signal. Sinuses/Orbits: Largely clear sinuses.  No acute orbital findings. Other: No mastoid effusions. MRA HEAD FINDINGS Motion limited MRA.  Within this limitation: Anterior circulation: Severe proximal right M2 MCA stenosis. Otherwise, bilateral intracranial ICAs, MCAs, and ACAs are patent without proximal high-grade stenosis. Posterior circulation: Bilateral intradural vertebral arteries, basilar artery and bilateral posterior cerebral arteries are patent. Mild to moderate stenosis of the right intradural vertebral artery and right P2 PCA. Occluded distal right PCA in the region of known remote infarct. IMPRESSION: MRI: 1. No evidence of acute intracranial abnormality. 2. Remote infarcts, described above. MRA: 1. No emergent large vessel occlusion. 2. Severe right proximal M2 MCA stenosis. 3. Occluded distal right PCA in the region of known remote infarct. Electronically Signed   By: Margaretha Sheffield M.D.   On: 08/04/2022 12:37   MR ANGIO HEAD WO CONTRAST  Result Date: 08/04/2022 CLINICAL DATA:  Stroke,  follow up EXAM: MRI HEAD WITHOUT CONTRAST MRA HEAD WITHOUT CONTRAST TECHNIQUE: Multiplanar, multi-echo pulse sequences of the brain and surrounding structures were acquired without intravenous contrast. Angiographic images of the Circle of Willis were acquired using MRA technique without intravenous contrast. COMPARISON:  CT head from the same day. FINDINGS: MRI HEAD FINDINGS Brain: Remote right PCA territory infarct. Remote left parietal cortical infarct. Possible remote right pontine infarct. Patchy T2/FLAIR hyperintensities in the white matter, compatible with chronic microvascular ischemic disease. No evidence of acute infarct, acute hemorrhage, mass lesion, midline shift or hydrocephalus. Vascular: Major arterial flow voids are maintained at the skull base. Skull and upper cervical spine: Normal marrow signal. Sinuses/Orbits: Largely clear sinuses.  No acute orbital findings. Other: No mastoid effusions. MRA HEAD FINDINGS Motion limited MRA.  Within this limitation: Anterior circulation: Severe proximal right M2 MCA stenosis. Otherwise, bilateral intracranial ICAs, MCAs, and ACAs are patent without proximal high-grade stenosis. Posterior circulation: Bilateral intradural vertebral arteries, basilar artery and bilateral posterior cerebral arteries are patent. Mild to moderate stenosis of the right intradural vertebral artery and right P2 PCA. Occluded distal right PCA in the region of known remote infarct. IMPRESSION: MRI: 1. No evidence of acute intracranial abnormality. 2. Remote infarcts, described above. MRA: 1. No emergent large vessel occlusion. 2. Severe right proximal M2 MCA stenosis. 3. Occluded distal right PCA in the region of known remote infarct. Electronically Signed   By: Margaretha Sheffield M.D.   On: 08/04/2022 12:37   CT ANGIO HEAD NECK W WO CM W PERF (CODE STROKE)  Result Date: 08/04/2022 CLINICAL DATA:  Neuro deficit, acute, stroke suspected. Increased left-sided weakness. History of stroke  with baseline left-sided weakness and slurred speech. EXAM: CT ANGIOGRAPHY HEAD AND NECK CT PERFUSION BRAIN TECHNIQUE: Multidetector CT imaging of the head and neck was performed using the standard protocol during bolus administration of intravenous contrast. Multiplanar CT image reconstructions and MIPs were obtained to evaluate the vascular anatomy. Carotid stenosis measurements (when applicable) are obtained utilizing NASCET criteria, using the distal internal carotid diameter as the denominator. Multiphase  CT imaging of the brain was performed following IV bolus contrast injection. Subsequent parametric perfusion maps were calculated using RAPID software. RADIATION DOSE REDUCTION: This exam was performed according to the departmental dose-optimization program which includes automated exposure control, adjustment of the mA and/or kV according to patient size and/or use of iterative reconstruction technique. CONTRAST:  157m OMNIPAQUE IOHEXOL 350 MG/ML SOLN COMPARISON:  Head and neck CTA and CT perfusion 05/31/2022 FINDINGS: CTA NECK FINDINGS Aortic arch: Standard 3 vessel aortic arch. Poor assessment of the arch vessel origins due to streak artifact from venous contrast bolus. Right carotid system: Patent with similar appearance of an approximately 70% stenosis of the proximal ICA compared to the prior study. Retropharyngeal course of the proximal ICA. Left carotid system: Patent without evidence of a significant stenosis or dissection. Retropharyngeal course of the proximal ICA. Vertebral arteries: Patent without evidence of a dissection or high-grade stenosis in the neck. Mildly dominant right vertebral artery. Skeleton: No suspicious osseous lesion. Other neck: No evidence of cervical lymphadenopathy or mass. Upper chest: Clear lung apices. Review of the MIP images confirms the above findings CTA HEAD FINDINGS Assessment is limited by early contrast timing with suboptimal arterial opacification. Anterior  circulation: The internal carotid arteries are patent from skull base to carotid termini without evidence of a high-grade stenosis on the right. Apparent severe stenoses of the distal cavernous and proximal supraclinoid segments of the left ICA appear new/increased from the prior CTA and may be at least partly technical in nature although true interval progression is also possible. The MCAs are patent through the bifurcations, however MCA branch vessels are not well evaluated. An apparent severe stenosis of the distal right M1 segment may reflect true interval worsening of a previously moderate stenosis, or the apparent change may be technical on today's study. The A1 segments are grossly patent, however the A2 and more distal ACAs are poorly evaluated with chronic segmental occlusions and stenoses shown on the prior CTA. No aneurysm is identified. Posterior circulation: The intracranial vertebral arteries and basilar artery are patent to the basilar with apparent severe multifocal narrowing of the left greater than right V4 segments and proximal and mid basilar artery reflecting a change from the prior CTA and likely being at least partly technical in nature although new true underlying stenoses are not excluded. The PCAs are poorly evaluated but are grossly patent proximally. No aneurysm is identified. Venous sinuses: Not evaluated due to early arterial contrast timing. Anatomic variants: None. Review of the MIP images confirms the above findings CT Brain Perfusion Findings: ASPECTS: 10 CBF (<30%) Volume: 0 mL Perfusion (Tmax>6.0s) volume: 0 mL Mismatch Volume: 0 mL Infarction Location: None These results were communicated to Dr. XErlinda Hongat 10:43 am on 08/04/2022 by text page via the [facility] system with subsequent telephone discussion. IMPRESSION: 1. Limited assessment of the intracranial arteries. No emergent large vessel occlusion through the MCA bifurcations. 2. Chronically advanced intracranial  atherosclerosis with apparent new or worsening severe anterior and posterior circulation stenoses as described above which are likely at least partly technical/artifactual. 3. Unchanged 70% proximal right ICA stenosis. 4. Negative CTP. Electronically Signed   By: ALogan BoresM.D.   On: 08/04/2022 11:08   CT Head Wo Contrast  Result Date: 08/04/2022 CLINICAL DATA:  Mental status change, unknown cause. Increased left-sided weakness. History of stroke with baseline left-sided weakness and slurred speech. EXAM: CT HEAD WITHOUT CONTRAST TECHNIQUE: Contiguous axial images were obtained from the base of the skull through the  vertex without intravenous contrast. RADIATION DOSE REDUCTION: This exam was performed according to the departmental dose-optimization program which includes automated exposure control, adjustment of the mA and/or kV according to patient size and/or use of iterative reconstruction technique. COMPARISON:  Head CT 06/30/2022 and MRI 05/01/2022 FINDINGS: Brain: There is no evidence of an acute infarct, intracranial hemorrhage, mass, midline shift, or extra-axial fluid collection. Chronic infarcts are again noted in the left parietal lobe, right occipital lobe, and right cerebellum/middle cerebellar peduncle. Mild hypodensities elsewhere in the cerebral white matter bilaterally are unchanged and nonspecific but compatible with chronic small vessel ischemia. There is mild cerebral atrophy. Vascular: No hyperdense vessel. Skull: No acute fracture or suspicious osseous lesion. Sinuses/Orbits: Remote medial right orbital fracture. Small mucous retention cysts in the maxillary sinuses. Clear mastoid air cells. Unremarkable orbits. Other: None. These results were communicated to Dr. Erlinda Hong at 10:27 am on 08/04/2022 by text page via the [facility] messaging system. IMPRESSION: 1. No evidence of acute intracranial abnormality.  ASPECTS of 10. 2. Chronic infarcts as above. Electronically Signed   By: Logan Bores M.D.    On: 08/04/2022 10:27    Procedures .Critical Care  Performed by: Pattricia Boss, MD Authorized by: Pattricia Boss, MD   Critical care provider statement:    Critical care time (minutes):  60   Critical care was necessary to treat or prevent imminent or life-threatening deterioration of the following conditions:  CNS failure or compromise   Critical care was time spent personally by me on the following activities:  Development of treatment plan with patient or surrogate, discussions with consultants, evaluation of patient's response to treatment, examination of patient, ordering and review of laboratory studies, ordering and review of radiographic studies, ordering and performing treatments and interventions, pulse oximetry, re-evaluation of patient's condition and review of old charts     Medications Ordered in ED Medications  iohexol (OMNIPAQUE) 350 MG/ML injection 100 mL (100 mLs Intravenous Contrast Given 08/04/22 1028)    ED Course/ Medical Decision Making/ A&P Clinical Course as of 08/04/22 1253  Mon Aug 04, 2022  1045 Anemia with hemoglobin of 8.3 which has decreased since first prior hemoglobin of 9 [DR]  1047 CT head reviewed interpreted no evidence of acute bleeding or acute infarct noted on my evaluation and radiologist evaluation notes chronic infarcts [DR]    Clinical Course User Index [DR] Pattricia Boss, MD                             Medical Decision Making Amount and/or Complexity of Data Reviewed Labs: ordered. Radiology: ordered.  This patient presents to the ED for concern of weakness, this involves an extensive number of treatment options, and is a complaint that carries with it a high risk of complications and morbidity.  The differential diagnosis includes stroke, metabolic abnormalities including hypoglycemia hypo and hypernatremia hypokalemia, infection,   Co morbidities that complicate the patient evaluation  History of prior stroke History of  polysubstance abuse    Additional history obtained:  Additional history obtained from review of prior ED and inpatient records External records from outside source obtained and reviewed including patient had stroke and admission in October 2023   Lab Tests:  I Ordered, and personally interpreted labs.  The pertinent results include: Venous blood gas, pleat metabolic panel, alcohol less than 10, CBC with hemoglobin of 8 ongoing stable anemia   Imaging Studies ordered:  I ordered imaging studies including CT  angio head and neck and plain CT CT neck with apparent worsening of anterior and posterior circulation with MRI without acute stroke  I independently visualized and interpreted imaging which showed Discussed with Dr. Erlinda Hong I agree with the radiologist interpretation   Cardiac Monitoring: / EKG:  The patient was maintained on a cardiac monitor.  I personally viewed and interpreted the cardiac monitored which showed an underlying rhythm of: nsr   Consultations Obtained:  I requested consultation with the Dr. Erlinda Hong,  and discussed lab and imaging findings as well as pertinent plan - they recommend: admission to hospitailst   Problem List / ED Course / Critical interventions / Medication management  AMS acute stroke vs acute metabolic abnormality vs  I ordered medication including none  stayed the same I have reviewed the patients home medicines and have made adjustments as needed   Social Determinants of Health:  Stroke, substance abuse   Test / Admission - Considered:  CT, CTA done MR MR angio done per neuro- Dr. Erlinda Hong reports no stroke Labs and uds ordered. ETOH normal HGB anemia - stable No acute metabolic lab abnormalities or new normal glucose, sodium, potassium. Neurology is considering EEG Plan admission to hospitalist service for further evaluation and treatment of altered mental status Patient remains hemodynamically stable and will arouse to verbal stimuli and  appears to have good control of her airway at this time.        Final Clinical Impression(s) / ED Diagnoses Final diagnoses:  Weakness  Altered mental status, unspecified altered mental status type    Rx / DC Orders ED Discharge Orders     None         Pattricia Boss, MD 08/04/22 1254

## 2022-08-04 NOTE — Consult Note (Signed)
Stroke Neurology Consultation Note  Consult Requested by: Dr. Jeanell Sparrow  Reason for Consult: code stroke  Consult Date: 08/04/22   The history was obtained from the Dr Jeanell Sparrow and stroke response team.  During history and examination, all items were not able to obtain unless otherwise noted.  History of Present Illness:  Kaitlyn Gray is a 47 y.o. African American female with PMH of cocaine use, HTN, DM2, bipolar disorder, and previous stroke presenting with worsening weakness from baseline. Pt was not cooperative to give hx and answered everything with "I do not know". Per chart, she went to sleep last night at baseline but not sure when she went to bed. Woke up 8am today found to have weakness, reported left sided weakness but EMS found generalized weakness and hard to exam. In ED and in CT pt again seems drowsy and sleepy not cooperative on exam. She still has severe dysarthia but per note she does have baseline dysarthria. However, pt does have disconjugate gaze which is new and not documented in the past. CT no acute finding, CTA head and neck suboptimal due to bolus timing but no LVO. MRI done per wake up protocol found no acute infarct.   Hx stroke/pseudostroke 12/2019 -admitted for right-sided weakness, numbness and slurred speech.  MRI showed acute/subacute demyelination vs Subacute infarction R middle cerebral peduncle.  MRA showed Multifocal intracranial atherosclerotic disease vs vasculitis vs cocaine induced vasculopathy, including left ICA siphon moderate stenosis, left M2, right P2 and left P4 moderate to severe stenosis.  Carotid Doppler negative.  EF 55 to 60%.  LP w/ slightly elevated protein 69, WBC 4, negative oligoclonol bands.  UDS positive for cocaine.  A1c 7.5, LDL 122.  ANA positive.  Patient left AMA at that time.  Patient diagnosis most likely due to ischemic infarction. 02/09/2020 admitted for right pontine infarct. CT showed right middle cerebellar peduncle old infarct. MRI acute R  pontine infarct with evolving right cerebellar peduncle ischemic abnormality. LDL 75 and A1C 6.2. put on DAPT for 3 week then ASA alone. 08/2020 admitted for worsening weakness on the left.  Found to have extensive left-sided pain also.  MRI no acute infarct.  CT head and neck right ICA 50% stenosis, right P2 stenosis, multifocal intracranial stenosis.  LDL 102, A1c 7.3.  Patient continued on aspirin 81 03/2021 admitted again for confusion and slurred speech, with "baby voice".  MRI again no acute infarct. 10/2021 -ED visit for left-sided worsening weakness and left leg/foot pain.  CT head and neck no LVO and right ICA similar 50% stenosis.  MRI no acute infarct.  UDS positive for cocaine again.  Continued on aspirin and Lipitor 01/2022 ED visit for left-sided weakness.  MRI negative for acute infarct. 03/2022 admitted for right leg weakness.  MRI showed scattered bilateral ACA infarcts.  CTA head and neck right ICA 70% stenosis and bilateral A2/A3 stenosis, moderate stenosis of right PCA P1 and P2 with occlusion of P3.  EF 70 to 75%.  DVT negative.  LDL 77, A1c 6.6, discharged on DAPT for 3 months and then Plavix alone.  Also Crestor 40.  Educated on smoking cessation and cocaine cessation.  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  However exam concerning for psychogenic source.  EEG negative.  Discharged from ED. 02/12/2020 during admission again for unresponsiveness. EEG normal.  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    Encounter for assessment of healthcare decision-making capacity    History of cervical fracture 12/24/2017    nondisplaced fracture lateral mass of C1 on the right on CT 12/24/17   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 ([place]) 07/19/2017   Hypertension    Hypertensive emergency 05/31/2022   Ischemic stroke ([place]) 01/01/2020   subacute right middle cerebellar peduncle and pons infarction   Left-sided weakness 01/27/2022   Head CT with remote right occipital infarct which is consistent with old left-sided weakness   MDD (major depressive disorder), recurrent severe, without psychosis ([place]) 03/24/2015   Normocytic anemia 02/09/2020   Opiate use    History of Suboxone Therapy until 05/2021   Polysubstance abuse ([place]) 07/19/2017   Prescription drug misadventures ([place])     Past Surgical History:  Procedure Laterality Date   CESAREAN SECTION      Family History  Problem Relation Age of Onset   Hypertension Mother    CAD Mother 39       died of MI at age 47   Hypertension Father     Social History:  reports that she has been smoking cigarettes. She has been smoking an average of .5 packs per day. She has never used smokeless tobacco. She reports current drug use. Drugs: Marijuana and Cocaine. She reports that she does not drink alcohol.  Allergies:  Allergies  Allergen Reactions   Hydrocodone Itching   Latex Itching and Rash    No current facility-administered medications on file prior to encounter.   Current Outpatient Medications on File Prior to Encounter  Medication Sig Dispense Refill   amLODipine (NORVASC) 10 MG tablet Take 1 tablet (10 mg total) by mouth daily. 30 tablet 2   aspirin EC 81 MG tablet Take 1 tablet (81 mg total) by mouth daily. Swallow whole. 30 tablet 2   clopidogrel (PLAVIX) 75 MG tablet Take 1 tablet (75 mg total) by mouth daily. 30 tablet 2   ferrous sulfate 325 (65 FE) MG tablet Take 1 tablet (325 mg total) by mouth every other day. 15 tablet 2   gabapentin (NEURONTIN) 300 MG capsule Take 1 capsule (300 mg total) by mouth at  bedtime. 30 capsule 2   losartan (COZAAR) 100 MG tablet Take 1 tablet (100 mg total) by mouth daily. 30 tablet 2   metFORMIN (GLUCOPHAGE) 500 MG tablet Take 1 tablet (500 mg total) by mouth 2 (two) times daily with a meal. 60 tablet 2   pantoprazole (PROTONIX) 40 MG tablet Take 1 tablet (40 mg total) by mouth daily. 30 tablet 2   PARoxetine (PAXIL) 20 MG tablet Take 1 tablet (20 mg total) by mouth daily. 30 tablet 1   QUEtiapine (SEROQUEL) 200 MG tablet Take 1 tablet (200 mg total) by mouth at bedtime. 30 tablet 2   rosuvastatin (CRESTOR) 40 MG tablet Take 1 tablet (40 mg total) by mouth daily. 30 tablet 2    Review of Systems: A full ROS was attempted today and was not able to be performed given drowsiness and noncooperation.   Physical Examination: Temp:  [97.4 F (36.3 C)-98.4 F (36.9 C)] 98.4 F (36.9 C) (02/19 1455) Pulse Rate:  [57-72] 65 (02/19 1830) Resp:  [13-19]  19 (02/19 1830) BP: (139-189)/(80-99) 177/99 (02/19 1830) SpO2:  [99 %-100 %] 99 % (02/19 1830) Weight:  [82.7 kg] 82.7 kg (02/19 1042)  General - well nourished, well developed, drowsy.    Ophthalmologic - fundi not visualized due to noncooperation.    Cardiovascular - regular rhythm and rate  Neuro - drowsy sleepy but eyes open on voice, answer " I do not know" for most orientation questions but did state age 83 in very dysarthric voice. No aphasia when accuse provider for pain stimulation however, not following commands. Able to name "nose" but perseverated on "nose", did not repeat as requested. Disconjugated gaze with forced eye opening, however, not cooperative with exam, visual field testing not cooperative, not blinking to visual threat consistently. No significant facial droop. Tongue protrusion not cooperative. On pain stimulation, strongly move RUE and RLE with less movement of LUE and LLE. Sensation, coordination not cooperative and gait not tested. .    Data Reviewed: DG Chest Port 1 View  Result  Date: 08/04/2022 CLINICAL DATA:  Altered mental status.  Left-sided weakness EXAM: PORTABLE CHEST 1 VIEW COMPARISON:  04/12/2022 x-ray FINDINGS: Enlarged cardiopericardial silhouette with vascular congestion. Widening of the mediastinum is stable. Underinflation. No pneumothorax, effusion. Overlapping cardiac leads. Degenerative changes are noted along the spine and left shoulder IMPRESSION: Enlarged cardiopericardial silhouette with increasing vascular congestion. Underinflation. Electronically Signed   By: Jill Side M.D.   On: 08/04/2022 12:52   MR BRAIN WO CONTRAST  Result Date: 08/04/2022 CLINICAL DATA:  Stroke, follow up EXAM: MRI HEAD WITHOUT CONTRAST MRA HEAD WITHOUT CONTRAST TECHNIQUE: Multiplanar, multi-echo pulse sequences of the brain and surrounding structures were acquired without intravenous contrast. Angiographic images of the Circle of Willis were acquired using MRA technique without intravenous contrast. COMPARISON:  CT head from the same day. FINDINGS: MRI HEAD FINDINGS Brain: Remote right PCA territory infarct. Remote left parietal cortical infarct. Possible remote right pontine infarct. Patchy T2/FLAIR hyperintensities in the white matter, compatible with chronic microvascular ischemic disease. No evidence of acute infarct, acute hemorrhage, mass lesion, midline shift or hydrocephalus. Vascular: Major arterial flow voids are maintained at the skull base. Skull and upper cervical spine: Normal marrow signal. Sinuses/Orbits: Largely clear sinuses.  No acute orbital findings. Other: No mastoid effusions. MRA HEAD FINDINGS Motion limited MRA.  Within this limitation: Anterior circulation: Severe proximal right M2 MCA stenosis. Otherwise, bilateral intracranial ICAs, MCAs, and ACAs are patent without proximal high-grade stenosis. Posterior circulation: Bilateral intradural vertebral arteries, basilar artery and bilateral posterior cerebral arteries are patent. Mild to moderate stenosis of the  right intradural vertebral artery and right P2 PCA. Occluded distal right PCA in the region of known remote infarct. IMPRESSION: MRI: 1. No evidence of acute intracranial abnormality. 2. Remote infarcts, described above. MRA: 1. No emergent large vessel occlusion. 2. Severe right proximal M2 MCA stenosis. 3. Occluded distal right PCA in the region of known remote infarct. Electronically Signed   By: Margaretha Sheffield M.D.   On: 08/04/2022 12:37   MR ANGIO HEAD WO CONTRAST  Result Date: 08/04/2022 CLINICAL DATA:  Stroke, follow up EXAM: MRI HEAD WITHOUT CONTRAST MRA HEAD WITHOUT CONTRAST TECHNIQUE: Multiplanar, multi-echo pulse sequences of the brain and surrounding structures were acquired without intravenous contrast. Angiographic images of the Circle of Willis were acquired using MRA technique without intravenous contrast. COMPARISON:  CT head from the same day. FINDINGS: MRI HEAD FINDINGS Brain: Remote right PCA territory infarct. Remote left parietal cortical infarct. Possible remote right pontine infarct.  Patchy T2/FLAIR hyperintensities in the white matter, compatible with chronic microvascular ischemic disease. No evidence of acute infarct, acute hemorrhage, mass lesion, midline shift or hydrocephalus. Vascular: Major arterial flow voids are maintained at the skull base. Skull and upper cervical spine: Normal marrow signal. Sinuses/Orbits: Largely clear sinuses.  No acute orbital findings. Other: No mastoid effusions. MRA HEAD FINDINGS Motion limited MRA.  Within this limitation: Anterior circulation: Severe proximal right M2 MCA stenosis. Otherwise, bilateral intracranial ICAs, MCAs, and ACAs are patent without proximal high-grade stenosis. Posterior circulation: Bilateral intradural vertebral arteries, basilar artery and bilateral posterior cerebral arteries are patent. Mild to moderate stenosis of the right intradural vertebral artery and right P2 PCA. Occluded distal right PCA in the region of known  remote infarct. IMPRESSION: MRI: 1. No evidence of acute intracranial abnormality. 2. Remote infarcts, described above. MRA: 1. No emergent large vessel occlusion. 2. Severe right proximal M2 MCA stenosis. 3. Occluded distal right PCA in the region of known remote infarct. Electronically Signed   By: Margaretha Sheffield M.D.   On: 08/04/2022 12:37   CT ANGIO HEAD NECK W WO CM W PERF (CODE STROKE)  Result Date: 08/04/2022 CLINICAL DATA:  Neuro deficit, acute, stroke suspected. Increased left-sided weakness. History of stroke with baseline left-sided weakness and slurred speech. EXAM: CT ANGIOGRAPHY HEAD AND NECK CT PERFUSION BRAIN TECHNIQUE: Multidetector CT imaging of the head and neck was performed using the standard protocol during bolus administration of intravenous contrast. Multiplanar CT image reconstructions and MIPs were obtained to evaluate the vascular anatomy. Carotid stenosis measurements (when applicable) are obtained utilizing NASCET criteria, using the distal internal carotid diameter as the denominator. Multiphase CT imaging of the brain was performed following IV bolus contrast injection. Subsequent parametric perfusion maps were calculated using RAPID software. RADIATION DOSE REDUCTION: This exam was performed according to the departmental dose-optimization program which includes automated exposure control, adjustment of the mA and/or kV according to patient size and/or use of iterative reconstruction technique. CONTRAST:  144m OMNIPAQUE IOHEXOL 350 MG/ML SOLN COMPARISON:  Head and neck CTA and CT perfusion 05/31/2022 FINDINGS: CTA NECK FINDINGS Aortic arch: Standard 3 vessel aortic arch. Poor assessment of the arch vessel origins due to streak artifact from venous contrast bolus. Right carotid system: Patent with similar appearance of an approximately 70% stenosis of the proximal ICA compared to the prior study. Retropharyngeal course of the proximal ICA. Left carotid system: Patent without  evidence of a significant stenosis or dissection. Retropharyngeal course of the proximal ICA. Vertebral arteries: Patent without evidence of a dissection or high-grade stenosis in the neck. Mildly dominant right vertebral artery. Skeleton: No suspicious osseous lesion. Other neck: No evidence of cervical lymphadenopathy or mass. Upper chest: Clear lung apices. Review of the MIP images confirms the above findings CTA HEAD FINDINGS Assessment is limited by early contrast timing with suboptimal arterial opacification. Anterior circulation: The internal carotid arteries are patent from skull base to carotid termini without evidence of a high-grade stenosis on the right. Apparent severe stenoses of the distal cavernous and proximal supraclinoid segments of the left ICA appear new/increased from the prior CTA and may be at least partly technical in nature although true interval progression is also possible. The MCAs are patent through the bifurcations, however MCA branch vessels are not well evaluated. An apparent severe stenosis of the distal right M1 segment may reflect true interval worsening of a previously moderate stenosis, or the apparent change may be technical on today's study. The A1 segments are  grossly patent, however the A2 and more distal ACAs are poorly evaluated with chronic segmental occlusions and stenoses shown on the prior CTA. No aneurysm is identified. Posterior circulation: The intracranial vertebral arteries and basilar artery are patent to the basilar with apparent severe multifocal narrowing of the left greater than right V4 segments and proximal and mid basilar artery reflecting a change from the prior CTA and likely being at least partly technical in nature although new true underlying stenoses are not excluded. The PCAs are poorly evaluated but are grossly patent proximally. No aneurysm is identified. Venous sinuses: Not evaluated due to early arterial contrast timing. Anatomic variants: None.  Review of the MIP images confirms the above findings CT Brain Perfusion Findings: ASPECTS: 10 CBF (<30%) Volume: 0 mL Perfusion (Tmax>6.0s) volume: 0 mL Mismatch Volume: 0 mL Infarction Location: None These results were communicated to Dr. Erlinda Hong at 10:43 am on 08/04/2022 by text page via the [facility] messaging system with subsequent telephone discussion. IMPRESSION: 1. Limited assessment of the intracranial arteries. No emergent large vessel occlusion through the MCA bifurcations. 2. Chronically advanced intracranial atherosclerosis with apparent new or worsening severe anterior and posterior circulation stenoses as described above which are likely at least partly technical/artifactual. 3. Unchanged 70% proximal right ICA stenosis. 4. Negative CTP. Electronically Signed   By: Logan Bores M.D.   On: 08/04/2022 11:08   CT Head Wo Contrast  Result Date: 08/04/2022 CLINICAL DATA:  Mental status change, unknown cause. Increased left-sided weakness. History of stroke with baseline left-sided weakness and slurred speech. EXAM: CT HEAD WITHOUT CONTRAST TECHNIQUE: Contiguous axial images were obtained from the base of the skull through the vertex without intravenous contrast. RADIATION DOSE REDUCTION: This exam was performed according to the departmental dose-optimization program which includes automated exposure control, adjustment of the mA and/or kV according to patient size and/or use of iterative reconstruction technique. COMPARISON:  Head CT 06/30/2022 and MRI 05/01/2022 FINDINGS: Brain: There is no evidence of an acute infarct, intracranial hemorrhage, mass, midline shift, or extra-axial fluid collection. Chronic infarcts are again noted in the left parietal lobe, right occipital lobe, and right cerebellum/middle cerebellar peduncle. Mild hypodensities elsewhere in the cerebral white matter bilaterally are unchanged and nonspecific but compatible with chronic small vessel ischemia. There is mild cerebral atrophy.  Vascular: No hyperdense vessel. Skull: No acute fracture or suspicious osseous lesion. Sinuses/Orbits: Remote medial right orbital fracture. Small mucous retention cysts in the maxillary sinuses. Clear mastoid air cells. Unremarkable orbits. Other: None. These results were communicated to Dr. Erlinda Hong at 10:27 am on 08/04/2022 by text page via the [facility] messaging system. IMPRESSION: 1. No evidence of acute intracranial abnormality.  ASPECTS of 10. 2. Chronic infarcts as above. Electronically Signed   By: Logan Bores M.D.   On: 08/04/2022 10:27    Assessment: 47 y.o. female PMH of cocaine use, HTN, DM2, bipolar disorder, and previous stroke/pseudostroke/pseudoseizure presenting with worsening weakness from baseline. Pt was not cooperative to give hx or exam. LSW last night before bed. CT no acute finding, CTA head and neck suboptimal due to bolus timing but no LVO. MRI done per wake up protocol found no acute infarct. However, pt does have disconjugated gaze on exam which was not documented before, concerning for encephalopathy. Recommend further encephalopathy per medical team. Noted again urine positive for cocaine. If no improvement after management, can consider EEG.   Stroke Risk Factors - carotid stenosis, smoking, and cocaine and hx of stroke  Plan: Continue further encephlopathy work  up  Frequent neuro checks Telemetry monitoring Continue home meds Quit smoking and cocaine Stroke risk factor modification If no improving of mental status, will check EEG although likely low yield Discussed with Dr. Jeanell Sparrow ED physician We will follow  Thank you for this consultation and allowing [place] to participate in the care of this patient.  [name] Hawking, MD PhD Stroke Neurology 08/04/2022 7:28 PM

## 2022-08-04 NOTE — ED Notes (Signed)
Patient woke up enough to respond and say that she could provide urine specimen, then immediately fell back asleep.

## 2022-08-04 NOTE — Code Documentation (Signed)
Patient brought to MRI for WAKE UP Stroke criteria.

## 2022-08-04 NOTE — ED Notes (Signed)
ED TO INPATIENT HANDOFF REPORT  ED Nurse Name and Phone #: Altha Harm [phone]  S Name/Age/Gender Kaitlyn Gray 47 y.o. female Room/Bed: 043C/043C  Code Status   Code Status: Full Code  Home/SNF/Other Home Patient oriented to: self, place, time, and situation Is this baseline? Yes   Triage Complete: Triage complete  Chief Complaint Acute encephalopathy [G93.40]  Triage Note Patient bib [facility] from home with complaints of left sided weakness. Patient has a history of stroke with left sided weakness but worsening symptoms today. Speech is slurred but this is normal. She does have a history of poly substance abuse. VSS   Allergies Allergies  Allergen Reactions   Hydrocodone Itching   Latex Itching and Rash    Level of Care/Admitting Diagnosis ED Disposition     ED Disposition  Admit   Condition  --   Comment  Hospital Area: [place] [[id number]]  Level of Care: Telemetry Medical [104]  May place patient in observation at [facility] or [place] if equivalent level of care is available:: No  Covid Evaluation: Asymptomatic - no recent exposure (last 10 days) testing not required  Diagnosis: Acute encephalopathy [id number]  Admitting Physician: Marcelyn Bruins [id number]  Attending Physician: Marcelyn Bruins [id number]          B Medical/Surgery History Past Medical History:  Diagnosis Date   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   Bipolar 1 disorder ([place])    Cannabis use disorder, moderate, dependence ([place]) 03/24/2015   Chronic anemia    Cocaine use disorder, severe, dependence ([place]) 03/24/2015   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    Encounter for assessment of healthcare decision-making capacity    History of cervical fracture 12/24/2017   nondisplaced fracture lateral mass of C1 on the right on CT 12/24/17   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 ([place]) 07/19/2017   Hypertension     Hypertensive emergency 05/31/2022   Ischemic stroke ([place]) 01/01/2020   subacute right middle cerebellar peduncle and pons infarction   Left-sided weakness 01/27/2022   Head CT with remote right occipital infarct which is consistent with old left-sided weakness   MDD (major depressive disorder), recurrent severe, 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   Normocytic anemia 02/09/2020   Opiate use    History of Suboxone Therapy until 05/2021   Polysubstance abuse ([place]) 07/19/2017   Prescription drug misadventures ([place])    Past Surgical History:  Procedure Laterality Date   CESAREAN SECTION       A IV Location/Drains/Wounds Patient Lines/Drains/Airways Status     Active Line/Drains/Airways     Name Placement date Placement time Site Days   Peripheral IV 08/04/22 18 G Right Antecubital 08/04/22  1011  Antecubital  less than 1   External Urinary Catheter 06/02/22  1400  --  63            Intake/Output Last 24 hours No intake or output data in the 24 hours ending 08/04/22 1744  Labs/Imaging Results for orders placed or performed during the hospital encounter of 08/04/22 (from the past 48 hour(s))  Ethanol     Status: None   Collection Time: 08/04/22 10:05 AM  Result Value Ref Range   Alcohol, Ethyl (B) <10 <10 mg/dL    Comment: (NOTE) Lowest detectable limit for serum alcohol is 10 mg/dL.  For medical purposes only. Performed at [facility], [place] [address], [place], [place] [address]   CBC  Status: Abnormal   Collection Time: 08/04/22 10:05 AM  Result Value Ref Range   WBC 4.4 4.0 - 10.5 K/uL   RBC 3.65 (L) 3.87 - 5.11 MIL/uL   Hemoglobin 8.3 (L) 12.0 - 15.0 g/dL    Comment: Reticulocyte Hemoglobin testing may be clinically indicated, consider ordering this additional test [id number]    HCT 28.4 (L) 36.0 - 46.0 %   MCV 77.8 (L) 80.0 - 100.0 fL   MCH 22.7 (L) 26.0 - 34.0 pg   MCHC 29.2 (L) 30.0 - 36.0 g/dL   RDW 17.9 (H) 11.5 - 15.5 %   Platelets 288  150 - 400 K/uL   nRBC 0.0 0.0 - 0.2 %    Comment: Performed at [facility], [place] [address], [place], [place] [address]  Comprehensive metabolic panel     Status: Abnormal   Collection Time: 08/04/22 10:05 AM  Result Value Ref Range   Sodium 139 135 - 145 mmol/L   Potassium 3.8 3.5 - 5.1 mmol/L   Chloride 106 98 - 111 mmol/L   CO2 24 22 - 32 mmol/L   Glucose, Bld 105 (H) 70 - 99 mg/dL    Comment: Glucose reference range applies only to samples taken after fasting for at least 8 hours.   BUN 13 6 - 20 mg/dL   Creatinine, Ser 0.73 0.44 - 1.00 mg/dL   Calcium 8.8 (L) 8.9 - 10.3 mg/dL   Total Protein 6.3 (L) 6.5 - 8.1 g/dL   Albumin 3.1 (L) 3.5 - 5.0 g/dL   AST 17 15 - 41 U/L   ALT 13 0 - 44 U/L   Alkaline Phosphatase 50 38 - 126 U/L   Total Bilirubin 0.2 (L) 0.3 - 1.2 mg/dL   GFR, Estimated >60 >60 mL/min    Comment: (NOTE) Calculated using the CKD-EPI Creatinine Equation (2021)    Anion gap 9 5 - 15    Comment: Performed at [facility], [place] [address], [place], [place] [address]  Magnesium     Status: None   Collection Time: 08/04/22 10:05 AM  Result Value Ref Range   Magnesium 1.9 1.7 - 2.4 mg/dL    Comment: Performed at [place] [address] [place], [place] [address]  TSH     Status: None   Collection Time: 08/04/22 10:05 AM  Result Value Ref Range   TSH 0.955 0.350 - 4.500 uIU/mL    Comment: Performed by a 3rd Generation assay with a functional sensitivity of <=0.01 uIU/mL. Performed at [facility], [place] [address] [place], [place] [address]   I-stat chem 8, ED (not at [facility], [facility] or [facility])     Status: Abnormal   Collection Time: 08/04/22 10:12 AM  Result Value Ref Range   Sodium 142 135 - 145 mmol/L   Potassium 3.8 3.5 - 5.1 mmol/L   Chloride 104 98 - 111 mmol/L   BUN 18 6 - 20 mg/dL   Creatinine, Ser 0.80 0.44 - 1.00 mg/dL   Glucose, Bld 100 (H) 70 - 99 mg/dL    Comment: Glucose reference range applies only to samples taken after  fasting for at least 8 hours.   Calcium, Ion 1.15 1.15 - 1.40 mmol/L   TCO2 26 22 - 32 mmol/L   Hemoglobin 9.5 (L) 12.0 - 15.0 g/dL   HCT 28.0 (L) 36.0 - 46.0 %  I-Stat venous blood gas, ED     Status: Abnormal   Collection Time:  08/04/22 10:14 AM  Result Value Ref Range   pH, Ven 7.479 (H) 7.25 - 7.43   pCO2, Ven 37.1 (L) 44 - 60 mmHg   pO2, Ven 41 32 - 45 mmHg   Bicarbonate 27.5 20.0 - 28.0 mmol/L   TCO2 29 22 - 32 mmol/L   O2 Saturation 81 %   Acid-Base Excess 4.0 (H) 0.0 - 2.0 mmol/L   Sodium 141 135 - 145 mmol/L   Potassium 3.8 3.5 - 5.1 mmol/L   Calcium, Ion 1.14 (L) 1.15 - 1.40 mmol/L   HCT 28.0 (L) 36.0 - 46.0 %   Hemoglobin 9.5 (L) 12.0 - 15.0 g/dL   Sample type VENOUS   CBG monitoring, ED     Status: Abnormal   Collection Time: 08/04/22  3:03 PM  Result Value Ref Range   Glucose-Capillary 111 (H) 70 - 99 mg/dL    Comment: Glucose reference range applies only to samples taken after fasting for at least 8 hours.   DG Chest Port 1 View  Result Date: 08/04/2022 CLINICAL DATA:  Altered mental status.  Left-sided weakness EXAM: PORTABLE CHEST 1 VIEW COMPARISON:  04/12/2022 x-ray FINDINGS: Enlarged cardiopericardial silhouette with vascular congestion. Widening of the mediastinum is stable. Underinflation. No pneumothorax, effusion. Overlapping cardiac leads. Degenerative changes are noted along the spine and left shoulder IMPRESSION: Enlarged cardiopericardial silhouette with increasing vascular congestion. Underinflation. Electronically Signed   By: Jill Side M.D.   On: 08/04/2022 12:52   MR BRAIN WO CONTRAST  Result Date: 08/04/2022 CLINICAL DATA:  Stroke, follow up EXAM: MRI HEAD WITHOUT CONTRAST MRA HEAD WITHOUT CONTRAST TECHNIQUE: Multiplanar, multi-echo pulse sequences of the brain and surrounding structures were acquired without intravenous contrast. Angiographic images of the Circle of Willis were acquired using MRA technique without intravenous contrast. COMPARISON:   CT head from the same day. FINDINGS: MRI HEAD FINDINGS Brain: Remote right PCA territory infarct. Remote left parietal cortical infarct. Possible remote right pontine infarct. Patchy T2/FLAIR hyperintensities in the white matter, compatible with chronic microvascular ischemic disease. No evidence of acute infarct, acute hemorrhage, mass lesion, midline shift or hydrocephalus. Vascular: Major arterial flow voids are maintained at the skull base. Skull and upper cervical spine: Normal marrow signal. Sinuses/Orbits: Largely clear sinuses.  No acute orbital findings. Other: No mastoid effusions. MRA HEAD FINDINGS Motion limited MRA.  Within this limitation: Anterior circulation: Severe proximal right M2 MCA stenosis. Otherwise, bilateral intracranial ICAs, MCAs, and ACAs are patent without proximal high-grade stenosis. Posterior circulation: Bilateral intradural vertebral arteries, basilar artery and bilateral posterior cerebral arteries are patent. Mild to moderate stenosis of the right intradural vertebral artery and right P2 PCA. Occluded distal right PCA in the region of known remote infarct. IMPRESSION: MRI: 1. No evidence of acute intracranial abnormality. 2. Remote infarcts, described above. MRA: 1. No emergent large vessel occlusion. 2. Severe right proximal M2 MCA stenosis. 3. Occluded distal right PCA in the region of known remote infarct. Electronically Signed   By: Margaretha Sheffield M.D.   On: 08/04/2022 12:37   MR ANGIO HEAD WO CONTRAST  Result Date: 08/04/2022 CLINICAL DATA:  Stroke, follow up EXAM: MRI HEAD WITHOUT CONTRAST MRA HEAD WITHOUT CONTRAST TECHNIQUE: Multiplanar, multi-echo pulse sequences of the brain and surrounding structures were acquired without intravenous contrast. Angiographic images of the Circle of Willis were acquired using MRA technique without intravenous contrast. COMPARISON:  CT head from the same day. FINDINGS: MRI HEAD FINDINGS Brain: Remote right PCA territory infarct.  Remote left parietal cortical infarct.  Possible remote right pontine infarct. Patchy T2/FLAIR hyperintensities in the white matter, compatible with chronic microvascular ischemic disease. No evidence of acute infarct, acute hemorrhage, mass lesion, midline shift or hydrocephalus. Vascular: Major arterial flow voids are maintained at the skull base. Skull and upper cervical spine: Normal marrow signal. Sinuses/Orbits: Largely clear sinuses.  No acute orbital findings. Other: No mastoid effusions. MRA HEAD FINDINGS Motion limited MRA.  Within this limitation: Anterior circulation: Severe proximal right M2 MCA stenosis. Otherwise, bilateral intracranial ICAs, MCAs, and ACAs are patent without proximal high-grade stenosis. Posterior circulation: Bilateral intradural vertebral arteries, basilar artery and bilateral posterior cerebral arteries are patent. Mild to moderate stenosis of the right intradural vertebral artery and right P2 PCA. Occluded distal right PCA in the region of known remote infarct. IMPRESSION: MRI: 1. No evidence of acute intracranial abnormality. 2. Remote infarcts, described above. MRA: 1. No emergent large vessel occlusion. 2. Severe right proximal M2 MCA stenosis. 3. Occluded distal right PCA in the region of known remote infarct. Electronically Signed   By: Margaretha Sheffield M.D.   On: 08/04/2022 12:37   CT ANGIO HEAD NECK W WO CM W PERF (CODE STROKE)  Result Date: 08/04/2022 CLINICAL DATA:  Neuro deficit, acute, stroke suspected. Increased left-sided weakness. History of stroke with baseline left-sided weakness and slurred speech. EXAM: CT ANGIOGRAPHY HEAD AND NECK CT PERFUSION BRAIN TECHNIQUE: Multidetector CT imaging of the head and neck was performed using the standard protocol during bolus administration of intravenous contrast. Multiplanar CT image reconstructions and MIPs were obtained to evaluate the vascular anatomy. Carotid stenosis measurements (when applicable) are obtained  utilizing NASCET criteria, using the distal internal carotid diameter as the denominator. Multiphase CT imaging of the brain was performed following IV bolus contrast injection. Subsequent parametric perfusion maps were calculated using RAPID software. RADIATION DOSE REDUCTION: This exam was performed according to the departmental dose-optimization program which includes automated exposure control, adjustment of the mA and/or kV according to patient size and/or use of iterative reconstruction technique. CONTRAST:  144m OMNIPAQUE IOHEXOL 350 MG/ML SOLN COMPARISON:  Head and neck CTA and CT perfusion 05/31/2022 FINDINGS: CTA NECK FINDINGS Aortic arch: Standard 3 vessel aortic arch. Poor assessment of the arch vessel origins due to streak artifact from venous contrast bolus. Right carotid system: Patent with similar appearance of an approximately 70% stenosis of the proximal ICA compared to the prior study. Retropharyngeal course of the proximal ICA. Left carotid system: Patent without evidence of a significant stenosis or dissection. Retropharyngeal course of the proximal ICA. Vertebral arteries: Patent without evidence of a dissection or high-grade stenosis in the neck. Mildly dominant right vertebral artery. Skeleton: No suspicious osseous lesion. Other neck: No evidence of cervical lymphadenopathy or mass. Upper chest: Clear lung apices. Review of the MIP images confirms the above findings CTA HEAD FINDINGS Assessment is limited by early contrast timing with suboptimal arterial opacification. Anterior circulation: The internal carotid arteries are patent from skull base to carotid termini without evidence of a high-grade stenosis on the right. Apparent severe stenoses of the distal cavernous and proximal supraclinoid segments of the left ICA appear new/increased from the prior CTA and may be at least partly technical in nature although true interval progression is also possible. The MCAs are patent through the  bifurcations, however MCA branch vessels are not well evaluated. An apparent severe stenosis of the distal right M1 segment may reflect true interval worsening of a previously moderate stenosis, or the apparent change may be technical on today's  study. The A1 segments are grossly patent, however the A2 and more distal ACAs are poorly evaluated with chronic segmental occlusions and stenoses shown on the prior CTA. No aneurysm is identified. Posterior circulation: The intracranial vertebral arteries and basilar artery are patent to the basilar with apparent severe multifocal narrowing of the left greater than right V4 segments and proximal and mid basilar artery reflecting a change from the prior CTA and likely being at least partly technical in nature although new true underlying stenoses are not excluded. The PCAs are poorly evaluated but are grossly patent proximally. No aneurysm is identified. Venous sinuses: Not evaluated due to early arterial contrast timing. Anatomic variants: None. Review of the MIP images confirms the above findings CT Brain Perfusion Findings: ASPECTS: 10 CBF (<30%) Volume: 0 mL Perfusion (Tmax>6.0s) volume: 0 mL Mismatch Volume: 0 mL Infarction Location: None These results were communicated to Dr. Erlinda Hong at 10:43 am on 08/04/2022 by text page via the [facility] messaging system with subsequent telephone discussion. IMPRESSION: 1. Limited assessment of the intracranial arteries. No emergent large vessel occlusion through the MCA bifurcations. 2. Chronically advanced intracranial atherosclerosis with apparent new or worsening severe anterior and posterior circulation stenoses as described above which are likely at least partly technical/artifactual. 3. Unchanged 70% proximal right ICA stenosis. 4. Negative CTP. Electronically Signed   By: Logan Bores M.D.   On: 08/04/2022 11:08   CT Head Wo Contrast  Result Date: 08/04/2022 CLINICAL DATA:  Mental status change, unknown cause. Increased left-sided  weakness. History of stroke with baseline left-sided weakness and slurred speech. EXAM: CT HEAD WITHOUT CONTRAST TECHNIQUE: Contiguous axial images were obtained from the base of the skull through the vertex without intravenous contrast. RADIATION DOSE REDUCTION: This exam was performed according to the departmental dose-optimization program which includes automated exposure control, adjustment of the mA and/or kV according to patient size and/or use of iterative reconstruction technique. COMPARISON:  Head CT 06/30/2022 and MRI 05/01/2022 FINDINGS: Brain: There is no evidence of an acute infarct, intracranial hemorrhage, mass, midline shift, or extra-axial fluid collection. Chronic infarcts are again noted in the left parietal lobe, right occipital lobe, and right cerebellum/middle cerebellar peduncle. Mild hypodensities elsewhere in the cerebral white matter bilaterally are unchanged and nonspecific but compatible with chronic small vessel ischemia. There is mild cerebral atrophy. Vascular: No hyperdense vessel. Skull: No acute fracture or suspicious osseous lesion. Sinuses/Orbits: Remote medial right orbital fracture. Small mucous retention cysts in the maxillary sinuses. Clear mastoid air cells. Unremarkable orbits. Other: None. These results were communicated to Dr. Erlinda Hong at 10:27 am on 08/04/2022 by text page via the [facility] messaging system. IMPRESSION: 1. No evidence of acute intracranial abnormality.  ASPECTS of 10. 2. Chronic infarcts as above. Electronically Signed   By: Logan Bores M.D.   On: 08/04/2022 10:27    Pending Labs Unresulted Labs (From admission, onward)     Start     Ordered   08/11/22 0500  Creatinine, serum  (enoxaparin (LOVENOX)    CrCl >/= 30 ml/min)  Weekly,   R     Comments: while on enoxaparin therapy    08/04/22 1254   08/05/22 0500  Comprehensive metabolic panel  Tomorrow morning,   R        08/04/22 1254   08/05/22 0500  CBC  Tomorrow morning,   R        08/04/22 1254    08/04/22 1335  Iron and TIBC  Once,   AD  08/04/22 1335   08/04/22 1335  Ferritin  Once,   AD        08/04/22 1335   08/04/22 0950  Urinalysis, w/ Reflex to Culture (Infection Suspected) -Urine, Catheterized  (Urine Labs)  Once,   URGENT       Question:  Specimen Source  Answer:  Urine, Catheterized   08/04/22 0949   08/04/22 0950  Blood gas, venous (at [facility] and [facility])  Once,   R        08/04/22 0949   08/04/22 0949  Rapid urine drug screen (hospital performed)  ONCE - STAT,   STAT        08/04/22 0949            Vitals/Pain Today's Vitals   08/04/22 1625 08/04/22 1630 08/04/22 1700 08/04/22 1730  BP:  139/80 (!) 151/86 (!) 189/98  Pulse:  64 63 66  Resp:  14 13 14  $ Temp:      TempSrc:      SpO2:  99% 100% 100%  Weight:      PainSc: Asleep       Isolation Precautions No active isolations  Medications Medications  rosuvastatin (CRESTOR) tablet 40 mg (has no administration in time range)  aspirin EC tablet 81 mg (has no administration in time range)  PARoxetine (PAXIL) tablet 20 mg (has no administration in time range)  QUEtiapine (SEROQUEL) tablet 200 mg (has no administration in time range)  pantoprazole (PROTONIX) EC tablet 40 mg (has no administration in time range)  clopidogrel (PLAVIX) tablet 75 mg (has no administration in time range)  gabapentin (NEURONTIN) capsule 300 mg (has no administration in time range)  enoxaparin (LOVENOX) injection 40 mg (has no administration in time range)  sodium chloride flush (NS) 0.9 % injection 3 mL (3 mLs Intravenous Given 08/04/22 1508)  acetaminophen (TYLENOL) tablet 650 mg (has no administration in time range)    Or  acetaminophen (TYLENOL) suppository 650 mg (has no administration in time range)  polyethylene glycol (MIRALAX / GLYCOLAX) packet 17 g (has no administration in time range)  insulin aspart (novoLOG) injection 0-9 Units ( Subcutaneous Not Given 08/04/22 1510)  iohexol (OMNIPAQUE) 350 MG/ML injection 100 mL (100  mLs Intravenous Contrast Given 08/04/22 1028)    Mobility walks with device     Focused Assessments Neuro Assessment Handoff:  Swallow screen pass?  Unable to complete d/t Pt being drowsy   NIH Stroke Scale  Interval: Initial Level of Consciousness (1a.)   : Not alert, requires repeated stimulation to attend, or is obtunded and requires strong or painful stimulation to make movements (not stereotyped) LOC Questions (1b. )   : Answers one question correctly LOC Commands (1c. )   : Performs neither task correctly Best Gaze (2. )  : Forced deviation Visual (3. )  : Bilateral hemianopia (blind including cortical blindness) Facial Palsy (4. )    : Minor paralysis Motor Arm, Left (5a. )   : No effort against gravity Motor Arm, Right (5b. ) : No effort against gravity Motor Leg, Left (6a. )  : No effort against gravity Motor Leg, Right (6b. ) : No effort against gravity Limb Ataxia (7. ): Absent Sensory (8. )  : Normal, no sensory loss Best Language (9. )  : No aphasia Dysarthria (10. ): Normal Extinction/Inattention (11.)   : No Abnormality Complete NIHSS TOTAL: 23 Last date known well: 08/03/22 Last time known well:  (Unclear when pt went to bed) Neuro Assessment:  Neuro Checks:   Initial (08/04/22 1015)  Has TPA been given? No If patient is a Neuro Trauma and patient is going to OR before floor call report to [place] nurse: [phone] or [phone]   R Recommendations: See Admitting Provider Note  Report given to:   Additional Notes: Per previous RN, Hospitalist is concerned for substance/drug use.

## 2022-08-04 NOTE — Code Documentation (Signed)
Stroke Response Nurse Documentation Code Documentation  Kaytie NAYLENE GARMENDIA is a 47 y.o. female arriving to [facility]  via [facility] on 08/04/2022 with past medical hx of stroke and pseudostroke. Code stroke was activated by ED.   Patient from home where she was LKW at unknown time when going to bed per patient and now complaining of general weakness, drowsiness, anddisconjugate gaze. Pt called EMS this morning reporting that she had left sided weakness. When EMS arrived, she was generally week and same in the ED. Due to history of stroke and presneting symptoms, pt activated as a Code Stroke. Reported that she woke up with symptoms at 0800, but said she did not know when she went to bed.  Stroke team at the bedside on patient arrival. Labs drawn and patient cleared for CT by Dr. Jeanell Sparrow. Patient to CT with team. NIHSS 22, see documentation for details and code stroke times. Patient with decreased LOC, disoriented, not following commands, right gaze preference , bilateral hemianopia, left facial droop, bilateral arm weakness, and left leg weakness on exam. The following imaging was completed:  CT Head, CTA, CTP, and MRI. Patient is not a candidate for IV Thrombolytic due to outside window and MRI negative. Patient is not a candidate for IR due to no LVO on imaging per MD Erlinda Hong.   Care Plan: q2 NIHSS/VS.   Bedside handoff with ED RN Amber.    Kathrin Greathouse  Stroke Response RN

## 2022-08-04 NOTE — ED Notes (Signed)
Purewick has been placed.  

## 2022-08-04 NOTE — ED Notes (Signed)
Pt refusing to wear cardiac monitoring leads. Pt educated on importance of cardiac monitoring. Pt continues to refuse to wear leads.

## 2022-08-04 NOTE — ED Notes (Signed)
Pt was found sitting on the end of the bed removing all monitoring equipment.  Pt upset this writer had not been able to complete a swallow eval or bring her Sprite and crackers.  Also, Pt accused ED staff of taking her phone.  This Probation officer informed Pt the phone had been beside her in the bed and the phone was indeed bedside her in the bed.  Phone had been covered up as she was trying to get out of the bed.    Pt helped back into bed and all monitoring equipment replaced.  Pt passed swallow screen.  Diet Sprite and crackers provided.

## 2022-08-05 DIAGNOSIS — G929 Unspecified toxic encephalopathy: Secondary | ICD-10-CM | POA: Diagnosis not present

## 2022-08-05 DIAGNOSIS — R2990 Unspecified symptoms and signs involving the nervous system: Secondary | ICD-10-CM | POA: Diagnosis not present

## 2022-08-05 DIAGNOSIS — F1910 Other psychoactive substance abuse, uncomplicated: Secondary | ICD-10-CM

## 2022-08-05 DIAGNOSIS — D509 Iron deficiency anemia, unspecified: Secondary | ICD-10-CM | POA: Diagnosis not present

## 2022-08-05 DIAGNOSIS — F319 Bipolar disorder, unspecified: Secondary | ICD-10-CM | POA: Diagnosis not present

## 2022-08-05 LAB — BLOOD GAS, VENOUS
Acid-Base Excess: 3.9 mmol/L — ABNORMAL HIGH (ref 0.0–2.0)
Bicarbonate: 28.5 mmol/L — ABNORMAL HIGH (ref 20.0–28.0)
Drawn by: 7356
O2 Saturation: 95.8 %
Patient temperature: 36.9
pCO2, Ven: 42 mmHg — ABNORMAL LOW (ref 44–60)
pH, Ven: 7.44 — ABNORMAL HIGH (ref 7.25–7.43)
pO2, Ven: 71 mmHg — ABNORMAL HIGH (ref 32–45)

## 2022-08-05 LAB — IRON AND TIBC
Iron: 17 ug/dL — ABNORMAL LOW (ref 28–170)
Saturation Ratios: 5 % — ABNORMAL LOW (ref 10.4–31.8)
TIBC: 374 ug/dL (ref 250–450)
UIBC: 357 ug/dL

## 2022-08-05 LAB — CBC
HCT: 26.2 % — ABNORMAL LOW (ref 36.0–46.0)
Hemoglobin: 7.9 g/dL — ABNORMAL LOW (ref 12.0–15.0)
MCH: 22.8 pg — ABNORMAL LOW (ref 26.0–34.0)
MCHC: 30.2 g/dL (ref 30.0–36.0)
MCV: 75.7 fL — ABNORMAL LOW (ref 80.0–100.0)
Platelets: 289 10*3/uL (ref 150–400)
RBC: 3.46 MIL/uL — ABNORMAL LOW (ref 3.87–5.11)
RDW: 17.9 % — ABNORMAL HIGH (ref 11.5–15.5)
WBC: 4 10*3/uL (ref 4.0–10.5)
nRBC: 0 % (ref 0.0–0.2)

## 2022-08-05 LAB — COMPREHENSIVE METABOLIC PANEL
ALT: 12 U/L (ref 0–44)
AST: 15 U/L (ref 15–41)
Albumin: 2.9 g/dL — ABNORMAL LOW (ref 3.5–5.0)
Alkaline Phosphatase: 49 U/L (ref 38–126)
Anion gap: 6 (ref 5–15)
BUN: 13 mg/dL (ref 6–20)
CO2: 26 mmol/L (ref 22–32)
Calcium: 8.5 mg/dL — ABNORMAL LOW (ref 8.9–10.3)
Chloride: 105 mmol/L (ref 98–111)
Creatinine, Ser: 0.8 mg/dL (ref 0.44–1.00)
GFR, Estimated: >60 mL/min (ref >60)
Glucose, Bld: 131 mg/dL — ABNORMAL HIGH (ref 70–99)
Potassium: 3.5 mmol/L (ref 3.5–5.1)
Sodium: 137 mmol/L (ref 135–145)
Total Bilirubin: 0.2 mg/dL — ABNORMAL LOW (ref 0.3–1.2)
Total Protein: 5.9 g/dL — ABNORMAL LOW (ref 6.5–8.1)

## 2022-08-05 LAB — GLUCOSE, CAPILLARY: Glucose-Capillary: 116 mg/dL — ABNORMAL HIGH (ref 70–99)

## 2022-08-05 LAB — FERRITIN: Ferritin: 5 ng/mL — ABNORMAL LOW (ref 11–307)

## 2022-08-05 NOTE — Progress Notes (Signed)
Pt ordered for discharge. AVS reviewed with pt. Education provided as needed. Patient verbalized understanding. All questions answered.Patient is satisfied with the belongings

## 2022-08-05 NOTE — Transportation (Addendum)
08/05/2022  Kaitlyn Gray DOB: 28-Jul-1975 MRN: [id number]   RIDER WAIVER AND RELEASE OF LIABILITY  For the purposes of helping with transportation needs, [facility] partners with outside transportation providers (taxi companies, [place], Social research officer, government.) to give [employer] patients or other approved people the choice of on-demand rides [employer]") to our buildings for non-emergency visits.  By using [employer], I, the person signing this document, on behalf of myself and/or any legal minors (in my care using the [employer]), agree:  Government social research officer given to me are supplied by independent, outside transportation providers who do not work for, or have any affiliation with, [employer]. [place] is not a transportation company. [place] has no control over the quality or safety of the rides I get using [employer]. [place] has no control over whether any outside ride will happen on time or not. [facility] gives no guarantee on the reliability, quality, safety, or availability on any rides, or that no mistakes will happen. I know and accept that traveling by vehicle (car, truck, SVU, Lucianne Lei, bus, taxi, etc.) has risks of serious injuries such as disability, being paralyzed, and death. I know and agree the risk of using [employer] is mine alone, and not [employer]. Transport Services are provided "as is" and as are available. The transportation providers are in charge for all inspections and care of the vehicles used to provide these rides. I agree not to take legal action against [employer], its agents, employees, officers, directors, representatives, insurers, attorneys, assigns, successors, subsidiaries, and affiliates at any time for any reasons related directly or indirectly to using [employer]. I also agree not to take legal action against [facility] or its affiliates for any injury, death, or damage to property caused by or related to using  [employer]. I have read this Waiver and Release of Liability, and I understand the terms used in it and their legal meaning. This Waiver is freely and voluntarily given with the understanding that my right (or any legal minors) to legal action against [place] relating to [employer] is knowingly given up to use these services.   I attest that I read the Ride Waiver and Release of Liability to Kaitlyn Gray, gave Ms. Ambrosino the opportunity to ask questions and answered the questions asked (if any). I affirm that Kaitlyn Gray then provided consent for assistance with transportation.     Taxi voucher provided to [employer]

## 2022-08-05 NOTE — TOC Progression Note (Addendum)
Transition of Care [facility]) - Progression Note    Patient Details  Name: BENAY SZUCH MRN: [id number] Date of Birth: 01-07-1976  [facility]) CM/SW Contact  Levonne Lapping, RN Phone Number: 08/05/2022, 12:37 PM  Clinical Narrative:    CM  met with patient bedside to discuss  DC and recommendations. PT/OT have recommended OP Therapies. Patient requested a Nurse and an Aide. You cannot have both Home Health and OP Therapy simultaneously. Patient stated she needed the aide to come and give her a bath and to cook for her, and the Nurse to come and give her her medications. CM explained that what she was needing are more Personal Care Services(PCS)  CM explained that [place] is intermittent and for a very short duration at each visit. So while a Nurse could educate her on medication management etc, and an aide could assist with bathing 2-3 times a week, PCS was what she was describing. Patient stated that her Medicaid SW told her she could get assistance and qualified for some . SW gave CM form [id number] to complete. This form can  assist patient in expediting this process through Medicaid. Patient weas very anxious to leave . Rollator delivered to room and Patient insisted on leaving rather than wait for form to be completed and signed.  [employer] given to [employer] and cab called    Update : 1:30 PM Patients forms were faxed to [facility] at [phone] for expedited assessment.            Expected Discharge Plan and Services         Expected Discharge Date: 08/05/22                                     Social Determinants of Health (SDOH) Interventions [place]: Food Insecurity Present (08/04/2022)  Housing: Low Risk  (08/04/2022)  Transportation Needs: Unmet Transportation Needs (08/04/2022)  Utilities: Not At Risk (08/04/2022)  Depression (PHQ2-9): High Risk (07/17/2022)  Tobacco Use: High Risk (08/04/2022)     Readmission Risk Interventions    05/02/2022    1:55 PM 04/09/2022   11:37 AM  Readmission Risk Prevention Plan  Transportation Screening Complete Complete  Medication Review Press photographer) Complete Complete  PCP or Specialist appointment within 3-5 days of discharge Complete Complete  HRI or Home Care Consult Complete Complete  SW Recovery Care/Counseling Consult Complete   Palliative Care Screening Not Applicable Not Applicable  Skilled Nursing Facility Complete Complete

## 2022-08-05 NOTE — Discharge Summary (Signed)
Physician Discharge Summary  Kaitlyn Gray [id number] DOB: 12-15-1975 DOA: 08/04/2022  PCP: Dorna Mai, MD  Admit date: 08/04/2022 Discharge date: 08/05/2022 Admitted From: Home Disposition: Home Recommendations for Outpatient Follow-up:  Follow up as below. Check glycemic control, BMP and CBC at follow-up Please follow up on the following pending results: None  Home Health: Ambulatory referral to PT/OT.  Personal care service form completed and signed. Equipment/Devices: Rollator and shower seat  Discharge Condition: Stable CODE STATUS: Full code  Follow-up Information     Dorna Mai, MD. Schedule an appointment as soon as possible for a visit in 1 week(s).   Specialty: Family Medicine Contact information: [address] [place] [address] [phone]         Garvin Fila, MD. Schedule an appointment as soon as possible for a visit in 1 month(s).   Specialties: Neurology, Radiology Why: stroke clinic Contact information: [address] [place] [address] [place] Follow up.   Specialty: Rehabilitation Why: Referral has been made- Please call to schedule your appointment Contact information: [address] 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 Reportedly had episode of weakness starting when she woke up at 8 AM.  She lives alone and called EMS.  Per reports, EMS initially noted she was diffusely weak and difficult to obtain history from.  Glucose level was 160.  Has continued issues with her dysarthria/mumbling in the ED.    In ED, admitting provider unable to obtain full review of  systems due to altered mentation versus patient participation.  Vital stable.  Lab workup included CMP with glucose 105, calcium 8.8, protein 6.3, albumin 3.1.  CBC with hemoglobin 8.3 which is down from baseline of 9-10.  Ethanol level negative.  CXR, UDS and urinalysis pending.  CT head showed no acute normality.  CTA of the head and neck showed no emergent large vessel occlusion but did have a limited intracranial study, also noted was severe anterior and posterior circulation stenosis which could have been artifactual, stable right internal carotid stenosis of 70%.  MRI of the brain showed no acute normality but did demonstrate known remote infarcts.  MRI of the brain showed severe right M2 stenosis and occlusion of distal right PCA at site of known prior infarction  Neurology consulted in the ED.  The next day, UDS positive for cocaine.  Patient was back to her baseline.  Reportedly, she doubled her gabapentin dose for knee pain and took together with Seroquel which may have caused her somnolence during code stroke.  She was cleared for discharge by neurology.  Has been advised to refrain from cocaine use.  Also counseled on the importance of compliance with her meds occasion.   Ambulatory referral to PT/OT ordered as recommended by therapy.  Also ordered rollator and shower seat.  Personal care service form completed and signed.   See individual problem list below for more.   Problems addressed during this hospitalization Active Problems:   Bipolar disorder ([place])   Polysubstance abuse ([place])   Type 2 diabetes mellitus ([place])   Hypertension associated with diabetes ([place])   Depression  Microcytic anemia   History of stroke   Class 1 obesity   Toxic encephalopathy   Stroke-like symptoms              Vital signs Vitals:   08/05/22 0035 08/05/22 0400 08/05/22 0517 08/05/22 0750  BP: (!) 144/76 (!) 147/80  (!) 148/98  Pulse: 71 79  70  Temp: 97.9 F (36.6 C) 98 F (36.7 C)  97.8 F  (36.6 C)  Resp: 15 16  18  $ Height:   5' 4"$  (1.626 m)   Weight:   84 kg   SpO2: 96% 98%  98%  TempSrc: Axillary Axillary  Oral  BMI (Calculated):   31.77      Discharge exam  GENERAL: No apparent distress.  Nontoxic. HEENT: MMM.  Vision and hearing grossly intact.  NECK: Supple.  No apparent JVD.  RESP:  No IWOB.  Fair aeration bilaterally. CVS:  RRR. Heart sounds normal.  ABD/GI/GU: BS+. Abd soft, NTND.  MSK/EXT:  Moves extremities.  Left hemiparesis but inconsistent exam SKIN: no apparent skin lesion or wound NEURO: Awake and alert. Oriented appropriately.  Notable dysarthria.  Left hemiparesis but inconsistent PSYCH: Calm. Normal affect.   Discharge Instructions Discharge Instructions     Diet - low sodium heart healthy   Complete by: As directed    Diet Carb Modified   Complete by: As directed    Discharge instructions   Complete by: As directed    It has been a pleasure taking care of you!  You were hospitalized due to strokelike symptoms.  Your CT scan and MRI did not show new stroke.  However, your urine drug screen is positive for cocaine.  It is very important that you refrain from cocaine use, and take your medications as prescribed.  Follow-up with your primary care doctor in 1 to 2 weeks or sooner if needed.   Take care,   Increase activity slowly   Complete by: As directed       Allergies as of 08/05/2022       Reactions   Hydrocodone Itching   Latex Itching, Rash        Medication List     TAKE these medications    amLODipine 10 MG tablet Commonly known as: NORVASC Take 1 tablet (10 mg total) by mouth daily.   aspirin EC 81 MG tablet Take 1 tablet (81 mg total) by mouth daily. Swallow whole.   clopidogrel 75 MG tablet Commonly known as: PLAVIX Take 1 tablet (75 mg total) by mouth daily.   ferrous sulfate 325 (65 FE) MG tablet Take 1 tablet (325 mg total) by mouth every other day.   gabapentin 300 MG capsule Commonly known as:  Neurontin Take 1 capsule (300 mg total) by mouth at bedtime.   losartan 100 MG tablet Commonly known as: COZAAR Take 1 tablet (100 mg total) by mouth daily.   metFORMIN 500 MG tablet Commonly known as: GLUCOPHAGE Take 1 tablet (500 mg total) by mouth 2 (two) times daily with a meal.   pantoprazole 40 MG tablet Commonly known as: PROTONIX Take 1 tablet (40 mg total) by mouth daily.   PARoxetine 20 MG tablet Commonly known as: Paxil Take 1 tablet (20 mg total) by mouth daily.   QUEtiapine 200 MG tablet Commonly known as: SEROQUEL Take 1 tablet (200 mg total) by mouth at bedtime.   rosuvastatin 40 MG tablet Commonly known as: CRESTOR Take 1 tablet (40 mg total) by mouth daily.  Durable Medical Equipment  (From admission, onward)           Start     Ordered   08/05/22 1147  For home use only DME 4 wheeled rolling walker with seat  Once       Question:  Patient needs a walker to treat with the following condition  Answer:  Left hemiparesis ([place])   08/05/22 1146   08/05/22 1147  For home use only DME Shower stool  Once        08/05/22 1146            Consultations: Neurology  Procedures/Studies:   [facility] Chest Port 1 View  Result Date: 08/04/2022 CLINICAL DATA:  Altered mental status.  Left-sided weakness EXAM: PORTABLE CHEST 1 VIEW COMPARISON:  04/12/2022 x-ray FINDINGS: Enlarged cardiopericardial silhouette with vascular congestion. Widening of the mediastinum is stable. Underinflation. No pneumothorax, effusion. Overlapping cardiac leads. Degenerative changes are noted along the spine and left shoulder IMPRESSION: Enlarged cardiopericardial silhouette with increasing vascular congestion. Underinflation. Electronically Signed   By: Jill Side M.D.   On: 08/04/2022 12:52   MR BRAIN WO CONTRAST  Result Date: 08/04/2022 CLINICAL DATA:  Stroke, follow up EXAM: MRI HEAD WITHOUT CONTRAST MRA HEAD WITHOUT CONTRAST TECHNIQUE: Multiplanar, multi-echo  pulse sequences of the brain and surrounding structures were acquired without intravenous contrast. Angiographic images of the Circle of Willis were acquired using MRA technique without intravenous contrast. COMPARISON:  CT head from the same day. FINDINGS: MRI HEAD FINDINGS Brain: Remote right PCA territory infarct. Remote left parietal cortical infarct. Possible remote right pontine infarct. Patchy T2/FLAIR hyperintensities in the white matter, compatible with chronic microvascular ischemic disease. No evidence of acute infarct, acute hemorrhage, mass lesion, midline shift or hydrocephalus. Vascular: Major arterial flow voids are maintained at the skull base. Skull and upper cervical spine: Normal marrow signal. Sinuses/Orbits: Largely clear sinuses.  No acute orbital findings. Other: No mastoid effusions. MRA HEAD FINDINGS Motion limited MRA.  Within this limitation: Anterior circulation: Severe proximal right M2 MCA stenosis. Otherwise, bilateral intracranial ICAs, MCAs, and ACAs are patent without proximal high-grade stenosis. Posterior circulation: Bilateral intradural vertebral arteries, basilar artery and bilateral posterior cerebral arteries are patent. Mild to moderate stenosis of the right intradural vertebral artery and right P2 PCA. Occluded distal right PCA in the region of known remote infarct. IMPRESSION: MRI: 1. No evidence of acute intracranial abnormality. 2. Remote infarcts, described above. MRA: 1. No emergent large vessel occlusion. 2. Severe right proximal M2 MCA stenosis. 3. Occluded distal right PCA in the region of known remote infarct. Electronically Signed   By: Margaretha Sheffield M.D.   On: 08/04/2022 12:37   MR ANGIO HEAD WO CONTRAST  Result Date: 08/04/2022 CLINICAL DATA:  Stroke, follow up EXAM: MRI HEAD WITHOUT CONTRAST MRA HEAD WITHOUT CONTRAST TECHNIQUE: Multiplanar, multi-echo pulse sequences of the brain and surrounding structures were acquired without intravenous contrast.  Angiographic images of the Circle of Willis were acquired using MRA technique without intravenous contrast. COMPARISON:  CT head from the same day. FINDINGS: MRI HEAD FINDINGS Brain: Remote right PCA territory infarct. Remote left parietal cortical infarct. Possible remote right pontine infarct. Patchy T2/FLAIR hyperintensities in the white matter, compatible with chronic microvascular ischemic disease. No evidence of acute infarct, acute hemorrhage, mass lesion, midline shift or hydrocephalus. Vascular: Major arterial flow voids are maintained at the skull base. Skull and upper cervical spine: Normal marrow signal. Sinuses/Orbits: Largely clear sinuses.  No acute orbital findings. Other: No mastoid  effusions. MRA HEAD FINDINGS Motion limited MRA.  Within this limitation: Anterior circulation: Severe proximal right M2 MCA stenosis. Otherwise, bilateral intracranial ICAs, MCAs, and ACAs are patent without proximal high-grade stenosis. Posterior circulation: Bilateral intradural vertebral arteries, basilar artery and bilateral posterior cerebral arteries are patent. Mild to moderate stenosis of the right intradural vertebral artery and right P2 PCA. Occluded distal right PCA in the region of known remote infarct. IMPRESSION: MRI: 1. No evidence of acute intracranial abnormality. 2. Remote infarcts, described above. MRA: 1. No emergent large vessel occlusion. 2. Severe right proximal M2 MCA stenosis. 3. Occluded distal right PCA in the region of known remote infarct. Electronically Signed   By: Margaretha Sheffield M.D.   On: 08/04/2022 12:37   CT ANGIO HEAD NECK W WO CM W PERF (CODE STROKE)  Result Date: 08/04/2022 CLINICAL DATA:  Neuro deficit, acute, stroke suspected. Increased left-sided weakness. History of stroke with baseline left-sided weakness and slurred speech. EXAM: CT ANGIOGRAPHY HEAD AND NECK CT PERFUSION BRAIN TECHNIQUE: Multidetector CT imaging of the head and neck was performed using the standard  protocol during bolus administration of intravenous contrast. Multiplanar CT image reconstructions and MIPs were obtained to evaluate the vascular anatomy. Carotid stenosis measurements (when applicable) are obtained utilizing NASCET criteria, using the distal internal carotid diameter as the denominator. Multiphase CT imaging of the brain was performed following IV bolus contrast injection. Subsequent parametric perfusion maps were calculated using RAPID software. RADIATION DOSE REDUCTION: This exam was performed according to the departmental dose-optimization program which includes automated exposure control, adjustment of the mA and/or kV according to patient size and/or use of iterative reconstruction technique. CONTRAST:  135m OMNIPAQUE IOHEXOL 350 MG/ML SOLN COMPARISON:  Head and neck CTA and CT perfusion 05/31/2022 FINDINGS: CTA NECK FINDINGS Aortic arch: Standard 3 vessel aortic arch. Poor assessment of the arch vessel origins due to streak artifact from venous contrast bolus. Right carotid system: Patent with similar appearance of an approximately 70% stenosis of the proximal ICA compared to the prior study. Retropharyngeal course of the proximal ICA. Left carotid system: Patent without evidence of a significant stenosis or dissection. Retropharyngeal course of the proximal ICA. Vertebral arteries: Patent without evidence of a dissection or high-grade stenosis in the neck. Mildly dominant right vertebral artery. Skeleton: No suspicious osseous lesion. Other neck: No evidence of cervical lymphadenopathy or mass. Upper chest: Clear lung apices. Review of the MIP images confirms the above findings CTA HEAD FINDINGS Assessment is limited by early contrast timing with suboptimal arterial opacification. Anterior circulation: The internal carotid arteries are patent from skull base to carotid termini without evidence of a high-grade stenosis on the right. Apparent severe stenoses of the distal cavernous and  proximal supraclinoid segments of the left ICA appear new/increased from the prior CTA and may be at least partly technical in nature although true interval progression is also possible. The MCAs are patent through the bifurcations, however MCA branch vessels are not well evaluated. An apparent severe stenosis of the distal right M1 segment may reflect true interval worsening of a previously moderate stenosis, or the apparent change may be technical on today's study. The A1 segments are grossly patent, however the A2 and more distal ACAs are poorly evaluated with chronic segmental occlusions and stenoses shown on the prior CTA. No aneurysm is identified. Posterior circulation: The intracranial vertebral arteries and basilar artery are patent to the basilar with apparent severe multifocal narrowing of the left greater than right V4 segments and proximal and  mid basilar artery reflecting a change from the prior CTA and likely being at least partly technical in nature although new true underlying stenoses are not excluded. The PCAs are poorly evaluated but are grossly patent proximally. No aneurysm is identified. Venous sinuses: Not evaluated due to early arterial contrast timing. Anatomic variants: None. Review of the MIP images confirms the above findings CT Brain Perfusion Findings: ASPECTS: 10 CBF (<30%) Volume: 0 mL Perfusion (Tmax>6.0s) volume: 0 mL Mismatch Volume: 0 mL Infarction Location: None These results were communicated to Dr. Erlinda Hong at 10:43 am on 08/04/2022 by text page via the [facility] messaging system with subsequent telephone discussion. IMPRESSION: 1. Limited assessment of the intracranial arteries. No emergent large vessel occlusion through the MCA bifurcations. 2. Chronically advanced intracranial atherosclerosis with apparent new or worsening severe anterior and posterior circulation stenoses as described above which are likely at least partly technical/artifactual. 3. Unchanged 70% proximal right ICA  stenosis. 4. Negative CTP. Electronically Signed   By: Logan Bores M.D.   On: 08/04/2022 11:08   CT Head Wo Contrast  Result Date: 08/04/2022 CLINICAL DATA:  Mental status change, unknown cause. Increased left-sided weakness. History of stroke with baseline left-sided weakness and slurred speech. EXAM: CT HEAD WITHOUT CONTRAST TECHNIQUE: Contiguous axial images were obtained from the base of the skull through the vertex without intravenous contrast. RADIATION DOSE REDUCTION: This exam was performed according to the departmental dose-optimization program which includes automated exposure control, adjustment of the mA and/or kV according to patient size and/or use of iterative reconstruction technique. COMPARISON:  Head CT 06/30/2022 and MRI 05/01/2022 FINDINGS: Brain: There is no evidence of an acute infarct, intracranial hemorrhage, mass, midline shift, or extra-axial fluid collection. Chronic infarcts are again noted in the left parietal lobe, right occipital lobe, and right cerebellum/middle cerebellar peduncle. Mild hypodensities elsewhere in the cerebral white matter bilaterally are unchanged and nonspecific but compatible with chronic small vessel ischemia. There is mild cerebral atrophy. Vascular: No hyperdense vessel. Skull: No acute fracture or suspicious osseous lesion. Sinuses/Orbits: Remote medial right orbital fracture. Small mucous retention cysts in the maxillary sinuses. Clear mastoid air cells. Unremarkable orbits. Other: None. These results were communicated to Dr. Erlinda Hong at 10:27 am on 08/04/2022 by text page via the [facility] messaging system. IMPRESSION: 1. No evidence of acute intracranial abnormality.  ASPECTS of 10. 2. Chronic infarcts as above. Electronically Signed   By: Logan Bores M.D.   On: 08/04/2022 10:27       The results of significant diagnostics from this hospitalization (including imaging, microbiology, ancillary and laboratory) are listed below for reference.      Microbiology: No results found for this or any previous visit (from the past 240 hour(s)).   Labs:  CBC: Recent Labs  Lab 08/04/22 1005 08/04/22 1012 08/04/22 1014 08/05/22 1036  WBC 4.4  --   --  4.0  HGB 8.3* 9.5* 9.5* 7.9*  HCT 28.4* 28.0* 28.0* 26.2*  MCV 77.8*  --   --  75.7*  PLT 288  --   --  289   BMP &GFR Recent Labs  Lab 08/04/22 1005 08/04/22 1012 08/04/22 1014 08/05/22 1036  NA 139 142 141 137  K 3.8 3.8 3.8 3.5  CL 106 104  --  105  CO2 24  --   --  26  GLUCOSE 105* 100*  --  131*  BUN 13 18  --  13  CREATININE 0.73 0.80  --  0.80  CALCIUM 8.8*  --   --  8.5*  MG 1.9  --   --   --    Estimated Creatinine Clearance: 91.1 mL/min (by C-G formula based on SCr of 0.8 mg/dL). Liver & Pancreas: Recent Labs  Lab 08/04/22 1005 08/05/22 1036  AST 17 15  ALT 13 12  ALKPHOS 50 49  BILITOT 0.2* 0.2*  PROT 6.3* 5.9*  ALBUMIN 3.1* 2.9*   No results for input(s): "LIPASE", "AMYLASE" in the last 168 hours. No results for input(s): "AMMONIA" in the last 168 hours. Diabetic: No results for input(s): "HGBA1C" in the last 72 hours. Recent Labs  Lab 08/04/22 1503 08/05/22 0512  GLUCAP 111* 116*   Cardiac Enzymes: No results for input(s): "CKTOTAL", "CKMB", "CKMBINDEX", "TROPONINI" in the last 168 hours. No results for input(s): "PROBNP" in the last 8760 hours. Coagulation Profile: No results for input(s): "INR", "PROTIME" in the last 168 hours. Thyroid Function Tests: Recent Labs    08/04/22 1005  TSH 0.955   Lipid Profile: No results for input(s): "CHOL", "HDL", "LDLCALC", "TRIG", "CHOLHDL", "LDLDIRECT" in the last 72 hours. Anemia Panel: Recent Labs    08/05/22 1036  FERRITIN 5*  TIBC 374  IRON 17*   Urine analysis:    Component Value Date/Time   COLORURINE YELLOW 08/04/2022 [place] 08/04/2022 1753   LABSPEC <1.005 (L) 08/04/2022 1753   PHURINE 6.0 08/04/2022 1753   GLUCOSEU NEGATIVE 08/04/2022 1753   HGBUR LARGE  (A) 08/04/2022 1753   BILIRUBINUR NEGATIVE 08/04/2022 1753   KETONESUR NEGATIVE 08/04/2022 1753   PROTEINUR NEGATIVE 08/04/2022 1753   UROBILINOGEN 0.2 10/06/2014 0856   NITRITE NEGATIVE 08/04/2022 1753   LEUKOCYTESUR NEGATIVE 08/04/2022 1753   Sepsis Labs: Invalid input(s): "PROCALCITONIN", "LACTICIDVEN"   SIGNED:  Mercy Riding, MD  [facility] 08/05/2022, 5:55 PM

## 2022-08-05 NOTE — Evaluation (Addendum)
Occupational Therapy Evaluation Patient Details Name: Kaitlyn Gray MRN: [id number] DOB: 1976/03/26 Today's Date: 08/05/2022   History of Present Illness 47 y.o. female presents to [facility] hospital on 08/04/2022 with worsening weakness. Imaging negative for acute abnormality. PMH:  two prior CVA (2021, 2022), HTN, T2DM, bipolar   Clinical Impression   Pt today is pleasant and cooperative with therapy team. She is tangential in speech and demonstrates L sided weakness and dysarthria from previous CVA. She is typically mod I with Rollator and BSC - but reports ADL and IADL being very difficult to perform (she has to get up a flight of steps to access bathroom). Today she is min guard for transfers with RW, more difficulty with bed mobility mod A, and overall min A for UB ADL and mod A for LB ADL (especially when concerning LLE) Vision assessed and hard to determine what is old vs new. Pt reports blurry vision and no glasses present in room today (reading) please see full vision assessment below. Pt also reports increased numbness in LUE than baseline - poorer performance during MMT than during functional activities at sink (see ADL section) At this time recommending OPOT  post-acute. She also is very interested in getting a home health Aide to assist with ADL/IADL to increase safety and overall quality of life. OT will continue to follow acutely and continue to assess vision and functional transfers/ADL.      Recommendations for follow up therapy are one component of a multi-disciplinary discharge planning process, led by the attending physician.  Recommendations may be updated based on patient status, additional functional criteria and insurance authorization.   Follow Up Recommendations   OPOT    Assistance Recommended at Discharge Intermittent Supervision/Assistance (Pt is very interested in [place])  Patient can return home with the following A little help with walking and/or transfers;A little help  with bathing/dressing/bathroom;Assistance with cooking/housework;Assist for transportation;Help with stairs or ramp for entrance    Functional Status Assessment  Patient has had a recent decline in their functional status and demonstrates the ability to make significant improvements in function in a reasonable and predictable amount of time.  Equipment Recommendations  Tub/shower bench    Recommendations for Other Services PT consult     Precautions / Restrictions Precautions Precautions: Fall Restrictions Weight Bearing Restrictions: No      Mobility Bed Mobility Overal bed mobility: Needs Assistance Bed Mobility: Supine to Sit, Sit to Supine     Supine to sit: Min assist Sit to supine: Mod assist (for LLE "I can't do it" question - Pt did not attempt on her own "I just can't")   General bed mobility comments: pt utilizing RLE to assist LLE, assist to pivot hips    Transfers Overall transfer level: Needs assistance Equipment used: Rollator (4 wheels) Transfers: Sit to/from Stand Sit to Stand: Min guard           General transfer comment: pushes up on Rollator at baseline - not from solid surface      Balance Overall balance assessment: Needs assistance Sitting-balance support: No upper extremity supported, Feet supported Sitting balance-Leahy Scale: Good     Standing balance support: Single extremity supported, Reliant on assistive device for balance, Bilateral upper extremity supported, During functional activity Standing balance-Leahy Scale: Poor Standing balance comment: leans against sink for balance during grooming                           ADL  either performed or assessed with clinical judgement   ADL Overall ADL's : Needs assistance/impaired Eating/Feeding: Set up Eating/Feeding Details (indicate cue type and reason): typically heats up microwave meals Grooming: Wash/dry hands;Wash/dry face;Oral care;Brushing hair;Minimal assistance;Cueing  for compensatory techniques;Standing Grooming Details (indicate cue type and reason): cues to locate grooming items around sink Upper Body Bathing: Minimal assistance;Sitting   Lower Body Bathing: Minimal assistance;Sitting/lateral leans   Upper Body Dressing : Minimal assistance;Sitting Upper Body Dressing Details (indicate cue type and reason): don/doff extra gown like robe Lower Body Dressing: Minimal assistance;Sitting/lateral leans Lower Body Dressing Details (indicate cue type and reason): socks, extra time and effort required  - especailly with L foot Toilet Transfer: Min guard;Ambulation;Rollator (4 wheels)   Toileting- Water quality scientist and Hygiene: Min guard;Sit to/from stand       Functional mobility during ADLs: Engineer, manufacturing (4 wheels) General ADL Comments: Pt reports frequent falls during bathing and struggling to ascend/descend stairs - especially with equipment     Vision Baseline Vision/History: 1 Wears glasses (readers) Ability to See in Adequate Light: 1 Impaired Patient Visual Report: Blurring of vision Vision Assessment?: Yes Eye Alignment: Impaired (comment) (fine for fwd gaze, disconjugate with tracking) Ocular Range of Motion: Impaired-to be further tested in functional context Alignment/Gaze Preference: Within Defined Limits Tracking/Visual Pursuits: Decreased smoothness of horizontal tracking;Decreased smoothness of vertical tracking;Requires cues, head turns, or add eye shifts to track;Unable to hold eye position out of midline;Impaired - to be further tested in functional context Saccades: Additional head turns occurred during testing;Decreased speed of saccadic movement;Impaired - to be further tested in functional context Convergence: Impaired - to be further tested in functional context Diplopia Assessment:  (denies, despite disconjugate gaze present) Additional Comments: functionally she missed grooming items while standing at the sink, and  required cues for therapist to use them (cup instead of hand to scoop water, wash cloth in stead of paper towels to wash face etc)     Perception     Praxis      Pertinent Vitals/Pain Pain Assessment Pain Assessment: Faces Faces Pain Scale: No hurt Pain Intervention(s): Monitored during session, Repositioned     Hand Dominance Right   Extremity/Trunk Assessment Upper Extremity Assessment Upper Extremity Assessment: LUE deficits/detail LUE Deficits / Details: Pt LUE is weaker than R due to prior CVA. She reports that strength regularly waxes and waines. Today it seems about baseline. She reports increased numbness, consistent proximally and distally. Decreased fine motor and gross motor during MMT - functionally at sink she is able to open toothbrush, toothpaste, perform oral care, get both hands up to head to brush hair/adjust wig and wash face LUE Sensation: decreased light touch LUE Coordination: decreased fine motor;decreased gross motor (inconsistent better in functional context than with MMT)   Lower Extremity Assessment Lower Extremity Assessment: Defer to PT evaluation LLE Deficits / Details: inconsistencies in strength noted during evaluation. Pt initially unable to move LLE with bed mobility, hooking RLE to assist LLE in mobility. Pt later utilizes LLE to assist in standing and advances LLE slowly yet consistently when ambulating. Pt with subjective reports of numbness in LLE LLE Sensation: decreased light touch   Cervical / Trunk Assessment Cervical / Trunk Assessment: Normal   Communication Communication Communication: Expressive difficulties (dysarthric from prior CVA)   Cognition Arousal/Alertness: Awake/alert Behavior During Therapy: WFL for tasks assessed/performed Overall Cognitive Status: No family/caregiver present to determine baseline cognitive functioning  General Comments: alert and oriented, follows commands  well     General Comments  VSS on RA, Pt with tangential speech, very friendly and cooperative but LOVES to talk :)    Exercises     Shoulder Instructions      Home Living Family/patient expects to be discharged to:: Private residence Living Arrangements: Alone Available Help at Discharge:  (none identified) Type of Home: Apartment Home Access: Stairs to enter Entrance Stairs-Number of Steps: 6 Entrance Stairs-Rails: Right Home Layout: Two level;Bed/bath upstairs (sleeps downstairs recently, goes upstairs to bathe) Alternate Level Stairs-Number of Steps: 13 Alternate Level Stairs-Rails: Right;Left Bathroom Shower/Tub: Teacher, early years/pre: Standard Bathroom Accessibility: No   Home Equipment: Rollator (4 wheels);Rolling Walker (2 wheels);BSC/3in1 (rolllator is missing brakes)          Prior Functioning/Environment Prior Level of Function : History of Falls (last six months)             Mobility Comments: ambulatory with rollator, reports 6-7 falls in last few months ADLs Comments: reports significant difficulty with ADLs and IADLs        OT Problem List:        OT Treatment/Interventions: Self-care/ADL training;Neuromuscular education;DME and/or AE instruction;Therapeutic activities;Visual/perceptual remediation/compensation;Patient/family education;Balance training    OT Goals(Current goals can be found in the care plan section) Acute Rehab OT Goals Patient Stated Goal: get an aide that can assist with ADL/IADL OT Goal Formulation: With patient Time For Goal Achievement: 08/19/22 Potential to Achieve Goals: Good ADL Goals Pt Will Perform Grooming: with modified independence;standing Pt Will Perform Upper Body Dressing: with modified independence;sitting Pt Will Perform Lower Body Dressing: with modified independence;sit to/from stand Pt Will Transfer to Toilet: with modified independence;ambulating Pt Will Perform Toileting - Clothing  Manipulation and hygiene: with modified independence;sit to/from stand Additional ADL Goal #1: Pt will perform bed mobility as precursor to participation in ADL at mod I level (especially managing LLE) Additional ADL Goal #2: Pt will locate all grooming items on sink (to L and R) utilizing visual compensatory strategies with no cues  OT Frequency: Min 2X/week    Co-evaluation PT/OT/SLP Co-Evaluation/Treatment: Yes Reason for Co-Treatment: Necessary to address cognition/behavior during functional activity;For patient/therapist safety;To address functional/ADL transfers PT goals addressed during session: Mobility/safety with mobility;Balance;Proper use of DME;Strengthening/ROM OT goals addressed during session: ADL's and self-care;Proper use of Adaptive equipment and DME;Other (comment);Strengthening/ROM (vision/cognition)      AM-PAC OT "6 Clicks" Daily Activity     Outcome Measure Help from another person eating meals?: A Little Help from another person taking care of personal grooming?: A Little Help from another person toileting, which includes using toliet, bedpan, or urinal?: A Little Help from another person bathing (including washing, rinsing, drying)?: A Little Help from another person to put on and taking off regular upper body clothing?: A Little Help from another person to put on and taking off regular lower body clothing?: A Lot 6 Click Score: 17   End of Session Equipment Utilized During Treatment: Gait belt;Rollator (4 wheels) Nurse Communication: Mobility status;Precautions (wants apple juice)  Activity Tolerance: Patient tolerated treatment well Patient left: in bed;with call bell/phone within reach;with bed alarm set  OT Visit Diagnosis: Other abnormalities of gait and mobility (R26.89);Repeated falls (R29.6);Muscle weakness (generalized) (M62.81);History of falling (Z91.81);Low vision, both eyes (H54.2);Other symptoms and signs involving the nervous system  (R29.898);Hemiplegia and hemiparesis Hemiplegia - Right/Left: Left Hemiplegia - dominant/non-dominant: Non-Dominant Hemiplegia - caused by:  (previous CVA)  Time: [id number] OT Time Calculation (min): 35 min Charges:  OT General Charges $OT Visit: 1 Visit OT Evaluation $OT Eval Moderate Complexity: [place] OTR/L Acute Rehabilitation Services Office: [place] 08/05/2022, 9:57 AM

## 2022-08-05 NOTE — Progress Notes (Signed)
Pt refused her CBG checks.MD aware.

## 2022-08-05 NOTE — Evaluation (Signed)
Physical Therapy Evaluation Patient Details Name: Kaitlyn Gray MRN: [id number] DOB: 25-Mar-1976 Today's Date: 08/05/2022  History of Present Illness  47 y.o. female presents to [facility] hospital on 08/04/2022 with worsening weakness. Imaging negative for acute abnormality. PMH:  two prior CVA (2021, 2022), HTN, T2DM, bipolar  Clinical Impression  Pt presents to PT with deficits in L sided strength, power, gait, balance, endurance, and reported deficits in sensation. PT notes inconsistencies in LLE strength during session, with poor ability to utilize LLE at bed level but improved ability to utilize during other functional tasks including ambulation. Pt reports subjective numbness of L side, which she states is worse than at her baseline. Pt reports having difficulty managing ADLs and IADLs, having to ascend steps to reach her shower or to empty her bedside commode. Pt expresses a desire to have an aide to assist with bathing and stairs negotiation. Aide services  would likely improve patient safety for bathing as she reports many falls when showering. Pt's current walker does not have brakes. PT recommends the pt receive a new 4 wheeled walker in an effort to improve stability and safety when mobilizing. Pt also expresses difficulty receiving home health therapy services in the past, requesting outpatient PT instead. Acute PT will continue to follow during this admission.     Recommendations for follow up therapy are one component of a multi-disciplinary discharge planning process, led by the attending physician.  Recommendations may be updated based on patient status, additional functional criteria and insurance authorization.  Follow Up Recommendations Outpatient PT      Assistance Recommended at Discharge PRN  Patient can return home with the following  A little help with bathing/dressing/bathroom;Assistance with cooking/housework;Assist for transportation;Help with stairs or ramp for entrance     Equipment Recommendations Rollator (4 wheels) (pt's current rollator does not have brakes)  Recommendations for Other Services       Functional Status Assessment Patient has had a recent decline in their functional status and demonstrates the ability to make significant improvements in function in a reasonable and predictable amount of time.     Precautions / Restrictions Precautions Precautions: Fall Restrictions Weight Bearing Restrictions: No      Mobility  Bed Mobility Overal bed mobility: Needs Assistance Bed Mobility: Supine to Sit     Supine to sit: Min assist     General bed mobility comments: pt utilizing RLE to assist LLE, assist to pivot hips    Transfers Overall transfer level: Needs assistance Equipment used: Rolling walker (2 wheels) Transfers: Sit to/from Stand Sit to Stand: Min guard                Ambulation/Gait Ambulation/Gait assistance: Min guard Gait Distance (Feet): 80 Feet Assistive device: Rolling walker (2 wheels) Gait Pattern/deviations: Step-to pattern Gait velocity: reduced Gait velocity interpretation: <1.31 ft/sec, indicative of household ambulator   General Gait Details: slowed step-to gait, reduced gait speed, decreased clearance of L foot and reduced step length  Stairs            Wheelchair Mobility    Modified Rankin (Stroke Patients Only)       Balance Overall balance assessment: Needs assistance Sitting-balance support: No upper extremity supported, Feet supported Sitting balance-Leahy Scale: Good     Standing balance support: Single extremity supported, Reliant on assistive device for balance Standing balance-Leahy Scale: Poor  Pertinent Vitals/Pain      Home Living Family/patient expects to be discharged to:: Private residence Living Arrangements: Alone Available Help at Discharge:  (none identified) Type of Home: Apartment Home Access: Stairs to  enter Entrance Stairs-Rails: Right Entrance Stairs-Number of Steps: 6 Alternate Level Stairs-Number of Steps: 13 Home Layout: Two level;Bed/bath upstairs (sleeps downstairs recently, goes upstairs to bathe) Home Equipment: Rollator (4 wheels);Rolling Environmental consultant (2 wheels);BSC/3in1 (rolllator is missing brakes)      Prior Function Prior Level of Function : History of Falls (last six months)             Mobility Comments: ambulatory with rollator, reports 6-7 falls in last few months ADLs Comments: reports significant difficulty with ADLs and IADLs     Hand Dominance   Dominant Hand: Right    Extremity/Trunk Assessment   Upper Extremity Assessment Upper Extremity Assessment: Defer to OT evaluation    Lower Extremity Assessment Lower Extremity Assessment: LLE deficits/detail LLE Deficits / Details: inconsistencies in strength noted during evaluation. Pt initially unable to move LLE with bed mobility, hooking RLE to assist LLE in mobility. Pt later utilizes LLE to assist in standing and advances LLE slowly yet consistently when ambulating. Pt with subjective reports of numbness in LLE LLE Sensation: decreased light touch    Cervical / Trunk Assessment Cervical / Trunk Assessment: Normal  Communication   Communication: Expressive difficulties (dysarthric from prior CVA)  Cognition Arousal/Alertness: Awake/alert Behavior During Therapy: WFL for tasks assessed/performed Overall Cognitive Status: No family/caregiver present to determine baseline cognitive functioning                                 General Comments: alert and oriented, follows commands well        General Comments General comments (skin integrity, edema, etc.): VSS on RA    Exercises     Assessment/Plan    PT Assessment Patient needs continued PT services  PT Problem List Decreased strength;Decreased activity tolerance;Decreased balance;Decreased mobility       PT Treatment Interventions  DME instruction;Gait training;Stair training;Functional mobility training;Therapeutic activities;Balance training;Patient/family education;Neuromuscular re-education    PT Goals (Current goals can be found in the Care Plan section)  Acute Rehab PT Goals Patient Stated Goal: to improve strength PT Goal Formulation: With patient Time For Goal Achievement: 08/19/22 Potential to Achieve Goals: Good    Frequency Min 3X/week     Co-evaluation PT/OT/SLP Co-Evaluation/Treatment: Yes Reason for Co-Treatment: Necessary to address cognition/behavior during functional activity;For patient/therapist safety;To address functional/ADL transfers           AM-PAC PT "6 Clicks" Mobility  Outcome Measure Help needed turning from your back to your side while in a flat bed without using bedrails?: A Little Help needed moving from lying on your back to sitting on the side of a flat bed without using bedrails?: A Little Help needed moving to and from a bed to a chair (including a wheelchair)?: A Little Help needed standing up from a chair using your arms (e.g., wheelchair or bedside chair)?: A Little Help needed to walk in hospital room?: A Little Help needed climbing 3-5 steps with a railing? : A Little 6 Click Score: 18    End of Session Equipment Utilized During Treatment: Gait belt Activity Tolerance: Patient tolerated treatment well Patient left: with call bell/phone within reach (standing at sink with OT) Nurse Communication: Mobility status PT Visit Diagnosis: Other abnormalities of gait and  mobility (R26.89);Muscle weakness (generalized) (M62.81);Other symptoms and signs involving the nervous system (R29.898)    Time: [id number] PT Time Calculation (min) (ACUTE ONLY): 25 min   Charges:   PT Evaluation $PT Eval Low Complexity: [place], PT, DPT Acute Rehabilitation Office [phone]   Zenaida Niece 08/05/2022, 9:24 AM

## 2022-08-05 NOTE — Progress Notes (Signed)
AVS review has been on hold as per case manager's order.

## 2022-08-05 NOTE — Plan of Care (Signed)
Patient discharged to home with self.

## 2022-08-05 NOTE — Plan of Care (Signed)
No family at the bedside. Pt now back to her baseline, no disconjugated gaze. She said she woke up last night 7pm in hospital and she was good. she said the night before she doubled her gabapentin dose for knee pain and took together with the seroquel which may be the cause of her somnolence yesterday morning during code stroke. Her UDS continued to show cocaine. I wonder whether those sedative effect caused dyes disconjugate which was frequently seen on those ICU patients with sedation.   No further recs from neuro standpoint. Continue home meds and compliant with medication, no overdose. Neuro will sign off for now, please call with questions. She has been following with Dr. Leonie Man at [facility]  [name] Hawking, MD PhD Stroke Neurology 08/05/2022 12:14 PM

## 2022-08-06 ENCOUNTER — Telehealth: Payer: Self-pay

## 2022-08-06 NOTE — Transitions of Care (Post Inpatient/ED Visit) (Signed)
   08/06/2022  Name: Kaitlyn Gray MRN: [id number] DOB: Jun 19, 1975  Today's TOC FU Call Status: Today's TOC FU Call Status:: Unsuccessul Call (1st Attempt) Unsuccessful Call (1st Attempt) Date: 08/06/22  Attempted to reach the patient regarding the most recent Inpatient/ED visit.  Follow Up Plan: Additional outreach attempts will be made to reach the patient to complete the Transitions of Care (Post Inpatient/ED visit) call.   [place] LPN [place] Advisor Direct Dial [phone]

## 2022-08-07 NOTE — Transitions of Care (Post Inpatient/ED Visit) (Signed)
   08/07/2022  Name: Kaitlyn Gray MRN: [id number] DOB: Nov 13, 1975  Today's TOC FU Call Status: Today's TOC FU Call Status:: Unsuccessful Call (2nd Attempt) Unsuccessful Call (1st Attempt) Date: 08/06/22 Unsuccessful Call (2nd Attempt) Date: 08/07/22  Attempted to reach the patient regarding the most recent Inpatient/ED visit.  Follow Up Plan: Additional outreach attempts will be made to reach the patient to complete the Transitions of Care (Post Inpatient/ED visit) call.   [place] LPN [place] Advisor Direct Dial [phone]

## 2022-08-11 NOTE — Transitions of Care (Post Inpatient/ED Visit) (Signed)
   08/11/2022  Name: Kaitlyn Gray MRN: [id number] DOB: March 31, 1976  Today's TOC FU Call Status: Today's TOC FU Call Status:: Unsuccessful Call (3rd Attempt) Unsuccessful Call (1st Attempt) Date: 08/06/22 Unsuccessful Call (2nd Attempt) Date: 08/07/22 Unsuccessful Call (3rd Attempt) Date: 08/11/22  Attempted to reach the patient regarding the most recent Inpatient/ED visit.  Follow Up Plan: No further outreach attempts will be made at this time. We have been unable to contact the patient.  [place] LPN [place] Advisor Direct Dial [phone]

## 2022-08-29 ENCOUNTER — Ambulatory Visit: Payer: Medicaid Other | Admitting: Family Medicine

## 2022-09-10 ENCOUNTER — Emergency Department (HOSPITAL_COMMUNITY)
Admission: EM | Admit: 2022-09-10 | Discharge: 2022-09-10 | Discharge status: Against Medical Advice | Payer: Medicaid Other | Attending: Emergency Medicine | Admitting: Emergency Medicine

## 2022-09-10 DIAGNOSIS — Y900 Blood alcohol level of less than 20 mg/100 ml: Secondary | ICD-10-CM | POA: Diagnosis not present

## 2022-09-10 DIAGNOSIS — R197 Diarrhea, unspecified: Secondary | ICD-10-CM | POA: Diagnosis not present

## 2022-09-10 DIAGNOSIS — R5381 Other malaise: Secondary | ICD-10-CM | POA: Insufficient documentation

## 2022-09-10 DIAGNOSIS — R4182 Altered mental status, unspecified: Secondary | ICD-10-CM | POA: Insufficient documentation

## 2022-09-10 DIAGNOSIS — R531 Weakness: Secondary | ICD-10-CM

## 2022-09-10 DIAGNOSIS — Z20822 Contact with and (suspected) exposure to covid-19: Secondary | ICD-10-CM | POA: Diagnosis not present

## 2022-09-10 DIAGNOSIS — Z5321 Procedure and treatment not carried out due to patient leaving prior to being seen by health care provider: Secondary | ICD-10-CM | POA: Insufficient documentation

## 2022-09-10 DIAGNOSIS — R4589 Other symptoms and signs involving emotional state: Secondary | ICD-10-CM

## 2022-09-10 DIAGNOSIS — R471 Dysarthria and anarthria: Secondary | ICD-10-CM

## 2022-09-10 LAB — RESP PANEL BY RT-PCR (RSV, FLU A&B, COVID)  RVPGX2
Influenza A by PCR: NEGATIVE
Influenza B by PCR: NEGATIVE
Resp Syncytial Virus by PCR: NEGATIVE
SARS Coronavirus 2 by RT PCR: NEGATIVE

## 2022-09-10 LAB — BASIC METABOLIC PANEL
Anion gap: 9 (ref 5–15)
BUN: 13 mg/dL (ref 6–20)
CO2: 24 mmol/L (ref 22–32)
Calcium: 8.5 mg/dL — ABNORMAL LOW (ref 8.9–10.3)
Chloride: 105 mmol/L (ref 98–111)
Creatinine, Ser: 0.75 mg/dL (ref 0.44–1.00)
GFR, Estimated: >60 mL/min (ref >60)
Glucose, Bld: 145 mg/dL — ABNORMAL HIGH (ref 70–99)
Potassium: 3.4 mmol/L — ABNORMAL LOW (ref 3.5–5.1)
Sodium: 138 mmol/L (ref 135–145)

## 2022-09-10 LAB — CBC WITH DIFFERENTIAL/PLATELET
Abs Immature Granulocytes: 0.01 10*3/uL (ref 0.00–0.07)
Basophils Absolute: 0 10*3/uL (ref 0.0–0.1)
Basophils Relative: 1 %
Eosinophils Absolute: 0.1 10*3/uL (ref 0.0–0.5)
Eosinophils Relative: 2 %
HCT: 27.4 % — ABNORMAL LOW (ref 36.0–46.0)
Hemoglobin: 8.1 g/dL — ABNORMAL LOW (ref 12.0–15.0)
Immature Granulocytes: 0 %
Lymphocytes Relative: 37 %
Lymphs Abs: 1.8 10*3/uL (ref 0.7–4.0)
MCH: 22.4 pg — ABNORMAL LOW (ref 26.0–34.0)
MCHC: 29.6 g/dL — ABNORMAL LOW (ref 30.0–36.0)
MCV: 75.9 fL — ABNORMAL LOW (ref 80.0–100.0)
Monocytes Absolute: 0.4 10*3/uL (ref 0.1–1.0)
Monocytes Relative: 8 %
Neutro Abs: 2.6 10*3/uL (ref 1.7–7.7)
Neutrophils Relative %: 52 %
Platelets: 382 10*3/uL (ref 150–400)
RBC: 3.61 MIL/uL — ABNORMAL LOW (ref 3.87–5.11)
RDW: 19.4 % — ABNORMAL HIGH (ref 11.5–15.5)
WBC: 4.9 10*3/uL (ref 4.0–10.5)
nRBC: 0 % (ref 0.0–0.2)

## 2022-09-10 LAB — CBG MONITORING, ED: Glucose-Capillary: 201 mg/dL — ABNORMAL HIGH (ref 70–99)

## 2022-09-10 LAB — MAGNESIUM: Magnesium: 1.7 mg/dL (ref 1.7–2.4)

## 2022-09-10 LAB — ETHANOL: Alcohol, Ethyl (B): <10 mg/dL (ref <10)

## 2022-09-10 NOTE — ED Notes (Signed)
Pt leaving AMA, risks/benefits explained. Pt refused to sign or have vitals taken before leaving.

## 2022-09-10 NOTE — ED Triage Notes (Addendum)
Pt called EMS for leg pain and diarrhea and general malaise. Unable to tell EMS how long. Pt has deficits per baseline stroke and aphasia. Pt refusing to answer questions when in the Ambulance. Hx substance abuse and stroke. Afebrile.

## 2022-09-10 NOTE — ED Notes (Signed)
Pt is talking in baby talk and asking for her "mommy"  and short sentences. Grinding teeth. Speech is comprehensible. Pt states it is 1999.

## 2022-09-10 NOTE — ED Provider Notes (Signed)
[place] EMERGENCY DEPARTMENT AT [facility] Provider Note   CSN: [id number] Arrival date & time: 09/10/22  1914     History  Chief Complaint  Patient presents with   Altered Mental Status    Kaitlyn Gray is a 47 y.o. 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  Per chart review patient was recently admitted from 08/04/2022 to 08/05/2022 after calling EMS for reported episode of weakness. Per the documentation EDP on arrival could not obtain a full review of systems due to AMS versus patient just patient, she had dysarthria/mumbling in the ED.  Workup was very reassuring including negative CT head, CBC/CMP, EtOH negative.  MRI of the brain showed no acute abnormality but did demonstrate known remote artifacts as well as severe right M2 stenosis and occlusion of right distal PCA at site of known prior infarction.  Ambulatory referral to PT and OT was recommended.  Today, pt called EMS for leg pain and diarrhea and general malaise. Unable to tell EMS how long. Pt has deficits per baseline stroke and aphasia. Pt refusing to answer questions when in the Ambulance.   On arrival to the emergency department patient has a very strange affect.  She is talking in baby talk, asking for her "mommy," and grinding her teeth very hard. She answers my questions, stating that the reason she is here is because her legs were weak earlier. She states she fell earlier trying to get to the bathroom and "hurts all over" but didn't hit her head or lose consciousness. She states she cannot move her bilateral legs very well but there is unclear onset of this problem. Denies any urinary/bowel incontinence/retention. Denies f/c, recent illnesses, CP, SOB. Patient is a very poor historian and is difficult to understand.    Altered Mental  Status      Home Medications Prior to Admission medications   Medication Sig Start Date End Date Taking? Authorizing Provider  amLODipine (NORVASC) 10 MG tablet Take 1 tablet (10 mg total) by mouth daily. 06/04/22 10/15/22  [place], Eric J, DO  ferrous sulfate 325 (65 FE) MG tablet Take 1 tablet (325 mg total) by mouth every other day. 06/04/22 09/02/22  [place], Alvira Philips, DO  gabapentin (NEURONTIN) 300 MG capsule Take 1 capsule (300 mg total) by mouth at bedtime. 06/04/22 10/15/22  [place], Alvira Philips, DO  losartan (COZAAR) 100 MG tablet Take 1 tablet (100 mg total) by mouth daily. 06/04/22 10/15/22  [place], Alvira Philips, DO  metFORMIN (GLUCOPHAGE) 500 MG tablet Take 1 tablet (500 mg total) by mouth 2 (two) times daily with a meal. 06/04/22 10/15/22  [place], Eric J, DO  pantoprazole (PROTONIX) 40 MG tablet Take 1 tablet (40 mg total) by mouth daily. 06/04/22 10/15/22  [place], Alvira Philips, DO  PARoxetine (PAXIL) 20 MG tablet Take 1 tablet (20 mg total) by mouth daily. 07/17/22   Georganna Skeans, MD  QUEtiapine (SEROQUEL) 200 MG tablet Take 1 tablet (200 mg total) by mouth at bedtime. 06/04/22 10/15/22  [place], Alvira Philips, DO  rosuvastatin (CRESTOR) 40 MG tablet Take 1 tablet (40 mg total) by mouth daily. 06/04/22 10/15/22  [place], Eric J, DO      Allergies    Hydrocodone and Latex    Review of Systems   Review of Systems Review of systems Negative for f/c.  A 10 point review  of systems was performed and is negative unless otherwise reported in HPI.  Physical Exam Updated Vital Signs BP (!) 177/103 (BP Location: Left Arm)   Pulse 71   Temp 97.9 F (36.6 C) (Oral)   Resp 17   SpO2 100%  Physical Exam General: Chronically ill-appearing female, lying in bed.  HEENT: PERRLA, EOMI, no nystagmus, Sclera anicteric, MMM, trachea midline. Repeatedly grinding teeth loudly. Tongue protrudes midline. NCAT, no midline C-spine TTP or stepoffs.  Cardiology: RRR, no murmurs/rubs/gallops. BL radial and DP pulses equal bilaterally.   Resp: Normal respiratory rate and effort. CTAB, no wheezes, rhonchi, crackles.  Abd: Soft, non-tender, non-distended. No rebound tenderness or guarding.  GU: Deferred. MSK: No peripheral edema or signs of trauma. Extremities without deformity or TTP. No cyanosis or clubbing. Skin: warm, dry. Back: No CVA tenderness Neuro: A&Ox4, CNs II-XII grossly intact. 3/5 strength in all four extremities, suspect subject to participation. Sensation grossly intact.   ED Results / Procedures / Treatments   Labs (all labs ordered are listed, but only abnormal results are displayed) Labs Reviewed  CBC WITH DIFFERENTIAL/PLATELET - Abnormal; Notable for the following components:      Result Value   RBC 3.61 (*)    Hemoglobin 8.1 (*)    HCT 27.4 (*)    MCV 75.9 (*)    MCH 22.4 (*)    MCHC 29.6 (*)    RDW 19.4 (*)    All other components within normal limits  BASIC METABOLIC PANEL - Abnormal; Notable for the following components:   Potassium 3.4 (*)    Glucose, Bld 145 (*)    Calcium 8.5 (*)    All other components within normal limits  CBG MONITORING, ED - Abnormal; Notable for the following components:   Glucose-Capillary 201 (*)    All other components within normal limits  RESP PANEL BY RT-PCR (RSV, FLU A&B, COVID)  RVPGX2  ETHANOL  MAGNESIUM    EKG EKG Interpretation  Date/Time:  Wednesday September 10 2022 19:21:55 EDT Ventricular Rate:  68 PR Interval:  136 QRS Duration: 107 QT Interval:  435 QTC Calculation: 463 R Axis:   37 Text Interpretation: Sinus rhythm Probable left ventricular hypertrophy No significant change since last tracing Confirmed by Elayne Snare ([id number]) on 09/11/2022 10:09:55 AM  Radiology No results found.  Procedures Procedures    Medications Ordered in ED Medications - No data to display  ED Course/ Medical Decision Making/ A&P                          Medical Decision Making Amount and/or Complexity of Data Reviewed Labs: ordered. Decision-making  details documented in ED Course.    This patient presents to the ED for concern of AMS/generalized weakness, this involves an extensive number of treatment options, and is a complaint that carries with it a high risk of complications and morbidity.  I considered the following differential and admission for this acute, potentially life threatening condition.   MDM:    On my evaluation, patient is actually A&Ox4, she answers year, location, and situation correctly.  Ddx of acute altered mental status or generalized weakness  considered but not limited to: -Consider patient's prior stroke causing aphasia/dysarthria, though patient does not appear to be terribly dysarthric, she is talking using "baby talk" and is marginally interpretable.  -There was no head trauma that patient reported, she is NCAT, low c/f ICH -Infection such as UTI - no tachypnea or abnormal  lung sounds, no report of cough/fever to indicate pneumonia -Toxic ingestion such as opioid overdose, anticholinergic toxicity, EtOH, other illicit substances given her history -Electrolyte abnormalities or hyper/hypoglycemia -ACS or arrhythmia -Anemia, electrolyte derangements  Per chart review and prior documentation, patient has previously also had very odd affect, lacked participation, and had been mumbling. Mri of her brain in February showed no acute abnormalities just her known remote infarcts. Reportedly had doubled her gabapentin dose which is what caused her AMS last time though she denies that this time.    Clinical Course as of 09/22/22 1827  Wed Sep 10, 2022  2048 Glucose-Capillary(!): 201 [HN]    Clinical Course User Index [HN] Loetta Rough, MD    Labs: I Ordered, and personally interpreted labs.  The pertinent results include: Glucose 145, K 3.4, Na 138, Cr 0.75, WBC 4.9, Hgb 8.1, resp viral neg,  EtoH neg   Additional history obtained from chart review.    Cardiac Monitoring: The patient was maintained on a  cardiac monitor.  I personally viewed and interpreted the cardiac monitored which showed an underlying rhythm of: NSR  Social Determinants of Health: Lives alone  Disposition:  Patient ultimately requests to leave the ED. She is observed to be walking around the room with her walker. She states she no longer feels weak and would like to leave the ED. given the lack of complete history obtained from the patient inability to fully rule out causes of her prior altered mental status or generalized weakness, I believe this patient should leave AGAINST MEDICAL ADVICE.  I discussed this with the patient, who is ANO x 4.  She repeated back to me the dangers of leaving including morbidity and mortality.  I believe the patient has capacity at this time and is able to leave AGAINST MEDICAL ADVICE.    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    Encounter for assessment of healthcare decision-making capacity    History of cervical fracture 12/24/2017   nondisplaced fracture lateral mass of C1 on the right on CT 12/24/17   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) 07/19/2017   Hypertension    Hypertensive emergency 05/31/2022   Ischemic stroke (HCC) 01/01/2020   subacute right middle cerebellar peduncle and pons infarction   Left-sided weakness 01/27/2022   Head CT with remote right occipital infarct which is consistent with old left-sided weakness   MDD (major depressive disorder), recurrent severe, 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   I have reviewed the patients home medicines and have made adjustments as needed  Problem List / ED Course: Problem List Items Addressed This Visit   None Visit Diagnoses     Generalized weakness    -  Primary   Dysarthria       Disturbance in affect  This note was created using dictation software, which may contain spelling or grammatical errors.    Loetta RoughNaasz, [place] N, MD 09/22/22 [phone]

## 2022-09-15 ENCOUNTER — Other Ambulatory Visit: Payer: Self-pay

## 2022-09-23 ENCOUNTER — Telehealth: Payer: Self-pay | Admitting: Pharmacist

## 2022-09-23 NOTE — Progress Notes (Signed)
Patient attempted to be outreached by Michiel Cowboy, PharmD Candidate on 09/23/2022 to discuss hypertension. Voicemail box full at this time, could not leave message.   Michiel Cowboy, PharmD Candidate   Catie Eppie Gibson, PharmD, BCACP, CPP [facility] [phone]

## 2022-09-25 ENCOUNTER — Other Ambulatory Visit (HOSPITAL_COMMUNITY): Payer: Self-pay

## 2022-09-29 ENCOUNTER — Ambulatory Visit: Payer: Medicaid Other | Admitting: Family Medicine

## 2022-10-10 ENCOUNTER — Other Ambulatory Visit: Payer: Self-pay | Admitting: Family Medicine

## 2022-10-10 ENCOUNTER — Other Ambulatory Visit: Payer: Self-pay

## 2022-10-10 ENCOUNTER — Other Ambulatory Visit (HOSPITAL_COMMUNITY): Payer: Self-pay

## 2022-10-10 MED ORDER — PAROXETINE HCL 20 MG PO TABS
20.0000 mg | ORAL_TABLET | Freq: Every day | ORAL | 0 refills | Status: DC
  Filled 2022-10-10: qty 90, 90d supply, fill #0

## 2022-10-10 NOTE — Telephone Encounter (Signed)
Requested Prescriptions  Pending Prescriptions Disp Refills   PARoxetine (PAXIL) 20 MG tablet 90 tablet 0    Sig: Take 1 tablet (20 mg total) by mouth daily.     Psychiatry:  Antidepressants - SSRI Passed - 10/10/2022 12:07 PM      Passed - Completed PHQ-2 or PHQ-9 in the last 360 days      Passed - Valid encounter within last 6 months    Recent Outpatient Visits           2 months ago Hypertension associated with diabetes [facility])   [place], MD       Future Appointments             In 3 days Georganna Skeans, MD [facility]

## 2022-10-13 ENCOUNTER — Other Ambulatory Visit: Payer: Self-pay

## 2022-10-13 ENCOUNTER — Encounter: Payer: Medicaid Other | Admitting: Family Medicine

## 2022-10-13 NOTE — Progress Notes (Unsigned)
Per provider,patient did not come on video Kaitlyn Gray

## 2022-10-14 NOTE — Progress Notes (Signed)
Patient did not connect for the video visit

## 2022-10-23 ENCOUNTER — Emergency Department (HOSPITAL_COMMUNITY)
Admission: EM | Admit: 2022-10-23 | Discharge: 2022-10-24 | Disposition: A | Discharge status: Home / Self Care | Payer: Medicaid Other | Attending: Emergency Medicine | Admitting: Emergency Medicine

## 2022-10-23 ENCOUNTER — Emergency Department (HOSPITAL_COMMUNITY): Payer: Medicaid Other

## 2022-10-23 ENCOUNTER — Encounter (HOSPITAL_COMMUNITY): Payer: Self-pay

## 2022-10-23 ENCOUNTER — Other Ambulatory Visit: Payer: Self-pay

## 2022-10-23 DIAGNOSIS — R4781 Slurred speech: Secondary | ICD-10-CM | POA: Insufficient documentation

## 2022-10-23 DIAGNOSIS — D649 Anemia, unspecified: Secondary | ICD-10-CM | POA: Diagnosis not present

## 2022-10-23 DIAGNOSIS — Z8673 Personal history of transient ischemic attack (TIA), and cerebral infarction without residual deficits: Secondary | ICD-10-CM | POA: Insufficient documentation

## 2022-10-23 DIAGNOSIS — I10 Essential (primary) hypertension: Secondary | ICD-10-CM | POA: Insufficient documentation

## 2022-10-23 DIAGNOSIS — R531 Weakness: Secondary | ICD-10-CM

## 2022-10-23 LAB — CBC
HCT: 28 % — ABNORMAL LOW (ref 36.0–46.0)
Hemoglobin: 8.3 g/dL — ABNORMAL LOW (ref 12.0–15.0)
MCH: 22 pg — ABNORMAL LOW (ref 26.0–34.0)
MCHC: 29.6 g/dL — ABNORMAL LOW (ref 30.0–36.0)
MCV: 74.3 fL — ABNORMAL LOW (ref 80.0–100.0)
Platelets: 271 10*3/uL (ref 150–400)
RBC: 3.77 MIL/uL — ABNORMAL LOW (ref 3.87–5.11)
RDW: 18.7 % — ABNORMAL HIGH (ref 11.5–15.5)
WBC: 4.3 10*3/uL (ref 4.0–10.5)
nRBC: 0 % (ref 0.0–0.2)

## 2022-10-23 LAB — COMPREHENSIVE METABOLIC PANEL
ALT: 12 U/L (ref 0–44)
AST: 16 U/L (ref 15–41)
Albumin: 3 g/dL — ABNORMAL LOW (ref 3.5–5.0)
Alkaline Phosphatase: 58 U/L (ref 38–126)
Anion gap: 10 (ref 5–15)
BUN: 15 mg/dL (ref 6–20)
CO2: 27 mmol/L (ref 22–32)
Calcium: 9.1 mg/dL (ref 8.9–10.3)
Chloride: 104 mmol/L (ref 98–111)
Creatinine, Ser: 0.69 mg/dL (ref 0.44–1.00)
GFR, Estimated: >60 mL/min (ref >60)
Glucose, Bld: 110 mg/dL — ABNORMAL HIGH (ref 70–99)
Potassium: 3.2 mmol/L — ABNORMAL LOW (ref 3.5–5.1)
Sodium: 141 mmol/L (ref 135–145)
Total Bilirubin: 0.1 mg/dL — ABNORMAL LOW (ref 0.3–1.2)
Total Protein: 6.3 g/dL — ABNORMAL LOW (ref 6.5–8.1)

## 2022-10-23 LAB — I-STAT CHEM 8, ED
BUN: 15 mg/dL (ref 6–20)
Calcium, Ion: 1.22 mmol/L (ref 1.15–1.40)
Chloride: 103 mmol/L (ref 98–111)
Creatinine, Ser: 0.7 mg/dL (ref 0.44–1.00)
Glucose, Bld: 105 mg/dL — ABNORMAL HIGH (ref 70–99)
HCT: 28 % — ABNORMAL LOW (ref 36.0–46.0)
Hemoglobin: 9.5 g/dL — ABNORMAL LOW (ref 12.0–15.0)
Potassium: 3.2 mmol/L — ABNORMAL LOW (ref 3.5–5.1)
Sodium: 141 mmol/L (ref 135–145)
TCO2: 27 mmol/L (ref 22–32)

## 2022-10-23 LAB — I-STAT BETA HCG BLOOD, ED (MC, WL, AP ONLY): I-stat hCG, quantitative: <5 m[IU]/mL (ref <5)

## 2022-10-23 LAB — APTT: aPTT: 28 seconds (ref 24–36)

## 2022-10-23 LAB — CBG MONITORING, ED: Glucose-Capillary: 104 mg/dL — ABNORMAL HIGH (ref 70–99)

## 2022-10-23 LAB — DIFFERENTIAL
Abs Immature Granulocytes: 0 10*3/uL (ref 0.00–0.07)
Basophils Absolute: 0 10*3/uL (ref 0.0–0.1)
Basophils Relative: 1 %
Eosinophils Absolute: 0.1 10*3/uL (ref 0.0–0.5)
Eosinophils Relative: 3 %
Immature Granulocytes: 0 %
Lymphocytes Relative: 40 %
Lymphs Abs: 1.7 10*3/uL (ref 0.7–4.0)
Monocytes Absolute: 0.4 10*3/uL (ref 0.1–1.0)
Monocytes Relative: 8 %
Neutro Abs: 2.1 10*3/uL (ref 1.7–7.7)
Neutrophils Relative %: 48 %

## 2022-10-23 LAB — PROTIME-INR
INR: 1.1 (ref 0.8–1.2)
Prothrombin Time: 14.2 seconds (ref 11.4–15.2)

## 2022-10-23 LAB — ETHANOL: Alcohol, Ethyl (B): <10 mg/dL (ref <10)

## 2022-10-23 MED ORDER — AMMONIA AROMATIC IN INHA
1.0000 | Freq: Once | RESPIRATORY_TRACT | Status: AC
  Administered 2022-10-24: 1 via RESPIRATORY_TRACT

## 2022-10-23 MED ORDER — ONDANSETRON HCL 4 MG/2ML IJ SOLN
4.0000 mg | Freq: Once | INTRAMUSCULAR | Status: AC
  Administered 2022-10-23: 4 mg via INTRAVENOUS
  Filled 2022-10-23: qty 2

## 2022-10-23 MED ORDER — LORAZEPAM 2 MG/ML IJ SOLN
1.0000 mg | Freq: Once | INTRAMUSCULAR | Status: AC
  Administered 2022-10-23: 1 mg via INTRAVENOUS
  Filled 2022-10-23: qty 1

## 2022-10-23 MED ORDER — KETOROLAC TROMETHAMINE 30 MG/ML IJ SOLN
30.0000 mg | Freq: Once | INTRAMUSCULAR | Status: AC
  Administered 2022-10-23: 30 mg via INTRAVENOUS
  Filled 2022-10-23: qty 1

## 2022-10-23 NOTE — ED Provider Notes (Signed)
Emergency Department Provider Note   I have reviewed the triage vital signs and the nursing notes.   HISTORY  Chief Complaint Code Stroke   HPI Kaitlyn Gray is a 47 y.o. female past history reviewed below including cocaine use and prior CVA presents to the emergency department for evaluation of worsening left-sided weakness and slurred speech.  Symptoms been waxing and waning. She awoke at [facility] "not feeling well" and ultimately called EMS. Patient with history of CVA but also psychogenic symptoms.    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    Encounter for assessment of healthcare decision-making capacity    History of cervical fracture 12/24/2017   nondisplaced fracture lateral mass of C1 on the right on CT 12/24/17   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) 07/19/2017   Hypertension    Hypertensive emergency 05/31/2022   Ischemic stroke (HCC) 01/01/2020   subacute right middle cerebellar peduncle and pons infarction   Left-sided weakness 01/27/2022   Head CT with remote right occipital infarct which is consistent with old left-sided weakness   MDD (major depressive disorder), recurrent severe, 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   ____________________________________________   PHYSICAL EXAM:  VITAL SIGNS: Vitals:   10/23/22 1915 10/23/22 1930  BP: (!) 182/93 (!) 172/102  Pulse: 80 78  Resp: 20 17  SpO2: 100% 100%     Constitutional: Drowsy but arouses to voice with baby talk and waxing/waning symptoms.  Eyes: Conjunctivae are normal. Head: Atraumatic. Nose: No congestion/rhinnorhea. Mouth/Throat: Mucous membranes are moist. Neck: No stridor.   Cardiovascular: Normal rate, regular rhythm. Good peripheral circulation. Grossly normal heart sounds.   Respiratory: Normal respiratory effort.  No retractions. Lungs CTAB. Gastrointestinal: Soft and nontender. No distention.  Musculoskeletal: No lower extremity tenderness nor edema. No gross deformities of extremities. Neurologic: Baby talk like speech cadence. Spontaneous movement noted to hands/feet.  Skin:  Skin is warm, dry and intact. No rash noted.   ____________________________________________   LABS (all labs ordered are listed, but only abnormal results are displayed)  Labs Reviewed  CBC - Abnormal; Notable for the following components:      Result Value   RBC 3.77 (*)    Hemoglobin 8.3 (*)    HCT 28.0 (*)    MCV 74.3 (*)    MCH 22.0 (*)    MCHC 29.6 (*)    RDW 18.7 (*)    All other components within normal limits  COMPREHENSIVE METABOLIC PANEL - Abnormal; Notable for the following components:   Potassium 3.2 (*)    Glucose, Bld 110 (*)    Total Protein 6.3 (*)    Albumin 3.0 (*)    Total Bilirubin 0.1 (*)    All other components within normal limits  CBG MONITORING, ED - Abnormal; Notable for the following components:   Glucose-Capillary 104 (*)    All other components within normal limits  I-STAT CHEM 8, ED - Abnormal; Notable for the  following components:   Potassium 3.2 (*)    Glucose, Bld 105 (*)    Hemoglobin 9.5 (*)    HCT 28.0 (*)    All other components within normal limits  ETHANOL  PROTIME-INR  APTT  DIFFERENTIAL  RAPID URINE DRUG SCREEN, HOSP PERFORMED  URINALYSIS, ROUTINE W REFLEX MICROSCOPIC  I-STAT BETA HCG BLOOD, ED ([facility], [facility], [facility] ONLY)   ____________________________________________  RADIOLOGY  MR BRAIN  WO CONTRAST  Result Date: 10/23/2022 CLINICAL DATA:  Altered mental status EXAM: MRI HEAD WITHOUT CONTRAST TECHNIQUE: Multiplanar, multiecho pulse sequences of the brain and surrounding structures were obtained without intravenous contrast. COMPARISON:  08/04/2022 FINDINGS: Brain: No acute infarct, mass effect or extra-axial collection. Multifocal chronic microhemorrhage in the right cerebellum and pons. There is multifocal hyperintense T2-weighted signal within the white matter. Generalized volume loss greater than expected for age. Old right occipital and left parietal infarcts. Multiple old right cerebellar small vessel infarcts. The midline structures are normal. Vascular: Major flow voids are preserved. Skull and upper cervical spine: Normal calvarium and skull base. Visualized upper cervical spine and soft tissues are normal. Sinuses/Orbits:No paranasal sinus fluid levels or advanced mucosal thickening. No mastoid or middle ear effusion. Normal orbits. IMPRESSION: 1. No acute intracranial abnormality. 2. Old right occipital and left parietal infarcts. 3. Multiple old right cerebellar small vessel infarcts. 4. Generalized volume loss greater than expected for age. Electronically Signed   By: Deatra Robinson M.D.   On: 10/23/2022 21:25   CT HEAD CODE STROKE WO CONTRAST  Result Date: 10/23/2022 CLINICAL DATA:  Code stroke. Neuro deficit, acute, stroke suspected. Aphasia. Left-sided weakness. EXAM: CT HEAD WITHOUT CONTRAST TECHNIQUE: Contiguous axial images were obtained from the base of the skull through the vertex without intravenous contrast. RADIATION DOSE REDUCTION: This exam was performed according to the departmental dose-optimization program which includes automated exposure control, adjustment of the mA and/or kV according to patient size and/or use of iterative reconstruction technique. COMPARISON:  Brain MRI 08/04/2022. CT angiogram head/neck 08/04/2022. Head CT 08/04/2022. FINDINGS: Brain: No age  advanced or lobar predominant parenchymal atrophy. Known small chronic cortical/subcortical infarct within the medial left frontoparietal lobes (with involvement of the pre and postcentral gyri). Known small chronic cortical/subcortical infarct within the right occipital lobe. Known chronic infarcts within the right cerebellar hemisphere and right middle cerebellar peduncle. Chronic right basal ganglia lacunar infarcts, better appreciated on the prior brain MRI of 08/04/2022. Background patchy and ill-defined hypoattenuation within the cerebral white matter, nonspecific but compatible with moderate chronic small vessel ischemic disease. There is no acute intracranial hemorrhage. No acute demarcated cortical infarct. No extra-axial fluid collection. No evidence of an intracranial mass. No midline shift. Vascular: No hyperdense vessel.  Atherosclerotic calcifications Skull: No fracture or aggressive osseous lesion. Sinuses/Orbits: No mass or acute finding within the imaged orbits. Mucous retention cysts within the right maxillary sinus measuring up to 13 mm at the imaged levels. Small mucous retention cyst, and mild mucosal thickening, within the left maxillary sinus at the imaged levels. Small mucous retention cyst within the right sphenoid sinus. ASPECTS [facility] Stroke Program Early CT Score) - Ganglionic level infarction (caudate, lentiform nuclei, internal capsule, insula, M1-M3 cortex): 7 - Supraganglionic infarction (M4-M6 cortex): 3 Total score (0-10 with 10 being normal): 10 (when discounting chronic infarcts). No evidence of an acute intracranial abnormality. These results were communicated to Dr. Amada Jupiter at 6:43 pmon 5/9/2024by text page via the [facility] messaging system. IMPRESSION: 1. No evidence of an acute intracranial abnormality. 2.  Chronic small vessel ischemic disease and chronic infarcts as described. 3. Paranasal sinus disease at the imaged levels as described. Electronically Signed   By: Jackey Loge D.O.   On: 10/23/2022 18:44    ____________________________________________   PROCEDURES  Procedure(s) performed:   Procedures  None ____________________________________________   INITIAL IMPRESSION / ASSESSMENT AND PLAN / ED COURSE  Pertinent labs & imaging results that were available during my care of the patient were reviewed by me and considered in my medical decision making (see chart for details).   This patient is Presenting for Evaluation of AMS, which does require a range of treatment options, and is a complaint that involves a high risk of morbidity and mortality.  The Differential Diagnoses includes but is not exclusive to alcohol, illicit or prescription medications, intracranial pathology such as stroke, intracerebral hemorrhage, fever or infectious causes including sepsis, hypoxemia, uremia, trauma, endocrine related disorders such as diabetes, hypoglycemia, thyroid-related diseases, etc.   Critical Interventions-    Medications  ketorolac (TORADOL) 30 MG/ML injection 30 mg (30 mg Intravenous Given 10/23/22 2119)  ondansetron (ZOFRAN) injection 4 mg (4 mg Intravenous Given 10/23/22 2119)  LORazepam (ATIVAN) injection 1 mg (1 mg Intravenous Given 10/23/22 2030)    Reassessment after intervention: symptoms improved.    I did obtain Additional Historical Information from EMS.   I decided to review pertinent External Data, and in summary patient with prior evaluations for CVA and stroke-like symptoms.    Clinical Laboratory Tests Ordered, included CBC without leukocytosis.  Mild anemia to 8.3.  No acute kidney injury.  Radiologic Tests Ordered, included CT head and MRI brain. I independently interpreted the images and agree with radiology interpretation.   Cardiac Monitor Tracing which shows NSR.    Social Determinants of Health Risk patient is a smoker.   Consult complete with Neurology, Dr. Amada Jupiter. No TNK advised. Will follow MRI and given HA  cocktail.   Medical Decision Making: Summary:  Patient presents emergency department for evaluation of worsening stroke type symptoms.  Exam is somewhat non-consistent. Plan for labs and MRI.   Reevaluation with update and discussion with patient.  She is drowsy but awakens to voice and following some commands.  Spontaneously moving arms and legs without difficulty.  Updated her that her MRI shows no evidence of stroke.  She is ready for discharge.  Advised her to reach out to her neurology and primary care doctors  Considered admission but no evidence of CVA on MRI. Plan for discharge.   Patient's presentation is most consistent with acute presentation with potential threat to life or bodily function.   Disposition: discharge  ____________________________________________  FINAL CLINICAL IMPRESSION(S) / ED DIAGNOSES  Final diagnoses:  Slurred speech    Note:  This document was prepared using Dragon voice recognition software and may include unintentional dictation errors.  Alona Bene, MD, [facility] Emergency Medicine    Harper Vandervoort, Arlyss Repress, MD 10/23/22 [phone]

## 2022-10-23 NOTE — ED Triage Notes (Signed)
Patient bib [facility] after she woke up at 2pm not feeling well. EMS states Patient has left arm weakness and aphasic.

## 2022-10-23 NOTE — Consult Note (Signed)
Neurology Consultation Reason for Consult: code stroke Referring Physician: Dr. Jacqulyn Bath  CC: left sided weakness, aphasia  History is obtained from: patient and chart review  HPI: Kaitlyn Gray is a 47 y.o. African American female with PMH of cocaine use, HTN, DM2, bipolar disorder, and previous stroke presenting as a Code stroke with worsening weakness, aphasia from baseline. Per EMS, patient called them herself due to left-sided weakness and slurred speech.  On exam at ED, patient has incomprehensible to slurred speech (waxing and waning in intensity), subjective numbness of left side, unable to hold any extremity in the air, unable/uncooperative with VF/ataxia testing.   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  MRI showed acute/subacute demyelination vs Subacute infarction R middle cerebral peduncle.  MRA showed Multifocal intracranial atherosclerotic disease vs vasculitis vs cocaine induced vasculopathy, including left ICA siphon moderate stenosis, left M2, right P2 and left P4 moderate to severe stenosis.  Carotid Doppler negative.  EF 55 to 60%.  LP w/ slightly elevated protein 69, WBC 4, negative oligoclonol bands.  UDS positive for cocaine.  A1c 7.5, LDL 122.  ANA positive.  Patient left AMA at that time.  Patient diagnosis most likely due to ischemic infarction. 02/09/2020 admitted for right pontine infarct. CT showed right middle cerebellar peduncle old infarct. MRI acute R pontine infarct with evolving right cerebellar peduncle ischemic abnormality. LDL 75 and A1C 6.2. put on DAPT for 3 week then ASA alone. 08/2020 admitted for worsening weakness on the left.  Found to have extensive left-sided pain also.  MRI no acute infarct.  CT head and neck right ICA 50% stenosis, right P2 stenosis, multifocal intracranial  stenosis.  LDL 102, A1c 7.3.  Patient continued on aspirin 81 03/2021 admitted again for confusion and slurred speech, with "baby voice".  MRI again no acute infarct. 10/2021 -ED visit for left-sided worsening weakness and left leg/foot pain.  CT head and neck no LVO and right ICA similar 50% stenosis.  MRI no acute infarct.  UDS positive for cocaine again.  Continued on aspirin and Lipitor 01/2022 ED visit for left-sided weakness.  MRI negative for acute infarct. 03/2022 admitted for right leg weakness.  MRI showed scattered bilateral ACA infarcts.  CTA head and neck right ICA 70% stenosis and bilateral A2/A3 stenosis, moderate stenosis of right PCA P1 and P2 with occlusion of P3.  EF 70 to 75%.  DVT negative.  LDL 77, A1c 6.6, discharged on DAPT for 3 months and then Plavix alone.  Also Crestor 40.  Educated on smoking cessation and cocaine cessation. 07/2022: Came in as code stroke with left-sided weakness and dysconjugate gaze. CT no acute finding, CTA head and neck suboptimal due to bolus timing but no LVO. MRI done per wake up protocol found no acute infarct. UDS positive for cocaine.   ROS:   Unable to obtain due to altered mental status.   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    Encounter for assessment of healthcare decision-making capacity    History of cervical fracture 12/24/2017   nondisplaced fracture lateral mass of C1 on  the right on CT 12/24/17   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) 07/19/2017   Hypertension    Hypertensive emergency 05/31/2022   Ischemic stroke (HCC) 01/01/2020   subacute right middle cerebellar peduncle and pons infarction   Left-sided weakness 01/27/2022   Head  CT with remote right occipital infarct which is consistent with old left-sided weakness   MDD (major depressive disorder), recurrent severe, 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 She has been smoking an average of .5 packs per day. She has never used smokeless tobacco. She reports current drug use. Drugs: Marijuana and Cocaine. She reports that she does not drink alcohol.  Exam: Current vital signs: SpO2 99%  Vital signs in last 24 hours: SpO2:  [99 %] 99 % (05/09 1841)   Physical Exam  Appears well-developed and well-nourished.   Neuro: Mental Status: Patient is awake, follow most commands. Expressive aphasia present with speech varying from slurred to incomprehensible.   Cranial Nerves: II: .PERRL.  III,IV, VI: EOMI with inconsistent tracking of examiner.   V: Facial sensation is symmetric to temperature VII: Facial movement is inconsistent, varying from symmetric to a drooped appearance.  VIII: hearing is intact to voice X: Uvula elevates symmetrically XI: Shoulder shrug is symmetric. XII: tongue is midline without atrophy or fasciculations.  Motor: Unable to lift/hold any extremity in the air. Some slight spontaneous movement seen in bilateral feet/lower legs.  Sensory: Sensation is subjectively decreased on the left side Cerebellar: Unable to assess/Uncooperative  I have reviewed labs in epic and the results pertinent to this consultation are: Hgb: 8.3 HCT: 28.0   I have reviewed the images obtained:  CT Head:  1. No evidence of an acute intracranial abnormality. 2. Chronic small vessel  ischemic disease and chronic infarcts as described. 3. Paranasal sinus disease at the imaged levels as described.  MRI Brain: pending   Impression:  Stroke vs Pseudostroke/FND vs Neuropsych effects of cocaine use, pending testing  Recommendations: - MRI Brain  If positive, will need to be admitted for stroke work-up - UDS - UA/CXR to rule out infectious processes - Substance abuse cessation education - Normotensive BP goals  Mauri Reading, NP   I have seen the patient and reviewed the above note.  She has a history of both stroke and pseudo stroke, and presents with exam findings that are markedly inconsistent, as well as "baby talk."  I strongly suspect a psychogenic etiology to her presentation.  She is outside the TNK window in any case.  She also complains of a unilateral headache with photophobia.  I do think that further evaluation with MRI would be prudent given her history, but if negative then I would treat this is complicated migraine, though purely psychogenic etiology is also certainly possible.  Please call neurology if MRI is positive.  Ritta Slot, MD [facility] [phone]  If 7pm- 7am, please page neurology on call as listed in [facility]ION.

## 2022-10-23 NOTE — Code Documentation (Signed)
Stroke Response Nurse Documentation Code Documentation  Kaitlyn Gray is a 47 y.o. female arriving to [facility]  via [facility] on 5/9 with past medical hx of bipolar disorder, hypertension, CVA, polysubstance abuse. On No antithrombotic. Code stroke was activated by EMS.   Patient from home where she was LKW at the time she went to bed last night. She is unable to provide this time to [place], and now complaining of weakness and aphasia. Patient reports that she went to bed normal last night and when she woke up this morning she did not feel like herself. She subsequently went back to bed and woke up at 1400 and had a headache, aphasia, and L arm weakness.    Stroke team at the bedside on patient arrival. Labs drawn and patient cleared for CT by Dr. Freida Busman. Patient to CT with team. NIHSS 10, see documentation for details and code stroke times. Patient with left arm weakness, bilateral leg weakness, left decreased sensation, and dysarthria  on exam. The following imaging was completed:  CT Head. Patient is not a candidate for IV Thrombolytic due to being outside the treatment window. Patient is not not a candidate for IR due to no LVO suspected per MD.   Care Plan: q2 hr neuro checks, MRI.   Bedside handoff with ED RN Charlsie Quest  Stroke Response RN

## 2022-10-23 NOTE — ED Notes (Signed)
Patient transported to MRI 

## 2022-10-23 NOTE — Discharge Instructions (Signed)
Your MRI did not show any evidence of stroke today.  Please continue to follow with your primary care doctor as well as your neurologist.  Return with any new or suddenly worsening symptoms.

## 2022-10-23 NOTE — ED Notes (Signed)
Ptar called, no eta 

## 2022-10-23 NOTE — ED Notes (Signed)
Patient back from MRI.

## 2022-10-24 NOTE — ED Notes (Signed)
PTAR arrived to pick the patient up and take her to her house and patient acted unresponsive. Patient was seen visibly from the door 15 minutes prior looking around and sitting up, RN notified EDP. Ammonia inhalant was ordered and given, patient started screaming "Stop it Im awake!"

## 2022-11-20 ENCOUNTER — Emergency Department (HOSPITAL_COMMUNITY)
Admission: EM | Admit: 2022-11-20 | Discharge: 2022-11-24 | Disposition: A | Discharge status: Home / Self Care | Payer: Medicaid Other | Attending: Emergency Medicine | Admitting: Emergency Medicine

## 2022-11-20 ENCOUNTER — Emergency Department (HOSPITAL_COMMUNITY): Payer: Medicaid Other

## 2022-11-20 ENCOUNTER — Encounter (HOSPITAL_COMMUNITY): Payer: Self-pay

## 2022-11-20 ENCOUNTER — Other Ambulatory Visit: Payer: Self-pay

## 2022-11-20 DIAGNOSIS — I10 Essential (primary) hypertension: Secondary | ICD-10-CM | POA: Diagnosis not present

## 2022-11-20 DIAGNOSIS — F323 Major depressive disorder, single episode, severe with psychotic features: Secondary | ICD-10-CM | POA: Insufficient documentation

## 2022-11-20 DIAGNOSIS — Z91040 Latex allergy status: Secondary | ICD-10-CM | POA: Insufficient documentation

## 2022-11-20 DIAGNOSIS — Z7984 Long term (current) use of oral hypoglycemic drugs: Secondary | ICD-10-CM | POA: Diagnosis not present

## 2022-11-20 DIAGNOSIS — F1420 Cocaine dependence, uncomplicated: Secondary | ICD-10-CM | POA: Diagnosis not present

## 2022-11-20 DIAGNOSIS — F250 Schizoaffective disorder, bipolar type: Secondary | ICD-10-CM | POA: Diagnosis present

## 2022-11-20 DIAGNOSIS — F319 Bipolar disorder, unspecified: Secondary | ICD-10-CM | POA: Diagnosis present

## 2022-11-20 DIAGNOSIS — E119 Type 2 diabetes mellitus without complications: Secondary | ICD-10-CM | POA: Insufficient documentation

## 2022-11-20 DIAGNOSIS — F1910 Other psychoactive substance abuse, uncomplicated: Secondary | ICD-10-CM | POA: Diagnosis present

## 2022-11-20 DIAGNOSIS — Z79899 Other long term (current) drug therapy: Secondary | ICD-10-CM | POA: Insufficient documentation

## 2022-11-20 DIAGNOSIS — Z91148 Patient's other noncompliance with medication regimen for other reason: Secondary | ICD-10-CM

## 2022-11-20 DIAGNOSIS — F315 Bipolar disorder, current episode depressed, severe, with psychotic features: Secondary | ICD-10-CM | POA: Diagnosis not present

## 2022-11-20 DIAGNOSIS — E11649 Type 2 diabetes mellitus with hypoglycemia without coma: Secondary | ICD-10-CM | POA: Diagnosis not present

## 2022-11-20 DIAGNOSIS — R531 Weakness: Secondary | ICD-10-CM | POA: Diagnosis present

## 2022-11-20 DIAGNOSIS — F1220 Cannabis dependence, uncomplicated: Secondary | ICD-10-CM | POA: Diagnosis not present

## 2022-11-20 DIAGNOSIS — F99 Mental disorder, not otherwise specified: Secondary | ICD-10-CM

## 2022-11-20 LAB — BASIC METABOLIC PANEL
Anion gap: 10 (ref 5–15)
BUN: 16 mg/dL (ref 6–20)
CO2: 26 mmol/L (ref 22–32)
Calcium: 9 mg/dL (ref 8.9–10.3)
Chloride: 108 mmol/L (ref 98–111)
Creatinine, Ser: 0.72 mg/dL (ref 0.44–1.00)
GFR, Estimated: >60 mL/min (ref >60)
Glucose, Bld: 103 mg/dL — ABNORMAL HIGH (ref 70–99)
Potassium: 3 mmol/L — ABNORMAL LOW (ref 3.5–5.1)
Sodium: 144 mmol/L (ref 135–145)

## 2022-11-20 LAB — HEPATIC FUNCTION PANEL
ALT: 18 U/L (ref 0–44)
AST: 19 U/L (ref 15–41)
Albumin: 3.2 g/dL — ABNORMAL LOW (ref 3.5–5.0)
Alkaline Phosphatase: 60 U/L (ref 38–126)
Bilirubin, Direct: <0.1 mg/dL (ref 0.0–0.2)
Total Bilirubin: 0.1 mg/dL — ABNORMAL LOW (ref 0.3–1.2)
Total Protein: 6.9 g/dL (ref 6.5–8.1)

## 2022-11-20 LAB — URINALYSIS, ROUTINE W REFLEX MICROSCOPIC
Bilirubin Urine: NEGATIVE
Glucose, UA: NEGATIVE mg/dL
Hgb urine dipstick: NEGATIVE
Ketones, ur: NEGATIVE mg/dL
Leukocytes,Ua: NEGATIVE
Nitrite: NEGATIVE
Protein, ur: NEGATIVE mg/dL
Specific Gravity, Urine: 1.02 (ref 1.005–1.030)
pH: 6 (ref 5.0–8.0)

## 2022-11-20 LAB — TROPONIN I (HIGH SENSITIVITY)
Troponin I (High Sensitivity): 6 ng/L (ref <18)
Troponin I (High Sensitivity): 7 ng/L (ref <18)

## 2022-11-20 LAB — CBC
HCT: 29.5 % — ABNORMAL LOW (ref 36.0–46.0)
Hemoglobin: 8.5 g/dL — ABNORMAL LOW (ref 12.0–15.0)
MCH: 21.4 pg — ABNORMAL LOW (ref 26.0–34.0)
MCHC: 28.8 g/dL — ABNORMAL LOW (ref 30.0–36.0)
MCV: 74.1 fL — ABNORMAL LOW (ref 80.0–100.0)
Platelets: 302 10*3/uL (ref 150–400)
RBC: 3.98 MIL/uL (ref 3.87–5.11)
RDW: 18.3 % — ABNORMAL HIGH (ref 11.5–15.5)
WBC: 4.7 10*3/uL (ref 4.0–10.5)
nRBC: 0 % (ref 0.0–0.2)

## 2022-11-20 LAB — MAGNESIUM: Magnesium: 2 mg/dL (ref 1.7–2.4)

## 2022-11-20 LAB — I-STAT BETA HCG BLOOD, ED (MC, WL, AP ONLY): I-stat hCG, quantitative: <5 m[IU]/mL (ref <5)

## 2022-11-20 MED ORDER — LACTATED RINGERS IV BOLUS
1000.0000 mL | Freq: Once | INTRAVENOUS | Status: AC
  Administered 2022-11-20: 1000 mL via INTRAVENOUS

## 2022-11-20 MED ORDER — POTASSIUM CHLORIDE 10 MEQ/100ML IV SOLN
10.0000 meq | Freq: Once | INTRAVENOUS | Status: AC
  Administered 2022-11-20: 10 meq via INTRAVENOUS
  Filled 2022-11-20: qty 100

## 2022-11-20 MED ORDER — ONDANSETRON HCL 4 MG/2ML IJ SOLN
4.0000 mg | Freq: Once | INTRAMUSCULAR | Status: AC
  Administered 2022-11-20: 4 mg via INTRAVENOUS
  Filled 2022-11-20: qty 2

## 2022-11-20 MED ORDER — LOSARTAN POTASSIUM 50 MG PO TABS
100.0000 mg | ORAL_TABLET | Freq: Every day | ORAL | Status: DC
  Administered 2022-11-20 – 2022-11-24 (×5): 100 mg via ORAL
  Filled 2022-11-20 (×5): qty 2

## 2022-11-20 MED ORDER — PAROXETINE HCL 20 MG PO TABS
20.0000 mg | ORAL_TABLET | Freq: Every day | ORAL | Status: DC
  Administered 2022-11-20 – 2022-11-24 (×5): 20 mg via ORAL
  Filled 2022-11-20 (×5): qty 1

## 2022-11-20 MED ORDER — QUETIAPINE FUMARATE 200 MG PO TABS
200.0000 mg | ORAL_TABLET | Freq: Every day | ORAL | Status: DC
  Administered 2022-11-20 – 2022-11-23 (×4): 200 mg via ORAL
  Filled 2022-11-20: qty 1
  Filled 2022-11-20: qty 2
  Filled 2022-11-20 (×2): qty 1

## 2022-11-20 MED ORDER — PANTOPRAZOLE SODIUM 40 MG PO TBEC
40.0000 mg | DELAYED_RELEASE_TABLET | Freq: Every day | ORAL | Status: DC
  Administered 2022-11-20 – 2022-11-24 (×5): 40 mg via ORAL
  Filled 2022-11-20 (×5): qty 1

## 2022-11-20 MED ORDER — ROSUVASTATIN CALCIUM 5 MG PO TABS
40.0000 mg | ORAL_TABLET | Freq: Every day | ORAL | Status: DC
  Administered 2022-11-20 – 2022-11-24 (×5): 40 mg via ORAL
  Filled 2022-11-20 (×4): qty 8
  Filled 2022-11-20: qty 2

## 2022-11-20 MED ORDER — NICOTINE 21 MG/24HR TD PT24
21.0000 mg | MEDICATED_PATCH | Freq: Every day | TRANSDERMAL | Status: DC
  Administered 2022-11-20: 21 mg via TRANSDERMAL
  Filled 2022-11-20 (×3): qty 1

## 2022-11-20 MED ORDER — ACETAMINOPHEN 325 MG PO TABS: 650.0000 mg | ORAL_TABLET | ORAL | Status: DC | PRN

## 2022-11-20 MED ORDER — METFORMIN HCL 500 MG PO TABS
500.0000 mg | ORAL_TABLET | Freq: Two times a day (BID) | ORAL | Status: DC
  Administered 2022-11-21 – 2022-11-24 (×7): 500 mg via ORAL
  Filled 2022-11-20 (×7): qty 1

## 2022-11-20 MED ORDER — IBUPROFEN 400 MG PO TABS: 600.0000 mg | ORAL_TABLET | Freq: Three times a day (TID) | ORAL | Status: DC | PRN

## 2022-11-20 MED ORDER — POTASSIUM CHLORIDE CRYS ER 20 MEQ PO TBCR
20.0000 meq | EXTENDED_RELEASE_TABLET | Freq: Every day | ORAL | Status: AC
  Administered 2022-11-21 – 2022-11-23 (×3): 20 meq via ORAL
  Filled 2022-11-20 (×3): qty 1

## 2022-11-20 MED ORDER — FERROUS SULFATE 325 (65 FE) MG PO TABS
325.0000 mg | ORAL_TABLET | ORAL | Status: DC
  Administered 2022-11-20 – 2022-11-24 (×3): 325 mg via ORAL
  Filled 2022-11-20 (×3): qty 1

## 2022-11-20 MED ORDER — GABAPENTIN 300 MG PO CAPS
300.0000 mg | ORAL_CAPSULE | Freq: Every day | ORAL | Status: DC
  Administered 2022-11-20 – 2022-11-23 (×4): 300 mg via ORAL
  Filled 2022-11-20 (×4): qty 1

## 2022-11-20 MED ORDER — POTASSIUM CHLORIDE CRYS ER 20 MEQ PO TBCR
40.0000 meq | EXTENDED_RELEASE_TABLET | Freq: Once | ORAL | Status: AC
  Administered 2022-11-20: 40 meq via ORAL
  Filled 2022-11-20: qty 2

## 2022-11-20 MED ORDER — AMLODIPINE BESYLATE 5 MG PO TABS
10.0000 mg | ORAL_TABLET | Freq: Every day | ORAL | Status: DC
  Administered 2022-11-20 – 2022-11-24 (×5): 10 mg via ORAL
  Filled 2022-11-20 (×5): qty 2

## 2022-11-20 NOTE — ED Triage Notes (Signed)
Pt bib ems from home with reports of generalized weakness and pain since yesterday afternoon. Endorses multiple falls yesterday evening. Hx CVA, with L sided deficits and slurred speech. Pt does endorse these symptoms feel worse than her usual since yesterday. Non compliant with medications.  160/78 HR 56 98% RA CBG 234 18g L wrist

## 2022-11-20 NOTE — ED Provider Notes (Signed)
Patient's workup is overall unremarkable.  Has chronic anemia.  Does have some hypokalemia which will be repleted.  Troponins are negative.  CT unremarkable.  No focal weakness besides her chronic left-sided weakness.  Prior to discharge, patient is reporting her poor living situation which includes no lights in her house and she feels like her life is very poor right now.  She tells me that she has been suicidal for a while and feels like she is going to kill herself if she leaves tonight.  States she has had a previous SI in the past with attempts though is not sure when.  She does have a history of polysubstance abuse and bipolar disorder.  Will consult TTS.   Pricilla Loveless, MD 11/20/22 [phone]

## 2022-11-20 NOTE — ED Notes (Signed)
Patient turned, cleaned, and changed. 
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
## 2022-11-20 NOTE — ED Notes (Signed)
Patient wailing in pain when BP cuff is inflating. Unable to obtain BP at this time.

## 2022-11-20 NOTE — ED Provider Notes (Signed)
[place] EMERGENCY DEPARTMENT AT [facility] Provider Note   CSN: [id number] Arrival date & time: 11/20/22  1142     History  Chief Complaint  Patient presents with   Weakness    Kaitlyn Gray is a 47 y.o. female.  Patient is a 47 year old female with a past medical history of CVA with left-sided deficits, diabetes, hypertension, substance use presenting to the emergency department with generalized weakness.  Patient states that she started to develop nausea, vomiting and diarrhea last night.  She states that when she woke up this morning she had some chest pain and felt generally weak all over.  She denies any associated abdominal pain.  She denies any fevers or chills.  She denies any dysuria or hematuria.  The history is provided by the patient.  Weakness      Home Medications Prior to Admission medications   Medication Sig Start Date End Date Taking? Authorizing Provider  amLODipine (NORVASC) 10 MG tablet Take 1 tablet (10 mg total) by mouth daily. 06/04/22 11/14/22  [place], Eric J, DO  ferrous sulfate 325 (65 FE) MG tablet Take 1 tablet (325 mg total) by mouth every other day. 06/04/22 09/02/22  [place], Alvira Philips, DO  gabapentin (NEURONTIN) 300 MG capsule Take 1 capsule (300 mg total) by mouth at bedtime. 06/04/22 11/14/22  [place], Alvira Philips, DO  losartan (COZAAR) 100 MG tablet Take 1 tablet (100 mg total) by mouth daily. 06/04/22 11/14/22  [place], Alvira Philips, DO  metFORMIN (GLUCOPHAGE) 500 MG tablet Take 1 tablet (500 mg total) by mouth 2 (two) times daily with a meal. 06/04/22 11/14/22  [place], Eric J, DO  pantoprazole (PROTONIX) 40 MG tablet Take 1 tablet (40 mg total) by mouth daily. 06/04/22 11/14/22  [place], Alvira Philips, DO  PARoxetine (PAXIL) 20 MG tablet Take 1 tablet (20 mg total) by mouth daily. 10/10/22   Georganna Skeans, MD  QUEtiapine (SEROQUEL) 200 MG tablet Take 1 tablet (200 mg total) by mouth at bedtime. 06/04/22 11/14/22  [place], Alvira Philips, DO  rosuvastatin  (CRESTOR) 40 MG tablet Take 1 tablet (40 mg total) by mouth daily. 06/04/22 11/14/22  [place], Eric J, DO      Allergies    Hydrocodone and Latex    Review of Systems   Review of Systems  Neurological:  Positive for weakness.    Physical Exam Updated Vital Signs BP (!) 163/91   Pulse 61   Temp 98.1 F (36.7 C) (Oral)   Resp 18   SpO2 98%  Physical Exam Vitals and nursing note reviewed.  Constitutional:      Comments: Drowsy but easily arousable to verbal stimulation, no acute distress  HENT:     Head: Normocephalic and atraumatic.     Nose: Nose normal.     Mouth/Throat:     Mouth: Mucous membranes are moist.     Pharynx: Oropharynx is clear.  Eyes:     Extraocular Movements: Extraocular movements intact.     Conjunctiva/sclera: Conjunctivae normal.     Pupils: Pupils are equal, round, and reactive to light.  Cardiovascular:     Rate and Rhythm: Normal rate and regular rhythm.     Heart sounds: Normal heart sounds.  Pulmonary:     Effort: Pulmonary effort is normal.     Breath sounds: Normal breath sounds.  Abdominal:     General: Abdomen is flat.     Palpations: Abdomen is soft.     Tenderness: There is no abdominal tenderness.  Musculoskeletal:        General: Normal range of motion.     Cervical back: Normal range of motion.  Skin:    General: Skin is warm and dry.  Neurological:     Mental Status: She is oriented to person, place, and time. Mental status is at baseline.     Sensory: No sensory deficit.     Comments: 3 out of 5 strength in left arm and left leg at baseline per patient 5 out of 5 strength in right upper and lower extremity  Psychiatric:        Mood and Affect: Mood normal.     ED Results / Procedures / Treatments   Labs (all labs ordered are listed, but only abnormal results are displayed) Labs Reviewed  BASIC METABOLIC PANEL - Abnormal; Notable for the following components:      Result Value   Potassium 3.0 (*)    Glucose, Bld 103  (*)    All other components within normal limits  CBC - Abnormal; Notable for the following components:   Hemoglobin 8.5 (*)    HCT 29.5 (*)    MCV 74.1 (*)    MCH 21.4 (*)    MCHC 28.8 (*)    RDW 18.3 (*)    All other components within normal limits  URINALYSIS, ROUTINE W REFLEX MICROSCOPIC  HEPATIC FUNCTION PANEL  MAGNESIUM  CBG MONITORING, ED  I-STAT BETA HCG BLOOD, ED (MC, WL, AP ONLY)  TROPONIN I (HIGH SENSITIVITY)  TROPONIN I (HIGH SENSITIVITY)    EKG EKG Interpretation  Date/Time:  Thursday November 20 2022 12:25:59 EDT Ventricular Rate:  58 PR Interval:  92 QRS Duration: 102 QT Interval:  490 QTC Calculation: 482 R Axis:   10 Text Interpretation: Ectopic atrial rhythm Short PR interval Probable left ventricular hypertrophy Borderline T abnormalities, anterior leads No significant change since last tracing Confirmed by Elayne Snare ([id number]) on 11/20/2022 12:33:13 PM  Radiology CT HEAD WO CONTRAST  Result Date: 11/20/2022 CLINICAL DATA:  Stroke, follow-up.  Worsening deficits. EXAM: CT HEAD WITHOUT CONTRAST TECHNIQUE: Contiguous axial images were obtained from the base of the skull through the vertex without intravenous contrast. RADIATION DOSE REDUCTION: This exam was performed according to the departmental dose-optimization program which includes automated exposure control, adjustment of the mA and/or kV according to patient size and/or use of iterative reconstruction technique. COMPARISON:  Head CT 10/23/2022.  MRI brain 10/23/2022. FINDINGS: Brain: No acute hemorrhage. 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 Age advanced volume loss. No hydrocephalus or extra-axial collection. No mass effect or midline shift. Vascular: No hyperdense vessel or unexpected calcification. Skull: No calvarial fracture or suspicious bone lesion. Skull base is unremarkable. Sinuses/Orbits: Old defect in the right  lamina papyracea, likely sequela of prior trauma. Other: None. IMPRESSION: 1. No acute intracranial abnormality. 2. 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 3. Age advanced volume loss. Electronically Signed   By: Orvan Falconer M.D.   On: 11/20/2022 14:21   DG Chest Portable 1 View  Result Date: 11/20/2022 CLINICAL DATA:  Chest pain. EXAM: PORTABLE CHEST 1 VIEW COMPARISON:  08/04/2022. FINDINGS: Low lung volumes accentuate the pulmonary vasculature and cardiomediastinal silhouette. No consolidation or pulmonary edema. No pleural effusion or pneumothorax. IMPRESSION: No evidence of acute cardiopulmonary disease. Electronically Signed   By: Orvan Falconer M.D.   On: 11/20/2022 14:18  Procedures Procedures    Medications Ordered in ED Medications  potassium chloride SA (KLOR-CON M) CR tablet 40 mEq (40 mEq Oral Not Given 11/20/22 1413)  potassium chloride 10 mEq in 100 mL IVPB (has no administration in time range)  lactated ringers bolus 1,000 mL (1,000 mLs Intravenous New Bag/Given 11/20/22 1304)  ondansetron (ZOFRAN) injection 4 mg (4 mg Intravenous Given 11/20/22 1304)    ED Course/ Medical Decision Making/ A&P Clinical Course as of 11/20/22 1530  Thu Nov 20, 2022  1301 Mild hypokalemia on labs, she will be repleted. Anemia at baseline. [VK]  1530 Patient signed out to Dr. Criss Alvine pending remainder of labs and reassessment. [VK]    Clinical Course User Index [VK] Rexford Maus, DO                             Medical Decision Making This patient presents to the ED with chief complaint(s) of N/V, weakness with pertinent past medical history of CVA with L-sided deficits, HTN, DM, substance use which further complicates the presenting complaint. The complaint involves an extensive differential diagnosis and also carries with it a high risk of complications and morbidity.    The differential diagnosis  includes patient has no new neurologic deficits making acute CVA less likely, considering dehydration, electrolyte abnormality, ACS, arrhythmia, anemia, pneumonia, pneumothorax, pulmonary edema, pleural effusion, gastroenteritis, viral syndrome, intoxication  Additional history obtained: Additional history obtained from N/A Records reviewed previous admission documents  ED Course and Reassessment: Patient was hemodynamically stable on arrival though drowsy but arousable and appears to be at her neurologic baseline.  The patient will have EKG, labs including troponin and chest x-ray as well as head CT to evaluate for etiologies of her weakness and nausea.  She will be given Zofran and fluids and will be closely reassessed.  Independent labs interpretation:  The following labs were independently interpreted: mild hypokalemia, troponin, mag and LFT's pending  Independent visualization of imaging: - I independently visualized the following imaging with scope of interpretation limited to determining acute life threatening conditions related to emergency care: CXR, CTH, which revealed no acute abnormality  Consultation: - Consulted or discussed management/test interpretation w/ external professional: N/A    Amount and/or Complexity of Data Reviewed Labs: ordered. Radiology: ordered.  Risk Prescription drug management.          Final Clinical Impression(s) / ED Diagnoses Final diagnoses:  None    Rx / DC Orders ED Discharge Orders     None         Rexford Maus, DO 11/20/22 1530

## 2022-11-20 NOTE — ED Notes (Signed)
Attempt blood draw with no success pt only wanted me to stick her one time RN notified.

## 2022-11-20 NOTE — BH Assessment (Signed)
@  2003, requested patient's nurse Marcello Moores, RN) to place the TTS machine in patient's room for his initial TTS assessment.

## 2022-11-21 LAB — RAPID URINE DRUG SCREEN, HOSP PERFORMED
Amphetamines: NOT DETECTED
Barbiturates: NOT DETECTED
Benzodiazepines: NOT DETECTED
Cocaine: POSITIVE — AB
Opiates: NOT DETECTED
Tetrahydrocannabinol: POSITIVE — AB

## 2022-11-21 MED ORDER — HALOPERIDOL LACTATE 5 MG/ML IJ SOLN
5.0000 mg | Freq: Once | INTRAMUSCULAR | Status: AC
  Administered 2022-11-21: 5 mg via INTRAMUSCULAR
  Filled 2022-11-21: qty 1

## 2022-11-21 MED ORDER — HYDROXYZINE HCL 25 MG PO TABS
25.0000 mg | ORAL_TABLET | Freq: Three times a day (TID) | ORAL | Status: DC | PRN
  Administered 2022-11-21 – 2022-11-23 (×2): 25 mg via ORAL
  Filled 2022-11-21 (×2): qty 1

## 2022-11-21 NOTE — ED Notes (Signed)
Ivc case number   [id number]

## 2022-11-21 NOTE — ED Notes (Signed)
Pt requesting something to help calm her down. Sitting outside of room in chair, refusing to go into her room, stating, "I can't go in there! There are maggots and bugs all over the floor. I can't stop hearing the whispers." Pt denies AVH, but appears to be responding to internal stimuli. MD notified. See new orders.

## 2022-11-21 NOTE — Progress Notes (Signed)
LCSW Progress Note  [id number]   Kaitlyn Gray  11/21/2022  10:09 AM  Description:   Inpatient Psychiatric Referral  Patient was recommended inpatient per [name], NP. There are [facility] available beds at [facility], per [facility], RN. Patient was referred to the following out of network facilities:   [facility] Provider Address Phone Fax  [facility]  [address] [place] [address] [phone]  [facility]  [address] [place] [address] [phone]  [facility] [address] [place]  [address] [place], [place] [address] [phone]  [facility]  [address], [place] [address] [phone]  [facility]  [address] [place] [address] [phone]  [facility]  [address] [place], [place] [address] [phone]  [facility] [place] [address] [phone]  [facility]  [address], [place] [address] [phone]  [facility]  [address] [place] [address] [phone]  [facility]  [address] [place] [address] [phone]  [facility]  [address] [place], [place] [address] [phone]  [facility]  [address], [place] [address] [phone]  [facility]  [address] [place] [address] [phone]  [facility]  [address] [place] [address] [phone]  [facility]  [address], [place] [address] [phone]  [facility]   [address] [place] [address] [phone]  [facility]  [address], [place] [address] [phone]  [facility]  [address] [place] [address] [phone]  [facility]  [address] [place], [place] [address] [phone]  [facility]  [address], [place] [address] [phone]  [facility]  [address] [place] [address] [phone]  [facility]  [address] [place] [address] [phone]  [facility]  [address] [place] [address] [phone]  [facility]  [address] [place] [address] [phone]  [facility]  [address] [place] [address] [phone]  [facility]  [address] [place] [address] [phone]  [facility]  [address] [place] [address] [phone]  [facility]  [address] [place] [address] [phone]  [facility]  [address] [place] [address] [phone]    Situation ongoing, CSW to continue following and update chart as more information becomes available.      Cathie Beams, LCSW  11/21/2022 10:09 AM

## 2022-11-21 NOTE — ED Notes (Signed)
Pt ambulated out of room to nursing station with walker, requesting for staff to give her her things so that she can go home. MD notified, will fill out IVC paperwork. Pt redirectable, agreed to go back to room and wait for medication that the doctor ordered.

## 2022-11-21 NOTE — ED Provider Notes (Signed)
Patient having visual hallucinations, reporting bugs and maggots crawling in the room which are not present.  Patient is no longer wanting to stay in the hospital.  She does report recent cocaine use.  Unfortunately, given her concern for hallucinations, which may be drug-induced, as well as her suicidal ideation, I have failed an IVC.  She was recommended for inpatient treatment by psychiatric services.  At this point I do not think she can reasonably contract for safety.  If her mental status improves in the future, and the hallucinations were in fact drug-related, she could perhaps be contracted for safety at a later time.   Terald Sleeper, MD 11/21/22 [phone]

## 2022-11-21 NOTE — ED Notes (Signed)
Pt endorses cocaine and THC use on a regular basis. Reports last use 2 days ago. MD notified. Will collect UDS when pt is able to void.

## 2022-11-21 NOTE — ED Notes (Signed)
Pt's clothes saturated with urine. Called aunt, [name], with patient's approval to see if she could pick up patient's clothing to wash it. Per aunt, unable to at this time but may be able to later today after her husband gets off work. Will lock patient's belongings up in locker for the time being.

## 2022-11-21 NOTE — ED Provider Notes (Signed)
Emergency Medicine Observation Re-evaluation Note  Kaitlyn Gray is a 47 y.o. female, seen on rounds today.  Pt initially presented to the ED for complaints of Weakness Currently, the patient is sleeping.  Physical Exam  BP (!) 155/83 (BP Location: Right Arm)   Pulse 62   Temp 97.8 F (36.6 C) (Oral)   Resp 19   SpO2 100%  Physical Exam General: sleeping Cardiac: regular Lungs: clear Psych: calm and cooperative  ED Course / MDM  EKG:EKG Interpretation  Date/Time:  Thursday November 20 2022 12:25:59 EDT Ventricular Rate:  58 PR Interval:  92 QRS Duration: 102 QT Interval:  490 QTC Calculation: 482 R Axis:   10 Text Interpretation: Ectopic atrial rhythm Short PR interval Probable left ventricular hypertrophy Borderline T abnormalities, anterior leads No significant change since last tracing Confirmed by Elayne Snare ([id number]) on 11/20/2022 12:33:13 PM  I have reviewed the labs performed to date as well as medications administered while in observation.  Recent changes in the last 24 hours include none.  Plan  Current plan is for needs inpt treatment and looking for placement.    Gwyneth Sprout, MD 11/21/22 [phone]

## 2022-11-21 NOTE — Progress Notes (Signed)
LCSW Progress Note  [id number]   Kaitlyn Gray  11/21/2022  12:16 AM    Inpatient Behavioral Health Placement  Pt meets inpatient criteria per Sindy Guadeloupe, NP. There are no available beds [facility] per [facility] Fransico Michael, RN. Referral was sent to the following facilities;   Destination  Service Provider Address Phone Fax  [facility]  [address] [place] [address] [phone]  [facility]  [address] [place] [address] [phone]  [facility] [address] [place]  [address] [place], [place] [address] [phone]  [facility]  [address], [place] [address] [phone]  [facility]  [address] [place] [address] [phone]  [facility]  [address] [place], [place] [address] [phone]  [facility] [place] [address] [phone]  [facility]  [address] [place] [address] [phone]  [facility]  [address] [place] [address] [phone]  [facility]  [address] [place] [address] [phone]  [facility]  [address] [place], [place] [address] [phone]  [facility]  [address], [place] [address] [phone]  [facility]  [address] [place] [address] [phone]  [facility]  [address] [place] [address] [phone]  [facility]  [address], [place] [address] [phone]  [facility]  [address] [place] [address] [phone]  [facility]  [address], [place] [address] [phone]  [facility]  [address] [place] [address] [phone]  [facility]  [address], [place] [address] [phone]  [facility]  [address], [place] [address] [phone]  [facility]  [address] [place] [address] [phone]  [facility]  [address] [place] [address] [phone]  [facility]  [address], [place] [address] [phone]  [facility]  [address] [place] [address] [phone]  [facility]  [address] [place] [address] [phone]  [facility]  [address] [place] [address] [phone]  [facility]  [address] [place] [address] [phone]  [facility]  [address] [place] [address] [phone]  [facility]  [address], [place] [address] [phone]    Situation ongoing,  CSW will follow up.    Maryjean Ka, MSW, LCSWA 11/21/2022 12:16 AM

## 2022-11-22 DIAGNOSIS — F315 Bipolar disorder, current episode depressed, severe, with psychotic features: Secondary | ICD-10-CM

## 2022-11-22 MED ORDER — LORAZEPAM 1 MG PO TABS
1.0000 mg | ORAL_TABLET | Freq: Three times a day (TID) | ORAL | Status: DC | PRN
  Administered 2022-11-23: 1 mg via ORAL
  Filled 2022-11-22: qty 1

## 2022-11-22 MED ORDER — HALOPERIDOL 5 MG PO TABS
5.0000 mg | ORAL_TABLET | Freq: Three times a day (TID) | ORAL | Status: DC | PRN
  Administered 2022-11-22 – 2022-11-23 (×2): 5 mg via ORAL
  Filled 2022-11-22 (×2): qty 1

## 2022-11-22 MED ORDER — HALOPERIDOL LACTATE 5 MG/ML IJ SOLN: 5.0000 mg | Freq: Three times a day (TID) | INTRAMUSCULAR | Status: DC | PRN

## 2022-11-22 MED ORDER — ARIPIPRAZOLE 10 MG PO TABS
10.0000 mg | ORAL_TABLET | Freq: Every day | ORAL | Status: DC
  Administered 2022-11-22 – 2022-11-24 (×3): 10 mg via ORAL
  Filled 2022-11-22 (×3): qty 1

## 2022-11-22 MED ORDER — LORAZEPAM 2 MG/ML IJ SOLN
1.0000 mg | Freq: Three times a day (TID) | INTRAMUSCULAR | Status: DC | PRN
  Administered 2022-11-22: 1 mg via INTRAMUSCULAR
  Filled 2022-11-22: qty 1

## 2022-11-22 NOTE — ED Notes (Signed)
Pt ambulatory w/ walker to bathroom independently

## 2022-11-22 NOTE — Progress Notes (Signed)
[facility] Progress Note  11/22/2022 10:51 AM Kaitlyn Gray  MRN:  [id number]   Subjective:   Patient seen this morning at [facility] for face to face psychiatric evaluation. Pt is irritable, but willing to engage in assessment. She continues to say she is suicidal. However, denies any plan or intent today. She denies HI. Denies auditory or visual hallucinations today. Per chart review, she was reporting AVH yesterday of voices telling her to harm herself and seeing bugs in her room. She states this has happened to her before usually after cocaine use, but she is denying hallucinations today. She continues to mention how depressed she is. Mentions stressors of living alone, no family support, and poverty. Her main goals of this hospitalization is to address her substance abuse and adjust medications.   Pt reports she previous used Abilify and Seroquel, and felt this combination worked well for her in the past. She was taking Abilify Maintenna, and is hoping to start this again. Agreeable with restarting Abilify at this time to additionally help with hallucinations and depression. Will start at Abilify 10 mg. Will continue to recommend inpatient psychiatric treatment at this time.   Principal Problem: Bipolar disorder (HCC) Diagnosis:  Principal Problem:   Bipolar disorder (HCC) Active Problems:   Polysubstance abuse (HCC)   Noncompliance with medications   ED Assessment Time Calculation: Start Time: 0900 Stop Time: 0930 Total Time in Minutes (Assessment Completion): 30   [place] Scale:  Flowsheet Row ED from 11/20/2022 in [facility] Department at [facility] ED from 10/23/2022 [facility] ED from 09[facility]10/2022 in [facility] Emergency Department at [facility]  C-SSRS RISK CATEGORY No Risk No Risk No Risk       Past Medical History:  Past Medical History:  Diagnosis Date   Adjustment disorder with disturbance of  conduct 02/12/2020   Bipolar 1 disorder (HCC)    Cannabis use disorder, moderate, dependence (HCC) 03/24/2015   Chronic anemia    Cocaine use disorder, severe, dependence (HCC) 03/24/2015   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    Encounter for assessment of healthcare decision-making capacity    History of cervical fracture 12/24/2017   nondisplaced fracture lateral mass of C1 on the right on CT 12/24/17   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) 07/19/2017   Hypertension    Hypertensive emergency 05/31/2022   Ischemic stroke (HCC) 01/01/2020   subacute right middle cerebellar peduncle and pons infarction   Left-sided weakness 01/27/2022   Head CT with remote right occipital infarct which is consistent with old left-sided weakness   MDD (major depressive disorder), recurrent severe, 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   Normocytic anemia 02/09/2020   Opiate use    History of Suboxone Therapy until 05/2021   Polysubstance abuse (HCC) 07/19/2017   Prescription drug misadventures (Seroquel)     Past Surgical History:  Procedure Laterality Date   CESAREAN SECTION     Family History:  Family History  Problem Relation Age of Onset   Hypertension Mother    CAD Mother 92       died of MI at age 62   Hypertension Father    Social History:  Social History   Substance and Sexual Activity  Alcohol Use No     Social History   Substance and Sexual Activity  Drug Use Yes   Types: Marijuana, Cocaine    Social History   Socioeconomic  History   Marital status: Single    Spouse name: Not on file   Number of children: Not on file   Years of education: Not on file   Highest education level: Not on file  Occupational History   Not on file  Tobacco Use   Smoking status: Every Day    Packs/day: .5    Types: Cigarettes   Smokeless tobacco: Never  Vaping Use   Vaping Use: Never used  Substance and Sexual Activity   Alcohol use: No   Drug use: Yes     Types: Marijuana, Cocaine   Sexual activity: Not on file  Other Topics Concern   Not on file  Social History Narrative   Not on file   Social Determinants of Health   Financial Resource Strain: Not on file  Food Insecurity: Food Insecurity Present (08/04/2022)   Hunger Vital Sign    Worried About Running Out of Food in the Last Year: Sometimes true    Ran Out of Food in the Last Year: Sometimes true  Transportation Needs: Unmet Transportation Needs (08/04/2022)   PRAPARE - Administrator, Civil Service (Medical): Yes    Lack of Transportation (Non-Medical): Yes  Physical Activity: Not on file  Stress: Not on file  Social Connections: Not on file    Sleep: Good  Appetite:  Good  Current Medications: Current Facility-Administered Medications  Medication Dose Route Frequency Provider Last Rate Last Admin   acetaminophen (TYLENOL) tablet 650 mg  650 mg Oral Q4H PRN Pricilla Loveless, MD       amLODipine (NORVASC) tablet 10 mg  10 mg Oral Daily Pricilla Loveless, MD   10 mg at 11/22/22 0981   ferrous sulfate tablet 325 mg  325 mg Oral Magda Kiel, MD   325 mg at 11/22/22 0917   gabapentin (NEURONTIN) capsule 300 mg  300 mg Oral QHS Pricilla Loveless, MD   300 mg at 11/21/22 2152   hydrOXYzine (ATARAX) tablet 25 mg  25 mg Oral TID PRN Terald Sleeper, MD   25 mg at 11/21/22 1759   losartan (COZAAR) tablet 100 mg  100 mg Oral Daily Pricilla Loveless, MD   100 mg at 11/22/22 0917   metFORMIN (GLUCOPHAGE) tablet 500 mg  500 mg Oral BID WC Pricilla Loveless, MD   500 mg at 11/22/22 1914   nicotine (NICODERM CQ - dosed in mg/24 hours) patch 21 mg  21 mg Transdermal Daily Pricilla Loveless, MD   21 mg at 11/20/22 1821   pantoprazole (PROTONIX) EC tablet 40 mg  40 mg Oral Daily Pricilla Loveless, MD   40 mg at 11/22/22 0917   PARoxetine (PAXIL) tablet 20 mg  20 mg Oral Daily Pricilla Loveless, MD   20 mg at 11/22/22 0916   potassium chloride SA (KLOR-CON M) CR tablet 20 mEq  20 mEq  Oral Daily Pricilla Loveless, MD   20 mEq at 11/22/22 0917   QUEtiapine (SEROQUEL) tablet 200 mg  200 mg Oral QHS Pricilla Loveless, MD   200 mg at 11/21/22 2152   rosuvastatin (CRESTOR) tablet 40 mg  40 mg Oral Daily Pricilla Loveless, MD   40 mg at 11/22/22 7829   Current Outpatient Medications  Medication Sig Dispense Refill   amLODipine (NORVASC) 10 MG tablet Take 1 tablet (10 mg total) by mouth daily. 30 tablet 2   aspirin EC 81 MG tablet Take 81 mg by mouth daily. Swallow whole.     ferrous sulfate 325 (  65 FE) MG tablet Take 1 tablet (325 mg total) by mouth every other day. 15 tablet 2   gabapentin (NEURONTIN) 300 MG capsule Take 1 capsule (300 mg total) by mouth at bedtime. 30 capsule 2   losartan (COZAAR) 100 MG tablet Take 1 tablet (100 mg total) by mouth daily. 30 tablet 2   metFORMIN (GLUCOPHAGE) 500 MG tablet Take 1 tablet (500 mg total) by mouth 2 (two) times daily with a meal. 60 tablet 2   pantoprazole (PROTONIX) 40 MG tablet Take 1 tablet (40 mg total) by mouth daily. 30 tablet 2   PARoxetine (PAXIL) 20 MG tablet Take 1 tablet (20 mg total) by mouth daily. 90 tablet 0   QUEtiapine (SEROQUEL) 200 MG tablet Take 1 tablet (200 mg total) by mouth at bedtime. 30 tablet 2   rosuvastatin (CRESTOR) 40 MG tablet Take 1 tablet (40 mg total) by mouth daily. 30 tablet 2    Lab Results:  Results for orders placed or performed during the hospital encounter of 11/20/22 (from the past 48 hour(s))  Basic metabolic panel     Status: Abnormal   Collection Time: 11/20/22 11:58 AM  Result Value Ref Range   Sodium 144 135 - 145 mmol/L   Potassium 3.0 (L) 3.5 - 5.1 mmol/L   Chloride 108 98 - 111 mmol/L   CO2 26 22 - 32 mmol/L   Glucose, Bld 103 (H) 70 - 99 mg/dL    Comment: Glucose reference range applies only to samples taken after fasting for at least 8 hours.   BUN 16 6 - 20 mg/dL   Creatinine, Ser 1.32 0.44 - 1.00 mg/dL   Calcium 9.0 8.9 - 44.0 mg/dL   GFR, Estimated >10 >27 mL/min     Comment: (NOTE) Calculated using the CKD-EPI Creatinine Equation (2021)    Anion gap 10 5 - 15    Comment: Performed at [facility], [address] [place], [place] [address]  CBC     Status: Abnormal   Collection Time: 11/20/22 11:58 AM  Result Value Ref Range   WBC 4.7 4.0 - 10.5 K/uL   RBC 3.98 3.87 - 5.11 MIL/uL   Hemoglobin 8.5 (L) 12.0 - 15.0 g/dL    Comment: Reticulocyte Hemoglobin testing may be clinically indicated, consider ordering this additional test [id number]    HCT 29.5 (L) 36.0 - 46.0 %   MCV 74.1 (L) 80.0 - 100.0 fL   MCH 21.4 (L) 26.0 - 34.0 pg   MCHC 28.8 (L) 30.0 - 36.0 g/dL   RDW 59.5 (H) 63.8 - 75.6 %   Platelets 302 150 - 400 K/uL   nRBC 0.0 0.0 - 0.2 %    Comment: Performed at [facility], [address], [place], [place] [address]  I-Stat beta hCG blood, ED     Status: None   Collection Time: 11/20/22 12:00 PM  Result Value Ref Range   I-stat hCG, quantitative <5.0 <5 mIU/mL   Comment 3            Comment:   GEST. AGE      CONC.  (mIU/mL)   <=1 WEEK        5 - 50     2 WEEKS       50 - 500     3 WEEKS       100 - 10,000     4 WEEKS     1,000 - 30,000  FEMALE AND NON-PREGNANT FEMALE:     LESS THAN 5 mIU/mL   Hepatic function panel     Status: Abnormal   Collection Time: 11/20/22 12:47 PM  Result Value Ref Range   Total Protein 6.9 6.5 - 8.1 g/dL   Albumin 3.2 (L) 3.5 - 5.0 g/dL   AST 19 15 - 41 U/L   ALT 18 0 - 44 U/L   Alkaline Phosphatase 60 38 - 126 U/L   Total Bilirubin 0.1 (L) 0.3 - 1.2 mg/dL   Bilirubin, Direct <4.0 0.0 - 0.2 mg/dL   Indirect Bilirubin NOT CALCULATED 0.3 - 0.9 mg/dL    Comment: Performed at [facility], [address], [place], [place] [address]  Troponin I (High Sensitivity)     Status: None   Collection Time: 11/20/22 12:47 PM  Result Value Ref Range   Troponin I (High Sensitivity) 6 <18 ng/L    Comment: (NOTE) Elevated high sensitivity troponin I (hsTnI) values and significant  changes  across serial measurements may suggest ACS but many other  chronic and acute conditions are known to elevate hsTnI results.  Refer to the "Links" section for chest pain algorithms and additional  guidance. Performed at [facility], [address] [place], [place] [address]   Magnesium     Status: None   Collection Time: 11/20/22 12:47 PM  Result Value Ref Range   Magnesium 2.0 1.7 - 2.4 mg/dL    Comment: Performed at [facility], [address], [place], [place] [address]  Urinalysis, Routine w reflex microscopic -Urine, Clean Catch     Status: Abnormal   Collection Time: 11/20/22  3:46 PM  Result Value Ref Range   Color, Urine YELLOW YELLOW   APPearance CLOUDY (A) CLEAR   Specific Gravity, Urine 1.020 1.005 - 1.030   pH 6.0 5.0 - 8.0   Glucose, UA NEGATIVE NEGATIVE mg/dL   Hgb urine dipstick NEGATIVE NEGATIVE   Bilirubin Urine NEGATIVE NEGATIVE   Ketones, ur NEGATIVE NEGATIVE mg/dL   Protein, ur NEGATIVE NEGATIVE mg/dL   Nitrite NEGATIVE NEGATIVE   Leukocytes,Ua NEGATIVE NEGATIVE    Comment: Performed at [facility], [address], [place], [place] [address]  Troponin I (High Sensitivity)     Status: None   Collection Time: 11/20/22  4:25 PM  Result Value Ref Range   Troponin I (High Sensitivity) 7 <18 ng/L    Comment: (NOTE) Elevated high sensitivity troponin I (hsTnI) values and significant  changes across serial measurements may suggest ACS but many other  chronic and acute conditions are known to elevate hsTnI results.  Refer to the "Links" section for chest pain algorithms and additional  guidance. Performed at [facility], [address] [place], [place] [address]   Rapid urine drug screen (hospital performed)     Status: Abnormal   Collection Time: 11/21/22  5:49 PM  Result Value Ref Range   Opiates NONE DETECTED NONE DETECTED   Cocaine POSITIVE (A) NONE DETECTED   Benzodiazepines NONE DETECTED NONE DETECTED   Amphetamines NONE  DETECTED NONE DETECTED   Tetrahydrocannabinol POSITIVE (A) NONE DETECTED   Barbiturates NONE DETECTED NONE DETECTED    Comment: (NOTE) DRUG SCREEN FOR MEDICAL PURPOSES ONLY.  IF CONFIRMATION IS NEEDED FOR ANY PURPOSE, NOTIFY LAB WITHIN 5 DAYS.  LOWEST DETECTABLE LIMITS FOR URINE DRUG SCREEN Drug Class  Cutoff (ng/mL) Amphetamine and metabolites    1000 Barbiturate and metabolites    200 Benzodiazepine                 200 Opiates and metabolites        300 Cocaine and metabolites        300 THC                            50 Performed at [facility], [address], [place], [place] [address]     Blood Alcohol level:  Lab Results  Component Value Date   [facility] <10 10/23/2022   ETH <10 09/10/2022    Psychiatric Specialty Exam:  Presentation  General Appearance:  Fairly Groomed  Eye Contact: Fair  Speech: Clear and Coherent  Speech Volume: Normal  Handedness:No data recorded  Mood and Affect  Mood: Irritable  Affect: Congruent   Thought Process  Thought Processes: Coherent  Descriptions of Associations:Intact  Orientation:Full (Time, Place and Person)  Thought Content:Logical  History of Schizophrenia/Schizoaffective disorder:No  Duration of Psychotic Symptoms:Greater than six months  Hallucinations:Hallucinations: None  Ideas of Reference:None  Suicidal Thoughts:Suicidal Thoughts: Yes, Passive SI Passive Intent and/or Plan: Without Intent; Without Plan  Homicidal Thoughts:Homicidal Thoughts: No   Sensorium  Memory: Immediate Fair; Recent Fair  Judgment: Fair  Insight: Fair   Art therapist  Concentration: Fair  Attention Span: Fair  Recall: Good  Fund of Knowledge: Good  Language: Good   Psychomotor Activity  Psychomotor Activity: Psychomotor Activity: Normal   Assets  Assets: Desire for Improvement; Physical Health; Resilience; Housing   Sleep  Sleep: Sleep:  Good    Physical Exam: Physical Exam Neurological:     Mental Status: She is alert and oriented to person, place, and time.  Psychiatric:        Attention and Perception: Attention normal.        Mood and Affect: Affect is flat.        Speech: Speech normal.        Behavior: Behavior is cooperative.        Thought Content: Thought content includes suicidal ideation.    Review of Systems  Psychiatric/Behavioral:  Positive for depression, hallucinations and substance abuse.   All other systems reviewed and are negative.  Blood pressure (!) 152/95, pulse 61, temperature 97.7 F (36.5 C), temperature source Oral, resp. rate 14, SpO2 99 %. There is no height or weight on file to calculate BMI.   Medical Decision Making: Pt case reviewed and discussed with Dr. Sherron Flemings. Will continue to recommend inpatient psychiatric treatment for further stabilization and medication management.   - Start Abilify 10 mg daily  Eligha Bridegroom, NP 11/22/2022, 10:51 AM

## 2022-11-22 NOTE — Progress Notes (Addendum)
CSW sent referral to out of network providers:  Destination  Service Provider Address Phone Fax  [facility]  [address], [place] [address] [phone]  [facility]  [address] [place] [address] [phone]  [facility]  [address] [place], [place] [address] [phone]  [facility]  [address], [place] [address] [phone]  [facility]  [address] [place] [address] [phone]  [facility]  [address] [place], [place] [address] [phone]  [facility] [place] [address] [phone]  [facility]  [address] [place] [address] [phone]  [facility]  [address] [place] [address] [phone]  [facility]  [address] [place] [address] [phone]  [facility]  [address] [place], [place] [address] [phone]  [facility]  [address] [place] [address] [phone]  [facility]  [address] [place] [address] [phone]  [facility]  [address] [place] [address] [phone]  [facility]  [address] [place] [address] [phone]  [facility]  [address] [place] [address] [phone]  [facility]  [address], [place] [address] [phone]  [facility]  [address], [place] [address] [phone]  [facility]  [address], [place] [address] [phone]  [facility]  [address], [place] [address] [phone]  [facility]  [address] [place] [address] [phone]  [facility]  [address] [place] [address] [phone]  [facility]  [address], [place] [address] [phone]  [facility]  [address] [place] [address] [phone]  [facility]  [address], [place] [address] [phone]  [facility]  [address] [place] [address] [phone]  [facility]  [address] [place] [address] [phone]  [facility]  [address], [place] [address] [phone]  [facility]  [address], [place] [address] [phone]  [facility]  [address] [place] [address] [phone]  [facility]  [address], [place] [address] [phone]  [facility]  [address], [place] [address] [phone]  [facility]  [address] [place] [address] [phone]  [facility]  [address] [place] [address] [phone]     Maryjean Ka, MSW, [facility] 11/22/2022 1:43 AM

## 2022-11-22 NOTE — ED Notes (Signed)
Pt came to RN station and states, "The people are talking to me on the wall". Pt will not go back into her room. Notified Coleman NP to see about any prn medications that can be given. Pt is calm at this time.

## 2022-11-22 NOTE — ED Provider Notes (Signed)
Emergency Medicine Observation Re-evaluation Note  Kaitlyn Gray is a 47 y.o. female, seen on rounds today.  Pt initially presented to the ED for complaints of Weakness Currently, the patient is resting.  Physical Exam  BP (!) 152/95 (BP Location: Right Arm)   Pulse 61   Temp 97.7 F (36.5 C) (Oral)   Resp 14   SpO2 99%  Physical Exam General: NAD   ED Course / MDM  EKG:EKG Interpretation  Date/Time:  Thursday November 20 2022 12:25:59 EDT Ventricular Rate:  58 PR Interval:  92 QRS Duration: 102 QT Interval:  490 QTC Calculation: 482 R Axis:   10 Text Interpretation: Ectopic atrial rhythm Short PR interval Probable left ventricular hypertrophy Borderline T abnormalities, anterior leads No significant change since last tracing Confirmed by Elayne Snare ([id number]) on 11/20/2022 12:33:13 PM  I have reviewed the labs performed to date as well as medications administered while in observation.  Recent changes in the last 24 hours include no acute events reported.  Plan  Current plan is for psych placement, on IVC.    Wynetta Fines, MD 11/22/22 1116

## 2022-11-22 NOTE — Progress Notes (Signed)
[facility] LCSW Progress Note   11/22/2022    5:28 PM  Kaitlyn Gray   [id number]   Type of Contact and Topic:  Psychiatric Bed Placement   Pt accepted to [facility]    Patient meets inpatient criteria per Eligha Bridegroom, NP  The attending provider will be Dr. Loni Beckwith  Call report to [phone]   Denton Ar, RN @ [facility] ED notified.     Pt scheduled  [facility] at [facility] AFTER 0800.    Damita Dunnings, MSW, LCSW-A  5:29 PM 11/22/2022

## 2022-11-22 NOTE — Progress Notes (Signed)
Patient has been denied by [facility] due to no appropriate beds available. Patient meets BH inpatient criteria per Eligha Bridegroom, NP. Patient has been faxed out to the following facilities:   [facility] Pending - Request SentN/A[address] Claris Gower [place] [phone]--[facility] Pending - Request [address] Lu Duffel [place] [phone]--[facility] Pending - Request SentN/A2201 [address] [place], [place] [phone]--[facility] Pending - Request SentN/A[address] [place] [phone]--[facility] Pending - Request SentN/A[address] Rolene Arbour [place] [phone]--[facility] Pending - Request SentN/A[address] [place], [place] [phone]--[facility] Pending [facility] [address] Pricilla Larsson [facility] Pending - Request SentN/A[address] Rhodia Albright [place] [phone] [facility] Pending - Request SentN/A[address] [place] [facility] [phone]--TKZSW-FUXN [facility] Pending - Request [phone] [address] [place] [address], [facility] [place] [phone]--[facility] Pending - Request [address] [place], [place] [phone]--[facility] Pending - Request SentN/A[address] [place] [phone]--GGYIR-SWNIO [facility] Pending - Request [address] [place] [phone]--YBOFB-P[facility] Pending - Request SentN/A200 Marylou Flesher [facility] [phone]--PPJKD-TOIZ [facility] Pending -  Request [address], H. Rivera [place] [phone]--FXTKW-IOX [facility] Pending - Request [phone] [name] [place] [phone]--XKGYJ-EHUD [facility] Pending - Request [facility], [place] [phone]--[facility] Pending - Request SentN/A612 [address]ju[address] Kava [facility] Pending - Request SentN/A[address] [place] [address], [place] [phone]--[facility] Pending - Request SentN/A32[address] [facility] [address], Lanae Boast [facility] Pending - Request [address] [place], [place] [phone]--XNTZG-YFVCBSWH [facility] Pending - Request [address] Hessie D[place] [phone]--ZESPQ-ZRA [facility] Pending - Request SentN/A[address] [place] [phone]--[facility] Pending - Request [facility] [address] [place] [phone]--JGOTL-XBWIOMBT [facility] Pending - Request [address], [place] [phone]--OIBBC-WUGQ [facility] Pending - Request SentN/A[address] Rande Lawman [facility] [phone]--[facility] Pending - Request SentN/A420 [address] [place] [phone]--WPVXY-IAXK [facility] Pending - Request SentN/A601 [address], [place] [phone]--RFXJO-ITGPQD Hospital Pending - Request SentN/A[address] [place] [facility] Pending - Request SentN/A[address] [place] [phone]--YVOPF-YTWKMQK Health Pending - Request [address] [place]  [phone]--[facility] Pending - Request SentN/A2100 Rachel[address] Hora [place] [phone]--TYOMA[facility] Pending - Request SentN/A113 [address] [facility], [place] [phone]--FGBMS-XJDBZ [facility] Pending - Request [address] Lacy [place] [phone]--  Damita Dunnings, MSW, LCSW-A  4:44 PM 11/22/2022

## 2022-11-22 NOTE — ED Notes (Signed)
Pt ambulated w/ rollator walker independently to bathroom.

## 2022-11-22 NOTE — ED Notes (Signed)
Patient approached nurses station and requested a shot for the voices she is hearing. RN discussed medication options with patient including PO or IM ativan, the patient requested IM ativan.

## 2022-11-22 NOTE — ED Notes (Signed)
Pt insisted on using the bedpan d/t not thinking she could make it to the bathroom.

## 2022-11-23 NOTE — ED Notes (Signed)
Updated BH team that this RN has not received a callback from [facility] regarding report. Updated them that [facility] officer has transport set up to pick up pt in the morning.

## 2022-11-23 NOTE — ED Notes (Signed)
Patient came out of the room stating there are "people on the wall and I don't want to go back in there because they are making faces at me."

## 2022-11-23 NOTE — ED Notes (Signed)
Called and left voicemail and secure pager line for report at [facility]

## 2022-11-23 NOTE — ED Notes (Addendum)
Called [facility] pager number x [phone]. Also called other numbers that CSW provided this RN w/o success. BH team updated.

## 2022-11-23 NOTE — ED Notes (Signed)
Called [facility] x 3 

## 2022-11-23 NOTE — ED Notes (Signed)
Called [facility] again

## 2022-11-23 NOTE — ED Notes (Signed)
Received call back from [facility] at [phone]. Chase to call this RN back d/t this RN was in the cafeteria at the time and was unable to give report at that time. This RN was informed by [facility] RN that she would call this RN back in about 40 mins. [facility] RN asked when pt would be arriving to facility and that it would have to be prior to 1400 d/t she is the only nurse at this time and if pt can't arrive until after 1400, pt transfer would have to be delayed.

## 2022-11-23 NOTE — ED Provider Notes (Signed)
Emergency Medicine Observation Re-evaluation Note  Kaitlyn Gray is a 47 y.o. female, seen on rounds today.  Pt initially presented to the ED for complaints of Weakness Currently, the patient is comfortable.  Physical Exam  BP (!) 150/75 (BP Location: Right Arm)   Pulse 61   Temp 98.6 F (37 C) (Oral)   Resp 16   SpO2 100%  Physical Exam General: NAD   ED Course / MDM  EKG:EKG Interpretation  Date/Time:  Thursday November 20 2022 12:25:59 EDT Ventricular Rate:  58 PR Interval:  92 QRS Duration: 102 QT Interval:  490 QTC Calculation: 482 R Axis:   10 Text Interpretation: Ectopic atrial rhythm Short PR interval Probable left ventricular hypertrophy Borderline T abnormalities, anterior leads No significant change since last tracing Confirmed by Elayne Snare ([id number]) on 11/20/2022 12:33:13 PM  I have reviewed the labs performed to date as well as medications administered while in observation.  Recent changes in the last 24 hours include no acute events reported.  Plan  Current plan [facility] for pending [facility], Accepted by Warren Lacy, MD 11/23/22 [phone]

## 2022-11-23 NOTE — ED Notes (Signed)
Called [facility] Pager Number x [phone]

## 2022-11-24 DIAGNOSIS — E11649 Type 2 diabetes mellitus with hypoglycemia without coma: Secondary | ICD-10-CM

## 2022-11-24 LAB — BASIC METABOLIC PANEL
Anion gap: 10 (ref 5–15)
BUN: 14 mg/dL (ref 6–20)
CO2: 23 mmol/L (ref 22–32)
Calcium: 9.1 mg/dL (ref 8.9–10.3)
Chloride: 104 mmol/L (ref 98–111)
Creatinine, Ser: 0.87 mg/dL (ref 0.44–1.00)
GFR, Estimated: >60 mL/min (ref >60)
Glucose, Bld: 98 mg/dL (ref 70–99)
Potassium: 4 mmol/L (ref 3.5–5.1)
Sodium: 137 mmol/L (ref 135–145)

## 2022-11-24 LAB — HEMOGLOBIN A1C
Hgb A1c MFr Bld: 6.4 % — ABNORMAL HIGH (ref 4.8–5.6)
Mean Plasma Glucose: 136.98 mg/dL

## 2022-11-24 LAB — CBG MONITORING, ED
Glucose-Capillary: 431 mg/dL — ABNORMAL HIGH (ref 70–99)
Glucose-Capillary: 185 mg/dL — ABNORMAL HIGH (ref 70–99)
Glucose-Capillary: 134 mg/dL — ABNORMAL HIGH (ref 70–99)

## 2022-11-24 MED ORDER — INSULIN ASPART 100 UNIT/ML IJ SOLN
0.0000 [IU] | Freq: Three times a day (TID) | INTRAMUSCULAR | Status: DC
  Administered 2022-11-24: 3 [IU] via SUBCUTANEOUS

## 2022-11-24 NOTE — Consult Note (Signed)
Initial Consultation Note   Patient: Kaitlyn Gray AOZ:[id number] DOB: 12/10/1975 PCP: Georganna Skeans, MD DOA: 11/20/2022 DOS: the patient was seen and examined on 11/24/2022 Primary service: Default, Provider, MD  Referring physician: Dr Elpidio Anis, ED provider Reason for consult: Elevated blood glucose  Assessment/Plan: Assessment and Plan: Diabetes mellitus type 2: Blood glucose was uncontrolled this morning with fingerstick glucose recorded to be >400.  Likely due to poor eating habits.  Patient was given her regular metformin this morning.  No insulin coverage given.  Repeat blood glucose resulted however at 185.  A1c of 6.4 suggest appreciably controlled blood glucose over the past several months. Her 1 episode of elevated blood glucose could have been an error vrs diet related.  I will continue patient on same dose.  Strict diabetic diet was advised at discharge.Finger stick glucose monitoring was advisedin the unit and at discharge.  History of Stroke with residual Left sided weakness: Has had multiple ED visits on account of pseudostrokes.  CT head on this admission without any acute stroke.  MRI from 10/23/2022 with similar symptoms were unremarkable for any acute infarct.  Seen evaluated by neurology on this admission and felt her symptoms were likely psychogenic in etiology.  History of bipolar disorder, adjustment disorder with suicidal ideations on this admission: Patient is being considered for inpatient psych placement  Thank you for your consult. TRH will sign off at present, please call us again when needed.  HPI: Kaitlyn Gray is a 47 y.o. female with past medical history of type 2 diabetes mellitus on metformin at home, bipolar disorder, cannabis/cocaine use disorder, history of old stroke with Left sided weakness and dysarthria, Hypertension who presented to the emergency room on 11/20/2022 with complaints of focal weakness.  On presentation, patient had complaints of  profound emotional distress, helplessness and a desire to self-harm.  She was deemed a candidate for inpatient psych admission and placement and was being planned for possible transfer today.  During the course of this stay, she has been compliant with her metformin, however blood glucose were not being monitored.  Blood glucose this morning incidentally came up at >400.  Repeat blood glucose however without any insulin intervention showed blood glucose of 185.  Review of her blood workup in the past revealed A1c of 6.2 in 2023.  Review of Systems: As mentioned in the history of present illness. All other systems reviewed and are negative. 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    Encounter for assessment of healthcare decision-making capacity    History of cervical fracture 12/24/2017   nondisplaced fracture lateral mass of C1 on the right on CT 12/24/17   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) 07/19/2017   Hypertension    Hypertensive emergency 05/31/2022   Ischemic stroke (HCC) 01/01/2020   subacute right middle cerebellar peduncle and pons infarction   Left-sided weakness 01/27/2022   Head CT with remote right occipital infarct which is consistent with old left-sided weakness   MDD (major depressive disorder), recurrent severe, 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 She has been smoking  an average of .5 packs per day. She has never used smokeless tobacco. She reports current drug use. Drugs: Marijuana and Cocaine. She reports that she does not drink alcohol.  Allergies  Allergen Reactions   Beef-Derived Products     Pt does not eat red meat   Hydrocodone Itching   Latex Itching and Rash    Family History  Problem Relation Age of Onset   Hypertension Mother    CAD Mother 30       died of MI at age 38   Hypertension Father     Prior to Admission medications   Medication Sig Start Date End Date Taking? Authorizing Provider  amLODipine (NORVASC) 10 MG tablet Take 1 tablet (10 mg total) by mouth daily. 06/04/22 11/20/22 Yes [place], Alvira Philips, DO  aspirin EC 81 MG tablet Take 81 mg by mouth daily. Swallow whole.   Yes [provider]  ferrous sulfate 325 (65 FE) MG tablet Take 1 tablet (325 mg total) by mouth every other day. 06/04/22 11/20/22 Yes [place], Eric J, DO  gabapentin (NEURONTIN) 300 MG capsule Take 1 capsule (300 mg total) by mouth at bedtime. 06/04/22 11/20/22 Yes [place], Eric J, DO  losartan (COZAAR) 100 MG tablet Take 1 tablet (100 mg total) by mouth daily. 06/04/22 11/20/22 Yes [place], Eric J, DO  metFORMIN (GLUCOPHAGE) 500 MG tablet Take 1 tablet (500 mg total) by mouth 2 (two) times daily with a meal. 06/04/22 11/20/22 Yes [place], Eric J, DO  pantoprazole (PROTONIX) 40 MG tablet Take 1 tablet (40 mg total) by mouth daily. 06/04/22 11/20/22 Yes [place], Eric J, DO  PARoxetine (PAXIL) 20 MG tablet Take 1 tablet (20 mg total) by mouth daily. 10/10/22  Yes Georganna Skeans, MD  QUEtiapine (SEROQUEL) 200 MG tablet Take 1 tablet (200 mg total) by mouth at bedtime. 06/04/22 11/20/22 Yes [place], Eric J, DO  rosuvastatin (CRESTOR) 40 MG tablet Take 1 tablet (40 mg total) by mouth daily. 06/04/22 11/20/22 Yes [place], Eric J, DO    Physical Exam: Vitals:   11/23/22 1720 11/23/22 2105 11/24/22 0547 11/24/22 0917  BP: (!) 146/78 (!) 166/80 (!) 147/81 135/79  Pulse: 62 61  63 73  Resp: 18 18 14 16   Temp: 98.4 F (36.9 C) 98.6 F (37 C) 97.8 F (36.6 C) 98.7 F (37.1 C)  TempSrc: Oral Oral Oral Oral  SpO2: 99% 100% 98% 97%   Patient was seen laying in bed.  She is comfortable.  She was easily arousable.  Able to provide appropriate history. HEENT: Poor dentition.  Oral mucosa moist. Neck: Supple with no JVD CNS: Old residual left-sided weakness.  Unchanged compared to prior Abdomen: Soft nontender bowel sounds present Cardiovascular: S1-S2 with no murmur Skin: Negative for any new rash Chest: Clinically diminished bilaterally due to body habitus. Note: Depressed but stable. Data Reviewed:  A1c 6.6 on 03/2022  Family Communication: No family at bedside Primary team communication: ER will be updated pending A1c and Thank you very much for involving [place] in the care of your patient.  Author: Lilia Pro, MD 11/24/2022 11:47 AM  For on call review [email or web address].

## 2022-11-24 NOTE — ED Provider Notes (Signed)
Emergency Medicine Observation Re-evaluation Note  Kaitlyn Gray is a 47 y.o. female, seen on rounds today.  Pt initially presented to the ED for complaints of Weakness Currently, the patient is eating in room and resting.  Physical Exam  BP 135/79 (BP Location: Left Arm)   Pulse 73   Temp 98.7 F (37.1 C) (Oral)   Resp 16   SpO2 97%  Physical Exam General: Eating around no acute distress Cardiac: Regular rate Lungs: Breathing comfortably Psych: Calm not agitated  ED Course / MDM  EKG:EKG Interpretation  Date/Time:  Thursday November 20 2022 12:25:59 EDT Ventricular Rate:  58 PR Interval:  92 QRS Duration: 102 QT Interval:  490 QTC Calculation: 482 R Axis:   10 Text Interpretation: Ectopic atrial rhythm Short PR interval Probable left ventricular hypertrophy Borderline T abnormalities, anterior leads No significant change since last tracing Confirmed by Elayne Snare ([id number]) on 11/20/2022 12:33:13 PM  I have reviewed the labs performed to date as well as medications administered while in observation.  Recent changes in the last 24 hours include recent CBG check of 431.  I have requested hospitalist recommendations for diabetes management.  I have ordered for sliding scale.  Dr Thurston Pounds repeat checked A1c 6.4, repeat glucose was 185.  She will continue her metformin 500 twice daily.  No changes to regimen.  Plan  Current plan is for psychiatric inpatient [facility].Mardene Sayer, MD 11/24/22 [phone]

## 2022-12-01 ENCOUNTER — Other Ambulatory Visit (HOSPITAL_COMMUNITY): Payer: Self-pay

## 2022-12-01 ENCOUNTER — Other Ambulatory Visit: Payer: Self-pay

## 2022-12-01 MED ORDER — PAROXETINE HCL 20 MG PO TABS
20.0000 mg | ORAL_TABLET | Freq: Every day | ORAL | 1 refills | Status: DC
  Filled 2022-12-01: qty 15, 15d supply, fill #0

## 2022-12-01 MED ORDER — NICOTINE POLACRILEX 2 MG MT GUM
2.0000 mg | CHEWING_GUM | OROMUCOSAL | 1 refills | Status: DC | PRN
  Filled 2022-12-01: qty 220, 19d supply, fill #0

## 2022-12-01 MED ORDER — QUETIAPINE FUMARATE 200 MG PO TABS
200.0000 mg | ORAL_TABLET | Freq: Every day | ORAL | 1 refills | Status: DC
  Filled 2022-12-01: qty 15, 15d supply, fill #0

## 2022-12-01 MED ORDER — GABAPENTIN 100 MG PO CAPS
100.0000 mg | ORAL_CAPSULE | Freq: Three times a day (TID) | ORAL | 1 refills | Status: DC
  Filled 2022-12-01: qty 45, 15d supply, fill #0

## 2022-12-02 ENCOUNTER — Other Ambulatory Visit (HOSPITAL_COMMUNITY): Payer: Self-pay

## 2022-12-02 ENCOUNTER — Other Ambulatory Visit: Payer: Self-pay

## 2022-12-05 ENCOUNTER — Emergency Department (HOSPITAL_COMMUNITY)
Admission: EM | Admit: 2022-12-05 | Discharge: 2022-12-06 | Disposition: A | Discharge status: Home / Self Care | Payer: Medicaid Other | Attending: Emergency Medicine | Admitting: Emergency Medicine

## 2022-12-05 ENCOUNTER — Other Ambulatory Visit: Payer: Self-pay

## 2022-12-05 DIAGNOSIS — I10 Essential (primary) hypertension: Secondary | ICD-10-CM | POA: Diagnosis not present

## 2022-12-05 DIAGNOSIS — R404 Transient alteration of awareness: Secondary | ICD-10-CM | POA: Diagnosis not present

## 2022-12-05 DIAGNOSIS — F1210 Cannabis abuse, uncomplicated: Secondary | ICD-10-CM | POA: Insufficient documentation

## 2022-12-05 DIAGNOSIS — R4182 Altered mental status, unspecified: Secondary | ICD-10-CM | POA: Diagnosis present

## 2022-12-05 MED ORDER — LORAZEPAM 2 MG/ML IJ SOLN
2.0000 mg | Freq: Once | INTRAMUSCULAR | Status: AC
  Administered 2022-12-06: 2 mg via INTRAVENOUS
  Filled 2022-12-05: qty 1

## 2022-12-05 MED ORDER — SODIUM CHLORIDE 0.9 % IV BOLUS
500.0000 mL | Freq: Once | INTRAVENOUS | Status: AC
  Administered 2022-12-06: 500 mL via INTRAVENOUS

## 2022-12-05 NOTE — ED Provider Notes (Signed)
Emergency Department Provider Note   I have reviewed the triage vital signs and the nursing notes.   HISTORY  Chief Complaint Altered Mental Status   HPI Kaitlyn Gray is a 47 y.o. female with PMH reviewed below including prior CVA and pseudo-CVA presents to the ED with AMS and agitation with EMS. She arrives after apparently calling 911 herself. She seemed confused and had abnormal speech at the time. On arrival, she was intermittently agitated with EMS but ultimately came to the ED voluntarily. EMS report that they have had similar encounters with her in the past. Patient denies any pain but does appear altered. Level 5 caveat applies.    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    Encounter for assessment of healthcare decision-making capacity    History of cervical fracture 12/24/2017   nondisplaced fracture lateral mass of C1 on the right on CT 12/24/17   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) 07/19/2017   Hypertension    Hypertensive emergency 05/31/2022   Ischemic stroke (HCC) 01/01/2020   subacute right middle cerebellar peduncle and pons infarction   Left-sided weakness 01/27/2022   Head CT with remote right occipital infarct which is consistent with old left-sided weakness   MDD (major depressive disorder), recurrent severe, 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 Speaking in baby talk intermittently.  Eyes: Conjunctivae are normal. PERRL (4mm) Head: Atraumatic. Nose: No congestion/rhinnorhea. Mouth/Throat: Mucous membranes are moist.  Neck: No stridor.   Cardiovascular: Normal rate, regular rhythm. Good peripheral circulation. Grossly normal heart sounds.   Respiratory: Normal respiratory effort.  No retractions. Lungs CTAB. Gastrointestinal: Soft and nontender. No distention.  Musculoskeletal: No lower extremity tenderness nor edema. No gross deformities of extremities. Neurologic: Speech intermittently slurred with episodes of babbling/baby talk. Shouting at times as well. No gross focal neurologic deficits are appreciated.  Skin:  Skin is warm, dry and intact. No rash noted.  ____________________________________________   LABS (all labs ordered are listed, but only abnormal results are displayed)  Labs Reviewed  COMPREHENSIVE METABOLIC PANEL - Abnormal; Notable for the following components:      Result Value   Potassium 3.1 (*)    Glucose, Bld 103 (*)    Albumin 3.4 (*)    All other components within normal limits  ACETAMINOPHEN LEVEL - Abnormal; Notable for the following components:   Acetaminophen (Tylenol), Serum <10 (*)    All other components within normal limits  CBC WITH DIFFERENTIAL/PLATELET - Abnormal; Notable for the following components:   RBC 3.86 (*)    Hemoglobin 8.5 (*)    HCT 29.5 (*)    MCV 76.4 (*)    MCH 22.0 (*)    MCHC  28.8 (*)    RDW 20.9 (*)    All other components within normal limits  SALICYLATE LEVEL - Abnormal; Notable for the following components:   Salicylate Lvl <7.0 (*)    All other components within normal limits  ETHANOL  RAPID URINE DRUG SCREEN, HOSP PERFORMED  URINALYSIS, ROUTINE W REFLEX MICROSCOPIC  TROPONIN I (HIGH SENSITIVITY)   TROPONIN I (HIGH SENSITIVITY)   ____________________________________________  EKG   EKG Interpretation  Date/Time:  Friday December 05 2022 23:53:59 EDT Ventricular Rate:  99 PR Interval:  145 QRS Duration: 97 QT Interval:  408 QTC Calculation: 519 R Axis:   149 Text Interpretation: Right and left arm electrode reversal, interpretation assumes no reversal Sinus rhythm Ventricular premature complex Probable left atrial enlargement Probable lateral infarct, age indeterminate Prolonged QT interval Confirmed by Alona Bene [phone]) on 12/06/2022 12:01:09 AM        ____________________________________________  RADIOLOGY  MR BRAIN WO CONTRAST  Result Date: 12/06/2022 CLINICAL DATA:  Stroke follow-up EXAM: MRI HEAD WITHOUT CONTRAST TECHNIQUE: Multiplanar, multiecho pulse sequences of the brain and surrounding structures were obtained without intravenous contrast. COMPARISON:  Head CT from earlier today.  Brain MRI 10/23/2022 FINDINGS: Brain: No acute infarction, hemorrhage, hydrocephalus, extra-axial collection or mass lesion. Chronic infarcts in the right brachium pontis/cerebellum, bilateral deep gray nuclei, right occipital cortex, corpus callosum, brainstem, and left parietal cortex and subjacent white matter. Premature chronic small vessel ischemia in the cerebral white matter. Chronic blood products are seen at the old infarcts. Vascular: Normal flow voids. Skull and upper cervical spine: Normal marrow signal. Sinuses/Orbits: Negative. IMPRESSION: 1. No acute or reversible finding. 2. 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 Electronically Signed   By: Tiburcio Pea M.D.   On: 12/06/2022 06:33   CT Head Wo Contrast  Result Date: 12/06/2022 CLINICAL DATA:  Neuro deficit, acute stroke suspected EXAM: CT HEAD WITHOUT CONTRAST TECHNIQUE: Contiguous axial images were obtained from the base of the skull through the vertex without intravenous contrast.  RADIATION DOSE REDUCTION: This exam was performed according to the departmental dose-optimization program which includes automated exposure control, adjustment of the mA and/or kV according to patient size and/or use of iterative reconstruction technique. COMPARISON:  CT head 11/20/2022 FINDINGS: Brain: No intracranial hemorrhage, mass effect, or evidence of acute infarct. No hydrocephalus. No extra-axial fluid collection. Generalized cerebral atrophy. Ill-defined hypoattenuation within the cerebral white matter is nonspecific but consistent with chronic small vessel ischemic disease. Chronic right occipital and left posterior parietal infarcts. Vascular: No hyperdense vessel. Intracranial arterial calcification. Skull: No fracture or focal lesion. Sinuses/Orbits: Orbits are unremarkable. Mucosal thickening in the ethmoid air cells. Other: None. IMPRESSION: 1. No evidence of acute intracranial abnormality. 2. Chronic small vessel ischemic disease and chronic infarcts. Electronically Signed   By: Minerva Fester M.D.   On: 12/06/2022 00:57    ____________________________________________   PROCEDURES  Procedure(s) performed:   Procedures  CRITICAL CARE Performed by: Maia Plan Total critical care time: 35 minutes Critical care time was exclusive of separately billable procedures and treating other patients. Critical care was necessary to treat or prevent imminent or life-threatening deterioration. Critical care was time spent personally by me on the following activities: development of treatment plan with patient and/or surrogate as well as nursing, discussions with consultants, evaluation of patient's response to treatment, examination of patient, obtaining history from patient or surrogate, ordering and performing treatments and interventions, ordering and review of laboratory studies, ordering and review of  radiographic studies, pulse oximetry and re-evaluation of patient's condition.  Alona Bene, MD Emergency Medicine  ____________________________________________   INITIAL IMPRESSION / ASSESSMENT AND PLAN / ED COURSE  Pertinent labs & imaging results that were available during my care of the patient were reviewed by me and considered in my medical decision making (see chart for details).   This patient is Presenting for Evaluation of AMS, which does require a range of treatment options, and is a complaint that involves a high risk of morbidity and mortality.  The Differential Diagnoses includes but is not exclusive to alcohol, illicit or prescription medications, intracranial pathology such as stroke, intracerebral hemorrhage, fever or infectious causes including sepsis, hypoxemia, uremia, trauma, endocrine related disorders such as diabetes, hypoglycemia, thyroid-related diseases, etc.   Critical Interventions-    Medications  LORazepam (ATIVAN) injection 2 mg (2 mg Intravenous Given 12/06/22 0000)  sodium chloride 0.9 % bolus 500 mL (0 mLs Intravenous Stopped 12/06/22 0039)    Reassessment after intervention: mental status slightly improved.    I did obtain Additional Historical Information from EMS.  I decided to review pertinent External Data, and in summary patient with similar presentations in the past. Psychiatry admit this month at [facility].    Clinical Laboratory Tests Ordered, included CBC without leukocytosis. No AKI. EtOH negative. Troponin negative.   Radiologic Tests Ordered, included CT head. I independently interpreted the images and agree with radiology interpretation.   Cardiac Monitor Tracing which shows NSR.    Social Determinants of Health Risk patient is a smoker.   Medical Decision Making: Summary:  Patient presents to the emergency department with acute agitation.  She is intermittently shouting at staff and then goes into babbling type speech.  Moving extremities without difficulty.  She appears drowsy at times and then intermittently  agitated.  No clear history or exam to activate a code stroke despite her history. Will obtain CT imaging of the head and reassess. Patient did require ativan on arrival for treatment of agitation.   Reevaluation with update and discussion with patient at 07:00 AM. She opens her eyes to touch and frequent prompting. MRI without evidence of CVA. Plan to for continued observation to metabolize and likely d/c. Care transferred to Dr. Rodena Medin.   Considered admission but labs are reassuring and MRI negative for CVA.   Patient's presentation is most consistent with acute presentation with potential threat to life or bodily function.   Disposition: pending  ____________________________________________  FINAL CLINICAL IMPRESSION(S) / ED DIAGNOSES  Final diagnoses:  Transient alteration of awareness    Note:  This document was prepared using Dragon voice recognition software and may include unintentional dictation errors.  Alona Bene, MD, [facility] Emergency Medicine    Yohan Samons, Arlyss Repress, MD 12/07/22 [phone]

## 2022-12-05 NOTE — ED Triage Notes (Signed)
BIB EMS from home.  Called out for stroke like symptoms.  EMS reports they arrived pt was responsive to painful stimuli or very loud yelling and then pt becomes agitated.  Pt will not respond to questions appropriately. States "I'm tired'  VSS.

## 2022-12-06 ENCOUNTER — Emergency Department (HOSPITAL_COMMUNITY): Payer: Medicaid Other

## 2022-12-06 LAB — CBC WITH DIFFERENTIAL/PLATELET
Abs Immature Granulocytes: 0.01 10*3/uL (ref 0.00–0.07)
Basophils Absolute: 0.1 10*3/uL (ref 0.0–0.1)
Basophils Relative: 1 %
Eosinophils Absolute: 0.1 10*3/uL (ref 0.0–0.5)
Eosinophils Relative: 2 %
HCT: 29.5 % — ABNORMAL LOW (ref 36.0–46.0)
Hemoglobin: 8.5 g/dL — ABNORMAL LOW (ref 12.0–15.0)
Immature Granulocytes: 0 %
Lymphocytes Relative: 20 %
Lymphs Abs: 1.1 10*3/uL (ref 0.7–4.0)
MCH: 22 pg — ABNORMAL LOW (ref 26.0–34.0)
MCHC: 28.8 g/dL — ABNORMAL LOW (ref 30.0–36.0)
MCV: 76.4 fL — ABNORMAL LOW (ref 80.0–100.0)
Monocytes Absolute: 0.4 10*3/uL (ref 0.1–1.0)
Monocytes Relative: 8 %
Neutro Abs: 3.8 10*3/uL (ref 1.7–7.7)
Neutrophils Relative %: 69 %
Platelets: 319 10*3/uL (ref 150–400)
RBC: 3.86 MIL/uL — ABNORMAL LOW (ref 3.87–5.11)
RDW: 20.9 % — ABNORMAL HIGH (ref 11.5–15.5)
WBC: 5.5 10*3/uL (ref 4.0–10.5)
nRBC: 0 % (ref 0.0–0.2)

## 2022-12-06 LAB — COMPREHENSIVE METABOLIC PANEL
ALT: 16 U/L (ref 0–44)
AST: 15 U/L (ref 15–41)
Albumin: 3.4 g/dL — ABNORMAL LOW (ref 3.5–5.0)
Alkaline Phosphatase: 63 U/L (ref 38–126)
Anion gap: 12 (ref 5–15)
BUN: 15 mg/dL (ref 6–20)
CO2: 23 mmol/L (ref 22–32)
Calcium: 8.9 mg/dL (ref 8.9–10.3)
Chloride: 105 mmol/L (ref 98–111)
Creatinine, Ser: 0.82 mg/dL (ref 0.44–1.00)
GFR, Estimated: >60 mL/min (ref >60)
Glucose, Bld: 103 mg/dL — ABNORMAL HIGH (ref 70–99)
Potassium: 3.1 mmol/L — ABNORMAL LOW (ref 3.5–5.1)
Sodium: 140 mmol/L (ref 135–145)
Total Bilirubin: 0.5 mg/dL (ref 0.3–1.2)
Total Protein: 7.1 g/dL (ref 6.5–8.1)

## 2022-12-06 LAB — TROPONIN I (HIGH SENSITIVITY)
Troponin I (High Sensitivity): 4 ng/L (ref <18)
Troponin I (High Sensitivity): 4 ng/L (ref <18)

## 2022-12-06 LAB — ACETAMINOPHEN LEVEL: Acetaminophen (Tylenol), Serum: <10 ug/mL — ABNORMAL LOW (ref 10–30)

## 2022-12-06 LAB — ETHANOL: Alcohol, Ethyl (B): <10 mg/dL (ref <10)

## 2022-12-06 LAB — SALICYLATE LEVEL: Salicylate Lvl: <7 mg/dL — ABNORMAL LOW (ref 7.0–30.0)

## 2022-12-06 NOTE — ED Notes (Signed)
Pt provided bag lunch and beverage. She was informed after eating food she will need to ambulate in the hallway

## 2022-12-06 NOTE — Discharge Instructions (Signed)
Return for any problem.  ?

## 2022-12-06 NOTE — ED Notes (Signed)
Contacting social work for help with getting patient discharged home. Patient states she needs her walker that ems did not bring with her and states she cannot afford a ride home.

## 2022-12-06 NOTE — ED Provider Notes (Signed)
Patient seen and her prior EDP.  Patient now is able to awaken easily from sleep with verbal stimulation.  She reports that she would like to have some breakfast.  She is otherwise without current complaint.  ED workup was without evidence of significant acute pathology.  Patient is appropriate for discharge.  Importance of close follow-up was stressed.  Strict return precautions given and understood.   Wynetta Fines, MD 12/06/22 1135

## 2022-12-06 NOTE — ED Notes (Signed)
RN attempted to wake pt. Pt just rolled to side and shook head no.

## 2022-12-07 ENCOUNTER — Emergency Department (HOSPITAL_COMMUNITY)
Admission: EM | Admit: 2022-12-07 | Discharge: 2022-12-08 | Disposition: A | Discharge status: Home / Self Care | Payer: Medicaid Other | Attending: Emergency Medicine | Admitting: Emergency Medicine

## 2022-12-07 DIAGNOSIS — E876 Hypokalemia: Secondary | ICD-10-CM | POA: Insufficient documentation

## 2022-12-07 DIAGNOSIS — Z7982 Long term (current) use of aspirin: Secondary | ICD-10-CM | POA: Insufficient documentation

## 2022-12-07 DIAGNOSIS — R531 Weakness: Secondary | ICD-10-CM

## 2022-12-07 DIAGNOSIS — E119 Type 2 diabetes mellitus without complications: Secondary | ICD-10-CM | POA: Insufficient documentation

## 2022-12-07 DIAGNOSIS — Z79899 Other long term (current) drug therapy: Secondary | ICD-10-CM | POA: Insufficient documentation

## 2022-12-07 DIAGNOSIS — Z7984 Long term (current) use of oral hypoglycemic drugs: Secondary | ICD-10-CM | POA: Insufficient documentation

## 2022-12-07 DIAGNOSIS — Z91040 Latex allergy status: Secondary | ICD-10-CM | POA: Diagnosis not present

## 2022-12-07 DIAGNOSIS — Y900 Blood alcohol level of less than 20 mg/100 ml: Secondary | ICD-10-CM | POA: Insufficient documentation

## 2022-12-07 DIAGNOSIS — I10 Essential (primary) hypertension: Secondary | ICD-10-CM | POA: Diagnosis not present

## 2022-12-07 LAB — CBG MONITORING, ED: Glucose-Capillary: 98 mg/dL (ref 70–99)

## 2022-12-07 NOTE — ED Triage Notes (Signed)
Patient here from home reporting multiple falls and hitting head and bilateral knees.

## 2022-12-07 NOTE — ED Provider Notes (Signed)
[facility] EMERGENCY DEPARTMENT AT [facility] Provider Note   CSN: [id number] Arrival date & time: 12/07/22  2211     History {Add pertinent medical, surgical, social history, OB history to HPI:1} Chief Complaint  Patient presents with   Kaitlyn Gray    Kaitlyn Gray is a 47 y.o. female who presents from home today reporting multiple falls and feeling unwell.  Patient unable to describe in more detail her feeling of unwellness, falls that she describes are more stumbling than true falls to the ground.  Denies loss of consciousness nausea vomiting blurry double vision.  Patient agitated crying for water.  Patient speaking and babbling baby talk cadence, states her only pain right now is when she bumped her knee on the ED bed while climbing into it.  Patient ambulatory in the emergency department.  I have reviewed her medical records.  In the last month and a half she has had 2 episodes where she was documented to have similar speech and agitation. Trigger exhibited symptoms to prompt code stroke activation at this time.  Patient also has history of polysubstance use, type 2 diabetes and bipolar disorder.  Patient unable to answer questions regarding medication compliance.  Of note patient had CT head and MRI of the brain within the last 24 hours it was negative for acute intracranial pathology. HPI     Home Medications Prior to Admission medications   Medication Sig Start Date End Date Taking? Authorizing Provider  amLODipine (NORVASC) 10 MG tablet Take 1 tablet (10 mg total) by mouth daily. 06/04/22 11/20/22  [place], Alvira Philips, DO  aspirin EC 81 MG tablet Take 81 mg by mouth daily. Swallow whole.    [provider]  ferrous sulfate 325 (65 FE) MG tablet Take 1 tablet (325 mg total) by mouth every other day. 06/04/22 11/20/22  [place], Alvira Philips, DO  gabapentin (NEURONTIN) 100 MG capsule Take 1 capsule (100 mg total) by mouth 3 (three) times daily for pain scale 4-7. 12/01/22      gabapentin (NEURONTIN) 300 MG capsule Take 1 capsule (300 mg total) by mouth at bedtime. 06/04/22 11/20/22  [place], Alvira Philips, DO  losartan (COZAAR) 100 MG tablet Take 1 tablet (100 mg total) by mouth daily. 06/04/22 11/20/22  [place], Alvira Philips, DO  metFORMIN (GLUCOPHAGE) 500 MG tablet Take 1 tablet (500 mg total) by mouth 2 (two) times daily with a meal. 06/04/22 11/20/22  [place], Alvira Philips, DO  nicotine polacrilex (NICORETTE) 2 MG gum Take 1 each (2 mg total) by mouth every 2 (two) hours as needed for Smoking Cravings 12/01/22     pantoprazole (PROTONIX) 40 MG tablet Take 1 tablet (40 mg total) by mouth daily. 06/04/22 11/20/22  [place], Alvira Philips, DO  PARoxetine (PAXIL) 20 MG tablet Take 1 tablet (20 mg total) by mouth daily for depression. 12/01/22     QUEtiapine (SEROQUEL) 200 MG tablet Take 1 tablet (200 mg total) by mouth at bedtime. 06/04/22 11/20/22  [place], Alvira Philips, DO  QUEtiapine (SEROQUEL) 200 MG tablet Take 1 tablet (200 mg total) by mouth at bedtime for mood 12/01/22     rosuvastatin (CRESTOR) 40 MG tablet Take 1 tablet (40 mg total) by mouth daily. 06/04/22 11/20/22  [place], Eric J, DO      Allergies    Beef-derived products, Hydrocodone, and Latex    Review of Systems   Review of Systems  HENT: Negative.    Respiratory: Negative.    Cardiovascular: Negative.   Gastrointestinal:  Negative.   Musculoskeletal:  Positive for myalgias.  Neurological: Negative.     Physical Exam Updated Vital Signs BP (!) 148/93 (BP Location: Right Arm)   Pulse 70   Temp 98.3 F (36.8 C) (Oral)   Resp 17   SpO2 100%  Physical Exam Vitals and nursing note reviewed.  Constitutional:      Appearance: She is not ill-appearing or toxic-appearing.  HENT:     Head: Normocephalic and atraumatic.     Mouth/Throat:     Mouth: Mucous membranes are moist.     Pharynx: No oropharyngeal exudate or posterior oropharyngeal erythema.  Eyes:     General:        Right eye: No discharge.        Left eye: No discharge.      Extraocular Movements: Extraocular movements intact.     Conjunctiva/sclera: Conjunctivae normal.     Pupils: Pupils are equal, round, and reactive to light.  Cardiovascular:     Rate and Rhythm: Normal rate and regular rhythm.     Pulses: Normal pulses.     Heart sounds: Normal heart sounds. No murmur heard. Pulmonary:     Effort: Pulmonary effort is normal. No respiratory distress.     Breath sounds: Normal breath sounds. No wheezing or rales.  Abdominal:     General: Bowel sounds are normal. There is no distension.     Palpations: Abdomen is soft.     Tenderness: There is no abdominal tenderness. There is no guarding or rebound.  Musculoskeletal:        General: No deformity.     Cervical back: Neck supple.     Right lower leg: No edema.     Left lower leg: No edema.  Skin:    General: Skin is warm and dry.     Capillary Refill: Capillary refill takes less than 2 seconds.  Neurological:     General: No focal deficit present.     Mental Status: She is alert and oriented to person, place, and time. Mental status is at baseline.     GCS: GCS eye subscore is 4. GCS verbal subscore is 4. GCS motor subscore is 6.     Cranial Nerves: Cranial nerves 2-12 are intact.     Sensory: Sensation is intact.     Motor: Motor function is intact.     Gait: Gait is intact.     Comments: Patient with poor participation and coordination exams, however remainder of neurologic exam is nonfocal.  Per chart review it appears patient is her mental status baseline  Psychiatric:        Mood and Affect: Mood normal.     ED Results / Procedures / Treatments   Labs (all labs ordered are listed, but only abnormal results are displayed) Labs Reviewed  CBC WITH DIFFERENTIAL/PLATELET  COMPREHENSIVE METABOLIC PANEL  ETHANOL  RAPID URINE DRUG SCREEN, HOSP PERFORMED  CBG MONITORING, ED    EKG None  Radiology MR BRAIN WO CONTRAST  Result Date: 12/06/2022 CLINICAL DATA:  Stroke follow-up EXAM: MRI  HEAD WITHOUT CONTRAST TECHNIQUE: Multiplanar, multiecho pulse sequences of the brain and surrounding structures were obtained without intravenous contrast. COMPARISON:  Head CT from earlier today.  Brain MRI 10/23/2022 FINDINGS: Brain: No acute infarction, hemorrhage, hydrocephalus, extra-axial collection or mass lesion. Chronic infarcts in the right brachium pontis/cerebellum, bilateral deep gray nuclei, right occipital cortex, corpus callosum, brainstem, and left parietal cortex and subjacent white matter. Premature chronic small vessel ischemia in  the cerebral white matter. Chronic blood products are seen at the old infarcts. Vascular: Normal flow voids. Skull and upper cervical spine: Normal marrow signal. Sinuses/Orbits: Negative. IMPRESSION: 1. No acute or reversible finding. 2. 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 Electronically Signed   By: Tiburcio Pea M.D.   On: 12/06/2022 06:33   CT Head Wo Contrast  Result Date: 12/06/2022 CLINICAL DATA:  Neuro deficit, acute stroke suspected EXAM: CT HEAD WITHOUT CONTRAST TECHNIQUE: Contiguous axial images were obtained from the base of the skull through the vertex without intravenous contrast. RADIATION DOSE REDUCTION: This exam was performed according to the departmental dose-optimization program which includes automated exposure control, adjustment of the mA and/or kV according to patient size and/or use of iterative reconstruction technique. COMPARISON:  CT head 11/20/2022 FINDINGS: Brain: No intracranial hemorrhage, mass effect, or evidence of acute infarct. No hydrocephalus. No extra-axial fluid collection. Generalized cerebral atrophy. Ill-defined hypoattenuation within the cerebral white matter is nonspecific but consistent with chronic small vessel ischemic disease. Chronic right occipital and left posterior parietal infarcts. Vascular: No hyperdense vessel. Intracranial arterial calcification. Skull: No fracture  or focal lesion. Sinuses/Orbits: Orbits are unremarkable. Mucosal thickening in the ethmoid air cells. Other: None. IMPRESSION: 1. No evidence of acute intracranial abnormality. 2. Chronic small vessel ischemic disease and chronic infarcts. Electronically Signed   By: Minerva Fester M.D.   On: 12/06/2022 00:57    Procedures Procedures  {Document cardiac monitor, telemetry assessment procedure when appropriate:1}  Medications Ordered in ED Medications - No data to display  ED Course/ Medical Decision Making/ A&P   {   Click here for ABCD2, HEART and other calculatorsREFRESH Note before signing :1}                          Medical Decision Making 47 y/o female who presents with report of frequent falls, but also is crying asking for water.   HTN on intake, VS otherwise normal. Cardipulmonary exam is normal abdominal exam is benign. Neuro exam is nonfocal.    ***  {Document critical care time when appropriate:1} {Document review of labs and clinical decision tools ie heart score, Chads2Vasc2 etc:1}  {Document your independent review of radiology images, and any outside records:1} {Document your discussion with family members, caretakers, and with consultants:1} {Document social determinants of health affecting pt's care:1} {Document your decision making why or why not admission, treatments were needed:1} Final Clinical Impression(s) / ED Diagnoses Final diagnoses:  None    Rx / DC Orders ED Discharge Orders     None

## 2022-12-08 ENCOUNTER — Ambulatory Visit (HOSPITAL_COMMUNITY)
Admission: EM | Admit: 2022-12-08 | Discharge: 2022-12-08 | Disposition: A | Discharge status: Home / Self Care | Payer: Medicaid Other | Attending: Family | Admitting: Family

## 2022-12-08 DIAGNOSIS — F4324 Adjustment disorder with disturbance of conduct: Secondary | ICD-10-CM | POA: Insufficient documentation

## 2022-12-08 DIAGNOSIS — Z8673 Personal history of transient ischemic attack (TIA), and cerebral infarction without residual deficits: Secondary | ICD-10-CM | POA: Insufficient documentation

## 2022-12-08 DIAGNOSIS — Z91148 Patient's other noncompliance with medication regimen for other reason: Secondary | ICD-10-CM | POA: Insufficient documentation

## 2022-12-08 DIAGNOSIS — R45851 Suicidal ideations: Secondary | ICD-10-CM | POA: Insufficient documentation

## 2022-12-08 LAB — COMPREHENSIVE METABOLIC PANEL
ALT: 14 U/L (ref 0–44)
AST: 13 U/L — ABNORMAL LOW (ref 15–41)
Albumin: 3.5 g/dL (ref 3.5–5.0)
Alkaline Phosphatase: 63 U/L (ref 38–126)
Anion gap: 8 (ref 5–15)
BUN: 15 mg/dL (ref 6–20)
CO2: 27 mmol/L (ref 22–32)
Calcium: 8.7 mg/dL — ABNORMAL LOW (ref 8.9–10.3)
Chloride: 109 mmol/L (ref 98–111)
Creatinine, Ser: 0.88 mg/dL (ref 0.44–1.00)
GFR, Estimated: >60 mL/min (ref >60)
Glucose, Bld: 89 mg/dL (ref 70–99)
Potassium: 3.2 mmol/L — ABNORMAL LOW (ref 3.5–5.1)
Sodium: 144 mmol/L (ref 135–145)
Total Bilirubin: 0.6 mg/dL (ref 0.3–1.2)
Total Protein: 7.2 g/dL (ref 6.5–8.1)

## 2022-12-08 LAB — CBC WITH DIFFERENTIAL/PLATELET
Abs Immature Granulocytes: 0 10*3/uL (ref 0.00–0.07)
Basophils Absolute: 0 10*3/uL (ref 0.0–0.1)
Basophils Relative: 1 %
Eosinophils Absolute: 0.2 10*3/uL (ref 0.0–0.5)
Eosinophils Relative: 4 %
HCT: 30.8 % — ABNORMAL LOW (ref 36.0–46.0)
Hemoglobin: 8.9 g/dL — ABNORMAL LOW (ref 12.0–15.0)
Immature Granulocytes: 0 %
Lymphocytes Relative: 29 %
Lymphs Abs: 1.8 10*3/uL (ref 0.7–4.0)
MCH: 22.4 pg — ABNORMAL LOW (ref 26.0–34.0)
MCHC: 28.9 g/dL — ABNORMAL LOW (ref 30.0–36.0)
MCV: 77.6 fL — ABNORMAL LOW (ref 80.0–100.0)
Monocytes Absolute: 0.3 10*3/uL (ref 0.1–1.0)
Monocytes Relative: 5 %
Neutro Abs: 3.7 10*3/uL (ref 1.7–7.7)
Neutrophils Relative %: 61 %
Platelets: 348 10*3/uL (ref 150–400)
RBC: 3.97 MIL/uL (ref 3.87–5.11)
RDW: 21.3 % — ABNORMAL HIGH (ref 11.5–15.5)
WBC: 6.1 10*3/uL (ref 4.0–10.5)
nRBC: 0 % (ref 0.0–0.2)

## 2022-12-08 LAB — ETHANOL: Alcohol, Ethyl (B): <10 mg/dL (ref <10)

## 2022-12-08 MED ORDER — HYDROXYZINE HCL 25 MG PO TABS: 25.0000 mg | ORAL_TABLET | Freq: Three times a day (TID) | ORAL | Status: DC | PRN

## 2022-12-08 MED ORDER — MAGNESIUM HYDROXIDE 400 MG/5ML PO SUSP: 30.0000 mL | Freq: Every day | ORAL | Status: DC | PRN

## 2022-12-08 MED ORDER — TRAZODONE HCL 50 MG PO TABS: 50.0000 mg | ORAL_TABLET | Freq: Every evening | ORAL | Status: DC | PRN

## 2022-12-08 MED ORDER — ACETAMINOPHEN 325 MG PO TABS: 650.0000 mg | ORAL_TABLET | Freq: Four times a day (QID) | ORAL | Status: DC | PRN

## 2022-12-08 MED ORDER — ALUM & MAG HYDROXIDE-SIMETH 200-200-20 MG/5ML PO SUSP: 30.0000 mL | ORAL | Status: DC | PRN

## 2022-12-08 NOTE — Discharge Instructions (Signed)
Please follow up with your PCP. You may expect a call for assistance with setting up home physical therapy. Return to the ER with any new severe symptoms.

## 2022-12-08 NOTE — BH Assessment (Signed)
Comprehensive Clinical Assessment (CCA) Note  12/08/2022 Kaitlyn Gray [id number]   Disposition: Per Hillery Jacks, NP patient does not meet inpatient criteria.  Patient has been referred to [facility] for outpatient treatment.   The patient demonstrates the following risk factors for suicide: Chronic risk factors for suicide include: psychiatric disorder of Bipolar Disorder and substance use disorder. Acute risk factors for suicide include: social withdrawal/isolation, loss (financial, interpersonal, professional), and recent discharge from inpatient psychiatry. Protective factors for this patient include: coping skills and hope for the future. Considering these factors, the overall suicide risk at this point appears to be low. Patient is appropriate for outpatient follow up.  Patient is a 47 year old female with a history of Bipolar Disorder and Cocaine Use Disorder, severe who presents voluntarily to [facility] for assessment.  Patient presents after taking the bus from [facility]. Patient states she went to [facility] due to medical problems and states she also reported to staff that she was experiencing suicidal ideation. She states they discharged her and made her leave the property, "even after they saw me hit myself in the head."  She states this made her very distressed and more intent on suicide.  On arrival to [facility], patient indicates she is having suicidal thoughts with plan to overdose on Seroquel. She also reports she is hearing voices telling her to kill herself. Per chart review, patient has multiple past similar presentations and she appears to have chronic suicidal thoughts, often reported for secondary gain of shelter, food, housing.  She is inconsistent with reports, at one moment stating she is having suicidal thoughts with a plan and moments later stating she needs to leave while denying SI.  She is now insistent that she needs to speak with her landlord and has  "some things I need to take care of so I don't lose my place."  She is now adamantly denying SI and appears quite future oriented with protective factors.  Medical record indicates patient has a history of Bipolar Disorder and substance use.  Today she denies recent alcohol or substance use, however during an assessment 2 weeks ago, she had indicated she uses appox. 2 grams of cocaine daily and has been for 5-6 years.  Past urine drug screens have been positive for cocaine and cannabis. Patient says she was discharged from [facility] two weeks ago and it was not a good experience. She states she does not have any outpatient mental health providers. She is denying current SI and HI.  Patient states that she really needs to "get back on my shot, that helped me a lot by not having to remember to take pills."  Patient affirms her safety and engaged in safety planning with this clinician and provider, Hillery Jacks, NP.  She is cleared for discharge with plan to follow up with [facility] for shot clinic.     Chief Complaint:  Chief Complaint  Patient presents with   Suicidal   Visit Diagnosis: Bipolar Disorder                             Cocaine Use Disorder, severe    CCA Screening, Triage and Referral (STR)  Patient Reported Information How did you hear about [place]? Self  What Is the Reason for Your Visit/Call Today? Pt presents unaccompanied to [facility] after taking bus from [facility]. She says she went to [facility] due to medical problems and  also reported she was experiencing suicidal ideation. She says they discharged her and made her leave the property. She states this made her very distressed and more intent on suicide. She reports suicidal ideation with plan to overdose on Seroquel. She states she is hearing voices telling her to kill herself. Pt's medical record indicates Pt has a history of bipolar disorder and substance use. She denies recent alcohol or substance use and past urine drug screens have  been positive for cocaine and cannabis. She says she was discharged from [facility] two weeks ago and it was not a good experience. She states she does not have any outpatient mental health providers. She denies homicidal ideation.  How Long Has This Been Causing You Problems? > than 6 months  What Do You Feel Would Help You the Most Today? Treatment for Depression or other mood problem; Medication(s)   Have You Recently Had Any Thoughts About Hurting Yourself? Yes  Are You Planning to Commit Suicide/Harm Yourself At This time? Yes (Plan to overdose on Seroquel)   Flowsheet Row ED from 12/07/2022 in [facility] Emergency Department at [facility] ED from 12/05/2022 in [facility] Emergency Department at [facility] ED from 11/20/2022 in [facility] Emergency Department at [facility]  C-SSRS RISK CATEGORY No Risk No Risk No Risk       Have you Recently Had Thoughts About Hurting Someone [name]? No  Are You Planning to Harm Someone at This Time? No  Explanation: Pt reports suicidal ideation with plan to overdose on Seroquel   Have You Used Any Alcohol or Drugs in the Past 24 Hours? No  What Did You Use and How Much? Pt denies use of alcohol or substances within the past 24 hours.   Do You Currently Have a Therapist/Psychiatrist? No  Name of Therapist/Psychiatrist: Name of Therapist/Psychiatrist: N/A   Have You Been Recently Discharged From Any Office Practice or Programs? No  Explanation of Discharge From Practice/Program: N/A     CCA Screening Triage Referral Assessment Type of Contact: Face-to-Face  Telemedicine Service Delivery:   Is this Initial or Reassessment? Is this Initial or Reassessment?: Initial Assessment  Date Telepsych consult ordered in [facility]:  Date Telepsych consult ordered in [facility]: 12/08/22  Time Telepsych consult ordered in [facility]:  Time Telepsych consult ordered in [facility]: 0823  Location of Assessment: [facility] Assessment  Services  Provider Location: [facility] Assessment Services   Collateral Involvement: N/A   Does Patient Have a Automotive engineer Guardian? No  Legal Guardian Contact Information: N/A  Copy of Legal Guardianship Form: -- (N/A)  Legal Guardian Notified of Arrival: -- (N/A)  Legal Guardian Notified of Pending Discharge: -- (N/A)  If Minor and Not Living with Parent(s), Who has Custody? N/A  Is CPS involved or ever been involved? Never  Is APS involved or ever been involved? Never   Patient Determined To Be At Risk for Harm To Self or Others Based on Review of Patient Reported Information or Presenting Complaint? Yes, for Self-Harm  Method: -- (N/A, no HI)  Availability of Means: -- (N/A, no HI)  Intent: -- (N/A, no HI)  Notification Required: -- (N/A, no HI)  Additional Information for Danger to Others Potential: -- (N/A, no HI)  Additional Comments for Danger to Others Potential: N/A, no HI  Are There Guns or Other Weapons in Your Home? No  Types of Guns/Weapons: N/A  Are These Weapons Safely Secured?                            -- (  N/A)  Who Could Verify You Are Able To Have These Secured: N/A  Do You Have any Outstanding Charges, Pending Court Dates, Parole/Probation? None  Contacted To Inform of Risk of Harm To Self or Others: Other: Comment (BH providers - Pt states she has no support system)    Does Patient Present under Involuntary Commitment? No    [place] of Residence: [place]   Patient Currently Receiving the Following Services: Not Receiving Services   Determination of Need: Urgent (48 hours)   Options For Referral: [facility]; Medication Management; Outpatient Therapy; Intensive Outpatient Therapy     CCA Biopsychosocial Patient Reported Schizophrenia/Schizoaffective Diagnosis in Past: No   Strengths: Patient presents seeking treatment   Mental Health Symptoms Depression:   Difficulty Concentrating; Hopelessness;  Irritability; Tearfulness   Duration of Depressive symptoms:  Duration of Depressive Symptoms: Greater than two weeks   Mania:   None   Anxiety:    Tension; Worrying   Psychosis:   Hallucinations   Duration of Psychotic symptoms:  Duration of Psychotic Symptoms: Greater than six months   Trauma:   None   Obsessions:   None   Compulsions:   None   Inattention:   None   Hyperactivity/Impulsivity:   None   Oppositional/Defiant Behaviors:   None   Emotional Irregularity:   Mood lability; Potentially harmful impulsivity; Recurrent suicidal behaviors/gestures/threats   Other Mood/Personality Symptoms:   Chronic SI, stating she is more comfortable "being in hospitals, around people."    Mental Status Exam Appearance and self-care  Stature:   Average   Weight:   Average weight   Clothing:   Casual   Grooming:   Normal   Cosmetic use:   None   Posture/gait:   Normal   Motor activity:   Slowed (related to stroke hx)   Sensorium  Attention:   Normal   Concentration:   Normal   Orientation:   X5   Recall/memory:   -- (uta)   Affect and Mood  Affect:   Blunted   Mood:   Pessimistic   Relating  Eye contact:   Fleeting   Facial expression:   Depressed   Attitude toward examiner:   Cooperative; Dramatic   Thought and Language  Speech flow:  Garbled; Paucity; Slurred (slurred speech - hx of mult. strokes)   Thought content:   Appropriate to Mood and Circumstances   Preoccupation:   None   Hallucinations:   Visual; Auditory   Organization:   Coherent   Company secretary of Knowledge:   Average   Intelligence:   Average   Abstraction:   Functional   Judgement:   Impaired   Reality Testing:   Adequate   Insight:   Flashes of insight; Poor   Decision Making:   Impulsive   Social Functioning  Social Maturity:   Impulsive   Social Judgement:   Heedless   Stress  Stressors:   Housing;  Office manager Ability:   Overwhelmed   Skill Deficits:   Scientist, physiological; Self-control   Supports:   Friends/Service system     Religion: Religion/Spirituality Are You A Religious Person?: No How Might This Affect Treatment?: N/A  Leisure/Recreation: Leisure / Recreation Do You Have Hobbies?: No  Exercise/Diet: Exercise/Diet Do You Exercise?: No Have You Gained or Lost A Significant Amount of Weight in the Past Six Months?: No (patient reports weight loss occurred in the past 5 months) Number of Pounds Gained: 0 Number of Pounds Lost?:  60 Do You Follow a Special Diet?: No Do You Have Any Trouble Sleeping?: Yes Explanation of Sleeping Difficulties: patient reports sleep is "up and down"   CCA Employment/Education Employment/Work Situation: Employment / Work Situation Employment Situation: Unemployed (Unemployed. Patient states that she is unable to work due to her medical issues.) Why is Patient on Disability: N/A,  patient does not receive disability How Long has Patient Been on Disability: Patient states that she does not receive disability. States that she does not have an income. Patient's Job has Been Impacted by Current Illness: No Has Patient ever Been in the [employer]?: No  Education: Education Last Grade Completed: 11 Did You Attend College?: No Did You Have An Individualized Education Program (IIEP): No Did You Have Any Difficulty At School?: No Patient's Education Has Been Impacted by Current Illness: No   CCA Family/Childhood History Family and Relationship History: Family history Marital status: Single Does patient have children?: Yes How many children?: 3 How is patient's relationship with their children?: Per chart review patient recently reported she has children, ages 9 and 42. Today, patient states she has a 69 and 76 year old children who live in group homes.  Childhood History:  Childhood History By whom was/is the patient raised?:  Grandparents Did patient suffer any verbal/emotional/physical/sexual abuse as a child?: No Did patient suffer from severe childhood neglect?: No Has patient ever been sexually abused/assaulted/raped as an adolescent or adult?: No Was the patient ever a victim of a crime or a disaster?: No Witnessed domestic violence?: No Has patient been affected by domestic violence as an adult?: No       CCA Substance Use Alcohol/Drug Use: Alcohol / Drug Use Pain Medications: SEE MAR Prescriptions: SEE MAR Over the Counter: SEE MAR History of alcohol / drug use?: Yes Longest period of sobriety (when/how long): 8 month per pt report Withdrawal Symptoms: None Substance #1 Name of Substance 1: Cocaine 1 - Age of First Use: 41 1 - Amount (size/oz): 2 grams per use 1 - Frequency: denies today, recently reported daily use(Assessment 2 wks ago) 1 - Duration: daily use for past 5-6 yrs per assessment 2 wks ago - today, does not discuss use hx 1 - Last Use / Amount: unknown 1 - Method of Aquiring: varies 1- Route of Use: inhalation Substance #2 Name of Substance 2: THC 2 - Age of First Use: teen; 47 yrs old 2 - Amount (size/oz): 1 blunt 2 - Frequency: "occasionally" 2 - Duration: ongoing 2 - Last Use / Amount: unknown 2 - Method of Aquiring: varies 2 - Route of Substance Use: smokes                     ASAM's:  Six Dimensions of Multidimensional Assessment  Dimension 1:  Acute Intoxication and/or Withdrawal Potential:   Dimension 1:  Description of individual's past and current experiences of substance use and withdrawal: Patient does not appear impaired  Dimension 2:  Biomedical Conditions and Complications:   Dimension 2:  Description of patient's biomedical conditions and  complications: Hx of mult strokes, difficulty ambulating  Dimension 3:  Emotional, Behavioral, or Cognitive Conditions and Complications:  Dimension 3:  Description of emotional, behavioral, or cognitive  conditions and complications: Underlying Bipolar Disorder - untreateed, states she forgets to take her meds  Dimension 4:  Readiness to Change:  Dimension 4:  Description of Readiness to Change criteria: Does not mention hx of SA today - chart review indicates long hx of  cocaine use  Dimension 5:  Relapse, Continued use, or Continued Problem Potential:  Dimension 5:  Relapse, continued use, or continued problem potential critiera description: hx of multiple relapses  Dimension 6:  Recovery/Living Environment:  Dimension 6:  Recovery/Iiving environment criteria description: No support  ASAM Severity Score: ASAM's Severity Rating Score: 12  ASAM Recommended Level of Treatment: ASAM Recommended Level of Treatment: Level III Residential Treatment   Substance use Disorder (SUD) Substance Use Disorder (SUD)  Checklist Symptoms of Substance Use: Continued use despite having a persistent/recurrent physical/psychological problem caused/exacerbated by use, Continued use despite persistent or recurrent social, interpersonal problems, caused or exacerbated by use, Persistent desire or unsuccessful efforts to cut down or control use, Social, occupational, recreational activities given up or reduced due to use  Recommendations for Services/Supports/Treatments: Recommendations for Services/Supports/Treatments Recommendations For Services/Supports/Treatments: Individual Therapy, CST Media planner), Medication Management, SAIOP (Substance Abuse Intensive Outpatient Program), CD-IOP Intensive Chemical Dependency Program, Inpatient Hospitalization, Facility Based Crisis  Discharge Disposition:    DSM5 Diagnoses: Patient Active Problem List   Diagnosis Date Noted   Stroke-like symptoms 08/05/2022   Toxic encephalopathy 08/04/2022   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) 05/01/2022   Iron deficiency anemia 04/07/2022   Fall at home, initial encounter 04/07/2022   Ambulatory  dysfunction 04/07/2022   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 04/07/2022   Class 1 obesity 04/06/2022   Tobacco abuse 04/06/2022   Abdominal pain 04/04/2022   History of stroke 03/31/2022   Hemiparesis affecting left side as late effect of cerebrovascular accident (CVA) (HCC) 03/31/2022   Opiate use    Microcytic anemia 03/30/2022   Noncompliance with medications    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   History of cervical fracture 12/24/2017   Type 2 diabetes mellitus (HCC) 12/06/2017   Hypertension associated with diabetes (HCC) 12/06/2017   Bipolar disorder (HCC) 07/19/2017   Polysubstance abuse (HCC) 07/19/2017   Depression 03/24/2015   Cannabis abuse 03/23/2013     Referrals to Alternative Service(s): Referred to Alternative Service(s):   Place:   Date:   Time:    Referred to Alternative Service(s):   Place:   Date:   Time:    Referred to Alternative Service(s):   Place:   Date:   Time:    Referred to Alternative Service(s):   Place:   Date:   Time:     Yetta Glassman, [facility]

## 2022-12-08 NOTE — Discharge Instructions (Addendum)

## 2022-12-08 NOTE — ED Notes (Signed)
Pt to restroom, hat was placed in toilet, pt did not use hat , no urine was able to be collected

## 2022-12-08 NOTE — Progress Notes (Signed)
   12/08/22 0625  [facility]UC Triage Screening (Walk-ins at [facility] only)  How Did You Hear About [place]? Self  What Is the Reason for Your Visit/Call Today? Pt presents unaccompanied to [facility] after taking bus from [facility]. She says she went to [facility] due to medical problems and also reported she was experiencing suicidal ideation. She says they discharged her and made her leave the property. She states this made her very distressed and more intent on suicide. She reports suicidal ideation with plan to overdose on Seroquel. She states she is hearing voices telling her to kill herself. Pt's medical record indicates Pt has a history of bipolar disorder and substance use. She denies recent alcohol or substance use and past urine drug screens have been positive for cocaine and cannabis. She says she was discharged from [facility] two weeks ago and it was not a good experience. She states she does not have any outpatient mental health providers. She denies homicidal ideation.  How Long Has This Been Causing You Problems? > than 6 months  Have You Recently Had Any Thoughts About Hurting Yourself? Yes  How long ago did you have thoughts about hurting yourself? Current suicidal ideation  Are You Planning to Commit Suicide/Harm Yourself At This time? Yes (Plan to overdose on Seroquel)  Have you Recently Had Thoughts About Hurting Someone [name]? No  Are You Planning To Harm Someone At This Time? No  Explanation: Pt reports suicidal ideation with plan to overdose on Seroquel  Are you currently experiencing any auditory, visual or other hallucinations? Yes  Please explain the hallucinations you are currently experiencing: Pt reports hearing voices telling her to kill herself.  Have You Used Any Alcohol or Drugs in the Past 24 Hours? No  What Did You Use and How Much? Pt denies use of alcohol or substances within the past 24 hours.  Do you have any current medical co-morbidities that require immediate attention? No   Clinician description of patient physical appearance/behavior: Pt is disheveled and ambulating with a walker, stating she has a history of a stroke. She is alert and oriented x4. Pt speaks in a garbled tone, at moderate volume and normal pace. Motor behavior appears sloweed. Eye contact is good. Pt's mood is depressed and affect is congruent with mood. Thought process is coherent and relevant. There is no indication from Pt's behavior that she is currently responding to internal stimuli or experiencing delusional thought content. She is cooperative and asking for mental health treatment.  What Do You Feel Would Help You the Most Today? Treatment for Depression or other mood problem;Medication(s)  If access to [facility] was not available, would you have sought care in the Emergency Department? Yes  Determination of Need Urgent (48 hours)  Options For Referral Inpatient Hospitalization;BH Urgent Care;Medication Management;Outpatient Therapy

## 2022-12-08 NOTE — ED Provider Notes (Signed)
Behavioral Health Urgent Care Medical Screening Exam  Patient Name: Kaitlyn Gray MRN: [id number] Date of Evaluation: 12/08/22 Chief Complaint:   Diagnosis:  Final diagnoses:  Adjustment disorder with disturbance of conduct  Noncompliance with medications    History of Present illness: Kaitlyn Gray is a 47 y.o. female.  Presents to [facility] urgent care with suicidal ideations.  States " I do not want to hurt myself and I am not suicidal now."  Patient reports she needs assistance with disability forms.  Denies that she has any income at this time.  States " I am making it day by day."  Reports food insecurities.  States she does not like to be alone.  She denied that she is followed by therapy or psychiatry currently.  States she was on a long-acting injectable Abilify because she often forgets to take her medications.  She reports hearing voices that tell her to do bad things. "  Sometimes they tell me to overdose on my medications."  Patient reports she was recently discharged from [facility] 2 weeks prior. "  I asked to leave because I did not like the facility."   Patient can be difficult to understand due to history with CVA.  She reports she had 3 strokes 2016-2017.  Patient recently was discharged from [facility] emergency department due to unsteady gait and falling at home early this morning.      Patient was offered inpatient admission at this time.  She states " I cannot I am going to lose my place to have to go talk to my landlord."  -Patient states "I cannot stay here because I want have my own room. Patient then stated that she is no longer suicidal. Stated." But I am hungry right now."   Plan: Initially recommended inpatient admission-however patient declined.  patient was offered to follow-up at the [facility] clinic and consider partial hospitalization programming and/or intensive outpatient programming.  CSW to follow-up with day program resources, such as [facility] and  [facility] program.   During evaluation Kaitlyn Gray is sitting; she is alert/oriented x 4; calm/cooperative; and mood congruent with affect.   and normal pace; with minimal eye contact.   Patients' thought process is coherent and relevant; There is no indication that she is currently responding to internal/external stimuli or experiencing delusional thought content.  Patient denies homicidal ideation, psychosis, and paranoia.  Patient has remained calm throughout assessment and has answered questions appropriately.    Flowsheet Row ED from 12/07/2022 in [facility] Emergency Department at [facility] ED from 12/05/2022 in [facility] Emergency Department at [facility] ED from 11/20/2022 in [facility] Emergency Department at [facility]  C-SSRS RISK CATEGORY No Risk No Risk No Risk       Psychiatric Specialty Exam  Presentation  General Appearance:Disheveled  Eye Contact:Fair  Speech:Clear and Coherent  Speech Volume:Normal  Handedness:Right   Mood and Affect  Mood: Irritable; Anxious  Affect: Appropriate   Thought Process  Thought Processes: Coherent  Descriptions of Associations:Intact  Orientation:Full (Time, Place and Person)  Thought Content:Logical  Diagnosis of Schizophrenia or Schizoaffective disorder in past: No  Duration of Psychotic Symptoms: Greater than six months  Hallucinations:None  Ideas of Reference:None  Suicidal Thoughts:Yes, Passive Without Intent; Without Plan  Homicidal Thoughts:No   Sensorium  Memory: Recent Good; Immediate Good; Remote Good  Judgment: Good  Insight: Good   Executive Functions  Concentration: Fair  Attention Span: Fair  Recall: Fair  Fund of Knowledge: Fair  Language: Good   Psychomotor Activity  Psychomotor Activity: Other (comment) (unsteady. patient use a walker for ambulation assistance)   Assets  Assets: Housing; Financial Resources/Insurance   Sleep   Sleep: Poor  Number of hours: 5 Physical Exam: Physical Exam Vitals and nursing note reviewed.  Cardiovascular:     Rate and Rhythm: Normal rate and regular rhythm.  Neurological:     Mental Status: She is alert and oriented to person, place, and time.    Review of Systems  Psychiatric/Behavioral:  Positive for suicidal ideas (passive ideations inital, then reported " if  i was going to do it" i would  overdose.).   All other systems reviewed and are negative.  Blood pressure (!) 155/80, pulse 68, temperature 98.1 F (36.7 C), temperature source Oral, resp. rate 18, SpO2 100 %. There is no height or weight on file to calculate BMI.  Musculoskeletal: Strength & Muscle Tone: within normal limits Gait & Station: unsteady Patient leans: N/A   [facility] MSE Discharge Disposition for Follow up and Recommendations: Based on my evaluation the patient does not appear to have an emergency medical condition and can be discharged with resources and follow up care in outpatient services for [facility]    Oneta Rack, NP 12/08/2022, 5:07 PM

## 2022-12-08 NOTE — ED Notes (Signed)
RN to room to discharge pt , pt refusing to leave, pt reports she will pee on the floor. Pt noted to be urinating on the floor instead of ambulating to bathroom, pt then again started to yell, security was called, pt escorted out of ED, pt noted to be hitting herself, security maintained safety of pt and staff.

## 2022-12-11 ENCOUNTER — Ambulatory Visit (HOSPITAL_COMMUNITY): Payer: Medicaid Other

## 2022-12-11 ENCOUNTER — Encounter (HOSPITAL_COMMUNITY): Payer: Self-pay

## 2022-12-11 ENCOUNTER — Telehealth (HOSPITAL_COMMUNITY): Payer: Medicaid Other

## 2022-12-11 NOTE — Progress Notes (Unsigned)
Psychiatric Initial Adult Assessment  Virtual Visit via Video Note  I connected with Kaitlyn Gray on 12/11/22 at 11:00 AM EDT by a video enabled telemedicine application and verified that I am speaking with the correct person using two identifiers.  Location: Patient: Home Provider: Clinic   I discussed the limitations of evaluation and management by telemedicine and the availability of in person appointments. The patient expressed understanding and agreed to proceed.  I provided 45 minutes of non-face-to-face time during this encounter.   Patient Identification: Kaitlyn Gray MRN:  [id number] Date of Evaluation:  12/11/2022 Referral Source: *** Chief Complaint:  No chief complaint on file.  Visit Diagnosis: No diagnosis found.  History of Present Illness:  ***  Associated Signs/Symptoms: Depression Symptoms:  {DEPRESSION SYMPTOMS:20000} (Hypo) Manic Symptoms:  {BHH MANIC SYMPTOMS:22872} Anxiety Symptoms:  {BHH ANXIETY SYMPTOMS:22873} Psychotic Symptoms:  {BHH PSYCHOTIC SYMPTOMS:22874} PTSD Symptoms: {BHH PTSD SYMPTOMS:22875}  Past Psychiatric History: ***  Previous Psychotropic Medications: {YES/NO:21197}  Substance Abuse History in the last 12 months:  {yes no:314532}  Consequences of Substance Abuse: {BHH CONSEQUENCES OF SUBSTANCE ABUSE:22880}  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) 07/19/2017   Hypertension     Hypertensive emergency 05/31/2022   Ischemic stroke (HCC) 01/01/2020   subacute right middle cerebellar peduncle and pons infarction   Left-sided weakness 01/27/2022   Head CT with remote right occipital infarct which is consistent with old left-sided weakness   MDD (major depressive disorder), recurrent severe, 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   Normocytic anemia 02/09/2020   Opiate use    History of Suboxone Therapy until 05/2021   Polysubstance abuse (HCC) 07/19/2017   Prescription drug misadventures (Seroquel)     Past Surgical History:  Procedure Laterality Date   CESAREAN SECTION      Family Psychiatric History: ***  Family History:  Family History  Problem Relation Age of Onset   Hypertension Mother    CAD Mother 34       died of MI at age 12   Hypertension Father     Social History:   Social History   Socioeconomic History   Marital status: Single    Spouse name: Not on file   Number of children: Not on file   Years of education: Not on file   Highest education level: Not on file  Occupational History   Not on file  Tobacco Use   Smoking status: Every Day    Packs/day: .5    Types: Cigarettes   Smokeless tobacco: Never  Vaping Use   Vaping Use: Never used  Substance and Sexual Activity   Alcohol use: No   Drug use: Yes    Types: Marijuana, Cocaine   Sexual activity: Not on file  Other Topics Concern   Not on file  Social History Narrative   Not on file   Social Determinants of Health   Financial Resource Strain: Not on file  Food Insecurity: Food Insecurity  Present (08/04/2022)   Hunger Vital Sign    Worried About Running Out of Food in the Last Year: Sometimes true    Ran Out of Food in the Last Year: Sometimes true  Transportation Needs: Unmet Transportation Needs (08/04/2022)   PRAPARE - Administrator, Civil Service (Medical): Yes    Lack of Transportation (Non-Medical): Yes  Physical Activity: Not on file  Stress: Not on file   Social Connections: Not on file    Additional Social History: ***  Allergies:   Allergies  Allergen Reactions   Beef-Derived Products     Pt does not eat red meat   Hydrocodone Itching   Latex Itching and Rash    Metabolic Disorder Labs: Lab Results  Component Value Date   HGBA1C 6.4 (H) 11/24/2022   MPG 136.98 11/24/2022   MPG 142.72 03/30/2022   No results found for: "PROLACTIN" Lab Results  Component Value Date   CHOL 151 04/01/2022   TRIG 151 (H) 04/01/2022   HDL 44 04/01/2022   CHOLHDL 3.4 04/01/2022   VLDL 30 04/01/2022   LDLCALC 77 04/01/2022   LDLCALC 102 (H) 09/07/2020   Lab Results  Component Value Date   TSH 0.955 08/04/2022    Therapeutic Level Labs: No results found for: "LITHIUM" No results found for: "CBMZ" Lab Results  Component Value Date   VALPROATE <10 (L) 07/19/2017    Current Medications: Current Outpatient Medications  Medication Sig Dispense Refill   amLODipine (NORVASC) 10 MG tablet Take 1 tablet (10 mg total) by mouth daily. 30 tablet 2   aspirin EC 81 MG tablet Take 81 mg by mouth daily. Swallow whole.     ferrous sulfate 325 (65 FE) MG tablet Take 1 tablet (325 mg total) by mouth every other day. 15 tablet 2   gabapentin (NEURONTIN) 100 MG capsule Take 1 capsule (100 mg total) by mouth 3 (three) times daily for pain scale 4-7. 45 capsule 1   gabapentin (NEURONTIN) 300 MG capsule Take 1 capsule (300 mg total) by mouth at bedtime. 30 capsule 2   losartan (COZAAR) 100 MG tablet Take 1 tablet (100 mg total) by mouth daily. 30 tablet 2   metFORMIN (GLUCOPHAGE) 500 MG tablet Take 1 tablet (500 mg total) by mouth 2 (two) times daily with a meal. 60 tablet 2   nicotine polacrilex (NICORETTE) 2 MG gum Take 1 each (2 mg total) by mouth every 2 (two) hours as needed for Smoking Cravings 180 each 1   pantoprazole (PROTONIX) 40 MG tablet Take 1 tablet (40 mg total) by mouth daily. 30 tablet 2   PARoxetine (PAXIL) 20 MG tablet Take 1 tablet (20  mg total) by mouth daily for depression. 15 tablet 1   QUEtiapine (SEROQUEL) 200 MG tablet Take 1 tablet (200 mg total) by mouth at bedtime. 30 tablet 2   QUEtiapine (SEROQUEL) 200 MG tablet Take 1 tablet (200 mg total) by mouth at bedtime for mood 15 tablet 1   rosuvastatin (CRESTOR) 40 MG tablet Take 1 tablet (40 mg total) by mouth daily. 30 tablet 2   No current facility-administered medications for this visit.    Musculoskeletal: Strength & Muscle Tone: {desc; muscle tone:32375} Gait & Station: {PE GAIT ED VOZD:66440} Patient leans: {Patient Leans:21022755}  Psychiatric Specialty Exam: Review of Systems  There were no vitals taken for this visit.There is no height or weight on file to calculate BMI.  General Appearance: {Appearance:22683}  Eye Contact:  {BHH EYE CONTACT:22684}  Speech:  {Speech:22685}  Volume:  {Volume (PAA):22686}  Mood:  {BHH MOOD:22306}  Affect:  {Affect (PAA):22687}  Thought Process:  {Thought Process (PAA):22688}  Orientation:  {BHH ORIENTATION (PAA):22689}  Thought Content:  {Thought Content:22690}  Suicidal Thoughts:  {ST/HT (PAA):22692}  Homicidal Thoughts:  {ST/HT (PAA):22692}  Memory:  {BHH MEMORY:22881}  Judgement:  {Judgement (PAA):22694}  Insight:  {Insight (PAA):22695}  Psychomotor Activity:  {Psychomotor (PAA):22696}  Concentration:  {Concentration:21399}  Recall:  {BHH GOOD/FAIR/POOR:22877}  Fund of Knowledge:{BHH GOOD/FAIR/POOR:22877}  Language: {BHH GOOD/FAIR/POOR:22877}  Akathisia:  {BHH YES OR NO:22294}  Handed:  {Handed:22697}  AIMS (if indicated):  {Desc; done/not:10129}  Assets:  {Assets (PAA):22698}  ADL's:  {BHH NWG'N:56213}  Cognition: {chl bhh cognition:304700322}  Sleep:  {BHH GOOD/FAIR/POOR:22877}   Screenings: AIMS    Flowsheet [facility]) [facility]  AIMS Total Score 0      AUDIT    Flowsheet Row Ad[facility]) [facility] Ad[facility] ([facility]  Alcohol Use Disorder Identification Test Final Score (AUDIT) 0 0      CAGE-AID    Flowsheet Row ED from 10/16/2021 in [facility] Emergency Department at [facility] ED to Hosp-Admission (Discharged) from 09/06/2020 in [facility] ED to Hosp-Admission (Discharged) from 02/08/2020 in [facility] ED to Hosp-Admission (Discharged) from 01/01/2020 in [facility]  CAGE-AID Score 1 1 0 1      GAD-7    Flowsheet Row Office Visit from 07/17/2022 in [facility] at [facility]  Total GAD-7 Score 7      PHQ2-9    Flowsheet Row Office Visit from 07/17/2022 in [facility] at [facility] ED from 12/12/2021 in [facility] Emergency Department at [facility] from 07/15/2017 in [place] [facility]  PHQ-2 Total Score 2 2 4   PHQ-9 Total Score 21 12 10       Flowsheet Row ED from 12/07/2022 in [facility] Emergency Department at [facility] ED from 12/05/2022 in [facility] Emergency Department at [facility] ED from 11/20/2022 in [facility] Emergency Department at [facility]  C-SSRS RISK CATEGORY No Risk No Risk No Risk       Assessment and Plan: ***  Collaboration of Care: [facility] OP Collaboration of YQMV:[id number]}  Patient/Guardian was advised Release of Information must be obtained prior to any record release in order to collaborate their care with an outside provider. Patient/Guardian was advised if they have not already done so to contact the registration department to sign all necessary forms in order for [place] to release information regarding their care.   Consent: Patient/Guardian gives verbal consent for treatment and assignment of benefits for services provided during this visit.  Patient/Guardian expressed understanding and agreed to proceed.   Shanna Cisco, NP 6/27/202411:18 AM

## 2022-12-12 ENCOUNTER — Emergency Department (HOSPITAL_COMMUNITY)
Admission: EM | Admit: 2022-12-12 | Discharge: 2022-12-12 | Disposition: A | Discharge status: Home / Self Care | Payer: Medicaid Other | Attending: Emergency Medicine | Admitting: Emergency Medicine

## 2022-12-12 ENCOUNTER — Encounter (HOSPITAL_COMMUNITY): Payer: Self-pay

## 2022-12-12 ENCOUNTER — Other Ambulatory Visit: Payer: Self-pay

## 2022-12-12 DIAGNOSIS — R1084 Generalized abdominal pain: Secondary | ICD-10-CM | POA: Diagnosis not present

## 2022-12-12 DIAGNOSIS — Z91040 Latex allergy status: Secondary | ICD-10-CM | POA: Diagnosis not present

## 2022-12-12 DIAGNOSIS — R109 Unspecified abdominal pain: Secondary | ICD-10-CM | POA: Diagnosis present

## 2022-12-12 DIAGNOSIS — Z7982 Long term (current) use of aspirin: Secondary | ICD-10-CM | POA: Diagnosis not present

## 2022-12-12 LAB — CBC WITH DIFFERENTIAL/PLATELET
Abs Immature Granulocytes: 0.01 10*3/uL (ref 0.00–0.07)
Basophils Absolute: 0 10*3/uL (ref 0.0–0.1)
Basophils Relative: 1 %
Eosinophils Absolute: 0 10*3/uL (ref 0.0–0.5)
Eosinophils Relative: 1 %
HCT: 28.2 % — ABNORMAL LOW (ref 36.0–46.0)
Hemoglobin: 8.2 g/dL — ABNORMAL LOW (ref 12.0–15.0)
Immature Granulocytes: 0 %
Lymphocytes Relative: 28 %
Lymphs Abs: 1.3 10*3/uL (ref 0.7–4.0)
MCH: 22 pg — ABNORMAL LOW (ref 26.0–34.0)
MCHC: 29.1 g/dL — ABNORMAL LOW (ref 30.0–36.0)
MCV: 75.8 fL — ABNORMAL LOW (ref 80.0–100.0)
Monocytes Absolute: 0.4 10*3/uL (ref 0.1–1.0)
Monocytes Relative: 8 %
Neutro Abs: 2.9 10*3/uL (ref 1.7–7.7)
Neutrophils Relative %: 62 %
Platelets: 379 10*3/uL (ref 150–400)
RBC: 3.72 MIL/uL — ABNORMAL LOW (ref 3.87–5.11)
RDW: 20.6 % — ABNORMAL HIGH (ref 11.5–15.5)
WBC: 4.6 10*3/uL (ref 4.0–10.5)
nRBC: 0 % (ref 0.0–0.2)

## 2022-12-12 LAB — COMPREHENSIVE METABOLIC PANEL
ALT: 13 U/L (ref 0–44)
AST: 14 U/L — ABNORMAL LOW (ref 15–41)
Albumin: 3.4 g/dL — ABNORMAL LOW (ref 3.5–5.0)
Alkaline Phosphatase: 70 U/L (ref 38–126)
Anion gap: 10 (ref 5–15)
BUN: 14 mg/dL (ref 6–20)
CO2: 22 mmol/L (ref 22–32)
Calcium: 8.7 mg/dL — ABNORMAL LOW (ref 8.9–10.3)
Chloride: 107 mmol/L (ref 98–111)
Creatinine, Ser: 0.95 mg/dL (ref 0.44–1.00)
GFR, Estimated: >60 mL/min (ref >60)
Glucose, Bld: 98 mg/dL (ref 70–99)
Potassium: 3.4 mmol/L — ABNORMAL LOW (ref 3.5–5.1)
Sodium: 139 mmol/L (ref 135–145)
Total Bilirubin: 0.3 mg/dL (ref 0.3–1.2)
Total Protein: 7.3 g/dL (ref 6.5–8.1)

## 2022-12-12 LAB — LIPASE, BLOOD: Lipase: 39 U/L (ref 11–51)

## 2022-12-12 NOTE — ED Triage Notes (Signed)
PT BIB EMS states was called by patient for abdominal pain and said she couldn't walk, patient was on the porch waiting on them. Pt uncooperative with giving additional information and name.  Pt speaks very child like, yelling she wants to drink water, walked to the sink and started drinking water from the sink.   Smells of urine.   160/90 78  96% RA  CBG 105 Temp 97.3

## 2022-12-12 NOTE — ED Notes (Signed)
Pt sitting in bed eating food and watching videos on phone. Pt denies any complaints at this time.

## 2022-12-12 NOTE — ED Notes (Signed)
Pt eating and drinking without difficulty.  

## 2022-12-12 NOTE — ED Provider Notes (Signed)
[place] EMERGENCY DEPARTMENT AT [facility] Provider Note   CSN: [id number] Arrival date & time: 12/12/22  0845     History  Chief Complaint  Patient presents with   Abdominal Pain    Kaitlyn Gray is a 47 y.o. female.   Abdominal Pain Patient presents with several complaints.  Reportedly abdominal pain and may be difficulty walking.  However patient has impaired speech from previous stroke but also reviewing notes appears to have psychiatric issues which will affect her speech.  Recently seen under different MRN, although chart has not been merged yet.  [id number]   Complain of headache difficulty speaking no abdominal pain.    Past history of previous strokes and substance use along with psychiatric disorders.  Home Medications Prior to Admission medications   Medication Sig Start Date End Date Taking? Authorizing Provider  amLODipine (NORVASC) 10 MG tablet Take 1 tablet (10 mg total) by mouth daily. 06/04/22 11/20/22  [place], Alvira Philips, DO  aspirin EC 81 MG tablet Take 81 mg by mouth daily. Swallow whole.    [provider]  ferrous sulfate 325 (65 FE) MG tablet Take 1 tablet (325 mg total) by mouth every other day. 06/04/22 11/20/22  [place], Alvira Philips, DO  gabapentin (NEURONTIN) 100 MG capsule Take 1 capsule (100 mg total) by mouth 3 (three) times daily for pain scale 4-7. 12/01/22     gabapentin (NEURONTIN) 300 MG capsule Take 1 capsule (300 mg total) by mouth at bedtime. 06/04/22 11/20/22  [place], Alvira Philips, DO  losartan (COZAAR) 100 MG tablet Take 1 tablet (100 mg total) by mouth daily. 06/04/22 11/20/22  [place], Alvira Philips, DO  metFORMIN (GLUCOPHAGE) 500 MG tablet Take 1 tablet (500 mg total) by mouth 2 (two) times daily with a meal. 06/04/22 11/20/22  [place], Alvira Philips, DO  nicotine polacrilex (NICORETTE) 2 MG gum Take 1 each (2 mg total) by mouth every 2 (two) hours as needed for Smoking Cravings 12/01/22     pantoprazole (PROTONIX) 40 MG tablet Take 1 tablet (40 mg  total) by mouth daily. 06/04/22 11/20/22  [place], Alvira Philips, DO  PARoxetine (PAXIL) 20 MG tablet Take 1 tablet (20 mg total) by mouth daily for depression. 12/01/22     QUEtiapine (SEROQUEL) 200 MG tablet Take 1 tablet (200 mg total) by mouth at bedtime. 06/04/22 11/20/22  [place], Alvira Philips, DO  QUEtiapine (SEROQUEL) 200 MG tablet Take 1 tablet (200 mg total) by mouth at bedtime for mood 12/01/22     rosuvastatin (CRESTOR) 40 MG tablet Take 1 tablet (40 mg total) by mouth daily. 06/04/22 11/20/22  [place], Eric J, DO      Allergies    Beef-derived products, Hydrocodone, and Latex    Review of Systems   Review of Systems  Gastrointestinal:  Positive for abdominal pain.    Physical Exam Updated Vital Signs BP (!) 146/82 (BP Location: Right Arm)   Pulse 73   Temp 97.8 F (36.6 C) (Oral)   Resp 18   SpO2 100%  Physical Exam Vitals and nursing note reviewed.  Eyes:     Pupils: Pupils are equal, round, and reactive to light.  Pulmonary:     Breath sounds: Normal breath sounds.  Abdominal:     Tenderness: There is no abdominal tenderness.  Neurological:     Mental Status: She is alert.     Comments: Somewhat slurred speech.  Difficult to understand.  Some baby talk.  Reviewing notes has had several different  visits for the same including recent MRI that was negative with similar episode.     ED Results / Procedures / Treatments   Labs (all labs ordered are listed, but only abnormal results are displayed) Labs Reviewed  COMPREHENSIVE METABOLIC PANEL - Abnormal; Notable for the following components:      Result Value   Potassium 3.4 (*)    Calcium 8.7 (*)    Albumin 3.4 (*)    AST 14 (*)    All other components within normal limits  CBC WITH DIFFERENTIAL/PLATELET - Abnormal; Notable for the following components:   RBC 3.72 (*)    Hemoglobin 8.2 (*)    HCT 28.2 (*)    MCV 75.8 (*)    MCH 22.0 (*)    MCHC 29.1 (*)    RDW 20.6 (*)    All other components within normal limits  LIPASE,  BLOOD  PREGNANCY, URINE  URINALYSIS, W/ REFLEX TO CULTURE (INFECTION SUSPECTED)  RAPID URINE DRUG SCREEN, HOSP PERFORMED    EKG None  Radiology No results found.  Procedures Procedures    Medications Ordered in ED Medications - No data to display  ED Course/ Medical Decision Making/ A&P                             Medical Decision Making Amount and/or Complexity of Data Reviewed Labs: ordered.   Patient with reported abdominal pain.  Maybe mental status change.  Reviewed notes under patient's other MRN and reviewed MRIs.  Similar visits for same.  Recent psychiatric visit and inpatient treatment recommended however patient was not willing to because she states she would lose her house.  Social determinants of health include her psychiatric disease her home and security, food insecurity, drug use.  Differential diagnose includes psychiatric disorders intra-abdominal pathology stroke felt less likely.  Will get basic blood work at this time.  Blood work reassuring.  Patient had been moved to the hallway.  Became belligerent and yelling.  Unwilling to stay for treatment.  States she hates going hospital.  States she needs a bus pass to go home.  Later refused bus pass.  Escorted out by security.  Likely a component of her underlying psychiatric disease, however inpatient treatment had recently been recommended but patient refused.  Do not think there is criteria for IVC at this time.        Final Clinical Impression(s) / ED Diagnoses Final diagnoses:  Generalized abdominal pain    Rx / DC Orders ED Discharge Orders     None         Benjiman Core, MD 12/12/22 1605

## 2022-12-12 NOTE — ED Notes (Signed)
Pt refusing bed alarm at this time for fall risk.

## 2022-12-12 NOTE — ED Notes (Signed)
Patient caused a scene after being move to a stretcher in the hallway to open an available room. Patient began cussing at staff stating "I cannot get better in the hallway". Refused to give urine sample. She accused staff of treating her like and animal and states "this is why I hate [facility], you never help people". RN repeatedly asked Charge RN Megan for assistance, given a bus pass but [name] declined to come and speak with patient. Security made aware of situation. Patient stated that "I am entitled to speak with the head nurse and I do not deserve to be treated this way". Primary nurse spoke with MD who stated that she could go, he would not be discharging her with any prescriptions. Patient refused to leave because she said that she could not walk and had no way home. Patient refused a bus pass from staff. Security talked with case management who said they would work to get her a cab pass. Patient belligerent with staff on way out. Cursed out nurse witnessed by Okey Dupre, Licensed conveyancer and multiple security guards. VS stable and patient ambulatory on way out.

## 2022-12-13 ENCOUNTER — Other Ambulatory Visit: Payer: Self-pay

## 2022-12-13 ENCOUNTER — Encounter (HOSPITAL_COMMUNITY): Payer: Self-pay | Admitting: *Deleted

## 2022-12-13 ENCOUNTER — Emergency Department (HOSPITAL_COMMUNITY)
Admission: EM | Admit: 2022-12-13 | Discharge: 2022-12-14 | Disposition: A | Discharge status: Home / Self Care | Payer: Medicaid Other | Attending: Emergency Medicine | Admitting: Emergency Medicine

## 2022-12-13 DIAGNOSIS — Z794 Long term (current) use of insulin: Secondary | ICD-10-CM | POA: Insufficient documentation

## 2022-12-13 DIAGNOSIS — Z7982 Long term (current) use of aspirin: Secondary | ICD-10-CM | POA: Insufficient documentation

## 2022-12-13 DIAGNOSIS — R45851 Suicidal ideations: Secondary | ICD-10-CM | POA: Insufficient documentation

## 2022-12-13 DIAGNOSIS — I10 Essential (primary) hypertension: Secondary | ICD-10-CM | POA: Insufficient documentation

## 2022-12-13 DIAGNOSIS — Z91040 Latex allergy status: Secondary | ICD-10-CM | POA: Insufficient documentation

## 2022-12-13 DIAGNOSIS — Z79899 Other long term (current) drug therapy: Secondary | ICD-10-CM | POA: Diagnosis not present

## 2022-12-13 DIAGNOSIS — Z8673 Personal history of transient ischemic attack (TIA), and cerebral infarction without residual deficits: Secondary | ICD-10-CM | POA: Insufficient documentation

## 2022-12-13 DIAGNOSIS — F333 Major depressive disorder, recurrent, severe with psychotic symptoms: Secondary | ICD-10-CM | POA: Diagnosis present

## 2022-12-13 DIAGNOSIS — Z7984 Long term (current) use of oral hypoglycemic drugs: Secondary | ICD-10-CM | POA: Insufficient documentation

## 2022-12-13 DIAGNOSIS — R440 Auditory hallucinations: Secondary | ICD-10-CM | POA: Diagnosis present

## 2022-12-13 DIAGNOSIS — Z87891 Personal history of nicotine dependence: Secondary | ICD-10-CM | POA: Diagnosis not present

## 2022-12-13 DIAGNOSIS — E119 Type 2 diabetes mellitus without complications: Secondary | ICD-10-CM | POA: Diagnosis not present

## 2022-12-13 LAB — CBC
HCT: 28.6 % — ABNORMAL LOW (ref 36.0–46.0)
Hemoglobin: 8.1 g/dL — ABNORMAL LOW (ref 12.0–15.0)
MCH: 21.6 pg — ABNORMAL LOW (ref 26.0–34.0)
MCHC: 28.3 g/dL — ABNORMAL LOW (ref 30.0–36.0)
MCV: 76.3 fL — ABNORMAL LOW (ref 80.0–100.0)
Platelets: 395 10*3/uL (ref 150–400)
RBC: 3.75 MIL/uL — ABNORMAL LOW (ref 3.87–5.11)
RDW: 20.7 % — ABNORMAL HIGH (ref 11.5–15.5)
WBC: 4.3 10*3/uL (ref 4.0–10.5)
nRBC: 0 % (ref 0.0–0.2)

## 2022-12-13 LAB — COMPREHENSIVE METABOLIC PANEL
ALT: 15 U/L (ref 0–44)
AST: 16 U/L (ref 15–41)
Albumin: 3.3 g/dL — ABNORMAL LOW (ref 3.5–5.0)
Alkaline Phosphatase: 67 U/L (ref 38–126)
Anion gap: 7 (ref 5–15)
BUN: 12 mg/dL (ref 6–20)
CO2: 23 mmol/L (ref 22–32)
Calcium: 8.7 mg/dL — ABNORMAL LOW (ref 8.9–10.3)
Chloride: 110 mmol/L (ref 98–111)
Creatinine, Ser: 0.82 mg/dL (ref 0.44–1.00)
GFR, Estimated: >60 mL/min (ref >60)
Glucose, Bld: 105 mg/dL — ABNORMAL HIGH (ref 70–99)
Potassium: 3.1 mmol/L — ABNORMAL LOW (ref 3.5–5.1)
Sodium: 140 mmol/L (ref 135–145)
Total Bilirubin: 0.6 mg/dL (ref 0.3–1.2)
Total Protein: 6.9 g/dL (ref 6.5–8.1)

## 2022-12-13 LAB — CBG MONITORING, ED
Glucose-Capillary: 102 mg/dL — ABNORMAL HIGH (ref 70–99)
Glucose-Capillary: 112 mg/dL — ABNORMAL HIGH (ref 70–99)
Glucose-Capillary: 109 mg/dL — ABNORMAL HIGH (ref 70–99)

## 2022-12-13 LAB — HCG, SERUM, QUALITATIVE: Preg, Serum: NEGATIVE

## 2022-12-13 LAB — SALICYLATE LEVEL: Salicylate Lvl: <7 mg/dL — ABNORMAL LOW (ref 7.0–30.0)

## 2022-12-13 LAB — RAPID URINE DRUG SCREEN, HOSP PERFORMED
Amphetamines: NOT DETECTED
Barbiturates: NOT DETECTED
Benzodiazepines: NOT DETECTED
Cocaine: POSITIVE — AB
Opiates: NOT DETECTED
Tetrahydrocannabinol: POSITIVE — AB

## 2022-12-13 LAB — ACETAMINOPHEN LEVEL: Acetaminophen (Tylenol), Serum: <10 ug/mL — ABNORMAL LOW (ref 10–30)

## 2022-12-13 LAB — ETHANOL: Alcohol, Ethyl (B): <10 mg/dL (ref <10)

## 2022-12-13 MED ORDER — AMLODIPINE BESYLATE 5 MG PO TABS
10.0000 mg | ORAL_TABLET | Freq: Every day | ORAL | Status: DC
  Administered 2022-12-13 – 2022-12-14 (×2): 10 mg via ORAL
  Filled 2022-12-13 (×2): qty 2

## 2022-12-13 MED ORDER — METFORMIN HCL 500 MG PO TABS
500.0000 mg | ORAL_TABLET | Freq: Two times a day (BID) | ORAL | Status: DC
  Administered 2022-12-13 – 2022-12-14 (×3): 500 mg via ORAL
  Filled 2022-12-13 (×3): qty 1

## 2022-12-13 MED ORDER — ROSUVASTATIN CALCIUM 5 MG PO TABS
40.0000 mg | ORAL_TABLET | Freq: Every day | ORAL | Status: DC
  Administered 2022-12-13 – 2022-12-14 (×2): 40 mg via ORAL
  Filled 2022-12-13: qty 8
  Filled 2022-12-13: qty 2

## 2022-12-13 MED ORDER — GABAPENTIN 100 MG PO CAPS
100.0000 mg | ORAL_CAPSULE | Freq: Three times a day (TID) | ORAL | Status: DC
  Administered 2022-12-13 – 2022-12-14 (×4): 100 mg via ORAL
  Filled 2022-12-13 (×4): qty 1

## 2022-12-13 MED ORDER — QUETIAPINE FUMARATE 200 MG PO TABS
200.0000 mg | ORAL_TABLET | Freq: Every day | ORAL | Status: DC
  Administered 2022-12-13: 200 mg via ORAL
  Filled 2022-12-13: qty 1

## 2022-12-13 MED ORDER — LORAZEPAM 1 MG PO TABS: 1.0000 mg | ORAL_TABLET | Freq: Three times a day (TID) | ORAL | Status: DC | PRN

## 2022-12-13 MED ORDER — INSULIN ASPART 100 UNIT/ML IJ SOLN: 0.0000 [IU] | Freq: Every day | INTRAMUSCULAR | Status: DC

## 2022-12-13 MED ORDER — NICOTINE 21 MG/24HR TD PT24
21.0000 mg | MEDICATED_PATCH | Freq: Once | TRANSDERMAL | Status: DC
  Filled 2022-12-13: qty 1

## 2022-12-13 MED ORDER — INSULIN ASPART 100 UNIT/ML IJ SOLN: 0.0000 [IU] | Freq: Three times a day (TID) | INTRAMUSCULAR | Status: DC

## 2022-12-13 MED ORDER — ASPIRIN 81 MG PO TBEC
81.0000 mg | DELAYED_RELEASE_TABLET | Freq: Every day | ORAL | Status: DC
  Administered 2022-12-13 – 2022-12-14 (×2): 81 mg via ORAL
  Filled 2022-12-13 (×2): qty 1

## 2022-12-13 MED ORDER — ARIPIPRAZOLE 5 MG PO TABS: 5.0000 mg | ORAL_TABLET | Freq: Every day | ORAL | Status: DC

## 2022-12-13 MED ORDER — PANTOPRAZOLE SODIUM 40 MG PO TBEC
40.0000 mg | DELAYED_RELEASE_TABLET | Freq: Every day | ORAL | Status: DC
  Administered 2022-12-13 – 2022-12-14 (×2): 40 mg via ORAL
  Filled 2022-12-13 (×2): qty 1

## 2022-12-13 MED ORDER — PAROXETINE HCL 20 MG PO TABS
20.0000 mg | ORAL_TABLET | Freq: Every day | ORAL | Status: DC
  Administered 2022-12-13 – 2022-12-14 (×2): 20 mg via ORAL
  Filled 2022-12-13 (×2): qty 1

## 2022-12-13 MED ORDER — LOSARTAN POTASSIUM 50 MG PO TABS
100.0000 mg | ORAL_TABLET | Freq: Every day | ORAL | Status: DC
  Administered 2022-12-13 – 2022-12-14 (×2): 100 mg via ORAL
  Filled 2022-12-13 (×2): qty 2

## 2022-12-13 MED ORDER — OLANZAPINE 5 MG PO TBDP
5.0000 mg | ORAL_TABLET | Freq: Three times a day (TID) | ORAL | Status: DC | PRN
  Administered 2022-12-13: 5 mg via ORAL
  Filled 2022-12-13: qty 1

## 2022-12-13 MED ORDER — POTASSIUM CHLORIDE CRYS ER 20 MEQ PO TBCR
40.0000 meq | EXTENDED_RELEASE_TABLET | Freq: Once | ORAL | Status: AC
  Administered 2022-12-13: 40 meq via ORAL
  Filled 2022-12-13: qty 2

## 2022-12-13 NOTE — ED Notes (Signed)
PT found wandering the halls talking about a train coming and wanting to lay down on the tracks in front of the train.    This RN redirected her back to her bed and gave her Zyprexa along with her metformin.  Pt refused to place under tongue so she swallowed the Zyprexa.   PT ate breakfast and is having a [place] sandwich as well.

## 2022-12-13 NOTE — ED Triage Notes (Signed)
The pt is not  answering questions  she appears to be talking to someone

## 2022-12-13 NOTE — ED Notes (Signed)
Pt not cooperative. Had to bribe pt w/ ice cream to take her meds. Pt w/ manipulative behaviors towards staff. Pt was faking asleep ignoring staff when RN initially went into the room.

## 2022-12-13 NOTE — BH Assessment (Signed)
Disposition:  [facility] Consult Note advises the following: Arrange for outpatient psychiatric care post-discharge from inpatient psychiatric admission.  [facility] is currently unable to review or accept the patient for admission. Therefore, this clinician referred patient to the following facilities for potential bed placement:  Destination  Service Provider Request Status Selected Services Address Phone Fax Patient Preferred  [facility]  Pending - No Request Sent N/A [address] [place] [address] [phone] --  [facility]  Pending - No Request Sent N/A [address], [place] [address] [phone] --  [facility]  Pending - No Request Sent N/A [address] [place] [address] [phone] --  [facility]  Pending - No Request Sent N/A [address] [place] [address] [phone] --  [facility]  Pending - No Request Sent N/A [address] [place], [place] [address] [phone] --  [facility]  Pending - No Request Sent N/A [address] [place] [address] [phone] --  [facility]  Pending - No Request Sent N/A [address], [place] [address] [phone] --  [facility]  Pending - No Request Sent N/A [address] [place] [address] [phone] --  [facility]  Pending - No Request Sent N/A [address] [place] [address] [phone] --  [facility]  Pending - No Request Sent N/A [address] [place] [address] [phone] --  [facility]  Pending - No Request Sent N/A [address], [place] [address] [phone] --  [facility]  Pending - No Request Sent N/A [address] [place]  [address] [phone] --  [facility]  Pending - No Request Sent N/A [address]., [place] [address] [phone] --  [facility]  Pending - No Request Sent N/A [address] [place] [address] [phone] --  [facility]  Pending - No Request Sent N/A [address] [place] [address] [phone] --  [facility]  Pending - No Request Sent N/A [address] [place] [address] [phone] --  [facility]  Pending - No Request Sent N/A [address], [place] [address] [phone] --  [facility]  Pending - No Request Sent N/A [address] [place] [address] [phone] --  [facility]  Pending - No Request Sent N/A [address] [place] [address] [phone] --  [facility]  Pending - No Request Sent N/A [address] [place], [place] [address] [phone] --  [facility]  Pending - No Request Sent N/A [address] [place] [address] [phone] --  [facility]  Pending - No Request [facility] [address] [place] [address] [phone] --

## 2022-12-13 NOTE — ED Notes (Signed)
Sitter at the bedside.

## 2022-12-13 NOTE — BH Assessment (Signed)
Contacted Maudry Diego with [facility] to request tele-psychiatry consult. Dr. Truman Hayward is scheduled to assess Pt at approximately 0520. Created secure message with Dr Jaci Carrel, Francene Castle, RN, Dr Truman Hayward, and Maudry Diego to coordinate consult.   Pamalee Leyden, [facility], [facility] Triage Specialist

## 2022-12-13 NOTE — ED Notes (Signed)
Pt wanded.  Belongings placed in locker 8 in purple zone.  Valuables placed in security.  Security paperwork is with belongings slip in pocket of locker

## 2022-12-13 NOTE — ED Notes (Signed)
Pt asking for special meal tray. Explained to pt that we do house trays only per their diet order. Updated diet order d/t pt diabetic and needs carb modified diet instead of regular. Pt also says she doesn't want to go to [facility]. Explained to pt that when they are faxed out, patients end up going to which ever facility responds w/ placement first. Pt kept saying she doesn't want to go to [facility] or go far. Pt said that she would follow-up outpatient if she has to go far. Updated BH team.

## 2022-12-13 NOTE — BH Assessment (Signed)
Received message from Maudry Diego stating: "This patient will now be seen by Norval Morton at 06:30."   Pamalee Leyden, [facility], [facility] Triage Specialist

## 2022-12-13 NOTE — ED Provider Notes (Signed)
[place] EMERGENCY DEPARTMENT AT [facility] Provider Note   CSN: [id number] Arrival date & time: 12/13/22  0112     History  No chief complaint on file.   Kaitlyn Gray is a 47 y.o. female.  Crisis worker called police to bring patient to the emergency department because she was behaving strangely.  Patient reports that there are people on the wall talking to her.  They are telling her to go sleep on the train tracks.  Patient reports "I do not want to live anymore".       Home Medications Prior to Admission medications   Medication Sig Start Date End Date Taking? Authorizing Provider  amLODipine (NORVASC) 10 MG tablet Take 1 tablet (10 mg total) by mouth daily. 06/04/22 11/20/22  [place], Alvira Philips, DO  aspirin EC 81 MG tablet Take 81 mg by mouth daily. Swallow whole.    [provider]  ferrous sulfate 325 (65 FE) MG tablet Take 1 tablet (325 mg total) by mouth every other day. 06/04/22 11/20/22  [place], Alvira Philips, DO  gabapentin (NEURONTIN) 100 MG capsule Take 1 capsule (100 mg total) by mouth 3 (three) times daily for pain scale 4-7. 12/01/22     gabapentin (NEURONTIN) 300 MG capsule Take 1 capsule (300 mg total) by mouth at bedtime. 06/04/22 11/20/22  [place], Alvira Philips, DO  losartan (COZAAR) 100 MG tablet Take 1 tablet (100 mg total) by mouth daily. 06/04/22 11/20/22  [place], Alvira Philips, DO  metFORMIN (GLUCOPHAGE) 500 MG tablet Take 1 tablet (500 mg total) by mouth 2 (two) times daily with a meal. 06/04/22 11/20/22  [place], Alvira Philips, DO  nicotine polacrilex (NICORETTE) 2 MG gum Take 1 each (2 mg total) by mouth every 2 (two) hours as needed for Smoking Cravings 12/01/22     pantoprazole (PROTONIX) 40 MG tablet Take 1 tablet (40 mg total) by mouth daily. 06/04/22 11/20/22  [place], Alvira Philips, DO  PARoxetine (PAXIL) 20 MG tablet Take 1 tablet (20 mg total) by mouth daily for depression. 12/01/22     QUEtiapine (SEROQUEL) 200 MG tablet Take 1 tablet (200 mg total) by mouth at  bedtime. 06/04/22 11/20/22  [place], Alvira Philips, DO  QUEtiapine (SEROQUEL) 200 MG tablet Take 1 tablet (200 mg total) by mouth at bedtime for mood 12/01/22     rosuvastatin (CRESTOR) 40 MG tablet Take 1 tablet (40 mg total) by mouth daily. 06/04/22 11/20/22  [place], Eric J, DO      Allergies    Beef-derived products, Hydrocodone, and Latex    Review of Systems   Review of Systems  Physical Exam Updated Vital Signs BP (!) 151/95 (BP Location: Right Arm)   Pulse 62   Temp 97.8 F (36.6 C) (Oral)   Resp 15   Ht 5\' 4"  (1.626 m)   Wt 84 kg   LMP  (LMP Unknown)   SpO2 98%   BMI 31.79 kg/m  Physical Exam Vitals and nursing note reviewed.  Constitutional:      General: She is not in acute distress.    Appearance: She is well-developed.  HENT:     Head: Normocephalic and atraumatic.     Mouth/Throat:     Mouth: Mucous membranes are moist.  Eyes:     General: Vision grossly intact. Gaze aligned appropriately.     Extraocular Movements: Extraocular movements intact.     Conjunctiva/sclera: Conjunctivae normal.  Cardiovascular:     Rate and Rhythm: Normal rate and regular rhythm.  Pulses: Normal pulses.     Heart sounds: Normal heart sounds, S1 normal and S2 normal. No murmur heard.    No friction rub. No gallop.  Pulmonary:     Effort: Pulmonary effort is normal. No respiratory distress.     Breath sounds: Normal breath sounds.  Abdominal:     General: Bowel sounds are normal.     Palpations: Abdomen is soft.     Tenderness: There is no abdominal tenderness. There is no guarding or rebound.     Hernia: No hernia is present.  Musculoskeletal:        General: No swelling.     Cervical back: Full passive range of motion without pain, normal range of motion and neck supple. No spinous process tenderness or muscular tenderness. Normal range of motion.     Right lower leg: No edema.     Left lower leg: No edema.  Skin:    General: Skin is warm and dry.     Capillary Refill:  Capillary refill takes less than 2 seconds.     Findings: No ecchymosis, erythema, rash or wound.  Neurological:     General: No focal deficit present.     Mental Status: She is alert and oriented to person, place, and time.     GCS: GCS eye subscore is 4. GCS verbal subscore is 5. GCS motor subscore is 6.     Cranial Nerves: Cranial nerves 2-12 are intact.     Sensory: Sensation is intact.     Motor: Motor function is intact.     Coordination: Coordination is intact.  Psychiatric:        Attention and Perception: Attention normal.        Mood and Affect: Mood normal.        Speech: Speech normal.        Behavior: Behavior normal.     ED Results / Procedures / Treatments   Labs (all labs ordered are listed, but only abnormal results are displayed) Labs Reviewed  COMPREHENSIVE METABOLIC PANEL - Abnormal; Notable for the following components:      Result Value   Potassium 3.1 (*)    Glucose, Bld 105 (*)    Calcium 8.7 (*)    Albumin 3.3 (*)    All other components within normal limits  SALICYLATE LEVEL - Abnormal; Notable for the following components:   Salicylate Lvl <7.0 (*)    All other components within normal limits  ACETAMINOPHEN LEVEL - Abnormal; Notable for the following components:   Acetaminophen (Tylenol), Serum <10 (*)    All other components within normal limits  CBC - Abnormal; Notable for the following components:   RBC 3.75 (*)    Hemoglobin 8.1 (*)    HCT 28.6 (*)    MCV 76.3 (*)    MCH 21.6 (*)    MCHC 28.3 (*)    RDW 20.7 (*)    All other components within normal limits  RAPID URINE DRUG SCREEN, HOSP PERFORMED - Abnormal; Notable for the following components:   Cocaine POSITIVE (*)    Tetrahydrocannabinol POSITIVE (*)    All other components within normal limits  ETHANOL  HCG, SERUM, QUALITATIVE    EKG None  Radiology No results found.  Procedures Procedures    Medications Ordered in ED Medications - No data to display  ED Course/  Medical Decision Making/ A&P  Medical Decision Making  Presents for psychiatric evaluation with auditory hallucinations and at least passive thoughts of harming herself. TTS eval pending. Medically clear for eval        Final Clinical Impression(s) / ED Diagnoses Final diagnoses:  Auditory hallucinations  Suicidal ideation    Rx / DC Orders ED Discharge Orders     None         Adrien Dietzman, Canary Brim, MD 12/13/22 [phone]

## 2022-12-13 NOTE — ED Notes (Signed)
Notified EDP that pt K is 3.1

## 2022-12-13 NOTE — ED Triage Notes (Signed)
Sitter at the bedside.

## 2022-12-13 NOTE — ED Notes (Signed)
Checked patient cbg it was 40 notified the RN Josh of blood sugar patient is resting with sitter at bedside

## 2022-12-13 NOTE — Consult Note (Signed)
[facility] Consult Note  Patient Name: Kaitlyn Gray MRN: [id number] DOB: 1975-09-19 DATE OF Consult: 12/13/2022  PRIMARY PSYCHIATRIC DIAGNOSES  1.  Suicidal Ideations 2.  MDD with psychosis  RECOMMENDATIONS  Recommendations: Medication recommendations: Zyprexa 5mg  and Lorazepam 1mg  PO Q8H PRN for agitation. (Zyprexa and Lorazepam should be given 1 hour apart). Please do not exceed 20mg  of Zprexa within a 24 hour period. Please stop all antipsychotic and QTC prolongation medications if patient's QTC is greater than .   Non-Medication/therapeutic recommendations: refer to outpatient psych after discharge from inpatient psy admission. Patient is requesting to be sent to [facility] health. Communication: Treatment team members (and family members if applicable) who were involved in treatment/care discussions and planning, and with whom we spoke or engaged with via secure text/chat, include the following: Patient's current treatment team  Thank you for involving [place] in the care of this patient. If you have any additional questions or concerns, please call [phone] and ask for me or the provider on-call.  TELEPSYCHIATRY ATTESTATION & CONSENT  As the provider for this telehealth consult, I attest that I verified the patient's identity using two separate identifiers, introduced myself to the patient, provided my credentials, disclosed my location, and performed this encounter via a HIPAA-compliant, real-time, face-to-face, two-way, interactive audio and video platform and with the full consent and agreement of the patient (or guardian as applicable.)  Patient physical location: [facility]. Telehealth provider physical location: home office in state of [place].  Video start time: 0450 (Central Time) Video end time: 0458 (Central Time)  IDENTIFYING DATA  Kaitlyn Gray is a 47 y.o. year-old female for whom a psychiatric consultation has been ordered by the primary  provider. The patient was identified using two separate identifiers.  CHIEF COMPLAINT/REASON FOR CONSULT  Suicidal ideations  HISTORY OF PRESENT ILLNESS (HPI)  Patient is a 47 year old female with a history of bipolar disorder, depression, polysubstance abuse, CVA and multiple health issues. She presents to the ER for evaluation due to having suicidal ideations with plan to overdose and lay on the train tracks. Patient reports that she was hearing voices telling her to engage in harmful behavior and she called crisis for help. She reports feeling depressed and reports having visual hallucinations describing them as seeing people on the wall.  The patient reports that she was previously admitted to a mental health facility two weeks ago and stayed for one week.  She reports that she had been prescribed LAI Abilify but has not been taking her medications.  The patient denies having thoughts of hurting others but admits to having thoughts of self-harm. She reports smoking marijuana but denies use of alcohol. The patient expresses a preference to be admitted to [facility] facility.  PAST PSYCHIATRIC HISTORY  Reports history of inpatient mental health treatment. Reports having outpatient services at [facility] and reports she was on Abilify LAI Otherwise as per HPI above.  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) 07/19/2017   Hypertension     Hypertensive emergency 05/31/2022   Ischemic stroke (HCC) 01/01/2020   subacute right middle cerebellar peduncle and pons infarction   Left-sided weakness 01/27/2022   Head CT with remote right occipital infarct which is consistent with old left-sided weakness   MDD (major depressive disorder), recurrent severe, 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   Normocytic anemia 02/09/2020   Opiate use    History of Suboxone Therapy until 05/2021   Polysubstance abuse (HCC) 07/19/2017   Prescription drug misadventures (Seroquel)      HOME MEDICATIONS  Facility Ordered Medications  Medication   amLODipine (NORVASC) tablet 10 mg   aspirin EC tablet 81 mg   gabapentin (NEURONTIN) capsule 100 mg   losartan (COZAAR) tablet 100 mg   metFORMIN (GLUCOPHAGE) tablet 500 mg   pantoprazole (PROTONIX) EC tablet 40 mg   PARoxetine (PAXIL) tablet 20 mg   QUEtiapine (SEROQUEL) tablet 200 mg   rosuvastatin (CRESTOR) tablet 40 mg   nicotine (NICODERM CQ - dosed in mg/24 hours) patch 21 mg   insulin aspart (novoLOG) injection 0-15 Units   insulin aspart (novoLOG) injection 0-5 Units   PTA Medications  Medication Sig   amLODipine (NORVASC) 10 MG tablet Take 1 tablet (10 mg total) by mouth daily.   losartan (COZAAR) 100 MG tablet Take 1 tablet (100 mg total) by mouth daily.   rosuvastatin (CRESTOR) 40 MG tablet Take 1 tablet (40 mg total) by mouth daily.   QUEtiapine (SEROQUEL) 200 MG tablet Take 1 tablet (200 mg total) by mouth at bedtime.   metFORMIN (GLUCOPHAGE) 500 MG tablet Take 1 tablet (500 mg total) by mouth 2 (two) times daily with a meal.   pantoprazole (PROTONIX) 40 MG tablet Take 1 tablet (40 mg total) by mouth daily.   ferrous sulfate 325 (65 FE) MG tablet Take 1 tablet (325 mg total) by mouth every other day.   gabapentin (NEURONTIN) 300 MG capsule Take 1 capsule (300 mg total) by mouth at bedtime.   aspirin EC 81 MG tablet Take 81 mg by mouth daily. Swallow whole.   gabapentin (NEURONTIN) 100 MG  capsule Take 1 capsule (100 mg total) by mouth 3 (three) times daily for pain scale 4-7.   nicotine polacrilex (NICORETTE) 2 MG gum Take 1 each (2 mg total) by mouth every 2 (two) hours as needed for Smoking Cravings   PARoxetine (PAXIL) 20 MG tablet Take 1 tablet (20 mg total) by mouth daily for depression.   QUEtiapine (SEROQUEL) 200 MG tablet Take 1 tablet (200 mg total) by mouth at bedtime for mood     ALLERGIES  Allergies  Allergen Reactions   Beef-Derived Products     Pt does not eat red meat   Hydrocodone Itching   Latex Itching and Rash    SOCIAL & SUBSTANCE USE HISTORY  Social History   Socioeconomic History   Marital status: Single    Spouse name: Not on file   Number of children: Not on file   Years of education: Not on file   Highest education level: Not on file  Occupational History   Not on file  Tobacco Use   Smoking status: Former    Packs/day: .5    Types: Cigarettes    Passive exposure: Current   Smokeless tobacco: Former  Building services engineer Use: Never used  Substance and Sexual Activity   Alcohol use: No   Drug use:  Yes    Types: Marijuana, Cocaine   Sexual activity: Not on file  Other Topics Concern   Not on file  Social History Narrative   ** Merged History Encounter **       Social Determinants of Health   Financial Resource Strain: Not on file  Food Insecurity: Food Insecurity Present (08/04/2022)   Hunger Vital Sign    Worried About Running Out of Food in the Last Year: Sometimes true    Ran Out of Food in the Last Year: Sometimes true  Transportation Needs: Unmet Transportation Needs (08/04/2022)   PRAPARE - Administrator, Civil Service (Medical): Yes    Lack of Transportation (Non-Medical): Yes  Physical Activity: Not on file  Stress: Not on file  Social Connections: Not on file   Social History   Tobacco Use  Smoking Status Former   Packs/day: .5   Types: Cigarettes   Passive exposure: Current  Smokeless Tobacco  Former   Social History   Substance and Sexual Activity  Alcohol Use No   Social History   Substance and Sexual Activity  Drug Use Yes   Types: Marijuana, Cocaine    Additional pertinent information .  FAMILY HISTORY  Family History  Problem Relation Age of Onset   Hypertension Mother    CAD Mother 26       died of MI at age 6   Hypertension Father    Family Psychiatric History (if known):  unknown  MENTAL STATUS EXAM (MSE)  Presentation  General Appearance:  Disheveled  Eye Contact: Fair  Speech: Garbled (dysphagia due to CVA)  Speech Volume: Normal  Handedness: -- (unknown)   Mood and Affect  Mood: Depressed  Affect: Depressed   Thought Process  Thought Processes: Coherent  Descriptions of Associations: Intact  Orientation: Full (Time, Place and Person)  Thought Content: Logical  History of Schizophrenia/Schizoaffective disorder: No  Duration of Psychotic Symptoms: Greater than six months  Hallucinations:Hallucinations: Auditory; Visual Description of Auditory Hallucinations: reports hearing to harm herself Description of Visual Hallucinations: reports seeing people on the walls  Ideas of Reference: None  Suicidal Thoughts:Suicidal Thoughts: Yes, Active SI Active Intent and/or Plan: With Plan  Homicidal Thoughts:Homicidal Thoughts: No   Sensorium  Memory: Immediate Fair; Remote Fair  Judgment: Fair  Insight: Fair   Art therapist  Concentration: Fair  Attention Span: Fair  Recall: [employer] of Knowledge: Fair  Language: Poor   Psychomotor Activity  Psychomotor Activity:Psychomotor Activity: -- (unable to assess)  Assets  Assets: Communication Skills; Desire for Improvement   Sleep  Sleep:Sleep: Fair   VITALS  Blood pressure (!) 151/95, pulse 62, temperature 97.8 F (36.6 C), temperature source Oral, resp. rate 15, height 5\' 4"  (1.626 m), weight 84 kg, SpO2 98 %.  LABS  Admission on  12/13/2022  Component Date Value Ref Range Status   Sodium 12/13/2022 140  135 - 145 mmol/L Final   Potassium 12/13/2022 3.1 (L)  3.5 - 5.1 mmol/L Final   Chloride 12/13/2022 110  98 - 111 mmol/L Final   CO2 12/13/2022 23  22 - 32 mmol/L Final   Glucose, Bld 12/13/2022 105 (H)  70 - 99 mg/dL Final   Glucose reference range applies only to samples taken after fasting for at least 8 hours.   BUN 12/13/2022 12  6 - 20 mg/dL Final   Creatinine, Ser 12/13/2022 0.82  0.44 - 1.00 mg/dL Final   Calcium 10/96/0454 8.7 (L)  8.9 - 10.3  mg/dL Final   Total Protein 95/28/4132 6.9  6.5 - 8.1 g/dL Final   Albumin 44/06/270 3.3 (L)  3.5 - 5.0 g/dL Final   AST 53/66/4403 16  15 - 41 U/L Final   ALT 12/13/2022 15  0 - 44 U/L Final   Alkaline Phosphatase 12/13/2022 67  38 - 126 U/L Final   Total Bilirubin 12/13/2022 0.6  0.3 - 1.2 mg/dL Final   GFR, Estimated 12/13/2022 >60  >60 mL/min Final   Comment: (NOTE) Calculated using the CKD-EPI Creatinine Equation (2021)    Anion gap 12/13/2022 7  5 - 15 Final   Performed at [facility], [address], [place], [place] [address]   Alcohol, Ethyl (B) 12/13/2022 <10  <10 mg/dL Final   Comment: (NOTE) Lowest detectable limit for serum alcohol is 10 mg/dL.  For medical purposes only. Performed at [facility], [address], [place], [place] [address]    Salicylate Lvl 12/13/2022 <7.0 (L)  7.0 - 30.0 mg/dL Final   Performed at [facility], [address], [place], [place] [address]   Acetaminophen (Tylenol), Serum 12/13/2022 <10 (L)  10 - 30 ug/mL Final   Comment: (NOTE) Therapeutic concentrations vary significantly. A range of 10-30 ug/mL  may be an effective concentration for many patients. However, some  are best treated at concentrations outside of this range. Acetaminophen concentrations >150 ug/mL at 4 hours after ingestion  and >50 ug/mL at 12 hours after ingestion are often associated with  toxic reactions.  Performed at  [facility], [address], [place], [place] [address]    WBC 12/13/2022 4.3  4.0 - 10.5 K/uL Final   RBC 12/13/2022 3.75 (L)  3.87 - 5.11 MIL/uL Final   Hemoglobin 12/13/2022 8.1 (L)  12.0 - 15.0 g/dL Final   Comment: Reticulocyte Hemoglobin testing may be clinically indicated, consider ordering this additional test [id number]    HCT 12/13/2022 28.6 (L)  36.0 - 46.0 % Final   MCV 12/13/2022 76.3 (L)  80.0 - 100.0 fL Final   MCH 12/13/2022 21.6 (L)  26.0 - 34.0 pg Final   MCHC 12/13/2022 28.3 (L)  30.0 - 36.0 g/dL Final   RDW 30/16/0109 20.7 (H)  11.5 - 15.5 % Final   Platelets 12/13/2022 395  150 - 400 K/uL Final   nRBC 12/13/2022 0.0  0.0 - 0.2 % Final   Performed at [facility], [address], [place], [place] [address]   Opiates 12/13/2022 NONE DETECTED  NONE DETECTED Final   Cocaine 12/13/2022 POSITIVE (A)  NONE DETECTED Final   Benzodiazepines 12/13/2022 NONE DETECTED  NONE DETECTED Final   Amphetamines 12/13/2022 NONE DETECTED  NONE DETECTED Final   Tetrahydrocannabinol 12/13/2022 POSITIVE (A)  NONE DETECTED Final   Barbiturates 12/13/2022 NONE DETECTED  NONE DETECTED Final   Comment: (NOTE) DRUG SCREEN FOR MEDICAL PURPOSES ONLY.  IF CONFIRMATION IS NEEDED FOR ANY PURPOSE, NOTIFY LAB WITHIN 5 DAYS.  LOWEST DETECTABLE LIMITS FOR URINE DRUG SCREEN Drug Class                     Cutoff (ng/mL) Amphetamine and metabolites    1000 Barbiturate and metabolites    200 Benzodiazepine                 200 Opiates and metabolites        300 Cocaine and metabolites        300 THC  50 Performed at [facility], [address] [place], [place] [address]    Preg, Serum 12/13/2022 NEGATIVE  NEGATIVE Final   Performed at [facility], [address] [place], [place] [address]    PSYCHIATRIC REVIEW OF SYSTEMS (ROS)  - Patient expressed feelings of depression. - Reported auditory hallucinations, specifically voices instructing  harmful behaviors. - Reported visual hallucinations, seeing people on walls - Acknowledged suicidal ideation but denied intent to harm others.  Additional findings:      Musculoskeletal: unable to assess      Gait & Station: unable to assess      Pain Screening: none reported      Nutrition & Dental Concerns:   RISK FORMULATION/ASSESSMENT  Is the patient experiencing any suicidal or homicidal ideations: Yes       Explain if yes: has plan to overdose and lay on the train tracks Protective factors considered for safety management: access to appropriate clinical intervention  Risk factors/concerns considered for safety management:  Prior attempt Depression Substance abuse/dependence  Is there a safety management plan with the patient and treatment team to minimize risk factors and promote protective factors: Yes           Explain: admit to psych Is crisis care placement or psychiatric hospitalization recommended: Yes     Based on my current evaluation and risk assessment, patient is determined at this time to be at:  Moderate Risk  *RISK ASSESSMENT Risk assessment is a dynamic process; it is possible that this patient's condition, and risk level, may change. This should be re-evaluated and managed over time as appropriate. Please re-consult psychiatric consult services if additional assistance is needed in terms of risk assessment and management. If your team decides to discharge this patient, please advise the patient how to best access emergency psychiatric services, or to call 911, if their condition worsens or they feel unsafe in any way.   Norval Morton, NP Telepsychiatry Consult Services

## 2022-12-13 NOTE — Progress Notes (Addendum)
[facility] Psych ED Progress Note  12/13/2022 4:31 PM Kaitlyn Gray  MRN:  [id number]   Subjective:    Kaitlyn Gray is a 47 year old African-American female with a past psychiatric history of bipolar 1 disorder, MDD, adjustment disorder with disturbance of conduct, cannabis use disorder, cocaine use disorder, polysubstance abuse, and altered mental status, with pertinent medical comorbidities/history that include ischemic stroke (2021), hypertension, chronic anemia, cervical fracture, dysarthria, and left-sided weakness, who presented this encounter by way of law enforcement after crisis worker called to have patient brought into the emergency department for evaluation due to acting bizarrely.  Patient currently medically cleared.  Patient currently not involuntary committed.  Patient seen today for face-to-face reevaluation at [facility] emergency department.  Patient today tells me that she has been severely depressed lately, forwards that her lights are off, she has no running water, and her air conditioning is not working at her place, which has been making her feel suicidal and having command auditory hallucinations.  She tells me that her desire is for inpatient hospitalization to restart medications and hopefully get resources to take care of herself.  Patient tells me that in the next few days she should have her disability income start kicking in, states that this will help, but also that she needs inpatient treatment for medication management. Objectively, patient does not appear to be responding to internal stimuli during our engagement, does not appear to present with any paranoid ideations, ideas of reference, and/or delusional themes. Patient continues to endorse suicidal ideations with, plan, intent and means. Patient orientation intact. Patient tells me she relapsed on cocaine and marijuana, shares that she would also like help with not abusing these substances as well.  Principal Problem:  MDD (major depressive disorder), recurrent, severe, with psychosis (HCC) Diagnosis:  Principal Problem:   MDD (major depressive disorder), recurrent, severe, with psychosis (HCC)   ED Assessment Time Calculation: Start Time: 1600 Stop Time: 1630 Total Time in Minutes (Assessment Completion): 30   Past Psychiatric History: Adjustment disorder with disturbance of conduct, bipolar 1 disorder, cannabis use disorder moderate dependence, cocaine use disorder severe dependence, polysubstance abuse, MDD  [place] Scale:  Flowsheet Row ED from 12/13/2022 in [facility] Emergency Department at [facility] ED from 12/12/2022 in [facility] Emergency Department at [facility] ED from 12/07/2022 in [facility] Emergency Department at [facility]  C-SSRS RISK CATEGORY No Risk No Risk No Risk       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) 07/19/2017   Hypertension    Hypertensive emergency 05/31/2022   Ischemic stroke (HCC) 01/01/2020   subacute right middle cerebellar peduncle and pons infarction   Left-sided weakness 01/27/2022   Head CT with remote right occipital infarct which is consistent with old left-sided weakness   MDD (major depressive disorder), recurrent severe, 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   Normocytic anemia 02/09/2020   Opiate use    History of Suboxone  Therapy until 05/2021   Polysubstance abuse (HCC) 07/19/2017    Prescription drug misadventures (Seroquel)     Past Surgical History:  Procedure Laterality Date   CESAREAN SECTION     Family History:  Family History  Problem Relation Age of Onset   Hypertension Mother    CAD Mother 71       died of MI at age 68   Hypertension Father    Family Psychiatric  History: None endorsed Social History:  Social History   Substance and Sexual Activity  Alcohol Use No     Social History   Substance and Sexual Activity  Drug Use Yes   Types: Marijuana, Cocaine    Social History   Socioeconomic History   Marital status: Single    Spouse name: Not on file   Number of children: Not on file   Years of education: Not on file   Highest education level: Not on file  Occupational History   Not on file  Tobacco Use   Smoking status: Former    Packs/day: .5    Types: Cigarettes    Passive exposure: Current   Smokeless tobacco: Former  Building services engineer Use: Never used  Substance and Sexual Activity   Alcohol use: No   Drug use: Yes    Types: Marijuana, Cocaine   Sexual activity: Not on file  Other Topics Concern   Not on file  Social History Narrative   ** Merged History Encounter **       Social Determinants of Health   Financial Resource Strain: Not on file  Food Insecurity: Food Insecurity Present (08/04/2022)   Hunger Vital Sign    Worried About Running Out of Food in the Last Year: Sometimes true    Ran Out of Food in the Last Year: Sometimes true  Transportation Needs: Unmet Transportation Needs (08/04/2022)   PRAPARE - Administrator, Civil Service (Medical): Yes    Lack of Transportation (Non-Medical): Yes  Physical Activity: Not on file  Stress: Not on file  Social Connections: Not on file    Sleep: Poor  Appetite:  Poor  Current Medications: Current Facility-Administered Medications  Medication Dose Route Frequency Provider Last Rate Last Admin   amLODipine (NORVASC) tablet 10 mg  10 mg Oral Daily  Gilda Crease, MD   10 mg at 12/13/22 1233   aspirin EC tablet 81 mg  81 mg Oral Daily Gilda Crease, MD   81 mg at 12/13/22 1233   gabapentin (NEURONTIN) capsule 100 mg  100 mg Oral TID Gilda Crease, MD   100 mg at 12/13/22 1233   insulin aspart (novoLOG) injection 0-15 Units  0-15 Units Subcutaneous TID WC Pollina, Canary Brim, MD       insulin aspart (novoLOG) injection 0-5 Units  0-5 Units Subcutaneous QHS Pollina, Canary Brim, MD       LORazepam (ATIVAN) tablet 1 mg  1 mg Oral Q8H PRN Candis Shine, Sherifat, NP       losartan (COZAAR) tablet 100 mg  100 mg Oral Daily Pollina, Canary Brim, MD   100 mg at 12/13/22 1233   metFORMIN (GLUCOPHAGE) tablet 500 mg  500 mg Oral BID WC Pollina, Canary Brim, MD   500 mg at 12/13/22 0757   nicotine (NICODERM CQ - dosed in mg/24 hours) patch 21 mg  21 mg Transdermal Once Pollina, Canary Brim, MD       OLANZapine zydis (ZYPREXA) disintegrating tablet 5 mg  5 mg Oral Q8H PRN Norval Morton, NP   5 mg at 12/13/22 0756   pantoprazole (PROTONIX) EC tablet 40 mg  40 mg Oral Daily Gilda Crease, MD   40 mg at 12/13/22 1233   PARoxetine (PAXIL) tablet 20 mg  20 mg Oral Daily Gilda Crease, MD   20 mg at 12/13/22 1232   potassium chloride SA (KLOR-CON M) CR tablet 40 mEq  40 mEq Oral Once Lonell Grandchild, MD       QUEtiapine (SEROQUEL) tablet 200 mg  200 mg Oral QHS Pollina, Canary Brim, MD       rosuvastatin (CRESTOR) tablet 40 mg  40 mg Oral Daily Gilda Crease, MD   40 mg at 12/13/22 1234   Current Outpatient Medications  Medication Sig Dispense Refill   amLODipine (NORVASC) 10 MG tablet Take 1 tablet (10 mg total) by mouth daily. 30 tablet 2   aspirin EC 81 MG tablet Take 81 mg by mouth daily. Swallow whole.     ferrous sulfate 325 (65 FE) MG tablet Take 1 tablet (325 mg total) by mouth every other day. 15 tablet 2   gabapentin (NEURONTIN) 100 MG capsule Take 1 capsule (100 mg total) by mouth  3 (three) times daily for pain scale 4-7. 45 capsule 1   gabapentin (NEURONTIN) 300 MG capsule Take 1 capsule (300 mg total) by mouth at bedtime. 30 capsule 2   losartan (COZAAR) 100 MG tablet Take 1 tablet (100 mg total) by mouth daily. 30 tablet 2   metFORMIN (GLUCOPHAGE) 500 MG tablet Take 1 tablet (500 mg total) by mouth 2 (two) times daily with a meal. 60 tablet 2   nicotine polacrilex (NICORETTE) 2 MG gum Take 1 each (2 mg total) by mouth every 2 (two) hours as needed for Smoking Cravings 180 each 1   pantoprazole (PROTONIX) 40 MG tablet Take 1 tablet (40 mg total) by mouth daily. 30 tablet 2   PARoxetine (PAXIL) 20 MG tablet Take 1 tablet (20 mg total) by mouth daily for depression. 15 tablet 1   QUEtiapine (SEROQUEL) 200 MG tablet Take 1 tablet (200 mg total) by mouth at bedtime. 30 tablet 2   QUEtiapine (SEROQUEL) 200 MG tablet Take 1 tablet (200 mg total) by mouth at bedtime for mood 15 tablet 1   rosuvastatin (CRESTOR) 40 MG tablet Take 1 tablet (40 mg total) by mouth daily. 30 tablet 2    Lab Results:  Results for orders placed or performed during the hospital encounter of 12/13/22 (from the past 48 hour(s))  Rapid urine drug screen (hospital performed)     Status: Abnormal   Collection Time: 12/13/22  1:43 AM  Result Value Ref Range   Opiates NONE DETECTED NONE DETECTED   Cocaine POSITIVE (A) NONE DETECTED   Benzodiazepines NONE DETECTED NONE DETECTED   Amphetamines NONE DETECTED NONE DETECTED   Tetrahydrocannabinol POSITIVE (A) NONE DETECTED   Barbiturates NONE DETECTED NONE DETECTED    Comment: (NOTE) DRUG SCREEN FOR MEDICAL PURPOSES ONLY.  IF CONFIRMATION IS NEEDED FOR ANY PURPOSE, NOTIFY LAB WITHIN 5 DAYS.  LOWEST DETECTABLE LIMITS FOR URINE DRUG SCREEN Drug Class                     Cutoff (ng/mL) Amphetamine and metabolites    1000 Barbiturate and metabolites    200 Benzodiazepine                 200 Opiates and metabolites  300 Cocaine and metabolites         300 THC                            50 Performed at [facility], [address], [place], [place] [address]   Comprehensive metabolic panel     Status: Abnormal   Collection Time: 12/13/22  1:45 AM  Result Value Ref Range   Sodium 140 135 - 145 mmol/L   Potassium 3.1 (L) 3.5 - 5.1 mmol/L   Chloride 110 98 - 111 mmol/L   CO2 23 22 - 32 mmol/L   Glucose, Bld 105 (H) 70 - 99 mg/dL    Comment: Glucose reference range applies only to samples taken after fasting for at least 8 hours.   BUN 12 6 - 20 mg/dL   Creatinine, Ser 6.04 0.44 - 1.00 mg/dL   Calcium 8.7 (L) 8.9 - 10.3 mg/dL   Total Protein 6.9 6.5 - 8.1 g/dL   Albumin 3.3 (L) 3.5 - 5.0 g/dL   AST 16 15 - 41 U/L   ALT 15 0 - 44 U/L   Alkaline Phosphatase 67 38 - 126 U/L   Total Bilirubin 0.6 0.3 - 1.2 mg/dL   GFR, Estimated >54 >09 mL/min    Comment: (NOTE) Calculated using the CKD-EPI Creatinine Equation (2021)    Anion gap 7 5 - 15    Comment: Performed at [facility], [address] [place], [place] [address]  Ethanol     Status: None   Collection Time: 12/13/22  1:45 AM  Result Value Ref Range   Alcohol, Ethyl (B) <10 <10 mg/dL    Comment: (NOTE) Lowest detectable limit for serum alcohol is 10 mg/dL.  For medical purposes only. Performed at [facility], [address] [place], [place] [address]   Salicylate level     Status: Abnormal   Collection Time: 12/13/22  1:45 AM  Result Value Ref Range   Salicylate Lvl <7.0 (L) 7.0 - 30.0 mg/dL    Comment: Performed at [facility], [address] [place], [place] [address]  Acetaminophen level     Status: Abnormal   Collection Time: 12/13/22  1:45 AM  Result Value Ref Range   Acetaminophen (Tylenol), Serum <10 (L) 10 - 30 ug/mL    Comment: (NOTE) Therapeutic concentrations vary significantly. A range of 10-30 ug/mL  may be an effective concentration for many patients. However, some  are best treated at concentrations outside of this  range. Acetaminophen concentrations >150 ug/mL at 4 hours after ingestion  and >50 ug/mL at 12 hours after ingestion are often associated with  toxic reactions.  Performed at [facility], [address], [place], [place] [address]   cbc     Status: Abnormal   Collection Time: 12/13/22  1:45 AM  Result Value Ref Range   WBC 4.3 4.0 - 10.5 K/uL   RBC 3.75 (L) 3.87 - 5.11 MIL/uL   Hemoglobin 8.1 (L) 12.0 - 15.0 g/dL    Comment: Reticulocyte Hemoglobin testing may be clinically indicated, consider ordering this additional test [id number]    HCT 28.6 (L) 36.0 - 46.0 %   MCV 76.3 (L) 80.0 - 100.0 fL   MCH 21.6 (L) 26.0 - 34.0 pg   MCHC 28.3 (L) 30.0 - 36.0 g/dL   RDW 28.4 (H) 13.2 - 44.0 %   Platelets 395 150 -  400 K/uL   nRBC 0.0 0.0 - 0.2 %    Comment: Performed at [facility], [address], [place], [place] [address]  hCG, serum, qualitative     Status: None   Collection Time: 12/13/22  1:45 AM  Result Value Ref Range   Preg, Serum NEGATIVE NEGATIVE    Comment: Performed at [facility], [address], [place], [place] [address]  CBG monitoring, ED     Status: Abnormal   Collection Time: 12/13/22  7:29 AM  Result Value Ref Range   Glucose-Capillary 102 (H) 70 - 99 mg/dL    Comment: Glucose reference range applies only to samples taken after fasting for at least 8 hours.    Blood Alcohol level:  Lab Results  Component Value Date   ETH <10 12/13/2022   ETH <10 12/08/2022    Physical Findings:  CIWA:    COWS:     Musculoskeletal: Strength & Muscle Tone: decreased Gait & Station: unsteady Patient leans: N/A  Psychiatric Specialty Exam:  Presentation  General Appearance:  Disheveled  Eye Contact: Fair  Speech: Garbled  Speech Volume: Normal  Handedness: Right   Mood and Affect  Mood: Depressed  Affect: Constricted; Depressed   Thought Process  Thought Processes: Coherent; Linear; Goal Directed  Descriptions of  Associations:Intact  Orientation:Full (Time, Place and Person)  Thought Content:Logical  History of Schizophrenia/Schizoaffective disorder:No  Duration of Psychotic Symptoms:Greater than six months  Hallucinations:Hallucinations: Auditory Description of Auditory Hallucinations: Reports at times she will hear voices telling her to harm herself Description of Visual Hallucinations: reports seeing people on the walls  Ideas of Reference:None  Suicidal Thoughts:Suicidal Thoughts: Yes, Active SI Active Intent and/or Plan: With Intent; With Plan; With Means to Carry Out; With Access to Means  Homicidal Thoughts:Homicidal Thoughts: No   Sensorium  Memory: Immediate Fair; Recent Fair; Remote Fair  Judgment: Fair  Insight: Fair   Art therapist  Concentration: Fair  Attention Span: Fair  Recall: [employer] of Knowledge: Fair  Language: Fair   Psychomotor Activity  Psychomotor Activity: Psychomotor Activity: Normal   Assets  Assets: Housing; Physical Health; Financial Resources/Insurance   Sleep  Sleep: Sleep: Poor    Physical Exam: Physical Exam Vitals and nursing note reviewed.  Constitutional:      Appearance: She is obese.  Pulmonary:     Effort: Pulmonary effort is normal.  Neurological:     Mental Status: She is alert and oriented to person, place, and time.  Psychiatric:        Attention and Perception: She perceives auditory hallucinations. She does not perceive visual hallucinations.        Speech: Speech is slurred.        Behavior: Behavior is cooperative.        Thought Content: Thought content is not paranoid. Thought content includes suicidal ideation. Thought content does not include homicidal ideation. Thought content includes suicidal plan.    Review of Systems  Psychiatric/Behavioral:  Positive for depression, hallucinations, substance abuse and suicidal ideas. The patient is nervous/anxious.   All other systems reviewed  and are negative.  Blood pressure (!) 157/84, pulse (!) 51, temperature 98.1 F (36.7 C), resp. rate 12, height 5\' 4"  (1.626 m), weight 84 kg, SpO2 97 %. Body mass index is 31.79 kg/m.   Medical Decision Making:  Patient continues to present with expressions of suicidal ideations with means, intent, and plan, as well as command auditory hallucinations that are congruent to  depressive mood.  Plan remains the same, psychiatry will seek disposition for inpatient hospitalization, for safety of the patient.   Recommendations  -Continue medication started -Continue to recommend inpatient  Lenox Ponds, NP 12/13/2022, 4:31 PM

## 2022-12-13 NOTE — Progress Notes (Addendum)
1610  This writer was sent to the triage area by the staffing office and the charge nurse to see if a sitter was needed for this pt.  The charge nurse for the triage area felt this patient needed a sitter.  This writer/sitter went in the pt. room to  introduce myself and to find out from pt. why she was here.  While trying to establish why the pt. was here the pt.hops up and grabs her roller walker and said she was leaving "because nobody cares about B[place]".  This pt. is hard to understand as she talks in a child like mumble like voice.  This Clinical research associate and another staff talked the pt. Into staying in the room until she is seen because this pt. wanted a sandwich and staff told pt. the only way she could get a sandwich is if she sat back down and wait to be seen.  Pt  did sit back down.  This Clinical research associate asked the pt. how did she arrive to the hospital and she said by the "police".  I asked the pt. why did the police bring her to the hospital she said that she was going to kill herself.  This Clinical research associate asked the pt. did she call the police and the pt. Said "No" the crisis line called the police. The pt told this writer that she called the crisis line who are the ones who called the police to come to her house in which she lives alone because she wanted to kill herself.  This Clinical research associate asked the pt what was her plan and she said her plan was to " take all her Seroquel and go lay on the train tracks".  This pt. has been given a sandwich, apple sauce, graham crackers and a lemon lime soda in a cup of ice.  This pt. Is currently waiting in the triage  room.

## 2022-12-14 DIAGNOSIS — F333 Major depressive disorder, recurrent, severe with psychotic symptoms: Secondary | ICD-10-CM

## 2022-12-14 LAB — CBG MONITORING, ED: Glucose-Capillary: 100 mg/dL — ABNORMAL HIGH (ref 70–99)

## 2022-12-14 MED ORDER — PAROXETINE HCL 20 MG PO TABS: 20.0000 mg | ORAL_TABLET | Freq: Every day | ORAL | 0 refills | Status: DC

## 2022-12-14 MED ORDER — QUETIAPINE FUMARATE 200 MG PO TABS: 200.0000 mg | ORAL_TABLET | Freq: Every day | ORAL | 0 refills | Status: DC

## 2022-12-14 MED ORDER — PAROXETINE HCL 20 MG PO TABS
20.0000 mg | ORAL_TABLET | Freq: Every day | ORAL | 0 refills | Status: DC
  Filled 2022-12-14: qty 15, 15d supply, fill #0

## 2022-12-14 MED ORDER — QUETIAPINE FUMARATE 200 MG PO TABS
200.0000 mg | ORAL_TABLET | Freq: Every day | ORAL | 0 refills | Status: DC
  Filled 2022-12-14: qty 15, 15d supply, fill #0

## 2022-12-14 NOTE — ED Provider Notes (Signed)
Patient has been psych cleared.  Resources have been given and taxi voucher has been provided.  Patient stable at time of discharge.   Kaitlyn Gray, [place] 12/14/22 [id number]

## 2022-12-14 NOTE — Progress Notes (Addendum)
[facility] Psych ED Progress Note  12/14/2022 9:26 AM Kaitlyn Gray  MRN:  [id number]   Subjective:    Kaitlyn Gray is a 47 year old African-American female with a past psychiatric history of bipolar 1 disorder, MDD, adjustment disorder with disturbance of conduct, cannabis use disorder, cocaine use disorder, polysubstance abuse, and altered mental status, with pertinent medical comorbidities/history that include ischemic stroke (2021), hypertension, chronic anemia, cervical fracture, dysarthria, and left-sided weakness, who presented this encounter by way of law enforcement after crisis worker called to have patient brought into the emergency department for evaluation due to acting bizarrely. Patient currently medically cleared.  Patient currently not involuntary committed.   Patient seen today for face-to-face reevaluation at [facility] emergency department. Patient tells me this morning adamantly that she has been lying about her symptoms and how she has been feeling, tells me that she has been endorsing command auditory hallucinations and thoughts to harm herself, because she has had a desire to be hospitalized until some more of her disability income is sent into her bank account. She endorses today adamantly that "I would never kill myself, I just could never do it".  She endorses however it is true that she does hear voices when she is depressed, though through more evaluation, appears to be more suggestive of internal negative dialogue versus psychotic features.  Patient tells me that it has been true she has been feeling a little bit lonely and experiencing some depressive symptoms.  Patient orientation is intact.  Patient does not objectively present with any psychotic features, paranoid ideations, and/or delusional themes.  Patient denies a history of suicide attempt. Patient verbalizes that she wants to leave and go back to her apartment, states that she was lying to some degree about her  housing situation, reports that she does have running water and food, just that her air conditioning has not been working because the maintenance people have not come yet to fix it.  She tells me that she has canned food at her house and that she will be fine, disability check also should be coming in by tomorrow, states that she just needs a ride home. Discussing the patient's endorsements of substance abuse treatment, patient today declines need for substance abuse treatment, wants to move forward with following up with the crisis center (I.e., [facility]) if needed, as well as for medication refills.  She tells me that she is eating and sleeping fine and tolerating medications restarted, wants to continue these on an outpatient basis.  Patient tells me that if she begins to feel unsafe she will report to the hospital and/or crisis center, reach out to mobile crisis, and/or reach out to her neighbor across the hall from her apartment complex whom is supportive of her. Patient expresses multiple times that she does not want to be sent to a facility far away, as that she will lose her housing that she currently has in her apartment, and she is "terrified" of going back to [facility], reports that they were very not nice to her.  Patient reports that she was only wanting to go to [facility], like she told previous provider, reiterates now she just wants to go home.   Principal Problem: MDD (major depressive disorder), recurrent, severe, with psychosis (HCC) Diagnosis:  Principal Problem:   MDD (major depressive disorder), recurrent, severe, with psychosis (HCC)   ED Assessment Time Calculation: Start Time: 0900 Stop Time: 0926 Total Time in Minutes (Assessment Completion): 26   Past Psychiatric History:  Adjustment disorder with disturbance of conduct, bipolar 1 disorder, cannabis use disorder moderate dependence, cocaine use disorder severe dependence, polysubstance abuse, MDD   [place] Scale:  Flowsheet Row ED  from 12/13/2022 in [facility] Emergency Department at [facility] ED from 12/12/2022 in [facility] Emergency Department at [facility] ED from 12/07/2022 in [facility] Emergency Department at [facility]  C-SSRS RISK CATEGORY No Risk No Risk No Risk       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) 07/19/2017   Hypertension    Hypertensive emergency 05/31/2022   Ischemic stroke (HCC) 01/01/2020   subacute right middle cerebellar peduncle and pons infarction   Left-sided weakness 01/27/2022   Head CT with remote right occipital infarct which is consistent with old left-sided weakness   MDD (major depressive disorder), recurrent severe, 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   Normocytic anemia 02/09/2020   Opiate use    History of Suboxone Therapy until 05/2021   Polysubstance abuse (HCC) 07/19/2017   Prescription drug misadventures (Seroquel)     Past Surgical History:  Procedure Laterality Date   CESAREAN SECTION     Family History:  Family History  Problem Relation Age of Onset   Hypertension Mother    CAD Mother 38       died of MI at age 19   Hypertension Father    Family Psychiatric  History: None endorsed Social History:  Social History   Substance and Sexual Activity  Alcohol Use No     Social History   Substance and Sexual Activity  Drug Use Yes   Types: Marijuana, Cocaine    Social History    Socioeconomic History   Marital status: Single    Spouse name: Not on file   Number of children: Not on file   Years of education: Not on file   Highest education level: Not on file  Occupational History   Not on file  Tobacco Use   Smoking status: Former    Packs/day: .5    Types: Cigarettes    Passive exposure: Current   Smokeless tobacco: Former  Building services engineer Use: Never used  Substance and Sexual Activity   Alcohol use: No   Drug use: Yes    Types: Marijuana, Cocaine   Sexual activity: Not on file  Other Topics Concern   Not on file  Social History Narrative   ** Merged History Encounter **       Social Determinants of Health   Financial Resource Strain: Not on file  Food Insecurity: Food Insecurity Present (08/04/2022)   Hunger Vital Sign    Worried About Running Out of Food in the Last Year: Sometimes true    Ran Out of Food in the Last Year: Sometimes true  Transportation Needs: Unmet Transportation Needs (08/04/2022)   PRAPARE - Administrator, Civil Service (Medical): Yes    Lack of Transportation (Non-Medical): Yes  Physical Activity: Not on file  Stress: Not on file  Social Connections: Not on file    Sleep: Good  Appetite:  Good  Current Medications: Current Facility-Administered Medications  Medication Dose Route Frequency Provider Last Rate Last Admin   amLODipine (NORVASC) tablet 10 mg  10 mg Oral Daily Gilda Crease, MD   10 mg at 12/14/22 2130   aspirin EC tablet 81 mg  81 mg Oral Daily Gilda Crease, MD   81 mg at 12/14/22 0911   gabapentin (NEURONTIN) capsule 100 mg  100 mg Oral TID Gilda Crease, MD   100 mg at 12/14/22 0910   insulin aspart (novoLOG) injection 0-15 Units  0-15 Units Subcutaneous TID WC Pollina, Canary Brim, MD       insulin aspart (novoLOG) injection 0-5 Units  0-5 Units Subcutaneous QHS Pollina, Canary Brim, MD       LORazepam (ATIVAN) tablet 1 mg  1 mg Oral Q8H PRN Candis Shine,  Sherifat, NP       losartan (COZAAR) tablet 100 mg  100 mg Oral Daily Gilda Crease, MD   100 mg at 12/14/22 8657   metFORMIN (GLUCOPHAGE) tablet 500 mg  500 mg Oral BID WC Gilda Crease, MD   500 mg at 12/14/22 8469   nicotine (NICODERM CQ - dosed in mg/24 hours) patch 21 mg  21 mg Transdermal Once Pollina, Canary Brim, MD       OLANZapine zydis (ZYPREXA) disintegrating tablet 5 mg  5 mg Oral Q8H PRN Candis Shine, Sherifat, NP   5 mg at 12/13/22 0756   pantoprazole (PROTONIX) EC tablet 40 mg  40 mg Oral Daily Gilda Crease, MD   40 mg at 12/14/22 0910   PARoxetine (PAXIL) tablet 20 mg  20 mg Oral Daily Gilda Crease, MD   20 mg at 12/14/22 0911   QUEtiapine (SEROQUEL) tablet 200 mg  200 mg Oral QHS Gilda Crease, MD   200 mg at 12/13/22 2133   rosuvastatin (CRESTOR) tablet 40 mg  40 mg Oral Daily Gilda Crease, MD   40 mg at 12/14/22 0910   Current Outpatient Medications  Medication Sig Dispense Refill   amLODipine (NORVASC) 10 MG tablet Take 1 tablet (10 mg total) by mouth daily. 30 tablet 2   aspirin EC 81 MG tablet Take 81 mg by mouth daily. Swallow whole.     gabapentin (NEURONTIN) 300 MG capsule Take 1 capsule (300 mg total) by mouth at bedtime. 30 capsule 2   losartan (COZAAR) 100 MG tablet Take 1 tablet (100 mg total) by mouth daily. 30 tablet 2   metFORMIN (GLUCOPHAGE) 500 MG tablet Take 1 tablet (500 mg total) by mouth 2 (two) times daily with a meal. 60 tablet 2   pantoprazole (PROTONIX) 40 MG tablet Take 1 tablet (40 mg total) by mouth daily. (Patient taking differently: Take 40 mg by mouth daily as needed (Indigestion).) 30 tablet 2   QUEtiapine (SEROQUEL) 200 MG tablet Take 1 tablet (200 mg total) by mouth at bedtime. 30 tablet 2   rosuvastatin (CRESTOR) 40 MG tablet Take 1 tablet (40 mg total) by mouth daily. 30 tablet 2   ferrous sulfate 325 (65 FE) MG tablet Take 1 tablet (325 mg total) by mouth every other day. (Patient not  taking: Reported on 12/14/2022) 15 tablet 2   gabapentin (NEURONTIN) 100 MG capsule Take 1 capsule (100 mg total) by mouth 3 (three) times daily for pain scale 4-7. (Patient not  taking: Reported on 12/14/2022) 45 capsule 1   nicotine polacrilex (NICORETTE) 2 MG gum Take 1 each (2 mg total) by mouth every 2 (two) hours as needed for Smoking Cravings (Patient not taking: Reported on 12/14/2022) 180 each 1   PARoxetine (PAXIL) 20 MG tablet Take 1 tablet (20 mg total) by mouth daily for depression. (Patient not taking: Reported on 12/14/2022) 15 tablet 1   QUEtiapine (SEROQUEL) 200 MG tablet Take 1 tablet (200 mg total) by mouth at bedtime for mood (Patient not taking: Reported on 12/14/2022) 15 tablet 1    Lab Results:  Results for orders placed or performed during the hospital encounter of 12/13/22 (from the past 48 hour(s))  Rapid urine drug screen (hospital performed)     Status: Abnormal   Collection Time: 12/13/22  1:43 AM  Result Value Ref Range   Opiates NONE DETECTED NONE DETECTED   Cocaine POSITIVE (A) NONE DETECTED   Benzodiazepines NONE DETECTED NONE DETECTED   Amphetamines NONE DETECTED NONE DETECTED   Tetrahydrocannabinol POSITIVE (A) NONE DETECTED   Barbiturates NONE DETECTED NONE DETECTED    Comment: (NOTE) DRUG SCREEN FOR MEDICAL PURPOSES ONLY.  IF CONFIRMATION IS NEEDED FOR ANY PURPOSE, NOTIFY LAB WITHIN 5 DAYS.  LOWEST DETECTABLE LIMITS FOR URINE DRUG SCREEN Drug Class                     Cutoff (ng/mL) Amphetamine and metabolites    1000 Barbiturate and metabolites    200 Benzodiazepine                 200 Opiates and metabolites        300 Cocaine and metabolites        300 THC                            50 Performed at [facility] Lab, [address] [place], [place] [address]   Comprehensive metabolic panel     Status: Abnormal   Collection Time: 12/13/22  1:45 AM  Result Value Ref Range   Sodium 140 135 - 145 mmol/L   Potassium 3.1 (L) 3.5 - 5.1 mmol/L    Chloride 110 98 - 111 mmol/L   CO2 23 22 - 32 mmol/L   Glucose, Bld 105 (H) 70 - 99 mg/dL    Comment: Glucose reference range applies only to samples taken after fasting for at least 8 hours.   BUN 12 6 - 20 mg/dL   Creatinine, Ser 1.91 0.44 - 1.00 mg/dL   Calcium 8.7 (L) 8.9 - 10.3 mg/dL   Total Protein 6.9 6.5 - 8.1 g/dL   Albumin 3.3 (L) 3.5 - 5.0 g/dL   AST 16 15 - 41 U/L   ALT 15 0 - 44 U/L   Alkaline Phosphatase 67 38 - 126 U/L   Total Bilirubin 0.6 0.3 - 1.2 mg/dL   GFR, Estimated >47 >82 mL/min    Comment: (NOTE) Calculated using the CKD-EPI Creatinine Equation (2021)    Anion gap 7 5 - 15    Comment: Performed at [facility], [address] [place], [place] [address]  Ethanol     Status: None   Collection Time: 12/13/22  1:45 AM  Result Value Ref Range   Alcohol, Ethyl (B) <10 <10 mg/dL    Comment: (NOTE) Lowest detectable limit for serum alcohol is 10 mg/dL.  For medical purposes only. Performed at [facility], [address], [place], [place] [address]   Salicylate level     Status: Abnormal   Collection Time: 12/13/22  1:45 AM  Result Value Ref Range   Salicylate Lvl <7.0 (L) 7.0 - 30.0 mg/dL    Comment: Performed at [facility], [address], [place], [place] [address]  Acetaminophen level     Status: Abnormal   Collection Time: 12/13/22  1:45 AM  Result Value Ref Range   Acetaminophen (Tylenol), Serum <10 (L) 10 - 30 ug/mL    Comment: (NOTE) Therapeutic concentrations vary significantly. A range of 10-30 ug/mL  may be an effective concentration for many patients. However, some  are best treated at concentrations outside of this range. Acetaminophen concentrations >150 ug/mL at 4 hours after ingestion  and >50 ug/mL at 12 hours after ingestion are often associated with  toxic reactions.  Performed at [facility], [address], [place], [place] [address]   cbc     Status: Abnormal   Collection Time: 12/13/22  1:45 AM   Result Value Ref Range   WBC 4.3 4.0 - 10.5 K/uL   RBC 3.75 (L) 3.87 - 5.11 MIL/uL   Hemoglobin 8.1 (L) 12.0 - 15.0 g/dL    Comment: Reticulocyte Hemoglobin testing may be clinically indicated, consider ordering this additional test [id number]    HCT 28.6 (L) 36.0 - 46.0 %   MCV 76.3 (L) 80.0 - 100.0 fL   MCH 21.6 (L) 26.0 - 34.0 pg   MCHC 28.3 (L) 30.0 - 36.0 g/dL   RDW 86.5 (H) 78.4 - 69.6 %   Platelets 395 150 - 400 K/uL   nRBC 0.0 0.0 - 0.2 %    Comment: Performed at [facility] Lab, [address], [place], [place] [address]  hCG, serum, qualitative     Status: None   Collection Time: 12/13/22  1:45 AM  Result Value Ref Range   Preg, Serum NEGATIVE NEGATIVE    Comment: Performed at [facility], [address] [place], [place] [address]  CBG monitoring, ED     Status: Abnormal   Collection Time: 12/13/22  7:29 AM  Result Value Ref Range   Glucose-Capillary 102 (H) 70 - 99 mg/dL    Comment: Glucose reference range applies only to samples taken after fasting for at least 8 hours.  CBG monitoring, ED     Status: Abnormal   Collection Time: 12/13/22  5:01 PM  Result Value Ref Range   Glucose-Capillary 112 (H) 70 - 99 mg/dL    Comment: Glucose reference range applies only to samples taken after fasting for at least 8 hours.  CBG monitoring, ED     Status: Abnormal   Collection Time: 12/13/22  9:29 PM  Result Value Ref Range   Glucose-Capillary 109 (H) 70 - 99 mg/dL    Comment: Glucose reference range applies only to samples taken after fasting for at least 8 hours.  CBG monitoring, ED     Status: Abnormal   Collection Time: 12/14/22  7:31 AM  Result Value Ref Range   Glucose-Capillary 100 (H) 70 - 99 mg/dL    Comment: Glucose reference range applies only to samples taken after fasting for at least 8 hours.    Blood Alcohol level:  Lab Results  Component Value Date   [facility] <10 12/13/2022   ETH <10 12/08/2022    Physical Findings:  CIWA:    COWS:  Musculoskeletal: Strength & Muscle Tone: decreased Gait & Station: unsteady Patient leans: N/A  Psychiatric Specialty Exam:  Presentation  General Appearance:  Disheveled  Eye Contact: Fair  Speech: Garbled (History of stroke)  Speech Volume: Normal  Handedness: Right   Mood and Affect  Mood: Euthymic  Affect: Other (comment) (Mildly constricted if neutral)   Thought Process  Thought Processes: Linear; Goal Directed  Descriptions of Associations:Intact  Orientation:Full (Time, Place and Person)  Thought Content:Logical  History of Schizophrenia/Schizoaffective disorder:No  Duration of Psychotic Symptoms:N/A  Hallucinations:Hallucinations: None Description of Auditory Hallucinations: Reports at times she will hear voices telling her to harm herself Description of Visual Hallucinations: reports seeing people on the walls  Ideas of Reference:None  Suicidal Thoughts:Suicidal Thoughts: No SI Active Intent and/or Plan: Without Intent; Without Plan; Without Means to Carry Out; Without Access to Means  Homicidal Thoughts:Homicidal Thoughts: No   Sensorium  Memory: Immediate Fair; Recent Fair; Remote Fair  Judgment: Intact  Insight: Fair   Chartered certified accountant: Fair  Attention Span: Fair  Recall: [employer] of Knowledge: Fair  Language: Fair   Psychomotor Activity  Psychomotor Activity: Psychomotor Activity: Normal   Assets  Assets: Health and safety inspector; Housing; Physical Health; Resilience; Communication Skills; Desire for Improvement   Sleep  Sleep: Sleep: Good    Physical Exam: Physical Exam Vitals and nursing note reviewed.  Pulmonary:     Effort: Pulmonary effort is normal.  Neurological:     Mental Status: She is alert and oriented to person, place, and time.  Psychiatric:        Attention and Perception: Attention and perception normal. She does not perceive auditory or visual  hallucinations.        Speech: Speech normal.        Behavior: Behavior normal. Behavior is cooperative.        Thought Content: Thought content is not paranoid or delusional. Thought content does not include homicidal or suicidal ideation.        Cognition and Memory: Cognition and memory normal.    Review of Systems  Psychiatric/Behavioral:  Positive for depression and substance abuse. Negative for hallucinations and suicidal ideas. The patient is not nervous/anxious and does not have insomnia.   All other systems reviewed and are negative.  Blood pressure (!) 144/86, pulse (!) 59, temperature 98 F (36.7 C), temperature source Oral, resp. rate 18, height 5\' 4"  (1.626 m), weight 84 kg, SpO2 100 %. Body mass index is 31.79 kg/m.   Medical Decision Making:  Patient today endorses that she has come to the hospital for essentially secondary gain and has been malingering.  She tells me that largely due to her social determinants of health, she has come to the hospital to address her mental health concerns, but feels now that she is safe to return home and expresses no suicidal ideations and/or psychotic features today.  Discussed extensively safety planning, as well as follow-up care needs, endorse that resources will be given upon discharge.  Patient is psychiatrically cleared at this time for EDP team.  Patient endorses that she needs a ride home, will place [facility] consult.  Consulted with Dr. Jannifer Franklin who agrees with the plan of care and discharge.  Safety Plan YSIDRA KANO will reach out to her neighbor, call 911 or call mobile crisis, or go to nearest emergency room if condition worsens or if suicidal thoughts become active Patients' will follow up with [facility] for outpatient psychiatric services (therapy/medication management). Will consider substance  abuse treatment. The suicide prevention education provided includes the following: Suicide risk factors Suicide prevention and  interventions National Suicide Hotline telephone number [facility] assessment telephone number [facility] Emergency Assistance 911 [facility] and/or Residential Mobile Crisis Unit telephone number  Recommendations  -Continue Seroquel 200 mg nightly -Continue Paxil 20 mg daily -Continue remaining home medications -Patient is psychiatrically cleared, recommend discharge -TOC consult to facilitate a ride home -Recommend follow-up with [facility] for medication refills and care  Lenox Ponds, NP 12/14/2022, 9:26 AM

## 2022-12-14 NOTE — ED Notes (Signed)
Pt asking this RN for more food and a bagel. Explained to pt that per rules no special trays can be ordered. Told pt that snack time is at 1000. Pt then came to nurse's station and asked other staff for more food and a hamburger and fries. 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 Student RN reiterated to pt about not being able to order special trays. Pt requiring repeated education d/t pt also went to her sitter asking for extra food and special trays. Pt then went back to her room.

## 2022-12-14 NOTE — ED Notes (Signed)
All of pt's belongings returned to pt from locker and security. Taxi voucher provided for transport to residence. Reviewed discharge instructions with patient. Follow-up care and resources reviewed. Patient verbalized understanding. Patient A&Ox4, VSS, and ambulatory with steady gait using rollator upon discharge.

## 2022-12-15 ENCOUNTER — Other Ambulatory Visit: Payer: Self-pay

## 2022-12-15 LAB — CBG MONITORING, ED: Glucose-Capillary: 83 mg/dL (ref 70–99)

## 2022-12-16 ENCOUNTER — Other Ambulatory Visit (HOSPITAL_COMMUNITY): Payer: Self-pay

## 2022-12-17 ENCOUNTER — Emergency Department (HOSPITAL_COMMUNITY)
Admission: EM | Admit: 2022-12-17 | Discharge: 2022-12-17 | Disposition: A | Discharge status: Home / Self Care | Payer: MEDICAID | Attending: Emergency Medicine | Admitting: Emergency Medicine

## 2022-12-17 ENCOUNTER — Encounter (HOSPITAL_COMMUNITY): Payer: Self-pay

## 2022-12-17 ENCOUNTER — Other Ambulatory Visit: Payer: Self-pay

## 2022-12-17 ENCOUNTER — Emergency Department (HOSPITAL_COMMUNITY): Payer: MEDICAID

## 2022-12-17 DIAGNOSIS — Z79899 Other long term (current) drug therapy: Secondary | ICD-10-CM | POA: Insufficient documentation

## 2022-12-17 DIAGNOSIS — Z91040 Latex allergy status: Secondary | ICD-10-CM | POA: Insufficient documentation

## 2022-12-17 DIAGNOSIS — I10 Essential (primary) hypertension: Secondary | ICD-10-CM | POA: Insufficient documentation

## 2022-12-17 DIAGNOSIS — R531 Weakness: Secondary | ICD-10-CM | POA: Diagnosis not present

## 2022-12-17 DIAGNOSIS — Z8673 Personal history of transient ischemic attack (TIA), and cerebral infarction without residual deficits: Secondary | ICD-10-CM | POA: Diagnosis not present

## 2022-12-17 DIAGNOSIS — Z7982 Long term (current) use of aspirin: Secondary | ICD-10-CM | POA: Insufficient documentation

## 2022-12-17 DIAGNOSIS — E119 Type 2 diabetes mellitus without complications: Secondary | ICD-10-CM | POA: Diagnosis not present

## 2022-12-17 DIAGNOSIS — Z7984 Long term (current) use of oral hypoglycemic drugs: Secondary | ICD-10-CM | POA: Insufficient documentation

## 2022-12-17 DIAGNOSIS — R4781 Slurred speech: Secondary | ICD-10-CM | POA: Diagnosis present

## 2022-12-17 DIAGNOSIS — F444 Conversion disorder with motor symptom or deficit: Secondary | ICD-10-CM | POA: Diagnosis not present

## 2022-12-17 LAB — COMPREHENSIVE METABOLIC PANEL
ALT: 15 U/L (ref 0–44)
AST: 21 U/L (ref 15–41)
Albumin: 3.2 g/dL — ABNORMAL LOW (ref 3.5–5.0)
Alkaline Phosphatase: 59 U/L (ref 38–126)
Anion gap: 9 (ref 5–15)
BUN: 11 mg/dL (ref 6–20)
CO2: 21 mmol/L — ABNORMAL LOW (ref 22–32)
Calcium: 8.3 mg/dL — ABNORMAL LOW (ref 8.9–10.3)
Chloride: 105 mmol/L (ref 98–111)
Creatinine, Ser: 0.78 mg/dL (ref 0.44–1.00)
GFR, Estimated: >60 mL/min (ref >60)
Glucose, Bld: 108 mg/dL — ABNORMAL HIGH (ref 70–99)
Potassium: 3.6 mmol/L (ref 3.5–5.1)
Sodium: 135 mmol/L (ref 135–145)
Total Bilirubin: 0.4 mg/dL (ref 0.3–1.2)
Total Protein: 6.6 g/dL (ref 6.5–8.1)

## 2022-12-17 LAB — DIFFERENTIAL
Abs Immature Granulocytes: 0.02 10*3/uL (ref 0.00–0.07)
Basophils Absolute: 0 10*3/uL (ref 0.0–0.1)
Basophils Relative: 1 %
Eosinophils Absolute: 0.1 10*3/uL (ref 0.0–0.5)
Eosinophils Relative: 2 %
Immature Granulocytes: 0 %
Lymphocytes Relative: 24 %
Lymphs Abs: 1.3 10*3/uL (ref 0.7–4.0)
Monocytes Absolute: 0.4 10*3/uL (ref 0.1–1.0)
Monocytes Relative: 7 %
Neutro Abs: 3.7 10*3/uL (ref 1.7–7.7)
Neutrophils Relative %: 66 %

## 2022-12-17 LAB — CBC
HCT: 29.3 % — ABNORMAL LOW (ref 36.0–46.0)
Hemoglobin: 8.4 g/dL — ABNORMAL LOW (ref 12.0–15.0)
MCH: 22 pg — ABNORMAL LOW (ref 26.0–34.0)
MCHC: 28.7 g/dL — ABNORMAL LOW (ref 30.0–36.0)
MCV: 76.9 fL — ABNORMAL LOW (ref 80.0–100.0)
Platelets: 225 10*3/uL (ref 150–400)
RBC: 3.81 MIL/uL — ABNORMAL LOW (ref 3.87–5.11)
RDW: 20.1 % — ABNORMAL HIGH (ref 11.5–15.5)
WBC: 5.6 10*3/uL (ref 4.0–10.5)
nRBC: 0 % (ref 0.0–0.2)

## 2022-12-17 LAB — POCT I-STAT, CHEM 8
BUN: 12 mg/dL (ref 6–20)
Calcium, Ion: 1.15 mmol/L (ref 1.15–1.40)
Chloride: 104 mmol/L (ref 98–111)
Creatinine, Ser: 0.9 mg/dL (ref 0.44–1.00)
Glucose, Bld: 108 mg/dL — ABNORMAL HIGH (ref 70–99)
HCT: 30 % — ABNORMAL LOW (ref 36.0–46.0)
Hemoglobin: 10.2 g/dL — ABNORMAL LOW (ref 12.0–15.0)
Potassium: 3.5 mmol/L (ref 3.5–5.1)
Sodium: 139 mmol/L (ref 135–145)
TCO2: 25 mmol/L (ref 22–32)

## 2022-12-17 LAB — HCG, SERUM, QUALITATIVE: Preg, Serum: NEGATIVE

## 2022-12-17 LAB — ETHANOL: Alcohol, Ethyl (B): <10 mg/dL (ref <10)

## 2022-12-17 MED ORDER — SODIUM CHLORIDE 0.9% FLUSH: 3.0000 mL | Freq: Once | INTRAVENOUS | Status: DC

## 2022-12-17 NOTE — Consult Note (Signed)
NEUROLOGY CONSULTATION NOTE   Date of service: December 17, 2022 Patient Name: Kaitlyn Gray MRN:  [id number] DOB:  21-Jun-1975 Reason for consult: "L sided weakness" Requesting Provider: No att. providers found _ _ _   _ __   _ __ _ _  __ __   _ __   __ _  History of Present Illness  Kaitlyn Gray is a 47 y.o. female with PMH significant for DM2, HTN, smoker 0.5ppd, prior strokes and functional neurologic symptom disorder, Bipolar 1, MDD, cocaine and cannabis use who presents with left sided weakness, child like demeanor.  She called 911 to report that she is starting to have a stroke. When EMS got there, she was sitting on her front porch. She was able to stand up and tilt and get to EMS bed. Was noted to have child like demeanor along with left sided weakness and brought in as a code stroke.  She is well known to our service and has had multiple prior similar presentations with both strokes as well as negative imaging.  LKW: 0457 mRS: 2 tNKASE: not offered, low suspicion for stroke, likely functional neurologic symptom disorder. Thrombectomy: not offered, low suspicion for stroke, likely functional neurologic symptom disorder. NIHSS components Score: Comment  1a Level of Conscious 0[x]  1[]  2[]  3[]      1b LOC Questions 0[]  1[x]  2[]       1c LOC Commands 0[x]  1[]  2[]       2 Best Gaze 0[x]  1[]  2[]       3 Visual 0[x]  1[]  2[]  3[]      4 Facial Palsy 0[x]  1[]  2[]  3[]      5a Motor Arm - left 0[]  1[]  2[]  3[]  4[x]  UN[]    5b Motor Arm - Right 0[x]  1[]  2[]  3[]  4[]  UN[]    6a Motor Leg - Left 0[]  1[]  2[]  3[]  4[x]  UN[]    6b Motor Leg - Right 0[x]  1[]  2[]  3[]  4[]  UN[]    7 Limb Ataxia 0[x]  1[]  2[]  3[]  UN[]     8 Sensory 0[x]  1[]  2[]  UN[]      9 Best Language 0[x]  1[]  2[]  3[]      10 Dysarthria 0[]  1[x]  2[]  UN[]      11 Extinct. and Inattention 0[x]  1[]  2[]       TOTAL: 10     ROS   Constitutional Denies weight loss, fever and chills.   HEENT Denies changes in vision and hearing.   Respiratory  Denies SOB and cough.   CV Denies palpitations and CP   GI Denies abdominal pain, nausea, vomiting and diarrhea.   GU Denies dysuria and urinary frequency.   MSK Denies myalgia and joint pain.   Skin Denies rash and pruritus.   Neurological Denies headache and syncope.   Psychiatric Denies recent changes in mood. Denies anxiety and depression.    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    Encounter for assessment of healthcare decision-making capacity    History of cervical fracture 12/24/2017   nondisplaced fracture lateral mass of C1 on the right on CT 12/24/17   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) 07/19/2017   Hypertension    Hypertensive emergency 05/31/2022   Ischemic stroke (HCC)  01/01/2020   subacute right middle cerebellar peduncle and pons infarction   Left-sided weakness 01/27/2022   Head CT with remote right occipital infarct which is consistent with old left-sided weakness   MDD (major depressive disorder), recurrent severe, 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   Normocytic anemia 02/09/2020   Opiate use    History of Suboxone Therapy until 05/2021   Polysubstance abuse (HCC) 07/19/2017   Prescription drug misadventures (Seroquel)    Past Surgical History:  Procedure Laterality Date   CESAREAN SECTION     Family History  Problem Relation Age of Onset   Hypertension Mother    CAD Mother 30       died of MI at age 78   Hypertension Father    Social History   Socioeconomic History   Marital status: Single    Spouse name: Not on file   Number of children: Not on file   Years of education: Not on file   Highest education level: Not on file   Occupational History   Not on file  Tobacco Use   Smoking status: Former    Packs/day: .5    Types: Cigarettes    Passive exposure: Current   Smokeless tobacco: Former  Building services engineer Use: Never used  Substance and Sexual Activity   Alcohol use: No   Drug use: Yes    Types: Marijuana, Cocaine   Sexual activity: Not on file  Other Topics Concern   Not on file  Social History Narrative   ** Merged History Encounter **       Social Determinants of Health   Financial Resource Strain: Not on file  Food Insecurity: Food Insecurity Present (08/04/2022)   Hunger Vital Sign    Worried About Running Out of Food in the Last Year: Sometimes true    Ran Out of Food in the Last Year: Sometimes true  Transportation Needs: Unmet Transportation Needs (08/04/2022)   PRAPARE - Administrator, Civil Service (Medical): Yes    Lack of Transportation (Non-Medical): Yes  Physical Activity: Not on file  Stress: Not on file  Social Connections: Not on file   Allergies  Allergen Reactions   Tomato Anaphylaxis    Pt reports this as an allergy, but has been eating ketchup and pizza on previous visits w/o any s/s of allergic reaction/anaphylaxis.    Beef-Derived Products     Pt does not eat red meat   Hydrocodone Itching   Latex Itching and Rash    Medications  (Not in a hospital admission)    Vitals   Vitals:   12/17/22 0600  Weight: 79.8 kg     Body mass index is 30.21 kg/m.  Physical Exam   General: Laying comfortably in bed; in no acute distress.  HENT: Normal oropharynx and mucosa. Normal external appearance of ears and nose.  Neck: Supple, no pain or tenderness  CV: No JVD. No peripheral edema.  Pulmonary: Symmetric Chest rise. Normal respiratory effort.  Abdomen: Soft to touch, non-tender.  Ext: No cyanosis, edema, or deformity  Skin: No rash. Normal palpation of skin.   Musculoskeletal: Normal digits and nails by inspection. No clubbing.   Neurologic  Examination  Mental status/Cognition: Alert, oriented to self, place, thinks its January, oriented to year, poor attention.  Speech/language: child like voice, somewha dysarthric and at times difficult to [facility], fluent, comprehension intact, object naming intact, repetition intact.  Cranial nerves:   CN II Pupils equal and reactive to  light, no VF deficits    CN III,IV,VI EOM intact, no gaze preference or deviation, no nystagmus    CN V normal sensation in V1, V2, and V3 segments bilaterally    CN VII no asymmetry, no nasolabial fold flattening    CN VIII normal hearing to speech    CN IX & X normal palatal elevation, no uvular deviation    CN XI 5/5 head turn and 5/5 shoulder shrug bilaterally    CN XII midline tongue protrusion    Motor:  Muscle bulk: normal, tone normal Mvmt Root Nerve  Muscle Right Left Comments  SA C5/6 Ax Deltoid 5 0   EF C5/6 Mc Biceps 5 0   EE C6/7/8 Rad Triceps 5 0   WF C6/7 Med FCR     WE C7/8 PIN ECU     F Ab C8/T1 U ADM/FDI 5 0   HF L1/2/3 Fem Illopsoas 5 0   KE L2/3/4 Fem Quad     DF L4/5 D Peron Tib Ant 5 0   PF S1/2 Tibial Grc/Sol 5 0    Sensation:  Light touch Intact throughout   Pin prick    Temperature    Vibration   Proprioception    Coordination/Complex Motor:  - Finger to Nose intact on the right. - Heel to shin intact on the right. - Rapid alternating movement intact on the right - Gait: deferred for patient safety.  Labs   CBC:  Recent Labs  Lab 12/12/22 0900 12/13/22 0145 12/17/22 0614  WBC 4.6 4.3  --   NEUTROABS 2.9  --   --   HGB 8.2* 8.1* 10.2*  HCT 28.2* 28.6* 30.0*  MCV 75.8* 76.3*  --   PLT 379 395  --     Basic Metabolic Panel:  Lab Results  Component Value Date   NA 139 12/17/2022   K 3.5 12/17/2022   CO2 23 12/13/2022   GLUCOSE 108 (H) 12/17/2022   BUN 12 12/17/2022   CREATININE 0.90 12/17/2022   CALCIUM 8.7 (L) 12/13/2022   GFRNONAA >60 12/13/2022   GFRAA >60 03/14/2020   Lipid Panel:  Lab  Results  Component Value Date   LDLCALC 77 04/01/2022   HgbA1c:  Lab Results  Component Value Date   HGBA1C 6.4 (H) 11/24/2022   Urine Drug Screen:     Component Value Date/Time   LABOPIA NONE DETECTED 12/13/2022 0143   COCAINSCRNUR POSITIVE (A) 12/13/2022 0143   LABBENZ NONE DETECTED 12/13/2022 0143   AMPHETMU NONE DETECTED 12/13/2022 0143   THCU POSITIVE (A) 12/13/2022 0143   LABBARB NONE DETECTED 12/13/2022 0143    Alcohol Level     Component Value Date/Time   ETH <10 12/13/2022 0145    CT Head without contrast(Personally reviewed): CTH was negative for a large hypodensity concerning for a large territory infarct or hyperdensity concerning for an ICH  Impression   Kaitlyn Gray is a 47 y.o. female with PMH significant for DM2, HTN, smoker 0.5ppd, prior strokes and functional neurologic symptom disorder, Bipolar 1, MDD, cocaine and cannabis use who presents with left sided weakness, child like demeanor. Neuro exam with child like voice, left arm falls to the side when held up above her head/face and let go. She has positive hoovers signs when testing leg strength. She was noted to be moving her LUE when she is distracted.  Overall, exam and presentation highly concerning for functional neurologic symptoms disorder. She has had prior known similar presentation.  A  bit after I left the room after code stroke, she stood up on her own, used her walker to walk out clearly moving all extremities.  Recommendations  - functional neurologic symptom disorder. No need for further imaging. Okay to discharge from neurology standpoint. ______________________________________________________________________   Thank you for the opportunity to take part in the care of this patient. If you have any further questions, please contact the neurology consultation attending.  Signed,  Erick Blinks [facility] _ _ _   _ __   _ __ _ _  __ __   _ __   __ _

## 2022-12-17 NOTE — ED Triage Notes (Signed)
Patient Kaitlyn Gray from home with complaint of code stroke.   Per EMS patient woke up this am and called 911 stating "I am starting to have a stroke"  EMS found patient with left sided weakness.   Patient was ambulatory to the EMS stretcher and able to move left arm when patient refused IV access.

## 2022-12-17 NOTE — ED Notes (Signed)
Upon patients arrival to Room 16 from CT patient reported that she was going home. Patient able to sit up in bed without assistance; patient used bilateral extremities to sit self up and scoot self to end of stretcher. Once patient at end of stretcher patient put her shoes on and used personal Renate Danh to leave ER.   RN unable to obtain vital signs prior to patient leaving.

## 2022-12-17 NOTE — ED Provider Notes (Signed)
[place] EMERGENCY DEPARTMENT AT [facility] Provider Note   CSN: [id number] Arrival date & time: 12/17/22  4098     History  No chief complaint on file.   Kaitlyn Gray is a 47 y.o. female.  HPI     This is a 47 year old female who presents as a code stroke.  Patient has a significant history of psychiatric disease including bipolar disorder, adjustment disorder, drug abuse, hypertension.  Noted by EMS to have left sided deficits.  Also with slurred speech.  On our evaluation, patient has slurred speech.  She does not appear to move her left upper extremity freely.  She answers questions intermittently.  Level 5 caveat Home Medications Prior to Admission medications   Medication Sig Start Date End Date Taking? Authorizing Provider  amLODipine (NORVASC) 10 MG tablet Take 1 tablet (10 mg total) by mouth daily. 06/04/22 12/14/22  [place], Eric J, DO  aspirin EC 81 MG tablet Take 81 mg by mouth daily. Swallow whole.    [provider]  ferrous sulfate 325 (65 FE) MG tablet Take 1 tablet (325 mg total) by mouth every other day. Patient not taking: Reported on 12/14/2022 06/04/22 11/20/22  [place], Alvira Philips, DO  gabapentin (NEURONTIN) 100 MG capsule Take 1 capsule (100 mg total) by mouth 3 (three) times daily for pain scale 4-7. Patient not taking: Reported on 12/14/2022 12/01/22     gabapentin (NEURONTIN) 300 MG capsule Take 1 capsule (300 mg total) by mouth at bedtime. 06/04/22 12/14/22  [place], Alvira Philips, DO  losartan (COZAAR) 100 MG tablet Take 1 tablet (100 mg total) by mouth daily. 06/04/22 12/14/22  [place], Alvira Philips, DO  metFORMIN (GLUCOPHAGE) 500 MG tablet Take 1 tablet (500 mg total) by mouth 2 (two) times daily with a meal. 06/04/22 12/14/22  [place], Alvira Philips, DO  nicotine polacrilex (NICORETTE) 2 MG gum Take 1 each (2 mg total) by mouth every 2 (two) hours as needed for Smoking Cravings Patient not taking: Reported on 12/14/2022 12/01/22     pantoprazole (PROTONIX) 40  MG tablet Take 1 tablet (40 mg total) by mouth daily. Patient taking differently: Take 40 mg by mouth daily as needed (Indigestion). 06/04/22 12/14/22  [place], Alvira Philips, DO  PARoxetine (PAXIL) 20 MG tablet Take 1 tablet (20 mg total) by mouth daily for depression. 12/14/22   Lenox Ponds, NP  QUEtiapine (SEROQUEL) 200 MG tablet Take 1 tablet (200 mg total) by mouth at bedtime. 12/14/22 03/14/23  Lenox Ponds, NP  rosuvastatin (CRESTOR) 40 MG tablet Take 1 tablet (40 mg total) by mouth daily. 06/04/22 12/14/22  [place], Eric J, DO      Allergies    Tomato, Beef-derived products, Hydrocodone, and Latex    Review of Systems   Review of Systems  Unable to perform ROS: Acuity of condition    Physical Exam Updated Vital Signs Wt 79.8 kg   LMP  (LMP Unknown)   BMI 30.21 kg/m  Physical Exam Vitals and nursing note reviewed.  Constitutional:      General: She is not in acute distress.    Appearance: Normal appearance. She is well-developed.  HENT:     Head: Normocephalic and atraumatic.     Mouth/Throat:     Mouth: Mucous membranes are moist.  Eyes:     Pupils: Pupils are equal, round, and reactive to light.  Cardiovascular:     Rate and Rhythm: Normal rate and regular rhythm.  Pulmonary:     Effort:  Pulmonary effort is normal. No respiratory distress.  Abdominal:     Palpations: Abdomen is soft.  Musculoskeletal:     Cervical back: Neck supple.     Right lower leg: No edema.     Left lower leg: No edema.  Skin:    General: Skin is warm and dry.  Neurological:     Mental Status: She is alert.     Comments: Oriented to person and place, not time, appears to have left upper extremity weakness although effort is questionable, patient avoids hitting her face when lifting of her left arm, aphasia or dysarthria     ED Results / Procedures / Treatments   Labs (all labs ordered are listed, but only abnormal results are displayed) Labs Reviewed  CBC - Abnormal; Notable for the  following components:      Result Value   RBC 3.81 (*)    Hemoglobin 8.4 (*)    HCT 29.3 (*)    MCV 76.9 (*)    MCH 22.0 (*)    MCHC 28.7 (*)    RDW 20.1 (*)    All other components within normal limits  POCT I-STAT, CHEM 8 - Abnormal; Notable for the following components:   Glucose, Bld 108 (*)    Hemoglobin 10.2 (*)    HCT 30.0 (*)    All other components within normal limits  DIFFERENTIAL  COMPREHENSIVE METABOLIC PANEL  ETHANOL  HCG, SERUM, QUALITATIVE  PROTIME-INR  APTT  I-STAT CHEM 8, ED  CBG MONITORING, ED    EKG None  Radiology CT HEAD CODE STROKE WO CONTRAST  Result Date: 12/17/2022 CLINICAL DATA:  Code stroke. Neuro deficit with acute stroke suspected left-sided deficits. EXAM: CT HEAD WITHOUT CONTRAST TECHNIQUE: Contiguous axial images were obtained from the base of the skull through the vertex without intravenous contrast. RADIATION DOSE REDUCTION: This exam was performed according to the departmental dose-optimization program which includes automated exposure control, adjustment of the mA and/or kV according to patient size and/or use of iterative reconstruction technique. COMPARISON:  Head CT 12/06/2022 FINDINGS: Brain: No evidence of acute infarction, hemorrhage, hydrocephalus, extra-axial collection or mass lesion/mass effect. Chronic right occipital and left superior frontal parietal cortex infarcts. Chronic low-density in the cerebral white matter with encephalomalacia in the right brachium pontis. Vascular: No hyperdense vessel or unexpected calcification. Skull: Normal. Negative for fracture or focal lesion. Sinuses/Orbits: No acute finding. Other: These results were communicated to Dr. Derry Lory at 6:20 am on 12/17/2022 by epic chat. ASPECTS [facility] Stroke Program Early CT Score) - Ganglionic level infarction (caudate, lentiform nuclei, internal capsule, insula, M1-M3 cortex): 7 - Supraganglionic infarction (M4-M6 cortex): 3 Total score (0-10 with 10 being normal):  10 IMPRESSION: 1. No acute finding. 2. Chronic white matter disease and cortical infarcts. Electronically Signed   By: Tiburcio Pea M.D.   On: 12/17/2022 06:22    Procedures Procedures    Medications Ordered in ED Medications  sodium chloride flush (NS) 0.9 % injection 3 mL (has no administration in time range)    ED Course/ Medical Decision Making/ A&P                             Medical Decision Making Amount and/or Complexity of Data Reviewed Labs: ordered. Radiology: ordered.   This patient presents to the ED for concern of stroke, this involves an extensive number of treatment options, and is a complaint that carries with it a high risk of  complications and morbidity.  I considered the following differential and admission for this acute, potentially life threatening condition.  The differential diagnosis includes acute stroke, TIA, psychiatric, seizure  MDM:    This is a 47 year old female who presents as a code stroke.  Her neuroexam is inconsistent at best.  She is well-known to our neurology service.  She is awake, alert, ABCs are intact.  She was taken emergently to the scanner after a cursory neurologic exam.  Neurology fully evaluated the patient and the CT imaging.  Feels that this is likely functional as the patient has presented multiple times similarly in the past.  Before I could reevaluate the patient, patient indicated that she was leaving.  She was told that she did not likely have a stroke and at that point stated that she was going home.  She stood up on her own.  I watched her remove all of her leads and used bilateral upper extremities equally.  She refused to put on a gown or be hooked up to the monitor.  She was adamant about going home.  She ambulated at her baseline with her walker.  Patient left AGAINST MEDICAL ADVICE.  Highly suspect psychiatric etiology.  (Labs, imaging, consults)  Labs: I Ordered, and personally interpreted labs.  The pertinent results  include: stroke labs pending  Imaging Studies ordered: I ordered imaging studies including CT head I independently visualized and interpreted imaging. I agree with the radiologist interpretation  Additional history obtained from chart review, EMS.  External records from outside source obtained and reviewed including prior admissions  Cardiac Monitoring: The patient was maintained on a cardiac monitor.  If on the cardiac monitor, I personally viewed and interpreted the cardiac monitored which showed an underlying rhythm of: Sinus rhythm  Reevaluation: After the interventions noted above, I reevaluated the patient and found that they have :improved  Social Determinants of Health:  lives independently, multiple comorbid conditions  Disposition: AMA  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    Encounter for assessment of healthcare decision-making capacity    History of cervical fracture 12/24/2017   nondisplaced fracture lateral mass of C1 on the right on CT 12/24/17   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) 07/19/2017   Hypertension    Hypertensive emergency 05/31/2022   Ischemic stroke (HCC) 01/01/2020   subacute right middle cerebellar peduncle and pons infarction   Left-sided weakness 01/27/2022   Head CT with remote right occipital infarct which is consistent with old left-sided weakness   MDD (major depressive disorder), recurrent severe, 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   Normocytic anemia 02/09/2020   Opiate use    History of Suboxone Therapy until 05/2021   Polysubstance abuse (HCC) 07/19/2017   Prescription drug  misadventures (Seroquel)      Medicines Meds ordered this encounter  Medications   sodium chloride flush (NS) 0.9 % injection 3 mL    I have reviewed the patients home medicines and have made adjustments as needed  Problem List / ED Course: Problem List Items Addressed This Visit   None               Final  Clinical Impression(s) / ED Diagnoses Final diagnoses:  None    Rx / DC Orders ED Discharge Orders     None         Timberlynn Kizziah, Mayer Masker, MD 12/17/22 [phone]

## 2022-12-17 NOTE — Code Documentation (Addendum)
Responded to Code Stroke called at 0553 for L sided weakness/numbness, [id number]. Pt arrived at 0606, CBG-108, NIH-9, CT head negative for acute changes. Code Stroke cancelled at [phone]. Once back in ED room, pt decided she wanted to go home, got out of bed using all extremities, and walked out of ED using home walker.

## 2022-12-20 ENCOUNTER — Encounter (HOSPITAL_COMMUNITY): Payer: Self-pay

## 2022-12-20 ENCOUNTER — Emergency Department (HOSPITAL_COMMUNITY)
Admission: EM | Admit: 2022-12-20 | Discharge: 2022-12-21 | Disposition: A | Discharge status: Home / Self Care | Payer: MEDICAID | Attending: Emergency Medicine | Admitting: Emergency Medicine

## 2022-12-20 ENCOUNTER — Other Ambulatory Visit: Payer: Self-pay

## 2022-12-20 DIAGNOSIS — I69354 Hemiplegia and hemiparesis following cerebral infarction affecting left non-dominant side: Secondary | ICD-10-CM | POA: Diagnosis not present

## 2022-12-20 DIAGNOSIS — I10 Essential (primary) hypertension: Secondary | ICD-10-CM | POA: Diagnosis not present

## 2022-12-20 DIAGNOSIS — Z91040 Latex allergy status: Secondary | ICD-10-CM | POA: Diagnosis not present

## 2022-12-20 DIAGNOSIS — D649 Anemia, unspecified: Secondary | ICD-10-CM | POA: Insufficient documentation

## 2022-12-20 DIAGNOSIS — E876 Hypokalemia: Secondary | ICD-10-CM | POA: Diagnosis not present

## 2022-12-20 DIAGNOSIS — Z7982 Long term (current) use of aspirin: Secondary | ICD-10-CM | POA: Insufficient documentation

## 2022-12-20 DIAGNOSIS — R45851 Suicidal ideations: Secondary | ICD-10-CM | POA: Diagnosis not present

## 2022-12-20 DIAGNOSIS — R531 Weakness: Secondary | ICD-10-CM | POA: Insufficient documentation

## 2022-12-20 DIAGNOSIS — Z79899 Other long term (current) drug therapy: Secondary | ICD-10-CM | POA: Insufficient documentation

## 2022-12-20 DIAGNOSIS — Z8673 Personal history of transient ischemic attack (TIA), and cerebral infarction without residual deficits: Secondary | ICD-10-CM | POA: Insufficient documentation

## 2022-12-20 LAB — COMPREHENSIVE METABOLIC PANEL
ALT: 12 U/L (ref 0–44)
AST: 16 U/L (ref 15–41)
Albumin: 3.1 g/dL — ABNORMAL LOW (ref 3.5–5.0)
Alkaline Phosphatase: 56 U/L (ref 38–126)
Anion gap: 13 (ref 5–15)
BUN: 21 mg/dL — ABNORMAL HIGH (ref 6–20)
CO2: 24 mmol/L (ref 22–32)
Calcium: 8.9 mg/dL (ref 8.9–10.3)
Chloride: 106 mmol/L (ref 98–111)
Creatinine, Ser: 1.08 mg/dL — ABNORMAL HIGH (ref 0.44–1.00)
GFR, Estimated: >60 mL/min (ref >60)
Glucose, Bld: 101 mg/dL — ABNORMAL HIGH (ref 70–99)
Potassium: 3.1 mmol/L — ABNORMAL LOW (ref 3.5–5.1)
Sodium: 143 mmol/L (ref 135–145)
Total Bilirubin: 0.7 mg/dL (ref 0.3–1.2)
Total Protein: 6.3 g/dL — ABNORMAL LOW (ref 6.5–8.1)

## 2022-12-20 LAB — CBC WITH DIFFERENTIAL/PLATELET
Abs Immature Granulocytes: 0.01 10*3/uL (ref 0.00–0.07)
Basophils Absolute: 0 10*3/uL (ref 0.0–0.1)
Basophils Relative: 1 %
Eosinophils Absolute: 0.1 10*3/uL (ref 0.0–0.5)
Eosinophils Relative: 2 %
HCT: 28.9 % — ABNORMAL LOW (ref 36.0–46.0)
Hemoglobin: 8.4 g/dL — ABNORMAL LOW (ref 12.0–15.0)
Immature Granulocytes: 0 %
Lymphocytes Relative: 25 %
Lymphs Abs: 1.4 10*3/uL (ref 0.7–4.0)
MCH: 22 pg — ABNORMAL LOW (ref 26.0–34.0)
MCHC: 29.1 g/dL — ABNORMAL LOW (ref 30.0–36.0)
MCV: 75.9 fL — ABNORMAL LOW (ref 80.0–100.0)
Monocytes Absolute: 0.3 10*3/uL (ref 0.1–1.0)
Monocytes Relative: 6 %
Neutro Abs: 3.6 10*3/uL (ref 1.7–7.7)
Neutrophils Relative %: 66 %
Platelets: 289 10*3/uL (ref 150–400)
RBC: 3.81 MIL/uL — ABNORMAL LOW (ref 3.87–5.11)
RDW: 20.1 % — ABNORMAL HIGH (ref 11.5–15.5)
WBC: 5.5 10*3/uL (ref 4.0–10.5)
nRBC: 0 % (ref 0.0–0.2)

## 2022-12-20 LAB — ACETAMINOPHEN LEVEL: Acetaminophen (Tylenol), Serum: <10 ug/mL — ABNORMAL LOW (ref 10–30)

## 2022-12-20 LAB — HCG, SERUM, QUALITATIVE: Preg, Serum: NEGATIVE

## 2022-12-20 LAB — SALICYLATE LEVEL: Salicylate Lvl: <7 mg/dL — ABNORMAL LOW (ref 7.0–30.0)

## 2022-12-20 LAB — CBG MONITORING, ED: Glucose-Capillary: 91 mg/dL (ref 70–99)

## 2022-12-20 LAB — ETHANOL: Alcohol, Ethyl (B): <10 mg/dL (ref <10)

## 2022-12-20 MED ORDER — POTASSIUM CHLORIDE CRYS ER 20 MEQ PO TBCR
40.0000 meq | EXTENDED_RELEASE_TABLET | Freq: Once | ORAL | Status: AC
  Administered 2022-12-20: 40 meq via ORAL
  Filled 2022-12-20: qty 2

## 2022-12-20 MED ORDER — SODIUM CHLORIDE 0.9 % IV BOLUS
1000.0000 mL | Freq: Once | INTRAVENOUS | Status: AC
  Administered 2022-12-20: 1000 mL via INTRAVENOUS

## 2022-12-20 NOTE — ED Triage Notes (Signed)
Patient BIB EMS from home due to weakness and malaise. Patient states this started today. EMS reports air conditioner in house going out today. Patient states hasn't drank or ate today. Patient A&Ox4.

## 2022-12-20 NOTE — ED Notes (Addendum)
Patient states she is unable to provide a urine sample at this time.

## 2022-12-20 NOTE — ED Provider Notes (Signed)
[facility]Y DEPT [address] [facility] Emergency Department Provider Note MRN:  [id number]  Arrival date & time: 12/21/22     Chief Complaint   Weakness   History of Present Illness   Kaitlyn Gray is a 47 y.o. 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  Many of her symptoms are thought to be psychogenic.  She refuses to give much history, but does ask repeatedly for water and did say that she had been using "all of the drugs."  History provided by patient.   Review of Systems  Pertinent positive and negative review of systems noted in HPI.    Physical Exam   Vitals:   12/20/22 2202 12/20/22 2204  BP:  (!) 150/77  Pulse:  82  Resp:  12  Temp:  98.7 F (37.1 C)  SpO2: 98% 100%    CONSTITUTIONAL:  non toxic-appearing, NAD NEURO:  Alert and oriented x 3, CN 3-12 grossly intact EYES:  eyes equal and reactive ENT/NECK:  Supple, no stridor  CARDIO:  normal rate, regular rhythm, appears well-perfused  PULM:  No respiratory distress, CTAB GI/GU:  non-distended,  MSK/SPINE:  No gross deformities, no edema, moves all extremities  SKIN:  no rash, atraumatic   *Additional and/or pertinent findings included in MDM below  Diagnostic and Interventional Summary    EKG Interpretation Date/Time:    Ventricular Rate:    PR Interval:    QRS Duration:    QT Interval:    QTC Calculation:   R Axis:      Text Interpretation:         Labs Reviewed  COMPREHENSIVE METABOLIC PANEL - Abnormal; Notable for the following components:      Result Value   Potassium 3.1 (*)    Glucose, Bld 101 (*)    BUN 21 (*)    Creatinine, Ser 1.08 (*)    Total Protein 6.3 (*)    Albumin 3.1 (*)    All other components within normal limits  SALICYLATE LEVEL - Abnormal; Notable for the following components:   Salicylate Lvl <7.0 (*)     All other components within normal limits  ACETAMINOPHEN LEVEL - Abnormal; Notable for the following components:   Acetaminophen (Tylenol), Serum <10 (*)    All other components within normal limits  CBC WITH DIFFERENTIAL/PLATELET - Abnormal; Notable for the following components:   RBC 3.81 (*)    Hemoglobin 8.4 (*)    HCT 28.9 (*)    MCV 75.9 (*)    MCH 22.0 (*)    MCHC 29.1 (*)    RDW 20.1 (*)    All other components within normal limits  ETHANOL  HCG, SERUM, QUALITATIVE  RAPID URINE DRUG SCREEN, HOSP PERFORMED  URINALYSIS, ROUTINE W REFLEX MICROSCOPIC  CBG MONITORING, ED  CBG MONITORING, ED    No orders to display    Medications  sodium chloride 0.9 % bolus 1,000 mL (0 mLs Intravenous Stopped 12/20/22 2336)  potassium chloride SA (KLOR-CON M) CR tablet 40 mEq (40 mEq Oral Given 12/20/22 2334)     Procedures  /  Critical Care Procedures  ED Course and Medical Decision Making  I have reviewed the triage vital signs, the nursing notes, and pertinent available records from the EMR.  Social Determinants Affecting Complexity of Care: Patient has no clinically significant social determinants affecting  this chief complaint..   ED Course:    Medical Decision Making Patient here with frequent visits to the ED.  Complaining of being thirsty.  Will check labs and reassess.  Workup notable for mild hypokalemia, repleted in ED. Baseline anemia.  Vitals are stable.  Patient appears stable for discharge.  No further emergent workup indicated.  Amount and/or Complexity of Data Reviewed Labs: ordered.  Risk Prescription drug management.         Consultants: No consultations were needed in caring for this patient.   Treatment and Plan: Emergency department workup does not suggest an emergent condition requiring admission or immediate intervention beyond  what has been performed at this time. The patient is safe for discharge and has  been instructed to return immediately  for worsening symptoms, change in  symptoms or any other concerns    Final Clinical Impressions(s) / ED Diagnoses     ICD-10-CM   1. Weakness  R53.1     2. Hypokalemia  E87.6     3. Anemia, unspecified type  D64.9       ED Discharge Orders     None         Discharge Instructions Discussed with and Provided to Patient:   Discharge Instructions   None      Roxy Horseman, PA-C 12/21/22 0036    Pricilla Loveless, MD 12/21/22 2212

## 2022-12-21 ENCOUNTER — Encounter (HOSPITAL_COMMUNITY): Payer: Self-pay

## 2022-12-21 ENCOUNTER — Emergency Department (HOSPITAL_COMMUNITY)
Admission: EM | Admit: 2022-12-21 | Discharge: 2022-12-22 | Disposition: A | Discharge status: Home / Self Care | Payer: MEDICAID | Source: Home / Self Care | Attending: Emergency Medicine | Admitting: Emergency Medicine

## 2022-12-21 ENCOUNTER — Other Ambulatory Visit: Payer: Self-pay

## 2022-12-21 DIAGNOSIS — Z79899 Other long term (current) drug therapy: Secondary | ICD-10-CM | POA: Insufficient documentation

## 2022-12-21 DIAGNOSIS — Z91040 Latex allergy status: Secondary | ICD-10-CM | POA: Insufficient documentation

## 2022-12-21 DIAGNOSIS — R531 Weakness: Secondary | ICD-10-CM | POA: Insufficient documentation

## 2022-12-21 DIAGNOSIS — F319 Bipolar disorder, unspecified: Secondary | ICD-10-CM

## 2022-12-21 DIAGNOSIS — D649 Anemia, unspecified: Secondary | ICD-10-CM | POA: Insufficient documentation

## 2022-12-21 DIAGNOSIS — Z7982 Long term (current) use of aspirin: Secondary | ICD-10-CM | POA: Insufficient documentation

## 2022-12-21 DIAGNOSIS — Z8673 Personal history of transient ischemic attack (TIA), and cerebral infarction without residual deficits: Secondary | ICD-10-CM | POA: Insufficient documentation

## 2022-12-21 DIAGNOSIS — R45851 Suicidal ideations: Secondary | ICD-10-CM | POA: Insufficient documentation

## 2022-12-21 DIAGNOSIS — I10 Essential (primary) hypertension: Secondary | ICD-10-CM | POA: Insufficient documentation

## 2022-12-21 DIAGNOSIS — E876 Hypokalemia: Secondary | ICD-10-CM

## 2022-12-21 LAB — URINALYSIS, ROUTINE W REFLEX MICROSCOPIC
Bilirubin Urine: NEGATIVE
Glucose, UA: NEGATIVE mg/dL
Hgb urine dipstick: NEGATIVE
Ketones, ur: NEGATIVE mg/dL
Leukocytes,Ua: NEGATIVE
Nitrite: NEGATIVE
Protein, ur: NEGATIVE mg/dL
Specific Gravity, Urine: 1.014 (ref 1.005–1.030)
pH: 5 (ref 5.0–8.0)

## 2022-12-21 NOTE — ED Notes (Signed)
Patient refusing to give urine sample after multiple times of asking.

## 2022-12-21 NOTE — ED Provider Notes (Incomplete)
[place] EMERGENCY DEPARTMENT AT [facility] Provider Note   CSN: [id number] Arrival date & time: 12/21/22  2300     History {Add pertinent medical, surgical, social history, OB history to HPI:1} Chief Complaint  Patient presents with   Suicidal    Kaitlyn Gray is a 47 y.o. female.  Patient with a history of bipolar disorder, hypertension, previous stroke with left-sided weakness,She needs to get your impression, polysubstance abuse presenting with suicidal thoughts for the past 2 days.  She states she wants to go to sleep and not wake up.  She has been waiting on her Social Security check.  She has no money, no food, poor housing situation.  She is hearing voices with plan to hurt herself without significant plan.  States voices telling her to hurt herself.  Does not want her anybody else.  She is hearing voices.  Denies any alcohol or drug abuse.  Has left-sided weakness which is at baseline and unchanged.  The history is provided by the patient.       Home Medications Prior to Admission medications   Medication Sig Start Date End Date Taking? Authorizing Provider  amLODipine (NORVASC) 10 MG tablet Take 1 tablet (10 mg total) by mouth daily. 06/04/22 12/14/22  [place], Eric J, DO  aspirin EC 81 MG tablet Take 81 mg by mouth daily. Swallow whole.    [provider]  ferrous sulfate 325 (65 FE) MG tablet Take 1 tablet (325 mg total) by mouth every other day. Patient not taking: Reported on 12/14/2022 06/04/22 11/20/22  [place], Alvira Philips, DO  gabapentin (NEURONTIN) 100 MG capsule Take 1 capsule (100 mg total) by mouth 3 (three) times daily for pain scale 4-7. Patient not taking: Reported on 12/14/2022 12/01/22     gabapentin (NEURONTIN) 300 MG capsule Take 1 capsule (300 mg total) by mouth at bedtime. 06/04/22 12/14/22  [place], Alvira Philips, DO  losartan (COZAAR) 100 MG tablet Take 1 tablet (100 mg total) by mouth daily. 06/04/22 12/14/22  [place], Alvira Philips, DO  metFORMIN  (GLUCOPHAGE) 500 MG tablet Take 1 tablet (500 mg total) by mouth 2 (two) times daily with a meal. 06/04/22 12/14/22  [place], Alvira Philips, DO  nicotine polacrilex (NICORETTE) 2 MG gum Take 1 each (2 mg total) by mouth every 2 (two) hours as needed for Smoking Cravings Patient not taking: Reported on 12/14/2022 12/01/22     pantoprazole (PROTONIX) 40 MG tablet Take 1 tablet (40 mg total) by mouth daily. Patient taking differently: Take 40 mg by mouth daily as needed (Indigestion). 06/04/22 12/14/22  [place], Alvira Philips, DO  PARoxetine (PAXIL) 20 MG tablet Take 1 tablet (20 mg total) by mouth daily for depression. 12/14/22   Lenox Ponds, NP  QUEtiapine (SEROQUEL) 200 MG tablet Take 1 tablet (200 mg total) by mouth at bedtime. 12/14/22 03/14/23  Lenox Ponds, NP  rosuvastatin (CRESTOR) 40 MG tablet Take 1 tablet (40 mg total) by mouth daily. 06/04/22 12/14/22  [place], Eric J, DO      Allergies    Tomato, Beef-derived products, Hydrocodone, and Latex    Review of Systems   Review of Systems  Constitutional:  Negative for activity change, appetite change and fever.  HENT:  Negative for congestion and rhinorrhea.   Respiratory:  Negative for cough, chest tightness and shortness of breath.   Cardiovascular:  Negative for chest pain.  Gastrointestinal:  Negative for abdominal pain, nausea, rectal pain and vomiting.  Genitourinary:  Negative for  dysuria and hematuria.  Musculoskeletal:  Negative for arthralgias and myalgias.  Skin:  Negative for wound.  Neurological:  Negative for dizziness, weakness and headaches.  Psychiatric/Behavioral:  Positive for confusion, decreased concentration, self-injury, sleep disturbance and suicidal ideas. The patient is nervous/anxious.    all other systems are negative except as noted in the HPI and PMH.    Physical Exam Updated Vital Signs BP (!) 155/77 (BP Location: Right Arm)   Pulse 65   Temp (!) 97.5 F (36.4 C) (Oral)   Resp 18   Ht 5\' 3"  (1.6 m)   Wt 81.6  kg   LMP  (LMP Unknown)   SpO2 100%   BMI 31.89 kg/m  Physical Exam Vitals and nursing note reviewed.  Constitutional:      General: She is not in acute distress.    Appearance: She is well-developed.  HENT:     Head: Normocephalic and atraumatic.     Mouth/Throat:     Pharynx: No oropharyngeal exudate.  Eyes:     Conjunctiva/sclera: Conjunctivae normal.     Pupils: Pupils are equal, round, and reactive to light.  Neck:     Comments: No meningismus. Cardiovascular:     Rate and Rhythm: Normal rate and regular rhythm.     Heart sounds: Normal heart sounds. No murmur heard. Pulmonary:     Effort: Pulmonary effort is normal. No respiratory distress.     Breath sounds: Normal breath sounds.  Abdominal:     Palpations: Abdomen is soft.     Tenderness: There is no abdominal tenderness. There is no guarding or rebound.  Musculoskeletal:        General: No tenderness. Normal range of motion.     Cervical back: Normal range of motion and neck supple.  Skin:    General: Skin is warm.  Neurological:     Mental Status: She is alert and oriented to person, place, and time.     Cranial Nerves: No cranial nerve deficit.     Motor: No abnormal muscle tone.     Coordination: Coordination normal.     Comments: Left-sided weakness at baseline.  5/5 strength on the right.  Slurred speech at baseline.  Psychiatric:        Behavior: Behavior normal.     ED Results / Procedures / Treatments   Labs (all labs ordered are listed, but only abnormal results are displayed) Labs Reviewed  CBC WITH DIFFERENTIAL/PLATELET  COMPREHENSIVE METABOLIC PANEL  ETHANOL  ACETAMINOPHEN LEVEL  SALICYLATE LEVEL  RAPID URINE DRUG SCREEN, HOSP PERFORMED  URINALYSIS, ROUTINE W REFLEX MICROSCOPIC  PREGNANCY, URINE    EKG None  Radiology No results found.  Procedures Procedures  {Document cardiac monitor, telemetry assessment procedure when appropriate:1}  Medications Ordered in ED Medications -  No data to display  ED Course/ Medical Decision Making/ A&P   {   Click here for ABCD2, HEART and other calculatorsREFRESH Note before signing :1}                          Medical Decision Making Amount and/or Complexity of Data Reviewed Labs: ordered. Decision-making details documented in ED Course. Radiology: ordered and independent interpretation performed. Decision-making details documented in ED Course. ECG/medicine tests: ordered and independent interpretation performed. Decision-making details documented in ED Course.   Suicidal thoughts with plan to hurt self and hearing voices.  Vital stable, no distress.  Left-sided weakness at baseline.  Will screening labs and  involve TTS  {Document critical care time when appropriate:1} {Document review of labs and clinical decision tools ie heart score, Chads2Vasc2 etc:1}  {Document your independent review of radiology images, and any outside records:1} {Document your discussion with family members, caretakers, and with consultants:1} {Document social determinants of health affecting pt's care:1} {Document your decision making why or why not admission, treatments were needed:1} Final Clinical Impression(s) / ED Diagnoses Final diagnoses:  None    Rx / DC Orders ED Discharge Orders     None

## 2022-12-21 NOTE — ED Triage Notes (Addendum)
Pt arrived via [employer] she stated she has been waiting on her social security check, she has no money, no food, no AC, house full of bugs and maggots. Pt stated "I am tired of everything and I want to go to sleep and not wake again."

## 2022-12-22 ENCOUNTER — Emergency Department (HOSPITAL_COMMUNITY)
Admission: EM | Admit: 2022-12-22 | Discharge: 2022-12-23 | Disposition: A | Discharge status: Home / Self Care | Payer: MEDICAID | Attending: Emergency Medicine | Admitting: Emergency Medicine

## 2022-12-22 ENCOUNTER — Other Ambulatory Visit: Payer: Self-pay

## 2022-12-22 ENCOUNTER — Encounter (HOSPITAL_COMMUNITY): Payer: Self-pay | Admitting: Registered Nurse

## 2022-12-22 ENCOUNTER — Encounter (HOSPITAL_COMMUNITY): Payer: Self-pay

## 2022-12-22 ENCOUNTER — Ambulatory Visit (HOSPITAL_COMMUNITY)
Admission: EM | Admit: 2022-12-22 | Discharge: 2022-12-22 | Disposition: A | Discharge status: Home / Self Care | Payer: MEDICAID | Attending: Registered Nurse | Admitting: Registered Nurse

## 2022-12-22 DIAGNOSIS — Z91040 Latex allergy status: Secondary | ICD-10-CM | POA: Insufficient documentation

## 2022-12-22 DIAGNOSIS — Z5982 Transportation insecurity: Secondary | ICD-10-CM | POA: Diagnosis present

## 2022-12-22 DIAGNOSIS — Z7982 Long term (current) use of aspirin: Secondary | ICD-10-CM | POA: Diagnosis not present

## 2022-12-22 DIAGNOSIS — F17210 Nicotine dependence, cigarettes, uncomplicated: Secondary | ICD-10-CM | POA: Diagnosis not present

## 2022-12-22 DIAGNOSIS — F329 Major depressive disorder, single episode, unspecified: Secondary | ICD-10-CM | POA: Insufficient documentation

## 2022-12-22 DIAGNOSIS — F1910 Other psychoactive substance abuse, uncomplicated: Secondary | ICD-10-CM | POA: Insufficient documentation

## 2022-12-22 DIAGNOSIS — Z7984 Long term (current) use of oral hypoglycemic drugs: Secondary | ICD-10-CM | POA: Insufficient documentation

## 2022-12-22 DIAGNOSIS — I10 Essential (primary) hypertension: Secondary | ICD-10-CM | POA: Insufficient documentation

## 2022-12-22 DIAGNOSIS — R45851 Suicidal ideations: Secondary | ICD-10-CM

## 2022-12-22 DIAGNOSIS — E119 Type 2 diabetes mellitus without complications: Secondary | ICD-10-CM | POA: Insufficient documentation

## 2022-12-22 DIAGNOSIS — Z79899 Other long term (current) drug therapy: Secondary | ICD-10-CM | POA: Insufficient documentation

## 2022-12-22 DIAGNOSIS — F3132 Bipolar disorder, current episode depressed, moderate: Secondary | ICD-10-CM | POA: Insufficient documentation

## 2022-12-22 DIAGNOSIS — F29 Unspecified psychosis not due to a substance or known physiological condition: Secondary | ICD-10-CM | POA: Diagnosis not present

## 2022-12-22 DIAGNOSIS — R451 Restlessness and agitation: Secondary | ICD-10-CM | POA: Diagnosis present

## 2022-12-22 DIAGNOSIS — Z765 Malingerer [conscious simulation]: Secondary | ICD-10-CM

## 2022-12-22 LAB — COMPREHENSIVE METABOLIC PANEL
ALT: 11 U/L (ref 0–44)
ALT: 13 U/L (ref 0–44)
AST: 11 U/L — ABNORMAL LOW (ref 15–41)
AST: 15 U/L (ref 15–41)
Albumin: 3 g/dL — ABNORMAL LOW (ref 3.5–5.0)
Albumin: 3.2 g/dL — ABNORMAL LOW (ref 3.5–5.0)
Alkaline Phosphatase: 53 U/L (ref 38–126)
Alkaline Phosphatase: 59 U/L (ref 38–126)
Anion gap: 7 (ref 5–15)
Anion gap: 8 (ref 5–15)
BUN: 17 mg/dL (ref 6–20)
BUN: 17 mg/dL (ref 6–20)
CO2: 25 mmol/L (ref 22–32)
CO2: 23 mmol/L (ref 22–32)
Calcium: 8.5 mg/dL — ABNORMAL LOW (ref 8.9–10.3)
Calcium: 9.2 mg/dL (ref 8.9–10.3)
Chloride: 106 mmol/L (ref 98–111)
Chloride: 107 mmol/L (ref 98–111)
Creatinine, Ser: 0.92 mg/dL (ref 0.44–1.00)
Creatinine, Ser: 0.9 mg/dL (ref 0.44–1.00)
GFR, Estimated: >60 mL/min (ref >60)
GFR, Estimated: >60 mL/min (ref >60)
Glucose, Bld: 147 mg/dL — ABNORMAL HIGH (ref 70–99)
Glucose, Bld: 102 mg/dL — ABNORMAL HIGH (ref 70–99)
Potassium: 2.9 mmol/L — ABNORMAL LOW (ref 3.5–5.1)
Potassium: 3.4 mmol/L — ABNORMAL LOW (ref 3.5–5.1)
Sodium: 138 mmol/L (ref 135–145)
Sodium: 138 mmol/L (ref 135–145)
Total Bilirubin: 0.1 mg/dL — ABNORMAL LOW (ref 0.3–1.2)
Total Bilirubin: 0.2 mg/dL — ABNORMAL LOW (ref 0.3–1.2)
Total Protein: 6.3 g/dL — ABNORMAL LOW (ref 6.5–8.1)
Total Protein: 7.1 g/dL (ref 6.5–8.1)

## 2022-12-22 LAB — ETHANOL
Alcohol, Ethyl (B): <10 mg/dL (ref <10)
Alcohol, Ethyl (B): <10 mg/dL (ref <10)

## 2022-12-22 LAB — CBC
HCT: 30.3 % — ABNORMAL LOW (ref 36.0–46.0)
Hemoglobin: 8.8 g/dL — ABNORMAL LOW (ref 12.0–15.0)
MCH: 21.9 pg — ABNORMAL LOW (ref 26.0–34.0)
MCHC: 29 g/dL — ABNORMAL LOW (ref 30.0–36.0)
MCV: 75.6 fL — ABNORMAL LOW (ref 80.0–100.0)
Platelets: 297 10*3/uL (ref 150–400)
RBC: 4.01 MIL/uL (ref 3.87–5.11)
RDW: 20.2 % — ABNORMAL HIGH (ref 11.5–15.5)
WBC: 3.2 10*3/uL — ABNORMAL LOW (ref 4.0–10.5)
nRBC: 0 % (ref 0.0–0.2)

## 2022-12-22 LAB — CBC WITH DIFFERENTIAL/PLATELET
Abs Immature Granulocytes: 0.01 10*3/uL (ref 0.00–0.07)
Basophils Absolute: 0 10*3/uL (ref 0.0–0.1)
Basophils Relative: 1 %
Eosinophils Absolute: 0.1 10*3/uL (ref 0.0–0.5)
Eosinophils Relative: 3 %
HCT: 27.6 % — ABNORMAL LOW (ref 36.0–46.0)
Hemoglobin: 8.1 g/dL — ABNORMAL LOW (ref 12.0–15.0)
Immature Granulocytes: 0 %
Lymphocytes Relative: 34 %
Lymphs Abs: 1.3 10*3/uL (ref 0.7–4.0)
MCH: 22.5 pg — ABNORMAL LOW (ref 26.0–34.0)
MCHC: 29.3 g/dL — ABNORMAL LOW (ref 30.0–36.0)
MCV: 76.7 fL — ABNORMAL LOW (ref 80.0–100.0)
Monocytes Absolute: 0.3 10*3/uL (ref 0.1–1.0)
Monocytes Relative: 7 %
Neutro Abs: 2.1 10*3/uL (ref 1.7–7.7)
Neutrophils Relative %: 55 %
Platelets: 294 10*3/uL (ref 150–400)
RBC: 3.6 MIL/uL — ABNORMAL LOW (ref 3.87–5.11)
RDW: 20.2 % — ABNORMAL HIGH (ref 11.5–15.5)
WBC: 3.8 10*3/uL — ABNORMAL LOW (ref 4.0–10.5)
nRBC: 0 % (ref 0.0–0.2)

## 2022-12-22 LAB — SALICYLATE LEVEL
Salicylate Lvl: <7 mg/dL — ABNORMAL LOW (ref 7.0–30.0)
Salicylate Lvl: <7 mg/dL — ABNORMAL LOW (ref 7.0–30.0)

## 2022-12-22 LAB — ACETAMINOPHEN LEVEL
Acetaminophen (Tylenol), Serum: <10 ug/mL — ABNORMAL LOW (ref 10–30)
Acetaminophen (Tylenol), Serum: <10 ug/mL — ABNORMAL LOW (ref 10–30)

## 2022-12-22 LAB — HCG, SERUM, QUALITATIVE: Preg, Serum: NEGATIVE

## 2022-12-22 LAB — CBG MONITORING, ED: Glucose-Capillary: 131 mg/dL — ABNORMAL HIGH (ref 70–99)

## 2022-12-22 MED ORDER — POTASSIUM CHLORIDE CRYS ER 20 MEQ PO TBCR: 20.0000 meq | EXTENDED_RELEASE_TABLET | Freq: Two times a day (BID) | ORAL | 0 refills | Status: DC

## 2022-12-22 MED ORDER — GABAPENTIN 300 MG PO CAPS: 300.0000 mg | ORAL_CAPSULE | Freq: Every day | ORAL | Status: DC

## 2022-12-22 MED ORDER — PANTOPRAZOLE SODIUM 40 MG PO TBEC: 40.0000 mg | DELAYED_RELEASE_TABLET | Freq: Every day | ORAL | Status: DC

## 2022-12-22 MED ORDER — AMLODIPINE BESYLATE 5 MG PO TABS: 10.0000 mg | ORAL_TABLET | Freq: Every day | ORAL | Status: DC

## 2022-12-22 MED ORDER — METFORMIN HCL 500 MG PO TABS: 500.0000 mg | ORAL_TABLET | Freq: Two times a day (BID) | ORAL | Status: DC

## 2022-12-22 MED ORDER — POTASSIUM CHLORIDE 20 MEQ PO PACK
40.0000 meq | PACK | Freq: Once | ORAL | Status: DC
  Filled 2022-12-22: qty 2

## 2022-12-22 MED ORDER — LOSARTAN POTASSIUM 25 MG PO TABS: 100.0000 mg | ORAL_TABLET | Freq: Every day | ORAL | Status: DC

## 2022-12-22 MED ORDER — PAROXETINE HCL 20 MG PO TABS: 20.0000 mg | ORAL_TABLET | Freq: Every day | ORAL | Status: DC

## 2022-12-22 MED ORDER — POTASSIUM CHLORIDE CRYS ER 20 MEQ PO TBCR
40.0000 meq | EXTENDED_RELEASE_TABLET | Freq: Once | ORAL | Status: DC
  Filled 2022-12-22: qty 4

## 2022-12-22 MED ORDER — ROSUVASTATIN CALCIUM 20 MG PO TABS: 40.0000 mg | ORAL_TABLET | Freq: Every day | ORAL | Status: DC

## 2022-12-22 MED ORDER — ASPIRIN 81 MG PO TBEC: 81.0000 mg | DELAYED_RELEASE_TABLET | Freq: Every day | ORAL | Status: DC

## 2022-12-22 MED ORDER — QUETIAPINE FUMARATE 100 MG PO TABS: 200.0000 mg | ORAL_TABLET | Freq: Every day | ORAL | Status: DC

## 2022-12-22 NOTE — ED Notes (Signed)
Pt was upset due to the fact she was getting discharged. Pt states that "If something happens to me, itll be on yall" Pt walked out of ED being verbally abusive to staff.

## 2022-12-22 NOTE — ED Notes (Signed)
Attempted lab draw, pt. Stated, "take that needle out of my arm, yall are not going to keep twisting that needle in my arm."

## 2022-12-22 NOTE — ED Provider Notes (Signed)
Behavioral Health Urgent Care Medical Screening Exam  Patient Name: Kaitlyn Gray MRN: [id number] Date of Evaluation: 12/22/22 Chief Complaint:   auditory hallucinations Diagnosis:  Final diagnoses:  Malingering  Inability to access community resources due to transportation insecurity  Polysubstance abuse (HCC)    History of Present illness: Kaitlyn Gray is a 47 y.o. female patient presented to [facility] as a walk in with complaints of auditory hallucination  Patient is well know to ED and UC care facility and has had multiple presentations with similar complaints.    Kaitlyn Gray, 47 y.o., female patient seen face to face by this provider, chart reviewed, and consulted with Dr. Nelly Rout on 12/22/22.  On evaluation Kaitlyn Gray reports she just left [facility] and was brought here by the police.  Patient states that she is hearing voices telling her to kill herself and wants to know if she can get back on her "shot. Abilify 400 mg."  States it was year 2000 since she last received injection and that she has been currently taking Paxil, Vistaril, "and something else" but unsure of name of medications, who prescribes, or the last time she took it.  States that she doesn't have outpatient psychiatric services and doesn't know who her PCP is.  She states that she lives alone in an apartment and has no transportation.  Reports she is unable to walk long distance related to walker.  She states that she has no food, or money because she is waiting on her check that she hasn't received.  She states that she does have Medicaid and forgot that she could use Medicaid transport for doctor appointments.  Patient also reports that she does cocaine and marijuana.  Last time done either was June 29 or 30th.  Patient wants to know if she can be admitted and informed that she doesn't meet the criteria for admission.  She then becomes upset and starts yelling "It was a wast of time for  me to come here.  If you ain't gonna let me stay then let me go."  Informed patient that I'm trying to help her but she has to talk and she can't be yelling.  Informed her that she has an appointment tomorrow in outpatient and that I could provide her a bus pass to get back for appointment.  Also could give her the [place] [employer] that has a listing of all of the places she can go for free meals, and after her appointment she can speak to the social worker who may be able to help her about her check or other community services available.  During evaluation Kaitlyn Gray is seated in the exam room with no noted distress.  She is alert/oriented x 4, initially calm and  cooperative but gets upset when she is told she is not going to be admitted.  Her responses were liner but relevant  to assessment questions.  She spoke in a clear tone at moderate volume, and normal pace, with good eye contact.   She denies active suicidal ideation stating she doesn't want to die but is hearing voices telling her to hurt herself.  She denies homicidal ideation and paranoia.  Chart review show chronic history of auditory hallucinations telling her to kill or hurt herself.  Objectively there is no evidence of psychosis/mania or delusional thinking other than patients endorsement.  she conversed coherently, with goal directed thoughts, distractibility, or pre-occupation. At this time Kaitlyn Gray  is educated and verbalizes understanding of mental health resources and other crisis services in the community. She is instructed to call 911 and present to the nearest emergency room should she experience any suicidal/homicidal ideation, auditory/visual/hallucinations, or detrimental worsening of her mental health condition.  She was a also advised by Clinical research associate that she could call the toll-free phone on back of Medicaid card to speak with care coordinator.    Discharged from [facility] ED today and then brought to the [facility]  She has been  in the ED or UC multiple times in the last several months most recent visits were 6/21, 23, 24, 28, 29, 7/3, 6, 7, 01/2023 and at time 2 visits on same day going from one hospital to the next.     This patient has a significant history of malingering evident by multiple emergency room (ED), urgent care (UC) visits with similar complaints of auditory hallucination, passive suicidal ideation or active suicidal ideation.  Patient has been observed overnight in the ED and UC and there has been no unsafe behavior observed.  Patients' similar complaint of suicidal ideation is often resolved once housing or other need has been arranged for her.   During the times she was monitored in ED or urgent care she has demonstrates no unsafe behavior and there is no psychiatric condition which is amendable to inpatient psychiatric hospitalization.  Patients' suicidal ideation is conditional and used as a means of manipulation and/or attention seeking without true intention to harm herself.  Her behaviors are more consistent with someone who is acting in self-preservation, rather that someone who is acutely suicidal.  Given all that is stated above including the fact that the patient shows no signs of mania or psychosis, has a liner, coherent thought process throughout assessment the patient does not meet criteria for inpatient psychiatric hospitalization or further psychiatric evaluation at this time.  Patient would benefit from outpatient psychiatric services which resources will be provided along with community resources prior to discharge.    Patient ongoing endorsement of suicidal ideation shows clear evidence of secondary gain of unmet needs that is representative of limited and often- maladaptive coping skills and rather than an indicator of imminent risk of death.  Evidence indicates that subsequent suicide attempts by patients who made contingent suicide threats (defined as threatened suicide or exaggerated suicidality)  are uncommon in both groups.  Hospitalization should not be used as a substitute for social services, substance abuse treatment, and legal assistance for patients who make contingent suicide threats (Characteristics and six-month outcome of patients who use suicide threats to seek hospital admission.  (1966). Psychiatric Services, 47(8), [phone]. (DOI: 10.1176/ps.47.8.871).   Patient has not benefited from past hospitalization under similar circumstances in terms of suicide risk modification, improvement in mental health, or social conditions.  Patients repeated use of ED, UC, and psychiatric hospitalization services instead of recommended outpatient follow-up is ineffective in helping patients' improvement.  Psychiatric hospitalization is not indicated, and the patient's refusal of interventions that have been offered by clinical care teams during prior stays in the ED, UC and during psychiatric hospitalization to mitigate risk of self-harm.  This provider and Dr. Ovidio Kin have discussed assessment and treatment recommendations with patient.  Further we see no evidence of severe psychosis, cognitive impairment, intoxication, or other condition that prevents the patient from acting under their own choice and volition.      Flowsheet Row ED from 12/22/2022 in [facility] ED from 12/21/2022 in [facility]  Emergency Department at [facility] ED from 12/20/2022 in [facility] Emergency Department at [facility]  C-SSRS RISK CATEGORY Moderate Risk Moderate Risk No Risk       Psychiatric Specialty Exam  Presentation  General Appearance:Disheveled  Eye Contact:Fair  Speech:Clear and Coherent  Speech Volume:Normal  Handedness:Right   Mood and Affect  Mood: Dysphoric  Affect: Congruent   Thought Process  Thought Processes: Linear  Descriptions of Associations:Intact  Orientation:Full (Time, Place and Person)  Thought Content:Logical  Diagnosis of  Schizophrenia or Schizoaffective disorder in past: No   Hallucinations:Auditory States she is hearing voices telling her to hurt herself. reports seeing people on the walls  Ideas of Reference:None  Suicidal Thoughts:Yes, Passive Without Intent; Without Plan; Without Means to [employer] (States that the voices are telling her to kill herself, (chronic) states she doesn't want to die.) Without Intent; Without Plan  Homicidal Thoughts:No   Sensorium  Memory: Immediate Fair; Recent Fair; Remote Fair  Judgment: Intact  Insight: Fair   Art therapist  Concentration: Good  Attention Span: Good  Recall: [employer] of Knowledge: Fair  Language: Fair   Psychomotor Activity  Psychomotor Activity: Normal   Assets  Assets: Manufacturing systems engineer; Desire for Improvement; Financial Resources/Insurance; Housing   Sleep  Sleep: Good  Number of hours: No data recorded  Physical Exam: Physical Exam Vitals and nursing note reviewed. Exam conducted with a chaperone present.  Constitutional:      General: She is not in acute distress.    Appearance: Normal appearance. She is not ill-appearing.  HENT:     Head: Normocephalic.  Eyes:     Conjunctiva/sclera: Conjunctivae normal.  Cardiovascular:     Rate and Rhythm: Normal rate.  Pulmonary:     Effort: Pulmonary effort is normal. No respiratory distress.  Musculoskeletal:        General: Normal range of motion.     Cervical back: Normal range of motion.  Skin:    General: Skin is warm and dry.  Neurological:     Mental Status: She is alert and oriented to person, place, and time.  Psychiatric:        Attention and Perception: Attention and perception normal.        Mood and Affect: Mood is depressed.        Speech: Speech normal.        Behavior: Behavior is agitated. Behavior is cooperative.        Thought Content: Thought content is not paranoid or delusional. Thought content does not include homicidal  ideation. Suicidal: Passive with no intent or plan.       Judgment: Judgment normal.    Review of Systems  Constitutional:        No other complaints voiced  Musculoskeletal:        Uses a walker for ambulation  Psychiatric/Behavioral:  Positive for depression and substance abuse (Cocaine and marijuana). Hallucinations: Reporting she is hearing voices (Chronic).The patient is not nervous/anxious and does not have insomnia.        States she doesn't have any money or food, poor housing, and no transportation  All other systems reviewed and are negative.  Blood pressure (!) 165/94, pulse 63, temperature 97.8 F (36.6 C), temperature source Oral, resp. rate 18, SpO2 100 %. There is no height or weight on file to calculate BMI.  Musculoskeletal: Strength & Muscle Tone:  Left side weakness Gait & Station:  Uses a rolling walker to assist with  ambulation Patient leans: N/A   [facility] MSE Discharge Disposition for Follow up and Recommendations: Based on my evaluation the patient does not appear to have an emergency medical condition and can be discharged with resources and follow up care in outpatient services for Medication Management and Individual Therapy  Will also refer to social work for referral to CST or ACTT services   Jhalen Eley, NP 12/22/2022, 2:57 PM

## 2022-12-22 NOTE — ED Notes (Signed)
This NT went to her bed to get her EKG. Pt told this tech "I am not doing that fuck off." Rn notified of pts refusal to the EKG.

## 2022-12-22 NOTE — Progress Notes (Signed)
   12/22/22 1354  [facility] Triage Screening (Walk-ins at [facility] only)  What Is the Reason for Your Visit/Call Today? Pt is a 47 yo female who presented shortly after being discharged from [facility] today. Pt came to [facility] early this morning with a similar presentation. Pt was scanttly dressed and was disheveled and was ambulating using a walker. Pt stated that she was hearing voices telling her to hurt herself. Pt reported passive SI if she had to leave/if she was discharged. Pt was reminded of her OP appointment tomorrow at [facility]. Pt stated that it was past time "for my shot." NP reminded pt that OP Services could help her with getting her shot and Medicaid services like transportation. NP advised pt she could give her information about where she can eat for free daily all 3 meals. NP offered pt buss passes home today and back tomorrow so she could make her appointment. Pt became upset yelling loudly becuase she was being discharged stating "Y'all not gonna keep me. Well, I hoipe I die." Pt began to cry loudly for a short period of time (about a minute). Pt began spitting as she talked and was behaving so loudly and erratically that security noticed and came to the room to assist.  How Long Has This Been Causing You Problems? > than 6 months (per chart)  Have You Recently Had Any Thoughts About Hurting Yourself? Yes ("wish to be dead" but no plan reported)  How long ago did you have thoughts about hurting yourself? today  Are You Planning to Commit Suicide/Harm Yourself At This time? No (no plan reported)  Have you Recently Had Thoughts About Hurting Someone [name]?  (unable to answer due to agitation)  Are You Planning To Harm Someone At This Time?  (unable to answer due to agitation)  Explanation: no HI reported and no violent actions to this time today  Are you currently experiencing any auditory, visual or other hallucinations? Yes  Please explain the hallucinations you are currently experiencing:  reported hearing voices telling her to kill herself earlier today  Have You Used Any Alcohol or Drugs in the Past 24 Hours? No (denied recent use)  What Did You Use and How Much? na  Do you have any current medical co-morbidities that require immediate attention? No (none reported)  Clinician description of patient physical appearance/behavior: scantily dressed and disheveled. mumbling speech with coherent thought expressed. when told she was not staying, she became loud and disagreeable. she did not appear to be responding to internal stimuli. alert and oriented.  What Do You Feel Would Help You the Most Today? Treatment for Depression or other mood problem  If access to [facility] was not available, would you have sought care in the Emergency Department? No  Determination of Need Routine (7 days) (Per Shuvon Rankin NP pt is psychicatrically cleared.)  Options For Referral Medication Management;Outpatient Therapy

## 2022-12-22 NOTE — ED Notes (Addendum)
Patient Is discharging at this time. Printed AVS reviewed with patient by provider along with follow up appointments and resources. Patient appeared angry, irritable, and loud due to not getting admitted. Patient provided with bus passes round trip so that she is able to make it to her appointment tomorrow. All Valuables/belongings returned to patient. BHART team being contacted by social worker to follow up with patient. No s/s of current distress.

## 2022-12-22 NOTE — Progress Notes (Signed)
   12/22/22 1436  [place] Suicide Severity Rating Scale  1. Wish to be Dead Yes  2. Suicidal Thoughts Yes  3. Suicidal Thoughts with Method Without Specific Plan or Intent to Act Yes  4. Suicidal Intent Without Specific Plan No  5. Suicide Intent with Specific Plan No  6. Suicide Behavior Question No  C-SSRS RISK CATEGORY Moderate Risk  Patient location: [facility] Urgent Care/Facility Based Crisis Center  [facility] Urgent Care/Facility [facility] Crisis Center Suicide Precautions Interventions  [facility] Suicide Precautions Interventions Moderate Risk Interventions

## 2022-12-22 NOTE — ED Notes (Addendum)
Patient belongs is located in cabinet 5-8 that has patient belongings on it.  Patient walker is still with her in room 6 along with patient label.

## 2022-12-22 NOTE — ED Notes (Signed)
Pt's belongs inventoried & locked in locker 6.

## 2022-12-22 NOTE — Discharge Instructions (Signed)
Take the potassium supplementation as prescribed and follow-up with your doctor.  Return to the ED with new or worsening symptoms.

## 2022-12-22 NOTE — BH Assessment (Signed)
Comprehensive Clinical Assessment (CCA) Note   12/22/2022 ASHLEE NISHIO [id number]  Disposition: Per Roselyn Bering, NP , pt is psych cleared and can be discharged.   The patient demonstrates the following risk factors for suicide: Chronic risk factors for suicide include: psychiatric disorder of Bipolar D/O . Acute risk factors for suicide include: unemployment. Protective factors for this patient include: hope for the future. Considering these factors, the overall suicide risk at this point appears to be moderate. Patient is appropriate for outpatient follow up.   Pt is a 47 y.o. female who presents to [facility] voluntarily with the complaint of passive SI. Pt has history of bipolar disorder and polysubstance abuse. Pt reports that she does not have any money or food to eat. Pt has hx of several ED visits with the complaint of no money and food which results in passive SI thoughts. Pt states hearing voices telling her to hurt herself. Pt denies HI.  Pt denies any alcohol or drug abuse. Pt reports that she is not compliant with her medications and does not have a therapist.  Pt was casually dressed and groomed appropriately. Pt is alert, oriented x4 with normal speech and normal motor behavior. Eye contact is good. Pt's mood is depressed, and affect is flat. Thought process is coherent and relevant. Pt's insight is fair and judgement is poor. There is no indication pt is currently responding to internal stimuli or experiencing delusional thought content. Pt was cooperative throughout assessment.     Chief Complaint:  Chief Complaint  Patient presents with   Suicidal   Visit Diagnosis:  Bipolar D/O    CCA Screening, Triage and Referral (STR)  Patient Reported Information How did you hear about [place]? Other (Comment) ([facility])  What Is the Reason for Your Visit/Call Today? Pt is a 47 y.o. female who presents to [facility] voluntarily with the complaint of passive SI. Pt has history of bipolar disorder  and polysubstance abuse. Pt reports that she does not have any money or food to eat. Pt has hx of several ED visits with the complaint of no money and food which results in passive SI thoughts. Pt states hearing voices telling her to hurt herself. Pt denies HI.  Pt denies any alcohol or drug abuse. Pt reports that she is not compliant with her medications and does not have a therapist.  How Long Has This Been Causing You Problems? 1-6 months  What Do You Feel Would Help You the Most Today? Treatment for Depression or other mood problem   Have You Recently Had Any Thoughts About Hurting Yourself? Yes  Are You Planning to Commit Suicide/Harm Yourself At This time? Yes (Plan to overdose on Seroquel)   Flowsheet Row ED from 12/21/2022 in [facility] Emergency Department at [facility] ED from 12/20/2022 in [facility] Emergency Department at [facility] ED from 12/17/2022 in [facility] Emergency Department at [facility]  C-SSRS RISK CATEGORY Moderate Risk No Risk No Risk       Have you Recently Had Thoughts About Hurting Someone [name]? No  Are You Planning to Harm Someone at This Time? No  Explanation: Pt denies HI   Have You Used Any Alcohol or Drugs in the Past 24 Hours? No  What Did You Use and How Much? Pt denied SI,HI   Do You Currently Have a Therapist/Psychiatrist? No  Name of Therapist/Psychiatrist: Name of Therapist/Psychiatrist: n/a   Have You Been Recently Discharged From Any Office Practice or Programs?  No  Explanation of Discharge From Practice/Program: N/A     CCA Screening Triage Referral Assessment Type of Contact: Tele-Assessment  Telemedicine Service Delivery: Telemedicine service delivery: This service was provided via telemedicine using a 2-way, interactive audio and video technology  Is this Initial or Reassessment? Is this Initial or Reassessment?: Initial Assessment  Date Telepsych consult ordered in [facility]:  Date Telepsych consult  ordered in [facility]: 12/21/22  Time Telepsych consult ordered in [facility]:  Time Telepsych consult ordered in [facility]: [id number]  Location of Assessment: [facility] ED  Provider Location: [facility] Assessment Services   Collateral Involvement: none   Does Patient Have a Automotive engineer Guardian? No  Legal Guardian Contact Information: n/a  Copy of Legal Guardianship Form: -- (n/a)  Legal Guardian Notified of Arrival: -- (n/a)  Legal Guardian Notified of Pending Discharge: -- (N/A)  If Minor and Not Living with Parent(s), Who has Custody? n/a  Is CPS involved or ever been involved? Never  Is APS involved or ever been involved? Never   Patient Determined To Be At Risk for Harm To Self or Others Based on Review of Patient Reported Information or Presenting Complaint? Yes, for Self-Harm  Method: No Plan  Availability of Means: No access or NA  Intent: Vague intent or NA  Notification Required: No need or identified person  Additional Information for Danger to Others Potential: -- (n/a)  Additional Comments for Danger to Others Potential: n/a  Are There Guns or Other Weapons in Your Home? No  Types of Guns/Weapons: Pt denies access to guns/ weapons  Are These Weapons Safely Secured?                            Yes  Who Could Verify You Are Able To Have These Secured: n/a  Do You Have any Outstanding Charges, Pending Court Dates, Parole/Probation? Pt denies ant pending legal charges  Contacted To Inform of Risk of Harm To Self or Others: Other: Comment    Does Patient Present under Involuntary Commitment? No    [place] of Residence: [place]   Patient Currently Receiving the Following Services: Not Receiving Services   Determination of Need: Routine (7 days)   Options For Referral: Outpatient Therapy; Medication Management     CCA Biopsychosocial Patient Reported Schizophrenia/Schizoaffective Diagnosis in Past: No   Strengths: Patient presents seeking  treatment   Mental Health Symptoms Depression:   Difficulty Concentrating; Hopelessness; Irritability; Tearfulness   Duration of Depressive symptoms:    Mania:   None   Anxiety:    Tension; Worrying   Psychosis:   Hallucinations   Duration of Psychotic symptoms:    Trauma:   None   Obsessions:   None   Compulsions:   None   Inattention:   None   Hyperactivity/Impulsivity:   None   Oppositional/Defiant Behaviors:   None   Emotional Irregularity:   Mood lability; Potentially harmful impulsivity; Recurrent suicidal behaviors/gestures/threats   Other Mood/Personality Symptoms:   None    Mental Status Exam Appearance and self-care  Stature:   Average   Weight:   Average weight   Clothing:   Casual   Grooming:   Normal   Cosmetic use:   None   Posture/gait:   Normal   Motor activity:   Slowed (related to stroke hx)   Sensorium  Attention:   Normal   Concentration:   Normal   Orientation:   X5   Recall/memory:  Normal (uta)   Affect and Mood  Affect:   Blunted   Mood:   Pessimistic   Relating  Eye contact:   Fleeting   Facial expression:   Depressed   Attitude toward examiner:   Cooperative; Dramatic   Thought and Language  Speech flow:  Garbled; Paucity; Slurred (slurred speech - hx of mult. strokes)   Thought content:   Appropriate to Mood and Circumstances   Preoccupation:   None   Hallucinations:   Visual; Auditory   Organization:   Coherent   Company secretary of Knowledge:   Average   Intelligence:   Average   Abstraction:   Functional   Judgement:   Impaired   Reality Testing:   Adequate   Insight:   Flashes of insight; Poor   Decision Making:   Impulsive   Social Functioning  Social Maturity:   Impulsive   Social Judgement:   Heedless   Stress  Stressors:   Housing; Office manager Ability:   Overwhelmed   Skill Deficits:   Scientist, physiological; Self-control    Supports:   Friends/Service system     Religion: Religion/Spirituality Are You A Religious Person?: No How Might This Affect Treatment?: N/A  Leisure/Recreation: Leisure / Recreation Do You Have Hobbies?: No  Exercise/Diet: Exercise/Diet Do You Exercise?: No Have You Gained or Lost A Significant Amount of Weight in the Past Six Months?: No (patient reports weight loss occurred in the past 5 months) Number of Pounds Gained: 0 Number of Pounds Lost?: 60 Do You Follow a Special Diet?: No Do You Have Any Trouble Sleeping?: Yes Explanation of Sleeping Difficulties: patient reports sleep is "up and down"   CCA Employment/Education Employment/Work Situation: Employment / Work Situation Employment Situation: Unemployed (Unemployed. Patient states that she is unable to work due to her medical issues.) Why is Patient on Disability: N/A,  patient does not receive disability How Long has Patient Been on Disability: Patient states that she does not receive disability. States that she does not have any money. Patient's Job has Been Impacted by Current Illness: No Has Patient ever Been in the [employer]?: No  Education: Education Is Patient Currently Attending School?: No Last Grade Completed: 11 Did You Attend College?: No Did You Have An Individualized Education Program (IIEP): No Did You Have Any Difficulty At School?: No Patient's Education Has Been Impacted by Current Illness: No   CCA Family/Childhood History Family and Relationship History: Family history Marital status: Single Does patient have children?: Yes How many children?: 3 How is patient's relationship with their children?: Per chart review patient recently reported she has children, ages 75 and 66. Today, patient states she has a 35 and 36 year old children who live in group homes.  Childhood History:  Childhood History By whom was/is the patient raised?: Grandparents Did patient suffer any  verbal/emotional/physical/sexual abuse as a child?: No Did patient suffer from severe childhood neglect?: No Has patient ever been sexually abused/assaulted/raped as an adolescent or adult?: No Was the patient ever a victim of a crime or a disaster?: No Witnessed domestic violence?: No Has patient been affected by domestic violence as an adult?: No       CCA Substance Use Alcohol/Drug Use: Alcohol / Drug Use Pain Medications: SEE MAR Prescriptions: SEE MAR Over the Counter: SEE MAR History of alcohol / drug use?: Yes Longest period of sobriety (when/how long): UTA Negative Consequences of Use:  (n/a) Withdrawal Symptoms: None Substance #1 Name of Substance  1: Cocaine 1 - Age of First Use: UTA 1 - Amount (size/oz): unknown 1 - Frequency: UTA 1 - Duration: UTA 1 - Last Use / Amount: UTA 1 - Method of Aquiring: n/a 1- Route of Use: n/a Substance #2 Name of Substance 2: Marijuana 2 - Age of First Use: teen; 47 yrs old 2 - Amount (size/oz): 1 blunt 2 - Frequency: "occasionally" 2 - Duration: ongoing 2 - Last Use / Amount: unknown                     ASAM's:  Six Dimensions of Multidimensional Assessment  Dimension 1:  Acute Intoxication and/or Withdrawal Potential:   Dimension 1:  Description of individual's past and current experiences of substance use and withdrawal: Patient does not appear impaired  Dimension 2:  Biomedical Conditions and Complications:   Dimension 2:  Description of patient's biomedical conditions and  complications: Hx of mult strokes, difficulty ambulating  Dimension 3:  Emotional, Behavioral, or Cognitive Conditions and Complications:  Dimension 3:  Description of emotional, behavioral, or cognitive conditions and complications: Underlying Bipolar Disorder - untreateed, states she forgets to take her meds  Dimension 4:  Readiness to Change:  Dimension 4:  Description of Readiness to Change criteria: Does not mention hx of SA today - chart  review indicates long hx of cocaine use  Dimension 5:  Relapse, Continued use, or Continued Problem Potential:  Dimension 5:  Relapse, continued use, or continued problem potential critiera description: hx of multiple relapses  Dimension 6:  Recovery/Living Environment:  Dimension 6:  Recovery/Iiving environment criteria description: No support  ASAM Severity Score: ASAM's Severity Rating Score: 12  ASAM Recommended Level of Treatment: ASAM Recommended Level of Treatment: Level III Residential Treatment   Substance use Disorder (SUD) Substance Use Disorder (SUD)  Checklist Symptoms of Substance Use: Continued use despite having a persistent/recurrent physical/psychological problem caused/exacerbated by use, Continued use despite persistent or recurrent social, interpersonal problems, caused or exacerbated by use, Persistent desire or unsuccessful efforts to cut down or control use, Social, occupational, recreational activities given up or reduced due to use  Recommendations for Services/Supports/Treatments: Recommendations for Services/Supports/Treatments Recommendations For Services/Supports/Treatments: Individual Therapy, CST Media planner), Medication Management, SAIOP (Substance Abuse Intensive Outpatient Program), CD-IOP Intensive Chemical Dependency Program, Inpatient Hospitalization, Facility Based Crisis  Discharge Disposition:    DSM5 Diagnoses: Patient Active Problem List   Diagnosis Date Noted   Stroke-like symptoms 08/05/2022   Toxic encephalopathy 08/04/2022   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) 05/01/2022   Iron deficiency anemia 04/07/2022   Fall at home, initial encounter 04/07/2022   Ambulatory dysfunction 04/07/2022   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 04/07/2022   Class 1 obesity 04/06/2022   Tobacco abuse 04/06/2022   Abdominal pain 04/04/2022   History of stroke 03/31/2022   Hemiparesis affecting left  side as late effect of cerebrovascular accident (CVA) (HCC) 03/31/2022   Opiate use    Microcytic anemia 03/30/2022   Noncompliance with medications    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   History of cervical fracture 12/24/2017   Type 2 diabetes mellitus (HCC) 12/06/2017   Hypertension associated with diabetes (HCC) 12/06/2017   Bipolar disorder (HCC) 07/19/2017   Polysubstance abuse (HCC) 07/19/2017   Depression 03/24/2015   MDD (major depressive disorder), recurrent, severe, 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

## 2022-12-22 NOTE — ED Notes (Signed)
TTS at bedside. 

## 2022-12-22 NOTE — ED Provider Notes (Signed)
[place] EMERGENCY DEPARTMENT AT [facility] Provider Note  CSN: [id number] Arrival date & time: 12/22/22 1600  Chief Complaint(s) Suicidal and Hallucinations  HPI Kaitlyn Gray is a 47 y.o. female history of malingering, prior stroke, polysubstance abuse, bipolar disorder presenting to the emergency department with suicidal ideation.  Patient reports suicidal ideation with a plan to overdose on pills.  She was seen earlier today at behavior health urgent care and was discharged because they felt she was malingering.  She reports that she really wants to kill herself.  She currently denies any medical complaints since as chest pain, shortness of breath, abdominal pain, nausea or vomiting, headaches or any other new symptoms.  There was some report of possible near syncope at the bus stop however the patient refuses to have EKG performed and denies any history of this and reports she is just here because she is suicidal.   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    Encounter for assessment of healthcare decision-making capacity    History of cervical fracture 12/24/2017   nondisplaced fracture lateral mass of C1 on the right on CT 12/24/17   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) 07/19/2017   Hypertension    Hypertensive emergency 05/31/2022   Ischemic stroke (HCC) 01/01/2020   subacute right middle cerebellar peduncle and pons infarction   Left-sided weakness 01/27/2022   Head CT with remote right occipital infarct which is consistent with old left-sided weakness   MDD (major depressive disorder), recurrent severe, 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    Normocytic anemia 02/09/2020   Opiate use    History of Suboxone Therapy until 05/2021   Polysubstance abuse (HCC) 07/19/2017   Prescription drug misadventures (Seroquel)    Patient Active Problem List   Diagnosis Date Noted   Malingering 12/22/2022   Inability to access community resources due to transportation insecurity 12/22/2022   Stroke-like symptoms 08/05/2022   Toxic encephalopathy 08/04/2022   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) 05/01/2022   Iron deficiency anemia 04/07/2022   Fall at home, initial encounter 04/07/2022   Ambulatory dysfunction 04/07/2022   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 04/07/2022   Class 1 obesity 04/06/2022   Tobacco abuse 04/06/2022   Abdominal pain 04/04/2022   History of stroke 03/31/2022   Hemiparesis affecting left side as late effect of cerebrovascular accident (CVA) (HCC) 03/31/2022   Opiate use    Microcytic anemia 03/30/2022   Noncompliance with medications    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   History of cervical fracture 12/24/2017   Type 2 diabetes mellitus (HCC) 12/06/2017   Hypertension associated with diabetes (HCC) 12/06/2017   Bipolar disorder (HCC) 07/19/2017   Polysubstance abuse (HCC) 07/19/2017   Depression 03/24/2015   MDD (major depressive disorder), recurrent, severe, with psychosis (HCC) 03/23/2015   Cannabis abuse 03/23/2013   Home Medication(s) Prior to Admission medications   Medication Sig Start Date End Date Taking? Authorizing Provider  amLODipine (NORVASC) 10 MG tablet Take 1 tablet (10 mg total) by mouth daily. 06/04/22 12/14/22  [place], Eric J, DO  aspirin EC 81 MG tablet Take 81 mg by mouth daily.  Swallow whole.    [provider]  ferrous sulfate 325 (65 FE) MG tablet Take 1 tablet (325 mg total) by mouth every other day. Patient not taking: Reported on 12/14/2022 06/04/22 11/20/22  [place], Alvira Philips, DO  gabapentin (NEURONTIN)  100 MG capsule Take 1 capsule (100 mg total) by mouth 3 (three) times daily for pain scale 4-7. Patient not taking: Reported on 12/14/2022 12/01/22     gabapentin (NEURONTIN) 300 MG capsule Take 1 capsule (300 mg total) by mouth at bedtime. 06/04/22 12/14/22  [place], Alvira Philips, DO  losartan (COZAAR) 100 MG tablet Take 1 tablet (100 mg total) by mouth daily. 06/04/22 12/14/22  [place], Alvira Philips, DO  metFORMIN (GLUCOPHAGE) 500 MG tablet Take 1 tablet (500 mg total) by mouth 2 (two) times daily with a meal. 06/04/22 12/14/22  [place], Alvira Philips, DO  nicotine polacrilex (NICORETTE) 2 MG gum Take 1 each (2 mg total) by mouth every 2 (two) hours as needed for Smoking Cravings Patient not taking: Reported on 12/14/2022 12/01/22     pantoprazole (PROTONIX) 40 MG tablet Take 1 tablet (40 mg total) by mouth daily. Patient taking differently: Take 40 mg by mouth daily as needed (Indigestion). 06/04/22 12/14/22  [place], Alvira Philips, DO  PARoxetine (PAXIL) 20 MG tablet Take 1 tablet (20 mg total) by mouth daily for depression. 12/14/22   Lenox Ponds, NP  potassium chloride SA (KLOR-CON M) 20 MEQ tablet Take 1 tablet (20 mEq total) by mouth 2 (two) times daily. 12/22/22   Rancour, Jeannett Senior, MD  QUEtiapine (SEROQUEL) 200 MG tablet Take 1 tablet (200 mg total) by mouth at bedtime. 12/14/22 03/14/23  Lenox Ponds, NP  rosuvastatin (CRESTOR) 40 MG tablet Take 1 tablet (40 mg total) by mouth daily. 06/04/22 12/14/22  [place], Eric J, DO                                                                                                                                    Past Surgical History Past Surgical History:  Procedure Laterality Date   CESAREAN SECTION     Family History Family History  Problem Relation Age of Onset   Hypertension Mother    CAD Mother 74       died of MI at age 6   Hypertension Father     Social History Social History   Tobacco Use   Smoking status: Some Days    Packs/day: .5    Types: Cigarettes     Passive exposure: Current   Smokeless tobacco: Former  Building services engineer Use: Never used  Substance Use Topics   Alcohol use: No   Drug use: Yes    Types: Marijuana, Cocaine   Allergies Tomato, Beef-derived products, Hydrocodone, and Latex  Review of Systems Review of Systems  All other systems reviewed and are negative.   Physical Exam Vital Signs  I have reviewed the triage  vital signs BP (!) 151/70 (BP Location: Left Arm)   Pulse 66   Temp 98 F (36.7 C) (Oral)   Resp 20   LMP  (LMP Unknown)   SpO2 100%  Physical Exam Vitals and nursing note reviewed.  Constitutional:      General: She is not in acute distress.    Appearance: She is well-developed.  HENT:     Head: Normocephalic and atraumatic.     Mouth/Throat:     Mouth: Mucous membranes are moist.  Eyes:     Pupils: Pupils are equal, round, and reactive to light.  Cardiovascular:     Rate and Rhythm: Normal rate and regular rhythm.     Heart sounds: No murmur heard. Pulmonary:     Effort: Pulmonary effort is normal. No respiratory distress.     Breath sounds: Normal breath sounds.  Abdominal:     General: Abdomen is flat.     Palpations: Abdomen is soft.     Tenderness: There is no abdominal tenderness.  Musculoskeletal:        General: No tenderness.     Right lower leg: No edema.     Left lower leg: No edema.  Skin:    General: Skin is warm and dry.  Neurological:     General: No focal deficit present.     Mental Status: She is alert. Mental status is at baseline.  Psychiatric:        Mood and Affect: Mood normal.        Behavior: Behavior is agitated.        Thought Content: Thought content includes suicidal ideation. Thought content includes suicidal plan.     ED Results and Treatments Labs (all labs ordered are listed, but only abnormal results are displayed) Labs Reviewed  COMPREHENSIVE METABOLIC PANEL - Abnormal; Notable for the following components:      Result Value    Potassium 3.4 (*)    Glucose, Bld 102 (*)    Albumin 3.2 (*)    Total Bilirubin 0.2 (*)    All other components within normal limits  SALICYLATE LEVEL - Abnormal; Notable for the following components:   Salicylate Lvl <7.0 (*)    All other components within normal limits  ACETAMINOPHEN LEVEL - Abnormal; Notable for the following components:   Acetaminophen (Tylenol), Serum <10 (*)    All other components within normal limits  CBC - Abnormal; Notable for the following components:   WBC 3.2 (*)    Hemoglobin 8.8 (*)    HCT 30.3 (*)    MCV 75.6 (*)    MCH 21.9 (*)    MCHC 29.0 (*)    RDW 20.2 (*)    All other components within normal limits  CBG MONITORING, ED - Abnormal; Notable for the following components:   Glucose-Capillary 131 (*)    All other components within normal limits  ETHANOL  HCG, SERUM, QUALITATIVE  RAPID URINE DRUG SCREEN, HOSP PERFORMED  Radiology No results found.  Pertinent labs & imaging results that were available during my care of the patient were reviewed by me and considered in my medical decision making (see MDM for details).  Medications Ordered in ED Medications - No data to display                                                                                                                                   Procedures Procedures  (including critical care time)  Medical Decision Making / ED Course   MDM:  47 year old female presenting to the emergency department with suicidality.  Patient well-appearing, she does seem slightly agitated and adamant that she is suicidal with a plan to overdose on pills.  She was seen earlier in behavioral urgent care but reports that they did not take her seriously.  It is very possible patient continues to malinger but she does have underlying psychiatric illness so we will have her  reassessed by psychiatry.  Denies homicidal ideation.  Not reacting to internal stimuli or grossly psychotic.  Patient also had reported near syncopal event at the bus stop.  Patient refuses chest x-ray or EKG and denies this to me.  Her labs are notable for mild hypokalemia and chronic anemia.  Hypokalemia was repleted.  Low concern that this should impede her psychiatric evaluation.  Patient medically cleared.      Additional history obtained: -Additional history obtained from ems -External records from outside source obtained and reviewed including: Chart review including previous notes, labs, imaging, consultation notes including [facility] note   Lab Tests: -I ordered, reviewed, and interpreted labs.   The pertinent results include:   Labs Reviewed  COMPREHENSIVE METABOLIC PANEL - Abnormal; Notable for the following components:      Result Value   Potassium 3.4 (*)    Glucose, Bld 102 (*)    Albumin 3.2 (*)    Total Bilirubin 0.2 (*)    All other components within normal limits  SALICYLATE LEVEL - Abnormal; Notable for the following components:   Salicylate Lvl <7.0 (*)    All other components within normal limits  ACETAMINOPHEN LEVEL - Abnormal; Notable for the following components:   Acetaminophen (Tylenol), Serum <10 (*)    All other components within normal limits  CBC - Abnormal; Notable for the following components:   WBC 3.2 (*)    Hemoglobin 8.8 (*)    HCT 30.3 (*)    MCV 75.6 (*)    MCH 21.9 (*)    MCHC 29.0 (*)    RDW 20.2 (*)    All other components within normal limits  CBG MONITORING, ED - Abnormal; Notable for the following components:   Glucose-Capillary 131 (*)    All other components within normal limits  ETHANOL  HCG, SERUM, QUALITATIVE  RAPID URINE DRUG SCREEN, HOSP PERFORMED    Notable for mild hypokalemia, chronic anemia    Medicines ordered  and prescription drug management: No orders of the defined types were placed in this encounter.   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    Encounter for assessment of healthcare decision-making capacity    History of cervical fracture 12/24/2017   nondisplaced fracture lateral mass of C1 on the right on CT 12/24/17   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) 07/19/2017   Hypertension    Hypertensive emergency 05/31/2022   Ischemic stroke (HCC) 01/01/2020   subacute right middle cerebellar peduncle and pons infarction   Left-sided weakness 01/27/2022   Head CT with remote right occipital infarct which is consistent with old left-sided weakness   MDD (major depressive disorder), recurrent severe, 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  Despite best efforts to proofread,  errors can occur which can change the documentation meaning.    Lonell Grandchild, MD 12/22/22 [phone]

## 2022-12-22 NOTE — ED Notes (Signed)
Pt refused d/c vital signs 

## 2022-12-22 NOTE — Discharge Instructions (Addendum)
You have an appointment tomorrow 12/22/2021 at 1:00 PM at [facility] outpatient for therapy.  If you want to be seen prior to appointment you may go during walk in hours.  See below.  [facility]: Outpatient psychiatric Services:   Please see the walk in hours listed below.  Medication Management New Patient needing Medication Management Walk-in, and Existing Patients needing to see a provider for management coming as a walk in   Monday thru Friday 8:00 AM first come first serve until slots are full.  Recommend being there by 7:15 AM to ensure a slot is open.  Therapy New Patient Therapy Intake and Existing Patients needing to see therapist coming in as a walk in.   Monday, Wednesday, and Thursday morning at 8:00 am first come first serve.  Recommend being there by 7:15 AM to ensure a slot is open.    Every 1st, 2nd, and 3rd Friday at 1:00 PM first come first serve until slots are full.  Will still need to come in that morning at 7:15 AM to get registered for an afternoon slot.  For all walk-ins we ask that you arrive by 7:15 am because patients will be seen in there order of arrival (FIRST COME FIRST SERVE) Availability is limited, therefore you may not be seen on the same day that you walk in if all slots are full.    Our goal is to serve and meet the needs of our community to the best of our ability.             [facility] Address: [address] [place], [place], [place] [address] Phone: [phone]  Supported Employment The supported employment program is a person-centered, individualized, evidence-based support service that helps members choose, acquire, and maintain competitive employment in our community. This service supports the varying needs of individuals and promotes community inclusion and employment success. Members enrolled in the supported employment program can expect the following:  Development of an individual career  plan Community based job placement Job shadowing Job development On-site job Furniture conservator/restorer and support  Supported Education Supported education helps our members receive the education and training they need to achieve their learning and recovery goals. This will assist members with becoming gainfully employed in the job or career of their choice. The program includes assistance with: Registering for disability accommodations Enrolling in school and registering for classes Learning communication skills Scheduling tutoring sessions within your school [facility] partners with Vocational Rehabilitation to help increase the success of clients seeking employment and educational goals.  Want to learn more about our programs?   Please contact our intake department INTAKE: [phone] Ext [phone]  Mailing: [address] [address]   [place], [place] [address]   [email or web address]     Substance Abuse Treatment Programs  Intensive Outpatient Programs [facility]     [address]      [place], [place]                   [phone]       [facility] [address] [place], [place] [phone]  [facility]     (Inpatient and outpatient)     [address]           [phone]    [facility] [phone] (Suboxone and Methadone)  [address]      [place], [place] [address]  [phone]       [address] [place], [place] [phone]  [facility] (Outpatient/Inpatient, [employer])    (insurance only) [phone]             [facility] (Groups & Residential) [place], [place] [phone]     [facility]     [address]     [place], [place]      [phone]       [facility] (for caregivers and family) [phone] [address] [place], [place] [phone]      Residential Treatment Programs [facility]      [address] [place], [place] [address]  [phone]       [facility] [address], [place], [place] [address] [phone]  [facility] [place]        [phone]       [facility] [phone]  [facility] ([facility]             [address]                                         [place], [place]                                                [phone] or [phone]                               [facility] [address] [place], [address] [phone]  [facility]    [address]      [place], [place]     [phone]       [facility] [address] [place], [place] [phone]  [facility]   [address] [place], [place] [address]     [phone]      Admissions: 8am-3pm M-F  [facility] ([facility]) [address] [place], [place] [phone]  [facility]: Residential Program [phone] Days)   [place], [place]      [phone] or [phone]     [facility]: [facility] [place], [place] (Walk in Hours over the weekend or by referral)  [facility] [address] [place], [place], [place] [address] [phone]  Crisis Mobile: [facility]:  [phone] (for crisis response 24 hours a day) [facility] Hotline:      [phone] Outpatient Psychiatry and Counseling  [facility]: Mobile Crisis Management 24 hours:  [phone]  [facility] of [facility] [employer] sliding scale fee and walk in schedule: M-F 8am-12pm/1pm-3pm [address]  [place], [place] [address] [phone]  [facility] [address] [place], [place] [address] [phone]  [employer] (Formerly known as [employer])- new patient walk-in appointments available Monday - Friday 8am -3pm.          [address] [place], [place] [address] [phone] or crisis line-  [phone]  [facility] Out[facility] Services/ Intensive Outpatient Therapy Program [address] [place], [place] [address] [phone]  [facility]                  Crisis Services  [phone]      [address]     [place], [place] [address]                 [facility]   [facility] [phone]. [address] [place], [place] [address]   [facility]          [address]  [place], [place] [address]       [phone]  [facility] [address] [place], [place] [address] [phone]  [facility]    [address]    [place], [place] [address]     [phone]       Andee Poles, MD     [address]     [place] [address]     [phone]       [facility] [address] [place] [address]  [facility]     [address] [place], [place]      [phone]       [facility] Eulogio Ditch, MD [address] [place], [place] [address] [phone]  [facility]     [address]     [place], [place] [address]     [phone]       [facility] [address] [place], [place] [address] [phone] Resources for Temporary Residential Assistance/Crisis Centers  [facility]ENT[facility]) M-F 8am-3pm   [address] [place], [place] [address]   [phone] Services include: laundry, barbering, support groups, case management, phone  & computer access, showers, AA/NA mtgs, mental health/substance abuse nurse, job skills class, disability information, VA assistance, spiritual classes, etc.   HOMELESS SHELTERS  [facility]     [facility]   [address] [place]     [phone]              [employer] (women and  children)       [address] [place], [place] [address] [phone] [email or web address] for application and process Application Required  [facility]   [address]    [place] [address]     [phone]                    [facility] [address] [place], [place] [address] [phone] application appt.) Application Required  [facility] (women only)    [address]     [place], [place] [address]     [phone]      Intake starts 6pm daily Need valid ID, SSC, & Police report [employer] [address] [place], [place] [phone] Application Required  [employer] (men only)     [address]      [place], [place]     [phone]       [facility] (Pregnant women only) [address] [place], [place] [phone]  [facility]      [address]      [place], [place] [address]     [phone]             [facility] [address] [place], [place] [phone] 90 day commitment/SA/Application process  [facility](men only)     [address]     [place], [place]     [phone]       Check-in at [facility] of [facility] [address] [place], [place] [address] [phone] Men/Women/Women and Children must be there by 7 pm  [facility] [place], [place] [phone]

## 2022-12-22 NOTE — ED Notes (Signed)
Pt refused x-ray and EKG. EDP made aware.

## 2022-12-22 NOTE — ED Triage Notes (Signed)
Patient brought by EMS from bus stop. Patient was just discharged from [facility]. EMS reports near syncope at bus stop. Patient does not know how she has been sitting outside. Patient complains of SI while at the bridge and is requesting to speak to a psychiatrist. Patient states she hear voices. Patient states her plan is to "take a bunch of pills".

## 2022-12-22 NOTE — ED Notes (Addendum)
Writer attempt to collect labs on PT. While needle in PT left [facility] PT stated "blood not coming fast enough" PT request someone else to draw labs. Needle remove from arm. Half cc place in dark green tube. Not enough blood in tube for testing.

## 2022-12-23 ENCOUNTER — Encounter (HOSPITAL_COMMUNITY): Payer: Self-pay | Admitting: Registered Nurse

## 2022-12-23 ENCOUNTER — Ambulatory Visit (HOSPITAL_COMMUNITY)
Admission: EM | Admit: 2022-12-23 | Discharge: 2022-12-24 | Disposition: A | Discharge status: Home / Self Care | Payer: MEDICAID | Source: Home / Self Care

## 2022-12-23 ENCOUNTER — Other Ambulatory Visit (HOSPITAL_COMMUNITY): Payer: Self-pay

## 2022-12-23 ENCOUNTER — Ambulatory Visit (INDEPENDENT_AMBULATORY_CARE_PROVIDER_SITE_OTHER): Payer: MEDICAID | Admitting: Psychiatry

## 2022-12-23 ENCOUNTER — Encounter (HOSPITAL_COMMUNITY): Payer: Self-pay

## 2022-12-23 DIAGNOSIS — R45851 Suicidal ideations: Secondary | ICD-10-CM | POA: Diagnosis not present

## 2022-12-23 DIAGNOSIS — F3132 Bipolar disorder, current episode depressed, moderate: Secondary | ICD-10-CM | POA: Diagnosis not present

## 2022-12-23 DIAGNOSIS — I10 Essential (primary) hypertension: Secondary | ICD-10-CM | POA: Diagnosis not present

## 2022-12-23 DIAGNOSIS — F1910 Other psychoactive substance abuse, uncomplicated: Secondary | ICD-10-CM | POA: Diagnosis present

## 2022-12-23 DIAGNOSIS — E119 Type 2 diabetes mellitus without complications: Secondary | ICD-10-CM | POA: Diagnosis not present

## 2022-12-23 DIAGNOSIS — Z5982 Transportation insecurity: Secondary | ICD-10-CM | POA: Insufficient documentation

## 2022-12-23 LAB — URINALYSIS, ROUTINE W REFLEX MICROSCOPIC
Bilirubin Urine: NEGATIVE
Glucose, UA: NEGATIVE mg/dL
Hgb urine dipstick: NEGATIVE
Ketones, ur: NEGATIVE mg/dL
Leukocytes,Ua: NEGATIVE
Nitrite: NEGATIVE
Protein, ur: NEGATIVE mg/dL
Specific Gravity, Urine: 1.015 (ref 1.005–1.030)
pH: 5 (ref 5.0–8.0)

## 2022-12-23 LAB — CBC WITH DIFFERENTIAL/PLATELET
Abs Immature Granulocytes: 0 10*3/uL (ref 0.00–0.07)
Basophils Absolute: 0 10*3/uL (ref 0.0–0.1)
Basophils Relative: 1 %
Eosinophils Absolute: 0.2 10*3/uL (ref 0.0–0.5)
Eosinophils Relative: 5 %
HCT: 29.4 % — ABNORMAL LOW (ref 36.0–46.0)
Hemoglobin: 8.7 g/dL — ABNORMAL LOW (ref 12.0–15.0)
Immature Granulocytes: 0 %
Lymphocytes Relative: 40 %
Lymphs Abs: 1.2 10*3/uL (ref 0.7–4.0)
MCH: 22.3 pg — ABNORMAL LOW (ref 26.0–34.0)
MCHC: 29.6 g/dL — ABNORMAL LOW (ref 30.0–36.0)
MCV: 75.2 fL — ABNORMAL LOW (ref 80.0–100.0)
Monocytes Absolute: 0.3 10*3/uL (ref 0.1–1.0)
Monocytes Relative: 8 %
Neutro Abs: 1.5 10*3/uL — ABNORMAL LOW (ref 1.7–7.7)
Neutrophils Relative %: 46 %
Platelets: 283 10*3/uL (ref 150–400)
RBC: 3.91 MIL/uL (ref 3.87–5.11)
RDW: 20.3 % — ABNORMAL HIGH (ref 11.5–15.5)
WBC: 3.1 10*3/uL — ABNORMAL LOW (ref 4.0–10.5)
nRBC: 0 % (ref 0.0–0.2)

## 2022-12-23 LAB — COMPREHENSIVE METABOLIC PANEL
ALT: 16 U/L (ref 0–44)
AST: 14 U/L — ABNORMAL LOW (ref 15–41)
Albumin: 3.2 g/dL — ABNORMAL LOW (ref 3.5–5.0)
Alkaline Phosphatase: 57 U/L (ref 38–126)
Anion gap: 11 (ref 5–15)
BUN: 13 mg/dL (ref 6–20)
CO2: 25 mmol/L (ref 22–32)
Calcium: 8.8 mg/dL — ABNORMAL LOW (ref 8.9–10.3)
Chloride: 104 mmol/L (ref 98–111)
Creatinine, Ser: 0.84 mg/dL (ref 0.44–1.00)
GFR, Estimated: >60 mL/min (ref >60)
Glucose, Bld: 86 mg/dL (ref 70–99)
Potassium: 4.1 mmol/L (ref 3.5–5.1)
Sodium: 140 mmol/L (ref 135–145)
Total Bilirubin: 0.3 mg/dL (ref 0.3–1.2)
Total Protein: 6.2 g/dL — ABNORMAL LOW (ref 6.5–8.1)

## 2022-12-23 LAB — POCT URINE DRUG SCREEN - MANUAL ENTRY (I-SCREEN)
POC Amphetamine UR: NOT DETECTED
POC Buprenorphine (BUP): NOT DETECTED
POC Cocaine UR: POSITIVE — AB
POC Marijuana UR: POSITIVE — AB
POC Methadone UR: NOT DETECTED
POC Methamphetamine UR: NOT DETECTED
POC Morphine: NOT DETECTED
POC Oxazepam (BZO): NOT DETECTED
POC Oxycodone UR: NOT DETECTED
POC Secobarbital (BAR): NOT DETECTED

## 2022-12-23 LAB — TSH: TSH: 1.391 u[IU]/mL (ref 0.350–4.500)

## 2022-12-23 LAB — LIPID PANEL
Cholesterol: 166 mg/dL (ref 0–200)
HDL: 51 mg/dL (ref >40)
LDL Cholesterol: 95 mg/dL (ref 0–99)
Total CHOL/HDL Ratio: 3.3 RATIO
Triglycerides: 99 mg/dL (ref <150)
VLDL: 20 mg/dL (ref 0–40)

## 2022-12-23 LAB — HEMOGLOBIN A1C
Hgb A1c MFr Bld: 6.3 % — ABNORMAL HIGH (ref 4.8–5.6)
Mean Plasma Glucose: 134.11 mg/dL

## 2022-12-23 LAB — MAGNESIUM: Magnesium: 1.8 mg/dL (ref 1.7–2.4)

## 2022-12-23 LAB — ETHANOL: Alcohol, Ethyl (B): <10 mg/dL (ref <10)

## 2022-12-23 LAB — POC URINE PREG, ED: Preg Test, Ur: NEGATIVE

## 2022-12-23 MED ORDER — PANTOPRAZOLE SODIUM 40 MG PO TBEC
40.0000 mg | DELAYED_RELEASE_TABLET | Freq: Every day | ORAL | Status: DC
  Administered 2022-12-23 – 2022-12-24 (×2): 40 mg via ORAL
  Filled 2022-12-23 (×2): qty 1

## 2022-12-23 MED ORDER — ROSUVASTATIN CALCIUM 20 MG PO TABS
40.0000 mg | ORAL_TABLET | Freq: Every day | ORAL | Status: DC
  Administered 2022-12-23 – 2022-12-24 (×2): 40 mg via ORAL
  Filled 2022-12-23 (×3): qty 2

## 2022-12-23 MED ORDER — POTASSIUM CHLORIDE CRYS ER 20 MEQ PO TBCR
20.0000 meq | EXTENDED_RELEASE_TABLET | Freq: Two times a day (BID) | ORAL | Status: DC
  Administered 2022-12-23 – 2022-12-24 (×2): 20 meq via ORAL
  Filled 2022-12-23 (×2): qty 1

## 2022-12-23 MED ORDER — METFORMIN HCL 500 MG PO TABS
500.0000 mg | ORAL_TABLET | Freq: Two times a day (BID) | ORAL | Status: DC
  Administered 2022-12-23 – 2022-12-24 (×2): 500 mg via ORAL
  Filled 2022-12-23 (×2): qty 1

## 2022-12-23 MED ORDER — ALUM & MAG HYDROXIDE-SIMETH 200-200-20 MG/5ML PO SUSP
30.0000 mL | ORAL | Status: DC | PRN
  Administered 2022-12-23: 30 mL via ORAL
  Filled 2022-12-23: qty 30

## 2022-12-23 MED ORDER — HYDROXYZINE HCL 25 MG PO TABS
25.0000 mg | ORAL_TABLET | Freq: Three times a day (TID) | ORAL | Status: DC | PRN
  Administered 2022-12-23: 25 mg via ORAL
  Filled 2022-12-23: qty 1

## 2022-12-23 MED ORDER — ASPIRIN 81 MG PO TBEC
81.0000 mg | DELAYED_RELEASE_TABLET | Freq: Every day | ORAL | Status: DC
  Administered 2022-12-23 – 2022-12-24 (×2): 81 mg via ORAL
  Filled 2022-12-23 (×2): qty 1

## 2022-12-23 MED ORDER — LOSARTAN POTASSIUM 50 MG PO TABS
100.0000 mg | ORAL_TABLET | Freq: Every day | ORAL | Status: DC
  Administered 2022-12-23 – 2022-12-24 (×2): 100 mg via ORAL
  Filled 2022-12-23 (×3): qty 2

## 2022-12-23 MED ORDER — AMLODIPINE BESYLATE 10 MG PO TABS
10.0000 mg | ORAL_TABLET | Freq: Every day | ORAL | Status: DC
  Administered 2022-12-23 – 2022-12-24 (×2): 10 mg via ORAL
  Filled 2022-12-23 (×2): qty 1

## 2022-12-23 MED ORDER — NICOTINE POLACRILEX 2 MG MT GUM: 2.0000 mg | CHEWING_GUM | OROMUCOSAL | Status: DC | PRN

## 2022-12-23 MED ORDER — FERROUS SULFATE 325 (65 FE) MG PO TABS
325.0000 mg | ORAL_TABLET | ORAL | Status: DC
  Administered 2022-12-23: 325 mg via ORAL
  Filled 2022-12-23: qty 1

## 2022-12-23 MED ORDER — DIPHENHYDRAMINE HCL 50 MG PO CAPS
50.0000 mg | ORAL_CAPSULE | Freq: Once | ORAL | Status: AC
  Administered 2022-12-23: 50 mg via ORAL
  Filled 2022-12-23: qty 1

## 2022-12-23 MED ORDER — TRAZODONE HCL 50 MG PO TABS
50.0000 mg | ORAL_TABLET | Freq: Every evening | ORAL | Status: DC | PRN
  Administered 2022-12-23: 50 mg via ORAL
  Filled 2022-12-23: qty 1

## 2022-12-23 MED ORDER — ARIPIPRAZOLE 10 MG PO TABS
10.0000 mg | ORAL_TABLET | Freq: Once | ORAL | Status: AC
  Administered 2022-12-23: 10 mg via ORAL
  Filled 2022-12-23: qty 1

## 2022-12-23 MED ORDER — MAGNESIUM HYDROXIDE 400 MG/5ML PO SUSP: 30.0000 mL | Freq: Every day | ORAL | Status: DC | PRN

## 2022-12-23 MED ORDER — ARIPIPRAZOLE ER 400 MG IM SRER: 400.0000 mg | INTRAMUSCULAR | 0 refills | Status: DC

## 2022-12-23 MED ORDER — ACETAMINOPHEN 325 MG PO TABS: 650.0000 mg | ORAL_TABLET | Freq: Four times a day (QID) | ORAL | Status: DC | PRN

## 2022-12-23 MED ORDER — DIPHENHYDRAMINE HCL 50 MG/ML IJ SOLN: 50.0000 mg | Freq: Once | INTRAMUSCULAR | Status: AC

## 2022-12-23 MED ORDER — ARIPIPRAZOLE ER 400 MG IM PRSY
400.0000 mg | PREFILLED_SYRINGE | Freq: Once | INTRAMUSCULAR | Status: AC
  Administered 2022-12-23: 400 mg via INTRAMUSCULAR

## 2022-12-23 NOTE — ED Notes (Signed)
Pt  admitted to obs.  Denying  SI/HI/ endorses AVH. Calm, cooperative throughout interview process. Skin assessment completed. Oriented to unit. Meal and drink offered.  Pt verbally contract for safety. Will monitor for safety.
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
## 2022-12-23 NOTE — ED Provider Notes (Signed)
[facility] Continuous Assessment Admission H&P  Date: 12/23/22 Patient Name: Kaitlyn Gray MRN: [id number] Chief Complaint: Medication management, auditory/visual hallucinations, suicidal ideation  Diagnoses:  Final diagnoses:  Bipolar affective disorder, currently depressed, moderate (HCC)  Inability to access community resources due to transportation insecurity  Polysubstance abuse [facility])    HPI: Kaitlyn Gray 47 yr. old female with a psychiatric history of bipolar affective disorder, polysubstance use (marijuana and cocaine) presents to [facility] from [facility] where she initially presented for her outpatient psychiatric appointment for medication management initial intake.  Patient reported to outpatient provider that she was having auditory hallucinations with voices telling her to kill herself.  Visual hallucinations of seeing people in the wall.  Informed that she has been taking Seroquel 200 mg nightly (an old medication) that helps somewhat.  Informed if she was to be sent home that she would overdose on Seroquel.    Provider Kaitlyn Cookey, DNP wants patient admitted to continuous observation unit for safety and to start Abilify Maintena.    Kaitlyn Gray seen face to face by this provider, chart reviewed, and consulted with Kaitlyn Gray on 12/23/22.  On evaluation Kaitlyn Gray continues endorse auditory hallucinations stating she is hearing voices telling her to kill herself.  Today she is endorsing visual hallucinations stating that she is seeing people in the walls that call her ugly and to lay in traffic.  Patient states that she is having suicidal thoughts but doesn't want to die.  States that voices are telling her to kill herself and that she doesn't feel safe at home and if discharged home she will take all of her Seroquel until she doesn't wake.  Patient states that she is not sleeping well related to the voices and states that the voices are worse when she is  home alone.  She reports that she is eating without difficulty.  Patient has a history of polysubstance abuse (cocaine and marijuana).  States her last use was "a week ago."  Reports that she doesn't currently have any money to buy any.  Patient reporting that her stressors are not having food to eat, financial instability related to not having her check.  Informed outpatient that she does have a payee that handles her money but wanted a letter stating that she could handle her own money.  Patient also reports that she has a problem with transportation.  Will also have social work to [employer] transport to patient.    Patient states she lives alone in an apartment and has no support. Patient has 2 are in foster care and the older 2 has no contact with her.   During evaluation Kaitlyn Gray standing in exam room upset related to being given another walker to use while on assessment unit.  Stating that she doesn't want a different walker.  Informed that walker given is for safety reasons.  She is also upset because she wants to go on unit not liking being in exam room alone.  Informed once labs and EKG is done she will be taken on unit. Patient is oriented x 4, and there is no noted distress.  She is somewhat calm with irritability.  Her responses are appropriate to assessment questions.  She spoke in a clear tone at moderate volume, and normal pace, with good eye contact.   She is endorsing auditory/visual hallucinations, and suicidal ideation. Objectively there is no evidence of psychosis/mania or delusional thinking other than patients endorsement.  She conversed coherently, with goal directed thoughts, no distractibility, or pre-occupation.  Patient was seen at [facility] yesterday after being discharged from [facility] ED.  Patient then went to [facility] ED and back to [facility] making a total of 4 visits between ED and UC on 12/22/22.  History of multiple visits to ED or UC with similar complaints.  Consult with social  work to refer to Kaitlyn Gray or Kaitlyn Gray services prior to her discharge.    Total Time spent with patient: 45 minutes  Musculoskeletal  Strength & Muscle Tone:  Left side weakness related to prior stroke Gait & Station:  Uses rolling walker to assist with ambulation Patient leans: N/A  Psychiatric Specialty Exam  Presentation General Appearance:  Appropriate for Environment  Eye Contact: Good  Speech: Clear and Coherent  Speech Volume: Normal  Handedness: Right   Mood and Affect  Mood: Anxious; Dysphoric  Affect: Congruent   Thought Process  Thought Processes: Linear; Coherent  Descriptions of Associations:Intact  Orientation:Full (Time, Place and Person)  Thought Content:Logical  Diagnosis of Schizophrenia or Schizoaffective disorder in past: No  Duration of Psychotic Symptoms: N/A  Hallucinations:Hallucinations: Auditory Description of Auditory Hallucinations: States she is hearing voices telling her to hurt herself, that she is ugly, and to lay in traffic Description of Visual Hallucinations: Reports she is seeing people in the walls  Ideas of Reference:None  Suicidal Thoughts:Suicidal Thoughts: Yes, Passive SI Active Intent and/or Plan: Without Intent (Stating if she is sent home she will take her seroquel until she doesn't wake up.  Chronic suicidal ideation.  Reports she really doesn't want to die)  Homicidal Thoughts:Homicidal Thoughts: No   Sensorium  Memory: Immediate Fair; Recent Fair; Remote Fair  Judgment: Intact  Insight: Fair   Art therapist  Concentration: Good  Attention Span: Good  Recall: [employer] of Knowledge: Fair  Language: Fair   Psychomotor Activity  Psychomotor Activity: Psychomotor Activity: Normal   Assets  Assets: Communication Skills; Desire for Improvement; Financial Resources/Insurance; Housing   Sleep  Sleep: Sleep: Poor Number of Hours of Sleep: 0 (Today she reports that she is not  sleeping well related to the voices)   Nutritional Assessment (For OBS and FBC admissions only) Has the patient had a weight loss or gain of 10 pounds or more in the last 3 months?: No Has the patient had a decrease in food intake/or appetite?: No Does the patient have dental problems?: No Does the patient have eating habits or behaviors that may be indicators of an eating disorder including binging or inducing vomiting?: No Has the patient recently lost weight without trying?: 0 Has the patient been eating poorly because of a decreased appetite?: 0 Malnutrition Screening Tool Score: 0    Physical Exam Review of Systems  Musculoskeletal:        Left side weakness  Neurological:        History of 3 strikes  Psychiatric/Behavioral:  Positive for depression, hallucinations, substance abuse and suicidal ideas. The patient is nervous/anxious and has insomnia.     Blood pressure (!) 145/78, pulse 62, temperature 98.5 F (36.9 C), temperature source Oral, resp. rate 18, SpO2 100 %. There is no height or weight on file to calculate BMI.  Past Psychiatric History: bipolar affective disorder, polysubstance use (marijuana and cocaine)   Is the patient at risk to self? Yes  Has the patient been a risk to self in the past 6 months? Yes .    Has the patient been  a risk to self within the distant past? No   Is the patient a risk to others? No   Has the patient been a risk to others in the past 6 months? No   Has the patient been a risk to others within the distant past? 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) 07/19/2017   Hypertension    Hypertensive emergency 05/31/2022   Ischemic stroke (HCC) 01/01/2020   subacute right middle cerebellar peduncle and pons infarction   Left-sided weakness 01/27/2022   Head CT with remote right occipital infarct which is consistent with old left-sided weakness   MDD (major depressive disorder), recurrent severe, 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   Normocytic anemia 02/09/2020   Opiate use    History of Suboxone Therapy until 05/2021   Polysubstance abuse (HCC) 07/19/2017   Prescription drug misadventures (Seroquel)      Family History:  Family History  Problem Relation Age of Onset   Hypertension Mother    CAD Mother 46       died of MI at age 40   Hypertension Father      Social History:  Lives alone, on disability, 4 children (younger 2 foster care, older 2 no contact), no transportation Social History   Tobacco Use   Smoking status: Some Days    Packs/day: .5    Types: Cigarettes    Passive exposure: Current   Smokeless tobacco: Former  Building services engineer Use: Never used  Substance Use Topics   Alcohol use: No   Drug use: Yes    Types: Marijuana, Cocaine     Last Labs:  Admission on 12/22/2022, Discharged on 12/23/2022  Component Date Value Ref Range Status   Sodium 12/22/2022 138  135 - 145 mmol/L Final   Potassium 12/22/2022 3.4 (L)  3.5 - 5.1 mmol/L Final   Chloride 12/22/2022 107  98 - 111 mmol/L Final   CO2 12/22/2022 23  22 - 32 mmol/L Final   Glucose, Bld 12/22/2022 102 (H)  70 - 99 mg/dL Final   Glucose reference range applies only to samples taken after fasting for at least 8 hours.   BUN 12/22/2022 17  6 - 20 mg/dL Final   Creatinine, Ser 12/22/2022 0.90  0.44 - 1.00 mg/dL Final   Calcium 40/98/1191 9.2  8.9 - 10.3 mg/dL Final   Total Protein  12/22/2022 7.1  6.5 - 8.1 g/dL Final   Albumin 47/82/9562 3.2 (L)  3.5 - 5.0 g/dL Final   AST 13/01/6577 15  15 - 41 U/L Final   ALT 12/22/2022 13  0 - 44 U/L Final   Alkaline Phosphatase 12/22/2022 59  38 - 126 U/L Final   Total Bilirubin 12/22/2022 0.2 (L)  0.3 - 1.2 mg/dL Final   GFR, Estimated 12/22/2022 >60  >60 mL/min Final   Comment: (NOTE) Calculated using the CKD-EPI Creatinine Equation (2021)    Anion gap 12/22/2022 8  5 - 15 Final   Performed at [facility], [address], [place], [place] [address]   Alcohol, Ethyl (B) 12/22/2022 <10  <10 mg/dL Final   Comment: (NOTE) Lowest detectable limit for serum alcohol is 10 mg/dL.  For medical purposes only. Performed at [facility], [address], [place], [place] [address]    Salicylate Lvl 12/22/2022 <7.0 (L)  7.0 - 30.0 mg/dL Final   Performed at [facility], [address] [place], [place] [address]   Acetaminophen (Tylenol), Serum 12/22/2022 <10 (L)  10 - 30 ug/mL Final   Comment: (NOTE) Therapeutic concentrations vary significantly. A range of 10-30 ug/mL  may be an effective concentration for many patients. However, some  are best treated at concentrations outside of this range. Acetaminophen concentrations >150 ug/mL at 4 hours after ingestion  and >50 ug/mL at 12 hours after ingestion are often associated with  toxic reactions.  Performed at [facility], [address] [place], [place] [address]    WBC 12/22/2022 3.2 (L)  4.0 - 10.5 K/uL Final   RBC 12/22/2022 4.01  3.87 - 5.11 MIL/uL Final   Hemoglobin 12/22/2022 8.8 (L)  12.0 - 15.0 g/dL Final   Comment: Reticulocyte Hemoglobin testing may be clinically indicated, consider ordering this additional test [id number]    HCT 12/22/2022 30.3 (L)  36.0 - 46.0 % Final   MCV 12/22/2022 75.6 (L)  80.0 - 100.0 fL Final   MCH 12/22/2022 21.9 (L)  26.0 - 34.0 pg Final   MCHC 12/22/2022 29.0 (L)  30.0 - 36.0 g/dL Final   RDW 43/32/9518 20.2  (H)  11.5 - 15.5 % Final   Platelets 12/22/2022 297  150 - 400 K/uL Final   nRBC 12/22/2022 0.0  0.0 - 0.2 % Final   Performed at [facility], [address], [place], [place] [address]   Preg, Serum 12/22/2022 NEGATIVE  NEGATIVE Final   Comment:        THE SENSITIVITY OF THIS METHODOLOGY IS >10 mIU/mL. Performed at [facility], [address] [place], [place] [address]    Glucose-Capillary 12/22/2022 131 (H)  70 - 99 mg/dL Final   Glucose reference range applies only to samples taken after fasting for at least 8 hours.  Admission on 12/21/2022, Discharged on 12/22/2022  Component Date Value Ref Range Status   WBC 12/22/2022 3.8 (L)  4.0 - 10.5 K/uL Final   RBC 12/22/2022 3.60 (L)  3.87 - 5.11 MIL/uL Final   Hemoglobin 12/22/2022 8.1 (L)  12.0 - 15.0 g/dL Final   Comment: Reticulocyte Hemoglobin testing may be clinically indicated, consider ordering this additional test [id number]    HCT 12/22/2022 27.6 (L)  36.0 - 46.0 % Final   MCV 12/22/2022 76.7 (L)  80.0 - 100.0 fL Final   MCH 12/22/2022 22.5 (L)  26.0 - 34.0 pg Final   MCHC 12/22/2022 29.3 (L)  30.0 - 36.0 g/dL Final   RDW 55/73/2202 20.2 (H)  11.5 - 15.5 % Final   Platelets 12/22/2022 294  150 - 400 K/uL Final   nRBC 12/22/2022 0.0  0.0 - 0.2 % Final   Neutrophils Relative % 12/22/2022 55  % Final   Neutro Abs 12/22/2022 2.1  1.7 - 7.7 K/uL Final   Lymphocytes Relative 12/22/2022 34  % Final   Lymphs Abs 12/22/2022 1.3  0.7 - 4.0 K/uL Final   Monocytes Relative 12/22/2022 7  % Final  Monocytes Absolute 12/22/2022 0.3  0.1 - 1.0 K/uL Final   Eosinophils Relative 12/22/2022 3  % Final   Eosinophils Absolute 12/22/2022 0.1  0.0 - 0.5 K/uL Final   Basophils Relative 12/22/2022 1  % Final   Basophils Absolute 12/22/2022 0.0  0.0 - 0.1 K/uL Final   Immature Granulocytes 12/22/2022 0  % Final   Abs Immature Granulocytes 12/22/2022 0.01  0.00 - 0.07 K/uL Final   Performed at [facility], [address], [place], [place] [address]   Sodium 12/22/2022 138  135 - 145 mmol/L Final   Potassium 12/22/2022 2.9 (L)  3.5 - 5.1 mmol/L Final   Chloride 12/22/2022 106  98 - 111 mmol/L Final   CO2 12/22/2022 25  22 - 32 mmol/L Final   Glucose, Bld 12/22/2022 147 (H)  70 - 99 mg/dL Final   Glucose reference range applies only to samples taken after fasting for at least 8 hours.   BUN 12/22/2022 17  6 - 20 mg/dL Final   Creatinine, Ser 12/22/2022 0.92  0.44 - 1.00 mg/dL Final   Calcium 47/82/9562 8.5 (L)  8.9 - 10.3 mg/dL Final   Total Protein 13/01/6577 6.3 (L)  6.5 - 8.1 g/dL Final   Albumin 46/96/2952 3.0 (L)  3.5 - 5.0 g/dL Final   AST 84/13/2440 11 (L)  15 - 41 U/L Final   ALT 12/22/2022 11  0 - 44 U/L Final   Alkaline Phosphatase 12/22/2022 53  38 - 126 U/L Final   Total Bilirubin 12/22/2022 0.1 (L)  0.3 - 1.2 mg/dL Final   GFR, Estimated 12/22/2022 >60  >60 mL/min Final   Comment: (NOTE) Calculated using the CKD-EPI Creatinine Equation (2021)    Anion gap 12/22/2022 7  5 - 15 Final   Performed at [facility], [address] [place], [place] [address]   Alcohol, Ethyl (B) 12/22/2022 <10  <10 mg/dL Final   Comment: (NOTE) Lowest detectable limit for serum alcohol is 10 mg/dL.  For medical purposes only. Performed at [facility], [address] [place], [place] [address]    Acetaminophen (Tylenol), Serum 12/22/2022 <10 (L)  10 - 30 ug/mL Final   Comment: (NOTE) Therapeutic concentrations vary significantly. A range of 10-30 ug/mL  may be an effective concentration for many patients. However, some  are best treated at concentrations outside of this range. Acetaminophen concentrations >150 ug/mL at 4 hours after ingestion  and >50 ug/mL at 12 hours after ingestion are often associated with  toxic reactions.  Performed at [facility], [address] [place], [place] [address]    Salicylate Lvl 12/22/2022 <7.0 (L)   7.0 - 30.0 mg/dL Final   Performed at [facility], [address], [place], [place] [address]  Admission on 12/20/2022, Discharged on 12/21/2022  Component Date Value Ref Range Status   Glucose-Capillary 12/20/2022 91  70 - 99 mg/dL Final   Glucose reference range applies only to samples taken after fasting for at least 8 hours.   Sodium 12/20/2022 143  135 - 145 mmol/L Final   Potassium 12/20/2022 3.1 (L)  3.5 - 5.1 mmol/L Final   Chloride 12/20/2022 106  98 - 111 mmol/L Final   CO2 12/20/2022 24  22 - 32 mmol/L Final   Glucose, Bld 12/20/2022 101 (H)  70 - 99 mg/dL Final   Glucose reference range applies only to samples taken after fasting for at least 8 hours.  BUN 12/20/2022 21 (H)  6 - 20 mg/dL Final   Creatinine, Ser 12/20/2022 1.08 (H)  0.44 - 1.00 mg/dL Final   Calcium 16/03/9603 8.9  8.9 - 10.3 mg/dL Final   Total Protein 54/02/8118 6.3 (L)  6.5 - 8.1 g/dL Final   Albumin 14/78/2956 3.1 (L)  3.5 - 5.0 g/dL Final   AST 21/30/8657 16  15 - 41 U/L Final   ALT 12/20/2022 12  0 - 44 U/L Final   Alkaline Phosphatase 12/20/2022 56  38 - 126 U/L Final   Total Bilirubin 12/20/2022 0.7  0.3 - 1.2 mg/dL Final   GFR, Estimated 12/20/2022 >60  >60 mL/min Final   Comment: (NOTE) Calculated using the CKD-EPI Creatinine Equation (2021)    Anion gap 12/20/2022 13  5 - 15 Final   Performed at [facility], [address] [place], [place] [address]   Salicylate Lvl 12/20/2022 <7.0 (L)  7.0 - 30.0 mg/dL Final   Performed at [facility], [address], [place], [place] [address]   Acetaminophen (Tylenol), Serum 12/20/2022 <10 (L)  10 - 30 ug/mL Final   Comment: (NOTE) Therapeutic concentrations vary significantly. A range of 10-30 ug/mL  may be an effective concentration for many patients. However, some  are best treated at concentrations outside of this range. Acetaminophen concentrations >150 ug/mL at 4 hours after ingestion  and >50 ug/mL at 12 hours  after ingestion are often associated with  toxic reactions.  Performed at [facility], [address] [place], [place] [address]    Alcohol, Ethyl (B) 12/20/2022 <10  <10 mg/dL Final   Comment: (NOTE) Lowest detectable limit for serum alcohol is 10 mg/dL.  For medical purposes only. Performed at [facility], [address], [place], [place] [address]    WBC 12/20/2022 5.5  4.0 - 10.5 K/uL Final   RBC 12/20/2022 3.81 (L)  3.87 - 5.11 MIL/uL Final   Hemoglobin 12/20/2022 8.4 (L)  12.0 - 15.0 g/dL Final   Comment: Reticulocyte Hemoglobin testing may be clinically indicated, consider ordering this additional test [id number]    HCT 12/20/2022 28.9 (L)  36.0 - 46.0 % Final   MCV 12/20/2022 75.9 (L)  80.0 - 100.0 fL Final   MCH 12/20/2022 22.0 (L)  26.0 - 34.0 pg Final   MCHC 12/20/2022 29.1 (L)  30.0 - 36.0 g/dL Final   RDW 03/47/4259 20.1 (H)  11.5 - 15.5 % Final   Platelets 12/20/2022 289  150 - 400 K/uL Final   nRBC 12/20/2022 0.0  0.0 - 0.2 % Final   Neutrophils Relative % 12/20/2022 66  % Final   Neutro Abs 12/20/2022 3.6  1.7 - 7.7 K/uL Final   Lymphocytes Relative 12/20/2022 25  % Final   Lymphs Abs 12/20/2022 1.4  0.7 - 4.0 K/uL Final   Monocytes Relative 12/20/2022 6  % Final   Monocytes Absolute 12/20/2022 0.3  0.1 - 1.0 K/uL Final   Eosinophils Relative 12/20/2022 2  % Final   Eosinophils Absolute 12/20/2022 0.1  0.0 - 0.5 K/uL Final   Basophils Relative 12/20/2022 1  % Final   Basophils Absolute 12/20/2022 0.0  0.0 - 0.1 K/uL Final   Immature Granulocytes 12/20/2022 0  % Final   Abs Immature Granulocytes 12/20/2022 0.01  0.00 - 0.07 K/uL Final   Performed at [facility], [address] [place], [place] [address]   Preg, Serum 12/20/2022 NEGATIVE  NEGATIVE Final  Comment:        THE SENSITIVITY OF THIS METHODOLOGY IS >10 mIU/mL. Performed at [facility], [address] [place], [place] [address]    Color, Urine 12/21/2022 YELLOW   YELLOW Final   APPearance 12/21/2022 HAZY (A)  CLEAR Final   Specific Gravity, Urine 12/21/2022 1.014  1.005 - 1.030 Final   pH 12/21/2022 5.0  5.0 - 8.0 Final   Glucose, UA 12/21/2022 NEGATIVE  NEGATIVE mg/dL Final   Hgb urine dipstick 12/21/2022 NEGATIVE  NEGATIVE Final   Bilirubin Urine 12/21/2022 NEGATIVE  NEGATIVE Final   Ketones, ur 12/21/2022 NEGATIVE  NEGATIVE mg/dL Final   Protein, ur 60/45/4098 NEGATIVE  NEGATIVE mg/dL Final   Nitrite 11/91/4782 NEGATIVE  NEGATIVE Final   Leukocytes,Ua 12/21/2022 NEGATIVE  NEGATIVE Final   Performed at [facility], [address], [place], [place] [address]  Admission on 12/17/2022, Discharged on 12/17/2022  Component Date Value Ref Range Status   WBC 12/17/2022 5.6  4.0 - 10.5 K/uL Final   RBC 12/17/2022 3.81 (L)  3.87 - 5.11 MIL/uL Final   Hemoglobin 12/17/2022 8.4 (L)  12.0 - 15.0 g/dL Final   Comment: Reticulocyte Hemoglobin testing may be clinically indicated, consider ordering this additional test [id number]    HCT 12/17/2022 29.3 (L)  36.0 - 46.0 % Final   MCV 12/17/2022 76.9 (L)  80.0 - 100.0 fL Final   MCH 12/17/2022 22.0 (L)  26.0 - 34.0 pg Final   MCHC 12/17/2022 28.7 (L)  30.0 - 36.0 g/dL Final   RDW 69/62/9528 20.1 (H)  11.5 - 15.5 % Final   Platelets 12/17/2022 225  150 - 400 K/uL Final   nRBC 12/17/2022 0.0  0.0 - 0.2 % Final   Performed at [facility], [address], [place], [place] [address]   Neutrophils Relative % 12/17/2022 66  % Final   Neutro Abs 12/17/2022 3.7  1.7 - 7.7 K/uL Final   Lymphocytes Relative 12/17/2022 24  % Final   Lymphs Abs 12/17/2022 1.3  0.7 - 4.0 K/uL Final   Monocytes Relative 12/17/2022 7  % Final   Monocytes Absolute 12/17/2022 0.4  0.1 - 1.0 K/uL Final   Eosinophils Relative 12/17/2022 2  % Final   Eosinophils Absolute 12/17/2022 0.1  0.0 - 0.5 K/uL Final   Basophils Relative 12/17/2022 1  % Final   Basophils Absolute 12/17/2022 0.0  0.0 - 0.1 K/uL Final   Immature  Granulocytes 12/17/2022 0  % Final   Abs Immature Granulocytes 12/17/2022 0.02  0.00 - 0.07 K/uL Final   Performed at [facility], [address], [place], [place] [address]   Sodium 12/17/2022 135  135 - 145 mmol/L Final   Potassium 12/17/2022 3.6  3.5 - 5.1 mmol/L Final   Chloride 12/17/2022 105  98 - 111 mmol/L Final   CO2 12/17/2022 21 (L)  22 - 32 mmol/L Final   Glucose, Bld 12/17/2022 108 (H)  70 - 99 mg/dL Final   Glucose reference range applies only to samples taken after fasting for at least 8 hours.   BUN 12/17/2022 11  6 - 20 mg/dL Final   Creatinine, Ser 12/17/2022 0.78  0.44 - 1.00 mg/dL Final   Calcium 72/53/6644 8.3 (L)  8.9 - 10.3 mg/dL Final   Total Protein 03/47/4259 6.6  6.5 - 8.1 g/dL Final   Albumin 56/38/7564 3.2 (L)  3.5 - 5.0 g/dL Final   AST 33/29/5188 21  15 -  41 U/L Final   ALT 12/17/2022 15  0 - 44 U/L Final   Alkaline Phosphatase 12/17/2022 59  38 - 126 U/L Final   Total Bilirubin 12/17/2022 0.4  0.3 - 1.2 mg/dL Final   GFR, Estimated 12/17/2022 >60  >60 mL/min Final   Comment: (NOTE) Calculated using the CKD-EPI Creatinine Equation (2021)    Anion gap 12/17/2022 9  5 - 15 Final   Performed at [facility], [address] [place], [place] [address]   Alcohol, Ethyl (B) 12/17/2022 <10  <10 mg/dL Final   Comment: (NOTE) Lowest detectable limit for serum alcohol is 10 mg/dL.  For medical purposes only. Performed at [facility], [address] [place], [place] [address]    Preg, Serum 12/17/2022 NEGATIVE  NEGATIVE Final   Performed at [facility], [address] [place], [place] [address]   Sodium 12/17/2022 139  135 - 145 mmol/L Final   Potassium 12/17/2022 3.5  3.5 - 5.1 mmol/L Final   Chloride 12/17/2022 104  98 - 111 mmol/L Final   BUN 12/17/2022 12  6 - 20 mg/dL Final   Creatinine, Ser 12/17/2022 0.90  0.44 - 1.00 mg/dL Final   Glucose, Bld 91/47/8295 108 (H)  70 - 99 mg/dL Final   Glucose reference range applies only  to samples taken after fasting for at least 8 hours.   Calcium, Ion 12/17/2022 1.15  1.15 - 1.40 mmol/L Final   TCO2 12/17/2022 25  22 - 32 mmol/L Final   Hemoglobin 12/17/2022 10.2 (L)  12.0 - 15.0 g/dL Final   HCT 62/13/0865 30.0 (L)  36.0 - 46.0 % Final  Admission on 12/13/2022, Discharged on 12/14/2022  Component Date Value Ref Range Status   Sodium 12/13/2022 140  135 - 145 mmol/L Final   Potassium 12/13/2022 3.1 (L)  3.5 - 5.1 mmol/L Final   Chloride 12/13/2022 110  98 - 111 mmol/L Final   CO2 12/13/2022 23  22 - 32 mmol/L Final   Glucose, Bld 12/13/2022 105 (H)  70 - 99 mg/dL Final   Glucose reference range applies only to samples taken after fasting for at least 8 hours.   BUN 12/13/2022 12  6 - 20 mg/dL Final   Creatinine, Ser 12/13/2022 0.82  0.44 - 1.00 mg/dL Final   Calcium 78/46/9629 8.7 (L)  8.9 - 10.3 mg/dL Final   Total Protein 52/84/1324 6.9  6.5 - 8.1 g/dL Final   Albumin 40/03/2724 3.3 (L)  3.5 - 5.0 g/dL Final   AST 36/64/4034 16  15 - 41 U/L Final   ALT 12/13/2022 15  0 - 44 U/L Final   Alkaline Phosphatase 12/13/2022 67  38 - 126 U/L Final   Total Bilirubin 12/13/2022 0.6  0.3 - 1.2 mg/dL Final   GFR, Estimated 12/13/2022 >60  >60 mL/min Final   Comment: (NOTE) Calculated using the CKD-EPI Creatinine Equation (2021)    Anion gap 12/13/2022 7  5 - 15 Final   Performed at [facility], [address], [place], [place] [address]   Alcohol, Ethyl (B) 12/13/2022 <10  <10 mg/dL Final   Comment: (NOTE) Lowest detectable limit for serum alcohol is 10 mg/dL.  For medical purposes only. Performed at [facility], [address] [place], [place] [address]    Salicylate Lvl 12/13/2022 <7.0 (L)  7.0 - 30.0 mg/dL Final   Performed at [facility], [address], [place], [place] [address]  Acetaminophen (Tylenol), Serum 12/13/2022 <10 (L)  10 - 30 ug/mL Final   Comment: (NOTE) Therapeutic concentrations vary significantly. A range of 10-30 ug/mL   may be an effective concentration for many patients. However, some  are best treated at concentrations outside of this range. Acetaminophen concentrations >150 ug/mL at 4 hours after ingestion  and >50 ug/mL at 12 hours after ingestion are often associated with  toxic reactions.  Performed at [facility], [address] [place], [place] [address]    WBC 12/13/2022 4.3  4.0 - 10.5 K/uL Final   RBC 12/13/2022 3.75 (L)  3.87 - 5.11 MIL/uL Final   Hemoglobin 12/13/2022 8.1 (L)  12.0 - 15.0 g/dL Final   Comment: Reticulocyte Hemoglobin testing may be clinically indicated, consider ordering this additional test [id number]    HCT 12/13/2022 28.6 (L)  36.0 - 46.0 % Final   MCV 12/13/2022 76.3 (L)  80.0 - 100.0 fL Final   MCH 12/13/2022 21.6 (L)  26.0 - 34.0 pg Final   MCHC 12/13/2022 28.3 (L)  30.0 - 36.0 g/dL Final   RDW 29/56/2130 20.7 (H)  11.5 - 15.5 % Final   Platelets 12/13/2022 395  150 - 400 K/uL Final   nRBC 12/13/2022 0.0  0.0 - 0.2 % Final   Performed at [facility], [address], [place], [place] [address]   Opiates 12/13/2022 NONE DETECTED  NONE DETECTED Final   Cocaine 12/13/2022 POSITIVE (A)  NONE DETECTED Final   Benzodiazepines 12/13/2022 NONE DETECTED  NONE DETECTED Final   Amphetamines 12/13/2022 NONE DETECTED  NONE DETECTED Final   Tetrahydrocannabinol 12/13/2022 POSITIVE (A)  NONE DETECTED Final   Barbiturates 12/13/2022 NONE DETECTED  NONE DETECTED Final   Comment: (NOTE) DRUG SCREEN FOR MEDICAL PURPOSES ONLY.  IF CONFIRMATION IS NEEDED FOR ANY PURPOSE, NOTIFY LAB WITHIN 5 DAYS.  LOWEST DETECTABLE LIMITS FOR URINE DRUG SCREEN Drug Class                     Cutoff (ng/mL) Amphetamine and metabolites    1000 Barbiturate and metabolites    200 Benzodiazepine                 200 Opiates and metabolites        300 Cocaine and metabolites        300 THC                            50 Performed at [facility], [address]  [place], [place] [address]    Preg, Serum 12/13/2022 NEGATIVE  NEGATIVE Final   Performed at [facility], [address] [place], [place] [address]   Glucose-Capillary 12/13/2022 102 (H)  70 - 99 mg/dL Final   Glucose reference range applies only to samples taken after fasting for at least 8 hours.   Glucose-Capillary 12/13/2022 112 (H)  70 - 99 mg/dL Final   Glucose reference range applies only to samples taken after fasting for at least 8 hours.   Glucose-Capillary 12/13/2022 109 (H)  70 - 99 mg/dL Final   Glucose reference range applies only to samples taken after fasting for at least 8 hours.   Glucose-Capillary 12/14/2022 100 (H)  70 - 99 mg/dL Final   Glucose reference range applies only to samples taken after fasting for at least 8 hours.   Glucose-Capillary 12/13/2022 83  70 - 99 mg/dL Final  Glucose reference range applies only to samples taken after fasting for at least 8 hours.   Comment 1 12/13/2022 Notify RN   Final  Admission on 12/12/2022, Discharged on 12/12/2022  Component Date Value Ref Range Status   Sodium 12/12/2022 139  135 - 145 mmol/L Final   Potassium 12/12/2022 3.4 (L)  3.5 - 5.1 mmol/L Final   Chloride 12/12/2022 107  98 - 111 mmol/L Final   CO2 12/12/2022 22  22 - 32 mmol/L Final   Glucose, Bld 12/12/2022 98  70 - 99 mg/dL Final   Glucose reference range applies only to samples taken after fasting for at least 8 hours.   BUN 12/12/2022 14  6 - 20 mg/dL Final   Creatinine, Ser 12/12/2022 0.95  0.44 - 1.00 mg/dL Final   Calcium 32/44/0102 8.7 (L)  8.9 - 10.3 mg/dL Final   Total Protein 72/53/6644 7.3  6.5 - 8.1 g/dL Final   Albumin 03/47/4259 3.4 (L)  3.5 - 5.0 g/dL Final   AST 56/38/7564 14 (L)  15 - 41 U/L Final   ALT 12/12/2022 13  0 - 44 U/L Final   Alkaline Phosphatase 12/12/2022 70  38 - 126 U/L Final   Total Bilirubin 12/12/2022 0.3  0.3 - 1.2 mg/dL Final   GFR, Estimated 12/12/2022 >60  >60 mL/min Final   Comment: (NOTE) Calculated using the  CKD-EPI Creatinine Equation (2021)    Anion gap 12/12/2022 10  5 - 15 Final   Performed at [facility], [address] [place], [place] [address]   WBC 12/12/2022 4.6  4.0 - 10.5 K/uL Final   RBC 12/12/2022 3.72 (L)  3.87 - 5.11 MIL/uL Final   Hemoglobin 12/12/2022 8.2 (L)  12.0 - 15.0 g/dL Final   Comment: Reticulocyte Hemoglobin testing may be clinically indicated, consider ordering this additional test [id number]    HCT 12/12/2022 28.2 (L)  36.0 - 46.0 % Final   MCV 12/12/2022 75.8 (L)  80.0 - 100.0 fL Final   MCH 12/12/2022 22.0 (L)  26.0 - 34.0 pg Final   MCHC 12/12/2022 29.1 (L)  30.0 - 36.0 g/dL Final   RDW 63/06/6008 20.6 (H)  11.5 - 15.5 % Final   Platelets 12/12/2022 379  150 - 400 K/uL Final   nRBC 12/12/2022 0.0  0.0 - 0.2 % Final   Neutrophils Relative % 12/12/2022 62  % Final   Neutro Abs 12/12/2022 2.9  1.7 - 7.7 K/uL Final   Lymphocytes Relative 12/12/2022 28  % Final   Lymphs Abs 12/12/2022 1.3  0.7 - 4.0 K/uL Final   Monocytes Relative 12/12/2022 8  % Final   Monocytes Absolute 12/12/2022 0.4  0.1 - 1.0 K/uL Final   Eosinophils Relative 12/12/2022 1  % Final   Eosinophils Absolute 12/12/2022 0.0  0.0 - 0.5 K/uL Final   Basophils Relative 12/12/2022 1  % Final   Basophils Absolute 12/12/2022 0.0  0.0 - 0.1 K/uL Final   Immature Granulocytes 12/12/2022 0  % Final   Abs Immature Granulocytes 12/12/2022 0.01  0.00 - 0.07 K/uL Final   Performed at [facility], [address], [place], [place] [address]   Lipase 12/12/2022 39  11 - 51 U/L Final   Performed at [facility], [address], [place], [place] [address]  Admission on 12/07/2022, Discharged on 12/08/2022  Component Date Value Ref Range Status   WBC 12/08/2022 6.1  4.0 - 10.5 K/uL Final   RBC 12/08/2022  3.97  3.87 - 5.11 MIL/uL Final   Hemoglobin 12/08/2022 8.9 (L)  12.0 - 15.0 g/dL Final   Comment: Reticulocyte Hemoglobin testing may be clinically indicated, consider ordering this  additional test [id number]    HCT 12/08/2022 30.8 (L)  36.0 - 46.0 % Final   MCV 12/08/2022 77.6 (L)  80.0 - 100.0 fL Final   MCH 12/08/2022 22.4 (L)  26.0 - 34.0 pg Final   MCHC 12/08/2022 28.9 (L)  30.0 - 36.0 g/dL Final   RDW 54/02/8118 21.3 (H)  11.5 - 15.5 % Final   Platelets 12/08/2022 348  150 - 400 K/uL Final   nRBC 12/08/2022 0.0  0.0 - 0.2 % Final   Neutrophils Relative % 12/08/2022 61  % Final   Neutro Abs 12/08/2022 3.7  1.7 - 7.7 K/uL Final   Lymphocytes Relative 12/08/2022 29  % Final   Lymphs Abs 12/08/2022 1.8  0.7 - 4.0 K/uL Final   Monocytes Relative 12/08/2022 5  % Final   Monocytes Absolute 12/08/2022 0.3  0.1 - 1.0 K/uL Final   Eosinophils Relative 12/08/2022 4  % Final   Eosinophils Absolute 12/08/2022 0.2  0.0 - 0.5 K/uL Final   Basophils Relative 12/08/2022 1  % Final   Basophils Absolute 12/08/2022 0.0  0.0 - 0.1 K/uL Final   Immature Granulocytes 12/08/2022 0  % Final   Abs Immature Granulocytes 12/08/2022 0.00  0.00 - 0.07 K/uL Final   Target Cells 12/08/2022 PRESENT   Final   Ovalocytes 12/08/2022 PRESENT   Final   Performed at [facility], [address], [place], [place] [address]   Sodium 12/08/2022 144  135 - 145 mmol/L Final   Potassium 12/08/2022 3.2 (L)  3.5 - 5.1 mmol/L Final   Chloride 12/08/2022 109  98 - 111 mmol/L Final   CO2 12/08/2022 27  22 - 32 mmol/L Final   Glucose, Bld 12/08/2022 89  70 - 99 mg/dL Final   Glucose reference range applies only to samples taken after fasting for at least 8 hours.   BUN 12/08/2022 15  6 - 20 mg/dL Final   Creatinine, Ser 12/08/2022 0.88  0.44 - 1.00 mg/dL Final   Calcium 95/62/1308 8.7 (L)  8.9 - 10.3 mg/dL Final   Total Protein 65/78/4696 7.2  6.5 - 8.1 g/dL Final   Albumin 29/52/8413 3.5  3.5 - 5.0 g/dL Final   AST 24/40/1027 13 (L)  15 - 41 U/L Final   ALT 12/08/2022 14  0 - 44 U/L Final   Alkaline Phosphatase 12/08/2022 63  38 - 126 U/L Final   Total Bilirubin 12/08/2022 0.6  0.3  - 1.2 mg/dL Final   GFR, Estimated 12/08/2022 >60  >60 mL/min Final   Comment: (NOTE) Calculated using the CKD-EPI Creatinine Equation (2021)    Anion gap 12/08/2022 8  5 - 15 Final   Performed at [facility], [address], [place], [place] [address]   Alcohol, Ethyl (B) 12/08/2022 <10  <10 mg/dL Final   Comment: (NOTE) Lowest detectable limit for serum alcohol is 10 mg/dL.  For medical purposes only. Performed at [facility], [address] [place], [place] [address]    Glucose-Capillary 12/07/2022 98  70 - 99 mg/dL Final   Glucose reference range applies only to samples taken after fasting for at least 8 hours.  Admission on 12/05/2022, Discharged on 12/06/2022  Component Date Value Ref Range Status   Sodium 12/05/2022 140  135 -  145 mmol/L Final   Potassium 12/05/2022 3.1 (L)  3.5 - 5.1 mmol/L Final   Chloride 12/05/2022 105  98 - 111 mmol/L Final   CO2 12/05/2022 23  22 - 32 mmol/L Final   Glucose, Bld 12/05/2022 103 (H)  70 - 99 mg/dL Final   Glucose reference range applies only to samples taken after fasting for at least 8 hours.   BUN 12/05/2022 15  6 - 20 mg/dL Final   Creatinine, Ser 12/05/2022 0.82  0.44 - 1.00 mg/dL Final   Calcium 16/03/9603 8.9  8.9 - 10.3 mg/dL Final   Total Protein 54/02/8118 7.1  6.5 - 8.1 g/dL Final   Albumin 14/78/2956 3.4 (L)  3.5 - 5.0 g/dL Final   AST 21/30/8657 15  15 - 41 U/L Final   ALT 12/05/2022 16  0 - 44 U/L Final   Alkaline Phosphatase 12/05/2022 63  38 - 126 U/L Final   Total Bilirubin 12/05/2022 0.5  0.3 - 1.2 mg/dL Final   GFR, Estimated 12/05/2022 >60  >60 mL/min Final   Comment: (NOTE) Calculated using the CKD-EPI Creatinine Equation (2021)    Anion gap 12/05/2022 12  5 - 15 Final   Performed at [facility], [address], [place], [place] [address]   Alcohol, Ethyl (B) 12/05/2022 <10  <10 mg/dL Final   Comment: (NOTE) Lowest detectable limit for serum alcohol is 10  mg/dL.  For medical purposes only. Performed at [facility], [address] [place], [place] [address]    Acetaminophen (Tylenol), Serum 12/05/2022 <10 (L)  10 - 30 ug/mL Final   Comment: (NOTE) Therapeutic concentrations vary significantly. A range of 10-30 ug/mL  may be an effective concentration for many patients. However, some  are best treated at concentrations outside of this range. Acetaminophen concentrations >150 ug/mL at 4 hours after ingestion  and >50 ug/mL at 12 hours after ingestion are often associated with  toxic reactions.  Performed at [facility], [address] [place], [place] [address]    Troponin I (High Sensitivity) 12/05/2022 4  <18 ng/L Final   Comment: (NOTE) Elevated high sensitivity troponin I (hsTnI) values and significant  changes across serial measurements may suggest ACS but many other  chronic and acute conditions are known to elevate hsTnI results.  Refer to the "Links" section for chest pain algorithms and additional  guidance. Performed at [facility], [address] [place], [place] [address]    WBC 12/05/2022 5.5  4.0 - 10.5 K/uL Final   RBC 12/05/2022 3.86 (L)  3.87 - 5.11 MIL/uL Final   Hemoglobin 12/05/2022 8.5 (L)  12.0 - 15.0 g/dL Final   Comment: Reticulocyte Hemoglobin testing may be clinically indicated, consider ordering this additional test [id number]    HCT 12/05/2022 29.5 (L)  36.0 - 46.0 % Final   MCV 12/05/2022 76.4 (L)  80.0 - 100.0 fL Final   MCH 12/05/2022 22.0 (L)  26.0 - 34.0 pg Final   MCHC 12/05/2022 28.8 (L)  30.0 - 36.0 g/dL Final   RDW 03/47/4259 20.9 (H)  11.5 - 15.5 % Final   Platelets 12/05/2022 319  150 - 400 K/uL Final   nRBC 12/05/2022 0.0  0.0 - 0.2 % Final   Neutrophils Relative % 12/05/2022 69  % Final   Neutro Abs 12/05/2022 3.8  1.7 - 7.7 K/uL Final   Lymphocytes Relative 12/05/2022 20  % Final   Lymphs Abs 12/05/2022 1.1  0.7 -  4.0 K/uL Final   Monocytes Relative 12/05/2022  8  % Final   Monocytes Absolute 12/05/2022 0.4  0.1 - 1.0 K/uL Final   Eosinophils Relative 12/05/2022 2  % Final   Eosinophils Absolute 12/05/2022 0.1  0.0 - 0.5 K/uL Final   Basophils Relative 12/05/2022 1  % Final   Basophils Absolute 12/05/2022 0.1  0.0 - 0.1 K/uL Final   Immature Granulocytes 12/05/2022 0  % Final   Abs Immature Granulocytes 12/05/2022 0.01  0.00 - 0.07 K/uL Final   Performed at [facility], [address], [place], [place] [address]   Salicylate Lvl 12/05/2022 <7.0 (L)  7.0 - 30.0 mg/dL Final   Performed at [facility], [address] [place], [place] [address]   Troponin I (High Sensitivity) 12/06/2022 4  <18 ng/L Final   Comment: (NOTE) Elevated high sensitivity troponin I (hsTnI) values and significant  changes across serial measurements may suggest ACS but many other  chronic and acute conditions are known to elevate hsTnI results.  Refer to the "Links" section for chest pain algorithms and additional  guidance. Performed at [facility], [address], [place], [place] [address]   Admission on 11/20/2022, Discharged on 11/24/2022  Component Date Value Ref Range Status   Sodium 11/20/2022 144  135 - 145 mmol/L Final   Potassium 11/20/2022 3.0 (L)  3.5 - 5.1 mmol/L Final   Chloride 11/20/2022 108  98 - 111 mmol/L Final   CO2 11/20/2022 26  22 - 32 mmol/L Final   Glucose, Bld 11/20/2022 103 (H)  70 - 99 mg/dL Final   Glucose reference range applies only to samples taken after fasting for at least 8 hours.   BUN 11/20/2022 16  6 - 20 mg/dL Final   Creatinine, Ser 11/20/2022 0.72  0.44 - 1.00 mg/dL Final   Calcium 91/47/8295 9.0  8.9 - 10.3 mg/dL Final   GFR, Estimated 11/20/2022 >60  >60 mL/min Final   Comment: (NOTE) Calculated using the CKD-EPI Creatinine Equation (2021)    Anion gap 11/20/2022 10  5 - 15 Final   Performed at [facility], [address] [place], [place] [address]   WBC 11/20/2022 4.7  4.0 - 10.5 K/uL Final    RBC 11/20/2022 3.98  3.87 - 5.11 MIL/uL Final   Hemoglobin 11/20/2022 8.5 (L)  12.0 - 15.0 g/dL Final   Comment: Reticulocyte Hemoglobin testing may be clinically indicated, consider ordering this additional test [id number]    HCT 11/20/2022 29.5 (L)  36.0 - 46.0 % Final   MCV 11/20/2022 74.1 (L)  80.0 - 100.0 fL Final   MCH 11/20/2022 21.4 (L)  26.0 - 34.0 pg Final   MCHC 11/20/2022 28.8 (L)  30.0 - 36.0 g/dL Final   RDW 62/95/2841 18.3 (H)  11.5 - 15.5 % Final   Platelets 11/20/2022 302  150 - 400 K/uL Final   nRBC 11/20/2022 0.0  0.0 - 0.2 % Final   Performed at [facility], [address], [place], [place] [address]   Color, Urine 11/20/2022 YELLOW  YELLOW Final   APPearance 11/20/2022 CLOUDY (A)  CLEAR Final   Specific Gravity, Urine 11/20/2022 1.020  1.005 - 1.030 Final   pH 11/20/2022 6.0  5.0 - 8.0 Final   Glucose, UA 11/20/2022 NEGATIVE  NEGATIVE mg/dL Final   Hgb urine dipstick 11/20/2022 NEGATIVE  NEGATIVE Final   Bilirubin Urine 11/20/2022 NEGATIVE  NEGATIVE Final   Ketones, ur  11/20/2022 NEGATIVE  NEGATIVE mg/dL Final   Protein, ur 40/98/1191 NEGATIVE  NEGATIVE mg/dL Final   Nitrite 47/82/9562 NEGATIVE  NEGATIVE Final   Leukocytes,Ua 11/20/2022 NEGATIVE  NEGATIVE Final   Performed at [facility], [address], [place], [place] [address]   Glucose-Capillary 11/24/2022 431 (H)  70 - 99 mg/dL Final   Glucose reference range applies only to samples taken after fasting for at least 8 hours.   I-stat hCG, quantitative 11/20/2022 <5.0  <5 mIU/mL Final   Comment 3 11/20/2022          Final   Comment:   GEST. AGE      CONC.  (mIU/mL)   <=1 WEEK        5 - 50     2 WEEKS       50 - 500     3 WEEKS       100 - 10,000     4 WEEKS     1,000 - 30,000        FEMALE AND NON-PREGNANT FEMALE:     LESS THAN 5 mIU/mL    Total Protein 11/20/2022 6.9  6.5 - 8.1 g/dL Final   Albumin 57/84/6962 3.2 (L)  3.5 - 5.0 g/dL Final   AST 95/28/4132 19  15 - 41 U/L Final   ALT  11/20/2022 18  0 - 44 U/L Final   Alkaline Phosphatase 11/20/2022 60  38 - 126 U/L Final   Total Bilirubin 11/20/2022 0.1 (L)  0.3 - 1.2 mg/dL Final   Bilirubin, Direct 11/20/2022 <0.1  0.0 - 0.2 mg/dL Final   Indirect Bilirubin 11/20/2022 NOT CALCULATED  0.3 - 0.9 mg/dL Final   Performed at [facility], [address], [place], [place] [address]   Troponin I (High Sensitivity) 11/20/2022 6  <18 ng/L Final   Comment: (NOTE) Elevated high sensitivity troponin I (hsTnI) values and significant  changes across serial measurements may suggest ACS but many other  chronic and acute conditions are known to elevate hsTnI results.  Refer to the "Links" section for chest pain algorithms and additional  guidance. Performed at [facility], [address] [place], [place] [address]    Magnesium 11/20/2022 2.0  1.7 - 2.4 mg/dL Final   Performed at [facility], [address] [place], [place] [address]   Troponin I (High Sensitivity) 11/20/2022 7  <18 ng/L Final   Comment: (NOTE) Elevated high sensitivity troponin I (hsTnI) values and significant  changes across serial measurements may suggest ACS but many other  chronic and acute conditions are known to elevate hsTnI results.  Refer to the "Links" section for chest pain algorithms and additional  guidance. Performed at [facility], [address], [place], [place] [address]    Opiates 11/21/2022 NONE DETECTED  NONE DETECTED Final   Cocaine 11/21/2022 POSITIVE (A)  NONE DETECTED Final   Benzodiazepines 11/21/2022 NONE DETECTED  NONE DETECTED Final   Amphetamines 11/21/2022 NONE DETECTED  NONE DETECTED Final   Tetrahydrocannabinol 11/21/2022 POSITIVE (A)  NONE DETECTED Final   Barbiturates 11/21/2022 NONE DETECTED  NONE DETECTED Final   Comment: (NOTE) DRUG SCREEN FOR MEDICAL PURPOSES ONLY.  IF CONFIRMATION IS NEEDED FOR ANY PURPOSE, NOTIFY LAB WITHIN 5 DAYS.  LOWEST DETECTABLE LIMITS FOR URINE DRUG SCREEN Drug  Class                     Cutoff (ng/mL) Amphetamine and  metabolites    1000 Barbiturate and metabolites    200 Benzodiazepine                 200 Opiates and metabolites        300 Cocaine and metabolites        300 THC                            50 Performed at [facility], [address] [place], [place] [address]    Hgb A1c MFr Bld 11/24/2022 6.4 (H)  4.8 - 5.6 % Final   Comment: (NOTE) Pre diabetes:          5.7%-6.4%  Diabetes:              >6.4%  Glycemic control for   <7.0% adults with diabetes    Mean Plasma Glucose 11/24/2022 136.98  mg/dL Final   Performed at [facility], [address], [place], [place] [address]   Glucose-Capillary 11/24/2022 185 (H)  70 - 99 mg/dL Final   Glucose reference range applies only to samples taken after fasting for at least 8 hours.   Sodium 11/24/2022 137  135 - 145 mmol/L Final   Potassium 11/24/2022 4.0  3.5 - 5.1 mmol/L Final   Chloride 11/24/2022 104  98 - 111 mmol/L Final   CO2 11/24/2022 23  22 - 32 mmol/L Final   Glucose, Bld 11/24/2022 98  70 - 99 mg/dL Final   Glucose reference range applies only to samples taken after fasting for at least 8 hours.   BUN 11/24/2022 14  6 - 20 mg/dL Final   Creatinine, Ser 11/24/2022 0.87  0.44 - 1.00 mg/dL Final   Calcium 27/25/3664 9.1  8.9 - 10.3 mg/dL Final   GFR, Estimated 11/24/2022 >60  >60 mL/min Final   Comment: (NOTE) Calculated using the CKD-EPI Creatinine Equation (2021)    Anion gap 11/24/2022 10  5 - 15 Final   Performed at [facility], [address], [place], [place] [address]   Glucose-Capillary 11/24/2022 134 (H)  70 - 99 mg/dL Final   Glucose reference range applies only to samples taken after fasting for at least 8 hours.  Admission on 10/23/2022, Discharged on 10/24/2022  Component Date Value Ref Range Status   Glucose-Capillary 10/23/2022 104 (H)  70 - 99 mg/dL Final   Glucose reference range applies only to samples taken after fasting for at  least 8 hours.   Sodium 10/23/2022 141  135 - 145 mmol/L Final   Potassium 10/23/2022 3.2 (L)  3.5 - 5.1 mmol/L Final   Chloride 10/23/2022 103  98 - 111 mmol/L Final   BUN 10/23/2022 15  6 - 20 mg/dL Final   Creatinine, Ser 10/23/2022 0.70  0.44 - 1.00 mg/dL Final   Glucose, Bld 42/59/5638 105 (H)  70 - 99 mg/dL Final   Glucose reference range applies only to samples taken after fasting for at least 8 hours.   Calcium, Ion 10/23/2022 1.22  1.15 - 1.40 mmol/L Final   TCO2 10/23/2022 27  22 - 32 mmol/L Final   Hemoglobin 10/23/2022 9.5 (L)  12.0 - 15.0 g/dL Final   HCT 75/64/3329 28.0 (L)  36.0 - 46.0 % Final   Alcohol, Ethyl (B) 10/23/2022 <10  <10 mg/dL Final   Comment: (NOTE) Lowest detectable limit for serum alcohol is 10 mg/dL.  For medical purposes only. Performed at  [facility], [address] [place], [place] [address]    Prothrombin Time 10/23/2022 14.2  11.4 - 15.2 seconds Final   INR 10/23/2022 1.1  0.8 - 1.2 Final   Comment: (NOTE) INR goal varies based on device and disease states. Performed at [facility], [address], [place], [place] [address]    aPTT 10/23/2022 28  24 - 36 seconds Final   Performed at [facility], [address], [place], [place] [address]   WBC 10/23/2022 4.3  4.0 - 10.5 K/uL Final   RBC 10/23/2022 3.77 (L)  3.87 - 5.11 MIL/uL Final   Hemoglobin 10/23/2022 8.3 (L)  12.0 - 15.0 g/dL Final   Comment: Reticulocyte Hemoglobin testing may be clinically indicated, consider ordering this additional test [id number]    HCT 10/23/2022 28.0 (L)  36.0 - 46.0 % Final   MCV 10/23/2022 74.3 (L)  80.0 - 100.0 fL Final   MCH 10/23/2022 22.0 (L)  26.0 - 34.0 pg Final   MCHC 10/23/2022 29.6 (L)  30.0 - 36.0 g/dL Final   RDW 06/18/7251 18.7 (H)  11.5 - 15.5 % Final   Platelets 10/23/2022 271  150 - 400 K/uL Final   nRBC 10/23/2022 0.0  0.0 - 0.2 % Final   Performed at [facility], [address] [place], [place] [address]    Neutrophils Relative % 10/23/2022 48  % Final   Neutro Abs 10/23/2022 2.1  1.7 - 7.7 K/uL Final   Lymphocytes Relative 10/23/2022 40  % Final   Lymphs Abs 10/23/2022 1.7  0.7 - 4.0 K/uL Final   Monocytes Relative 10/23/2022 8  % Final   Monocytes Absolute 10/23/2022 0.4  0.1 - 1.0 K/uL Final   Eosinophils Relative 10/23/2022 3  % Final   Eosinophils Absolute 10/23/2022 0.1  0.0 - 0.5 K/uL Final   Basophils Relative 10/23/2022 1  % Final   Basophils Absolute 10/23/2022 0.0  0.0 - 0.1 K/uL Final   Immature Granulocytes 10/23/2022 0  % Final   Abs Immature Granulocytes 10/23/2022 0.00  0.00 - 0.07 K/uL Final   Performed at [facility], [address] [place], [place] [address]   Sodium 10/23/2022 141  135 - 145 mmol/L Final   Potassium 10/23/2022 3.2 (L)  3.5 - 5.1 mmol/L Final   Chloride 10/23/2022 104  98 - 111 mmol/L Final   CO2 10/23/2022 27  22 - 32 mmol/L Final   Glucose, Bld 10/23/2022 110 (H)  70 - 99 mg/dL Final   Glucose reference range applies only to samples taken after fasting for at least 8 hours.   BUN 10/23/2022 15  6 - 20 mg/dL Final   Creatinine, Ser 10/23/2022 0.69  0.44 - 1.00 mg/dL Final   Calcium 59/56/3875 9.1  8.9 - 10.3 mg/dL Final   Total Protein 64/33/2951 6.3 (L)  6.5 - 8.1 g/dL Final   Albumin 88/41/6606 3.0 (L)  3.5 - 5.0 g/dL Final   AST 30/16/0109 16  15 - 41 U/L Final   ALT 10/23/2022 12  0 - 44 U/L Final   Alkaline Phosphatase 10/23/2022 58  38 - 126 U/L Final   Total Bilirubin 10/23/2022 0.1 (L)  0.3 - 1.2 mg/dL Final   GFR, Estimated 10/23/2022 >60  >60 mL/min Final   Comment: (NOTE) Calculated using the CKD-EPI Creatinine Equation (2021)    Anion gap 10/23/2022 10  5 - 15 Final   Performed at [facility], [address] [place], [place] [address]   I-stat hCG, quantitative 10/23/2022 <5.0  <5 mIU/mL Final   Comment 3 10/23/2022          Final   Comment:   GEST. AGE      CONC.  (mIU/mL)   <=1 WEEK        5 - 50     2 WEEKS        50 - 500     3 WEEKS       100 - 10,000     4 WEEKS     1,000 - 30,000        FEMALE AND NON-PREGNANT FEMALE:     LESS THAN 5 mIU/mL   There may be more visits with results that are not included.    Allergies: Tomato, Beef-derived products, Hydrocodone, and Latex  Medications:  Facility Ordered Medications  Medication   acetaminophen (TYLENOL) tablet 650 mg   alum & mag hydroxide-simeth (MAALOX/MYLANTA) 200-200-20 MG/5ML suspension 30 mL   magnesium hydroxide (MILK OF MAGNESIA) suspension 30 mL   hydrOXYzine (ATARAX) tablet 25 mg   traZODone (DESYREL) tablet 50 mg   [COMPLETED] ARIPiprazole (ABILIFY) tablet 10 mg   ARIPiprazole ER (ABILIFY MAINTENA) 400 MG prefilled syringe 400 mg   aspirin EC tablet 81 mg   metFORMIN (GLUCOPHAGE) tablet 500 mg   rosuvastatin (CRESTOR) tablet 40 mg   amLODipine (NORVASC) tablet 10 mg   ferrous sulfate tablet 325 mg   potassium chloride SA (KLOR-CON M) CR tablet 20 mEq   nicotine polacrilex (NICORETTE) gum 2 mg   pantoprazole (PROTONIX) EC tablet 40 mg   losartan (COZAAR) tablet 100 mg   PTA Medications  Medication Sig   amLODipine (NORVASC) 10 MG tablet Take 1 tablet (10 mg total) by mouth daily.   losartan (COZAAR) 100 MG tablet Take 1 tablet (100 mg total) by mouth daily.   rosuvastatin (CRESTOR) 40 MG tablet Take 1 tablet (40 mg total) by mouth daily.   metFORMIN (GLUCOPHAGE) 500 MG tablet Take 1 tablet (500 mg total) by mouth 2 (two) times daily with a meal.   pantoprazole (PROTONIX) 40 MG tablet Take 1 tablet (40 mg total) by mouth daily. (Patient taking differently: Take 40 mg by mouth daily as needed (Indigestion).)   ferrous sulfate 325 (65 FE) MG tablet Take 1 tablet (325 mg total) by mouth every other day. (Patient not taking: Reported on 12/14/2022)   aspirin EC 81 MG tablet Take 81 mg by mouth daily. Swallow whole.   gabapentin (NEURONTIN) 100 MG capsule Take 1 capsule (100 mg total) by mouth 3 (three) times daily for pain scale 4-7.  (Patient not taking: Reported on 12/14/2022)   nicotine polacrilex (NICORETTE) 2 MG gum Take 1 each (2 mg total) by mouth every 2 (two) hours as needed for Smoking Cravings (Patient not taking: Reported on 12/14/2022)   potassium chloride SA (KLOR-CON M) 20 MEQ tablet Take 1 tablet (20 mEq total) by mouth 2 (two) times daily.      Medical Decision Making  Kaitlyn Gray was admitted to [facility] continuous assessment unit for Bipolar affective disorder, currently depressed, moderate (HCC), restart long acting injectable (Abilify Maintena), crisis management, and stabilization. Routine labs ordered, which include Lab Orders         CBC with Differential/Platelet         Comprehensive metabolic panel         Hemoglobin A1c  Magnesium         Ethanol         Prolactin         Urinalysis, Routine w reflex microscopic -Urine, Clean Catch         Lipid panel         TSH         POC urine preg, ED         POCT Urine Drug Screen - (I-Screen)    Medication Management: Medications started Meds ordered this encounter  Medications   acetaminophen (TYLENOL) tablet 650 mg   alum & mag hydroxide-simeth (MAALOX/MYLANTA) 200-200-20 MG/5ML suspension 30 mL   magnesium hydroxide (MILK OF MAGNESIA) suspension 30 mL   hydrOXYzine (ATARAX) tablet 25 mg   traZODone (DESYREL) tablet 50 mg   ARIPiprazole (ABILIFY) tablet 10 mg   ARIPiprazole ER (ABILIFY MAINTENA) 400 MG prefilled syringe 400 mg   aspirin EC tablet 81 mg   metFORMIN (GLUCOPHAGE) tablet 500 mg   rosuvastatin (CRESTOR) tablet 40 mg   amLODipine (NORVASC) tablet 10 mg   ferrous sulfate tablet 325 mg   potassium chloride SA (KLOR-CON M) CR tablet 20 mEq   nicotine polacrilex (NICORETTE) gum 2 mg   pantoprazole (PROTONIX) EC tablet 40 mg   losartan (COZAAR) tablet 100 mg    Will maintain continuous observation for safety. Social work will consult with patient to establish Kaitlyn Gray or ACT services, and to  help with understanding of Medicaid transport also discussing discharge and follow up plan.    Recommendations  Based on my evaluation the patient does not appear to have an emergency medical condition.  Ercel Pepitone, NP 12/23/22  4:26 PM

## 2022-12-23 NOTE — Discharge Instructions (Addendum)
Based on the information that you have provided and the presenting issues outpatient services and resources for have been recommended.  It is imperative that you follow through with treatment recommendations within 5-7 days from the of discharge to mitigate further risk to your safety and mental well-being. A list of referrals has been provided below to get you started.  You are not limited to the list provided.  In case of an urgent crisis, you may contact the Mobile Crisis Unit with [facility] at [phone].  [facility]        Non-Emergency Transportation      FOR NON_MEDICAID RECEIPTANTS AND PERSON 31 YEARS OLD OR YOUNGER ONLY. ALL OTHERS MUST CALL [phone]. Non-Emergency Medical Transportation   [email or web address]   [facility] [facility]  [address]   [place] [address]  Phone [phone] Fax [phone]      [facility]  [address] [place], [place], [place]  [phone]  Serving?[place],?[place]         [email or web address]  [phone]  Serving [facility]  [address] [place], [place] [address]  [phone]       .Marland KitchenOBS Care Management   For your behavioral health needs, you are advised to follow up with an outpatient provider.  You may be eligible for ACT Team services, which would include more frequent visits with your provider, as well as in-home services.  The following providers offer ACT Team services.  Contact them at your earliest opportunity to ask about enrolling in their program:       [facility]      [address]      [place], [place] [address]      [phone]      [place],  [place] [address]      [phone]       [facility]      [address]      [place], [place] [address]      [phone]       [facility] ACT Team      [facility] [place] [facility], [address]      [address]      [place], [place]  [address]      [phone]       [facility]      [address]      [place], [place] [address]      [phone]   Writer completed an referral form for ACTT services with [facility].  The ACTT Team will follow up with the patient to determine if she qualifies for ACTT services.

## 2022-12-23 NOTE — ED Notes (Signed)
Pharmacy states  to give po Abilify then wait  and hour and give the injection if she does not have an reaction.

## 2022-12-23 NOTE — Progress Notes (Signed)
Psychiatric Initial Adult Assessment  Virtual Visit via Video Note  I connected with Kaitlyn Gray on 12/23/22 at  1:00 PM EDT by a video enabled telemedicine application and verified that I am speaking with the correct person using two identifiers.  Location: Patient: Home Provider: Clinic   I discussed the limitations of evaluation and management by telemedicine and the availability of in person appointments. The patient expressed understanding and agreed to proceed.  I provided 45 minutes of non-face-to-face time during this encounter.   Patient Identification: Kaitlyn Gray MRN:  [id number] Date of Evaluation:  12/23/2022 Referral Source: [facility] Chief Complaint:  "I bang my head because the voices tell me to kill myself" breathing Visit Diagnosis:    ICD-10-CM   1. Bipolar affective disorder, currently depressed, moderate (HCC)  F31.32     2. Polysubstance abuse (HCC)  F19.10       History of Present Illness: 47 year old female seen today for initial psychiatric evaluation.  She was referred to outpatient psychiatry by [facility] where she presented on 01/12/2023 for suicidal ideation.  She has a psychiatric history of bipolar affective disorder, polysubstance use (marijuana and cocaine).  Patient informed Clinical research associate that she has been taking old prescription of Seroquel.  Patient was last prescribed Seroquel 200 mg nightly.  She reports that it is somewhat effective in managing her psychiatric condition.  Today patient is fairly groomed, pleasant, and cooperative.  Patient tearful throughout the exam.  She informed writer that she bangs her head because the voices tell her to kill herself.  Patient reports that she sees people in the wall who called her ugly, tell her to lay in traffic, and tell her to harm herself.  Patient notes that these voices become worse when she is alone in her home.  Patient currently has an apartment of her own.  She is single.  She has 4 children but notes  that her 2 youngest are in foster care and her older children are not actively in her life.  Today patient alert and oriented to self.  Patient unable to accurately detail her birthday, but was unable to correctly state her current location, the date, or the current president.  Patient did have 3 strokes in the past that may be limiting her executive functions.  Patient unable to do a GAD-7 or PHQ-9 because at times she becomes confused and forgetful.  She does Archivist that she is anxious and depressed.  She notes that most days she cries and finds it difficult to complete tasks that she wants enjoys.  Patient walks utilizing a walker.  Patient notes that with the above stressors she uses cocaine and marijuana.  Provider informed patient that the substances can exacerbate her mental health.  She endorsed understanding but reports that they help.  She also informed Clinical research associate that she smokes cigarettes occasionally when she cannot afford them.  Patient informed Clinical research associate that other stressors are food insecurity and financial instability.  She notes that she has a payee but at times feels that she holds her money.  She requested provider write a letter detailing that she is able to be her own payee.  Provider informed patient that this cannot be done and it would have to be addressed in court.  Patient informed Clinical research associate that she does not feel safe going home.  She notes that if she goes home she probably harm herself.  She does note that she takes additional Seroquel and reports that she will continue to  take her Seroquel until she no longer wakes up.  Patient has abnormal muscle movements.  Provider conducted an aims assessment and patient scored a 10.  Patient notes that her sleep is poor reporting that with taking 2 Seroquel nightly she is only sleeping 1 to 2 hours nightly.  She endorses having adequate appetite.  Provider discussed patient with Dr. Antony Salmon.  After consultation it was determined that it  would be safer to have patient seen in observation.  Dr. Antony Salmon and writer walked patient down and collaborated with [employer].  Provider spoke with [facility] staff suggesting Abilify 10 mg to be initiated, patient be observed overnight, and Abilify Maintena 400 mg restarted if oral pill as tolerated. No other concerns noted at this time Associated Signs/Symptoms: Depression Symptoms:  depressed mood, anhedonia, insomnia, psychomotor agitation, fatigue, feelings of worthlessness/guilt, difficulty concentrating, impaired memory, suicidal thoughts with specific plan, anxiety, disturbed sleep, increased appetite, (Hypo) Manic Symptoms:  Distractibility, Elevated Mood, Flight of Ideas, Hallucinations, Irritable Mood, Anxiety Symptoms:  Excessive Worry, Psychotic Symptoms:  Hallucinations: Auditory Command:  Tells to lay in traffic and bang her head Visual Paranoia, PTSD Symptoms: NA  Past Psychiatric History: Adjustment disorder, bipolar disorder, polysubstance use (tobacco, opioid, cocaine, marijuana), anxiety, depression  Previous Psychotropic Medications:  Seroquel, Cogentin, Haldol (ineffective), Risperdal, Trazodone (ineffective) Paxil, gabapentin  Substance Abuse History in the last 12 months:  Yes.    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) 07/19/2017   Hypertension     Hypertensive emergency 05/31/2022   Ischemic stroke (HCC) 01/01/2020   subacute right middle cerebellar peduncle and pons infarction   Left-sided weakness 01/27/2022   Head CT with remote right occipital infarct which is consistent with old left-sided weakness   MDD (major depressive disorder), recurrent severe, 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   Normocytic anemia 02/09/2020   Opiate use    History of Suboxone Therapy until 05/2021   Polysubstance abuse (HCC) 07/19/2017   Prescription drug misadventures (Seroquel)     Past Surgical History:  Procedure Laterality Date   CESAREAN SECTION      Family Psychiatric History: Unknown  Family History:  Family History  Problem Relation Age of Onset   Hypertension Mother    CAD Mother 66       died of MI at age 46   Hypertension Father     Social History:   Social History   Socioeconomic History   Marital status: Single    Spouse name: Not on file   Number of children: Not on file   Years of education: Not on file   Highest education level: Not on file  Occupational History   Not on file  Tobacco Use   Smoking status: Some Days    Packs/day: .5    Types: Cigarettes    Passive exposure: Current   Smokeless tobacco: Former  Building services engineer  Use: Never used  Substance and Sexual Activity   Alcohol use: No   Drug use: Yes    Types: Marijuana, Cocaine   Sexual activity: Not on file  Other Topics Concern   Not on file  Social History Narrative   ** Merged History Encounter **       Social Determinants of Health   Financial Resource Strain: Not on file  Food Insecurity: Food Insecurity Present (08/04/2022)   Hunger Vital Sign    Worried About Running Out of Food in the Last Year: Sometimes true    Ran Out of Food in the Last Year: Sometimes true  Transportation Needs: Unmet Transportation Needs (08/04/2022)   PRAPARE - Administrator, Civil Service (Medical): Yes    Lack of Transportation  (Non-Medical): Yes  Physical Activity: Not on file  Stress: Not on file  Social Connections: Not on file    Additional Social History: Patient resides in [place].  She is single.  She has 4 children but notes that her 2 youngest are in foster care and her older children are not actively in her life.  He says that she reports using marijuana, cocaine, and smoking tobacco.  She denies alcohol use  Allergies:   Allergies  Allergen Reactions   Tomato Anaphylaxis    Pt reports this as an allergy, but has been eating ketchup and pizza on previous visits w/o any s/s of allergic reaction/anaphylaxis.    Beef-Derived Products     Pt does not eat red meat   Hydrocodone Itching   Latex Itching and Rash    Metabolic Disorder Labs: Lab Results  Component Value Date   HGBA1C 6.4 (H) 11/24/2022   MPG 136.98 11/24/2022   MPG 142.72 03/30/2022   No results found for: "PROLACTIN" Lab Results  Component Value Date   CHOL 151 04/01/2022   TRIG 151 (H) 04/01/2022   HDL 44 04/01/2022   CHOLHDL 3.4 04/01/2022   VLDL 30 04/01/2022   LDLCALC 77 04/01/2022   LDLCALC 102 (H) 09/07/2020   Lab Results  Component Value Date   TSH 0.955 08/04/2022    Therapeutic Level Labs: No results found for: "LITHIUM" No results found for: "CBMZ" Lab Results  Component Value Date   VALPROATE <10 (L) 07/19/2017    Current Medications: Current Outpatient Medications  Medication Sig Dispense Refill   amLODipine (NORVASC) 10 MG tablet Take 1 tablet (10 mg total) by mouth daily. 30 tablet 2   aspirin EC 81 MG tablet Take 81 mg by mouth daily. Swallow whole.     ferrous sulfate 325 (65 FE) MG tablet Take 1 tablet (325 mg total) by mouth every other day. (Patient not taking: Reported on 12/14/2022) 15 tablet 2   gabapentin (NEURONTIN) 100 MG capsule Take 1 capsule (100 mg total) by mouth 3 (three) times daily for pain scale 4-7. (Patient not taking: Reported on 12/14/2022) 45 capsule 1   gabapentin  (NEURONTIN) 300 MG capsule Take 1 capsule (300 mg total) by mouth at bedtime. 30 capsule 2   losartan (COZAAR) 100 MG tablet Take 1 tablet (100 mg total) by mouth daily. 30 tablet 2   metFORMIN (GLUCOPHAGE) 500 MG tablet Take 1 tablet (500 mg total) by mouth 2 (two) times daily with a meal. 60 tablet 2   nicotine polacrilex (NICORETTE) 2 MG gum Take 1 each (2 mg total) by mouth every 2 (two) hours as needed for Smoking Cravings (Patient not taking: Reported on 12/14/2022) 180 each  1   pantoprazole (PROTONIX) 40 MG tablet Take 1 tablet (40 mg total) by mouth daily. (Patient taking differently: Take 40 mg by mouth daily as needed (Indigestion).) 30 tablet 2   PARoxetine (PAXIL) 20 MG tablet Take 1 tablet (20 mg total) by mouth daily for depression. 15 tablet 0   potassium chloride SA (KLOR-CON M) 20 MEQ tablet Take 1 tablet (20 mEq total) by mouth 2 (two) times daily. 6 tablet 0   QUEtiapine (SEROQUEL) 200 MG tablet Take 1 tablet (200 mg total) by mouth at bedtime. 15 tablet 0   rosuvastatin (CRESTOR) 40 MG tablet Take 1 tablet (40 mg total) by mouth daily. 30 tablet 2   No current facility-administered medications for this visit.    Musculoskeletal: Strength & Muscle Tone: decreased and Walker Gait & Station: unsteady Patient leans: N/A  Psychiatric Specialty Exam: Review of Systems  There were no vitals taken for this visit.There is no height or weight on file to calculate BMI.  General Appearance: Well Groomed  Eye Contact:  Fair  Speech:  Slurred  Volume:  Decreased  Mood:  Anxious, Depressed, and Irritable  Affect:  Appropriate and Congruent  Thought Process:  Coherent  Orientation:  Other:  oriented to self  Thought Content:  Logical and Hallucinations: Auditory Visual  Suicidal Thoughts:  Yes.  with intent/plan  Homicidal Thoughts:  No  Memory:  Immediate;   Fair Recent;   Fair Remote;   Fair  Judgement:  Fair  Insight:  Fair  Psychomotor Activity:  Decreased and TD   Concentration:  Concentration: Good and Attention Span: Good  Recall:  Good  Fund of Knowledge:Good  Language: Fair  Akathisia:  No  Handed:  Right  AIMS (if indicated):  done, 10  Assets:  Communication Skills Desire for Improvement Financial Resources/Insurance Housing Intimacy Physical Health Social Support  ADL's:  Intact  Cognition: WNL  Sleep:  Poor   Screenings: AIMS    Flowsheet Row [facility]  AIMS Total Score 0      AUDIT    Flowsheet Row [facility]) [facility]  Alcohol Use Disorder Identification Test Final Score (AUDIT) 0 0      CAGE-AID    Flowsheet Row ED from 10/16/2021 in [facility] Department at [facility] ED to Hosp-Admission (Discharged) from 09/06/2020 in [facility] ED to Hosp-Admission (Discharged) from 02/08/2020 in [facility] to Hosp-Admission (Discharged) from 01/01/2020 in [facility]  CAGE-AID Score 1 1 0 1      GAD-7    Flowsheet Row Office Visit from 07/17/2022 in [facility] at [facility]  Total GAD-7 Score 7      PHQ2-9    Flowsheet Row Office Visit from 07/17/2022 in [facility] ED from 12/12/2021 in [facility] Emergency Department at [facility] Congregational Nurse Program from 07/15/2017 in [place] [facility] [place]  PHQ-2 Total Score 2 2 4   PHQ-9 Total Score 21 12 10       Flowsheet Row ED from 12/22/2022 in [facility] Department at [facility] Most recent reading at 12/22/2022  4:07 PM ED from 12/22/2022 in [facility] Most recent reading at 12/22/2022  2:36 PM ED from 12/21/2022  in [facility] Department  at [facility] Most recent reading at 12/22/2022  5:21 AM  C-SSRS RISK CATEGORY High Risk Moderate Risk Moderate Risk       Assessment and Plan: Patient endorses insominia, anxiety, depression, and symptoms of TD.Patient informed Clinical research associate that she does not feel safe going home.  She notes that if she goes home she probably harm herself.  She does note that she takes additional Seroquel and reports that she will continue to take her Seroquel until she no longer wakes up.  Patient has abnormal muscle movements.  Provider conducted an aims assessment and patient scored a 10.  Patient notes that her sleep is poor reporting that with taking 2 Seroquel nightly she is only sleeping 1 to 2 hours nightly.  She endorses having adequate appetite.  Provider discussed patient with Dr. Antony Salmon.  After consultation it was determined that it would be safer to have patient seen in observation.  Dr. Antony Salmon and writer walked patient down and collaborated with [employer].  Patient would like to restart Abilify injection.  Provider spoke with [facility] staff suggesting Abilify 10 mg to be initiated, patient be observed overnight, and Abilify Maintena 400 mg restarted if oral pill as tolerated.  No other concerns noted at this time.  1. Bipolar affective disorder, currently depressed, moderate (HCC)   2. Polysubstance abuse (HCC)    Collaboration of Care: Other provider involved in patient's care AEB PCP  Patient/Guardian was advised Release of Information must be obtained prior to any record release in order to collaborate their care with an outside provider. Patient/Guardian was advised if they have not already done so to contact the registration department to sign all necessary forms in order for [place] to release information regarding their care.   Consent: Patient/Guardian gives verbal consent for treatment and assignment of benefits for services provided during this visit. Patient/Guardian expressed  understanding and agreed to proceed.   Shanna Cisco, NP 7/9/20242:08 PM

## 2022-12-23 NOTE — ED Notes (Signed)
Patient could not void encourage to drink fluids. UDS urine preg,and ua was not done.

## 2022-12-23 NOTE — ED Notes (Signed)
Progress note   D: Pt seen on unit. Pt denies SI, HI, AVH. Pt rates pain  10/10 as discomfort in her left buttock. Refused pain intervention. Pt rates anxiety  0/10 and depression  0/10. Pt is A&O x3. Pt is not oriented to time. Believes Fay Records is president and it is January of an unknown year. Pt asking for Seroquel. "They bring it to my house every week. What am I going to do about sleep?" Pt informed that this medication is not on her PTA med list. Pt also uses a rollating walker at home. Here pt is using a regular walker with some issue. Pt educated to slow down to avoid falling."They should give me my walker. I don't like this one." Pt informed that her type of walker is not allowed on the unit. Pt watched and is walking slower and better with the walker provided. No other concerns noted at this time.  A: Pt provided support and encouragement. Pt given scheduled medication as prescribed. PRNs as appropriate.   R: Pt safe on the unit. Will continue to monitor.

## 2022-12-23 NOTE — ED Notes (Signed)
Pt c/o inability to sleep and indigestion. Pt states that she takes seroquel but this medication is not on her PTA med list. Offered sleep medication that has been ordered for her. Pt accepted. PRNs given as appropriate.

## 2022-12-23 NOTE — ED Notes (Signed)
Patient  sleeping in no acute stress. RR even and unlabored .Environment secured .Will continue to monitor for safely. 

## 2022-12-23 NOTE — Progress Notes (Signed)
   12/23/22 1400  [facility] Triage Screening (Walk-ins at [facility] only)  How Did You Hear About [place]? Self  What Is the Reason for Your Visit/Call Today? Pt is a 47 yo female who presented at [facility] as planned on 12/22/22 for her OP visit. Similar presentation although pt seemed calmer today. Pt was seen twice yesterday (at [facility]) and discharged home. Pt reported passive SI as before. No HI, NSSH, AVH or paranoia reported. Pt denied any substance use currently or recently. Per NP, pt was seen upstairs as scheduled and medications was started. Per NP, Dr. Meda Klinefelter requested pt be admittd to Observation Unit overnight to guard against any side effects.  How Long Has This Been Causing You Problems? > than 6 months  Have You Recently Had Any Thoughts About Hurting Yourself? Yes  How long ago did you have thoughts about hurting yourself? passive SI  Are You Planning to Commit Suicide/Harm Yourself At This time? No  Have you Recently Had Thoughts About Hurting Someone [name]? No  Are You Planning To Harm Someone At This Time? No  Explanation: na  Are you currently experiencing any auditory, visual or other hallucinations? No (Not currently. Pt reported AH at times telling her to hurt herself.)  Please explain the hallucinations you are currently experiencing: AH yesterday reported. Chronic condition per pt.  Have You Used Any Alcohol or Drugs in the Past 24 Hours? No  What Did You Use and How Much? na  Do you have any current medical co-morbidities that require immediate attention? No (none reported)  Clinician description of patient physical appearance/behavior: casually dressed and improved grooming from 12/22/22. mumbling speech with coherent thoughts expressed. calm and cooperative.  What Do You Feel Would Help You the Most Today? Treatment for Depression or other mood problem  If access to [facility] was not available, would you have sought care in the Emergency Department? No  Determination  of Need Urgent (48 hours) (Per Shuvon Rankin NP pt is recommedned for OBS unit for overnight observation for medication start up.)  Options For Referral [facility] (OBS)

## 2022-12-23 NOTE — ED Notes (Signed)
All belongings given to pt. D/C papers reviewed w/ pt. Pt denies having any questions. Pt was told her plan of care is to go to [facility] to start up her outpatient treatment. Pt requesting a shower prior to D/C. Shower items given to pt.

## 2022-12-23 NOTE — Discharge Instructions (Signed)
Behavioral Health Resources:  Intensive Outpatient Programs: [facility]      [address] [place], [place] [phone] Both a day and evening program       [facility]     [address]        [place], [place] [address] [phone]         ADS: Alcohol & Drug Svcs [address] [place] [phone]  [facility] Mental Health ACCESS LINE: [phone] or [phone] [address] [place], [place] [address] [email or web address]   Substance Abuse Resources: [facility] Drug [facility]  [phone] [facility] [phone] [facility] [place] [phone] Floydene Flock [phone] Residential & Outpatient Substance Abuse Program  [phone]  Psychological Services: [facility]  [phone] [facility]  [phone] [facility], [phone] [place]. [address] [place], ACCESS LINE: [phone] or [phone], [email or web address]  Mobile Crisis Teams:                                        Therapeutic Alternatives         Mobile Crisis Care Unit [phone]             [facility] [address] Ginette Otto [phone]                                         Interventionist [address] [place] [phone]  Self-Help/Support Groups: Mental Health Assoc. of [employer] of support groups [phone] (call for more info)  Narcotics Anonymous (NA) C[facility] [address] [place] - 2 meetings at this location  Residential Treatment Programs:  [facility]      [address]        [place]       [phone]         [facility] [address], [place] [address] [place], [place]  [address] [phone]  [facility]  [address] [place], [place]  [address] [phone] Admissions: 8am-3pm M-F  [facility]     [address]        [place], [place] [address]       [phone]         [facility] [address] [place], [place] [phone]  [facility] [address] [place], [place] [phone]  [facility] - Intensive Outpatient      [address]     [place], [place]       [phone]         [facility] (Addiction Recovery [facility]     [address] [place], [place] [phone] or [phone]  Residential Treatment Services (RTS), Medicaid [address] [place], [place] [phone]  [facility] [address]                                               [address] [place] [phone]  [facility] Resources: Estate manager/land agent [phone]               General Therapy  Angie Fava, PhD        [address] [place], [place] [address]         [phone]   Insurance  [facility] Behavioral   [address] [place], [place] [address] [phone]  [facility] [address] [place], [place] [address] [phone] Insurance/Medicaid/sponsorship through [facility] Families                                              [address]                                        [place], [place] [address]    Therapy/tele-psych/case         [phone]          [facility] [address], [place]  [address]  Adolescent/group home/case management [phone]                                           Creola Corn PhD       General therapy       Insurance   [phone]         Dr. Lolly Mustache, Insurance, M-F [phone]  [facility]  [employer] [facility] Dept. [address]                 [address]         [address] [place] [address]  Blondell Reveal Phone:  [phone]                                  Phone:  [phone]                   Phone:  [phone]  [facility], [phone] [phone]       -     [facility] in [place], [address],             [phone], [facility] CENTERS [facility]) Monday - Friday 8am - 3pm          Sat & Sun 8am - 2pm [address] [place], [place] [address]   [phone]     www.i[email or web address] IRC offers among other critical resources: showers, laundry, barbershop, phone bank, mailroom, computer lab, medical  clinic, gardens and a bike maintenance area.   AREA SHELTERS  [place] [employer]  (Men & women) [address] [employer] [address] [place] [phone]  [facility] [place] (Men/women/families) [address] [place] [phone]   [facility] (Families with children) [phone] [address]  [place] [phone]   [facility] (Domestic Violence Shelter) [address]  [place] [phone]   [facility] (Children ages 40-17) [address]  [place] [phone]   [facility]   (Women & children) [address] [place] [phone]   [facility] (Women/substance abuse) [address]  [place] [phone]   [facility] (Men) [address]  [place] [phone]   [facility] (Men) [address]  [place] [phone]  [employer] (Women) [phone] [address]  [place] [phone]   [facility] (Single women & women with children) [address]  [place] [phone]  [facility] (Men/women/families) [address] [phone]    [facility]   (Domestic Violence shelter) [address]  [place]  [phone]   [facility] (Men & women) [address]  [place] [phone]  Angelina Pih Min (Men) [address]  [employer] [phone]   [facility] (Men) [address]  [place] [phone]   Holiday representative (Single women & families) [address]  [place] [phone]  Crisis Min. (Men/women & families) [address]  [place] [phone]    If you are at risk of losing your housing (throughout [facility]) call the Housing Hotline at (660)784-3595. You may also contact 2-1-1, a FREE service of the [place] [address] that provides information about many resources including housing. Dial 211, or visit online at PooledIncome.pl. [facility]:  [facility] [place], contact Janelle Floor [phone] (men only)                          Huey Romans in [place]:  90 day homeless program for women and men;                             contact Rev. Chambers [phone]  [facility]:  [facility]:  men/women/children [phone]  [facility]:  Shelter in [place], [place] [phone]                       [facility] in [place], [place] [phone]   [facility]:  Crisis Council for Abused Women, [phone]; (women and children)  [facility]:  [employer], men/women/children; [phone]                           [facility] in [place], [phone]; substance abuse halfway house for men              [facility]; 4 bedroom house in [facility] for homeless women, contact [facility] [phone]               [facility] in [place], [phone] (women and children)               Friend to Friend, for abused women and children, 24 hour crisis line, [phone], Jamison Oka  [facility], halfway house for women, [place], [phone]  [facility]:  [facility], [phone];  open Mon-Thurs from Jan14  - March 15 when temp is below 32 degrees                              [facility]; Alwyn Pea [place], [phone]; cell [phone]; open 24/7  [facility]:  [facility] - Rev Ladona Ridgel [phone]  [place]:  transitional housing for women and children; Arminda Resides [phone] Outpatient psychiatric Services  Walk in hours for medication management Monday, Wednesday, Thursday, and Friday from 8:00 AM to 11:00 AM Recommend arriving by by 7:30 AM.  It is first come first serve.    Walk in hours for therapy intake Monday and Wednesday only 8:00 AM to 11:00 AM Encouraged to arrive by 7:30 AM.  It is first come first serve   Inpatient patient psychiatric services The Facility Based Crisis Unit offers comprehensive behavioral heath care services for mental health and substance abuse treatment.  Social work can also assist with referral to or getting you into a rehabilitation program short or long term

## 2022-12-23 NOTE — Progress Notes (Signed)
   12/23/22 1435  [place] Suicide Severity Rating Scale  1. Wish to be Dead Yes  2. Suicidal Thoughts Yes  3. Suicidal Thoughts with Method Without Specific Plan or Intent to Act Yes  4. Suicidal Intent Without Specific Plan No  5. Suicide Intent with Specific Plan No  6. Suicide Behavior Question Yes  7. How long ago did you do any of these? Between three months and a year ago  C-SSRS RISK CATEGORY Moderate Risk  Patient location: [facility] Urgent Care/Facility Based Crisis Center  BH Urgent Care/Facility Based Crisis Center Suicide Precautions Interventions  BHUC/FBC Suicide Precautions Interventions Moderate Risk Interventions
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
## 2022-12-23 NOTE — ED Provider Notes (Signed)
Emergency Medicine Observation Re-evaluation Note  Kaitlyn Gray is a 47 y.o. female, seen on rounds today.  Pt initially presented to the ED for complaints of Suicidal and Hallucinations Currently, the patient is resting comfortably.  Physical Exam  BP (!) 151/70 (BP Location: Left Arm)   Pulse 66   Temp 98 F (36.7 C) (Oral)   Resp 20   LMP  (LMP Unknown)   SpO2 100%  Physical Exam General: NAD Cardiac: well perfused Lungs: even and unlabored Psych: no agitation  ED Course / MDM  EKG:   I have reviewed the labs performed to date as well as medications administered while in observation.  Recent changes in the last 24 hours include discharge planned for today as she was cleared by TTS.  Plan  Current plan is for discharge with outpatient resources.    Ernie Avena, MD 12/23/22 [phone]

## 2022-12-23 NOTE — ED Notes (Signed)
TTS in process 

## 2022-12-23 NOTE — ED Notes (Signed)
Pt resting at this hour. No apparent distress. RR even and unlabored. Monitored for safety.  

## 2022-12-23 NOTE — ED Notes (Signed)
Rn notified provider that patient is refusing the benadryl.

## 2022-12-23 NOTE — ED Notes (Signed)
Reviewed discharge instructions with patient. Follow-up care reviewed. Patient verbalized understanding. Patient A&Ox4, VSS, and ambulatory with steady gait using rollator upon discharge.  °

## 2022-12-24 ENCOUNTER — Encounter (HOSPITAL_COMMUNITY): Payer: Self-pay | Admitting: Registered Nurse

## 2022-12-24 LAB — PROLACTIN: Prolactin: 20.7 ng/mL (ref 4.8–33.4)

## 2022-12-24 NOTE — ED Notes (Signed)
Pt resting at this hour. No apparent distress. RR even and unlabored. Monitored for safety.  

## 2022-12-24 NOTE — ED Notes (Signed)
Pt refused VS assessment this morning. ?

## 2022-12-24 NOTE — ED Notes (Signed)
Patient Alert and oriented x 4. Yelling this morning demanding to go home. Patient refused her metformin saying I just want to go home. Will continue to monitor for safety

## 2022-12-24 NOTE — ED Provider Notes (Signed)
FBC/OBS ASAP Discharge Summary  Date and Time: 12/24/2022 10:20 AM  Name: Kaitlyn Gray  MRN:  [id number]   Discharge Diagnoses:  Final diagnoses:  Bipolar affective disorder, currently depressed, moderate (HCC)  Inability to access community resources due to transportation insecurity  Polysubstance abuse (HCC)    Subjective: "I'm ready to go home.  I want to leave"  Stay Summary: Kaitlyn Gray 47 yr. old female with a psychiatric history of bipolar affective disorder, polysubstance use (marijuana and cocaine) was admitted to continuous observation unit after presenting from her outpatient psychiatric appointment for medication management at [facility].  Outpatient wanted patient admitted to continuous observation to restart her Abilify Maintena since it had been 2 yrs. since her last dose.  Patient had also reported that she was having auditory/visual hallucinations and suicidal ideation with a plan to take an old prescription of Seroquel until she didn't wake.  Patient does have a history of frequent visits to the emergency room (ED) or urgent care (UC) with similar complaints.     Discharged from [facility] ED today and then brought to the [facility].  She has been in the ED or UC multiple times in the last several months most recent visits were June 21, 23, 24, 28, 29, July 3, 6, 7, 01/2023 and at time 2 visits on same day going from one hospital ED to the next or to [facility] after being kept overnight or at discharge for example:  July 3rd [facility] ED, 6th [facility] ED, 7th [facility], [facility], [facility]th [facility], [facility] ED She has a significant history of malingering as evident by multiple emergency room (ED), urgent care (UC) visits with similar complaints of auditory hallucination, passive suicidal ideation, or active suicidal ideation.  Patient has been observed overnight in the ED and UC and there has been no unsafe behavior observed.  Patients' similar complaint of suicidal ideation is often resolved once housing or other need has been  arranged for her.  However, resources and referrals have been made to assist patient that she has failed to follow up with.   Her primary reason for most frequent visits is that she is out of money as a result of her drug use.  During the times she was monitored in ED or urgent care she has demonstrates no unsafe behavior and there is no psychiatric condition which is amendable to inpatient psychiatric hospitalization.  Patients' suicidal ideation is conditional and used as a means of manipulation and/or attention seeking without true intention to harm herself.  For example, during admission she is constantly asking for food and snacks.  On initially admission she was given 2 sandwiches chips and 2 drinks, she then asked for more.  A total of 4 sandwiches several bags of chips and drinks were given.  When she was told that no more would be given, she became upset and asked if she could be discharged denying suicidal ideation and auditory/visual hallucinations.  She was told she had to stay related to being given her LAI and would be able to be discharged the next day.     Her behaviors are more consistent with someone who is acting in self-preservation, rather than someone who is acutely suicidal.  Given all that is stated above including the fact that the patient shows no signs of mania or psychosis, has a liner, coherent thought process throughout assessment the patient does not meet criteria for inpatient psychiatric hospitalization or further psychiatric evaluation at this time.  Patient would benefit from outpatient psychiatric services which resources will be provided along with community resources prior to discharge.    Patient seen face to face by this provider, chart reviewed, and consulted with Dr. Gretta Cool on 12/24/2022.  Patient reports that she is ready to go home.  She states that she is no longer hearing or seeing things, and she doesn't want to kill herself.  Patient states that she wanted to  be restarted on her Abilify Roderic Palau because she has trouble remembering to take her pills everyday.  She states she also has a problem remembering her appointments because of her short term memory and wanted to know if that was any way to get a reminder for her appointments.  Informed that she could be called and reminded of appointment. Today she denies suicidal/self-harm/homicidal ideation, psychosis, and paranoia.     Medical problems were identified and treated as needed.  Home medications were restarted, adjusted, or new medications added as needed or appropriate.  Medications treated with during admission are as follows.  amLODipine  10 mg Oral Daily   aspirin EC  81 mg Oral Daily   ferrous sulfate  325 mg Oral QODAY   losartan  100 mg Oral Daily   metFORMIN  500 mg Oral BID WC   pantoprazole  40 mg Oral Daily   potassium chloride SA  20 mEq Oral BID   rosuvastatin  40 mg Oral Daily   Labs ordered for review:  Routine labs: CBC/Diff, CMP, HgB A1c, Lipid Profile, Magnesium, Prolactin, TSH Lab Orders         CBC with Differential/Platelet         Comprehensive metabolic panel         Hemoglobin A1c         Magnesium         Ethanol         Prolactin         Urinalysis, Routine w reflex microscopic -Urine, Clean Catch         Lipid panel         TSH         POC urine preg, ED         POCT Urine Drug Screen - (I-Screen)     Improvement was monitored by staff observation and clinical report along with [name] 's verbal report of emotional status and symptom reduction.  Upon completion of this admission Kaitlyn Gray was both mentally and medically stable for discharge denying suicidal/homicidal ideation, auditory/visual/tactile hallucinations, delusional thoughts, and paranoia.    Kaitlyn Gray was evaluated by the treatment team for stability and plans for continued recovery upon discharge. Kaitlyn Gray 's motivation was an integral factor for scheduling further  treatment. Employment, transportation, bed availability, health status, family support, and any pending legal issues were also considered during stay. She was offered further treatment options upon discharge including but not limited to Residential, Intensive Outpatient, and Outpatient treatment.   [name] will follow up with    Discharge Instructions      Based on the information that you have provided and the presenting issues outpatient services and resources for have been recommended.  It is imperative that you follow through with treatment recommendations within 5-7 days from the of discharge to mitigate further risk to your safety and mental well-being. A list of referrals has been provided below to get you started.  You are not limited to the list  provided.  In case of an urgent crisis, you may contact the Mobile Crisis Unit with [facility] at [phone].  [facility] Transportation        Non-Emergency Transportation      FOR NON_MEDICAID RECEIPTANTS AND PERSON 84 YEARS OLD OR YOUNGER ONLY. ALL OTHERS MUST CALL [phone]. Non-Emergency Medical Transportation   [email or web address]   [facility]  [address]   [place] [address]  Phone [phone] Fax [phone]      [facility] Transportation  [address], [place], [place]?[address]  [phone]  Serving?[place]?& [place]         [email or web address]  [phone]  Serving [facility]  [address] [place] [place], [place] [address]  [phone]       .Marland KitchenOBS Care Management   For your behavioral health needs, you are advised to follow up with an outpatient provider.  You may be eligible for ACT Team services, which would include more  frequent visits with your provider, as well as in-home services.  The following providers offer ACT Team services.  Contact them at your earliest opportunity to ask about enrolling in their program:       [facility]      [address]      [place], [place] [address]      [phone] [address]      [place], [place] [address]      [phone]       [facility]      [address]      [place], [place] [address]      [phone]       [facility] ACT Team      [facility], [address]      [address]      [place], [place]  [address]      [phone]       [facility]      [address]      [place], [place] [address]      [phone]   Writer completed an referral form for ACTT services with [facility].  The ACTT Team will follow up with the patient to determine if she qualifies for ACTT services.     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) 07/19/2017   Hypertension    Hypertensive emergency 05/31/2022   Ischemic stroke (HCC) 01/01/2020   subacute right middle cerebellar peduncle and pons infarction   Left-sided weakness 01/27/2022   Head CT with remote right  occipital infarct which is consistent with  old left-sided weakness   MDD (major depressive disorder), recurrent severe, 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   Normocytic anemia 02/09/2020   Opiate use    History of Suboxone Therapy until 05/2021   Polysubstance abuse (HCC) 07/19/2017   Prescription drug misadventures (Seroquel)     Family History:  Family History  Problem Relation Age of Onset   Hypertension Mother    CAD Mother 36       died of MI at age 53   Hypertension Father     Family Psychiatric History: None reported Social History:  Social History   Tobacco Use   Smoking status: Some Days    Packs/day: .5    Types: Cigarettes    Passive exposure: Current   Smokeless tobacco: Former  Building services engineer Use: Never used  Substance Use Topics   Alcohol use: No   Drug use: Yes    Types: Marijuana, Cocaine    Tobacco Cessation:  A prescription for an FDA-approved tobacco cessation medication was offered at discharge and the patient refused  Current Medications:  Current Facility-Administered Medications  Medication Dose Route Frequency Provider Last Rate Last Admin   acetaminophen (TYLENOL) tablet 650 mg  650 mg Oral Q6H PRN Paiton Boultinghouse B, NP       alum & mag hydroxide-simeth (MAALOX/MYLANTA) 200-200-20 MG/5ML suspension 30 mL  30 mL Oral Q4H PRN Blessyn Sommerville B, NP   30 mL at 12/23/22 2138   amLODipine (NORVASC) tablet 10 mg  10 mg Oral Daily Vidhi Delellis B, NP   10 mg at 12/24/22 0935   aspirin EC tablet 81 mg  81 mg Oral Daily Jacobs Golab B, NP   81 mg at 12/24/22 0924   ferrous sulfate tablet 325 mg  325 mg Oral QODAY Jauna Raczynski B, NP   325 mg at 12/23/22 1614   hydrOXYzine (ATARAX) tablet 25 mg  25 mg Oral TID PRN Semir Brill B, NP   25 mg at 12/23/22 2134   losartan (COZAAR) tablet 100 mg  100 mg Oral Daily Shannen Vernon B, NP   100 mg at 12/24/22 0935   magnesium hydroxide (MILK OF MAGNESIA) suspension 30 mL  30 mL Oral Daily PRN Merial Moritz  B, NP       metFORMIN (GLUCOPHAGE) tablet 500 mg  500 mg Oral BID WC Sircharles Holzheimer B, NP   500 mg at 12/24/22 0808   nicotine polacrilex (NICORETTE) gum 2 mg  2 mg Oral Q2H PRN Charolett Yarrow B, NP       pantoprazole (PROTONIX) EC tablet 40 mg  40 mg Oral Daily Kyrstal Monterrosa B, NP   40 mg at 12/24/22 0925   potassium chloride SA (KLOR-CON M) CR tablet 20 mEq  20 mEq Oral BID Davisha Linthicum B, NP   20 mEq at 12/24/22 0925   rosuvastatin (CRESTOR) tablet 40 mg  40 mg Oral Daily Shrey Boike B, NP   40 mg at 12/24/22 0935   traZODone (DESYREL) tablet 50 mg  50 mg Oral QHS PRN Daja Shuping B, NP   50 mg at 12/23/22 2134   Current Outpatient Medications  Medication Sig Dispense Refill   amLODipine (NORVASC) 10 MG tablet Take 1 tablet (10 mg total) by mouth daily. 30 tablet 2   ARIPiprazole ER (ABILIFY MAINTENA) 400 MG SRER injection Inject 2 mLs (400 mg total) into the muscle every 28 (twenty-eight) days. 1 each 0   aspirin EC 81 MG tablet Take 81 mg by  mouth daily. Swallow whole.     ferrous sulfate 325 (65 FE) MG tablet Take 1 tablet (325 mg total) by mouth every other day. 15 tablet 2   gabapentin (NEURONTIN) 100 MG capsule Take 1 capsule (100 mg total) by mouth 3 (three) times daily for pain scale 4-7. 45 capsule 1   losartan (COZAAR) 100 MG tablet Take 1 tablet (100 mg total) by mouth daily. 30 tablet 2   metFORMIN (GLUCOPHAGE) 500 MG tablet Take 1 tablet (500 mg total) by mouth 2 (two) times daily with a meal. 60 tablet 2   nicotine polacrilex (NICORETTE) 2 MG gum Take 1 each (2 mg total) by mouth every 2 (two) hours as needed for Smoking Cravings (Patient taking differently: Take 2 mg by mouth every 2 (two) hours as needed for smoking cessation.) 180 each 1   pantoprazole (PROTONIX) 40 MG tablet Take 1 tablet (40 mg total) by mouth daily. 30 tablet 2   potassium chloride SA (KLOR-CON M) 20 MEQ tablet Take 1 tablet (20 mEq total) by mouth 2 (two) times daily. 6 tablet 0   rosuvastatin  (CRESTOR) 40 MG tablet Take 1 tablet (40 mg total) by mouth daily. 30 tablet 2    PTA Medications:  Facility Ordered Medications  Medication   acetaminophen (TYLENOL) tablet 650 mg   alum & mag hydroxide-simeth (MAALOX/MYLANTA) 200-200-20 MG/5ML suspension 30 mL   magnesium hydroxide (MILK OF MAGNESIA) suspension 30 mL   hydrOXYzine (ATARAX) tablet 25 mg   traZODone (DESYREL) tablet 50 mg   [COMPLETED] ARIPiprazole (ABILIFY) tablet 10 mg   [COMPLETED] ARIPiprazole ER (ABILIFY MAINTENA) 400 MG prefilled syringe 400 mg   aspirin EC tablet 81 mg   metFORMIN (GLUCOPHAGE) tablet 500 mg   rosuvastatin (CRESTOR) tablet 40 mg   amLODipine (NORVASC) tablet 10 mg   ferrous sulfate tablet 325 mg   potassium chloride SA (KLOR-CON M) CR tablet 20 mEq   nicotine polacrilex (NICORETTE) gum 2 mg   pantoprazole (PROTONIX) EC tablet 40 mg   losartan (COZAAR) tablet 100 mg   [COMPLETED] diphenhydrAMINE (BENADRYL) capsule 50 mg   Or   [COMPLETED] diphenhydrAMINE (BENADRYL) injection 50 mg   PTA Medications  Medication Sig   amLODipine (NORVASC) 10 MG tablet Take 1 tablet (10 mg total) by mouth daily.   losartan (COZAAR) 100 MG tablet Take 1 tablet (100 mg total) by mouth daily.   rosuvastatin (CRESTOR) 40 MG tablet Take 1 tablet (40 mg total) by mouth daily.   metFORMIN (GLUCOPHAGE) 500 MG tablet Take 1 tablet (500 mg total) by mouth 2 (two) times daily with a meal.   pantoprazole (PROTONIX) 40 MG tablet Take 1 tablet (40 mg total) by mouth daily.   ferrous sulfate 325 (65 FE) MG tablet Take 1 tablet (325 mg total) by mouth every other day.   aspirin EC 81 MG tablet Take 81 mg by mouth daily. Swallow whole.   gabapentin (NEURONTIN) 100 MG capsule Take 1 capsule (100 mg total) by mouth 3 (three) times daily for pain scale 4-7.   nicotine polacrilex (NICORETTE) 2 MG gum Take 1 each (2 mg total) by mouth every 2 (two) hours as needed for Smoking Cravings (Patient taking differently: Take 2 mg by mouth  every 2 (two) hours as needed for smoking cessation.)   potassium chloride SA (KLOR-CON M) 20 MEQ tablet Take 1 tablet (20 mEq total) by mouth 2 (two) times daily.       07/17/2022    1:34 PM 12/12/2021  5:37 PM 07/15/2017    7:12 PM  Depression screen PHQ 2/9  Decreased Interest  1 2  Down, Depressed, Hopeless 2 1 2   PHQ - 2 Score 2 2 4   Altered sleeping 3 2 1   Tired, decreased energy 3 2 1   Change in appetite 3 1 1   Feeling bad or failure about yourself  3 2 1   Trouble concentrating 3 1 1   Moving slowly or fidgety/restless 3 1 0  Suicidal thoughts 1 1 1   PHQ-9 Score 21 12 10   Difficult doing work/chores Extremely dIfficult Somewhat difficult Somewhat difficult    Flowsheet Row ED from 12/23/2022 in [facility] Most recent reading at 12/23/2022  9:47 PM ED from 12/22/2022 in [facility] Emergency Department at [facility] Most recent reading at 12/22/2022  4:07 PM ED from 12/22/2022 in [facility] Most recent reading at 12/22/2022  2:36 PM  C-SSRS RISK CATEGORY Error: Question 2 not populated High Risk Moderate Risk       Musculoskeletal  Strength & Muscle Tone:  left side weakness Gait & Station:  Uses a walker to assist with ambulation Patient leans: N/A  Psychiatric Specialty Exam  Presentation  General Appearance:  Appropriate for Environment  Eye Contact: Good  Speech: Clear and Coherent; Normal Rate  Speech Volume: Normal  Handedness: Left   Mood and Affect  Mood: Euthymic  Affect: Appropriate; Congruent   Thought Process  Thought Processes: Coherent; Goal Directed  Descriptions of Associations:Intact  Orientation:Full (Time, Place and Person)  Thought Content:Logical  Diagnosis of Schizophrenia or Schizoaffective disorder in past: Yes  Duration of Psychotic Symptoms: Greater than six months   Hallucinations:Hallucinations: None (Denies auditory and visual hallucinations at this  time) Description of Auditory Hallucinations: States she is hearing voices telling her to hurt herself, that she is ugly, and to lay in traffic Description of Visual Hallucinations: Reports she is seeing people in the walls  Ideas of Reference:None  Suicidal Thoughts:Suicidal Thoughts: No SI Active Intent and/or Plan: Without Intent (Stating if she is sent home she will take her seroquel until she doesn't wake up.  Chronic suicidal ideation.  Reports she really doesn't want to die)  Homicidal Thoughts:Homicidal Thoughts: No   Sensorium  Memory: Immediate Fair; Recent Fair; Remote Fair  Judgment: Intact  Insight: Present   Executive Functions  Concentration: Good  Attention Span: Good  Recall: Good  Fund of Knowledge: Fair  Language: Good   Psychomotor Activity  Psychomotor Activity: Psychomotor Activity: Normal   Assets  Assets: Communication Skills; Desire for Improvement; Financial Resources/Insurance; Housing; Resilience   Sleep  Sleep: Sleep: Good Number of Hours of Sleep: 0 (Reports she slept well last night)   Nutritional Assessment (For OBS and FBC admissions only) Has the patient had a weight loss or gain of 10 pounds or more in the last 3 months?: No Has the patient had a decrease in food intake/or appetite?: No Does the patient have dental problems?: No Does the patient have eating habits or behaviors that may be indicators of an eating disorder including binging or inducing vomiting?: No Has the patient recently lost weight without trying?: 0 Has the patient been eating poorly because of a decreased appetite?: 0 Malnutrition Screening Tool Score: 0    Physical Exam  Physical Exam Vitals and nursing note reviewed. Exam conducted with a chaperone present.  Constitutional:      General: She is not in acute distress.    Appearance:  Normal appearance. She is not ill-appearing.  HENT:     Head: Normocephalic.  Eyes:      Conjunctiva/sclera: Conjunctivae normal.  Cardiovascular:     Rate and Rhythm: Normal rate.  Pulmonary:     Effort: Pulmonary effort is normal.  Musculoskeletal:        General: Normal range of motion.     Cervical back: Normal range of motion.     Comments: Left side weakness from stroke   Skin:    General: Skin is warm and dry.  Neurological:     Mental Status: She is alert and oriented to person, place, and time.     Comments: History of 3 prior strokes and left side weakness  Psychiatric:        Attention and Perception: Attention and perception normal. She does not perceive auditory or visual hallucinations.        Mood and Affect: Mood and affect normal.        Speech: Speech normal.        Behavior: Behavior normal. Behavior is cooperative.        Thought Content: Thought content normal. Thought content is not paranoid or delusional. Thought content does not include homicidal or suicidal ideation.        Cognition and Memory: Abnormal recent memory: Reports she has trouble with her short term memory related to her strokes.        Judgment: Judgment normal.    Review of Systems  Constitutional:        No other complaints voiced  Musculoskeletal:        Uses walker to assist with ambulation because of left side weakness  Neurological:        3 prior strokes  Psychiatric/Behavioral:  Positive for substance abuse (Cocaine and marijuana). Depression: Stable. Hallucinations: Denies. Memory loss: Reports she has a problem with her short term memory. Suicidal ideas: Denies.Nervous/anxious: Stable. Insomnia: Denies at this time stating she slept well last night.        States she wanted to be put on LAI because of her short term memory and having trouble taking her PO meds.  States she will also need assistance reminding her appointment and if there was a way she could receive a reminder prior to appointment  All other systems reviewed and are negative.  Blood pressure (!) 143/69,  pulse 62, temperature 98.5 F (36.9 C), temperature source Oral, resp. rate 18, SpO2 100 %. There is no height or weight on file to calculate BMI.  Demographic Factors:  Low socioeconomic status and Living alone  Loss Factors: Financial problems/change in socioeconomic status  Historical Factors: Impulsivity  Risk Reduction Factors:   Religious beliefs about death and Positive therapeutic relationship  Continued Clinical Symptoms:  Alcohol/Substance Abuse/Dependencies Previous Psychiatric Diagnoses and Treatments  Cognitive Features That Contribute To Risk:  None    Suicide Risk:  Minimal: No identifiable suicidal ideation.  Patients presenting with no risk factors but with morbid ruminations; may be classified as minimal risk based on the severity of the depressive symptoms  Plan Of Care/Follow-up recommendations:  Other:  Follow up with resources given and keep scheduled appointment  Disposition: No evidence of imminent risk to self or others at present.   Patient does not meet criteria for psychiatric inpatient admission. Supportive therapy provided about ongoing stressors. Discussed crisis plan, support from social network, calling 911, coming to the Emergency Department, and calling Suicide Hotline.  Edda Orea, NP 12/24/2022, 10:20 AM

## 2022-12-26 ENCOUNTER — Emergency Department (HOSPITAL_COMMUNITY)
Admission: EM | Admit: 2022-12-26 | Discharge: 2022-12-26 | Disposition: A | Discharge status: Home / Self Care | Payer: MEDICAID | Attending: Emergency Medicine | Admitting: Emergency Medicine

## 2022-12-26 ENCOUNTER — Other Ambulatory Visit: Payer: Self-pay

## 2022-12-26 ENCOUNTER — Encounter (HOSPITAL_COMMUNITY): Payer: Self-pay

## 2022-12-26 DIAGNOSIS — R270 Ataxia, unspecified: Secondary | ICD-10-CM | POA: Diagnosis not present

## 2022-12-26 DIAGNOSIS — R531 Weakness: Secondary | ICD-10-CM | POA: Insufficient documentation

## 2022-12-26 DIAGNOSIS — Z7982 Long term (current) use of aspirin: Secondary | ICD-10-CM | POA: Diagnosis not present

## 2022-12-26 DIAGNOSIS — W19XXXA Unspecified fall, initial encounter: Secondary | ICD-10-CM | POA: Diagnosis not present

## 2022-12-26 DIAGNOSIS — E119 Type 2 diabetes mellitus without complications: Secondary | ICD-10-CM | POA: Diagnosis not present

## 2022-12-26 DIAGNOSIS — Z79899 Other long term (current) drug therapy: Secondary | ICD-10-CM | POA: Insufficient documentation

## 2022-12-26 DIAGNOSIS — Z7984 Long term (current) use of oral hypoglycemic drugs: Secondary | ICD-10-CM | POA: Insufficient documentation

## 2022-12-26 DIAGNOSIS — I10 Essential (primary) hypertension: Secondary | ICD-10-CM | POA: Insufficient documentation

## 2022-12-26 DIAGNOSIS — Z91040 Latex allergy status: Secondary | ICD-10-CM | POA: Diagnosis not present

## 2022-12-26 DIAGNOSIS — F17200 Nicotine dependence, unspecified, uncomplicated: Secondary | ICD-10-CM | POA: Insufficient documentation

## 2022-12-26 NOTE — ED Triage Notes (Signed)
Pt called EMS from home, on arrival found to on her sidewalk with her walker c/o muliple falls,would not say when. C/o bilateral knee pain, denies hitting head, denies thinners

## 2022-12-26 NOTE — ED Provider Notes (Signed)
[place] EMERGENCY DEPARTMENT AT [facility] Provider Note  CSN: [id number] Arrival date & time: 12/26/22 0507  Chief Complaint(s) Fall  HPI Kaitlyn Gray is a 47 y.o. 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   Fall This is a recurrent problem. The current episode started 12 to 24 hours ago. Pertinent negatives include no chest pain, no abdominal pain, no headaches and no shortness of breath. Nothing aggravates the symptoms. Nothing relieves the symptoms. She has tried nothing for the symptoms.   Reports that she fell onto her knees but denies any knee pain at this time.  No head trauma or loss of consciousness.  Only complaint is being hungry.  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    Encounter for assessment of healthcare decision-making capacity    History of cervical fracture 12/24/2017   nondisplaced fracture lateral mass of C1 on the right on CT 12/24/17   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) 07/19/2017   Hypertension    Hypertensive emergency 05/31/2022   Ischemic stroke (HCC) 01/01/2020   subacute right middle cerebellar peduncle and pons infarction   Left-sided weakness 01/27/2022   Head CT with remote right occipital infarct which is consistent with old left-sided weakness   MDD (major depressive disorder), recurrent severe, 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   Normocytic anemia 02/09/2020   Opiate use    History of Suboxone Therapy until 05/2021   Polysubstance abuse (HCC) 07/19/2017   Prescription drug misadventures (Seroquel)     Patient Active Problem List   Diagnosis Date Noted   Bipolar affective disorder, currently depressed, moderate (HCC) 12/23/2022   Malingering 12/22/2022   Inability to access community resources due to transportation insecurity 12/22/2022   Stroke-like symptoms 08/05/2022   Toxic encephalopathy 08/04/2022   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) 05/01/2022   Iron deficiency anemia 04/07/2022   Fall at home, initial encounter 04/07/2022   Ambulatory dysfunction 04/07/2022   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 04/07/2022   Class 1 obesity 04/06/2022   Tobacco abuse 04/06/2022   Abdominal pain 04/04/2022   History of stroke 03/31/2022   Hemiparesis affecting left side as late effect of cerebrovascular accident (CVA) (HCC) 03/31/2022   Opiate use    Microcytic anemia 03/30/2022   Noncompliance with medications    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   History of cervical fracture 12/24/2017   Type 2 diabetes mellitus (HCC) 12/06/2017   Hypertension associated with diabetes (HCC) 12/06/2017   Bipolar disorder (HCC) 07/19/2017   Polysubstance abuse (HCC) 07/19/2017   Depression 03/24/2015   MDD (major depressive disorder), recurrent, severe, with psychosis (HCC) 03/23/2015   Cannabis abuse 03/23/2013   Home Medication(s) Prior to Admission medications   Medication Sig Start Date End Date Taking? Authorizing Provider  amLODipine (NORVASC) 10 MG tablet Take 1 tablet (10 mg total) by mouth daily. 06/04/22 12/24/22  [place], Alvira Philips, DO  ARIPiprazole ER (ABILIFY MAINTENA) 400 MG SRER injection Inject 2 mLs (400 mg total) into the muscle every 28 (twenty-eight) days. 12/23/22   Rankin, Shuvon B,  NP  aspirin EC 81 MG tablet Take 81 mg by mouth daily. Swallow whole.    [provider]  ferrous sulfate 325 (65 FE) MG tablet Take 1 tablet (325 mg total) by mouth every other day. 06/04/22 12/24/22  [place], Alvira Philips, DO   gabapentin (NEURONTIN) 100 MG capsule Take 1 capsule (100 mg total) by mouth 3 (three) times daily for pain scale 4-7. 12/01/22     losartan (COZAAR) 100 MG tablet Take 1 tablet (100 mg total) by mouth daily. 06/04/22 12/24/22  [place], Alvira Philips, DO  metFORMIN (GLUCOPHAGE) 500 MG tablet Take 1 tablet (500 mg total) by mouth 2 (two) times daily with a meal. 06/04/22 12/24/22  [place], Alvira Philips, DO  nicotine polacrilex (NICORETTE) 2 MG gum Take 1 each (2 mg total) by mouth every 2 (two) hours as needed for Smoking Cravings Patient taking differently: Take 2 mg by mouth every 2 (two) hours as needed for smoking cessation. 12/01/22     pantoprazole (PROTONIX) 40 MG tablet Take 1 tablet (40 mg total) by mouth daily. 06/04/22 12/24/22  [place], Alvira Philips, DO  potassium chloride SA (KLOR-CON M) 20 MEQ tablet Take 1 tablet (20 mEq total) by mouth 2 (two) times daily. 12/22/22   Rancour, Jeannett Senior, MD  rosuvastatin (CRESTOR) 40 MG tablet Take 1 tablet (40 mg total) by mouth daily. 06/04/22 12/24/22  [place], Eric J, DO                                                                                                                                    Allergies Tomato, Beef-derived products, Hydrocodone, and Latex  Review of Systems Review of Systems  Respiratory:  Negative for shortness of breath.   Cardiovascular:  Negative for chest pain.  Gastrointestinal:  Negative for abdominal pain.  Neurological:  Negative for headaches.   As noted in HPI  Physical Exam Vital Signs  I have reviewed the triage vital signs BP (!) 153/82 (BP Location: Right Arm)   Pulse 70   Temp 98.2 F (36.8 C) (Oral)   Resp 16   Ht 5\' 3"  (1.6 m)   Wt 81.6 kg   LMP  (LMP Unknown)   SpO2 99%   BMI 31.87 kg/m   Physical Exam Constitutional:      General: She is not in acute distress.    Appearance: She is well-developed. She is not diaphoretic.  HENT:     Head: Normocephalic and atraumatic.     Right Ear: External ear normal.      Left Ear: External ear normal.     Nose: Nose normal.  Eyes:     General: No scleral icterus.       Right eye: No discharge.        Left eye: No discharge.     Conjunctiva/sclera: Conjunctivae normal.     Pupils: Pupils are equal, round, and reactive to light.  Cardiovascular:  Rate and Rhythm: Normal rate and regular rhythm.     Pulses:          Radial pulses are 2+ on the right side and 2+ on the left side.       Dorsalis pedis pulses are 2+ on the right side and 2+ on the left side.     Heart sounds: Normal heart sounds. No murmur heard.    No friction rub. No gallop.  Pulmonary:     Effort: Pulmonary effort is normal. No respiratory distress.     Breath sounds: Normal breath sounds. No stridor. No wheezing.  Abdominal:     General: There is no distension.     Palpations: Abdomen is soft.     Tenderness: There is no abdominal tenderness.  Musculoskeletal:        General: No tenderness.     Cervical back: Normal range of motion and neck supple. No bony tenderness.     Thoracic back: No bony tenderness.     Lumbar back: No bony tenderness.     Comments: Clavicles stable. Chest stable to AP/Lat compression. Pelvis stable to Lat compression. No obvious extremity deformity. No chest or abdominal wall contusion.  Skin:    General: Skin is warm and dry.     Findings: No erythema or rash.  Neurological:     Mental Status: She is alert and oriented to person, place, and time.     Comments: Baseline left sided deficits     ED Results and Treatments Labs (all labs ordered are listed, but only abnormal results are displayed) Labs Reviewed - No data to display                                                                                                                       EKG  EKG Interpretation Date/Time:    Ventricular Rate:    PR Interval:    QRS Duration:    QT Interval:    QTC Calculation:   R Axis:      Text Interpretation:         Radiology No results  found.  Medications Ordered in ED Medications - No data to display Procedures Procedures  (including critical care time) Medical Decision Making / ED Course   Medical Decision Making   Exam not concerning for any serious internal injuries requiring labs or imaging at this time. Patient provided with food and oral hydration Tolerated well.    Final Clinical Impression(s) / ED Diagnoses Final diagnoses:  Fall, initial encounter   The patient appears reasonably screened and/or stabilized for discharge and I doubt any other medical condition or other [facility] requiring further screening, evaluation, or treatment in the ED at this time. I have discussed the findings, Dx and Tx plan with the patient/family who expressed understanding and agree(s) with the plan. Discharge instructions discussed at length. The patient/family was given strict return precautions who verbalized understanding of the instructions. No further questions at time of discharge.  Disposition: Discharge  Condition: Good  ED Discharge Orders     None       Follow Up: Primary care provider  Call  to schedule an appointment for close follow up    This chart was dictated using voice recognition software.  Despite best efforts to proofread,  errors can occur which can change the documentation meaning.    Nira Conn, MD 12/26/22 0700

## 2022-12-26 NOTE — ED Notes (Signed)
Pt given taxi voucher approved by SW. Pt in bathroom given DC paper work and AVS reviewed with Charity fundraiser. Pt didn't want to miss her taxi ride no 1hour VS completed. Pt walked out of ER

## 2022-12-27 ENCOUNTER — Other Ambulatory Visit: Payer: Self-pay

## 2022-12-27 ENCOUNTER — Emergency Department (HOSPITAL_COMMUNITY)
Admission: EM | Admit: 2022-12-27 | Discharge: 2022-12-27 | Disposition: A | Discharge status: Home / Self Care | Payer: MEDICAID | Attending: Emergency Medicine | Admitting: Emergency Medicine

## 2022-12-27 ENCOUNTER — Encounter (HOSPITAL_COMMUNITY): Payer: Self-pay | Admitting: Emergency Medicine

## 2022-12-27 DIAGNOSIS — R0602 Shortness of breath: Secondary | ICD-10-CM | POA: Diagnosis present

## 2022-12-27 DIAGNOSIS — R531 Weakness: Secondary | ICD-10-CM | POA: Insufficient documentation

## 2022-12-27 DIAGNOSIS — R4701 Aphasia: Secondary | ICD-10-CM | POA: Diagnosis not present

## 2022-12-27 LAB — BASIC METABOLIC PANEL
Anion gap: 9 (ref 5–15)
BUN: 12 mg/dL (ref 6–20)
CO2: 22 mmol/L (ref 22–32)
Calcium: 8.7 mg/dL — ABNORMAL LOW (ref 8.9–10.3)
Chloride: 109 mmol/L (ref 98–111)
Creatinine, Ser: 0.76 mg/dL (ref 0.44–1.00)
GFR, Estimated: >60 mL/min (ref >60)
Glucose, Bld: 140 mg/dL — ABNORMAL HIGH (ref 70–99)
Potassium: 3.5 mmol/L (ref 3.5–5.1)
Sodium: 140 mmol/L (ref 135–145)

## 2022-12-27 LAB — CBC
HCT: 29 % — ABNORMAL LOW (ref 36.0–46.0)
Hemoglobin: 8.4 g/dL — ABNORMAL LOW (ref 12.0–15.0)
MCH: 22 pg — ABNORMAL LOW (ref 26.0–34.0)
MCHC: 29 g/dL — ABNORMAL LOW (ref 30.0–36.0)
MCV: 76.1 fL — ABNORMAL LOW (ref 80.0–100.0)
Platelets: 269 10*3/uL (ref 150–400)
RBC: 3.81 MIL/uL — ABNORMAL LOW (ref 3.87–5.11)
RDW: 20.9 % — ABNORMAL HIGH (ref 11.5–15.5)
WBC: 5.2 10*3/uL (ref 4.0–10.5)
nRBC: 0 % (ref 0.0–0.2)

## 2022-12-27 LAB — HCG, SERUM, QUALITATIVE: Preg, Serum: NEGATIVE

## 2022-12-27 NOTE — ED Provider Notes (Signed)
[place] EMERGENCY DEPARTMENT AT [facility] Provider Note   CSN: [id number] Arrival date & time: 12/27/22  1420     History Chief Complaint  Patient presents with   Shortness of Breath    HPI Kaitlyn Gray is a 47 y.o. female presenting for chief complaint of shortness of breath and difficulty with word finding.  States symptoms well relieved by time I evaluated her.  States she thinks it was because her house was hot.  Came via EMS.  She denies any symptoms this time ambulatory tolerating p.o. intake asking for food.  Frequently seen for similar presentations.  Immediately stated she would like to be discharged.   Patient's recorded medical, surgical, social, medication list and allergies were reviewed in the Snapshot window as part of the initial history.   Review of Systems   Review of Systems  Constitutional:  Negative for chills and fever.  HENT:  Negative for ear pain and sore throat.   Eyes:  Negative for pain and visual disturbance.  Respiratory:  Positive for shortness of breath. Negative for cough.   Cardiovascular:  Negative for chest pain and palpitations.  Gastrointestinal:  Negative for abdominal pain and vomiting.  Genitourinary:  Negative for dysuria and hematuria.  Musculoskeletal:  Negative for arthralgias and back pain.  Skin:  Negative for color change and rash.  Neurological:  Positive for speech difficulty. Negative for seizures and syncope.  All other systems reviewed and are negative.   Physical Exam Updated Vital Signs BP (!) 180/99   Pulse 69   Temp 98.2 F (36.8 C) (Oral)   Resp 18   Ht 5\' 3"  (1.6 m)   Wt 81 kg   LMP 12/07/2022 (Approximate)   SpO2 100%   BMI 31.63 kg/m  Physical Exam Vitals and nursing note reviewed.  Constitutional:      General: She is not in acute distress.    Appearance: She is well-developed.  HENT:     Head: Normocephalic and atraumatic.  Eyes:     Conjunctiva/sclera: Conjunctivae normal.   Cardiovascular:     Rate and Rhythm: Normal rate and regular rhythm.     Heart sounds: No murmur heard. Pulmonary:     Effort: Pulmonary effort is normal. No respiratory distress.     Breath sounds: Normal breath sounds.  Abdominal:     General: There is no distension.     Palpations: Abdomen is soft.     Tenderness: There is no abdominal tenderness. There is no right CVA tenderness or left CVA tenderness.  Musculoskeletal:        General: No swelling or tenderness. Normal range of motion.     Cervical back: Neck supple.  Skin:    General: Skin is warm and dry.  Neurological:     General: No focal deficit present.     Mental Status: She is alert and oriented to person, place, and time. Mental status is at baseline.     Cranial Nerves: No cranial nerve deficit.      ED Course/ Medical Decision Making/ A&P    Procedures Procedures   Medications Ordered in ED Medications - No data to display Medical Decision Making:   Kaitlyn Gray is a 47 y.o. female who presented to the ED today with altered mental status detailed above.    Handoff received from EMS.  Patient placed on continuous vitals and telemetry monitoring while in ED which was reviewed periodically.  Complete initial physical exam performed, notably  the patient  was hemodynamically stable no acute distress currently ambulatory tolerating p.o. intake.    Reviewed and confirmed nursing documentation for past medical history, family history, social history.    Initial Assessment:   With the patient's presentation of altered mental status, most likely diagnosis is delerium 2/2 infectious etiology (UTI/CAP/URI) vs metabolic abnormality (Na/K/Mg/Ca) vs nonspecific etiology. Other diagnoses were considered including (but not limited to) CVA, ICH, intracranial mass, critical dehydration, heptatic dysfunction, uremia, hypercarbia, intoxication, endrocrine abnormality, toxidrome. These are considered less likely due to history  of present illness and physical exam findings.   This is most consistent with an acute life/limb threatening illness complicated by underlying chronic conditions.  Initial Plan:  Screening labs including CBC and Metabolic panel to evaluate for infectious or metabolic etiology of disease.  Urinalysis with reflex culture ordered to evaluate for UTI or relevant urologic/nephrologic pathology.  Objective evaluation as below reviewed   Initial Study Results:   Laboratory  All laboratory results reviewed without evidence of clinically relevant pathology.    Radiology:  All images reviewed independently. Agree with radiology report at this time.   CT HEAD CODE STROKE WO CONTRAST  Result Date: 12/17/2022 CLINICAL DATA:  Code stroke. Neuro deficit with acute stroke suspected left-sided deficits. EXAM: CT HEAD WITHOUT CONTRAST TECHNIQUE: Contiguous axial images were obtained from the base of the skull through the vertex without intravenous contrast. RADIATION DOSE REDUCTION: This exam was performed according to the departmental dose-optimization program which includes automated exposure control, adjustment of the mA and/or kV according to patient size and/or use of iterative reconstruction technique. COMPARISON:  Head CT 12/06/2022 FINDINGS: Brain: No evidence of acute infarction, hemorrhage, hydrocephalus, extra-axial collection or mass lesion/mass effect. Chronic right occipital and left superior frontal parietal cortex infarcts. Chronic low-density in the cerebral white matter with encephalomalacia in the right brachium pontis. Vascular: No hyperdense vessel or unexpected calcification. Skull: Normal. Negative for fracture or focal lesion. Sinuses/Orbits: No acute finding. Other: These results were communicated to Dr. Derry Lory at 6:20 am on 12/17/2022 by epic chat. ASPECTS [facility] Stroke Program Early CT Score) - Ganglionic level infarction (caudate, lentiform nuclei, internal capsule, insula, M1-M3  cortex): 7 - Supraganglionic infarction (M4-M6 cortex): 3 Total score (0-10 with 10 being normal): 10 IMPRESSION: 1. No acute finding. 2. Chronic white matter disease and cortical infarcts. Electronically Signed   By: Tiburcio Pea M.D.   On: 12/17/2022 06:22   MR BRAIN WO CONTRAST  Result Date: 12/06/2022 CLINICAL DATA:  Stroke follow-up EXAM: MRI HEAD WITHOUT CONTRAST TECHNIQUE: Multiplanar, multiecho pulse sequences of the brain and surrounding structures were obtained without intravenous contrast. COMPARISON:  Head CT from earlier today.  Brain MRI 10/23/2022 FINDINGS: Brain: No acute infarction, hemorrhage, hydrocephalus, extra-axial collection or mass lesion. Chronic infarcts in the right brachium pontis/cerebellum, bilateral deep gray nuclei, right occipital cortex, corpus callosum, brainstem, and left parietal cortex and subjacent white matter. Premature chronic small vessel ischemia in the cerebral white matter. Chronic blood products are seen at the old infarcts. Vascular: Normal flow voids. Skull and upper cervical spine: Normal marrow signal. Sinuses/Orbits: Negative. IMPRESSION: 1. No acute or reversible finding. 2. 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 Electronically Signed   By: Tiburcio Pea M.D.   On: 12/06/2022 06:33   CT Head Wo Contrast  Result Date: 12/06/2022 CLINICAL DATA:  Neuro deficit, acute stroke suspected EXAM: CT HEAD WITHOUT CONTRAST TECHNIQUE: Contiguous axial images were obtained from the base of the skull  through the vertex without intravenous contrast. RADIATION DOSE REDUCTION: This exam was performed according to the departmental dose-optimization program which includes automated exposure control, adjustment of the mA and/or kV according to patient size and/or use of iterative reconstruction technique. COMPARISON:  CT head 11/20/2022 FINDINGS: Brain: No intracranial hemorrhage, mass effect, or evidence of acute infarct. No  hydrocephalus. No extra-axial fluid collection. Generalized cerebral atrophy. Ill-defined hypoattenuation within the cerebral white matter is nonspecific but consistent with chronic small vessel ischemic disease. Chronic right occipital and left posterior parietal infarcts. Vascular: No hyperdense vessel. Intracranial arterial calcification. Skull: No fracture or focal lesion. Sinuses/Orbits: Orbits are unremarkable. Mucosal thickening in the ethmoid air cells. Other: None. IMPRESSION: 1. No evidence of acute intracranial abnormality. 2. Chronic small vessel ischemic disease and chronic infarcts. Electronically Signed   By: Minerva Fester M.D.   On: 12/06/2022 00:57     Final Assessment and Plan:   On reassessment patient is in no acute distress all lab work grossly reassuring.  She has had extensive evaluations recently had no new symptoms.  Patient was offered further workup but states that she would just like to beDischarged she has already ambulated, had a meal, and was noted to be walking out of the emergency department while I was interviewing another patient.  Given well appearance, I believe this is reasonable w/ no acute indication for further intervention in the emergency room.    Clinical Impression:  1. Weakness      Discharge   Final Clinical Impression(s) / ED Diagnoses Final diagnoses:  Weakness    Rx / DC Orders ED Discharge Orders     None         Glyn Ade, MD 12/27/22 1530

## 2022-12-27 NOTE — ED Provider Triage Note (Signed)
Emergency Medicine Provider Triage Evaluation Note  Kaitlyn Gray , a 47 y.o. female  was evaluated in triage.  Pt complains of sob.  Patient states that he was out in the heat and was having some shortness of breath but that has resolved.  States that she has pain to the left side of her body and was evaluated yesterday but they did not do anything for stroke evaluation and she worries.  Symptoms are unchanged.  No other complaint.  Review of Systems  Positive: As above Negative: As above  Physical Exam  BP (!) 180/99   Pulse 69   Temp 98.2 F (36.8 C) (Oral)   Resp 18   Ht 5\' 3"  (1.6 m)   Wt 81 kg   LMP 12/07/2022 (Approximate)   SpO2 100%   BMI 31.63 kg/m  Gen:   Awake, no distress   Resp:  Normal effort  MSK:   Moves extremities without difficulty  Other:    Medical Decision Making  Medically screening exam initiated at 2:37 PM.  Appropriate orders placed.  Kaitlyn Gray was informed that the remainder of the evaluation will be completed by another provider, this initial triage assessment does not replace that evaluation, and the importance of remaining in the ED until their evaluation is complete.     Fayrene Helper, PA-C 12/27/22 1437

## 2022-12-27 NOTE — ED Triage Notes (Signed)
Pt BIB EMS for initial complaints of SOB but when EMS arrived pt stated she was SOB due to being hot in her home. Pt states pain on left side hurts some. Pt states speech is slower and hard to get words out for the past 2 days . States she has had strokes in the past and wants to make sure she is not having a stroke.

## 2023-01-01 ENCOUNTER — Emergency Department (HOSPITAL_COMMUNITY)
Admission: EM | Admit: 2023-01-01 | Discharge: 2023-01-01 | Disposition: A | Discharge status: Home / Self Care | Payer: MEDICAID | Attending: Emergency Medicine | Admitting: Emergency Medicine

## 2023-01-01 ENCOUNTER — Other Ambulatory Visit: Payer: Self-pay

## 2023-01-01 ENCOUNTER — Encounter (HOSPITAL_COMMUNITY): Payer: Self-pay

## 2023-01-01 DIAGNOSIS — M79606 Pain in leg, unspecified: Secondary | ICD-10-CM | POA: Insufficient documentation

## 2023-01-01 DIAGNOSIS — Z91040 Latex allergy status: Secondary | ICD-10-CM | POA: Insufficient documentation

## 2023-01-01 DIAGNOSIS — R5383 Other fatigue: Secondary | ICD-10-CM | POA: Insufficient documentation

## 2023-01-01 DIAGNOSIS — Z7982 Long term (current) use of aspirin: Secondary | ICD-10-CM | POA: Insufficient documentation

## 2023-01-01 DIAGNOSIS — Z79899 Other long term (current) drug therapy: Secondary | ICD-10-CM | POA: Insufficient documentation

## 2023-01-01 LAB — CBC WITH DIFFERENTIAL/PLATELET
Abs Immature Granulocytes: 0 10*3/uL (ref 0.00–0.07)
Basophils Absolute: 0 10*3/uL (ref 0.0–0.1)
Basophils Relative: 0 %
Eosinophils Absolute: 0.1 10*3/uL (ref 0.0–0.5)
Eosinophils Relative: 1 %
HCT: 28 % — ABNORMAL LOW (ref 36.0–46.0)
Hemoglobin: 8.2 g/dL — ABNORMAL LOW (ref 12.0–15.0)
Lymphocytes Relative: 24 %
Lymphs Abs: 1.3 10*3/uL (ref 0.7–4.0)
MCH: 22.4 pg — ABNORMAL LOW (ref 26.0–34.0)
MCHC: 29.3 g/dL — ABNORMAL LOW (ref 30.0–36.0)
MCV: 76.5 fL — ABNORMAL LOW (ref 80.0–100.0)
Monocytes Absolute: 0.4 10*3/uL (ref 0.1–1.0)
Monocytes Relative: 7 %
Neutro Abs: 3.8 10*3/uL (ref 1.7–7.7)
Neutrophils Relative %: 68 %
Platelets: 320 10*3/uL (ref 150–400)
RBC: 3.66 MIL/uL — ABNORMAL LOW (ref 3.87–5.11)
RDW: 21.2 % — ABNORMAL HIGH (ref 11.5–15.5)
WBC: 5.6 10*3/uL (ref 4.0–10.5)
nRBC: 0 % (ref 0.0–0.2)
nRBC: 0 /100 WBC

## 2023-01-01 LAB — BASIC METABOLIC PANEL
Anion gap: 6 (ref 5–15)
BUN: 13 mg/dL (ref 6–20)
CO2: 22 mmol/L (ref 22–32)
Calcium: 8.4 mg/dL — ABNORMAL LOW (ref 8.9–10.3)
Chloride: 107 mmol/L (ref 98–111)
Creatinine, Ser: 0.77 mg/dL (ref 0.44–1.00)
GFR, Estimated: >60 mL/min (ref >60)
Glucose, Bld: 182 mg/dL — ABNORMAL HIGH (ref 70–99)
Potassium: 3.3 mmol/L — ABNORMAL LOW (ref 3.5–5.1)
Sodium: 135 mmol/L (ref 135–145)

## 2023-01-01 LAB — CBG MONITORING, ED: Glucose-Capillary: 152 mg/dL — ABNORMAL HIGH (ref 70–99)

## 2023-01-01 NOTE — ED Notes (Signed)
Pt stood up without assistance in the lobby and didn't lock her Rolator walker. Pt fell on the ground. Sort tech, [employer], and triage tech assisted pt into a wheelchair. Pt taken to triage for cbg and to be evaluated by triage staff.

## 2023-01-01 NOTE — ED Notes (Signed)
Pt was advised 7 times by this NT to please sit back down in her wheelchair and ask for assistance when they need help with something. Pt refused to sit back down and insisted on walked. Pt stated "I dont have to listen to you." Pt stepped threw steps and threw herself on the floor. Pt was assisted back into wheelchair and brought to a stretcher in [address]. Charge nurse notified of incident.

## 2023-01-01 NOTE — ED Provider Triage Note (Signed)
Emergency Medicine Provider Triage Evaluation Note  Kaitlyn Gray , a 47 y.o. female  was evaluated in triage.  Pt complains of slowing of her speech and of her movements.  She thinks that this started this morning.  This is reportedly abnormal for her.  She called EMS.  She denies headache denies back pain.  She thinks maybe she had another stroke.  Review of Systems  Positive: Weakness, slowed speech Negative: Chest pain, sob, back pain, headache  Physical Exam  BP (!) 154/90 (BP Location: Right Arm)   Pulse 69   Temp 98 F (36.7 C) (Oral)   Resp 18   Ht 5\' 3"  (1.6 m)   Wt 80.7 kg   LMP 12/07/2022 (Approximate)   SpO2 100%   BMI 31.53 kg/m  Gen:   Awake, no distress  Resp:  Normal effort  MSK:   Patient wont move lower extremities, reflexes are 2+ and equal.  She complains of some pain in the left ankle there is no obvious erythema no signs of trauma. Other:  Slurred speech that seems to improve with effort  Medical Decision Making  Medically screening exam initiated at 12:38 PM.  Appropriate orders placed.  Kaitlyn Gray was informed that the remainder of the evaluation will be completed by another provider, this initial triage assessment does not replace that evaluation, and the importance of remaining in the ED until their evaluation is complete.  47 yo F well-known to this emergency department with 18 visits in the past 6 months comes in with a chief complaint of slurred speech and slowed gait.  She tells me that she think she had another stroke.  On my record review the patient has had multiple MRIs, her last stroke was in October of last year.  Since then she has had 2 MRIs and multiple CT scans.  Seems less likely to be a stroke with bilateral lower extremity weakness.  She is not describing any low back pain that would make me think the spinal pathology.  Will start with initial blood work.   Melene Plan, DO 01/01/23 1240

## 2023-01-01 NOTE — Progress Notes (Signed)
TOC consulted for transportation resources. Notified by RN that care team is calling her a cab. Pt has her walker with her. No further TOC needs.

## 2023-01-01 NOTE — Discharge Instructions (Addendum)
Follow-up with your neurologist in the office. ?

## 2023-01-01 NOTE — ED Provider Notes (Signed)
[facility] EMERGENCY DEPARTMENT AT [facility] Provider Note   CSN: [id number] Arrival date & time: 01/01/23  1213     History  Chief Complaint  Patient presents with   Leg Pain    Kaitlyn Gray is a 47 y.o. female.  47 yo F with a chief complaints of feeling like her speech is slower and she has been slower with ambulation than normal.  She called EMS because she is concerned that she has had another stroke.  She denies any recent injury.  No fevers.  Has been eating and drinking normally.   Leg Pain      Home Medications Prior to Admission medications   Medication Sig Start Date End Date Taking? Authorizing Provider  amLODipine (NORVASC) 10 MG tablet Take 1 tablet (10 mg total) by mouth daily. 06/04/22 12/24/22  [place], Alvira Philips, DO  ARIPiprazole ER (ABILIFY MAINTENA) 400 MG SRER injection Inject 2 mLs (400 mg total) into the muscle every 28 (twenty-eight) days. 12/23/22   Rankin, Shuvon B, NP  aspirin EC 81 MG tablet Take 81 mg by mouth daily. Swallow whole.    [provider]  ferrous sulfate 325 (65 FE) MG tablet Take 1 tablet (325 mg total) by mouth every other day. 06/04/22 12/24/22  [place], Alvira Philips, DO  gabapentin (NEURONTIN) 100 MG capsule Take 1 capsule (100 mg total) by mouth 3 (three) times daily for pain scale 4-7. 12/01/22     losartan (COZAAR) 100 MG tablet Take 1 tablet (100 mg total) by mouth daily. 06/04/22 12/24/22  [place], Alvira Philips, DO  metFORMIN (GLUCOPHAGE) 500 MG tablet Take 1 tablet (500 mg total) by mouth 2 (two) times daily with a meal. 06/04/22 12/24/22  [place], Alvira Philips, DO  nicotine polacrilex (NICORETTE) 2 MG gum Take 1 each (2 mg total) by mouth every 2 (two) hours as needed for Smoking Cravings Patient taking differently: Take 2 mg by mouth every 2 (two) hours as needed for smoking cessation. 12/01/22     pantoprazole (PROTONIX) 40 MG tablet Take 1 tablet (40 mg total) by mouth daily. 06/04/22 12/24/22  [place], Alvira Philips, DO  potassium  chloride SA (KLOR-CON M) 20 MEQ tablet Take 1 tablet (20 mEq total) by mouth 2 (two) times daily. 12/22/22   Rancour, Jeannett Senior, MD  rosuvastatin (CRESTOR) 40 MG tablet Take 1 tablet (40 mg total) by mouth daily. 06/04/22 12/24/22  [place], Eric J, DO      Allergies    Tomato, Beef-derived products, Hydrocodone, and Latex    Review of Systems   Review of Systems  Physical Exam Updated Vital Signs BP (!) 154/90 (BP Location: Right Arm)   Pulse 69   Temp 98 F (36.7 C) (Oral)   Resp 18   Ht 5\' 3"  (1.6 m)   Wt 80.7 kg   LMP 12/07/2022 (Approximate)   SpO2 100%   BMI 31.53 kg/m  Physical Exam Vitals and nursing note reviewed.  Constitutional:      General: She is not in acute distress.    Appearance: She is well-developed. She is not diaphoretic.  HENT:     Head: Normocephalic and atraumatic.  Eyes:     Pupils: Pupils are equal, round, and reactive to light.  Cardiovascular:     Rate and Rhythm: Normal rate and regular rhythm.     Heart sounds: No murmur heard.    No friction rub. No gallop.  Pulmonary:     Effort: Pulmonary effort is normal.  Breath sounds: No wheezing or rales.  Abdominal:     General: There is no distension.     Palpations: Abdomen is soft.     Tenderness: There is no abdominal tenderness.  Musculoskeletal:        General: No tenderness.     Cervical back: Normal range of motion and neck supple.     Comments: Patient is unwilling to move her legs.  No obvious hyperreflexia.  Otherwise benign exam.  Skin:    General: Skin is warm and dry.  Neurological:     Mental Status: She is alert and oriented to person, place, and time.  Psychiatric:        Behavior: Behavior normal.     ED Results / Procedures / Treatments   Labs (all labs ordered are listed, but only abnormal results are displayed) Labs Reviewed  CBC WITH DIFFERENTIAL/PLATELET - Abnormal; Notable for the following components:      Result Value   RBC 3.66 (*)    Hemoglobin 8.2 (*)     HCT 28.0 (*)    MCV 76.5 (*)    MCH 22.4 (*)    MCHC 29.3 (*)    RDW 21.2 (*)    All other components within normal limits  BASIC METABOLIC PANEL - Abnormal; Notable for the following components:   Potassium 3.3 (*)    Glucose, Bld 182 (*)    Calcium 8.4 (*)    All other components within normal limits  CBG MONITORING, ED - Abnormal; Notable for the following components:   Glucose-Capillary 152 (*)    All other components within normal limits    EKG None  Radiology No results found.  Procedures Procedures    Medications Ordered in ED Medications - No data to display  ED Course/ Medical Decision Making/ A&P                             Medical Decision Making Amount and/or Complexity of Data Reviewed Labs: ordered.   47 yo F well-known to this emergency department with 18 visits in the past 6 months.  She comes in with a chief complaints of slowed speech and slowed ambulation.  Her story does not make sense with what she is presenting with as far as her exam.  She tells me that she is unable to walk at all but told me that she was walking earlier just slower than normal.  I discussed the presentation with neurology.  I have personally seen her more than once in her recent visits.  At this point do not feel that she benefit from repeat imaging.  Encouraged outpatient follow-up.  The patient told me that she is not able to make it to her outpatient neurology appointments.  Will consult transitions of care.  Anemia at baseline, no significant electrolyte abnormalities.  Will have the patient follow-up with her neurologist as an outpatient.  1:44 PM:  I have discussed the diagnosis/risks/treatment options with the patient.  Evaluation and diagnostic testing in the emergency department does not suggest an emergent condition requiring admission or immediate intervention beyond what has been performed at this time.  They will follow up with Neuro. We also discussed returning to the  ED immediately if new or worsening sx occur. We discussed the sx which are most concerning (e.g., sudden worsening pain, fever, inability to tolerate by mouth) that necessitate immediate return. Medications administered to the patient during their visit and  any new prescriptions provided to the patient are listed below.  Medications given during this visit Medications - No data to display   The patient appears reasonably screen and/or stabilized for discharge and I doubt any other medical condition or other [facility] requiring further screening, evaluation, or treatment in the ED at this time prior to discharge.          Final Clinical Impression(s) / ED Diagnoses Final diagnoses:  Fatigue, unspecified type    Rx / DC Orders ED Discharge Orders     None         Melene Plan, DO 01/01/23 1352
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
## 2023-01-03 ENCOUNTER — Emergency Department (HOSPITAL_COMMUNITY)
Admission: EM | Admit: 2023-01-03 | Discharge: 2023-01-03 | Discharge status: Against Medical Advice | Payer: MEDICAID | Attending: Emergency Medicine | Admitting: Emergency Medicine

## 2023-01-03 ENCOUNTER — Emergency Department (HOSPITAL_COMMUNITY): Payer: MEDICAID

## 2023-01-03 ENCOUNTER — Other Ambulatory Visit: Payer: Self-pay

## 2023-01-03 ENCOUNTER — Emergency Department (HOSPITAL_COMMUNITY)
Admission: EM | Admit: 2023-01-03 | Discharge: 2023-01-03 | Disposition: A | Discharge status: Home / Self Care | Payer: MEDICAID | Attending: Emergency Medicine | Admitting: Emergency Medicine

## 2023-01-03 ENCOUNTER — Encounter (HOSPITAL_COMMUNITY): Payer: Self-pay

## 2023-01-03 DIAGNOSIS — R45851 Suicidal ideations: Secondary | ICD-10-CM | POA: Insufficient documentation

## 2023-01-03 DIAGNOSIS — F1990 Other psychoactive substance use, unspecified, uncomplicated: Secondary | ICD-10-CM | POA: Insufficient documentation

## 2023-01-03 DIAGNOSIS — Z5321 Procedure and treatment not carried out due to patient leaving prior to being seen by health care provider: Secondary | ICD-10-CM | POA: Insufficient documentation

## 2023-01-03 DIAGNOSIS — Z7982 Long term (current) use of aspirin: Secondary | ICD-10-CM | POA: Insufficient documentation

## 2023-01-03 DIAGNOSIS — R4781 Slurred speech: Secondary | ICD-10-CM | POA: Insufficient documentation

## 2023-01-03 DIAGNOSIS — R440 Auditory hallucinations: Secondary | ICD-10-CM | POA: Insufficient documentation

## 2023-01-03 DIAGNOSIS — Z91040 Latex allergy status: Secondary | ICD-10-CM | POA: Diagnosis not present

## 2023-01-03 LAB — COMPREHENSIVE METABOLIC PANEL
ALT: 16 U/L (ref 0–44)
AST: 14 U/L — ABNORMAL LOW (ref 15–41)
Albumin: 3.5 g/dL (ref 3.5–5.0)
Alkaline Phosphatase: 60 U/L (ref 38–126)
Anion gap: 17 — ABNORMAL HIGH (ref 5–15)
BUN: 11 mg/dL (ref 6–20)
CO2: 22 mmol/L (ref 22–32)
Calcium: 9.3 mg/dL (ref 8.9–10.3)
Chloride: 101 mmol/L (ref 98–111)
Creatinine, Ser: 0.84 mg/dL (ref 0.44–1.00)
GFR, Estimated: >60 mL/min (ref >60)
Glucose, Bld: 123 mg/dL — ABNORMAL HIGH (ref 70–99)
Potassium: 3.3 mmol/L — ABNORMAL LOW (ref 3.5–5.1)
Sodium: 140 mmol/L (ref 135–145)
Total Bilirubin: 0.1 mg/dL — ABNORMAL LOW (ref 0.3–1.2)
Total Protein: 7.7 g/dL (ref 6.5–8.1)

## 2023-01-03 LAB — CBC
HCT: 31.7 % — ABNORMAL LOW (ref 36.0–46.0)
Hemoglobin: 9.2 g/dL — ABNORMAL LOW (ref 12.0–15.0)
MCH: 22.6 pg — ABNORMAL LOW (ref 26.0–34.0)
MCHC: 29 g/dL — ABNORMAL LOW (ref 30.0–36.0)
MCV: 77.9 fL — ABNORMAL LOW (ref 80.0–100.0)
Platelets: 367 10*3/uL (ref 150–400)
RBC: 4.07 MIL/uL (ref 3.87–5.11)
RDW: 21.5 % — ABNORMAL HIGH (ref 11.5–15.5)
WBC: 5.4 10*3/uL (ref 4.0–10.5)
nRBC: 0 % (ref 0.0–0.2)

## 2023-01-03 LAB — SALICYLATE LEVEL: Salicylate Lvl: <7 mg/dL — ABNORMAL LOW (ref 7.0–30.0)

## 2023-01-03 LAB — ACETAMINOPHEN LEVEL: Acetaminophen (Tylenol), Serum: <10 ug/mL — ABNORMAL LOW (ref 10–30)

## 2023-01-03 LAB — ETHANOL: Alcohol, Ethyl (B): <10 mg/dL (ref <10)

## 2023-01-03 LAB — HCG, SERUM, QUALITATIVE: Preg, Serum: NEGATIVE

## 2023-01-03 LAB — TROPONIN I (HIGH SENSITIVITY): Troponin I (High Sensitivity): 5 ng/L (ref <18)

## 2023-01-03 MED ORDER — AMLODIPINE BESYLATE 5 MG PO TABS: 10.0000 mg | ORAL_TABLET | Freq: Once | ORAL | Status: DC

## 2023-01-03 MED ORDER — LOSARTAN POTASSIUM 50 MG PO TABS: 100.0000 mg | ORAL_TABLET | Freq: Once | ORAL | Status: DC

## 2023-01-03 NOTE — ED Notes (Signed)
Helped  patient to the restroom helped her in her bed but she refused for me to put the side rail up on the bed

## 2023-01-03 NOTE — ED Notes (Signed)
Patient ambulating just fine and found wandering in purple zone and again wanting to leave and requesting taxi-escorted to lobby

## 2023-01-03 NOTE — ED Notes (Addendum)
Patient just discharged and refused to leave, had to be escorted out by security. Immediately checked back in because she did not want to leave

## 2023-01-03 NOTE — ED Notes (Signed)
Patient ambulating in hallways trying to get someone to give her more food/. Patient has eaten sack lunch and soda and grahm crackers

## 2023-01-03 NOTE — Discharge Instructions (Addendum)
There is nothing to indicate that you had another stroke.  You have been worked up multiple times for this and have not been shown to have a new stroke.  Your symptoms are because of the old stroke that you had in the past.

## 2023-01-03 NOTE — ED Provider Notes (Deleted)
Before had a chance to see or evaluate this patient was informed by staff that she eloped.  Due to concern for suicidal ideation and active hallucination based on her triage complaint we will reach out to the authorities to try to track this patient down as I do think that she is likely to need psychiatric evaluation for decompensated schizophrenia, suicidal ideation at this time.  Staff attempted to stop patient from leaving the department and were unable to restrain her. She is neither medically nor psychiatrically cleared at this time for this acute presentation.  BP of 230/105 on arrival concerning for hypertensive urgency.  Staff were unable to stop this patient as she eloped from the department.  On chart review she has had multiple recent evaluations and frequent history of combative behavior, she is welcome to return for further evaluation and management if she chooses to return.     Olene Floss, [place] 01/03/23 [phone]

## 2023-01-03 NOTE — ED Notes (Signed)
Patient watching tik [employer] on phone and eating grahm crackers

## 2023-01-03 NOTE — ED Triage Notes (Addendum)
Pt. Stated, the voices say to lay down on the train track to kill myself. I see them on the  wall and keep calling me ugly cause Im not laying on the train track. Pt not taken medication in a couple days. Pt continues to talk to the voices telling her to go to train tracks.

## 2023-01-03 NOTE — ED Provider Notes (Signed)
Before fully evaluating this patient, we were informed by staff that she eloped from the department.  Due to the concerning triage note I attempted to evaluate the patient as she was attempting to leave from the waiting room.  She states that she would like to leave and does not want to be seated in the hallway anymore.  She states she will only stay if she can be roomed in her room and have different food.  I informed her that it would be better for her to be evaluated by psychiatry team.  She states that she does not need that.  I chart reviewed on her and she was just discharged a few moments ago this morning and had to be escorted out by security.  She immediately checked back into the emergency department because she did not want to leave the department.  Chart review demonstrates frequent visits to the emergency department up to every 1-2 days. She was evaluated by psychiatry 10 days ago w/ a history of chronic passive SI in context of polysubstance use, frequent ED visits with no self harm. She is able to engage in safety planning and again is instructed to go to [facility] if she feels unable to maintain her own safety. She agrees with that, still states she would like to leave the department. Instructed to return to the ED with return precautions.   Loetta Rough, MD 01/03/23 [phone]

## 2023-01-03 NOTE — ED Notes (Signed)
Patient arrived via ems from home stating I am having a stroke . Patient demanding cookies and milk [place] negative NIH score zero patient ambulatory to rr with walker A/o x 4 respirations even and non labored incontinent cleaned and given new brief
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
## 2023-01-03 NOTE — ED Notes (Signed)
Per NT Luisa Hart, pt instant on leaving, walked out to lobby; EDP notified

## 2023-01-03 NOTE — ED Provider Notes (Signed)
[place] EMERGENCY DEPARTMENT AT [facility] Provider Note   CSN: [id number] Arrival date & time: 01/03/23  0259     History  Chief Complaint  Patient presents with   Cerebrovascular Accident    Patient arrived via ems from home stating I am having a stroke per ems last known well 8 am 01/02/23 [place] negative    Kaitlyn Gray is a 47 y.o. female.  Presents to the emergency department from home by ambulance.  Patient reports that she is concerned that she might of had another stroke.  She reports that she feels like her speech is slurred more than usual.       Home Medications Prior to Admission medications   Medication Sig Start Date End Date Taking? Authorizing Provider  amLODipine (NORVASC) 10 MG tablet Take 1 tablet (10 mg total) by mouth daily. 06/04/22 12/24/22  [place], Alvira Philips, DO  ARIPiprazole ER (ABILIFY MAINTENA) 400 MG SRER injection Inject 2 mLs (400 mg total) into the muscle every 28 (twenty-eight) days. 12/23/22   Rankin, Shuvon B, NP  aspirin EC 81 MG tablet Take 81 mg by mouth daily. Swallow whole.    [provider]  ferrous sulfate 325 (65 FE) MG tablet Take 1 tablet (325 mg total) by mouth every other day. 06/04/22 12/24/22  [place], Alvira Philips, DO  gabapentin (NEURONTIN) 100 MG capsule Take 1 capsule (100 mg total) by mouth 3 (three) times daily for pain scale 4-7. 12/01/22     losartan (COZAAR) 100 MG tablet Take 1 tablet (100 mg total) by mouth daily. 06/04/22 12/24/22  [place], Alvira Philips, DO  metFORMIN (GLUCOPHAGE) 500 MG tablet Take 1 tablet (500 mg total) by mouth 2 (two) times daily with a meal. 06/04/22 12/24/22  [place], Alvira Philips, DO  nicotine polacrilex (NICORETTE) 2 MG gum Take 1 each (2 mg total) by mouth every 2 (two) hours as needed for Smoking Cravings Patient taking differently: Take 2 mg by mouth every 2 (two) hours as needed for smoking cessation. 12/01/22     pantoprazole (PROTONIX) 40 MG tablet Take 1 tablet (40 mg total) by mouth  daily. 06/04/22 12/24/22  [place], Alvira Philips, DO  potassium chloride SA (KLOR-CON M) 20 MEQ tablet Take 1 tablet (20 mEq total) by mouth 2 (two) times daily. 12/22/22   Rancour, Jeannett Senior, MD  rosuvastatin (CRESTOR) 40 MG tablet Take 1 tablet (40 mg total) by mouth daily. 06/04/22 12/24/22  [place], Eric J, DO      Allergies    Tomato, Beef-derived products, Hydrocodone, and Latex    Review of Systems   Review of Systems  Physical Exam Updated Vital Signs BP (!) 168/88 (BP Location: Right Arm)   Pulse 72   Temp 98.3 F (36.8 C) (Oral)   Resp 15   Ht 5\' 3"  (1.6 m)   Wt 81 kg   LMP 12/07/2022 (Approximate)   SpO2 98%   BMI 31.63 kg/m  Physical Exam Vitals and nursing note reviewed.  Constitutional:      General: She is not in acute distress.    Appearance: She is well-developed.  HENT:     Head: Normocephalic and atraumatic.     Mouth/Throat:     Mouth: Mucous membranes are moist.  Eyes:     General: Vision grossly intact. Gaze aligned appropriately.     Extraocular Movements: Extraocular movements intact.     Conjunctiva/sclera: Conjunctivae normal.  Cardiovascular:     Rate and Rhythm: Normal rate and regular  rhythm.     Pulses: Normal pulses.     Heart sounds: Normal heart sounds, S1 normal and S2 normal. No murmur heard.    No friction rub. No gallop.  Pulmonary:     Effort: Pulmonary effort is normal. No respiratory distress.     Breath sounds: Normal breath sounds.  Abdominal:     General: Bowel sounds are normal.     Palpations: Abdomen is soft.     Tenderness: There is no abdominal tenderness. There is no guarding or rebound.     Hernia: No hernia is present.  Musculoskeletal:        General: No swelling.     Cervical back: Full passive range of motion without pain, normal range of motion and neck supple. No spinous process tenderness or muscular tenderness. Normal range of motion.     Right lower leg: No edema.     Left lower leg: No edema.  Skin:    General:  Skin is warm and dry.     Capillary Refill: Capillary refill takes less than 2 seconds.     Findings: No ecchymosis, erythema, rash or wound.  Neurological:     General: No focal deficit present.     Mental Status: She is alert and oriented to person, place, and time.     GCS: GCS eye subscore is 4. GCS verbal subscore is 5. GCS motor subscore is 6.     Cranial Nerves: Cranial nerves 2-12 are intact.     Sensory: Sensation is intact.     Motor: Motor function is intact.     Coordination: Coordination is intact.  Psychiatric:        Attention and Perception: Attention normal.        Mood and Affect: Mood normal.        Speech: Speech normal.        Behavior: Behavior normal.     ED Results / Procedures / Treatments   Labs (all labs ordered are listed, but only abnormal results are displayed) Labs Reviewed - No data to display  EKG None  Radiology No results found.  Procedures Procedures    Medications Ordered in ED Medications - No data to display  ED Course/ Medical Decision Making/ A&P                             Medical Decision Making  Patient returns to the ED stating that she might of had a stroke.  She was seen 2 days ago with similar symptoms.  Patient does not have any focal deficits on exam.  She is watching TikTok on her phone, will not participate in the exam.  Vital signs unremarkable.  Reviewing her records reveals that her strokes are felt to be multifactorial, mostly secondary to cocaine abuse.  Patient seems to have had many visits this year to the ED with similar complaints.  She has been worked up several times including MRI and has not been shown to have any new strokes.  There is nothing obviously focal on her exam other than the historical left hemiparesis and speech difficulty which is well-documented.  I do not feel that the patient requires another MRI as she has had 3 since February of this year, most recently June 22 with similar  complaints.        Final Clinical Impression(s) / ED Diagnoses Final diagnoses:  Slurred speech    Rx / DC Orders  ED Discharge Orders     None         Keylin Podolsky, Canary Brim, MD 01/03/23 [phone]

## 2023-01-04 ENCOUNTER — Emergency Department (HOSPITAL_COMMUNITY)
Admission: EM | Admit: 2023-01-04 | Discharge: 2023-01-04 | Disposition: A | Discharge status: Home / Self Care | Payer: MEDICAID | Attending: Emergency Medicine | Admitting: Emergency Medicine

## 2023-01-04 ENCOUNTER — Other Ambulatory Visit: Payer: Self-pay

## 2023-01-04 ENCOUNTER — Emergency Department (HOSPITAL_COMMUNITY): Payer: MEDICAID

## 2023-01-04 DIAGNOSIS — F4324 Adjustment disorder with disturbance of conduct: Secondary | ICD-10-CM | POA: Diagnosis present

## 2023-01-04 DIAGNOSIS — Z7984 Long term (current) use of oral hypoglycemic drugs: Secondary | ICD-10-CM | POA: Diagnosis not present

## 2023-01-04 DIAGNOSIS — W1830XA Fall on same level, unspecified, initial encounter: Secondary | ICD-10-CM | POA: Insufficient documentation

## 2023-01-04 DIAGNOSIS — R45851 Suicidal ideations: Secondary | ICD-10-CM

## 2023-01-04 DIAGNOSIS — Z79899 Other long term (current) drug therapy: Secondary | ICD-10-CM | POA: Insufficient documentation

## 2023-01-04 DIAGNOSIS — Z23 Encounter for immunization: Secondary | ICD-10-CM | POA: Insufficient documentation

## 2023-01-04 DIAGNOSIS — Z7982 Long term (current) use of aspirin: Secondary | ICD-10-CM | POA: Diagnosis not present

## 2023-01-04 DIAGNOSIS — E119 Type 2 diabetes mellitus without complications: Secondary | ICD-10-CM | POA: Diagnosis not present

## 2023-01-04 DIAGNOSIS — M25572 Pain in left ankle and joints of left foot: Secondary | ICD-10-CM | POA: Insufficient documentation

## 2023-01-04 DIAGNOSIS — Z91040 Latex allergy status: Secondary | ICD-10-CM | POA: Diagnosis not present

## 2023-01-04 DIAGNOSIS — I10 Essential (primary) hypertension: Secondary | ICD-10-CM | POA: Insufficient documentation

## 2023-01-04 DIAGNOSIS — W19XXXA Unspecified fall, initial encounter: Secondary | ICD-10-CM

## 2023-01-04 DIAGNOSIS — Y9301 Activity, walking, marching and hiking: Secondary | ICD-10-CM | POA: Insufficient documentation

## 2023-01-04 DIAGNOSIS — R448 Other symptoms and signs involving general sensations and perceptions: Secondary | ICD-10-CM | POA: Diagnosis not present

## 2023-01-04 DIAGNOSIS — Z8673 Personal history of transient ischemic attack (TIA), and cerebral infarction without residual deficits: Secondary | ICD-10-CM | POA: Diagnosis not present

## 2023-01-04 DIAGNOSIS — R531 Weakness: Secondary | ICD-10-CM | POA: Diagnosis not present

## 2023-01-04 LAB — CBC WITH DIFFERENTIAL/PLATELET
Abs Immature Granulocytes: 0.01 10*3/uL (ref 0.00–0.07)
Basophils Absolute: 0 10*3/uL (ref 0.0–0.1)
Basophils Relative: 1 %
Eosinophils Absolute: 0.1 10*3/uL (ref 0.0–0.5)
Eosinophils Relative: 3 %
HCT: 28.9 % — ABNORMAL LOW (ref 36.0–46.0)
Hemoglobin: 8.3 g/dL — ABNORMAL LOW (ref 12.0–15.0)
Immature Granulocytes: 0 %
Lymphocytes Relative: 32 %
Lymphs Abs: 1.3 10*3/uL (ref 0.7–4.0)
MCH: 22.4 pg — ABNORMAL LOW (ref 26.0–34.0)
MCHC: 28.7 g/dL — ABNORMAL LOW (ref 30.0–36.0)
MCV: 78.1 fL — ABNORMAL LOW (ref 80.0–100.0)
Monocytes Absolute: 0.3 10*3/uL (ref 0.1–1.0)
Monocytes Relative: 7 %
Neutro Abs: 2.3 10*3/uL (ref 1.7–7.7)
Neutrophils Relative %: 57 %
Platelets: 354 10*3/uL (ref 150–400)
RBC: 3.7 MIL/uL — ABNORMAL LOW (ref 3.87–5.11)
RDW: 21.3 % — ABNORMAL HIGH (ref 11.5–15.5)
WBC: 4 10*3/uL (ref 4.0–10.5)
nRBC: 0 % (ref 0.0–0.2)

## 2023-01-04 LAB — COMPREHENSIVE METABOLIC PANEL
ALT: 14 U/L (ref 0–44)
AST: 13 U/L — ABNORMAL LOW (ref 15–41)
Albumin: 3.3 g/dL — ABNORMAL LOW (ref 3.5–5.0)
Alkaline Phosphatase: 61 U/L (ref 38–126)
Anion gap: 6 (ref 5–15)
BUN: 14 mg/dL (ref 6–20)
CO2: 28 mmol/L (ref 22–32)
Calcium: 8.5 mg/dL — ABNORMAL LOW (ref 8.9–10.3)
Chloride: 106 mmol/L (ref 98–111)
Creatinine, Ser: 0.83 mg/dL (ref 0.44–1.00)
GFR, Estimated: >60 mL/min (ref >60)
Glucose, Bld: 177 mg/dL — ABNORMAL HIGH (ref 70–99)
Potassium: 3.8 mmol/L (ref 3.5–5.1)
Sodium: 140 mmol/L (ref 135–145)
Total Bilirubin: 0.5 mg/dL (ref 0.3–1.2)
Total Protein: 7.1 g/dL (ref 6.5–8.1)

## 2023-01-04 MED ORDER — ACETAMINOPHEN 325 MG PO TABS
650.0000 mg | ORAL_TABLET | Freq: Once | ORAL | Status: AC
  Administered 2023-01-04: 650 mg via ORAL
  Filled 2023-01-04: qty 2

## 2023-01-04 MED ORDER — TETANUS-DIPHTH-ACELL PERTUSSIS 5-2.5-18.5 LF-MCG/0.5 IM SUSY
0.5000 mL | PREFILLED_SYRINGE | Freq: Once | INTRAMUSCULAR | Status: AC
  Administered 2023-01-04: 0.5 mL via INTRAMUSCULAR
  Filled 2023-01-04: qty 0.5

## 2023-01-04 NOTE — ED Notes (Signed)
Pt left facility and went outside with belongings. At this time Dr Suezanne Jacquet was notified and pt was considered eloped. Atout 15 min later pt returned and is walking around department. Security is aware pt left on her own accord and is no longer a registered pt in our department. Waiting on primary nurse to complete charting in order to remove chart from system.

## 2023-01-04 NOTE — ED Provider Notes (Signed)
Kaitlyn Gray EMERGENCY DEPARTMENT AT [facility] Provider Note   CSN: [id number] Arrival date & time: 01/04/23  1049     History  Chief Complaint  Patient presents with   Kaitlyn Gray    Kaitlyn Gray is a 47 y.o. female.  Patient is a 47 year old female with a past medical history of CVA with left-sided deficits, hypertension, diabetes, bipolar disorder presenting to the emergency department after a fall.  The patient states that she was walking with her walker today when she lost her balance and fell onto her left side.  She states that she is having some pain in her left ankle.  She states that she does not believe that she hit her head and denies loss of consciousness.  She denies any lightheadedness or dizziness, chest pain or shortness of breath prior to the fall but has been feeling weak recently.  Of note she reports that she has been seen in the emergency department recently for her left-sided weakness and being off balance and was told that she has not had a new stroke.  She states that she has not seen her neurologist recently.  Denies any headaches, nausea, vomiting or blood thinner use.  The history is provided by the patient.  Fall       Home Medications Prior to Admission medications   Medication Sig Start Date End Date Taking? Authorizing Provider  amLODipine (NORVASC) 10 MG tablet Take 1 tablet (10 mg total) by mouth daily. 06/04/22 12/24/22  [place], Alvira Philips, DO  ARIPiprazole ER (ABILIFY MAINTENA) 400 MG SRER injection Inject 2 mLs (400 mg total) into the muscle every 28 (twenty-eight) days. 12/23/22   Rankin, Shuvon B, NP  aspirin EC 81 MG tablet Take 81 mg by mouth daily. Swallow whole.    [provider]  ferrous sulfate 325 (65 FE) MG tablet Take 1 tablet (325 mg total) by mouth every other day. 06/04/22 12/24/22  [place], Alvira Philips, DO  gabapentin (NEURONTIN) 100 MG capsule Take 1 capsule (100 mg total) by mouth 3 (three) times daily for pain scale 4-7.  12/01/22     losartan (COZAAR) 100 MG tablet Take 1 tablet (100 mg total) by mouth daily. 06/04/22 12/24/22  [place], Alvira Philips, DO  metFORMIN (GLUCOPHAGE) 500 MG tablet Take 1 tablet (500 mg total) by mouth 2 (two) times daily with a meal. 06/04/22 12/24/22  [place], Alvira Philips, DO  nicotine polacrilex (NICORETTE) 2 MG gum Take 1 each (2 mg total) by mouth every 2 (two) hours as needed for Smoking Cravings Patient taking differently: Take 2 mg by mouth every 2 (two) hours as needed for smoking cessation. 12/01/22     pantoprazole (PROTONIX) 40 MG tablet Take 1 tablet (40 mg total) by mouth daily. 06/04/22 12/24/22  [place], Alvira Philips, DO  potassium chloride SA (KLOR-CON M) 20 MEQ tablet Take 1 tablet (20 mEq total) by mouth 2 (two) times daily. 12/22/22   Rancour, Jeannett Senior, MD  rosuvastatin (CRESTOR) 40 MG tablet Take 1 tablet (40 mg total) by mouth daily. 06/04/22 12/24/22  [place], Eric J, DO      Allergies    Tomato, Beef-derived products, Hydrocodone, and Latex    Review of Systems   Review of Systems  Physical Exam Updated Vital Signs BP (!) 154/89 (BP Location: Right Arm)   Pulse 60   Temp 98.7 F (37.1 C) (Oral)   Resp 16   LMP 12/07/2022 (Approximate)   SpO2 98%  Physical Exam Vitals and nursing  note reviewed.  Constitutional:      General: She is not in acute distress.    Appearance: Normal appearance.  HENT:     Head: Normocephalic and atraumatic.     Nose: Nose normal.     Mouth/Throat:     Mouth: Mucous membranes are moist.     Pharynx: Oropharynx is clear.  Eyes:     Extraocular Movements: Extraocular movements intact.     Conjunctiva/sclera: Conjunctivae normal.     Pupils: Pupils are equal, round, and reactive to light.  Cardiovascular:     Rate and Rhythm: Normal rate and regular rhythm.     Heart sounds: Normal heart sounds.  Pulmonary:     Effort: Pulmonary effort is normal.     Breath sounds: Normal breath sounds.  Abdominal:     General: Abdomen is flat.      Palpations: Abdomen is soft.     Tenderness: There is no abdominal tenderness.  Musculoskeletal:        General: Normal range of motion.     Cervical back: Normal range of motion.     Comments: Mild tenderness to palpation of left ankle, no swelling, able to ambulate on ankle  Skin:    General: Skin is warm and dry.  Neurological:     Mental Status: She is alert and oriented to person, place, and time. Mental status is at baseline.     Cranial Nerves: No cranial nerve deficit.     Sensory: Sensory deficit (Subjectively decreased sensation on left side) present.     Motor: Weakness (5 out of 5 strength in right upper and lower extremity, 4-5 strength in left upper extremity, 3 out of 5 in left lower extremity) present.     Coordination: Coordination normal.  Psychiatric:        Mood and Affect: Mood normal.        Behavior: Behavior normal.     ED Results / Procedures / Treatments   Labs (all labs ordered are listed, but only abnormal results are displayed) Labs Reviewed  COMPREHENSIVE METABOLIC PANEL - Abnormal; Notable for the following components:      Result Value   Glucose, Bld 177 (*)    Calcium 8.5 (*)    Albumin 3.3 (*)    AST 13 (*)    All other components within normal limits  CBC WITH DIFFERENTIAL/PLATELET - Abnormal; Notable for the following components:   RBC 3.70 (*)    Hemoglobin 8.3 (*)    HCT 28.9 (*)    MCV 78.1 (*)    MCH 22.4 (*)    MCHC 28.7 (*)    RDW 21.3 (*)    All other components within normal limits  URINALYSIS, ROUTINE W REFLEX MICROSCOPIC  HCG, SERUM, QUALITATIVE    EKG EKG Interpretation Date/Time:  Sunday January 04 2023 14:06:17 EDT Ventricular Rate:  67 PR Interval:  151 QRS Duration:  72 QT Interval:  469 QTC Calculation: 496 R Axis:   57  Text Interpretation: Sinus rhythm Borderline prolonged QT interval Mildly prolonged QT otherwise unchanged from prior EKG Confirmed by Kaitlyn Gray ([id number]) on 01/04/2023 2:28:35  PM  Radiology DG Wrist Complete Left  Result Date: 01/04/2023 CLINICAL DATA:  Status post fall.  Hypertension. EXAM: LEFT WRIST - COMPLETE 3+ VIEW COMPARISON:  None Available. FINDINGS: No signs of acute fracture or dislocation. No significant arthropathy. Soft tissues are unremarkable. IMPRESSION: Negative. Electronically Signed   By: Signa Kell M.D.   On: 01/04/2023  13:28    Procedures Procedures    Medications Ordered in ED Medications  Tdap (BOOSTRIX) injection 0.5 mL (0.5 mLs Intramuscular Given 01/04/23 1335)  acetaminophen (TYLENOL) tablet 650 mg (650 mg Oral Given 01/04/23 1333)    ED Course/ Medical Decision Making/ A&P Clinical Course as of 01/04/23 1443  Sun Jan 04, 2023  1300 Patient did have a fall in the bathroom and reports she hit her head. No signs of head injury. Did hit wrist with small abrasion. Will have wrist XR performed. Low risk by Canadian head CT rule and does not need CTH. [VK]  1427 Labs within normal range/at baseline. XR without acute traumatic injury. [VK]  1440 Patient is medically cleared for discharge. When patient's nurse went to reassess prior to discharge the patient reports she is suicidal and would like to speak with psychiatry. No plan. TTS will be consulted. [VK]    Clinical Course User Index [VK] Rexford Maus, DO                             Medical Decision Making This patient presents to the ED with chief complaint(s) of fall, weakness with pertinent past medical history of CVA with L-sided deficits, HTN, DM, bipolar disorder which further complicates the presenting complaint. The complaint involves an extensive differential diagnosis and also carries with it a high risk of complications and morbidity.    The differential diagnosis includes patient is low risk by Canadian head CT rule making ICH or mass effect unlikely, no point bony tenderness to palpation and no obvious fracture or dislocation on exam, will have EKG and labs  performed to evaluate for cause of her worsening weakness and will be closely reassessed.  Additional history obtained: Additional history obtained from N/A Records reviewed prior ED records  ED Course and Reassessment: On patient's arrival she is hemodynamically stable in no acute distress.  She has no obvious injury on exam and is low risk by [place] head CT rule.  The patient will have EKG and labs performed to evaluate for cause of her symptoms and she will be closely reassessed.  Independent labs interpretation:  The following labs were independently interpreted: At baseline without acute abnormality  Independent visualization of imaging: - I independently visualized the following imaging with scope of interpretation limited to determining acute life threatening conditions related to emergency care: Left wrist x-ray, which revealed no acute traumatic injury  Consultation: - Consulted or discussed management/test interpretation w/ external professional: TTS   Social Determinants of health: n/a    Amount and/or Complexity of Data Reviewed Labs: ordered. Radiology: ordered.  Risk OTC drugs. Prescription drug management.          Final Clinical Impression(s) / ED Diagnoses Final diagnoses:  None    Rx / DC Orders ED Discharge Orders     None         Rexford Maus, DO 01/04/23 1448

## 2023-01-04 NOTE — ED Notes (Signed)
Pt refused to wait to be discharged because she doesn't want to miss her ride. I had her speak with the NP [facility].  Pt left shortly after the provider left stating that she does not want to miss her ride. She walked back in here to ask one of our patient's visitor for a lighter and walked back out

## 2023-01-04 NOTE — Discharge Summary (Signed)
[facility] ED Discharge  01/04/2023 5:27 PM Kaitlyn Gray  MRN:  [id number]  Principal Problem: Adjustment disorder with disturbance of conduct Discharge Diagnoses: Principal Problem:   Adjustment disorder with disturbance of conduct  Clinical Impression:  Final diagnoses:  Fall, initial encounter  Suicidal ideation   Subjective:  patient admitted with previous hx of Bipolar disorder, .Depression, adjustment disorder, Cannabis use disorder Polysubstance abuse, CVA with left sided weakness who came to the ER with c/o pain after falling down on her left side.   Of note this is her 9th ER visits to [facility] this month.  Patient who appears older than stated age, disheveled and unkempt came in to the ER with c/o pain to left side after unwitnessed fall.  Patient has a hx of Bipolar disorder and receives Abilify LAI monthly.  Last dose was two weeks ago.  Patient was asked the reason for frequenting ER settings.  She admitted that she is lonely alone at home.  Due to 4 strokes patient states that with left sided weakness she can no longer take care of herself.  Patient reports that she can no longer cook for herself because she cannot stand on one leg.  Patient states she is not eating so she comes to the ER frequently.  Patient states she does not have family to help her but then with 10 minutes provider left her room she called her niece and cousin.  Patient admits to smoking Marijuana twice or three times a week but would like to smoke daily because it helps her with her anxiety.  Patient reports her Seroquel was discontinued and since then she has not been sleeping.  Patient listed hospitals she would like to be admitted but says not to send her to [facility] in [place].  Patient begged provider to admit her to [facility] or [place].  Patient was advised that she cannot chose hospital as she wants.  We admitted if a patient meets criteria to any facility with available bed. Patient called  provider back and stated that she lied about hearing voices because she is lonely at home.  She also states she need to be placed at ALF or SNF because she cannot care for herself.  She was given all the needed resources in the community two days ago at [facility].  Patient vehemently denied hearing voices of seeing people on the wall.  Patient want to go home stating she just finished eating dinner.  We reviewed safet plan for her to go to [facility] facility for mental health care.  Call 911 or [phone] for mental health crisis.  Patient verbalizes understanding.  Patient is Psychiatrically cleared.  ED Assessment Time Calculation: Start Time: 1654 Stop Time: 1720 Total Time in Minutes (Assessment Completion): 26   Past Psychiatric History: f Bipolar disorder, .Depression, adjustment disorder, Cannabis use disorder Polysubstance abuse.  Patient was hospitalized at [facility] in [place] last week.  She has been hospitalized at [facility] before .  Patient received Abilify LAI two weeks ago she says.  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) 07/19/2017   Hypertension    Hypertensive emergency 05/31/2022   Ischemic stroke (HCC) 01/01/2020   subacute right middle cerebellar peduncle and pons infarction   Left-sided weakness 01/27/2022   Head CT with remote right occipital infarct which is consistent with old left-sided weakness   MDD (major  depressive disorder), recurrent severe, 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   Normocytic anemia 02/09/2020   Opiate use    History of Suboxone Therapy until 05/2021   Polysubstance abuse (HCC) 07/19/2017   Prescription drug misadventures (Seroquel)     Past Surgical History:  Procedure Laterality Date   CESAREAN SECTION     Family History:  Family History  Problem Relation Age of Onset   Hypertension Mother    CAD Mother 59       died of MI at age 58   Hypertension Father    Family Psychiatric  History: denies Social History:  Social History   Substance and Sexual Activity  Alcohol Use Not Currently     Social History   Substance and Sexual Activity  Drug Use Yes   Types: Marijuana   Comment: occassionally    Social History   Socioeconomic History   Marital status: Single    Spouse name: Not on file   Number of children: Not on file   Years of education: Not on file   Highest education level: Not on file  Occupational History   Not on file  Tobacco Use   Smoking status: Former    Current packs/day: 0.50    Types: Cigarettes    Passive exposure: Current   Smokeless tobacco: Former  Building services engineer status: Never Used  Substance and Sexual Activity   Alcohol use: Not Currently   Drug use: Yes    Types: Marijuana    Comment: occassionally   Sexual activity: Not on file  Other Topics Concern   Not on file  Social History Narrative   ** Merged History Encounter **       Social Determinants of Health   Financial Resource Strain: Not on file  Food Insecurity: Food Insecurity Present (12/23/2022)   Hunger Vital Sign    Worried About Running Out of Food in the Last Year: Sometimes true    Ran Out of Food in the Last Year: Sometimes true  Transportation Needs: No Transportation Needs (12/23/2022)   PRAPARE - Administrator, Civil Service (Medical): No    Lack of Transportation (Non-Medical): No  Physical Activity: Not on file  Stress: Not on  file  Social Connections: Not on file    Tobacco Cessation:  N/A, patient does not currently use tobacco products  Current Medications: No current facility-administered medications for this encounter.   Current Outpatient Medications  Medication Sig Dispense Refill   amLODipine (NORVASC) 10 MG tablet Take 1 tablet (10 mg total) by mouth daily. 30 tablet 2   ARIPiprazole ER (ABILIFY MAINTENA) 400 MG SRER injection Inject 2 mLs (400 mg total) into the muscle every 28 (twenty-eight) days. 1 each 0   aspirin EC 81 MG tablet Take 81 mg by mouth daily. Swallow whole.     ferrous sulfate 325 (65 FE) MG tablet Take 1 tablet (325 mg total) by mouth every other day. 15 tablet 2   gabapentin (NEURONTIN) 100 MG capsule  Take 1 capsule (100 mg total) by mouth 3 (three) times daily for pain scale 4-7. 45 capsule 1   losartan (COZAAR) 100 MG tablet Take 1 tablet (100 mg total) by mouth daily. 30 tablet 2   metFORMIN (GLUCOPHAGE) 500 MG tablet Take 1 tablet (500 mg total) by mouth 2 (two) times daily with a meal. 60 tablet 2   nicotine polacrilex (NICORETTE) 2 MG gum Take 1 each (2 mg total) by mouth every 2 (two) hours as needed for Smoking Cravings (Patient taking differently: Take 2 mg by mouth every 2 (two) hours as needed for smoking cessation.) 180 each 1   pantoprazole (PROTONIX) 40 MG tablet Take 1 tablet (40 mg total) by mouth daily. 30 tablet 2   potassium chloride SA (KLOR-CON M) 20 MEQ tablet Take 1 tablet (20 mEq total) by mouth 2 (two) times daily. 6 tablet 0   rosuvastatin (CRESTOR) 40 MG tablet Take 1 tablet (40 mg total) by mouth daily. 30 tablet 2   PTA Medications: (Not in a hospital admission)   [place] Scale:  Flowsheet Row ED from 01/04/2023 in [facility] Emergency Department at [facility] Most recent reading at 01/04/2023 11:34 AM ED from 01/03/2023 in [facility] Emergency Department at [facility] Most recent reading at 01/03/2023  8:07 AM ED from 01/03/2023 in  [facility] Emergency Department at [facility] Most recent reading at 01/03/2023  3:33 AM  C-SSRS RISK CATEGORY Error: Q3, 4, or 5 should not be populated when Q2 is No Error: Q2 is Yes, you must answer 3, 4, and 5 No Risk       Musculoskeletal: Strength & Muscle Tone:  utilizes walker. Gait & Station:  utilizes walker, s/p stroke  Patient leans:  see above  Psychiatric Specialty Exam: Presentation  General Appearance:  Disheveled  Eye Contact: Good  Speech: Clear and Coherent; Normal Rate  Speech Volume: Normal  Handedness: Right   Mood and Affect  Mood: Irritable  Affect: Congruent   Thought Process  Thought Processes: Coherent; Goal Directed  Descriptions of Associations:Intact  Orientation:Full (Time, Place and Person)  Thought Content:Logical  History of Schizophrenia/Schizoaffective disorder:No  Duration of Psychotic Symptoms:Greater than six months  Hallucinations:Hallucinations: None  Ideas of Reference:None  Suicidal Thoughts:Suicidal Thoughts: No  Homicidal Thoughts:Homicidal Thoughts: No   Sensorium  Memory: Immediate Good; Remote Good; Recent Good  Judgment: Poor  Insight: Present   Executive Functions  Concentration: Good  Attention Span: Good  Recall: Good  Fund of Knowledge: Good  Language: Good   Psychomotor Activity  Psychomotor Activity: Psychomotor Activity: Normal   Assets  Assets: Communication Skills; Housing; Social Support; Desire for Improvement   Sleep  Sleep: Sleep: Fair    Physical Exam: Physical Exam Vitals (disheveled) and nursing note reviewed.  Constitutional:      Appearance: Normal appearance.  HENT:     Head: Normocephalic.     Nose: Nose normal.  Cardiovascular:     Rate and Rhythm: Normal rate.  Pulmonary:     Effort: Pulmonary effort is normal.  Musculoskeletal:        General: Normal range of motion.  Skin:    General: Skin is warm and dry.   Neurological:     General: No focal deficit present.     Mental Status: She is alert and oriented to person, place, and time.  Psychiatric:        Attention and Perception: Attention and perception normal.  Mood and Affect: Mood is depressed.        Speech: Speech normal.        Behavior: Behavior is cooperative.        Thought Content: Thought content normal.        Cognition and Memory: Cognition and memory normal.    Review of Systems  Constitutional:        Disheveled, states she cannot take care of self at home.  HENT: Negative.    Eyes: Negative.   Respiratory: Negative.    Cardiovascular: Negative.   Gastrointestinal: Negative.   Genitourinary: Negative.   Musculoskeletal:        Left side weakness, s/p CVA X 4.  Utilizes walker.  Skin: Negative.   Neurological: Negative.   Endo/Heme/Allergies: Negative.   Psychiatric/Behavioral:  Positive for depression.    Blood pressure (!) 154/89, pulse 60, temperature 98.7 F (37.1 C), temperature source Oral, resp. rate 16, last menstrual period 12/07/2022, SpO2 98%. There is no height or weight on file to calculate BMI.   Demographic Factors:  Adolescent or young adult, Low socioeconomic status, Living alone, and Unemployed  Loss Factors: Decline in physical health and s/p CVA, Utilizes walker  Historical Factors: Prior suicide attempts and Impulsivity  Risk Reduction Factors:   Religious beliefs about death and Positive social support  Continued Clinical Symptoms:  Bipolar Disorder:   Depressive phase Depression:   Impulsivity Insomnia Alcohol/Substance Abuse/Dependencies More than one psychiatric diagnosis  Cognitive Features That Contribute To Risk:  None    Suicide Risk:  Minimal: No identifiable suicidal ideation.  Patients presenting with no risk factors but with morbid ruminations; may be classified as minimal risk based on the severity of the depressive symptoms    Plan Of Care/Follow-up  recommendations:  Activity:  as tolerated Diet:  Regular  Medical Decision Making: Patient states she lied when she came to the ER initially.  She also stated she lied to this provider during our initial encounter.  Patient denies hearing voices or seeing anything.  She admitted that she comes to the ER because she feels lonely at home and with her stroke and using Walker she cannot move around as much as she would in the community.  She also admitted that she has been given resources available in the community including various transportation that can move her around.  She wanted to be placed in ALF/SNF but then she is worried that they will take all of her money.  Patient vehemently denies SI/HI/AVH.  She is Psychiatrically cleared.  Charge nurse states patient eloped before note is finished.  Problem 1: Adjustment disorder with disturbance of conduct.  Disposition: Psychiatrically cleared Earney Navy, NP-PMHNP-BC 01/04/2023, 5:27 PM

## 2023-01-04 NOTE — ED Triage Notes (Signed)
Pt came via [facility] from home. Paramedics state patients states she has a hx of stroke and she has been feeling weak for the past two days. She fell two days ago and was in the ED. She states that she takes plavix.  She was at [facility] 2 days ago and was insistent on not going back there because they did nothing for her.   HR 80 CBG 162 BP 148/85 O2 100%

## 2023-01-05 ENCOUNTER — Ambulatory Visit (HOSPITAL_COMMUNITY)
Admission: EM | Admit: 2023-01-05 | Discharge: 2023-01-05 | Disposition: A | Discharge status: Home / Self Care | Payer: MEDICAID

## 2023-01-05 ENCOUNTER — Emergency Department (HOSPITAL_COMMUNITY)
Admission: EM | Admit: 2023-01-05 | Discharge: 2023-01-05 | Disposition: A | Discharge status: Home / Self Care | Payer: MEDICAID | Attending: Emergency Medicine | Admitting: Emergency Medicine

## 2023-01-05 ENCOUNTER — Encounter (HOSPITAL_COMMUNITY): Payer: Self-pay

## 2023-01-05 ENCOUNTER — Other Ambulatory Visit: Payer: Self-pay

## 2023-01-05 DIAGNOSIS — Z87891 Personal history of nicotine dependence: Secondary | ICD-10-CM | POA: Diagnosis not present

## 2023-01-05 DIAGNOSIS — I10 Essential (primary) hypertension: Secondary | ICD-10-CM | POA: Diagnosis not present

## 2023-01-05 DIAGNOSIS — M545 Low back pain, unspecified: Secondary | ICD-10-CM | POA: Diagnosis not present

## 2023-01-05 DIAGNOSIS — W19XXXA Unspecified fall, initial encounter: Secondary | ICD-10-CM | POA: Diagnosis not present

## 2023-01-05 MED ORDER — ACETAMINOPHEN 500 MG PO TABS
1000.0000 mg | ORAL_TABLET | Freq: Once | ORAL | Status: AC
  Administered 2023-01-05: 1000 mg via ORAL
  Filled 2023-01-05: qty 2

## 2023-01-05 NOTE — ED Triage Notes (Signed)
Pt BIB EMS from home for a fall. Pt has no power and couldn't see her steps and ended up falling. Pt c/o leg pain, a headache, and back pain. Pt states it's a 10/10  168/96 98% RA 70 HR

## 2023-01-05 NOTE — Discharge Instructions (Signed)
You were evaluated in the Emergency Department and after careful evaluation, we did not find any emergent condition requiring admission or further testing in the hospital.  Your exam/testing today was overall reassuring.  Please return to the Emergency Department if you experience any worsening of your condition.  Thank you for allowing us to be a part of your care.  

## 2023-01-05 NOTE — ED Notes (Addendum)
Pt refused to leave after RN explained discharge instructions to her. Pt stated now she wants to talk to a psychiatrist even though she talked to one earlier. RN assessed the situation and pt stated she left earlier because she "didn't want to stay". RN educated pt on different outpatient options for psychiatric help. Pt started yelling at bedside and security was called. RN gave pt a bus pass while pt continued to curse out staff. Pt ambulated out of the hospital with a fast steady gait. Security escorted pt off property. MD notified.

## 2023-01-05 NOTE — ED Notes (Signed)
Patient states she doesn't want to be here - she doesn't like that we have no rooms, we have to take her stuff, left without being seen before triage.  Police taking her "somewhere else" per her request

## 2023-01-05 NOTE — ED Provider Notes (Addendum)
[facility]-EMERGENCY DEPT [facility] Emergency Department Provider Note MRN:  [id number]  Arrival date & time: 01/05/23     Chief Complaint   Fall   History of Present Illness   Kaitlyn Gray is a 47 y.o. year-old female with a history of bipolar disorder, stroke, substance use disorder presenting to the ED with chief complaint of fall.  Patient reports fall today.  Reports 10 out of 10 low back pain, thinks she hit her head as well.  Review of Systems  A thorough review of systems was obtained and all systems are negative except as noted in the HPI and PMH.   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) 07/19/2017   Hypertension    Hypertensive emergency 05/31/2022   Ischemic stroke (HCC) 01/01/2020   subacute right middle cerebellar peduncle and pons infarction   Left-sided weakness 01/27/2022   Head CT with remote right occipital infarct which is consistent with old left-sided weakness   MDD (major depressive disorder), recurrent severe, 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   Normocytic anemia 02/09/2020   Opiate use    History of Suboxone Therapy until 05/2021   Polysubstance abuse (HCC) 07/19/2017   Prescription drug misadventures (Seroquel)     Past Surgical History:  Procedure Laterality Date   CESAREAN SECTION      Family History  Problem Relation Age of Onset   Hypertension Mother    CAD Mother 53       died of MI at age  62   Hypertension Father     Social History   Socioeconomic History   Marital status: Single    Spouse name: Not on file   Number of children: Not on file   Years of education: Not on file   Highest education level: Not on file  Occupational History   Not on file  Tobacco Use   Smoking status: Former    Current packs/day: 0.50    Types: Cigarettes    Passive exposure: Current   Smokeless tobacco: Former  Building services engineer status: Never Used  Substance and Sexual Activity   Alcohol use: Not Currently   Drug use: Yes    Types: Marijuana    Comment: occassionally   Sexual activity: Not on file  Other Topics Concern   Not on file  Social History Narrative   ** Merged History Encounter **       Social Determinants of Health   Financial Resource Strain: Not on file  Food Insecurity: Food Insecurity Present (12/23/2022)   Hunger Vital Sign    Worried About Running Out of Food in the Last Year: Sometimes true    Ran Out of Food in the Last Year: Sometimes true  Transportation Needs: No Transportation Needs (12/23/2022)   PRAPARE - Administrator, Civil Service (Medical): No    Lack of Transportation (Non-Medical): No  Physical Activity: Not on file  Stress: Not on  file  Social Connections: Not on file  Intimate Partner Violence: Not At Risk (12/23/2022)   Humiliation, Afraid, Rape, and Kick questionnaire    Fear of Current or Ex-Partner: No    Emotionally Abused: No    Physically Abused: No    Sexually Abused: No     Physical Exam   Vitals:   01/05/23 0225  BP: (!) 149/84  Pulse: 67  Resp: 17  Temp: (!) 97.4 F (36.3 C)  SpO2: 100%    CONSTITUTIONAL: Chronically ill-appearing, NAD NEURO/PSYCH:  Alert and oriented x 3, chronic slurred speech EYES:  eyes equal and reactive ENT/NECK:  no LAD, no JVD CARDIO: Regular rate, well-perfused, normal S1 and S2 PULM:  CTAB no wheezing or rhonchi GI/GU:  non-distended, non-tender MSK/SPINE:  No gross  deformities, no edema SKIN:  no rash, atraumatic   *Additional and/or pertinent findings included in MDM below  Diagnostic and Interventional Summary    EKG Interpretation Date/Time:    Ventricular Rate:    PR Interval:    QRS Duration:    QT Interval:    QTC Calculation:   R Axis:      Text Interpretation:         Labs Reviewed - No data to display  No orders to display    Medications  acetaminophen (TYLENOL) tablet 1,000 mg (has no administration in time range)     Procedures  /  Critical Care Procedures  ED Course and Medical Decision Making  Initial Impression and Ddx This is patient's 13th ED visit in the past 22 days.  Highly suspect her visits are related to underlying psychiatric illness or social reasons.  She is complaining of 10 out of 10 back pain but she is ambulatory without issue, not even a grimace.  She has no evidence of head trauma on exam, she is requesting something stronger than Tylenol.  Past medical/surgical history that increases complexity of ED encounter: History of stroke  Interpretation of Diagnostics Laboratory and/or imaging options to aid in the diagnosis/care of the patient were considered.  After careful history and physical examination, it was determined that there was no indication for diagnostics at this time.  Patient Reassessment and Ultimate Disposition/Management     With no evidence of head trauma and an unchanged neurological exam there is no indication for CNS imaging.  With no midline back pain, no neurological deficits, normal ambulation there is no indication for imaging of her back at this time.  No emergent process, appropriate for discharge.  2:56 AM update: Upon learning the patient is being discharged and is not receiving narcotic pain medication she is now requesting psychiatric evaluation.  This is blatant malingering behavior, not indicated, appropriate for discharge.  Patient management required discussion with  the following services or consulting groups:  None  Complexity of Problems Addressed Acute complicated illness or Injury  Additional Data Reviewed and Analyzed Further history obtained from: None  Additional Factors Impacting ED Encounter Risk None  Elmer Sow. Pilar Plate, MD [facility] Emergency Medicine [facility] [email or web address]  Final Clinical Impressions(s) / ED Diagnoses     ICD-10-CM   1. Fall, initial encounter  [email or web address]       ED Discharge Orders     None        Discharge Instructions Discussed with and Provided to Patient:    Discharge Instructions      You were evaluated in the Emergency Department and after careful evaluation, we did not find any  emergent condition requiring admission or further testing in the hospital.  Your exam/testing today was overall reassuring.  Please return to the Emergency Department if you experience any worsening of your condition.  Thank you for allowing [place] to be a part of your care.       Sabas Sous, MD 01/05/23 2130    Sabas Sous, MD 01/05/23 [phone]

## 2023-01-07 ENCOUNTER — Emergency Department (HOSPITAL_COMMUNITY): Payer: MEDICAID

## 2023-01-07 ENCOUNTER — Other Ambulatory Visit: Payer: Self-pay

## 2023-01-07 ENCOUNTER — Emergency Department (HOSPITAL_COMMUNITY)
Admission: EM | Admit: 2023-01-07 | Discharge: 2023-01-07 | Disposition: A | Discharge status: Home / Self Care | Payer: MEDICAID | Attending: Student | Admitting: Student

## 2023-01-07 ENCOUNTER — Encounter (HOSPITAL_COMMUNITY): Payer: Self-pay

## 2023-01-07 DIAGNOSIS — Z7982 Long term (current) use of aspirin: Secondary | ICD-10-CM | POA: Insufficient documentation

## 2023-01-07 DIAGNOSIS — S0031XA Abrasion of nose, initial encounter: Secondary | ICD-10-CM | POA: Diagnosis not present

## 2023-01-07 DIAGNOSIS — S50812A Abrasion of left forearm, initial encounter: Secondary | ICD-10-CM | POA: Diagnosis not present

## 2023-01-07 DIAGNOSIS — S01112A Laceration without foreign body of left eyelid and periocular area, initial encounter: Secondary | ICD-10-CM | POA: Insufficient documentation

## 2023-01-07 DIAGNOSIS — S0083XA Contusion of other part of head, initial encounter: Secondary | ICD-10-CM | POA: Insufficient documentation

## 2023-01-07 DIAGNOSIS — S0993XA Unspecified injury of face, initial encounter: Secondary | ICD-10-CM | POA: Diagnosis present

## 2023-01-07 DIAGNOSIS — Z79899 Other long term (current) drug therapy: Secondary | ICD-10-CM | POA: Insufficient documentation
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
## 2023-01-07 NOTE — ED Notes (Signed)
Assumed care of patient at this time

## 2023-01-07 NOTE — Discharge Instructions (Addendum)
Ice pack to face for 20 minutes at a time with a thin cloth over face.  Follow up with your care team.

## 2023-01-07 NOTE — ED Notes (Signed)
Pt refused discharged vital signs. Pt refusing to leave at this time and screaming. Security at bedside.
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
## 2023-01-10 LAB — GLUCOSE, POCT (MANUAL RESULT ENTRY): Glucose Fasting, POC: 108 mg/dL — AB (ref 70–99)

## 2023-01-10 NOTE — Progress Notes (Unsigned)
Pt has pcp and sdoh resources provided.

## 2023-01-21 ENCOUNTER — Emergency Department (HOSPITAL_COMMUNITY): Payer: MEDICAID

## 2023-01-21 ENCOUNTER — Ambulatory Visit (HOSPITAL_COMMUNITY): Payer: MEDICAID

## 2023-01-21 ENCOUNTER — Encounter (HOSPITAL_COMMUNITY): Payer: Self-pay

## 2023-01-21 ENCOUNTER — Emergency Department (HOSPITAL_COMMUNITY)
Admission: EM | Admit: 2023-01-21 | Discharge: 2023-01-21 | Disposition: A | Discharge status: Home / Self Care | Payer: MEDICAID | Attending: Emergency Medicine | Admitting: Emergency Medicine

## 2023-01-21 ENCOUNTER — Other Ambulatory Visit: Payer: Self-pay

## 2023-01-21 DIAGNOSIS — I10 Essential (primary) hypertension: Secondary | ICD-10-CM

## 2023-01-21 DIAGNOSIS — R55 Syncope and collapse: Secondary | ICD-10-CM | POA: Diagnosis not present

## 2023-01-21 DIAGNOSIS — E876 Hypokalemia: Secondary | ICD-10-CM | POA: Diagnosis not present

## 2023-01-21 DIAGNOSIS — D649 Anemia, unspecified: Secondary | ICD-10-CM | POA: Diagnosis not present

## 2023-01-21 DIAGNOSIS — Z8673 Personal history of transient ischemic attack (TIA), and cerebral infarction without residual deficits: Secondary | ICD-10-CM

## 2023-01-21 DIAGNOSIS — G9341 Metabolic encephalopathy: Secondary | ICD-10-CM | POA: Diagnosis not present

## 2023-01-21 DIAGNOSIS — R202 Paresthesia of skin: Secondary | ICD-10-CM

## 2023-01-21 DIAGNOSIS — Z91040 Latex allergy status: Secondary | ICD-10-CM | POA: Insufficient documentation

## 2023-01-21 DIAGNOSIS — D508 Other iron deficiency anemias: Secondary | ICD-10-CM | POA: Diagnosis not present

## 2023-01-21 DIAGNOSIS — Z7982 Long term (current) use of aspirin: Secondary | ICD-10-CM | POA: Diagnosis not present

## 2023-01-21 DIAGNOSIS — Z79899 Other long term (current) drug therapy: Secondary | ICD-10-CM | POA: Diagnosis not present

## 2023-01-21 LAB — PROTIME-INR
INR: 1.1 (ref 0.8–1.2)
Prothrombin Time: 14.6 s (ref 11.4–15.2)

## 2023-01-21 LAB — I-STAT CHEM 8, ED
BUN: 10 mg/dL (ref 6–20)
Calcium, Ion: 1.09 mmol/L — ABNORMAL LOW (ref 1.15–1.40)
Chloride: 106 mmol/L (ref 98–111)
Creatinine, Ser: 0.9 mg/dL (ref 0.44–1.00)
Glucose, Bld: 127 mg/dL — ABNORMAL HIGH (ref 70–99)
HCT: 26 % — ABNORMAL LOW (ref 36.0–46.0)
Hemoglobin: 8.8 g/dL — ABNORMAL LOW (ref 12.0–15.0)
Potassium: 3.2 mmol/L — ABNORMAL LOW (ref 3.5–5.1)
Sodium: 139 mmol/L (ref 135–145)
TCO2: 24 mmol/L (ref 22–32)

## 2023-01-21 LAB — DIFFERENTIAL
Abs Immature Granulocytes: 0 10*3/uL (ref 0.00–0.07)
Basophils Absolute: 0 10*3/uL (ref 0.0–0.1)
Basophils Relative: 1 %
Eosinophils Absolute: 0.1 10*3/uL (ref 0.0–0.5)
Eosinophils Relative: 2 %
Immature Granulocytes: 0 %
Lymphocytes Relative: 35 %
Lymphs Abs: 1.7 10*3/uL (ref 0.7–4.0)
Monocytes Absolute: 0.4 10*3/uL (ref 0.1–1.0)
Monocytes Relative: 9 %
Neutro Abs: 2.6 10*3/uL (ref 1.7–7.7)
Neutrophils Relative %: 53 %

## 2023-01-21 LAB — COMPREHENSIVE METABOLIC PANEL
ALT: 12 U/L (ref 0–44)
AST: 13 U/L — ABNORMAL LOW (ref 15–41)
Albumin: 3.3 g/dL — ABNORMAL LOW (ref 3.5–5.0)
Alkaline Phosphatase: 54 U/L (ref 38–126)
Anion gap: 15 (ref 5–15)
BUN: 10 mg/dL (ref 6–20)
CO2: 22 mmol/L (ref 22–32)
Calcium: 9 mg/dL (ref 8.9–10.3)
Chloride: 101 mmol/L (ref 98–111)
Creatinine, Ser: 1.01 mg/dL — ABNORMAL HIGH (ref 0.44–1.00)
GFR, Estimated: >60 mL/min (ref >60)
Glucose, Bld: 125 mg/dL — ABNORMAL HIGH (ref 70–99)
Potassium: 3.1 mmol/L — ABNORMAL LOW (ref 3.5–5.1)
Sodium: 138 mmol/L (ref 135–145)
Total Bilirubin: 0.3 mg/dL (ref 0.3–1.2)
Total Protein: 6.8 g/dL (ref 6.5–8.1)

## 2023-01-21 LAB — APTT: aPTT: 29 seconds (ref 24–36)

## 2023-01-21 LAB — CBC
HCT: 26.8 % — ABNORMAL LOW (ref 36.0–46.0)
Hemoglobin: 7.9 g/dL — ABNORMAL LOW (ref 12.0–15.0)
MCH: 22.3 pg — ABNORMAL LOW (ref 26.0–34.0)
MCHC: 29.5 g/dL — ABNORMAL LOW (ref 30.0–36.0)
MCV: 75.5 fL — ABNORMAL LOW (ref 80.0–100.0)
Platelets: 304 K/uL (ref 150–400)
RBC: 3.55 MIL/uL — ABNORMAL LOW (ref 3.87–5.11)
RDW: 19.3 % — ABNORMAL HIGH (ref 11.5–15.5)
WBC: 4.7 K/uL (ref 4.0–10.5)
nRBC: 0 % (ref 0.0–0.2)

## 2023-01-21 LAB — ETHANOL: Alcohol, Ethyl (B): <10 mg/dL

## 2023-01-21 LAB — CBG MONITORING, ED: Glucose-Capillary: 130 mg/dL — ABNORMAL HIGH (ref 70–99)

## 2023-01-21 MED ORDER — SODIUM CHLORIDE 0.9% FLUSH
3.0000 mL | Freq: Once | INTRAVENOUS | Status: AC
  Administered 2023-01-21: 3 mL via INTRAVENOUS

## 2023-01-21 MED ORDER — FERROUS SULFATE 325 (65 FE) MG PO TABS: 325.0000 mg | ORAL_TABLET | Freq: Three times a day (TID) | ORAL | 0 refills | Status: DC

## 2023-01-21 MED ORDER — POTASSIUM CHLORIDE CRYS ER 20 MEQ PO TBCR: EXTENDED_RELEASE_TABLET | ORAL | 0 refills | Status: DC

## 2023-01-21 MED ORDER — POTASSIUM CHLORIDE CRYS ER 20 MEQ PO TBCR
40.0000 meq | EXTENDED_RELEASE_TABLET | Freq: Once | ORAL | Status: DC
  Filled 2023-01-21: qty 2

## 2023-01-21 NOTE — ED Notes (Signed)
Pt refusing vital signs

## 2023-01-21 NOTE — Consult Note (Addendum)
Neurology Consultation  Reason for Consult: Code Stroke Referring Physician: EDP  CC:  History is obtained from: EMS personnel  HPI: Kaitlyn Gray is a 47 y.o.African American female with a past medical history of cocaine use, HTN, DM2, bipolar disorder, and previous stroke presenting with right sided weakness. At baseline, the patient reportedly has left sided weakness and severe dysarthria. EMS was called today due to right sided weakness. Reportedly she woke up this morning and felt normal, but had a bystander call EMS when she felt like she was going to lose consciousness. Of note, the patient frequently comes to the ED and has had many CT scans in the past. Unable to obtain much history from the patient due to poor cooperation.   Hx stroke/pseudostroke: In 12/2019  MRI showed acute/subacute demyelination vs Subacute infarction R middle cerebral peduncle.  MRA showed Multifocal intracranial atherosclerotic disease vs vasculitis vs cocaine induced vasculopathy, including left ICA siphon moderate stenosis, left M2, right P2 and left P4 moderate to severe stenosis.  Carotid Doppler negative   02/09/2020 admitted for right pontine infarct. CT showed right middle cerebellar peduncle old infarct. MRI acute R pontine infarct with evolving right cerebellar peduncle ischemic abnormality. Put on DAPT x 3 weeks then asa alone.   08/2020 admitted for worsening weakness on the left.  Found to have extensive left-sided pain also.  MRI no acute infarct.  CT head and neck right ICA 50% stenosis, right P2 stenosis, multifocal intracranial stenosis.  LDL 102, A1c 7.3.  Patient continued on aspirin 81  03/2021 admitted again for confusion and slurred speech, with "baby voice".  MRI again no acute infarct.  10/2021 -ED visit for left-sided worsening weakness and left leg/foot pain.  CT head and neck no LVO and right ICA similar 50% stenosis.  MRI no acute infarct.  UDS positive for cocaine again.  Continued on  aspirin and Lipitor  01/2022 ED visit for left-sided weakness.  MRI negative for acute infarct.  03/2022 admitted for right leg weakness.  MRI showed scattered bilateral ACA infarcts.  CTA head and neck right ICA 70% stenosis and bilateral A2/A3 stenosis, moderate stenosis of right PCA P1 and P2 with occlusion of P3.  EF 70 to 75%.  DVT negative.  LDL 77, A1c 6.6, discharged on DAPT for 3 months and then Plavix alone.  Also Crestor 40.  Educated on smoking cessation and cocaine cessation  07/2022 admitted with diffuse weakness and new disconjugate gaze. CT no acute finding, CTA head and neck suboptimal due to bolus timing but no LVO. MRI done per wake up protocol found no acute infarct.   10/2022 and 12/2022 two ED visits consistent with conversion disorder.   The patient was not very cooperative upon arrival to the ED and unable to give complete history.    LKW: unknown TNK given?: No, not sure about time onset or LSW Mechanical Thrombectomy? No, no LVO sign Premorbid modified Rankin scale (mRS): 2 2-Slight disability-UNABLE to perform all activities but does not need assistance     ROS: Unable to obtain due to altered mental status.   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    Encounter for assessment of healthcare decision-making capacity    History of  cervical fracture 12/24/2017   nondisplaced fracture lateral mass of C1 on the right on CT 12/24/17   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) 07/19/2017   Hypertension    Hypertensive emergency 05/31/2022   Ischemic stroke (HCC) 01/01/2020   subacute right middle cerebellar peduncle and pons infarction   Left-sided weakness 01/27/2022    Head CT with remote right occipital infarct which is consistent with old left-sided weakness   MDD (major depressive disorder), recurrent severe, 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 Her smoking use included cigarettes. She has been exposed to tobacco smoke. She has quit using smokeless tobacco. She reports that she does not currently use alcohol. She reports current drug use. Drug: Marijuana.  Medications  Current Facility-Administered Medications:    sodium chloride flush (NS) 0.9 % injection 3 mL, 3 mL, Intravenous, Once, Cathren Laine, MD  Current Outpatient Medications:    amLODipine (NORVASC) 10 MG tablet, Take 1 tablet (10 mg total) by mouth daily., Disp: 30 tablet, Rfl: 2   ARIPiprazole ER (ABILIFY MAINTENA) 400 MG SRER injection, Inject 2 mLs (400 mg total) into the muscle every 28 (twenty-eight) days., Disp: 1 each, Rfl: 0   aspirin EC 81 MG tablet, Take 81 mg by mouth daily. Swallow whole., Disp: , Rfl:    ferrous sulfate 325 (65 FE) MG tablet, Take 1 tablet (325 mg total) by mouth every other day., Disp: 15 tablet, Rfl: 2   gabapentin (NEURONTIN) 100 MG capsule, Take 1 capsule (100 mg total) by mouth 3 (three) times daily for pain scale 4-7., Disp: 45 capsule, Rfl: 1   losartan (COZAAR) 100 MG tablet, Take 1 tablet (100 mg total) by mouth daily., Disp: 30 tablet, Rfl: 2   metFORMIN (GLUCOPHAGE) 500 MG tablet, Take 1 tablet (500 mg total) by mouth 2 (two) times daily with a meal., Disp: 60 tablet, Rfl: 2   nicotine polacrilex (NICORETTE) 2 MG gum, Take 1 each (2 mg total) by mouth every 2 (two) hours as  needed for Smoking Cravings (Patient taking differently: Take 2 mg by mouth every 2 (two) hours as needed for smoking cessation.), Disp: 180 each, Rfl: 1   pantoprazole (PROTONIX) 40 MG tablet, Take 1 tablet (40 mg total) by mouth daily., Disp: 30 tablet, Rfl: 2   potassium chloride SA (KLOR-CON M) 20 MEQ tablet, Take 1 tablet (20 mEq total) by mouth 2 (two) times daily., Disp: 6 tablet, Rfl: 0   rosuvastatin (CRESTOR) 40 MG tablet, Take 1 tablet (40 mg total) by mouth daily., Disp: 30 tablet, Rfl: 2  (Not in a hospital admission)     Exam: Current vital signs: Wt 80 kg   LMP 12/07/2022 (Approximate)   BMI 31.24 kg/m  Vital signs in last 24 hours: Weight:  [80 kg] 80 kg (08/07 1300)  GENERAL: Awakes to voice, somnolent HEENT: - Normocephalic and atraumatic, dry mm, no LN++, no Thyromegally LUNGS - Clear to auscultation bilaterally with no wheezes CV - S1S2 RRR, no m/r/g, equal  pulses bilaterally.  NEURO:  Mental Status: Oriented to self, place. Not oriented to time or situation. Follows simple commands, but inconsistently. Language: speech is dysarthric.   Unable to thoroughly assess CN II-XII. Chronic disconjugate gaze. Motor: able to lift right upper extremities against gravity. Does not lift bilateral lower extremities against gravity, legs drop to bed.  Tone: is normal and bulk is normal Sensation- Intact to noxious stimuli bilaterally Coordination: Unable to assess Gait- deferred  NIHSS 1a Level of Conscious.: 2 1b LOC Questions: 1 1c LOC Commands: 1 2 Best Gaze: 2 3 Visual: 3 4 Facial Palsy: 0 5a Motor Arm - left: 0 5b Motor Arm - Right:0  6a Motor Leg - Left: 4 6b Motor Leg - Right: 4 7 Limb Ataxia: 0 8 Sensory: 0 9 Best Language: 2 10 Dysarthria: 1 11 Extinct. and Inatten.: 0 TOTAL: 20   Labs I have reviewed labs in epic and the results pertinent to this consultation are: STAT Chem 8: K 3.2, Cr 1.09 Ethanol <10 CBC w hb 7.9  CBC    Component Value  Date/Time   WBC 4.0 01/04/2023 1341   RBC 3.70 (L) 01/04/2023 1341   HGB 8.8 (L) 01/21/2023 1301   HCT 26.0 (L) 01/21/2023 1301   PLT 354 01/04/2023 1341   MCV 78.1 (L) 01/04/2023 1341   MCH 22.4 (L) 01/04/2023 1341   MCHC 28.7 (L) 01/04/2023 1341   RDW 21.3 (H) 01/04/2023 1341   LYMPHSABS 1.3 01/04/2023 1341   MONOABS 0.3 01/04/2023 1341   EOSABS 0.1 01/04/2023 1341   BASOSABS 0.0 01/04/2023 1341    CMP     Component Value Date/Time   NA 139 01/21/2023 1301   K 3.2 (L) 01/21/2023 1301   CL 106 01/21/2023 1301   CO2 28 01/04/2023 1341   GLUCOSE 127 (H) 01/21/2023 1301   BUN 10 01/21/2023 1301   CREATININE 0.90 01/21/2023 1301   CALCIUM 8.5 (L) 01/04/2023 1341   PROT 7.1 01/04/2023 1341   ALBUMIN 3.3 (L) 01/04/2023 1341   ALBUMIN 3.7 (L) 01/03/2020 1122   AST 13 (L) 01/04/2023 1341   ALT 14 01/04/2023 1341   ALKPHOS 61 01/04/2023 1341   BILITOT 0.5 01/04/2023 1341   GFRNONAA >60 01/04/2023 1341   GFRAA >60 03/14/2020 1604    Lipid Panel     Component Value Date/Time   CHOL 166 12/23/2022 1538   TRIG 99 12/23/2022 1538   HDL 51 12/23/2022 1538   CHOLHDL 3.3 12/23/2022 1538   VLDL 20 12/23/2022 1538   LDLCALC 95 12/23/2022 1538     Imaging I have reviewed the images obtained:  CT-head: Stable noncontrast head CT with no acute intracranial pathology.   MRI examination of the brain: no acute infarction upon my review, but formal read pending.   Assessment:  47 y.o. female with previous stroke/pseudostroke presenting with right sided weakness with concern for acute stroke. Pt not cooperative with exam. No upper extremity weakness noted, but the patient does not cooperative with LE exam. CT and MRI showed no acute finding.    Recommendations: - HgbA1c, fasting lipid panel, UDS - Prophylactic therapy-Antiplatelets   Continue ASA 81mg   - Risk factor modification - Telemetry monitoring - PT consult, OT consult, Speech consult   Elza Rafter, DO Internal  Medicine Resident, PGY-3  ATTENDING NOTE: I reviewed above note and agree with the assessment and plan. Pt was seen and examined.   47 yo F frequent ED visitor with PMH of cocaine use,  HTN, DM2, bipolar disorder, and previous stroke/pseudostroke, seizure/pseudoseizure presenting to ED for code stroke. She stated that she was standing with her walker and felt about to pass out. She called someone who called EMS. On arrival, pt was initially walking, but then LOC, when come out of it, seems to have right sided weakness. Glucose 130. On exam, pt drowsy sleepy, not cooperative with exam. BUE lifting symmetrical. BLEs not against gravity. Severe dysarthria. CT and MRI no acute infarct.   Recommend observation overnight, IVF and PT/OT. Will check A1C LDL and UDS. Continue home ASA 81. Will follow  For detailed assessment and plan, please refer to above/below as I have made changes wherever appropriate.   Marvel Plan, MD PhD Stroke Neurology 01/21/2023 2:51 PM

## 2023-01-21 NOTE — Discharge Instructions (Addendum)
It was our pleasure to provide your ER care today - we hope that you feel better.  Your blood pressure is high - continue home meds and follow up closely with your doctor in one week.   Also from today's labs your potassium level is mildly low - eat plenty of fruits and vegetables, take potassium as prescribed, and follow up with your doctor in 1-2 weeks.  For your chronic anemia, take iron supplement as prescribed, and follow up closely with your doctor in the next 1-2 weeks - discuss possible additional therapies and follow up plan.   For recent symptoms, continue aspirin, and follow up closely with neurology in one week - call office to arrange appointment.  Return to ER right away if worse, new symptoms, fevers, chest pain, trouble breathing, change in speech or vision, new numbness/weakness, or other concern.

## 2023-01-21 NOTE — ED Provider Notes (Signed)
[place] EMERGENCY DEPARTMENT AT [facility] Provider Note   CSN: [id number] Arrival date & time: 01/21/23  1255  An emergency department physician performed an initial assessment on this suspected stroke patient at 1257.  History  Chief Complaint  Patient presents with   Code Stroke    Kaitlyn Gray is a 47 y.o. female.  Pt with c/o initially feeling faint/lightheaded, and then c/o right sided numbness tingling.  Pt limited historian - level 5 caveat. Not entirely clear from history when last felt/seen at baseline. Reports at baseline some left sided weakness. No trauma/fall. No syncope.  No headache. No neck pain. No chest pain or sob. No abd pain or nv.   The history is provided by the patient, medical records and the EMS personnel. The history is limited by the condition of the patient.       Home Medications Prior to Admission medications   Medication Sig Start Date End Date Taking? Authorizing Provider  amLODipine (NORVASC) 10 MG tablet Take 1 tablet (10 mg total) by mouth daily. 06/04/22 12/24/22  [place], Alvira Philips, DO  ARIPiprazole ER (ABILIFY MAINTENA) 400 MG SRER injection Inject 2 mLs (400 mg total) into the muscle every 28 (twenty-eight) days. 12/23/22   Rankin, Shuvon B, NP  aspirin EC 81 MG tablet Take 81 mg by mouth daily. Swallow whole.    [provider]  ferrous sulfate 325 (65 FE) MG tablet Take 1 tablet (325 mg total) by mouth 3 (three) times daily with meals. 01/21/23 04/21/23  Cathren Laine, MD  gabapentin (NEURONTIN) 100 MG capsule Take 1 capsule (100 mg total) by mouth 3 (three) times daily for pain scale 4-7. 12/01/22     losartan (COZAAR) 100 MG tablet Take 1 tablet (100 mg total) by mouth daily. 06/04/22 12/24/22  [place], Alvira Philips, DO  metFORMIN (GLUCOPHAGE) 500 MG tablet Take 1 tablet (500 mg total) by mouth 2 (two) times daily with a meal. 06/04/22 12/24/22  [place], Alvira Philips, DO  nicotine polacrilex (NICORETTE) 2 MG gum Take 1 each (2 mg  total) by mouth every 2 (two) hours as needed for Smoking Cravings Patient taking differently: Take 2 mg by mouth every 2 (two) hours as needed for smoking cessation. 12/01/22     pantoprazole (PROTONIX) 40 MG tablet Take 1 tablet (40 mg total) by mouth daily. 06/04/22 12/24/22  [place], Alvira Philips, DO  potassium chloride SA (KLOR-CON M) 20 MEQ tablet Take one po bid x 3 days, then one po once a day 01/21/23   Cathren Laine, MD  rosuvastatin (CRESTOR) 40 MG tablet Take 1 tablet (40 mg total) by mouth daily. 06/04/22 12/24/22  [place], Eric J, DO      Allergies    Tomato, Beef-derived products, Hydrocodone, and Latex    Review of Systems   Review of Systems  Constitutional:  Negative for fever.  Eyes:  Negative for visual disturbance.  Respiratory:  Negative for shortness of breath.   Cardiovascular:  Negative for chest pain.  Gastrointestinal:  Negative for abdominal pain and vomiting.  Genitourinary:  Negative for flank pain.  Musculoskeletal:  Negative for back pain and neck pain.  Skin:  Negative for rash.  Neurological:  Positive for numbness. Negative for headaches.    Physical Exam Updated Vital Signs BP (!) 168/89   Pulse 65   Temp 97.9 F (36.6 C)   Resp 15   Wt 80 kg   LMP 12/07/2022 (Approximate)   SpO2 100%   BMI  31.24 kg/m  Physical Exam Vitals and nursing note reviewed.  Constitutional:      Appearance: Normal appearance. She is well-developed.  HENT:     Head: Atraumatic.     Nose: Nose normal.     Mouth/Throat:     Mouth: Mucous membranes are moist.  Eyes:     General: No scleral icterus.    Extraocular Movements: Extraocular movements intact.     Conjunctiva/sclera: Conjunctivae normal.     Pupils: Pupils are equal, round, and reactive to light.  Neck:     Vascular: No carotid bruit.     Trachea: No tracheal deviation.  Cardiovascular:     Rate and Rhythm: Normal rate and regular rhythm.     Pulses: Normal pulses.     Heart sounds: Normal heart sounds. No  murmur heard.    No friction rub. No gallop.  Pulmonary:     Effort: Pulmonary effort is normal. No respiratory distress.     Breath sounds: Normal breath sounds.  Abdominal:     General: Bowel sounds are normal. There is no distension.     Palpations: Abdomen is soft.     Tenderness: There is no abdominal tenderness.  Genitourinary:    Comments: No cva tenderness.  Musculoskeletal:        General: No swelling or tenderness.     Cervical back: Normal range of motion and neck supple. No rigidity. No muscular tenderness.  Skin:    General: Skin is warm and dry.     Findings: No rash.  Neurological:     Mental Status: She is alert.     Cranial Nerves: No cranial nerve deficit.     Comments: Alert, speech normal. No new focal weakness noted on exam. Sens grossly intact.   Psychiatric:        Mood and Affect: Mood normal.     ED Results / Procedures / Treatments   Labs (all labs ordered are listed, but only abnormal results are displayed) Results for orders placed or performed during the hospital encounter of 01/21/23  Protime-INR  Result Value Ref Range   Prothrombin Time 14.6 11.4 - 15.2 seconds   INR 1.1 0.8 - 1.2  APTT  Result Value Ref Range   aPTT 29 24 - 36 seconds  CBC  Result Value Ref Range   WBC 4.7 4.0 - 10.5 K/uL   RBC 3.55 (L) 3.87 - 5.11 MIL/uL   Hemoglobin 7.9 (L) 12.0 - 15.0 g/dL   HCT 02.7 (L) 25.3 - 66.4 %   MCV 75.5 (L) 80.0 - 100.0 fL   MCH 22.3 (L) 26.0 - 34.0 pg   MCHC 29.5 (L) 30.0 - 36.0 g/dL   RDW 40.3 (H) 47.4 - 25.9 %   Platelets 304 150 - 400 K/uL   nRBC 0.0 0.0 - 0.2 %  Differential  Result Value Ref Range   Neutrophils Relative % 53 %   Neutro Abs 2.6 1.7 - 7.7 K/uL   Lymphocytes Relative 35 %   Lymphs Abs 1.7 0.7 - 4.0 K/uL   Monocytes Relative 9 %   Monocytes Absolute 0.4 0.1 - 1.0 K/uL   Eosinophils Relative 2 %   Eosinophils Absolute 0.1 0.0 - 0.5 K/uL   Basophils Relative 1 %   Basophils Absolute 0.0 0.0 - 0.1 K/uL   Immature  Granulocytes 0 %   Abs Immature Granulocytes 0.00 0.00 - 0.07 K/uL  Comprehensive metabolic panel  Result Value Ref Range   Sodium 138  135 - 145 mmol/L   Potassium 3.1 (L) 3.5 - 5.1 mmol/L   Chloride 101 98 - 111 mmol/L   CO2 22 22 - 32 mmol/L   Glucose, Bld 125 (H) 70 - 99 mg/dL   BUN 10 6 - 20 mg/dL   Creatinine, Ser 8.41 (H) 0.44 - 1.00 mg/dL   Calcium 9.0 8.9 - 32.4 mg/dL   Total Protein 6.8 6.5 - 8.1 g/dL   Albumin 3.3 (L) 3.5 - 5.0 g/dL   AST 13 (L) 15 - 41 U/L   ALT 12 0 - 44 U/L   Alkaline Phosphatase 54 38 - 126 U/L   Total Bilirubin 0.3 0.3 - 1.2 mg/dL   GFR, Estimated >40 >10 mL/min   Anion gap 15 5 - 15  Ethanol  Result Value Ref Range   Alcohol, Ethyl (B) <10 <10 mg/dL  I-stat chem 8, ED  Result Value Ref Range   Sodium 139 135 - 145 mmol/L   Potassium 3.2 (L) 3.5 - 5.1 mmol/L   Chloride 106 98 - 111 mmol/L   BUN 10 6 - 20 mg/dL   Creatinine, Ser 2.72 0.44 - 1.00 mg/dL   Glucose, Bld 536 (H) 70 - 99 mg/dL   Calcium, Ion 6.44 (L) 1.15 - 1.40 mmol/L   TCO2 24 22 - 32 mmol/L   Hemoglobin 8.8 (L) 12.0 - 15.0 g/dL   HCT 03.4 (L) 74.2 - 59.5 %  CBG monitoring, ED  Result Value Ref Range   Glucose-Capillary 130 (H) 70 - 99 mg/dL   MR BRAIN WO CONTRAST  Result Date: 01/21/2023 CLINICAL DATA:  Code stroke, right-sided weakness and slurred speech. EXAM: MRI HEAD WITHOUT CONTRAST MRA HEAD WITHOUT CONTRAST TECHNIQUE: Multiplanar, multi-echo pulse sequences of the brain and surrounding structures were acquired without intravenous contrast. Angiographic images of the Circle of Willis were acquired using MRA technique without intravenous contrast. COMPARISON:  Same-day head CT, MR head 12/06/2022, MRA head 10/23/2022 FINDINGS: MRI HEAD FINDINGS Brain: There is no acute intracranial hemorrhage, extra-axial fluid collection, or acute infarct Parenchymal volume is stable. The ventricles are stable in size. Patchy and confluent FLAIR signal abnormality in the supratentorial white  matter likely reflecting underlying chronic small-vessel ischemic change is stable. 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 There are punctate chronic blood products in the cerebellum, right middle cerebellar peduncle, and thalami. The pituitary and suprasellar region are normal. There is no mass lesion. There is no mass effect or midline shift. Vascular: See below. Skull and upper cervical spine: Normal marrow signal. Sinuses/Orbits: No acute or significant finding. Other: The mastoid air cells and middle ear cavities are clear. MRA HEAD FINDINGS Anterior circulation: The intracranial ICAs are patent, without significant stenosis or occlusion. The bilateral MCAs are patent, without proximal stenosis or occlusion. The bilateral ACAS are patent with moderate multifocal irregularity and narrowing distally, unchanged. There is no aneurysm or AVM. Posterior circulation: The bilateral V4 segments are patent. The basilar artery is patent. The major cerebellar arteries appear patent. The distal right PCA is not well delineated, likely occluded, in the region of the known right PCA infarct. The left PCA is patent, without proximal high-grade stenosis or occlusion. Small right larger than left posterior communicating arteries are identified. There is no aneurysm or AVM. Anatomic variants: None. IMPRESSION: 1. No acute intracranial pathology. 2. Occluded distal right PCA and moderate atherosclerotic irregularity and narrowing of the distal ACAS, unchanged.  No new emergent vascular finding. Electronically Signed   By: Lesia Hausen M.D.   On: 01/21/2023 14:33   MR ANGIO HEAD WO CONTRAST  Result Date: 01/21/2023 CLINICAL DATA:  Code stroke, right-sided weakness and slurred speech. EXAM: MRI HEAD WITHOUT CONTRAST MRA HEAD WITHOUT CONTRAST TECHNIQUE: Multiplanar, multi-echo pulse sequences of the brain and surrounding  structures were acquired without intravenous contrast. Angiographic images of the Circle of Willis were acquired using MRA technique without intravenous contrast. COMPARISON:  Same-day head CT, MR head 12/06/2022, MRA head 10/23/2022 FINDINGS: MRI HEAD FINDINGS Brain: There is no acute intracranial hemorrhage, extra-axial fluid collection, or acute infarct Parenchymal volume is stable. The ventricles are stable in size. Patchy and confluent FLAIR signal abnormality in the supratentorial white matter likely reflecting underlying chronic small-vessel ischemic change is stable. 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 There are punctate chronic blood products in the cerebellum, right middle cerebellar peduncle, and thalami. The pituitary and suprasellar region are normal. There is no mass lesion. There is no mass effect or midline shift. Vascular: See below. Skull and upper cervical spine: Normal marrow signal. Sinuses/Orbits: No acute or significant finding. Other: The mastoid air cells and middle ear cavities are clear. MRA HEAD FINDINGS Anterior circulation: The intracranial ICAs are patent, without significant stenosis or occlusion. The bilateral MCAs are patent, without proximal stenosis or occlusion. The bilateral ACAS are patent with moderate multifocal irregularity and narrowing distally, unchanged. There is no aneurysm or AVM. Posterior circulation: The bilateral V4 segments are patent. The basilar artery is patent. The major cerebellar arteries appear patent. The distal right PCA is not well delineated, likely occluded, in the region of the known right PCA infarct. The left PCA is patent, without proximal high-grade stenosis or occlusion. Small right larger than left posterior communicating arteries are identified. There is no aneurysm or AVM. Anatomic variants: None. IMPRESSION: 1. No acute intracranial  pathology. 2. Occluded distal right PCA and moderate atherosclerotic irregularity and narrowing of the distal ACAS, unchanged. No new emergent vascular finding. Electronically Signed   By: Lesia Hausen M.D.   On: 01/21/2023 14:33   CT HEAD CODE STROKE WO CONTRAST  Result Date: 01/21/2023 CLINICAL DATA:  Code stroke.  Right-sided weakness, slurred speech. EXAM: CT HEAD WITHOUT CONTRAST TECHNIQUE: Contiguous axial images were obtained from the base of the skull through the vertex without intravenous contrast. RADIATION DOSE REDUCTION: This exam was performed according to the departmental dose-optimization program which includes automated exposure control, adjustment of the mA and/or kV according to patient size and/or use of iterative reconstruction technique. COMPARISON:  CT orbits 01/03/2023, MR head 12/06/2022 FINDINGS: Brain: There is no acute intracranial hemorrhage, extra-axial fluid collection, or acute infarct. Parenchymal volume is stable. The ventricles are stable in size. 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 The pituitary and suprasellar region are normal. There is no mass lesion. There is no mass effect or midline shift. Vascular: No hyperdense vessel or unexpected calcification. Skull: Normal. Negative for fracture or focal lesion. Sinuses/Orbits: There is mild mucosal thickening in the paranasal sinuses. The globes and orbits are unremarkable. Other: The mastoid air cells and middle ear cavities are clear. ASPECTS [facility] Stroke Program Early CT Score) - Ganglionic level infarction (caudate, lentiform nuclei, internal capsule, insula, M1-M3 cortex): 7 - Supraganglionic infarction (M4-M6 cortex): 3 Total score (0-10 with 10  being normal): 10 IMPRESSION: Stable noncontrast head CT with no acute intracranial pathology. Electronically Signed   By: Lesia Hausen M.D.   On: 01/21/2023 13:22   CT  Orbits Wo Contrast  Result Date: 01/07/2023 CLINICAL DATA:  47 year old female status post blunt trauma including to the left eye. Orbital pain. EXAM: CT ORBITS WITHOUT CONTRAST TECHNIQUE: Multidetector CT imaging of the orbits was performed using the standard protocol without intravenous contrast. Multiplanar CT image reconstructions were also generated. RADIATION DOSE REDUCTION: This exam was performed according to the departmental dose-optimization program which includes automated exposure control, adjustment of the mA and/or kV according to patient size and/or use of iterative reconstruction technique. COMPARISON:  Head CT 12/19/2022 and earlier. FINDINGS: Orbits: Chronic right lamina papyracea fracture, stable. No acute orbital wall fracture. Disconjugate gaze which is more pronounced than on 12/19/22. But globes and intraorbital soft tissues otherwise appear intact and normal. Mild if any asymmetric preseptal soft tissue thickening on the left. No soft tissue gas or fluid collection. Visible paranasal sinuses: Scattered mild mucosal thickening and small retention cysts are unchanged from earlier this month. Tympanic cavities and mastoids appear clear. Soft tissues: Visualized scalp soft tissues are within normal limits. Negative visible noncontrast deep soft tissue spaces of the face. Osseous: Partially visible carious maxillary dentition. Central skull base is intact. Normal craniocervical junction alignment. Chronic nasal bone fractures appear stable. Visible calvarium intact. Limited intracranial: Stable compared to head CT earlier this month. IMPRESSION: 1. Mild if any preseptal orbit soft tissue swelling. No other acute traumatic injury identified; chronic right lamina papyracea and nasal bone fractures. 2. Maxillary dental caries. Electronically Signed   By: Odessa Fleming M.D.   On: 01/07/2023 06:53   DG Wrist Complete Left  Result Date: 01/04/2023 CLINICAL DATA:  Status post fall.  Hypertension. EXAM:  LEFT WRIST - COMPLETE 3+ VIEW COMPARISON:  None Available. FINDINGS: No signs of acute fracture or dislocation. No significant arthropathy. Soft tissues are unremarkable. IMPRESSION: Negative. Electronically Signed   By: Signa Kell M.D.   On: 01/04/2023 13:28     EKG EKG Interpretation Date/Time:  Wednesday January 21 2023 14:13:25 EDT Ventricular Rate:  65 PR Interval:  132 QRS Duration:  109 QT Interval:  461 QTC Calculation: 480 R Axis:   67  Text Interpretation: Sinus arrhythmia Nonspecific ST abnormality No significant change since last tracing Confirmed by Cathren Laine ([id number]) on 01/21/2023 2:32:27 PM  Radiology MR BRAIN WO CONTRAST  Result Date: 01/21/2023 CLINICAL DATA:  Code stroke, right-sided weakness and slurred speech. EXAM: MRI HEAD WITHOUT CONTRAST MRA HEAD WITHOUT CONTRAST TECHNIQUE: Multiplanar, multi-echo pulse sequences of the brain and surrounding structures were acquired without intravenous contrast. Angiographic images of the Circle of Willis were acquired using MRA technique without intravenous contrast. COMPARISON:  Same-day head CT, MR head 12/06/2022, MRA head 10/23/2022 FINDINGS: MRI HEAD FINDINGS Brain: There is no acute intracranial hemorrhage, extra-axial fluid collection, or acute infarct Parenchymal volume is stable. The ventricles are stable in size. Patchy and confluent FLAIR signal abnormality in the supratentorial white matter likely reflecting underlying chronic small-vessel ischemic change is stable. 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 There are punctate chronic blood products in the cerebellum, right middle cerebellar peduncle, and thalami. The pituitary and suprasellar region are normal. There is no mass lesion. There is no mass effect or midline shift. Vascular: See below. Skull and upper cervical spine: Normal marrow signal.  Sinuses/Orbits: No  acute or significant finding. Other: The mastoid air cells and middle ear cavities are clear. MRA HEAD FINDINGS Anterior circulation: The intracranial ICAs are patent, without significant stenosis or occlusion. The bilateral MCAs are patent, without proximal stenosis or occlusion. The bilateral ACAS are patent with moderate multifocal irregularity and narrowing distally, unchanged. There is no aneurysm or AVM. Posterior circulation: The bilateral V4 segments are patent. The basilar artery is patent. The major cerebellar arteries appear patent. The distal right PCA is not well delineated, likely occluded, in the region of the known right PCA infarct. The left PCA is patent, without proximal high-grade stenosis or occlusion. Small right larger than left posterior communicating arteries are identified. There is no aneurysm or AVM. Anatomic variants: None. IMPRESSION: 1. No acute intracranial pathology. 2. Occluded distal right PCA and moderate atherosclerotic irregularity and narrowing of the distal ACAS, unchanged. No new emergent vascular finding. Electronically Signed   By: Lesia Hausen M.D.   On: 01/21/2023 14:33   MR ANGIO HEAD WO CONTRAST  Result Date: 01/21/2023 CLINICAL DATA:  Code stroke, right-sided weakness and slurred speech. EXAM: MRI HEAD WITHOUT CONTRAST MRA HEAD WITHOUT CONTRAST TECHNIQUE: Multiplanar, multi-echo pulse sequences of the brain and surrounding structures were acquired without intravenous contrast. Angiographic images of the Circle of Willis were acquired using MRA technique without intravenous contrast. COMPARISON:  Same-day head CT, MR head 12/06/2022, MRA head 10/23/2022 FINDINGS: MRI HEAD FINDINGS Brain: There is no acute intracranial hemorrhage, extra-axial fluid collection, or acute infarct Parenchymal volume is stable. The ventricles are stable in size. Patchy and confluent FLAIR signal abnormality in the supratentorial white matter likely reflecting underlying chronic  small-vessel ischemic change is stable. 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 There are punctate chronic blood products in the cerebellum, right middle cerebellar peduncle, and thalami. The pituitary and suprasellar region are normal. There is no mass lesion. There is no mass effect or midline shift. Vascular: See below. Skull and upper cervical spine: Normal marrow signal. Sinuses/Orbits: No acute or significant finding. Other: The mastoid air cells and middle ear cavities are clear. MRA HEAD FINDINGS Anterior circulation: The intracranial ICAs are patent, without significant stenosis or occlusion. The bilateral MCAs are patent, without proximal stenosis or occlusion. The bilateral ACAS are patent with moderate multifocal irregularity and narrowing distally, unchanged. There is no aneurysm or AVM. Posterior circulation: The bilateral V4 segments are patent. The basilar artery is patent. The major cerebellar arteries appear patent. The distal right PCA is not well delineated, likely occluded, in the region of the known right PCA infarct. The left PCA is patent, without proximal high-grade stenosis or occlusion. Small right larger than left posterior communicating arteries are identified. There is no aneurysm or AVM. Anatomic variants: None. IMPRESSION: 1. No acute intracranial pathology. 2. Occluded distal right PCA and moderate atherosclerotic irregularity and narrowing of the distal ACAS, unchanged. No new emergent vascular finding. Electronically Signed   By: Lesia Hausen M.D.   On: 01/21/2023 14:33   CT HEAD CODE STROKE WO CONTRAST  Result Date: 01/21/2023 CLINICAL DATA:  Code stroke.  Right-sided weakness, slurred speech. EXAM: CT HEAD WITHOUT CONTRAST TECHNIQUE: Contiguous axial images were obtained from the base of the skull through the vertex without intravenous contrast. RADIATION DOSE REDUCTION:  This exam was performed according to the departmental dose-optimization program which includes automated exposure control, adjustment of the mA and/or kV according to patient  size and/or use of iterative reconstruction technique. COMPARISON:  CT orbits 01/03/2023, MR head 12/06/2022 FINDINGS: Brain: There is no acute intracranial hemorrhage, extra-axial fluid collection, or acute infarct. Parenchymal volume is stable. The ventricles are stable in size. 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 The pituitary and suprasellar region are normal. There is no mass lesion. There is no mass effect or midline shift. Vascular: No hyperdense vessel or unexpected calcification. Skull: Normal. Negative for fracture or focal lesion. Sinuses/Orbits: There is mild mucosal thickening in the paranasal sinuses. The globes and orbits are unremarkable. Other: The mastoid air cells and middle ear cavities are clear. ASPECTS ([place] Stroke Program Early CT Score) - Ganglionic level infarction (caudate, lentiform nuclei, internal capsule, insula, M1-M3 cortex): 7 - Supraganglionic infarction (M4-M6 cortex): 3 Total score (0-10 with 10 being normal): 10 IMPRESSION: Stable noncontrast head CT with no acute intracranial pathology. Electronically Signed   By: Lesia Hausen M.D.   On: 01/21/2023 13:22    Procedures Procedures    Medications Ordered in ED Medications  potassium chloride SA (KLOR-CON M) CR tablet 40 mEq (has no administration in time range)  sodium chloride flush (NS) 0.9 % injection 3 mL (3 mLs Intravenous Given 01/21/23 1412)    ED Course/ Medical Decision Making/ A&P                                 Medical Decision Making Problems Addressed: Chronic anemia: chronic illness or injury with exacerbation, progression, or side effects of treatment that poses a threat to life or bodily functions Essential  hypertension: chronic illness or injury with exacerbation, progression, or side effects of treatment that poses a threat to life or bodily functions History of stroke: chronic illness or injury with exacerbation, progression, or side effects of treatment that poses a threat to life or bodily functions Hypokalemia: acute illness or injury Near syncope: acute illness or injury with systemic symptoms that poses a threat to life or bodily functions Other iron deficiency anemia: chronic illness or injury with exacerbation, progression, or side effects of treatment that poses a threat to life or bodily functions Paresthesia: acute illness or injury with systemic symptoms  Amount and/or Complexity of Data Reviewed Independent Historian: EMS    Details: hx External Data Reviewed: labs, radiology and notes. Labs: ordered. Decision-making details documented in ED Course. Radiology: ordered and independent interpretation performed. Decision-making details documented in ED Course. ECG/medicine tests: ordered and independent interpretation performed. Decision-making details documented in ED Course. Discussion of management or test interpretation with external provider(s): neurology  Risk Prescription drug management. Decision regarding hospitalization.   Iv ns. Continuous pulse ox and cardiac monitoring. Labs ordered/sent. Imaging ordered.   Differential diagnosis includes cva, tia, paresthesias, etc. Dispo decision including potential need for admission considered - will get labs and imaging and reassess.   Reviewed nursing notes and prior charts for additional history. External reports reviewed. Additional history from: EMS.   Pt noted to have several prior evals for acute neuro symptoms - last several mris demonstrating no new/acute stroke, but note made of remote/old stroke.   Cardiac monitor: sinus rhythm, rate 66.  Labs reviewed/interpreted by me - k low. Kcl po. Hx same. Chronic anemia, hct  similar to baseline. Denies blood loss, rectal bleeding or melena.   CT reviewed/interpreted by me - no hem.   MRI  reviewed/interpreted by me - no acute  cva.   Given multiple prior evals for cva and prior complete cva workup, and given no new focal deficit on exam, pt appears stable for d/c.   Rec close outpatient pcp/neurology f/u.  Return precautions provided.           Final Clinical Impression(s) / ED Diagnoses Final diagnoses:  Paresthesia  Near syncope  Hypokalemia  History of stroke  Essential hypertension  Chronic anemia  Other iron deficiency anemia    Rx / DC Orders ED Discharge Orders          Ordered    ferrous sulfate 325 (65 FE) MG tablet  3 times daily with meals        01/21/23 1547    potassium chloride SA (KLOR-CON M) 20 MEQ tablet        01/21/23 1548              Cathren Laine, MD 01/21/23 1551

## 2023-01-21 NOTE — ED Triage Notes (Signed)
Pt BIB EMS due to a code stroke. Pt coming home. Pt had stroke in October pt had left sided deficits/ slurred speech, and facial droop. Pt c/o right sided weakness. LSN unknown.

## 2023-01-21 NOTE — Code Documentation (Signed)
Kaitlyn Gray is a 47 yr old female presenting to [facility] on 01/21/2023 with a PMH of prior stroke, bipolar, substance abuse. Pt was found sitting in her rollator outside of her apartment complex by a bystander, looking unwell. She told bystander to call 911. For EMS, pt has been weak all over, dysarthric, and having fluctuating levels of consciousness. Unknown last known well time. Pt not on any known thinner.    Pt met at bridge by stroke team. Airway cleared by EDP. Labs, CBG obtained. Pt to CT with team. NIHSS 20. (Please see documentation for NIHSS details and times). The following imaging was obtained: CT head. Per Dr. Roda Shutters, CT is negative for acute abnormality. Pt taken to MRI to evaluate for Thrombolytic eligibility. Per Dr. Roda Shutters, MRI is negative for stroke. Code stroke cancelled at 13:47. Pt returned to Trauma C, placed back on monitor. Handoff with Radiographer, therapeutic. Pt not candidate for thrombolytic or thrombectomy as negative for stroke.

## 2023-01-21 NOTE — ED Notes (Signed)
Pt refusing to leave, security at bedside. 

## 2023-01-27 ENCOUNTER — Encounter: Payer: Self-pay | Admitting: *Deleted

## 2023-01-27 NOTE — Progress Notes (Signed)
Pt attended 01/10/23 screening event where her b/p was 162/90 and her blood sugar was 108. At the event, the pt noted "Benedict" was her PCP and identified SDOH insecurities for housing, utilities, food, transportation, and safety, and was given resources addressing all these needs at the event. Chart review indicates pt has been seen in the ED and/or [facility] ED approximately 20 times in the past 2 months, with multiple documentations of no-show appt at [facility]. In addition, pt's chart indicates she does have the [employer] of Medicaid. Further chart review documents pt saw Dr. Georganna Skeans at [facility]  on 07/17/22 to get established with a PCP but pt no-showed March and April office visits with Dr. Andrey Campanile. Pt did have office visit with [facility] provider [name], NP on 12/23/22 but pt no-showed 01/21/23 [facility] appt. At the event, the pt did not list a phone number and her last known phone # in [facility] gives a message that the call cannot be completed. In-basket sent to [name] and Dr. Andrey Campanile  and Robyne Peers, RN CM, who also works with Dr. [facility], to share latest health equity event screening results. Letter with results and Get Care Now flier and Primary Care clinic fliers (with Dr. Tawana Scale clinic highlighted) mailed to pt at her last known address

## 2023-02-05 ENCOUNTER — Telehealth: Payer: Self-pay

## 2023-02-05 NOTE — Telephone Encounter (Signed)
Message received from Dorie Rank, RN/Health Equity noting that the patient expressed need for assistance with housing, transportation, food, safety and utilities at a community event.  She was provided with resources at that time.  I tried to reach her : [phone] but the recording stated that the call cannot be completed at this time.  I wanted to review her resource needs and discuss scheduling follow up care with a PCP.  Will try to contact [facility] to inquire if she has been assigned a care management agency.

## 2023-02-08 ENCOUNTER — Other Ambulatory Visit: Payer: Self-pay

## 2023-02-08 ENCOUNTER — Emergency Department (HOSPITAL_COMMUNITY)
Admission: EM | Admit: 2023-02-08 | Discharge: 2023-02-09 | Disposition: A | Discharge status: Other Acute Inpatient Hospital | Payer: MEDICAID | Attending: Emergency Medicine | Admitting: Emergency Medicine

## 2023-02-08 ENCOUNTER — Encounter (HOSPITAL_COMMUNITY): Payer: Self-pay

## 2023-02-08 DIAGNOSIS — F1420 Cocaine dependence, uncomplicated: Secondary | ICD-10-CM

## 2023-02-08 DIAGNOSIS — Z91040 Latex allergy status: Secondary | ICD-10-CM | POA: Diagnosis not present

## 2023-02-08 DIAGNOSIS — Z87891 Personal history of nicotine dependence: Secondary | ICD-10-CM | POA: Insufficient documentation

## 2023-02-08 DIAGNOSIS — Z20822 Contact with and (suspected) exposure to covid-19: Secondary | ICD-10-CM | POA: Insufficient documentation

## 2023-02-08 DIAGNOSIS — F1910 Other psychoactive substance abuse, uncomplicated: Secondary | ICD-10-CM

## 2023-02-08 DIAGNOSIS — Z7982 Long term (current) use of aspirin: Secondary | ICD-10-CM | POA: Insufficient documentation

## 2023-02-08 DIAGNOSIS — F1410 Cocaine abuse, uncomplicated: Secondary | ICD-10-CM | POA: Diagnosis present

## 2023-02-08 DIAGNOSIS — R45851 Suicidal ideations: Secondary | ICD-10-CM | POA: Diagnosis not present

## 2023-02-08 DIAGNOSIS — F1220 Cannabis dependence, uncomplicated: Secondary | ICD-10-CM | POA: Diagnosis not present

## 2023-02-08 DIAGNOSIS — F333 Major depressive disorder, recurrent, severe with psychotic symptoms: Secondary | ICD-10-CM | POA: Diagnosis not present

## 2023-02-08 DIAGNOSIS — I10 Essential (primary) hypertension: Secondary | ICD-10-CM | POA: Diagnosis not present

## 2023-02-08 LAB — COMPREHENSIVE METABOLIC PANEL
ALT: 14 U/L (ref 0–44)
AST: 15 U/L (ref 15–41)
Albumin: 3.4 g/dL — ABNORMAL LOW (ref 3.5–5.0)
Alkaline Phosphatase: 52 U/L (ref 38–126)
Anion gap: 10 (ref 5–15)
BUN: 14 mg/dL (ref 6–20)
CO2: 25 mmol/L (ref 22–32)
Calcium: 9.4 mg/dL (ref 8.9–10.3)
Chloride: 104 mmol/L (ref 98–111)
Creatinine, Ser: 0.84 mg/dL (ref 0.44–1.00)
GFR, Estimated: >60 mL/min (ref >60)
Glucose, Bld: 102 mg/dL — ABNORMAL HIGH (ref 70–99)
Potassium: 4.1 mmol/L (ref 3.5–5.1)
Sodium: 139 mmol/L (ref 135–145)
Total Bilirubin: 0.5 mg/dL (ref 0.3–1.2)
Total Protein: 6.6 g/dL (ref 6.5–8.1)

## 2023-02-08 LAB — CBC
HCT: 27.2 % — ABNORMAL LOW (ref 36.0–46.0)
Hemoglobin: 7.9 g/dL — ABNORMAL LOW (ref 12.0–15.0)
MCH: 22.2 pg — ABNORMAL LOW (ref 26.0–34.0)
MCHC: 29 g/dL — ABNORMAL LOW (ref 30.0–36.0)
MCV: 76.4 fL — ABNORMAL LOW (ref 80.0–100.0)
Platelets: 395 10*3/uL (ref 150–400)
RBC: 3.56 MIL/uL — ABNORMAL LOW (ref 3.87–5.11)
RDW: 18.5 % — ABNORMAL HIGH (ref 11.5–15.5)
WBC: 4.1 10*3/uL (ref 4.0–10.5)
nRBC: 0 % (ref 0.0–0.2)

## 2023-02-08 LAB — RESP PANEL BY RT-PCR (RSV, FLU A&B, COVID)  RVPGX2
Influenza A by PCR: NEGATIVE
Influenza B by PCR: NEGATIVE
Resp Syncytial Virus by PCR: NEGATIVE
SARS Coronavirus 2 by RT PCR: NEGATIVE

## 2023-02-08 LAB — HCG, SERUM, QUALITATIVE: Preg, Serum: NEGATIVE

## 2023-02-08 LAB — ACETAMINOPHEN LEVEL: Acetaminophen (Tylenol), Serum: <10 ug/mL — ABNORMAL LOW (ref 10–30)

## 2023-02-08 LAB — ETHANOL: Alcohol, Ethyl (B): <10 mg/dL (ref <10)

## 2023-02-08 LAB — SALICYLATE LEVEL: Salicylate Lvl: <7 mg/dL — ABNORMAL LOW (ref 7.0–30.0)

## 2023-02-08 MED ORDER — DIPHENHYDRAMINE HCL 25 MG PO CAPS
50.0000 mg | ORAL_CAPSULE | Freq: Once | ORAL | Status: AC
  Administered 2023-02-08: 50 mg via ORAL
  Filled 2023-02-08: qty 2

## 2023-02-08 MED ORDER — PAROXETINE HCL 20 MG PO TABS
20.0000 mg | ORAL_TABLET | Freq: Every day | ORAL | Status: DC
  Administered 2023-02-08 – 2023-02-09 (×2): 20 mg via ORAL
  Filled 2023-02-08 (×2): qty 1

## 2023-02-08 MED ORDER — OLANZAPINE 5 MG PO TBDP
5.0000 mg | ORAL_TABLET | Freq: Once | ORAL | Status: AC
  Administered 2023-02-08: 5 mg via ORAL
  Filled 2023-02-08: qty 1

## 2023-02-08 MED ORDER — QUETIAPINE FUMARATE 200 MG PO TABS
200.0000 mg | ORAL_TABLET | Freq: Every day | ORAL | Status: DC
  Administered 2023-02-08: 200 mg via ORAL
  Filled 2023-02-08: qty 1

## 2023-02-08 NOTE — ED Notes (Signed)
When Pt was told she had to change into scrubs and could not have something to drink until she was seen by a EDP, Pt stated "I think I'm now having a stroke.  I have L sided weakness and slurred speech."  After Pt was told she would be moving to a room soon and then could have something to drink, Pt admitted the weakness and slurred speech is not new.  Pt noted to ambulate w/ Rollator w/o difficulty.

## 2023-02-08 NOTE — ED Notes (Signed)
Lunch tray ordered 

## 2023-02-08 NOTE — ED Notes (Signed)
IVC paperwork in purple zone 

## 2023-02-08 NOTE — ED Notes (Signed)
Pt belongings placed in large locker #2:    Black top, black bra, white shorts, pair of socks, black and white Croc-like shoes placed in Pt belonging bag.  Also, baggie with orange lighter, cigar, black card, and 2 keys w/ a green lanyard in belongings bag.

## 2023-02-08 NOTE — ED Notes (Signed)
Spoke to [place], [place] who is the provider for this pt. IVC is currently in process.

## 2023-02-08 NOTE — Consult Note (Addendum)
BH ED ASSESSMENT   Reason for Consult:  Psych Consult  Referring Physician:  Gareth Eagle, PA-C  Patient Identification: Kaitlyn Gray MRN:  [id number] ED Chief Complaint: MDD (major depressive disorder), recurrent, severe, with psychosis (HCC)  Diagnosis:  Principal Problem:   MDD (major depressive disorder), recurrent, severe, with psychosis (HCC) Active Problems:   Cannabis use disorder, moderate, dependence (HCC)   Polysubstance abuse (HCC)   Cocaine use disorder, moderate, dependence (HCC)   ED Assessment Time Calculation: Start Time: 1400 Stop Time: 1427 Total Time in Minutes (Assessment Completion): 27   Subjective:   Kaitlyn Gray is a 47 y.o. AA female with a past psychiatric history of bipolar 1 disorder, MDD, adjustment disorder with disturbance of conduct, cannabis use disorder, cocaine use disorder, polysubstance abuse, and altered mental status, with pertinent medical comorbidities/history that include ischemic stroke (2021), hypertension, chronic anemia, cervical fracture, dysarthria, and left-sided weakness, and additional past and pertinent psychiatric history of multiple emergency department, inpatient hospitalizations, and urgent care encounters for, and/or the combination of, decompensation of the patient's mental health in the form of psychosis, worsening depression or anxiety, suicidal or homicidal ideations, malingering, housing instability, and/or secondary gain, who presented this encounter by way of EMS for decompensation of her mental health in the form of a recrudescence of active suicidal ideations with intent and plan to lay on train tracks near her home and attempt to end her life, auditory and visual hallucinations that are negative and command in nature, and worsening depression and anxiety.  Patient currently is medically cleared and is appropriate for psychiatric consultation at this time per EDP team.  Patient is currently involuntarily  committed.  Patient is well-known to the behavioral health service line through [facility] health, as well as this provider, notably evaluated the patient in the emergency department on 12/13/2022 and subsequently discharged the patient for malingering and secondary gain on 12/14/2022 by the patient's own endorsements.  HPI:    Patient seen today at [facility] emergency department for face-to-face psychiatric evaluation.  Upon evaluation, patient tells me that she has been having a progressive worsening of her overall mood, states that she has been very depressed and anxious lately over about the last month, but is now having active suicidal ideations with plan and intent to lay on the train tracks near her home and commit suicide.  She tells me that her current medications of long-acting injection Abilify are not helpful at all, states that since receiving her Abilify long-acting injection (12/23/2022, confirmed) she has been experiencing worsening anxiety, troubles with sleep, restlessness, worsening depression, and a recrudescence of auditory visual hallucinations that are negative and command in nature. She reports currently she is not on any psychiatric medications.  She reports currently that she is hearing auditory hallucinations of voices telling her to kill herself, that she is ugly, and that she is no good, as well as states that she is having visual hallucinations of seeing people on the walls around her that are laughing at her, calling her names, telling her that she is ugly, and that she needs to die.  Patient endorses that the hallucinations she is experiencing are also contributing to thoughts of suicide and decompensation of her overall mood and to worsening depression and anxiety.   She reports that she is not seeing anyone outpatient for mental health, as well as is not being watched over by a community ACT team.  She reports the last time that she had psychiatric care  was at the [facility]  crisis center, then forwards that she does not like the place, states that everyone there is mean to her. She admits that she continues to utilize cocaine and cannabis very frequently, as well as states that she is continuing to smoke tobacco daily in the form of cigars, denies any EtOH use.   She reports that she continues to eat fairly well, no deficits in appetite.  She reports that her desire today is to restart previous medications of Paxil and Seroquel, states that they have been helpful for her.  She states that until she is able to get "stabilized" on her medications, has desire for inpatient hospitalization.  Patient objectively during evaluation does not present with appearing to be responding to internal stimuli.  Patient does not endorse any paranoid ideations or give any clinical suspicion for being paranoid.  Patient does not endorse any delusional themes.  Patient reports a history of multiple suicide attempts over the years, states that she cannot remember any of them though when asked, just states "several, it is difficult to really remember".  Patient endorses a history of self injures behavior by way of utilizing a razor blade for several years now to cut her left inner forearm superficially, states that the last time she performed this behavior was "months ago".  Past Psychiatric History: Adjustment disorder with disturbance of conduct, bipolar 1 disorder, cannabis use disorder moderate dependence, cocaine use disorder severe dependence, polysubstance abuse, MDD, malingering, housing instability, secondary gain; multiple emergency department, urgent care, and inpatient hospitalizations for decompensation of the patient's mental health over the years  Risk to Self or Others: Is the patient at risk to self? Yes Has the patient been a risk to self in the past 6 months? Yes Has the patient been a risk to self within the distant past? Yes Is the patient a risk to others? No Has the patient  been a risk to others in the past 6 months? No Has the patient been a risk to others within the distant past? No  [place] Scale:  Flowsheet Row ED from 02/08/2023 in [facility] Emergency Department at [facility] ED from 01/21/2023 in [facility] Department at [facility] ED from 01/07/2023 in [facility] Emergency Department at [facility]  C-SSRS RISK CATEGORY High Risk Error: Q3, 4, or 5 should not be populated when Q2 is No No Risk       Substance Abuse: Cocaine, cannabis "years"  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) 07/19/2017   Hypertension    Hypertensive emergency 05/31/2022   Ischemic stroke (HCC) 01/01/2020   subacute right middle cerebellar peduncle and pons infarction   Left-sided weakness 01/27/2022   Head CT with remote right occipital infarct which is consistent with old left-sided weakness   MDD (major depressive disorder), recurrent severe, 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   Normocytic anemia 02/09/2020   Opiate use  History of Suboxone Therapy until 05/2021   Polysubstance abuse (HCC) 07/19/2017   Prescription drug misadventures (Seroquel)     Past Surgical History:  Procedure Laterality Date   CESAREAN SECTION     Family History:  Family History  Problem Relation Age of Onset   Hypertension Mother    CAD Mother 45       died of MI at age 66   Hypertension Father    Family  Psychiatric  History: None endorsed Social History:  Social History   Substance and Sexual Activity  Alcohol Use Not Currently     Social History   Substance and Sexual Activity  Drug Use Yes   Types: Marijuana, Cocaine   Comment: occassionally    Social History   Socioeconomic History   Marital status: Single    Spouse name: Not on file   Number of children: Not on file   Years of education: Not on file   Highest education level: Not on file  Occupational History   Not on file  Tobacco Use   Smoking status: Former    Current packs/day: 0.50    Types: Cigarettes    Passive exposure: Current   Smokeless tobacco: Former  Building services engineer status: Never Used  Substance and Sexual Activity   Alcohol use: Not Currently   Drug use: Yes    Types: Marijuana, Cocaine    Comment: occassionally   Sexual activity: Not on file  Other Topics Concern   Not on file  Social History Narrative   ** Merged History Encounter **       Social Determinants of Health   Financial Resource Strain: Not on file  Food Insecurity: Food Insecurity Present (01/10/2023)   Hunger Vital Sign    Worried About Running Out of Food in the Last Year: Often true    Ran Out of Food in the Last Year: Often true  Transportation Needs: Unmet Transportation Needs (01/10/2023)   PRAPARE - Administrator, Civil Service (Medical): Yes    Lack of Transportation (Non-Medical): Yes  Physical Activity: Not on file  Stress: Not on file  Social Connections: Not on file   Additional Social History:    Allergies:   Allergies  Allergen Reactions   Tomato Anaphylaxis    Pt reports this as an allergy, but has been eating ketchup and pizza on previous visits w/o any s/s of allergic reaction/anaphylaxis.    Pork-Derived Products Itching   Hydrocodone Itching   Latex Itching and Rash    Labs:  Results for orders placed or performed during the hospital encounter of 02/08/23 (from the past 48  hour(s))  Comprehensive metabolic panel     Status: Abnormal   Collection Time: 02/08/23 11:10 AM  Result Value Ref Range   Sodium 139 135 - 145 mmol/L   Potassium 4.1 3.5 - 5.1 mmol/L   Chloride 104 98 - 111 mmol/L   CO2 25 22 - 32 mmol/L   Glucose, Bld 102 (H) 70 - 99 mg/dL    Comment: Glucose reference range applies only to samples taken after fasting for at least 8 hours.   BUN 14 6 - 20 mg/dL   Creatinine, Ser 6.96 0.44 - 1.00 mg/dL   Calcium 9.4 8.9 - 29.5 mg/dL   Total Protein 6.6 6.5 - 8.1 g/dL   Albumin 3.4 (L) 3.5 - 5.0 g/dL   AST 15 15 - 41 U/L   ALT 14 0 - 44 U/L  Alkaline Phosphatase 52 38 - 126 U/L   Total Bilirubin 0.5 0.3 - 1.2 mg/dL   GFR, Estimated >32 >44 mL/min    Comment: (NOTE) Calculated using the CKD-EPI Creatinine Equation (2021)    Anion gap 10 5 - 15    Comment: Performed at [facility], [address] [place], [place] [address]  Ethanol     Status: None   Collection Time: 02/08/23 11:10 AM  Result Value Ref Range   Alcohol, Ethyl (B) <10 <10 mg/dL    Comment: (NOTE) Lowest detectable limit for serum alcohol is 10 mg/dL.  For medical purposes only. Performed at [facility], [address], [place], [place] [address]   Salicylate level     Status: Abnormal   Collection Time: 02/08/23 11:10 AM  Result Value Ref Range   Salicylate Lvl <7.0 (L) 7.0 - 30.0 mg/dL    Comment: Performed at [facility], [address] [place], [place] [address]  Acetaminophen level     Status: Abnormal   Collection Time: 02/08/23 11:10 AM  Result Value Ref Range   Acetaminophen (Tylenol), Serum <10 (L) 10 - 30 ug/mL    Comment: (NOTE) Therapeutic concentrations vary significantly. A range of 10-30 ug/mL  may be an effective concentration for many patients. However, some  are best treated at concentrations outside of this range. Acetaminophen concentrations >150 ug/mL at 4 hours after ingestion  and >50 ug/mL at 12 hours after ingestion are  often associated with  toxic reactions.  Performed at [facility], [address] [place], [place] [address]   hCG, serum, qualitative     Status: None   Collection Time: 02/08/23 11:10 AM  Result Value Ref Range   Preg, Serum NEGATIVE NEGATIVE    Comment:        THE SENSITIVITY OF THIS METHODOLOGY IS >10 mIU/mL. Performed at [facility], [address] [place], [place] [address]   CBC     Status: Abnormal   Collection Time: 02/08/23 11:10 AM  Result Value Ref Range   WBC 4.1 4.0 - 10.5 K/uL   RBC 3.56 (L) 3.87 - 5.11 MIL/uL   Hemoglobin 7.9 (L) 12.0 - 15.0 g/dL    Comment: Reticulocyte Hemoglobin testing may be clinically indicated, consider ordering this additional test [id number]    HCT 27.2 (L) 36.0 - 46.0 %   MCV 76.4 (L) 80.0 - 100.0 fL   MCH 22.2 (L) 26.0 - 34.0 pg   MCHC 29.0 (L) 30.0 - 36.0 g/dL   RDW 18.8 (H) 41.6 - 60.6 %   Platelets 395 150 - 400 K/uL   nRBC 0.0 0.0 - 0.2 %    Comment: Performed at [facility], [address], [place], [place] [address]    Current Facility-Administered Medications  Medication Dose Route Frequency Provider Last Rate Last Admin   PARoxetine (PAXIL) tablet 20 mg  20 mg Oral Daily Lenox Ponds, NP       QUEtiapine (SEROQUEL) tablet 200 mg  200 mg Oral QHS Lenox Ponds, NP       Current Outpatient Medications  Medication Sig Dispense Refill   amLODipine (NORVASC) 10 MG tablet Take 1 tablet (10 mg total) by mouth daily. 30 tablet 2   ARIPiprazole ER (ABILIFY MAINTENA) 400 MG SRER injection Inject 2 mLs (400 mg total) into the muscle every 28 (twenty-eight) days. 1 each 0   aspirin EC 81 MG tablet Take  81 mg by mouth daily. Swallow whole.     ferrous sulfate 325 (65 FE) MG tablet Take 1 tablet (325 mg total) by mouth 3 (three) times daily with meals. 90 tablet 0   gabapentin (NEURONTIN) 100 MG capsule Take 1 capsule (100 mg total) by mouth 3 (three) times daily for pain scale 4-7. 45 capsule 1    losartan (COZAAR) 100 MG tablet Take 1 tablet (100 mg total) by mouth daily. 30 tablet 2   metFORMIN (GLUCOPHAGE) 500 MG tablet Take 1 tablet (500 mg total) by mouth 2 (two) times daily with a meal. 60 tablet 2   nicotine polacrilex (NICORETTE) 2 MG gum Take 1 each (2 mg total) by mouth every 2 (two) hours as needed for Smoking Cravings (Patient taking differently: Take 2 mg by mouth every 2 (two) hours as needed for smoking cessation.) 180 each 1   pantoprazole (PROTONIX) 40 MG tablet Take 1 tablet (40 mg total) by mouth daily. 30 tablet 2   potassium chloride SA (KLOR-CON M) 20 MEQ tablet Take one po bid x 3 days, then one po once a day 20 tablet 0   rosuvastatin (CRESTOR) 40 MG tablet Take 1 tablet (40 mg total) by mouth daily. 30 tablet 2    Musculoskeletal: Strength & Muscle Tone: decreased Gait & Station: unsteady Patient leans: N/A   Psychiatric Specialty Exam: Presentation  General Appearance:  Appropriate for Environment  Eye Contact: Good  Speech: Other (comment) (Mostly clear and coherent, some very mild poorness to articulation d/t history of stroke)  Speech Volume: Normal  Handedness: Right   Mood and Affect  Mood: Depressed; Anxious  Affect: Other (comment) (neutral to mildly constricted)   Thought Process  Thought Processes: Goal Directed; Linear; Coherent  Descriptions of Associations:Intact  Orientation:Full (Time, Place and Person)  Thought Content:Logical  History of Schizophrenia/Schizoaffective disorder:No  Duration of Psychotic Symptoms:Greater than six months  Hallucinations:Hallucinations: Visual; Auditory Description of Auditory Hallucinations: Reports voices that tell her she is ugly, no good, and to kill herself Description of Visual Hallucinations: Reports seeing people on the walls laughing at her, calling her names, telling her she is ugly  Ideas of Reference:None  Suicidal Thoughts:Suicidal Thoughts: Yes, Active SI Active  Intent and/or Plan: With Intent; With Plan; With Means to Carry Out; With Access to Means  Homicidal Thoughts:Homicidal Thoughts: No   Sensorium  Memory: Recent Good; Immediate Good; Remote Good  Judgment: Intact  Insight: Present   Executive Functions  Concentration: Good  Attention Span: Good  Recall: Good  Fund of Knowledge: Good  Language: Good   Psychomotor Activity  Psychomotor Activity: Psychomotor Activity: Normal   Assets  Assets: Communication Skills; Desire for Improvement; Financial Resources/Insurance; Housing; Social Support; Resilience; Leisure Time    Sleep  Sleep: Sleep: Poor   Physical Exam: Physical Exam Vitals and nursing note reviewed.  Constitutional:      General: She is not in acute distress.    Appearance: She is obese. She is not ill-appearing, toxic-appearing or diaphoretic.  Pulmonary:     Effort: Pulmonary effort is normal.  Skin:    General: Skin is warm and dry.  Neurological:     Mental Status: She is alert and oriented to person, place, and time.     Motor: Weakness present.     Comments: Left sided weakness d/t stroke  Psychiatric:        Attention and Perception: Attention normal. She perceives auditory (Reports hearing negative command auditory hallucinations telling her  to harm herself) and visual (Reports seeing individuals on the walls that are negative to her) hallucinations.        Mood and Affect: Mood is anxious and depressed.        Behavior: Behavior normal. Behavior is not agitated, slowed, aggressive, withdrawn, hyperactive or combative. Behavior is cooperative.        Thought Content: Thought content is not paranoid or delusional. Thought content includes suicidal (Reports active desire to lay on train tracks and end her life) ideation. Thought content does not include homicidal ideation. Thought content includes suicidal plan.        Cognition and Memory: Cognition and memory normal.        Judgment:  Judgment normal.    Review of Systems  Musculoskeletal:  Positive for myalgias.  Neurological:        Chronic left sided weakness   Psychiatric/Behavioral:  Positive for depression, hallucinations (Reports AVH), substance abuse (Cocaine and cannabis) and suicidal ideas (Reports active thoughts with plan and intent). The patient is nervous/anxious and has insomnia (Reports poor sleep).   All other systems reviewed and are negative.  Blood pressure (!) 175/95, pulse 66, temperature 98.3 F (36.8 C), temperature source Oral, resp. rate 18, SpO2 100%. There is no height or weight on file to calculate BMI.  Medical Decision Making:  Patient presented this encounter by way of EMS for decompensation of her mental health in the form of a recrudescence of active suicidal ideations with intent and plan to lay on train tracks near her home and attempt to end her life, auditory and visual hallucinations that are negative and command in nature, and worsening depression and anxiety.  Upon evaluation, patient affirms information provided to staff and her initial chief complaint, endorses to this writer that she has been having a decompensation of her overall mental health in the form of worsening depression anxiety, now active suicidal ideations, and auditory visual hallucinations that are negative and command in nature. Given the aforementioned endorsements of decompensation, will recommend inpatient hospitalization at this time for safety and stability of the patient.  CSW team will work on disposition at this time.  Psychiatry will continue to follow the patient, until disposition is obtained.  Will restart previous Paxil and Seroquel prescribed in the past recently that has been endorsed to be helpful to the patient.  Recommendations  #MDD (major depressive disorder), recurrent, severe, with psychosis (HCC) #Cannabis use disorder, moderate, dependence (HCC) #Polysubstance abuse (HCC) #Cocaine use  disorder, moderate, dependence (HCC)  -Recommend inpatient hospitalization at this time for mental health -Recommend restart Seroquel 200 mg po nightly -Recommend restart Paxil 20 mg p.o. daily -Recommend continue involuntary commitment -Recommend safety precautions -Recommend EKG for QTc monitoring, January 21, 2023 QTc appreciably 480  Disposition: Recommend psychiatric Inpatient admission when medically cleared.  Lenox Ponds, NP 02/08/2023 3:11 PM

## 2023-02-08 NOTE — ED Triage Notes (Signed)
Pt c/o SI starting this morning w/ plan to lay on the train tracks and auditory hallucinations x"a while."  Pt sts "the people in the walls are telling me to hurt myself."  Denies HI.      Pt reports she will not go to the [facility] because "the people there are mean to her."    Pt sts "they took me off my Seroquel, but I want to go back on it.  They now give me a shot."

## 2023-02-08 NOTE — Progress Notes (Signed)
LCSW Progress Note  [id number]   Kaitlyn Gray  02/08/2023  3:55 PM    Inpatient Behavioral Health Placement  Pt meets inpatient criteria per Arsenio Loader, NP. There are no available beds within [facility] system per [facility] Antoinette Cillo, RN. Referral was sent to the following facilities;   Destination  Service Provider Address Phone [facility]  [address] [place], [place] [address] [phone]  [facility]  [facility], [place] [phone]  [facility] [address]  [place] [address] [phone]  [facility]  [facility] [address]o Bellow [place] [address] [phone]  [facility]  [address] [place] [address] [phone]  [facility]  [address] [place], [place] [address] [phone]  [facility]  [address] [place] [address] [phone]  [facility]  [address], [place] [address] [phone]  [facility]  [place] [phone]  [facility]  [address] [place] [address] [phone]  [facility]  [address] [place] [address] [phone]  [facility]  [address] [place] [address] [phone]  [facility]  [address], [place] [address] [phone]  [facility]  [address] [place] [address] [phone]  [facility]  [address], [place] [address] [phone]  [facility]  [address], [place] [address] [phone]  [facility] Inpatient [place]  [place] [phone]  [facility]  [address] [place] [address] [phone]  [facility]  [address] [place] [address] [phone]  [facility] [address]  [address] [place] [address] [phone]  [facility]  [address] [place] [address] [phone]  [facility]  [address] [place] [address] [phone]  [facility] [place]  [address] [place] [address] [phone]  [facility]  [address] [place] [address] [phone]  [facility]  [address], [place] [phone]    Situation ongoing,  CSW will follow up.    Maryjean Ka, MSW, [facility] 02/08/2023 3:55 PM

## 2023-02-08 NOTE — ED Provider Notes (Signed)
[place] EMERGENCY DEPARTMENT AT [facility] Provider Note   CSN: [id number] Arrival date & time: 02/08/23  1039     History  Chief Complaint  Patient presents with   Suicidal   HPI Kaitlyn Gray is a 47 y.o. female with bipolar 1 disorder, MDD, chronic anemia, polysubstance abuse presenting for suicidal ideation.  States suicidal ideation has been intermittent for the past month but worse this morning.  States she does have a plan to lay her body over train tracks and attempt to end her life.  Also reporting visual and auditory hallucinations in the last couple weeks.  States there are "people on the walls that are telling me to hurt myself".  Denies homicidal ideation.  States she has been to been to [facility] for treatment but "the people are mean to me".  Also states that she was recently taken off her Seroquel about a month ago and now on Abilify monthly.  Patient is requesting to be back on her Seroquel.  HPI     Home Medications Prior to Admission medications   Medication Sig Start Date End Date Taking? Authorizing Provider  amLODipine (NORVASC) 10 MG tablet Take 1 tablet (10 mg total) by mouth daily. 06/04/22 12/24/22  [place], Alvira Philips, DO  ARIPiprazole ER (ABILIFY MAINTENA) 400 MG SRER injection Inject 2 mLs (400 mg total) into the muscle every 28 (twenty-eight) days. 12/23/22   Rankin, Shuvon B, NP  aspirin EC 81 MG tablet Take 81 mg by mouth daily. Swallow whole.    [provider]  ferrous sulfate 325 (65 FE) MG tablet Take 1 tablet (325 mg total) by mouth 3 (three) times daily with meals. 01/21/23 04/21/23  Cathren Laine, MD  gabapentin (NEURONTIN) 100 MG capsule Take 1 capsule (100 mg total) by mouth 3 (three) times daily for pain scale 4-7. 12/01/22     losartan (COZAAR) 100 MG tablet Take 1 tablet (100 mg total) by mouth daily. 06/04/22 12/24/22  [place], Alvira Philips, DO  metFORMIN (GLUCOPHAGE) 500 MG tablet Take 1 tablet (500 mg total) by mouth 2 (two) times daily  with a meal. 06/04/22 12/24/22  [place], Alvira Philips, DO  nicotine polacrilex (NICORETTE) 2 MG gum Take 1 each (2 mg total) by mouth every 2 (two) hours as needed for Smoking Cravings Patient taking differently: Take 2 mg by mouth every 2 (two) hours as needed for smoking cessation. 12/01/22     pantoprazole (PROTONIX) 40 MG tablet Take 1 tablet (40 mg total) by mouth daily. 06/04/22 12/24/22  [place], Alvira Philips, DO  potassium chloride SA (KLOR-CON M) 20 MEQ tablet Take one po bid x 3 days, then one po once a day 01/21/23   Cathren Laine, MD  rosuvastatin (CRESTOR) 40 MG tablet Take 1 tablet (40 mg total) by mouth daily. 06/04/22 12/24/22  [place], Eric J, DO      Allergies    Tomato, Pork-derived products, Hydrocodone, and Latex    Review of Systems   See HPI for pertinent positives  Physical Exam Updated Vital Signs BP (!) 175/95 (BP Location: Right Arm)   Pulse 66   Temp 98.3 F (36.8 C) (Oral)   Resp 18   SpO2 100%  Physical Exam Vitals and nursing note reviewed.  HENT:     Head: Normocephalic and atraumatic.     Mouth/Throat:     Mouth: Mucous membranes are moist.  Eyes:     General:        Right eye: No  discharge.        Left eye: No discharge.     Conjunctiva/sclera: Conjunctivae normal.  Cardiovascular:     Rate and Rhythm: Normal rate and regular rhythm.     Pulses: Normal pulses.     Heart sounds: Normal heart sounds.  Pulmonary:     Effort: Pulmonary effort is normal.     Breath sounds: Normal breath sounds.  Abdominal:     General: Abdomen is flat.     Palpations: Abdomen is soft.  Skin:    General: Skin is warm and dry.  Neurological:     General: No focal deficit present.  Psychiatric:        Mood and Affect: Mood normal.        Behavior: Behavior is cooperative.     ED Results / Procedures / Treatments   Labs (all labs ordered are listed, but only abnormal results are displayed) Labs Reviewed  COMPREHENSIVE METABOLIC PANEL - Abnormal; Notable for the  following components:      Result Value   Glucose, Bld 102 (*)    Albumin 3.4 (*)    All other components within normal limits  SALICYLATE LEVEL - Abnormal; Notable for the following components:   Salicylate Lvl <7.0 (*)    All other components within normal limits  ACETAMINOPHEN LEVEL - Abnormal; Notable for the following components:   Acetaminophen (Tylenol), Serum <10 (*)    All other components within normal limits  CBC - Abnormal; Notable for the following components:   RBC 3.56 (*)    Hemoglobin 7.9 (*)    HCT 27.2 (*)    MCV 76.4 (*)    MCH 22.2 (*)    MCHC 29.0 (*)    RDW 18.5 (*)    All other components within normal limits  ETHANOL  HCG, SERUM, QUALITATIVE  RAPID URINE DRUG SCREEN, HOSP PERFORMED    EKG None  Radiology No results found.  Procedures Procedures    Medications Ordered in ED Medications  QUEtiapine (SEROQUEL) tablet 200 mg (has no administration in time range)  PARoxetine (PAXIL) tablet 20 mg (has no administration in time range)    ED Course/ Medical Decision Making/ A&P                                 Medical Decision Making Amount and/or Complexity of Data Reviewed Labs: ordered.   47 year old well-appearing female presenting for suicidal ideation. Exam unremarkable. Workup overall reassuring. Medically cleared. IVC for suicidal ideation with plan. Placed in psych hold.         Final Clinical Impression(s) / ED Diagnoses Final diagnoses:  Suicidal ideation    Rx / DC Orders ED Discharge Orders     None         Gareth Eagle, PA-C 02/08/23 1620    Melene Plan, DO 02/09/23 [phone]

## 2023-02-08 NOTE — Progress Notes (Addendum)
Pt was accepted to [facility] 02/09/2023; Bed Assignment Main Campus PENDING negative COVID faxed to [facility] Fax Number: [phone] (Adult)   Pt meets inpatient criteria per Shearon Stalls     Attending Physician will be Dr. Loni Beckwith   Report can be called to:[phone]-Pager number, please leave a returned phone number to receive a phone call back.   Pt can arrive after 9:00am   Care Team notified: Day [facility] Sharyne Peach, RN, Arsenio Loader, NP, April Smith,RN  Maryjean Ka, MSW, [facility] 02/08/2023 4:21 PM

## 2023-02-08 NOTE — ED Notes (Signed)
Pt was accepted to [facility] 02/09/2023; Bed Assignment Main Campus PENDING negative COVID faxed to [facility] Fax Number: [phone] (Adult)   Pt meets inpatient criteria per Shearon Stalls   Attending Physician will be Dr. Loni Beckwith   Report can be called to:[phone]-Pager number, please leave a returned phone number to receive a phone call back.   Pt can arrive after 9:00am   Care Team notified: Day [facility] Sharyne Peach, RN, Arsenio Loader, NP, Hazleigh Mccleave,RN

## 2023-02-08 NOTE — ED Notes (Signed)
Pt has been changed and wanded by [employer].

## 2023-02-08 NOTE — ED Notes (Signed)
Pt has IVC papers filled out by MD. Order placed at this time for 1:1 monitoring.

## 2023-02-09 ENCOUNTER — Other Ambulatory Visit: Payer: Self-pay

## 2023-02-09 ENCOUNTER — Encounter (HOSPITAL_COMMUNITY): Payer: Self-pay | Admitting: Psychiatry

## 2023-02-09 ENCOUNTER — Inpatient Hospital Stay (HOSPITAL_COMMUNITY)
Admission: AD | Admit: 2023-02-09 | Discharge: 2023-02-12 | DRG: 885 | Disposition: A | Discharge status: Home / Self Care | Payer: MEDICAID | Source: Intra-hospital | Attending: Psychiatry | Admitting: Psychiatry

## 2023-02-09 DIAGNOSIS — F17210 Nicotine dependence, cigarettes, uncomplicated: Secondary | ICD-10-CM | POA: Diagnosis present

## 2023-02-09 DIAGNOSIS — Z91148 Patient's other noncompliance with medication regimen for other reason: Secondary | ICD-10-CM

## 2023-02-09 DIAGNOSIS — F419 Anxiety disorder, unspecified: Secondary | ICD-10-CM | POA: Diagnosis present

## 2023-02-09 DIAGNOSIS — S0093XA Contusion of unspecified part of head, initial encounter: Secondary | ICD-10-CM | POA: Diagnosis present

## 2023-02-09 DIAGNOSIS — F4324 Adjustment disorder with disturbance of conduct: Secondary | ICD-10-CM | POA: Diagnosis present

## 2023-02-09 DIAGNOSIS — Z7982 Long term (current) use of aspirin: Secondary | ICD-10-CM | POA: Diagnosis not present

## 2023-02-09 DIAGNOSIS — E7800 Pure hypercholesterolemia, unspecified: Secondary | ICD-10-CM | POA: Diagnosis present

## 2023-02-09 DIAGNOSIS — Z91014 Allergy to mammalian meats: Secondary | ICD-10-CM

## 2023-02-09 DIAGNOSIS — I69322 Dysarthria following cerebral infarction: Secondary | ICD-10-CM

## 2023-02-09 DIAGNOSIS — F333 Major depressive disorder, recurrent, severe with psychotic symptoms: Secondary | ICD-10-CM | POA: Diagnosis not present

## 2023-02-09 DIAGNOSIS — Z9152 Personal history of nonsuicidal self-harm: Secondary | ICD-10-CM | POA: Diagnosis not present

## 2023-02-09 DIAGNOSIS — F1410 Cocaine abuse, uncomplicated: Secondary | ICD-10-CM | POA: Diagnosis present

## 2023-02-09 DIAGNOSIS — Z8249 Family history of ischemic heart disease and other diseases of the circulatory system: Secondary | ICD-10-CM

## 2023-02-09 DIAGNOSIS — F1210 Cannabis abuse, uncomplicated: Secondary | ICD-10-CM | POA: Diagnosis present

## 2023-02-09 DIAGNOSIS — Z5982 Transportation insecurity: Secondary | ICD-10-CM

## 2023-02-09 DIAGNOSIS — F4310 Post-traumatic stress disorder, unspecified: Secondary | ICD-10-CM | POA: Diagnosis present

## 2023-02-09 DIAGNOSIS — Z9181 History of falling: Secondary | ICD-10-CM

## 2023-02-09 DIAGNOSIS — Z7984 Long term (current) use of oral hypoglycemic drugs: Secondary | ICD-10-CM | POA: Diagnosis not present

## 2023-02-09 DIAGNOSIS — F1910 Other psychoactive substance abuse, uncomplicated: Secondary | ICD-10-CM | POA: Diagnosis present

## 2023-02-09 DIAGNOSIS — Z9151 Personal history of suicidal behavior: Secondary | ICD-10-CM

## 2023-02-09 DIAGNOSIS — Z79899 Other long term (current) drug therapy: Secondary | ICD-10-CM | POA: Diagnosis not present

## 2023-02-09 DIAGNOSIS — W19XXXA Unspecified fall, initial encounter: Secondary | ICD-10-CM | POA: Diagnosis present

## 2023-02-09 DIAGNOSIS — I10 Essential (primary) hypertension: Secondary | ICD-10-CM | POA: Diagnosis present

## 2023-02-09 DIAGNOSIS — F1220 Cannabis dependence, uncomplicated: Secondary | ICD-10-CM | POA: Diagnosis not present

## 2023-02-09 DIAGNOSIS — F250 Schizoaffective disorder, bipolar type: Principal | ICD-10-CM

## 2023-02-09 DIAGNOSIS — Z885 Allergy status to narcotic agent status: Secondary | ICD-10-CM

## 2023-02-09 DIAGNOSIS — Z91040 Latex allergy status: Secondary | ICD-10-CM

## 2023-02-09 DIAGNOSIS — Z91018 Allergy to other foods: Secondary | ICD-10-CM

## 2023-02-09 DIAGNOSIS — Z5941 Food insecurity: Secondary | ICD-10-CM

## 2023-02-09 DIAGNOSIS — Y939 Activity, unspecified: Secondary | ICD-10-CM

## 2023-02-09 DIAGNOSIS — F1420 Cocaine dependence, uncomplicated: Secondary | ICD-10-CM | POA: Diagnosis not present

## 2023-02-09 DIAGNOSIS — F259 Schizoaffective disorder, unspecified: Secondary | ICD-10-CM

## 2023-02-09 LAB — RAPID URINE DRUG SCREEN, HOSP PERFORMED
Amphetamines: NOT DETECTED
Barbiturates: NOT DETECTED
Benzodiazepines: NOT DETECTED
Cocaine: POSITIVE — AB
Opiates: NOT DETECTED
Tetrahydrocannabinol: NOT DETECTED

## 2023-02-09 MED ORDER — GABAPENTIN 100 MG PO CAPS
100.0000 mg | ORAL_CAPSULE | Freq: Three times a day (TID) | ORAL | Status: DC
  Administered 2023-02-09 – 2023-02-12 (×7): 100 mg via ORAL
  Filled 2023-02-09 (×14): qty 1

## 2023-02-09 MED ORDER — TRAZODONE HCL 50 MG PO TABS
50.0000 mg | ORAL_TABLET | Freq: Every evening | ORAL | Status: DC | PRN
  Administered 2023-02-09: 50 mg via ORAL
  Filled 2023-02-09: qty 1

## 2023-02-09 MED ORDER — DIPHENHYDRAMINE HCL 25 MG PO CAPS
50.0000 mg | ORAL_CAPSULE | Freq: Three times a day (TID) | ORAL | Status: DC | PRN
  Administered 2023-02-10: 50 mg via ORAL
  Filled 2023-02-09: qty 2

## 2023-02-09 MED ORDER — DIPHENHYDRAMINE HCL 50 MG/ML IJ SOLN: 50.0000 mg | Freq: Three times a day (TID) | INTRAMUSCULAR | Status: DC | PRN

## 2023-02-09 MED ORDER — METFORMIN HCL 500 MG PO TABS
500.0000 mg | ORAL_TABLET | Freq: Two times a day (BID) | ORAL | Status: DC
  Administered 2023-02-09 – 2023-02-12 (×5): 500 mg via ORAL
  Filled 2023-02-09 (×8): qty 1

## 2023-02-09 MED ORDER — ENSURE ENLIVE PO LIQD
237.0000 mL | Freq: Two times a day (BID) | ORAL | Status: DC
  Administered 2023-02-09 – 2023-02-12 (×4): 237 mL via ORAL
  Filled 2023-02-09 (×7): qty 237

## 2023-02-09 MED ORDER — HALOPERIDOL LACTATE 5 MG/ML IJ SOLN: 5.0000 mg | Freq: Three times a day (TID) | INTRAMUSCULAR | Status: DC | PRN

## 2023-02-09 MED ORDER — ASPIRIN 81 MG PO TBEC
81.0000 mg | DELAYED_RELEASE_TABLET | Freq: Every day | ORAL | Status: DC
  Administered 2023-02-09 – 2023-02-12 (×4): 81 mg via ORAL
  Filled 2023-02-09 (×6): qty 1

## 2023-02-09 MED ORDER — LORAZEPAM 1 MG PO TABS
2.0000 mg | ORAL_TABLET | Freq: Three times a day (TID) | ORAL | Status: DC | PRN
  Administered 2023-02-10: 2 mg via ORAL
  Filled 2023-02-09: qty 2

## 2023-02-09 MED ORDER — MAGNESIUM HYDROXIDE 400 MG/5ML PO SUSP: 30.0000 mL | Freq: Every day | ORAL | Status: DC | PRN

## 2023-02-09 MED ORDER — PANTOPRAZOLE SODIUM 40 MG PO TBEC
40.0000 mg | DELAYED_RELEASE_TABLET | Freq: Every day | ORAL | Status: DC
  Administered 2023-02-09 – 2023-02-12 (×4): 40 mg via ORAL
  Filled 2023-02-09 (×6): qty 1

## 2023-02-09 MED ORDER — HYDROXYZINE HCL 25 MG PO TABS
25.0000 mg | ORAL_TABLET | Freq: Three times a day (TID) | ORAL | Status: DC | PRN
  Administered 2023-02-09 – 2023-02-11 (×3): 25 mg via ORAL
  Filled 2023-02-09 (×3): qty 1

## 2023-02-09 MED ORDER — HYDROXYZINE HCL 25 MG PO TABS
25.0000 mg | ORAL_TABLET | Freq: Three times a day (TID) | ORAL | Status: DC | PRN
  Administered 2023-02-09: 25 mg via ORAL
  Filled 2023-02-09: qty 1

## 2023-02-09 MED ORDER — HALOPERIDOL 5 MG PO TABS
5.0000 mg | ORAL_TABLET | Freq: Three times a day (TID) | ORAL | Status: DC | PRN
  Administered 2023-02-10: 5 mg via ORAL
  Filled 2023-02-09: qty 1

## 2023-02-09 MED ORDER — QUETIAPINE FUMARATE 200 MG PO TABS
200.0000 mg | ORAL_TABLET | Freq: Every day | ORAL | Status: DC
  Administered 2023-02-09: 200 mg via ORAL
  Filled 2023-02-09 (×3): qty 1

## 2023-02-09 MED ORDER — ARIPIPRAZOLE ER 400 MG IM SRER
400.0000 mg | INTRAMUSCULAR | Status: DC
  Filled 2023-02-09: qty 2

## 2023-02-09 MED ORDER — LOSARTAN POTASSIUM 50 MG PO TABS
100.0000 mg | ORAL_TABLET | Freq: Every day | ORAL | Status: DC
  Administered 2023-02-09 – 2023-02-12 (×4): 100 mg via ORAL
  Filled 2023-02-09 (×6): qty 2

## 2023-02-09 MED ORDER — ROSUVASTATIN CALCIUM 40 MG PO TABS
40.0000 mg | ORAL_TABLET | Freq: Every day | ORAL | Status: DC
  Administered 2023-02-09 – 2023-02-12 (×4): 40 mg via ORAL
  Filled 2023-02-09 (×3): qty 1
  Filled 2023-02-09: qty 2
  Filled 2023-02-09 (×2): qty 1

## 2023-02-09 MED ORDER — ALUM & MAG HYDROXIDE-SIMETH 200-200-20 MG/5ML PO SUSP
30.0000 mL | ORAL | Status: DC | PRN
  Administered 2023-02-10: 30 mL via ORAL
  Filled 2023-02-09: qty 30

## 2023-02-09 MED ORDER — LORAZEPAM 2 MG/ML IJ SOLN: 2.0000 mg | Freq: Three times a day (TID) | INTRAMUSCULAR | Status: DC | PRN

## 2023-02-09 MED ORDER — ACETAMINOPHEN 325 MG PO TABS: 650.0000 mg | ORAL_TABLET | Freq: Four times a day (QID) | ORAL | Status: DC | PRN

## 2023-02-09 MED ORDER — AMLODIPINE BESYLATE 10 MG PO TABS
10.0000 mg | ORAL_TABLET | Freq: Every day | ORAL | Status: DC
  Administered 2023-02-09 – 2023-02-12 (×4): 10 mg via ORAL
  Filled 2023-02-09 (×6): qty 1

## 2023-02-09 MED ORDER — PAROXETINE HCL 20 MG PO TABS
20.0000 mg | ORAL_TABLET | Freq: Every day | ORAL | Status: DC
  Administered 2023-02-10 – 2023-02-12 (×3): 20 mg via ORAL
  Filled 2023-02-09 (×4): qty 1

## 2023-02-09 MED ORDER — FERROUS SULFATE 325 (65 FE) MG PO TABS
325.0000 mg | ORAL_TABLET | Freq: Three times a day (TID) | ORAL | Status: DC
  Administered 2023-02-09 – 2023-02-12 (×7): 325 mg via ORAL
  Filled 2023-02-09 (×12): qty 1

## 2023-02-09 NOTE — BHH Group Notes (Signed)
Spiritual care group on grief and loss facilitated by Chaplain Dyanne Carrel, B[facility]  Group Goal: Support / Education around grief and loss  Members engage in facilitated group support and psycho-social education.  Group Description:  Following introductions and group rules, group members engaged in facilitated group dialogue and support around topic of loss, with particular support around experiences of loss in their lives. Group Identified types of loss (relationships / self / things) and identified patterns, circumstances, and changes that precipitate losses. Reflected on thoughts / feelings around loss, normalized grief responses, and recognized variety in grief experience. Group encouraged individual reflection on safe space and on the coping skills that they are already utilizing.  Group drew on Adlerian / Rogerian and narrative framework  Patient Progress: Kaitlyn Gray arrived partway through group.  She shared about the loss of her grandmother who died from a gunshot wound several years back and stated that her depression started at that time. Chaplain will invite her to speak individually as well.

## 2023-02-09 NOTE — ED Notes (Signed)
Pt request to be sent to another facility besides [facility], pt states previous bad experience at said place. Pt states she is no longer SI and would go to any facility besides [facility]. Voiced to pt that message would be send to doctor and that at this time there are no beds available at other facilities.

## 2023-02-09 NOTE — Progress Notes (Addendum)
Pt has been accepted to [facility] TODAY 02/09/2023, pending updated BP and EKG. Bed assignment: 406-2  Pt meets inpatient criteria per Alona Bene, PMHNP  Attending Physician will be Phineas Inches, MD  Report can be called to: - Adult unit: [phone]  Pt can arrive after pending items are received; pending discharges  Care Team Notified: [facility] Rona Ravens, RN, Myrlene Broker, RN, Bobette Mo, RN, and [facility], PMHNP  [place], [place]  02/09/2023 10:19 AM

## 2023-02-09 NOTE — ED Notes (Signed)
[employer] [facility] called for Pt going to [facility]

## 2023-02-09 NOTE — ED Notes (Signed)
Pt sobbing stating she does not want to go to [facility]. Voiced to patient that there are no available beds available and that she will have to go to [facility] in view of the pt at this time.

## 2023-02-09 NOTE — ED Provider Notes (Signed)
Emergency Medicine Observation Re-evaluation Note  Kaitlyn Gray is a 47 y.o. female, seen on rounds today.  Pt initially presented to the ED for complaints of Suicidal Currently, the patient is calm in the room.  Physical Exam  BP (!) 176/90 (BP Location: Right Arm)   Pulse 65   Temp 98.2 F (36.8 C) (Axillary)   Resp 16   SpO2 100%  Physical Exam HENT:     Head: Normocephalic.  Cardiovascular:     Rate and Rhythm: Normal rate.  Pulmonary:     Effort: Pulmonary effort is normal.  Neurological:     Mental Status: She is alert.  Psychiatric:     Comments: Endorses suicidal ideation      ED Course / MDM  EKG:   I have reviewed the labs performed to date as well as medications administered while in observation.  Recent changes in the last 24 hours include a disposition plan, which includes hospitalization at [facility].  Patient currently waiting for transportation.  Plan  Current plan is for admission at [facility].   Anders Simmonds T, DO 02/09/23 1000

## 2023-02-09 NOTE — ED Notes (Signed)
Breakfast provided.

## 2023-02-09 NOTE — ED Provider Notes (Signed)
Pt accepted at [facility], Dr Sherron Flemings.  Pt alert, content, nad. Vitals normal.   Pt appears stable for admission to [facility] and transport there.    Cathren Laine, MD 02/09/23 1210

## 2023-02-09 NOTE — ED Notes (Signed)
Pt asked about dispo planned and updated on plan for possible admission to [facility] and pt became tearful and upset. Pt began yelling and stating "I don't like it there the staff is not nice and they were mean to me. I am scared to go there and I have a choice." Pt asking to leave but informed of the IVC order that is in place at this time. Pt went back into room and is lying on her bed.

## 2023-02-09 NOTE — ED Notes (Signed)
Patient walked up to nurses station requesting to go home. She states that she really needs to go to her apartment because her landlord is having an inspection today and she's afraid they will kick her out because she hasn't cleaned it and its bad enough that they'll evict her.

## 2023-02-09 NOTE — Plan of Care (Signed)
  Problem: Education: Goal: Knowledge of [place] [facility] Education information/materials will improve Outcome: Progressing Goal: Emotional status will improve Outcome: Progressing Goal: Mental status will improve Outcome: Progressing Goal: Verbalization of understanding the information provided will improve Outcome: Progressing   Problem: Activity: Goal: Interest or engagement in activities will improve Outcome: Progressing Goal: Sleeping patterns will improve Outcome: Progressing   Problem: Coping: Goal: Ability to verbalize frustrations and anger appropriately will improve Outcome: Progressing Goal: Ability to demonstrate self-control will improve Outcome: Progressing   Problem: Health Behavior/Discharge Planning: Goal: Identification of resources available to assist in meeting health care needs will improve Outcome: Progressing Goal: Compliance with treatment plan for underlying cause of condition will improve Outcome: Progressing

## 2023-02-09 NOTE — ED Notes (Signed)
Voice message left for [facility] to make sure that pt can be transported to [facility] as planned.

## 2023-02-09 NOTE — ED Notes (Signed)
Report given to [facility]

## 2023-02-09 NOTE — Tx Team (Signed)
Initial Treatment Plan 02/09/2023 2:19 PM [name] ION:[id number]    PATIENT STRESSORS: Financial difficulties   Health problems     PATIENT STRENGTHS: Motivation for treatment/growth    PATIENT IDENTIFIED PROBLEMS: "Suicidal ideation"  "Voices tell me that I'm not worth living"   "Need to restart seroquel which helped the voices go away."                 DISCHARGE CRITERIA:  Improved stabilization in mood, thinking, and/or behavior Motivation to continue treatment in a less acute level of care  PRELIMINARY DISCHARGE PLAN: Outpatient therapy  PATIENT/FAMILY INVOLVEMENT: This treatment plan has been presented to and reviewed with the patient, Kaitlyn Gray.  The patient has been given the opportunity to ask questions and make suggestions.  Karn Pickler, RN 02/09/2023, 2:19 PM

## 2023-02-09 NOTE — Progress Notes (Signed)
Admission note:  Patient is 47 y.o. IVC'd from [facility] for suicidal ideation to lay down in the train tracks. Patient says she has heard voices telling her that "I am ugly and not worth anything." She also endorsed VH yesterday at the hospital in the room. She did not want to go inside the room because there were people in the wall. Patient currently denies SI, HI and AVH. Patient states that seroquel helped her a lot and that when she got switched to abilify injection it made her "slow and not want to do anything." Patient requests to be put back on seroquel long term.   Patient has a history of hypertension and stroke. Patient ambulates with a walker and experiences incontinence.  Skin assessment was performed with Turi, MHT and noted healed superficial cuts on left forearm, the tops of thighs and abdomen. Patient says these were self-inflicted and "felt better than laying in the train tracks." Patient packet and meal provided. Patient oriented to the unit. Q 15 minute safety checks initiated.

## 2023-02-10 DIAGNOSIS — F250 Schizoaffective disorder, bipolar type: Secondary | ICD-10-CM | POA: Diagnosis not present

## 2023-02-10 DIAGNOSIS — F259 Schizoaffective disorder, unspecified: Secondary | ICD-10-CM

## 2023-02-10 LAB — GLUCOSE, CAPILLARY
Glucose-Capillary: 130 mg/dL — ABNORMAL HIGH (ref 70–99)
Glucose-Capillary: 94 mg/dL (ref 70–99)

## 2023-02-10 MED ORDER — INSULIN ASPART 100 UNIT/ML IJ SOLN: 0.0000 [IU] | Freq: Every day | INTRAMUSCULAR | Status: DC

## 2023-02-10 MED ORDER — INSULIN ASPART 100 UNIT/ML IJ SOLN: 0.0000 [IU] | Freq: Three times a day (TID) | INTRAMUSCULAR | Status: DC

## 2023-02-10 MED ORDER — ARIPIPRAZOLE 5 MG PO TABS
5.0000 mg | ORAL_TABLET | Freq: Once | ORAL | Status: AC
  Administered 2023-02-10: 5 mg via ORAL
  Filled 2023-02-10 (×2): qty 1

## 2023-02-10 NOTE — Group Note (Signed)
Date:  02/10/2023 Time:  9:31 PM  Group Topic/Focus:  Wrap-Up Group:   The focus of this group is to help patients review their daily goal of treatment and discuss progress on daily workbooks.    Participation Level:  Active  Participation Quality:  Appropriate, Redirectable, and Sharing  Affect:  Appropriate  Cognitive:  Appropriate and Disorganized  Insight: Appropriate  Engagement in Group:  Engaged and Off Topic  Modes of Intervention:  Discussion and Socialization  Additional Comments:  The group started off with an ice breaker: "what is your favorite session and why?" The patient stated that her favorite session is winter because of all the holidays associated with the session and coming. The patient did begin to go of topic. The patient stated that she had an up and down type of day. The patient stated issues with roommate, being at the hospital and not getting medication for sleep. The patient stated that her goal for tomorrow is to have a better day that today. The patient rated her day a 3 out of 10.  Kennieth Francois 02/10/2023, 9:31 PM

## 2023-02-10 NOTE — ED Notes (Signed)
2 attempts have been made to speak with the [facility] and each time I was sent to VM.

## 2023-02-10 NOTE — Discharge Instructions (Signed)
Return if any problems.

## 2023-02-10 NOTE — Plan of Care (Signed)
  Problem: Education: Goal: Knowledge of [place] [facility] Education information/materials will improve Outcome: Not Progressing Goal: Verbalization of understanding the information provided will improve Outcome: Not Progressing

## 2023-02-10 NOTE — Progress Notes (Signed)
   02/09/23 2124  Psych Admission Type (Psych Patients Only)  Admission Status Involuntary  Psychosocial Assessment  Patient Complaints Anxiety;Depression  Eye Contact Fair  Facial Expression Flat  Affect Depressed;Anxious;Sad  Speech Slurred  Interaction Assertive  Motor Activity Slow  Appearance/Hygiene Poor hygiene;In hospital gown  Behavior Characteristics Cooperative  Mood Depressed;Anxious  Thought Process  Coherency WDL  Content WDL  Delusions None reported or observed  Perception Hallucinations  Hallucination Auditory;Visual  Judgment Poor  Confusion None  Danger to Self  Current suicidal ideation? Denies  Agreement Not to Harm Self Yes  Description of Agreement verbal  Danger to Others  Danger to Others None reported or observed

## 2023-02-10 NOTE — Progress Notes (Signed)
Patient observed showing another patient a crack pipe. Mht reported to the nurse and nurse with 2 mhts approached patient in dayroom and asked for the crack pipe to be surrendered. Patient removed the pipe from her left socks and gave it to the nurse. Patient consented for a search, which was completed in her room with help of two MHTs. Room search was completed no other contraband was found on patient and in room. Charge nurse, [facility] and Nps made aware and crack pipe was given to security staff.
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
## 2023-02-10 NOTE — ED Notes (Signed)
Safe transport called at this time. 

## 2023-02-10 NOTE — Progress Notes (Signed)
Dar Note: Patient is alert and oriented to person, place and time.  Patient loss her balance and fell in her room unwitnessed.  Stated she hit her head and her hip hurts.  Vital signs result within normal limit.  Patient is being transferred to [facility] for evaluation. Report called to charge nurse.

## 2023-02-10 NOTE — Plan of Care (Cosign Needed)
Notified by nursing staff that patient had unwitnessed fall.  She reports that she hit her head when she landed.  Discussed with her that she would need to be evaluated in the ED.   Called [facility] and spoke with Dr. Estell Harpin.  Discussed patient's presentation and the need for medical clearance.  Discussed that once cleared she could be returned to [facility] and he accepted the patient.      Arna Snipe MD Resident
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
## 2023-02-10 NOTE — Progress Notes (Signed)
Dar Note: Patient returned from [facility] for evaluation.  Patient is alert and oriented x 4.  Patient ambulatory on the unit with a walker.  Denies any pain or discomfort.  Vital signs obtained and result within normal limit.  Support and encouragement offered as needed.  Routine safety checks maintained.  Patient is safe on and off the unit.

## 2023-02-10 NOTE — Progress Notes (Signed)
Patient became agitated d/t staff taking a contraband from her and started yelling saying I want to sleep. Patient became disruptive of the unit and was given her prn medications  and were effective.

## 2023-02-10 NOTE — BHH Group Notes (Signed)
Adult Psychoeducational Group Note  Date:  02/10/2023 Time:  12:23 AM  Group Topic/Focus:  Wrap-Up Group:   The focus of this group is to help patients review their daily goal of treatment and discuss progress on daily workbooks.  Participation Level:  Did Not Attend  Participation Quality:    Affect:    Cognitive:    Insight:   Engagement in Group:    Modes of Intervention:    Additional Comments:  Pt did not attend group  Satira Anis 02/10/2023, 12:23 AM

## 2023-02-10 NOTE — Progress Notes (Signed)
This Clinical research associate seen pt pull out a drug pipe in the dayroom to show another pt.  This Clinical research associate made  [employer], Press photographer, and pt RN aware. A body search was conducted with other female tech. Also, conducted another environmental search w/ pt RN.  No additional findings.

## 2023-02-10 NOTE — Progress Notes (Signed)
   02/10/23 0554  15 Minute Checks  Location Bedroom  Visual Appearance Calm  Behavior Sleeping  Sleep (Behavioral Health Patients Only)  Calculate sleep? (Click Yes once per 24 hr at 0600 safety check) Yes  Documented sleep last 24 hours 7.25

## 2023-02-10 NOTE — ED Notes (Signed)
Safe transport arrived for transport back to [facility]

## 2023-02-10 NOTE — Progress Notes (Signed)
  Administered PRN Trazodone per MAR per patient request. 

## 2023-02-10 NOTE — ED Notes (Addendum)
Pt arrived via EMS, from [facility], witnessed fall. Walking with walker, hit head. No LOC. No thinners. Offers no complaints. Sitter at bedside.
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
## 2023-02-10 NOTE — ED Provider Notes (Signed)
[place] EMERGENCY DEPARTMENT AT [facility] Provider Note   CSN: [id number] Arrival date & time: 02/10/23  1053     History  Chief Complaint  Patient presents with   Kaitlyn Gray    [name] is a 47 y.o. female.  Patient complains of falling.  Patient states that she told staff that she hit her head but that she did not lose consciousness.  Patient states that she does not have a headache she does not have any pain in her head.  Tells me that she does not have any area of injury or bruising.  Patient states they made her come here for evaluation because of hitting her head.  Patient denies any other area of injury.  Patient denies any visual changes she denies any hearing changes.  She is not having headache.  Patient denies any injury to her chest abdomen arms or legs.  She has been able to ambulate without difficulty.  The history is provided by the patient. No language interpreter was used.  Fall This is a new problem. The current episode started less than 1 hour ago. The problem occurs constantly. Pertinent negatives include no chest pain and no abdominal pain.       Home Medications Prior to Admission medications   Medication Sig Start Date End Date Taking? Authorizing Provider  amLODipine (NORVASC) 10 MG tablet Take 1 tablet (10 mg total) by mouth daily. 06/04/22 12/24/22  [place], Alvira Philips, DO  ARIPiprazole ER (ABILIFY MAINTENA) 400 MG SRER injection Inject 2 mLs (400 mg total) into the muscle every 28 (twenty-eight) days. 12/23/22   Rankin, Shuvon B, NP  aspirin EC 81 MG tablet Take 81 mg by mouth daily. Swallow whole.    [provider]  ferrous sulfate 325 (65 FE) MG tablet Take 1 tablet (325 mg total) by mouth 3 (three) times daily with meals. 01/21/23 04/21/23  Cathren Laine, MD  gabapentin (NEURONTIN) 100 MG capsule Take 1 capsule (100 mg total) by mouth 3 (three) times daily for pain scale 4-7. 12/01/22     losartan (COZAAR) 100 MG tablet Take 1 tablet  (100 mg total) by mouth daily. 06/04/22 02/09/23  [place], Alvira Philips, DO  metFORMIN (GLUCOPHAGE) 500 MG tablet Take 1 tablet (500 mg total) by mouth 2 (two) times daily with a meal. 06/04/22 12/24/22  [place], Alvira Philips, DO  nicotine polacrilex (NICORETTE) 2 MG gum Take 1 each (2 mg total) by mouth every 2 (two) hours as needed for Smoking Cravings Patient taking differently: Take 2 mg by mouth every 2 (two) hours as needed for smoking cessation. 12/01/22     pantoprazole (PROTONIX) 40 MG tablet Take 1 tablet (40 mg total) by mouth daily. 06/04/22 12/24/22  [place], Alvira Philips, DO  rosuvastatin (CRESTOR) 40 MG tablet Take 1 tablet (40 mg total) by mouth daily. 06/04/22 12/24/22  [place], Eric J, DO      Allergies    Tomato, Pork-derived products, Hydrocodone, and Latex    Review of Systems   Review of Systems  Cardiovascular:  Negative for chest pain.  Gastrointestinal:  Negative for abdominal pain.  All other systems reviewed and are negative.   Physical Exam Updated Vital Signs BP (!) 158/87 (BP Location: Right Arm)   Pulse 66   Temp 98.1 F (36.7 C) (Oral)   Resp 18   Ht 5\' 3"  (1.6 m)   Wt 75.6 kg   SpO2 100%   BMI 29.51 kg/m  Physical Exam Vitals and  nursing note reviewed.  Constitutional:      Appearance: She is well-developed.  HENT:     Head: Normocephalic.     Nose: Nose normal.     Mouth/Throat:     Mouth: Mucous membranes are moist.  Eyes:     Pupils: Pupils are equal, round, and reactive to light.  Cardiovascular:     Rate and Rhythm: Normal rate and regular rhythm.  Pulmonary:     Effort: Pulmonary effort is normal.  Abdominal:     General: There is no distension.  Musculoskeletal:        General: Normal range of motion.     Cervical back: Normal range of motion.  Skin:    General: Skin is warm.  Neurological:     General: No focal deficit present.     Mental Status: She is alert and oriented to person, place, and time. Mental status is at baseline.     Cranial  Nerves: No cranial nerve deficit.     Sensory: No sensory deficit.     Motor: No weakness.     Coordination: Coordination normal.     Gait: Gait normal.     Deep Tendon Reflexes: Reflexes normal.     ED Results / Procedures / Treatments   Labs (all labs ordered are listed, but only abnormal results are displayed) Labs Reviewed  GLUCOSE, CAPILLARY - Abnormal; Notable for the following components:      Result Value   Glucose-Capillary 130 (*)    All other components within normal limits    EKG None  Radiology No results found.  Procedures Procedures    Medications Ordered in ED Medications  PARoxetine (PAXIL) tablet 20 mg (has no administration in time range)  QUEtiapine (SEROQUEL) tablet 200 mg (200 mg Oral Given 02/09/23 2124)  amLODipine (NORVASC) tablet 10 mg (10 mg Oral Given 02/09/23 1510)  aspirin EC tablet 81 mg (81 mg Oral Given 02/09/23 1510)  ferrous sulfate tablet 325 mg (325 mg Oral Given 02/10/23 0644)  gabapentin (NEURONTIN) capsule 100 mg (100 mg Oral Given 02/09/23 2124)  losartan (COZAAR) tablet 100 mg (100 mg Oral Given 02/09/23 1510)  metFORMIN (GLUCOPHAGE) tablet 500 mg (500 mg Oral Given 02/09/23 1709)  pantoprazole (PROTONIX) EC tablet 40 mg (40 mg Oral Given 02/09/23 1510)  rosuvastatin (CRESTOR) tablet 40 mg (40 mg Oral Given 02/09/23 1510)  acetaminophen (TYLENOL) tablet 650 mg (has no administration in time range)  alum & mag hydroxide-simeth (MAALOX/MYLANTA) 200-200-20 MG/5ML suspension 30 mL (has no administration in time range)  magnesium hydroxide (MILK OF MAGNESIA) suspension 30 mL (has no administration in time range)  haloperidol (HALDOL) tablet 5 mg (has no administration in time range)    Or  haloperidol lactate (HALDOL) injection 5 mg (has no administration in time range)  LORazepam (ATIVAN) tablet 2 mg (has no administration in time range)    Or  LORazepam (ATIVAN) injection 2 mg (has no administration in time range)  diphenhydrAMINE  (BENADRYL) capsule 50 mg (has no administration in time range)    Or  diphenhydrAMINE (BENADRYL) injection 50 mg (has no administration in time range)  hydrOXYzine (ATARAX) tablet 25 mg (25 mg Oral Given 02/09/23 1709)  traZODone (DESYREL) tablet 50 mg (50 mg Oral Given 02/09/23 2304)  feeding supplement (ENSURE ENLIVE / ENSURE PLUS) liquid 237 mL (237 mLs Oral Given 02/09/23 1517)    ED Course/ Medical Decision Making/ A&P  Medical Decision Making Patient reports she was sent to the emergency room to get checked after she fell.  Patient denies any complaints she states that she may have hit her head however she did not lose consciousness she is not on any blood thinners.  Patient states she does not have any injuries.  Patient states that she does not want to have a CT scan.  Amount and/or Complexity of Data Reviewed Independent Historian:     Details: Behavioral health nurses notes reviewed Radiology:     Details: Declines CT scan.  Risk Risk Details: Patient is alert and oriented.  She does not have any evidence of injuries.  I feel patient is stable to return to behavioral health.  I recommended Tylenol if she has any discomfort.          Final Clinical Impression(s) / ED Diagnoses Final diagnoses:  Fall, initial encounter  Contusion of head, unspecified part of head, initial encounter    Rx / DC Orders ED Discharge Orders     None      An After Visit Summary was printed and given to the patient.    Elson Areas, PA-C 02/10/23 1300    Anders Simmonds T, DO 02/14/23 1358

## 2023-02-11 ENCOUNTER — Encounter (HOSPITAL_COMMUNITY): Payer: Self-pay

## 2023-02-11 DIAGNOSIS — F250 Schizoaffective disorder, bipolar type: Secondary | ICD-10-CM | POA: Diagnosis not present

## 2023-02-11 LAB — GLUCOSE, CAPILLARY
Glucose-Capillary: 123 mg/dL — ABNORMAL HIGH (ref 70–99)
Glucose-Capillary: 116 mg/dL — ABNORMAL HIGH (ref 70–99)
Glucose-Capillary: 111 mg/dL — ABNORMAL HIGH (ref 70–99)

## 2023-02-11 MED ORDER — INSULIN ASPART 100 UNIT/ML IJ SOLN: 0.0000 [IU] | Freq: Three times a day (TID) | INTRAMUSCULAR | Status: DC

## 2023-02-11 MED ORDER — ARIPIPRAZOLE 10 MG PO TABS
10.0000 mg | ORAL_TABLET | Freq: Every day | ORAL | Status: DC
  Filled 2023-02-11: qty 1

## 2023-02-11 MED ORDER — ARIPIPRAZOLE 10 MG PO TABS
10.0000 mg | ORAL_TABLET | Freq: Every day | ORAL | Status: DC
  Administered 2023-02-11: 10 mg via ORAL
  Filled 2023-02-11 (×2): qty 1

## 2023-02-11 NOTE — [facility] Group Notes (Signed)
Adult Psychoeducational Group Note  Date:  02/11/2023 Time:  9:22 PM  Group Topic/Focus:  Wrap-Up Group:   The focus of this group is to help patients review their daily goal of treatment and discuss progress on daily workbooks.  Participation Level:  Did Not Attend  Participation Quality:    Affect:    Cognitive:    Insight:   Engagement in Group:    Modes of Intervention:    Additional Comments:    Joselyn Arrow 02/11/2023, 9:22 PM

## 2023-02-11 NOTE — [facility] Suicide Risk Assessment (Signed)
[facility] INPATIENT:  Family/Significant Other Suicide Prevention Education  Suicide Prevention Education:  Patient Refusal for Family/Significant Other Suicide Prevention Education: The patient Kaitlyn Gray has refused to provide written consent for family/significant other to be provided Family/Significant Other Suicide Prevention Education during admission and/or prior to discharge.  Physician notified.  Isabella Bowens 02/11/2023, 3:05 PM

## 2023-02-11 NOTE — Group Note (Signed)
Recreation Therapy Group Note   Group Topic:Other  Group Date: 02/11/2023 Start Time: 1400 End Time: 1440 Facilitators: Iyanna Drummer-McCall, LRT,CTRS Location: [address] Dayroom   Activity Description/Intervention: Therapeutic Drumming. Patients with peers and staff were given the opportunity to engage in a leader facilitated [employer] Group Empowerment Drumming Circle with staff from the [employer], in partnership with [employer]. Teaching laboratory technician and trained [employer], Theodoro Doing leading with LRT observing and documenting intervention and pt response. This evidenced-based practice targets 7 areas of health and wellbeing in the human experience including: stress-reduction, exercise, self-expression, camaraderie/support, nurturing, spirituality, and music-making (leisure).   Goal Area(s) Addresses:  Patient will engage in pro-social way in music group.  Patient will follow directions of drum leader on the first prompt. Patient will demonstrate no behavioral issues during group.  Patient will identify if a reduction in stress level occurs as a result of participation in therapeutic drum circle.    Education: Leisure exposure, Pharmacologist, Musical expression, Discharge Planning   Affect/Mood: N/A   Participation Level: Did not attend    Clinical Observations/Individualized Feedback:     Plan: Continue to engage patient in RT group sessions 2-3x/week.   Jairo Bellew-McCall, LRT,CTRS  02/11/2023 3:49 PM

## 2023-02-11 NOTE — BH IP Treatment Plan (Unsigned)
Interdisciplinary Treatment and Diagnostic Plan Update  02/11/2023 Time of Session: 1125 Kaitlyn Gray MRN: [id number]  Principal Diagnosis: Schizoaffective disorder [facility])  Secondary Diagnoses: Principal Problem:   Schizoaffective disorder (HCC) Active Problems:   Polysubstance abuse (HCC)   Noncompliance with medications   Current Medications:  Current Facility-Administered Medications  Medication Dose Route Frequency Provider Last Rate Last Admin   acetaminophen (TYLENOL) tablet 650 mg  650 mg Oral Q6H PRN Motley-Mangrum, Jadeka A, PMHNP       alum & mag hydroxide-simeth (MAALOX/MYLANTA) 200-200-20 MG/5ML suspension 30 mL  30 mL Oral Q4H PRN Motley-Mangrum, Jadeka A, PMHNP   30 mL at 02/10/23 2213   amLODipine (NORVASC) tablet 10 mg  10 mg Oral Daily Motley-Mangrum, Jadeka A, PMHNP   10 mg at 02/11/23 0812   [START ON 02/12/2023] ARIPiprazole (ABILIFY) tablet 10 mg  10 mg Oral QHS Lauro Franklin, MD       aspirin EC tablet 81 mg  81 mg Oral Daily Motley-Mangrum, Jadeka A, PMHNP   81 mg at 02/11/23 0810   diphenhydrAMINE (BENADRYL) capsule 50 mg  50 mg Oral TID PRN Motley-Mangrum, Jadeka A, PMHNP   50 mg at 02/10/23 2121   Or   diphenhydrAMINE (BENADRYL) injection 50 mg  50 mg Intramuscular TID PRN Motley-Mangrum, Jadeka A, PMHNP       feeding supplement (ENSURE ENLIVE / ENSURE PLUS) liquid 237 mL  237 mL Oral BID BM Nkwenti, Doris, NP   237 mL at 02/11/23 1405   ferrous sulfate tablet 325 mg  325 mg Oral TID WC Motley-Mangrum, Jadeka A, PMHNP   325 mg at 02/11/23 1206   gabapentin (NEURONTIN) capsule 100 mg  100 mg Oral TID Motley-Mangrum, Jadeka A, PMHNP   100 mg at 02/11/23 1207   haloperidol (HALDOL) tablet 5 mg  5 mg Oral TID PRN Motley-Mangrum, Ezra Sites, PMHNP   5 mg at 02/10/23 2121   Or   haloperidol lactate (HALDOL) injection 5 mg  5 mg Intramuscular TID PRN Motley-Mangrum, Geralynn Ochs A, PMHNP       hydrOXYzine (ATARAX) tablet 25 mg  25 mg Oral TID PRN Motley-Mangrum,  Jadeka A, PMHNP   25 mg at 02/10/23 2121   insulin aspart (novoLOG) injection 0-5 Units  0-5 Units Subcutaneous QHS Pashayan, Mardelle Matte, MD       insulin aspart (novoLOG) injection 0-6 Units  0-6 Units Subcutaneous TID WC Pashayan, Mardelle Matte, MD       LORazepam (ATIVAN) tablet 2 mg  2 mg Oral TID PRN Motley-Mangrum, Geralynn Ochs A, PMHNP   2 mg at 02/10/23 2121   Or   LORazepam (ATIVAN) injection 2 mg  2 mg Intramuscular TID PRN Motley-Mangrum, Jadeka A, PMHNP       losartan (COZAAR) tablet 100 mg  100 mg Oral Daily Motley-Mangrum, Jadeka A, PMHNP   100 mg at 02/11/23 0810   magnesium hydroxide (MILK OF MAGNESIA) suspension 30 mL  30 mL Oral Daily PRN Motley-Mangrum, Jadeka A, PMHNP       metFORMIN (GLUCOPHAGE) tablet 500 mg  500 mg Oral BID WC Motley-Mangrum, Jadeka A, PMHNP   500 mg at 02/11/23 0811   pantoprazole (PROTONIX) EC tablet 40 mg  40 mg Oral Daily Motley-Mangrum, Jadeka A, PMHNP   40 mg at 02/11/23 0810   PARoxetine (PAXIL) tablet 20 mg  20 mg Oral Daily Motley-Mangrum, Jadeka A, PMHNP   20 mg at 02/11/23 0810   rosuvastatin (CRESTOR) tablet 40 mg  40 mg Oral Daily  Motley-Mangrum, Ezra Sites, PMHNP   40 mg at 02/11/23 2585   PTA Medications: Medications Prior to Admission  Medication Sig Dispense Refill Last Dose   amLODipine (NORVASC) 10 MG tablet Take 1 tablet (10 mg total) by mouth daily. 30 tablet 2    ARIPiprazole ER (ABILIFY MAINTENA) 400 MG SRER injection Inject 2 mLs (400 mg total) into the muscle every 28 (twenty-eight) days. 1 each 0    aspirin EC 81 MG tablet Take 81 mg by mouth daily. Swallow whole.      ferrous sulfate 325 (65 FE) MG tablet Take 1 tablet (325 mg total) by mouth 3 (three) times daily with meals. 90 tablet 0    gabapentin (NEURONTIN) 100 MG capsule Take 1 capsule (100 mg total) by mouth 3 (three) times daily for pain scale 4-7. 45 capsule 1    losartan (COZAAR) 100 MG tablet Take 1 tablet (100 mg total) by mouth daily. 30 tablet 2    metFORMIN (GLUCOPHAGE)  500 MG tablet Take 1 tablet (500 mg total) by mouth 2 (two) times daily with a meal. 60 tablet 2    nicotine polacrilex (NICORETTE) 2 MG gum Take 1 each (2 mg total) by mouth every 2 (two) hours as needed for Smoking Cravings (Patient taking differently: Take 2 mg by mouth every 2 (two) hours as needed for smoking cessation.) 180 each 1    pantoprazole (PROTONIX) 40 MG tablet Take 1 tablet (40 mg total) by mouth daily. 30 tablet 2    rosuvastatin (CRESTOR) 40 MG tablet Take 1 tablet (40 mg total) by mouth daily. 30 tablet 2     Patient Stressors: Financial difficulties   Health problems    Patient Strengths: Motivation for treatment/growth   Treatment Modalities: Medication Management, Group therapy, Case management,  1 to 1 session with clinician, Psychoeducation, Recreational therapy.   Physician Treatment Plan for Primary Diagnosis: Schizoaffective disorder (HCC) Long Term Goal(s): Improvement in symptoms so as ready for discharge   Short Term Goals: Ability to identify changes in lifestyle to reduce recurrence of condition will improve Ability to verbalize feelings will improve Ability to disclose and discuss suicidal ideas Ability to demonstrate self-control will improve Ability to identify and develop effective coping behaviors will improve Ability to maintain clinical measurements within normal limits will improve Ability to identify triggers associated with substance abuse/mental health issues will improve  Medication Management: Evaluate patient's response, side effects, and tolerance of medication regimen.  Therapeutic Interventions: 1 to 1 sessions, Unit Group sessions and Medication administration.  Evaluation of Outcomes: Progressing  Physician Treatment Plan for Secondary Diagnosis: Principal Problem:   Schizoaffective disorder (HCC) Active Problems:   Polysubstance abuse (HCC)   Noncompliance with medications  Long Term Goal(s): Improvement in symptoms so as ready  for discharge   Short Term Goals: Ability to identify changes in lifestyle to reduce recurrence of condition will improve Ability to verbalize feelings will improve Ability to disclose and discuss suicidal ideas Ability to demonstrate self-control will improve Ability to identify and develop effective coping behaviors will improve Ability to maintain clinical measurements within normal limits will improve Ability to identify triggers associated with substance abuse/mental health issues will improve     Medication Management: Evaluate patient's response, side effects, and tolerance of medication regimen.  Therapeutic Interventions: 1 to 1 sessions, Unit Group sessions and Medication administration.  Evaluation of Outcomes: Progressing   RN Treatment Plan for Primary Diagnosis: Schizoaffective disorder (HCC) Long Term Goal(s): Knowledge of disease and therapeutic  regimen to maintain health will improve  Short Term Goals: Ability to remain free from injury will improve, Ability to verbalize frustration and anger appropriately will improve, Ability to demonstrate self-control, Ability to participate in decision making will improve, Ability to verbalize feelings will improve, Ability to disclose and discuss suicidal ideas, Ability to identify and develop effective coping behaviors will improve, and Compliance with prescribed medications will improve  Medication Management: RN will administer medications as ordered by provider, will assess and evaluate patient's response and provide education to patient for prescribed medication. RN will report any adverse and/or side effects to prescribing provider.  Therapeutic Interventions: 1 on 1 counseling sessions, Psychoeducation, Medication administration, Evaluate responses to treatment, Monitor vital signs and CBGs as ordered, Perform/monitor CIWA, COWS, AIMS and Fall Risk screenings as ordered, Perform wound care treatments as ordered.  Evaluation of  Outcomes: Progressing   LCSW Treatment Plan for Primary Diagnosis: Schizoaffective disorder (HCC) Long Term Goal(s): Safe transition to appropriate next level of care at discharge, Engage patient in therapeutic group addressing interpersonal concerns.  Short Term Goals: Engage patient in aftercare planning with referrals and resources, Increase social support, Increase ability to appropriately verbalize feelings, Increase emotional regulation, Facilitate acceptance of mental health diagnosis and concerns, Facilitate patient progression through stages of change regarding substance use diagnoses and concerns, Identify triggers associated with mental health/substance abuse issues, and Increase skills for wellness and recovery  Therapeutic Interventions: Assess for all discharge needs, 1 to 1 time with Social worker, Explore available resources and support systems, Assess for adequacy in community support network, Educate family and significant other(s) on suicide prevention, Complete Psychosocial Assessment, Interpersonal group therapy.  Evaluation of Outcomes: Progressing   Progress in Treatment: Attending groups: No. Participating in groups: No. Taking medication as prescribed: Yes. Toleration medication: Yes. Family/Significant other contact made: No, will contact:  Pt declined consents Patient understands diagnosis: Yes. Discussing patient identified problems/goals with staff: Yes. Medical problems stabilized or resolved: Yes. Denies suicidal/homicidal ideation: Yes. Issues/concerns per patient self-inventory: Yes. Other: N/A  New problem(s) identified: No, Describe:  None Reported  New Short Term/Long Term Goal(s): medication stabilization, elimination of SI thoughts, development of comprehensive mental wellness plan.   Patient Goals:  Medication Management  Discharge Plan or Barriers: Patient recently admitted. CSW will continue to follow and assess for appropriate referrals and  possible discharge planning.   Reason for Continuation of Hospitalization: Delusions  Depression Hallucinations Medical Issues Medication stabilization Suicidal ideation Withdrawal symptoms  Estimated Length of Stay: 5-7 Days  Last 3 [place] Suicide Severity Risk Score: Flowsheet Row ED to Hosp-Admission (Current) [facility]08/2023 in [facility] ED from 01/21/2023 in [facility] Emergency Department at [facility]  C-SSRS RISK CATEGORY No Risk High Risk Error: Q3, 4, or 5 should not be populated when Q2 is No       Last PHQ 2/9 Scores:    07/17/2022    1:34 PM 12/12/2021    5:37 PM 07/15/2017    7:12 PM  Depression screen PHQ 2/9  Decreased Interest  1 2  Down, Depressed, Hopeless 2 1 2   PHQ - 2 Score 2 2 4   Altered sleeping 3 2 1   Tired, decreased energy 3 2 1   Change in appetite 3 1 1   Feeling bad or failure about yourself  3 2 1   Trouble concentrating 3 1 1   Moving slowly or fidgety/restless 3 1  0  Suicidal thoughts 1 1 1   PHQ-9 Score 21 12 10   Difficult doing work/chores Extremely dIfficult Somewhat difficult Somewhat difficult   detox, medication management for mood stabilization; elimination of SI thoughts; development of comprehensive mental wellness/sobriety plan   Scribe for Treatment Team: Ane Payment, LCSW 02/11/2023 3:07 PM

## 2023-02-11 NOTE — Progress Notes (Signed)
[facility] MD Progress Note  02/11/2023 11:08 AM Kaitlyn Gray  MRN:  [id number] Subjective:   Kaitlyn Gray is a 47 yr old female who presented on 8/25 to [facility] with complaints of Command Hallucinations and SI with a plan (lay on train tracks). PPHx is significant for Schizoaffective Disorder, Bipolar Type, Depression, Anxiety, PTSD, and Polysubstance Abuse (Cocaine, THC), and Multiple Suicide Attempts (last- OD 10/2022), a history of Self Injurious Behavior (Cutting- 12/2022), and Multiples Psychiatric Hospitalizations (last- 11/2022 [facility]).    Case was discussed in the multidisciplinary team. MAR was reviewed and patient was compliant with medications.  She received Agitation PRN's Haldol/Benadryl/Ativan last night.  She received PRN Hydroxyzine last night.  She was found to have a crack pipe in her sock last night.   Psychiatric Team made the following recommendations yesterday: -Stop Seroquel due to fall risk -Start Abilify 5 mg daily for psychosis and mood stability -Continue Paxil 20 mg daily for depression  -Will consider restarting Abilify Maintena   Due to a combination of agitation medications and Abilify this morning she was very tired and so kept the interview short.  On interview today patient reports she slept poor last night.  She reports her appetite is doing fair.  She reports SI due to the voices telling her to hurt herself.  She reports HI.  She reports continuing to have AVH.  She reports no Paranoia or Ideas of Reference.  She reports no issues with her medications.  Discussed with her that since she tolerated the Abilify well we would increase it further.  Attempted to ask about crack pipe found but she did not respond.  She asked when she would be discharged and discussed with her that when she is better we would discharge her.  She reports no other concerns at present.  Principal Problem: Schizoaffective disorder (HCC) Diagnosis: Principal Problem:    Schizoaffective disorder (HCC) Active Problems:   Polysubstance abuse (HCC)   Noncompliance with medications  Total Time spent with patient:  I personally spent 35 minutes on the unit in direct patient care. The direct patient care time included face-to-face time with the patient, reviewing the patient's chart, communicating with other professionals, and coordinating care. Greater than 50% of this time was spent in counseling or coordinating care with the patient regarding goals of hospitalization, psycho-education, and discharge planning needs.   Past Psychiatric History: Schizoaffective Disorder, Bipolar Type, Depression, Anxiety, PTSD, and Polysubstance Abuse (Cocaine, THC), and Multiple Suicide Attempts (last- OD 10/2022), a history of Self Injurious Behavior (Cutting- 12/2022), and Multiples Psychiatric Hospitalizations (last- 11/2022 [facility]).   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) 07/19/2017   Hypertension    Hypertensive emergency 05/31/2022   Ischemic stroke (HCC) 01/01/2020   subacute right middle cerebellar peduncle and pons infarction   Left-sided weakness 01/27/2022   Head CT with remote right occipital infarct which is consistent with old left-sided weakness   MDD (major  depressive disorder), recurrent severe, 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   Normocytic anemia 02/09/2020   Opiate use    History of Suboxone Therapy until 05/2021    Polysubstance abuse (HCC) 07/19/2017   Prescription drug misadventures (Seroquel)     Past Surgical History:  Procedure Laterality Date   CESAREAN SECTION     Family History:  Family History  Problem Relation Age of Onset   Hypertension Mother    CAD Mother 55       died of MI at age 59   Hypertension Father    Family Psychiatric  History:  Mother/Father/Maternal Grandfather and Grandmother- Substance Abuse No Known Diagnosis' or Suicides.  Social History:  Social History   Substance and Sexual Activity  Alcohol Use Not Currently     Social History   Substance and Sexual Activity  Drug Use Yes   Types: Marijuana, Cocaine   Comment: occassionally    Social History   Socioeconomic History   Marital status: Single    Spouse name: Not on file   Number of children: Not on file   Years of education: Not on file   Highest education level: Not on file  Occupational History   Not on file  Tobacco Use   Smoking status: Former    Current packs/day: 0.50    Types: Cigarettes    Passive exposure: Current   Smokeless tobacco: Former  Building services engineer status: Never Used  Substance and Sexual Activity   Alcohol use: Not Currently   Drug use: Yes    Types: Marijuana, Cocaine    Comment: occassionally   Sexual activity: Not on file  Other Topics Concern   Not on file  Social History Narrative   ** Merged History Encounter **       Social Determinants of Health   Financial Resource Strain: Not on file  Food Insecurity: Food Insecurity Present (02/09/2023)   Hunger Vital Sign    Worried About Running Out of Food in the Last Year: Often true    Ran Out of Food in the Last Year: Often true  Transportation Needs: Unmet Transportation Needs (02/09/2023)   PRAPARE - Administrator, Civil Service (Medical): Yes    Lack of Transportation (Non-Medical): Yes  Physical Activity: Not on file  Stress: Not on file  Social Connections: Not on file   Additional  Social History:                         Sleep: Poor  Appetite:  Fair  Current Medications: Current Facility-Administered Medications  Medication Dose Route Frequency Provider Last Rate Last Admin   acetaminophen (TYLENOL) tablet 650 mg  650 mg Oral Q6H PRN Motley-Mangrum, Jadeka A, PMHNP       alum & mag hydroxide-simeth (MAALOX/MYLANTA) 200-200-20 MG/5ML suspension 30 mL  30 mL Oral Q4H PRN Motley-Mangrum, Jadeka A, PMHNP   30 mL at 02/10/23 2213   amLODipine (NORVASC) tablet 10 mg  10 mg Oral Daily Motley-Mangrum, Jadeka A, PMHNP   10 mg at 02/11/23 0812   [START ON 02/12/2023] ARIPiprazole (ABILIFY) tablet 10 mg  10 mg Oral QHS Lauro Franklin, MD       aspirin EC tablet 81 mg  81 mg Oral Daily Motley-Mangrum, Jadeka A, PMHNP   81 mg at 02/11/23 0810   diphenhydrAMINE (BENADRYL) capsule 50 mg  50 mg Oral TID PRN Motley-Mangrum, Jadeka A, PMHNP   50 mg at 02/10/23  2121   Or   diphenhydrAMINE (BENADRYL) injection 50 mg  50 mg Intramuscular TID PRN Motley-Mangrum, Geralynn Ochs A, PMHNP       feeding supplement (ENSURE ENLIVE / ENSURE PLUS) liquid 237 mL  237 mL Oral BID BM Nkwenti, Doris, NP   237 mL at 02/11/23 0848   ferrous sulfate tablet 325 mg  325 mg Oral TID WC Motley-Mangrum, Jadeka A, PMHNP   325 mg at 02/11/23 0810   gabapentin (NEURONTIN) capsule 100 mg  100 mg Oral TID Motley-Mangrum, Jadeka A, PMHNP   100 mg at 02/11/23 2956   haloperidol (HALDOL) tablet 5 mg  5 mg Oral TID PRN Motley-Mangrum, Ezra Sites, PMHNP   5 mg at 02/10/23 2121   Or   haloperidol lactate (HALDOL) injection 5 mg  5 mg Intramuscular TID PRN Motley-Mangrum, Geralynn Ochs A, PMHNP       hydrOXYzine (ATARAX) tablet 25 mg  25 mg Oral TID PRN Motley-Mangrum, Jadeka A, PMHNP   25 mg at 02/10/23 2121   insulin aspart (novoLOG) injection 0-5 Units  0-5 Units Subcutaneous QHS Jenissa Tyrell, Mardelle Matte, MD       insulin aspart (novoLOG) injection 0-6 Units  0-6 Units Subcutaneous TID WC Shun Pletz, Mardelle Matte, MD        LORazepam (ATIVAN) tablet 2 mg  2 mg Oral TID PRN Motley-Mangrum, Ezra Sites, PMHNP   2 mg at 02/10/23 2121   Or   LORazepam (ATIVAN) injection 2 mg  2 mg Intramuscular TID PRN Motley-Mangrum, Geralynn Ochs A, PMHNP       losartan (COZAAR) tablet 100 mg  100 mg Oral Daily Motley-Mangrum, Jadeka A, PMHNP   100 mg at 02/11/23 0810   magnesium hydroxide (MILK OF MAGNESIA) suspension 30 mL  30 mL Oral Daily PRN Motley-Mangrum, Jadeka A, PMHNP       metFORMIN (GLUCOPHAGE) tablet 500 mg  500 mg Oral BID WC Motley-Mangrum, Jadeka A, PMHNP   500 mg at 02/11/23 0811   pantoprazole (PROTONIX) EC tablet 40 mg  40 mg Oral Daily Motley-Mangrum, Jadeka A, PMHNP   40 mg at 02/11/23 0810   PARoxetine (PAXIL) tablet 20 mg  20 mg Oral Daily Motley-Mangrum, Jadeka A, PMHNP   20 mg at 02/11/23 0810   rosuvastatin (CRESTOR) tablet 40 mg  40 mg Oral Daily Motley-Mangrum, Jadeka A, PMHNP   40 mg at 02/11/23 2130    Lab Results:  Results for orders placed or performed during the hospital encounter of 02/09/23 (from the past 48 hour(s))  Glucose, capillary     Status: Abnormal   Collection Time: 02/10/23 10:00 AM  Result Value Ref Range   Glucose-Capillary 130 (H) 70 - 99 mg/dL    Comment: Glucose reference range applies only to samples taken after fasting for at least 8 hours.  Glucose, capillary     Status: None   Collection Time: 02/10/23  5:13 PM  Result Value Ref Range   Glucose-Capillary 94 70 - 99 mg/dL    Comment: Glucose reference range applies only to samples taken after fasting for at least 8 hours.  Glucose, capillary     Status: Abnormal   Collection Time: 02/11/23  7:00 AM  Result Value Ref Range   Glucose-Capillary 123 (H) 70 - 99 mg/dL    Comment: Glucose reference range applies only to samples taken after fasting for at least 8 hours.    Blood Alcohol level:  Lab Results  Component Value Date   ETH <10 02/08/2023   ETH <10 01/21/2023  Metabolic Disorder Labs: Lab Results  Component Value Date    HGBA1C 6.3 (H) 12/23/2022   MPG 134.11 12/23/2022   MPG 136.98 11/24/2022   Lab Results  Component Value Date   PROLACTIN 20.7 12/23/2022   Lab Results  Component Value Date   CHOL 166 12/23/2022   TRIG 99 12/23/2022   HDL 51 12/23/2022   CHOLHDL 3.3 12/23/2022   VLDL 20 12/23/2022   LDLCALC 95 12/23/2022   LDLCALC 77 04/01/2022    Physical Findings: AIMS: Facial and Oral Movements Muscles of Facial Expression: Moderate Lips and Perioral Area: Mild Jaw: Minimal Tongue: Mild,Extremity Movements Upper (arms, wrists, hands, fingers): None, normal Lower (legs, knees, ankles, toes): None, normal, Trunk Movements Neck, shoulders, hips: None, normal, Overall Severity Severity of abnormal movements (highest score from questions above): Minimal Incapacitation due to abnormal movements: None, normal Patient's awareness of abnormal movements (rate only patient's report): Aware, no distress, Dental Status Current problems with teeth and/or dentures?: Yes Does patient usually wear dentures?: No  CIWA:    COWS:     Musculoskeletal: Strength & Muscle Tone: decreased Gait & Station: unsteady uses Rolator Patient leans: N/A  Psychiatric Specialty Exam:  Presentation  General Appearance:  Casual  Eye Contact: Poor  Speech: Garbled  Speech Volume: Normal  Handedness: Right   Mood and Affect  Mood: Depressed  Affect: Constricted; Depressed   Thought Process  Thought Processes: Linear  Descriptions of Associations:Intact  Orientation:Full (Time, Place and Person)  Thought Content:Logical  History of Schizophrenia/Schizoaffective disorder:Yes  Duration of Psychotic Symptoms:Greater than six months  Hallucinations:Hallucinations: Auditory; Visual Description of Auditory Hallucinations: voices saying to harm herself Description of Visual Hallucinations: seeing people in the room  Ideas of Reference:None  Suicidal Thoughts:Suicidal Thoughts: Yes,  Active  Homicidal Thoughts:Homicidal Thoughts: No   Sensorium  Memory: Immediate Fair  Judgment: Intact  Insight: Present   Executive Functions  Concentration: Fair  Attention Span: Fair  Recall: [employer] of Knowledge: Fair  Language: Fair   Psychomotor Activity  Psychomotor Activity: Psychomotor Activity: Normal   Assets  Assets: Communication Skills; Desire for Improvement; Resilience; Housing   Sleep  Sleep: Sleep: Poor    Physical Exam: Physical Exam Vitals and nursing note reviewed.  Constitutional:      General: She is not in acute distress.    Appearance: Normal appearance. She is not ill-appearing or toxic-appearing.  HENT:     Head: Normocephalic and atraumatic.  Pulmonary:     Effort: Pulmonary effort is normal.  Neurological:     Mental Status: She is alert.    Review of Systems  Respiratory:  Negative for cough and shortness of breath.   Cardiovascular:  Negative for chest pain.  Gastrointestinal:  Negative for abdominal pain, constipation, diarrhea, nausea and vomiting.  Neurological:  Positive for dizziness and weakness. Negative for headaches.  Psychiatric/Behavioral:  Positive for hallucinations (AVH) and suicidal ideas (Voices teeling her to hurt self). Negative for depression. The patient is not nervous/anxious.    Blood pressure (!) 152/78, pulse 75, temperature 98.1 F (36.7 C), temperature source Oral, resp. rate 18, height 5\' 3"  (1.6 m), weight 75.6 kg, SpO2 100%. Body mass index is 29.51 kg/m.   Treatment Plan Summary: Daily contact with patient to assess and evaluate symptoms and progress in treatment and Medication management  Kaitlyn Gray is a 47 yr old female who presented on 8/25 to [facility] with complaints of Command Hallucinations and SI with a plan (lay on train tracks).  PPHx is significant for Schizoaffective Disorder, Bipolar Type, Depression, Anxiety, PTSD, and Polysubstance Abuse (Cocaine, THC), and  Multiple Suicide Attempts (last- OD 10/2022), a history of Self Injurious Behavior (Cutting- 12/2022), and Multiples Psychiatric Hospitalizations (last- 11/2022 [facility]).     Kaitlyn Gray tolerated restarting her Abilify so we will further increase with the goal still to be restarting Abilify Maintena.  She was very sedated when attempting to interview today so was unable to find out more about the hidden crack pipe.  We will follow up on this tomorrow.  We will also change her Abilify to the evening starting tomorrow as she already received her dose this morning due to the sedation from it.  We will not make any other changes to her medications at this time.  We will continue to monitor.     Schizoaffective Disorder, Bipolar Type: -Increase Abilify to 10 mg daily for psychosis and mood stability -Continue Paxil 20 mg daily for depression  -Will consider restarting Abilify Maintena -Continue Agitation Protocol: Haldol/Ativan/Benadryl     Polysubstance Abuse: -Encourage abstinence     Hx of Stroke: -Continue Amlodipine 10 mg daily -Continue Aspirin 81 mg daily -Continue Losartan 100 mg daily -Continue Crestor 40 mg daily     Diabetes: -Continue Metformin 500 mg BID -Continue SSI     -Continue Gabapentin 100 mg TID -Continue Protonix 40 mg dialy -Continue Ferrous Sulfate 325 mg TID -Continue Ensure 237 ml BID -Continue PRN's: Tylenol, Maalox, Atarax, Milk of Magnesia  Lauro Franklin, MD 02/11/2023, 11:08 AM

## 2023-02-11 NOTE — Progress Notes (Signed)
   02/11/23 0400  Psych Admission Type (Psych Patients Only)  Admission Status Involuntary  Psychosocial Assessment  Patient Complaints Depression  Eye Contact Fair  Facial Expression Flat  Affect Depressed  Speech Slurred  Interaction Assertive  Motor Activity Slow  Appearance/Hygiene In hospital gown  Behavior Characteristics Unwilling to participate  Mood Depressed  Thought Process  Coherency WDL  Content WDL  Delusions None reported or observed  Perception Hallucinations  Hallucination Auditory;Visual  Judgment Poor  Confusion None  Danger to Self  Current suicidal ideation? Denies  Agreement Not to Harm Self Yes  Description of Agreement verbal  Danger to Others  Danger to Others None reported or observed

## 2023-02-11 NOTE — Group Note (Signed)
Date:  02/11/2023 Time:  10:08 AM  Group Topic/Focus:  Goals Group:   The focus of this group is to help patients establish daily goals to achieve during treatment and discuss how the patient can incorporate goal setting into their daily lives to aide in recovery.    Participation Level:  Did Not Attend   Kaitlyn Gray 02/11/2023, 10:08 AM

## 2023-02-11 NOTE — Progress Notes (Signed)
Patient refused to have CBG taken.

## 2023-02-11 NOTE — Progress Notes (Signed)
   02/11/23 0800  Psych Admission Type (Psych Patients Only)  Admission Status Involuntary  Psychosocial Assessment  Patient Complaints None;Irritability  Eye Contact Fair  Facial Expression Flat  Affect Preoccupied;Irritable  Speech Slurred  Interaction Needy  Motor Activity Slow;Unsteady  Appearance/Hygiene In hospital gown  Behavior Characteristics Unwilling to participate  Mood Irritable;Preoccupied  Thought Process  Coherency WDL  Content WDL  Delusions None reported or observed  Perception Hallucinations  Hallucination Auditory;Visual  Judgment Poor  Confusion None  Danger to Self  Current suicidal ideation? Denies  Agreement Not to Harm Self Yes  Description of Agreement verbal  Danger to Others  Danger to Others None reported or observed

## 2023-02-11 NOTE — Plan of Care (Signed)
  Problem: Education: Goal: Knowledge of [facility] [facility] Education information/materials will improve Outcome: Progressing Goal: Emotional status will improve Outcome: Progressing Goal: Mental status will improve Outcome: Progressing Goal: Verbalization of understanding the information provided will improve Outcome: Progressing   Problem: Activity: Goal: Interest or engagement in activities will improve Outcome: Progressing Goal: Sleeping patterns will improve Outcome: Progressing   Problem: Coping: Goal: Ability to verbalize frustrations and anger appropriately will improve Outcome: Progressing Goal: Ability to demonstrate self-control will improve Outcome: Progressing   Problem: Health Behavior/Discharge Planning: Goal: Identification of resources available to assist in meeting health care needs will improve Outcome: Progressing Goal: Compliance with treatment plan for underlying cause of condition will improve Outcome: Progressing   Problem: Physical Regulation: Goal: Ability to maintain clinical measurements within normal limits will improve Outcome: Progressing   Problem: Safety: Goal: Periods of time without injury will increase Outcome: Progressing   Problem: Education: Goal: Ability to describe self-care measures that may prevent or decrease complications (Diabetes Survival Skills Education) will improve Outcome: Progressing Goal: Individualized Educational Video(s) Outcome: Progressing   Problem: Coping: Goal: Ability to adjust to condition or change in health will improve Outcome: Progressing   Problem: Fluid Volume: Goal: Ability to maintain a balanced intake and output will improve Outcome: Progressing   Problem: Health Behavior/Discharge Planning: Goal: Ability to identify and utilize available resources and services will improve Outcome: Progressing Goal: Ability to manage health-related needs will improve Outcome: Progressing   Problem:  Metabolic: Goal: Ability to maintain appropriate glucose levels will improve Outcome: Progressing   Problem: Nutritional: Goal: Maintenance of adequate nutrition will improve Outcome: Progressing Goal: Progress toward achieving an optimal weight will improve Outcome: Progressing   Problem: Skin Integrity: Goal: Risk for impaired skin integrity will decrease Outcome: Progressing   Problem: Tissue Perfusion: Goal: Adequacy of tissue perfusion will improve Outcome: Progressing

## 2023-02-11 NOTE — Plan of Care (Signed)
  Problem: Education: Goal: Knowledge of Kaitlyn Gray General Education information/materials will improve Outcome: Progressing   Problem: Physical Regulation: Goal: Ability to maintain clinical measurements within normal limits will improve Outcome: Progressing   Problem: Safety: Goal: Periods of time without injury will increase Outcome: Progressing   Problem: Activity: Goal: Interest or engagement in activities will improve Outcome: Not Progressing Goal: Sleeping patterns will improve Outcome: Not Progressing   Problem: Coping: Goal: Ability to verbalize frustrations and anger appropriately will improve Outcome: Not Progressing Goal: Ability to demonstrate self-control will improve Outcome: Not Progressing

## 2023-02-12 DIAGNOSIS — F250 Schizoaffective disorder, bipolar type: Secondary | ICD-10-CM

## 2023-02-12 MED ORDER — NICOTINE POLACRILEX 2 MG MT GUM: 2.0000 mg | CHEWING_GUM | OROMUCOSAL | 0 refills | Status: DC | PRN

## 2023-02-12 MED ORDER — ARIPIPRAZOLE 10 MG PO TABS: 10.0000 mg | ORAL_TABLET | Freq: Every day | ORAL | 0 refills | Status: DC

## 2023-02-12 MED ORDER — PAROXETINE HCL 20 MG PO TABS: 20.0000 mg | ORAL_TABLET | Freq: Every day | ORAL | 0 refills | Status: DC

## 2023-02-12 MED ORDER — HYDROXYZINE HCL 25 MG PO TABS: 25.0000 mg | ORAL_TABLET | Freq: Three times a day (TID) | ORAL | 0 refills | Status: DC | PRN
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
## 2023-02-12 NOTE — Transportation (Signed)
02/12/2023  Kaitlyn Millet DOB: 24-Mar-1976 MRN: [id number]   RIDER WAIVER AND RELEASE OF LIABILITY  For the purposes of helping with transportation needs, [facility] partners with outside transportation providers (taxi companies, [place], Catering manager.) to give [employer] patients or other approved people the choice of on-demand rides [employer]") to our buildings for non-emergency visits.  By using [employer], I, the person signing this document, on behalf of myself and/or any legal minors (in my care using the [employer]), agree:  Science writer given to me are supplied by independent, outside transportation providers who do not work for, or have any affiliation with, [employer]. [place] is not a transportation company. [facility] has no control over the quality or safety of the rides I get using [employer]. [facility] has no control over whether any outside ride will happen on time or not. [facility] gives no guarantee on the reliability, quality, safety, or availability on any rides, or that no mistakes will happen. I know and accept that traveling by vehicle (car, truck, [employer], Zenaida Niece, bus, taxi, etc.) has risks of serious injuries such as disability, being paralyzed, and death. I know and agree the risk of using [employer] is mine alone, and not [employer]. Transport Services are provided "as is" and as are available. The transportation providers are in charge for all inspections and care of the vehicles used to provide these rides. I agree not to take legal action against [facility], its agents, employees, officers, directors, representatives, insurers, attorneys, assigns, successors, subsidiaries, and affiliates at any time for any reasons related directly or indirectly to using [employer]. I also agree not to take legal action against [facility] or its affiliates for any injury, death, or damage to property caused by or related to using  [employer]. I have read this Waiver and Release of Liability, and I understand the terms used in it and their legal meaning. This Waiver is freely and voluntarily given with the understanding that my right (or any legal minors) to legal action against [facility] relating to [employer] is knowingly given up to use these services.   I attest that I read the Ride Waiver and Release of Liability to Kaitlyn Millet, gave Ms. Mcgannon the opportunity to ask questions and answered the questions asked (if any). I affirm that Kaitlyn Millet then provided consent for assistance with transportation.

## 2023-02-12 NOTE — Progress Notes (Signed)
   02/12/23 0000  Psych Admission Type (Psych Patients Only)  Admission Status Involuntary  Psychosocial Assessment  Patient Complaints None;Irritability  Eye Contact Fair  Facial Expression Flat  Affect Preoccupied;Irritable  Speech Slurred  Interaction Needy  Motor Activity Slow;Unsteady  Appearance/Hygiene In hospital gown  Behavior Characteristics Unwilling to participate  Mood Irritable;Preoccupied  Thought Process  Coherency WDL  Content WDL  Delusions None reported or observed  Perception Hallucinations  Hallucination Auditory;Visual  Judgment Poor  Confusion None  Danger to Self  Current suicidal ideation? Denies  Agreement Not to Harm Self Yes  Description of Agreement verbal  Danger to Others  Danger to Others None reported or observed

## 2023-02-12 NOTE — Plan of Care (Signed)
  Problem: Education: Goal: Knowledge of [facility] Education information/materials will improve Outcome: Progressing Goal: Emotional status will improve Outcome: Progressing Goal: Mental status will improve Outcome: Progressing Goal: Verbalization of understanding the information provided will improve Outcome: Progressing   

## 2023-02-12 NOTE — [facility] Group Notes (Signed)

## 2023-02-12 NOTE — Progress Notes (Signed)
Patient discharged from [facility] on 02/12/23. Patient denies SI, plan, and intention. Suicide safety plan completed, reviewed with this RN, given to the patient, and a copy in the chart. Patient denies HI/AVH upon discharge. Patient is alert, oriented, and cooperative. RN provided patient with discharge paperwork and reviewed information with patient. Patient expressed that she understood all of the discharge instructions. Pt was satisfied with belongings returned to her from the locker and at bedside. Discharged patient to [facility] waiting room.
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
## 2023-02-12 NOTE — Progress Notes (Signed)
   02/12/23 0850  Psych Admission Type (Psych Patients Only)  Admission Status Involuntary  Psychosocial Assessment  Patient Complaints Anxiety;Depression;Irritability  Eye Contact Fair  Facial Expression Flat  Affect Irritable  Speech Slurred  Interaction Needy  Motor Activity Unsteady;Slow  Appearance/Hygiene Poor hygiene  Behavior Characteristics Irritable  Mood Depressed;Anxious;Irritable  Thought Process  Coherency WDL  Content WDL  Delusions None reported or observed  Perception WDL  Hallucination None reported or observed  Judgment Poor  Confusion None  Danger to Self  Current suicidal ideation? Denies  Agreement Not to Harm Self Yes  Description of Agreement Verbal  Danger to Others  Danger to Others None reported or observed

## 2023-02-12 NOTE — Progress Notes (Addendum)
  [facility] Adult Case Management Discharge Plan :  Will you be returning to the same living situation after discharge:  Yes,  Patient will be returning back to her home At discharge, do you have transportation home?: Yes,  CSW will be arranging a Taxi for patient through [facility] [employer] at 11:15AM Do you have the ability to pay for your medications: Yes,  [employer] Plan   Release of information consent forms completed and in the chart;  Patient's signature needed at discharge.  Patient to Follow up at:  Follow-up Information     [facility]. Go to.   Specialty: Behavioral Health Why: Please go to this provider for therapy and medication management services. For fastest service, please walk in on Monday through Friday, arrive by 7:00 am for same day service. Contact information: [address] [place] [address] [phone]        [facility] Go on 02/20/2023.   Why: Appointment is scheduled with Wadie Lessen at 12pm Contact information: [address] [place] [address] [phone]                 Next level of care provider has access to [facility] Link:yes  Safety Planning and Suicide Prevention discussed: No.Pt declined      Has patient been referred to the Quitline?: Patient refused referral for treatment  Patient has been referred for addiction treatment: Patient refused referral for treatment.  Isabella Bowens, LCSWA 02/12/2023, 9:46 AM

## 2023-02-18 NOTE — Telephone Encounter (Signed)
I tried to reach the patient again [phone] and the recording stated that the call cannot be completed as dialed

## 2023-02-19 NOTE — Telephone Encounter (Signed)
I tried to reach the patient again [phone] and the recording still states that the call cannot be completed as dialed     I called [employer]: [phone] and spoke to [place] who informed me that the patient's care management agency is [facility]:[phone] #3. I then called [facility] as instructed and had to leave a message requesting a call back.

## 2023-02-23 ENCOUNTER — Emergency Department (HOSPITAL_BASED_OUTPATIENT_CLINIC_OR_DEPARTMENT_OTHER): Payer: MEDICAID

## 2023-02-23 ENCOUNTER — Encounter (HOSPITAL_BASED_OUTPATIENT_CLINIC_OR_DEPARTMENT_OTHER): Payer: Self-pay | Admitting: Emergency Medicine

## 2023-02-23 ENCOUNTER — Emergency Department (HOSPITAL_BASED_OUTPATIENT_CLINIC_OR_DEPARTMENT_OTHER)
Admission: EM | Admit: 2023-02-23 | Discharge: 2023-02-23 | Disposition: A | Discharge status: Home / Self Care | Payer: MEDICAID | Attending: Emergency Medicine | Admitting: Emergency Medicine

## 2023-02-23 ENCOUNTER — Other Ambulatory Visit: Payer: Self-pay

## 2023-02-23 DIAGNOSIS — Z91040 Latex allergy status: Secondary | ICD-10-CM | POA: Insufficient documentation

## 2023-02-23 DIAGNOSIS — F1490 Cocaine use, unspecified, uncomplicated: Secondary | ICD-10-CM | POA: Insufficient documentation

## 2023-02-23 DIAGNOSIS — Z7982 Long term (current) use of aspirin: Secondary | ICD-10-CM | POA: Diagnosis not present

## 2023-02-23 DIAGNOSIS — R531 Weakness: Secondary | ICD-10-CM | POA: Diagnosis present

## 2023-02-23 DIAGNOSIS — Z20822 Contact with and (suspected) exposure to covid-19: Secondary | ICD-10-CM | POA: Diagnosis not present

## 2023-02-23 DIAGNOSIS — Z8673 Personal history of transient ischemic attack (TIA), and cerebral infarction without residual deficits: Secondary | ICD-10-CM | POA: Diagnosis not present

## 2023-02-23 LAB — URINALYSIS, ROUTINE W REFLEX MICROSCOPIC
Bacteria, UA: NONE SEEN
Bilirubin Urine: NEGATIVE
Glucose, UA: NEGATIVE mg/dL
Hgb urine dipstick: NEGATIVE
Ketones, ur: NEGATIVE mg/dL
Leukocytes,Ua: NEGATIVE
Nitrite: NEGATIVE
Protein, ur: NEGATIVE mg/dL
Specific Gravity, Urine: 1.018 (ref 1.005–1.030)
pH: 8 (ref 5.0–8.0)

## 2023-02-23 LAB — CBC
HCT: 27 % — ABNORMAL LOW (ref 36.0–46.0)
Hemoglobin: 8 g/dL — ABNORMAL LOW (ref 12.0–15.0)
MCH: 23.1 pg — ABNORMAL LOW (ref 26.0–34.0)
MCHC: 29.6 g/dL — ABNORMAL LOW (ref 30.0–36.0)
MCV: 77.8 fL — ABNORMAL LOW (ref 80.0–100.0)
Platelets: 261 10*3/uL (ref 150–400)
RBC: 3.47 MIL/uL — ABNORMAL LOW (ref 3.87–5.11)
RDW: 20.7 % — ABNORMAL HIGH (ref 11.5–15.5)
WBC: 4.1 10*3/uL (ref 4.0–10.5)
nRBC: 0 % (ref 0.0–0.2)

## 2023-02-23 LAB — BASIC METABOLIC PANEL
Anion gap: 6 (ref 5–15)
BUN: 17 mg/dL (ref 6–20)
CO2: 27 mmol/L (ref 22–32)
Calcium: 8.9 mg/dL (ref 8.9–10.3)
Chloride: 108 mmol/L (ref 98–111)
Creatinine, Ser: 0.78 mg/dL (ref 0.44–1.00)
GFR, Estimated: >60 mL/min (ref >60)
Glucose, Bld: 134 mg/dL — ABNORMAL HIGH (ref 70–99)
Potassium: 3.8 mmol/L (ref 3.5–5.1)
Sodium: 141 mmol/L (ref 135–145)

## 2023-02-23 LAB — CBG MONITORING, ED: Glucose-Capillary: 103 mg/dL — ABNORMAL HIGH (ref 70–99)

## 2023-02-23 LAB — RAPID URINE DRUG SCREEN, HOSP PERFORMED
Amphetamines: NOT DETECTED
Barbiturates: NOT DETECTED
Benzodiazepines: NOT DETECTED
Cocaine: POSITIVE — AB
Opiates: NOT DETECTED
Tetrahydrocannabinol: NOT DETECTED

## 2023-02-23 LAB — PREGNANCY, URINE: Preg Test, Ur: NEGATIVE

## 2023-02-23 LAB — SARS CORONAVIRUS 2 BY RT PCR: SARS Coronavirus 2 by RT PCR: NEGATIVE

## 2023-02-23 LAB — ETHANOL: Alcohol, Ethyl (B): <10 mg/dL (ref <10)

## 2023-02-23 MED ORDER — IOHEXOL 350 MG/ML SOLN
100.0000 mL | Freq: Once | INTRAVENOUS | Status: AC | PRN
  Administered 2023-02-23: 75 mL via INTRAVENOUS

## 2023-02-23 NOTE — ED Provider Notes (Signed)
[place] EMERGENCY DEPARTMENT AT [facility] Provider Note   CSN: [id number] Arrival date & time: 02/23/23  1047     History {Add pertinent medical, surgical, social history, OB history to HPI:1} Chief Complaint  Patient presents with   Weakness    Kaitlyn Gray is a 47 y.o. female.  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  Patient reports that she woke up this morning feeling "slow" and generally fatigued.  Says that she attempted walking and felt like her left leg was weaker than usual so she decided to present to the emergency department for additional evaluation.  Thinks her speech may have been slurred but is now back to baseline.  Went to bed at 10:30 PM last night and reported that she was at her baseline with only some weakness of her left side.  Later in the history did tell me the right leg was weaker than usual.  Nodding off during my evaluation.  Denies any recent substance use.       Home Medications Prior to Admission medications   Medication Sig Start Date End Date Taking? Authorizing Provider  amLODipine (NORVASC) 10 MG tablet Take 1 tablet (10 mg total) by mouth daily. 06/04/22 12/24/22  [place], Alvira Philips, DO  ARIPiprazole (ABILIFY) 10 MG tablet Take 1 tablet (10 mg total) by mouth at bedtime. 02/12/23   Lauro Franklin, MD  aspirin EC 81 MG tablet Take 81 mg by mouth daily. Swallow whole.    [provider]  ferrous sulfate 325 (65 FE) MG tablet Take 1 tablet (325 mg total) by mouth 3 (three) times daily with meals. 01/21/23 04/21/23  Cathren Laine, MD  gabapentin (NEURONTIN) 100 MG capsule Take 1 capsule (100 mg total) by mouth 3 (three) times daily for pain scale 4-7. 12/01/22     hydrOXYzine (ATARAX) 25 MG tablet Take 1 tablet (25 mg total) by mouth 3 (three) times daily as  needed for anxiety. 02/12/23   Lauro Franklin, MD  losartan (COZAAR) 100 MG tablet Take 1 tablet (100 mg total) by mouth daily. 06/04/22 02/09/23  [place], Alvira Philips, DO  metFORMIN (GLUCOPHAGE) 500 MG tablet Take 1 tablet (500 mg total) by mouth 2 (two) times daily with a meal. 06/04/22 12/24/22  [place], Alvira Philips, DO  nicotine polacrilex (NICORETTE) 2 MG gum Take 1 each (2 mg total) by mouth every 2 (two) hours as needed for Smoking Cravings 02/12/23   Lauro Franklin, MD  pantoprazole (PROTONIX) 40 MG tablet Take 1 tablet (40 mg total) by mouth daily. 06/04/22 12/24/22  [place], Alvira Philips, DO  PARoxetine (PAXIL) 20 MG tablet Take 1 tablet (20 mg total) by mouth daily. 02/13/23   Lauro Franklin, MD  rosuvastatin (CRESTOR) 40 MG tablet Take 1 tablet (40 mg total) by mouth daily. 06/04/22 12/24/22  [place], Eric J, DO      Allergies    Tomato, Pork-derived products, Hydrocodone, and Latex    Review of Systems   Review of Systems  Physical Exam Updated Vital Signs BP (!) 158/75 (BP Location: Right Arm)   Pulse 63   Temp 98.1 F (36.7 C) (Oral)   Ht 5\' 3"  (1.6 m)   Wt 81.6 kg   LMP 01/29/2023 (Approximate)   SpO2 100%   BMI 31.89 kg/m  Physical Exam Vitals and nursing note reviewed.  Constitutional:  General: She is not in acute distress.    Appearance: She is well-developed.  HENT:     Head: Normocephalic and atraumatic.     Right Ear: External ear normal.     Left Ear: External ear normal.     Nose: Nose normal.  Eyes:     Extraocular Movements: Extraocular movements intact.     Conjunctiva/sclera: Conjunctivae normal.     Pupils: Pupils are equal, round, and reactive to light.  Cardiovascular:     Rate and Rhythm: Normal rate and regular rhythm.     Heart sounds: No murmur heard. Pulmonary:     Effort: Pulmonary effort is normal. No respiratory distress.     Breath sounds: Normal breath sounds.  Musculoskeletal:     Cervical back: Normal range of motion and  neck supple.     Right lower leg: No edema.     Left lower leg: No edema.  Skin:    General: Skin is warm and dry.  Neurological:     Mental Status: She is alert and oriented to person, place, and time. Mental status is at baseline.     Comments: NIHSS Exam  Level of Consciousness: Alert  LOC Questions: Answers Month and Age Correctly  LOC Commands: Opens and Closes Eyes and Hands on command  Best Gaze: Horizontal ocular movements intact  Visual Fields: Patient states that she cannot see out of any of her visual field Facial Palsy: None  L Upper Extremity Motor: No effort against gravity R Upper Extremity Motor: No drift after 10 seconds  L Lower extremity Motor: No effort against gravity R Lower extremity Motor: No effort against gravity Ataxia: Unable to participate due to weakness Sensory: Reports diminished sensation to left lower extremity Best Language: No aphasia  Dysarthria: No dysarthria  Neglect: No sensory neglect  However, patient was noted to be able to walk independently with the use of a walker to the restroom immediately after my evaluation  Psychiatric:        Mood and Affect: Mood normal.     ED Results / Procedures / Treatments   Labs (all labs ordered are listed, but only abnormal results are displayed) Labs Reviewed  CBC - Abnormal; Notable for the following components:      Result Value   RBC 3.47 (*)    Hemoglobin 8.0 (*)    HCT 27.0 (*)    MCV 77.8 (*)    MCH 23.1 (*)    MCHC 29.6 (*)    RDW 20.7 (*)    All other components within normal limits  SARS CORONAVIRUS 2 BY RT PCR  BASIC METABOLIC PANEL  URINALYSIS, ROUTINE W REFLEX MICROSCOPIC  PREGNANCY, URINE  CBG MONITORING, ED    EKG None  Radiology No results found.  Procedures Procedures  {Document cardiac monitor, telemetry assessment procedure when appropriate:1}  Medications Ordered in ED Medications - No data to display  ED Course/ Medical Decision Making/ A&P   {   Click  here for ABCD2, HEART and other calculatorsREFRESH Note before signing :1}                              Medical Decision Making Amount and/or Complexity of Data Reviewed Labs: ordered. Radiology: ordered.   ***  {Document critical care time when appropriate:1} {Document review of labs and clinical decision tools ie heart score, Chads2Vasc2 etc:1}  {Document your independent review of radiology images, and any  outside records:1} {Document your discussion with family members, caretakers, and with consultants:1} {Document social determinants of health affecting pt's care:1} {Document your decision making why or why not admission, treatments were needed:1} Final Clinical Impression(s) / ED Diagnoses Final diagnoses:  None    Rx / DC Orders ED Discharge Orders     None

## 2023-02-23 NOTE — ED Triage Notes (Signed)
Pt via ptar from home; c/o generalized weakness and feeling "slow." Pt has 4 prior strokes, has residual left sided weakness. She also c/o pain in both legs today. Pt has some speech deficits that are intermittent. VS 136 cbg 160/90 98% ra HR 65 A&O x 4.

## 2023-02-23 NOTE — ED Notes (Signed)
Pt advised she did not want to go to [facility] and requested that we discharge her. Pt was advised of the risks of leaving AMA up to and including death and pt acknowledged/accepted this. MD notified who then spoke with the pt, MD confirmed pt would be leaving AMA. Signatures obtained

## 2023-02-23 NOTE — ED Notes (Signed)
Pt aware of need for urine sample. Currently unable to provide one.

## 2023-02-23 NOTE — ED Notes (Signed)
Called [facility] to transport patient to [facility] for an MRI.  Dr. Karene Fry accepting.

## 2023-02-25 NOTE — Telephone Encounter (Signed)
I left a message for [employer] requesting a call back  I want to inquire if the patient has a case manager working with her.    I tried to reach the patient again and the recording continues to state that the call cannot be completed as dialed.

## 2023-02-26 NOTE — Telephone Encounter (Signed)
Call received from [employer].  She works with their Economist and said that the patient has not been assigned a Futures trader because they have not been able to reach her.  Brooklyn said they will do some research to try to contact her and will let me know if they are able to reach her. I told her that we are trying to get her connected back with primary care.

## 2023-03-12 ENCOUNTER — Encounter (HOSPITAL_COMMUNITY): Payer: Self-pay

## 2023-03-12 ENCOUNTER — Other Ambulatory Visit: Payer: Self-pay

## 2023-03-12 ENCOUNTER — Emergency Department (HOSPITAL_COMMUNITY)
Admission: EM | Admit: 2023-03-12 | Discharge: 2023-03-12 | Disposition: A | Discharge status: Home / Self Care | Payer: MEDICAID | Attending: Emergency Medicine | Admitting: Emergency Medicine

## 2023-03-12 DIAGNOSIS — R531 Weakness: Secondary | ICD-10-CM | POA: Diagnosis present

## 2023-03-12 DIAGNOSIS — Z5321 Procedure and treatment not carried out due to patient leaving prior to being seen by health care provider: Secondary | ICD-10-CM | POA: Insufficient documentation

## 2023-03-12 NOTE — ED Triage Notes (Signed)
Pt BIB EMS with c/o generalized weakness and slurred speech. LSW 2300 last night. Pt has history of stroke and states this feel similar. A&OX4 and ambulatory bp168/90 p 66 spo2 100 cbg 136. No other neuro deficits

## 2023-03-12 NOTE — ED Notes (Signed)
During triage pt stated that if she did not get a bed she was going home. Informed pt that we will finish triage and assess bed situation. Pt refused and stood up with walker and stated she was leaving. Asked pt if she would like to see doctor first. Pt stated that she was leaving as she walked away. NAD NARD.

## 2023-03-27 ENCOUNTER — Emergency Department (HOSPITAL_COMMUNITY): Payer: MEDICAID

## 2023-03-27 ENCOUNTER — Emergency Department (HOSPITAL_COMMUNITY)
Admission: EM | Admit: 2023-03-27 | Discharge: 2023-03-27 | Disposition: A | Discharge status: Home / Self Care | Payer: MEDICAID | Attending: Emergency Medicine | Admitting: Emergency Medicine

## 2023-03-27 ENCOUNTER — Other Ambulatory Visit: Payer: Self-pay

## 2023-03-27 ENCOUNTER — Encounter (HOSPITAL_COMMUNITY): Payer: Self-pay | Admitting: Emergency Medicine

## 2023-03-27 DIAGNOSIS — I10 Essential (primary) hypertension: Secondary | ICD-10-CM | POA: Insufficient documentation

## 2023-03-27 DIAGNOSIS — F1910 Other psychoactive substance abuse, uncomplicated: Secondary | ICD-10-CM | POA: Diagnosis present

## 2023-03-27 DIAGNOSIS — Z7984 Long term (current) use of oral hypoglycemic drugs: Secondary | ICD-10-CM | POA: Diagnosis not present

## 2023-03-27 DIAGNOSIS — R531 Weakness: Secondary | ICD-10-CM | POA: Diagnosis not present

## 2023-03-27 DIAGNOSIS — Z7982 Long term (current) use of aspirin: Secondary | ICD-10-CM | POA: Insufficient documentation

## 2023-03-27 DIAGNOSIS — Z20822 Contact with and (suspected) exposure to covid-19: Secondary | ICD-10-CM | POA: Diagnosis not present

## 2023-03-27 DIAGNOSIS — Z91040 Latex allergy status: Secondary | ICD-10-CM | POA: Insufficient documentation

## 2023-03-27 DIAGNOSIS — R4182 Altered mental status, unspecified: Secondary | ICD-10-CM | POA: Insufficient documentation

## 2023-03-27 DIAGNOSIS — W19XXXA Unspecified fall, initial encounter: Secondary | ICD-10-CM | POA: Diagnosis not present

## 2023-03-27 DIAGNOSIS — E119 Type 2 diabetes mellitus without complications: Secondary | ICD-10-CM | POA: Diagnosis not present

## 2023-03-27 DIAGNOSIS — Z79899 Other long term (current) drug therapy: Secondary | ICD-10-CM | POA: Diagnosis not present

## 2023-03-27 LAB — CBC WITH DIFFERENTIAL/PLATELET
Abs Immature Granulocytes: 0 10*3/uL (ref 0.00–0.07)
Basophils Absolute: 0.1 10*3/uL (ref 0.0–0.1)
Basophils Relative: 1 %
Eosinophils Absolute: 0.2 10*3/uL (ref 0.0–0.5)
Eosinophils Relative: 4 %
HCT: 28.2 % — ABNORMAL LOW (ref 36.0–46.0)
Hemoglobin: 8.2 g/dL — ABNORMAL LOW (ref 12.0–15.0)
Immature Granulocytes: 0 %
Lymphocytes Relative: 34 %
Lymphs Abs: 1.6 10*3/uL (ref 0.7–4.0)
MCH: 21.8 pg — ABNORMAL LOW (ref 26.0–34.0)
MCHC: 29.1 g/dL — ABNORMAL LOW (ref 30.0–36.0)
MCV: 75 fL — ABNORMAL LOW (ref 80.0–100.0)
Monocytes Absolute: 0.3 10*3/uL (ref 0.1–1.0)
Monocytes Relative: 7 %
Neutro Abs: 2.5 10*3/uL (ref 1.7–7.7)
Neutrophils Relative %: 54 %
Platelets: 357 10*3/uL (ref 150–400)
RBC: 3.76 MIL/uL — ABNORMAL LOW (ref 3.87–5.11)
RDW: 18.9 % — ABNORMAL HIGH (ref 11.5–15.5)
WBC: 4.7 10*3/uL (ref 4.0–10.5)
nRBC: 0 % (ref 0.0–0.2)

## 2023-03-27 LAB — COMPREHENSIVE METABOLIC PANEL
ALT: 22 U/L (ref 0–44)
AST: 17 U/L (ref 15–41)
Albumin: 3.4 g/dL — ABNORMAL LOW (ref 3.5–5.0)
Alkaline Phosphatase: 51 U/L (ref 38–126)
Anion gap: 11 (ref 5–15)
BUN: 12 mg/dL (ref 6–20)
CO2: 22 mmol/L (ref 22–32)
Calcium: 8.9 mg/dL (ref 8.9–10.3)
Chloride: 107 mmol/L (ref 98–111)
Creatinine, Ser: 0.75 mg/dL (ref 0.44–1.00)
GFR, Estimated: >60 mL/min (ref >60)
Glucose, Bld: 65 mg/dL — ABNORMAL LOW (ref 70–99)
Potassium: 3.8 mmol/L (ref 3.5–5.1)
Sodium: 140 mmol/L (ref 135–145)
Total Bilirubin: 0.3 mg/dL (ref 0.3–1.2)
Total Protein: 6.7 g/dL (ref 6.5–8.1)

## 2023-03-27 LAB — URINALYSIS, ROUTINE W REFLEX MICROSCOPIC
Bilirubin Urine: NEGATIVE
Glucose, UA: NEGATIVE mg/dL
Hgb urine dipstick: NEGATIVE
Ketones, ur: 5 mg/dL — AB
Leukocytes,Ua: NEGATIVE
Nitrite: NEGATIVE
Protein, ur: 30 mg/dL — AB
Specific Gravity, Urine: 1.03 (ref 1.005–1.030)
pH: 5 (ref 5.0–8.0)

## 2023-03-27 LAB — HCG, SERUM, QUALITATIVE: Preg, Serum: NEGATIVE

## 2023-03-27 LAB — AMMONIA: Ammonia: 24 umol/L (ref 9–35)

## 2023-03-27 LAB — RAPID URINE DRUG SCREEN, HOSP PERFORMED
Amphetamines: NOT DETECTED
Barbiturates: NOT DETECTED
Benzodiazepines: NOT DETECTED
Cocaine: POSITIVE — AB
Opiates: NOT DETECTED
Tetrahydrocannabinol: POSITIVE — AB

## 2023-03-27 LAB — SARS CORONAVIRUS 2 BY RT PCR: SARS Coronavirus 2 by RT PCR: NEGATIVE

## 2023-03-27 MED ORDER — LORAZEPAM 1 MG PO TABS
1.0000 mg | ORAL_TABLET | Freq: Once | ORAL | Status: AC
  Administered 2023-03-27: 1 mg via ORAL
  Filled 2023-03-27: qty 1

## 2023-03-27 NOTE — ED Notes (Signed)
Attempted to reassess vitals. Unable to complete at this time.

## 2023-03-27 NOTE — ED Notes (Signed)
Pt ambulating in WR pushing WC

## 2023-03-27 NOTE — ED Notes (Signed)
Pt stated she was claustrophobic. Asked Dr for medication via secure chat.

## 2023-03-27 NOTE — ED Provider Notes (Signed)
[place] EMERGENCY DEPARTMENT AT [facility] Provider Note   CSN: [id number] Arrival date & time: 03/27/23  1245     History  Chief Complaint  Patient presents with   Fall   Headache   Aphasia    Kaitlyn Gray is a 47 y.o. female.  Pt is a 47 yo female with pmhx significant for bipolar d/o, htn, dm, polysubstance abuse, cva, depression, and hx cervical fx. Pt said she fell 2 days ago.  She feels like both legs are weak.  She feels like she is having trouble speaking.  She is not having any problems eating/swallowing.  She said she's not been taking any of her meds.  She was noted to be walking around in the waiting room.       Home Medications Prior to Admission medications   Medication Sig Start Date End Date Taking? Authorizing Provider  amLODipine (NORVASC) 10 MG tablet Take 1 tablet (10 mg total) by mouth daily. 06/04/22 12/24/22  [place], Alvira Philips, DO  ARIPiprazole (ABILIFY) 10 MG tablet Take 1 tablet (10 mg total) by mouth at bedtime. 02/12/23   Lauro Franklin, MD  aspirin EC 81 MG tablet Take 81 mg by mouth daily. Swallow whole.    [provider]  ferrous sulfate 325 (65 FE) MG tablet Take 1 tablet (325 mg total) by mouth 3 (three) times daily with meals. 01/21/23 04/21/23  Cathren Laine, MD  gabapentin (NEURONTIN) 100 MG capsule Take 1 capsule (100 mg total) by mouth 3 (three) times daily for pain scale 4-7. 12/01/22     hydrOXYzine (ATARAX) 25 MG tablet Take 1 tablet (25 mg total) by mouth 3 (three) times daily as needed for anxiety. 02/12/23   Lauro Franklin, MD  losartan (COZAAR) 100 MG tablet Take 1 tablet (100 mg total) by mouth daily. 06/04/22 02/09/23  [place], Alvira Philips, DO  metFORMIN (GLUCOPHAGE) 500 MG tablet Take 1 tablet (500 mg total) by mouth 2 (two) times daily with a meal. 06/04/22 12/24/22  [place], Alvira Philips, DO  nicotine polacrilex (NICORETTE) 2 MG gum Take 1 each (2 mg total) by mouth every 2 (two) hours as needed for Smoking  Cravings 02/12/23   Lauro Franklin, MD  pantoprazole (PROTONIX) 40 MG tablet Take 1 tablet (40 mg total) by mouth daily. 06/04/22 12/24/22  [place], Alvira Philips, DO  PARoxetine (PAXIL) 20 MG tablet Take 1 tablet (20 mg total) by mouth daily. 02/13/23   Lauro Franklin, MD  rosuvastatin (CRESTOR) 40 MG tablet Take 1 tablet (40 mg total) by mouth daily. 06/04/22 12/24/22  [place], Eric J, DO      Allergies    Tomato, Pork-derived products, Hydrocodone, and Latex    Review of Systems   Review of Systems  Neurological:  Positive for speech difficulty and weakness.  All other systems reviewed and are negative.   Physical Exam Updated Vital Signs BP (!) 165/94 (BP Location: Left Arm)   Pulse 67   Temp 98 F (36.7 C) (Oral)   Resp 20   Ht 5\' 8"  (1.727 m)   Wt 81 kg   LMP  (LMP Unknown)   SpO2 100%   BMI 27.15 kg/m  Physical Exam Vitals and nursing note reviewed.  Constitutional:      Appearance: She is well-developed. She is obese.  HENT:     Head: Normocephalic and atraumatic.     Mouth/Throat:     Mouth: Mucous membranes are moist.  Pharynx: Oropharynx is clear.  Eyes:     Extraocular Movements: Extraocular movements intact.     Pupils: Pupils are equal, round, and reactive to light.  Cardiovascular:     Rate and Rhythm: Normal rate and regular rhythm.     Heart sounds: Normal heart sounds.  Pulmonary:     Effort: Pulmonary effort is normal.     Breath sounds: Normal breath sounds.  Abdominal:     General: Bowel sounds are normal.     Palpations: Abdomen is soft.  Musculoskeletal:        General: Normal range of motion.     Cervical back: Normal range of motion and neck supple.  Skin:    General: Skin is warm and dry.  Neurological:     Mental Status: She is alert and oriented to person, place, and time.  Psychiatric:        Mood and Affect: Mood normal.        Speech: Speech normal.        Behavior: Behavior normal.     ED Results / Procedures /  Treatments   Labs (all labs ordered are listed, but only abnormal results are displayed) Labs Reviewed  COMPREHENSIVE METABOLIC PANEL - Abnormal; Notable for the following components:      Result Value   Glucose, Bld 65 (*)    Albumin 3.4 (*)    All other components within normal limits  URINALYSIS, ROUTINE W REFLEX MICROSCOPIC - Abnormal; Notable for the following components:   Ketones, ur 5 (*)    Protein, ur 30 (*)    Bacteria, UA RARE (*)    All other components within normal limits  RAPID URINE DRUG SCREEN, HOSP PERFORMED - Abnormal; Notable for the following components:   Cocaine POSITIVE (*)    Tetrahydrocannabinol POSITIVE (*)    All other components within normal limits  CBC WITH DIFFERENTIAL/PLATELET - Abnormal; Notable for the following components:   RBC 3.76 (*)    Hemoglobin 8.2 (*)    HCT 28.2 (*)    MCV 75.0 (*)    MCH 21.8 (*)    MCHC 29.1 (*)    RDW 18.9 (*)    All other components within normal limits  SARS CORONAVIRUS 2 BY RT PCR  HCG, SERUM, QUALITATIVE  AMMONIA  CBC WITH DIFFERENTIAL/PLATELET  CBG MONITORING, ED    EKG EKG Interpretation Date/Time:  Friday March 27 2023 15:39:35 EDT Ventricular Rate:  58 PR Interval:  142 QRS Duration:  86 QT Interval:  526 QTC Calculation: 516 R Axis:   64  Text Interpretation: Sinus bradycardia Prolonged QT Abnormal ECG When compared with ECG of 27-Mar-2023 12:37, PREVIOUS ECG IS PRESENT No significant change since last tracing Confirmed by Jacalyn Lefevre [phone]) on 03/27/2023 10:20:27 PM  Radiology No results found.  Procedures Procedures    Medications Ordered in ED Medications  LORazepam (ATIVAN) tablet 1 mg (1 mg Oral Given 03/27/23 1729)    ED Course/ Medical Decision Making/ A&P                                 Medical Decision Making Amount and/or Complexity of Data Reviewed Radiology: ordered.  Risk Prescription drug management.   This patient presents to the ED for concern of  weakness, this involves an extensive number of treatment options, and is a complaint that carries with it a high risk of complications and morbidity.  The  differential diagnosis includes cva, electrolyte abn, anemia, intoxication   Co morbidities that complicate the patient evaluation  bipolar d/o, htn, dm, polysubstance abuse, cva, depression, and hx cervical fx   Additional history obtained:  Additional history obtained from epic chart review  Lab Tests:  I Ordered, and personally interpreted labs.  The pertinent results include:  covid neg, ammonia 24, cmp nl other than bs 65, cbc with hgb low at 8.2 (chronic), uds + cocaine and mj, ua + ketones   Imaging Studies ordered:  I ordered imaging studies including ct head/c-spine  I independently visualized and interpreted imaging which showed  CT head: Atrophy and chronic small vessel ischemic changes. Chronic bilateral  CVAs. No acute intracranial process identified.   CT cervical: Multilevel degenerative disc disease. No acute osseous  abnormalities.  MRI brain:  No acute intracranial abnormality.  2. Multiple remote infarcts with underlying moderately advanced  chronic microvascular ischemic disease as detailed above.   I agree with the radiologist interpretation   Cardiac Monitoring:  The patient was maintained on a cardiac monitor.  I personally viewed and interpreted the cardiac monitored which showed an underlying rhythm of: nsr   Medicines ordered and prescription drug management:   I have reviewed the patients home medicines and have made adjustments as needed   Test Considered:  mri   Critical Interventions:  mri   Problem List / ED Course:  AMS and falls:  likely due to polysubstance abuse on top of prior strokes.  No new stroke today.  No infections.  Pt is able to speak, eat, and ambulate.  She is stable for d/c.  Return if worse.    Reevaluation:  After the interventions noted above, I  reevaluated the patient and found that they have :improved   Social Determinants of Health:  Lives at home   Dispostion:  After consideration of the diagnostic results and the patients response to treatment, I feel that the patent would benefit from discharge with outpatient f/u.          Final Clinical Impression(s) / ED Diagnoses Final diagnoses:  Polysubstance abuse (HCC)  Fall, initial encounter    Rx / DC Orders ED Discharge Orders     None         Jacalyn Lefevre, MD 03/30/23 [phone]

## 2023-03-27 NOTE — ED Triage Notes (Signed)
Pt reports mechanical fall 2 days ago. Pt with boyfriend who states patient is acting different, her balance is off and slurring her speech, unable to walk straight. Boyfriend states after the fall pt was crying and not herself. Vss.

## 2023-03-27 NOTE — ED Provider Triage Note (Signed)
Emergency Medicine Provider Triage Evaluation Note  Kaitlyn Gray , a 47 y.o. female  was evaluated in triage.  Pt complains of change in speech. Her partner states " her mouth is all twisted up." Patient states that she is having trouble walking. She is supposed to take medications and is not taking anything of her medication.  Patient fell and hit her head 2 nights ago.  Seen and shared visit with Dr. Anitra Lauth he feels warm to the touch on examination once rule out of any infectious symptoms.  Review of Systems  Positive: Facial dyskinesia Negative:   Physical Exam  BP (!) 141/87 (BP Location: Right Arm)   Pulse 68   Temp 98.1 F (36.7 C) (Oral)   Resp 18   SpO2 97%  Gen:   Awake, no distress   Resp:  Normal effort  MSK:   Moves extremities without difficulty  Other:    Medical Decision Making  Medically screening exam initiated at 1:03 PM.  Appropriate orders placed.  [name] was informed that the remainder of the evaluation will be completed by another provider, this initial triage assessment does not replace that evaluation, and the importance of remaining in the ED until their evaluation is complete.     Arthor Captain, PA-C 03/31/23 1006

## 2023-03-27 NOTE — ED Notes (Signed)
Taxi voucher obtained for patient.

## 2023-03-31 ENCOUNTER — Encounter: Payer: Self-pay | Admitting: *Deleted

## 2023-03-31 NOTE — Progress Notes (Signed)
Pt attended 01/10/2023 screening event where her bp was 162/90 and her blood sugar was 108. At the event, the pt identified all SDOH insecurities and was given resources at the event. However, during initial f/u, health equity team member unable to contact pt for f/u and reached out to [facility] where pt was seen in 07/2022 to RN CM Robyne Peers to coordinate efforts with pt's [facility] CM team. In the interim, pt had a BH admission when in-pt medical and [facility] care management contact was attempted but did not appear to connect with out-pt/discharge CM. Robyne Peers noted that she was able to contract [facility] but that [facility] had also been unable to contact pt. Since BH/hospital d/c, pt has been seen in the ED on 02/23/23, 03/12/23, and again on 03/27/23. Additional attempt made today to contact pt by last known phone number, which is no longer in service, and letter sent once more to pt's last known address with contact information shared with pt to contact [facility] for f/u, including potential care management support with RN CM Robyne Peers and potentially through [facility]rsk[facility] Squibb, to [facility] case Production designer, theatre/television/film. In-basket message also sent to RN CM Robyne Peers about this latest communication attempt. Additional pt f/u to be scheduled per health equity protocol.

## 2023-04-15 ENCOUNTER — Emergency Department (HOSPITAL_COMMUNITY)
Admission: EM | Admit: 2023-04-15 | Discharge: 2023-04-15 | Discharge status: Against Medical Advice | Payer: MEDICAID | Attending: Emergency Medicine | Admitting: Emergency Medicine

## 2023-04-15 ENCOUNTER — Other Ambulatory Visit: Payer: Self-pay

## 2023-04-15 ENCOUNTER — Encounter (HOSPITAL_COMMUNITY): Payer: Self-pay | Admitting: Emergency Medicine

## 2023-04-15 ENCOUNTER — Emergency Department (HOSPITAL_COMMUNITY): Payer: MEDICAID

## 2023-04-15 DIAGNOSIS — Z5321 Procedure and treatment not carried out due to patient leaving prior to being seen by health care provider: Secondary | ICD-10-CM | POA: Diagnosis not present

## 2023-04-15 DIAGNOSIS — R079 Chest pain, unspecified: Secondary | ICD-10-CM | POA: Insufficient documentation

## 2023-04-15 DIAGNOSIS — R059 Cough, unspecified: Secondary | ICD-10-CM | POA: Insufficient documentation

## 2023-04-15 NOTE — ED Notes (Addendum)
Attempted to draw labs, pt. Asked to stop lab draw, and requested labs not to be drawn at this time.

## 2023-04-15 NOTE — ED Provider Triage Note (Signed)
Emergency Medicine Provider Triage Evaluation Note  Kaitlyn Gray , a 47 y.o. female  was evaluated in triage.  Pt complains of cough and chest pain.  Review of Systems  Positive:  Negative:   Physical Exam  BP (!) 141/100 (BP Location: Left Arm)   Pulse 62   Temp 97.9 F (36.6 C) (Oral)   Resp 16   Ht 5\' 8"  (1.727 m)   Wt 81 kg   LMP  (LMP Unknown)   SpO2 99%   BMI 27.15 kg/m  Gen:   Awake, no distress   Resp:  Normal effort  MSK:   Moves extremities without difficulty  Other:    Medical Decision Making  Medically screening exam initiated at 1:11 PM.  Appropriate orders placed.  TYRANNY HELMKE was informed that the remainder of the evaluation will be completed by another provider, this initial triage assessment does not replace that evaluation, and the importance of remaining in the ED until their evaluation is complete.  Very productive cough while in triage. Has a [place] in nose but not attached to O2 - breathing 100% on RA. Coughing x2 days. Hx of smokers cough but states that this cough is worse. Endorsing dark/greyish phlegm. Patient stating chest hurts which increases when she is coughing.   Denies fever, nausea, vomiting, diarrhea.    Dorthy Cooler, [place] 04/15/23 1314

## 2023-04-15 NOTE — ED Triage Notes (Signed)
Per [facility] pt coming from home c/o chest wall pain worse when coughing and deep breath. Reports cough with brown sputum. Current smoker. Reports feeling run down for multiple days. 90% on RA and placed on 2L [place] by ems.

## 2023-04-18 ENCOUNTER — Emergency Department (HOSPITAL_COMMUNITY)
Admission: EM | Admit: 2023-04-18 | Discharge: 2023-04-19 | Disposition: A | Discharge status: Home / Self Care | Payer: MEDICAID | Attending: Emergency Medicine | Admitting: Emergency Medicine

## 2023-04-18 ENCOUNTER — Other Ambulatory Visit: Payer: Self-pay

## 2023-04-18 DIAGNOSIS — F329 Major depressive disorder, single episode, unspecified: Secondary | ICD-10-CM | POA: Insufficient documentation

## 2023-04-18 DIAGNOSIS — F1410 Cocaine abuse, uncomplicated: Secondary | ICD-10-CM | POA: Diagnosis present

## 2023-04-18 DIAGNOSIS — Z79899 Other long term (current) drug therapy: Secondary | ICD-10-CM | POA: Insufficient documentation

## 2023-04-18 DIAGNOSIS — Z91040 Latex allergy status: Secondary | ICD-10-CM | POA: Diagnosis not present

## 2023-04-18 DIAGNOSIS — Z765 Malingerer [conscious simulation]: Secondary | ICD-10-CM | POA: Diagnosis not present

## 2023-04-18 DIAGNOSIS — F1420 Cocaine dependence, uncomplicated: Secondary | ICD-10-CM | POA: Diagnosis not present

## 2023-04-18 DIAGNOSIS — R45851 Suicidal ideations: Secondary | ICD-10-CM | POA: Insufficient documentation

## 2023-04-18 DIAGNOSIS — F209 Schizophrenia, unspecified: Secondary | ICD-10-CM | POA: Diagnosis not present

## 2023-04-18 DIAGNOSIS — M79606 Pain in leg, unspecified: Secondary | ICD-10-CM | POA: Insufficient documentation

## 2023-04-18 DIAGNOSIS — Z7982 Long term (current) use of aspirin: Secondary | ICD-10-CM | POA: Diagnosis not present

## 2023-04-18 DIAGNOSIS — F1490 Cocaine use, unspecified, uncomplicated: Secondary | ICD-10-CM | POA: Insufficient documentation

## 2023-04-18 LAB — COMPREHENSIVE METABOLIC PANEL
ALT: 12 U/L (ref 0–44)
AST: 15 U/L (ref 15–41)
Albumin: 3.4 g/dL — ABNORMAL LOW (ref 3.5–5.0)
Alkaline Phosphatase: 59 U/L (ref 38–126)
Anion gap: 11 (ref 5–15)
BUN: 16 mg/dL (ref 6–20)
CO2: 23 mmol/L (ref 22–32)
Calcium: 9 mg/dL (ref 8.9–10.3)
Chloride: 106 mmol/L (ref 98–111)
Creatinine, Ser: 1.1 mg/dL — ABNORMAL HIGH (ref 0.44–1.00)
GFR, Estimated: >60 mL/min (ref >60)
Glucose, Bld: 128 mg/dL — ABNORMAL HIGH (ref 70–99)
Potassium: 3.3 mmol/L — ABNORMAL LOW (ref 3.5–5.1)
Sodium: 140 mmol/L (ref 135–145)
Total Bilirubin: 0.3 mg/dL (ref 0.3–1.2)
Total Protein: 7.4 g/dL (ref 6.5–8.1)

## 2023-04-18 LAB — RAPID URINE DRUG SCREEN, HOSP PERFORMED
Amphetamines: NOT DETECTED
Barbiturates: NOT DETECTED
Benzodiazepines: NOT DETECTED
Cocaine: POSITIVE — AB
Opiates: NOT DETECTED
Tetrahydrocannabinol: NOT DETECTED

## 2023-04-18 LAB — CBC
HCT: 29.9 % — ABNORMAL LOW (ref 36.0–46.0)
Hemoglobin: 8.5 g/dL — ABNORMAL LOW (ref 12.0–15.0)
MCH: 21.1 pg — ABNORMAL LOW (ref 26.0–34.0)
MCHC: 28.4 g/dL — ABNORMAL LOW (ref 30.0–36.0)
MCV: 74.2 fL — ABNORMAL LOW (ref 80.0–100.0)
Platelets: 324 10*3/uL (ref 150–400)
RBC: 4.03 MIL/uL (ref 3.87–5.11)
RDW: 19 % — ABNORMAL HIGH (ref 11.5–15.5)
WBC: 7.3 10*3/uL (ref 4.0–10.5)
nRBC: 0 % (ref 0.0–0.2)

## 2023-04-18 LAB — ETHANOL: Alcohol, Ethyl (B): <10 mg/dL (ref <10)

## 2023-04-18 LAB — SALICYLATE LEVEL: Salicylate Lvl: <7 mg/dL — ABNORMAL LOW (ref 7.0–30.0)

## 2023-04-18 LAB — ACETAMINOPHEN LEVEL: Acetaminophen (Tylenol), Serum: <10 ug/mL — ABNORMAL LOW (ref 10–30)

## 2023-04-18 LAB — HCG, SERUM, QUALITATIVE: Preg, Serum: NEGATIVE

## 2023-04-18 MED ORDER — METFORMIN HCL 500 MG PO TABS
500.0000 mg | ORAL_TABLET | Freq: Two times a day (BID) | ORAL | Status: DC
  Filled 2023-04-18: qty 1

## 2023-04-18 MED ORDER — HYDROXYZINE HCL 25 MG PO TABS
25.0000 mg | ORAL_TABLET | Freq: Three times a day (TID) | ORAL | Status: DC | PRN
  Administered 2023-04-19: 25 mg via ORAL
  Filled 2023-04-18: qty 1

## 2023-04-18 MED ORDER — ARIPIPRAZOLE 10 MG PO TABS
10.0000 mg | ORAL_TABLET | Freq: Every day | ORAL | Status: DC
  Administered 2023-04-19: 10 mg via ORAL
  Filled 2023-04-18: qty 1

## 2023-04-18 MED ORDER — LORAZEPAM 1 MG PO TABS
1.0000 mg | ORAL_TABLET | Freq: Once | ORAL | Status: AC
  Administered 2023-04-19: 1 mg via ORAL
  Filled 2023-04-18: qty 1

## 2023-04-18 NOTE — ED Notes (Signed)
Pt given drink and crackers  
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
## 2023-04-18 NOTE — ED Notes (Signed)
Pts walker and clothing belongs placed in purple locker room.

## 2023-04-18 NOTE — ED Notes (Signed)
Pt yelling out requesting something for sleep. Pt advised that they need to do her assessment then we will get her something.

## 2023-04-18 NOTE — ED Triage Notes (Signed)
Pt dropped off to the ED entrance , pt is here with c/o SI, states she wants to find some train tracks to lay on

## 2023-04-18 NOTE — ED Notes (Signed)
A piece of metal was taken from pt in triage taht she was using to scrap her left forearm

## 2023-04-19 ENCOUNTER — Encounter (HOSPITAL_COMMUNITY): Payer: Self-pay

## 2023-04-19 ENCOUNTER — Emergency Department (HOSPITAL_COMMUNITY)
Admission: EM | Admit: 2023-04-19 | Discharge: 2023-04-19 | Disposition: A | Discharge status: Home / Self Care | Payer: MEDICAID | Attending: Emergency Medicine | Admitting: Emergency Medicine

## 2023-04-19 DIAGNOSIS — F1420 Cocaine dependence, uncomplicated: Secondary | ICD-10-CM

## 2023-04-19 DIAGNOSIS — Z7982 Long term (current) use of aspirin: Secondary | ICD-10-CM | POA: Insufficient documentation

## 2023-04-19 DIAGNOSIS — F329 Major depressive disorder, single episode, unspecified: Secondary | ICD-10-CM | POA: Insufficient documentation

## 2023-04-19 DIAGNOSIS — F209 Schizophrenia, unspecified: Secondary | ICD-10-CM | POA: Insufficient documentation

## 2023-04-19 DIAGNOSIS — Z765 Malingerer [conscious simulation]: Secondary | ICD-10-CM | POA: Diagnosis not present

## 2023-04-19 DIAGNOSIS — M79606 Pain in leg, unspecified: Secondary | ICD-10-CM | POA: Insufficient documentation

## 2023-04-19 LAB — CBG MONITORING, ED: Glucose-Capillary: 101 mg/dL — ABNORMAL HIGH (ref 70–99)

## 2023-04-19 MED ORDER — QUETIAPINE FUMARATE 100 MG PO TABS
100.0000 mg | ORAL_TABLET | Freq: Every day | ORAL | Status: DC
  Administered 2023-04-19: 100 mg via ORAL
  Filled 2023-04-19: qty 1

## 2023-04-19 MED ORDER — FLUOXETINE HCL 10 MG PO CAPS
10.0000 mg | ORAL_CAPSULE | Freq: Every day | ORAL | Status: DC
  Administered 2023-04-19: 10 mg via ORAL
  Filled 2023-04-19: qty 1

## 2023-04-19 MED ORDER — QUETIAPINE FUMARATE 100 MG PO TABS
100.0000 mg | ORAL_TABLET | Freq: Every day | ORAL | 0 refills | Status: DC
  Filled 2023-04-19: qty 30, 30d supply, fill #0

## 2023-04-19 MED ORDER — QUETIAPINE FUMARATE 100 MG PO TABS: 100.0000 mg | ORAL_TABLET | Freq: Every day | ORAL | 1 refills | Status: DC

## 2023-04-19 MED ORDER — FLUOXETINE HCL 10 MG PO CAPS
10.0000 mg | ORAL_CAPSULE | Freq: Every day | ORAL | 0 refills | Status: DC
  Filled 2023-04-19: qty 30, 30d supply, fill #0

## 2023-04-19 MED ORDER — QUETIAPINE FUMARATE 100 MG PO TABS: 100.0000 mg | ORAL_TABLET | Freq: Every day | ORAL | Status: DC

## 2023-04-19 NOTE — ED Notes (Signed)
DC  TOC Meds then DC

## 2023-04-19 NOTE — ED Provider Notes (Signed)
  Physical Exam  BP (!) 173/101 (BP Location: Left Arm)   Pulse 63   Temp 97.7 F (36.5 C) (Oral)   Resp 17   LMP  (LMP Unknown)   SpO2 100%   Physical Exam  Procedures  Procedures  ED Course / MDM    Medical Decision Making Amount and/or Complexity of Data Reviewed Labs: ordered.  Risk Prescription drug management.   Patient was evaluated by psychiatry.  They have cleared the patient, and believe she is safe for discharge.  Patient is no longer endorsing SI.  Will discharge home.       Anders Simmonds T, DO 04/19/23 [phone]
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
## 2023-04-19 NOTE — Discharge Instructions (Signed)
Follow-up with your behavioral health counselor

## 2023-04-19 NOTE — Progress Notes (Cosign Needed Addendum)
[facility] ED Progress Note/Discharge Note  04/19/2023 3:39 PM Kaitlyn Gray  MRN:  [id number]   Subjective:  Per Andria Meuse, NP (this writer)  Kaitlyn Gray is a 47 y.o. AA female with a past psychiatric history of bipolar 1 disorder, MDD, adjustment disorder with disturbance of conduct, cannabis use disorder, cocaine use disorder, polysubstance abuse, and altered mental status, with pertinent medical comorbidities/history that include ischemic stroke (2021), hypertension, chronic anemia, cervical fracture, dysarthria, and left-sided weakness, and additional past and pertinent psychiatric history of multiple emergency department, inpatient hospitalizations, and urgent care encounters for, and/or the combination of, decompensation of the patient's mental health in the form of psychosis, worsening depression or anxiety, suicidal or homicidal ideations, malingering, housing instability, and/or secondary gain, who presented this encounter by way of a friend with endorsements of a recrudescence of active suicidal ideations with plan and intent and decompensation of her mental health in the form of worsening depression due to psychosocial stressors.  Patient is currently medically clear and voluntary at this time.  Patient seen today at the [facility] emergency department for face-to-face psychiatric reevaluation.   Upon re-evaluation, patient endorses that she came to the hospital because she ran out of money because she spent it all on cocaine and needed temporary support of an inpatient unit (I.e., food, etc.), UDS notably positive for cocaine this encounter.  Patient endorses that she does experience depression around spending all her money on drugs, but because she has been accepted to [facility], states that she would rather follow-up with outpatient mental health and substance abuse resources and discharge to her apartment with a taxi voucher, states that she is no longer actively suicidal and/or  experiencing suicidal ideations in general. Patient endorses upon discharge she would like to receive the medications recommended by Ms. White, NP, states that these medications have been helpful, states that her mood is dysphoric currently because she got accepted to [facility] and wanted to go to [facility] specifically, but endorses adamantly that she is safe to return back to her apartment versus going to, "that awful place". Patient endorses that she is not experiencing auditory and or visual hallucinations, and objectively, does not present with appear to be responding to internal stimuli and/or presenting with psychotic features.  Patient orientation is intact upon assessment, no concerns for fluctuations of consciousness.  Discussed with patient that the recommendation would be for safety planning to be conducted for safe discharge, the recommendation to follow-up with outpatient substance abuse resources and psychiatric provider for medication management at the [facility], as well as the recommendation to be compliant/continue with medications recommended this encounter. Patient endorsed that she was amenable to recommendations, states that she will try to address her substance abuse upon discharge, states that she knows that her drug use is a problem and really needs to be addressed.  Per nursing and chart review:   Patient has had no behavioral incidents, sleeping and eating fair, compliant with medications, and has not endorsed any suicidal homicidal ideations, presented with psychotic features, and/or presented with safety concerns.  Principal Problem: Cocaine use disorder, severe, dependence (HCC) Diagnosis:  Principal Problem:   Cocaine use disorder, severe, dependence (HCC) Active Problems:   Malingering   ED Assessment Time Calculation: Start Time: 1500 Stop Time: 1530 Total Time in Minutes (Assessment Completion): 30   Past Psychiatric History: Adjustment disorder with disturbance of  conduct, bipolar 1 disorder, cannabis use disorder moderate dependence, cocaine use disorder severe dependence, polysubstance abuse, MDD,  malingering, housing instability, secondary gain; multiple emergency department, urgent care, and inpatient hospitalizations for decompensation of the patient's mental health over the years   [place] Scale:  Flowsheet Row ED from 04/18/2023 in [facility] Emergency Department at [facility] ED from 04/15/2023 in [facility] Emergency Department at [facility] ED from 03/27/2023 in [facility] Emergency Department at [facility]  C-SSRS RISK CATEGORY High Risk No Risk No Risk       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) 07/19/2017   Hypertension    Hypertensive emergency 05/31/2022   Ischemic stroke (HCC) 01/01/2020   subacute right middle cerebellar peduncle and pons infarction   Left-sided weakness 01/27/2022   Head CT with remote right occipital infarct which is consistent with old left-sided weakness   MDD (major depressive disorder), recurrent severe, 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   Normocytic anemia 02/09/2020   Opiate use    History of Suboxone Therapy until 05/2021   Polysubstance abuse (HCC) 07/19/2017   Prescription drug misadventures (Seroquel)     Past Surgical History:  Procedure Laterality Date   CESAREAN SECTION     Family History:  Family History  Problem Relation  Age of Onset   Hypertension Mother    CAD Mother 78       died of MI at age 2   Hypertension Father    Family Psychiatric  History: None reported Social History:  Social History   Substance and Sexual Activity  Alcohol Use Not Currently     Social History   Substance and Sexual Activity  Drug Use Yes   Types: Marijuana, Cocaine   Comment: occassionally    Social History   Socioeconomic History   Marital status: Single    Spouse name: Not on file   Number of children: Not on file   Years of education: Not on file   Highest education level: Not on file  Occupational History   Not on file  Tobacco Use   Smoking status: Former    Types: Cigarettes    Start date: 06/16/1998    Passive exposure: Current   Smokeless tobacco: Former  Building services engineer status: Every Day  Substance and Sexual Activity   Alcohol use: Not Currently   Drug use: Yes    Types: Marijuana, Cocaine    Comment: occassionally   Sexual activity: Not on file  Other Topics Concern   Not on file  Social History Narrative   ** Merged History Encounter **       Social Determinants of Health   Financial Resource Strain: Not on file  Food Insecurity: Food Insecurity Present (02/09/2023)   Hunger Vital Sign    Worried About Running Out of Food in the Last Year: Often true    Ran Out of Food in the Last Year: Often true  Transportation Needs: Unmet Transportation Needs (02/09/2023)   PRAPARE - Transportation    Lack  of Transportation (Medical): Yes    Lack of Transportation (Non-Medical): Yes  Physical Activity: Not on file  Stress: Not on file  Social Connections: Not on file    Sleep: Fair  Appetite:  Good  Current Medications: Current Facility-Administered Medications  Medication Dose Route Frequency Provider Last Rate Last Admin   ARIPiprazole (ABILIFY) tablet 10 mg  10 mg Oral QHS Charlynne Pander, MD   10 mg at 04/19/23 0055   hydrOXYzine (ATARAX) tablet 25 mg  25 mg Oral TID PRN  Charlynne Pander, MD   25 mg at 04/19/23 1516   metFORMIN (GLUCOPHAGE) tablet 500 mg  500 mg Oral BID WC Charlynne Pander, MD       Current Outpatient Medications  Medication Sig Dispense Refill   amLODipine (NORVASC) 10 MG tablet Take 1 tablet (10 mg total) by mouth daily. 30 tablet 2   ARIPiprazole (ABILIFY) 10 MG tablet Take 1 tablet (10 mg total) by mouth at bedtime. 30 tablet 0   aspirin EC 81 MG tablet Take 81 mg by mouth daily. Swallow whole.     ferrous sulfate 325 (65 FE) MG tablet Take 1 tablet (325 mg total) by mouth 3 (three) times daily with meals. 90 tablet 0   gabapentin (NEURONTIN) 100 MG capsule Take 1 capsule (100 mg total) by mouth 3 (three) times daily for pain scale 4-7. 45 capsule 1   hydrOXYzine (ATARAX) 25 MG tablet Take 1 tablet (25 mg total) by mouth 3 (three) times daily as needed for anxiety. 30 tablet 0   losartan (COZAAR) 100 MG tablet Take 1 tablet (100 mg total) by mouth daily. 30 tablet 2   metFORMIN (GLUCOPHAGE) 500 MG tablet Take 1 tablet (500 mg total) by mouth 2 (two) times daily with a meal. 60 tablet 2   nicotine polacrilex (NICORETTE) 2 MG gum Take 1 each (2 mg total) by mouth every 2 (two) hours as needed for Smoking Cravings 100 each 0   pantoprazole (PROTONIX) 40 MG tablet Take 1 tablet (40 mg total) by mouth daily. 30 tablet 2   PARoxetine (PAXIL) 20 MG tablet Take 1 tablet (20 mg total) by mouth daily. 30 tablet 0   rosuvastatin (CRESTOR) 40 MG tablet Take 1 tablet (40 mg total) by mouth daily. 30 tablet 2    Lab Results:  Results for orders placed or performed during the hospital encounter of 04/18/23 (from the past 48 hour(s))  Comprehensive metabolic panel     Status: Abnormal   Collection Time: 04/18/23  5:00 PM  Result Value Ref Range   Sodium 140 135 - 145 mmol/L   Potassium 3.3 (L) 3.5 - 5.1 mmol/L   Chloride 106 98 - 111 mmol/L   CO2 23 22 - 32 mmol/L   Glucose, Bld 128 (H) 70 - 99 mg/dL    Comment: Glucose reference range applies  only to samples taken after fasting for at least 8 hours.   BUN 16 6 - 20 mg/dL   Creatinine, Ser 1.61 (H) 0.44 - 1.00 mg/dL   Calcium 9.0 8.9 - 09.6 mg/dL   Total Protein 7.4 6.5 - 8.1 g/dL   Albumin 3.4 (L) 3.5 - 5.0 g/dL   AST 15 15 - 41 U/L   ALT 12 0 - 44 U/L   Alkaline Phosphatase 59 38 - 126 U/L   Total Bilirubin 0.3 0.3 - 1.2 mg/dL   GFR, Estimated >04 >54 mL/min    Comment: (NOTE) Calculated using the CKD-EPI Creatinine  Equation (2021)    Anion gap 11 5 - 15    Comment: Performed at [facility], [address] [place], [place] [address]  Ethanol     Status: None   Collection Time: 04/18/23  5:00 PM  Result Value Ref Range   Alcohol, Ethyl (B) <10 <10 mg/dL    Comment: (NOTE) Lowest detectable limit for serum alcohol is 10 mg/dL.  For medical purposes only. Performed at [facility], [address] [place], [place] [address]   Salicylate level     Status: Abnormal   Collection Time: 04/18/23  5:00 PM  Result Value Ref Range   Salicylate Lvl <7.0 (L) 7.0 - 30.0 mg/dL    Comment: Performed at [facility], [address] [place], [place] [address]  Acetaminophen level     Status: Abnormal   Collection Time: 04/18/23  5:00 PM  Result Value Ref Range   Acetaminophen (Tylenol), Serum <10 (L) 10 - 30 ug/mL    Comment: (NOTE) Therapeutic concentrations vary significantly. A range of 10-30 ug/mL  may be an effective concentration for many patients. However, some  are best treated at concentrations outside of this range. Acetaminophen concentrations >150 ug/mL at 4 hours after ingestion  and >50 ug/mL at 12 hours after ingestion are often associated with  toxic reactions.  Performed at [facility], [address], [place], [place] [address]   cbc     Status: Abnormal   Collection Time: 04/18/23  5:00 PM  Result Value Ref Range   WBC 7.3 4.0 - 10.5 K/uL   RBC 4.03 3.87 - 5.11 MIL/uL   Hemoglobin 8.5 (L) 12.0 - 15.0 g/dL    Comment:  Reticulocyte Hemoglobin testing may be clinically indicated, consider ordering this additional test [id number]    HCT 29.9 (L) 36.0 - 46.0 %   MCV 74.2 (L) 80.0 - 100.0 fL   MCH 21.1 (L) 26.0 - 34.0 pg   MCHC 28.4 (L) 30.0 - 36.0 g/dL   RDW 76.2 (H) 83.1 - 51.7 %   Platelets 324 150 - 400 K/uL   nRBC 0.0 0.0 - 0.2 %    Comment: Performed at [facility], [address] [place], [place] [address]  hCG, serum, qualitative     Status: None   Collection Time: 04/18/23  5:00 PM  Result Value Ref Range   Preg, Serum NEGATIVE NEGATIVE    Comment:        THE SENSITIVITY OF THIS METHODOLOGY IS >10 mIU/mL. Performed at [facility], [address] [place], [place] [address]   Rapid urine drug screen (hospital performed)     Status: Abnormal   Collection Time: 04/18/23  9:37 PM  Result Value Ref Range   Opiates NONE DETECTED NONE DETECTED   Cocaine POSITIVE (A) NONE DETECTED   Benzodiazepines NONE DETECTED NONE DETECTED   Amphetamines NONE DETECTED NONE DETECTED   Tetrahydrocannabinol NONE DETECTED NONE DETECTED   Barbiturates NONE DETECTED NONE DETECTED    Comment: (NOTE) DRUG SCREEN FOR MEDICAL PURPOSES ONLY.  IF CONFIRMATION IS NEEDED FOR ANY PURPOSE, NOTIFY LAB WITHIN 5 DAYS.  LOWEST DETECTABLE LIMITS FOR URINE DRUG SCREEN Drug Class                     Cutoff (ng/mL) Amphetamine and metabolites    1000 Barbiturate and metabolites    200 Benzodiazepine  200 Opiates and metabolites        300 Cocaine and metabolites        300 THC                            50 Performed at [facility], [address], [place], [place] [address]   CBG monitoring, ED     Status: Abnormal   Collection Time: 04/19/23  9:02 AM  Result Value Ref Range   Glucose-Capillary 101 (H) 70 - 99 mg/dL    Comment: Glucose reference range applies only to samples taken after fasting for at least 8 hours.    Blood Alcohol level:  Lab Results  Component Value Date    ETH <10 04/18/2023   ETH <10 02/23/2023    Physical Findings:  CIWA:    COWS:     Musculoskeletal: Strength & Muscle Tone: decreased Gait & Station: unsteady, Uses rollator Patient leans: N/A  Psychiatric Specialty Exam:  Presentation  General Appearance:  Disheveled  Eye Contact: Good  Speech: Other (comment) (Mostly clear and coherent (some deficit slurring d/t hx stroke))  Speech Volume: Increased  Handedness: Right   Mood and Affect  Mood: Dysphoric  Affect: Congruent   Thought Process  Thought Processes: Linear; Goal Directed; Coherent  Descriptions of Associations:Intact  Orientation:Full (Time, Place and Person)  Thought Content:Logical  History of Schizophrenia/Schizoaffective disorder:Yes  Duration of Psychotic Symptoms:Greater than six months  Hallucinations:Hallucinations: None Description of Auditory Hallucinations: Reported [facility] telling her to kill herself  Ideas of Reference:None  Suicidal Thoughts:Suicidal Thoughts: No SI Active Intent and/or Plan: With Plan  Homicidal Thoughts:Homicidal Thoughts: No   Sensorium  Memory: Immediate Good; Recent Good; Remote Good  Judgment: Intact  Insight: Present   Executive Functions  Concentration: Good  Attention Span: Good  Recall: Good  Fund of Knowledge: Good  Language: Good   Psychomotor Activity  Psychomotor Activity: Psychomotor Activity: Normal   Assets  Assets: Financial Resources/Insurance; Housing; Resilience; Social Support; Leisure Time; Manufacturing systems engineer; Desire for Improvement   Sleep  Sleep: Sleep: Fair    Physical Exam: Physical Exam Vitals and nursing note reviewed.  Constitutional:      General: She is not in acute distress.    Appearance: She is not ill-appearing, toxic-appearing or diaphoretic.  Pulmonary:     Effort: Pulmonary effort is normal.  Skin:    General: Skin is warm and dry.  Neurological:     Mental Status: She is  alert and oriented to person, place, and time.  Psychiatric:        Attention and Perception: Attention and perception normal. She does not perceive auditory or visual hallucinations.        Mood and Affect: Affect is angry.        Speech: Speech is slurred (Mildly and intermittently slurred d/t hx stroke).        Behavior: Behavior is agitated. Behavior is cooperative.        Thought Content: Thought content is not paranoid or delusional. Thought content does not include homicidal or suicidal ideation.        Cognition and Memory: Cognition and memory normal.        Judgment: Judgment normal.    Review of Systems  Neurological:  Positive for weakness (D/t hx stroke).  Psychiatric/Behavioral:  Positive for substance abuse (Cocaine). Negative for depression, hallucinations and suicidal ideas. The patient is not nervous/anxious and does not have insomnia.  All other systems reviewed and are negative.  Blood pressure (!) 173/101, pulse 63, temperature 97.7 F (36.5 C), temperature source Oral, resp. rate 17, SpO2 100%. There is no height or weight on file to calculate BMI.   Medical Decision Making:  Patient presented this encounter by way of a friend with endorsements of a recrudescence of active suicidal ideations with plan and intent and decompensation of her mental health in the form of worsening depression due to psychosocial stressors.   Upon evaluation, patient endorses that she came to the hospital because she ran out of money because she spent it all on cocaine, UDS notably positive for cocaine this encounter.  Patient endorses that she does experience depression around spending all her money on drugs, but because she has been accepted to [facility], states that she would rather follow-up with outpatient mental health and substance abuse resources at discharge to her apartment with a Voucher, states that she is no longer actively suicidal and/or experiencing suicidal ideations in  general, and safety planning able to be put into place with patient cooperation.  From the evaluation performed today, patient presents with symptomology that is most consistent with malingering/secondary gain and severe cocaine use disorder.  Given the findings today of endorsements of malingering for secondary gain, and endorsements of no suicidal homicidal ideations and/or decompensation into psychosis, and no safety concerns appreciated during evaluation, recommendation at this time is for psychiatric clearance, as well as the additional recommendations listed below.  Spoke with Dr. Jannifer Franklin about this writer's evaluation, as well as the recommendations put forward today, states that he agrees with the plan of care and the recommendations listed below.  Recommendations- psychiatrically cleared  #Cocaine use disorder severe #Malingering  -Recommend discharge back to home by way of taxi -Recommend continue current medications for mental health -Recommend close outpatient follow-up with mental health medication management and therapy services at the [facility] -Recommend close outpatient follow-up with substance abuse resources given upon discharge -Recommend adherence to safety plan listed below -Recommend in-house prescriptions for the patient's medications to be sent with her upon discharge  Safety Plan Zada DEAYSIA GRIGORYAN will reach out to Lester Kinsman (aunt), call 911 or call mobile crisis, or go to nearest emergency room if condition worsens or if suicidal thoughts become appreciable Patients' will follow up with [facility]/substance abuse resources  The suicide prevention education provided includes the following: Suicide risk factors Suicide prevention and interventions National Suicide Hotline telephone number [facility] assessment telephone number [facility] Emergency Assistance 911 [facility] and/or Residential Mobile Crisis Unit telephone number  Lenox Ponds,  NP 04/19/2023, 3:39 PM

## 2023-04-19 NOTE — ED Notes (Signed)
Discharge instructions reviewed w/ pt along w/ medications and where to pick them up. Pt in NAD at time of departure and ambulatory w/ steady gait w/ rollator. Refused VS.

## 2023-04-19 NOTE — Consult Note (Signed)
[facility] Consult Note  Patient Name: Kaitlyn Gray MRN: [id number] DOB: 1976-05-24 DATE OF Consult: 04/19/2023  PRIMARY PSYCHIATRIC DIAGNOSES  1.  Bipolar Disorder by hx 2.  Cocaine Use Disorder 3.    RECOMMENDATIONS  Recommendations: Medication recommendations: Seroquel 100 mg po at bedtime for mood stabilization, discontinue Abilify, add Prozac 10 mg po daily for depression/anxiety Non-Medication/therapeutic recommendations: Inpatient psychiatric admission Is inpatient psychiatric hospitalization recommended for this patient? Yes (Explain why): SI with plan Is another care setting recommended for this patient? (examples may include Crisis Stabilization Unit, Residential/Recovery Treatment, ALF/SNF, Memory Care Unit)  No (Explain why):   From a psychiatric perspective, is this patient appropriate for discharge to an outpatient setting/resource or other less restrictive environment for continued care?  No (Explain why): SI with plan Follow-Up Telepsychiatry C/L services: We will sign off for now. Please re-consult our service if needed for any concerning changes in the patient's condition, discharge planning, or questions. Communication: Treatment team members (and family members if applicable) who were involved in treatment/care discussions and planning, and with whom we spoke or engaged with via secure text/chat, include the following: Jasmine; Morrie Sheldon; Norberta Keens  Thank you for involving [place] in the care of this patient. If you have any additional questions or concerns, please call [phone] and ask for me or the provider on-call.  TELEPSYCHIATRY ATTESTATION & CONSENT  As the provider for this telehealth consult, I attest that I verified the patient's identity using two separate identifiers, introduced myself to the patient, provided my credentials, disclosed my location, and performed this encounter via a HIPAA-compliant, real-time, face-to-face, two-way, interactive audio and  video platform and with the full consent and agreement of the patient (or guardian as applicable.)  Patient physical location: [facility]. Telehealth provider physical location: home office in state of [place].  Video start time: 2321  (Central Time) Video end time: 2343  (Central Time)  IDENTIFYING DATA  Kaitlyn Gray is a 47 y.o. year-old female for whom a psychiatric consultation has been ordered by the primary provider. The patient was identified using two separate identifiers.  CHIEF COMPLAINT/REASON FOR CONSULT  S/I  HISTORY OF PRESENT ILLNESS (HPI)  The patient presented to the ED with c/o SI with plan. Per chart, pt has a hx of Bipolar Disorder with medication non-compliance.  Pt reportedly had a piece of metal that she was using to scrape her forearm with that was removed from her in triage.    Upon evaluation, pt reported she wanted to lay down on the train tracks and let the train run her over.  Reported she decided to come in for help instead.  Reported "I'm very depressed".  Reported financial difficulties due to allegations that her current representative payee doesn't give her money for food or bills or even medication.  Reported she hasn't been on her medications for about 3 months.  Reported she used to go to the behavioral health center but hasn't been there in a while.   Reported she was previously on Seroquel but was taken off of that and put on Abilify instead.  Reported she used to receive the Abilify LAI but "it slowed me down too much".  Discussed inpatient psychiatric admission at this time for further evaluation and stabilization which pt is in agreement with.  Recommend Seroquel 100 mg po at bedtime instead of Abilify as well as Prozac 10 mg po daily for depression and anxiety.  Pt open to these recommendations.    PAST PSYCHIATRIC  HISTORY   Otherwise as per HPI above.  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) 07/19/2017   Hypertension    Hypertensive emergency 05/31/2022   Ischemic stroke (HCC) 01/01/2020   subacute right middle cerebellar peduncle and pons infarction   Left-sided weakness 01/27/2022   Head CT with remote right occipital infarct which is consistent with old left-sided weakness   MDD (major depressive disorder), recurrent severe, 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 Swallow whole.   gabapentin (NEURONTIN) 100 MG capsule Take 1 capsule (100 mg total) by mouth 3 (three) times daily for pain scale 4-7.   ferrous sulfate 325 (65 FE) MG tablet Take 1 tablet (325 mg total) by mouth 3 (three) times daily with meals.   ARIPiprazole (ABILIFY) 10 MG tablet Take 1 tablet (10 mg total) by mouth at bedtime.   hydrOXYzine (ATARAX) 25 MG tablet Take 1 tablet (25 mg total) by mouth 3 (three) times daily as needed for anxiety.   nicotine polacrilex (NICORETTE) 2 MG gum Take 1 each (2 mg total) by mouth every 2 (two) hours as needed for  Smoking Cravings   PARoxetine (PAXIL) 20 MG tablet Take 1 tablet (20 mg total) by mouth daily.     ALLERGIES  Allergies  Allergen Reactions   Tomato Anaphylaxis    Pt reports this as an allergy, but has been eating ketchup and pizza on previous visits w/o any s/s of allergic reaction/anaphylaxis.    Pork-Derived Products Itching   Hydrocodone Itching   Latex Itching and Rash    SOCIAL & SUBSTANCE USE HISTORY  Social History   Socioeconomic History   Marital status: Single    Spouse name: Not on file   Number of children: Not on file   Years of education: Not on file   Highest education level: Not on file  Occupational History   Not on file  Tobacco Use   Smoking status: Former    Types: Cigarettes    Start date: 06/16/1998    Passive exposure: Current   Smokeless tobacco: Former  Building services engineer status: Every Day  Substance and Sexual Activity   Alcohol use: Not Currently   Drug use: Yes    Types: Marijuana, Cocaine    Comment: occassionally   Sexual activity: Not on file  Other Topics Concern   Not on file  Social History Narrative   ** Merged History Encounter **  Social Determinants of Health   Financial Resource Strain: Not on file  Food Insecurity: Food Insecurity Present (02/09/2023)   Hunger Vital Sign    Worried About Running Out of Food in the Last Year: Often true    Ran Out of Food in the Last Year: Often true  Transportation Needs: Unmet Transportation Needs (02/09/2023)   PRAPARE - Administrator, Civil Service (Medical): Yes    Lack of Transportation (Non-Medical): Yes  Physical Activity: Not on file  Stress: Not on file  Social Connections: Not on file   Social History   Tobacco Use  Smoking Status Former   Types: Cigarettes   Start date: 06/16/1998   Passive exposure: Current  Smokeless Tobacco Former   Social History   Substance and Sexual Activity  Alcohol Use Not Currently   Social History   Substance and  Sexual Activity  Drug Use Yes   Types: Marijuana, Cocaine   Comment: occassionally    Additional pertinent information Long hx of mental illness.  FAMILY HISTORY  Family History  Problem Relation Age of Onset   Hypertension Mother    CAD Mother 15       died of MI at age 3   Hypertension Father    Family Psychiatric History (if known):  Unknown  MENTAL STATUS EXAM (MSE)  Presentation  General Appearance:  Appropriate for Environment  Eye Contact: Good  Speech: Pressured  Speech Volume: Increased  Handedness: Right   Mood and Affect  Mood: Depressed  Affect: Depressed   Thought Process  Thought Processes: Coherent; Goal Directed  Descriptions of Associations: Intact  Orientation: Full (Time, Place and Person)  Thought Content: Logical  History of Schizophrenia/Schizoaffective disorder: Yes  Duration of Psychotic Symptoms: Greater than six months  Hallucinations:Hallucinations: Auditory Description of Auditory Hallucinations: Reported CAH telling her to kill herself  Ideas of Reference: None  Suicidal Thoughts:Suicidal Thoughts: Yes, Active SI Active Intent and/or Plan: With Plan  Homicidal Thoughts:Homicidal Thoughts: No   Sensorium  Memory: Immediate Good; Recent Good; Remote Fair  Judgment: Fair  Insight: Fair   Executive Functions  Concentration: Good  Attention Span: Good  Recall: Dudley Major of Knowledge: Fair  Language: Fair   Psychomotor Activity  Psychomotor Activity:Psychomotor Activity: Normal  Assets  Assets: Housing; Resilience; Desire for Improvement   Sleep  Sleep:Sleep: Fair   VITALS  Blood pressure (!) 178/116, pulse 60, temperature 98.6 F (37 C), temperature source Oral, resp. rate 18, SpO2 100%.  LABS  Admission on 04/18/2023  Component Date Value Ref Range Status   Sodium 04/18/2023 140  135 - 145 mmol/L Final   Potassium 04/18/2023 3.3 (L)  3.5 - 5.1 mmol/L Final   Chloride  04/18/2023 106  98 - 111 mmol/L Final   CO2 04/18/2023 23  22 - 32 mmol/L Final   Glucose, Bld 04/18/2023 128 (H)  70 - 99 mg/dL Final   Glucose reference range applies only to samples taken after fasting for at least 8 hours.   BUN 04/18/2023 16  6 - 20 mg/dL Final   Creatinine, Ser 04/18/2023 1.10 (H)  0.44 - 1.00 mg/dL Final   Calcium 64/40/3474 9.0  8.9 - 10.3 mg/dL Final   Total Protein 25/95/6387 7.4  6.5 - 8.1 g/dL Final   Albumin 56/43/3295 3.4 (L)  3.5 - 5.0 g/dL Final   AST 18/84/1660 15  15 - 41 U/L Final   ALT 04/18/2023 12  0 - 44 U/L Final  Alkaline Phosphatase 04/18/2023 59  38 - 126 U/L Final   Total Bilirubin 04/18/2023 0.3  0.3 - 1.2 mg/dL Final   GFR, Estimated 04/18/2023 >60  >60 mL/min Final   Comment: (NOTE) Calculated using the CKD-EPI Creatinine Equation (2021)    Anion gap 04/18/2023 11  5 - 15 Final   Performed at [facility], [address], [place], [place] [address]   Alcohol, Ethyl (B) 04/18/2023 <10  <10 mg/dL Final   Comment: (NOTE) Lowest detectable limit for serum alcohol is 10 mg/dL.  For medical purposes only. Performed at [facility], [address], [place], [place] [address]    Salicylate Lvl 04/18/2023 <7.0 (L)  7.0 - 30.0 mg/dL Final   Performed at [facility], [address] [place], [place] [address]   Acetaminophen (Tylenol), Serum 04/18/2023 <10 (L)  10 - 30 ug/mL Final   Comment: (NOTE) Therapeutic concentrations vary significantly. A range of 10-30 ug/mL  may be an effective concentration for many patients. However, some  are best treated at concentrations outside of this range. Acetaminophen concentrations >150 ug/mL at 4 hours after ingestion  and >50 ug/mL at 12 hours after ingestion are often associated with  toxic reactions.  Performed at [facility], [address], [place], [place] [address]    WBC 04/18/2023 7.3  4.0 - 10.5 K/uL Final   RBC 04/18/2023 4.03  3.87 - 5.11 MIL/uL Final    Hemoglobin 04/18/2023 8.5 (L)  12.0 - 15.0 g/dL Final   Comment: Reticulocyte Hemoglobin testing may be clinically indicated, consider ordering this additional test [id number]    HCT 04/18/2023 29.9 (L)  36.0 - 46.0 % Final   MCV 04/18/2023 74.2 (L)  80.0 - 100.0 fL Final   MCH 04/18/2023 21.1 (L)  26.0 - 34.0 pg Final   MCHC 04/18/2023 28.4 (L)  30.0 - 36.0 g/dL Final   RDW 41/32/4401 19.0 (H)  11.5 - 15.5 % Final   Platelets 04/18/2023 324  150 - 400 K/uL Final   nRBC 04/18/2023 0.0  0.0 - 0.2 % Final   Performed at [facility], [address] [place], [place] [address]   Opiates 04/18/2023 NONE DETECTED  NONE DETECTED Final   Cocaine 04/18/2023 POSITIVE (A)  NONE DETECTED Final   Benzodiazepines 04/18/2023 NONE DETECTED  NONE DETECTED Final   Amphetamines 04/18/2023 NONE DETECTED  NONE DETECTED Final   Tetrahydrocannabinol 04/18/2023 NONE DETECTED  NONE DETECTED Final   Barbiturates 04/18/2023 NONE DETECTED  NONE DETECTED Final   Comment: (NOTE) DRUG SCREEN FOR MEDICAL PURPOSES ONLY.  IF CONFIRMATION IS NEEDED FOR ANY PURPOSE, NOTIFY LAB WITHIN 5 DAYS.  LOWEST DETECTABLE LIMITS FOR URINE DRUG SCREEN Drug Class                     Cutoff (ng/mL) Amphetamine and metabolites    1000 Barbiturate and metabolites    200 Benzodiazepine                 200 Opiates and metabolites        300 Cocaine and metabolites        300 THC                            50 Performed at [facility], [address] [place], [place] [address]    Preg, Serum 04/18/2023 NEGATIVE  NEGATIVE Final  Comment:        THE SENSITIVITY OF THIS METHODOLOGY IS >10 mIU/mL. Performed at [facility], [address], [place], [place] [address]     PSYCHIATRIC REVIEW OF SYSTEMS (ROS)  ROS: Notable for the following relevant positive findings: Review of Systems  Constitutional: Negative.   HENT: Negative.    Eyes: Negative.   Respiratory: Negative.    Cardiovascular: Negative.    Gastrointestinal: Negative.   Genitourinary: Negative.   Musculoskeletal: Negative.   Skin: Negative.   Neurological: Negative.   Endo/Heme/Allergies: Negative.   Psychiatric/Behavioral:  Positive for depression, hallucinations and suicidal ideas. The patient has insomnia.     Additional findings:      Musculoskeletal: No abnormal movements observed      Gait & Station: Laying/Sitting      Pain Screening: Denies      Nutrition & Dental Concerns: Decrease in food intake and/or loss of appetite  RISK FORMULATION/ASSESSMENT  Is the patient experiencing any suicidal or homicidal ideations: Yes       Explain if yes: Active SI with plan  Protective factors considered for safety management: Willing to get help, children  Risk factors/concerns considered for safety management:  Depression Substance abuse/dependence Physical illness/chronic pain Access to lethal means Hopelessness Unmarried  Is there a safety management plan with the patient and treatment team to minimize risk factors and promote protective factors: Yes           Explain: Medication, inpatient psychiatric admission Is crisis care placement or psychiatric hospitalization recommended: Yes     Based on my current evaluation and risk assessment, patient is determined at this time to be at:  High risk  *RISK ASSESSMENT Risk assessment is a dynamic process; it is possible that this patient's condition, and risk level, may change. This should be re-evaluated and managed over time as appropriate. Please re-consult psychiatric consult services if additional assistance is needed in terms of risk assessment and management. If your team decides to discharge this patient, please advise the patient how to best access emergency psychiatric services, or to call 911, if their condition worsens or they feel unsafe in any way.   Harlene Salts, NP Telepsychiatry Consult Services

## 2023-04-19 NOTE — ED Notes (Signed)
Pt has all belongings. Contacted ED pharmacist to find out if [facility] pharmacy is open d/t pt meds not having been delivered to ED yet, per pharmacist, [facility] pharmacy won't be open until in the morning. Updated CN.

## 2023-04-19 NOTE — ED Triage Notes (Signed)
Pt from home , c/o depression, out of depression meds, wants to hurt self, c/o bilateral leg pain   Vs ems 182/100, 66HR, 96 RA

## 2023-04-19 NOTE — ED Notes (Signed)
Called kitchen for breakfast tray

## 2023-04-19 NOTE — Progress Notes (Signed)
CSW sent additional BH information to [facility] per their request for review.    Damita Dunnings, MSW, LCSW-A  11:05 AM 04/19/2023

## 2023-04-19 NOTE — ED Notes (Signed)
Pt upset that she is not allowed to stay overnight to get her meds from TOC. Pt started wailing, refused VS, said she was leaving to go to another hospital.

## 2023-04-19 NOTE — ED Notes (Signed)
Pt saying that she would rather be discharged than go to [facility]. Notified BH team.

## 2023-04-19 NOTE — ED Provider Notes (Signed)
Emergency Medicine Observation Re-evaluation Note  Kaitlyn Gray is a 46 y.o. female, seen on rounds today.  Pt initially presented to the ED for complaints of chronic intermittent SI, substance use disorder, malingering behavior.  Pt here with recurrence of similar symptoms. No new c/o this AM.   Physical Exam  BP (!) 182/98 (BP Location: Right Arm)   Pulse 67   Temp 98.2 F (36.8 C) (Oral)   Resp 18   LMP  (LMP Unknown)   SpO2 100%  Physical Exam General: calm, nad.  Cardiac: regular rate.  Lungs: breathing comfortably. Psych: normal mood and affect. Pt does not appear acutely depressed or despondent. No intent or desire to end life - but rather appears to be seeking inpatient place to stay (despite seemingly not having achieved any lasting therapeutic benefit from prior inpatient bH stays). Pt does not appear to be responding to internal stimuli - no acute psychosis noted.   ED Course / MDM    I have reviewed the labs performed to date as well as medications administered while in observation.  Recent changes in the last 24 hours include ED obs, reassessment.   Plan  BH team re-eval and placement remains pending.     Cathren Laine, MD 04/19/23 [phone]

## 2023-04-19 NOTE — ED Notes (Signed)
Tele psych at bedside for assessment 

## 2023-04-19 NOTE — Progress Notes (Signed)
Patient has been denied by [facility] due to no appropriate beds available. Patient meets BH inpatient criteria per Paschal Dopp, NP. Patient has been faxed out to the following facilities:   [facility]  [address] [place] [address] [phone]  [facility]  [address] [place] [address] [phone]  [facility] [address]  [place] [address] [phone]  [facility]  [address] [place] [address] [phone]  [facility]  [address] [place] [address] [phone]  [facility] [address]  [address] [place] [address] [phone]  [facility] Patient Placement  [facility], [place] [phone]  [facility]  [address], [place] [address] [phone]  [facility]  [address] [place] [address] [phone]  [facility]  [address] [place] [address] [phone]  [facility]  [address] [place] [address] [phone]  [facility]  [address] [facility] [address] [place] [address] [phone]  [facility]  [address] [place], [place] [address] [phone]  [facility]  [address] [place], [place] [address] [phone]  [facility]  [address] [place] [address] [phone]  [facility]  [address] [place] [address] [phone]  [facility]  [address] [place] [address] [phone]  [facility]  [address], [place] [address] [phone]   Damita Dunnings, MSW, LCSW-A  10:16 AM 04/19/2023

## 2023-04-19 NOTE — Discharge Instructions (Signed)
Safety Plan DECKLYN HORNIK will reach out to Lester Kinsman (aunt), call 911 or call mobile crisis, or go to nearest emergency room if condition worsens or if suicidal thoughts become appreciable Patients' will follow up with [facility]UC/substance abuse resources  The suicide prevention education provided includes the following: Suicide risk factors Suicide prevention and interventions National Suicide Hotline telephone number [facility] assessment telephone number [facility] Emergency Assistance 911 [facility] and/or Residential Mobile Crisis Unit telephone number

## 2023-04-19 NOTE — ED Notes (Signed)
Patient agitated and screaming while getting her BP done. Patient verbally abusive to staff.

## 2023-04-19 NOTE — ED Notes (Signed)
Pt reports anxiety, prn med for anxiety given

## 2023-04-19 NOTE — Progress Notes (Signed)
[facility] LCSW Progress Note   04/19/2023    11:05 AM  Kaitlyn Gray   [id number]   Type of Contact and Topic:  Psychiatric Bed Placement   Pt accepted to [facility]   Patient meets inpatient criteria per Paschal Dopp, NP  The attending provider will be Dr. Loni Beckwith   Call report to [phone]  Denton Ar, RN @ [facility] ED notified.     Pt scheduled  to arrive at [facility] AFTER 0900.    Damita Dunnings, MSW, LCSW-A  11:06 AM 04/19/2023

## 2023-04-20 ENCOUNTER — Other Ambulatory Visit (HOSPITAL_COMMUNITY): Payer: Self-pay

## 2023-04-20 NOTE — ED Provider Notes (Signed)
[place] EMERGENCY DEPARTMENT AT [facility] Provider Note   CSN: [id number] Arrival date & time: 04/19/23  2018     History  Chief Complaint  Patient presents with   Leg Pain   Depression   Suicidal    Kaitlyn Gray is a 47 y.o. female.  Patient has a history of bipolar, schizophrenia, she was just evaluated today by behavioral health and it was felt she could be discharged home and she was not suicidal.  Patient presented here for "suicidal ideations  The history is provided by the patient and medical records.  Depression This is a recurrent problem. The current episode started more than 1 week ago. The problem occurs constantly. The problem has not changed since onset.Pertinent negatives include no chest pain, no abdominal pain and no headaches. Nothing aggravates the symptoms. Nothing relieves the symptoms. She has tried nothing for the symptoms.       Home Medications Prior to Admission medications   Medication Sig Start Date End Date Taking? Authorizing Provider  QUEtiapine (SEROQUEL) 100 MG tablet Take 1 tablet (100 mg total) by mouth at bedtime. 04/19/23  Yes Bethann Berkshire, MD  amLODipine (NORVASC) 10 MG tablet Take 1 tablet (10 mg total) by mouth daily. 06/04/22 12/24/22  [place], Alvira Philips, DO  ARIPiprazole (ABILIFY) 10 MG tablet Take 1 tablet (10 mg total) by mouth at bedtime. 02/12/23   Lauro Franklin, MD  aspirin EC 81 MG tablet Take 81 mg by mouth daily. Swallow whole.    [provider]  ferrous sulfate 325 (65 FE) MG tablet Take 1 tablet (325 mg total) by mouth 3 (three) times daily with meals. 01/21/23 04/21/23  Cathren Laine, MD  FLUoxetine (PROZAC) 10 MG capsule Take 1 capsule (10 mg total) by mouth daily. 04/19/23   Lenox Ponds, NP  gabapentin (NEURONTIN) 100 MG capsule Take 1 capsule (100 mg total) by mouth 3 (three) times daily for pain scale 4-7. 12/01/22     hydrOXYzine (ATARAX) 25 MG tablet Take 1 tablet (25 mg total) by mouth  3 (three) times daily as needed for anxiety. 02/12/23   Lauro Franklin, MD  losartan (COZAAR) 100 MG tablet Take 1 tablet (100 mg total) by mouth daily. 06/04/22 02/09/23  [place], Alvira Philips, DO  metFORMIN (GLUCOPHAGE) 500 MG tablet Take 1 tablet (500 mg total) by mouth 2 (two) times daily with a meal. 06/04/22 12/24/22  [place], Alvira Philips, DO  nicotine polacrilex (NICORETTE) 2 MG gum Take 1 each (2 mg total) by mouth every 2 (two) hours as needed for Smoking Cravings 02/12/23   Lauro Franklin, MD  pantoprazole (PROTONIX) 40 MG tablet Take 1 tablet (40 mg total) by mouth daily. 06/04/22 12/24/22  [place], Alvira Philips, DO  PARoxetine (PAXIL) 20 MG tablet Take 1 tablet (20 mg total) by mouth daily. 02/13/23   Lauro Franklin, MD  rosuvastatin (CRESTOR) 40 MG tablet Take 1 tablet (40 mg total) by mouth daily. 06/04/22 12/24/22  [place], Eric J, DO      Allergies    Tomato, Pork-derived products, Hydrocodone, and Latex    Review of Systems   Review of Systems  Constitutional:  Negative for appetite change and fatigue.  HENT:  Negative for congestion, ear discharge and sinus pressure.   Eyes:  Negative for discharge.  Respiratory:  Negative for cough.   Cardiovascular:  Negative for chest pain.  Gastrointestinal:  Negative for abdominal pain and diarrhea.  Genitourinary:  Negative for frequency  and hematuria.  Musculoskeletal:  Negative for back pain.  Skin:  Negative for rash.  Neurological:  Negative for seizures and headaches.  Psychiatric/Behavioral:  Positive for depression and dysphoric mood. Negative for hallucinations.     Physical Exam Updated Vital Signs BP (!) 197/105 Comment: Pt was aggitated, moving and had on a coat. only way to get her blood pressure.  Pulse 71   Temp 97.9 F (36.6 C)   Resp 16   LMP  (LMP Unknown)   SpO2 100%  Physical Exam Vitals and nursing note reviewed.  Constitutional:      Appearance: Normal appearance. She is well-developed.  HENT:      Head: Normocephalic.     Nose: Nose normal.  Eyes:     General: No scleral icterus.    Conjunctiva/sclera: Conjunctivae normal.  Neck:     Thyroid: No thyromegaly.  Cardiovascular:     Rate and Rhythm: Normal rate and regular rhythm.     Heart sounds: No murmur heard.    No friction rub. No gallop.  Pulmonary:     Breath sounds: No stridor. No wheezing or rales.  Chest:     Chest wall: No tenderness.  Abdominal:     General: There is no distension.     Tenderness: There is no abdominal tenderness. There is no rebound.  Musculoskeletal:        General: Normal range of motion.     Cervical back: Neck supple.  Lymphadenopathy:     Cervical: No cervical adenopathy.  Skin:    Findings: No erythema or rash.  Neurological:     Mental Status: She is alert and oriented to person, place, and time.     Motor: No abnormal muscle tone.     Coordination: Coordination normal.  Psychiatric:     Comments: Patient not suicidal or homicidal     ED Results / Procedures / Treatments   Labs (all labs ordered are listed, but only abnormal results are displayed) Labs Reviewed - No data to display  EKG None  Radiology No results found.  Procedures Procedures    Medications Ordered in ED Medications - No data to display  ED Course/ Medical Decision Making/ A&P                                 Medical Decision Making Risk Prescription drug management.   Patient with schizophrenia and bipolar and depression.  She is not suicidal and she will follow-up as instructed by behavioral health earlier today        Final Clinical Impression(s) / ED Diagnoses Final diagnoses:  Schizophrenia, unspecified type (HCC)    Rx / DC Orders ED Discharge Orders          Ordered    QUEtiapine (SEROQUEL) 100 MG tablet  Daily at bedtime        04/19/23 2102              Bethann Berkshire, MD 04/20/23 1149

## 2023-05-01 ENCOUNTER — Emergency Department (HOSPITAL_COMMUNITY)
Admission: EM | Admit: 2023-05-01 | Discharge: 2023-05-01 | Disposition: A | Discharge status: Home / Self Care | Payer: MEDICAID | Attending: Emergency Medicine | Admitting: Emergency Medicine

## 2023-05-01 ENCOUNTER — Other Ambulatory Visit: Payer: Self-pay

## 2023-05-01 DIAGNOSIS — R4701 Aphasia: Secondary | ICD-10-CM | POA: Insufficient documentation

## 2023-05-01 DIAGNOSIS — Z8673 Personal history of transient ischemic attack (TIA), and cerebral infarction without residual deficits: Secondary | ICD-10-CM | POA: Diagnosis not present

## 2023-05-01 DIAGNOSIS — Z91040 Latex allergy status: Secondary | ICD-10-CM | POA: Diagnosis not present

## 2023-05-01 DIAGNOSIS — Z7984 Long term (current) use of oral hypoglycemic drugs: Secondary | ICD-10-CM | POA: Insufficient documentation

## 2023-05-01 DIAGNOSIS — E119 Type 2 diabetes mellitus without complications: Secondary | ICD-10-CM | POA: Diagnosis not present

## 2023-05-01 DIAGNOSIS — R531 Weakness: Secondary | ICD-10-CM | POA: Insufficient documentation

## 2023-05-01 DIAGNOSIS — Z7982 Long term (current) use of aspirin: Secondary | ICD-10-CM | POA: Insufficient documentation

## 2023-05-01 DIAGNOSIS — Z79899 Other long term (current) drug therapy: Secondary | ICD-10-CM | POA: Diagnosis not present

## 2023-05-01 NOTE — ED Notes (Signed)
This RN attempted to d/c the pt. Pt became verbally aggressive with staff and started ramming her walker into the glass door. Pt was asked by security to stop and asked to leave. Pt continues to yell and be verbally aggressive towards all staff. Pt seen ambulating out of the department, escorted by security.

## 2023-05-01 NOTE — ED Triage Notes (Signed)
Pt BIB [facility] from home. Pt reports slurred speech. EMS noted a large empty bottle of vodka and cocaine on the table at the pts house; pt denies use. Pt was able to ambulate to the stretcher with no issue.

## 2023-05-01 NOTE — ED Provider Notes (Signed)
At time of discharge patient was walking around without any difficulty.  She uses her walker with a steady gait  she is demanding to see a psychiatrist, but has no signs of psychosis and no SI at this time. Patient was provided with a bus pass and advised that she can follow-up with behavioral health urgent care 24 hours a day     Zadie Rhine, MD 05/01/23 [phone]

## 2023-05-01 NOTE — ED Provider Notes (Signed)
[place] EMERGENCY DEPARTMENT AT [facility] Provider Note   CSN: [id number] Arrival date & time: 05/01/23  0347     History  Chief Complaint  Patient presents with   Aphasia    Kaitlyn Gray is a 47 y.o. female.  HPI Patient with extensive history including bipolar disorder, substance use disorder, hypertension, previous CVA presents with multiple complaints.  Patient called out for ambulance due to hearing and seeing things.  EMS noted the patient appeared to have weakness.  She also had dysarthria.  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) 07/19/2017   Hypertension    Hypertensive emergency 05/31/2022   Ischemic stroke (HCC) 01/01/2020   subacute right middle cerebellar peduncle and pons infarction   Left-sided weakness 01/27/2022   Head CT with remote right occipital infarct which is consistent with old left-sided weakness   MDD (major depressive disorder), recurrent severe, 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 Authorizing Provider  amLODipine (NORVASC) 10 MG tablet Take  1 tablet (10 mg total) by mouth daily. 06/04/22 12/24/22  [place], Alvira Philips, DO  ARIPiprazole (ABILIFY) 10 MG tablet Take 1 tablet (10 mg total) by mouth at bedtime. 02/12/23   Lauro Franklin, MD  aspirin EC 81 MG tablet Take 81 mg by mouth daily. Swallow whole.    [provider]  ferrous sulfate 325 (65 FE) MG tablet Take 1 tablet (325 mg total) by mouth 3 (three) times daily with meals. 01/21/23 04/21/23  Cathren Laine, MD  FLUoxetine (PROZAC) 10 MG capsule Take 1 capsule (10 mg total) by mouth daily. 04/19/23   Lenox Ponds, NP  gabapentin (NEURONTIN) 100 MG capsule Take 1 capsule (100 mg total) by mouth 3 (three) times daily for pain scale 4-7. 12/01/22     hydrOXYzine (ATARAX) 25 MG tablet Take 1 tablet (25 mg total) by mouth 3 (three) times daily as needed for anxiety. 02/12/23   Lauro Franklin, MD  losartan (COZAAR) 100 MG tablet Take 1 tablet (100 mg total) by mouth daily. 06/04/22 02/09/23  [place], Alvira Philips, DO  metFORMIN (GLUCOPHAGE) 500 MG tablet Take 1 tablet (500 mg total) by mouth 2 (two) times daily with a meal. 06/04/22 12/24/22  [place], Alvira Philips, DO  nicotine polacrilex (NICORETTE) 2 MG gum Take 1 each (2 mg total) by mouth every 2 (two) hours as needed for Smoking Cravings 02/12/23   Lauro Franklin, MD  pantoprazole (PROTONIX) 40 MG tablet Take 1 tablet (40 mg total) by mouth daily. 06/04/22 12/24/22  [place], Alvira Philips, DO  PARoxetine (PAXIL) 20 MG tablet  Take 1 tablet (20 mg total) by mouth daily. 02/13/23   Lauro Franklin, MD  QUEtiapine (SEROQUEL) 100 MG tablet Take 1 tablet (100 mg total) by mouth at bedtime. 04/19/23   Bethann Berkshire, MD  rosuvastatin (CRESTOR) 40 MG tablet Take 1 tablet (40 mg total) by mouth daily. 06/04/22 12/24/22  [place], Eric J, DO      Allergies    Tomato, Pork-derived products, Hydrocodone, and Latex    Review of Systems   Review of Systems  Psychiatric/Behavioral:  Positive for hallucinations.     Physical Exam Updated  Vital Signs BP (!) 151/108 (BP Location: Right Arm)   Pulse (!) 59   Temp (!) 97.5 F (36.4 C) (Oral)   Resp 18   SpO2 100%  Physical Exam CONSTITUTIONAL: Chronically ill-appearing, anxious, disheveled and appears intoxicated HEAD: Normocephalic/atraumatic EYES: PERRL ENMT: Mucous membranes moist NECK: supple no meningeal signs CV: S1/S2 noted, no murmurs/rubs/gallops noted LUNGS: Lungs are clear to auscultation bilaterally, no apparent distress ABDOMEN: soft, nontender NEURO: Pt is awake/alert.  Speech is slurred likely due to intoxication patient reports difficulty moving her left leg, but this is baseline.  She is able to move all other extremities EXTREMITIES: pulses normal/equal, full ROM No deformities SKIN: warm, color normal PSYCH: Anxious  ED Results / Procedures / Treatments   Labs (all labs ordered are listed, but only abnormal results are displayed) Labs Reviewed - No data to display  EKG None  Radiology No results found.  Procedures Procedures    Medications Ordered in ED Medications - No data to display  ED Course/ Medical Decision Making/ A&P Clinical Course as of 05/01/23 0347  Fri May 01, 2023  9811 Patient with extensive history including previous CVA, bipolar and substance use disorder presents for reportedly had hallucinations.  EMS reports patient had a bottle of vodka at the house.  She has been seen 31 times in the ER in the past 6 months.  She has had multiple MRIs of her brain over the past several months.  Overall suspect her neurostatus is at baseline as she has had previous left-sided weakness in the past.  [DW]  0345 Patient reports she called 911 initially because she was hearing and seeing things.  Patient is not exhibiting signs of psychosis but does appear intoxicated Her speech is slurred, but that she does not appear to have any new neurodeficits from her baseline Patient is had multiple ER evaluations and neurologic evaluations  previously. At this point patient will be discharged and can follow-up as an outpatient [DW]    Clinical Course User Index [DW] Zadie Rhine, MD                                 Medical Decision Making          Final Clinical Impression(s) / ED Diagnoses Final diagnoses:  Weakness    Rx / DC Orders ED Discharge Orders     None         Zadie Rhine, MD 05/01/23 [phone]

## 2023-05-01 NOTE — Discharge Instructions (Signed)
Substance Abuse Treatment Programs ° °Intensive Outpatient Programs °[facility]     °[address]      °[place], [place]                   °[phone]      ° °[facility] °[address] °[place], [place] °[phone] ° °[facility]     °(Inpatient and outpatient)     °[address]           °[phone]   ° °[facility] °[phone] (Suboxone and Methadone) ° °[address]      °[place], [place] [address]      °[phone]      ° °[address] °[place], [place] °852-3033 ° °[facility] (Outpatient/Inpatient, Chemical)    °(insurance only) [phone]      °       °[facility] (Groups & Residential) °[place], [place] °[phone] ° °   °[facility]     °[address]     °[place], [place]      °[phone]      ° °[facility] (for caregivers and family) °[address] °[place], [place] °336-299-4655 ° ° ° ° ° °Residential Treatment Programs °[facility]      °[address], [place], [place] [address]  °[phone]      ° °[facility] °[address] [place], [place] [address] °[phone] ° °[facility]        °[phone]      ° °[facility] °[phone] ° °[facility]             °[address]                                         °[place], [place]                                                °877-615-2722 or [phone]                              ° °[facility] [place] °[address] °[place], [address] °[phone] ° °[facility]    °[address]      °[place], [place]     °[phone]      ° °[facility] °[address] °, [place] °[phone] ° °[facility]   °[address]     °[place], [place] [address]     °[phone]      °Admissions: 8am-3pm M-F ° °[facility] °[address] °[place],  [place] °[phone] ° °[facility]: Residential Program (90 Days)   °[place], [place]      °336-725-8389 or [phone]    ° °[facility] °[place], [place] °(Walk in Hours over the weekend or by referral) ° °[facility] °[address], [place], [place] [address] °([phone] ° °Crisis Mobile: [facility]:  1-877-626-1772 (for crisis response 24 hours a day) °[facility] Hotline:      1-800-256-2452 °Outpatient Psychiatry and Counseling ° °[facility]: Mobile Crisis   Management 24 hours:  1-877-626-1772 ° °[facility] sliding scale fee and walk in schedule: M-F 8am-12pm/1pm-3pm °[address]  °[place], [place] [address] °[phone] ° °[facility] °[address] °[place], [place] [address] °[phone] ° °[facility] ([facility] known [facility])- new patient walk-in appointments available Monday - Friday 8am -3pm.          °[address] °[place], [place] [address] °336-676-6840 or crisis line- 336-676-6905 ° °[facility]/ Intensive Outpatient Therapy Program °[address] °[place], [place] [address] °336-832-9804 ° °[facility]                  °Crisis Services      °[phone]      °[address]     °[place], [place] [address]                ° °[facility]   °[facility] °[phone] °[address] °[place], [place] [address] ° ° °[facility]          °[address]  °[place], [place] [address]       °[phone] ° °[facility] °[address] °[place], [place] [address] °336-292-1510 ° °[facility]    °[address]    °[place], [place] [address]     °[phone]      ° °Parish McKinney, MD     °[address]     °[place] [address]     °[phone]     °  °[facility] °[address] °[place] [address] ° °[facility]     °[address]     °[place], [place]      °336-542-2076      ° °[facility] °Shamsher Ahluwalia, MD °[address] °[place], [place] [address] °[phone] ° °[facility]     °[address]     °[place], [place] [address]     °336-274-1237      ° °[facility] °[address] °[place], [place] [address] °336-854-4450 °Resources for Temporary Residential Assistance/Crisis Centers ° °DAY CENTERS °[facility] °M-F 8am-3pm   °[address] [place], [place] [address]   336-332-0824 °Services include: laundry, barbering, support groups, case management, phone  & computer access, showers, AA/NA mtgs, mental health/substance abuse nurse, job skills class, disability information, VA assistance, spiritual classes, etc.  ° °HOMELESS SHELTERS ° °[facility]     °[facility]   °[address], [place]     °[phone]       °       °[facility] (women and children)       °[address] °[place], [place] [address] °336-275-0820 °[email or web address] for application and process °Application Required ° °[facility]   °[address]    °[place] [address]     °[phone]       °             °[facility] °[address] °[place],  [address] °336.273.5572 °336-235-0363(schedule application appt.) °Application Required ° °[facility] (women only)    °[address]     °[place],  [address]     °336-884-1039      °  Intake starts 6pm daily °Need valid ID, SSC, & Police report °[facility] [place] °[address] °[place], [place] °336-881-5420 °Application Required ° °[facility] (men only)     °[address]      °[place], [place]     °[phone]      ° °[facility] °(Pregnant women only) °[address] °[place], [place] °336-275-0206 ° °[facility]      °[address]      °[place], [place] [address]     °[phone]      °       °[facility] °[address] °[place], [place] °336-723-1848 °90 day commitment/SA/Application process ° °[facility](men only)     °[address]     °[place], [place]     °[phone]       °Check-in at 7pm     °       °[facility] °[address] °[place], [place] [address] °336-248-6684 °Men/Women/Women and Children must be there by 7 pm ° °[facility] °[place], [place] °336-722-8721                ° °

## 2023-05-04 ENCOUNTER — Emergency Department (HOSPITAL_COMMUNITY): Payer: MEDICAID

## 2023-05-04 ENCOUNTER — Other Ambulatory Visit (HOSPITAL_BASED_OUTPATIENT_CLINIC_OR_DEPARTMENT_OTHER): Payer: Self-pay

## 2023-05-04 ENCOUNTER — Emergency Department (HOSPITAL_COMMUNITY)
Admission: EM | Admit: 2023-05-04 | Discharge: 2023-05-06 | Disposition: A | Discharge status: Home / Self Care | Payer: MEDICAID

## 2023-05-04 DIAGNOSIS — F4324 Adjustment disorder with disturbance of conduct: Secondary | ICD-10-CM | POA: Diagnosis present

## 2023-05-04 DIAGNOSIS — J069 Acute upper respiratory infection, unspecified: Secondary | ICD-10-CM | POA: Insufficient documentation

## 2023-05-04 DIAGNOSIS — Z87891 Personal history of nicotine dependence: Secondary | ICD-10-CM | POA: Diagnosis not present

## 2023-05-04 DIAGNOSIS — F1420 Cocaine dependence, uncomplicated: Secondary | ICD-10-CM | POA: Diagnosis not present

## 2023-05-04 DIAGNOSIS — I10 Essential (primary) hypertension: Secondary | ICD-10-CM | POA: Insufficient documentation

## 2023-05-04 DIAGNOSIS — Z79899 Other long term (current) drug therapy: Secondary | ICD-10-CM | POA: Insufficient documentation

## 2023-05-04 DIAGNOSIS — Z7982 Long term (current) use of aspirin: Secondary | ICD-10-CM | POA: Insufficient documentation

## 2023-05-04 DIAGNOSIS — Z91040 Latex allergy status: Secondary | ICD-10-CM | POA: Diagnosis not present

## 2023-05-04 DIAGNOSIS — F1410 Cocaine abuse, uncomplicated: Secondary | ICD-10-CM | POA: Diagnosis present

## 2023-05-04 LAB — COMPREHENSIVE METABOLIC PANEL
ALT: 28 U/L (ref 0–44)
AST: 17 U/L (ref 15–41)
Albumin: 3.7 g/dL (ref 3.5–5.0)
Alkaline Phosphatase: 61 U/L (ref 38–126)
Anion gap: 7 (ref 5–15)
BUN: 21 mg/dL — ABNORMAL HIGH (ref 6–20)
CO2: 24 mmol/L (ref 22–32)
Calcium: 8.6 mg/dL — ABNORMAL LOW (ref 8.9–10.3)
Chloride: 110 mmol/L (ref 98–111)
Creatinine, Ser: 1.04 mg/dL — ABNORMAL HIGH (ref 0.44–1.00)
GFR, Estimated: >60 mL/min (ref >60)
Glucose, Bld: 124 mg/dL — ABNORMAL HIGH (ref 70–99)
Potassium: 3.5 mmol/L (ref 3.5–5.1)
Sodium: 141 mmol/L (ref 135–145)
Total Bilirubin: 0.3 mg/dL (ref <1.2)
Total Protein: 7.6 g/dL (ref 6.5–8.1)

## 2023-05-04 LAB — RAPID URINE DRUG SCREEN, HOSP PERFORMED
Amphetamines: NOT DETECTED
Barbiturates: NOT DETECTED
Benzodiazepines: NOT DETECTED
Cocaine: POSITIVE — AB
Opiates: NOT DETECTED
Tetrahydrocannabinol: NOT DETECTED

## 2023-05-04 LAB — CBC WITH DIFFERENTIAL/PLATELET
Abs Immature Granulocytes: 0.01 10*3/uL (ref 0.00–0.07)
Basophils Absolute: 0 10*3/uL (ref 0.0–0.1)
Basophils Relative: 1 %
Eosinophils Absolute: 0.1 10*3/uL (ref 0.0–0.5)
Eosinophils Relative: 2 %
HCT: 28.9 % — ABNORMAL LOW (ref 36.0–46.0)
Hemoglobin: 8.1 g/dL — ABNORMAL LOW (ref 12.0–15.0)
Immature Granulocytes: 0 %
Lymphocytes Relative: 37 %
Lymphs Abs: 1.6 10*3/uL (ref 0.7–4.0)
MCH: 21.5 pg — ABNORMAL LOW (ref 26.0–34.0)
MCHC: 28 g/dL — ABNORMAL LOW (ref 30.0–36.0)
MCV: 76.7 fL — ABNORMAL LOW (ref 80.0–100.0)
Monocytes Absolute: 0.3 10*3/uL (ref 0.1–1.0)
Monocytes Relative: 8 %
Neutro Abs: 2.3 10*3/uL (ref 1.7–7.7)
Neutrophils Relative %: 52 %
Platelets: 415 10*3/uL — ABNORMAL HIGH (ref 150–400)
RBC: 3.77 MIL/uL — ABNORMAL LOW (ref 3.87–5.11)
RDW: 20.1 % — ABNORMAL HIGH (ref 11.5–15.5)
WBC: 4.3 10*3/uL (ref 4.0–10.5)
nRBC: 0 % (ref 0.0–0.2)

## 2023-05-04 LAB — ETHANOL: Alcohol, Ethyl (B): <10 mg/dL (ref <10)

## 2023-05-04 LAB — HCG, SERUM, QUALITATIVE: Preg, Serum: NEGATIVE

## 2023-05-04 MED ORDER — QUETIAPINE FUMARATE 50 MG PO TABS
50.0000 mg | ORAL_TABLET | Freq: Once | ORAL | Status: AC
  Administered 2023-05-04: 50 mg via ORAL
  Filled 2023-05-04: qty 1

## 2023-05-04 MED ORDER — QUETIAPINE FUMARATE 50 MG PO TABS: 25.0000 mg | ORAL_TABLET | Freq: Every day | ORAL | Status: DC

## 2023-05-04 MED ORDER — QUETIAPINE FUMARATE 50 MG PO TABS
50.0000 mg | ORAL_TABLET | Freq: Every day | ORAL | Status: DC
  Administered 2023-05-04: 50 mg via ORAL
  Filled 2023-05-04: qty 1

## 2023-05-04 MED ORDER — AMLODIPINE BESYLATE 5 MG PO TABS: 10.0000 mg | ORAL_TABLET | Freq: Every day | ORAL | Status: DC

## 2023-05-04 MED ORDER — AMLODIPINE BESYLATE 5 MG PO TABS
10.0000 mg | ORAL_TABLET | Freq: Every day | ORAL | Status: DC
  Administered 2023-05-05: 10 mg via ORAL
  Filled 2023-05-04: qty 2

## 2023-05-04 MED ORDER — QUETIAPINE FUMARATE 100 MG PO TABS: 100.0000 mg | ORAL_TABLET | Freq: Every day | ORAL | Status: DC

## 2023-05-04 MED ORDER — METFORMIN HCL 500 MG PO TABS
500.0000 mg | ORAL_TABLET | Freq: Two times a day (BID) | ORAL | Status: DC
  Administered 2023-05-04: 500 mg via ORAL
  Filled 2023-05-04 (×2): qty 1

## 2023-05-04 MED ORDER — ROSUVASTATIN CALCIUM 20 MG PO TABS
40.0000 mg | ORAL_TABLET | Freq: Every day | ORAL | Status: DC
  Administered 2023-05-04 – 2023-05-05 (×2): 40 mg via ORAL
  Filled 2023-05-04 (×2): qty 2

## 2023-05-04 MED ORDER — PANTOPRAZOLE SODIUM 40 MG PO TBEC
40.0000 mg | DELAYED_RELEASE_TABLET | Freq: Every day | ORAL | Status: DC
  Administered 2023-05-04 – 2023-05-05 (×2): 40 mg via ORAL
  Filled 2023-05-04 (×2): qty 1

## 2023-05-04 MED ORDER — ARIPIPRAZOLE 10 MG PO TABS
10.0000 mg | ORAL_TABLET | Freq: Every day | ORAL | Status: DC
Start: 2023-05-04 — End: 2023-05-06
  Administered 2023-05-04 – 2023-05-05 (×2): 10 mg via ORAL
  Filled 2023-05-04 (×2): qty 1

## 2023-05-04 MED ORDER — GABAPENTIN 100 MG PO CAPS
100.0000 mg | ORAL_CAPSULE | Freq: Three times a day (TID) | ORAL | Status: DC
  Administered 2023-05-04 – 2023-05-05 (×5): 100 mg via ORAL
  Filled 2023-05-04 (×5): qty 1

## 2023-05-04 MED ORDER — HYDROXYZINE HCL 25 MG PO TABS
25.0000 mg | ORAL_TABLET | Freq: Three times a day (TID) | ORAL | Status: DC | PRN
  Administered 2023-05-05: 25 mg via ORAL
  Filled 2023-05-04: qty 1

## 2023-05-04 MED ORDER — LOSARTAN POTASSIUM 50 MG PO TABS: 100.0000 mg | ORAL_TABLET | Freq: Every day | ORAL | Status: DC

## 2023-05-04 MED ORDER — ASPIRIN 81 MG PO TBEC
81.0000 mg | DELAYED_RELEASE_TABLET | Freq: Every day | ORAL | Status: DC
  Administered 2023-05-04 – 2023-05-05 (×2): 81 mg via ORAL
  Filled 2023-05-04 (×2): qty 1

## 2023-05-04 MED ORDER — FLUOXETINE HCL 10 MG PO CAPS: 10.0000 mg | ORAL_CAPSULE | Freq: Every day | ORAL | Status: DC

## 2023-05-04 MED ORDER — FLUOXETINE HCL 10 MG PO CAPS
10.0000 mg | ORAL_CAPSULE | Freq: Every day | ORAL | Status: DC
  Administered 2023-05-04 – 2023-05-05 (×2): 10 mg via ORAL
  Filled 2023-05-04 (×2): qty 1

## 2023-05-04 MED ORDER — LOSARTAN POTASSIUM 50 MG PO TABS
100.0000 mg | ORAL_TABLET | Freq: Every day | ORAL | Status: DC
  Administered 2023-05-05: 100 mg via ORAL
  Filled 2023-05-04 (×2): qty 2

## 2023-05-04 NOTE — ED Provider Notes (Signed)
[facility] EMERGENCY DEPARTMENT AT [facility] Provider Note   CSN: [id number] Arrival date & time: 05/04/23  1429     History  Chief Complaint  Patient presents with   Chest Pain   Suicidal    Kaitlyn Gray is a 47 y.o. female.  47 year old female with past medical history of schizophrenia and SI in the past as well as diabetes and hypertension presents emergency department today with coughing and sneezing.  The patient states that this been going now for the past few days.  She has had a cough and also been sneezing and states that her chest hurts when she coughs.  She came here today for further evaluation regarding this.  She denies any chest pain at rest.  Denies any nausea or diaphoresis.  She states that her discomfort is only when she coughs.  She reports she is also having some rhinorrhea.  She apparently has chronic suicidal ideations.  She states that she was thinking about this earlier today and thought about "lying on the train tracks".  When back through her records it does appear that she has been evaluated here multiple times for similar complaints.  She denies any homicidal ideations.  Denies any visual or auditory hallucinations.   Chest Pain Associated symptoms: cough        Home Medications Prior to Admission medications   Medication Sig Start Date End Date Taking? Authorizing Provider  amLODipine (NORVASC) 10 MG tablet Take 1 tablet (10 mg total) by mouth daily. 06/04/22 12/24/22  [place], Alvira Philips, DO  ARIPiprazole (ABILIFY) 10 MG tablet Take 1 tablet (10 mg total) by mouth at bedtime. 02/12/23   Lauro Franklin, MD  aspirin EC 81 MG tablet Take 81 mg by mouth daily. Swallow whole.    [provider]  ferrous sulfate 325 (65 FE) MG tablet Take 1 tablet (325 mg total) by mouth 3 (three) times daily with meals. 01/21/23 04/21/23  Cathren Laine, MD  FLUoxetine (PROZAC) 10 MG capsule Take 1 capsule (10 mg total) by mouth daily. 04/19/23    Lenox Ponds, NP  gabapentin (NEURONTIN) 100 MG capsule Take 1 capsule (100 mg total) by mouth 3 (three) times daily for pain scale 4-7. 12/01/22     hydrOXYzine (ATARAX) 25 MG tablet Take 1 tablet (25 mg total) by mouth 3 (three) times daily as needed for anxiety. 02/12/23   Lauro Franklin, MD  losartan (COZAAR) 100 MG tablet Take 1 tablet (100 mg total) by mouth daily. 06/04/22 02/09/23  [place], Alvira Philips, DO  metFORMIN (GLUCOPHAGE) 500 MG tablet Take 1 tablet (500 mg total) by mouth 2 (two) times daily with a meal. 06/04/22 12/24/22  [place], Alvira Philips, DO  nicotine polacrilex (NICORETTE) 2 MG gum Take 1 each (2 mg total) by mouth every 2 (two) hours as needed for Smoking Cravings 02/12/23   Lauro Franklin, MD  pantoprazole (PROTONIX) 40 MG tablet Take 1 tablet (40 mg total) by mouth daily. 06/04/22 12/24/22  [place], Alvira Philips, DO  PARoxetine (PAXIL) 20 MG tablet Take 1 tablet (20 mg total) by mouth daily. 02/13/23   Lauro Franklin, MD  QUEtiapine (SEROQUEL) 100 MG tablet Take 1 tablet (100 mg total) by mouth at bedtime. 04/19/23   Bethann Berkshire, MD  rosuvastatin (CRESTOR) 40 MG tablet Take 1 tablet (40 mg total) by mouth daily. 06/04/22 12/24/22  [place], Eric J, DO      Allergies    Tomato, Pork-derived products, Hydrocodone, and  Latex    Review of Systems   Review of Systems  HENT:  Positive for congestion.   Respiratory:  Positive for cough.   Cardiovascular:  Positive for chest pain.  All other systems reviewed and are negative.   Physical Exam Updated Vital Signs BP (!) 172/87 (BP Location: Left Arm)   Pulse (!) 131   Temp 97.6 F (36.4 C) (Oral)   Resp 20   SpO2 100%  Physical Exam Vitals and nursing note reviewed.   Gen: NAD Eyes: PERRL, EOMI HEENT: no oropharyngeal swelling Neck: trachea midline Resp: clear to auscultation bilaterally Card: RRR, no murmurs, rubs, or gallops Abd: nontender, nondistended Extremities: no calf tenderness, no edema Vascular:  2+ radial pulses bilaterally, 2+ DP pulses bilaterally Skin: no rashes Psyc: Tearful, answering questions appropriately   ED Results / Procedures / Treatments   Labs (all labs ordered are listed, but only abnormal results are displayed) Labs Reviewed  COMPREHENSIVE METABOLIC PANEL - Abnormal; Notable for the following components:      Result Value   Glucose, Bld 124 (*)    BUN 21 (*)    Creatinine, Ser 1.04 (*)    Calcium 8.6 (*)    All other components within normal limits  CBC WITH DIFFERENTIAL/PLATELET - Abnormal; Notable for the following components:   RBC 3.77 (*)    Hemoglobin 8.1 (*)    HCT 28.9 (*)    MCV 76.7 (*)    MCH 21.5 (*)    MCHC 28.0 (*)    RDW 20.1 (*)    Platelets 415 (*)    All other components within normal limits  ETHANOL  HCG, SERUM, QUALITATIVE  RAPID URINE DRUG SCREEN, HOSP PERFORMED    EKG EKG Interpretation Date/Time:  Monday May 04 2023 16:13:55 EST Ventricular Rate:  62 PR Interval:    QRS Duration:  84 QT Interval:  302 QTC Calculation: 306 R Axis:   11  Text Interpretation: Junctional rhythm Nonspecific T wave abnormality Abnormal ECG When compared with ECG of 15-Apr-2023 13:00, PREVIOUS ECG IS PRESENT Confirmed by Virgina Norfolk [phone]) on 05/04/2023 4:32:26 PM  Radiology No results found.  Procedures Procedures    Medications Ordered in ED Medications  QUEtiapine (SEROQUEL) tablet 50 mg (has no administration in time range)  FLUoxetine (PROZAC) capsule 10 mg (has no administration in time range)  QUEtiapine (SEROQUEL) tablet 50 mg (has no administration in time range)    ED Course/ Medical Decision Making/ A&P                                 Medical Decision Making 47 year old female with past medical history of diabetes, hypertension, and schizophrenia presenting to the emergency department today with symptoms consistent with likely viral URI.  The patient is also endorsing some SI.  The patient does appear to  complain of this frequently and usually is treated as an outpatient for this.  She is requesting a psychiatric evaluation.  I will consult TTS.  Will obtain basic labs here as well as a chest x-ray and COVID swab in regards to her cough and chest discomfort.  Suspicion for ACS is low at this time.  She is currently voluntary so we will hold off on IVC at this time.  Patient is her labs are reassuring.  Does have labs as well as a COVID swab and chest x-ray with official read pending at the time of signout.  When she is medically cleared to be cleared for psychiatric valuation.  Amount and/or Complexity of Data Reviewed Labs: ordered. Radiology: ordered.           Final Clinical Impression(s) / ED Diagnoses Final diagnoses:  Upper respiratory tract infection, unspecified type    Rx / DC Orders ED Discharge Orders     None         Durwin Glaze, MD 05/04/23 1712
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
## 2023-05-04 NOTE — ED Provider Notes (Signed)
Labs per my review and interpretation are unremarkable..  Patient medically cleared.  No medications ordered.  Awaiting psychiatric evaluation.  This chart was dictated using voice recognition software.  Despite best efforts to proofread,  errors can occur which can change the documentation meaning.    Virgina Norfolk, DO 05/04/23 1727

## 2023-05-04 NOTE — ED Notes (Signed)
Patient to room 30. Patient ambulated to room with assist of walker. Patient oriented to unit and room.

## 2023-05-04 NOTE — ED Triage Notes (Signed)
EMS from home, called out for left side rib pain, cough, congestion. On arrival told ems she wanted to talk to a psychiatrist and wants to hurt herself on the train tracks.  While this RN was getting report, pt attempted to walk out of department despite redirection by multiple staff. Initially denied SI but when walking back to bed, told this nurse "I wanted to talk to a psychiatrist at the hospital but they sent me home. Some days I want to hurt  myself." This RN asked if today was one of those days, pt replied yes. Pt tearful in triage, also asking for food. Dr Rhae Hammock at bedside.  Pt has her rollator, able to ambulate with steady gait using it.

## 2023-05-05 DIAGNOSIS — F4324 Adjustment disorder with disturbance of conduct: Secondary | ICD-10-CM

## 2023-05-05 MED ORDER — LOPERAMIDE HCL 2 MG PO CAPS
2.0000 mg | ORAL_CAPSULE | ORAL | Status: DC | PRN
  Administered 2023-05-05 (×2): 2 mg via ORAL
  Filled 2023-05-05 (×2): qty 1

## 2023-05-05 MED ORDER — QUETIAPINE FUMARATE 25 MG PO TABS
25.0000 mg | ORAL_TABLET | Freq: Every day | ORAL | Status: DC
  Administered 2023-05-05: 25 mg via ORAL
  Filled 2023-05-05: qty 1

## 2023-05-05 NOTE — ED Notes (Signed)
[facility] provided pt with the address and phone number to the local social security office to change her payee. [facility] also provided resource information for [facility] to access outpatient services and receive assistance with her medication.  Jacquelynn Cree, [facility]  05/05/23

## 2023-05-05 NOTE — ED Notes (Addendum)
Patient became upset when talking with the provider.  Patient continue to state, "I am tired of living".

## 2023-05-05 NOTE — ED Notes (Signed)
Ms Maquita was informed that a Netasha Zumbo called and was asking about her medical information. Ms. Makhaila stated it was ok to tell because she is her aunt. This was wittness by Wanita Chamberlain  NT that verbal permission was obtained.

## 2023-05-05 NOTE — ED Notes (Signed)
Patient became upset and anxious.  Patient states having hallucinations, seeing things not there. Patient given PRN (see MAR ) and reassured.

## 2023-05-05 NOTE — Progress Notes (Signed)
CSW was asked by Psych NP to speak with pt regarding utility assistance. CSW met with pt at bedside along with Psych NP. CSW offered for pt too contact DSS tp inquire about utility assistance and pt stated "they don't have any." CSW informed we are unable to provide utility assistance and encouraged pt to still follow up. Pt replied and stated "I won't need them because I won't be living." CSW notified Psych NP of pt's statement. No further TOC needs at this time.

## 2023-05-05 NOTE — Progress Notes (Signed)
[facility] Psych ED Progress Note  05/05/2023 3:50 PM Kaitlyn Gray  MRN:  [id number]   Subjective:  Kaitlyn Gray is a 47 y.o. female patient admitted with previous hx .Schizoaffective Disorder, Bipolar Type, Depression, Anxiety, PTSD, and Polysubstance Abuse (Cocaine, THC), and Multiple Suicide Attempts.  She also has hx of self harm behavior brought in to the ER for for Psychiatry evaluation due to suicide ideation.  Patient was at [facility] ER 05/01/2023 and was seen and discharged with outpatient Psychiatry resources. This is Patient's 32nd visit to the ER in 6 months)  Patient was seen this morning awake and alert and reported feeling suicidal.  Patient earlier was seen using call Bell to punch on her head.  Patient did this until staff ran into her room and took call bell away from her.  She started using both hands to punch on her head hoping to hurt herself.  Patient then three times threatened [place] train [place] and [place] by the Train.  Patient cannot contract for safety stating she is alone in her apartment without heater and no food.  Patient states she feels hopeless and helpless.  Patient already has resumed her home Medications.  We will fax out records to facilities with available bed.  Principal Problem: Adjustment disorder with disturbance of conduct Diagnosis:  Principal Problem:   Adjustment disorder with disturbance of conduct Active Problems:   Cocaine use disorder, severe, dependence [facility])   ED Assessment Time Calculation: Start Time: 1522 Stop Time: 1548 Total Time in Minutes (Assessment Completion): 26   Past Psychiatric History: see initial   [place] Scale:  Flowsheet Row ED from 05/04/2023 in [facility] Emergency Department at [facility] ED from 05/01/2023 in [facility] Emergency Department at 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) 07/19/2017   Hypertension    Hypertensive emergency 05/31/2022   Ischemic stroke (HCC) 01/01/2020   subacute right middle cerebellar peduncle and pons infarction   Left-sided weakness 01/27/2022   Head CT with remote right occipital infarct which is consistent with old left-sided weakness   MDD (major depressive disorder), recurrent severe, 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   Normocytic anemia 02/09/2020   Opiate use    History of Suboxone Therapy until 05/2021   Polysubstance abuse (HCC) 07/19/2017   Prescription drug misadventures (Seroquel)     Past Surgical History:  Procedure Laterality Date   CESAREAN SECTION     Family History:  Family History  Problem Relation Age of Onset   Hypertension Mother    CAD Mother 60       died of MI at age 66   Hypertension Father    Family Psychiatric  History: see initial evaluation note Social History:  Social History   Substance and Sexual Activity  Alcohol Use Not Currently     Social History   Substance and Sexual Activity  Drug Use Yes   Types: Marijuana, Cocaine   Comment: occassionally    Social History   Socioeconomic History   Marital status: Single    Spouse name: Not on file   Number of children: Not on file   Years of education: Not on file   Highest education level: Not on  file  Occupational History   Not on file  Tobacco Use   Smoking status: Former    Types: Cigarettes    Start date: 06/16/1998    Passive exposure: Current   Smokeless tobacco: Former  Building services engineer status: Every Day  Substance and Sexual Activity   Alcohol use: Not Currently   Drug use: Yes    Types: Marijuana, Cocaine    Comment: occassionally   Sexual activity: Not on file  Other Topics Concern   Not on file  Social History Narrative   ** Merged History Encounter **       Social Determinants of Health   Financial Resource Strain: Not on file  Food Insecurity: Food Insecurity Present (02/09/2023)   Hunger Vital Sign    Worried About Running Out of Food in the Last Year: Often true    Ran Out of Food in the Last Year: Often true  Transportation Needs: Unmet Transportation Needs (02/09/2023)   PRAPARE - Administrator, Civil Service (Medical): Yes    Lack of Transportation (Non-Medical): Yes  Physical Activity: Not on file  Stress: Not on file  Social Connections: Not on file    Sleep: Good  Appetite:  Fair  Current Medications: Current Facility-Administered Medications  Medication Dose Route Frequency Provider Last Rate Last Admin   amLODipine (NORVASC) tablet 10 mg  10 mg Oral Daily Curatolo, Adam, DO   10 mg at 05/05/23 0902   ARIPiprazole (ABILIFY) tablet 10 mg  10 mg Oral QHS Curatolo, Adam, DO   10 mg at 05/04/23 2118   aspirin EC tablet 81 mg  81 mg Oral Daily Curatolo, Adam, DO   81 mg at 05/05/23 0901   FLUoxetine (PROZAC) capsule 10 mg  10 mg Oral Daily Dahlia Byes C, NP   10 mg at 05/05/23 0901   gabapentin (NEURONTIN) capsule 100 mg  100 mg Oral TID Virgina Norfolk, DO   100 mg at 05/05/23 1508   hydrOXYzine (ATARAX) tablet 25 mg  25 mg Oral TID PRN Virgina Norfolk, DO       losartan (COZAAR) tablet 100 mg  100 mg Oral Daily Curatolo, Adam, DO   100 mg at 05/05/23 0902   metFORMIN (GLUCOPHAGE) tablet 500 mg  500 mg Oral BID WC Curatolo,  Adam, DO   500 mg at 05/04/23 1733   pantoprazole (PROTONIX) EC tablet 40 mg  40 mg Oral Daily Curatolo, Adam, DO   40 mg at 05/05/23 0901   QUEtiapine (SEROQUEL) tablet 25 mg  25 mg Oral QHS Kmari Brian C, NP       rosuvastatin (CRESTOR) tablet 40 mg  40 mg Oral Daily Curatolo, Adam, DO   40 mg at 05/05/23 0901   Current Outpatient Medications  Medication Sig Dispense Refill   ARIPiprazole (ABILIFY) 10 MG tablet Take 1 tablet (10 mg total) by mouth at bedtime. 30 tablet 0   aspirin EC 81 MG  tablet Take 81 mg by mouth daily. Swallow whole.     ferrous sulfate 325 (65 FE) MG tablet Take 1 tablet (325 mg total) by mouth 3 (three) times daily with meals. 90 tablet 0   FLUoxetine (PROZAC) 10 MG capsule Take 1 capsule (10 mg total) by mouth daily. 30 capsule 0   gabapentin (NEURONTIN) 100 MG capsule Take 1 capsule (100 mg total) by mouth 3 (three) times daily for pain scale 4-7. 45 capsule 1   hydrOXYzine (ATARAX) 25 MG tablet Take 1 tablet (25 mg total) by mouth 3 (three) times daily as needed for anxiety. 30 tablet 0   losartan (COZAAR) 100 MG tablet Take 1 tablet (100 mg total) by mouth daily. 30 tablet 2   metFORMIN (GLUCOPHAGE) 500 MG tablet Take 1 tablet (500 mg total) by mouth 2 (two) times daily with a meal. 60 tablet 2   nicotine polacrilex (NICORETTE) 2 MG gum Take 1 each (2 mg total) by mouth every 2 (two) hours as needed for Smoking Cravings 100 each 0   PARoxetine (PAXIL) 20 MG tablet Take 1 tablet (20 mg total) by mouth daily. 30 tablet 0   QUEtiapine (SEROQUEL) 100 MG tablet Take 1 tablet (100 mg total) by mouth at bedtime. 30 tablet 1   rosuvastatin (CRESTOR) 40 MG tablet Take 1 tablet (40 mg total) by mouth daily. 30 tablet 2    Lab Results:  Results for orders placed or performed during the hospital encounter of 05/04/23 (from the past 48 hour(s))  Comprehensive metabolic panel     Status: Abnormal   Collection Time: 05/04/23  3:33 PM  Result Value Ref Range   Sodium 141  135 - 145 mmol/L   Potassium 3.5 3.5 - 5.1 mmol/L   Chloride 110 98 - 111 mmol/L   CO2 24 22 - 32 mmol/L   Glucose, Bld 124 (H) 70 - 99 mg/dL    Comment: Glucose reference range applies only to samples taken after fasting for at least 8 hours.   BUN 21 (H) 6 - 20 mg/dL   Creatinine, Ser 0.86 (H) 0.44 - 1.00 mg/dL   Calcium 8.6 (L) 8.9 - 10.3 mg/dL   Total Protein 7.6 6.5 - 8.1 g/dL   Albumin 3.7 3.5 - 5.0 g/dL   AST 17 15 - 41 U/L   ALT 28 0 - 44 U/L   Alkaline Phosphatase 61 38 - 126 U/L   Total Bilirubin 0.3 <1.2 mg/dL   GFR, Estimated >57 >84 mL/min    Comment: (NOTE) Calculated using the CKD-EPI Creatinine Equation (2021)    Anion gap 7 5 - 15    Comment: Performed at [facility], [address] [place], [place] [address]  Ethanol     Status: None   Collection Time: 05/04/23  3:33 PM  Result Value Ref Range   Alcohol, Ethyl (B) <10 <10 mg/dL    Comment: (NOTE) Lowest detectable limit for serum alcohol is 10 mg/dL.  For medical purposes only. Performed at [facility], [address], [place], [place] [address]   CBC with Diff     Status: Abnormal   Collection Time: 05/04/23  3:33 PM  Result Value Ref Range   WBC 4.3 4.0 - 10.5 K/uL   RBC 3.77 (L) 3.87 - 5.11 MIL/uL   Hemoglobin 8.1 (L) 12.0 - 15.0 g/dL    Comment: Reticulocyte Hemoglobin testing may be clinically indicated, consider ordering this additional test [id number]    HCT  28.9 (L) 36.0 - 46.0 %   MCV 76.7 (L) 80.0 - 100.0 fL   MCH 21.5 (L) 26.0 - 34.0 pg   MCHC 28.0 (L) 30.0 - 36.0 g/dL   RDW 16.1 (H) 09.6 - 04.5 %   Platelets 415 (H) 150 - 400 K/uL   nRBC 0.0 0.0 - 0.2 %   Neutrophils Relative % 52 %   Neutro Abs 2.3 1.7 - 7.7 K/uL   Lymphocytes Relative 37 %   Lymphs Abs 1.6 0.7 - 4.0 K/uL   Monocytes Relative 8 %   Monocytes Absolute 0.3 0.1 - 1.0 K/uL   Eosinophils Relative 2 %   Eosinophils Absolute 0.1 0.0 - 0.5 K/uL   Basophils Relative 1 %   Basophils  Absolute 0.0 0.0 - 0.1 K/uL   Immature Granulocytes 0 %   Abs Immature Granulocytes 0.01 0.00 - 0.07 K/uL    Comment: Performed at [facility], [address] [place], [place] [address]  hCG, serum, qualitative     Status: None   Collection Time: 05/04/23  3:33 PM  Result Value Ref Range   Preg, Serum NEGATIVE NEGATIVE    Comment:        THE SENSITIVITY OF THIS METHODOLOGY IS >10 mIU/mL. Performed at [facility], [address] [place], [place] [address]   Urine rapid drug screen (hosp performed)     Status: Abnormal   Collection Time: 05/04/23  9:06 PM  Result Value Ref Range   Opiates NONE DETECTED NONE DETECTED   Cocaine POSITIVE (A) NONE DETECTED   Benzodiazepines NONE DETECTED NONE DETECTED   Amphetamines NONE DETECTED NONE DETECTED   Tetrahydrocannabinol NONE DETECTED NONE DETECTED   Barbiturates NONE DETECTED NONE DETECTED    Comment: (NOTE) DRUG SCREEN FOR MEDICAL PURPOSES ONLY.  IF CONFIRMATION IS NEEDED FOR ANY PURPOSE, NOTIFY LAB WITHIN 5 DAYS.  LOWEST DETECTABLE LIMITS FOR URINE DRUG SCREEN Drug Class                     Cutoff (ng/mL) Amphetamine and metabolites    1000 Barbiturate and metabolites    200 Benzodiazepine                 200 Opiates and metabolites        300 Cocaine and metabolites        300 THC                            50 Performed at [facility], [address] [place], [place] [address]     Blood Alcohol level:  Lab Results  Component Value Date   [facility] <10 05/04/2023   ETH <10 04/18/2023    Physical Findings:  CIWA:    COWS:     Musculoskeletal: Strength & Muscle Tone:  uses Consulting civil engineer & Station:  see above Patient leans:  see above.  Psychiatric Specialty Exam:  Presentation  General Appearance:  Casual; Disheveled  Eye Contact: Good  Speech: Normal Rate; Garbled  Speech Volume: Normal  Handedness: Right   Mood and Affect  Mood: Angry;  Irritable; Labile  Affect: Congruent; Labile; Full Range   Thought Process  Thought Processes: Coherent; Linear  Descriptions of Associations:Intact  Orientation:Partial  Thought Content:Logical; Scattered  History of Schizophrenia/Schizoaffective disorder:Yes  Duration of Psychotic Symptoms:Greater than six months  Hallucinations:Hallucinations: None  Ideas of Reference:None  Suicidal Thoughts:Suicidal Thoughts: Yes, Active SI  Active Intent and/or Plan: With Intent; With Plan; With Means to Carry Out; With Access to Means  Homicidal Thoughts:Homicidal Thoughts: No   Sensorium  Memory: Recent Fair; Remote Fair; Immediate Good  Judgment: Impaired  Insight: Shallow   Executive Functions  Concentration: Poor  Attention Span: Poor  Recall: [employer] of Knowledge: Fair  Language: Fair   Psychomotor Activity  Psychomotor Activity: Psychomotor Activity: Normal   Assets  Assets: Communication Skills; Desire for Improvement; Housing; Physical Health   Sleep  Sleep: Sleep: Fair    Physical Exam: Physical Exam Vitals and nursing note reviewed.  HENT:     Nose: Nose normal.  Cardiovascular:     Rate and Rhythm: Normal rate and regular rhythm.  Pulmonary:     Effort: Pulmonary effort is normal.  Musculoskeletal:     Comments: Vinson Moselle.  S/P CVA X4  Skin:    General: Skin is dry.  Neurological:     Mental Status: She is alert.     Comments: Vinson Moselle.  S/P CVA X4  Psychiatric:        Attention and Perception: Perception normal. She is inattentive.        Mood and Affect: Mood is anxious and depressed. Affect is labile, angry, tearful and inappropriate.        Speech: Speech is rapid and pressured.        Behavior: Behavior is cooperative.        Thought Content: Thought content includes suicidal ideation. Thought content includes suicidal plan.        Judgment: Judgment is impulsive and inappropriate.    Review of  Systems  Constitutional: Negative.   HENT: Negative.    Eyes: Negative.   Respiratory: Negative.    Cardiovascular: Negative.   Gastrointestinal: Negative.   Genitourinary: Negative.   Musculoskeletal:        Vinson Moselle.  S/P CVA X4  Skin: Negative.   Endo/Heme/Allergies: Negative.   Psychiatric/Behavioral:  Positive for substance abuse and suicidal ideas. The patient is nervous/anxious.    Blood pressure (!) 166/90, pulse 70, temperature 97.6 F (36.4 C), temperature source Oral, resp. rate 18, SpO2 100%. There is no height or weight on file to calculate BMI.   Medical Decision Making: Patient meets criteria for inpatient Psychiatry hospitalization.  We will be faxing out records to facilities hoping to get a a bed soon for her.  Patient remains suicidal with [place]  Patient is taking her Abilify 10 mg daily and Seroquel is decreased to 25 mg at bed time for sleep. Admit, seek bed placement.  Earney Navy, NP-PMHNP-BC 05/05/2023, 3:50 PM

## 2023-05-05 NOTE — ED Notes (Signed)
Patient is alert this shift. Patient refused Metformin, states, "do not like how it makes me feel".

## 2023-05-05 NOTE — ED Notes (Signed)
Patient alert, calm, cooperative.  Patient medication compliant.

## 2023-05-05 NOTE — ED Notes (Signed)
Pt reports continued diarrhea stools will give immodium with evening mets.

## 2023-05-05 NOTE — ED Provider Notes (Signed)
Emergency Medicine Observation Re-evaluation Note  Kaitlyn Gray is a 47 y.o. female, seen on rounds today.  Pt initially presented to the ED for complaints of Chest Pain and Suicidal Currently, the patient is resting comfortably.  Physical Exam  BP (!) 166/95 (BP Location: Left Arm)   Pulse 81   Temp 97.6 F (36.4 C) (Oral)   Resp 18   SpO2 99%  Physical Exam General: NAD Lungs: bilateral chest rise, no tachypnea Psych: NAD  ED Course / MDM  EKG:EKG Interpretation Date/Time:  Monday May 04 2023 16:13:55 EST Ventricular Rate:  62 PR Interval:    QRS Duration:  84 QT Interval:  302 QTC Calculation: 306 R Axis:   11  Text Interpretation: Junctional rhythm Nonspecific T wave abnormality Abnormal ECG When compared with ECG of 15-Apr-2023 13:00, PREVIOUS ECG IS PRESENT Confirmed by Virgina Norfolk [phone]) on 05/04/2023 4:32:26 PM  I have reviewed the labs performed to date as well as medications administered while in observation.  Recent changes in the last 24 hours include seen by psych recommend reassess in the morning.  Plan  Current plan is for psych reassessment.    Melene Plan, DO 05/05/23 [phone]

## 2023-05-05 NOTE — ED Notes (Signed)
[facility] provided 3 resources to assist pt with her utility bills and reiterated what the resources are that have been given to her.   Jacquelynn Cree, [facility]  05/05/23

## 2023-05-06 DIAGNOSIS — F4324 Adjustment disorder with disturbance of conduct: Secondary | ICD-10-CM | POA: Diagnosis not present

## 2023-05-06 NOTE — ED Provider Notes (Addendum)
  Physical Exam  BP (!) 160/82   Pulse 60   Temp 98.7 F (37.1 C) (Oral)   Resp 16   SpO2 100%   Physical Exam  Procedures  Procedures  ED Course / MDM    Medical Decision Making Amount and/or Complexity of Data Reviewed Labs: ordered. Radiology: ordered.  Risk OTC drugs. Prescription drug management.   Pending placement.  Notification popped up that she had had loose stool.  C. difficile testing ordered.       Benjiman Core, MD 05/06/23 [phone]  Patient is been seen by psychiatry and cleared for discharge.    Benjiman Core, MD 05/06/23 1011

## 2023-05-06 NOTE — Progress Notes (Signed)
[facility] ED Progress Note  05/06/2023 9:23 AM Kaitlyn Gray  MRN:  [id number]   Subjective:  Kaitlyn Gray, 47 y.o., female patient seen face to face by this provider, consulted with Dr. Lucianne Muss; and chart reviewed on 05/06/23.  On evaluation Kaitlyn Gray is adamant that she no longer feels suicidal and she would like to go home.  Patient said she would like bus tickets to be able to get home and to social security.  Patient does not meet criteria for IVC or for inpatient psychiatric care.  She is psych cleared.   During evaluation Kaitlyn Gray is seated on her bed in no acute distress. She is alert, oriented x 2, calm and cooperative.  Her mood is irritable with congruent affect. She has normal speech, and behavior.  Objectively there is no evidence of psychosis/mania or delusional thinking.  Patient is able to converse coherently, goal directed thoughts, no distractibility, or pre-occupation.  She denies suicidal/self-harm/homicidal ideation, psychosis, and paranoia.  Patient answered questions appropriately.     Principal Problem: Adjustment disorder with disturbance of conduct Diagnosis:  Principal Problem:   Adjustment disorder with disturbance of conduct Active Problems:   Cocaine use disorder, severe, dependence (HCC)   ED Assessment Time Calculation: Start Time: 0700 Stop Time: 0800 Total Time in Minutes (Assessment Completion): 60   Past Psychiatric History: See consult note  [place] Scale:  Flowsheet Row ED from 05/04/2023 in [facility] Emergency Department at [facility] ED from 05/01/2023 in [facility] Emergency Department at [facility] ED from 04/19/2023 in [facility] Emergency Department at [facility]  C-SSRS RISK CATEGORY High Risk Error: Q3, 4, or 5 should not be populated when Q2 is No Low Risk       Past Medical History:  Past Medical History:  Diagnosis Date   Adjustment disorder with disturbance of conduct  02/12/2020   Bipolar 1 disorder (HCC)    Cannabis use disorder, moderate, dependence (HCC) 03/24/2015   Chronic anemia    Cocaine use disorder, severe, dependence (HCC) 03/24/2015   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    Encounter for assessment of healthcare decision-making capacity    History of cervical fracture 12/24/2017   nondisplaced fracture lateral mass of C1 on the right on CT 12/24/17   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) 07/19/2017   Hypertension    Hypertensive emergency 05/31/2022   Ischemic stroke (HCC) 01/01/2020   subacute right middle cerebellar peduncle and pons infarction   Left-sided weakness 01/27/2022   Head CT with remote right occipital infarct which is consistent with old left-sided weakness   MDD (major depressive disorder), recurrent severe, 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   Normocytic anemia 02/09/2020   Opiate use    History of Suboxone Therapy until 05/2021   Polysubstance abuse (HCC) 07/19/2017   Prescription drug misadventures (Seroquel)     Past Surgical History:  Procedure Laterality Date   CESAREAN SECTION     Family History:  Family History  Problem Relation Age of Onset   Hypertension Mother    CAD Mother 63       died of MI at age 36   Hypertension Father    Family Psychiatric  History: See consult note Social History:  Social History   Substance and Sexual Activity  Alcohol Use Not Currently     Social History   Substance and Sexual Activity  Drug Use Yes   Types: Marijuana,  Cocaine   Comment: occassionally    Social History   Socioeconomic History   Marital status: Single    Spouse name: Not on file   Number of children: Not on file   Years of education: Not on file   Highest education level: Not on file  Occupational History   Not on file  Tobacco Use   Smoking status: Former    Types: Cigarettes    Start date: 06/16/1998    Passive exposure: Current   Smokeless tobacco: Former   Building services engineer status: Every Day  Substance and Sexual Activity   Alcohol use: Not Currently   Drug use: Yes    Types: Marijuana, Cocaine    Comment: occassionally   Sexual activity: Not on file  Other Topics Concern   Not on file  Social History Narrative   ** Merged History Encounter **       Social Determinants of Health   Financial Resource Strain: Not on file  Food Insecurity: Food Insecurity Present (02/09/2023)   Hunger Vital Sign    Worried About Running Out of Food in the Last Year: Often true    Ran Out of Food in the Last Year: Often true  Transportation Needs: Unmet Transportation Needs (02/09/2023)   PRAPARE - Administrator, Civil Service (Medical): Yes    Lack of Transportation (Non-Medical): Yes  Physical Activity: Not on file  Stress: Not on file  Social Connections: Not on file    Sleep: Good  Appetite:  Good  Current Medications: Current Facility-Administered Medications  Medication Dose Route Frequency Provider Last Rate Last Admin   amLODipine (NORVASC) tablet 10 mg  10 mg Oral Daily Curatolo, Adam, DO   10 mg at 05/05/23 0902   ARIPiprazole (ABILIFY) tablet 10 mg  10 mg Oral QHS Curatolo, Adam, DO   10 mg at 05/05/23 2225   aspirin EC tablet 81 mg  81 mg Oral Daily Curatolo, Adam, DO   81 mg at 05/05/23 0901   FLUoxetine (PROZAC) capsule 10 mg  10 mg Oral Daily Onuoha, Josephine C, NP   10 mg at 05/05/23 0901   gabapentin (NEURONTIN) capsule 100 mg  100 mg Oral TID Virgina Norfolk, DO   100 mg at 05/05/23 2226   hydrOXYzine (ATARAX) tablet 25 mg  25 mg Oral TID PRN Virgina Norfolk, DO   25 mg at 05/05/23 1730   loperamide (IMODIUM) capsule 2 mg  2 mg Oral PRN Gerhard Munch, MD   2 mg at 05/05/23 2225   losartan (COZAAR) tablet 100 mg  100 mg Oral Daily Curatolo, Adam, DO   100 mg at 05/05/23 0902   metFORMIN (GLUCOPHAGE) tablet 500 mg  500 mg Oral BID WC Curatolo, Adam, DO   500 mg at 05/04/23 1733   pantoprazole (PROTONIX) EC  tablet 40 mg  40 mg Oral Daily Curatolo, Adam, DO   40 mg at 05/05/23 0901   QUEtiapine (SEROQUEL) tablet 25 mg  25 mg Oral QHS Onuoha, Josephine C, NP   25 mg at 05/05/23 2225   rosuvastatin (CRESTOR) tablet 40 mg  40 mg Oral Daily Curatolo, Adam, DO   40 mg at 05/05/23 0901   Current Outpatient Medications  Medication Sig Dispense Refill   ARIPiprazole (ABILIFY) 10 MG tablet Take 1 tablet (10 mg total) by mouth at bedtime. 30 tablet 0   aspirin EC 81 MG tablet Take 81 mg by mouth daily. Swallow whole.  ferrous sulfate 325 (65 FE) MG tablet Take 1 tablet (325 mg total) by mouth 3 (three) times daily with meals. 90 tablet 0   FLUoxetine (PROZAC) 10 MG capsule Take 1 capsule (10 mg total) by mouth daily. 30 capsule 0   gabapentin (NEURONTIN) 100 MG capsule Take 1 capsule (100 mg total) by mouth 3 (three) times daily for pain scale 4-7. 45 capsule 1   hydrOXYzine (ATARAX) 25 MG tablet Take 1 tablet (25 mg total) by mouth 3 (three) times daily as needed for anxiety. 30 tablet 0   losartan (COZAAR) 100 MG tablet Take 1 tablet (100 mg total) by mouth daily. 30 tablet 2   metFORMIN (GLUCOPHAGE) 500 MG tablet Take 1 tablet (500 mg total) by mouth 2 (two) times daily with a meal. 60 tablet 2   nicotine polacrilex (NICORETTE) 2 MG gum Take 1 each (2 mg total) by mouth every 2 (two) hours as needed for Smoking Cravings 100 each 0   PARoxetine (PAXIL) 20 MG tablet Take 1 tablet (20 mg total) by mouth daily. 30 tablet 0   QUEtiapine (SEROQUEL) 100 MG tablet Take 1 tablet (100 mg total) by mouth at bedtime. 30 tablet 1   rosuvastatin (CRESTOR) 40 MG tablet Take 1 tablet (40 mg total) by mouth daily. 30 tablet 2    Lab Results:  Results for orders placed or performed during the hospital encounter of 05/04/23 (from the past 48 hour(s))  Comprehensive metabolic panel     Status: Abnormal   Collection Time: 05/04/23  3:33 PM  Result Value Ref Range   Sodium 141 135 - 145 mmol/L   Potassium 3.5 3.5 - 5.1  mmol/L   Chloride 110 98 - 111 mmol/L   CO2 24 22 - 32 mmol/L   Glucose, Bld 124 (H) 70 - 99 mg/dL    Comment: Glucose reference range applies only to samples taken after fasting for at least 8 hours.   BUN 21 (H) 6 - 20 mg/dL   Creatinine, Ser 2.95 (H) 0.44 - 1.00 mg/dL   Calcium 8.6 (L) 8.9 - 10.3 mg/dL   Total Protein 7.6 6.5 - 8.1 g/dL   Albumin 3.7 3.5 - 5.0 g/dL   AST 17 15 - 41 U/L   ALT 28 0 - 44 U/L   Alkaline Phosphatase 61 38 - 126 U/L   Total Bilirubin 0.3 <1.2 mg/dL   GFR, Estimated >62 >13 mL/min    Comment: (NOTE) Calculated using the CKD-EPI Creatinine Equation (2021)    Anion gap 7 5 - 15    Comment: Performed at [facility], [address], [place], [place] [address]  Ethanol     Status: None   Collection Time: 05/04/23  3:33 PM  Result Value Ref Range   Alcohol, Ethyl (B) <10 <10 mg/dL    Comment: (NOTE) Lowest detectable limit for serum alcohol is 10 mg/dL.  For medical purposes only. Performed at [facility], [address] [place], [place] [address]   CBC with Diff     Status: Abnormal   Collection Time: 05/04/23  3:33 PM  Result Value Ref Range   WBC 4.3 4.0 - 10.5 K/uL   RBC 3.77 (L) 3.87 - 5.11 MIL/uL   Hemoglobin 8.1 (L) 12.0 - 15.0 g/dL    Comment: Reticulocyte Hemoglobin testing may be clinically indicated, consider ordering this additional test [id number]    HCT 28.9 (L) 36.0 - 46.0 %   MCV 76.7 (L) 80.0 -  100.0 fL   MCH 21.5 (L) 26.0 - 34.0 pg   MCHC 28.0 (L) 30.0 - 36.0 g/dL   RDW 60.4 (H) 54.0 - 98.1 %   Platelets 415 (H) 150 - 400 K/uL   nRBC 0.0 0.0 - 0.2 %   Neutrophils Relative % 52 %   Neutro Abs 2.3 1.7 - 7.7 K/uL   Lymphocytes Relative 37 %   Lymphs Abs 1.6 0.7 - 4.0 K/uL   Monocytes Relative 8 %   Monocytes Absolute 0.3 0.1 - 1.0 K/uL   Eosinophils Relative 2 %   Eosinophils Absolute 0.1 0.0 - 0.5 K/uL   Basophils Relative 1 %   Basophils Absolute 0.0 0.0 - 0.1 K/uL   Immature  Granulocytes 0 %   Abs Immature Granulocytes 0.01 0.00 - 0.07 K/uL    Comment: Performed at [facility], [address] [place], [place] [address]  hCG, serum, qualitative     Status: None   Collection Time: 05/04/23  3:33 PM  Result Value Ref Range   Preg, Serum NEGATIVE NEGATIVE    Comment:        THE SENSITIVITY OF THIS METHODOLOGY IS >10 mIU/mL. Performed at [facility], [address] [place], [place] [address]   Urine rapid drug screen (hosp performed)     Status: Abnormal   Collection Time: 05/04/23  9:06 PM  Result Value Ref Range   Opiates NONE DETECTED NONE DETECTED   Cocaine POSITIVE (A) NONE DETECTED   Benzodiazepines NONE DETECTED NONE DETECTED   Amphetamines NONE DETECTED NONE DETECTED   Tetrahydrocannabinol NONE DETECTED NONE DETECTED   Barbiturates NONE DETECTED NONE DETECTED    Comment: (NOTE) DRUG SCREEN FOR MEDICAL PURPOSES ONLY.  IF CONFIRMATION IS NEEDED FOR ANY PURPOSE, NOTIFY LAB WITHIN 5 DAYS.  LOWEST DETECTABLE LIMITS FOR URINE DRUG SCREEN Drug Class                     Cutoff (ng/mL) Amphetamine and metabolites    1000 Barbiturate and metabolites    200 Benzodiazepine                 200 Opiates and metabolites        300 Cocaine and metabolites        300 THC                            50 Performed at [facility], [address], [place], [place] [address]     Blood Alcohol level:  Lab Results  Component Value Date   [facility] <10 05/04/2023   ETH <10 04/18/2023    Physical Findings:  CIWA:    COWS:     Musculoskeletal: Strength & Muscle Tone: within normal limits Gait & Station: unsteady Patient leans: Front  Psychiatric Specialty Exam:  Presentation  General Appearance:  Disheveled  Eye Contact: Good  Speech: Clear and Coherent  Speech Volume: Normal  Handedness: Right   Mood and Affect  Mood: Irritable  Affect: Congruent   Thought Process  Thought  Processes: Coherent  Descriptions of Associations:Intact  Orientation:Partial  Thought Content:Logical  History of Schizophrenia/Schizoaffective disorder:Yes  Duration of Psychotic Symptoms:Greater than six months  Hallucinations:Hallucinations: None  Ideas of Reference:None  Suicidal Thoughts:Suicidal Thoughts: No SI Active Intent and/or Plan: With Intent; With Plan; With Means to Carry Out; With Access to Means  Homicidal Thoughts:Homicidal Thoughts: No   Sensorium  Memory: Immediate Fair; Recent Fair; Remote Fair  Judgment: Impaired  Insight: Poor   Executive Functions  Concentration: Poor  Attention Span: Poor  Recall: [employer] of Knowledge: Fair  Language: Fair   Psychomotor Activity  Psychomotor Activity: Psychomotor Activity: Normal   Assets  Assets: Manufacturing systems engineer; Housing; Leisure Time   Sleep  Sleep: Sleep: Good Number of Hours of Sleep: 8    Physical Exam: Physical Exam Vitals and nursing note reviewed.  Eyes:     Pupils: Pupils are equal, round, and reactive to light.  Pulmonary:     Effort: Pulmonary effort is normal.  Skin:    General: Skin is dry.  Neurological:     Mental Status: She is alert.  Psychiatric:        Attention and Perception: Attention normal.        Mood and Affect: Affect is labile.        Speech: Speech normal.        Behavior: Behavior is agitated.    Review of Systems  All other systems reviewed and are negative.  Blood pressure (!) 160/82, pulse 60, temperature 98.7 F (37.1 C), temperature source Oral, resp. rate 16, SpO2 100%. There is no height or weight on file to calculate BMI.   Medical Decision Making: Patient case reviewed and discussed with Dr Lucianne Muss. Patient denies suicidal ideation. Patient does not meet criteria for IVC or for inpatient psychiatric care.  She is psych cleared.   Problem 1: Adjustment disorder -continue with outpatient provider.  Thomes Lolling,  NP 05/06/2023, 9:23 AM

## 2023-06-25 ENCOUNTER — Emergency Department (HOSPITAL_COMMUNITY): Payer: MEDICAID

## 2023-06-25 ENCOUNTER — Emergency Department (HOSPITAL_COMMUNITY)
Admission: EM | Admit: 2023-06-25 | Discharge: 2023-06-25 | Discharge status: Against Medical Advice | Payer: MEDICAID | Attending: Emergency Medicine | Admitting: Emergency Medicine

## 2023-06-25 ENCOUNTER — Other Ambulatory Visit: Payer: Self-pay

## 2023-06-25 DIAGNOSIS — Z5321 Procedure and treatment not carried out due to patient leaving prior to being seen by health care provider: Secondary | ICD-10-CM | POA: Diagnosis not present

## 2023-06-25 DIAGNOSIS — R531 Weakness: Secondary | ICD-10-CM | POA: Diagnosis present

## 2023-06-25 DIAGNOSIS — Z8673 Personal history of transient ischemic attack (TIA), and cerebral infarction without residual deficits: Secondary | ICD-10-CM | POA: Insufficient documentation

## 2023-06-25 LAB — DIFFERENTIAL
Abs Immature Granulocytes: 0.01 10*3/uL (ref 0.00–0.07)
Basophils Absolute: 0 10*3/uL (ref 0.0–0.1)
Basophils Relative: 1 %
Eosinophils Absolute: 0 10*3/uL (ref 0.0–0.5)
Eosinophils Relative: 0 %
Immature Granulocytes: 0 %
Lymphocytes Relative: 23 %
Lymphs Abs: 1.2 10*3/uL (ref 0.7–4.0)
Monocytes Absolute: 0.2 10*3/uL (ref 0.1–1.0)
Monocytes Relative: 4 %
Neutro Abs: 3.6 10*3/uL (ref 1.7–7.7)
Neutrophils Relative %: 72 %

## 2023-06-25 LAB — CBC
HCT: 29.1 % — ABNORMAL LOW (ref 36.0–46.0)
Hemoglobin: 8.2 g/dL — ABNORMAL LOW (ref 12.0–15.0)
MCH: 20.4 pg — ABNORMAL LOW (ref 26.0–34.0)
MCHC: 28.2 g/dL — ABNORMAL LOW (ref 30.0–36.0)
MCV: 72.6 fL — ABNORMAL LOW (ref 80.0–100.0)
Platelets: 345 10*3/uL (ref 150–400)
RBC: 4.01 MIL/uL (ref 3.87–5.11)
RDW: 19.8 % — ABNORMAL HIGH (ref 11.5–15.5)
WBC: 5.1 10*3/uL (ref 4.0–10.5)
nRBC: 0 % (ref 0.0–0.2)

## 2023-06-25 LAB — COMPREHENSIVE METABOLIC PANEL
ALT: 16 U/L (ref 0–44)
AST: 15 U/L (ref 15–41)
Albumin: 3.6 g/dL (ref 3.5–5.0)
Alkaline Phosphatase: 75 U/L (ref 38–126)
Anion gap: 8 (ref 5–15)
BUN: 14 mg/dL (ref 6–20)
CO2: 22 mmol/L (ref 22–32)
Calcium: 8.7 mg/dL — ABNORMAL LOW (ref 8.9–10.3)
Chloride: 106 mmol/L (ref 98–111)
Creatinine, Ser: 0.81 mg/dL (ref 0.44–1.00)
GFR, Estimated: >60 mL/min (ref >60)
Glucose, Bld: 95 mg/dL (ref 70–99)
Potassium: 3.5 mmol/L (ref 3.5–5.1)
Sodium: 136 mmol/L (ref 135–145)
Total Bilirubin: 0.4 mg/dL (ref 0.0–1.2)
Total Protein: 7.4 g/dL (ref 6.5–8.1)

## 2023-06-25 LAB — I-STAT CHEM 8, ED
BUN: 17 mg/dL (ref 6–20)
Calcium, Ion: 1.11 mmol/L — ABNORMAL LOW (ref 1.15–1.40)
Chloride: 107 mmol/L (ref 98–111)
Creatinine, Ser: 0.9 mg/dL (ref 0.44–1.00)
Glucose, Bld: 93 mg/dL (ref 70–99)
HCT: 29 % — ABNORMAL LOW (ref 36.0–46.0)
Hemoglobin: 9.9 g/dL — ABNORMAL LOW (ref 12.0–15.0)
Potassium: 3.7 mmol/L (ref 3.5–5.1)
Sodium: 140 mmol/L (ref 135–145)
TCO2: 22 mmol/L (ref 22–32)

## 2023-06-25 LAB — PROTIME-INR
INR: 1.1 (ref 0.8–1.2)
Prothrombin Time: 14.2 s (ref 11.4–15.2)

## 2023-06-25 LAB — HCG, SERUM, QUALITATIVE: Preg, Serum: NEGATIVE

## 2023-06-25 LAB — ETHANOL: Alcohol, Ethyl (B): <10 mg/dL (ref <10)

## 2023-06-25 LAB — APTT: aPTT: 29 s (ref 24–36)

## 2023-06-25 NOTE — ED Triage Notes (Signed)
 Patient reports headache and pain at both legs this morning .

## 2023-06-25 NOTE — ED Notes (Signed)
 Pt called x 2 in lobby for room with no response. Moved OTF.

## 2023-06-25 NOTE — ED Notes (Signed)
 Pt refused EKG after multiple attempts despite being educated on the importance of the test. Pt states "I don't want them stickers on me, no, I don't want to take the test."

## 2023-06-25 NOTE — ED Provider Triage Note (Signed)
 Emergency Medicine Provider Triage Evaluation Note  Kaitlyn Gray , a 48 y.o. female  was evaluated in triage.  Pt complains of bilateral lower extremity weakness that she awoke with this morning.  Hx of prior strokes. She is concerned about the same.  She states that she was normal last night when she went to bed.  Review of Systems  Positive: weakness Negative: fever  Physical Exam  BP (!) 158/91 (BP Location: Right Arm)   Pulse 67   Temp 97.6 F (36.4 C)   Resp 18   SpO2 100%  Gen:   Awake, no distress   Resp:  Normal effort  MSK:   Moves extremities without difficulty  Other:  Bilateral lower extremity weakness, might be due to lack of effort  Medical Decision Making  Medically screening exam initiated at 5:51 AM.  Appropriate orders placed.  UNNAMED HINO was informed that the remainder of the evaluation will be completed by another provider, this initial triage assessment does not replace that evaluation, and the importance of remaining in the ED until their evaluation is complete.     Vicky Charleston, PA-C 06/25/23 [phone]

## 2023-07-04 ENCOUNTER — Other Ambulatory Visit: Payer: Self-pay

## 2023-07-04 ENCOUNTER — Emergency Department (HOSPITAL_COMMUNITY): Payer: MEDICAID

## 2023-07-04 ENCOUNTER — Emergency Department (HOSPITAL_COMMUNITY)
Admission: EM | Admit: 2023-07-04 | Discharge: 2023-07-06 | Disposition: A | Discharge status: Home / Self Care | Payer: MEDICAID | Attending: Emergency Medicine | Admitting: Emergency Medicine

## 2023-07-04 ENCOUNTER — Encounter (HOSPITAL_COMMUNITY): Payer: Self-pay | Admitting: Emergency Medicine

## 2023-07-04 DIAGNOSIS — R4781 Slurred speech: Secondary | ICD-10-CM | POA: Diagnosis present

## 2023-07-04 DIAGNOSIS — R4789 Other speech disturbances: Secondary | ICD-10-CM | POA: Diagnosis not present

## 2023-07-04 DIAGNOSIS — F259 Schizoaffective disorder, unspecified: Secondary | ICD-10-CM | POA: Diagnosis not present

## 2023-07-04 DIAGNOSIS — Z91040 Latex allergy status: Secondary | ICD-10-CM | POA: Diagnosis not present

## 2023-07-04 DIAGNOSIS — F250 Schizoaffective disorder, bipolar type: Secondary | ICD-10-CM | POA: Diagnosis present

## 2023-07-04 DIAGNOSIS — Z7982 Long term (current) use of aspirin: Secondary | ICD-10-CM | POA: Insufficient documentation

## 2023-07-04 DIAGNOSIS — Z760 Encounter for issue of repeat prescription: Secondary | ICD-10-CM

## 2023-07-04 LAB — I-STAT CHEM 8, ED
BUN: 15 mg/dL (ref 6–20)
Calcium, Ion: 1.1 mmol/L — ABNORMAL LOW (ref 1.15–1.40)
Chloride: 106 mmol/L (ref 98–111)
Creatinine, Ser: 0.9 mg/dL (ref 0.44–1.00)
Glucose, Bld: 97 mg/dL (ref 70–99)
HCT: 27 % — ABNORMAL LOW (ref 36.0–46.0)
Hemoglobin: 9.2 g/dL — ABNORMAL LOW (ref 12.0–15.0)
Potassium: 3.7 mmol/L (ref 3.5–5.1)
Sodium: 141 mmol/L (ref 135–145)
TCO2: 25 mmol/L (ref 22–32)

## 2023-07-04 LAB — DIFFERENTIAL
Abs Immature Granulocytes: 0.02 10*3/uL (ref 0.00–0.07)
Basophils Absolute: 0 10*3/uL (ref 0.0–0.1)
Basophils Relative: 1 %
Eosinophils Absolute: 0.1 10*3/uL (ref 0.0–0.5)
Eosinophils Relative: 2 %
Immature Granulocytes: 0 %
Lymphocytes Relative: 30 %
Lymphs Abs: 1.5 10*3/uL (ref 0.7–4.0)
Monocytes Absolute: 0.3 10*3/uL (ref 0.1–1.0)
Monocytes Relative: 6 %
Neutro Abs: 3 10*3/uL (ref 1.7–7.7)
Neutrophils Relative %: 61 %

## 2023-07-04 LAB — COMPREHENSIVE METABOLIC PANEL
ALT: 14 U/L (ref 0–44)
AST: 14 U/L — ABNORMAL LOW (ref 15–41)
Albumin: 3 g/dL — ABNORMAL LOW (ref 3.5–5.0)
Alkaline Phosphatase: 59 U/L (ref 38–126)
Anion gap: 10 (ref 5–15)
BUN: 14 mg/dL (ref 6–20)
CO2: 24 mmol/L (ref 22–32)
Calcium: 8.9 mg/dL (ref 8.9–10.3)
Chloride: 104 mmol/L (ref 98–111)
Creatinine, Ser: 0.83 mg/dL (ref 0.44–1.00)
GFR, Estimated: >60 mL/min (ref >60)
Glucose, Bld: 97 mg/dL (ref 70–99)
Potassium: 3.7 mmol/L (ref 3.5–5.1)
Sodium: 138 mmol/L (ref 135–145)
Total Bilirubin: 0.4 mg/dL (ref 0.0–1.2)
Total Protein: 6.5 g/dL (ref 6.5–8.1)

## 2023-07-04 LAB — CBC
HCT: 25.9 % — ABNORMAL LOW (ref 36.0–46.0)
Hemoglobin: 7.4 g/dL — ABNORMAL LOW (ref 12.0–15.0)
MCH: 21 pg — ABNORMAL LOW (ref 26.0–34.0)
MCHC: 28.6 g/dL — ABNORMAL LOW (ref 30.0–36.0)
MCV: 73.4 fL — ABNORMAL LOW (ref 80.0–100.0)
Platelets: 393 10*3/uL (ref 150–400)
RBC: 3.53 MIL/uL — ABNORMAL LOW (ref 3.87–5.11)
RDW: 19.8 % — ABNORMAL HIGH (ref 11.5–15.5)
WBC: 5 10*3/uL (ref 4.0–10.5)
nRBC: 0 % (ref 0.0–0.2)

## 2023-07-04 LAB — PROTIME-INR
INR: 1 (ref 0.8–1.2)
Prothrombin Time: 13.8 s (ref 11.4–15.2)

## 2023-07-04 LAB — APTT: aPTT: 29 s (ref 24–36)

## 2023-07-04 LAB — ETHANOL: Alcohol, Ethyl (B): <10 mg/dL (ref <10)

## 2023-07-04 MED ORDER — GABAPENTIN 100 MG PO CAPS
100.0000 mg | ORAL_CAPSULE | Freq: Three times a day (TID) | ORAL | Status: DC
  Administered 2023-07-04 – 2023-07-06 (×5): 100 mg via ORAL
  Filled 2023-07-04 (×5): qty 1

## 2023-07-04 MED ORDER — QUETIAPINE FUMARATE 25 MG PO TABS
100.0000 mg | ORAL_TABLET | Freq: Every day | ORAL | Status: DC
  Administered 2023-07-04 – 2023-07-05 (×2): 100 mg via ORAL
  Filled 2023-07-04: qty 4
  Filled 2023-07-04: qty 1

## 2023-07-04 MED ORDER — FLUOXETINE HCL 10 MG PO CAPS
10.0000 mg | ORAL_CAPSULE | Freq: Every day | ORAL | Status: DC
  Administered 2023-07-05: 10 mg via ORAL
  Filled 2023-07-04: qty 1

## 2023-07-04 MED ORDER — DIPHENHYDRAMINE HCL 50 MG/ML IJ SOLN
25.0000 mg | Freq: Once | INTRAMUSCULAR | Status: AC
  Administered 2023-07-04: 25 mg via INTRAVENOUS
  Filled 2023-07-04: qty 1

## 2023-07-04 MED ORDER — LOSARTAN POTASSIUM 50 MG PO TABS
100.0000 mg | ORAL_TABLET | Freq: Every day | ORAL | Status: DC
  Administered 2023-07-04 – 2023-07-06 (×3): 100 mg via ORAL
  Filled 2023-07-04 (×3): qty 2

## 2023-07-04 MED ORDER — IOHEXOL 350 MG/ML SOLN
100.0000 mL | Freq: Once | INTRAVENOUS | Status: AC | PRN
  Administered 2023-07-04: 100 mL via INTRAVENOUS

## 2023-07-04 NOTE — ED Notes (Signed)
Attempted to ambulate pt with walker. Pt able to stand without assistance, but was unable to move left leg beyond dragging it. Pt also complained of dizziness. Pt put back into bed without taking any steps

## 2023-07-04 NOTE — ED Provider Notes (Addendum)
Care of patient assumed from Dr. Earlene Plater.  This patient with significant psychiatric history presents for slurred speech and left-sided deficits since last night.  Neuroexams have been inconsistent.  Symptoms likely volitional.  CT imaging has been negative.  MRI is pending.  Neurology recommends no further workup if MRI is negative. Physical Exam  BP (!) 157/66   Pulse 61   Temp 98.9 F (37.2 C)   Resp (!) 22   Wt 75 kg   SpO2 100%   BMI 25.14 kg/m   Physical Exam Vitals and nursing note reviewed.  Constitutional:      General: She is not in acute distress.    Appearance: Normal appearance. She is well-developed. She is not ill-appearing, toxic-appearing or diaphoretic.  HENT:     Head: Normocephalic and atraumatic.     Right Ear: External ear normal.     Nose: Nose normal.  Eyes:     Extraocular Movements: Extraocular movements intact.     Conjunctiva/sclera: Conjunctivae normal.  Cardiovascular:     Rate and Rhythm: Normal rate and regular rhythm.  Pulmonary:     Effort: Pulmonary effort is normal. No respiratory distress.  Abdominal:     General: There is no distension.     Palpations: Abdomen is soft.  Musculoskeletal:        General: No swelling or deformity. Normal range of motion.     Cervical back: Normal range of motion and neck supple.  Skin:    General: Skin is warm and dry.     Capillary Refill: Capillary refill takes less than 2 seconds.  Neurological:     General: No focal deficit present.     Mental Status: She is alert and oriented to person, place, and time.  Psychiatric:        Mood and Affect: Mood normal.        Behavior: Behavior normal.     Procedures  Procedures  ED Course / MDM    Medical Decision Making Risk Prescription drug management.   On assessment, patient resting comfortably on ED stretcher.  She has been eating food without difficulty.  She was informed of negative MRI results.  She states that her speech still does not seem  normal to her.  She has a bit of a twist in her mouth.  She is prescribed multiple antipsychotic medications.  Will trial Benadryl for possible dystonic reaction.  After Benadryl, patient still reported that her speech did not seem right to her.  She is actively moving all extremities.  On trial of ambulation, she was able to stand without assistance.  She refused to walk.  There is likely psychiatric component to this.  Patient reports she has been off of her home medications for the past month.  Will consult TTS and PT.  Patient to board in the ED pending these evaluations.       Gloris Manchester, MD 07/04/23 1610    Gloris Manchester, MD 07/04/23 2025

## 2023-07-04 NOTE — ED Notes (Addendum)
 Kaitlyn Gray

## 2023-07-04 NOTE — ED Provider Notes (Signed)
EMERGENCY DEPARTMENT AT [facility] Provider Note   CSN: [id number] Arrival date & time: 07/04/23  1151     History  Chief Complaint  Patient presents with   Code Stroke    TYFFANI ZIMMERS is a 48 y.o. female.  HPI 48 year old female history of prior stroke, polysubstance use, depression, bipolar 1, schizophrenia presenting for speech difficulty.  Patient presents with code stroke.  Reportedly was well last night but developed some speech difficulties last night that worsened today.  She arrives with some slurred speech.  She reports no headache or focal weakness or numbness.  She states she cannot walk due to generalized weakness.  Endorses slurred speech since last night.  Denies any fevers or chills or chest pain or shortness of breath.     Home Medications Prior to Admission medications   Medication Sig Start Date End Date Taking? Authorizing Provider  ARIPiprazole (ABILIFY) 10 MG tablet Take 1 tablet (10 mg total) by mouth at bedtime. 02/12/23   Lauro Franklin, MD  aspirin EC 81 MG tablet Take 81 mg by mouth daily. Swallow whole.    [provider]  ferrous sulfate 325 (65 FE) MG tablet Take 1 tablet (325 mg total) by mouth 3 (three) times daily with meals. 01/21/23 04/21/23  Cathren Laine, MD  FLUoxetine (PROZAC) 10 MG capsule Take 1 capsule (10 mg total) by mouth daily. 04/19/23   Lenox Ponds, NP  gabapentin (NEURONTIN) 100 MG capsule Take 1 capsule (100 mg total) by mouth 3 (three) times daily for pain scale 4-7. 12/01/22     hydrOXYzine (ATARAX) 25 MG tablet Take 1 tablet (25 mg total) by mouth 3 (three) times daily as needed for anxiety. 02/12/23   Lauro Franklin, MD  losartan (COZAAR) 100 MG tablet Take 1 tablet (100 mg total) by mouth daily. 06/04/22 02/09/23  [place], Alvira Philips, DO  metFORMIN (GLUCOPHAGE) 500 MG tablet Take 1 tablet (500 mg total) by mouth 2 (two) times daily with a meal. 06/04/22 12/24/22  [place], Alvira Philips, DO   nicotine polacrilex (NICORETTE) 2 MG gum Take 1 each (2 mg total) by mouth every 2 (two) hours as needed for Smoking Cravings 02/12/23   Lauro Franklin, MD  PARoxetine (PAXIL) 20 MG tablet Take 1 tablet (20 mg total) by mouth daily. 02/13/23   Lauro Franklin, MD  QUEtiapine (SEROQUEL) 100 MG tablet Take 1 tablet (100 mg total) by mouth at bedtime. 04/19/23   Bethann Berkshire, MD  rosuvastatin (CRESTOR) 40 MG tablet Take 1 tablet (40 mg total) by mouth daily. 06/04/22 12/24/22  [place], Eric J, DO      Allergies    Tomato, Pork-derived products, Hydrocodone, and Latex    Review of Systems   Review of Systems Review of systems completed and notable as per HPI.  ROS otherwise negative.   Physical Exam Updated Vital Signs BP (!) 157/66   Pulse 61   Temp 98.9 F (37.2 C)   Resp (!) 22   Wt 75 kg   SpO2 100%   BMI 25.14 kg/m  Physical Exam Vitals and nursing note reviewed.  Constitutional:      General: She is not in acute distress.    Appearance: She is well-developed.  HENT:     Head: Normocephalic and atraumatic.     Nose: Nose normal.     Mouth/Throat:     Mouth: Mucous membranes are moist.     Pharynx: Oropharynx is clear.  Eyes:     Extraocular Movements: Extraocular movements intact.     Conjunctiva/sclera: Conjunctivae normal.     Pupils: Pupils are equal, round, and reactive to light.  Cardiovascular:     Rate and Rhythm: Normal rate and regular rhythm.     Pulses: Normal pulses.     Heart sounds: Normal heart sounds. No murmur heard. Pulmonary:     Effort: Pulmonary effort is normal. No respiratory distress.     Breath sounds: Normal breath sounds.  Abdominal:     Palpations: Abdomen is soft.     Tenderness: There is no abdominal tenderness. There is no guarding or rebound.  Musculoskeletal:        General: No swelling.     Cervical back: Neck supple.     Right lower leg: No edema.     Left lower leg: No edema.  Skin:    General: Skin is warm and  dry.     Capillary Refill: Capillary refill takes less than 2 seconds.  Neurological:     General: No focal deficit present.     Mental Status: She is alert and oriented to person, place, and time.     Cranial Nerves: No cranial nerve deficit.     Sensory: No sensory deficit.     Motor: No weakness.     Comments: Patient is awake and alert.  Sitting in bed, using her phone with both hands.  She knows she is in the hospital and is appropriately oriented to time, person, situation.  Her speech is slurred.  Cranial nerves are grossly intact although she is resistant to following commands.  She refuses to move her extremities, however she withdraws from pain in all extremities and her sensation is intact.  Her coordination is grossly intact as well.  Psychiatric:        Mood and Affect: Mood normal.     ED Results / Procedures / Treatments   Labs (all labs ordered are listed, but only abnormal results are displayed) Labs Reviewed  CBC - Abnormal; Notable for the following components:      Result Value   RBC 3.53 (*)    Hemoglobin 7.4 (*)    HCT 25.9 (*)    MCV 73.4 (*)    MCH 21.0 (*)    MCHC 28.6 (*)    RDW 19.8 (*)    All other components within normal limits  COMPREHENSIVE METABOLIC PANEL - Abnormal; Notable for the following components:   Albumin 3.0 (*)    AST 14 (*)    All other components within normal limits  ETHANOL  PROTIME-INR  APTT  DIFFERENTIAL  RAPID URINE DRUG SCREEN, HOSP PERFORMED  URINALYSIS, ROUTINE W REFLEX MICROSCOPIC  HCG, SERUM, QUALITATIVE  I-STAT CHEM 8, ED    EKG EKG Interpretation Date/Time:  Saturday July 04 2023 12:27:10 EST Ventricular Rate:  67 PR Interval:  146 QRS Duration:  69 QT Interval:  426 QTC Calculation: 450 R Axis:   58  Text Interpretation: Sinus rhythm Left atrial enlargement Confirmed by Fulton Reek [phone]) on 07/04/2023 12:43:01 PM  Radiology MR BRAIN WO CONTRAST Result Date: 07/04/2023 CLINICAL DATA:  Provided  history: Neuro deficit, acute, stroke suspected. EXAM: MRI HEAD WITHOUT CONTRAST TECHNIQUE: Multiplanar, multiecho pulse sequences of the brain and surrounding structures were obtained without intravenous contrast. COMPARISON:  Non-contrast head CT and CT angiogram head/neck 07/04/2023. Brain MRI 03/27/2023. FINDINGS: Brain: No age-advanced or lobar predominant cerebral atrophy. Mild right cerebellar volume loss, unchanged (and  likely related to chronic right cerebellar infarcts). Small chronic cortical/subcortical infarct within the perirolandic left frontal and parietal lobes. Small chronic cortical/subcortical infarct within the right occipital lobe. Moderately advanced multifocal T2 FLAIR hyperintense signal abnormality elsewhere within the cerebral white matter, nonspecific but compatible with chronic small vessel ischemic disease. Chronic lacunar infarcts within the callosal body. Chronic hemorrhagic lacunar infarcts within the bilateral thalami. Chronic infarcts within the pons and within the right middle cerebellar peduncle/cerebellar hemisphere. There is no acute infarct. No evidence of an intracranial mass. No extra-axial fluid collection. No midline shift. Vascular: Maintained flow voids within the proximal large arterial vessels. Skull and upper cervical spine: No focal recent marrow lesion. Sinuses/Orbits: No mass or acute finding within the imaged orbits. Chronic medially displaced fracture deformity of the right lamina papyracea. Mild mucosal thickening within the right maxillary sinus. Small mucous retention cyst within the left maxillary sinus. Minimal mucosal thickening within the left sphenoid and bilateral ethmoid sinuses. IMPRESSION: 1.  No evidence of an acute intracranial abnormality. 2. Multiple remote infarcts with underlying moderately advanced chronic small vessel ischemic disease, as outlined within the body of the report. 3. Paranasal sinus disease as described. Electronically Signed    By: Jackey Loge D.O.   On: 07/04/2023 15:18   CT ANGIO HEAD NECK W WO CM W PERF (CODE STROKE) Result Date: 07/04/2023 CLINICAL DATA:  Provided history: Neuro deficit, acute, stroke suspected. Aphasia, slurred speech, right-sided weakness. EXAM: CT ANGIOGRAPHY HEAD AND NECK CT PERFUSION BRAIN TECHNIQUE: Multidetector CT imaging of the head and neck was performed using the standard protocol during bolus administration of intravenous contrast. Multiplanar CT image reconstructions and MIPs were obtained to evaluate the vascular anatomy. Carotid stenosis measurements (when applicable) are obtained utilizing NASCET criteria, using the distal internal carotid diameter as the denominator. Multiphase CT imaging of the brain was performed following IV bolus contrast injection. Subsequent parametric perfusion maps were calculated using RAPID software. RADIATION DOSE REDUCTION: This exam was performed according to the departmental dose-optimization program which includes automated exposure control, adjustment of the mA and/or kV according to patient size and/or use of iterative reconstruction technique. CONTRAST:  OMNIPAQUE IOHEXOL 350 MG/ML SOLN COMPARISON:  CT angiogram head/neck 02/23/2023. FINDINGS: CTA NECK FINDINGS Aortic arch: Streak/beam hardening artifact arising from a dense contrast bolus and from prominent left-sided venous reflux of contrast. Artifact partially obscures the proximal innominate artery and obscures significant portions of the proximal left subclavian artery. Within this limitation, no hemodynamically significant innominate or proximal subclavian artery stenosis is identified. Right carotid system: CCA and ICA patent within the neck. 50-60% stenosis of the proximal cervical right ICA, not significant changed from the prior CTA of 02/23/2023. Partially retropharyngeal course of the cervical ICA. Left carotid system: Streak/beam hardening artifact obscures the proximal and mid CCA. The distal  CCA and cervical internal carotid artery are patent without stenosis or significant atherosclerotic disease. Partially retropharyngeal course of the cervical ICA. Vertebral arteries: Codominant and patent within the neck. There is an apparent moderate stenosis within the left vertebral artery proximal V1 segment. This stenosis could potentially be artifactual and related to streak/beam hardening artifact at this level. Skeleton: Cervical spondylosis. Other neck: Streak/beam hardening artifact arising from prominent venous reflux of contrast on the left. Within this limitation, no neck mass or cervical lymphadenopathy is identified. Upper chest: No consolidation within the imaged lung apices. Review of the MIP images confirms the above findings CTA HEAD FINDINGS Anterior circulation: The intracranial internal carotid arteries are patent.  The M1 middle cerebral arteries are patent. No M2 proximal branch occlusion or high-grade proximal stenosis. Unchanged multifocal moderate-to-severe stenoses of the third order branches of the bilateral anterior cerebral arteries. No intracranial aneurysm is identified. Posterior circulation: The intracranial vertebral arteries are patent. The basilar artery is patent. The posterior cerebral arteries are patent. Unchanged multifocal high-grade stenoses within the right posterior cerebral artery, greatest at the P2 segment. As before, there is asymmetrically diminished enhancement of the distal right PCA branches. Posterior communicating arteries are diminutive or absent, bilaterally. Venous sinuses: Within the limitations of contrast timing, no convincing thrombus. Anatomic variants: As described. Review of the MIP images confirms the above findings CT Brain Perfusion Findings: CBF (<30%) Volume: 0mL Perfusion (Tmax>6.0s) volume: 0mL Mismatch Volume: 0mL Infarction Location:None identified. No emergent large vessel occlusion identified. These results were communicated to Dr.  Amada Jupiter at 12:33 pmon 1/18/2025by text page via the [facility] messaging system. IMPRESSION: CTA neck: 1. Streak/beam hardening artifact obscures the proximal and mid left common carotid artery. Within this limitation, the common carotid and internal carotid arteries are patent within the neck. 50-60% stenosis of the proximal cervical right ICA, similar to the prior CTA 02/23/2023. 2. Apparent new moderate stenosis within the left vertebral artery proximal V1 segment. However, this could potentially be artifactual and related to streak/beam hardening artifact. CTA head: 1. No proximal intracranial large vessel occlusion identified. 2. Intracranial atherosclerotic disease with multifocal stenoses, unchanged from the prior CTA head of 02/23/2023 and most notably as follows. 3. Multifocal moderate-to-severe stenoses within the third order branches of the bilateral anterior cerebral arteries. 4. Multifocal severe stenoses within the right posterior cerebral artery, greatest at the P2 segment. CT perfusion head: The perfusion software identifies no core infarct. The perfusion software identifies no critically hypoperfused parenchyma (utilizing the Tmax>6 seconds threshold). No mismatch volume reported. Electronically Signed   By: Jackey Loge D.O.   On: 07/04/2023 12:33   CT HEAD CODE STROKE WO CONTRAST Result Date: 07/04/2023 CLINICAL DATA:  Code stroke. Provided history: Neuro deficit, acute, stroke suspected. Aphasia, slurred speech, right-sided weakness. EXAM: CT HEAD WITHOUT CONTRAST TECHNIQUE: Contiguous axial images were obtained from the base of the skull through the vertex without intravenous contrast. RADIATION DOSE REDUCTION: This exam was performed according to the departmental dose-optimization program which includes automated exposure control, adjustment of the mA and/or kV according to patient size and/or use of iterative reconstruction technique. COMPARISON:  Head CT 06/25/2023. FINDINGS: Brain: No  age-advanced or lobar predominant cerebral atrophy. Chronic cortical/subcortical infarcts again noted within the left frontoparietal and right occipital lobes. Chronic infarcts again noted within the right aspect of the pons, right middle cerebellar peduncle and right cerebellar hemisphere. Background moderate patchy and ill-defined hypoattenuation within the cerebral white matter, nonspecific but compatible with chronic small ischemic disease. There is no acute intracranial hemorrhage. No acute demarcated cortical infarct. No extra-axial fluid collection. No evidence of an intracranial mass. No midline shift. Vascular: No hyperdense vessel.  Atherosclerotic calcifications. Skull: No calvarial fracture or aggressive osseous lesion. Sinuses/Orbits: No mass or acute finding within the imaged orbits. Small mucous retention cyst within the left maxillary sinus. Mild mucosal thickening within the right maxillary sinus. Mild mucosal thickening within the right sphenoid sinus. Small volume frothy secretions within the left sphenoid sinus. Other: Chronic fracture deformity of the right lamina papyracea ASPECTS ([place] Stroke Program Early CT Score) - Ganglionic level infarction (caudate, lentiform nuclei, internal capsule, insula, M1-M3 cortex): 7 - Supraganglionic infarction (M4-M6 cortex): 3 Total score (  0-10 with 10 being normal): 10 (when discounting chronic infarcts). No evidence of an acute intracranial abnormality. These results were communicated to Dr. Amada Jupiter at 12:08 pmon 1/18/2025by text page via the [facility] messaging system. IMPRESSION: 1. No evidence of an acute intracranial abnormality. 2. Parenchymal atrophy, chronic small ischemic disease and chronic infarcts as described. Electronically Signed   By: Jackey Loge D.O.   On: 07/04/2023 12:09    Procedures Procedures    Medications Ordered in ED Medications  iohexol (OMNIPAQUE) 350 MG/ML injection 100 mL (100 mLs Intravenous Contrast Given 07/04/23  1208)    ED Course/ Medical Decision Making/ A&P                                 Medical Decision Making  Medical Decision Making:   JAHAN LEATH is a 48 y.o. female who presented to the ED today with slurred speech as code stroke.  On arrival she dorsal slurred speech since last night.  She is evaluated with neurology at bedside.  On exam her weakness seems to be more volitional, she withdraws from pain in all extremities despite saying that she cannot move any of her extremities.  I suspect this could be psychologic approximately volitional.  CT head and CTA did not show any acute findings.  Will follow-up with neurology recommendations as well.   Patient placed on continuous vitals and telemetry monitoring while in ED which was reviewed periodically.  Reviewed and confirmed nursing documentation for past medical history, family history, social history.  Reassessment and Plan:   Exam remains inconsistent.  She shows that she is able to move all extremities but will not consistently follow commands for strength testing.  CT and CTA did not show any acute findings.  Neurology recommends MRI, if negative would be cleared from a neurologic standpoint.  She does not appear to be decompensated from a psychiatric standpoint at this time.  Handoff was given to Dr. Durwin Nora will plan to follow-up MRI and reassess.   Patient's presentation is most consistent with acute complicated illness / injury requiring diagnostic workup.           Final Clinical Impression(s) / ED Diagnoses Final diagnoses:  Slurred speech    Rx / DC Orders ED Discharge Orders     None         Laurence Spates, MD 07/04/23 [phone]

## 2023-07-04 NOTE — ED Triage Notes (Signed)
Pt bib ems from home as a code stroke. LKW 2200. Pt with hx of CVA with slurred speech and L sided deficits. Today friend noted pt with worsening slurred speech and R sided weakness and numbness. Pt with repetitive speech. Ems reports worsening of speech throughout transport.

## 2023-07-04 NOTE — Consult Note (Addendum)
NEUROLOGY CONSULT NOTE   Date of service: July 04, 2023 Patient Name: Kaitlyn Gray MRN:  [id number] DOB:  1976/01/30 Chief Complaint: "speech changes" Requesting Provider: Laurence Spates, MD  History of Present Illness  Kaitlyn Gray is a 48 y.o. 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   She was last known well last night, then on awakening was having some difficulty with her speech and more difficulty walking than typical.  EMS was called and on arrival, she was evaluated and taken for an emergent CT/CTA.  She was not a candidate for IV TNK due to time of onset, and not a candidate for IV thrombectomy due to no LVO   LKW: 1/17 prior to bed IV Thrombolysis: No, outside of window EVT: No, outside of window  NIHSS components Score: Comment  1a Level of Conscious 0[x]  1[]  2[]  3[]      1b LOC Questions 0[]  1[x]  2[]     Insert October  1c LOC Commands 0[x]  1[]  2[]       2 Best Gaze 0[x]  1[]  2[]       3 Visual 0[x]  1[]  2[]  3[]      4 Facial Palsy 0[x]  1[]  2[]  3[]      5a Motor Arm - left 0[]  1[]  2[]  3[x]  4[]  UN[]    5b Motor Arm - Right 0[]  1[]  2[]  3[x]  4[]  UN[]    6a Motor Leg - Left 0[]  1[]  2[]  3[x]  4[]   UN[]    6b Motor Leg - Right 0[]  1[]  2[]  3[x]  4[]  UN[]    7 Limb Ataxia 0[x]  1[]  2[]  3[]  UN[]     8 Sensory 0[x]  1[]  2[]  UN[]      9 Best Language 0[]  1[x]  2[]  3[]      10 Dysarthria 0[]  1[x]  2[]  UN[]      11 Extinct. 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) 07/19/2017   Hypertension    Hypertensive emergency 05/31/2022   Ischemic stroke (HCC) 01/01/2020   subacute right middle cerebellar peduncle and pons infarction   Left-sided weakness 01/27/2022   Head CT with remote right occipital infarct which is consistent with old left-sided weakness   MDD (major depressive disorder), recurrent severe, 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   Normocytic anemia 02/09/2020   Opiate use    History of Suboxone Therapy until 05/2021   Polysubstance abuse (HCC) 07/19/2017  Prescription drug misadventures (Seroquel)     Past Surgical History:  Procedure Laterality Date   CESAREAN SECTION      Family History: Family History  Problem Relation Age of Onset   Hypertension Mother    CAD Mother 1       died of MI at age 69   Hypertension Father     Social History  reports that she has quit smoking. Her smoking use included cigarettes. She started smoking about 25 years ago. She has been exposed to tobacco smoke. She has quit using smokeless tobacco. She reports that she does not currently use alcohol. She reports current  drug use. Drugs: Marijuana and Cocaine.  Allergies  Allergen Reactions   Tomato Anaphylaxis and Other (See Comments)    Pt reports this as an allergy, but has been eating ketchup and pizza on previous visits w/o any s/s of allergic reaction/anaphylaxis.    Pork-Derived Products Itching   Hydrocodone Itching   Latex Itching and Rash    Medications  No current facility-administered medications for this encounter.  Current Outpatient Medications:    ARIPiprazole (ABILIFY) 10 MG tablet, Take 1 tablet (10 mg total) by mouth at bedtime., Disp: 30 tablet, Rfl: 0   aspirin EC 81 MG tablet, Take 81 mg by mouth daily. Swallow whole., Disp: , Rfl:    ferrous sulfate 325 (65 FE) MG tablet, Take 1 tablet (325 mg total) by mouth 3 (three) times daily with meals., Disp: 90 tablet, Rfl: 0   FLUoxetine (PROZAC) 10 MG capsule, Take 1 capsule (10 mg total) by mouth daily., Disp: 30 capsule, Rfl: 0   gabapentin (NEURONTIN) 100 MG capsule, Take 1 capsule (100 mg total) by mouth 3 (three) times daily for pain scale 4-7., Disp: 45 capsule, Rfl: 1   hydrOXYzine (ATARAX) 25 MG tablet, Take 1 tablet (25 mg total) by mouth 3 (three) times daily as needed for anxiety., Disp: 30 tablet, Rfl: 0   losartan (COZAAR) 100 MG tablet, Take 1 tablet (100 mg total) by mouth daily., Disp: 30 tablet, Rfl: 2   metFORMIN (GLUCOPHAGE) 500 MG tablet, Take 1 tablet (500 mg total) by mouth 2 (two) times daily with a meal., Disp: 60 tablet, Rfl: 2   nicotine polacrilex (NICORETTE) 2 MG gum, Take 1 each (2 mg total) by mouth every 2 (two) hours as needed for Smoking Cravings, Disp: 100 each, Rfl: 0   PARoxetine (PAXIL) 20 MG tablet, Take 1 tablet (20 mg total) by mouth daily., Disp: 30 tablet, Rfl: 0   QUEtiapine (SEROQUEL) 100 MG tablet, Take 1 tablet (100 mg total) by mouth at bedtime., Disp: 30 tablet, Rfl: 1   rosuvastatin (CRESTOR) 40 MG tablet, Take 1 tablet (40 mg total) by mouth daily., Disp: 30 tablet, Rfl: 2  Vitals    Vitals:   07/04/23 1149 07/04/23 1230 07/04/23 1249  BP:  (!) 157/66   Pulse:  61   Resp:  (!) 22   Temp:   98.9 F (37.2 C)  SpO2:  100%   Weight: 75 kg      Body mass index is 25.14 kg/m.  Physical Exam   Constitutional: Appears well-developed and well-nourished.   Neurologic Examination    Neuro: Mental Status: Patient is awake, alert, she answers October to the month.  She correctly tells me her age.  Her speech is very baby like, she repeats the same thing over and over again.  She was apparently unable to say anything besides the same repetitive  phrase, until she was pinched at which point she very clearly and fluent speech said " ouch, stop pinching me, please do not pinch me anymore"  Cranial Nerves: II: Blinks to threat bilaterally. Pupils are equal, round, and reactive to light.   III,IV, VI: EOMI without ptosis or diploplia.  V: Facial sensation is symmetric to temperature VII: She holds the left side of her face slightly less mobile than the right Motor: She has giveaway weakness in all four extremities.  When assessing her right arm strength, it was completely flaccid except when held over her head she avoided her face.  Immediately following this test where she was giving flaccidity, she reached for her phone with her right arm and said "I want to ask you a question about something" Sensory: She reports sensation bilaterally  Cerebellar: Does not perform       Labs/Imaging/Neurodiagnostic studies   CBC:  Recent Labs  Lab 07/23/23 1147 23-Jul-2023 1149  WBC 5.0  --   NEUTROABS 3.0  --   HGB 7.4* 9.2*  HCT 25.9* 27.0*  MCV 73.4*  --   PLT 393  --    Basic Metabolic Panel:  Lab Results  Component Value Date   NA 141 07/23/2023   K 3.7 July 23, 2023   CO2 24 07/23/2023   GLUCOSE 97 07/23/23   BUN 15 July 23, 2023   CREATININE 0.90 July 23, 2023   CALCIUM 8.9 07/23/2023   GFRNONAA >60 July 23, 2023   GFRAA >60 03/14/2020   Lipid Panel:  Lab Results   Component Value Date   LDLCALC 95 12/23/2022   HgbA1c:  Lab Results  Component Value Date   HGBA1C 6.3 (H) 12/23/2022   Urine Drug Screen:     Component Value Date/Time   LABOPIA NONE DETECTED 05/04/2023 2106   COCAINSCRNUR POSITIVE (A) 05/04/2023 2106   LABBENZ NONE DETECTED 05/04/2023 2106   AMPHETMU NONE DETECTED 05/04/2023 2106   THCU NONE DETECTED 05/04/2023 2106   LABBARB NONE DETECTED 05/04/2023 2106    Alcohol Level     Component Value Date/Time   ETH <10 2023/07/23 1147   INR  Lab Results  Component Value Date   INR 1.0 07-23-2023   APTT  Lab Results  Component Value Date   APTT 29 07-23-2023   AED levels: No results found for: "PHENYTOIN", "ZONISAMIDE", "LAMOTRIGINE", "LEVETIRACETA"  CT Head without contrast(Personally reviewed): Old cortically based strokes but nothing acute  CT angio Head and Neck with contrast(Personally reviewed): No LVO ASSESSMENT   Tarissa ROHNDA TRIMARCHI is a 49 y.o. 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  Her presentation today is markedly inconsistent, and many of the features are very suggestive of psychogenic etiology.  That being said, she does have a  history of stroke I do think ruling out acute stroke with embellishment would be prudent.  RECOMMENDATIONS  MRI brain, if negative then no further workup is needed. ______________________________________________________________________    Stormy Card, MD [facility]

## 2023-07-04 NOTE — Code Documentation (Signed)
Stroke Response Nurse Documentation Code Documentation  Kaitlyn Gray is a 48 y.o. female arriving to [facility]  via [facility] on 07-04-2023 with past medical hx of biporal, DM, HTN, CVA. On aspirin 81 mg daily. Code stroke was activated by EMS.   Patient from home where she was LKW last night prior to be and now complaining of some difficulty with speech and more difficulty with walking than normal  Stroke team at the bedside on patient arrival. Labs drawn and patient cleared for CT by Dr. Earlene Plater. Patient to CT with team. NIHSS 15, see documentation for details and code stroke times. Patient with decreased LOC, disoriented, bilateral arm weakness, bilateral leg weakness, Expressive aphasia , and dysarthria  on exam. The following imaging was completed:  CT Head and CTA. Patient is not a candidate for IV Thrombolytic due LKW yesterday prior to bed. Patient is not a candidate for IR due to no LVO on CTA.   Care Plan: MRI,  VS and NIHSS q 2 hours.   Bedside handoff with ED RN Gregary Signs  Stroke Response RN

## 2023-07-04 NOTE — BH Assessment (Signed)
TTS reviewed chart and it is unclear of the purpose of the consult. Brayton Caves, RN reported that she will speak with provider about the reason for an consultation. TTS awaiting response.

## 2023-07-05 DIAGNOSIS — F259 Schizoaffective disorder, unspecified: Secondary | ICD-10-CM

## 2023-07-05 MED ORDER — QUETIAPINE FUMARATE 100 MG PO TABS: 100.0000 mg | ORAL_TABLET | Freq: Every day | ORAL | 0 refills | Status: DC

## 2023-07-05 MED ORDER — GABAPENTIN 100 MG PO CAPS: 100.0000 mg | ORAL_CAPSULE | Freq: Three times a day (TID) | ORAL | 0 refills | Status: DC

## 2023-07-05 NOTE — ED Provider Notes (Signed)
Emergency Medicine Observation Re-evaluation Note  Kaitlyn Gray is a 48 y.o. female, seen on rounds today.  Pt initially presented to the ED for complaints of Code Stroke Currently, the patient is discharged home.   Physical Exam  BP (!) 163/94   Pulse 69   Temp 98.6 F (37 C) (Oral)   Resp (!) 23   Ht 5\' 3"  (1.6 m)   Wt 75 kg   SpO2 100%   BMI 29.29 kg/m  Physical Exam General: Alert no acute distress on the phone with family member Cardiac: Regular Lungs: Clear to auscultation Psych: Calm  ED Course / MDM  EKG:EKG Interpretation Date/Time:  Saturday July 04 2023 12:27:10 EST Ventricular Rate:  67 PR Interval:  146 QRS Duration:  69 QT Interval:  426 QTC Calculation: 450 R Axis:   58  Text Interpretation: Sinus rhythm Left atrial enlargement Confirmed by Fulton Reek [id number]) on 07/04/2023 12:43:01 PM  I have reviewed the labs performed to date as well as medications administered while in observation.  Recent changes in the last 24 hours include cleared for discharge.  Patient has had consultation with neurology and diagnostic workup including MRI to rule out stroke.  She has had psychiatry consultation for schizoaffective disorder and impediments to adequate management of psychiatric disorders.  Patient had PT and OT evaluations, they made recommendation for new rollator.  Plan  Current plan is for discharge home with outpatient follow-ups.    Arby Barrette, MD 07/05/23 [phone]

## 2023-07-05 NOTE — Evaluation (Addendum)
Physical Therapy Evaluation Patient Details Name: Kaitlyn Gray MRN: [id number] DOB: [birth year] Today's Date: 07/05/2023  History of Present Illness  48 y.o. female admitted 07/04/23 with slurred speech and L-side deficits; neuroexams inconsistent. Head CT and MRI negative for acute injury; advanced chronic small vessel ischemic disease. PMH includes CVA (2021, 2022), HTN, DM2, bipolar disorder, adjustment disorder, polysubstance abuse.   Clinical Impression  Pt presents with an overall decrease in functional mobility secondary to above. PTA, pt lives alone, ambulatory with rollator, has h/o multiple falls and significant difficulty caring for self at home without any support available. Today, pt able to ambulate short distance in room with rollator and CGA. Pt demonstrates BUE/BLE weakness, though gross strength testing varies from observed functional strength. Pt expressing concerns regarding d/c home today since she is not moving well, wants to stay another day; tough social situation with multiple ED visits; would certainly benefit from ALF/LTC if an option. Per CM, HH services not an option, but pt unable to transport self to OPPT; spoke with CM/SW about concerns. If to remain admitted, will follow acutely to address established goals.    If plan is discharge home, recommend the following: A little help with bathing/dressing/bathroom;Assistance with cooking/housework;Direct supervision/assist for medications management;Direct supervision/assist for financial management;Assist for transportation;Help with stairs or ramp for entrance   Can travel by private vehicle    Yes - does not have private vehicle, likely physically incapable of getting on bus    Equipment Recommendations Rollator (4 wheels)  Recommendations for Other Services       Functional Status Assessment Patient has had a recent decline in their functional status and demonstrates the ability to make significant improvements  in function in a reasonable and predictable amount of time.     Precautions / Restrictions Precautions Precautions: Fall Restrictions Weight Bearing Restrictions Per Provider Order: No      Mobility  Bed Mobility Overal bed mobility: Needs Assistance Bed Mobility: Supine to Sit, Sit to Supine     Supine to sit: Modified independent (Device/Increase time) Sit to supine: Modified independent (Device/Increase time)   General bed mobility comments: increased time and effort. pt states unable to get legs back into bed, provided sheet to hook under L foot then pt able to lift LLE back into bed this way without assist    Transfers Overall transfer level: Needs assistance Equipment used: Rollator (4 wheels) Transfers: Sit to/from Stand Sit to Stand: Supervision           General transfer comment: sit<>stand from ED stretcher to rollator with supervision, cues to lock rollator brakes    Ambulation/Gait Ambulation/Gait assistance: Contact guard assist Gait Distance (Feet): 8 Feet Assistive device: Rollator (4 wheels) Gait Pattern/deviations: Step-to pattern, Trunk flexed, Shuffle, Decreased dorsiflexion - left Gait velocity: Decreased     General Gait Details: slow, guarded gait with rollator and standby assist for balance/lines; noted reduced foot clearance and step length LLE  Stairs            Wheelchair Mobility     Tilt Bed    Modified Rankin (Stroke Patients Only)       Balance Overall balance assessment: Needs assistance Sitting-balance support: No upper extremity supported, Feet supported Sitting balance-Leahy Scale: Fair     Standing balance support: During functional activity, Single extremity supported, Reliant on assistive device for balance Standing balance-Leahy Scale: Poor  Pertinent Vitals/Pain Pain Assessment Pain Assessment: Faces Faces Pain Scale: Hurts a little bit Pain Location: LLE Pain  Descriptors / Indicators: Discomfort Pain Intervention(s): Monitored during session    Home Living Family/patient expects to be discharged to:: Private residence Living Arrangements: Alone   Type of Home: Apartment Home Access: Stairs to enter Entrance Stairs-Rails: Right Entrance Stairs-Number of Steps: 6   Home Layout: Two level (shower upstairs) Home Equipment: Rollator (4 wheels) Additional Comments: rollator is broken. reports no family or friends available to provide assist    Prior Function Prior Level of Function : Needs assist             Mobility Comments: ambulatory with rollator, sometimes sits on it to use like w/c when legs are weak; h/o falls ADLs Comments: reports difficulty caring for herself and financial insecurity     Extremity/Trunk Assessment   Upper Extremity Assessment Upper Extremity Assessment: RUE deficits/detail;LUE deficits/detail (gross strength testing varies from apparent strength observed with functional mobility; pt able to push up and grip rollator bilaterally, later unable to grasp blankets to pull over herself)    Lower Extremity Assessment Lower Extremity Assessment: RLE deficits/detail;LLE deficits/detail (noted BLE weakness (LLE>RLE), though gross strength testing varies from observed functional strength. pt able to get both BLEs out of bed, then unable to perform seated hip flexion or LAQ. able to take steps bilaterally though dragging L foot at times)       Communication   Communication Communication: Other (comment) (h/o dysarthria from prior CVA)  Cognition Arousal: Alert Behavior During Therapy: WFL for tasks assessed/performed Overall Cognitive Status: No family/caregiver present to determine baseline cognitive functioning                                 General Comments: alert, oriented and following commands appropriately        General Comments General comments (skin integrity, edema, etc.): pt with  tough social situation and no support system. would benefit from new rollator since current one is broken; discussed this, follow up PT and support needs with case manager and social work    Exercises     Assessment/Plan    PT Assessment Patient needs continued PT services  PT Problem List Decreased strength;Decreased activity tolerance;Decreased balance;Decreased mobility;Pain       PT Treatment Interventions DME instruction;Gait training;Stair training;Functional mobility training;Therapeutic activities;Therapeutic exercise;Balance training;Patient/family education;Wheelchair mobility training    PT Goals (Current goals can be found in the Care Plan section)  Acute Rehab PT Goals Patient Stated Goal: walk better PT Goal Formulation: With patient Time For Goal Achievement: 07/19/23 Potential to Achieve Goals: Good    Frequency Min 1X/week     Co-evaluation               AM-PAC PT "6 Clicks" Mobility  Outcome Measure Help needed turning from your back to your side while in a flat bed without using bedrails?: None Help needed moving from lying on your back to sitting on the side of a flat bed without using bedrails?: A Little Help needed moving to and from a bed to a chair (including a wheelchair)?: A Little Help needed standing up from a chair using your arms (e.g., wheelchair or bedside chair)?: A Little Help needed to walk in hospital room?: A Little Help needed climbing 3-5 steps with a railing? : A Lot 6 Click Score: 18    End of Session  Activity Tolerance: Patient tolerated treatment well;Patient limited by fatigue Patient left: in bed;with call bell/phone within reach Nurse Communication: Mobility status PT Visit Diagnosis: Other abnormalities of gait and mobility (R26.89);Muscle weakness (generalized) (M62.81);Repeated falls (R29.6)    Time: 1610-9604 PT Time Calculation (min) (ACUTE ONLY): 18 min   Charges:   PT Evaluation $PT Eval Moderate  Complexity: 1 Mod   PT General Charges $$ ACUTE PT VISIT: 1 Visit       Ina Homes, PT, DPT Acute Rehabilitation Services  Personal: Secure Chat Rehab Office: [phone]  Malachy Chamber 07/05/2023, 12:04 PM

## 2023-07-05 NOTE — Care Management (Addendum)
Transition of Care [facility]) - Emergency Department Mini Assessment   Patient Details  Name: Kaitlyn Gray MRN: [id number] Date of Birth: 02-Nov-1975  Transition of Care [facility]) CM/SW Contact:    Lockie Pares, RN Phone Number: 07/05/2023, 10:14 AM   Clinical Narrative:  Patient presented with stroke symptoms history of prior stroke, Polysubstance depression schizophrenia, work up done.PT evaluation recommended new Rolator, the patients old one is broken. Ordered Rolator from [employer]. No home health have contracats with [employer], if PT needed will need OP services.   Consult also for medication assistance. The patient has insurance therefore is ineligible for MATCH 1200 Visited patient at bedside,  She states she lives alone she is twitching in the lips at times, her speech is clear. Discussed instructions of calling [employer] and arranging transportation to therapies and MD appointment. After I left she received a call and was yelling into the phone.  1530 secure chat with RN Provider CSW for all on the same page understanding. Called [employer] about rollator, they stated they need the order signed  before they deliver it  MD aware previously of need to co-sign ED Mini Assessment:    Barriers to Discharge: ED No Barriers        Interventions which prevented an admission or readmission: DME Provided    Patient Contact and Communications        ,                 Admission diagnosis:  Code Stroke Patient Active Problem List   Diagnosis Date Noted   Schizoaffective disorder (HCC) 02/10/2023   Cocaine use disorder, severe, dependence (HCC) 02/08/2023   Malingering 12/22/2022   Inability to access community resources due to transportation insecurity 12/22/2022   Stroke-like symptoms 08/05/2022   Toxic encephalopathy 08/04/2022   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) 05/01/2022   Iron deficiency anemia 04/07/2022   Fall at home, initial encounter  04/07/2022   Ambulatory dysfunction 04/07/2022   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 04/07/2022   Class 1 obesity 04/06/2022   Tobacco abuse 04/06/2022   Abdominal pain 04/04/2022   History of stroke 03/31/2022   Hemiparesis affecting left side as late effect of cerebrovascular accident (CVA) (HCC) 03/31/2022   Opiate use    Microcytic anemia 03/30/2022   Noncompliance with medications    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   History of cervical fracture 12/24/2017   Type 2 diabetes mellitus (HCC) 12/06/2017   Hypertension associated with diabetes (HCC) 12/06/2017   Bipolar disorder (HCC) 07/19/2017   Polysubstance abuse (HCC) 07/19/2017   Cannabis use disorder, moderate, dependence (HCC) 03/24/2015   Depression 03/24/2015   Cannabis abuse 03/23/2013   PCP:  Default, Provider, MD Pharmacy:   [id number] [place], [place] - [address] AT [facility] [address] [place] [address] Phone: [phone] Fax: [phone]  [id number] - [place], [place] - [address] AT [facility] [address] [facility] [address] [place] [address] Phone: [phone] Fax: [phone]  [facility] [address] [place] [address] Phone: [phone] Fax: [phone]  [facility] [address] [place] [address] Phone: [phone] Fax: [phone]  [facility] - [place], [place] - [address] [place] [address]  Phone: [phone] Fax: [phone]  [facility] [place] [facility] [address] [place] [address] Phone: [phone] Fax: [phone]

## 2023-07-05 NOTE — ED Notes (Addendum)
Pt ambulatory to bathroom with walker with stable gait.

## 2023-07-05 NOTE — Consult Note (Signed)
[facility] Psychiatric Consult Initial  Patient Name: .Kaitlyn Gray  MRN: [id number]  DOB: Oct 08, 1975  Consult Order details:  Orders (From admission, onward)     Start     Ordered   07/04/23 2027  CONSULT TO CALL [facility] TEAM       Ordering Provider: Gloris Manchester, MD  Provider:  (Not yet assigned)  Question:  Reason for Consult?  Answer:  Bipolar disorder. Off of medications x 1 month   07/04/23 2027             Mode of Visit: In person    Psychiatry Consult Evaluation  Service Date: July 05, 2023 LOS:  LOS: 0 days  Chief Complaint restart medications  Primary Psychiatric Diagnoses  Schizoaffective disorder 2.  Encounter for medication refill 3.    Assessment  Kaitlyn Gray is a 48 y.o. female admitted: Presented to the [facility] 07/04/2023 11:51 AM for medication refill and evaluation. She carries the psychiatric diagnoses of schizoaffective disorder.    On initial examination, patient pleasant, cooperative, denies SI/HI/AVH and requesting to restart psychiatric medications. Please see plan below for detailed recommendations.   Diagnoses:  Active Hospital problems: Principal Problem:   Schizoaffective disorder (HCC)    Plan   ## Psychiatric Medication Recommendations:  Continue Seroquel 100 mg at bedtime Continue Gabapentin 100 mg TID  ## Medical Decision Making Capacity: Not specifically addressed in this encounter   ## Disposition:-- There are no psychiatric contraindications to discharge at this time  ## Behavioral / Environmental: - No specific recommendations at this time.     ## Safety and Observation Level:  - Based on my clinical evaluation, I estimate the patient to be at low risk of self harm in the current setting. - At this time, we recommend  routine. This decision is based on my review of the chart including patient's history and current presentation, interview of the patient, mental status examination, and consideration of suicide risk  including evaluating suicidal ideation, plan, intent, suicidal or self-harm behaviors, risk factors, and protective factors. This judgment is based on our ability to directly address suicide risk, implement suicide prevention strategies, and develop a safety plan while the patient is in the clinical setting. Please contact our team if there is a concern that risk level has changed.  CSSR Risk Category:C-SSRS RISK CATEGORY: No Risk  Suicide Risk Assessment: Patient has following modifiable risk factors for suicide: medication noncompliance, which we are addressing by medication refill. Patient has following non-modifiable or demographic risk factors for suicide: psychiatric hospitalization Patient has the following protective factors against suicide: Access to outpatient mental health care  Thank you for this consult request. Recommendations have been communicated to the primary team.  We will psych clear for d/c at this time.   Eligha Bridegroom, NP       History of Present Illness  Relevant Aspects of Hospital ED Course:  48 year old female history of prior stroke, polysubstance use, depression, bipolar 1, schizophrenia presenting for speech difficulty. Patient presents with code stroke. Reportedly was well last night but developed some speech difficulties last night that worsened today. She arrives with some slurred speech. She reports no headache or focal weakness or numbness. She states she cannot walk due to generalized weakness. Endorses slurred speech since last night. Denies any fevers or chills or chest pain or shortness of breath.   Pt also expressed she has been noncompliant with psychiatric medications and wanting refill. TTS consulted.   Patient Report:  [facility] for psychiatric assessment.  Patient is pleasant and cooperative.  Patient states she has had some transportation barriers which has caused noncompliance to treatment.  Patient stated she has been off  her medications for around 2 weeks.  Patient was taking Seroquel 100 mg nightly and gabapentin 100 mg 3 times daily.  Patient has had some issues with mobility recently does not have a car.  Patient also reports financial limitations that have been unable for her to get bus passes.  I spoke with case management who states the patient has [employer], and they will give her instructions to contact [facility] tomorrow to get a pharmacy to send her medications to her home.  Due to it being Sunday, we are unable to provide her a 7 day prescription while here in the ED.  She expresses understanding.  Patient denies SI.  She does report feeling lonely at times.  She denies any access to weapons or firearms.  Denies HI.  Denies AVH.  Patient is able to contract for safety and agreeable discharge today.  Will send patient's medications to preferred pharmacy and case management is providing patient with multiple bus passes to help ensure she can pick up her medications tomorrow.   Review of Systems  All other systems reviewed and are negative.    Psychiatric and Social History  Psychiatric History:  Information collected from patient  Prev Dx/Sx: schizoaffective disorder Current Psych Provider: [facility] Home Meds (current): restarted Previous Med Trials: yes   Social History:  Developmental Hx: wdl Educational Hx: high school Occupational Hx: disability Legal Hx: none Living Situation: alone Spiritual Hx: yes Access to weapons/lethal means: denies   Substance History Alcohol: denies  Illicit drugs: denies Prescription drug abuse: denies Rehab hx: denies  Exam Findings  Physical Exam:  Vital Signs:  Temp:  [98.6 F (37 C)-99.7 F (37.6 C)] 98.6 F (37 C) (01/19 0509) Pulse Rate:  [61-78] 69 (01/19 0400) Resp:  [16-23] 23 (01/19 0400) BP: (148-172)/(64-100) 163/94 (01/19 0400) SpO2:  [99 %-100 %] 100 % (01/19 0400) Blood pressure (!) 163/94, pulse 69, temperature 98.6 F (37 C),  temperature source Oral, resp. rate (!) 23, height 5\' 3"  (1.6 m), weight 75 kg, SpO2 100%. Body mass index is 29.29 kg/m.  Physical Exam Vitals and nursing note reviewed.  Neurological:     Mental Status: She is alert and oriented to person, place, and time.     Mental Status Exam: General Appearance: Fairly Groomed  Orientation:  Full (Time, Place, and Person)  Memory:  Immediate;   Fair Recent;   Fair  Concentration:  Concentration: Fair  Recall:  Fair  Attention  Fair  Eye Contact:  Good  Speech:  Normal Rate  Language:  Fair  Volume:  Normal  Mood: good  Affect:  Congruent  Thought Process:  Coherent and Goal Directed  Thought Content:  WDL  Suicidal Thoughts:  No  Homicidal Thoughts:  No  Judgement:  Fair  Insight:  Fair  Psychomotor Activity:  Normal  Akathisia:  No  Fund of Knowledge:  Fair      Assets:  Manufacturing systems engineer Desire for Improvement Housing Physical Health Resilience Social Support  Cognition:  WNL  ADL's:  Intact  AIMS (if indicated):        Other History   These have been pulled in through the EMR, reviewed, and updated if appropriate.  Family History:  The patient's family history includes CAD (age of onset: 70)  in her mother; Hypertension in her father and mother.  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) 07/19/2017   Hypertension    Hypertensive emergency 05/31/2022   Ischemic stroke (HCC) 01/01/2020    subacute right middle cerebellar peduncle and pons infarction   Left-sided weakness 01/27/2022   Head CT with remote right occipital infarct which is consistent with old left-sided weakness   MDD (major depressive disorder), recurrent severe, 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   Normocytic anemia 02/09/2020   Opiate use    History of Suboxone Therapy until 05/2021   Polysubstance abuse (HCC) 07/19/2017   Prescription drug misadventures (Seroquel)     Surgical History: Past Surgical History:  Procedure Laterality Date   CESAREAN SECTION       Medications:   Current Facility-Administered Medications:    gabapentin (NEURONTIN) capsule 100 mg, 100 mg, Oral, TID, Gloris Manchester, MD, 100 mg at 07/05/23 0936   losartan (COZAAR) tablet 100 mg, 100 mg, Oral, Daily, Gloris Manchester, MD, 100 mg at 07/05/23 0936   QUEtiapine (SEROQUEL) tablet 100 mg, 100 mg, Oral, QHS, Dixon, Alycia Rossetti, MD, 100 mg at 07/04/23 2106  Current Outpatient Medications:    aspirin EC 81 MG tablet, Take 81 mg by mouth daily. Swallow whole. (Patient not taking: Reported on 07/04/2023), Disp: , Rfl:    ferrous sulfate 325 (65 FE) MG tablet, Take 1 tablet (325 mg total) by mouth 3 (three) times daily with meals. (Patient not taking: Reported on 07/04/2023), Disp: 90 tablet, Rfl: 0   gabapentin (NEURONTIN) 100 MG capsule, Take 1 capsule (100 mg total) by mouth 3 (three) times daily for pain scale 4-7., Disp: 21 capsule, Rfl: 0   hydrOXYzine (ATARAX) 25 MG tablet, Take 1 tablet (25 mg total) by mouth 3 (three) times daily as needed for anxiety. (Patient not taking: Reported on 07/04/2023), Disp: 30 tablet, Rfl: 0   losartan (COZAAR) 100 MG tablet, Take 1 tablet (100 mg total) by mouth daily. (Patient not taking: Reported on 07/04/2023), Disp: 30 tablet, Rfl: 2   metFORMIN (GLUCOPHAGE) 500 MG tablet, Take 1 tablet (500 mg total) by mouth 2 (two) times daily with a meal. (Patient not taking: Reported on 07/04/2023), Disp: 60 tablet, Rfl: 2    nicotine polacrilex (NICORETTE) 2 MG gum, Take 1 each (2 mg total) by mouth every 2 (two) hours as needed for Smoking Cravings (Patient not taking: Reported on 07/04/2023), Disp: 100 each, Rfl: 0   QUEtiapine (SEROQUEL) 100 MG tablet, Take 1 tablet (100 mg total) by mouth at bedtime for 7 days., Disp: 7 tablet, Rfl: 0   rosuvastatin (CRESTOR) 40 MG tablet, Take 1 tablet (40 mg total) by mouth daily. (Patient not taking: Reported on 07/04/2023), Disp: 30 tablet, Rfl: 2  Allergies: Allergies  Allergen Reactions   Tomato Anaphylaxis and Other (See Comments)    Pt reports this as an allergy, but has been eating ketchup and pizza on previous visits w/o any s/s of allergic reaction/anaphylaxis.  Pork-Derived Products Itching   Hydrocodone Itching   Latex Itching and Rash    Eligha Bridegroom, NP

## 2023-07-05 NOTE — Discharge Instructions (Signed)
 Behavioral Health Resources:  Intensive Outpatient Programs: [facility]      [address] [place], [place] [phone] Both a day and evening program       [facility] Outpatient     [address]        [place], [place] [address] [phone]         ADS: Alcohol & Drug Svcs [address] [place] [phone]  [facility] ACCESS LINE: [phone] or [phone] [address] [place], [place] [address] [email or web address]   Substance Abuse Resources: Alcohol and Drug Services  [phone] [facility] [phone] [facility] [phone] Floydene Flock [phone] Residential & Outpatient Substance Abuse Program  [phone]  Psychological Services: [facility]  [phone] [facility]  [phone] [facility], [phone] [place]. [address], [place], ACCESS LINE: [phone] or [phone], [email or web address]  Mobile Crisis Teams:                                        Therapeutic Alternatives         Mobile Crisis Care Unit [phone]             [facility] [address] [place] [phone]                                         Interventionist [address] [place] [phone]  Self-Help/Support Groups: Mental Health Assoc. of [employer] of support groups [phone] (call for more info)  Narcotics Anonymous (NA) Caring Services [address] [place] - 2 meetings at this location  Residential Treatment Programs:  [facility]      [address]        [place]       [phone]         [facility] [address] [place] [address] [place], [place]  [address] [phone]  [facility]  [address] [place], [place]  [address] [phone] Admissions: 8am-3pm M-F  [facility]     [address]        [place], [place] [address]       [phone]         [facility] [address] [place], [place] [phone]  [facility] [address] [place], [place] [phone]  [facility] - Intensive Outpatient      [address]     [place], [place]       [phone]         [facility] ([facility]     [address] [place], [place] [phone] or [phone]  Residential Treatment Services (RTS), Medicaid [address] [place], [place] [phone]  [facility] [address]                                               [address] [place] [phone]  [facility] Resources: Estate manager/land agent [phone]               General Therapy  Angie Fava, PhD        [address] [place], [place] [address]         [phone]   Insurance  [facility]   [address] [place], [place] [address] [phone]  [facility] [address] [place], [place] [address] [phone] Insurance/Medicaid/sponsorship through [facility] and Families                                              [address]                                        [place], [place] [address]    Therapy/tele-psych/case         [phone]          [facility] [address], [place]  [address]  Adolescent/group home/case management [phone]                                           Creola Corn PhD       General therapy       Insurance   [phone]         Dr. Lolly Mustache, Insurance, M-F [phone]  [facility]  [employer] [facility] Dept. [address]                 [address]         [address] [place] [address]  Blondell Reveal Phone:  [phone]                                  Phone:  [phone]                   Phone:  [phone]  [facility], [phone] [facility]- [phone]       -     [facility] in [place], [address],             [phone], Insurance

## 2023-07-06 ENCOUNTER — Other Ambulatory Visit (HOSPITAL_COMMUNITY): Payer: Self-pay

## 2023-07-06 LAB — CBG MONITORING, ED: Glucose-Capillary: 91 mg/dL (ref 70–99)

## 2023-07-06 MED ORDER — QUETIAPINE FUMARATE 100 MG PO TABS
100.0000 mg | ORAL_TABLET | Freq: Every day | ORAL | 0 refills | Status: DC
  Filled 2023-07-06: qty 7, 7d supply, fill #0

## 2023-07-06 MED ORDER — GABAPENTIN 100 MG PO CAPS
100.0000 mg | ORAL_CAPSULE | Freq: Three times a day (TID) | ORAL | 0 refills | Status: DC
  Filled 2023-07-06: qty 21, 7d supply, fill #0

## 2023-07-06 MED ORDER — LOSARTAN POTASSIUM 100 MG PO TABS
100.0000 mg | ORAL_TABLET | Freq: Every day | ORAL | 0 refills | Status: DC
  Filled 2023-07-06: qty 7, 7d supply, fill #0

## 2023-07-06 NOTE — ED Notes (Signed)
Patient ambulatory to restroom with Rolator, without difficulty.

## 2023-07-06 NOTE — ED Notes (Signed)
Pt given taxi voucher and advised this would be the last one she would receive from facility.

## 2023-07-06 NOTE — ED Notes (Signed)
Pt sent with her medications from TOC.

## 2023-07-06 NOTE — ED Provider Notes (Signed)
Emergency Medicine Observation Re-evaluation Note  Kaitlyn Gray is a 48 y.o. female, seen on rounds today.  Pt initially presented to the ED for complaints of Code Stroke Currently, the patient is awake eating breakfast in bed, no new complaints. Requesting refills of her home medications prior to discharge.  Physical Exam  BP (!) 161/90   Pulse 66   Temp 98.9 F (37.2 C) (Oral)   Resp 15   Ht 5\' 3"  (1.6 m)   Wt 75 kg   SpO2 100%   BMI 29.29 kg/m  Physical Exam General: Awake and alert, no acute distress Cardiac: Regular rate Lungs: No increased WOB Psych: Calm, cooperative  ED Course / MDM  EKG:EKG Interpretation Date/Time:  Saturday July 04 2023 12:27:10 EST Ventricular Rate:  67 PR Interval:  146 QRS Duration:  69 QT Interval:  426 QTC Calculation: 450 R Axis:   58  Text Interpretation: Sinus rhythm Left atrial enlargement Confirmed by Fulton Reek [phone]) on 07/04/2023 12:43:01 PM  I have reviewed the labs performed to date as well as medications administered while in observation.  Recent changes in the last 24 hours include medically and psychiatrically cleared for discharged. Received new rollator walker.  Plan  Current plan is for likely discharge today.    Rexford Maus, [place] 07/06/23 [phone]

## 2023-07-06 NOTE — Progress Notes (Signed)
CSW received consult for patient for a 7 day supply of medications.  CSW spoke with MD to request prescriptions be sent to [facility] to be filled prior to discharge.  Edwin Dada, MSW, LCSW Transitions of Care  Clinical Social Worker II [phone]

## 2023-07-28 ENCOUNTER — Other Ambulatory Visit (HOSPITAL_COMMUNITY): Payer: Self-pay

## 2023-07-30 ENCOUNTER — Encounter: Payer: Self-pay | Admitting: *Deleted

## 2023-07-30 NOTE — Progress Notes (Addendum)
 Pt attended 01/10/2023 screening event where her bp was 162/90 and her blood sugar was 108. At the event, the pt identified all SDOH insecurities and was given resources at the event. She did not note a smoking status and did document having insurance and noted PCP was "[place]." However, during initial f/u, health equity team member unable to contact pt for f/u and reached out to [facility] RN CM (where pt was seen by Dr. Georganna Skeans to establish care on 07/17/2022 )Consequently pt had BH admission and [facility] RN CM noted that [facility] had not been able to contact pt. Chart review indicates that since initial event,pt seen approx 13 times in the ED, In-basket sent to RN CM for [facility] where pt has appt on 09/15/23 with Dr. Georganna Skeans. Since no phone number shared by pt for f/u, final letter sent to pt reminding her of appt with Dr. Andrey Campanile, trying to contact her [employer] CM and flyers with [place] resources also mailed to pt. In-basket also sent to [facility] RN CM to note upcoming 09/15/23 appt in case she is able to meet pt at the office. No additional health equity team support scheduled at this time. Letter returned 09/18/2023, 4:23 P Dorie Rank, RN

## 2023-08-05 ENCOUNTER — Emergency Department (HOSPITAL_COMMUNITY)
Admission: EM | Admit: 2023-08-05 | Discharge: 2023-08-05 | Disposition: A | Discharge status: Home / Self Care | Payer: MEDICAID | Attending: Emergency Medicine | Admitting: Emergency Medicine

## 2023-08-05 ENCOUNTER — Other Ambulatory Visit: Payer: Self-pay

## 2023-08-05 ENCOUNTER — Encounter (HOSPITAL_COMMUNITY): Payer: Self-pay | Admitting: Emergency Medicine

## 2023-08-05 DIAGNOSIS — E119 Type 2 diabetes mellitus without complications: Secondary | ICD-10-CM | POA: Diagnosis not present

## 2023-08-05 DIAGNOSIS — I10 Essential (primary) hypertension: Secondary | ICD-10-CM | POA: Diagnosis not present

## 2023-08-05 DIAGNOSIS — Z79899 Other long term (current) drug therapy: Secondary | ICD-10-CM | POA: Insufficient documentation

## 2023-08-05 DIAGNOSIS — Z91199 Patient's noncompliance with other medical treatment and regimen due to unspecified reason: Secondary | ICD-10-CM | POA: Diagnosis not present

## 2023-08-05 DIAGNOSIS — Z91040 Latex allergy status: Secondary | ICD-10-CM | POA: Insufficient documentation

## 2023-08-05 DIAGNOSIS — D649 Anemia, unspecified: Secondary | ICD-10-CM | POA: Insufficient documentation

## 2023-08-05 DIAGNOSIS — Z8673 Personal history of transient ischemic attack (TIA), and cerebral infarction without residual deficits: Secondary | ICD-10-CM | POA: Insufficient documentation

## 2023-08-05 DIAGNOSIS — R112 Nausea with vomiting, unspecified: Secondary | ICD-10-CM | POA: Insufficient documentation

## 2023-08-05 DIAGNOSIS — Z7984 Long term (current) use of oral hypoglycemic drugs: Secondary | ICD-10-CM | POA: Insufficient documentation

## 2023-08-05 DIAGNOSIS — F1410 Cocaine abuse, uncomplicated: Secondary | ICD-10-CM | POA: Insufficient documentation

## 2023-08-05 DIAGNOSIS — R1084 Generalized abdominal pain: Secondary | ICD-10-CM | POA: Diagnosis present

## 2023-08-05 DIAGNOSIS — Z7982 Long term (current) use of aspirin: Secondary | ICD-10-CM | POA: Insufficient documentation

## 2023-08-05 DIAGNOSIS — Z91148 Patient's other noncompliance with medication regimen for other reason: Secondary | ICD-10-CM

## 2023-08-05 LAB — COMPREHENSIVE METABOLIC PANEL
ALT: 15 U/L (ref 0–44)
AST: 15 U/L (ref 15–41)
Albumin: 3.3 g/dL — ABNORMAL LOW (ref 3.5–5.0)
Alkaline Phosphatase: 54 U/L (ref 38–126)
Anion gap: 9 (ref 5–15)
BUN: 10 mg/dL (ref 6–20)
CO2: 27 mmol/L (ref 22–32)
Calcium: 9.3 mg/dL (ref 8.9–10.3)
Chloride: 106 mmol/L (ref 98–111)
Creatinine, Ser: 0.82 mg/dL (ref 0.44–1.00)
GFR, Estimated: >60 mL/min (ref >60)
Glucose, Bld: 103 mg/dL — ABNORMAL HIGH (ref 70–99)
Potassium: 3.8 mmol/L (ref 3.5–5.1)
Sodium: 142 mmol/L (ref 135–145)
Total Bilirubin: 0.4 mg/dL (ref 0.0–1.2)
Total Protein: 6.9 g/dL (ref 6.5–8.1)

## 2023-08-05 LAB — CBC WITH DIFFERENTIAL/PLATELET
Abs Immature Granulocytes: 0.02 10*3/uL (ref 0.00–0.07)
Basophils Absolute: 0 10*3/uL (ref 0.0–0.1)
Basophils Relative: 1 %
Eosinophils Absolute: 0.1 10*3/uL (ref 0.0–0.5)
Eosinophils Relative: 3 %
HCT: 28.3 % — ABNORMAL LOW (ref 36.0–46.0)
Hemoglobin: 8.1 g/dL — ABNORMAL LOW (ref 12.0–15.0)
Immature Granulocytes: 1 %
Lymphocytes Relative: 37 %
Lymphs Abs: 1.5 10*3/uL (ref 0.7–4.0)
MCH: 20.7 pg — ABNORMAL LOW (ref 26.0–34.0)
MCHC: 28.6 g/dL — ABNORMAL LOW (ref 30.0–36.0)
MCV: 72.2 fL — ABNORMAL LOW (ref 80.0–100.0)
Monocytes Absolute: 0.3 10*3/uL (ref 0.1–1.0)
Monocytes Relative: 7 %
Neutro Abs: 2.1 10*3/uL (ref 1.7–7.7)
Neutrophils Relative %: 51 %
Platelets: 281 10*3/uL (ref 150–400)
RBC: 3.92 MIL/uL (ref 3.87–5.11)
RDW: 19.2 % — ABNORMAL HIGH (ref 11.5–15.5)
WBC: 4.1 10*3/uL (ref 4.0–10.5)
nRBC: 0 % (ref 0.0–0.2)

## 2023-08-05 LAB — RESP PANEL BY RT-PCR (RSV, FLU A&B, COVID)  RVPGX2
Influenza A by PCR: NEGATIVE
Influenza B by PCR: NEGATIVE
Resp Syncytial Virus by PCR: NEGATIVE
SARS Coronavirus 2 by RT PCR: NEGATIVE

## 2023-08-05 LAB — URINALYSIS, ROUTINE W REFLEX MICROSCOPIC
Bilirubin Urine: NEGATIVE
Glucose, UA: NEGATIVE mg/dL
Hgb urine dipstick: NEGATIVE
Ketones, ur: NEGATIVE mg/dL
Leukocytes,Ua: NEGATIVE
Nitrite: NEGATIVE
Protein, ur: NEGATIVE mg/dL
Specific Gravity, Urine: 1.012 (ref 1.005–1.030)
pH: 8 (ref 5.0–8.0)

## 2023-08-05 LAB — HCG, SERUM, QUALITATIVE: Preg, Serum: NEGATIVE

## 2023-08-05 LAB — LIPASE, BLOOD: Lipase: 35 U/L (ref 11–51)

## 2023-08-05 MED ORDER — MORPHINE SULFATE (PF) 4 MG/ML IV SOLN
4.0000 mg | Freq: Once | INTRAVENOUS | Status: AC
  Administered 2023-08-05: 4 mg via INTRAVENOUS
  Filled 2023-08-05: qty 1

## 2023-08-05 MED ORDER — ONDANSETRON HCL 4 MG/2ML IJ SOLN
4.0000 mg | Freq: Once | INTRAMUSCULAR | Status: AC
  Administered 2023-08-05: 4 mg via INTRAVENOUS
  Filled 2023-08-05: qty 2

## 2023-08-05 MED ORDER — SODIUM CHLORIDE 0.9 % IV BOLUS
1000.0000 mL | Freq: Once | INTRAVENOUS | Status: AC
  Administered 2023-08-05: 1000 mL via INTRAVENOUS

## 2023-08-05 MED ORDER — ONDANSETRON 4 MG PO TBDP: 4.0000 mg | ORAL_TABLET | Freq: Three times a day (TID) | ORAL | 0 refills | Status: DC | PRN

## 2023-08-05 NOTE — ED Provider Notes (Signed)
EMERGENCY DEPARTMENT AT [facility] Provider Note   CSN: [id number] Arrival date & time: 08/05/23  1702     History  Chief Complaint  Patient presents with   Abdominal Pain    With N/V/D    Kaitlyn Gray is a 48 y.o. female.  Pt is a 48 yo female with pmhx significant for htn, bipolar d/o, htn, dm2, polysubstance abuse, cva with left sided weakness.  Pt called EMS today because of n/v.  She thinks she smoked a bad batch of crack early this am.  She has not been taking her meds.  She may have had a fever today.  It is difficult to obtain a hx.         Home Medications Prior to Admission medications   Medication Sig Start Date End Date Taking? Authorizing Provider  ondansetron (ZOFRAN-ODT) 4 MG disintegrating tablet Take 1 tablet (4 mg total) by mouth every 8 (eight) hours as needed. 08/05/23  Yes Jacalyn Lefevre, MD  aspirin EC 81 MG tablet Take 81 mg by mouth daily. Swallow whole. Patient not taking: Reported on 07/04/2023    [provider]  ferrous sulfate 325 (65 FE) MG tablet Take 1 tablet (325 mg total) by mouth 3 (three) times daily with meals. Patient not taking: Reported on 07/04/2023 01/21/23 04/21/23  Cathren Laine, MD  gabapentin (NEURONTIN) 100 MG capsule Take 1 capsule (100 mg total) by mouth 3 (three) times daily for pain scale 4-7. 07/06/23 07/13/23  Elayne Snare K, DO  hydrOXYzine (ATARAX) 25 MG tablet Take 1 tablet (25 mg total) by mouth 3 (three) times daily as needed for anxiety. Patient not taking: Reported on 07/04/2023 02/12/23   Lauro Franklin, MD  losartan (COZAAR) 100 MG tablet Take 1 tablet (100 mg total) by mouth daily. 07/06/23 10/04/23  Rexford Maus, DO  metFORMIN (GLUCOPHAGE) 500 MG tablet Take 1 tablet (500 mg total) by mouth 2 (two) times daily with a meal. Patient not taking: Reported on 07/04/2023 06/04/22 12/24/22  [place], Alvira Philips, DO  nicotine polacrilex (NICORETTE) 2 MG gum Take 1 each (2 mg total) by  mouth every 2 (two) hours as needed for Smoking Cravings Patient not taking: Reported on 07/04/2023 02/12/23   Lauro Franklin, MD  QUEtiapine (SEROQUEL) 100 MG tablet Take 1 tablet (100 mg total) by mouth at bedtime for 7 days. 07/06/23 07/13/23  Elayne Snare K, DO  rosuvastatin (CRESTOR) 40 MG tablet Take 1 tablet (40 mg total) by mouth daily. Patient not taking: Reported on 07/04/2023 06/04/22 12/24/22  [place], Eric J, DO      Allergies    Tomato, Pork-derived products, Hydrocodone, and Latex    Review of Systems   Review of Systems  Gastrointestinal:  Positive for abdominal pain, nausea and vomiting.  All other systems reviewed and are negative.   Physical Exam Updated Vital Signs BP (!) 157/82   Pulse 61   Temp 98.2 F (36.8 C) (Oral)   Resp 18   Ht 5\' 5"  (1.651 m)   Wt 77 kg   LMP  (LMP Unknown)   SpO2 100%   BMI 28.25 kg/m  Physical Exam Vitals and nursing note reviewed.  Constitutional:      Appearance: Normal appearance.  HENT:     Head: Normocephalic and atraumatic.     Right Ear: External ear normal.     Left Ear: External ear normal.     Nose: Nose normal.     Mouth/Throat:  Mouth: Mucous membranes are dry.  Eyes:     Extraocular Movements: Extraocular movements intact.     Conjunctiva/sclera: Conjunctivae normal.     Pupils: Pupils are equal, round, and reactive to light.  Cardiovascular:     Rate and Rhythm: Normal rate and regular rhythm.     Pulses: Normal pulses.     Heart sounds: Normal heart sounds.  Pulmonary:     Effort: Pulmonary effort is normal.     Breath sounds: Normal breath sounds.  Abdominal:     General: Abdomen is flat. Bowel sounds are normal.     Palpations: Abdomen is soft.  Musculoskeletal:        General: Normal range of motion.     Cervical back: Normal range of motion and neck supple.  Skin:    General: Skin is warm.     Capillary Refill: Capillary refill takes less than 2 seconds.  Neurological:     Mental  Status: She is alert.     Comments: Left arm weakness  Psychiatric:        Speech: Speech is tangential.     ED Results / Procedures / Treatments   Labs (all labs ordered are listed, but only abnormal results are displayed) Labs Reviewed  CBC WITH DIFFERENTIAL/PLATELET - Abnormal; Notable for the following components:      Result Value   Hemoglobin 8.1 (*)    HCT 28.3 (*)    MCV 72.2 (*)    MCH 20.7 (*)    MCHC 28.6 (*)    RDW 19.2 (*)    All other components within normal limits  COMPREHENSIVE METABOLIC PANEL - Abnormal; Notable for the following components:   Glucose, Bld 103 (*)    Albumin 3.3 (*)    All other components within normal limits  URINALYSIS, ROUTINE W REFLEX MICROSCOPIC - Abnormal; Notable for the following components:   APPearance CLOUDY (*)    All other components within normal limits  RESP PANEL BY RT-PCR (RSV, FLU A&B, COVID)  RVPGX2  LIPASE, BLOOD  HCG, SERUM, QUALITATIVE    EKG None  Radiology No results found.  Procedures Procedures    Medications Ordered in ED Medications  sodium chloride 0.9 % bolus 1,000 mL (1,000 mLs Intravenous New Bag/Given 08/05/23 1747)  morphine (PF) 4 MG/ML injection 4 mg (4 mg Intravenous Given 08/05/23 1747)  ondansetron (ZOFRAN) injection 4 mg (4 mg Intravenous Given 08/05/23 1746)    ED Course/ Medical Decision Making/ A&P                                 Medical Decision Making Amount and/or Complexity of Data Reviewed Labs: ordered.  Risk Prescription drug management.   This patient presents to the ED for concern of abd pain, this involves an extensive number of treatment options, and is a complaint that carries with it a high risk of complications and morbidity.  The differential diagnosis includes viral syndrome, dka, htn, pregnancy, appendicitis, cholecystitis, uti   Co morbidities that complicate the patient evaluation  htn, bipolar d/o, htn, dm2, polysubstance abuse, cva with left sided  weakness   Additional history obtained:  Additional history obtained from epic chart review External records from outside source obtained and reviewed including EMS report   Lab Tests:  I Ordered, and personally interpreted labs.  The pertinent results include:  cbc with hgb 8.1 (chronic); cmp nl, preg neg, lip nl, covid/flu/rsv neg,  ua neg   Cardiac Monitoring:  The patient was maintained on a cardiac monitor.  I personally viewed and interpreted the cardiac monitored which showed an underlying rhythm of: sb   Medicines ordered and prescription drug management:  I ordered medication including ivfs/morphine/zofran  for sx  Reevaluation of the patient after these medicines showed that the patient improved I have reviewed the patients home medicines and have made adjustments as needed   Test Considered:  Ct, but afebrile and nl wbc   Critical Interventions:  ivfs   Problem List / ED Course:  N/v and abd pain:  pt is feeling much better.  She ate a sandwich, ice cream and drank some sprite without any problems. She is stable for d/c.  Return if worse.  She is encouraged to avoid using crack and is given resources.     Reevaluation:  After the interventions noted above, I reevaluated the patient and found that they have :improved   Social Determinants of Health:  Lives at home   Dispostion:  After consideration of the diagnostic results and the patients response to treatment, I feel that the patent would benefit from discharge with outpatient f/u.          Final Clinical Impression(s) / ED Diagnoses Final diagnoses:  Generalized abdominal pain  Nausea and vomiting, unspecified vomiting type  Cocaine abuse (HCC)  Chronic anemia  Hypertension, unspecified type  History of medication noncompliance    Rx / DC Orders ED Discharge Orders          Ordered    ondansetron (ZOFRAN-ODT) 4 MG disintegrating tablet  Every 8 hours PRN        08/05/23 2047               Jacalyn Lefevre, MD 08/05/23 2050

## 2023-08-05 NOTE — Discharge Instructions (Addendum)
Avoid using crack.  Take your medications as directed by your doctor.

## 2023-08-05 NOTE — ED Triage Notes (Signed)
N/V/D states that she smocked crack at 0100 thinks that she is feeling bad due to "bad crack". Comes from home. 170/102bp 68hr, 100RA CBG 106

## 2023-08-05 NOTE — Care Management (Signed)
Patient will be transported home by [facility]. Taxi voucher will be provided. Charge Nurse [place] made aware.

## 2023-08-05 NOTE — ED Notes (Signed)
Pt ambulated to bathroom with walker with minimal assist

## 2023-08-31 ENCOUNTER — Other Ambulatory Visit: Payer: Self-pay

## 2023-08-31 ENCOUNTER — Emergency Department (HOSPITAL_COMMUNITY)
Admission: EM | Admit: 2023-08-31 | Discharge: 2023-08-31 | Disposition: A | Discharge status: Home / Self Care | Payer: MEDICAID | Attending: Emergency Medicine | Admitting: Emergency Medicine

## 2023-08-31 ENCOUNTER — Emergency Department (HOSPITAL_COMMUNITY): Payer: MEDICAID

## 2023-08-31 DIAGNOSIS — Z79899 Other long term (current) drug therapy: Secondary | ICD-10-CM | POA: Diagnosis not present

## 2023-08-31 DIAGNOSIS — E876 Hypokalemia: Secondary | ICD-10-CM | POA: Diagnosis not present

## 2023-08-31 DIAGNOSIS — R0789 Other chest pain: Secondary | ICD-10-CM | POA: Diagnosis present

## 2023-08-31 DIAGNOSIS — I10 Essential (primary) hypertension: Secondary | ICD-10-CM | POA: Diagnosis not present

## 2023-08-31 DIAGNOSIS — Z91040 Latex allergy status: Secondary | ICD-10-CM | POA: Diagnosis not present

## 2023-08-31 DIAGNOSIS — R0602 Shortness of breath: Secondary | ICD-10-CM | POA: Insufficient documentation

## 2023-08-31 LAB — TROPONIN I (HIGH SENSITIVITY)
Troponin I (High Sensitivity): 4 ng/L (ref <18)
Troponin I (High Sensitivity): 4 ng/L (ref <18)

## 2023-08-31 LAB — CBC
HCT: 26.8 % — ABNORMAL LOW (ref 36.0–46.0)
Hemoglobin: 7.6 g/dL — ABNORMAL LOW (ref 12.0–15.0)
MCH: 20.5 pg — ABNORMAL LOW (ref 26.0–34.0)
MCHC: 28.4 g/dL — ABNORMAL LOW (ref 30.0–36.0)
MCV: 72.4 fL — ABNORMAL LOW (ref 80.0–100.0)
Platelets: 267 10*3/uL (ref 150–400)
RBC: 3.7 MIL/uL — ABNORMAL LOW (ref 3.87–5.11)
RDW: 18.9 % — ABNORMAL HIGH (ref 11.5–15.5)
WBC: 5.2 10*3/uL (ref 4.0–10.5)
nRBC: 0 % (ref 0.0–0.2)

## 2023-08-31 LAB — BASIC METABOLIC PANEL
Anion gap: 11 (ref 5–15)
BUN: 12 mg/dL (ref 6–20)
CO2: 24 mmol/L (ref 22–32)
Calcium: 8.4 mg/dL — ABNORMAL LOW (ref 8.9–10.3)
Chloride: 105 mmol/L (ref 98–111)
Creatinine, Ser: 0.75 mg/dL (ref 0.44–1.00)
GFR, Estimated: >60 mL/min (ref >60)
Glucose, Bld: 94 mg/dL (ref 70–99)
Potassium: 3.2 mmol/L — ABNORMAL LOW (ref 3.5–5.1)
Sodium: 140 mmol/L (ref 135–145)

## 2023-08-31 NOTE — ED Notes (Signed)
 Patient transported to X-ray

## 2023-08-31 NOTE — ED Notes (Signed)
 Patient ambulated with personal rollaider to bathroom

## 2023-08-31 NOTE — ED Provider Notes (Signed)
 [place] EMERGENCY DEPARTMENT AT [facility] Provider Note   CSN: [id number] Arrival date & time: 08/31/23  4098     History  Chief Complaint  Patient presents with   Chest Pain    Kaitlyn Gray is a 48 y.o. female.  With a history of polysubstance abuse, schizoaffective disorder, bipolar 1 disorder, chronic anemia, depression, hypertension, adjustment disorder presenting to the ED for evaluation of chest pain.  Pain is localized to the right side of the chest.  It is described as a constant sharp pain.  Does not radiate.  No nausea, vomiting or diaphoresis.  Minimal associated shortness of breath.  No abdominal pain.  No history of cardiac disease.  It is worse with ambulation.  Presents via EMS.  Was given nitroglycerin and aspirin prior to arrival.  Of note, patient denies alcohol use or recreational drug use.   Chest Pain      Home Medications Prior to Admission medications   Medication Sig Start Date End Date Taking? Authorizing Provider  aspirin EC 81 MG tablet Take 81 mg by mouth daily. Swallow whole. Patient not taking: Reported on 07/04/2023    [provider]  ferrous sulfate 325 (65 FE) MG tablet Take 1 tablet (325 mg total) by mouth 3 (three) times daily with meals. Patient not taking: Reported on 07/04/2023 01/21/23 04/21/23  Cathren Laine, MD  gabapentin (NEURONTIN) 100 MG capsule Take 1 capsule (100 mg total) by mouth 3 (three) times daily for pain scale 4-7. 07/06/23 07/13/23  Elayne Snare K, DO  hydrOXYzine (ATARAX) 25 MG tablet Take 1 tablet (25 mg total) by mouth 3 (three) times daily as needed for anxiety. Patient not taking: Reported on 07/04/2023 02/12/23   Lauro Franklin, MD  losartan (COZAAR) 100 MG tablet Take 1 tablet (100 mg total) by mouth daily. 07/06/23 10/04/23  Rexford Maus, DO  metFORMIN (GLUCOPHAGE) 500 MG tablet Take 1 tablet (500 mg total) by mouth 2 (two) times daily with a meal. Patient not taking: Reported on  07/04/2023 06/04/22 12/24/22  [place], Alvira Philips, DO  nicotine polacrilex (NICORETTE) 2 MG gum Take 1 each (2 mg total) by mouth every 2 (two) hours as needed for Smoking Cravings Patient not taking: Reported on 07/04/2023 02/12/23   Lauro Franklin, MD  ondansetron (ZOFRAN-ODT) 4 MG disintegrating tablet Take 1 tablet (4 mg total) by mouth every 8 (eight) hours as needed. 08/05/23   Jacalyn Lefevre, MD  QUEtiapine (SEROQUEL) 100 MG tablet Take 1 tablet (100 mg total) by mouth at bedtime for 7 days. 07/06/23 07/13/23  Elayne Snare K, DO  rosuvastatin (CRESTOR) 40 MG tablet Take 1 tablet (40 mg total) by mouth daily. Patient not taking: Reported on 07/04/2023 06/04/22 12/24/22  [place], Eric J, DO      Allergies    Tomato, Pork-derived products, Hydrocodone, and Latex    Review of Systems   Review of Systems  Cardiovascular:  Positive for chest pain.  All other systems reviewed and are negative.   Physical Exam Updated Vital Signs BP 130/83 (BP Location: Left Arm)   Pulse (!) 54   Temp 97.6 F (36.4 C) (Oral)   Resp 20   Ht 5\' 5"  (1.651 m)   Wt 77 kg   LMP  (LMP Unknown)   SpO2 100%   BMI 28.25 kg/m  Physical Exam Vitals and nursing note reviewed.  Constitutional:      General: She is not in acute distress.    Appearance:  Normal appearance. She is normal weight. She is not ill-appearing.     Comments: Disheveled, resting comfortably in bed  HENT:     Head: Normocephalic and atraumatic.  Cardiovascular:     Rate and Rhythm: Normal rate and regular rhythm.  Pulmonary:     Effort: Pulmonary effort is normal. No respiratory distress.     Breath sounds: No decreased breath sounds, wheezing, rhonchi or rales.  Abdominal:     General: Abdomen is flat.  Musculoskeletal:        General: Normal range of motion.     Cervical back: Neck supple.  Skin:    General: Skin is warm and dry.  Neurological:     Mental Status: She is alert and oriented to person, place, and time.   Psychiatric:        Mood and Affect: Mood normal.        Behavior: Behavior normal.     ED Results / Procedures / Treatments   Labs (all labs ordered are listed, but only abnormal results are displayed) Labs Reviewed  BASIC METABOLIC PANEL - Abnormal; Notable for the following components:      Result Value   Potassium 3.2 (*)    Calcium 8.4 (*)    All other components within normal limits  CBC - Abnormal; Notable for the following components:   RBC 3.70 (*)    Hemoglobin 7.6 (*)    HCT 26.8 (*)    MCV 72.4 (*)    MCH 20.5 (*)    MCHC 28.4 (*)    RDW 18.9 (*)    All other components within normal limits  RAPID URINE DRUG SCREEN, HOSP PERFORMED  TROPONIN I (HIGH SENSITIVITY)  TROPONIN I (HIGH SENSITIVITY)    EKG EKG Interpretation Date/Time:  Monday August 31 2023 08:08:03 EDT Ventricular Rate:  54 PR Interval:  132 QRS Duration:  90 QT Interval:  480 QTC Calculation: 455 R Axis:   68  Text Interpretation: Sinus bradycardia Otherwise normal ECG When compared with ECG of 04-Jul-2023 12:27, HR has slowed Confirmed by Ernie Avena ([id number]) on 08/31/2023 9:54:59 AM  Radiology DG Chest 1 View Result Date: 08/31/2023 CLINICAL DATA:  Chest pain. EXAM: CHEST  1 VIEW COMPARISON:  05/04/2023 FINDINGS: Low volume lordotic film. The cardio pericardial silhouette is upper normal for size. There is pulmonary vascular congestion without overt pulmonary edema. Possible old anterior right fourth rib fracture. Bones appear demineralized. IMPRESSION: Low volume film without acute cardiopulmonary findings. Electronically Signed   By: Kennith Center M.D.   On: 08/31/2023 08:18    Procedures Procedures    Medications Ordered in ED Medications - No data to display  ED Course/ Medical Decision Making/ A&P             HEART Score: 1                    Medical Decision Making Amount and/or Complexity of Data Reviewed Labs: ordered. Radiology: ordered.  This patient presents to the ED  for concern of chest pain, this involves an extensive number of treatment options, and is a complaint that carries with it a high risk of complications and morbidity.  The emergent differential diagnosis of chest pain includes: Acute coronary syndrome, pericarditis, aortic dissection, pulmonary embolism, tension pneumothorax, and esophageal rupture.  I do not believe the patient has an emergent cause of chest pain, other urgent/non-acute considerations include, but are not limited to: chronic angina, aortic stenosis, cardiomyopathy, myocarditis, mitral  valve prolapse, pulmonary hypertension, hypertrophic obstructive cardiomyopathy (HOCM), aortic insufficiency, right ventricular hypertrophy, pneumonia, pleuritis, bronchitis, pneumothorax, tumor, gastroesophageal reflux disease (GERD), esophageal spasm, Mallory-Weiss syndrome, peptic ulcer disease, biliary disease, pancreatitis, functional gastrointestinal pain, cervical or thoracic disk disease or arthritis, shoulder arthritis, costochondritis, subacromial bursitis, anxiety or panic attack, herpes zoster, breast disorders, chest wall tumors, thoracic outlet syndrome, mediastinitis.  My initial workup includes ACS rule out  Additional history obtained from: Nursing notes from this visit.  I ordered, reviewed and interpreted labs which include: BMP, CBC, troponin, delta troponin.  Anemic but near baseline at 7.6.  No leukocytosis.  hypokalemia of 3.2.  No kidney dysfunction.  Initial and delta troponin negative.  I ordered imaging studies including chest x ray I independently visualized and interpreted imaging which showed negative I agree with the radiologist interpretation  Cardiac Monitoring:  The patient was maintained on a cardiac monitor.  I personally viewed and interpreted the cardiac monitored which showed an underlying rhythm of: NSR  Afebrile, hemodynamically stable.  48 year old female presenting to the ED for evaluation of right sided  chest pain.  Symptoms began earlier today.  She appears well on physical exam.  EKG without acute ischemic changes.  Chest x-ray negative.  Initial and delta troponin negative.  Heart score of 1, low suspicion for ACS.  Patient was requesting food numerous times with staff.  Question if there is a component of malingering as well.  She was encouraged to follow-up with her primary care provider regarding her symptoms.  She was given return precautions.  Stable at discharge.  At this time there does not appear to be any evidence of an acute emergency medical condition and the patient appears stable for discharge with appropriate outpatient follow up. Diagnosis was discussed with patient who verbalizes understanding of care plan and is agreeable to discharge. I have discussed return precautions with patient who verbalizes understanding. Patient encouraged to follow-up with their PCP within 1 week. All questions answered.  Note: Portions of this report may have been transcribed using voice recognition software. Every effort was made to ensure accuracy; however, inadvertent computerized transcription errors may still be present.        Final Clinical Impression(s) / ED Diagnoses Final diagnoses:  Atypical chest pain    Rx / DC Orders ED Discharge Orders     None         Mora Bellman 08/31/23 1339    Ernie Avena, MD 08/31/23 1723

## 2023-08-31 NOTE — ED Notes (Addendum)
 Patient verbalizes understanding of discharge instructions. Opportunity for questioning and answers were provided. Pt given taxi voucher. Pt discharged from ED.

## 2023-08-31 NOTE — ED Notes (Signed)
 Pt informed once second troponin results she can get a bag lunch with sprite. Pt is tearful and receptive to next steps.

## 2023-08-31 NOTE — ED Triage Notes (Signed)
 Pt BIB [facility] from the street corner.  Pt c/o of CP as well and people leaving her.  Pt was given 1 Nitro 324 mg PO ASA BP 170/110 HR 70 CBG 161 96 % on RA.

## 2023-08-31 NOTE — ED Notes (Signed)
Pt refused vitals x 3

## 2023-08-31 NOTE — Discharge Instructions (Signed)
 You have been seen today for your complaint of chest pain. Your lab work was reassuring. Your imaging was reassuring. Follow up with: Your primary care provider Please seek immediate medical care if you develop any of the following symptoms: Your chest pain gets worse. You have a cough that gets worse, or you cough up blood. You have severe pain in your abdomen. You faint. You have sudden, unexplained chest discomfort. You have sudden, unexplained discomfort in your arms, back, neck, or jaw. You have shortness of breath at any time. You suddenly start to sweat, or your skin gets clammy. You feel nausea or you vomit. You suddenly feel lightheaded or dizzy. You have severe weakness, or unexplained weakness or fatigue. Your heart begins to beat quickly, or it feels like it is skipping beats. At this time there does not appear to be the presence of an emergent medical condition, however there is always the potential for conditions to change. Please read and follow the below instructions.  Do not take your medicine if  develop an itchy rash, swelling in your mouth or lips, or difficulty breathing; call 911 and seek immediate emergency medical attention if this occurs.  You may review your lab tests and imaging results in their entirety on your MyChart account.  Please discuss all results of fully with your primary care provider and other specialist at your follow-up visit.  Note: Portions of this text may have been transcribed using voice recognition software. Every effort was made to ensure accuracy; however, inadvertent computerized transcription errors may still be present.

## 2023-09-15 ENCOUNTER — Ambulatory Visit: Payer: MEDICAID | Admitting: Family Medicine

## 2023-09-17 ENCOUNTER — Other Ambulatory Visit: Payer: Self-pay

## 2023-09-17 ENCOUNTER — Encounter (HOSPITAL_COMMUNITY): Payer: Self-pay

## 2023-09-17 ENCOUNTER — Observation Stay (HOSPITAL_COMMUNITY)
Admission: EM | Admit: 2023-09-17 | Discharge: 2023-09-18 | Disposition: A | Discharge status: Home / Self Care | Payer: MEDICAID | Attending: Internal Medicine | Admitting: Internal Medicine

## 2023-09-17 ENCOUNTER — Emergency Department (HOSPITAL_COMMUNITY): Payer: MEDICAID

## 2023-09-17 DIAGNOSIS — Z720 Tobacco use: Secondary | ICD-10-CM | POA: Diagnosis present

## 2023-09-17 DIAGNOSIS — D649 Anemia, unspecified: Principal | ICD-10-CM | POA: Diagnosis present

## 2023-09-17 DIAGNOSIS — Z7982 Long term (current) use of aspirin: Secondary | ICD-10-CM | POA: Insufficient documentation

## 2023-09-17 DIAGNOSIS — N920 Excessive and frequent menstruation with regular cycle: Secondary | ICD-10-CM | POA: Diagnosis not present

## 2023-09-17 DIAGNOSIS — Z79899 Other long term (current) drug therapy: Secondary | ICD-10-CM | POA: Insufficient documentation

## 2023-09-17 DIAGNOSIS — F259 Schizoaffective disorder, unspecified: Secondary | ICD-10-CM | POA: Diagnosis present

## 2023-09-17 DIAGNOSIS — I10 Essential (primary) hypertension: Secondary | ICD-10-CM | POA: Diagnosis present

## 2023-09-17 DIAGNOSIS — R079 Chest pain, unspecified: Secondary | ICD-10-CM | POA: Diagnosis present

## 2023-09-17 DIAGNOSIS — Z1152 Encounter for screening for COVID-19: Secondary | ICD-10-CM | POA: Diagnosis not present

## 2023-09-17 DIAGNOSIS — R29818 Other symptoms and signs involving the nervous system: Secondary | ICD-10-CM | POA: Insufficient documentation

## 2023-09-17 DIAGNOSIS — D509 Iron deficiency anemia, unspecified: Secondary | ICD-10-CM | POA: Diagnosis not present

## 2023-09-17 DIAGNOSIS — E119 Type 2 diabetes mellitus without complications: Secondary | ICD-10-CM | POA: Diagnosis not present

## 2023-09-17 DIAGNOSIS — Z7984 Long term (current) use of oral hypoglycemic drugs: Secondary | ICD-10-CM | POA: Diagnosis not present

## 2023-09-17 DIAGNOSIS — Z8673 Personal history of transient ischemic attack (TIA), and cerebral infarction without residual deficits: Secondary | ICD-10-CM | POA: Diagnosis not present

## 2023-09-17 DIAGNOSIS — I152 Hypertension secondary to endocrine disorders: Secondary | ICD-10-CM | POA: Diagnosis present

## 2023-09-17 DIAGNOSIS — Z91040 Latex allergy status: Secondary | ICD-10-CM | POA: Insufficient documentation

## 2023-09-17 DIAGNOSIS — F250 Schizoaffective disorder, bipolar type: Secondary | ICD-10-CM | POA: Diagnosis present

## 2023-09-17 DIAGNOSIS — E876 Hypokalemia: Secondary | ICD-10-CM | POA: Diagnosis not present

## 2023-09-17 LAB — CBC
HCT: 24.8 % — ABNORMAL LOW (ref 36.0–46.0)
Hemoglobin: 7 g/dL — ABNORMAL LOW (ref 12.0–15.0)
MCH: 20.6 pg — ABNORMAL LOW (ref 26.0–34.0)
MCHC: 28.2 g/dL — ABNORMAL LOW (ref 30.0–36.0)
MCV: 72.9 fL — ABNORMAL LOW (ref 80.0–100.0)
Platelets: 352 10*3/uL (ref 150–400)
RBC: 3.4 MIL/uL — ABNORMAL LOW (ref 3.87–5.11)
RDW: 19 % — ABNORMAL HIGH (ref 11.5–15.5)
WBC: 4 10*3/uL (ref 4.0–10.5)
nRBC: 0 % (ref 0.0–0.2)

## 2023-09-17 LAB — BASIC METABOLIC PANEL WITH GFR
Anion gap: 7 (ref 5–15)
BUN: 10 mg/dL (ref 6–20)
CO2: 26 mmol/L (ref 22–32)
Calcium: 8.7 mg/dL — ABNORMAL LOW (ref 8.9–10.3)
Chloride: 107 mmol/L (ref 98–111)
Creatinine, Ser: 0.94 mg/dL (ref 0.44–1.00)
GFR, Estimated: >60 mL/min (ref >60)
Glucose, Bld: 125 mg/dL — ABNORMAL HIGH (ref 70–99)
Potassium: 3.4 mmol/L — ABNORMAL LOW (ref 3.5–5.1)
Sodium: 140 mmol/L (ref 135–145)

## 2023-09-17 LAB — TECHNOLOGIST SMEAR REVIEW: Plt Morphology: NORMAL

## 2023-09-17 LAB — TROPONIN I (HIGH SENSITIVITY)
Troponin I (High Sensitivity): 6 ng/L (ref <18)
Troponin I (High Sensitivity): 5 ng/L (ref <18)

## 2023-09-17 LAB — RESP PANEL BY RT-PCR (RSV, FLU A&B, COVID)  RVPGX2
Influenza A by PCR: NEGATIVE
Influenza B by PCR: NEGATIVE
Resp Syncytial Virus by PCR: NEGATIVE
SARS Coronavirus 2 by RT PCR: NEGATIVE

## 2023-09-17 LAB — IRON AND TIBC
Iron: 21 ug/dL — ABNORMAL LOW (ref 28–170)
Saturation Ratios: 6 % — ABNORMAL LOW (ref 10.4–31.8)
TIBC: 360 ug/dL (ref 250–450)
UIBC: 339 ug/dL

## 2023-09-17 LAB — FERRITIN: Ferritin: 5 ng/mL — ABNORMAL LOW (ref 11–307)

## 2023-09-17 LAB — PREPARE RBC (CROSSMATCH)

## 2023-09-17 LAB — POC OCCULT BLOOD, ED: Fecal Occult Bld: NEGATIVE

## 2023-09-17 MED ORDER — SODIUM CHLORIDE 0.9 % IV SOLN
100.0000 mg | Freq: Once | INTRAVENOUS | Status: AC
  Administered 2023-09-17: 100 mg via INTRAVENOUS
  Filled 2023-09-17: qty 5

## 2023-09-17 MED ORDER — ACETAMINOPHEN 500 MG PO TABS: 1000.0000 mg | ORAL_TABLET | Freq: Once | ORAL | Status: DC

## 2023-09-17 MED ORDER — SODIUM CHLORIDE 0.9% IV SOLUTION: Freq: Once | INTRAVENOUS | Status: DC

## 2023-09-17 MED ORDER — ADULT MULTIVITAMIN W/MINERALS CH: 1.0000 | ORAL_TABLET | Freq: Every day | ORAL | Status: DC

## 2023-09-17 MED ORDER — ACETAMINOPHEN 650 MG RE SUPP: 650.0000 mg | Freq: Four times a day (QID) | RECTAL | Status: DC | PRN

## 2023-09-17 MED ORDER — POLYETHYLENE GLYCOL 3350 17 G PO PACK: 17.0000 g | PACK | Freq: Every day | ORAL | Status: DC | PRN

## 2023-09-17 MED ORDER — ACETAMINOPHEN 325 MG PO TABS
650.0000 mg | ORAL_TABLET | Freq: Four times a day (QID) | ORAL | Status: DC | PRN
  Administered 2023-09-18: 650 mg via ORAL
  Filled 2023-09-17: qty 2

## 2023-09-17 NOTE — H&P (Signed)
 Date: 09/17/2023               Patient Name:  Kaitlyn Gray MRN: [id number]  DOB: 1976-05-14 Age / Sex: 48 y.o., female   PCP: Default, Provider, MD         Medical Service: Internal Medicine Teaching Service         Attending Physician: Dr. 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   She woke up this morning with chest pain and shortness of breath, so she called EMS.  She also endorses lightheadedness and dizziness.  She says she is currently on her period and has been having menorrhagia her whole life, but it has seemed to worsen recently.  She now goes through around 7 pads per day and also occasionally has to use tampons on top of the pads.  She says that her periods come out like water.  This has been especially bad in the past for 5 months.  She is still on her period today.  She denies any recent illnesses. No abdominal pain, nausea, or vomiting.  She is not sexually active in over 2 years.  She has been using cocaine regularly.  ED Course: Hemoglobin 7.0, FOBT negative Respiratory viral panel negative CT head with no intracranial abnormality Chest x-ray negative Potassium mildly low at 3.4 Troponin 4 --> 6  Meds: *states she does not take any of her medications Aspirin 81 mg Iron 325 mg Gabapentin 100 mg Hydroxyzine 25 mg Losartan 100 mg Metformin 500 mg Seroquel 100 mg Crestor 40 mg  Past Medical History: Past Medical History:  Diagnosis Date    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   Bipolar 1 disorder (HCC)    Cannabis use disorder, moderate, dependence (HCC) 03/24/2015   Chronic anemia    Cocaine use disorder, severe, dependence (HCC) 03/24/2015   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    Encounter for assessment of healthcare decision-making capacity    History of cervical fracture 12/24/2017   nondisplaced fracture lateral mass of C1 on the right on CT 12/24/17   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) 07/19/2017   Hypertension    Hypertensive emergency 05/31/2022   Ischemic stroke (HCC) 01/01/2020   subacute right middle cerebellar peduncle and pons infarction   Left-sided weakness 01/27/2022   Head CT with remote right occipital infarct which is consistent with old left-sided weakness   MDD (major depressive disorder), recurrent severe, 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   Normocytic anemia 02/09/2020   Opiate use    History of Suboxone Therapy until 05/2021   Polysubstance abuse (HCC) 07/19/2017   Prescription drug misadventures (Seroquel)    Past Surgical History: Past Surgical History:  Procedure Laterality Date   CESAREAN SECTION     Social:  Lives: Alone in [place]  Occupation: school Aide, bus aide  Support: no support near by  Level of function: independent in ADLs  and iADLs and uses a walker  PCP: Georganna Skeans, MD Substances: uses 1 black and mild daily, smokes marijuanua daily and uses cocaine Medications: Do not remember to take them Unsure what she is taking   Family History:  Family History  Problem Relation Age of Onset   Hypertension Mother    CAD Mother 37       died of MI at age 72   Hypertension Father    Allergies: Allergies as of 09/17/2023 - Review Complete 09/17/2023  Allergen Reaction Noted   Tomato Anaphylaxis and Other (See Comments) 12/14/2022   Pork-derived products Itching 02/08/2023   Hydrocodone Itching 01/22/2015   Latex  Itching and Rash 12/25/2012   Review of Systems: A complete ROS was negative except as per HPI.   OBJECTIVE:   Physical Exam: Blood pressure (!) 153/88, pulse 60, temperature 98 F (36.7 C), temperature source Oral, resp. rate (!) 22, height 5\' 5"  (1.651 m), weight 77 kg, SpO2 100%.  Constitutional: laying in bed in no distress Cardiovascular: regular rate and rhythm, no m/r/g; euvolemic Pulmonary/Chest: normal work of breathing on room air, lungs clear to auscultation bilaterally Abdominal: soft, non-tender, non-distended MSK: chronic left-sided deficits, chest pain that is reproducible on palpation Neurological: alert & oriented x 3, some chronic cognitive deficits  Skin: warm and dry  Labs: CBC    Component Value Date/Time   WBC 4.0 09/17/2023 0928   RBC 3.40 (L) 09/17/2023 0928   HGB 7.0 (L) 09/17/2023 0928   HCT 24.8 (L) 09/17/2023 0928   PLT 352 09/17/2023 0928   MCV 72.9 (L) 09/17/2023 0928   MCH 20.6 (L) 09/17/2023 0928   MCHC 28.2 (L) 09/17/2023 0928   RDW 19.0 (H) 09/17/2023 0928   LYMPHSABS 1.5 08/05/2023 1741   MONOABS 0.3 08/05/2023 1741   EOSABS 0.1 08/05/2023 1741   BASOSABS 0.0 08/05/2023 1741     CMP     Component Value Date/Time   NA 140 09/17/2023 0928   K 3.4 (L) 09/17/2023 0928   CL 107 09/17/2023 0928   CO2 26 09/17/2023 0928   GLUCOSE 125 (H) 09/17/2023 0928   BUN 10 09/17/2023 0928   CREATININE 0.94 09/17/2023 0928   CALCIUM 8.7 (L) 09/17/2023 0928   PROT 6.9 08/05/2023 1741   ALBUMIN 3.3 (L) 08/05/2023 1741   ALBUMIN 3.7 (L) 01/03/2020 1122   AST 15 08/05/2023 1741   ALT 15 08/05/2023 1741   ALKPHOS 54 08/05/2023 1741   BILITOT 0.4 08/05/2023 1741   GFRNONAA >60 09/17/2023 0928   GFRAA >60 03/14/2020 1604    Imaging: DG Chest Port 1 View CLINICAL DATA:  Chest pain.  EXAM: PORTABLE CHEST 1 VIEW  COMPARISON:  Chest radiograph dated 08/31/2023.  FINDINGS: Mildly enlarged cardiomediastinal silhouette, similar to prior exams.  No focal consolidation, pleural effusion, or pneumothorax. No acute osseous abnormality.  IMPRESSION: No acute cardiopulmonary findings.  Electronically Signed   By: Hart Robinsons M.D.   On: 09/17/2023 14:01 CT Head Wo Contrast CLINICAL DATA:  48 year old female with neurologic deficit. Weakness and aphasia.  EXAM: CT HEAD WITHOUT CONTRAST  TECHNIQUE: Contiguous axial images were obtained from the base of the skull through the vertex without intravenous contrast.  RADIATION DOSE REDUCTION: This exam was performed according to the departmental dose-optimization program which includes automated exposure control, adjustment of the mA and/or kV according to patient size and/or use of iterative reconstruction technique.  COMPARISON:  Brain MRI 07/04/2023.  FINDINGS: Brain: Stable cerebral volume. No acute intracranial  hemorrhage identified. No midline shift, mass effect, or evidence of intracranial mass lesion.  Chronic cortical infarcts in the left superior perirolandic parenchyma, right occipital lobe, right cerebellar hemisphere with encephalomalacia are stable. Stable gray-white matter differentiation throughout the brain. No cortically based acute infarct identified. ASPECTS 10.  Vascular: No suspicious intracranial vascular hyperdensity.  Skull: No acute osseous abnormality identified.  Sinuses/Orbits: Mild but increased paranasal sinus mucosal thickening especially in the ethmoids. Tympanic cavities and mastoids remain clear.  Other: Disconjugate gaze. Visualized scalp soft tissues are within normal limits.  IMPRESSION: 1. No acute intracranial abnormality identified. Stable non contrast CT appearance of chronic ischemic disease. 2. Increased paranasal sinus inflammation since January.  Electronically Signed   By: Odessa Fleming M.D.   On: 09/17/2023 12:33  EKG: personally reviewed my interpretation is sinus rhythm, no new ischemic changes  ASSESSMENT & PLAN:   Assessment & Plan by Problem: Principal Problem:   Symptomatic anemia Active Problems:   Type 2 diabetes mellitus (HCC)   Hypertension associated with diabetes (HCC)   Tobacco abuse   Iron deficiency anemia   Schizoaffective disorder (HCC)  Jazlen DANALY BARI is a 48 y.o. 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 MCV 72.9. Baseline hemoglobin is around 8-9. Has baseline iron deficiency anemia; ferritin one year ago was 5. Denies GI bleeding, FOBT negative. Reports very heavy menstrual bleeding, and is currently on her period, which is almost certainly the cause of her microcytic anemia.  - 1 unit pRBC ordered - Will give one dose of IV iron - iron panel, ferritin - multivitamin - peripheral smear - pregnancy test - Likely discharge tomorrow with outpatient referral to a gynecologist  Hypokalemia Potassium low at 3.3. Repleted. Will recheck tomorrow.   Chronic conditions:  Hypertension associated with diabetes Blood pressure elevated on presentation at 153/88. On losartan 100 mg daily.   Bipolar 1 disorder Schizoaffective disorder Apparently has a history of bipolar 1 disorder and schizoaffective disorder. [facility] for suicidal ideation.  Not currently on any medications. Denies any current SI/HI.  Type 2 diabetes, well-controlled Last hemoglobin A1c 6.3%. On metformin.   History of CVA History of four prior strokes with residual left-sided deficits. Cocaine use is likely contributory. CT head negative. No new focal deficits. On aspirin, crestor.   Tobacco use Polysubstance use Long  history of cocaine and tobacco use, has tried to quit in the past. Encouraged cessation.   Diet: Normal VTE: SCDs IVF: None Code: Full  Prior to Admission Living Arrangement: Home, living alone Anticipated Discharge Location: Home Barriers to Discharge: blood transfusion  Dispo: Admit patient to Observation with expected length of stay less than 2 midnights.  Signed: Annett Fabian, MD Internal Medicine Resident, PGY-1 [facility]  Pager: [phone]  09/17/2023, 3:49 PM

## 2023-09-17 NOTE — Hospital Course (Addendum)
 Symptomatic anemia Presented to the emergency department with chest pain and shortness of breath.  Cardiac workup was negative.  CT head was negative.  Vitals were within normal limits other than mild hypertension.  Hemoglobin was low at 7.0, down from her baseline of 8-9.  She has very significant menorrhagia and states that she is currently on her period.  She says she soaks 7 pads per day.  This has worsened in the past for 5 months, but she notes that she has had heavy periods her entire life.  She has not followed up with a gynecologist about this yet.  She was given 1 unit of packed red blood cells and an IV iron infusion. Her hemoglobin improved to 8.2. An iron panel was checked which demonstrated iron deficiency anemia (ferritin 5, saturation 6, iron 21) and she was discharged with oral iron.  A referral was placed to OB/GYN, who she will need to follow-up with outpatient for her menorrhagia.  She will also need close follow-up with her PCP.   Iron deficiency anemia An iron panel was checked which demonstrated iron deficiency anemia (ferritin 5, saturation 6, iron 21), likely secondary to chronic blood loss. Discharged with oral iron. Recommend outpatient follow up.

## 2023-09-17 NOTE — Plan of Care (Signed)

## 2023-09-17 NOTE — ED Triage Notes (Addendum)
 Pt BIB EMS from home for c/o chest pain. Pt with history of aphasia with 4 strokes with left sided weakness. When EMS arrived pt A&OX2, unsure of baseline.Pt holds chest when she coughs. Pt has had a cough for unk amount of time. Cbg 325. BP 149/90 HR 70 SPO2 100. Received 324asa with EMS. When asked pt points to central chest. Unable to describe pain or answer most questions

## 2023-09-17 NOTE — Discharge Summary (Signed)
 Name: Kaitlyn Gray MRN: [id number] DOB: 1976/05/31 48 y.o. PCP: Default, Provider, MD  Date of Admission: 09/17/2023  9:07 AM Date of Discharge:  09/18/2023 Attending Physician: Dr. Cleda Daub  DISCHARGE DIAGNOSIS:  Primary Problem: Symptomatic anemia   Hospital Problems: Principal Problem:   Symptomatic anemia Active Problems:   Type 2 diabetes mellitus (HCC)   Hypertension associated with diabetes (HCC)   Tobacco abuse   Iron deficiency anemia   Schizoaffective disorder (HCC)    DISCHARGE MEDICATIONS:   Allergies as of 09/18/2023       Reactions   Tomato Anaphylaxis, Other (See Comments)   Pt reports this as an allergy, but has been eating ketchup and pizza on previous visits w/o any s/s of allergic reaction/anaphylaxis.    Hydrocodone Itching   Latex Itching, Rash        Medication List     STOP taking these medications    ondansetron 4 MG disintegrating tablet Commonly known as: ZOFRAN-ODT       TAKE these medications    aspirin EC 81 MG tablet Take 81 mg by mouth daily. Swallow whole.   CertaVite/Antioxidants Tabs Take 1 tablet by mouth daily.   FeroSul 325 (65 Fe) MG tablet Generic drug: ferrous sulfate Take 1 tablet (325 mg total) by mouth every other day. What changed: when to take this   gabapentin 100 MG capsule Commonly known as: NEURONTIN Take 1 capsule (100 mg total) by mouth 3 (three) times daily for pain scale 4-7.   hydrOXYzine 25 MG tablet Commonly known as: ATARAX Take 1 tablet (25 mg total) by mouth 3 (three) times daily as needed for anxiety.   losartan 100 MG tablet Commonly known as: COZAAR Take 1 tablet (100 mg total) by mouth daily.   metFORMIN 500 MG tablet Commonly known as: GLUCOPHAGE Take 1 tablet (500 mg total) by mouth 2 (two) times daily with a meal.   nicotine polacrilex 2 MG gum Commonly known as: Nicorette Take 1 each (2 mg total) by mouth every 2 (two) hours as needed for Smoking Cravings   QUEtiapine  100 MG tablet Commonly known as: SEROquel Take 1 tablet (100 mg total) by mouth at bedtime for 7 days.   rosuvastatin 40 MG tablet Commonly known as: CRESTOR Take 1 tablet (40 mg total) by mouth daily.       DISPOSITION AND FOLLOW-UP:  Kaitlyn Gray was discharged from [facility] in [place] condition. At the hospital follow up visit please address:  Symptomatic anemia, secondary to menorrhagia: Back to baseline after 1 unit pRBC. Referral placed to OB/GYN, ensure she follows up. Recheck CBC and assess for anemia symptoms at outpatient follow up.   Iron deficiency: discharged on oral iron, once every other day. IV iron given in the hospital. Ensure adherence to oral iron outpatient.   Follow-up Appointments:  Follow-up Information     Georganna Skeans, MD. Schedule an appointment as soon as possible for a visit.   Specialty: Family Medicine Why: Please call and make an appointment with your PCP immediately after hospitalization. Contact information: [address] [place] [address] [phone]                 HOSPITAL COURSE:  Patient Summary: Symptomatic anemia Presented to the emergency department with chest pain and shortness of breath.  Cardiac workup was negative.  CT head was negative.  Vitals were within normal limits other than mild hypertension.  Hemoglobin was low at  7.0, down from her baseline of 8-9.  She has very significant menorrhagia and states that she is currently on her period.  She says she soaks 7 pads per day.  This has worsened in the past for 5 months, but she notes that she has had heavy periods her entire life.  She has not followed up with a gynecologist about this yet.  She was given 1 unit of packed red blood cells and an IV iron infusion. Her hemoglobin improved to 8.2. An iron panel was checked which demonstrated iron deficiency anemia (ferritin 5, saturation 6, iron 21) and she was discharged with oral iron.   A referral was placed to OB/GYN, who she will need to follow-up with outpatient for her menorrhagia.  She will also need close follow-up with her PCP.   Iron deficiency anemia An iron panel was checked which demonstrated iron deficiency anemia (ferritin 5, saturation 6, iron 21), likely secondary to chronic blood loss. Discharged with oral iron. Recommend outpatient follow up.     DISCHARGE INSTRUCTIONS:   Discharge Instructions     Ambulatory referral to Obstetrics / Gynecology   Complete by: As directed    Heavy menstrual bleeding with >7 pads per day, chronic but worsening in the past 6 months, admitted to the hospital for symptomatic anemia   Call MD for:  difficulty breathing, headache or visual disturbances   Complete by: As directed    Call MD for:  extreme fatigue   Complete by: As directed    Call MD for:  persistant dizziness or light-headedness   Complete by: As directed    Call MD for:  persistant nausea and vomiting   Complete by: As directed    Call MD for:  severe uncontrolled pain   Complete by: As directed    Diet - low sodium heart healthy   Complete by: As directed    Discharge instructions   Complete by: As directed    Is a pleasure taking care of you at [facility].  You were admitted for symptomatic anemia (low blood levels), likely due to your heavy menstrual bleeding.  We have given you a blood transfusion.  You were also found to be iron deficient, so we gave you IV iron and we will send you home with oral iron.  It is very important for you to follow-up with your primary care provider, Georganna Skeans, MD, in the next week or 2.  Please call them as soon as you leave the hospital to schedule hospital follow-up appointment.  We have placed a referral to the gynecologist, who should call you to schedule an appointment.  They can help you better manage your heavy periods and avoid becoming anemic in the future.  Please note the following changes to  medications:  Take oral iron, 1 tablet every other day with meals Take a daily multivitamin Take metformin twice daily with meals to control your blood sugars Take crestor 40 mg once daily and aspirin 81 mg once daily to reduce your risk of future strokes Take losartan 100 mg once daily for high blood pressure  As we discussed, it is very important for you to refrain from continuing to use drugs such as cocaine.  These greatly increase your risk of future strokes.  If you have any new weakness, lightheadedness or dizziness, numbness or tingling, or vision changes, please return to the emergency department for evaluation.  Otherwise, please follow-up with your outpatient doctors.   Increase activity slowly  Complete by: As directed        SUBJECTIVE:   Doing better this morning. Still reports some "weakness" in her head. No shortness of breath, chest pain this morning. Medically stable for discharge with outpatient follow up.   Discharge Vitals:   BP (!) 162/102 (BP Location: Left Arm)   Pulse (!) 55   Temp (!) 97.3 F (36.3 C) (Oral)   Resp 16   Ht 5\' 5"  (1.651 m)   Wt 77 kg   SpO2 100%   BMI 28.25 kg/m   OBJECTIVE:  Physical Exam Constitutional:      Appearance: Normal appearance.  HENT:     Nose: Nose normal.     Mouth/Throat:     Mouth: Mucous membranes are moist.     Pharynx: Oropharynx is clear.  Eyes:     Extraocular Movements: Extraocular movements intact.     Pupils: Pupils are equal, round, and reactive to light.  Cardiovascular:     Rate and Rhythm: Normal rate and regular rhythm.     Pulses: Normal pulses.     Heart sounds: Normal heart sounds.  Pulmonary:     Effort: Pulmonary effort is normal.     Breath sounds: Normal breath sounds.  Abdominal:     General: Abdomen is flat.     Palpations: Abdomen is soft.  Musculoskeletal:        General: Normal range of motion.     Cervical back: Normal range of motion.  Skin:    General: Skin is warm and  dry.  Neurological:     Mental Status: She is alert. Mental status is at baseline.     Pertinent Labs, Studies, and Procedures:     Latest Ref Rng & Units 09/18/2023    5:09 AM 09/17/2023    9:28 AM 08/31/2023    7:24 AM  CBC  WBC 4.0 - 10.5 K/uL 6.1  4.0  5.2   Hemoglobin 12.0 - 15.0 g/dL 8.2  7.0  7.6   Hematocrit 36.0 - 46.0 % 27.8  24.8  26.8   Platelets 150 - 400 K/uL 359  352  267        Latest Ref Rng & Units 09/18/2023    5:09 AM 09/17/2023    9:28 AM 08/31/2023    7:24 AM  CMP  Glucose 70 - 99 mg/dL 829  562  94   BUN 6 - 20 mg/dL 17  10  12    Creatinine 0.44 - 1.00 mg/dL 1.30  8.65  7.84   Sodium 135 - 145 mmol/L 140  140  140   Potassium 3.5 - 5.1 mmol/L 3.9  3.4  3.2   Chloride 98 - 111 mmol/L 106  107  105   CO2 22 - 32 mmol/L 26  26  24    Calcium 8.9 - 10.3 mg/dL 8.5  8.7  8.4    DG Chest Port 1 View Result Date: 09/17/2023 CLINICAL DATA:  Chest pain. EXAM: PORTABLE CHEST 1 VIEW COMPARISON:  Chest radiograph dated 08/31/2023. FINDINGS: Mildly enlarged cardiomediastinal silhouette, similar to prior exams. No focal consolidation, pleural effusion, or pneumothorax. No acute osseous abnormality. IMPRESSION: No acute cardiopulmonary findings. Electronically Signed   By: Hart Robinsons M.D.   On: 09/17/2023 14:01   CT Head Wo Contrast Result Date: 09/17/2023 CLINICAL DATA:  48 year old female with neurologic deficit. Weakness and aphasia. EXAM: CT HEAD WITHOUT CONTRAST TECHNIQUE: Contiguous axial images were obtained from the base of the skull through the  vertex without intravenous contrast. RADIATION DOSE REDUCTION: This exam was performed according to the departmental dose-optimization program which includes automated exposure control, adjustment of the mA and/or kV according to patient size and/or use of iterative reconstruction technique. COMPARISON:  Brain MRI 07/04/2023. FINDINGS: Brain: Stable cerebral volume. No acute intracranial hemorrhage identified. No midline shift,  mass effect, or evidence of intracranial mass lesion. Chronic cortical infarcts in the left superior perirolandic parenchyma, right occipital lobe, right cerebellar hemisphere with encephalomalacia are stable. Stable gray-white matter differentiation throughout the brain. No cortically based acute infarct identified. ASPECTS 10. Vascular: No suspicious intracranial vascular hyperdensity. Skull: No acute osseous abnormality identified. Sinuses/Orbits: Mild but increased paranasal sinus mucosal thickening especially in the ethmoids. Tympanic cavities and mastoids remain clear. Other: Disconjugate gaze. Visualized scalp soft tissues are within normal limits. IMPRESSION: 1. No acute intracranial abnormality identified. Stable non contrast CT appearance of chronic ischemic disease. 2. Increased paranasal sinus inflammation since January. Electronically Signed   By: Odessa Fleming M.D.   On: 09/17/2023 12:33     Signed: Annett Fabian, MD Internal Medicine Resident, PGY-1 [facility]  Pager: [phone] 10:26 AM, 09/18/2023

## 2023-09-17 NOTE — ED Notes (Signed)
 Pt refuses IV at this time

## 2023-09-17 NOTE — ED Provider Notes (Signed)
 [place] EMERGENCY DEPARTMENT AT [facility] Provider Note   CSN: [id number] Arrival date & time: 09/17/23  0907     History  Chief Complaint  Patient presents with   Chest Pain   HPI Kaitlyn Gray is a 48 y.o. female with history of multiple strokes, left-sided deficits, hypertension, type 2 diabetes presenting for chest pain.  Started at 8 am this morning.  Chest pain is central at times radiates to the left.  Endorses associated shortness of breath.  Patient unable to report whether or not it is exertional.  Unable to characterize the pain as well.  Was brought in by EMS who gave her 324 mg of aspirin.  She also reported a cough as well.  Denies bloody stool or abnormal vaginal bleeding.   Chest Pain      Home Medications Prior to Admission medications   Medication Sig Start Date End Date Taking? Authorizing Provider  aspirin EC 81 MG tablet Take 81 mg by mouth daily. Swallow whole. Patient not taking: Reported on 07/04/2023    [provider]  ferrous sulfate 325 (65 FE) MG tablet Take 1 tablet (325 mg total) by mouth 3 (three) times daily with meals. Patient not taking: Reported on 07/04/2023 01/21/23 04/21/23  Cathren Laine, MD  gabapentin (NEURONTIN) 100 MG capsule Take 1 capsule (100 mg total) by mouth 3 (three) times daily for pain scale 4-7. 07/06/23 07/13/23  Elayne Snare K, DO  hydrOXYzine (ATARAX) 25 MG tablet Take 1 tablet (25 mg total) by mouth 3 (three) times daily as needed for anxiety. Patient not taking: Reported on 07/04/2023 02/12/23   Lauro Franklin, MD  losartan (COZAAR) 100 MG tablet Take 1 tablet (100 mg total) by mouth daily. 07/06/23 10/04/23  Rexford Maus, DO  metFORMIN (GLUCOPHAGE) 500 MG tablet Take 1 tablet (500 mg total) by mouth 2 (two) times daily with a meal. Patient not taking: Reported on 07/04/2023 06/04/22 12/24/22  [place], Alvira Philips, DO  nicotine polacrilex (NICORETTE) 2 MG gum Take 1 each (2 mg total) by mouth  every 2 (two) hours as needed for Smoking Cravings Patient not taking: Reported on 07/04/2023 02/12/23   Lauro Franklin, MD  ondansetron (ZOFRAN-ODT) 4 MG disintegrating tablet Take 1 tablet (4 mg total) by mouth every 8 (eight) hours as needed. 08/05/23   Jacalyn Lefevre, MD  QUEtiapine (SEROQUEL) 100 MG tablet Take 1 tablet (100 mg total) by mouth at bedtime for 7 days. 07/06/23 07/13/23  Elayne Snare K, DO  rosuvastatin (CRESTOR) 40 MG tablet Take 1 tablet (40 mg total) by mouth daily. Patient not taking: Reported on 07/04/2023 06/04/22 12/24/22  [place], Eric J, DO      Allergies    Tomato, Pork-derived products, Hydrocodone, and Latex    Review of Systems   Review of Systems  Cardiovascular:  Positive for chest pain.    Physical Exam Updated Vital Signs BP (!) 147/82   Pulse 62   Temp 97.7 F (36.5 C) (Oral)   Resp (!) 22   Ht 5\' 5"  (1.651 m)   Wt 77 kg   SpO2 100%   BMI 28.25 kg/m  Physical Exam Vitals and nursing note reviewed. Exam conducted with a chaperone present.  HENT:     Head: Normocephalic and atraumatic.     Mouth/Throat:     Mouth: Mucous membranes are moist.  Eyes:     General:        Right eye: No discharge.  Left eye: No discharge.     Conjunctiva/sclera: Conjunctivae normal.  Cardiovascular:     Rate and Rhythm: Normal rate and regular rhythm.     Pulses: Normal pulses.     Heart sounds: Normal heart sounds.  Pulmonary:     Effort: Pulmonary effort is normal.     Breath sounds: Normal breath sounds.  Abdominal:     General: Abdomen is flat.     Palpations: Abdomen is soft.  Genitourinary:    Rectum: Normal. Guaiac result negative.  Skin:    General: Skin is warm and dry.  Neurological:     General: No focal deficit present.     Mental Status: She is alert and oriented to person, place, and time.     Motor: Weakness present.     Comments: Left-sided weakness noted in upper and lower extremities.  She is able to move all her  extremities.  Psychiatric:        Mood and Affect: Mood normal.     ED Results / Procedures / Treatments   Labs (all labs ordered are listed, but only abnormal results are displayed) Labs Reviewed  BASIC METABOLIC PANEL WITH GFR - Abnormal; Notable for the following components:      Result Value   Potassium 3.4 (*)    Glucose, Bld 125 (*)    Calcium 8.7 (*)    All other components within normal limits  CBC - Abnormal; Notable for the following components:   RBC 3.40 (*)    Hemoglobin 7.0 (*)    HCT 24.8 (*)    MCV 72.9 (*)    MCH 20.6 (*)    MCHC 28.2 (*)    RDW 19.0 (*)    All other components within normal limits  RESP PANEL BY RT-PCR (RSV, FLU A&B, COVID)  RVPGX2  TECHNOLOGIST SMEAR REVIEW  IRON AND TIBC  FERRITIN  POC OCCULT BLOOD, ED  PREPARE RBC (CROSSMATCH)  TYPE AND SCREEN  TROPONIN I (HIGH SENSITIVITY)  TROPONIN I (HIGH SENSITIVITY)    EKG None  Radiology DG Chest Port 1 View Result Date: 09/17/2023 CLINICAL DATA:  Chest pain. EXAM: PORTABLE CHEST 1 VIEW COMPARISON:  Chest radiograph dated 08/31/2023. FINDINGS: Mildly enlarged cardiomediastinal silhouette, similar to prior exams. No focal consolidation, pleural effusion, or pneumothorax. No acute osseous abnormality. IMPRESSION: No acute cardiopulmonary findings. Electronically Signed   By: Hart Robinsons M.D.   On: 09/17/2023 14:01   CT Head Wo Contrast Result Date: 09/17/2023 CLINICAL DATA:  48 year old female with neurologic deficit. Weakness and aphasia. EXAM: CT HEAD WITHOUT CONTRAST TECHNIQUE: Contiguous axial images were obtained from the base of the skull through the vertex without intravenous contrast. RADIATION DOSE REDUCTION: This exam was performed according to the departmental dose-optimization program which includes automated exposure control, adjustment of the mA and/or kV according to patient size and/or use of iterative reconstruction technique. COMPARISON:  Brain MRI 07/04/2023. FINDINGS: Brain:  Stable cerebral volume. No acute intracranial hemorrhage identified. No midline shift, mass effect, or evidence of intracranial mass lesion. Chronic cortical infarcts in the left superior perirolandic parenchyma, right occipital lobe, right cerebellar hemisphere with encephalomalacia are stable. Stable gray-white matter differentiation throughout the brain. No cortically based acute infarct identified. ASPECTS 10. Vascular: No suspicious intracranial vascular hyperdensity. Skull: No acute osseous abnormality identified. Sinuses/Orbits: Mild but increased paranasal sinus mucosal thickening especially in the ethmoids. Tympanic cavities and mastoids remain clear. Other: Disconjugate gaze. Visualized scalp soft tissues are within normal limits. IMPRESSION: 1. No acute  intracranial abnormality identified. Stable non contrast CT appearance of chronic ischemic disease. 2. Increased paranasal sinus inflammation since January. Electronically Signed   By: Odessa Fleming M.D.   On: 09/17/2023 12:33    Procedures .Critical Care  Performed by: Gareth Eagle, PA-C Authorized by: Gareth Eagle, PA-C   Critical care provider statement:    Critical care time (minutes):  30   Critical care was necessary to treat or prevent imminent or life-threatening deterioration of the following conditions: symptomatic anemia requiring blood transfusion.   Critical care was time spent personally by me on the following activities:  Development of treatment plan with patient or surrogate, discussions with consultants, evaluation of patient's response to treatment, examination of patient, ordering and review of laboratory studies, ordering and review of radiographic studies, ordering and performing treatments and interventions, pulse oximetry, re-evaluation of patient's condition and review of old charts     Medications Ordered in ED Medications  acetaminophen (TYLENOL) tablet 1,000 mg (has no administration in time range)  0.9 %   sodium chloride infusion (Manually program via Guardrails IV Fluids) (has no administration in time range)  multivitamin with minerals tablet 1 tablet (has no administration in time range)    ED Course/ Medical Decision Making/ A&P Clinical Course as of 09/17/23 1502  Thu Sep 17, 2023  1306 nRBC: 0.0 [JR]    Clinical Course User Index [JR] Gareth Eagle, PA-C                                 Medical Decision Making Amount and/or Complexity of Data Reviewed Labs: ordered. Decision-making details documented in ED Course. Radiology: ordered.  Risk OTC drugs. Prescription drug management. Decision regarding hospitalization.   Initial Impression and Ddx 48 year old well-appearing female presenting for chest pain.  Exam notable for left-sided deficit but otherwise unremarkable.  DDx includes ACS, PE, stroke, arrhythmia, pneumonia, pneumothorax, aortic dissection, other. Patient PMH that increases complexity of ED encounter:  history of multiple strokes, left-sided deficits, hypertension, type 2 diabetes  Interpretation of Diagnostics - I independent reviewed and interpreted the labs as followed: anemia (Hgb 7.0 down from 8.1 a month ago)  - I independently visualized the following imaging with scope of interpretation limited to determining acute life threatening conditions related to emergency care: Chest x-ray, which revealed no acute findings, CT head was without acute intracranial abnormality  -I personally reviewed and interpreted EKG which revealed sinus rhythm  Patient Reassessment and Ultimate Disposition/Management Workup suggestive of symptomatic anemia with unknown source.  She remains hemodynamically stable in no acute distress.  Ordered 1 unit of PRBCs.  Admitted to hospital service with Dr. Annett Fabian.   Patient management required discussion with the following services or consulting groups:  Hospitalist Service  Complexity of Problems Addressed Acute  complicated illness or Injury  Additional Data Reviewed and Analyzed Further history obtained from: Past medical history and medications listed in the EMR and Prior ED visit notes  Patient Encounter Risk Assessment Consideration of hospitalization         Final Clinical Impression(s) / ED Diagnoses Final diagnoses:  Anemia, unspecified type    Rx / DC Orders ED Discharge Orders     None         Gareth Eagle, PA-C 09/17/23 1502    Benjiman Core, MD 09/17/23 1541

## 2023-09-18 ENCOUNTER — Other Ambulatory Visit (HOSPITAL_COMMUNITY): Payer: Self-pay

## 2023-09-18 DIAGNOSIS — D649 Anemia, unspecified: Secondary | ICD-10-CM | POA: Diagnosis not present

## 2023-09-18 LAB — CBC
HCT: 27.8 % — ABNORMAL LOW (ref 36.0–46.0)
Hemoglobin: 8.2 g/dL — ABNORMAL LOW (ref 12.0–15.0)
MCH: 21.6 pg — ABNORMAL LOW (ref 26.0–34.0)
MCHC: 29.5 g/dL — ABNORMAL LOW (ref 30.0–36.0)
MCV: 73.4 fL — ABNORMAL LOW (ref 80.0–100.0)
Platelets: 359 10*3/uL (ref 150–400)
RBC: 3.79 MIL/uL — ABNORMAL LOW (ref 3.87–5.11)
RDW: 19.3 % — ABNORMAL HIGH (ref 11.5–15.5)
WBC: 6.1 10*3/uL (ref 4.0–10.5)
nRBC: 0 % (ref 0.0–0.2)

## 2023-09-18 LAB — BASIC METABOLIC PANEL WITH GFR
Anion gap: 8 (ref 5–15)
BUN: 17 mg/dL (ref 6–20)
CO2: 26 mmol/L (ref 22–32)
Calcium: 8.5 mg/dL — ABNORMAL LOW (ref 8.9–10.3)
Chloride: 106 mmol/L (ref 98–111)
Creatinine, Ser: 0.86 mg/dL (ref 0.44–1.00)
GFR, Estimated: >60 mL/min (ref >60)
Glucose, Bld: 106 mg/dL — ABNORMAL HIGH (ref 70–99)
Potassium: 3.9 mmol/L (ref 3.5–5.1)
Sodium: 140 mmol/L (ref 135–145)

## 2023-09-18 LAB — TYPE AND SCREEN
ABO/RH(D): O POS
Antibody Screen: NEGATIVE
Unit division: 0

## 2023-09-18 LAB — BPAM RBC
Blood Product Expiration Date: 202504272359
ISSUE DATE / TIME: 202504031456
Unit Type and Rh: 5100

## 2023-09-18 LAB — HIV ANTIBODY (ROUTINE TESTING W REFLEX): HIV Screen 4th Generation wRfx: NONREACTIVE

## 2023-09-18 MED ORDER — GABAPENTIN 100 MG PO CAPS
100.0000 mg | ORAL_CAPSULE | Freq: Three times a day (TID) | ORAL | Status: DC
Start: 2023-09-18 — End: 2023-09-18

## 2023-09-18 MED ORDER — ADULT MULTIVITAMIN W/MINERALS CH
1.0000 | ORAL_TABLET | Freq: Every day | ORAL | 2 refills | Status: DC
  Filled 2023-09-18: qty 90, 90d supply, fill #0

## 2023-09-18 MED ORDER — MEGESTROL ACETATE 20 MG PO TABS
20.0000 mg | ORAL_TABLET | Freq: Every day | ORAL | Status: DC
  Filled 2023-09-18: qty 1

## 2023-09-18 MED ORDER — GABAPENTIN 100 MG PO CAPS
100.0000 mg | ORAL_CAPSULE | Freq: Three times a day (TID) | ORAL | 0 refills | Status: DC
  Filled 2023-09-18: qty 30, 10d supply, fill #0

## 2023-09-18 MED ORDER — METFORMIN HCL 500 MG PO TABS
500.0000 mg | ORAL_TABLET | Freq: Two times a day (BID) | ORAL | 2 refills | Status: DC
  Filled 2023-09-18: qty 60, 30d supply, fill #0

## 2023-09-18 MED ORDER — LOSARTAN POTASSIUM 100 MG PO TABS
100.0000 mg | ORAL_TABLET | Freq: Every day | ORAL | 2 refills | Status: DC
  Filled 2023-09-18: qty 90, 90d supply, fill #0

## 2023-09-18 MED ORDER — FERROUS SULFATE 325 (65 FE) MG PO TABS
325.0000 mg | ORAL_TABLET | ORAL | 2 refills | Status: DC
  Filled 2023-09-18: qty 90, 180d supply, fill #0

## 2023-09-18 MED ORDER — ROSUVASTATIN CALCIUM 40 MG PO TABS
40.0000 mg | ORAL_TABLET | Freq: Every day | ORAL | 2 refills | Status: DC
  Filled 2023-09-18: qty 30, 30d supply, fill #0

## 2023-09-18 NOTE — TOC Progression Note (Signed)
 Transition of Care [facility]) - Progression Note    Patient Details  Name: Kaitlyn Gray MRN: [id number] Date of Birth: 31-May-1976  Transition of Care [facility]) CM/SW Contact  Erin Sons, [place] Phone Number: 09/18/2023, 10:28 AM  Clinical Narrative:     CSW informed by attending team that pt to DC today and reported she does not have electricity at her home. CSW met with pt bedside. She states she has not been able to pay her electricity bill for two months. CSW explains local resources and provides printed resource list for utility assistance. Pt then states she does not want to return to where she has been staying. CSW inquired where she has been staying and she states "on the street." CSW unable to get clarification if she is on the street or at a home/apartment. She states she would go to a shelter if she needs to. CSW explained local shelter and housing resources which are also included on resource list.    Expected Discharge Plan and Services         Expected Discharge Date: 09/18/23                                     Social Determinants of Health (SDOH) Interventions SDOH Screenings   Food Insecurity: Food Insecurity Present (09/17/2023)  Housing: Low Risk  (09/17/2023)  Transportation Needs: Unmet Transportation Needs (09/17/2023)  Utilities: Not At Risk (09/17/2023)  Alcohol Screen: Low Risk  (02/09/2023)  Depression (PHQ2-9): High Risk (07/17/2022)  Tobacco Use: High Risk (09/17/2023)    Readmission Risk Interventions    05/02/2022    1:55 PM 04/09/2022   11:37 AM  Readmission Risk Prevention Plan  Transportation Screening Complete Complete  Medication Review Oceanographer) Complete Complete  PCP or Specialist appointment within 3-5 days of discharge Complete Complete  HRI or Home Care Consult Complete Complete  SW Recovery Care/Counseling Consult Complete   Palliative Care Screening Not Applicable Not Applicable  Skilled Nursing Facility Complete Complete

## 2023-09-18 NOTE — Plan of Care (Signed)
  Problem: Education: Goal: Knowledge of General Education information will improve Description: Including pain rating scale, medication(s)/side effects and non-pharmacologic comfort measures 09/18/2023 1116 by Kelli Hope, RN Outcome: Adequate for Discharge 09/18/2023 1116 by Kelli Hope, RN Outcome: Progressing   Problem: Health Behavior/Discharge Planning: Goal: Ability to manage health-related needs will improve 09/18/2023 1116 by Kelli Hope, RN Outcome: Adequate for Discharge 09/18/2023 1116 by Kelli Hope, RN Outcome: Progressing   Problem: Clinical Measurements: Goal: Ability to maintain clinical measurements within normal limits will improve 09/18/2023 1116 by Kelli Hope, RN Outcome: Adequate for Discharge 09/18/2023 1116 by Kelli Hope, RN Outcome: Progressing Goal: Will remain free from infection 09/18/2023 1116 by Kelli Hope, RN Outcome: Adequate for Discharge 09/18/2023 1116 by Kelli Hope, RN Outcome: Progressing Goal: Diagnostic test results will improve 09/18/2023 1116 by Kelli Hope, RN Outcome: Adequate for Discharge 09/18/2023 1116 by Kelli Hope, RN Outcome: Progressing Goal: Respiratory complications will improve 09/18/2023 1116 by Kelli Hope, RN Outcome: Adequate for Discharge 09/18/2023 1116 by Kelli Hope, RN Outcome: Progressing Goal: Cardiovascular complication will be avoided 09/18/2023 1116 by Kelli Hope, RN Outcome: Adequate for Discharge 09/18/2023 1116 by Kelli Hope, RN Outcome: Progressing   Problem: Activity: Goal: Risk for activity intolerance will decrease 09/18/2023 1116 by Kelli Hope, RN Outcome: Adequate for Discharge 09/18/2023 1116 by Kelli Hope, RN Outcome: Progressing   Problem: Nutrition: Goal: Adequate nutrition will be maintained 09/18/2023 1116 by Kelli Hope, RN Outcome: Adequate for Discharge 09/18/2023 1116 by Kelli Hope, RN Outcome: Progressing    Problem: Coping: Goal: Level of anxiety will decrease 09/18/2023 1116 by Kelli Hope, RN Outcome: Adequate for Discharge 09/18/2023 1116 by Kelli Hope, RN Outcome: Progressing   Problem: Elimination: Goal: Will not experience complications related to bowel motility 09/18/2023 1116 by Kelli Hope, RN Outcome: Adequate for Discharge 09/18/2023 1116 by Kelli Hope, RN Outcome: Progressing Goal: Will not experience complications related to urinary retention 09/18/2023 1116 by Kelli Hope, RN Outcome: Adequate for Discharge 09/18/2023 1116 by Kelli Hope, RN Outcome: Progressing   Problem: Pain Managment: Goal: General experience of comfort will improve and/or be controlled 09/18/2023 1116 by Kelli Hope, RN Outcome: Adequate for Discharge 09/18/2023 1116 by Kelli Hope, RN Outcome: Progressing   Problem: Safety: Goal: Ability to remain free from injury will improve 09/18/2023 1116 by Kelli Hope, RN Outcome: Adequate for Discharge 09/18/2023 1116 by Kelli Hope, RN Outcome: Progressing   Problem: Skin Integrity: Goal: Risk for impaired skin integrity will decrease 09/18/2023 1116 by Kelli Hope, RN Outcome: Adequate for Discharge 09/18/2023 1116 by Kelli Hope, RN Outcome: Progressing

## 2023-09-18 NOTE — Discharge Instructions (Signed)
 SUNDAYS BREAKFAST TWO LOCATIONS: 8:00am served in [facility] by Awaken [employer] 8:30am SHUTTLE provided from [facility], served at [employer], [address] LUNCH TWO LOCATIONS [plus one additional third Sunday only] 10:30am - 12:30pm served at [employer], [employer], [place] [address] (1.2 miles from [facility]) 12:30pm served in [place] by Land [employer]Lakes Team (THIRD Sunday only) 1:30pm served at [facility] by [facility] one location [plus one additional third Sunday only] 5:00pm Every Sunday, served under the bridge at [address] by Lindell Noe Under the [employer]M Company (.7 miles from [facility]) (THIRD Sunday ONLY) 4:00pm served in the parking garage, across from [employer], corner of [place] and [place] by Ryland [employer] Ministries MONDAYS BREAKFAST 7:30am served in [employer] by the [employer] and Friends LUNCH 10:30am - 12:30pm served at [employer], [employer], [place] [address] (1.2 miles from [facility]) DINNER TWO LOCATIONS: 7:00pm served in front of the courthouse at the corner of [employer] and [employer]. by [facility] Monday Night Meal (3 blocks from [facility]) 4:30pm served at the [employer], [address] by The Pro[employer] Not Bombs (0.6 miles from [facility]) [place] BREAKFAST 8:00am - 9:00am served at The [employer]JX Companies, [address] (0.3 miles from [place]) LUNCH 10:30am - 12:30pm served at the [employer], [employer] [address] (1.2 miles from [place]) DINNER 6:00pm served at [employer]X [employer], enter from Capital One and go to the S[employer], (0.7 miles from [place]) [facility] BREAKFAST 7:00am - 8:00am served at [employer], [employer] [address] (1.2 miles from [place]) LUNCH ONE LOCATION [plus two additional locations listed below] 10:30am - 12:30pm served at [employer], [employer] [address] (1.2 miles from [place]) (FIRST Wednesday ONLY) 11:30am served at [employer]illard's, [place] [address] (6.6 miles from [place]) (SECOND Wednesday ONLY) 11:00am served at [place]. Melvyn Novas of [address], [address] (1.3 miles from [place]) [place] TWO LOCATIONS 6:00pm served at W. [employer]. [employer]kley, [place] [address]. (1.3 miles from [facility]) 4:00pm - 6:00pm (hot dogs and chips) served at Levi Strauss of [facility], [address] (1.7 miles from [place]) [place] BREAKFAST NOT AVAILABLE AT THIS TIME LUNCH 10:30am - 12:30pm served at [employer], [employer], [place] [address] (1.2 miles from [place]) DINNER 6:00pm served at [employer], enter from Capital One and go to the [employer], (0.7 miles from [employer]) [place] BREAKFAST NOT AVAILABLE AT THIS TIME LUNCH 10:30am - 12:30pm served at [employer], [employer] [address], (1.2 miles from [place]) DINNER TWO LOCATIONS, [plus one additional first Friday only] 6:00pm served under the bridge at [address] by Lindell Noe Under [employer]. (.7 miles from [facility]) 5:00pm - 7:00pm served at Levi Strauss of [facility] [place], [address] (1.7 miles from [place]) (FIRST Friday ONLY) 5:45 pm - SHUTTLE provided from the LIBRARY at 5:45pm. Served at [facility], [address] SATURDAYS BREAKFAST TWO LOCATIONS [plus one additional last Saturday only] 8:00am served at [facility] by Gann[employer]  Bikers 8:30am served at [employer], [address] (2.2 miles from [facility]) (LAST Saturday ONLY) 8:30am served at  [employer], [address] (5 miles from [place]) LUNCH 10:30am - 12:30pm served at [employer], [employer] [address] (1.2 miles from [facility]) DINNER 6:00pm served under the bridge at [address] by World [employer] Services Corporation (0.7 miles from [employer])  DIRECTIONS FROM [place] TO ALL MEAL LOCATIONS The Bridge at [address] (.7 miles from [address]) [address] on [place]. Turn Right onto DIRECTV 433 ft. Continue onto [address] under bridge, about 500 ft. Courthouse (3 blocks from [facility]) [place] on [address]. Turn right on [place] 1 block to PPL Corporation (.5 miles from [place]) [place] on [place]. YRC Worldwide. past Brink's Company to EMCOR. Enter from Capital One and go to the Affiliated Computer Services building W. [employer]. Berkley [address]. (1.3 miles from [facility]) [address] on [place]. Turn Right onto [address]. church will be on the Left. The TJX Companies [address] (.3 miles from [facility]) Go .3 miles on [address] Destination is on your right Dillard's at [employer] (6.6 miles from [address]) [address] on [place] toward [address] Turn right onto [address] Continue onto Alcoa Inc. Continue onto Toll Brothers. 5. Elesa Hacker is on right [facility] Conseco) [address] (.6 miles from [address]) [place] on [place]. [address] Turn Left onto [address] 0.3 miles Destination is on the Left. [place] [employer] Decker at American Express (5 miles from Nucor [employer]) 1. Head south on [address]. Turn right onto [address] Turn slightly left onto [employer] Continue onto [employer] Turn right at Barnes & Noble Continue to church on right [place] Sounds of [facility] [address] (1.7 miles  from [place]) [address] on [place] 1.4 miles [place] becomes [place]. [address] Continue 0.6 miles and church will be on theright. [place] [employer] Life Insurance at [address] (2.5 miles from Nucor Corporation) [place] provided from Massachusetts Mutual Life [place]] [place] on [place]. Elm toward Estée Lauder right onto Costco Wholesale left onto Henry Schein left onto Micron Technology [address] Southern Company (2.2 miles from [address]) [address] on [place] 1.4 miles [place] becomes [place]. [place] [address] Turn right onto [address] and church will be on the Left. [place] AT[employer]T [address] (1.2 miles from [facility]) 1.Turn right onto [facility] 2.Turn left onto Rogue Jury 3.Reino [place] 4.Destination is on your right [place]. Melvyn Novas of [address] at [employer]us (1.3 miles from [facility]) [address] on [address] Turn left onto Genuine Parts right onto [address]. [address]. Continue onto KB Home	[place]. Turn left onto [employer]ur[employer]Stone [employer]. Turn right onto WellPoint.

## 2023-09-18 NOTE — Progress Notes (Signed)
   09/18/23 1127  SDOH Interventions  Food Insecurity Interventions Inpatient TOC   Resources placed on AVS

## 2023-09-18 NOTE — Plan of Care (Signed)
  Problem: Clinical Measurements: Goal: Diagnostic test results will improve Outcome: Progressing   Problem: Education: Goal: Knowledge of General Education information will improve Description: Including pain rating scale, medication(s)/side effects and non-pharmacologic comfort measures Outcome: Not Progressing   Problem: Health Behavior/Discharge Planning: Goal: Ability to manage health-related needs will improve Outcome: Not Progressing   Problem: Clinical Measurements: Goal: Ability to maintain clinical measurements within normal limits will improve Outcome: Not Progressing

## 2023-09-21 ENCOUNTER — Other Ambulatory Visit (HOSPITAL_COMMUNITY): Payer: Self-pay

## 2023-09-23 ENCOUNTER — Other Ambulatory Visit (HOSPITAL_COMMUNITY): Payer: Self-pay

## 2023-09-26 ENCOUNTER — Emergency Department (HOSPITAL_COMMUNITY)
Admission: EM | Admit: 2023-09-26 | Discharge: 2023-09-27 | Disposition: A | Discharge status: Home / Self Care | Payer: MEDICAID | Attending: Emergency Medicine | Admitting: Emergency Medicine

## 2023-09-26 ENCOUNTER — Encounter (HOSPITAL_COMMUNITY): Payer: Self-pay

## 2023-09-26 ENCOUNTER — Other Ambulatory Visit: Payer: Self-pay

## 2023-09-26 ENCOUNTER — Emergency Department (HOSPITAL_COMMUNITY): Payer: MEDICAID

## 2023-09-26 DIAGNOSIS — Z79899 Other long term (current) drug therapy: Secondary | ICD-10-CM | POA: Diagnosis not present

## 2023-09-26 DIAGNOSIS — R0789 Other chest pain: Secondary | ICD-10-CM | POA: Insufficient documentation

## 2023-09-26 DIAGNOSIS — E119 Type 2 diabetes mellitus without complications: Secondary | ICD-10-CM | POA: Insufficient documentation

## 2023-09-26 DIAGNOSIS — R519 Headache, unspecified: Secondary | ICD-10-CM | POA: Insufficient documentation

## 2023-09-26 DIAGNOSIS — E876 Hypokalemia: Secondary | ICD-10-CM | POA: Diagnosis not present

## 2023-09-26 DIAGNOSIS — Z91040 Latex allergy status: Secondary | ICD-10-CM | POA: Insufficient documentation

## 2023-09-26 DIAGNOSIS — I10 Essential (primary) hypertension: Secondary | ICD-10-CM | POA: Diagnosis not present

## 2023-09-26 DIAGNOSIS — Z7984 Long term (current) use of oral hypoglycemic drugs: Secondary | ICD-10-CM | POA: Insufficient documentation

## 2023-09-26 LAB — BASIC METABOLIC PANEL WITH GFR
Anion gap: 6 (ref 5–15)
BUN: 13 mg/dL (ref 6–20)
CO2: 27 mmol/L (ref 22–32)
Calcium: 9 mg/dL (ref 8.9–10.3)
Chloride: 106 mmol/L (ref 98–111)
Creatinine, Ser: 0.85 mg/dL (ref 0.44–1.00)
GFR, Estimated: >60 mL/min (ref >60)
Glucose, Bld: 114 mg/dL — ABNORMAL HIGH (ref 70–99)
Potassium: 3.4 mmol/L — ABNORMAL LOW (ref 3.5–5.1)
Sodium: 139 mmol/L (ref 135–145)

## 2023-09-26 LAB — CBC
HCT: 31.3 % — ABNORMAL LOW (ref 36.0–46.0)
Hemoglobin: 8.9 g/dL — ABNORMAL LOW (ref 12.0–15.0)
MCH: 21.7 pg — ABNORMAL LOW (ref 26.0–34.0)
MCHC: 28.4 g/dL — ABNORMAL LOW (ref 30.0–36.0)
MCV: 76.2 fL — ABNORMAL LOW (ref 80.0–100.0)
Platelets: 316 10*3/uL (ref 150–400)
RBC: 4.11 MIL/uL (ref 3.87–5.11)
RDW: 22 % — ABNORMAL HIGH (ref 11.5–15.5)
WBC: 6.3 10*3/uL (ref 4.0–10.5)
nRBC: 0 % (ref 0.0–0.2)

## 2023-09-26 LAB — TROPONIN I (HIGH SENSITIVITY): Troponin I (High Sensitivity): 6 ng/L (ref <18)

## 2023-09-26 NOTE — ED Provider Notes (Signed)
 [place] [facility] Provider Note   CSN: [id number] Arrival date & time: 09/26/23  2206     History  Chief Complaint  Patient presents with   Chest Pain    Kaitlyn Gray is a 48 y.o. female.  The history is provided by the patient and medical records.  Chest Pain  48 year old female with history of adjustment disorder, bipolar disorder, left-sided weakness from prior stroke, hypertension, iron  deficiency anemia, schizoaffective disorder, diabetes, presenting to the ED with chest pain.  Reports this began about 20 mins prior to calling EMS.  States it feels like a "tight squeeze" when it occurs.  States only lasts brief (a few seconds) and then resolves.  Denies SOB, palpitations, dizziness, weakness.  Also has a bit of a headache.  Denies numbness, new weakness, slurred speech, etc. She was give ASA  en route which seemed to help.    Home Medications Prior to Admission medications   Medication Sig Start Date End Date Taking? Authorizing Provider  aspirin  EC 81 MG tablet Take 81 mg by mouth daily. Swallow whole. Patient not taking: Reported on 07/04/2023    [provider]  ferrous sulfate  325 (65 FE) MG tablet Take 1 tablet (325 mg total) by mouth every other day. 09/18/23 03/11/25  Dorthy Gavia, MD  gabapentin  (NEURONTIN ) 100 MG capsule Take 1 capsule (100 mg total) by mouth 3 (three) times daily for pain scale 4-7. 09/18/23 09/28/23  Jonelle Neri, DO  hydrOXYzine  (ATARAX ) 25 MG tablet Take 1 tablet (25 mg total) by mouth 3 (three) times daily as needed for anxiety. Patient not taking: Reported on 07/04/2023 02/12/23   Basilia Bosworth, MD  losartan  (COZAAR ) 100 MG tablet Take 1 tablet (100 mg total) by mouth daily. 09/18/23 12/17/23  Dorthy Gavia, MD  metFORMIN  (GLUCOPHAGE ) 500 MG tablet Take 1 tablet (500 mg total) by mouth 2 (two) times daily with a meal. 09/18/23 12/17/23  Dorthy Gavia, MD  Multiple Vitamin (MULTIVITAMIN WITH  MINERALS) TABS tablet Take 1 tablet by mouth daily. 09/18/23   Dorthy Gavia, MD  nicotine  polacrilex (NICORETTE ) 2 MG gum Take 1 each (2 mg total) by mouth every 2 (two) hours as needed for Smoking Cravings Patient not taking: Reported on 07/04/2023 02/12/23   Basilia Bosworth, MD  QUEtiapine  (SEROQUEL ) 100 MG tablet Take 1 tablet (100 mg total) by mouth at bedtime for 7 days. 07/06/23 07/13/23  Kingsley, Victoria K, DO  rosuvastatin  (CRESTOR ) 40 MG tablet Take 1 tablet (40 mg total) by mouth daily. 09/18/23 12/17/23  Dorthy Gavia, MD      Allergies    Tomato, Hydrocodone , and Latex    Review of Systems   Review of Systems  Cardiovascular:  Positive for chest pain.  All other systems reviewed and are negative.   Physical Exam Updated Vital Signs BP (!) 174/88   Pulse 60   Temp 98 F (36.7 C) (Oral)   Resp (!) 21   SpO2 100%   Physical Exam Vitals and nursing note reviewed.  Constitutional:      General: She is not in acute distress.    Appearance: She is well-developed. She is not diaphoretic.     Comments: Texting on cell phone, NAD  HENT:     Head: Normocephalic and atraumatic.     Right Ear: External ear normal.     Left Ear: External ear normal.  Eyes:     Conjunctiva/sclera: Conjunctivae normal.     Pupils:  Pupils are equal, round, and reactive to light.  Neck:     Comments: No rigidity, no meningismus Cardiovascular:     Rate and Rhythm: Normal rate and regular rhythm.     Heart sounds: Normal heart sounds. No murmur heard. Pulmonary:     Effort: Pulmonary effort is normal. No respiratory distress.     Breath sounds: Normal breath sounds. No wheezing or rhonchi.  Abdominal:     General: Bowel sounds are normal.     Palpations: Abdomen is soft.     Tenderness: There is no abdominal tenderness. There is no guarding.  Musculoskeletal:        General: Normal range of motion.     Cervical back: Full passive range of motion without pain, normal range of motion and  neck supple. No rigidity.  Skin:    General: Skin is warm and dry.     Findings: No rash.  Neurological:     Mental Status: She is alert and oriented to person, place, and time.     Cranial Nerves: No cranial nerve deficit.     Sensory: No sensory deficit.     Motor: No tremor or seizure activity.     Comments: AAOx3, answering questions and following commands appropriately; equal strength UE and LE bilaterally; CN grossly intact; moves all extremities appropriately without ataxia; no focal neuro deficits or facial asymmetry appreciated  Psychiatric:        Behavior: Behavior normal.        Thought Content: Thought content normal.     ED Results / Procedures / Treatments   Labs (all labs ordered are listed, but only abnormal results are displayed) Labs Reviewed  BASIC METABOLIC PANEL WITH GFR - Abnormal; Notable for the following components:      Result Value   Potassium 3.4 (*)    Glucose, Bld 114 (*)    All other components within normal limits  CBC - Abnormal; Notable for the following components:   Hemoglobin 8.9 (*)    HCT 31.3 (*)    MCV 76.2 (*)    MCH 21.7 (*)    MCHC 28.4 (*)    RDW 22.0 (*)    All other components within normal limits  TROPONIN I (HIGH SENSITIVITY)  TROPONIN I (HIGH SENSITIVITY)    EKG EKG Interpretation Date/Time:  Saturday September 26 2023 22:11:45 EDT Ventricular Rate:  59 PR Interval:  158 QRS Duration:  96 QT Interval:  448 QTC Calculation: 444 R Axis:   33  Text Interpretation: Sinus rhythm Probable left atrial enlargement Borderline T abnormalities, anterior leads No significant change was found Confirmed by Earma Gloss [id number]) on 09/27/2023 2:30:00 AM  Radiology DG Chest 2 View Result Date: 09/26/2023 CLINICAL DATA:  Chest pain EXAM: CHEST - 2 VIEW COMPARISON:  Chest x-ray 09/17/2023 FINDINGS: The heart is enlarged, unchanged. The lungs are clear. There is no pleural effusion or pneumothorax. No acute fractures are seen.  IMPRESSION: 1. No active cardiopulmonary disease. 2. Cardiomegaly. Electronically Signed   By: Tyron Gallon M.D.   On: 09/26/2023 22:58    Procedures Procedures    Medications Ordered in ED Medications - No data to display  ED Course/ Medical Decision Making/ A&P                                 Medical Decision Making Amount and/or Complexity of Data Reviewed Labs: ordered. Radiology: ordered and independent  interpretation performed. ECG/medicine tests: ordered and independent interpretation performed.   48 year old female presenting to the ED with chest pain.  Began approximately 20 minutes prior to calling EMS.  Also has a generalized headache.  She is awake, alert, oriented here.  She has no focal neurologic deficits.  She is texting and watching videos on multiple cell phones, no acute distress.  Her EKG is nonischemic.  Labs are grossly reassuring without leukocytosis.  Potassium is trivially low at 3.4.  Troponin negative x 2.  Chest x-ray is clear.  Her symptoms are quite atypical.  No tachycardia/hypoxia.  Doubt ACS, PE, dissection, or other acute cardiac event.  She has been eating and drinking here without issue.  Remains without any neurologic deficits.  Feel she is stable for discharge.  I recommended that she follow-up closely with her primary care doctor.  Can return here for new concerns.  Final Clinical Impression(s) / ED Diagnoses Final diagnoses:  Atypical chest pain    Rx / DC Orders ED Discharge Orders     None         Coretha Dew, PA-C 09/27/23 0232    Mozell Arias, MD 10/03/23 1341

## 2023-09-26 NOTE — ED Triage Notes (Signed)
 Patient coming from home. Chief complaint of chest pain and head ache that started around 20 minutes EMS was called. Chest pain is centralized and nonradiating and headache is generalized. EMS gave 324mg  of aspirin. Hx of strokes EMS VS 180/100 BP 66 HR 100% RA 126 CBG

## 2023-09-27 LAB — TROPONIN I (HIGH SENSITIVITY): Troponin I (High Sensitivity): 7 ng/L (ref <18)

## 2023-09-27 NOTE — Discharge Instructions (Signed)
 Cardiac work-up today is reassuring. Please follow-up with your primary care doctor. Return here for new concerns.

## 2023-09-28 ENCOUNTER — Encounter (HOSPITAL_COMMUNITY): Payer: Self-pay | Admitting: *Deleted

## 2023-09-28 ENCOUNTER — Emergency Department (HOSPITAL_COMMUNITY): Payer: MEDICAID

## 2023-09-28 ENCOUNTER — Other Ambulatory Visit: Payer: Self-pay

## 2023-09-28 ENCOUNTER — Emergency Department (HOSPITAL_COMMUNITY)
Admission: EM | Admit: 2023-09-28 | Discharge: 2023-09-28 | Disposition: A | Discharge status: Home / Self Care | Payer: MEDICAID | Attending: Emergency Medicine | Admitting: Emergency Medicine

## 2023-09-28 DIAGNOSIS — Z7982 Long term (current) use of aspirin: Secondary | ICD-10-CM | POA: Diagnosis not present

## 2023-09-28 DIAGNOSIS — R079 Chest pain, unspecified: Secondary | ICD-10-CM | POA: Diagnosis present

## 2023-09-28 LAB — URINALYSIS, ROUTINE W REFLEX MICROSCOPIC
Bilirubin Urine: NEGATIVE
Glucose, UA: NEGATIVE mg/dL
Hgb urine dipstick: NEGATIVE
Ketones, ur: NEGATIVE mg/dL
Leukocytes,Ua: NEGATIVE
Nitrite: NEGATIVE
Protein, ur: NEGATIVE mg/dL
Specific Gravity, Urine: 1.038 — ABNORMAL HIGH (ref 1.005–1.030)
pH: 7 (ref 5.0–8.0)

## 2023-09-28 LAB — CBC WITH DIFFERENTIAL/PLATELET
Abs Immature Granulocytes: 0.01 10*3/uL (ref 0.00–0.07)
Basophils Absolute: 0 10*3/uL (ref 0.0–0.1)
Basophils Relative: 1 %
Eosinophils Absolute: 0.2 10*3/uL (ref 0.0–0.5)
Eosinophils Relative: 4 %
HCT: 30.4 % — ABNORMAL LOW (ref 36.0–46.0)
Hemoglobin: 8.5 g/dL — ABNORMAL LOW (ref 12.0–15.0)
Immature Granulocytes: 0 %
Lymphocytes Relative: 30 %
Lymphs Abs: 1.5 10*3/uL (ref 0.7–4.0)
MCH: 21.7 pg — ABNORMAL LOW (ref 26.0–34.0)
MCHC: 28 g/dL — ABNORMAL LOW (ref 30.0–36.0)
MCV: 77.7 fL — ABNORMAL LOW (ref 80.0–100.0)
Monocytes Absolute: 0.5 10*3/uL (ref 0.1–1.0)
Monocytes Relative: 9 %
Neutro Abs: 2.9 10*3/uL (ref 1.7–7.7)
Neutrophils Relative %: 56 %
Platelets: 276 10*3/uL (ref 150–400)
RBC: 3.91 MIL/uL (ref 3.87–5.11)
RDW: 21.8 % — ABNORMAL HIGH (ref 11.5–15.5)
WBC: 5.1 10*3/uL (ref 4.0–10.5)
nRBC: 0 % (ref 0.0–0.2)

## 2023-09-28 LAB — COMPREHENSIVE METABOLIC PANEL WITH GFR
ALT: 12 U/L (ref 0–44)
AST: 13 U/L — ABNORMAL LOW (ref 15–41)
Albumin: 3.1 g/dL — ABNORMAL LOW (ref 3.5–5.0)
Alkaline Phosphatase: 54 U/L (ref 38–126)
Anion gap: 7 (ref 5–15)
BUN: 15 mg/dL (ref 6–20)
CO2: 28 mmol/L (ref 22–32)
Calcium: 8.5 mg/dL — ABNORMAL LOW (ref 8.9–10.3)
Chloride: 107 mmol/L (ref 98–111)
Creatinine, Ser: 0.85 mg/dL (ref 0.44–1.00)
GFR, Estimated: >60 mL/min (ref >60)
Glucose, Bld: 99 mg/dL (ref 70–99)
Potassium: 3.5 mmol/L (ref 3.5–5.1)
Sodium: 142 mmol/L (ref 135–145)
Total Bilirubin: 0.4 mg/dL (ref 0.0–1.2)
Total Protein: 6.6 g/dL (ref 6.5–8.1)

## 2023-09-28 LAB — PROTIME-INR
INR: 1 (ref 0.8–1.2)
Prothrombin Time: 13.4 s (ref 11.4–15.2)

## 2023-09-28 LAB — RAPID URINE DRUG SCREEN, HOSP PERFORMED
Amphetamines: NOT DETECTED
Barbiturates: NOT DETECTED
Benzodiazepines: NOT DETECTED
Cocaine: POSITIVE — AB
Opiates: NOT DETECTED
Tetrahydrocannabinol: NOT DETECTED

## 2023-09-28 LAB — BRAIN NATRIURETIC PEPTIDE: B Natriuretic Peptide: 55.5 pg/mL (ref 0.0–100.0)

## 2023-09-28 LAB — LIPASE, BLOOD: Lipase: 50 U/L (ref 11–51)

## 2023-09-28 LAB — TROPONIN I (HIGH SENSITIVITY)
Troponin I (High Sensitivity): 5 ng/L (ref <18)
Troponin I (High Sensitivity): 5 ng/L (ref <18)

## 2023-09-28 MED ORDER — IOHEXOL 350 MG/ML SOLN
75.0000 mL | Freq: Once | INTRAVENOUS | Status: AC | PRN
  Administered 2023-09-28: 75 mL via INTRAVENOUS

## 2023-09-28 NOTE — ED Triage Notes (Signed)
 BIB EMS with chest, back and neck pain, seen at [facility] 2 days ago for same. Pt presents as a STEMI via EMS protocol. 20 minutes of pain today,

## 2023-09-28 NOTE — ED Provider Notes (Signed)
  Physical Exam  BP (!) 160/89   Pulse (!) 56   Temp 98.4 F (36.9 C) (Oral)   Resp 15   Ht 5\' 5"  (1.651 m)   Wt 77 kg   SpO2 100%   BMI 28.25 kg/m   Physical Exam  Procedures  Procedures  ED Course / MDM    Medical Decision Making Amount and/or Complexity of Data Reviewed Labs: ordered. Radiology: ordered.   *** Chest pain  PE study

## 2023-09-28 NOTE — ED Notes (Signed)
EKG given to Dr. Pfeiffer 

## 2023-09-28 NOTE — ED Provider Notes (Signed)
 [place] [facility] AT [facility] Provider Note   CSN: [id number] Arrival date & time: 09/28/23  1403     History {Add pertinent medical, surgical, social history, OB history to HPI:1} Chief Complaint  Patient presents with   Chest Pain    Jonetta SHERYL SAINTIL is a 48 y.o. female.  HPI Patient reports she has chest pain that started about 20 minutes prior to arrival.  She indicates pain is in her left upper chest and goes through to her back.  Patient denies nausea or syncope.  No significant shortness of breath.  Patient reports she has similar pain and was seen in the emergency department 2 days ago but subsequently discharged.  EMS obtained tracing with concern for possible STEMI with V1 and V2 elevation.  The patient refused transfer to [facility] facility due to being seen there previously per her report.  Thus patient ended up being brought to [facility] despite [facility]G with possible STEMI.    Home Medications Prior to Admission medications   Medication Sig Start Date End Date Taking? Authorizing Provider  aspirin EC 81 MG tablet Take 81 mg by mouth daily. Swallow whole. Patient not taking: Reported on 07/04/2023    [provider]  ferrous sulfate 325 (65 FE) MG tablet Take 1 tablet (325 mg total) by mouth every other day. 09/18/23 03/11/25  Annett Fabian, MD  gabapentin (NEURONTIN) 100 MG capsule Take 1 capsule (100 mg total) by mouth 3 (three) times daily for pain scale 4-7. 09/18/23 09/28/23  Modena Slater, DO  hydrOXYzine (ATARAX) 25 MG tablet Take 1 tablet (25 mg total) by mouth 3 (three) times daily as needed for anxiety. Patient not taking: Reported on 07/04/2023 02/12/23   Lauro Franklin, MD  losartan (COZAAR) 100 MG tablet Take 1 tablet (100 mg total) by mouth daily. 09/18/23 12/17/23  Annett Fabian, MD  metFORMIN (GLUCOPHAGE) 500 MG tablet Take 1 tablet (500 mg total) by mouth 2 (two) times daily with a meal. 09/18/23 12/17/23  Annett Fabian, MD   Multiple Vitamin (MULTIVITAMIN WITH MINERALS) TABS tablet Take 1 tablet by mouth daily. 09/18/23   Annett Fabian, MD  nicotine polacrilex (NICORETTE) 2 MG gum Take 1 each (2 mg total) by mouth every 2 (two) hours as needed for Smoking Cravings Patient not taking: Reported on 07/04/2023 02/12/23   Lauro Franklin, MD  QUEtiapine (SEROQUEL) 100 MG tablet Take 1 tablet (100 mg total) by mouth at bedtime for 7 days. 07/06/23 07/13/23  Elayne Snare K, DO  rosuvastatin (CRESTOR) 40 MG tablet Take 1 tablet (40 mg total) by mouth daily. 09/18/23 12/17/23  Annett Fabian, MD      Allergies    Tomato, Hydrocodone, and Latex    Review of Systems   Review of Systems  Physical Exam Updated Vital Signs BP (!) 152/94 (BP Location: Left Arm)   Pulse 66   Temp 98.4 F (36.9 C) (Oral)   Resp (!) 22   Ht 5\' 5"  (1.651 m)   Wt 77 kg   SpO2 99%   BMI 28.25 kg/m  Physical Exam Constitutional:      Comments: Patient is alert nontoxic.  No acute distress.  HENT:     Head: Normocephalic and atraumatic.     Mouth/Throat:     Pharynx: Oropharynx is clear.  Cardiovascular:     Rate and Rhythm: Normal rate and regular rhythm.     Heart sounds: Normal heart sounds.  Pulmonary:  Effort: Pulmonary effort is normal.     Breath sounds: Normal breath sounds.  Abdominal:     General: There is no distension.     Palpations: Abdomen is soft.     Tenderness: There is no abdominal tenderness. There is no guarding.  Musculoskeletal:        General: No swelling or tenderness. Normal range of motion.     Right lower leg: No edema.     Left lower leg: No edema.  Skin:    General: Skin is warm and dry.  Neurological:     General: No focal deficit present.     Mental Status: She is oriented to person, place, and time.     ED Results / Procedures / Treatments   Labs (all labs ordered are listed, but only abnormal results are displayed) Labs Reviewed  COMPREHENSIVE METABOLIC PANEL WITH GFR   LIPASE, BLOOD  BRAIN NATRIURETIC PEPTIDE  CBC WITH DIFFERENTIAL/PLATELET  PROTIME-INR  URINALYSIS, ROUTINE W REFLEX MICROSCOPIC  TROPONIN I (HIGH SENSITIVITY)    EKG None  Radiology DG Chest 2 View Result Date: 09/26/2023 CLINICAL DATA:  Chest pain EXAM: CHEST - 2 VIEW COMPARISON:  Chest x-ray 09/17/2023 FINDINGS: The heart is enlarged, unchanged. The lungs are clear. There is no pleural effusion or pneumothorax. No acute fractures are seen. IMPRESSION: 1. No active cardiopulmonary disease. 2. Cardiomegaly. Electronically Signed   By: Tyron Gallon M.D.   On: 09/26/2023 22:58    Procedures Procedures  {Document cardiac monitor, telemetry assessment procedure when appropriate:1}  Medications Ordered in ED Medications - No data to display  ED Course/ Medical Decision Making/ A&P   {   Click here for ABCD2, HEART and other calculatorsREFRESH Note before signing :1}                              Medical Decision Making Amount and/or Complexity of Data Reviewed Labs: ordered. Radiology: ordered.   Patient presents as outlined with chest pain.  She had been seen yesterday and had troponin stable as of 2 AM.  EMS EKG suggested possible anterior elevations which triggered code STEMI.  At this time however it appears EKG is similar to the prior tracing.  Patient refused transfer to [facility].  Will proceed with cardiac workup and repeat troponin.  Patient was given nitro and aspirin en route.  14: 30 reviewed tracings and history with Dr. Lavonne Prairie.  At this time appears most consistent with early repolarization.  Does not meet ST elevation MI criteria.  Will continue diagnostic evaluation and workup.  {Document critical care time when appropriate:1} {Document review of labs and clinical decision tools ie heart score, Chads2Vasc2 etc:1}  {Document your independent review of radiology images, and any outside records:1} {Document your discussion with family members, caretakers, and  with consultants:1} {Document social determinants of health affecting pt's care:1} {Document your decision making why or why not admission, treatments were needed:1} Final Clinical Impression(s) / ED Diagnoses Final diagnoses:  None    Rx / DC Orders ED Discharge Orders     None

## 2023-10-03 ENCOUNTER — Other Ambulatory Visit (HOSPITAL_COMMUNITY): Payer: Self-pay

## 2023-10-20 ENCOUNTER — Emergency Department (HOSPITAL_COMMUNITY)
Admission: EM | Admit: 2023-10-20 | Discharge: 2023-10-21 | Disposition: A | Discharge status: Other Acute Inpatient Hospital | Payer: MEDICAID | Attending: Emergency Medicine | Admitting: Emergency Medicine

## 2023-10-20 ENCOUNTER — Emergency Department (HOSPITAL_COMMUNITY): Payer: MEDICAID

## 2023-10-20 ENCOUNTER — Encounter (HOSPITAL_COMMUNITY): Payer: Self-pay | Admitting: Emergency Medicine

## 2023-10-20 DIAGNOSIS — Z79899 Other long term (current) drug therapy: Secondary | ICD-10-CM | POA: Diagnosis not present

## 2023-10-20 DIAGNOSIS — R45851 Suicidal ideations: Secondary | ICD-10-CM | POA: Diagnosis not present

## 2023-10-20 DIAGNOSIS — E876 Hypokalemia: Secondary | ICD-10-CM | POA: Insufficient documentation

## 2023-10-20 DIAGNOSIS — R079 Chest pain, unspecified: Secondary | ICD-10-CM | POA: Diagnosis not present

## 2023-10-20 DIAGNOSIS — Z91040 Latex allergy status: Secondary | ICD-10-CM | POA: Insufficient documentation

## 2023-10-20 DIAGNOSIS — I10 Essential (primary) hypertension: Secondary | ICD-10-CM | POA: Diagnosis not present

## 2023-10-20 DIAGNOSIS — E1165 Type 2 diabetes mellitus with hyperglycemia: Secondary | ICD-10-CM | POA: Diagnosis not present

## 2023-10-20 DIAGNOSIS — Z8673 Personal history of transient ischemic attack (TIA), and cerebral infarction without residual deficits: Secondary | ICD-10-CM | POA: Insufficient documentation

## 2023-10-20 DIAGNOSIS — Z7982 Long term (current) use of aspirin: Secondary | ICD-10-CM | POA: Diagnosis not present

## 2023-10-20 DIAGNOSIS — Z7984 Long term (current) use of oral hypoglycemic drugs: Secondary | ICD-10-CM | POA: Insufficient documentation

## 2023-10-20 DIAGNOSIS — M542 Cervicalgia: Secondary | ICD-10-CM | POA: Insufficient documentation

## 2023-10-20 LAB — BASIC METABOLIC PANEL WITH GFR
Anion gap: 10 (ref 5–15)
BUN: 8 mg/dL (ref 6–20)
CO2: 26 mmol/L (ref 22–32)
Calcium: 9.1 mg/dL (ref 8.9–10.3)
Chloride: 103 mmol/L (ref 98–111)
Creatinine, Ser: 0.94 mg/dL (ref 0.44–1.00)
GFR, Estimated: >60 mL/min (ref >60)
Glucose, Bld: 216 mg/dL — ABNORMAL HIGH (ref 70–99)
Potassium: 2.9 mmol/L — ABNORMAL LOW (ref 3.5–5.1)
Sodium: 139 mmol/L (ref 135–145)

## 2023-10-20 LAB — TROPONIN I (HIGH SENSITIVITY)
Troponin I (High Sensitivity): 7 ng/L (ref <18)
Troponin I (High Sensitivity): 7 ng/L (ref <18)

## 2023-10-20 LAB — HEPATIC FUNCTION PANEL
ALT: 11 U/L (ref 0–44)
AST: 15 U/L (ref 15–41)
Albumin: 3.3 g/dL — ABNORMAL LOW (ref 3.5–5.0)
Alkaline Phosphatase: 45 U/L (ref 38–126)
Bilirubin, Direct: <0.1 mg/dL (ref 0.0–0.2)
Total Bilirubin: 0.5 mg/dL (ref 0.0–1.2)
Total Protein: 6.8 g/dL (ref 6.5–8.1)

## 2023-10-20 LAB — RESP PANEL BY RT-PCR (RSV, FLU A&B, COVID)  RVPGX2
Influenza A by PCR: NEGATIVE
Influenza B by PCR: NEGATIVE
Resp Syncytial Virus by PCR: NEGATIVE
SARS Coronavirus 2 by RT PCR: NEGATIVE

## 2023-10-20 LAB — ETHANOL: Alcohol, Ethyl (B): <15 mg/dL (ref <15)

## 2023-10-20 LAB — CBC
HCT: 32.1 % — ABNORMAL LOW (ref 36.0–46.0)
Hemoglobin: 9.4 g/dL — ABNORMAL LOW (ref 12.0–15.0)
MCH: 22.7 pg — ABNORMAL LOW (ref 26.0–34.0)
MCHC: 29.3 g/dL — ABNORMAL LOW (ref 30.0–36.0)
MCV: 77.3 fL — ABNORMAL LOW (ref 80.0–100.0)
Platelets: 331 10*3/uL (ref 150–400)
RBC: 4.15 MIL/uL (ref 3.87–5.11)
RDW: 21.8 % — ABNORMAL HIGH (ref 11.5–15.5)
WBC: 4.2 10*3/uL (ref 4.0–10.5)
nRBC: 0 % (ref 0.0–0.2)

## 2023-10-20 LAB — HCG, SERUM, QUALITATIVE: Preg, Serum: NEGATIVE

## 2023-10-20 LAB — MAGNESIUM: Magnesium: 1.7 mg/dL (ref 1.7–2.4)

## 2023-10-20 LAB — CK: Total CK: 70 U/L (ref 38–234)

## 2023-10-20 MED ORDER — POTASSIUM CHLORIDE CRYS ER 20 MEQ PO TBCR
40.0000 meq | EXTENDED_RELEASE_TABLET | Freq: Once | ORAL | Status: AC
  Administered 2023-10-20: 40 meq via ORAL
  Filled 2023-10-20: qty 2

## 2023-10-20 MED ORDER — GABAPENTIN 100 MG PO CAPS
100.0000 mg | ORAL_CAPSULE | Freq: Three times a day (TID) | ORAL | Status: DC
  Administered 2023-10-20 – 2023-10-21 (×3): 100 mg via ORAL
  Filled 2023-10-20 (×3): qty 1

## 2023-10-20 MED ORDER — ROSUVASTATIN CALCIUM 5 MG PO TABS
40.0000 mg | ORAL_TABLET | Freq: Every day | ORAL | Status: DC
  Administered 2023-10-20 – 2023-10-21 (×2): 40 mg via ORAL
  Filled 2023-10-20: qty 8
  Filled 2023-10-20: qty 2

## 2023-10-20 MED ORDER — METFORMIN HCL 500 MG PO TABS
500.0000 mg | ORAL_TABLET | Freq: Two times a day (BID) | ORAL | Status: DC
  Administered 2023-10-21: 500 mg via ORAL
  Filled 2023-10-20: qty 1

## 2023-10-20 MED ORDER — ACETAMINOPHEN 325 MG PO TABS
650.0000 mg | ORAL_TABLET | Freq: Once | ORAL | Status: AC
  Administered 2023-10-20: 650 mg via ORAL
  Filled 2023-10-20: qty 2

## 2023-10-20 MED ORDER — ADULT MULTIVITAMIN W/MINERALS CH
1.0000 | ORAL_TABLET | Freq: Every day | ORAL | Status: DC
  Administered 2023-10-20 – 2023-10-21 (×2): 1 via ORAL
  Filled 2023-10-20 (×2): qty 1

## 2023-10-20 MED ORDER — LOSARTAN POTASSIUM 50 MG PO TABS
100.0000 mg | ORAL_TABLET | Freq: Every day | ORAL | Status: DC
  Administered 2023-10-20 – 2023-10-21 (×2): 100 mg via ORAL
  Filled 2023-10-20 (×2): qty 2

## 2023-10-20 MED ORDER — FERROUS SULFATE 325 (65 FE) MG PO TABS
325.0000 mg | ORAL_TABLET | ORAL | Status: DC
  Administered 2023-10-20: 325 mg via ORAL
  Filled 2023-10-20: qty 1

## 2023-10-20 NOTE — ED Provider Notes (Signed)
 [place] [facility] DEPART[facility] AT [facility] Provider Note   CSN: [id number] Arrival date & time: 10/20/23  1519     History  Chief Complaint  Patient presents with   Generalized Body Aches    Kaitlyn Gray is a 48 y.o. female.  Patient is a 48 year old female with a past medical history of hypertension, diabetes, prior CVA, schizoaffective, polysubstance use presenting to the emergency department with neck pain as well as suicidal ideation.  The patient states that last night she was having some anterior neck and upper chest pain.  She states that she feels like she needs to cough but is unable to cough. Denies any difficulty swallowing.  Denies any shortness of breath.  Denies any congestion or sore throat.  Patient states that she has also been feeling suicidal.  She states that she would overdose on her pills.  The history is provided by the patient.       Home Medications Prior to Admission medications   Medication Sig Start Date End Date Taking? Authorizing Provider  aspirin  EC 81 MG tablet Take 81 mg by mouth daily. Swallow whole. Patient not taking: Reported on 07/04/2023    [provider]  ferrous sulfate  325 (65 FE) MG tablet Take 1 tablet (325 mg total) by mouth every other day. 09/18/23 03/11/25  Dorthy Gavia, MD  gabapentin  (NEURONTIN ) 100 MG capsule Take 1 capsule (100 mg total) by mouth 3 (three) times daily for pain scale 4-7. 09/18/23 09/28/23  Jonelle Neri, DO  hydrOXYzine  (ATARAX ) 25 MG tablet Take 1 tablet (25 mg total) by mouth 3 (three) times daily as needed for anxiety. Patient not taking: Reported on 07/04/2023 02/12/23   Basilia Bosworth, MD  losartan  (COZAAR ) 100 MG tablet Take 1 tablet (100 mg total) by mouth daily. 09/18/23 12/17/23  Dorthy Gavia, MD  metFORMIN  (GLUCOPHAGE ) 500 MG tablet Take 1 tablet (500 mg total) by mouth 2 (two) times daily with a meal. 09/18/23 12/17/23  Dorthy Gavia, MD  Multiple Vitamin (MULTIVITAMIN WITH  MINERALS) TABS tablet Take 1 tablet by mouth daily. 09/18/23   Dorthy Gavia, MD  nicotine  polacrilex (NICORETTE ) 2 MG gum Take 1 each (2 mg total) by mouth every 2 (two) hours as needed for Smoking Cravings Patient not taking: Reported on 07/04/2023 02/12/23   Basilia Bosworth, MD  QUEtiapine  (SEROQUEL ) 100 MG tablet Take 1 tablet (100 mg total) by mouth at bedtime for 7 days. 07/06/23 07/13/23  Kingsley, Zanobia Griebel K, DO  rosuvastatin  (CRESTOR ) 40 MG tablet Take 1 tablet (40 mg total) by mouth daily. 09/18/23 12/17/23  Dorthy Gavia, MD      Allergies    Tomato, Hydrocodone , and Latex    Review of Systems   Review of Systems  Physical Exam Updated Vital Signs BP (!) 148/84   Pulse 63   Temp 98.1 F (36.7 C) (Oral)   Resp 18   SpO2 100%  Physical Exam Vitals and nursing note reviewed.  Constitutional:      General: She is not in acute distress.    Appearance: Normal appearance.  HENT:     Head: Normocephalic and atraumatic.     Nose: Nose normal.     Mouth/Throat:     Mouth: Mucous membranes are moist.     Pharynx: Oropharynx is clear. No oropharyngeal exudate or posterior oropharyngeal erythema.     Comments: Uvula midline, normal phonation, no trismus, tolerating secretions Eyes:     Extraocular Movements: Extraocular movements intact.  Conjunctiva/sclera: Conjunctivae normal.  Neck:     Vascular: No carotid bruit.     Comments: No palpable thyromegaly  Cardiovascular:     Rate and Rhythm: Normal rate and regular rhythm.     Heart sounds: Normal heart sounds.  Pulmonary:     Effort: Pulmonary effort is normal.     Breath sounds: Normal breath sounds. No stridor.  Abdominal:     General: Abdomen is flat.     Palpations: Abdomen is soft.     Tenderness: There is no abdominal tenderness.  Musculoskeletal:        General: Normal range of motion.     Cervical back: Normal range of motion and neck supple. No tenderness.  Lymphadenopathy:     Cervical: No cervical  adenopathy.  Skin:    General: Skin is warm and dry.  Neurological:     Mental Status: She is alert and oriented to person, place, and time.  Psychiatric:        Mood and Affect: Mood normal.        Behavior: Behavior normal.     ED Results / Procedures / Treatments   Labs (all labs ordered are listed, but only abnormal results are displayed) Labs Reviewed  BASIC METABOLIC PANEL WITH GFR - Abnormal; Notable for the following components:      Result Value   Potassium 2.9 (*)    Glucose, Bld 216 (*)    All other components within normal limits  CBC - Abnormal; Notable for the following components:   Hemoglobin 9.4 (*)    HCT 32.1 (*)    MCV 77.3 (*)    MCH 22.7 (*)    MCHC 29.3 (*)    RDW 21.8 (*)    All other components within normal limits  HEPATIC FUNCTION PANEL - Abnormal; Notable for the following components:   Albumin 3.3 (*)    All other components within normal limits  RESP PANEL BY RT-PCR (RSV, FLU A&B, COVID)  RVPGX2  HCG, SERUM, QUALITATIVE  ETHANOL  CK  MAGNESIUM   RAPID URINE DRUG SCREEN, HOSP PERFORMED  TROPONIN I (HIGH SENSITIVITY)  TROPONIN I (HIGH SENSITIVITY)    EKG None  Radiology DG Chest 2 View Result Date: 10/20/2023 CLINICAL DATA:  Chest pain. EXAM: CHEST - 2 VIEW COMPARISON:  September 28, 2023. FINDINGS: Stable cardiomediastinal silhouette. Both lungs are clear. The visualized skeletal structures are unremarkable. IMPRESSION: No active cardiopulmonary disease. Electronically Signed   By: Rosalene Colon M.D.   On: 10/20/2023 16:33    Procedures Procedures    Medications Ordered in ED Medications  ferrous sulfate  tablet 325 mg (has no administration in time range)  gabapentin  (NEURONTIN ) capsule 100 mg (has no administration in time range)  losartan  (COZAAR ) tablet 100 mg (has no administration in time range)  metFORMIN  (GLUCOPHAGE ) tablet 500 mg (has no administration in time range)  multivitamin with minerals tablet 1 tablet (has no  administration in time range)  rosuvastatin  (CRESTOR ) tablet 40 mg (has no administration in time range)  potassium chloride  SA (KLOR-CON  M) CR tablet 40 mEq (40 mEq Oral Given 10/20/23 1703)  acetaminophen  (TYLENOL ) tablet 650 mg (650 mg Oral Given 10/20/23 1703)    ED Course/ Medical Decision Making/ A&P Clinical Course as of 10/20/23 1915  Tue Oct 20, 2023  1633 Hgb at baseline, mildly low potassium will be repleted. Mild hyperglycemia without evidence of DKA. [VK]  1636 Troponin negative. Symptoms ongoing since last night, single troponin is sufficient.  [  VK]  1819 Remainder of labs within normal range, CXR without acute disease. Repeat troponin was sent prior to discontinuing and this is pending. [VK]  1854 Repeat troponin negative, patient is medically cleared for psych eval. [VK]    Clinical Course User Index [VK] Kingsley, Franck Vinal K, DO                                 Medical Decision Making This patient presents to the ED with chief complaint(s) of neck pain/chest pain, SI with pertinent past medical history of HTN, DM, prior CVA, schizoaffective, polysubstance use which further complicates the presenting complaint. The complaint involves an extensive differential diagnosis and also carries with it a high risk of complications and morbidity.    The differential diagnosis includes ACS, arrhythmia, anemia, pneumonia, pneumothorax, pulmonary edema, pleural effusion, viral syndrome, no evidence of PTA, RPA or other deep space infection, no stridor or signs of airway compromise, no evidence of thyromegaly  Additional history obtained: Additional history obtained from N/A Records reviewed previous admission documents  ED Course and Reassessment: On patient's arrival she is hemodynamically stable in no acute distress.  Was initially evaluated by provider in triage and had EKG and labs performed.  Labs are pending at this time.  Does endorse SI to me with a plan to overdose on pills.  Is  willing to be voluntary for psychiatry evaluation.  Will be placed on suicidal precautions.  Will be given Tylenol  for pain control and will be closely reassessed.  Independent labs interpretation:  The following labs were independently interpreted: mild hypokalemia otherwise at baseline  Independent visualization of imaging: - I independently visualized the following imaging with scope of interpretation limited to determining acute life threatening conditions related to emergency care: CXR, which revealed no acute disease  Consultation: - Consulted or discussed management/test interpretation w/ external professional: TTS   Amount and/or Complexity of Data Reviewed Labs: ordered. Radiology: ordered.  Risk OTC drugs. Prescription drug management.          Final Clinical Impression(s) / ED Diagnoses Final diagnoses:  Anterior neck pain  Suicidal ideation    Rx / DC Orders ED Discharge Orders     None         Kingsley, Savaya Hakes K, DO 10/20/23 1915

## 2023-10-20 NOTE — ED Triage Notes (Signed)
 Pt denies SI but states she wants to see a psychiatrist to discuss her medications.

## 2023-10-20 NOTE — BH Assessment (Signed)
 TTS consult will be completed by IRIS. IRIS Coordinator will communicate in this secure chat assessment time and provider name. Thanks

## 2023-10-20 NOTE — ED Triage Notes (Signed)
 PT BIB EMS from home with complaints of all over body pain including back, neck and chest. Started at 4am today.

## 2023-10-20 NOTE — ED Notes (Signed)
 Pt dressed out and Kaitlyn Gray, Charity fundraiser to call for wanding and valuables pick up from security. Pt calm and cooperative at this time. Settled in to room 50 with sitter at bedside.

## 2023-10-20 NOTE — ED Notes (Signed)
Patient has been wanded by security-Monique,RN

## 2023-10-20 NOTE — ED Provider Triage Note (Signed)
 Emergency Medicine Provider Triage Evaluation Note  Kaitlyn Gray , a 48 y.o. female  was evaluated in triage.  Pt complains of chest pain drug use and suicidal thoughts..   Physical Exam  BP (!) 148/84   Pulse 63   Temp 98.1 F (36.7 C) (Oral)   Resp 18   SpO2 100%  Clear breath sounds.  Medical Decision Making  Medically screening exam initiated at 3:48 PM.  Appropriate orders placed.  Kaitlyn Gray was informed that the remainder of the evaluation will be completed by another provider, this initial triage assessment does not replace that evaluation, and the importance of remaining in the ED until their evaluation is complete.  Patient she has chest pain and pain in her neck.  Also states she needs to see a psychiatrist because she has fibromyalgia and is out of medicines.  Also states she has been doing a lot of drugs and is suicidal.   Kaitlyn Arias, MD 10/20/23 [phone]

## 2023-10-21 ENCOUNTER — Inpatient Hospital Stay (HOSPITAL_COMMUNITY)
Admission: AD | Admit: 2023-10-21 | Discharge: 2023-10-26 | DRG: 885 | Disposition: A | Discharge status: Home / Self Care | Payer: MEDICAID | Source: Intra-hospital | Attending: Psychiatry | Admitting: Psychiatry

## 2023-10-21 ENCOUNTER — Encounter (HOSPITAL_COMMUNITY): Payer: Self-pay | Admitting: Psychiatry

## 2023-10-21 ENCOUNTER — Other Ambulatory Visit: Payer: Self-pay

## 2023-10-21 DIAGNOSIS — E119 Type 2 diabetes mellitus without complications: Secondary | ICD-10-CM | POA: Diagnosis present

## 2023-10-21 DIAGNOSIS — F17290 Nicotine dependence, other tobacco product, uncomplicated: Secondary | ICD-10-CM | POA: Diagnosis present

## 2023-10-21 DIAGNOSIS — I10 Essential (primary) hypertension: Secondary | ICD-10-CM | POA: Diagnosis present

## 2023-10-21 DIAGNOSIS — Z79899 Other long term (current) drug therapy: Secondary | ICD-10-CM

## 2023-10-21 DIAGNOSIS — I69322 Dysarthria following cerebral infarction: Secondary | ICD-10-CM

## 2023-10-21 DIAGNOSIS — Z5941 Food insecurity: Secondary | ICD-10-CM | POA: Diagnosis not present

## 2023-10-21 DIAGNOSIS — Z9152 Personal history of nonsuicidal self-harm: Secondary | ICD-10-CM | POA: Diagnosis not present

## 2023-10-21 DIAGNOSIS — R45851 Suicidal ideations: Secondary | ICD-10-CM | POA: Diagnosis present

## 2023-10-21 DIAGNOSIS — G4700 Insomnia, unspecified: Secondary | ICD-10-CM | POA: Diagnosis present

## 2023-10-21 DIAGNOSIS — Z8249 Family history of ischemic heart disease and other diseases of the circulatory system: Secondary | ICD-10-CM | POA: Diagnosis not present

## 2023-10-21 DIAGNOSIS — Z5900 Homelessness unspecified: Secondary | ICD-10-CM

## 2023-10-21 DIAGNOSIS — F333 Major depressive disorder, recurrent, severe with psychotic symptoms: Secondary | ICD-10-CM | POA: Diagnosis present

## 2023-10-21 DIAGNOSIS — M542 Cervicalgia: Secondary | ICD-10-CM | POA: Diagnosis not present

## 2023-10-21 DIAGNOSIS — F1994 Other psychoactive substance use, unspecified with psychoactive substance-induced mood disorder: Secondary | ICD-10-CM | POA: Insufficient documentation

## 2023-10-21 LAB — I-STAT CHEM 8, ED
BUN: 16 mg/dL (ref 6–20)
Calcium, Ion: 1.25 mmol/L (ref 1.15–1.40)
Chloride: 101 mmol/L (ref 98–111)
Creatinine, Ser: 1.1 mg/dL — ABNORMAL HIGH (ref 0.44–1.00)
Glucose, Bld: 66 mg/dL — ABNORMAL LOW (ref 70–99)
HCT: 30 % — ABNORMAL LOW (ref 36.0–46.0)
Hemoglobin: 10.2 g/dL — ABNORMAL LOW (ref 12.0–15.0)
Potassium: 3.4 mmol/L — ABNORMAL LOW (ref 3.5–5.1)
Sodium: 143 mmol/L (ref 135–145)
TCO2: 27 mmol/L (ref 22–32)

## 2023-10-21 MED ORDER — ALUM & MAG HYDROXIDE-SIMETH 200-200-20 MG/5ML PO SUSP
30.0000 mL | ORAL | Status: DC | PRN
  Administered 2023-10-23: 30 mL via ORAL
  Filled 2023-10-21: qty 30

## 2023-10-21 MED ORDER — ROSUVASTATIN CALCIUM 40 MG PO TABS
40.0000 mg | ORAL_TABLET | Freq: Every day | ORAL | Status: DC
  Administered 2023-10-22 – 2023-10-26 (×5): 40 mg via ORAL
  Filled 2023-10-21 (×7): qty 1

## 2023-10-21 MED ORDER — QUETIAPINE FUMARATE 50 MG PO TABS
150.0000 mg | ORAL_TABLET | Freq: Every day | ORAL | Status: DC
  Administered 2023-10-21: 150 mg via ORAL
  Filled 2023-10-21 (×4): qty 1

## 2023-10-21 MED ORDER — MAGNESIUM HYDROXIDE 400 MG/5ML PO SUSP: 30.0000 mL | Freq: Every day | ORAL | Status: DC | PRN

## 2023-10-21 MED ORDER — GABAPENTIN 100 MG PO CAPS
100.0000 mg | ORAL_CAPSULE | Freq: Three times a day (TID) | ORAL | Status: DC
  Administered 2023-10-21 – 2023-10-26 (×14): 100 mg via ORAL
  Filled 2023-10-21 (×21): qty 1

## 2023-10-21 MED ORDER — LOSARTAN POTASSIUM 50 MG PO TABS
100.0000 mg | ORAL_TABLET | Freq: Every day | ORAL | Status: DC
  Administered 2023-10-22 – 2023-10-26 (×5): 100 mg via ORAL
  Filled 2023-10-21 (×8): qty 2

## 2023-10-21 MED ORDER — FERROUS SULFATE 325 (65 FE) MG PO TABS
325.0000 mg | ORAL_TABLET | ORAL | Status: DC
  Administered 2023-10-23 – 2023-10-25 (×2): 325 mg via ORAL
  Filled 2023-10-21 (×4): qty 1

## 2023-10-21 MED ORDER — QUETIAPINE FUMARATE 50 MG PO TABS
150.0000 mg | ORAL_TABLET | Freq: Every day | ORAL | Status: DC
  Administered 2023-10-21: 150 mg via ORAL
  Filled 2023-10-21: qty 1

## 2023-10-21 MED ORDER — DIPHENHYDRAMINE HCL 50 MG/ML IJ SOLN
50.0000 mg | Freq: Three times a day (TID) | INTRAMUSCULAR | Status: DC | PRN
  Administered 2023-10-25: 50 mg via INTRAMUSCULAR
  Filled 2023-10-21: qty 1

## 2023-10-21 MED ORDER — LORAZEPAM 2 MG/ML IJ SOLN
2.0000 mg | Freq: Three times a day (TID) | INTRAMUSCULAR | Status: DC | PRN
  Administered 2023-10-25: 2 mg via INTRAMUSCULAR
  Filled 2023-10-21: qty 1

## 2023-10-21 MED ORDER — ADULT MULTIVITAMIN W/MINERALS CH
1.0000 | ORAL_TABLET | Freq: Every day | ORAL | Status: DC
  Administered 2023-10-22 – 2023-10-26 (×5): 1 via ORAL
  Filled 2023-10-21 (×7): qty 1

## 2023-10-21 MED ORDER — DIPHENHYDRAMINE HCL 25 MG PO CAPS
50.0000 mg | ORAL_CAPSULE | Freq: Three times a day (TID) | ORAL | Status: DC | PRN
  Administered 2023-10-22 – 2023-10-25 (×3): 50 mg via ORAL
  Filled 2023-10-21 (×3): qty 2

## 2023-10-21 MED ORDER — HALOPERIDOL LACTATE 5 MG/ML IJ SOLN
5.0000 mg | Freq: Three times a day (TID) | INTRAMUSCULAR | Status: DC | PRN
  Administered 2023-10-25: 5 mg via INTRAMUSCULAR
  Filled 2023-10-21: qty 1

## 2023-10-21 MED ORDER — NICOTINE 21 MG/24HR TD PT24
21.0000 mg | MEDICATED_PATCH | Freq: Every day | TRANSDERMAL | Status: DC
  Administered 2023-10-26: 21 mg via TRANSDERMAL
  Filled 2023-10-21 (×7): qty 1

## 2023-10-21 MED ORDER — DIPHENHYDRAMINE HCL 50 MG/ML IJ SOLN: 50.0000 mg | Freq: Three times a day (TID) | INTRAMUSCULAR | Status: DC | PRN

## 2023-10-21 MED ORDER — METFORMIN HCL 500 MG PO TABS
500.0000 mg | ORAL_TABLET | Freq: Two times a day (BID) | ORAL | Status: DC
  Administered 2023-10-21 – 2023-10-26 (×9): 500 mg via ORAL
  Filled 2023-10-21 (×15): qty 1

## 2023-10-21 MED ORDER — HALOPERIDOL 5 MG PO TABS
5.0000 mg | ORAL_TABLET | Freq: Three times a day (TID) | ORAL | Status: DC | PRN
  Administered 2023-10-22 – 2023-10-25 (×3): 5 mg via ORAL
  Filled 2023-10-21 (×3): qty 1

## 2023-10-21 MED ORDER — LORAZEPAM 2 MG/ML IJ SOLN: 2.0000 mg | Freq: Three times a day (TID) | INTRAMUSCULAR | Status: DC | PRN

## 2023-10-21 MED ORDER — ACETAMINOPHEN 325 MG PO TABS
650.0000 mg | ORAL_TABLET | Freq: Four times a day (QID) | ORAL | Status: DC | PRN
  Administered 2023-10-21: 650 mg via ORAL
  Filled 2023-10-21: qty 2

## 2023-10-21 MED ORDER — HALOPERIDOL LACTATE 5 MG/ML IJ SOLN: 10.0000 mg | Freq: Three times a day (TID) | INTRAMUSCULAR | Status: DC | PRN

## 2023-10-21 MED ORDER — FERROUS SULFATE 325 (65 FE) MG PO TABS
325.0000 mg | ORAL_TABLET | ORAL | Status: DC
  Administered 2023-10-21: 325 mg via ORAL
  Filled 2023-10-21: qty 1

## 2023-10-21 NOTE — ED Provider Notes (Signed)
 Emergency Medicine Observation Re-evaluation Note  Kaitlyn Gray is a 48 y.o. female, seen on rounds today.  Pt initially presented to the ED for complaints of Generalized Body Aches Currently, the patient is IVC awaiting placement.  Physical Exam  BP (!) 145/92 (BP Location: Right Arm)   Pulse (!) 51   Temp 98.1 F (36.7 C) (Oral)   Resp 18   SpO2 100%  Physical Exam General: Resting comfortably Cardiac: Good peripheral perfusion Lungs: bilateral chest rise Psych: resting comfortably  ED Course / MDM  EKG:   I have reviewed the labs performed to date as well as medications administered while in observation.  Recent changes in the last 24 hours include Seen by psychiatry recommends impatient.  Awaiting placement.  Plan  Current plan is for awaiting psych placement.    Albertus Hughs, DO 10/21/23 [phone]

## 2023-10-21 NOTE — Progress Notes (Signed)
 Temperance did not attend wrap up group

## 2023-10-21 NOTE — Plan of Care (Signed)
   Problem: Activity: Goal: Interest or engagement in activities will improve Outcome: Progressing   Problem: Coping: Goal: Ability to verbalize frustrations and anger appropriately will improve Outcome: Progressing   Problem: Safety: Goal: Periods of time without injury will increase Outcome: Progressing

## 2023-10-21 NOTE — Progress Notes (Signed)
 Pt has been accepted to [facility] on 10/21/2023 Bed assignment: 401-2  Pt meets inpatient criteria per: Celesta Coke NP  Attending Physician will be: Dr. Lindle Rhea   Report can be called [id number]: Adult unit: [phone]  Pt can arrive after discharges   Care Team Notified: [facility] Kathryn Parish RN, Heyward Loud NP, Leocadia Rains RN, Onetha Bile RN  [place] Leonardo Makris LCSW-A   10/21/2023 12:51 PM

## 2023-10-21 NOTE — Progress Notes (Signed)
 Admission note: Patient is a 48 year old AA female, voluntarily   admitted from ED for polysubstance use disorder, and suicidal ideation with plan to shoot herself or run into train track. Patient arrived to the unit via safe transport at 1445, awake, and alert and oriented to place, person, and situation. Patient appeared  sad, and was crying a lot during admission interview with her. When asked what brought patient to the [facility] unit, patient states "I'm lonely, I don't have anybody, I want to end my life, that's why I do drugs. Patient states to taking crack cocaine and heroine on a daily basis. Patient denies HI/AVH, however, endorses passive SI. Patient states she will inform the staff if feeling of SI worsened. No acute distress at this time.  Pt has orientation to unit, room and routine. Information packet given to patient and safety information discussed with her .  Admission INP armband ID verified with patient, fall risk assessment completed with Patient and she verbalized understanding of risks associated with falls. No contraband found during skin assessment, Skin, clean-dry- intact without evidence of bruising, or skin tears and tracks marks. Q 15 minutes safety observation in place. Staff will continue to provide support to patient.

## 2023-10-21 NOTE — Tx Team (Signed)
 Initial Treatment Plan 10/21/2023 6:16 PM Kaitlyn Gray [id number]    PATIENT STRESSORS: Health problems   Substance abuse     PATIENT STRENGTHS: Ability for insight    PATIENT IDENTIFIED PROBLEMS: Suicidal ideation with plans   Substance use disorder  Depression   Loneliness                DISCHARGE CRITERIA:  Verbal commitment to aftercare and medication compliance  PRELIMINARY DISCHARGE PLAN: Return to previous work or school arrangements  PATIENT/FAMILY INVOLVEMENT: This treatment plan has been presented to and reviewed with the patient, Kaitlyn Gray, has been given the opportunity to ask questions and make suggestions.  Jeoffrey Mole, RN 10/21/2023, 6:16 PM

## 2023-10-21 NOTE — Consult Note (Signed)
 [facility] Consult Note  Patient Name: Kaitlyn Gray MRN: [id number] DOB: 1975/11/06 DATE OF Consult: 10/21/2023  PRIMARY PSYCHIATRIC DIAGNOSES Rule out Substance induced psychotic disorder; Rule out unspecified schizophrenia spectrum and other psychotic disorder; Rule out unspecified bipolar and related disorder; Rule out delirium due to general medical condition  Based on my current evaluation and assessment of the patient, she is a 48 y.o. female with command auditory hallucinations compelling her to take an intentional ingestion of illicit substances, which she has access to in the community. She is unable to contract for safety. There is grave concern that patient is at high risk for morbidity and mortality given the severity of her thought disorder, inability to attend to basic needs, and inability to contract for safety. Patient's presentation is consistent with Rule out Substance induced psychotic disorder; Rule out unspecified schizophrenia spectrum and other psychotic disorder; Rule out unspecified bipolar and related disorder; Rule out delirium due to general medical condition. Therefore, patient does meet criteria for an intensive inpatient psychiatric hospitalization.  RECOMMENDATIONS  Inpatient psychiatric admission recommended?   YES, patient is at high risk to self at this time    Medication recommendations:  Risks, benefits, side effects and alternatives to treatments reviewed, and patient consented to the following  scheduled medication changes:  -Begin quetiapine  150 mg at bedtime for mood stabilization and psychosis. Side effects include: dizziness, lightheadedness, drowsiness, nausea, vomiting, tiredness, excess saliva/drooling, blurred vision, weight gain, constipation, headache, restlessness (especially in the legs), shaking (tremor), muscle spasm, mask-like expression of the face, unusual uncontrolled movements called tardive dyskinesia (these uncontrolled movements  are often of the face, mouth, tongue, arms, or legs), and trouble sleeping may occur. Note the following serious side effects: fainting, suicidal thoughts, trouble swallowing, and seizures, warranting immediate discontinuation of this medication.    As needed medications to manage patient's acute symptoms while in hospital care: QTc is 484 ms as of 10/2023 -Maximize utilization of verbal de-escalation techniques, if attempts are unsuccessful and patient poses a threat to self and others: Consider lorazepam  2 mg PO/IM every 6 hours as needed for severe agitation. Would offer patient the option of taking PO medication first, but if patient refuses then may administer IM medication as a last resort. Avoid utilizing any as needed antipsychotic medications at this time given that her QTc is approaching prolongation.   Non-Medication recommendations:  -Note: Please stop all antipsychotic and QTc prolonging medications if patient's QTc is greater than 500 ms. Of note, to decrease the risk of prolonged QTc, please maintain potassium and magnesium  levels within normal ranges. -Agree with work up for organic causes of altered mentation and mood dysregulation, consider the following if not already performed: CT of the head, CBC and differential, basic metabolic profile, liver function tests (if abnormal consider ammonia  level), urinalysis, urine toxicology screen, vitamin B12 level, vitamin D level, TSH with reflex free T4  Observation recommendations:  per unit protocol for monitoring psychotic and suicidal patient   Follow-Up Telepsychiatry C/L services: We will continue to follow this patient with you until stabilized or discharged.  If you have any questions or concerns, please call our TeleCare Coordination service at  [phone] and ask for myself or the provider on-call.  Communication: Treatment team members (and family members if applicable) who were involved in treatment/care discussions and planning,  and with whom we spoke or engaged with via secure text/chat, include the following: primary team   Thank you for involving us  in the care of this  patient. If you have any additional questions or concerns, please call [phone] and ask for me or the provider on-call.  Total time spent in this encounter was 45 minutes with greater than 50% of time spent in counseling and coordination of care.  TELEPSYCHIATRY ATTESTATION & CONSENT  As the provider for this telehealth consult, I attest that I verified the patient's identity using two separate identifiers, introduced myself to the patient, provided my credentials, disclosed my location, and performed this encounter via a HIPAA-compliant, real-time, face-to-face, two-way, interactive audio and video platform and with the full consent and agreement of the patient (or guardian as applicable.)  Patient physical location: [facility]. Telehealth provider physical location: home office in state of [place].  Video start time: 0020  (Central Time) Video end time: 0035 (Central Time)  IDENTIFYING DATA  Kaitlyn Gray is a 48 y.o. year-old female for whom a psychiatric consultation has been ordered by the primary provider. The patient was identified using two separate identifiers.  CHIEF COMPLAINT/REASON FOR CONSULT  Suicidal ideations  HISTORY OF PRESENT ILLNESS (HPI)  I evaluated the patient today face-to-face via secure, HIPAA-compliant telepsychiatric connection, and at the request of the primary treatment team. The reason for the telepsychiatric consultation is that the patient is a 48 year old female with a documented history of "hypertension, diabetes, prior CVA, schizoaffective, polysubstance use" who presents for psychiatric evaluation given suicidal ideations. Primary team is seeking psychotropic medication recommendations, safety evaluation to determine appropriateness for more intensive psychiatric services  and diagnostic clarity as to the patient's presentation.   During one-on-one evaluation with this provider, patient was alert and oriented to self and generally to location and situation. The patient did appear to be overtly inappropriately internally preoccupied (she was distractible and appeared to be attending actively to internal stimuli); patient's thought process was concrete and linear. Patient asserted that she has a long history of substance use, primarily cocaine and heroin, admitting that her most recent use was today. She describes living a difficult life wherein she will engage in unlawful practices to earn money to pay for substances on the streets. She denies having any psychosocial supports and reports that she is utterly isolated in the community. Because of this and command auditory hallucinations compelling her to commit suicide, she is experiencing significant hopelessness and has formulated a plan to take an intentional overdose of illicit substances. Patient admitted that she has had previous intentional overdoses in the past but survived them all. She is not engaged in any mental health services or chemical dependency treatment. Patient was unable to contract for safety; however, she is amenable to engage in intensive inpatient psychiatric treatment.    PAST PSYCHIATRIC HISTORY  Inpatient psychiatric treatment: per patient denies  Outpatient mental health treatment: per patient denies Current home psychotropic medications: per patient denies Prior psychotropic medication trials: per patient, quetiapine  was effective in the past  Suicide attempts: per patient, multiple previous all via intentional overdoses on illicit substances  Trauma history: patient did not assert further concerns for trauma/exploitation beyond described in HPI  Otherwise as per HPI above.  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) 07/19/2017   Hypertension    Hypertensive emergency 05/31/2022   Ischemic stroke (HCC) 01/01/2020   subacute right middle cerebellar peduncle and pons infarction   Left-sided weakness 01/27/2022   Head CT with remote right occipital infarct which is consistent with old left-sided weakness   MDD (major depressive disorder), recurrent severe, 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   Normocytic anemia 02/09/2020   Opiate use    History of Suboxone  Therapy until 05/2021   Polysubstance abuse (HCC) 07/19/2017   Prescription drug misadventures (Seroquel )      HOME MEDICATIONS  Facility Ordered Medications  Medication   [COMPLETED] potassium chloride  SA (KLOR-CON  M) CR tablet 40 mEq   [COMPLETED] acetaminophen  (TYLENOL ) tablet 650 mg   gabapentin  (NEURONTIN ) capsule 100 mg   losartan  (COZAAR ) tablet 100 mg   metFORMIN  (GLUCOPHAGE ) tablet 500 mg   multivitamin with minerals tablet 1 tablet   rosuvastatin  (CRESTOR ) tablet 40 mg   ferrous sulfate  tablet 325 mg   PTA Medications  Medication Sig   aspirin  EC 81 MG tablet Take 81 mg by mouth daily. Swallow whole. (Patient not taking: Reported on 07/04/2023)   hydrOXYzine  (ATARAX ) 25 MG tablet Take 1 tablet (25 mg total) by mouth 3 (three) times daily as needed for anxiety. (Patient not taking: Reported on 07/04/2023)   nicotine  polacrilex (NICORETTE ) 2 MG gum Take 1 each (2 mg total) by mouth every 2 (two) hours as needed for Smoking Cravings (Patient not taking: Reported on  07/04/2023)   rosuvastatin  (CRESTOR ) 40 MG tablet Take 1 tablet (40 mg total) by mouth daily.   losartan  (COZAAR ) 100 MG tablet Take 1 tablet (100 mg total) by mouth daily.   ferrous sulfate  325 (65 FE) MG tablet Take 1 tablet (325 mg total) by mouth every other day.   Multiple Vitamin (MULTIVITAMIN WITH MINERALS) TABS tablet Take 1 tablet by mouth daily.   metFORMIN  (GLUCOPHAGE ) 500 MG tablet Take 1 tablet (500 mg total) by mouth 2 (two) times daily with a meal.     ALLERGIES  Allergies  Allergen Reactions   Tomato Anaphylaxis and Other (See Comments)    Pt reports this as an allergy, but has been eating ketchup and pizza on previous visits w/o any s/s of allergic reaction/anaphylaxis.    Hydrocodone  Itching   Latex Itching and Rash    SOCIAL & SUBSTANCE USE HISTORY  Social History   Socioeconomic History   Marital status: Single    Spouse name: Not on file   Number of children: Not on file   Years of education: Not on file   Highest education level: Not on file  Occupational History   Not on file  Tobacco Use   Smoking status: Every Day    Types: Cigarettes    Start date: 06/16/1998    Passive exposure: Current   Smokeless tobacco: Former  Building services engineer status: Every Day  Substance and Sexual Activity   Alcohol use: Not Currently   Drug use: Yes    Types: Marijuana, Cocaine    Comment: occassionally   Sexual activity: Never    Birth control/protection: None  Other Topics Concern   Not on file  Social History Narrative   ** Merged History Encounter **  Social Drivers of Corporate investment banker Strain: Not on file  Food Insecurity: Food Insecurity Present (09/17/2023)   Hunger Vital Sign    Worried About Running Out of Food in the Last Year: Sometimes true    Ran Out of Food in the Last Year: Sometimes true  Transportation Needs: Unmet Transportation Needs (09/17/2023)   PRAPARE - Administrator, Civil Service (Medical): Yes    Lack of  Transportation (Non-Medical): Yes  Physical Activity: Not on file  Stress: Not on file  Social Connections: Not on file   Social History   Tobacco Use  Smoking Status Every Day   Types: Cigarettes   Start date: 06/16/1998   Passive exposure: Current  Smokeless Tobacco Former   Social History   Substance and Sexual Activity  Alcohol Use Not Currently   Social History   Substance and Sexual Activity  Drug Use Yes   Types: Marijuana, Cocaine   Comment: occassionally    Additional pertinent information as per HPI.  FAMILY HISTORY  Family History  Problem Relation Age of Onset   Hypertension Mother    CAD Mother 60       died of MI at age 53   Hypertension Father    Family Psychiatric History (if known):  none disclosed   MENTAL STATUS EXAM (MSE)  Mental Status Exam: General Appearance: Bizarre  Orientation:  Full (Time, Place, and Person)  Memory:  Immediate;   Fair Recent;   Fair Remote;   Fair  Concentration:  Concentration: Fair and Attention Span: Fair  Recall:  Fair  Attention  Fair  Eye Contact:  Minimal  Speech:  Garbled  Language:  Fair  Volume:  Normal  Mood: "I am suicidal"  Affect:  Constricted  Thought Process:  Goal Directed  Thought Content:  Hallucinations: Command:  compelling patient to kill self  Suicidal Thoughts:  Yes.  with intent/plan  Homicidal Thoughts:  No  Judgement:  Fair  Insight:  Fair  Psychomotor Activity:  Increased and Mannerisms  Akathisia:  No  Fund of Knowledge:  Fair    Assets:  Desire for Improvement  Cognition:  WNL  ADL's:  Impaired  AIMS (if indicated):       VITALS  Blood pressure (!) 148/84, pulse 63, temperature 98.1 F (36.7 C), temperature source Oral, resp. rate 18, SpO2 100%.  LABS  Admission on 10/20/2023  Component Date Value Ref Range Status   Sodium 10/20/2023 139  135 - 145 mmol/L Final   Potassium 10/20/2023 2.9 (L)  3.5 - 5.1 mmol/L Final   Chloride 10/20/2023 103  98 - 111 mmol/L Final   CO2  10/20/2023 26  22 - 32 mmol/L Final   Glucose, Bld 10/20/2023 216 (H)  70 - 99 mg/dL Final   Glucose reference range applies only to samples taken after fasting for at least 8 hours.   BUN 10/20/2023 8  6 - 20 mg/dL Final   Creatinine, Ser 10/20/2023 0.94  0.44 - 1.00 mg/dL Final   Calcium  10/20/2023 9.1  8.9 - 10.3 mg/dL Final   GFR, Estimated 10/20/2023 >60  >60 mL/min Final   Comment: (NOTE) Calculated using the CKD-EPI Creatinine Equation (2021)    Anion gap 10/20/2023 10  5 - 15 Final   Performed at [facility], [address] [place], [place] [address]   WBC 10/20/2023 4.2  4.0 - 10.5 K/uL Final   RBC 10/20/2023 4.15  3.87 - 5.11 MIL/uL Final  Hemoglobin 10/20/2023 9.4 (L)  12.0 - 15.0 g/dL Final   HCT 13/24/4010 32.1 (L)  36.0 - 46.0 % Final   MCV 10/20/2023 77.3 (L)  80.0 - 100.0 fL Final   MCH 10/20/2023 22.7 (L)  26.0 - 34.0 pg Final   MCHC 10/20/2023 29.3 (L)  30.0 - 36.0 g/dL Final   RDW 27/25/3664 21.8 (H)  11.5 - 15.5 % Final   Platelets 10/20/2023 331  150 - 400 K/uL Final   nRBC 10/20/2023 0.0  0.0 - 0.2 % Final   Performed at [facility], [address], [place], [place] [address]   Troponin I (High Sensitivity) 10/20/2023 7  <18 ng/L Final   Comment: (NOTE) Elevated high sensitivity troponin I (hsTnI) values and significant  changes across serial measurements may suggest ACS but many other  chronic and acute conditions are known to elevate hsTnI results.  Refer to the "Links" section for chest pain algorithms and additional  guidance. Performed at [facility], [address], [place], [place] [address]    Preg, Serum 10/20/2023 NEGATIVE  NEGATIVE Final   Comment:        THE SENSITIVITY OF THIS METHODOLOGY IS >10 mIU/mL. Performed at [facility], [address] [place], [place] [address]    Total Protein 10/20/2023 6.8  6.5 - 8.1 g/dL Final   Albumin 64/33/2951 3.3 (L)  3.5 - 5.0 g/dL Final   AST 88/41/6606 15  15 - 41 U/L Final    ALT 10/20/2023 11  0 - 44 U/L Final   Alkaline Phosphatase 10/20/2023 45  38 - 126 U/L Final   Total Bilirubin 10/20/2023 0.5  0.0 - 1.2 mg/dL Final   Bilirubin, Direct 10/20/2023 <0.1  0.0 - 0.2 mg/dL Final   Indirect Bilirubin 10/20/2023 NOT CALCULATED  0.3 - 0.9 mg/dL Final   Performed at [facility], [address] [place], [place] [address]   Alcohol, Ethyl (B) 10/20/2023 <15  <15 mg/dL Final   Comment: Please note change in reference range. (NOTE) For medical purposes only. Performed at [facility], [address] [place], [place] [address]    Total CK 10/20/2023 70  38 - 234 U/L Final   Performed at [facility], [address], [place], [place] [address]   Magnesium  10/20/2023 1.7  1.7 - 2.4 mg/dL Final   Performed at [facility], [address] [place], [place] [address]   SARS Coronavirus 2 by RT PCR 10/20/2023 NEGATIVE  NEGATIVE Final   Influenza A by PCR 10/20/2023 NEGATIVE  NEGATIVE Final   Influenza B by PCR 10/20/2023 NEGATIVE  NEGATIVE Final   Comment: (NOTE) The Xpert Xpress SARS-CoV-2/FLU/RSV plus assay is intended as an aid in the diagnosis of influenza from Nasopharyngeal swab specimens and should not be used as a sole basis for treatment. Nasal washings and aspirates are unacceptable for Xpert Xpress SARS-CoV-2/FLU/RSV testing.  Fact Sheet for Patients: BloggerCourse.com  Fact Sheet for Healthcare Providers: SeriousBroker.it  This test is not yet approved or cleared by the [place]  FDA and has been authorized for detection and/or diagnosis of SARS-CoV-2 by FDA under an Emergency Use Authorization (EUA). This EUA will remain in effect (meaning this test can be used) for the duration of the COVID-19 declaration under Section 564(b)(1) of the Act, 21 U.S.C. section 360bbb-3(b)(1), unless the authorization is terminated or revoked.     Resp Syncytial Virus by PCR  10/20/2023 NEGATIVE  NEGATIVE Final   Comment: (NOTE) Fact Sheet for Patients: BloggerCourse.com  Fact Sheet for Healthcare Providers: SeriousBroker.it  This test is not yet approved or cleared by the [place]  FDA and has been authorized for detection and/or diagnosis of SARS-CoV-2 by FDA under an Emergency Use Authorization (EUA). This EUA will remain in effect (meaning this test can be used) for the duration of the COVID-19 declaration under Section 564(b)(1) of the Act, 21 U.S.C. section 360bbb-3(b)(1), unless the authorization is terminated or revoked.  Performed at [facility], [address] [place], [place] [address]    Troponin I (High Sensitivity) 10/20/2023 7  <18 ng/L Final   Comment: (NOTE) Elevated high sensitivity troponin I (hsTnI) values and significant  changes across serial measurements may suggest ACS but many other  chronic and acute conditions are known to elevate hsTnI results.  Refer to the "Links" section for chest pain algorithms and additional  guidance. Performed at [facility] Lab, [address] [place], [place] [address]     PSYCHIATRIC REVIEW OF SYSTEMS (ROS)  ROS: Notable for the following relevant positive findings: Review of Systems  Psychiatric/Behavioral:  Positive for depression, hallucinations, substance abuse and suicidal ideas. Negative for memory loss. The patient is nervous/anxious and has insomnia.     Additional findings:      Musculoskeletal: Impaired      Gait & Station: Laying/Sitting      Pain Screening: Present - mild to moderate      Nutrition & Dental Concerns: Decrease in food intake and/or loss of appetite  RISK FORMULATION/ASSESSMENT  Is the patient experiencing any suicidal or homicidal ideations: Yes       Explain if yes: command auditory hallucinations compelling her to take an intentional ingestion of illicit substances, which she has access to in the  community Protective factors considered for safety management: current care in a highly monitored health care setting  Risk factors/concerns considered for safety management:  Prior attempt Depression Substance abuse/dependence Physical illness/chronic pain Access to lethal means Hopelessness Impulsivity Isolation Barriers to accessing treatment Unmarried  Is there a safety management plan with the patient and treatment team to minimize risk factors and promote protective factors: Yes           Explain: psychiatric hospitalization Is crisis care placement or psychiatric hospitalization recommended: Yes     Based on my current evaluation and risk assessment, patient is determined at this time to be at:  High risk  *RISK ASSESSMENT Risk assessment is a dynamic process; it is possible that this patient's condition, and risk level, may change. This should be re-evaluated and managed over time as appropriate. Please re-consult psychiatric consult services if additional assistance is needed in terms of risk assessment and management. If your team decides to discharge this patient, please advise the patient how to best access emergency psychiatric services, or to call 911, if their condition worsens or they feel unsafe in any way.   Levora Reas, MD Telepsychiatry Consult Services

## 2023-10-21 NOTE — Plan of Care (Signed)
   Problem: Education: Goal: Emotional status will improve Outcome: Progressing Goal: Mental status will improve Outcome: Progressing   Problem: Activity: Goal: Interest or engagement in activities will improve Outcome: Progressing Goal: Sleeping patterns will improve Outcome: Progressing   Problem: Safety: Goal: Periods of time without injury will increase Outcome: Progressing

## 2023-10-21 NOTE — ED Notes (Signed)
 TTS in process

## 2023-10-22 DIAGNOSIS — F1994 Other psychoactive substance use, unspecified with psychoactive substance-induced mood disorder: Secondary | ICD-10-CM | POA: Insufficient documentation

## 2023-10-22 DIAGNOSIS — F333 Major depressive disorder, recurrent, severe with psychotic symptoms: Secondary | ICD-10-CM | POA: Diagnosis not present

## 2023-10-22 MED ORDER — ARIPIPRAZOLE 10 MG PO TABS
10.0000 mg | ORAL_TABLET | Freq: Every day | ORAL | Status: DC
  Administered 2023-10-22 – 2023-10-23 (×2): 10 mg via ORAL
  Filled 2023-10-22 (×4): qty 1

## 2023-10-22 MED ORDER — QUETIAPINE FUMARATE 25 MG PO TABS
25.0000 mg | ORAL_TABLET | Freq: Every day | ORAL | Status: DC
  Administered 2023-10-22 – 2023-10-25 (×3): 25 mg via ORAL
  Filled 2023-10-22 (×7): qty 1

## 2023-10-22 NOTE — Progress Notes (Signed)
 Patient unsteady with the walker and feeling dizzy. Patient provided a wheelchair.

## 2023-10-22 NOTE — H&P (Addendum)
 Psychiatric Admission Assessment Adult  Patient Identification: Kaitlyn Gray MRN:  [id number] Date of Evaluation:  10/22/2023 Chief Complaint: Worsening depression associated with suicidal thoughts Principal Diagnosis: MDD (major depressive disorder), recurrent, severe, with psychosis (HCC) Diagnosis:  Principal Problem:   MDD (major depressive disorder), recurrent, severe, with psychosis (HCC) Active Problems:   Psychoactive substance-induced organic mood disorder (HCC)  History of Present Illness:  48 year old [place] American female, single, unemployed, on SSI disability, lives alone.  Background history of MDD recurrent, substance use disorder, substance-induced mood disorder and multiple medical comorbidities.  Presented to the emergency room on account of pain.  Intoxicated with cocaine at presentation.  Reported auditory and visual hallucinations.  Voices were telling her to kill herself.  Voluntary admission to seek help.  Chart reviewed today.  Patient discussed that multidisciplinary team meeting.  Staff reports that patient has been isolative in her room.  No challenging behavior here.  At interview with patient, she reports a long history of substance use disorder.  She has two children who are in foster care.  Patient states that she has no communication with her children or any family member.  She does not have any social support structure.  Patient reports daily use of substances.  Substance of choice was cocaine, heroin and THC.  Patient reports fleeting auditory and visual hallucination while under the influence.  States that she has acted on those hallucinations in the past.  She has cut herself on multiple occasions with a razor.  Patient states that she had been admitted on multiple occasions due to the effect of substances.  Since he has been here, the voices are less intense.  She tells me that her goal is to go to rehab so that she can stay away from psychoactive  substances.  No current suicidal thoughts.  No feelings of being taken over by an external force.  No current rageful thoughts towards others or to property. No evidence of PTSD.  No evidence of OCD.  Patient is not endorsing any new stressors in her life.  No legal issues.  No interpersonal relational issues.  No new health issues.  Patient used to be treated with Abilify  maintainer.  She had not been adherent for a while now.  She wants to initiate the oral Abilify  during this hospital stay.  Review of symptoms essentially as above.   Total Time spent with patient: 1 hour  Past Psychiatric History:  Extensive history of substance use disorder.  Drug of choice is opioids, cocaine and THC.  History of major depressive disorder recurrent and substance-induced psychotic/mood disorder.  Patient has had over half a dozen hospitalizations in the past.  She used to be on Suboxone .  No past inpatient rehab.  She used to be on Suboxone . Patient has multiple self-injurious behavior in context of being intoxicated with psychoactive substances.  She has a history of being violent while under the influence.  She has fleeting psychotic and manic symptoms while under the influence. Patient did well with the Abilify  maintainer.  No recollection of other medicines that she has tried in the past.    [place] Scale:  Flowsheet Row Ad[facility] (Current) [facility] from 10/20/2023 in [facility] Department at [facility] ED from 09/28/2023 in [facility] Department at [facility]  C-SSRS RISK CATEGORY No Risk No Risk No Risk         Alcohol Screening: 1. How often do  you have a drink containing alcohol?: Never 2. How many drinks containing alcohol do you have on a typical day when you are drinking?: 1 or 2 3. How often do you have six or more drinks on one occasion?: Never AUDIT-C Score: 0 4. How often during the last  year have you found that you were not able to stop drinking once you had started?: Never 5. How often during the last year have you failed to do what was normally expected from you because of drinking?: Never 6. How often during the last year have you needed a first drink in the morning to get yourself going after a heavy drinking session?: Never 7. How often during the last year have you had a feeling of guilt of remorse after drinking?: Never 8. How often during the last year have you been unable to remember what happened the night before because you had been drinking?: Never 9. Have you or someone else been injured as a result of your drinking?: No 10. 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) 07/19/2017   Hypertension    Hypertensive emergency 05/31/2022   Ischemic stroke (HCC) 01/01/2020   subacute right middle cerebellar peduncle and pons infarction   Left-sided weakness 01/27/2022   Head CT with remote right occipital infarct which is consistent with old left-sided weakness   MDD (major depressive disorder), recurrent severe, 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   Normocytic anemia 02/09/2020   Opiate use    History of Suboxone  Therapy until 05/2021    Polysubstance abuse (HCC) 07/19/2017   Prescription drug misadventures (Seroquel )     Past Surgical History:  Procedure Laterality Date   CESAREAN SECTION     Family History:  Family History  Problem Relation Age of Onset   Hypertension Mother    CAD Mother 43       died of MI at age 62   Hypertension Father    Family Psychiatric  History:  Family history of addiction.  No family history of suicide.  Tobacco Screening:  Social History   Tobacco Use  Smoking Status Every Day   Types: Cigarettes   Start date: 06/16/1998   Passive exposure: Current  Smokeless Tobacco Former    BH Tobacco Counseling     Are you interested in Tobacco Cessation Medications?  Yes, implement Nicotene Replacement Protocol Counseled patient on smoking cessation:  Refused/Declined practical counseling Reason Tobacco Screening Not Completed: No value filed.       Social History:  Social History   Substance and Sexual Activity  Alcohol Use Not Currently     Social History   Substance and Sexual Activity  Drug Use Yes   Types: Marijuana, Cocaine, "Crack" cocaine   Comment: occassionally    Additional Social  History: Patient was raised by her grandmother as her mother was not emotionally available.  Her mother suffered from substance use disorder.  Her grandmother was murdered in 55.  Patient reports delayed developmental milestones.  She had special education.  She was kicked out in 12 th grade for fighting.  She has been incarcerated a couple of times for fighting.  She reports illicit means of sustaining her habit.  Patient is on SSI disability.  She lives alone.  No contact with her two children who are in foster care.  Limited support in the community.  Allergies:   Allergies  Allergen Reactions   Tomato Anaphylaxis and Other (See Comments)    Pt reports this as an allergy, but has been eating ketchup and pizza on previous visits w/o any s/s of allergic reaction/anaphylaxis.     Hydrocodone  Itching   Latex Itching and Rash   Lab Results:  Results for orders placed or performed during the hospital encounter of 10/20/23 (from the past 48 hours)  Basic metabolic panel     Status: Abnormal   Collection Time: 10/20/23  3:25 PM  Result Value Ref Range   Sodium 139 135 - 145 mmol/L   Potassium 2.9 (L) 3.5 - 5.1 mmol/L   Chloride 103 98 - 111 mmol/L   CO2 26 22 - 32 mmol/L   Glucose, Bld 216 (H) 70 - 99 mg/dL    Comment: Glucose reference range applies only to samples taken after fasting for at least 8 hours.   BUN 8 6 - 20 mg/dL   Creatinine, Ser 6.57 0.44 - 1.00 mg/dL   Calcium  9.1 8.9 - 10.3 mg/dL   GFR, Estimated >84 >69 mL/min    Comment: (NOTE) Calculated using the CKD-EPI Creatinine Equation (2021)    Anion gap 10 5 - 15    Comment: Performed at [facility], [address] [place], [place] [address]  CBC     Status: Abnormal   Collection Time: 10/20/23  3:25 PM  Result Value Ref Range   WBC 4.2 4.0 - 10.5 K/uL   RBC 4.15 3.87 - 5.11 MIL/uL   Hemoglobin 9.4 (L) 12.0 - 15.0 g/dL   HCT 84.1 (L) 32.4 - 40.1 %   MCV 77.3 (L) 80.0 - 100.0 fL   MCH 22.7 (L) 26.0 - 34.0 pg   MCHC 29.3 (L) 30.0 - 36.0 g/dL   RDW 02.7 (H) 25.3 - 66.4 %   Platelets 331 150 - 400 K/uL   nRBC 0.0 0.0 - 0.2 %    Comment: Performed at [facility], [address], [place], [place] [address]  Troponin I (High Sensitivity)     Status: None   Collection Time: 10/20/23  3:25 PM  Result Value Ref Range   Troponin I (High Sensitivity) 7 <18 ng/L    Comment: (NOTE) Elevated high sensitivity troponin I (hsTnI) values and significant  changes across serial measurements may suggest ACS but many other  chronic and acute conditions are known to elevate hsTnI results.  Refer to the "Links" section for chest pain algorithms and additional  guidance. Performed at [facility], [address], [place], [place] [address]   hCG, serum, qualitative     Status: None    Collection Time: 10/20/23  3:25 PM  Result Value Ref Range   Preg, Serum NEGATIVE NEGATIVE    Comment:        THE SENSITIVITY OF THIS METHODOLOGY IS >10 mIU/mL. Performed  at [facility], [address] [place], [place] [address]   Hepatic function panel     Status: Abnormal   Collection Time: 10/20/23  3:25 PM  Result Value Ref Range   Total Protein 6.8 6.5 - 8.1 g/dL   Albumin 3.3 (L) 3.5 - 5.0 g/dL   AST 15 15 - 41 U/L   ALT 11 0 - 44 U/L   Alkaline Phosphatase 45 38 - 126 U/L   Total Bilirubin 0.5 0.0 - 1.2 mg/dL   Bilirubin, Direct <4.4 0.0 - 0.2 mg/dL   Indirect Bilirubin NOT CALCULATED 0.3 - 0.9 mg/dL    Comment: Performed at [facility], [address] [place], [place] [address]  CK     Status: None   Collection Time: 10/20/23  3:25 PM  Result Value Ref Range   Total CK 70 38 - 234 U/L    Comment: Performed at [facility], [address], [place], [place] [address]  Magnesium      Status: None   Collection Time: 10/20/23  3:25 PM  Result Value Ref Range   Magnesium  1.7 1.7 - 2.4 mg/dL    Comment: Performed at [facility], [address], [place], [place] [address]  Ethanol     Status: None   Collection Time: 10/20/23  5:02 PM  Result Value Ref Range   Alcohol, Ethyl (B) <15 <15 mg/dL    Comment: Please note change in reference range. (NOTE) For medical purposes only. Performed at [facility], [address], [place], [place] [address]   Troponin I (High Sensitivity)     Status: None   Collection Time: 10/20/23  5:02 PM  Result Value Ref Range   Troponin I (High Sensitivity) 7 <18 ng/L    Comment: (NOTE) Elevated high sensitivity troponin I (hsTnI) values and significant  changes across serial measurements may suggest ACS but many other  chronic and acute conditions are known to elevate hsTnI results.  Refer to the "Links" section for chest pain algorithms and additional  guidance. Performed at [facility], [address], [place], [place] [address]   Resp panel by RT-PCR (RSV, Flu A&B, Covid) Anterior Nasal Swab     Status: None   Collection Time: 10/20/23  8:47 PM   Specimen: Anterior Nasal Swab  Result Value Ref Range   SARS Coronavirus 2 by RT PCR NEGATIVE NEGATIVE   Influenza A by PCR NEGATIVE NEGATIVE   Influenza B by PCR NEGATIVE NEGATIVE    Comment: (NOTE) The Xpert Xpress SARS-CoV-2/FLU/RSV plus assay is intended as an aid in the diagnosis of influenza from Nasopharyngeal swab specimens and should not be used as a sole basis for treatment. Nasal washings and aspirates are unacceptable for Xpert Xpress SARS-CoV-2/FLU/RSV testing.  Fact Sheet for Patients: BloggerCourse.com  Fact Sheet for Healthcare Providers: SeriousBroker.it  This test is not yet approved or cleared by the [place]  FDA and has been authorized for detection and/or diagnosis of SARS-CoV-2 by FDA under an Emergency Use Authorization (EUA). This EUA will remain in effect (meaning this test can be used) for the duration of the COVID-19 declaration under Section 564(b)(1) of the Act, 21 U.S.C. section 360bbb-3(b)(1), unless the authorization is terminated or revoked.     Resp Syncytial Virus by PCR NEGATIVE NEGATIVE    Comment: (NOTE) Fact Sheet for Patients: BloggerCourse.com  Fact Sheet for Healthcare Providers: SeriousBroker.it  This test is not yet approved or  cleared by the [place]  FDA and has been authorized for detection and/or diagnosis of SARS-CoV-2 by FDA under an Emergency Use Authorization (EUA). This EUA will remain in effect (meaning this test can be used) for the duration of the COVID-19 declaration under Section 564(b)(1) of the Act, 21 U.S.C. section 360bbb-3(b)(1), unless the authorization is terminated or revoked.  Performed at [facility], [address], [place],  [place] [address]   I-stat chem 8, ED (not at [facility], [facility] or [facility])     Status: Abnormal   Collection Time: 10/21/23  2:03 PM  Result Value Ref Range   Sodium 143 135 - 145 mmol/L   Potassium 3.4 (L) 3.5 - 5.1 mmol/L   Chloride 101 98 - 111 mmol/L   BUN 16 6 - 20 mg/dL   Creatinine, Ser 1.91 (H) 0.44 - 1.00 mg/dL   Glucose, Bld 66 (L) 70 - 99 mg/dL    Comment: Glucose reference range applies only to samples taken after fasting for at least 8 hours.   Calcium , Ion 1.25 1.15 - 1.40 mmol/L   TCO2 27 22 - 32 mmol/L   Hemoglobin 10.2 (L) 12.0 - 15.0 g/dL   HCT 47.8 (L) 29.5 - 62.1 %    Blood Alcohol level:  Lab Results  Component Value Date   [facility] <15 10/20/2023   ETH <10 07/04/2023    Metabolic Disorder Labs:  Lab Results  Component Value Date   HGBA1C 6.3 (H) 12/23/2022   MPG 134.11 12/23/2022   MPG 136.98 11/24/2022   Lab Results  Component Value Date   PROLACTIN 20.7 12/23/2022   Lab Results  Component Value Date   CHOL 166 12/23/2022   TRIG 99 12/23/2022   HDL 51 12/23/2022   CHOLHDL 3.3 12/23/2022   VLDL 20 12/23/2022   LDLCALC 95 12/23/2022   LDLCALC 77 04/01/2022    Current Medications: Current Facility-Administered Medications  Medication Dose Route Frequency Provider Last Rate Last Admin   acetaminophen  (TYLENOL ) tablet 650 mg  650 mg Oral Q6H PRN Volanda Gruber, NP   650 mg at 10/21/23 2209   alum & mag hydroxide-simeth (MAALOX/MYLANTA) 200-200-20 MG/5ML suspension 30 mL  30 mL Oral Q4H PRN Volanda Gruber, NP       haloperidol  (HALDOL ) tablet 5 mg  5 mg Oral TID PRN Volanda Gruber, NP   5 mg at 10/22/23 1250   And   diphenhydrAMINE  (BENADRYL ) capsule 50 mg  50 mg Oral TID PRN Volanda Gruber, NP   50 mg at 10/22/23 1250   haloperidol  lactate (HALDOL ) injection 5 mg  5 mg Intramuscular TID PRN Volanda Gruber, NP       And   diphenhydrAMINE  (BENADRYL ) injection 50 mg  50 mg Intramuscular TID PRN Volanda Gruber, NP       And   LORazepam  (ATIVAN ) injection 2  mg  2 mg Intramuscular TID PRN Volanda Gruber, NP       haloperidol  lactate (HALDOL ) injection 10 mg  10 mg Intramuscular TID PRN Volanda Gruber, NP       And   diphenhydrAMINE  (BENADRYL ) injection 50 mg  50 mg Intramuscular TID PRN Volanda Gruber, NP       And   LORazepam  (ATIVAN ) injection 2 mg  2 mg Intramuscular TID PRN Volanda Gruber, NP       [START ON 10/23/2023] ferrous sulfate  tablet 325 mg  325 mg Oral QODAY Stevens, Terry J, NP  gabapentin  (NEURONTIN ) capsule 100 mg  100 mg Oral TID Volanda Gruber, NP   100 mg at 10/22/23 1244   losartan  (COZAAR ) tablet 100 mg  100 mg Oral Daily Volanda Gruber, NP   100 mg at 10/22/23 1478   magnesium  hydroxide (MILK OF MAGNESIA) suspension 30 mL  30 mL Oral Daily PRN Volanda Gruber, NP       metFORMIN  (GLUCOPHAGE ) tablet 500 mg  500 mg Oral BID WC Volanda Gruber, NP   500 mg at 10/22/23 2956   multivitamin with minerals tablet 1 tablet  1 tablet Oral Daily Volanda Gruber, NP   1 tablet at 10/22/23 2130   nicotine  (NICODERM CQ  - dosed in mg/24 hours) patch 21 mg  21 mg Transdermal Q0600 Volanda Gruber, NP       QUEtiapine  (SEROQUEL ) tablet 150 mg  150 mg Oral QHS Volanda Gruber, NP   150 mg at 10/21/23 2155   rosuvastatin  (CRESTOR ) tablet 40 mg  40 mg Oral Daily Volanda Gruber, NP   40 mg at 10/22/23 8657   PTA Medications: Medications Prior to Admission  Medication Sig Dispense Refill Last Dose/Taking   losartan  (COZAAR ) 100 MG tablet Take 1 tablet (100 mg total) by mouth daily. (Patient not taking: Reported on 10/22/2023) 90 tablet 2 Not Taking   metFORMIN  (GLUCOPHAGE ) 500 MG tablet Take 1 tablet (500 mg total) by mouth 2 (two) times daily with a meal. (Patient not taking: Reported on 10/22/2023) 60 tablet 2 Not Taking   rosuvastatin  (CRESTOR ) 40 MG tablet Take 1 tablet (40 mg total) by mouth daily. (Patient not taking: Reported on 10/22/2023) 30 tablet 2 Not Taking    Musculoskeletal: Strength & Muscle Tone: Unable to  assess. Gait & Station: Unable to assess. Patient leans: Unable to assess.  Psychiatric Specialty Exam:  Presentation  General Appearance and behavior:  Poor grooming, in bed, drooling from the right side of her mouth, right facial droop.  Eye Contact: Moderate.  Speech: Spontaneous.  Slurred.  Mood and Affect  Mood: Irritable.  Affect: Restricted.  And appropriate.  Thought Process  Thought Processes: Linear and goal directed.  Descriptions of Associations:Intact  Orientation:Full (Time, Place and Person)  Thought Content: Focused on getting into rehab.  Future-oriented.  No current suicidal thoughts.  No homicidal thoughts.  No thoughts of violence.  No negative ruminative flooding.  No guilty ruminations.  No delusional theme.  No obsessions.  Hallucinations: Fleeting auditory and visual hallucination.  Sensorium  Memory: Poor.  Judgment: Poor.  Insight: Good as she is willing to take medication and go to rehab.  Executive Functions  Concentration: Limited.  Attention Span: Distractible.  Recall: Poor.  Fund of Knowledge: Limited.  Language: Fair.  Psychomotor Activity  Decreased psychomotor activity   Physical Exam: Physical Exam HENT:     Head: Atraumatic.     Mouth/Throat:     Mouth: Mucous membranes are moist.  Eyes:     Conjunctiva/sclera: Conjunctivae normal.  Cardiovascular:     Rate and Rhythm: Normal rate.  Pulmonary:     Effort: Pulmonary effort is normal.  Musculoskeletal:        General: Normal range of motion.     Cervical back: Neck supple.  Skin:    General: Skin is warm.  Neurological:     Mental Status: She is alert.     Comments: Right facial nerve paralysis    Review of Systems  Constitutional: Negative.   HENT:  Negative.    Eyes: Negative.   Respiratory: Negative.    Cardiovascular: Negative.   Gastrointestinal: Negative.   Genitourinary: Negative.   Musculoskeletal: Negative.   Skin: Negative.    Neurological:  Positive for focal weakness.  Endo/Heme/Allergies: Negative.    Blood pressure (!) 141/90, pulse 69, temperature 98.2 F (36.8 C), temperature source Oral, resp. rate 15, height 5\' 4"  (1.626 m), weight 68 kg, SpO2 97%. Body mass index is 25.75 kg/m.  Treatment Plan Summary: 48 year old African-American female with extensive history of substance use disorder and multiple medical comorbidities.  Voluntary admission on account of fleeting psychosis associated with suicidal thoughts.  Patient appears motivated to get into rehab.  She has consented to reinstatement of aripiprazole  orally.  We will adjust as below and evaluate her further.  Observation Level/Precautions:  Fall 15 minute checks  Laboratory: Metabolic profile labs.  Psychotherapy:    Medications:   1.  Aripiprazole  10 mg a day daily. 2.  Decrease quetiapine  to 25 mg at bedtime. 3.  Continue gabapentin  100 mg twice daily. 4.  Continue medical medications at the same dose. 5.  Liberal use of as needed medications.   Consultations: PT and OT  Discharge Concerns:    Estimated LOS: 4 to 10 days.  Other: Social worker will provide information on locally available addiction treatment centers.   Physician Treatment Plan for Primary Diagnosis: MDD (major depressive disorder), recurrent, severe, with psychosis (HCC) Long Term Goal(s): Improvement in symptoms so as ready for discharge  Short Term Goals: Ability to identify changes in lifestyle to reduce recurrence of condition will improve  Physician Treatment Plan for Secondary Diagnosis: Principal Problem:   MDD (major depressive disorder), recurrent, severe, with psychosis (HCC) Active Problems:   Psychoactive substance-induced organic mood disorder (HCC)  Long Term Goal(s): Improvement in symptoms so as ready for discharge  Short Term Goals: Maintenance of sobriety  I certify that inpatient services furnished can reasonably be expected to improve the  patient's condition.    Amelie Jury, MD 5/8/202512:59 PM

## 2023-10-22 NOTE — Progress Notes (Signed)
   10/21/23 2300  Psych Admission Type (Psych Patients Only)  Admission Status Voluntary  Psychosocial Assessment  Patient Complaints Anxiety;Depression (anxiety 10/10, depression 10/10)  Eye Contact Fair  Facial Expression Anxious;Sad  Affect Anxious;Depressed  Speech Aphasic  Interaction Cautious;Guarded  Motor Activity Shuffling;Slow;Unsteady  Appearance/Hygiene In scrubs  Behavior Characteristics Cooperative;Anxious  Mood Anxious;Depressed  Thought Process  Coherency Circumstantial  Content Preoccupation  Delusions None reported or observed  Perception WDL  Hallucination None reported or observed  Judgment Limited  Confusion None  Danger to Self  Current suicidal ideation? Passive  Self-Injurious Behavior Some self-injurious ideation observed or expressed.  No lethal plan expressed   Agreement Not to Harm Self Yes  Description of Agreement verbal  Danger to Others  Danger to Others None reported or observed

## 2023-10-22 NOTE — Inpatient Diabetes Management (Signed)
 Inpatient Diabetes Program Recommendations  AACE/ADA: New Consensus Statement on Inpatient Glycemic Control (2015)  Target Ranges:  Prepandial:   less than 140 mg/dL      Peak postprandial:   less than 180 mg/dL (1-2 hours)      Critically ill patients:  140 - 180 mg/dL    Latest Reference Range & Units 10/21/23 14:03  Glucose 70 - 99 mg/dL 66 (L)  (L): Data is abnormally low    To ED with Generalized Body Aches/ Suicidal Ideation  History: DM2, Schizoaffective, Polysubstance use   Home DM Meds: Metformin  500 mg BID  Current Orders: Metformin  500 mg BID    MD- Note pt getting her home dose of Metformin    Please add orders for CBG checks TID before meals     --Will follow patient during hospitalization--  Langston Pippins RN, MSN, CDCES Diabetes Coordinator Inpatient Glycemic Control Team Team Pager: [phone] (8a-5p)

## 2023-10-22 NOTE — Plan of Care (Signed)

## 2023-10-22 NOTE — Progress Notes (Signed)
 Patient responding loudly to AVH in her room after lunch. She reports she can see three people that are talking to her. Patient is mildly agitated. While taking the medication patient reports her hallucinations were telling her that if she took the medication it would kill her. PO haldol  and benadryl  administered per MD order.

## 2023-10-22 NOTE — Group Note (Signed)
 LCSW Group Therapy Note   Group Date: 10/22/2023 Start Time: 1100 End Time: 1200  Participation:  did not attend  Type of Therapy:  Group Therapy  Title:  Speaking from the Heart: Communicating with Understanding and Empathy  Objective:  To help participants develop effective communication skills to express themselves clearly, listen actively, and navigate conflicts in a healthy way. Goals: Increase awareness of verbal and non-verbal communication skills. Practice using "I" statements and active listening techniques. Learn coping strategies for managing communication stress.  Summary:  Participants explored the importance of communication, discussed challenges, and practiced skills such as active listening and assertive expression. They reflected on past experiences and identified ways to improve communication in their daily lives.  Therapeutic Modalities: Cognitive-Behavioral Therapy (CBT): Restructuring negative thought patterns in communication. Mindfulness: Staying present and calm during conversations. Psychoeducation: Learning about effective communication techniques.   Kaitlyn Gray O Chandell Attridge, LCSWA 10/22/2023  1:15 PM

## 2023-10-22 NOTE — Group Note (Signed)
 Date:  10/22/2023 Time:  8:55 AM  Group Topic/Focus:  Goals Group:   The focus of this group is to help patients establish daily goals to achieve during treatment and discuss how the patient can incorporate goal setting into their daily lives to aide in recovery.    Participation Level:  Did Not Attend   Kaitlyn Gray 10/22/2023, 8:55 AM

## 2023-10-22 NOTE — Progress Notes (Addendum)
 Patient is alert, oriented, and mostly cooperative. Patient endorses SI and AVH. Denies HI. Verbally contracts for safety. Patient is unsteady when using her walker.   Please see previous progress note for details about mild agitation protocol medications administered due to AVH.   Scheduled medications administered per MD order. Support provided. Patient educated on safety on the unit and medications. Routine safety checks every 15 minutes. Patient stated understanding to tell nurse about any new physical symptoms. Patient understands to tell staff of any needs.     No adverse drug reactions noted. Patient remains safe at this time and will continue to monitor.    10/22/23 1000  Psych Admission Type (Psych Patients Only)  Admission Status Voluntary  Psychosocial Assessment  Patient Complaints Anxiety;Depression  Eye Contact Fair  Facial Expression Anxious  Affect Anxious;Depressed  Speech Aphasic  Interaction Cautious  Motor Activity Shuffling;Slow;Unsteady  Appearance/Hygiene Disheveled  Behavior Characteristics Cooperative  Mood Depressed;Anxious  Thought Process  Coherency Circumstantial  Content Preoccupation  Delusions None reported or observed  Perception Hallucinations  Hallucination Auditory  Judgment Poor  Confusion None  Danger to Self  Current suicidal ideation? Passive  Self-Injurious Behavior Some self-injurious ideation observed or expressed.  No lethal plan expressed   Agreement Not to Harm Self Yes  Description of Agreement verbal  Danger to Others  Danger to Others None reported or observed

## 2023-10-22 NOTE — [facility] Group Notes (Signed)
 Psychoeducational Group Note  Date:  10/22/2023 Time:  2000  Group Topic/Focus:  Wrap up group  Participation Level: Did Not Attend  Participation Quality:  Not Applicable  Affect:  Not Applicable  Cognitive:  Not Applicable  Insight:  Not Applicable  Engagement in Group: Not Applicable  Additional Comments:  Did not attend.   Catharine Clock 10/22/2023, 9:32 PM

## 2023-10-22 NOTE — [facility] Suicide Risk Assessment (Signed)
 [facility] Admission Suicide Risk Assessment   Nursing information obtained from:  Patient Demographic factors:  Living alone, Unemployed Current Mental Status:  Suicidal ideation indicated by patient Loss Factors:  Decline in physical health Historical Factors:  NA Risk Reduction Factors:  NA  Total Time spent with patient: 30 minutes Principal Problem: MDD (major depressive disorder), recurrent, severe, with psychosis (HCC) Diagnosis:  Principal Problem:   MDD (major depressive disorder), recurrent, severe, with psychosis (HCC) Active Problems:   Psychoactive substance-induced organic mood disorder (HCC)  Subjective Data:  48 year old [place] American female, single, unemployed, on SSI disability, lives alone. Background history of MDD recurrent, substance use disorder, substance-induced mood disorder and multiple medical comorbidities. Presented to the emergency room on account of pain. Intoxicated with cocaine at presentation. Reported auditory and visual hallucinations. Voices were telling her to kill herself. Voluntary admission to seek help.    Continued Clinical Symptoms:  Alcohol Use Disorder Identification Test Final Score (AUDIT): 0 The "Alcohol Use Disorders Identification Test", Guidelines for Use in Primary Care, Second Edition.  World Science writer [place]). Score between 0-7:  no or low risk or alcohol related problems. Score between 8-15:  moderate risk of alcohol related problems. Score between 16-19:  high risk of alcohol related problems. Score 20 or above:  warrants further diagnostic evaluation for alcohol dependence and treatment.   CLINICAL FACTORS:   Alcohol/Substance Abuse/Dependencies Medical Diagnoses and Treatments/Surgeries   Musculoskeletal: Strength & Muscle Tone: Unable to assess Gait & Station: Mobilizes with a walker. Patient leans: Unable to assess.  Psychiatric Specialty Exam:  Presentation  General Appearance and behavior:  Poor grooming,  in bed, drooling from the right side of her mouth, right facial droop.   Eye Contact: Moderate.   Speech: Spontaneous.  Slurred.   Mood and Affect  Mood: Irritable.   Affect: Restricted.  And appropriate.   Thought Process  Thought Processes: Linear and goal directed.   Descriptions of Associations:Intact   Orientation:Full (Time, Place and Person)   Thought Content: Focused on getting into rehab.  Future-oriented.  No current suicidal thoughts.  No homicidal thoughts.  No thoughts of violence.  No negative ruminative flooding.  No guilty ruminations.  No delusional theme.  No obsessions.   Hallucinations: Fleeting auditory and visual hallucination.   Sensorium  Memory: Poor.   Judgment: Poor.   Insight: Good as she is willing to take medication and go to rehab.   Executive Functions  Concentration: Limited.   Attention Span: Distractible.   Recall: Poor.   Fund of Knowledge: Limited.   Language: Fair.   Psychomotor Activity  Decreased psychomotor activity  Physical Exam: Physical Exam ROS Blood pressure (!) 141/90, pulse 69, temperature 98.2 F (36.8 C), temperature source Oral, resp. rate 15, height 5\' 4"  (1.626 m), weight 68 kg, SpO2 97%. Body mass index is 25.75 kg/m.   COGNITIVE FEATURES THAT CONTRIBUTE TO RISK:  Thought constriction (tunnel vision)    SUICIDE RISK:   Moderate:  Frequent suicidal ideation with limited intensity, and duration, some specificity in terms of plans, no associated intent, good self-control, limited dysphoria/symptomatology, some risk factors present, and identifiable protective factors, including available and accessible social support.  PLAN OF CARE:  Patient will be started on every 15 minute checks for suicide.  We will initiate medication to target mood symptoms.  I certify that inpatient services furnished can reasonably be expected to improve the patient's condition.   Amelie Jury, MD 10/22/2023,  1:32 PM

## 2023-10-23 ENCOUNTER — Encounter (HOSPITAL_COMMUNITY): Payer: Self-pay

## 2023-10-23 DIAGNOSIS — F333 Major depressive disorder, recurrent, severe with psychotic symptoms: Secondary | ICD-10-CM | POA: Diagnosis not present

## 2023-10-23 LAB — LIPID PANEL
Cholesterol: 123 mg/dL (ref 0–200)
HDL: 48 mg/dL (ref >40)
LDL Cholesterol: 64 mg/dL (ref 0–99)
Total CHOL/HDL Ratio: 2.6 ratio
Triglycerides: 56 mg/dL (ref <150)
VLDL: 11 mg/dL (ref 0–40)

## 2023-10-23 LAB — HEMOGLOBIN A1C
Hgb A1c MFr Bld: 5.4 % (ref 4.8–5.6)
Mean Plasma Glucose: 108.28 mg/dL

## 2023-10-23 MED ORDER — CLONIDINE HCL 0.1 MG PO TABS
0.1000 mg | ORAL_TABLET | Freq: Once | ORAL | Status: AC
  Administered 2023-10-24: 0.1 mg via ORAL
  Filled 2023-10-23 (×2): qty 1

## 2023-10-23 MED ORDER — CLONIDINE HCL 0.1 MG PO TABS
0.1000 mg | ORAL_TABLET | Freq: Once | ORAL | Status: DC
  Filled 2023-10-23 (×2): qty 1

## 2023-10-23 MED ORDER — ONDANSETRON 4 MG PO TBDP
4.0000 mg | ORAL_TABLET | Freq: Three times a day (TID) | ORAL | Status: DC | PRN
  Administered 2023-10-23 – 2023-10-24 (×2): 4 mg via ORAL
  Filled 2023-10-23 (×3): qty 1

## 2023-10-23 MED ORDER — ARIPIPRAZOLE 10 MG PO TABS
20.0000 mg | ORAL_TABLET | Freq: Every day | ORAL | Status: DC
  Administered 2023-10-24 – 2023-10-26 (×3): 20 mg via ORAL
  Filled 2023-10-23 (×5): qty 2

## 2023-10-23 NOTE — Progress Notes (Signed)
   10/22/23 2200  Psych Admission Type (Psych Patients Only)  Admission Status Voluntary  Psychosocial Assessment  Patient Complaints Anxiety;Depression  Eye Contact Fair  Facial Expression Sad  Affect Depressed;Sad  Speech Slurred (slurred speech is baseline)  Information systems manager Activity Unsteady;Slow  Appearance/Hygiene Disheveled  Behavior Characteristics Appropriate to situation  Mood Depressed  Thought Process  Coherency Circumstantial  Content Preoccupation  Delusions None reported or observed  Perception Hallucinations  Hallucination Auditory  Judgment Poor  Confusion None  Danger to Self  Current suicidal ideation? Denies  Agreement Not to Harm Self Yes  Description of Agreement Verbal Contract  Danger to Others  Danger to Others None reported or observed

## 2023-10-23 NOTE — Evaluation (Signed)
 Physical Therapy Evaluation Patient Details Name: Kaitlyn Gray MRN: [id number] DOB: 1976/04/04 Today's Date: 10/23/2023  History of Present Illness  Patient is a 48 year old female with a past medical history of hypertension, diabetes, prior CVA, schizoaffective, polysubstance use presenting to the emergency department 10/20/23  with neck pain as well as suicidal ideation. transfer to [facility] 10/21/23.  Clinical Impression    Patient reports that  she came with her rollator, RN found  patient's rollator in  belongings'  closet. Noted neither brake works. Patient currently ambulating  pushing Wc, patient did ambulate with  her rollator. Patient appears at her baseline, gait can be unsteady. Patient is noted to  be drowsy and at times, when conversing, eyes closing.  Patient  reports that she did not follow up with OPPT as recommended by PTduring  admission at [facility] in  1/25.  Unfortunately, patient's residual stroke deficits have been with patient for several years and most likely will not change or improve.  Per facility  protocol, patient can not have her rollator on the unit.  PT recommended to patient to ask staff to assist her with more frequent ambulation opportunities to improve strength and  balance.  No further PT needs  identified,  RN aware of  PT recommendations.      If plan is discharge home, recommend the following:  New rollator   Can travel by private Data processing manager (4 wheels) (patient's brakes are not working.)  Recommendations for Other Services       Functional Status Assessment Patient has not had a recent decline in their functional status     Precautions / Restrictions Precautions Precautions: Fall Restrictions Weight Bearing Restrictions Per Provider Order: No      Mobility  Bed Mobility Overal bed mobility: Independent                  Transfers Overall transfer level: Modified independent Equipment used: Rollator  (4 wheels) (WC)               General transfer comment: no assistance    Ambulation/Gait Ambulation/Gait assistance: Modified independent (Device/Increase time) Gait Distance (Feet): 100 Feet Assistive device: Rolling walker (2 wheels) (Wc) Gait Pattern/deviations: Step-through pattern Gait velocity: decreased speed     General Gait Details: noted   trunk tends to hyper extend when standing  Stairs            Wheelchair Mobility     Tilt Bed    Modified Rankin (Stroke Patients Only)       Balance Overall balance assessment: Mild deficits observed, not formally tested                                           Pertinent Vitals/Pain Pain Assessment Pain Assessment: No/denies pain    Home Living Family/patient expects to be discharged to:: Private residence Living Arrangements: Alone   Type of Home: Apartment Home Access: Stairs to enter Entrance Stairs-Rails: Right Entrance Stairs-Number of Steps: 6  down     Home Equipment: Rollator (4 wheels) Additional Comments: rollator is broken. reports no family or friends available to provide assist    Prior Function               Mobility Comments: ambulatory with rollator, sometimes sits on it to use like w/c when legs are  weak; h/o falls ADLs Comments: reports difficulty caring for herself     Extremity/Trunk Assessment   Upper Extremity Assessment Upper Extremity Assessment: Right hand dominant;Generalized weakness    Lower Extremity Assessment Lower Extremity Assessment: Generalized weakness (movements ar slow)    Cervical / Trunk Assessment Cervical / Trunk Assessment: Other exceptions Cervical / Trunk Exceptions: trunk rotated  Communication   Communication Communication:  (dysarthria)    Cognition Arousal: Suspect due to medications (drowsy) Behavior During Therapy: WFL for tasks assessed/performed   PT - Cognitive impairments: No apparent impairments                          Following commands: Intact       Cueing       General Comments      Exercises General Exercises - Lower Extremity Long Arc Quad: AROM, Both Other Exercises Other Exercises: instructed in standing  small  knee bends with UE support   Assessment/Plan    PT Assessment Patient does not need any further PT services  PT Problem List         PT Treatment Interventions      PT Goals (Current goals can be found in the Care Plan section)  Acute Rehab PT Goals Patient Stated Goal: going to  drug rehab PT Goal Formulation: All assessment and education complete, DC therapy    Frequency       Co-evaluation               AM-PAC PT "6 Clicks" Mobility  Outcome Measure Help needed turning from your back to your side while in a flat bed without using bedrails?: None Help needed moving from lying on your back to sitting on the side of a flat bed without using bedrails?: None Help needed moving to and from a bed to a chair (including a wheelchair)?: None Help needed standing up from a chair using your arms (e.g., wheelchair or bedside chair)?: None Help needed to walk in hospital room?: None Help needed climbing 3-5 steps with a railing? : A Little 6 Click Score: 23    End of Session   Activity Tolerance: Patient tolerated treatment well Patient left: in bed Nurse Communication: Mobility status PT Visit Diagnosis: Unsteadiness on feet (R26.81);Other symptoms and signs involving the nervous system (R29.898)    Time: 1610-9604 PT Time Calculation (min) (ACUTE ONLY): 23 min   Charges:   PT Evaluation $PT Eval Low Complexity: 1 Low PT Treatments $Gait Training: 8-22 mins PT General Charges $$ ACUTE PT VISIT: 1 Visit         Abelina Hoes PT Acute Rehabilitation Services Office [phone] Weekend pager-[phone]   Dareen Ebbing 10/23/2023, 3:13 PM

## 2023-10-23 NOTE — Progress Notes (Signed)
 Pt is not safety compliant. She was continuously educated on how to safely use walker or wheelchair. She is forgetful and sometimes forgets to put the wheelchair brakes on and she does not appropriately hold the walker and becomes increasingly agitated when corrected.    10/22/23 2200  Precautions / Armbands  Precautions Other (Comment) (Safety q15 minute checks)  Patient armbands applied: Patient Identification Kaitlyn Gray)  [facility] Fall Risk Assessment  Risk Factor Category (scoring not indicated) Nursing clinical judgement (High fall risk)  Patient Fall Risk Level High fall risk  Required Bundle Interventions *See Row Information* High fall risk - low and high requirements implemented  Fall intervention(s) refused/Patient educated regarding refusal Nonskid socks;Yellow bracelet  Screening for Fall Injury Risk (To be completed on HIGH fall risk patients) - Assessing Need for Floor Mats  Risk For Fall Injury- Criteria for Floor Mats None identified - No additional interventions needed  Safety Interventions  Less Restrictive Interventions Active listening;Frequent verbal contacts;Observation;Pain/Anxiety management;Provide reassurance

## 2023-10-23 NOTE — Progress Notes (Signed)
 Patient claims that she was sick to her stomach and vomiting. No vomit was seen or observed. Pt also stated that she was getting very agitated because she was hearing voices and that's why she was getting sick to her stomach. Pt was given PRN Haldol  and Benadryl , as well as Maloxx and is currently sleeping. Will continue to monitor.

## 2023-10-23 NOTE — Progress Notes (Signed)
 Pt vomited again and MD notified. Vital were taken, BP elevated. See flowsheet. Zofran  and Clonidine  were ordered.

## 2023-10-23 NOTE — [facility] Suicide Risk Assessment (Signed)
 [facility] INPATIENT:  Family/Significant Other Suicide Prevention Education  Suicide Prevention Education:  Patient Refusal for Family/Significant Other Suicide Prevention Education: The patient Kaitlyn Gray has refused to provide written consent for family/significant other to be provided Family/Significant Other Suicide Prevention Education during admission and/or prior to discharge.  Physician notified.  Pt reports not having anyone in her life for CSW to contact. Will complete SPE with patient at discharge.  Vonzell Guerin 10/23/2023, 11:33 AM

## 2023-10-23 NOTE — Progress Notes (Signed)
  [place]   Type of Note: Substance use treatment   [facility] - [phone] - Spoke with Moira Andrews in admissions, wheelchairs & walkers are allowed as long as patient can complete ADL's on their own and they are listed as PRN use on referral.  Referral to be faxed next week after PT and OT consult.  [facility] - [phone] - Spoke with [facility] in admissions, walkers/wheelchairs are allowed as long as patient can complete ADL's on their own.  Referral to be faxed next week after PT and OT consult.   [facility] [place]) [phone] - Wheelchairs/walkers are not allowed, residential home has stairs.  [facility] [facility] [place]) [phone] - Left voicemail for Haskell Linker in admissions re: walker/wheelchairs.  [facility] [place]) [phone] - Left voicemail for Penni Bowman. in admissions re: walkers/wheelchairs.  Will continue to assist.  Signed:  Allis Quirarte, LCSW-A 10/23/2023  10:04 AM
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
## 2023-10-23 NOTE — Plan of Care (Signed)
   Problem: Education: Goal: Knowledge of [facility] Education information/materials will improve Outcome: Progressing Goal: Mental status will improve Outcome: Progressing Goal: Verbalization of understanding the information provided will improve Outcome: Progressing   Problem: Education: Goal: Emotional status will improve Outcome: Not Progressing   Problem: Activity: Goal: Interest or engagement in activities will improve Outcome: Not Progressing

## 2023-10-23 NOTE — Plan of Care (Signed)
  Problem: Education: Goal: Emotional status will improve Outcome: Progressing Goal: Mental status will improve Outcome: Progressing Goal: Verbalization of understanding the information provided will improve Outcome: Progressing   Problem: Safety: Goal: Periods of time without injury will increase Outcome: Progressing   

## 2023-10-23 NOTE — Progress Notes (Signed)
 Pt had a large vomit which was 5 min after meds were taken. Pt was given a new dose of Zofran  disintegrating. Pt had swallowed the first dose. Stayed with pt while second dose melted.Will continue to monitor.

## 2023-10-23 NOTE — Progress Notes (Signed)
 Writer was made aware that the cafeteria made spaghetti for dinner. 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 Writer asked pt why they would eat the spaghetti if they were allergic to tomatoes. Pt responded "I don't know". RN notified.

## 2023-10-23 NOTE — Group Note (Signed)
 Date:  10/23/2023 Time:  9:28 AM  Group Topic/Focus:  Goals Group:   The focus of this group is to help patients establish daily goals to achieve during treatment and discuss how the patient can incorporate goal setting into their daily lives to aide in recovery. Orientation:   The focus of this group is to educate the patient on the purpose and policies of crisis stabilization and provide a format to answer questions about their admission.  The group details unit policies and expectations of patients while admitted.    Participation Level:  Did Not Attend   Sheryl Donna 10/23/2023, 9:28 AM

## 2023-10-23 NOTE — Group Note (Signed)
 Date:  10/23/2023 Time:  2:47 PM  Group Topic/Focus:  Healthy Support Systems:   The focus of this group is to discuss boundaries and healthy ways to communicate them.    Participation Level:  Did Not Attend   Sheryl Donna 10/23/2023, 2:47 PM

## 2023-10-23 NOTE — Evaluation (Signed)
 Occupational Therapy Evaluation Patient Details Name: Kaitlyn Gray MRN: [id number] DOB: July 20, 1975 Today's Date: 10/23/2023   History of Present Illness   Patient is a 48 year old female with a past medical history of hypertension, diabetes, prior CVA, schizoaffective, polysubstance use presenting to the emergency department 10/20/23  with neck pain as well as suicidal ideation. transfer to [facility] 10/21/23.     Clinical Impressions OT met w/ pt today to perform assessment to assist w/ DC recommendations and Tx. OT found pt resting in bed. She continues to present lethargic / somnolent, however less than yesterday. Pt states she is withdrawing from crack and heroin and believes a fair amount of her current dysfunction is related to this experience. She also admits her Hx of stroke has significantly impaired her L UE/LE that interferes w/ ADLs and mobility. OT will plan to perform formal cognitive testing in the days to come as currently she still has some improvements to make in terms of somnolence and lethargy. As she presents today, she will likely require a higher level of are than she has access to at her previous living situation. OT will remain on caseload to address bADLs and cognition for 2x's/wk      If plan is discharge home, recommend the following:   Other (comment) (defer s/p cognitive testing results)     Functional Status Assessment   Patient has had a recent decline in their functional status and/or demonstrates limited ability to make significant improvements in function in a reasonable and predictable amount of time     Equipment Recommendations         Recommendations for Other Services         Precautions/Restrictions   Precautions Precautions: Fall Restrictions Weight Bearing Restrictions Per Provider Order: No     Mobility Bed Mobility                    Transfers                          Balance                                            ADL either performed or assessed with clinical judgement   ADL Overall ADL's : At baseline                                       General ADL Comments: decreased independence since Hx of stroke / L UE difficulties w/ FMC and some GMC based tasks     Vision         Perception         Praxis         Pertinent Vitals/Pain Pain Assessment Pain Assessment: No/denies pain     Extremity/Trunk Assessment             Communication     Cognition Arousal: Lethargic Behavior During Therapy: WFL for tasks assessed/performed Cognition: History of cognitive impairments             OT - Cognition Comments: OT will perform formal cognitive assessment in the days to come / OT suspects at least mild CI  Following commands: Intact       Cueing  General Comments          Exercises     Shoulder Instructions      Home Living Family/patient expects to be discharged to:: Private residence Living Arrangements: Alone   Type of Home: Apartment     Entrance Stairs-Rails: Right Home Layout: Two level               Home Equipment: Rollator (4 wheels)          Prior Functioning/Environment Prior Level of Function : Needs assist       Physical Assist : Mobility (physical);ADLs (physical)     Mobility Comments: relies on rollator for ambulation at baseline / Hx of falls / ADLs Comments: at baseline for ADLs / SU / Min A when she can get assist    OT Problem List: Decreased strength;Decreased range of motion;Decreased activity tolerance;Decreased cognition;Impaired UE functional use;Decreased coordination   OT Treatment/Interventions: Self-care/ADL training;Cognitive remediation/compensation      OT Goals(Current goals can be found in the care plan section)       OT Frequency:  Min 2X/week    Co-evaluation              AM-PAC OT "6 Clicks" Daily Activity     Outcome  Measure Help from another person eating meals?: None Help from another person taking care of personal grooming?: A Little Help from another person toileting, which includes using toliet, bedpan, or urinal?: None Help from another person bathing (including washing, rinsing, drying)?: A Little Help from another person to put on and taking off regular upper body clothing?: None Help from another person to put on and taking off regular lower body clothing?: A Little 6 Click Score: 21   End of Session    Activity Tolerance: Patient limited by fatigue;Patient limited by lethargy Patient left: in bed  OT Visit Diagnosis: Muscle weakness (generalized) (M62.81);Other symptoms and signs involving cognitive function                Time: 1610-9604 OT Time Calculation (min): 28 min Charges:  OT General Charges $OT Visit: 1 Visit OT Evaluation $OT Eval Low Complexity: 1 Low OT Treatments $Self Care/Home Management : 8-22 mins  Rockne Chyle, OT   Lynnda Sas 10/23/2023, 10:33 PM

## 2023-10-23 NOTE — Progress Notes (Signed)
 [facility] MD Progress Note  10/23/2023 11:09 AM Kaitlyn Gray  MRN:  [id number] Subjective:   48 year old African American female, single, unemployed, on SSI disability, lives alone. Background history of MDD recurrent, substance use disorder, substance-induced mood disorder and multiple medical comorbidities. Presented to the emergency room on account of pain. Intoxicated with cocaine at presentation. Reported auditory and visual hallucinations. Voices were telling her to kill herself. Voluntary admission to seek help.  Chart reviewed today.  Patient discussed at multidisciplinary team meeting.  Nursing staff reports that patient slept for 11.25 hours.  She still has episodes of responding to internal stimuli.  She required agitation protocol orally yesterday.  She reports hearing voices in her head.  OT and PT we will hopefully review her today and give us  their input.  Seen today.  More pleasant and more engaging today.  States that she feels better compared to yesterday.  The voices are not as intense as they used to be.  Patient states that she is trying to catch up with missed sleep while under the influence.  States that she will participate with unit groups and therapeutic activities when she is feeling better.  Patient states that she is able to mobilize without a wheelchair.  States that her goal is to go to an addiction treatment facility.  No current suicidal thoughts.  No homicidal thoughts.  No thoughts of violence.  Patient is not endorsing any delusions.  No side effects from her medicine. Encouraged to keep ventilating her feelings to staff.    Principal Problem: MDD (major depressive disorder), recurrent, severe, with psychosis (HCC) Diagnosis: Principal Problem:   MDD (major depressive disorder), recurrent, severe, with psychosis (HCC) Active Problems:   Psychoactive substance-induced organic mood disorder (HCC)  Total Time spent with patient: 20 minutes  Past Psychiatric  History:  Extensive history of substance use disorder.  Drug of choice is opioids, cocaine and THC.  History of major depressive disorder recurrent and substance-induced psychotic/mood disorder.  Patient has had over half a dozen hospitalizations in the past.  She used to be on Suboxone .  No past inpatient rehab.  She used to be on Suboxone . Patient has multiple self-injurious behavior in context of being intoxicated with psychoactive substances.  She has a history of being violent while under the influence.  She has fleeting psychotic and manic symptoms while under the influence. Patient did well with the Abilify  maintainer.  No recollection of other medicines that she has tried in the past.  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) 07/19/2017   Hypertension    Hypertensive emergency 05/31/2022   Ischemic stroke (HCC) 01/01/2020   subacute right middle cerebellar peduncle and pons infarction   Left-sided weakness 01/27/2022   Head CT with remote right occipital infarct which is consistent with old left-sided weakness   MDD (major depressive disorder), recurrent severe, 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   Normocytic anemia 02/09/2020   Opiate use  History of Suboxone  Therapy until 05/2021   Polysubstance abuse (HCC) 07/19/2017   Prescription drug misadventures (Seroquel )     Past Surgical History:  Procedure Laterality Date   CESAREAN SECTION      Family History:  Family History  Problem Relation Age of Onset   Hypertension Mother    CAD Mother 78       died of MI at age 95   Hypertension Father    Family Psychiatric  History:  Family history of addiction. No family history of suicide.   Social History:  Social History   Substance and Sexual Activity  Alcohol Use Not Currently     Social History   Substance and Sexual Activity  Drug Use Yes   Types: Marijuana, Cocaine, "Crack" cocaine   Comment: occassionally    Social History   Socioeconomic History   Marital status: Single    Spouse name: Not on file   Number of children: Not on file   Years of education: Not on file   Highest education level: Not on file  Occupational History   Not on file  Tobacco Use   Smoking status: Every Day    Types: Cigarettes    Start date: 06/16/1998    Passive exposure: Current   Smokeless tobacco: Former  Building services engineer status: Every Day  Substance and Sexual Activity   Alcohol use: Not Currently   Drug use: Yes    Types: Marijuana, Cocaine, "Crack" cocaine    Comment: occassionally   Sexual activity: Never    Birth control/protection: None  Other Topics Concern   Not on file  Social History Narrative   ** Merged History Encounter **       Social Drivers of Health   Financial Resource Strain: Not on file  Food Insecurity: Food Insecurity Present (10/21/2023)   Hunger Vital Sign    Worried About Running Out of Food in the Last Year: Sometimes true    Ran Out of Food in the Last Year: Sometimes true  Transportation Needs: No Transportation Needs (10/21/2023)   PRAPARE - Administrator, Civil Service (Medical): No    Lack of Transportation (Non-Medical): No  Recent Concern: Transportation Needs - Unmet Transportation Needs (09/17/2023)   PRAPARE - Administrator, Civil Service (Medical): Yes    Lack of Transportation (Non-Medical): Yes  Physical Activity: Not on file  Stress: Not on file   Social Connections: Not on file   Additional Social History:  Patient was raised by her grandmother as her mother was not emotionally available.  Her mother suffered from substance use disorder.  Her grandmother was murdered in 68.  Patient reports delayed developmental milestones.  She had special education.  She was kicked out in 12 th grade for fighting.  She has been incarcerated a couple of times for fighting.  She reports illicit means of sustaining her habit.  Patient is on SSI disability.  She lives alone.  No contact with her two children who are in foster care.  Limited support in the community   Current Medications: Current Facility-Administered Medications  Medication Dose Route Frequency Provider Last Rate Last Admin   acetaminophen  (TYLENOL ) tablet 650 mg  650 mg Oral Q6H PRN Volanda Gruber, NP   650 mg at 10/21/23 2209   alum & mag hydroxide-simeth (MAALOX/MYLANTA) 200-200-20 MG/5ML suspension 30 mL  30 mL Oral Q4H PRN Volanda Gruber, NP       Cecily Cohen  ON 10/24/2023] ARIPiprazole  (ABILIFY ) tablet 20 mg  20 mg Oral Daily Sheilla Maris, Iline Mallory, MD       haloperidol  (HALDOL ) tablet 5 mg  5 mg Oral TID PRN Volanda Gruber, NP   5 mg at 10/22/23 1250   And   diphenhydrAMINE  (BENADRYL ) capsule 50 mg  50 mg Oral TID PRN Volanda Gruber, NP   50 mg at 10/22/23 1250   haloperidol  lactate (HALDOL ) injection 5 mg  5 mg Intramuscular TID PRN Volanda Gruber, NP       And   diphenhydrAMINE  (BENADRYL ) injection 50 mg  50 mg Intramuscular TID PRN Volanda Gruber, NP       And   LORazepam  (ATIVAN ) injection 2 mg  2 mg Intramuscular TID PRN Volanda Gruber, NP       haloperidol  lactate (HALDOL ) injection 10 mg  10 mg Intramuscular TID PRN Volanda Gruber, NP       And   diphenhydrAMINE  (BENADRYL ) injection 50 mg  50 mg Intramuscular TID PRN Volanda Gruber, NP       And   LORazepam  (ATIVAN ) injection 2 mg  2 mg Intramuscular TID PRN Volanda Gruber, NP       ferrous sulfate  tablet  325 mg  325 mg Oral QODAY Stevens, Terry J, NP   325 mg at 10/23/23 4098   gabapentin  (NEURONTIN ) capsule 100 mg  100 mg Oral TID Volanda Gruber, NP   100 mg at 10/23/23 0941   losartan  (COZAAR ) tablet 100 mg  100 mg Oral Daily Volanda Gruber, NP   100 mg at 10/23/23 1191   magnesium  hydroxide (MILK OF MAGNESIA) suspension 30 mL  30 mL Oral Daily PRN Volanda Gruber, NP       metFORMIN  (GLUCOPHAGE ) tablet 500 mg  500 mg Oral BID WC Volanda Gruber, NP   500 mg at 10/23/23 0941   multivitamin with minerals tablet 1 tablet  1 tablet Oral Daily Volanda Gruber, NP   1 tablet at 10/23/23 0941   nicotine  (NICODERM CQ  - dosed in mg/24 hours) patch 21 mg  21 mg Transdermal Q0600 Volanda Gruber, NP       QUEtiapine  (SEROQUEL ) tablet 25 mg  25 mg Oral QHS Tashianna Broome, Iline Mallory, MD   25 mg at 10/22/23 2203   rosuvastatin  (CRESTOR ) tablet 40 mg  40 mg Oral Daily Volanda Gruber, NP   40 mg at 10/23/23 4782    Lab Results:  Results for orders placed or performed during the hospital encounter of 10/21/23 (from the past 48 hours)  Hemoglobin A1c     Status: None   Collection Time: 10/23/23  6:29 AM  Result Value Ref Range   Hgb A1c MFr Bld 5.4 4.8 - 5.6 %    Comment: (NOTE) Pre diabetes:          5.7%-6.4%  Diabetes:              >6.4%  Glycemic control for   <7.0% adults with diabetes    Mean Plasma Glucose 108.28 mg/dL    Comment: Performed at [facility], [address] [place], [place] [address]  Lipid panel     Status: None   Collection Time: 10/23/23  6:29 AM  Result Value Ref Range   Cholesterol 123 0 - 200 mg/dL   Triglycerides 56 <308 mg/dL   HDL 48 >65 mg/dL   Total CHOL/HDL Ratio 2.6 RATIO  VLDL 11 0 - 40 mg/dL   LDL Cholesterol 64 0 - 99 mg/dL    Comment:        Total Cholesterol/HDL:CHD Risk Coronary Heart Disease Risk Table                     Men   Women  1/2 Average Risk   3.4   3.3  Average Risk       5.0   4.4  2 X Average Risk   9.6   7.1  3 X Average  Risk  23.4   11.0        Use the calculated Patient Ratio above and the CHD Risk Table to determine the patient's CHD Risk.        ATP III CLASSIFICATION (LDL):  <100     mg/dL   Optimal  960-454  mg/dL   Near or Above                    Optimal  130-159  mg/dL   Borderline  098-119  mg/dL   High  >147     mg/dL   Very High Performed at [facility], [address] [place], [place] [address]     Blood Alcohol level:  Lab Results  Component Value Date   [facility] <15 10/20/2023   ETH <10 07/04/2023    Metabolic Disorder Labs: Lab Results  Component Value Date   HGBA1C 5.4 10/23/2023   MPG 108.28 10/23/2023   MPG 134.11 12/23/2022   Lab Results  Component Value Date   PROLACTIN 20.7 12/23/2022   Lab Results  Component Value Date   CHOL 123 10/23/2023   TRIG 56 10/23/2023   HDL 48 10/23/2023   CHOLHDL 2.6 10/23/2023   VLDL 11 10/23/2023   LDLCALC 64 10/23/2023   LDLCALC 95 12/23/2022    Physical Findings: AIMS:  , ,  ,  ,    CIWA:    COWS:     Musculoskeletal: Strength & Muscle Tone: Unable to assess today Gait & Station: Unable to assess today Patient leans: N/A  Psychiatric Specialty Exam:  Presentation  General Appearance and behavior:  Poor grooming, in bed, not in any distress, no drooling today.  Eye Contact: Better   Speech: More spontaneous and clearer.  Not pressured or loud.  Mood and Affect  Mood: Subjectively and objectively better.   Affect: Restricted and appropriate.   Thought Process  Thought Processes: Linear and goal directed.   Descriptions of Associations:Intact   Orientation:Full (Time, Place and Person)   Thought Content: Focused on getting into rehab.  Future-oriented.  No current suicidal thoughts.  No homicidal thoughts.  No thoughts of violence.  No negative ruminative flooding.  No guilty ruminations.  No delusional theme.  No obsessions.   Hallucinations: Fleeting auditory and visual  hallucination.   Sensorium  Memory: Poor.   Judgment: Better   Insight: Good as she is willing to take medication and go to rehab.   Executive Functions  Concentration: Limited.   Attention Span: Distractible.   Recall: Poor.   Fund of Knowledge: Limited.   Language: Fair.   Psychomotor Activity  Decreased psychomotor activity   Physical Exam: Physical Exam ROS Blood pressure (!) 173/96, pulse 76, temperature 98.6 F (37 C), temperature source Oral, resp. rate 16, height 5\' 4"  (1.626 m), weight 68 kg, SpO2 98%. Body mass index is 25.75 kg/m.   Treatment Plan Summary: Patient is  gradually coming off psychoactive substances.  She is more engaging today.  She still has residual psychotic symptoms.  We will optimize aripiprazole  as below.  Patient will get PT and OT evaluation soon.  We will gather collateral and evaluate her further.  1.  Increase aripiprazole  to 20 mg daily. 2.  Continue quetiapine  25 mg at bedtime. 3.  Continue gabapentin  100 mg 3 times daily 4.  Continue home medical medications at the same dose. 5.  We will encourage unit groups and therapeutic activities as she gets better. 6.  Social worker will gather collateral from family/caregivers in the community. 7.  Social worker will coordinate discharge and aftercare planning.   Amelie Jury, MD 10/23/2023, 11:09 AM

## 2023-10-23 NOTE — Progress Notes (Signed)
   10/23/23 1412  Psych Admission Type (Psych Patients Only)  Admission Status Voluntary  Psychosocial Assessment  Patient Complaints Depression;Hopelessness  Eye Contact Fair  Facial Expression Sad  Affect Depressed  Speech Aphasic  Interaction Guarded  Motor Activity Slow;Unsteady;Shuffling  Appearance/Hygiene Disheveled  Behavior Characteristics Cooperative  Mood Depressed  Thought Process  Coherency Circumstantial  Content Preoccupation  Delusions None reported or observed  Perception WDL  Hallucination None reported or observed  Judgment Poor  Confusion None  Danger to Self  Current suicidal ideation? Passive  Self-Injurious Behavior Some self-injurious ideation observed or expressed.  No lethal plan expressed   Agreement Not to Harm Self Yes  Description of Agreement Verbal  Danger to Others  Danger to Others None reported or observed

## 2023-10-23 NOTE — Progress Notes (Signed)
 PT Cancellation Note  Patient Details Name: Kaitlyn Gray MRN: [id number] DOB: [birth year]   Cancelled Treatment:    Reason Eval/Treat Not Completed: Other (comment) PT eval  planned today.  Abelina Hoes PT Acute Rehabilitation Services Office [phone] Weekend [phone]   Dareen Ebbing 10/23/2023, 11:01 AM

## 2023-10-23 NOTE — BH IP Treatment Plan (Signed)
 Interdisciplinary Treatment and Diagnostic Plan Update  10/23/2023 Time of Session: 10:15 AM Kaitlyn Gray MRN: [id number]  Principal Diagnosis: MDD (major depressive disorder), recurrent, severe, with psychosis (HCC)  Secondary Diagnoses: Principal Problem:   MDD (major depressive disorder), recurrent, severe, with psychosis (HCC) Active Problems:   Psychoactive substance-induced organic mood disorder (HCC)   Current Medications:  Current Facility-Administered Medications  Medication Dose Route Frequency Provider Last Rate Last Admin   acetaminophen  (TYLENOL ) tablet 650 mg  650 mg Oral Q6H PRN Volanda Gruber, NP   650 mg at 10/21/23 2209   alum & mag hydroxide-simeth (MAALOX/MYLANTA) 200-200-20 MG/5ML suspension 30 mL  30 mL Oral Q4H PRN Volanda Gruber, NP       [START ON 10/24/2023] ARIPiprazole  (ABILIFY ) tablet 20 mg  20 mg Oral Daily Izediuno, Iline Mallory, MD       haloperidol  (HALDOL ) tablet 5 mg  5 mg Oral TID PRN Volanda Gruber, NP   5 mg at 10/22/23 1250   And   diphenhydrAMINE  (BENADRYL ) capsule 50 mg  50 mg Oral TID PRN Volanda Gruber, NP   50 mg at 10/22/23 1250   haloperidol  lactate (HALDOL ) injection 5 mg  5 mg Intramuscular TID PRN Volanda Gruber, NP       And   diphenhydrAMINE  (BENADRYL ) injection 50 mg  50 mg Intramuscular TID PRN Volanda Gruber, NP       And   LORazepam  (ATIVAN ) injection 2 mg  2 mg Intramuscular TID PRN Volanda Gruber, NP       haloperidol  lactate (HALDOL ) injection 10 mg  10 mg Intramuscular TID PRN Volanda Gruber, NP       And   diphenhydrAMINE  (BENADRYL ) injection 50 mg  50 mg Intramuscular TID PRN Volanda Gruber, NP       And   LORazepam  (ATIVAN ) injection 2 mg  2 mg Intramuscular TID PRN Volanda Gruber, NP       ferrous sulfate  tablet 325 mg  325 mg Oral QODAY Stevens, Terry J, NP   325 mg at 10/23/23 0941   gabapentin  (NEURONTIN ) capsule 100 mg  100 mg Oral TID Volanda Gruber, NP   100 mg at 10/23/23 1651   losartan   (COZAAR ) tablet 100 mg  100 mg Oral Daily Volanda Gruber, NP   100 mg at 10/23/23 0941   magnesium  hydroxide (MILK OF MAGNESIA) suspension 30 mL  30 mL Oral Daily PRN Volanda Gruber, NP       metFORMIN  (GLUCOPHAGE ) tablet 500 mg  500 mg Oral BID WC Volanda Gruber, NP   500 mg at 10/23/23 1651   multivitamin with minerals tablet 1 tablet  1 tablet Oral Daily Volanda Gruber, NP   1 tablet at 10/23/23 0941   nicotine  (NICODERM CQ  - dosed in mg/24 hours) patch 21 mg  21 mg Transdermal Q0600 Volanda Gruber, NP       QUEtiapine  (SEROQUEL ) tablet 25 mg  25 mg Oral QHS Izediuno, Vincent A, MD   25 mg at 10/22/23 2203   rosuvastatin  (CRESTOR ) tablet 40 mg  40 mg Oral Daily Volanda Gruber, NP   40 mg at 10/23/23 0941   PTA Medications: Medications Prior to Admission  Medication Sig Dispense Refill Last Dose/Taking   losartan  (COZAAR ) 100 MG tablet Take 1 tablet (100 mg total) by mouth daily. (Patient not taking: Reported on 10/22/2023) 90 tablet 2 Not Taking   metFORMIN  (GLUCOPHAGE ) 500 MG  tablet Take 1 tablet (500 mg total) by mouth 2 (two) times daily with a meal. (Patient not taking: Reported on 10/22/2023) 60 tablet 2 Not Taking   rosuvastatin  (CRESTOR ) 40 MG tablet Take 1 tablet (40 mg total) by mouth daily. (Patient not taking: Reported on 10/22/2023) 30 tablet 2 Not Taking    Patient Stressors: Health problems   Substance abuse    Patient Strengths: Ability for insight   Treatment Modalities: Medication Management, Group therapy, Case management,  1 to 1 session with clinician, Psychoeducation, Recreational therapy.   Physician Treatment Plan for Primary Diagnosis: MDD (major depressive disorder), recurrent, severe, with psychosis (HCC) Long Term Goal(s): Improvement in symptoms so as ready for discharge   Short Term Goals: Maintenance of sobriety Ability to identify changes in lifestyle to reduce recurrence of condition will improve  Medication Management: Evaluate patient's  response, side effects, and tolerance of medication regimen.  Therapeutic Interventions: 1 to 1 sessions, Unit Group sessions and Medication administration.  Evaluation of Outcomes: Not Progressing  Physician Treatment Plan for Secondary Diagnosis: Principal Problem:   MDD (major depressive disorder), recurrent, severe, with psychosis (HCC) Active Problems:   Psychoactive substance-induced organic mood disorder (HCC)  Long Term Goal(s): Improvement in symptoms so as ready for discharge   Short Term Goals: Maintenance of sobriety Ability to identify changes in lifestyle to reduce recurrence of condition will improve     Medication Management: Evaluate patient's response, side effects, and tolerance of medication regimen.  Therapeutic Interventions: 1 to 1 sessions, Unit Group sessions and Medication administration.  Evaluation of Outcomes: Not Progressing   RN Treatment Plan for Primary Diagnosis: MDD (major depressive disorder), recurrent, severe, with psychosis (HCC) Long Term Goal(s): Knowledge of disease and therapeutic regimen to maintain health will improve  Short Term Goals: Ability to remain free from injury will improve, Ability to verbalize frustration and anger appropriately will improve, Ability to demonstrate self-control, Ability to participate in decision making will improve, Ability to verbalize feelings will improve, Ability to disclose and discuss suicidal ideas, Ability to identify and develop effective coping behaviors will improve, and Compliance with prescribed medications will improve  Medication Management: RN will administer medications as ordered by provider, will assess and evaluate patient's response and provide education to patient for prescribed medication. RN will report any adverse and/or side effects to prescribing provider.  Therapeutic Interventions: 1 on 1 counseling sessions, Psychoeducation, Medication administration, Evaluate responses to treatment,  Monitor vital signs and CBGs as ordered, Perform/monitor CIWA, COWS, AIMS and Fall Risk screenings as ordered, Perform wound care treatments as ordered.  Evaluation of Outcomes: Not Progressing   LCSW Treatment Plan for Primary Diagnosis: MDD (major depressive disorder), recurrent, severe, with psychosis (HCC) Long Term Goal(s): Safe transition to appropriate next level of care at discharge, Engage patient in therapeutic group addressing interpersonal concerns.  Short Term Goals: Engage patient in aftercare planning with referrals and resources, Increase social support, Increase ability to appropriately verbalize feelings, Increase emotional regulation, Facilitate acceptance of mental health diagnosis and concerns, Facilitate patient progression through stages of change regarding substance use diagnoses and concerns, Identify triggers associated with mental health/substance abuse issues, and Increase skills for wellness and recovery  Therapeutic Interventions: Assess for all discharge needs, 1 to 1 time with Social worker, Explore available resources and support systems, Assess for adequacy in community support network, Educate family and significant other(s) on suicide prevention, Complete Psychosocial Assessment, Interpersonal group therapy.  Evaluation of Outcomes: Not Progressing   Progress in Treatment:  Attending groups: No. Participating in groups: No. Taking medication as prescribed: Yes. Toleration medication: Yes. Family/Significant other contact made: consents are pending Patient understands diagnosis: Yes. Discussing patient identified problems/goals with staff: Yes. Medical problems stabilized or resolved: Yes. Denies suicidal/homicidal ideation: Yes. Issues/concerns per patient self-inventory: No.  New problem(s) identified: No  New Short Term/Long Term Goal(s):    medication stabilization, elimination of SI thoughts, development of comprehensive mental wellness plan.    Patient Goals:  "I want to work on my mental health and my drug use."    Discharge Plan or Barriers:  Patient recently admitted. CSW will continue to follow and assess for appropriate referrals and possible discharge planning.     Reason for Continuation of Hospitalization: Depression Medication stabilization Suicidal ideation  Estimated Length of Stay:  5 - 7 days  Last 3 [place] Suicide Severity Risk Score: Flowsheet Row Ad[facility] (Current) [facility]/20[facility] ED from 09/28/2023 in [facility] Department at [facility]  C-SSRS RISK CATEGORY No Risk No Risk No Risk       Last PHQ 2/9 Scores:    07/17/2022    1:34 PM 12/12/2021    5:37 PM 07/15/2017    7:12 PM  Depression screen PHQ 2/9  Decreased Interest  1 2  Down, Depressed, Hopeless 2 1 2   PHQ - 2 Score 2 2 4   Altered sleeping 3 2 1   Tired, decreased energy 3 2 1   Change in appetite 3 1 1   Feeling bad or failure about yourself  3 2 1   Trouble concentrating 3 1 1   Moving slowly or fidgety/restless 3 1 0  Suicidal thoughts 1 1 1   PHQ-9 Score 21 12 10   Difficult doing work/chores Extremely dIfficult Somewhat difficult Somewhat difficult    Scribe for Treatment Team: Danijah Noh O Delance Weide, LCSWA 10/23/2023 5:05 PM

## 2023-10-24 DIAGNOSIS — F333 Major depressive disorder, recurrent, severe with psychotic symptoms: Secondary | ICD-10-CM | POA: Diagnosis not present

## 2023-10-24 MED ORDER — ASPIRIN 81 MG PO CHEW
81.0000 mg | CHEWABLE_TABLET | Freq: Once | ORAL | Status: AC
  Filled 2023-10-24: qty 1

## 2023-10-24 MED ORDER — ASPIRIN 81 MG PO CHEW
CHEWABLE_TABLET | ORAL | Status: AC
  Administered 2023-10-24: 81 mg via ORAL
  Filled 2023-10-24: qty 1

## 2023-10-24 MED ORDER — ARIPIPRAZOLE ER 400 MG IM SRER
300.0000 mg | INTRAMUSCULAR | Status: DC
  Administered 2023-10-24: 300 mg via INTRAMUSCULAR
  Filled 2023-10-24: qty 2

## 2023-10-24 NOTE — Progress Notes (Signed)
 Pt presents with depressed and irritable affect. Denies SI, HI, AVH, and pain. Pt continues to be non compliant with fall safety and refuses to sit in the wheelchair or use the front wheel walker to assist with ambulation. Pt ambulates and pushes wheelchair in front of her despite multiple instances of safety education. Pt did not have any occurrence of emesis this shift. Pt refused afternoon dose of gabapentin  but was compliant with all other medications with no adverse reactions. Safety checks maintained at q 15 minutes.

## 2023-10-24 NOTE — Plan of Care (Signed)
   Problem: Education: Goal: Mental status will improve Outcome: Not Progressing

## 2023-10-24 NOTE — Plan of Care (Signed)
  Problem: Education: Goal: Emotional status will improve Outcome: Progressing Goal: Mental status will improve Outcome: Progressing Goal: Verbalization of understanding the information provided will improve Outcome: Progressing   Problem: Activity: Goal: Interest or engagement in activities will improve Outcome: Progressing   Problem: Safety: Goal: Periods of time without injury will increase Outcome: Progressing

## 2023-10-24 NOTE — Progress Notes (Signed)
 Pt disclosed that she had eaten spaghetti with tomato sauce for dinner this evening. Per chart, pt has an allergy to tomatoes. When asking the patient why she ate this choice, she stated "I don't know".

## 2023-10-24 NOTE — Progress Notes (Signed)
 About 3 am, pt hit call light and asked for sheets and new scrubs. Pt had taken off her brief in bed and wet the bed. Pt needed help to be bathed, changed, and dressed. Pt was unable to do any of it herself, needed a maximum assist.

## 2023-10-24 NOTE — Progress Notes (Signed)
   10/24/23 2156  Psych Admission Type (Psych Patients Only)  Admission Status Voluntary  Psychosocial Assessment  Patient Complaints Depression  Eye Contact Brief  Facial Expression Sad  Affect Appropriate to circumstance  Speech Aphasic;Slurred  Interaction Needy  Motor Activity Shuffling;Slow  Appearance/Hygiene In scrubs  Behavior Characteristics Irritable  Mood Depressed;Irritable  Thought Process  Coherency Circumstantial  Content Preoccupation  Delusions None reported or observed  Perception WDL  Hallucination None reported or observed  Judgment Poor  Confusion None  Danger to Self  Current suicidal ideation? Denies  Self-Injurious Behavior No self-injurious ideation or behavior indicators observed or expressed   Agreement Not to Harm Self Yes  Description of Agreement verbal  Danger to Others  Danger to Others None reported or observed

## 2023-10-24 NOTE — Progress Notes (Signed)
   10/23/23 2200  Psychosocial Assessment  Patient Complaints Depression;Hopelessness  Eye Contact Fair  Facial Expression Sad  Affect Anxious  Speech Aphasic  Interaction Needy  Motor Activity Slow;Shuffling  Appearance/Hygiene Disheveled  Behavior Characteristics Anxious  Mood Depressed  Thought Process  Coherency Circumstantial  Content Preoccupation  Delusions None reported or observed  Perception WDL  Hallucination None reported or observed  Judgment Poor  Confusion Mild  Danger to Self  Current suicidal ideation? Passive  Agreement Not to Harm Self Yes  Description of Agreement Verbal Contract  Danger to Others  Danger to Others None reported or observed

## 2023-10-24 NOTE — Progress Notes (Signed)
 Pt has had no further episodes of vomiting for the last several hours. Patient was given another dose of clonidine  for high BP (see flowsheet) after her vomiting had subsided for an hour. Will let pt rest and recheck vitals in the morning. Will continue to monitor.

## 2023-10-24 NOTE — Progress Notes (Signed)
 Pt requested new scrubs after coming up to take her evening medications.  Pt reported some continued stomach cramping/nausea.  Pt called RN into room while putting on new scrubs and stated that she felt she had some of the same symptoms she had had with her last stroke.  When asked what those symptoms were Pt stated that she was having difficulty lifting her left leg to get the scrub pants on.  Neurocheck performed while Pt getting VS taken and Pt had weak grip on left side and weak when pushing left foot.  No tongue deviation or facial droop noted.  Pt speech no different and fully understandable.  NP notified and VS noted to be same as since her admission.  Chewable 81 mg aspirin  ordered to be given and Pt to be closely monitored.  NP noted that Pt had been detoxing as well as experiencing nausea/vomiting/diarrhea last night and this morning. Explained to Pt that it is normal to be weaker and to feel weaker after such an incident.  Gatorade encouraged and straw provided for Pt which she was able to use.  Will continue to closely monitor.     10/24/23 2231  Vital Signs  Pulse Rate 65  Pulse Rate Source Monitor  BP (!) 150/87  BP Location Right Arm  BP Method Automatic  Patient Position (if appropriate) Sitting  Oxygen Therapy  SpO2 99 %

## 2023-10-24 NOTE — Progress Notes (Signed)
 [facility] MD Progress Note  10/24/2023 3:03 PM Kaitlyn Gray  MRN:  [id number] Subjective:   48 year old African American female, single, unemployed, on SSI disability, lives alone. Background history of MDD recurrent, substance use disorder, substance-induced mood disorder and multiple medical comorbidities. Presented to the emergency room on account of pain. Intoxicated with cocaine at presentation. Reported auditory and visual hallucinations. Voices were telling her to kill herself. Voluntary admission to seek help.  Chart reviewed today.  Patient discussed at multidisciplinary team meeting.  Nursing staff reports that patient is able to mobilize herself with a walker.  She ate tomatoes which she is reported to be allergic to.  She was physically sick last night after eating this as she was throwing up.  No observed response to internal stimuli.  PT input much appreciated.  At interview with patient today, she tells me that she does not have allergy to tomato.  States that she ate much more than usual for her as she has been homeless for a while.  States that she became sick due to overeating.  Patient has not had any hallucination for over 24 hours.  She tells me that she is more clear minded.  She is not endorsing any delusions.  Patient remains focused on rehab.  States that she would like to speak to her daughters tomorrow being Mother's Day.  Patient has also agreed to aripiprazole  depot today.   Principal Problem: MDD (major depressive disorder), recurrent, severe, with psychosis (HCC) Diagnosis: Principal Problem:   MDD (major depressive disorder), recurrent, severe, with psychosis (HCC) Active Problems:   Psychoactive substance-induced organic mood disorder (HCC)  Total Time spent with patient: 20 minutes  Past Psychiatric History:  Extensive history of substance use disorder.  Drug of choice is opioids, cocaine and THC.  History of major depressive disorder recurrent and  substance-induced psychotic/mood disorder.  Patient has had over half a dozen hospitalizations in the past.  She used to be on Suboxone .  No past inpatient rehab.  She used to be on Suboxone . Patient has multiple self-injurious behavior in context of being intoxicated with psychoactive substances.  She has a history of being violent while under the influence.  She has fleeting psychotic and manic symptoms while under the influence. Patient did well with the Abilify  maintainer.  No recollection of other medicines that she has tried in the past.  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) 07/19/2017   Hypertension    Hypertensive emergency 05/31/2022   Ischemic stroke (HCC) 01/01/2020   subacute right middle cerebellar peduncle and pons infarction   Left-sided weakness 01/27/2022   Head CT with remote right occipital infarct which is consistent with old left-sided weakness   MDD (major depressive disorder), recurrent severe, 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   Normocytic anemia 02/09/2020   Opiate use    History of Suboxone  Therapy until 05/2021   Polysubstance abuse (HCC) 07/19/2017   Prescription drug misadventures (Seroquel )  Past Surgical History:  Procedure Laterality Date   CESAREAN SECTION     Family History:  Family History  Problem Relation Age of Onset   Hypertension Mother    CAD Mother 32       died of MI at age 85   Hypertension  Father    Family Psychiatric  History:  Family history of addiction. No family history of suicide.   Social History:  Social History   Substance and Sexual Activity  Alcohol Use Not Currently     Social History   Substance and Sexual Activity  Drug Use Yes   Types: Marijuana, Cocaine, "Crack" cocaine   Comment: occassionally    Social History   Socioeconomic History   Marital status: Single    Spouse name: Not on file   Number of children: Not on file   Years of education: Not on file   Highest education level: Not on file  Occupational History   Not on file  Tobacco Use   Smoking status: Every Day    Types: Cigarettes    Start date: 06/16/1998    Passive exposure: Current   Smokeless tobacco: Former  Building services engineer status: Every Day  Substance and Sexual Activity   Alcohol use: Not Currently   Drug use: Yes    Types: Marijuana, Cocaine, "Crack" cocaine    Comment: occassionally   Sexual activity: Never    Birth control/protection: None  Other Topics Concern   Not on file  Social History Narrative   ** Merged History Encounter **       Social Drivers of Health   Financial Resource Strain: Not on file  Food Insecurity: Food Insecurity Present (10/21/2023)   Hunger Vital Sign    Worried About Running Out of Food in the Last Year: Sometimes true    Ran Out of Food in the Last Year: Sometimes true  Transportation Needs: No Transportation Needs (10/21/2023)   PRAPARE - Administrator, Civil Service (Medical): No    Lack of Transportation (Non-Medical): No  Recent Concern: Transportation Needs - Unmet Transportation Needs (09/17/2023)   PRAPARE - Administrator, Civil Service (Medical): Yes    Lack of Transportation (Non-Medical): Yes  Physical Activity: Not on file  Stress: Not on file  Social Connections: Not on file   Additional Social History:  Patient was raised by her grandmother as her mother was not emotionally available.  Her  mother suffered from substance use disorder.  Her grandmother was murdered in 26.  Patient reports delayed developmental milestones.  She had special education.  She was kicked out in 12 th grade for fighting.  She has been incarcerated a couple of times for fighting.  She reports illicit means of sustaining her habit.  Patient is on SSI disability.  She lives alone.  No contact with her two children who are in foster care.  Limited support in the community   Current Medications: Current Facility-Administered Medications  Medication Dose Route Frequency Provider Last Rate Last Admin   acetaminophen  (TYLENOL ) tablet 650 mg  650 mg Oral Q6H PRN Volanda Gruber, NP   650 mg at 10/21/23 2209   alum & mag hydroxide-simeth (MAALOX/MYLANTA) 200-200-20 MG/5ML suspension 30 mL  30 mL Oral Q4H PRN Volanda Gruber, NP   30 mL at 10/23/23 2003   ARIPiprazole  (ABILIFY ) tablet 20 mg  20 mg Oral Daily Sibyl Mikula, Iline Mallory, MD   20 mg at 10/24/23  0932   cloNIDine  (CATAPRES ) tablet 0.1 mg  0.1 mg Oral Once Bobbitt, Shalon E, NP       haloperidol  (HALDOL ) tablet 5 mg  5 mg Oral TID PRN Volanda Gruber, NP   5 mg at 10/23/23 2003   And   diphenhydrAMINE  (BENADRYL ) capsule 50 mg  50 mg Oral TID PRN Volanda Gruber, NP   50 mg at 10/23/23 2003   haloperidol  lactate (HALDOL ) injection 5 mg  5 mg Intramuscular TID PRN Volanda Gruber, NP       And   diphenhydrAMINE  (BENADRYL ) injection 50 mg  50 mg Intramuscular TID PRN Volanda Gruber, NP       And   LORazepam  (ATIVAN ) injection 2 mg  2 mg Intramuscular TID PRN Volanda Gruber, NP       haloperidol  lactate (HALDOL ) injection 10 mg  10 mg Intramuscular TID PRN Volanda Gruber, NP       And   diphenhydrAMINE  (BENADRYL ) injection 50 mg  50 mg Intramuscular TID PRN Volanda Gruber, NP       And   LORazepam  (ATIVAN ) injection 2 mg  2 mg Intramuscular TID PRN Volanda Gruber, NP       ferrous sulfate  tablet 325 mg  325 mg Oral QODAY Stevens, Terry J, NP   325  mg at 10/23/23 1610   gabapentin  (NEURONTIN ) capsule 100 mg  100 mg Oral TID Volanda Gruber, NP   100 mg at 10/24/23 9604   losartan  (COZAAR ) tablet 100 mg  100 mg Oral Daily Volanda Gruber, NP   100 mg at 10/24/23 0932   magnesium  hydroxide (MILK OF MAGNESIA) suspension 30 mL  30 mL Oral Daily PRN Volanda Gruber, NP       metFORMIN  (GLUCOPHAGE ) tablet 500 mg  500 mg Oral BID WC Volanda Gruber, NP   500 mg at 10/24/23 5409   multivitamin with minerals tablet 1 tablet  1 tablet Oral Daily Volanda Gruber, NP   1 tablet at 10/24/23 8119   nicotine  (NICODERM CQ  - dosed in mg/24 hours) patch 21 mg  21 mg Transdermal Q0600 Volanda Gruber, NP       ondansetron  (ZOFRAN -ODT) disintegrating tablet 4 mg  4 mg Oral Q8H PRN Bobbitt, Shalon E, NP   4 mg at 10/23/23 2132   QUEtiapine  (SEROQUEL ) tablet 25 mg  25 mg Oral QHS Dawnya Grams A, MD   25 mg at 10/22/23 2203   rosuvastatin  (CRESTOR ) tablet 40 mg  40 mg Oral Daily Volanda Gruber, NP   40 mg at 10/24/23 1478    Lab Results:  Results for orders placed or performed during the hospital encounter of 10/21/23 (from the past 48 hours)  Hemoglobin A1c     Status: None   Collection Time: 10/23/23  6:29 AM  Result Value Ref Range   Hgb A1c MFr Bld 5.4 4.8 - 5.6 %    Comment: (NOTE) Pre diabetes:          5.7%-6.4%  Diabetes:              >6.4%  Glycemic control for   <7.0% adults with diabetes    Mean Plasma Glucose 108.28 mg/dL    Comment: Performed at [facility], [address] [place], [place] [address]  Lipid panel     Status: None   Collection Time: 10/23/23  6:29 AM  Result Value Ref Range   Cholesterol  123 0 - 200 mg/dL   Triglycerides 56 <782 mg/dL   HDL 48 >95 mg/dL   Total CHOL/HDL Ratio 2.6 RATIO   VLDL 11 0 - 40 mg/dL   LDL Cholesterol 64 0 - 99 mg/dL    Comment:        Total Cholesterol/HDL:CHD Risk Coronary Heart Disease Risk Table                     Men   Women  1/2 Average Risk   3.4   3.3  Average  Risk       5.0   4.4  2 X Average Risk   9.6   7.1  3 X Average Risk  23.4   11.0        Use the calculated Patient Ratio above and the CHD Risk Table to determine the patient's CHD Risk.        ATP III CLASSIFICATION (LDL):  <100     mg/dL   Optimal  621-308  mg/dL   Near or Above                    Optimal  130-159  mg/dL   Borderline  657-846  mg/dL   High  >962     mg/dL   Very High Performed at [facility], [address] [place], [place] [address]     Blood Alcohol level:  Lab Results  Component Value Date   [facility] <15 10/20/2023   ETH <10 07/04/2023    Metabolic Disorder Labs: Lab Results  Component Value Date   HGBA1C 5.4 10/23/2023   MPG 108.28 10/23/2023   MPG 134.11 12/23/2022   Lab Results  Component Value Date   PROLACTIN 20.7 12/23/2022   Lab Results  Component Value Date   CHOL 123 10/23/2023   TRIG 56 10/23/2023   HDL 48 10/23/2023   CHOLHDL 2.6 10/23/2023   VLDL 11 10/23/2023   LDLCALC 64 10/23/2023   LDLCALC 95 12/23/2022    Physical Findings: AIMS:  , ,  ,  ,    CIWA:    COWS:     Musculoskeletal: Strength & Muscle Tone: Unable to assess today Gait & Station: Unable to assess today Patient leans: N/A  Psychiatric Specialty Exam:  Presentation  General Appearance and behavior:  Better grooming, in bed, not in any distress, good rapport.  No EPS  Eye Contact: Good.  Speech: Spontaneous.  Soft spoken.  Mood and Affect  Mood: Subjectively and objectively better.   Affect: Restricted and appropriate.   Thought Process  Thought Processes: Linear and goal directed.   Descriptions of Associations:Intact   Orientation:Full (Time, Place and Person)   Thought Content: Focused on getting into rehab.  Future-oriented.  No current suicidal thoughts.  No homicidal thoughts.  No thoughts of violence.  No negative ruminative flooding.  No guilty ruminations.  No delusional theme.  No obsessions.    Hallucinations: Fleeting auditory and visual hallucination.   Sensorium  Memory: Good.  Judgment: Better   Insight: Good as she is willing to take medication and go to rehab.   Executive Functions  Concentration: Better.  Attention Span: Better   Recall: Good.   Fund of Knowledge: Limited.   Language: Fair.   Psychomotor Activity  Normalizing psychomotor activity   Physical Exam: Physical Exam ROS Blood pressure (!) 140/87, pulse 62, temperature 98.5 F (36.9 C), temperature source Oral, resp. rate 16, height 5\' 4"  (  1.626 m), weight 68 kg, SpO2 100%. Body mass index is 25.75 kg/m.   Treatment Plan Summary: And is gradually responding to treatment.  She has tolerated recent adjustments well.  She has consented to long-acting injectable.  We will initiate Abilify  maintainer.  Hopeful discharge to rehab next week.   1.  Abilify  maintainer 300 mg monthly. 2.  Continue on Abilify  20 mg daily for ten days. 3.  Quetiapine  25 mg at bedtime 4.  Gabapentin  100 mg 3 times daily 5.  Continue home medical medications at the same dose. 6.  We will encourage unit groups and therapeutic activities as she gets better. 7.  Social worker will coordinate discharge and aftercare planning.   Amelie Jury, MD 10/24/2023, 3:03 PM

## 2023-10-24 NOTE — Progress Notes (Signed)
 Pt resting in bed with eyes closed, even and unlabored breathing.  No distress noted.  Pt was assisted to finish dressing and cover herself up when given ordered ASA 81 mg chewable previously.  Will continue to monitor closely.

## 2023-10-25 DIAGNOSIS — F333 Major depressive disorder, recurrent, severe with psychotic symptoms: Secondary | ICD-10-CM | POA: Diagnosis not present

## 2023-10-25 MED ORDER — LOPERAMIDE HCL 2 MG PO CAPS
4.0000 mg | ORAL_CAPSULE | Freq: Once | ORAL | Status: AC
  Administered 2023-10-26: 4 mg via ORAL
  Filled 2023-10-25 (×2): qty 2

## 2023-10-25 NOTE — Progress Notes (Signed)
   10/25/23 2232  Psych Admission Type (Psych Patients Only)  Admission Status Voluntary  Psychosocial Assessment  Patient Complaints Depression  Eye Contact Brief  Facial Expression Flat  Affect Irritable;Preoccupied  Speech Aphasic;Slurred  Interaction Needy  Motor Activity Shuffling;Slow (utilizes walker)  Appearance/Hygiene In scrubs  Behavior Characteristics Elopement risk;Irritable  Mood Depressed  Thought Process  Coherency Circumstantial  Content Preoccupation  Delusions None reported or observed  Perception Hallucinations  Hallucination Auditory  Judgment Poor  Confusion None  Danger to Self  Current suicidal ideation? Denies  Self-Injurious Behavior No self-injurious ideation or behavior indicators observed or expressed   Agreement Not to Harm Self Yes  Description of Agreement verbal  Danger to Others  Danger to Others None reported or observed

## 2023-10-25 NOTE — [facility] Group Notes (Signed)
 [facility] Group Notes:  (Nursing/MHT/Case Management/Adjunct)  Date:  10/25/2023  Time:  9:21 PM  Type of Therapy:  Wrap-up group  Participation Level:  Did Not Attend  Participation Quality:    Affect:    Cognitive:    Insight:    Engagement in Group:    Modes of Intervention:    Summary of Progress/Problems:  Kaitlyn Gray 10/25/2023, 9:21 PM

## 2023-10-25 NOTE — Progress Notes (Signed)
   10/25/23 0513  15 Minute Checks  Location Bedroom  Visual Appearance Calm  Behavior Sleeping  Sleep (Behavioral Health Patients Only)  Calculate sleep? (Click Yes once per 24 hr at 0600 safety check) Yes  Documented sleep last 24 hours 10.5

## 2023-10-25 NOTE — Progress Notes (Signed)
 Patient agitated, trying to get out of the front door. Patient complaining about medication not working and hearing voices. Verbal de-escalation attempted, but unsuccessful. IM agitation protocol administered, per MAR. Patient remains safe at this time.

## 2023-10-25 NOTE — Progress Notes (Signed)
 Pt had episode of incontinence - bed changed and shower chair given to Pt in shower.  New gowns, briefs, towels and washcloths supplied.  Pt back in bed in no acute distress.

## 2023-10-25 NOTE — Plan of Care (Signed)
  Problem: Coping: Goal: Ability to verbalize frustrations and anger appropriately will improve Outcome: Progressing   Problem: Activity: Goal: Sleeping patterns will improve Outcome: Progressing

## 2023-10-25 NOTE — Plan of Care (Signed)
  Problem: Coping: Goal: Ability to verbalize frustrations and anger appropriately will improve Outcome: Not Progressing Goal: Ability to demonstrate self-control will improve Outcome: Not Progressing   

## 2023-10-25 NOTE — Progress Notes (Addendum)
 Pt was found in room by staff banging her head against the wall, endorsing continued auditory hallucinations. Pt was given haldol  and benadryl  PO for agitation and was encouraged to rest. Pt verbally contracted for safety. Will continue to assess.

## 2023-10-25 NOTE — Group Note (Signed)
 Date:  10/25/2023 Time:  4:25 PM  Group Topic/Focus:  Dimensions of Wellness:   The focus of this group is to introduce the topic of wellness and discuss the role each dimension of wellness plays in total health.    Participation Level:  Did Not Attend   [name] 10/25/2023, 4:25 PM

## 2023-10-25 NOTE — Progress Notes (Signed)
 [facility] MD Progress Note  10/25/2023 11:45 AM Kaitlyn Gray  MRN:  [id number] Subjective:   48 year old African American female, single, unemployed, on SSI disability, lives alone. Background history of MDD recurrent, substance use disorder, substance-induced mood disorder and multiple medical comorbidities. Presented to the emergency room on account of pain. Intoxicated with cocaine at presentation. Reported auditory and visual hallucinations. Voices were telling her to kill herself. Voluntary admission to seek help.  Chart reviewed today.  Patient discussed at multidisciplinary team meeting.  Nursing staff reports that patient slept for 11 hours yesterday.  Not observed to be responding to internal stimuli.  Not endorsing any suicidal thoughts.  No rageful thoughts.  She has been adherent with her medications.  Phone contacts for her daughters were provided to her.  Seen today.  Patient tells me that she was able to speak to one of her daughters but the other daughter will not pick up her call.  Patient was tearful while talking about it.  States that she is not sure why her daughters have such resentment was.  States that her eldest daughter was 65 years of age when she gave them up for adoption.  Patient has not experienced any form of hallucination for over 48 hours.  She is not endorsing any delusion.  She is still focused on rehab though gets ambivalent at times.  She is not endorsing any futility thoughts.  No rageful thoughts towards self or to others.  No adverse effects from her medications. I have encouraged her to keep ventilating her feelings to staff.    Principal Problem: MDD (major depressive disorder), recurrent, severe, with psychosis (HCC) Diagnosis: Principal Problem:   MDD (major depressive disorder), recurrent, severe, with psychosis (HCC) Active Problems:   Psychoactive substance-induced organic mood disorder (HCC)  Total Time spent with patient: 20 minutes  Past  Psychiatric History:  Extensive history of substance use disorder.  Drug of choice is opioids, cocaine and THC.  History of major depressive disorder recurrent and substance-induced psychotic/mood disorder.  Patient has had over half a dozen hospitalizations in the past.  She used to be on Suboxone .  No past inpatient rehab.  She used to be on Suboxone . Patient has multiple self-injurious behavior in context of being intoxicated with psychoactive substances.  She has a history of being violent while under the influence.  She has fleeting psychotic and manic symptoms while under the influence. Patient did well with the Abilify  maintainer.  No recollection of other medicines that she has tried in the past.  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) 07/19/2017   Hypertension    Hypertensive emergency 05/31/2022   Ischemic stroke (HCC) 01/01/2020   subacute right middle cerebellar peduncle and pons infarction   Left-sided weakness 01/27/2022   Head CT with remote right occipital infarct which is consistent with old left-sided weakness   MDD (major depressive disorder), recurrent severe, 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   Normocytic anemia  02/09/2020   Opiate use    History of Suboxone  Therapy until 05/2021   Polysubstance abuse (HCC) 07/19/2017   Prescription drug misadventures (Seroquel )     Past Surgical History:  Procedure Laterality Date   CESAREAN  SECTION     Family History:  Family History  Problem Relation Age of Onset   Hypertension Mother    CAD Mother 77       died of MI at age 18   Hypertension Father    Family Psychiatric  History:  Family history of addiction. No family history of suicide.   Social History:  Social History   Substance and Sexual Activity  Alcohol Use Not Currently     Social History   Substance and Sexual Activity  Drug Use Yes   Types: Marijuana, Cocaine, "Crack" cocaine   Comment: occassionally    Social History   Socioeconomic History   Marital status: Single    Spouse name: Not on file   Number of children: Not on file   Years of education: Not on file   Highest education level: Not on file  Occupational History   Not on file  Tobacco Use   Smoking status: Every Day    Types: Cigarettes    Start date: 06/16/1998    Passive exposure: Current   Smokeless tobacco: Former  Building services engineer status: Every Day  Substance and Sexual Activity   Alcohol use: Not Currently   Drug use: Yes    Types: Marijuana, Cocaine, "Crack" cocaine    Comment: occassionally   Sexual activity: Never    Birth control/protection: None  Other Topics Concern   Not on file  Social History Narrative   ** Merged History Encounter **       Social Drivers of Health   Financial Resource Strain: Not on file  Food Insecurity: Food Insecurity Present (10/21/2023)   Hunger Vital Sign    Worried About Running Out of Food in the Last Year: Sometimes true    Ran Out of Food in the Last Year: Sometimes true  Transportation Needs: No Transportation Needs (10/21/2023)   PRAPARE - Administrator, Civil Service (Medical): No    Lack of Transportation (Non-Medical): No  Recent Concern: Transportation Needs - Unmet Transportation Needs (09/17/2023)   PRAPARE - Administrator, Civil Service (Medical): Yes    Lack of Transportation (Non-Medical): Yes  Physical Activity: Not on file  Stress: Not  on file  Social Connections: Not on file   Additional Social History:  Patient was raised by her grandmother as her mother was not emotionally available.  Her mother suffered from substance use disorder.  Her grandmother was murdered in 60.  Patient reports delayed developmental milestones.  She had special education.  She was kicked out in 12 th grade for fighting.  She has been incarcerated a couple of times for fighting.  She reports illicit means of sustaining her habit.  Patient is on SSI disability.  She lives alone.  No contact with her two children who are in foster care.  Limited support in the community   Current Medications: Current Facility-Administered Medications  Medication Dose Route Frequency Provider Last Rate Last Admin   acetaminophen  (TYLENOL ) tablet 650 mg  650 mg Oral Q6H PRN Volanda Gruber, NP   650 mg at 10/21/23 2209   alum & mag hydroxide-simeth (MAALOX/MYLANTA) 200-200-20 MG/5ML suspension 30 mL  30 mL Oral Q4H PRN Volanda Gruber,  NP   30 mL at 10/23/23 2003   ARIPiprazole  (ABILIFY ) tablet 20 mg  20 mg Oral Daily Lark Runk, Iline Mallory, MD   20 mg at 10/25/23 8469   ARIPiprazole  ER (ABILIFY  MAINTENA) injection 300 mg  300 mg Intramuscular Q28 days Lavine Hargrove, Iline Mallory, MD   300 mg at 10/24/23 1716   cloNIDine  (CATAPRES ) tablet 0.1 mg  0.1 mg Oral Once Bobbitt, Shalon E, NP       haloperidol  (HALDOL ) tablet 5 mg  5 mg Oral TID PRN Volanda Gruber, NP   5 mg at 10/23/23 2003   And   diphenhydrAMINE  (BENADRYL ) capsule 50 mg  50 mg Oral TID PRN Volanda Gruber, NP   50 mg at 10/23/23 2003   haloperidol  lactate (HALDOL ) injection 5 mg  5 mg Intramuscular TID PRN Volanda Gruber, NP       And   diphenhydrAMINE  (BENADRYL ) injection 50 mg  50 mg Intramuscular TID PRN Volanda Gruber, NP       And   LORazepam  (ATIVAN ) injection 2 mg  2 mg Intramuscular TID PRN Volanda Gruber, NP       haloperidol  lactate (HALDOL ) injection 10 mg  10 mg Intramuscular TID PRN Volanda Gruber, NP       And   diphenhydrAMINE  (BENADRYL ) injection 50 mg  50 mg Intramuscular TID PRN Volanda Gruber, NP       And   LORazepam  (ATIVAN ) injection 2 mg  2 mg Intramuscular TID PRN Volanda Gruber, NP       ferrous sulfate  tablet 325 mg  325 mg Oral QODAY Stevens, Terry J, NP   325 mg at 10/25/23 6295   gabapentin  (NEURONTIN ) capsule 100 mg  100 mg Oral TID Volanda Gruber, NP   100 mg at 10/25/23 2841   loperamide  (IMODIUM ) capsule 4 mg  4 mg Oral Once Bobbitt, Shalon E, NP       losartan  (COZAAR ) tablet 100 mg  100 mg Oral Daily Volanda Gruber, NP   100 mg at 10/25/23 3244   magnesium  hydroxide (MILK OF MAGNESIA) suspension 30 mL  30 mL Oral Daily PRN Volanda Gruber, NP       metFORMIN  (GLUCOPHAGE ) tablet 500 mg  500 mg Oral BID WC Volanda Gruber, NP   500 mg at 10/25/23 0102   multivitamin with minerals tablet 1 tablet  1 tablet Oral Daily Volanda Gruber, NP   1 tablet at 10/25/23 7253   nicotine  (NICODERM CQ  - dosed in mg/24 hours) patch 21 mg  21 mg Transdermal Q0600 Volanda Gruber, NP       ondansetron  (ZOFRAN -ODT) disintegrating tablet 4 mg  4 mg Oral Q8H PRN Bobbitt, Shalon E, NP   4 mg at 10/24/23 2212   QUEtiapine  (SEROQUEL ) tablet 25 mg  25 mg Oral QHS Glema Takaki, Iline Mallory, MD   25 mg at 10/24/23 2100   rosuvastatin  (CRESTOR ) tablet 40 mg  40 mg Oral Daily Volanda Gruber, NP   40 mg at 10/25/23 6644    Lab Results:  No results found for this or any previous visit (from the past 48 hours).   Blood Alcohol level:  Lab Results  Component Value Date   [facility] <15 10/20/2023   ETH <10 07/04/2023    Metabolic Disorder Labs: Lab Results  Component Value Date   HGBA1C 5.4 10/23/2023   MPG 108.28 10/23/2023   MPG 134.11 12/23/2022   Lab Results  Component Value Date   PROLACTIN 20.7 12/23/2022   Lab Results  Component Value Date   CHOL 123 10/23/2023   TRIG 56 10/23/2023   HDL 48 10/23/2023   CHOLHDL 2.6 10/23/2023   VLDL 11 10/23/2023   LDLCALC 64  10/23/2023   LDLCALC 95 12/23/2022    Physical Findings: AIMS:  , ,  ,  ,    CIWA:    COWS:     Musculoskeletal: Strength & Muscle Tone: Unable to assess today Gait & Station: Unable to assess today Patient leans: N/A  Psychiatric Specialty Exam:  Presentation  General Appearance and behavior:  Current family dressed, mobilizing today with a walker, teary while talking about her daughters, good rapport.  No EPS  Eye Contact: Good.  Speech: Spontaneous.  Normalizing rate, tone and volume.  Mood and Affect  Mood: Subjectively and objectively better.   Affect: Restricted and appropriate.   Thought Process  Thought Processes: Linear and goal directed.   Descriptions of Associations:Intact   Orientation:Full (Time, Place and Person)   Thought Content: Focused on getting into rehab.  Future-oriented.  No current suicidal thoughts.  No homicidal thoughts.  No thoughts of violence.  No negative ruminative flooding.  No guilty ruminations.  No delusional theme.  No obsessions.   Hallucinations: No hallucination in any modality.  Sensorium  Memory: Good.  Judgment: Good.   Insight: Good as she is willing to take medication and go to rehab.   Executive Functions  Concentration: Good.  Attention Span: Good.  Recall: Good.   Fund of Knowledge: Fair.   Language: Fair.   Psychomotor Activity  Normalizing psychomotor activity   Physical Exam: Physical Exam ROS Blood pressure (!) 157/84, pulse 67, temperature 98.5 F (36.9 C), temperature source Oral, resp. rate 16, height 5\' 4"  (1.626 m), weight 68 kg, SpO2 100%. Body mass index is 25.75 kg/m.   Treatment Plan Summary: Patient is responding well to treatment.  She had a longer acting injectable yesterday.  We will continue oral Abilify  for nine days.   We will keep other medicines the same. Her goal is to get into rehab.  We can hopefully start looking into this this week.    1.  Abilify   maintainer 300 mg monthly. 2.  Continue on Abilify  20 mg daily for nine days. 3.  Quetiapine  25 mg at bedtime 4.  Gabapentin  100 mg 3 times daily 5.  Continue home medical medications at the same dose. 6.  We will encourage unit groups and therapeutic activities as she gets better. 7.  Social worker will coordinate discharge and aftercare planning.   Amelie Jury, MD 10/25/2023, 11:45 AM

## 2023-10-26 ENCOUNTER — Other Ambulatory Visit (HOSPITAL_COMMUNITY): Payer: Self-pay

## 2023-10-26 ENCOUNTER — Other Ambulatory Visit: Payer: Self-pay

## 2023-10-26 DIAGNOSIS — F333 Major depressive disorder, recurrent, severe with psychotic symptoms: Secondary | ICD-10-CM | POA: Diagnosis not present

## 2023-10-26 MED ORDER — METFORMIN HCL 500 MG PO TABS
500.0000 mg | ORAL_TABLET | Freq: Two times a day (BID) | ORAL | 0 refills | Status: DC
  Filled 2023-10-26: qty 60, 30d supply, fill #0

## 2023-10-26 MED ORDER — LOPERAMIDE HCL 2 MG PO CAPS
2.0000 mg | ORAL_CAPSULE | ORAL | Status: DC | PRN
Start: 2023-10-26 — End: 2023-10-26
  Administered 2023-10-26: 2 mg via ORAL
  Filled 2023-10-26: qty 1

## 2023-10-26 MED ORDER — ARIPIPRAZOLE 20 MG PO TABS
20.0000 mg | ORAL_TABLET | Freq: Every day | ORAL | 0 refills | Status: DC
  Filled 2023-10-26: qty 30, 30d supply, fill #0

## 2023-10-26 MED ORDER — ARIPIPRAZOLE 20 MG PO TABS
20.0000 mg | ORAL_TABLET | Freq: Every day | ORAL | 0 refills | Status: DC
  Filled 2023-10-26: qty 8, 8d supply, fill #0

## 2023-10-26 MED ORDER — ARIPIPRAZOLE ER 400 MG IM SRER
300.0000 mg | INTRAMUSCULAR | 0 refills | Status: DC
Start: 2023-11-21 — End: 2024-03-03
  Filled 2023-10-26: qty 2, 28d supply, fill #0
  Filled 2023-10-27: qty 1, 28d supply, fill #0
  Filled 2023-10-29: qty 2, 56d supply, fill #0
  Filled 2023-11-05: qty 2, 30d supply, fill #0

## 2023-10-26 MED ORDER — QUETIAPINE FUMARATE 25 MG PO TABS
25.0000 mg | ORAL_TABLET | Freq: Every day | ORAL | 0 refills | Status: DC
Start: 2023-10-26 — End: 2023-12-09
  Filled 2023-10-26: qty 30, 30d supply, fill #0

## 2023-10-26 NOTE — BHH Group Notes (Signed)
 [facility] Group Notes:  (Nursing/MHT/Case Management/Adjunct)  Date:  10/26/2023  Time:  12:03 PM  Type of Therapy:  Group Therapy  Participation Level:  Did Not Attend  Kaitlyn Gray 10/26/2023, 12:03 PM

## 2023-10-26 NOTE — Transportation (Signed)
 10/26/2023  Sumner Ends DOB: 11-28-1975 MRN: [id number]   RIDER WAIVER AND RELEASE OF LIABILITY  For the purposes of helping with transportation needs, [facility] partners with outside transportation providers (taxi companies, [employer], Catering manager.) to give [facility] [place] patients or other approved people the choice of on-demand rides [employer]") to our buildings for non-emergency visits.  By using [employer], I, the person signing this document, on behalf of myself and/or any legal minors (in my care using the [employer]), agree:  Science writer given to me are supplied by independent, outside transportation providers who do not work for, or have any affiliation with, [employer]. [place] is not a transportation company. [facility] has no control over the quality or safety of the rides I get using [employer]. [place] has no control over whether any outside ride will happen on time or not. [facility] gives no guarantee on the reliability, quality, safety, or availability on any rides, or that no mistakes will happen. I know and accept that traveling by vehicle (car, truck, [employer], [employer], bus, taxi, etc.) has risks of serious injuries such as disability, being paralyzed, and death. I know and agree the risk of using [employer] is mine alone, and not [employer]. Transport Services are provided "as is" and as are available. The transportation providers are in charge for all inspections and care of the vehicles used to provide these rides. I agree not to take legal action against [facility], its agents, employees, officers, directors, representatives, insurers, attorneys, assigns, successors, subsidiaries, and affiliates at any time for any reasons related directly or indirectly to using [employer]. I also agree not to take legal action against [place] or its affiliates for any injury, death, or damage to property caused by or related to using  [employer]. I have read this Waiver and Release of Liability, and I understand the terms used in it and their legal meaning. This Waiver is freely and voluntarily given with the understanding that my right (or any legal minors) to legal action against [place] relating to [employer] is knowingly given up to use these services.   I attest that I read the Ride Waiver and Release of Liability to Sumner Ends, gave Ms. Yueh the opportunity to ask questions and answered the questions asked (if any). I affirm that Sumner Ends then provided consent for assistance with transportation.

## 2023-10-26 NOTE — Plan of Care (Signed)
   Problem: Activity: Goal: Interest or engagement in activities will improve Outcome: Progressing   Problem: Coping: Goal: Ability to verbalize frustrations and anger appropriately will improve Outcome: Progressing   Problem: Safety: Goal: Periods of time without injury will increase Outcome: Progressing

## 2023-10-26 NOTE — Plan of Care (Signed)
  Problem: Education: Goal: Knowledge of [facility] Education information/materials will improve 10/26/2023 1632 by Jeoffrey Mole, RN Outcome: Completed/Met 10/26/2023 1333 by Jeoffrey Mole, RN Outcome: Progressing Goal: Emotional status will improve 10/26/2023 1632 by Jeoffrey Mole, RN Outcome: Completed/Met 10/26/2023 1333 by Jeoffrey Mole, RN Outcome: Progressing Goal: Mental status will improve 10/26/2023 1632 by Jeoffrey Mole, RN Outcome: Completed/Met 10/26/2023 1333 by Jeoffrey Mole, RN Outcome: Progressing Goal: Verbalization of understanding the information provided will improve 10/26/2023 1632 by Jeoffrey Mole, RN Outcome: Completed/Met 10/26/2023 1333 by Jeoffrey Mole, RN Outcome: Progressing   Problem: Activity: Goal: Interest or engagement in activities will improve 10/26/2023 1632 by Jeoffrey Mole, RN Outcome: Completed/Met 10/26/2023 1333 by Jeoffrey Mole, RN Outcome: Progressing Goal: Sleeping patterns will improve 10/26/2023 1632 by Jeoffrey Mole, RN Outcome: Completed/Met 10/26/2023 1333 by Jeoffrey Mole, RN Outcome: Progressing   Problem: Coping: Goal: Ability to verbalize frustrations and anger appropriately will improve 10/26/2023 1632 by Jeoffrey Mole, RN Outcome: Completed/Met 10/26/2023 1333 by Jeoffrey Mole, RN Outcome: Progressing Goal: Ability to demonstrate self-control will improve 10/26/2023 1632 by Jeoffrey Mole, RN Outcome: Completed/Met 10/26/2023 1333 by Jeoffrey Mole, RN Outcome: Progressing   Problem: Health Behavior/Discharge Planning: Goal: Identification of resources available to assist in meeting health care needs will improve 10/26/2023 1632 by Jeoffrey Mole, RN Outcome: Completed/Met 10/26/2023 1333 by Jeoffrey Mole, RN Outcome: Progressing Goal: Compliance with treatment plan for underlying cause of condition will improve 10/26/2023 1632 by Jeoffrey Mole,  RN Outcome: Completed/Met 10/26/2023 1333 by Jeoffrey Mole, RN Outcome: Progressing   Problem: Physical Regulation: Goal: Ability to maintain clinical measurements within normal limits will improve 10/26/2023 1632 by Jeoffrey Mole, RN Outcome: Completed/Met 10/26/2023 1333 by Jeoffrey Mole, RN Outcome: Progressing   Problem: Safety: Goal: Periods of time without injury will increase 10/26/2023 1632 by Jeoffrey Mole, RN Outcome: Completed/Met 10/26/2023 1333 by Jeoffrey Mole, RN Outcome: Progressing

## 2023-10-26 NOTE — Progress Notes (Signed)
   10/26/23 0530  15 Minute Checks  Location Bedroom  Visual Appearance Calm  Behavior Sleeping  Sleep (Behavioral Health Patients Only)  Calculate sleep? (Click Yes once per 24 hr at 0600 safety check) Yes  Documented sleep last 24 hours 12.25

## 2023-10-26 NOTE — Progress Notes (Signed)
 Sumner Ends D/C'd Home per MD order.  Discussed with the patient and all questions fully answered.   An After Visit Summary was printed and given to the patient. Patient received prescription.  D/c education completed with patient including follow up instructions, medication list, d/c activities limitations if indicated, with other d/c instructions as indicated by MD - patient able to verbalize understanding, all questions fully answered.   Patient instructed to return to ED, call [phone], or call MD for any changes in condition.   Patient escorted to the main entrance, and D/C home via taxi.  Jeoffrey Mole 10/26/2023 4:39 PM

## 2023-10-26 NOTE — BHH Suicide Risk Assessment (Signed)
 [facility] Discharge Suicide Risk Assessment   Principal Problem: MDD (major depressive disorder), recurrent, severe, with psychosis (HCC) Discharge Diagnoses: Principal Problem:   MDD (major depressive disorder), recurrent, severe, with psychosis (HCC) Active Problems:   Psychoactive substance-induced organic mood disorder (HCC)   Total Time spent with patient: 45 minutes  MSE: see discharge summary.  Physical Exam: Physical Exam ROS Blood pressure 124/74, pulse 65, temperature 98 F (36.7 C), temperature source Oral, resp. rate 17, height 5\' 4"  (1.626 m), weight 68 kg, SpO2 100%. Body mass index is 25.75 kg/m.  Mental Status Per Nursing Assessment::   On Admission:  Suicidal ideation indicated by patient   Suicide Risk:  Minimal: No identifiable suicidal ideation.  Patients presenting with no risk factors but with morbid ruminations; may be classified as minimal risk based on the severity of the depressive symptoms  [place] Suicide Severity Rating Scale  Wish to be dead: No  Suicidal thoughts: No                  Suicidal thoughts with method: No                  Suicidal intent: No                  Suicide intent with specific plan: No                  Suicide behavior: No   Follow-up Information     [facility] Go on 11/23/2023.   Why: You have an appointment for medication management services on 11/23/23 at 2:00 pm.   The appointment will be held in person. Contact information: [address] [place] [address] [phone]         [facility] Go on 11/02/2023.   Specialty: Professional Counselor Why: Please go to this provider on 11/02/23 at 9:00 am for therapy services.  You may also go on Monday through Friday, from 9 am to 1 pm. Contact information: [address]  [address] [place] [address] [phone]         [facility]. Call.   Specialty: Behavioral Health Why: A  referral has been made on your behalf for the substance abuse intensive outpatient therapy program.  Please call to schedule an assessment. Contact information: [address] [place]  [address] [phone]                Garner Jury, MD 10/26/2023, 2:51 PM

## 2023-10-26 NOTE — Progress Notes (Signed)
   10/26/23 1028  Vital Signs  Pulse Rate 65  Pulse Rate Source Dinamap  Resp 17  BP 124/74  BP Location Right Arm  BP Method Automatic  Patient Position (if appropriate) Sitting  Oxygen Therapy  SpO2 100 %  O2 Device Room Air   Patient complained of weakness, vital signs are within parameter. MD notified of patient complaint. Patient is safe on the unit, no acute distress noted staff will continue to provide support to patient.

## 2023-10-26 NOTE — Progress Notes (Signed)
 Pt had episode of incontinence of stool after coming out of bathroom.  Pt walked to bed to call for assistance and spread stool throughout room.  MHT pointed out call light in bathroom to Pt and assured Pt that she could just push that and help would arrive.  Pt appears confused at times at night.  Pt upset that food tray had been thrown away, this staff member explained to Pt that stool had gotten on tray.  Pt given snacks to eat and assured she will be invited down to breakfast.  Pt assisted to shower by MHT and room cleaned with bleach wipes.  New linens brought to Pt as well as new hospital gowns, briefs, and socks.  Pt given medication as ordered.  Will continue to monitor closely.

## 2023-10-26 NOTE — Discharge Summary (Signed)
 Physician Discharge Summary Note  Patient:  Kaitlyn Gray is an 48 y.o., female MRN:  [id number] DOB:  [birth year] Patient phone:  [phone] (home)  Patient address:   [address] [place] [address],  Total Time spent with patient: 45 minutes  Date of Admission:  10/21/2023 Date of Discharge: 10/26/2023  Reason for Admission:   48 year old African American female, single, unemployed, on SSI disability, lives alone. Background history of MDD recurrent, substance use disorder, substance-induced mood disorder and multiple medical comorbidities. Presented to the emergency room on account of pain. Intoxicated with cocaine at presentation. Reported auditory and visual hallucinations. Voices were telling her to kill herself. Voluntary admission to seek help.   Principal Problem: MDD (major depressive disorder), recurrent, severe, with psychosis (HCC) Discharge Diagnoses: Principal Problem:   MDD (major depressive disorder), recurrent, severe, with psychosis (HCC) Active Problems:   Psychoactive substance-induced organic mood disorder (HCC)   Past Psychiatric History:  Extensive history of substance use disorder.  Drug of choice is opioids, cocaine and THC.  History of major depressive disorder recurrent and substance-induced psychotic/mood disorder.  Patient has had over half a dozen hospitalizations in the past.  She used to be on Suboxone .  No past inpatient rehab.  She used to be on Suboxone . Patient has multiple self-injurious behavior in context of being intoxicated with psychoactive substances.  She has a history of being violent while under the influence.  She has fleeting psychotic and manic symptoms while under the influence. Patient did well with the Abilify  maintainer.  No recollection of other medicines that she has tried in the past.  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) 07/19/2017   Hypertension    Hypertensive emergency 05/31/2022   Ischemic stroke (HCC) 01/01/2020   subacute right middle cerebellar peduncle and pons infarction   Left-sided weakness 01/27/2022   Head CT with remote right occipital infarct which is consistent with old left-sided weakness   MDD (major depressive disorder), recurrent severe, 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   Normocytic anemia 02/09/2020   Opiate use    History of Suboxone  Therapy until 05/2021   Polysubstance abuse (HCC) 07/19/2017   Prescription drug misadventures (Seroquel )     Past Surgical History:  Procedure Laterality Date   CESAREAN SECTION     Family History:  Family History  Problem Relation Age of Onset   Hypertension Mother    CAD Mother 58       died of MI at age 59   Hypertension Father    Family Psychiatric  History:  Family history of addiction. No family history of suicide.   Social History:  Social History   Substance and Sexual Activity  Alcohol Use Not Currently     Social History   Substance and Sexual Activity  Drug Use Yes   Types: Marijuana, Cocaine, "Crack" cocaine   Comment: occassionally    Social History   Socioeconomic History   Marital status:  Single    Spouse name: Not on file   Number of children: Not on file   Years of education: Not on file   Highest education level: Not on file  Occupational History   Not on file  Tobacco Use   Smoking status: Every Day    Types: Cigarettes    Start date: 06/16/1998     Passive exposure: Current   Smokeless tobacco: Former  Building services engineer status: Every Day  Substance and Sexual Activity   Alcohol use: Not Currently   Drug use: Yes    Types: Marijuana, Cocaine, "Crack" cocaine    Comment: occassionally   Sexual activity: Never    Birth control/protection: None  Other Topics Concern   Not on file  Social History Narrative   ** Merged History Encounter **       Social Drivers of Health   Financial Resource Strain: Not on file  Food Insecurity: Food Insecurity Present (10/21/2023)   Hunger Vital Sign    Worried About Running Out of Food in the Last Year: Sometimes true    Ran Out of Food in the Last Year: Sometimes true  Transportation Needs: No Transportation Needs (10/21/2023)   PRAPARE - Administrator, Civil Service (Medical): No    Lack of Transportation (Non-Medical): No  Recent Concern: Transportation Needs - Unmet Transportation Needs (09/17/2023)   PRAPARE - Administrator, Civil Service (Medical): Yes    Lack of Transportation (Non-Medical): Yes  Physical Activity: Not on file  Stress: Not on file  Social Connections: Not on file    Hospital Course:    The patient was admitted to the inpatient psychiatric unit due to auditory and visual hallucinations in settings of substance use. She was restricted to ward and placed on suicide precautions. Patient was introduced to milieu activities and encouraged to participate in psycho-social groups. The plan at the time of admission was safety, stabilization and treatment.  Patient was treated with a combination of medication, therapy, and some educational activities during her hospital stay. She participated in individual and group therapy sessions, and attended various educational groups and participated in activities to help her manage her emotional and mental health. For the management of psychosis, patient was initiated on Abilify  and LAI Abilify  Maintenna 300mg  was  administered on 10/24/23.  Seroquel  25mg  was given nightly to assist as well with her insomnia. Patient never required as needed medications for agitation or psychosis.  At the time of discharge, patient was able to manage her symptoms of depression, anxiety, and suicidal ideation. She expressed her willingness in participate in outpatient addiction treatment program. She was able to identify her triggers and develop coping skills to help her manage her emotions. She was also able to develop positive relationships with staff and peers on the unit. Patient is being discharged with a follow-up appointment/referral to an outpatient mental health provider. Patient also has access to a 24-hour crisis hotline and other community resources should she need additional support. Patient has been instructed to take her medications as prescribed, continue participating in therapy, and practice healthy lifestyle habits such as getting enough sleep and exercise. She was also reminded to reach out to her support system and the community resources available to her.  Discharge instructions:  Activity: as tolerated  Diet: heart healthy  # It is recommended to the patient to continue psychiatric medications as prescribed, after discharge from the hospital.     # It  is recommended to the patient to follow up with your outpatient psychiatric provider -instructions on appointment date, time, and address (location) are provided to you in discharge paperwork   # It was discussed with the patient, the impact of alcohol, drugs, tobacco have been there overall psychiatric and medical wellbeing, and total abstinence from substance use was recommended to the patient.   # Prescriptions provided or sent directly to preferred pharmacy at discharge. Patient agreeable to plan. Given opportunity to ask questions. Appears to feel comfortable with discharge.    # In the event of worsening symptoms, the patient is instructed to call the  crisis hotline, [phone], and or go to the nearest ED for appropriate evaluation and treatment of symptoms. To follow-up with primary care provider for other medical issues, concerns and or health care needs.  Musculoskeletal: Strength & Muscle Tone: within normal limits Gait & Station: normal Patient leans: N/A   Psychiatric Specialty Exam:  Presentation  General Appearance:  fair  Eye Contact: Good  Speech: Clear and Coherent  Speech Volume: Normal  Handedness: Right   Mood and Affect  Mood: Irritable  Affect: Congruent   Thought Process  Thought Processes: Coherent  Descriptions of Associations:Intact  Orientation: full  Thought Content:Logical  History of Schizophrenia/Schizoaffective disorder:Yes  Duration of Psychotic Symptoms:Greater than six months  Hallucinations:No data recorded Ideas of Reference:None  Suicidal Thoughts:No data recorded Homicidal Thoughts:No data recorded  Sensorium  Memory: Immediate Fair; Recent Fair; Remote Fair  Judgment: fair  Insight: fair   Executive Functions  Concentration: fair  Attention Span: fair  Recall: [employer] of Knowledge: Fair  Language: Fair   Psychomotor Activity  Psychomotor Activity:No data recorded  Assets  Assets: Manufacturing systems engineer; Housing; Leisure Time   Sleep  Sleep:No data recorded   Physical Exam: Physical Exam ROS Blood pressure 124/74, pulse 65, temperature 98 F (36.7 C), temperature source Oral, resp. rate 17, height 5\' 4"  (1.626 m), weight 68 kg, SpO2 100%. Body mass index is 25.75 kg/m.   Social History   Tobacco Use  Smoking Status Every Day   Types: Cigarettes   Start date: 06/16/1998   Passive exposure: Current  Smokeless Tobacco Former   Tobacco Cessation:  N/A, patient does not currently use tobacco products   Blood Alcohol level:  Lab Results  Component Value Date   [facility] <15 10/20/2023   ETH <10 07/04/2023    Metabolic  Disorder Labs:  Lab Results  Component Value Date   HGBA1C 5.4 10/23/2023   MPG 108.28 10/23/2023   MPG 134.11 12/23/2022   Lab Results  Component Value Date   PROLACTIN 20.7 12/23/2022   Lab Results  Component Value Date   CHOL 123 10/23/2023   TRIG 56 10/23/2023   HDL 48 10/23/2023   CHOLHDL 2.6 10/23/2023   VLDL 11 10/23/2023   LDLCALC 64 10/23/2023   LDLCALC 95 12/23/2022    See Psychiatric Specialty Exam and Suicide Risk Assessment completed by Attending Physician prior to discharge.  Discharge destination:  Home  Is patient on multiple antipsychotic therapies at discharge:  No   Has Patient had three or more failed trials of antipsychotic monotherapy by history:  No  Recommended Plan for Multiple Antipsychotic Therapies: NA   Allergies as of 10/26/2023       Reactions   Tomato Anaphylaxis, Other (See Comments)   Pt reports this as an allergy, but has been eating ketchup and pizza on previous visits w/o any s/s of allergic reaction/anaphylaxis.  Hydrocodone  Itching   Latex Itching, Rash        Medication List     STOP taking these medications    losartan  100 MG tablet Commonly known as: COZAAR    rosuvastatin  40 MG tablet Commonly known as: CRESTOR        TAKE these medications      Indication  ARIPiprazole  20 MG tablet Commonly known as: ABILIFY  Take 1 tablet (20 mg total) by mouth daily. Start taking on: Oct 27, 2023  Indication: MIXED BIPOLAR AFFECTIVE DISORDER   ARIPiprazole  ER 400 MG Srer injection Commonly known as: ABILIFY  MAINTENA Inject 1.5 mLs (300 mg total) into the muscle every 28 (twenty-eight) days. Start taking on: November 21, 2023  Indication: Manic-Depression   metFORMIN  500 MG tablet Commonly known as: GLUCOPHAGE  Take 1 tablet (500 mg total) by mouth 2 (two) times daily with a meal.  Indication: Type 2 Diabetes   QUEtiapine  25 MG tablet Commonly known as: SEROQUEL  Take 1 tablet (25 mg total) by mouth at bedtime.   Indication: Manic-Depression        Follow-up Information     [facility] Go on 11/23/2023.   Why: You have an appointment for medication management services on 11/23/23 at 2:00 pm.   The appointment will be held in person. Contact information: [address] [place] [address] [phone]         [facility] Go on 11/02/2023.   Specialty: Professional Counselor Why: Please go to this provider on 11/02/23 at 9:00 am for therapy services.  You may also go on Monday through Friday, from 9 am to 1 pm. Contact information: [address]  [address] [place] [address] [phone]         [facility]. Call.   Specialty: Behavioral Health Why: A referral has been made on your behalf for the substance abuse intensive outpatient therapy program.  Please call to schedule an assessment. Contact information: [address] [place]  [address] [phone]                Signed: Garner Jury, MD 10/26/2023, 2:38 PM

## 2023-10-26 NOTE — Progress Notes (Signed)
  [facility] Adult Case Management Discharge Plan :  Will you be returning to the same living situation after discharge:  Yes,  pt is returning home at discharge At discharge, do you have transportation home?: Yes,  CSW arranged [employer] taxi for [address] Do you have the ability to pay for your medications: Yes,  pt has active health insurance coverage  Release of information consent forms completed and in the chart;  Patient's signature needed at discharge.  Patient to Follow up at:  Follow-up Information     [facility] Go on 11/23/2023.   Why: You have an appointment for medication management services on 11/23/23 at 2:00 pm.   The appointment will be held in person. Contact information: [address] [place] [address] [phone]         [facility] Go on 11/02/2023.   Specialty: Professional Counselor Why: Please go to this provider on 11/02/23 at 9:00 am for therapy services.  You may also go on Monday through Friday, from 9 am to 1 pm. Contact information: [address]  [address] [place] [address] [phone]         [facility]. Call.   Specialty: Behavioral Health Why: A referral has been made on your behalf for the substance abuse intensive outpatient therapy program.  Please call to schedule an assessment. Contact information: [address] [place]  [phone]                Next level of care provider has access to [facility] Link:no  Safety Planning and Suicide Prevention discussed: Yes,  completed with patient at discharge     Has patient been referred to the Quitline?: Patient refused referral for treatment  Patient has been referred for addiction treatment: Referral has been made to [facility]  Vonzell Guerin, LCSWA 10/26/2023, 2:35 PM

## 2023-10-26 NOTE — [facility] Group Notes (Signed)
 [facility] Group Notes:  (Nursing/MHT/Case Management/Adjunct)  Date:  10/26/2023  Time:  9:32 AM  Type of Therapy:  Nurse Education  Participation Level:  Did Not Attend   True Fuss Azrael Huss 10/26/2023, 9:32 AM

## 2023-10-26 NOTE — Plan of Care (Signed)
  Problem: Education: Goal: Mental status will improve Outcome: Not Progressing   Problem: Education: Goal: Verbalization of understanding the information provided will improve Outcome: Not Progressing

## 2023-10-27 ENCOUNTER — Other Ambulatory Visit: Payer: Self-pay

## 2023-10-27 ENCOUNTER — Other Ambulatory Visit (HOSPITAL_COMMUNITY): Payer: Self-pay

## 2023-10-27 ENCOUNTER — Encounter (HOSPITAL_COMMUNITY): Payer: Self-pay

## 2023-10-27 ENCOUNTER — Telehealth (HOSPITAL_COMMUNITY): Payer: Self-pay

## 2023-10-27 NOTE — Telephone Encounter (Signed)
 Kaitlyn Gray, LCSWA referred Kaitlyn Gray to [facility]. She was in [facility] and has been discharged. Kaitlyn Gray ask that Kaitlyn Gray be called. This therapist calls Kaitlyn Gray, however the message says the number cannot be completed as dialed.  Therapist then sends a My Chart Letter.  Kaitlyn Gola, MS, LMFT, LCAS 10-27-23

## 2023-10-28 ENCOUNTER — Other Ambulatory Visit: Payer: Self-pay

## 2023-10-29 ENCOUNTER — Other Ambulatory Visit: Payer: Self-pay

## 2023-10-29 NOTE — [facility] Group Notes (Signed)

## 2023-11-05 ENCOUNTER — Other Ambulatory Visit: Payer: Self-pay

## 2023-11-05 NOTE — Progress Notes (Signed)
 Pharmacy Patient Advocate Encounter  Insurance verification completed.   The patient is insured through [employer]   Ran test claim for [employer]. Co-pay is $4.  This test claim was processed through [facility]- copay amounts may vary at other pharmacies due to pharmacy/plan contracts, or as the patient moves through the different stages of their insurance plan.

## 2023-11-10 ENCOUNTER — Other Ambulatory Visit: Payer: Self-pay

## 2023-11-16 NOTE — Progress Notes (Signed)
 Patient has not returned calls. Dis-enrolling

## 2023-12-08 ENCOUNTER — Other Ambulatory Visit: Payer: Self-pay

## 2023-12-08 ENCOUNTER — Emergency Department (HOSPITAL_COMMUNITY): Payer: MEDICAID

## 2023-12-08 ENCOUNTER — Emergency Department (HOSPITAL_COMMUNITY)
Admission: EM | Admit: 2023-12-08 | Discharge: 2023-12-09 | Disposition: A | Discharge status: Home / Self Care | Payer: MEDICAID | Attending: Emergency Medicine | Admitting: Emergency Medicine

## 2023-12-08 ENCOUNTER — Encounter (HOSPITAL_COMMUNITY): Payer: Self-pay

## 2023-12-08 DIAGNOSIS — Z7984 Long term (current) use of oral hypoglycemic drugs: Secondary | ICD-10-CM | POA: Insufficient documentation

## 2023-12-08 DIAGNOSIS — D649 Anemia, unspecified: Secondary | ICD-10-CM | POA: Insufficient documentation

## 2023-12-08 DIAGNOSIS — Z765 Malingerer [conscious simulation]: Secondary | ICD-10-CM

## 2023-12-08 DIAGNOSIS — R45851 Suicidal ideations: Secondary | ICD-10-CM | POA: Insufficient documentation

## 2023-12-08 DIAGNOSIS — F1910 Other psychoactive substance abuse, uncomplicated: Secondary | ICD-10-CM | POA: Diagnosis present

## 2023-12-08 DIAGNOSIS — F339 Major depressive disorder, recurrent, unspecified: Secondary | ICD-10-CM | POA: Diagnosis not present

## 2023-12-08 DIAGNOSIS — M25511 Pain in right shoulder: Secondary | ICD-10-CM | POA: Diagnosis not present

## 2023-12-08 DIAGNOSIS — M6281 Muscle weakness (generalized): Secondary | ICD-10-CM | POA: Insufficient documentation

## 2023-12-08 DIAGNOSIS — R443 Hallucinations, unspecified: Secondary | ICD-10-CM | POA: Diagnosis present

## 2023-12-08 DIAGNOSIS — I10 Essential (primary) hypertension: Secondary | ICD-10-CM | POA: Diagnosis not present

## 2023-12-08 DIAGNOSIS — F333 Major depressive disorder, recurrent, severe with psychotic symptoms: Secondary | ICD-10-CM | POA: Insufficient documentation

## 2023-12-08 DIAGNOSIS — Z91040 Latex allergy status: Secondary | ICD-10-CM | POA: Diagnosis not present

## 2023-12-08 DIAGNOSIS — F1410 Cocaine abuse, uncomplicated: Secondary | ICD-10-CM | POA: Diagnosis present

## 2023-12-08 DIAGNOSIS — E119 Type 2 diabetes mellitus without complications: Secondary | ICD-10-CM | POA: Diagnosis not present

## 2023-12-08 DIAGNOSIS — Z8673 Personal history of transient ischemic attack (TIA), and cerebral infarction without residual deficits: Secondary | ICD-10-CM | POA: Insufficient documentation

## 2023-12-08 DIAGNOSIS — D72819 Decreased white blood cell count, unspecified: Secondary | ICD-10-CM | POA: Insufficient documentation

## 2023-12-08 DIAGNOSIS — F1420 Cocaine dependence, uncomplicated: Secondary | ICD-10-CM | POA: Diagnosis present

## 2023-12-08 LAB — COMPREHENSIVE METABOLIC PANEL WITH GFR
ALT: 18 U/L (ref 0–44)
AST: 22 U/L (ref 15–41)
Albumin: 3.2 g/dL — ABNORMAL LOW (ref 3.5–5.0)
Alkaline Phosphatase: 58 U/L (ref 38–126)
Anion gap: 11 (ref 5–15)
BUN: 12 mg/dL (ref 6–20)
CO2: 19 mmol/L — ABNORMAL LOW (ref 22–32)
Calcium: 8.6 mg/dL — ABNORMAL LOW (ref 8.9–10.3)
Chloride: 111 mmol/L (ref 98–111)
Creatinine, Ser: 0.84 mg/dL (ref 0.44–1.00)
GFR, Estimated: >60 mL/min (ref >60)
Glucose, Bld: 103 mg/dL — ABNORMAL HIGH (ref 70–99)
Potassium: 3.7 mmol/L (ref 3.5–5.1)
Sodium: 141 mmol/L (ref 135–145)
Total Bilirubin: 0.2 mg/dL (ref 0.0–1.2)
Total Protein: 6.6 g/dL (ref 6.5–8.1)

## 2023-12-08 LAB — CBC WITH DIFFERENTIAL/PLATELET
Abs Immature Granulocytes: 0.01 10*3/uL (ref 0.00–0.07)
Basophils Absolute: 0.1 10*3/uL (ref 0.0–0.1)
Basophils Relative: 1 %
Eosinophils Absolute: 0.1 10*3/uL (ref 0.0–0.5)
Eosinophils Relative: 2 %
HCT: 30 % — ABNORMAL LOW (ref 36.0–46.0)
Hemoglobin: 8.7 g/dL — ABNORMAL LOW (ref 12.0–15.0)
Immature Granulocytes: 0 %
Lymphocytes Relative: 31 %
Lymphs Abs: 1.1 10*3/uL (ref 0.7–4.0)
MCH: 23.1 pg — ABNORMAL LOW (ref 26.0–34.0)
MCHC: 29 g/dL — ABNORMAL LOW (ref 30.0–36.0)
MCV: 79.8 fL — ABNORMAL LOW (ref 80.0–100.0)
Monocytes Absolute: 0.3 10*3/uL (ref 0.1–1.0)
Monocytes Relative: 9 %
Neutro Abs: 2.1 10*3/uL (ref 1.7–7.7)
Neutrophils Relative %: 57 %
Platelets: 200 10*3/uL (ref 150–400)
RBC: 3.76 MIL/uL — ABNORMAL LOW (ref 3.87–5.11)
RDW: 19.8 % — ABNORMAL HIGH (ref 11.5–15.5)
WBC: 3.6 10*3/uL — ABNORMAL LOW (ref 4.0–10.5)
nRBC: 0 % (ref 0.0–0.2)

## 2023-12-08 LAB — HCG, SERUM, QUALITATIVE: Preg, Serum: NEGATIVE

## 2023-12-08 LAB — ETHANOL: Alcohol, Ethyl (B): <15 mg/dL (ref <15)

## 2023-12-08 LAB — CBG MONITORING, ED: Glucose-Capillary: 129 mg/dL — ABNORMAL HIGH (ref 70–99)

## 2023-12-08 MED ORDER — METFORMIN HCL 500 MG PO TABS
500.0000 mg | ORAL_TABLET | Freq: Two times a day (BID) | ORAL | Status: DC
  Administered 2023-12-08 – 2023-12-09 (×2): 500 mg via ORAL
  Filled 2023-12-08 (×2): qty 1

## 2023-12-08 MED ORDER — ARIPIPRAZOLE 10 MG PO TABS
20.0000 mg | ORAL_TABLET | Freq: Every day | ORAL | Status: DC
  Administered 2023-12-09: 20 mg via ORAL
  Filled 2023-12-08: qty 2

## 2023-12-08 MED ORDER — QUETIAPINE FUMARATE 25 MG PO TABS
25.0000 mg | ORAL_TABLET | Freq: Every day | ORAL | Status: DC
  Administered 2023-12-08: 25 mg via ORAL
  Filled 2023-12-08: qty 1

## 2023-12-08 MED ORDER — ARIPIPRAZOLE 10 MG PO TABS: 15.0000 mg | ORAL_TABLET | Freq: Every day | ORAL | Status: DC

## 2023-12-08 NOTE — Progress Notes (Signed)
 LCSW Progress Note  [id number]   NAVEYA ELLERMAN  12/08/2023  2:43 PM  Description:   Inpatient Psychiatric Referral  Patient was recommended inpatient per Nash Batter NP. There are no available beds at [facility], per [facility] Cherylynn Ernst RN. Patient was referred to the following out of network facilities:   Destination  Service Provider Address Phone Fax  [facility]  [address] [place], [place] [address] [phone]  [facility]  [address], [place] [address] [phone]  [facility] EFAX  [address] [place] [phone]  [facility]  [address] [place] [address] [phone]      Situation ongoing, CSW to continue following and update chart as more information becomes available.      [place] Lis Savitt, MSW, LCSW  12/08/2023 2:43 PM
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
## 2023-12-08 NOTE — ED Notes (Signed)
 Pt wanded by security. Pts personal rolling walker at bedside

## 2023-12-08 NOTE — ED Notes (Signed)
 During night time medication administration and vs collection pt remains cooperative with staff.

## 2023-12-08 NOTE — ED Notes (Signed)
 Patient belonging is in locker 3 and her brown wallet with social security and credit card in security. Patient Rolator Kaitlyn Gray) is in  her room
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
## 2023-12-08 NOTE — ED Notes (Signed)
 Patient has tried to walk out without being seen. Patient taken back to room and was changed in to scrubs.
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
## 2023-12-08 NOTE — ED Notes (Signed)
 Patient refused to let me take her temperature

## 2023-12-08 NOTE — Progress Notes (Signed)
 Pt has been accepted to [facility] TOMORROW 12/09/2023 Bed assignment: Main campus  Pt meets inpatient criteria per: Nash Batter NP  Attending Physician will be Millie Manners, MD  Report can be called to: [phone] (this is a pager, please leave call-back number when giving report)  Pt can arrive after 8 AM  Care Team Notified: Nash Batter NP, Little Justice RN  [place] Herlinda Heady LCSW-A   12/08/2023 2:54 PM

## 2023-12-09 ENCOUNTER — Encounter (HOSPITAL_COMMUNITY): Payer: Self-pay | Admitting: Psychiatry

## 2023-12-09 ENCOUNTER — Other Ambulatory Visit (HOSPITAL_COMMUNITY): Payer: Self-pay

## 2023-12-09 DIAGNOSIS — Z765 Malingerer [conscious simulation]: Secondary | ICD-10-CM

## 2023-12-09 MED ORDER — LOSARTAN POTASSIUM 50 MG PO TABS
100.0000 mg | ORAL_TABLET | Freq: Once | ORAL | Status: AC
  Administered 2023-12-09: 100 mg via ORAL
  Filled 2023-12-09: qty 2

## 2023-12-09 MED ORDER — QUETIAPINE FUMARATE 25 MG PO TABS
25.0000 mg | ORAL_TABLET | Freq: Every day | ORAL | 0 refills | Status: DC
  Filled 2023-12-09: qty 15, 15d supply, fill #0

## 2023-12-09 MED ORDER — ARIPIPRAZOLE 20 MG PO TABS
20.0000 mg | ORAL_TABLET | Freq: Every day | ORAL | 0 refills | Status: DC
  Filled 2023-12-09: qty 15, 15d supply, fill #0

## 2023-12-09 NOTE — ED Notes (Signed)
 Pt ambulated to and from the bathroom with her walker during shift report. Was unable to provide urine sample. Pt made aware of need for urine specimen.
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
## 2023-12-09 NOTE — ED Notes (Signed)
 Pt continues to sleep at this time. Has slept uninterrupted throughout the night. Both side rails are up at this pt d/t concern for possible fall as pt has been rolling in bed. Sitter remains with pt.
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
## 2023-12-09 NOTE — Progress Notes (Signed)
 Patient has been deemed to no longer meet inpatient psychiatric criteria and is cleared for discharge.  CSW received consult for patient as she needs transportation home.   CSW notified Flavia, RN that patient can have either a cab voucher or bus pass home.  Niels Portugal, MSW, LCSW Transitions of Care  Clinical Social Worker II [phone]

## 2023-12-09 NOTE — ED Notes (Signed)
 Pt ambulatory to the bathroom following morning VS. Attempted to collect urine sample to which pt responded she already peed. Pt offered hygiene products by sitter as she is currently on her menstrual cycle.

## 2023-12-09 NOTE — ED Notes (Addendum)
 Jattir, RN from [facility] was notified at 11:22 the patient not be coming to there facility.

## 2023-12-13 ENCOUNTER — Encounter (HOSPITAL_COMMUNITY): Payer: Self-pay

## 2023-12-13 ENCOUNTER — Emergency Department (HOSPITAL_COMMUNITY): Payer: MEDICAID

## 2023-12-13 ENCOUNTER — Other Ambulatory Visit: Payer: Self-pay

## 2023-12-13 ENCOUNTER — Emergency Department (HOSPITAL_COMMUNITY)
Admission: EM | Admit: 2023-12-13 | Discharge: 2023-12-13 | Disposition: A | Discharge status: Home / Self Care | Payer: MEDICAID | Attending: Emergency Medicine | Admitting: Emergency Medicine

## 2023-12-13 DIAGNOSIS — R4781 Slurred speech: Secondary | ICD-10-CM | POA: Diagnosis present

## 2023-12-13 DIAGNOSIS — Z7984 Long term (current) use of oral hypoglycemic drugs: Secondary | ICD-10-CM | POA: Diagnosis not present

## 2023-12-13 DIAGNOSIS — I10 Essential (primary) hypertension: Secondary | ICD-10-CM | POA: Diagnosis not present

## 2023-12-13 DIAGNOSIS — Z8673 Personal history of transient ischemic attack (TIA), and cerebral infarction without residual deficits: Secondary | ICD-10-CM | POA: Insufficient documentation

## 2023-12-13 DIAGNOSIS — Z91040 Latex allergy status: Secondary | ICD-10-CM | POA: Diagnosis not present

## 2023-12-13 DIAGNOSIS — R202 Paresthesia of skin: Secondary | ICD-10-CM | POA: Insufficient documentation

## 2023-12-13 DIAGNOSIS — I69354 Hemiplegia and hemiparesis following cerebral infarction affecting left non-dominant side: Secondary | ICD-10-CM

## 2023-12-13 DIAGNOSIS — Z79899 Other long term (current) drug therapy: Secondary | ICD-10-CM | POA: Diagnosis not present

## 2023-12-13 DIAGNOSIS — R2990 Unspecified symptoms and signs involving the nervous system: Secondary | ICD-10-CM | POA: Diagnosis not present

## 2023-12-13 DIAGNOSIS — F1490 Cocaine use, unspecified, uncomplicated: Secondary | ICD-10-CM

## 2023-12-13 DIAGNOSIS — E119 Type 2 diabetes mellitus without complications: Secondary | ICD-10-CM | POA: Diagnosis not present

## 2023-12-13 DIAGNOSIS — F1410 Cocaine abuse, uncomplicated: Secondary | ICD-10-CM | POA: Insufficient documentation

## 2023-12-13 LAB — COMPREHENSIVE METABOLIC PANEL WITH GFR
ALT: 10 U/L (ref 0–44)
AST: 16 U/L (ref 15–41)
Albumin: 3 g/dL — ABNORMAL LOW (ref 3.5–5.0)
Alkaline Phosphatase: 52 U/L (ref 38–126)
Anion gap: 9 (ref 5–15)
BUN: 10 mg/dL (ref 6–20)
CO2: 23 mmol/L (ref 22–32)
Calcium: 8.3 mg/dL — ABNORMAL LOW (ref 8.9–10.3)
Chloride: 106 mmol/L (ref 98–111)
Creatinine, Ser: 0.88 mg/dL (ref 0.44–1.00)
GFR, Estimated: >60 mL/min (ref >60)
Glucose, Bld: 97 mg/dL (ref 70–99)
Potassium: 3.5 mmol/L (ref 3.5–5.1)
Sodium: 138 mmol/L (ref 135–145)
Total Bilirubin: 0.4 mg/dL (ref 0.0–1.2)
Total Protein: 6.1 g/dL — ABNORMAL LOW (ref 6.5–8.1)

## 2023-12-13 LAB — DIFFERENTIAL
Abs Immature Granulocytes: 0 10*3/uL (ref 0.00–0.07)
Basophils Absolute: 0 10*3/uL (ref 0.0–0.1)
Basophils Relative: 1 %
Eosinophils Absolute: 0.1 10*3/uL (ref 0.0–0.5)
Eosinophils Relative: 2 %
Immature Granulocytes: 0 %
Lymphocytes Relative: 49 %
Lymphs Abs: 1.7 10*3/uL (ref 0.7–4.0)
Monocytes Absolute: 0.3 10*3/uL (ref 0.1–1.0)
Monocytes Relative: 8 %
Neutro Abs: 1.4 10*3/uL — ABNORMAL LOW (ref 1.7–7.7)
Neutrophils Relative %: 40 %

## 2023-12-13 LAB — CBC
HCT: 28 % — ABNORMAL LOW (ref 36.0–46.0)
Hemoglobin: 8 g/dL — ABNORMAL LOW (ref 12.0–15.0)
MCH: 22.9 pg — ABNORMAL LOW (ref 26.0–34.0)
MCHC: 28.6 g/dL — ABNORMAL LOW (ref 30.0–36.0)
MCV: 80.2 fL (ref 80.0–100.0)
Platelets: 297 10*3/uL (ref 150–400)
RBC: 3.49 MIL/uL — ABNORMAL LOW (ref 3.87–5.11)
RDW: 18.8 % — ABNORMAL HIGH (ref 11.5–15.5)
WBC: 3.4 10*3/uL — ABNORMAL LOW (ref 4.0–10.5)
nRBC: 0 % (ref 0.0–0.2)

## 2023-12-13 LAB — I-STAT CHEM 8, ED
BUN: 10 mg/dL (ref 6–20)
Calcium, Ion: 1.13 mmol/L — ABNORMAL LOW (ref 1.15–1.40)
Chloride: 106 mmol/L (ref 98–111)
Creatinine, Ser: 0.9 mg/dL (ref 0.44–1.00)
Glucose, Bld: 98 mg/dL (ref 70–99)
HCT: 27 % — ABNORMAL LOW (ref 36.0–46.0)
Hemoglobin: 9.2 g/dL — ABNORMAL LOW (ref 12.0–15.0)
Potassium: 3.4 mmol/L — ABNORMAL LOW (ref 3.5–5.1)
Sodium: 142 mmol/L (ref 135–145)
TCO2: 23 mmol/L (ref 22–32)

## 2023-12-13 LAB — RAPID URINE DRUG SCREEN, HOSP PERFORMED
Amphetamines: NOT DETECTED
Barbiturates: NOT DETECTED
Benzodiazepines: NOT DETECTED
Cocaine: POSITIVE — AB
Opiates: NOT DETECTED
Tetrahydrocannabinol: NOT DETECTED

## 2023-12-13 LAB — CBG MONITORING, ED: Glucose-Capillary: 92 mg/dL (ref 70–99)

## 2023-12-13 LAB — ETHANOL: Alcohol, Ethyl (B): <15 mg/dL (ref <15)

## 2023-12-13 LAB — HCG, SERUM, QUALITATIVE: Preg, Serum: NEGATIVE

## 2023-12-13 LAB — PROTIME-INR
INR: 1.1 (ref 0.8–1.2)
Prothrombin Time: 14.5 s (ref 11.4–15.2)

## 2023-12-13 LAB — APTT: aPTT: 30 s (ref 24–36)

## 2023-12-13 MED ORDER — IOHEXOL 350 MG/ML SOLN
115.0000 mL | Freq: Once | INTRAVENOUS | Status: AC | PRN
  Administered 2023-12-13: 115 mL via INTRAVENOUS

## 2023-12-13 MED ORDER — LORAZEPAM 2 MG/ML IJ SOLN
0.5000 mg | Freq: Once | INTRAMUSCULAR | Status: AC
  Administered 2023-12-13: 0.5 mg via INTRAVENOUS
  Filled 2023-12-13: qty 1

## 2023-12-13 MED ORDER — IOHEXOL 350 MG/ML SOLN: 75.0000 mL | Freq: Once | INTRAVENOUS | Status: DC | PRN

## 2023-12-13 NOTE — ED Notes (Signed)
 Patient transported to MRI

## 2023-12-13 NOTE — Code Documentation (Signed)
 Stroke Response Nurse Documentation Code Documentation  Lashannon LEVITA MONICAL is a 48 y.o. female arriving to [name]  via [facility] as Code Stroke Activation with LKW yesterday 6/28 1900. Complaints of left weakness, slurred speech.   Stroke team met pt on way to CT. NIH 11, see flowsheet for details. CT, CTA/P completed. OOW for TNK, no thrombectomy as no LVO. Care plan: q2h NIH and vital signs x12h then unit routine. Bedside handoff with ED RN Augusta Prescott, Chiquita POUR  Rapid Response RN

## 2023-12-13 NOTE — ED Notes (Signed)
 Patient took out first IV and didn't tell staff. This RN placed another IV.
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
## 2023-12-13 NOTE — Discharge Instructions (Signed)
 You were seen in the Emergency Department for speech and numbness The CAT scans and MRIs taken did not show an acute stroke but do show some old findings consistent with stroke in the past He tested positive for cocaine You should follow-up with your primary care doctor and neurologist within 1 week for reevaluation Return to the emergency department for severe weakness, headaches, new numbness or any other concerns

## 2023-12-13 NOTE — ED Triage Notes (Signed)
 Patient bib EMS from home with complaints of left arm and leg weakness and numbness, and slurred speech, Also has complaints feeling like face is drooping on the right side. EMS reports hypertension. Last seen normal was 6/28 1900 and patient states she was asleep until 1400 today when she woke up and noticed deficits.

## 2023-12-13 NOTE — ED Provider Notes (Signed)
 Care assumed from Dr. Pamella.  Time of transfer of care, patient waiting MRI to rule out acute stroke as the cause of her symptoms today.  If MRI is reassuring and negative, plan of care early to discharge home for outpatient follow-up.  If stroke is seen, anticipate touching base with neurology for likely admission.  11:17 PM MRI returned with no evidence of acute stroke.  I went to reassess patient and she agrees with plan for discharge home and outpatient neurology and PCP follow-up.  Will discharge for outpatient follow-up after reassuring workup and neurology recommendation for discharge as MRI was negative.   Clinical Impression: 1. Slurred speech   2. History of stroke   3. Cocaine use     Disposition: Discharge  Condition: Good  I have discussed the results, Dx and Tx plan with the pt(& family if present). He/she/they expressed understanding and agree(s) with the plan. Discharge instructions discussed at great length. Strict return precautions discussed and pt &/or family have verbalized understanding of the instructions. No further questions at time of discharge.    New Prescriptions   No medications on file    Follow Up: Tanda Bleacher, MD [id number] S. [address] [address] [phone]  Schedule an appointment as soon as possible for a visit in 1 week      Brittin Janik, Lonni PARAS, MD 12/13/23 2318

## 2023-12-13 NOTE — ED Provider Notes (Signed)
 [facility] EMERGENCY DEPARTMENT AT [facility] Provider Note   CSN: [id number] Arrival date & time: 12/13/23  1605     Patient presents with: Code Stroke   Kaitlyn Gray is a 48 y.o. female.  With a history of stroke with left-sided deficits, hypertension and diabetes polysubstance use bipolar disorder who presents to the ED given concern for stroke.  Patient went to bed around 1900 last night who was in her normal state of health.  When she awoke at 2 PM this morning she noticed some slurred speech and worsening numbness on her left side.  New from baseline.  Initial fast ED score was 2 but EMS had errantly activated as code stroke.  Patient still has some left-sided tingling but no other complaints on my exam.  Admits to recent cocaine use.  Was recently seen in the ED for psychiatric reasons and offered psychiatric inpatient hospitalization at [facility] but declined.   HPI     Prior to Admission medications   Medication Sig Start Date End Date Taking? Authorizing Provider  ARIPiprazole  (ABILIFY ) 20 MG tablet Take 1 tablet (20 mg total) by mouth daily for 15 days. 12/09/23 12/24/23 Yes Mannie Jerel PARAS, NP  ARIPiprazole  ER (ABILIFY  MAINTENA) 400 MG SRER injection Inject 1.5 mLs (300 mg total) into the muscle every 28 (twenty-eight) days. 11/21/23  Yes Paliy, Alisa, MD  QUEtiapine  (SEROQUEL ) 25 MG tablet Take 1 tablet (25 mg total) by mouth at bedtime. 12/09/23  Yes Mannie Jerel PARAS, NP  ferrous sulfate  325 (65 FE) MG EC tablet Take 325 mg by mouth daily. Patient not taking: Reported on 12/13/2023    [provider]  gabapentin  (NEURONTIN ) 100 MG capsule Take 100 mg by mouth 3 (three) times daily. Patient not taking: Reported on 12/13/2023    [provider]  losartan  (COZAAR ) 100 MG tablet Take 100 mg by mouth daily. Patient not taking: Reported on 12/13/2023    [provider]  metFORMIN  (GLUCOPHAGE ) 500 MG tablet Take 1 tablet (500 mg total) by mouth  2 (two) times daily with a meal. Patient not taking: Reported on 12/13/2023 10/26/23   Paliy, Alisa, MD    Allergies: Tomato, Hydrocodone , and Latex    Review of Systems  Updated Vital Signs BP (!) 155/99   Pulse (!) 55   Temp 97.6 F (36.4 C) (Oral)   Resp 19   Wt 67.2 kg   LMP 12/08/2023   SpO2 100%   BMI 25.43 kg/m   Physical Exam Vitals and nursing note reviewed.  HENT:     Head: Normocephalic and atraumatic.   Eyes:     Pupils: Pupils are equal, round, and reactive to light.    Cardiovascular:     Rate and Rhythm: Normal rate and regular rhythm.  Pulmonary:     Effort: Pulmonary effort is normal.     Breath sounds: Normal breath sounds.  Abdominal:     Palpations: Abdomen is soft.     Tenderness: There is no abdominal tenderness.   Skin:    General: Skin is warm and dry.   Neurological:     Mental Status: She is alert.     Comments: Per neuro note: She is awake alert oriented to self, the fact she is in the hospital, could not tell me the correct month but said it is July which is close enough. Speaking in baby like speech with some perseveration which she has done during prior presentations as well. Cranial nerves  II to XII-with the exception of volitional holding of her face on the left, intact. Motor exam: Left arm drifts but never hits her face.  Resists passively lifting the left leg-does not go antigravity on the left leg.  Right side appears to be full strength. Sensory exam: Complete sensory loss on the left Coordination difficult to assess given her mentation     Psychiatric:        Mood and Affect: Mood normal.     (all labs ordered are listed, but only abnormal results are displayed) Labs Reviewed  RAPID URINE DRUG SCREEN, HOSP PERFORMED - Abnormal; Notable for the following components:      Result Value   Cocaine POSITIVE (*)    All other components within normal limits  I-STAT CHEM 8, ED - Abnormal; Notable for the following components:    Potassium 3.4 (*)    Calcium , Ion 1.13 (*)    Hemoglobin 9.2 (*)    HCT 27.0 (*)    All other components within normal limits  ETHANOL  PROTIME-INR  APTT  CBC  DIFFERENTIAL  COMPREHENSIVE METABOLIC PANEL WITH GFR  HCG, SERUM, QUALITATIVE  CBG MONITORING, ED  I-STAT CHEM 8, ED    EKG: None  Radiology: CT ANGIO HEAD NECK W WO CM W PERF (CODE STROKE) Result Date: 12/13/2023 CLINICAL DATA:  Provided history: Neuro deficit, acute, stroke suspected. EXAM: CT ANGIOGRAPHY HEAD AND NECK CT PERFUSION BRAIN TECHNIQUE: Multidetector CT imaging of the head and neck was performed using the standard protocol during bolus administration of intravenous contrast. Multiplanar CT image reconstructions and MIPs were obtained to evaluate the vascular anatomy. Carotid stenosis measurements (when applicable) are obtained utilizing NASCET criteria, using the distal internal carotid diameter as the denominator. Multiphase CT imaging of the brain was performed following IV bolus contrast injection. Subsequent parametric perfusion maps were calculated using RAPID software. RADIATION DOSE REDUCTION: This exam was performed according to the departmental dose-optimization program which includes automated exposure control, adjustment of the mA and/or kV according to patient size and/or use of iterative reconstruction technique. CONTRAST:  115mL OMNIPAQUE  IOHEXOL  350 MG/ML SOLN, <See Chart> OMNIPAQUE  IOHEXOL  350 MG/ML SOLN COMPARISON:  Noncontrast head CT performed earlier today 12/13/2023. CT angiogram head/neck 07/04/2023. FINDINGS: CTA NECK FINDINGS Aortic arch: Standard aortic branching. The visible thoracic aorta is normal in caliber. Streak/beam hardening artifact arising from a dense contrast bolus partially obscures the right subclavian artery. Within this limitation, there is no appreciable hemodynamically significant innominate or proximal subclavian artery stenosis. Right carotid system: CCA and ICA patent within  the neck. Estimated 50-60% stenosis of the proximal to mid cervical ICA, unchanged. Partially retropharyngeal course of the cervical ICA. Left carotid system: CCA and ICA patent within the neck without stenosis or significant atherosclerotic disease. Partially retropharyngeal course of the cervical ICA Vertebral arteries: Codominant and patent within the neck without stenosis or significant atherosclerotic disease. Skeleton: Nonspecific reversal of the expected cervical lordosis. Cervical spondylosis. No acute fracture or aggressive osseous lesion. Other neck: No neck mass or cervical lymphadenopathy. Upper chest: No consolidation within the imaged lung apices. Review of the MIP images confirms the above findings CTA HEAD FINDINGS Anterior circulation: The intracranial internal carotid arteries are patent. Atherosclerotic plaque within both vessels. Most notably, there is a moderate stenosis within the left cavernous segment which has progressed from the prior CTA (series 5, image 134). The M1 middle cerebral arteries are patent. No M2 proximal branch occlusion or high-grade proximal stenosis. The anterior cerebral arteries are  patent. Unchanged multifocal moderate-to-severe stenoses of third order branches of the bilateral anterior cerebral arteries. No intracranial aneurysm is identified. Posterior circulation: The intracranial vertebral arteries are patent. The basilar artery is patent. The posterior cerebral arteries are patent. Posterior communicating arteries are diminutive or absent, bilaterally. Persistent severe right PCA stenosis at the P2/P3 junction. As before, there is asymmetrically diminished enhancement of distal right PCA branches. Posterior communicating arteries are diminutive or absent, bilaterally. Venous sinuses: Within the limitations of contrast timing, no convincing thrombus. Anatomic variants: As described. Review of the MIP images confirms the above findings CT Brain Perfusion Findings:  CBF (<30%) Volume: 0mL Perfusion (Tmax>6.0s) volume: 3mL Mismatch Volume: 3mL Infarction Location:None identified. No emergent proximal intracranial large vessel occlusion identified. These results were communicated to Dr. Voncile at 5:16 pmon 6/29/2025by text page via the [facility] messaging system. IMPRESSION: CTA neck: 1. The common carotid and internal carotid arteries are patent in the neck. Estimated 50-60% stenosis of the proximal-to-mid cervical right ICA, unchanged. 2. Vertebral arteries patent within the neck without stenosis or significant atherosclerotic disease. CTA head: 1. No emergent proximal intracranial large vessel occlusion identified. 2. Intracranial atherosclerotic disease with multifocal stenoses, most notably as follows. 3. Progressive moderate stenosis within the left ICA cavernous segment. 4. Moderate-to-severe stenoses within bilateral third order anterior cerebral artery branches, unchanged. 5. Redemonstrated severe right PCA stenosis at the P2/P3 junction. Asymmetrically diminished enhancement of distal right PCA branches, as before. CT perfusion head: The CT perfusion software identifies a 3 mL region of hypoperfused parenchyma in the region of the anterior left frontal lobe (utilizing the Tmax>6 seconds threshold). No core infarct is identified. Reported mismatch volume: 3 mL. Electronically Signed   By: Rockey Childs D.O.   On: 12/13/2023 17:16   CT HEAD CODE STROKE WO CONTRAST Result Date: 12/13/2023 CLINICAL DATA:  Code stroke. Provided history: Neuro deficit, acute, stroke suspected. Suspected left-sided deficit. EXAM: CT HEAD WITHOUT CONTRAST TECHNIQUE: Contiguous axial images were obtained from the base of the skull through the vertex without intravenous contrast. RADIATION DOSE REDUCTION: This exam was performed according to the departmental dose-optimization program which includes automated exposure control, adjustment of the mA and/or kV according to patient size and/or use of  iterative reconstruction technique. COMPARISON:  Head CT 09/17/2023. FINDINGS: Brain: Mild generalized parenchymal atrophy. Known chronic cortical/subcortical infarcts within the high perirolandic left frontoparietal lobes and within the right occipital lobe. Background moderate patchy and ill-defined hypoattenuation within the cerebral white matter, nonspecific but compatible chronic small vessel ischemic disease. Known small chronic infarcts within the pons and cerebellum, better appreciated on the prior MRI of 07/04/2023. There is no acute intracranial hemorrhage. No acute demarcated cortical infarct. No extra-axial fluid collection. No evidence of an intracranial mass. No midline shift. Vascular: No hyperdense vessel.  Atherosclerotic calcifications. Skull: No calvarial fracture or aggressive osseous lesion. Sinuses/Orbits: No mass or acute finding within the imaged orbits. Mild mucosal thickening within the right maxillary sinus at the imaged levels. Small mucous retention cysts, and trace background mucosal thickening, within the left maxillary sinus at the imaged levels. Trace mucosal thickening within the left sphenoid sinus. ASPECTS [facility] Stroke Program Early CT Score) - Ganglionic level infarction (caudate, lentiform nuclei, internal capsule, insula, M1-M3 cortex): 7 - Supraganglionic infarction (M4-M6 cortex): 3 Total score (0-10 with 10 being normal): 10 No evidence of an acute intracranial abnormality. These results were communicated to Dr. Voncile at 4:24 pmon 6/29/2025by text page via the [facility] messaging system. IMPRESSION: 1.  No evidence  of an acute intracranial abnormality. 2. Parenchymal atrophy, chronic small vessel ischemic disease and chronic infarcts, as described. 3. Mild paranasal sinus disease at the imaged levels. Electronically Signed   By: Rockey Childs D.O.   On: 12/13/2023 16:25     Procedures   Medications Ordered in the ED  iohexol  (OMNIPAQUE ) 350 MG/ML injection 75 mL (  Intravenous Canceled Entry 12/13/23 1634)  iohexol  (OMNIPAQUE ) 350 MG/ML injection 115 mL (115 mLs Intravenous Contrast Given 12/13/23 1634)    Clinical Course as of 12/13/23 2059  Sun Dec 13, 2023  2056 CT head CTA head and neck show some chronic findings consistent with known history of old stroke but no acute changes.  Patient remained stable here with no new focal neurologic deficit I, Ozell Marine DO, am transitioning care of this patient to the oncoming provider pending MRI for reevaluation disposition.  Per neurology, discharge would be appropriate if MRI negative for acute process [MP]    Clinical Course User Index [MP] Kaitlyn Ozell LABOR, DO                                 Medical Decision Making 48 year old female with history as above including history of prior stroke polysubstance use and psychiatric illness presenting given concern for stroke.  Last known well time 1900 last night.  New left-sided deficits and slurred speech earlier.  Clear fluent speech today.  Code stroke was activated.  Patient examined with neurology team.  Initial NIH stroke scale score of 11 as detailed in neurology note.  No large vessel occlusion.  Will obtain MRI to evaluate for stroke in the rest of laboratory workup including UDS for stroke protocol.  Amount and/or Complexity of Data Reviewed Radiology: ordered.        Final diagnoses:  Slurred speech  History of stroke  Cocaine use    ED Discharge Orders     None          Kaitlyn Ozell LABOR, DO 12/13/23 2059

## 2023-12-13 NOTE — ED Notes (Signed)
 Patient has ambulated to the bathroom numerous times independently without any issues.

## 2023-12-28 ENCOUNTER — Encounter (HOSPITAL_COMMUNITY): Payer: Self-pay | Admitting: *Deleted

## 2023-12-28 ENCOUNTER — Emergency Department (HOSPITAL_COMMUNITY)
Admission: EM | Admit: 2023-12-28 | Discharge: 2023-12-29 | Disposition: A | Discharge status: Other Acute Inpatient Hospital | Payer: MEDICAID | Attending: Emergency Medicine | Admitting: Emergency Medicine

## 2023-12-28 ENCOUNTER — Other Ambulatory Visit: Payer: Self-pay

## 2023-12-28 DIAGNOSIS — F319 Bipolar disorder, unspecified: Secondary | ICD-10-CM | POA: Insufficient documentation

## 2023-12-28 DIAGNOSIS — Z91040 Latex allergy status: Secondary | ICD-10-CM | POA: Insufficient documentation

## 2023-12-28 DIAGNOSIS — Z7984 Long term (current) use of oral hypoglycemic drugs: Secondary | ICD-10-CM | POA: Insufficient documentation

## 2023-12-28 DIAGNOSIS — E119 Type 2 diabetes mellitus without complications: Secondary | ICD-10-CM | POA: Diagnosis not present

## 2023-12-28 DIAGNOSIS — R443 Hallucinations, unspecified: Secondary | ICD-10-CM | POA: Insufficient documentation

## 2023-12-28 DIAGNOSIS — F209 Schizophrenia, unspecified: Secondary | ICD-10-CM | POA: Diagnosis not present

## 2023-12-28 DIAGNOSIS — R45851 Suicidal ideations: Secondary | ICD-10-CM | POA: Insufficient documentation

## 2023-12-28 DIAGNOSIS — F1410 Cocaine abuse, uncomplicated: Secondary | ICD-10-CM | POA: Diagnosis not present

## 2023-12-28 DIAGNOSIS — R55 Syncope and collapse: Secondary | ICD-10-CM | POA: Insufficient documentation

## 2023-12-28 DIAGNOSIS — F1490 Cocaine use, unspecified, uncomplicated: Secondary | ICD-10-CM

## 2023-12-28 DIAGNOSIS — F1910 Other psychoactive substance abuse, uncomplicated: Secondary | ICD-10-CM | POA: Diagnosis present

## 2023-12-28 DIAGNOSIS — R079 Chest pain, unspecified: Secondary | ICD-10-CM | POA: Insufficient documentation

## 2023-12-28 LAB — BASIC METABOLIC PANEL WITH GFR
Anion gap: 8 (ref 5–15)
BUN: 25 mg/dL — ABNORMAL HIGH (ref 6–20)
CO2: 25 mmol/L (ref 22–32)
Calcium: 9 mg/dL (ref 8.9–10.3)
Chloride: 109 mmol/L (ref 98–111)
Creatinine, Ser: 1.06 mg/dL — ABNORMAL HIGH (ref 0.44–1.00)
GFR, Estimated: >60 mL/min (ref >60)
Glucose, Bld: 109 mg/dL — ABNORMAL HIGH (ref 70–99)
Potassium: 3.6 mmol/L (ref 3.5–5.1)
Sodium: 142 mmol/L (ref 135–145)

## 2023-12-28 LAB — ETHANOL: Alcohol, Ethyl (B): <15 mg/dL (ref <15)

## 2023-12-28 LAB — CBC WITH DIFFERENTIAL/PLATELET
Abs Immature Granulocytes: 0.01 K/uL (ref 0.00–0.07)
Basophils Absolute: 0 K/uL (ref 0.0–0.1)
Basophils Relative: 0 %
Eosinophils Absolute: 0.1 K/uL (ref 0.0–0.5)
Eosinophils Relative: 2 %
HCT: 27.8 % — ABNORMAL LOW (ref 36.0–46.0)
Hemoglobin: 8.1 g/dL — ABNORMAL LOW (ref 12.0–15.0)
Immature Granulocytes: 0 %
Lymphocytes Relative: 27 %
Lymphs Abs: 1.2 K/uL (ref 0.7–4.0)
MCH: 23.1 pg — ABNORMAL LOW (ref 26.0–34.0)
MCHC: 29.1 g/dL — ABNORMAL LOW (ref 30.0–36.0)
MCV: 79.2 fL — ABNORMAL LOW (ref 80.0–100.0)
Monocytes Absolute: 0.4 K/uL (ref 0.1–1.0)
Monocytes Relative: 9 %
Neutro Abs: 2.9 K/uL (ref 1.7–7.7)
Neutrophils Relative %: 62 %
Platelets: 305 K/uL (ref 150–400)
RBC: 3.51 MIL/uL — ABNORMAL LOW (ref 3.87–5.11)
RDW: 17.5 % — ABNORMAL HIGH (ref 11.5–15.5)
WBC: 4.7 K/uL (ref 4.0–10.5)
nRBC: 0 % (ref 0.0–0.2)

## 2023-12-28 LAB — HCG, SERUM, QUALITATIVE: Preg, Serum: NEGATIVE

## 2023-12-28 LAB — SALICYLATE LEVEL: Salicylate Lvl: <7 mg/dL — ABNORMAL LOW (ref 7.0–30.0)

## 2023-12-28 LAB — ACETAMINOPHEN LEVEL: Acetaminophen (Tylenol), Serum: <10 ug/mL — ABNORMAL LOW (ref 10–30)

## 2023-12-28 LAB — TROPONIN I (HIGH SENSITIVITY): Troponin I (High Sensitivity): 4 ng/L (ref <18)

## 2023-12-28 MED ORDER — SODIUM CHLORIDE 0.9 % IV BOLUS
1000.0000 mL | Freq: Once | INTRAVENOUS | Status: AC
  Administered 2023-12-28: 1000 mL via INTRAVENOUS

## 2023-12-28 MED ORDER — LORAZEPAM 1 MG PO TABS
2.0000 mg | ORAL_TABLET | Freq: Once | ORAL | Status: AC
  Administered 2023-12-28: 2 mg via ORAL
  Filled 2023-12-28: qty 2

## 2023-12-28 MED ORDER — LORAZEPAM 2 MG/ML IJ SOLN: 2.0000 mg | Freq: Once | INTRAMUSCULAR | Status: AC

## 2023-12-28 MED ORDER — QUETIAPINE FUMARATE 50 MG PO TABS
25.0000 mg | ORAL_TABLET | Freq: Every day | ORAL | Status: DC
  Administered 2023-12-28: 25 mg via ORAL
  Filled 2023-12-28: qty 1

## 2023-12-28 MED ORDER — METFORMIN HCL 500 MG PO TABS
500.0000 mg | ORAL_TABLET | Freq: Two times a day (BID) | ORAL | Status: DC
  Administered 2023-12-28 – 2023-12-29 (×2): 500 mg via ORAL
  Filled 2023-12-28 (×2): qty 1

## 2023-12-28 MED ORDER — LOSARTAN POTASSIUM 50 MG PO TABS
100.0000 mg | ORAL_TABLET | Freq: Every day | ORAL | Status: DC
  Administered 2023-12-28 – 2023-12-29 (×2): 100 mg via ORAL
  Filled 2023-12-28 (×2): qty 2

## 2023-12-28 MED ORDER — FERROUS SULFATE 325 (65 FE) MG PO TABS
325.0000 mg | ORAL_TABLET | Freq: Every day | ORAL | Status: DC
  Administered 2023-12-28 – 2023-12-29 (×2): 325 mg via ORAL
  Filled 2023-12-28 (×2): qty 1

## 2023-12-28 NOTE — ED Notes (Signed)
 Kaitlyn Gray has been in the lobby after triage sleeping   she was woke up and was advised we had the bus pass she wanted    she then stated that she wanted to be seen by the doctor now    she was advised we would hold her bus pass for her

## 2023-12-28 NOTE — ED Triage Notes (Addendum)
 BIB [facility] from scene where by stander called EMS for pt rolling around on the ground. Pt verbalized not wanting transport, not wanting to come to ED, not wanting to stay, wants a bus pass to go back. Denies sx or complaints. Does mention just got hot. EDP in to see pt in triage. Pt agreeable to stay. Able to leave if she wants. Alert, NAD, calm, interactive. H/o schizophrenia. Bruzism noted. VSS. CBG 123. 170/80, HR 60, SSPo2 98% RA. Labile decisions. No complaints at this time. No bus passes available at this time.

## 2023-12-28 NOTE — ED Notes (Signed)
 Tried to get an EKG on pt. Pt stated that she is cold, she is tired, and she does not want it. RN notified.

## 2023-12-28 NOTE — Consult Note (Signed)
 [facility] Psychiatric Consult Initial  Patient Name: .Kaitlyn Gray  MRN: [id number]  DOB: Jan 29, 1976  Consult Order details:  Orders (From admission, onward)     Start     Ordered   12/28/23 1243  CONSULT TO CALL ACT TEAM       Ordering Provider: Pamella Ozell LABOR, DO  Provider:  (Not yet assigned)  Question:  Reason for Consult?  Answer:  Psych consult   12/28/23 1242             Mode of Visit: In person    Psychiatry Consult Evaluation  Service Date: December 28, 2023 LOS:  LOS: 0 days  Chief Complaint I hear voices, telling me to kill myself  Primary Psychiatric Diagnoses  Schizophrenia 2.  Bipolar I 3.  Polysubstance abuse  Assessment  Kaitlyn Gray is a 48 y.o. female admitted: Presented to the EDfor 12/28/2023 10:06 AM f after she was found rolling around on the ground . Patient reported not wanting to come to ED  but reported that just got hot. She carries the psychiatric diagnoses of Schizophrenia, Bipolar and polysubstance abuse and has  past medical history of  acute illness.   Her current presentation of disorganized thinking and behaviors command /hallucinations  is most consistent with her psychiatric/mental health history.  She meets criteria for inpatient treatment for stabilization based on presenting symptoms and past treatment hx.  Current outpatient psychotropic medications include Abilify  Maintena 400 mg IM Monthly,reports she only gets it when she comes to the hospital, and historically she has had a therapeutic  response to this medication.  She also was prescribed Gabapentin  and Seroquel .  She was not  compliant with medications prior to admission as evidenced by her presenting symptoms and her self report. On initial examination, patient is disorganized, restless, anxious and experiencing poor self care. Please see plan below for detailed recommendations.   Diagnoses:  Active Hospital problems: Active Problems:   Polysubstance abuse (HCC)    Hallucinations    Plan   ## Psychiatric Medication Recommendations:  Patient received Ativan  2 mg to address her increased anxiety and restlessness and to ease assessment. Additional medications to be reviewed.  ## Medical Decision Making Capacity: Not specifically addressed in this encounter  ## Further Work-up:  -- EKG recommended. Labs per ED protocol  -- most recent EKG : NA. Patient not cooperative -- Pertinent labwork reviewed earlier this admission includes: Labs in process   ## Disposition:-- We recommend inpatient psychiatric hospitalization when medically cleared. Patient is under voluntary admission status at this time; please IVC if attempts to leave hospital.  ## Behavioral / Environmental: - No specific recommendations at this time.     ## Safety and Observation Level:  - Based on my clinical evaluation, I estimate the patient to be at low risk of self harm in the current setting. - At this time, we recommend  routine. This decision is based on my review of the chart including patient's history and current presentation, interview of the patient, mental status examination, and consideration of suicide risk including evaluating suicidal ideation, plan, intent, suicidal or self-harm behaviors, risk factors, and protective factors. This judgment is based on our ability to directly address suicide risk, implement suicide prevention strategies, and develop a safety plan while the patient is in the clinical setting. Please contact our team if there is a concern that risk level has changed.  CSSR Risk Category:C-SSRS RISK CATEGORY: Error: Question 6 not populated  Suicide Risk  Assessment: Patient has following modifiable risk factors for suicide: medication noncompliance, current symptoms: anxiety/panic, insomnia, impulsivity, anhedonia, hopelessness, and recent psychiatric hospitalization, which we are addressing by recommending inpatient admission for treatment and  stabilization. Patient has following non-modifiable or demographic risk factors for suicide: psychiatric hospitalization Patient has the following protective factors against suicide: NA  Thank you for this consult request. Recommendations have been communicated to the primary team.  We will recommend Inpatient hospitalization at this time.   Randall Bouquet, NP       History of Present Illness  Relevant Aspects of Hospital ED Course:  Admitted on 12/28/2023 .   Patient Report:  Patient presents via EMS after she was found scrolling on the ground and reporting that she was high on drugs. Upon face-to-face encounter, patient is increasingly anxious, restless, disorganized and reporting hallucinations. Reports that the voices are telling her to kill herself. Reports that she has been using too much crack. Reports that she suffers from Schizophrenia and has not been taking her Abilify  Maintena. Last injection was received the last time she was hospitalized. Patient reports she did not follow up with any outpatient provider. Reports she is homeless with no financial means. Reports she eats only when she steals from stores.  Reports no family around I am from [place].  Reports hx of Bipolar and polysubstance use. Uses crack and Marijuana on daily basis. She denies acute medical condition. Denies chest pain. Denies nausea/vomiting. Denies respiratory distress. She is willing to resume medication regimen. Inpatient recommended.   Psych ROS:  Depression: Reported Anxiety:  current Mania (lifetime and current): current Psychosis: (lifetime and current): current  Collateral information:  Contacted: NA  Review of Systems  Constitutional: Negative.   HENT: Negative.    Eyes: Negative.   Respiratory: Negative.    Cardiovascular: Negative.   Gastrointestinal: Negative.   Genitourinary: Negative.   Musculoskeletal: Negative.   Skin: Negative.   Neurological: Negative.   Endo/Heme/Allergies:  Negative.   Psychiatric/Behavioral:  Positive for hallucinations, substance abuse and suicidal ideas. The patient is nervous/anxious and has insomnia.      Psychiatric and Social History  Psychiatric History:  Information collected from patient/chart  Prev Dx/Sx: Schizophrenia, Bipolar, depression, Substance abuse Current Psych Provider: NA Home Meds (current): She is supposed to be receiving Abilify  Maintena monthly but reports no medication adherence Previous Med Trials: NA Therapy: Not current  Prior Psych Hospitalization: reported  Prior Self Harm: NA Prior Violence: NA  Family Psych History: NA Family Hx suicide: NA  Social History:  Developmental Hx: NA Educational Hx: NA Occupational Hx: NA Legal Hx: NA Living Situation: Homeless Spiritual Hx: NA Access to weapons/lethal means: NA   Substance History Alcohol: not reported  Type of alcohol NA Last Drink NA Number of drinks per day NA History of alcohol withdrawal seizures NA History of DT's NA Tobacco: NA Illicit drugs: smokes crack and Marijuana Prescription drug abuse: NA Rehab hx: NA  Exam Findings  Physical Exam:  Vital Signs:  Temp:  [98 F (36.7 C)] 98 F (36.7 C) (07/14 1008) Pulse Rate:  [74] 74 (07/14 1008) Resp:  [16] 16 (07/14 1008) BP: (159-171)/(82-109) 159/82 (07/14 1016) SpO2:  [98 %-100 %] 98 % (07/14 1012) Weight:  [67.1 kg] 67.1 kg (07/14 1018) Blood pressure (!) 159/82, pulse 74, temperature 98 F (36.7 C), temperature source Oral, resp. rate 16, weight 67.1 kg, last menstrual period 12/08/2023, SpO2 98%. Body mass index is 25.4 kg/m.  Physical Exam Constitutional:  Appearance: She is ill-appearing.  HENT:     Head: Normocephalic and atraumatic.     Right Ear: Tympanic membrane normal.     Left Ear: Tympanic membrane normal.     Nose: Nose normal.  Eyes:     Extraocular Movements: Extraocular movements intact.     Pupils: Pupils are equal, round, and reactive to light.   Musculoskeletal:        General: Normal range of motion.     Cervical back: Normal range of motion.  Neurological:     General: No focal deficit present.     Mental Status Exam: General Appearance: Bizarre and Disheveled  Orientation:  Other:  partial  Memory:  Immediate;   Fair Recent;   Fair Remote;   Poor  Concentration:  Concentration: Poor and Attention Span: Poor  Recall:  Poor  Attention  Poor  Eye Contact:  Fair  Speech:  Pressured  Language:  Fair  Volume:  Increased  Mood: Anxious  Affect:  Labile  Thought Process:  Disorganized and Irrelevant  Thought Content:  Illogical, Obsessions, Paranoid Ideation, and Tangential  Suicidal Thoughts:  No  Homicidal Thoughts:  No  Judgement:  Impaired  Insight:  Lacking  Psychomotor Activity:  Restlessness  Akathisia:  NA  Fund of Knowledge:  Fair      Assets:  Manufacturing systems engineer Desire for Improvement Physical Health  Cognition:  Impaired,  Moderate  ADL's:  UTA  AIMS (if indicated):   NA     Other History   These have been pulled in through the EMR, reviewed, and updated if appropriate.  Family History:  The patient's family history includes CAD (age of onset: 31) in her mother; Hypertension in her father and mother.  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) 07/19/2017   Hypertension    Hypertensive emergency 05/31/2022   Ischemic stroke (HCC) 01/01/2020   subacute right middle  cerebellar peduncle and pons infarction   Left-sided weakness 01/27/2022   Head CT with remote right occipital infarct which is consistent with old left-sided weakness   MDD (major depressive disorder), recurrent severe, 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  Current Outpatient Medications:    ARIPiprazole  (ABILIFY ) 20 MG tablet, Take 1 tablet (20 mg total) by mouth daily for 15 days. (Patient not taking: Reported on 12/28/2023), Disp: 15 tablet, Rfl: 0   ARIPiprazole  ER (ABILIFY  MAINTENA) 400 MG SRER injection, Inject 1.5 mLs (300 mg total) into the muscle every 28 (twenty-eight) days. (Patient not taking: Reported on 12/28/2023), Disp: 2 each, Rfl: 0   ferrous sulfate  325 (65 FE) MG EC tablet, Take  325 mg by mouth daily. (Patient not taking: Reported on 12/13/2023), Disp: , Rfl:    gabapentin  (NEURONTIN ) 100 MG capsule, Take 100 mg by mouth 3 (three) times daily. (Patient not taking: Reported on 12/13/2023), Disp: , Rfl:    losartan  (COZAAR ) 100 MG tablet, Take 100 mg by mouth daily. (Patient not taking: Reported on 12/13/2023), Disp: , Rfl:    metFORMIN  (GLUCOPHAGE ) 500 MG tablet, Take 1 tablet (500 mg total) by mouth 2 (two) times daily with a meal. (Patient not taking: Reported on 12/13/2023), Disp: 60 tablet, Rfl: 0   QUEtiapine  (SEROQUEL ) 25 MG tablet, Take 1 tablet (25 mg total) by mouth at bedtime. (Patient not taking: Reported on 12/28/2023), Disp: 15 tablet, Rfl: 0  Allergies: Allergies  Allergen Reactions   Tomato Anaphylaxis and Other (See Comments)    Pt reports this as an allergy, but has been eating ketchup and pizza on previous  visits w/o any s/s of allergic reaction/anaphylaxis.    Hydrocodone  Itching   Latex Itching and Rash    Randall Bouquet, NP

## 2023-12-28 NOTE — BH Assessment (Signed)
 Patient was deferred to [facility] for a telepsych assessment. The assigned care coordinator will provide updates regarding the scheduling of the assessment. [facility]IS coordinator can be reached at [phone] for further information on the timing of the telepsych evaluation.

## 2023-12-28 NOTE — ED Notes (Signed)
 Pt sleeping VS deferred until later.

## 2023-12-28 NOTE — ED Notes (Signed)
 Psych MD at [facility] verbalizes ordering ativan , recommending inpt.  Moved to ED hall/stretcher.

## 2023-12-28 NOTE — ED Notes (Signed)
 Pt walked back to unit with food all over her shirt and smelling of urine. Pt currently in the shower with assistance from NT

## 2023-12-28 NOTE — ED Provider Notes (Signed)
 [facility] EMERGENCY DEPARTMENT AT [facility] Provider Note   CSN: [id number] Arrival date & time: 12/28/23  1003     Patient presents with: Follow-up   Kaitlyn Gray is a 48 y.o. female.  With a history of polysubstance use, bipolar disorder, type 2 diabetes, and schizoaffective disorder presents to the ED given concern for syncope.  Per EMS, patient was noted to be rolling around on the ground outdoors this morning.  She admits to cocaine use late last night.  Smokes crack cocaine.  Uses marijuana occasionally.  No IV drugs.  Patient reported some chest pain and syncopal episode.  She is unable to recall the circumstances leading up to her transport to the ED.  She does note passive SI.  No plan or attempts.   HPI     Prior to Admission medications   Medication Sig Start Date End Date Taking? Authorizing Provider  ARIPiprazole  (ABILIFY ) 20 MG tablet Take 1 tablet (20 mg total) by mouth daily for 15 days. Patient not taking: Reported on 12/28/2023 12/09/23 12/24/23  Mannie Jerel PARAS, NP  ARIPiprazole  ER (ABILIFY  MAINTENA) 400 MG SRER injection Inject 1.5 mLs (300 mg total) into the muscle every 28 (twenty-eight) days. Patient not taking: Reported on 12/28/2023 11/21/23   Paliy, Alisa, MD  ferrous sulfate  325 (65 FE) MG EC tablet Take 325 mg by mouth daily. Patient not taking: Reported on 12/13/2023    [provider]  gabapentin  (NEURONTIN ) 100 MG capsule Take 100 mg by mouth 3 (three) times daily. Patient not taking: Reported on 12/13/2023    [provider]  losartan  (COZAAR ) 100 MG tablet Take 100 mg by mouth daily. Patient not taking: Reported on 12/13/2023    [provider]  metFORMIN  (GLUCOPHAGE ) 500 MG tablet Take 1 tablet (500 mg total) by mouth 2 (two) times daily with a meal. Patient not taking: Reported on 12/13/2023 10/26/23   Paliy, Alisa, MD  QUEtiapine  (SEROQUEL ) 25 MG tablet Take 1 tablet (25 mg total) by mouth at bedtime. Patient  not taking: Reported on 12/28/2023 12/09/23   Mannie Jerel PARAS, NP    Allergies: Tomato, Hydrocodone , and Latex    Review of Systems  Updated Vital Signs BP (!) 159/82   Pulse 74   Temp 98 F (36.7 C) (Oral)   Resp 16   Wt 67.1 kg   LMP 12/08/2023   SpO2 98%   BMI 25.40 kg/m   Physical Exam Vitals and nursing note reviewed.  HENT:     Head: Normocephalic and atraumatic.     Mouth/Throat:     Comments: Bruxism Eyes:     Pupils: Pupils are equal, round, and reactive to light.  Cardiovascular:     Rate and Rhythm: Normal rate and regular rhythm.  Pulmonary:     Effort: Pulmonary effort is normal.     Breath sounds: Normal breath sounds.  Abdominal:     Palpations: Abdomen is soft.     Tenderness: There is no abdominal tenderness.  Skin:    General: Skin is warm and dry.  Neurological:     Mental Status: She is alert.  Psychiatric:        Mood and Affect: Mood normal.     (all labs ordered are listed, but only abnormal results are displayed) Labs Reviewed  BASIC METABOLIC PANEL WITH GFR - Abnormal; Notable for the following components:      Result Value   Glucose, Bld 109 (*)    BUN 25 (*)  Creatinine, Ser 1.06 (*)    All other components within normal limits  CBC WITH DIFFERENTIAL/PLATELET - Abnormal; Notable for the following components:   RBC 3.51 (*)    Hemoglobin 8.1 (*)    HCT 27.8 (*)    MCV 79.2 (*)    MCH 23.1 (*)    MCHC 29.1 (*)    RDW 17.5 (*)    All other components within normal limits  ACETAMINOPHEN  LEVEL - Abnormal; Notable for the following components:   Acetaminophen  (Tylenol ), Serum <10 (*)    All other components within normal limits  SALICYLATE LEVEL - Abnormal; Notable for the following components:   Salicylate Lvl <7.0 (*)    All other components within normal limits  HCG, SERUM, QUALITATIVE  ETHANOL  RAPID URINE DRUG SCREEN, HOSP PERFORMED  TROPONIN I (HIGH SENSITIVITY)    EKG: None  Radiology: No results  found.   Procedures   Medications Ordered in the ED  QUEtiapine  (SEROQUEL ) tablet 25 mg (has no administration in time range)  metFORMIN  (GLUCOPHAGE ) tablet 500 mg (has no administration in time range)  losartan  (COZAAR ) tablet 100 mg (has no administration in time range)  ferrous sulfate  EC tablet 325 mg (has no administration in time range)  sodium chloride  0.9 % bolus 1,000 mL (0 mLs Intravenous Stopped 12/28/23 1424)  LORazepam  (ATIVAN ) tablet 2 mg (2 mg Oral Given 12/28/23 1435)    Or  LORazepam  (ATIVAN ) injection 2 mg ( Intramuscular See Alternative 12/28/23 1435)    Clinical Course as of 12/28/23 1646  Mon Dec 28, 2023  1233 Laboratory workup notable for anemia.  Similar prior labs with known history of anemia.  No elevation in troponin.  Suspicion for ACS.  Pregnancy test negative.  Mildly elevated creatinine most consistent with mild dehydration.  Ethanol salicylate acetaminophen  all negative.  Awaiting UDS.  Appropriate for TTS evaluation [MP]  1644 TTS evaluation complete.  Discussed with NP [place] who has evaluated patient in ED.  Recommends inpatient psychiatric hospitalization.  Patient will continue boarding in the ED awaiting placement [MP]    Clinical Course User Index [MP] Pamella Ozell LABOR, DO                                 Medical Decision Making 48 year old female with history as above who presents to the ED given concern for confusion.  Reports syncopal episode and chest pain earlier today.  No active chest pain now.  He is requesting to eat and drink here.  Notes passive SI.  No attempts.  Admits to using crack cocaine last night.  Will obtain medical clearance prior to psychiatric evaluation.  Given reported chest pain and syncope will obtain EKG and hypertensive troponin to evaluate for any evidence of ACS dysrhythmia or ischemia.  Once medically cleared will obtain TTS evaluation.  Amount and/or Complexity of Data Reviewed Labs: ordered.  Risk OTC  drugs. Prescription drug management.        Final diagnoses:  Chest pain, unspecified type  Cocaine use  Suicidal ideation    ED Discharge Orders     None          Pamella Ozell LABOR, DO 12/28/23 1647

## 2023-12-28 NOTE — ED Notes (Signed)
 Psychiatry into see in FT 6

## 2023-12-28 NOTE — ED Notes (Signed)
 Pt given prescribed meds and dinner tray. Calm and cooperative at this time

## 2023-12-29 LAB — RESP PANEL BY RT-PCR (RSV, FLU A&B, COVID)  RVPGX2
Influenza A by PCR: NEGATIVE
Influenza B by PCR: NEGATIVE
Resp Syncytial Virus by PCR: NEGATIVE
SARS Coronavirus 2 by RT PCR: NEGATIVE

## 2023-12-29 MED ORDER — AMLODIPINE BESYLATE 5 MG PO TABS
5.0000 mg | ORAL_TABLET | Freq: Every day | ORAL | Status: DC
  Administered 2023-12-29: 5 mg via ORAL
  Filled 2023-12-29: qty 1

## 2023-12-29 MED ORDER — GABAPENTIN 100 MG PO CAPS
100.0000 mg | ORAL_CAPSULE | Freq: Three times a day (TID) | ORAL | Status: DC
  Administered 2023-12-29 (×2): 100 mg via ORAL
  Filled 2023-12-29 (×2): qty 1

## 2023-12-29 MED ORDER — ARIPIPRAZOLE 5 MG PO TABS
15.0000 mg | ORAL_TABLET | Freq: Every day | ORAL | Status: DC
  Administered 2023-12-29: 15 mg via ORAL
  Filled 2023-12-29: qty 1

## 2023-12-29 NOTE — ED Notes (Signed)
 Patient woke up again and pee in the floor. Patient unable to follow commands and verbally abusive to staff.

## 2023-12-29 NOTE — Progress Notes (Signed)
 LCSW Progress Note  [id number]   Kaitlyn Gray  12/29/2023  12:02 PM  Description:   Inpatient Psychiatric Referral  Patient was recommended inpatient per Starlyn Patron NP ). There are no available beds at [facility], per [facility] Cherylynn Ernst RN.Patient was referred to the following out of network facilities:   [facility] Provider Address Phone Fax  [facility]  [address] [place] [address] [phone]  [facility] Patient Placement  [facility], [place] [phone]  [facility]  [address] [place] [address] [phone]  [facility]  [address] [place] [address] [phone]  [facility]  [address] [place], [place] [address] [phone]  [facility]  [address] [place] [address] [phone]  [facility]  [address], [place] [address] [phone]  [facility]  [address] [place] [address] [phone]  [facility]  [address] [place] [address] [phone]  [facility]  [place] [phone]  [facility]  [address] [place] [address] [phone]  [facility]  [address], [place] [address] [phone]      Situation ongoing, CSW to continue following and update chart as more information becomes available.      [place] Luan Maberry MSW, LCSW  12/29/2023 12:02 PM

## 2023-12-29 NOTE — ED Notes (Signed)
 Patient woke up and urinate in the floor. Patient refused to follow commands.

## 2023-12-29 NOTE — ED Notes (Signed)
Safe transport called 

## 2023-12-29 NOTE — ED Notes (Signed)
 Giving report to nurse @ [facility]. Nurse requested to call back regarding pt.  BP being too high.

## 2023-12-29 NOTE — ED Notes (Signed)
 Patient refused EKG.

## 2023-12-29 NOTE — ED Provider Notes (Signed)
 Emergency Medicine Observation Re-evaluation Note  Kaitlyn Gray is a 48 y.o. female, seen on rounds today.  Pt initially presented to the ED for complaints of No chief complaint on file. Currently, the patient is sleeping.  Physical Exam  BP (!) 172/104   Pulse 66   Temp 97.6 F (36.4 C) (Axillary)   Resp 16   Wt 67.1 kg   LMP 12/08/2023   SpO2 100%   BMI 25.40 kg/m  Physical Exam General: Sleeping Cardiac: Extremities well-perfused Lungs: Breathing is unlabored Psych: Deferred  ED Course / MDM  EKG:   I have reviewed the labs performed to date as well as medications administered while in observation.  Recent changes in the last 24 hours include presentation to the emergency department yesterday for concern of syncopal episode in the setting of cocaine use.  She did note suicidal ideation.  She has been medically cleared.  She was evaluated by TTS who recommends inpatient psychiatric hospitalization.  Plan  Current plan is for inpatient psychiatric admission.    Melvenia Motto, MD 12/29/23 [phone]

## 2023-12-29 NOTE — Progress Notes (Signed)
 Pt has been accepted to [facility] on 12/29/2023 Bed assignment: MAIN CAMPUS  Pt meets inpatient criteria per: Cathaleen Adam, PMHNP   Attending Physician will be: Prentice Balloon MD  Report can be called un:lwpu:[place] unit: [phone]  Pt can arrive after NEG COVID   Care Team Notified: Cathaleen Adam, PMHNP   [place] Martha Ellerby LCSW-A   12/29/2023 12:49 PM

## 2023-12-29 NOTE — ED Notes (Signed)
 Spoke with Elenor Gentry @ [facility] regarding pt.

## 2023-12-29 NOTE — ED Notes (Signed)
 Safe transport arrived to transfer pt.

## 2024-01-03 ENCOUNTER — Emergency Department (HOSPITAL_COMMUNITY)
Admission: EM | Admit: 2024-01-03 | Discharge: 2024-01-03 | Disposition: A | Discharge status: Home / Self Care | Payer: MEDICAID | Attending: Emergency Medicine | Admitting: Emergency Medicine

## 2024-01-03 ENCOUNTER — Emergency Department (HOSPITAL_COMMUNITY): Payer: MEDICAID

## 2024-01-03 ENCOUNTER — Other Ambulatory Visit: Payer: Self-pay

## 2024-01-03 ENCOUNTER — Encounter (HOSPITAL_COMMUNITY): Payer: Self-pay | Admitting: Emergency Medicine

## 2024-01-03 DIAGNOSIS — I10 Essential (primary) hypertension: Secondary | ICD-10-CM | POA: Insufficient documentation

## 2024-01-03 DIAGNOSIS — S80812A Abrasion, left lower leg, initial encounter: Secondary | ICD-10-CM | POA: Insufficient documentation

## 2024-01-03 DIAGNOSIS — Z79899 Other long term (current) drug therapy: Secondary | ICD-10-CM | POA: Insufficient documentation

## 2024-01-03 DIAGNOSIS — Z7984 Long term (current) use of oral hypoglycemic drugs: Secondary | ICD-10-CM | POA: Diagnosis not present

## 2024-01-03 DIAGNOSIS — Z91040 Latex allergy status: Secondary | ICD-10-CM | POA: Insufficient documentation

## 2024-01-03 DIAGNOSIS — W19XXXA Unspecified fall, initial encounter: Secondary | ICD-10-CM | POA: Insufficient documentation

## 2024-01-03 DIAGNOSIS — M79605 Pain in left leg: Secondary | ICD-10-CM | POA: Diagnosis present

## 2024-01-03 DIAGNOSIS — E119 Type 2 diabetes mellitus without complications: Secondary | ICD-10-CM | POA: Insufficient documentation

## 2024-01-03 DIAGNOSIS — M549 Dorsalgia, unspecified: Secondary | ICD-10-CM | POA: Diagnosis not present

## 2024-01-03 LAB — CBC WITH DIFFERENTIAL/PLATELET
Abs Immature Granulocytes: 0.01 K/uL (ref 0.00–0.07)
Basophils Absolute: 0.1 K/uL (ref 0.0–0.1)
Basophils Relative: 1 %
Eosinophils Absolute: 0.1 K/uL (ref 0.0–0.5)
Eosinophils Relative: 1 %
HCT: 27.4 % — ABNORMAL LOW (ref 36.0–46.0)
Hemoglobin: 8.1 g/dL — ABNORMAL LOW (ref 12.0–15.0)
Immature Granulocytes: 0 %
Lymphocytes Relative: 27 %
Lymphs Abs: 1.7 K/uL (ref 0.7–4.0)
MCH: 23.1 pg — ABNORMAL LOW (ref 26.0–34.0)
MCHC: 29.6 g/dL — ABNORMAL LOW (ref 30.0–36.0)
MCV: 78.3 fL — ABNORMAL LOW (ref 80.0–100.0)
Monocytes Absolute: 0.4 K/uL (ref 0.1–1.0)
Monocytes Relative: 7 %
Neutro Abs: 3.9 K/uL (ref 1.7–7.7)
Neutrophils Relative %: 64 %
Platelets: 246 K/uL (ref 150–400)
RBC: 3.5 MIL/uL — ABNORMAL LOW (ref 3.87–5.11)
RDW: 19 % — ABNORMAL HIGH (ref 11.5–15.5)
WBC: 6.2 K/uL (ref 4.0–10.5)
nRBC: 0 % (ref 0.0–0.2)

## 2024-01-03 LAB — COMPREHENSIVE METABOLIC PANEL WITH GFR
ALT: 31 U/L (ref 0–44)
AST: 30 U/L (ref 15–41)
Albumin: 3.2 g/dL — ABNORMAL LOW (ref 3.5–5.0)
Alkaline Phosphatase: 52 U/L (ref 38–126)
Anion gap: 8 (ref 5–15)
BUN: 22 mg/dL — ABNORMAL HIGH (ref 6–20)
CO2: 23 mmol/L (ref 22–32)
Calcium: 8.4 mg/dL — ABNORMAL LOW (ref 8.9–10.3)
Chloride: 108 mmol/L (ref 98–111)
Creatinine, Ser: 1.02 mg/dL — ABNORMAL HIGH (ref 0.44–1.00)
GFR, Estimated: >60 mL/min (ref >60)
Glucose, Bld: 134 mg/dL — ABNORMAL HIGH (ref 70–99)
Potassium: 3.7 mmol/L (ref 3.5–5.1)
Sodium: 139 mmol/L (ref 135–145)
Total Bilirubin: 0.4 mg/dL (ref 0.0–1.2)
Total Protein: 6.6 g/dL (ref 6.5–8.1)

## 2024-01-03 LAB — HCG, QUANTITATIVE, PREGNANCY: hCG, Beta Chain, Quant, S: 1 m[IU]/mL (ref <5)

## 2024-01-03 LAB — ETHANOL: Alcohol, Ethyl (B): <15 mg/dL (ref <15)

## 2024-01-03 NOTE — ED Notes (Signed)
 Patient transported to CT

## 2024-01-03 NOTE — ED Provider Notes (Signed)
 [place] EMERGENCY DEPARTMENT AT [facility] Provider Note   CSN: [id number] Arrival date & time: 01/03/24  9683     Patient presents with: Kaitlyn Gray Kaitlyn Gray is a 48 y.o. female.   48 yo female brought in by EMS after found down in the street by by-stander.  Patient states that she was trying to go to a friend's house without using her walker, became tired and states that she passed out and laid down on the street.  She reports pain in her legs and back.  No other injuries, complaints, or concerns.       Prior to Admission medications   Medication Sig Start Date End Date Taking? Authorizing Provider  ARIPiprazole  (ABILIFY ) 20 MG tablet Take 1 tablet (20 mg total) by mouth daily for 15 days. Patient not taking: Reported on 12/28/2023 12/09/23 12/24/23  Mannie Jerel PARAS, NP  ARIPiprazole  ER (ABILIFY  MAINTENA) 400 MG SRER injection Inject 1.5 mLs (300 mg total) into the muscle every 28 (twenty-eight) days. Patient not taking: Reported on 12/28/2023 11/21/23   Paliy, Alisa, MD  ferrous sulfate  325 (65 FE) MG EC tablet Take 325 mg by mouth daily. Patient not taking: Reported on 12/13/2023    [provider]  gabapentin  (NEURONTIN ) 100 MG capsule Take 100 mg by mouth 3 (three) times daily. Patient not taking: Reported on 12/13/2023    [provider]  losartan  (COZAAR ) 100 MG tablet Take 100 mg by mouth daily. Patient not taking: Reported on 12/13/2023    [provider]  metFORMIN  (GLUCOPHAGE ) 500 MG tablet Take 1 tablet (500 mg total) by mouth 2 (two) times daily with a meal. Patient not taking: Reported on 12/13/2023 10/26/23   Paliy, Alisa, MD  QUEtiapine  (SEROQUEL ) 25 MG tablet Take 1 tablet (25 mg total) by mouth at bedtime. Patient not taking: Reported on 12/28/2023 12/09/23   Mannie Jerel PARAS, NP    Allergies: Tomato, Hydrocodone , and Latex    Review of Systems Negative except as per HPI Updated Vital Signs BP (!) 148/91   Pulse 75    Temp 97.9 F (36.6 C) (Oral)   Resp 17   Ht 5' 4 (1.626 m)   Wt 65.8 kg   LMP 12/08/2023 (Exact Date)   SpO2 100%   BMI 24.89 kg/m   Physical Exam Vitals and nursing note reviewed.  Constitutional:      General: She is not in acute distress.    Appearance: She is well-developed. She is not diaphoretic.  HENT:     Head: Normocephalic and atraumatic.  Cardiovascular:     Rate and Rhythm: Normal rate and regular rhythm.     Heart sounds: Normal heart sounds.  Pulmonary:     Effort: Pulmonary effort is normal.     Breath sounds: Normal breath sounds.  Musculoskeletal:        General: No swelling or tenderness.     Right lower leg: No edema.     Left lower leg: No edema.     Comments: Minor abrasion to anterior left lower leg does not appear acute  Skin:    General: Skin is warm and dry.  Neurological:     Mental Status: She is alert and oriented to person, place, and time.  Psychiatric:        Behavior: Behavior normal.     (all labs ordered are listed, but only abnormal results are displayed) Labs Reviewed  CBC WITH DIFFERENTIAL/PLATELET - Abnormal; Notable for the  following components:      Result Value   RBC 3.50 (*)    Hemoglobin 8.1 (*)    HCT 27.4 (*)    MCV 78.3 (*)    MCH 23.1 (*)    MCHC 29.6 (*)    RDW 19.0 (*)    All other components within normal limits  COMPREHENSIVE METABOLIC PANEL WITH GFR - Abnormal; Notable for the following components:   Glucose, Bld 134 (*)    BUN 22 (*)    Creatinine, Ser 1.02 (*)    Calcium  8.4 (*)    Albumin 3.2 (*)    All other components within normal limits  ETHANOL  HCG, QUANTITATIVE, PREGNANCY  URINALYSIS, ROUTINE W REFLEX MICROSCOPIC  RAPID URINE DRUG SCREEN, HOSP PERFORMED    EKG: EKG Interpretation Date/Time:  Sunday January 03 2024 03:32:24 EDT Ventricular Rate:  72 PR Interval:  139 QRS Duration:  100 QT Interval:  419 QTC Calculation: 459 R Axis:   12  Text Interpretation: Sinus rhythm Minimal ST  elevation, anterior leads Confirmed by Jerral Meth [phone]) on 01/03/2024 5:32:11 AM  Radiology: CT Cervical Spine Wo Contrast Result Date: 01/03/2024 CLINICAL DATA:  48 year old female status post fall. Pain. History of stroke. EXAM: CT CERVICAL SPINE WITHOUT CONTRAST TECHNIQUE: Multidetector CT imaging of the cervical spine was performed without intravenous contrast. Multiplanar CT image reconstructions were also generated. RADIATION DOSE REDUCTION: This exam was performed according to the departmental dose-optimization program which includes automated exposure control, adjustment of the mA and/or kV according to patient size and/or use of iterative reconstruction technique. COMPARISON:  Head CT today.  Cervical spine CT 03/27/2023. FINDINGS: Alignment: Chronic straightening of cervical lordosis. Cervicothoracic junction alignment is within normal limits. Bilateral posterior element alignment is within normal limits. Skull base and vertebrae: Bone mineralization is within normal limits. Visualized skull base is intact. No atlanto-occipital dissociation. C1 and C2 appear intact and aligned. No acute osseous abnormality identified. Soft tissues and spinal canal: No prevertebral fluid or swelling. No visible canal hematoma. Evidence of retropharyngeal course of the carotids, normal variant and confirmed on neck CTA last month. Disc levels: Bulky chronic anterior endplate spurring throughout the mid and lower cervical spine, stable from last year. No interbody ankylosis. Upper chest: Visible upper thoracic levels appear intact. Negative visible noncontrast thoracic inlet. IMPRESSION: No acute traumatic injury identified in the cervical spine. Electronically Signed   By: VEAR Hurst M.D.   On: 01/03/2024 05:10   CT Head Wo Contrast Result Date: 01/03/2024 CLINICAL DATA:  48 year old female status post fall. Pain. History of stroke. EXAM: CT HEAD WITHOUT CONTRAST TECHNIQUE: Contiguous axial images were obtained  from the base of the skull through the vertex without intravenous contrast. RADIATION DOSE REDUCTION: This exam was performed according to the departmental dose-optimization program which includes automated exposure control, adjustment of the mA and/or kV according to patient size and/or use of iterative reconstruction technique. COMPARISON:  Brain MRI and head CT 12/13/2023 FINDINGS: Brain: Chronic left superior perirolandic cortical infarct with encephalomalacia is stable. Small area of chronic encephalomalacia in the right occipital pole also stable. Subtle chronic right cerebellar infarcts also stable. Stable brain volume. No midline shift, ventriculomegaly, mass effect, evidence of mass lesion, intracranial hemorrhage or evidence of cortically based acute infarction. Stable gray-white differentiation. Vascular: No suspicious intracranial vascular hyperdensity. Skull: Appears stable and intact. Sinuses/Orbits: Visualized paranasal sinuses and mastoids are stable and well aerated. Other: Disconjugate gaze, but otherwise no acute orbit or scalp soft tissue finding. IMPRESSION:  1. No acute intracranial abnormality or acute traumatic injury identified. 2. Stable non contrast CT appearance of chronic infarcts in the right cerebellar, right PCA, left posterior MCA territories. Electronically Signed   By: VEAR Hurst M.D.   On: 01/03/2024 05:08     Procedures   Medications Ordered in the ED - No data to display                                  Medical Decision Making Amount and/or Complexity of Data Reviewed Labs: ordered. Radiology: ordered.   This patient presents to the ED for concern of fall, possible syncope, this involves an extensive number of treatment options, and is a complaint that carries with it a high risk of complications and morbidity.  The differential diagnosis includes but not limited to mechanical fall, syncope, electrolyte, metabolic, intracranial injury, stroke   Co morbidities /  Chronic conditions that complicate the patient evaluation  Cannabis abuse, depression, bipolar disorder, polysubstance abuse, diabetes, hypertension, CVA, cervical fracture, iron  deficiency anemia, toxic encephalopathy, malingering, MDD, hallucinations   Additional history obtained:  Additional history obtained from EMR History is provided by EMS External records from outside source obtained and reviewed including prior labs on file   Lab Tests:  I Ordered, and personally interpreted labs.  The pertinent results include: CBC with hemoglobin 8.1, similar to prior on file, history of anemia.  CMP without significant findings.  hCG negative.  Alcohol negative.   Imaging Studies ordered:  I ordered imaging studies including CT head, c-spine  I independently visualized and interpreted imaging which showed no acute process I agree with the radiologist interpretation   Cardiac Monitoring: / EKG:  The patient was maintained on a cardiac monitor.  I personally viewed and interpreted the cardiac monitored which showed an underlying rhythm of: sinus rhythm, rate 72   Problem List / ED Course / Critical interventions / Medication management  48 year old female presents via EMS after fall versus syncope.  Patient is supposed to be using a walker however was walking to her friend's house without a walker when she fell, unsure if she passed out.  Her workup today is largely reassuring, no acute process identified.  Recommend follow-up with primary care provider, use walker, return to ER as needed. I have reviewed the patients home medicines and have made adjustments as needed   Consultations Obtained:  I requested consultation with the ER attending, Dr. Jerral,  and discussed lab and imaging findings as well as pertinent plan - they recommend: agrees with plan of care   Social Determinants of Health:  Has PCP   Test / Admission - Considered:  Stable for dc      Final  diagnoses:  Fall, initial encounter    ED Discharge Orders     None          Beverley Leita LABOR, PA-C 01/03/24 0547    Jerral Meth, MD 01/03/24 [phone]

## 2024-01-03 NOTE — ED Notes (Signed)
 Nt called CCMD@3 :26am

## 2024-01-03 NOTE — ED Notes (Signed)
 Pt provided a bus pass, sandwich bag, sprite, and escorted to the lobby via wheelchair.  Reviewed D/C information with the patient, pt verbalized understanding. No additional concerns at this time.

## 2024-01-03 NOTE — ED Triage Notes (Signed)
 Pt presents to the ED via [facility] with complaints of back pain following a fall tonight. Pt states she uses a walker at baseline but did not have it tonight. Pt was placed in C-Collar by EMS but removed it PTA due to discomfort. EDP discussed the risks of removing it prior to scans in which the pt still refused to maintain collar in place. A&Ox4 at this time. Denies dizziness, LOC, CP or SOB.

## 2024-01-03 NOTE — Discharge Instructions (Signed)
Follow up with your primary care provider for recheck  ?

## 2024-01-04 ENCOUNTER — Telehealth: Payer: Self-pay

## 2024-01-04 NOTE — Transitions of Care (Post Inpatient/ED Visit) (Unsigned)
   01/04/2024  Name: Kaitlyn Gray MRN: [id number] DOB: June 01, 1976  Today's TOC FU Call Status: Today's TOC FU Call Status:: Unsuccessful Call (1st Attempt) Unsuccessful Call (1st Attempt) Date: 01/04/24  Attempted to reach the patient regarding the most recent Inpatient/ED visit.  Follow Up Plan: Additional outreach attempts will be made to reach the patient to complete the Transitions of Care (Post Inpatient/ED visit) call.   Signature Julian Lemmings, LPN [facility] Nurse Health Advisor Direct Dial [phone]

## 2024-01-05 NOTE — Transitions of Care (Post Inpatient/ED Visit) (Unsigned)
   01/05/2024  Name: Kaitlyn Gray MRN: [id number] DOB: [birth year]  Today's TOC FU Call Status: Today's TOC FU Call Status:: Unsuccessful Call (2nd Attempt) Unsuccessful Call (1st Attempt) Date: 01/04/24 Unsuccessful Call (2nd Attempt) Date: 01/05/24  Attempted to reach the patient regarding the most recent Inpatient/ED visit.  Follow Up Plan: Additional outreach attempts will be made to reach the patient to complete the Transitions of Care (Post Inpatient/ED visit) call.   Signature Julian Lemmings, LPN [facility] Nurse Health Advisor Direct Dial [phone]

## 2024-01-06 NOTE — Transitions of Care (Post Inpatient/ED Visit) (Signed)
   01/06/2024  Name: Kaitlyn Gray MRN: [id number] DOB: 30-Jun-1975  Today's TOC FU Call Status: Today's TOC FU Call Status:: Unsuccessful Call (3rd Attempt) Unsuccessful Call (1st Attempt) Date: 01/04/24 Unsuccessful Call (2nd Attempt) Date: 01/05/24 Unsuccessful Call (3rd Attempt) Date: 01/06/24  Attempted to reach the patient regarding the most recent Inpatient/ED visit.  Follow Up Plan: No further outreach attempts will be made at this time. We have been unable to contact the patient.  Signature Julian Lemmings, LPN [facility] Nurse Health Advisor Direct Dial [phone]

## 2024-01-17 ENCOUNTER — Emergency Department (HOSPITAL_COMMUNITY)
Admission: EM | Admit: 2024-01-17 | Discharge: 2024-01-18 | Disposition: A | Discharge status: Home / Self Care | Payer: MEDICAID

## 2024-01-17 ENCOUNTER — Emergency Department (HOSPITAL_COMMUNITY): Payer: MEDICAID

## 2024-01-17 ENCOUNTER — Other Ambulatory Visit: Payer: Self-pay

## 2024-01-17 ENCOUNTER — Encounter (HOSPITAL_COMMUNITY): Payer: Self-pay | Admitting: Emergency Medicine

## 2024-01-17 DIAGNOSIS — F1911 Other psychoactive substance abuse, in remission: Secondary | ICD-10-CM

## 2024-01-17 DIAGNOSIS — Z8673 Personal history of transient ischemic attack (TIA), and cerebral infarction without residual deficits: Secondary | ICD-10-CM | POA: Insufficient documentation

## 2024-01-17 DIAGNOSIS — I10 Essential (primary) hypertension: Secondary | ICD-10-CM | POA: Diagnosis not present

## 2024-01-17 DIAGNOSIS — Z79899 Other long term (current) drug therapy: Secondary | ICD-10-CM | POA: Diagnosis not present

## 2024-01-17 DIAGNOSIS — E876 Hypokalemia: Secondary | ICD-10-CM | POA: Diagnosis not present

## 2024-01-17 DIAGNOSIS — Z91040 Latex allergy status: Secondary | ICD-10-CM | POA: Diagnosis not present

## 2024-01-17 DIAGNOSIS — R4182 Altered mental status, unspecified: Secondary | ICD-10-CM | POA: Insufficient documentation

## 2024-01-17 DIAGNOSIS — Z7984 Long term (current) use of oral hypoglycemic drugs: Secondary | ICD-10-CM | POA: Diagnosis not present

## 2024-01-17 DIAGNOSIS — Z87898 Personal history of other specified conditions: Secondary | ICD-10-CM | POA: Diagnosis not present

## 2024-01-17 DIAGNOSIS — E119 Type 2 diabetes mellitus without complications: Secondary | ICD-10-CM | POA: Diagnosis not present

## 2024-01-17 LAB — CBC WITH DIFFERENTIAL/PLATELET
Abs Immature Granulocytes: 0.02 K/uL (ref 0.00–0.07)
Basophils Absolute: 0 K/uL (ref 0.0–0.1)
Basophils Relative: 1 %
Eosinophils Absolute: 0.1 K/uL (ref 0.0–0.5)
Eosinophils Relative: 2 %
HCT: 27.9 % — ABNORMAL LOW (ref 36.0–46.0)
Hemoglobin: 8.1 g/dL — ABNORMAL LOW (ref 12.0–15.0)
Immature Granulocytes: 0 %
Lymphocytes Relative: 17 %
Lymphs Abs: 1.3 K/uL (ref 0.7–4.0)
MCH: 22.6 pg — ABNORMAL LOW (ref 26.0–34.0)
MCHC: 29 g/dL — ABNORMAL LOW (ref 30.0–36.0)
MCV: 77.9 fL — ABNORMAL LOW (ref 80.0–100.0)
Monocytes Absolute: 0.5 K/uL (ref 0.1–1.0)
Monocytes Relative: 6 %
Neutro Abs: 5.7 K/uL (ref 1.7–7.7)
Neutrophils Relative %: 74 %
Platelets: 442 K/uL — ABNORMAL HIGH (ref 150–400)
RBC: 3.58 MIL/uL — ABNORMAL LOW (ref 3.87–5.11)
RDW: 17.8 % — ABNORMAL HIGH (ref 11.5–15.5)
WBC: 7.7 K/uL (ref 4.0–10.5)
nRBC: 0 % (ref 0.0–0.2)

## 2024-01-17 LAB — COMPREHENSIVE METABOLIC PANEL WITH GFR
ALT: 15 U/L (ref 0–44)
AST: 15 U/L (ref 15–41)
Albumin: 3.5 g/dL (ref 3.5–5.0)
Alkaline Phosphatase: 59 U/L (ref 38–126)
Anion gap: 10 (ref 5–15)
BUN: 11 mg/dL (ref 6–20)
CO2: 28 mmol/L (ref 22–32)
Calcium: 9 mg/dL (ref 8.9–10.3)
Chloride: 104 mmol/L (ref 98–111)
Creatinine, Ser: 1.15 mg/dL — ABNORMAL HIGH (ref 0.44–1.00)
GFR, Estimated: 58 mL/min — ABNORMAL LOW (ref >60)
Glucose, Bld: 132 mg/dL — ABNORMAL HIGH (ref 70–99)
Potassium: 2.6 mmol/L — CL (ref 3.5–5.1)
Sodium: 142 mmol/L (ref 135–145)
Total Bilirubin: 0.7 mg/dL (ref 0.0–1.2)
Total Protein: 6.9 g/dL (ref 6.5–8.1)

## 2024-01-17 LAB — CBG MONITORING, ED: Glucose-Capillary: 144 mg/dL — ABNORMAL HIGH (ref 70–99)

## 2024-01-17 LAB — ETHANOL: Alcohol, Ethyl (B): <15 mg/dL (ref <15)

## 2024-01-17 MED ORDER — POTASSIUM CHLORIDE 10 MEQ/100ML IV SOLN
10.0000 meq | INTRAVENOUS | Status: AC
  Administered 2024-01-18 (×2): 10 meq via INTRAVENOUS
  Filled 2024-01-17: qty 100

## 2024-01-17 NOTE — ED Provider Notes (Signed)
 [place] EMERGENCY DEPARTMENT AT [facility] Provider Note   CSN: [id number] Arrival date & time: 01/17/24  1949     Patient presents with: Altered Mental Status   Kaitlyn Gray is a 48 y.o. female.   Patient to ED by EMS after being found slumped over in the road, witnessed walking by bystanders, then just laid down on the ground. History of CVA, history of polysubstance abuse, HTN, T2DM.   The history is provided by the EMS personnel. No language interpreter was used.  Altered Mental Status      Prior to Admission medications   Medication Sig Start Date End Date Taking? Authorizing Provider  ARIPiprazole  (ABILIFY ) 20 MG tablet Take 1 tablet (20 mg total) by mouth daily for 15 days. Patient not taking: Reported on 12/28/2023 12/09/23 12/24/23  Mannie Jerel PARAS, NP  ARIPiprazole  ER (ABILIFY  MAINTENA) 400 MG SRER injection Inject 1.5 mLs (300 mg total) into the muscle every 28 (twenty-eight) days. Patient not taking: Reported on 12/28/2023 11/21/23   Paliy, Alisa, MD  ferrous sulfate  325 (65 FE) MG EC tablet Take 325 mg by mouth daily. Patient not taking: Reported on 12/13/2023    [provider]  gabapentin  (NEURONTIN ) 100 MG capsule Take 100 mg by mouth 3 (three) times daily. Patient not taking: Reported on 12/13/2023    [provider]  losartan  (COZAAR ) 100 MG tablet Take 100 mg by mouth daily. Patient not taking: Reported on 12/13/2023    [provider]  metFORMIN  (GLUCOPHAGE ) 500 MG tablet Take 1 tablet (500 mg total) by mouth 2 (two) times daily with a meal. Patient not taking: Reported on 12/13/2023 10/26/23   Paliy, Alisa, MD  QUEtiapine  (SEROQUEL ) 25 MG tablet Take 1 tablet (25 mg total) by mouth at bedtime. Patient not taking: Reported on 12/28/2023 12/09/23   Mannie Jerel PARAS, NP    Allergies: Tomato, Hydrocodone , and Latex    Review of Systems  Updated Vital Signs BP (!) 161/97   Pulse (!) 47   Temp 98.9 F (37.2 C) (Oral)    Resp 16   Wt 66 kg   LMP 12/08/2023 (Exact Date)   SpO2 100%   BMI 24.98 kg/m   Physical Exam Vitals and nursing note reviewed.  Neurological:     GCS: GCS eye subscore is 2. GCS verbal subscore is 3. GCS motor subscore is 4.     Comments: Arrives to ED responsive to pain, mumbling.      (all labs ordered are listed, but only abnormal results are displayed) Labs Reviewed  CBC WITH DIFFERENTIAL/PLATELET - Abnormal; Notable for the following components:      Result Value   RBC 3.58 (*)    Hemoglobin 8.1 (*)    HCT 27.9 (*)    MCV 77.9 (*)    MCH 22.6 (*)    MCHC 29.0 (*)    RDW 17.8 (*)    Platelets 442 (*)    All other components within normal limits  COMPREHENSIVE METABOLIC PANEL WITH GFR - Abnormal; Notable for the following components:   Potassium 2.6 (*)    Glucose, Bld 132 (*)    Creatinine, Ser 1.15 (*)    GFR, Estimated 58 (*)    All other components within normal limits  CBG MONITORING, ED - Abnormal; Notable for the following components:   Glucose-Capillary 144 (*)    All other components within normal limits  ETHANOL  RAPID URINE DRUG SCREEN, HOSP PERFORMED  URINALYSIS, ROUTINE W  REFLEX MICROSCOPIC    EKG: EKG Interpretation Date/Time:  Sunday January 17 2024 19:56:32 EDT Ventricular Rate:  63 PR Interval:  151 QRS Duration:  85 QT Interval:  498 QTC Calculation: 510 R Axis:   53  Text Interpretation: Sinus rhythm Probable left atrial enlargement Borderline prolonged QT interval Confirmed by Young, Travis ([id number]) on 01/17/2024 11:26:36 PM  Radiology: CT Head Wo Contrast Result Date: 01/17/2024 EXAM: CT HEAD WITHOUT 01/17/2024 09:24:06 PM TECHNIQUE: CT of the head was performed without the administration of intravenous contrast. Automated exposure control, iterative reconstruction, and/or weight based adjustment of the mA/kV was utilized to reduce the radiation dose to as low as reasonably achievable. COMPARISON: 01/03/2024 CLINICAL HISTORY: Mental status  change, unknown cause. FINDINGS: BRAIN AND VENTRICLES: No acute intracranial hemorrhage. No mass effect or midline shift. No extra-axial fluid collection. Gray-white differentiation is maintained. No hydrocephalus. Chronic left superior encephalomalacia. Chronic encephalomalacia in the right occipital lobe. No evidence of acute infarct. Chronic microvascular ischemia and generalized atrophy. ORBITS: No acute abnormality. SINUSES AND MASTOIDS: No acute abnormality. SOFT TISSUES AND SKULL: No acute skull fracture. No acute soft tissue abnormality. IMPRESSION: 1. No acute intracranial abnormality. Electronically signed by: Tyler Stutzman MD 01/17/2024 09:34 PM EDT RP Workstation: [id number]     Procedures   Medications Ordered in the ED  potassium chloride 10 mEq in 100 mL IVPB (has no administration in time range)                                    Medical Decision Making This patient presents to the ED for concern of altered mental status, this involves an extensive number of treatment options, and is a complaint that carries with it a high risk of complications and morbidity.  The differential diagnosis includes intoxication, intracranial injury, CVA, SDH/SAH, ACS   Co morbidities that complicate the patient evaluation  Polysubstance abuse, ischemic CVA, Bipolar DO, HTN, T2DM, anemia, MDD,    Additional history obtained:  Additional history and/or information obtained from chart review, notable for  Multiple recent visits to ED: polysubstance abuse, hallucinations, SI - no evidence recent stroke on multiple work ups.    Lab Tests:  I Ordered, and personally interpreted labs.  The pertinent results include:   Results for orders placed or performed during the hospital encounter of 01/17/24 -CBG monitoring, ED:  Collection Time: 01/17/24  7:56 PM      Result                      Value             Ref Range          Glucose-Capillary           144 (H)           70  - 99 mg/dL       Comment 1                   Notify RN                            Comment 2  Document in Chart -CBC with Differential:  Collection Time: 01/17/24  8:06 PM      Result                      Value             Ref Range          WBC                         7.7               4.0 - 10.5 K*      RBC                         3.58 (L)          3.87 - 5.11 *      Hemoglobin                  8.1 (L)           12.0 - 15.0 *      HCT                         27.9 (L)          36.0 - 46.0 %      MCV                         77.9 (L)          80.0 - 100.0*      MCH                         22.6 (L)          26.0 - 34.0 *      MCHC                        29.0 (L)          30.0 - 36.0 *      RDW                         17.8 (H)          11.5 - 15.5 %      Platelets                   442 (H)           150 - 400 K/*      nRBC                        0.0               0.0 - 0.2 %        Neutrophils Relative %      74                %                  Neutro Abs                  5.7               1.7 - 7.7 K/*      Lymphocytes Relative  17                %                  Lymphs Abs                  1.3               0.7 - 4.0 K/*      Monocytes Relative          6                 %                  Monocytes Absolute          0.5               0.1 - 1.0 K/*      Eosinophils Relative        2                 %                  Eosinophils Absolute        0.1               0.0 - 0.5 K/*      Basophils Relative          1                 %                  Basophils Absolute          0.0               0.0 - 0.1 K/*      Immature Granulocytes       0                 %                  Abs Immature Granulocy*     0.02              0.00 - 0.07 * -Ethanol:  Collection Time: 01/17/24  8:06 PM      Result                      Value             Ref Range          Alcohol, Ethyl (B)          <15               <15 mg/dL     -Comprehensive metabolic panel:   Collection Time: 01/17/24  8:06 PM      Result                      Value             Ref Range          Sodium                      142               135 - 145 mm*      Potassium  2.6 (LL)          3.5 - 5.1 mm*      Chloride                    104               98 - 111 mmo*      CO2                         28                22 - 32 mmol*      Glucose, Bld                132 (H)           70 - 99 mg/dL      BUN                         11                6 - 20 mg/dL       Creatinine, Ser             1.15 (H)          0.44 - 1.00 *      Calcium                      9.0               8.9 - 10.3 m*      Total Protein               6.9               6.5 - 8.1 g/*      Albumin                     3.5               3.5 - 5.0 g/*      AST                         15                15 - 41 U/L        ALT                         15                0 - 44 U/L         Alkaline Phosphatase        59                38 - 126 U/L       Total Bilirubin             0.7               0.0 - 1.2 mg*      GFR, Estimated              58 (L)            >60 mL/min         Anion gap  10                5 - 15         UA and UDS pending collection    Imaging Studies ordered:  I ordered imaging studies including CT head Per radiologist interpretation - no acute finding   Cardiac Monitoring:  The patient was maintained on a cardiac monitor.  I personally viewed and interpreted the cardiac monitored which showed an underlying rhythm of:  EKG Interpretation Date/Time:  Sunday January 17 2024 19:56:32 EDT Ventricular Rate:  63 PR Interval:  151 QRS Duration:  85 QT Interval:  498 QTC Calculation: 510 R Axis:   53  Text Interpretation: Sinus rhythm Probable left atrial enlargement Borderline prolonged QT interval Confirmed by Neysa Clap [phone]) on 01/17/2024 11:26:36 PM    Test Considered:  N/a   Critical Interventions:  N/a   Consultations Obtained:  I  requested consultation with the n/a,  and discussed lab and imaging findings as well as pertinent plan - they recommend: n/a   Problem List / ED Course:  Patient to ED by EMS after lying down in the street On arrival, she is nonverbal, responds to painful stimulus, occasionally mumbles On multiple re-evaluations, patient is more awake, still does not verbalize responses, moves all extremities, turning over in the bed Labs show hypokalemia to 2.6 - IV potassium supplement ordered, 4 runs 10 mEq Hgb stable at 8.1 Head CT without acute findings History of frequent visits with polysubstance abuse, likely the cause for presentation tonight Discussed with attending Dr. Neysa: Plan is to observe in ED for continued improvement, complete potassium and consider discharge.   Disposition:  After consideration of the diagnostic results and the patients response to treatment, I feel that the patient would benefit from: Disposition to be determined by oncoming treatment team after observation overnight in ED. SABRA   Amount and/or Complexity of Data Reviewed Labs: ordered. Radiology: ordered.  Risk Prescription drug management.        Final diagnoses:  Altered mental status, unspecified altered mental status type  History of substance abuse [facility])    ED Discharge Orders     None          Odell Balls, PA-C 01/17/24 2345    Neysa Clap PARAS, DO 01/17/24 2356

## 2024-01-17 NOTE — ED Notes (Signed)
 Patient to CT.

## 2024-01-17 NOTE — ED Triage Notes (Signed)
 Patient BIB [facility] after a bystander called 911 after finding her lying in the middle of the road, unresponsive.  According to bystanders, she slumped over in the middle of the road, no trauma noted.  Patient arrives responsive to painful stimuli, with her tongue sticking out.  CBG 211 160/100 RR 14

## 2024-01-17 NOTE — ED Provider Notes (Signed)
  Physical Exam  BP (!) 161/97   Pulse (!) 47   Temp 98.9 F (37.2 C) (Oral)   Resp 16   Wt 66 kg   LMP 12/08/2023 (Exact Date)   SpO2 100%   BMI 24.98 kg/m   Physical Exam  Procedures  Procedures  ED Course / MDM   Clinical Course as of 01/17/24 2353  [place] Jan 17, 2024  2351 MTF.   [RS]    Clinical Course User Index [RS] Bobette Pleasant SAUNDERS, PA-C   Medical Decision Making Amount and/or Complexity of Data Reviewed Labs: ordered. Radiology: ordered.  Risk Prescription drug management.   Care of this patient assumed from preceding ED provider, S. Upstill, PA-C at time of shift change. Please see her associated note for further insight into the patient's ED course. In brief, presented with AMS, appearing intoxicated. Hx of polysubstance abuse. Metabolizing with improvement in mental status. hypoK, being repleted IV at this time. Plan for MTF; low threshold for admission if mental status does not improve.   Patient metabolized, ambulated in the ED. Safe for discharge home.   This chart was dictated using voice recognition software, Dragon. Despite the best efforts of this provider to proofread and correct errors, errors may still occur which can change documentation meaning.      Bobette Pleasant SAUNDERS, PA-C 01/18/24 9365    Carita Senior, MD 01/18/24 [phone]

## 2024-01-18 ENCOUNTER — Other Ambulatory Visit (HOSPITAL_COMMUNITY): Payer: Self-pay

## 2024-01-18 ENCOUNTER — Other Ambulatory Visit: Payer: Self-pay

## 2024-01-18 MED ORDER — POTASSIUM CHLORIDE CRYS ER 20 MEQ PO TBCR
20.0000 meq | EXTENDED_RELEASE_TABLET | Freq: Once | ORAL | 0 refills | Status: DC
  Filled 2024-01-18: qty 3, 3d supply, fill #0

## 2024-01-18 NOTE — Discharge Instructions (Addendum)
 Your potassium was low in the ER today.  Please take the prescribed potassium supplement for the entire course and follow-up with primary care doctor in the outpatient setting.  Return to the ER with any new severe symptoms.

## 2024-02-28 ENCOUNTER — Emergency Department (HOSPITAL_COMMUNITY)
Admission: EM | Admit: 2024-02-28 | Discharge: 2024-02-29 | Disposition: A | Discharge status: Home / Self Care | Payer: MEDICAID | Attending: Emergency Medicine | Admitting: Emergency Medicine

## 2024-02-28 ENCOUNTER — Emergency Department (HOSPITAL_COMMUNITY): Payer: MEDICAID

## 2024-02-28 ENCOUNTER — Other Ambulatory Visit: Payer: Self-pay

## 2024-02-28 DIAGNOSIS — J069 Acute upper respiratory infection, unspecified: Secondary | ICD-10-CM

## 2024-02-28 DIAGNOSIS — J3489 Other specified disorders of nose and nasal sinuses: Secondary | ICD-10-CM | POA: Insufficient documentation

## 2024-02-28 DIAGNOSIS — Z8673 Personal history of transient ischemic attack (TIA), and cerebral infarction without residual deficits: Secondary | ICD-10-CM | POA: Diagnosis not present

## 2024-02-28 DIAGNOSIS — R059 Cough, unspecified: Secondary | ICD-10-CM | POA: Insufficient documentation

## 2024-02-28 DIAGNOSIS — F1910 Other psychoactive substance abuse, uncomplicated: Secondary | ICD-10-CM | POA: Diagnosis not present

## 2024-02-28 DIAGNOSIS — D649 Anemia, unspecified: Secondary | ICD-10-CM | POA: Diagnosis not present

## 2024-02-28 DIAGNOSIS — R110 Nausea: Secondary | ICD-10-CM | POA: Insufficient documentation

## 2024-02-28 DIAGNOSIS — R079 Chest pain, unspecified: Secondary | ICD-10-CM | POA: Diagnosis present

## 2024-02-28 DIAGNOSIS — Z7984 Long term (current) use of oral hypoglycemic drugs: Secondary | ICD-10-CM | POA: Insufficient documentation

## 2024-02-28 DIAGNOSIS — Z91040 Latex allergy status: Secondary | ICD-10-CM | POA: Insufficient documentation

## 2024-02-28 DIAGNOSIS — R197 Diarrhea, unspecified: Secondary | ICD-10-CM | POA: Insufficient documentation

## 2024-02-28 DIAGNOSIS — E119 Type 2 diabetes mellitus without complications: Secondary | ICD-10-CM | POA: Insufficient documentation

## 2024-02-28 DIAGNOSIS — R0981 Nasal congestion: Secondary | ICD-10-CM | POA: Insufficient documentation

## 2024-02-28 DIAGNOSIS — R400 Somnolence: Secondary | ICD-10-CM | POA: Insufficient documentation

## 2024-02-28 DIAGNOSIS — R0789 Other chest pain: Secondary | ICD-10-CM | POA: Insufficient documentation

## 2024-02-28 LAB — CBC WITH DIFFERENTIAL/PLATELET
Basophils Absolute: 0 K/uL (ref 0.0–0.1)
Basophils Relative: 0 %
Eosinophils Absolute: 0.3 K/uL (ref 0.0–0.5)
Eosinophils Relative: 7 %
HCT: 33.5 % — ABNORMAL LOW (ref 36.0–46.0)
Hemoglobin: 9.6 g/dL — ABNORMAL LOW (ref 12.0–15.0)
Lymphocytes Relative: 33 %
Lymphs Abs: 1.6 K/uL (ref 0.7–4.0)
MCH: 23.6 pg — ABNORMAL LOW (ref 26.0–34.0)
MCHC: 28.7 g/dL — ABNORMAL LOW (ref 30.0–36.0)
MCV: 82.3 fL (ref 80.0–100.0)
Monocytes Absolute: 0.5 K/uL (ref 0.1–1.0)
Monocytes Relative: 10 %
Neutro Abs: 2.4 K/uL (ref 1.7–7.7)
Neutrophils Relative %: 50 %
Platelets: 364 K/uL (ref 150–400)
RBC: 4.07 MIL/uL (ref 3.87–5.11)
RDW: 23.9 % — ABNORMAL HIGH (ref 11.5–15.5)
WBC: 4.7 K/uL (ref 4.0–10.5)
nRBC: 0 % (ref 0.0–0.2)

## 2024-02-28 LAB — COMPREHENSIVE METABOLIC PANEL WITH GFR
ALT: 23 U/L (ref 0–44)
AST: 18 U/L (ref 15–41)
Albumin: 3.6 g/dL (ref 3.5–5.0)
Alkaline Phosphatase: 69 U/L (ref 38–126)
Anion gap: 11 (ref 5–15)
BUN: 15 mg/dL (ref 6–20)
CO2: 23 mmol/L (ref 22–32)
Calcium: 8.8 mg/dL — ABNORMAL LOW (ref 8.9–10.3)
Chloride: 105 mmol/L (ref 98–111)
Creatinine, Ser: 0.79 mg/dL (ref 0.44–1.00)
GFR, Estimated: >60 mL/min (ref >60)
Glucose, Bld: 98 mg/dL (ref 70–99)
Potassium: 3.7 mmol/L (ref 3.5–5.1)
Sodium: 139 mmol/L (ref 135–145)
Total Bilirubin: 0.6 mg/dL (ref 0.0–1.2)
Total Protein: 7.5 g/dL (ref 6.5–8.1)

## 2024-02-28 LAB — RESP PANEL BY RT-PCR (RSV, FLU A&B, COVID)  RVPGX2
Influenza A by PCR: NEGATIVE
Influenza B by PCR: NEGATIVE
Resp Syncytial Virus by PCR: NEGATIVE
SARS Coronavirus 2 by RT PCR: NEGATIVE

## 2024-02-28 LAB — CBG MONITORING, ED
Glucose-Capillary: 39 mg/dL — CL (ref 70–99)
Glucose-Capillary: 93 mg/dL (ref 70–99)

## 2024-02-28 LAB — CK: Total CK: 70 U/L (ref 38–234)

## 2024-02-28 LAB — HCG, SERUM, QUALITATIVE: Preg, Serum: NEGATIVE

## 2024-02-28 LAB — ETHANOL: Alcohol, Ethyl (B): <15 mg/dL (ref <15)

## 2024-02-28 LAB — TROPONIN I (HIGH SENSITIVITY): Troponin I (High Sensitivity): 3 ng/L (ref <18)

## 2024-02-28 NOTE — Discharge Instructions (Signed)
 Please follow-up with your primary care provider.  Seek emergency care if experiencing any new or worsening symptoms.

## 2024-02-28 NOTE — ED Notes (Signed)
 Attempted to get blood from pt; pt states  your trying to kill me. Stop-  phlebotomy made aware and at the bedside

## 2024-02-28 NOTE — ED Notes (Signed)
 Patient transported to CT

## 2024-02-28 NOTE — ED Notes (Signed)
 Pt notified that a urine sample was needed.

## 2024-02-28 NOTE — ED Notes (Signed)
 Patient transported to X-ray

## 2024-02-28 NOTE — ED Triage Notes (Signed)
 Pt BIB [facility] from home. Pt called 911 reporting she doesn't feel well. On EMS arrival pt refused to tell EMS what was wrong, just repeated  I dont feel well and want to go to [facility]. Pt also reported she has not been taking her home meds.

## 2024-02-28 NOTE — ED Provider Notes (Signed)
 [place] EM[facility] DE[facility]MENT AT [facility] Provider Note   CSN: [id number] Arrival date & time: 02/28/24  2042     Patient presents with: Generalized Body Aches   Kaitlyn Gray is a 48 y.o. female with PMHx polysubstance abuse, bipolar disorder, DM, stroke with chronic left sided deficits, IDA, schizoaffective disorder, who presents to ED concerned for rhinorrhea, congestion, cough, chest pain, nausea, and diarrhea. Cough started around 1 week ago. Central chest pain started 3 days ago. Last episode of diarrhea was yesterday. Patient denies fever, vomiting, or hematochezia. Denies recent hospitalizations/surgeries, hormone use, hemoptysis. No Hx of DVT/PE seen in chart.  Patient somnolent during initial interview but does awake easily to voice. Patient stating that she is sleepy d/t not getting good rest recently.   EMS stating that patient hasn't taken her medications recently. Patient denies SI/HI to me.   HPI     Prior to Admission medications   Medication Sig Start Date End Date Taking? Authorizing Provider  ARIPiprazole  (ABILIFY ) 20 MG tablet Take 1 tablet (20 mg total) by mouth daily for 15 days. Patient not taking: Reported on 12/28/2023 12/09/23 12/24/23  Mannie Jerel PARAS, NP  ARIPiprazole  ER (ABILIFY  MAINTENA) 400 MG SRER injection Inject 1.5 mLs (300 mg total) into the muscle every 28 (twenty-eight) days. Patient not taking: Reported on 12/28/2023 11/21/23   Paliy, Alisa, MD  ferrous sulfate  325 (65 FE) MG EC tablet Take 325 mg by mouth daily. Patient not taking: Reported on 12/13/2023    [provider]  gabapentin  (NEURONTIN ) 100 MG capsule Take 100 mg by mouth 3 (three) times daily. Patient not taking: Reported on 12/13/2023    [provider]  losartan  (COZAAR ) 100 MG tablet Take 100 mg by mouth daily. Patient not taking: Reported on 12/13/2023    [provider]  metFORMIN  (GLUCOPHAGE ) 500 MG tablet Take 1 tablet (500 mg total) by  mouth 2 (two) times daily with a meal. Patient not taking: Reported on 12/13/2023 10/26/23   Paliy, Alisa, MD  potassium chloride  SA (KLOR-CON  M) 20 MEQ tablet Take 1 tablet (20 mEq total) by mouth once for 1 dose. 01/18/24 01/21/24  Sponseller, Pleasant SAUNDERS, PA-C  QUEtiapine  (SEROQUEL ) 25 MG tablet Take 1 tablet (25 mg total) by mouth at bedtime. Patient not taking: Reported on 12/28/2023 12/09/23   Mannie Jerel PARAS, NP    Allergies: Tomato, Hydrocodone , and Latex    Review of Systems  Respiratory:  Positive for cough.     Updated Vital Signs BP (!) 186/99 (BP Location: Right Arm)   Pulse 79   Temp 98.1 F (36.7 C) (Oral)   Resp (!) 22   Ht 5' 4 (1.626 m)   Wt 66 kg   SpO2 99%   BMI 24.98 kg/m   Physical Exam Vitals and nursing note reviewed.  Constitutional:      General: She is not in acute distress.    Appearance: She is not ill-appearing or toxic-appearing.  HENT:     Head: Normocephalic and atraumatic.     Mouth/Throat:     Mouth: Mucous membranes are moist.  Eyes:     General: No scleral icterus.       Right eye: No discharge.        Left eye: No discharge.     Conjunctiva/sclera: Conjunctivae normal.  Cardiovascular:     Rate and Rhythm: Normal rate and regular rhythm.     Pulses: Normal pulses.     Heart sounds: Normal  heart sounds. No murmur heard.    Comments: Anterior chest wall is tender to palpation Pulmonary:     Effort: Pulmonary effort is normal. No respiratory distress.     Breath sounds: Normal breath sounds. No wheezing, rhonchi or rales.  Abdominal:     General: Abdomen is flat. Bowel sounds are normal. There is no distension.     Palpations: Abdomen is soft. There is no mass.     Tenderness: There is no abdominal tenderness.  Musculoskeletal:     Right lower leg: No edema.     Left lower leg: No edema.     Comments: No calf swelling or erythema appreciated  Skin:    General: Skin is warm and dry.     Findings: No rash.  Neurological:     General:  No focal deficit present.     Mental Status: She is alert. Mental status is at baseline.  Psychiatric:        Mood and Affect: Mood normal.     Comments: Somnolent but does respond to voice and answer questions appropriately.     (all labs ordered are listed, but only abnormal results are displayed) Labs Reviewed  RESP PANEL BY RT-PCR (RSV, FLU A&B, COVID)  [id number]  CBC WITH DIFFERENTIAL/PLATELET  RAPID URINE DRUG SCREEN, HOSP PERFORMED  ETHANOL  COMPREHENSIVE METABOLIC PANEL WITH GFR  CK  HCG, SERUM, QUALITATIVE  TROPONIN I (HIGH SENSITIVITY)    EKG: None  Radiology: No results found.   Procedures   Medications Ordered in the ED - No data to display  Clinical Course as of 02/28/24 2109  [place] Feb 28, 2024  2105 Cough for a week, CP x3d, nausea, diarrhea [LS]    Clinical Course User Index [LS] Rogelia Jerilynn RAMAN, MD                                 Medical Decision Making Amount and/or Complexity of Data Reviewed Labs: ordered. Radiology: ordered.   This patient presents to the ED for concern of chest pain, this involves an extensive number of treatment options, and is a complaint that carries with it a high risk of complications and morbidity.  The differential diagnosis includes acute coronary syndrome, congestive heart failure, pericarditis, pneumonia, pulmonary embolism, tension pneumothorax, esophageal rupture, aortic dissection, cardiac tamponade, musculoskeletal   Co morbidities that complicate the patient evaluation  polysubstance abuse, bipolar disorder, DM, stroke with chronic left sided deficits, IDA, schizoaffective disorder,   Additional history obtained:  Dr. Tanda PCP   Risk Stratification Score:  HEART Score: 1 point (low) PERC Score: 0   Problem List / ED Course / Critical interventions / Medication management  Patient presented for chest pain. Patient somnolent but responds to voice well. Also endorsing productive rhinorrhea,  congestion, cough, nausea, and diarrhea recently. Physical exam reassuring. Patient afebrile with stable vitals.  I Ordered, and personally interpreted labs.  CK within normal limits.  hCG negative.  CBC without leukocytosis.  There is anemia near patient's baseline currently with hemoglobin at 9.6 today.  CMP reassuring.  CBG 93.  Respiratory panel negative.  EtOH negative. The patient was maintained on a cardiac monitor.  I personally viewed and interpreted the EKG/cardiac monitored which showed an underlying rhythm of: Sinus rhythm. I ordered imaging studies including chest xray to assess for process contributing to patient's symptoms. I independently visualized and interpreted imaging which showed no acute cardiopulmonary disease. I  agree with the radiologist interpretation. Shared all results with patient.  Answered all questions.  Patient still appears somnolent and falling asleep quickly.  Urine not yet obtained but we will also obtain CT head out of abundance of precaution. I have reviewed the patients home medicines and have made adjustments as needed   Social Determinants of Health:  None  11:32 PM Care of Kaitlyn Gray transferred to PA Rob  at the end of my shift as the patient will require reassessment once labs/imaging have resulted. Patient presentation, ED course, and plan of care discussed with review of all pertinent labs and imaging. Please see his/her note for further details regarding further ED course and disposition. Plan at time of handoff is reassess patient after UDS and CT head. This may be altered or completely changed at the discretion of the oncoming team pending results of further workup.       Final diagnoses:  None    ED Discharge Orders     None          Kaitlyn Gray 02/28/24 2336    Rogelia Jerilynn RAMAN, MD 03/02/24 [phone]

## 2024-02-29 LAB — URINALYSIS, ROUTINE W REFLEX MICROSCOPIC
Bilirubin Urine: NEGATIVE
Glucose, UA: NEGATIVE mg/dL
Hgb urine dipstick: NEGATIVE
Ketones, ur: NEGATIVE mg/dL
Leukocytes,Ua: NEGATIVE
Nitrite: NEGATIVE
Protein, ur: NEGATIVE mg/dL
Specific Gravity, Urine: 1.021 (ref 1.005–1.030)
pH: 6 (ref 5.0–8.0)

## 2024-02-29 LAB — RAPID URINE DRUG SCREEN, HOSP PERFORMED
Amphetamines: NOT DETECTED
Barbiturates: NOT DETECTED
Benzodiazepines: NOT DETECTED
Cocaine: POSITIVE — AB
Opiates: NOT DETECTED
Tetrahydrocannabinol: POSITIVE — AB

## 2024-02-29 NOTE — ED Notes (Signed)
 Set up medicaid cab for patient. ETA 1000.

## 2024-02-29 NOTE — ED Provider Notes (Signed)
 Patient signed out to me.  She is now awake and alert and requesting to be discharged.  She is calling for a ride.  CT head was reassuring.  UDS is positive for cocaine and THC.   Vicky Charleston, PA-C 02/29/24 9663    Haze Lonni PARAS, MD 02/29/24 [phone]

## 2024-02-29 NOTE — ED Notes (Signed)
Sandwich and water provided to  the pt.  

## 2024-03-01 ENCOUNTER — Emergency Department (HOSPITAL_COMMUNITY): Payer: MEDICAID

## 2024-03-01 ENCOUNTER — Encounter (HOSPITAL_COMMUNITY): Payer: Self-pay

## 2024-03-01 ENCOUNTER — Emergency Department (HOSPITAL_COMMUNITY)
Admission: EM | Admit: 2024-03-01 | Discharge: 2024-03-03 | Disposition: A | Discharge status: Other Acute Inpatient Hospital | Payer: MEDICAID | Attending: Emergency Medicine | Admitting: Emergency Medicine

## 2024-03-01 ENCOUNTER — Other Ambulatory Visit: Payer: Self-pay

## 2024-03-01 DIAGNOSIS — I10 Essential (primary) hypertension: Secondary | ICD-10-CM | POA: Insufficient documentation

## 2024-03-01 DIAGNOSIS — R531 Weakness: Secondary | ICD-10-CM | POA: Insufficient documentation

## 2024-03-01 DIAGNOSIS — Z8673 Personal history of transient ischemic attack (TIA), and cerebral infarction without residual deficits: Secondary | ICD-10-CM | POA: Insufficient documentation

## 2024-03-01 DIAGNOSIS — Z91040 Latex allergy status: Secondary | ICD-10-CM | POA: Insufficient documentation

## 2024-03-01 DIAGNOSIS — Z7984 Long term (current) use of oral hypoglycemic drugs: Secondary | ICD-10-CM | POA: Diagnosis not present

## 2024-03-01 DIAGNOSIS — F1490 Cocaine use, unspecified, uncomplicated: Secondary | ICD-10-CM | POA: Diagnosis present

## 2024-03-01 DIAGNOSIS — F1910 Other psychoactive substance abuse, uncomplicated: Secondary | ICD-10-CM | POA: Diagnosis present

## 2024-03-01 DIAGNOSIS — E119 Type 2 diabetes mellitus without complications: Secondary | ICD-10-CM | POA: Diagnosis not present

## 2024-03-01 DIAGNOSIS — R45851 Suicidal ideations: Secondary | ICD-10-CM | POA: Diagnosis not present

## 2024-03-01 DIAGNOSIS — R440 Auditory hallucinations: Secondary | ICD-10-CM | POA: Insufficient documentation

## 2024-03-01 DIAGNOSIS — Z79899 Other long term (current) drug therapy: Secondary | ICD-10-CM | POA: Diagnosis not present

## 2024-03-01 DIAGNOSIS — R441 Visual hallucinations: Secondary | ICD-10-CM | POA: Diagnosis not present

## 2024-03-01 LAB — COMPREHENSIVE METABOLIC PANEL WITH GFR
ALT: 18 U/L (ref 0–44)
AST: 17 U/L (ref 15–41)
Albumin: 4.6 g/dL (ref 3.5–5.0)
Alkaline Phosphatase: 99 U/L (ref 38–126)
Anion gap: 13 (ref 5–15)
BUN: 12 mg/dL (ref 6–20)
CO2: 23 mmol/L (ref 22–32)
Calcium: 9.6 mg/dL (ref 8.9–10.3)
Chloride: 105 mmol/L (ref 98–111)
Creatinine, Ser: 0.82 mg/dL (ref 0.44–1.00)
GFR, Estimated: >60 mL/min (ref >60)
Glucose, Bld: 100 mg/dL — ABNORMAL HIGH (ref 70–99)
Potassium: 3.8 mmol/L (ref 3.5–5.1)
Sodium: 141 mmol/L (ref 135–145)
Total Bilirubin: 0.4 mg/dL (ref 0.0–1.2)
Total Protein: 9.1 g/dL — ABNORMAL HIGH (ref 6.5–8.1)

## 2024-03-01 LAB — DIFFERENTIAL
Abs Immature Granulocytes: 0 K/uL (ref 0.00–0.07)
Basophils Absolute: 0 K/uL (ref 0.0–0.1)
Basophils Relative: 1 %
Eosinophils Absolute: 0.1 K/uL (ref 0.0–0.5)
Eosinophils Relative: 2 %
Immature Granulocytes: 0 %
Lymphocytes Relative: 24 %
Lymphs Abs: 1.2 K/uL (ref 0.7–4.0)
Monocytes Absolute: 0.3 K/uL (ref 0.1–1.0)
Monocytes Relative: 6 %
Neutro Abs: 3.3 K/uL (ref 1.7–7.7)
Neutrophils Relative %: 67 %
Smear Review: NORMAL

## 2024-03-01 LAB — I-STAT CHEM 8, ED
BUN: 14 mg/dL (ref 6–20)
Calcium, Ion: 1.24 mmol/L (ref 1.15–1.40)
Chloride: 106 mmol/L (ref 98–111)
Creatinine, Ser: 0.9 mg/dL (ref 0.44–1.00)
Glucose, Bld: 81 mg/dL (ref 70–99)
HCT: 38 % (ref 36.0–46.0)
Hemoglobin: 12.9 g/dL (ref 12.0–15.0)
Potassium: 3.6 mmol/L (ref 3.5–5.1)
Sodium: 142 mmol/L (ref 135–145)
TCO2: 22 mmol/L (ref 22–32)

## 2024-03-01 LAB — ETHANOL: Alcohol, Ethyl (B): <15 mg/dL (ref <15)

## 2024-03-01 LAB — PROTIME-INR
INR: 1 (ref 0.8–1.2)
Prothrombin Time: 14 s (ref 11.4–15.2)

## 2024-03-01 LAB — CBC
HCT: 38.3 % (ref 36.0–46.0)
Hemoglobin: 10.7 g/dL — ABNORMAL LOW (ref 12.0–15.0)
MCH: 22.7 pg — ABNORMAL LOW (ref 26.0–34.0)
MCHC: 27.9 g/dL — ABNORMAL LOW (ref 30.0–36.0)
MCV: 81.3 fL (ref 80.0–100.0)
Platelets: 431 K/uL — ABNORMAL HIGH (ref 150–400)
RBC: 4.71 MIL/uL (ref 3.87–5.11)
RDW: 24.2 % — ABNORMAL HIGH (ref 11.5–15.5)
WBC: 4.9 K/uL (ref 4.0–10.5)
nRBC: 0 % (ref 0.0–0.2)

## 2024-03-01 LAB — APTT: aPTT: 31 s (ref 24–36)

## 2024-03-01 LAB — HCG, SERUM, QUALITATIVE: Preg, Serum: NEGATIVE

## 2024-03-01 MED ORDER — LORAZEPAM 1 MG PO TABS
1.0000 mg | ORAL_TABLET | Freq: Once | ORAL | Status: AC
  Administered 2024-03-01: 1 mg via ORAL
  Filled 2024-03-01: qty 1

## 2024-03-01 NOTE — ED Triage Notes (Addendum)
 Pt BIB EMS from home, pt states that she doesn't not feel right and reports getting some bad cocaine. Pt states that her leg and back hurt.

## 2024-03-01 NOTE — ED Notes (Signed)
 Unable to collect urine at this time.  Pt states she will as soon as she goes to the restroom.

## 2024-03-01 NOTE — Discharge Instructions (Addendum)
 Your workup today was reassuring.  You did not suffer a stroke today.  Please stop using cocaine and follow-up with a primary care doctor

## 2024-03-01 NOTE — ED Notes (Signed)
 When attempted to discharge patient, pt was sleeping in room, hard to arouse. Responded two words and went back to sleep immediately.

## 2024-03-01 NOTE — ED Notes (Signed)
 Pt was given a sandwich, saltines, cheese and sprite to drink.  She ate all of this w/o any problems.

## 2024-03-01 NOTE — ED Provider Notes (Signed)
 [place] EMERGENCY DEPARTMENT AT [facility] Provider Note   CSN: [id number] Arrival date & time: 03/01/24  9040     Patient presents with: Drug Problem   Kaitlyn Gray is a 48 y.o. female.    Drug Problem  Patient indicates to me that she took some bad cocaine last night and states that she normally smokes cocaine and at this time the cocaine tasted strange.  She states she went to sleep feeling normal but around 7 AM this morning she woke up and states that she could not move.  After further questioning she indicates that she could not move her legs and after further questioning it seems that is not specifically her right leg she states she does not feel quite right but has trouble with word finding.  She denies any pain currently denies any nausea or vomiting no fevers at home.     Prior to Admission medications   Medication Sig Start Date End Date Taking? Authorizing Provider  ARIPiprazole  (ABILIFY ) 20 MG tablet Take 1 tablet (20 mg total) by mouth daily for 15 days. Patient not taking: Reported on 12/28/2023 12/09/23 12/24/23  Mannie Jerel PARAS, NP  ARIPiprazole  ER (ABILIFY  MAINTENA) 400 MG SRER injection Inject 1.5 mLs (300 mg total) into the muscle every 28 (twenty-eight) days. Patient not taking: Reported on 12/28/2023 11/21/23   Paliy, Alisa, MD  ferrous sulfate  325 (65 FE) MG EC tablet Take 325 mg by mouth daily. Patient not taking: Reported on 12/13/2023    [provider]  gabapentin  (NEURONTIN ) 100 MG capsule Take 100 mg by mouth 3 (three) times daily. Patient not taking: Reported on 12/13/2023    [provider]  losartan  (COZAAR ) 100 MG tablet Take 100 mg by mouth daily. Patient not taking: Reported on 12/13/2023    [provider]  metFORMIN  (GLUCOPHAGE ) 500 MG tablet Take 1 tablet (500 mg total) by mouth 2 (two) times daily with a meal. Patient not taking: Reported on 12/13/2023 10/26/23   Paliy, Alisa, MD  potassium chloride  SA  (KLOR-CON  M) 20 MEQ tablet Take 1 tablet (20 mEq total) by mouth once for 1 dose. 01/18/24 01/21/24  Sponseller, Pleasant R, PA-C  QUEtiapine  (SEROQUEL ) 25 MG tablet Take 1 tablet (25 mg total) by mouth at bedtime. Patient not taking: Reported on 12/28/2023 12/09/23   Mannie Jerel PARAS, NP    Allergies: Tomato, Hydrocodone , and Latex    Review of Systems  Updated Vital Signs BP (!) 192/92   Pulse 80   Temp 98.3 F (36.8 C) (Oral)   Resp 20   SpO2 100%   Physical Exam Vitals and nursing note reviewed.  Constitutional:      General: She is not in acute distress. HENT:     Head: Normocephalic and atraumatic.     Nose: Nose normal.  Eyes:     General: No scleral icterus. Cardiovascular:     Rate and Rhythm: Normal rate and regular rhythm.     Pulses: Normal pulses.     Heart sounds: Normal heart sounds.  Pulmonary:     Effort: Pulmonary effort is normal. No respiratory distress.     Breath sounds: No wheezing.  Abdominal:     Palpations: Abdomen is soft.     Tenderness: There is no abdominal tenderness.  Musculoskeletal:     Cervical back: Normal range of motion.     Right lower leg: No edema.     Left lower leg: No edema.  Skin:  General: Skin is warm and dry.     Capillary Refill: Capillary refill takes less than 2 seconds.  Neurological:     Mental Status: She is alert. Mental status is at baseline.     Comments: Alert and oriented to self, place, time and event.   Speech is fluent, clear without dysarthria or dysphasia.   Right upper extremity with 4/5 strength and 3/5 strength in right grip  Left upper extremity with 5/5 strength throughout  sensation intact in upper/lower extremities     CN I not tested  CN II grossly intact visual fields bilaterally. Did not visualize posterior eye.  CN III, IV, VI PERRLA and EOMs intact bilaterally  CN V Intact sensation to sharp and light touch to the face  CN VII facial movements symmetric  CN VIII not tested  CN IX, X no  uvula deviation, symmetric rise of soft palate  CN XI 5/5 SCM and trapezius strength bilaterally  CN XII Midline tongue protrusion, symmetric L/R movements     Psychiatric:        Mood and Affect: Mood normal.        Behavior: Behavior normal.     (all labs ordered are listed, but only abnormal results are displayed) Labs Reviewed  CBC - Abnormal; Notable for the following components:      Result Value   Hemoglobin 10.7 (*)    MCH 22.7 (*)    MCHC 27.9 (*)    RDW 24.2 (*)    Platelets 431 (*)    All other components within normal limits  COMPREHENSIVE METABOLIC PANEL WITH GFR - Abnormal; Notable for the following components:   Glucose, Bld 100 (*)    Total Protein 9.1 (*)    All other components within normal limits  ETHANOL  PROTIME-INR  APTT  DIFFERENTIAL  HCG, SERUM, QUALITATIVE  URINE DRUG SCREEN  I-STAT CHEM 8, ED    EKG: None  Radiology: CT Head Wo Contrast Result Date: 03/01/2024 CLINICAL DATA:  Headache, neuro deficit. Right hand and leg weakness. EXAM: CT HEAD WITHOUT CONTRAST TECHNIQUE: Contiguous axial images were obtained from the base of the skull through the vertex without intravenous contrast. RADIATION DOSE REDUCTION: This exam was performed according to the departmental dose-optimization program which includes automated exposure control, adjustment of the mA and/or kV according to patient size and/or use of iterative reconstruction technique. COMPARISON:  Head CT 02/28/2024 and MRI 12/13/2023 FINDINGS: Brain: There is no evidence of an acute infarct, intracranial hemorrhage, mass, midline shift, or extra-axial fluid collection. Chronic cortical infarcts are again noted in the left frontoparietal region and medial right occipital lobe, and there are also chronic infarcts involving the pons and right middle cerebellar peduncle with associated right-sided cerebellar volume loss. Hypodensities elsewhere in the cerebral white matter bilaterally are similar to the  prior CT and nonspecific but compatible with age advanced chronic small vessel ischemic disease. Vascular: Calcified atherosclerosis at the skull base. No hyperdense vessel. Skull: No acute fracture or suspicious lesion. Sinuses/Orbits: Remote medial right orbital fracture. Persistent moderate mucosal thickening in the left sphenoid sinus. Clear mastoid air cells. Other: None. IMPRESSION: 1. No evidence of acute intracranial abnormality. 2. Age advanced chronic ischemia with multiple chronic infarcts as above. Electronically Signed   By: Dasie Hamburg M.D.   On: 03/01/2024 14:43   CT Head Wo Contrast Result Date: 02/28/2024 EXAM: CT HEAD WITHOUT CONTRAST 02/28/2024 11:39:52 PM TECHNIQUE: CT of the head was performed without the administration of intravenous contrast.  Automated exposure control, iterative reconstruction, and/or weight based adjustment of the mA/kV was utilized to reduce the radiation dose to as low as reasonably achievable. COMPARISON: None available. CLINICAL HISTORY: Headache, neuro deficit. FINDINGS: BRAIN AND VENTRICLES: Parenchymal volume loss is commensurate with the patient's age. Periventricular white matter changes are present likely reflecting the sequela of small vessel ischemia. Remote cortical infarcts noted within the right occipital and left parietal cortex. No acute hemorrhage. No hydrocephalus. No extra-axial collection. No mass effect or midline shift. ORBITS: No acute abnormality. SINUSES: Near complete opacification of the left sphenoid sinus. Remaining paranasal sinuses are clear. SOFT TISSUES AND SKULL: No acute soft tissue abnormality. No skull fracture. IMPRESSION: 1. No acute intracranial abnormality. 2. Parenchymal volume loss commensurate with the patient's age. 3. Periventricular white matter changes, likely sequela of small vessel ischemia. 4. Remote cortical infarcts in the right occipital and left parietal cortex. 5. Left sphenoid sinusitis. Electronically signed by:  Dorethia Molt MD 02/28/2024 11:44 PM EDT RP Workstation: [id number]   DG Chest 2 View Result Date: 02/28/2024 CLINICAL DATA:  Cough and body aches, initial encounter EXAM: CHEST - 2 VIEW COMPARISON:  10/20/2023 FINDINGS: Cardiac shadow remains enlarged. Lungs are well aerated bilaterally. No focal infiltrate or effusion is seen. No bony abnormality is noted. IMPRESSION: No active cardiopulmonary disease. Electronically Signed   By: Oneil Devonshire M.D.   On: 02/28/2024 21:46     Procedures   Medications Ordered in the ED  LORazepam  (ATIVAN ) tablet 1 mg (has no administration in time range)    Clinical Course as of 03/01/24 1636  Tue Mar 01, 2024  1545 Pending MR. Questionable facial droop. Risk factor include cocaine use and prior stroke. Has had prior stroke, may consider neuro consult regarding MR findings. CT negative. [OZ]    Clinical Course User Index [OZ] Cecily Legrand LABOR, PA-C                                 Medical Decision Making Amount and/or Complexity of Data Reviewed Labs: ordered. Radiology: ordered.   This patient presents to the ED for concern of weakness, this involves a number of treatment options, and is a complaint that carries with it a high risk of complications and morbidity. A differential diagnosis was considered for the patient's symptoms which is discussed below:   The differential diagnosis of weakness includes but is not limited to neurologic causes (GBS, myasthenia gravis, CVA, MS, ALS, transverse myelitis, spinal cord injury, CVA, botulism, ) and other causes: ACS, Arrhythmia, syncope, orthostatic hypotension, sepsis, hypoglycemia, electrolyte disturbance, hypothyroidism, respiratory failure, symptomatic anemia, dehydration, heat injury, polypharmacy, malignancy.    Co morbidities: Discussed in HPI   Brief History:   Patient indicates to me that she took some bad cocaine last night and states that she normally smokes cocaine and at this time the  cocaine tasted strange.  She states she went to sleep feeling normal but around 7 AM this morning she woke up and states that she could not move.  After further questioning she indicates that she could not move her legs and after further questioning it seems that is not specifically her right leg she states she does not feel quite right but has trouble with word finding.  She denies any pain currently denies any nausea or vomiting no fevers at home.    EMR reviewed including pt PMHx, past surgical history and past visits to ER.  See HPI for more details   Lab Tests:   I personally reviewed all laboratory work and imaging. Metabolic panel without any acute abnormality specifically kidney function within normal limits and no significant electrolyte abnormalities. CBC without leukocytosis or significant anemia.   Imaging Studies:  Abnormal findings. I personally reviewed all imaging studies. Imaging notable for Old infarcts noted on CT head no acute findings.  MRI pending   Cardiac Monitoring:  NA NA - pending   Medicines ordered:     Critical Interventions:     Consults/Attending Physician      Reevaluation:  After the interventions noted above I re-evaluated patient and found that they have :stayed the same   Social Determinants of Health:  The patient's social determinants of health were a factor in the care of this patient    Problem List / ED Course:  Patient here with some right upper and lower extremity weakness after cocaine last night.  She woke up with symptoms this may indicate Saturday night palsy versus acute stroke versus perhaps other etiology.  She was noted by nursing staff to be walking without difficulty and moving her arms around and bathing herself before MRI was ordered.  There may be some malingering at play.  However given her symptoms and the risk factor of cocaine use MRI brain pending patient care handed off to oncoming team for  follow-up on MRI.   Dispostion:    Final diagnoses:  None    ED Discharge Orders     None          Neldon Hamp RAMAN, [place] 03/01/24 1636    Geraldene Hamilton, MD 03/04/24 1714

## 2024-03-01 NOTE — ED Notes (Signed)
 Pt yelled to staff from room, by requested assistance going to bathroom. Pt was unsteady on her feet, unable to ambulate without assistance. Pants were changed for new paper pants. Pt still drowsy, however responding to voice

## 2024-03-01 NOTE — ED Provider Notes (Signed)
 Accepted handoff at shift change from [place], PA-C. Please see prior provider note for more detail.   Briefly: Patient is 48 y.o.   DDX: concern for polysubstance abuse, CVA, migraine, AMS  Plan: Disposition per results of MR brain. Suspect likely substance abuse but has previously had stroke.  Physical Exam  BP (!) 172/92 (BP Location: Left Arm)   Pulse 60   Temp 97.7 F (36.5 C) (Axillary)   Resp 15   SpO2 99%   Physical Exam Vitals and nursing note reviewed.  Constitutional:      General: She is not in acute distress.    Appearance: Normal appearance. She is not ill-appearing.  HENT:     Head: Normocephalic and atraumatic.  Cardiovascular:     Rate and Rhythm: Normal rate and regular rhythm.     Pulses: Normal pulses.     Heart sounds: Normal heart sounds.  Pulmonary:     Effort: Pulmonary effort is normal.     Breath sounds: Normal breath sounds.  Neurological:     Mental Status: She is alert.     Procedures  Procedures  ED Course / MDM   Clinical Course as of 03/01/24 1920  Tue Mar 01, 2024  1545 Pending MR. Questionable facial droop. Risk factor include cocaine use and prior stroke. Has had prior stroke, may consider neuro consult regarding MR findings. CT negative. [OZ]    Clinical Course User Index [OZ] Cecily Legrand LABOR, PA-C   Medical Decision Making Amount and/or Complexity of Data Reviewed Labs: ordered. Radiology: ordered.  Risk Prescription drug management.   In brief, here with concerns of getting into bad cocaine. She is concerned for a stroke due to self-reported facial droop but none observed by prior provider. Pending MR of brain at this time.  MR brain negative for signs of stroke. Suspect likely symptoms were secondary to cocaine use. Informed patient of reassuring findings. Patient states that she would like admission for stroke workup. With no acute deficit and normal MR, do not feel that this is needed. Advised patient follow up with  PCP. Return precautions discussed and patient discharged in stable condition.      Etoy Mcdonnell A, PA-C 03/01/24 1920    Tegeler, Lonni PARAS, MD 03/02/24 [phone]

## 2024-03-01 NOTE — ED Notes (Addendum)
 Pt still difficult to arouse, requires physical touch with minimal words of response, RASS-1

## 2024-03-02 DIAGNOSIS — F1490 Cocaine use, unspecified, uncomplicated: Secondary | ICD-10-CM

## 2024-03-02 DIAGNOSIS — R45851 Suicidal ideations: Secondary | ICD-10-CM

## 2024-03-02 LAB — URINALYSIS, ROUTINE W REFLEX MICROSCOPIC
Bilirubin Urine: NEGATIVE
Glucose, UA: NEGATIVE mg/dL
Ketones, ur: NEGATIVE mg/dL
Leukocytes,Ua: NEGATIVE
Nitrite: NEGATIVE
Protein, ur: NEGATIVE mg/dL
RBC / HPF: >50 RBC/hpf (ref 0–5)
Specific Gravity, Urine: 1.025 (ref 1.005–1.030)
pH: 5 (ref 5.0–8.0)

## 2024-03-02 LAB — URINE DRUG SCREEN
Amphetamines: NEGATIVE
Barbiturates: NEGATIVE
Benzodiazepines: POSITIVE — AB
Cocaine: POSITIVE — AB
Fentanyl: NEGATIVE
Methadone Scn, Ur: NEGATIVE
Opiates: NEGATIVE
Tetrahydrocannabinol: NEGATIVE

## 2024-03-02 MED ORDER — ARIPIPRAZOLE 10 MG PO TABS
20.0000 mg | ORAL_TABLET | Freq: Every day | ORAL | Status: DC
  Filled 2024-03-02: qty 4

## 2024-03-02 MED ORDER — QUETIAPINE FUMARATE 25 MG PO TABS
25.0000 mg | ORAL_TABLET | Freq: Every day | ORAL | Status: DC
  Administered 2024-03-02: 25 mg via ORAL
  Filled 2024-03-02: qty 1

## 2024-03-02 MED ORDER — BENZTROPINE MESYLATE 1 MG PO TABS
1.0000 mg | ORAL_TABLET | Freq: Two times a day (BID) | ORAL | Status: DC
  Administered 2024-03-02: 1 mg via ORAL
  Filled 2024-03-02: qty 1

## 2024-03-02 NOTE — Evaluation (Signed)
 Physical Therapy Evaluation Patient Details Name: Kaitlyn Gray MRN: [id number] DOB: [birth year] Today's Date: 03/02/2024  History of Present Illness  48 yo  female brought to ED 9/16 after DC 9/15 with complaints of feeling bad. Several ED visits in past, was at [facility] in 5/25.PMH:  hypertension, diabetes, prior CVA, schizoaffective, polysubstance use  Clinical Impression  Pt admitted with above diagnosis.  Pt currently with functional limitations due to the deficits listed below (see PT Problem List). Pt will benefit from acute skilled PT to increase their independence and safety with mobility to allow discharge.      PT eval limited due to patient lethargy. Speech barely understandable. Patient required Mod assistance to stand from stretcher at [facility], patient unable to take a step with RLE. All movements  of extremities are very slow, left more so  than right , patient reports stroke affected left.  Patient reports that she no longer has a RW. Patient resides in 2 level townhouse, has been independent in past. Pt. Reports no family/ friend support.  Patient currently not aroused enough to participate in functional mobility. Patient  immediately fell asleep after return to supine.  Patient is known to be ambulatory  recently as has been DC'd from ED via bus or cab.   Will return to reassess as patient becomes more alert. RN reports that patient had Ativan  last PM. Continue PT assessment for DC recommendations.     If plan is discharge home, recommend the following: A lot of help with walking and/or transfers;A lot of help with bathing/dressing/bathroom;Assistance with cooking/housework;Assist for transportation;Help with stairs or ramp for entrance   Can travel by private vehicle        Equipment Recommendations Rolling walker (2 wheels)  Recommendations for Other Services       Functional Status Assessment Patient has had a recent decline in their functional status and demonstrates the  ability to make significant improvements in function in a reasonable and predictable amount of time.     Precautions / Restrictions Precautions Precautions: Fall Recall of Precautions/Restrictions: Impaired Restrictions Weight Bearing Restrictions Per Provider Order: No      Mobility  Bed Mobility Overal bed mobility: Needs Assistance Bed Mobility: Sidelying to Sit, Sit to Supine   Sidelying to sit: Min assist   Sit to supine: Mod assist   General bed mobility comments: multimodal cues  to  initiate activity of sitting upright. Required assistance of legs back onto bed, appeared to be falling asleep.    Transfers Overall transfer level: Needs assistance Equipment used: Rolling walker (2 wheels) Transfers: Sit to/from Stand Sit to Stand: Mod assist           General transfer comment: slow to rise from stretcher able to grip RW bilaterally.  Attempted multiple times  to side step , patient  un able to step right, did bring left foot  up to right,  Patient appeared to be falling asleep.    Ambulation/Gait                  Stairs            Wheelchair Mobility     Tilt Bed    Modified Rankin (Stroke Patients Only)       Balance Overall balance assessment: Needs assistance Sitting-balance support: Bilateral upper extremity supported, Feet supported Sitting balance-Leahy Scale: Fair     Standing balance support: Reliant on assistive device for balance, During functional activity, Bilateral upper extremity supported Standing balance-Leahy  Scale: Poor Standing balance comment: lethargic, poor efforts to balance                             Pertinent Vitals/Pain Pain Assessment Pain Assessment: PAINAD Breathing: normal Negative Vocalization: none Facial Expression: smiling or inexpressive Body Language: relaxed Consolability: no need to console PAINAD Score: 0    Home Living Family/patient expects to be discharged to:: Private  residence Living Arrangements: Alone   Type of Home: Apartment Home Access: Stairs to enter Entrance Stairs-Rails: Right Entrance Stairs-Number of Steps: 6  down Alternate Level Stairs-Number of Steps: 13 Home Layout: Two level;Bed/bath upstairs Home Equipment: None Additional Comments: reports rollator taken by [facility] police and no returned    Prior Function Prior Level of Function : Needs assist             Mobility Comments: relies on rollator for ambulation at baseline / Hx of falls / Unsure  how she has been ambulating, apparantly  WFL since epic indicates that she has been DC'd  from ED via bus or cab recently ADLs Comments: at baseline , independent     Extremity/Trunk Assessment   Upper Extremity Assessment Upper Extremity Assessment: Right hand dominant;RUE deficits/detail;LUE deficits/detail RUE Deficits / Details: grossly 3+/5 shoulder flex(supine), weal grip RUE Coordination: decreased fine motor;decreased gross motor LUE Deficits / Details: weak effort to raise arm, arm drifts when placed , unable to peform controlled finger to nose    Lower Extremity Assessment Lower Extremity Assessment: RLE deficits/detail;LLE deficits/detail RLE Deficits / Details: weak effort to  flex hip and knee and supine, stated I need help  finally did perform and assited self to  push up toward bed head. grossly LT intact RLE Coordination: decreased gross motor LLE Deficits / Details: similar to right, weak effort ? due to lethargy,  did flex hip knee slowly to scoot  to [facility].    Cervical / Trunk Assessment Cervical / Trunk Assessment: Normal  Communication   Communication Communication: Impaired Factors Affecting Communication: Difficulty expressing self;Reduced clarity of speech    Cognition Arousal: Lethargic, Suspect due to medications Behavior During Therapy: Flat affect   PT - Cognitive impairments: No family/caregiver present to determine baseline, Difficult to  assess, Orientation, Problem solving, Sequencing, Initiation Difficult to assess due to: Impaired communication Orientation impairments: Time                   PT - Cognition Comments: O x 2, frequently states, I can't do that when asked to move  extremities for strength assessment Following commands: Impaired Following commands impaired: Follows one step commands inconsistently, Follows one step commands with increased time     Cueing Cueing Techniques: Verbal cues, Tactile cues     General Comments      Exercises     Assessment/Plan    PT Assessment Patient needs continued PT services  PT Problem List Decreased strength;Decreased cognition;Decreased range of motion;Decreased knowledge of use of DME;Decreased activity tolerance;Decreased safety awareness;Decreased balance;Decreased knowledge of precautions;Decreased mobility;Decreased coordination       PT Treatment Interventions DME instruction;Gait training;Functional mobility training;Therapeutic activities;Therapeutic exercise;Patient/family education;Cognitive remediation    PT Goals (Current goals can be found in the Care Plan section)  Acute Rehab PT Goals PT Goal Formulation: Patient unable to participate in goal setting Time For Goal Achievement: 03/16/24 Potential to Achieve Goals: Fair    Frequency Min 3X/week     Co-evaluation  AM-PAC PT 6 Clicks Mobility  Outcome Measure Help needed turning from your back to your side while in a flat bed without using bedrails?: A Lot Help needed moving from lying on your back to sitting on the side of a flat bed without using bedrails?: A Lot Help needed moving to and from a bed to a chair (including a wheelchair)?: A Lot Help needed standing up from a chair using your arms (e.g., wheelchair or bedside chair)?: A Lot Help needed to walk in hospital room?: Total Help needed climbing 3-5 steps with a railing? : Total 6 Click Score: 10    End  of Session Equipment Utilized During Treatment: Gait belt Activity Tolerance: Patient limited by lethargy Patient left: in bed Nurse Communication: Mobility status PT Visit Diagnosis: Unsteadiness on feet (R26.81);Other abnormalities of gait and mobility (R26.89)    Time: 9169-9149 PT Time Calculation (min) (ACUTE ONLY): 20 min   Charges:   PT Evaluation $PT Eval Low Complexity: 1 Low   PT General Charges $$ ACUTE PT VISIT: 1 Visit         Darice Potters PT Acute Rehabilitation Services Office [phone]   Potters Darice Norris 03/02/2024, 9:21 AM

## 2024-03-02 NOTE — ED Notes (Signed)
 Patient states she requires assistance to set up her meals.  Patient was able to reach to table to pick up drink and did eat sandwich.

## 2024-03-02 NOTE — Consult Note (Signed)
 [facility] Psychiatric Consult Initial  Patient Name: .Kaitlyn Gray  MRN: [id number]  DOB: [birth year]  Consult Order details:  Orders (From admission, onward)     Start     Ordered   03/02/24 1031  CONSULT TO CALL ACT TEAM       Ordering Provider: Doretha Folks, MD  Provider:  (Not yet assigned)  Question:  Reason for Consult?  Answer:  suicidal thoughts   03/02/24 1030             Mode of Visit: In person    Psychiatry Consult Evaluation  Service Date: March 02, 2024 LOS:  LOS: 0 days  Chief Complaint I want to kill myself  Primary Psychiatric Diagnoses  Suicidal Ideation 2.  Crack cocaine use   Assessment  Kaitlyn Gray is a 48 y.o. female admitted: Presented to the EDfor 03/01/2024 10:07 AM for brought in by EMS from home with complaints of not feeling right after using some bad cocaine. She carries the psychiatric diagnoses of schizoaffective disorder, paranoid schizophrenia, MDD, bipolar disorder, cannabis abuse and crack cocaine use and has a past medical history of HTN, CVA, Type 2 diabetes and anemia.   Her current presentation of stating she wants to die and I can't live this way anymore is most consistent with suicidal ideation. She meets criteria for inpatient psychiatric hospitalization based on being a danger to herself.  Current outpatient psychotropic medications include seroquel , cogentin  and abilify  and historically she has had a positive response to these medications. She was compliant with medications prior to admission as evidenced by patient report. On initial examination, patient is tearful and hopeless. Please see plan below for detailed recommendations.   Diagnoses:  Active Hospital problems: Active Problems:   Suicidal ideation   Crack cocaine use    Plan   ## Psychiatric Medication Recommendations:  Start Home Medications: --abilify  20mg  PO Q day --cogentin  1mg  PO BID --seroquel  25mg  PO Q HS  ## Medical Decision  Making Capacity: Not specifically addressed in this encounter  ## Further Work-up:  -- most recent EKG on 03/01/2024 had QtC of 448 -- Pertinent labwork reviewed earlier this admission includes: CBC, CMP, PT/INR, pregnancy, alcohol, UA and UDS   ## Disposition:-- We recommend inpatient psychiatric hospitalization when medically cleared. Patient is under voluntary admission status at this time; please IVC if attempts to leave hospital.  ## Behavioral / Environmental: -Utilize compassion and acknowledge the patient's experiences while setting clear and realistic expectations for care.    ## Safety and Observation Level:  - Based on my clinical evaluation, I estimate the patient to be at high risk of self harm in the current setting. - At this time, we recommend  1:1 Observation. This decision is based on my review of the chart including patient's history and current presentation, interview of the patient, mental status examination, and consideration of suicide risk including evaluating suicidal ideation, plan, intent, suicidal or self-harm behaviors, risk factors, and protective factors. This judgment is based on our ability to directly address suicide risk, implement suicide prevention strategies, and develop a safety plan while the patient is in the clinical setting. Please contact our team if there is a concern that risk level has changed.  CSSR Risk Category:C-SSRS RISK CATEGORY: No Risk  Suicide Risk Assessment: Patient has following modifiable risk factors for suicide: active suicidal ideation, under treated depression , CAH with suicidal content, active mental illness (to encompass adhd, tbi, mania, psychosis, trauma reaction), and current symptoms: anxiety/panic,  insomnia, impulsivity, anhedonia, hopelessness, which we are addressing by recommending inpatient psychiatric hospitalization. Patient has following non-modifiable or demographic risk factors for suicide: psychiatric  hospitalization Patient has the following protective factors against suicide: Access to outpatient mental health care  Thank you for this consult request. Recommendations have been communicated to the primary team.  We will continue to follow at this time.   Efrain DELENA Patient, NP       History of Present Illness  Relevant Aspects of Hospital ED Course:  Admitted on 03/01/2024 for  brought in by EMS from home with complaints of not feeling right after using some bad cocaine. She carries the psychiatric diagnoses of schizoaffective disorder, paranoid schizophrenia, MDD, bipolar disorder, cannabis abuse and crack cocaine use and has a past medical history of HTN, CVA, Type 2 diabetes and anemia.   Patient Report:   Kaitlyn Gray, is seen face to face by this provider, consulted with Dr. Larina; and chart reviewed on 03/02/24.  On evaluation Kaitlyn Gray reports she doesn't want to live this way anymore and that she wants to die.  She says she can't stop using outside of the hospital and that her life is hopeless.  She is walking with a walker and is mostly incontinent.  She wears briefs. Patient says she is unemployed and does not have disability.  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 She is currently experiencing command auditory and visual hallucinations  telling her to kill herself.  There is strong suspicion for malingering.   During evaluation Kaitlyn Gray is seated on a stretcher in no acute distress.  She is alert & oriented x 4, tearful, cooperative and attentive for this assessment.  Her mood is depressed with congruent affect.  She has normal speech, and behavior.  Objectively there is evidence of psychosis/mania and delusional thinking. Pt does not appear to be responding to internal or external stimuli.  Patient is able to converse coherently, goal directed thoughts, no distractibility, or pre-occupation.  She endorses suicidal/self-harm  ideation, psychosis, and paranoia; she denies homicidal ideation.  Patient answered questions appropriately.    I personally spent a total of 60 minutes in the care of the patient today including preparing to see the patient, getting/reviewing separately obtained history, performing a medically appropriate exam/evaluation, counseling and educating, placing orders, referring and communicating with other health care professionals, documenting clinical information in the EHR, independently interpreting results, and coordinating care.  Psych ROS:  Depression: endorses Anxiety:  endorses Mania (lifetime and current): endorses Psychosis: (lifetime and current): endorses   Review of Systems  Neurological:  Positive for weakness.  Psychiatric/Behavioral:  Positive for depression, hallucinations, substance abuse and suicidal ideas.   All other systems reviewed and are negative.    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 rate  20, SpO2 95%. There is no height or weight on file to calculate BMI.  Physical Exam Vitals and nursing note reviewed.  Eyes:     Pupils: Pupils are equal, round, and reactive to light.  Pulmonary:     Effort: Pulmonary effort is normal.  Skin:    General: Skin is dry.  Neurological:     Mental Status: She is alert and oriented to person, place, and time.     Motor: Weakness present.     Gait: Gait abnormal.  Psychiatric:        Attention and Perception: She perceives auditory and visual hallucinations.        Mood and Affect: Mood is depressed. Affect is flat.        Speech: Speech normal.        Behavior: Behavior is actively hallucinating. Behavior is cooperative.        Thought Content: Thought content includes suicidal ideation.        Cognition and Memory: Cognition and memory normal.        Judgment: Judgment is impulsive.     Mental Status Exam: General Appearance: Disheveled  Orientation:  Full (Time, Place, and Person)  Memory:  Immediate;   Fair Recent;   Fair Remote;   Fair  Concentration:  Concentration: Fair  Recall:  Fair  Attention  Fair  Eye Contact:  Fair  Speech:  Clear and Coherent  Language:  Fair  Volume:  Normal  Mood: depressed  Affect:  Congruent and Tearful  Thought Process:  Goal Directed  Thought Content:  Hallucinations: Auditory Visual  Suicidal Thoughts:  Yes.  with intent/plan  Homicidal Thoughts:  No  Judgement:  Impaired  Insight:  Lacking  Psychomotor Activity:  Decreased and Shuffling Gait  Akathisia:  No  Fund of Knowledge:  Fair      Assets:  Leisure Time  Cognition:  WNL  ADL's:  Impaired  AIMS (if indicated):        Other History   These have been pulled in through the EMR, reviewed, and updated if appropriate.  Family History:  The patient's family history includes CAD (age of onset: 40) in her mother; Hypertension in her father and mother.  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    Encounter for assessment of healthcare decision-making capacity    History of cervical fracture 12/24/2017   nondisplaced fracture lateral mass of C1 on the right on CT 12/24/17   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) 07/19/2017   Hypertension    Hypertensive emergency 05/31/2022   Ischemic stroke (HCC) 01/01/2020   subacute right middle cerebellar peduncle and pons infarction   Left-sided weakness 01/27/2022   Head CT with remote right occipital infarct which is consistent with old left-sided weakness   MDD (major depressive disorder), recurrent severe, 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  Current Outpatient Medications:    ARIPiprazole  (ABILIFY ) 20 MG tablet, Take 1 tablet (20 mg total) by mouth daily for 15 days. (Patient not taking: Reported on 12/28/2023), Disp: 15 tablet, Rfl: 0   ARIPiprazole  ER (ABILIFY  MAINTENA) 400 MG SRER injection, Inject 1.5 mLs (300 mg total) into the muscle every 28 (twenty-eight) days. (Patient not taking: Reported on 12/28/2023), Disp: 2 each, Rfl: 0   ferrous sulfate  325 (65 FE) MG EC tablet, Take 325 mg by mouth daily. (Patient not taking: Reported on 12/13/2023), Disp: , Rfl:    gabapentin  (NEURONTIN ) 100 MG capsule, Take 100 mg by mouth 3 (three) times daily. (Patient not taking: Reported on 12/13/2023), Disp: , Rfl:     losartan  (COZAAR ) 100 MG tablet, Take 100 mg by mouth daily. (Patient not taking: Reported on 12/13/2023), Disp: , Rfl:    metFORMIN  (GLUCOPHAGE ) 500 MG tablet, Take 1 tablet (500 mg total) by mouth 2 (two) times daily with a meal. (Patient not taking: Reported on 12/13/2023), Disp: 60 tablet, Rfl: 0   potassium chloride  SA (KLOR-CON  M) 20 MEQ tablet, Take 1 tablet (20 mEq total) by mouth once for 1 dose., Disp: 3 tablet, Rfl: 0   QUEtiapine  (SEROQUEL ) 25 MG tablet, Take 1 tablet (25 mg total) by mouth at bedtime. (Patient not taking: Reported on 12/28/2023), Disp: 15 tablet, Rfl: 0  Allergies: Allergies  Allergen Reactions   Tomato Anaphylaxis and Other (See Comments)    Pt reports this as an allergy, but has been eating ketchup and pizza on previous visits w/o any s/s of allergic reaction/anaphylaxis.    Hydrocodone  Itching   Latex Itching and Rash    Rodnisha Blomgren A Evelynn Hench, NP

## 2024-03-02 NOTE — ED Notes (Signed)
 Pt got up out of the bed on her own using the walker and started to walk down the hall and stated she was leaving, pt was redirected back to her bed. Pt ambulated with walker unassisted without issue.

## 2024-03-02 NOTE — ED Notes (Signed)
Pt refused everything

## 2024-03-02 NOTE — Progress Notes (Addendum)
 Kaitlyn Gray  MRN: [id number]    Per Efrain Patient, NP, patient recommended for inpatient psychiatric hospitalization when medically cleared. Patient is under voluntary admission status at this time; please IVC if attempts to leave hospital.   Patient accepted to [facility]. Per [facility], patient accepted by Efrain Patient Np to [facility] [id number]. Dr Raliegh is the attending. [phone] is the number for report.

## 2024-03-02 NOTE — ED Notes (Signed)
Voluntary consent signed by pt

## 2024-03-02 NOTE — ED Provider Notes (Signed)
  Provider Note MRN:  [id number]  Arrival date & time: 03/02/24    ED Course and Medical Decision Making  Assumed care of patient at sign-out or upon transfer.  Informed by nursing that patient still very unsteady on her feet.  Feels very weak.  Reassuring workup with normal MRI.  No real indication for admission, will consult TOC for recommendations, have physical therapy see her in the morning.  Sounds like she normally uses a walker and lives alone.  Procedures  Final Clinical Impressions(s) / ED Diagnoses     ICD-10-CM   1. Polysubstance abuse (HCC)  F19.10       ED Discharge Orders     None         Discharge Instructions      Your workup today was reassuring.  You did not suffer a stroke today.  Please stop using cocaine and follow-up with a primary care doctor    Ozell HERO. Theadore, MD [facility] Emergency Medicine [facility] [email or web address]    Theadore Ozell HERO, MD 03/02/24 [phone]

## 2024-03-02 NOTE — Progress Notes (Signed)
 ICM consulted for SNF placement. Pt's insurance is [employer] which is for mental health services and does not have SNF benefits. Pt also is actively using substances and would not qualify for SNF. EDP and PT notified via secure chat. Per chart review, pt uses walker at home and lives alone. No further ICM needs identified at this time. ICM signing off.

## 2024-03-02 NOTE — ED Provider Notes (Signed)
 Emergency Medicine Observation Re-evaluation Note  Kaitlyn Gray is a 48 y.o. female, seen on rounds today.  Pt initially presented to the ED for complaints of Drug Problem Currently, the patient is sleeping but then woke up and had breakfast and was able to ambulate to the bathroom with a walker without assistance.  Physical Exam  BP (!) 150/76   Pulse 73   Temp 98.2 F (36.8 C)   Resp 14   SpO2 95%  Physical Exam General: No acute distress Cardiac: Regular Lungs: Clear Psych: Cooperative and calm  ED Course / MDM  EKG:EKG Interpretation Date/Time:  Tuesday March 01 2024 14:19:37 EDT Ventricular Rate:  57 PR Interval:  140 QRS Duration:  88 QT Interval:  460 QTC Calculation: 448 R Axis:   47  Text Interpretation: Sinus arrhythmia Borderline repolarization abnormality Confirmed by Theadore Sharper [phone]) on 03/02/2024 2:57:25 AM  I have reviewed the labs performed to date as well as medications administered while in observation.  Recent changes in the last 24 hours include was evaluated by social work and patient is not a candidate for skilled placement due to her ongoing substance use.  Physical therapy worked with her she does have a walker at home.  Plan  Current plan is for patient will be discharged home.   10:29 AM After patient worked with PT and was able to get up and move around she then reports that she has been having suicidal thoughts.  Will have psychiatry come speak with the patient to ensure she is psychiatrically cleared before discharge home.   Doretha Folks, MD 03/02/24 [phone]

## 2024-03-02 NOTE — ED Notes (Signed)
 Pt stated she needed to use the bathroom, pt walked to the bathroom with a walker, unassisted, PT and provider notified. PT at bedside for assessment.

## 2024-03-02 NOTE — Progress Notes (Signed)
 Physical Therapy Treatment Patient Details Name: Kaitlyn Gray MRN: [id number] DOB: 1975-12-05 Today's Date: 03/02/2024   History of Present Illness 48 yo  female brought to ED 9/16 after DC 9/15 with complaints of feeling bad. Several ED visits in past, was at [facility] in 5/25.PMH:  hypertension, diabetes, prior CVA, schizoaffective, polysubstance use    PT Comments   Notified by nursing that patient was up ambulating to BR, reported  ambulating with stand by assist using RW. Patient  received in BR with CNA. Assisted with placing briefs, patient standing, multiple balance losses, Patient did  stand  and lift each ;eg to have pants place, leaning posteriorly and required support. Patient unable to self correct,   decreased descent too  sit after a loss of balance.   Patient ambulated x 25'  with Rw and required increased support at end of distance, noted knees flexed and   getting more so with distance. Patient continued to be lethargic and slow responses..  Patient  does not appear to be aroused enough to  safely ambulate independently and negotiate multiple steps. Continue PT.     If plan is discharge home, recommend the following: A lot of help with walking and/or transfers;A lot of help with bathing/dressing/bathroom;Assistance with cooking/housework;Assist for transportation;Help with stairs or ramp for entrance   Can travel by private vehicle        Equipment Recommendations  Rolling walker (2 wheels)    Recommendations for Other Services       Precautions / Restrictions Precautions Precautions: Fall Recall of Precautions/Restrictions: Impaired Restrictions Weight Bearing Restrictions Per Provider Order: No     Mobility  Bed Mobility Overal bed mobility: Needs Assistance Bed Mobility: Sit to Supine     Sit to supine: Contact guard assist   General bed mobility comments: able to place legs onto bed.    Transfers Overall transfer level: Needs assistance Equipment  used: Rolling walker (2 wheels) Transfers: Sit to/from Stand Sit to Stand: Mod assist           General transfer comment: from toilet,   assist to power up.    Ambulation/Gait Ambulation/Gait assistance: Min assist, Mod assist Gait Distance (Feet): 25 Feet Assistive device: Rolling walker (2 wheels) Gait Pattern/deviations: Step-to pattern, Shuffle, Knees buckling, Knee flexed in stance - left, Knee flexed in stance - right Gait velocity: decr Gait velocity interpretation: <1.8 ft/sec, indicate of risk for recurrent falls   General Gait Details: knees flexed, shuffle steps to, frequent support    for balance loss the farther the distance.   Stairs             Wheelchair Mobility     Tilt Bed    Modified Rankin (Stroke Patients Only)       Balance Overall balance assessment: Needs assistance Sitting-balance support: Feet supported, No upper extremity supported Sitting balance-Leahy Scale: Poor Sitting balance - Comments: posterior lean, decreased arousal Postural control: Posterior lean Standing balance support: Reliant on assistive device for balance, During functional activity, Bilateral upper extremity supported Standing balance-Leahy Scale: Poor Standing balance comment: posterior balance loss multiple times while standing to have briefs placed, balance loss when pulling up pants.                            Communication Communication Communication: Impaired Factors Affecting Communication: Difficulty expressing self;Reduced clarity of speech  Cognition Arousal: Lethargic Behavior During Therapy: Flat affect   PT - Cognitive  impairments: No family/caregiver present to determine baseline, Difficult to assess, Orientation, Problem solving, Sequencing, Initiation Difficult to assess due to: Impaired communication Orientation impairments: Time                   PT - Cognition Comments: still not oriented to date, is more aroused  but  sluggish, dysarthric, asking to talk to psychiatrist. RN aware Following commands: Impaired Following commands impaired: Follows one step commands with increased time    Cueing Cueing Techniques: Verbal cues, Tactile cues  Exercises      General Comments        Pertinent Vitals/Pain Pain Assessment Pain Assessment: No/denies pain Breathing: normal Negative Vocalization: none Facial Expression: smiling or inexpressive Body Language: relaxed Consolability: no need to console PAINAD Score: 0    Home Living Family/patient expects to be discharged to:: Private residence Living Arrangements: Alone   Type of Home: Apartment Home Access: Stairs to enter Entrance Stairs-Rails: Right Entrance Stairs-Number of Steps: 6  down Alternate Level Stairs-Number of Steps: 13 Home Layout: Two level;Bed/bath upstairs Home Equipment: None Additional Comments: reports rollator taken by [facility] police and no returned    Prior Function            PT Goals (current goals can now be found in the care plan section) Acute Rehab PT Goals PT Goal Formulation: Patient unable to participate in goal setting Time For Goal Achievement: 03/16/24 Potential to Achieve Goals: Fair Progress towards PT goals: Progressing toward goals    Frequency    Min 3X/week      PT Plan      Co-evaluation              AM-PAC PT 6 Clicks Mobility   Outcome Measure  Help needed turning from your back to your side while in a flat bed without using bedrails?: A Lot Help needed moving from lying on your back to sitting on the side of a flat bed without using bedrails?: A Lot Help needed moving to and from a bed to a chair (including a wheelchair)?: A Lot Help needed standing up from a chair using your arms (e.g., wheelchair or bedside chair)?: A Lot Help needed to walk in hospital room?: A Lot Help needed climbing 3-5 steps with a railing? : Total 6 Click Score: 11    End of Session Equipment  Utilized During Treatment: Gait belt Activity Tolerance: Patient limited by lethargy Patient left: in bed Nurse Communication: Mobility status PT Visit Diagnosis: Unsteadiness on feet (R26.81);Other abnormalities of gait and mobility (R26.89)     Time: 9059-9043 PT Time Calculation (min) (ACUTE ONLY): 16 min  Charges:    $Gait Training: 8-22 mins PT General Charges $$ ACUTE PT VISIT: 1 Visit                    Darice Potters PT Acute Rehabilitation Services Office [phone]   Potters Darice Norris 03/02/2024, 10:04 AM

## 2024-03-03 ENCOUNTER — Inpatient Hospital Stay (HOSPITAL_COMMUNITY)
Admission: AD | Admit: 2024-03-03 | Discharge: 2024-03-09 | DRG: 885 | Disposition: A | Discharge status: Home / Self Care | Payer: MEDICAID | Source: Intra-hospital

## 2024-03-03 ENCOUNTER — Other Ambulatory Visit: Payer: Self-pay

## 2024-03-03 ENCOUNTER — Encounter (HOSPITAL_COMMUNITY): Payer: Self-pay | Admitting: Psychiatry

## 2024-03-03 DIAGNOSIS — I10 Essential (primary) hypertension: Secondary | ICD-10-CM | POA: Diagnosis present

## 2024-03-03 DIAGNOSIS — Z765 Malingerer [conscious simulation]: Secondary | ICD-10-CM

## 2024-03-03 DIAGNOSIS — Z7984 Long term (current) use of oral hypoglycemic drugs: Secondary | ICD-10-CM

## 2024-03-03 DIAGNOSIS — F1410 Cocaine abuse, uncomplicated: Secondary | ICD-10-CM | POA: Diagnosis present

## 2024-03-03 DIAGNOSIS — I69354 Hemiplegia and hemiparesis following cerebral infarction affecting left non-dominant side: Secondary | ICD-10-CM

## 2024-03-03 DIAGNOSIS — F1420 Cocaine dependence, uncomplicated: Secondary | ICD-10-CM | POA: Diagnosis present

## 2024-03-03 DIAGNOSIS — Z5941 Food insecurity: Secondary | ICD-10-CM | POA: Diagnosis not present

## 2024-03-03 DIAGNOSIS — Z885 Allergy status to narcotic agent status: Secondary | ICD-10-CM

## 2024-03-03 DIAGNOSIS — F17210 Nicotine dependence, cigarettes, uncomplicated: Secondary | ICD-10-CM | POA: Diagnosis present

## 2024-03-03 DIAGNOSIS — F259 Schizoaffective disorder, unspecified: Principal | ICD-10-CM | POA: Diagnosis present

## 2024-03-03 DIAGNOSIS — F17290 Nicotine dependence, other tobacco product, uncomplicated: Secondary | ICD-10-CM | POA: Diagnosis present

## 2024-03-03 DIAGNOSIS — F1220 Cannabis dependence, uncomplicated: Secondary | ICD-10-CM | POA: Diagnosis present

## 2024-03-03 DIAGNOSIS — G4700 Insomnia, unspecified: Secondary | ICD-10-CM | POA: Diagnosis present

## 2024-03-03 DIAGNOSIS — F419 Anxiety disorder, unspecified: Secondary | ICD-10-CM | POA: Diagnosis present

## 2024-03-03 DIAGNOSIS — Z91018 Allergy to other foods: Secondary | ICD-10-CM

## 2024-03-03 DIAGNOSIS — R45851 Suicidal ideations: Secondary | ICD-10-CM | POA: Diagnosis present

## 2024-03-03 DIAGNOSIS — Z5912 Inadequate housing utilities: Secondary | ICD-10-CM | POA: Diagnosis not present

## 2024-03-03 DIAGNOSIS — Z91148 Patient's other noncompliance with medication regimen for other reason: Secondary | ICD-10-CM

## 2024-03-03 DIAGNOSIS — F333 Major depressive disorder, recurrent, severe with psychotic symptoms: Secondary | ICD-10-CM | POA: Diagnosis present

## 2024-03-03 DIAGNOSIS — E669 Obesity, unspecified: Secondary | ICD-10-CM | POA: Diagnosis present

## 2024-03-03 DIAGNOSIS — Z8249 Family history of ischemic heart disease and other diseases of the circulatory system: Secondary | ICD-10-CM

## 2024-03-03 DIAGNOSIS — E119 Type 2 diabetes mellitus without complications: Secondary | ICD-10-CM | POA: Diagnosis present

## 2024-03-03 DIAGNOSIS — F1994 Other psychoactive substance use, unspecified with psychoactive substance-induced mood disorder: Principal | ICD-10-CM | POA: Diagnosis present

## 2024-03-03 DIAGNOSIS — I69322 Dysarthria following cerebral infarction: Secondary | ICD-10-CM

## 2024-03-03 DIAGNOSIS — Z5982 Transportation insecurity: Secondary | ICD-10-CM

## 2024-03-03 DIAGNOSIS — F250 Schizoaffective disorder, bipolar type: Principal | ICD-10-CM | POA: Diagnosis present

## 2024-03-03 DIAGNOSIS — F1494 Cocaine use, unspecified with cocaine-induced mood disorder: Secondary | ICD-10-CM | POA: Diagnosis not present

## 2024-03-03 DIAGNOSIS — Z7289 Other problems related to lifestyle: Secondary | ICD-10-CM

## 2024-03-03 DIAGNOSIS — Z79899 Other long term (current) drug therapy: Secondary | ICD-10-CM | POA: Diagnosis not present

## 2024-03-03 DIAGNOSIS — F329 Major depressive disorder, single episode, unspecified: Secondary | ICD-10-CM | POA: Insufficient documentation

## 2024-03-03 DIAGNOSIS — R32 Unspecified urinary incontinence: Secondary | ICD-10-CM | POA: Diagnosis present

## 2024-03-03 DIAGNOSIS — Z91040 Latex allergy status: Secondary | ICD-10-CM

## 2024-03-03 DIAGNOSIS — Z6824 Body mass index (BMI) 24.0-24.9, adult: Secondary | ICD-10-CM | POA: Diagnosis not present

## 2024-03-03 MED ORDER — QUETIAPINE FUMARATE 25 MG PO TABS: 25.0000 mg | ORAL_TABLET | Freq: Every day | ORAL | Status: DC

## 2024-03-03 MED ORDER — CLONIDINE HCL 0.1 MG PO TABS
0.1000 mg | ORAL_TABLET | Freq: Once | ORAL | Status: DC
  Filled 2024-03-03: qty 1

## 2024-03-03 MED ORDER — ALUM & MAG HYDROXIDE-SIMETH 200-200-20 MG/5ML PO SUSP: 30.0000 mL | ORAL | Status: DC | PRN

## 2024-03-03 MED ORDER — HALOPERIDOL 5 MG PO TABS
5.0000 mg | ORAL_TABLET | Freq: Three times a day (TID) | ORAL | Status: DC | PRN
  Administered 2024-03-03 (×2): 5 mg via ORAL
  Filled 2024-03-03 (×3): qty 1

## 2024-03-03 MED ORDER — HALOPERIDOL LACTATE 5 MG/ML IJ SOLN
5.0000 mg | Freq: Three times a day (TID) | INTRAMUSCULAR | Status: DC | PRN
  Administered 2024-03-05: 5 mg via INTRAMUSCULAR
  Filled 2024-03-03: qty 1

## 2024-03-03 MED ORDER — METFORMIN HCL 500 MG PO TABS
500.0000 mg | ORAL_TABLET | Freq: Two times a day (BID) | ORAL | Status: DC
  Administered 2024-03-05 – 2024-03-06 (×3): 500 mg via ORAL
  Filled 2024-03-03 (×8): qty 1

## 2024-03-03 MED ORDER — DIPHENHYDRAMINE HCL 50 MG/ML IJ SOLN
50.0000 mg | Freq: Three times a day (TID) | INTRAMUSCULAR | Status: DC | PRN
  Administered 2024-03-05: 50 mg via INTRAMUSCULAR
  Filled 2024-03-03: qty 1

## 2024-03-03 MED ORDER — LORAZEPAM 2 MG/ML IJ SOLN
2.0000 mg | Freq: Three times a day (TID) | INTRAMUSCULAR | Status: DC | PRN
  Administered 2024-03-05: 2 mg via INTRAMUSCULAR

## 2024-03-03 MED ORDER — DIPHENHYDRAMINE HCL 50 MG/ML IJ SOLN: 50.0000 mg | Freq: Three times a day (TID) | INTRAMUSCULAR | Status: DC | PRN

## 2024-03-03 MED ORDER — ARIPIPRAZOLE 10 MG PO TABS
20.0000 mg | ORAL_TABLET | Freq: Every day | ORAL | Status: DC
  Administered 2024-03-03 – 2024-03-09 (×6): 20 mg via ORAL
  Filled 2024-03-03 (×6): qty 2

## 2024-03-03 MED ORDER — HALOPERIDOL LACTATE 5 MG/ML IJ SOLN: 10.0000 mg | Freq: Three times a day (TID) | INTRAMUSCULAR | Status: DC | PRN

## 2024-03-03 MED ORDER — QUETIAPINE FUMARATE 25 MG PO TABS
25.0000 mg | ORAL_TABLET | Freq: Once | ORAL | Status: AC
  Administered 2024-03-03: 25 mg via ORAL
  Filled 2024-03-03: qty 1

## 2024-03-03 MED ORDER — BENZTROPINE MESYLATE 1 MG PO TABS
1.0000 mg | ORAL_TABLET | Freq: Two times a day (BID) | ORAL | Status: DC
Start: 2024-03-03 — End: 2024-03-09
  Administered 2024-03-03 – 2024-03-09 (×10): 1 mg via ORAL
  Filled 2024-03-03 (×12): qty 1

## 2024-03-03 MED ORDER — ACETAMINOPHEN 325 MG PO TABS
650.0000 mg | ORAL_TABLET | Freq: Four times a day (QID) | ORAL | Status: DC | PRN
  Administered 2024-03-08: 650 mg via ORAL
  Filled 2024-03-03: qty 2

## 2024-03-03 MED ORDER — QUETIAPINE FUMARATE 100 MG PO TABS
100.0000 mg | ORAL_TABLET | Freq: Every day | ORAL | Status: DC
  Administered 2024-03-03 – 2024-03-06 (×4): 100 mg via ORAL
  Filled 2024-03-03 (×4): qty 1

## 2024-03-03 MED ORDER — DIPHENHYDRAMINE HCL 25 MG PO CAPS
50.0000 mg | ORAL_CAPSULE | Freq: Three times a day (TID) | ORAL | Status: DC | PRN
  Administered 2024-03-03 (×2): 50 mg via ORAL
  Filled 2024-03-03 (×3): qty 2

## 2024-03-03 MED ORDER — MAGNESIUM HYDROXIDE 400 MG/5ML PO SUSP
30.0000 mL | Freq: Every day | ORAL | Status: DC | PRN
  Filled 2024-03-03: qty 30

## 2024-03-03 MED ORDER — ENSURE PLUS HIGH PROTEIN PO LIQD
237.0000 mL | Freq: Three times a day (TID) | ORAL | Status: DC
  Administered 2024-03-03 – 2024-03-09 (×16): 237 mL via ORAL
  Filled 2024-03-03 (×20): qty 237

## 2024-03-03 MED ORDER — LORAZEPAM 2 MG/ML IJ SOLN
2.0000 mg | Freq: Three times a day (TID) | INTRAMUSCULAR | Status: DC | PRN
  Filled 2024-03-03: qty 1

## 2024-03-03 NOTE — Progress Notes (Signed)
 Patient states in admission room that the red hoodie clothing that was brought with her isnt hers. She states that she came to [facility] with a green outfit, black and white crocs, and a phone. None of what she claims to own is in our possession, she did not arrive with any of these items. Beryl RN states that he was not able to locate any of these items, but was going to look again. [facility] made aware of the situation.

## 2024-03-03 NOTE — ED Notes (Signed)
 Safe Transport  call to transfer pt to [facility] .

## 2024-03-03 NOTE — Plan of Care (Signed)
  Problem: Activity: Goal: Sleeping patterns will improve Outcome: Progressing   Problem: Education: Goal: Emotional status will improve Outcome: Not Progressing Goal: Mental status will improve Outcome: Not Progressing   

## 2024-03-03 NOTE — Group Note (Deleted)
 Date:  03/03/2024 Time:  8:25 PM  Group Topic/Focus:  Wrap-Up Group:   The focus of this group is to help patients review their daily goal of treatment and discuss progress on daily workbooks.     Participation Level:  {BHH PARTICIPATION OZCZO:77735}  Participation Quality:  {BHH PARTICIPATION QUALITY:22265}  Affect:  {BHH AFFECT:22266}  Cognitive:  {BHH COGNITIVE:22267}  Insight: {BHH Insight2:20797}  Engagement in Group:  {BHH ENGAGEMENT IN HMNLE:77731}  Modes of Intervention:  {BHH MODES OF INTERVENTION:22269}  Additional Comments:  ***  Kaitlyn Gray 03/03/2024, 8:25 PM

## 2024-03-03 NOTE — Progress Notes (Signed)
   03/03/24 0200  Psych Admission Type (Psych Patients Only)  Admission Status Voluntary  Psychosocial Assessment  Patient Complaints Substance abuse  Eye Contact Brief  Facial Expression Flat  Affect Irritable  Speech Slurred (hx of CVA)  Interaction Assertive  Motor Activity Slow;Restless;Unsteady  Appearance/Hygiene Disheveled  Behavior Characteristics Fidgety;Irritable  Mood Labile;Irritable  Thought Process  Coherency WDL  Content WDL  Delusions None reported or observed  Perception Hallucinations  Hallucination Auditory;Visual  Judgment Poor  Confusion None  Danger to Self  Current suicidal ideation? Passive  Self-Injurious Behavior No self-injurious ideation or behavior indicators observed or expressed   Agreement Not to Harm Self Yes  Description of Agreement verbal  Danger to Others  Danger to Others None reported or observed

## 2024-03-03 NOTE — Progress Notes (Signed)
 Initial Treatment Plan 03/03/2024 01:00 AM Pt name: Kaitlyn Gray  MRN # [id number]       PATIENT STRESSORS: Medication noncompliance. Substance use    PATIENT STRENGTHS: Ability to advocate for self        PATIENT IDENTIFIED PROBLEMS: I took a bad drug and I couldn't feel my body   SI   Auditory and visual hallucinations    substance abuse                       DISCHARGE CRITERIA:  Improved stabilization in mood, thinking, and/or behavior. Verbal commitment to aftercare and medication compliance. Ability to meet basic life and health needs. Adequate post-discharge living arrangements. Need for constant or close observation no longer present. Reduction of life-threatening or endangering symptoms to within safe limits. Safe-care adequate arrangements made. Medical problems require only outpatient monitoring. Motivation to continue treatment in a less acute level of care.  PRELIMINARY DISCHARGE PLAN: Return to previous living arrangement. Outpatient therapy.  PATIENT/FAMILY INVOLVEMENT: This treatment plan has been presented to and reviewed with the patient, [name]. The patient have been given the opportunity to ask questions and make suggestions.    [place], [place]  03/03/2024 2:39 AM

## 2024-03-03 NOTE — Progress Notes (Signed)
 Patient received PRN PO agitation protocol per MAR. Patient became irritable and agitated this morning when stating that her belongings were missing. Patient was shouting I want to die and started to tap head against doorway. Patient accepted medication and was cooperative and redirected to her bedroom.

## 2024-03-03 NOTE — Progress Notes (Signed)
 Patient increasingly agitated and banging head on the wall. PO haldol  and benadryl  administered. Safety maintained. Will continue to monitor.

## 2024-03-03 NOTE — Plan of Care (Signed)
  Problem: Education: Goal: Emotional status will improve Outcome: Progressing   Problem: Activity: Goal: Interest or engagement in activities will improve Outcome: Progressing Goal: Sleeping patterns will improve Outcome: Progressing   Problem: Safety: Goal: Periods of time without injury will increase Outcome: Progressing   Problem: Education: Goal: Mental status will improve Outcome: Not Progressing

## 2024-03-03 NOTE — Progress Notes (Signed)
 Patient refused metformin . She states I'm not diabetic.

## 2024-03-03 NOTE — H&P (Signed)
 Psychiatric Admission Assessment Adult  Patient Identification: Kaitlyn Gray  MRN:  [id number]  Date of Evaluation:  03/03/2024  Chief Complaint:  MDD (major depressive disorder) [F32.9]  Principal Diagnosis: Psychoactive substance-induced organic mood disorder (HCC)  Diagnosis:  Principal Problem:   Psychoactive substance-induced organic mood disorder (HCC) Active Problems:   Cocaine use disorder, severe, dependence (HCC)   MDD (major depressive disorder), recurrent, severe, with psychosis (HCC)  History of Present Illness: This is one of several psychiatric admission in this [facility] for this 48 year old AA female with hx of substance use disorders & other mental health issues. Admitted to the [facility] from the [facility] with complaint of passive suicidal ideations without intent, plans or means. Patient was apparently evaluated at the [facility] ED for what she described as I could not move after smoking a bad cocaine). Besides medical evaluation, patient was also evaluated by a physical therapist & was recommended to use walker to aid her ambulation & balance. She was later transferred to the [facility] for psychiatric evaluation/treatments. Patient's chart is reviewed including current lab results. She was admitted/treated/discharged from this [facility] in May of this year, 2025. At the time, she was discharged on Abilify  maintena & recommended to follow-up at the [facility] health for medication management & [facility] for counseling services. It was during this evaluation that patient reports that she has not been on her mental health medications in a long time. During this evaluation, patient reports,   I called the ambulance to take me to the [facility] ED few days ago. I woke-up 2 days ago from sleep & I could not move or walk. I had used me some cocaine prior to going to bed that night. I do not know what happened, but after waking up the next morning, I knew that  something was wrong with my legs. When I got to the hospital, they did check me out, but kept me over night & brought me to this behavioral health hospital. I have mental illness, but I have not been on my mental health medications in a long time. I have been using cocaine every now then to help me feel better. I did feel like hurting myself while at the [place] ED because if I can't walk, it means I can't take care of myself. While I was at the ED, I was seen by a physical therapist who concluded that what I need is a walker to help me move around. I need my mental health addressed while I'm here in this hospital. I need to get back on my Seroquel  which was 100 mg at 12:00 Noon & at bedtime. I'm still feeling suicidal, but I feel safe here. I have heard voices in the last two days except today.  Objective. Patient is seen. She presents with a sad facial expression, fair eye contact & verbally responsive. She is disheveled & malodorous. She does have bladder incontinence problems & currently using depends. Patient has been re-started on Abilify , hopefully transitions to the monthly injectable as chart review indicated that she has benefited from this monthly shot in the past. Patient reports that she lost her personal belongings at the [facility] long hospital ED & hopes to get her belongings back. Her elevated blood pressure is now trending down.  Associated Signs/Symptoms:  Depression Symptoms:  depressed mood, feelings of worthlessness/guilt, hopelessness, suicidal thoughts without plan, anxiety,  (Hypo) Manic Symptoms:  Impulsivity, Labiality of Mood,  Anxiety  Symptoms:  Excessive Worry,  Psychotic Symptoms:  Patient states was hearing voices up until yesterday. However, denies any current AVH, delusional thoughts or paranoia. She does not appear to be responding to any internal stimuli.  PTSD Symptoms: NA  Total Time spent with patient: 1.5 hours  Past Psychiatric History: (Per  chart review): Extensive history of substance use disorder.  Drug of choice is opioids, cocaine and THC.  History of major depressive disorder recurrent and substance-induced psychotic/mood disorder.  Patient has had over half a dozen hospitalizations in the past.  She used to be on Suboxone .  No past inpatient rehab. Patient has multiple self-injurious behavior in context of being intoxicated with psychoactive substances.  She has a history of being violent while under the influence.  She has fleeting psychotic and manic symptoms while under the influence. Patient did well with the Abilify  maintainer.  No recollection of other medicines that she has tried in the past.  Is the patient at risk to self? Yes.    Has the patient been a risk to self in the past 6 months? Yes.    Has the patient been a risk to self within the distant past? Yes.    Is the patient a risk to others? No.  Has the patient been a risk to others in the past 6 months? No.  Has the patient been a risk to others within the distant past? No.   [place] Scale:  Flowsheet Row Ad[facility] ([facility]) [facility] from 03/01/2024 in [facility] at [facility] ED from 02/28/2024 in [facility] at [facility]  C-SSRS RISK CATEGORY No Risk No Risk No Risk   Prior Inpatient Therapy: Yes.   If yes, describe: [facility] in May of 2025.   Prior Outpatient Therapy: Yes.   If yes, describe: [facility] medication [facility] & [facility].   Alcohol Screening: 1. How often do you have a drink containing alcohol?: Never 2. How many drinks containing alcohol do you have on a typical day when you are drinking?: 1 or 2 3. How often do you have six or more drinks on one occasion?: Never AUDIT-C Score: 0 4. How often during the last year have you found that you were not able to stop drinking once you had  started?: Never 5. How often during the last year have you failed to do what was normally expected from you because of drinking?: Never 6. How often during the last year have you needed a first drink in the morning to get yourself going after a heavy drinking session?: Never 7. How often during the last year have you had a feeling of guilt of remorse after drinking?: Never 8. How often during the last year have you been unable to remember what happened the night before because you had been drinking?: Never 9. Have you or someone else been injured as a result of your drinking?: No 10. Has a relative or friend or a doctor or another health worker been concerned about your drinking or suggested you cut down?: No Alcohol Use Disorder Identification Test Final Score (AUDIT): 0 Alcohol Brief Interventions/Follow-up: Patient Refused  Substance Abuse History in the last 12 months:  Yes.    Consequences of Substance Abuse: Discussed with patient during this admission evaluation. Medical Consequences:  Liver damage, Possible death by overdose Legal Consequences:  Arrests, jail time, Loss of driving  privilege. Family Consequences:  Family discord, divorce and or separation.  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) 07/19/2017   Hypertension    Hypertensive emergency 05/31/2022   Ischemic  stroke (HCC) 01/01/2020   subacute right middle cerebellar peduncle and pons infarction   Left-sided weakness 01/27/2022   Head CT with remote right occipital infarct which is consistent with old left-sided weakness   MDD (major depressive disorder), recurrent severe, 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   Normocytic anemia 02/09/2020   Opiate use    History of Suboxone  Therapy until 05/2021   Polysubstance abuse (HCC) 07/19/2017   Prescription drug misadventures (Seroquel )     Past Surgical History:  Procedure Laterality Date   CESAREAN SECTION     Family History:  Family History  Problem Relation Age of Onset   Hypertension Mother    CAD Mother 55       died of MI at age 48   Hypertension Father    Family Psychiatric  History: Patient reports family hx of: Addiction. No family history of suicide.   Tobacco Screening:  Social History   Tobacco Use  Smoking Status Every Day   Current packs/day: 1.00   Average packs/day: 1 pack/day for 25.7 years (25.7 ttl pk-yrs)   Types: Cigarettes   Start date: 06/16/1998   Passive exposure: Current  Smokeless Tobacco Former    BH Tobacco Counseling     Are you interested in Tobacco Cessation Medications?  Yes, implement Nicotene Replacement Protocol Counseled patient on smoking cessation:  Refused/Declined practical counseling Reason Tobacco Screening Not Completed: No value filed.       Social History:  Social History   Substance and Sexual Activity  Alcohol Use Not Currently     Social History   Substance and Sexual Activity  Drug Use Yes   Types: Marijuana, Cocaine, Crack cocaine   Comment: occassionally    Additional Social History:  Allergies:   Allergies  Allergen Reactions   Tomato Anaphylaxis and Other (See Comments)    Pt reports this as an allergy, but has been eating ketchup and pizza on previous visits w/o any s/s of allergic reaction/anaphylaxis.    Hydrocodone  Itching   Latex Itching and Rash   Lab  Results:  Results for orders placed or performed during the hospital encounter of 03/01/24 (from the past 48 hours)  Urine rapid drug screen (hosp performed)     Status: Abnormal   Collection Time: 03/02/24  5:56 AM  Result Value Ref Range   Opiates NEGATIVE NEGATIVE   Cocaine POSITIVE (A) NEGATIVE   Benzodiazepines POSITIVE (A) NEGATIVE   Amphetamines NEGATIVE NEGATIVE   Tetrahydrocannabinol NEGATIVE NEGATIVE   Barbiturates NEGATIVE NEGATIVE   Methadone Scn, Ur NEGATIVE NEGATIVE   Fentanyl NEGATIVE NEGATIVE    Comment: (NOTE) Drug screen is for Medical Purposes only. Positive results are preliminary only. If confirmation is needed, notify  lab within 5 days.  Drug Class                 Cutoff (ng/mL) Amphetamine and metabolites 1000 Barbiturate and metabolites 200 Benzodiazepine              200 Opiates and metabolites     300 Cocaine and metabolites     300 THC                         50 Fentanyl                    5 Methadone                   300  Trazodone  is metabolized in vivo to several metabolites,  including pharmacologically active m-CPP, which is excreted in the  urine.  Immunoassay screens for amphetamines and MDMA have potential  cross-reactivity with these compounds and may provide false positive  result.  Performed at [facility], [address], [place], [place] [address]   Urinalysis, Routine w reflex microscopic -Urine, Clean Catch     Status: Abnormal   Collection Time: 03/02/24  5:56 AM  Result Value Ref Range   Color, Urine YELLOW YELLOW   APPearance HAZY (A) CLEAR   Specific Gravity, Urine 1.025 1.005 - 1.030   pH 5.0 5.0 - 8.0   Glucose, UA NEGATIVE NEGATIVE mg/dL   Hgb urine dipstick LARGE (A) NEGATIVE   Bilirubin Urine NEGATIVE NEGATIVE   Ketones, ur NEGATIVE NEGATIVE mg/dL   Protein, ur NEGATIVE NEGATIVE mg/dL   Nitrite NEGATIVE NEGATIVE   Leukocytes,Ua NEGATIVE NEGATIVE   RBC / HPF >50 0 - 5 RBC/hpf   WBC, UA 0-5 0 -  5 WBC/hpf   Bacteria, UA RARE (A) NONE SEEN   Squamous Epithelial / HPF 0-5 0 - 5 /HPF   Mucus PRESENT    Hyaline Casts, UA PRESENT     Comment: Performed at [facility], [address], [place], [place] [address]   Blood Alcohol level:  Lab Results  Component Value Date   [facility] <15 03/01/2024   ETH <15 02/28/2024   Metabolic Disorder Labs:  Lab Results  Component Value Date   HGBA1C 5.4 10/23/2023   MPG 108.28 10/23/2023   MPG 134.11 12/23/2022   Lab Results  Component Value Date   PROLACTIN 20.7 12/23/2022   Lab Results  Component Value Date   CHOL 123 10/23/2023   TRIG 56 10/23/2023   HDL 48 10/23/2023   CHOLHDL 2.6 10/23/2023   VLDL 11 10/23/2023   LDLCALC 64 10/23/2023   LDLCALC 95 12/23/2022   Current Medications: Current Facility-Administered Medications  Medication Dose Route Frequency Provider Last Rate Last Admin   acetaminophen  (TYLENOL ) tablet 650 mg  650 mg Oral Q6H PRN Weber, Kyra A, NP       alum & mag hydroxide-simeth (MAALOX/MYLANTA) 200-200-20 MG/5ML suspension 30 mL  30 mL Oral Q4H PRN Weber, Kyra A, NP       ARIPiprazole  (ABILIFY ) tablet 20 mg  20 mg Oral Daily Weber, Kyra A, NP   20 mg at 03/03/24 9170   benztropine  (COGENTIN ) tablet 1 mg  1 mg Oral BID Weber, Kyra A, NP   1 mg at 03/03/24 9170   cloNIDine  (CATAPRES ) tablet 0.1 mg  0.1 mg Oral Once Leann Mayweather I, NP       haloperidol  (HALDOL ) tablet 5 mg  5 mg Oral TID PRN Weber, Kyra A, NP   5 mg at 03/03/24 9170   And   diphenhydrAMINE  (BENADRYL ) capsule 50 mg  50 mg Oral TID PRN Weber, Kyra A, NP   50 mg at 03/03/24 9170   haloperidol  lactate (HALDOL ) injection 5 mg  5 mg Intramuscular TID PRN Weber, Kyra A, NP       And   diphenhydrAMINE  (BENADRYL ) injection 50 mg  50 mg Intramuscular TID PRN Weber, Kyra A, NP       And   LORazepam  (ATIVAN ) injection 2 mg  2 mg Intramuscular TID PRN Weber, Kyra A, NP       haloperidol  lactate (HALDOL ) injection 10 mg  10 mg Intramuscular  TID PRN Weber, Kyra A, NP       And   diphenhydrAMINE  (BENADRYL ) injection 50 mg  50 mg Intramuscular TID PRN Weber, Kyra A, NP       And   LORazepam  (ATIVAN ) injection 2 mg  2 mg Intramuscular TID PRN Weber, Kyra A, NP       magnesium  hydroxide (MILK OF MAGNESIA) suspension 30 mL  30 mL Oral Daily PRN Weber, Kyra A, NP       metFORMIN  (GLUCOPHAGE ) tablet 500 mg  500 mg Oral BID WC Pattijo Juste I, NP       QUEtiapine  (SEROQUEL ) tablet 25 mg  25 mg Oral QHS Weber, Kyra A, NP       PTA Medications: No medications prior to admission.   AIMS:  ,  ,  ,  ,  ,  ,    Musculoskeletal: Strength & Muscle Tone: within normal limits Gait & Station: normal Patient leans: N/A  Psychiatric Specialty Exam:  Presentation  General Appearance:  Disheveled (bladder incontinence issues. uses adult depends.)  Eye Contact: Fair  Speech: Clear and Coherent; Normal Rate  Speech Volume: Normal  Handedness: Right   Mood and Affect  Mood: Anxious; Depressed  Affect: Congruent; Flat; Tearful  Thought Process  Thought Processes: Coherent; Goal Directed  Duration of Psychotic Symptoms:Patient reports went on for 3 days, but none today.  Past Diagnosis of Schizophrenia or Psychoactive disorder: Yes  Descriptions of Associations:Intact  Orientation:Full (Time, Place and Person)  Thought Content:Logical  Hallucinations:Hallucinations: None  Ideas of Reference:None  Suicidal Thoughts:Suicidal Thoughts: Yes, Passive SI Passive Intent and/or Plan: Without Intent; Without Plan; Without Means to Carry Out; Without Access to Means  Homicidal Thoughts:Homicidal Thoughts: No   Sensorium  Memory: Immediate Good; Recent Good; Remote Good  Judgment: Fair  Insight: Fair  Executive Functions  Concentration: Good  Attention Span: Good  Recall: Good  Fund of Knowledge: Fair  Language: Good   Psychomotor Activity  Psychomotor Activity:Psychomotor Activity: Decreased;  Other (comment) (Use walker to aid her ambulation & balance.)   Assets  Assets: Desire for Improvement; Communication Skills; Resilience  Sleep  Sleep:Sleep: Fair  Estimated Sleeping Duration (Last 24 Hours): 0.00 hours  Physical Exam: Physical Exam Vitals and nursing note reviewed.  HENT:     Head: Normocephalic.     Nose: Nose normal.  Cardiovascular:     Pulses: Normal pulses.     Comments: Elevated diastolic Blood pressure: 162/88. Pulmonary:     Effort: Pulmonary effort is normal.  Genitourinary:    Comments: Deferred. Musculoskeletal:     Cervical back: Normal range of motion.     Comments: Patient presents with weak gait & poor balance.  Uses the walker to ambulate/balance.  Skin:  General: Skin is dry.  Neurological:     General: No focal deficit present.     Mental Status: She is oriented to person, place, and time.    Review of Systems  Constitutional:  Negative for chills, diaphoresis and fever.  HENT:  Negative for congestion and sore throat.   Respiratory:  Negative for cough, shortness of breath and wheezing.   Cardiovascular:  Negative for chest pain and palpitations.       Elevated blood pressure 162/88.  Patient is in no apparent distress at this time.  Gastrointestinal:  Negative for abdominal pain, constipation, diarrhea, heartburn, nausea and vomiting.  Genitourinary:  Negative for dysuria.  Musculoskeletal:  Negative for joint pain and myalgias.  Neurological:  Positive for weakness (Physical). Negative for dizziness, tingling, tremors, sensory change, speech change, focal weakness, seizures, loss of consciousness and headaches.  Endo/Heme/Allergies:        Allergies: Hydrocodone .   Other: Tomato, Latex.  Psychiatric/Behavioral:  Positive for depression, substance abuse and suicidal ideas (Passive ideations.). Negative for hallucinations and memory loss. The patient is nervous/anxious and has insomnia.    Blood pressure (!) 145/86, pulse  82, temperature 98.7 F (37.1 C), temperature source Oral, resp. rate 12, height 5' 4 (1.626 m), weight 66 kg, SpO2 98%. Body mass index is 24.98 kg/m.  Treatment Plan Summary: Daily contact with patient to assess and evaluate symptoms and progress in treatment and Medication management.   Principal/active diagnoses.  Psychoactive substance-induced organic mood disorder.  Cocaine use disorder.  Other medical issues.  Diabetes mellitus, type-2.  Plan: The risks/benefits/side-effects/alternatives to the medications in use were discussed in detail with the patient and time was given for patient's questions. The patient consents to medication trial.   -Continue Abilify  20 mg po daily for mood control.  -Continue Cogentin  1 mg po bid for EPS prevention.  -Continue Seroquel  25 mg po Q hs for insomnia.  Agitation protocols.  -Continue as recommended. See MAR.   Other medical issues.  -Continue Glucophage  500 mg po bid for DM.  Other PRNS -Continue Tylenol  650 mg every 6 hours PRN for mild pain -Continue Maalox 30 ml Q 4 hrs PRN for indigestion -Continue MOM 30 ml po Q 6 hrs for constipation  Safety and Monitoring: Voluntary admission to inpatient psychiatric unit for safety, stabilization and treatment Daily contact with patient to assess and evaluate symptoms and progress in treatment Patient's case to be discussed in multi-disciplinary team meeting Observation Level : q15 minute checks Vital signs: q12 hours Precautions: Safety  Discharge Planning: Social work and case management to assist with discharge planning and identification of hospital follow-up needs prior to discharge Estimated LOS: 5-7 days Discharge Concerns: Need to establish a safety plan; Medication compliance and effectiveness Discharge Goals: Return home with outpatient referrals for mental health follow-up including medication management/psychotherapy  Observation Level/Precautions:  15 minute checks   Laboratory:  Per ED. Current lab results reviewed.  Psychotherapy:  Enrolled in the group sessions,  Medications: See MAR.  Consultations: As needed.  Discharge Concerns: safety, mood stability.  Estimated LOS: 3-5 days.  Other: NA.   Physician Treatment Plan for Primary Diagnosis: Psychoactive substance-induced organic mood disorder (HCC) Long Term Goal(s): Improvement in symptoms so as ready for discharge  Short Term Goals: Ability to identify changes in lifestyle to reduce recurrence of condition will improve, Ability to verbalize feelings will improve, Ability to disclose and discuss suicidal ideas, and Ability to demonstrate self-control will improve  Physician Treatment Plan for  Secondary Diagnosis: Principal Problem:   Psychoactive substance-induced organic mood disorder (HCC) Active Problems:   Cocaine use disorder, severe, dependence (HCC)   MDD (major depressive disorder), recurrent, severe, with psychosis (HCC)  Long Term Goal(s): Improvement in symptoms so as ready for discharge  Short Term Goals: Ability to identify and develop effective coping behaviors will improve, Ability to maintain clinical measurements within normal limits will improve, Compliance with prescribed medications will improve, and Ability to identify triggers associated with substance abuse/mental health issues will improve  I certify that inpatient services furnished can reasonably be expected to improve the patient's condition.    Mac Bolster, NP, pmhnp, fnp-bc. 9/18/20251:30 PM

## 2024-03-03 NOTE — Progress Notes (Signed)
 Writer informed by adm nurse that upon arrival with safe transport he (adm nurse) was informed to go outside to the car because patient stated she couldn't walk. Before going to the car the Clinical research associate contacted the patient's previous nurse at [facility], who informed the writer the patient was walking without difficulty while in the ED. Informed patient was incontinent, requested supplies and attended her ADLs without assistance. After getting the information the writer walked to the intake area where patient was sitting in the wheelchair. Writer instructed patient to stand for the security scanner. She stated, I can't walk. Writer then informed patient of report she received from her previous nurse, and informed we would also require that she stand and walk on our unit as well. Patient stated, she'd been given some medication and was a little groggy. Writer acknowledged and informed we would assist. Patient wheeled into the search room. Patient urinated on herself while waiting to have her skin assessed. Clean undergarments were given. Patient informed that she sometimes uses a walker or a wheelchair. Writer asked if her if she wanted to use a walker and patient responded yes. Left sided deficients  noted during assessment, however one to one was not deemed appropriate. Order for walker discussed.

## 2024-03-03 NOTE — [facility] Group Notes (Signed)
 Adult Psychoeducational Group Note  Date:  03/03/2024 Time:  9:11 PM  Group Topic/Focus:  Wrap-Up Group:   The focus of this group is to help patients review their daily goal of treatment and discuss progress on daily workbooks.  Participation Level:  Active  Participation Quality:  Appropriate  Affect:  Appropriate  Cognitive:  Appropriate  Insight: Appropriate  Engagement in Group:  Engaged  Modes of Intervention:  Discussion  Additional Comments:  Pt told that today was an okay day on the unit, the highlight of which was making new friends. On the subject of short term goals, Pt mentioned wanting to find out where her clothes and cell phone were, which she might not've brought into the hospital. Pt rated her day a 2 out of 10 due to not knowing where these items are.  Rut Betterton Lee 03/03/2024, 9:11 PM

## 2024-03-03 NOTE — BHH Suicide Risk Assessment (Signed)
 Suicide Risk Assessment  Admission Assessment    [facility] Admission Suicide Risk Assessment   Nursing information obtained from:  Patient  Demographic factors:  Low socioeconomic status, Living alone, Unemployed  Current Mental Status:  Suicidal ideation indicated by patient  Loss Factors:  Financial problems / change in socioeconomic status, Decline in physical health  Historical Factors:  Impulsivity  Risk Reduction Factors:  NA  Total Time spent with patient: 1.5 hours  Principal Problem: MDD (major depressive disorder)  Diagnosis:  Principal Problem:   MDD (major depressive disorder)  Subjective Data: See H&P.  Continued Clinical Symptoms:  Alcohol Use Disorder Identification Test Final Score (AUDIT): 0 The Alcohol Use Disorders Identification Test, Guidelines for Use in Primary Care, Second Edition.  World Science writer [facility]). Score between 0-7:  no or low risk or alcohol related problems. Score between 8-15:  moderate risk of alcohol related problems. Score between 16-19:  high risk of alcohol related problems. Score 20 or above:  warrants further diagnostic evaluation for alcohol dependence and treatment.  CLINICAL FACTORS:   Bipolar Disorder:   Mixed State Alcohol/Substance Abuse/Dependencies More than one psychiatric diagnosis Unstable or Poor Therapeutic Relationship Previous Psychiatric Diagnoses and Treatments Medical Diagnoses and Treatments/Surgeries  Musculoskeletal: Strength & Muscle Tone: within normal limits Gait & Station: normal Patient leans: N/A  Psychiatric Specialty Exam:  Presentation  General Appearance:  Disheveled (bladder incontinence issues. uses adult depends.)  Eye Contact: Fair  Speech: Clear and Coherent; Normal Rate  Speech Volume: Normal  Handedness: Right   Mood and Affect  Mood: Anxious; Depressed  Affect: Congruent; Flat; Tearful   Thought Process  Thought Processes: Coherent; Goal  Directed  Descriptions of Associations:Intact  Orientation:Full (Time, Place and Person)  Thought Content:Logical  History of Schizophrenia/Schizoaffective disorder:Yes  Duration of Psychotic Symptoms:Greater than six months  Hallucinations:Hallucinations: None  Ideas of Reference:None  Suicidal Thoughts:Suicidal Thoughts: Yes, Passive SI Passive Intent and/or Plan: Without Intent; Without Plan; Without Means to Carry Out; Without Access to Means  Homicidal Thoughts:Homicidal Thoughts: No   Sensorium  Memory: Immediate Good; Recent Good; Remote Good  Judgment: Fair  Insight: Fair   Executive Functions  Concentration: Good  Attention Span: Good  Recall: Good  Fund of Knowledge: Fair  Language: Good  Psychomotor Activity  Psychomotor Activity:Psychomotor Activity: Decreased; Other (comment) (Use walker to aid her ambulation & balance.)   Assets  Assets: Desire for Improvement; Communication Skills; Resilience  Sleep  Sleep:Sleep: Fair Number of Hours of Sleep: 5.25   Physical Exam: See H&P  Blood pressure (!) 162/88, pulse 65, temperature 98.7 F (37.1 C), temperature source Oral, resp. rate 12, height 5' 4 (1.626 m), weight 66 kg, SpO2 98%. Body mass index is 24.98 kg/m.  COGNITIVE FEATURES THAT CONTRIBUTE TO RISK:  Thought constriction (tunnel vision)    SUICIDE RISK:   Severe:  Frequent, intense, and enduring suicidal ideation, specific plan, no subjective intent, but some objective markers of intent (i.e., choice of lethal method), the method is accessible, some limited preparatory behavior, evidence of impaired self-control, severe dysphoria/symptomatology, multiple risk factors present, and few if any protective factors, particularly a lack of social support.  PLAN OF CARE: See H&P.  I certify that inpatient services furnished can reasonably be expected to improve the patient's condition.   Mac Bolster, NP, pmhnp, fnp-bc. 03/03/2024,  11:55 AM

## 2024-03-03 NOTE — Progress Notes (Signed)
(  Sleep Hours) - 2.25  (Any PRNs that were needed, meds refused, or side effects to meds)- n/a (Any disturbances and when (visitation, over night)- n/a  (Concerns raised by the patient)- I lost my clothes at the other hospital (SI/HI/AVH)- passive SI/ denies HI/ endorses AVH

## 2024-03-03 NOTE — Progress Notes (Signed)
   03/03/24 0900  Psych Admission Type (Psych Patients Only)  Admission Status Voluntary  Psychosocial Assessment  Patient Complaints Substance abuse;Self-harm thoughts  Eye Contact Brief  Facial Expression Anxious;Angry  Affect Angry;Anxious;Irritable;Preoccupied  Speech Slurred  Interaction Assertive;Demanding  Motor Activity Slow;Restless;Unsteady  Appearance/Hygiene Disheveled  Behavior Characteristics Irritable;Fidgety  Mood Labile;Irritable  Thought Process  Coherency WDL  Content WDL  Delusions None reported or observed  Perception Hallucinations  Hallucination Auditory;Visual  Judgment Poor  Confusion None  Danger to Self  Current suicidal ideation? Passive  Self-Injurious Behavior Some self-injurious ideation observed or expressed.  No lethal plan expressed   Agreement Not to Harm Self Yes  Description of Agreement verbal  Danger to Others  Danger to Others None reported or observed

## 2024-03-03 NOTE — Progress Notes (Signed)
 Admission note:   Patient is a 48 year old [place] American female that presents with SI with no plan. Patient states that she went to the ED because, I took a bad drug, and I could not feel my body. Patient admits the drug she took was cocaine. Patient states, I couldn't do it at home by myself so that is why I needed to come. Patient report passive and intermittent AVH. She is able to contract for safety at this time. Denies HI. Patient states that the voices are command when heard, but cannot remember the visual hallucinations. Patient alert and oriented x4. Patient unsteady when ambulating due to CVA leaving her with left side weakness. Patient given walker at admission. Patient has a disheveled appearance, and irritable at admission. She was made aware of rules and regulations of the unit. Consents signed. Oriented around the unit. Nourishment and hydration given.

## 2024-03-03 NOTE — Plan of Care (Signed)
  Problem: Education: Goal: Knowledge of [place] [facility] Education information/materials will improve Outcome: Progressing Goal: Emotional status will improve Outcome: Progressing Goal: Mental status will improve Outcome: Progressing Goal: Verbalization of understanding the information provided will improve Outcome: Progressing   Problem: Activity: Goal: Interest or engagement in activities will improve Outcome: Progressing   Problem: Coping: Goal: Ability to demonstrate self-control will improve Outcome: Progressing   Problem: Safety: Goal: Periods of time without injury will increase Outcome: Progressing

## 2024-03-03 NOTE — BHH Group Notes (Signed)
 The focus of this group is to help patients establish daily goals to achieve during treatment and discuss how the patient can incorporate goal setting into their daily lives to aide in recovery.          Scale 1-10:  N/A     Goal for the day: Did not attend

## 2024-03-03 NOTE — Progress Notes (Signed)
 Patient walks out of her room yelling, where is my clothes and my phone, everybody keeps telling me that they will look for my things but never do. Bobby RN from [place] states that he is looking for patient's belongings and will call for an update.

## 2024-03-03 NOTE — Progress Notes (Signed)
(  Sleep Hours) -8.75  (Any PRNs that were needed, meds refused, or side effects to meds)- n/a  (Any disturbances and when (visitation, over night)-none  (Concerns raised by the patient)- pt stated she lost her clothes and her phone at the ED before coming to bhh  (SI/HI/AVH)-passive SI , AVH

## 2024-03-04 ENCOUNTER — Encounter (HOSPITAL_COMMUNITY): Payer: Self-pay

## 2024-03-04 MED ORDER — DM-GUAIFENESIN ER 30-600 MG PO TB12
1.0000 | ORAL_TABLET | Freq: Two times a day (BID) | ORAL | Status: DC
  Administered 2024-03-04 – 2024-03-09 (×8): 1 via ORAL
  Filled 2024-03-04 (×8): qty 1

## 2024-03-04 MED ORDER — LOSARTAN POTASSIUM 25 MG PO TABS
25.0000 mg | ORAL_TABLET | Freq: Every day | ORAL | Status: DC
  Administered 2024-03-04 – 2024-03-09 (×6): 25 mg via ORAL
  Filled 2024-03-04 (×6): qty 1

## 2024-03-04 MED ORDER — HYDROXYZINE HCL 50 MG PO TABS
50.0000 mg | ORAL_TABLET | Freq: Three times a day (TID) | ORAL | Status: DC | PRN
  Administered 2024-03-04 – 2024-03-08 (×7): 50 mg via ORAL
  Filled 2024-03-04 (×7): qty 1

## 2024-03-04 NOTE — Progress Notes (Signed)
 [facility] MD Progress Note  03/04/2024 3:58 PM Kaitlyn Gray  MRN:  [id number]  Reason for admission: 48 year old AA female with hx of substance use disorders & other mental health issues. Admitted to the [facility] from the [facility] with complaint of passive suicidal ideations without intent, plans or means. Patient was apparently evaluated at the [facility] ED for what she described as I could not move after smoking a bad cocaine). Besides medical evaluation, patient was also evaluated by a physical therapist & was recommended to use walker to aid her ambulation & balance. She was later transferred to the [facility] for psychiatric evaluation/treatments. Patient's chart is reviewed including current lab results. She was admitted/treated/discharged from this [facility] in May of this year, 2025. At the time, she was discharged on Abilify  maintena & recommended to follow-up at the [facility] health for medication management & [facility] for counseling services.   Daily notes: Kaitlyn Gray is seen this morning. Chart reviewed. The chart findings discussed with the treatment team. She presents alert, oriented & aware of situation. She is visible on the unit, ambulating with the aid of a walker. She reports, I'm doing okay. I just need to find my belongings from the [place] [facility]. All my stuff, phone & everything else is in that bag. Not having my stuff is making me feel depressed. My mood is not good. I slept well last night. I'm hearing voices telling me to go kill myself & I'm seeing people I think were telling me to go kill myself. I'm taking the medicines. I have no side effects. I do go to the dayroom for groups. I just came out of the dayroom few minutes ago. Shortly after this evaluation, patient came to this provider asking for some Seroquel  so she can go to sleep in the afternoon. This request is declined. Patient's blood pressure has been high since her admission. She does have hx of HTN.  She is restarted on Losartan  25 mg po daily for HTN. This provider did inform the [facility] security officer about patient's reported lost belongings at the [place] [facility]. Our security staff did call [place] [facility] security staff & reported patient's lost belongings. However, reports from the [facility] security states that they could not find patient's belongings. Patient is also started on Hydroxyzine  on a prn basis for anxiety. Continue current plan of care as already in progress.  Principal Problem: Psychoactive substance-induced organic mood disorder (HCC)  Diagnosis: Principal Problem:   Psychoactive substance-induced organic mood disorder (HCC) Active Problems:   Cocaine use disorder, severe, dependence (HCC)   MDD (major depressive disorder), recurrent, severe, with psychosis (HCC)  Total Time spent with patient: 45 minutes  Past Psychiatric History: See H&P.  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) 07/19/2017   Hypertension    Hypertensive emergency 05/31/2022   Ischemic stroke (HCC) 01/01/2020   subacute right middle cerebellar peduncle and pons  infarction   Left-sided weakness 01/27/2022   Head CT with remote right occipital infarct which is consistent with old left-sided weakness   MDD (major depressive disorder), recurrent severe, 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   Normocytic anemia 02/09/2020   Opiate use    History of  Suboxone  Therapy until 05/2021   Polysubstance abuse (HCC) 07/19/2017   Prescription drug misadventures (Seroquel )     Past Surgical History:  Procedure Laterality Date   CESAREAN SECTION     Family History:  Family History  Problem Relation Age of Onset   Hypertension Mother    CAD Mother 69       died of MI at age 80   Hypertension Father    Family Psychiatric  History: See H&P.  Social History:  Social History   Substance and Sexual Activity  Alcohol Use Not Currently     Social History   Substance and Sexual Activity  Drug Use Yes   Types: Marijuana, Cocaine, Crack cocaine   Comment: occassionally    Social History   Socioeconomic History   Marital status: Single    Spouse name: Not on file   Number of children: Not on file   Years of education: Not on file   Highest education level: Not on file  Occupational History   Not on file  Tobacco Use   Smoking status: Every Day    Current packs/day: 1.00    Average packs/day: 1 pack/day for 25.7 years (25.7 ttl pk-yrs)    Types: Cigarettes    Start date: 06/16/1998    Passive exposure: Current   Smokeless tobacco: Former  Building services engineer status: Every Day  Substance and Sexual Activity   Alcohol use: Not Currently   Drug use: Yes    Types: Marijuana, Cocaine, Crack cocaine    Comment: occassionally   Sexual activity: Never    Birth control/protection: None  Other Topics Concern   Not on file  Social History Narrative   ** Merged History Encounter **       Social Drivers of Health   Financial Resource Strain: Not on file  Food Insecurity: Food Insecurity Present (03/03/2024)   Hunger Vital Sign    Worried About Running Out of Food in the Last Year: Often true    Ran Out of Food in the Last Year: Often true  Transportation Needs: Unmet Transportation Needs (03/03/2024)   PRAPARE - Administrator, Civil Service (Medical): Yes    Lack of Transportation (Non-Medical): Yes  Physical  Activity: Not on file  Stress: Not on file  Social Connections: Not on file   Additional Social History:   Sleep: Good Estimated Sleeping Duration (Last 24 Hours): 8.50-9.25 hours  Appetite:  Good  Current Medications: Current Facility-Administered Medications  Medication Dose Route Frequency Provider Last Rate Last Admin   acetaminophen  (TYLENOL ) tablet 650 mg  650 mg Oral Q6H PRN Weber, Kyra A, NP       alum & mag hydroxide-simeth (MAALOX/MYLANTA) 200-200-20 MG/5ML suspension 30 mL  30 mL Oral Q4H PRN Weber, Kyra A, NP       ARIPiprazole  (ABILIFY ) tablet 20 mg  20 mg Oral Daily Weber, Kyra A, NP   20 mg at 03/03/24 9170   benztropine  (COGENTIN ) tablet 1 mg  1 mg Oral BID Weber, Kyra A, NP   1 mg at 03/03/24 1628   haloperidol  (HALDOL ) tablet 5 mg  5 mg Oral TID PRN Weber, Kyra A, NP  5 mg at 03/03/24 1343   And   diphenhydrAMINE  (BENADRYL ) capsule 50 mg  50 mg Oral TID PRN Weber, Kyra A, NP   50 mg at 03/03/24 1342   haloperidol  lactate (HALDOL ) injection 5 mg  5 mg Intramuscular TID PRN Weber, Kyra A, NP       And   diphenhydrAMINE  (BENADRYL ) injection 50 mg  50 mg Intramuscular TID PRN Weber, Kyra A, NP       And   LORazepam  (ATIVAN ) injection 2 mg  2 mg Intramuscular TID PRN Weber, Kyra A, NP       haloperidol  lactate (HALDOL ) injection 10 mg  10 mg Intramuscular TID PRN Weber, Kyra A, NP       And   diphenhydrAMINE  (BENADRYL ) injection 50 mg  50 mg Intramuscular TID PRN Weber, Kyra A, NP       And   LORazepam  (ATIVAN ) injection 2 mg  2 mg Intramuscular TID PRN Weber, Kyra A, NP       feeding supplement (ENSURE PLUS HIGH PROTEIN) liquid 237 mL  237 mL Oral TID BM Atley Neubert I, NP   237 mL at 03/04/24 1322   hydrOXYzine  (ATARAX ) tablet 50 mg  50 mg Oral TID PRN Collene Gouge I, NP   50 mg at 03/04/24 1410   losartan  (COZAAR ) tablet 25 mg  25 mg Oral Daily Delmus Warwick I, NP   25 mg at 03/04/24 1536   magnesium  hydroxide (MILK OF MAGNESIA) suspension 30 mL  30 mL Oral Daily  PRN Weber, Kyra A, NP       metFORMIN  (GLUCOPHAGE ) tablet 500 mg  500 mg Oral BID WC Rontrell Moquin I, NP       QUEtiapine  (SEROQUEL ) tablet 100 mg  100 mg Oral QHS Angelle Isais I, NP   100 mg at 03/03/24 2047    Lab Results: No results found for this or any previous visit (from the past 48 hours).  Blood Alcohol level:  Lab Results  Component Value Date   [facility] <15 03/01/2024   ETH <15 02/28/2024    Metabolic Disorder Labs: Lab Results  Component Value Date   HGBA1C 5.4 10/23/2023   MPG 108.28 10/23/2023   MPG 134.11 12/23/2022   Lab Results  Component Value Date   PROLACTIN 20.7 12/23/2022   Lab Results  Component Value Date   CHOL 123 10/23/2023   TRIG 56 10/23/2023   HDL 48 10/23/2023   CHOLHDL 2.6 10/23/2023   VLDL 11 10/23/2023   LDLCALC 64 10/23/2023   LDLCALC 95 12/23/2022   Physical Findings: AIMS:  ,  ,  ,  ,  ,  ,   CIWA:    COWS:     Musculoskeletal: Strength & Muscle Tone: within normal limits Gait & Station: normal Patient leans: N/A  Psychiatric Specialty Exam:  Presentation  General Appearance:  Disheveled (bladder incontinence issues. uses adult depends.)  Eye Contact: Fair  Speech: Clear and Coherent; Normal Rate  Speech Volume: Normal  Handedness: Right   Mood and Affect  Mood: Anxious; Depressed  Affect: Congruent; Flat; Tearful   Thought Process  Thought Processes: Coherent; Goal Directed  Descriptions of Associations:Intact  Orientation:Full (Time, Place and Person)  Thought Content:Logical  History of Schizophrenia/Schizoaffective disorder:Yes  Duration of Psychotic Symptoms:Greater than six months  Hallucinations:Hallucinations: None  Ideas of Reference:None  Suicidal Thoughts:Suicidal Thoughts: Yes, Passive SI Passive Intent and/or Plan: Without Intent; Without Plan; Without Means to Carry Out; Without Access to Means  Homicidal  Thoughts:Homicidal Thoughts: No   Sensorium  Memory: Immediate Good;  Recent Good; Remote Good  Judgment: Fair  Insight: Fair   Executive Functions  Concentration: Good  Attention Span: Good  Recall: Good  Fund of Knowledge: Fair  Language: Good   Psychomotor Activity  Psychomotor Activity: Psychomotor Activity: Decreased; Other (comment) (Use walker to aid her ambulation & balance.)   Assets  Assets: Desire for Improvement; Communication Skills; Resilience   Sleep  Sleep: Sleep: Good Number of Hours of Sleep: 8.5  Physical Exam: Physical Exam Vitals reviewed. Exam conducted with a chaperone present.  HENT:     Head: Normocephalic.     Nose: Nose normal.     Mouth/Throat:     Pharynx: Oropharynx is clear.  Cardiovascular:     Pulses: Normal pulses.     Comments: Elevated blood pressure: 147/75 Pulmonary:     Effort: Pulmonary effort is normal.  Genitourinary:    Comments: Deferred Musculoskeletal:     Comments: Lower extremity weakness.  Patient uses walker for gait/balance support.  Skin:    General: Skin is dry.  Neurological:     General: No focal deficit present.     Mental Status: She is oriented to person, place, and time.    Review of Systems  Constitutional:  Negative for chills, diaphoresis and fever.  HENT:  Negative for congestion and sore throat.   Respiratory:  Negative for cough, shortness of breath and wheezing.   Cardiovascular:  Negative for chest pain and palpitations.       Elevated blood pressure: 147/75  Gastrointestinal:  Negative for abdominal pain, constipation, diarrhea, heartburn, nausea and vomiting.  Genitourinary:        Suffering from bladder incontinence.  Use adult depends.  Musculoskeletal:        Lower extremity weakness.  Patient uses walker for gait/balance support.   Skin:  Negative for rash.  Neurological:  Positive for weakness (Lower extremity.). Negative for dizziness, tingling, tremors, sensory change, speech change, focal weakness, seizures, loss of consciousness  and headaches.  Endo/Heme/Allergies:        Allergies: Hydrocodone .  Other: Tomato, Latex.  Psychiatric/Behavioral:  Positive for depression, hallucinations and substance abuse. Negative for memory loss. The patient is nervous/anxious and has insomnia.    Blood pressure (!) 168/89, pulse 78, temperature 97.7 F (36.5 C), temperature source Oral, resp. rate 12, height 5' 4 (1.626 m), weight 66 kg, SpO2 100%. Body mass index is 24.98 kg/m.  Treatment Plan Summary: Daily contact with patient to assess and evaluate symptoms and progress in treatment and Medication management.   Principal/active diagnoses.  Psychoactive substance-induced organic mood disorder.  Cocaine use disorder.   Other medical issues.  Diabetes mellitus, type-2.  Plan: The risks/benefits/side-effects/alternatives to the medications in use were discussed in detail with the patient and time was given for patient's questions. The patient consents to medication trial.    -Continue Abilify  20 mg po daily for mood control.  -Continue Cogentin  1 mg po bid for EPS prevention.  -Continue Seroquel  100 mg po Q hs for insomnia. -Initiated Hydroxyzine  50 mg po tid prn for anxiety.   Agitation protocols.  -Continue as recommended. See MAR.    Other medical issues.  -Continue Glucophage  500 mg po bid for DM.  -Initiated Losartan  25 mg po daily for HTN.   Other PRNS -Continue Tylenol  650 mg every 6 hours PRN for mild pain -Continue Maalox 30 ml Q 4 hrs PRN for indigestion -Continue MOM 30 ml po  Q 6 hrs for constipation   Safety and Monitoring: Voluntary admission to inpatient psychiatric unit for safety, stabilization and treatment Daily contact with patient to assess and evaluate symptoms and progress in treatment Patient's case to be discussed in multi-disciplinary team meeting Observation Level : q15 minute checks Vital signs: q12 hours Precautions: Safety   Discharge Planning: Social work and case management to  assist with discharge planning and identification of hospital follow-up needs prior to discharge Estimated LOS: 5-7 days Discharge Concerns: Need to establish a safety plan; Medication compliance and effectiveness Discharge Goals: Return home with outpatient referrals for mental health follow-up including medication management/psychotherapy  Mac Bolster, NP, pmhnp, fnp-bc. 03/04/2024, 3:58 PM

## 2024-03-04 NOTE — Progress Notes (Signed)
 Pt refusing all morning scheduled medications. Pt states  I do not want to take meds anymore. Importance of med compliance explained to pt. Hygiene care offered to pt to include getting changed and assistance to the bathroom. Pt declines assistance.

## 2024-03-04 NOTE — Progress Notes (Signed)
 Pt assisted to the bathroom and assisted with changing scrubs.

## 2024-03-04 NOTE — BHH Group Notes (Signed)
 Adult Psychoeducational Group Note  Date:  03/04/2024 Time:  7:44 PM  Group Topic/Focus:  Goals Group:   The focus of this group is to help patients establish daily goals to achieve during treatment and discuss how the patient can incorporate goal setting into their daily lives to aide in recovery. Orientation:   The focus of this group is to educate the patient on the purpose and policies of crisis stabilization and provide a format to answer questions about their admission.  The group details unit policies and expectations of patients while admitted.  Participation Level:  Did Not Attend  Participation Quality:    Affect:    Cognitive:    Insight:   Engagement in Group:    Modes of Intervention:    Additional Comments:    Jamelle Cassondra KIDD 03/04/2024, 7:44 PM

## 2024-03-04 NOTE — Group Note (Signed)
 Recreation Therapy Group Note   Group Topic:Coping Skills  Group Date: 03/04/2024 Start Time: 1020 End Time: 1046 Facilitators: Amorah Sebring-McCall, LRT,CTRS Location: [address] Dayroom   Group Topic: Coping Skills   Goal Area(s) Addresses: Patient will define what a coping skill is. Patient will work to create a list of healthy coping skills beginning with each letter of the alphabet. Patient will successfully identify positive coping skills they can use post d/c.  Patient will acknowledge benefit(s) of using learned coping skills post d/c.  Behavioral Response: Minimal   Intervention: Group work   Activity: Coping A to Z. Patient asked to identify what a coping skill is and when they use them. Patients with Clinical research associate discussed healthy versus unhealthy coping skills. Next patients were given a blank worksheet titled Coping Skills A-Z. Partners were instructed to come up with at least one positive coping skill per letter of the alphabet. Patients were given 15 minutes to brainstorm before ideas were presented to the large group. Patients and LRT debriefed on the importance of coping skill selection based on situation and back-up plans when a skill tried is not effective. At the end of group, patients were given an handout of alphabetized strategies to keep for future reference.   Education: Pharmacologist, Scientist, physiological, Discharge Planning.    Education Outcome: Acknowledges education/Verbalizes understanding/In group clarification offered/Additional education needed   Affect/Mood: Appropriate   Participation Level: Minimal   Participation Quality: Independent   Behavior: Attentive    Speech/Thought Process: Relevant   Insight: Fair   Judgement: Fair    Modes of Intervention: STEM Activity   Patient Response to Interventions:  Attentive   Education Outcome:  In group clarification offered    Clinical Observations/Individualized Feedback: Pt came in late to group.  Pt was focused on eating her snack. Pt was able to identify some of her coping skills. Pt identified jogging, resting, partying, hiking, fishing and stimulating.    Plan: Continue to engage patient in RT group sessions 2-3x/week.   [name], LRT,CTRS 03/04/2024 1:06 PM

## 2024-03-04 NOTE — Progress Notes (Signed)
   03/04/24 1538  Psych Admission Type (Psych Patients Only)  Admission Status Voluntary  Psychosocial Assessment  Patient Complaints None  Eye Contact Fair  Facial Expression Flat  Affect Appropriate to circumstance  Speech Logical/coherent  Interaction Assertive  Motor Activity Slow  Appearance/Hygiene Improved;In scrubs;Other (Comment) (pt assisted with hygiene care to include shower, linen change and clothes changed)  Behavior Characteristics Cooperative  Mood Pleasant  Thought Process  Coherency WDL  Content WDL  Delusions None reported or observed  Perception WDL  Hallucination None reported or observed  Judgment Limited  Confusion None  Danger to Self  Current suicidal ideation? Denies  Agreement Not to Harm Self Yes  Description of Agreement verbal  Danger to Others  Danger to Others None reported or observed

## 2024-03-04 NOTE — BHH Group Notes (Signed)
 Spirituality Group   Description: Participant directed exploration of values, beliefs and meaning   Following a brief framework of chaplain's role and ground rules of group behavior, participants are invited to share concerns or questions that engage spiritual life. Emphasis placed on common themes and shared experiences and ways to make meaning and clarify living into one's values.   Theory/Process/Goal: Utilize the theoretical framework of group therapy established by Celena Kite, Relational Cultural Theory and Rogerian approaches to facilitate relational empathy and use of the "here and now" to foster reflection, self-awareness, and sharing.   Observations: Kaitlyn Gray was present and engaged in the group discussion. She was somewhat emotionally labile when sharing about her daughter.  Kaitlyn Gray L. Kaitlyn Gray HERO.Div
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
## 2024-03-04 NOTE — Group Note (Signed)
 Date:  03/04/2024 Time:  8:34 PM  Group Topic/Focus:  Wrap-Up Group:   The focus of this group is to help patients review their daily goal of treatment and discuss progress on daily workbooks.    Participation Level:  Did Not Attend   Jed Kutch Dacosta 03/04/2024, 8:34 PM

## 2024-03-04 NOTE — BH IP Treatment Plan (Signed)
 Interdisciplinary Treatment and Diagnostic Plan Update  03/04/2024 Time of Session: 11:00 AM Kaitlyn Gray MRN: [id number]  Principal Diagnosis: Psychoactive substance-induced organic mood disorder (HCC)  Secondary Diagnoses: Principal Problem:   Psychoactive substance-induced organic mood disorder (HCC) Active Problems:   Cocaine use disorder, severe, dependence (HCC)   MDD (major depressive disorder), recurrent, severe, with psychosis (HCC)   Current Medications:  Current Facility-Administered Medications  Medication Dose Route Frequency Provider Last Rate Last Admin   acetaminophen  (TYLENOL ) tablet 650 mg  650 mg Oral Q6H PRN Weber, Kyra A, NP       alum & mag hydroxide-simeth (MAALOX/MYLANTA) 200-200-20 MG/5ML suspension 30 mL  30 mL Oral Q4H PRN Weber, Kyra A, NP       ARIPiprazole  (ABILIFY ) tablet 20 mg  20 mg Oral Daily Weber, Kyra A, NP   20 mg at 03/03/24 9170   benztropine  (COGENTIN ) tablet 1 mg  1 mg Oral BID Weber, Kyra A, NP   1 mg at 03/03/24 1628   dextromethorphan -guaiFENesin  (MUCINEX  DM) 30-600 MG per 12 hr tablet 1 tablet  1 tablet Oral BID Prentis Kitchens A, DO   1 tablet at 03/04/24 1652   haloperidol  (HALDOL ) tablet 5 mg  5 mg Oral TID PRN Weber, Kyra A, NP   5 mg at 03/03/24 1343   And   diphenhydrAMINE  (BENADRYL ) capsule 50 mg  50 mg Oral TID PRN Weber, Kyra A, NP   50 mg at 03/03/24 1342   haloperidol  lactate (HALDOL ) injection 5 mg  5 mg Intramuscular TID PRN Weber, Kyra A, NP       And   diphenhydrAMINE  (BENADRYL ) injection 50 mg  50 mg Intramuscular TID PRN Weber, Kyra A, NP       And   LORazepam  (ATIVAN ) injection 2 mg  2 mg Intramuscular TID PRN Weber, Kyra A, NP       haloperidol  lactate (HALDOL ) injection 10 mg  10 mg Intramuscular TID PRN Weber, Kyra A, NP       And   diphenhydrAMINE  (BENADRYL ) injection 50 mg  50 mg Intramuscular TID PRN Weber, Kyra A, NP       And   LORazepam  (ATIVAN ) injection 2 mg  2 mg Intramuscular TID PRN Weber, Kyra A, NP        feeding supplement (ENSURE PLUS HIGH PROTEIN) liquid 237 mL  237 mL Oral TID BM Nwoko, Agnes I, NP   237 mL at 03/04/24 1322   hydrOXYzine  (ATARAX ) tablet 50 mg  50 mg Oral TID PRN Collene Gouge I, NP   50 mg at 03/04/24 1410   losartan  (COZAAR ) tablet 25 mg  25 mg Oral Daily Nwoko, Gouge I, NP   25 mg at 03/04/24 1536   magnesium  hydroxide (MILK OF MAGNESIA) suspension 30 mL  30 mL Oral Daily PRN Weber, Kyra A, NP       metFORMIN  (GLUCOPHAGE ) tablet 500 mg  500 mg Oral BID WC Nwoko, Agnes I, NP       QUEtiapine  (SEROQUEL ) tablet 100 mg  100 mg Oral QHS Nwoko, Agnes I, NP   100 mg at 03/03/24 2047   PTA Medications: No medications prior to admission.    Patient Stressors:    Patient Strengths:    Treatment Modalities: Medication Management, Group therapy, Case management,  1 to 1 session with clinician, Psychoeducation, Recreational therapy.   Physician Treatment Plan for Primary Diagnosis: Psychoactive substance-induced organic mood disorder (HCC) Long Term Goal(s): Improvement in symptoms so as ready for  discharge   Short Term Goals: Ability to identify and develop effective coping behaviors will improve Ability to maintain clinical measurements within normal limits will improve Compliance with prescribed medications will improve Ability to identify triggers associated with substance abuse/mental health issues will improve Ability to identify changes in lifestyle to reduce recurrence of condition will improve Ability to verbalize feelings will improve Ability to disclose and discuss suicidal ideas Ability to demonstrate self-control will improve  Medication Management: Evaluate patient's response, side effects, and tolerance of medication regimen.  Therapeutic Interventions: 1 to 1 sessions, Unit Group sessions and Medication administration.  Evaluation of Outcomes: Not Progressing  Physician Treatment Plan for Secondary Diagnosis: Principal Problem:   Psychoactive  substance-induced organic mood disorder (HCC) Active Problems:   Cocaine use disorder, severe, dependence (HCC)   MDD (major depressive disorder), recurrent, severe, with psychosis (HCC)  Long Term Goal(s): Improvement in symptoms so as ready for discharge   Short Term Goals: Ability to identify and develop effective coping behaviors will improve Ability to maintain clinical measurements within normal limits will improve Compliance with prescribed medications will improve Ability to identify triggers associated with substance abuse/mental health issues will improve Ability to identify changes in lifestyle to reduce recurrence of condition will improve Ability to verbalize feelings will improve Ability to disclose and discuss suicidal ideas Ability to demonstrate self-control will improve     Medication Management: Evaluate patient's response, side effects, and tolerance of medication regimen.  Therapeutic Interventions: 1 to 1 sessions, Unit Group sessions and Medication administration.  Evaluation of Outcomes: Not Progressing   RN Treatment Plan for Primary Diagnosis: Psychoactive substance-induced organic mood disorder (HCC) Long Term Goal(s): Knowledge of disease and therapeutic regimen to maintain health will improve  Short Term Goals: Ability to remain free from injury will improve, Ability to verbalize frustration and anger appropriately will improve, Ability to demonstrate self-control, Ability to participate in decision making will improve, Ability to verbalize feelings will improve, Ability to disclose and discuss suicidal ideas, Ability to identify and develop effective coping behaviors will improve, and Compliance with prescribed medications will improve  Medication Management: RN will administer medications as ordered by provider, will assess and evaluate patient's response and provide education to patient for prescribed medication. RN will report any adverse and/or side  effects to prescribing provider.  Therapeutic Interventions: 1 on 1 counseling sessions, Psychoeducation, Medication administration, Evaluate responses to treatment, Monitor vital signs and CBGs as ordered, Perform/monitor CIWA, COWS, AIMS and Fall Risk screenings as ordered, Perform wound care treatments as ordered.  Evaluation of Outcomes: Not Progressing   LCSW Treatment Plan for Primary Diagnosis: Psychoactive substance-induced organic mood disorder (HCC) Long Term Goal(s): Safe transition to appropriate next level of care at discharge, Engage patient in therapeutic group addressing interpersonal concerns.  Short Term Goals: Engage patient in aftercare planning with referrals and resources, Increase social support, Increase ability to appropriately verbalize feelings, Increase emotional regulation, Facilitate acceptance of mental health diagnosis and concerns, Facilitate patient progression through stages of change regarding substance use diagnoses and concerns, Identify triggers associated with mental health/substance abuse issues, and Increase skills for wellness and recovery  Therapeutic Interventions: Assess for all discharge needs, 1 to 1 time with Social worker, Explore available resources and support systems, Assess for adequacy in community support network, Educate family and significant other(s) on suicide prevention, Complete Psychosocial Assessment, Interpersonal group therapy.  Evaluation of Outcomes: Not Progressing   Progress in Treatment: Attending groups: Yes. Participating in groups: Yes. Taking medication as  prescribed: Yes. Toleration medication: Yes. Family/Significant other contact made: Patient declined consents Patient understands diagnosis: No. Discussing patient identified problems/goals with staff: No. Medical problems stabilized or resolved: Yes. Denies suicidal/homicidal ideation: Yes. Issues/concerns per patient self-inventory: No.   New problem(s)  identified:  No  New Short Term/Long Term Goal(s):    medication stabilization, elimination of SI thoughts, development of comprehensive mental wellness plan.    Patient Goals:  My phone, clothes and Crocks were left at [facility].     Discharge Plan or Barriers:  Patient recently admitted. CSW will continue to follow and assess for appropriate referrals and possible discharge planning.    Reason for Continuation of Hospitalization: Depression Medication stabilization Suicidal ideation  Estimated Length of Stay:  5 - 7 days  Last 3 [place] Suicide Severity Risk Score: Flowsheet Row [facility] (Current[facility] from 03[facility]01/2024 in [facility] Department at [facility] ED from 02/28/2024 in [facility] Department at [facility]  C-SSRS RISK CATEGORY No Risk No Risk No Risk    Last PHQ 2/9 Scores:    07/17/2022    1:34 PM 12/12/2021    5:37 PM 07/15/2017    7:12 PM  Depression screen PHQ 2/9  Decreased Interest  1 2  Down, Depressed, Hopeless 2 1 2   PHQ - 2 Score 2 2 4   Altered sleeping 3 2 1   Tired, decreased energy 3 2 1   Change in appetite 3 1 1   Feeling bad or failure about yourself  3 2 1   Trouble concentrating 3 1 1   Moving slowly or fidgety/restless 3 1 0  Suicidal thoughts 1 1 1   PHQ-9 Score 21 12 10   Difficult doing work/chores Extremely dIfficult Somewhat difficult Somewhat difficult    Scribe for Treatment Team: Ladislaus Repsher O Bharat Antillon, LCSWA 03/04/2024 7:02 PM

## 2024-03-04 NOTE — Progress Notes (Signed)
   03/04/24 2300  Psych Admission Type (Psych Patients Only)  Admission Status Voluntary  Psychosocial Assessment  Patient Complaints None  Eye Contact Fair  Facial Expression Flat  Affect Appropriate to circumstance  Speech Slurred (hx ischemic stroke)  Interaction Assertive  Motor Activity Slow  Appearance/Hygiene Unremarkable;In scrubs  Behavior Characteristics Cooperative  Mood Pleasant  Aggressive Behavior  Effect No apparent injury  Thought Process  Coherency WDL  Content WDL  Delusions None reported or observed  Perception WDL  Hallucination None reported or observed  Judgment Limited  Confusion None  Danger to Self  Current suicidal ideation? Passive  Agreement Not to Harm Self Yes  Description of Agreement verbal  Danger to Others  Danger to Others None reported or observed

## 2024-03-04 NOTE — Progress Notes (Signed)
 Recreation Therapy Notes  INPATIENT RECREATION THERAPY ASSESSMENT  Patient Details Name: Kaitlyn Gray MRN: [id number] DOB: 08/09/1975 Today's Date: 03/04/2024       Information Obtained From: Patient  Able to Participate in Assessment/Interview: Yes  Patient Presentation: Alert  Reason for Admission (Per Patient): Other (Comments) (hearing voices)  Patient Stressors: Other (Comment) (Pt stated her missing clothes and phone were stressing her out.)  Coping Skills:   Music, Deep Breathing, Meditate, Prayer, Avoidance, Read  Leisure Interests (2+):  Exercise - Walking, Individual - Other (Comment) (Thinking)  Frequency of Recreation/Participation: Other (Comment) (when I need to/when something bothers me)  Awareness of Community Resources:  Yes  Community Resources:  [place], Research scientist (physical sciences), Other (Comment) [facility])  Current Use: Yes  If no, Barriers?:    Expressed Interest in [employer] Information: No  [employer] of Residence:  Engineer, technical sales  Patient Main Form of Transportation: Therapist, music  Patient Strengths:  Loving self, Loving her children  Patient Identified Areas of Improvement:  not really  Patient Goal for Hospitalization:  to get on medication and learn somethings like how to cope. Sometimes I know how to cope, sometimes I don't  Current SI (including self-harm):  No  Current HI:  No  Current AVH: Yes (see people talking negative to her)  Staff Intervention Plan: Group Attendance, Collaborate with Interdisciplinary Treatment Team  Consent to Intern Participation: N/A   Nyeemah Jennette-McCall, LRT,CTRS Lloyd Cullinan A Breslin Hemann-McCall 03/04/2024, 1:34 PM

## 2024-03-04 NOTE — BHH Counselor (Signed)
 CSW attempted to meet with patient for completion of PSA, however patient was asleep and unable to be awakened. CSW to attempt at a later time.  Inaki Vantine, LCSWA 03/04/24 10:19am

## 2024-03-04 NOTE — Plan of Care (Signed)

## 2024-03-04 NOTE — Progress Notes (Signed)
 Oliva Salmon, MD made aware of pt med refusal

## 2024-03-04 NOTE — Progress Notes (Signed)
Pt assisted with shower.  Bed linens changed.

## 2024-03-05 MED ORDER — QUETIAPINE FUMARATE 25 MG PO TABS
25.0000 mg | ORAL_TABLET | Freq: Two times a day (BID) | ORAL | Status: DC
  Filled 2024-03-05: qty 1

## 2024-03-05 MED ORDER — QUETIAPINE FUMARATE 25 MG PO TABS
25.0000 mg | ORAL_TABLET | Freq: Two times a day (BID) | ORAL | Status: DC
  Administered 2024-03-05 – 2024-03-07 (×4): 25 mg via ORAL
  Filled 2024-03-05 (×3): qty 1

## 2024-03-05 NOTE — Progress Notes (Signed)
 [facility] MD Progress Note  03/05/2024 5:11 PM Kaitlyn Gray  MRN:  [id number]  Reason for admission: 48 year old AA female with hx of substance use disorders & other mental health issues. Admitted to the [facility] from the [facility] with complaint of passive suicidal ideations without intent, plans or means. Patient was apparently evaluated at the [facility] ED for what she described as I could not move after smoking a bad cocaine). Besides medical evaluation, patient was also evaluated by a physical therapist & was recommended to use walker to aid her ambulation & balance. She was later transferred to the [facility] for psychiatric evaluation/treatments. Patient's chart is reviewed including current lab results. She was admitted/treated/discharged from this [facility] in May of this year, 2025. At the time, she was discharged on Abilify  maintena & recommended to follow-up at the [facility] for medication management & [facility] for counseling services.   Daily notes: [name] is seen this morning. Chart reviewed. The chart findings discussed with the treatment team. She presents alert, oriented & aware of situation. She is visible on the unit, ambulating with the aid of a walker. She is attending group sessions. She presents with a bright affect, good eye contact & verbally responsive. She reports, My mood is alright. I have had my birth already & I have a new scrub on. It is a good morning so far. I slept well last last night. However, soon after lunch, patient complained that she has never benefited from Hydroxyzine  for her anxiety in the past. She says she prefers Seroquel . Patient has been showing medication seeking behaviors towards Seroquel  since her admission. She declares that a low dose of Seroquel  in the afternoon does help her a lot.  Patient was also started on Hydroxyzine  50 mg on a prn basis for anxiety yesterday. She was also started on Losartan  for elevated blood pressure. Her  vital signs is showing good improvement. She is started on Seroquel  25 mg bid for agitation x 3 days. Continue current plan of care as already in progress.  Principal Problem: Psychoactive substance-induced organic mood disorder (HCC)  Diagnosis: Principal Problem:   Psychoactive substance-induced organic mood disorder (HCC) Active Problems:   Cocaine use disorder, severe, dependence (HCC)   MDD (major depressive disorder), recurrent, severe, with psychosis (HCC)  Total Time spent with patient: 45 minutes  Past Psychiatric History: See H&P.  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) 07/19/2017   Hypertension    Hypertensive emergency 05/31/2022   Ischemic stroke (HCC) 01/01/2020   subacute right middle cerebellar peduncle and pons infarction   Left-sided weakness 01/27/2022   Head CT with remote right occipital infarct which is consistent with old left-sided weakness   MDD (major depressive disorder), recurrent severe, 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   Normocytic anemia 02/09/2020   Opiate use    History of Suboxone  Therapy until 05/2021   Polysubstance abuse (HCC) 07/19/2017   Prescription  drug misadventures (Seroquel )     Past Surgical History:  Procedure Laterality Date   CESAREAN SECTION     Family History:  Family History  Problem Relation Age of Onset   Hypertension Mother    CAD Mother 21       died of MI at age 66    Hypertension Father    Family Psychiatric  History: See H&P.  Social History:  Social History   Substance and Sexual Activity  Alcohol Use Not Currently     Social History   Substance and Sexual Activity  Drug Use Yes   Types: Marijuana, Cocaine, Crack cocaine   Comment: occassionally    Social History   Socioeconomic History   Marital status: Single    Spouse name: Not on file   Number of children: Not on file   Years of education: Not on file   Highest education level: Not on file  Occupational History   Not on file  Tobacco Use   Smoking status: Every Day    Current packs/day: 1.00    Average packs/day: 1 pack/day for 25.7 years (25.7 ttl pk-yrs)    Types: Cigarettes    Start date: 06/16/1998    Passive exposure: Current   Smokeless tobacco: Former  Building services engineer status: Every Day  Substance and Sexual Activity   Alcohol use: Not Currently   Drug use: Yes    Types: Marijuana, Cocaine, Crack cocaine    Comment: occassionally   Sexual activity: Never    Birth control/protection: None  Other Topics Concern   Not on file  Social History Narrative   ** Merged History Encounter **       Social Drivers of Health   Financial Resource Strain: Not on file  Food Insecurity: Food Insecurity Present (03/03/2024)   Hunger Vital Sign    Worried About Running Out of Food in the Last Year: Often true    Ran Out of Food in the Last Year: Often true  Transportation Needs: Unmet Transportation Needs (03/03/2024)   PRAPARE - Administrator, Civil Service (Medical): Yes    Lack of Transportation (Non-Medical): Yes  Physical Activity: Not on file  Stress: Not on file  Social Connections: Not on file   Additional Social History:   Sleep: Good Estimated Sleeping Duration (Last 24 Hours): 6.50-7.50 hours  Appetite:  Good  Current Medications: Current Facility-Administered Medications  Medication Dose Route Frequency Provider Last Rate Last Admin    acetaminophen  (TYLENOL ) tablet 650 mg  650 mg Oral Q6H PRN Weber, Kyra A, NP       alum & mag hydroxide-simeth (MAALOX/MYLANTA) 200-200-20 MG/5ML suspension 30 mL  30 mL Oral Q4H PRN Weber, Kyra A, NP       ARIPiprazole  (ABILIFY ) tablet 20 mg  20 mg Oral Daily Weber, Kyra A, NP   20 mg at 03/05/24 0900   benztropine  (COGENTIN ) tablet 1 mg  1 mg Oral BID Weber, Kyra A, NP   1 mg at 03/05/24 0900   dextromethorphan -guaiFENesin  (MUCINEX  DM) 30-600 MG per 12 hr tablet 1 tablet  1 tablet Oral BID Prentis Kitchens A, DO   1 tablet at 03/05/24 0900   haloperidol  (HALDOL ) tablet 5 mg  5 mg Oral TID PRN Weber, Kyra A, NP   5 mg at 03/03/24 1343   And   diphenhydrAMINE  (BENADRYL ) capsule 50 mg  50 mg Oral TID PRN Weber, Kyra A, NP   50 mg at 03/03/24 1342  haloperidol  lactate (HALDOL ) injection 5 mg  5 mg Intramuscular TID PRN Weber, Kyra A, NP       And   diphenhydrAMINE  (BENADRYL ) injection 50 mg  50 mg Intramuscular TID PRN Weber, Kyra A, NP       And   LORazepam  (ATIVAN ) injection 2 mg  2 mg Intramuscular TID PRN Weber, Kyra A, NP       haloperidol  lactate (HALDOL ) injection 10 mg  10 mg Intramuscular TID PRN Weber, Kyra A, NP       And   diphenhydrAMINE  (BENADRYL ) injection 50 mg  50 mg Intramuscular TID PRN Weber, Kyra A, NP       And   LORazepam  (ATIVAN ) injection 2 mg  2 mg Intramuscular TID PRN Weber, Kyra A, NP       feeding supplement (ENSURE PLUS HIGH PROTEIN) liquid 237 mL  237 mL Oral TID BM Estell Puccini I, NP   237 mL at 03/05/24 1351   hydrOXYzine  (ATARAX ) tablet 50 mg  50 mg Oral TID PRN Collene Gouge I, NP   50 mg at 03/05/24 1247   losartan  (COZAAR ) tablet 25 mg  25 mg Oral Daily Markas Aldredge I, NP   25 mg at 03/05/24 0900   magnesium  hydroxide (MILK OF MAGNESIA) suspension 30 mL  30 mL Oral Daily PRN Weber, Kyra A, NP       metFORMIN  (GLUCOPHAGE ) tablet 500 mg  500 mg Oral BID WC Dorrene Bently I, NP   500 mg at 03/05/24 0900   QUEtiapine  (SEROQUEL ) tablet 100 mg  100 mg Oral QHS Noeli Lavery,  Gouge I, NP   100 mg at 03/04/24 2034   QUEtiapine  (SEROQUEL ) tablet 25 mg  25 mg Oral BID Collene Gouge I, NP   25 mg at 03/05/24 1351    Lab Results: No results found for this or any previous visit (from the past 48 hours).  Blood Alcohol level:  Lab Results  Component Value Date   [facility] <15 03/01/2024   ETH <15 02/28/2024    Metabolic Disorder Labs: Lab Results  Component Value Date   HGBA1C 5.4 10/23/2023   MPG 108.28 10/23/2023   MPG 134.11 12/23/2022   Lab Results  Component Value Date   PROLACTIN 20.7 12/23/2022   Lab Results  Component Value Date   CHOL 123 10/23/2023   TRIG 56 10/23/2023   HDL 48 10/23/2023   CHOLHDL 2.6 10/23/2023   VLDL 11 10/23/2023   LDLCALC 64 10/23/2023   LDLCALC 95 12/23/2022   Physical Findings: AIMS:  ,  ,  ,  ,  ,  ,   CIWA:    COWS:     Musculoskeletal: Strength & Muscle Tone: within normal limits Gait & Station: normal Patient leans: N/A  Psychiatric Specialty Exam:  Presentation  General Appearance:  Disheveled (bladder incontinence issues. uses adult depends.)  Eye Contact: Fair  Speech: Clear and Coherent; Normal Rate  Speech Volume: Normal  Handedness: Right   Mood and Affect  Mood: Anxious; Depressed  Affect: Congruent; Flat; Tearful   Thought Process  Thought Processes: Coherent; Goal Directed  Descriptions of Associations:Intact  Orientation:Full (Time, Place and Person)  Thought Content:Logical  History of Schizophrenia/Schizoaffective disorder:Yes  Duration of Psychotic Symptoms:Greater than six months  Hallucinations:No data recorded  Ideas of Reference:None  Suicidal Thoughts:No data recorded  Homicidal Thoughts:No data recorded   Sensorium  Memory: Immediate Good; Recent Good; Remote Good  Judgment: Fair  Insight: Fair   Art therapist  Concentration:  Good  Attention Span: Good  Recall: Good  Fund of Knowledge: Fair  Language: Good   Psychomotor  Activity  Psychomotor Activity: No data recorded   Assets  Assets: Desire for Improvement; Communication Skills; Resilience   Sleep  Sleep: No data recorded  Physical Exam: Physical Exam Vitals reviewed. Exam conducted with a chaperone present.  HENT:     Head: Normocephalic.     Nose: Nose normal.     Mouth/Throat:     Pharynx: Oropharynx is clear.  Cardiovascular:     Pulses: Normal pulses.     Comments: Elevated blood pressure: 147/75 Pulmonary:     Effort: Pulmonary effort is normal.  Genitourinary:    Comments: Deferred Musculoskeletal:     Comments: Lower extremity weakness.  Patient uses walker for gait/balance support.  Skin:    General: Skin is dry.  Neurological:     General: No focal deficit present.     Mental Status: She is oriented to person, place, and time.    Review of Systems  Constitutional:  Negative for chills, diaphoresis and fever.  HENT:  Negative for congestion and sore throat.   Respiratory:  Negative for cough, shortness of breath and wheezing.   Cardiovascular:  Negative for chest pain and palpitations.       Elevated blood pressure: 147/75  Gastrointestinal:  Negative for abdominal pain, constipation, diarrhea, heartburn, nausea and vomiting.  Genitourinary:        Suffering from bladder incontinence.  Use adult depends.  Musculoskeletal:        Lower extremity weakness.  Patient uses walker for gait/balance support.   Skin:  Negative for rash.  Neurological:  Positive for weakness (Lower extremity.). Negative for dizziness, tingling, tremors, sensory change, speech change, focal weakness, seizures, loss of consciousness and headaches.  Endo/Heme/Allergies:        Allergies: Hydrocodone .  Other: Tomato, Latex.  Psychiatric/Behavioral:  Positive for depression, hallucinations and substance abuse. Negative for memory loss. The patient is nervous/anxious and has insomnia.    Blood pressure 136/73, pulse 72, temperature 98.6 F (37  C), temperature source Oral, resp. rate 12, height 5' 4 (1.626 m), weight 66 kg, SpO2 99%. Body mass index is 24.98 kg/m.  Treatment Plan Summary: Daily contact with patient to assess and evaluate symptoms and progress in treatment and Medication management.   Principal/active diagnoses.  Psychoactive substance-induced organic mood disorder.  Cocaine use disorder.   Other medical issues.  Diabetes mellitus, type-2. Hx. HTN.  Plan: The risks/benefits/side-effects/alternatives to the medications in use were discussed in detail with the patient and time was given for patient's questions. The patient consents to medication trial.    -Continue Abilify  20 mg po daily for mood control.  -Continue Cogentin  1 mg po bid for EPS prevention.  -Continue Seroquel  100 mg po Q hs for insomnia. -Continue Hydroxyzine  50 mg po tid prn for anxiety.  -Continue Seroquel  25 mg po bid for agitation.   Agitation protocols.  -Continue as recommended. See MAR.    Other medical issues.  -Continue Glucophage  500 mg po bid for DM.  -Continue Losartan  25 mg po daily for HTN.   Other PRNS -Continue Tylenol  650 mg every 6 hours PRN for mild pain -Continue Maalox 30 ml Q 4 hrs PRN for indigestion -Continue MOM 30 ml po Q 6 hrs for constipation   Safety and Monitoring: Voluntary admission to inpatient psychiatric unit for safety, stabilization and treatment Daily contact with patient to assess and evaluate symptoms and  progress in treatment Patient's case to be discussed in multi-disciplinary team meeting Observation Level : q15 minute checks Vital signs: q12 hours Precautions: Safety   Discharge Planning: Social work and case management to assist with discharge planning and identification of hospital follow-up needs prior to discharge Estimated LOS: 5-7 days Discharge Concerns: Need to establish a safety plan; Medication compliance and effectiveness Discharge Goals: Return home with outpatient  referrals for mental health follow-up including medication management/psychotherapy  Mac Bolster, NP, pmhnp, fnp-bc. 03/05/2024, 5:11 PM Patient ID: Kaitlyn Gray, female   DOB: May 28, 1976, 48 y.o.   MRN: [id number]

## 2024-03-05 NOTE — Progress Notes (Addendum)
 D. Pt presented depressed upon initial approach this morning- reported that she was extremely tired, and that was why she had an accident while she was sleeping. Pt showered with minimal assistance needed. Pt continues to appear very unsteady, requiring walker for ambulation. Pt continues to endorse AH stating, that's why I want to sleep.They keep telling me to hurt myself. Pt agreed to to contact staff before acting on any harmful thoughts.  A. Labs and vitals monitored. Pt given and educated on medications. Pt supported emotionally and encouraged to express concerns and ask questions.   R. Pt remains safe with 15 minute checks. Will continue POC.

## 2024-03-05 NOTE — B[facility] Group Notes (Signed)
 Adult Psychoeducational Group Note  Date:  03/05/2024 Time:  7:52 PM  Group Topic/Focus:  Goals Group:   The focus of this group is to help patients establish daily goals to achieve during treatment and discuss how the patient can incorporate goal setting into their daily lives to aide in recovery. Orientation:   The focus of this group is to educate the patient on the purpose and policies of crisis stabilization and provide a format to answer questions about their admission.  The group details unit policies and expectations of patients while admitted.  Participation Level:  Did Not Attend  Participation Quality:    Affect:    Cognitive:    Insight:   Engagement in Group:    Modes of Intervention:    Additional Comments:    Romanda Turrubiates O 03/05/2024, 7:52 PM

## 2024-03-05 NOTE — Group Note (Signed)
 LCSW Group Therapy Note  Group Date: 03/05/2024 Start Time: 1045 End Time: 1145   Type of Therapy and Topic:  Group Therapy: Using I Statements  Participation Level:  Active  Description of Group:  Patients were asked to provide details of some interpersonal conflicts they have experienced. Patients were then educated about "I" statements, communication which focuses on feelings or views of the speaker rather than what the other person is doing. T group members were asked to reflect on past conflicts and to provide specific examples for utilizing "I" statements.  Therapeutic Goals:  Patients will verbalize understanding of ineffective communication and effective communication. Patients will be able to empathize with whom they are having conflict. Patients will practice effective communication in the form of "I" statements.    Summary of Patient Progress:  Kaitlyn Gray shared her desire and intention to exhibit more patience while remaining on meds, going to the doctor, and contributing to [facility] care. The patient was present and active throughout the session and proved open to feedback from CSW and peers. Patient demonstrated good insight into the subject matter, was respectful of peers, and was present throughout the entire session.  Therapeutic Modalities:   Cognitive Behavioral Therapy Solution-Focused Therapy  Erikka's I statement to promote emotional wellness was I will go love myself when stressed  Hunter JONELLE Lever, LCSW 03/05/2024  5:30 PM

## 2024-03-05 NOTE — Progress Notes (Addendum)
(  Sleep Hours) -8.75 (Any PRNs that were needed, meds refused, or side effects to meds)- none (Any disturbances and when (visitation, over night)-none (Concerns raised by the patient)- none (SI/HI/AVH)-pt has passive SI

## 2024-03-05 NOTE — Group Note (Signed)
 Date:  03/05/2024 Time:  8:35 PM  Group Topic/Focus:  Wrap-Up Group:   The focus of this group is to help patients review their daily goal of treatment and discuss progress on daily workbooks.    Participation Level:  Did Not Attend   Dim Meisinger Dacosta 03/05/2024, 8:35 PM

## 2024-03-05 NOTE — Progress Notes (Addendum)
 Patient observed agitated, yelling in her room,screaming I can't take this anymore Pt unable to be consoled. Pt refused po meds, but agreed to IM prn meds. Pt received 5 mg haldol , 2 mg ativan , 50 benadryl  IM without incident.

## 2024-03-05 NOTE — Progress Notes (Signed)
   03/05/24 2300  Psych Admission Type (Psych Patients Only)  Admission Status Voluntary  Psychosocial Assessment  Patient Complaints None  Eye Contact Other (Comment) (sleeping)  Facial Expression Other (Comment) (sleeping)  Affect Appropriate to circumstance  Speech Soft  Interaction Other (Comment) (sleeping)  Motor Activity Slow  Appearance/Hygiene In scrubs  Behavior Characteristics Other (Comment)  Mood Other (Comment)  Aggressive Behavior  Effect No apparent injury  Thought Process  Coherency Unable to assess  Content UTA  Delusions UTA  Perception UTA  Hallucination UTA  Judgment Limited  Confusion None  Danger to Self  Current suicidal ideation? Denies  Danger to Others  Danger to Others None reported or observed

## 2024-03-05 NOTE — Plan of Care (Signed)
  Problem: Activity: Goal: Sleeping patterns will improve Outcome: Progressing   Problem: Health Behavior/Discharge Planning: Goal: Compliance with treatment plan for underlying cause of condition will improve Outcome: Progressing

## 2024-03-06 NOTE — Group Note (Signed)
 Date:  03/06/2024 Time:  8:40 PM  Group Topic/Focus:  Wrap-Up Group:   The focus of this group is to help patients review their daily goal of treatment and discuss progress on daily workbooks.    Participation Level:  Did Not Attend  Jerrilyn Messinger Dacosta 03/06/2024, 8:40 PM

## 2024-03-06 NOTE — Progress Notes (Signed)
 [facility] MD Progress Note  03/06/2024 6:08 PM Kaitlyn Gray  MRN:  [id number]  Reason for admission: 48 year old AA female with hx of substance use disorders & other mental health issues. Admitted to the [facility] from the [facility] with complaint of passive suicidal ideations without intent, plans or means. Patient was apparently evaluated at the [facility] ED for what she described as I could not move after smoking a bad cocaine). Besides medical evaluation, patient was also evaluated by a physical therapist & was recommended to use walker to aid her ambulation & balance. She was later transferred to the [facility] for psychiatric evaluation/treatments. Patient's chart is reviewed including current lab results. She was admitted/treated/discharged from this [facility] in May of this year, 2025. At the time, she was discharged on Abilify  maintena & recommended to follow-up at the [facility] health for medication management & [facility] for counseling services.   Daily notes: Kaitlyn Gray is seen this morning. Chart reviewed. The chart findings discussed with the treatment team. She presents alert, oriented & aware of situation. She is visible on the unit, ambulating with the aid of a walker. She is attending group sessions when prompted to do so. Patient has been displaying all kinds of behavioral issues all through this shift. She has been demanding to be given a shot of Ativan  2 mg like she received yesterday because she was hearing voices. She stays in her room & scream for one thing or the other. Patient keep asking to be given meals with tomato-based sauce although she is allergic to tomatoes. At the present time, patient seems to be doing well mentally. She is only displaying behavioral issues at this time to get her way, it seems. She is currently dealing with the effects of her medical issues which will be better managed outside this [facility]. She has bladder incontinence issues. She is using a walker  for amulation due to lower extremity weakness. Patient at this time can be discharged tomorrow, if not by Tuesday 03-08-24. Continue current plan of care as already in progress. She was started on Losartan  yesterday for her elevated blood pressure. She currently denies any SIHI, AVH, delusional thoughts or paranoia. She does not appear to be responding to any internal stimuli.  Principal Problem: Psychoactive substance-induced organic mood disorder (HCC)  Diagnosis: Principal Problem:   Psychoactive substance-induced organic mood disorder (HCC) Active Problems:   Cocaine use disorder, severe, dependence (HCC)   MDD (major depressive disorder), recurrent, severe, with psychosis (HCC)  Total Time spent with patient: 45 minutes  Past Psychiatric History: See H&P.  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) 07/19/2017   Hypertension    Hypertensive emergency 05/31/2022   Ischemic stroke (HCC) 01/01/2020   subacute right middle cerebellar peduncle and pons infarction   Left-sided weakness 01/27/2022   Head CT with remote right occipital infarct which is consistent with old left-sided weakness   MDD (major depressive  disorder), recurrent severe, 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   Normocytic anemia 02/09/2020   Opiate use    History of Suboxone  Therapy until 05/2021   Polysubstance abuse (HCC) 07/19/2017   Prescription drug misadventures (Seroquel )      Past Surgical History:  Procedure Laterality Date   CESAREAN SECTION     Family History:  Family History  Problem Relation Age of Onset   Hypertension Mother    CAD Mother 66       died of MI at age 83   Hypertension Father    Family Psychiatric  History: See H&P.  Social History:  Social History   Substance and Sexual Activity  Alcohol Use Not Currently     Social History   Substance and Sexual Activity  Drug Use Yes   Types: Marijuana, Cocaine, Crack cocaine   Comment: occassionally    Social History   Socioeconomic History   Marital status: Single    Spouse name: Not on file   Number of children: Not on file   Years of education: Not on file   Highest education level: Not on file  Occupational History   Not on file  Tobacco Use   Smoking status: Every Day    Current packs/day: 1.00    Average packs/day: 1 pack/day for 25.7 years (25.7 ttl pk-yrs)    Types: Cigarettes    Start date: 06/16/1998    Passive exposure: Current   Smokeless tobacco: Former  Building services engineer status: Every Day  Substance and Sexual Activity   Alcohol use: Not Currently   Drug use: Yes    Types: Marijuana, Cocaine, Crack cocaine    Comment: occassionally   Sexual activity: Never    Birth control/protection: None  Other Topics Concern   Not on file  Social History Narrative   ** Merged History Encounter **       Social Drivers of Health   Financial Resource Strain: Not on file  Food Insecurity: Food Insecurity Present (03/03/2024)   Hunger Vital Sign    Worried About Running Out of Food in the Last Year: Often true    Ran Out of Food in the Last Year: Often true  Transportation Needs: Unmet Transportation Needs (03/03/2024)   PRAPARE - Administrator, Civil Service (Medical): Yes    Lack of Transportation (Non-Medical): Yes  Physical Activity: Not on file  Stress: Not on file  Social Connections: Not on file   Additional Social History:   Sleep: Good  Estimated Sleeping Duration (Last 24 Hours): 8.25-10.00 hours  Appetite:  Good  Current Medications: Current Facility-Administered Medications  Medication Dose Route Frequency Provider Last Rate Last Admin   acetaminophen  (TYLENOL ) tablet 650 mg  650 mg Oral Q6H PRN Weber, Kyra A, NP       alum & mag hydroxide-simeth (MAALOX/MYLANTA) 200-200-20 MG/5ML suspension 30 mL  30 mL Oral Q4H PRN Weber, Kyra A, NP       ARIPiprazole  (ABILIFY ) tablet 20 mg  20 mg Oral Daily Weber, Kyra A, NP   20 mg at 03/06/24 0840   benztropine  (COGENTIN ) tablet 1 mg  1 mg Oral BID Weber, Kyra A, NP   1 mg at 03/06/24 1650   dextromethorphan -guaiFENesin  (MUCINEX  DM) 30-600 MG per 12 hr tablet 1 tablet  1 tablet Oral BID Prentis Kitchens A, DO   1 tablet at 03/06/24 0840   haloperidol  (HALDOL ) tablet 5 mg  5 mg Oral TID PRN Weber, Kyra A, NP  5 mg at 03/03/24 1343   And   diphenhydrAMINE  (BENADRYL ) capsule 50 mg  50 mg Oral TID PRN Weber, Kyra A, NP   50 mg at 03/03/24 1342   haloperidol  lactate (HALDOL ) injection 5 mg  5 mg Intramuscular TID PRN Weber, Kyra A, NP   5 mg at 03/05/24 1853   And   diphenhydrAMINE  (BENADRYL ) injection 50 mg  50 mg Intramuscular TID PRN Weber, Kyra A, NP       And   LORazepam  (ATIVAN ) injection 2 mg  2 mg Intramuscular TID PRN Weber, Kyra A, NP       haloperidol  lactate (HALDOL ) injection 10 mg  10 mg Intramuscular TID PRN Weber, Kyra A, NP       And   diphenhydrAMINE  (BENADRYL ) injection 50 mg  50 mg Intramuscular TID PRN Weber, Kyra A, NP   50 mg at 03/05/24 1852   And   LORazepam  (ATIVAN ) injection 2 mg  2 mg Intramuscular TID PRN Weber, Kyra A, NP   2 mg at 03/05/24 1852   feeding supplement (ENSURE PLUS HIGH PROTEIN) liquid 237 mL  237 mL Oral TID BM Jessamyn Watterson I, NP   237 mL at 03/06/24 1508   hydrOXYzine  (ATARAX ) tablet 50 mg  50 mg Oral TID PRN Collene Gouge I, NP   50 mg at 03/06/24 0353   losartan  (COZAAR ) tablet 25 mg  25 mg Oral Daily Amelianna Meller I, NP   25 mg at  03/06/24 9158   magnesium  hydroxide (MILK OF MAGNESIA) suspension 30 mL  30 mL Oral Daily PRN Weber, Kyra A, NP       metFORMIN  (GLUCOPHAGE ) tablet 500 mg  500 mg Oral BID WC Dhanush Jokerst I, NP   500 mg at 03/06/24 1649   QUEtiapine  (SEROQUEL ) tablet 100 mg  100 mg Oral QHS Amadeus Oyama, Gouge I, NP   100 mg at 03/06/24 0431   QUEtiapine  (SEROQUEL ) tablet 25 mg  25 mg Oral BID Kathlean Cinco I, NP   25 mg at 03/06/24 1649    Lab Results: No results found for this or any previous visit (from the past 48 hours).  Blood Alcohol level:  Lab Results  Component Value Date   [facility] <15 03/01/2024   ETH <15 02/28/2024    Metabolic Disorder Labs: Lab Results  Component Value Date   HGBA1C 5.4 10/23/2023   MPG 108.28 10/23/2023   MPG 134.11 12/23/2022   Lab Results  Component Value Date   PROLACTIN 20.7 12/23/2022   Lab Results  Component Value Date   CHOL 123 10/23/2023   TRIG 56 10/23/2023   HDL 48 10/23/2023   CHOLHDL 2.6 10/23/2023   VLDL 11 10/23/2023   LDLCALC 64 10/23/2023   LDLCALC 95 12/23/2022   Physical Findings: AIMS:  ,  ,  ,  ,  ,  ,   CIWA:    COWS:     Musculoskeletal: Strength & Muscle Tone: within normal limits Gait & Station: normal Patient leans: N/A  Psychiatric Specialty Exam:  Presentation  General Appearance:  Casual  Eye Contact: Good  Speech: Clear and Coherent; Normal Rate  Speech Volume: Normal  Handedness: Right   Mood and Affect  Mood: Irritable; Labile (demanding.)  Affect: Congruent   Thought Process  Thought Processes: Coherent  Descriptions of Associations:Intact  Orientation:Full (Time, Place and Person)  Thought Content:Logical  History of Schizophrenia/Schizoaffective disorder:Yes  Duration of Psychotic Symptoms:Greater than six months  Hallucinations:Hallucinations: -- (Patient has been screaming  she is hearing voices today demanding a shot of Ativan  2 mg like she was given yesterday evening.)   Ideas of  Reference:None  Suicidal Thoughts:Suicidal Thoughts: No   Homicidal Thoughts:Homicidal Thoughts: No    Sensorium  Memory: Immediate Good; Recent Good; Remote Good  Judgment: Poor  Insight: Lacking   Executive Functions  Concentration: Poor  Attention Span: Poor  Recall: Good  Fund of Knowledge: Poor  Language: Good   Psychomotor Activity  Psychomotor Activity: Psychomotor Activity: Normal    Assets  Assets: Manufacturing systems engineer; Housing; Resilience   Sleep  Sleep: Sleep: Good Number of Hours of Sleep: 7.5   Physical Exam: Physical Exam Vitals reviewed. Exam conducted with a chaperone present.  HENT:     Head: Normocephalic.     Nose: Nose normal.     Mouth/Throat:     Pharynx: Oropharynx is clear.  Cardiovascular:     Pulses: Normal pulses.     Comments: Elevated blood pressure: 147/75 Pulmonary:     Effort: Pulmonary effort is normal.  Genitourinary:    Comments: Deferred Musculoskeletal:     Comments: Lower extremity weakness.  Patient uses walker for gait/balance support.  Skin:    General: Skin is dry.  Neurological:     General: No focal deficit present.     Mental Status: She is oriented to person, place, and time.    Review of Systems  Constitutional:  Negative for chills, diaphoresis and fever.  HENT:  Negative for congestion and sore throat.   Respiratory:  Negative for cough, shortness of breath and wheezing.   Cardiovascular:  Negative for chest pain and palpitations.       Elevated blood pressure: 147/75  Gastrointestinal:  Negative for abdominal pain, constipation, diarrhea, heartburn, nausea and vomiting.  Genitourinary:        Suffering from bladder incontinence.  Use adult depends.  Musculoskeletal:        Lower extremity weakness.  Patient uses walker for gait/balance support.   Skin:  Negative for rash.  Neurological:  Positive for weakness (Lower extremity.). Negative for dizziness, tingling, tremors,  sensory change, speech change, focal weakness, seizures, loss of consciousness and headaches.  Endo/Heme/Allergies:        Allergies: Hydrocodone .  Other: Tomato, Latex.  Psychiatric/Behavioral:  Positive for depression, hallucinations and substance abuse. Negative for memory loss. The patient is nervous/anxious and has insomnia.    Blood pressure (!) 141/103, pulse 81, temperature 98.6 F (37 C), temperature source Oral, resp. rate 12, height 5' 4 (1.626 m), weight 66 kg, SpO2 100%. Body mass index is 24.98 kg/m.  Treatment Plan Summary: Daily contact with patient to assess and evaluate symptoms and progress in treatment and Medication management.   Principal/active diagnoses.  Psychoactive substance-induced organic mood disorder.  Cocaine use disorder.   Other medical issues.  Diabetes mellitus, type-2. Hx. HTN.  Plan: The risks/benefits/side-effects/alternatives to the medications in use were discussed in detail with the patient and time was given for patient's questions. The patient consents to medication trial.    -Continue Abilify  20 mg po daily for mood control.  -Continue Cogentin  1 mg po bid for EPS prevention.  -Continue Seroquel  100 mg po Q hs for insomnia. -Continue Hydroxyzine  50 mg po tid prn for anxiety.  -Continue Seroquel  25 mg po bid for agitation.   Agitation protocols.  -Continue as recommended. See MAR.    Other medical issues.  -Continue Glucophage  500 mg po bid for DM.  -Continue Losartan  25 mg po  daily for HTN.   Other PRNS -Continue Tylenol  650 mg every 6 hours PRN for mild pain -Continue Maalox 30 ml Q 4 hrs PRN for indigestion -Continue MOM 30 ml po Q 6 hrs for constipation   Safety and Monitoring: Voluntary admission to inpatient psychiatric unit for safety, stabilization and treatment Daily contact with patient to assess and evaluate symptoms and progress in treatment Patient's case to be discussed in multi-disciplinary team  meeting Observation Level : q15 minute checks Vital signs: q12 hours Precautions: Safety   Discharge Planning: Social work and case management to assist with discharge planning and identification of hospital follow-up needs prior to discharge Estimated LOS: 5-7 days Discharge Concerns: Need to establish a safety plan; Medication compliance and effectiveness Discharge Goals: Return home with outpatient referrals for mental health follow-up including medication management/psychotherapy  Mac Bolster, NP, pmhnp, fnp-bc. 03/06/2024, 6:08 PM Patient ID: Kaitlyn Gray, female   DOB: 07/24/1975, 48 y.o.   MRN: [id number] Patient ID: Kaitlyn Gray, female   DOB: [birth year], 48 y.o.   MRN: [id number]

## 2024-03-06 NOTE — Group Note (Unsigned)
 Date:  03/06/2024 Time:  8:30 PM  Group Topic/Focus:  Wrap-Up Group:   The focus of this group is to help patients review their daily goal of treatment and discuss progress on daily workbooks.     Participation Level:  {BHH PARTICIPATION OZCZO:77735}  Participation Quality:  {BHH PARTICIPATION QUALITY:22265}  Affect:  {BHH AFFECT:22266}  Cognitive:  {BHH COGNITIVE:22267}  Insight: {BHH Insight2:20797}  Engagement in Group:  {BHH ENGAGEMENT IN HMNLE:77731}  Modes of Intervention:  {BHH MODES OF INTERVENTION:22269}  Additional Comments:  ***  Gwenn Nobie [place] 03/06/2024, 8:30 PM

## 2024-03-06 NOTE — Group Note (Unsigned)
 Date:  03/06/2024 Time:  8:51 PM  Group Topic/Focus:  Wrap-Up Group:   The focus of this group is to help patients review their daily goal of treatment and discuss progress on daily workbooks.     Participation Level:  {BHH PARTICIPATION OZCZO:77735}  Participation Quality:  {BHH PARTICIPATION QUALITY:22265}  Affect:  {BHH AFFECT:22266}  Cognitive:  {BHH COGNITIVE:22267}  Insight: {BHH Insight2:20797}  Engagement in Group:  {BHH ENGAGEMENT IN HMNLE:77731}  Modes of Intervention:  {BHH MODES OF INTERVENTION:22269}  Additional Comments:  ***  Stephane Niemann A Prisha Hiley 03/06/2024, 8:51 PM

## 2024-03-06 NOTE — Plan of Care (Signed)
   Problem: Physical Regulation: Goal: Ability to maintain clinical measurements within normal limits will improve Outcome: Progressing   Problem: Safety: Goal: Periods of time without injury will increase Outcome: Progressing

## 2024-03-06 NOTE — Progress Notes (Signed)
 Pt continues to present very unsteady at times. After staff informed pt that she needed to stay back from walking to the cafeteria, pt threw her snack across the dayroom. Staff tried to reassure pt that they were only looking out for her safety and would be back with options for her to choose from. After much redirection and verbal de escalation, pt calmed down agreed to eat a [place] burger. Pt informed by provider that if there was another incident- such as throwing her food- she would be put on unit restriction. Pt appeared to understand

## 2024-03-06 NOTE — Progress Notes (Signed)
(  Sleep Hours) -8 (Any PRNs that were needed, meds refused, or side effects to meds)-  (Any disturbances and when (visitation, over night)-Pt received agitation protocol at 1853.Pt slept until 0345. Pt came into the hallway yelling and stated she needed to take her night medication so she could go back to sleep. Writer explained to pt she was to sedated to received her medication during med pass.Pt stated no one alerted her during med pass to come take her medications.Ecologist explained during med pass writer tried to wake her.Pt stated she had no memory of this. As Clinical research associate spoke to patient a strong urine smell was noted.Staff member  assisted with ADL'S.Pt begin yelling in the hallway and stated she needed to take Seroquel  now. RN administered medication and was able to redirected pt.  (Concerns raised by the patient)- none (SI/HI/AVH)-Denied

## 2024-03-07 MED ORDER — QUETIAPINE FUMARATE 50 MG PO TABS
50.0000 mg | ORAL_TABLET | Freq: Two times a day (BID) | ORAL | Status: AC
  Administered 2024-03-07 – 2024-03-08 (×2): 50 mg via ORAL
  Filled 2024-03-07 (×2): qty 1

## 2024-03-07 MED ORDER — ARIPIPRAZOLE ER 400 MG IM SRER: 400.0000 mg | INTRAMUSCULAR | Status: DC

## 2024-03-07 MED ORDER — QUETIAPINE FUMARATE 200 MG PO TABS
200.0000 mg | ORAL_TABLET | Freq: Every day | ORAL | Status: DC
  Administered 2024-03-07 – 2024-03-08 (×2): 200 mg via ORAL
  Filled 2024-03-07 (×2): qty 1

## 2024-03-07 NOTE — Plan of Care (Signed)
  Problem: Education: Goal: Emotional status will improve Outcome: Progressing   Problem: Activity: Goal: Interest or engagement in activities will improve Outcome: Progressing   Problem: Education: Goal: Mental status will improve Outcome: Progressing   Problem: Coping: Goal: Ability to verbalize frustrations and anger appropriately will improve Outcome: Progressing

## 2024-03-07 NOTE — Progress Notes (Signed)
(  Sleep Hours) - 10.75 (Any PRNs that were needed, meds refused, or side effects to meds)- PRN vistaril  50 mg given, no meds refused.  (Any disturbances and when (visitation, over night)- None  (Concerns raised by the patient)- None  (SI/HI/AVH)- Denies SI/HI/AVH

## 2024-03-07 NOTE — Group Note (Signed)
 Recreation Therapy Group Note   Group Topic:Personal Development  Group Date: 03/07/2024 Start Time: 1016 End Time: 1035 Facilitators: Ahron Hulbert-McCall, LRT,CTRS Location: [address] Dayroom   Group Topic: Personal Development  Goal Area(s) Addresses:  Patient will successfully define what peace to them. Patient will successfully identify benefit of finding peace. Patient will successfully identify how peace impacts their lives.    Behavioral Response: Active  Intervention: Worksheet  Activity: LRT and patients discussed what peace is. Patients were then given a worksheet were they had to identify how peace and it's impact has affected their lives. Patients were to address things such as change, relationships, goals, etc.   Education: Geophysicist/field seismologist  Education Outcome: Acknowledge education, In group clarification offered, Needs additional education   Affect/Mood: Appropriate   Participation Level: Active   Participation Quality: Independent   Behavior: Appropriate   Speech/Thought Process: Focused   Insight: Good   Judgement: Good   Modes of Intervention: Worksheet   Patient Response to Interventions:  Engaged   Education Outcome:  In group clarification offered    Clinical Observations/Individualized Feedback: Pt came in later for group. LRT discussed with patient the importance of finding peace. Pt expressed defining peace as being by myself at times. Pt wasn't able to identify any positive changes she has experienced in life. Pt identified looking to stay drug free/be away from people that do drugs as what she looks for in positive relationships. Pt went on to say her future goal she's working towards is being with her children and that her children, especially her daughter, are the non-material items she is grateful for. Lastly, pt stated she wanted to set limits on saying no to drugs.     Plan: Continue to engage patient in RT group sessions  2-3x/week.   Demondre Aguas-McCall, LRT,CTRS 03/07/2024 12:19 PM

## 2024-03-07 NOTE — Progress Notes (Signed)
 [facility] MD Progress Note  03/07/2024 3:08 PM Kaitlyn Gray  MRN:  [id number]  Reason for admission: 48 year old AA female with hx of substance use disorders & other mental health issues. Admitted to the [facility] from the [facility] with complaint of passive suicidal ideations without intent, plans or means. Patient was apparently evaluated at the [facility] ED for what she described as I could not move after smoking a bad cocaine). Besides medical evaluation, patient was also evaluated by a physical therapist & was recommended to use walker to aid her ambulation & balance. She was later transferred to the [facility] for psychiatric evaluation/treatments. Patient's chart is reviewed including current lab results. She was admitted/treated/discharged from this [facility] in May of this year, 2025. At the time, she was discharged on Abilify  maintena & recommended to follow-up at the [facility] health for medication management & [facility] for counseling services.   Today's assessment notes:  Patient is seen and examined on the unit today.  She is ambulating with a rolling walker and observed attending therapeutic milieu and unit group activities.  She presents alert, calm, and oriented to person, time, place, and situation.  Reports her mood is sad due to not able to locate her phone at [facility] ED where she came from.  Chart reviewed and findings shared with the treatment team and consult with attending psychiatrist.  Patient reports that she lives in her own apartment with no electricity, and currently no phone since she lost her phone at the hospital, and no food.  Encouraged patient to share her concerns with the social worker who would be able to help prior to discharge.  Patient added that her Seroquel  dosage was increased at [facility] to 400 mg p.o. nightly and 200 mg p.o. twice daily.  Chart review confirm increased dosage however, due to patient's presentation at admission,  Seroquel  dosage was reduced.  Seroquel  was titrated today to 200 mg p.o. nightly and 50 mg p.o. twice daily for psychosis/agitation sleep.  No behavioral problems noted today, and no request for Ativan  injection.  Patient compliant with prescribed psychotropic medications.  Capillary blood glucose appears stable as she continues on metformin  500 mg p.o. twice daily.  Reports anxiety as #8/10 and depression #8/10, with 10 being high severity.  She reports good appetite and stable sleep.  Nursing staff report patient sleeping 10.75 hours and being restful.  She denies SI, HI, or AVH.  However reports residual auditory hallucination which is stable for patient.  If patient's symptoms continues to remain stable, she will probably be discharged tomorrow 03/08/2024.  Principal Problem: Psychoactive substance-induced organic mood disorder (HCC)  Diagnosis: Principal Problem:   Psychoactive substance-induced organic mood disorder (HCC) Active Problems:   Cocaine use disorder, severe, dependence (HCC)   MDD (major depressive disorder), recurrent, severe, with psychosis (HCC)  Total Time spent with patient: 45 minutes  Past Psychiatric History: See H&P.  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    Encounter for assessment of healthcare decision-making capacity    History of cervical fracture 12/24/2017   nondisplaced fracture lateral mass of C1 on the right on CT 12/24/17   Hyperosmolar non-ketotic state  in patient with type 2 diabetes mellitus (HCC) 07/19/2017   Hypertension    Hypertensive emergency 05/31/2022   Ischemic stroke (HCC) 01/01/2020   subacute right middle cerebellar peduncle and pons infarction   Left-sided  weakness 01/27/2022   Head CT with remote right occipital infarct which is consistent with old left-sided weakness   MDD (major depressive disorder), recurrent severe, 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   Normocytic anemia 02/09/2020   Opiate use    History of Suboxone  Therapy until 05/2021   Polysubstance abuse (HCC) 07/19/2017   Prescription drug misadventures (Seroquel )     Past Surgical History:  Procedure Laterality Date   CESAREAN SECTION     Family History:  Family History  Problem Relation Age of Onset   Hypertension Mother    CAD Mother 60       died of MI at age 30   Hypertension Father    Family Psychiatric  History: See H&P.  Social History:  Social History   Substance and Sexual Activity  Alcohol Use Not Currently     Social History   Substance and Sexual Activity  Drug Use Yes   Types: Marijuana, Cocaine, Crack cocaine   Comment: occassionally    Social History   Socioeconomic History   Marital status: Single    Spouse name: Not on file   Number of children: Not on file   Years of education: Not on file   Highest education level: Not on file  Occupational History   Not on file  Tobacco Use   Smoking status: Every Day    Current packs/day: 1.00    Average packs/day: 1 pack/day for 25.7 years (25.7 ttl pk-yrs)    Types: Cigarettes    Start date: 06/16/1998    Passive exposure: Current   Smokeless tobacco: Former  Building services engineer status: Every Day  Substance and Sexual Activity   Alcohol use: Not Currently   Drug use: Yes    Types: Marijuana, Cocaine, Crack cocaine    Comment: occassionally   Sexual activity: Never    Birth control/protection: None  Other Topics Concern   Not on file  Social History Narrative   ** Merged History Encounter **       Social Drivers of Health   Financial Resource Strain: Not on file  Food Insecurity: Food Insecurity Present (03/03/2024)   Hunger Vital Sign    Worried About Running Out of Food in  the Last Year: Often true    Ran Out of Food in the Last Year: Often true  Transportation Needs: Unmet Transportation Needs (03/03/2024)   PRAPARE - Administrator, Civil Service (Medical): Yes    Lack of Transportation (Non-Medical): Yes  Physical Activity: Not on file  Stress: Not on file  Social Connections: Not on file   Additional Social History:   Sleep: Good Estimated Sleeping Duration (Last 24 Hours): 7.25-9.00 hours  Appetite:  Good  Current Medications: Current Facility-Administered Medications  Medication Dose Route Frequency Provider Last Rate Last Admin   acetaminophen  (TYLENOL ) tablet 650 mg  650 mg Oral Q6H PRN Weber, Kyra A, NP       alum & mag hydroxide-simeth (MAALOX/MYLANTA) 200-200-20 MG/5ML suspension 30 mL  30 mL Oral Q4H PRN Weber, Kyra A, NP       ARIPiprazole  (ABILIFY ) tablet 20 mg  20 mg Oral Daily Weber, Kyra A, NP   20 mg at 03/07/24 0811   benztropine  (COGENTIN ) tablet 1 mg  1  mg Oral BID Weber, Kyra A, NP   1 mg at 03/07/24 9188   dextromethorphan -guaiFENesin  (MUCINEX  DM) 30-600 MG per 12 hr tablet 1 tablet  1 tablet Oral BID Prentis Oliva LABOR, DO   1 tablet at 03/07/24 9187   haloperidol  (HALDOL ) tablet 5 mg  5 mg Oral TID PRN Weber, Kyra A, NP   5 mg at 03/03/24 1343   And   diphenhydrAMINE  (BENADRYL ) capsule 50 mg  50 mg Oral TID PRN Weber, Kyra A, NP   50 mg at 03/03/24 1342   haloperidol  lactate (HALDOL ) injection 5 mg  5 mg Intramuscular TID PRN Weber, Kyra A, NP   5 mg at 03/05/24 1853   And   diphenhydrAMINE  (BENADRYL ) injection 50 mg  50 mg Intramuscular TID PRN Weber, Kyra A, NP       And   LORazepam  (ATIVAN ) injection 2 mg  2 mg Intramuscular TID PRN Weber, Kyra A, NP       haloperidol  lactate (HALDOL ) injection 10 mg  10 mg Intramuscular TID PRN Weber, Kyra A, NP       And   diphenhydrAMINE  (BENADRYL ) injection 50 mg  50 mg Intramuscular TID PRN Weber, Kyra A, NP   50 mg at 03/05/24 8147   And   LORazepam  (ATIVAN ) injection 2 mg   2 mg Intramuscular TID PRN Weber, Kyra A, NP   2 mg at 03/05/24 1852   feeding supplement (ENSURE PLUS HIGH PROTEIN) liquid 237 mL  237 mL Oral TID BM Nwoko, Agnes I, NP   237 mL at 03/07/24 1348   hydrOXYzine  (ATARAX ) tablet 50 mg  50 mg Oral TID PRN Collene Gouge I, NP   50 mg at 03/07/24 1346   losartan  (COZAAR ) tablet 25 mg  25 mg Oral Daily Nwoko, Agnes I, NP   25 mg at 03/07/24 9187   magnesium  hydroxide (MILK OF MAGNESIA) suspension 30 mL  30 mL Oral Daily PRN Weber, Kyra A, NP       metFORMIN  (GLUCOPHAGE ) tablet 500 mg  500 mg Oral BID WC Nwoko, Agnes I, NP   500 mg at 03/06/24 1649   QUEtiapine  (SEROQUEL ) tablet 100 mg  100 mg Oral QHS Nwoko, Gouge I, NP   100 mg at 03/06/24 2007   QUEtiapine  (SEROQUEL ) tablet 25 mg  25 mg Oral BID Nwoko, Agnes I, NP   25 mg at 03/07/24 9188   Lab Results: No results found for this or any previous visit (from the past 48 hours).  Blood Alcohol level:  Lab Results  Component Value Date   [facility] <15 03/01/2024   ETH <15 02/28/2024   Metabolic Disorder Labs: Lab Results  Component Value Date   HGBA1C 5.4 10/23/2023   MPG 108.28 10/23/2023   MPG 134.11 12/23/2022   Lab Results  Component Value Date   PROLACTIN 20.7 12/23/2022   Lab Results  Component Value Date   CHOL 123 10/23/2023   TRIG 56 10/23/2023   HDL 48 10/23/2023   CHOLHDL 2.6 10/23/2023   VLDL 11 10/23/2023   LDLCALC 64 10/23/2023   LDLCALC 95 12/23/2022   Physical Findings: AIMS:  ,  ,  ,  ,  ,  ,   CIWA:    COWS:     Musculoskeletal: Strength & Muscle Tone: within normal limits Gait & Station: normal Patient leans: N/A  Psychiatric Specialty Exam:  Presentation  General Appearance:  Appropriate for Environment; Casual  Eye Contact: Good  Speech: Clear and Coherent  Speech Volume: Normal  Handedness: Right  Mood and Affect  Mood: Anxious; Depressed  Affect: Congruent  Thought Process  Thought Processes: Coherent  Descriptions of  Associations:Intact  Orientation:Full (Time, Place and Person)  Thought Content:Logical  History of Schizophrenia/Schizoaffective disorder:Yes  Duration of Psychotic Symptoms:Greater than six months  Hallucinations:Hallucinations: None (Denies today, however has residual auditory hallucination)  Ideas of Reference:None  Suicidal Thoughts:Suicidal Thoughts: No SI Passive Intent and/or Plan: -- (Denies)  Homicidal Thoughts:Homicidal Thoughts: No  Sensorium  Memory: Immediate Good; Recent Good  Judgment: Fair  Insight: Fair  Art therapist  Concentration: Fair  Attention Span: Fair  Recall: [employer] of Knowledge: Fair  Language: Fair  Psychomotor Activity  Psychomotor Activity: Psychomotor Activity: Normal (Ambulates with a rolling walker)  Assets  Assets: Manufacturing systems engineer; Housing; Physical Health; Resilience  Sleep  Sleep: Sleep: Good Number of Hours of Sleep: 10.75  Physical Exam: Physical Exam Vitals reviewed. Exam conducted with a chaperone present.  Constitutional:      General: She is not in acute distress.    Appearance: She is not ill-appearing.  HENT:     Head: Normocephalic.     Right Ear: External ear normal.     Left Ear: External ear normal.     Nose: Nose normal.     Mouth/Throat:     Mouth: Mucous membranes are moist.     Pharynx: Oropharynx is clear.  Eyes:     Extraocular Movements: Extraocular movements intact.  Cardiovascular:     Rate and Rhythm: Normal rate.     Pulses: Normal pulses.     Comments: Elevated blood pressure: 147/75 Pulmonary:     Effort: Pulmonary effort is normal.  Abdominal:     Comments: Deferred  Genitourinary:    Comments: Deferred Musculoskeletal:     Cervical back: Normal range of motion.     Comments: Lower extremity weakness.  Patient uses walker for gait/balance support.  Skin:    General: Skin is dry.  Neurological:     General: No focal deficit present.     Mental  Status: She is alert and oriented to person, place, and time.  Psychiatric:        Mood and Affect: Mood normal.        Behavior: Behavior normal.    Review of Systems  Constitutional:  Negative for chills, diaphoresis and fever.  HENT:  Negative for congestion and sore throat.   Eyes:  Negative for blurred vision.  Respiratory:  Negative for cough, sputum production, shortness of breath and wheezing.   Cardiovascular:  Negative for chest pain and palpitations.       Elevated blood pressure: 147/75  Gastrointestinal:  Negative for abdominal pain, constipation, diarrhea, heartburn, nausea and vomiting.  Genitourinary:        Suffering from bladder incontinence.  Use adult depends.  Musculoskeletal:  Negative for falls.       Lower extremity weakness.  Patient uses walker for gait/balance support.   Skin:  Negative for itching and rash.  Neurological:  Positive for weakness (Lower extremity.). Negative for dizziness, tingling, tremors, sensory change, speech change, focal weakness, seizures, loss of consciousness and headaches.  Endo/Heme/Allergies:        Allergies: Hydrocodone .  Other: Tomato, Latex.  Psychiatric/Behavioral:  Positive for depression and hallucinations. Negative for memory loss, substance abuse and suicidal ideas. The patient is nervous/anxious. The patient does not have insomnia.    Blood pressure (!) 144/78, pulse 72, temperature 98.3 F (36.8 C),  temperature source Oral, resp. rate 12, height 5' 4 (1.626 m), weight 66 kg, SpO2 99%. Body mass index is 24.98 kg/m.  Treatment Plan Summary: Daily contact with patient to assess and evaluate symptoms and progress in treatment and Medication management.   Principal/active diagnoses.  Psychoactive substance-induced organic mood disorder.  Cocaine use disorder.   Other medical issues.  Diabetes mellitus, type-2. Hx. HTN.  Plan: The risks/benefits/side-effects/alternatives to the medications in use were discussed  in detail with the patient and time was given for patient's questions. The patient consents to medication trial.   -Resume home Abilify  maintainar 400 mg IM q. 28 days.  Next dose due 03/24/2024 -Abilify  20 mg po daily for mood control.  -Continue Cogentin  1 mg po bid for EPS prevention.  - Increase Seroquel  from 100 mg to 200 mg po Q hs for psychosis insomnia. -Continue Hydroxyzine  50 mg po tid prn for anxiety.  - Increase Seroquel  from 25 mg to 50 mg po bid for agitation.   Agitation protocols.  -Continue as recommended. See MAR.    Other medical issues.  -Continue Glucophage  500 mg po bid for DM.  -Continue Losartan  25 mg po daily for HTN.   Other PRNS -Continue Tylenol  650 mg every 6 hours PRN for mild pain -Continue Maalox 30 ml Q 4 hrs PRN for indigestion -Continue MOM 30 ml po Q 6 hrs for constipation   Safety and Monitoring: Voluntary admission to inpatient psychiatric unit for safety, stabilization and treatment Daily contact with patient to assess and evaluate symptoms and progress in treatment Patient's case to be discussed in multi-disciplinary team meeting Observation Level : q15 minute checks Vital signs: q12 hours Precautions: Safety   Discharge Planning: Social work and case management to assist with discharge planning and identification of hospital follow-up needs prior to discharge Estimated LOS: 5-7 days Discharge Concerns: Need to establish a safety plan; Medication compliance and effectiveness Discharge Goals: Return home with outpatient referrals for mental health follow-up including medication management/psychotherapy  Ellouise JAYSON Azure, FNP. 03/07/2024, 3:08 PM Patient ID: Kaitlyn Gray, female   DOB: 03-May-1976, 48 y.o.   MRN: [id number] Patient ID: Kaitlyn Gray, female   DOB: Dec 15, 1975, 48 y.o.   MRN: [id number] Patient ID: Kaitlyn Gray, female   DOB: 1976/06/05, 48 y.o.   MRN: [id number]

## 2024-03-07 NOTE — Group Note (Signed)
 Date:  03/07/2024 Time:  8:25 PM  Group Topic/Focus:  Wrap-Up Group:   The focus of this group is to help patients review their daily goal of treatment and discuss progress on daily workbooks.    Participation Level:  Did Not Attend   Sanaai Doane Dacosta 03/07/2024, 8:25 PM

## 2024-03-07 NOTE — Group Note (Signed)
 LCSW Group Therapy Note   Group Date: 03/07/2024 Start Time: 1300 End Time: 1400   Participation:  did not attend  Title: Finding Balance: Using [employer] for Thoughtful Decisions  Objective:  To help participants understand and apply the concept of Delsie Mind to make balanced, thoughtful decisions by integrating emotion and logic.  Goals: Learn the differences between Emotional Mind, Reasonable Mind, and [employer]. Recognize personal signs of Emotional and Reasonable Mind. Practice using [employer] in real-life scenarios.  Therapeutic Modalities: Elements of Dialectical Behavior Therapy (DBT):  Mindfulness (noticing thoughts and emotions without judgment), Emotion Regulation (understanding and managing emotional responses), Distress Tolerance (coping with difficult situations without making them worse), Wise Mind (integrating emotion and reason for balanced decision-making) Elements of Cognitive Behavioral Therapy (CBT):  Identifying automatic thoughts, Challenging cognitive distortions, Using logic to reframe unhelpful thinking patterns  Summary:  This class focused on Wise Mind--DBT's concept of balancing Emotional Mind and Reasonable Mind. We identified when we're in each state and practiced using Wise Mind to respond thoughtfully in real-life situations. By combining emotion and logic, participants can improve decision-making, manage challenges, and enhance relationships.   Modean Mccullum O Telisha Zawadzki, LCSWA 03/07/2024  4:59 PM

## 2024-03-07 NOTE — Progress Notes (Signed)
   03/07/24 0800  Psych Admission Type (Psych Patients Only)  Admission Status Voluntary  Psychosocial Assessment  Patient Complaints Depression  Eye Contact Fair  Facial Expression Flat  Affect Appropriate to circumstance  Speech Logical/coherent  Interaction Assertive  Motor Activity Slow  Appearance/Hygiene In scrubs;Improved  Behavior Characteristics Cooperative;Appropriate to situation  Mood Depressed  Thought Process  Coherency WDL  Content WDL  Delusions None reported or observed  Perception WDL  Hallucination None reported or observed  Judgment Limited  Confusion None  Danger to Self  Current suicidal ideation? Denies  Agreement Not to Harm Self Yes  Description of Agreement Verbal  Danger to Others  Danger to Others None reported or observed

## 2024-03-07 NOTE — Plan of Care (Signed)
   Problem: Education: Goal: Emotional status will improve Outcome: Progressing Goal: Mental status will improve Outcome: Progressing Goal: Verbalization of understanding the information provided will improve Outcome: Progressing   Problem: Activity: Goal: Interest or engagement in activities will improve Outcome: Progressing

## 2024-03-08 MED ORDER — QUETIAPINE FUMARATE 50 MG PO TABS
50.0000 mg | ORAL_TABLET | Freq: Two times a day (BID) | ORAL | Status: DC
  Administered 2024-03-08 – 2024-03-09 (×2): 50 mg via ORAL
  Filled 2024-03-08 (×2): qty 1

## 2024-03-08 NOTE — Progress Notes (Signed)
(  Sleep Hours) -8.75  (Any PRNs that were needed, meds refused, or side effects to meds)- n/a  (Any disturbances and when (visitation, over night)-n/a  (Concerns raised by the patient)- pt stated they lost my phone at the hospital , pt stated she needs a walker when she D/C  (SI/HI/AVH)- AVH-better

## 2024-03-08 NOTE — Plan of Care (Signed)
   Problem: Education: Goal: Emotional status will improve Outcome: Progressing Goal: Mental status will improve Outcome: Progressing   Problem: Activity: Goal: Interest or engagement in activities will improve Outcome: Progressing Goal: Sleeping patterns will improve Outcome: Progressing

## 2024-03-08 NOTE — Progress Notes (Cosign Needed Addendum)
 [facility] MD Progress Note  03/08/2024 4:40 PM Kaitlyn Gray  MRN:  [id number]  Reason for admission: 48 year old AA female with hx of substance use disorders & other mental health issues. Admitted to the [facility] from the [facility] with complaint of passive suicidal ideations without intent, plans or means. Patient was apparently evaluated at the [facility] ED for what she described as I could not move after smoking a bad cocaine). Besides medical evaluation, patient was also evaluated by a physical therapist & was recommended to use walker to aid her ambulation & balance. She was later transferred to the [facility] for psychiatric evaluation/treatments. Patient's chart is reviewed including current lab results. She was admitted/treated/discharged from this [facility] in May of this year, 2025. At the time, she was discharged on Abilify  maintena & recommended to follow-up at the [facility] health for medication management & [facility] for counseling services.   Today's assessment notes:  During assessment today, patient is seen and examined in her room lying on her bed.  She presents alert, calm, and oriented to person, time, place, and situation. Chart reviewed and findings shared with the treatment team and consult with attending psychiatrist with recommendation to continue current treatment plan as already in progress and if symptoms continues to improve, patient could possibly be discharged tomorrow 03/09/2024.  Patient reports she would probably go to a shelter as she does not have electricity, mobile phone, or food in her apartment.  DME of rolling walker ordered prior to patient discharge.  Encouraged patient to share her concerns with the social worker who would be able to help with her concerns, prior to discharge.  Patient reports understanding of continuing on her Abilify  Maintena 400 mg q. monthly LAI, with next dose due 03/24/2024.  Added, I would be going to [facility] behavioral  health urgent care to receive the shots as I do not have ACT Team. I have already received my shot for this month.  No behavioral problems noted today, and no request for Ativan  injection.  Patient compliant with prescribed psychotropic medications.  Capillary blood glucose appears stable as she continues on metformin  500 mg p.o. twice daily.  Reports anxiety as #8/10 and depression #9/10, with 10 being high severity.  Patient reports high levels of anxiety and depression probably due to pending discharge tomorrow.  She reports good appetite and stable sleep.  Nursing staff report patient sleeping 8.0 hours and being restful.  She denies SI, HI, or AVH.  However, reports residual auditory hallucination which is stable for patient.    Principal Problem: Psychoactive substance-induced organic mood disorder (HCC)  Diagnosis: Principal Problem:   Psychoactive substance-induced organic mood disorder (HCC) Active Problems:   Cocaine use disorder, severe, dependence (HCC)   MDD (major depressive disorder), recurrent, severe, with psychosis (HCC)  Total Time spent with patient: 45 minutes  Past Psychiatric History: See H&P.  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 (  HCC) 07/19/2017   Hypertension    Hypertensive emergency 05/31/2022   Ischemic stroke (HCC) 01/01/2020   subacute right middle cerebellar peduncle and pons infarction   Left-sided weakness 01/27/2022    Head CT with remote right occipital infarct which is consistent with old left-sided weakness   MDD (major depressive disorder), recurrent severe, 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   Normocytic anemia 02/09/2020   Opiate use    History of Suboxone  Therapy until 05/2021   Polysubstance abuse (HCC) 07/19/2017   Prescription drug misadventures (Seroquel )     Past Surgical History:  Procedure Laterality Date   CESAREAN SECTION     Family History:  Family History  Problem Relation Age of Onset   Hypertension Mother    CAD Mother 42       died of MI at age 77   Hypertension Father    Family Psychiatric  History: See H&P.  Social History:  Social History   Substance and Sexual Activity  Alcohol Use Not Currently     Social History   Substance and Sexual Activity  Drug Use Yes   Types: Marijuana, Cocaine, Crack cocaine   Comment: occassionally    Social History   Socioeconomic History   Marital status: Single    Spouse name: Not on file   Number of children: Not on file   Years of education: Not on file   Highest education level: Not on file  Occupational History   Not on file  Tobacco Use   Smoking status: Every Day    Current packs/day: 1.00    Average packs/day: 1 pack/day for 25.7 years (25.7 ttl pk-yrs)    Types: Cigarettes    Start date: 06/16/1998    Passive exposure: Current   Smokeless tobacco: Former  Building services engineer status: Every Day  Substance and Sexual Activity   Alcohol use: Not Currently   Drug use: Yes    Types: Marijuana, Cocaine, Crack cocaine    Comment: occassionally   Sexual activity: Never    Birth control/protection: None  Other Topics Concern   Not on file  Social History Narrative   ** Merged History Encounter **       Social Drivers of Health   Financial Resource Strain: Not on file  Food Insecurity: Food Insecurity Present (03/03/2024)   Hunger Vital Sign    Worried About Running Out of Food in the Last Year: Often true     Ran Out of Food in the Last Year: Often true  Transportation Needs: Unmet Transportation Needs (03/03/2024)   PRAPARE - Administrator, Civil Service (Medical): Yes    Lack of Transportation (Non-Medical): Yes  Physical Activity: Not on file  Stress: Not on file  Social Connections: Not on file   Additional Social History:   Sleep: Good Estimated Sleeping Duration (Last 24 Hours): 6.00-7.75 hours  Appetite:  Good  Current Medications: Current Facility-Administered Medications  Medication Dose Route Frequency Provider Last Rate Last Admin   acetaminophen  (TYLENOL ) tablet 650 mg  650 mg Oral Q6H PRN Weber, Kyra A, NP   650 mg at 03/08/24 1502   alum & mag hydroxide-simeth (MAALOX/MYLANTA) 200-200-20 MG/5ML suspension 30 mL  30 mL Oral Q4H PRN Weber, Kyra A, NP       ARIPiprazole  (ABILIFY ) tablet 20 mg  20 mg Oral Daily Weber, Kyra A, NP   20 mg at 03/08/24 0902   [START ON 03/24/2024] ARIPiprazole  ER (ABILIFY  MAINTENA) injection 400 mg  400 mg Intramuscular Q28 days Kamaron Deskins C, FNP       benztropine  (COGENTIN ) tablet 1 mg  1 mg Oral BID Weber, Kyra A, NP   1 mg at 03/08/24 9097   dextromethorphan -guaiFENesin  (MUCINEX  DM) 30-600 MG per 12 hr tablet 1 tablet  1 tablet Oral BID Prentis Kitchens A, DO   1 tablet at 03/08/24 9097   haloperidol  (HALDOL ) tablet 5 mg  5 mg Oral TID PRN Weber, Kyra A, NP   5 mg at 03/03/24 1343   And   diphenhydrAMINE  (BENADRYL ) capsule 50 mg  50 mg Oral TID PRN Weber, Kyra A, NP   50 mg at 03/03/24 1342   haloperidol  lactate (HALDOL ) injection 5 mg  5 mg Intramuscular TID PRN Weber, Kyra A, NP   5 mg at 03/05/24 1853   And   diphenhydrAMINE  (BENADRYL ) injection 50 mg  50 mg Intramuscular TID PRN Weber, Kyra A, NP       And   LORazepam  (ATIVAN ) injection 2 mg  2 mg Intramuscular TID PRN Weber, Kyra A, NP       haloperidol  lactate (HALDOL ) injection 10 mg  10 mg Intramuscular TID PRN Weber, Kyra A, NP       And   diphenhydrAMINE  (BENADRYL )  injection 50 mg  50 mg Intramuscular TID PRN Weber, Kyra A, NP   50 mg at 03/05/24 8147   And   LORazepam  (ATIVAN ) injection 2 mg  2 mg Intramuscular TID PRN Weber, Kyra A, NP   2 mg at 03/05/24 1852   feeding supplement (ENSURE PLUS HIGH PROTEIN) liquid 237 mL  237 mL Oral TID BM Nwoko, Agnes I, NP   237 mL at 03/08/24 1501   hydrOXYzine  (ATARAX ) tablet 50 mg  50 mg Oral TID PRN Collene Gouge I, NP   50 mg at 03/08/24 1501   losartan  (COZAAR ) tablet 25 mg  25 mg Oral Daily Nwoko, Agnes I, NP   25 mg at 03/08/24 9097   magnesium  hydroxide (MILK OF MAGNESIA) suspension 30 mL  30 mL Oral Daily PRN Weber, Kyra A, NP       metFORMIN  (GLUCOPHAGE ) tablet 500 mg  500 mg Oral BID WC Nwoko, Agnes I, NP   500 mg at 03/06/24 1649   QUEtiapine  (SEROQUEL ) tablet 200 mg  200 mg Oral QHS Khi Mcmillen C, FNP   200 mg at 03/07/24 2055   Lab Results: No results found for this or any previous visit (from the past 48 hours).  Blood Alcohol level:  Lab Results  Component Value Date   [facility] <15 03/01/2024   ETH <15 02/28/2024   Metabolic Disorder Labs: Lab Results  Component Value Date   HGBA1C 5.4 10/23/2023   MPG 108.28 10/23/2023   MPG 134.11 12/23/2022   Lab Results  Component Value Date   PROLACTIN 20.7 12/23/2022   Lab Results  Component Value Date   CHOL 123 10/23/2023   TRIG 56 10/23/2023   HDL 48 10/23/2023   CHOLHDL 2.6 10/23/2023   VLDL 11 10/23/2023   LDLCALC 64 10/23/2023   LDLCALC 95 12/23/2022   Physical Findings: AIMS:  ,  ,  ,  ,  ,  ,   CIWA:    COWS:     Musculoskeletal: Strength & Muscle Tone: within normal limits Gait & Station: normal Patient leans: N/A  Psychiatric Specialty Exam:  Presentation  General Appearance:  Appropriate for Environment; Casual  Eye Contact: Good  Speech: Clear and Coherent  Speech Volume: Normal  Handedness: Right  Mood and Affect  Mood: Depressed; Anxious  Affect: Congruent  Thought Process  Thought  Processes: Coherent  Descriptions of Associations:Intact  Orientation:Full (Time, Place and Person)  Thought Content:Logical  History of Schizophrenia/Schizoaffective disorder:Yes  Duration of Psychotic Symptoms:Greater than six months  Hallucinations:Hallucinations: -- (Denies today, however has residual auditory hallucination)  Ideas of Reference:None  Suicidal Thoughts:Suicidal Thoughts: No SI Passive Intent and/or Plan: -- (Denies)  Homicidal Thoughts:Homicidal Thoughts: No  Sensorium  Memory: Immediate Good; Recent Good  Judgment: Fair  Insight: Fair  Chartered certified accountant: Fair  Attention Span: Fair  Recall: [employer] of Knowledge: Fair  Language: Fair  Psychomotor Activity  Psychomotor Activity: Psychomotor Activity: Decreased (Ambulating per rolling walker)  Assets  Assets: Communication Skills; Physical Health; Resilience  Sleep  Sleep: Sleep: Good Number of Hours of Sleep: 8  Physical Exam: Physical Exam Vitals reviewed. Exam conducted with a chaperone present.  Constitutional:      General: She is not in acute distress.    Appearance: She is obese. She is not ill-appearing.  HENT:     Head: Normocephalic.     Right Ear: External ear normal.     Left Ear: External ear normal.     Nose: Nose normal.     Mouth/Throat:     Mouth: Mucous membranes are moist.     Pharynx: Oropharynx is clear.  Eyes:     Extraocular Movements: Extraocular movements intact.  Cardiovascular:     Rate and Rhythm: Normal rate.     Pulses: Normal pulses.     Comments: Elevated blood pressure: 147/75 Pulmonary:     Effort: Pulmonary effort is normal.  Abdominal:     Comments: Deferred  Genitourinary:    Comments: Deferred Musculoskeletal:     Cervical back: Normal range of motion.     Comments: Lower extremity weakness.  Patient uses walker for gait/balance support.  Skin:    General: Skin is dry.  Neurological:     General: No  focal deficit present.     Mental Status: She is alert and oriented to person, place, and time.  Psychiatric:        Mood and Affect: Mood normal.        Behavior: Behavior normal.    Review of Systems  Constitutional:  Negative for chills, diaphoresis and fever.  HENT:  Negative for congestion and sore throat.   Eyes:  Negative for blurred vision.  Respiratory:  Negative for cough, sputum production, shortness of breath and wheezing.   Cardiovascular:  Negative for chest pain and palpitations.       Elevated blood pressure: 147/75  Gastrointestinal:  Negative for abdominal pain, constipation, diarrhea, heartburn, nausea and vomiting.  Genitourinary:        Suffering from bladder incontinence.  Use adult depends.  Musculoskeletal:  Negative for falls.       Lower extremity weakness.  Patient uses walker for gait/balance support.   Skin:  Negative for itching and rash.  Neurological:  Positive for weakness (Lower extremity.). Negative for dizziness, tingling, tremors, sensory change, speech change, focal weakness, seizures, loss of consciousness and headaches.  Endo/Heme/Allergies:        Allergies: Hydrocodone .  Other: Tomato, Latex.  Psychiatric/Behavioral:  Positive for depression and hallucinations. Negative for memory loss, substance abuse and suicidal ideas. The patient is nervous/anxious. The patient does not have insomnia.    Blood pressure (!) 152/76, pulse 64, temperature 97.9 F (36.6  C), temperature source Oral, resp. rate 12, height 5' 4 (1.626 m), weight 66 kg, SpO2 100%. Body mass index is 24.98 kg/m.  Treatment Plan Summary: Daily contact with patient to assess and evaluate symptoms and progress in treatment and Medication management.   Principal/active diagnoses.  Psychoactive substance-induced organic mood disorder.  Cocaine use disorder.   Other medical issues.  Diabetes mellitus, type-2. Hx. HTN.  Plan: The risks/benefits/side-effects/alternatives to  the medications in use were discussed in detail with the patient and time was given for patient's questions. The patient consents to medication trial.   -Resume home Abilify  maintainar 400 mg IM q. 28 days.  Next dose due 03/24/2024 -Continue Abilify  20 mg po daily for mood control.  -Continue Cogentin  1 mg po bid for EPS prevention.  -Continue Seroquel  tablet 200 mg po Q hs for psychosis insomnia. -Continue Hydroxyzine  50 mg po tid prn for anxiety.  -Continue Seroquel  tablet 50 mg po bid for agitation.   Agitation protocols.  -Continue as recommended. See MAR.    Other medical issues.  -Continue Glucophage  500 mg po bid for DM.  -Continue Losartan  25 mg po daily for HTN.   Other PRNS -Continue Tylenol  650 mg every 6 hours PRN for mild pain -Continue Maalox 30 ml Q 4 hrs PRN for indigestion -Continue MOM 30 ml po Q 6 hrs for constipation   Safety and Monitoring: Voluntary admission to inpatient psychiatric unit for safety, stabilization and treatment Daily contact with patient to assess and evaluate symptoms and progress in treatment Patient's case to be discussed in multi-disciplinary team meeting Observation Level : q15 minute checks Vital signs: q12 hours Precautions: Safety   Discharge Planning: Social work and case management to assist with discharge planning and identification of hospital follow-up needs prior to discharge Estimated LOS: 5-7 days Discharge Concerns: Need to establish a safety plan; Medication compliance and effectiveness Discharge Goals: Return home with outpatient referrals for mental health follow-up including medication management/psychotherapy  Ellouise JAYSON Azure, FNP. 03/08/2024, 4:40 PM Patient ID: Kaitlyn Gray, female   DOB: May 13, 1976, 48 y.o.   MRN: [id number] Patient ID: Kaitlyn Gray, female   DOB: [birth year], 48 y.o.   MRN: [id number] Patient ID: Kaitlyn Gray, female   DOB: January 02, 1976, 48 y.o.   MRN: [id number] Patient ID: Kaitlyn Gray,  female   DOB: [birth year], 48 y.o.   MRN: [id number]

## 2024-03-08 NOTE — Group Note (Signed)
 Date:  03/08/2024 Time:  8:43 PM  Group Topic/Focus:  Wrap-Up Group:   The focus of this group is to help patients review their daily goal of treatment and discuss progress on daily workbooks.    Participation Level:  Active  Participation Quality:  Appropriate  Affect:  Appropriate  Cognitive:  Appropriate  Insight: Appropriate  Engagement in Group:  Developing/Improving  Modes of Intervention:  Discussion  Additional Comments:  Pt stated her goal for today was to focus on her treatment plan and talk with her treatment team about her aftercare plans. Pt stated she accomplished her goals today. Pt stated she talked with her doctor and with her social worker about her care today. Pt rated her overall day a 5 out of 10. Pt stated she made no calls today. Pt stated she felt better about herself tonight. Pt stated she was able to attend all meals. Pt stated she took all medications provided today. Pt stated her appetite was pretty good today. Pt rated her sleep last night was pretty good. Pt stated the goal tonight was to get some rest. Pt stated she had some physical pain tonight.Pt stated she had some mild pain in her lower back. Pt rated the mild pain in her lower back a 5 on the pain level scale. Pt nurse was updated on the situation.  Pt admitted to dealing with visual hallucinations and auditory issues tonight. Pt nurse was updated on the situation.  Pt denies thoughts of harming herself or others. Pt stated she would alert staff if anything changed.  Lonni Na 03/08/2024, 8:43 PM

## 2024-03-08 NOTE — BHH Counselor (Signed)
 CSW met with patient regarding tentative discharge planning process. Patient was provided with contact information for previous ED where she stated they misplaced her belongings. CSW team contact ED yesterday and the day prior. ED reported no recollection of patient's belongings there. CSW also provided patient with contact information for [facility] [phone].   CSW faxed referral to [facility]) for potential setup for ACT services. Awaiting response and date for interview.  Jonael Paradiso, LCSWA 03/08/24 11:30am

## 2024-03-08 NOTE — Progress Notes (Addendum)
(  Sleep Hours) -7.5  (Any PRNs that were needed, meds refused, or side effects to meds)- Vistaril  50 mg  (Any disturbances and when (visitation, over night)-n/a  (Concerns raised by the patient)- none  (SI/HI/AVH)-denies

## 2024-03-08 NOTE — Group Note (Signed)
 Recreation Therapy Group Note   Group Topic:Problem Solving  Group Date: 03/08/2024 Start Time: 1040 End Time: 1100 Facilitators: Catriona Dillenbeck-McCall, LRT,CTRS Location: [address] Dayroom   Group Topic: Problem Solving   Goal Area(s) Addresses:  Patient will effectively work in a team with other group members. Patient will verbalize importance of using appropriate problem solving techniques.   Behavioral Response:   Intervention: Brain Teasers, Codebreakers  Activity: Patients were given two worksheets that had various brain teasers and code breakers. Patients could work together to figure out the teasers and crack the codes presented. LRT and patients would go over the answers once completed.   Education: Problem Solving  Education Outcome: Acknowledges understanding/In group clarification offered/Needs additional education.    Affect/Mood: N/A   Participation Level: Did not attend    Clinical Observations/Individualized Feedback:     Plan: Continue to engage patient in RT group sessions 2-3x/week.   Abbigale Mcelhaney-McCall, LRT,CTRS 03/08/2024 1:24 PM

## 2024-03-08 NOTE — Plan of Care (Signed)
   Problem: Education: Goal: Emotional status will improve Outcome: Progressing Goal: Mental status will improve Outcome: Progressing   Problem: Activity: Goal: Sleeping patterns will improve Outcome: Progressing   Problem: Safety: Goal: Periods of time without injury will increase Outcome: Progressing

## 2024-03-08 NOTE — Plan of Care (Signed)
   Problem: Education: Goal: Emotional status will improve Outcome: Progressing Goal: Mental status will improve Outcome: Progressing Goal: Verbalization of understanding the information provided will improve Outcome: Progressing   Problem: Activity: Goal: Interest or engagement in activities will improve Outcome: Progressing

## 2024-03-09 ENCOUNTER — Inpatient Hospital Stay: Payer: MEDICAID | Admitting: Family Medicine

## 2024-03-09 ENCOUNTER — Other Ambulatory Visit: Payer: Self-pay | Admitting: Pharmacy Technician

## 2024-03-09 ENCOUNTER — Other Ambulatory Visit (HOSPITAL_COMMUNITY): Payer: Self-pay

## 2024-03-09 ENCOUNTER — Other Ambulatory Visit: Payer: Self-pay

## 2024-03-09 MED ORDER — METFORMIN HCL 500 MG PO TABS
500.0000 mg | ORAL_TABLET | Freq: Two times a day (BID) | ORAL | 0 refills | Status: DC
  Filled 2024-03-09: qty 60, 30d supply, fill #0

## 2024-03-09 MED ORDER — LOSARTAN POTASSIUM 25 MG PO TABS
25.0000 mg | ORAL_TABLET | Freq: Every day | ORAL | 0 refills | Status: DC
  Filled 2024-03-09: qty 30, 30d supply, fill #0

## 2024-03-09 MED ORDER — ARIPIPRAZOLE 20 MG PO TABS
20.0000 mg | ORAL_TABLET | Freq: Every day | ORAL | 0 refills | Status: DC
  Filled 2024-03-09: qty 14, 14d supply, fill #0

## 2024-03-09 MED ORDER — BENZTROPINE MESYLATE 1 MG PO TABS
1.0000 mg | ORAL_TABLET | Freq: Two times a day (BID) | ORAL | 0 refills | Status: DC
  Filled 2024-03-09: qty 51, 25d supply, fill #0
  Filled 2024-03-09: qty 9, 5d supply, fill #0

## 2024-03-09 MED ORDER — QUETIAPINE FUMARATE 200 MG PO TABS
200.0000 mg | ORAL_TABLET | Freq: Every day | ORAL | 0 refills | Status: DC
  Filled 2024-03-09: qty 30, 30d supply, fill #0

## 2024-03-09 MED ORDER — HYDROXYZINE HCL 50 MG PO TABS
50.0000 mg | ORAL_TABLET | Freq: Three times a day (TID) | ORAL | 0 refills | Status: DC | PRN
  Filled 2024-03-09: qty 90, 30d supply, fill #0

## 2024-03-09 MED ORDER — QUETIAPINE FUMARATE 50 MG PO TABS
50.0000 mg | ORAL_TABLET | Freq: Two times a day (BID) | ORAL | 0 refills | Status: DC
  Filled 2024-03-09: qty 60, 30d supply, fill #0

## 2024-03-09 MED ORDER — ARIPIPRAZOLE ER 400 MG IM SRER
400.0000 mg | INTRAMUSCULAR | 0 refills | Status: DC
  Filled 2024-03-09 – 2024-03-11 (×2): qty 1, 28d supply, fill #0

## 2024-03-09 NOTE — BHH Suicide Risk Assessment (Signed)
 Suicide Risk Assessment  Discharge Assessment    [facility] Discharge Suicide Risk Assessment   Principal Problem: Schizoaffective disorder [facility]) Discharge Diagnoses: Principal Problem:   Schizoaffective disorder (HCC) Active Problems:   Cannabis use disorder, moderate, dependence (HCC)   Cocaine use disorder, severe, dependence (HCC)   MDD (major depressive disorder), recurrent, severe, with psychosis (HCC)   Psychoactive substance-induced organic mood disorder (HCC)   Total Time spent with patient: 45 minutes  Musculoskeletal: Strength & Muscle Tone: within normal limits Gait & Station: unsteady, Patient uses the walker to aid her ambulation/balance. She is ordered a DME will be going going home with a walker.  Patient leans: N/A  Psychiatric Specialty Exam  Presentation  General Appearance:  Casual; Fairly Groomed  Eye Contact: Good  Speech: Clear and Coherent  Speech Volume: Normal  Handedness: Right   Mood and Affect  Mood: -- (Improved.)  Duration of Depression Symptoms: Greater than 1 week. Affect: Congruent   Thought Process  Thought Processes: Coherent; Linear; Goal Directed  Descriptions of Associations:Intact  Orientation:Full (Time, Place and Person)  Thought Content:Logical  History of Schizophrenia/Schizoaffective disorder:Yes  Duration of Psychotic Symptoms:Greater than six months  Hallucinations:Hallucinations: None  Ideas of Reference:None  Suicidal Thoughts:Suicidal Thoughts: No SI Passive Intent and/or Plan: -- (Denies)  Homicidal Thoughts:Homicidal Thoughts: No   Sensorium  Memory: Immediate Good; Recent Good; Remote Good  Judgment: Good  Insight: Good   Executive Functions  Concentration: Good  Attention Span: Good  Recall: Good  Fund of Knowledge: Fair  Language: Good   Psychomotor Activity  Psychomotor Activity: Psychomotor Activity: Other (comment) (Weak gait & balance secondary to two previous  CVAs. She is currently using the walker to aid her gait/balance.)   Assets  Assets: Communication Skills; Desire for Improvement; Housing; Resilience   Sleep  Sleep: Sleep: Good  Estimated Sleeping Duration (Last 24 Hours): 7.50-9.25 hours  Physical Exam: See discharge summary.  Blood pressure (!) 141/74, pulse 63, temperature 98.1 F (36.7 C), temperature source Oral, resp. rate 12, height 5' 4 (1.626 m), weight 66 kg, SpO2 100%. Body mass index is 24.98 kg/m.  Mental Status Per Nursing Assessment::   On Admission:  Suicidal ideation indicated by patient  Demographic Factors:  Age 49 or older, Adolescent or young adult, Low socioeconomic status, Living alone, and Unemployed  Loss Factors: Financial problems/change in socioeconomic status  Historical Factors: NA  Risk Reduction Factors:   Sense of responsibility to family and Positive coping skills or problem solving skills  Continued Clinical Symptoms:  More than one psychiatric diagnosis  Cognitive Features That Contribute To Risk:  Thought constriction (tunnel vision)    Suicide Risk:  Minimal: No identifiable suicidal ideation.  Patients presenting with no risk factors but with morbid ruminations; may be classified as minimal risk based on the severity of the depressive symptoms   Follow-up Information     [facility] Follow up on 03/15/2024.   Why: You have a hospital follow up appointment for therapy and medication management services on 03/15/24 at 9:30 am. The appointment will be Virtual, telehealth. Contact information: [address]  [address] [place] [address] [phone]                Plan Of Care/Follow-up recommendations:  See the discharge recommendation above.  Mac Bolster, NP, [id number]. 03/09/2024, 10:51 AM

## 2024-03-09 NOTE — BHH Counselor (Signed)
 Patient received a walker for ambulation purposes once discharged from [facility]. Walker brought over from [employer] with assistance from [employer] supervisor. Provider and patient made aware.  Norrin Shreffler, LCSWA 03/09/24 11:49AM

## 2024-03-09 NOTE — Progress Notes (Signed)
 Patients Walker for ambulating was delivered

## 2024-03-09 NOTE — Progress Notes (Signed)
 Attempted to recheck patients blood pressure per provider request and patient refused.

## 2024-03-09 NOTE — Transportation (Signed)
 03/09/2024  Kaitlyn Gray DOB: Aug 09, 1975 MRN: [id number]   RIDER WAIVER AND RELEASE OF LIABILITY  For the purposes of helping with transportation needs, [facility] partners with outside transportation providers (taxi companies, [place], Catering manager.) to give [place] patients or other approved people the choice of on-demand rides Public librarian) to our buildings for non-emergency visits.  By using [employer], I, the person signing this document, on behalf of myself and/or any legal minors (in my care using the [employer]), agree:  Science writer given to me are supplied by independent, outside transportation providers who do not work for, or have any affiliation with, [employer]. [place] is not a transportation company. [facility] has no control over the quality or safety of the rides I get using [employer]. [place] has no control over whether any outside ride will happen on time or not. [facility] gives no guarantee on the reliability, quality, safety, or availability on any rides, or that no mistakes will happen. I know and accept that traveling by vehicle (car, truck, SVU, fleeta, bus, taxi, etc.) has risks of serious injuries such as disability, being paralyzed, and death. I know and agree the risk of using [employer] is mine alone, and not [employer]. [employer] are provided as is and as are available. The transportation providers are in charge for all inspections and care of the vehicles used to provide these rides. I agree not to take legal action against [place], its agents, employees, officers, directors, representatives, insurers, attorneys, assigns, successors, subsidiaries, and affiliates at any time for any reasons related directly or indirectly to using [employer]. I also agree not to take legal action against [facility] or its affiliates for any injury, death, or damage to property caused by or related to using  [employer]. I have read this Waiver and Release of Liability, and I understand the terms used in it and their legal meaning. This Waiver is freely and voluntarily given with the understanding that my right (or any legal minors) to legal action against  relating to [employer] is knowingly given up to use these services.   I attest that I read the Ride Waiver and Release of Liability to Kaitlyn Gray, gave Ms. Christiana the opportunity to ask questions and answered the questions asked (if any). I affirm that Kaitlyn Gray then provided consent for assistance with transportation.        Zevin Nevares, LCSWA 03/09/24 11:50am

## 2024-03-09 NOTE — [facility] Suicide Risk Assessment (Signed)
 [facility] INPATIENT:  Family/Significant Other Suicide Prevention Education  Suicide Prevention Education:  Patient Refusal for Family/Significant Other Suicide Prevention Education: The patient Kaitlyn Gray has refused to provide written consent for family/significant other to be provided Family/Significant Other Suicide Prevention Education during admission and/or prior to discharge.  Physician notified.  Tamberly Pomplun M Cray Monnin 03/09/2024, 1:13 PM

## 2024-03-09 NOTE — Progress Notes (Signed)
  [facility] Adult Case Management Discharge Plan :  Will you be returning to the same living situation after discharge:  Yes,  pt returning home after discharge.  At discharge, do you have transportation home?: Yes,  pt will be transported home via taxi voucher.  Do you have the ability to pay for your medications: Yes,  pt is insured - [employer]  Release of information consent forms completed and in the chart;  Patient's signature needed at discharge.  Patient to Follow up at:  Follow-up Information     [facility] Follow up on 03/15/2024.   Why: You have a hospital follow up appointment for therapy and medication management services on 03/15/24 at 9:30 am. The appointment will be Virtual, telehealth. Contact information: [address]  [address] [place] [address] [phone]                 Next level of care provider has access to [facility] Link:no  Safety Planning and Suicide Prevention discussed: Yes,  completed with patient as she did not provide consent to contact family.     Has patient been referred to the Quitline?: Patient refused referral for treatment  Patient has been referred for addiction treatment: Patient refused referral for treatment.  Kaitlyn Gray, LCSWA 03/09/2024, 11:45 AM

## 2024-03-09 NOTE — Progress Notes (Signed)
 Recreation Therapy Notes  INPATIENT RECREATION TR PLAN  Patient Details Name: ACIRE TANG MRN: [id number] DOB: 1975-07-10 Today's Date: 03/09/2024  Rec Therapy Plan Is patient appropriate for Therapeutic Recreation?: Yes Treatment times per week: about 3 days Estimated Length of Stay: 5-7 days TR Treatment/Interventions: Group participation (Comment)  Discharge Criteria Pt will be discharged from therapy if:: Discharged Treatment plan/goals/alternatives discussed and agreed upon by:: Patient/family  Discharge Summary Short term goals set: See patient care plan Short term goals met: Complete Progress toward goals comments: Groups attended Which groups?: Coping skills, Other (Comment) (Team Building; Geophysicist/field seismologist) Reason goals not met: None Therapeutic equipment acquired: N/A Reason patient discharged from therapy: Discharge from hospital Pt/family agrees with progress & goals achieved: Yes Date patient discharged from therapy: 03/09/24   Demon Volante-McCall, LRT,CTRS [place] A Torria Fromer-McCall 03/09/2024, 1:26 PM

## 2024-03-09 NOTE — Progress Notes (Signed)
 Pharmacy Patient Advocate Encounter  Insurance verification completed.   The patient is insured through [employer]   Ran test claim for Abilify  [place]. Co-pay is $4.  This test claim was processed through [facility]- copay amounts may vary at other pharmacies due to pharmacy/plan contracts, or as the patient moves through the different stages of their insurance plan.

## 2024-03-09 NOTE — Discharge Summary (Signed)
 Physician Discharge Summary Note  Patient:  Kaitlyn Gray is an 48 y.o., female  MRN:  [id number]  DOB:  01/26/1976  Patient phone:  [phone] (home)   Patient address:   [address] [place] [address],   Total Time spent with patient: 45 minutes  Date of Admission:  03/03/2024  Date of Discharge: 03-09-24  Reason for Admission: Complaint of passive suicidal ideations without intent, plans or means & inability to move after smoking a bad cocaine.   Principal Problem: Schizoaffective disorder [facility])  Discharge Diagnoses: Principal Problem:   Schizoaffective disorder (HCC) Active Problems:   Cannabis use disorder, moderate, dependence (HCC)   Cocaine use disorder, severe, dependence (HCC)   MDD (major depressive disorder), recurrent, severe, with psychosis (HCC)   Psychoactive substance-induced organic mood disorder (HCC)  Past Psychiatric History: Schizoaffective disorder, bipolar-type.  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) 07/19/2017   Hypertension    Hypertensive emergency 05/31/2022   Ischemic stroke (HCC) 01/01/2020   subacute right middle cerebellar peduncle and pons infarction   Left-sided weakness 01/27/2022   Head CT with remote right occipital infarct which is consistent with old left-sided weakness   MDD (major depressive disorder), recurrent severe, 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   Normocytic  anemia 02/09/2020   Opiate use    History of Suboxone  Therapy until 05/2021   Polysubstance abuse (HCC) 07/19/2017   Prescription drug misadventures (Seroquel )     Past Surgical History:  Procedure Laterality Date   CESAREAN SECTION     Family History:  Family History  Problem Relation Age of Onset   Hypertension Mother    CAD Mother 45       died of MI at age 21   Hypertension Father    Family Psychiatric  History: See H&P.  Social History:  Social History   Substance and Sexual Activity  Alcohol Use Not Currently     Social History   Substance and Sexual Activity  Drug Use Yes   Types: Marijuana, Cocaine, Crack cocaine   Comment: occassionally    Social History   Socioeconomic History   Marital status: Single    Spouse name: Not on file   Number of children: Not on file   Years of education: Not on file   Highest education level: Not on file  Occupational History   Not on file  Tobacco Use   Smoking status: Every Day    Current packs/day: 1.00    Average packs/day: 1 pack/day for 25.7 years (25.7 ttl pk-yrs)    Types: Cigarettes    Start date: 06/16/1998    Passive exposure: Current   Smokeless tobacco: Former  Building services engineer status: Every Day  Substance and Sexual Activity   Alcohol use: Not Currently   Drug use: Yes    Types: Marijuana, Cocaine, Crack cocaine    Comment: occassionally   Sexual activity: Never    Birth control/protection: None  Other Topics Concern   Not on file  Social History Narrative   ** Merged History Encounter **       Social Drivers of Health   Financial Resource Strain: Not on file  Food Insecurity: Food Insecurity Present (03/03/2024)   Hunger Vital Sign    Worried About Running Out of Food in the Last Year: Often true    Ran Out of Food in the Last Year: Often true  Transportation Needs: Unmet Transportation Needs (03/03/2024)   PRAPARE - Administrator, Civil Service (Medical): Yes    Lack of  Transportation (Non-Medical): Yes  Physical Activity: Not on file  Stress: Not on file  Social Connections: Not on file   Hospital Course: (Per admission evaluation notes): 48 year old AA female with hx of substance use disorders & other mental health issues. [facility] with complaint of passive suicidal ideations without intent, plans or means. Patient was apparently evaluated at the [facility] ED for what she described as I could not move after smoking a bad cocaine). Besides medical evaluation, patient was also evaluated by a physical therapist & was recommended to use walker to aid her ambulation & balance. She was later transferred to the [facility] for psychiatric evaluation/treatments. Patient's chart is reviewed including current lab results. She was admitted/treated/discharged from this [facility] in May of this year, 2025. At the time, she was discharged on Abilify  maintena & recommended to follow-up at the [facility] health for medication management & [facility] for counseling services. It was during this evaluation that patient reports that she has not been on her mental health medications in a long time.   This is one of Cynthea's several psychiatric admission/discharge summaries from this [facility]. She is known in this hospital with problems related to chronic mental health issues, noncompliance to recommended treatment regimen & polysubstance use/dependence. She was admitted to this hospital this time with complaint of passive suicidal ideations without intent, plans or means & inability to move after smoking a bad cocaine. Her UDS result on admission was positive for Cocaine & benzodiazepine. Upon her arrival to the [facility] & through out her hospitalization, Tinley did not develop any substance withdrawal symptoms that required detoxification treatments. The focus is on mood stabilization treatments. She was medicated, stabilized & discharged on the  medications as listed below on her discharge medication lists. Zlaty also received/discharged on other medication regimen for her other pre-existing medical issues presented. She tolerated her treatment regimen without any adverse effects or reactions reported.   Besides the mood stabilizations treatments, Emersynn was also enrolled & participated in the group counseling sessions being offered & held on this unit. She learned coping skills that should help her cope better & maintain mood stability/sobriety after discharge. Shefali was treated, stabilized & discharged on 2 separate antipsychotic medications (Abilify  maintena & Seroquel  tablets). This is because her symptoms has failed to respond effectively to an antipsychotic monotherapy. However, the combination therapy of Abilify  injectable & Seroquel  seem to be effective in controlling her symptoms at this time. It will benefit Phillis to continue on these antipsychotic combination therapies to maintain maximum symptom control after discharge. However, as her symptoms continue to improve, she may be titrated down to an antipsychotic monotherapy to reduce the risks of developing metabolic syndrome & other related adverse effects usually associated with use multiple antipsychotic therapies. This has to be done within the proper judgement & evaluation of her  outpatient provider. Chart review reports has failed to show if patient has actually failed two or more antipsychotic therapies in the past. It appeared, there has always been a combination of Seroquel  & Abilify  in managing her symptoms.   Agnieszka's symptoms responded well to her treatment regimen. Her mood is now stabilized. This is evidenced by her reports of improved mood & absence of symptoms. However, she remains with lower extremity weakness due to the effects of post CVA & the need to use walker to aid her gait/balance. Upon discharge, she was provided with her own walker to use at home. She is  currently being discharged to her home to continue routine psychiatric treatment & medication management as noted below. She is provided with all the necessary information required to make these appointments without problems. Her discharge medications were filled at the [facility] outpatient pharmacy & brought to patient prior to her discharge to take home with her. She was able to engage in safety planning including plan to return to [facility] or contact emergency services if she feels unable to maintain her own safety or the safety of others. Pt had no further questions, comments, or concerns. She left [facility] with all personal belongings in no apparent distress. Transportation per taxi. [facility] assisted with the taxi fare.  Physical Findings: AIMS:  , ,  ,  ,  ,  ,   CIWA:    COWS:     Musculoskeletal: Strength & Muscle Tone: within normal limits Gait & Station: Unsteady. Uses walker to aid her ambulation/balance. Is being discharged with a walker. Patient leans: N/A  Psychiatric Specialty Exam:  Presentation  General Appearance:  Casual; Fairly Groomed  Eye Contact: Good  Speech: Clear and Coherent  Speech Volume: Normal  Handedness: Right   Mood and Affect  Mood: -- (Improved.)  Affect: Congruent   Thought Process  Thought Processes: Coherent; Linear; Goal Directed  Descriptions of Associations:Intact  Orientation:Full (Time, Place and Person)  Thought Content:Logical  History of Schizophrenia/Schizoaffective disorder:Yes  Duration of Psychotic Symptoms:Greater than six months  Hallucinations:Hallucinations: None  Ideas of Reference:None  Suicidal Thoughts:Suicidal Thoughts: No SI Passive Intent and/or Plan: -- (Denies)  Homicidal Thoughts:Homicidal Thoughts: No   Sensorium  Memory: Immediate Good; Recent Good; Remote Good  Judgment: Good  Insight: Good   Executive Functions  Concentration: Good  Attention Span: Good  Recall: Good  Fund of  Knowledge: Fair  Language: Good   Psychomotor Activity  Psychomotor Activity: Psychomotor Activity: Other (comment) (Weak gait & balance secondary to two previous CVAs. She is currently using the walker to aid her gait/balance.)  Assets  Assets: Communication Skills; Desire for Improvement; Housing; Resilience  Sleep  Sleep: Sleep: Good  Estimated Sleeping Duration (Last 24 Hours): 7.50-9.25 hours  Physical Exam: Physical Exam Vitals and nursing note reviewed.  HENT:     Head: Normocephalic.     Nose: Nose normal.     Mouth/Throat:     Pharynx: Oropharynx is clear.  Cardiovascular:     Pulses: Normal pulses.     Comments: Hx. HTN.  Patient is on Losartan .  Refused her blood pressure to be rechecked prior to discharge.  However, she denies ani chest pain & or SOB. Pulmonary:     Effort: Pulmonary effort is normal.  Genitourinary:    Comments: Hx. Bladder incontinence.  Uses adult depends. Musculoskeletal:     Cervical back: Normal range of motion.     Comments: Patient have had hx of CVA x 2.  Her gait & balance are currently unsteady. She uses walker to aid her gait/balance. She has an order for DME & will be discharged with a walker.  Skin:    General: Skin is dry.  Neurological:     General: No focal deficit present.     Mental Status: She is alert and oriented to person, place, and time. Mental status is at baseline.    Review of Systems  Constitutional:  Negative for chills, diaphoresis and fever.  HENT:  Negative for congestion and sore throat.   Eyes:  Negative for blurred vision.  Respiratory:  Negative for cough, shortness of breath and wheezing.   Cardiovascular:  Negative for chest pain and palpitations.  Gastrointestinal:  Negative for abdominal pain, constipation, diarrhea, heartburn, nausea and vomiting.  Genitourinary:        Hx. Bladder incontinence.  Uses adult depends.   Musculoskeletal:        Patient have had hx of CVA x 2. Her gait &  balance are currently unsteady. She use walker to aid her gait/balance. She has an order for DME & will be discharged with a walker.   Skin:  Negative for rash.  Neurological:  Positive for weakness (Lower extremity weakness due to hx of CVA x 2..). Negative for dizziness, tingling, tremors, sensory change, speech change, focal weakness and headaches.  Endo/Heme/Allergies:        Allergies: Hydrocodone .   Other: Tomato, Latex.  Psychiatric/Behavioral:  Positive for depression (Hx of (stable on medication).) and substance abuse (Hx of cocaine use disorder.). Negative for hallucinations (Hx of AVH, stable.), memory loss and suicidal ideas. The patient has insomnia (Hx of (stable on medication).). The patient is not nervous/anxious (Stable upon discharge.).    Blood pressure (!) 141/74, pulse 63, temperature 98.1 F (36.7 C), temperature source Oral, resp. rate 12, height 5' 4 (1.626 m), weight 66 kg, SpO2 100%. Body mass index is 24.98 kg/m.   Social History   Tobacco Use  Smoking Status Every Day   Current packs/day: 1.00   Average packs/day: 1 pack/day for 25.7 years (25.7 ttl pk-yrs)   Types: Cigarettes   Start date: 06/16/1998   Passive exposure: Current  Smokeless Tobacco Former   Tobacco Cessation:  N/A, patient does not currently use tobacco products  Blood Alcohol level:  Lab Results  Component Value Date   [facility] <15 03/01/2024   ETH <15 02/28/2024   Metabolic Disorder Labs:  Lab Results  Component Value Date   HGBA1C 5.4 10/23/2023   MPG 108.28 10/23/2023   MPG 134.11 12/23/2022   Lab Results  Component Value Date   PROLACTIN 20.7 12/23/2022   Lab Results  Component Value Date   CHOL 123 10/23/2023   TRIG 56 10/23/2023   HDL 48 10/23/2023   CHOLHDL 2.6 10/23/2023   VLDL 11 10/23/2023   LDLCALC 64 10/23/2023   LDLCALC 95 12/23/2022   See Psychiatric Specialty Exam and Suicide Risk Assessment completed by Attending Physician prior to discharge.  Discharge  destination:  Home  Is patient on multiple antipsychotic therapies at discharge:  Yes,   Do you recommend tapering to monotherapy for antipsychotics?  Yes   Has Patient had three or more failed trials of antipsychotic monotherapy by history:  No, chart review has failed to show any other antipsychotic medications used by patient besides the Abilify  & Seroquel .   Recommended Plan for Multiple Antipsychotic Therapies: Per the outpatient psychiatric provider when her symptoms improve or stabilize. This is to  prevent the development of metabolic syndrome usually associated with use of multiple antipsychotic medications.  Allergies as of 03/09/2024       Reactions   Tomato Anaphylaxis, Other (See Comments)   Pt reports this as an allergy, but has been eating ketchup and pizza on previous visits w/o any s/s of allergic reaction/anaphylaxis.    Hydrocodone  Itching   Latex Itching, Rash        Medication List     TAKE these medications      Indication  ARIPiprazole  20 MG tablet Commonly known as: ABILIFY  Take 1 tablet (20 mg total) by mouth daily. Take 1 tablet by mouth daily x 14 days, then stop: For mood control Start taking on: March 10, 2024  Indication: Mood control.   ARIPiprazole  ER 400 MG Srer injection Commonly known as: ABILIFY  MAINTENA Inject 2 mLs (400 mg total) into the muscle every 28 (twenty-eight) days. (Due on 03-24-24): For mood control Start taking on: March 24, 2024  Indication: Manic-Depression   benztropine  1 MG tablet Commonly known as: COGENTIN  Take 1 tablet (1 mg total) by mouth 2 (two) times daily. For prevention of drug induced tremors.  Indication: Extrapyramidal Reaction caused by Medications   hydrOXYzine  50 MG tablet Commonly known as: ATARAX  Take 1 tablet (50 mg total) by mouth 3 (three) times daily as needed for anxiety. For anxiety  Indication: Feeling Anxious   losartan  25 MG tablet Commonly known as: COZAAR  Take 1 tablet (25 mg total)  by mouth daily. For HTN. Start taking on: March 10, 2024  Indication: High Blood Pressure   metFORMIN  500 MG tablet Commonly known as: GLUCOPHAGE  Take 1 tablet (500 mg total) by mouth 2 (two) times daily with a meal. For diabetes management.  Indication: Type 2 Diabetes   QUEtiapine  200 MG tablet Commonly known as: SEROQUEL  Take 1 tablet (200 mg total) by mouth at bedtime. For mood control  Indication: Mood control.   QUEtiapine  50 MG tablet Commonly known as: SEROQUEL  Take 1 tablet (50 mg total) by mouth 2 (two) times daily. For agitation.  Indication: Agitation               Durable Medical Equipment  (From admission, onward)           Start     Ordered   03/08/24 1651  For home use only DME Walker rolling  Once       Question Answer Comment  Walker: With 5 Inch Wheels   Patient needs a walker to treat with the following condition Polysubstance use disorder   Patient needs a walker to treat with the following condition Status post CVA   Patient needs a walker to treat with the following condition Bipolar II disorder (HCC)      03/08/24 1654            Follow-up Information     [facility] Follow up on 03/15/2024.   Why: You have a hospital follow up appointment for therapy and medication management services on 03/15/24 at 9:30 am. The appointment will be Virtual, telehealth. Contact information: [address]  [address] [place] [address] [phone]                Plan Of Care/Follow-up recommendations:  Activity: as tolerated Diet: heart healthy  Other: -Follow-up with your outpatient psychiatric provider -instructions on appointment date, time, and address (location) are provided to you in discharge paperwork.  -Take your psychiatric medications as prescribed at discharge - instructions are  provided to you in the discharge paperwork  -Follow-up with outpatient primary care doctor and other specialists -for management of  preventative medicine and chronic medical issues  -Testing: Follow-up with outpatient provider for abnormal lab results: NA  -If you are prescribed an atypical antipsychotic medication, we recommend that your outpatient psychiatrist follow routine screening for side effects within 3 months of discharge, including monitoring: AIMS scale, height, weight, blood pressure, fasting lipid panel, HbA1c, and fasting blood sugar.   -Recommend total abstinence from alcohol, tobacco, and other illicit drug use at discharge.   -If your psychiatric symptoms recur, worsen, or if you have side effects to your psychiatric medications, call your outpatient psychiatric provider, 911, [phone] or go to the nearest emergency department.  -If suicidal thoughts occur, immediately call your outpatient psychiatric provider, 911, [phone] or go to the nearest emergency department.  Signed: Mac Bolster, NP, pmhnp, fnp-bc. 03/09/2024, 10:54 AM

## 2024-03-09 NOTE — Plan of Care (Signed)
 Patient was able to identify more than three positive coping skills strategies to use post d/c within five recreation therapy group sessions.   Annalyn Blecher-McCall, LRT,CTRS

## 2024-03-09 NOTE — Group Note (Signed)
 Recreation Therapy Group Note   Group Topic:Team Building  Group Date: 03/09/2024 Start Time: 1010 End Time: 1050 Facilitators: Caitrin Pendergraph-McCall, LRT,CTRS Location: [address] Dayroom   Group Topic: Communication, Team Building, Problem Solving  Goal Area(s) Addresses:  Patient will effectively work with peer towards shared goal.  Patient will identify skills used to make activity successful.  Patient will share challenges and verbalize solution-driven approaches used. Patient will identify how skills used during activity can be used to reach post d/c goals.   Behavioral Response: Engaged  Intervention: STEM Activity   Activity: [employer]. Patients were provided the following materials: 5 drinking straws, 5 rubber bands, 5 paper clips, 2 index cards, 2 drinking cups, and 2 toilet paper rolls. Using the provided materials patients were asked to build a launching mechanism to launch a ping pong ball across the room, approximately 10 feet. Patients were divided into teams of 3-5. Instructions required all materials be incorporated into the device, functionality of items left to the peer group's discretion.  Education: Pharmacist, community, Scientist, physiological, Air cabin crew, Building control surveyor.   Education Outcome: Acknowledges education/In group clarification offered/Needs additional education.    Affect/Mood: Appropriate   Participation Level: Engaged   Participation Quality: Independent   Behavior: Appropriate   Speech/Thought Process: Focused   Insight: Good   Judgement: Good   Modes of Intervention: STEM Activity   Patient Response to Interventions:  Engaged   Education Outcome:  In group clarification offered    Clinical Observations/Individualized Feedback: While waiting on peers to come to group, pt expressed she not feeling comfortable going home. Pt explained she felt that way because [name] lost her belongings and her phone was in there. Pt also talked  about not having anything to do when she goes home and not having any friends because all of her old friends just get high all the time and she didn't want to engage with them. LRT asked pt if she attended AA/NA meetings, pt expressed not knowing at any because she has been in the hospital and hasn't had a chance to find any. LRT also encouraged pt to look into places like a community center that offers different activities she may be interested in. This will give her to opportunity to meet new people and occupy her time in a positive way. During the activity, pt started out working with two of her peers but then decided to work on her own. Pt started off good but later gave up because she wasn't able to complete it.    Plan: Continue to engage patient in RT group sessions 2-3x/week.   Kaitlyn Gray, LRT,CTRS 03/09/2024 12:53 PM

## 2024-03-09 NOTE — Progress Notes (Signed)
 Patient being discharged. Patient denies SI/HI still endorses AVH but reports manageable and discharging provider aware. Patient given and educated on all required discharge paperwork and verbalized understanding.  Patient educated on discharge medications and follow up appointments and verbalized understanding. Patient given medications and walker.Patients belongings returned and patient escorted to ride.

## 2024-03-11 ENCOUNTER — Other Ambulatory Visit: Payer: Self-pay

## 2024-03-11 NOTE — Progress Notes (Signed)
 Specialty Pharmacy Initial Fill Coordination Note  Kaitlyn Gray is a 48 y.o. female contacted today regarding initial fill of specialty medication(s) ARIPiprazole  (ABILIFY , ABILIFY  MAINTENA)   Patient requested Delivery   Delivery date: 03/15/24   Verified address: [facility]   Medication will be filled on 9/29.   Patient is aware of $4 copayment.

## 2024-03-14 ENCOUNTER — Other Ambulatory Visit: Payer: Self-pay

## 2024-03-24 ENCOUNTER — Emergency Department (HOSPITAL_COMMUNITY)
Admission: EM | Admit: 2024-03-24 | Discharge: 2024-03-24 | Disposition: A | Discharge status: Home / Self Care | Payer: MEDICAID | Attending: Emergency Medicine | Admitting: Emergency Medicine

## 2024-03-24 ENCOUNTER — Other Ambulatory Visit: Payer: Self-pay

## 2024-03-24 ENCOUNTER — Encounter (HOSPITAL_COMMUNITY): Payer: Self-pay | Admitting: Emergency Medicine

## 2024-03-24 ENCOUNTER — Emergency Department (HOSPITAL_COMMUNITY): Payer: MEDICAID

## 2024-03-24 DIAGNOSIS — Z79899 Other long term (current) drug therapy: Secondary | ICD-10-CM | POA: Insufficient documentation

## 2024-03-24 DIAGNOSIS — R0789 Other chest pain: Secondary | ICD-10-CM | POA: Diagnosis present

## 2024-03-24 DIAGNOSIS — Z91040 Latex allergy status: Secondary | ICD-10-CM | POA: Diagnosis not present

## 2024-03-24 DIAGNOSIS — I10 Essential (primary) hypertension: Secondary | ICD-10-CM | POA: Insufficient documentation

## 2024-03-24 DIAGNOSIS — R059 Cough, unspecified: Secondary | ICD-10-CM | POA: Diagnosis not present

## 2024-03-24 DIAGNOSIS — F1420 Cocaine dependence, uncomplicated: Secondary | ICD-10-CM | POA: Diagnosis not present

## 2024-03-24 DIAGNOSIS — Z765 Malingerer [conscious simulation]: Secondary | ICD-10-CM | POA: Diagnosis not present

## 2024-03-24 DIAGNOSIS — R112 Nausea with vomiting, unspecified: Secondary | ICD-10-CM | POA: Insufficient documentation

## 2024-03-24 DIAGNOSIS — R45851 Suicidal ideations: Secondary | ICD-10-CM | POA: Diagnosis not present

## 2024-03-24 DIAGNOSIS — E119 Type 2 diabetes mellitus without complications: Secondary | ICD-10-CM | POA: Diagnosis not present

## 2024-03-24 DIAGNOSIS — F1410 Cocaine abuse, uncomplicated: Secondary | ICD-10-CM | POA: Diagnosis present

## 2024-03-24 LAB — CBC
HCT: 37 % (ref 36.0–46.0)
Hemoglobin: 10.9 g/dL — ABNORMAL LOW (ref 12.0–15.0)
MCH: 23.4 pg — ABNORMAL LOW (ref 26.0–34.0)
MCHC: 29.5 g/dL — ABNORMAL LOW (ref 30.0–36.0)
MCV: 79.6 fL — ABNORMAL LOW (ref 80.0–100.0)
Platelets: 328 K/uL (ref 150–400)
RBC: 4.65 MIL/uL (ref 3.87–5.11)
RDW: 21.8 % — ABNORMAL HIGH (ref 11.5–15.5)
WBC: 4.6 K/uL (ref 4.0–10.5)
nRBC: 0 % (ref 0.0–0.2)

## 2024-03-24 LAB — BASIC METABOLIC PANEL WITH GFR
Anion gap: 11 (ref 5–15)
BUN: 12 mg/dL (ref 6–20)
CO2: 22 mmol/L (ref 22–32)
Calcium: 9.3 mg/dL (ref 8.9–10.3)
Chloride: 106 mmol/L (ref 98–111)
Creatinine, Ser: 0.89 mg/dL (ref 0.44–1.00)
GFR, Estimated: >60 mL/min (ref >60)
Glucose, Bld: 89 mg/dL (ref 70–99)
Potassium: 3.5 mmol/L (ref 3.5–5.1)
Sodium: 139 mmol/L (ref 135–145)

## 2024-03-24 LAB — RAPID URINE DRUG SCREEN, HOSP PERFORMED
Amphetamines: NOT DETECTED
Barbiturates: NOT DETECTED
Benzodiazepines: NOT DETECTED
Cocaine: POSITIVE — AB
Opiates: NOT DETECTED
Tetrahydrocannabinol: POSITIVE — AB

## 2024-03-24 LAB — HEPATIC FUNCTION PANEL
ALT: 16 U/L (ref 0–44)
AST: 17 U/L (ref 15–41)
Albumin: 3.3 g/dL — ABNORMAL LOW (ref 3.5–5.0)
Alkaline Phosphatase: 53 U/L (ref 38–126)
Bilirubin, Direct: <0.1 mg/dL (ref 0.0–0.2)
Total Bilirubin: 0.4 mg/dL (ref 0.0–1.2)
Total Protein: 6.9 g/dL (ref 6.5–8.1)

## 2024-03-24 LAB — TROPONIN I (HIGH SENSITIVITY)
Troponin I (High Sensitivity): 5 ng/L (ref <18)
Troponin I (High Sensitivity): 6 ng/L (ref <18)

## 2024-03-24 LAB — ETHANOL: Alcohol, Ethyl (B): <15 mg/dL (ref <15)

## 2024-03-24 LAB — HCG, SERUM, QUALITATIVE: Preg, Serum: NEGATIVE

## 2024-03-24 NOTE — ED Notes (Signed)
 This RN searched pt's bag in front of her, no meds present in the bag, only a toothbrush and toothpaste.

## 2024-03-24 NOTE — Consult Note (Addendum)
 [facility] Psychiatric Consult Initial  Patient Name: .Kaitlyn Gray  MRN: [id number]  DOB: 31-Jan-1976  Consult Order details:  Orders (From admission, onward)     Start     Ordered   03/24/24 0604  CONSULT TO CALL ACT TEAM       Ordering Provider: Bari Charmaine FALCON, MD  Provider:  (Not yet assigned)  Question:  Reason for Consult?  Answer:  Psych consult   03/24/24 0603             Mode of Visit: In person    Psychiatry Consult Evaluation  Service Date: March 24, 2024 LOS:  LOS: 0 days  Chief Complaint: Suicidal ideations   Primary Psychiatric Diagnoses    Malingering 2.     Cocaine use disorder, severe, dependence (HCC)  Assessment   Kaitlyn Gray is a 48 y.o. 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  Patient is currently voluntary this time, as well as medically clear, per EDP team.  Upon evaluation, patient's presentation is most consistent with malingering in the context of secondary gain for temporary housing and cocaine use disorder, severe, dependence.  Evidence of this is appreciable from evaluation conducted, where patient endorses that in the context of a need for temporary shelter and housing and substance abuse, she has presented to the emergency department for secondary gain. This is appreciably congruent to previous encounters the patient has had with the behavioral health team, including this provider on numerous occasions, giving support  to the patient's claims and endorsements.  Given evaluation investigation conducted, there is no evidence that the patient is an imminent risk to herself or others, decompensated into psychosis, with current intoxication or concerning withdrawal, with lacking of capacity, and/or ultimately unsafe outside of the safe and secure environment in the hospital.   The patient is more appropriate for a lesser level of care, but patient's prognosis, like many other numerous occasions, remains guarded, due to the patient's desires to repeatedly inappropriately utilize emergency services, versus addressing her substance abuse/mental health in any serious manor.  Patient endorses no desire to address her substance abuse or mental health, but rather to discharge now that she has gotten enough adequate sleep, as she expresses desire to continue using illicit substances, thus the recommendation of this time is for psychiatric clearance, as well as additional recommendations listed below.  Spoke with Dr. Goli is in agreement with recommendation for psychiatric clearance, as well as additional recommendations listed below.  Diagnoses:  Active Hospital problems: Principal Problem:   Malingering Active Problems:   Cocaine use disorder, severe, dependence (HCC)    Plan   #malingering #Cocaine use disorder, severe, dependence (HCC)  ## Psychiatric Recommendations:   - Recommend safety return precautions - Recommend patient consider abstaining from illicit substance use endorsed to be utilizing - Recommend patient consider close outpatient follow-up with outpatient mental health and substance abuse resources  ## Medical Decision Making Capacity: Has capacity  ## Further Work-up: None at this time  ## Disposition:-- There are no psychiatric contraindications to discharge at this time  ## Behavioral / Environmental: -Routine agitation/safety precautions until  discharge; safety return precautions upon  discharge    ## Safety and Observation Level:  - Based on my clinical evaluation, I estimate the patient to be at low risk of self harm in the current setting and upon recommendation for discharge. - At this time, we recommend  routine. This decision is based on my review of the chart including patient's history and current presentation, interview of the patient, mental status examination, and consideration of suicide risk including evaluating suicidal ideation, plan, intent, suicidal or self-harm behaviors, risk factors, and protective factors. This judgment is based on our ability to directly address suicide risk, implement suicide prevention strategies, and develop a safety plan while the patient is in the clinical setting. Please contact our team if there is a concern that risk level has changed.  CSSR Risk Category:C-SSRS RISK CATEGORY: No Risk  Suicide Risk Assessment: Patient has following modifiable risk factors for suicide: social isolation, recklessness, medication noncompliance, triggering events, and recent psychiatric hospitalization, which we are addressing by recommendations. Patient has following non-modifiable or demographic risk factors for suicide: history of suicide attempt, history of self harm behavior, and psychiatric hospitalization Patient has the following protective factors against suicide: Access to outpatient mental health care and Frustration tolerance  Thank you for this consult request. Recommendations have been communicated to the primary team.  We will sign off at this time.   Jerel JINNY Gravely, NP       History of Present Illness   Kaitlyn Gray is a 48 y.o. 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  Patient is currently voluntary this time, as well as medically clear, per EDP team.  Patient seen today at the [facility] emergency department for face-to-face psychiatric evaluation.  Patient is extremely well-known to this provider, as well as this provider's colleagues in the behavioral health service line.  Upon evaluation, patient presents with an irritable interpersonal style, abrupt and curt speech, and expressed limited desire to participate in examination with this provider.  Patient endorses curtly she came in for concerns for chest pain in the context of recent heavy crack cocaine abuse, but now that she has had time to get some restful sleep, she is ready to go, and endorses that she does not want to answer the same questions you asked me every time.  Patient endorses, without this provider asking, that she is not suicidal or homicidal, and/or hearing or seeing things, and that she is ready to go because, I just want to bounce, you ain't gonna do anything for me, I already got what I needed, I just needed some sleep, plus you never do anything for me.   Patient attempted to be asked a variety of other questions, but patient declined, thus the patient was educated that she would be recommended for psychiatric clearance, as well as additional recommendations listed above.  Patient presents with grossly intact orientation, without concerns for fluctuations in consciousness.  Patient presents with no concerns for lack of capacity, intoxication from endorsed illicit substances the patient has been utilizing, concerning withdrawal symptoms, and/or concerns for decompensation into acute psychosis.  Review of  Systems  Unable to  perform ROS: Other (Non-complaince)  Psychiatric/Behavioral:  Positive for substance abuse (Crack cocaine). Negative for suicidal ideas.      Psychiatric and Social History  Psychiatric History:  Information collected from chart review  Prev Dx/Sx: As above Current Psych Provider: None Home Meds (current): None Previous Med Trials: See chart Therapy: None  Prior Psych Hospitalization: Multiple Prior Self Harm: Yes, multiple Prior Violence: No  Family Psych History: None Family Hx suicide: None  Social History:  Developmental Hx: None reported Educational Hx: None reported Occupational Hx: SSDI  Legal Hx: Multiple IVC Living Situation: Has paid for housing Spiritual Hx: None reported Access to weapons/lethal means: None endorsed  Substance History Alcohol: Denies  Tobacco: Daily Illicit drugs: Crack cocaine, polysubstance abuse Prescription drug abuse: Denies Rehab hx: Multiple  Exam Findings  Physical Exam: As below  Vital Signs:  Temp:  [98.7 F (37.1 C)] 98.7 F (37.1 C) (10/09 0204) Pulse Rate:  [88] 88 (10/09 0204) Resp:  [16] 16 (10/09 0204) BP: (140)/(103) 140/103 (10/09 0204) SpO2:  [100 %] 100 % (10/09 0204) Weight:  [66 kg] 66 kg (10/09 0130) Blood pressure (!) 140/103, pulse 88, temperature 98.7 F (37.1 C), resp. rate 16, weight 66 kg, SpO2 100%. Body mass index is 24.98 kg/m.  Physical Exam Vitals and nursing note reviewed.  Constitutional:      General: She is not in acute distress.    Appearance: She is normal weight. She is not ill-appearing, toxic-appearing or diaphoretic.     Comments: Irritable interpersonal style  Pulmonary:     Effort: Pulmonary effort is normal.  Neurological:     Mental Status: She is alert and oriented to person, place, and time.     Motor: No tremor or seizure activity.  Psychiatric:        Attention and Perception: Perception normal. She does not perceive auditory or visual hallucinations.        Mood and  Affect: Affect is angry.        Behavior: Behavior is uncooperative and agitated.        Thought Content: Thought content normal. Thought content does not include homicidal or suicidal ideation.        Judgment: Judgment is inappropriate.     Comments: Cognition and memory: Grossly intact Speech: Abrupt and curt Mood: Congruent      Mental Status Exam: General Appearance: Disheveled and malodorous African-American female who looks older than stated age in scrubs with irritable interpersonal style  Orientation:  Full (Time, Place, and Person)  Memory:  Grossly intact  Concentration: Brief  Recall:  Grossly intact  Attention: brief  Eye Contact:  Minimal  Speech:  Abrupt and curt  Language:  Unremarkable  Volume:  Normal  Mood: Agitated  Affect:  Congruent  Thought Process:  Goal Directed  Thought Content:  Logical  Suicidal Thoughts:  No  Homicidal Thoughts:  No  Judgement:  Intact, but inappropriate  Insight:  Lacking  Psychomotor Activity:  Normal  Akathisia:  No  Fund of Knowledge:  Grossly intact      Assets:  Architect Housing Leisure Time Resilience Social Support Talents/Skills Transportation Vocational/Educational  Cognition:  WNL  ADL's:  Impaired  AIMS (if indicated):   0     Other History   These have been pulled in through the EMR, reviewed, and updated if appropriate.  Family History:  The patient's family history includes CAD (age of onset: 37) in her mother;  Hypertension in her father and mother.  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) 07/19/2017   Hypertension    Hypertensive emergency 05/31/2022   Ischemic stroke (HCC) 01/01/2020   subacute right middle cerebellar peduncle and pons infarction   Left-sided weakness 01/27/2022   Head CT with remote right occipital infarct which is consistent with old left-sided weakness   MDD (major depressive disorder), recurrent severe, 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  Current Outpatient Medications:    ARIPiprazole  (ABILIFY ) 20 MG tablet, Take 1 tablet (20 mg total) by mouth daily. Take 1 tablet by mouth daily x 14 days, then stop: For mood control, Disp: 14 tablet, Rfl: 0   ARIPiprazole  ER (ABILIFY  MAINTENA) 400 MG SRER injection, Inject 2 mLs (400 mg total) into the muscle every 28 (twenty-eight) days. (Due on 03-24-24): For mood control, Disp: 1 each, Rfl: 0   benztropine  (COGENTIN ) 1 MG tablet, Take 1 tablet (1 mg total) by mouth 2 (two) times daily. For prevention of drug induced tremors., Disp: 60 tablet, Rfl: 0   hydrOXYzine  (ATARAX ) 50 MG tablet, Take 1 tablet (50 mg total) by mouth 3 (three) times daily as needed for anxiety., Disp: 90 tablet, Rfl: 0   losartan  (COZAAR ) 25 MG tablet, Take 1 tablet (25 mg total) by mouth daily. For hypertension., Disp: 30 tablet, Rfl: 0   metFORMIN  (GLUCOPHAGE ) 500 MG tablet, Take 1 tablet (500 mg total) by mouth 2 (two) times daily with a meal. For diabetes management., Disp: 60 tablet, Rfl: 0   QUEtiapine  (SEROQUEL ) 200 MG  tablet, Take 1 tablet (200 mg total) by mouth at bedtime. For mood control, Disp: 30 tablet, Rfl: 0   QUEtiapine  (SEROQUEL ) 50 MG tablet, Take 1 tablet (50 mg total) by mouth 2 (two) times daily. For agitation., Disp: 60 tablet, Rfl: 0  Allergies: Allergies  Allergen Reactions   Tomato Anaphylaxis and Other (See Comments)    Pt reports this as an allergy, but has been eating ketchup and pizza on previous visits w/o any s/s of allergic reaction/anaphylaxis.    Hydrocodone  Itching   Latex Itching and Rash    Jerel JINNY Gravely, NP

## 2024-03-24 NOTE — ED Triage Notes (Signed)
 Pt in from home via [facility], states she woke ago with cp. Hx 4 CVA's. 324mg  ASA given en route, it was then vomited it up. HR 50's. Pt had verbal disagreement with EMS on the way over, began to make statement saying she was going to overdose on her prescription meds that she brought with her.  VS: 170/100 55HR 96%RA 16RR

## 2024-03-24 NOTE — ED Provider Notes (Signed)
 [place] EMERGENCY DEPARTMENT AT [facility] Provider Note   CSN: [id number] Arrival date & time: 03/24/24  9876     Patient presents with: No chief complaint on file.   Kaitlyn Gray is a 48 y.o. female.   HPI     This is a 48 year old female with history of schizophrenia who presents with initially chest pain.  Reports sharp chest pain that started tonight.  Upon arrival to the emergency department, she then endorsed suicidal ideation stating that she would take additional Seroquel  which she brought with her.  She states I just cannot live like this anymore.  Has had similar presentations with suicidal ideation in the past.  Was recently admitted in September for the same.  Regarding her chest pain.  She reports that sharp.  She has had a cough but no shortness of breath.  Reports some nausea and vomiting.  She is vague about substance abuse.  Initially nodded her head yes but then stated that she had not used alcohol or drugs.  Prior to Admission medications   Medication Sig Start Date End Date Taking? Authorizing Provider  ARIPiprazole  (ABILIFY ) 20 MG tablet Take 1 tablet (20 mg total) by mouth daily. Take 1 tablet by mouth daily x 14 days, then stop: For mood control 03/10/24   Collene Gouge I, NP  ARIPiprazole  ER (ABILIFY  MAINTENA) 400 MG SRER injection Inject 2 mLs (400 mg total) into the muscle every 28 (twenty-eight) days. (Due on 03-24-24): For mood control 03/24/24   Collene Gouge I, NP  benztropine  (COGENTIN ) 1 MG tablet Take 1 tablet (1 mg total) by mouth 2 (two) times daily. For prevention of drug induced tremors. 03/09/24   Collene Gouge I, NP  hydrOXYzine  (ATARAX ) 50 MG tablet Take 1 tablet (50 mg total) by mouth 3 (three) times daily as needed for anxiety. 03/09/24   Collene Gouge I, NP  losartan  (COZAAR ) 25 MG tablet Take 1 tablet (25 mg total) by mouth daily. For hypertension. 03/10/24   Collene Gouge I, NP  metFORMIN  (GLUCOPHAGE ) 500 MG tablet Take 1 tablet (500  mg total) by mouth 2 (two) times daily with a meal. For diabetes management. 03/09/24   Collene Gouge I, NP  QUEtiapine  (SEROQUEL ) 200 MG tablet Take 1 tablet (200 mg total) by mouth at bedtime. For mood control 03/09/24   Collene Gouge I, NP  QUEtiapine  (SEROQUEL ) 50 MG tablet Take 1 tablet (50 mg total) by mouth 2 (two) times daily. For agitation. 03/09/24   Collene Gouge I, NP    Allergies: Tomato, Hydrocodone , and Latex    Review of Systems  Constitutional:  Negative for fever.  Respiratory:  Positive for cough. Negative for shortness of breath.   Cardiovascular:  Positive for chest pain.  Gastrointestinal:  Positive for nausea and vomiting. Negative for abdominal pain.  Psychiatric/Behavioral:  Positive for suicidal ideas.   All other systems reviewed and are negative.   Updated Vital Signs BP (!) 140/103   Pulse 88   Temp 98.7 F (37.1 C)   Resp 16   Wt 66 kg   SpO2 100%   BMI 24.98 kg/m   Physical Exam Vitals and nursing note reviewed.  Constitutional:      Appearance: She is well-developed.     Comments: Disheveled, non-ill-appearing  HENT:     Head: Normocephalic and atraumatic.     Mouth/Throat:     Mouth: Mucous membranes are moist.  Eyes:     Pupils: Pupils are equal, round,  and reactive to light.  Cardiovascular:     Rate and Rhythm: Normal rate and regular rhythm.     Heart sounds: Normal heart sounds.  Pulmonary:     Effort: Pulmonary effort is normal. No respiratory distress.     Breath sounds: No wheezing.  Abdominal:     Palpations: Abdomen is soft.  Musculoskeletal:     Cervical back: Neck supple.  Skin:    General: Skin is warm and dry.  Neurological:     Mental Status: She is alert and oriented to person, place, and time.  Psychiatric:     Comments: Flat, does not make eye contact     (all labs ordered are listed, but only abnormal results are displayed) Labs Reviewed  CBC - Abnormal; Notable for the following components:      Result Value    Hemoglobin 10.9 (*)    MCV 79.6 (*)    MCH 23.4 (*)    MCHC 29.5 (*)    RDW 21.8 (*)    All other components within normal limits  HEPATIC FUNCTION PANEL - Abnormal; Notable for the following components:   Albumin 3.3 (*)    All other components within normal limits  BASIC METABOLIC PANEL WITH GFR  HCG, SERUM, QUALITATIVE  ETHANOL  RAPID URINE DRUG SCREEN, HOSP PERFORMED  LIPASE, BLOOD  TROPONIN I (HIGH SENSITIVITY)  TROPONIN I (HIGH SENSITIVITY)    EKG: None  Radiology: DG Chest 2 View Result Date: 03/24/2024 CLINICAL DATA:  Chest pain EXAM: CHEST - 2 VIEW COMPARISON:  02/27/2013 FINDINGS: Cardiac shadow is enlarged but stable. The lungs are well aerated bilaterally. No focal infiltrate or effusion is seen. No bony abnormality is noted. IMPRESSION: No acute abnormality noted. Electronically Signed   By: Oneil Devonshire M.D.   On: 03/24/2024 01:46     Procedures   Medications Ordered in the ED - No data to display  Clinical Course as of 03/24/24 0604  Thu Mar 24, 2024  0603 Labs reviewed.  Patient medically cleared for TTS evaluation [CH]    Clinical Course User Index [CH] Tyson Masin, Charmaine FALCON, MD                                 Medical Decision Making Amount and/or Complexity of Data Reviewed Labs: ordered. Radiology: ordered.   This patient presents to the ED for concern of chest pain, this involves an extensive number of treatment options, and is a complaint that carries with it a high risk of complications and morbidity.  I considered the following differential and admission for this acute, potentially life threatening condition.  The differential diagnosis includes ACS, PE, pneumothorax, pneumonia, chest wall pain  MDM:    This is a 48 year old female who presents with chest pain.  She is nontoxic and vital signs are reassuring.  She also endorses SI.  Recent hospitalization with similar presentation and she was admitted to behavioral health.  EKG shows no evidence  of acute ischemia or arrhythmia.  Troponin negative.  Chest x-ray without pneumothorax or pneumonia.  Basic lab work is reassuring.  Suspect her SI may be chronic and/or situational; however, patient will not contract for safety.  Given recent hospitalization, will have TTS evaluate.  (Labs, imaging, consults)  Labs: I Ordered, and personally interpreted labs.  The pertinent results include:  CBC, BMP, lipase, hepatic function, hCG, troponin  Imaging Studies ordered: I ordered imaging studies including chest  x-ray I independently visualized and interpreted imaging. I agree with the radiologist interpretation  Additional history obtained from chart review.  External records from outside source obtained and reviewed including prior evaluations and behavioral health notes  Cardiac Monitoring: The patient was not maintained on a cardiac monitor.  If on the cardiac monitor, I personally viewed and interpreted the cardiac monitored which showed an underlying rhythm of: N/A  Reevaluation: After the interventions noted above, I reevaluated the patient and found that they have :stayed the same  Social Determinants of Health:  lives independently, polysubstance abuse history  Disposition: Pending TTS  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    Encounter for assessment of healthcare decision-making capacity    History of cervical fracture 12/24/2017   nondisplaced fracture lateral mass of C1 on the right on CT 12/24/17   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) 07/19/2017   Hypertension    Hypertensive emergency 05/31/2022   Ischemic stroke  (HCC) 01/01/2020   subacute right middle cerebellar peduncle and pons infarction   Left-sided weakness 01/27/2022   Head CT with remote right occipital infarct which is consistent with old left-sided weakness   MDD (major depressive disorder), recurrent severe, 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   I have reviewed the patients home medicines and have made adjustments as needed  Problem List / ED Course: Problem List Items Addressed This Visit       Other   Suicidal ideation   Other Visit Diagnoses       Atypical chest pain    -  Primary                Final diagnoses:  Atypical chest pain  Suicidal ideation    ED Discharge Orders     None          Bari Charmaine FALCON, MD 03/24/24 [phone]

## 2024-03-24 NOTE — ED Provider Triage Note (Cosign Needed Addendum)
 Emergency Medicine Provider Triage Evaluation Note  AUTUMM HATTERY , a 48 y.o. female  was evaluated in triage.  Pt complains of substernal chest pain, sharp in nature, that awakened her from her sleep prior to arrival.  She also complains of shortness of breath and coughing up greenish sputum.  She denies abdominal pain, nausea, vomiting. Patient did not endorse SI to me.   Review of Systems  Positive:  Negative:   Physical Exam  Wt 66 kg   BMI 24.98 kg/m  Gen:   Awake, no distress   Resp:  Normal effort  MSK:   Moves extremities without difficulty  Other:    Medical Decision Making  Medically screening exam initiated at 2:27 AM.  Appropriate orders placed.  ZANYLA KLEBBA was informed that the remainder of the evaluation will be completed by another provider, this initial triage assessment does not replace that evaluation, and the importance of remaining in the ED until their evaluation is complete.     Logan Ubaldo NOVAK, PA-C 03/24/24 0232    Logan Ubaldo NOVAK, PA-C 03/24/24 9766   Patient apparently endorsed taking more seroquel  than prescribed while here in the emergency department.     Logan Ubaldo NOVAK, PA-C 03/24/24 [phone]

## 2024-03-24 NOTE — ED Provider Notes (Signed)
 Emergency Medicine Observation Re-evaluation Note  [name] is a 48 y.o. female, seen on rounds today.  Pt initially presented to the ED for complaints of No chief complaint on file. Currently, the patient is resting.  Physical Exam  BP (!) 140/103   Pulse 88   Temp 98.7 F (37.1 C)   Resp 16   Wt 66 kg   SpO2 100%   BMI 24.98 kg/m  Physical Exam General: NAD   ED Course / MDM  EKG:   I have reviewed the labs performed to date as well as medications administered while in observation.  Recent changes in the last 24 hours include none .  Plan    Patient initially presented because of chest pain as well as SI.  Patient is been cleared from a cardiac standpoint.  Negative troponin x 2 as well as negative EKG.  No risk factors for DVT or PE.   Pending psychiatric consult.  Psych saw the patient.  Gave patient outpatient resources.  Recommend stop using cocaine.  No current risk to self-harm.  Discharged in stable addition.  Reassessed the patient.  No SI or HI   Kaitlyn [name], MD 03/24/24 1151

## 2024-03-24 NOTE — ED Notes (Signed)
 Pt informed we still need a urine sample. Pt stated they were unable to at this time.

## 2024-03-24 NOTE — ED Notes (Addendum)
 Pt was given clean blue scrubs to put on before d/c, all belongings returned, d/c v/s being obtained & she refused the BP. D/c papers given & resources explained, pt prepared to leave mad she was only given one bus pass.

## 2024-03-24 NOTE — ED Notes (Signed)
 When approached to ask for a urine sample pt denied needing to go & was getting irritated when being asked. Will try again at another time for the ordered UA.

## 2024-03-24 NOTE — ED Notes (Signed)
 Pt is requesting to lie down somewhere, advised no stretchers available at this time. Pt appears upset, wishes to go to the lobby. Explained to pt she needs to be MSE'd by provider first

## 2024-03-24 NOTE — Discharge Instructions (Addendum)
 Please consider abstaining from illicit substance use Please consider close outpatient follow-up with mental health and substance abuse resources given upon discharge Please adhere to safety return precautions discussed today

## 2024-03-24 NOTE — ED Notes (Signed)
 Pt belongings placed in locker #6 in purple

## 2024-03-25 LAB — LIPASE, BLOOD: Lipase: 116 U/L — ABNORMAL HIGH (ref 11–51)

## 2024-03-29 ENCOUNTER — Other Ambulatory Visit: Payer: Self-pay

## 2024-04-04 ENCOUNTER — Other Ambulatory Visit: Payer: Self-pay

## 2024-04-04 ENCOUNTER — Other Ambulatory Visit (HOSPITAL_COMMUNITY): Payer: Self-pay

## 2024-04-07 ENCOUNTER — Other Ambulatory Visit: Payer: Self-pay

## 2024-04-07 ENCOUNTER — Other Ambulatory Visit (HOSPITAL_COMMUNITY): Payer: Self-pay

## 2024-04-11 ENCOUNTER — Other Ambulatory Visit: Payer: Self-pay

## 2024-04-24 ENCOUNTER — Emergency Department (HOSPITAL_COMMUNITY)
Admission: EM | Admit: 2024-04-24 | Discharge: 2024-04-25 | Disposition: A | Discharge status: Other Acute Inpatient Hospital | Payer: MEDICAID | Attending: Emergency Medicine | Admitting: Emergency Medicine

## 2024-04-24 ENCOUNTER — Other Ambulatory Visit: Payer: Self-pay

## 2024-04-24 ENCOUNTER — Encounter (HOSPITAL_COMMUNITY): Payer: Self-pay | Admitting: *Deleted

## 2024-04-24 DIAGNOSIS — I10 Essential (primary) hypertension: Secondary | ICD-10-CM | POA: Insufficient documentation

## 2024-04-24 DIAGNOSIS — F259 Schizoaffective disorder, unspecified: Secondary | ICD-10-CM | POA: Diagnosis not present

## 2024-04-24 DIAGNOSIS — F418 Other specified anxiety disorders: Secondary | ICD-10-CM | POA: Diagnosis not present

## 2024-04-24 DIAGNOSIS — R45851 Suicidal ideations: Secondary | ICD-10-CM | POA: Insufficient documentation

## 2024-04-24 DIAGNOSIS — E119 Type 2 diabetes mellitus without complications: Secondary | ICD-10-CM | POA: Insufficient documentation

## 2024-04-24 DIAGNOSIS — F1420 Cocaine dependence, uncomplicated: Secondary | ICD-10-CM | POA: Insufficient documentation

## 2024-04-24 DIAGNOSIS — F1994 Other psychoactive substance use, unspecified with psychoactive substance-induced mood disorder: Secondary | ICD-10-CM | POA: Diagnosis present

## 2024-04-24 DIAGNOSIS — F1410 Cocaine abuse, uncomplicated: Secondary | ICD-10-CM | POA: Diagnosis present

## 2024-04-24 DIAGNOSIS — F250 Schizoaffective disorder, bipolar type: Secondary | ICD-10-CM | POA: Diagnosis present

## 2024-04-24 LAB — COMPREHENSIVE METABOLIC PANEL WITH GFR
ALT: 20 U/L (ref 0–44)
AST: 17 U/L (ref 15–41)
Albumin: 3.3 g/dL — ABNORMAL LOW (ref 3.5–5.0)
Alkaline Phosphatase: 58 U/L (ref 38–126)
Anion gap: 9 (ref 5–15)
BUN: 11 mg/dL (ref 6–20)
CO2: 27 mmol/L (ref 22–32)
Calcium: 8.8 mg/dL — ABNORMAL LOW (ref 8.9–10.3)
Chloride: 104 mmol/L (ref 98–111)
Creatinine, Ser: 0.84 mg/dL (ref 0.44–1.00)
GFR, Estimated: >60 mL/min (ref >60)
Glucose, Bld: 137 mg/dL — ABNORMAL HIGH (ref 70–99)
Potassium: 3.4 mmol/L — ABNORMAL LOW (ref 3.5–5.1)
Sodium: 140 mmol/L (ref 135–145)
Total Bilirubin: 0.5 mg/dL (ref 0.0–1.2)
Total Protein: 6.9 g/dL (ref 6.5–8.1)

## 2024-04-24 LAB — CBC
HCT: 31.4 % — ABNORMAL LOW (ref 36.0–46.0)
Hemoglobin: 9.3 g/dL — ABNORMAL LOW (ref 12.0–15.0)
MCH: 23.1 pg — ABNORMAL LOW (ref 26.0–34.0)
MCHC: 29.6 g/dL — ABNORMAL LOW (ref 30.0–36.0)
MCV: 78.1 fL — ABNORMAL LOW (ref 80.0–100.0)
Platelets: 273 K/uL (ref 150–400)
RBC: 4.02 MIL/uL (ref 3.87–5.11)
RDW: 20 % — ABNORMAL HIGH (ref 11.5–15.5)
WBC: 4.3 K/uL (ref 4.0–10.5)
nRBC: 0 % (ref 0.0–0.2)

## 2024-04-24 LAB — ETHANOL: Alcohol, Ethyl (B): <15 mg/dL (ref <15)

## 2024-04-24 LAB — HCG, SERUM, QUALITATIVE: Preg, Serum: NEGATIVE

## 2024-04-24 MED ORDER — QUETIAPINE FUMARATE 50 MG PO TABS
50.0000 mg | ORAL_TABLET | Freq: Once | ORAL | Status: AC
  Administered 2024-04-24: 50 mg via ORAL
  Filled 2024-04-24: qty 1

## 2024-04-24 NOTE — ED Notes (Signed)
 Safety rounding completed. Pt asleep.

## 2024-04-24 NOTE — ED Triage Notes (Signed)
 Pt called 911 from home and stated that she cut her L wrist and wanted to kill herself.  She also stated that she had not eaten in 3 days.  No laceration was found on pt wrist, only abrasions to forearm and elbow.  Pt repeatedly requesting food.  Stating she wants to die and that she is hungry.  Pt states she has not been taking medications because she does not want to live anymore.  Pt states she came to the hospital because she doesn't have a way to get any food and she's hungry.   O2 98.7 HR 56 Bp 192/112 Cbg 229

## 2024-04-24 NOTE — ED Notes (Signed)
 Assumed care for the patient, Pt is asleep.
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
## 2024-04-24 NOTE — ED Provider Notes (Signed)
 [place] EMERGENCY DEPARTMENT AT [facility] Provider Note  MDM   HPI/ROS:  Kaitlyn Gray is a 48 y.o. female with a PMH of schizophrenia, BPD, polysubstance use, T2DM, HTN, MDD, anemia who presents to the emergency department for suicidal ideation.  Patient states that she does not want to live anymore, that she has a plan to cut her wrists and her throat.  She states that she has not eaten in many days, last use crack cocaine 3 days prior.  She states that she has been off of her medications for approximately 1 month, has been unable to afford them.  Physical exam is notable for: -  On my initial evaluation, patient is:  -Vital signs stable.*** Patient afebrile***, hemodynamically stable***, and non-toxic appearing.*** -Additional history obtained from ***  This patient's current presentation, including their history and physical exam, is most consistent with ***. Differentials include ***.     Interpretations, interventions, and the patient's course of care are documented below.      ***   Disposition:  {ED Dispo:29898}  Clinical Impression: No diagnosis found.  Rx / DC Orders ED Discharge Orders     None       The plan for this patient was discussed with Dr. ***, who voiced agreement and who oversaw evaluation and treatment of this patient.   Clinical Complexity A medically appropriate history, review of systems, and physical exam was performed.  My independent interpretations of EKG, labs, and radiology are documented in the ED course above.   If decision rules were used in this patient's evaluation, they are listed below.  *** Click here for ABCD2, HEART and other calculatorsREFRESH Note before signing   Patient's presentation is most consistent with {EM COPA:27473}  Medical Decision Making Amount and/or Complexity of Data Reviewed Labs: ordered.    HPI/ROS      See MDM section for pertinent HPI and ROS. A complete ROS was performed with  pertinent positives/negatives noted above.   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    Encounter for assessment of healthcare decision-making capacity    History of cervical fracture 12/24/2017   nondisplaced fracture lateral mass of C1 on the right on CT 12/24/17   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) 07/19/2017   Hypertension    Hypertensive emergency 05/31/2022   Ischemic stroke (HCC) 01/01/2020   subacute right middle cerebellar peduncle and pons infarction   Left-sided weakness 01/27/2022   Head CT with remote right occipital infarct which is consistent with old left-sided weakness   MDD (major depressive disorder), recurrent severe, 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   Normocytic anemia 02/09/2020   Opiate use    History of Suboxone  Therapy until 05/2021   Polysubstance abuse (HCC) 07/19/2017   Prescription drug misadventures (Seroquel )     Past Surgical History:  Procedure Laterality Date   CESAREAN SECTION        Physical Exam   Vitals:   04/24/24 1237 04/24/24 1241  BP: (!) 150/84   Pulse: 65   Resp: 20   Temp: 98.4 F (36.9 C)   TempSrc: Oral   SpO2: 100%   Weight:  66 kg  Height:  5' 4 (1.626 m)  Physical Exam   Procedures   If procedures were preformed on this patient, they are listed below:  Procedures   @BBSIG @   Please note that this documentation was produced with the assistance of voice-to-text technology and may contain errors.

## 2024-04-25 ENCOUNTER — Inpatient Hospital Stay (HOSPITAL_COMMUNITY)
Admission: AD | Admit: 2024-04-25 | Discharge: 2024-04-30 | DRG: 885 | Disposition: A | Discharge status: Home / Self Care | Payer: MEDICAID | Source: Intra-hospital

## 2024-04-25 DIAGNOSIS — F603 Borderline personality disorder: Secondary | ICD-10-CM | POA: Diagnosis present

## 2024-04-25 DIAGNOSIS — E119 Type 2 diabetes mellitus without complications: Secondary | ICD-10-CM | POA: Diagnosis present

## 2024-04-25 DIAGNOSIS — Z79899 Other long term (current) drug therapy: Secondary | ICD-10-CM

## 2024-04-25 DIAGNOSIS — Z91148 Patient's other noncompliance with medication regimen for other reason: Secondary | ICD-10-CM

## 2024-04-25 DIAGNOSIS — Z5941 Food insecurity: Secondary | ICD-10-CM | POA: Diagnosis not present

## 2024-04-25 DIAGNOSIS — I10 Essential (primary) hypertension: Secondary | ICD-10-CM | POA: Diagnosis present

## 2024-04-25 DIAGNOSIS — G40909 Epilepsy, unspecified, not intractable, without status epilepticus: Secondary | ICD-10-CM | POA: Diagnosis present

## 2024-04-25 DIAGNOSIS — Z9151 Personal history of suicidal behavior: Secondary | ICD-10-CM | POA: Diagnosis not present

## 2024-04-25 DIAGNOSIS — F1420 Cocaine dependence, uncomplicated: Secondary | ICD-10-CM | POA: Diagnosis present

## 2024-04-25 DIAGNOSIS — Z6824 Body mass index (BMI) 24.0-24.9, adult: Secondary | ICD-10-CM

## 2024-04-25 DIAGNOSIS — Z8673 Personal history of transient ischemic attack (TIA), and cerebral infarction without residual deficits: Secondary | ICD-10-CM

## 2024-04-25 DIAGNOSIS — F17290 Nicotine dependence, other tobacco product, uncomplicated: Secondary | ICD-10-CM | POA: Diagnosis present

## 2024-04-25 DIAGNOSIS — R630 Anorexia: Secondary | ICD-10-CM | POA: Diagnosis present

## 2024-04-25 DIAGNOSIS — Z7984 Long term (current) use of oral hypoglycemic drugs: Secondary | ICD-10-CM | POA: Diagnosis not present

## 2024-04-25 DIAGNOSIS — F1220 Cannabis dependence, uncomplicated: Secondary | ICD-10-CM | POA: Diagnosis present

## 2024-04-25 DIAGNOSIS — R45850 Homicidal ideations: Secondary | ICD-10-CM | POA: Diagnosis present

## 2024-04-25 DIAGNOSIS — Z59869 Financial insecurity, unspecified: Secondary | ICD-10-CM

## 2024-04-25 DIAGNOSIS — F259 Schizoaffective disorder, unspecified: Secondary | ICD-10-CM

## 2024-04-25 DIAGNOSIS — R45851 Suicidal ideations: Secondary | ICD-10-CM | POA: Diagnosis present

## 2024-04-25 DIAGNOSIS — F1910 Other psychoactive substance abuse, uncomplicated: Secondary | ICD-10-CM | POA: Diagnosis present

## 2024-04-25 DIAGNOSIS — Z91410 Personal history of adult physical and sexual abuse: Secondary | ICD-10-CM

## 2024-04-25 DIAGNOSIS — F17210 Nicotine dependence, cigarettes, uncomplicated: Secondary | ICD-10-CM | POA: Diagnosis present

## 2024-04-25 DIAGNOSIS — F1994 Other psychoactive substance use, unspecified with psychoactive substance-induced mood disorder: Secondary | ICD-10-CM

## 2024-04-25 DIAGNOSIS — F319 Bipolar disorder, unspecified: Secondary | ICD-10-CM | POA: Diagnosis present

## 2024-04-25 DIAGNOSIS — Z5948 Other specified lack of adequate food: Secondary | ICD-10-CM

## 2024-04-25 DIAGNOSIS — Z8249 Family history of ischemic heart disease and other diseases of the circulatory system: Secondary | ICD-10-CM | POA: Diagnosis not present

## 2024-04-25 DIAGNOSIS — I69322 Dysarthria following cerebral infarction: Secondary | ICD-10-CM | POA: Diagnosis not present

## 2024-04-25 DIAGNOSIS — Z91411 Personal history of adult psychological abuse: Secondary | ICD-10-CM | POA: Diagnosis not present

## 2024-04-25 DIAGNOSIS — Z91018 Allergy to other foods: Secondary | ICD-10-CM

## 2024-04-25 DIAGNOSIS — F4324 Adjustment disorder with disturbance of conduct: Secondary | ICD-10-CM | POA: Diagnosis present

## 2024-04-25 DIAGNOSIS — F419 Anxiety disorder, unspecified: Secondary | ICD-10-CM | POA: Diagnosis present

## 2024-04-25 DIAGNOSIS — F250 Schizoaffective disorder, bipolar type: Secondary | ICD-10-CM | POA: Diagnosis not present

## 2024-04-25 DIAGNOSIS — Z91040 Latex allergy status: Secondary | ICD-10-CM

## 2024-04-25 DIAGNOSIS — Z62810 Personal history of physical and sexual abuse in childhood: Secondary | ICD-10-CM

## 2024-04-25 DIAGNOSIS — G4700 Insomnia, unspecified: Secondary | ICD-10-CM | POA: Diagnosis present

## 2024-04-25 DIAGNOSIS — Z885 Allergy status to narcotic agent status: Secondary | ICD-10-CM

## 2024-04-25 DIAGNOSIS — Z560 Unemployment, unspecified: Secondary | ICD-10-CM

## 2024-04-25 DIAGNOSIS — Z5982 Transportation insecurity: Secondary | ICD-10-CM

## 2024-04-25 LAB — RAPID URINE DRUG SCREEN, HOSP PERFORMED
Amphetamines: NOT DETECTED
Barbiturates: NOT DETECTED
Benzodiazepines: NOT DETECTED
Cocaine: POSITIVE — AB
Opiates: NOT DETECTED
Tetrahydrocannabinol: POSITIVE — AB

## 2024-04-25 LAB — GLUCOSE, CAPILLARY
Glucose-Capillary: 111 mg/dL — ABNORMAL HIGH (ref 70–99)
Glucose-Capillary: 127 mg/dL — ABNORMAL HIGH (ref 70–99)

## 2024-04-25 MED ORDER — HALOPERIDOL LACTATE 5 MG/ML IJ SOLN
10.0000 mg | Freq: Three times a day (TID) | INTRAMUSCULAR | Status: DC | PRN
  Administered 2024-04-25: 10 mg via INTRAMUSCULAR
  Filled 2024-04-25: qty 2

## 2024-04-25 MED ORDER — MAGNESIUM HYDROXIDE 400 MG/5ML PO SUSP
30.0000 mL | Freq: Every day | ORAL | Status: DC | PRN
  Administered 2024-04-28 – 2024-04-29 (×2): 30 mL via ORAL
  Filled 2024-04-25 (×2): qty 30

## 2024-04-25 MED ORDER — INSULIN ASPART 100 UNIT/ML IJ SOLN
0.0000 [IU] | Freq: Three times a day (TID) | INTRAMUSCULAR | Status: DC
  Administered 2024-04-26: 2 [IU] via SUBCUTANEOUS

## 2024-04-25 MED ORDER — TRAZODONE HCL 50 MG PO TABS: 50.0000 mg | ORAL_TABLET | Freq: Every evening | ORAL | Status: DC | PRN

## 2024-04-25 MED ORDER — ENSURE PLUS HIGH PROTEIN PO LIQD
237.0000 mL | Freq: Two times a day (BID) | ORAL | Status: DC
  Filled 2024-04-25 (×5): qty 237

## 2024-04-25 MED ORDER — LORAZEPAM 2 MG/ML IJ SOLN
2.0000 mg | Freq: Three times a day (TID) | INTRAMUSCULAR | Status: DC | PRN
  Administered 2024-04-25: 2 mg via INTRAMUSCULAR
  Filled 2024-04-25: qty 1

## 2024-04-25 MED ORDER — QUETIAPINE FUMARATE 50 MG PO TABS
50.0000 mg | ORAL_TABLET | Freq: Every day | ORAL | Status: DC
  Administered 2024-04-26: 50 mg via ORAL
  Filled 2024-04-25: qty 1

## 2024-04-25 MED ORDER — HYDROXYZINE HCL 25 MG PO TABS: 25.0000 mg | ORAL_TABLET | Freq: Three times a day (TID) | ORAL | Status: DC | PRN

## 2024-04-25 MED ORDER — ARIPIPRAZOLE ER 400 MG IM SRER
400.0000 mg | INTRAMUSCULAR | Status: DC
  Administered 2024-04-25: 400 mg via INTRAMUSCULAR
  Filled 2024-04-25: qty 2

## 2024-04-25 MED ORDER — INSULIN ASPART 100 UNIT/ML IJ SOLN: 0.0000 [IU] | Freq: Every day | INTRAMUSCULAR | Status: DC

## 2024-04-25 MED ORDER — ACETAMINOPHEN 325 MG PO TABS
650.0000 mg | ORAL_TABLET | Freq: Four times a day (QID) | ORAL | Status: DC | PRN
  Administered 2024-04-27 – 2024-04-28 (×2): 650 mg via ORAL
  Filled 2024-04-25 (×2): qty 2

## 2024-04-25 MED ORDER — HALOPERIDOL LACTATE 5 MG/ML IJ SOLN: 5.0000 mg | Freq: Three times a day (TID) | INTRAMUSCULAR | Status: DC | PRN

## 2024-04-25 MED ORDER — LORAZEPAM 2 MG/ML IJ SOLN: 2.0000 mg | Freq: Three times a day (TID) | INTRAMUSCULAR | Status: DC | PRN

## 2024-04-25 MED ORDER — HALOPERIDOL 5 MG PO TABS
5.0000 mg | ORAL_TABLET | Freq: Three times a day (TID) | ORAL | Status: DC | PRN
  Administered 2024-04-26 – 2024-04-27 (×2): 5 mg via ORAL
  Filled 2024-04-25 (×2): qty 1

## 2024-04-25 MED ORDER — ALUM & MAG HYDROXIDE-SIMETH 200-200-20 MG/5ML PO SUSP
30.0000 mL | ORAL | Status: DC | PRN
  Administered 2024-04-27 – 2024-04-29 (×2): 30 mL via ORAL
  Filled 2024-04-25 (×2): qty 30

## 2024-04-25 MED ORDER — DIPHENHYDRAMINE HCL 25 MG PO CAPS
50.0000 mg | ORAL_CAPSULE | Freq: Three times a day (TID) | ORAL | Status: DC | PRN
  Administered 2024-04-26 – 2024-04-27 (×2): 50 mg via ORAL
  Filled 2024-04-25 (×2): qty 2

## 2024-04-25 MED ORDER — DIPHENHYDRAMINE HCL 50 MG/ML IJ SOLN
50.0000 mg | Freq: Three times a day (TID) | INTRAMUSCULAR | Status: DC | PRN
  Administered 2024-04-25: 50 mg via INTRAMUSCULAR
  Filled 2024-04-25: qty 1

## 2024-04-25 MED ORDER — QUETIAPINE FUMARATE 50 MG PO TABS
50.0000 mg | ORAL_TABLET | Freq: Every day | ORAL | Status: DC
Start: 2024-04-25 — End: 2024-04-25

## 2024-04-25 MED ORDER — DIPHENHYDRAMINE HCL 50 MG/ML IJ SOLN: 50.0000 mg | Freq: Three times a day (TID) | INTRAMUSCULAR | Status: DC | PRN

## 2024-04-25 NOTE — ED Notes (Signed)
 Safety rounding completed. Pt asleep.

## 2024-04-25 NOTE — Progress Notes (Signed)
 Pt is currently sleeping but when she wakes up the plan is to move her to the 500 hall.
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
## 2024-04-25 NOTE — Group Note (Signed)
 Date:  04/25/2024 Time:  3:54 PM  Group Topic/Focus: Chaplin Spirituality:   The focus of this group is to discuss how one's spirituality can aide in recovery.    Participation Level:  Did Not Attend   Kaitlyn Gray 04/25/2024, 3:54 PM

## 2024-04-25 NOTE — ED Notes (Signed)
 Safety rounding completed. Pt up to use the bathroom a few minutes ago. Requesting something to drink and a sandwich. Provided these things to the pt who is now resting in bed.

## 2024-04-25 NOTE — ED Notes (Signed)
VOL at this time

## 2024-04-25 NOTE — Group Note (Signed)
 Date:  04/25/2024 Time:  4:33 PM  Group Topic/Focus: Occupational therapy    Occupational therapy in mental health focuses on helping clients develop skills, habits, and routines that support emotional well-being, social participation, and daily functioning. Group therapy provides a supportive environment for peer learning, connection, and practice of coping skills.  Participation Level:  Did Not Attend   Kaitlyn Gray 04/25/2024, 4:33 PM

## 2024-04-25 NOTE — Plan of Care (Signed)
 Pt is not currently able to understand any teaching at this moment due to cognitive impairment.

## 2024-04-25 NOTE — Progress Notes (Signed)
Received report from Katelyn RN.

## 2024-04-25 NOTE — ED Provider Notes (Signed)
 Emergency Medicine Observation Re-evaluation Note  Kaitlyn Gray is a 48 y.o. female, seen on rounds today.  Pt initially presented to the ED for complaints of Suicidal Currently, the patient is resting.  Physical Exam  BP (!) 169/92 (BP Location: Right Arm)   Pulse 64   Temp 98 F (36.7 C) (Oral)   Resp 20   Ht 5' 4 (1.626 m)   Wt 66 kg   LMP  (LMP Unknown)   SpO2 100%   BMI 24.98 kg/m  Physical Exam General: NAD   ED Course / MDM  EKG:   I have reviewed the labs performed to date as well as medications administered while in observation.  Recent changes in the last 24 hours include no acute events reported.  Plan  Current plan is for placement - Tx to [facility].    Laurice Maude BROCKS, MD 04/25/24 [phone]

## 2024-04-25 NOTE — Group Note (Signed)
 Date:  04/25/2024 Time:  8:47 PM  Group Topic/Focus:  Wrap-Up Group:   The focus of this group is to help patients review their daily goal of treatment and discuss progress on daily workbooks.    Participation Level:  Did Not Attend  Participation Quality:  Did Not Attend  Affect:  Did Not Attend  Cognitive:  Did Not Attend  Insight: None  Engagement in Group:  Did Not Attend  Modes of Intervention:  Did Not Attend  Additional Comments:  Pt was encouraged to attend wrap up group but did not attend.  Lonni Na 04/25/2024, 8:47 PM
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
## 2024-04-25 NOTE — Progress Notes (Signed)
 Pt has been accepted to [facility] on 04/25/2024 Bed assignment: 404-01  Pt meets inpatient criteria per: Elveria Batter NP   Attending Physician will be: Dr. Prentis MD  Report can be called un:lwpu: Adult unit: [phone]  Pt can arrive after [facility] WILL UPDATE   Care Team Notified: [facility] Cherylynn Ernst RN, Elveria Batter NP  [place] Candler Ginsberg LCSW-A   04/25/2024 10:20 AM

## 2024-04-26 LAB — GLUCOSE, CAPILLARY
Glucose-Capillary: 144 mg/dL — ABNORMAL HIGH (ref 70–99)
Glucose-Capillary: 100 mg/dL — ABNORMAL HIGH (ref 70–99)
Glucose-Capillary: 132 mg/dL — ABNORMAL HIGH (ref 70–99)

## 2024-04-26 MED ORDER — NICOTINE 14 MG/24HR TD PT24
14.0000 mg | MEDICATED_PATCH | Freq: Every day | TRANSDERMAL | Status: DC
  Filled 2024-04-26 (×2): qty 1

## 2024-04-26 MED ORDER — NICOTINE POLACRILEX 2 MG MT GUM: 2.0000 mg | CHEWING_GUM | OROMUCOSAL | Status: DC | PRN

## 2024-04-26 MED ORDER — METFORMIN HCL 500 MG PO TABS
500.0000 mg | ORAL_TABLET | Freq: Two times a day (BID) | ORAL | Status: DC
  Filled 2024-04-26: qty 1

## 2024-04-26 MED ORDER — BENZTROPINE MESYLATE 1 MG PO TABS
1.0000 mg | ORAL_TABLET | Freq: Two times a day (BID) | ORAL | Status: DC
  Administered 2024-04-26 – 2024-04-29 (×3): 1 mg via ORAL
  Filled 2024-04-26 (×2): qty 1
  Filled 2024-04-26: qty 14
  Filled 2024-04-26 (×4): qty 1

## 2024-04-26 MED ORDER — LOSARTAN POTASSIUM 25 MG PO TABS
25.0000 mg | ORAL_TABLET | Freq: Every day | ORAL | Status: DC
  Administered 2024-04-26 – 2024-04-30 (×5): 25 mg via ORAL
  Filled 2024-04-26 (×2): qty 1
  Filled 2024-04-26: qty 7
  Filled 2024-04-26 (×3): qty 1

## 2024-04-26 NOTE — Progress Notes (Signed)
 Patient became agitated upon returning from cafeteria from dinner. RN attempted to recheck pt's BP due to it being elevated earlier, pt unable to sit still long enough to obtain reading. Pt began rocking back and forth screaming NO. PO Haldol  and Benadryl  provided to patient per [facility]. Pt took medication cooperatively. Pt provided with a change of scrubs and new brief due to complaints of her soiling herself.

## 2024-04-26 NOTE — Progress Notes (Signed)
 Patient refused CBG monitoring

## 2024-04-26 NOTE — [facility] Group Notes (Signed)
 Adult Psychoeducational Group Note  Date:  04/26/2024 Time:  1:33 PM  Group Topic/Focus: Pharmacies Group   Participation Level:  Did Not Attend  Participation Quality:    Affect:    Cognitive:    Insight:   Engagement in Group:    Modes of Intervention:    Additional Comments:    Annett Berle Hoyer 04/26/2024, 1:33 PM

## 2024-04-26 NOTE — [facility] Group Notes (Signed)
 Adult Psychoeducational Group Note  Date:  04/26/2024 Time:  9:06 AM  Group Topic/Focus: [place] Group Goals Group:   The focus of this group is to help patients establish daily goals to achieve during treatment and discuss how the patient can incorporate goal setting into their daily lives to aide in recovery.  Participation Level:  na  Participation Quality:  na  Affect:  na  Cognitive:  na  Insight: na  Engagement in Group:  na  Modes of Intervention:  na  Additional Comments:  Did not attend group.  Leif Loflin Lee 04/26/2024, 9:06 AM

## 2024-04-26 NOTE — Plan of Care (Signed)
   Problem: Education: Goal: Knowledge of [place] [facility] Education information/materials will improve Outcome: Not Progressing Goal: Emotional status will improve Outcome: Not Progressing Goal: Mental status will improve Outcome: Not Progressing Goal: Verbalization of understanding the information provided will improve Outcome: Not Progressing   Problem: Activity: Goal: Interest or engagement in activities will improve Outcome: Not Progressing   Problem: Coping: Goal: Ability to verbalize frustrations and anger appropriately will improve Outcome: Not Progressing Goal: Ability to demonstrate self-control will improve Outcome: Not Progressing   Problem: Health Behavior/Discharge Planning: Goal: Identification of resources available to assist in meeting health care needs will improve Outcome: Not Progressing Goal: Compliance with treatment plan for underlying cause of condition will improve Outcome: Not Progressing   Problem: Physical Regulation: Goal: Ability to maintain clinical measurements within normal limits will improve Outcome: Not Progressing   Problem: Safety: Goal: Periods of time without injury will increase Outcome: Not Progressing   Problem: Education: Goal: Ability to describe self-care measures that may prevent or decrease complications (Diabetes Survival Skills Education) will improve Outcome: Not Progressing Goal: Individualized Educational Video(s) Outcome: Not Progressing   Problem: Coping: Goal: Ability to adjust to condition or change in health will improve Outcome: Not Progressing   Problem: Fluid Volume: Goal: Ability to maintain a balanced intake and output will improve Outcome: Not Progressing   Problem: Health Behavior/Discharge Planning: Goal: Ability to identify and utilize available resources and services will improve Outcome: Not Progressing Goal: Ability to manage health-related needs will improve Outcome: Not Progressing    Problem: Metabolic: Goal: Ability to maintain appropriate glucose levels will improve Outcome: Not Progressing

## 2024-04-26 NOTE — H&P (Cosign Needed)
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 discharge     5. Discharge Planning:  -- Social work and case management to assist with discharge planning and identification of hospital follow-up needs prior to discharge -- Estimated LOS: 5-7 days -- Discharge Concerns: Need to establish a safety plan; Medication compliance and effectiveness -- Discharge Goals: Return home with outpatient referrals for mental health follow-up including medication management/psychotherapy       Physician Treatment Plan for Primary Diagnosis: Schizoaffective disorder (HCC) Long Term Goal(s): Improvement in symptoms so as ready for discharge  Short Term Goals: Ability to identify changes in lifestyle to  reduce recurrence of condition will improve, Ability to verbalize feelings will improve, Ability to disclose and discuss suicidal ideas, Ability to demonstrate self-control will improve, Ability to identify and develop effective coping behaviors will improve, Ability to maintain clinical measurements within normal limits will improve, Compliance with prescribed medications will improve, and Ability to identify triggers associated with substance abuse/mental health issues will improve  Physician Treatment Plan for Secondary Diagnosis: Principal Problem:   Schizoaffective disorder (HCC) Active Problems:   Polysubstance abuse (HCC)   Hypertension   History of stroke  Long Term Goal(s): Improvement in symptoms so as ready for discharge  Short Term Goals: Ability to identify changes in lifestyle to reduce recurrence of condition will improve, Ability to verbalize feelings will improve, Ability to disclose and discuss suicidal ideas, Ability to demonstrate self-control will improve, Ability to identify and develop effective coping behaviors will improve, Ability to maintain clinical measurements within normal limits will improve, Compliance with prescribed medications will improve, and Ability to identify triggers associated with substance abuse/mental health issues will improve  I certify that inpatient services furnished can reasonably be expected to improve the patient's condition.    Blair Chiquita Hint, NP 11/12/20257:30 AM

## 2024-04-26 NOTE — Progress Notes (Addendum)
(  Sleep Hours) -11.5 (Any PRNs that were needed, meds refused, or side effects to meds)-none  (Any disturbances and when (visitation, over night)-pt gait is unsteady. Writer tried educating pt on ambulating safely. Pt was observed by writer on the toilet falling asleep. Writer tried to explained to pt that it would be safer to receive her breakfast on the unit due to her being drowsy still. Pt became argumentative and stated,  I have not been to eat since I been here. (Concerns raised by the patient)- pt is hyper focused on eating.  (SI/HI/AVH)-endorses AH

## 2024-04-26 NOTE — Group Note (Signed)
 Date:  04/26/2024 Time:  8:36 PM  Group Topic/Focus:  Wrap-Up Group:   The focus of this group is to help patients review their daily goal of treatment and discuss progress on daily workbooks.    Participation Level:  Did Not Attend

## 2024-04-26 NOTE — Plan of Care (Signed)
   Problem: Education: Goal: Knowledge of [place] [facility] Education information/materials will improve Outcome: Progressing Goal: Emotional status will improve Outcome: Progressing Goal: Mental status will improve Outcome: Progressing

## 2024-04-26 NOTE — Group Note (Signed)
 Recreation Therapy Group Note   Group Topic:Self-Esteem  Group Date: 04/26/2024 Start Time: 1040 End Time: 1052 Facilitators: Steel Kerney-McCall, LRT,CTRS Location: [address] Dayroom   Group Topic: Self-esteem  Goal Area(s) Addresses:  Patient will identify and write at positive traits and words about themself. Patient will acknowledge the benefit of healthy self-esteem. Patient will endorse understanding of ways to increase self-esteem.   Behavioral Response:    Intervention: Personalized Plate- printed license plate template, markers or colored pencils   Activity: LRT began group session with open dialogue asking the patients to define self-esteem and verbally identify positive qualities and traits people may possess. Patients were then instructed to design a personalized license plate, with words and drawings, representing at least 3 positive things about themselves. Pts were encouraged to include favorites, things they are proud of, what they enjoy doing, and dreams for their future. If a patient had a life motto or a meaningful phase that expressed their life values, pt's were asked to incorporate that into their design as well. Patients were given the opportunity to share their completed work with the group.  Education: Healthy self-esteem, Positive character traits, Accepting compliments, Leisure as competence and coping, Support Systems, Discharge planning LRT educated patients on the importance of healthy self-esteem and ways to build self-esteem. LRT addressed discharge planning reviewing positive coping skills and healthy support systems.  Education Outcome: Acknowledges education/In group clarification offered   Affect/Mood: N/A   Participation Level: Did not attend    Clinical Observations/Individualized Feedback:     Plan: Continue to engage patient in RT group sessions 2-3x/week.   Perlie Scheuring-McCall, LRT,CTRS  04/26/2024 11:34 AM

## 2024-04-26 NOTE — [facility] Group Notes (Signed)
 Adult Psychoeducational Group Note  Date:  04/26/2024 Time:  2:15 PM  Group Topic/Focus: Pharmacy Group Developing a Wellness Toolbox:   The focus of this group is to help patients develop a wellness toolbox with skills and strategies to promote recovery upon discharge.  Participation Level:  Did Not Attend  Participation Quality:  na  Affect:  na  Cognitive:  nna Insight: na  Engagement in Group:  na  Modes of Intervention:  na Additional Comments:  na  Kaitlyn Gray 04/26/2024, 2:15 PM

## 2024-04-26 NOTE — Progress Notes (Addendum)
 Pt has been resting in bed for a majority of the day. Patient allowed to go to meals, no behavioral concerns observed. Pt uses front wheel walker to ambulate. Pt's ambulation remains unsteady. Pt refused scheduled metformin  prior to dinner despite education.     04/26/24 0959  Psych Admission Type (Psych Patients Only)  Admission Status Voluntary  Psychosocial Assessment  Patient Complaints Other (Comment) (Pt has been asleep all morning)  Eye Contact None  Facial Expression Other (Comment);Flat  Affect Irritable  Speech Logical/coherent  Interaction Isolative  Motor Activity Unsteady  Appearance/Hygiene Disheveled  Behavior Characteristics Irritable  Mood Irritable  Thought Process  Coherency Circumstantial  Content Preoccupation  Delusions None reported or observed  Perception UTA (pt sleeping most of day)  Hallucination UTA (pt sleeping most of day)  Judgment Poor  Confusion None  Danger to Self  Current suicidal ideation? Denies  Description of Suicide Plan N/A  Self-Injurious Behavior No self-injurious ideation or behavior indicators observed or expressed   Agreement Not to Harm Self Yes  Description of Agreement Verbal  Danger to Others  Danger to Others None reported or observed

## 2024-04-26 NOTE — Progress Notes (Signed)
   04/26/24 1632  Vital Signs  Pulse Rate 75  BP (!) 177/97  ECG Intervals  PR interval 75   Pt's BP elevated this afternoon. NP notified. Scheduled Losartan  administered per MAR.
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
## 2024-04-26 NOTE — BHH Suicide Risk Assessment (Signed)
 [facility] INPATIENT:  Family/Significant Other Suicide Prevention Education  Suicide Prevention Education:  Patient Refusal for Family/Significant Other Suicide Prevention Education: The patient Kaitlyn Gray has refused to provide written consent for family/significant other to be provided Family/Significant Other Suicide Prevention Education during admission and/or prior to discharge.  Physician notified.  Ladonte Verstraete O Ivie Maese, LCSWA 04/26/2024, 6:24 PM
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
## 2024-04-26 NOTE — Plan of Care (Signed)
   Problem: Education: Goal: Knowledge of [place] [facility] Education information/materials will improve Outcome: Progressing Goal: Emotional status will improve Outcome: Progressing Goal: Mental status will improve Outcome: Progressing Goal: Verbalization of understanding the information provided will improve Outcome: Progressing

## 2024-04-26 NOTE — [facility] Group Notes (Signed)
 Adult Psychoeducational Group Note  Date:  04/26/2024 Time:  11:47 AM  Group Topic/Focus: Recreational Therapy Group   Participation Level:  Did Not Attend  Participation Quality:    Affect:    Cognitive:    Insight:   Engagement in Group:    Modes of Intervention:    Additional Comments:    Annett Berle Hoyer 04/26/2024, 11:47 AM

## 2024-04-27 ENCOUNTER — Encounter (HOSPITAL_COMMUNITY): Payer: Self-pay

## 2024-04-27 DIAGNOSIS — F250 Schizoaffective disorder, bipolar type: Secondary | ICD-10-CM

## 2024-04-27 LAB — VITAMIN B12: Vitamin B-12: 510 pg/mL (ref 180–914)

## 2024-04-27 LAB — LIPID PANEL
Cholesterol: 154 mg/dL (ref 0–200)
HDL: 59 mg/dL (ref >40)
LDL Cholesterol: 85 mg/dL (ref 0–99)
Total CHOL/HDL Ratio: 2.6 ratio
Triglycerides: 53 mg/dL (ref <150)
VLDL: 11 mg/dL (ref 0–40)

## 2024-04-27 LAB — GLUCOSE, CAPILLARY
Glucose-Capillary: 145 mg/dL — ABNORMAL HIGH (ref 70–99)
Glucose-Capillary: 119 mg/dL — ABNORMAL HIGH (ref 70–99)

## 2024-04-27 LAB — TSH: TSH: 1.21 u[IU]/mL (ref 0.350–4.500)

## 2024-04-27 LAB — HEMOGLOBIN A1C
Hgb A1c MFr Bld: 5.9 % — ABNORMAL HIGH (ref 4.8–5.6)
Mean Plasma Glucose: 122.63 mg/dL

## 2024-04-27 LAB — VITAMIN D 25 HYDROXY (VIT D DEFICIENCY, FRACTURES): Vit D, 25-Hydroxy: 19.99 ng/mL — ABNORMAL LOW (ref 30–100)

## 2024-04-27 MED ORDER — METFORMIN HCL 500 MG PO TABS
500.0000 mg | ORAL_TABLET | Freq: Two times a day (BID) | ORAL | Status: DC
  Filled 2024-04-27 (×2): qty 1
  Filled 2024-04-27: qty 14

## 2024-04-27 MED ORDER — VITAMIN D (ERGOCALCIFEROL) 1.25 MG (50000 UNIT) PO CAPS
50000.0000 [IU] | ORAL_CAPSULE | ORAL | Status: DC
  Filled 2024-04-27: qty 1

## 2024-04-27 MED ORDER — CLONIDINE HCL 0.1 MG PO TABS
0.1000 mg | ORAL_TABLET | Freq: Once | ORAL | Status: DC
  Filled 2024-04-27: qty 1

## 2024-04-27 MED ORDER — QUETIAPINE FUMARATE 200 MG PO TABS
200.0000 mg | ORAL_TABLET | Freq: Every day | ORAL | Status: DC
  Administered 2024-04-27 – 2024-04-29 (×3): 200 mg via ORAL
  Filled 2024-04-27: qty 1
  Filled 2024-04-27: qty 7
  Filled 2024-04-27 (×2): qty 1

## 2024-04-27 MED ORDER — HYDROXYZINE HCL 50 MG PO TABS
50.0000 mg | ORAL_TABLET | Freq: Three times a day (TID) | ORAL | Status: DC | PRN
  Administered 2024-04-27: 50 mg via ORAL
  Filled 2024-04-27: qty 1
  Filled 2024-04-27: qty 10
  Filled 2024-04-27: qty 1

## 2024-04-27 MED ORDER — QUETIAPINE FUMARATE 50 MG PO TABS
50.0000 mg | ORAL_TABLET | Freq: Two times a day (BID) | ORAL | Status: DC
  Administered 2024-04-27 – 2024-04-30 (×6): 50 mg via ORAL
  Filled 2024-04-27 (×2): qty 1
  Filled 2024-04-27 (×2): qty 14
  Filled 2024-04-27 (×4): qty 1
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
## 2024-04-27 NOTE — BH IP Treatment Plan (Signed)
 Interdisciplinary Treatment and Diagnostic Plan Update  04/27/2024 Time of Session: 1100AM Kaitlyn Gray MRN: [id number]  Principal Diagnosis: Schizoaffective disorder [facility])  Secondary Diagnoses: Principal Problem:   Schizoaffective disorder (HCC) Active Problems:   Polysubstance abuse (HCC)   Hypertension   History of stroke   Current Medications:  Current Facility-Administered Medications  Medication Dose Route Frequency Provider Last Rate Last Admin   acetaminophen  (TYLENOL ) tablet 650 mg  650 mg Oral Q6H PRN Coleman, Carolyn H, NP       alum & mag hydroxide-simeth (MAALOX/MYLANTA) 200-200-20 MG/5ML suspension 30 mL  30 mL Oral Q4H PRN Mardy Elveria DEL, NP       ARIPiprazole  ER (ABILIFY  MAINTENA) injection 400 mg  400 mg Intramuscular Q28 days Prentis Oliva LABOR, DO   400 mg at 04/25/24 1841   benztropine  (COGENTIN ) tablet 1 mg  1 mg Oral BID Bennett, Christal H, NP   1 mg at 04/27/24 9060   haloperidol  (HALDOL ) tablet 5 mg  5 mg Oral TID PRN Mardy Elveria DEL, NP   5 mg at 04/26/24 1815   And   diphenhydrAMINE  (BENADRYL ) capsule 50 mg  50 mg Oral TID PRN Coleman, Carolyn H, NP   50 mg at 04/26/24 1815   haloperidol  lactate (HALDOL ) injection 5 mg  5 mg Intramuscular TID PRN Mardy Elveria DEL, NP       And   diphenhydrAMINE  (BENADRYL ) injection 50 mg  50 mg Intramuscular TID PRN Mardy Elveria DEL, NP       And   LORazepam  (ATIVAN ) injection 2 mg  2 mg Intramuscular TID PRN Mardy Elveria DEL, NP       haloperidol  lactate (HALDOL ) injection 10 mg  10 mg Intramuscular TID PRN Mardy Elveria DEL, NP   10 mg at 04/25/24 1520   And   diphenhydrAMINE  (BENADRYL ) injection 50 mg  50 mg Intramuscular TID PRN Coleman, Carolyn H, NP   50 mg at 04/25/24 1521   And   LORazepam  (ATIVAN ) injection 2 mg  2 mg Intramuscular TID PRN Coleman, Carolyn H, NP   2 mg at 04/25/24 1520   hydrOXYzine  (ATARAX ) tablet 25 mg  25 mg Oral TID PRN Coleman, Carolyn H, NP       losartan  (COZAAR ) tablet  25 mg  25 mg Oral Daily Bennett, Christal H, NP   25 mg at 04/27/24 9060   magnesium  hydroxide (MILK OF MAGNESIA) suspension 30 mL  30 mL Oral Daily PRN Mardy Elveria DEL, NP       nicotine  (NICODERM CQ  - dosed in mg/24 hours) patch 14 mg  14 mg Transdermal Daily Bennett, Christal H, NP       nicotine  polacrilex (NICORETTE ) gum 2 mg  2 mg Oral PRN Bennett, Christal H, NP       QUEtiapine  (SEROQUEL ) tablet 50 mg  50 mg Oral QHS Coleman, Carolyn H, NP   50 mg at 04/26/24 2102   traZODone  (DESYREL ) tablet 50 mg  50 mg Oral QHS PRN Coleman, Carolyn H, NP       PTA Medications: Medications Prior to Admission  Medication Sig Dispense Refill Last Dose/Taking   ARIPiprazole  ER (ABILIFY  MAINTENA) 400 MG SRER injection Inject 2 mLs (400 mg total) into the muscle every 28 (twenty-eight) days. (Due on 03-24-24): For mood control 1 each 0    benztropine  (COGENTIN ) 1 MG tablet Take 1 tablet (1 mg total) by mouth 2 (two) times daily. For prevention of drug induced tremors. 60 tablet 0  hydrOXYzine  (ATARAX ) 50 MG tablet Take 1 tablet (50 mg total) by mouth 3 (three) times daily as needed for anxiety. 90 tablet 0    losartan  (COZAAR ) 25 MG tablet Take 1 tablet (25 mg total) by mouth daily. For hypertension. 30 tablet 0    metFORMIN  (GLUCOPHAGE ) 500 MG tablet Take 1 tablet (500 mg total) by mouth 2 (two) times daily with a meal. For diabetes management. (Patient not taking: Reported on 04/25/2024) 60 tablet 0    QUEtiapine  (SEROQUEL ) 200 MG tablet Take 1 tablet (200 mg total) by mouth at bedtime. For mood control 30 tablet 0    QUEtiapine  (SEROQUEL ) 50 MG tablet Take 1 tablet (50 mg total) by mouth 2 (two) times daily. For agitation. 60 tablet 0     Patient Stressors:    Patient Strengths:    Treatment Modalities: Medication Management, Group therapy, Case management,  1 to 1 session with clinician, Psychoeducation, Recreational therapy.   Physician Treatment Plan for Primary Diagnosis: Schizoaffective  disorder (HCC) Long Term Goal(s): Improvement in symptoms so as ready for discharge   Short Term Goals: Ability to identify changes in lifestyle to reduce recurrence of condition will improve Ability to verbalize feelings will improve Ability to disclose and discuss suicidal ideas Ability to demonstrate self-control will improve Ability to identify and develop effective coping behaviors will improve Ability to maintain clinical measurements within normal limits will improve Compliance with prescribed medications will improve Ability to identify triggers associated with substance abuse/mental health issues will improve  Medication Management: Evaluate patient's response, side effects, and tolerance of medication regimen.  Therapeutic Interventions: 1 to 1 sessions, Unit Group sessions and Medication administration.  Evaluation of Outcomes: Not Progressing  Physician Treatment Plan for Secondary Diagnosis: Principal Problem:   Schizoaffective disorder (HCC) Active Problems:   Polysubstance abuse (HCC)   Hypertension   History of stroke  Long Term Goal(s): Improvement in symptoms so as ready for discharge   Short Term Goals: Ability to identify changes in lifestyle to reduce recurrence of condition will improve Ability to verbalize feelings will improve Ability to disclose and discuss suicidal ideas Ability to demonstrate self-control will improve Ability to identify and develop effective coping behaviors will improve Ability to maintain clinical measurements within normal limits will improve Compliance with prescribed medications will improve Ability to identify triggers associated with substance abuse/mental health issues will improve     Medication Management: Evaluate patient's response, side effects, and tolerance of medication regimen.  Therapeutic Interventions: 1 to 1 sessions, Unit Group sessions and Medication administration.  Evaluation of Outcomes: Not  Progressing   RN Treatment Plan for Primary Diagnosis: Schizoaffective disorder (HCC) Long Term Goal(s): Knowledge of disease and therapeutic regimen to maintain health will improve  Short Term Goals: Ability to remain free from injury will improve, Ability to verbalize frustration and anger appropriately will improve, Ability to demonstrate self-control, Ability to participate in decision making will improve, Ability to verbalize feelings will improve, Ability to disclose and discuss suicidal ideas, Ability to identify and develop effective coping behaviors will improve, and Compliance with prescribed medications will improve  Medication Management: RN will administer medications as ordered by provider, will assess and evaluate patient's response and provide education to patient for prescribed medication. RN will report any adverse and/or side effects to prescribing provider.  Therapeutic Interventions: 1 on 1 counseling sessions, Psychoeducation, Medication administration, Evaluate responses to treatment, Monitor vital signs and CBGs as ordered, Perform/monitor CIWA, COWS, AIMS and Fall Risk screenings as ordered,  Perform wound care treatments as ordered.  Evaluation of Outcomes: Not Progressing   LCSW Treatment Plan for Primary Diagnosis: Schizoaffective disorder (HCC) Long Term Goal(s): Safe transition to appropriate next level of care at discharge, Engage patient in therapeutic group addressing interpersonal concerns.  Short Term Goals: Engage patient in aftercare planning with referrals and resources, Increase social support, Increase ability to appropriately verbalize feelings, Increase emotional regulation, Facilitate acceptance of mental health diagnosis and concerns, Facilitate patient progression through stages of change regarding substance use diagnoses and concerns, Identify triggers associated with mental health/substance abuse issues, and Increase skills for wellness and  recovery  Therapeutic Interventions: Assess for all discharge needs, 1 to 1 time with Social worker, Explore available resources and support systems, Assess for adequacy in community support network, Educate family and significant other(s) on suicide prevention, Complete Psychosocial Assessment, Interpersonal group therapy.  Evaluation of Outcomes: Not Progressing   Progress in Treatment: Attending groups: No. Participating in groups: No. Taking medication as prescribed: Yes. Toleration medication: Yes. Family/Significant other contact made: No, will contact:  pt declined consents Patient understands diagnosis: No. Discussing patient identified problems/goals with staff: Yes. Medical problems stabilized or resolved: Yes. Denies suicidal/homicidal ideation: Yes. Issues/concerns per patient self-inventory: No.  New problem(s) identified: Yes, Describe:  pt states she is worried about her wheelchair being outside her house and possibly stolen  New Short Term/Long Term Goal(s): detox, medication management for mood stabilization; elimination of SI thoughts; development of comprehensive mental wellness/sobriety plan  Patient Goals:  Go home and stay on my meds  Discharge Plan or Barriers: Patient recently admitted. CSW will continue to follow and assess for appropriate referrals and possible discharge planning.    Reason for Continuation of Hospitalization: Hallucinations Medication stabilization Withdrawal symptoms  Estimated Length of Stay: 3-4 days  Last 3 [place] Suicide Severity Risk Score: Flowsheet Row Ad[facility] (Current) [facility] from [facility]/24/2024 in [facility] at [facility] from 03/24/2024 in [facility] Department at [facility]  C-SSRS RISK CATEGORY High Risk High Risk No Risk    Last [facility] 2/9 Scores:    07/17/2022    1:34 PM 12/12/2021    5:37 PM 07/15/2017     7:12 PM  Depression screen PHQ 2/9  Decreased Interest  1 2  Down, Depressed, Hopeless 2 1 2   PHQ - 2 Score 2 2 4   Altered sleeping 3 2 1   Tired, decreased energy 3 2 1   Change in appetite 3 1 1   Feeling bad or failure about yourself  3 2 1   Trouble concentrating 3 1 1   Moving slowly or fidgety/restless 3 1 0  Suicidal thoughts 1 1 1   PHQ-9 Score 21  12  10    Difficult doing work/chores Extremely dIfficult Somewhat difficult Somewhat difficult     Data saved with a previous flowsheet row definition    Scribe for Treatment Team: Jenkins LULLA Primer, [place]RA[place] 04/27/2024 11:38 AM

## 2024-04-27 NOTE — Progress Notes (Signed)
(  Sleep Hours) -9.75 as of 0515 (Any PRNs that were needed, meds refused, or side effects to meds)- none for this shift, pt did have po agitation protocol prior to this shift @1815  (Any disturbances and when (visitation, over night)-none (Concerns raised by the patient)- none (SI/HI/AVH)- denies all

## 2024-04-27 NOTE — Plan of Care (Signed)
?  Problem: Education: ?Goal: Knowledge of [facility] Education information/materials will improve ?Outcome: Progressing ?Goal: Emotional status will improve ?Outcome: Progressing ?Goal: Mental status will improve ?Outcome: Progressing ?Goal: Verbalization of understanding the information provided will improve ?Outcome: Progressing ?  ?Problem: Activity: ?Goal: Interest or engagement in activities will improve ?Outcome: Progressing ?  ?Problem: Safety: ?Goal: Periods of time without injury will increase ?Outcome: Progressing ?  ?

## 2024-04-27 NOTE — Progress Notes (Signed)
   04/27/24 0900  Psych Admission Type (Psych Patients Only)  Admission Status Voluntary  Psychosocial Assessment  Patient Complaints Other (Comment) (pt reports she wants to go home)  Eye Contact Brief  Facial Expression Flat;Sad;Angry  Affect Flat;Labile;Irritable;Depressed  Speech Logical/coherent;Slurred;Incoherent  Interaction Isolative  Motor Activity Unsteady  Appearance/Hygiene Disheveled  Behavior Characteristics Cooperative  Mood Depressed  Thought Process  Coherency Circumstantial;Incoherent  Content Preoccupation  Delusions None reported or observed  Perception Hallucinations  Hallucination Auditory  Judgment Poor  Confusion None  Danger to Self  Current suicidal ideation? Denies  Self-Injurious Behavior Actively performing acts reported or observed  Agreement Not to Harm Self Yes  Description of Agreement verbal  Danger to Others  Danger to Others None reported or observed
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
## 2024-04-27 NOTE — Group Note (Signed)
 Recreation Therapy Group Note   Group Topic:Anger Management  Group Date: 04/27/2024 Start Time: 1025 End Time: 1052 Facilitators: Rockelle Heuerman-McCall, LRT,CTRS Location: [address] Dayroom   Group Topic: Anger Management  Goal Area(s) Addresses:  Patient will identify triggers for anger.  Patient will identify positive coping skills to deal with anger.    Behavioral Response:   Intervention: Conversation with group, and worksheets  Activity: Anger Thermometer. LRT and patients talked about anger and the causes of it. Patients were given a worksheet with an anger thermometer rating anger on a scale of 1-5. Each number represented a level of anger (slightly irritated, irritated, angry, really angry and enraged). Patients were to identify what gets them angry at each level. Patients were to then identify coping skills they use to help deal with their anger.    Education: Anger Management, Discharge Planning   Education Outcome: Acknowledges education/In group clarification offered/Needs additional education.    Affect/Mood: N/A   Participation Level: Did not attend    Clinical Observations/Individualized Feedback:      Plan: Continue to engage patient in RT group sessions 2-3x/week.   Jerome Viglione-McCall, LRT,CTRS 04/27/2024 11:26 AM

## 2024-04-27 NOTE — Plan of Care (Signed)
   Problem: Education: Goal: Knowledge of [place] [facility] Education information/materials will improve Outcome: Progressing Goal: Emotional status will improve Outcome: Progressing Goal: Mental status will improve Outcome: Progressing

## 2024-04-27 NOTE — Progress Notes (Signed)
 Pt observed in room screaming unintelligible speech. Pt endorses auditory hallucinations. Pt received mild agitation protocol.

## 2024-04-27 NOTE — [facility] Group Notes (Signed)
 Adult Psychoeducational Group Note  Date:  04/27/2024 Time:  9:22 AM  Group Topic/Focus: Goals Group Goals Group:   The focus of this group is to help patients establish daily goals to achieve during treatment and discuss how the patient can incorporate goal setting into their daily lives to aide in recovery.  Participation Level:  Did Not Attend  Participation Quality:    Affect:    Cognitive:    Insight:   Engagement in Group:    Modes of Intervention:    Additional Comments:    Annett Berle Hoyer 04/27/2024, 9:22 AM

## 2024-04-27 NOTE — [facility] Group Notes (Signed)
 Adult Psychoeducational Group Note  Date:  04/27/2024 Time:  11:04 AM  Group Topic/Focus: Recreation Therapy Dimensions of Wellness:   The focus of this group is to introduce the topic of wellness and discuss the role each dimension of wellness plays in total health.  Participation Level:  Did Not Attend  Participation Quality  Affect:    Cognitive:    Insight:   Engagement in Group:    Modes of Intervention:    Additional Comments:    Ronae Noell Lee 04/27/2024, 11:04 AM

## 2024-04-27 NOTE — [facility] Group Notes (Signed)
 Adult Psychoeducational Group Note  Date:  04/27/2024 Time:  9:13 AM  Group Topic/Focus: Orentation Group Orientation:   The focus of this group is to educate the patient on the purpose and policies of crisis stabilization and provide a format to answer questions about their admission.  The group details unit policies and expectations of patients while admitted.  Participation Level:  Did Not Attend  Participation Quality:  na  Affect:  n  Cognitive:  NA  Insight: NA  Engagement in Group:  NA  Modes of Intervention:  NA  Additional Comments:  NA  Naida Rome Ruth 04/27/2024, 9:13 AM

## 2024-04-27 NOTE — [facility] Group Notes (Signed)
 Adult Psychoeducational Group Note  Date:  04/27/2024 Time:  11:05 AM  Group Topic/Focus: Recreational Therapy Group   Participation Level:  Did Not Attend  Participation Quality:    Affect:    Cognitive:    Insight:   Engagement in Group:    Modes of Intervention:    Additional Comments:    Annett Berle Hoyer 04/27/2024, 11:05 AM

## 2024-04-27 NOTE — Progress Notes (Signed)
 Conversation with patient:  Patient stated that she is not interested in inpatient substance use program, explaining that she needs to return to her home.  Patient said that she would be interested in substance use intensive (3 x per week) outpatient program.    Yaniel Limbaugh, LCSWA 04/27/2024

## 2024-04-27 NOTE — Group Note (Signed)
 Date:  04/27/2024 Time:  8:24 PM  Group Topic/Focus:  Wrap-Up Group:   The focus of this group is to help patients review their daily goal of treatment and discuss progress on daily workbooks.    Participation Level:  Active  Participation Quality:  Appropriate  Affect:  Appropriate  Cognitive:  Appropriate  Insight: Appropriate  Engagement in Group:  Engaged  Modes of Intervention:  Education  Additional Comments:  Patient attended and participated in group tonight. She reports that this has been the first day on the unit and today she did not attend any of her groups today.  Gwenn Chillington Dacosta 04/27/2024, 8:24 PM

## 2024-04-28 LAB — GLUCOSE, CAPILLARY
Glucose-Capillary: 98 mg/dL (ref 70–99)
Glucose-Capillary: 111 mg/dL — ABNORMAL HIGH (ref 70–99)

## 2024-04-28 MED ORDER — ONDANSETRON 4 MG PO TBDP
4.0000 mg | ORAL_TABLET | Freq: Three times a day (TID) | ORAL | Status: DC | PRN
  Administered 2024-04-28 – 2024-04-29 (×2): 4 mg via ORAL
  Filled 2024-04-28 (×2): qty 1

## 2024-04-28 MED ORDER — ONDANSETRON HCL 4 MG/2ML IJ SOLN: 4.0000 mg | Freq: Three times a day (TID) | INTRAMUSCULAR | Status: DC | PRN

## 2024-04-28 MED ORDER — ONDANSETRON 4 MG PO TBDP
ORAL_TABLET | ORAL | Status: AC
  Filled 2024-04-28: qty 1

## 2024-04-28 NOTE — Progress Notes (Signed)
 Patient c/o nausea/vomiting and stomach ache. Reported to this writer at shift change. Day nurse did a secure chat with FNP. This clinical research associate also sent secure chat to night shift NPs. Orders received and zofran  4mg  po given. Patient assisted to change cloths and floor clean up. Environmental services mopped floor. Pt refused to take her night time cogentin  and refused to have blood glucose, through finger stick, done. Pt now calm and cooperative laying in bed. B/P 164/71, Pulse 71

## 2024-04-28 NOTE — Progress Notes (Signed)
 Adult Psychoeducational Group Note  Date:  04/28/2024 Time:  11:07 AM  Group Topic/Focus: Nutrition  Group Healthy Communication:   The focus of this group is to discuss communication, barriers to communication, as well as healthy ways to communicate with others.  Participation Level:  Did Not Attend  Participation Quality:    Affect:    Cognitive:    Insight:   Engagement in Group:    Modes of Intervention:    Additional Comments:    Reo Portela Lee 04/28/2024, 11:07 AM
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
## 2024-04-28 NOTE — [facility] Group Notes (Signed)
 Adult Psychoeducational Group Note  Date:  04/28/2024 Time:  1:09 PM  Group Topic/Focus: [employer] Recovery Goals:   The focus of this group is to identify appropriate goals for recovery and establish a plan to achieve them.  Participation Level:  Did Not Attend  Participation Quality:    Affect:    Cognitive:    Insight:   Engagement in Group:    Modes of Intervention:    Additional Comments:    Kamali Sakata Lee 04/28/2024, 1:09 PM

## 2024-04-28 NOTE — [facility] Group Notes (Signed)
 Adult Psychoeducational Group Note  Date:  04/28/2024 Time:  10:46 AM  Group Topic/Focus: Recreation Therapy Wellness Toolbox:   The focus of this group is to discuss various aspects of wellness, balancing those aspects and exploring ways to increase the ability to experience wellness.  Patients will create a wellness toolbox for use upon discharge.  Participation Level:  Did Not Attend  Participation Quality:    Affect:    Cognitive:    Insight:   Engagement in Group:    Modes of Intervention:    Additional Comments:    Kaitlyn Gray 04/28/2024, 10:46 AM

## 2024-04-28 NOTE — Group Note (Signed)
 Date:  04/28/2024 Time:  8:27 PM  Group Topic/Focus:  Wrap-Up Group:   The focus of this group is to help patients review their daily goal of treatment and discuss progress on daily workbooks.    Participation Level:  Did Not Attend   Meilah Delrosario Dacosta 04/28/2024, 8:27 PM

## 2024-04-28 NOTE — Plan of Care (Signed)
  Problem: Education: Goal: Knowledge of [facility] Education information/materials will improve Outcome: Progressing Goal: Emotional status will improve Outcome: Progressing Goal: Mental status will improve Outcome: Progressing Goal: Verbalization of understanding the information provided will improve Outcome: Progressing   Problem: Activity: Goal: Interest or engagement in activities will improve Outcome: Progressing Goal: Sleeping patterns will improve Outcome: Progressing   Problem: Coping: Goal: Ability to verbalize frustrations and anger appropriately will improve Outcome: Progressing Goal: Ability to demonstrate self-control will improve Outcome: Progressing   Problem: Health Behavior/Discharge Planning: Goal: Identification of resources available to assist in meeting health care needs will improve Outcome: Progressing Goal: Compliance with treatment plan for underlying cause of condition will improve Outcome: Progressing   Problem: Physical Regulation: Goal: Ability to maintain clinical measurements within normal limits will improve Outcome: Progressing   Problem: Safety: Goal: Periods of time without injury will increase Outcome: Progressing   Problem: Education: Goal: Ability to describe self-care measures that may prevent or decrease complications (Diabetes Survival Skills Education) will improve Outcome: Progressing Goal: Individualized Educational Video(s) Outcome: Progressing   Problem: Coping: Goal: Ability to adjust to condition or change in health will improve Outcome: Progressing   Problem: Fluid Volume: Goal: Ability to maintain a balanced intake and output will improve Outcome: Progressing   Problem: Health Behavior/Discharge Planning: Goal: Ability to identify and utilize available resources and services will improve Outcome: Progressing Goal: Ability to manage health-related needs will improve Outcome: Progressing   Problem:  Metabolic: Goal: Ability to maintain appropriate glucose levels will improve Outcome: Progressing   Problem: Nutritional: Goal: Maintenance of adequate nutrition will improve Outcome: Progressing Goal: Progress toward achieving an optimal weight will improve Outcome: Progressing   Problem: Skin Integrity: Goal: Risk for impaired skin integrity will decrease Outcome: Progressing   Problem: Tissue Perfusion: Goal: Adequacy of tissue perfusion will improve Outcome: Progressing

## 2024-04-28 NOTE — Progress Notes (Signed)
   04/28/24 1100  Psych Admission Type (Psych Patients Only)  Admission Status Voluntary  Psychosocial Assessment  Patient Complaints None  Eye Contact Brief  Facial Expression Flat  Affect Flat;Labile  Speech Logical/coherent;Slurred;Incoherent  Interaction Isolative;Assertive  Motor Activity Unsteady  Appearance/Hygiene Disheveled  Behavior Characteristics Cooperative;Irritable  Mood Depressed;Irritable  Thought Process  Coherency Circumstantial  Content Preoccupation  Delusions None reported or observed  Perception WDL  Hallucination None reported or observed  Judgment Poor  Confusion None  Danger to Self  Current suicidal ideation? Denies  Self-Injurious Behavior No self-injurious ideation or behavior indicators observed or expressed   Agreement Not to Harm Self Yes  Description of Agreement verbal  Danger to Others  Danger to Others None reported or observed

## 2024-04-28 NOTE — Progress Notes (Signed)
 Recreation Therapy Notes  Patient admitted to unit 11.10.25. Due to admission within last year, no new recreation therapy assessment conducted at this time. Last assessment conducted on 9.19.25.    Reason for current admission per patient, suicidal ideation.  Patient reports stressors are losing her phone and trying to get her check back because she hasn't gotten it in two months.  Patient reports goal I don't know.  Patient denies SI and HI at this time.  Patient expresses hearing voices say things to her.    Information found below from assessment conducted 9.19.25.  Coping Skills: Music, Deep Breathing, Meditate, Prayer, Avoidance, Read  Leisure Interests: Walking, Thinking  Patient Strengths: Loving Self, Loving her children  Area of Improvement: not really   Gayatri Teasdale-McCall, LRT,CTRS Mynor Witkop A Hyrum Shaneyfelt-McCall 04/28/2024 1:08 PM

## 2024-04-28 NOTE — Progress Notes (Signed)
 Pt spoke with NP at this time. Pt verbalizes feeling upset because she is not being discharged. Pt began yelling I want to go home! Pt threw cup of Gatorade out of her room. Pt then took bag of pretzels and poured them onto floor in front of office where NP is occupying. Staff redirected pts behavior and was able to verbally deescalate pt.

## 2024-04-28 NOTE — [facility] Group Notes (Signed)
 Adult Psychoeducational Group Note  Date:  04/28/2024 Time:  3:58 PM  Group Topic/Focus: Occupational Therapy Self Care:   The focus of this group is to help patients understand the importance of self-care in order to improve or restore emotional, physical, spiritual, interpersonal, and financial health.  Participation Level:  Did Not Attend  Participation Quality:    Affect:   Cognitive:    Insight:   Engagement in Group:    Modes of Intervention:    Additional Comments:    Callia Swim Lee 04/28/2024, 3:58 PM

## 2024-04-28 NOTE — Group Note (Signed)
 Recreation Therapy Group Note   Group Topic:Healthy Decision Making  Group Date: 04/28/2024 Start Time: 1015 End Time: 1040 Facilitators: Dhruvan Gullion-McCall, LRT,CTRS Location: [address]   Group Topic: Decision Making, Problem Solving, Communication  Goal Area(s) Addresses:  Patient will effectively work with peer towards shared goal.  Patient will identify factors that guided their decision making.  Patient will pro-socially communicate ideas during group session.   Behavioral Response:   Intervention: Survival Scenario - pencil, paper  Activity: Patients were given a scenario that they were going to be stranded in a [place] for several months before being rescued. Writer tasked them with making a list of 15 things they would choose to bring with them for survival. The list of items was prioritized most important to least. Each patient would come up with their own list, then work together to create a new list of 15 items while in a group of 3-5 peers. LRT discussed each person's list and how it differed from others. The debrief included discussion of priorities, good decisions versus bad decisions, and how it is important to think before acting so we can make the best decision possible. LRT tied the concept of effective communication among group members to patient's support systems outside of the hospital and its benefit post discharge.  Education: Pharmacist, Community, Priorities, Support System, Discharge Planning   Education Outcome: Acknowledges education/In group clarification/Needs additional education   Affect/Mood: N/A   Participation Level: Did not attend    Clinical Observations/Individualized Feedback:      Plan: Continue to engage patient in RT group sessions 2-3x/week.   Zissy Hamlett-McCall, LRT,CTRS 04/28/2024 12:19 PM

## 2024-04-28 NOTE — Progress Notes (Addendum)
(  Sleep Hours) -6.75 (Any PRNs that were needed, meds refused, or side effects to meds)- prn tylenol  and mylanta @ 2118 for c/o stomachache:  Pt refused cogentin , and clonidine  (Any disturbances and when (visitation, over night)-none (Concerns raised by the patient)- none (SI/HI/AVH)- denies all  Pt refused to have fingerstick done last evening and this am

## 2024-04-29 DIAGNOSIS — F259 Schizoaffective disorder, unspecified: Principal | ICD-10-CM

## 2024-04-29 DIAGNOSIS — Z8673 Personal history of transient ischemic attack (TIA), and cerebral infarction without residual deficits: Secondary | ICD-10-CM

## 2024-04-29 DIAGNOSIS — F1910 Other psychoactive substance abuse, uncomplicated: Secondary | ICD-10-CM

## 2024-04-29 DIAGNOSIS — I10 Essential (primary) hypertension: Secondary | ICD-10-CM

## 2024-04-29 LAB — GLUCOSE, CAPILLARY: Glucose-Capillary: 135 mg/dL — ABNORMAL HIGH (ref 70–99)

## 2024-04-29 MED ORDER — ONDANSETRON 4 MG PO TBDP
4.0000 mg | ORAL_TABLET | Freq: Once | ORAL | Status: AC
  Administered 2024-04-29: 4 mg via ORAL

## 2024-04-29 NOTE — Progress Notes (Signed)
 Pt vomited clear emesis. Provider notified. New orders noted, pt received zofran .

## 2024-04-29 NOTE — Group Note (Signed)
 Recreation Therapy Group Note   Group Topic:Problem Solving  Group Date: 04/29/2024 Start Time: 1040 End Time: 1100 Facilitators: Ameli Sangiovanni-McCall, LRT,CTRS Location: [address] Day[address]   Group Topic: Problem Solving  Goal Area(s) Addresses:  Patient will verbalize importance of using appropriate problem solving techniques.  Patient will identify positive change associated with effective problem solving skills.   Behavioral Response:   Intervention: Worksheet, Pencils  Activity: [employer] Patients were given a worksheet with 24 words relevant to fall scrambled. Patients were to unscramble the letters to identify the word.    Education Outcome: Acknowledges understanding/In group clarification offered/Needs additional education.    Affect/Mood: N/A   Participation Level: Did not attend    Clinical Observations/Individualized Feedback:     Plan: Continue to engage patient in RT group sessions 2-3x/week.   Leonardo Plaia-McCall, LRT,CTRS 04/29/2024 1:03 PM

## 2024-04-29 NOTE — Progress Notes (Addendum)
(  Sleep Hours) -3.75 as of 0530 (Any PRNs that were needed, meds refused, or side effects to meds)- refused bedtime cogentin :  prn zofran  @ 2128 for N/V (Any disturbances and when (visitation, over night)-none (Concerns raised by the patient)- none (SI/HI/AVH)- denies all  B/P taken per monitor at 154/94, P 87. Pt is asymptomatic.

## 2024-04-29 NOTE — Progress Notes (Signed)
Pt vomited moderate amount of emesis.

## 2024-04-29 NOTE — Group Note (Signed)
 Date:  04/29/2024 Time:  9:18 AM  Group Topic/Focus:  Goals Group:   The focus of this group is to help patients establish daily goals to achieve during treatment and discuss how the patient can incorporate goal setting into their daily lives to aide in recovery.    Participation Level:  Did Not Attend  Participation Quality:  NA  Affect:  NA  Cognitive:  NA  Insight: NA  Engagement in Group:  NA  Modes of Intervention:  NA  Additional Comments:  Patient did not attend Goals Group.  Rosaleen BIRCH Kendria Halberg 04/29/2024, 9:18 AM

## 2024-04-29 NOTE — Group Note (Deleted)
 Date:  04/29/2024 Time:  9:16 AM  Group Topic/Focus:  Goals Group:   The focus of this group is to help patients establish daily goals to achieve during treatment and discuss how the patient can incorporate goal setting into their daily lives to aide in recovery.    Participation Level:  Did Not Attend  Participation Quality:  NA  Affect:  NA  Cognitive:  NA  Insight: NA  Engagement in Group:  NA  Modes of Intervention:  NA  Additional Comments:  Patient did not attend Goals Group.  Rosaleen BIRCH Chyrl Elwell 04/29/2024, 9:16 AM

## 2024-04-29 NOTE — Plan of Care (Signed)
   Problem: Education: Goal: Knowledge of [place] [facility] Education information/materials will improve Outcome: Progressing Goal: Emotional status will improve Outcome: Progressing Goal: Mental status will improve Outcome: Progressing
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
## 2024-04-29 NOTE — Plan of Care (Signed)
  Problem: Education: Goal: Knowledge of [facility] Education information/materials will improve Outcome: Progressing Goal: Emotional status will improve Outcome: Progressing Goal: Mental status will improve Outcome: Progressing Goal: Verbalization of understanding the information provided will improve Outcome: Progressing   Problem: Activity: Goal: Interest or engagement in activities will improve Outcome: Progressing   Problem: Coping: Goal: Ability to demonstrate self-control will improve Outcome: Progressing

## 2024-04-29 NOTE — Group Note (Signed)
 Date:  04/29/2024 Time:  12:02 PM  Group Topic/Focus: Physical Wellness Self Care:   The focus of this group is to help patients understand the importance of self-care in order to improve or restore emotional, physical, spiritual, interpersonal, and financial health.    Participation Level:  Did Not Attend  Participation Quality:  NA  Affect:  NA  Cognitive:  NA  Insight: NA  Engagement in Group:  NA  Modes of Intervention:  NA  Additional Comments:  Patient did not attend Physical Wellness Group.  Rosaleen BIRCH [place] 04/29/2024, 12:02 PM

## 2024-04-29 NOTE — Group Note (Signed)
 Date:  04/29/2024 Time:  8:14 PM  Group Topic/Focus:  Wrap-Up Group:   The focus of this group is to help patients review their daily goal of treatment and discuss progress on daily workbooks.    Participation Level:  Did Not Attend    Kaitlyn Gray 04/29/2024, 8:14 PM

## 2024-04-29 NOTE — [facility] Group Notes (Signed)
 Patient did not attend Spiritual Wellness Group by [place].

## 2024-04-29 NOTE — [facility] Group Notes (Signed)
 Patient did not attend Recreational Therapy Group.

## 2024-04-29 NOTE — Progress Notes (Addendum)
 Nursing Note:  Pt quiet throughout the shift, no complaints until 1445, c/o stomach pain and nausea after she was told she would be discharged tomorrow am. Zofran  4mg  PO administered as ordered along with ginger ale which pt was encouraged to sip. Pt shared that she had a BM an hour ago.  Pt then came to this RN to share she wanted mil of Magnesia for her stomach. MOM 30ml as ordered given.  Pt then asked for tums for her bloated stomach. Encouraged pt to allow prior meds to take effect before adding another at this time.

## 2024-04-29 NOTE — Plan of Care (Signed)
   Problem: Education: Goal: Knowledge of [place] [facility] Education information/materials will improve Outcome: Progressing Goal: Emotional status will improve Outcome: Progressing Goal: Mental status will improve Outcome: Progressing Goal: Verbalization of understanding the information provided will improve Outcome: Progressing

## 2024-04-29 NOTE — Progress Notes (Signed)
   04/29/24 1000  Psych Admission Type (Psych Patients Only)  Admission Status Voluntary  Psychosocial Assessment  Patient Complaints None  Eye Contact Brief  Facial Expression Flat  Affect Flat  Speech Logical/coherent  Interaction Cautious  Motor Activity Slow  Appearance/Hygiene Disheveled  Behavior Characteristics Calm  Mood Depressed  Thought Process  Coherency WDL  Content WDL  Delusions None reported or observed  Perception WDL  Hallucination None reported or observed  Judgment Poor  Confusion None  Danger to Self  Current suicidal ideation? Denies  Agreement Not to Harm Self Yes  Description of Agreement Verbal  Danger to Others  Danger to Others None reported or observed

## 2024-04-30 MED ORDER — CLONIDINE HCL 0.1 MG PO TABS
0.1000 mg | ORAL_TABLET | Freq: Once | ORAL | Status: AC
  Administered 2024-04-30: 0.1 mg via ORAL

## 2024-04-30 MED ORDER — METFORMIN HCL 500 MG PO TABS
500.0000 mg | ORAL_TABLET | Freq: Two times a day (BID) | ORAL | 0 refills | Status: DC
  Filled 2024-04-30: qty 60, 30d supply, fill #0

## 2024-04-30 MED ORDER — BENZTROPINE MESYLATE 1 MG PO TABS
1.0000 mg | ORAL_TABLET | Freq: Two times a day (BID) | ORAL | 0 refills | Status: DC
  Filled 2024-04-30: qty 60, 30d supply, fill #0

## 2024-04-30 MED ORDER — ARIPIPRAZOLE ER 400 MG IM SRER
400.0000 mg | INTRAMUSCULAR | 0 refills | Status: DC
  Filled 2024-04-30: qty 1, 28d supply, fill #0

## 2024-04-30 MED ORDER — CLONIDINE HCL 0.1 MG PO TABS
ORAL_TABLET | ORAL | Status: AC
  Filled 2024-04-30: qty 1

## 2024-04-30 NOTE — Group Note (Deleted)
 Date:  04/30/2024 Time:  8:35 AM  Group Topic/Focus: Social wellness and orientation goals Group Goals Group:   The focus of this group is to help patients establish daily goals to achieve during treatment and discuss how the patient can incorporate goal setting into their daily lives to aide in recovery. Orientation:   The focus of this group is to educate the patient on the purpose and policies of crisis stabilization and provide a format to answer questions about their admission.  The group details unit policies and expectations of patients while admitted.     Participation Level:  {[facility] PARTICIPATION OZCZO:77735}  Participation Quality:  {[facility] PARTICIPATION QUALITY:22265}  Affect:  {B[facility] AFFECT:22266}  Cognitive:  {[facility] COGNITIVE:22267}  Insight: {B[facility] Insight2:20797}  Engagement in Group:  {[facility] ENGAGEMENT IN HMNLE:77731}  Modes of Intervention:  {[facility] MODES OF INTERVENTION:22269}  Additional Comments:  ***  Kaitlyn Gray Kaitlyn Gray 04/30/2024, 8:35 AM

## 2024-04-30 NOTE — Progress Notes (Signed)
 Patient BP at 164/101, NP notified , new orders received for clonidine 

## 2024-04-30 NOTE — Progress Notes (Addendum)
(  Sleep Hours) -8.75 as of 0530 (Any PRNs that were needed, meds refused, or side effects to meds)- refused bedtime cogentin  (Any disturbances and when (visitation, over night)-none (Concerns raised by the patient)- none (SI/HI/AVH)- denies all  BP this am 164/101, clonidine  0.1mg  po given per one time order

## 2024-04-30 NOTE — Progress Notes (Signed)
 Patient verbalizes readiness for discharge. All patient belongings returned to patient. Discharge instructions read and discussed with patient (appointments, medications, resources). Patient expressed gratitude for care provided. Patient discharged to lobby at 1315 where taxi driver was waiting.

## 2024-04-30 NOTE — BHH Suicide Risk Assessment (Signed)
 [facility] Discharge Suicide Risk Assessment   Principal Problem: Schizoaffective disorder [facility]) Discharge Diagnoses: Principal Problem:   Schizoaffective disorder (HCC) Active Problems:   Polysubstance abuse (HCC)   Hypertension   History of stroke   Demographic Factors:  Living alone and Unemployed  Loss Factors: Financial problems/change in socioeconomic status  Historical Factors: Prior suicide attempts and Impulsivity  Risk Reduction Factors:   Positive social support  Continued Clinical Symptoms:  Schizoaffective disorder  Cognitive Features That Contribute To Risk:  None    Suicide Risk:  Mild:  Suicidal ideation of limited frequency, intensity, duration, and specificity.  There are no identifiable plans, no associated intent, mild dysphoria and related symptoms, good self-control (both objective and subjective assessment), few other risk factors, and identifiable protective factors, including available and accessible social support.   Follow-up Information     [facility]. Schedule an appointment as soon as possible for a visit on 05/03/2024.   Specialty: Behavioral Health Why: Please call this provider on 05/03/24 at 9:00 am to schedule an appointment for the substance abuse intensive outpatient program meets in-person M, W, F from 9 am to 12 pm and runs for 8 to 12 weeks. Clients can also receive individual and family therapy and MAT (medication assisted treatment). There is weekly drug testing. The program is abstinence-based and AA, NA, Smart Recovery, etc. attendance is encouraged. For questions, please call [phone]. Contact information: [address] [place]  [address] [phone]        [facility] Follow up.   Why: You may also call this provider to schedule an appointment for therapy and medication management services.  The appointment will be Virtual. Contact information: [address]  [address] [place]  [place] [address] [phone]                 Kaitlyn DELENA Salmon, DO 04/30/2024, 12:27 PM

## 2024-04-30 NOTE — Progress Notes (Signed)
 04/30/2024  Kaitlyn Gray DOB: [birth year] MRN: [id number]   RIDER WAIVER AND RELEASE OF LIABILITY  For the purposes of helping with transportation needs, [facility] partners with outside transportation providers (taxi companies, [place], catering manager.) to give [facility] patients or other approved people the choice of on-demand rides Public Librarian) to our buildings for non-emergency visits.  By using [employer], I, the person signing this document, on behalf of myself and/or any legal minors (in my care using the [employer]), agree:  Science Writer given to me are supplied by independent, outside transportation providers who do not work for, or have any affiliation with, [employer]. [place] is not a transportation company. [facility] has no control over the quality or safety of the rides I get using [employer]. [place] has no control over whether any outside ride will happen on time or not. [facility] gives no guarantee on the reliability, quality, safety, or availability on any rides, or that no mistakes will happen. I know and accept that traveling by vehicle (car, truck, SVU, fleeta, bus, taxi, etc.) has risks of serious injuries such as disability, being paralyzed, and death. I know and agree the risk of using [employer] is mine alone, and not [employer]. [employer] are provided as is and as are available. The transportation providers are in charge for all inspections and care of the vehicles used to provide these rides. I agree not to take legal action against [place], its agents, employees, officers, directors, representatives, insurers, attorneys, assigns, successors, subsidiaries, and affiliates at any time for any reasons related directly or indirectly to using [employer]. I also agree not to take legal action against [place] or its affiliates for any injury, death, or damage to property caused by or related to using  [employer]. I have read this Waiver and Release of Liability, and I understand the terms used in it and their legal meaning. This Waiver is freely and voluntarily given with the understanding that my right (or any legal minors) to legal action against [place] relating to [employer] is knowingly given up to use these services.   I attest that I read the Ride Waiver and Release of Liability to Kaitlyn Gray, gave Ms. Weider the opportunity to ask questions and answered the questions asked (if any). I affirm that Kaitlyn Gray then provided consent for assistance with transportation.      Patient signed original, original will be scanned to medical records.

## 2024-04-30 NOTE — Progress Notes (Signed)
  [facility] Adult Case Management Discharge Plan :  Will you be returning to the same living situation after discharge:  Yes, patient will be returning home.  At discharge, do you have transportation home?: Yes,  CSW arranged taxi Do you have the ability to pay for your medications: Yes,  PRIMARY INS: [employer] / [employer]  Release of information consent forms completed and in the chart;  Patient's signature needed at discharge.  Patient to Follow up at:  Follow-up Information     [facility] Schedule an appointment as soon as possible for a visit on 05/03/2024.   Specialty: Behavioral Health Why: Please call this provider on 05/03/24 at 9:00 am to schedule an appointment for the substance abuse intensive outpatient program meets in-person M, W, F from 9 am to 12 pm and runs for 8 to 12 weeks. Clients can also receive individual and family therapy and MAT (medication assisted treatment). There is weekly drug testing. The program is abstinence-based and AA, NA, Smart Recovery, etc. attendance is encouraged. For questions, please call [phone]. Contact information: [address] [place]  [id number] [phone]        [facility] Follow up.   Why: You may also call this provider to schedule an appointment for therapy and medication management services.  The appointment will be Virtual. Contact information: [address]  [address] [place] [address] [phone]                 Next level of care provider has access to [facility] Link:no  Safety Planning and Suicide Prevention discussed: Yes,  CSW spoke with patient. Patient acknowledged protocol and risk assessment.       Has patient been referred to the Quitline?: Yes, faxed/e-referral on CSW submitted referral to quit line   Patient has been referred for addiction treatment: Yes, the patient will follow up with an outpatient provider for substance use  disorder. Therapist: patient to schedule appointment  Gaylan Homans, [place]RA[place] 04/30/2024, 9:53 AM

## 2024-04-30 NOTE — BHH Suicide Risk Assessment (Signed)
 [facility] INPATIENT:  Family/Significant Other Suicide Prevention Education  Suicide Prevention Education:  Education Completed; Annabella Fuse,  (name of family member/significant other) has been identified by the patient as the family member/significant other with whom the patient will be residing, and identified as the person(s) who will aid the patient in the event of a mental health crisis (suicidal ideations/suicide attempt).  With written consent from the patient, the family member/significant other has been provided the following suicide prevention education, prior to the and/or following the discharge of the patient.  The suicide prevention education provided includes the following: Suicide risk factors Suicide prevention and interventions National Suicide Hotline telephone number [facility] assessment telephone number [facility] Emergency Assistance 911 [facility] and/or Residential Mobile Crisis Unit telephone number  Request made of family/significant other to: Remove weapons (e.g., guns, rifles, knives), all items previously/currently identified as safety concern.   Remove drugs/medications (over-the-counter, prescriptions, illicit drugs), all items previously/currently identified as a safety concern.  The family member/significant other verbalizes understanding of the suicide prevention education information provided.  The family member/significant other agrees to remove the items of safety concern listed above.  CSW spoke with the patient. Patient acknowledged and agreed with protocol and risk assessment.  Gaylan Homans, LCSWA 04/30/2024, 9:49 AM

## 2024-04-30 NOTE — Plan of Care (Signed)
  Problem: Education: Goal: Knowledge of [place] [facility] Education information/materials will improve Outcome: Progressing Goal: Emotional status will improve Outcome: Progressing Goal: Mental status will improve Outcome: Progressing Goal: Verbalization of understanding the information provided will improve Outcome: Progressing   Problem: Activity: Goal: Interest or engagement in activities will improve Outcome: Progressing   Problem: Coping: Goal: Ability to demonstrate self-control will improve Outcome: Progressing   Problem: Safety: Goal: Periods of time without injury will increase Outcome: Progressing

## 2024-05-02 ENCOUNTER — Other Ambulatory Visit: Payer: Self-pay

## 2024-05-02 ENCOUNTER — Other Ambulatory Visit (HOSPITAL_COMMUNITY): Payer: Self-pay

## 2024-05-02 NOTE — Progress Notes (Signed)
 Spiritual care group facilitated by Chaplain Dyanne Carrel, [facility]  Group focused on topic of strength. Group members reflected on what thoughts and feelings emerge when they hear this topic. They then engaged in facilitated dialog around how strength is present in their lives. This dialog focused on representing what strength had been to them in their lives (images and patterns given) and what they saw as helpful in their life now (what they needed / wanted).  Activity drew on narrative framework.  Patient Progress: Did not attend.

## 2024-05-03 ENCOUNTER — Other Ambulatory Visit: Payer: Self-pay
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
## 2024-05-06 ENCOUNTER — Emergency Department (HOSPITAL_COMMUNITY): Payer: MEDICAID

## 2024-05-06 ENCOUNTER — Other Ambulatory Visit: Payer: Self-pay

## 2024-05-06 ENCOUNTER — Emergency Department (HOSPITAL_COMMUNITY)
Admission: EM | Admit: 2024-05-06 | Discharge: 2024-05-07 | Disposition: A | Discharge status: Other Acute Inpatient Hospital | Payer: MEDICAID | Attending: Emergency Medicine | Admitting: Emergency Medicine

## 2024-05-06 ENCOUNTER — Encounter (HOSPITAL_COMMUNITY): Payer: Self-pay | Admitting: *Deleted

## 2024-05-06 DIAGNOSIS — E876 Hypokalemia: Secondary | ICD-10-CM

## 2024-05-06 DIAGNOSIS — Z79899 Other long term (current) drug therapy: Secondary | ICD-10-CM | POA: Insufficient documentation

## 2024-05-06 DIAGNOSIS — F1410 Cocaine abuse, uncomplicated: Secondary | ICD-10-CM | POA: Diagnosis not present

## 2024-05-06 DIAGNOSIS — Z91040 Latex allergy status: Secondary | ICD-10-CM | POA: Insufficient documentation

## 2024-05-06 DIAGNOSIS — F250 Schizoaffective disorder, bipolar type: Secondary | ICD-10-CM | POA: Insufficient documentation

## 2024-05-06 DIAGNOSIS — R519 Headache, unspecified: Secondary | ICD-10-CM | POA: Diagnosis not present

## 2024-05-06 DIAGNOSIS — R079 Chest pain, unspecified: Secondary | ICD-10-CM

## 2024-05-06 DIAGNOSIS — F1990 Other psychoactive substance use, unspecified, uncomplicated: Secondary | ICD-10-CM

## 2024-05-06 DIAGNOSIS — R0789 Other chest pain: Secondary | ICD-10-CM | POA: Insufficient documentation

## 2024-05-06 DIAGNOSIS — E119 Type 2 diabetes mellitus without complications: Secondary | ICD-10-CM | POA: Diagnosis not present

## 2024-05-06 DIAGNOSIS — Z7984 Long term (current) use of oral hypoglycemic drugs: Secondary | ICD-10-CM | POA: Diagnosis not present

## 2024-05-06 DIAGNOSIS — R059 Cough, unspecified: Secondary | ICD-10-CM | POA: Insufficient documentation

## 2024-05-06 DIAGNOSIS — I10 Essential (primary) hypertension: Secondary | ICD-10-CM | POA: Insufficient documentation

## 2024-05-06 DIAGNOSIS — R45851 Suicidal ideations: Secondary | ICD-10-CM | POA: Diagnosis not present

## 2024-05-06 DIAGNOSIS — F1490 Cocaine use, unspecified, uncomplicated: Secondary | ICD-10-CM | POA: Diagnosis not present

## 2024-05-06 LAB — CBC WITH DIFFERENTIAL/PLATELET
Abs Immature Granulocytes: 0.01 K/uL (ref 0.00–0.07)
Basophils Absolute: 0 K/uL (ref 0.0–0.1)
Basophils Relative: 1 %
Eosinophils Absolute: 0.9 K/uL — ABNORMAL HIGH (ref 0.0–0.5)
Eosinophils Relative: 16 %
HCT: 32.9 % — ABNORMAL LOW (ref 36.0–46.0)
Hemoglobin: 9.9 g/dL — ABNORMAL LOW (ref 12.0–15.0)
Immature Granulocytes: 0 %
Lymphocytes Relative: 34 %
Lymphs Abs: 1.8 K/uL (ref 0.7–4.0)
MCH: 24 pg — ABNORMAL LOW (ref 26.0–34.0)
MCHC: 30.1 g/dL (ref 30.0–36.0)
MCV: 79.7 fL — ABNORMAL LOW (ref 80.0–100.0)
Monocytes Absolute: 0.4 K/uL (ref 0.1–1.0)
Monocytes Relative: 7 %
Neutro Abs: 2.3 K/uL (ref 1.7–7.7)
Neutrophils Relative %: 42 %
Platelets: 390 K/uL (ref 150–400)
RBC: 4.13 MIL/uL (ref 3.87–5.11)
RDW: 20.7 % — ABNORMAL HIGH (ref 11.5–15.5)
WBC: 5.3 K/uL (ref 4.0–10.5)
nRBC: 0 % (ref 0.0–0.2)

## 2024-05-06 LAB — TROPONIN T, HIGH SENSITIVITY: Troponin T High Sensitivity: <15 ng/L (ref 0–19)

## 2024-05-06 LAB — HCG, QUANTITATIVE, PREGNANCY: hCG, Beta Chain, Quant, S: <1 m[IU]/mL (ref <5)

## 2024-05-06 LAB — COMPREHENSIVE METABOLIC PANEL WITH GFR
ALT: 15 U/L (ref 0–44)
AST: 17 U/L (ref 15–41)
Albumin: 4 g/dL (ref 3.5–5.0)
Alkaline Phosphatase: 75 U/L (ref 38–126)
Anion gap: 9 (ref 5–15)
BUN: 11 mg/dL (ref 6–20)
CO2: 28 mmol/L (ref 22–32)
Calcium: 9.4 mg/dL (ref 8.9–10.3)
Chloride: 106 mmol/L (ref 98–111)
Creatinine, Ser: 0.96 mg/dL (ref 0.44–1.00)
GFR, Estimated: >60 mL/min (ref >60)
Glucose, Bld: 129 mg/dL — ABNORMAL HIGH (ref 70–99)
Potassium: 3.3 mmol/L — ABNORMAL LOW (ref 3.5–5.1)
Sodium: 142 mmol/L (ref 135–145)
Total Bilirubin: 0.4 mg/dL (ref 0.0–1.2)
Total Protein: 7.5 g/dL (ref 6.5–8.1)

## 2024-05-06 LAB — ETHANOL: Alcohol, Ethyl (B): <15 mg/dL (ref <15)

## 2024-05-06 LAB — RESP PANEL BY RT-PCR (RSV, FLU A&B, COVID)  RVPGX2
Influenza A by PCR: NEGATIVE
Influenza B by PCR: NEGATIVE
Resp Syncytial Virus by PCR: NEGATIVE
SARS Coronavirus 2 by RT PCR: NEGATIVE

## 2024-05-06 MED ORDER — METOCLOPRAMIDE HCL 5 MG/ML IJ SOLN: 10.0000 mg | Freq: Once | INTRAMUSCULAR | Status: DC

## 2024-05-06 MED ORDER — LORAZEPAM 1 MG PO TABS: 1.0000 mg | ORAL_TABLET | ORAL | Status: DC | PRN

## 2024-05-06 MED ORDER — RISPERIDONE 0.5 MG PO TBDP: 2.0000 mg | ORAL_TABLET | Freq: Three times a day (TID) | ORAL | Status: DC | PRN

## 2024-05-06 MED ORDER — ACETAMINOPHEN 325 MG PO TABS: 650.0000 mg | ORAL_TABLET | ORAL | Status: DC | PRN

## 2024-05-06 MED ORDER — LORAZEPAM 2 MG/ML IJ SOLN: 1.0000 mg | Freq: Once | INTRAMUSCULAR | Status: DC

## 2024-05-06 MED ORDER — ZIPRASIDONE MESYLATE 20 MG IM SOLR: 20.0000 mg | INTRAMUSCULAR | Status: DC | PRN

## 2024-05-06 NOTE — ED Notes (Signed)
 Pt has been changed into burgundy scrubs and has been wanded by security.

## 2024-05-06 NOTE — ED Notes (Addendum)
 Pt redirected to room. Pt agitated because the hospital lost her phone four months ago.

## 2024-05-06 NOTE — ED Triage Notes (Signed)
 BIB [facility] from home for feeling ill, productive cough, congestion, chest tightness x 1 month. Enroute with EMS pt endorses command AVH to kill self, and pulled self off of monitor and became less cooperative. . On arrival pt asks for blankets. Pt alert, NAD, calm, interactive, speech clear and intelligible, sitting in w/c. EMS reports poor living conditions, including: no water , power or food. Also reports:  hasn't had bipolar meds in 1 week.

## 2024-05-06 NOTE — ED Provider Notes (Addendum)
 [place] EMERGENCY DEPARTMENT AT [facility] Provider Note   CSN: [id number] Arrival date & time: 05/06/24  1721     Patient presents with: Medical Clearance   Kaitlyn Gray is a 48 y.o. female.   HPI     48 year old patient comes in with chief complaint of chest pain, headache and suicidal ideation.  Patient reports that she has history of diabetes, hypertension, polysubstance use including crack, meth.  Patient reports that she has been having chest discomfort,, congestion, cough for the last month.  She just has been feeling sick.  She also has had some headache, without any associated new neurologic symptoms such as one-sided weakness, numbness, slurred speech.  Headache is generalized and more severe than chest pain.  Patient reports that she has been using crack and meth.  She denies IV drug use.  She also admits to not taking her medication.  Additionally patient is having SI.  Prior to Admission medications   Medication Sig Start Date End Date Taking? Authorizing Provider  ARIPiprazole  ER (ABILIFY  MAINTENA) 400 MG SRER injection Inject 2 mLs (400 mg total) into the muscle every 28 (twenty-eight) days. 05/23/24   Prentis Kitchens A, DO  benztropine  (COGENTIN ) 1 MG tablet Take 1 tablet (1 mg total) by mouth 2 (two) times daily. 04/30/24   Prentis Kitchens LABOR, DO  hydrOXYzine  (ATARAX ) 50 MG tablet Take 1 tablet (50 mg total) by mouth 3 (three) times daily as needed for anxiety. 03/09/24   Collene Gouge I, NP  losartan  (COZAAR ) 25 MG tablet Take 1 tablet (25 mg total) by mouth daily. For hypertension. 03/10/24   Collene Gouge I, NP  metFORMIN  (GLUCOPHAGE ) 500 MG tablet Take 1 tablet (500 mg total) by mouth 2 (two) times daily with a meal. For diabetes management. Patient not taking: Reported on 04/25/2024 03/09/24   Collene Gouge I, NP  metFORMIN  (GLUCOPHAGE ) 500 MG tablet Take 1 tablet (500 mg total) by mouth 2 (two) times daily with a meal. 04/30/24   Bouchard, Marc A,  DO  QUEtiapine  (SEROQUEL ) 200 MG tablet Take 1 tablet (200 mg total) by mouth at bedtime. For mood control 03/09/24   Collene Gouge I, NP  QUEtiapine  (SEROQUEL ) 50 MG tablet Take 1 tablet (50 mg total) by mouth 2 (two) times daily. For agitation. 03/09/24   Collene Gouge I, NP    Allergies: Tomato, Hydrocodone , and Latex    Review of Systems  All other systems reviewed and are negative.   Updated Vital Signs BP (!) 141/88   Pulse 66   Temp 97.7 F (36.5 C) (Oral)   Resp 20   Wt 65.8 kg   LMP 05/06/2024   SpO2 98%   BMI 24.89 kg/m   Physical Exam Vitals and nursing note reviewed.  Constitutional:      Appearance: She is well-developed.  HENT:     Head: Atraumatic.  Eyes:     Extraocular Movements: Extraocular movements intact.     Pupils: Pupils are equal, round, and reactive to light.     Comments: No nystagmus  Cardiovascular:     Rate and Rhythm: Normal rate.     Heart sounds: Murmur heard.  Pulmonary:     Effort: Pulmonary effort is normal.  Musculoskeletal:     Cervical back: Normal range of motion and neck supple.  Skin:    General: Skin is warm and dry.  Neurological:     Mental Status: She is alert and oriented to person, place, and time.  Cranial Nerves: No cranial nerve deficit.     Sensory: No sensory deficit.     (all labs ordered are listed, but only abnormal results are displayed) Labs Reviewed  COMPREHENSIVE METABOLIC PANEL WITH GFR - Abnormal; Notable for the following components:      Result Value   Potassium 3.3 (*)    Glucose, Bld 129 (*)    All other components within normal limits  CBC WITH DIFFERENTIAL/PLATELET - Abnormal; Notable for the following components:   Hemoglobin 9.9 (*)    HCT 32.9 (*)    MCV 79.7 (*)    MCH 24.0 (*)    RDW 20.7 (*)    Eosinophils Absolute 0.9 (*)    All other components within normal limits  RESP PANEL BY RT-PCR (RSV, FLU A&B, COVID)  [id number]  ETHANOL  HCG, QUANTITATIVE, PREGNANCY  URINE DRUG SCREEN   TROPONIN T, HIGH SENSITIVITY    EKG: EKG Interpretation Date/Time:  Friday May 06 2024 18:44:29 EST Ventricular Rate:  61 PR Interval:  138 QRS Duration:  88 QT Interval:  424 QTC Calculation: 426 R Axis:   61  Text Interpretation: Sinus rhythm with marked sinus arrhythmia Otherwise normal ECG When compared with ECG of 25-Apr-2024 07:01, PREVIOUS ECG IS PRESENT Confirmed by Charlyn Sora ([id number]) on 05/06/2024 6:53:47 PM  Radiology: [facility] Chest 2 View Result Date: 05/06/2024 EXAM: 2 VIEW(S) XRAY OF THE CHEST 05/06/2024 09:20:24 PM COMPARISON: 03/24/2024 CLINICAL HISTORY: cough cough FINDINGS: LUNGS AND PLEURA: Show inspiration. Linear opacities in the lung bases likely represent vascular crowding rather than atelectasis. No focal consolidation. No pleural effusion. No pneumothorax. HEART AND MEDIASTINUM: Mild cardiac enlargement. Mediastinal contours appear intact. BONES AND SOFT TISSUES: No acute osseous abnormality. IMPRESSION: 1. No acute cardiopulmonary process. 2. Mild cardiomegaly. Electronically signed by: Elsie Gravely MD 05/06/2024 09:23 PM EST RP Workstation: [id number]     Procedures   Medications Ordered in the ED  metoCLOPramide  (REGLAN ) injection 10 mg (has no administration in time range)  LORazepam  (ATIVAN ) injection 1 mg (has no administration in time range)  risperiDONE  (RISPERDAL  M-TABS) disintegrating tablet 2 mg (has no administration in time range)    And  LORazepam  (ATIVAN ) tablet 1 mg (has no administration in time range)    And  ziprasidone  (GEODON ) injection 20 mg (has no administration in time range)  acetaminophen  (TYLENOL ) tablet 650 mg (has no administration in time range)                                    Medical Decision Making Amount and/or Complexity of Data Reviewed Labs: ordered. Radiology: ordered.  Risk OTC drugs. Prescription drug management.   Patient comes to the ER with cc of suicidal ideation along with chest  discomfort, headache. Patient has pertinent past medical history of diabetes, hypertension, polysubstance use disorder and psychiatric illness. Currently patient is calm, cooperative.  She does not appear to be psychotic.  Pt denies nausea, emesis, fevers, chills, abdominal pain, uti like symptoms. I have reviewed previous encounters for this patient and reviewed their primary medications. Patient was just admitted by psychiatry service last week.  Differential diagnosis considered for this patient includes: Acute coronary syndrome Pulmonary edema Pneumonia Pleural effusion Depression Bipolar disorder Schizophrenia Substance abuse Suicidal ideation Acute withdrawal  I have lower suspicion for dissection or brain bleed.  I have reviewed patient's previous CT scans and MRI.  MRI brain on 9-16 was normal.  With her having reassuring neurologic exam -we will not get a CT or MRI at this time.  Basic labs including troponins and EKG were ordered.  They are all normal.  Patient is medically cleared for psychiatric evaluation.    Final diagnoses:  Suicidal ideation  Recurrent headache  Chest pain, unspecified type  Substance use disorder    ED Discharge Orders     None           Charlyn Sora, MD 05/06/24 2356

## 2024-05-06 NOTE — BH Assessment (Signed)
 Patient was deferred to [facility] for a telepsych assessment. The assigned care coordinator will provide updates regarding the scheduling of the assessment. [facility] coordinator can be reached at [phone] for further information on the timing of the telepsych evaluation.

## 2024-05-06 NOTE — ED Notes (Signed)
 Pt calm after given two biscuits, saltines, and one sprite.

## 2024-05-06 NOTE — ED Notes (Signed)
 Pt is refusing to allow RN to draw her labs for her repeat Troponin. Dr. Charlyn informed.

## 2024-05-06 NOTE — ED Notes (Signed)
 Pt appears relaxed and has started resting.

## 2024-05-07 ENCOUNTER — Inpatient Hospital Stay (HOSPITAL_COMMUNITY)
Admission: AD | Admit: 2024-05-07 | Discharge: 2024-05-12 | DRG: 885 | Disposition: A | Discharge status: Home / Self Care | Payer: MEDICAID | Source: Intra-hospital

## 2024-05-07 ENCOUNTER — Encounter (HOSPITAL_COMMUNITY): Payer: Self-pay | Admitting: Psychiatry

## 2024-05-07 DIAGNOSIS — F17210 Nicotine dependence, cigarettes, uncomplicated: Secondary | ICD-10-CM | POA: Diagnosis present

## 2024-05-07 DIAGNOSIS — E119 Type 2 diabetes mellitus without complications: Secondary | ICD-10-CM | POA: Diagnosis present

## 2024-05-07 DIAGNOSIS — I10 Essential (primary) hypertension: Secondary | ICD-10-CM | POA: Diagnosis present

## 2024-05-07 DIAGNOSIS — F4324 Adjustment disorder with disturbance of conduct: Secondary | ICD-10-CM | POA: Diagnosis present

## 2024-05-07 DIAGNOSIS — Z885 Allergy status to narcotic agent status: Secondary | ICD-10-CM | POA: Diagnosis not present

## 2024-05-07 DIAGNOSIS — F419 Anxiety disorder, unspecified: Secondary | ICD-10-CM | POA: Diagnosis present

## 2024-05-07 DIAGNOSIS — Z79899 Other long term (current) drug therapy: Secondary | ICD-10-CM

## 2024-05-07 DIAGNOSIS — Z5982 Transportation insecurity: Secondary | ICD-10-CM | POA: Diagnosis not present

## 2024-05-07 DIAGNOSIS — Z9151 Personal history of suicidal behavior: Secondary | ICD-10-CM

## 2024-05-07 DIAGNOSIS — F1910 Other psychoactive substance abuse, uncomplicated: Secondary | ICD-10-CM | POA: Diagnosis present

## 2024-05-07 DIAGNOSIS — Z91148 Patient's other noncompliance with medication regimen for other reason: Secondary | ICD-10-CM

## 2024-05-07 DIAGNOSIS — L309 Dermatitis, unspecified: Secondary | ICD-10-CM | POA: Diagnosis present

## 2024-05-07 DIAGNOSIS — Z5948 Other specified lack of adequate food: Secondary | ICD-10-CM | POA: Diagnosis not present

## 2024-05-07 DIAGNOSIS — R45851 Suicidal ideations: Secondary | ICD-10-CM | POA: Diagnosis present

## 2024-05-07 DIAGNOSIS — Z7984 Long term (current) use of oral hypoglycemic drugs: Secondary | ICD-10-CM | POA: Diagnosis not present

## 2024-05-07 DIAGNOSIS — F250 Schizoaffective disorder, bipolar type: Secondary | ICD-10-CM

## 2024-05-07 DIAGNOSIS — I69322 Dysarthria following cerebral infarction: Secondary | ICD-10-CM

## 2024-05-07 DIAGNOSIS — F4310 Post-traumatic stress disorder, unspecified: Secondary | ICD-10-CM | POA: Diagnosis present

## 2024-05-07 DIAGNOSIS — F1490 Cocaine use, unspecified, uncomplicated: Secondary | ICD-10-CM | POA: Diagnosis present

## 2024-05-07 DIAGNOSIS — E669 Obesity, unspecified: Secondary | ICD-10-CM | POA: Diagnosis present

## 2024-05-07 DIAGNOSIS — Z5941 Food insecurity: Secondary | ICD-10-CM | POA: Diagnosis not present

## 2024-05-07 DIAGNOSIS — Z9152 Personal history of nonsuicidal self-harm: Secondary | ICD-10-CM | POA: Diagnosis not present

## 2024-05-07 DIAGNOSIS — Z91018 Allergy to other foods: Secondary | ICD-10-CM

## 2024-05-07 DIAGNOSIS — Z8249 Family history of ischemic heart disease and other diseases of the circulatory system: Secondary | ICD-10-CM | POA: Diagnosis not present

## 2024-05-07 DIAGNOSIS — Z91040 Latex allergy status: Secondary | ICD-10-CM

## 2024-05-07 DIAGNOSIS — E785 Hyperlipidemia, unspecified: Secondary | ICD-10-CM | POA: Diagnosis present

## 2024-05-07 DIAGNOSIS — Z6824 Body mass index (BMI) 24.0-24.9, adult: Secondary | ICD-10-CM

## 2024-05-07 DIAGNOSIS — F17290 Nicotine dependence, other tobacco product, uncomplicated: Secondary | ICD-10-CM | POA: Diagnosis present

## 2024-05-07 LAB — URINE DRUG SCREEN
Amphetamines: NEGATIVE
Barbiturates: NEGATIVE
Benzodiazepines: NEGATIVE
Cocaine: POSITIVE — AB
Fentanyl: NEGATIVE
Methadone Scn, Ur: NEGATIVE
Opiates: NEGATIVE
Tetrahydrocannabinol: POSITIVE — AB

## 2024-05-07 LAB — GLUCOSE, CAPILLARY: Glucose-Capillary: 118 mg/dL — ABNORMAL HIGH (ref 70–99)

## 2024-05-07 MED ORDER — DIPHENHYDRAMINE HCL 25 MG PO CAPS
50.0000 mg | ORAL_CAPSULE | Freq: Three times a day (TID) | ORAL | Status: DC | PRN
  Administered 2024-05-08 – 2024-05-10 (×3): 50 mg via ORAL
  Filled 2024-05-07 (×3): qty 2

## 2024-05-07 MED ORDER — ALUM & MAG HYDROXIDE-SIMETH 200-200-20 MG/5ML PO SUSP: 30.0000 mL | ORAL | Status: DC | PRN

## 2024-05-07 MED ORDER — HALOPERIDOL LACTATE 5 MG/ML IJ SOLN: 10.0000 mg | Freq: Three times a day (TID) | INTRAMUSCULAR | Status: DC | PRN

## 2024-05-07 MED ORDER — DIPHENHYDRAMINE HCL 50 MG/ML IJ SOLN: 50.0000 mg | Freq: Three times a day (TID) | INTRAMUSCULAR | Status: DC | PRN

## 2024-05-07 MED ORDER — INSULIN ASPART 100 UNIT/ML IJ SOLN
0.0000 [IU] | Freq: Three times a day (TID) | INTRAMUSCULAR | Status: DC
  Administered 2024-05-08: 2 [IU] via SUBCUTANEOUS

## 2024-05-07 MED ORDER — HALOPERIDOL 5 MG PO TABS
5.0000 mg | ORAL_TABLET | Freq: Three times a day (TID) | ORAL | Status: DC | PRN
  Administered 2024-05-08 – 2024-05-10 (×3): 5 mg via ORAL
  Filled 2024-05-07 (×3): qty 1

## 2024-05-07 MED ORDER — HYDROXYZINE HCL 50 MG PO TABS
50.0000 mg | ORAL_TABLET | Freq: Four times a day (QID) | ORAL | Status: DC | PRN
  Administered 2024-05-07 – 2024-05-11 (×4): 50 mg via ORAL
  Filled 2024-05-07 (×5): qty 1

## 2024-05-07 MED ORDER — MAGNESIUM HYDROXIDE 400 MG/5ML PO SUSP: 30.0000 mL | Freq: Every day | ORAL | Status: DC | PRN

## 2024-05-07 MED ORDER — INSULIN ASPART 100 UNIT/ML IJ SOLN: 0.0000 [IU] | Freq: Every day | INTRAMUSCULAR | Status: DC

## 2024-05-07 MED ORDER — QUETIAPINE FUMARATE 200 MG PO TABS
200.0000 mg | ORAL_TABLET | Freq: Every day | ORAL | Status: DC
  Administered 2024-05-07: 200 mg via ORAL
  Filled 2024-05-07: qty 1

## 2024-05-07 MED ORDER — QUETIAPINE FUMARATE 50 MG PO TABS: 50.0000 mg | ORAL_TABLET | Freq: Every day | ORAL | Status: DC

## 2024-05-07 MED ORDER — LORAZEPAM 2 MG/ML IJ SOLN: 2.0000 mg | Freq: Three times a day (TID) | INTRAMUSCULAR | Status: DC | PRN

## 2024-05-07 MED ORDER — QUETIAPINE FUMARATE 100 MG PO TABS
200.0000 mg | ORAL_TABLET | Freq: Every day | ORAL | Status: DC
  Administered 2024-05-07: 200 mg via ORAL
  Filled 2024-05-07: qty 2

## 2024-05-07 MED ORDER — HALOPERIDOL LACTATE 5 MG/ML IJ SOLN: 5.0000 mg | Freq: Three times a day (TID) | INTRAMUSCULAR | Status: DC | PRN

## 2024-05-07 MED ORDER — HYDROCERIN EX CREA
TOPICAL_CREAM | Freq: Two times a day (BID) | CUTANEOUS | Status: DC
  Administered 2024-05-12: 1 via TOPICAL
  Filled 2024-05-07 (×2): qty 113

## 2024-05-07 MED ORDER — QUETIAPINE FUMARATE 50 MG PO TABS
50.0000 mg | ORAL_TABLET | Freq: Every day | ORAL | Status: DC
  Administered 2024-05-07: 50 mg via ORAL
  Filled 2024-05-07: qty 1

## 2024-05-07 MED ORDER — HYDROXYZINE HCL 25 MG PO TABS: 50.0000 mg | ORAL_TABLET | Freq: Four times a day (QID) | ORAL | Status: DC | PRN

## 2024-05-07 MED ORDER — LOSARTAN POTASSIUM 25 MG PO TABS
25.0000 mg | ORAL_TABLET | Freq: Every day | ORAL | Status: DC
  Administered 2024-05-07 – 2024-05-08 (×2): 25 mg via ORAL
  Filled 2024-05-07 (×2): qty 1

## 2024-05-07 MED ORDER — BENZTROPINE MESYLATE 1 MG PO TABS
1.0000 mg | ORAL_TABLET | Freq: Two times a day (BID) | ORAL | Status: DC
  Administered 2024-05-07: 1 mg via ORAL
  Filled 2024-05-07 (×2): qty 1

## 2024-05-07 MED ORDER — BENZTROPINE MESYLATE 1 MG PO TABS
1.0000 mg | ORAL_TABLET | Freq: Two times a day (BID) | ORAL | Status: DC
  Administered 2024-05-10 – 2024-05-12 (×3): 1 mg via ORAL
  Filled 2024-05-07 (×6): qty 1

## 2024-05-07 MED ORDER — POTASSIUM CHLORIDE 20 MEQ PO PACK
20.0000 meq | PACK | Freq: Once | ORAL | Status: AC
  Administered 2024-05-07: 20 meq via ORAL
  Filled 2024-05-07: qty 1

## 2024-05-07 MED ORDER — METFORMIN HCL 500 MG PO TABS
500.0000 mg | ORAL_TABLET | Freq: Two times a day (BID) | ORAL | Status: DC
Start: 2024-05-07 — End: 2024-05-12
  Administered 2024-05-08 – 2024-05-12 (×8): 500 mg via ORAL
  Filled 2024-05-07 (×9): qty 1

## 2024-05-07 MED ORDER — ACETAMINOPHEN 325 MG PO TABS
650.0000 mg | ORAL_TABLET | Freq: Four times a day (QID) | ORAL | Status: DC | PRN
  Administered 2024-05-07: 650 mg via ORAL
  Filled 2024-05-07: qty 2

## 2024-05-07 NOTE — Progress Notes (Signed)
 05/07/2024  1014  Safe transport called to transport patient to [facility].

## 2024-05-07 NOTE — H&P (Signed)
 Psychiatric Admission Assessment Adult  Patient Identification: Kaitlyn [name] MRN:  [id number] Date of Evaluation:  05/07/2024 Chief Complaint:  Schizoaffective disorder (HCC) [F25.9] Principal Diagnosis: Schizoaffective disorder, bipolar type (HCC) Diagnosis:  Principal Problem:   Schizoaffective disorder, bipolar type (HCC) Active Problems:   Polysubstance abuse (HCC)  History of Present Illness:  Kaitlyn [name] is a 48 yr old female who presented on 11/21 to [facility] with chest pain, command hallucinations, and SI in the setting of medication non-compliance and substance abuse,  she was admitted to [facility] on 11/22.  PPHx is significant for Schizoaffective Disorder, Anxiety, PTSD, and Polysubstance Abuse (Cocaine, Meth, THC, Hx of Heroin), and multiple Suicide Attempts, Self Injurious Behavior (cutting), and Multiple Psychiatric Hospitalizations (last- [facility] 04/2024).  When asked what led to her hospitalization she reports that after she was discharged last week she got home and needed to use more of her medication then had been prescribed.  She reports that because of this she ran out of her samples and could not get her prescriptions filled.  She reports she then smoked crack to address her symptoms and this caused the voices to significantly worsen.  She reports that they have been telling her to hurt herself she reports also seeing people telling her to hurt herself.  She reports a past psychiatric history significant for Schizoaffective Disorder, Anxiety, PTSD, and Polysubstance Abuse (Cocaine, Meth, THC, Hx of Heroin).  She reports a history of multiple suicide attempts.  She reports a history of self-injurious behavior-cutting.  She reports history significant for multiple psychiatric hospitalizations-last [facility] 04/2024.  She reports past medical history significant for hypertension, hyperlipidemia, and diabetes.  She reports past medical history significant for C-section.  She reports she has  had a seizure a long time ago.  She reports no history of head trauma.  She reports allergy to hydrocodone -itching.  She reports she lives in a townhome by herself.  She reports she is on disability.  She reports she finished the 10th grade.  She reports no alcohol use.  She reports smoking about 1 cigarette a day.  She reports smoking crack approximately half a gram a day.  She reports she occasionally smokes meth.  She reports THC use.  She reports no heroin use in a while.  She reports no current legal issues.  She reports no access to firearms.  Discussed restarting her medications that she was on during her last hospitalization and she was agreeable with this as she reports the Seroquel  was helpful.  She reports having significant itching from her eczema and requests Eucerin cream.  When asked she reports she has never been to residential rehab but is interested in more information.   Associated Signs/Symptoms: Depression Symptoms:  depressed mood, insomnia, fatigue, feelings of worthlessness/guilt, hopelessness, suicidal thoughts without plan, anxiety, panic attacks, loss of energy/fatigue, disturbed sleep, (Hypo) Manic Symptoms:  Reports None Anxiety Symptoms:  Excessive Worry, Panic Symptoms, Psychotic Symptoms:  Hallucinations: Auditory Visual PTSD Symptoms: Re-experiencing:  Nightmares Total Time spent with patient: 1 hour  Past Psychiatric History:  Schizoaffective Disorder, Anxiety, PTSD, and Polysubstance Abuse (Cocaine, Meth, THC, Hx of Heroin), and multiple Suicide Attempts, Self Injurious Behavior (cutting), and Multiple Psychiatric Hospitalizations (last- [facility] 04/2024).  Is the patient at risk to self? Yes.    Has the patient been a risk to self in the past 6 months? Yes.    Has the patient been a risk to self within the distant past? No.  Is the patient a  risk to others? No.  Has the patient been a risk to others in the past 6 months? No.  Has the patient been a  risk to others within the distant past? No.   Columbia Scale:  Flowsheet Row ED from 05/06/2024 in [facility] Emergency Department at [facility] Admission (Discharged) from 04/25/2024 in [facility] INPATIENT ADULT 500B ED from 04/24/2024 in [facility] Emergency Department at [facility]  C-SSRS RISK CATEGORY No Risk High Risk High Risk     Prior Inpatient Therapy: Yes.   If yes, describe [facility] 04/2024  Prior Outpatient Therapy: No. If yes, describe N/A   Alcohol Screening: 1. How often do you have a drink containing alcohol?: Never 2. How many drinks containing alcohol do you have on a typical day when you are drinking?: 1 or 2 3. How often do you have six or more drinks on one occasion?: Never AUDIT-C Score: 0 4. How often during the last year have you found that you were not able to stop drinking once you had started?: Never 5. How often during the last year have you failed to do what was normally expected from you because of drinking?: Never 6. How often during the last year have you needed a first drink in the morning to get yourself going after a heavy drinking session?: Never 7. How often during the last year have you had a feeling of guilt of remorse after drinking?: Never 8. How often during the last year have you been unable to remember what happened the night before because you had been drinking?: Never 9. Have you or someone else been injured as a result of your drinking?: No 10. Has a relative or friend or a doctor or another health worker been concerned about your drinking or suggested you cut down?: No Alcohol Use Disorder Identification Test Final Score (AUDIT): 0 Substance Abuse History in the last 12 months:  Yes.   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    Encounter for assessment of healthcare decision-making capacity    History of cervical fracture 12/24/2017   nondisplaced fracture lateral mass of C1 on the right on CT 12/24/17   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) 07/19/2017   Hypertension    Hypertensive emergency 05/31/2022   Ischemic stroke (HCC) 01/01/2020   subacute right middle cerebellar peduncle and pons infarction   Left-sided weakness 01/27/2022   Head CT with remote right occipital infarct which is consistent with old left-sided weakness   MDD (major depressive disorder), recurrent severe, 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   Normocytic anemia 02/09/2020   Opiate use    History of Suboxone  Therapy until 05/2021   Polysubstance abuse (HCC) 07/19/2017   Prescription drug misadventures (Seroquel )     Past Surgical History:  Procedure Laterality Date   CESAREAN SECTION     Family History:  Family History  Problem Relation Age of Onset   Hypertension Mother    CAD Mother 77       died of MI at age 53  Hypertension Father    Family Psychiatric  History:  Mother, Wylie, Higinio- Substance Abuse No Known Diagnosis' or Suicides  Tobacco Screening:  Social History   Tobacco Use  Smoking Status Every Day   Current packs/day: 1.00   Average packs/day: 1 pack/day for 25.9 years (25.9 ttl pk-yrs)   Types: Cigarettes   Start date: 06/16/1998   Passive exposure: Current  Smokeless Tobacco Former    BH Tobacco Counseling     Are you interested in Tobacco Cessation Medications?  Yes, implement Nicotene Replacement Protocol Counseled patient on smoking cessation:  Refused/Declined practical counseling Reason Tobacco  Screening Not Completed: No value filed.       Social History:  Social History   Substance and Sexual Activity  Alcohol Use Not Currently     Social History   Substance and Sexual Activity  Drug Use Yes   Types: Marijuana, Cocaine, Crack cocaine   Comment: occassionally    Additional Social History:                           Allergies:   Allergies  Allergen Reactions   Tomato Anaphylaxis and Other (See Comments)    Pt reports this as an allergy, but has been eating ketchup and pizza on previous visits w/o any s/s of allergic reaction/anaphylaxis.    Hydrocodone  Itching   Latex Itching and Rash   Lab Results:  Results for orders placed or performed during the hospital encounter of 05/06/24 (from the past 48 hours)  Ethanol     Status: None   Collection Time: 05/06/24  6:30 PM  Result Value Ref Range   Alcohol, Ethyl (B) <15 <15 mg/dL    Comment: (NOTE) For medical purposes only. Performed at [facility], [address], [place], [place] [address]   Comprehensive metabolic panel     Status: Abnormal   Collection Time: 05/06/24  6:38 PM  Result Value Ref Range   Sodium 142 135 - 145 mmol/L   Potassium 3.3 (L) 3.5 - 5.1 mmol/L   Chloride 106 98 - 111 mmol/L   CO2 28 22 - 32 mmol/L   Glucose, Bld 129 (H) 70 - 99 mg/dL    Comment: Glucose reference range applies only to samples taken after fasting for at least 8 hours.   BUN 11 6 - 20 mg/dL   Creatinine, Ser 9.03 0.44 - 1.00 mg/dL   Calcium  9.4 8.9 - 10.3 mg/dL   Total Protein 7.5 6.5 - 8.1 g/dL   Albumin 4.0 3.5 - 5.0 g/dL   AST 17 15 - 41 U/L   ALT 15 0 - 44 U/L   Alkaline Phosphatase 75 38 - 126 U/L   Total Bilirubin 0.4 0.0 - 1.2 mg/dL   GFR, Estimated >39 >39 mL/min    Comment: (NOTE) Calculated using the CKD-EPI Creatinine Equation (2021)    Anion gap 9 5 - 15    Comment: Performed at [facility], [address] [place], [place] [address]  CBC with  Diff     Status: Abnormal   Collection Time: 05/06/24  6:38 PM  Result Value Ref Range   WBC 5.3 4.0 - 10.5 K/uL   RBC 4.13 3.87 - 5.11 MIL/uL   Hemoglobin 9.9 (L) 12.0 - 15.0 g/dL   HCT 67.0 (L) 63.9 - 53.9 %   MCV 79.7 (L) 80.0 - 100.0 fL   MCH 24.0 (L) 26.0 - 34.0 pg  MCHC 30.1 30.0 - 36.0 g/dL   RDW 79.2 (H) 88.4 - 84.4 %   Platelets 390 150 - 400 K/uL   nRBC 0.0 0.0 - 0.2 %   Neutrophils Relative % 42 %   Neutro Abs 2.3 1.7 - 7.7 K/uL   Lymphocytes Relative 34 %   Lymphs Abs 1.8 0.7 - 4.0 K/uL   Monocytes Relative 7 %   Monocytes Absolute 0.4 0.1 - 1.0 K/uL   Eosinophils Relative 16 %   Eosinophils Absolute 0.9 (H) 0.0 - 0.5 K/uL   Basophils Relative 1 %   Basophils Absolute 0.0 0.0 - 0.1 K/uL   Immature Granulocytes 0 %   Abs Immature Granulocytes 0.01 0.00 - 0.07 K/uL    Comment: Performed at [facility], [address] [place], [place] [address]  hCG, quantitative, pregnancy     Status: None   Collection Time: 05/06/24  6:39 PM  Result Value Ref Range   hCG, Beta Chain, Quant, S <1 <5 mIU/mL    Comment:          GEST. AGE      CONC.  (mIU/mL)   <=1 WEEK        5 - 50     2 WEEKS       50 - 500     3 WEEKS       100 - 10,000     4 WEEKS     1,000 - 30,000     5 WEEKS     3,500 - 115,000   6-8 WEEKS     12,000 - 270,000    12 WEEKS     15,000 - 220,000        FEMALE AND NON-PREGNANT FEMALE:     LESS THAN 5 mIU/mL Performed at [facility], [address] [place], [place] [address]   Troponin T, High Sensitivity     Status: None   Collection Time: 05/06/24  6:49 PM  Result Value Ref Range   Troponin T High Sensitivity <15 0 - 19 ng/L    Comment: (NOTE) Biotin concentrations > 1000 ng/mL falsely decrease TnT results.  Serial cardiac troponin measurements are suggested.  Refer to the Links section for chest pain algorithms and additional  guidance. Performed at [facility], [address] [place], [place] [address]   Resp panel by RT-PCR (RSV, Flu A&B, Covid) Anterior Nasal Swab     Status: None   Collection Time: 05/06/24  8:40 PM   Specimen: Anterior Nasal Swab  Result Value Ref Range   SARS Coronavirus 2 by RT PCR NEGATIVE NEGATIVE    Comment: (NOTE) SARS-CoV-2 target nucleic acids are NOT DETECTED.  The SARS-CoV-2 RNA is generally detectable in upper respiratory specimens during the acute phase of infection. The lowest concentration of SARS-CoV-2 viral copies this assay can detect is 138 copies/mL. A negative result does not preclude SARS-Cov-2 infection and should not be used as the sole basis for treatment or other patient management decisions. A negative result may occur with  improper specimen collection/handling, submission of specimen other than nasopharyngeal swab, presence of viral mutation(s) within the areas targeted by this assay, and inadequate number of viral copies(<138 copies/mL). A negative result must be combined with clinical observations, patient history, and epidemiological information. The expected result is Negative.  Fact Sheet for Patients:  bloggercourse.com  Fact Sheet for Healthcare Providers:  seriousbroker.it  This test is no t yet approved or  cleared by the [place]  FDA and  has been authorized for detection and/or diagnosis of SARS-CoV-2 by FDA under an Emergency Use Authorization (EUA). This EUA will remain  in effect (meaning this test can be used) for the duration of the COVID-19 declaration under Section 564(b)(1) of the Act, 21 U.S.C.section 360bbb-3(b)(1), unless the authorization is terminated  or revoked sooner.       Influenza A by PCR NEGATIVE NEGATIVE   Influenza B by PCR NEGATIVE NEGATIVE    Comment: (NOTE) The Xpert Xpress SARS-CoV-2/FLU/RSV plus assay is intended as an aid in the diagnosis of influenza from Nasopharyngeal swab specimens and should not be  used as a sole basis for treatment. Nasal washings and aspirates are unacceptable for Xpert Xpress SARS-CoV-2/FLU/RSV testing.  Fact Sheet for Patients: bloggercourse.com  Fact Sheet for Healthcare Providers: seriousbroker.it  This test is not yet approved or cleared by the [place]  FDA and has been authorized for detection and/or diagnosis of SARS-CoV-2 by FDA under an Emergency Use Authorization (EUA). This EUA will remain in effect (meaning this test can be used) for the duration of the COVID-19 declaration under Section 564(b)(1) of the Act, 21 U.S.C. section 360bbb-3(b)(1), unless the authorization is terminated or revoked.     Resp Syncytial Virus by PCR NEGATIVE NEGATIVE    Comment: (NOTE) Fact Sheet for Patients: bloggercourse.com  Fact Sheet for Healthcare Providers: seriousbroker.it  This test is not yet approved or cleared by the [place]  FDA and has been authorized for detection and/or diagnosis of SARS-CoV-2 by FDA under an Emergency Use Authorization (EUA). This EUA will remain in effect (meaning this test can be used) for the duration of the COVID-19 declaration under Section 564(b)(1) of the Act, 21 U.S.C. section 360bbb-3(b)(1), unless the authorization is terminated or revoked.  Performed at [facility], [address], [place], [place] [address]   Urine rapid drug screen (hosp performed)     Status: Abnormal   Collection Time: 05/07/24  3:26 AM  Result Value Ref Range   Opiates NEGATIVE NEGATIVE   Cocaine POSITIVE (A) NEGATIVE   Benzodiazepines NEGATIVE NEGATIVE   Amphetamines NEGATIVE NEGATIVE   Tetrahydrocannabinol POSITIVE (A) NEGATIVE   Barbiturates NEGATIVE NEGATIVE   Methadone Scn, Ur NEGATIVE NEGATIVE   Fentanyl  NEGATIVE NEGATIVE    Comment: (NOTE) Drug screen is for Medical Purposes only. Positive results  are preliminary only. If confirmation is needed, notify lab within 5 days.  Drug Class                 Cutoff (ng/mL) Amphetamine and metabolites 1000 Barbiturate and metabolites 200 Benzodiazepine              200 Opiates and metabolites     300 Cocaine and metabolites     300 THC                         50 Fentanyl                     5 Methadone                   300  Trazodone  is metabolized in vivo to several metabolites,  including pharmacologically active m-CPP, which is excreted in the  urine.  Immunoassay screens for amphetamines and MDMA have potential  cross-reactivity with these compounds and may provide false positive  result.  Performed at [facility], [address]  [place], [place] [address]     Blood Alcohol level:  Lab Results  Component Value Date   [facility] <15 05/06/2024   ETH <15 04/24/2024    Metabolic Disorder Labs:  Lab Results  Component Value Date   HGBA1C 5.9 (H) 04/27/2024   MPG 122.63 04/27/2024   MPG 108.28 10/23/2023   Lab Results  Component Value Date   PROLACTIN 20.7 12/23/2022   Lab Results  Component Value Date   CHOL 154 04/27/2024   TRIG 53 04/27/2024   HDL 59 04/27/2024   CHOLHDL 2.6 04/27/2024   VLDL 11 04/27/2024   LDLCALC 85 04/27/2024   LDLCALC 64 10/23/2023    Current Medications: Current Facility-Administered Medications  Medication Dose Route Frequency Provider Last Rate Last Admin   acetaminophen  (TYLENOL ) tablet 650 mg  650 mg Oral Q6H PRN Motley-Mangrum, Jadeka A, PMHNP       alum & mag hydroxide-simeth (MAALOX/MYLANTA) 200-200-20 MG/5ML suspension 30 mL  30 mL Oral Q4H PRN Motley-Mangrum, Jadeka A, PMHNP       benztropine  (COGENTIN ) tablet 1 mg  1 mg Oral BID Motley-Mangrum, Jadeka A, PMHNP       haloperidol  (HALDOL ) tablet 5 mg  5 mg Oral TID PRN Motley-Mangrum, Jadeka A, PMHNP       And   diphenhydrAMINE  (BENADRYL ) capsule 50 mg  50 mg Oral TID PRN Motley-Mangrum, Jadeka A, PMHNP        haloperidol  lactate (HALDOL ) injection 5 mg  5 mg Intramuscular TID PRN Motley-Mangrum, Jadeka A, PMHNP       And   diphenhydrAMINE  (BENADRYL ) injection 50 mg  50 mg Intramuscular TID PRN Motley-Mangrum, Jadeka A, PMHNP       And   LORazepam  (ATIVAN ) injection 2 mg  2 mg Intramuscular TID PRN Motley-Mangrum, Jadeka A, PMHNP       haloperidol  lactate (HALDOL ) injection 10 mg  10 mg Intramuscular TID PRN Motley-Mangrum, Jadeka A, PMHNP       And   diphenhydrAMINE  (BENADRYL ) injection 50 mg  50 mg Intramuscular TID PRN Motley-Mangrum, Jadeka A, PMHNP       And   LORazepam  (ATIVAN ) injection 2 mg  2 mg Intramuscular TID PRN Motley-Mangrum, Jadeka A, PMHNP       hydrocerin (EUCERIN) cream   Topical BID Haley Fuerstenberg S, DO       hydrOXYzine  (ATARAX ) tablet 50 mg  50 mg Oral Q6H PRN Motley-Mangrum, Jadeka A, PMHNP       insulin  aspart (novoLOG ) injection 0-15 Units  0-15 Units Subcutaneous TID WC Loxley Cibrian S, DO       insulin  aspart (novoLOG ) injection 0-5 Units  0-5 Units Subcutaneous QHS Azaylah Stailey S, DO       losartan  (COZAAR ) tablet 25 mg  25 mg Oral Daily Motley-Mangrum, Jadeka A, PMHNP       magnesium  hydroxide (MILK OF MAGNESIA) suspension 30 mL  30 mL Oral Daily PRN Motley-Mangrum, Jadeka A, PMHNP       metFORMIN  (GLUCOPHAGE ) tablet 500 mg  500 mg Oral BID WC Motley-Mangrum, Jadeka A, PMHNP       QUEtiapine  (SEROQUEL ) tablet 50 mg  50 mg Oral QHS Motley-Mangrum, Jadeka A, PMHNP       And   QUEtiapine  (SEROQUEL ) tablet 200 mg  200 mg Oral QHS Motley-Mangrum, Jadeka A, PMHNP       PTA Medications: Medications Prior to Admission  Medication Sig Dispense Refill Last Dose/Taking   [START ON 05/23/2024] ARIPiprazole  ER (ABILIFY  MAINTENA) 400 MG SRER injection  Inject 2 mLs (400 mg total) into the muscle every 28 (twenty-eight) days. 1 each 0    benztropine  (COGENTIN ) 1 MG tablet Take 1 tablet (1 mg total) by mouth 2 (two) times daily. 60 tablet 0    hydrOXYzine  (ATARAX ) 50  MG tablet Take 1 tablet (50 mg total) by mouth 3 (three) times daily as needed for anxiety. 90 tablet 0    losartan  (COZAAR ) 25 MG tablet Take 1 tablet (25 mg total) by mouth daily. For hypertension. 30 tablet 0    metFORMIN  (GLUCOPHAGE ) 500 MG tablet Take 1 tablet (500 mg total) by mouth 2 (two) times daily with a meal. For diabetes management. (Patient not taking: Reported on 04/25/2024) 60 tablet 0    metFORMIN  (GLUCOPHAGE ) 500 MG tablet Take 1 tablet (500 mg total) by mouth 2 (two) times daily with a meal. 60 tablet 0    QUEtiapine  (SEROQUEL ) 200 MG tablet Take 1 tablet (200 mg total) by mouth at bedtime. For mood control 30 tablet 0    QUEtiapine  (SEROQUEL ) 50 MG tablet Take 1 tablet (50 mg total) by mouth 2 (two) times daily. For agitation. 60 tablet 0     AIMS:  ,  ,  ,  ,  ,  ,    Musculoskeletal: Strength & Muscle Tone: within normal limits Gait & Station: in wheelchair Patient leans: N/A            Psychiatric Specialty Exam:  Presentation  General Appearance:  Casual  Eye Contact: Fair  Speech: Clear and Coherent; Slow  Speech Volume: Normal  Handedness: Right   Mood and Affect  Mood: Depressed  Affect: Constricted   Thought Process  Thought Processes: Linear  Duration of Psychotic Symptoms:<1 week Past Diagnosis of Schizophrenia or Psychoactive disorder: Yes  Descriptions of Associations:Intact  Orientation:Full (Time, Place and Person)  Thought Content:Delusions  Hallucinations:Hallucinations: Auditory; Visual  Ideas of Reference:None  Suicidal Thoughts:Suicidal Thoughts: Yes, Passive  Homicidal Thoughts:Homicidal Thoughts: No   Sensorium  Memory: Immediate Fair  Judgment: Intact  Insight: Present   Executive Functions  Concentration: Fair  Attention Span: Fair  Recall: Fair  Fund of Knowledge: Fair  Language: Good   Psychomotor Activity  Psychomotor Activity:Psychomotor Activity: -- (in  wheelchair)   Assets  Assets: Resilience   Sleep  Sleep:Sleep: Poor  Estimated Sleeping Duration (Last 24 Hours): 0.00 hours (Due to Daylight Saving Time, the duration displayed may not accurately represent documentation during the time change interval)   Physical Exam: Physical Exam Vitals and nursing note reviewed.  Constitutional:      General: She is not in acute distress.    Appearance: Normal appearance. She is normal weight. She is not ill-appearing or toxic-appearing.  HENT:     Head: Normocephalic and atraumatic.  Pulmonary:     Effort: Pulmonary effort is normal.  Neurological:     Mental Status: She is alert.    Review of Systems  Respiratory:  Negative for cough and shortness of breath.   Cardiovascular:  Negative for chest pain.  Gastrointestinal:  Negative for abdominal pain, constipation, diarrhea, nausea and vomiting.  Neurological:  Positive for headaches. Negative for dizziness and weakness.  Psychiatric/Behavioral:  Positive for depression, hallucinations, substance abuse and suicidal ideas. The patient is not nervous/anxious.    Blood pressure (!) 150/87, pulse 65, resp. rate 16, last menstrual period 05/06/2024, SpO2 98%. There is no height or weight on file to calculate BMI.  Treatment Plan Summary: Daily contact with patient to assess  and evaluate symptoms and progress in treatment and Medication management  Kaitlyn [name] is a 48 yr old female who presented on 11/21 to [facility] with chest pain, command hallucinations, and SI in the setting of medication non-compliance and substance abuse,  she was admitted to [facility] on 11/22.  PPHx is significant for Schizoaffective Disorder, Anxiety, PTSD, and Polysubstance Abuse (Cocaine, Meth, THC, Hx of Heroin), and multiple Suicide Attempts, Self Injurious Behavior (cutting), and Multiple Psychiatric Hospitalizations (last- [facility] 04/2024).   Kashina has a known history of Substance Abuse and Schizoaffective  Disorder.  It appears that she again began using substances once she exhausted her samples from her previous hospitalization so this is most likely Substance Induced worsening of her underlying schizoaffective disorder.  She was restarted on her Seroquel  in the ED so we will see how she responds to this.  She would benefit from Residential Rehab and appreciate Social Works assistance with this.  She would benefit from an ACT Team given her repeated hospitalizations.    Schizoaffective Disorder, Bipolar Type: -Continue Seroquel  250 mg QHS for mood stability and psychosis -Received Abilify  Maintena 400 mg on 11/10 -Continue Agitation Protocol: Haldol /Ativan /Benadryl    Polysubstance Abuse: -Residential Rehab referral    HTN: -Continue Losartan  25 mg daily   Diabetes: -Start Sliding Scale -Continue Metformin  500 mg BID   -Start Eucerin cream for Eczema  -Continue PRN's: Tylenol , Maalox, Atarax , Milk of Magnesia, Trazodone     Observation Level/Precautions:  15 minute checks  Laboratory:  CMP: WNL except K: 3.3, CBC: WNL except Hem: 9.9,  HCT: 32.9, MCV: 79.7,  MCH: 24.0,  RDW: 20.7, Eosin Abs: 0.9,  UDS: Cocaine, THC Pos,  hCG: Neg,  EKG: Sinus Rhythm w/ marked sinus Arrhythmia w/ Qtc: 426 Labs drawn 11/12- A1c: 5.9,  Lipid Panel: WNL  Psychotherapy:    Medications:  Seroquel   Consultations:    Discharge Concerns:    Estimated LOS: 5-7 days  Other:     Physician Treatment Plan for Primary Diagnosis: Schizoaffective disorder, bipolar type (HCC) Long Term Goal(s): Improvement in symptoms so as ready for discharge  Short Term Goals: Ability to identify changes in lifestyle to reduce recurrence of condition will improve, Ability to verbalize feelings will improve, Ability to disclose and discuss suicidal ideas, Ability to demonstrate self-control will improve, Ability to identify and develop effective coping behaviors will improve, Ability to maintain clinical measurements within  normal limits will improve, Compliance with prescribed medications will improve, and Ability to identify triggers associated with substance abuse/mental health issues will improve  Physician Treatment Plan for Secondary Diagnosis: Principal Problem:   Schizoaffective disorder, bipolar type (HCC) Active Problems:   Polysubstance abuse (HCC)  Long Term Goal(s): Improvement in symptoms so as ready for discharge  Short Term Goals: Ability to identify changes in lifestyle to reduce recurrence of condition will improve, Ability to verbalize feelings will improve, Ability to disclose and discuss suicidal ideas, Ability to demonstrate self-control will improve, Ability to identify and develop effective coping behaviors will improve, Ability to maintain clinical measurements within normal limits will improve, Compliance with prescribed medications will improve, and Ability to identify triggers associated with substance abuse/mental health issues will improve  I certify that inpatient services furnished can reasonably be expected to improve the patient's condition.    Marsa GORMAN Rosser, DO 11/22/20253:13 PM

## 2024-05-07 NOTE — ED Notes (Signed)
 Assisted pt with placing pads in mesh underwear. Pt is back in bed, sitting up, and watching tv. No other needs at this time.

## 2024-05-07 NOTE — Group Note (Signed)
 Date:  05/07/2024 Time:  5:50 PM  Group Topic/Focus:  Patients created intuitive collages, supporting mindfulness and focus, using the creative process to organize and visually express complex feelings that may be hard to verbalize. The format encouraged interpersonal connection, personal sharing, and peer support, which can foster social engagement and enhance emotional safety.    Participation Level:  Active  Participation Quality:  Appropriate and Attentive  Affect:  Appropriate  Cognitive:  Alert and Appropriate  Insight: Appropriate and Good  Engagement in Group:  Engaged  Modes of Intervention:  Activity, Discussion, Exploration, Rapport Building, Socialization, and Support    Berwyn GORMAN Acosta 05/07/2024, 5:50 PM

## 2024-05-07 NOTE — Group Note (Signed)
 Date:  05/07/2024 Time:  2:01 PM  Group Topic/Focus: Physical Wellness  Participants learned the mechanisms and benefits of deep-breathing techniques and practiced guided breathing exercises. The group engaged in stretching routines and followed along with music-based movement activities to encourage circulation and promote overall physical activation. The session concluded with participants seated and performing chair-based yoga to support relaxation and gentle mobility    Participation Level:  None  Participation Quality:  Resistant  Affect:  Flat  Cognitive:  Alert and Appropriate  Insight: None  Engagement in Group:  None  Modes of Intervention:  Activity, Socialization, and Support  Additional Comments:  Kaitlyn [name] joined the group about the last 15 minutes and observed, declining participation.  [name] 05/07/2024, 2:01 PM

## 2024-05-07 NOTE — Group Note (Signed)
 Date:  05/07/2024 Time:  2:34 PM  Group Topic/Focus: Karaoke  Patients participated in a karaoke-based therapeutic recreation group, singing both individually and together. The activity promoted peer support, confidence-building, social engagement, and positive group cohesion, with participants encouraging one another throughout the session.    Participation Level:  Active  Participation Quality:  Appropriate, Attentive, and Supportive  Affect:  Appropriate  Cognitive:  Alert and Appropriate  Insight: Good and Improving  Engagement in Group:  Engaged, Improving, and Supportive  Modes of Intervention:  Activity, Socialization, and Support  Additional Comments:  Armilda attended and actively participated in [place].  Kristi HERO Britney Newstrom 05/07/2024, 2:34 PM

## 2024-05-07 NOTE — Progress Notes (Signed)
 Kaitlyn [name] is a 48 year old female with a history of Schizoaffective Disorder, Anxiety, PTSD, and Polysubstance Abuse (Cocaine, Meth, THC, Hx of Heroin), and multiple Suicide Attempts and Self Injurious Behavior. When asked what brought her to the hospital, she says that she wanted to she was having thoughts about killing herself so she came to the hospital so that I wouldn't cut myself like last time. You guys said to come to the hospital if I feel like harming myself. She said she has been stressed about her home environment, family, electricity. She says I have been in my apartment for six years and my family don't even want to come see me. I don't clean up because I get tired real fast. I need someone to clean my space. If someone could help me with that I know I can feel a lot better. Her goals are to Learn about my stresses and how to cope. Also how to stay on my meds. I need a month worth my medications, a bus passes to be able to get my medications or a way to get to my prescriptions.  She presented with Auditory and Visual hallucination stating I see and hear black box and black people trying to beat people I know. Patient says she experiences SI all the time. But she denies having ant current plans. She denies HI. Pt's vitals were within her define limits. Skin assessment was completed with Rheebe, RN. Consent form was signed. Patient's items were searched and placed in locker #34. Patient was overall pleasant.

## 2024-05-07 NOTE — [facility] Suicide Risk Assessment (Signed)
 [facility] Admission Suicide Risk Assessment   Nursing information obtained from:    Demographic factors:    Current Mental Status:    Loss Factors:    Historical Factors:    Risk Reduction Factors:     Total Time spent with patient: 1 hour Principal Problem: Schizoaffective disorder, bipolar type (HCC) Diagnosis:  Principal Problem:   Schizoaffective disorder, bipolar type (HCC) Active Problems:   Polysubstance abuse (HCC)  Subjective Data:  Kaitlyn Kaitlyn [name] is a 48 yr old female who presented on 11/21 to [facility] with chest pain, command hallucinations, and SI in the setting of medication non-compliance and substance abuse,  she was admitted to [facility] on 11/22.  PPHx is significant for Schizoaffective Disorder, Anxiety, PTSD, and Polysubstance Abuse (Cocaine, Meth, THC, Hx of Heroin), and multiple Suicide Attempts, Self Injurious Behavior (cutting), and Multiple Psychiatric Hospitalizations (last- [facility] 04/2024).   When asked what led to her hospitalization she reports that after she was discharged last week she got home and needed to use more of her medication then had been prescribed.  She reports that because of this she ran out of her samples and could not get her prescriptions filled.  She reports she then smoked crack to address her symptoms and this caused the voices to significantly worsen.  She reports that they have been telling her to hurt herself she reports also seeing people telling her to hurt herself.   She reports a past psychiatric Kaitlyn [name] significant for Schizoaffective Disorder, Anxiety, PTSD, and Polysubstance Abuse (Cocaine, Meth, THC, Hx of Heroin).  She reports a Kaitlyn [name] of multiple suicide attempts.  She reports a Kaitlyn [name] of self-injurious behavior-cutting.  She reports Kaitlyn [name] significant for multiple psychiatric hospitalizations-last [facility] 04/2024.  She reports past medical Kaitlyn [name] significant for hypertension, hyperlipidemia, and diabetes.  She reports past medical Kaitlyn [name]  significant for C-section.  She reports she has had a seizure a long time ago.  She reports Kaitlyn Kaitlyn [name] of head trauma.  She reports allergy to hydrocodone -itching.   She reports she lives in a townhome by herself.  She reports she is on disability.  She reports she finished the 10th grade.  She reports Kaitlyn [name] alcohol use.  She reports smoking about 1 cigarette a day.  She reports smoking crack approximately half a gram a day.  She reports she occasionally smokes meth.  She reports THC use.  She reports Kaitlyn [name] heroin use in a while.  She reports Kaitlyn [name] current legal issues.  She reports Kaitlyn [name] access to firearms.   Discussed restarting her medications that she was on during her last hospitalization and she was agreeable with this as she reports the Seroquel  was helpful.  She reports having significant itching from her eczema and requests Eucerin cream.  When asked she reports she has never been to residential rehab but is interested in more information.  Continued Clinical Symptoms:  Alcohol Use Disorder Identification Test Final Score (AUDIT): 0 The Alcohol Use Disorders Identification Test, Guidelines for Use in Primary Care, Second Edition.  World Science Writer [facility]). Score between 0-7:  Kaitlyn [name] or low risk or alcohol related problems. Score between 8-15:  moderate risk of alcohol related problems. Score between 16-19:  high risk of alcohol related problems. Score 20 or above:  warrants further diagnostic evaluation for alcohol dependence and treatment.   CLINICAL FACTORS:   Alcohol/Substance Abuse/Dependencies More than one psychiatric diagnosis Currently Psychotic Unstable or Poor Therapeutic Relationship Previous Psychiatric Diagnoses and Treatments Medical Diagnoses and Treatments/Surgeries Schizoaffective Disorder  Musculoskeletal:  Strength & Muscle Tone: within normal limits Gait & Station: normal Patient leans: N/A  Psychiatric Specialty Exam:  Presentation  General Appearance:   Casual  Eye Contact: Fair  Speech: Clear and Coherent; Slow  Speech Volume: Normal  Handedness: Right   Mood and Affect  Mood: Depressed  Affect: Constricted   Thought Process  Thought Processes: Linear  Descriptions of Associations:Intact  Orientation:Full (Time, Place and Person)  Thought Content:Delusions  Kaitlyn [name] of Schizophrenia/Schizoaffective disorder:Yes  Duration of Psychotic Symptoms:Less than six months  Hallucinations:Hallucinations: Auditory; Visual  Ideas of Reference:None  Suicidal Thoughts:Suicidal Thoughts: Yes, Passive  Homicidal Thoughts:Homicidal Thoughts: Kaitlyn [name]   Sensorium  Memory: Immediate Fair  Judgment: Intact  Insight: Present   Executive Functions  Concentration: Fair  Attention Span: Fair  Recall: Fair  Fund of Knowledge: Fair  Language: Good   Psychomotor Activity  Psychomotor Activity: Psychomotor Activity: -- (in wheelchair)   Assets  Assets: Resilience   Sleep  Sleep: Sleep: Poor    Physical Exam: Physical Exam Vitals and nursing note reviewed.  Constitutional:      General: She is not in acute distress.    Appearance: Normal appearance. She is normal weight. She is not ill-appearing or toxic-appearing.  HENT:     Head: Normocephalic and atraumatic.  Pulmonary:     Effort: Pulmonary effort is normal.  Neurological:     Mental Status: She is alert.    Review of Systems  Respiratory:  Negative for cough and shortness of breath.   Cardiovascular:  Negative for chest pain.  Gastrointestinal:  Negative for abdominal pain, constipation, diarrhea, nausea and vomiting.  Neurological:  Negative for dizziness, weakness and headaches.  Psychiatric/Behavioral:  Positive for depression, hallucinations, substance abuse and suicidal ideas. The patient is not nervous/anxious.    Blood pressure (!) 150/87, pulse 65, resp. rate 16, last menstrual period 05/06/2024, SpO2 98%. There is Kaitlyn [name] height  or weight on file to calculate BMI.   COGNITIVE FEATURES THAT CONTRIBUTE TO RISK:  Loss of executive function, Polarized thinking, and Thought constriction (tunnel vision)    SUICIDE RISK:   Moderate:  Frequent suicidal ideation with limited intensity, and duration, some specificity in terms of plans, Kaitlyn [name] associated intent, good self-control, limited dysphoria/symptomatology, some risk factors present, and identifiable protective factors, including available and accessible social support.  PLAN OF CARE:  Kaitlyn Kaitlyn [name] is a 48 yr old female who presented on 11/21 to [facility] with chest pain, command hallucinations, and SI in the setting of medication non-compliance and substance abuse,  she was admitted to [facility] on 11/22.  PPHx is significant for Schizoaffective Disorder, Anxiety, PTSD, and Polysubstance Abuse (Cocaine, Meth, THC, Hx of Heroin), and multiple Suicide Attempts, Self Injurious Behavior (cutting), and Multiple Psychiatric Hospitalizations (last- [facility] 04/2024).   Kaitlyn Kaitlyn [name] has a known Kaitlyn [name] of Substance Abuse and Schizoaffective Disorder.  It appears that she again began using substances once she exhausted her samples from her previous hospitalization so this is most likely Substance Induced worsening of her underlying schizoaffective disorder.  She was restarted on her Seroquel  in the ED so we will see how she responds to this.  She would benefit from Residential Rehab and appreciate [facility] assistance with this.  She would benefit from an ACT Team given her repeated hospitalizations.    Schizoaffective Disorder, Bipolar Type: -Continue Seroquel  250 mg QHS for mood stability and psychosis -Received Abilify  Maintena 400 mg on 11/10 -Continue Agitation Protocol: Haldol /Ativan /Benadryl    Polysubstance Abuse: -Residential Rehab referral  HTN: -Continue Losartan  25 mg daily   Diabetes: -Start Sliding Scale -Continue Metformin  500 mg BID   -Start Eucerin cream for Eczema   -Continue PRN's: Tylenol , Maalox, Atarax , Milk of Magnesia, Trazodone   I certify that inpatient services furnished can reasonably be expected to improve the patient's condition.   Kaitlyn GORMAN Rosser, DO 05/07/2024, 3:15 PM

## 2024-05-07 NOTE — [facility] Group Notes (Signed)
 Adult Psychoeducational Group Note  Date:  05/07/2024 Time:  9:08 PM  Group Topic/Focus:  Wrap-Up Group:   The focus of this group is to help patients review their daily goal of treatment and discuss progress on daily workbooks.  Participation Level:  Active  Participation Quality:  Appropriate  Affect:  Appropriate  Cognitive:  Alert  Insight: Appropriate  Engagement in Group:  Engaged  Modes of Intervention:  Discussion  Additional Comments:  Her goal for today focus on her medication. Her day was good.  Lang Drilling Long 05/07/2024, 9:08 PM

## 2024-05-07 NOTE — ED Provider Notes (Addendum)
 Emergency Medicine Observation Re-evaluation Note  Kaitlyn [name] is a 48 y.o. female, seen on rounds today.  Pt initially presented to the ED for housing instability, ongoing SUD/cocaine abuse, and intermittently voicing SI.   Physical Exam  BP (!) 146/73 (BP Location: Right Arm)   Pulse 60   Temp 98.2 F (36.8 C) (Oral)   Resp 18   Wt 65.8 kg   LMP 05/06/2024   SpO2 100%   BMI 24.89 kg/m  Physical Exam General: calm.  Cardiac: regular rate.  Lungs: breathing comfortably.  Psych: calm.   ED Course / MDM   I have reviewed the labs performed to date as well as medications administered while in observation.  Recent changes in the last 24 hours include ED obs, reassessment.  Plan  Current plan is for [facility] re-eval, metabolism of substances.   The patient has been placed in psychiatric observation due to the need to provide a safe environment for the patient while obtaining psychiatric consultation and evaluation, as well as ongoing medical and medication management to treat the patient's condition.       Bernard Drivers, MD 05/07/24 0703  Pt accepted to [facility], Dr Raliegh. Pt appear stable for transport/transfer-admit to [facility]      Bernard Drivers, MD 05/07/24 1112

## 2024-05-07 NOTE — Progress Notes (Signed)
   05/07/24 1800  Psych Admission Type (Psych Patients Only)  Admission Status Voluntary  Psychosocial Assessment  Patient Complaints None  Eye Contact Fair  Facial Expression Animated  Affect Appropriate to circumstance  Speech Logical/coherent  Interaction Assertive  Motor Activity Fidgety  Appearance/Hygiene Disheveled;Poor hygiene;In scrubs  Behavior Characteristics Cooperative  Mood Pleasant  Thought Process  Coherency WDL  Content WDL  Delusions None reported or observed  Judgment Poor  Confusion None  Danger to Self  Current suicidal ideation? Denies  Agreement Not to Harm Self Yes  Description of Agreement Verbal  Danger to Others  Danger to Others None reported or observed

## 2024-05-07 NOTE — Plan of Care (Signed)
   Problem: Education: Goal: Knowledge of [place] [facility] Education information/materials will improve Outcome: Progressing Goal: Emotional status will improve Outcome: Progressing Goal: Mental status will improve Outcome: Progressing Goal: Verbalization of understanding the information provided will improve Outcome: Progressing

## 2024-05-07 NOTE — Tx Team (Signed)
 Initial Treatment Plan 05/07/2024 5:50 PM Kaitlyn [name] FMW:[id number]    PATIENT STRESSORS: Financial difficulties   Health problems   Medication change or noncompliance   Substance abuse   Traumatic event     PATIENT STRENGTHS: Capable of independent living  Communication skills    PATIENT IDENTIFIED PROBLEMS: HOUSING: I don't have a place to go.    TRANSPORTATION: I don't have a way to get around.                 DISCHARGE CRITERIA:  Ability to meet basic life and health needs Adequate post-discharge living arrangements Improved stabilization in mood, thinking, and/or behavior Motivation to continue treatment in a less acute level of care Reduction of life-threatening or endangering symptoms to within safe limits Verbal commitment to aftercare and medication compliance  PRELIMINARY DISCHARGE PLAN: Attend aftercare/continuing care group Placement in alternative living arrangements  PATIENT/FAMILY INVOLVEMENT: This treatment plan has been presented to and reviewed with the patient, Kaitlyn [name].  The patient have been given the opportunity to ask questions and make suggestions.  Mercie VEAR Banana, RN 05/07/2024, 5:50 PM

## 2024-05-07 NOTE — Progress Notes (Signed)
   05/07/24 2200  Psych Admission Type (Psych Patients Only)  Admission Status Voluntary  Psychosocial Assessment  Patient Complaints None  Eye Contact Fair  Facial Expression Anxious  Affect Appropriate to circumstance  Speech Logical/coherent  Interaction Assertive  Motor Activity Other (Comment) (pt in wheelchair)  Appearance/Hygiene Improved  Behavior Characteristics Cooperative;Appropriate to situation  Mood Pleasant  Thought Process  Coherency WDL  Content WDL  Delusions None reported or observed  Perception WDL  Hallucination None reported or observed  Judgment Poor  Confusion None  Danger to Self  Current suicidal ideation? Denies

## 2024-05-07 NOTE — Progress Notes (Signed)
 [facility]/BMU LCSW Progress Note   05/07/2024    10:17 AM  Kaitlyn [name]   [id number]   Type of Contact and Topic:  Psychiatric Bed Placement   Pt accepted to [name]    Patient meets inpatient criteria per Adriana Pontes, NP   The attending provider will be Dr. Raliegh   Call report to [phone]    Berwyn Acosta, RN @ [facility] ED notified.     Pt scheduled  to arrive at [facility] TODAY.    Bunnie Gallop, MSW, LCSW-A  10:18 AM 05/07/2024

## 2024-05-07 NOTE — Progress Notes (Signed)
 05/07/2024 Called [facility] [phone]. Report given to [facility].

## 2024-05-07 NOTE — Consult Note (Signed)
 [facility] Consult Note  Patient Name: Kaitlyn [name] MRN: [id number] DOB: 21-Dec-1975 DATE OF Consult: 05/07/2024  PRIMARY PSYCHIATRIC DIAGNOSES   1.  Schizoaffective Disorder, Bipolar Type 2.  Cocaine Use Disorder 3.  Suicidal ideation   RECOMMENDATIONS  Recommendations: Medication recommendations: Continue Seroquel , 50 mg bid and 200 mg at bedtime for psychosis/mood control; Cogentin , 1 mg bid for EPS;  PRN's:  hydroxyzine , 50 mg q6h PRN anxiety;  Agree with your current Risperdal /Geodon /Ativan  PRN's for agitation Non-Medication/therapeutic recommendations: Patient continues with command hallucinations, so will continue to need close observation per ED protocol until can be safely admitted to Psychiatry.  Continue matter-of-fact emotional support in ED, pending transfer Is inpatient psychiatric hospitalization recommended for this patient? Yes (Explain why): Patient again presents with command hallucinations to kill herself.  Meets inpatient admission criteria.  Willing to come in voluntarily. Is another care setting recommended for this patient? (examples may include Crisis Stabilization Unit, Residential/Recovery Treatment, ALF/SNF, Memory Care Unit)  No (Explain why): As above From a psychiatric perspective, is this patient appropriate for discharge to an outpatient setting/resource or other less restrictive environment for continued care?  No (Explain why): As above Follow-Up Telepsychiatry C/L services: We will sign off for now. Please re-consult our service if needed for any concerning changes in the patient's condition, discharge planning, or questions. Communication: Treatment team members (and family members if applicable) who were involved in treatment/care discussions and planning, and with whom we spoke or engaged with via secure text/chat, include the following: Sent secure message to Dr. Griselda, ED attending, and ED staff, outlining recommenations  Thank you for  involving us  in the care of this patient. If you have any additional questions or concerns, please call [phone] and ask for the provider on-call.   TELEPSYCHIATRY ATTESTATION & CONSENT   As the provider for this telehealth consult, I attest that I verified the patient's identity using two separate identifiers, introduced myself to the patient, provided my credentials, disclosed my location, and performed this encounter via a HIPAA-compliant, real-time, face-to-face, two-way, interactive audio and video platform and with the full consent and agreement of the patient (or guardian as applicable.)   Patient physical location: ED, [facility] Telehealth provider physical location: home office in state of [place] .  Video start time: 0245h EST  Video end time: 0300h EST   Total time spent in this encounter was 30 minutes, including record review, clinical interview, behavior observations, discussion of impressions and recommendations (including medications and hospitalization), and consultation/communication with relevant parties   IDENTIFYING DATA  Kaitlyn [name] is a 48 y.o. year-old female for whom a psychiatric consultation has been ordered by the primary provider. The patient was identified using two separate identifiers.  CHIEF COMPLAINT/REASON FOR CONSULT   The voices are telling to kill myself again.  They're trying to act like people I know   HISTORY OF PRESENT ILLNESS (HPI)  The patient presents with longstanding history of schizoaffective disorder, with multiple hospitalizations since she was age 27.  Has had multiple suicide attempts as well, most recently via cutting, prior to most recent hospitalization, which ended on November 15.  Patient reports that she has no way to get to the pharmacy or to outpatient care, so she tends to run out of take-home supplies of meds from hospitalizations, and she rapidly deteriorates.  Did get Abilify  Maintenna at the last  hospitalization, and sent out on Seroquel  and Cogentin .  However, again, once her take-home supply ran out, she began  using cocaine to manage sx's.  Soon she was again having command hallucinations, likely as a combo of her schizoaffective disorder, in combination with the cocaine use (also some meth and cannabis use).  SABRABAL negative.  UDS pending.  No homicidal ideation.   PAST PSYCHIATRIC HISTORY  As above Otherwise as per HPI above.  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) 07/19/2017   Hypertension    Hypertensive emergency 05/31/2022   Ischemic stroke (HCC) 01/01/2020   subacute right middle cerebellar peduncle and pons infarction   Left-sided weakness 01/27/2022   Head CT with remote right occipital infarct which is consistent with old left-sided weakness   MDD (major depressive disorder), recurrent severe, 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   Normocytic anemia 02/09/2020   Opiate use    History of Suboxone  Therapy until 05/2021   Polysubstance abuse (HCC) 07/19/2017   Prescription drug misadventures (Seroquel )      HOME MEDICATIONS  Facility Ordered Medications  Medication   metoCLOPramide  (REGLAN ) injection 10 mg   LORazepam  (ATIVAN ) injection 1 mg   risperiDONE  (RISPERDAL  M-TABS) disintegrating tablet 2 mg   And   LORazepam  (ATIVAN ) tablet 1 mg   And   ziprasidone  (GEODON ) injection 20 mg   acetaminophen  (TYLENOL ) tablet 650 mg    QUEtiapine  (SEROQUEL ) tablet 50 mg   And   QUEtiapine  (SEROQUEL ) tablet 200 mg   benztropine  (COGENTIN ) tablet 1 mg   hydrOXYzine  (ATARAX ) tablet 50 mg   PTA Medications  Medication Sig   hydrOXYzine  (ATARAX ) 50 MG tablet Take 1 tablet (50 mg total) by mouth 3 (three) times daily as needed for anxiety.   losartan  (COZAAR ) 25 MG tablet Take 1 tablet (25 mg total) by mouth daily. For hypertension.   metFORMIN  (GLUCOPHAGE ) 500 MG tablet Take 1 tablet (500 mg total) by mouth 2 (two) times daily with a meal. For diabetes management. (Patient not taking: Reported on 04/25/2024)   QUEtiapine  (SEROQUEL ) 200 MG tablet Take 1 tablet (200 mg total) by mouth at bedtime. For mood control   QUEtiapine  (SEROQUEL ) 50 MG tablet Take 1 tablet (50 mg total) by mouth 2 (two) times daily. For agitation.   [START ON 05/23/2024] ARIPiprazole  ER (ABILIFY  MAINTENA) 400 MG SRER injection Inject 2 mLs (400 mg total) into the muscle every 28 (twenty-eight) days.   benztropine  (COGENTIN ) 1 MG tablet Take 1 tablet (1 mg total) by mouth 2 (two) times daily.   metFORMIN  (GLUCOPHAGE ) 500 MG tablet Take 1 tablet (500 mg total) by mouth 2 (two) times daily with a meal.     ALLERGIES  Allergies  Allergen Reactions   Tomato Anaphylaxis and Other (See Comments)    Pt reports this as an allergy, but has been eating ketchup and pizza on previous visits w/o any s/s of allergic reaction/anaphylaxis.    Hydrocodone  Itching   Latex Itching and Rash    SOCIAL & SUBSTANCE USE HISTORY  Social History   Socioeconomic History  Marital status: Single    Spouse name: Not on file   Number of children: Not on file   Years of education: Not on file   Highest education level: Not on file  Occupational History   Not on file  Tobacco Use   Smoking status: Every Day    Current packs/day: 1.00    Average packs/day: 1 pack/day for 25.9 years (25.9 ttl pk-yrs)    Types: Cigarettes    Start date: 06/16/1998    Passive exposure: Current    Smokeless tobacco: Former  Building Services Engineer status: Every Day  Substance and Sexual Activity   Alcohol use: Not Currently   Drug use: Yes    Types: Marijuana, Cocaine, Crack cocaine    Comment: occassionally   Sexual activity: Never    Birth control/protection: None  Other Topics Concern   Not on file  Social History Narrative   ** Merged History Encounter **       Social Drivers of Health   Financial Resource Strain: Not on file  Food Insecurity: Food Insecurity Present (04/26/2024)   Hunger Vital Sign    Worried About Running Out of Food in the Last Year: Often true    Ran Out of Food in the Last Year: Often true  Transportation Needs: Unmet Transportation Needs (03/03/2024)   PRAPARE - Administrator, Civil Service (Medical): Yes    Lack of Transportation (Non-Medical): Yes  Physical Activity: Not on file  Stress: Not on file  Social Connections: Not on file   Social History   Tobacco Use  Smoking Status Every Day   Current packs/day: 1.00   Average packs/day: 1 pack/day for 25.9 years (25.9 ttl pk-yrs)   Types: Cigarettes   Start date: 06/16/1998   Passive exposure: Current  Smokeless Tobacco Former   Social History   Substance and Sexual Activity  Alcohol Use Not Currently   Social History   Substance and Sexual Activity  Drug Use Yes   Types: Marijuana, Cocaine, Crack cocaine   Comment: occassionally    Additional pertinent information Lives by herself, on social security disability.  FAMILY HISTORY  Family History  Problem Relation Age of Onset   Hypertension Mother    CAD Mother 17       died of MI at age 47   Hypertension Father    Family Psychiatric History (if known):  Did not report   MENTAL STATUS EXAM (MSE)  Mental Status Exam: General Appearance: Fairly Groomed  Orientation:  Full (Time, Place, and Person)  Memory:  Immediate;   Fair Recent;   Fair Remote;   Fair  Concentration:  Concentration: Fair and  Attention Span: Fair  Recall:  Fair  Attention  Fair  Eye Contact:  Fair  Speech:  Normal Rate  Language:  Good  Volume:  Decreased  Mood: I'm not doing OK.  Affect:  Depressed and Flat  Thought Process:  Descriptions of Associations: Tangential  Thought Content:  Hallucinations: Command:  auditory, Paranoid Ideation, Rumination, and Tangential  Suicidal Thoughts:  Yes.  with intent/plan  Homicidal Thoughts:  No  Judgement:  Impaired  Insight:  Shallow  Psychomotor Activity:  Restlessness  Akathisia:  Negative  Fund of Knowledge:  Fair    Assets:  Communication Skills Desire for Improvement  Cognition:  WNL  ADL's:  Intact  AIMS (if indicated):       VITALS  Blood pressure (!) 141/88, pulse 66, temperature 97.7 F (36.5 C),  temperature source Oral, resp. rate 20, weight 65.8 kg, last menstrual period 05/06/2024, SpO2 98%.  LABS  Admission on 05/06/2024  Component Date Value Ref Range Status   Sodium 05/06/2024 142  135 - 145 mmol/L Final   Potassium 05/06/2024 3.3 (L)  3.5 - 5.1 mmol/L Final   Chloride 05/06/2024 106  98 - 111 mmol/L Final   CO2 05/06/2024 28  22 - 32 mmol/L Final   Glucose, Bld 05/06/2024 129 (H)  70 - 99 mg/dL Final   Glucose reference range applies only to samples taken after fasting for at least 8 hours.   BUN 05/06/2024 11  6 - 20 mg/dL Final   Creatinine, Ser 05/06/2024 0.96  0.44 - 1.00 mg/dL Final   Calcium  05/06/2024 9.4  8.9 - 10.3 mg/dL Final   Total Protein 88/78/7974 7.5  6.5 - 8.1 g/dL Final   Albumin 88/78/7974 4.0  3.5 - 5.0 g/dL Final   AST 88/78/7974 17  15 - 41 U/L Final   ALT 05/06/2024 15  0 - 44 U/L Final   Alkaline Phosphatase 05/06/2024 75  38 - 126 U/L Final   Total Bilirubin 05/06/2024 0.4  0.0 - 1.2 mg/dL Final   GFR, Estimated 05/06/2024 >60  >60 mL/min Final   Comment: (NOTE) Calculated using the CKD-EPI Creatinine Equation (2021)    Anion gap 05/06/2024 9  5 - 15 Final   Performed at [facility], [address] [place], [place] [address]   Alcohol, Ethyl (B) 05/06/2024 <15  <15 mg/dL Final   Comment: (NOTE) For medical purposes only. Performed at [facility], [address] [place], [place] [address]    WBC 05/06/2024 5.3  4.0 - 10.5 K/uL Final   RBC 05/06/2024 4.13  3.87 - 5.11 MIL/uL Final   Hemoglobin 05/06/2024 9.9 (L)  12.0 - 15.0 g/dL Final   HCT 88/78/7974 32.9 (L)  36.0 - 46.0 % Final   MCV 05/06/2024 79.7 (L)  80.0 - 100.0 fL Final   MCH 05/06/2024 24.0 (L)  26.0 - 34.0 pg Final   MCHC 05/06/2024 30.1  30.0 - 36.0 g/dL Final   RDW 88/78/7974 20.7 (H)  11.5 - 15.5 % Final   Platelets 05/06/2024 390  150 - 400 K/uL Final   nRBC 05/06/2024 0.0  0.0 - 0.2 % Final   Neutrophils Relative % 05/06/2024 42  % Final   Neutro Abs 05/06/2024 2.3  1.7 - 7.7 K/uL Final   Lymphocytes Relative 05/06/2024 34  % Final   Lymphs Abs 05/06/2024 1.8  0.7 - 4.0 K/uL Final   Monocytes Relative 05/06/2024 7  % Final   Monocytes Absolute 05/06/2024 0.4  0.1 - 1.0 K/uL Final   Eosinophils Relative 05/06/2024 16  % Final   Eosinophils Absolute 05/06/2024 0.9 (H)  0.0 - 0.5 K/uL Final   Basophils Relative 05/06/2024 1  % Final   Basophils Absolute 05/06/2024 0.0  0.0 - 0.1 K/uL Final   Immature Granulocytes 05/06/2024 0  % Final   Abs Immature Granulocytes 05/06/2024 0.01  0.00 - 0.07 K/uL Final   Performed at [facility], [address] [place], [place] [address]   hCG, Beta Chain, Quant, GORMAN 05/06/2024 <1  <5 mIU/mL Final   Comment:          GEST. AGE      CONC.  (mIU/mL)   <=1 WEEK        5 - 50  2 WEEKS       50 - 500     3 WEEKS       100 - 10,000     4 WEEKS     1,000 - 30,000     5 WEEKS     3,500 - 115,000   6-8 WEEKS     12,000 - 270,000    12 WEEKS     15,000 - 220,000        FEMALE AND NON-PREGNANT FEMALE:     LESS THAN 5 mIU/mL Performed at [facility], [address] [place], [place] [address]    Troponin T High  Sensitivity 05/06/2024 <15  0 - 19 ng/L Final   Comment: (NOTE) Biotin concentrations > 1000 ng/mL falsely decrease TnT results.  Serial cardiac troponin measurements are suggested.  Refer to the Links section for chest pain algorithms and additional  guidance. Performed at [facility], [address], [place], [place] [address]    SARS Coronavirus 2 by RT PCR 05/06/2024 NEGATIVE  NEGATIVE Final   Comment: (NOTE) SARS-CoV-2 target nucleic acids are NOT DETECTED.  The SARS-CoV-2 RNA is generally detectable in upper respiratory specimens during the acute phase of infection. The lowest concentration of SARS-CoV-2 viral copies this assay can detect is 138 copies/mL. A negative result does not preclude SARS-Cov-2 infection and should not be used as the sole basis for treatment or other patient management decisions. A negative result may occur with  improper specimen collection/handling, submission of specimen other than nasopharyngeal swab, presence of viral mutation(s) within the areas targeted by this assay, and inadequate number of viral copies(<138 copies/mL). A negative result must be combined with clinical observations, patient history, and epidemiological information. The expected result is Negative.  Fact Sheet for Patients:  bloggercourse.com  Fact Sheet for Healthcare Providers:  seriousbroker.it  This test is no                          t yet approved or cleared by the [place]  FDA and  has been authorized for detection and/or diagnosis of SARS-CoV-2 by FDA under an Emergency Use Authorization (EUA). This EUA will remain  in effect (meaning this test can be used) for the duration of the COVID-19 declaration under Section 564(b)(1) of the Act, 21 U.S.C.section 360bbb-3(b)(1), unless the authorization is terminated  or revoked sooner.       Influenza A by PCR 05/06/2024 NEGATIVE  NEGATIVE Final    Influenza B by PCR 05/06/2024 NEGATIVE  NEGATIVE Final   Comment: (NOTE) The Xpert Xpress SARS-CoV-2/FLU/RSV plus assay is intended as an aid in the diagnosis of influenza from Nasopharyngeal swab specimens and should not be used as a sole basis for treatment. Nasal washings and aspirates are unacceptable for Xpert Xpress SARS-CoV-2/FLU/RSV testing.  Fact Sheet for Patients: bloggercourse.com  Fact Sheet for Healthcare Providers: seriousbroker.it  This test is not yet approved or cleared by the [place]  FDA and has been authorized for detection and/or diagnosis of SARS-CoV-2 by FDA under an Emergency Use Authorization (EUA). This EUA will remain in effect (meaning this test can be used) for the duration of the COVID-19 declaration under Section 564(b)(1) of the Act, 21 U.S.C. section 360bbb-3(b)(1), unless the authorization is terminated or revoked.     Resp Syncytial Virus by PCR 05/06/2024 NEGATIVE  NEGATIVE Final   Comment: (NOTE) Fact Sheet for Patients: bloggercourse.com  Fact Sheet for Healthcare Providers: seriousbroker.it  This test is not yet approved or cleared by the [place]  FDA and has been authorized for detection and/or diagnosis of SARS-CoV-2 by FDA under an Emergency Use Authorization (EUA). This EUA will remain in effect (meaning this test can be used) for the duration of the COVID-19 declaration under Section 564(b)(1) of the Act, 21 U.S.C. section 360bbb-3(b)(1), unless the authorization is terminated or revoked.  Performed at [facility], [address], [place], [place] [address]     PSYCHIATRIC REVIEW OF SYSTEMS (ROS)  ROS: Notable for the following relevant positive findings: Review of Systems  Constitutional: Negative.   HENT: Negative.    Eyes: Negative.   Respiratory: Negative.    Cardiovascular: Negative.    Gastrointestinal: Negative.   Genitourinary: Negative.   Musculoskeletal: Negative.   Skin: Negative.   Neurological: Negative.   Endo/Heme/Allergies: Negative.   Psychiatric/Behavioral:  Positive for depression, hallucinations, substance abuse and suicidal ideas. The patient is nervous/anxious.     Additional findings:      Musculoskeletal: No abnormal movements observed      Gait & Station: Laying/Sitting      Pain Screening: Denies      Nutrition & Dental Concerns:  Reviewed   RISK FORMULATION/ASSESSMENT  Is the patient experiencing any suicidal or homicidal ideations: Yes       Explain if yes: Patient continues with command hallucinations to kill herself  Protective factors considered for safety management:   Patient without local support, and community resources limited.  Risk factors/concerns considered for safety management:   Prior attempt Depression Substance abuse/dependence Access to lethal means Hopelessness Impulsivity Isolation Barriers to accessing treatment Unmarried  Is there a safety management plan with the patient and treatment team to minimize risk factors and promote protective factors: No           Explain: As above, patient without significant community resources  Is crisis care placement or psychiatric hospitalization recommended: Yes     Based on my current evaluation and risk assessment, patient is determined at this time to be at:  High risk  *RISK ASSESSMENT Risk assessment is a dynamic process; it is possible that this patient's condition, and risk level, may change. This should be re-evaluated and managed over time as appropriate. Please re-consult psychiatric consult services if additional assistance is needed in terms of risk assessment and management. If your team decides to discharge this patient, please advise the patient how to best access emergency psychiatric services, or to call 911, if their condition worsens or they feel unsafe  in any way.   Adriana JINNY Pontes, MD Telepsychiatry Consult Services

## 2024-05-08 LAB — GLUCOSE, CAPILLARY
Glucose-Capillary: 129 mg/dL — ABNORMAL HIGH (ref 70–99)
Glucose-Capillary: 104 mg/dL — ABNORMAL HIGH (ref 70–99)
Glucose-Capillary: 104 mg/dL — ABNORMAL HIGH (ref 70–99)
Glucose-Capillary: 126 mg/dL — ABNORMAL HIGH (ref 70–99)

## 2024-05-08 MED ORDER — LOSARTAN POTASSIUM 50 MG PO TABS
50.0000 mg | ORAL_TABLET | Freq: Every day | ORAL | Status: DC
Start: 2024-05-09 — End: 2024-05-12
  Administered 2024-05-09 – 2024-05-12 (×4): 50 mg via ORAL
  Filled 2024-05-08 (×4): qty 1

## 2024-05-08 MED ORDER — ENSURE PLUS HIGH PROTEIN PO LIQD
237.0000 mL | Freq: Two times a day (BID) | ORAL | Status: DC
  Administered 2024-05-08 – 2024-05-12 (×9): 237 mL via ORAL
  Filled 2024-05-08 (×11): qty 237

## 2024-05-08 MED ORDER — QUETIAPINE FUMARATE 300 MG PO TABS
300.0000 mg | ORAL_TABLET | Freq: Every day | ORAL | Status: DC
  Administered 2024-05-08 – 2024-05-11 (×4): 300 mg via ORAL
  Filled 2024-05-08 (×4): qty 1

## 2024-05-08 NOTE — Progress Notes (Signed)
 [facility] MD Progress Note  05/08/2024 10:11 AM DAILYNN NANCARROW  MRN:  [id number] Subjective:   Kaitlyn Gray is a 48 yr old female who presented on 11/21 to [facility] with chest pain, command hallucinations, and SI in the setting of medication non-compliance and substance abuse, she was admitted to [facility] on 11/22. PPHx is significant for Schizoaffective Disorder, Anxiety, PTSD, and Polysubstance Abuse (Cocaine, Meth, THC, Hx of Heroin), and multiple Suicide Attempts, Self Injurious Behavior (cutting), and Multiple Psychiatric Hospitalizations (last- [facility] 04/2024).    Case was discussed in the multidisciplinary team. MAR was reviewed and patient was compliant with medications.  She received PRN Tylenol  and Hydroxyzine  last night.   Psychiatric Team made the following recommendations yesterday: -Continue Seroquel  250 mg QHS for mood stability and psychosis -Received Abilify  Maintena 400 mg on 11/10   On interview today patient reports she slept fair last night.  She reports her appetite is doing good.  She reports no SI or HI.  She reports she continues to have AH and VH but that they are improving.  She reports no Paranoia or Ideas of Reference.  She reports no issues with her medications.  Discussed with her that we would further increase her Seroquel  to address her hallucinations and she was agreeable.  She requests Ensure and discussed that this could be started.  She reports no other concerns at present.   Principal Problem: Schizoaffective disorder, bipolar type (HCC) Diagnosis: Principal Problem:   Schizoaffective disorder, bipolar type (HCC) Active Problems:   Polysubstance abuse (HCC)  Total Time spent with patient:  I personally spent 35 minutes on the unit in direct patient care. The direct patient care time included face-to-face time with the patient, reviewing the patient's chart, communicating with other professionals, and coordinating care.    Past Psychiatric History:   Schizoaffective Disorder, Anxiety, PTSD, and Polysubstance Abuse (Cocaine, Meth, THC, Hx of Heroin), and multiple Suicide Attempts, Self Injurious Behavior (cutting), and Multiple Psychiatric Hospitalizations (last- [facility] 04/2024).   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) 07/19/2017   Hypertension    Hypertensive emergency 05/31/2022   Ischemic stroke (HCC) 01/01/2020   subacute right middle cerebellar peduncle and pons infarction   Left-sided weakness 01/27/2022   Head CT with remote right occipital infarct which is consistent with old left-sided weakness   MDD (major depressive disorder), recurrent severe, 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   Normocytic anemia 02/09/2020   Opiate use    History of Suboxone  Therapy until 05/2021   Polysubstance abuse (HCC) 07/19/2017   Prescription drug misadventures (Seroquel )     Past Surgical History:  Procedure Laterality Date   CESAREAN SECTION     Family History:  Family History  Problem Relation Age of Onset   Hypertension Mother    CAD Mother 50       died of MI at age 43   Hypertension Father    Family Psychiatric  History:  Mother, Wylie,  Uncle- Substance Abuse No Known Diagnosis' or Suicides  Social History:  Social History   Substance and Sexual Activity  Alcohol Use Not Currently     Social History   Substance and Sexual Activity  Drug Use Yes   Types: Marijuana, Cocaine, Crack  cocaine   Comment: occassionally    Social History   Socioeconomic History   Marital status: Single    Spouse name: Not on file   Number of children: Not on file   Years of education: Not on file   Highest education level: Not on file  Occupational History   Not on file  Tobacco Use   Smoking status: Every Day    Current packs/day: 1.00    Average packs/day: 1 pack/day for 25.9 years (25.9 ttl pk-yrs)    Types: Cigarettes    Start date: 06/16/1998    Passive exposure: Current   Smokeless tobacco: Former  Building Services Engineer status: Every Day  Substance and Sexual Activity   Alcohol use: Not Currently   Drug use: Yes    Types: Marijuana, Cocaine, Crack cocaine    Comment: occassionally   Sexual activity: Never    Birth control/protection: None  Other Topics Concern   Not on file  Social History Narrative   ** Merged History Encounter **       Social Drivers of Health   Financial Resource Strain: Not on file  Food Insecurity: Food Insecurity Present (05/07/2024)   Hunger Vital Sign    Worried About Running Out of Food in the Last Year: Often true    Ran Out of Food in the Last Year: Often true  Transportation Needs: Unmet Transportation Needs (05/07/2024)   PRAPARE - Administrator, Civil Service (Medical): Yes    Lack of Transportation (Non-Medical): Yes  Physical Activity: Not on file  Stress: Not on file  Social Connections: Not on file   Additional Social History:                         Sleep: Fair Estimated Sleeping Duration (Last 24 Hours): 7.75-10.00 hours (Due to [employer] Time, the durations displayed may not accurately represent documentation during the time change interval)  Appetite:  Good  Current Medications: Current Facility-Administered Medications  Medication Dose Route Frequency Provider Last Rate Last Admin   acetaminophen  (TYLENOL ) tablet 650 mg  650 mg Oral Q6H PRN Motley-Mangrum, Jadeka A, PMHNP   650 mg at  05/07/24 1550   alum & mag hydroxide-simeth (MAALOX/MYLANTA) 200-200-20 MG/5ML suspension 30 mL  30 mL Oral Q4H PRN Motley-Mangrum, Jadeka A, PMHNP       benztropine  (COGENTIN ) tablet 1 mg  1 mg Oral BID Motley-Mangrum, Jadeka A, PMHNP       haloperidol  (HALDOL ) tablet 5 mg  5 mg Oral TID PRN Motley-Mangrum, Jadeka A, PMHNP       And   diphenhydrAMINE  (BENADRYL ) capsule 50 mg  50 mg Oral TID PRN Motley-Mangrum, Jadeka A, PMHNP       haloperidol  lactate (HALDOL ) injection 5 mg  5 mg Intramuscular TID PRN Motley-Mangrum, Jadeka A, PMHNP       And   diphenhydrAMINE  (BENADRYL ) injection 50 mg  50 mg Intramuscular TID PRN Motley-Mangrum, Jadeka A, PMHNP       And   LORazepam  (ATIVAN ) injection 2 mg  2 mg Intramuscular TID PRN Motley-Mangrum, Jadeka A, PMHNP       haloperidol  lactate (HALDOL ) injection 10 mg  10 mg Intramuscular TID PRN Motley-Mangrum, Jadeka A, PMHNP       And   diphenhydrAMINE  (BENADRYL ) injection 50 mg  50 mg Intramuscular TID PRN Motley-Mangrum, Jadeka A, PMHNP       And   LORazepam  (ATIVAN ) injection 2 mg  2 mg Intramuscular TID PRN Motley-Mangrum, Jadeka A, PMHNP       feeding supplement (ENSURE PLUS HIGH PROTEIN) liquid 237 mL  237 mL Oral BID BM Ehtan Delfavero S, DO   237 mL at 05/08/24 1000   hydrocerin (EUCERIN) cream   Topical BID Kurstyn Larios S, DO   Given at 05/08/24 9040   hydrOXYzine  (ATARAX ) tablet 50 mg  50 mg Oral Q6H PRN Motley-Mangrum, Jadeka A, PMHNP   50 mg at 05/07/24 2050   insulin  aspart (novoLOG ) injection 0-15 Units  0-15 Units Subcutaneous TID WC Amalee Olsen S, DO   2 Units at 05/08/24 9358   insulin  aspart (novoLOG ) injection 0-5 Units  0-5 Units Subcutaneous QHS Lakrisha Iseman S, DO       losartan  (COZAAR ) tablet 25 mg  25 mg Oral Daily Motley-Mangrum, Jadeka A, PMHNP   25 mg at 05/08/24 9040   magnesium  hydroxide (MILK OF MAGNESIA) suspension 30 mL  30 mL Oral Daily PRN Motley-Mangrum, Jadeka A, PMHNP       metFORMIN   (GLUCOPHAGE ) tablet 500 mg  500 mg Oral BID WC Motley-Mangrum, Jadeka A, PMHNP   500 mg at 05/08/24 9040   QUEtiapine  (SEROQUEL ) tablet 300 mg  300 mg Oral QHS Rosana Farnell S, DO        Lab Results:  Results for orders placed or performed during the hospital encounter of 05/07/24 (from the past 48 hours)  Glucose, capillary     Status: Abnormal   Collection Time: 05/07/24  8:24 PM  Result Value Ref Range   Glucose-Capillary 118 (H) 70 - 99 mg/dL    Comment: Glucose reference range applies only to samples taken after fasting for at least 8 hours.   Comment 1 Notify RN    Comment 2 Document in Chart   Glucose, capillary     Status: Abnormal   Collection Time: 05/08/24  5:53 AM  Result Value Ref Range   Glucose-Capillary 129 (H) 70 - 99 mg/dL    Comment: Glucose reference range applies only to samples taken after fasting for at least 8 hours.    Blood Alcohol level:  Lab Results  Component Value Date   [facility] <15 05/06/2024   ETH <15 04/24/2024    Metabolic Disorder Labs: Lab Results  Component Value Date   HGBA1C 5.9 (H) 04/27/2024   MPG 122.63 04/27/2024   MPG 108.28 10/23/2023   Lab Results  Component Value Date   PROLACTIN 20.7 12/23/2022   Lab Results  Component Value Date   CHOL 154 04/27/2024   TRIG 53 04/27/2024   HDL 59 04/27/2024   CHOLHDL 2.6 04/27/2024   VLDL 11 04/27/2024   LDLCALC 85 04/27/2024   LDLCALC 64 10/23/2023    Physical Findings: AIMS:  ,  ,  ,  ,  ,  ,   CIWA:    COWS:     Musculoskeletal: Strength & Muscle Tone: within normal limits Gait & Station: normal Patient leans: N/A  Psychiatric Specialty Exam:  Presentation  General Appearance:  Casual  Eye Contact: Fair  Speech: Clear and Coherent; Slow  Speech Volume: Normal  Handedness: Right   Mood and Affect  Mood: Dysphoric  Affect: Flat  Thought Process  Thought Processes: Linear  Descriptions of Associations:Intact  Orientation:Full (Time, Place  and Person)  Thought Content:Delusions  History of Schizophrenia/Schizoaffective disorder:Yes  Duration of Psychotic Symptoms:Less than six months  Hallucinations:Hallucinations: Auditory; Visual (improving)  Ideas of Reference:None  Suicidal Thoughts:Suicidal Thoughts: No  Homicidal Thoughts:Homicidal Thoughts: No   Sensorium  Memory: Immediate Fair  Judgment: Intact  Insight: Present   Executive Functions  Concentration: Fair  Attention Span: Fair  Recall: Fair  Fund of Knowledge: Fair  Language: Good   Psychomotor Activity  Psychomotor Activity: Psychomotor Activity: Normal   Assets  Assets: Resilience   Sleep  Sleep: Sleep: Fair    Physical Exam: Physical Exam Vitals and nursing note reviewed.  Constitutional:      General: She is not in acute distress.    Appearance: Normal appearance. She is normal weight. She is not ill-appearing or toxic-appearing.  HENT:     Head: Normocephalic and atraumatic.  Pulmonary:     Effort: Pulmonary effort is normal.  Neurological:     Mental Status: She is alert.    Review of Systems  Respiratory:  Negative for cough and shortness of breath.   Cardiovascular:  Negative for chest pain.  Gastrointestinal:  Negative for abdominal pain, constipation, diarrhea, nausea and vomiting.  Neurological:  Negative for dizziness, weakness and headaches.  Psychiatric/Behavioral:  Positive for depression and hallucinations (AVH- improving). Negative for suicidal ideas. The patient is not nervous/anxious.    Blood pressure 139/75, pulse (!) 59, temperature (!) 97.4 F (36.3 C), resp. rate 18, height 5' 4 (1.626 m), weight 66 kg, last menstrual period 05/06/2024, SpO2 100%. Body mass index is 24.98 kg/m.   Treatment Plan Summary: Daily contact with patient to assess and evaluate symptoms and progress in treatment and Medication management  Margaret Cockerill is a 48 yr old female who presented on 11/21 to  [facility] with chest pain, command hallucinations, and SI in the setting of medication non-compliance and substance abuse,  she was admitted to [facility] on 11/22.  PPHx is significant for Schizoaffective Disorder, Anxiety, PTSD, and Polysubstance Abuse (Cocaine, Meth, THC, Hx of Heroin), and multiple Suicide Attempts, Self Injurious Behavior (cutting), and Multiple Psychiatric Hospitalizations (last- [facility] 04/2024).     Demari is reporting some improvement in her hallucinations probably a combination of restarting her medications and clearance of Crack cocaine but they are still present so we will further increase her Seroquel .  We will start Ensure.  We will continue to monitor.      Schizoaffective Disorder, Bipolar Type: -Increase Seroquel  to 300 mg QHS for mood stability and psychosis -Received Abilify  Maintena 400 mg on 11/10 -Continue Agitation Protocol: Haldol /Ativan /Benadryl      Polysubstance Abuse: -Residential Rehab referral      HTN: -Continue Losartan  25 mg daily     Diabetes: -Continue Sliding Scale -Continue Metformin  500 mg BID     -Start Ensure 237 mL BID -Continue Eucerin cream for Eczema  -Continue PRN's: Tylenol , Maalox, Atarax , Milk of Magnesia, Trazodone     --  The risks/benefits/side-effects/alternatives to medications were discussed in detail with the patient and time was given for questions. The patient consents to medication trials.                -- Metabolic profile and EKG monitoring obtained while on an atypical antipsychotic (BMI: 24.98 Lipid Panel: WNL, HbgA1c: 5.9, EKG: Sinus Rhythm w/ marked sinus Arrhythmia w/QTc: 426)              --  Encouraged patient to participate in unit milieu and in scheduled group therapies              -- Short Term Goals: Ability to identify changes in lifestyle to reduce recurrence of condition will improve, Ability to verbalize feelings will improve, Ability to disclose and discuss suicidal ideas, Ability to demonstrate  self-control will improve, Ability to identify and develop effective coping behaviors will improve, Ability to maintain clinical measurements within normal limits will improve, Compliance with prescribed medications will improve, and Ability to identify triggers associated with substance abuse/mental health issues will improve             -- Long Term Goals: Improvement in symptoms so as ready for discharge   Safety and Monitoring:             -- Voluntary admission to inpatient psychiatric unit for safety, stabilization and treatment             -- Daily contact with patient to assess and evaluate symptoms and progress in treatment             -- Patient's case to be discussed in multi-disciplinary team meeting             -- Observation Level : q15 minute checks             -- Vital signs:  q12 hours             -- Precautions: suicide, elopement, and assault  Discharge Planning:              -- Social work and case management to assist with discharge planning and identification of hospital follow-up needs prior to discharge             -- Estimated LOS: 4-6 more days             -- Discharge Concerns: Need to establish a safety plan; Medication compliance and effectiveness             -- Discharge Goals: Return home with outpatient referrals for mental health follow-up including medication management/psychotherapy   Marsa GORMAN Rosser, DO 05/08/2024, 10:11 AM

## 2024-05-08 NOTE — Progress Notes (Signed)
 Patient complaining of AH when she goes in her bedroom. MHT reports patient making loud noises in her room. PRN PO haldol  5mg  and benadryl  50mg  administered from the mild agitation protocol.

## 2024-05-08 NOTE — Group Note (Signed)
 Date:  05/08/2024 Time:  10:22 AM  Group Topic/Focus:  Emotional Wellness: Focused on developing emotional awareness and intelligence by exploring the Feelings Wheel and reflecting on insights from a TEDx talk about emotional well-being. Our aim was to deepen our understanding of emotions, improve our ability to identify and express feelings, and explore practical strategies for managing emotions in a healthy way.  Participation Level:  Did Not Attend  Kaitlyn Gray 05/08/2024, 10:22 AM

## 2024-05-08 NOTE — Plan of Care (Signed)
   Problem: Education: Goal: Emotional status will improve Outcome: Not Progressing Goal: Mental status will improve Outcome: Not Progressing   Problem: Activity: Goal: Interest or engagement in activities will improve Outcome: Not Progressing

## 2024-05-08 NOTE — Group Note (Signed)
 Date:  05/08/2024 Time:  8:36 PM  Group Topic/Focus:  Addiction - Ted Talk on Addiction/[place]    Participation Level:  Did Not Attend Kaitlyn Gray 05/08/2024, 8:36 PM
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
## 2024-05-08 NOTE — Progress Notes (Signed)
   05/08/24 1000  Psych Admission Type (Psych Patients Only)  Admission Status Voluntary  Psychosocial Assessment  Patient Complaints None  Eye Contact Fair  Facial Expression Animated  Affect Appropriate to circumstance  Speech Logical/coherent  Interaction Assertive  Motor Activity Unsteady (in wheelchair)  Appearance/Hygiene [employer];Appropriate to situation  Mood Pleasant  Thought Process  Coherency WDL  Content WDL  Delusions None reported or observed  Perception WDL  Hallucination None reported or observed  Judgment Poor  Confusion None  Danger to Self  Current suicidal ideation? Denies  Self-Injurious Behavior No self-injurious ideation or behavior indicators observed or expressed   Agreement Not to Harm Self Yes  Description of Agreement verbal  Danger to Others  Danger to Others None reported or observed

## 2024-05-08 NOTE — Plan of Care (Signed)
  Problem: Education: Goal: Knowledge of [place] [facility] Education information/materials will improve Outcome: Progressing Goal: Emotional status will improve Outcome: Progressing Goal: Mental status will improve Outcome: Progressing Goal: Verbalization of understanding the information provided will improve Outcome: Progressing   Problem: Activity: Goal: Interest or engagement in activities will improve Outcome: Progressing   Problem: Coping: Goal: Ability to demonstrate self-control will improve Outcome: Progressing   Problem: Safety: Goal: Periods of time without injury will increase Outcome: Progressing

## 2024-05-08 NOTE — Progress Notes (Incomplete)
(  Sleep Hours) -13 (Any PRNs that were needed, meds refused, or side effects to meds)- vistaril  (Any disturbances and when (visitation, over night)- none (Concerns raised by the patient)- none (SI/HI/AVH)- denies all

## 2024-05-08 NOTE — Group Note (Signed)
 Date:  05/08/2024 Time:  11:54 AM  Group Topic/Focus:  Physical Wellness: Develop a deeper sense of awareness and connection with their bodies through guided meditation and grounding techniques. By engaging in these practices, the goal is to reduce stress, promote relaxation, and enhance overall well-being. Participants will learn tools they can incorporate into their daily lives to manage physical tension, emotional stress, and increase mindfulness. The session aims to foster a supportive environment where individuals can share experiences, gain confidence in using mindfulness techniques, and improve their ability to remain grounded in the present moment.  Participation Level:  Did Not Attend  Lauris JONELLE Morales 05/08/2024, 11:54 AM

## 2024-05-08 NOTE — Plan of Care (Signed)
   Problem: Education: Goal: Emotional status will improve Outcome: Progressing   Problem: Activity: Goal: Interest or engagement in activities will improve Outcome: Progressing

## 2024-05-08 NOTE — [facility] Suicide Risk Assessment (Signed)
 [facility] INPATIENT:  Family/Significant Other Suicide Prevention Education  Suicide Prevention Education:  Patient Refusal for Family/Significant Other Suicide Prevention Education: The patient Kaitlyn Gray has refused to provide written consent for family/significant other to be provided Family/Significant Other Suicide Prevention Education during admission and/or prior to discharge.  Physician notified.  [name] 05/08/2024, 4:41 PM

## 2024-05-08 NOTE — Group Note (Signed)
 Date:  05/08/2024 Time:  10:09 AM  Group Topic/Focus:  Goals Group:  Participants assessed their current level of intellectual wellness, identify areas for growth, and create personalized, achievable goals that foster lifelong learning, critical thinking, creativity, and engagement with thought-provoking activities. By the end of the session, each participant will have a clear, actionable SMART goal aimed at enhancing their intellectual well-being, with a plan for ongoing reflection and accountability to ensure progress.  Participation Level:  Did Not Attend   Lauris JONELLE Morales 05/08/2024, 10:09 AM

## 2024-05-08 NOTE — Progress Notes (Signed)
(  Sleep Hours) -8 (Any PRNs that were needed, meds refused, or side effects to meds)- vistaril  (Any disturbances and when (visitation, over night)- none (Concerns raised by the patient)- Patient reports needing help with medication, transportation and would like information for an ACTT team (SI/HI/AVH)- denies all

## 2024-05-08 NOTE — Group Note (Signed)
 Date:  05/08/2024 Time:  9:26 PM  Group Topic/Focus:  Wrap-Up Group:   The focus of this group is to help patients review their daily goal of treatment and discuss progress on daily workbooks.    Participation Level:  Did Not Attend  Participation Quality:  none  Affect:  none  Cognitive:  none  Insight: None  Engagement in Group:  None  Modes of Intervention:   None  Additional Comments:   Pt was encouraged bit refused to attend wrap up group  Kaitlyn Gray Kaitlyn Gray 05/08/2024, 9:26 PM

## 2024-05-08 NOTE — Progress Notes (Signed)
   05/08/24 2208  Psych Admission Type (Psych Patients Only)  Admission Status Voluntary  Psychosocial Assessment  Patient Complaints None  Eye Contact Fair  Facial Expression Anxious  Affect Appropriate to circumstance  Speech Logical/coherent  Interaction Assertive  Motor Activity Other (Comment) (pt in wheelchair)  Appearance/Hygiene Improved  Behavior Characteristics Appropriate to situation  Mood Pleasant  Thought Process  Coherency WDL  Content WDL  Delusions None reported or observed  Perception WDL  Hallucination None reported or observed  Judgment Poor  Confusion None  Danger to Self  Current suicidal ideation? Denies

## 2024-05-09 ENCOUNTER — Encounter (HOSPITAL_COMMUNITY): Payer: Self-pay

## 2024-05-09 LAB — GLUCOSE, CAPILLARY
Glucose-Capillary: 108 mg/dL — ABNORMAL HIGH (ref 70–99)
Glucose-Capillary: 80 mg/dL (ref 70–99)
Glucose-Capillary: 97 mg/dL (ref 70–99)
Glucose-Capillary: 97 mg/dL (ref 70–99)

## 2024-05-09 MED ORDER — QUETIAPINE FUMARATE 50 MG PO TABS
50.0000 mg | ORAL_TABLET | Freq: Every day | ORAL | Status: DC
  Administered 2024-05-09 – 2024-05-10 (×2): 50 mg via ORAL
  Filled 2024-05-09 (×3): qty 1

## 2024-05-09 NOTE — Group Note (Signed)
 Date:  05/09/2024 Time:  6:58 PM  Group Topic/Focus:  Occupational Therapy    Participation Level:  Did Not Attend  MATIS MONNIER 05/09/2024, 6:58 PM

## 2024-05-09 NOTE — Progress Notes (Signed)
 Patient agreed to let MHT to take CBG monitoring

## 2024-05-09 NOTE — Group Note (Signed)
 Date:  05/09/2024 Time:  8:06 PM  Group Topic/Focus:  Wrap-Up Group:   The focus of this group is to help patients review their daily goal of treatment and discuss progress on daily workbooks.    Participation Level:  Did Not Attend  Participation Quality:  none   Affect:  none  Cognitive:  none  Insight: None  Engagement in Group:  None  Modes of Intervention:  none  Additional Comments:   Pt did not attend AA meeting  Shaela Boer A Jaelan Rasheed 05/09/2024, 8:06 PM

## 2024-05-09 NOTE — Progress Notes (Signed)
 [facility] MD Progress Note  05/09/2024 11:38 AM Kaitlyn Gray  MRN:  [id number] Subjective:   Kaitlyn Gray is a 48 yr old female who presented on 11/21 to [facility] with chest pain, command hallucinations, and SI in the setting of medication non-compliance and substance abuse, she was admitted to [facility] on 11/22. PPHx is significant for Schizoaffective Disorder, Anxiety, PTSD, and Polysubstance Abuse (Cocaine, Meth, THC, Hx of Heroin), and multiple Suicide Attempts, Self Injurious Behavior (cutting), and Multiple Psychiatric Hospitalizations (last- [facility] 04/2024).    Case was discussed in the multidisciplinary team. MAR was reviewed and patient was compliant with medications.  She received PRN agitation Haldol  and Benadryl  yesterday.  She received PRN Hydroxyzine  last night.   Psychiatric Team made the following recommendations yesterday: -Increase Seroquel  300 mg QHS for mood stability and psychosis -Received Abilify  Maintena 400 mg on 11/10   On interview today patient reports she slept good last night.  She reports her appetite is doing fair.  She reports she had SI and hallucinations last night and that was why she required the agitation medications.  She reports this morning no SI, HI, or AVH.  She reports no issues with her medications.  Discussed with her that we would further increase her Seroquel  and she was agreeable.  During treatment team we discussed providing her information for residential rehab and an ACT team and she was agreeable.  She reports no other concerns at present.    Principal Problem: Schizoaffective disorder, bipolar type (HCC) Diagnosis: Principal Problem:   Schizoaffective disorder, bipolar type (HCC) Active Problems:   Polysubstance abuse (HCC)  Total Time spent with patient:  I personally spent 35 minutes on the unit in direct patient care. The direct patient care time included face-to-face time with the patient, reviewing the patient's chart, communicating  with other professionals, and coordinating care.    Past Psychiatric History:  Schizoaffective Disorder, Anxiety, PTSD, and Polysubstance Abuse (Cocaine, Meth, THC, Hx of Heroin), and multiple Suicide Attempts, Self Injurious Behavior (cutting), and Multiple Psychiatric Hospitalizations (last- [facility] 04/2024).   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) 07/19/2017   Hypertension    Hypertensive emergency 05/31/2022   Ischemic stroke (HCC) 01/01/2020   subacute right middle cerebellar peduncle and pons infarction   Left-sided weakness 01/27/2022   Head CT with remote right occipital infarct which is consistent with old left-sided weakness   MDD (major depressive disorder), recurrent severe, 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   Normocytic anemia 02/09/2020   Opiate use    History of Suboxone  Therapy until 05/2021   Polysubstance abuse (HCC) 07/19/2017   Prescription drug misadventures (Seroquel )     Past Surgical History:  Procedure Laterality Date   CESAREAN SECTION     Family History:  Family History  Problem Relation Age of Onset   Hypertension Mother    CAD Mother 20       died of MI at age 86   Hypertension  Father    Family Psychiatric  History:  Mother, Wylie, Higinio- Substance Abuse No Known Diagnosis' or Suicides  Social History:  Social History   Substance and Sexual Activity  Alcohol Use Not Currently     Social History    Substance and Sexual Activity  Drug Use Yes   Types: Marijuana, Cocaine, Crack cocaine   Comment: occassionally    Social History   Socioeconomic History   Marital status: Single    Spouse name: Not on file   Number of children: Not on file   Years of education: Not on file   Highest education level: Not on file  Occupational History   Not on file  Tobacco Use   Smoking status: Every Day    Current packs/day: 1.00    Average packs/day: 1 pack/day for 25.9 years (25.9 ttl pk-yrs)    Types: Cigarettes    Start date: 06/16/1998    Passive exposure: Current   Smokeless tobacco: Former  Building Services Engineer status: Every Day  Substance and Sexual Activity   Alcohol use: Not Currently   Drug use: Yes    Types: Marijuana, Cocaine, Crack cocaine    Comment: occassionally   Sexual activity: Never    Birth control/protection: None  Other Topics Concern   Not on file  Social History Narrative   ** Merged History Encounter **       Social Drivers of Health   Financial Resource Strain: Not on file  Food Insecurity: Food Insecurity Present (05/07/2024)   Hunger Vital Sign    Worried About Running Out of Food in the Last Year: Often true    Ran Out of Food in the Last Year: Often true  Transportation Needs: Unmet Transportation Needs (05/07/2024)   PRAPARE - Administrator, Civil Service (Medical): Yes    Lack of Transportation (Non-Medical): Yes  Physical Activity: Not on file  Stress: Not on file  Social Connections: Not on file   Additional Social History:                         Sleep: Good Estimated Sleeping Duration (Last 24 Hours): 12.25-14.00 hours (Due to [employer] Time, the durations displayed may not accurately represent documentation during the time change interval)  Appetite:  Fair  Current Medications: Current Facility-Administered Medications  Medication Dose Route Frequency Provider Last Rate Last Admin   acetaminophen   (TYLENOL ) tablet 650 mg  650 mg Oral Q6H PRN Motley-Mangrum, Jadeka A, PMHNP   650 mg at 05/07/24 1550   alum & mag hydroxide-simeth (MAALOX/MYLANTA) 200-200-20 MG/5ML suspension 30 mL  30 mL Oral Q4H PRN Motley-Mangrum, Jadeka A, PMHNP       benztropine  (COGENTIN ) tablet 1 mg  1 mg Oral BID Motley-Mangrum, Jadeka A, PMHNP       haloperidol  (HALDOL ) tablet 5 mg  5 mg Oral TID PRN Motley-Mangrum, Jadeka A, PMHNP   5 mg at 05/08/24 1840   And   diphenhydrAMINE  (BENADRYL ) capsule 50 mg  50 mg Oral TID PRN Motley-Mangrum, Jadeka A, PMHNP   50 mg at 05/08/24 1839   haloperidol  lactate (HALDOL ) injection 5 mg  5 mg Intramuscular TID PRN Motley-Mangrum, Jadeka A, PMHNP       And   diphenhydrAMINE  (BENADRYL ) injection 50 mg  50 mg Intramuscular TID PRN Motley-Mangrum, Jadeka A, PMHNP       And   LORazepam  (ATIVAN ) injection 2 mg  2 mg Intramuscular TID  PRN Motley-Mangrum, Jadeka A, PMHNP       haloperidol  lactate (HALDOL ) injection 10 mg  10 mg Intramuscular TID PRN Motley-Mangrum, Jadeka A, PMHNP       And   diphenhydrAMINE  (BENADRYL ) injection 50 mg  50 mg Intramuscular TID PRN Motley-Mangrum, Jadeka A, PMHNP       And   LORazepam  (ATIVAN ) injection 2 mg  2 mg Intramuscular TID PRN Motley-Mangrum, Jadeka A, PMHNP       feeding supplement (ENSURE PLUS HIGH PROTEIN) liquid 237 mL  237 mL Oral BID BM Astoria Condon S, DO   237 mL at 05/09/24 1011   hydrocerin (EUCERIN) cream   Topical BID Worley Radermacher S, DO   Given at 05/09/24 1011   hydrOXYzine  (ATARAX ) tablet 50 mg  50 mg Oral Q6H PRN Motley-Mangrum, Jadeka A, PMHNP   50 mg at 05/08/24 2142   insulin  aspart (novoLOG ) injection 0-15 Units  0-15 Units Subcutaneous TID WC Kahealani Yankovich S, DO   2 Units at 05/08/24 9358   insulin  aspart (novoLOG ) injection 0-5 Units  0-5 Units Subcutaneous QHS Adriana Quinby S, DO       losartan  (COZAAR ) tablet 50 mg  50 mg Oral Daily Caydon Feasel S, DO   50 mg at 05/09/24 1011   magnesium   hydroxide (MILK OF MAGNESIA) suspension 30 mL  30 mL Oral Daily PRN Motley-Mangrum, Jadeka A, PMHNP       metFORMIN  (GLUCOPHAGE ) tablet 500 mg  500 mg Oral BID WC Motley-Mangrum, Jadeka A, PMHNP   500 mg at 05/09/24 1011   QUEtiapine  (SEROQUEL ) tablet 300 mg  300 mg Oral QHS Annamarie Yamaguchi S, DO   300 mg at 05/08/24 2142   QUEtiapine  (SEROQUEL ) tablet 50 mg  50 mg Oral Q1200 Kyriakos Babler S, DO        Lab Results:  Results for orders placed or performed during the hospital encounter of 05/07/24 (from the past 48 hours)  Glucose, capillary     Status: Abnormal   Collection Time: 05/07/24  8:24 PM  Result Value Ref Range   Glucose-Capillary 118 (H) 70 - 99 mg/dL    Comment: Glucose reference range applies only to samples taken after fasting for at least 8 hours.   Comment 1 Notify RN    Comment 2 Document in Chart   Glucose, capillary     Status: Abnormal   Collection Time: 05/08/24  5:53 AM  Result Value Ref Range   Glucose-Capillary 129 (H) 70 - 99 mg/dL    Comment: Glucose reference range applies only to samples taken after fasting for at least 8 hours.  Glucose, capillary     Status: Abnormal   Collection Time: 05/08/24 11:59 AM  Result Value Ref Range   Glucose-Capillary 104 (H) 70 - 99 mg/dL    Comment: Glucose reference range applies only to samples taken after fasting for at least 8 hours.  Glucose, capillary     Status: Abnormal   Collection Time: 05/08/24  5:05 PM  Result Value Ref Range   Glucose-Capillary 104 (H) 70 - 99 mg/dL    Comment: Glucose reference range applies only to samples taken after fasting for at least 8 hours.  Glucose, capillary     Status: Abnormal   Collection Time: 05/08/24  7:51 PM  Result Value Ref Range   Glucose-Capillary 126 (H) 70 - 99 mg/dL    Comment: Glucose reference range applies only to samples taken after fasting for at least 8 hours.  Comment 1 Notify RN   Glucose, capillary     Status: None   Collection Time: 05/09/24   5:59 AM  Result Value Ref Range   Glucose-Capillary 97 70 - 99 mg/dL    Comment: Glucose reference range applies only to samples taken after fasting for at least 8 hours.    Blood Alcohol level:  Lab Results  Component Value Date   [facility] <15 05/06/2024   ETH <15 04/24/2024    Metabolic Disorder Labs: Lab Results  Component Value Date   HGBA1C 5.9 (H) 04/27/2024   MPG 122.63 04/27/2024   MPG 108.28 10/23/2023   Lab Results  Component Value Date   PROLACTIN 20.7 12/23/2022   Lab Results  Component Value Date   CHOL 154 04/27/2024   TRIG 53 04/27/2024   HDL 59 04/27/2024   CHOLHDL 2.6 04/27/2024   VLDL 11 04/27/2024   LDLCALC 85 04/27/2024   LDLCALC 64 10/23/2023    Physical Findings: AIMS:  ,  ,  ,  ,  ,  ,   CIWA:    COWS:     Musculoskeletal: Strength & Muscle Tone: within normal limits Gait & Station: normal Patient leans: N/A  Psychiatric Specialty Exam:  Presentation  General Appearance:  Casual  Eye Contact: Fair  Speech: Clear and Coherent; Normal Rate  Speech Volume: Normal  Handedness: Right   Mood and Affect  Mood: Dysphoric  Affect: Congruent   Thought Process  Thought Processes: Linear  Descriptions of Associations:Intact  Orientation:Full (Time, Place and Person)  Thought Content:WDL  History of Schizophrenia/Schizoaffective disorder:Yes  Duration of Psychotic Symptoms:Less than six months  Hallucinations:Hallucinations: -- (AVH yesterday but none at present)  Ideas of Reference:None  Suicidal Thoughts:Suicidal Thoughts: No  Homicidal Thoughts:Homicidal Thoughts: No   Sensorium  Memory: Immediate Fair  Judgment: Intact  Insight: Present   Executive Functions  Concentration: Fair  Attention Span: Fair  Recall: [employer] of Knowledge: Fair  Language: Good   Psychomotor Activity  Psychomotor Activity: Psychomotor Activity: Normal   Assets  Assets: Resilience   Sleep   Sleep: Sleep: Good    Physical Exam: Physical Exam Vitals and nursing note reviewed.  Constitutional:      General: She is not in acute distress.    Appearance: Normal appearance. She is obese. She is not ill-appearing or toxic-appearing.  HENT:     Head: Normocephalic and atraumatic.  Pulmonary:     Effort: Pulmonary effort is normal.  Neurological:     Mental Status: She is alert.    Review of Systems  Respiratory:  Negative for cough and shortness of breath.   Cardiovascular:  Negative for chest pain.  Gastrointestinal:  Negative for abdominal pain, constipation, diarrhea, nausea and vomiting.  Neurological:  Negative for dizziness, weakness and headaches.  Psychiatric/Behavioral:  Positive for depression. Negative for hallucinations and suicidal ideas. The patient is not nervous/anxious.    Blood pressure (!) 160/81, pulse 70, temperature (!) 97.4 F (36.3 C), resp. rate 18, height 5' 4 (1.626 m), weight 66 kg, last menstrual period 05/06/2024, SpO2 100%. Body mass index is 24.98 kg/m.   Treatment Plan Summary: Daily contact with patient to assess and evaluate symptoms and progress in treatment and Medication management  Kaitlyn Gray is a 48 yr old female who presented on 11/21 to [facility] with chest pain, command hallucinations, and SI in the setting of medication non-compliance and substance abuse,  she was admitted to [facility] on 11/22.  PPHx is significant  for Schizoaffective Disorder, Anxiety, PTSD, and Polysubstance Abuse (Cocaine, Meth, THC, Hx of Heroin), and multiple Suicide Attempts, Self Injurious Behavior (cutting), and Multiple Psychiatric Hospitalizations (last- [facility] 04/2024).     Maribell did have return of hallucinations and SI last night requiring agitation medications.  We will further increase her Seroquel  by adding a dose at noon.  Her blood pressure continues to be elevated so we will increase her Losartan .  Appreciate Social Works assistance in occupational hygienist for [employer] and [facility].  We will continue to monitor.      Schizoaffective Disorder, Bipolar Type: -Increase Seroquel  to 50 mg noon and 300 mg QHS for mood stability and psychosis -Received Abilify  Maintena 400 mg on 11/10 -Continue Agitation Protocol: Haldol /Ativan /Benadryl      Polysubstance Abuse: -Residential Rehab referral      HTN: -Increase Losartan  to 50 mg daily     Diabetes: -Continue Sliding Scale -Continue Metformin  500 mg BID     -Continue Ensure 237 mL BID -Continue Eucerin cream for Eczema  -Continue PRN's: Tylenol , Maalox, Atarax , Milk of Magnesia, Trazodone     --  The risks/benefits/side-effects/alternatives to medications were discussed in detail with the patient and time was given for questions. The patient consents to medication trials.                -- Metabolic profile and EKG monitoring obtained while on an atypical antipsychotic (BMI: 24.98 Lipid Panel: WNL, HbgA1c: 5.9, EKG: Sinus Rhythm w/ marked sinus Arrhythmia w/QTc: 426)              -- Encouraged patient to participate in unit milieu and in scheduled group therapies              -- Short Term Goals: Ability to identify changes in lifestyle to reduce recurrence of condition will improve, Ability to verbalize feelings will improve, Ability to disclose and discuss suicidal ideas, Ability to demonstrate self-control will improve, Ability to identify and develop effective coping behaviors will improve, Ability to maintain clinical measurements within normal limits will improve, Compliance with prescribed medications will improve, and Ability to identify triggers associated with substance abuse/mental health issues will improve             -- Long Term Goals: Improvement in symptoms so as ready for discharge   Safety and Monitoring:             -- Voluntary admission to inpatient psychiatric unit for safety, stabilization and treatment             -- Daily contact with patient to  assess and evaluate symptoms and progress in treatment             -- Patient's case to be discussed in multi-disciplinary team meeting             -- Observation Level : q15 minute checks             -- Vital signs:  q12 hours             -- Precautions: suicide, elopement, and assault  Discharge Planning:              -- Social work and case management to assist with discharge planning and identification of hospital follow-up needs prior to discharge             -- Estimated LOS: 3-5 more days             -- Discharge Concerns: Need to  establish a safety plan; Medication compliance and effectiveness             -- Discharge Goals: Return home with outpatient referrals for mental health follow-up including medication management/psychotherapy   Marsa GORMAN Rosser, DO 05/09/2024, 11:38 AM

## 2024-05-09 NOTE — Progress Notes (Signed)
 Patient heard yelling incoherently from her bedroom. When asked why she was agitated pt reports she can hear the voices. PRN PO haldol  5mg  and PO benadryl  50mg  administered from the mild agitation protocol. Safety maintained. Will continue to monitor.

## 2024-05-09 NOTE — Group Note (Signed)
 Date:  05/09/2024 Time:  6:49 PM  Group Topic/Focus:  Chaplain Group (Grief and Loss)    Participation Level:  Did Not Attend    Cruz JONETTA Mars 05/09/2024, 6:49 PM

## 2024-05-09 NOTE — BH IP Treatment Plan (Signed)
 Interdisciplinary Treatment and Diagnostic Plan Update  05/09/2024 Time of Session: 10:15 AM Kaitlyn Gray MRN: [id number]  Principal Diagnosis: Schizoaffective disorder, bipolar type (HCC)  Secondary Diagnoses: Principal Problem:   Schizoaffective disorder, bipolar type (HCC) Active Problems:   Polysubstance abuse (HCC)   Current Medications:  Current Facility-Administered Medications  Medication Dose Route Frequency Provider Last Rate Last Admin   acetaminophen  (TYLENOL ) tablet 650 mg  650 mg Oral Q6H PRN Motley-Mangrum, Jadeka A, PMHNP   650 mg at 05/07/24 1550   alum & mag hydroxide-simeth (MAALOX/MYLANTA) 200-200-20 MG/5ML suspension 30 mL  30 mL Oral Q4H PRN Motley-Mangrum, Jadeka A, PMHNP       benztropine  (COGENTIN ) tablet 1 mg  1 mg Oral BID Motley-Mangrum, Jadeka A, PMHNP       haloperidol  (HALDOL ) tablet 5 mg  5 mg Oral TID PRN Motley-Mangrum, Jadeka A, PMHNP   5 mg at 05/08/24 1840   And   diphenhydrAMINE  (BENADRYL ) capsule 50 mg  50 mg Oral TID PRN Motley-Mangrum, Jadeka A, PMHNP   50 mg at 05/08/24 1839   haloperidol  lactate (HALDOL ) injection 5 mg  5 mg Intramuscular TID PRN Motley-Mangrum, Jadeka A, PMHNP       And   diphenhydrAMINE  (BENADRYL ) injection 50 mg  50 mg Intramuscular TID PRN Motley-Mangrum, Jadeka A, PMHNP       And   LORazepam  (ATIVAN ) injection 2 mg  2 mg Intramuscular TID PRN Motley-Mangrum, Jadeka A, PMHNP       haloperidol  lactate (HALDOL ) injection 10 mg  10 mg Intramuscular TID PRN Motley-Mangrum, Jadeka A, PMHNP       And   diphenhydrAMINE  (BENADRYL ) injection 50 mg  50 mg Intramuscular TID PRN Motley-Mangrum, Jadeka A, PMHNP       And   LORazepam  (ATIVAN ) injection 2 mg  2 mg Intramuscular TID PRN Motley-Mangrum, Jadeka A, PMHNP       feeding supplement (ENSURE PLUS HIGH PROTEIN) liquid 237 mL  237 mL Oral BID BM Pashayan, Alexander S, DO   237 mL at 05/09/24 1011   hydrocerin (EUCERIN) cream   Topical BID Pashayan, Alexander S, DO   Given  at 05/09/24 1011   hydrOXYzine  (ATARAX ) tablet 50 mg  50 mg Oral Q6H PRN Motley-Mangrum, Jadeka A, PMHNP   50 mg at 05/08/24 2142   insulin  aspart (novoLOG ) injection 0-15 Units  0-15 Units Subcutaneous TID WC Pashayan, Alexander S, DO   2 Units at 05/08/24 9358   insulin  aspart (novoLOG ) injection 0-5 Units  0-5 Units Subcutaneous QHS Pashayan, Alexander S, DO       losartan  (COZAAR ) tablet 50 mg  50 mg Oral Daily Pashayan, Alexander S, DO   50 mg at 05/09/24 1011   magnesium  hydroxide (MILK OF MAGNESIA) suspension 30 mL  30 mL Oral Daily PRN Motley-Mangrum, Jadeka A, PMHNP       metFORMIN  (GLUCOPHAGE ) tablet 500 mg  500 mg Oral BID WC Motley-Mangrum, Jadeka A, PMHNP   500 mg at 05/09/24 1011   QUEtiapine  (SEROQUEL ) tablet 300 mg  300 mg Oral QHS Pashayan, Alexander S, DO   300 mg at 05/08/24 2142   QUEtiapine  (SEROQUEL ) tablet 50 mg  50 mg Oral Q1200 Pashayan, Alexander S, DO   50 mg at 05/09/24 1253   PTA Medications: Medications Prior to Admission  Medication Sig Dispense Refill Last Dose/Taking   [START ON 05/23/2024] ARIPiprazole  ER (ABILIFY  MAINTENA) 400 MG SRER injection Inject 2 mLs (400 mg total) into the muscle every 28 (twenty-eight) days.  1 each 0    benztropine  (COGENTIN ) 1 MG tablet Take 1 tablet (1 mg total) by mouth 2 (two) times daily. 60 tablet 0    hydrOXYzine  (ATARAX ) 50 MG tablet Take 1 tablet (50 mg total) by mouth 3 (three) times daily as needed for anxiety. 90 tablet 0    losartan  (COZAAR ) 25 MG tablet Take 1 tablet (25 mg total) by mouth daily. For hypertension. 30 tablet 0    metFORMIN  (GLUCOPHAGE ) 500 MG tablet Take 1 tablet (500 mg total) by mouth 2 (two) times daily with a meal. For diabetes management. (Patient not taking: Reported on 04/25/2024) 60 tablet 0    metFORMIN  (GLUCOPHAGE ) 500 MG tablet Take 1 tablet (500 mg total) by mouth 2 (two) times daily with a meal. 60 tablet 0    QUEtiapine  (SEROQUEL ) 200 MG tablet Take 1 tablet (200 mg total) by mouth at bedtime.  For mood control 30 tablet 0    QUEtiapine  (SEROQUEL ) 50 MG tablet Take 1 tablet (50 mg total) by mouth 2 (two) times daily. For agitation. 60 tablet 0     Patient Stressors: Financial difficulties   Health problems   Medication change or noncompliance   Substance abuse   Traumatic event    Patient Strengths: Capable of independent living  Communication skills   Treatment Modalities: Medication Management, Group therapy, Case management,  1 to 1 session with clinician, Psychoeducation, Recreational therapy.   Physician Treatment Plan for Primary Diagnosis: Schizoaffective disorder, bipolar type (HCC) Long Term Goal(s): Improvement in symptoms so as ready for discharge   Short Term Goals: Ability to identify changes in lifestyle to reduce recurrence of condition will improve Ability to verbalize feelings will improve Ability to disclose and discuss suicidal ideas Ability to demonstrate self-control will improve Ability to identify and develop effective coping behaviors will improve Ability to maintain clinical measurements within normal limits will improve Compliance with prescribed medications will improve Ability to identify triggers associated with substance abuse/mental health issues will improve  Medication Management: Evaluate patient's response, side effects, and tolerance of medication regimen.  Therapeutic Interventions: 1 to 1 sessions, Unit Group sessions and Medication administration.  Evaluation of Outcomes: Not Progressing  Physician Treatment Plan for Secondary Diagnosis: Principal Problem:   Schizoaffective disorder, bipolar type (HCC) Active Problems:   Polysubstance abuse (HCC)  Long Term Goal(s): Improvement in symptoms so as ready for discharge   Short Term Goals: Ability to identify changes in lifestyle to reduce recurrence of condition will improve Ability to verbalize feelings will improve Ability to disclose and discuss suicidal ideas Ability to  demonstrate self-control will improve Ability to identify and develop effective coping behaviors will improve Ability to maintain clinical measurements within normal limits will improve Compliance with prescribed medications will improve Ability to identify triggers associated with substance abuse/mental health issues will improve     Medication Management: Evaluate patient's response, side effects, and tolerance of medication regimen.  Therapeutic Interventions: 1 to 1 sessions, Unit Group sessions and Medication administration.  Evaluation of Outcomes: Not Progressing   RN Treatment Plan for Primary Diagnosis: Schizoaffective disorder, bipolar type (HCC) Long Term Goal(s): Knowledge of disease and therapeutic regimen to maintain health will improve  Short Term Goals: Ability to remain free from injury will improve, Ability to verbalize frustration and anger appropriately will improve, Ability to demonstrate self-control, Ability to participate in decision making will improve, Ability to verbalize feelings will improve, Ability to disclose and discuss suicidal ideas, Ability to identify and develop effective  coping behaviors will improve, and Compliance with prescribed medications will improve  Medication Management: RN will administer medications as ordered by provider, will assess and evaluate patient's response and provide education to patient for prescribed medication. RN will report any adverse and/or side effects to prescribing provider.  Therapeutic Interventions: 1 on 1 counseling sessions, Psychoeducation, Medication administration, Evaluate responses to treatment, Monitor vital signs and CBGs as ordered, Perform/monitor CIWA, COWS, AIMS and Fall Risk screenings as ordered, Perform wound care treatments as ordered.  Evaluation of Outcomes: Not Progressing   LCSW Treatment Plan for Primary Diagnosis: Schizoaffective disorder, bipolar type (HCC) Long Term Goal(s): Safe transition to  appropriate next level of care at discharge, Engage patient in therapeutic group addressing interpersonal concerns.  Short Term Goals: Engage patient in aftercare planning with referrals and resources, Increase social support, Increase ability to appropriately verbalize feelings, Increase emotional regulation, Facilitate acceptance of mental health diagnosis and concerns, Facilitate patient progression through stages of change regarding substance use diagnoses and concerns, Identify triggers associated with mental health/substance abuse issues, and Increase skills for wellness and recovery  Therapeutic Interventions: Assess for all discharge needs, 1 to 1 time with Social worker, Explore available resources and support systems, Assess for adequacy in community support network, Educate family and significant other(s) on suicide prevention, Complete Psychosocial Assessment, Interpersonal group therapy.  Evaluation of Outcomes: Not Progressing   Progress in Treatment: Attending groups: No. Participating in groups: No. Taking medication as prescribed: No. Toleration medication: Yes. Family/Significant other contact made: No, will contact:  patient declined consents. Patient understands diagnosis: Yes. Discussing patient identified problems/goals with staff: Yes. Medical problems stabilized or resolved: Yes. Denies suicidal/homicidal ideation: Yes. Issues/concerns per patient self-inventory: No. None reported.  New problem(s) identified: No, Describe:  None identified.  New Short Term/Long Term Goal(s): detox, medication management for mood stabilization; elimination of SI thoughts; development of comprehensive mental wellness/sobriety plan  Patient Goals: Get back on my medications, learn coping skills, learn how to deal with things.  Discharge Plan or Barriers: Patient recently admitted. CSW will continue to follow and assess for appropriate referrals and possible discharge planning.     Reason for Continuation of Hospitalization: Depression Medication stabilization Withdrawal symptoms  Estimated Length of Stay: 3-5 days.   Last 3 [place] Suicide Severity Risk Score: Flowsheet Row Ad[facility] (Current) [facility]06/2024 [facility] Admission (Discharged) [facility]  C-SSRS RISK CATEGORY High Risk No Risk High Risk    Last PHQ 2/9 Scores:    07/17/2022    1:34 PM 12/12/2021    5:37 PM 07/15/2017    7:12 PM  Depression screen PHQ 2/9  Decreased Interest  1 2  Down, Depressed, Hopeless 2 1 2   PHQ - 2 Score 2 2 4   Altered sleeping 3 2 1   Tired, decreased energy 3 2 1   Change in appetite 3 1 1   Feeling bad or failure about yourself  3 2 1   Trouble concentrating 3 1 1   Moving slowly or fidgety/restless 3 1 0  Suicidal thoughts 1 1 1   PHQ-9 Score 21  12  10    Difficult doing work/chores Extremely dIfficult Somewhat difficult Somewhat difficult     Data saved with a previous flowsheet row definition    Scribe for Treatment Team: Louetta Wynona SILK 05/09/2024 1:57 PM

## 2024-05-09 NOTE — Plan of Care (Signed)
  Problem: Education: Goal: Knowledge of [place] [facility] Education information/materials will improve Outcome: Progressing Goal: Emotional status will improve Outcome: Progressing Goal: Mental status will improve Outcome: Progressing Goal: Verbalization of understanding the information provided will improve Outcome: Progressing   Problem: Activity: Goal: Interest or engagement in activities will improve Outcome: Progressing   Problem: Coping: Goal: Ability to demonstrate self-control will improve Outcome: Progressing   Problem: Safety: Goal: Periods of time without injury will increase Outcome: Progressing

## 2024-05-09 NOTE — Progress Notes (Signed)
   05/09/24 1000  Psych Admission Type (Psych Patients Only)  Admission Status Voluntary  Psychosocial Assessment  Patient Complaints Other (Comment) (AH)  Eye Contact Fair  Facial Expression Anxious;Flat  Affect Appropriate to circumstance  Speech Logical/coherent;Theme Park Manager (in wheelchair)  Appearance/Hygiene Agricultural Consultant Cooperative;Appropriate to situation;Agitated  Mood Pleasant;Depressed;Anxious  Thought Process  Coherency WDL  Content WDL  Delusions None reported or observed  Perception Hallucinations  Hallucination Auditory  Judgment Poor  Confusion None  Danger to Self  Current suicidal ideation? Denies  Self-Injurious Behavior No self-injurious ideation or behavior indicators observed or expressed   Agreement Not to Harm Self Yes  Description of Agreement verbal  Danger to Others  Danger to Others None reported or observed

## 2024-05-09 NOTE — Progress Notes (Signed)
 Patient wakes up and approaches the nursing station, patient states why you aint wake me up to take my medication for my nightmare. Patient reassured that this rn did attempt to wake her up but it was unsuccessful. Patient requesting her medications at this moment.

## 2024-05-09 NOTE — Progress Notes (Signed)
   05/09/24 2300  Psych Admission Type (Psych Patients Only)  Admission Status Voluntary  Psychosocial Assessment  Patient Complaints Depression  Eye Contact Avoids  Facial Expression Flat  Affect Depressed  Speech Incoherent  Interaction Isolative  Motor Activity Unsteady (wheelchair)  Appearance/Hygiene Disheveled;Poor hygiene  Behavior Characteristics Unwilling to participate  Mood Depressed  Thought Process  Coherency WDL  Content WDL  Delusions None reported or observed  Perception WDL  Hallucination None reported or observed  Judgment Poor  Confusion None  Danger to Self  Current suicidal ideation? Denies  Self-Injurious Behavior No self-injurious ideation or behavior indicators observed or expressed   Agreement Not to Harm Self Yes  Description of Agreement Verbal  Danger to Others  Danger to Others None reported or observed

## 2024-05-09 NOTE — Progress Notes (Signed)
 Patient refused CBG monitoring. Patient educated on the reasoning for this task. Patient educate that medication cannot be given without results. Patient continued to refuse CBG monitoring. Benefits and risk education given to the patient. Provider notified.

## 2024-05-09 NOTE — Group Note (Signed)
 Date:  05/09/2024 Time:  9:51 AM  Group Topic/Focus:  Goals Group:   The focus of this group is to help patients establish daily goals to achieve during treatment and discuss how the patient can incorporate goal setting into their daily lives to aide in recovery.    Participation Level:  Did not attend  Participation Quality:  Did not attend  Affect:  Did not attend  Cognitive:  Did not attend  Insight: None  Engagement in Group:  Did not attend  Modes of Intervention:  Did not attend  Additional Comments:  Did not attend  Kaitlyn Gray 05/09/2024, 9:51 AM

## 2024-05-09 NOTE — Group Note (Signed)
 Recreation Therapy Group Note   Group Topic:Team Building  Group Date: 05/09/2024 Start Time: 0940 End Time: 1005 Facilitators: Byanca Kasper-McCall, LRT,CTRS Location: 300 Hall Dayroom   Group Topic: Communication, Team Building, Problem Solving  Goal Area(s) Addresses:  Patient will effectively work with peer towards shared goal.  Patient will identify skills used to make activity successful.  Patient will share challenges and verbalize solution-driven approaches used. Patient will identify how skills used during activity can be used to reach post d/c goals.   Behavioral Response:   Intervention: STEM Activity   Activity: [employer]. Patients were provided the following materials: 4 drinking straws, 5 rubber bands, 5 paper clips, 2 index cards and 2 drinking cups. Using the provided materials patients were asked to build a launching mechanism to launch a ping pong ball across the room, approximately 10 feet. Patients were divided into teams of 3-5. Instructions required all materials be incorporated into the device, functionality of items left to the peer group's discretion.  Education: Pharmacist, Community, Scientist, Physiological, Air Cabin Crew, Building Control Surveyor.   Education Outcome: Acknowledges education/In group clarification offered/Needs additional education.    Affect/Mood: N/A   Participation Level: Did not attend    Clinical Observations/Individualized Feedback:      Plan: Continue to engage patient in RT group sessions 2-3x/week.   Zainab Crumrine-McCall, LRT,CTRS 05/09/2024 12:02 PM

## 2024-05-09 NOTE — Group Note (Signed)
 Date:  05/09/2024 Time:  1:15 PM  Group Topic/Focus:  Physical Wellness:   The focus of this group is to encourage patients  to improve and maintain their physical health through regular movement, exercise, and wellness practices. Today, the patients followed an exercise video.    Participation Level:  Did Not Attend   Kaitlyn Gray 05/09/2024, 1:15 PM

## 2024-05-10 ENCOUNTER — Other Ambulatory Visit: Payer: Self-pay

## 2024-05-10 ENCOUNTER — Other Ambulatory Visit (HOSPITAL_COMMUNITY): Payer: Self-pay

## 2024-05-10 LAB — GLUCOSE, CAPILLARY
Glucose-Capillary: 80 mg/dL (ref 70–99)
Glucose-Capillary: 79 mg/dL (ref 70–99)
Glucose-Capillary: 85 mg/dL (ref 70–99)
Glucose-Capillary: 130 mg/dL — ABNORMAL HIGH (ref 70–99)

## 2024-05-10 NOTE — [facility] Group Notes (Signed)
 [facility] Group Notes:  (Nursing/MHT/Case Management/Adjunct)  Date:  05/10/2024  Time:  9:10 PM  Type of Therapy:  Psychoeducational Skills  Participation Level:  Active  Participation Quality:  Attentive  Affect:  Excited  Cognitive:  Appropriate  Insight:  Improving  Engagement in Group:  Improving  Modes of Intervention:  Education  Summary of Progress/Problems: Patient states that her day began as a 5 out of a possible 10 but ended as a 8 out of 10. She shared with the group that she is unaware of her discharge plans and is having issues with her disability check. Her goal for today was to socialize more which she accomplished.   Ellaina Schuler S 05/10/2024, 9:10 PM

## 2024-05-10 NOTE — Progress Notes (Incomplete)
 Patient was isolative to room for the majority of the shift. Patient t

## 2024-05-10 NOTE — Progress Notes (Signed)
 [facility] MD Progress Note  05/10/2024 8:37 AM Kaitlyn Gray  MRN:  [id number]  Principal Problem: Schizoaffective disorder, bipolar type (HCC) Diagnosis: Principal Problem:   Schizoaffective disorder, bipolar type (HCC) Active Problems:   Polysubstance abuse (HCC)  Total Time spent with patient:  I personally spent 35 minutes on the unit in direct patient care. The direct patient care time included face-to-face time with the patient, reviewing the patient's chart, communicating with other professionals, and coordinating care.    Identifying Information and past psychiatric history: Kaitlyn Gray is a 48 yr old female with a psychiatric history currently most consistent with SAD-BT, stimulant use disorder, severe, and hx of opioid use disorder admitted with command hallucinations and SI in the setting of medication non-compliance and substance abuse   Psychiatric history is significant for prior diagnoses of Schizoaffective Disorder, Anxiety, PTSD, and Polysubstance Abuse (Cocaine, Meth, THC, Hx of Heroin). She has numerous psychiatric admissions for a combination of mood and psychotic symptoms in the setting of heavy substance use. Last admitted earlier in November 2025. She has also endorsed multiple Suicide Attempts, Self Injurious Behavior.   Interval events: The patient did not attend groups yesterday. She required PO agitation protocol (haldol  5 mg and benadryl  50 mg) due to [facility] - was yelling in bedroom due to voices. Noted by staff to be flat in affect, ongoing hallucinations and poor insight/judgement. Slept 10 hrs overnight. Denying SI, HI. BP (!) 141/94 (BP Location: Right Arm) Comment: Rn aware, pt asymptomatic, will continue to monitor  Pulse 74   Temp (!) 97.4 F (36.3 C)   Resp 18   Ht 5' 4 (1.626 m)   Wt 66 kg   LMP 05/06/2024   SpO2 100%   BMI 24.98 kg/m     Interview On interview today the patient states she is not good. When asked what is not good she shakes  her head and does not volunteer additional information. Does not answer questions about SI, HI or AVH, does mumble something unintelligible to this author. States I need to pee and pushes by. 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) 07/19/2017   Hypertension    Hypertensive emergency 05/31/2022   Ischemic stroke (HCC) 01/01/2020   subacute right middle cerebellar peduncle and pons infarction   Left-sided weakness 01/27/2022   Head CT with remote right occipital infarct which is consistent with old left-sided weakness   MDD (major depressive disorder), recurrent severe, 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   Normocytic anemia 02/09/2020   Opiate use    History of Suboxone  Therapy until 05/2021   Polysubstance abuse (HCC) 07/19/2017   Prescription drug misadventures (Seroquel )     Past Surgical History:  Procedure Laterality Date   CESAREAN SECTION     Family History:  Family History  Problem Relation Age of Onset   Hypertension Mother    CAD Mother 18  died of MI at age 28   Hypertension Father    Family Psychiatric  History:  Mother, Wylie, Higinio- Substance Abuse No Known Diagnosis' or Suicides  Social History:  Social History   Substance and Sexual Activity  Alcohol Use Not Currently     Social History   Substance and Sexual Activity  Drug Use Yes   Types:  Marijuana, Cocaine, Crack cocaine   Comment: occassionally    Social History   Socioeconomic History   Marital status: Single    Spouse name: Not on file   Number of children: Not on file   Years of education: Not on file   Highest education level: Not on file  Occupational History   Not on file  Tobacco Use   Smoking status: Every Day    Current packs/day: 1.00    Average packs/day: 1 pack/day for 25.9 years (25.9 ttl pk-yrs)    Types: Cigarettes    Start date: 06/16/1998    Passive exposure: Current   Smokeless tobacco: Former  Building Services Engineer status: Every Day  Substance and Sexual Activity   Alcohol use: Not Currently   Drug use: Yes    Types: Marijuana, Cocaine, Crack cocaine    Comment: occassionally   Sexual activity: Never    Birth control/protection: None  Other Topics Concern   Not on file  Social History Narrative   ** Merged History Encounter **       Social Drivers of Health   Financial Resource Strain: Not on file  Food Insecurity: Food Insecurity Present (05/07/2024)   Hunger Vital Sign    Worried About Running Out of Food in the Last Year: Often true    Ran Out of Food in the Last Year: Often true  Transportation Needs: Unmet Transportation Needs (05/07/2024)   PRAPARE - Administrator, Civil Service (Medical): Yes    Lack of Transportation (Non-Medical): Yes  Physical Activity: Not on file  Stress: Not on file  Social Connections: Not on file    Current Medications: Current Facility-Administered Medications  Medication Dose Route Frequency Provider Last Rate Last Admin   acetaminophen  (TYLENOL ) tablet 650 mg  650 mg Oral Q6H PRN Motley-Mangrum, Jadeka A, PMHNP   650 mg at 05/07/24 1550   alum & mag hydroxide-simeth (MAALOX/MYLANTA) 200-200-20 MG/5ML suspension 30 mL  30 mL Oral Q4H PRN Motley-Mangrum, Jadeka A, PMHNP       benztropine  (COGENTIN ) tablet 1 mg  1 mg Oral BID Motley-Mangrum, Jadeka A, PMHNP       haloperidol   (HALDOL ) tablet 5 mg  5 mg Oral TID PRN Motley-Mangrum, Jadeka A, PMHNP   5 mg at 05/09/24 1800   And   diphenhydrAMINE  (BENADRYL ) capsule 50 mg  50 mg Oral TID PRN Motley-Mangrum, Jadeka A, PMHNP   50 mg at 05/09/24 1800   haloperidol  lactate (HALDOL ) injection 5 mg  5 mg Intramuscular TID PRN Motley-Mangrum, Jadeka A, PMHNP       And   diphenhydrAMINE  (BENADRYL ) injection 50 mg  50 mg Intramuscular TID PRN Motley-Mangrum, Jadeka A, PMHNP       And   LORazepam  (ATIVAN ) injection 2 mg  2 mg Intramuscular TID PRN Motley-Mangrum, Jadeka A, PMHNP       haloperidol  lactate (HALDOL ) injection 10 mg  10 mg Intramuscular TID PRN Motley-Mangrum, Jadeka A, PMHNP       And   diphenhydrAMINE  (BENADRYL ) injection 50 mg  50 mg Intramuscular TID PRN Motley-Mangrum, Jadeka A,  PMHNP       And   LORazepam  (ATIVAN ) injection 2 mg  2 mg Intramuscular TID PRN Motley-Mangrum, Jadeka A, PMHNP       feeding supplement (ENSURE PLUS HIGH PROTEIN) liquid 237 mL  237 mL Oral BID BM Pashayan, Marsa RAMAN, DO   237 mL at 05/09/24 1709   hydrocerin (EUCERIN) cream   Topical BID Pashayan, Alexander S, DO   Given at 05/09/24 1011   hydrOXYzine  (ATARAX ) tablet 50 mg  50 mg Oral Q6H PRN Motley-Mangrum, Jadeka A, PMHNP   50 mg at 05/08/24 2142   insulin  aspart (novoLOG ) injection 0-15 Units  0-15 Units Subcutaneous TID WC Pashayan, Alexander S, DO   2 Units at 05/08/24 9358   insulin  aspart (novoLOG ) injection 0-5 Units  0-5 Units Subcutaneous QHS Pashayan, Alexander S, DO       losartan  (COZAAR ) tablet 50 mg  50 mg Oral Daily Pashayan, Alexander S, DO   50 mg at 05/09/24 1011   magnesium  hydroxide (MILK OF MAGNESIA) suspension 30 mL  30 mL Oral Daily PRN Motley-Mangrum, Jadeka A, PMHNP       metFORMIN  (GLUCOPHAGE ) tablet 500 mg  500 mg Oral BID WC Motley-Mangrum, Jadeka A, PMHNP   500 mg at 05/09/24 1711   QUEtiapine  (SEROQUEL ) tablet 300 mg  300 mg Oral QHS Pashayan, Alexander S, DO   300 mg at 05/09/24 2147   QUEtiapine   (SEROQUEL ) tablet 50 mg  50 mg Oral Q1200 Pashayan, Alexander S, DO   50 mg at 05/09/24 1253    Lab Results:  Results for orders placed or performed during the hospital encounter of 05/07/24 (from the past 48 hours)  Glucose, capillary     Status: Abnormal   Collection Time: 05/08/24 11:59 AM  Result Value Ref Range   Glucose-Capillary 104 (H) 70 - 99 mg/dL    Comment: Glucose reference range applies only to samples taken after fasting for at least 8 hours.  Glucose, capillary     Status: Abnormal   Collection Time: 05/08/24  5:05 PM  Result Value Ref Range   Glucose-Capillary 104 (H) 70 - 99 mg/dL    Comment: Glucose reference range applies only to samples taken after fasting for at least 8 hours.  Glucose, capillary     Status: Abnormal   Collection Time: 05/08/24  7:51 PM  Result Value Ref Range   Glucose-Capillary 126 (H) 70 - 99 mg/dL    Comment: Glucose reference range applies only to samples taken after fasting for at least 8 hours.   Comment 1 Notify RN   Glucose, capillary     Status: None   Collection Time: 05/09/24  5:59 AM  Result Value Ref Range   Glucose-Capillary 97 70 - 99 mg/dL    Comment: Glucose reference range applies only to samples taken after fasting for at least 8 hours.  Glucose, capillary     Status: Abnormal   Collection Time: 05/09/24 11:48 AM  Result Value Ref Range   Glucose-Capillary 108 (H) 70 - 99 mg/dL    Comment: Glucose reference range applies only to samples taken after fasting for at least 8 hours.  Glucose, capillary     Status: None   Collection Time: 05/09/24  5:07 PM  Result Value Ref Range   Glucose-Capillary 80 70 - 99 mg/dL    Comment: Glucose reference range applies only to samples taken after fasting for at least 8 hours.  Glucose, capillary     Status: None  Collection Time: 05/09/24 11:56 PM  Result Value Ref Range   Glucose-Capillary 97 70 - 99 mg/dL    Comment: Glucose reference range applies only to samples taken after  fasting for at least 8 hours.  Glucose, capillary     Status: None   Collection Time: 05/10/24  6:09 AM  Result Value Ref Range   Glucose-Capillary 80 70 - 99 mg/dL    Comment: Glucose reference range applies only to samples taken after fasting for at least 8 hours.    Blood Alcohol level:  Lab Results  Component Value Date   [facility] <15 05/06/2024   ETH <15 04/24/2024    Metabolic Disorder Labs: Lab Results  Component Value Date   HGBA1C 5.9 (H) 04/27/2024   MPG 122.63 04/27/2024   MPG 108.28 10/23/2023   Lab Results  Component Value Date   PROLACTIN 20.7 12/23/2022   Lab Results  Component Value Date   CHOL 154 04/27/2024   TRIG 53 04/27/2024   HDL 59 04/27/2024   CHOLHDL 2.6 04/27/2024   VLDL 11 04/27/2024   LDLCALC 85 04/27/2024   LDLCALC 64 10/23/2023    Physical Findings:  Mental Status exam: Appearance: black female, fair hygiene and grooming, seen initially laying in bed. Does get up (without walker or wheelchair) and ambulates to bathroom during interview  Eye contact: limited  Attitude towards examiner irritable, disengaged  Psychomotor: no agitation or retardation  Speech: reduced amount, one-word sentences or unintelligible mumgling  Language: no overt delays on limited interview  Mood: not good  Affect: congruent, irritable and dysphoric  Thought content: did not respond to questions about SI or HI today, did not voice delusional content on limited interview  Thought Process: difficult to gauge due to limited interview, possible disorganization given mumbling and poor enegagement  Perception: did not overtly voice AVH, not clearly RTIS during interview today  Insight: limited  Judgement: poor   Orientation: did not answer orientation questions  Attention/Concentration: limited - possibly due to disorganization, appears at least partly volitional  Memory/Cognition: not formally assessed   Fund of Knowledge: Average    Musculoskeletal: Strength &  Muscle Tone: within normal limits Gait & Station: normal Patient leans: N/A Physical Exam Vitals and nursing note reviewed.  Constitutional:      General: She is not in acute distress.    Appearance: Normal appearance. She is obese. She is not ill-appearing or toxic-appearing.  HENT:     Head: Normocephalic and atraumatic.  Pulmonary:     Effort: Pulmonary effort is normal.  Neurological:     Mental Status: She is alert.    Review of Systems  Respiratory:  Negative for cough and shortness of breath.   Cardiovascular:  Negative for chest pain.  Gastrointestinal:  Negative for abdominal pain, constipation, diarrhea, nausea and vomiting.  Neurological:  Negative for dizziness, weakness and headaches.  Psychiatric/Behavioral:  Positive for depression. Negative for hallucinations and suicidal ideas. The patient is not nervous/anxious.    Blood pressure (!) 141/94, pulse 74, temperature (!) 97.4 F (36.3 C), resp. rate 18, height 5' 4 (1.626 m), weight 66 kg, last menstrual period 05/06/2024, SpO2 100%. Body mass index is 24.98 kg/m.   Treatment Plan Summary: Daily contact with patient to assess and evaluate symptoms and progress in treatment and Medication management  Assessment/plan: Kaitlyn Gray is a 48 yr old female with a psychiatric history most consistent with schizoaffective disorder, bipolar type, severe stimulant use, and hx of opioid use disorder who  presented on 11/21 to [facility] with chest pain, command hallucinations, and SI in the setting of medication non-compliance and substance abuse. She was admitted to [facility] on 11/22 under IVC. Although substance use confounds her clinical presentation, she has had recurrent inpatient admissions with significant psychotic symtpoms including hallucinatiosn, delusions, bizarre behavior, disorganization in thought process concerning for a primary underlying psychotic disorder. Given reports of prior manic symptoms (reduced sleep,  grandiosity, increased energy, lability in mood, etc.) SAD-BT has been the working diagnosis. She has continued to use stimulants - both cocaien and meth - heavily indicating an additional severe stimulant use disorder. She has previously used heroin, but unclear recent or current pattern of usage.     Today the patient was irritable and largely disengaged in interview. Was restricted in her affect, and although interview was limited, mumbling and poor attention concerning for some amount of disorganization in TP. She did require PRNs yesterday due to hallucinations. Since patient did recently receive LAI (on 11/10) and has utilized stimulants will give another few days to clear and continue to supplment with seroquel  for sleep, mood stability and psychotic symptoms.       Schizoaffective Disorder, Bipolar Type: -Continue Seroquel  to 50 mg noon and 300 mg QHS for mood stability and psychosis (last increased 11/24) -Received Abilify  Maintena 400 mg on 11/10 -Continue Agitation Protocol: Haldol /Ativan /Benadryl      Polysubstance Abuse: -[facility] referral      HTN: -Continue Losartan  to 50 mg daily     Diabetes: -Continue Sliding Scale -Continue Metformin  500 mg BID     -Continue Ensure 237 mL BID -Continue Eucerin cream for Eczema  -Continue PRN's: Tylenol , Maalox, Atarax , Milk of Magnesia, Trazodone     --  The risks/benefits/side-effects/alternatives to medications were discussed in detail with the patient and time was given for questions. The patient consents to medication trials.                -- Metabolic profile and EKG monitoring obtained while on an atypical antipsychotic (BMI: 24.98 Lipid Panel: WNL, HbgA1c: 5.9, EKG: Sinus Rhythm w/ marked sinus Arrhythmia w/QTc: 426)              -- Encouraged patient to participate in unit milieu and in scheduled group therapies              -- Short Term Goals: Ability to identify changes in lifestyle to reduce recurrence of  condition will improve, Ability to verbalize feelings will improve, Ability to disclose and discuss suicidal ideas, Ability to demonstrate self-control will improve, Ability to identify and develop effective coping behaviors will improve, Ability to maintain clinical measurements within normal limits will improve, Compliance with prescribed medications will improve, and Ability to identify triggers associated with substance abuse/mental health issues will improve             -- Long Term Goals: Improvement in symptoms so as ready for discharge   Safety and Monitoring:             -- Voluntary admission to inpatient psychiatric unit for safety, stabilization and treatment             -- Daily contact with patient to assess and evaluate symptoms and progress in treatment             -- Patient's case to be discussed in multi-disciplinary team meeting             -- Observation Level : q15 minute checks             --  Vital signs:  q12 hours             -- Precautions: suicide, elopement, and assault  Discharge Planning:              -- Social work and case management to assist with discharge planning and identification of hospital follow-up needs prior to discharge             -- Estimated LOS: 3-5 more days             -- Discharge Concerns: Need to establish a safety plan; Medication compliance and effectiveness             -- Discharge Goals: Return home with outpatient referrals for mental health follow-up including medication management/psychotherapy   Leita LOISE Arts, MD 05/10/2024, 8:37 AM

## 2024-05-10 NOTE — Progress Notes (Signed)
(  Sleep Hours) - 10 (Any PRNs that were needed, meds refused, or side effects to meds)- N/A  (Any disturbances and when (visitation, over night)- N/A  (Concerns raised by the patient)- N/A  (SI/HI/AVH)- DENIES

## 2024-05-10 NOTE — Group Note (Signed)
 Date:  05/10/2024 Time:  6:54 PM  Group Topic/Focus:  MHA/Coping Skills:   The purpose of this group is to help patients identify strategies for coping with mental health crisis.  Group discusses SMART goals    Participation Level:  Did Not Attend   Kaitlyn Gray 05/10/2024, 6:54 PM

## 2024-05-10 NOTE — Progress Notes (Signed)
 Patient's medication that was filled on 9/26 was sent to [facility]. [facility] called and told us  they have not seen this patient so medication has been sent back to pharmacy.   -Discharge notes from 9/18 state patient is to follow up with [facility].   -Patient was previously getting medication administered at [facility]. Spoke with someone at that facility and they informed me pt made an appointment with them in May of 2025 and never showed up.  -Attempted to contact patient to find out where medication needs to be delivered-LVM. She is currently admitted as of 11/22 and has not been discharged yet.

## 2024-05-10 NOTE — Group Note (Signed)
 LCSW Group Therapy Note   Group Date: 05/10/2024 Start Time: 1100 End Time: 1200   Participation:  patient was present for half of the group session.  Patient actively participated in the discussion.  Type of Therapy:  Group Therapy  Topic:  Stronger Together:  Building Healthy Relationships  Objective:  To explore loneliness, boundaries, and safe ways to build relationships.  Goals: Recognize healthy vs. unhealthy relationships. Learn safe ways to connect with others. Strengthen communication and murphy oil.  Summary:  Participants discussed loneliness, healthy connections, and setting boundaries. They explored safe ways to meet people and shared personal experiences. Key insights were reinforced through discussion and quotes.  Therapeutic Modalities Used: - Cognitive Behavioral Therapy (CBT) Elements - Identifying unhealthy relationship patterns, challenging negative thoughts about connection. - Dialectical Behavior Therapy (DBT) Elements - Interpersonal effectiveness, setting and maintaining boundaries. - Supportive Group Therapy - Peer discussion, shared experiences, and emotional validation.   Lamoine Magallon O Kaysin Brock, LCSWA 05/10/2024  12:51 PM

## 2024-05-10 NOTE — Group Note (Signed)
 Date:  05/10/2024 Time:  4:05 PM  Group Topic/Focus:  Self Esteem Action Plan:   The focus of this group is to help patients create a plan to continue to build self-esteem after discharge.    Participation Level:  Active  Participation Quality:  Appropriate  Affect:  Appropriate  Cognitive:  Appropriate  Insight: Appropriate  Engagement in Group:  Engaged  Modes of Intervention:  Discussion   Daxton Nydam 05/10/2024, 4:05 PM

## 2024-05-10 NOTE — Plan of Care (Signed)
 Patient meeting care plan goals.  Problem: Education: Goal: Knowledge of [facility] Education information/materials will improve Outcome: Progressing Goal: Emotional status will improve Outcome: Progressing Goal: Mental status will improve Outcome: Progressing Goal: Verbalization of understanding the information provided will improve Outcome: Progressing   Problem: Activity: Goal: Interest or engagement in activities will improve Outcome: Progressing Goal: Sleeping patterns will improve Outcome: Progressing   Problem: Coping: Goal: Ability to verbalize frustrations and anger appropriately will improve Outcome: Progressing Goal: Ability to demonstrate self-control will improve Outcome: Progressing   Problem: Health Behavior/Discharge Planning: Goal: Identification of resources available to assist in meeting health care needs will improve Outcome: Progressing Goal: Compliance with treatment plan for underlying cause of condition will improve Outcome: Progressing   Problem: Physical Regulation: Goal: Ability to maintain clinical measurements within normal limits will improve Outcome: Progressing   Problem: Safety: Goal: Periods of time without injury will increase Outcome: Progressing   Problem: Education: Goal: Ability to describe self-care measures that may prevent or decrease complications (Diabetes Survival Skills Education) will improve Outcome: Progressing Goal: Individualized Educational Video(s) Outcome: Progressing   Problem: Coping: Goal: Ability to adjust to condition or change in health will improve Outcome: Progressing   Problem: Fluid Volume: Goal: Ability to maintain a balanced intake and output will improve Outcome: Progressing   Problem: Health Behavior/Discharge Planning: Goal: Ability to identify and utilize available resources and services will improve Outcome: Progressing Goal: Ability to manage health-related needs will  improve Outcome: Progressing   Problem: Metabolic: Goal: Ability to maintain appropriate glucose levels will improve Outcome: Progressing   Problem: Nutritional: Goal: Maintenance of adequate nutrition will improve Outcome: Progressing Goal: Progress toward achieving an optimal weight will improve Outcome: Progressing   Problem: Skin Integrity: Goal: Risk for impaired skin integrity will decrease Outcome: Progressing   Problem: Tissue Perfusion: Goal: Adequacy of tissue perfusion will improve Outcome: Progressing

## 2024-05-10 NOTE — Plan of Care (Signed)
  Problem: Education: Goal: Mental status will improve Outcome: Progressing   Problem: Activity: Goal: Sleeping patterns will improve Outcome: Progressing   Problem: Activity: Goal: Interest or engagement in activities will improve Outcome: Not Progressing

## 2024-05-10 NOTE — Group Note (Signed)
 Recreation Therapy Group Note   Group Topic:Animal Assisted Therapy   Group Date: 05/10/2024 Start Time: 9052 End Time: 1030 Facilitators: Kerry Chisolm-McCall, LRT,CTRS Location: [address] Dayroom   Animal-Assisted Activity (AAA) Program Checklist/Progress Notes Patient Eligibility Criteria Checklist & Daily Group note for Rec Tx Intervention  AAA/T Program Assumption of Risk Form signed by Patient/ or Parent Legal Guardian Yes  Patient is free of allergies or severe asthma Yes  Patient reports no fear of animals Yes  Patient reports no history of cruelty to animals Yes  Patient understands his/her participation is voluntary Yes  Patient washes hands before animal contact Yes  Patient washes hands after animal contact Yes  Behavioral Response: Receptive   Education: Charity Fundraiser, Appropriate Animal Interaction   Education Outcome: Acknowledges education.    Affect/Mood: Flat   Participation Level: Moderate   Participation Quality: Independent   Behavior: Interactive    Speech/Thought Process: Relevant   Insight: Moderate   Judgement: Moderate   Modes of Intervention: Teaching Laboratory Technician   Patient Response to Interventions:  Receptive   Education Outcome:  In group clarification offered    Clinical Observations/Individualized Feedback: Pt was in group for a few minutes. Pt had good interaction with therapy dog team before leaving and not returning.     Plan: Continue to engage patient in RT group sessions 2-3x/week.   Roiza Wiedel-McCall, LRT,CTRS 05/10/2024 12:38 PM

## 2024-05-10 NOTE — Group Note (Signed)
 Date:  05/10/2024 Time:  5:24 PM  Group Topic/Focus:  Goals Group:   The focus of this group is to help patients establish daily goals to achieve during treatment and discuss how the patient can incorporate goal setting into their daily lives to aide in recovery. Orientation:   The focus of this group is to educate the patient on the purpose and policies of crisis stabilization and provide a format to answer questions about their admission.  The group details unit policies and expectations of patients while admitted.    Participation Level:  Did Not Attend   Kaitlyn Gray Mars 05/10/2024, 5:24 PM

## 2024-05-10 NOTE — Group Note (Signed)
 Date:  05/10/2024 Time:  12:00 PM  Group Topic/Focus:   Pet Therapy  Pt did attend pet therapy group  Grason Brailsford D Drury Ardizzone 05/10/2024, 12:00 PM

## 2024-05-11 LAB — GLUCOSE, CAPILLARY: Glucose-Capillary: 86 mg/dL (ref 70–99)

## 2024-05-11 MED ORDER — ARIPIPRAZOLE ER 400 MG IM SRER: 400.0000 mg | INTRAMUSCULAR | Status: DC

## 2024-05-11 MED ORDER — QUETIAPINE FUMARATE 50 MG PO TABS
50.0000 mg | ORAL_TABLET | Freq: Every day | ORAL | Status: DC
  Administered 2024-05-11: 50 mg via ORAL
  Filled 2024-05-11: qty 1

## 2024-05-11 MED ORDER — CLONIDINE HCL 0.1 MG PO TABS
0.1000 mg | ORAL_TABLET | Freq: Once | ORAL | Status: DC
  Filled 2024-05-11: qty 1

## 2024-05-11 NOTE — [facility] Suicide Risk Assessment (Signed)
 [facility] INPATIENT:  Family/Significant Other Suicide Prevention Education  Suicide Prevention Education:  Education Completed; Doyal Finder (aunt) [phone] ,  (name of family member/significant other) has been identified by the patient as the family member/significant other with whom the patient will be residing, and identified as the person(s) who will aid the patient in the event of a mental health crisis (suicidal ideations/suicide attempt).  With written consent from the patient, the family member/significant other has been provided the following suicide prevention education, prior to the and/or following the discharge of the patient.  The suicide prevention education provided includes the following: Suicide risk factors Suicide prevention and interventions National Suicide Hotline telephone number [facility] assessment telephone number [facility] Emergency Assistance [address] [facility] and/or Residential Mobile Crisis Unit telephone number  Request made of family/significant other to: Remove weapons (e.g., guns, rifles, knives), all items previously/currently identified as safety concern.   Remove drugs/medications (over-the-counter, prescriptions, illicit drugs), all items previously/currently identified as a safety concern.  The family member/significant other verbalizes understanding of the suicide prevention education information provided.  The family member/significant other agrees to remove the items of safety concern listed above.  Louetta Lame 05/11/2024, 3:13 PM

## 2024-05-11 NOTE — Progress Notes (Signed)
(  Sleep Hours) - 13  (Any PRNs that were needed, meds refused, or side effects to meds)-  Vistaril   (Any disturbances and when (visitation, over night)- None  (Concerns raised by the patient)-  Refused am Blood glucose check   (SI/HI/AVH)- AV[facility] denies SI/HI

## 2024-05-11 NOTE — Plan of Care (Signed)
  Problem: Education: Goal: Mental status will improve Outcome: Progressing   Problem: Activity: Goal: Interest or engagement in activities will improve Outcome: Progressing   Problem: Coping: Goal: Ability to verbalize frustrations and anger appropriately will improve Outcome: Progressing

## 2024-05-11 NOTE — [facility] Group Notes (Signed)
 Patient did not attend the Goals group.

## 2024-05-11 NOTE — Progress Notes (Signed)
   05/11/24 0900  Psych Admission Type (Psych Patients Only)  Admission Status Voluntary  Psychosocial Assessment  Patient Complaints Depression  Eye Contact Avoids;Brief  Facial Expression Flat  Affect Depressed  Speech Incoherent  Interaction Isolative  Motor Activity Unsteady  Appearance/Hygiene In scrubs  Behavior Characteristics Unable to participate  Mood Depressed  Aggressive Behavior  Targets Other (Comment) (na)  Type of Behavior Other (Comment) (na)  Effect No apparent injury  Thought Process  Coherency WDL  Content WDL  Delusions None reported or observed  Perception WDL  Hallucination None reported or observed  Judgment Poor  Confusion None  Danger to Self  Current suicidal ideation? Denies  Description of Suicide Plan denies  Self-Injurious Behavior No self-injurious ideation or behavior indicators observed or expressed   Agreement Not to Harm Self Yes  Description of Agreement Verbal  Danger to Others  Danger to Others None reported or observed

## 2024-05-11 NOTE — Progress Notes (Signed)
Pt did not attend NA group 

## 2024-05-11 NOTE — [facility] Group Notes (Signed)
 Patient did not attend the Recreation Therapy group.

## 2024-05-11 NOTE — Group Note (Signed)
 Recreation Therapy Group Note   Group Topic:Relaxation  Group Date: 05/11/2024 Start Time: 0940 End Time: 1024 Facilitators: Kama Cammarano-McCall, LRT,CTRS Location: [address] Dayroom   Group Topic: Music Therapy  Goal Area(s) Addresses:  Patient will identify positive affects of music. Patient will identify benefits of using music post d/c.  Behavioral Response:   Intervention: Youtube, Music  Activity : Patients were given the opportunity to choose songs on YouTube that have meaning to them. Patients would then explain the importance of their songs. Song selections had to be clean and appropriate for group.   Education:  Stress Management, Discharge Planning.   Education Outcome: Acknowledges Education   Affect/Mood: N/A   Participation Level: Did not attend    Clinical Observations/Individualized Feedback:      Plan: Continue to engage patient in RT group sessions 2-3x/week.   Ferris Tally-McCall, LRT,CTRS 05/11/2024 12:51 PM

## 2024-05-11 NOTE — Group Note (Signed)
 Date:  05/11/2024 Time:  3:51 PM  Group Topic/Focus: Sleep Hygiene Dimensions of Wellness:   The focus of this group is to introduce the topic of wellness and discuss the role each dimension of wellness plays in total health.    Participation Level:  Active  Participation Quality:  Appropriate  Affect:  Appropriate  Cognitive:  Appropriate  Insight: Appropriate  Engagement in Group:  Engaged  Modes of Intervention:  Discussion  Additional Comments:  Pt engaged appropriately during group.  Katheen Aslin D Rockland Kotarski 05/11/2024, 3:51 PM

## 2024-05-11 NOTE — Progress Notes (Signed)
 [facility] MD Progress Note  05/11/2024 9:38 AM Kaitlyn LARRIVEE  MRN:  [id number]  Principal Problem: Schizoaffective disorder, bipolar type (HCC) Diagnosis: Principal Problem:   Schizoaffective disorder, bipolar type (HCC) Active Problems:   Polysubstance abuse (HCC)  Total Time spent with patient:  I personally spent 35 minutes on the unit in direct patient care. The direct patient care time included face-to-face time with the patient, reviewing the patient's chart, communicating with other professionals, and coordinating care.    Identifying Information and past psychiatric history: Kaitlyn Gray is a 48 yr old female with a psychiatric history currently most consistent with SAD-BT, stimulant use disorder, severe, and hx of opioid use disorder admitted with command hallucinations and SI in the setting of medication non-compliance and substance abuse   Psychiatric history is significant for prior diagnoses of Schizoaffective Disorder, Anxiety, PTSD, and Polysubstance Abuse (Cocaine, Meth, THC, Hx of Heroin). She has numerous psychiatric admissions for a combination of mood and psychotic symptoms in the setting of heavy substance use. Last admitted earlier in November 2025. She has also endorsed multiple Suicide Attempts, Self Injurious Behavior.   Interval events: The patient was out of bed more in the afternoon and did attend some groups yesterday with good participation. She slept 13 hrs overnight and did not have any behavioral concerns yesterday - no PRNs were required. She did refuse blood glucose check this morning. Denying SI, hI and AVH. BP (!) 145/87 (BP Location: Left Arm) Comment: Rn aware  Pulse 73   Temp (!) 97.4 F (36.3 C)   Resp 18   Ht 5' 4 (1.626 m)   Wt 66 kg   LMP 05/06/2024   SpO2 100%   BMI 24.98 kg/m    Interview On interview today the patient states she is great because she is no longer hearing voices. States the last time she heard anything was  yesterday afternoon. She denies mood concerns, including depression or irritability. Patient does acknowledge, when asked, that she had used cocaine just prior to coming in. States she uses this sometimes when she hangs out with the wrong people. She is aware that it makes the voices worse and does think that the cocaine was part of the issue on this admission. She does not want rehab, however, stating that she does not use regularly and believes she can quit on her own. Her plan is to stay away from the wrong people and lean on her friends and family. She will go back to her own apartment, states she lives there alone. Patient states that she does have an Aunt, Kaitlyn Gray, that she agrees this chartered loss adjuster and staff could reach out to if needed. She is hopeful to leave the hospital by tomorrow as she wants to be able to attend thanksgiving with her family.  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) 07/19/2017   Hypertension  Hypertensive emergency 05/31/2022   Ischemic stroke (HCC) 01/01/2020   subacute right middle cerebellar peduncle and pons infarction   Left-sided weakness 01/27/2022   Head CT with remote right occipital infarct which is consistent with old left-sided weakness   MDD (major depressive disorder), recurrent severe, 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   Normocytic  anemia 02/09/2020   Opiate use    History of Suboxone  Therapy until 05/2021   Polysubstance abuse (HCC) 07/19/2017   Prescription drug misadventures (Seroquel )     Past Surgical History:  Procedure Laterality Date   CESAREAN SECTION     Family History:  Family History  Problem Relation Age of Onset   Hypertension Mother    CAD Mother 61       died of MI at age 45   Hypertension Father    Family Psychiatric  History:  Mother, Wylie, Higinio- Substance Abuse No Known Diagnosis' or Suicides  Social History:  Social History   Substance and Sexual Activity  Alcohol Use Not Currently     Social History   Substance and Sexual Activity  Drug Use Yes   Types: Marijuana, Cocaine, Crack cocaine   Comment: occassionally    Social History   Socioeconomic History   Marital status: Single    Spouse name: Not on file   Number of children: Not on file   Years of education: Not on file   Highest education level: Not on file  Occupational History   Not on file  Tobacco Use   Smoking status: Every Day    Current packs/day: 1.00    Average packs/day: 1 pack/day for 25.9 years (25.9 ttl pk-yrs)    Types: Cigarettes    Start date: 06/16/1998    Passive exposure: Current   Smokeless tobacco: Former  Building Services Engineer status: Every Day  Substance and Sexual Activity   Alcohol use: Not Currently   Drug use: Yes    Types: Marijuana, Cocaine, Crack cocaine    Comment: occassionally   Sexual activity: Never    Birth control/protection: None  Other Topics Concern   Not on file  Social History Narrative   ** Merged History Encounter **       Social Drivers of Health   Financial Resource Strain: Not on file  Food Insecurity: Food Insecurity Present (05/07/2024)   Hunger Vital Sign    Worried About Running Out of Food in the Last Year: Often true    Ran Out of Food in the Last Year: Often true  Transportation Needs: Unmet Transportation Needs (05/07/2024)   PRAPARE -  Administrator, Civil Service (Medical): Yes    Lack of Transportation (Non-Medical): Yes  Physical Activity: Not on file  Stress: Not on file  Social Connections: Not on file    Current Medications: Current Facility-Administered Medications  Medication Dose Route Frequency Provider Last Rate Last Admin   acetaminophen  (TYLENOL ) tablet 650 mg  650 mg Oral Q6H PRN Motley-Mangrum, Jadeka A, PMHNP   650 mg at 05/07/24 1550   alum & mag hydroxide-simeth (MAALOX/MYLANTA) 200-200-20 MG/5ML suspension 30 mL  30 mL Oral Q4H PRN Motley-Mangrum, Jadeka A, PMHNP       benztropine  (COGENTIN ) tablet 1 mg  1 mg Oral BID Motley-Mangrum, Jadeka A, PMHNP   1 mg at 05/10/24 1006   haloperidol  (HALDOL ) tablet 5 mg  5 mg Oral TID PRN Motley-Mangrum, Jadeka A, PMHNP   5 mg at 05/10/24 1630   And   diphenhydrAMINE  (BENADRYL )  capsule 50 mg  50 mg Oral TID PRN Motley-Mangrum, Jadeka A, PMHNP   50 mg at 05/10/24 1630   haloperidol  lactate (HALDOL ) injection 5 mg  5 mg Intramuscular TID PRN Motley-Mangrum, Jadeka A, PMHNP       And   diphenhydrAMINE  (BENADRYL ) injection 50 mg  50 mg Intramuscular TID PRN Motley-Mangrum, Jadeka A, PMHNP       And   LORazepam  (ATIVAN ) injection 2 mg  2 mg Intramuscular TID PRN Motley-Mangrum, Jadeka A, PMHNP       haloperidol  lactate (HALDOL ) injection 10 mg  10 mg Intramuscular TID PRN Motley-Mangrum, Jadeka A, PMHNP       And   diphenhydrAMINE  (BENADRYL ) injection 50 mg  50 mg Intramuscular TID PRN Motley-Mangrum, Jadeka A, PMHNP       And   LORazepam  (ATIVAN ) injection 2 mg  2 mg Intramuscular TID PRN Motley-Mangrum, Jadeka A, PMHNP       feeding supplement (ENSURE PLUS HIGH PROTEIN) liquid 237 mL  237 mL Oral BID BM Pashayan, Alexander S, DO   237 mL at 05/10/24 1333   hydrocerin (EUCERIN) cream   Topical BID Pashayan, Alexander S, DO   Given at 05/09/24 1011   hydrOXYzine  (ATARAX ) tablet 50 mg  50 mg Oral Q6H PRN Motley-Mangrum, Jadeka A, PMHNP   50 mg at 05/10/24  2051   insulin  aspart (novoLOG ) injection 0-15 Units  0-15 Units Subcutaneous TID WC Raliegh Marsa RAMAN, DO   2 Units at 05/08/24 9358   insulin  aspart (novoLOG ) injection 0-5 Units  0-5 Units Subcutaneous QHS Pashayan, Alexander S, DO       losartan  (COZAAR ) tablet 50 mg  50 mg Oral Daily Pashayan, Alexander S, DO   50 mg at 05/10/24 1006   magnesium  hydroxide (MILK OF MAGNESIA) suspension 30 mL  30 mL Oral Daily PRN Motley-Mangrum, Jadeka A, PMHNP       metFORMIN  (GLUCOPHAGE ) tablet 500 mg  500 mg Oral BID WC Motley-Mangrum, Jadeka A, PMHNP   500 mg at 05/10/24 1629   QUEtiapine  (SEROQUEL ) tablet 300 mg  300 mg Oral QHS Pashayan, Alexander S, DO   300 mg at 05/10/24 2051   QUEtiapine  (SEROQUEL ) tablet 50 mg  50 mg Oral Q1200 Pashayan, Alexander S, DO   50 mg at 05/10/24 1333    Lab Results:  Results for orders placed or performed during the hospital encounter of 05/07/24 (from the past 48 hours)  Glucose, capillary     Status: Abnormal   Collection Time: 05/09/24 11:48 AM  Result Value Ref Range   Glucose-Capillary 108 (H) 70 - 99 mg/dL    Comment: Glucose reference range applies only to samples taken after fasting for at least 8 hours.  Glucose, capillary     Status: None   Collection Time: 05/09/24  5:07 PM  Result Value Ref Range   Glucose-Capillary 80 70 - 99 mg/dL    Comment: Glucose reference range applies only to samples taken after fasting for at least 8 hours.  Glucose, capillary     Status: None   Collection Time: 05/09/24 11:56 PM  Result Value Ref Range   Glucose-Capillary 97 70 - 99 mg/dL    Comment: Glucose reference range applies only to samples taken after fasting for at least 8 hours.  Glucose, capillary     Status: None   Collection Time: 05/10/24  6:09 AM  Result Value Ref Range   Glucose-Capillary 80 70 - 99 mg/dL    Comment: Glucose reference  range applies only to samples taken after fasting for at least 8 hours.  Glucose, capillary     Status: None    Collection Time: 05/10/24 11:56 AM  Result Value Ref Range   Glucose-Capillary 79 70 - 99 mg/dL    Comment: Glucose reference range applies only to samples taken after fasting for at least 8 hours.  Glucose, capillary     Status: None   Collection Time: 05/10/24  4:41 PM  Result Value Ref Range   Glucose-Capillary 85 70 - 99 mg/dL    Comment: Glucose reference range applies only to samples taken after fasting for at least 8 hours.  Glucose, capillary     Status: Abnormal   Collection Time: 05/10/24  8:34 PM  Result Value Ref Range   Glucose-Capillary 130 (H) 70 - 99 mg/dL    Comment: Glucose reference range applies only to samples taken after fasting for at least 8 hours.   Comment 1 Notify RN    Comment 2 Document in Chart     Blood Alcohol level:  Lab Results  Component Value Date   [facility] <15 05/06/2024   ETH <15 04/24/2024    Metabolic Disorder Labs: Lab Results  Component Value Date   HGBA1C 5.9 (H) 04/27/2024   MPG 122.63 04/27/2024   MPG 108.28 10/23/2023   Lab Results  Component Value Date   PROLACTIN 20.7 12/23/2022   Lab Results  Component Value Date   CHOL 154 04/27/2024   TRIG 53 04/27/2024   HDL 59 04/27/2024   CHOLHDL 2.6 04/27/2024   VLDL 11 04/27/2024   LDLCALC 85 04/27/2024   LDLCALC 64 10/23/2023    Physical Findings:  Mental Status exam: Appearance: black female, fair hygiene and grooming, seen sitting with peers; ambulates with Gray to engage in interview Eye contact: limited  Attitude towards examiner polite, improved engagement today Psychomotor: no agitation or retardation  Speech: remains concrete and monotonous, but overall normal in rate, volume and mount Language: no overt delays on limited interview  Mood: great because I'm not hearing anything  Affect: congruent, neutral, remains somewhat restricted but more appropriate today Thought content: denies SI and HI, no delusions expressed Thought Process: concrete but grossly  linear/logical  Perception: denying AVH (last AH yesterday), not RTIS  Insight: fair to good - today able to verbalize diagnosis and substance use concerns contributing  Judgement: fair - now wanting to stay sober    Orientation: did not answer orientation questions  Attention/Concentration: limited - possibly due to disorganization, appears at least partly volitional  Memory/Cognition: not formally assessed   Fund of Knowledge: Average    Musculoskeletal: Strength & Muscle Tone: within normal limits Gait & Station: normal Patient leans: N/A   Physical Exam Vitals and nursing note reviewed.  Constitutional:      General: She is not in acute distress.    Appearance: Normal appearance. She is obese. She is not ill-appearing or toxic-appearing.  HENT:     Head: Normocephalic and atraumatic.  Pulmonary:     Effort: Pulmonary effort is normal.  Neurological:     Mental Status: She is alert.    Review of Systems  Respiratory:  Negative for cough and shortness of breath.   Cardiovascular:  Negative for chest pain.  Gastrointestinal:  Negative for abdominal pain, constipation, diarrhea, nausea and vomiting.  Neurological:  Negative for dizziness, weakness and headaches.  Psychiatric/Behavioral:  Positive for depression. Negative for hallucinations and suicidal ideas. The patient is not nervous/anxious.  Blood pressure (!) 145/87, pulse 73, temperature (!) 97.4 F (36.3 C), resp. rate 18, height 5' 4 (1.626 m), weight 66 kg, last menstrual period 05/06/2024, SpO2 100%. Body mass index is 24.98 kg/m.   Treatment Plan Summary: Daily contact with patient to assess and evaluate symptoms and progress in treatment and Medication management  Assessment/plan: Mycala Warshawsky is a 48 yr old female with a psychiatric history most consistent with schizoaffective disorder, bipolar type, severe stimulant use, and hx of opioid use disorder who presented on 11/21 to [facility] with chest pain,  command hallucinations, and SI in the setting of medication non-compliance and substance abuse. She was admitted to [facility] on 11/22 under IVC. Although substance use confounds her clinical presentation, she has had recurrent inpatient admissions with significant psychotic symtpoms including hallucinatiosn, delusions, bizarre behavior, disorganization in thought process concerning for a primary underlying psychotic disorder. Given reports of prior manic symptoms (reduced sleep, grandiosity, increased energy, lability in mood, etc.) SAD-BT has been the working diagnosis. She has continued to use stimulants - both cocaien and meth - heavily indicating an additional severe stimulant use disorder. She has previously used heroin, but unclear recent or current pattern of usage.     Yesterday, the patient was irritable and largely disengaged in interview. Was restricted in her affect, and although interview was limited, mumbling and poor attention. Not overtly RTIS, but he prior evening she did require PRNs due to hallucinations. Noted that since she did recently receive LAI (on 11/10) and had used cocaine prior to hospitalization, no additional medicine changes were indicated.   Today the patient was more alert and better engaged in interview. Denying all concerns and reporting last hallucination was yesterday. Good insight into the fact that recent cocaine use worsened her hallucinations and wanting to stay sober, although not wanting rehab. Patient is likely at or nearing baseline. This dino attempted to call aunt (as patient gave verbal permission) for safety planning and collateral but was unable to connect today.      Schizoaffective Disorder, Bipolar Type: -Continue Seroquel  300 mg QHS for mood stability and psychosis (last increased 11/24)  -will discontinue afternoon dose of 50 at this time given improvements and to simplify regimen.  -Received Abilify  Maintena 400 mg on 11/10  -next due  05/23/24 -Continue Agitation Protocol: Haldol /Ativan /Benadryl      Polysubstance Abuse: -Residential Rehab referral      HTN: -Continue Losartan  to 50 mg daily     Diabetes: -Continue Sliding Scale -Continue Metformin  500 mg BID     -Continue Ensure 237 mL BID -Continue Eucerin cream for Eczema  -Continue PRN's: Tylenol , Maalox, Atarax , Milk of Magnesia, Trazodone     --  The risks/benefits/side-effects/alternatives to medications were discussed in detail with the patient and time was given for questions. The patient consents to medication trials.                -- Metabolic profile and EKG monitoring obtained while on an atypical antipsychotic (BMI: 24.98 Lipid Panel: WNL, HbgA1c: 5.9, EKG: Sinus Rhythm w/ marked sinus Arrhythmia w/QTc: 426)              -- Encouraged patient to participate in unit milieu and in scheduled group therapies              -- Short Term Goals: Ability to identify changes in lifestyle to reduce recurrence of condition will improve, Ability to verbalize feelings will improve, Ability to disclose and discuss suicidal ideas, Ability to demonstrate self-control  will improve, Ability to identify and develop effective coping behaviors will improve, Ability to maintain clinical measurements within normal limits will improve, Compliance with prescribed medications will improve, and Ability to identify triggers associated with substance abuse/mental health issues will improve             -- Long Term Goals: Improvement in symptoms so as ready for discharge   Safety and Monitoring:             -- Voluntary admission to inpatient psychiatric unit for safety, stabilization and treatment             -- Daily contact with patient to assess and evaluate symptoms and progress in treatment             -- Patient's case to be discussed in multi-disciplinary team meeting             -- Observation Level : q15 minute checks             -- Vital signs:  q12 hours              -- Precautions: suicide, elopement, and assault  Discharge Planning:              -- Social work and case management to assist with discharge planning and identification of hospital follow-up needs prior to discharge             -- Estimated LOS: 3-5 more days             -- Discharge Concerns: Need to establish a safety plan; Medication compliance and effectiveness             -- Discharge Goals: Return home with outpatient referrals for mental health follow-up including medication management/psychotherapy   Leita LOISE Arts, MD 05/11/2024, 9:38 AM

## 2024-05-12 LAB — GLUCOSE, CAPILLARY: Glucose-Capillary: 95 mg/dL (ref 70–99)

## 2024-05-12 MED ORDER — HYDROXYZINE HCL 50 MG PO TABS: 50.0000 mg | ORAL_TABLET | Freq: Four times a day (QID) | ORAL | 0 refills | Status: AC | PRN

## 2024-05-12 MED ORDER — QUETIAPINE FUMARATE 50 MG PO TABS: 50.0000 mg | ORAL_TABLET | Freq: Every day | ORAL | 0 refills | Status: AC

## 2024-05-12 MED ORDER — LOSARTAN POTASSIUM 50 MG PO TABS: 50.0000 mg | ORAL_TABLET | Freq: Every day | ORAL | 0 refills | Status: AC

## 2024-05-12 MED ORDER — QUETIAPINE FUMARATE 300 MG PO TABS: 300.0000 mg | ORAL_TABLET | Freq: Every day | ORAL | 0 refills | Status: AC

## 2024-05-12 MED ORDER — ARIPIPRAZOLE ER 400 MG IM SRER
400.0000 mg | INTRAMUSCULAR | 0 refills | Status: AC
Start: 2024-05-23 — End: 2024-05-24

## 2024-05-12 MED ORDER — METFORMIN HCL 500 MG PO TABS
500.0000 mg | ORAL_TABLET | Freq: Two times a day (BID) | ORAL | 0 refills | Status: AC
Start: 2024-05-12 — End: 2024-06-11

## 2024-05-12 NOTE — BHH Suicide Risk Assessment (Signed)
 [facility] Discharge Suicide Risk Assessment   Principal Problem: Schizoaffective disorder, bipolar type [facility]) Discharge Diagnoses: Principal Problem:   Schizoaffective disorder, bipolar type (HCC) Active Problems:   Polysubstance abuse (HCC)    Suicide Risk:  The patient presented with acute risk factors for suicide including altered mental status/psychosis, SI and recent substance use (cocaine). She carries additional chronic risk factors of prior inpatient admissions, previous suicide attempts, unstable housing and lack of resources. To address acute factors she was admitted voluntarily to inpatient psychiatry and restarted on home medications with resolution of psychotic symptoms (voices) and SI. Additional protective factors include history of presenting to the ED when in crisis, family support, future orientation (wanting to leave today to make thanksgiving with her family, wanting assistance with disability to obtain better housing which is inconsistent with a desire to die). Currently is largely linear/organized, denying depression and SI and voicing intent to maintain sobriety outside of the hospital. Current and short-term risk of suicide is deemed low.  At this time patient is deemed at baseline. Outpatient follow up is the most appropriate and least restrictive environment and will be discharging home today    Follow-up Information     [facility] Follow up on 05/19/2024.   Why: You have a hospital follow up appointment for therapy and medication management services on 05/19/24 @ 2:30 pm.  Appointments are Virtual. Contact information: [address]  [address] [place] [address] [phone]                  Leita LOISE Arts, MD 05/12/2024, 7:56 AM

## 2024-05-12 NOTE — Progress Notes (Signed)
D: Pt A & O X 4. Denies SI, HI, AVH and pain at this time. D/C home as ordered. Picked up in front of facility by [employer].  A: D/C instructions reviewed with pt including prescriptions, medication samples and follow up appointment, compliance encouraged. All belongings from assigned locker returned to pt at time of departure. Scheduled medications given with verbal education and effects monitored. Safety checks maintained without incident till time of d/c.  R: Pt receptive to care. Compliant with medications when offered. Denies adverse drug reactions when assessed. Verbalized understanding related to d/c instructions. Signed belonging sheet in agreement with items received from locker. Ambulatory with a steady gait. Appears to be in no physical distress at time of departure.

## 2024-05-12 NOTE — Group Note (Signed)
 Date:  05/12/2024 Time:  9:52 AM  Group Topic/Focus: physical wellness orientation goals group Dimensions of Wellness:   The focus of this group is to introduce the topic of wellness and discuss the role each dimension of wellness plays in total health. Orientation:   The focus of this group is to educate the patient on the purpose and policies of crisis stabilization and provide a format to answer questions about their admission.  The group details unit policies and expectations of patients while admitted. Wellness Toolbox:   The focus of this group is to discuss various aspects of wellness, balancing those aspects and exploring ways to increase the ability to experience wellness.  Patients will create a wellness toolbox for use upon discharge.    Participation Level:  Minimal  Participation Quality:  Appropriate  Affect:  Appropriate  Cognitive:  Alert  Insight: Appropriate  Engagement in Group:  Engaged  Modes of Intervention:  Socialization  Additional Comments:  Patient came in towards the end of group  Jonty Morrical M Giabella Duhart 05/12/2024, 9:52 AM

## 2024-05-12 NOTE — Transportation (Signed)
 05/12/2024  Kaitlyn Gray DOB: 03-19-1976 MRN: [id number]   RIDER WAIVER AND RELEASE OF LIABILITY  For the purposes of helping with transportation needs, [facility] partners with outside transportation providers (taxi companies, [place], catering manager.) to give [facility] patients or other approved people the choice of on-demand rides Public Librarian) to our buildings for non-emergency visits.  By using [employer], I, the person signing this document, on behalf of myself and/or any legal minors (in my care using the [employer]), agree:  Science Writer given to me are supplied by independent, outside transportation providers who do not work for, or have any affiliation with, [employer]. [place] is not a transportation company. [facility] has no control over the quality or safety of the rides I get using [employer]. [place] has no control over whether any outside ride will happen on time or not. [facility] gives no guarantee on the reliability, quality, safety, or availability on any rides, or that no mistakes will happen. I know and accept that traveling by vehicle (car, truck, SVU, fleeta, bus, taxi, etc.) has risks of serious injuries such as disability, being paralyzed, and death. I know and agree the risk of using [employer] is mine alone, and not [employer]. [employer] are provided as is and as are available. The transportation providers are in charge for all inspections and care of the vehicles used to provide these rides. I agree not to take legal action against [place], its agents, employees, officers, directors, representatives, insurers, attorneys, assigns, successors, subsidiaries, and affiliates at any time for any reasons related directly or indirectly to using [employer]. I also agree not to take legal action against [place] or its affiliates for any injury, death, or damage to property caused by or related to using  [employer]. I have read this Waiver and Release of Liability, and I understand the terms used in it and their legal meaning. This Waiver is freely and voluntarily given with the understanding that my right (or any legal minors) to legal action against [place] relating to [employer] is knowingly given up to use these services.   I attest that I read the Ride Waiver and Release of Liability to Kaitlyn Gray, gave Ms. Olsson the opportunity to ask questions and answered the questions asked (if any). I affirm that Kaitlyn Gray then provided consent for assistance with transportation.

## 2024-05-12 NOTE — Progress Notes (Signed)
(  Sleep Hours) - 8.5 (Any PRNs that were needed, meds refused, or side effects to meds)- None (Any disturbances and when (visitation, over night)- None (Concerns raised by the patient)- None (SI/HI/AVH)--SI/HI, +AVH.  Auditory hallucinations sounds like people she knows.  Sometimes it sound like her mother's voice who is deceased.

## 2024-05-12 NOTE — Discharge Summary (Signed)
 Physician Discharge Summary Note  Patient:  Kaitlyn Gray is an 48 y.o., female MRN:  [id number] DOB:  [birth year] Patient phone:  [phone] (home)  Patient address:   [address] [place] [address],  Total Time spent with patient: 35 minutes   Date of Admission:  05/07/2024 Date of Discharge: 05/12/24  Reason for Admission:  SI, hallucinations   Principal Problem: Schizoaffective disorder, bipolar type [name]) Discharge Diagnoses: Principal Problem:   Schizoaffective disorder, bipolar type (HCC) Active Problems:   Polysubstance abuse (HCC)   Identifying Information and past psychiatric history: Kaitlyn Gray is a 48 yr old female with a psychiatric history currently most consistent with SAD-BT, stimulant use disorder, severe, and hx of opioid use disorder who was admitted with command hallucinations and SI in the setting of substance use (cocaine).    Psychiatric history is significant for prior diagnoses of Schizoaffective Disorder, Anxiety, PTSD, and Polysubstance Abuse (Cocaine, Meth, THC, Hx of Heroin). She has numerous psychiatric admissions for a combination of mood and psychotic symptoms in the setting of heavy substance use. Last admitted earlier in November 2025 and given abilify  maintenna 400 mg IM at hat time. She has also endorsed multiple Suicide Attempts, Self Injurious Behavior.   Hospital Course: The patient initially presented to [facility] on 05/06/24 endorsing chest pain and command AH as well as vague SI. EKG was non-concerning and with Qtc of 426. UDS positive for cocaine and THC. Additional labs grossly unremarkable. She was admitted voluntarily to [facility] to address mood and psychotic symptoms, admitted 11/22.  Upon arrival, patient was voicing ongoing depressive symptoms and AH after smoking cocaine. Patient was understanding that voices tend to worsen with substance use. As patient had recently received her LAI, there was High suspicion for  breakthrough psychotic symptoms arising from substance use. However, she did run out of home Seroquel . Patient was restarted on Seroquel , titrated to a total of 50 mg in the afternoon + 300 mg at bedtime during this hospitalization. She had received Abilify  Maintenna 400 mg IM on 04/25/24 and is next due for this medication on 05/23/24. No changes were made to this. During admission Lozartan increased to 50 mg daily to address significantly high pressures with improvements.   By 11/25 the patient was no longer endorsing depression or SI, and no longer required any PRNs for behaviors or hallucinations. She remained irritable and largely disengaged in interview. Mumbling and with some mild disorganization in TP and was continued on medications unchanged with plan to continue to monitor while substances fully cleared. By 11/26  she was more alert and better engaged in interview. Denying all concerns and reporting last hallucination was the day prior. Good insight into the fact that recent cocaine use worsened her hallucinations and wanting to stay sober, although not wanting rehab. Patient was hopeful that she would be able to leave the hospital in the morning to coordinate thanksgiving with family. Safety planning able to be done with aunt that afternoon via SW.  Interview today: Today the patient reports she is good. States she has not had any voices come back over the past several days. She is not feeling depressed and denies any suicidal thoughts. Overall, feeling much better and noting good sleep, good energy, and good appetite. Voiced ongoing concerns about her disability and intent to follow up at [facility] location for further assistance with this. She was provided aunt's phone number to call while in the hospital to help coordinate thanksgiving plans today. Patient was  looking forward to seeing family. She had no additional questions or concerns.  Behavior on unit/overall: Patient was initially endorsing  depression, SI and auditory hallucinations. Over the first 3 days of admission she was notably mood labile and with frequent reports of distressing hallucinations requiring intermittent PO PRNs. Last PRN was on 11/24. By the last 2-3 days of admission, the patient was brighter in affect, reporting resolution of SI and and full resolution of break-through auditory hallucinations by 11/26. She was medication compliant throughout admission and did attend groups. Patient was encouraged to consider inpatient rehab or outpatient substance programming but she declined, voicing intent to stay away from friends that use cocaine and maintain sobriety on her own. The patient was deemed low risk of harm to self/others and requesting discharge today. As she did not meet criteria for further involuntary admission she was ultimately discharged back home with safe transport and with medications in hand via paper scripts and follow up as documented below. Advised to call 911, [phone] or return to the ED should SI or hallucinations recur. Voiced understanding and agreement. All questions answered. Patient was discharged in stable and improved condition.    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) 07/19/2017   Hypertension    Hypertensive emergency 05/31/2022   Ischemic stroke (HCC) 01/01/2020   subacute right middle cerebellar peduncle and pons infarction   Left-sided weakness  01/27/2022   Head CT with remote right occipital infarct which is consistent with old left-sided weakness   MDD (major depressive disorder), recurrent severe, 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   Normocytic anemia 02/09/2020   Opiate use    History of Suboxone  Therapy until 05/2021   Polysubstance abuse (HCC) 07/19/2017   Prescription drug misadventures (Seroquel )     Past Surgical History:  Procedure Laterality Date   CESAREAN SECTION     Family History:  Family History  Problem Relation Age of Onset   Hypertension Mother    CAD Mother 55       died of MI at age 63   Hypertension Father    Social History:  Social History   Substance and Sexual Activity  Alcohol Use Not Currently     Social History   Substance and Sexual Activity  Drug Use Yes   Types: Marijuana, Cocaine, Crack cocaine   Comment: occassionally    Social History   Socioeconomic History   Marital status: Single    Spouse name: Not on file   Number of children: Not on file   Years of education: Not on file   Highest education level: Not on file  Occupational History   Not on file  Tobacco Use   Smoking status: Every Day    Current packs/day: 1.00    Average packs/day: 1 pack/day for 25.9 years (25.9 ttl pk-yrs)    Types: Cigarettes    Start date: 06/16/1998    Passive exposure: Current   Smokeless tobacco: Former  Psychologist, Educational  Use   Vaping status: Every Day  Substance and Sexual Activity   Alcohol use: Not Currently   Drug use: Yes    Types: Marijuana, Cocaine, Crack cocaine    Comment: occassionally   Sexual activity: Never    Birth control/protection: None  Other Topics Concern   Not on file  Social History Narrative   ** Merged History Encounter **       Social Drivers of Health   Financial Resource Strain: Not on file  Food Insecurity: Food Insecurity Present (05/07/2024)   Hunger Vital Sign    Worried About Running Out of Food in the Last Year: Often true    Ran Out of Food in  the Last Year: Often true  Transportation Needs: Unmet Transportation Needs (05/07/2024)   PRAPARE - Administrator, Civil Service (Medical): Yes    Lack of Transportation (Non-Medical): Yes  Physical Activity: Not on file  Stress: Not on file  Social Connections: Not on file    Physical Findings:  Mental Status exam: Appearance: black female, fair hygiene and grooming, seen laying in bed, sits up for interview  Eye contact: good  Attitude towards examiner polite, improved engagement today Psychomotor: no agitation or retardation  Speech: remains concrete and monotonous, but overall normal in rate, volume and amount Language: simplistic, no delays Mood: good  Affect: congruent, neutral, appropriate  Thought content: denies SI and HI, no delusions expressed Thought Process: concrete but grossly linear/logical  Perception: denying AVH, not RTIS  Insight: fair to good - full understanding of history and how substances worsen symptoms Judgement: fair - now wanting to stay sober     Orientation: x4 Attention/Concentration: good - improved   Memory/Cognition: not formally assessed, grossly intact    Fund of Knowledge: Average to below-average based on conversation     Musculoskeletal: Strength & Muscle Tone: within normal limits Gait & Station: ambulates slowly and with walker Patient leans: N/A  Physical Exam Constitutional:      Appearance: Normal appearance.  HENT:     Head: Normocephalic and atraumatic.  Pulmonary:     Effort: Pulmonary effort is normal.  Musculoskeletal:     Cervical back: Normal range of motion.  Neurological:     Mental Status: She is alert.     Gait: Gait abnormal.    ROS Blood pressure (!) 174/94, pulse 86, temperature 98.2 F (36.8 C), temperature source Oral, resp. rate 18, height 5' 4 (1.626 m), weight 66 kg, last menstrual period 05/06/2024, SpO2 95%. Body mass index is 24.98 kg/m.   Social History   Tobacco Use   Smoking Status Every Day   Current packs/day: 1.00   Average packs/day: 1 pack/day for 25.9 years (25.9 ttl pk-yrs)   Types: Cigarettes   Start date: 06/16/1998   Passive exposure: Current  Smokeless Tobacco Former   Tobacco Cessation:  N/A, patient does not currently use tobacco products   Blood Alcohol level:  Lab Results  Component Value Date   [facility] <15 05/06/2024   ETH <15 04/24/2024    Metabolic Disorder Labs:  Lab Results  Component Value Date   HGBA1C 5.9 (H) 04/27/2024   MPG 122.63 04/27/2024   MPG 108.28 10/23/2023   Lab Results  Component Value Date   PROLACTIN 20.7 12/23/2022   Lab Results  Component Value Date   CHOL 154 04/27/2024   TRIG 53 04/27/2024   HDL 59 04/27/2024   CHOLHDL 2.6 04/27/2024   VLDL 11 04/27/2024  LDLCALC 85 04/27/2024   LDLCALC 64 10/23/2023    See Psychiatric Specialty Exam and Suicide Risk Assessment completed by Attending Physician prior to discharge.  Discharge destination:  Home via safe transport   Is patient on multiple antipsychotic therapies at discharge:  Yes,   Do you recommend tapering to monotherapy for antipsychotics?  No   Has Patient had three or more failed trials of antipsychotic monotherapy by history:  Yes,   Antipsychotic medications that previously failed include:   1.  Seroquel . and 2.  Abilify /Abilify  maintenna.  Recommended Plan for Multiple Antipsychotic Therapies: Continue Abilify  maintenna LAI as primary medication - already at max dose and does intermittently have break through symptoms. Supplemental Seroquel  to be continued to further stabilize mood and prevent breakthrough AH. Could be tapered and discontinued in the future if she remains fully sober from stimulants, which appear to be worsening symptoms.  Allergies as of 05/12/2024       Reactions   Tomato Anaphylaxis, Other (See Comments)   Pt reports this as an allergy, but has been eating ketchup and pizza on previous visits w/o any s/s of  allergic reaction/anaphylaxis.    Hydrocodone  Itching   Latex Itching, Rash        Medication List     STOP taking these medications    benztropine  1 MG tablet Commonly known as: COGENTIN        TAKE these medications      Indication  ARIPiprazole  ER 400 MG Srer injection Commonly known as: ABILIFY  MAINTENA Inject 2 mLs (400 mg total) into the muscle every 28 (twenty-eight) days for 1 dose. Start taking on: May 23, 2024  Indication: Schizophrenia   hydrOXYzine  50 MG tablet Commonly known as: ATARAX  Take 1 tablet (50 mg total) by mouth every 6 (six) hours as needed for anxiety. What changed: when to take this  Indication: Feeling Anxious   losartan  50 MG tablet Commonly known as: COZAAR  Take 1 tablet (50 mg total) by mouth daily. What changed:  medication strength how much to take additional instructions  Indication: High Blood Pressure   metFORMIN  500 MG tablet Commonly known as: GLUCOPHAGE  Take 1 tablet (500 mg total) by mouth 2 (two) times daily with a meal. What changed: Another medication with the same name was removed. Continue taking this medication, and follow the directions you see here.  Indication: Body Weight Gain due to Antipsychotic Medication Use   QUEtiapine  50 MG tablet Commonly known as: SEROQUEL  Take 1 tablet (50 mg total) by mouth daily at 2 PM. What changed:  medication strength how much to take when to take this additional instructions  Indication: Manic-Depression   QUEtiapine  300 MG tablet Commonly known as: SEROQUEL  Take 1 tablet (300 mg total) by mouth at bedtime. What changed:  medication strength how much to take when to take this additional instructions  Indication: Manic-Depression        Follow-up Information     [facility] Follow up on 05/19/2024.   Why: You have a hospital follow up appointment for therapy and medication management services on 05/19/24 @ 2:30 pm.  Appointments are Virtual. Contact  information: [address]  [address] [place] [address] [phone]                  Signed: Leita LOISE Arts, MD 05/12/2024, 8:43 AM

## 2024-05-12 NOTE — Progress Notes (Signed)
  [facility] Adult Case Management Discharge Plan :  Will you be returning to the same living situation after discharge:  Yes,  patient will be returning to her home, address on file.  At discharge, do you have transportation home?: No. CSW arranged transportation for 10 am.  Do you have the ability to pay for your medications: Yes,  patient has active health insurance.   Release of information consent forms completed and in the chart;  Patient's signature needed at discharge.  Patient to Follow up at:  Follow-up Information     [facility] Follow up on 05/19/2024.   Why: You have a hospital follow up appointment for therapy and medication management services on 05/19/24 @ 2:30 pm.  Appointments are Virtual. Contact information: [address]  [address] [place] [address] [phone]                 Next level of care provider has access to [facility] Link:no  Safety Planning and Suicide Prevention discussed: Yes,  completed with Doyal Finder (aunt) [phone].      Has patient been referred to the Quitline?: Patient does not use tobacco/nicotine  products  Patient has been referred for addiction treatment: Patient refused referral for treatment.  Louetta Lame, LCSWA 05/12/2024, 8:41 AM

## 2024-05-13 ENCOUNTER — Other Ambulatory Visit: Payer: Self-pay

## 2024-05-13 ENCOUNTER — Emergency Department (HOSPITAL_COMMUNITY): Payer: MEDICAID

## 2024-05-13 ENCOUNTER — Encounter (HOSPITAL_COMMUNITY): Payer: Self-pay

## 2024-05-13 ENCOUNTER — Emergency Department (HOSPITAL_COMMUNITY)
Admission: EM | Admit: 2024-05-13 | Discharge: 2024-05-13 | Disposition: A | Discharge status: Home / Self Care | Payer: MEDICAID | Attending: Emergency Medicine | Admitting: Emergency Medicine

## 2024-05-13 ENCOUNTER — Other Ambulatory Visit (HOSPITAL_COMMUNITY): Payer: Self-pay

## 2024-05-13 DIAGNOSIS — J45909 Unspecified asthma, uncomplicated: Secondary | ICD-10-CM | POA: Diagnosis not present

## 2024-05-13 DIAGNOSIS — R0789 Other chest pain: Secondary | ICD-10-CM | POA: Insufficient documentation

## 2024-05-13 DIAGNOSIS — I10 Essential (primary) hypertension: Secondary | ICD-10-CM | POA: Diagnosis not present

## 2024-05-13 DIAGNOSIS — Z5900 Homelessness unspecified: Secondary | ICD-10-CM | POA: Insufficient documentation

## 2024-05-13 DIAGNOSIS — K859 Acute pancreatitis without necrosis or infection, unspecified: Secondary | ICD-10-CM | POA: Insufficient documentation

## 2024-05-13 DIAGNOSIS — Z91040 Latex allergy status: Secondary | ICD-10-CM | POA: Insufficient documentation

## 2024-05-13 DIAGNOSIS — F1910 Other psychoactive substance abuse, uncomplicated: Secondary | ICD-10-CM | POA: Insufficient documentation

## 2024-05-13 DIAGNOSIS — D649 Anemia, unspecified: Secondary | ICD-10-CM | POA: Diagnosis not present

## 2024-05-13 DIAGNOSIS — F17210 Nicotine dependence, cigarettes, uncomplicated: Secondary | ICD-10-CM | POA: Insufficient documentation

## 2024-05-13 DIAGNOSIS — Z79899 Other long term (current) drug therapy: Secondary | ICD-10-CM | POA: Diagnosis not present

## 2024-05-13 DIAGNOSIS — Z91148 Patient's other noncompliance with medication regimen for other reason: Secondary | ICD-10-CM | POA: Diagnosis not present

## 2024-05-13 DIAGNOSIS — Z8673 Personal history of transient ischemic attack (TIA), and cerebral infarction without residual deficits: Secondary | ICD-10-CM | POA: Diagnosis not present

## 2024-05-13 DIAGNOSIS — R1013 Epigastric pain: Secondary | ICD-10-CM

## 2024-05-13 LAB — RESP PANEL BY RT-PCR (RSV, FLU A&B, COVID)  RVPGX2
Influenza A by PCR: NEGATIVE
Influenza B by PCR: NEGATIVE
Resp Syncytial Virus by PCR: NEGATIVE
SARS Coronavirus 2 by RT PCR: NEGATIVE

## 2024-05-13 LAB — CBC WITH DIFFERENTIAL/PLATELET
Abs Immature Granulocytes: 0.01 K/uL (ref 0.00–0.07)
Basophils Absolute: 0.1 K/uL (ref 0.0–0.1)
Basophils Relative: 1 %
Eosinophils Absolute: 0.3 K/uL (ref 0.0–0.5)
Eosinophils Relative: 5 %
HCT: 28.9 % — ABNORMAL LOW (ref 36.0–46.0)
Hemoglobin: 8.7 g/dL — ABNORMAL LOW (ref 12.0–15.0)
Immature Granulocytes: 0 %
Lymphocytes Relative: 23 %
Lymphs Abs: 1.4 K/uL (ref 0.7–4.0)
MCH: 23.7 pg — ABNORMAL LOW (ref 26.0–34.0)
MCHC: 30.1 g/dL (ref 30.0–36.0)
MCV: 78.7 fL — ABNORMAL LOW (ref 80.0–100.0)
Monocytes Absolute: 0.5 K/uL (ref 0.1–1.0)
Monocytes Relative: 8 %
Neutro Abs: 4 K/uL (ref 1.7–7.7)
Neutrophils Relative %: 63 %
Platelets: 334 K/uL (ref 150–400)
RBC: 3.67 MIL/uL — ABNORMAL LOW (ref 3.87–5.11)
RDW: 20.9 % — ABNORMAL HIGH (ref 11.5–15.5)
WBC: 6.3 K/uL (ref 4.0–10.5)
nRBC: 0 % (ref 0.0–0.2)

## 2024-05-13 LAB — COMPREHENSIVE METABOLIC PANEL WITH GFR
ALT: 18 U/L (ref 0–44)
AST: 23 U/L (ref 15–41)
Albumin: 3.3 g/dL — ABNORMAL LOW (ref 3.5–5.0)
Alkaline Phosphatase: 49 U/L (ref 38–126)
Anion gap: 8 (ref 5–15)
BUN: 16 mg/dL (ref 6–20)
CO2: 25 mmol/L (ref 22–32)
Calcium: 8.7 mg/dL — ABNORMAL LOW (ref 8.9–10.3)
Chloride: 106 mmol/L (ref 98–111)
Creatinine, Ser: 0.82 mg/dL (ref 0.44–1.00)
GFR, Estimated: >60 mL/min (ref >60)
Glucose, Bld: 121 mg/dL — ABNORMAL HIGH (ref 70–99)
Potassium: 3.9 mmol/L (ref 3.5–5.1)
Sodium: 139 mmol/L (ref 135–145)
Total Bilirubin: 0.5 mg/dL (ref 0.0–1.2)
Total Protein: 6.7 g/dL (ref 6.5–8.1)

## 2024-05-13 LAB — LIPASE, BLOOD: Lipase: 104 U/L — ABNORMAL HIGH (ref 11–51)

## 2024-05-13 LAB — TROPONIN I (HIGH SENSITIVITY): Troponin I (High Sensitivity): 6 ng/L (ref <18)

## 2024-05-13 MED ORDER — SODIUM CHLORIDE 0.9 % IV BOLUS
1000.0000 mL | Freq: Once | INTRAVENOUS | Status: AC
  Administered 2024-05-13: 1000 mL via INTRAVENOUS

## 2024-05-13 MED ORDER — ACETAMINOPHEN 500 MG PO TABS
1000.0000 mg | ORAL_TABLET | Freq: Once | ORAL | Status: AC
  Administered 2024-05-13: 1000 mg via ORAL
  Filled 2024-05-13: qty 2

## 2024-05-13 MED ORDER — SUCRALFATE 1 G PO TABS
1.0000 g | ORAL_TABLET | Freq: Three times a day (TID) | ORAL | 0 refills | Status: AC
  Filled 2024-05-13: qty 21, 7d supply, fill #0

## 2024-05-13 MED ORDER — ALUM & MAG HYDROXIDE-SIMETH 200-200-20 MG/5ML PO SUSP
30.0000 mL | Freq: Once | ORAL | Status: AC
  Administered 2024-05-13: 30 mL via ORAL
  Filled 2024-05-13: qty 30

## 2024-05-13 MED ORDER — PANTOPRAZOLE SODIUM 40 MG PO TBEC
40.0000 mg | DELAYED_RELEASE_TABLET | Freq: Every day | ORAL | 0 refills | Status: AC
  Filled 2024-05-13: qty 14, 14d supply, fill #0

## 2024-05-13 NOTE — Discharge Instructions (Addendum)
 It was a pleasure caring for you today in the emergency department.  Be sure to follow a bland diet, avoid alcohol, NSAIDs, illicit drugs.  Take medications as prescribed. Please follow up with PCP for re check of your hemoglobin.  Please call gastroenterology for appointment  Please return to the emergency department for any worsening or worrisome symptoms.     RESOURCE GUIDE  Chronic Pain Problems: Contact [facility]  [phone] Patients need to be referred by their primary care doctor.  Insufficient Money for Medicine: Contact [employer]:  call [phone]  No Primary Care Doctor: Call [facility]  [phone] - can help you locate a primary care doctor that  accepts your insurance, provides certain services, etc. Physician Referral Service- [phone]  Agencies that provide inexpensive medical care: [name]  [phone] [facility]  [phone] [facility]  [phone] [facility]  [phone] [facility]  [phone] [facility]  [phone]  Medicaid-accepting [facility] Providers: [facility]- [address], [address]  [phone], Mon-Fri 9am-7pm, Sat 9am-1pm [facility]- [address] [place], [address] [place]  [phone] [facility]- [address] [place]  [phone] [facility]- [address]  [phone] [facility]- [address] [place], [address], [phone]  Only accepts [place] patients after they have their name  applied to their card  Self Pay (no insurance) in [facility]: Sickle Cell Patients - [facility]  [address] [place], [phone] [facility]- [address] [place]  [phone]       [place] [name]- [address]       -     [name]- see information above (Speak to [employer] if you do not have  insurance)       -  [facility]- [address],  [phone]       -  [facility]- [address] [phone]       -  [facility]-  [address], [place], [phone]       -  Urgent Medical and [facility] - [address], [phone]       -  [facility]- [address] [phone], also [address], [phone]       -     [facility]- [address] [place], [phone], 1st & 3rd Saturday         every month, 10am-1pm  -     Community [facility] and [facility]   [address] [place], [place].   Phone:  [phone], Fax:  [phone]. Hours of Operation:  9 am - 6 pm, M-F.  -     [facility]   [address], [place]   Phone: [phone], Fax: [phone]. Hours of Operation:  8:30 am - 5:30 pm, M-F.    Dental Assistance If unable to pay or uninsured, contact:  [facility]. to become qualified for the adult dental clinic.  Patients with Medicaid: [facility] [phone] [address], [phone] [address], [phone]  If unable to pay, or uninsured, contact [facility] [phone] in [place], [phone] in [place]) to become qualified for the  adult dental clinic  [facility] [address] [place], [place] [address] [phone] [email or web address]  Other Proofreader Services: [facility]- [address] [place], [place], [place], [address], [phone], Ext. [phone], 2nd and 4th Thursday of the month at 6:30am.  10 clients each day by appointment, can sometimes see walk-in patients if someone does not show for an appointment. [facility]- [address] [place], [place], [address], [phone] [facility] [address] [place], [place], [address], [phone] [facility] Health Department- [phone] [facility] Health Department- [phone] [facility] [phone]

## 2024-05-13 NOTE — ED Provider Notes (Signed)
 [place] EMERGENCY DEPARTMENT AT [facility] Provider Note  CSN: [id number] Arrival date & time: 05/13/24 9065  Chief Complaint(s) Chest Pain  HPI Kaitlyn Gray is a 48 y.o. female with past medical history as below, significant for cannabis use, bipolar 1 disorder, adjustment disorder, cocaine abuse, hypertension, ischemic stroke, MDD, prescription drug misadventures who presents to the ED with complaint of chest pain  Presented on 11/21 with complaint of chest pain along with headache and suicidal ideation.  Report of chest discomfort congestion cough for a month, malaise.  Workup at that time was reassuring, she was medically cleared and was admitted for psychiatric evaluation  2D echo with bubble 10/23 with LVEF 70 to 75%, G2 DD (pseudonormalization)  Patient reports began having a throbbing type chest pain last night, midsternal, lower.  No nausea or vomiting.  No dyspnea.  No change in bowel or bladder function.  No fevers or chills.  No cough.  Reports feels different than prior chest pain, chest pain the past more of a sharp, stabbing sensation.  Today is more throbbing, pulsing sensation. Does not report SI/HI. Requesting breakfast tray.   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    Encounter for assessment of healthcare decision-making capacity    History of cervical fracture 12/24/2017   nondisplaced fracture lateral mass of C1 on the right on CT 12/24/17   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) 07/19/2017   Hypertension    Hypertensive emergency 05/31/2022   Ischemic stroke (HCC) 01/01/2020   subacute right middle cerebellar peduncle and pons  infarction   Left-sided weakness 01/27/2022   Head CT with remote right occipital infarct which is consistent with old left-sided weakness   MDD (major depressive disorder), recurrent severe, 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   Normocytic anemia 02/09/2020   Opiate use    History of Suboxone  Therapy until 05/2021   Polysubstance abuse (HCC) 07/19/2017   Prescription drug misadventures (Seroquel )    Patient Active Problem List   Diagnosis Date Noted   MDD (major depressive disorder) 03/03/2024   Suicidal ideation 03/02/2024   Crack cocaine use 03/02/2024   Hallucinations 12/28/2023   Psychoactive substance-induced organic mood disorder (HCC) 10/22/2023   MDD (major depressive disorder), recurrent, severe, with psychosis (HCC) 10/21/2023   Symptomatic anemia 09/17/2023   Cocaine use disorder (HCC) 02/08/2023   Malingering 12/22/2022   Inability to access community resources due to transportation insecurity 12/22/2022   Stroke-like symptoms 08/05/2022   Toxic encephalopathy 08/04/2022   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) 05/01/2022   Iron  deficiency anemia 04/07/2022   Fall at home, initial encounter 04/07/2022   Ambulatory dysfunction 04/07/2022   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 04/07/2022   Class 1 obesity 04/06/2022   Tobacco abuse 04/06/2022   Abdominal pain 04/04/2022   History of stroke 03/31/2022   Hemiparesis affecting left side as late effect of cerebrovascular accident (CVA) (HCC) 03/31/2022   Opiate use    Microcytic anemia 03/30/2022   Noncompliance with medications    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   History of cervical fracture 12/24/2017   Type 2 diabetes mellitus (HCC) 12/06/2017  Hypertension 12/06/2017   Polysubstance abuse (HCC) 07/19/2017   Schizoaffective disorder, bipolar type (HCC) 07/19/2017   Cannabis use disorder, moderate, dependence (HCC) 03/24/2015   Depression  03/24/2015   Cannabis abuse 03/23/2013   Home Medication(s) Prior to Admission medications   Medication Sig Start Date End Date Taking? Authorizing Provider  pantoprazole  (PROTONIX ) 40 MG tablet Take 1 tablet (40 mg total) by mouth daily for 14 days. 05/13/24 05/27/24 Yes Elnor Savant A, DO  sucralfate (CARAFATE) 1 g tablet Take 1 tablet (1 g total) by mouth with breakfast, with lunch, and with evening meal for 7 days. 05/13/24 05/20/24 Yes Elnor Savant LABOR, DO  ARIPiprazole  ER (ABILIFY  MAINTENA) 400 MG SRER injection Inject 2 mLs (400 mg total) into the muscle every 28 (twenty-eight) days for 1 dose. 05/23/24 05/24/24  Towana Leita SAILOR, MD  hydrOXYzine  (ATARAX ) 50 MG tablet Take 1 tablet (50 mg total) by mouth every 6 (six) hours as needed for anxiety. 05/12/24 06/11/24  Towana Leita SAILOR, MD  losartan  (COZAAR ) 50 MG tablet Take 1 tablet (50 mg total) by mouth daily. 05/12/24 06/11/24  Towana Leita SAILOR, MD  metFORMIN  (GLUCOPHAGE ) 500 MG tablet Take 1 tablet (500 mg total) by mouth 2 (two) times daily with a meal. 05/12/24 06/11/24  Towana Leita SAILOR, MD  QUEtiapine  (SEROQUEL ) 300 MG tablet Take 1 tablet (300 mg total) by mouth at bedtime. 05/12/24   Towana Leita SAILOR, MD  QUEtiapine  (SEROQUEL ) 50 MG tablet Take 1 tablet (50 mg total) by mouth daily at 2 PM. 05/12/24 06/11/24  Towana Leita SAILOR, MD                                                                                                                                    Past Surgical History Past Surgical History:  Procedure Laterality Date   CESAREAN SECTION     Family History Family History  Problem Relation Age of Onset   Hypertension Mother    CAD Mother 54       died of MI at age 81   Hypertension Father     Social History Social History   Tobacco Use   Smoking status: Every Day    Current packs/day: 1.00    Average packs/day: 1 pack/day for 25.9 years (25.9 ttl pk-yrs)    Types: Cigarettes    Start date: 06/16/1998    Passive  exposure: Current   Smokeless tobacco: Former  Building Services Engineer status: Every Day  Substance Use Topics   Alcohol use: Not Currently   Drug use: Yes    Types: Marijuana, Cocaine, Crack cocaine    Comment: occassionally   Allergies Tomato, Hydrocodone , and Latex  Review of Systems A thorough review of systems was obtained and all systems are negative except as noted in the HPI and PMH.   Physical Exam Vital Signs  I have reviewed the triage vital signs BP 134/70  Pulse 66   Temp 98.1 F (36.7 C) (Oral)   Resp (!) 22   Ht 5' 4 (1.626 m)   Wt 65.8 kg   LMP 05/06/2024   SpO2 100%   BMI 24.89 kg/m  Physical Exam Vitals and nursing note reviewed.  Constitutional:      General: She is not in acute distress.    Appearance: Normal appearance.  HENT:     Head: Normocephalic and atraumatic.     Right Ear: External ear normal.     Left Ear: External ear normal.     Nose: Nose normal.     Mouth/Throat:     Mouth: Mucous membranes are moist.  Eyes:     General: No scleral icterus.       Right eye: No discharge.        Left eye: No discharge.  Cardiovascular:     Rate and Rhythm: Normal rate and regular rhythm.     Pulses: Normal pulses.     Heart sounds: Normal heart sounds.  Pulmonary:     Effort: Pulmonary effort is normal. No respiratory distress.     Breath sounds: Normal breath sounds. No stridor.  Chest:    Abdominal:     General: Abdomen is flat. There is no distension.     Palpations: Abdomen is soft.     Tenderness: There is abdominal tenderness.   Musculoskeletal:     Cervical back: No rigidity.     Right lower leg: No edema.     Left lower leg: No edema.  Skin:    General: Skin is warm and dry.     Capillary Refill: Capillary refill takes less than 2 seconds.  Neurological:     Mental Status: She is alert.  Psychiatric:        Mood and Affect: Mood normal.        Behavior: Behavior normal. Behavior is cooperative.     ED Results and  Treatments Labs (all labs ordered are listed, but only abnormal results are displayed) Labs Reviewed  COMPREHENSIVE METABOLIC PANEL WITH GFR - Abnormal; Notable for the following components:      Result Value   Glucose, Bld 121 (*)    Calcium  8.7 (*)    Albumin 3.3 (*)    All other components within normal limits  LIPASE, BLOOD - Abnormal; Notable for the following components:   Lipase 104 (*)    All other components within normal limits  CBC WITH DIFFERENTIAL/PLATELET - Abnormal; Notable for the following components:   RBC 3.67 (*)    Hemoglobin 8.7 (*)    HCT 28.9 (*)    MCV 78.7 (*)    MCH 23.7 (*)    RDW 20.9 (*)    All other components within normal limits  RESP PANEL BY RT-PCR (RSV, FLU A&B, COVID)  RVPGX2  RAPID URINE DRUG SCREEN, HOSP PERFORMED  PREGNANCY, URINE  TROPONIN I (HIGH SENSITIVITY)  Radiology DG Chest Port 1 View Result Date: 05/13/2024 CLINICAL DATA:  Chest pain. EXAM: PORTABLE CHEST 1 VIEW COMPARISON:  05/06/2024 and older exams.  CT, 09/28/2023. FINDINGS: Cardiac silhouette is mildly enlarged, stable. No mediastinal or hilar masses. Clear lungs.  No pleural effusion or pneumothorax. Skeletal structures are grossly intact. IMPRESSION: 1. No acute cardiopulmonary disease. Electronically Signed   By: Alm Parkins M.D.   On: 05/13/2024 10:12    Pertinent labs & imaging results that were available during my care of the patient were reviewed by me and considered in my medical decision making (see MDM for details).  Medications Ordered in ED Medications  sodium chloride  0.9 % bolus 1,000 mL (1,000 mLs Intravenous New Bag/Given 05/13/24 0956)  acetaminophen  (TYLENOL ) tablet 1,000 mg (1,000 mg Oral Given 05/13/24 0957)  alum & mag hydroxide-simeth (MAALOX/MYLANTA) 200-200-20 MG/5ML suspension 30 mL (30 mLs Oral Given 05/13/24 0957)                                                                                                                                      Procedures Procedures  (including critical care time)  Medical Decision Making / ED Course    Medical Decision Making:    TRINI SOLDO is a 48 y.o. female with past medical history as below, significant for cannabis use, bipolar 1 disorder, adjustment disorder, cocaine abuse, hypertension, ischemic stroke, MDD, prescription drug misadventures who presents to the ED with complaint of chest pain. The complaint involves an extensive differential diagnosis and also carries with it a high risk of complications and morbidity.  Serious etiology was considered. Ddx includes but is not limited to: Differential includes all life-threatening causes for chest pain. This includes but is not exclusive to acute coronary syndrome, aortic dissection, pulmonary embolism, cardiac tamponade, community-acquired pneumonia, pericarditis, musculoskeletal chest wall pain, malingering etc.   Complete initial physical exam performed, notably the patient was in NAD.    Reviewed and confirmed nursing documentation for past medical history, family history, social history.  Vital signs reviewed.    Lower chest wall pain/epig pain  Atypical chest pain> - Pain ongoing since yesterday evening.  Throbbing, pulsing sensation.  Does not radiate.  Worse with position changes. - Multiple ER visits over the past 6 months - Patient reports compliance with the regular medications, reports last cocaine use was around a week ago - labs > mild lipase elev, no fever, no leukocytosis. Tolerating po, pain resolved; favor mild pancreatitis - CXR > stable  Clinical Course as of 05/13/24 1440  Fri May 13, 2024  1150 Lipase(!): 104 Hx chronic pancreatitis, she has mild epig ttp, no n/v [SG]  1235 Reports feeling better, pain greatly improved  [SG]  1248 Hemoglobin(!): 8.7 Lower than prior, she denies bleeding  blood in stool or melena. She is not anticoagulated. She has likely mild pancreatitis in setting of polysubstance abuse, etoh use. Will start on  ppi/carafate/gi f/u [SG]    Clinical Course User Index [SG] Elnor Jayson LABOR, DO     The patient's chest pain is not suggestive of pulmonary embolus, cardiac ischemia, aortic dissection, pericarditis, myocarditis, pulmonary embolism, pneumothorax, pneumonia, Zoster, or esophageal perforation, or other serious etiology.  Historically not abrupt in onset, tearing or ripping, pulses symmetric. EKG nonspecific for ischemia/infarction. No dysrhythmias, brugada, WPW, prolonged QT noted.   Troponin negative x2. CXR reviewed. Labs without demonstration of acute pathology unless otherwise noted above.   Symptoms today favored to be due to GI source/ pancreatitis, doubt ACS  PPI, carafate, f/u GI  Given the extremely low risk of these diagnoses further testing and evaluation for these possibilities does not appear to be indicated at this time. Patient in no distress and overall condition improved here in the ED. Detailed discussions were had with the patient regarding current findings, and need for close f/u with PCP or on call doctor. The patient has been instructed to return immediately if the symptoms worsen in any way for re-evaluation. Patient verbalized understanding and is in agreement with current care plan. All questions answered prior to discharge.                Additional history obtained: -Additional history obtained from na -External records from outside source obtained and reviewed including: Chart review including previous notes, labs, imaging, consultation notes including  Prior labs/imaging Prior echo Prior ER eval Home meds   Lab Tests: -I ordered, reviewed, and interpreted labs.   The pertinent results include:   Labs Reviewed  COMPREHENSIVE METABOLIC PANEL WITH GFR - Abnormal; Notable for the following components:       Result Value   Glucose, Bld 121 (*)    Calcium  8.7 (*)    Albumin 3.3 (*)    All other components within normal limits  LIPASE, BLOOD - Abnormal; Notable for the following components:   Lipase 104 (*)    All other components within normal limits  CBC WITH DIFFERENTIAL/PLATELET - Abnormal; Notable for the following components:   RBC 3.67 (*)    Hemoglobin 8.7 (*)    HCT 28.9 (*)    MCV 78.7 (*)    MCH 23.7 (*)    RDW 20.9 (*)    All other components within normal limits  RESP PANEL BY RT-PCR (RSV, FLU A&B, COVID)  RVPGX2  RAPID URINE DRUG SCREEN, HOSP PERFORMED  PREGNANCY, URINE  TROPONIN I (HIGH SENSITIVITY)    Notable for as above, lipase +  EKG   EKG Interpretation Date/Time:  Friday May 13 2024 09:41:06 EST Ventricular Rate:  66 PR Interval:  149 QRS Duration:  102 QT Interval:  437 QTC Calculation: 458 R Axis:   33  Text Interpretation: Sinus arrhythmia Interpretation limited secondary to artifact similar to prior Confirmed by Elnor Jayson ([id number]) on 05/13/2024 9:50:30 AM         Imaging Studies ordered: I ordered imaging studies including CXR I independently visualized the following imaging with scope of interpretation limited to determining acute life threatening conditions related to emergency care; findings noted above I agree with the radiologist interpretation If any imaging was obtained with contrast I closely monitored patient for any possible adverse reaction a/w contrast administration in the emergency department   Medicines ordered and prescription drug management: Meds ordered this encounter  Medications   sodium chloride  0.9 % bolus 1,000 mL   acetaminophen  (TYLENOL ) tablet 1,000 mg   alum & mag hydroxide-simeth (MAALOX/MYLANTA) 200-200-20 MG/5ML  suspension 30 mL   pantoprazole  (PROTONIX ) 40 MG tablet    Sig: Take 1 tablet (40 mg total) by mouth daily for 14 days.    Dispense:  14 tablet    Refill:  0   sucralfate (CARAFATE) 1 g tablet     Sig: Take 1 tablet (1 g total) by mouth with breakfast, with lunch, and with evening meal for 7 days.    Dispense:  21 tablet    Refill:  0    -I have reviewed the patients home medicines and have made adjustments as needed   Consultations Obtained: na   Cardiac Monitoring: The patient was maintained on a cardiac monitor.  I personally viewed and interpreted the cardiac monitored which showed an underlying rhythm of: nsr Continuous pulse oximetry interpreted by myself, 100% on RA.    Social Determinants of Health:  Diagnosis or treatment significantly limited by social determinants of health: current smoker, polysubstance abuse, and homelessness   Reevaluation: After the interventions noted above, I reevaluated the patient and found that they have improved  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    Encounter for assessment of healthcare decision-making capacity    History of cervical fracture 12/24/2017   nondisplaced fracture lateral mass of C1 on the right on CT 12/24/17   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) 07/19/2017   Hypertension    Hypertensive emergency 05/31/2022   Ischemic stroke (HCC) 01/01/2020   subacute right middle cerebellar peduncle and pons infarction   Left-sided weakness 01/27/2022   Head CT with remote right occipital infarct which is consistent with old left-sided weakness   MDD (major depressive disorder), recurrent severe, 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   Normocytic anemia 02/09/2020   Opiate use    History of Suboxone  Therapy until 05/2021   Polysubstance abuse (HCC) 07/19/2017    Prescription drug misadventures (Seroquel )       Dispostion: Disposition decision including need for hospitalization was considered, and patient discharged from emergency department.    Final Clinical Impression(s) / ED Diagnoses Final diagnoses:  Noncompliance with medication regimen  Polysubstance abuse (HCC)  Epigastric pain  Atypical chest pain  Anemia, unspecified type  Acute pancreatitis, unspecified complication status, unspecified pancreatitis type        Elnor Jayson LABOR, DO 05/13/24 1440

## 2024-05-13 NOTE — ED Notes (Signed)
 Pt ambulatory to and from the bathroom, pt requests a cab ride home, explained that I could get her a bus ticket, bus ticket given, pt states that she can't take the but, explained that this is what I had to offer. Checked with charge RN, bus ticket given, pt then became agitated and called this rn a racist white bitch pt ambulatory from charge desk back to room 6, pt remains agitated, attempted to de escalate pt, pt from department.

## 2024-05-13 NOTE — ED Triage Notes (Signed)
 Per EMS, Pt, from home, cough, congestion, and chest tightness x over 1 month.  Pain score 10/10.  Pt reports throbbing began last night.  Hx of anemia, asthma, GERD, Bipolar, and cocaine abuse.  Pt was recently seen at [facility] for same.

## 2024-05-14 ENCOUNTER — Emergency Department (HOSPITAL_COMMUNITY)
Admission: EM | Admit: 2024-05-14 | Discharge: 2024-05-14 | Disposition: A | Discharge status: Home / Self Care | Payer: MEDICAID | Attending: Emergency Medicine | Admitting: Emergency Medicine

## 2024-05-14 ENCOUNTER — Emergency Department (HOSPITAL_COMMUNITY): Payer: MEDICAID

## 2024-05-14 ENCOUNTER — Other Ambulatory Visit: Payer: Self-pay

## 2024-05-14 ENCOUNTER — Encounter (HOSPITAL_COMMUNITY): Payer: Self-pay

## 2024-05-14 DIAGNOSIS — I10 Essential (primary) hypertension: Secondary | ICD-10-CM | POA: Diagnosis not present

## 2024-05-14 DIAGNOSIS — W19XXXA Unspecified fall, initial encounter: Secondary | ICD-10-CM

## 2024-05-14 DIAGNOSIS — W1839XA Other fall on same level, initial encounter: Secondary | ICD-10-CM | POA: Insufficient documentation

## 2024-05-14 DIAGNOSIS — Z91040 Latex allergy status: Secondary | ICD-10-CM | POA: Insufficient documentation

## 2024-05-14 DIAGNOSIS — Z8673 Personal history of transient ischemic attack (TIA), and cerebral infarction without residual deficits: Secondary | ICD-10-CM | POA: Insufficient documentation

## 2024-05-14 DIAGNOSIS — Z79899 Other long term (current) drug therapy: Secondary | ICD-10-CM | POA: Insufficient documentation

## 2024-05-14 DIAGNOSIS — R519 Headache, unspecified: Secondary | ICD-10-CM | POA: Insufficient documentation

## 2024-05-14 DIAGNOSIS — Y92521 Bus station as the place of occurrence of the external cause: Secondary | ICD-10-CM | POA: Diagnosis not present

## 2024-05-14 DIAGNOSIS — Z765 Malingerer [conscious simulation]: Secondary | ICD-10-CM | POA: Insufficient documentation

## 2024-05-14 DIAGNOSIS — W01198A Fall on same level from slipping, tripping and stumbling with subsequent striking against other object, initial encounter: Secondary | ICD-10-CM | POA: Insufficient documentation

## 2024-05-14 DIAGNOSIS — R443 Hallucinations, unspecified: Secondary | ICD-10-CM | POA: Insufficient documentation

## 2024-05-14 LAB — CBC WITH DIFFERENTIAL/PLATELET
Abs Immature Granulocytes: 0.01 K/uL (ref 0.00–0.07)
Basophils Absolute: 0.1 K/uL (ref 0.0–0.1)
Basophils Relative: 1 %
Eosinophils Absolute: 0.3 K/uL (ref 0.0–0.5)
Eosinophils Relative: 6 %
HCT: 29.2 % — ABNORMAL LOW (ref 36.0–46.0)
Hemoglobin: 8.6 g/dL — ABNORMAL LOW (ref 12.0–15.0)
Immature Granulocytes: 0 %
Lymphocytes Relative: 24 %
Lymphs Abs: 1.2 K/uL (ref 0.7–4.0)
MCH: 23.5 pg — ABNORMAL LOW (ref 26.0–34.0)
MCHC: 29.5 g/dL — ABNORMAL LOW (ref 30.0–36.0)
MCV: 79.8 fL — ABNORMAL LOW (ref 80.0–100.0)
Monocytes Absolute: 0.3 K/uL (ref 0.1–1.0)
Monocytes Relative: 6 %
Neutro Abs: 3 K/uL (ref 1.7–7.7)
Neutrophils Relative %: 63 %
Platelets: 326 K/uL (ref 150–400)
RBC: 3.66 MIL/uL — ABNORMAL LOW (ref 3.87–5.11)
RDW: 20.7 % — ABNORMAL HIGH (ref 11.5–15.5)
WBC: 4.9 K/uL (ref 4.0–10.5)
nRBC: 0 % (ref 0.0–0.2)

## 2024-05-14 LAB — HCG, SERUM, QUALITATIVE: Preg, Serum: NEGATIVE

## 2024-05-14 LAB — COMPREHENSIVE METABOLIC PANEL WITH GFR
ALT: 21 U/L (ref 0–44)
AST: 21 U/L (ref 15–41)
Albumin: 3.2 g/dL — ABNORMAL LOW (ref 3.5–5.0)
Alkaline Phosphatase: 48 U/L (ref 38–126)
Anion gap: 9 (ref 5–15)
BUN: 9 mg/dL (ref 6–20)
CO2: 24 mmol/L (ref 22–32)
Calcium: 8.4 mg/dL — ABNORMAL LOW (ref 8.9–10.3)
Chloride: 105 mmol/L (ref 98–111)
Creatinine, Ser: 0.69 mg/dL (ref 0.44–1.00)
GFR, Estimated: >60 mL/min (ref >60)
Glucose, Bld: 92 mg/dL (ref 70–99)
Potassium: 3.7 mmol/L (ref 3.5–5.1)
Sodium: 138 mmol/L (ref 135–145)
Total Bilirubin: 0.4 mg/dL (ref 0.0–1.2)
Total Protein: 6.4 g/dL — ABNORMAL LOW (ref 6.5–8.1)

## 2024-05-14 LAB — RAPID URINE DRUG SCREEN, HOSP PERFORMED
Amphetamines: NOT DETECTED
Barbiturates: NOT DETECTED
Benzodiazepines: NOT DETECTED
Cocaine: POSITIVE — AB
Opiates: NOT DETECTED
Tetrahydrocannabinol: NOT DETECTED

## 2024-05-14 LAB — ETHANOL: Alcohol, Ethyl (B): <15 mg/dL (ref <15)

## 2024-05-14 MED ORDER — IBUPROFEN 800 MG PO TABS
800.0000 mg | ORAL_TABLET | Freq: Once | ORAL | Status: AC
  Administered 2024-05-14: 800 mg via ORAL
  Filled 2024-05-14: qty 1

## 2024-05-14 MED ORDER — ACETAMINOPHEN 325 MG PO TABS
650.0000 mg | ORAL_TABLET | Freq: Once | ORAL | Status: AC
  Administered 2024-05-14: 650 mg via ORAL
  Filled 2024-05-14: qty 2

## 2024-05-14 MED ORDER — ACETAMINOPHEN 500 MG PO TABS
1000.0000 mg | ORAL_TABLET | Freq: Once | ORAL | Status: AC
  Administered 2024-05-14: 1000 mg via ORAL
  Filled 2024-05-14: qty 2

## 2024-05-14 NOTE — ED Notes (Signed)
 Patient transported to CT

## 2024-05-14 NOTE — Discharge Instructions (Addendum)
 Please follow-up with behavioral health outpatient and your primary care provider.  Seek emergency care if experiencing any new or worsening symptoms.

## 2024-05-14 NOTE — ED Notes (Signed)
 Pt states she fell and hit her head PTA and believes that's why her head is hurting.

## 2024-05-14 NOTE — ED Triage Notes (Signed)
 Pt bib [facility] from home c/o headache. Pt reports she did not take anything for pain PTA.    BP 184/89 HR 84 RR 16 100% RA 97.4

## 2024-05-14 NOTE — ED Provider Notes (Addendum)
 [place] EMERGENCY DEPARTMENT AT [facility] Provider Note   CSN: [id number] Arrival date & time: 05/14/24  9549     Patient presents with: No chief complaint on file.   Kaitlyn Gray is a 48 y.o. female.   The history is provided by the patient.  Illness Location:  In the lobby at [facility] Quality:  Patient wanted to be taken home by [facility] but is not eligible, then she wanted a cab voucher.  We did not have so gave her a bus pass.  this was unacceptable to the patient who then checked back in for hallucinations Severity:  Mild Onset quality:  Sudden Duration: minutes. Timing:  Constant Progression:  Unchanged Chronicity:  New Context:  Was just discharged by me Relieved by:  Nothing Associated symptoms: no vomiting and no wheezing   No SI or Hi. Stating she has had hallucination prior to being seen.       Prior to Admission medications   Medication Sig Start Date End Date Taking? Authorizing Provider  ARIPiprazole  ER (ABILIFY  MAINTENA) 400 MG SRER injection Inject 2 mLs (400 mg total) into the muscle every 28 (twenty-eight) days for 1 dose. 05/23/24 05/24/24  Towana Leita LOISE, MD  hydrOXYzine  (ATARAX ) 50 MG tablet Take 1 tablet (50 mg total) by mouth every 6 (six) hours as needed for anxiety. 05/12/24 06/11/24  Towana Leita LOISE, MD  losartan  (COZAAR ) 50 MG tablet Take 1 tablet (50 mg total) by mouth daily. 05/12/24 06/11/24  Towana Leita LOISE, MD  metFORMIN  (GLUCOPHAGE ) 500 MG tablet Take 1 tablet (500 mg total) by mouth 2 (two) times daily with a meal. 05/12/24 06/11/24  Towana Leita LOISE, MD  pantoprazole  (PROTONIX ) 40 MG tablet Take 1 tablet (40 mg total) by mouth daily for 14 days. 05/13/24 05/27/24  Elnor Savant A, DO  QUEtiapine  (SEROQUEL ) 300 MG tablet Take 1 tablet (300 mg total) by mouth at bedtime. 05/12/24   Towana Leita LOISE, MD  QUEtiapine  (SEROQUEL ) 50 MG tablet Take 1 tablet (50 mg total) by mouth daily at 2 PM. 05/12/24 06/11/24  Towana Leita LOISE, MD   sucralfate (CARAFATE) 1 g tablet Take 1 tablet (1 g total) by mouth with breakfast, with lunch, and with evening meal for 7 days. 05/13/24 05/20/24  Elnor Savant LABOR, DO    Allergies: Tomato, Hydrocodone , and Latex    Review of Systems  Respiratory:  Negative for wheezing.   Gastrointestinal:  Negative for vomiting.  Psychiatric/Behavioral:  Negative for dysphoric mood, self-injury and suicidal ideas. The patient is not nervous/anxious and is not hyperactive.   All other systems reviewed and are negative.   Updated Vital Signs LMP 05/06/2024   Physical Exam Vitals and nursing note reviewed.  Constitutional:      General: She is not in acute distress.    Appearance: She is well-developed.  HENT:     Head: Normocephalic and atraumatic.     Nose: Nose normal.  Eyes:     Pupils: Pupils are equal, round, and reactive to light.  Cardiovascular:     Rate and Rhythm: Normal rate and regular rhythm.     Pulses: Normal pulses.     Heart sounds: Normal heart sounds.  Pulmonary:     Effort: Pulmonary effort is normal. No respiratory distress.     Breath sounds: Normal breath sounds.  Abdominal:     General: Bowel sounds are normal. There is no distension.     Palpations: Abdomen is soft.  Tenderness: There is no abdominal tenderness. There is no guarding or rebound.  Musculoskeletal:        General: Normal range of motion.     Cervical back: Neck supple.  Skin:    General: Skin is warm and dry.     Capillary Refill: Capillary refill takes less than 2 seconds.     Findings: No erythema or rash.  Neurological:     General: No focal deficit present.     Deep Tendon Reflexes: Reflexes normal.  Psychiatric:        Mood and Affect: Mood normal.        Thought Content: Thought content normal.     (all labs ordered are listed, but only abnormal results are displayed) Labs Reviewed - No data to display  EKG: None  Radiology: CT Head Wo Contrast Result Date: 05/14/2024 EXAM:  CT HEAD WITHOUT CONTRAST 05/14/2024 04:00:00 AM TECHNIQUE: CT of the head was performed without the administration of intravenous contrast. Automated exposure control, iterative reconstruction, and/or weight based adjustment of the mA/kV was utilized to reduce the radiation dose to as low as reasonably achievable. COMPARISON: CT of the head dated 03/01/2024. CLINICAL HISTORY: Syncope/presyncope, cerebrovascular cause suspected. FINDINGS: BRAIN AND VENTRICLES: No acute hemorrhage. No evidence of acute infarct. No hydrocephalus. No extra-axial collection. No mass effect or midline shift. Age-related atrophy. Mild diffuse cerebral white matter disease. 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 There are also encephalomalacia changes in the left cerebral vertex at the frontoparietal junction. ORBITS: No acute abnormality. Chronic fracture of the right medial orbital wall. SINUSES: No acute abnormality. SOFT TISSUES AND SKULL: No acute soft tissue abnormality. No skull fracture. IMPRESSION: 1. No acute intracranial abnormality. 2. Age-related atrophy and mild diffuse cerebral white matter disease. 3. 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 4. Chronic fracture of the right medial orbital wall. Electronically signed by: Evalene Coho MD 05/14/2024 04:05 AM EST RP Workstation: [id number]   DG Chest Port 1 View Result Date: 05/13/2024 CLINICAL DATA:  Chest pain. EXAM: PORTABLE CHEST 1 VIEW COMPARISON:  05/06/2024 and older exams.  CT, 09/28/2023. FINDINGS: Cardiac silhouette is mildly enlarged, stable. No mediastinal or hilar masses. Clear lungs.  No pleural effusion or pneumothorax. Skeletal structures are grossly intact. IMPRESSION: 1. No acute cardiopulmonary disease. Electronically Signed   By: Alm Parkins M.D.   On: 05/13/2024 10:12     Procedures   Medications Ordered in the ED - No data to  display                                  Medical Decision Making Patient checked back in for hallucinations   Amount and/or Complexity of Data Reviewed External Data Reviewed: radiology and notes.    Details: My note reviewed   Risk Risk Details: There is no SI or HI.  Patient is not responding to internal stimuli.  She was asked these questions when I saw her this evening and they did not exist.  She is reporting this because she does not have the desired ride home.  Given resources.  No acute emergency condition at this time.  Stable for discharge .     Final diagnoses:  Malingering   No signs of systemic illness or infection. The patient is nontoxic-appearing on exam and vital signs are within normal limits.  I have reviewed the  triage vital signs and the nursing notes. Pertinent labs & imaging results that were available during my care of the patient were reviewed by me and considered in my medical decision making (see chart for details). After history, exam, and medical workup I feel the patient has been appropriately medically screened and is safe for discharge home. Pertinent diagnoses were discussed with the patient. Patient was given return precautions.    ED Discharge Orders     None             Wilmot Quevedo, MD 05/14/24 [phone]

## 2024-05-14 NOTE — ED Provider Notes (Signed)
 [place] EMERGENCY DEPARTMENT AT [facility] Provider Note   CSN: [id number] Arrival date & time: 05/14/24  9742     Patient presents with: Headache   Kaitlyn Gray is a 48 y.o. female.   The history is provided by the patient.  Headache Pain location:  Occipital Quality:  Dull Radiates to:  Does not radiate Timing:  Constant Progression:  Unchanged Chronicity:  New Context comment:  Fall reports hit head, also teeth grinding Relieved by:  Nothing Worsened by:  Nothing Ineffective treatments:  None tried Associated symptoms: no dizziness, no fever, no focal weakness, no loss of balance, no nausea, no neck pain, no neck stiffness, no numbness, no paresthesias, no photophobia, no URI, no visual change, no vomiting and no weakness   Risk factors: no family hx of SAH and does not have insomnia        Prior to Admission medications   Medication Sig Start Date End Date Taking? Authorizing Provider  ARIPiprazole  ER (ABILIFY  MAINTENA) 400 MG SRER injection Inject 2 mLs (400 mg total) into the muscle every 28 (twenty-eight) days for 1 dose. 05/23/24 05/24/24  Towana Leita LOISE, MD  hydrOXYzine  (ATARAX ) 50 MG tablet Take 1 tablet (50 mg total) by mouth every 6 (six) hours as needed for anxiety. 05/12/24 06/11/24  Towana Leita LOISE, MD  losartan  (COZAAR ) 50 MG tablet Take 1 tablet (50 mg total) by mouth daily. 05/12/24 06/11/24  Towana Leita LOISE, MD  metFORMIN  (GLUCOPHAGE ) 500 MG tablet Take 1 tablet (500 mg total) by mouth 2 (two) times daily with a meal. 05/12/24 06/11/24  Towana Leita LOISE, MD  pantoprazole  (PROTONIX ) 40 MG tablet Take 1 tablet (40 mg total) by mouth daily for 14 days. 05/13/24 05/27/24  Elnor Savant A, DO  QUEtiapine  (SEROQUEL ) 300 MG tablet Take 1 tablet (300 mg total) by mouth at bedtime. 05/12/24   Towana Leita LOISE, MD  QUEtiapine  (SEROQUEL ) 50 MG tablet Take 1 tablet (50 mg total) by mouth daily at 2 PM. 05/12/24 06/11/24  Towana Leita LOISE, MD  sucralfate  (CARAFATE) 1 g tablet Take 1 tablet (1 g total) by mouth with breakfast, with lunch, and with evening meal for 7 days. 05/13/24 05/20/24  Elnor Savant LABOR, DO    Allergies: Tomato, Hydrocodone , and Latex    Review of Systems  Constitutional:  Negative for fever.  Eyes:  Negative for photophobia.  Gastrointestinal:  Negative for nausea and vomiting.  Musculoskeletal:  Negative for neck pain and neck stiffness.  Neurological:  Positive for headaches. Negative for dizziness, focal weakness, facial asymmetry, speech difficulty, weakness, numbness, paresthesias and loss of balance.  All other systems reviewed and are negative.   Updated Vital Signs BP (!) 172/94   Pulse 64   Temp (!) 97.4 F (36.3 C) (Oral)   Resp 17   LMP 05/06/2024   SpO2 100%   Physical Exam Vitals and nursing note reviewed. Exam conducted with a chaperone present.  Constitutional:      General: She is not in acute distress.    Appearance: Normal appearance. She is well-developed.  HENT:     Head: Normocephalic and atraumatic.     Right Ear: Tympanic membrane normal.     Left Ear: Tympanic membrane normal.     Nose: Nose normal.  Eyes:     Pupils: Pupils are equal, round, and reactive to light.  Cardiovascular:     Rate and Rhythm: Normal rate and regular rhythm.     Pulses: Normal  pulses.     Heart sounds: Normal heart sounds.  Pulmonary:     Effort: Pulmonary effort is normal. No respiratory distress.     Breath sounds: Normal breath sounds.  Abdominal:     General: Bowel sounds are normal. There is no distension.     Palpations: Abdomen is soft.     Tenderness: There is no abdominal tenderness. There is no guarding or rebound.  Musculoskeletal:        General: Normal range of motion.     Cervical back: Normal range of motion and neck supple. No rigidity or tenderness.  Skin:    General: Skin is warm and dry.     Capillary Refill: Capillary refill takes less than 2 seconds.     Findings: No erythema  or rash.  Neurological:     General: No focal deficit present.     Mental Status: She is alert and oriented to person, place, and time.     Deep Tendon Reflexes: Reflexes normal.  Psychiatric:        Mood and Affect: Mood normal.     (all labs ordered are listed, but only abnormal results are displayed) Labs Reviewed - No data to display  EKG: None  Radiology: CT Head Wo Contrast Result Date: 05/14/2024 EXAM: CT HEAD WITHOUT CONTRAST 05/14/2024 04:00:00 AM TECHNIQUE: CT of the head was performed without the administration of intravenous contrast. Automated exposure control, iterative reconstruction, and/or weight based adjustment of the mA/kV was utilized to reduce the radiation dose to as low as reasonably achievable. COMPARISON: CT of the head dated 03/01/2024. CLINICAL HISTORY: Syncope/presyncope, cerebrovascular cause suspected. FINDINGS: BRAIN AND VENTRICLES: No acute hemorrhage. No evidence of acute infarct. No hydrocephalus. No extra-axial collection. No mass effect or midline shift. Age-related atrophy. Mild diffuse cerebral white matter disease. 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 There are also encephalomalacia changes in the left cerebral vertex at the frontoparietal junction. ORBITS: No acute abnormality. Chronic fracture of the right medial orbital wall. SINUSES: No acute abnormality. SOFT TISSUES AND SKULL: No acute soft tissue abnormality. No skull fracture. IMPRESSION: 1. No acute intracranial abnormality. 2. Age-related atrophy and mild diffuse cerebral white matter disease. 3. 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 4. Chronic fracture of the right medial orbital wall. Electronically signed by: Evalene Coho MD 05/14/2024 04:05 AM EST RP Workstation: [id number]   DG Chest Port 1 View Result Date: 05/13/2024 CLINICAL DATA:  Chest pain. EXAM: PORTABLE CHEST 1  VIEW COMPARISON:  05/06/2024 and older exams.  CT, 09/28/2023. FINDINGS: Cardiac silhouette is mildly enlarged, stable. No mediastinal or hilar masses. Clear lungs.  No pleural effusion or pneumothorax. Skeletal structures are grossly intact. IMPRESSION: 1. No acute cardiopulmonary disease. Electronically Signed   By: Alm Parkins M.D.   On: 05/13/2024 10:12     Procedures   Medications Ordered in the ED  ibuprofen  (ADVIL ) tablet 800 mg (has no administration in time range)  acetaminophen  (TYLENOL ) tablet 1,000 mg (1,000 mg Oral Given 05/14/24 0320)                                    Medical Decision Making Patient with HA post fall no medications taken   Amount and/or Complexity of Data Reviewed Independent Historian: EMS    Details: See above  External Data Reviewed: notes.    Details:  Previous notes reviewed  Radiology: ordered and independent interpretation performed.    Details: No acute finding on CT  Risk OTC drugs. Prescription drug management. Risk Details: Very well appearing.  Resting comfortably in the bed.  No non verbal cues of pain.  No medication taken PTA. CT scan is negative for acute finding.  Stable for discharge with close follow up,  strict return precautions.      Final diagnoses:  Headache disorder  Fall, initial encounter    No signs of systemic illness or infection. The patient is nontoxic-appearing on exam and vital signs are within normal limits.  I have reviewed the triage vital signs and the nursing notes. Pertinent labs & imaging results that were available during my care of the patient were reviewed by me and considered in my medical decision making (see chart for details). After history, exam, and medical workup I feel the patient has been appropriately medically screened and is safe for discharge home. Pertinent diagnoses were discussed with the patient. Patient was given return precautions.    ED Discharge Orders     None           Tadan Shill, MD 05/14/24 [phone]

## 2024-05-14 NOTE — ED Notes (Signed)
 ED Provider at bedside.
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
## 2024-05-14 NOTE — ED Provider Notes (Signed)
 [facility] Provider Note   CSN: [id number] Arrival date & time: 05/14/24  9354     Patient presents with: Fall and Suicidal   Kaitlyn Gray is a 48 y.o. female with PMHx HTN, prior CVA with chronic left sided deficits, polysubstance abuse, bipolar 1 disorder, who presents to ED concerned for mechanical fall at bus stop. This is the patient's 4th time being seen at ED in the past 24 hours. Patient's story changing intermittently. Patient currently complaining of occipital head pain d/t the recent mechanical fall. Patient received CT of head earlier today but states a new mechanical fall happened after discharge from ED leading to new pain in her head and she needs new CT imaging of head today. Also stating that she needs to speak to psychiatry concerning for SI. The story of SI seems to come and go as apparently patient left without being seen from [facility] earlier this morning. Patient also intermittently endorsing and then denying SI during recent ED visits. Patient has already been evaluated by psychiatry earlier this week and discharged.  Patient denies any other acute complaints and symptoms today.    Fall       Prior to Admission medications   Medication Sig Start Date End Date Taking? Authorizing Provider  ARIPiprazole  ER (ABILIFY  MAINTENA) 400 MG SRER injection Inject 2 mLs (400 mg total) into the muscle every 28 (twenty-eight) days for 1 dose. 05/23/24 05/24/24  Towana Leita LOISE, MD  hydrOXYzine  (ATARAX ) 50 MG tablet Take 1 tablet (50 mg total) by mouth every 6 (six) hours as needed for anxiety. 05/12/24 06/11/24  Towana Leita LOISE, MD  losartan  (COZAAR ) 50 MG tablet Take 1 tablet (50 mg total) by mouth daily. 05/12/24 06/11/24  Towana Leita LOISE, MD  metFORMIN  (GLUCOPHAGE ) 500 MG tablet Take 1 tablet (500 mg total) by mouth 2 (two) times daily with a meal. 05/12/24 06/11/24  Towana Leita LOISE, MD  pantoprazole  (PROTONIX ) 40 MG tablet Take 1  tablet (40 mg total) by mouth daily for 14 days. 05/13/24 05/27/24  Elnor Savant A, DO  QUEtiapine  (SEROQUEL ) 300 MG tablet Take 1 tablet (300 mg total) by mouth at bedtime. 05/12/24   Towana Leita LOISE, MD  QUEtiapine  (SEROQUEL ) 50 MG tablet Take 1 tablet (50 mg total) by mouth daily at 2 PM. 05/12/24 06/11/24  Towana Leita LOISE, MD  sucralfate (CARAFATE) 1 g tablet Take 1 tablet (1 g total) by mouth with breakfast, with lunch, and with evening meal for 7 days. 05/13/24 05/20/24  Elnor Savant LABOR, DO    Allergies: Tomato, Hydrocodone , and Latex    Review of Systems  Psychiatric/Behavioral:  Positive for suicidal ideas.     Updated Vital Signs BP (!) 162/75   Pulse 60   Temp 98.5 F (36.9 C) (Oral)   Resp 16   Ht 5' 4 (1.626 m)   Wt 65.8 kg   LMP 05/06/2024   SpO2 100%   BMI 24.89 kg/m   Physical Exam Vitals and nursing note reviewed.  Constitutional:      General: She is not in acute distress.    Appearance: She is not ill-appearing or toxic-appearing.  HENT:     Head: Normocephalic and atraumatic.     Comments: Some tenderness when palpating occipital area    Mouth/Throat:     Mouth: Mucous membranes are moist.  Eyes:     General: No scleral icterus.       Right eye: No discharge.  Left eye: No discharge.     Conjunctiva/sclera: Conjunctivae normal.  Cardiovascular:     Rate and Rhythm: Normal rate and regular rhythm.     Pulses: Normal pulses.     Heart sounds: Normal heart sounds. No murmur heard. Pulmonary:     Effort: Pulmonary effort is normal. No respiratory distress.     Breath sounds: Normal breath sounds. No wheezing, rhonchi or rales.  Abdominal:     General: Abdomen is flat. Bowel sounds are normal. There is no distension.     Palpations: Abdomen is soft. There is no mass.     Tenderness: There is no abdominal tenderness.  Musculoskeletal:     Right lower leg: No edema.     Left lower leg: No edema.  Skin:    General: Skin is warm and dry.      Findings: No rash.  Neurological:     General: No focal deficit present.     Mental Status: She is alert. Mental status is at baseline.     Comments: GCS 15. Speech is goal oriented. No deficits appreciated to CN III-XII; symmetric eyebrow raise, no facial drooping, tongue midline. 5/5 strength in right extremities; 4/5 strength in left extremities appears to be baseline for patient given prior CVA. Sensation to light touch intact. Patient moves extremities without ataxia. Patient ambulatory with steady gait.  Psychiatric:        Mood and Affect: Mood normal.     (all labs ordered are listed, but only abnormal results are displayed) Labs Reviewed  COMPREHENSIVE METABOLIC PANEL WITH GFR - Abnormal; Notable for the following components:      Result Value   Calcium  8.4 (*)    Total Protein 6.4 (*)    Albumin 3.2 (*)    All other components within normal limits  RAPID URINE DRUG SCREEN, HOSP PERFORMED - Abnormal; Notable for the following components:   Cocaine POSITIVE (*)    All other components within normal limits  CBC WITH DIFFERENTIAL/PLATELET - Abnormal; Notable for the following components:   RBC 3.66 (*)    Hemoglobin 8.6 (*)    HCT 29.2 (*)    MCV 79.8 (*)    MCH 23.5 (*)    MCHC 29.5 (*)    RDW 20.7 (*)    All other components within normal limits  HCG, SERUM, QUALITATIVE  ETHANOL    EKG: None  Radiology: CT Head Wo Contrast Result Date: 05/14/2024 EXAM: CT HEAD WITHOUT 05/14/2024 08:38:53 AM TECHNIQUE: CT of the head was performed without the administration of intravenous contrast. Automated exposure control, iterative reconstruction, and/or weight based adjustment of the mA/kV was utilized to reduce the radiation dose to as low as reasonably achievable. COMPARISON: 05/14/2024 CLINICAL HISTORY: Head trauma, moderate-severe. FINDINGS: BRAIN AND VENTRICLES: No acute intracranial hemorrhage. No mass effect or midline shift. No extra-axial fluid collection. No evidence of  acute infarct. No hydrocephalus. Patchy and confluent decreased attenuation throughout deep and periventricular white matter of cerebral hemispheres bilaterally, compatible with chronic microvascular ischemic disease. Chronic left frontoparietal and right occipital lobe encephalomalacia. ORBITS: Chronic right medial orbital wall fracture. SINUSES AND MASTOIDS: No acute abnormality. SOFT TISSUES AND SKULL: No acute skull fracture. No acute soft tissue abnormality. IMPRESSION: 1. No acute intracranial abnormality. 2. Chronic microvascular ischemic disease and chronic left frontoparietal and right occipital lobe encephalomalacia. 3. Chronic right medial orbital wall fracture. Electronically signed by: Evalene Coho MD 05/14/2024 08:54 AM EST RP Workstation: [id number]   CT Head Wo Contrast  Result Date: 05/14/2024 EXAM: CT HEAD WITHOUT CONTRAST 05/14/2024 04:00:00 AM TECHNIQUE: CT of the head was performed without the administration of intravenous contrast. Automated exposure control, iterative reconstruction, and/or weight based adjustment of the mA/kV was utilized to reduce the radiation dose to as low as reasonably achievable. COMPARISON: CT of the head dated 03/01/2024. CLINICAL HISTORY: Syncope/presyncope, cerebrovascular cause suspected. FINDINGS: BRAIN AND VENTRICLES: No acute hemorrhage. No evidence of acute infarct. No hydrocephalus. No extra-axial collection. No mass effect or midline shift. Age-related atrophy. Mild diffuse cerebral white matter disease. 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 There are also encephalomalacia changes in the left cerebral vertex at the frontoparietal junction. ORBITS: No acute abnormality. Chronic fracture of the right medial orbital wall. SINUSES: No acute abnormality. SOFT TISSUES AND SKULL: No acute soft tissue abnormality. No skull fracture. IMPRESSION: 1. No acute intracranial abnormality. 2. Age-related atrophy and mild diffuse  cerebral white matter disease. 3. 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 4. Chronic fracture of the right medial orbital wall. Electronically signed by: Evalene Coho MD 05/14/2024 04:05 AM EST RP Workstation: [id number]   DG Chest Port 1 View Result Date: 05/13/2024 CLINICAL DATA:  Chest pain. EXAM: PORTABLE CHEST 1 VIEW COMPARISON:  05/06/2024 and older exams.  CT, 09/28/2023. FINDINGS: Cardiac silhouette is mildly enlarged, stable. No mediastinal or hilar masses. Clear lungs.  No pleural effusion or pneumothorax. Skeletal structures are grossly intact. IMPRESSION: 1. No acute cardiopulmonary disease. Electronically Signed   By: Alm Parkins M.D.   On: 05/13/2024 10:12     Procedures   Medications Ordered in the ED  acetaminophen  (TYLENOL ) tablet 650 mg (has no administration in time range)                                    Medical Decision Making Amount and/or Complexity of Data Reviewed Labs: ordered. Radiology: ordered.   This patient presents to the ED for concern of headache, this involves an extensive number of treatment options, and is a complaint that carries with it a high risk of complications and morbidity.  The differential diagnosis includes migraine, tension headache, cluster headache, subarachnoid hemorrhage, meningitis/encephalitis, acute angle closure glaucoma, giant cell arteritis, idiopathic intercranial hypertension, ischemic stroke, ICH, cervical artery dissection   Co morbidities that complicate the patient evaluation  HTN, prior CVA with chronic left sided deficits, polysubstance abuse, bipolar 1 disorder   Additional history obtained:  Multiple recent ED visits and psychiatry notes reviewed.   Problem List / ED Course / Critical interventions / Medication management  Patient presents to ED with headache from head trauma after mechanical fall earlier this morning. No neck  pain. No other acute symptoms today. Physical/neuro exam reassuring.  Patient afebrile with stable vitals. Patient also requesting to talk with psychiatry team concern for SI. Per chart review, patient appears to be malingering as she has had 4 ED visits in the past 24 hours and each time with intermittent SI that seems to happen when patient is discharged. Patient has been recently evaluated and discharged by the psychiatry team earlier this week and I do not feel that it is necessary for a repeat evaluation again today since this seems to be patient's baseline.  I Ordered, and personally interpreted labs.  hCG negative.  CBC with chronic anemia with hemoglobin 8.6 today.  No leukocytosis.  CMP reassuring.  UDS positive for cocaine.  EtOH within normal limits.   I ordered imaging studies including CT head. I independently visualized and interpreted imaging which showed no acute process. I agree with the radiologist interpretation. Shared all results with patient. Answered all questions. Patient to follow up with outpatient behavioral health and PCP.  I have reviewed the patients home medicines and have made adjustments as needed The patient has been appropriately medically screened and/or stabilized in the ED. I have low suspicion for any other emergent medical condition which would require further screening, evaluation or treatment in the ED or require inpatient management. At time of discharge the patient is hemodynamically stable and in no acute distress. I have discussed work-up results and diagnosis with patient and answered all questions. Patient is agreeable with discharge plan. We discussed strict return precautions for returning to the emergency department and they verbalized understanding.      Social Determinants of Health:  homeless       Final diagnoses:  Fall, initial encounter  Malingering    ED Discharge Orders     None          Hoy Nidia FALCON, [place] 05/14/24  [id number]    Ellouise Richerd POUR, DO 05/14/24 1358

## 2024-06-04 ENCOUNTER — Emergency Department (HOSPITAL_COMMUNITY)
Admission: EM | Admit: 2024-06-04 | Discharge: 2024-06-04 | Disposition: A | Discharge status: Home / Self Care | Payer: MEDICAID | Attending: Emergency Medicine | Admitting: Emergency Medicine

## 2024-06-04 DIAGNOSIS — R45851 Suicidal ideations: Secondary | ICD-10-CM | POA: Insufficient documentation

## 2024-06-04 DIAGNOSIS — R519 Headache, unspecified: Secondary | ICD-10-CM | POA: Diagnosis present

## 2024-06-04 DIAGNOSIS — Z5321 Procedure and treatment not carried out due to patient leaving prior to being seen by health care provider: Secondary | ICD-10-CM | POA: Diagnosis not present

## 2024-06-04 NOTE — ED Notes (Signed)
 Pt stated no vitals at this time

## 2024-06-04 NOTE — ED Notes (Signed)
 Pt put on her clothes over scrubs and walked out the door saying The train is coming!. Attempts to redirect pt were not successful. Charge and security aware.

## 2024-06-04 NOTE — ED Notes (Signed)
 Pt will not stay in her room

## 2024-06-04 NOTE — ED Notes (Signed)
 Pt refusing vitals.

## 2024-06-04 NOTE — ED Notes (Signed)
 This clinical research associate went into triage room to attempt to collect labs for medical clearance and pt was  sleeping pt proceeded to state  It's hard to get blood work from me, while only opening one eye to look at this writer and then went back to  sleep.

## 2024-06-04 NOTE — ED Triage Notes (Signed)
 Pt BIBA EMS from home for suicidal ideation.  Pt states I want to kill myself, I don't have my medicine and I woke up wanting to kill myself  Pt states she ran out of Seroquel  3 weeks ago.  Pt refusing vitals, states wanting to speak with psych doctor

## 2024-07-20 ENCOUNTER — Emergency Department (HOSPITAL_COMMUNITY)
Admission: EM | Admit: 2024-07-20 | Discharge: 2024-07-20 | Discharge status: Against Medical Advice | Payer: MEDICAID | Source: Home / Self Care

## 2024-07-20 NOTE — ED Triage Notes (Signed)
 Pt BIB [facility] from close by her home where she was seen by a bystander laying supine in the middle of the rd. EMS reports she was initially unresponsive so they cute her sleeve & started an 18g in the [facility]. Pt then woke up & was irate they cut her sleeve & angry that her boyfriend has her walker. EMS endorses before the event her bf reports last seeing her at their home at 1330 with no issues noted. Upon arrival to ED pt alert to baseline & yelling about her sleeve & walker, does have an extensive psych Hx, VSS.

## 2024-07-20 NOTE — ED Notes (Signed)
 Pt removed her own IV & walked out to lobby leaving AMA before triage completed.

## 2024-07-20 NOTE — ED Notes (Signed)
 Pt removed IV and refused vitals. Pt stated she is about to leave
